# Patient Record
Sex: Male | Born: 1945 | Race: White | Hispanic: No | Smoking: Never smoker
Health system: Southern US, Community
[De-identification: ages and names within clinical notes are randomized; demographics above are authoritative.]

## PROBLEM LIST (undated history)

## (undated) DIAGNOSIS — J449 Chronic obstructive pulmonary disease, unspecified: Secondary | ICD-10-CM

## (undated) DIAGNOSIS — T7840XA Allergy, unspecified, initial encounter: Secondary | ICD-10-CM

## (undated) DIAGNOSIS — Z860100 Personal history of colon polyps, unspecified: Secondary | ICD-10-CM

## (undated) DIAGNOSIS — K219 Gastro-esophageal reflux disease without esophagitis: Secondary | ICD-10-CM

## (undated) DIAGNOSIS — Z809 Family history of malignant neoplasm, unspecified: Secondary | ICD-10-CM

## (undated) DIAGNOSIS — M1990 Unspecified osteoarthritis, unspecified site: Secondary | ICD-10-CM

## (undated) DIAGNOSIS — F419 Anxiety disorder, unspecified: Secondary | ICD-10-CM

## (undated) DIAGNOSIS — I509 Heart failure, unspecified: Secondary | ICD-10-CM

## (undated) DIAGNOSIS — M542 Cervicalgia: Secondary | ICD-10-CM

## (undated) DIAGNOSIS — F1110 Opioid abuse, uncomplicated: Secondary | ICD-10-CM

## (undated) DIAGNOSIS — C4372 Malignant melanoma of left lower limb, including hip: Secondary | ICD-10-CM

## (undated) DIAGNOSIS — R112 Nausea with vomiting, unspecified: Secondary | ICD-10-CM

## (undated) DIAGNOSIS — F329 Major depressive disorder, single episode, unspecified: Secondary | ICD-10-CM

## (undated) DIAGNOSIS — I219 Acute myocardial infarction, unspecified: Secondary | ICD-10-CM

## (undated) DIAGNOSIS — F1020 Alcohol dependence, uncomplicated: Secondary | ICD-10-CM

## (undated) DIAGNOSIS — D500 Iron deficiency anemia secondary to blood loss (chronic): Secondary | ICD-10-CM

## (undated) DIAGNOSIS — K5790 Diverticulosis of intestine, part unspecified, without perforation or abscess without bleeding: Secondary | ICD-10-CM

## (undated) DIAGNOSIS — I2510 Atherosclerotic heart disease of native coronary artery without angina pectoris: Secondary | ICD-10-CM

## (undated) DIAGNOSIS — A499 Bacterial infection, unspecified: Secondary | ICD-10-CM

## (undated) DIAGNOSIS — R600 Localized edema: Secondary | ICD-10-CM

## (undated) DIAGNOSIS — Z9889 Other specified postprocedural states: Secondary | ICD-10-CM

## (undated) DIAGNOSIS — G8929 Other chronic pain: Secondary | ICD-10-CM

## (undated) DIAGNOSIS — M069 Rheumatoid arthritis, unspecified: Secondary | ICD-10-CM

## (undated) DIAGNOSIS — IMO0001 Reserved for inherently not codable concepts without codable children: Secondary | ICD-10-CM

## (undated) DIAGNOSIS — E7800 Pure hypercholesterolemia, unspecified: Secondary | ICD-10-CM

## (undated) DIAGNOSIS — M549 Dorsalgia, unspecified: Secondary | ICD-10-CM

## (undated) DIAGNOSIS — R296 Repeated falls: Secondary | ICD-10-CM

## (undated) DIAGNOSIS — F32A Depression, unspecified: Secondary | ICD-10-CM

## (undated) DIAGNOSIS — I10 Essential (primary) hypertension: Secondary | ICD-10-CM

## (undated) DIAGNOSIS — I499 Cardiac arrhythmia, unspecified: Secondary | ICD-10-CM

## (undated) DIAGNOSIS — G629 Polyneuropathy, unspecified: Secondary | ICD-10-CM

## (undated) DIAGNOSIS — Z1379 Encounter for other screening for genetic and chromosomal anomalies: Principal | ICD-10-CM

## (undated) DIAGNOSIS — R609 Edema, unspecified: Secondary | ICD-10-CM

## (undated) DIAGNOSIS — H269 Unspecified cataract: Secondary | ICD-10-CM

## (undated) DIAGNOSIS — R0600 Dyspnea, unspecified: Secondary | ICD-10-CM

## (undated) HISTORY — PX: INGUINAL HERNIA REPAIR: SUR1180

## (undated) HISTORY — PX: CARPAL TUNNEL RELEASE: SHX101

## (undated) HISTORY — PX: BONE TUMOR RESECTION: SHX1255

## (undated) HISTORY — DX: Essential (primary) hypertension: I10

## (undated) HISTORY — PX: JOINT REPLACEMENT: SHX530

## (undated) HISTORY — DX: Iron deficiency anemia secondary to blood loss (chronic): D50.0

## (undated) HISTORY — DX: Hemochromatosis, unspecified: E83.119

## (undated) HISTORY — DX: Other specified postprocedural states: Z98.890

## (undated) HISTORY — DX: Encounter for other screening for genetic and chromosomal anomalies: Z13.79

## (undated) HISTORY — DX: Unspecified cataract: H26.9

## (undated) HISTORY — DX: Chronic obstructive pulmonary disease, unspecified: J44.9

## (undated) HISTORY — PX: REPLACEMENT UNICONDYLAR JOINT KNEE: SUR1227

## (undated) HISTORY — DX: Diverticulosis of intestine, part unspecified, without perforation or abscess without bleeding: K57.90

## (undated) HISTORY — PX: SHOULDER ARTHROSCOPY: SHX128

## (undated) HISTORY — DX: Atherosclerotic heart disease of native coronary artery without angina pectoris: I25.10

## (undated) HISTORY — DX: Bacterial infection, unspecified: A49.9

## (undated) HISTORY — DX: Allergy, unspecified, initial encounter: T78.40XA

## (undated) HISTORY — PX: MECKEL DIVERTICULUM EXCISION: SHX314

## (undated) HISTORY — DX: Rheumatoid arthritis, unspecified: M06.9

## (undated) HISTORY — DX: Family history of malignant neoplasm, unspecified: Z80.9

## (undated) HISTORY — PX: ABDOMINAL ADHESION SURGERY: SHX90

## (undated) HISTORY — DX: Personal history of colonic polyps: Z86.010

## (undated) HISTORY — DX: Heart failure, unspecified: I50.9

## (undated) HISTORY — PX: TOTAL SHOULDER REPLACEMENT: SUR1217

## (undated) HISTORY — DX: Unspecified osteoarthritis, unspecified site: M19.90

## (undated) HISTORY — PX: APPENDECTOMY: SHX54

## (undated) HISTORY — DX: Personal history of colon polyps, unspecified: Z86.0100

## (undated) HISTORY — PX: KNEE SURGERY: SHX244

## (undated) HISTORY — PX: CYST EXCISION: SHX5701

---

## 1997-11-25 ENCOUNTER — Other Ambulatory Visit: Admission: RE | Admit: 1997-11-25 | Discharge: 1997-11-25 | Payer: Self-pay | Admitting: Orthopedic Surgery

## 1998-09-15 ENCOUNTER — Ambulatory Visit (HOSPITAL_COMMUNITY): Admission: RE | Admit: 1998-09-15 | Discharge: 1998-09-15 | Payer: Self-pay | Admitting: Gastroenterology

## 1998-09-18 ENCOUNTER — Ambulatory Visit (HOSPITAL_COMMUNITY): Admission: RE | Admit: 1998-09-18 | Discharge: 1998-09-18 | Payer: Self-pay | Admitting: Gastroenterology

## 1999-08-10 ENCOUNTER — Encounter: Admission: RE | Admit: 1999-08-10 | Discharge: 1999-08-10 | Payer: Self-pay | Admitting: *Deleted

## 1999-08-10 ENCOUNTER — Encounter: Payer: Self-pay | Admitting: *Deleted

## 1999-10-05 ENCOUNTER — Other Ambulatory Visit: Admission: RE | Admit: 1999-10-05 | Discharge: 1999-10-05 | Payer: Self-pay | Admitting: Orthopedic Surgery

## 2000-04-08 HISTORY — PX: TOTAL KNEE ARTHROPLASTY: SHX125

## 2001-09-18 ENCOUNTER — Encounter: Payer: Self-pay | Admitting: Orthopedic Surgery

## 2001-09-24 ENCOUNTER — Inpatient Hospital Stay (HOSPITAL_COMMUNITY): Admission: RE | Admit: 2001-09-24 | Discharge: 2001-09-26 | Payer: Self-pay | Admitting: Orthopedic Surgery

## 2001-09-24 ENCOUNTER — Encounter: Payer: Self-pay | Admitting: Orthopedic Surgery

## 2001-10-03 ENCOUNTER — Emergency Department (HOSPITAL_COMMUNITY): Admission: EM | Admit: 2001-10-03 | Discharge: 2001-10-03 | Payer: Self-pay | Admitting: Emergency Medicine

## 2001-12-24 ENCOUNTER — Encounter (INDEPENDENT_AMBULATORY_CARE_PROVIDER_SITE_OTHER): Payer: Self-pay | Admitting: Specialist

## 2001-12-24 ENCOUNTER — Inpatient Hospital Stay (HOSPITAL_COMMUNITY): Admission: RE | Admit: 2001-12-24 | Discharge: 2001-12-26 | Payer: Self-pay | Admitting: Orthopedic Surgery

## 2003-02-04 ENCOUNTER — Encounter (HOSPITAL_COMMUNITY): Admission: RE | Admit: 2003-02-04 | Discharge: 2003-05-05 | Payer: Self-pay | Admitting: Gastroenterology

## 2003-03-16 ENCOUNTER — Inpatient Hospital Stay (HOSPITAL_COMMUNITY): Admission: RE | Admit: 2003-03-16 | Discharge: 2003-03-21 | Payer: Self-pay | Admitting: Orthopedic Surgery

## 2003-09-01 ENCOUNTER — Encounter (HOSPITAL_COMMUNITY): Admission: RE | Admit: 2003-09-01 | Discharge: 2003-11-30 | Payer: Self-pay | Admitting: Gastroenterology

## 2003-09-14 LAB — HM COLONOSCOPY: HM Colonoscopy: NORMAL

## 2004-01-23 ENCOUNTER — Encounter: Admission: RE | Admit: 2004-01-23 | Discharge: 2004-01-23 | Payer: Self-pay | Admitting: Internal Medicine

## 2004-04-08 DIAGNOSIS — I219 Acute myocardial infarction, unspecified: Secondary | ICD-10-CM

## 2004-04-08 HISTORY — PX: TOTAL SHOULDER REPLACEMENT: SUR1217

## 2004-04-08 HISTORY — PX: CORONARY ANGIOPLASTY WITH STENT PLACEMENT: SHX49

## 2004-04-08 HISTORY — DX: Acute myocardial infarction, unspecified: I21.9

## 2004-10-15 ENCOUNTER — Ambulatory Visit (HOSPITAL_COMMUNITY): Admission: RE | Admit: 2004-10-15 | Discharge: 2004-10-17 | Payer: Self-pay | Admitting: *Deleted

## 2004-12-17 ENCOUNTER — Encounter: Payer: Self-pay | Admitting: Internal Medicine

## 2005-06-27 ENCOUNTER — Encounter: Admission: RE | Admit: 2005-06-27 | Discharge: 2005-06-27 | Payer: Self-pay | Admitting: Family Medicine

## 2006-04-08 HISTORY — PX: TOTAL ANKLE REPLACEMENT: SUR1218

## 2006-04-28 ENCOUNTER — Encounter: Payer: Self-pay | Admitting: Internal Medicine

## 2006-06-23 ENCOUNTER — Ambulatory Visit (HOSPITAL_COMMUNITY): Admission: RE | Admit: 2006-06-23 | Discharge: 2006-06-23 | Payer: Self-pay | Admitting: Surgery

## 2006-06-23 ENCOUNTER — Encounter (INDEPENDENT_AMBULATORY_CARE_PROVIDER_SITE_OTHER): Payer: Self-pay | Admitting: *Deleted

## 2006-06-30 ENCOUNTER — Encounter: Payer: Self-pay | Admitting: Internal Medicine

## 2006-08-12 ENCOUNTER — Inpatient Hospital Stay (HOSPITAL_COMMUNITY): Admission: RE | Admit: 2006-08-12 | Discharge: 2006-08-15 | Payer: Self-pay | Admitting: Orthopedic Surgery

## 2006-08-25 ENCOUNTER — Encounter: Payer: Self-pay | Admitting: Internal Medicine

## 2006-11-17 ENCOUNTER — Encounter: Payer: Self-pay | Admitting: Internal Medicine

## 2006-12-09 ENCOUNTER — Ambulatory Visit (HOSPITAL_COMMUNITY): Admission: RE | Admit: 2006-12-09 | Discharge: 2006-12-09 | Payer: Self-pay | Admitting: *Deleted

## 2006-12-22 ENCOUNTER — Encounter: Payer: Self-pay | Admitting: Internal Medicine

## 2007-01-22 ENCOUNTER — Encounter: Payer: Self-pay | Admitting: Internal Medicine

## 2007-02-07 HISTORY — PX: FOOT SURGERY: SHX648

## 2007-03-10 ENCOUNTER — Encounter: Payer: Self-pay | Admitting: Internal Medicine

## 2007-03-16 ENCOUNTER — Encounter: Payer: Self-pay | Admitting: Internal Medicine

## 2007-03-27 ENCOUNTER — Encounter: Payer: Self-pay | Admitting: Internal Medicine

## 2007-03-30 ENCOUNTER — Ambulatory Visit: Payer: Self-pay | Admitting: Internal Medicine

## 2007-03-30 DIAGNOSIS — Z860100 Personal history of colon polyps, unspecified: Secondary | ICD-10-CM | POA: Insufficient documentation

## 2007-03-30 DIAGNOSIS — I2510 Atherosclerotic heart disease of native coronary artery without angina pectoris: Secondary | ICD-10-CM | POA: Insufficient documentation

## 2007-03-30 DIAGNOSIS — I10 Essential (primary) hypertension: Secondary | ICD-10-CM | POA: Insufficient documentation

## 2007-03-30 DIAGNOSIS — J309 Allergic rhinitis, unspecified: Secondary | ICD-10-CM | POA: Insufficient documentation

## 2007-03-30 DIAGNOSIS — E785 Hyperlipidemia, unspecified: Secondary | ICD-10-CM | POA: Insufficient documentation

## 2007-03-30 DIAGNOSIS — M159 Polyosteoarthritis, unspecified: Secondary | ICD-10-CM | POA: Insufficient documentation

## 2007-03-30 DIAGNOSIS — Z862 Personal history of diseases of the blood and blood-forming organs and certain disorders involving the immune mechanism: Secondary | ICD-10-CM | POA: Insufficient documentation

## 2007-03-30 DIAGNOSIS — Z8719 Personal history of other diseases of the digestive system: Secondary | ICD-10-CM | POA: Insufficient documentation

## 2007-04-06 ENCOUNTER — Encounter: Payer: Self-pay | Admitting: Internal Medicine

## 2007-04-08 ENCOUNTER — Ambulatory Visit: Payer: Self-pay | Admitting: Internal Medicine

## 2007-04-08 LAB — CONVERTED CEMR LAB
ALT: 16 units/L (ref 0–53)
AST: 15 units/L (ref 0–37)
Albumin: 4.2 g/dL (ref 3.5–5.2)
Alkaline Phosphatase: 73 units/L (ref 39–117)
BUN: 10 mg/dL (ref 6–23)
Basophils Absolute: 0 10*3/uL (ref 0.0–0.1)
Basophils Relative: 0.8 % (ref 0.0–1.0)
Bilirubin, Direct: 0.2 mg/dL (ref 0.0–0.3)
CO2: 32 meq/L (ref 19–32)
Calcium: 9.6 mg/dL (ref 8.4–10.5)
Chloride: 97 meq/L (ref 96–112)
Cholesterol: 132 mg/dL (ref 0–200)
Creatinine, Ser: 0.6 mg/dL (ref 0.4–1.5)
Eosinophils Absolute: 0.2 10*3/uL (ref 0.0–0.6)
Eosinophils Relative: 3 % (ref 0.0–5.0)
GFR calc Af Amer: 177 mL/min
GFR calc non Af Amer: 147 mL/min
Glucose, Bld: 108 mg/dL — ABNORMAL HIGH (ref 70–99)
HCT: 45.6 % (ref 39.0–52.0)
HDL: 44 mg/dL (ref >39.0)
Hemoglobin: 16.1 g/dL (ref 13.0–17.0)
Iron: 109 ug/dL (ref 42–165)
LDL Cholesterol: 80 mg/dL (ref 0–99)
Lymphocytes Relative: 20.7 % (ref 12.0–46.0)
MCHC: 35.2 g/dL (ref 30.0–36.0)
MCV: 96 fL (ref 78.0–100.0)
Monocytes Absolute: 0.9 10*3/uL — ABNORMAL HIGH (ref 0.2–0.7)
Monocytes Relative: 15.1 % — ABNORMAL HIGH (ref 3.0–11.0)
Neutro Abs: 3.7 10*3/uL (ref 1.4–7.7)
Neutrophils Relative %: 60.4 % (ref 43.0–77.0)
Platelets: 317 10*3/uL (ref 150–400)
Potassium: 4 meq/L (ref 3.5–5.1)
RBC: 4.75 M/uL (ref 4.22–5.81)
RDW: 12.2 % (ref 11.5–14.6)
Sodium: 137 meq/L (ref 135–145)
Total Bilirubin: 0.8 mg/dL (ref 0.3–1.2)
Total CHOL/HDL Ratio: 3
Total Protein: 7.6 g/dL (ref 6.0–8.3)
Triglycerides: 38 mg/dL (ref 0–149)
VLDL: 8 mg/dL (ref 0–40)
Vitamin B-12: 478 pg/mL (ref 211–911)
WBC: 6 10*3/uL (ref 4.5–10.5)

## 2007-04-11 ENCOUNTER — Encounter: Payer: Self-pay | Admitting: Internal Medicine

## 2007-04-15 ENCOUNTER — Telehealth: Payer: Self-pay | Admitting: Internal Medicine

## 2007-04-17 ENCOUNTER — Ambulatory Visit: Payer: Self-pay | Admitting: Internal Medicine

## 2007-04-17 DIAGNOSIS — M79609 Pain in unspecified limb: Secondary | ICD-10-CM | POA: Insufficient documentation

## 2007-04-17 LAB — CONVERTED CEMR LAB: Uric Acid, Serum: 3.8 mg/dL (ref 2.4–7.0)

## 2007-04-20 ENCOUNTER — Telehealth: Payer: Self-pay | Admitting: Internal Medicine

## 2007-05-21 ENCOUNTER — Ambulatory Visit (HOSPITAL_BASED_OUTPATIENT_CLINIC_OR_DEPARTMENT_OTHER): Admission: RE | Admit: 2007-05-21 | Discharge: 2007-05-21 | Payer: Self-pay | Admitting: Orthopedic Surgery

## 2007-05-21 ENCOUNTER — Encounter (INDEPENDENT_AMBULATORY_CARE_PROVIDER_SITE_OTHER): Payer: Self-pay | Admitting: Orthopedic Surgery

## 2007-06-26 ENCOUNTER — Ambulatory Visit: Payer: Self-pay | Admitting: Internal Medicine

## 2007-09-07 HISTORY — PX: ANKLE RECONSTRUCTION: SHX1151

## 2007-09-08 ENCOUNTER — Telehealth: Payer: Self-pay | Admitting: Internal Medicine

## 2007-10-06 ENCOUNTER — Telehealth: Payer: Self-pay | Admitting: Internal Medicine

## 2007-11-09 ENCOUNTER — Ambulatory Visit: Payer: Self-pay | Admitting: Internal Medicine

## 2007-11-09 DIAGNOSIS — F419 Anxiety disorder, unspecified: Secondary | ICD-10-CM | POA: Insufficient documentation

## 2007-12-03 ENCOUNTER — Encounter: Payer: Self-pay | Admitting: Internal Medicine

## 2007-12-04 ENCOUNTER — Encounter: Payer: Self-pay | Admitting: Internal Medicine

## 2007-12-11 ENCOUNTER — Telehealth: Payer: Self-pay | Admitting: Endocrinology

## 2007-12-15 ENCOUNTER — Ambulatory Visit: Payer: Self-pay | Admitting: Internal Medicine

## 2007-12-15 LAB — CONVERTED CEMR LAB
Chlamydia, Swab/Urine, PCR: NEGATIVE
GC Probe Amp, Urine: NEGATIVE

## 2007-12-17 ENCOUNTER — Encounter: Payer: Self-pay | Admitting: Internal Medicine

## 2007-12-25 ENCOUNTER — Telehealth: Payer: Self-pay | Admitting: Internal Medicine

## 2008-01-01 ENCOUNTER — Ambulatory Visit: Payer: Self-pay | Admitting: Internal Medicine

## 2008-01-07 ENCOUNTER — Encounter: Payer: Self-pay | Admitting: Internal Medicine

## 2008-01-08 ENCOUNTER — Ambulatory Visit: Payer: Self-pay | Admitting: Internal Medicine

## 2008-01-11 ENCOUNTER — Encounter: Payer: Self-pay | Admitting: Internal Medicine

## 2008-01-18 ENCOUNTER — Encounter: Payer: Self-pay | Admitting: Internal Medicine

## 2008-02-05 ENCOUNTER — Encounter: Payer: Self-pay | Admitting: Internal Medicine

## 2008-02-23 ENCOUNTER — Telehealth: Payer: Self-pay | Admitting: Internal Medicine

## 2008-02-29 ENCOUNTER — Encounter: Payer: Self-pay | Admitting: Internal Medicine

## 2008-04-15 ENCOUNTER — Telehealth: Payer: Self-pay | Admitting: Internal Medicine

## 2008-04-18 ENCOUNTER — Telehealth: Payer: Self-pay | Admitting: Internal Medicine

## 2008-04-28 ENCOUNTER — Telehealth (INDEPENDENT_AMBULATORY_CARE_PROVIDER_SITE_OTHER): Payer: Self-pay | Admitting: *Deleted

## 2008-05-02 ENCOUNTER — Encounter: Payer: Self-pay | Admitting: Internal Medicine

## 2008-05-02 ENCOUNTER — Telehealth: Payer: Self-pay | Admitting: Internal Medicine

## 2008-05-05 ENCOUNTER — Encounter: Payer: Self-pay | Admitting: Internal Medicine

## 2008-05-11 ENCOUNTER — Encounter: Payer: Self-pay | Admitting: Internal Medicine

## 2008-06-06 ENCOUNTER — Telehealth: Payer: Self-pay | Admitting: Internal Medicine

## 2008-06-21 ENCOUNTER — Telehealth: Payer: Self-pay | Admitting: Internal Medicine

## 2008-07-04 ENCOUNTER — Encounter: Payer: Self-pay | Admitting: Internal Medicine

## 2008-07-18 ENCOUNTER — Encounter: Payer: Self-pay | Admitting: Internal Medicine

## 2008-08-29 ENCOUNTER — Telehealth: Payer: Self-pay | Admitting: Internal Medicine

## 2008-10-03 ENCOUNTER — Telehealth: Payer: Self-pay | Admitting: Internal Medicine

## 2008-10-11 ENCOUNTER — Ambulatory Visit: Payer: Self-pay | Admitting: Internal Medicine

## 2008-10-11 ENCOUNTER — Telehealth: Payer: Self-pay | Admitting: Internal Medicine

## 2008-10-11 LAB — CONVERTED CEMR LAB
ALT: 14 units/L (ref 0–53)
AST: 17 units/L (ref 0–37)
Albumin: 4.1 g/dL (ref 3.5–5.2)
Alkaline Phosphatase: 76 units/L (ref 39–117)
BUN: 12 mg/dL (ref 6–23)
Basophils Absolute: 0 10*3/uL (ref 0.0–0.1)
Basophils Relative: 0.2 % (ref 0.0–3.0)
Bilirubin Urine: NEGATIVE
Bilirubin, Direct: 0.2 mg/dL (ref 0.0–0.3)
CO2: 33 meq/L — ABNORMAL HIGH (ref 19–32)
Calcium: 8.9 mg/dL (ref 8.4–10.5)
Chloride: 97 meq/L (ref 96–112)
Cholesterol: 144 mg/dL (ref 0–200)
Creatinine, Ser: 0.7 mg/dL (ref 0.4–1.5)
Eosinophils Absolute: 0.1 10*3/uL (ref 0.0–0.7)
Eosinophils Relative: 2.4 % (ref 0.0–5.0)
Ferritin: 43 ng/mL (ref 22.0–322.0)
GFR calc non Af Amer: 121.67 mL/min (ref >60)
Glucose, Bld: 112 mg/dL — ABNORMAL HIGH (ref 70–99)
HCT: 43.2 % (ref 39.0–52.0)
HDL: 49 mg/dL (ref >39.00)
Hemoglobin, Urine: NEGATIVE
Hemoglobin: 15 g/dL (ref 13.0–17.0)
Ketones, ur: NEGATIVE mg/dL
LDL Cholesterol: 89 mg/dL (ref 0–99)
Leukocytes, UA: NEGATIVE
Lymphocytes Relative: 20.2 % (ref 12.0–46.0)
Lymphs Abs: 0.8 10*3/uL (ref 0.7–4.0)
MCHC: 34.6 g/dL (ref 30.0–36.0)
MCV: 93.2 fL (ref 78.0–100.0)
Monocytes Absolute: 0.4 10*3/uL (ref 0.1–1.0)
Monocytes Relative: 10.3 % (ref 3.0–12.0)
Neutro Abs: 2.7 10*3/uL (ref 1.4–7.7)
Neutrophils Relative %: 66.9 % (ref 43.0–77.0)
Nitrite: NEGATIVE
PSA: 1.64 ng/mL (ref 0.10–4.00)
Platelets: 206 10*3/uL (ref 150.0–400.0)
Potassium: 4.1 meq/L (ref 3.5–5.1)
RBC: 4.64 M/uL (ref 4.22–5.81)
RDW: 12.7 % (ref 11.5–14.6)
Sodium: 138 meq/L (ref 135–145)
Specific Gravity, Urine: <=1.005 (ref 1.000–1.030)
TSH: 0.54 microintl units/mL (ref 0.35–5.50)
Total Bilirubin: 1 mg/dL (ref 0.3–1.2)
Total CHOL/HDL Ratio: 3
Total Protein, Urine: NEGATIVE mg/dL
Total Protein: 6.9 g/dL (ref 6.0–8.3)
Triglycerides: 28 mg/dL (ref 0.0–149.0)
Urine Glucose: NEGATIVE mg/dL
Urobilinogen, UA: 0.2 (ref 0.0–1.0)
VLDL: 5.6 mg/dL (ref 0.0–40.0)
WBC: 4 10*3/uL — ABNORMAL LOW (ref 4.5–10.5)
pH: 6.5 (ref 5.0–8.0)

## 2008-10-13 ENCOUNTER — Encounter: Payer: Self-pay | Admitting: Internal Medicine

## 2008-10-17 ENCOUNTER — Ambulatory Visit: Payer: Self-pay | Admitting: Internal Medicine

## 2008-10-17 ENCOUNTER — Telehealth: Payer: Self-pay | Admitting: Internal Medicine

## 2008-10-18 DIAGNOSIS — M069 Rheumatoid arthritis, unspecified: Secondary | ICD-10-CM | POA: Insufficient documentation

## 2008-10-24 ENCOUNTER — Encounter: Payer: Self-pay | Admitting: Internal Medicine

## 2008-10-26 ENCOUNTER — Telehealth: Payer: Self-pay | Admitting: Internal Medicine

## 2008-10-26 DIAGNOSIS — M51379 Other intervertebral disc degeneration, lumbosacral region without mention of lumbar back pain or lower extremity pain: Secondary | ICD-10-CM | POA: Insufficient documentation

## 2008-10-26 DIAGNOSIS — M5137 Other intervertebral disc degeneration, lumbosacral region: Secondary | ICD-10-CM | POA: Insufficient documentation

## 2008-10-31 ENCOUNTER — Telehealth: Payer: Self-pay | Admitting: Internal Medicine

## 2008-11-03 ENCOUNTER — Telehealth (INDEPENDENT_AMBULATORY_CARE_PROVIDER_SITE_OTHER): Payer: Self-pay | Admitting: *Deleted

## 2008-11-03 ENCOUNTER — Encounter: Payer: Self-pay | Admitting: Internal Medicine

## 2008-11-07 ENCOUNTER — Encounter: Admission: RE | Admit: 2008-11-07 | Discharge: 2008-11-07 | Payer: Self-pay | Admitting: Internal Medicine

## 2008-11-18 ENCOUNTER — Telehealth: Payer: Self-pay | Admitting: Internal Medicine

## 2008-11-30 ENCOUNTER — Encounter: Payer: Self-pay | Admitting: Internal Medicine

## 2008-12-01 ENCOUNTER — Encounter: Payer: Self-pay | Admitting: Internal Medicine

## 2008-12-01 ENCOUNTER — Encounter: Admission: RE | Admit: 2008-12-01 | Discharge: 2008-12-01 | Payer: Self-pay | Admitting: Orthopedic Surgery

## 2008-12-08 ENCOUNTER — Encounter: Payer: Self-pay | Admitting: Internal Medicine

## 2008-12-13 ENCOUNTER — Telehealth: Payer: Self-pay | Admitting: Internal Medicine

## 2009-01-03 ENCOUNTER — Telehealth: Payer: Self-pay | Admitting: Internal Medicine

## 2009-01-09 ENCOUNTER — Telehealth (INDEPENDENT_AMBULATORY_CARE_PROVIDER_SITE_OTHER): Payer: Self-pay | Admitting: *Deleted

## 2009-01-16 ENCOUNTER — Telehealth: Payer: Self-pay | Admitting: Internal Medicine

## 2009-02-14 ENCOUNTER — Ambulatory Visit: Payer: Self-pay | Admitting: Cardiology

## 2009-03-08 HISTORY — PX: POSTERIOR LUMBAR FUSION: SHX6036

## 2009-03-15 ENCOUNTER — Telehealth: Payer: Self-pay | Admitting: Internal Medicine

## 2009-03-21 ENCOUNTER — Inpatient Hospital Stay (HOSPITAL_COMMUNITY): Admission: RE | Admit: 2009-03-21 | Discharge: 2009-03-31 | Payer: Self-pay | Admitting: Neurosurgery

## 2009-03-27 ENCOUNTER — Ambulatory Visit: Payer: Self-pay | Admitting: Physical Medicine & Rehabilitation

## 2009-04-06 ENCOUNTER — Encounter: Admission: RE | Admit: 2009-04-06 | Discharge: 2009-04-06 | Payer: Self-pay | Admitting: Neurosurgery

## 2009-04-06 ENCOUNTER — Ambulatory Visit: Payer: Self-pay | Admitting: Internal Medicine

## 2009-04-06 DIAGNOSIS — IMO0002 Reserved for concepts with insufficient information to code with codable children: Secondary | ICD-10-CM | POA: Insufficient documentation

## 2009-04-07 ENCOUNTER — Encounter: Payer: Self-pay | Admitting: Internal Medicine

## 2009-04-07 ENCOUNTER — Ambulatory Visit (HOSPITAL_COMMUNITY): Admission: RE | Admit: 2009-04-07 | Discharge: 2009-04-07 | Payer: Self-pay | Admitting: Internal Medicine

## 2009-04-07 ENCOUNTER — Ambulatory Visit: Payer: Self-pay | Admitting: Vascular Surgery

## 2009-04-10 ENCOUNTER — Ambulatory Visit: Payer: Self-pay | Admitting: Internal Medicine

## 2009-04-10 DIAGNOSIS — I872 Venous insufficiency (chronic) (peripheral): Secondary | ICD-10-CM | POA: Insufficient documentation

## 2009-04-13 ENCOUNTER — Encounter: Admission: RE | Admit: 2009-04-13 | Discharge: 2009-06-27 | Payer: Self-pay | Admitting: Neurosurgery

## 2009-04-20 ENCOUNTER — Encounter: Payer: Self-pay | Admitting: Internal Medicine

## 2009-04-28 ENCOUNTER — Telehealth: Payer: Self-pay | Admitting: Internal Medicine

## 2009-05-01 ENCOUNTER — Telehealth: Payer: Self-pay | Admitting: Internal Medicine

## 2009-05-05 ENCOUNTER — Telehealth (INDEPENDENT_AMBULATORY_CARE_PROVIDER_SITE_OTHER): Payer: Self-pay | Admitting: *Deleted

## 2009-05-17 ENCOUNTER — Ambulatory Visit: Payer: Self-pay | Admitting: Internal Medicine

## 2009-06-09 ENCOUNTER — Encounter: Payer: Self-pay | Admitting: Internal Medicine

## 2009-06-09 ENCOUNTER — Ambulatory Visit: Payer: Self-pay | Admitting: Internal Medicine

## 2009-06-09 ENCOUNTER — Telehealth: Payer: Self-pay | Admitting: Internal Medicine

## 2009-06-12 ENCOUNTER — Encounter: Payer: Self-pay | Admitting: Internal Medicine

## 2009-06-26 ENCOUNTER — Telehealth: Payer: Self-pay | Admitting: Internal Medicine

## 2009-07-17 ENCOUNTER — Telehealth: Payer: Self-pay | Admitting: Internal Medicine

## 2009-10-06 ENCOUNTER — Ambulatory Visit: Payer: Self-pay | Admitting: Internal Medicine

## 2009-10-06 LAB — CONVERTED CEMR LAB
ALT: 16 units/L (ref 0–53)
AST: 17 units/L (ref 0–37)
Albumin: 3.8 g/dL (ref 3.5–5.2)
Alkaline Phosphatase: 79 units/L (ref 39–117)
BUN: 12 mg/dL (ref 6–23)
Basophils Absolute: 0 10*3/uL (ref 0.0–0.1)
Basophils Relative: 0.9 % (ref 0.0–3.0)
Bilirubin Urine: NEGATIVE
Bilirubin, Direct: 0.2 mg/dL (ref 0.0–0.3)
CO2: 30 meq/L (ref 19–32)
Calcium: 8.7 mg/dL (ref 8.4–10.5)
Chloride: 102 meq/L (ref 96–112)
Cholesterol: 134 mg/dL (ref 0–200)
Creatinine, Ser: 0.6 mg/dL (ref 0.4–1.5)
Eosinophils Absolute: 0.1 10*3/uL (ref 0.0–0.7)
Eosinophils Relative: 2.4 % (ref 0.0–5.0)
GFR calc non Af Amer: 144.88 mL/min (ref >60)
Glucose, Bld: 101 mg/dL — ABNORMAL HIGH (ref 70–99)
HCT: 39.3 % (ref 39.0–52.0)
HDL: 43.9 mg/dL (ref >39.00)
Hemoglobin, Urine: NEGATIVE
Hemoglobin: 13.5 g/dL (ref 13.0–17.0)
Ketones, ur: NEGATIVE mg/dL
LDL Cholesterol: 82 mg/dL (ref 0–99)
Leukocytes, UA: NEGATIVE
Lymphocytes Relative: 18.5 % (ref 12.0–46.0)
Lymphs Abs: 0.8 10*3/uL (ref 0.7–4.0)
MCHC: 34.3 g/dL (ref 30.0–36.0)
MCV: 93.1 fL (ref 78.0–100.0)
Monocytes Absolute: 0.5 10*3/uL (ref 0.1–1.0)
Monocytes Relative: 11.2 % (ref 3.0–12.0)
Neutro Abs: 2.9 10*3/uL (ref 1.4–7.7)
Neutrophils Relative %: 67 % (ref 43.0–77.0)
Nitrite: NEGATIVE
PSA: 1.77 ng/mL (ref 0.10–4.00)
Platelets: 271 10*3/uL (ref 150.0–400.0)
Potassium: 4.1 meq/L (ref 3.5–5.1)
RBC: 4.22 M/uL (ref 4.22–5.81)
RDW: 15.4 % — ABNORMAL HIGH (ref 11.5–14.6)
Sodium: 139 meq/L (ref 135–145)
Specific Gravity, Urine: <=1.005 (ref 1.000–1.030)
TSH: 0.6 microintl units/mL (ref 0.35–5.50)
Total Bilirubin: 0.6 mg/dL (ref 0.3–1.2)
Total CHOL/HDL Ratio: 3
Total Protein, Urine: NEGATIVE mg/dL
Total Protein: 6.5 g/dL (ref 6.0–8.3)
Triglycerides: 40 mg/dL (ref 0.0–149.0)
Urine Glucose: NEGATIVE mg/dL
Urobilinogen, UA: 0.2 (ref 0.0–1.0)
VLDL: 8 mg/dL (ref 0.0–40.0)
WBC: 4.3 10*3/uL — ABNORMAL LOW (ref 4.5–10.5)
pH: 7 (ref 5.0–8.0)

## 2009-10-13 ENCOUNTER — Encounter: Payer: Self-pay | Admitting: Internal Medicine

## 2009-10-16 ENCOUNTER — Ambulatory Visit: Payer: Self-pay | Admitting: Internal Medicine

## 2009-11-10 ENCOUNTER — Telehealth: Payer: Self-pay | Admitting: Internal Medicine

## 2009-11-17 ENCOUNTER — Telehealth: Payer: Self-pay | Admitting: Internal Medicine

## 2009-12-19 ENCOUNTER — Encounter: Payer: Self-pay | Admitting: Internal Medicine

## 2010-01-08 ENCOUNTER — Telehealth: Payer: Self-pay | Admitting: Internal Medicine

## 2010-01-10 ENCOUNTER — Ambulatory Visit: Payer: Self-pay | Admitting: Internal Medicine

## 2010-01-10 ENCOUNTER — Encounter: Payer: Self-pay | Admitting: Internal Medicine

## 2010-01-10 DIAGNOSIS — J449 Chronic obstructive pulmonary disease, unspecified: Secondary | ICD-10-CM | POA: Insufficient documentation

## 2010-01-10 DIAGNOSIS — J4489 Other specified chronic obstructive pulmonary disease: Secondary | ICD-10-CM | POA: Insufficient documentation

## 2010-01-12 ENCOUNTER — Telehealth: Payer: Self-pay | Admitting: Internal Medicine

## 2010-01-19 ENCOUNTER — Telehealth: Payer: Self-pay | Admitting: Internal Medicine

## 2010-01-23 ENCOUNTER — Ambulatory Visit: Payer: Self-pay | Admitting: Internal Medicine

## 2010-01-23 LAB — PULMONARY FUNCTION TEST

## 2010-01-25 ENCOUNTER — Encounter: Payer: Self-pay | Admitting: Internal Medicine

## 2010-01-26 ENCOUNTER — Telehealth: Payer: Self-pay | Admitting: Internal Medicine

## 2010-02-09 ENCOUNTER — Encounter: Payer: Self-pay | Admitting: Internal Medicine

## 2010-02-12 ENCOUNTER — Encounter: Payer: Self-pay | Admitting: Internal Medicine

## 2010-02-14 ENCOUNTER — Encounter: Payer: Self-pay | Admitting: Internal Medicine

## 2010-02-14 ENCOUNTER — Telehealth (INDEPENDENT_AMBULATORY_CARE_PROVIDER_SITE_OTHER): Payer: Self-pay | Admitting: *Deleted

## 2010-04-06 ENCOUNTER — Encounter: Payer: Self-pay | Admitting: Internal Medicine

## 2010-04-18 ENCOUNTER — Telehealth: Payer: Self-pay | Admitting: Internal Medicine

## 2010-04-30 ENCOUNTER — Ambulatory Visit
Admission: RE | Admit: 2010-04-30 | Discharge: 2010-04-30 | Payer: Self-pay | Source: Home / Self Care | Attending: Internal Medicine | Admitting: Internal Medicine

## 2010-05-04 ENCOUNTER — Telehealth: Payer: Self-pay | Admitting: Internal Medicine

## 2010-05-08 NOTE — Letter (Signed)
Summary: [facility]   Imported By: Bubba Hales 07/07/2009 09:29:44  _____________________________________________________________________  External Attachment:    Type:   Image     Comment:   External Document

## 2010-05-08 NOTE — Progress Notes (Signed)
Summary: Alprazolam  Phone Note Refill Request  on October 03, 2008 11:23 AM  Refills Requested: Medication #1:  ALPRAZOLAM XR 1 MG  XR24H-TAB 1 by mouth once daily   Last Refilled: 09/03/2008 Refill Request from pharmacy. last office visit 01-08-2008  [phone]  Initial call taken by: Dwana Curd, [place],  October 03, 2008 11:23 AM  Follow-up for Phone Call        OK for refill x 5 Follow-up by: Neena Rhymes MD,  October 04, 2008 9:17 AM      Prescriptions: ALPRAZOLAM XR 1 MG  XR24H-TAB (ALPRAZOLAM) 1 by mouth once daily  #30 x 5   Entered by:   Dwana Curd, CMA   Authorized by:   Neena Rhymes MD   Signed by:   Dwana Curd, CMA on 10/04/2008   Method used:   Telephoned to ...       [facility] [phone]* (retail)       [address]       [place], [place]  [address]       Ph: [phone]       Fax: [phone]   RxID:   [id number]

## 2010-05-08 NOTE — Letter (Signed)
Summary: [facility]   Imported By: Jerrye Noble D'jimraou 04/17/2007 14:37:48  _____________________________________________________________________  External Attachment:    Type:   Image     Comment:   External Document

## 2010-05-08 NOTE — Progress Notes (Signed)
Summary: REFILLS   Phone Note Refill Request Call back at [phone]   Refills Requested: Medication #1:  ALPRAZOLAM XR 1 MG  XR24H-TAB 1 by mouth once daily  Medication #2:  ALPRAZOLAM 0.5 MG TABS 1 by mouth once daily as needed.  Medication #3:  OXYCONTIN 10 MG XR12H-TAB 1 by mouth three times a day  Medication #4:  OXYCODONE-ACETAMINOPHEN 5-325 MG TABS 1 q 6 hours as needed breakthru pain - to fill mar 4 Initial call taken by: Charlsie Quest, [place],  July 17, 2009 9:37 AM  Follow-up for Phone Call        ok for Rx x 3 months on all 4 Rxs Follow-up by: Neena Rhymes MD,  July 18, 2009 5:27 AM  Additional Follow-up for Phone Call Additional follow up Details #1::        Hospital discharge summary printed out and prescriptions printed out. MD signed and both were left up front in cabinet. Pt informed and will pick up today. Additional Follow-up by: Ami Bullins CMA,  July 18, 2009 9:33 AM    New/Updated Medications: OXYCONTIN 10 MG XR12H-TAB (OXYCODONE HCL) 1 by mouth three times a day fill on or after 09/17/2009 OXYCONTIN 10 MG XR12H-TAB (OXYCODONE HCL) 1 by mouth three times a day fill on or after 07/18/2009 OXYCONTIN 10 MG XR12H-TAB (OXYCODONE HCL) 1 by mouth three times a day fill on or after 08/17/2009 OXYCODONE-ACETAMINOPHEN 5-325 MG TABS (OXYCODONE-ACETAMINOPHEN) 1 q 6 hours as needed breakthru pain  fill on or after 08/17/2009 OXYCODONE-ACETAMINOPHEN 5-325 MG TABS (OXYCODONE-ACETAMINOPHEN) 1 q 6 hours as needed breakthru pain  fill on or after 07/18/2009 OXYCODONE-ACETAMINOPHEN 5-325 MG TABS (OXYCODONE-ACETAMINOPHEN) 1 q 6 hours as needed breakthru pain  fill on or after 09/17/2009 ALPRAZOLAM 0.5 MG TABS (ALPRAZOLAM) 1 by mouth once daily as needed. Prescriptions: OXYCODONE-ACETAMINOPHEN 5-325 MG TABS (OXYCODONE-ACETAMINOPHEN) 1 q 6 hours as needed breakthru pain  fill on or after 09/17/2009  #90 x 0   Entered by:   Ami Bullins CMA   Authorized by:   Neena Rhymes MD   Signed by:   Charlynne Cousins CMA on 07/18/2009   Method used:   Print then Give to Patient   RxID:   [id number] OXYCODONE-ACETAMINOPHEN 5-325 MG TABS (OXYCODONE-ACETAMINOPHEN) 1 q 6 hours as needed breakthru pain  fill on or after 08/17/2009  #90 x 0   Entered by:   Ami Bullins CMA   Authorized by:   Neena Rhymes MD   Signed by:   Charlynne Cousins CMA on 07/18/2009   Method used:   Print then Give to Patient   RxID:   [id number] OXYCODONE-ACETAMINOPHEN 5-325 MG TABS (OXYCODONE-ACETAMINOPHEN) 1 q 6 hours as needed breakthru pain  fill on or after 07/18/2009  #90 x 0   Entered by:   Ami Bullins CMA   Authorized by:   Neena Rhymes MD   Signed by:   Charlynne Cousins CMA on 07/18/2009   Method used:   Print then Give to Patient   RxID:   [id number] OXYCONTIN 10 MG XR12H-TAB (OXYCODONE HCL) 1 by mouth three times a day fill on or after 09/17/2009  #90 x 0   Entered by:   Ami Bullins CMA   Authorized by:   Neena Rhymes MD   Signed by:   Charlynne Cousins CMA on 07/18/2009   Method used:   Print then Give to Patient   RxID:   [id number] OXYCONTIN 10 MG XR12H-TAB (OXYCODONE HCL) 1  by mouth three times a day fill on or after 08/17/2009  #90 x 0   Entered by:   Ami Bullins CMA   Authorized by:   Neena Rhymes MD   Signed by:   Charlynne Cousins CMA on 07/18/2009   Method used:   Print then Give to Patient   RxID:   [id number] OXYCONTIN 10 MG XR12H-TAB (OXYCODONE HCL) 1 by mouth three times a day fill on or after 07/18/2009  #90 x 0   Entered by:   Ami Bullins CMA   Authorized by:   Neena Rhymes MD   Signed by:   Charlynne Cousins CMA on 07/18/2009   Method used:   Print then Give to Patient   RxID:   [id number] ALPRAZOLAM 0.5 MG TABS (ALPRAZOLAM) 1 by mouth once daily as needed.  #90 x 3   Entered by:   Charlynne Cousins CMA   Authorized by:   Neena Rhymes MD   Signed by:   Charlynne Cousins CMA on 07/18/2009   Method used:   Print then Give to Patient   RxID:    [id number] ALPRAZOLAM XR 1 MG  XR24H-TAB (ALPRAZOLAM) 1 by mouth once daily  #30 x 3   Entered by:   Ami Bullins CMA   Authorized by:   Neena Rhymes MD   Signed by:   Charlynne Cousins CMA on 07/18/2009   Method used:   Print then Give to Patient   RxID:   [id number]

## 2010-05-08 NOTE — Medication Information (Signed)
Summary: Approved/[facility]  Approved/[facility]   Imported By: Bubba Hales 02/21/2010 08:18:45  _____________________________________________________________________  External Attachment:    Type:   Image     Comment:   External Document

## 2010-05-08 NOTE — Progress Notes (Signed)
  Phone Note Other Incoming   Caller: pt Summary of Call: Pt calling and states that he is having extreme pain in his left calf, Pt had an ultra sound 6 weeks ago to check for this problem and no finding of a blood clot were found. Pt had back surg on Mar 22, 2009. He is very concerned and states that his left calf is swelling with pain. What do you suggest?[phone] is his contact number Initial call taken by: Ami Bullins CMA,  June 09, 2009 11:06 AM  Follow-up for Phone Call        needs to be seen Follow-up by: Janith Lima MD,  June 09, 2009 11:21 AM     Appended Document:  Scheduled for apt with Dr Jenny Reichmann at 4:30

## 2010-05-08 NOTE — Assessment & Plan Note (Signed)
Summary: NP6/CAD/JML   Current Medications (verified): 1)  Plavix 75 Mg  Tabs (Clopidogrel Bisulfate) .... Every 2-3 Days 2)  Simvastatin 40 Mg  Tabs (Simvastatin) .... Every 2-3 Days 3)  Colchicine 0.6 Mg  Tabs (Colchicine) .... Two Times A Day 4)  Cozaar 50 Mg  Tabs (Losartan Potassium) .... Take 1 Tablet By Mouth Once A Day 5)  Hydrochlorothiazide 25 Mg  Tabs (Hydrochlorothiazide) .... 1/2 By Mouth Once Daily 6)  Celebrex 200 Mg  Caps (Celecoxib) .... Take 1 Tablet By Mouth Two Times A Day 7)  Protonix 40 Mg  Tbec (Pantoprazole Sodium) .... Take 1 Tablet By Mouth Once A Day 8)  Ambien Cr 12.5 Mg  Tbcr (Zolpidem Tartrate) .... Take 1 Tab By Mouth At Bedtime 9)  Vitamin E 400 Unit  Caps (Vitamin E) .... Take 1 Tablet By Mouth Once A Day 10)  Omega 3 Fish Oil 1000 .... Take 1 Tablet By Mouth Two Times A Day 11)  Aspirin Ec Low Dose 81 Mg  Tbec (Aspirin) .[name].. 1 Once Daily 12)  Gas-X Extra Strength 125 Mg  Caps (Simethicone) .... As Directed 13)  Oxycontin 10 Mg  Xr12h-Tab (Oxycodone Hcl) .... 2 Tabs in Am and 1 Tab in Pm For Chronic Pain Management. To Be Filled On or After 03/15/2009 14)  Alprazolam Xr 1 Mg  Xr24h-Tab (Alprazolam) .[name].. 1 By Mouth Once Daily 15)  Oxycodone Hcl 5 Mg  Tabs (Oxycodone Hcl) .[name].. 1 By Mouth Q 4 As Needed Breakthrough Pain. Fill On or After 03/28/2009 16)  Alprazolam 0.5 Mg Tabs (Alprazolam) .[name].. 1 By Mouth Once Daily As Needed. 17)  Leflunomide 10 Mg Tabs (Leflunomide) .[name].. 1 By Mouth Daily 18)  Prednisone 5 Mg Tabs (Prednisone) .[name].. 1 By Mouth Daily 19)  Multivitamins   Tabs (Multiple Vitamin) .[name].. 1 By Mouth Daily  Allergies (verified): 1)  ! Pcn  Visit Type:  Initial Consult Primary Provider:  Neena Rhymes MD  CC:  CAD.  History of Present Illness: The patient's were [place] we should read he has a history of coronary disease as described below. He status post stenting in 2006. At that time he had exertional angina. Since then he has had no further  chest discomfort. He is quite active exercising and active on the job walking quite a bit. With this he gets no chest discomfort, neck or arm discomfort. He denies any shortness of breath. He has no palpitations, presyncope or syncope. He has no PND or orthopnea. He does report having a stress test about a year ago which was normal. I reviewed lipids from earlier this year and he had an excellent ratio.   Past History:  Past Medical History: UCD Allergic rhinitis Colonic polyps, hx of Diverticulitis, hx of Hypertension Osteoarthritis Hemochromatosis: dx'd 14 years ago last ferritin Aug 11,'08 52 (22-322), Fe 136 CAD-(Minimal coronary plaque in the LAD and right coronary system.  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 July 2006.  Last nuclear stress Aug 11.'08 fixed anterior/inferior defect, no inducible ischemia, EF 81%)      . Physician Roster:         [place] surgeon -S[place]her         [place]         ortho- Bednarz/ankle, Dean-shoulder         Card - [place] ([place] [facility]-Dr. Sherryl Barters group)  GI- [name]         GUTerance Hart  Family History: Father - died 42; complications of CAD, prostate cancer, HTN Mother - 69; cancer survivor uterine, ankle edema, macular degeneration 1 brother - high Lipids, schizophrenic, dependent 1 sister - thyroid diseae Neg - colon cancer, DM, no MI.  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 Very busy life- some stress. New relationship - looks serious (Sept '09)  Review of Systems       As stated in the HPI and negative for all other systems.   Vital Signs:  Patient profile:   65year old male Height:      69 inches Weight:      167 pounds BMI:     24.75 Pulse  rate:   86 / minute Resp:     16 per minute BP sitting:   145 / 88  (right arm)  Vitals Entered By: CLevora Angel CNA (February 14, 2009 12:22 PM)  Physical Exam  General:  Well developed, well nourished, in no acute distress. Head:  normocephalic and atraumatic Eyes:  PERRLA/EOM intact; conjunctiva and lids normal. Mouth:  Teeth, gums and palate normal. Oral mucosa normal. Neck:  Neck supple, no JVD. No masses, thyromegaly or abnormal cervical nodes. Chest Wall:  no deformities or breast masses noted Lungs:  Clear bilaterally to auscultation and percussion. Abdomen:  Bowel sounds positive; abdomen soft and non-tender without masses, organomegaly, or hernias noted. No hepatosplenomegaly. Msk:  Back normal, normal gait. Muscle strength and tone normal. Extremities:  No clubbing or cyanosis. Neurologic:  Alert and oriented x 3. Skin:  Intact without lesions or rashes. Cervical Nodes:  no significant adenopathy Axillary Nodes:  no significant adenopathy Inguinal Nodes:  no significant adenopathy Psych:  Normal affect.   Detailed Cardiovascular Exam  Neck    Carotids: Carotids full and equal bilaterally without bruits.      Neck Veins: Normal, no JVD.    Heart    Inspection: no deformities or lifts noted.      Palpation: normal PMI with no thrills palpable.      Auscultation: regular rate and rhythm, S1, S2 without murmurs, rubs, gallops, or clicks.    Vascular    Abdominal Aorta: no palpable masses, pulsations, or audible bruits.      Femoral Pulses: normal femoral pulses bilaterally.      Pedal Pulses: normal pedal pulses bilaterally.      Radial Pulses: normal radial pulses bilaterally.      Peripheral Circulation: no clubbing, cyanosis, or edema noted with normal capillary refill.     EKG  Procedure date:  02/14/2009  Findings:      RAD, NSR, Poor anterior R wave progression.  No acute ST T wave changes  Impression & Recommendations:  Problem # 1:  CAD  (ICD-414.00) The patient has had no new symptoms since his catheterization. He is but dissipating and secondary risk reduction. At this time no further cardiovascular testing is suggested. Of note he may be requiring and orthopedic surgery upcoming. If so he would be at acceptable risk for the planned procedure.  Orders: EKG w/ Interpretation (93000)  Problem # 2:  DYSLIPIDEMIA (ICD-272.4) I reviewed his labs from July. He had an LDL of 89 and an HDL of 49. He will continue on the meds as listed.  Problem # 3:  HEMOCHROMATOSIS, HX OF (ICD-V12.3) He has had this followed very closely and I would suspect no cardiac involvement with his excellent treatment but will followup with echocardiograms over the years. BNP was also be helpful.  Problem # 4:  HYPERTENSION (ICD-401.9) His blood pressure is well controlled and he will continue the meds as listed.  Patient Instructions: 1)  Your physician recommends that you schedule a follow-up appointment in: 12 months with Dr Percival Spanish 2)  Your physician recommends that you continue on your current medications as directed. Please refer to the Current Medication list given to you today.

## 2010-05-08 NOTE — Letter (Signed)
Summary: [facility] By: Jerrye Noble D'jimraou 04/14/2007 15:18:21  _____________________________________________________________________  External Attachment:    Type:   Image     Comment:   External Document

## 2010-05-08 NOTE — Progress Notes (Signed)
Summary: Early fill  Phone Note From Pharmacy   [facility] [phone]* Summary of Call: Received fax from pharmacy stating "pt wants to pick up 3/4 going to [place] for a week-pls. call Dr. for early fill-". This note was on Oxycontin 19m script 06/13/2008, pt requesting approval for early fill. Please advise.  [phone] [phone] Initial call taken by: JIran PlanasCMA,  June 06, 2008 6:34 PM  Follow-up for Phone Call        OK for early refill. Follow-up by: MNeena RhymesMD,  June 06, 2008 10:18 PM  Additional Follow-up for Phone Call Additional follow up Details #1::        Pharmacy notified Additional Follow-up by: LEstell HarpinCMA,  June 07, 2008 9:03 AM

## 2010-05-08 NOTE — Progress Notes (Signed)
Summary: Alprazolam RF  Phone Note From Pharmacy   [facility] [phone]* Summary of Call: rec fax from pharm requesting Alprazolam 0.26m 2 by mouth once daily as needed #180.  This dose is not on our active ist. Initial call taken by: SJonathon Resides [facility],  January 19, 2010 4:57 PM  Follow-up for Phone Call        current dosing is 0.5100m1 once daily as needed. Did check the XR incorrectly but actually called in the 0.76m276mab - 1 once daily #90. To change dosing will require patient call or ov.  Follow-up by: MicNeena Rhymes,  January 19, 2010 6:21 PM    Prescriptions: ALPRAZOLAM 0.5 MG TABS (ALPRAZOLAM) 1 by mouth once daily as needed.  #90 x 3   Entered and Authorized by:   MicNeena Rhymes   Signed by:   MicNeena Rhymes on 01/19/2010   Method used:   [facility]phoned [facility] ...       [phone]       [address]  [address]    Ph: [phone]    Fax: [phone]:   [id number] XR 1 MG  XR24H-TAB (ALPRAZOLAM) 1 by mouth once daily  #90 x 3   Entered and Authorized by:   MicNeena Rhymes   Signed by:   MicNeena Rhymes on 01/19/2010   Method used:   [facility]phoned to ...       [phone]       [address]    [place]  [id number]    Ph: [phone]    Fax: [phone]:   [id number]

## 2010-05-08 NOTE — Letter (Signed)
Summary: persistent swelling hands/[facility]  persistent swelling hands/[facility]   Imported By: Bubba Hales 02/11/2008 13:18:43  _____________________________________________________________________  External Attachment:    Type:   Image     Comment:   External Document

## 2010-05-08 NOTE — Medication Information (Signed)
Summary: Prior Authorization Request for Ambien CR/[facility]  Prior Authorization Request for Ambien CR/[facility]   Imported By: Laural Benes 12/08/2007 14:50:54  _____________________________________________________________________  External Attachment:    Type:   Image     Comment:   External Document

## 2010-05-08 NOTE — Letter (Signed)
Summary: [facility]   Imported By: Phillis Knack 10/31/2008 10:56:02  _____________________________________________________________________  External Attachment:    Type:   Image     Comment:   External Document

## 2010-05-08 NOTE — Progress Notes (Signed)
Summary: REFILLs  Phone Note Refill Request   Refills Requested: Medication #1:  OXYCONTIN 10 MG XR12H-TAB 1 by mouth three times a day fill on or after 12/18/2009   Notes: 3 mths of rfs  Medication #2:  OXYCODONE-ACETAMINOPHEN 5-325 MG TABS 1 q 6 hours as needed breakthru pain  fill on or after 12/18/2009   Notes: 3 mths of rfs Initial call taken by: Charlsie Quest, [place],  January 08, 2010 2:17 PM  Follow-up for Phone Call        ok for months Rx x 3 scripts Follow-up by: Neena Rhymes MD,  January 08, 2010 5:46 PM  Additional Follow-up for Phone Call Additional follow up Details #1::        pt informed rxs are ready. left up front for pick up Additional Follow-up by: Ami Bullins CMA,  January 09, 2010 4:30 PM    New/Updated Medications: OXYCONTIN 10 MG XR12H-TAB (OXYCODONE HCL) 1 by mouth three times a day fill on or after 01/17/2010 OXYCONTIN 10 MG XR12H-TAB (OXYCODONE HCL) 1 by mouth three times a day fill on or after 02/17/2010 OXYCONTIN 10 MG XR12H-TAB (OXYCODONE HCL) 1 by mouth three times a day fill on or after 03/19/2010 OXYCODONE-ACETAMINOPHEN 5-325 MG TABS (OXYCODONE-ACETAMINOPHEN) 1 q 6 hours as needed breakthru pain  fill on or after 01/17/2010 OXYCODONE-ACETAMINOPHEN 5-325 MG TABS (OXYCODONE-ACETAMINOPHEN) 1 q 6 hours as needed breakthru pain  fill on or after 02/17/2010 OXYCODONE-ACETAMINOPHEN 5-325 MG TABS (OXYCODONE-ACETAMINOPHEN) 1 q 6 hours as needed breakthru pain  fill on or after 03/19/2010 Prescriptions: OXYCODONE-ACETAMINOPHEN 5-325 MG TABS (OXYCODONE-ACETAMINOPHEN) 1 q 6 hours as needed breakthru pain  fill on or after 03/19/2010  #90 x 0   Entered by:   Ami Bullins CMA   Authorized by:   Neena Rhymes MD   Signed by:   Charlynne Cousins CMA on 01/09/2010   Method used:   Print then Give to Patient   RxID:   [id number] OXYCODONE-ACETAMINOPHEN 5-325 MG TABS (OXYCODONE-ACETAMINOPHEN) 1 q 6 hours as needed breakthru pain  fill on or after 02/17/2010  #90  x 0   Entered by:   Ami Bullins CMA   Authorized by:   Neena Rhymes MD   Signed by:   Charlynne Cousins CMA on 01/09/2010   Method used:   Print then Give to Patient   RxID:   [id number] OXYCODONE-ACETAMINOPHEN 5-325 MG TABS (OXYCODONE-ACETAMINOPHEN) 1 q 6 hours as needed breakthru pain  fill on or after 01/17/2010  #90 x 0   Entered by:   Ami Bullins CMA   Authorized by:   Neena Rhymes MD   Signed by:   Charlynne Cousins CMA on 01/09/2010   Method used:   Print then Give to Patient   RxID:   [id number] OXYCONTIN 10 MG XR12H-TAB (OXYCODONE HCL) 1 by mouth three times a day fill on or after 03/19/2010  #90 x 0   Entered by:   Ami Bullins CMA   Authorized by:   Neena Rhymes MD   Signed by:   Charlynne Cousins CMA on 01/09/2010   Method used:   Print then Give to Patient   RxID:   [id number] OXYCONTIN 10 MG XR12H-TAB (OXYCODONE HCL) 1 by mouth three times a day fill on or after 02/17/2010  #90 x 0   Entered by:   Ami Bullins CMA   Authorized by:   Neena Rhymes MD   Signed by:   Charlynne Cousins  CMA on 01/09/2010   Method used:   Print then Give to Patient   RxID:   [id number] OXYCONTIN 10 MG XR12H-TAB (OXYCODONE HCL) 1 by mouth three times a day fill on or after 01/17/2010  #90 x 0   Entered by:   Ami Bullins CMA   Authorized by:   Neena Rhymes MD   Signed by:   Charlynne Cousins CMA on 01/09/2010   Method used:   Print then Give to Patient   RxID:   [id number]

## 2010-05-08 NOTE — Progress Notes (Signed)
Summary: Referral-MRI insurance review  Phone Note Outgoing Call   Call placed by: Ophelia Charter,  November 03, 2008 9:33 AM Call placed to: Insurer Summary of Call: Dr Linda Hedges, called [employer] they won't approve MRI L-Spine with out a doctor to doctor review. There phone # is [phone] option 4, case # is [id number].  Thanks Initial call taken by: Ophelia Charter,  November 03, 2008 9:38 AM  Follow-up for Phone Call        insurance did approve for without contrast not with is this ok? Follow-up by: Darnell Level,  November 03, 2008 4:43 PM  Additional Follow-up for Phone Call Additional follow up Details #1::        authorization # [id number] Additional Follow-up by: Neena Rhymes MD,  November 04, 2008 2:14 PM

## 2010-05-08 NOTE — Letter (Signed)
Summary: Sherlene Shams I Groh,MD   Imported By: Bubba Hales 07/28/2008 11:01:30  _____________________________________________________________________  External Attachment:    Type:   Image     Comment:   External Document

## 2010-05-08 NOTE — Progress Notes (Signed)
Summary: Referral  Phone Note Call from Patient   Caller: Patient [phone] Summary of Call: pt called stating that his current cardiologist Dr. Cyndra Numbers is leaving practice. pt would like referral to a new cardio MD Initial call taken by: Crissie Sickles, [place],  January 16, 2009 2:54 PM  Follow-up for Phone Call        done Follow-up by: Janith Lima MD,  January 16, 2009 5:28 PM  Additional Follow-up for Phone Call Additional follow up Details #1::        pt aware Additional Follow-up by: Crissie Sickles, CMA,  January 17, 2009 8:40 AM

## 2010-05-08 NOTE — Progress Notes (Signed)
Summary: RFs  Phone Note Refill Request   Refills Requested: Medication #1:  OXYCONTIN 10 MG  XR12H-TAB 2 tabs in AM and 1 tab in PM for chronic pain management. To be filled on or after 12/14/2008  Medication #2:  OXYCODONE HCL 5 MG  TABS 1 by mouth q 4 as needed breakthrough pain. Fill on or after 12/27/2008  Medication #3:  ALPRAZOLAM XR 1 MG  XR24H-TAB 1 by mouth once daily  Medication #4:  ALPRAZOLAM 0.5 MG TABS 1 by mouth once daily as needed. Initial call taken by: Charlsie Quest, [place],  January 09, 2009 9:59 AM  Follow-up for Phone Call        OK for montly refills x 3 on oxycontin and oxycodone.  OK for alprazolam x 5 Follow-up by: Neena Rhymes MD,  January 09, 2009 11:25 AM  Additional Follow-up for Phone Call Additional follow up Details #1::        pt informed and will come pick up perscriptions Additional Follow-up by: Ami Bullins CMA,  January 09, 2009 4:56 PM    New/Updated Medications: OXYCONTIN 10 MG  XR12H-TAB (OXYCODONE HCL) 2 tabs in AM and 1 tab in PM for chronic pain management. To be filled on or after 01/13/2009 OXYCONTIN 10 MG  XR12H-TAB (OXYCODONE HCL) 2 tabs in AM and 1 tab in PM for chronic pain management. To be filled on or after 03/15/2009 OXYCONTIN 10 MG  XR12H-TAB (OXYCODONE HCL) 2 tabs in AM and 1 tab in PM for chronic pain management. To be filled on or after 02/13/2009 OXYCODONE HCL 5 MG  TABS (OXYCODONE HCL) 1 by mouth q 4 as needed breakthrough pain. Fill on or after 02/26/2009 OXYCODONE HCL 5 MG  TABS (OXYCODONE HCL) 1 by mouth q 4 as needed breakthrough pain. Fill on or after 01/26/2009 OXYCODONE HCL 5 MG  TABS (OXYCODONE HCL) 1 by mouth q 4 as needed breakthrough pain. Fill on or after 03/28/2009 ALPRAZOLAM 0.5 MG TABS (ALPRAZOLAM) 1 by mouth once daily as needed. Prescriptions: ALPRAZOLAM 0.5 MG TABS (ALPRAZOLAM) 1 by mouth once daily as needed.  #90 x 1   Entered by:   Ami Bullins CMA   Authorized by:   Neena Rhymes MD   Signed  by:   Charlynne Cousins CMA on 01/09/2009   Method used:   Print then Give to Patient   RxID:   [id number] ALPRAZOLAM XR 1 MG  XR24H-TAB (ALPRAZOLAM) 1 by mouth once daily  #30 x 5   Entered by:   Ami Bullins CMA   Authorized by:   Neena Rhymes MD   Signed by:   Charlynne Cousins CMA on 01/09/2009   Method used:   Print then Give to Patient   RxID:   [id number] OXYCODONE HCL 5 MG  TABS (OXYCODONE HCL) 1 by mouth q 4 as needed breakthrough pain. Fill on or after 03/28/2009  #60 x 0   Entered by:   Ami Bullins CMA   Authorized by:   Neena Rhymes MD   Signed by:   Charlynne Cousins CMA on 01/09/2009   Method used:   Reprint   RxID:   [id number] OXYCONTIN 10 MG  XR12H-TAB (OXYCODONE HCL) 2 tabs in AM and 1 tab in PM for chronic pain management. To be filled on or after 03/15/2009  #90 x 0   Entered by:   Ami Bullins CMA   Authorized by:   Neena Rhymes MD  Signed by:   Charlynne Cousins CMA on 01/09/2009   Method used:   Reprint   RxID:   [id number] OXYCODONE HCL 5 MG  TABS (OXYCODONE HCL) 1 by mouth q 4 as needed breakthrough pain. Fill on or after 02/26/2009  #60 x 0   Entered by:   Ami Bullins CMA   Authorized by:   Neena Rhymes MD   Signed by:   Charlynne Cousins CMA on 01/09/2009   Method used:   Print then Give to Patient   RxID:   [id number] OXYCONTIN 10 MG  XR12H-TAB (OXYCODONE HCL) 2 tabs in AM and 1 tab in PM for chronic pain management. To be filled on or after 03/15/2009  #90 x 0   Entered by:   Ami Bullins CMA   Authorized by:   Neena Rhymes MD   Signed by:   Charlynne Cousins CMA on 01/09/2009   Method used:   Print then Give to Patient   RxID:   [id number] OXYCODONE HCL 5 MG  TABS (OXYCODONE HCL) 1 by mouth q 4 as needed breakthrough pain. Fill on or after 02/26/2009  #60 x 0   Entered by:   Ami Bullins CMA   Authorized by:   Neena Rhymes MD   Signed by:   Charlynne Cousins CMA on 01/09/2009   Method used:   Print then Give to Patient   RxID:    [id number] OXYCONTIN 10 MG  XR12H-TAB (OXYCODONE HCL) 2 tabs in AM and 1 tab in PM for chronic pain management. To be filled on or after 02/13/2009  #90 x 0   Entered by:   Ami Bullins CMA   Authorized by:   Neena Rhymes MD   Signed by:   Charlynne Cousins CMA on 01/09/2009   Method used:   Print then Give to Patient   RxID:   [id number] OXYCODONE HCL 5 MG  TABS (OXYCODONE HCL) 1 by mouth q 4 as needed breakthrough pain. Fill on or after 01/26/2009  #60 x 0   Entered by:   Ami Bullins CMA   Authorized by:   Neena Rhymes MD   Signed by:   Charlynne Cousins CMA on 01/09/2009   Method used:   Print then Give to Patient   RxID:   [id number] OXYCONTIN 10 MG  XR12H-TAB (OXYCODONE HCL) 2 tabs in AM and 1 tab in PM for chronic pain management. To be filled on or after 01/13/2009  #90 x 0   Entered by:   Ami Bullins CMA   Authorized by:   Neena Rhymes MD   Signed by:   Charlynne Cousins CMA on 01/09/2009   Method used:   Print then Give to Patient   RxID:   [id number]

## 2010-05-08 NOTE — Assessment & Plan Note (Signed)
Summary: med f/u SD   Vital Signs:  Patient Profile:   65 Years Old Male Height:     69 inches Weight:      158 pounds Temp:     97.6 degrees F oral Pulse rate:   100 / minute BP sitting:   150 / 84  (left arm) Cuff size:   regular  Vitals Entered By: Iran Planas CMA (December 15, 2007 1:19 PM)                 PCP:  Mckenzye Cutright  Chief Complaint:  Medication check/STD bloodwork.  History of Present Illness: Has had a good report from [facility] re: ankle reconstruction.  1. On oxycontin 60m three times a day. Seems to be working well except for some breakthrough pain, for which he has use oxycodone 591m  2. Getting good results from Alprazolam 0.5 1/2 to 1 once daily. Would like to change the legend to 1 by mouth once daily.  3. Would like an STD testing: HSV, HIV, Syphylis, GC, Chlamydia.    Current Allergies: ! PCN  Past Medical History:    Reviewed history from 03/30/2007 and no changes required:       UCD       Allergic rhinitis       Colonic polyps, hx of       Diverticulitis, hx of       Hypertension       Osteoarthritis       hemochromatosis: dx'd 14 years ago last ferritin Aug 11,'08 52 (22-322), Fe 136       CAD-last nuclear stress Aug 11.'08 fixed anterior/inferior defect, no inducible ischemia, EF 81%                                          .       Physician Roster:               RhNikiskiurgeon -Sypher               GS- Mat MaHassell Done             ortho- Bednarz/ankle, Dean-shoulder               Card - [facility]-Dr. 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    very busy life- some stress.    New relationship - looks serious (Sept '09)     Physical Exam  General:     WNWD white male NAD Eyes:     pupils round, corneas and lenses clear, and no injection.   Lungs:     normal respiratory effort.   Heart:     normal rate.   Msk:     still with slight gait abnormality. Neurologic:     alert & oriented X3 and cranial nerves II-XII intact.   Psych:     Oriented X3, normally interactive, good eye contact, and not anxious appearing.      Impression & Recommendations:  Problem # 1:  CONTACT WITH OR EXPOSURE TO VENEREAL DISEASES (ICD-V01.6) Patient wishes STD screening as he moves into a serious relationship.  Plan: STD labs.  Orders: T-RPR (Syphilis) [phone]) T- * Misc. 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 He reports that the current regimen of oxycontin 20 mg AM, 28m PM generally works. He does have occasional breakthrough pain due to over-doing physical exertion and requests refill of oxydone 572mto use as needed.  Plan: continue pain regimen including oxycodone 36m67mor breakthru pain.  Note: he does report that he will be having shoulder reconstructive surgery in the future.  His updated medication list for this problem includes:    Celebrex 200 Mg Caps (Celecoxib) ....[name] Take 1 tablet by mouth two times a day    Aspirin Ec Low Dose 81 Mg Tbec  (Aspirin) ....[name] 1 once daily    Oxycodone Hcl 5 Mg Tabs (Oxycodone hcl) ....Marland KitchenMarland KitchenMarland KitchenMarland Kitchenm19mmg daily    Oxycontin 10 Mg Xr12h-tab (Oxycodone hcl) .....[name]2 tabs in am and 1 tab in pm for chronic pain management. to be filled on or after 02/14/2008    Oxycodone Hcl 5 Mg Tabs (Oxycodone hcl) .....[name]1 by mouth q 4 as needed breakthrough pain.   Complete Medication List: 1)  Plavix 75 Mg Tabs (Clopidogrel bisulfate) .... Every 2-3 days 2)  Simvastatin 40 Mg Tabs (Simvastatin) .... Every 2-3 days 3)  Colchicine 0.6 Mg Tabs (Colchicine) .... Two times a day 4)  Cozaar 50 Mg Tabs (Losartan potassium) .... Take 1 tablet by mouth once a day 5)  Hydrochlorothiazide 25 Mg Tabs (Hydrochlorothiazide) .... 1/2 by mouth once daily 6)  Celebrex 200 Mg Caps (Celecoxib) .... Take 1 tablet by mouth two times a day 7)  Protonix 40 Mg Tbec (Pantoprazole sodium) .... Take 1 tablet by mouth once a day 8)  Ambien Cr 12.5 Mg Tbcr (Zolpidem tartrate) .... Take 1 tab by mouth at bedtime 9)  Vitamin E 400 Unit Caps (Vitamin e) .... Take 1 tablet by mouth once a day 10)  Omega 3 Fish Oil 1000  .... Take 1 tablet by mouth two times a day 11)  Aspirin Ec Low Dose 81 Mg Tbec (Aspirin) .... [name] once daily 12)  Gas-x Extra Strength 125 Mg Caps (Simethicone) .... As directed 13)  Oxycodone Hcl 5 Mg Tabs (Oxycodone hcl) .... [name]0mg17mg daily 14)  Oxycontin 10 Mg Xr12h-tab (Oxycodone hcl) .... 2 tabs in am and 1 tab in pm for chronic pain management. to be filled on or after 02/14/2008 15)  Alprazolam Xr 1 Mg Xr24h-tab (Alprazolam) .[name].. 1 by mouth once daily 16)  Oxycodone Hcl 5 Mg Tabs (Oxycodone hcl) .[name].. 1 by mouth q 4 as needed breakthrough pain. 17)  Alprazolam 0.5 Mg Tabs (Alprazolam) .[name].. 1 by mouth once daily as needed.    Prescriptions: OXYCONTIN 10 MG  XR12H-TAB (OXYCODONE HCL) 2 tabs in AM and 1 tab in PM for chronic pain management. To be filled on or after 02/14/2008  #90 x 0   Entered by:   Iran Planas CMA    Authorized by:   Neena Rhymes MD   Signed by:   Iran Planas CMA on 12/15/2007   Method used:   Print then Give to Patient   RxID:   [id number] OXYCONTIN 10 MG  XR12H-TAB (OXYCODONE HCL) 2 tabs in AM and 1 tab in PM for chronic pain management. To be filled on or after 01/14/2008  #90 x 0   Entered by:   Iran Planas CMA   Authorized by:   Neena Rhymes MD   Signed by:   Iran Planas CMA on 12/15/2007   Method used:   Print then Give to Patient   RxID:   [id number] OXYCONTIN 10 MG  XR12H-TAB (OXYCODONE HCL) 2 tabs in AM and 1 tab in PM for chronic pain management. To be filled on or after 12/15/2007  #90 x 0   Entered by:   Iran Planas CMA   Authorized by:   Neena Rhymes MD   Signed by:   Iran Planas CMA on 12/15/2007   Method used:   Print then Give to Patient   RxID:   [id number] ALPRAZOLAM 0.5 MG TABS (ALPRAZOLAM) 1 by mouth once daily as needed.  #30 x 5   Entered and Authorized by:   Neena Rhymes MD   Signed by:   Neena Rhymes MD on 12/15/2007   Method used:   Print then Give to Patient   RxID:   [id number] ALPRAZOLAM XR 1 MG  XR24H-TAB (ALPRAZOLAM) 1 by mouth once daily  #30 x 3   Entered and Authorized by:   Neena Rhymes MD   Signed by:   Neena Rhymes MD on 12/15/2007   Method used:   Print then Give to Patient   RxID:   [id number] OXYCODONE HCL 5 MG  TABS (OXYCODONE HCL) 1m-15mg daily  #30 x 0   Entered and Authorized by:   MNeena RhymesMD   Signed by:   MNeena RhymesMD on 12/15/2007   Method used:   Print then Give to Patient   RxID::   [id number] ]

## 2010-05-08 NOTE — Assessment & Plan Note (Signed)
Summary: NEW PT/[facility]/FEE NOTICE MAILED/NWS   Vital Signs:  Patient Profile:   65 Years Old Male Height:     69 inches Weight:      164 pounds Temp:     98.5 degrees F oral Pulse rate:   89 / minute BP sitting:   157 / 89  (left arm) Cuff size:   large  Vitals Entered By: Iran Planas CMA (March 30, 2007 1:59 PM)                 PCP:  Alanni Vader  Chief Complaint:  New pt to become est.  History of Present Illness: Patient presents to establish for on-going continuity care.  CC: checks BP at home. Usually 120/80, but can have spikes of BP, which he notices. Has done well on cozaar 63m plus HCTZ 12.535m   Current Allergies (reviewed today): ! PCN  Past Medical History:    Reviewed history and no changes required:       UCD       Allergic rhinitis       Colonic polyps, hx of       Diverticulitis, hx of       Hypertension       Osteoarthritis       hemochromatosis: dx'd 14 years ago last ferritin Aug 11,'08 52 (22-322), Fe 136       CAD-last nuclear stress Aug 11.'08 fixed anterior/inferior defect, no inducible ischemia, EF 81%                                          .       Physician Roster:               RhHuntleyurgeon -Sypher               GS- Mat MaHassell Done             ortho- Bednarz/ankle, Dean-shoulder               Card - KeVerlon SettinggLady Garyardiology-Dr. 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       Foot surgery for removal of bone spurs right foot Nov '08   Family History:    father - died 7742complications of CAD, prostate cancer, HTN    mother - 1931 cancer survivor uterine, ankle edema, macular degeneration    1 brother - high Lipids, schizophrenic, dependent    1 sister - thyroid diseae    Neg - colon cancer, DM, no MI.  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    very busy life- some stress.   Risk Factors:  Tobacco use:  never Caffeine use:  1 drinks per day Alcohol use:  yes    Type:  wine or beer    Drinks per day:  2    Counseled to quit/cut down alcohol use:  no Exercise:  yes    Times per week:  7    Type:  walks several miles a day. Seatbelt use:  100 %  Colonoscopy History:     Date of Last Colonoscopy:  09/14/2003    Results:  Normal    Review of Systems  The patient denies anorexia, fever, weight loss, vision loss, decreased hearing, hoarseness, chest pain, syncope, dyspnea on exhertion, peripheral edema, prolonged cough, hemoptysis, abdominal pain, hematochezia, hematuria, muscle weakness, suspicious skin lesions, transient blindness, difficulty walking, depression, unusual weight change, testicular masses, melena, severe indigestion/heartburn, incontinence, genital sores, abnormal bleeding, enlarged lymph nodes, and angioedema.     Physical Exam  General:     Well-developed,well-nourished,in no acute distress; alert,appropriate and cooperative throughout examination Head:     Normocephalic and atraumatic without obvious abnormalities. No apparent alopecia or balding. Eyes:     No corneal or conjunctival inflammation noted. EOMI. Perrla. Funduscopic exam benign, without hemorrhages, exudates or papilledema. Vision grossly normal. Ears:     External ear exam shows no significant lesions or deformities.  Otoscopic examination reveals clear canals, tympanic membranes are intact bilaterally without bulging, retraction, inflammation or  discharge. Hearing is grossly normal bilaterally. Mouth:     Oral mucosa and oropharynx without lesions or exudates.  Teeth in good repair. Neck:     No deformities, masses, or tenderness noted. Lungs:     Normal respiratory effort, chest expands symmetrically. Lungs are clear to auscultation, no crackles or wheezes. Heart:     Normal rate and regular rhythm. S1 and S2 normal without gallop, murmur, click, rub or other extra sounds. Abdomen:     Bowel sounds positive,abdomen soft and non-tender without masses, organomegaly or hernias noted. Msk:     No deformity or scoliosis noted of thoracic or lumbar spine.  Enlargement of PIP and DIP joints both hands. Brace on right ankle. Knees look OK. Pulses:     R and L carotid,radial,femoral,dorsalis pedis and posterior tibial pulses are full and equal bilaterally Extremities:     No clubbing, cyanosis, edema, or deformity noted with normal full range of motion of all joints.   Neurologic:     No cranial nerve deficits noted. Station and gait are normal. Plantar reflexes are down-going bilaterally. DTRs are symmetrical throughout. Sensory, motor and coordinative functions appear intact. Skin:     Intact without suspicious lesions or rashes Cervical Nodes:     No lymphadenopathy noted Axillary Nodes:     No palpable lymphadenopathy Psych:     Cognition and judgment appear intact. Alert and cooperative with normal attention span and concentration. No apparent delusions, illusions, hallucinations    Impression & Recommendations:  Problem # 1:  HYPERTENSION (ICD-401.9)  His updated medication list for this problem includes:    Cozaar 50 Mg Tabs (Losartan potassium) .[name]... Take 1 tablet by mouth once a day    Hydrochlorothiazide 25 Mg Tabs (Hydrochlorothiazide) .[name]... 1/2 by mouth once daily  BP today: 157/89  BP is generally controlled with occasional excursions.  Plan: continue present medications.   Problem #  2:  ALLERGIC RHINITIS  (ICD-477.9) stable  Problem # 3:  CAD (ICD-414.00) Stable. Will follow-up with Dr. Verlon Setting as instructed.  His updated medication list for this problem includes:    Plavix 75 Mg Tabs (Clopidogrel bisulfate) ..... Every 2-3 days    Cozaar 50 Mg Tabs (Losartan potassium) .[name]... Take 1 tablet by mouth once a day    Hydrochlorothiazide 25 Mg Tabs (Hydrochlorothiazide) .[name]... 1/2 by mouth once daily    Aspirin Ec Low Dose 81 Mg Tbec (Aspirin) .[name]... 1 once daily   Problem # 4:  DYSLIPIDEMIA (ICD-272.4) On medication for control related to coronary artery disease.  His updated medication list for this problem includes:    Simvastatin 40 Mg Tabs (Simvastatin) ..... Every 2-3 days  Plan: lipid profile and will leave adjustment of medication to Dr. Verlon Setting. Goal should be an LDL 80 or less with a history of CAD.   Problem # 5:  HEMOCHROMATOSIS, HX OF (ICD-V12.3) Long standing problem. Will need to check lab: CBC and Iron level  Problem # 6:  GENERALIZED OSTEOARTHROSIS UNSPECIFIED SITE (ICD-715.00) Has had mutlple operations and does deal with routine pain. Presently stable. Question of gout. Does follow with Dr. Lolita Patella.  His updated medication list for this problem includes:    Celebrex 200 Mg Caps (Celecoxib) .[name]... Take 1 tablet by mouth two times a day    Aspirin Ec Low Dose 81 Mg Tbec (Aspirin) .[name]... 1 once daily  Plan: continue present medications.   Problem # 7:  Preventive Health Care (ICD-V70.0) seems to medically stable at this time, with primary active problem being osteoarthrosis.   Pt reports having had a recent, 3 yrs ago, colonoscopy that was clear.  See Hessie Diener for annual prostate evaluations. Last PSA was normal.  To return for routine new patient follow-up 3-4 months.  Complete Medication List: 1)  Plavix 75 Mg Tabs (Clopidogrel bisulfate) .... Every 2-3 days 2)  Simvastatin 40 Mg Tabs (Simvastatin) .... Every 2-3 days 3)  Colchicine 0.6 Mg Tabs (Colchicine)  .... Two times a day 4)  Cozaar 50 Mg Tabs (Losartan potassium) .... Take 1 tablet by mouth once a day 5)  Hydrochlorothiazide 25 Mg Tabs (Hydrochlorothiazide) .... 1/2 by mouth once daily 6)  Celebrex 200 Mg Caps (Celecoxib) .... Take 1 tablet by mouth two times a day 7)  Protonix 40 Mg Tbec (Pantoprazole sodium) .... Take 1 tablet by mouth once a day 8)  Ambien Cr 12.5 Mg Tbcr (Zolpidem tartrate) .... Take 1 tab by mouth at bedtime 9)  Vitamin E 400 Unit Caps (Vitamin e) .... Take 1 tablet by mouth once a day 10)  Omega 3 Fish Oil 1000  .... Take 1 tablet by mouth two times a day 11)  Aspirin Ec Low Dose 81 Mg Tbec (Aspirin) .[name].. 1 once daily   Patient Instructions: 1)  Please schedule a follow-up appointment in 3 months. 2)  All medical problems seem stable, see attached note. You will need to come back for lab at your convenience: Lipid panel 272.4; CBC and Iron panel for V12.3 hemachromatosis; metabolic panel 540.9; 3)  hepatic panel 995.2    Prescriptions: AMBIEN CR 12.5 MG  TBCR (ZOLPIDEM TARTRATE) Take 1 tab by mouth at bedtime  #30 x PRN   Entered and Authorized by:   Neena Rhymes MD   Signed by:   Neena Rhymes MD on 03/30/2007   Method used:   Electronically sent to .[facility]land Kitchen[facility] Kitchen  [facility]  [place] [place]  [address]       Ph: [phone]       Fax: [phone]   RxID:   [id number] PROTONIX 40 MG  TBEC (PANTOPRAZOLE SODIUM) Take 1 tablet by mouth once a day  #30 x PRN   Entered and Authorized by:   Neena Rhymes MD   Signed by:   Neena Rhymes MD on 03/30/2007   Method used:   [facility] sent to ...       [facility]  [place] [place]  [address]       Ph: [phone]       Fax: [phone]   RxID:   [id number] CELEBREX 200 MG  CAPS (CELECOXIB) Take 1 tablet by mouth two times a day  #60 x PRN    Entered and Authorized by:   Neena Rhymes MD   Signed by:   Neena Rhymes MD on 03/30/2007   Method used:   Electron[facility] sent to ...       [facility]  [place], [place]  [address]       Ph: [phone]       Fax: [phone]   RxID:   [id number] HYDROCHLOROTHIAZIDE 25 MG  TABS (HYDROCHLOROTHIAZIDE) 1/2 by mouth once daily  #100 x 3   Entered and Authorized by:   Neena Rhymes MD   Signed by:   Neena Rhymes MD on 03/30/2007   Method used:   Electronically sent to ...       [facility]  [place] [place]  [address]       Ph: [phone]       Fax: [phone]   RxID:   [id number] COZAAR 50 MG  TABS (LOSARTAN POTASSIUM) Take 1 tablet by mouth once a day  #30PRN x 0   Entered and Authorized by:   Neena Rhymes MD   Signed by:   Neena Rhymes MD on 03/30/2007   Method used:   Electronically sent to ...       [facility]  [place] [place]  [address]       Ph: [phone]       Fax: [phone]   RxID:   [id number] COLCHICINE 0.6 MG  TABS (COLCHICINE) two times a day  #100 x prn   Entered and Authorized by:   Neena Rhymes MD   Signed by:   Neena Rhymes MD on 03/30/2007   Method used:   Electronically sent to ...       [facility] [place], [place]  [address]       Ph: [phone]  Fax: [phone]   RxID:   [id number] SIMVASTATIN 40 MG  TABS (SIMVASTATIN) Every 2-3 days  #30 x prn   Entered and Authorized by:   Neena Rhymes MD   Signed by:   Neena Rhymes MD on 03/30/2007   Method used:   Electronically sent to ...       [facility] [place], [place]  [address]       Ph:  [phone]       Fax: [phone]   RxID:   [id number] PLAVIX 75 MG  TABS (CLOPIDOGREL BISULFATE) Every 2-3 days  #30 x prn   Entered and Authorized by:   Neena Rhymes MD   Signed by:   Neena Rhymes MD on 03/30/2007   Method used:   Electronically sent to ...       [facility]  [place] [place]  [address]       Ph: [phone]       Fax: [phone]   RxID:   [id number]  ]

## 2010-05-08 NOTE — Progress Notes (Signed)
Summary: REFILL  Phone Note Refill Request   Refills Requested: Medication #1:  AMBIEN CR 12.5 MG  TBCR Take 1 tab by mouth at bedtime Initial call taken by: Charlsie Quest,  October 06, 2007 5:07 PM  Follow-up for Phone Call        OK for refill  #30, sig 1 by mouth at bedtime as needed, 5 refills. Follow-up by: Neena Rhymes MD,  October 07, 2007 6:46 AM      Prescriptions: AMBIEN CR 12.5 MG  TBCR (ZOLPIDEM TARTRATE) Take 1 tab by mouth at bedtime  #30 x 5   Entered by:   Estell Harpin CMA   Authorized by:   Neena Rhymes MD   Signed by:   Estell Harpin CMA on 10/07/2007   Method used:   [facility]ed [facility] ...       [facility] [place], [place]  [address]       Ph: [phone]       Fax: [phone]   RxID:   [id number]
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
## 2010-05-08 NOTE — Letter (Signed)
Summary: [facility]  [facility]   Imported By: Bubba Hales 05/08/2009 09:45:20  _____________________________________________________________________  External Attachment:    Type:   Image     Comment:   External Document
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
## 2010-05-08 NOTE — Assessment & Plan Note (Signed)
Summary: back pain/cd   Vital Signs:  Patient profile:   65 year old male Height:      69 inches Weight:      166 pounds BMI:     24.60 O2 Sat:      93 % on Room air Temp:     97.4 degrees F oral Pulse rate:   87 / minute BP sitting:   142 / 96  (left arm) Cuff size:   regular  Vitals Entered By: Charlynne Cousins CMA (May 17, 2009 9:55 AM)  O2 Flow:  Room air CC: pt here for office visit to discuss issue with back pain, pt is taking nuerontin but is unsure of dose will call with an update/ ab   Primary Care Provider:  Neena Rhymes MD  CC:  pt here for office visit to discuss issue with back pain and pt is taking nuerontin but is unsure of dose will call with an update/ ab.  History of Present Illness: Returns for follow-up of pain management: He has been doing better. He is still vdery uncomfortable at times. He has dense paresthesia from left buttock to lateral knee. He has been using TENS unit with good result.He is interested in alternative therapy - provided rx slip for Integrative therapy and the Name and tele # for Camillia Herter in [facility].   Current regimen: Oxdycontin 20 mg three times a day, hydrocodone/APAP 5/325 1 or 2 per day.  Bowels - not constipated. Using stool softeners and miralax daily  Function - he has resumed working and his activities.   Current Medications (verified): 1)  Plavix 75 Mg  Tabs (Clopidogrel Bisulfate) .... Every 2-3 Days 2)  Simvastatin 40 Mg  Tabs (Simvastatin) .... Every 2-3 Days 3)  Colchicine 0.6 Mg  Tabs (Colchicine) .... Two Times A Day 4)  Cozaar 50 Mg  Tabs (Losartan Potassium) .... Take 1 Tablet By Mouth Once A Day 5)  Hydrochlorothiazide 25 Mg  Tabs (Hydrochlorothiazide) .... 1/2 By Mouth Once Daily 6)  Celebrex 200 Mg  Caps (Celecoxib) .... Take 1 Tablet By Mouth Two Times A Day 7)  Protonix 40 Mg  Tbec (Pantoprazole Sodium) .... Take 1 Tablet By Mouth Once A Day 8)  Ambien Cr 12.5 Mg  Tbcr (Zolpidem Tartrate) .... Take 1 Tab  By Mouth At Bedtime 9)  Vitamin E 400 Unit  Caps (Vitamin E) .... Take 1 Tablet By Mouth Once A Day 10)  Omega 3 Fish Oil 1000 .... Take 1 Tablet By Mouth Two Times A Day 11)  Aspirin Ec Low Dose 81 Mg  Tbec (Aspirin) .[name].. 1 Once Daily 12)  Gas-X Extra Strength 125 Mg  Caps (Simethicone) .... As Directed 13)  Oxycontin 20 Mg Xr12h-Tab (Oxycodone Hcl) .[name].. 1 Three Times A Day Fill On or After 05/09/09 14)  Alprazolam Xr 1 Mg  Xr24h-Tab (Alprazolam) .[name].. 1 By Mouth Once Daily 15)  Oxycodone-Acetaminophen 5-325 Mg Tabs (Oxycodone-Acetaminophen) .[name].. 1 Q 6 Hours As Needed Breakthru Pain Fill On or After 05/09/2009 16)  Alprazolam 0.5 Mg Tabs (Alprazolam) .[name].. 1 By Mouth Once Daily As Needed. 17)  Leflunomide 10 Mg Tabs (Leflunomide) .[name].. 1 By Mouth Daily 18)  Prednisone 5 Mg Tabs (Prednisone) .[name].. 1 By Mouth Daily 19)  Multivitamins   Tabs (Multiple Vitamin) .[name].. 1 By Mouth Daily 20)  Diazepam 5 Mg/ml Soln (Diazepam) .... Every 6 Hours  Allergies (verified): 1)  ! Pcn PMH-FH-SH reviewed-no changes except otherwise noted  Review of  Systems       The patient complains of difficulty walking.  The patient denies anorexia, fever, weight loss, weight gain, chest pain, dyspnea on exertion, abdominal pain, muscle weakness, unusual weight change, and enlarged lymph nodes.         no constipation, no excessive somnolence  Physical Exam  General:  Well-developed,well-nourished,in no acute distress; alert,appropriate and cooperative throughout examination. He is wearing a back brace Eyes:  pupils equal, pupils round, and pupils react to accomodation.   Lungs:  normal respiratory effort and normal breath sounds.   Heart:  normal rate and regular rhythm.   Msk:  wearing back brace Neurologic:  alert & oriented X3 and cranial nerves II-XII intact.   Skin:  turgor normal and color normal.   Cervical Nodes:  no anterior cervical adenopathy and no posterior cervical adenopathy.   Psych:  Oriented X3,  memory intact for recent and remote, normally interactive, and good eye contact.     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 He says he was told that as a result of his surgery he has "nerve irritation." He is stable on his current pain regimen.  Plan - he will continue his present pain meds with goal of reducing oxycontin to presurgery levels.           alternative medicine consult (accupuncture): integrative therapy or Camillia Herter, accupuncturist/herbologist [place] [address]  His updated medication list for this problem includes:    Celebrex 200 Mg Caps (Celecoxib) .[name]... Take 1 tablet by mouth two times a day    Aspirin Ec Low Dose 81 Mg Tbec (Aspirin) .[name]... 1 once daily    Oxycontin 20 Mg Xr12h-tab (Oxycodone hcl) .[name]... 1 three times a day fill on or after 05/09/09    Oxycodone-acetaminophen 5-325 Mg Tabs (Oxycodone-acetaminophen) .[name]... 1 q 6 hours as needed breakthru pain fill on or after 05/09/2009  Problem # 2:  counseling re: family member Patient informs me of a 30 year old brother diagnosed with severe depression with psychotic features at age 54. He has been on long term benzodiazepines and what sounds like haldol and cogentin. the brother has lived continuously with the mother, who is getting old and frail. Recently the brother ran away. He was located in a shelter in [place] and is returning to [facility]. The question asked is best plan for long term management and possible group home at the time his mother becomes unable to help in his care.   I recommended psychiatric consult ASAP, best before restarting medication.   (20 min consult on family issues. )  Complete Medication List: 1)  Plavix 75 Mg Tabs (Clopidogrel bisulfate) .... Every 2-3 days 2)  Simvastatin 40 Mg Tabs (Simvastatin) .... Every 2-3 days 3)  Colchicine 0.6 Mg Tabs (Colchicine) .... Two times a day 4)  Cozaar  50 Mg Tabs (Losartan potassium) .... Take 1 tablet by mouth once a day 5)  Hydrochlorothiazide 25 Mg Tabs (Hydrochlorothiazide) .... 1/2 by mouth once daily 6)  Celebrex 200 Mg Caps (Celecoxib) .... Take 1 tablet by mouth two times a day 7)  Protonix 40 Mg Tbec (Pantoprazole sodium) .... Take 1 tablet by mouth once a day 8)  Ambien Cr 12.5 Mg Tbcr (Zolpidem tartrate) .... Take 1 tab by mouth at bedtime 9)  Vitamin E 400 Unit Caps (Vitamin e) .... Take 1 tablet by mouth once a day 10)  Omega 3 Fish Oil 1000  .... Take 1 tablet by mouth two times a day 11)  Aspirin Ec Low Dose 81 Mg Tbec (Aspirin) .[name].. 1 once daily 12)  Gas-x Extra Strength 125 Mg Caps (Simethicone) .... As directed 13)  Oxycontin 20 Mg Xr12h-tab (Oxycodone hcl) .[name].. 1 three times a day fill on or after 05/09/09 14)  Alprazolam Xr 1 Mg Xr24h-tab (Alprazolam) .[name].. 1 by mouth once daily 15)  Oxycodone-acetaminophen 5-325 Mg Tabs (Oxycodone-acetaminophen) .[name].. 1 q 6 hours as needed breakthru pain fill on or after 05/09/2009 16)  Alprazolam 0.5 Mg Tabs (Alprazolam) .[name].. 1 by mouth once daily as needed. 17)  Leflunomide 10 Mg Tabs (Leflunomide) .[name].. 1 by mouth daily 18)  Prednisone 5 Mg Tabs (Prednisone) .[name].. 1 by mouth daily 19)  Multivitamins Tabs (Multiple vitamin) .[name].. 1 by mouth daily 20)  Diazepam 5 Mg/ml Soln (Diazepam) .... Every 6 hours  Appended Document: back pain/cd for this visit a total of 35 minutes was spent on all problems

## 2010-05-08 NOTE — Assessment & Plan Note (Signed)
Summary: left leg swelling / SD   Vital Signs:  Patient profile:   65 year old male Height:      69 inches Weight:      173 pounds BMI:     25.64 O2 Sat:      96 % on Room air Temp:     97.0 degrees F oral Pulse rate:   91 / minute BP sitting:   146 / 84  (left arm) Cuff size:   regular  Vitals Entered By: Ami Bullins CMA (April 10, 2009 4:30 PM)  O2 Flow:  Room air CC: pt c/o bilateral leg swelling, swelling worse on left leg. Pt increased his HCTZ  with no  relief of symptoms/ ab   Primary Care Provider:  Neena Rhymes MD  CC:  pt c/o bilateral leg swelling and swelling worse on left leg. Pt increased his HCTZ  with no  relief of symptoms/ ab.  History of Present Illness: Patient represents for progressive swelling of the left leg. He was seen Dec 30 for pain management and evaluation of his legs. He came to [facility] venous doppler Dec 31st - normal study.  His pain control has been much better with celebrex, gabapentin and oxycontin 26m three times a day requiring 1 or 2 7.573mpercocet daily.  Current Medications (verified): 1)  Plavix 75 Mg  Tabs (Clopidogrel Bisulfate) .... Every 2-3 Days 2)  Simvastatin 40 Mg  Tabs (Simvastatin) .... Every 2-3 Days 3)  Colchicine 0.6 Mg  Tabs (Colchicine) .... Two Times A Day 4)  Cozaar 50 Mg  Tabs (Losartan Potassium) .... Take 1 Tablet By Mouth Once A Day 5)  Hydrochlorothiazide 25 Mg  Tabs (Hydrochlorothiazide) .... 1/2 By Mouth Once Daily 6)  Celebrex 200 Mg  Caps (Celecoxib) .... Take 1 Tablet By Mouth Two Times A Day 7)  Protonix 40 Mg  Tbec (Pantoprazole Sodium) .... Take 1 Tablet By Mouth Once A Day 8)  Ambien Cr 12.5 Mg  Tbcr (Zolpidem Tartrate) .... Take 1 Tab By Mouth At Bedtime 9)  Vitamin E 400 Unit  Caps (Vitamin E) .... Take 1 Tablet By Mouth Once A Day 10)  Omega 3 Fish Oil 1000 .... Take 1 Tablet By Mouth Two Times A Day 11)  Aspirin Ec Low Dose 81 Mg  Tbec (Aspirin) ...[name] 1 Once Daily 12)  Gas-X Extra Strength 125 Mg   Caps (Simethicone) .... As Directed 13)  Oxycontin 40 Mg Xr12h-Tab (Oxycodone Hcl) ...[name] 1 By Mouth Three Times A Day. 14)  Alprazolam Xr 1 Mg  Xr24h-Tab (Alprazolam) ...[name] 1 By Mouth Once Daily 15)  Oxycodone Hcl 5 Mg  Tabs (Oxycodone Hcl) ...[name] 1 By Mouth Q 4 As Needed Breakthrough Pain. Fill On or After 06/26/2009 16)  Alprazolam 0.5 Mg Tabs (Alprazolam) ...[name] 1 By Mouth Once Daily As Needed. 17)  Leflunomide 10 Mg Tabs (Leflunomide) ...[name] 1 By Mouth Daily 18)  Prednisone 5 Mg Tabs (Prednisone) ...[name] 1 By Mouth Daily 19)  Multivitamins   Tabs (Multiple Vitamin) ...[name] 1 By Mouth Daily 20)  Diazepam 5 Mg/ml Soln (Diazepam) .... Every 6 Hours  Allergies (verified): 1)  ! Pcn PMH-FH-SH reviewed-no changes except otherwise noted  Review of Systems  The patient denies anorexia, fever, weight loss, weight gain, chest pain, syncope, dyspnea on exertion, and prolonged cough.    Physical Exam  General:  Well-developed,well-nourished,in no acute distress; alert,appropriate and cooperative throughout examination Head:  normocephalic and atraumatic.   Lungs:  normal  respiratory effort and normal breath sounds.   Heart:  normal rate and regular rhythm.   Msk:  normal ROM, no joint swelling, and no joint warmth.   Extremities:  2+ pitting edema left leg up to he knee and worse at the ankle.    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 this is progressive during the day and improves overnight. He has been ruled out for DVT. Suspect venous valvular failure.  Plan - support stockings           elevation of legs during the day.            reassurance  Complete Medication List: 1)  Plavix 75 Mg Tabs (Clopidogrel bisulfate) .... Every 2-3 days 2)  Simvastatin 40 Mg Tabs (Simvastatin) .... Every 2-3 days 3)  Colchicine 0.6 Mg Tabs (Colchicine) .... Two times a day 4)  Cozaar 50 Mg Tabs (Losartan  potassium) .... Take 1 tablet by mouth once a day 5)  Hydrochlorothiazide 25 Mg Tabs (Hydrochlorothiazide) .... 1/2 by mouth once daily 6)  Celebrex 200 Mg Caps (Celecoxib) .... Take 1 tablet by mouth two times a day 7)  Protonix 40 Mg Tbec (Pantoprazole sodium) .... Take 1 tablet by mouth once a day 8)  Ambien Cr 12.5 Mg Tbcr (Zolpidem tartrate) .... Take 1 tab by mouth at bedtime 9)  Vitamin E 400 Unit Caps (Vitamin e) .... Take 1 tablet by mouth once a day 10)  Omega 3 Fish Oil 1000  .... Take 1 tablet by mouth two times a day 11)  Aspirin Ec Low Dose 81 Mg Tbec (Aspirin) .[name].. 1 once daily 12)  Gas-x Extra Strength 125 Mg Caps (Simethicone) .... As directed 13)  Oxycontin 40 Mg Xr12h-tab (Oxycodone hcl) .[name].. 1 by mouth three times a day. 14)  Alprazolam Xr 1 Mg Xr24h-tab (Alprazolam) .[name].. 1 by mouth once daily 15)  Percocet 7.5-325 Mg Tabs (Oxycodone-acetaminophen) .[name].. 1 q 4 hours as needed break thru pain 16)  Alprazolam 0.5 Mg Tabs (Alprazolam) .[name].. 1 by mouth once daily as needed. 17)  Leflunomide 10 Mg Tabs (Leflunomide) .[name].. 1 by mouth daily 18)  Prednisone 5 Mg Tabs (Prednisone) .[name].. 1 by mouth daily 19)  Multivitamins Tabs (Multiple vitamin) .[name].. 1 by mouth daily 20)  Diazepam 5 Mg/ml Soln (Diazepam) .... Every 6 hours Prescriptions: PERCOCET 7.5-325 MG TABS (OXYCODONE-ACETAMINOPHEN) 1 q 4 hours as needed break thru pain  #60 x 0   Entered by:   Charlsie Quest, CMA   Authorized by:   Neena Rhymes MD   Signed by:   Charlsie Quest, CMA on 04/10/2009   Method used:   Print then Give to Patient   RxID:   [id number]

## 2010-05-08 NOTE — Letter (Signed)
Summary: CAD,dyslipidemia,hemochromatosis/[facility]  CAD,dyslipidemia,hemochromatosis/[facility]   Imported By: Bubba Hales 02/19/2008 11:48:28  _____________________________________________________________________  External Attachment:    Type:   Image     Comment:   External Document

## 2010-05-08 NOTE — Progress Notes (Signed)
Summary: PAIN MED RFs  Phone Note Refill Request Call back at Home Phone [phone] Call back at [phone]   Refills Requested: Medication #1:  OXYCONTIN 10 MG  XR12H-TAB 2 tabs in AM and 1 tab in PM for chronic pain management. To be filled on or after 02/14/2008  Medication #2:  OXYCODONE HCL 5 MG  TABS 1 by mouth q 4 as needed breakthrough pain. Initial call taken by: Charlsie Quest,  February 23, 2008 9:23 AM  Follow-up for Phone Call        OK to do refills with three months of Rx's for chronic pain mangement. Follow-up by: Neena Rhymes MD,  February 23, 2008 1:16 PM  Additional Follow-up for Phone Call Additional follow up Details #1::        left mess to call office back  Additional Follow-up by: Charlsie Quest,  February 24, 2008 2:33 PM    Additional Follow-up for Phone Call Additional follow up Details #2::    Per Charlsie Quest Rx picked up by pt. Follow-up by: Iran Planas CMA,  February 29, 2008 4:06 PM  New/Updated Medications: OXYCONTIN 10 MG  XR12H-TAB (OXYCODONE HCL) 2 tabs in AM and 1 tab in PM for chronic pain management. To be filled on or after 03/15/2008 OXYCONTIN 10 MG  XR12H-TAB (OXYCODONE HCL) 2 tabs in AM and 1 tab in PM for chronic pain management. To be filled on or after 04/15/2008 OXYCONTIN 10 MG  XR12H-TAB (OXYCODONE HCL) 2 tabs in AM and 1 tab in PM for chronic pain management. To be filled on or after 05/16/2008 OXYCODONE HCL 5 MG  TABS (OXYCODONE HCL) 1 by mouth q 4 as needed breakthrough pain. Fill on or after 02/23/08 OXYCODONE HCL 5 MG  TABS (OXYCODONE HCL) 1 by mouth q 4 as needed breakthrough pain. Fill on or after 03/24/08 OXYCODONE HCL 5 MG  TABS (OXYCODONE HCL) 1 by mouth q 4 as needed breakthrough pain. Fill on or after 04/24/08   Prescriptions: OXYCODONE HCL 5 MG  TABS (OXYCODONE HCL) 1 by mouth q 4 as needed breakthrough pain. Fill on or after 04/24/08  #60 x 0   Entered by:   Charlsie Quest   Authorized by:   Neena Rhymes  MD   Signed by:   Charlsie Quest on 02/24/2008   Method used:   Print then Give to Patient   RxID:   [id number] OXYCODONE HCL 5 MG  TABS (OXYCODONE HCL) 1 by mouth q 4 as needed breakthrough pain. Fill on or after 03/24/08  #60 x 0   Entered by:   Charlsie Quest   Authorized by:   Neena Rhymes MD   Signed by:   Charlsie Quest on 02/24/2008   Method used:   Print then Give to Patient   RxID:   [id number] OXYCODONE HCL 5 MG  TABS (OXYCODONE HCL) 1 by mouth q 4 as needed breakthrough pain. Fill on or after 02/23/08  #60 x 0   Entered by:   Charlsie Quest   Authorized by:   Neena Rhymes MD   Signed by:   Charlsie Quest on 02/24/2008   Method used:   Print then Give to Patient   RxID:   [id number] OXYCONTIN 10 MG  XR12H-TAB (OXYCODONE HCL) 2 tabs in AM and 1 tab in PM for chronic pain management. To be filled on or after 05/16/2008  #90 x 0   Entered by:   Charlsie Quest   Authorized  by:   Neena Rhymes MD   Signed by:   Charlsie Quest on 02/24/2008   Method used:   Print then Give to Patient   RxID:   [id number] OXYCONTIN 10 MG  XR12H-TAB (OXYCODONE HCL) 2 tabs in AM and 1 tab in PM for chronic pain management. To be filled on or after 04/15/2008  #90 x 0   Entered by:   Charlsie Quest   Authorized by:   Neena Rhymes MD   Signed by:   Charlsie Quest on 02/24/2008   Method used:   Print then Give to Patient   RxID:   [id number] OXYCONTIN 10 MG  XR12H-TAB (OXYCODONE HCL) 2 tabs in AM and 1 tab in PM for chronic pain management. To be filled on or after 03/15/2008  #90 x 0   Entered by:   Charlsie Quest   Authorized by:   Neena Rhymes MD   Signed by:   Charlsie Quest on 02/24/2008   Method used:   Print then Give to Patient   RxID:   [id number]

## 2010-05-08 NOTE — Progress Notes (Signed)
Summary: [name] request  Phone Note Refill Request Message from:  Fax from Pharmacy on June 26, 2009 4:23 PM  Refills Requested: Medication #1:  AMBIEN CR 12.5 MG  TBCR Take 1 tab by mouth at bedtime   Last Refilled: 12/14/2008   Notes: last given #30/5rf  Is this ok to refill?  Initial call taken by: Estell Harpin CMA,  June 26, 2009 4:24 PM  Follow-up for Phone Call        ok to refill x 5 Follow-up by: Neena Rhymes MD,  June 27, 2009 5:34 PM    Prescriptions: AMBIEN CR 12.5 MG  TBCR (ZOLPIDEM TARTRATE) Take 1 tab by mouth at bedtime  #30 x 5   Entered by:   Ami Bullins CMA   Authorized by:   Neena Rhymes MD   Signed by:   Charlynne Cousins CMA on 06/28/2009   Method used:   Telephoned to ...       [facility] [phone]* (retail)       [address]       [place], [place]  [address]       Ph: [phone]       Fax: [phone]   RxID:   [id number]

## 2010-05-08 NOTE — Progress Notes (Signed)
  Phone Note Other Incoming   Caller: Pt [phone] Summary of Call: Pt recieved his prescriptions. He wants his Oxycodone 5-348m changed to where it can be filled on 05/09/2009 before he goes out of town. Please Advise Initial call taken by: [place]  May 05, 2009 12:56 PM  Follow-up for Phone Call        Hmmmmmm- OK to fill 05/09/09 Follow-up by: MNeena RhymesMD,  May 05, 2009 2:14 PM  Additional Follow-up for Phone Call Additional follow up Details #1::        called pt and informed him to bring in old prescription on monday to get the new prescription with fill date on 05/09/2009. rx printed and put up till monday Additional Follow-up by: Ami Bullins CMA,  May 05, 2009 2:29 PM    Additional Follow-up for Phone Call Additional follow up Details #2::    thank you Follow-up by: MNeena RhymesMD,  May 05, 2009 3:35 PM  Additional Follow-up for Phone Call Additional follow up Details #3:: Details for Additional Follow-up Action Taken: pt came in today with old prescription. Pt was given new prescription and pete gave it to pt. Additional Follow-up by: Ami Bullins CMA,  May 08, 2009 1:24 PM  New/Updated Medications: OXYCODONE-ACETAMINOPHEN 5-325 MG TABS (OXYCODONE-ACETAMINOPHEN) 1 q 6 hours as needed breakthru pain Fill on or after 05/09/2009 Prescriptions: OXYCODONE-ACETAMINOPHEN 5-325 MG TABS (OXYCODONE-ACETAMINOPHEN) 1 q 6 hours as needed breakthru pain Fill on or after 05/09/2009  #90 x 0   Entered by:   Ami Bullins CMA   Authorized by:   MNeena RhymesMD   Signed by:   ACharlynne CousinsCMA on 05/05/2009   Method used:   Print then Give to Patient   RxID:   [id number]

## 2010-05-08 NOTE — Letter (Signed)
Summary: [facility]  [facility]   Imported By: Phillis Knack 06/26/2009 10:23:25  _____________________________________________________________________  External Attachment:    Type:   Image     Comment:   External Document

## 2010-05-08 NOTE — Miscellaneous (Signed)
Summary: Orders Update pft charges  Clinical Lists Changes  Orders: Added new Service order of Carbon Monoxide diffusing w/capacity (94720) - Signed Added new Service order of Lung Volumes (94240) - Signed Added new Service order of Spirometry (Pre & Post) (94060) - Signed 

## 2010-05-08 NOTE — Progress Notes (Signed)
Summary: RESULTS  Phone Note Call from Patient   Caller: [phone] Summary of Call: Patient is requesting results of MRI & last labs Initial call taken by: Charlsie Quest,  November 18, 2008 11:33 AM  Follow-up for Phone Call        patient called and reviewed the studies.Will refer to Dr. Joya Salm for spine consult and Dr. Berenice Primas for knee consult.  Follow-up by: Neena Rhymes MD,  November 21, 2008 9:25 AM

## 2010-05-08 NOTE — Progress Notes (Signed)
Summary: Refill request  Phone Note Refill Request Message from:  Fax from Pharmacy  rx refill request HYDROXYZINE PAM 50 MG #120    Follow-up for Phone Call        hydroxyzine is not on the patients current med list. Refill denied. Have pharmacy take it out of their automatic refill system Follow-up by: Neena Rhymes MD,  September 09, 2007 9:29 AM  Additional Follow-up for Phone Call Additional follow up Details #1::        Pharmacy notified per MD Additional Follow-up by: Estell Harpin CMA,  September 09, 2007 3:18 PM

## 2010-05-08 NOTE — Assessment & Plan Note (Signed)
Summary: DISCUSS LAB RESULTS/NWS $50   Vital Signs:  Patient Profile:   65 Years Old Male Height:     69 inches Weight:      156 pounds Temp:     98.1 degrees F oral Pulse rate:   80 / minute BP sitting:   146 / 90  (left arm) Cuff size:   regular  Vitals Entered By: Iran Planas CMA (January 08, 2008 10:51 AM)                 PCP:  Amirr Achord  Chief Complaint:  Follow up on labs/elevated BP readings.  History of Present Illness: Patient presents to review lab report: recent STD screening.    Current Allergies: ! 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 He is concerned about what to tell his partner in regard to infectivity.  I advised that there is a low, but real risk related to asymptomatic viral shedding, of transimission if he doesn't have any active lesions. Barrier protection is always advisable in a non-marital relationship.  Problem # 2:  HYPERTENSION (ICD-401.9)  His updated medication list for this problem includes:    Cozaar 50 Mg Tabs (Losartan potassium) .[name]... Take 1 tablet by mouth once a day    Hydrochlorothiazide 25 Mg Tabs (Hydrochlorothiazide) .[name]... 1/2 by mouth once daily  BP today: 146/90 Prior BP: 150/84 (12/15/2007)  Labs Reviewed: Creat: 0.6 (04/08/2007) Chol: 132 (04/08/2007)   HDL: 44.0 (04/08/2007)   LDL: 80 (04/08/2007)   TG: 38 (04/08/2007)  BPs have been running high. He has not been taking his HCTZ which was stopped at the time of his ankle surgery.  Plan: resume HCTZ, monitor BP readings.   Complete Medication List: 1)  Plavix 75 Mg Tabs (Clopidogrel bisulfate) .... Every 2-3 days 2)  Simvastatin 40 Mg Tabs (Simvastatin) .... Every 2-3 days 3)  Colchicine 0.6 Mg Tabs (Colchicine) .... Two times a day 4)  Cozaar 50 Mg Tabs (Losartan potassium) .... Take 1 tablet by mouth once a day 5)   Hydrochlorothiazide 25 Mg Tabs (Hydrochlorothiazide) .... 1/2 by mouth once daily 6)  Celebrex 200 Mg Caps (Celecoxib) .... Take 1 tablet by mouth two times a day 7)  Protonix 40 Mg Tbec (Pantoprazole sodium) .... Take 1 tablet by mouth once a day 8)  Ambien Cr 12.5 Mg Tbcr (Zolpidem tartrate) .... Take 1 tab by mouth at bedtime 9)  Vitamin E 400 Unit Caps (Vitamin e) .... Take 1 tablet by mouth once a day 10)  Omega 3 Fish Oil 1000  .... Take 1 tablet by mouth two times a day 11)  Aspirin Ec Low Dose 81 Mg Tbec (Aspirin) .[name].. 1 once daily 12)  Gas-x Extra Strength 125 Mg Caps (Simethicone) .... As directed 13)  Oxycodone Hcl 5 Mg Tabs (Oxycodone hcl) .[name].. 24m-15mg daily 14)  Oxycontin 10 Mg Xr12h-tab (Oxycodone hcl) .... 2 tabs in am and 1 tab in pm for chronic pain management. to be filled on or after 02/14/2008 15)  Alprazolam Xr 1 Mg Xr24h-tab (Alprazolam) ..[name]. 1 by mouth once daily 16)  Oxycodone Hcl 5 Mg Tabs (Oxycodone hcl) ..[name]. 1 by mouth q 4 as needed breakthrough pain. 17)  Alprazolam 0.5 Mg Tabs (Alprazolam) ..[name]. 1 by mouth once daily as needed.    ]

## 2010-05-08 NOTE — Progress Notes (Signed)
Summary: Uric Acid  ---- Converted from flag ---- ---- 04/19/2007 5:58 PM, Neena Rhymes MD wrote: please call patient: uric acid level was normal. This makes gout unlikely. ------------------------------  Called pt [phone] cell, and advised of above note. Phone Note Outgoing Call

## 2010-05-08 NOTE — Assessment & Plan Note (Signed)
Summary: PHYSICAL---STC   Vital Signs:  Patient profile:   65 year old male Height:      65.5 inches Weight:      172 pounds BMI:     28.29 O2 Sat:      96 % on Room air Temp:     97.0 degrees F oral Pulse rate:   90 / minute BP sitting:   138 / 80  (left arm) Cuff size:   regular  Vitals Entered By: Charlynne Cousins CMA (October 16, 2009 10:48 AM)  O2 Flow:  Room air CC: cpx  Vision Screening:      Vision Comments: Pt had cateract surg on Left eye July 2011 with Dr Herbert Deaner   Primary Care Provider:  Neena Rhymes MD  CC:  cpx.  History of Present Illness: Patient presents for general medical exam. He has had multiple surgeries: did well with back surgery, just had cataract surgery. His pain is well controlled. He has no new complaints  Current Medications (verified): 1)  Plavix 75 Mg  Tabs (Clopidogrel Bisulfate) .... Every 2-3 Days 2)  Simvastatin 40 Mg  Tabs (Simvastatin) .... Every 2-3 Days 3)  Colchicine 0.6 Mg  Tabs (Colchicine) .... Two Times A Day 4)  Cozaar 50 Mg  Tabs (Losartan Potassium) .... Take 1 Tablet By Mouth Once A Day 5)  Hydrochlorothiazide 25 Mg  Tabs (Hydrochlorothiazide) .... 1/2 By Mouth Once Daily 6)  Celebrex 200 Mg  Caps (Celecoxib) .... Take 1 Tablet By Mouth Two Times A Day 7)  Protonix 40 Mg  Tbec (Pantoprazole Sodium) .... Take 1 Tablet By Mouth Once A Day 8)  Ambien Cr 12.5 Mg  Tbcr (Zolpidem Tartrate) .... Take 1 Tab By Mouth At Bedtime 9)  Vitamin E 400 Unit  Caps (Vitamin E) .... Take 1 Tablet By Mouth Once A Day 10)  Omega 3 Fish Oil 1000 .... Take 1 Tablet By Mouth Two Times A Day 11)  Aspirin Ec Low Dose 81 Mg  Tbec (Aspirin) .[name].. 1 Once Daily 12)  Gas-X Extra Strength 125 Mg  Caps (Simethicone) .... As Directed 13)  Oxycontin 10 Mg Xr12h-Tab (Oxycodone Hcl) .[name].. 1 By Mouth Three Times A Day Fill On or After 09/17/2009 14)  Alprazolam Xr 1 Mg  Xr24h-Tab (Alprazolam) .[name].. 1 By Mouth Once Daily 15)  Oxycodone-Acetaminophen 5-325 Mg Tabs  (Oxycodone-Acetaminophen) .[name].. 1 Q 6 Hours As Needed Breakthru Pain  Fill On or After 09/17/2009 16)  Alprazolam 0.5 Mg Tabs (Alprazolam) .[name].. 1 By Mouth Once Daily As Needed. 17)  Leflunomide 10 Mg Tabs (Leflunomide) .[name].. 1 By Mouth Daily 18)  Prednisone 5 Mg Tabs (Prednisone) .[name].. 1 By Mouth Daily 19)  Multivitamins   Tabs (Multiple Vitamin) .[name].. 1 By Mouth Daily 20)  Diazepam 5 Mg/ml Soln (Diazepam) .... Every 6 Hours  Allergies (verified): 1)  ! Pcn  Past History:  Past Medical History: Last updated: 02/14/2009 UCD Allergic rhinitis Colonic polyps, hx of Diverticulitis, hx of Hypertension Osteoarthritis Hemochromatosis: dx'd 14 years ago last ferritin Aug 11,'08 52 (22-322), Fe 136 CAD-(Minimal coronary plaque in the LAD and right coronary system.  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 July 2006.  Last nuclear stress Aug 11.'08 fixed anterior/inferior defect, no inducible ischemia, EF 81%)      . Physician Roster:         [place] surgeon -Sypher  GS- Mat Martin         ortho- Bednarz/ankle, Dean-shoulder         Card - [place] ([facility]-Dr. 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 Foot surgery for removal of bone spurs right foot Nov '08 Reconstruction of Right ankle June '09 ([facility]) L4-5 diskectomy with fusion, cage placement and rods Dec '10 Joya Salm)  Family History: Last updated: 02/27/2009 Father - died 13; complications of CAD, prostate cancer, HTN Mother - 1; cancer survivor uterine, ankle edema, macular degeneration 1 brother - high Lipids,  schizophrenic, dependent 1 sister - thyroid diseae Neg - colon cancer, DM, no MI.  Social History: [employer] - BA Married 1972-80yr seperated/divorced. 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 Very busy life- some stress. During '11 discovered an [employer]- $500,000+ loss. Is handling this OK - will keep the business running.  Review of Systems       The patient complains of peripheral edema, muscle weakness, and difficulty walking.  The patient denies anorexia, fever, weight loss, weight gain, vision loss, decreased hearing, chest pain, syncope, dyspnea on exertion, prolonged cough, hemoptysis, abdominal pain, hematuria, incontinence, suspicious skin lesions, depression, abnormal bleeding, and enlarged lymph nodes.    Physical Exam  General:  Very pleasant well nourished, well groomed wite male in no distress Head:  normocephalic and atraumatic.   Eyes:  vision grossly intact.  imperceptable IOL's after cataract surgery. C&S clear Ears:  External ear exam shows no significant lesions or deformities.  Otoscopic examination reveals clear canals, tympanic membranes are intact bilaterally without bulging, retraction, inflammation or discharge. Hearing is grossly normal bilaterally. Nose:  no external deformity and no external erythema.   Mouth:  Oral mucosa and oropharynx without lesions or exudates.  Teeth in good repair. Neck:  supple, no thyromegaly, and no carotid bruits.   Chest Wall:  no deformities and no tenderness.   Lungs:  normal respiratory effort, normal breath sounds, no crackles, and no wheezes.   Heart:  normal rate, regular rhythm, no murmur, and no JVD.   Abdomen:  soft, normal bowel sounds, no masses, no guarding, and no hepatomegaly.   Rectal:  deferred to GU Prostate:  Deferred to GU Msk:  Decreased ROM shoulders. Normal gait- not using an  supportive device. minimal limp. no joint swelling, no joint warmth, and no redness over joints.   Pulses:  2+ radial Extremities:  No peripheral edema on exam Neurologic:  alert & oriented X3, cranial nerves II-XII intact, and DTRs symmetrical and normal.   Skin:  turgor normal, color normal, no rashes, and no ulcerations.   Cervical Nodes:  no anterior cervical adenopathy and no posterior cervical adenopathy.   Psych:  Oriented X3, memory intact for recent and remote, normally interactive, good eye contact, and not anxious appearing.     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 He does use support stockings  Problem # 2:  DEGENERATIVE DISC DISEASE, LUMBOSACRAL SPINE (ICD-722.52) Patient's pain and radiculopathy is improved after surgery in December. His  initial loss of movement resolved. He has made a full recovery from surgery.  Problem # 3:  RHEUMATOID ARTHRITIS (ICD-714.0) No active flare: no joint inflammation or synovial changes.  Problem # 4:  ANXIETY (ICD-300.00) He is doing well under extraordinary circumstances with the embezzlement at his business. He continues taking Xanax XR daily and does supplement as needed with short acting alprazolam.  Plan - Rx renewed  His updated medication list for this problem includes:    Alprazolam Xr 1 Mg Xr24h-tab (Alprazolam) .[name]... 1 by mouth once daily    Alprazolam 0.5 Mg Tabs (Alprazolam) .[name]... 1 by mouth once daily as needed.    Diazepam 5 Mg/ml Soln (Diazepam) ..... Every 6 hours  Problem # 5:  PSEUDOGOUT (ICD-275.49) No recent gout flares.  Problem # 6:  DYSLIPIDEMIA (ICD-272.4)  Great control with LDL 82. He has no medication side effects.  Plan - continue present meds.  His updated medication list for this problem includes:    Simvastatin 40 Mg Tabs (Simvastatin) ..... Every 2-3 days  Labs Reviewed: SGOT: 17 (10/06/2009)   SGPT: 16 (10/06/2009)    HDL:43.90 (10/06/2009), 49.00 (10/11/2008)  LDL:82 (10/06/2009), 89 (10/11/2008)  Chol:134 (10/06/2009), 144 (10/11/2008)  Trig:40.0 (10/06/2009), 28.0 (10/11/2008)  Problem # 7:  CAD (ICD-414.00) Stable with no report of chest pain or cardiac symptoms. Risk reduction is going well.  Plan - continue present regimen.  His updated medication list for this problem includes:    Plavix 75 Mg Tabs (Clopidogrel bisulfate) ..... Every 2-3 days    Cozaar 50 Mg Tabs (Losartan potassium) .[name]... Take 1 tablet by mouth once a day    Hydrochlorothiazide 25 Mg Tabs (Hydrochlorothiazide) .[name]... 1/2 by mouth once daily    Aspirin Ec Low Dose 81 Mg Tbec (Aspirin) .[name]... 1 once daily  Problem # 8:  HYPERTENSION (ICD-401.9)  His updated medication list for this problem includes:    Cozaar 50 Mg Tabs (Losartan potassium) .[name]... Take 1 tablet by mouth once a day    Hydrochlorothiazide 25 Mg Tabs (Hydrochlorothiazide) .[name]... 1/2 by mouth once daily  BP today: 138/80 Prior BP: 119/70 (06/09/2009)  Labs Reviewed: K+: 4.1 (10/06/2009) Creat: : 0.6 (10/06/2009)   Chol: 134 (10/06/2009)   HDL: 43.90 (10/06/2009)   LDL: 82 (10/06/2009)   TG: 40.0 (10/06/2009)  Adequate control on present medications.  Problem # 9:  Preventive Health Care (ICD-V70.0) Past year has included major surgeries and discomfort but he has done well with a good recovery and rehab. His exam is most notable for joint related issues and is normal in regard to systemic disease. Last colonoscopy was in '05. He is current is pneumovax and flu. May be due for tetnus. Lab results are fine. He remains independent in all ADLs and works a full and demanding schedule. His spirits are resiliant and positive.  In summary - a nice man who has multiple medical problems that appear stable at today's exam.   Complete Medication List: 1)  Plavix 75 Mg Tabs (Clopidogrel bisulfate) .... Every 2-3 days 2)  Simvastatin 40 Mg Tabs (Simvastatin) .... Every 2-3  days 3)  Colchicine 0.6 Mg Tabs (Colchicine) .... Two times a day 4)  Cozaar 50 Mg Tabs (Losartan potassium) .... Take 1 tablet by mouth once a day 5)  Hydrochlorothiazide 25 Mg Tabs (Hydrochlorothiazide) .... 1/2 by mouth once daily 6)  Celebrex 200 Mg Caps (Celecoxib) .... Take 1 tablet by mouth two times a day 7)  Protonix 40 Mg Tbec (Pantoprazole sodium) .Marland KitchenMarland KitchenMarland Kitchen  Take 1 tablet by mouth once a day 8)  Ambien Cr 12.5 Mg Tbcr (Zolpidem tartrate) .... Take 1 tab by mouth at bedtime 9)  Vitamin E 400 Unit Caps (Vitamin e) .... Take 1 tablet by mouth once a day 10)  Omega 3 Fish Oil 1000  .... Take 1 tablet by mouth two times a day 11)  Aspirin Ec Low Dose 81 Mg Tbec (Aspirin) .[name].. 1 once daily 12)  Gas-x Extra Strength 125 Mg Caps (Simethicone) .... As directed 13)  Oxycontin 10 Mg Xr12h-tab (Oxycodone hcl) .[name].. 1 by mouth three times a day fill on or after 12/18/2009 14)  Alprazolam Xr 1 Mg Xr24h-tab (Alprazolam) .[name].. 1 by mouth once daily 15)  Oxycodone-acetaminophen 5-325 Mg Tabs (Oxycodone-acetaminophen) .[name].. 1 q 6 hours as needed breakthru pain  fill on or after 12/18/2009 16)  Alprazolam 0.5 Mg Tabs (Alprazolam) .[name].. 1 by mouth once daily as needed. 17)  Leflunomide 10 Mg Tabs (Leflunomide) .[name].. 1 by mouth daily 18)  Prednisone 5 Mg Tabs (Prednisone) .[name].. 1 by mouth daily 19)  Multivitamins Tabs (Multiple vitamin) .[name].. 1 by mouth daily 20)  Diazepam 5 Mg/ml Soln (Diazepam) .... Every 6 hours  Patient: [name] Note: All result statuses are Final unless otherwise noted.  Tests: (1) BMP (METABOL)   Sodium                    139 mEq/L                   135-145   Potassium                 4.1 mEq/L                   3.5-5.1   Chloride                  102 mEq/L                   96-112   Carbon Dioxide            30 mEq/L                    19-32   Glucose              [H]  101 mg/dL                   70-99   BUN                       12 mg/dL                    6-23   Creatinine                 0.6 mg/dL                   0.4-1.5   Calcium                   8.7 mg/dL                   8.4-10.5   GFR                       144.88 mL/min               >60  Tests: (2) CBC Platelet w/Diff (  CBCD)   White Cell Count     [L]  4.3 K/uL                    4.5-10.5   Red Cell Count            4.22 Mil/uL                 4.22-5.81   Hemoglobin                13.5 g/dL                   13.0-17.0   Hematocrit                39.3 %                      39.0-52.0   MCV                       93.1 fl                     78.0-100.0   MCHC                      34.3 g/dL                   30.0-36.0   RDW                  [H]  15.4 %                      11.5-14.6   Platelet Count            271.0 K/uL                  150.0-400.0   Neutrophil %              67.0 %                      43.0-77.0   Lymphocyte %              18.5 %                      12.0-46.0   Monocyte %                11.2 %                      3.0-12.0   Eosinophils%              2.4 %                       0.0-5.0   Basophils %               0.9 %                       0.0-3.0   Neutrophill Absolute      2.9 K/uL                    1.4-7.7   Lymphocyte Absolute       0.8 K/uL                    0.7-4.0   Monocyte Absolute  0.5 K/uL                    0.1-1.0  Eosinophils, Absolute                             0.1 K/uL                    0.0-0.7   Basophils Absolute        0.0 K/uL                    0.0-0.1  Tests: (3) Hepatic/Liver Function Panel (HEPATIC)   Total Bilirubin           0.6 mg/dL                   0.3-1.2   Direct Bilirubin          0.2 mg/dL                   0.0-0.3   Alkaline Phosphatase      79 U/L                      39-117   AST                       17 U/L                      0-37   ALT                       16 U/L                      0-53   Total Protein             6.5 g/dL                    6.0-8.3   Albumin                   3.8 g/dL                    3.5-5.2  Tests: (4)  TSH (TSH)   FastTSH                   0.60 uIU/mL                 0.35-5.50  Tests: (5) Lipid Panel (LIPID)   Cholesterol               134 mg/dL                   0-200     ATP III Classification            Desirable:  < 200 mg/dL                    Borderline High:  200 - 239 mg/dL               High:  > = 240 mg/dL   Triglycerides             40.0 mg/dL                  0.0-149.0     Normal:  <150 mg/dL  Borderline High:  150 - 199 mg/dL   HDL                       43.90 mg/dL                 >39.00   VLDL Cholesterol          8.0 mg/dL                   0.0-40.0   LDL Cholesterol           82 mg/dL                    0-99  CHO/HDL Ratio:  CHD Risk                             3                    Men          Women     1/2 Average Risk     3.4          3.3     Average Risk          5.0          4.4     2X Average Risk          9.6          7.1     3X Average Risk          15.0          11.0                           Tests: (6) UDip Only (UDIP)   Color                     LT. YELLOW       RANGE:  Yellow;Lt. Yellow   Clarity                   CLEAR                       Clear   Specific Gravity          <=1.005                     1.000 - 1.030   Urine Ph                  7.0                         5.0-8.0   Protein                   NEGATIVE                    Negative   Urine Glucose             NEGATIVE                    Negative   Ketones                   NEGATIVE  Negative   Urine Bilirubin           NEGATIVE                    Negative   Blood                     NEGATIVE                    Negative   Urobilinogen              0.2                         0.0 - 1.0   Leukocyte Esterace        NEGATIVE                    Negative   Nitrite                   NEGATIVE                    Negative  Tests: (7) Prostate Specific Antigen (PSA)   PSA-Hyb                   1.77 ng/mL                  0.10-4.00Prescriptions: OXYCODONE-ACETAMINOPHEN 5-325 MG  TABS (OXYCODONE-ACETAMINOPHEN) 1 q 6 hours as needed breakthru pain  fill on or after 12/18/2009  #90 x 0   Entered by:   Ami Bullins CMA   Authorized by:   Neena Rhymes MD   Signed by:   Charlynne Cousins CMA on 10/16/2009   Method used:   Print then Give to Patient   RxID:   [id number] OXYCODONE-ACETAMINOPHEN 5-325 MG TABS (OXYCODONE-ACETAMINOPHEN) 1 q 6 hours as needed breakthru pain  fill on or after 11/17/2009  #90 x 0   Entered by:   Ami Bullins CMA   Authorized by:   Neena Rhymes MD   Signed by:   Charlynne Cousins CMA on 10/16/2009   Method used:   Print then Give to Patient   RxID:   [id number] OXYCODONE-ACETAMINOPHEN 5-325 MG TABS (OXYCODONE-ACETAMINOPHEN) 1 q 6 hours as needed breakthru pain  fill on or after 10/17/2009  #90 x 0   Entered by:   Ami Bullins CMA   Authorized by:   Neena Rhymes MD   Signed by:   Charlynne Cousins CMA on 10/16/2009   Method used:   Print then Give to Patient   RxID:   [id number] OXYCONTIN 10 MG XR12H-TAB (OXYCODONE HCL) 1 by mouth three times a day fill on or after 12/18/2009  #90 x 0   Entered by:   Ami Bullins CMA   Authorized by:   Neena Rhymes MD   Signed by:   Charlynne Cousins CMA on 10/16/2009   Method used:   Print then Give to Patient   RxID:   [id number] OXYCONTIN 10 MG XR12H-TAB (OXYCODONE HCL) 1 by mouth three times a day fill on or after 11/17/2009  #90 x 0   Entered by:   Ami Bullins CMA   Authorized by:   Neena Rhymes MD   Signed by:   Charlynne Cousins CMA on 10/16/2009   Method used:   Print then Give to Patient   RxID:   [id number] OXYCONTIN 10 MG XR12H-TAB (OXYCODONE  HCL) 1 by mouth three times a day fill on or after 10/17/2009  #90 x 0   Entered by:   Ami Bullins CMA   Authorized by:   Neena Rhymes MD   Signed by:   Charlynne Cousins CMA on 10/16/2009   Method used:   Print then Give to Patient   RxID:   [id number]    Immunization History:  Influenza Immunization History:    Influenza:   historical (01/10/2009)

## 2010-05-08 NOTE — Progress Notes (Signed)
Summary: Marlene Bast  Phone Note Call from Patient   Summary of Call: pt called states his insurance company is cutting back to approving only 1 ambien every 3 days....so he will no longer be covered for 30 a month.Marland KitchenMarland KitchenMarland KitchenYakov Bergen states he needs to stay on 30 a month esp now that he has sustained a shoulder replacement .Marland KitchenMarland KitchenMarland KitchenObe Ahlers wants to let us know that the pharmacy will be sending info for PA.....Marland Kitchenjust wants to let you know in advance and hopes you will assist him in appealing for a 30 days supply to approve PA......called pt back and let him know we will pass along PA info as soon as we get it for Dr. Linda Hedges to review....but did advise PA can take up to a couple of weeks to complete Initial call taken by: Hector Brunswick,  April 18, 2008 2:27 PM  Follow-up for Phone Call        noted and will be on the look out for PA Follow-up by: Neena Rhymes MD,  April 19, 2008 5:08 AM

## 2010-05-08 NOTE — Consult Note (Signed)
Summary: [place]   Imported By: Edmonia James 06/18/2007 09:49:53  _____________________________________________________________________  External Attachment:    Type:   Image     Comment:   External Document

## 2010-05-08 NOTE — Letter (Signed)
Summary: [facility]  [facility]   Imported By: Rise Patience 01/09/2010 15:22:06  _____________________________________________________________________  External Attachment:    Type:   Image     Comment:   External Document

## 2010-05-08 NOTE — Miscellaneous (Signed)
Summary: Orders Update   Clinical Lists Changes  Orders: Added new Referral order of Radiology Referral (Radiology) - Signed 

## 2010-05-08 NOTE — Progress Notes (Signed)
Summary: SEE PREVIOUS PHONE NOTE / MED REFILLS  Phone Note Call from Patient   Summary of Call: Patient notified, rx's printed and up front for pick up. Initial call taken by: Ernestene Mention,  March 16, 2009 8:28 AM    New/Updated Medications: OXYCONTIN 10 MG  XR12H-TAB (OXYCODONE HCL) 2 tabs in AM and 1 tab in PM for chronic pain management. To be filled on or after 04/15/2009 OXYCONTIN 10 MG  XR12H-TAB (OXYCODONE HCL) 2 tabs in AM and 1 tab in PM for chronic pain management. To be filled on or after 05/16/2009 OXYCONTIN 10 MG  XR12H-TAB (OXYCODONE HCL) 2 tabs in AM and 1 tab in PM for chronic pain management. To be filled on or after 06/13/2009 OXYCODONE HCL 5 MG  TABS (OXYCODONE HCL) 1 by mouth q 4 as needed breakthrough pain. Fill on or after 04/28/2009 OXYCODONE HCL 5 MG  TABS (OXYCODONE HCL) 1 by mouth q 4 as needed breakthrough pain. Fill on or after 05/29/2009 OXYCODONE HCL 5 MG  TABS (OXYCODONE HCL) 1 by mouth q 4 as needed breakthrough pain. Fill on or after 06/26/2009 Prescriptions: OXYCODONE HCL 5 MG  TABS (OXYCODONE HCL) 1 by mouth q 4 as needed breakthrough pain. Fill on or after 06/26/2009  #60 x 0   Entered by:   Charlsie Quest, CMA   Authorized by:   Neena Rhymes MD   Signed by:   Charlsie Quest, CMA on 03/15/2009   Method used:   Print then Give to Patient   RxID:   [id number] OXYCONTIN 10 MG  XR12H-TAB (OXYCODONE HCL) 2 tabs in AM and 1 tab in PM for chronic pain management. To be filled on or after 06/13/2009  #90 x 0   Entered by:   Charlsie Quest, CMA   Authorized by:   Neena Rhymes MD   Signed by:   Charlsie Quest, CMA on 03/15/2009   Method used:   Print then Give to Patient   RxID:   [id number] OXYCODONE HCL 5 MG  TABS (OXYCODONE HCL) 1 by mouth q 4 as needed breakthrough pain. Fill on or after 05/29/2009  #60 x 0   Entered by:   Charlsie Quest, CMA   Authorized by:   Neena Rhymes MD   Signed by:   Charlsie Quest, CMA on 03/15/2009   Method  used:   Print then Give to Patient   RxID:   [id number] OXYCONTIN 10 MG  XR12H-TAB (OXYCODONE HCL) 2 tabs in AM and 1 tab in PM for chronic pain management. To be filled on or after 05/16/2009  #90 x 0   Entered by:   Charlsie Quest, CMA   Authorized by:   Neena Rhymes MD   Signed by:   Charlsie Quest, CMA on 03/15/2009   Method used:   Print then Give to Patient   RxID:   [id number] ALPRAZOLAM XR 1 MG  XR24H-TAB (ALPRAZOLAM) 1 by mouth once daily  #30 x 5   Entered by:   Charlsie Quest, CMA   Authorized by:   Neena Rhymes MD   Signed by:   Charlsie Quest, CMA on 03/15/2009   Method used:   Print then Give to Patient   RxID:   [id number] ALPRAZOLAM 0.5 MG TABS (ALPRAZOLAM) 1 by mouth once daily as needed.  #90 x 5   Entered by:   Charlsie Quest, [place]   Authorized by:   Neena Rhymes MD   Signed by:  Charlsie Quest, CMA on 03/15/2009   Method used:   Print then Give to Patient   RxID:   [id number] OXYCODONE HCL 5 MG  TABS (OXYCODONE HCL) 1 by mouth q 4 as needed breakthrough pain. Fill on or after 04/28/2009  #60 x 0   Entered by:   Charlsie Quest, CMA   Authorized by:   Neena Rhymes MD   Signed by:   Charlsie Quest, CMA on 03/15/2009   Method used:   Print then Give to Patient   RxID:   [id number] OXYCONTIN 10 MG  XR12H-TAB (OXYCODONE HCL) 2 tabs in AM and 1 tab in PM for chronic pain management. To be filled on or after 04/15/2009  #90 x 0   Entered by:   Charlsie Quest, CMA   Authorized by:   Neena Rhymes MD   Signed by:   Charlsie Quest, CMA on 03/15/2009   Method used:   Print then Give to Patient   RxID:   [id number]

## 2010-05-08 NOTE — Progress Notes (Signed)
Summary: SAMPLE  Phone Note Call from Patient   Summary of Call: Pt walked in the office req samples of foradil. I see MD gave pt samples of med at last office visit. Provided another sample. Should we add this to EMR and send pt an rx into pharmacy?   PFT report scanned into EMR. Does pt need pulmonary referral?  Initial call taken by: Charlsie Quest, [place],  January 26, 2010 9:44 AM  Follow-up for Phone Call        thanks for providing sample. Add to medlist since it works for him. NO need for pulmonary referral at this time.  Follow-up by: Neena Rhymes MD,  January 29, 2010 8:47 AM    New/Updated Medications: FORADIL AEROLIZER 12 MCG CAPS (FORMOTEROL FUMARATE) 1 two times a day Prescriptions: FORADIL AEROLIZER 12 MCG CAPS (FORMOTEROL FUMARATE) 1 two times a day  #1 x 0   Entered by:   Charlsie Quest, CMA   Authorized by:   Neena Rhymes MD   Signed by:   Charlsie Quest, CMA on 01/29/2010   Method used:   Samples Given   RxID:   [id number]

## 2010-05-08 NOTE — Letter (Signed)
Summary: [facility]   Imported By: Jerrye Noble D'jimraou 03/25/2008 09:54:55  _____________________________________________________________________  External Attachment:    Type:   Image     Comment:   External Document

## 2010-05-08 NOTE — Letter (Signed)
Summary: [facility]   Imported By: Rise Patience 11/21/2009 16:01:17  _____________________________________________________________________  External Attachment:    Type:   Image     Comment:   External Document

## 2010-05-08 NOTE — Progress Notes (Signed)
Summary: PAIN MEDICATION  Phone Note Call from Patient Call back at Home Phone [phone]   Summary of Call: 1. Patient is requesting refill on percocet but would like to try a lower dose, 68m? 2. Pt wants refills of oxycontin 461m but wants to try a 2052mor a month before going back to his "normal" 36m30m Pt says he is doing physical therapy and continues to have almost constant pain.  Initial call taken by: SaraCharlsie QuestA,[place] 24, 2011 1:28 PM  Follow-up for Phone Call        1. last oxycontin Rx 04/09/09 40mg72mength, three times a day, #90. Due for next Rx May 09, 2009 - ok for 20mg 60mngth #90. 2. Percocet for breakthrough - last Rx 04/10/09 #60. Ok for refill with 5/325 q 6 hours for breakthrough - #90.  Please change med list when Rx' done.  Follow-up by: MichaeNeena RhymesJanuary 25, 2011 9:54 AM  Additional Follow-up for Phone Call Additional follow up Details #1::        left mess to call office back, rx up front, ready for pick up Additional Follow-up by: Sarah Charlsie Quest  May 02, 2009 4:28 PM    Additional Follow-up for Phone Call Additional follow up Details #2::    Patient notified up front for pick up. Follow-up by: Kelly Ernestene Mentionuary 26, 2011 10:07 AM  New/Updated Medications: OXYCONTIN 20 MG XR12H-TAB (OXYCODONE HCL) 1 three times a day Fill on or after 05/09/09 OXYCODONE-ACETAMINOPHEN 5-325 MG TABS (OXYCODONE-ACETAMINOPHEN) 1 q 6 hours as needed breakthru pain Fill on or after 05/11/2009 Prescriptions: OXYCODONE-ACETAMINOPHEN 5-325 MG TABS (OXYCODONE-ACETAMINOPHEN) 1 q 6 hours as needed breakthru pain Fill on or after 05/11/2009  #90 x 0   Entered by:   Sarah Charlsie Quest  Authorized by:   MichaeNeena RhymesSigned by:   Sarah Charlsie Queston 05/02/2009   Method used:   Print then Give to Patient   RxID:   [id number] 20 MG XR12H-TAB (OXYCODONE HCL) 1 three times a day Fill on or after 05/09/09  #90 x 0   Entered  by:   Sarah Charlsie Quest  Authorized by:   MichaeNeena RhymesSigned by:   Sarah Charlsie Queston 05/02/2009   Method used:   Print then Give to Patient   RxID:   [id number]

## 2010-05-08 NOTE — Progress Notes (Signed)
Summary: REFILLS / Controlled meds  Phone Note Refill Request   Refills Requested: Medication #1:  OXYCONTIN 10 MG  XR12H-TAB 2 tabs in AM and 1 tab in PM for chronic pain management. To be filled on or after 03/15/2009  Medication #2:  OXYCODONE HCL 5 MG  TABS 1 by mouth q 4 as needed breakthrough pain. Fill on or after 03/28/2009  Medication #3:  ALPRAZOLAM XR 1 MG  XR24H-TAB 1 by mouth once daily  Medication #4:  ALPRAZOLAM 0.5 MG TABS 1 by mouth once daily as needed. Patient is requesting scripts for 3 mths.   Initial call taken by: Charlsie Quest, CMA,  March 15, 2009 2:44 PM  Follow-up for Phone Call        ok to prepare 30 days Rx x 3 scripts on oxycontin and oxycodone.  [place] for alprazolam Rx's x 5  Thanks Follow-up by: Neena Rhymes MD,  March 15, 2009 4:37 PM

## 2010-05-08 NOTE — Progress Notes (Signed)
Summary: Weight Gain  Phone Note Call from Patient Call back at Home Phone [phone] Call back at [phone]   Summary of Call: Pt had cpx recently. He c/o weight gain which started at the same time of the physical. Was 162lbs 6 to 8 wks ago & c/o 14 lb wt gain. He takes 1/2 of a diuretic daily and wants to know if he can try 1 whole pill. No change in calorie intake or activity, feels that this is all fluid.  Initial call taken by: Charlsie Quest, [place],  November 10, 2009 1:58 PM  Follow-up for Phone Call        Chart reviewed:           04/06/09    04/10/09    2/9    3/4    7/11            176           173       166   163    172  Seems to have rgained to his baseline weight. Will not increase furosemide. Follow-up by: Neena Rhymes MD,  November 10, 2009 2:24 PM  Additional Follow-up for Phone Call Additional follow up Details #1::        Pt informed, he will continued to monitor and call office back for eval w/further concerns.  Additional Follow-up by: Charlsie Quest, CMA,  November 13, 2009 5:25 PM

## 2010-05-08 NOTE — Assessment & Plan Note (Signed)
Summary: FLU AND PNEMONIA/MEN/NWS  Nurse Visit   Vital Signs:  Patient Profile:   66 Years Old Male CC:      flu shot & pneumonia Height:     69 inches Temp:     97.7 degrees F oral  Vitals Entered By: Tomma Lightning (January 01, 2008 9:55 AM)                 Prior Medications: PLAVIX 75 MG  TABS (CLOPIDOGREL BISULFATE) Every 2-3 days SIMVASTATIN 40 MG  TABS (SIMVASTATIN) Every 2-3 days COLCHICINE 0.6 MG  TABS (COLCHICINE) two times a day COZAAR 50 MG  TABS (LOSARTAN POTASSIUM) Take 1 tablet by mouth once a day HYDROCHLOROTHIAZIDE 25 MG  TABS (HYDROCHLOROTHIAZIDE) 1/2 by mouth once daily CELEBREX 200 MG  CAPS (CELECOXIB) Take 1 tablet by mouth two times a day PROTONIX 40 MG  TBEC (PANTOPRAZOLE SODIUM) Take 1 tablet by mouth once a day AMBIEN CR 12.5 MG  TBCR (ZOLPIDEM TARTRATE) Take 1 tab by mouth at bedtime VITAMIN E 400 UNIT  CAPS (VITAMIN E) Take 1 tablet by mouth once a day OMEGA 3 FISH OIL 1000 () Take 1 tablet by mouth two times a day ASPIRIN EC LOW DOSE 81 MG  TBEC (ASPIRIN) 1 once daily GAS-X EXTRA STRENGTH 125 MG  CAPS (SIMETHICONE) as directed OXYCODONE HCL 5 MG  TABS (OXYCODONE HCL) 59m-15mg daily OXYCONTIN 10 MG  XR12H-TAB (OXYCODONE HCL) 2 tabs in AM and 1 tab in PM for chronic pain management. To be filled on or after 02/14/2008 ALPRAZOLAM XR 1 MG  XR24H-TAB (ALPRAZOLAM) 1 by mouth once daily OXYCODONE HCL 5 MG  TABS (OXYCODONE HCL) 1 by mouth q 4 as needed breakthrough pain. ALPRAZOLAM 0.5 MG TABS (ALPRAZOLAM) 1 by mouth once daily as needed. Current Allergies: ! PCN   Pneumovax Vaccine    Vaccine Type: Pneumovax    Site: right deltoid    Mfr: Merck    Dose: 0.5 ml    Route: IM    Given by: LTomma Lightning   Exp. Date: 10/08/2008    Lot #: [id number]   VIS given: 01/01/08   Orders Added: 1)  Admin 1st Vaccine [[18563]2)  Flu Vaccine 377yr+ [9[14970])  Pneumococcal Vaccine [9[26378])  Admin of Any Addtl Vaccine [9[58850]  Flu Vaccine Consent  Questions     Do you have a history of severe allergic reactions to this vaccine? no    Any prior history of allergic reactions to egg and/or gelatin? no    Do you have a sensitivity to the preservative Thimersol? no    Do you have a past history of Guillan-Barre Syndrome? no    Do you currently have an acute febrile illness? no    Have you ever had a severe reaction to latex? no    Vaccine information given and explained to patient? yes    Are you currently pregnant? no    Lot Number:[id number]   Site Given  Left Deltoid IMu

## 2010-05-08 NOTE — Progress Notes (Signed)
Summary: mri knee  Phone Note Call from Patient Call back at Home Phone [phone]   Caller: Patient Summary of Call: pt wanting MRI of right knee....  along with MRI L Spine Initial call taken by: Darnell Level,  October 31, 2008 9:35 AM  Follow-up for Phone Call        Sure - it is two seperate studies. [facility] notified right knee Follow-up by: Neena Rhymes MD,  October 31, 2008 9:39 AM  Additional Follow-up for Phone Call Additional follow up Details #1::        Patient notified/lmovm advising [facility]  will notify Additional Follow-up by: Estell Harpin CMA,  October 31, 2008 10:41 AM

## 2010-05-08 NOTE — Consult Note (Signed)
Summary: [facility], [facility], [place]   Imported By: Jerrye Noble D'jimraou 04/01/2007 11:22:21  _____________________________________________________________________  External Attachment:    Type:   Image     Comment:   External Document

## 2010-05-08 NOTE — Miscellaneous (Signed)
Summary: Orders Update  Clinical Lists Changes  Orders: Added new Test order of Venous Duplex Lower Extremity (Venous Duplex Lower) - Signed

## 2010-05-08 NOTE — Progress Notes (Signed)
Summary: RF AMBIEN  Phone Note Refill Request Message from:  Pharmacy  Refills Requested: Medication #1:  AMBIEN CR 12.5 MG  TBCR Take 1 tab by mouth at bedtime   Dosage confirmed as above?Dosage Confirmed Initial call taken by: Crissie Sickles, CMA,  December 13, 2008 11:15 AM  Follow-up for Phone Call        Med record reviewed: Ambien Cr 12.52m qhs Follow-up by: MNeena RhymesMD,  December 13, 2008 1:48 PM  Additional Follow-up for Phone Call Additional follow up Details #1::        ok to fill?  Additional Follow-up by: SCharlsie Quest CMA,  December 13, 2008 6:48 PM    Additional Follow-up for Phone Call Additional follow up Details #2::    Oops - yes refill #30 as needed refills Follow-up by: MNeena RhymesMD,  December 13, 2008 9:28 PM  Prescriptions: AMBIEN CR 12.5 MG  TBCR (ZOLPIDEM TARTRATE) Take 1 tab by mouth at bedtime  #30 x 5   Entered by:   SCharlsie Quest CMA   Authorized by:   MNeena RhymesMD   Signed by:   SCharlsie Quest CMA on 12/14/2008   Method used:   [facility]ph[facility] to ...       [phone] (retail)       [address]      [place]   [address]      Ph: [phone]      Fax: [phone]  RxID:   [id number]

## 2010-05-08 NOTE — Progress Notes (Signed)
Summary: Refill--Alprazolam  Phone Note Refill Request Message from:  Fax from Pharmacy on November 17, 2009 10:56 AM  Refills Requested: Medication #1:  ALPRAZOLAM XR 1 MG  XR24H-TAB 1 by mouth once daily Next Appointment Scheduled: none Initial call taken by: Ernestene Mention CMA,  November 17, 2009 10:56 AM  Follow-up for Phone Call        ok to refill x 5 Follow-up by: Neena Rhymes MD,  November 17, 2009 2:34 PM    Prescriptions: ALPRAZOLAM XR 1 MG  XR24H-TAB (ALPRAZOLAM) 1 by mouth once daily  #30 x 5   Entered by:   Ernestene Mention CMA   Authorized by:   Neena Rhymes MD   Signed by:   Ernestene Mention CMA on 11/17/2009   Method used:   Telephoned to ...       [facility] [phone]* (retail)       [address]       [place], [place]  [address]       Ph: [phone]       Fax: [phone]   RxID:   [id number]

## 2010-05-08 NOTE — Assessment & Plan Note (Signed)
Summary: leg pain / sd   Vital Signs:  Patient profile:   65 year old male Height:      65.5 inches (166.37 cm) Weight:      163.25 pounds (74.20 kg) O2 Sat:      93 % on Room air Temp:     97.1 degrees F (36.17 degrees C) oral Pulse rate:   95 / minute BP sitting:   119 / 70  (left arm) Cuff size:   regular  Vitals Entered By: Felipa Evener (June 09, 2009 4:36 PM)  O2 Flow:  Room air CC: L leg pain/numbness. pn scale 5/10/ [place]   Primary Care Provider:  Neena Rhymes MD  CC:  L leg pain/numbness. pn scale 5/10/ [place].  History of Present Illness: here after being seen earlier in the day per Dr Dyann Ruddle who rec'd urgent appt, and now here today at 445 PM, with c/o pain and swelling to the left calf;  has known hx of chronic LBP and recurrent LLE radicular symptoms, but this type of pain has been stable per pt wtihout increase in constancy, severity, or assoc  with increased LLE numbness or weakness;  he does drive and ride in car for work on a regular basis, sometimes as passenger, each time however with swelling and discomfort that recurs to the LLE usually of similar severity and improved with elevation while sleeping recumbant overnight;  the last 3 days however the overall swelling and discomfort has been of increased severity, with pain and tenderness to the left calf worse the usual, now mod to severe, worst to take first steps in the am or after sitting for some time, but still with improvement later in the day.  In fact , at the time of this office visit, the swelling and discomfort has been improved since seen per rheum (as per usual) but with some unsual tenderness still persisiting;  denies knee pain and swelling, or increased LBP or radicular symptoms.  No falls, trauma, fever, erythema, chills or other joint swelling such as ankle.  Pt denies CP, sob, doe, wheezing, orthopnea, pnd, palps, dizziness or syncope   Pt denies other new neuro symptoms such as headache, facial or  extremity weakness   Problems Prior to Update: 1)  Venous Insufficiency, Legs  (ICD-459.81) 2)  Back Pain, Lumbar, With Radiculopathy  (ICD-724.4) 3)  Degenerative Disc Disease, Lumbosacral Spine  (ICD-722.52) 4)  Rheumatoid Arthritis  (ICD-714.0) 5)  Anxiety  (ICD-300.00) 6)  Pain in Soft Tissues of Limb  (ICD-729.5) 7)  Generalized Osteoarthrosis Unspecified Site  (ICD-715.00) 8)  Pseudogout  (ICD-275.49) 9)  Hemochromatosis, Hx of  (ICD-V12.3) 10)  Dyslipidemia  (ICD-272.4) 11)  Cad  (ICD-414.00) 12)  Hypertension  (ICD-401.9) 13)  Diverticulitis, Hx of  (ICD-V12.79) 14)  Colonic Polyps, Hx of  (ICD-V12.72) 15)  Allergic Rhinitis  (ICD-477.9)  Medications Prior to Update: 1)  Plavix 75 Mg  Tabs (Clopidogrel Bisulfate) .... Every 2-3 Days 2)  Simvastatin 40 Mg  Tabs (Simvastatin) .... Every 2-3 Days 3)  Colchicine 0.6 Mg  Tabs (Colchicine) .... Two Times A Day 4)  Cozaar 50 Mg  Tabs (Losartan Potassium) .... Take 1 Tablet By Mouth Once A Day 5)  Hydrochlorothiazide 25 Mg  Tabs (Hydrochlorothiazide) .... 1/2 By Mouth Once Daily 6)  Celebrex 200 Mg  Caps (Celecoxib) .... Take 1 Tablet By Mouth Two Times A Day 7)  Protonix 40 Mg  Tbec (Pantoprazole Sodium) .... Take 1 Tablet By Mouth Once  A Day 8)  Ambien Cr 12.5 Mg  Tbcr (Zolpidem Tartrate) .... Take 1 Tab By Mouth At Bedtime 9)  Vitamin E 400 Unit  Caps (Vitamin E) .... Take 1 Tablet By Mouth Once A Day 10)  Omega 3 Fish Oil 1000 .... Take 1 Tablet By Mouth Two Times A Day 11)  Aspirin Ec Low Dose 81 Mg  Tbec (Aspirin) .[name].. 1 Once Daily 12)  Gas-X Extra Strength 125 Mg  Caps (Simethicone) .... As Directed 13)  Oxycontin 20 Mg Xr12h-Tab (Oxycodone Hcl) .[name].. 1 Three Times A Day Fill On or After 05/09/09 14)  Alprazolam Xr 1 Mg  Xr24h-Tab (Alprazolam) .[name].. 1 By Mouth Once Daily 15)  Oxycodone-Acetaminophen 5-325 Mg Tabs (Oxycodone-Acetaminophen) .[name].. 1 Q 6 Hours As Needed Breakthru Pain Fill On or After 05/09/2009 16)  Alprazolam 0.5  Mg Tabs (Alprazolam) .[name].. 1 By Mouth Once Daily As Needed. 17)  Leflunomide 10 Mg Tabs (Leflunomide) .[name].. 1 By Mouth Daily 18)  Prednisone 5 Mg Tabs (Prednisone) .[name].. 1 By Mouth Daily 19)  Multivitamins   Tabs (Multiple Vitamin) .[name].. 1 By Mouth Daily 20)  Diazepam 5 Mg/ml Soln (Diazepam) .... Every 6 Hours  Current Medications (verified): 1)  Plavix 75 Mg  Tabs (Clopidogrel Bisulfate) .... Every 2-3 Days 2)  Simvastatin 40 Mg  Tabs (Simvastatin) .... Every 2-3 Days 3)  Colchicine 0.6 Mg  Tabs (Colchicine) .... Two Times A Day 4)  Cozaar 50 Mg  Tabs (Losartan Potassium) .... Take 1 Tablet By Mouth Once A Day 5)  Hydrochlorothiazide 25 Mg  Tabs (Hydrochlorothiazide) .... 1/2 By Mouth Once Daily 6)  Celebrex 200 Mg  Caps (Celecoxib) .... Take 1 Tablet By Mouth Two Times A Day 7)  Protonix 40 Mg  Tbec (Pantoprazole Sodium) .... Take 1 Tablet By Mouth Once A Day 8)  Ambien Cr 12.5 Mg  Tbcr (Zolpidem Tartrate) .... Take 1 Tab By Mouth At Bedtime 9)  Vitamin E 400 Unit  Caps (Vitamin E) .... Take 1 Tablet By Mouth Once A Day 10)  Omega 3 Fish Oil 1000 .... Take 1 Tablet By Mouth Two Times A Day 11)  Aspirin Ec Low Dose 81 Mg  Tbec (Aspirin) .[name].. 1 Once Daily 12)  Gas-X Extra Strength 125 Mg  Caps (Simethicone) .... As Directed 13)  Oxycontin 10 Mg Xr12h-Tab (Oxycodone Hcl) .[name].. 1 By Mouth Three Times A Day 14)  Alprazolam Xr 1 Mg  Xr24h-Tab (Alprazolam) .[name].. 1 By Mouth Once Daily 15)  Oxycodone-Acetaminophen 5-325 Mg Tabs (Oxycodone-Acetaminophen) .[name].. 1 Q 6 Hours As Needed Breakthru Pain - To Fill Jun 09, 2009 16)  Alprazolam 0.5 Mg Tabs (Alprazolam) .[name].. 1 By Mouth Once Daily As Needed. 17)  Leflunomide 10 Mg Tabs (Leflunomide) .[name].. 1 By Mouth Daily 18)  Prednisone 5 Mg Tabs (Prednisone) .[name].. 1 By Mouth Daily 19)  Multivitamins   Tabs (Multiple Vitamin) .[name].. 1 By Mouth Daily 20)  Diazepam 5 Mg/ml Soln (Diazepam) .... Every 6 Hours  Allergies (verified): 1)  ! Pcn  Past History:  Past  Medical History: Last updated: 02/14/2009 UCD Allergic rhinitis Colonic polyps, hx of Diverticulitis, hx of Hypertension Osteoarthritis Hemochromatosis: dx'd 14 years ago last ferritin Aug 11,'08 52 (22-322), Fe 136 CAD-(Minimal coronary plaque in the LAD and right coronary system.  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 July 2006.  Last nuclear stress Aug 11.'08 fixed anterior/inferior defect, no inducible ischemia, EF 81%)      .  Physician Roster:         [place] surgeon -Sypher         GS- Mat Hassell Done         ortho- Bednarz/ankle, Dean-shoulder         Card - [facility]-Dr. 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 Very busy life- some stress. New relationship - looks serious (Sept '09)  Risk Factors: Alcohol Use: 2 (03/30/2007) Caffeine Use: 1 (03/30/2007) Exercise: yes (03/30/2007)  Risk Factors: Smoking Status: never (03/30/2007)  Review of Systems       all otherwise negative per pt   Physical Exam  General:  alert and well-developed.   Head:  normocephalic and atraumatic.   Eyes:  vision grossly intact, pupils equal, and pupils round.   Ears:  R ear normal  and L ear normal.   Nose:  no external deformity and no nasal discharge.   Mouth:  no gingival abnormalities and pharynx pink and moist.   Neck:  supple and no masses.   Lungs:  normal respiratory effort and normal breath sounds.   Heart:  normal rate and regular rhythm.   Msk:  spine nontender throughout Pulses:  1+ dorsalis pedis bilat Extremities:  knee with some bony enlargement but FROM, NT and no effusion;  some mild warmth noted and cant r/o mild superfic phlebitis immed just below the knee posteriorly only;  left calf mild tender with ? pos Homans;  trace to 1+ edema noted to knee but without erythema Neurologic:  cranial nerves II-XII intact and strength normal in all extremities., sensation and DTR's not done   Impression & Recommendations:  Problem # 1:  PAIN IN SOFT TISSUES OF LIMB (ICD-729.5)  left calf - neg for dvt dec 2010, but with some suspicion of recent worsening (though hx vague and exam essentially stable);  refilled the pain med;   I asked him to go to ER now for eval as it is now 5 PM on friday but he is adamant he will not and does not want lovenox;  will cont the plavix, add ASA 81 mg daily , and schedule dopplers ASAP (most likely Mon mar 7);  I also advised any worsening of the leg, or any s/s of CP/SOB/Palpitations, dizziness, syncope, or other new such as fever   - should go to ER immediately  Orders: Misc. Referral (Misc. Ref)  Problem # 2:  HYPERTENSION (ICD-401.9)  His updated medication list for this problem includes:    Cozaar 50 Mg Tabs (Losartan potassium) ..[name].. Take 1 tablet by mouth once a day    Hydrochlorothiazide 25 Mg Tabs (Hydrochlorothiazide) ..[name].. 1/2 by mouth once daily  BP today: 119/70 Prior BP: 142/96 (05/17/2009)  Labs Reviewed: K+: 4.1 (10/11/2008) Creat: :  0.7 (10/11/2008)   Chol: 144 (10/11/2008)   HDL: 49.00 (10/11/2008)   LDL: 89 (10/11/2008)   TG: 28.0 (10/11/2008) stable overall by hx and exam, ok to continue meds/tx as is    Complete Medication List: 1)  Plavix 75 Mg Tabs (Clopidogrel bisulfate) .... Every 2-3 days 2)  Simvastatin 40 Mg Tabs (Simvastatin) .... Every 2-3 days 3)  Colchicine 0.6 Mg Tabs (Colchicine) .... Two times a day 4)  Cozaar 50 Mg Tabs (Losartan potassium) .... Take 1 tablet by mouth once a day 5)  Hydrochlorothiazide 25 Mg Tabs (Hydrochlorothiazide) .... 1/2 by mouth once daily 6)  Celebrex 200 Mg Caps (Celecoxib) .... Take 1 tablet by mouth two times a day 7)  Protonix 40 Mg Tbec (Pantoprazole sodium) .... Take 1 tablet by mouth once a day 8)  Ambien Cr 12.5 Mg Tbcr (Zolpidem tartrate) .... Take 1 tab by mouth at bedtime 9)  Vitamin E 400 Unit Caps (Vitamin e) .... Take 1 tablet by mouth once a day 10)  Omega 3 Fish Oil 1000  .... Take 1 tablet by mouth two times a day 11)  Aspirin Ec Low Dose 81 Mg Tbec (Aspirin) .[name].. 1 once daily 12)  Gas-x Extra Strength 125 Mg Caps (Simethicone) .... As directed 13)  Oxycontin 10 Mg Xr12h-tab (Oxycodone hcl) .[name].. 1 by mouth three times a day 14)  Alprazolam Xr 1 Mg Xr24h-tab (Alprazolam) .[name].. 1 by mouth once daily 15)  Oxycodone-acetaminophen 5-325 Mg Tabs (Oxycodone-acetaminophen) .[name].. 1 q 6 hours as needed breakthru pain - to fill Jun 09, 2009 16)  Alprazolam 0.5 Mg Tabs (Alprazolam) .[name].. 1 by mouth once daily as needed. 17)  Leflunomide 10 Mg Tabs (Leflunomide) .[name].. 1 by mouth daily 18)  Prednisone 5 Mg Tabs (Prednisone) .[name].. 1 by mouth daily 19)  Multivitamins Tabs (Multiple vitamin) .[name].. 1 by mouth daily 20)  Diazepam 5 Mg/ml Soln (Diazepam) .... Every 6 hours  Patient Instructions: 1)  You will be contacted about the referral(s) to: venous doppler u/s for Monday 2)  please take the Aspirin 81 mg daily until the doppler test 3)  Continue all previous medications as before this visit  4)  Please schedule an appointment with your primary doctor as recommended at your last office visit with Dr Linda Hedges Prescriptions: OXYCODONE-ACETAMINOPHEN 5-325  MG TABS (OXYCODONE-ACETAMINOPHEN) 1 q 6 hours as needed breakthru pain - to fill Jun 09, 2009  #90 x 0   Entered and Authorized by:   Biagio Borg MD   Signed by:   Biagio Borg MD on 06/09/2009   Method used:   Print then Give to Patient   RxID:   [id number]

## 2010-05-08 NOTE — Progress Notes (Signed)
  Phone Note Refill Request Message from:  Fax from Pharmacy on January 12, 2010 8:27 AM  Refills Requested: Medication #1:  AMBIEN CR 12.5 MG  TBCR Take 1 tab by mouth at bedtime   Last Refilled: [facility] [address] Please Advise refills  Initial call taken by: [place],  January 12, 2010 8:28 AM  Follow-up for Phone Call        ok as needed  Follow-up by: Neena Rhymes MD,  January 12, 2010 8:36 AM    Prescriptions: AMBIEN CR 12.5 MG  TBCR (ZOLPIDEM TARTRATE) Take 1 tab by mouth at bedtime  #30 x 1   Entered by:   Ami Bullins CMA   Authorized by:   Neena Rhymes MD   Signed by:   Charlynne Cousins CMA on 01/12/2010   Method used:   Telephoned to ...       [facility] [id number][facility] (retail)       [address]       [place], [place]  [address]       Ph: [phone]       Fax: [phone]   RxID:   [id number]

## 2010-05-08 NOTE — Progress Notes (Signed)
Summary: PA celebrex   Phone Note From Pharmacy   [facility] [phone]* Summary of Call: Received fax from pharmacy stating that PA is required for Celebrex 236m two times a day , must call [phone] Called [facility] spoke with ACaryl Pinawho will fax PA form case# [id number].Marland KitchenMarland KitchenEllison HughsArchie CMA  February 14, 2010 2:33 PM   [phone] awaiting approval. MOphelia Charter February 14, 2010 4:27 PM   Celebrex Approved 01/24/10-02/14/11, pt aware. Initial call taken by: MOphelia Charter  February 16, 2010 1:54 PM

## 2010-05-08 NOTE — Letter (Signed)
[place], [place]  [address] Phone: [phone]      December 17, 2007   [name] [address] [place], [place] [address]  RE:  LAB RESULTS  Dear  [name],  The following is an interpretation of your most recent lab tests.  Please take note of any instructions provided or changes to medications that have resulted from your lab work.   Tests: (1) HIV 1,2 Antibody, with Reflex (50569)   HIV Antibody              NON REAC                    NON REAC  Tests: (2) RPR Reflex to T.pallidum Ab, IgG (23940)   RPR                       NON REAC                    NON REAC  Tests: (3) Herpes I and II, IgG with Reflex (79480) ! Herpes I and II, IgG [H]  27.9 IV        IV = Index Value          0.89 IV or less........Marland KitchenNegative-No significant level                                  of detectable HSV IgG antibody.          0.90-1.09 IV...........Marland KitchenEquivocal-Questionable presence                                  of IgG antibodies. Repeat testing                                  in 10-14 days may be helpful.          1.10 IV or greater....Marland KitchenMarland KitchenPositive-IgG antibody to HSV                                  detected which may indicate a                                  current or past HSV infection.              The best evidence for current infection is a significant        change on two appropriately timed specimens, where both        tests are done in the same laboratory at the same time.           The test HSV 2 Glycoprotein G Ab, IgG has been reflexed as     confirmation of the positive result obtained for Herpes I and II, IgG.  Tests: (4) Chlamydia Probe Amp, Urine (16553)  Chlamydia Probe Amp, Urine                             NEGATIVE  NEGATIVE   Tested positive for past Herpes simplex virus exposure. The remainder of the STD screens are negative.  Call or e-mail me if you have questions  ([email or web address]).   Sincerely Yours,    Neena Rhymes MD  Appended Document:  Mailed letter to pt per MD.

## 2010-05-08 NOTE — Medication Information (Signed)
Summary: Prior Authorization for Rx Ambien[facility] ([phone])  Prior Authorization for Rx Ambien[facility] ([facility] [phone])   Imported By: Jerrye Noble D'jimraou 05/09/2008 16:40:12  _____________________________________________________________________  External Attachment:    Type:   Image     Comment:   External Document

## 2010-05-08 NOTE — Progress Notes (Signed)
Summary: Alprazolam refill  Phone Note Refill Request   Refills Requested: Medication #1:  ALPRAZOLAM XR 1 MG  XR24H-TAB 1 by mouth once daily  Medication #2:  ALPRAZOLAM 0.5 MG TABS 1 by mouth once daily as needed. Initial call taken by: Ernestene Mention,  April 28, 2009 2:45 PM  Follow-up for Phone Call        Per previous note-patient still has refills on both. Left message on voicemail notifying patient that meds were rfx5 on 03-15-09. Refills are not due at this time. Follow-up by: Ernestene Mention,  April 28, 2009 2:47 PM  Additional Follow-up for Phone Call Additional follow up Details #1::        k Additional Follow-up by: Neena Rhymes MD,  May 01, 2009 8:28 AM

## 2010-05-08 NOTE — Progress Notes (Signed)
New/Updated Medications: OXYCONTIN 10 MG  XR12H-TAB (OXYCODONE HCL) 2 tabs in AM and 1 tab in PM for chronic pain management. To be filled on or after 10/13/2008 OXYCONTIN 10 MG  XR12H-TAB (OXYCODONE HCL) 2 tabs in AM and 1 tab in PM for chronic pain management. To be filled on or after 11/13/2008 OXYCONTIN 10 MG  XR12H-TAB (OXYCODONE HCL) 2 tabs in AM and 1 tab in PM for chronic pain management. To be filled on or after 12/14/2008 OXYCODONE HCL 5 MG  TABS (OXYCODONE HCL) 1 by mouth q 4 as needed breakthrough pain. Fill on or after 10/26/2008 OXYCODONE HCL 5 MG  TABS (OXYCODONE HCL) 1 by mouth q 4 as needed breakthrough pain. Fill on or after 11/26/2008 OXYCODONE HCL 5 MG  TABS (OXYCODONE HCL) 1 by mouth q 4 as needed breakthrough pain. Fill on or after 12/27/2008 Prescriptions: OXYCODONE HCL 5 MG  TABS (OXYCODONE HCL) 1 by mouth q 4 as needed breakthrough pain. Fill on or after 12/27/2008  #60 x 0   Entered by:   Dwana Curd, CMA   Authorized by:   Neena Rhymes MD   Signed by:   Dwana Curd, CMA on 10/17/2008   Method used:   Print then Give to Patient   RxID:   [id number] OXYCODONE HCL 5 MG  TABS (OXYCODONE HCL) 1 by mouth q 4 as needed breakthrough pain. Fill on or after 11/26/2008  #60 x 0   Entered by:   Dwana Curd, CMA   Authorized by:   Neena Rhymes MD   Signed by:   Dwana Curd, CMA on 10/17/2008   Method used:   Print then Give to Patient   RxID:   [id number] OXYCODONE HCL 5 MG  TABS (OXYCODONE HCL) 1 by mouth q 4 as needed breakthrough pain. Fill on or after 10/26/2008  #60 x 0   Entered by:   Dwana Curd, CMA   Authorized by:   Neena Rhymes MD   Signed by:   Dwana Curd, CMA on 10/17/2008   Method used:   Print then Give to Patient   RxID:   [id number] OXYCONTIN 10 MG  XR12H-TAB (OXYCODONE HCL) 2 tabs in AM and 1 tab in PM for chronic pain management. To be filled on or after 12/14/2008  #90 x 0   Entered  by:   Dwana Curd, CMA   Authorized by:   Neena Rhymes MD   Signed by:   Dwana Curd, CMA on 10/17/2008   Method used:   Print then Give to Patient   RxID:   [id number] OXYCONTIN 10 MG  XR12H-TAB (OXYCODONE HCL) 2 tabs in AM and 1 tab in PM for chronic pain management. To be filled on or after 11/13/2008  #90 x 0   Entered by:   Dwana Curd, CMA   Authorized by:   Neena Rhymes MD   Signed by:   Dwana Curd, CMA on 10/17/2008   Method used:   Print then Give to Patient   RxID:   [id number] OXYCONTIN 10 MG  XR12H-TAB (OXYCODONE HCL) 2 tabs in AM and 1 tab in PM for chronic pain management. To be filled on or after 10/13/2008  #90 x 0   Entered by:   Dwana Curd, CMA   Authorized by:   Neena Rhymes MD   Signed by:   Dwana Curd, CMA on 10/17/2008   Method used:   Print then  Give to Patient   RxID:   [id number]

## 2010-05-08 NOTE — Assessment & Plan Note (Signed)
Summary: CPX / $50 /[facility]   Vital Signs:  Patient profile:   65 year old male Height:      69 inches Weight:      168.50 pounds BMI:     24.97 O2 Sat:      92 % on Room air Temp:     97.3 degrees F oral Pulse rate:   67 / minute BP sitting:   140 / 60  (left arm) Cuff size:   regular  Vitals Entered By: Dwana Curd, CMA (October 17, 2008 1:27 PM)  O2 Flow:  Room air  Primary Care Provider:  Ezana Hubbert   History of Present Illness: Patient for routine arthritis. Sees Dr. Franco Nones for RA - he is sleeping more, Leflunomide was started about a year ago and he has seen no progression of disease. He has had right hip/groin pain that is acute. He had plain films which showed normal hip but significant DDD low back. He has had a h/o DDD and had ESI by Dr. Nelva Bush 10 years ago.   His chronic pain management secondary to multiple joint issues from hemachromatosis is stable. His ankle replacement has been a total success. Shoulder replacement great for 4 months but now with residual pain. He will check back with Dr. Noralee Stain in this regard.  He sees Hessie Diener yearly and had a normal exam and normal PSA.  Current Medications (verified): 1)  Plavix 75 Mg  Tabs (Clopidogrel Bisulfate) .... Every 2-3 Days 2)  Simvastatin 40 Mg  Tabs (Simvastatin) .... Every 2-3 Days 3)  Colchicine 0.6 Mg  Tabs (Colchicine) .... Two Times A Day 4)  Cozaar 50 Mg  Tabs (Losartan Potassium) .... Take 1 Tablet By Mouth Once A Day 5)  Hydrochlorothiazide 25 Mg  Tabs (Hydrochlorothiazide) .... 1/2 By Mouth Once Daily 6)  Celebrex 200 Mg  Caps (Celecoxib) .... Take 1 Tablet By Mouth Two Times A Day 7)  Protonix 40 Mg  Tbec (Pantoprazole Sodium) .... Take 1 Tablet By Mouth Once A Day 8)  Ambien Cr 12.5 Mg  Tbcr (Zolpidem Tartrate) .... Take 1 Tab By Mouth At Bedtime 9)  Vitamin E 400 Unit  Caps (Vitamin E) .... Take 1 Tablet By Mouth Once A Day 10)  Omega 3 Fish Oil 1000 .... Take 1 Tablet By Mouth Two Times A Day 11)   Aspirin Ec Low Dose 81 Mg  Tbec (Aspirin) .[name].. 1 Once Daily 12)  Gas-X Extra Strength 125 Mg  Caps (Simethicone) .... As Directed 13)  Oxycodone Hcl 5 Mg  Tabs (Oxycodone Hcl) .[name].. 5m-15mg Daily 14)  Oxycontin 10 Mg  Xr12h-Tab (Oxycodone Hcl) .... 2 Tabs in Am and 1 Tab in Pm For Chronic Pain Management. To Be Filled On or After 09/13/2008 15)  Alprazolam Xr 1 Mg  Xr24h-Tab (Alprazolam) ..[name]. 1 By Mouth Once Daily 16)  Oxycodone Hcl 5 Mg  Tabs (Oxycodone Hcl) ..[name]. 1 By Mouth Q 4 As Needed Breakthrough Pain. Fill On or After 07/23/08 17)  Alprazolam 0.5 Mg Tabs (Alprazolam) ..[name]. 1 By Mouth Once Daily As Needed. 18)  Luflidomide .... Take 1 Tablet By Mouth Once A Day  Allergies (verified): 1)  ! Pcn  Past History:  Past Medical History: Last updated: 03/30/2007 UCD Allergic rhinitis Colonic polyps, hx of Diverticulitis, hx of Hypertension Osteoarthritis hemochromatosis: dx'd 14 years ago last ferritin Aug 11,'08 52 (22-322), Fe 136 CAD-last nuclear stress Aug 11.'08 fixed anterior/inferior defect, no inducible ischemia, EF 81%      .  Physician Roster:         [facility] surgeon -Sypher         GS- Mat Martin         ortho- Bednarz/ankle, Dean-shoulder         Card - [facility]-Dr. 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 Foot surgery for removal of bone spurs right foot Nov '08 Reconstruction of Right ankle June '09 ([facility])  Family History: Last updated: 04-16-07 father - died 106; complications of CAD, prostate cancer, HTN mother - 36; cancer survivor uterine, ankle edema, macular degeneration 1 brother - high Lipids, schizophrenic,  dependent 1 sister - thyroid diseae Neg - colon cancer, DM, no MI.  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 very busy life- some stress. New relationship - looks serious (Sept '09)  Risk Factors: Alcohol Use: 2 (101-11-2007 Caffeine Use: 1 (101-08-09 Exercise: yes (101-08-09  Risk Factors: Smoking Status: never (12009/01/08  Review of Systems  The patient denies anorexia, fever, weight loss, weight gain, decreased hearing, chest pain, syncope, peripheral edema, headaches, hemoptysis, hematuria, muscle weakness, difficulty walking, unusual weight change, enlarged lymph nodes, and angioedema.    Physical Exam  General:  WNWD white male NAD Head:  Normocephalic and atraumatic without obvious abnormalities. No apparent alopecia or balding. Eyes:  No corneal or conjunctival inflammation noted. EOMI. Perrla. Funduscopic exam benign, without hemorrhages, exudates or papilledema. Vision grossly normal. Ears:  External ear exam shows no significant lesions or deformities.  Otoscopic examination reveals clear canals, tympanic membranes are intact bilaterally without bulging, retraction, inflammation or discharge. Hearing is grossly normal bilaterally. Nose:  no external deformity and no external erythema.   Mouth:  Oral mucosa and oropharynx without lesions or exudates.  Teeth in good repair. Neck:  No deformities, masses, or tenderness noted.no thyromegaly and no carotid bruits.   Chest Wall:  No deformities, masses, tenderness or gynecomastia noted. Lungs:  Normal respiratory effort, chest expands symmetrically. Lungs are clear to auscultation, no crackles or wheezes. Heart:  Normal rate and regular rhythm. S1 and S2 normal without gallop, murmur, click, rub or other extra sounds. Abdomen:  ventral  surgical scars. soft, non-tender, normal bowel sounds, no guarding, no rigidity, and no hepatomegaly.   Rectal:  deferred to GU Prostate:  deferred to GU Msk:  bilateral surgical scar shoulders. Englarged DIP and PIP joints both hands. Ankle not examined. Pulses:  2+ radial and DP pulses Extremities:  No clubbing, cyanosis, edema, or deformity noted with normal full range of motion of all joints.   Neurologic:  alert & oriented X3, cranial nerves II-XII intact, strength normal in all extremities, gait normal, and DTRs symmetrical and normal.   Skin:  turgor normal, color normal, no rashes, no ulcerations, and no edema.  Very tanned. Cervical Nodes:  No lymphadenopathy noted Axillary Nodes:  No palpable lymphadenopathy Psych:  Oriented X3, memory intact for recent and remote, normally interactive, good eye contact, and not anxious appearing.  Impression & Recommendations:  Problem # 1:  CORONARY ARTERY DISEASE (ICD-414.00) Doing well with no cardiac symptoms. He is adherent to his medical regimen. He is not due for any testing at this time.  Plan: continue present meds.   His updated medication list for this problem includes:    Plavix 75 Mg Tabs (Clopidogrel bisulfate) ..... Every 2-3 days    Cozaar 50 Mg Tabs (Losartan potassium) .[name]... Take 1 tablet by mouth once a day    Hydrochlorothiazide 25 Mg Tabs (Hydrochlorothiazide) .[name]... 1/2 by mouth once daily    Aspirin Ec Low Dose 81 Mg Tbec (Aspirin) .[name]... 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 He has had significant joint damage with subsequent replacement joints. He will see Dr. Noralee Stain in regard to recurrent shoulder pain. His ankle repair is holding up well. His pain is well controlled.   Plan: no change in regimen.          patient to arrange his own follow-up consults.   Problem # 3:  HYPERTENSION (ICD-401.9)  His updated medication list for this problem includes:    Cozaar 50 Mg Tabs  (Losartan potassium) .[name]... Take 1 tablet by mouth once a day    Hydrochlorothiazide 25 Mg Tabs (Hydrochlorothiazide) .[name]... 1/2 by mouth once daily  BP today: 140/60 Prior BP: 146/90 (01/08/2008)  Adequate control  Problem # 4:  DYSLIPIDEMIA (ICD-272.4) LDL 89 HDL 49  - not a goal of LDL less than 80 for patient with CAD/Stents.  Plan: continue present meds         improvement in dietary management         Repeat lab in 3 months - if LDL greater than 80 will increase simvastatin to 58m.   His updated medication list for this problem includes:    Simvastatin 40 Mg Tabs (Simvastatin) ..... Every 2-3 days  Labs Reviewed: SGOT: 15 (04/08/2007)   SGPT: 16 (04/08/2007)   HDL:44.0 (04/08/2007)  LDL:80 (04/08/2007)  Chol:132 (04/08/2007)  Trig:38 (04/08/2007)  Problem # 5:  RHEUMATOID ARTHRITIS (ICD-714.0) Patient diagnosed and has been on DMARDs for 1 year. He reports that he is doing well. Dr. SFranco Nonesis monitoring labs routinely.  Problem # 6:  Preventive Health Care (ICD-V70.0) Unremarkable exam. Current with urologist for prostate exam and PSA, with repeat PSA normal in our lab. Current with colorectal cancer screening..  In summary: a very nice man who is medically stable. He will return for lipid panel in 3 months.   Complete Medication List: 1)  Plavix 75 Mg Tabs (Clopidogrel bisulfate) .... Every 2-3 days 2)  Simvastatin 40 Mg Tabs (Simvastatin) .... Every 2-3 days 3)  Colchicine 0.6 Mg Tabs (Colchicine) .... Two times a day 4)  Cozaar 50 Mg Tabs (Losartan potassium) .... Take 1 tablet by mouth once a day 5)  Hydrochlorothiazide 25 Mg Tabs (Hydrochlorothiazide) .... 1/2 by mouth once daily 6)  Celebrex 200 Mg Caps (Celecoxib) .... Take 1 tablet by mouth two times a day 7)  Protonix 40 Mg Tbec (Pantoprazole sodium) .... Take 1 tablet by mouth once a day 8)  Ambien Cr 12.5 Mg Tbcr (Zolpidem tartrate) .... Take 1 tab by mouth at bedtime 9)  Vitamin E 400 Unit Caps (Vitamin e)  .... Take 1 tablet by mouth once a day 10)  Omega 3 Fish Oil 1000  .... Take 1 tablet by mouth two times a day 11)  Aspirin Ec Low Dose 81 Mg Tbec (  Aspirin) .[name].. 1 once daily 12)  Gas-x Extra Strength 125 Mg Caps (Simethicone) .... As directed 13)  Oxycodone Hcl 5 Mg Tabs (Oxycodone hcl) .[name].. 43m-15mg daily 14)  Oxycontin 10 Mg Xr12h-tab (Oxycodone hcl) .... 2 tabs in am and 1 tab in pm for chronic pain management. to be filled on or after 12/14/2008 15)  Alprazolam Xr 1 Mg Xr24h-tab (Alprazolam) ..[name]. 1 by mouth once daily 16)  Oxycodone Hcl 5 Mg Tabs (Oxycodone hcl) ..[name]. 1 by mouth q 4 as needed breakthrough pain. fill on or after 12/27/2008 17)  Alprazolam 0.5 Mg Tabs (Alprazolam) ..[name]. 1 by mouth once daily as needed. 18)  Luflidomide  .... Take 1 tablet by mouth once a day    Tests: (1) BMP (METABOL)   Sodium                    138 mEq/L                   135-145   Potassium                 4.1 mEq/L                   3.5-5.1   Chloride                  97 mEq/L                    96-112   Carbon Dioxide       [H]  33 mEq/L                    19-32   Glucose              [H]  112 mg/dL                   70-99   BUN                       12 mg/dL                    6-23   Creatinine                0.7 mg/dL                   0.4-1.5   Calcium                   8.9 mg/dL                   8.4-10.5   GFR                       121.67 mL/min               >60  Tests: (2) Lipid Panel (LIPID)   Cholesterol               144 mg/dL                   0-200     ATP III Classification            Desirable:  < 200 mg/dL                    Borderline High:  200 - 239 mg/dL  High:  > = 240 mg/dL   Triglycerides             28.0 mg/dL                  0.0-149.0     Normal:  <150 mg/dL     Borderline High:  150 - 199 mg/dL   HDL                       49.00 mg/dL                 >39.00   VLDL Cholesterol          5.6 mg/dL                   0.0-40.0   LDL Cholesterol           89  mg/dL                    0-99  CHO/HDL Ratio:  CHD Risk                             3                    Men          Women     1/2 Average Risk     3.4          3.3     Average Risk          5.0          4.4     2X Average Risk          9.6          7.1     3X Average Risk          15.0          11.0                           Tests: (3) CBC Platelet w/Diff (CBCD)   White Cell Count     [L]  4.0 K/uL                    4.5-10.5   Red Cell Count            4.64 Mil/uL                 4.22-5.81   Hemoglobin                15.0 g/dL                   13.0-17.0   Hematocrit                43.2 %                      39.0-52.0   MCV                       93.2 fl                     78.0-100.0   MCHC                      34.6 g/dL  30.0-36.0   RDW                       12.7 %                      11.5-14.6   Platelet Count            206.0 K/uL                  150.0-400.0   Neutrophil %              66.9 %                      43.0-77.0   Lymphocyte %              20.2 %                      12.0-46.0   Monocyte %                10.3 %                      3.0-12.0   Eosinophils%              2.4 %                       0.0-5.0   Basophils %               0.2 %                       0.0-3.0   Neutrophill Absolute      2.7 K/uL                    1.4-7.7   Lymphocyte Absolute       0.8 K/uL                    0.7-4.0   Monocyte Absolute         0.4 K/uL                    0.1-1.0  Eosinophils, Absolute                             0.1 K/uL                    0.0-0.7   Basophils Absolute        0.0 K/uL                    0.0-0.1  Tests: (4) Hepatic/Liver Function Panel (HEPATIC)   Total Bilirubin           1.0 mg/dL                   0.3-1.2   Direct Bilirubin          0.2 mg/dL                   0.0-0.3   Alkaline Phosphatase      76 U/L                      39-117   AST  17 U/L                      0-37   ALT                       14 U/L                       0-53   Total Protein             6.9 g/dL                    6.0-8.3   Albumin                   4.1 g/dL                    3.5-5.2  Tests: (5) TSH (TSH)   FastTSH                   0.54 uIU/mL                 0.35-5.50  Tests: (6) UDip Only (UDIP)   Color                     LT. YELLOW       RANGE:  Yellow;Lt. Yellow   Clarity                   CLEAR                       Clear   Specific Gravity          <=1.005                     1.000 - 1.030   Urine Ph                  6.5                         5.0-8.0   Protein                   NEGATIVE                    Negative   Urine Glucose             NEGATIVE                    Negative   Ketones                   NEGATIVE                    Negative   Urine Bilirubin           NEGATIVE                    Negative   Blood                     NEGATIVE                    Negative   Urobilinogen              0.2  0.0 - 1.0   Leukocyte Esterace        NEGATIVE                    Negative   Nitrite                   NEGATIVE                    Negative  Tests: (7) Prostate Specific Antigen (PSA)   PSA-Hyb                   1.64 ng/mL                  0.10-4.00Prescriptions: AMBIEN CR 12.5 MG  TBCR (ZOLPIDEM TARTRATE) Take 1 tab by mouth at bedtime  #30 x 12   Entered and Authorized by:   Neena Rhymes MD   Signed by:   Neena Rhymes MD on 10/17/2008   Method used:   Print then Give to Patient   RxID:   [id number] PROTONIX 40 MG  TBEC (PANTOPRAZOLE SODIUM) Take 1 tablet by mouth once a day  #30 Tablet x 10   Entered and Authorized by:   Neena Rhymes MD   Signed by:   Neena Rhymes MD on 10/17/2008   Method used:   Print then Give to Patient   RxID:   [id number] HYDROCHLOROTHIAZIDE 25 MG  TABS (HYDROCHLOROTHIAZIDE) 1/2 by mouth once daily  #100 x 3   Entered and Authorized by:   Neena Rhymes MD   Signed by:   Neena Rhymes MD on 10/17/2008   Method used:   Print then  Give to Patient   RxID:   [id number] COZAAR 50 MG  TABS (LOSARTAN POTASSIUM) Take 1 tablet by mouth once a day  #90 x 3   Entered and Authorized by:   Neena Rhymes MD   Signed by:   Neena Rhymes MD on 10/17/2008   Method used:   Print then Give to Patient   RxID:   [id number] COLCHICINE 0.6 MG  TABS (COLCHICINE) two times a day  #60 Tablet x 12   Entered and Authorized by:   Neena Rhymes MD   Signed by:   Neena Rhymes MD on 10/17/2008   Method used:   Print then Give to Patient   RxID:   [id number] SIMVASTATIN 40 MG  TABS (SIMVASTATIN) Every 2-3 days  #90 x 1   Entered and Authorized by:   Neena Rhymes MD   Signed by:   Neena Rhymes MD on 10/17/2008   Method used:   Print then Give to Patient   RxID:   [id number] PLAVIX 17 MG  TABS (CLOPIDOGREL BISULFATE) Every 2-3 days  #30 x 5   Entered and Authorized by:   Neena Rhymes MD   Signed by:   Neena Rhymes MD on 10/17/2008   Method used:   Print then Give to Patient   RxID:   [id number] ALPRAZOLAM 0.5 MG TABS (ALPRAZOLAM) 1 by mouth once daily as needed.  #90 x 3   Entered and Authorized by:   Neena Rhymes MD   Signed by:   Neena Rhymes MD on 10/17/2008   Method used:   Print then Give to Patient   RxID:   [id number]

## 2010-05-08 NOTE — Progress Notes (Signed)
Summary: Medication Refill  Phone Note Call from Patient Call back at Home Phone [phone]   Caller: Patient Call For: Neena Rhymes MD Summary of Call: patient called requesting a rx for Oxycontin. He states he is taking oxycontin three times a day  and would like to know if he could have 12 pills to get him through the weekend and until his next office visit.  Patient informed Pain medication  could not be prescribed by anyone other than the precribing doctor office. Patient has requested . Patient has appointment scheduled for 12/15/07 Patient has requested to see if another doctor would provide him a refill until his office visit Initial call taken by: Jiles Garter CMA,  December 11, 2007 11:50 AM  Follow-up for Phone Call        i rx'ed #12 Follow-up by: Donavan Foil MD,  December 11, 2007 12:35 PM      Prescriptions: OXYCONTIN 10 MG  XR12H-TAB (OXYCODONE HCL) 2 tabs in AM and 1 tab in PM for chronic pain management.  #12 x 0   Entered by:   Donavan Foil MD   Authorized by:   Neena Rhymes MD   Signed by:   Donavan Foil MD on 12/11/2007   Method used:   Print then Give to Patient   RxID:   [id number]   Appended Document: Medication Refill Dr. Linda Hedges aware, same picked up by pt now.

## 2010-05-08 NOTE — Letter (Signed)
Summary: [facility]   Imported By: Phillis Knack 07/20/2008 13:06:30  _____________________________________________________________________  External Attachment:    Type:   Image     Comment:   External Document

## 2010-05-10 NOTE — Assessment & Plan Note (Addendum)
Summary: FOLLOW UP POST SURG-LB   Vital Signs:  Patient profile:   65 year old male Height:      65.5 inches Weight:      168 pounds BMI:     27.63 O2 Sat:      94 % on Room air Temp:     97.8 degrees F oral Pulse rate:   103 / minute BP sitting:   158 / 86  (left arm) Cuff size:   regular  Vitals Entered By: Charlynne Cousins CMA (April 30, 2010 1:09 PM)  O2 Flow:  Room air CC: follow-up visit, pt states he is no longer taking Vit E/ ab   Primary Care Provider:  Neena Rhymes MD  CC:  follow-up visit and pt states he is no longer taking Vit E/ ab.  History of Present Illness: [name] is followed for chronic pain syndrome from a variety of causes. He recently picked up his routine three months prescriptions of controlled meds. I had wanted him to be seen in March or April, but he was scheduled for today. There is no change in his condition. Apologies made....no chage for visit.   Current Medications (verified): 1)  Oxycontin 10 Mg Xr12h-Tab (Oxycodone Hcl) .[name].. 1 By Mouth Three Times A Day Fill On or After 06/18/2010 2)  Oxycodone-Acetaminophen 5-325 Mg Tabs (Oxycodone-Acetaminophen) .[name].. 1 Q 6 Hours As Needed Breakthru Pain  Fill On or After 06/18/2010 3)  Alprazolam Xr 1 Mg  Xr24h-Tab (Alprazolam) .[name].. 1 By Mouth Once Daily 4)  Alprazolam 0.5 Mg Tabs (Alprazolam) .[name].. 1 By Mouth Once Daily As Needed. 5)  Plavix 75 Mg  Tabs (Clopidogrel Bisulfate) .... Every 2-3 Days 6)  Simvastatin 40 Mg  Tabs (Simvastatin) .... Every 2-3 Days 7)  Colchicine 0.6 Mg  Tabs (Colchicine) .... Two Times A Day 8)  Cozaar 50 Mg  Tabs (Losartan Potassium) .... Take 1 Tablet By Mouth Once A Day 9)  Hydrochlorothiazide 25 Mg  Tabs (Hydrochlorothiazide) .... 1/2 By Mouth Once Daily 10)  Celebrex 200 Mg  Caps (Celecoxib) .... Take 1 Tablet By Mouth Two Times A Day 11)  Protonix 40 Mg  Tbec (Pantoprazole Sodium) .... Take 1 Tablet By Mouth Once A Day 12)  Ambien Cr 12.5 Mg  Tbcr (Zolpidem Tartrate)  .... Take 1 Tab By Mouth At Bedtime 13)  Vitamin E 400 Unit  Caps (Vitamin E) .... Take 1 Tablet By Mouth Once A Day 14)  Omega 3 Fish Oil 1000 .... Take 1 Tablet By Mouth Two Times A Day 15)  Aspirin Ec Low Dose 81 Mg  Tbec (Aspirin) .[name].. 1 Once Daily 16)  Gas-X Extra Strength 125 Mg  Caps (Simethicone) .... As Directed 17)  Leflunomide 10 Mg Tabs (Leflunomide) .[name].. 1 By Mouth Daily 18)  Prednisone 5 Mg Tabs (Prednisone) .[name].. 1 By Mouth Daily 19)  Multivitamins   Tabs (Multiple Vitamin) .[name].. 1 By Mouth Daily 20)  Diazepam 5 Mg/ml Soln (Diazepam) .... Every 6 Hours 21)  Paxil Cr 12.5 Mg Xr24h-Tab (Paroxetine Hcl) .[name].. 1 By Mouth Once Daily 22)  Foradil Aerolizer 12 Mcg Caps (Formoterol Fumarate) .[name].. 1 Two Times A Day  Allergies (verified): 1)  ! Pcn   Complete Medication List: 1)  Oxycontin 10 Mg Xr12h-tab (Oxycodone hcl) .[name].. 1 by mouth three times a day fill on or after 06/18/2010 2)  Oxycodone-acetaminophen 5-325 Mg Tabs (Oxycodone-acetaminophen) .[name].. 1 q 6 hours as needed breakthru pain  fill on or after 06/18/2010 3)  Alprazolam Xr 1 Mg Xr24h-tab (Alprazolam) .[name].. 1 by mouth once daily 4)  Alprazolam 0.5 Mg Tabs (Alprazolam) .[name].. 1 by mouth once daily as needed. 5)  Plavix 75 Mg Tabs (Clopidogrel bisulfate) .... Every 2-3 days 6)  Simvastatin 40 Mg Tabs (Simvastatin) .... Every 2-3 days 7)  Colchicine 0.6 Mg Tabs (Colchicine) .... Two times a day 8)  Cozaar 50 Mg Tabs (Losartan potassium) .... Take 1 tablet by mouth once a day 9)  Hydrochlorothiazide 25 Mg Tabs (Hydrochlorothiazide) .... 1/2 by mouth once daily 10)  Celebrex 200 Mg Caps (Celecoxib) .... Take 1 tablet by mouth two times a day 11)  Protonix 40 Mg Tbec (Pantoprazole sodium) .... Take 1 tablet by mouth once a day 12)  Ambien Cr 12.5 Mg Tbcr (Zolpidem tartrate) .... Take 1 tab by mouth at bedtime 13)  Vitamin E 400 Unit Caps (Vitamin e) .... Take 1 tablet by mouth once a day 14)  Omega 3 Fish Oil 1000  .... Take 1  tablet by mouth two times a day 15)  Aspirin Ec Low Dose 81 Mg Tbec (Aspirin) .[name].. 1 once daily 16)  Gas-x Extra Strength 125 Mg Caps (Simethicone) .... As directed 17)  Leflunomide 10 Mg Tabs (Leflunomide) .[name].. 1 by mouth daily 18)  Prednisone 5 Mg Tabs (Prednisone) .[name].. 1 by mouth daily 19)  Multivitamins Tabs (Multiple vitamin) .[name].. 1 by mouth daily 20)  Diazepam 5 Mg/ml Soln (Diazepam) .... Every 6 hours 21)  Paxil Cr 12.5 Mg Xr24h-tab (Paroxetine hcl) .[name].. 1 by mouth once daily 22)  Foradil Aerolizer 12 Mcg Caps (Formoterol fumarate) .[name].. 1 two times a day  Other Orders: No Charge Patient Arrived (NCPA0) (NCPA0)   Orders Added: 1)  No Charge Patient Arrived (NCPA0) [NCPA0]

## 2010-05-10 NOTE — Progress Notes (Signed)
  Phone Note Refill Request Message from:  Fax from Pharmacy on May 04, 2010 8:42 AM  Refills Requested: Medication #1:  AMBIEN CR 12.5 MG  TBCR Take 1 tab by mouth at bedtime   Last Refilled: 03/21/2010 fax from [facility], please Advise refills  Initial call taken by: [place],  May 04, 2010 8:43 AM    Prescriptions: AMBIEN CR 12.5 MG  TBCR (ZOLPIDEM TARTRATE) Take 1 tab by mouth at bedtime  #30 x 6   Entered and Authorized by:   Neena Rhymes MD   Signed by:   Neena Rhymes MD on 05/04/2010   Method used:   Telephoned to ...       [facility] [phone]* (retail)       [address]       [place], [place]  [address]       Ph: [phone]       Fax: [phone]   RxID:   [id number]

## 2010-05-10 NOTE — Letter (Signed)
Summary: [facility]   Imported By: Phillis Knack 03/22/2010 09:53:28  _____________________________________________________________________  External Attachment:    Type:   Image     Comment:   External Document

## 2010-05-10 NOTE — Progress Notes (Signed)
Summary: RFs  Phone Note Refill Request Call back at [phone]   Refills Requested: Medication #1:  OXYCODONE-ACETAMINOPHEN 5-325 MG TABS 1 q 6 hours as needed breakthru pain  fill on or after 03/19/2010  Medication #2:  OXYCONTIN 10 MG XR12H-TAB 1 by mouth three times a day fill on or after 03/19/2010  Medication #3:  ALPRAZOLAM XR 1 MG  XR24H-TAB 1 by mouth once daily  Medication #4:  ALPRAZOLAM 0.5 MG TABS 1 by mouth once daily as needed. When is pt due for next office visit? Last office visit was 01/2010  Initial call taken by: Charlsie Quest, [place],  April 18, 2010 4:18 PM  Follow-up for Phone Call        [facility] to provide refills - 30 day supply with 3 rxs. Needs f/u OV March or April Follow-up by: Neena Rhymes MD,  April 18, 2010 4:43 PM  Additional Follow-up for Phone Call Additional follow up Details #1::        Pt informed, Rx in cabinet for pt pick up Additional Follow-up by: Crissie Sickles, [place],  April 20, 2010 9:47 AM    New/Updated Medications: OXYCONTIN 10 MG XR12H-TAB (OXYCODONE HCL) 1 by mouth three times a day fill on or after 04/19/2010 OXYCONTIN 10 MG XR12H-TAB (OXYCODONE HCL) 1 by mouth three times a day fill on or after 06/18/2010 OXYCONTIN 10 MG XR12H-TAB (OXYCODONE HCL) 1 by mouth three times a day fill on or after 04/19/2010 OXYCONTIN 10 MG XR12H-TAB (OXYCODONE HCL) 1 by mouth three times a day fill on or after 05/20/2010 OXYCODONE-ACETAMINOPHEN 5-325 MG TABS (OXYCODONE-ACETAMINOPHEN) 1 q 6 hours as needed breakthru pain  fill on or after 04/19/2010 OXYCODONE-ACETAMINOPHEN 5-325 MG TABS (OXYCODONE-ACETAMINOPHEN) 1 q 6 hours as needed breakthru pain  fill on or after 05/20/2010 OXYCODONE-ACETAMINOPHEN 5-325 MG TABS (OXYCODONE-ACETAMINOPHEN) 1 q 6 hours as needed breakthru pain  fill on or after 02/18/2011 OXYCODONE-ACETAMINOPHEN 5-325 MG TABS (OXYCODONE-ACETAMINOPHEN) 1 q 6 hours as needed breakthru pain  fill on or after  06/18/2010 Prescriptions: OXYCODONE-ACETAMINOPHEN 5-325 MG TABS (OXYCODONE-ACETAMINOPHEN) 1 q 6 hours as needed breakthru pain  fill on or after 06/18/2010  #90 x 0   Entered by:   Charlsie Quest, CMA   Authorized by:   Neena Rhymes MD   Signed by:   Charlsie Quest, CMA on 04/19/2010   Method used:   Print then Give to Patient   RxID:   [id number] OXYCONTIN 10 MG XR12H-TAB (OXYCODONE HCL) 1 by mouth three times a day fill on or after 06/18/2010  #90 x 0   Entered by:   Charlsie Quest, CMA   Authorized by:   Neena Rhymes MD   Signed by:   Charlsie Quest, CMA on 04/19/2010   Method used:   Print then Give to Patient   RxID:   [id number] OXYCODONE-ACETAMINOPHEN 5-325 MG TABS (OXYCODONE-ACETAMINOPHEN) 1 q 6 hours as needed breakthru pain  fill on or after 05/20/2010  #90 x 0   Entered by:   Charlsie Quest, CMA   Authorized by:   Neena Rhymes MD   Signed by:   Charlsie Quest, CMA on 04/19/2010   Method used:   Print then Give to Patient   RxID:   [id number] OXYCONTIN 10 MG XR12H-TAB (OXYCODONE HCL) 1 by mouth three times a day fill on or after 05/20/2010  #90 x 0   Entered by:   Charlsie Quest, CMA   Authorized by:   Neena Rhymes MD  Signed by:   Charlsie Quest, CMA on 04/19/2010   Method used:   Print then Give to Patient   RxID:   [id number] ALPRAZOLAM 0.5 MG TABS (ALPRAZOLAM) 1 by mouth once daily as needed.  #90 x 0   Entered by:   Charlsie Quest, CMA   Authorized by:   Neena Rhymes MD   Signed by:   Charlsie Quest, CMA on 04/19/2010   Method used:   Reprint   RxID:   [id number] ALPRAZOLAM XR 1 MG  XR24H-TAB (ALPRAZOLAM) 1 by mouth once daily  #90 x 0   Entered by:   Charlsie Quest, CMA   Authorized by:   Neena Rhymes MD   Signed by:   Charlsie Quest, CMA on 04/19/2010   Method used:   Reprint   RxID:   [id number] OXYCODONE-ACETAMINOPHEN 5-325 MG TABS (OXYCODONE-ACETAMINOPHEN) 1 q 6 hours as needed breakthru pain  fill on or after  04/19/2010  #90 x 0   Entered by:   Charlsie Quest, CMA   Authorized by:   Neena Rhymes MD   Signed by:   Charlsie Quest, CMA on 04/19/2010   Method used:   Reprint   RxID:   [id number] OXYCONTIN 10 MG XR12H-TAB (OXYCODONE HCL) 1 by mouth three times a day fill on or after 04/19/2010  #90 x 0   Entered by:   Charlsie Quest, CMA   Authorized by:   Neena Rhymes MD   Signed by:   Charlsie Quest, CMA on 04/19/2010   Method used:   Reprint   RxID:   [id number] ALPRAZOLAM 0.5 MG TABS (ALPRAZOLAM) 1 by mouth once daily as needed.  #90 x 0   Entered by:   Charlsie Quest, CMA   Authorized by:   Neena Rhymes MD   Signed by:   Charlsie Quest, CMA on 04/19/2010   Method used:   Print then Give to Patient   RxID:   [id number] ALPRAZOLAM XR 1 MG  XR24H-TAB (ALPRAZOLAM) 1 by mouth once daily  #90 x 0   Entered by:   Charlsie Quest, CMA   Authorized by:   Neena Rhymes MD   Signed by:   Charlsie Quest, CMA on 04/19/2010   Method used:   Print then Give to Patient   RxID:   [id number] OXYCODONE-ACETAMINOPHEN 5-325 MG TABS (OXYCODONE-ACETAMINOPHEN) 1 q 6 hours as needed breakthru pain  fill on or after 02/18/2011  #90 x 0   Entered by:   Charlsie Quest, CMA   Authorized by:   Neena Rhymes MD   Signed by:   Charlsie Quest, CMA on 04/19/2010   Method used:   Print then Give to Patient   RxID:   [id number] OXYCONTIN 10 MG XR12H-TAB (OXYCODONE HCL) 1 by mouth three times a day fill on or after 04/19/2010  #90 x 0   Entered by:   Charlsie Quest, CMA   Authorized by:   Shawnie Pons   Signed by:   Charlsie Quest, CMA on 04/19/2010   Method used:   Print then Give to Patient   RxID:   [id number]

## 2010-06-28 ENCOUNTER — Telehealth: Payer: Self-pay | Admitting: *Deleted

## 2010-06-28 NOTE — Telephone Encounter (Signed)
Pt returned from trip recently. He has had "gi" bug, nausea & diarrhea x 3 days. No diarrhea today and he is eating normal diet w/no problems. Pt noticed bright red blood in his stool on yesterday and today. Bleeding is only during BM and there is no pain. He has had hemorrhoids in the past and feels they may be irritated. Advised patient to continue to monitor and call office with any changes.

## 2010-07-10 LAB — CBC
HCT: 45.4 % (ref 39.0–52.0)
Hemoglobin: 15.7 g/dL (ref 13.0–17.0)
MCHC: 34.5 g/dL (ref 30.0–36.0)
MCV: 95 fL (ref 78.0–100.0)
Platelets: 245 10*3/uL (ref 150–400)
RBC: 4.78 MIL/uL (ref 4.22–5.81)
RDW: 13.9 % (ref 11.5–15.5)
WBC: 5.8 10*3/uL (ref 4.0–10.5)

## 2010-07-10 LAB — TYPE AND SCREEN
ABO/RH(D): A NEG
Antibody Screen: NEGATIVE

## 2010-07-10 LAB — BASIC METABOLIC PANEL
BUN: 12 mg/dL (ref 6–23)
CO2: 29 mEq/L (ref 19–32)
Calcium: 9.2 mg/dL (ref 8.4–10.5)
Chloride: 98 mEq/L (ref 96–112)
Creatinine, Ser: 0.76 mg/dL (ref 0.4–1.5)
GFR calc Af Amer: >60 mL/min (ref >60)
GFR calc non Af Amer: >60 mL/min (ref >60)
Glucose, Bld: 109 mg/dL — ABNORMAL HIGH (ref 70–99)
Potassium: 4.3 mEq/L (ref 3.5–5.1)
Sodium: 135 mEq/L (ref 135–145)

## 2010-07-12 ENCOUNTER — Telehealth: Payer: Self-pay | Admitting: *Deleted

## 2010-07-12 MED ORDER — OXYCODONE-ACETAMINOPHEN 5-325 MG PO TABS: 1.0000 | ORAL_TABLET | Freq: Four times a day (QID) | ORAL | Status: DC | PRN

## 2010-07-12 MED ORDER — OXYCODONE HCL 10 MG PO TB12: 10.0000 mg | ORAL_TABLET | Freq: Three times a day (TID) | ORAL | Status: DC | PRN

## 2010-07-12 NOTE — Telephone Encounter (Signed)
May use over the counter preparations for hemorrhoids. If painful may use nupercainal ointment - otc.

## 2010-07-12 NOTE — Telephone Encounter (Signed)
Pending signature

## 2010-07-12 NOTE — Telephone Encounter (Signed)
Patient requesting refills of meds: Oxycontin 82m tid #90 - last filled 3/12 Oxy/Apap 5/325 1 q6hrs prn # 90 - last filled 3/12

## 2010-07-12 NOTE — Telephone Encounter (Signed)
[facility] for refill - 30 day supply, 3 Rx's  thanks

## 2010-07-16 NOTE — Telephone Encounter (Signed)
Pt informed prescriptions up front for pick up

## 2010-07-19 ENCOUNTER — Telehealth: Payer: Self-pay | Admitting: *Deleted

## 2010-07-19 NOTE — Telephone Encounter (Signed)
Fax from [facility] on [address] for Alprazolam 0.56m tablet take one tablet once a day prn QTY 90. Last filled 06/13/2010 2. Alprazolam 130mtablet take one tablet by mouth once daily  QTY 90 last filled on 06/13/2010, Please advise refills

## 2010-07-19 NOTE — Telephone Encounter (Signed)
Will need to talk with patient and get clarification on his use of alprazolam: correct dose, 0.5 vs 60m and frequency, qd or tid.

## 2010-07-20 NOTE — Telephone Encounter (Signed)
Pt Takes Alprazolam 57m  1 tablet every morning and the 0.52m1 tablet prn up to tid. Please Advise refill

## 2010-07-20 NOTE — Telephone Encounter (Signed)
lmovm for pt to call back

## 2010-07-22 NOTE — Telephone Encounter (Signed)
Ok for refill as requested: 80m qd # 30 refill x 5; 0.536m1 qhs and q 6 prn #90 x 5

## 2010-07-23 MED ORDER — ALPRAZOLAM ER 1 MG PO TB24: 1.0000 mg | ORAL_TABLET | ORAL | Status: DC

## 2010-07-23 MED ORDER — ALPRAZOLAM 0.5 MG PO TABS: 0.5000 mg | ORAL_TABLET | Freq: Three times a day (TID) | ORAL | Status: DC | PRN

## 2010-07-23 NOTE — Telephone Encounter (Signed)
refill 

## 2010-07-30 ENCOUNTER — Telehealth: Payer: Self-pay | Admitting: *Deleted

## 2010-07-30 NOTE — Telephone Encounter (Signed)
1. Try nasal wings 2. Try nasal inhalational steroid spray - have samples of veramyst. If these things fail - ENT consult

## 2010-07-30 NOTE — Telephone Encounter (Signed)
Pt states his snoring is getting worse. It has been pretty bad the past week that his wife has to sleep in another room. Pt wants to know what he should do to help him. He states this cannot continue. Please Advise

## 2010-08-03 ENCOUNTER — Ambulatory Visit (INDEPENDENT_AMBULATORY_CARE_PROVIDER_SITE_OTHER): Payer: 59 | Admitting: Internal Medicine

## 2010-08-03 VITALS — BP 132/70 | HR 72 | Temp 97.4°F | Wt 182.0 lb

## 2010-08-03 DIAGNOSIS — J209 Acute bronchitis, unspecified: Secondary | ICD-10-CM

## 2010-08-03 MED ORDER — PROMETHAZINE-CODEINE 6.25-10 MG/5ML PO SYRP: 5.0000 mL | ORAL_SOLUTION | ORAL | Status: AC | PRN

## 2010-08-03 MED ORDER — AZITHROMYCIN 500 MG PO TABS: 500.0000 mg | ORAL_TABLET | Freq: Every day | ORAL | Status: DC

## 2010-08-03 MED ORDER — PREDNISONE 10 MG PO TABS: 10.0000 mg | ORAL_TABLET | Freq: Every day | ORAL | Status: AC

## 2010-08-03 MED ORDER — FLUOXETINE HCL 40 MG PO CAPS: 40.0000 mg | ORAL_CAPSULE | Freq: Every day | ORAL | Status: DC

## 2010-08-03 NOTE — Telephone Encounter (Signed)
Informed pt  He had appt today with MD

## 2010-08-03 NOTE — Progress Notes (Signed)
Subjective:    Patient ID: [name], male    DOB: 1946-01-26, 65 y.o.   MRN: [id number]  HPI [name] has had 3 weeks of increased problems with allergic rhinitis and cough. For the past five days it has gotten a lot worse with copious amounts of sputum that has gone from clear to colored sputum. He has had upper airway wheezing and some increased shortness of breath. No documented fever, chills, N/V. He has been using foradil with good results.   Past History:  Past Medical History: Last updated: 02/21/09 UCD Allergic rhinitis Colonic polyps, hx of Diverticulitis, hx of Hypertension Osteoarthritis Hemochromatosis: dx'd 14 years ago last ferritin Aug 11,'08 52 (22-322), Fe 136 CAD-(Minimal coronary plaque in the LAD and right coronary system.  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 July 2006.  Last nuclear stress Aug 11.'08 fixed anterior/inferior defect, no inducible ischemia, EF 81%)      . Physician Roster:         [place] surgeon -Sypher         GS- Mat Hassell Done         ortho- Bednarz/ankle, Dean-shoulder         Card - [facility]-Dr. 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 Foot surgery for removal of bone spurs right foot Nov '08 Reconstruction of Right ankle June '09 ([facility]) L4-5 diskectomy with fusion, cage placement and rods Dec '10 Joya Salm)  Family History: Last updated: 21-Feb-2009 Father - died 25; complications of CAD, prostate cancer, HTN Mother - 17; cancer survivor uterine, ankle edema, macular degeneration 1 brother - high Lipids,  schizophrenic, dependent 1 sister - thyroid diseae Neg - colon cancer, DM, no MI.  Social History: [employer] - BA Married 1972-81yr seperated/divorced. 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 Very busy life- some stress. During '11 discovered an [employer]- $500,000+ loss. Is handling this OK - will keep the business running.     Review of Systems  Constitutional: Negative for fever, chills and activity change.  HENT: Positive for congestion and rhinorrhea. Negative for hearing loss, facial swelling and tinnitus.        [snoring Eyes: Negative for photophobia, pain and discharge.  Respiratory: Positive for cough, chest tightness, shortness of breath and wheezing.   Cardiovascular: Negative for chest pain and leg swelling.  Genitourinary: Negative.   Musculoskeletal: Negative.   Neurological: Negative.   Hematological: Negative.   Psychiatric/Behavioral: Negative.        Objective:   Physical Exam  Constitutional: He is oriented to person, place, and time. He appears well-developed and well-nourished. No distress.  HENT:  Head: Normocephalic and atraumatic.  Eyes: Conjunctivae and EOM are normal.  Neck: Neck supple. No thyromegaly present.  Cardiovascular: Normal rate, regular rhythm and normal heart sounds.   Pulmonary/Chest: Effort normal and breath sounds normal. No respiratory distress. He exhibits no tenderness.  Abdominal: Soft. Bowel sounds are normal.  Lymphadenopathy:    He has no  cervical adenopathy.  Neurological: He is alert and oriented to person, place, and time. He has normal reflexes.  Skin: Skin is warm and dry.  Psychiatric: He has a normal mood and affect. His behavior is normal. Thought content normal.          Assessment & Plan:  1. Acute bronchitis - plan azithromycin, prednisone for tracheal irritation, phen/cod for  cough.  2. Allergic rhinitis with snoring  Plan - trial of veramyst.

## 2010-08-03 NOTE — Patient Instructions (Signed)
Acute respiratory infection - early bronchitis with darkening sputum. Plan - azithromycin 541m once a day for 3 days. Prednisone as directed for the wheezing. Codeine containing cough syrup every 6 hours as needed.  Allergy - snoring and drainage related symptoms. Plan - using the veramyst one spray to each nostril once a day. Follow use by gargling. You should see results in  3-4 days.   Allergic Rhinitis Allergic rhinitis is when the mucous membranes in the nose respond to allergens. Allergens are particles in the air that cause your body to have an allergic reaction. This causes you to release allergic antibodies. Through a chain of events, these eventually cause you to release histamine into the blood stream (hence the use of antihistamines). Although meant to be protective to the body, it is this release that causes your discomfort, such as frequent sneezing, congestion and an itchy runny nose.   CAUSES The pollen allergens may come from grasses, trees, and weeds. This is seasonal allergic rhinitis, or "hay fever." Other allergens cause year-round allergic rhinitis (perennial allergic rhinitis) such as house dust mite allergen, pet dander and mold spores.   SYMPTOMS  Nasal stuffiness (congestion).   Runny, itchy nose with sneezing and tearing of the eyes.   There is often an itching of the mouth, eyes and ears.  It cannot be cured, but it can be controlled with medications. DIAGNOSIS If you are unable to determine the offending allergen, skin or blood testing may find it. TREATMENT  Avoid the allergen.   Medications and allergy shots (immunotherapy) can help.   Hay fever may often be treated with antihistamines in pill or nasal spray forms. Antihistamines block the effects of histamine. There are over-the-counter medicines that may help with nasal congestion and swelling around the eyes. Check with your caregiver before taking or giving this medicine.  If the treatment above does not  work, there are many new medications your caregiver can prescribe. Stronger medications may be used if initial measures are ineffective. Desensitizing injections can be used if medications and avoidance fails. Desensitization is when a patient is given ongoing shots until the body becomes less sensitive to the allergen. Make sure you follow up with your caregiver if problems continue. SEEK MEDICAL CARE IF:    You develop fever (more than 100.18F (38.1 C).   You develop a cough that does not stop easily (persistent).   You have shortness of breath.   You start wheezing.   Symptoms interfere with normal daily activities.  Document Released: 12/18/2000 Document Re-Released: 04/16/2009 EEn[facility] Of Northern KentuckyPatient Information 2011 [place]

## 2010-08-07 ENCOUNTER — Inpatient Hospital Stay (HOSPITAL_COMMUNITY)
Admission: AD | Admit: 2010-08-07 | Discharge: 2010-08-14 | DRG: 308 | Disposition: A | Discharge status: Home / Self Care | Payer: 59 | Source: Ambulatory Visit | Attending: Internal Medicine | Admitting: Internal Medicine

## 2010-08-07 ENCOUNTER — Ambulatory Visit (INDEPENDENT_AMBULATORY_CARE_PROVIDER_SITE_OTHER): Payer: 59 | Admitting: Internal Medicine

## 2010-08-07 ENCOUNTER — Inpatient Hospital Stay (HOSPITAL_COMMUNITY): Payer: 59

## 2010-08-07 VITALS — BP 138/90 | HR 125 | Wt 184.0 lb

## 2010-08-07 DIAGNOSIS — Z96659 Presence of unspecified artificial knee joint: Secondary | ICD-10-CM

## 2010-08-07 DIAGNOSIS — I2510 Atherosclerotic heart disease of native coronary artery without angina pectoris: Secondary | ICD-10-CM | POA: Diagnosis present

## 2010-08-07 DIAGNOSIS — I509 Heart failure, unspecified: Secondary | ICD-10-CM | POA: Diagnosis present

## 2010-08-07 DIAGNOSIS — J45909 Unspecified asthma, uncomplicated: Secondary | ICD-10-CM

## 2010-08-07 DIAGNOSIS — I4891 Unspecified atrial fibrillation: Principal | ICD-10-CM | POA: Diagnosis present

## 2010-08-07 DIAGNOSIS — Z96669 Presence of unspecified artificial ankle joint: Secondary | ICD-10-CM

## 2010-08-07 DIAGNOSIS — Z96619 Presence of unspecified artificial shoulder joint: Secondary | ICD-10-CM

## 2010-08-07 DIAGNOSIS — R7309 Other abnormal glucose: Secondary | ICD-10-CM | POA: Diagnosis present

## 2010-08-07 DIAGNOSIS — I059 Rheumatic mitral valve disease, unspecified: Secondary | ICD-10-CM | POA: Diagnosis present

## 2010-08-07 DIAGNOSIS — I10 Essential (primary) hypertension: Secondary | ICD-10-CM | POA: Diagnosis present

## 2010-08-07 DIAGNOSIS — Z9861 Coronary angioplasty status: Secondary | ICD-10-CM

## 2010-08-07 DIAGNOSIS — E785 Hyperlipidemia, unspecified: Secondary | ICD-10-CM | POA: Diagnosis present

## 2010-08-07 DIAGNOSIS — F411 Generalized anxiety disorder: Secondary | ICD-10-CM | POA: Diagnosis present

## 2010-08-07 DIAGNOSIS — I5031 Acute diastolic (congestive) heart failure: Secondary | ICD-10-CM | POA: Diagnosis present

## 2010-08-07 DIAGNOSIS — I2789 Other specified pulmonary heart diseases: Secondary | ICD-10-CM | POA: Diagnosis present

## 2010-08-07 DIAGNOSIS — Z136 Encounter for screening for cardiovascular disorders: Secondary | ICD-10-CM

## 2010-08-07 LAB — BASIC METABOLIC PANEL
BUN: 15 mg/dL (ref 6–23)
CO2: 28 mEq/L (ref 19–32)
Calcium: 9 mg/dL (ref 8.4–10.5)
Chloride: 98 mEq/L (ref 96–112)
Creatinine, Ser: 0.69 mg/dL (ref 0.4–1.5)
GFR calc Af Amer: >60 mL/min (ref >60)
GFR calc non Af Amer: >60 mL/min (ref >60)
Glucose, Bld: 193 mg/dL — ABNORMAL HIGH (ref 70–99)
Potassium: 3.8 mEq/L (ref 3.5–5.1)
Sodium: 136 mEq/L (ref 135–145)

## 2010-08-07 LAB — PROTIME-INR
INR: 1.19 (ref 0.00–1.49)
Prothrombin Time: 15.3 seconds — ABNORMAL HIGH (ref 11.6–15.2)

## 2010-08-07 LAB — TSH: TSH: 0.282 u[IU]/mL — ABNORMAL LOW (ref 0.350–4.500)

## 2010-08-07 LAB — CARDIAC PANEL(CRET KIN+CKTOT+MB+TROPI)
CK, MB: 6.8 ng/mL (ref 0.3–4.0)
Relative Index: 4.3 — ABNORMAL HIGH (ref 0.0–2.5)
Total CK: 160 U/L (ref 7–232)
Troponin I: <0.3 ng/mL (ref <0.30)

## 2010-08-07 LAB — PRO B NATRIURETIC PEPTIDE
Pro B Natriuretic peptide (BNP): 2882 pg/mL — ABNORMAL HIGH (ref 0–125)
Pro B Natriuretic peptide (BNP): 2910 pg/mL — ABNORMAL HIGH (ref 0–125)

## 2010-08-07 LAB — MRSA PCR SCREENING: MRSA by PCR: NEGATIVE

## 2010-08-07 NOTE — Progress Notes (Signed)
Subjective:    Patient ID: [name], male    DOB: 19-Jul-1945, 65 y.o.   MRN: [id number]  HPI Patient was seen last week for URI/allergy with SOB, wheezing, mucus production. He ha finished a z-pak. He has just started a prednisone burst and taper. He presents today with fluid accumulation, continued SOB and wheezing and a sense of a racing heart. He denies any angina but does have upper abdominal fullness. He has a strong family history for CHF. He has known CAD with stent to Cx in 2005. He has multiple risk factors. He is in a. Fib at rate of 150 by EKG.    Past History:  Past Medical History: Last updated: 02/14/2009 UCD Allergic rhinitis Colonic polyps, hx of Diverticulitis, hx of Hypertension Osteoarthritis Hemochromatosis: dx'd 14 years ago last ferritin Aug 11,'08 52 (22-322), Fe 136 CAD-(Minimal coronary plaque in the LAD and right coronary system.  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 July 2006.  Last nuclear stress Aug 11.'08 fixed anterior/inferior defect, no inducible ischemia, EF 81%)      . Physician Roster:         [place] surgeon -Sypher         GS- Mat Hassell Done         ortho- Bednarz/ankle, Dean-shoulder         Card - [facility]-Dr. 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 Foot surgery for removal of bone spurs right foot Nov '08 Reconstruction of Right ankle June '09 ([facility]) L4-5 diskectomy with fusion, cage placement and rods Dec '10 Joya Salm)  Family History: Last updated: 2010-01-18 Father - died 49; complications of CAD, prostate cancer,  HTN Mother - 58; cancer survivor uterine, ankle edema, macular degeneration 1 brother - high Lipids, schizophrenic, dependent. Deceased CAD/MI 1 sister - thyroid diseae Neg - colon cancer, DM, no MI. Aunt - died CAD/MI  Family History: Father - died 39; complications of CAD, prostate cancer, HTN Mother - 21; cancer survivor uterine, ankle edema, macular degeneration 1 brother - high Lipids, schizophrenic, dependent. Deceased CAD/MI 1 sister - thyroid diseae Neg - colon cancer, DM, no MI. Aunt - died CAD/MI      Review of Systems  Constitutional: Positive for activity change. Negative for fever, chills and fatigue.  HENT: Positive for congestion, sneezing and postnasal drip. Negative for neck pain and tinnitus.   Eyes: Negative.   Respiratory: Negative for apnea, cough, chest tightness, shortness of breath and wheezing.   Cardiovascular: Positive for leg swelling. Negative for chest pain and palpitations.  Gastrointestinal: Positive for abdominal distention. Negative for abdominal pain and constipation.  Genitourinary: Negative.   Musculoskeletal: Positive for back pain, joint swelling and arthralgias.  Skin: Negative for color change and pallor.  Neurological: Negative.   Hematological: Negative for adenopathy.  Psychiatric/Behavioral: Negative.       Objective:   Physical Exam  Constitutional: He is oriented to person, place, and time. He appears well-nourished. No distress.  HENT:  Head: Normocephalic and atraumatic.  Eyes: Conjunctivae and EOM are normal. Pupils are equal, round, and  reactive to light.  Neck: Normal range of motion. Neck supple. No thyromegaly present.  Cardiovascular:       Rapid irregular heart, no murmur, rub or gallop. No JVD.  Pulmonary/Chest: Effort normal. He has wheezes. He has rales.  Abdominal: Soft. Bowel sounds are normal. He exhibits no distension. There is no tenderness. There is no guarding.  Musculoskeletal:       Multiple large  joint surgical scars. Good ROM shoulders, hips.  Neurological: He is alert and oriented to person, place, and time. No cranial nerve deficit.  Skin: Skin is warm and dry. No rash noted. No erythema.  Psychiatric: He has a normal mood and affect. His behavior is normal. Thought content normal.          Assessment & Plan:  1. A. Fib- new onset in a patient with h/o CAD with stenting of the Cx in '06. He does not have frank angina. He does have signs of CHF with peripehral edema, rales, SOB and discomfort.   Plan - admit to tele bed           Diltiazem 46m PO q 6 first dose stat            Furosemide 433mIV q 12           Lab: Cardiac enzymes q8 x3, BNP, Bmet, TSH           12 lead EKG            Cardiology consult  2. Asthmatic bronchitis  Plan - continue prednisone taper            Phen/cod for cough  Other medical problems stable. He will continue on all his home meds including pain meds.

## 2010-08-08 DIAGNOSIS — I059 Rheumatic mitral valve disease, unspecified: Secondary | ICD-10-CM

## 2010-08-08 LAB — GLUCOSE, CAPILLARY
Glucose-Capillary: 150 mg/dL — ABNORMAL HIGH (ref 70–99)
Glucose-Capillary: 128 mg/dL — ABNORMAL HIGH (ref 70–99)
Glucose-Capillary: 133 mg/dL — ABNORMAL HIGH (ref 70–99)
Glucose-Capillary: 189 mg/dL — ABNORMAL HIGH (ref 70–99)
Glucose-Capillary: 120 mg/dL — ABNORMAL HIGH (ref 70–99)

## 2010-08-08 LAB — CARDIAC PANEL(CRET KIN+CKTOT+MB+TROPI)
CK, MB: 4.7 ng/mL — ABNORMAL HIGH (ref 0.3–4.0)
CK, MB: 4.5 ng/mL — ABNORMAL HIGH (ref 0.3–4.0)
Relative Index: 4.4 — ABNORMAL HIGH (ref 0.0–2.5)
Relative Index: INVALID (ref 0.0–2.5)
Total CK: 106 U/L (ref 7–232)
Total CK: 96 U/L (ref 7–232)
Troponin I: <0.3 ng/mL (ref <0.30)
Troponin I: <0.3 ng/mL (ref <0.30)

## 2010-08-08 LAB — CBC
HCT: 38.3 % — ABNORMAL LOW (ref 39.0–52.0)
Hemoglobin: 12.7 g/dL — ABNORMAL LOW (ref 13.0–17.0)
MCH: 31.9 pg (ref 26.0–34.0)
MCHC: 33.2 g/dL (ref 30.0–36.0)
MCV: 96.2 fL (ref 78.0–100.0)
Platelets: 249 10*3/uL (ref 150–400)
RBC: 3.98 MIL/uL — ABNORMAL LOW (ref 4.22–5.81)
RDW: 13.7 % (ref 11.5–15.5)
WBC: 5.6 10*3/uL (ref 4.0–10.5)

## 2010-08-08 LAB — HEMOGLOBIN A1C
Hgb A1c MFr Bld: 6.1 % — ABNORMAL HIGH (ref <5.7)
Mean Plasma Glucose: 128 mg/dL — ABNORMAL HIGH (ref <117)

## 2010-08-08 LAB — COMPREHENSIVE METABOLIC PANEL
ALT: 45 U/L (ref 0–53)
AST: 29 U/L (ref 0–37)
Albumin: 3.4 g/dL — ABNORMAL LOW (ref 3.5–5.2)
Alkaline Phosphatase: 99 U/L (ref 39–117)
BUN: 20 mg/dL (ref 6–23)
CO2: 32 mEq/L (ref 19–32)
Calcium: 8.8 mg/dL (ref 8.4–10.5)
Chloride: 98 mEq/L (ref 96–112)
Creatinine, Ser: 0.87 mg/dL (ref 0.4–1.5)
GFR calc Af Amer: >60 mL/min (ref >60)
GFR calc non Af Amer: >60 mL/min (ref >60)
Glucose, Bld: 137 mg/dL — ABNORMAL HIGH (ref 70–99)
Potassium: 3.6 mEq/L (ref 3.5–5.1)
Sodium: 138 mEq/L (ref 135–145)
Total Bilirubin: 0.3 mg/dL (ref 0.3–1.2)
Total Protein: 6.3 g/dL (ref 6.0–8.3)

## 2010-08-08 LAB — PROTIME-INR
INR: 1.2 (ref 0.00–1.49)
Prothrombin Time: 15.4 seconds — ABNORMAL HIGH (ref 11.6–15.2)

## 2010-08-08 LAB — HEPARIN LEVEL (UNFRACTIONATED)
Heparin Unfractionated: 0.13 IU/mL — ABNORMAL LOW (ref 0.30–0.70)
Heparin Unfractionated: <0.1 IU/mL — ABNORMAL LOW (ref 0.30–0.70)
Heparin Unfractionated: 0.22 IU/mL — ABNORMAL LOW (ref 0.30–0.70)

## 2010-08-08 LAB — LIPID PANEL
Cholesterol: 101 mg/dL (ref 0–200)
HDL: 41 mg/dL (ref >39)
LDL Cholesterol: 48 mg/dL (ref 0–99)
Total CHOL/HDL Ratio: 2.5 RATIO
Triglycerides: 62 mg/dL (ref <150)
VLDL: 12 mg/dL (ref 0–40)

## 2010-08-08 LAB — FERRITIN: Ferritin: 52 ng/mL (ref 22–322)

## 2010-08-09 LAB — GLUCOSE, CAPILLARY
Glucose-Capillary: 107 mg/dL — ABNORMAL HIGH (ref 70–99)
Glucose-Capillary: 129 mg/dL — ABNORMAL HIGH (ref 70–99)

## 2010-08-09 LAB — BASIC METABOLIC PANEL
BUN: 20 mg/dL (ref 6–23)
CO2: 32 mEq/L (ref 19–32)
Calcium: 9.1 mg/dL (ref 8.4–10.5)
Chloride: 92 mEq/L — ABNORMAL LOW (ref 96–112)
Creatinine, Ser: 0.84 mg/dL (ref 0.4–1.5)
GFR calc Af Amer: >60 mL/min (ref >60)
GFR calc non Af Amer: >60 mL/min (ref >60)
Glucose, Bld: 99 mg/dL (ref 70–99)
Potassium: 3.4 mEq/L — ABNORMAL LOW (ref 3.5–5.1)
Sodium: 135 mEq/L (ref 135–145)

## 2010-08-09 LAB — CBC
HCT: 44 % (ref 39.0–52.0)
Hemoglobin: 14.5 g/dL (ref 13.0–17.0)
MCH: 31.9 pg (ref 26.0–34.0)
MCHC: 33 g/dL (ref 30.0–36.0)
MCV: 96.9 fL (ref 78.0–100.0)
Platelets: 263 10*3/uL (ref 150–400)
RBC: 4.54 MIL/uL (ref 4.22–5.81)
RDW: 13.9 % (ref 11.5–15.5)
WBC: 7.6 10*3/uL (ref 4.0–10.5)

## 2010-08-09 LAB — HEPARIN LEVEL (UNFRACTIONATED)
Heparin Unfractionated: 0.23 IU/mL — ABNORMAL LOW (ref 0.30–0.70)
Heparin Unfractionated: 0.72 IU/mL — ABNORMAL HIGH (ref 0.30–0.70)

## 2010-08-09 LAB — PROTIME-INR
INR: 1.16 (ref 0.00–1.49)
Prothrombin Time: 15 seconds (ref 11.6–15.2)

## 2010-08-09 NOTE — Progress Notes (Signed)
Dr. Linda Hedges,  I will bill as an admit.  Thanks Rodman Key

## 2010-08-10 ENCOUNTER — Inpatient Hospital Stay (HOSPITAL_COMMUNITY): Payer: 59

## 2010-08-10 LAB — GLUCOSE, CAPILLARY
Glucose-Capillary: 101 mg/dL — ABNORMAL HIGH (ref 70–99)
Glucose-Capillary: 159 mg/dL — ABNORMAL HIGH (ref 70–99)
Glucose-Capillary: 184 mg/dL — ABNORMAL HIGH (ref 70–99)
Glucose-Capillary: 93 mg/dL (ref 70–99)

## 2010-08-10 LAB — BASIC METABOLIC PANEL
BUN: 21 mg/dL (ref 6–23)
CO2: 34 mEq/L — ABNORMAL HIGH (ref 19–32)
Calcium: 9.1 mg/dL (ref 8.4–10.5)
Chloride: 94 mEq/L — ABNORMAL LOW (ref 96–112)
Creatinine, Ser: 0.86 mg/dL (ref 0.4–1.5)
GFR calc Af Amer: >60 mL/min (ref >60)
GFR calc non Af Amer: >60 mL/min (ref >60)
Glucose, Bld: 109 mg/dL — ABNORMAL HIGH (ref 70–99)
Potassium: 3.3 mEq/L — ABNORMAL LOW (ref 3.5–5.1)
Sodium: 135 mEq/L (ref 135–145)

## 2010-08-10 LAB — MAGNESIUM: Magnesium: 2.5 mg/dL (ref 1.5–2.5)

## 2010-08-10 LAB — PRO B NATRIURETIC PEPTIDE: Pro B Natriuretic peptide (BNP): 1021 pg/mL — ABNORMAL HIGH (ref 0–125)

## 2010-08-10 LAB — PHOSPHORUS: Phosphorus: 3.6 mg/dL (ref 2.3–4.6)

## 2010-08-11 ENCOUNTER — Inpatient Hospital Stay (HOSPITAL_COMMUNITY): Payer: 59

## 2010-08-11 LAB — BASIC METABOLIC PANEL
BUN: 20 mg/dL (ref 6–23)
CO2: 33 mEq/L — ABNORMAL HIGH (ref 19–32)
Calcium: 9.2 mg/dL (ref 8.4–10.5)
Chloride: 95 mEq/L — ABNORMAL LOW (ref 96–112)
Creatinine, Ser: 0.95 mg/dL (ref 0.4–1.5)
GFR calc Af Amer: >60 mL/min (ref >60)
GFR calc non Af Amer: >60 mL/min (ref >60)
Glucose, Bld: 108 mg/dL — ABNORMAL HIGH (ref 70–99)
Potassium: 4 mEq/L (ref 3.5–5.1)
Sodium: 136 mEq/L (ref 135–145)

## 2010-08-11 LAB — CBC
HCT: 45.6 % (ref 39.0–52.0)
Hemoglobin: 15.1 g/dL (ref 13.0–17.0)
MCH: 31.9 pg (ref 26.0–34.0)
MCHC: 33.1 g/dL (ref 30.0–36.0)
MCV: 96.4 fL (ref 78.0–100.0)
Platelets: 259 10*3/uL (ref 150–400)
RBC: 4.73 MIL/uL (ref 4.22–5.81)
RDW: 13.9 % (ref 11.5–15.5)
WBC: 7.3 10*3/uL (ref 4.0–10.5)

## 2010-08-11 LAB — GLUCOSE, CAPILLARY
Glucose-Capillary: 111 mg/dL — ABNORMAL HIGH (ref 70–99)
Glucose-Capillary: 197 mg/dL — ABNORMAL HIGH (ref 70–99)
Glucose-Capillary: 138 mg/dL — ABNORMAL HIGH (ref 70–99)
Glucose-Capillary: 104 mg/dL — ABNORMAL HIGH (ref 70–99)

## 2010-08-11 MED ORDER — TECHNETIUM TC 99M TETROFOSMIN IV KIT
10.0000 | PACK | Freq: Once | INTRAVENOUS | Status: AC | PRN
  Administered 2010-08-10: 10 via INTRAVENOUS

## 2010-08-11 MED ORDER — TECHNETIUM TC 99M TETROFOSMIN IV KIT
30.0000 | PACK | Freq: Once | INTRAVENOUS | Status: AC | PRN
  Administered 2010-08-11: 30 via INTRAVENOUS

## 2010-08-12 LAB — BASIC METABOLIC PANEL
BUN: 30 mg/dL — ABNORMAL HIGH (ref 6–23)
CO2: 28 mEq/L (ref 19–32)
Calcium: 9.1 mg/dL (ref 8.4–10.5)
Chloride: 91 mEq/L — ABNORMAL LOW (ref 96–112)
Creatinine, Ser: 1.08 mg/dL (ref 0.4–1.5)
GFR calc Af Amer: >60 mL/min (ref >60)
GFR calc non Af Amer: >60 mL/min (ref >60)
Glucose, Bld: 101 mg/dL — ABNORMAL HIGH (ref 70–99)
Potassium: 3.1 mEq/L — ABNORMAL LOW (ref 3.5–5.1)
Sodium: 132 mEq/L — ABNORMAL LOW (ref 135–145)

## 2010-08-12 LAB — GLUCOSE, CAPILLARY
Glucose-Capillary: 117 mg/dL — ABNORMAL HIGH (ref 70–99)
Glucose-Capillary: 146 mg/dL — ABNORMAL HIGH (ref 70–99)
Glucose-Capillary: 129 mg/dL — ABNORMAL HIGH (ref 70–99)

## 2010-08-12 LAB — CBC
HCT: 45.8 % (ref 39.0–52.0)
Hemoglobin: 15.3 g/dL (ref 13.0–17.0)
MCH: 32.1 pg (ref 26.0–34.0)
MCHC: 33.4 g/dL (ref 30.0–36.0)
MCV: 96 fL (ref 78.0–100.0)
Platelets: 249 10*3/uL (ref 150–400)
RBC: 4.77 MIL/uL (ref 4.22–5.81)
RDW: 13.8 % (ref 11.5–15.5)
WBC: 7.9 10*3/uL (ref 4.0–10.5)

## 2010-08-13 ENCOUNTER — Inpatient Hospital Stay (HOSPITAL_COMMUNITY): Payer: 59

## 2010-08-13 LAB — GLUCOSE, CAPILLARY
Glucose-Capillary: 171 mg/dL — ABNORMAL HIGH (ref 70–99)
Glucose-Capillary: 111 mg/dL — ABNORMAL HIGH (ref 70–99)
Glucose-Capillary: 127 mg/dL — ABNORMAL HIGH (ref 70–99)
Glucose-Capillary: 129 mg/dL — ABNORMAL HIGH (ref 70–99)
Glucose-Capillary: 120 mg/dL — ABNORMAL HIGH (ref 70–99)
Glucose-Capillary: 132 mg/dL — ABNORMAL HIGH (ref 70–99)

## 2010-08-13 LAB — CBC
HCT: 47.7 % (ref 39.0–52.0)
Hemoglobin: 16 g/dL (ref 13.0–17.0)
MCH: 31.8 pg (ref 26.0–34.0)
MCHC: 33.5 g/dL (ref 30.0–36.0)
MCV: 94.8 fL (ref 78.0–100.0)
Platelets: 223 10*3/uL (ref 150–400)
RBC: 5.03 MIL/uL (ref 4.22–5.81)
RDW: 13.7 % (ref 11.5–15.5)
WBC: 8.3 10*3/uL (ref 4.0–10.5)

## 2010-08-13 LAB — BASIC METABOLIC PANEL
BUN: 27 mg/dL — ABNORMAL HIGH (ref 6–23)
CO2: 30 mEq/L (ref 19–32)
Calcium: 9.2 mg/dL (ref 8.4–10.5)
Chloride: 93 mEq/L — ABNORMAL LOW (ref 96–112)
Creatinine, Ser: 1.04 mg/dL (ref 0.4–1.5)
GFR calc Af Amer: >60 mL/min (ref >60)
GFR calc non Af Amer: >60 mL/min (ref >60)
Glucose, Bld: 112 mg/dL — ABNORMAL HIGH (ref 70–99)
Potassium: 3.9 mEq/L (ref 3.5–5.1)
Sodium: 131 mEq/L — ABNORMAL LOW (ref 135–145)

## 2010-08-14 ENCOUNTER — Other Ambulatory Visit (HOSPITAL_COMMUNITY): Payer: 59

## 2010-08-14 ENCOUNTER — Telehealth: Payer: Self-pay | Admitting: *Deleted

## 2010-08-14 LAB — CBC
HCT: 44.1 % (ref 39.0–52.0)
Hemoglobin: 14.7 g/dL (ref 13.0–17.0)
MCH: 31.8 pg (ref 26.0–34.0)
MCHC: 33.3 g/dL (ref 30.0–36.0)
MCV: 95.5 fL (ref 78.0–100.0)
Platelets: 187 10*3/uL (ref 150–400)
RBC: 4.62 MIL/uL (ref 4.22–5.81)
RDW: 13.9 % (ref 11.5–15.5)
WBC: 6.2 10*3/uL (ref 4.0–10.5)

## 2010-08-14 LAB — GLUCOSE, CAPILLARY: Glucose-Capillary: 93 mg/dL (ref 70–99)

## 2010-08-14 MED ORDER — CLOPIDOGREL BISULFATE 75 MG PO TABS: 75.0000 mg | ORAL_TABLET | Freq: Every day | ORAL | Status: DC

## 2010-08-14 MED ORDER — HYDROCHLOROTHIAZIDE 25 MG PO TABS: 25.0000 mg | ORAL_TABLET | Freq: Every day | ORAL | Status: DC

## 2010-08-14 MED ORDER — LEFLUNOMIDE 10 MG PO TABS: 10.0000 mg | ORAL_TABLET | Freq: Every day | ORAL | Status: DC

## 2010-08-14 NOTE — Telephone Encounter (Signed)
Refills

## 2010-08-15 ENCOUNTER — Other Ambulatory Visit: Payer: Self-pay | Admitting: Internal Medicine

## 2010-08-15 NOTE — Consult Note (Signed)
  [name], [name]            ACCOUNT NO.:  [id number]  MEDICAL RECORD NO.:  [id number]           PATIENT TYPE:  I  LOCATION:  2002                         FACILITY:  [facility]  PHYSICIAN:  Wallis Bamberg. Johnsie Cancel, MD, FACCDATE OF BIRTH:  September 07, 1945  DATE OF CONSULTATION: DATE OF DISCHARGE:                                TEE/DCC   A 65 year old patient with atrial fibrillation.  The patient was on therapeutic Pradaxa.  Transesophageal echo was requested by his primary physician due to difficulty controlling his AFib rate.  The patient required 10 of Versed and 100 mcg of fentanyl to sedate him.  Even with this dosage, he was still semi-alert during the procedure.  Transgastric imaging revealed normal LV function, EF was 60%.  Mitral valve was mildly thickened with trivial MR.  Left atrium was mildly dilated.  The atrial septum was mildly redundant without a PFO.  Right-sided cardiac chambers were normal.  Aortic valve was trileaflet and normal.  Left atrial appendage was small and somewhat hypercontractile.  There is no spontaneous contrast or left atrial appendage clot.  Because the patient was still semi-alert, I chose to wait for anesthesia.  Dr. Tamala Julian from anesthesia administered 50 of propofol and 40 of lidocaine.  A single 200-joule biphasic shock was delivered.  The patient converted from atrial fibrillation at a rate of 120 to normal sinus rhythm at a rate of 78.  IMPRESSION:  Successful transesophageal echocardiography guided cardioversion with no immediate neurological sequela.  The patient on therapeutic Pradaxa.     Wallis Bamberg. Johnsie Cancel, MD, [facility]     PCN/MEDQ  D:  08/13/2010  T:  08/14/2010  Job:  [id number]  Electronically Signed by Jenkins Rouge MD [facility] on 08/15/2010 09:47:07 AM

## 2010-08-18 NOTE — Consult Note (Signed)
[name], [name]            ACCOUNT NO.:  [id number]  MEDICAL RECORD NO.:  [id number]           PATIENT TYPE:  I  LOCATION:  2922                         FACILITY:  [facility]  PHYSICIAN:  Denice Bors. Stanford Breed, MD, FACCDATE OF BIRTH:  October 23, 1945  DATE OF CONSULTATION:  08/07/2010 DATE OF DISCHARGE:                                CONSULTATION   PRIMARY CARE PHYSICIAN:  Heinz Knuckles. Norins, MD  PRIMARY CARDIOLOGIST:  Dr. Minus Breeding.  CHIEF COMPLAINT:  AFib with RVR.  HISTORY OF PRESENT ILLNESS:  [name] is a 65 year old male with a history of coronary artery disease.  He had problems with weight gain over the last couple of months, gaining about 10 pounds slowly and then about 15 pounds while he was on vacation.  He lost 10 pounds of that, but he developed an upper respiratory infection approximately a week ago.  In the last week, he has gained about 10 pounds.  He noted increased shortness of breath.  He did not have chest pain.  Because of his cough and significant amounts of yellowish green sputum, he saw Dr. Linda Hedges on April 27 and received antibiotics and prednisone.  He took the antibiotics as prescribed but did not start the prednisone until yesterday because of confusion about the dosing.  His sputum is improving.  He is still extremely wheezy.  He describes PND and orthopnea.  He has dyspnea on exertion and lower extremity edema within the last week as well.  The abdominal distention is also new.  He denies shortness of breath at rest.  He has not had any palpitations and until Dr. Linda Hedges mentioned his elevated heart rate, he was unaware of it.  He appears slightly anxious but not acutely uncomfortable at rest on O2.  PAST MEDICAL HISTORY: 1. Status post cardiac catheterization in July 2006 showing LAD,     diagonal, circumflex and RCA with luminal irregularities, OM1 had a     95% mid stenosis which was treated with PTCA and 2.5 x 20 mm Taxus     stent x2  secondary to a spiral dissection but reducing the stenosis     to 0. 2. Hypertension. 3. Dyslipidemia. 4. 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 5. Osteoarthritis. 6. Anxiety. 7. History of EtOH use.  SURGICAL HISTORY:  He is status post bilateral knee surgeries x2, right ankle replacement, foot surgery, back surgery, left total knee replacement, left shoulder hemiarthroplasty, right hernia repair and right carpal tunnel.  SOCIAL HISTORY:  [place] in [name] with his wife.  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  He denies any history of tobacco use and drinks a fifth of scotch a week.  He denies drug abuse.  FAMILY HISTORY:  His mother is alive at 71 with a history of heart failure.  His father died at 66 with a history of heart failure and coronary artery disease.  A brother died at 105 with an MI.  REVIEW OF SYSTEMS:  He has been coughing and wheezing for a week.  He has multiple chronic arthralgias.  He  denies reflux symptoms or melena. He has anxiety.  Other respiratory symptoms are described above.  He has not had fevers, chills or sweats.  Full 14-point review of systems is otherwise negative except as stated in the HPI.  PHYSICAL EXAMINATION:  VITAL SIGNS:  Temperature 98.9, blood pressure 125/69, heart rate 149, respiratory rate 20, O2 saturation 95% on room air.  GENERAL:  He is a well-developed, well-nourished white male in no acute distress except for some anxiety. HEENT:  Normal. NECK:  There is no lymphadenopathy, thyromegaly, or bruit noted.  He has JVD at 10-12 cm. CARDIOVASCULAR:  His heart is irregular and rapid in rate and rhythm with an S1, S2 and no significant murmur or gallop is noted.  Distal pulses are intact in all 4 extremities. LUNGS:  He has inspiratory and expiratory wheeze as well as some rales and possibly some crackles. SKIN:  No rashes or lesions are noted. ABDOMEN:  Soft and  nontender with active bowel sounds. EXTREMITIES:  There is no cyanosis or clubbing and 2+ edema is noted. MUSCULOSKELETAL:  He has a chronic right ankle deformity but no spine or CVA tenderness. NEURO:  He is alert and oriented.  Cranial nerves II-XII grossly intact.  Chest x-ray is pending.  EKG; AFib RVR, rate 153 with rightward axis and no other acute abnormalities.  LABORATORY VALUES:  TSH is pending.  INR 1.19.  Sodium 136, potassium 3.8, chloride 98, CO2 of 28, BUN 15, creatinine 0.69, glucose 193.  CK- MB 160/6.8 with a troponin I of less than 0.3 and a BNP of 2882.  IMPRESSION:  [name] was seen today by Dr. Stanford Breed, the patient evaluated and the data reviewed.  He is a 65 year old male with a past medical history of hypertension, coronary artery disease, and hemochromatosis who is being evaluated for AFib and CHF.  He had upper respiratory infection symptoms about a week ago and was appropriately treated, but now has subsequently developed increased dyspnea on exertion, orthopnea, pedal edema, and weight gain.  He has not had chest pain or palpitations.  He was seen by Dr. Linda Hedges today and noted to be in AFib RVR.  He was admitted.  His BNP is significantly elevated at 2082.  His EKG shows AFib with RVR and right axis deviation.  Hemoglobin is pending.  PLAN: 1. AFib RVR:  We will check an echo.  Dr. Linda Hedges has already ordered a     TSH.  We will add IV Cardizem for rate control and transfer him to     step-down so this can be titrated.  We will add IV heparin as well.     He has embolic risk factors of CHF, hypertension, and coronary     artery disease, so he will need long-term anticoagulation.  We will     favor rivaroxaban with this.  He may need a TEE cardioversion. 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  We will check a ferritin level as well.     If his left ventricular function is decreased, he will need a cath.     For cardiac screening purposes, we will check a lipid profile and     hemoglobin A1c as well.  His upper respiratory infection and other     medical problems  we will leave to primary care, but we will     continue to follow him closely with you.     Rosaria Ferries, PA-C   ______________________________ Denice Bors. Stanford Breed, MD, [facility]    RB/MEDQ  D:  08/07/2010  T:  08/08/2010  Job:  [id number]  Electronically Signed by Rosaria Ferries PA-C on 08/16/2010 08:00:31 PM Electronically Signed by Kirk Ruths MD [facility] on 08/18/2010 07:15:28 AM

## 2010-08-21 ENCOUNTER — Telehealth: Payer: Self-pay | Admitting: *Deleted

## 2010-08-21 MED ORDER — FLUTICASONE PROPIONATE 50 MCG/ACT NA SUSP: 2.0000 | Freq: Every day | NASAL | Status: DC

## 2010-08-21 NOTE — Telephone Encounter (Signed)
Pt called and states he is still coughing up quite a bit of dark yellow sputum. Still wheezing he has finished antibiotic and mucinex. He does not fell as if he needs to be seen again he would just like advisement on what he should do. If you suggest starting on another antibiotic. Please Advise.

## 2010-08-21 NOTE — Telephone Encounter (Signed)
In the absence of fever, chills or pleuritic pain will not repeat antibiotics. Plan - fluticasone nasal spray - 1 spray to each nostril bid - he had been given a sample of veramyst and I recall him saying that this did help some.           advair discuss: 250/50 in inhalation AM and HS for wheezing.           For persistent symptoms will need office follow-up

## 2010-08-21 NOTE — Op Note (Signed)
[name]            ACCOUNT NO.:  [id number]   MEDICAL RECORD NO.:  [id number]          PATIENT TYPE:  AMB   LOCATION:  [facility]                          FACILITY:  [facility]   PHYSICIAN:  Youlanda Mighty. Sypher, M.D. DATE OF BIRTH:  January 17, 1946   DATE OF PROCEDURE:  05/21/2007  DATE OF DISCHARGE:  05/21/2007                               OPERATIVE REPORT   PREOPERATIVE DIAGNOSES:  1. 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  2. Complex volar ganglions/myxoid cyst, right wrist, overlying      radioscaphoid articulation with multiple osteochondral loose bodies      suggested on preoperative x-ray.   POSTOPERATIVE DIAGNOSES:  1. Right carpal tunnel syndrome.  2. Right complex volar ganglion with ossific loose bodies within the      myxoid cyst.   OPERATION:  1. Release of right transverse carpal ligament.  2. Resection of volar myxoid cyst with removal of 3 osteochondral      loose bodies.   OPERATING SURGEON:  Youlanda Mighty. Sypher, M.D.   ASSISTANT:  Marily Lente. Dasnoit, P.A.-C.   ANESTHESIA:  General by LMA.   SUPERVISING ANESTHESIOLOGIST:  Soledad Gerlach, MD   INDICATIONS:  [name] is a 65 year old gentleman referred  through the courtesy of Dr. Yehuda Mao of [facility] for evaluation and management of bilateral hand numbness,  hand arthritis, and a volar myxoid cyst on the right.   [name] had electrodiagnostic studies which confirmed significant  bilateral carpal tunnel syndrome.  [name] requested that we resect his  ganglion and release his transverse carpal ligament.   Preoperatively [name] was advised that volar ganglions have a pesky tendency  to recur, despite our best efforts at debridement.  [name] also understands  that at times the radial artery is in jeopardy with this dissection,  although with our microsurgical skills this is not typically a  predicament for our patient's.  After informed consent [name] is brought to  the operating room at this time.   PROCEDURE:  [name] is brought to the operating room and placed  in the supine position on the operating table.  Following an anesthesia  consult with Dr. Ola Spurr, general anesthesia by LMA was recommended  and accepted.   [name] was brought to room #8, placed in supine position on the operating  table, and under Dr. Blane Ohara direct supervision general anesthesia  by LMA technique induced.  The right arm was prepped with Betadine soap  and solution, sterilely draped.  No antibiotics were provided.  The  right arm was exsanguinated with Esmarch bandage, and the arterial  tourniquet on the proximal right brachium inflated to 220 mmHg.  The  procedure commenced with a short incision in the line of the ring finger  and the palm.  Subcutaneous tissue was carefully divided along the  palmar fascia.  This was split longitudinally to reveal the common  sensory branch of the median nerve.   These were followed back to the transverse carpal ligament which was  gently isolated from the median nerve and the ulnar bursa.  The  transverse carpal ligament was  released along its ulnar border extending  into the distal forearm.  This widely opened the carpal canal.  No  masses or other predicaments were noted.  Bleeding points along margin  of the released ligament were electrocauterized with bipolar current  followed by repair of the skin with intradermal 3-0 Prolene suture.   Attention then directed to the volar aspect of the wrist overlying the  radioscaphoid articulation.  A zig-zag incision was fashioned in the  skin creases followed by careful identification of the radial  superficial sensory branches, and the lateral antebrachial cutaneous  sensory branches.  A complex ganglion was presenting deep to the radial  artery bifurcation.  This was meticulously dissected off the  superficial, and dorsal branches of the radial artery.  The cutaneous   nerves were protected while the cyst was circumferentially dissected.  The cyst was entered and 3 ossific bodies removed that were evident on  the preoperative x-ray.   The cyst was followed down to the radioscaphoid articulation at the  volar radiocarpal ligaments.  The cyst was removed at the level of the  joint capsule.  The ligaments were then gently debrided followed by use  of electrocautery to desiccate the superficial cells in an effort to try  to prevent recurrence of the cyst.  The tourniquet was released with  immediate capillary refill to the fingers and thumb.  The radial artery  was patent and uninjured.   The wound was then repaired with subdermal sutures of 4-0 Vicryl and  intradermal 3-0 Prolene with Steri-Strips.  [name] was placed in a  compressive dressing with the volar plaster splint maintaining the wrist  in 5 degrees of dorsiflexion.   AFTERCARE:  [name] is provided a prescription for Percocet 5 mg one p.o. q.4-  6 h. p.r.n. pain, 20 tablets without refill.  I will see back for  followup in 1 week for dressing change, and advancement to his exercise  program.      Youlanda Mighty. Sypher, M.D.  Electronically Signed     RVS/MEDQ  D:  05/21/2007  T:  05/22/2007  Job:  [id number]   cc:   Anson Oregon, M.D.

## 2010-08-24 NOTE — Op Note (Signed)
NAMEKENLEY, [name]            ACCOUNT NO.:  [id number]   MEDICAL RECORD NO.:  [id number]          PATIENT TYPE:  AMB   LOCATION:  DAY                          FACILITY:  [facility]   PHYSICIAN:  Isabel Caprice. Hassell Done, MD  DATE OF BIRTH:  01/27/1946   DATE OF PROCEDURE:  06/23/2006  DATE OF DISCHARGE:                               OPERATIVE REPORT   PREOPERATIVE DIAGNOSIS:  Right inguinal hernia.   POSTOPERATIVE DIAGNOSIS:  Right indirect inguinal hernia with weakened  floor.   PROCEDURE:  Repair of transversalis floor with mesh onlay of Marlex  mesh.  After row high ligation of a sac indirect hernia.   DESCRIPTION OF PROCEDURE:  Mr. Rosenberg is a 65 year old white male with  underlying history of hemochromatosis who comes in for hernia repair.  He was placed in the supine position and prepped with Techni-Care.  A  small oblique incision was made and carried down through fat through the  external oblique which I incised.  The cord was mobilized and a Penrose  drain was placed about it.  The ilioinguinal nerve branch was retracted  with the superior flap of fascia and I found immediately a fairly large  indirect hernia which I dissected free from these cord structures and  then I put my finger in it, dissected it as high as I could and then  twisted off and closed with figure-of-eight suture of 2-0 silk.  The  excess sac was excised.  The floor was bulging as well and  I excised  that and there was fat protruding through the floor.  We ahead and  imbricated this with running 2-0 Prolene.  The floor was then repaired  with a piece of Marlex mesh cut to fit and sutured along the inguinal  ligament with running 2-0 Prolene below and then above and then sutured  to itself and then tucked beneath the ring and tacked with a single 2-0  Prolene.  External oblique was closed with running 2-0 Vicryl and then 4-  0 Vicryl used in the subcutaneous tissue subcuticularly and then a final  subcuticular closure of running 5-0 Vicryl and Benzoin and Steri-Strips  was employed.  The patient given a prescription for Tylox to take for  pain and will be followed up in the office in 3 weeks.      Isabel Caprice Hassell Done, MD  Electronically Signed     MBM/MEDQ  D:  06/23/2006  T:  06/24/2006  Job:  [id number]   cc:   Doree Albee, M.D.  Fax: [phone]   Jessy Oto, M.D.  Fax: [place] Terance Hart, M.D.  Fax: [phone]

## 2010-08-24 NOTE — Op Note (Signed)
   NAME:  [name], [name]                      ACCOUNT NO.:  [id number]   MEDICAL RECORD NO.:  [id number]                   PATIENT TYPE:  AMB   LOCATION:  DAY                                  FACILITY:  [facility]   PHYSICIAN:  Kipp Brood. Gladstone Lighter, M.D.             DATE OF BIRTH:  20-Oct-1945   DATE OF PROCEDURE:  12/24/2001  DATE OF DISCHARGE:                                 OPERATIVE REPORT   SURGEON:  Kipp Brood. Gioffre, M.D.   ASSISTANT:  Philips J. Eulas Post, M.D.   PREOPERATIVE DIAGNOSES:  1. Previous Repicci uniknee, right knee medial compartment.  2. Acute severe synovitis, right knee.   POSTOPERATIVE DIAGNOSES:  1. Previous Repicci uniknee, right knee medial compartment.  2. Acute severe synovitis, right knee.   OPERATION:  1. Open synovectomy of right knee.  2. Inspection of the [employer] itself.  3. We did cultures and sensitivities for aerobic and anaerobic and a Gram     stain.   DESCRIPTION OF PROCEDURE:  Under general anesthesia, routine orthopedic prep  and draping of the right lower extremity was carried out. I excised the old  incision site. Immediately upon going down to the subcutaneous area, the  whole subcutaneous area over the pretibial region was completely full of  synovial tissue. After removing all that and culturing the area, I noted  that the synovium was literally pouring out of the small opening in the  suprapatellar pouch. I then opened the knee joint and he had an extreme  severe synovitis. I did a thorough synovectomy on him. I inspected the  prosthesis, they both were very tight fitted. There were no signs of any  loosening. The cartilaginous surface of the patella and the remaining bone  looked excellent. There were no gross signs of any infection. This was more  of a necrotic synovial tissue reaction.  I went up into the suprapatellar  pouch, debrided that entire area. Debrided all the synovium that was visible  and I thoroughly water picked out the  knee with the pressure waterpick. I  thoroughly irrigated out the knee and inserted a Hemovac and then closed the  knee in layers in the usual fashion. I injected 30 cc of 0.5% Marcaine with  epinephrine into the knee joint. He was given a 0.5 gm of Vancomycin after  the cultures were taken. He was also given Decadron 8 mg preop because of  problem with nausea.                                                 Ronald A. Gladstone Lighter, M.D.    RAG/MEDQ  D:  12/24/2001  T:  12/24/2001  Job:  [id number]

## 2010-08-24 NOTE — H&P (Signed)
NAME:  [name], [name]                      ACCOUNT NO.:  [id number]   MEDICAL RECORD NO.:  [id number]                   PATIENT TYPE:   LOCATION:                                       FACILITY:  [facility]   PHYSICIAN:  Elby Showers. Paitsel, P.A.               DATE OF BIRTH:  [birth year]   DATE OF ADMISSION:  03/16/2003  DATE OF DISCHARGE:                                HISTORY & PHYSICAL   HISTORY:  The patient has had left knee pain for the past several years.  He  has been having increasing pain over the past few months.  Nonsteroidal anti-  inflammatory medications are no longer helping him.  He had a right  unicondylar knee replacement done in June of 2003 and has elected to proceed  with a left total knee replacement.  The patient will be admitted to [facility] for same.   ALLERGIES:  PENICILLIN.   PRIMARY CARE PHYSICIAN:  Doree Albee, M.D.   CURRENT MEDICATIONS:  1. Cozaar 50 mg daily.  2. Innopranxl 120 mg daily.  3. Diazepam 5 mg b.i.d.  4. Prednisone 2.5 mg daily.  5. Prilosec p.r.n.  6. Oxycodone 10/650 b.i.d.   PAST MEDICAL HISTORY:  1. Alcoholism.  He is a practicing alcoholic.  2. Anxiety.  3. Hypertension.  4. Degenerative arthritis.   PAST SURGICAL HISTORY:  1. Unicondylar knee replacement June 2002.  2. The patient had a synovectomy done postoperative in October 2002.   FAMILY HISTORY:  Heart disease, hypertension.  Mother had uterine cancer.  Father had prostate cancer and arthritis.   SOCIAL HISTORY:  The patient is married.  He does not smoke.  He drinks  approximately one-half to one bottle of wine per day.  His wife will be his  primary caregiver after surgery.   REVIEW OF SYSTEMS:  GENERAL:  Denies weight change, fever, chills, fatigue.  HEENT:  Denies headache, visual changes, tinnitus, hearing loss, sore  throat.  CARDIOVASCULAR:  Denies chest pain, palpitations, shortness of  breath, orthopnea.  PULMONARY:  Denies dyspnea,  wheezing, cough, sputum  production, hemoptysis.  GASTROINTESTINAL:  Denies dysphagia, nausea,  vomiting, hematemesis, or abdominal pain.  GENITOURINARY:  Denies dysuria,  frequency, urgency, hematuria.  ENDOCRINE:  Denies polyuria, polydipsia,  appetite change, heat or cold intolerance.  MUSCULOSKELETAL:  He does have  some pain in his right knee.  He has severe pain in his left knee, worse  with ambulation.  NEUROLOGIC:  Denies dizziness, vertigo, syncope, seizures.  SKIN:  Denies itching, rashes, masses, or moles.   PHYSICAL EXAMINATION:  VITAL SIGNS:  Temperature 98.8, pulse 60,  respirations 18, blood pressure 150/90 right arm sitting.  GENERAL:  A 65 year old male in no acute distress.  HEENT:  PERRL.  EOMs intact.  Pharynx clear.  TMs intact.  NECK:  Supple without masses.  CHEST:  Clear to auscultation bilaterally.  No wheezing, rales, or  rhonchi  noted.  HEART:  Regular rate and rhythm without murmur.  ABDOMEN:  Positive bowel sounds, soft, nontender.  EXTREMITIES:  Decreased range of motion left knee.  Has pain with range of  motion and crepitus.  SKIN:  Warm and dry.   LABORATORIES:  X-ray reveals degenerative arthritis in his left knee.   IMPRESSION:  Degenerative arthritis left knee.   PLAN:  The patient is to be admitted to [facility] for a left  total knee arthroplasty March 16, 2003.                                               Elby Showers. Paitsel, P.A.    LKP/MEDQ  D:  03/15/2003  T:  03/15/2003  Job:  [id number]

## 2010-08-24 NOTE — Op Note (Signed)
[facility]  Patient:    [name], [name] Visit Number: [id number] MRN: [id number]          Service Type: SUR Location: 4W 0272 01 Attending Physician:  Ferrel Logan Dictated by:   Kipp Brood Gladstone Lighter, M.D. Proc. Date: 09/24/01 Admit Date:  09/24/2001                             Operative Report  SURGEON:  Jori Moll A. Gioffre, M.D.  ASSISTANT:  Philips J. Eulas Post, M.D.  PREOPERATIVE DIAGNOSIS:  Degenerative arthritis involving the right knee.  POSTOPERATIVE DIAGNOSIS:  Degenerative arthritis involving the right knee.  OPERATION PERFORMED:  Repicci Unicondylar knee replacement.  SIZES USED:  Femoral component size 54 right, tibial component 6.5 mm thickness size 37 and both components were cemented.  DESCRIPTION OF PROCEDURE:  Under general anesthesia, routine orthopedic prep and draping of the right lower extremity was carried out. The leg was exsanguinated with Esmarch, tourniquet was elevated to 350 mmHg. An incision was made over the median parapatellar region, bleeders identified and cauterized. The incision was carried down to the medial aspect of the knee into the joint. I then did a medial meniscectomy. Following this, the synovium was reflected upon the medial border of the patella and at this particular point we utilized the oscillating saw and removed the spurs in the medial portion of the patella with the oscillating saw. Once this was done, the knee then was flexed. I then inserted my pins into the femur and tibia in the usual fashion and then distracted the joint. I then utilized the bur and burred out the medial tibial plateau down approximately 5 mm. We went through the trials and felt that the 37 mm, 6.5 mm trial gave [place] the most accurate fit. Note we made sure that we had a nice even cut and we went far posterior with our rim in order to get an appropriate fit. Once the tibial plateau was prepared medially, we then  burred down the articular surface of the femur in the usual fashion. We then utilized our gouge, marked our femur with the gouge. We then inserted our 54 mm trial. The gouge that we used was for a 54 mm and we felt was the most appropriate. We then made sure we had the appropriate alignment of the tibial alignment guide and we then marked our groove with the marking pen. We made our initial drill hole into the distal femur. This was all done after the distraction devices were removed. We then cut our fissure with a fissure bur for the femoral component. Once this was done, we inserted our trials, we utilized a 54 mm trial with the 37 x 6.5 mm trial tibial insert and we had excellent function. There was no impingement. All through range of motion, both of the prostheses were very stable. We then removed those, thoroughly water picked out the knee, irrigated the knee and I injected the posterior capsule with 20 cc of 0.25% Marcaine with epinephrine. Once this was done, we dried the area out and then cemented both components in simultaneously. We then removed loose pieces of cement, we held the knee in extension for approximately 8-10 seconds and then flexed the knee and removed the remaining loose pieces of cement. Note we did have packing posteriorly with a sponge and made sure no cement migrated posteriorly. We then removed the sponge and made sure there were no other  loose pieces of cement and there were not. We thoroughly water picked and irrigated the knee out. We took the knee through various ranges of motion, the knee was extremely stable and our alignment was excellent. We then inserted the Hemovac drain and closed the knee in layers in the usual fashion. We then utilized approximately 50 cc or more of the 0.25% Marcaine with epinephrine as an anesthetic. He was given vancomycin 500 mg preop. Note he was also given Toradol IV during surgery. Dictated by:   Kipp Brood Gladstone Lighter,  M.D. Attending Physician:  Ferrel Logan DD:  09/24/01 TD:  09/25/01 Job: [id number]

## 2010-08-24 NOTE — Cardiovascular Report (Signed)
[name], [name]            ACCOUNT NO.:  [id number]   MEDICAL RECORD NO.:  [id number]          PATIENT TYPE:  OIB   LOCATION:  2899                         FACILITY:  [facility]   PHYSICIAN:  Kaylyn Lim., M.D.DATE OF BIRTH:  1945/06/11   DATE OF PROCEDURE:  10/15/2004  DATE OF DISCHARGE:                              CARDIAC CATHETERIZATION   DESCRIPTION OF PROCEDURE:  The patient was brought to the cardiac  catheterization laboratory after appropriate informed consent. He was  prepped and draped in sterile fashion. Lidocaine 1% 10 mL was used for local  anesthesia. A 6 French sheath was placed in the right femoral artery without  difficulty. Coronary angiography, LV angiography and common right femoral  angiography were then performed.   FINDINGS:  1.  Left main: Moderate size with no significant disease.  2.  LAD: Mild luminal irregularities.  3.  D1: Small vessel. Mild luminal irregularities.  4.  LCX: Nondominant. Mild luminal irregularities.  5.  OM1: Moderate size with a 95% mid stenosis.  6.  RCA: Dominant with mild luminal irregularities.  7.  LV: EF 50-55% and LVEDP was 13 mmHg.  8.  The common right femoral weight is torturous with mild luminal      irregularities.  9.  Arteriotomy site to low at the bifurcation for Angio-Seal.   IMPRESSION:  1.  Single-vessel coronary artery disease.   PLAN:  Elective PCI of the OM and aggressive medical therapy.       TWK/MEDQ  D:  10/15/2004  T:  10/15/2004  Job:  [id number]   cc:   Darlin Coco, M.D.  [address], [address] [place]  [place] [address]  Fax: [phone]

## 2010-08-24 NOTE — H&P (Signed)
NAME:  [name], [name]            ACCOUNT NO.:  [id number]   MEDICAL RECORD NO.:  [id number]          PATIENT TYPE:  OIB   LOCATION:                               FACILITY:  [facility]   PHYSICIAN:  Kaylyn Lim., M.D.DATE OF BIRTH:  01-Nov-1945   DATE OF ADMISSION:  10/15/2004  DATE OF DISCHARGE:                                HISTORY & PHYSICAL   HISTORY OF PRESENT ILLNESS:  The patient is a very pleasant 65 year old  white male with past medical history of hypertension, gastroesophageal  reflux disease, hemochromatosis, and strong family history of heart disease  who presented for evaluation of increasing exertional chest pain. The  patient reports a substernal chest pressure that tends to occur on starting  his exercise. It has worsened over the last 2 months. He had a stress  Cardiolite done on October 11, 2004 where he exercised via Bruce protocol for 10  minutes and 30 seconds, achieving a peak heart rate of 163 beats per minute,  corresponding to 90% of age predicted maximum. His perfusion images showed a  primarily fixed inferior defect with normal LV systolic function. He had no  significant ST or T-wave changes. Because of his strong symptoms and strong  family history, he is now referred for cardiac catheterization. Review of  systems are otherwise negative. He has no rest pain.   CURRENT MEDICATIONS:  His current medications are:  1.  Cozaar 50 mg daily.  2.  Celebrex 200 mg b.i.d.  3.  Prednisone 5 mg daily.  4.  Protonix 40 mg daily.  5.  Aspirin 81 mg daily.  6.  Colchicine 0.6 mg 3 times a week.  7.  Omega 3 once daily.  8.  Flaxseed oil once daily.  9.  B complex vitamin once daily.  10. Glucosamine chondroitin once daily.  11. Garlic once daily.   ALLERGIES:  PENICILLIN causing anaphylaxis.   FAMILY HISTORY:  Positive for coronary disease, hypertension and CHF as well  as dyslipidemia in a brother.   SOCIAL HISTORY:  He owns his own company, currently  looking to expand. He  does exercise via walking and water aerobics. He does not smoke or drink  alcohol. He does drink two caffeinated beverage daily.   PAST SURGICAL HISTORY:  1.  Left total knee replacement and right knee operation x2.  2.  He has also had past history of right ankle surgery.   PHYSICAL EXAMINATION:  VITAL SIGNS:  Exam today, his weight is 165 pounds,  blood pressure 122/80, heart rate is 72 and regular.  GENERAL:  In general, he is a very pleasant middle-aged white male, alert  and oriented x4 in no acute distress.  NECK:  Supple. No lymphadenopathy, 2+ carotids. No JVD, no bruits.  LUNGS:  Lungs were clear.  HEART:  Regular with a normal S1-S2. No murmurs, gallops or rubs.  ABDOMEN:  Soft, nontender, nondistended.  EXTREMITIES:  Warm with 2+ pulses. No edema.   LABORATORY DATA:  Labs were pending at time of dictation. His stress test  was reviewed with him at length. EKG shows normal sinus  rhythm, normal axis,  normal intervals with no significant ST or T-wave changes noted.   IMPRESSION:  Indeterminate stress test via perfusion imaging.   PLAN:  I discussed risks and benefits of invasive workup. The patient would  like to proceed with cardiac catheterization plus/minus intervention if  needed. He will have routine blood work including CBC, BMP, and coags done  today. Chest x-ray was performed showing no acute cardiac cardiopulmonary  disease.       TWK/MEDQ  D:  10/12/2004  T:  10/12/2004  Job:  [id number]

## 2010-08-24 NOTE — Discharge Summary (Signed)
NAME:  [name], [name]                      ACCOUNT NO.:  [id number]   MEDICAL RECORD NO.:  [id number]                   PATIENT TYPE:  INP   LOCATION:  0474                                 FACILITY:  [facility]   PHYSICIAN:  Kipp Brood. Gladstone Lighter, M.D.             DATE OF BIRTH:  06-17-1945   DATE OF ADMISSION:  03/16/2003  DATE OF DISCHARGE:  03/21/2003                                 DISCHARGE SUMMARY   ADMISSION DIAGNOSES:  1. Degenerative arthritis, left knee.  2. Alcoholism.  3. Anxiety.  4. Hypertension.   DISCHARGE DIAGNOSES:  1. Degenerative arthritis, left knee, status post left total knee     arthroplasty.  2. Alcoholism.  3. Anxiety.  4. Hypertension.   PROCEDURE:  The patient was taken to the operating room on March 16, 2003  to undergo a left total knee arthroplasty.   SURGEON:  Latanya Maudlin, M.D.   ASSISTANT:  Mardene Sayer, P.A.C.   ANESTHESIA:  General.   DRAINS:  Hemovac drain was placed at the time of surgery.   CONSULTATIONS:  1. Physical therapy.  2. Occupational therapy.   HISTORY OF PRESENT ILLNESS:  This is a 65 year old male who has had left  knee pain for the past several years. He has been having increasing pain  over the past several months and steroidal anti-inflammatory medications are  no longer relieving his symptoms. The patient had a right unicondylar knee  replacement done in June of 2003 and has elected to proceed with a left  total knee replacement. The patient was admitted to [facility] for same.   LABORATORY DATA:  Preadmission CBC, WBC 5.1, RBC 3.94 and slightly low.  Hemoglobin 13.6. Hematocrit 39.9. Platelet count 236,000. Pre-admission PT  12.9. INR 1.0. PTT 35. Pre-admission chemistry, sodium slightly low at 133.  Potassium 5.1, chloride 103, CO2 27, glucose 98, BUN 7, creatinine 0.8,  calcium 8.8, total protein 7.0. Pre-admission urinalysis normal. The  patient's blood type is A negative. Negative  antibody screen. Hemoglobin and  hematocrit was followed throughout hospitalization. The patient had a  postoperative decrease in his hemoglobin to 9.3 but that stabilized prior to  discharge and without requiring a blood transfusion.   Preoperative chest x-ray, elevation of his right hemi-diaphragm. No active  disease. Preoperative x-ray of his left knee revealed severe medial  compartmental degenerative changes. Postoperative x-ray of his left knee  revealed normal alignment of his total knee prosthesis.   Unable to locate his preoperative EKG on his medical record.   HOSPITAL COURSE:  The patient was admitted to [facility]  and taken to the operating room. He underwent the above stated procedure  without complications. The patient tolerated the procedure well and was  allowed to return to the recovery room and then to the orthopedic floor to  continue his postoperative care. Hemovac drain was placed at the time of  surgery. That was discontinued  on postoperative day 1. The patient was  placed on PC analgesia's for pain control. The patient's pain was well  controlled and he was weaned over to oral analgesics and his PCA was  discontinued on postoperative day 3. The patient's hemoglobin and hematocrit  was followed throughout his hospitalization. The patient had a postoperative  decrease in his hemoglobin to 9.3 but that stabilized prior to discharge and  he did not require blood transfusion. Physical therapy was consulted for  gait training ambulation. The patient did well and progressed very well and  was able to ambulate greater than 100 feet by postoperative day 5. The  patient was discharged home on postoperative day 5.   DISPOSITION:  Discharge date was March 21, 2003.   DISCHARGE MEDICATIONS:  1. Percocet 10/650 1 to 2 every 6 hours as needed for pain.  2. Valium 5 mg 1 every 12 hours as needed for anxiety.  3. Ambien 10 mg 1 at bedtime.  4. Coumadin 5  mg daily.  5. Robaxin 500 mg 1 every 6 hours as needed for muscle spasm.   DIET:  As tolerated.   ACTIVITY:  Total knee precautions. Full weight bearing with walker.   WOUND CARE:  Keep wound dry and clean. Daily dressing changes. Arville Go for  home PT and will check PT and INR to monitor Coumadin.   FOLLOW UP:  The patient is scheduled to followup with Dr. Gladstone Lighter 2 weeks  from the date of surgery. He will call the office for an appointment.   CONDITION ON DISCHARGE:  Improved.     Elby Showers. Paitsel, P.A.                     Ronald A. Gladstone Lighter, M.D.    Delsa Bern  D:  04/14/2003  T:  04/14/2003  Job:  [id number]

## 2010-08-24 NOTE — Discharge Summary (Signed)
[name], [name]            ACCOUNT NO.:  [id number]   MEDICAL RECORD NO.:  [id number]          PATIENT TYPE:  INP   LOCATION:  5010                         FACILITY:  [facility]   PHYSICIAN:  Anderson Malta, M.D.    DATE OF BIRTH:  03/14/1946   DATE OF ADMISSION:  08/12/2006  DATE OF DISCHARGE:  08/14/2006                               DISCHARGE SUMMARY   DISCHARGE DIAGNOSIS:  Left shoulder arthritis.   SECONDARY DIAGNOSIS:  1. Hemochromatosis.  2. History of bilateral knee replacements.  3. History of benign growth removed from the back January 2008.  4. History of angioplasty with stent in the circumflex July 2006.   OPERATION/PROCEDURE:  Left shoulder hemiarthroplasty performed Aug 12, 2006.   HOSPITAL COURSE:  [name] is a 65 year old patient with left  shoulder arthritis secondary to hemochromatosis.  He underwent left  shoulder hemiarthroplasty Aug 12, 2006.  The patient tolerated the  procedure well without immediate complication.  He was mobilized with  pendulums with physical therapy on postop day #1.  His left hand was  perfused and motor sensory function was intact at that time.  His  hemoglobin was 10 at the time of discharge on postop day #2.  He was  mobilizing well in the sling.  Dressing was changed at that time and  incision was intact.  The patient had an otherwise unremarkable recovery  and was discharged home in good condition on Aug 14, 2006.   DISCHARGE MEDICATIONS:  1. Percocet 10/325 one p.o. every three to four hours p.r.n. pain.  2. Robaxin 500 mg p.o. every six hours p.r.n. spasm  3. Cozaar 50 mg p.o. every day.  4. Hydrochlorothiazide 12.5 mg p.o. every day.  5. Prednisone 2.5 mg p.o. every day.  6. Ambien CR 12.5 mg p.o. every day.  7. Protonix 40 mg one p.o. every day.  8. Cultrazine 0.6 mg p.o. every day.   He will follow up with me in 7 days for suture removal.  He will stay in  the sling except for pendulum exercises once a  day.      Anderson Malta, M.D.  Electronically Signed     GSD/MEDQ  D:  09/17/2006  T:  09/18/2006  Job:  [id number]

## 2010-08-24 NOTE — Op Note (Signed)
NAME:  [name], [name]                      ACCOUNT NO.:  [id number]   MEDICAL RECORD NO.:  [id number]                   PATIENT TYPE:  INP   LOCATION:  0474                                 FACILITY:  [facility]   PHYSICIAN:  Kipp Brood. Gladstone Lighter, M.D.             DATE OF BIRTH:  1945-10-24   DATE OF PROCEDURE:  03/16/2003  DATE OF DISCHARGE:                                 OPERATIVE REPORT   SURGEON:  Kipp Brood. Gladstone Lighter, M.D.   ASSISTANT:  Elby Showers. Paitsel, P.A.   PREOPERATIVE DIAGNOSIS:  Severe arthritis involving the left knee.   POSTOPERATIVE DIAGNOSIS:  Severe arthritis involving the knee.   OPERATION:  Left total knee arthroplasty utilizing the [employer] systems.  All three components were cemented.  I used a 26 patella, a size 7 left  posterior cruciate sacrificing femoral component and a size 9 tibial tray.  The insert was a 10 mm thickness insert.  VANCOMYCIN WAS USED IN THE CEMENT.   DESCRIPTION OF PROCEDURE:  Under general anesthesia, routine orthopedic prep  and draping of the left lower extremity was carried out.  The leg was  exsanguinated with esmarch, tourniquet was elevated at 350 mmHg.  Anterior  approach to the knee was carried out, two flaps were created and sutured in  place.  I then carried out a median parapatellar incision. The patella was  reflected laterally, the knee was flexed and I then did medial and lateral  meniscectomies and excised the anterior and posterior cruciate ligaments.  I  also did a posterior medial release because of his varus.  At this time, an  initial drill hole was made in the intercondylar notch.  A #1 jig was  inserted, we removed 10 mm thickness of the distal femur.  Following this,  the #2 jig was inserted for a size 7 left posterior cruciate sacrificing  prosthesis.  We carried out the anterior, posterior and chamfering cuts.  At  this time, we then prepared the tibia.  We removed 4 mm thickness off the  medial side of the tibia at  this time utilizing intramedullary guides.  After this was done, we prepared the patella, removed 10 mm thickness off  the patella for a recessed patella and three drill holes were made in the  patella.  Prior to doing this, we then made our groove cut in the notch and  for the patella groove.  We then prepared our patella as I mentioned above.  Prior to flexing the knee, we then inserted our trials and went through the  trial and selected a size 9 tibial component, 10 mm thickness insert and a  size 7 left posterior cruciate sacrificing femoral component.  Once we went  through that we then cut our keel cut in the usual fashion. We thoroughly  irrigated out the knee, dried the knee out and cemented all three components  in simultaneously.  We removed all loose pieces  of cement.  As I said we  utilized a permanent 10 mm thickness tibial insert, flex type.  We  thoroughly irrigated out the knee again, inserted the drain and closed the  knee in layers in the usual fashion.  The skin was closed with metal staples  and a sterile Neosporin dressing was applied.  The patient left the  operating room in satisfactory condition.  She had 1 g of vancomycin IV  preop.                                               Ronald A. Gladstone Lighter, M.D.    RAG/MEDQ  D:  03/16/2003  T:  03/16/2003  Job:  [id number]

## 2010-08-24 NOTE — Discharge Summary (Signed)
[name], [name]            ACCOUNT NO.:  [id number]   MEDICAL RECORD NO.:  [id number]          PATIENT TYPE:  OIB   LOCATION:  6532                         FACILITY:  [facility]   PHYSICIAN:  Kaylyn Lim., M.D.DATE OF BIRTH:  April 19, 1945   DATE OF ADMISSION:  10/15/2004  DATE OF DISCHARGE:  10/17/2004                                 DISCHARGE SUMMARY   DISCHARGE DIAGNOSES:  1.  Single-vessel coronary artery disease with obstructive proximal obtuse      marginal status post PCI of the obtuse marginal.  2.  Dyslipidemia.  3.  History of hemochromocytosis.  4.  Osteoarthritis.   HISTORY OF PRESENT ILLNESS:  The patient is very pleasant 65 year old white  male who had an indeterminate stress test. He was admitted on October 15, 2004  for evaluation of ischemic heart disease.   HOSPITAL COURSE:  The patient underwent cardiac catheterization on October 15, 2004 showing a critical 95% proximal OM obstruction.  He then underwent  successful PCI and stenting of his proximal OM. 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 The patient was held in the  hospital an additional 24 hours. He did have a mild bump in his enzymes up  to a troponin of 3.1. He had no further chest pain following his procedure.  He has now been up and ambulatory for the last 36 hours and tolerating p.o.  very well. He will be discharged on the following medications:  1.  Plavix 75 mg daily.  2.  Celebrex 200 mg b.i.d.  3.  Cozaar 50 mg daily.  4.  Prednisone 5 mg daily.  5.  Protonix 40 mg daily.  6.  Aspirin 81 mg daily.  7.  Colchicine 0.6 mg three times per week.  8.  Vytorin 10/40 mg daily, (new medication for him).  9.  Nitroglycerin p.r.n.  10. Omega III, flax seed oil, B complex vitamins, glucosamine chondroitin      and garlic all as before.   DISCHARGE INSTRUCTIONS:  The patient was instructed to do no  strenuous  activity or heavy lifting for the next week. He may return to his normal  activities in one week. Diet should be low cholesterol, low fat. He will  call the office at [phone] with any questions or concerns. Otherwise, he  will follow up with Dr. Warren Danes or myself in two weeks. He will need a  CMP and fasting lipids in six weeks.       TWK/MEDQ  D:  10/17/2004  T:  10/17/2004  Job:  [id number]   cc:   Doree Albee, M.D.  [address], [address] [place] [address]  Fax: [phone]   Darlin Coco, M.D.  [address] [place]  [place] [address]  Fax: [phone]

## 2010-08-24 NOTE — Cardiovascular Report (Signed)
NAMEKEMONTE, ULLMAN NO.:  [id number]   MEDICAL RECORD NO.:  [id number]          PATIENT TYPE:  OIB   LOCATION:  2899                         FACILITY:  [facility]   PHYSICIAN:  Peter M. Martinique, M.D.  DATE OF BIRTH:  Aug 29, 1945   DATE OF PROCEDURE:  10/15/2004  DATE OF DISCHARGE:                              CARDIAC CATHETERIZATION   INDICATIONS FOR PROCEDURE:  A 65 year old white male presents with new onset  of angina. He had abnormal Cardiolite study. Diagnostic cardiac  catheterization performed by Dr. Verlon Setting demonstrated single vessel  obstructive coronary disease with a 95% stenosis in the proximal first  obtuse marginal vessel. Following his diagnostic procedure, stenting of the  marginal vessel was recommended. The patient had a 6-French access in the  right femoral artery. Additional equipment included 6-French FL4 guide, .014  high-torque floppy extra support wire, a dock wire, a 2.5 x 12 mm Maverick  balloon, a 2.5 x 15 mm Maverick over-the-wire balloon, 2.5 x 20 mm Taxus  stents x2.   MEDICATIONS:  1.  Integrilin double bolus 180 mcg/kg followed by continuous infusion of 2      mcg/kg per minute.  2.  Nitroglycerin drip at 10 mcg per minute.  3.  Nitroglycerin 200 mcg intracoronary x1,  4.  Verapamil 200 mcg intracoronary x 5.  5.  Fentanyl 25 mcg IV x2.  6.  Plavix 600 mg p.o.  7.  Heparin 3800 units IV.   Total contrast including the diagnostic procedure was 240 cc of Omnipaque.  The patient remained hemodynamically stable throughout.   PROCEDURE:  After initial guide shots were obtained, the patient was given  intracoronary nitroglycerin. We proceeded to cross the lesion in the  marginal vessel with a wire. This was very difficult due to tortuosity of  the circumflex and marginal takeoff. We were unable to cross the lesion with  the wire alone as it tended to prolapse. We brought the balloon down to the  wire tip and were able to cross at this  point with the wire. However, there  was significant standing of the dye in the proximal to mid marginal vessel.  Even prior to balloon inflation, the marginal vessel was occluded. There was  evidence of extensive spiral dissection down the marginal vessel. We also  had no flow down the distal circumflex. We performed a series of balloon  inflations using a 2.5 mm x 12 mm Maverick balloon further distal in the  marginal vessel up to 4 atmospheres. The patient was given a series of  intracoronary verapamil injections. This did result in restoration of flow  down the distal circumflex, but the marginal vessel remained occluded. At  this point we used a dock wire to withdraw our initial balloon and used an  over-the-wire balloon to place in the distal marginal vessel. Injections  through the wire port of this balloon catheter confirmed that we were within  the true lumen and also defined the extent of the dissection. The distal  vessel appeared to be intact. After giving a series of verapamil injections  we were able to restore flow  down the circumflex marginal. We could see the  extent of the dissection from the proximal to mid vessel. This was then  stented initially in the mid vessel using a 2.5 x 15 mm Taxus stent and then  overlapping this more proximally with an additional in an overlapping  fashion. Both stents were deployed at 9 atmospheres and then postdilated to  12 atmospheres. This yielded excellent angiographic result with 0% residual  stenosis. In appeared to be well tacked up at this point. There was TIMI  grade 3 flow. The patient did have moderate chest pain at this point but was  hemodynamically stable and had no significant ECG changes.   FINAL INTERPRETATION:  First obtuse marginal branch of the left circumflex  coronary. This procedure was complicated by acute spiral dissection  resulting in acute vessel closure. It was successfully reopened.       PMJ/MEDQ  D:   10/15/2004  T:  10/15/2004  Job:  [id number]

## 2010-08-24 NOTE — Op Note (Signed)
NAME:  TAVARIS, EUDY            ACCOUNT NO.:  [id number]   MEDICAL RECORD NO.:  [id number]          PATIENT TYPE:  INP   LOCATION:  5010                         FACILITY:  [facility]   PHYSICIAN:  Anderson Malta, M.D.    DATE OF BIRTH:  1945/10/30   DATE OF PROCEDURE:  08/12/2006  DATE OF DISCHARGE:                               OPERATIVE REPORT   PREOPERATIVE DIAGNOSIS:  Left shoulder arthritis.   POSTOPERATIVE DIAGNOSIS:  Left shoulder arthritis.   PROCEDURE:  Left shoulder hemiarthroplasty.   ATTENDING SURGEON:  Anderson Malta, M.D.   ASSISTANT:  Emogene Morgan. Laurina Bustle, M.D.   ANESTHESIA:  General endotracheal.   ESTIMATED BLOOD LOSS:  1 mL.   DRAINS:  Hemovac x1.   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  He  presents now for left shoulder hemiarthroplasty after explanation of  risks and benefits.   PROCEDURE IN DETAIL:  The patient was brought to the operating room,  where general endotracheal anesthesia was induced.  Preoperative  antibiotics were administered.  The patient was placed in the beach-  chair position with his head in neutral position.  The left arm,  shoulder and hand were prepped with DuraPrep solution and draped in a  sterile manner.  Charlie Pitter was used to cover the operative field.  An  anterior approach to the shoulder was utilized.  An incision was made  from just lateral to the coracoid process down to the axillary fold,  slightly diagonally.  The skin and subcutaneous tissue were sharply  divided.  The fascia was identified and the deltopectoral interval was  developed.  The cephalic vein was mobilized laterally with the deltoid.  A self-retaining retractor was placed.  Significant scar tissue was  present from prior arthroscopic procedures.  The deltoid was mobilized  off of the rotator cuff.  The bicipital groove was identified.  The  subscapularis tendon was then  detached from the lesser tuberosity and  tagged with four #2 FiberWire sutures.  The capsule was carefully  detached from the undersurface of the humeral neck.  The axillary nerve  was palpated and was protected at all times during the case.  The head  was dislocated.  Intramedullary reaming was then performed approximately  1 cm posterior to the bicipital groove, just lateral the articular  margin surface.  The canal was reamed.  The head cut was then made in  approximately 20 degrees of retroversion in accordance with the  intramedullary guide.  The canal was then broached until a good press-  fit was achieved.  A Biomet 15 stem was then placed with a corresponding  head size.  The top of the head was approximately 6 to 8 mm above the  tuberosity surface.  The patient with the trial prosthesis in place had  excellent stability with external rotation.  He could come across his  body, put his hand close to his head, and he had about 50% inferior  translation with the current head size.  The trial components were  removed.  The cut bony  surfaces were irrigated.  The true component was  placed in the same amount of retroversion.  The same stability  perimeters were maintained, with hand to stomach, hand to head, as well  as with forward flexion of about 150 degrees.  At this time, after  placement of the true component, the subscap was secured back to the  lesser tuberosity through drill holes made by a Protac.  The rotator  interval was closed with the arm in 30 degrees of external rotation.  The deltopectoral interval was reapproximated using 0 Vicryl sutures.  The skin was closed using interrupted, inverted 2-0 Vicryl suture and a  running 3-0 pullout Prolene.  A bulky dressing was placed.  A Hemovac  drain was also placed.  The patient tolerated the procedure well,  without immediate complications.      Anderson Malta, M.D.  Electronically Signed     GSD/MEDQ  D:  08/13/2006  T:   08/13/2006  Job:  [id number]

## 2010-08-24 NOTE — H&P (Signed)
[facility]  Patient:    [name], BLANFORD Visit Number: [id number] MRN: [id number]          Service Type: Attending:  Kipp Brood. Gladstone Lighter, M.D. Dictated by:   Jenetta Loges, P.A.   CC:         Hurshel Party, M.D.  Eston Esters, M.D.   History and Physical  DATE OF BIRTH:  1946-04-07  CHIEF COMPLAINT:  Bilateral knee pain, right greater than left.  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  He has had unit compartment involvement bilaterally.  He has failed conservative measures including Hyalgan injections, cortisone injections, as well as arthroscopic debridement.  Due to his unit compartmental involvement, it was felt that he was an appropriate candidate for a unit compartment knee replacement, i.e., the Repicci knee replacement.  Dr. Gladstone Lighter had taken all of his x-rays and presented the case to Dr. Theodoro Kalata, who felt he was the ideal candidate.  With this in mind and the patients continued pain and dysfunction, he wishes to proceed.  He is at the point where he is unable to participate in ADLs or perform the activities that he wants to perform.  At this point the risks and  PAST MEDICAL HISTORY: 1. Hemochromatosis, he gets monthly phlebotomy. 2. Osteoarthritis, as above.  PAST SURGICAL HISTORY: 1. Bilateral foot surgery. 2. Bilateral knee arthroscopes. 3. Bilateral ankle surgery.  All by Dr. Gladstone Lighter.  I do not have the records of the exact nature of the surgical procedures today.  MEDICATIONS:  Vioxx 25 mg q.d.  ALLERGIES:  PENICILLIN causes anaphylactic reaction.  SOCIAL HISTORY:  Drinks occasional wine.  No tobacco products.  He is married. Lives in a two-story home.  He will have help postoperatively, but he does have the option of having a downstairs bedroom.  FAMILY HISTORY:  Significant for heart disease, prostate cancer,  and hypertension.  REVIEW OF SYSTEMS:  The patient denies any recent fevers, chills, night sweats.  No bleeding tendencies.  CNS:  No blurred or double vision.  No headaches, seizures, paralysis.  RESPIRATORY:  No shortness of breath, productive cough, hemoptysis.   CARDIOVASCULAR:  No chest pain, angina, orthopnea.  GASTROINTESTINAL:  No nausea, vomiting, constipation, melena, bloody stools.  He does have a history of nausea and vomiting with anesthesia, but he still wishes to proceed and not attempt a spinal due to degenerative joint disease of his back.  GENITOURINARY:  No dysuria, no hematuria. MUSCULOSKELETAL:  As pertinent to present illness.  PHYSICAL EXAMINATION:  GENERAL:  The patient is well-developed, well-nourished 65 year old male.  VITAL SIGNS:  Blood pressure is elevated today on exam at 162/102 bilaterally. A second reading was 160/100.  HEENT:  Normocephalic.  Extraocular motions intact.  NECK:  Supple.  No lymphadenopathy.  No carotid bruits appreciated.  CHEST:  Clear to auscultation bilaterally.  No rales, no rhonchi.  HEART:  Regular rate and rhythm.  No murmurs, gallops, rubs, heaves, or thrills.  ABDOMEN:  Positive bowel sounds.  Soft and nontender.  EXTREMITIES:  Bilateral lower extremities have medial joint line pain on exam. Neurovascularly intact.  No pitting edema.  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  PLAN:  Right knee unicondylar Repicci arthroplasty to be followed by the left at some point in the near future.  The patient was seen by his primary physician, Dr. Anastasio Champion; I do not have that office visit  with me today.  However, he was cleared for surgery.  He is scheduled for phlebotomy preoperatively.  Dictated by:   Jenetta Loges, P.A.  Attending:  Kipp Brood. Gladstone Lighter, M.D. DD:  09/17/01 TD:  09/19/01 Job: [id number]

## 2010-08-29 ENCOUNTER — Encounter: Payer: Self-pay | Admitting: Internal Medicine

## 2010-08-30 ENCOUNTER — Ambulatory Visit (INDEPENDENT_AMBULATORY_CARE_PROVIDER_SITE_OTHER): Payer: 59 | Admitting: Internal Medicine

## 2010-08-30 ENCOUNTER — Ambulatory Visit: Payer: 59 | Admitting: Internal Medicine

## 2010-08-30 VITALS — BP 110/64 | HR 86 | Temp 98.2°F | Wt 171.0 lb

## 2010-08-30 DIAGNOSIS — Z136 Encounter for screening for cardiovascular disorders: Secondary | ICD-10-CM

## 2010-08-30 MED ORDER — FUROSEMIDE 20 MG PO TABS: 20.0000 mg | ORAL_TABLET | Freq: Every day | ORAL | Status: DC

## 2010-08-30 MED ORDER — TRAZODONE HCL 50 MG PO TABS: 50.0000 mg | ORAL_TABLET | Freq: Every day | ORAL | Status: DC

## 2010-08-30 NOTE — Progress Notes (Signed)
  Subjective:    Patient ID: [name], male    DOB: 1945-09-24, 65 y.o.   MRN: [id number]  HPI [name] presents for hospital follow-up. He was at [facility] May 1-8, 2012 for new onset atrial fibrillation with RVR. He had a full evaluation - D/C summary reviewed. By [name] - EF 64% without evidence of ischemia, TEE- negative for clot and with normal valve function. He did under-go successful Barry. He was discharged on diltiazem 241m, daily, pradaxa for anti-coagulation until seen in follow-up by cardiology - scheduled for June 1 with Dr. NJohnsie Cancel  Patient had had a persistent bronchitis and was to finish a course of prednisone at the time of discharge.  He was evaluated for mild hyperglycemia during his hospital stay: A1C 6.1%. A prudent diet was recommended.   In the interval he has been feeling much better. His heart rate has been controlled. His breathing has been much better, although he still have some mucus production. He is very energetic. He has been having trouble sleeping and did try trazodone with good results.  Reviewed all meds: stop Vit E, ambien, advair. See list now updated. Discussed dosing of fluoxetine which needs to be taken daily and is not an anxiety rescue medication.    PMH, FamHx and SocHx reviewed for any changes and relevance.    Review of Systems Review of Systems  Constitutional:  Negative for fever, chills, activity change and unexpected weight change.  HENT:  Negative for hearing loss, ear pain, congestion, neck stiffness and postnasal drip.   Eyes: Negative for pain, discharge and visual disturbance.  Respiratory: Negative for chest tightness and wheezing.   Cardiovascular: Negative for chest pain and palpitations.       [No decreased exercise tolerance Gastrointestinal: [No change in bowel habit. No bloating or gas. No reflux or indigestion Genitourinary: Negative for urgency, frequency, flank pain and difficulty urinating.  Musculoskeletal:  Negative for myalgias, back pain, arthralgias and gait problem.  Neurological: Negative for dizziness, tremors, weakness and headaches.  Hematological: Negative for adenopathy.  Psychiatric/Behavioral: Negative for behavioral problems and dysphoric mood.       Objective:   Physical Exam Vitals noted Gen'l - WNWD WM who looks great HEENT - normal  Chest - clear to A&P Cor - Regular rate.       Assessment & Plan:  1. A fib - holding sinus rhythm after [name] Tolerating meds well.  Plan - keep appt with Dr. NJohnsie Cancel          Dr. NJohnsie Cancelwill determine whether to continue pradaxa, whether to continue losartan with such good control of BP            2. Respiratory - resolved  3. Anxiety - continue fluoxetine with alprazolam for break-through anxiety.

## 2010-08-30 NOTE — Patient Instructions (Signed)
e4

## 2010-09-07 ENCOUNTER — Encounter: Payer: Self-pay | Admitting: Cardiovascular Disease

## 2010-09-07 ENCOUNTER — Ambulatory Visit (INDEPENDENT_AMBULATORY_CARE_PROVIDER_SITE_OTHER): Payer: 59 | Admitting: Cardiovascular Disease

## 2010-09-07 DIAGNOSIS — I4891 Unspecified atrial fibrillation: Secondary | ICD-10-CM

## 2010-09-07 DIAGNOSIS — E785 Hyperlipidemia, unspecified: Secondary | ICD-10-CM

## 2010-09-07 DIAGNOSIS — I10 Essential (primary) hypertension: Secondary | ICD-10-CM

## 2010-09-07 NOTE — Patient Instructions (Signed)
Your physician recommends that you schedule a follow-up appointment in: [facility]  Your physician has recommended you make the following change in your medication: STOP PRADAXA ON June 8 ,2012

## 2010-09-07 NOTE — Assessment & Plan Note (Signed)
Cholesterol is at goal.  Continue current dose of statin and diet Rx.  No myalgias or side effects.  F/U  LFT's in 6 months. Lab Results  Component Value Date   LDLCALC  Value: 48        Total Cholesterol/HDL:CHD Risk Coronary Heart Disease Risk Table                     Men   Women  1/2 Average Risk   3.4   3.3  Average Risk       5.0   4.4  2 X Average Risk   9.6   7.1  3 X Average Risk  23.4   11.0        Use the calculated Patient Ratio above and the CHD Risk Table to determine the patient's CHD Risk.        ATP III CLASSIFICATION (LDL):  <100     mg/dL   Optimal  100-129  mg/dL   Near or Above                    Optimal  130-159  mg/dL   Borderline  160-189  mg/dL   High  >190     mg/dL   Very High 08/08/2010

## 2010-09-07 NOTE — Assessment & Plan Note (Signed)
Maint NSR  Stop Pradexa on June 8th.

## 2010-09-07 NOTE — Assessment & Plan Note (Signed)
Well controlled.  Continue current medications and low sodium Dash type diet.    
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
## 2010-09-12 NOTE — Discharge Summary (Signed)
[name], WORD            ACCOUNT NO.:  [id number]  MEDICAL RECORD NO.:  [id number]           PATIENT TYPE:  I  LOCATION:  2002                         FACILITY:  [facility]  PHYSICIAN:  Heinz Knuckles. Daimion Adamcik, MD  DATE OF BIRTH:  1945-04-27  DATE OF ADMISSION:  08/07/2010 DATE OF DISCHARGE:  08/14/2010                              DISCHARGE SUMMARY   ADMITTING DIAGNOSES: 1. New onset atrial fibrillation with rapid ventricular response. 2. Pulmonary, the patient with asthmatic bronchitis. 3. Hyperglycemia.  DISCHARGE DIAGNOSES: 1. New onset atrial fibrillation with rapid ventricular response. 2. Pulmonary, the patient with asthmatic bronchitis. 3. Hyperglycemia.  CONSULTANTS:  Minus Breeding, MD, [facility].  PROCEDURES: 1. Two-view chest x-ray on Aug 08, 2010, which showed chronic volume     loss of the right lung.  No pulmonary edema. 2. Nuclear myocardial study, which showed no specific features to     suggest myocardial ischemia.  Ejection fraction was 64%, mild     decreased motion involving the septal wall. 3. Transesophageal echocardiogram, which revealed no clots or     abnormality. 4. Direct current cardioversion with 200 joules converting the patient     to normal sinus rhythm, which he was out for the past 24 hours.  HISTORY OF PRESENT ILLNESS:  [name] was seen in the office on 24th for probable bronchitis with shortness of breath, wheezing, and mucus production.  He was treated with a Z-Pak.  He was to start prednisone burst and taper, which he was late in starting.  He presented on the day of admission because of increasing shortness of breath, continued mucus production, wheezing, and a sense of racing heart.  He also had some significant increased in shortness of breath over the preceding weekend and marked weakness.  He denied any chest pain or chest discomfort.  He did complain of abdominal fullness.  He does have a family  history of congestive heart failure.  He has history of CAD, status post stent to the circumflex in 2005.  On examination in the office, the patient with rapid irregular irregular rhythm with a heart rate of 150 and by EKG was in atrial fibrillation.  Please see the admission note for past medical history, family history, social history, and admission exam.  HOSPITAL COURSE: 1. Cardiovascular:  The patient with new onset atrial fibrillation.     He was initially started on p.o. Cardizem, but was switched to     Cardizem drip.  He was put on heparin IV drip.  The patient did     have a 2-D echo performed on Aug 08, 2010, which showed normal LV     with mild increased wall thickness.  Ejection fraction was 55%.     Question of basal inferior hypokinesis with no other wall motion     abnormalities noted.  There was moderate mitral regurgitation.     Left atrium was moderately dilated.  The patient continued to     remain in atrial fibrillation, although his rate was improved on     medication.  It was felt that he needed to be ruled  out for any     ischemic disease and came to nuclear stress test with results as     noted.  Because of his persistent atrial fibrillation, it was     thought best to return him to sinus rhythm with attempted direct     current cardioversion.  The patient came to TEE to rule out any     atrial clot and with this being clear, he was cardioverted.  The     patient was in sinus rhythm at the end of the procedure and has     remained in sinus rhythm for almost 24 hours.  At this point with     evaluation complete for any ischemic disease, where there is no     evidence of congestive heart failure with improved respiratory     function with increased strength with conversion to normal sinus     rhythm, he is stable for discharge to home.  Per Cardiology     recommendations, he will be sent home on Cardizem 240 mg daily and     Lasix on a daily basis.  We will  discontinue Plavix.  We will     continue Pradaxa until seen in followup by Cardiology.  We will     discontinue digoxin at the time of discharge. 2. Pulmonary:  The patient's asthmatic bronchitis improved.  He was     treated with both incentive spirometry and flutter valve as well as     continued prednisone taper.  At the time of discharge, he will     continue prednisone for 2-3 more days and then discontinue.  He can     use a flutter valve on a p.r.n. basis. 3. Hyperglycemia.  The patient did have an elevated serum glucose at     the time of admission at 193.  This may have been related to     steroids.  He did have a hemoglobin A1c, which returned at 6.1%.     The patient is advised to follow a prudent diet with minimal sugar     and carbs.  No medication is indicated.  DISCHARGE EXAMINATION:  VITAL SIGNS:  Temperature of 96.9, blood pressure 109/67, heart rate 72 and in regular sinus rhythm by telemetry, respirations were 18, O2 sats 94% on room air. GENERAL APPEARANCE:  This is a well-nourished gentleman in no distress. HEENT:  Unremarkable. CHEST:  The patient is moving air well.  Very faint end-expiratory wheezes noted.  No rales were noted. CARDIOVASCULAR:  2+ radial pulses.  Precordium was quiet.  He was in a regular rate and rhythm.  I did not appreciate any significant murmur. ABDOMEN:  Soft.  No further examination conducted.  FINAL LABORATORY:  CBC from Aug 14, 2010, with a hemoglobin of 14.7 g, white count of 6200, platelet count 187,000.  Metabolic panel from Aug 13, 2010, sodium of 131, potassium 3.9, chloride 93, CO2 of 30, BUN of 27, creatinine 1.04, glucose was 112.  Magnesium, Aug 10, 2010, was 2.5 and normal.  Pro BNP on Aug 10, 2010, was 1021.  Ferritin on Aug 08, 2010, was 67.  Cardiac enzymes were cycled and were negative x3.  Lipid panel on Aug 08, 2010, with a cholesterol of 101, HDL 41, LDL 48, triglycerides 62.  TSH on Aug 07, 2010, was 0.28.  Of note pro BNP  was 2082 at admission.  DISPOSITION:  The patient is discharged home.  He may resume his full activities.  He will follow a prudent diet.  He will continue on all of his home medications.  NEW MEDICATIONS: 1. Cardizem CD 240 mg daily. 2. Pradaxa daily. 3. Lasix will be continued at 20 mg p.o. daily.  The patient's condition at the time of discharge dictation is stable and improved.  He will call Cardiology for a followup appointment in the next 7-10 days.  He will be seen in Primary Care in 14-21 days.  The patient's condition again is stable.     Heinz Knuckles Damaso Laday, MD     MEN/MEDQ  D:  08/14/2010  T:  08/14/2010  Job:  [id number]  cc:   Wallis Bamberg. Johnsie Cancel, MD, [facility] Minus Breeding, MD, [facility]  Electronically Signed by Adella Hare MD on 09/12/2010 09:08:08 PM

## 2010-09-26 ENCOUNTER — Other Ambulatory Visit: Payer: Self-pay | Admitting: Internal Medicine

## 2010-10-08 ENCOUNTER — Telehealth: Payer: Self-pay | Admitting: *Deleted

## 2010-10-08 MED ORDER — MOXIFLOXACIN HCL 400 MG PO TABS: 400.0000 mg | ORAL_TABLET | Freq: Every day | ORAL | Status: AC

## 2010-10-08 NOTE — Telephone Encounter (Signed)
Pt c/o return in same symptoms that put him in the hospital a month & half ago - C/o productive cough w/yellow-green mucus and wheezing. He is taking mucinex. Please advise.

## 2010-10-08 NOTE — Telephone Encounter (Signed)
Informed pt .

## 2010-10-08 NOTE — Telephone Encounter (Signed)
Pt called states 2 days ago he started experiencing the deep productive cough producing dark yellow mucous with wheezing. Pt states he started mucinex DM yesterday taking three in a 24 hour period. He states he is still having the cough with wheezing. Mucous has turned green. No fever. Please Advise

## 2010-10-08 NOTE — Telephone Encounter (Signed)
For avelox 492m qd x 7. Rx eScribed to pharmacy

## 2010-10-09 ENCOUNTER — Telehealth: Payer: Self-pay

## 2010-10-09 NOTE — Telephone Encounter (Signed)
Patient called lmovm requesting call back from nurse. He states that MD has him on heavy antibiotics and would like to know if he is contagious, he would like to have visitors. Thanks, Please advise

## 2010-10-09 NOTE — Telephone Encounter (Signed)
Left msg: probably started on antibiotics by consultant at [facility] He is advised that if he is without fever or productive sputum he is not contagious.

## 2010-10-12 ENCOUNTER — Telehealth: Payer: Self-pay | Admitting: *Deleted

## 2010-10-12 NOTE — Telephone Encounter (Signed)
Pt states the Avelox has helped . He still has a deep cough and would like to know if you suggest taking anything with the avelox

## 2010-10-14 ENCOUNTER — Other Ambulatory Visit: Payer: Self-pay | Admitting: Internal Medicine

## 2010-10-14 NOTE — Telephone Encounter (Signed)
Robitussin DM or any other cough syrup that contains Dextromethorophan and guafenascin

## 2010-10-15 ENCOUNTER — Ambulatory Visit (INDEPENDENT_AMBULATORY_CARE_PROVIDER_SITE_OTHER)
Admission: RE | Admit: 2010-10-15 | Discharge: 2010-10-15 | Disposition: A | Discharge status: Home / Self Care | Payer: 59 | Source: Ambulatory Visit | Attending: Internal Medicine | Admitting: Internal Medicine

## 2010-10-15 ENCOUNTER — Ambulatory Visit (INDEPENDENT_AMBULATORY_CARE_PROVIDER_SITE_OTHER): Payer: 59 | Admitting: Internal Medicine

## 2010-10-15 ENCOUNTER — Other Ambulatory Visit (INDEPENDENT_AMBULATORY_CARE_PROVIDER_SITE_OTHER): Payer: 59

## 2010-10-15 ENCOUNTER — Encounter: Payer: Self-pay | Admitting: Internal Medicine

## 2010-10-15 VITALS — BP 110/68 | HR 92 | Temp 97.6°F | Wt 178.0 lb

## 2010-10-15 DIAGNOSIS — J189 Pneumonia, unspecified organism: Secondary | ICD-10-CM

## 2010-10-15 DIAGNOSIS — Z0389 Encounter for observation for other suspected diseases and conditions ruled out: Secondary | ICD-10-CM

## 2010-10-15 DIAGNOSIS — Z0000 Encounter for general adult medical examination without abnormal findings: Secondary | ICD-10-CM

## 2010-10-15 DIAGNOSIS — R0602 Shortness of breath: Secondary | ICD-10-CM

## 2010-10-15 LAB — URINALYSIS
Bilirubin Urine: NEGATIVE
Hgb urine dipstick: NEGATIVE
Ketones, ur: NEGATIVE
Leukocytes, UA: NEGATIVE
Nitrite: NEGATIVE
Specific Gravity, Urine: <=1.005 (ref 1.000–1.030)
Total Protein, Urine: NEGATIVE
Urine Glucose: NEGATIVE
Urobilinogen, UA: 0.2 (ref 0.0–1.0)
pH: 6 (ref 5.0–8.0)

## 2010-10-15 LAB — CBC WITH DIFFERENTIAL/PLATELET
Basophils Absolute: 0 10*3/uL (ref 0.0–0.1)
Basophils Relative: 0.3 % (ref 0.0–3.0)
Eosinophils Absolute: 0.3 10*3/uL (ref 0.0–0.7)
Eosinophils Relative: 6.3 % — ABNORMAL HIGH (ref 0.0–5.0)
HCT: 37.4 % — ABNORMAL LOW (ref 39.0–52.0)
Hemoglobin: 12.9 g/dL — ABNORMAL LOW (ref 13.0–17.0)
Lymphocytes Relative: 24.2 % (ref 12.0–46.0)
Lymphs Abs: 1.1 10*3/uL (ref 0.7–4.0)
MCHC: 34.4 g/dL (ref 30.0–36.0)
MCV: 96.2 fl (ref 78.0–100.0)
Monocytes Absolute: 0.8 10*3/uL (ref 0.1–1.0)
Monocytes Relative: 18.2 % — ABNORMAL HIGH (ref 3.0–12.0)
Neutro Abs: 2.4 10*3/uL (ref 1.4–7.7)
Neutrophils Relative %: 51 % (ref 43.0–77.0)
Platelets: 219 10*3/uL (ref 150.0–400.0)
RBC: 3.89 Mil/uL — ABNORMAL LOW (ref 4.22–5.81)
RDW: 14.4 % (ref 11.5–14.6)
WBC: 4.7 10*3/uL (ref 4.5–10.5)

## 2010-10-15 LAB — LIPID PANEL
Cholesterol: 100 mg/dL (ref 0–200)
HDL: 36.7 mg/dL — ABNORMAL LOW (ref >39.00)
LDL Cholesterol: 48 mg/dL (ref 0–99)
Total CHOL/HDL Ratio: 3
Triglycerides: 75 mg/dL (ref 0.0–149.0)
VLDL: 15 mg/dL (ref 0.0–40.0)

## 2010-10-15 LAB — BASIC METABOLIC PANEL
BUN: 15 mg/dL (ref 6–23)
CO2: 33 mEq/L — ABNORMAL HIGH (ref 19–32)
Calcium: 8.3 mg/dL — ABNORMAL LOW (ref 8.4–10.5)
Chloride: 95 mEq/L — ABNORMAL LOW (ref 96–112)
Creatinine, Ser: 0.8 mg/dL (ref 0.4–1.5)
GFR: 106.68 mL/min (ref >60.00)
Glucose, Bld: 130 mg/dL — ABNORMAL HIGH (ref 70–99)
Potassium: 3 mEq/L — ABNORMAL LOW (ref 3.5–5.1)
Sodium: 136 mEq/L (ref 135–145)

## 2010-10-15 LAB — HEPATIC FUNCTION PANEL
ALT: 25 U/L (ref 0–53)
AST: 31 U/L (ref 0–37)
Albumin: 3.8 g/dL (ref 3.5–5.2)
Alkaline Phosphatase: 106 U/L (ref 39–117)
Bilirubin, Direct: 0.2 mg/dL (ref 0.0–0.3)
Total Bilirubin: 0.6 mg/dL (ref 0.3–1.2)
Total Protein: 6.1 g/dL (ref 6.0–8.3)

## 2010-10-15 LAB — TSH: TSH: 0.46 u[IU]/mL (ref 0.35–5.50)

## 2010-10-15 LAB — PSA: PSA: 1.95 ng/mL (ref 0.10–4.00)

## 2010-10-15 MED ORDER — OXYCODONE-ACETAMINOPHEN 5-325 MG PO TABS: 1.0000 | ORAL_TABLET | Freq: Four times a day (QID) | ORAL | Status: DC | PRN

## 2010-10-15 MED ORDER — OXYCODONE HCL 10 MG PO TB12: 10.0000 mg | ORAL_TABLET | Freq: Three times a day (TID) | ORAL | Status: DC | PRN

## 2010-10-15 MED ORDER — PROMETHAZINE-CODEINE 6.25-10 MG/5ML PO SYRP: 5.0000 mL | ORAL_SOLUTION | ORAL | Status: AC | PRN

## 2010-10-15 NOTE — Patient Instructions (Signed)
Question of pneumonia vs atelectasis on x-ray. Plan - CBC to look for signs of infection. If the white count is up will extend course of avelox. For cough - promethazine with codiene 1 tsp every 4-6 hours as needed; continue mucinex 2 tablets AM and PM; hydrate; tylenol for fever

## 2010-10-15 NOTE — Progress Notes (Signed)
  Subjective:    Patient ID: [name], male    DOB: Jun 16, 1945, 65 y.o.   MRN: [id number]  HPI Patient presents for folow-up of respiratory infection. He has completed a 7 day course of avelox. He continues to feel weak, to have a cough productive of a green phlegm and to be short of breath. CXR today reveals bilateral atelectasis vs ASD. His Oxygen saturation is low at 87%. He is not in any respiratory distress.  PMH, FamHx and SocHx reviewed for any changes and relevance.    Review of Systems  Constitutional: Positive for chills, activity change and fatigue.  HENT: Negative.   Eyes: Negative.   Respiratory: Positive for cough, chest tightness and shortness of breath.   Cardiovascular: Negative.   Gastrointestinal: Negative.   Genitourinary: Negative.   Musculoskeletal: Negative.   Neurological: Negative.   Hematological: Negative.        Objective:   Physical Exam Vitals noted - low O2 sat. Gen'l- WNWD white male no distress Chest - coarse rhonchi bilaterally, no wheezing, no increase work of breathing.        Assessment & Plan:  Pneumonia - finished antibiotics but still symptomatic.  Plan - CBC           Prom/cod for cough           For leukocytosis will extend antibiotic treatment.   Addendum: WBC 4.7 - no indication for further antibiotics

## 2010-10-16 ENCOUNTER — Ambulatory Visit: Payer: 59 | Admitting: Internal Medicine

## 2010-10-19 ENCOUNTER — Telehealth: Payer: Self-pay | Admitting: *Deleted

## 2010-10-19 ENCOUNTER — Other Ambulatory Visit: Payer: Self-pay | Admitting: Internal Medicine

## 2010-10-19 ENCOUNTER — Other Ambulatory Visit: Payer: Self-pay

## 2010-10-19 DIAGNOSIS — Z0000 Encounter for general adult medical examination without abnormal findings: Secondary | ICD-10-CM

## 2010-10-19 DIAGNOSIS — Z0389 Encounter for observation for other suspected diseases and conditions ruled out: Secondary | ICD-10-CM

## 2010-10-19 MED ORDER — MOXIFLOXACIN HCL 400 MG PO TABS: 400.0000 mg | ORAL_TABLET | Freq: Every day | ORAL | Status: AC

## 2010-10-19 NOTE — Telephone Encounter (Signed)
Pt called and states he is no better, still having deep cough ( with mucinex pt is able to cough up more mucous) he states he is sleeping about 15 hours. Sinus congestion with wheezing no better; still SOB. No fever. Pt states still feels bad. What do you advise for pt?

## 2010-10-19 NOTE — Telephone Encounter (Signed)
avelox 469m qd x 7 days, continue with mucines, cough syrup. Will need to see pulmonary - early next week.

## 2010-10-19 NOTE — Telephone Encounter (Signed)
Called patient - sent in Rx. Will need pulmonary appt Thursday or Friday of next week.

## 2010-10-22 ENCOUNTER — Encounter: Payer: Self-pay | Admitting: Internal Medicine

## 2010-10-22 ENCOUNTER — Telehealth: Payer: Self-pay | Admitting: Pulmonary Disease

## 2010-10-22 NOTE — Telephone Encounter (Signed)
LMTCB- see if he can come see MW Thurs am early- ok to [place]

## 2010-10-22 NOTE — Telephone Encounter (Signed)
There are no open available appts this week for a new consult. Will forward to CDY to see if we can use any of his block slots to work this consult in for cough/bronchitis for Dr. Linda Hedges. Pls advise.

## 2010-10-22 NOTE — Telephone Encounter (Signed)
I don't have any non-emergent openings. I see a contact with Dr Gwenette Greet ???

## 2010-10-22 NOTE — Telephone Encounter (Signed)
Called pulmonary and spoke with [place]. She is going to give my message to one of the nurses and have them call me to set up appt, to see if we can poss. Get a sooner appt

## 2010-10-23 NOTE — Telephone Encounter (Signed)
JUST an FYI, pt has appointment with Dr Melvyn Novas 10/25/2010 at [facility]

## 2010-10-23 NOTE — Telephone Encounter (Signed)
Spoke with Amy, Dr. Linda Hedges nurse, and pt will se MW on Thurs., 7/19 @ 9 am. Amy will contact the pt regarding this appt.

## 2010-10-23 NOTE — Telephone Encounter (Signed)
k

## 2010-10-25 ENCOUNTER — Encounter: Payer: Self-pay | Admitting: Internal Medicine

## 2010-10-25 ENCOUNTER — Ambulatory Visit (INDEPENDENT_AMBULATORY_CARE_PROVIDER_SITE_OTHER): Payer: 59 | Admitting: Internal Medicine

## 2010-10-25 VITALS — BP 120/68 | HR 96 | Temp 97.9°F | Ht 67.0 in | Wt 179.0 lb

## 2010-10-25 DIAGNOSIS — R05 Cough: Secondary | ICD-10-CM

## 2010-10-25 DIAGNOSIS — R059 Cough, unspecified: Secondary | ICD-10-CM

## 2010-10-25 MED ORDER — PREDNISONE 5 MG PO TABS: 5.0000 mg | ORAL_TABLET | Freq: Every day | ORAL | Status: DC

## 2010-10-25 MED ORDER — MOMETASONE FURO-FORMOTEROL FUM 100-5 MCG/ACT IN AERO: INHALATION_SPRAY | RESPIRATORY_TRACT | Status: DC

## 2010-10-25 NOTE — Assessment & Plan Note (Signed)
The most common causes of chronic cough in immunocompetent adults include the following: upper airway cough syndrome (UACS), previously referred to as postnasal drip syndrome (PNDS), which is caused by variety of rhinosinus conditions; (2) asthma; (3) GERD; (4) chronic bronchitis from cigarette smoking or other inhaled environmental irritants; (5) nonasthmatic eosinophilic bronchitis; and (6) bronchiectasis.   These conditions, singly or in combination, have accounted for up to 94% of the causes of chronic cough in prospective studies.   Other conditions have constituted no >6% of the causes in prospective studies These have included bronchogenic carcinoma, chronic interstitial pneumonia, sarcoidosis, left ventricular failure, ACEI-induced cough, and aspiration from a condition associated with pharyngeal dysfunction.   This is most c/w  Classic Upper airway cough syndrome, so named because it's frequently impossible to sort out how much is  CR/sinusitis with freq throat clearing (which can be related to primary GERD)   vs  causing  secondary (" extra esophageal")  GERD from wide swings in gastric pressure that occur with throat clearing, often  promoting self use of mint and menthol lozenges that reduce the lower esophageal sphincter tone and exacerbate the problem further in a cyclical fashion.   These are the same pts who not infrequently have failed to tolerate ace inhibitors,  dry powder inhalers or biphosphonates or report having reflux symptoms that don't respond to standard doses of PPI , and are easily confused as having aecopd or asthma flares,   For now will rx as rhinitis/ asthmatic bronchitis and try to sort out underlying primary problem ? Chronic sinusitis ? Occult GERD

## 2010-10-25 NOTE — Progress Notes (Signed)
Subjective:     Patient ID: [name], male   DOB: 04-29-1945, 65 y.o.   MRN: [id number]  HPI 34 yowm never smoker on chronic steroids for RA per [facility] x 2007  on allergy shots as child stopped in [facility] with a lot coughing, sneezing, runny nose then but never dx'd with asthma and "resp health fine ever since" until April 2012 referred to pulmonary clinic 10/2010 for cough by Dr Linda Hedges  10/25/2010 Initial pulmonary office eval for persistent cough acute onset in April 2012 "like a cold" usually productive of green to yellow never cleared back to white p abx now on avelox and flared some off it, some it better on it, some better on foradil and prednisone.    Pt denies any significant dysphagia, itching, sneezing,    fever, chills, sweats, unintended wt loss, pleuritic or exertional cp, hempoptysis, orthopnea pnd or leg swelling.    Also denies any obvious fluctuation of symptoms with weather or environmental changes or other aggravating or alleviating factors.    Review of Systems  Constitutional: Positive for appetite change. Negative for fever, chills, activity change and unexpected weight change.  HENT: Positive for congestion, sore throat and postnasal drip. Negative for rhinorrhea, sneezing, trouble swallowing, dental problem and voice change.   Eyes: Negative for visual disturbance.  Respiratory: Positive for cough and shortness of breath. Negative for choking.   Cardiovascular: Negative for chest pain and leg swelling.  Gastrointestinal: Negative for nausea, vomiting and abdominal pain.  Genitourinary: Negative for difficulty urinating.  Musculoskeletal: Negative for arthralgias.  Skin: Negative for rash.  Psychiatric/Behavioral: Negative for behavioral problems and confusion.       Objective:   Physical Exam amb wm very anxious and hard to keep focused on one symptom at a time Wt 179 10/25/2010 Nasal tone to voice  HEENT mild turbinate edema.  Oropharynx no thrush  or excess pnd or cobblestoning.  No JVD or cervical adenopathy. Mild accessory muscle hypertrophy. Trachea midline, nl thryroid. Chest was hyperinflated by percussion with diminished breath sounds and moderate increased exp time with trace end exp bilateral  wheeze. Hoover sign positive at mid inspiration. Regular rate and rhythm without murmur gallop or rub or increase P2 or edema.  Abd: no hsm, nl excursion. Ext warm without cyanosis or clubbing.      Assessment:         Plan:

## 2010-10-25 NOTE — Patient Instructions (Addendum)
Return  2 weeks with all your medications, even over the counter meds, separated in two separate bags, the ones you take no matter what vs the ones you stop once you feel better and take only as needed when you feel you need them.   [name]  will generate for you a new user friendly medication calendar that will put [place] all on the same page re: your medication use.     Without this process, it simply isn't possible to assure that we are providing  your outpatient care  with  the attention to detail we feel you deserve.   If we cannot assure that you're getting that kind of care,  then we cannot manage your problem effectively from this clinic.  GERD (REFLUX)  is an extremely common cause of respiratory symptoms, many times with no significant heartburn at all.    It can be treated with medication, but also with lifestyle changes including avoidance of late meals, excessive alcohol, smoking cessation, and avoid fatty foods, chocolate, peppermint, colas, red wine, and acidic juices such as orange juice.  NO MINT OR MENTHOL PRODUCTS SO NO COUGH DROPS  USE SUGARLESS CANDY INSTEAD (jolley ranchers or Stover's)  NO OIL BASED VITAMINS (no fish oil)  Protonix 40 mg   Take 30-60 min before first meal of the day and Pepcid 20 mg one bedtime until cough is completely gone for at least a week without the need for cough suppression  I think of reflux for chronic cough like I do oxygen for fire (doesn't cause the fire but once you get the oxygen suppressed it usually goes away regardless of the exact cause).   Work on inhaler technique:  relax and gently blow all the way out then take a nice smooth deep breath back in, triggering the inhaler at same time you start breathing in.  Hold for up to 5 seconds if you can.  Rinse and gargle with water when done   If your mouth or throat starts to bother you,   I suggest you time the inhaler to your dental care and after using the inhaler(s) brush teeth and tongue with a  baking soda containing toothpaste and when you rinse this out, gargle with it first to see if this helps your mouth and throat.     Please see patient coordinator before you leave today  to schedule sinus CT    .

## 2010-10-26 ENCOUNTER — Ambulatory Visit (INDEPENDENT_AMBULATORY_CARE_PROVIDER_SITE_OTHER)
Admission: RE | Admit: 2010-10-26 | Discharge: 2010-10-26 | Disposition: A | Discharge status: Home / Self Care | Payer: 59 | Source: Ambulatory Visit | Attending: Internal Medicine | Admitting: Internal Medicine

## 2010-10-26 ENCOUNTER — Telehealth: Payer: Self-pay | Admitting: Internal Medicine

## 2010-10-26 ENCOUNTER — Institutional Professional Consult (permissible substitution): Payer: 59 | Admitting: Internal Medicine

## 2010-10-26 DIAGNOSIS — R059 Cough, unspecified: Secondary | ICD-10-CM

## 2010-10-26 DIAGNOSIS — R05 Cough: Secondary | ICD-10-CM

## 2010-10-26 MED ORDER — AMOXICILLIN-POT CLAVULANATE 875-125 MG PO TABS: 1.0000 | ORAL_TABLET | Freq: Two times a day (BID) | ORAL | Status: AC

## 2010-10-26 NOTE — Progress Notes (Signed)
Quick Note:  Spoke with pt and notified of results per Dr. Wert. Pt verbalized understanding and denied any questions.  ______ 

## 2010-10-26 NOTE — Telephone Encounter (Signed)
Spoke with pt and notified of recs regarding sinus ct scan. Pt verbalized understanding and his rx was called to the [facility].

## 2010-11-05 ENCOUNTER — Encounter: Payer: Self-pay | Admitting: Internal Medicine

## 2010-11-08 ENCOUNTER — Ambulatory Visit (INDEPENDENT_AMBULATORY_CARE_PROVIDER_SITE_OTHER): Payer: 59 | Admitting: Internal Medicine

## 2010-11-08 ENCOUNTER — Encounter: Payer: Self-pay | Admitting: Internal Medicine

## 2010-11-08 VITALS — BP 124/80 | HR 68 | Temp 98.3°F | Ht 67.0 in | Wt 172.8 lb

## 2010-11-08 DIAGNOSIS — R059 Cough, unspecified: Secondary | ICD-10-CM

## 2010-11-08 DIAGNOSIS — J329 Chronic sinusitis, unspecified: Secondary | ICD-10-CM

## 2010-11-08 DIAGNOSIS — R05 Cough: Secondary | ICD-10-CM

## 2010-11-08 NOTE — Assessment & Plan Note (Signed)
I had an extended discussion with the patient today lasting 15 to 20 minutes of a 25 minute visit on the following issues:  Plan is to complete 3 weeks augmentin then ct sinus and ent referral if not better

## 2010-11-08 NOTE — Assessment & Plan Note (Signed)
Classic Upper airway cough syndrome, so named because it's frequently impossible to sort out how much is  CR/sinusitis with freq throat clearing (which can be related to primary GERD)   vs  causing  secondary (" extra esophageal")  GERD from wide swings in gastric pressure that occur with throat clearing, often  promoting self use of mint and menthol lozenges that reduce the lower esophageal sphincter tone and exacerbate the problem further in a cyclical fashion.   These are the same pts who not infrequently have failed to tolerate ace inhibitors,  dry powder inhalers or biphosphonates or report having reflux symptoms that don't respond to standard doses of PPI , and are easily confused as having aecopd or asthma flares,  See instructions for specific recommendations which were reviewed directly with the patient who was given a copy with highlighter outlining the key components.   The proper method of use, as well as anticipated side effects, of this metered-dose inhaler are discussed and demonstrated to the patient. Improved to 75% with coaching

## 2010-11-08 NOTE — Progress Notes (Signed)
Subjective:     Patient ID: [name], male   DOB: 1946/02/17, 65 y.o.   MRN: [id number]  HPI  70 yowm never smoker on chronic steroids for RA per [facility] x 2007  on allergy shots as child stopped in [facility] with a lot coughing, sneezing, runny nose then but never dx'd with asthma and "resp health fine ever since" until April 2012 referred to pulmonary clinic 10/2010 for cough by Dr Linda Hedges  10/25/2010 Initial pulmonary office eval for persistent cough acute onset in April 2012 "like a cold" usually productive of green to yellow never cleared back to white p abx now on avelox and flared some off it, some it better on it, some better on foradil and prednisone.  Return  2 weeks with all your medications, even over the counter meds, separated in two separate bags, the ones you take no matter what vs the ones you stop once you feel better and take only as needed when you feel you need them.   [name]  will generate for you a new user friendly medication calendar that will put [place] all on the same page re: your medication use.     Without this process, it simply isn't possible to assure that we are providing  your outpatient care  with  the attention to detail we feel you deserve.   If we cannot assure that you're getting that kind of care,  then we cannot manage your problem effectively from this clinic.  GERD (REFLUX)  is an extremely common cause of respiratory symptoms, many times with no significant heartburn at all.    It can be treated with medication, but also with lifestyle changes including avoidance of late meals, excessive alcohol, smoking cessation, and avoid fatty foods, chocolate, peppermint, colas, red wine, and acidic juices such as orange juice.  NO MINT OR MENTHOL PRODUCTS SO NO COUGH DROPS  USE SUGARLESS CANDY INSTEAD (jolley ranchers or Stover's)  NO OIL BASED VITAMINS (no fish oil)  Protonix (pantoprazole) 40 mg   Take 30-60 min before first meal of the day and Pepcid 20 mg  one bedtime until cough is completely gone for at least a week without the need for cough suppression  I think of reflux for chronic cough like I do oxygen for fire (doesn't cause the fire but once you get the oxygen suppressed it usually goes away regardless of the exact cause).   Work on inhaler technique:  relax and gently blow all the way out then take a nice smooth deep breath back in, triggering the inhaler at same time you start breathing in.  Hold for up to 5 seconds if you can.  Rinse and gargle with water when done   If your mouth or throat starts to bother you,   I suggest you time the inhaler to your dental care and after using the inhaler(s) brush teeth and tongue with a baking soda containing toothpaste and when you rinse this out, gargle with it first to see if this helps your mouth and throat.     Please see patient coordinator before you leave today  to schedule sinus CT> POSITIVE = Near total opacification of the paranasal sinuses with air  fluid levels evident in the maxillary sinuses, sphenoid sinuses,  and right frontal sinus > rx augmentin x 21 days    11/08/2010 f/u ov/[facility] cc  Confused with meds in 3 bags, not organized at all as recommended. Much better cough and sob.  Pt denies any significant sore throat, dysphagia, itching, sneezing,    excess/ purulent nasal secretions,  fever, chills, sweats, unintended wt loss, pleuritic or exertional cp, hempoptysis, orthopnea pnd or leg swelling.    Also denies any obvious fluctuation of symptoms with weather or environmental changes or other aggravating or alleviating factors.     .       Objective:   Physical Exam amb wm very anxious and hard to keep focused on one symptom at a time Wt 179 10/25/2010 > 172 11/08/2010  Nasal tone to voice less severe HEENT mild turbinate edema.  Oropharynx no thrush or excess pnd or cobblestoning.  No JVD or cervical adenopathy. Mild accessory muscle hypertrophy. Trachea midline, nl  thryroid. Chest was hyperinflated by percussion with diminished breath sounds and moderate increased exp time with trace end exp bilateral  wheeze. Hoover sign positive at mid inspiration. Regular rate and rhythm without murmur gallop or rub or increase P2 or edema.  Abd: no hsm, nl excursion. Ext warm without cyanosis or clubbing.      Assessment:         Plan:

## 2010-11-08 NOTE — Patient Instructions (Addendum)
Finish the antibiotics and call if coughing, shortness breathing are not better to your satisfaction call [facility] to set up repeat CT Sinus  [phone]  Please schedule a follow up office visit in 4 weeks, sooner if needed

## 2010-11-12 ENCOUNTER — Encounter: Payer: Self-pay | Admitting: Cardiovascular Disease

## 2010-11-12 ENCOUNTER — Other Ambulatory Visit: Payer: Self-pay | Admitting: Internal Medicine

## 2010-11-15 ENCOUNTER — Telehealth: Payer: Self-pay | Admitting: Internal Medicine

## 2010-11-15 DIAGNOSIS — J329 Chronic sinusitis, unspecified: Secondary | ICD-10-CM

## 2010-11-15 NOTE — Telephone Encounter (Signed)
Pt is finished with abx and he is requesting to have a repeat CT sinus to see if infection gone. Please advise if ok to place order.[name], CMA

## 2010-11-15 NOTE — Telephone Encounter (Signed)
[place] wit me, limited spine

## 2010-11-15 NOTE — Telephone Encounter (Signed)
Order placed and Rose aware [facility] will call to schedule. [place] Bing, CMA

## 2010-11-16 ENCOUNTER — Other Ambulatory Visit: Payer: Self-pay | Admitting: Internal Medicine

## 2010-11-16 ENCOUNTER — Other Ambulatory Visit: Payer: 59

## 2010-11-16 ENCOUNTER — Ambulatory Visit (INDEPENDENT_AMBULATORY_CARE_PROVIDER_SITE_OTHER)
Admission: RE | Admit: 2010-11-16 | Discharge: 2010-11-16 | Disposition: A | Discharge status: Home / Self Care | Payer: 59 | Source: Ambulatory Visit | Attending: Internal Medicine | Admitting: Internal Medicine

## 2010-11-16 DIAGNOSIS — J329 Chronic sinusitis, unspecified: Secondary | ICD-10-CM

## 2010-11-16 DIAGNOSIS — Z0000 Encounter for general adult medical examination without abnormal findings: Secondary | ICD-10-CM

## 2010-11-16 DIAGNOSIS — Z0389 Encounter for observation for other suspected diseases and conditions ruled out: Secondary | ICD-10-CM

## 2010-11-16 NOTE — Progress Notes (Signed)
Quick Note:  Spoke with pt and notified of results per Dr. Wert. Pt verbalized understanding and denied any questions.  ______ 

## 2010-11-20 ENCOUNTER — Encounter: Payer: Self-pay | Admitting: Internal Medicine

## 2010-12-11 ENCOUNTER — Encounter: Payer: Self-pay | Admitting: Internal Medicine

## 2010-12-11 ENCOUNTER — Ambulatory Visit (INDEPENDENT_AMBULATORY_CARE_PROVIDER_SITE_OTHER)
Admission: RE | Admit: 2010-12-11 | Discharge: 2010-12-11 | Disposition: A | Discharge status: Home / Self Care | Payer: 59 | Source: Ambulatory Visit | Attending: Internal Medicine | Admitting: Internal Medicine

## 2010-12-11 ENCOUNTER — Ambulatory Visit (INDEPENDENT_AMBULATORY_CARE_PROVIDER_SITE_OTHER): Payer: 59 | Admitting: Internal Medicine

## 2010-12-11 VITALS — BP 122/78 | HR 81 | Temp 97.5°F | Ht 67.0 in | Wt 173.0 lb

## 2010-12-11 DIAGNOSIS — M069 Rheumatoid arthritis, unspecified: Secondary | ICD-10-CM

## 2010-12-11 DIAGNOSIS — G5680 Other specified mononeuropathies of unspecified upper limb: Secondary | ICD-10-CM

## 2010-12-11 DIAGNOSIS — R05 Cough: Secondary | ICD-10-CM

## 2010-12-11 DIAGNOSIS — R059 Cough, unspecified: Secondary | ICD-10-CM

## 2010-12-11 DIAGNOSIS — G588 Other specified mononeuropathies: Secondary | ICD-10-CM

## 2010-12-11 DIAGNOSIS — J329 Chronic sinusitis, unspecified: Secondary | ICD-10-CM

## 2010-12-11 NOTE — Patient Instructions (Addendum)
Take mucinex dm only as needed   Try taper off the dulera, first do 1 twice daily for a week and see if any symptoms flare, then 1 at bedtime and stop after another week  Call if exercise tolerance declines for any reason   If you are satisfied with your treatment plan let your doctor know and he/she can either refill your medications or you can return here when your prescription runs out.     If in any way you are not 100% satisfied,  please tell us.  If 100% better, tell your friends!

## 2010-12-11 NOTE — Progress Notes (Signed)
Subjective:     Patient ID: [name], male   DOB: 12-Jul-1945, 65 y.o.   MRN: [id number]  HPI  45 yowm never smoker on chronic steroids for RA per [facility] x 2007  on allergy shots as child stopped in [facility] with a lot coughing, sneezing, runny nose then but never dx'd with asthma and "resp health fine ever since" until April 2012 referred to pulmonary clinic 10/2010 for cough by Dr Linda Hedges  10/25/2010 Initial pulmonary office eval for persistent cough acute onset in April 2012 "like a cold" usually productive of green to yellow never cleared back to white p abx now on avelox and flared some off it, some it better on it, some better on foradil and prednisone.  Rec harder work on full complete and accurate medication reconciliation. GERD diet Protonix (pantoprazole) 40 mg   Take 30-60 min before first meal of the day and Pepcid 20 mg one bedtime until cough is completely gone for at least a week without the need for cough suppression Work on inhaler technique: .    Please see patient coordinator before you leave today  to schedule sinus CT> POSITIVE = Near total opacification of the paranasal sinuses with air  fluid levels evident in the maxillary sinuses, sphenoid sinuses,  and right frontal sinus > rx augmentin x 21 days    11/08/2010 f/u ov/[facility] cc  Confused with meds in 3 bags, not organized at all as recommended. Much better cough and sob.  rec  Complete abx, f/u ent > [facility] "everything's fine, agree with prolonged abx/ nasal saline irrigation.   12/11/2010 f/u ov/[facility] cc cough gone p 3weeks of augmentin with rash and end of therapy and Janace Hoard said no surgery, has maintained on prednsione at 5 mg per day (previous floor was 4 mg per rheum) and dulera 100/5 2 bid.  Pt denies any significant sore throat, dysphagia, itching, sneezing,    excess/ purulent nasal secretions,  fever, chills, sweats, unintended wt loss, pleuritic or exertional cp, hempoptysis, orthopnea pnd or leg swelling.     Also denies any obvious fluctuation of symptoms with weather or environmental changes or other aggravating or alleviating factors.     .       Objective:   Physical Exam amb wm very anxious and hard to keep focused on one symptom at a time Wt 179 10/25/2010 > 172 11/08/2010  > 173  12/11/10 Nasal tone to voice less severe HEENT mild turbinate edema.  Oropharynx no thrush or excess pnd or cobblestoning.  No JVD or cervical adenopathy. Mild accessory muscle hypertrophy. Trachea midline, nl thryroid. Chest was hyperinflated by percussion with diminished breath sounds and moderate increased exp time with trace end exp bilateral  wheeze. Hoover sign positive at mid inspiration. Regular rate and rhythm without murmur gallop or rub or increase P2 or edema.  Abd: no hsm, nl excursion. Ext warm without cyanosis or clubbing.    CXR  12/11/2010 :  No active lung disease. Persistent chronic elevation of the right Hemidiaphragm (dates back to 03/16/09)      Assessment:         Plan:

## 2010-12-12 ENCOUNTER — Ambulatory Visit (INDEPENDENT_AMBULATORY_CARE_PROVIDER_SITE_OTHER): Payer: 59 | Admitting: Cardiovascular Disease

## 2010-12-12 ENCOUNTER — Encounter: Payer: Self-pay | Admitting: Internal Medicine

## 2010-12-12 ENCOUNTER — Encounter: Payer: Self-pay | Admitting: Cardiovascular Disease

## 2010-12-12 DIAGNOSIS — E785 Hyperlipidemia, unspecified: Secondary | ICD-10-CM

## 2010-12-12 DIAGNOSIS — I2510 Atherosclerotic heart disease of native coronary artery without angina pectoris: Secondary | ICD-10-CM

## 2010-12-12 DIAGNOSIS — G588 Other specified mononeuropathies: Secondary | ICD-10-CM | POA: Insufficient documentation

## 2010-12-12 DIAGNOSIS — G5680 Other specified mononeuropathies of unspecified upper limb: Secondary | ICD-10-CM | POA: Insufficient documentation

## 2010-12-12 DIAGNOSIS — I4891 Unspecified atrial fibrillation: Secondary | ICD-10-CM

## 2010-12-12 DIAGNOSIS — I10 Essential (primary) hypertension: Secondary | ICD-10-CM

## 2010-12-12 NOTE — Assessment & Plan Note (Signed)
Cholesterol is at goal.  Continue current dose of statin and diet Rx.  No myalgias or side effects.  F/U  LFT's in 6 months. Lab Results  Component Value Date   LDLCALC 48 10/15/2010

## 2010-12-12 NOTE — Assessment & Plan Note (Signed)
cxr is  Now nl, no evidence of significant RA assoc lung dz

## 2010-12-12 NOTE — Assessment & Plan Note (Signed)
Stable with no angina and good activity level.  Continue medical Rx Myovue normal 5/12  Intervention to circumflex 2006.  Stop Plavix

## 2010-12-12 NOTE — Assessment & Plan Note (Signed)
If recurs off abx probably the next step is complete the allergy w/u and consider referral to Dr Annamaria Boots, out inhouse allergist, though daily prednisone for RA should eliminate eos bronchitis/ rhitis/sinusits in the ddx
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
## 2010-12-12 NOTE — Patient Instructions (Signed)
Your physician recommends that you schedule a follow-up appointment in: [place] has recommended you make the following change in your medication: STOP PLAVIX

## 2010-12-12 NOTE — Assessment & Plan Note (Signed)
Maint NSR S/P TEE/DCC 5/12 Pradaxa stopped 6/12

## 2010-12-12 NOTE — Assessment & Plan Note (Signed)
Would need fluoroscopy with sniff test to document but this is longstanding - main treatment is wt loss to help the other insp including the L HD work better
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
## 2010-12-12 NOTE — Progress Notes (Signed)
Quick Note:  Spoke with pt and notified of results per Dr. Wert. Pt verbalized understanding and denied any questions.  ______ 

## 2010-12-12 NOTE — Assessment & Plan Note (Signed)
Well controlled.  Continue current medications and low sodium Dash type diet.    

## 2010-12-19 ENCOUNTER — Other Ambulatory Visit: Payer: Self-pay | Admitting: Internal Medicine

## 2010-12-28 LAB — BASIC METABOLIC PANEL
BUN: 7
CO2: 30
Calcium: 9
Chloride: 94 — ABNORMAL LOW
Creatinine, Ser: 0.66
GFR calc Af Amer: >60
GFR calc non Af Amer: >60
Glucose, Bld: 114 — ABNORMAL HIGH
Potassium: 3.9
Sodium: 131 — ABNORMAL LOW

## 2010-12-28 LAB — POCT HEMOGLOBIN-HEMACUE: Hemoglobin: 15.3

## 2010-12-29 ENCOUNTER — Other Ambulatory Visit: Payer: Self-pay | Admitting: Internal Medicine

## 2010-12-31 ENCOUNTER — Telehealth: Payer: Self-pay | Admitting: *Deleted

## 2010-12-31 NOTE — Telephone Encounter (Signed)
Record shows he has seen Dr. Beola Cord in the past and this is a very good person to see for foot problems

## 2010-12-31 NOTE — Telephone Encounter (Signed)
Pt is wanting to know who suggest for a foot specialist. Pt having what he thinks arthritis. Please Advise

## 2011-01-01 MED ORDER — OXYCODONE-ACETAMINOPHEN 5-325 MG PO TABS: 1.0000 | ORAL_TABLET | Freq: Four times a day (QID) | ORAL | Status: DC | PRN

## 2011-01-01 MED ORDER — OXYCODONE HCL 10 MG PO TB12: 10.0000 mg | ORAL_TABLET | Freq: Three times a day (TID) | ORAL | Status: DC | PRN

## 2011-01-01 NOTE — Telephone Encounter (Signed)
Pt aware today while in office with his mother. He is also req RFs of OxyContin & Oxycodone.

## 2011-01-01 NOTE — Telephone Encounter (Signed)
Ok for Rx's x3 if he is due.

## 2011-01-01 NOTE — Telephone Encounter (Signed)
Pending signature

## 2011-01-04 ENCOUNTER — Ambulatory Visit (INDEPENDENT_AMBULATORY_CARE_PROVIDER_SITE_OTHER): Payer: 59 | Admitting: Internal Medicine

## 2011-01-04 DIAGNOSIS — R109 Unspecified abdominal pain: Secondary | ICD-10-CM

## 2011-01-04 MED ORDER — ONDANSETRON HCL 4 MG PO TABS: 4.0000 mg | ORAL_TABLET | Freq: Three times a day (TID) | ORAL | Status: DC | PRN

## 2011-01-06 ENCOUNTER — Emergency Department (HOSPITAL_COMMUNITY): Payer: 59

## 2011-01-06 ENCOUNTER — Inpatient Hospital Stay (HOSPITAL_COMMUNITY)
Admission: EM | Admit: 2011-01-06 | Discharge: 2011-01-11 | DRG: 390 | Disposition: A | Discharge status: Home / Self Care | Payer: 59 | Attending: General Surgery | Admitting: General Surgery

## 2011-01-06 DIAGNOSIS — J4489 Other specified chronic obstructive pulmonary disease: Secondary | ICD-10-CM | POA: Diagnosis present

## 2011-01-06 DIAGNOSIS — J320 Chronic maxillary sinusitis: Secondary | ICD-10-CM | POA: Diagnosis present

## 2011-01-06 DIAGNOSIS — K5900 Constipation, unspecified: Secondary | ICD-10-CM | POA: Diagnosis present

## 2011-01-06 DIAGNOSIS — K565 Intestinal adhesions [bands], unspecified as to partial versus complete obstruction: Principal | ICD-10-CM | POA: Diagnosis present

## 2011-01-06 DIAGNOSIS — I2510 Atherosclerotic heart disease of native coronary artery without angina pectoris: Secondary | ICD-10-CM | POA: Diagnosis present

## 2011-01-06 DIAGNOSIS — M549 Dorsalgia, unspecified: Secondary | ICD-10-CM | POA: Diagnosis present

## 2011-01-06 DIAGNOSIS — Z7982 Long term (current) use of aspirin: Secondary | ICD-10-CM

## 2011-01-06 DIAGNOSIS — K56609 Unspecified intestinal obstruction, unspecified as to partial versus complete obstruction: Secondary | ICD-10-CM

## 2011-01-06 DIAGNOSIS — R109 Unspecified abdominal pain: Secondary | ICD-10-CM

## 2011-01-06 DIAGNOSIS — J449 Chronic obstructive pulmonary disease, unspecified: Secondary | ICD-10-CM | POA: Diagnosis present

## 2011-01-06 DIAGNOSIS — F411 Generalized anxiety disorder: Secondary | ICD-10-CM | POA: Diagnosis present

## 2011-01-06 DIAGNOSIS — R112 Nausea with vomiting, unspecified: Secondary | ICD-10-CM | POA: Diagnosis present

## 2011-01-06 DIAGNOSIS — G8929 Other chronic pain: Secondary | ICD-10-CM | POA: Diagnosis present

## 2011-01-06 DIAGNOSIS — I4891 Unspecified atrial fibrillation: Secondary | ICD-10-CM | POA: Diagnosis present

## 2011-01-06 LAB — CBC
HCT: 48.7 % (ref 39.0–52.0)
Hemoglobin: 17 g/dL (ref 13.0–17.0)
MCH: 31.4 pg (ref 26.0–34.0)
MCHC: 34.9 g/dL (ref 30.0–36.0)
MCV: 89.9 fL (ref 78.0–100.0)
Platelets: 243 10*3/uL (ref 150–400)
RBC: 5.42 MIL/uL (ref 4.22–5.81)
RDW: 13.2 % (ref 11.5–15.5)
WBC: 9.6 10*3/uL (ref 4.0–10.5)

## 2011-01-06 LAB — DIFFERENTIAL
Basophils Absolute: 0 10*3/uL (ref 0.0–0.1)
Basophils Relative: 0 % (ref 0–1)
Eosinophils Absolute: 0.1 10*3/uL (ref 0.0–0.7)
Eosinophils Relative: 1 % (ref 0–5)
Lymphocytes Relative: 10 % — ABNORMAL LOW (ref 12–46)
Lymphs Abs: 0.9 10*3/uL (ref 0.7–4.0)
Monocytes Absolute: 0.9 10*3/uL (ref 0.1–1.0)
Monocytes Relative: 9 % (ref 3–12)
Neutro Abs: 7.7 10*3/uL (ref 1.7–7.7)
Neutrophils Relative %: 80 % — ABNORMAL HIGH (ref 43–77)

## 2011-01-06 LAB — COMPREHENSIVE METABOLIC PANEL
ALT: 57 U/L — ABNORMAL HIGH (ref 0–53)
AST: 44 U/L — ABNORMAL HIGH (ref 0–37)
Albumin: 4.4 g/dL (ref 3.5–5.2)
Alkaline Phosphatase: 143 U/L — ABNORMAL HIGH (ref 39–117)
BUN: 14 mg/dL (ref 6–23)
CO2: 32 mEq/L (ref 19–32)
Calcium: 10.4 mg/dL (ref 8.4–10.5)
Chloride: 84 mEq/L — ABNORMAL LOW (ref 96–112)
Creatinine, Ser: 0.78 mg/dL (ref 0.50–1.35)
GFR calc Af Amer: >60 mL/min (ref >60)
GFR calc non Af Amer: >60 mL/min (ref >60)
Glucose, Bld: 139 mg/dL — ABNORMAL HIGH (ref 70–99)
Potassium: 3.3 mEq/L — ABNORMAL LOW (ref 3.5–5.1)
Sodium: 130 mEq/L — ABNORMAL LOW (ref 135–145)
Total Bilirubin: 0.7 mg/dL (ref 0.3–1.2)
Total Protein: 8 g/dL (ref 6.0–8.3)

## 2011-01-06 NOTE — Progress Notes (Signed)
  Subjective:    Patient ID: [name], male    DOB: July 10, 1945, 65 y.o.   MRN: [id number]  HPI Mr. Liszewski is seen acutely for a three day h/o abdominal pain with nausea and vomiting with subsequent decreased po intake. He dates on-set of his symptoms to a meal of chili con carne at [facility] several days ago. He has not had any fever, diarrhea, hematemesis or hematochezia. The pain is limiting his activity. He has no change in bowel habit or color, caliber or consistency of stool.   I have reviewed the patient's medical history in detail and updated the computerized patient record.    Review of Systems System review is negative for any constitutional, cardiac, pulmonary, GI or neuro symptoms or complaints other than as described in the HPI.     Objective:   Physical Exam Vitals - noted Gen'l - WNWD white man in no distress  HEENT - C&S w/o icterus Abdomen - BS+ x 4, no guarding, rebound, mass, point tenderness, HSM       Assessment & Plan:  Abdominal pain - patient with acute onset abdominal pain with N/V. Exam is normal. He has been able to keep down fluids. No fevers. Doubt food poisoning with delayed onset and duration of symptoms.  Plan - mylicon           Continue PPI therapy           Push fluids but otherwise a bland diet with supplements           For worsening pain or prolonged duration will need to be reevaluated.

## 2011-01-07 ENCOUNTER — Inpatient Hospital Stay (HOSPITAL_COMMUNITY): Payer: 59

## 2011-01-07 DIAGNOSIS — K56609 Unspecified intestinal obstruction, unspecified as to partial versus complete obstruction: Secondary | ICD-10-CM

## 2011-01-07 LAB — BASIC METABOLIC PANEL
BUN: 8 mg/dL (ref 6–23)
CO2: 34 mEq/L — ABNORMAL HIGH (ref 19–32)
Calcium: 8.4 mg/dL (ref 8.4–10.5)
Chloride: 99 mEq/L (ref 96–112)
Creatinine, Ser: 0.58 mg/dL (ref 0.50–1.35)
GFR calc Af Amer: >90 mL/min (ref >90)
GFR calc non Af Amer: >90 mL/min (ref >90)
Glucose, Bld: 123 mg/dL — ABNORMAL HIGH (ref 70–99)
Potassium: 3.6 mEq/L (ref 3.5–5.1)
Sodium: 137 mEq/L (ref 135–145)

## 2011-01-07 LAB — CBC
HCT: 41.5 % (ref 39.0–52.0)
Hemoglobin: 13.7 g/dL (ref 13.0–17.0)
MCH: 30.6 pg (ref 26.0–34.0)
MCHC: 33 g/dL (ref 30.0–36.0)
MCV: 92.6 fL (ref 78.0–100.0)
Platelets: 209 10*3/uL (ref 150–400)
RBC: 4.48 MIL/uL (ref 4.22–5.81)
RDW: 13.5 % (ref 11.5–15.5)
WBC: 5.6 10*3/uL (ref 4.0–10.5)

## 2011-01-07 LAB — PROTIME-INR
INR: 1.22 (ref 0.00–1.49)
Prothrombin Time: 15.7 seconds — ABNORMAL HIGH (ref 11.6–15.2)

## 2011-01-08 NOTE — H&P (Signed)
[name], [name]            ACCOUNT NO.:  [id number]  MEDICAL RECORD NO.:  [id number]  LOCATION:  [facility]                         FACILITY:  [facility]  PHYSICIAN:  John Regal, MD      DATE OF BIRTH:  March 22, 1946  DATE OF ADMISSION:  01/06/2011 DATE OF DISCHARGE:                             HISTORY & PHYSICAL   REASON FOR ADMISSION:  Small bowel obstruction.  REFERRING PHYSICIAN:  Silvano [name]. [name], M.D., Emergency Department, [place] PHYSICIAN:  John [place] Norins, MD, [employer].  CARDIOLOGIST:  [name]. John Cancel, MD, FACC, [facility].  BRIEF HISTORY:  The patient is a 65 year old white male who presents to the emergency department with a 5-day history of abdominal pain, increasing abdominal girth, and nausea and vomiting.  The patient was seen for similar complaints 3 days ago in the office of his primary care physician.  Since that time, he has not had a normal bowel movement and has quit passing flatus.  Pain has become more severe.  He developed nausea and vomiting and presented to the emergency department.  On assessment, the patient was found on abdominal x-ray to have findings consistent with partial small bowel obstruction.  Of note, he has had a previous appendectomy, resection of Meckel's diverticulum, and lysis of adhesions for obstruction as a child and as a teenager.  General Surgery is now called for admission and management.  PAST MEDICAL HISTORY: 1. History of coronary artery disease with cardiac stent placement. 2. History of atrial fibrillation, resolved. 3. History of chronic back pain. 4. History of multiple orthopedic procedures. 5. History of abdominal surgery as noted above. 6. History of hemochromatosis.  MEDICATIONS:  The patient is unable to provide.  Will bring a list of medications for inclusion in the medical record.  ALLERGIES: 1. MORPHINE. 2. PENICILLIN.  SOCIAL HISTORY:  The patient is  accompanied by his significant other. He is engaged.  He is in Advertising account executive.  He denies tobacco use. He notes occasional alcohol use.  REVIEW OF SYSTEMS:  15-system review without significant other findings except as noted above.  FAMILY HISTORY:  Noncontributory.  PHYSICAL EXAM:  VITAL SIGNS:  Temperature 98.0, pulse 101, respirations 20, blood pressure 162/95, O2 saturation 95% on room air. HEENT:  Shows him to be normocephalic, atraumatic.  Sclerae clear. Dentition fair.  Mucous membranes moist.  Nasogastric tube has been placed. NECK:  Palpation of the neck shows no thyroid nodularity.  No lymphadenopathy.  No mass.  Well-healed surgical wound at the left angle of the mandible consistent with previous carotid resection. LUNGS:  Clear to auscultation bilaterally without rales, rhonchi, or wheeze. CARDIAC EXAM:  Shows regular rate and rhythm without significant murmur. Peripheral pulses are full. EXTREMITIES:  Nontender without significant edema. NEUROLOGICAL:  The patient is alert and oriented.  There are no gross focal deficits. ABDOMEN:  Protuberant with moderate distention.  There are bowel sounds on auscultation.  There are well-healed paramedian incisions in the right lower quadrant without evidence of herniation.  There is no significant tenderness.  LABORATORY STUDIES:  White count 9.6, hemoglobin 17.0, hematocrit 48.7%, platelet count 243,000.  There are 80% segmented neutrophils. Electrolytes  show a low sodium of 130, a low potassium of 3.3. Creatinine is normal at 0.78.  Liver function tests are slightly elevated with SGOT of 44, SGPT of 57, alkaline phosphatase of 143. Total bilirubin is normal at 0.7.  RADIOGRAPHIC STUDIES:  Two-view abdominal x-ray shows multiple fluid- filled loops of small bowel with air fluid levels.  There is scattered colonic gas and stool raising the suspicion of partial small bowel obstruction.  IMPRESSION: 1. Partial small  bowel obstruction, likely secondary to adhesions. 2. History of coronary artery disease and stent placement. 3. History of atrial fibrillation, resolved. 4. Hemochromatosis with mildly elevated liver function test.  PLAN: 1. The patient will be admitted to General Surgery service. 2. The patient will be maintained n.p.o. with nasogastric     decompression. 3. Intravenous hydration. 4. Correction of electrolytes. 5. Repeat abdominal x-ray in a.m., January 07, 2011, for followup of     small bowel obstruction. 6. Consultation to primary care physician, [name].     John Regal, MD     TMG/MEDQ  D:  01/06/2011  T:  01/06/2011  Job:  [id number]  cc:   [name]. Norins, MD [address] [id number]  [name], [place], [place] [address] [place] [address]  Electronically Signed by John Gemma MD on 01/08/2011 10:45:21 AM

## 2011-01-09 LAB — CBC
HCT: 39.1 % (ref 39.0–52.0)
Hemoglobin: 12.6 g/dL — ABNORMAL LOW (ref 13.0–17.0)
MCH: 30.7 pg (ref 26.0–34.0)
MCHC: 32.2 g/dL (ref 30.0–36.0)
MCV: 95.4 fL (ref 78.0–100.0)
Platelets: 221 10*3/uL (ref 150–400)
RBC: 4.1 MIL/uL — ABNORMAL LOW (ref 4.22–5.81)
RDW: 13.6 % (ref 11.5–15.5)
WBC: 5.8 10*3/uL (ref 4.0–10.5)

## 2011-01-09 LAB — MAGNESIUM: Magnesium: 2.2 mg/dL (ref 1.5–2.5)

## 2011-01-09 LAB — BASIC METABOLIC PANEL
BUN: 11 mg/dL (ref 6–23)
CO2: 29 mEq/L (ref 19–32)
Calcium: 8.8 mg/dL (ref 8.4–10.5)
Chloride: 112 mEq/L (ref 96–112)
Creatinine, Ser: 0.59 mg/dL (ref 0.50–1.35)
GFR calc Af Amer: >90 mL/min (ref >90)
GFR calc non Af Amer: >90 mL/min (ref >90)
Glucose, Bld: 133 mg/dL — ABNORMAL HIGH (ref 70–99)
Potassium: 4.3 mEq/L (ref 3.5–5.1)
Sodium: 147 mEq/L — ABNORMAL HIGH (ref 135–145)

## 2011-01-24 ENCOUNTER — Other Ambulatory Visit: Payer: Self-pay | Admitting: Internal Medicine

## 2011-01-24 NOTE — Telephone Encounter (Signed)
[name], are you okay with refilling his prednisone? Looks like he normally gets this per Dr Burnis Medin. Please advise, thanks!

## 2011-01-25 NOTE — Discharge Summary (Addendum)
[name], [name]            ACCOUNT NO.:  [id number]  MEDICAL RECORD NO.:  [id number]  LOCATION:  8                         FACILITY:  [facility]  PHYSICIAN:  Sammuel Hines. Daiva Nakayama, M.D. DATE OF BIRTH:  [birth year]  DATE OF ADMISSION:  01/06/2011 DATE OF DISCHARGE:  01/11/2011                              DISCHARGE SUMMARY   ADMISSION DIAGNOSES: 1. Small bowel obstruction likely secondary to adhesions. 2. History of coronary artery disease and stent placement. 3. History of atrial fibrillation, resolved. 4. History of hemochromatosis. 5. 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 6. Multiple orthopedic procedures with chronic pain issues. 7. History of anxiety. 8. History of gout.  DISCHARGE DIAGNOSES: 1. Small bowel obstruction likely secondary to adhesions. 2. History of coronary artery disease and stent placement. 3. History of atrial fibrillation, resolved. 4. History of hemochromatosis. 5. 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 6. Multiple orthopedic procedures with chronic pain issues. 7. History of anxiety. 8. History of gout.  PROCEDURES:  None.  BRIEF HISTORY:  The patient is a 65 year old gentleman who presents to the emergency room with a 5-day history of abdominal pain, increasing abdominal girth, nausea, and vomiting.  He was seen for similar complaints 3 days prior in Dr. Linda Hedges' office.  Since that time, he is not having normal bowel movements and has quit passing flatus.  He developed nausea and vomiting, presented to the emergency room.  X-ray showed findings consistent with a small bowel obstruction.  Of note, he had prior history of an appendectomy and Meckel's diverticulum and lysis of adhesions for obstruction as a child and a teenager.  He was admitted by Dr. Harlow Asa with plans to place him on bowel rest, NG decompression, and IV  hydration.  MEDICATIONS:  Multiple, please see the discharge list.  ALLERGIES:  MORPHINE and PENICILLIN.  Further history and physical, please see the dictated note.  HOSPITAL COURSE:  The patient was admitted by Dr. Harlow Asa, placed on bowel rest.  He was seen in consultation the following day by Dr. Linda Hedges who has followed him throughout his hospital course.  He made slow progress and had a fair amount of NG drainage for some time.  He was not able to be on his normal medicines.  He was placed on Solu-Medrol, Lasix, Lovenox, Protonix, and hydralazine for his blood pressure.  He was also later placed on IV Ativan for his anxiety issues.  He was slowly mobilized.  He also had some problems with ENT infection and was placed on some Augmentin by Dr. Linda Hedges.  His NG drainage improved and NG was clamped and then ultimately removed.  He was followed on a daily basis by Medicine and ultimately ready for discharge by January 11, 2011. He is being discharged home on his preadmission medicines, which includes Arava 10 mg daily, aspirin 81 mg daily, Celebrex 200 mg daily, colchicine 0.6 mg daily, Prozac 40 mg daily, Foradil b.i.d., Lasix 20 mg daily, gabapentin 300 mg one to two capsules t.i.d., hydrochlorothiazide 25 mg daily, losartan 50 mg daily, omega-3 two capsules daily, OxyContin  10 mg p.o. t.i.d., Paxil 12.5 mg daily, Percocet 1 tablet t.i.d., prednisone 5 mg daily, Protonix 40 mg daily, simvastatin 40 mg daily, Systane ophthalmic drops b.i.d., trazodone 50 mg at bedtime,Valium 5 mg t.i.d. p.r.n., Xanax 0.5 mg half tablet t.i.d. p.r.n., Xanax XR 1 mg q.a.m. and Ambien_ 12.5 mg at bedtime.  He is to follow up with primary care, Dr. Linda Hedges in 3 to 4 weeks.  He can follow up on a p.r.n. basis Dr. Harlow Asa in 2 to 3 weeks.  CONDITION ON DISCHARGE:  Improved.     Lydia Guiles, P.A.   ______________________________ Sammuel Hines. Daiva Nakayama, M.D.    WDJ/MEDQ  D:  01/11/2011  T:   01/11/2011  Job:  [id number]  cc:   Heinz Knuckles. Norins, MD [address] [address]  Electronically Signed by Earnstine Regal P.A. on 01/23/2011 01:11:43 PM Electronically Signed by Autumn Messing III M.D. on 01/25/2011 09:07:09 AM Electronically Signed by Autumn Messing III M.D. on 01/25/2011 09:07:13 AM

## 2011-01-27 NOTE — Telephone Encounter (Signed)
Refills should be thru norins or rheum as we're only going to be seeing him prn

## 2011-01-30 ENCOUNTER — Other Ambulatory Visit: Payer: Self-pay

## 2011-01-30 NOTE — Telephone Encounter (Signed)
Last filled 12/19/2010

## 2011-02-01 MED ORDER — ALPRAZOLAM ER 1 MG PO TB24: 1.0000 mg | ORAL_TABLET | ORAL | Status: DC

## 2011-02-01 NOTE — Telephone Encounter (Signed)
Ok x 5

## 2011-02-04 ENCOUNTER — Telehealth: Payer: Self-pay | Admitting: *Deleted

## 2011-02-04 MED ORDER — ALPRAZOLAM 0.5 MG PO TABS: 0.5000 mg | ORAL_TABLET | Freq: Three times a day (TID) | ORAL | Status: DC | PRN

## 2011-02-04 NOTE — Telephone Encounter (Signed)
Call from pharm requesting refill on Alprazolam 0.28m, last fill was 01/05/2011, Please Advise refills

## 2011-02-04 NOTE — Telephone Encounter (Signed)
Pt requesting refill on Alprazolam 0.5 #90 last fill was 01/05/2011, Please Advise

## 2011-02-04 NOTE — Telephone Encounter (Signed)
Ok for refill x 5 

## 2011-02-06 ENCOUNTER — Other Ambulatory Visit: Payer: Self-pay | Admitting: Internal Medicine

## 2011-02-07 NOTE — Telephone Encounter (Signed)
Dr Melvyn Novas, please advise if okay with refilling his prednisone. Last ov looks like you wanted him back only prn. Thanks!

## 2011-02-08 NOTE — Telephone Encounter (Signed)
My understanding is that [facility] is writing / controlling pred rx - if this is not the case,  Ok to refill it until he comes to see [name] NP w/in 2 weeks "with all your medications, even over the counter meds, separated in two separate bags, the ones you take no matter what vs the ones you stop once you feel better and take only as needed when you feel you need them.   [name]  will generate for you a new user friendly medication calendar that will put [place] all on the same page re: your medication use"

## 2011-02-08 NOTE — Telephone Encounter (Signed)
Pt aware to contact Dr. Kathyrn Lass for refills. Eitzen Bing, CMA

## 2011-02-08 NOTE — Telephone Encounter (Signed)
LMOMTCB

## 2011-02-12 ENCOUNTER — Telehealth: Payer: Self-pay | Admitting: *Deleted

## 2011-02-12 DIAGNOSIS — R059 Cough, unspecified: Secondary | ICD-10-CM

## 2011-02-12 DIAGNOSIS — R05 Cough: Secondary | ICD-10-CM

## 2011-02-12 MED ORDER — MOMETASONE FURO-FORMOTEROL FUM 100-5 MCG/ACT IN AERO: INHALATION_SPRAY | RESPIRATORY_TRACT | Status: DC

## 2011-02-12 NOTE — Telephone Encounter (Signed)
Ok to take [place]. We can also authorize refill

## 2011-02-12 NOTE — Telephone Encounter (Signed)
Prescription faxed to [facility], pt informed and will call PRN

## 2011-02-12 NOTE — Telephone Encounter (Signed)
Pt is requesting a refill from you on his Dulera, I see where pt normally get medication filled by Dr Melvyn Novas. Pt states he is having slight chest congestion and did not know if this would harm or help with this medication. Please Advise

## 2011-02-21 ENCOUNTER — Other Ambulatory Visit: Payer: Self-pay | Admitting: Internal Medicine

## 2011-03-07 ENCOUNTER — Other Ambulatory Visit: Payer: Self-pay | Admitting: Internal Medicine

## 2011-04-10 ENCOUNTER — Observation Stay (HOSPITAL_COMMUNITY)
Admission: AD | Admit: 2011-04-10 | Discharge: 2011-04-11 | Disposition: A | Discharge status: Home / Self Care | Payer: 59 | Source: Ambulatory Visit | Attending: Internal Medicine | Admitting: Internal Medicine

## 2011-04-10 ENCOUNTER — Telehealth: Payer: Self-pay | Admitting: *Deleted

## 2011-04-10 ENCOUNTER — Observation Stay (HOSPITAL_COMMUNITY): Payer: 59

## 2011-04-10 ENCOUNTER — Ambulatory Visit (INDEPENDENT_AMBULATORY_CARE_PROVIDER_SITE_OTHER): Payer: 59 | Admitting: Internal Medicine

## 2011-04-10 VITALS — BP 138/80 | HR 120 | Temp 99.1°F

## 2011-04-10 DIAGNOSIS — R109 Unspecified abdominal pain: Secondary | ICD-10-CM | POA: Insufficient documentation

## 2011-04-10 DIAGNOSIS — M549 Dorsalgia, unspecified: Secondary | ICD-10-CM | POA: Insufficient documentation

## 2011-04-10 DIAGNOSIS — K5289 Other specified noninfective gastroenteritis and colitis: Principal | ICD-10-CM | POA: Insufficient documentation

## 2011-04-10 DIAGNOSIS — K56609 Unspecified intestinal obstruction, unspecified as to partial versus complete obstruction: Secondary | ICD-10-CM | POA: Diagnosis present

## 2011-04-10 DIAGNOSIS — IMO0001 Reserved for inherently not codable concepts without codable children: Secondary | ICD-10-CM | POA: Insufficient documentation

## 2011-04-10 DIAGNOSIS — R143 Flatulence: Secondary | ICD-10-CM | POA: Insufficient documentation

## 2011-04-10 DIAGNOSIS — F419 Anxiety disorder, unspecified: Secondary | ICD-10-CM | POA: Diagnosis present

## 2011-04-10 DIAGNOSIS — F411 Generalized anxiety disorder: Secondary | ICD-10-CM | POA: Insufficient documentation

## 2011-04-10 DIAGNOSIS — I4891 Unspecified atrial fibrillation: Secondary | ICD-10-CM

## 2011-04-10 DIAGNOSIS — R142 Eructation: Secondary | ICD-10-CM | POA: Insufficient documentation

## 2011-04-10 DIAGNOSIS — D72829 Elevated white blood cell count, unspecified: Secondary | ICD-10-CM | POA: Insufficient documentation

## 2011-04-10 DIAGNOSIS — R0602 Shortness of breath: Secondary | ICD-10-CM | POA: Insufficient documentation

## 2011-04-10 DIAGNOSIS — M2540 Effusion, unspecified joint: Secondary | ICD-10-CM | POA: Insufficient documentation

## 2011-04-10 DIAGNOSIS — I10 Essential (primary) hypertension: Secondary | ICD-10-CM | POA: Diagnosis present

## 2011-04-10 DIAGNOSIS — I2510 Atherosclerotic heart disease of native coronary artery without angina pectoris: Secondary | ICD-10-CM | POA: Insufficient documentation

## 2011-04-10 DIAGNOSIS — G8929 Other chronic pain: Secondary | ICD-10-CM | POA: Insufficient documentation

## 2011-04-10 DIAGNOSIS — R112 Nausea with vomiting, unspecified: Secondary | ICD-10-CM

## 2011-04-10 DIAGNOSIS — R509 Fever, unspecified: Secondary | ICD-10-CM | POA: Insufficient documentation

## 2011-04-10 DIAGNOSIS — Z860100 Personal history of colon polyps, unspecified: Secondary | ICD-10-CM | POA: Insufficient documentation

## 2011-04-10 DIAGNOSIS — M159 Polyosteoarthritis, unspecified: Secondary | ICD-10-CM | POA: Diagnosis present

## 2011-04-10 DIAGNOSIS — R61 Generalized hyperhidrosis: Secondary | ICD-10-CM | POA: Insufficient documentation

## 2011-04-10 DIAGNOSIS — M069 Rheumatoid arthritis, unspecified: Secondary | ICD-10-CM | POA: Diagnosis present

## 2011-04-10 DIAGNOSIS — J3489 Other specified disorders of nose and nasal sinuses: Secondary | ICD-10-CM | POA: Insufficient documentation

## 2011-04-10 DIAGNOSIS — R141 Gas pain: Secondary | ICD-10-CM | POA: Insufficient documentation

## 2011-04-10 LAB — EXPECTORATED SPUTUM ASSESSMENT W REFEX TO RESP CULTURE: Special Requests: NORMAL

## 2011-04-10 LAB — CBC
HCT: 40 % (ref 39.0–52.0)
Hemoglobin: 13.8 g/dL (ref 13.0–17.0)
MCH: 31.6 pg (ref 26.0–34.0)
MCHC: 34.5 g/dL (ref 30.0–36.0)
MCV: 91.5 fL (ref 78.0–100.0)
Platelets: 206 10*3/uL (ref 150–400)
RBC: 4.37 MIL/uL (ref 4.22–5.81)
RDW: 13.2 % (ref 11.5–15.5)
WBC: 12.6 10*3/uL — ABNORMAL HIGH (ref 4.0–10.5)

## 2011-04-10 LAB — DIFFERENTIAL
Basophils Absolute: 0 10*3/uL (ref 0.0–0.1)
Basophils Relative: 0 % (ref 0–1)
Eosinophils Absolute: 0 10*3/uL (ref 0.0–0.7)
Eosinophils Relative: 0 % (ref 0–5)
Lymphocytes Relative: 7 % — ABNORMAL LOW (ref 12–46)
Lymphs Abs: 0.8 10*3/uL (ref 0.7–4.0)
Monocytes Absolute: 0.9 10*3/uL (ref 0.1–1.0)
Monocytes Relative: 7 % (ref 3–12)
Neutro Abs: 10.8 10*3/uL — ABNORMAL HIGH (ref 1.7–7.7)
Neutrophils Relative %: 86 % — ABNORMAL HIGH (ref 43–77)

## 2011-04-10 LAB — BASIC METABOLIC PANEL
BUN: 10 mg/dL (ref 6–23)
CO2: 30 mEq/L (ref 19–32)
Calcium: 9 mg/dL (ref 8.4–10.5)
Chloride: 88 mEq/L — ABNORMAL LOW (ref 96–112)
Creatinine, Ser: 0.65 mg/dL (ref 0.50–1.35)
GFR calc Af Amer: >90 mL/min (ref >90)
GFR calc non Af Amer: >90 mL/min (ref >90)
Glucose, Bld: 121 mg/dL — ABNORMAL HIGH (ref 70–99)
Potassium: 3.4 mEq/L — ABNORMAL LOW (ref 3.5–5.1)
Sodium: 127 mEq/L — ABNORMAL LOW (ref 135–145)

## 2011-04-10 LAB — EXPECTORATED SPUTUM ASSESSMENT W GRAM STAIN, RFLX TO RESP C

## 2011-04-10 MED ORDER — PANTOPRAZOLE SODIUM 40 MG IV SOLR
40.0000 mg | INTRAVENOUS | Status: DC
  Administered 2011-04-10: 40 mg via INTRAVENOUS
  Filled 2011-04-10 (×2): qty 40

## 2011-04-10 MED ORDER — ONDANSETRON HCL 4 MG/2ML IJ SOLN
4.0000 mg | Freq: Four times a day (QID) | INTRAMUSCULAR | Status: DC | PRN
  Administered 2011-04-10: 4 mg via INTRAVENOUS
  Filled 2011-04-10: qty 2

## 2011-04-10 MED ORDER — METHYLPREDNISOLONE SODIUM SUCC 40 MG IJ SOLR
40.0000 mg | Freq: Two times a day (BID) | INTRAMUSCULAR | Status: DC
  Administered 2011-04-10 – 2011-04-11 (×3): 40 mg via INTRAVENOUS
  Filled 2011-04-10 (×5): qty 1

## 2011-04-10 MED ORDER — FLUTICASONE PROPIONATE 50 MCG/ACT NA SUSP
2.0000 | Freq: Every day | NASAL | Status: DC
  Administered 2011-04-10 – 2011-04-11 (×2): 2 via NASAL
  Filled 2011-04-10: qty 16

## 2011-04-10 MED ORDER — HYDROMORPHONE HCL PF 2 MG/ML IJ SOLN
1.0000 mg | INTRAMUSCULAR | Status: DC
  Administered 2011-04-10 – 2011-04-11 (×2): 1 mg via INTRAVENOUS
  Filled 2011-04-10 (×2): qty 1

## 2011-04-10 MED ORDER — FUROSEMIDE 10 MG/ML IJ SOLN
20.0000 mg | Freq: Every day | INTRAMUSCULAR | Status: DC
  Administered 2011-04-10: 20 mg via INTRAVENOUS
  Filled 2011-04-10 (×2): qty 2

## 2011-04-10 MED ORDER — ENOXAPARIN SODIUM 40 MG/0.4ML ~~LOC~~ SOLN
40.0000 mg | SUBCUTANEOUS | Status: DC
  Administered 2011-04-10: 40 mg via SUBCUTANEOUS
  Filled 2011-04-10 (×3): qty 0.4

## 2011-04-10 MED ORDER — SALMETEROL XINAFOATE 50 MCG/DOSE IN AEPB
1.0000 | INHALATION_SPRAY | Freq: Two times a day (BID) | RESPIRATORY_TRACT | Status: DC
  Administered 2011-04-10 – 2011-04-11 (×2): 1 via RESPIRATORY_TRACT
  Filled 2011-04-10: qty 0

## 2011-04-10 MED ORDER — HYDRALAZINE HCL 20 MG/ML IJ SOLN: 10.0000 mg | Freq: Four times a day (QID) | INTRAMUSCULAR | Status: DC | PRN

## 2011-04-10 MED ORDER — SODIUM CHLORIDE 0.45 % IV SOLN
INTRAVENOUS | Status: DC
  Administered 2011-04-10: 23:00:00 via INTRAVENOUS

## 2011-04-10 MED ORDER — LORAZEPAM 2 MG/ML IJ SOLN: 0.5000 mg | Freq: Four times a day (QID) | INTRAMUSCULAR | Status: DC | PRN

## 2011-04-10 MED ORDER — MOXIFLOXACIN HCL IN NACL 400 MG/250ML IV SOLN
400.0000 mg | INTRAVENOUS | Status: DC
  Administered 2011-04-10: 400 mg via INTRAVENOUS
  Filled 2011-04-10 (×2): qty 250

## 2011-04-10 NOTE — Progress Notes (Signed)
Subjective:    Patient ID: [name], male    DOB: [birth year], 66 y.o.   MRN: [id number]  HPI Comments: [name] called today to report abdominal pain along with nausea and vomiting. This is similar to symptoms he has had in the past with SBO. He has not been able to keep down food, fluids or medications. He is feeling light-headed and weak. In addition, he has had a low grade fever, cough that is productive of thick, dark mucus and mild shortness of breath. 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  Past Medical History  Diagnosis Date  . Allergy     rhinitis  . Hx of colonic polyps   . Diverticulitis   . Hypertension   . Osteoarthritis   . 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 CAD (coronary artery disease)     minimal coronary plaque in the LAD and right coronary system. 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 7-06. Last nuclear stress 11-17-06 fixed anterior/ inferior defect, no inducible ischemia, EF 81%  . Allergic rhinitis   . Hx of colonoscopy    Past Surgical History  Procedure Date  . Appendectomy   . Inguinal hernia repair 2008    Right, left remotely  . Total knee arthroplasty     right knee partial 2002, total left 2000  . Tonsillectomy   . Shoulder preplacement 2008    left, partial  . Ptca w/ coated stent lad     Cx secondary to tear  . Foot surgery 11-08    for removal of bone spurs- right foot  . Reconstruction of ankle 6-09    Right- [facility]  . L4-5 diskectomy w/ fusion, cage placement and rods 12-10    [place]   Family History  Problem Relation Age of Onset  . Cancer Mother     uterine/ survivor  . Macular degeneration Mother   . Other Mother     ankle edema  . Coronary artery disease Father   . Hypertension Father   . Cancer Father     prostate  .  Thyroid disease Sister   . Coronary artery disease Brother   . Heart attack Brother   . Hyperlipidemia Brother   . Other Brother     Schizophrenic  . Diabetes Neg Hx   . Coronary artery disease Other   . Heart attack Other    History   Social History  . Marital Status: Married    Spouse Name: N/A    Number of Children: 2  . Years of Education: N/A   Occupational History  . OWNER & SALES    Social History Main Topics  . Smoking status: Never Smoker   . Smokeless tobacco: Never Used  . Alcohol Use: 1.5 - 2.0 oz/week    3-4 drink(s) per week  . Drug Use: No  . Sexually Active: Not on file   Other Topics Concern  . Not on file   Social History Narrative  . [employer] - BA Married 1972-54yr seperated/divorced. 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 Very busy life- some stress. During '11 discovered an [employer]- $500,000+ loss. Is handling this OK - will keep the  business running.        Review of Systems  Constitutional: Positive for fever, diaphoresis and appetite change. Negative for activity change.  HENT: Positive for congestion and postnasal drip. Negative for nosebleeds, facial swelling, neck pain and ear discharge.   Eyes: Negative.   Respiratory: Positive for cough, chest tightness and shortness of breath. Negative for wheezing.   Cardiovascular: Negative.   Gastrointestinal: Positive for nausea, vomiting, abdominal pain and abdominal distention. Negative for diarrhea, blood in stool and anal bleeding.  Genitourinary: Negative.   Musculoskeletal: Positive for myalgias, back pain, joint swelling and arthralgias.  Skin: Negative.   Neurological: Negative.   Hematological: Negative.   Psychiatric/Behavioral: Negative.        Objective:   Physical Exam  Constitutional: He is oriented to person, place, and time. He appears  well-developed and well-nourished.  HENT:  Head: Normocephalic and atraumatic.  Right Ear: External ear normal.  Left Ear: External ear normal.       Dry mucus membranes  Eyes: Conjunctivae and EOM are normal. Pupils are equal, round, and reactive to light.  Neck: Neck supple. No JVD present. No tracheal deviation present. No thyromegaly present.  Cardiovascular: Normal rate, regular rhythm and normal heart sounds.   Pulmonary/Chest: No respiratory distress. He has no wheezes. He has no rales. He exhibits no tenderness.       Wet, productive cough  Abdominal: He exhibits distension. There is no hepatomegaly. There is generalized tenderness. There is no rigidity, no rebound and no guarding.       Absent bowel sounds  Genitourinary:       deferred  Musculoskeletal:       No synovial thickening small joints, decreased ROM shoulder, normal gait  Lymphadenopathy:    He has no cervical adenopathy.  Neurological: He is alert and oriented to person, place, and time. Coordination normal.  Skin: Skin is warm and dry. No rash noted.  Psychiatric: He has a normal mood and affect. His behavior is normal.          Assessment & Plan:  1. GI - Patient with a h/o SBO secondary to adhesions from multiple abdominal surgeries. He presents today with N/V, absent BS on exam with diffuse tenderness all c/w recurrent SBO Plan - observation admission            Acute abdominal series - r/o SBO           If SBO confirmed will place NG for comfort and decompression.           IV ondansterone for nausea  2. OA/RA - patient with significant arthritis who has been o n immunologic therapy Plan - will covert prednisone to IV solumedrol while NPO  3. ID/Pulm - probable bronchitis with productive cough and fever. Plan - acute abdominal series           Avelox 400 mg IV q24           Continue long acting bronchodilator therapy  4. Chronic pain - due to hemochromatosis and multiple joint involvement along  with RA/OA on Oxycontin 10 mg tid at home with percocet 5/325 for breakthrough pain Plan - dilaudid 1 mg q 3 for pain control.  4.

## 2011-04-10 NOTE — Telephone Encounter (Signed)
Pt is coming in today at 4 pm

## 2011-04-10 NOTE — Telephone Encounter (Signed)
Pt c/o  Continuous vomitting since last night, he denies fever, diarrhea or abdominal pain. Pt states symptoms started last night denies any changes in Bowel movements. Pt would like to be seen today. Please advise.

## 2011-04-11 MED ORDER — GABAPENTIN 600 MG PO TABS: 300.0000 mg | ORAL_TABLET | Freq: Three times a day (TID) | ORAL | Status: DC

## 2011-04-11 MED ORDER — FUROSEMIDE 20 MG PO TABS
20.0000 mg | ORAL_TABLET | Freq: Every day | ORAL | Status: DC
  Administered 2011-04-11: 20 mg via ORAL
  Filled 2011-04-11 (×3): qty 1

## 2011-04-11 MED ORDER — OXYCODONE HCL 10 MG PO TB12
10.0000 mg | ORAL_TABLET | Freq: Two times a day (BID) | ORAL | Status: DC
  Administered 2011-04-11 (×2): 10 mg via ORAL
  Filled 2011-04-11 (×2): qty 1

## 2011-04-11 MED ORDER — COLCHICINE 0.6 MG PO TABS
0.6000 mg | ORAL_TABLET | Freq: Every day | ORAL | Status: DC
  Administered 2011-04-11: 0.6 mg via ORAL
  Filled 2011-04-11 (×2): qty 1

## 2011-04-11 MED ORDER — SIMVASTATIN 20 MG PO TABS
20.0000 mg | ORAL_TABLET | Freq: Every day | ORAL | Status: DC
  Administered 2011-04-11: 20 mg via ORAL
  Filled 2011-04-11 (×2): qty 1

## 2011-04-11 MED ORDER — GABAPENTIN 300 MG PO CAPS
300.0000 mg | ORAL_CAPSULE | Freq: Three times a day (TID) | ORAL | Status: DC
  Administered 2011-04-11 (×3): 300 mg via ORAL
  Filled 2011-04-11 (×6): qty 1

## 2011-04-11 MED ORDER — HYDROMORPHONE HCL PF 2 MG/ML IJ SOLN: 1.0000 mg | INTRAMUSCULAR | Status: DC | PRN

## 2011-04-11 MED ORDER — OXYCODONE-ACETAMINOPHEN 5-325 MG PO TABS
1.0000 | ORAL_TABLET | ORAL | Status: DC | PRN
  Administered 2011-04-11 (×2): 1 via ORAL
  Filled 2011-04-11 (×2): qty 1

## 2011-04-11 MED ORDER — PANTOPRAZOLE SODIUM 40 MG PO TBEC
40.0000 mg | DELAYED_RELEASE_TABLET | Freq: Every day | ORAL | Status: DC
  Administered 2011-04-11: 40 mg via ORAL
  Filled 2011-04-11 (×2): qty 1

## 2011-04-11 MED ORDER — LOSARTAN POTASSIUM 50 MG PO TABS
50.0000 mg | ORAL_TABLET | Freq: Every day | ORAL | Status: DC
  Administered 2011-04-11: 50 mg via ORAL
  Filled 2011-04-11 (×2): qty 1

## 2011-04-11 MED ORDER — PAROXETINE HCL ER 12.5 MG PO TB24
12.5000 mg | ORAL_TABLET | Freq: Every day | ORAL | Status: DC
  Administered 2011-04-11: 12.5 mg via ORAL
  Filled 2011-04-11 (×2): qty 1

## 2011-04-11 MED ORDER — ALPRAZOLAM ER 1 MG PO TB24
1.0000 mg | ORAL_TABLET | Freq: Every day | ORAL | Status: DC
  Administered 2011-04-11: 1 mg via ORAL
  Filled 2011-04-11: qty 1

## 2011-04-11 MED ORDER — MOXIFLOXACIN HCL 400 MG PO TABS: 400.0000 mg | ORAL_TABLET | Freq: Every day | ORAL | Status: AC

## 2011-04-11 MED ORDER — TRAZODONE HCL 50 MG PO TABS
50.0000 mg | ORAL_TABLET | Freq: Every day | ORAL | Status: DC
  Administered 2011-04-11: 50 mg via ORAL
  Filled 2011-04-11 (×2): qty 1

## 2011-04-11 MED ORDER — CELECOXIB 200 MG PO CAPS
200.0000 mg | ORAL_CAPSULE | Freq: Every day | ORAL | Status: DC
  Administered 2011-04-11: 200 mg via ORAL
  Filled 2011-04-11 (×2): qty 1

## 2011-04-11 MED ORDER — TAMSULOSIN HCL 0.4 MG PO CAPS
0.4000 mg | ORAL_CAPSULE | Freq: Every day | ORAL | Status: DC
  Administered 2011-04-11: 0.4 mg via ORAL
  Filled 2011-04-11 (×2): qty 1

## 2011-04-11 NOTE — Plan of Care (Signed)
Problem: Discharge Progression Outcomes Goal: Activity appropriate for discharge plan Outcome: Adequate for Discharge Gave discharge instructions to patient and wife, acknowledged understanding.

## 2011-04-11 NOTE — Progress Notes (Signed)
Patient has had a good day: tolerated clear liquid diet, no N/V, has had 2 good BMs.  Plan - D/C home           Will continue avelox 400 mg qd x 4 days for bronchitis  Dictation # [id number]

## 2011-04-11 NOTE — Progress Notes (Signed)
Subjective: Still having some nausea but no emesis. He does report continued cough.  Objective: Lab:  CBC    Component Value Date/Time   WBC 12.6* 04/10/2011 1925   RBC 4.37 04/10/2011 1925   HGB 13.8 04/10/2011 1925   HCT 40.0 04/10/2011 1925   PLT 206 04/10/2011 1925   MCV 91.5 04/10/2011 1925   MCH 31.6 04/10/2011 1925   MCHC 34.5 04/10/2011 1925   RDW 13.2 04/10/2011 1925   LYMPHSABS 0.8 04/10/2011 1925   MONOABS 0.9 04/10/2011 1925   EOSABS 0.0 04/10/2011 1925   BASOSABS 0.0 04/10/2011 1925    BMET    Component Value Date/Time   NA 127* 04/10/2011 1925   K 3.4* 04/10/2011 1925   CL 88* 04/10/2011 1925   CO2 30 04/10/2011 1925   GLUCOSE 121* 04/10/2011 1925   BUN 10 04/10/2011 1925   CREATININE 0.65 04/10/2011 1925   CALCIUM 9.0 04/10/2011 1925   GFRNONAA >90 04/10/2011 1925   GFRAA >90 04/10/2011 1925      Imaging: KUB - no sign of obstruction or ileus   Physical Exam: Filed Vitals:   04/11/11 0620  BP: 103/67  Pulse: 68  Temp: 98.1 F (36.7 C)  Resp: 18   Gen'l - WNWD, no acute distress Pulm - good breath sounds, no increased WOB Cor - 2+ radial pulse, RRR Abd- BS+ x 4, soft, no guarding or rebound    Assessment/Plan: 1. GI- despite clinical exam - normal KUB. This AM he does have BS Plan - continue IVF           Resume home meds           Clear liquid and advance as tolerated  3. ID/Pulm - mild leuocytosis. Single view CXR w/o infiltrate. Probable bronchitis. Day # 2 avelox Plan - will continue avelox and breathing treatments  4. Chronic pain - will resume home meds. D/C dilaudid  5. Anxiety - will resume paxil CR and d/c ativan  6. HTN- stable - will resume home meds.   Dispo - hopefully hoe later today.    [name] 04/11/2011, 7:23 AM

## 2011-04-12 NOTE — Discharge Summary (Signed)
NAMEBERNARDINO, DOWELL NO.:  [id number]  MEDICAL RECORD NO.:  [id number]  LOCATION:  0539                         FACILITY:  [facility]  PHYSICIAN:  Heinz Knuckles. Jessah Danser, MD  DATE OF BIRTH:  07/13/1945  DATE OF ADMISSION:  04/10/2011 DATE OF DISCHARGE:  04/11/2011                              DISCHARGE SUMMARY   ADMITTING DIAGNOSES:  Nausea, vomiting, question of small bowel obstruction.  DISCHARGE DIAGNOSIS:  Gastroenteritis with no evidence of small bowel obstruction.  HISTORY OF PRESENT ILLNESS:  Mr. Vanosdol presented to the office with a 24-hour history of progressive abdominal pain with nausea and vomiting. His symptoms were similar to the past small bowel obstructions.  The patient had not been able to keep down food, fluids, or medications.  At his presentation to the office, he was lightheaded and weak.  In addition, he had a low-grade fever and a cough that was productive of a thick dark mucus, accompanied by mild shortness of breath.  The patient was admitted on 24-hour observation for potential recurrent small bowel obstruction, for IV fluids.  Please see the progress note,  April 10, 2011, which needs to be relabeled H and P for past medical history, family history, social history, details of admission exam.  HOSPITAL COURSE: 1. GI.  The patient was admitted to the hospital.  A KUB was negative     for small bowel obstruction or ileus.  The patient was continued on     IV medications for the most part, overnight.  In the morning of day     of discharge, the patient was feeling much better.  He had flatus.     His abdominal pain had resolved.  The patient had no recurrent     episodes of nausea or vomiting.  Through the course of the day, the     patient was able to keep down and tolerated clear liquid diet.  The     patient reports he had 2 good bowel movements.  At the time of     discharge examination, his abdomen was soft, he was feeling well,  and felt able to be discharged to home.  PHYSICAL EXAMINATION:  VITAL SIGNS:  On discharge, temperature was 97.6, blood pressure 109/72, pulse was 74, respirations 18, and O2 saturations 93%. GENERAL APPEARANCE:  This is a well-nourished and well-developed, mildly overweight, Caucasian male, lying in bed, is in good spirits, in no distress. HEENT:  Unremarkable.  Conjunctivae, sclerae were clear.  CHEST:  Clear with no rales, wheezes, or rhonchi.  CARDIOVASCULAR:  2+ radial pulse. NECK:  He had no JVD or carotid bruits. ABDOMEN:  Soft.  He had positive bowel sounds.  There was no guarding or rebound.  No tenderness to exam. NEUROLOGIC:  Patient is alert and oriented to person, place, time, and context.  DISPOSITION:  The patient is ready for discharge home.  He will resume all his home medications.  The patient was thought to have a bronchitis. Acute abdominal series did not reveal any infiltrates or other acute pulmonary process.  The patient will continue on Avelox 400 mg for an additional 4 days.  The patient is to be discharged home.  He will be seen in the office in followup on an as-needed basis.  Patient's condition at time of discharge dictation is stable and improved.     Heinz Knuckles Senie Lanese, MD     MEN/MEDQ  D:  04/11/2011  T:  04/12/2011  Job:  [id number]

## 2011-04-13 LAB — CULTURE, RESPIRATORY

## 2011-04-13 LAB — CULTURE, RESPIRATORY W GRAM STAIN

## 2011-04-13 NOTE — Progress Notes (Signed)
  Subjective:    Patient ID: [name], male    DOB: 1945-10-13, 66 y.o.   MRN: [id number]  HPI Presents with abdominal pain, N/V similar to previous SBO symptoms. Patient admitted - see H&P   Review of Systems     Objective:   Physical Exam        Assessment & Plan:

## 2011-04-22 ENCOUNTER — Other Ambulatory Visit: Payer: Self-pay | Admitting: *Deleted

## 2011-04-22 MED ORDER — OXYCODONE HCL 10 MG PO TB12: 10.0000 mg | ORAL_TABLET | Freq: Three times a day (TID) | ORAL | Status: DC | PRN

## 2011-04-22 MED ORDER — OXYCODONE-ACETAMINOPHEN 5-325 MG PO TABS: 1.0000 | ORAL_TABLET | Freq: Four times a day (QID) | ORAL | Status: DC | PRN

## 2011-05-03 ENCOUNTER — Telehealth: Payer: Self-pay | Admitting: *Deleted

## 2011-05-03 MED ORDER — PANTOPRAZOLE SODIUM 40 MG PO TBEC: 40.0000 mg | DELAYED_RELEASE_TABLET | Freq: Two times a day (BID) | ORAL | Status: DC

## 2011-05-03 MED ORDER — OSELTAMIVIR PHOSPHATE 75 MG PO CAPS: 75.0000 mg | ORAL_CAPSULE | Freq: Every day | ORAL | Status: AC

## 2011-05-03 NOTE — Telephone Encounter (Signed)
Per 01.24.13 note concerning son HENDERSON FRAMPTON having flu], Dr Linda Hedges VO to have household contacts take Tamiflu [1x10 days] also. Patient informed.

## 2011-05-29 ENCOUNTER — Other Ambulatory Visit: Payer: Self-pay | Admitting: Internal Medicine

## 2011-06-10 ENCOUNTER — Other Ambulatory Visit: Payer: Self-pay | Admitting: Internal Medicine

## 2011-06-25 ENCOUNTER — Other Ambulatory Visit: Payer: Self-pay

## 2011-06-25 MED ORDER — CELECOXIB 200 MG PO CAPS: 200.0000 mg | ORAL_CAPSULE | Freq: Two times a day (BID) | ORAL | Status: DC

## 2011-07-11 ENCOUNTER — Ambulatory Visit (INDEPENDENT_AMBULATORY_CARE_PROVIDER_SITE_OTHER): Payer: 59 | Admitting: Internal Medicine

## 2011-07-11 ENCOUNTER — Encounter: Payer: Self-pay | Admitting: Internal Medicine

## 2011-07-11 VITALS — BP 84/64 | HR 92 | Temp 97.8°F | Resp 16 | Wt 167.8 lb

## 2011-07-11 DIAGNOSIS — Z86010 Personal history of colonic polyps: Secondary | ICD-10-CM

## 2011-07-11 DIAGNOSIS — J209 Acute bronchitis, unspecified: Secondary | ICD-10-CM

## 2011-07-11 MED ORDER — OXYCODONE HCL 10 MG PO TB12: 10.0000 mg | ORAL_TABLET | Freq: Three times a day (TID) | ORAL | Status: DC | PRN

## 2011-07-11 MED ORDER — SULFAMETHOXAZOLE-TRIMETHOPRIM 800-160 MG PO TABS: 1.0000 | ORAL_TABLET | Freq: Two times a day (BID) | ORAL | Status: AC

## 2011-07-11 MED ORDER — OXYCODONE-ACETAMINOPHEN 5-325 MG PO TABS: 1.0000 | ORAL_TABLET | Freq: Four times a day (QID) | ORAL | Status: DC | PRN

## 2011-07-11 MED ORDER — PROMETHAZINE-CODEINE 6.25-10 MG/5ML PO SYRP: 5.0000 mL | ORAL_SOLUTION | Freq: Four times a day (QID) | ORAL | Status: AC | PRN

## 2011-07-11 NOTE — Patient Instructions (Signed)
Lower respiratory infection - sound like it is centered in the right lower lobe. Plan - septra DS twice a day for 7 days; promethazine with codiene cough syrup 1 tsp every 6 hours as needed and two (2) tsp at bedtime.           Supportive care  Acute Bronchitis You have acute bronchitis. This means you have a chest cold. The airways in your lungs are red and sore (inflamed). Acute means it is sudden onset.   CAUSES Bronchitis is most often caused by the same virus that causes a cold. SYMPTOMS    Body aches.   Chest congestion.   Chills.   Cough.   Fever.   Shortness of breath.   Sore throat.  TREATMENT   Acute bronchitis is usually treated with rest, fluids, and medicines for relief of fever or cough. Most symptoms should go away after a few days or a week. Increased fluids may help thin your secretions and will prevent dehydration. Your caregiver may give you an inhaler to improve your symptoms. The inhaler reduces shortness of breath and helps control cough. You can take over-the-counter pain relievers or cough medicine to decrease coughing, pain, or fever. A cool-air vaporizer may help thin bronchial secretions and make it easier to clear your chest. Antibiotics are usually not needed but can be prescribed if you smoke, are seriously ill, have chronic lung problems, are elderly, or you are at higher risk for developing complications. Allergies and asthma can make bronchitis worse. Repeated episodes of bronchitis may cause longstanding lung problems. Avoid smoking and secondhand smoke. Exposure to cigarette smoke or irritating chemicals will make bronchitis worse. If you are a cigarette smoker, consider using nicotine gum or skin patches to help control withdrawal symptoms. Quitting smoking will help your lungs heal faster. Recovery from bronchitis is often slow, but you should start feeling better after 2 to 3 days. Cough from bronchitis frequently lasts for 3 to 4 weeks. To prevent  another bout of acute bronchitis:  Quit smoking.   Wash your hands frequently to get rid of viruses or use a hand sanitizer.   Avoid other people with cold or virus symptoms.   Try not to touch your hands to your mouth, nose, or eyes.  SEEK IMMEDIATE MEDICAL CARE IF:  You develop increased fever, chills, or chest pain.   You have severe shortness of breath or bloody sputum.   You develop dehydration, fainting, repeated vomiting, or a severe headache.   You have no improvement after 1 week of treatment or you get worse.  MAKE SURE YOU:    Understand these instructions.   Will watch your condition.   Will get help right away if you are not doing well or get worse.  Document Released: 05/02/2004 Document Revised: 03/14/2011 Document Reviewed: 07/18/2010 [facility] Patient Information 2012 [place].

## 2011-07-11 NOTE — Progress Notes (Signed)
  Subjective:    Patient ID: [name], male    DOB: 11-Nov-1945, 66 y.o.   MRN: [id number]  HPI [name] presents with a 2-3 day h/o head congestion, cough productive of a green sputum, mild shortness of breath. He feels sick.  PMH, FamHx and SocHx reviewed for any changes and relevance.    Review of Systems System review is negative for any constitutional, cardiac, pulmonary, GI or neuro symptoms or complaints other than as described in the HPI.     Objective:   Physical Exam Filed Vitals:   07/11/11 1612  BP: 84/64  Pulse: 92  Temp: 97.8 F (36.6 C)  Resp: 16   Repeat BP my MD - left arm 104/60  Gen'l0- WNWD white man in no acute distress HEENT- no sinus tenderness,  Cor - RRR Pulm - no increased work of breathing, decrease breath sounds at right base, positive egophony but negative petrilogouy         Assessment & Plan:  Lower respiratory infection.  Plan - Septra DS bid x 7           Prom/cod 1 tsp q 6 - 2 tsp qHS           Supportive care.

## 2011-07-16 ENCOUNTER — Encounter: Payer: Self-pay | Admitting: Gastroenterology

## 2011-07-16 ENCOUNTER — Other Ambulatory Visit: Payer: Self-pay | Admitting: Internal Medicine

## 2011-07-22 ENCOUNTER — Telehealth: Payer: Self-pay | Admitting: *Deleted

## 2011-07-22 NOTE — Telephone Encounter (Signed)
He needs to be seen

## 2011-07-22 NOTE — Telephone Encounter (Signed)
Forwarded md response to scheduler for ov/ssd

## 2011-07-22 NOTE — Telephone Encounter (Signed)
Patient states not any better from being treated with acute bronchitis on 07/11/2011. Cough is worse at nite and mucus is white/green again. Feels weak. Nose congestion. Has taken all medications as ordered.. Please advise. SSD 07/22/2011. 2:39 pm

## 2011-07-23 ENCOUNTER — Ambulatory Visit (INDEPENDENT_AMBULATORY_CARE_PROVIDER_SITE_OTHER)
Admission: RE | Admit: 2011-07-23 | Discharge: 2011-07-23 | Disposition: A | Discharge status: Home / Self Care | Payer: 59 | Source: Ambulatory Visit | Attending: Internal Medicine | Admitting: Internal Medicine

## 2011-07-23 ENCOUNTER — Encounter: Payer: Self-pay | Admitting: Internal Medicine

## 2011-07-23 ENCOUNTER — Ambulatory Visit (INDEPENDENT_AMBULATORY_CARE_PROVIDER_SITE_OTHER): Payer: 59 | Admitting: Internal Medicine

## 2011-07-23 VITALS — BP 122/88 | HR 85 | Temp 98.3°F | Resp 16 | Ht 67.0 in | Wt 164.0 lb

## 2011-07-23 DIAGNOSIS — R059 Cough, unspecified: Secondary | ICD-10-CM

## 2011-07-23 DIAGNOSIS — R05 Cough: Secondary | ICD-10-CM

## 2011-07-23 DIAGNOSIS — Z23 Encounter for immunization: Secondary | ICD-10-CM

## 2011-07-23 DIAGNOSIS — J441 Chronic obstructive pulmonary disease with (acute) exacerbation: Secondary | ICD-10-CM

## 2011-07-23 MED ORDER — MOXIFLOXACIN HCL 400 MG PO TABS: 400.0000 mg | ORAL_TABLET | Freq: Every day | ORAL | Status: AC

## 2011-07-23 MED ORDER — HYDROCOD POLST-CPM POLST ER 10-8 MG PO CP12: 1.0000 | ORAL_CAPSULE | Freq: Two times a day (BID) | ORAL | Status: DC | PRN

## 2011-07-23 MED ORDER — ROFLUMILAST 500 MCG PO TABS: 500.0000 ug | ORAL_TABLET | Freq: Every day | ORAL | Status: DC

## 2011-07-23 MED ORDER — METHYLPREDNISOLONE ACETATE 80 MG/ML IJ SUSP
120.0000 mg | Freq: Once | INTRAMUSCULAR | Status: AC
  Administered 2011-07-23: 120 mg via INTRAMUSCULAR

## 2011-07-23 NOTE — Progress Notes (Signed)
  Subjective:    Patient ID: [name], male    DOB: 02-12-1946, 66 y.o.   MRN: [id number]  Cough This is a recurrent problem. The current episode started more than 1 month ago. The problem has been unchanged. The problem occurs every few hours. The cough is productive of purulent sputum. Associated symptoms include chills, postnasal drip, rhinorrhea and wheezing. Pertinent negatives include no chest pain, ear congestion, ear pain, fever, headaches, heartburn, hemoptysis, myalgias, nasal congestion, rash, sore throat, shortness of breath, sweats or weight loss. The symptoms are aggravated by cold air. He has tried steroid inhaler, prescription cough suppressant, a beta-agonist inhaler and ipratropium inhaler for the symptoms. The treatment provided moderate relief. His past medical history is significant for COPD.      Review of Systems  Constitutional: Positive for chills. Negative for fever, weight loss, diaphoresis, activity change, appetite change, fatigue and unexpected weight change.  HENT: Positive for congestion, rhinorrhea and postnasal drip. Negative for hearing loss, ear pain, nosebleeds, sore throat, facial swelling, sneezing, drooling, mouth sores, trouble swallowing, neck pain, neck stiffness, dental problem, voice change, sinus pressure, tinnitus and ear discharge.   Eyes: Negative.   Respiratory: Positive for cough and wheezing. Negative for hemoptysis, choking, chest tightness, shortness of breath and stridor.   Cardiovascular: Negative for chest pain, palpitations and leg swelling.  Gastrointestinal: Negative for heartburn, nausea, vomiting, abdominal pain, diarrhea, constipation, blood in stool and anal bleeding.  Genitourinary: Negative.   Musculoskeletal: Negative for myalgias, back pain, joint swelling, arthralgias and gait problem.  Skin: Negative for color change, pallor and rash.  Neurological: Negative.  Negative for headaches.  Hematological: Negative for  adenopathy. Does not bruise/bleed easily.  Psychiatric/Behavioral: Negative.        Objective:   Physical Exam  Vitals reviewed. Constitutional: He is oriented to person, place, and time. Vital signs are normal. He appears well-developed and well-nourished.  Non-toxic appearance. He does not have a sickly appearance. He does not appear ill. No distress.  HENT:  Head: Normocephalic and atraumatic.  Mouth/Throat: No oropharyngeal exudate.  Eyes: Conjunctivae are normal. Right eye exhibits no discharge. Left eye exhibits no discharge. No scleral icterus.  Neck: Normal range of motion. Neck supple. No JVD present. No tracheal deviation present. No thyromegaly present.  Cardiovascular: Normal rate, regular rhythm, normal heart sounds and intact distal pulses.  Exam reveals no gallop and no friction rub.   No murmur heard. Pulmonary/Chest: Effort normal. No accessory muscle usage or stridor. Not tachypneic. No respiratory distress. He has no decreased breath sounds. He has wheezes in the right middle field and the left middle field. He has rhonchi in the right middle field and the left middle field. He has no rales. He exhibits no tenderness.  Abdominal: Soft. Bowel sounds are normal. He exhibits no distension and no mass. There is no tenderness. There is no rebound and no guarding.  Musculoskeletal: Normal range of motion. He exhibits no edema and no tenderness.  Lymphadenopathy:    He has no cervical adenopathy.  Neurological: He is oriented to person, place, and time.  Skin: Skin is warm and dry. No rash noted. He is not diaphoretic. No erythema. No pallor.  Psychiatric: He has a normal mood and affect. His behavior is normal. Judgment and thought content normal.          Assessment & Plan:

## 2011-07-23 NOTE — Assessment & Plan Note (Addendum)
He will continue his current inhalers and will try another round of avelox and start daliresp, also I gave him an injection of depo-medrol IM today to reduce his inflammation

## 2011-07-23 NOTE — Assessment & Plan Note (Signed)
I will check his CXR today to look for PNA, mass, edema

## 2011-07-23 NOTE — Patient Instructions (Signed)
Chronic Obstructive Pulmonary Disease Chronic obstructive pulmonary disease (COPD) is a condition in which airflow from the lungs is restricted. The lungs can never return to normal, but there are measures you can take which will improve them and make you feel better. CAUSES   Smoking.   Exposure to secondhand smoke.   Breathing in irritants (pollution, cigarette smoke, strong smells, aerosol sprays, paint fumes).   History of lung infections.  TREATMENT  Treatment focuses on making you comfortable (supportive care). Your caregiver may prescribe medications (inhaled or pills) to help improve your breathing. HOME CARE INSTRUCTIONS   If you smoke, stop smoking.   Avoid exposure to smoke, chemicals, and fumes that aggravate your breathing.   Take antibiotic medicines as directed by your caregiver.   Avoid medicines that dry up your system and slow down the elimination of secretions (antihistamines and cough syrups). This decreases respiratory capacity and may lead to infections.   Drink enough water and fluids to keep your urine clear or pale yellow. This loosens secretions.   Use humidifiers at home and at your bedside if they do not make breathing difficult.   Receive all protective vaccines your caregiver suggests, especially pneumococcal and influenza.   Use home oxygen as suggested.   Stay active. Exercise and physical activity will help maintain your ability to do things you want to do.   Eat a healthy diet.  SEEK MEDICAL CARE IF:   You develop pus-like mucus (sputum).   Breathing is more labored or exercise becomes difficult to do.   You are running out of the medicine you take for your breathing.  SEEK IMMEDIATE MEDICAL CARE IF:   You have a rapid heart rate.   You have agitation, confusion, tremors, or are in a stupor (family members may need to observe this).   It becomes difficult to breathe.   You develop chest pain.   You have a fever.  MAKE SURE YOU:    Understand these instructions.   Will watch your condition.   Will get help right away if you are not doing well or get worse.  Document Released: 01/02/2005 Document Revised: 03/14/2011 Document Reviewed: 05/25/2010 [facility] Patient Information 2012 [place].

## 2011-08-07 ENCOUNTER — Ambulatory Visit (INDEPENDENT_AMBULATORY_CARE_PROVIDER_SITE_OTHER): Payer: 59 | Admitting: Internal Medicine

## 2011-08-07 ENCOUNTER — Other Ambulatory Visit (INDEPENDENT_AMBULATORY_CARE_PROVIDER_SITE_OTHER): Payer: 59

## 2011-08-07 ENCOUNTER — Encounter: Payer: Self-pay | Admitting: Internal Medicine

## 2011-08-07 VITALS — BP 114/70 | HR 90 | Temp 97.6°F | Resp 16 | Wt 164.2 lb

## 2011-08-07 DIAGNOSIS — R7309 Other abnormal glucose: Secondary | ICD-10-CM

## 2011-08-07 DIAGNOSIS — E876 Hypokalemia: Secondary | ICD-10-CM

## 2011-08-07 DIAGNOSIS — J441 Chronic obstructive pulmonary disease with (acute) exacerbation: Secondary | ICD-10-CM

## 2011-08-07 DIAGNOSIS — I10 Essential (primary) hypertension: Secondary | ICD-10-CM

## 2011-08-07 LAB — CBC WITH DIFFERENTIAL/PLATELET
Basophils Absolute: 0 10*3/uL (ref 0.0–0.1)
Basophils Relative: 0.6 % (ref 0.0–3.0)
Eosinophils Absolute: 0 10*3/uL (ref 0.0–0.7)
Eosinophils Relative: 0.3 % (ref 0.0–5.0)
HCT: 42.6 % (ref 39.0–52.0)
Hemoglobin: 14.1 g/dL (ref 13.0–17.0)
Lymphocytes Relative: 17.5 % (ref 12.0–46.0)
Lymphs Abs: 1.1 10*3/uL (ref 0.7–4.0)
MCHC: 33 g/dL (ref 30.0–36.0)
MCV: 94.7 fl (ref 78.0–100.0)
Monocytes Absolute: 0.6 10*3/uL (ref 0.1–1.0)
Monocytes Relative: 9.5 % (ref 3.0–12.0)
Neutro Abs: 4.4 10*3/uL (ref 1.4–7.7)
Neutrophils Relative %: 72.1 % (ref 43.0–77.0)
Platelets: 241 10*3/uL (ref 150.0–400.0)
RBC: 4.5 Mil/uL (ref 4.22–5.81)
RDW: 14.1 % (ref 11.5–14.6)
WBC: 6.1 10*3/uL (ref 4.5–10.5)

## 2011-08-07 LAB — COMPREHENSIVE METABOLIC PANEL
ALT: 26 U/L (ref 0–53)
AST: 18 U/L (ref 0–37)
Albumin: 4.3 g/dL (ref 3.5–5.2)
Alkaline Phosphatase: 69 U/L (ref 39–117)
BUN: 15 mg/dL (ref 6–23)
CO2: 31 mEq/L (ref 19–32)
Calcium: 8.7 mg/dL (ref 8.4–10.5)
Chloride: 95 mEq/L — ABNORMAL LOW (ref 96–112)
Creatinine, Ser: 0.8 mg/dL (ref 0.4–1.5)
GFR: 109.65 mL/min (ref >60.00)
Glucose, Bld: 143 mg/dL — ABNORMAL HIGH (ref 70–99)
Potassium: 3.3 mEq/L — ABNORMAL LOW (ref 3.5–5.1)
Sodium: 137 mEq/L (ref 135–145)
Total Bilirubin: 0.7 mg/dL (ref 0.3–1.2)
Total Protein: 6.9 g/dL (ref 6.0–8.3)

## 2011-08-07 LAB — HEMOGLOBIN A1C: Hgb A1c MFr Bld: 5.5 % (ref 4.6–6.5)

## 2011-08-07 MED ORDER — ROFLUMILAST 500 MCG PO TABS: 500.0000 ug | ORAL_TABLET | Freq: Every day | ORAL | Status: DC

## 2011-08-07 MED ORDER — ACLIDINIUM BROMIDE 400 MCG/ACT IN AEPB: 1.0000 | INHALATION_SPRAY | Freq: Two times a day (BID) | RESPIRATORY_TRACT | Status: DC

## 2011-08-07 NOTE — Assessment & Plan Note (Signed)
He needs more treatment so I have started him on tudorza and daliresp, there is no evidence of infection today

## 2011-08-07 NOTE — Assessment & Plan Note (Signed)
I will check his a1c today to see if he has developed DM II

## 2011-08-07 NOTE — Patient Instructions (Signed)
Chronic Obstructive Pulmonary Disease Chronic obstructive pulmonary disease (COPD) is a condition in which airflow from the lungs is restricted. The lungs can never return to normal, but there are measures you can take which will improve them and make you feel better. CAUSES   Smoking.   Exposure to secondhand smoke.   Breathing in irritants (pollution, cigarette smoke, strong smells, aerosol sprays, paint fumes).   History of lung infections.  TREATMENT  Treatment focuses on making you comfortable (supportive care). Your caregiver may prescribe medications (inhaled or pills) to help improve your breathing. HOME CARE INSTRUCTIONS   If you smoke, stop smoking.   Avoid exposure to smoke, chemicals, and fumes that aggravate your breathing.   Take antibiotic medicines as directed by your caregiver.   Avoid medicines that dry up your system and slow down the elimination of secretions (antihistamines and cough syrups). This decreases respiratory capacity and may lead to infections.   Drink enough water and fluids to keep your urine clear or pale yellow. This loosens secretions.   Use humidifiers at home and at your bedside if they do not make breathing difficult.   Receive all protective vaccines your caregiver suggests, especially pneumococcal and influenza.   Use home oxygen as suggested.   Stay active. Exercise and physical activity will help maintain your ability to do things you want to do.   Eat a healthy diet.  SEEK MEDICAL CARE IF:   You develop pus-like mucus (sputum).   Breathing is more labored or exercise becomes difficult to do.   You are running out of the medicine you take for your breathing.  SEEK IMMEDIATE MEDICAL CARE IF:   You have a rapid heart rate.   You have agitation, confusion, tremors, or are in a stupor (family members may need to observe this).   It becomes difficult to breathe.   You develop chest pain.   You have a fever.  MAKE SURE YOU:    Understand these instructions.   Will watch your condition.   Will get help right away if you are not doing well or get worse.  Document Released: 01/02/2005 Document Revised: 03/14/2011 Document Reviewed: 05/25/2010 [facility] Patient Information 2012 [place].

## 2011-08-07 NOTE — Assessment & Plan Note (Signed)
His BP is well controlled, I will check his lytes and renal function 

## 2011-08-07 NOTE — Progress Notes (Signed)
Subjective:    Patient ID: [name], male    DOB: Sep 29, 1945, 66 y.o.   MRN: [id number]  Cough This is a recurrent problem. Episode onset: for 3 months. The problem has been unchanged. The problem occurs constantly. The cough is productive of sputum (clear sputum). Associated symptoms include nasal congestion, rhinorrhea and shortness of breath. Pertinent negatives include no chest pain, chills, ear congestion, ear pain, fever, headaches, heartburn, hemoptysis, myalgias, postnasal drip, rash, sore throat, sweats, weight loss or wheezing. The symptoms are aggravated by cold air. Risk factors for lung disease include smoking/tobacco exposure. He has tried prescription cough suppressant, steroid inhaler, oral steroids and a beta-agonist inhaler for the symptoms. The treatment provided mild relief. His past medical history is significant for COPD and emphysema.      Review of Systems  Constitutional: Negative for fever, chills, weight loss, diaphoresis, activity change, appetite change, fatigue and unexpected weight change.  HENT: Positive for rhinorrhea and sneezing. Negative for ear pain, sore throat, facial swelling, drooling, mouth sores, neck pain, neck stiffness, dental problem, voice change, postnasal drip and sinus pressure.   Eyes: Negative.   Respiratory: Positive for cough and shortness of breath. Negative for hemoptysis, wheezing and stridor.   Cardiovascular: Negative for chest pain, palpitations and leg swelling.  Gastrointestinal: Negative for heartburn, nausea, vomiting, abdominal pain, diarrhea, constipation, blood in stool and abdominal distention.  Genitourinary: Negative.   Musculoskeletal: Negative for myalgias, back pain, joint swelling, arthralgias and gait problem.  Skin: Negative for color change, pallor, rash and wound.  Neurological: Negative.  Negative for headaches.  Hematological: Negative for adenopathy. Does not bruise/bleed easily.  Psychiatric/Behavioral:  Negative.        Objective:   Physical Exam  Vitals reviewed. Constitutional: He is oriented to person, place, and time. He appears well-developed and well-nourished. No distress.  HENT:  Head: Normocephalic and atraumatic.  Mouth/Throat: Oropharynx is clear and moist. No oropharyngeal exudate.  Eyes: Conjunctivae are normal. Right eye exhibits no discharge. Left eye exhibits no discharge. No scleral icterus.  Neck: Normal range of motion. Neck supple. No JVD present. No tracheal deviation present. No thyromegaly present.  Cardiovascular: Normal rate, regular rhythm, normal heart sounds and intact distal pulses.  Exam reveals no gallop and no friction rub.   No murmur heard. Pulmonary/Chest: No accessory muscle usage or stridor. Not tachypneic. No respiratory distress. He has no decreased breath sounds. He has no wheezes. He has rhonchi in the right upper field, the right middle field, the right lower field, the left upper field, the left middle field and the left lower field. He has no rales. He exhibits no tenderness.  Abdominal: Soft. Bowel sounds are normal. He exhibits no distension and no mass. There is no tenderness. There is no rebound and no guarding.  Musculoskeletal: Normal range of motion. He exhibits no edema and no tenderness.  Lymphadenopathy:    He has no cervical adenopathy.  Neurological: He is oriented to person, place, and time.  Skin: Skin is warm and dry. No rash noted. He is not diaphoretic. No erythema. No pallor.  Psychiatric: He has a normal mood and affect. His behavior is normal. Judgment and thought content normal.      Lab Results  Component Value Date   WBC 12.6* 04/10/2011   HGB 13.8 04/10/2011   HCT 40.0 04/10/2011   PLT 206 04/10/2011   GLUCOSE 121* 04/10/2011   CHOL 100 10/15/2010   TRIG 75.0 10/15/2010   HDL 36.70* 10/15/2010  LDLCALC 48 10/15/2010   ALT 57* 01/06/2011   AST 44* 01/06/2011   NA 127* 04/10/2011   K 3.4* 04/10/2011   CL 88* 04/10/2011   CREATININE  0.65 04/10/2011   BUN 10 04/10/2011   CO2 30 04/10/2011   TSH 0.46 10/15/2010   PSA 1.95 10/15/2010   INR 1.22 01/07/2011   HGBA1C  Value: 6.1 (NOTE)                                                                       According to the ADA Clinical Practice Recommendations for 2011, when HbA1c is used as a screening test:   >=6.5%   Diagnostic of Diabetes Mellitus           (if abnormal result  is confirmed)  5.7-6.4%   Increased risk of developing Diabetes Mellitus  References:Diagnosis and Classification of Diabetes Mellitus,Diabetes ELTR,3202,33(IDHWY 1):S62-S69 and Standards of Medical Care in         Diabetes - 2011,Diabetes Care,2011,34  (Suppl 1):S11-S61.* 08/08/2010     Dg Chest 2 View  07/23/2011  *RADIOLOGY REPORT*  Clinical Data: Cough.  S O B and wheezing  CHEST - 2 VIEW  Comparison: 12/11/2010  Findings: The heart size appears normal.  There is chronic asymmetric elevation of the right hemidiaphragm.  Stable scarring identified within both lung bases, right greater than left.  No superimposed airspace consolidation.  No pleural effusion or edema.  IMPRESSION:  1.  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  Original Report Authenticated By: Angelita Ingles, M.D.  Assessment & Plan:

## 2011-08-08 ENCOUNTER — Telehealth: Payer: Self-pay | Admitting: Internal Medicine

## 2011-08-08 ENCOUNTER — Ambulatory Visit (INDEPENDENT_AMBULATORY_CARE_PROVIDER_SITE_OTHER): Payer: 59 | Admitting: Internal Medicine

## 2011-08-08 ENCOUNTER — Encounter: Payer: Self-pay | Admitting: Internal Medicine

## 2011-08-08 VITALS — BP 120/70 | HR 110 | Temp 98.3°F | Resp 16 | Wt 164.0 lb

## 2011-08-08 DIAGNOSIS — F4489 Other dissociative and conversion disorders: Secondary | ICD-10-CM

## 2011-08-08 DIAGNOSIS — R319 Hematuria, unspecified: Secondary | ICD-10-CM

## 2011-08-08 DIAGNOSIS — F29 Unspecified psychosis not due to a substance or known physiological condition: Secondary | ICD-10-CM

## 2011-08-08 MED ORDER — POTASSIUM CHLORIDE CRYS ER 20 MEQ PO TBCR: 20.0000 meq | EXTENDED_RELEASE_TABLET | Freq: Two times a day (BID) | ORAL | Status: DC

## 2011-08-08 NOTE — Progress Notes (Signed)
Addended by: Janith Lima on: 08/08/2011 07:56 AM   Modules accepted: Orders

## 2011-08-08 NOTE — Telephone Encounter (Signed)
The patient called and is concerned because he has had two issues he would like to get resolved.  He was seen by Dr.Jones for a cough yesterday, but decided to not bring these other issues up during the visit.    He states during the week he has had several episodes of disorientation.  He describes them as episodes of feeling "drunk".  He states during these times he is unable to make complete sentences and make sense of what is going on around him.  He has had two episodes this week.  He states each episode lasts for a couple hours.  On Tuesday he states he left his office after an episode and slept for 20 hours with only a break to eat a can of soup.  He has not changed any of his meds, and he states he is not drinking.    The second concern is he has found blood in his urine x3 yesterday.  He states this morning, there was none.

## 2011-08-08 NOTE — Telephone Encounter (Signed)
In regard to mental status changes - will need OV  In regard to blood in the urine - if he has not had a urologic evaluation in the last 5 years he will need to see a urologist for evaluation of painless hematuria.

## 2011-08-09 ENCOUNTER — Encounter: Payer: 59 | Admitting: *Deleted

## 2011-08-09 ENCOUNTER — Telehealth: Payer: Self-pay | Admitting: *Deleted

## 2011-08-09 DIAGNOSIS — R319 Hematuria, unspecified: Secondary | ICD-10-CM | POA: Insufficient documentation

## 2011-08-09 NOTE — Progress Notes (Signed)
Subjective:    Patient ID: [name], male    DOB: [birth year], 66 y.o.   MRN: [id number]  HPI [name], well known to the practice, is seen acutely for two problems.  He reports that last Tuesday he experienced an episode of confusion, abnormal behavior during which time he was speaking non-sensibly. His coworkers informed him after the fact of his event. His memory of the event is poor. He did make it home and reports that he slept for 20 hrs. He had a brief second episode of minor confusion today. He has had no focal weakness, no facial droop, no change in vision, no tremor, no slurred speech, no ataxia. He feels weak but does not have any focal symptoms.  Second problem: he has had 3 episodes of painless hematuria over the past two days. He denies any fever, flank pain, peroneal pain, abdominal pain. He has not had any sweats, weight loss. He has had no pain with BM, no pain with ejaculation.  Past Medical History  Diagnosis Date  . Allergy     rhinitis  . Hx of colonic polyps   . Diverticulitis   . Hypertension   . Osteoarthritis   . 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 CAD (coronary artery disease)     minimal coronary plaque in the LAD and right coronary system. 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 7-06. Last nuclear stress 11-17-06 fixed anterior/ inferior defect, no inducible ischemia, EF 81%  . Allergic rhinitis   . Hx of colonoscopy    Past Surgical History  Procedure Date  . Appendectomy   . Inguinal hernia repair 2008    Right, left remotely  . Total knee arthroplasty     right knee partial 2002, total left 2000  . Tonsillectomy   . Shoulder preplacement 2008    left, partial  . Ptca w/ coated stent lad     Cx secondary to tear  . Foot surgery 11-08    for removal of bone spurs- right foot  . Reconstruction of ankle 6-09    Right- [facility]    . L4-5 diskectomy w/ fusion, cage placement and rods 12-10    [facility]   Family History  Problem Relation Age of Onset  . Cancer Mother     uterine/ survivor  . Macular degeneration Mother   . Other Mother     ankle edema  . Coronary artery disease Father   . Hypertension Father   . Cancer Father     prostate  . Thyroid disease Sister   . Coronary artery disease Brother   . Heart attack Brother   . Hyperlipidemia Brother   . Other Brother     Schizophrenic  . Diabetes Neg Hx   . Coronary artery disease Other   . Heart attack Other    History   Social History  . Marital Status: Married    Spouse Name: N/A    Number of Children: 2  . Years of Education: N/A   Occupational History  . [employer]    Social History Main Topics  . Smoking status: Never Smoker   . Smokeless tobacco: Never Used  . Alcohol Use: 1.5 - 2.0 oz/week    3-4 drink(s) per week  . Drug Use: No  . Sexually Active: Not Currently   Other Topics Concern  .  Not on file   Social History Narrative  . No narrative on file       Review of Systems  System review is negative for any constitutional, cardiac, pulmonary, GI or neuro symptoms or complaints other than as described in the HPI.     Objective:   Physical Exam Filed Vitals:   08/08/11 1644  BP: 120/70  Pulse: 110  Temp: 98.3 F (36.8 C)  Resp: 16   Gen'l - WNWD white man in no distress who is cooperative through-out the exam. HEENT- /AT, C&S clear Cor- 2+ radial pulse, RRR, no JVD, no carotid bruit. Pulm - normal respirations Neuro: A&O x 3CN - normal facial symmetry, no devition uvula, no fasiculations of the tongue, PERRLA, EOMI, no visual field cuts, normal shoulder shrug; MS - normal grip strength, normal UE and LE strength; no tremor, no pronator drift, negative Rhomberg, steady gait, steady tandem gait, able to step up to exam; Nl DTRs throughout, normal finger-nose, heel-shin, rapid alternating finger movement. MS -  oriented to day, date, year; knows current events, serial 7's slow but right.        Assessment & Plan:  Confusional state - history does not suggest seizure, very atypical for CVA, normal neuro exam. Reviewed all meds - he has recently been taking benzonatate for cough. It is very likely that his confusional state was a side effect of the benzonatate.  Plan - stop tessalon perles  No indication for imaging  He is carefully instructed that for any recurrent symptoms or new neurologic symptoms he should seek immediate attention             If he has any minor symptoms, especially nonsensical speech or fugue state will obtain EEG and neuro consult

## 2011-08-09 NOTE — Telephone Encounter (Signed)
Hi [name],   I placed in your inbox the records release signed by [name] for Dr. Oletta Lamas GI notes. He has a significant history with Dr. Oletta Lamas but is wanting to change GI providers as all of his other care is with [facility] and he wants his GI care to be as well. He was also initially scheduled for moderate sedation, however, in reviewing his medications I wonder if Propofol would be a better choice for him. I have cancelled both his previsit and colonoscopy appointments. He is aware that we will be obtaining and reviewing records. Please call him to advise of appointments once records are reviewed.

## 2011-08-09 NOTE — Assessment & Plan Note (Signed)
Discussed the importance of this sentinel sign. It is likely to be painless prostatitis but cannot rule out intravesicle abnormality.  Plan - he has an appointment with GU.

## 2011-08-09 NOTE — Progress Notes (Deleted)
Pt did not show for previsit appointment. Phone call to patient, he states he did not know about the appointment as he has never received any paperwork from [place]. Previous GI history with Dr. Oletta Lamas, discussed with patient. He states he is changing providers as he wants all of his medical care to be provided by [facility]. He is to come by office today to sign a release to obtain medical records. Also discussed need for Propofol sedation with medications he is currently on. Informed him that I would cancel previsit and colonoscopy appointments in which he is in agreement. When he arrives to sign medical release, I will discuss with him that we will obtain medical records from Dr. Oletta Lamas office, Dr. Ardis Hughs can review and based on his recommendations we will schedule office visit if needed, previsit and procedures.

## 2011-08-12 ENCOUNTER — Ambulatory Visit: Payer: 59 | Admitting: Internal Medicine

## 2011-08-12 ENCOUNTER — Telehealth: Payer: Self-pay

## 2011-08-12 MED ORDER — DOXYCYCLINE HYCLATE 100 MG PO TABS: 100.0000 mg | ORAL_TABLET | Freq: Two times a day (BID) | ORAL | Status: AC

## 2011-08-12 NOTE — Telephone Encounter (Signed)
1. For bronchitis - ok for doxycycline 100 mg bid x 7 days  2. Question - any more confusional state episodes?  3. Question - any more episodes of painless hematuria

## 2011-08-12 NOTE — Telephone Encounter (Signed)
Pt called c/o fatigue productive cough (green-yellow), difficulty sleeping and decrease appetite. Pt is requesting advisement.

## 2011-08-12 NOTE — Telephone Encounter (Signed)
Pt informed of ABX and says that painless hematuria has resolved and he has had no further episodes of confusion.

## 2011-08-15 ENCOUNTER — Telehealth: Payer: Self-pay

## 2011-08-15 NOTE — Telephone Encounter (Signed)
Pt called stating that he is coughing up a considerable amount of mucus and is concerned that current ABX course is not sufficient. Pt is going out of the country next week and is requesting MD advisement on whether taking an additional ABX as a precaution is necessary. Please advise.

## 2011-08-15 NOTE — Telephone Encounter (Signed)
Doxycycline is an excellent antibiotic for respiratory infections. 7 days is usually enough but additional doses can be called in for a full 10 day course (6 more doses).

## 2011-08-16 ENCOUNTER — Telehealth: Payer: Self-pay | Admitting: Gastroenterology

## 2011-08-16 NOTE — Telephone Encounter (Signed)
The pt wants to call back to schedule his new pt appt after he returns from vacation

## 2011-08-16 NOTE — Telephone Encounter (Signed)
Reviewed severed colonoscopy/EGD reports from Dr. Oletta Lamas.  He had TA in 2004, no polyps 2007.  He has history of IDA AND hemochromatosis, also maybe bleeding meckle's.  Pretty complex GI history, he should have appt as ngi before planning any endoscopies.

## 2011-08-16 NOTE — Telephone Encounter (Signed)
Pt advised but declined additional dosages. Pt says he feels much better but will call back if needed.

## 2011-08-18 ENCOUNTER — Other Ambulatory Visit: Payer: Self-pay | Admitting: Internal Medicine

## 2011-08-19 ENCOUNTER — Other Ambulatory Visit: Payer: 59 | Admitting: Gastroenterology

## 2011-08-20 ENCOUNTER — Telehealth: Payer: Self-pay

## 2011-08-20 ENCOUNTER — Other Ambulatory Visit: Payer: Self-pay

## 2011-08-20 MED ORDER — ALPRAZOLAM ER 1 MG PO TB24: 1.0000 mg | ORAL_TABLET | ORAL | Status: DC

## 2011-08-20 NOTE — Telephone Encounter (Signed)
Received refill request from  [facility] request refills for alprazolam er 1 mg. Please advise if ok to refill Thanks

## 2011-08-22 ENCOUNTER — Other Ambulatory Visit: Payer: Self-pay | Admitting: Internal Medicine

## 2011-09-03 ENCOUNTER — Telehealth: Payer: Self-pay | Admitting: Internal Medicine

## 2011-09-03 ENCOUNTER — Encounter: Payer: Self-pay | Admitting: Gastroenterology

## 2011-09-03 NOTE — Telephone Encounter (Signed)
Caller: Ondra/Patient; PCP: Adella Hare; CB#: [phone]; Call regarding Cough/Congestion;  [name] developed a cough in March 2013.  Has been on 3 courses of abx's.  Completed last abx on 08/18/11.  Cough is worsening.  Cough is productive, green/yellow and thick.  Afebrile.  Utilized Cough Guideline.  See PCP within 4 hrs disposition.  No appts available in EPIC.  Advised UC.  Pt refuses and requests appt for tomorrow.  Scheduled appt for 09/04/11 @ 1130 with Dr. Linda Hedges.

## 2011-09-04 ENCOUNTER — Encounter: Payer: Self-pay | Admitting: Internal Medicine

## 2011-09-04 ENCOUNTER — Ambulatory Visit (INDEPENDENT_AMBULATORY_CARE_PROVIDER_SITE_OTHER)
Admission: RE | Admit: 2011-09-04 | Discharge: 2011-09-04 | Disposition: A | Discharge status: Home / Self Care | Payer: 59 | Source: Ambulatory Visit | Attending: Internal Medicine | Admitting: Internal Medicine

## 2011-09-04 ENCOUNTER — Other Ambulatory Visit (INDEPENDENT_AMBULATORY_CARE_PROVIDER_SITE_OTHER): Payer: 59

## 2011-09-04 ENCOUNTER — Ambulatory Visit (INDEPENDENT_AMBULATORY_CARE_PROVIDER_SITE_OTHER): Payer: 59 | Admitting: Internal Medicine

## 2011-09-04 VITALS — BP 96/60 | HR 82 | Temp 98.1°F | Resp 16 | Wt 160.0 lb

## 2011-09-04 DIAGNOSIS — R059 Cough, unspecified: Secondary | ICD-10-CM

## 2011-09-04 DIAGNOSIS — R05 Cough: Secondary | ICD-10-CM

## 2011-09-04 LAB — CBC WITH DIFFERENTIAL/PLATELET
Basophils Absolute: 0 10*3/uL (ref 0.0–0.1)
Basophils Relative: 0.5 % (ref 0.0–3.0)
Eosinophils Absolute: 0 10*3/uL (ref 0.0–0.7)
Eosinophils Relative: 0.5 % (ref 0.0–5.0)
HCT: 38.7 % — ABNORMAL LOW (ref 39.0–52.0)
Hemoglobin: 12.8 g/dL — ABNORMAL LOW (ref 13.0–17.0)
Lymphocytes Relative: 19.5 % (ref 12.0–46.0)
Lymphs Abs: 1.4 10*3/uL (ref 0.7–4.0)
MCHC: 33 g/dL (ref 30.0–36.0)
MCV: 94.4 fl (ref 78.0–100.0)
Monocytes Absolute: 0.7 10*3/uL (ref 0.1–1.0)
Monocytes Relative: 9.3 % (ref 3.0–12.0)
Neutro Abs: 5 10*3/uL (ref 1.4–7.7)
Neutrophils Relative %: 70.2 % (ref 43.0–77.0)
Platelets: 350 10*3/uL (ref 150.0–400.0)
RBC: 4.09 Mil/uL — ABNORMAL LOW (ref 4.22–5.81)
RDW: 14.4 % (ref 11.5–14.6)
WBC: 7.1 10*3/uL (ref 4.5–10.5)

## 2011-09-04 MED ORDER — ADALIMUMAB 40 MG/0.8ML ~~LOC~~ KIT: 40.0000 mg | PACK | Freq: Once | SUBCUTANEOUS | Status: DC

## 2011-09-04 NOTE — Progress Notes (Signed)
  Subjective:    Patient ID: [name], male    DOB: 03-31-1946, 66 y.o.   MRN: [id number]  HPI [name] presents for persistent respiratory symptoms with cough productive of inspissated secretions of varied color. He was treated for URI/bronchitis in early May by Dr. Ronnald Ramp. He had recurrent symptoms and had two additional rounds of antibiotics, last being doxycycline, last filled May 6th. He now has a recurrent cough. He has started Humira and it seems his cough/symptoms are related. He has not had any fever, chills, SOB, rash but has had increased fatigue and malaise.  PMH, FamHx and SocHx reviewed for any changes and relevance.    Review of Systems System review is negative for any constitutional, cardiac, pulmonary, GI or neuro symptoms or complaints other than as described in the HPI.     Objective:   Physical Exam Filed Vitals:   09/04/11 1215  BP: 96/60  Pulse: 82  Temp: 98.1 F (36.7 C)  Resp: 16   BP Readings from Last 3 Encounters:  09/04/11 96/60  08/08/11 120/70  08/07/11 114/70   WNWD white man, tanned in no distress HEENT - C&S clear Cor - RRR Pulm - good breath sounds w/o rales or wheezes       Assessment & Plan:  Cough - patient reports he was screened for TB prior to starting humira. There is a concern for immunocompromise on this product. Also, want to avoid over use of antibiotics  Plan -  Lab - CBC  CXR  Antitussive products of choice  Further recommendations to be based on lab and x-ray.   Addendum: CBC normal          CXR negative for infection  Patient called - no indication for antibiotics.               Take mucinex 1200 mg bid                          For continued symptoms check with rheumatologist for possible drug related side effects.

## 2011-09-05 ENCOUNTER — Telehealth: Payer: Self-pay | Admitting: *Deleted

## 2011-09-05 NOTE — Telephone Encounter (Signed)
LEFT MESSAGE ON MOBILE PHONE TO CALL BACK CONCERNING MEDICATIONS FLUOXITINE OR PAXIL.

## 2011-09-06 ENCOUNTER — Telehealth: Payer: Self-pay | Admitting: *Deleted

## 2011-09-06 NOTE — Telephone Encounter (Signed)
Left message on vm on mobile phone to return call concerning medication list

## 2011-09-09 ENCOUNTER — Other Ambulatory Visit: Payer: Self-pay | Admitting: Internal Medicine

## 2011-09-10 ENCOUNTER — Telehealth: Payer: Self-pay | Admitting: *Deleted

## 2011-09-10 NOTE — Telephone Encounter (Signed)
Spoke with patient concerning medication he takes. He is not sure. He states he takes the blue and white one but will look when he gets home this evening and call tomorrow to give name of medication.

## 2011-09-10 NOTE — Telephone Encounter (Signed)
Message copied by Ellene Route on Tue Sep 10, 2011  9:19 AM ------      Message from: Adella Hare E      Created: Thu Sep 05, 2011  5:42 AM       Please check with patient about med list: taking both fluoxetine and paxil? Any others errors on the med list?

## 2011-09-10 NOTE — Telephone Encounter (Signed)
Spoke with patient concerning medications he takes. He is not sure. States it is the blue and white one and will look this evening when returns home and call back tomorrow with name of medication

## 2011-09-11 ENCOUNTER — Telehealth: Payer: Self-pay | Admitting: *Deleted

## 2011-09-11 NOTE — Telephone Encounter (Signed)
Patient called and stated he only takes Prozac 37m one at [employer]

## 2011-09-11 NOTE — Telephone Encounter (Signed)
Message copied by Ellene Route on Wed Sep 11, 2011  2:54 PM ------      Message from: Adella Hare E      Created: Thu Sep 05, 2011  5:42 AM       Please check with patient about med list: taking both fluoxetine and paxil? Any others errors on the med list?

## 2011-09-19 ENCOUNTER — Other Ambulatory Visit: Payer: Self-pay | Admitting: Internal Medicine

## 2011-09-19 ENCOUNTER — Telehealth: Payer: Self-pay

## 2011-09-19 NOTE — Telephone Encounter (Signed)
Ok x 5

## 2011-09-19 NOTE — Telephone Encounter (Signed)
Please advise if ok to refill alprazolam 0.5 TID. Thanks

## 2011-09-20 MED ORDER — ALPRAZOLAM 0.5 MG PO TABS: 0.5000 mg | ORAL_TABLET | Freq: Three times a day (TID) | ORAL | Status: DC | PRN

## 2011-09-27 ENCOUNTER — Other Ambulatory Visit: Payer: Self-pay | Admitting: Orthopedic Surgery

## 2011-09-27 DIAGNOSIS — M25512 Pain in left shoulder: Secondary | ICD-10-CM

## 2011-10-01 ENCOUNTER — Ambulatory Visit
Admission: RE | Admit: 2011-10-01 | Discharge: 2011-10-01 | Disposition: A | Discharge status: Home / Self Care | Payer: 59 | Source: Ambulatory Visit | Attending: Orthopedic Surgery | Admitting: Orthopedic Surgery

## 2011-10-01 DIAGNOSIS — M25512 Pain in left shoulder: Secondary | ICD-10-CM

## 2011-10-01 MED ORDER — IOHEXOL 180 MG/ML  SOLN: 12.0000 mL | Freq: Once | INTRAMUSCULAR | Status: AC | PRN

## 2011-10-08 ENCOUNTER — Encounter: Payer: Self-pay | Admitting: Cardiovascular Disease

## 2011-10-08 ENCOUNTER — Ambulatory Visit (INDEPENDENT_AMBULATORY_CARE_PROVIDER_SITE_OTHER): Payer: 59 | Admitting: Cardiovascular Disease

## 2011-10-08 VITALS — BP 111/72 | HR 77 | Ht 67.0 in | Wt 161.0 lb

## 2011-10-08 DIAGNOSIS — I4891 Unspecified atrial fibrillation: Secondary | ICD-10-CM

## 2011-10-08 DIAGNOSIS — I10 Essential (primary) hypertension: Secondary | ICD-10-CM

## 2011-10-08 DIAGNOSIS — E785 Hyperlipidemia, unspecified: Secondary | ICD-10-CM

## 2011-10-08 DIAGNOSIS — I2510 Atherosclerotic heart disease of native coronary artery without angina pectoris: Secondary | ICD-10-CM
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
## 2011-10-08 NOTE — Assessment & Plan Note (Signed)
Well controlled.  Continue current medications and low sodium Dash type diet.    

## 2011-10-08 NOTE — Assessment & Plan Note (Signed)
S/P DCC maint NSR and off Pradaxa Should be on telemetry post op as he is at risk for PAF

## 2011-10-08 NOTE — Assessment & Plan Note (Signed)
Stable with no angina and good activity level.  Continue medical Rx NO angina  myovue 08/11/10 non ischemic with EF 64%  Clear to have shoulder surgery

## 2011-10-08 NOTE — Assessment & Plan Note (Signed)
Cholesterol is at goal.  Continue current dose of statin and diet Rx.  No myalgias or side effects.  F/U  LFT's in 6 months. Lab Results  Component Value Date   LDLCALC 48 10/15/2010

## 2011-10-08 NOTE — Patient Instructions (Signed)
Your physician wants you to follow-up in: Mena will receive a reminder letter in the mail two months in advance. If you don't receive a letter, please call our office to schedule the follow-up appointment. Your physician recommends that you continue on your current medications as directed. Please refer to the Current Medication list given to you today.  ORTHOPEDIC MD DR Vonna Kotyk LANDAU   [place] PHONE NUMBER  [phone]

## 2011-10-09 ENCOUNTER — Ambulatory Visit: Payer: 59 | Admitting: Cardiovascular Disease

## 2011-10-14 ENCOUNTER — Encounter: Payer: Self-pay | Admitting: Gastroenterology

## 2011-10-14 ENCOUNTER — Telehealth: Payer: Self-pay | Admitting: *Deleted

## 2011-10-14 NOTE — Telephone Encounter (Signed)
[name] could you verify if Mr. [place] needs Propofol for sedation.  He's got multiple medical problems and many meds.  Thanks.  Emerson Monte

## 2011-10-14 NOTE — Telephone Encounter (Signed)
Pt needs to have office visit no endos scheduled prior to appt Wille Glaser has been notified

## 2011-10-21 ENCOUNTER — Telehealth: Payer: Self-pay

## 2011-10-21 NOTE — Telephone Encounter (Signed)
Pt called requesting 3 mth refills of OxyContin 10 and Oxycodone 5/325.

## 2011-10-21 NOTE — Telephone Encounter (Signed)
OK 

## 2011-10-23 NOTE — Telephone Encounter (Signed)
Pt called again regarding status of Rxs, please advise pt directly.

## 2011-10-24 MED ORDER — OXYCODONE HCL 10 MG PO TB12: 10.0000 mg | ORAL_TABLET | Freq: Three times a day (TID) | ORAL | Status: DC | PRN

## 2011-10-24 MED ORDER — OXYCODONE-ACETAMINOPHEN 5-325 MG PO TABS: 1.0000 | ORAL_TABLET | Freq: Four times a day (QID) | ORAL | Status: DC | PRN

## 2011-10-24 NOTE — Telephone Encounter (Signed)
RX printed and given to pt

## 2011-10-29 ENCOUNTER — Other Ambulatory Visit: Payer: 59 | Admitting: Gastroenterology

## 2011-11-12 ENCOUNTER — Ambulatory Visit (INDEPENDENT_AMBULATORY_CARE_PROVIDER_SITE_OTHER): Payer: 59 | Admitting: Gastroenterology

## 2011-11-12 ENCOUNTER — Encounter: Payer: Self-pay | Admitting: Gastroenterology

## 2011-11-12 VITALS — BP 108/70 | HR 88 | Ht 64.5 in | Wt 158.2 lb

## 2011-11-12 DIAGNOSIS — R933 Abnormal findings on diagnostic imaging of other parts of digestive tract: Secondary | ICD-10-CM

## 2011-11-12 NOTE — Patient Instructions (Addendum)
We will get records from Dr. Oletta Lamas colonoscopies, any pathology reports. After that will decide on timing of next colonoscopy (will be with deep sedation, MAC).

## 2011-11-12 NOTE — Progress Notes (Signed)
HPI: This is a  very pleasant 66 year old man whom I am meeting for the first time today.   Has had colonoscopies in the past.  3 colonoscopy (one every 2 years with Dr. Leonie Douglas).  He was not happy with Programmer, systems.  He believes polyps were removed.  He has regular BM, usually every morning.  No overt bleeding,  No changes in his bowels.    No colon cancer in family, but dad had polyps.  Cbc Last month shows a very mild, normocytic anemia.  Acromium break left side, has rotator cuff problems  On chronic narcs for back pains, RA.  On humira for RA.  Every other week.   Review of systems: Pertinent positive and negative review of systems were noted in the above HPI section. Complete review of systems was performed and was otherwise normal.    Past Medical History  Diagnosis Date  . Hx of colonic polyps   . Diverticulosis   . Hypertension   . Osteoarthritis   . 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 CAD (coronary artery disease)     minimal coronary plaque in the LAD and right coronary system. 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 7-06. Last nuclear stress 11-17-06 fixed anterior/ inferior defect, no inducible ischemia, EF 81%  . Allergic rhinitis   . Hx of colonoscopy   . RA (rheumatoid arthritis)     Past Surgical History  Procedure Date  . Appendectomy   . Inguinal hernia repair 2008    Right, left remotely  . Total knee arthroplasty     right knee partial 2002  . Tonsillectomy   . Shoulder preplacement 2008    left, partial  . Ptca w/ coated stent lad     Cx secondary to tear  . Foot surgery 11-08    for removal of bone spurs- right foot  . Reconstruction of ankle 6-09    Right- [facility]  . L4-5 diskectomy w/ fusion, cage placement and rods 12-10    [place]  . Partial knee arthroplasty     left  . Total shoulder replacement     right    Current  Outpatient Prescriptions  Medication Sig Dispense Refill  . Aclidinium Bromide (TUDORZA PRESSAIR) 400 MCG/ACT AEPB Inhale 1 puff into the lungs 2 (two) times daily.  1 each  11  . adalimumab (HUMIRA PEN) 40 MG/0.8ML injection Inject 0.8 mLs (40 mg total) into the skin once.  2 each  0  . ALPRAZolam (XANAX) 0.5 MG tablet Take 1 tablet (0.5 mg total) by mouth 3 (three) times daily as needed for sleep or anxiety.  90 tablet  5  . aspirin (ASPIRIN EC) 81 MG EC tablet Take 81 mg by mouth daily.        . celecoxib (CELEBREX) 200 MG capsule Take 1 capsule (200 mg total) by mouth 2 (two) times daily.  60 capsule  2  . COLCRYS 0.6 MG tablet TAKE 1 TABLET TWICE A DAY  60 tablet  5  . DULERA 100-5 MCG/ACT AERO INHALE 2 PUFFS FIRST THING IN THE MORNING AND 2 PUFFS ABOUT 12 HOURS LATER  13 g  2  . FLUoxetine (PROZAC) 40 MG capsule TAKE ONE CAPSULE EVERY DAY  30 capsule  5  . fluticasone (FLONASE) 50 MCG/ACT nasal spray USE 2 SPRAYS BY NASAL ROUTE DAILY.  16 g  3  . furosemide (LASIX) 20 MG tablet TAKE 1 TABLET (20 MG TOTAL) BY MOUTH DAILY.  30 tablet  5  . gabapentin (NEURONTIN) 300 MG capsule 2 every am, 1 at noon and 2 at bedtime       . hydrochlorothiazide (HYDRODIURIL) 25 MG tablet TAKE 1 TABLET (25 MG TOTAL) BY MOUTH DAILY.  30 tablet  3  . Hydrocod Polst-Chlorphen Polst 10-8 MG CP12 Take 1 tablet by mouth 2 (two) times daily as needed.  25 each  1  . leflunomide (ARAVA) 10 MG tablet Take 1 tablet (10 mg total) by mouth daily.  30 tablet  5  . losartan (COZAAR) 50 MG tablet TAKE 1 TABLET BY MOUTH ONCE A DAY  90 tablet  2  . Melatonin ([employer]S MELATONIN) 5 MG TABS Take 5 mg by mouth daily.        [name] oxyCODONE (OXYCONTIN) 10 MG 12 hr tablet Take 1 tablet (10 mg total) by mouth 3 (three) times daily as needed for pain. FILL on or after 12/25/11  90 tablet  0  . oxyCODONE-acetaminophen (PERCOCET/ROXICET) 5-325 MG per tablet Take 1 tablet by mouth every 6 (six) hours as needed. FILL on for after 12/25/11.  90 tablet   0  . pantoprazole (PROTONIX) 40 MG tablet Take 1 tablet (40 mg total) by mouth 2 (two) times daily.  60 tablet  5  . potassium chloride SA (K-DUR,KLOR-CON) 20 MEQ tablet Take 1 tablet (20 mEq total) by mouth 2 (two) times daily.  60 tablet  3  . predniSONE (DELTASONE) 1 MG tablet Take 5 mg by mouth daily.       . roflumilast (DALIRESP) 500 MCG TABS tablet Take 1 tablet (500 mcg total) by mouth daily.  30 tablet  11  . simvastatin (ZOCOR) 40 MG tablet TAKE 1 TABLET EVERY 2 TO 3 DAYS  90 tablet  2  . traZODone (DESYREL) 50 MG tablet TAKE 1 TABLET (50 MG TOTAL) BY MOUTH AT BEDTIME.  30 tablet  3  . triamcinolone (KENALOG) 0.1 % cream APPLY TO AFFECTED AREA AS DIRECTED  300 g  1    Allergies as of 11/12/2011 - Review Complete 11/12/2011  Allergen Reaction Noted  . Penicillins Hives, Shortness Of Breath, and Rash   . Morphine and related Nausea And Vomiting and Swelling 04/10/2011    Family History  Problem Relation Age of Onset  . Uterine cancer Mother     survivor  . Macular degeneration Mother   . Other Mother     ankle edema  . Coronary artery disease Father   . Hypertension Father   . Prostate cancer Father   . Thyroid disease Sister   . Heart attack Brother   . Hyperlipidemia Brother   . Other Brother     Schizophrenic  . Diabetes Neg Hx   . Coronary artery disease Maternal Aunt   . Heart attack Maternal Aunt   . Lung cancer Mother   . Rheum arthritis Sister   . Hemochromatosis Sister     History   Social History  . Marital Status: Married    Spouse Name: N/A    Number of Children: 2  . Years of Education: N/A   Occupational History  . [employer]    Social History Main Topics  . Smoking status: Never Smoker   . Smokeless tobacco: Never Used  . Alcohol Use: No  . Drug Use: No  . Sexually Active: Not Currently   Other  Topics Concern  . Not on file   Social History Narrative  . No narrative on file       Physical Exam: There were no vitals taken  for this visit. Constitutional: generally well-appearing Psychiatric: alert and oriented x3 Eyes: extraocular movements intact Mouth: oral pharynx moist, no lesions Neck: supple no lymphadenopathy Cardiovascular: heart regular rate and rhythm Lungs: clear to auscultation bilaterally Abdomen: soft, nontender, nondistended, no obvious ascites, no peritoneal signs, normal bowel sounds Extremities: no lower extremity edema bilaterally Skin: no lesions on visible extremities    Assessment and plan: 66 y.o. male with  history of colon polyps  We will get records sent over from the previous gastroenterologist for review including Pap college he reports. I do suspect that he is correct and that he had indeed had precancerous polyps. He believes his last procedure was 6 years ago or so. After reviewing the records I will get back in touch with him about the timing of his next, surveillance examination.

## 2011-11-29 ENCOUNTER — Other Ambulatory Visit: Payer: Self-pay | Admitting: Orthopedic Surgery

## 2011-11-29 DIAGNOSIS — M25512 Pain in left shoulder: Secondary | ICD-10-CM

## 2011-12-02 ENCOUNTER — Ambulatory Visit
Admission: RE | Admit: 2011-12-02 | Discharge: 2011-12-02 | Disposition: A | Discharge status: Home / Self Care | Payer: 59 | Source: Ambulatory Visit | Attending: Orthopedic Surgery | Admitting: Orthopedic Surgery

## 2011-12-02 DIAGNOSIS — M25512 Pain in left shoulder: Secondary | ICD-10-CM

## 2011-12-04 ENCOUNTER — Other Ambulatory Visit: Payer: Self-pay | Admitting: Internal Medicine

## 2011-12-09 ENCOUNTER — Telehealth: Payer: Self-pay | Admitting: Gastroenterology

## 2011-12-09 NOTE — Telephone Encounter (Signed)
I received very incomplete records from [facility].    Colonoscopy [name] 10/1993 indication FH of colon polyps; findings cecal polyp, large internal hems, left sided tics (pahology report not sent). Colonoscopy [name] 09/1998 internal hems, left sided tics, no polyps.  Last page only of some type of endoscopic procedure by Dr. Oletta [name] 08/2005.  Please call [facility] and ask for all full endoscopy, colonoscopy reports with all associated pathology.  Please let [name] know we are still working on getting his complete records from [place].

## 2011-12-10 NOTE — Telephone Encounter (Signed)
Left message at pt's home phone  [facility] called and records requested, I spoke with Glenard Haring she will fax the records over now

## 2011-12-12 ENCOUNTER — Telehealth: Payer: Self-pay | Admitting: Gastroenterology

## 2011-12-12 NOTE — Telephone Encounter (Signed)
Forward 21 pages from [facility] to Dr. Owens Loffler for review on 12-12-11 ym

## 2011-12-15 ENCOUNTER — Telehealth: Payer: Self-pay | Admitting: Gastroenterology

## 2011-12-15 NOTE — Telephone Encounter (Signed)
Colonoscopy [name] 10/1993 indication FH of colon polyps; findings cecal polyp, large internal hems, left sided tics (pahology report states "no pathologic abnormality"). Colonoscopy [name] 09/1998 done for "strong family history of colon cancer"  internal hems, left sided tics, no polyps; was recommended to have repeat exam in 5 years due to "family history."  Colonoscopy [name] 05/2002 for new IDA, father with colon polyps and mother with uterine cancer; two polyps removed, path showed "fragments of adenomas".   Colonoscopy Dr. Oletta [name] 08/2005 done for personal history of adenomatous poylps; diverticulosis only, no polyps, was recommended to have repeat colonoscopy in 5 years and FOB testing in 3 years.  [name], Received more complete records from [place], summarized above.  He has history of adenomatous polyps and last colonoscopy was 08/2005, should be scheduled for [facility] colonsocoyp, moderate sedation  thanks

## 2011-12-16 ENCOUNTER — Encounter: Payer: Self-pay | Admitting: Gastroenterology

## 2011-12-16 NOTE — Telephone Encounter (Signed)
Pt has been notified and transferred to [facility] to schedule colon

## 2011-12-16 NOTE — Telephone Encounter (Signed)
Left message on machine to call back  

## 2012-01-03 ENCOUNTER — Other Ambulatory Visit: Payer: Self-pay | Admitting: Internal Medicine

## 2012-01-03 ENCOUNTER — Other Ambulatory Visit: Payer: Self-pay | Admitting: Orthopedic Surgery

## 2012-01-06 ENCOUNTER — Telehealth: Payer: Self-pay | Admitting: Gastroenterology

## 2012-01-06 NOTE — Telephone Encounter (Signed)
Pt has colon scheduled for 01/31/12 and then shoulder surgery on 10/31.  Pt wants to make sure it is ok for him to have these so close together?

## 2012-01-06 NOTE — Telephone Encounter (Signed)
Pt aware.

## 2012-01-06 NOTE — Telephone Encounter (Signed)
Yes, it should be fine

## 2012-01-07 ENCOUNTER — Other Ambulatory Visit (HOSPITAL_COMMUNITY): Payer: 59

## 2012-01-08 ENCOUNTER — Other Ambulatory Visit: Payer: Self-pay | Admitting: Internal Medicine

## 2012-01-14 ENCOUNTER — Ambulatory Visit (HOSPITAL_COMMUNITY): Admission: RE | Admit: 2012-01-14 | Payer: 59 | Source: Ambulatory Visit | Admitting: Orthopedic Surgery

## 2012-01-14 SURGERY — OPEN REDUCTION INTERNAL FIXATION (ORIF) SHOULDER FRACTURE
Anesthesia: Choice | Laterality: Left

## 2012-01-20 ENCOUNTER — Encounter: Payer: Self-pay | Admitting: Gastroenterology

## 2012-01-20 ENCOUNTER — Ambulatory Visit (AMBULATORY_SURGERY_CENTER): Payer: 59 | Admitting: *Deleted

## 2012-01-20 VITALS — Ht 67.0 in | Wt 158.8 lb

## 2012-01-20 DIAGNOSIS — Z1211 Encounter for screening for malignant neoplasm of colon: Secondary | ICD-10-CM

## 2012-01-20 MED ORDER — MOVIPREP 100 G PO SOLR: ORAL | Status: DC

## 2012-01-21 ENCOUNTER — Other Ambulatory Visit: Payer: Self-pay | Admitting: Internal Medicine

## 2012-01-22 ENCOUNTER — Telehealth: Payer: Self-pay | Admitting: Internal Medicine

## 2012-01-22 DIAGNOSIS — J441 Chronic obstructive pulmonary disease with (acute) exacerbation: Secondary | ICD-10-CM

## 2012-01-22 NOTE — Telephone Encounter (Signed)
Caller: Jedaiah/Patient; Patient Name: [name]; PCP: Adella Hare (Adults only); [facility] Call[facility] Phone Number: [phone].  Pt calling today 01/22/12 regarding wants to come by office to pick up his refill scripts for Oxycontin and Oxycodone, says MD ususally give him 3 refills for each.  Would like to come by office either this afternoon or tomorrow to pick these up.  PLEASE CALL PT BACK AT [phone] TO LET HIM KNOW IF HE CAN COME BY TO PICK THESE UP.

## 2012-01-23 ENCOUNTER — Encounter (HOSPITAL_COMMUNITY): Payer: Self-pay | Admitting: Pharmacy Technician

## 2012-01-23 ENCOUNTER — Encounter (HOSPITAL_COMMUNITY): Payer: Self-pay

## 2012-01-23 MED ORDER — OXYCODONE HCL 10 MG PO TB12: 10.0000 mg | ORAL_TABLET | Freq: Three times a day (TID) | ORAL | Status: DC | PRN

## 2012-01-23 MED ORDER — ACLIDINIUM BROMIDE 400 MCG/ACT IN AEPB: 1.0000 | INHALATION_SPRAY | Freq: Two times a day (BID) | RESPIRATORY_TRACT | Status: DC

## 2012-01-23 MED ORDER — OXYCODONE-ACETAMINOPHEN 5-325 MG PO TABS: 1.0000 | ORAL_TABLET | Freq: Four times a day (QID) | ORAL | Status: DC | PRN

## 2012-01-23 NOTE — Patient Instructions (Addendum)
[name]  01/23/2012   Your procedure is scheduled on:  10-31 -2013  Report to [facility] at     [place]   AM .  Call this number if you have problems the morning of surgery: [phone]  Or Presurgical Testing [phone](Shauntell Iglesia)   Remember:   Do not eat food:After Midnight.    Take these medicines the morning of surgery with A SIP OF WATER:use and bring- PressAir. Flonase nasal spray, eye drops. Take these Colchicine, Oxycodone. PantopraZOLE. CELEBREX. GABAPENTIN. SIMVASTATIN. Prednisone.   Do not wear jewelry, make-up or nail polish.  Do not wear lotions, powders, or perfumes. You may wear deodorant.  Do not shave 48 hours prior to surgery.(face and neck okay, no shaving of legs)  Do not bring valuables to the hospital.  Contacts, dentures or bridgework may not be worn into surgery.  Leave suitcase in the car. After surgery it may be brought to your room.  For patients admitted to the hospital, checkout time is 11:00 AM the day of discharge.   Patients discharged the day of surgery will not be allowed to drive home. Must have responsible person with you x 24 hours once discharged.  Name and phone number of your driver: Zella Richer [phone]/ [phone]  Special Instructions: CHG Shower Use Special Wash: see special instruction sheet.(avoid face and genitals)   Please read over the following fact sheets that you were given: MRSA Information,  Incentive Spirometry Instruction.

## 2012-01-23 NOTE — Telephone Encounter (Signed)
Rx printed for oxycodone and OxyContin for patient to pick up. Patient notified of this.

## 2012-01-23 NOTE — Telephone Encounter (Signed)
[place] for Rx s

## 2012-01-24 ENCOUNTER — Encounter (HOSPITAL_COMMUNITY): Payer: Self-pay

## 2012-01-24 ENCOUNTER — Encounter (HOSPITAL_COMMUNITY)
Admission: RE | Admit: 2012-01-24 | Discharge: 2012-01-24 | Disposition: A | Discharge status: Home / Self Care | Payer: 59 | Source: Ambulatory Visit | Attending: Orthopedic Surgery | Admitting: Orthopedic Surgery

## 2012-01-24 DIAGNOSIS — I499 Cardiac arrhythmia, unspecified: Secondary | ICD-10-CM

## 2012-01-24 HISTORY — DX: Other specified postprocedural states: R11.2

## 2012-01-24 HISTORY — DX: Other specified postprocedural states: Z98.890

## 2012-01-24 HISTORY — DX: Cardiac arrhythmia, unspecified: I49.9

## 2012-01-24 HISTORY — PX: CATARACT EXTRACTION, BILATERAL: SHX1313

## 2012-01-24 LAB — BASIC METABOLIC PANEL
BUN: 12 mg/dL (ref 6–23)
CO2: 33 mEq/L — ABNORMAL HIGH (ref 19–32)
Calcium: 9 mg/dL (ref 8.4–10.5)
Chloride: 91 mEq/L — ABNORMAL LOW (ref 96–112)
Creatinine, Ser: 0.65 mg/dL (ref 0.50–1.35)
GFR calc Af Amer: >90 mL/min (ref >90)
GFR calc non Af Amer: >90 mL/min (ref >90)
Glucose, Bld: 93 mg/dL (ref 70–99)
Potassium: 3.4 mEq/L — ABNORMAL LOW (ref 3.5–5.1)
Sodium: 132 mEq/L — ABNORMAL LOW (ref 135–145)

## 2012-01-24 LAB — CBC
HCT: 40.4 % (ref 39.0–52.0)
Hemoglobin: 13.8 g/dL (ref 13.0–17.0)
MCH: 31.3 pg (ref 26.0–34.0)
MCHC: 34.2 g/dL (ref 30.0–36.0)
MCV: 91.6 fL (ref 78.0–100.0)
Platelets: 282 10*3/uL (ref 150–400)
RBC: 4.41 MIL/uL (ref 4.22–5.81)
RDW: 13.1 % (ref 11.5–15.5)
WBC: 5.5 10*3/uL (ref 4.0–10.5)

## 2012-01-24 LAB — SURGICAL PCR SCREEN
MRSA, PCR: NEGATIVE
Staphylococcus aureus: NEGATIVE

## 2012-01-27 ENCOUNTER — Other Ambulatory Visit: Payer: Self-pay | Admitting: Internal Medicine

## 2012-01-27 NOTE — Telephone Encounter (Signed)
Caller: [name]; Patient Name: [name]; PCP: Adella Hare (Adults only); [facility] Callback Phone Number: [phone]. Patient is wanting to know if he can have a flu shot before having a colonoscopy on Friday 01/31/12 and his major surgery planned for 02/06/12. Please call patient back regarding if it is safe for him to have the shot before being in the hospital for surgery and if so, when he could come in for the appointment.

## 2012-01-31 ENCOUNTER — Ambulatory Visit (AMBULATORY_SURGERY_CENTER): Payer: 59 | Admitting: Gastroenterology

## 2012-01-31 ENCOUNTER — Encounter: Payer: Self-pay | Admitting: Gastroenterology

## 2012-01-31 VITALS — BP 137/64 | HR 80 | Temp 97.8°F | Resp 33 | Ht 67.0 in | Wt 158.0 lb

## 2012-01-31 DIAGNOSIS — Z1211 Encounter for screening for malignant neoplasm of colon: Secondary | ICD-10-CM

## 2012-01-31 DIAGNOSIS — D126 Benign neoplasm of colon, unspecified: Secondary | ICD-10-CM

## 2012-01-31 DIAGNOSIS — K5730 Diverticulosis of large intestine without perforation or abscess without bleeding: Secondary | ICD-10-CM

## 2012-01-31 DIAGNOSIS — Z86010 Personal history of colonic polyps: Secondary | ICD-10-CM

## 2012-01-31 MED ORDER — SODIUM CHLORIDE 0.9 % IV SOLN: 500.0000 mL | INTRAVENOUS | Status: DC

## 2012-01-31 NOTE — Progress Notes (Signed)
Patient did not experience any of the following events: a burn prior to discharge; a fall within the facility; wrong site/side/patient/procedure/implant event; or a hospital transfer or hospital admission upon discharge from the facility. (G8907) Patient did not have preoperative order for IV antibiotic SSI prophylaxis. (G8918)  

## 2012-01-31 NOTE — Patient Instructions (Addendum)
Discharge instructions given with verbal understanding. Handouts on polyps and diverticulosis given. Resume previous medications. YOU HAD AN ENDOSCOPIC PROCEDURE TODAY AT [facility]: Refer to the procedure report that was given to you for any specific questions about what was found during the examination.  If the procedure report does not answer your questions, please call your gastroenterologist to clarify.  If you requested that your care partner not be given the details of your procedure findings, then the procedure report has been included in a sealed envelope for you to review at your convenience later.  YOU SHOULD EXPECT: Some feelings of bloating in the abdomen. Passage of more gas than usual.  Walking can help get rid of the air that was put into your GI tract during the procedure and reduce the bloating. If you had a lower endoscopy (such as a colonoscopy or flexible sigmoidoscopy) you may notice spotting of blood in your stool or on the toilet paper. If you underwent a bowel prep for your procedure, then you may not have a normal bowel movement for a few days.  DIET: Your first meal following the procedure should be a light meal and then it is ok to progress to your normal diet.  A half-sandwich or bowl of soup is an example of a good first meal.  Heavy or fried foods are harder to digest and may make you feel nauseous or bloated.  Likewise meals heavy in dairy and vegetables can cause extra gas to form and this can also increase the bloating.  Drink plenty of fluids but you should avoid alcoholic beverages for 24 hours.  ACTIVITY: Your care partner should take you home directly after the procedure.  You should plan to take it easy, moving slowly for the rest of the day.  You can resume normal activity the day after the procedure however you should NOT DRIVE or use heavy machinery for 24 hours (because of the sedation medicines used during the test).    SYMPTOMS TO REPORT  IMMEDIATELY: A gastroenterologist can be reached at any hour.  During normal business hours, 8:30 AM to 5:00 PM Monday through Friday, call [phone].  After hours and on weekends, please call the GI answering service at [phone] who will take a message and have the physician on call contact you.   Following lower endoscopy (colonoscopy or flexible sigmoidoscopy):  Excessive amounts of blood in the stool  Significant tenderness or worsening of abdominal pains  Swelling of the abdomen that is new, acute  Fever of 100F or higher  FOLLOW UP: If any biopsies were taken you will be contacted by phone or by letter within the next 1-3 weeks.  Call your gastroenterologist if you have not heard about the biopsies in 3 weeks.  Our staff will call the home number listed on your records the next business day following your procedure to check on you and address any questions or concerns that you may have at that time regarding the information given to you following your procedure. This is a courtesy call and so if there is no answer at the home number and we have not heard from you through the emergency physician on call, we will assume that you have returned to your regular daily activities without incident.  SIGNATURES/CONFIDENTIALITY: You and/or your care partner have signed paperwork which will be entered into your electronic medical record.  These signatures attest to the fact that that the information above on your After Visit  Summary has been reviewed and is understood.  Full responsibility of the confidentiality of this discharge information lies with you and/or your care-partner.

## 2012-01-31 NOTE — Op Note (Signed)
[facility]  [address] [place], [address]   COLONOSCOPY PROCEDURE REPORT  PATIENT: John, [name]  MR#: [id number] BIRTHDATE: October 23, 1945 , 43  yrs. old GENDER: Male ENDOSCOPIST: Milus Banister, MD REFERRED PO:LIDCVUD Marquis Lunch, M.D. PROCEDURE DATE:  01/31/2012 PROCEDURE:   Colonoscopy with snare polypectomy ASA CLASS:   Class III INDICATIONS:several colonsocopies with Dr.  Oletta Lamas, most recent was 2007, "fragments of adenoma". MEDICATIONS: MAC sedation, administered by CRNA and propofol (Diprivan) 249m IV  DESCRIPTION OF PROCEDURE:   After the risks benefits and alternatives of the procedure were thoroughly explained, informed consent was obtained.  A digital rectal exam revealed no abnormalities of the rectum.   The LB CF-[id number] endoscope was introduced through the anus and advanced to the cecum, which was identified by both the appendix and ileocecal valve. No adverse events experienced.   The quality of the prep was good, using MoviPrep  The instrument was then slowly withdrawn as the colon was fully examined.  COLON FINDINGS: There were diverticulum throughout his colon, most significant in left side.  There were two sessile polyps, 4-588meach, both were removed with cold snare and both were sento pathology (jar 1).  These were in descending segment.  The examination was otherwise normal.  Retroflexed views revealed no abnormalities. The time to cecum=6 minutes 15 seconds.  Withdrawal time=9 minutes 11 seconds.  The scope was withdrawn and the procedure completed. COMPLICATIONS: There were no complications.  ENDOSCOPIC IMPRESSION: There were diverticulum throughout his colon. There were two small polyp, both were removed and both sent to pathology. The examination was otherwise normal.  RECOMMENDATIONS: 1.  Given your personal history of adenomatous (pre-cancerous) polyps, you will need a repeat colonoscopy in 5 years even if  the polyps removed today are not precancerous. 2.  You will receive a letter within 1-2 weeks with the results of your biopsy as well as final recommendations.  Please call my office if you have not received a letter after 3 weeks.   eSigned:  DaMilus BanisterMD 01/31/2012 8:53 AM

## 2012-01-31 NOTE — Progress Notes (Signed)
Pt does not have his inhaler with him today

## 2012-02-03 ENCOUNTER — Telehealth: Payer: Self-pay | Admitting: *Deleted

## 2012-02-03 NOTE — Telephone Encounter (Signed)
  Follow up Call-  Call back number 01/31/2012  Post procedure Call Back phone  # [phone]  Permission to leave phone message Yes     Left message to call us back if he is experiencing problems or has questions

## 2012-02-06 ENCOUNTER — Ambulatory Visit (HOSPITAL_COMMUNITY): Payer: 59

## 2012-02-06 ENCOUNTER — Encounter (HOSPITAL_COMMUNITY): Payer: Self-pay | Admitting: Anesthesiology

## 2012-02-06 ENCOUNTER — Ambulatory Visit (HOSPITAL_COMMUNITY): Payer: 59 | Admitting: Anesthesiology

## 2012-02-06 ENCOUNTER — Observation Stay (HOSPITAL_COMMUNITY)
Admission: RE | Admit: 2012-02-06 | Discharge: 2012-02-07 | Disposition: A | Discharge status: Home / Self Care | Payer: 59 | Source: Ambulatory Visit | Attending: Orthopedic Surgery | Admitting: Orthopedic Surgery

## 2012-02-06 ENCOUNTER — Inpatient Hospital Stay (HOSPITAL_COMMUNITY): Payer: 59

## 2012-02-06 ENCOUNTER — Encounter (HOSPITAL_COMMUNITY): Payer: Self-pay | Admitting: *Deleted

## 2012-02-06 ENCOUNTER — Encounter (HOSPITAL_COMMUNITY)
Admission: RE | Disposition: A | Discharge status: Home / Self Care | Payer: Self-pay | Source: Ambulatory Visit | Attending: Orthopedic Surgery

## 2012-02-06 DIAGNOSIS — X58XXXA Exposure to other specified factors, initial encounter: Secondary | ICD-10-CM | POA: Insufficient documentation

## 2012-02-06 DIAGNOSIS — Z01810 Encounter for preprocedural cardiovascular examination: Secondary | ICD-10-CM | POA: Insufficient documentation

## 2012-02-06 DIAGNOSIS — Z96619 Presence of unspecified artificial shoulder joint: Secondary | ICD-10-CM | POA: Insufficient documentation

## 2012-02-06 DIAGNOSIS — J449 Chronic obstructive pulmonary disease, unspecified: Secondary | ICD-10-CM | POA: Insufficient documentation

## 2012-02-06 DIAGNOSIS — Z7982 Long term (current) use of aspirin: Secondary | ICD-10-CM | POA: Insufficient documentation

## 2012-02-06 DIAGNOSIS — J441 Chronic obstructive pulmonary disease with (acute) exacerbation: Secondary | ICD-10-CM

## 2012-02-06 DIAGNOSIS — M069 Rheumatoid arthritis, unspecified: Secondary | ICD-10-CM | POA: Insufficient documentation

## 2012-02-06 DIAGNOSIS — M1990 Unspecified osteoarthritis, unspecified site: Secondary | ICD-10-CM | POA: Insufficient documentation

## 2012-02-06 DIAGNOSIS — J4489 Other specified chronic obstructive pulmonary disease: Secondary | ICD-10-CM | POA: Insufficient documentation

## 2012-02-06 DIAGNOSIS — Z79899 Other long term (current) drug therapy: Secondary | ICD-10-CM | POA: Insufficient documentation

## 2012-02-06 DIAGNOSIS — E876 Hypokalemia: Secondary | ICD-10-CM

## 2012-02-06 DIAGNOSIS — I10 Essential (primary) hypertension: Secondary | ICD-10-CM | POA: Insufficient documentation

## 2012-02-06 DIAGNOSIS — I2510 Atherosclerotic heart disease of native coronary artery without angina pectoris: Secondary | ICD-10-CM | POA: Insufficient documentation

## 2012-02-06 DIAGNOSIS — M8430XA Stress fracture, unspecified site, initial encounter for fracture: Principal | ICD-10-CM | POA: Insufficient documentation

## 2012-02-06 DIAGNOSIS — Z01812 Encounter for preprocedural laboratory examination: Secondary | ICD-10-CM | POA: Insufficient documentation

## 2012-02-06 DIAGNOSIS — Z96659 Presence of unspecified artificial knee joint: Secondary | ICD-10-CM | POA: Insufficient documentation

## 2012-02-06 DIAGNOSIS — S42123A Displaced fracture of acromial process, unspecified shoulder, initial encounter for closed fracture: Secondary | ICD-10-CM | POA: Insufficient documentation

## 2012-02-06 HISTORY — PX: HARVEST BONE GRAFT: SHX377

## 2012-02-06 HISTORY — PX: ORIF SHOULDER FRACTURE: SHX5035

## 2012-02-06 SURGERY — OPEN REDUCTION INTERNAL FIXATION (ORIF) SHOULDER FRACTURE
Anesthesia: General | Site: Shoulder | Laterality: Left | Wound class: Clean

## 2012-02-06 MED ORDER — PHENYLEPHRINE HCL 10 MG/ML IJ SOLN
10.0000 mg | INTRAMUSCULAR | Status: DC | PRN
  Administered 2012-02-06: 10 ug/min via INTRAVENOUS

## 2012-02-06 MED ORDER — ONDANSETRON HCL 4 MG PO TABS: 4.0000 mg | ORAL_TABLET | Freq: Four times a day (QID) | ORAL | Status: DC | PRN

## 2012-02-06 MED ORDER — CLINDAMYCIN PHOSPHATE 600 MG/50ML IV SOLN
600.0000 mg | Freq: Four times a day (QID) | INTRAVENOUS | Status: AC
  Administered 2012-02-06 – 2012-02-07 (×3): 600 mg via INTRAVENOUS
  Filled 2012-02-06 (×3): qty 50

## 2012-02-06 MED ORDER — FLUTICASONE PROPIONATE 50 MCG/ACT NA SUSP
2.0000 | Freq: Every day | NASAL | Status: DC
  Administered 2012-02-07: 2 via NASAL
  Filled 2012-02-06: qty 16

## 2012-02-06 MED ORDER — NEOSTIGMINE METHYLSULFATE 1 MG/ML IJ SOLN
INTRAMUSCULAR | Status: DC | PRN
  Administered 2012-02-06: 3 mg via INTRAVENOUS

## 2012-02-06 MED ORDER — PROPOFOL 10 MG/ML IV BOLUS
INTRAVENOUS | Status: DC | PRN
  Administered 2012-02-06: 200 mg via INTRAVENOUS

## 2012-02-06 MED ORDER — ALPRAZOLAM 0.5 MG PO TABS: 0.5000 mg | ORAL_TABLET | Freq: Three times a day (TID) | ORAL | Status: DC | PRN

## 2012-02-06 MED ORDER — POVIDONE-IODINE 7.5 % EX SOLN: Freq: Once | CUTANEOUS | Status: DC

## 2012-02-06 MED ORDER — METOCLOPRAMIDE HCL 10 MG PO TABS: 5.0000 mg | ORAL_TABLET | Freq: Three times a day (TID) | ORAL | Status: DC | PRN

## 2012-02-06 MED ORDER — FUROSEMIDE 20 MG PO TABS
20.0000 mg | ORAL_TABLET | Freq: Every morning | ORAL | Status: DC
  Administered 2012-02-06 – 2012-02-07 (×2): 20 mg via ORAL
  Filled 2012-02-06 (×2): qty 1

## 2012-02-06 MED ORDER — HYDROCHLOROTHIAZIDE 25 MG PO TABS
25.0000 mg | ORAL_TABLET | Freq: Every morning | ORAL | Status: DC
  Administered 2012-02-06 – 2012-02-07 (×2): 25 mg via ORAL
  Filled 2012-02-06 (×2): qty 1

## 2012-02-06 MED ORDER — ROCURONIUM BROMIDE 100 MG/10ML IV SOLN
INTRAVENOUS | Status: DC | PRN
  Administered 2012-02-06: 20 mg via INTRAVENOUS
  Administered 2012-02-06: 50 mg via INTRAVENOUS
  Administered 2012-02-06: 20 mg via INTRAVENOUS

## 2012-02-06 MED ORDER — SIMVASTATIN 40 MG PO TABS
40.0000 mg | ORAL_TABLET | ORAL | Status: DC
Start: 2012-02-06 — End: 2012-02-07
  Administered 2012-02-06: 40 mg via ORAL
  Filled 2012-02-06: qty 1

## 2012-02-06 MED ORDER — PHENYLEPHRINE HCL 10 MG/ML IJ SOLN
INTRAMUSCULAR | Status: DC | PRN
  Administered 2012-02-06 (×3): 40 ug via INTRAVENOUS

## 2012-02-06 MED ORDER — HYDROMORPHONE HCL PF 1 MG/ML IJ SOLN
0.5000 mg | INTRAMUSCULAR | Status: DC | PRN
  Administered 2012-02-06 – 2012-02-07 (×5): 1 mg via INTRAVENOUS
  Filled 2012-02-06 (×5): qty 1

## 2012-02-06 MED ORDER — PREDNISONE 5 MG PO TABS
5.0000 mg | ORAL_TABLET | Freq: Every day | ORAL | Status: DC
  Administered 2012-02-06 – 2012-02-07 (×2): 5 mg via ORAL
  Filled 2012-02-06 (×2): qty 1

## 2012-02-06 MED ORDER — ACETAMINOPHEN 325 MG PO TABS: 650.0000 mg | ORAL_TABLET | Freq: Four times a day (QID) | ORAL | Status: DC | PRN

## 2012-02-06 MED ORDER — ACETAMINOPHEN 650 MG RE SUPP: 650.0000 mg | Freq: Four times a day (QID) | RECTAL | Status: DC | PRN

## 2012-02-06 MED ORDER — ACETAMINOPHEN 10 MG/ML IV SOLN
INTRAVENOUS | Status: DC | PRN
  Administered 2012-02-06: 1000 mg via INTRAVENOUS

## 2012-02-06 MED ORDER — MIDAZOLAM HCL 5 MG/5ML IJ SOLN
INTRAMUSCULAR | Status: DC | PRN
  Administered 2012-02-06: 2 mg via INTRAVENOUS

## 2012-02-06 MED ORDER — CEFAZOLIN SODIUM-DEXTROSE 2-3 GM-% IV SOLR
INTRAVENOUS | Status: AC
  Filled 2012-02-06: qty 50

## 2012-02-06 MED ORDER — ONDANSETRON HCL 4 MG/2ML IJ SOLN
INTRAMUSCULAR | Status: DC | PRN
  Administered 2012-02-06: 4 mg via INTRAVENOUS

## 2012-02-06 MED ORDER — METOCLOPRAMIDE HCL 5 MG/ML IJ SOLN: 5.0000 mg | Freq: Three times a day (TID) | INTRAMUSCULAR | Status: DC | PRN

## 2012-02-06 MED ORDER — CLINDAMYCIN PHOSPHATE 900 MG/50ML IV SOLN
INTRAVENOUS | Status: AC
  Filled 2012-02-06: qty 50

## 2012-02-06 MED ORDER — LACTATED RINGERS IV SOLN: INTRAVENOUS | Status: DC

## 2012-02-06 MED ORDER — BISACODYL 5 MG PO TBEC: 5.0000 mg | DELAYED_RELEASE_TABLET | Freq: Every day | ORAL | Status: DC | PRN

## 2012-02-06 MED ORDER — POLYETHYLENE GLYCOL 3350 17 G PO PACK
17.0000 g | PACK | Freq: Every day | ORAL | Status: DC | PRN
  Filled 2012-02-06: qty 1

## 2012-02-06 MED ORDER — KCL IN DEXTROSE-NACL 20-5-0.45 MEQ/L-%-% IV SOLN
INTRAVENOUS | Status: AC
  Administered 2012-02-06: 1000 mL
  Filled 2012-02-06: qty 1000

## 2012-02-06 MED ORDER — BUPIVACAINE HCL (PF) 0.25 % IJ SOLN
INTRAMUSCULAR | Status: AC
  Filled 2012-02-06: qty 30

## 2012-02-06 MED ORDER — POLYVINYL ALCOHOL 1.4 % OP SOLN
1.0000 [drp] | Freq: Every day | OPHTHALMIC | Status: DC
  Administered 2012-02-06 – 2012-02-07 (×2): 1 [drp] via OPHTHALMIC
  Filled 2012-02-06: qty 15

## 2012-02-06 MED ORDER — CLINDAMYCIN PHOSPHATE 900 MG/50ML IV SOLN
900.0000 mg | INTRAVENOUS | Status: AC
  Administered 2012-02-06: 900 mg via INTRAVENOUS

## 2012-02-06 MED ORDER — LOSARTAN POTASSIUM 50 MG PO TABS
50.0000 mg | ORAL_TABLET | Freq: Every morning | ORAL | Status: DC
  Administered 2012-02-06 – 2012-02-07 (×2): 50 mg via ORAL
  Filled 2012-02-06 (×2): qty 1

## 2012-02-06 MED ORDER — KCL IN DEXTROSE-NACL 20-5-0.45 MEQ/L-%-% IV SOLN
INTRAVENOUS | Status: DC
  Administered 2012-02-06 – 2012-02-07 (×2): via INTRAVENOUS
  Filled 2012-02-06 (×3): qty 1000

## 2012-02-06 MED ORDER — OXYCODONE HCL ER 10 MG PO T12A
10.0000 mg | EXTENDED_RELEASE_TABLET | Freq: Three times a day (TID) | ORAL | Status: DC | PRN
  Administered 2012-02-06: 10 mg via ORAL
  Filled 2012-02-06: qty 1

## 2012-02-06 MED ORDER — ACLIDINIUM BROMIDE 400 MCG/ACT IN AEPB
1.0000 | INHALATION_SPRAY | Freq: Two times a day (BID) | RESPIRATORY_TRACT | Status: DC
  Administered 2012-02-06 – 2012-02-07 (×2): 1 via RESPIRATORY_TRACT

## 2012-02-06 MED ORDER — OXYCODONE HCL 10 MG PO TB12: 10.0000 mg | ORAL_TABLET | Freq: Three times a day (TID) | ORAL | Status: DC | PRN

## 2012-02-06 MED ORDER — PANTOPRAZOLE SODIUM 40 MG PO TBEC
40.0000 mg | DELAYED_RELEASE_TABLET | Freq: Two times a day (BID) | ORAL | Status: DC
  Administered 2012-02-06 – 2012-02-07 (×2): 40 mg via ORAL
  Filled 2012-02-06 (×4): qty 1

## 2012-02-06 MED ORDER — MENTHOL 3 MG MT LOZG: 1.0000 | LOZENGE | OROMUCOSAL | Status: DC | PRN

## 2012-02-06 MED ORDER — POLYETHYL GLYCOL-PROPYL GLYCOL 0.4-0.3 % OP SOLN: 1.0000 [drp] | Freq: Every day | OPHTHALMIC | Status: DC

## 2012-02-06 MED ORDER — FENTANYL CITRATE 0.05 MG/ML IJ SOLN
INTRAMUSCULAR | Status: DC | PRN
  Administered 2012-02-06 (×2): 50 ug via INTRAVENOUS
  Administered 2012-02-06: 100 ug via INTRAVENOUS
  Administered 2012-02-06: 50 ug via INTRAVENOUS

## 2012-02-06 MED ORDER — DIPHENHYDRAMINE HCL 12.5 MG/5ML PO ELIX: 12.5000 mg | ORAL_SOLUTION | ORAL | Status: DC | PRN

## 2012-02-06 MED ORDER — TRAZODONE HCL 50 MG PO TABS
50.0000 mg | ORAL_TABLET | Freq: Every evening | ORAL | Status: DC | PRN
  Filled 2012-02-06: qty 1

## 2012-02-06 MED ORDER — 0.9 % SODIUM CHLORIDE (POUR BTL) OPTIME
TOPICAL | Status: DC | PRN
  Administered 2012-02-06: 1000 mL

## 2012-02-06 MED ORDER — HYDROMORPHONE HCL PF 1 MG/ML IJ SOLN
INTRAMUSCULAR | Status: AC
  Filled 2012-02-06: qty 2

## 2012-02-06 MED ORDER — HYDROMORPHONE HCL PF 1 MG/ML IJ SOLN
INTRAMUSCULAR | Status: DC | PRN
  Administered 2012-02-06 (×2): 1 mg via INTRAVENOUS

## 2012-02-06 MED ORDER — HYDROMORPHONE HCL 2 MG PO TABS
2.0000 mg | ORAL_TABLET | ORAL | Status: DC | PRN
  Administered 2012-02-06 – 2012-02-07 (×3): 2 mg via ORAL
  Filled 2012-02-06 (×3): qty 1

## 2012-02-06 MED ORDER — ASPIRIN EC 325 MG PO TBEC
325.0000 mg | DELAYED_RELEASE_TABLET | Freq: Two times a day (BID) | ORAL | Status: DC
  Administered 2012-02-06 – 2012-02-07 (×2): 325 mg via ORAL
  Filled 2012-02-06 (×4): qty 1

## 2012-02-06 MED ORDER — CELECOXIB 200 MG PO CAPS
200.0000 mg | ORAL_CAPSULE | Freq: Two times a day (BID) | ORAL | Status: DC
Start: 2012-02-06 — End: 2012-02-07
  Administered 2012-02-06 – 2012-02-07 (×2): 200 mg via ORAL
  Filled 2012-02-06 (×3): qty 1

## 2012-02-06 MED ORDER — LACTATED RINGERS IV SOLN
INTRAVENOUS | Status: DC | PRN
  Administered 2012-02-06 (×2): via INTRAVENOUS

## 2012-02-06 MED ORDER — GLYCOPYRROLATE 0.2 MG/ML IJ SOLN
INTRAMUSCULAR | Status: DC | PRN
  Administered 2012-02-06: 0.4 mg via INTRAVENOUS

## 2012-02-06 MED ORDER — FLEET ENEMA 7-19 GM/118ML RE ENEM: 1.0000 | ENEMA | Freq: Once | RECTAL | Status: AC | PRN

## 2012-02-06 MED ORDER — BUPIVACAINE HCL (PF) 0.25 % IJ SOLN
INTRAMUSCULAR | Status: DC | PRN
  Administered 2012-02-06: 20 mL

## 2012-02-06 MED ORDER — PHENOL 1.4 % MT LIQD: 1.0000 | OROMUCOSAL | Status: DC | PRN

## 2012-02-06 MED ORDER — DOCUSATE SODIUM 100 MG PO CAPS
100.0000 mg | ORAL_CAPSULE | Freq: Two times a day (BID) | ORAL | Status: DC
  Administered 2012-02-06 – 2012-02-07 (×3): 100 mg via ORAL
  Filled 2012-02-06 (×4): qty 1

## 2012-02-06 MED ORDER — LIDOCAINE HCL (CARDIAC) 20 MG/ML IV SOLN
INTRAVENOUS | Status: DC | PRN
  Administered 2012-02-06: 60 mg via INTRAVENOUS

## 2012-02-06 MED ORDER — ACETAMINOPHEN 10 MG/ML IV SOLN
INTRAVENOUS | Status: AC
  Filled 2012-02-06: qty 100

## 2012-02-06 MED ORDER — DEXAMETHASONE SODIUM PHOSPHATE 10 MG/ML IJ SOLN
INTRAMUSCULAR | Status: DC | PRN
  Administered 2012-02-06: 10 mg via INTRAVENOUS

## 2012-02-06 MED ORDER — ZOLPIDEM TARTRATE 5 MG PO TABS
5.0000 mg | ORAL_TABLET | Freq: Every evening | ORAL | Status: DC | PRN
  Administered 2012-02-07: 5 mg via ORAL
  Filled 2012-02-06: qty 1

## 2012-02-06 MED ORDER — HYDROMORPHONE HCL PF 1 MG/ML IJ SOLN
0.2500 mg | INTRAMUSCULAR | Status: DC | PRN
  Administered 2012-02-06 (×3): 0.5 mg via INTRAVENOUS

## 2012-02-06 MED ORDER — ALUM & MAG HYDROXIDE-SIMETH 200-200-20 MG/5ML PO SUSP: 30.0000 mL | ORAL | Status: DC | PRN

## 2012-02-06 MED ORDER — COLCHICINE 0.6 MG PO TABS
0.6000 mg | ORAL_TABLET | Freq: Two times a day (BID) | ORAL | Status: DC
Start: 2012-02-06 — End: 2012-02-07
  Administered 2012-02-06 – 2012-02-07 (×3): 0.6 mg via ORAL
  Filled 2012-02-06 (×4): qty 1

## 2012-02-06 MED ORDER — ONDANSETRON HCL 4 MG/2ML IJ SOLN: 4.0000 mg | Freq: Four times a day (QID) | INTRAMUSCULAR | Status: DC | PRN

## 2012-02-06 MED ORDER — GABAPENTIN 300 MG PO CAPS
300.0000 mg | ORAL_CAPSULE | Freq: Three times a day (TID) | ORAL | Status: DC
  Administered 2012-02-06 – 2012-02-07 (×3): 300 mg via ORAL
  Filled 2012-02-06 (×5): qty 1

## 2012-02-06 SURGICAL SUPPLY — 83 items
3.5x8 locking screw ×2 IMPLANT
APL SKNCLS STERI-STRIP NONHPOA (GAUZE/BANDAGES/DRESSINGS)
BANDAGE ADHESIVE 1X3 (GAUZE/BANDAGES/DRESSINGS) ×2 IMPLANT
BENZOIN TINCTURE PRP APPL 2/3 (GAUZE/BANDAGES/DRESSINGS) IMPLANT
BIT DRILL 2.8X5 QR DISP (BIT) ×1 IMPLANT
BLADE HEX COATED 2.75 (ELECTRODE) ×3 IMPLANT
BLADE SURG 15 STRL LF DISP TIS (BLADE) IMPLANT
BLADE SURG 15 STRL SS (BLADE)
CHLORAPREP W/TINT 26ML (MISCELLANEOUS) ×9 IMPLANT
CLOTH BEACON ORANGE TIMEOUT ST (SAFETY) ×3 IMPLANT
CLSR STERI-STRIP ANTIMIC 1/2X4 (GAUZE/BANDAGES/DRESSINGS) ×2 IMPLANT
CONT SPECI 4OZ STER CLIK (MISCELLANEOUS) IMPLANT
COVER MAYO STAND STRL (DRAPES) ×1 IMPLANT
DECANTER SPIKE VIAL GLASS SM (MISCELLANEOUS) IMPLANT
DRAPE C-ARM 42X72 X-RAY (DRAPES) ×4 IMPLANT
DRAPE C-ARMOR (DRAPES) IMPLANT
DRAPE INCISE IOBAN 66X45 STRL (DRAPES) ×6 IMPLANT
DRAPE LAPAROTOMY T 102X78X121 (DRAPES) IMPLANT
DRAPE LAPAROTOMY TRNSV 102X78 (DRAPE) ×2 IMPLANT
DRAPE LG THREE QUARTER DISP (DRAPES) ×3 IMPLANT
DRAPE ORTHO SPLIT 77X108 STRL (DRAPES) ×3
DRAPE POUCH INSTRU U-SHP 10X18 (DRAPES) ×3 IMPLANT
DRAPE SURG 17X11 SM STRL (DRAPES) ×4 IMPLANT
DRAPE SURG ORHT 6 SPLT 77X108 (DRAPES) ×2 IMPLANT
DRAPE U-SHAPE 47X51 STRL (DRAPES) ×4 IMPLANT
DRSG PAD ABDOMINAL 8X10 ST (GAUZE/BANDAGES/DRESSINGS) ×1 IMPLANT
ELECT REM PT RETURN 9FT ADLT (ELECTROSURGICAL) ×3
ELECTRODE REM PT RTRN 9FT ADLT (ELECTROSURGICAL) ×2 IMPLANT
GLOVE BIO SURGEON STRL SZ7 (GLOVE) ×3 IMPLANT
GLOVE BIO SURGEON STRL SZ7.5 (GLOVE) ×3 IMPLANT
GLOVE BIOGEL PI IND STRL 7.0 (GLOVE) ×2 IMPLANT
GLOVE BIOGEL PI IND STRL 8 (GLOVE) ×2 IMPLANT
GLOVE BIOGEL PI INDICATOR 7.0 (GLOVE) ×1
GLOVE BIOGEL PI INDICATOR 8 (GLOVE) ×1
GOWN PREVENTION PLUS XLARGE (GOWN DISPOSABLE) ×9 IMPLANT
KIT BASIN OR (CUSTOM PROCEDURE TRAY) ×3 IMPLANT
NS IRRIG 1000ML POUR BTL (IV SOLUTION) IMPLANT
PACK SHOULDER CUSTOM OPM052 (CUSTOM PROCEDURE TRAY) ×3 IMPLANT
PENCIL BUTTON HOLSTER BLD 10FT (ELECTRODE) IMPLANT
PLATE SCAPULA SPINE LF 6HOLE (Plate) ×2 IMPLANT
SCREW CORT 3.5X16 (Screw) ×1 IMPLANT
SCREW CORT 3.5X18 CO (Screw) IMPLANT
SCREW CORT 3.5X45 CO (Screw) ×2 IMPLANT
SCREW CORTICAL 3.5X38 (Screw) ×1 IMPLANT
SCREW LOCK 3.5X8MM (Screw) ×6 IMPLANT
SLING ARM FOAM STRAP LRG (SOFTGOODS) IMPLANT
SLING ARM FOAM STRAP MED (SOFTGOODS) IMPLANT
SLING ARM FOAM STRAP XLG (SOFTGOODS) IMPLANT
SLING ARM IMMOBILIZER MED (SOFTGOODS) IMPLANT
SLING ULTRA II L (ORTHOPEDIC SUPPLIES) ×2 IMPLANT
SPONGE GAUZE 4X4 12PLY (GAUZE/BANDAGES/DRESSINGS) ×3 IMPLANT
SPONGE LAP 18X18 X RAY DECT (DISPOSABLE) ×3 IMPLANT
SPONGE LAP 4X18 X RAY DECT (DISPOSABLE) IMPLANT
SPONGE SURGIFOAM ABS GEL 100 (HEMOSTASIS) IMPLANT
STAPLER SKIN PROX WIDE 3.9 (STAPLE) IMPLANT
STRIP CLOSURE SKIN 1/2X4 (GAUZE/BANDAGES/DRESSINGS) ×2 IMPLANT
SUCTION FRAZIER TIP 10 FR DISP (SUCTIONS) IMPLANT
SUPPORT WRAP ARM LG (MISCELLANEOUS) ×3 IMPLANT
SUT BONE WAX W31G (SUTURE) IMPLANT
SUT ETHIBOND 2 OS 4 DA (SUTURE) IMPLANT
SUT FIBERWIRE #2 38 T-5 BLUE (SUTURE)
SUT MNCRL AB 3-0 PS2 18 (SUTURE) IMPLANT
SUT MNCRL AB 4-0 PS2 18 (SUTURE) ×3 IMPLANT
SUT PDS AB 0 CT 36 (SUTURE) IMPLANT
SUT PDS AB 2-0 CT2 27 (SUTURE) IMPLANT
SUT VIC AB 0 CT1 18XCR BRD 8 (SUTURE) IMPLANT
SUT VIC AB 0 CT1 27 (SUTURE) ×9
SUT VIC AB 0 CT1 27XBRD ANTBC (SUTURE) ×4 IMPLANT
SUT VIC AB 0 CT1 8-18 (SUTURE)
SUT VIC AB 1 CT1 27 (SUTURE) ×6
SUT VIC AB 1 CT1 27XBRD ANTBC (SUTURE) ×4 IMPLANT
SUT VIC AB 2-0 CT1 27 (SUTURE) ×3
SUT VIC AB 2-0 CT1 TAPERPNT 27 (SUTURE) IMPLANT
SUT VIC AB 2-0 SH 18 (SUTURE) IMPLANT
SUT VIC AB 2-0 SH 27 (SUTURE)
SUT VIC AB 2-0 SH 27X BRD (SUTURE) IMPLANT
SUTURE FIBERWR #2 38 T-5 BLUE (SUTURE) IMPLANT
SYR BULB IRRIGATION 50ML (SYRINGE) IMPLANT
TAPE CLOTH SURG 4X10 WHT LF (GAUZE/BANDAGES/DRESSINGS) ×1 IMPLANT
TOWEL OR 17X26 10 PK STRL BLUE (TOWEL DISPOSABLE) ×6 IMPLANT
TOWEL OR NON WOVEN STRL DISP B (DISPOSABLE) ×3 IMPLANT
WATER STERILE IRR 1000ML POUR (IV SOLUTION) ×3 IMPLANT
YANKAUER SUCT BULB TIP 10FT TU (MISCELLANEOUS) ×3 IMPLANT

## 2012-02-06 NOTE — Anesthesia Preprocedure Evaluation (Addendum)
Anesthesia Evaluation  Patient identified by MRN, date of birth, ID band Patient awake    Reviewed: Allergy & Precautions, H&P , NPO status , Patient's Chart, lab work & pertinent test results, reviewed documented beta blocker date and time   History of Anesthesia Complications (+) PONV  Airway Mallampati: II TM Distance: >3 FB Neck ROM: full    Dental No notable dental hx. (+) Teeth Intact and Dental Advisory Given   Pulmonary neg pulmonary ROS, COPD Mild COPD breath sounds clear to auscultation  Pulmonary exam normal       Cardiovascular hypertension, Pt. on medications + CAD + dysrhythmias Atrial Fibrillation Rhythm:regular Rate:Normal  DES obtuse marginal CA 7/06.  Last cardiolite 8/08 with fixed defect and inducible ischemia.  ER 81%.  Past history AF x 1 which responded to medication.   Neuro/Psych Anxiety Phrenic nerve palsy negative neurological ROS  negative psych ROS   GI/Hepatic negative GI ROS, Neg liver ROS,   Endo/Other  negative endocrine ROS  Renal/GU negative Renal ROS  negative genitourinary   Musculoskeletal  (+) Arthritis -, Rheumatoid disorders,    Abdominal   Peds  Hematology hemechromatosis   Anesthesia Other Findings   Reproductive/Obstetrics negative OB ROS                         Anesthesia Physical Anesthesia Plan  ASA: III  Anesthesia Plan: General   Post-op Pain Management:    Induction: Intravenous  Airway Management Planned: Oral ETT  Additional Equipment:   Intra-op Plan:   Post-operative Plan: Extubation in OR  Informed Consent: I have reviewed the patients History and Physical, chart, labs and discussed the procedure including the risks, benefits and alternatives for the proposed anesthesia with the patient or authorized representative who has indicated his/her understanding and acceptance.   Dental Advisory Given  Plan Discussed with: CRNA and  Surgeon  Anesthesia Plan Comments:         Anesthesia Quick Evaluation

## 2012-02-06 NOTE — H&P (Signed)
[name] is an 66 y.o. male.   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  Past Medical History  Diagnosis Date  . Hx of colonic polyps   . Diverticulosis   . Hypertension   . Osteoarthritis   . 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 CAD (coronary artery disease)     minimal coronary plaque in the LAD and right coronary system. 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 7-06. Last nuclear stress 11-17-06 fixed anterior/ inferior defect, no inducible ischemia, EF 81%  . Allergic rhinitis   . Hx of colonoscopy   . RA (rheumatoid arthritis)   . COPD (chronic obstructive pulmonary disease)   . PONV (postoperative nausea and vomiting)   . Dysrhythmia 01-24-12    past hx. A.Fib x1 episode-responded to med.    Past Surgical History  Procedure Date  . Appendectomy   . Inguinal hernia repair 2008    Right, left remotely  . Total knee arthroplasty     right knee partial 2002  . Tonsillectomy   . Shoulder preplacement 2008    left, partial  . Ptca w/ coated stent lad     Cx secondary to tear  . Foot surgery 11-08    for removal of bone spurs- right foot  . Reconstruction of ankle 6-09    Right- [facility]  . L4-5 diskectomy w/ fusion, cage placement and rods 12-10    [place]  . Partial knee arthroplasty     left  . Total shoulder replacement     right  . Joint replacement   . Cardiac catheterization 01-24-12    coronary stents- no problems  . Cataract extraction, bilateral 01-24-12    Bilateral    Family History  Problem Relation Age of Onset  . Uterine cancer Mother     survivor  . Macular degeneration Mother   . Other Mother     ankle edema  . Lung cancer Mother   . Coronary artery disease Father   . Hypertension Father   . Prostate  cancer Father   . Colon polyps Father   . Thyroid disease Sister   . Heart attack Brother   . Hyperlipidemia Brother   . Other Brother     Schizophrenic  . Diabetes Neg Hx   . Colon cancer Neg Hx   . Esophageal cancer Neg Hx   . Rectal cancer Neg Hx   . Stomach cancer Neg Hx   . Coronary artery disease Maternal Aunt   . Heart attack Maternal Aunt   . Rheum arthritis Sister   . Hemochromatosis Sister    Social History:  reports that he has never smoked. He has never used smokeless tobacco. He reports that he does not drink alcohol or use illicit drugs.  Allergies:  Allergies  Allergen Reactions  . Penicillins Hives, Shortness Of Breath and Rash    REACTION: hives, breathing problems  . Morphine And Related Nausea And Vomiting and Swelling    Medications Prior to Admission  Medication Sig Dispense Refill  . Aclidinium Bromide (TUDORZA PRESSAIR) 400 MCG/ACT AEPB Inhale 1 puff into the lungs 2 (two) times daily.  1 each  11  . adalimumab (HUMIRA) 40 MG/0.8ML injection Inject 40 mg into the skin every 14 (fourteen) days.      [name]  ALPRAZolam (XANAX) 0.5 MG tablet Take 1 tablet (0.5 mg total) by mouth 3 (three) times daily as needed for sleep or anxiety.  90 tablet  5  . aspirin (ASPIRIN EC) 81 MG EC tablet Take 81 mg by mouth every morning.       . celecoxib (CELEBREX) 200 MG capsule Take 200 mg by mouth 2 (two) times daily.      . colchicine 0.6 MG tablet Take 0.6 mg by mouth 2 (two) times daily.      . fluticasone (FLONASE) 50 MCG/ACT nasal spray Place 2 sprays into the nose daily.      . furosemide (LASIX) 20 MG tablet Take 20 mg by mouth every morning.      . gabapentin (NEURONTIN) 300 MG capsule Take 300 mg by mouth 3 (three) times daily. 2 every am, 1 at noon and 2 at bedtime      . hydrochlorothiazide (HYDRODIURIL) 25 MG tablet Take 25 mg by mouth every morning.      [name] losartan (COZAAR) 50 MG tablet Take 50 mg by mouth every morning.      . Melatonin ([employer] MELATONIN) 5 MG TABS  Take 5 mg by mouth daily.        [name] oxyCODONE (OXYCONTIN) 10 MG 12 hr tablet Take 1 tablet (10 mg total) by mouth 3 (three) times daily as needed for pain. FILL on or after 01/23/2012  90 tablet  0  . pantoprazole (PROTONIX) 40 MG tablet TAKE 1 TABLET TWICE A DAY  60 tablet  3  . Polyethyl Glycol-Propyl Glycol (SYSTANE ULTRA) 0.4-0.3 % SOLN Apply 1 drop to eye daily.      . potassium chloride SA (K-DUR,KLOR-CON) 20 MEQ tablet Take 1 tablet (20 mEq total) by mouth 2 (two) times daily.  60 tablet  3  . predniSONE (DELTASONE) 1 MG tablet Take 5 mg by mouth daily.       . simvastatin (ZOCOR) 40 MG tablet Take 40 mg by mouth every 3 (three) days. Every 2-3 days      . traZODone (DESYREL) 50 MG tablet Take 50 mg by mouth at bedtime.      [name] oxyCODONE (OXYCONTIN) 10 MG 12 hr tablet Take 1 tablet (10 mg total) by mouth 3 (three) times daily as needed for pain. FILL on or after 02/23/2012  90 tablet  0  . oxyCODONE (OXYCONTIN) 10 MG 12 hr tablet Take 1 tablet (10 mg total) by mouth 3 (three) times daily as needed for pain. FILL on or after 03/24/2012  90 tablet  0  . oxyCODONE-acetaminophen (PERCOCET/ROXICET) 5-325 MG per tablet Take 1 tablet by mouth every 6 (six) hours as needed. FILL on for after 01/23/2012  120 tablet  0  . oxyCODONE-acetaminophen (PERCOCET/ROXICET) 5-325 MG per tablet Take 1 tablet by mouth every 6 (six) hours as needed. FILL on for after 02/23/2012.  120 tablet  0  . oxyCODONE-acetaminophen (PERCOCET/ROXICET) 5-325 MG per tablet Take 1 tablet by mouth every 6 (six) hours as needed. FILL on for after 03/24/2012.  120 tablet  0    No results found for this or any previous visit (from the past 48 hour(s)). No results found.  Review of Systems  All other systems reviewed and are negative.    Blood pressure 123/76, pulse 92, temperature 97 F (36.1 C), resp. rate 20, SpO2 94.00%. Physical Exam  Constitutional: He is oriented to person, place, and time. He appears well-developed and  well-nourished.  HENT:  Head: Atraumatic.  Eyes: EOM are normal.  Cardiovascular: Intact distal pulses.   Respiratory: Effort normal.  Musculoskeletal:       Right shoulder: He exhibits tenderness and pain.  Neurological: He is alert and oriented to person, place, and time.  Skin: Skin is warm and dry.  Psychiatric: He has a normal mood and affect.     Assessment/Plan L shoulder acromial stress fx Plan ORIF Risks / benefits of surgery discussed Consent on chart  NPO for OR Preop antibiotics   Tanmay Halteman WILLIAM 02/06/2012, 7:24 AM

## 2012-02-06 NOTE — Transfer of Care (Signed)
Immediate Anesthesia Transfer of Care Note  Patient: [name]  Procedure(s) Performed: Procedure(s) (LRB) with comments: OPEN REDUCTION INTERNAL FIXATION (ORIF) SHOULDER FRACTURE (Left) - ORIF of a Left Shoulder Fracture with  Iliac Crest Bone Graft aspiration  HARVEST ILIAC BONE GRAFT () - bone marrow aspirqation   Patient Location: PACU  Anesthesia Type:General  Level of Consciousness: awake, alert , oriented and patient cooperative  Airway & Oxygen Therapy: Patient Spontanous Breathing and Patient connected to face mask oxygen  Post-op Assessment: Report given to PACU RN, Post -op Vital signs reviewed and stable and Patient moving all extremities  Post vital signs: Reviewed and stable  Complications: No apparent anesthesia complications

## 2012-02-06 NOTE — Op Note (Signed)
Procedure(s): OPEN REDUCTION INTERNAL FIXATION (ORIF) SHOULDER FRACTURE Procedure Note  [name] male 66 y.o. 02/06/2012  Procedure(s) and Anesthesia Type:    * OPEN REDUCTION INTERNAL FIXATION (ORIF) acromial spine FRACTURE - General  Surgeon(s) and Role:    * Nita Sells, MD - Primary    * Johnny Bridge, MD - Assisting   Indications:  66 y.o. male s/p acromial spine stress fracture with underlying chronic irreparable rotator cuff tears and a humeral hemiarthroplasty. He went on to have continued pain and dysfunction. He was indicated for surgery to try and restore function of the shoulder and decrease pain.   Surgeon: Nita Sells   Assistants: Marchia Bond M.D. (Dr. Mardelle Matte was essential for retraction, positioning, and assistance with the procedure) Jeanmarie Hubert PA-C  Anesthesia: General endotracheal anesthesia   Procedure Detail   Findings: Anatomic reduction of the fracture with good fixation using an Acumed precontoured locking plate. Local bone graft was used as well as 10 cc of aspirated iliac crest bone marrow.  Estimated Blood Loss:  less than 100 mL         Drains: none  Blood Given: none         Specimens: none        Complications:  * No complications entered in OR log *         Disposition: PACU - hemodynamically stable.         Condition: stable    Procedure:   DESCRIPTION OF PROCEDURE: The patient was identified in preoperative  holding area where I personally marked the operative site after  verifying site, side, and procedure with the patient. The patient was taken back  to the operating room where general anesthesia was induced without  complication and was placed in the lateral decubitus position on a beanbag. The hip was also prepped and draped. The shoulder was prepped to the wrist. The patient did have preoperative antibiotics the appropriate timeout procedure was carried out.  An approximately  10 cm incision was made extending from the scapular spine posteriorly up over the dorsal acromion. Dissection was carried down through subcutaneous tissues to the level of the fascia. Small skin flaps were elevated. The fascia was then split over the scapular spine extending up over the acromion. The fracture site was opened. There is significant amount of clear fluid indicative of pseudoarthrosis. The pseudarthrosis was resected including a significant amount of membrane. No sign of infection. There is some underlying scar tissue which was also resected. The bone edges were carefully cleaned and identified. A rongeur was used to come back to bleeding healthy appearing bone. Bone graft was saved for later use. The dorsal aspect the acromion was exposed a great care was taken not to detach the deltoid origin. The a.c. joint was also not entered. The fracture was manipulated and I felt that I could adequately reduce the fracture and then the Acumed precontoured acromial plate was fixed to the acromion first using 3 locking short screws with good fixation. The plate was then used to facilitate a reduction to the scapular spine. It was noted that the angulation of the fracture was such that less bone contact was being achieved. Therefore a bone cutter was used to adjust the acromion and the ensuing bone fragment was saved to be used as bone graft. This was only about 3 or 4 mm of bone. This allowed for greater surface area of bone contact and the first screw was placed in the  scapular spine in compression mode bringing the fracture together. The remaining scapular spine screws were placed. Great care was taken to sound and ensure that the screws remain within the scapular body. Good fixation was achieved. At this point attention was turned to the iliac crest where a bone marrow aspiration needle was used to percutaneously enter the crest between the inner and outer table and aspirate 10 cc of dark bone marrow. The wound  proximally was then copiously irrigated and bone graft which had been previously gathered during exposure was packed in and around the fracture site. The bone marrow was then used to infiltrate the area and the wound was then closed in layers with 0 Vicryl in a deep fascial layer over the plate, 2-0 Vicryl and 4-0 Monocryl for skin closure. Sterile dressings applied including Steri-Strips 4 x 4's ABDs and tape. The wound was infiltrated with about 10 cc of quarter percent Marcaine without epinephrine the The patient was then allowed to awaken from general anesthesia transferred to the stretcher and taken to the recovery room in stable condition in an abduction sling.  POSTOPERATIVE PLAN: He will be kept overnight for pain control and  antibiotics, and will likely be discharged in the morning in a sling.  He will remain in the sling for about 4-6 weeks postoperatively with the  elbow, wrist, and hand motion only, and then will advance his shoulder  motion once there is some healing seen on x-ray.

## 2012-02-06 NOTE — Preoperative (Signed)
Beta Blockers   Reason not to administer Beta Blockers:Not Applicable 

## 2012-02-06 NOTE — Anesthesia Postprocedure Evaluation (Signed)
  Anesthesia Post-op Note  Patient: [name]  Procedure(s) Performed: Procedure(s) (LRB): OPEN REDUCTION INTERNAL FIXATION (ORIF) SHOULDER FRACTURE (Left) HARVEST ILIAC BONE GRAFT ()  Patient Location: PACU  Anesthesia Type: General  Level of Consciousness: awake and alert   Airway and Oxygen Therapy: Patient Spontanous Breathing  Post-op Pain: mild  Post-op Assessment: Post-op Vital signs reviewed, Patient's Cardiovascular Status Stable, Respiratory Function Stable, Patent Airway and No signs of Nausea or vomiting  Post-op Vital Signs: stable  Complications: No apparent anesthesia complications

## 2012-02-07 ENCOUNTER — Encounter: Payer: Self-pay | Admitting: Gastroenterology

## 2012-02-07 ENCOUNTER — Encounter (HOSPITAL_COMMUNITY): Payer: Self-pay | Admitting: Orthopedic Surgery

## 2012-02-07 DIAGNOSIS — S42123A Displaced fracture of acromial process, unspecified shoulder, initial encounter for closed fracture: Secondary | ICD-10-CM | POA: Diagnosis present

## 2012-02-07 LAB — BASIC METABOLIC PANEL
BUN: 7 mg/dL (ref 6–23)
CO2: 29 mEq/L (ref 19–32)
Calcium: 8.9 mg/dL (ref 8.4–10.5)
Chloride: 95 mEq/L — ABNORMAL LOW (ref 96–112)
Creatinine, Ser: 0.63 mg/dL (ref 0.50–1.35)
GFR calc Af Amer: >90 mL/min (ref >90)
GFR calc non Af Amer: >90 mL/min (ref >90)
Glucose, Bld: 160 mg/dL — ABNORMAL HIGH (ref 70–99)
Potassium: 4.4 mEq/L (ref 3.5–5.1)
Sodium: 133 mEq/L — ABNORMAL LOW (ref 135–145)

## 2012-02-07 LAB — CBC
HCT: 38.6 % — ABNORMAL LOW (ref 39.0–52.0)
Hemoglobin: 13 g/dL (ref 13.0–17.0)
MCH: 31 pg (ref 26.0–34.0)
MCHC: 33.7 g/dL (ref 30.0–36.0)
MCV: 92.1 fL (ref 78.0–100.0)
Platelets: 204 10*3/uL (ref 150–400)
RBC: 4.19 MIL/uL — ABNORMAL LOW (ref 4.22–5.81)
RDW: 13.5 % (ref 11.5–15.5)
WBC: 8.3 10*3/uL (ref 4.0–10.5)

## 2012-02-07 NOTE — Progress Notes (Signed)
PATIENT ID: [name]   1 Day Post-Op Procedure(s) (LRB): OPEN REDUCTION INTERNAL FIXATION (ORIF) SHOULDER FRACTURE (Left) HARVEST ILIAC BONE GRAFT ()  Subjective: Feeling well today. Reports trouble sleeping last night due to frequent urination from Lasix/fluids. Otherwise pain is well controlled. Comfortable in sling. Ready to go home today. No complaints or concerns.  Objective:  Filed Vitals:   02/07/12 0630  BP: 132/79  Pulse: 91  Temp: 98.1 F (36.7 C)  Resp: 18     A&Ox3 L UE dressing clean, dry, intact Wiggles fingers, intact sensation to light touch.  Sling in place.  Labs:   [facility] 02/07/12 0420  HGB 13.0   Basename 02/07/12 0420  WBC 8.3  RBC 4.19*  HCT 38.6*  PLT 204   Basename 02/07/12 0420  NA 133*  K 4.4  CL 95*  CO2 29  BUN 7  CREATININE 0.63  GLUCOSE 160*  CALCIUM 8.9    Assessment and Plan: Day 1 PO ORIF Acromial spine fracture Pain is well controlled, continue current pain mgmt D/c home today Dilaudid 48m prescription in chart FU with Dr. CTamera Puntin 2 weeks  VTE proph: SCDs, ASA 3296mBID

## 2012-02-07 NOTE — Evaluation (Signed)
Occupational Therapy Evaluation Patient Details Name: [name] MRN: [id number] DOB: 02-09-1946 Today's Date: 02/07/2012 Time: 2694-8546 OT Time Calculation (min): 34 min  OT Assessment / Plan / Recommendation Clinical Impression  This 66 year old man was admitted for ORIF due to acromial spine stress fx nonunion in the setting of hemiarthroplasty and cuff deficiency.  All education was completed.  Pt will follow up with Dr. Tamera Punt for further rehab    OT Assessment       Follow Up Recommendations  Other (comment) (will follow up with Dr. Tamera Punt)    Barriers to Discharge      Equipment Recommendations  None recommended by OT    Recommendations for Other Services    Frequency       Precautions / Restrictions Precautions Precautions: Shoulder Type of Shoulder Precautions: sling education:  on x adls; elbow to finger AROM only Restrictions Weight Bearing Restrictions: No   Pertinent Vitals/Pain Pt premedicated.  Rates pain as high--felt a tinge of very high pain then felt better when sling reapplied and tightened.  Used pillows to support arm when out of sling for adls.    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  Verbalize all education.  Pt concerned about pain control and states he heard popping in his shoulder:  not heard by OT.  Informed RN.  Pt did not report any additional pain    OT Diagnosis:    OT Problem List:   OT Treatment Interventions:     OT Goals    Visit Information  Last OT Received On: 02/07/12    Subjective Data      Prior Functioning     Home Living Lives With: Significant other Prior Function Level of Independence: Independent Communication Communication: No difficulties Dominant Hand: Right           Vision/Perception     Cognition       Extremity/Trunk Assessment       Mobility       Shoulder Instructions Donning/doffing shirt without moving shoulder:  (verbalize) Method for sponge bathing under operated UE:  (verbalize) Donning/doffing sling/immobilizer: Caregiver independent with task Correct positioning of sling/immobilizer: Caregiver independent with task ROM for elbow, wrist and digits of operated UE: Independent Sling wearing schedule (on at all times/off for ADL's): Independent Proper positioning of operated UE when showering: Independent Dressing change:  (per pt, dr. Tamera Punt instructed him) Positioning of UE while sleeping:  (verbalize)   Exercise     Balance     End of Session    GO Functional Assessment Tool Used: clinical observation Functional Limitation: Self care Self Care Current Status (E7035): At least 60 percent but less than 80 percent impaired, limited or restricted Self Care Goal Status (K0938): At least 60 percent but less than 80 percent impaired, limited or restricted Self Care Discharge Status [phone]): At least 60 percent but less than 80 percent impaired, limited or restricted   Sheryn Aldaz 02/07/2012, 12:44 PM Lesle Chris, OTR/L [phone] 02/07/2012

## 2012-02-07 NOTE — Discharge Summary (Signed)
Patient ID: [name] MRN: [id number] DOB/AGE: 64-Jun-1947 64 y.o.  Admit date: 02/06/2012 Discharge date: 02/07/2012  Admission Diagnoses:  Principal Problem:  *Fracture of acromion of scapula   Discharge Diagnoses:  Same  Past Medical History  Diagnosis Date  . Hx of colonic polyps   . Diverticulosis   . Hypertension   . Osteoarthritis   . 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 CAD (coronary artery disease)     minimal coronary plaque in the LAD and right coronary system. 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 7-06. Last nuclear stress 11-17-06 fixed anterior/ inferior defect, no inducible ischemia, EF 81%  . Allergic rhinitis   . Hx of colonoscopy   . RA (rheumatoid arthritis)   . COPD (chronic obstructive pulmonary disease)   . PONV (postoperative nausea and vomiting)   . Dysrhythmia 01-24-12    past hx. A.Fib x1 episode-responded to med.    Surgeries: Procedure(s): OPEN REDUCTION INTERNAL FIXATION (ORIF) SHOULDER FRACTURE HARVEST ILIAC BONE GRAFT on 02/06/2012   Consultants:    Discharged Condition: Improved  Hospital Course: [name] is an 66 y.o. male who was admitted 02/06/2012 for operative treatment of acromial spine stress fracture with underlying chronic irreparable rotator cuff tears and humeral hemiarthroplasty. He went on to have continued pain an dysfunction. He was indicated for surgery to try and restore function of the shoulder and decrease pain. After pre-op clearance the patient was taken to the operating room on 02/06/2012 and underwent  Procedure(s): OPEN REDUCTION INTERNAL FIXATION (ORIF) SHOULDER FRACTURE HARVEST ILIAC BONE GRAFT.    Patient was given perioperative antibiotics: Anti-infectives     Start     Dose/Rate Route Frequency Ordered Stop   02/06/12 1400   clindamycin (CLEOCIN) IVPB 600 mg        600 mg 100  mL/hr over 30 Minutes Intravenous Every 6 hours 02/06/12 1206 02/07/12 0212   02/06/12 0548   clindamycin (CLEOCIN) IVPB 900 mg        900 mg 100 mL/hr over 30 Minutes Intravenous 60 min pre-op 02/06/12 0548 02/06/12 0740           Patient was given sequential compression devices, early ambulation, and ASA 347m BID to prevent DVT.  Patient benefited maximally from hospital stay and there were no complications.    Recent vital signs: Patient Vitals for the past 24 hrs:  BP Temp Temp src Pulse Resp SpO2 Height Weight  02/07/12 0630 132/79 mmHg 98.1 F (36.7 C) Oral 91  18  94 % - -  02/07/12 0158 131/77 mmHg 98.4 F (36.9 C) Oral 68  18  94 % - -  02/06/12 2207 134/80 mmHg 98.7 F (37.1 C) Oral 80  18  96 % - -  02/06/12 1800 129/80 mmHg 98.2 F (36.8 C) Oral 76  18  92 % - -  02/06/12 1445 107/64 mmHg 96.9 F (36.1 C) Axillary 85  18  96 % - -  02/06/12 1345 122/75 mmHg 97.5 F (36.4 C) Oral 78  18  94 % - -  02/06/12 1245 118/72 mmHg 97.4 F (36.3 C) Oral 70  20  94 % - -  02/06/12 1143 120/77 mmHg 97.6 F (36.4 C) Oral 82  - 94 % 5' 7"  (1.702 m) 71.215 kg (157 lb)  02/06/12 1130 116/67 mmHg 97.8 F (36.6 C) -  85  14  95 % - -  02/06/12 1115 118/73 mmHg - - 84  15  94 % - -  02/06/12 1100 138/75 mmHg - - 88  9  99 % - -  02/06/12 1045 134/89 mmHg - - 93  21  100 % - -  02/06/12 1038 145/91 mmHg 98.1 F (36.7 C) - 93  24  100 % - -     Recent laboratory studies:  [facility] 02/07/12 0420  WBC 8.3  HGB 13.0  HCT 38.6*  PLT 204  NA 133*  K 4.4  CL 95*  CO2 29  BUN 7  CREATININE 0.63  GLUCOSE 160*  INR --  CALCIUM 8.9     Discharge Medications:     Medication List     As of 02/07/2012  8:15 AM    STOP taking these medications         oxyCODONE-acetaminophen 5-325 MG per tablet   Commonly known as: PERCOCET/ROXICET      TAKE these medications         Aclidinium Bromide 400 MCG/ACT Aepb   Inhale 1 puff into the lungs 2 (two) times daily.       ALPRAZolam 0.5 MG tablet   Commonly known as: XANAX   Take 1 tablet (0.5 mg total) by mouth 3 (three) times daily as needed for sleep or anxiety.      aspirin EC 81 MG EC tablet   Generic drug: aspirin   Take 81 mg by mouth every morning.      celecoxib 200 MG capsule   Commonly known as: CELEBREX   Take 200 mg by mouth 2 (two) times daily.      colchicine 0.6 MG tablet   Take 0.6 mg by mouth 2 (two) times daily.      [facility] MELATONIN 5 MG Tabs   Generic drug: Melatonin   Take 5 mg by mouth daily.      fluticasone 50 MCG/ACT nasal spray   Commonly known as: FLONASE   Place 2 sprays into the nose daily.      furosemide 20 MG tablet   Commonly known as: LASIX   Take 20 mg by mouth every morning.      gabapentin 300 MG capsule   Commonly known as: NEURONTIN   Take 300 mg by mouth 3 (three) times daily. 2 every am, 1 at noon and 2 at bedtime      HUMIRA 40 MG/0.8ML injection   Generic drug: adalimumab   Inject 40 mg into the skin every 14 (fourteen) days.      hydrochlorothiazide 25 MG tablet   Commonly known as: HYDRODIURIL   Take 25 mg by mouth every morning.      losartan 50 MG tablet   Commonly known as: COZAAR   Take 50 mg by mouth every morning.      oxyCODONE 10 MG 12 hr tablet   Commonly known as: OXYCONTIN   Take 1 tablet (10 mg total) by mouth 3 (three) times daily as needed for pain. FILL on or after 01/23/2012      pantoprazole 40 MG tablet   Commonly known as: PROTONIX   TAKE 1 TABLET TWICE A DAY      potassium chloride SA 20 MEQ tablet   Commonly known as: K-DUR,KLOR-CON   Take 1 tablet (20 mEq total) by mouth 2 (two) times daily.      predniSONE 1 MG tablet   Commonly known as: DELTASONE  Take 5 mg by mouth daily.      simvastatin 40 MG tablet   Commonly known as: ZOCOR   Take 40 mg by mouth every 3 (three) days. Every 2-3 days      SYSTANE ULTRA 0.4-0.3 % Soln   Generic drug: Polyethyl Glycol-Propyl Glycol   Apply 1 drop to eye daily.       traZODone 50 MG tablet   Commonly known as: DESYREL   Take 50 mg by mouth at bedtime.        Diagnostic Studies: Dg Shoulder Left  02/06/2012  *RADIOLOGY REPORT*  Clinical Data: Left scapular fracture.  LEFT SHOULDER - 2+ VIEW  Comparison: CT scan dated 12/02/2011  Findings: AP C-arm images demonstrate the patient has undergone open reduction and internal fixation of the acromial fracture of the scapula.  Plate and screws have been inserted.  IMPRESSION: Open reduction and internal fixation of acromial fracture.   Original Report Authenticated By: Lorriane Shire, M.D.    Dg Shoulder Left Port  02/06/2012  *RADIOLOGY REPORT*  Clinical Data: Postop left shoulder.  PORTABLE LEFT SHOULDER - 2+ VIEW  Comparison: I 12/02/2011.  Findings:  Prior left shoulder replacement.  New metallic structure extending over the left humeral head to the acromion.  Widened glenoid now with appearance of glenoid screws in place. Sclerotic appearance of the glenoid.  Visualized lung with pulmonary vascular prominence.  IMPRESSION:  Prior left shoulder replacement.  New metallic structure extending over the left humeral head to the acromion.  Widened glenoid now with appearance of glenoid screws in place. Sclerotic appearance of the glenoid.   Original Report Authenticated By: Genia Del, M.D.    Dg C-arm 61-120 Min-no Report  02/06/2012  CLINICAL DATA: ORIF Left shoulder   C-ARM 61-120 MINUTES  Fluoroscopy was utilized by the requesting physician.  No radiographic  interpretation.      Disposition: 01-Home or Self Care      Discharge Orders    Future Appointments: Provider: Department: Dept Phone: Center:   03/18/2012 10:00 AM Neena Rhymes, MD [facility] [phone] [facility]     Future Orders Please Complete By Expires   Diet - low sodium heart healthy      Call MD / Call 911      Comments:   If you experience chest pain or shortness of breath, CALL 911 and be transported to the hospital emergency room.   If you develope a fever above 101 F, pus (white drainage) or increased drainage or redness at the wound, or calf pain, call your surgeon's office.   Constipation Prevention      Comments:   Drink plenty of fluids.  Prune juice may be helpful.  You may use a stool softener, such as Colace (over the counter) 100 mg twice a day.  Use MiraLax (over the counter) for constipation as needed.   Increase activity slowly as tolerated      Driving restrictions      Comments:   No driving until cleared by physician.   Lifting restrictions      Comments:   No lifting with left arm until cleared by physician.      Follow-up Information    Follow up with Nita Sells, MD. Schedule an appointment as soon as possible for a visit in 2 weeks.   Contact information:   [address], [place] [address] [phone]           Signed:  Grier Mitts 02/07/2012, 8:15 AM

## 2012-02-07 NOTE — Care Management Note (Signed)
    Page 1 of 1   02/07/2012     9:55:38 AM   CARE MANAGEMENT NOTE 02/07/2012  Patient:  [name], [name]   Account Number:  [id number]  Date Initiated:  02/07/2012  Documentation initiated by:  Sunday Spillers  Subjective/Objective Assessment:   66 yo male admitted s/p ORIF left shoulder. PTA lived at home with friend.     Action/Plan:   home when stable   Anticipated DC Date:  02/07/2012   Anticipated DC Plan:  [place]  CM consult      Choice offered to / List presented to:             Status of service:  Completed, signed off Medicare Important Message given?   (If response is "NO", the following Medicare IM given date fields will be blank) Date Medicare IM given:   Date Additional Medicare IM given:    Discharge Disposition:  HOME/SELF CARE  Per UR Regulation:  Reviewed for med. necessity/level of care/duration of stay  If discussed at [place] of Stay Meetings, dates discussed:    Comments:

## 2012-02-24 ENCOUNTER — Other Ambulatory Visit: Payer: Self-pay | Admitting: Orthopedic Surgery

## 2012-03-02 NOTE — Progress Notes (Signed)
Multiple surgeries. Shoulder dome 02/06/12 at [facility]-did stay post op in PCA pump-was not on telemetry. Will need istat Bring all meds and overnight bag

## 2012-03-03 NOTE — Progress Notes (Signed)
Reviewed with dr Rhona Raider for dsc

## 2012-03-04 ENCOUNTER — Encounter (HOSPITAL_BASED_OUTPATIENT_CLINIC_OR_DEPARTMENT_OTHER)
Admission: RE | Admit: 2012-03-04 | Discharge: 2012-03-04 | Disposition: A | Discharge status: Home / Self Care | Payer: 59 | Source: Ambulatory Visit | Attending: Orthopedic Surgery | Admitting: Orthopedic Surgery

## 2012-03-04 ENCOUNTER — Other Ambulatory Visit: Payer: Self-pay | Admitting: Internal Medicine

## 2012-03-04 LAB — CBC WITH DIFFERENTIAL/PLATELET
Basophils Absolute: 0 10*3/uL (ref 0.0–0.1)
Basophils Relative: 1 % (ref 0–1)
Eosinophils Absolute: 0.1 10*3/uL (ref 0.0–0.7)
Eosinophils Relative: 1 % (ref 0–5)
HCT: 38.5 % — ABNORMAL LOW (ref 39.0–52.0)
Hemoglobin: 12.9 g/dL — ABNORMAL LOW (ref 13.0–17.0)
Lymphocytes Relative: 18 % (ref 12–46)
Lymphs Abs: 1 10*3/uL (ref 0.7–4.0)
MCH: 31 pg (ref 26.0–34.0)
MCHC: 33.5 g/dL (ref 30.0–36.0)
MCV: 92.5 fL (ref 78.0–100.0)
Monocytes Absolute: 0.5 10*3/uL (ref 0.1–1.0)
Monocytes Relative: 9 % (ref 3–12)
Neutro Abs: 4 10*3/uL (ref 1.7–7.7)
Neutrophils Relative %: 71 % (ref 43–77)
Platelets: 246 10*3/uL (ref 150–400)
RBC: 4.16 MIL/uL — ABNORMAL LOW (ref 4.22–5.81)
RDW: 13.5 % (ref 11.5–15.5)
WBC: 5.6 10*3/uL (ref 4.0–10.5)

## 2012-03-04 LAB — APTT: aPTT: 41 seconds — ABNORMAL HIGH (ref 24–37)

## 2012-03-04 LAB — URINALYSIS, ROUTINE W REFLEX MICROSCOPIC
Bilirubin Urine: NEGATIVE
Glucose, UA: NEGATIVE mg/dL
Hgb urine dipstick: NEGATIVE
Ketones, ur: NEGATIVE mg/dL
Leukocytes, UA: NEGATIVE
Nitrite: NEGATIVE
Protein, ur: NEGATIVE mg/dL
Specific Gravity, Urine: 1.006 (ref 1.005–1.030)
Urobilinogen, UA: 0.2 mg/dL (ref 0.0–1.0)
pH: 7 (ref 5.0–8.0)

## 2012-03-04 LAB — COMPREHENSIVE METABOLIC PANEL
ALT: 43 U/L (ref 0–53)
AST: 33 U/L (ref 0–37)
Albumin: 3.7 g/dL (ref 3.5–5.2)
Alkaline Phosphatase: 108 U/L (ref 39–117)
BUN: 10 mg/dL (ref 6–23)
CO2: 32 mEq/L (ref 19–32)
Calcium: 9.2 mg/dL (ref 8.4–10.5)
Chloride: 97 mEq/L (ref 96–112)
Creatinine, Ser: 0.77 mg/dL (ref 0.50–1.35)
GFR calc Af Amer: >90 mL/min (ref >90)
GFR calc non Af Amer: >90 mL/min (ref >90)
Glucose, Bld: 116 mg/dL — ABNORMAL HIGH (ref 70–99)
Potassium: 3.8 mEq/L (ref 3.5–5.1)
Sodium: 136 mEq/L (ref 135–145)
Total Bilirubin: 0.5 mg/dL (ref 0.3–1.2)
Total Protein: 6.8 g/dL (ref 6.0–8.3)

## 2012-03-04 LAB — PROTIME-INR
INR: 1.13 (ref 0.00–1.49)
Prothrombin Time: 14.3 seconds (ref 11.6–15.2)

## 2012-03-09 ENCOUNTER — Encounter (HOSPITAL_BASED_OUTPATIENT_CLINIC_OR_DEPARTMENT_OTHER)
Admission: RE | Disposition: A | Discharge status: Home / Self Care | Payer: Self-pay | Source: Ambulatory Visit | Attending: Orthopedic Surgery

## 2012-03-09 ENCOUNTER — Encounter (HOSPITAL_BASED_OUTPATIENT_CLINIC_OR_DEPARTMENT_OTHER): Payer: Self-pay | Admitting: Anesthesiology

## 2012-03-09 ENCOUNTER — Encounter (HOSPITAL_BASED_OUTPATIENT_CLINIC_OR_DEPARTMENT_OTHER): Payer: Self-pay | Admitting: *Deleted

## 2012-03-09 ENCOUNTER — Ambulatory Visit (HOSPITAL_BASED_OUTPATIENT_CLINIC_OR_DEPARTMENT_OTHER): Payer: 59 | Admitting: Anesthesiology

## 2012-03-09 ENCOUNTER — Ambulatory Visit (HOSPITAL_BASED_OUTPATIENT_CLINIC_OR_DEPARTMENT_OTHER)
Admission: RE | Admit: 2012-03-09 | Discharge: 2012-03-09 | Disposition: A | Discharge status: Home / Self Care | Payer: 59 | Source: Ambulatory Visit | Attending: Orthopedic Surgery | Admitting: Orthopedic Surgery

## 2012-03-09 DIAGNOSIS — I499 Cardiac arrhythmia, unspecified: Secondary | ICD-10-CM | POA: Insufficient documentation

## 2012-03-09 DIAGNOSIS — Z8249 Family history of ischemic heart disease and other diseases of the circulatory system: Secondary | ICD-10-CM | POA: Insufficient documentation

## 2012-03-09 DIAGNOSIS — Z9089 Acquired absence of other organs: Secondary | ICD-10-CM | POA: Insufficient documentation

## 2012-03-09 DIAGNOSIS — Z8371 Family history of colonic polyps: Secondary | ICD-10-CM | POA: Insufficient documentation

## 2012-03-09 DIAGNOSIS — Z83719 Family history of colon polyps, unspecified: Secondary | ICD-10-CM | POA: Insufficient documentation

## 2012-03-09 DIAGNOSIS — Z8042 Family history of malignant neoplasm of prostate: Secondary | ICD-10-CM | POA: Insufficient documentation

## 2012-03-09 DIAGNOSIS — Z801 Family history of malignant neoplasm of trachea, bronchus and lung: Secondary | ICD-10-CM | POA: Insufficient documentation

## 2012-03-09 DIAGNOSIS — S42123A Displaced fracture of acromial process, unspecified shoulder, initial encounter for closed fracture: Secondary | ICD-10-CM

## 2012-03-09 DIAGNOSIS — Z96619 Presence of unspecified artificial shoulder joint: Secondary | ICD-10-CM | POA: Insufficient documentation

## 2012-03-09 DIAGNOSIS — J449 Chronic obstructive pulmonary disease, unspecified: Secondary | ICD-10-CM | POA: Insufficient documentation

## 2012-03-09 DIAGNOSIS — I10 Essential (primary) hypertension: Secondary | ICD-10-CM | POA: Insufficient documentation

## 2012-03-09 DIAGNOSIS — Z8049 Family history of malignant neoplasm of other genital organs: Secondary | ICD-10-CM | POA: Insufficient documentation

## 2012-03-09 DIAGNOSIS — Z860100 Personal history of colon polyps, unspecified: Secondary | ICD-10-CM | POA: Insufficient documentation

## 2012-03-09 DIAGNOSIS — Z472 Encounter for removal of internal fixation device: Secondary | ICD-10-CM | POA: Insufficient documentation

## 2012-03-09 DIAGNOSIS — I2510 Atherosclerotic heart disease of native coronary artery without angina pectoris: Secondary | ICD-10-CM | POA: Insufficient documentation

## 2012-03-09 DIAGNOSIS — M069 Rheumatoid arthritis, unspecified: Secondary | ICD-10-CM | POA: Insufficient documentation

## 2012-03-09 DIAGNOSIS — M1990 Unspecified osteoarthritis, unspecified site: Secondary | ICD-10-CM | POA: Insufficient documentation

## 2012-03-09 DIAGNOSIS — J309 Allergic rhinitis, unspecified: Secondary | ICD-10-CM | POA: Insufficient documentation

## 2012-03-09 DIAGNOSIS — J4489 Other specified chronic obstructive pulmonary disease: Secondary | ICD-10-CM | POA: Insufficient documentation

## 2012-03-09 HISTORY — PX: HARDWARE REMOVAL: SHX979

## 2012-03-09 SURGERY — REMOVAL, HARDWARE
Anesthesia: Choice | Site: Shoulder | Laterality: Left | Wound class: Clean

## 2012-03-09 MED ORDER — FENTANYL CITRATE 0.05 MG/ML IJ SOLN
INTRAMUSCULAR | Status: DC | PRN
  Administered 2012-03-09: 100 ug via INTRAVENOUS

## 2012-03-09 MED ORDER — PROMETHAZINE HCL 12.5 MG PO TABS: 12.5000 mg | ORAL_TABLET | Freq: Four times a day (QID) | ORAL | Status: DC | PRN

## 2012-03-09 MED ORDER — ONDANSETRON HCL 4 MG/2ML IJ SOLN: 4.0000 mg | Freq: Once | INTRAMUSCULAR | Status: DC | PRN

## 2012-03-09 MED ORDER — CLINDAMYCIN PHOSPHATE 900 MG/50ML IV SOLN
900.0000 mg | INTRAVENOUS | Status: AC
  Administered 2012-03-09: 900 mg via INTRAVENOUS

## 2012-03-09 MED ORDER — OXYCODONE HCL 5 MG/5ML PO SOLN: 5.0000 mg | Freq: Once | ORAL | Status: DC | PRN

## 2012-03-09 MED ORDER — POVIDONE-IODINE 7.5 % EX SOLN
Freq: Once | CUTANEOUS | Status: AC
  Administered 2012-03-09: 11:00:00 via TOPICAL

## 2012-03-09 MED ORDER — DEXAMETHASONE SODIUM PHOSPHATE 4 MG/ML IJ SOLN
INTRAMUSCULAR | Status: DC | PRN
  Administered 2012-03-09: 10 mg via INTRAVENOUS

## 2012-03-09 MED ORDER — HYDROMORPHONE HCL 2 MG PO TABS
2.0000 mg | ORAL_TABLET | Freq: Once | ORAL | Status: AC | PRN
  Administered 2012-03-09: 2 mg via ORAL

## 2012-03-09 MED ORDER — KETOROLAC TROMETHAMINE 30 MG/ML IJ SOLN
INTRAMUSCULAR | Status: DC | PRN
  Administered 2012-03-09: 30 mg via INTRAVENOUS

## 2012-03-09 MED ORDER — HYDROMORPHONE HCL PF 1 MG/ML IJ SOLN
0.2500 mg | INTRAMUSCULAR | Status: DC | PRN
  Administered 2012-03-09: 0.5 mg via INTRAVENOUS

## 2012-03-09 MED ORDER — ONDANSETRON HCL 4 MG/2ML IJ SOLN
INTRAMUSCULAR | Status: DC | PRN
  Administered 2012-03-09: 4 mg via INTRAVENOUS

## 2012-03-09 MED ORDER — MIDAZOLAM HCL 2 MG/2ML IJ SOLN: 1.0000 mg | INTRAMUSCULAR | Status: DC | PRN

## 2012-03-09 MED ORDER — OXYCODONE HCL 5 MG PO TABS: 5.0000 mg | ORAL_TABLET | Freq: Once | ORAL | Status: DC | PRN

## 2012-03-09 MED ORDER — LACTATED RINGERS IV SOLN
INTRAVENOUS | Status: DC
  Administered 2012-03-09: 12:00:00 via INTRAVENOUS

## 2012-03-09 MED ORDER — BUPIVACAINE HCL (PF) 0.5 % IJ SOLN
INTRAMUSCULAR | Status: DC | PRN
  Administered 2012-03-09: 10 mL

## 2012-03-09 MED ORDER — FENTANYL CITRATE 0.05 MG/ML IJ SOLN: 50.0000 ug | INTRAMUSCULAR | Status: DC | PRN

## 2012-03-09 MED ORDER — MEPERIDINE HCL 25 MG/ML IJ SOLN: 6.2500 mg | INTRAMUSCULAR | Status: DC | PRN

## 2012-03-09 MED ORDER — PROPOFOL 10 MG/ML IV BOLUS
INTRAVENOUS | Status: DC | PRN
  Administered 2012-03-09: 150 mg via INTRAVENOUS

## 2012-03-09 MED ORDER — MIDAZOLAM HCL 5 MG/5ML IJ SOLN
INTRAMUSCULAR | Status: DC | PRN
  Administered 2012-03-09: 2 mg via INTRAVENOUS

## 2012-03-09 MED ORDER — SUCCINYLCHOLINE CHLORIDE 20 MG/ML IJ SOLN
INTRAMUSCULAR | Status: DC | PRN
  Administered 2012-03-09: 100 mg via INTRAVENOUS

## 2012-03-09 MED ORDER — OXYCODONE-ACETAMINOPHEN 5-325 MG PO TABS: 1.0000 | ORAL_TABLET | ORAL | Status: DC | PRN

## 2012-03-09 MED ORDER — HYDROMORPHONE HCL 2 MG PO TABS: ORAL_TABLET | ORAL | Status: DC

## 2012-03-09 MED ORDER — LIDOCAINE HCL (CARDIAC) 10 MG/ML IV SOLN
INTRAVENOUS | Status: DC | PRN
  Administered 2012-03-09: 100 mg via INTRAVENOUS

## 2012-03-09 SURGICAL SUPPLY — 68 items
APL SKNCLS STERI-STRIP NONHPOA (GAUZE/BANDAGES/DRESSINGS)
BANDAGE ELASTIC 4 VELCRO ST LF (GAUZE/BANDAGES/DRESSINGS) IMPLANT
BANDAGE ELASTIC 6 VELCRO ST LF (GAUZE/BANDAGES/DRESSINGS) IMPLANT
BANDAGE ESMARK 6X9 LF (GAUZE/BANDAGES/DRESSINGS) IMPLANT
BENZOIN TINCTURE PRP APPL 2/3 (GAUZE/BANDAGES/DRESSINGS) IMPLANT
BLADE SURG 15 STRL LF DISP TIS (BLADE) ×1 IMPLANT
BLADE SURG 15 STRL SS (BLADE) ×2
BNDG CMPR 9X4 STRL LF SNTH (GAUZE/BANDAGES/DRESSINGS)
BNDG CMPR 9X6 STRL LF SNTH (GAUZE/BANDAGES/DRESSINGS)
BNDG COHESIVE 4X5 TAN STRL (GAUZE/BANDAGES/DRESSINGS) IMPLANT
BNDG ESMARK 4X9 LF (GAUZE/BANDAGES/DRESSINGS) IMPLANT
BNDG ESMARK 6X9 LF (GAUZE/BANDAGES/DRESSINGS)
CANISTER SUCTION 1200CC (MISCELLANEOUS) IMPLANT
CHLORAPREP W/TINT 26ML (MISCELLANEOUS) ×2 IMPLANT
CLOTH BEACON ORANGE TIMEOUT ST (SAFETY) ×2 IMPLANT
COVER TABLE BACK 60X90 (DRAPES) IMPLANT
DECANTER SPIKE VIAL GLASS SM (MISCELLANEOUS) IMPLANT
DRAPE C-ARM 42X72 X-RAY (DRAPES) IMPLANT
DRAPE EXTREMITY T 121X128X90 (DRAPE) IMPLANT
DRAPE OEC MINIVIEW 54X84 (DRAPES) IMPLANT
DRAPE U 20/CS (DRAPES) ×4 IMPLANT
DRAPE U-SHAPE 47X51 STRL (DRAPES) IMPLANT
DRAPE U-SHAPE 76X120 STRL (DRAPES) ×2 IMPLANT
DRSG EMULSION OIL 3X3 NADH (GAUZE/BANDAGES/DRESSINGS) ×2 IMPLANT
ELECT REM PT RETURN 9FT ADLT (ELECTROSURGICAL) ×2
ELECTRODE REM PT RTRN 9FT ADLT (ELECTROSURGICAL) ×1 IMPLANT
GAUZE SPONGE 4X4 16PLY XRAY LF (GAUZE/BANDAGES/DRESSINGS) IMPLANT
GLOVE BIO SURGEON STRL SZ7 (GLOVE) ×4 IMPLANT
GLOVE BIOGEL PI IND STRL 7.0 (GLOVE) ×1 IMPLANT
GLOVE BIOGEL PI IND STRL 8 (GLOVE) ×2 IMPLANT
GLOVE BIOGEL PI INDICATOR 7.0 (GLOVE) ×2
GLOVE BIOGEL PI INDICATOR 8 (GLOVE) ×2
GLOVE ECLIPSE 7.5 STRL STRAW (GLOVE) ×6 IMPLANT
GOWN PREVENTION PLUS XLARGE (GOWN DISPOSABLE) ×3 IMPLANT
GOWN PREVENTION PLUS XXLARGE (GOWN DISPOSABLE) ×2 IMPLANT
NEEDLE HYPO 22GX1.5 SAFETY (NEEDLE) IMPLANT
NS IRRIG 1000ML POUR BTL (IV SOLUTION) ×2 IMPLANT
PACK ARTHROSCOPY DSU (CUSTOM PROCEDURE TRAY) ×1 IMPLANT
PACK BASIN DAY SURGERY FS (CUSTOM PROCEDURE TRAY) ×2 IMPLANT
PAD CAST 4YDX4 CTTN HI CHSV (CAST SUPPLIES) IMPLANT
PADDING CAST ABS 4INX4YD NS (CAST SUPPLIES)
PADDING CAST ABS COTTON 4X4 ST (CAST SUPPLIES) ×1 IMPLANT
PADDING CAST COTTON 4X4 STRL (CAST SUPPLIES)
PADDING CAST COTTON 6X4 STRL (CAST SUPPLIES) IMPLANT
PENCIL BUTTON HOLSTER BLD 10FT (ELECTRODE) ×2 IMPLANT
SPLINT FAST PLASTER 5X30 (CAST SUPPLIES)
SPLINT PLASTER CAST FAST 5X30 (CAST SUPPLIES) IMPLANT
SPONGE GAUZE 4X4 12PLY (GAUZE/BANDAGES/DRESSINGS) ×4 IMPLANT
SPONGE LAP 4X18 X RAY DECT (DISPOSABLE) ×1 IMPLANT
STAPLER VISISTAT (STAPLE) IMPLANT
STOCKINETTE 6  STRL (DRAPES)
STOCKINETTE 6 STRL (DRAPES) ×1 IMPLANT
STOCKINETTE IMPERVIOUS LG (DRAPES) IMPLANT
STRIP CLOSURE SKIN 1/2X4 (GAUZE/BANDAGES/DRESSINGS) ×2 IMPLANT
SUCTION FRAZIER TIP 10 FR DISP (SUCTIONS) ×1 IMPLANT
SUT ETHILON 4 0 PS 2 18 (SUTURE) IMPLANT
SUT MON AB 4-0 PC3 18 (SUTURE) ×1 IMPLANT
SUT VIC AB 0 CT3 27 (SUTURE) ×1 IMPLANT
SUT VIC AB 2-0 SH 27 (SUTURE) ×2
SUT VIC AB 2-0 SH 27XBRD (SUTURE) ×1 IMPLANT
SUT VIC AB 3-0 FS2 27 (SUTURE) IMPLANT
SUT VICRYL 4-0 PS2 18IN ABS (SUTURE) IMPLANT
SYR 20CC LL (SYRINGE) IMPLANT
SYR BULB 3OZ (MISCELLANEOUS) ×2 IMPLANT
TOWEL OR NON WOVEN STRL DISP B (DISPOSABLE) ×2 IMPLANT
TUBE CONNECTING 20X1/4 (TUBING) ×1 IMPLANT
UNDERPAD 30X30 INCONTINENT (UNDERPADS AND DIAPERS) ×2 IMPLANT
WATER STERILE IRR 1000ML POUR (IV SOLUTION) ×1 IMPLANT

## 2012-03-09 NOTE — Op Note (Signed)
Procedure(s): HARDWARE REMOVAL Procedure Note  JANIS CUFFE male 66 y.o. 03/09/2012  Procedure(s) and Anesthesia Type:    * HARDWARE REMOVAL left shoulder - Choice  Surgeon(s) and Role:    * Nita Sells, MD - Primary   Indications:  66 y.o. male s/p ORIF left acromial stress fracture with early failure of plate fixation .  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    Surgeon: Nita Sells   Assistants: Jeanmarie Hubert PA-C  Anesthesia: General endotracheal anesthesia    Procedure Detail  HARDWARE REMOVAL  Findings: The plate and all screws were removed without difficulty. No signs of infection.  Estimated Blood Loss:  Minimal         Drains: none  Blood Given: none         Specimens: none        Complications:  * No complications entered in OR log *         Disposition: PACU - hemodynamically stable.         Condition: stable    Procedure:   DESCRIPTION OF PROCEDURE: The patient was identified in preoperative  holding area where I personally marked the operative site after  verifying site, side, and procedure with the patient. The patient was taken back  to the operating room where general anesthesia was induced without  complication and was placed in the lateral decubitus position with the left side up. The extremity was prepped and draped in standard sterile fashion. The patient received preoperative antibiotics. The previously made incision was marked and the medial 5 cm incision was sharply opened. Dissection was carried down to the level of the plate which is identified. There is small amount of serous sanguinous fluid which was benign appearing. The plate was removed after removing the 3 medial screws without difficulty. The locking screws came out with the plate distally. The wound was copiously irrigated with normal saline. The ends  of the nonunion site were palpated and found to be benign and smooth. The wound is then closed in layers with 0 Vicryl in a deep layer and 2-0 Vicryl and 4-0 Monocryl for skin closure. The wound is infiltrated with about 10 cc of half percent Marcaine without epinephrine and then a light dressing was applied. The patient was allowed to awaken from anesthesia transferred to the stretcher and taken to recovery room in stable condition.  Postoperative plan: He'll be discharged home today with his family. He will have a sling for comfort. He'll followup in about 10 days for wound check.

## 2012-03-09 NOTE — H&P (Signed)
[name] is an 66 y.o. male.   Chief Complaint: L shoulder pain HPI: S/p L shoulder ORIF acromial stress fracture with plate failure  Past Medical History  Diagnosis Date  . Hx of colonic polyps   . Diverticulosis   . Hypertension   . Osteoarthritis   . 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 CAD (coronary artery disease)     minimal coronary plaque in the LAD and right coronary system. 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 7-06. Last nuclear stress 11-17-06 fixed anterior/ inferior defect, no inducible ischemia, EF 81%  . Allergic rhinitis   . Hx of colonoscopy   . RA (rheumatoid arthritis)   . COPD (chronic obstructive pulmonary disease)   . PONV (postoperative nausea and vomiting)   . Dysrhythmia 01-24-12    past hx. A.Fib x1 episode-responded to med.    Past Surgical History  Procedure Date  . Appendectomy   . Inguinal hernia repair 2008    Right, left remotely  . Total knee arthroplasty     right knee partial 2002  . Tonsillectomy   . Shoulder preplacement 2008    left, partial  . Ptca w/ coated stent lad     Cx secondary to tear  . Foot surgery 11-08    for removal of bone spurs- right foot  . Reconstruction of ankle 6-09    Right- [facility]  . L4-5 diskectomy w/ fusion, cage placement and rods 12-10    [place]  . Partial knee arthroplasty     left  . Total shoulder replacement     right  . Joint replacement   . Cardiac catheterization 01-24-12    coronary stents- no problems  . Cataract extraction, bilateral 01-24-12    Bilateral  . Orif shoulder fracture 02/06/2012    Procedure: OPEN REDUCTION INTERNAL FIXATION (ORIF) SHOULDER FRACTURE;  Surgeon: Nita Sells, MD;  Location: [facility] ORS;  Service: Orthopedics;  Laterality: Left;  ORIF of a Left Shoulder Fracture with  Iliac Crest Bone Graft aspiration   . Harvest bone graft 02/06/2012      Procedure: HARVEST ILIAC BONE GRAFT;  Surgeon: Nita Sells, MD;  Location: [facility]S;  Service: Orthopedics;;  bone marrow aspirqation     Family History  Problem Relation Age of Onset  . Uterine cancer Mother     survivor  . Macular degeneration Mother   . Other Mother     ankle edema  . Lung cancer Mother   . Coronary artery disease Father   . Hypertension Father   . Prostate cancer Father   . Colon polyps Father   . Thyroid disease Sister   . Heart attack Brother   . Hyperlipidemia Brother   . Other Brother     Schizophrenic  . Diabetes Neg Hx   . Colon cancer Neg Hx   . Esophageal cancer Neg Hx   . Rectal cancer Neg Hx   . Stomach cancer Neg Hx   . Coronary artery disease Maternal Aunt   . Heart attack Maternal Aunt   . Rheum arthritis Sister   . Hemochromatosis Sister    Social History:  reports that he has never smoked. He has never used smokeless tobacco. He reports that he does not drink alcohol or use illicit drugs.  Allergies:  Allergies  Allergen Reactions  . Penicillins Hives, Shortness Of  Breath and Rash    REACTION: hives, breathing problems  . Morphine And Related Nausea And Vomiting and Swelling    Medications Prior to Admission  Medication Sig Dispense Refill  . Aclidinium Bromide (TUDORZA PRESSAIR) 400 MCG/ACT AEPB Inhale 1 puff into the lungs 2 (two) times daily.  1 each  11  . ALPRAZolam (XANAX XR) 1 MG 24 hr tablet TAKE 1 TABLET BY MOUTH EVERY DAY  30 tablet  5  . ALPRAZolam (XANAX) 0.5 MG tablet Take 1 tablet (0.5 mg total) by mouth 3 (three) times daily as needed for sleep or anxiety.  90 tablet  5  . celecoxib (CELEBREX) 200 MG capsule Take 200 mg by mouth 2 (two) times daily.      . colchicine 0.6 MG tablet Take 0.6 mg by mouth 2 (two) times daily.      . fluticasone (FLONASE) 50 MCG/ACT nasal spray Place 2 sprays into the nose daily.      . furosemide (LASIX) 20 MG tablet Take 20 mg by mouth every morning.      . gabapentin  (NEURONTIN) 300 MG capsule Take 300 mg by mouth 3 (three) times daily. 2 every am, 1 at noon and 2 at bedtime      . hydrochlorothiazide (HYDRODIURIL) 25 MG tablet Take 25 mg by mouth every morning.      [name] losartan (COZAAR) 50 MG tablet Take 50 mg by mouth every morning.      . Melatonin ([facility]S MELATONIN) 5 MG TABS Take 5 mg by mouth daily.        [name] oxyCODONE (OXYCONTIN) 10 MG 12 hr tablet Take 1 tablet (10 mg total) by mouth 3 (three) times daily as needed for pain. FILL on or after 01/23/2012  90 tablet  0  . pantoprazole (PROTONIX) 40 MG tablet TAKE 1 TABLET TWICE A DAY  60 tablet  3  . Polyethyl Glycol-Propyl Glycol (SYSTANE ULTRA) 0.4-0.3 % SOLN Apply 1 drop to eye daily.      . potassium chloride SA (K-DUR,KLOR-CON) 20 MEQ tablet Take 1 tablet (20 mEq total) by mouth 2 (two) times daily.  60 tablet  3  . predniSONE (DELTASONE) 1 MG tablet Take 5 mg by mouth daily.       . simvastatin (ZOCOR) 40 MG tablet Take 40 mg by mouth every 3 (three) days. Every 2-3 days      . traZODone (DESYREL) 50 MG tablet Take 50 mg by mouth at bedtime.      [name] adalimumab (HUMIRA) 40 MG/0.8ML injection Inject 40 mg into the skin every 14 (fourteen) days.      [name] aspirin (ASPIRIN EC) 81 MG EC tablet Take 81 mg by mouth every morning.         No results found for this or any previous visit (from the past 48 hour(s)). No results found.  Review of Systems  All other systems reviewed and are negative.    Blood pressure 129/83, pulse 91, temperature 97.4 F (36.3 C), temperature source Oral, resp. rate 18, height 5' 7"  (1.702 m), weight 69.117 kg (152 lb 6 oz), SpO2 96.00%. Physical Exam  Constitutional: He is oriented to person, place, and time. He appears well-developed and well-nourished.  HENT:  Head: Atraumatic.  Eyes: EOM are normal.  Cardiovascular: Intact distal pulses.   Respiratory: Effort normal.  Musculoskeletal:       Left shoulder: He exhibits tenderness and swelling.  Neurological: He is alert  and oriented to person, place, and  time.  Skin: Skin is warm and dry.  Psychiatric: He has a normal mood and affect.     Assessment/Plan S/p L shoulder ORIF acromial stress fracture with plate failure Plan removal of plate today. Risks / benefits of surgery discussed Consent on chart  NPO for OR Preop antibiotics   Haidee Stogsdill WILLIAM 03/09/2012, 11:47 AM

## 2012-03-09 NOTE — Transfer of Care (Signed)
Immediate Anesthesia Transfer of Care Note  Patient: [name]  Procedure(s) Performed: Procedure(s) (LRB) with comments: HARDWARE REMOVAL (Left) - Hardware Removal from Left Shoulder  Patient Location: PACU  Anesthesia Type:General  Level of Consciousness: sedated and patient cooperative  Airway & Oxygen Therapy: Patient Spontanous Breathing and Patient connected to face mask oxygen  Post-op Assessment: Report given to PACU RN and Post -op Vital signs reviewed and stable  Post vital signs: Reviewed and stable  Complications: No apparent anesthesia complications

## 2012-03-09 NOTE — Anesthesia Postprocedure Evaluation (Signed)
Anesthesia Post Note  Patient: [name]  Procedure(s) Performed: Procedure(s) (LRB): HARDWARE REMOVAL (Left)  Anesthesia type: general  Patient location: PACU  Post pain: Pain level controlled  Post assessment: Patient's Cardiovascular Status Stable  Last Vitals:  Filed Vitals:   03/09/12 1415  BP: 132/81  Pulse: 81  Temp: 36.6 C  Resp: 18    Post vital signs: Reviewed and stable  Level of consciousness: sedated  Complications: No apparent anesthesia complications

## 2012-03-09 NOTE — Anesthesia Preprocedure Evaluation (Signed)
Anesthesia Evaluation  Patient identified by MRN, date of birth, ID band Patient awake    Reviewed: Allergy & Precautions, H&P , NPO status , Patient's Chart, lab work & pertinent test results  History of Anesthesia Complications (+) PONV  Airway Mallampati: I TM Distance: >3 FB Neck ROM: Full    Dental   Pulmonary          Cardiovascular hypertension, Pt. on medications + CAD + dysrhythmias     Neuro/Psych    GI/Hepatic   Endo/Other    Renal/GU      Musculoskeletal  (+) Arthritis -, Rheumatoid disorders,    Abdominal   Peds  Hematology   Anesthesia Other Findings   Reproductive/Obstetrics                           Anesthesia Physical Anesthesia Plan  ASA: III  Anesthesia Plan: General   Post-op Pain Management:    Induction: Intravenous  Airway Management Planned: Oral ETT  Additional Equipment:   Intra-op Plan:   Post-operative Plan: Extubation in OR  Informed Consent: I have reviewed the patients History and Physical, chart, labs and discussed the procedure including the risks, benefits and alternatives for the proposed anesthesia with the patient or authorized representative who has indicated his/her understanding and acceptance.     Plan Discussed with: CRNA and Surgeon  Anesthesia Plan Comments:         Anesthesia Quick Evaluation

## 2012-03-10 ENCOUNTER — Encounter (HOSPITAL_BASED_OUTPATIENT_CLINIC_OR_DEPARTMENT_OTHER): Payer: Self-pay | Admitting: Orthopedic Surgery

## 2012-03-18 ENCOUNTER — Encounter: Payer: 59 | Admitting: Internal Medicine

## 2012-03-25 ENCOUNTER — Other Ambulatory Visit: Payer: Self-pay | Admitting: Internal Medicine

## 2012-03-26 ENCOUNTER — Other Ambulatory Visit: Payer: Self-pay | Admitting: *Deleted

## 2012-03-26 MED ORDER — CELECOXIB 200 MG PO CAPS: 200.0000 mg | ORAL_CAPSULE | Freq: Two times a day (BID) | ORAL | Status: DC

## 2012-03-26 MED ORDER — PANTOPRAZOLE SODIUM 40 MG PO TBEC: 40.0000 mg | DELAYED_RELEASE_TABLET | Freq: Two times a day (BID) | ORAL | Status: DC

## 2012-03-26 MED ORDER — FLUTICASONE PROPIONATE 50 MCG/ACT NA SUSP: 2.0000 | Freq: Every day | NASAL | Status: DC

## 2012-03-26 MED ORDER — FUROSEMIDE 20 MG PO TABS: 20.0000 mg | ORAL_TABLET | Freq: Every morning | ORAL | Status: DC

## 2012-03-26 NOTE — Telephone Encounter (Signed)
Ok to refill 

## 2012-03-27 ENCOUNTER — Other Ambulatory Visit: Payer: Self-pay | Admitting: *Deleted

## 2012-03-27 ENCOUNTER — Other Ambulatory Visit: Payer: Self-pay | Admitting: Internal Medicine

## 2012-03-27 MED ORDER — FLUOXETINE HCL 40 MG PO CAPS
40.0000 mg | ORAL_CAPSULE | Freq: Every day | ORAL | Status: DC
Start: 2012-03-26 — End: 2012-04-06

## 2012-03-27 MED ORDER — FLUTICASONE PROPIONATE 50 MCG/ACT NA SUSP: 2.0000 | Freq: Every day | NASAL | Status: DC

## 2012-03-27 MED ORDER — ALPRAZOLAM ER 1 MG PO TB24: 1.0000 mg | ORAL_TABLET | ORAL | Status: DC

## 2012-03-27 MED ORDER — FLUOXETINE HCL 40 MG PO CAPS: 40.0000 mg | ORAL_CAPSULE | Freq: Every day | ORAL | Status: DC

## 2012-03-27 MED ORDER — CELECOXIB 200 MG PO CAPS: 200.0000 mg | ORAL_CAPSULE | Freq: Two times a day (BID) | ORAL | Status: DC

## 2012-03-27 MED ORDER — ALPRAZOLAM 0.5 MG PO TABS: 0.5000 mg | ORAL_TABLET | Freq: Three times a day (TID) | ORAL | Status: DC | PRN

## 2012-03-27 MED ORDER — FUROSEMIDE 20 MG PO TABS: 20.0000 mg | ORAL_TABLET | Freq: Every morning | ORAL | Status: DC

## 2012-03-27 NOTE — Telephone Encounter (Signed)
Ok to refill 

## 2012-04-06 ENCOUNTER — Encounter: Payer: Self-pay | Admitting: Internal Medicine

## 2012-04-06 ENCOUNTER — Other Ambulatory Visit: Payer: Self-pay | Admitting: *Deleted

## 2012-04-06 ENCOUNTER — Ambulatory Visit (INDEPENDENT_AMBULATORY_CARE_PROVIDER_SITE_OTHER): Payer: 59 | Admitting: Internal Medicine

## 2012-04-06 ENCOUNTER — Other Ambulatory Visit (INDEPENDENT_AMBULATORY_CARE_PROVIDER_SITE_OTHER): Payer: 59

## 2012-04-06 VITALS — BP 126/72 | HR 79 | Temp 97.5°F | Resp 12 | Ht 66.0 in | Wt 155.1 lb

## 2012-04-06 DIAGNOSIS — F411 Generalized anxiety disorder: Secondary | ICD-10-CM

## 2012-04-06 DIAGNOSIS — I4891 Unspecified atrial fibrillation: Secondary | ICD-10-CM

## 2012-04-06 DIAGNOSIS — Z862 Personal history of diseases of the blood and blood-forming organs and certain disorders involving the immune mechanism: Secondary | ICD-10-CM

## 2012-04-06 DIAGNOSIS — M069 Rheumatoid arthritis, unspecified: Secondary | ICD-10-CM

## 2012-04-06 DIAGNOSIS — Z23 Encounter for immunization: Secondary | ICD-10-CM

## 2012-04-06 DIAGNOSIS — Z0000 Encounter for general adult medical examination without abnormal findings: Secondary | ICD-10-CM

## 2012-04-06 DIAGNOSIS — IMO0002 Reserved for concepts with insufficient information to code with codable children: Secondary | ICD-10-CM

## 2012-04-06 DIAGNOSIS — M5137 Other intervertebral disc degeneration, lumbosacral region: Secondary | ICD-10-CM

## 2012-04-06 DIAGNOSIS — S42123A Displaced fracture of acromial process, unspecified shoulder, initial encounter for closed fracture: Secondary | ICD-10-CM

## 2012-04-06 DIAGNOSIS — M159 Polyosteoarthritis, unspecified: Secondary | ICD-10-CM

## 2012-04-06 DIAGNOSIS — I2510 Atherosclerotic heart disease of native coronary artery without angina pectoris: Secondary | ICD-10-CM

## 2012-04-06 DIAGNOSIS — E785 Hyperlipidemia, unspecified: Secondary | ICD-10-CM

## 2012-04-06 DIAGNOSIS — M51379 Other intervertebral disc degeneration, lumbosacral region without mention of lumbar back pain or lower extremity pain: Secondary | ICD-10-CM

## 2012-04-06 DIAGNOSIS — G8929 Other chronic pain: Secondary | ICD-10-CM

## 2012-04-06 DIAGNOSIS — E876 Hypokalemia: Secondary | ICD-10-CM

## 2012-04-06 DIAGNOSIS — Z2911 Encounter for prophylactic immunotherapy for respiratory syncytial virus (RSV): Secondary | ICD-10-CM

## 2012-04-06 DIAGNOSIS — I10 Essential (primary) hypertension: Secondary | ICD-10-CM

## 2012-04-06 LAB — IBC PANEL
Iron: 102 ug/dL (ref 42–165)
Saturation Ratios: 42.2 % (ref 20.0–50.0)
Transferrin: 172.8 mg/dL — ABNORMAL LOW (ref 212.0–360.0)

## 2012-04-06 MED ORDER — OXYCODONE HCL 20 MG PO TB12: 20.0000 mg | ORAL_TABLET | Freq: Two times a day (BID) | ORAL | Status: DC

## 2012-04-06 MED ORDER — OXYCODONE HCL 20 MG PO TB12: 20.0000 mg | ORAL_TABLET | Freq: Three times a day (TID) | ORAL | Status: DC | PRN

## 2012-04-06 MED ORDER — HYDROMORPHONE HCL 2 MG PO TABS: 2.0000 mg | ORAL_TABLET | ORAL | Status: DC | PRN

## 2012-04-06 MED ORDER — TRAZODONE HCL 50 MG PO TABS: 50.0000 mg | ORAL_TABLET | Freq: Every day | ORAL | Status: DC

## 2012-04-06 MED ORDER — FLUOXETINE HCL 40 MG PO CAPS: 80.0000 mg | ORAL_CAPSULE | Freq: Every day | ORAL | Status: DC

## 2012-04-06 MED ORDER — POTASSIUM CHLORIDE CRYS ER 20 MEQ PO TBCR: 20.0000 meq | EXTENDED_RELEASE_TABLET | Freq: Two times a day (BID) | ORAL | Status: DC

## 2012-04-06 MED ORDER — ALPRAZOLAM ER 1 MG PO TB24: 1.0000 mg | ORAL_TABLET | ORAL | Status: DC

## 2012-04-06 MED ORDER — ALPRAZOLAM 0.5 MG PO TABS: 0.5000 mg | ORAL_TABLET | Freq: Three times a day (TID) | ORAL | Status: DC | PRN

## 2012-04-06 NOTE — Patient Instructions (Addendum)
Pain management - increase oxycontin to 20 mfg every 12 hours and use dilaudid 2-4 mg every 4 hours as needed. If you need frequent breakthrough medication we will increase the oxycontin dose.  OK to take prozac 80 mg once a day for anxiety. Also may take 2nd dose of trazodone at night if needed.   Will get iron studies today.  Please call for appointment with Dr. Joya Salm.

## 2012-04-06 NOTE — Progress Notes (Signed)
Subjective:    Patient ID: [name], male    DOB: 08/31/1945, 66 y.o.   MRN: [id number]  HPI The patient is here for annual wellness examination and management of other chronic and acute problems.  In the interval since has last visit he has had redo left shoulder surgery but had failed ORIF and had to have "hardware" removed. He also has had further vertebral fracture and was seen at [facility] for consultation. He has had progressive pain, including severe pain when supine. Currently on Oxycontin 10 mg tid and breakthru med, best relief with dilaudid 2-4 mg. Discussed surgical options and the advantages of remaining in [facility] for care.    The risk factors are reflected in the social history.  The roster of all physicians providing medical care to patient - is listed in the Snapshot section of the chart.  Activities of daily living:  The patient is 100% inedpendent in all ADLs: dressing, toileting, feeding as well as independent mobility  Home safety : The patient has smoke detectors in the home. They wear seatbelts. No firearms at home   There is no risks for hepatitis, STDs or HIV. There is a history of blood transfusion associated with surgeries. They have no travel history to infectious disease endemic areas of the world.  The patient has seen their dentist in the last six month. They have  seen their eye doctor in the last year. They den any hearing difficulty and have not had audiologic testing in the last year.  They do not  have excessive sun exposure. Discussed the need for sun protection: hats, long sleeves and use of sunscreen if there is significant sun exposure.   Diet: the importance of a healthy diet is discussed. They do have a healthy diet.  The patient has no regular exercise program.  The benefits of regular aerobic exercise were discussed.  Depression screen: there are no signs or vegative symptoms of depression- irritability, change in appetite, anhedonia,  sadness/tearfullness.  Cognitive assessment: the patient manages all their financial and personal affairs and is actively engaged.   The following portions of the patient's history were reviewed and updated as appropriate: allergies, current medications, past family history, past medical history,  past surgical history, past social history  and problem list.  Vision, hearing, body mass index were assessed and reviewed.   During the course of the visit the patient was educated and counseled about appropriate screening and preventive services including : fall prevention , diabetes screening, nutrition counseling, colorectal cancer screening, and recommended immunizations.  Past Medical History  Diagnosis Date  . Hx of colonic polyps   . Diverticulosis   . Hypertension   . Osteoarthritis   . 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 CAD (coronary artery disease)     minimal coronary plaque in the LAD and right coronary system. 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 7-06. Last nuclear stress 11-17-06 fixed anterior/ inferior defect, no inducible ischemia, EF 81%  . Allergic rhinitis   . Hx of colonoscopy   . RA (rheumatoid arthritis)   . COPD (chronic obstructive pulmonary disease)   . PONV (postoperative nausea and vomiting)   . Dysrhythmia 01-24-12    past hx. A.Fib x1 episode-responded to med.   Past Surgical History  Procedure Date  . Appendectomy   . Inguinal hernia repair 2008  Right, left remotely  . Total knee arthroplasty     right knee partial 2002  . Tonsillectomy   . Shoulder preplacement 2008    left, partial  . Ptca w/ coated stent lad     Cx secondary to tear  . Foot surgery 11-08    for removal of bone spurs- right foot  . Reconstruction of ankle 6-09    Right- [facility]  . L4-5 diskectomy w/ fusion, cage placement and rods 12-10    [place]  . Partial knee  arthroplasty     left  . Total shoulder replacement     right  . Joint replacement   . Cardiac catheterization 01-24-12    coronary stents- no problems  . Cataract extraction, bilateral 01-24-12    Bilateral  . Orif shoulder fracture 02/06/2012    Procedure: OPEN REDUCTION INTERNAL FIXATION (ORIF) SHOULDER FRACTURE;  Surgeon: Nita Sells, MD;  Location: [facility];  Service: Orthopedics;  Laterality: Left;  ORIF of a Left Shoulder Fracture with  Iliac Crest Bone Graft aspiration   . Harvest bone graft 02/06/2012    Procedure: HARVEST ILIAC BONE GRAFT;  Surgeon: Nita Sells, MD;  Location: [facility];  Service: Orthopedics;;  bone marrow aspirqation   . Hardware removal 03/09/2012    Procedure: HARDWARE REMOVAL;  Surgeon: Nita Sells, MD;  Location: [place];  Service: Orthopedics;  Laterality: Left;  Hardware Removal from Left Shoulder   Family History  Problem Relation Age of Onset  . Uterine cancer Mother     survivor  . Macular degeneration Mother   . Other Mother     ankle edema  . Lung cancer Mother   . Coronary artery disease Father   . Hypertension Father   . Prostate cancer Father   . Colon polyps Father   . Thyroid disease Sister   . Heart attack Brother   . Hyperlipidemia Brother   . Other Brother     Schizophrenic  . Diabetes Neg Hx   . Colon cancer Neg Hx   . Esophageal cancer Neg Hx   . Rectal cancer Neg Hx   . Stomach cancer Neg Hx   . Coronary artery disease Maternal Aunt   . Heart attack Maternal Aunt   . Rheum arthritis Sister   . Hemochromatosis Sister    History   Social History  . Marital Status: Married    Spouse Name: N/A    Number of Children: 2  . Years of Education: 16   Occupational History  . 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 Smoking status: Never Smoker   . Smokeless tobacco: Never Used  . Alcohol Use: No  . Drug Use: No  . Sexually Active: Yes -- Male  partner(s)   Other Topics Concern  . Not on file   Social History Narrative   [employer] [place]. Married 1972-86yr seperated/divorced. Engaged April '11. 1 son '74; step-daughter '70. 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 During '11 discovered an [employer]- $500,000+ loss. Is handling this OK - will keep the business running.       Review of Systems Constitutional:  Negative for fever, chills, activity change and unexpected weight change.  HEENT:  Negative for hearing loss, ear pain, congestion, neck stiffness and postnasal drip. Negative for sore throat or swallowing  problems. Negative for dental complaints.   Eyes: Negative for vision loss or change in visual acuity.  Respiratory: Negative for chest tightness and wheezing. Negative for DOE.   Cardiovascular: Negative for chest pain or palpitations. No decreased exercise tolerance Gastrointestinal: No change in bowel habit. No bloating or gas. No reflux or indigestion Genitourinary: Negative for urgency, frequency, flank pain and difficulty urinating.  Musculoskeletal: Positive for myalgias, back pain, arthralgias and gait problem.  Neurological: Negative for dizziness, tremors, weakness and headaches.  Hematological: Negative for adenopathy.  Psychiatric/Behavioral: Negative for behavioral problems and dysphoric mood.       Objective:   Physical Exam Filed Vitals:   04/06/12 1005  BP: 126/72  Pulse: 79  Temp: 97.5 F (36.4 C)  Resp: 12   Wt Readings from Last 3 Encounters:  04/06/12 155 lb 1.3 oz (70.344 kg)  03/09/12 152 lb 6 oz (69.117 kg)  03/09/12 152 lb 6 oz (69.117 kg)   Gen'l: Well nourished well developed white male in no acute distress  HEENT: Head: Normocephalic and atraumatic. Right Ear: External ear normal. EAC/TM nl. Left Ear: External ear normal.  EAC/TM nl. Nose: Nose normal.  Mouth/Throat: Oropharynx is clear and moist. Dentition - native, in good repair. No buccal or palatal lesions. Posterior pharynx clear. Eyes: Conjunctivae and sclera clear. EOM intact. Pupils are equal, round, and reactive to light. Right eye exhibits no discharge. Left eye exhibits no discharge. Neck: Normal range of motion. Neck supple. No JVD present. No tracheal deviation present. No thyromegaly present.  Cardiovascular: Normal rate, regular rhythm, no gallop, no friction rub, no murmur heard.      Quiet precordium. 2+ radial and DP pulses . No carotid bruits Pulmonary/Chest: Effort normal. No respiratory distress or increased WOB, no wheezes, no rales. No chest wall deformity or CVAT. Abdomen: Soft. Bowel sounds are normal in all quadrants. He exhibits no distension, no tenderness, no rebound or guarding, No heptosplenomegaly  Genitourinary: deferred  Musculoskeletal: Decreased range of motion shoulders, left more than right. He exhibits no edema distal extremities.       Small and large joints without redness, with minor synovial thickening and DIP/PIP deformity.  range of motion preserved about all small, median with some decrease about the large joints.  Lymphadenopathy:    He has no cervical or supraclavicular adenopathy.  Neurological: He is alert and oriented to person, place, and time. CN II-XII intact. DTRs 2+ and symmetrical biceps, radial and patellar tendons. Cerebellar function normal with no tremor, rigidity, normal gait and station.  Skin: Skin is warm and dry. No rash noted. No erythema.  Psychiatric: He has a normal mood and affect. His behavior is normal. Thought content normal.   Lab Results  Component Value Date   WBC 5.6 03/04/2012   HGB 12.9* 03/04/2012   HCT 38.5* 03/04/2012   PLT 246 03/04/2012   GLUCOSE 116* 03/04/2012   CHOL 100 10/15/2010   TRIG 75.0 10/15/2010   HDL 36.70* 10/15/2010   LDLCALC 48 10/15/2010   ALT 43 03/04/2012   AST 33 03/04/2012   NA 136  03/04/2012   K 3.8 03/04/2012   CL 97 03/04/2012   CREATININE 0.77 03/04/2012   BUN 10 03/04/2012   CO2 32 03/04/2012   TSH 0.46 10/15/2010   PSA 1.95 10/15/2010   INR 1.13 03/04/2012   HGBA1C 5.5 08/07/2011         Assessment & Plan:

## 2012-04-06 NOTE — Telephone Encounter (Signed)
Pt requested refills on these as well. Please advise.

## 2012-04-08 ENCOUNTER — Encounter: Payer: Self-pay | Admitting: Internal Medicine

## 2012-04-08 DIAGNOSIS — G8929 Other chronic pain: Secondary | ICD-10-CM | POA: Insufficient documentation

## 2012-04-08 DIAGNOSIS — Z0000 Encounter for general adult medical examination without abnormal findings: Secondary | ICD-10-CM | POA: Insufficient documentation

## 2012-04-08 NOTE — Assessment & Plan Note (Signed)
Regular rate. On ASA only

## 2012-04-08 NOTE — Assessment & Plan Note (Signed)
Last lipid panel July '12 with excellent control.   Plan - repeat lipid panel at next opportunity

## 2012-04-08 NOTE — Assessment & Plan Note (Signed)
Stable on immunologic therapy - Arava per last note from Dr. Amil Amen but [facility] lists Humira

## 2012-04-08 NOTE — Assessment & Plan Note (Signed)
Stable without cardiac symptoms

## 2012-04-08 NOTE — Assessment & Plan Note (Signed)
Relatively stable - Fe 102, transferrin 172

## 2012-04-08 NOTE — Assessment & Plan Note (Signed)
Progressive back pain due to degenerative disease

## 2012-04-08 NOTE — Assessment & Plan Note (Signed)
BP Readings from Last 3 Encounters:  04/06/12 126/72  03/09/12 132/81  03/09/12 132/81   Good control on present medications

## 2012-04-08 NOTE — Assessment & Plan Note (Signed)
Interval history remarkable for recurrent shoulder issues and surgeries, progressive degenerative disease lumbar spine. He has had no major medical problems. Pain control has been difficult and he has had increased anxiety related to MSK/ortho issues. He has solidified his support relationships. Limited physical exam reveals general medical stability. He is current with colorectal cancer screening. Discussed pros and cons of prostate cancer screening (USPHCTF recommendations reviewed and ACU April '13 recommendations) and he defers evaluation at this time. Immunizations are up to date but he will need to returrn in 2+ weeks for pneumonia vaccine.  In summary - an amazingly positive gentleman who deals with is disease burden with grace and good humor. He will be seen for pain management follow up in 3 months.

## 2012-04-08 NOTE — Assessment & Plan Note (Signed)
Currently recovering from surgery. Limited ROM left shoulder

## 2012-04-08 NOTE — Assessment & Plan Note (Signed)
With increased pain he has had increased anxiety and sleep difficulty  Plan - OK to increase Trazodone 43m to twice a day (bedtime and at 4 hours if difficulty with sleep).  Increase fluoxetine to 80 mg daily.

## 2012-04-08 NOTE — Assessment & Plan Note (Signed)
Patient now with L5 vertebral issues facing possible need for surgical intervention to stabilize the spine. His previous surgeon - Dr. Joya Salm.  Plan -  Consult with Dr. Joya Salm

## 2012-04-08 NOTE — Assessment & Plan Note (Signed)
S/p attempted ORIF/repair with breakdown of prosthesis.

## 2012-04-22 ENCOUNTER — Other Ambulatory Visit: Payer: Self-pay | Admitting: Internal Medicine

## 2012-04-24 ENCOUNTER — Other Ambulatory Visit: Payer: Self-pay | Admitting: Internal Medicine

## 2012-04-24 NOTE — Telephone Encounter (Signed)
Patient left message on triage requesting a new script for Trazodone, says he was told at his physical to increase to 2 16m tablets at night, he would [place] the [place] [facility]

## 2012-04-25 MED ORDER — TRAZODONE HCL 50 MG PO TABS: 50.0000 mg | ORAL_TABLET | Freq: Every day | ORAL | Status: DC

## 2012-04-27 ENCOUNTER — Telehealth: Payer: Self-pay | Admitting: Internal Medicine

## 2012-04-27 MED ORDER — TRAZODONE HCL 100 MG PO TABS: 100.0000 mg | ORAL_TABLET | Freq: Every day | ORAL | Status: DC

## 2012-04-27 NOTE — Telephone Encounter (Signed)
Caller: Torrion/Patient; Phone: [phone]; Reason for Call: Pt stating he had Physical 03/27/2012 and Dr.  Linda Hedges had increased Trazadone from 50 mg to 100 mg.  He has completed that rx he had, and needs rf, but needs new order with the new directions sent to [facility]

## 2012-04-27 NOTE — Telephone Encounter (Signed)
Please read phone note below and advise.

## 2012-04-27 NOTE — Telephone Encounter (Signed)
Did new Rx - can't find a [facility] [address] so sent it to [facility] and left Mr. Sanzone a message.

## 2012-04-28 ENCOUNTER — Telehealth: Payer: Self-pay | Admitting: Internal Medicine

## 2012-04-28 NOTE — Telephone Encounter (Signed)
Pharmacy confirmed receipt

## 2012-04-28 NOTE — Telephone Encounter (Signed)
Called to confirm Trazodone 100 mg was sent to correct pharmacy[facility].  Per Med list, confirmed MD sent Rx 04/27/12 to same pharmacy.

## 2012-05-02 ENCOUNTER — Other Ambulatory Visit: Payer: Self-pay | Admitting: Internal Medicine

## 2012-05-08 ENCOUNTER — Telehealth: Payer: Self-pay | Admitting: *Deleted

## 2012-05-08 NOTE — Telephone Encounter (Signed)
Very reliable patient - ok to reissue Rx's

## 2012-05-08 NOTE — Telephone Encounter (Signed)
Pt came by the office and left a note. He stated in the note that he is moving into a new home. He lost is script for dilauded for 1/30 and 2/30. He states he is feeling a great deal of pain. He would like 2 months to be re-written. He will shred the originals if he finds them. Please advise.

## 2012-05-11 ENCOUNTER — Other Ambulatory Visit: Payer: Self-pay | Admitting: *Deleted

## 2012-05-11 MED ORDER — HYDROMORPHONE HCL 2 MG PO TABS: 2.0000 mg | ORAL_TABLET | ORAL | Status: DC | PRN

## 2012-05-11 NOTE — Telephone Encounter (Signed)
Today, 05/11/2012, pt calling stating he visited the office on 05/08/2012 and filled out documentation that he had lost his rx for Dilaudid for Jan and Feb while moving. Pt is wanting to know  if the Rx's  Are  ready for pick up. RN noted that on 05/08/2012  Dr Linda Hedges advised that  "very reliable patient - ok to reissue Rx's". RN called office and "Izora Gala" advised that the  Rx's are not at the front desk and to send a note. Rn advised pt that follow up note will be sent. OFFICE FOLLOW UP --PLEASE IS OUT OF DILAUDID AND NEEDS TO PICK UP RX'S - PLEASE CALL HIM

## 2012-05-11 NOTE — Telephone Encounter (Signed)
Pt lost rx's. Dr Linda Hedges ok'd replacement prescriptions.

## 2012-05-11 NOTE — Telephone Encounter (Signed)
Called pt at 2:55 pm and lvm telling him that his Rx's are ready for him to pick up.

## 2012-05-19 ENCOUNTER — Other Ambulatory Visit (HOSPITAL_COMMUNITY): Payer: Self-pay | Admitting: Internal Medicine

## 2012-05-19 MED ORDER — LORATADINE 10 MG PO TABS: 10.0000 mg | ORAL_TABLET | Freq: Every day | ORAL | Status: DC

## 2012-05-25 ENCOUNTER — Encounter (HOSPITAL_COMMUNITY)
Admission: RE | Admit: 2012-05-25 | Discharge: 2012-05-25 | Disposition: A | Discharge status: Home / Self Care | Payer: Medicare Other | Source: Ambulatory Visit | Attending: Internal Medicine | Admitting: Internal Medicine

## 2012-05-25 ENCOUNTER — Encounter (HOSPITAL_COMMUNITY): Payer: Self-pay

## 2012-05-25 DIAGNOSIS — M069 Rheumatoid arthritis, unspecified: Secondary | ICD-10-CM | POA: Insufficient documentation

## 2012-05-25 NOTE — Progress Notes (Signed)
Spoke w [name] for Dr Amil Amen. Patient is to have a spinal steroid inj tomorrow. Dr Amil Amen said patient could either do remicade today OR injection tomorrow. And the other in one week.  So patient choose to have remicade next week and inj tomorrow

## 2012-05-26 ENCOUNTER — Other Ambulatory Visit: Payer: Self-pay | Admitting: Internal Medicine

## 2012-06-02 ENCOUNTER — Other Ambulatory Visit: Payer: Self-pay | Admitting: Internal Medicine

## 2012-06-02 ENCOUNTER — Encounter (HOSPITAL_COMMUNITY): Payer: Self-pay

## 2012-06-02 ENCOUNTER — Encounter (HOSPITAL_COMMUNITY)
Admission: RE | Admit: 2012-06-02 | Discharge: 2012-06-02 | Disposition: A | Discharge status: Home / Self Care | Payer: Medicare Other | Source: Ambulatory Visit | Attending: Internal Medicine | Admitting: Internal Medicine

## 2012-06-02 MED ORDER — ACETAMINOPHEN 500 MG PO TABS
1000.0000 mg | ORAL_TABLET | Freq: Once | ORAL | Status: AC
  Administered 2012-06-02: 1000 mg via ORAL
  Filled 2012-06-02: qty 2

## 2012-06-02 MED ORDER — SODIUM CHLORIDE 0.9 % IV SOLN
3.0000 mg/kg | Freq: Once | INTRAVENOUS | Status: AC
  Administered 2012-06-02: 200 mg via INTRAVENOUS
  Filled 2012-06-02: qty 20

## 2012-06-02 MED ORDER — LORATADINE 10 MG PO TABS
10.0000 mg | ORAL_TABLET | Freq: Once | ORAL | Status: AC
  Administered 2012-06-02: 10 mg via ORAL
  Filled 2012-06-02: qty 1

## 2012-06-02 MED ORDER — SODIUM CHLORIDE 0.9 % IV SOLN
Freq: Every day | INTRAVENOUS | Status: DC
  Administered 2012-06-02: 09:00:00 via INTRAVENOUS

## 2012-06-02 NOTE — Progress Notes (Signed)
Tolerated Remicade infusion well. No signs of adverse reaction noted. Verbalizes understanding of discharge instructions and instructed to call Dr. Amil Amen for any concerns following discharge from Short Stay.

## 2012-06-22 ENCOUNTER — Telehealth: Payer: Self-pay | Admitting: Internal Medicine

## 2012-06-22 ENCOUNTER — Ambulatory Visit (INDEPENDENT_AMBULATORY_CARE_PROVIDER_SITE_OTHER): Payer: Medicare Other | Admitting: Internal Medicine

## 2012-06-22 ENCOUNTER — Encounter: Payer: Self-pay | Admitting: Internal Medicine

## 2012-06-22 ENCOUNTER — Other Ambulatory Visit (INDEPENDENT_AMBULATORY_CARE_PROVIDER_SITE_OTHER): Payer: Medicare Other

## 2012-06-22 ENCOUNTER — Encounter (HOSPITAL_COMMUNITY): Payer: Medicare Other

## 2012-06-22 VITALS — BP 128/78 | HR 75 | Temp 97.8°F | Resp 14

## 2012-06-22 DIAGNOSIS — R279 Unspecified lack of coordination: Secondary | ICD-10-CM

## 2012-06-22 DIAGNOSIS — I10 Essential (primary) hypertension: Secondary | ICD-10-CM

## 2012-06-22 DIAGNOSIS — R259 Unspecified abnormal involuntary movements: Secondary | ICD-10-CM

## 2012-06-22 DIAGNOSIS — I2510 Atherosclerotic heart disease of native coronary artery without angina pectoris: Secondary | ICD-10-CM

## 2012-06-22 DIAGNOSIS — I4949 Other premature depolarization: Secondary | ICD-10-CM

## 2012-06-22 DIAGNOSIS — R251 Tremor, unspecified: Secondary | ICD-10-CM | POA: Insufficient documentation

## 2012-06-22 DIAGNOSIS — I493 Ventricular premature depolarization: Secondary | ICD-10-CM | POA: Insufficient documentation

## 2012-06-22 DIAGNOSIS — D649 Anemia, unspecified: Secondary | ICD-10-CM

## 2012-06-22 DIAGNOSIS — R270 Ataxia, unspecified: Secondary | ICD-10-CM | POA: Insufficient documentation

## 2012-06-22 DIAGNOSIS — I4891 Unspecified atrial fibrillation: Secondary | ICD-10-CM

## 2012-06-22 LAB — IBC PANEL
Iron: 98 ug/dL (ref 42–165)
Saturation Ratios: 39.4 % (ref 20.0–50.0)
Transferrin: 177.7 mg/dL — ABNORMAL LOW (ref 212.0–360.0)

## 2012-06-22 LAB — COMPREHENSIVE METABOLIC PANEL
ALT: 23 U/L (ref 0–53)
AST: 20 U/L (ref 0–37)
Albumin: 4.1 g/dL (ref 3.5–5.2)
Alkaline Phosphatase: 61 U/L (ref 39–117)
BUN: 13 mg/dL (ref 6–23)
CO2: 31 mEq/L (ref 19–32)
Calcium: 8.7 mg/dL (ref 8.4–10.5)
Chloride: 92 mEq/L — ABNORMAL LOW (ref 96–112)
Creatinine, Ser: 0.9 mg/dL (ref 0.4–1.5)
GFR: 88.82 mL/min (ref >60.00)
Glucose, Bld: 120 mg/dL — ABNORMAL HIGH (ref 70–99)
Potassium: 3.6 mEq/L (ref 3.5–5.1)
Sodium: 131 mEq/L — ABNORMAL LOW (ref 135–145)
Total Bilirubin: 0.7 mg/dL (ref 0.3–1.2)
Total Protein: 6.9 g/dL (ref 6.0–8.3)

## 2012-06-22 LAB — CBC WITH DIFFERENTIAL/PLATELET
Basophils Absolute: 0 10*3/uL (ref 0.0–0.1)
Basophils Relative: 0.4 % (ref 0.0–3.0)
Eosinophils Absolute: 0 10*3/uL (ref 0.0–0.7)
Eosinophils Relative: 0.2 % (ref 0.0–5.0)
HCT: 42 % (ref 39.0–52.0)
Hemoglobin: 14.4 g/dL (ref 13.0–17.0)
Lymphocytes Relative: 24.5 % (ref 12.0–46.0)
Lymphs Abs: 1.3 10*3/uL (ref 0.7–4.0)
MCHC: 34.2 g/dL (ref 30.0–36.0)
MCV: 88 fl (ref 78.0–100.0)
Monocytes Absolute: 0.7 10*3/uL (ref 0.1–1.0)
Monocytes Relative: 13.4 % — ABNORMAL HIGH (ref 3.0–12.0)
Neutro Abs: 3.2 10*3/uL (ref 1.4–7.7)
Neutrophils Relative %: 61.5 % (ref 43.0–77.0)
Platelets: 230 10*3/uL (ref 150.0–400.0)
RBC: 4.78 Mil/uL (ref 4.22–5.81)
RDW: 14.3 % (ref 11.5–14.6)
WBC: 5.3 10*3/uL (ref 4.5–10.5)

## 2012-06-22 LAB — FERRITIN: Ferritin: 39.4 ng/mL (ref 22.0–322.0)

## 2012-06-22 LAB — VITAMIN B12: Vitamin B-12: 328 pg/mL (ref 211–911)

## 2012-06-22 LAB — SEDIMENTATION RATE: Sed Rate: 4 mm/hr (ref 0–22)

## 2012-06-22 LAB — FOLATE: Folate: 7.4 ng/mL (ref >5.9)

## 2012-06-22 NOTE — Assessment & Plan Note (Signed)
I will recheck his CBC today and will look at his vitamin levels as well

## 2012-06-22 NOTE — Assessment & Plan Note (Addendum)
This is a very complicated scenario, the s/s could be medication induced. Today he agreed to take as few of his pain meds and benzos as he can without feeling any withdrawal symptoms. I have emergently ordered a CT scan of his brain to see if he has had a CVA, will also order an MRI to look at the posterior aspects as well and to see if there is a small lesion that a CT may not detect

## 2012-06-22 NOTE — Telephone Encounter (Signed)
Patient Information:  Caller Name: [name]  Phone: [phone]  Patient: John, [name]  Gender: Male  DOB: [birth year]  Age: 67 Years  PCP: Adella Hare (Adults only)  Office Follow Up:  Does the office need to follow up with this patient?: No  Instructions For The Office: N/A  RN Note:  Unable to schedule with Dr. Linda Hedges in time frame needed for this symptom.  Symptoms  Reason For Call & Symptoms: Dizziness when walking. Can stand, but needs assistance with walking at times. Reports black drainage from right ear.  Reviewed Health History In EMR: Yes  Reviewed Medications In EMR: Yes  Reviewed Allergies In EMR: Yes  Reviewed Surgeries / Procedures: Yes  Date of Onset of Symptoms: 06/18/2012  Treatments Tried: Did not take narcotic pain medication to see if it would help with balance issues.  Treatments Tried Worked: No  Guideline(s) Used:  Dizziness  Disposition Per Guideline:   Go to Office Now  Reason For Disposition Reached:   Lightheadedness (dizziness) present now, after 2 hours of rest and fluids  Advice Given:  N/A  Patient Will Follow Care Advice:  YES  Appointment Scheduled:  06/22/2012 13:45:00 Appointment Scheduled Provider:  Scarlette Calico (Adults only)

## 2012-06-22 NOTE — Patient Instructions (Signed)
Ataxia You have an unsteady walk called ataxia. Your condition may require further tests. Ataxia can be caused by:  Neurological conditions.  Infections.  Physical exhaustion.  Internal bleeding.  Alcohol intoxication, or medication side effects.  Problems with circulation, blood pressure, and heart disease can also make you unsteady. Laboratory and X-ray tests may be needed.  Treatment for now:  Get plenty of rest and eat a nutritious diet over the next weeks.  Avoid alcohol.  If you become very unsteady, dizzy, nauseated, or feel like you are going to faint, lie down flat right away.  Wait until all your symptoms pass before you get up again. SEEK IMMEDIATE MEDICAL CARE IF:  You develop severe unsteadiness, headache, chest pain, or abdominal pain.  You have weakness or numbness on one side of your body.  You have problems with your vision.  You develop confusion or difficulty speaking.  You have a fever, chills, or an irregular heartbeat or a very fast pulse. MAKE SURE YOU:   Understand these instructions.  Will watch your condition.  Will get help right away if you are not doing well or get worse. Document Released: 03/25/2005 Document Revised: 06/17/2011 Document Reviewed: 09/11/2006 [facility] Patient Information 2013 [place].

## 2012-06-22 NOTE — Assessment & Plan Note (Signed)
He has good rate and rhythm control today.

## 2012-06-22 NOTE — Progress Notes (Signed)
Subjective:    Patient ID: [name], male    DOB: [birth year], 67 y.o.   MRN: [id number]  HPI  New to me he complains of a one week history of ataxia, vertigo, dizziness, and blurred vision.  Review of Systems  Constitutional: Negative for fever, chills, diaphoresis, activity change, appetite change, fatigue and unexpected weight change.  HENT: Negative.  Negative for facial swelling, trouble swallowing, neck pain and neck stiffness.   Eyes: Negative.   Respiratory: Negative.  Negative for cough, chest tightness, shortness of breath, wheezing and stridor.   Cardiovascular: Negative for chest pain, palpitations and leg swelling.  Gastrointestinal: Negative for nausea, vomiting, abdominal pain, diarrhea and constipation.  Endocrine: Negative.   Genitourinary: Negative for frequency, flank pain, enuresis and difficulty urinating.  Musculoskeletal: Positive for gait problem. Negative for myalgias, back pain, joint swelling and arthralgias.  Skin: Negative.  Negative for color change, pallor, rash and wound.  Neurological: Positive for dizziness, tremors and weakness ("all over"). Negative for seizures, syncope, facial asymmetry, speech difficulty, light-headedness, numbness and headaches.  Hematological: Negative.  Negative for adenopathy. Does not bruise/bleed easily.  Psychiatric/Behavioral: Negative for suicidal ideas, sleep disturbance, decreased concentration and agitation. The patient is nervous/anxious.        Objective:   Physical Exam  Vitals reviewed. Constitutional: He is oriented to person, place, and time. He appears well-developed and well-nourished. No distress.  HENT:  Head: Normocephalic and atraumatic.  Mouth/Throat: No oropharyngeal exudate.  Eyes: Conjunctivae and EOM are normal. Pupils are equal, round, and reactive to light. Right eye exhibits no discharge. Left eye exhibits no discharge. No scleral icterus.  Neck: Normal range of motion. Neck supple. No  JVD present. No tracheal deviation present. No thyromegaly present.  Cardiovascular: Normal rate, regular rhythm, S1 normal and S2 normal.   Occasional extrasystoles are present. Exam reveals no gallop.   No murmur heard. Pulses:      Carotid pulses are 1+ on the right side, and 1+ on the left side.      Radial pulses are 1+ on the right side, and 1+ on the left side.       Femoral pulses are 1+ on the right side, and 1+ on the left side.      Popliteal pulses are 1+ on the right side, and 1+ on the left side.       Dorsalis pedis pulses are 1+ on the right side, and 1+ on the left side.       Posterior tibial pulses are 1+ on the right side, and 1+ on the left side.  Pulmonary/Chest: Effort normal and breath sounds normal. No stridor. No respiratory distress. He has no wheezes. He has no rales. He exhibits no tenderness.  Abdominal: Soft. Bowel sounds are normal. He exhibits no distension and no mass. There is no tenderness. There is no rebound and no guarding.  Musculoskeletal: Normal range of motion. He exhibits no edema and no tenderness.  Lymphadenopathy:    He has no cervical adenopathy.  Neurological: He is alert and oriented to person, place, and time. He displays tremor and abnormal reflex. He displays no atrophy. No cranial nerve deficit or sensory deficit. He exhibits abnormal muscle tone (LUE weakness). He displays no seizure activity. Coordination abnormal. Gait normal. He displays no Babinski's sign on the right side. He displays no Babinski's sign on the left side.  Reflex Scores:      Tricep reflexes are 0 on the right side and 1+ on  the left side.      Bicep reflexes are 0 on the right side and 1+ on the left side.      Brachioradialis reflexes are 0 on the right side and 1+ on the left side.      Patellar reflexes are 1+ on the right side and 1+ on the left side.      Achilles reflexes are 1+ on the right side and 1+ on the left side. He has a very mildly positive romberg test   Skin: Skin is warm and dry. No rash noted. He is not diaphoretic. No erythema. No pallor.  Psychiatric: He has a normal mood and affect. His behavior is normal. Judgment and thought content normal.      Lab Results  Component Value Date   WBC 5.6 03/04/2012   HGB 12.9* 03/04/2012   HCT 38.5* 03/04/2012   PLT 246 03/04/2012   GLUCOSE 116* 03/04/2012   CHOL 100 10/15/2010   TRIG 75.0 10/15/2010   HDL 36.70* 10/15/2010   LDLCALC 48 10/15/2010   ALT 43 03/04/2012   AST 33 03/04/2012   NA 136 03/04/2012   K 3.8 03/04/2012   CL 97 03/04/2012   CREATININE 0.77 03/04/2012   BUN 10 03/04/2012   CO2 32 03/04/2012   TSH 0.46 10/15/2010   PSA 1.95 10/15/2010   INR 1.13 03/04/2012   HGBA1C 5.5 08/07/2011      Assessment & Plan:

## 2012-06-22 NOTE — Assessment & Plan Note (Signed)
He had 3 unifocal PVC's on his EKG today, he has neuro symptoms but no palpitations or syncope, I think it he is stable enough to see his cardiologist as an outpt

## 2012-06-22 NOTE — Telephone Encounter (Signed)
Patient Information:  Caller Name: [facility]  Phone: [phone]  Patient: [name], [name]  Gender: Male  DOB: 05/25/1945  Age: 67 Years  PCP: Adella Hare (Adults only)  Office Follow Up:  Does the office need to follow up with this patient?: Yes  Instructions For The Office: All appointments are full with Dr. Linda Hedges.  Disposition is "See office Now."  RN Note:  Patient is currently at the dentist and will be done within the next 30 minutes.  Spinning and dizziness is improved if sitting or lying down.  Balance very affected with standing or walking.  Can come in right after dentist appointment this morning.  Symptoms  Reason For Call & Symptoms: Onset 3/11 with chills, low grade fever, diarrhea x 3 days.  Dizziness also started 3/11 in the afternoon and has not improved.  Balance is off, walking into walls.  Better if he is lying down.  Reviewed Health History In EMR: Yes  Reviewed Medications In EMR: Yes  Reviewed Allergies In EMR: Yes  Reviewed Surgeries / Procedures: Yes  Date of Onset of Symptoms: 06/16/2012  Treatments Tried: Stopped all pain medications for 2 days 3/12 and 3/13, then again 3/15 to 3/16.  Treatments Tried Worked: No  Guideline(s) Used:  Dizziness  Disposition Per Guideline:   Go to Office Now  Reason For Disposition Reached:   Spinning or tilting sensation (vertigo) present now  Advice Given:  N/A  Patient Will Follow Care Advice:  YES

## 2012-06-22 NOTE — Assessment & Plan Note (Signed)
His BP is well controlled Today I will check his lytes and renal function 

## 2012-06-22 NOTE — Assessment & Plan Note (Signed)
Neurology referral

## 2012-06-23 ENCOUNTER — Encounter: Payer: Self-pay | Admitting: Internal Medicine

## 2012-06-23 ENCOUNTER — Ambulatory Visit (INDEPENDENT_AMBULATORY_CARE_PROVIDER_SITE_OTHER)
Admission: RE | Admit: 2012-06-23 | Discharge: 2012-06-23 | Disposition: A | Discharge status: Home / Self Care | Payer: Medicare Other | Source: Ambulatory Visit | Attending: Internal Medicine | Admitting: Internal Medicine

## 2012-06-23 ENCOUNTER — Ambulatory Visit: Payer: 59 | Admitting: Cardiovascular Disease

## 2012-06-23 DIAGNOSIS — R270 Ataxia, unspecified: Secondary | ICD-10-CM

## 2012-06-23 DIAGNOSIS — R279 Unspecified lack of coordination: Secondary | ICD-10-CM

## 2012-06-24 ENCOUNTER — Telehealth: Payer: Self-pay | Admitting: Internal Medicine

## 2012-06-24 ENCOUNTER — Encounter: Payer: Self-pay | Admitting: Internal Medicine

## 2012-06-24 ENCOUNTER — Ambulatory Visit (INDEPENDENT_AMBULATORY_CARE_PROVIDER_SITE_OTHER): Payer: Medicare Other | Admitting: Internal Medicine

## 2012-06-24 VITALS — BP 102/72 | HR 76 | Temp 98.1°F | Resp 16 | Wt 155.0 lb

## 2012-06-24 DIAGNOSIS — R279 Unspecified lack of coordination: Secondary | ICD-10-CM

## 2012-06-24 DIAGNOSIS — M069 Rheumatoid arthritis, unspecified: Secondary | ICD-10-CM

## 2012-06-24 DIAGNOSIS — F05 Delirium due to known physiological condition: Secondary | ICD-10-CM

## 2012-06-24 DIAGNOSIS — R42 Dizziness and giddiness: Secondary | ICD-10-CM

## 2012-06-24 DIAGNOSIS — R270 Ataxia, unspecified: Secondary | ICD-10-CM

## 2012-06-24 MED ORDER — HYDROMORPHONE HCL 2 MG PO TABS: 2.0000 mg | ORAL_TABLET | ORAL | Status: DC | PRN

## 2012-06-24 MED ORDER — OXYCODONE HCL 20 MG PO TB12: 20.0000 mg | ORAL_TABLET | Freq: Two times a day (BID) | ORAL | Status: DC

## 2012-06-24 MED ORDER — VITAMIN D 1000 UNITS PO TABS: 1000.0000 [IU] | ORAL_TABLET | Freq: Every day | ORAL | Status: DC

## 2012-06-24 MED ORDER — PREDNISONE 10 MG PO TABS: ORAL_TABLET | ORAL | Status: DC

## 2012-06-24 MED ORDER — MECLIZINE HCL 12.5 MG PO TABS: 12.5000 mg | ORAL_TABLET | Freq: Three times a day (TID) | ORAL | Status: DC | PRN

## 2012-06-24 NOTE — Assessment & Plan Note (Addendum)
One episode of ? etiol 3/14 - r/o TIA/CVA and other MRI brain ASA

## 2012-06-24 NOTE — Telephone Encounter (Signed)
Pt had already spoke w/ Dr Crissie Sickles RN at time of call back.

## 2012-06-24 NOTE — Assessment & Plan Note (Signed)
MRI brain

## 2012-06-24 NOTE — Patient Instructions (Addendum)
Benign Positional Vertigo symptoms on the left. Start Meclizine. Start Brandt - Daroff exercise several times a day as dirrected. 

## 2012-06-24 NOTE — Assessment & Plan Note (Addendum)
3/14 new - poss BPV vs other MRI ASA 325 qd Dr Linda Hedges in 5 d Benign Positional Vertigo symptoms on the left. Start Meclizine. Start Laruth Bouchard - Daroff exercise several times a day as dirrected.

## 2012-06-24 NOTE — Assessment & Plan Note (Signed)
On Prednisone

## 2012-06-24 NOTE — Progress Notes (Signed)
  Subjective:    Patient ID: [name], male    DOB: 02/05/1946, 67 y.o.   MRN: [id number]  HPI  C/o dizziness x 10 d sitting and walking.  He was disoriented on Mon a wk ago in [place] He saw Dr Ronnald Ramp, had labs and a head CT He needs to fly to [facility] on Sat next wk  Review of Systems  Constitutional: Negative for appetite change, fatigue and unexpected weight change.  HENT: Negative for nosebleeds, congestion, sore throat, sneezing, trouble swallowing and neck pain.   Eyes: Negative for itching and visual disturbance.  Respiratory: Negative for cough.   Cardiovascular: Negative for chest pain, palpitations and leg swelling.  Gastrointestinal: Negative for nausea, diarrhea, blood in stool and abdominal distention.  Genitourinary: Negative for frequency and hematuria.  Musculoskeletal: Positive for arthralgias and gait problem. Negative for back pain and joint swelling.  Skin: Negative for rash.  Neurological: Positive for dizziness. Negative for tremors, speech difficulty, weakness and headaches.  Psychiatric/Behavioral: Negative for sleep disturbance, dysphoric mood and agitation. The patient is not nervous/anxious and is not hyperactive.        Objective:   Physical Exam  Constitutional: He is oriented to person, place, and time. He appears well-developed and well-nourished. No distress.  HENT:  Mouth/Throat: Oropharynx is clear and moist.  Eyes: Conjunctivae are normal. Pupils are equal, round, and reactive to light.  Neck: Normal range of motion. No JVD present. No thyromegaly present.  Cardiovascular: Normal rate, regular rhythm, normal heart sounds and intact distal pulses.  Exam reveals no gallop and no friction rub.   No murmur heard. Pulmonary/Chest: Effort normal and breath sounds normal. No respiratory distress. He has no wheezes. He has no rales. He exhibits no tenderness.  Abdominal: Soft. Bowel sounds are normal. He exhibits no distension and no mass.  There is no tenderness. There is no rebound and no guarding.  Musculoskeletal: Normal range of motion. He exhibits no edema and no tenderness.  Lymphadenopathy:    He has no cervical adenopathy.  Neurological: He is alert and oriented to person, place, and time. He has normal reflexes. No cranial nerve deficit. He exhibits normal muscle tone. Coordination abnormal.  Ataxic Romberg (+)  Skin: Skin is warm and dry. No rash noted.  Psychiatric: He has a normal mood and affect. His behavior is normal. Judgment and thought content normal.  H-P is pos on L Lab Results  Component Value Date   WBC 5.3 06/22/2012   HGB 14.4 06/22/2012   HCT 42.0 06/22/2012   PLT 230.0 06/22/2012   GLUCOSE 120* 06/22/2012   CHOL 100 10/15/2010   TRIG 75.0 10/15/2010   HDL 36.70* 10/15/2010   LDLCALC 48 10/15/2010   ALT 23 06/22/2012   AST 20 06/22/2012   NA 131* 06/22/2012   K 3.6 06/22/2012   CL 92* 06/22/2012   CREATININE 0.9 06/22/2012   BUN 13 06/22/2012   CO2 31 06/22/2012   TSH 0.46 10/15/2010   PSA 1.95 10/15/2010   INR 1.13 03/04/2012   HGBA1C 5.5 08/07/2011          Assessment & Plan:

## 2012-06-24 NOTE — Telephone Encounter (Signed)
Pt informed, Rx printed and placed on side A counter for MD signature

## 2012-06-24 NOTE — Telephone Encounter (Signed)
Does not require any medication for chloride or sodium. May want to watch out for too much in the way of fluids.   May refill Rx for controlled substances

## 2012-06-24 NOTE — Telephone Encounter (Signed)
Caller: [facility]/Patient; Phone: [phone]; Reason for Call: Patient states he viewed his results brain scan and lab work via My Chart.  Pt states labs show he needs chloride and potassium.  Pt would like to know if [facility] will [facility] [place] or will he need to be seen.  Pt reports he is going out of the country next week and will need to get Rx or appt ASAP.  Also pt states he will need Rx for his Oxycontin 20 mg and Hydromorphone 2 mg and would need to pick them up on 3/28 or at the latest 3/29.  PLEASE F/U TO ADVISE PT, THANK YOU.

## 2012-06-25 NOTE — Addendum Note (Signed)
Addended by: Estell Harpin T on: 06/25/2012 01:20 PM   Modules accepted: Orders

## 2012-06-30 ENCOUNTER — Ambulatory Visit
Admission: RE | Admit: 2012-06-30 | Discharge: 2012-06-30 | Disposition: A | Discharge status: Home / Self Care | Payer: Medicare Other | Source: Ambulatory Visit | Attending: Internal Medicine | Admitting: Internal Medicine

## 2012-06-30 ENCOUNTER — Other Ambulatory Visit: Payer: Self-pay

## 2012-06-30 DIAGNOSIS — F05 Delirium due to known physiological condition: Secondary | ICD-10-CM

## 2012-06-30 DIAGNOSIS — R270 Ataxia, unspecified: Secondary | ICD-10-CM

## 2012-06-30 DIAGNOSIS — M069 Rheumatoid arthritis, unspecified: Secondary | ICD-10-CM

## 2012-06-30 DIAGNOSIS — R42 Dizziness and giddiness: Secondary | ICD-10-CM

## 2012-06-30 MED ORDER — GADOBENATE DIMEGLUMINE 529 MG/ML IV SOLN
15.0000 mL | Freq: Once | INTRAVENOUS | Status: AC | PRN
  Administered 2012-06-30: 15 mL via INTRAVENOUS

## 2012-06-30 MED ORDER — COLCHICINE 0.6 MG PO TABS: 0.6000 mg | ORAL_TABLET | Freq: Two times a day (BID) | ORAL | Status: DC

## 2012-07-01 ENCOUNTER — Ambulatory Visit (INDEPENDENT_AMBULATORY_CARE_PROVIDER_SITE_OTHER): Payer: Medicare Other | Admitting: Internal Medicine

## 2012-07-01 ENCOUNTER — Encounter: Payer: Self-pay | Admitting: Internal Medicine

## 2012-07-01 VITALS — BP 118/80 | HR 89 | Temp 97.3°F | Resp 12 | Ht 66.5 in | Wt 152.8 lb

## 2012-07-01 DIAGNOSIS — R42 Dizziness and giddiness: Secondary | ICD-10-CM

## 2012-07-01 MED ORDER — SCOPOLAMINE 1 MG/3DAYS TD PT72: 1.0000 | MEDICATED_PATCH | TRANSDERMAL | Status: DC
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
## 2012-07-01 NOTE — Assessment & Plan Note (Addendum)
pt appears to have severe dysequilibrium, mild horizontal nystagmus and ataxia.   CT and MRI have been negative. Most likely diagnosis is labyrinthitis but other underlying CNS disease needs to be ruled out.   Plan: - referral to neurologist - transdermal scopolamine patch

## 2012-07-02 ENCOUNTER — Encounter: Payer: Self-pay | Admitting: Cardiovascular Disease

## 2012-07-02 ENCOUNTER — Ambulatory Visit (INDEPENDENT_AMBULATORY_CARE_PROVIDER_SITE_OTHER): Payer: Medicare Other | Admitting: Cardiovascular Disease

## 2012-07-02 VITALS — BP 131/84 | HR 68 | Wt 152.0 lb

## 2012-07-02 DIAGNOSIS — R42 Dizziness and giddiness: Secondary | ICD-10-CM

## 2012-07-02 DIAGNOSIS — I4891 Unspecified atrial fibrillation: Secondary | ICD-10-CM

## 2012-07-02 DIAGNOSIS — I493 Ventricular premature depolarization: Secondary | ICD-10-CM

## 2012-07-02 DIAGNOSIS — I10 Essential (primary) hypertension: Secondary | ICD-10-CM

## 2012-07-02 DIAGNOSIS — I2510 Atherosclerotic heart disease of native coronary artery without angina pectoris: Secondary | ICD-10-CM

## 2012-07-02 DIAGNOSIS — E785 Hyperlipidemia, unspecified: Secondary | ICD-10-CM

## 2012-07-02 DIAGNOSIS — I4949 Other premature depolarization: Secondary | ICD-10-CM

## 2012-07-02 DIAGNOSIS — Z862 Personal history of diseases of the blood and blood-forming organs and certain disorders involving the immune mechanism: Secondary | ICD-10-CM

## 2012-07-02 NOTE — Assessment & Plan Note (Signed)
Stable with no angina and good activity level.  Continue medical Rx Continue baby aspirin

## 2012-07-02 NOTE — Assessment & Plan Note (Signed)
Bening asymptomatic EF has been normal Observe

## 2012-07-02 NOTE — Assessment & Plan Note (Signed)
Cholesterol is at goal.  Continue current dose of statin and diet Rx.  No myalgias or side effects.  F/U  LFT's in 6 months. Lab Results  Component Value Date   LDLCALC 48 10/15/2010

## 2012-07-02 NOTE — Patient Instructions (Addendum)
Your physician wants you to follow-up in: YEAR WITH DR NISHAN  You will receive a reminder letter in the mail two months in advance. If you don't receive a letter, please call our office to schedule the follow-up appointment.  Your physician recommends that you continue on your current medications as directed. Please refer to the Current Medication list given to you today. 

## 2012-07-02 NOTE — Assessment & Plan Note (Signed)
Resolved maint NSR with no palpitations

## 2012-07-02 NOTE — Assessment & Plan Note (Signed)
Ferritin stable with no recent phlebotomy  Arthritis per ortho
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
## 2012-07-02 NOTE — Assessment & Plan Note (Signed)
Well controlled.  Continue current medications and low sodium Dash type diet.    

## 2012-07-02 NOTE — Assessment & Plan Note (Signed)
Improved with scopolamine patch MRI normal

## 2012-07-07 HISTORY — PX: HAMMER TOE SURGERY: SHX385

## 2012-07-13 ENCOUNTER — Ambulatory Visit: Payer: Medicare Other | Admitting: Neurology

## 2012-07-14 ENCOUNTER — Inpatient Hospital Stay (HOSPITAL_COMMUNITY): Payer: Medicare Other

## 2012-07-14 ENCOUNTER — Encounter (HOSPITAL_COMMUNITY): Payer: Self-pay | Admitting: Internal Medicine

## 2012-07-14 ENCOUNTER — Encounter: Payer: Self-pay | Admitting: Neurology

## 2012-07-14 ENCOUNTER — Ambulatory Visit (INDEPENDENT_AMBULATORY_CARE_PROVIDER_SITE_OTHER): Payer: Medicare Other | Admitting: Neurology

## 2012-07-14 ENCOUNTER — Inpatient Hospital Stay (HOSPITAL_COMMUNITY)
Admission: AD | Admit: 2012-07-14 | Discharge: 2012-07-16 | DRG: 091 | Disposition: A | Discharge status: Home / Self Care | Payer: Medicare Other | Source: Ambulatory Visit | Attending: Internal Medicine | Admitting: Internal Medicine

## 2012-07-14 VITALS — BP 129/78 | HR 71 | Temp 97.6°F | Ht 65.0 in | Wt 154.0 lb

## 2012-07-14 DIAGNOSIS — M159 Polyosteoarthritis, unspecified: Secondary | ICD-10-CM

## 2012-07-14 DIAGNOSIS — R270 Ataxia, unspecified: Secondary | ICD-10-CM

## 2012-07-14 DIAGNOSIS — Z981 Arthrodesis status: Secondary | ICD-10-CM

## 2012-07-14 DIAGNOSIS — M5137 Other intervertebral disc degeneration, lumbosacral region: Secondary | ICD-10-CM

## 2012-07-14 DIAGNOSIS — Z9181 History of falling: Secondary | ICD-10-CM

## 2012-07-14 DIAGNOSIS — R279 Unspecified lack of coordination: Secondary | ICD-10-CM

## 2012-07-14 DIAGNOSIS — I4891 Unspecified atrial fibrillation: Secondary | ICD-10-CM

## 2012-07-14 DIAGNOSIS — J4489 Other specified chronic obstructive pulmonary disease: Secondary | ICD-10-CM | POA: Diagnosis present

## 2012-07-14 DIAGNOSIS — Z7982 Long term (current) use of aspirin: Secondary | ICD-10-CM

## 2012-07-14 DIAGNOSIS — I493 Ventricular premature depolarization: Secondary | ICD-10-CM

## 2012-07-14 DIAGNOSIS — J9819 Other pulmonary collapse: Secondary | ICD-10-CM | POA: Diagnosis present

## 2012-07-14 DIAGNOSIS — F3289 Other specified depressive episodes: Secondary | ICD-10-CM | POA: Diagnosis present

## 2012-07-14 DIAGNOSIS — E785 Hyperlipidemia, unspecified: Secondary | ICD-10-CM

## 2012-07-14 DIAGNOSIS — F329 Major depressive disorder, single episode, unspecified: Secondary | ICD-10-CM | POA: Diagnosis present

## 2012-07-14 DIAGNOSIS — G9340 Encephalopathy, unspecified: Secondary | ICD-10-CM | POA: Diagnosis present

## 2012-07-14 DIAGNOSIS — IMO0002 Reserved for concepts with insufficient information to code with codable children: Secondary | ICD-10-CM

## 2012-07-14 DIAGNOSIS — Z96619 Presence of unspecified artificial shoulder joint: Secondary | ICD-10-CM

## 2012-07-14 DIAGNOSIS — J449 Chronic obstructive pulmonary disease, unspecified: Secondary | ICD-10-CM | POA: Diagnosis present

## 2012-07-14 DIAGNOSIS — Z9861 Coronary angioplasty status: Secondary | ICD-10-CM

## 2012-07-14 DIAGNOSIS — F05 Delirium due to known physiological condition: Secondary | ICD-10-CM | POA: Diagnosis present

## 2012-07-14 DIAGNOSIS — T443X5A Adverse effect of other parasympatholytics [anticholinergics and antimuscarinics] and spasmolytics, initial encounter: Secondary | ICD-10-CM | POA: Diagnosis present

## 2012-07-14 DIAGNOSIS — R4182 Altered mental status, unspecified: Secondary | ICD-10-CM

## 2012-07-14 DIAGNOSIS — J309 Allergic rhinitis, unspecified: Secondary | ICD-10-CM | POA: Diagnosis present

## 2012-07-14 DIAGNOSIS — G894 Chronic pain syndrome: Secondary | ICD-10-CM | POA: Diagnosis present

## 2012-07-14 DIAGNOSIS — G8929 Other chronic pain: Secondary | ICD-10-CM

## 2012-07-14 DIAGNOSIS — I7389 Other specified peripheral vascular diseases: Secondary | ICD-10-CM | POA: Diagnosis present

## 2012-07-14 DIAGNOSIS — M069 Rheumatoid arthritis, unspecified: Secondary | ICD-10-CM

## 2012-07-14 DIAGNOSIS — T450X5A Adverse effect of antiallergic and antiemetic drugs, initial encounter: Secondary | ICD-10-CM | POA: Diagnosis present

## 2012-07-14 DIAGNOSIS — E876 Hypokalemia: Secondary | ICD-10-CM | POA: Diagnosis present

## 2012-07-14 DIAGNOSIS — I2510 Atherosclerotic heart disease of native coronary artery without angina pectoris: Secondary | ICD-10-CM | POA: Diagnosis present

## 2012-07-14 DIAGNOSIS — Z96659 Presence of unspecified artificial knee joint: Secondary | ICD-10-CM

## 2012-07-14 DIAGNOSIS — R269 Unspecified abnormalities of gait and mobility: Secondary | ICD-10-CM | POA: Diagnosis present

## 2012-07-14 DIAGNOSIS — Z79899 Other long term (current) drug therapy: Secondary | ICD-10-CM

## 2012-07-14 DIAGNOSIS — R42 Dizziness and giddiness: Secondary | ICD-10-CM

## 2012-07-14 DIAGNOSIS — F419 Anxiety disorder, unspecified: Secondary | ICD-10-CM | POA: Diagnosis present

## 2012-07-14 DIAGNOSIS — I10 Essential (primary) hypertension: Secondary | ICD-10-CM | POA: Diagnosis present

## 2012-07-14 DIAGNOSIS — E871 Hypo-osmolality and hyponatremia: Secondary | ICD-10-CM | POA: Diagnosis present

## 2012-07-14 DIAGNOSIS — F411 Generalized anxiety disorder: Secondary | ICD-10-CM | POA: Diagnosis present

## 2012-07-14 LAB — RAPID URINE DRUG SCREEN, HOSP PERFORMED
Amphetamines: NOT DETECTED
Barbiturates: NOT DETECTED
Benzodiazepines: NOT DETECTED
Cocaine: NOT DETECTED
Opiates: NOT DETECTED
Tetrahydrocannabinol: NOT DETECTED

## 2012-07-14 LAB — COMPREHENSIVE METABOLIC PANEL
ALT: 25 U/L (ref 0–53)
AST: 21 U/L (ref 0–37)
Albumin: 3.7 g/dL (ref 3.5–5.2)
Alkaline Phosphatase: 69 U/L (ref 39–117)
BUN: 14 mg/dL (ref 6–23)
CO2: 37 mEq/L — ABNORMAL HIGH (ref 19–32)
Calcium: 9.5 mg/dL (ref 8.4–10.5)
Chloride: 89 mEq/L — ABNORMAL LOW (ref 96–112)
Creatinine, Ser: 0.86 mg/dL (ref 0.50–1.35)
GFR calc Af Amer: >90 mL/min (ref >90)
GFR calc non Af Amer: 89 mL/min — ABNORMAL LOW (ref >90)
Glucose, Bld: 84 mg/dL (ref 70–99)
Potassium: 3.3 mEq/L — ABNORMAL LOW (ref 3.5–5.1)
Sodium: 134 mEq/L — ABNORMAL LOW (ref 135–145)
Total Bilirubin: 0.7 mg/dL (ref 0.3–1.2)
Total Protein: 6.8 g/dL (ref 6.0–8.3)

## 2012-07-14 LAB — URINALYSIS, ROUTINE W REFLEX MICROSCOPIC
Bilirubin Urine: NEGATIVE
Glucose, UA: NEGATIVE mg/dL
Hgb urine dipstick: NEGATIVE
Ketones, ur: NEGATIVE mg/dL
Leukocytes, UA: NEGATIVE
Nitrite: NEGATIVE
Protein, ur: NEGATIVE mg/dL
Specific Gravity, Urine: 1.007 (ref 1.005–1.030)
Urobilinogen, UA: 0.2 mg/dL (ref 0.0–1.0)
pH: 7 (ref 5.0–8.0)

## 2012-07-14 LAB — CBC
HCT: 40.5 % (ref 39.0–52.0)
Hemoglobin: 14.3 g/dL (ref 13.0–17.0)
MCH: 30.8 pg (ref 26.0–34.0)
MCHC: 35.3 g/dL (ref 30.0–36.0)
MCV: 87.1 fL (ref 78.0–100.0)
Platelets: 176 10*3/uL (ref 150–400)
RBC: 4.65 MIL/uL (ref 4.22–5.81)
RDW: 14.1 % (ref 11.5–15.5)
WBC: 6.2 10*3/uL (ref 4.0–10.5)

## 2012-07-14 LAB — AMMONIA: Ammonia: 22 umol/L (ref 11–60)

## 2012-07-14 LAB — ETHANOL: Alcohol, Ethyl (B): <11 mg/dL (ref 0–11)

## 2012-07-14 MED ORDER — MECLIZINE HCL 12.5 MG PO TABS
12.5000 mg | ORAL_TABLET | Freq: Three times a day (TID) | ORAL | Status: DC | PRN
  Filled 2012-07-14: qty 2

## 2012-07-14 MED ORDER — ONDANSETRON HCL 4 MG PO TABS
4.0000 mg | ORAL_TABLET | Freq: Four times a day (QID) | ORAL | Status: DC | PRN
  Administered 2012-07-15 – 2012-07-16 (×2): 4 mg via ORAL
  Filled 2012-07-14 (×3): qty 1

## 2012-07-14 MED ORDER — ACETAMINOPHEN 325 MG PO TABS: 650.0000 mg | ORAL_TABLET | Freq: Four times a day (QID) | ORAL | Status: DC | PRN

## 2012-07-14 MED ORDER — ACETAMINOPHEN 500 MG PO TABS: 1000.0000 mg | ORAL_TABLET | ORAL | Status: DC

## 2012-07-14 MED ORDER — ALPRAZOLAM 0.5 MG PO TABS
0.5000 mg | ORAL_TABLET | Freq: Three times a day (TID) | ORAL | Status: DC | PRN
  Administered 2012-07-16: 0.5 mg via ORAL
  Filled 2012-07-14: qty 1

## 2012-07-14 MED ORDER — SODIUM CHLORIDE 0.9 % IV SOLN
INTRAVENOUS | Status: DC
  Administered 2012-07-14 – 2012-07-15 (×3): via INTRAVENOUS

## 2012-07-14 MED ORDER — ACETAMINOPHEN 650 MG RE SUPP: 650.0000 mg | Freq: Four times a day (QID) | RECTAL | Status: DC | PRN

## 2012-07-14 MED ORDER — PREDNISONE 5 MG PO TABS
5.0000 mg | ORAL_TABLET | Freq: Every day | ORAL | Status: DC
  Administered 2012-07-15 – 2012-07-16 (×2): 5 mg via ORAL
  Filled 2012-07-14 (×4): qty 1

## 2012-07-14 MED ORDER — ONDANSETRON HCL 4 MG/2ML IJ SOLN
4.0000 mg | Freq: Four times a day (QID) | INTRAMUSCULAR | Status: DC | PRN
  Administered 2012-07-16: 4 mg via INTRAVENOUS
  Filled 2012-07-14: qty 2

## 2012-07-14 MED ORDER — GABAPENTIN 300 MG PO CAPS
300.0000 mg | ORAL_CAPSULE | Freq: Three times a day (TID) | ORAL | Status: DC
  Administered 2012-07-14 – 2012-07-16 (×6): 300 mg via ORAL
  Filled 2012-07-14 (×8): qty 1

## 2012-07-14 MED ORDER — LORATADINE 10 MG PO TABS: 10.0000 mg | ORAL_TABLET | Freq: Once | ORAL | Status: DC

## 2012-07-14 MED ORDER — TIOTROPIUM BROMIDE MONOHYDRATE 18 MCG IN CAPS
18.0000 ug | ORAL_CAPSULE | Freq: Every day | RESPIRATORY_TRACT | Status: DC
  Administered 2012-07-15 – 2012-07-16 (×2): 18 ug via RESPIRATORY_TRACT
  Filled 2012-07-14 (×2): qty 5

## 2012-07-14 MED ORDER — FLUOXETINE HCL 20 MG PO CAPS
80.0000 mg | ORAL_CAPSULE | Freq: Every day | ORAL | Status: DC
  Administered 2012-07-15 – 2012-07-16 (×2): 80 mg via ORAL
  Filled 2012-07-14 (×2): qty 4

## 2012-07-14 MED ORDER — VITAMIN D3 25 MCG (1000 UNIT) PO TABS
1000.0000 [IU] | ORAL_TABLET | Freq: Every day | ORAL | Status: DC
  Administered 2012-07-15 – 2012-07-16 (×2): 1000 [IU] via ORAL
  Filled 2012-07-14 (×2): qty 1

## 2012-07-14 MED ORDER — FLUOXETINE HCL 40 MG PO CAPS: 80.0000 mg | ORAL_CAPSULE | Freq: Every day | ORAL | Status: DC

## 2012-07-14 MED ORDER — TRAZODONE HCL 100 MG PO TABS
100.0000 mg | ORAL_TABLET | Freq: Every day | ORAL | Status: DC
  Administered 2012-07-14 – 2012-07-15 (×2): 100 mg via ORAL
  Filled 2012-07-14 (×3): qty 1

## 2012-07-14 MED ORDER — POTASSIUM CHLORIDE CRYS ER 20 MEQ PO TBCR
40.0000 meq | EXTENDED_RELEASE_TABLET | Freq: Two times a day (BID) | ORAL | Status: AC
  Administered 2012-07-14 – 2012-07-15 (×2): 40 meq via ORAL
  Filled 2012-07-14 (×2): qty 2

## 2012-07-14 MED ORDER — SODIUM CHLORIDE 0.9 % IV SOLN: 3.0000 mg/kg | Freq: Once | INTRAVENOUS | Status: DC

## 2012-07-14 MED ORDER — LORATADINE 10 MG PO TABS: 10.0000 mg | ORAL_TABLET | ORAL | Status: DC

## 2012-07-14 MED ORDER — ACETAMINOPHEN 500 MG PO TABS: 1000.0000 mg | ORAL_TABLET | Freq: Once | ORAL | Status: DC

## 2012-07-14 MED ORDER — SODIUM CHLORIDE 0.9 % IV SOLN: Freq: Every day | INTRAVENOUS | Status: DC

## 2012-07-14 MED ORDER — ACLIDINIUM BROMIDE 400 MCG/ACT IN AEPB: 1.0000 | INHALATION_SPRAY | Freq: Two times a day (BID) | RESPIRATORY_TRACT | Status: DC

## 2012-07-14 MED ORDER — SODIUM CHLORIDE 0.9 % IJ SOLN
3.0000 mL | Freq: Two times a day (BID) | INTRAMUSCULAR | Status: DC
  Administered 2012-07-16: 3 mL via INTRAVENOUS

## 2012-07-14 MED ORDER — MELATONIN 5 MG PO TABS: 5.0000 mg | ORAL_TABLET | Freq: Every day | ORAL | Status: DC

## 2012-07-14 MED ORDER — HYDROMORPHONE HCL 2 MG PO TABS
2.0000 mg | ORAL_TABLET | ORAL | Status: DC | PRN
  Administered 2012-07-14 – 2012-07-15 (×3): 2 mg via ORAL
  Filled 2012-07-14 (×4): qty 1

## 2012-07-14 MED ORDER — SODIUM CHLORIDE 0.9 % IV SOLN: 3.0000 mg/kg | INTRAVENOUS | Status: DC

## 2012-07-14 NOTE — Progress Notes (Signed)
Subjective:    Patient ID: [name] is a 67 y.o. male.  HPI Star Age, MD, PhD [facility] [address], [address] [place], [place] [address]  Dear Dr. Ronnald Ramp,  I saw your patient, [name], upon your kind request in my neurologic clinic today for initial consultation of his tremor and ataxia, vertigo and blurry vision. The patient is accompanied by his wife today. As you know, [name] is a very pleasant 67 year old right-handed gentleman with an underlying medical history of RA, OA, COPD, atrial fibrillation, anemia, hypertension, heart disease and PVCs, reports new onset unsteady gait, vertigo, dizziness and blurry vision since approximately 06/15/2012. Sx started fairly abruptly and was not preceded by any illness or fevers or chills. He was out of town with his wife and stumbled in the [place] room. When he came back, he saw you 06/22/12. He felt weak all over and did not describe any slurring of speech at the time, but his wife noted slurring. No droopy face, lightheadedness numbness or headache or loss of consciousness or convulsions. He denied any chest pain, shortness of breath, or vomiting. He had difficulty walking but described no muscle aches or back pain or joint pain. He had a CT of the head on 06/22/2012 which showed no acute abnormality. 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 The patient's current medications are: Hydrochlorothiazide, losartan, meclizine as needed, melatonin, OxyContin, Protonix, MiraLax, potassium, prednisone, scopolamine patch, Zocor, trazodone. In the past 2 days she has noted confusion and disorientation, and perhaps staring, but no lip smacking or automatism. He is taking percocet and oxycontin for pain, particularly R foot pain.  Meclizine helped a little with the  nausea and also the scopolamine patch helped with the nausea component. His mouth has been very dry and that has affected his speech. Overall his symptoms have progressed and the most concerning finding per wife is the fact that he is confused and may hold a whole conversation that would be out of context. This was not the case in the beginning and she has noticed this confusion in the past 24-48 hours. He has been sleepy and has slept a lot for the past 48 h.   His Past Medical History Is Significant For: Past Medical History  Diagnosis Date  . Hx of colonic polyps   . Diverticulosis   . Hypertension   . Osteoarthritis   . 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 CAD (coronary artery disease)     minimal coronary plaque in the LAD and right coronary system. 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 7-06. Last nuclear stress 11-17-06 fixed anterior/ inferior defect, no inducible ischemia, EF 81%  . Allergic rhinitis   . Hx of colonoscopy   . RA (rheumatoid arthritis)   . COPD (chronic obstructive pulmonary disease)   . PONV (postoperative nausea and vomiting)   . Dysrhythmia 01-24-12    past hx. A.Fib x1 episode-responded to med.    His Past Surgical History Is Significant For: Past Surgical History  Procedure Laterality Date  . Appendectomy    . Inguinal hernia repair  2008    Right, left remotely  . Total knee arthroplasty  right knee partial 2002  . Tonsillectomy    . Shoulder preplacement  2008    left, partial  . Ptca w/ coated stent lad      Cx secondary to tear  . Foot surgery  11-08    for removal of bone spurs- right foot  . Reconstruction of ankle  6-09    Right- [facility]  . L4-5 diskectomy w/ fusion, cage placement and rods  12-10    [place]  . Partial knee arthroplasty      left  . Total shoulder replacement      right  . Joint replacement    . Cardiac  catheterization  01-24-12    coronary stents- no problems  . Cataract extraction, bilateral  01-24-12    Bilateral  . Orif shoulder fracture  02/06/2012    Procedure: OPEN REDUCTION INTERNAL FIXATION (ORIF) SHOULDER FRACTURE;  Surgeon: Nita Sells, MD;  Location: [facility];  Service: Orthopedics;  Laterality: Left;  ORIF of a Left Shoulder Fracture with  Iliac Crest Bone Graft aspiration   . Harvest bone graft  02/06/2012    Procedure: HARVEST ILIAC BONE GRAFT;  Surgeon: Nita Sells, MD;  Location: [facility];  Service: Orthopedics;;  bone marrow aspirqation   . Hardware removal  03/09/2012    Procedure: HARDWARE REMOVAL;  Surgeon: Nita Sells, MD;  Location: [facility];  Service: Orthopedics;  Laterality: Left;  Hardware Removal from Left Shoulder    His Family History Is Significant For: Family History  Problem Relation Age of Onset  . Uterine cancer Mother     survivor  . Macular degeneration Mother   . Other Mother     ankle edema  . Lung cancer Mother   . Coronary artery disease Father   . Hypertension Father   . Prostate cancer Father   . Colon polyps Father   . Thyroid disease Sister   . Heart attack Brother   . Hyperlipidemia Brother   . Other Brother     Schizophrenic  . Diabetes Neg Hx   . Colon cancer Neg Hx   . Esophageal cancer Neg Hx   . Rectal cancer Neg Hx   . Stomach cancer Neg Hx   . Coronary artery disease Maternal Aunt   . Heart attack Maternal Aunt   . Rheum arthritis Sister   . Hemochromatosis Sister     His Social History Is Significant For: History   Social History  . Marital Status: Married    Spouse Name: N/A    Number of Children: 2  . Years of Education: 16   Occupational History  . 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 Smoking status: Never Smoker   . Smokeless tobacco: Never Used  . Alcohol Use: No  . Drug Use: No  . Sexually Active: Yes -- Male partner(s)    Other Topics Concern  . None   Social History Narrative   [employer] - BA. Married 1972-55yr seperated/divorced. Engaged April '11. 1 son '74; step-daughter '70. 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 During '11 discovered an [employer]- $500,000+ loss. Is handling this OK - will keep the business running.    His Allergies Are:  Allergies  Allergen Reactions  . Penicillins Hives, Shortness Of Breath and Rash    REACTION: hives, breathing problems  .  Morphine And Related Nausea And Vomiting and Swelling  :   His Current Medications Are:  Outpatient Encounter Prescriptions as of 07/14/2012  Medication Sig Dispense Refill  . Aclidinium Bromide (TUDORZA PRESSAIR) 400 MCG/ACT AEPB Inhale 1 puff into the lungs 2 (two) times daily.  1 each  11  . adalimumab (HUMIRA) 40 MG/0.8ML injection Inject 40 mg into the skin every 14 (fourteen) days.      . ALPRAZolam (XANAX XR) 1 MG 24 hr tablet Take 1 tablet (1 mg total) by mouth every morning.  30 tablet  5  . ALPRAZolam (XANAX) 0.5 MG tablet Take 1 tablet (0.5 mg total) by mouth 3 (three) times daily as needed for sleep or anxiety.  90 tablet  5  . aspirin (ASPIRIN EC) 81 MG EC tablet Take 81 mg by mouth every morning.       . CELEBREX 200 MG capsule TAKE 1 CAPSULE (200 MG TOTAL) BY MOUTH 2 (TWO) TIMES DAILY.  60 capsule  5  . cholecalciferol (VITAMIN D) 1000 UNITS tablet Take 1 tablet (1,000 Units total) by mouth daily.  100 tablet  3  . colchicine 0.6 MG tablet Take 1 tablet (0.6 mg total) by mouth 2 (two) times daily.  60 tablet  0  . FLUoxetine (PROZAC) 40 MG capsule Take 2 capsules (80 mg total) by mouth daily.  90 capsule  3  . fluticasone (FLONASE) 50 MCG/ACT nasal spray PLACE 2 SPRAYS INTO THE NOSE DAILY.  16 g  1  . furosemide (LASIX) 20 MG tablet TAKE 1 TABLET (20 MG TOTAL) BY MOUTH EVERY MORNING.  30 tablet  5  .  gabapentin (NEURONTIN) 300 MG capsule Take 300 mg by mouth 3 (three) times daily. 2 every am, 1 at noon, 2 at bedtime, 1 at 1:00 am.      . hydrochlorothiazide (HYDRODIURIL) 25 MG tablet TAKE 1 TABLET (25 MG TOTAL) BY MOUTH DAILY.  30 tablet  7  . HYDROmorphone (DILAUDID) 2 MG tablet Take 1 tablet (2 mg total) by mouth every 4 (four) hours as needed for pain. May take up to 2 tablets if needed. Fill on or after 07/03/12.  60 tablet  0  . losartan (COZAAR) 50 MG tablet TAKE 1 TABLET BY MOUTH ONCE A DAY  90 tablet  2  . meclizine (ANTIVERT) 12.5 MG tablet Take 1-2 tablets (12.5-25 mg total) by mouth 3 (three) times daily as needed.  60 tablet  2  . Melatonin ([employer]S MELATONIN) 5 MG TABS Take 5 mg by mouth daily.        [name] oxyCODONE (OXYCONTIN) 20 MG 12 hr tablet Take 1 tablet (20 mg total) by mouth every 12 (twelve) hours. FILL on or after 04/06/12.  60 tablet  0  . oxyCODONE (OXYCONTIN) 20 MG 12 hr tablet Take 1 tablet (20 mg total) by mouth every 12 (twelve) hours. FILL on or after 09/01/12.  60 tablet  0  . pantoprazole (PROTONIX) 40 MG tablet Take 1 tablet (40 mg total) by mouth 2 (two) times daily. Take 1 tablet twice a day.  60 tablet  3  . Polyethyl Glycol-Propyl Glycol (SYSTANE ULTRA) 0.4-0.3 % SOLN Apply 1 drop to eye daily.      . potassium chloride SA (K-DUR,KLOR-CON) 20 MEQ tablet Take 1 tablet (20 mEq total) by mouth 2 (two) times daily.  60 tablet  3  . predniSONE (DELTASONE) 10 MG tablet Prednisone 10 mg: take 4 tabs a day x 3 days; then 3  tabs a day x 4 days; then 2 tabs a day x 4 days, then 1 tab a day x 6 days, then go back to 5 mg/d. Take pc.  38 tablet  0  . scopolamine (TRANSDERM-SCOP) 1.5 MG Place 1 patch (1.5 mg total) onto the skin every 3 (three) days.  10 patch  12  . simvastatin (ZOCOR) 40 MG tablet Take 40 mg by mouth every 3 (three) days. Every 2-3 days      . traZODone (DESYREL) 100 MG tablet Take 1 tablet (100 mg total) by mouth at bedtime.  30 tablet  11    Facility-Administered Encounter Medications as of 07/14/2012  Medication Dose Route Frequency Provider Last Rate Last Dose  . loratadine (CLARITIN) tablet 10 mg  10 mg Oral Daily Hennie Duos, MD      :  Review of Systems  Constitutional: Positive for appetite change and fatigue.  Eyes: Positive for visual disturbance (blurred vision).  Respiratory: Positive for shortness of breath.        Snoring  Endocrine: Positive for polydipsia.  Neurological: Positive for dizziness, weakness, numbness (hands) and headaches.  Psychiatric/Behavioral: Positive for confusion and sleep disturbance (sleepiness).    Objective:  Neurologic Exam  Physical Exam Physical Examination:   Filed Vitals:   07/14/12 1342  BP: 129/78  Pulse: 71  Temp:     General Examination: The patient is a very pleasant 67 y.o. male in no acute distress.  HEENT: Normocephalic, atraumatic, pupils are equal, round and reactive to light and accommodation. Funduscopic exam is normal with sharp disc margins noted. Extraocular tracking is fair, but shows mild nystagmus to R gaze. Hearing is grossly intact. Tympanic membranes are clear bilaterally. Face is symmetric with normal facial animation and normal facial sensation. Speech is mildly dysarthric with a mild scanning component. There is no lip, neck or jaw tremor. Neck is supple with full range of motion. There are no carotid bruits on auscultation. Oropharynx exam reveals moderate mouth dryness. No significant airway crowding is noted. Mallampati is class II. Tongue protrudes centrally and palate elevates symmetrically. Tongue movements are fine.   Chest: is clear to auscultation without wheezing, rhonchi or crackles noted.  Heart: sounds are regular and normal without murmurs, rubs or gallops noted.   Abdomen: is soft, non-tender and non-distended with normal bowel sounds appreciated on auscultation.  Extremities: There is no pitting edema in the distal lower  extremities bilaterally. Pedal pulses are intact.  Skin: is warm and dry with no trophic changes noted.  Musculoskeletal: exam reveals no obvious joint deformities, tenderness or joint swelling or erythema.  Neurologically:  Mental status: The patient is awake, paying attention. He is not fully oriented, not able to give the correct date. His memory, attention, language and knowledge are slightly impaired, in that, he is not able to give a reliable and concise Hx and is confused at times. Thought process is tangential. He has word finding difficulties. Speech is mildly dysarthric.   Cranial nerves are as described above under HEENT exam. In addition, shoulder shrug is normal with equal shoulder height noted. Motor exam: Normal bulk, and strength are noted. Tone may be a little decreased throughout. There is no drift, or tremor, but there is bilateral rebound, L > R. Romberg is positive.  Reflexes are 1+ throughout. Fine motor skills are mildly impaired, bilaterally. On finger to nose testing he has mild dysmetria bilaterally. He has no significant intention tremor. On heel-to-shin testing he does okay on  the right but has mild abnormality on the left. There is no truncal ataxia.  Sensory exam is intact to light touch, pinprick, vibration sense.  Gait, station and balance: He stands up with mild difficulty and takes a couple of attempts. His stance is mildly wide-based. His walking is mildly ataxic and he has trouble turning. Arm swing is preserved bilaterally. Posture is mildly stooped to age-appropriate. He's not able to do tandem walk. He is able to pull himself up on his toes and very briefly on his heels.  Assessment and Plan:    Assessment and Plan:  In summary, [name] is a very pleasant 67 y.o.-year old male with a complex medical history, who presents with abrupt onset vertigo some 3 weeks ago with more recent AMS in the past 2 days. On exam, he has cerebellar ataxia and has mild  encephalopathy. Overall, his condition has worsened and I am concerned about the AMS. I believe, he would benefit from admission to [facility] for w/u of his acute cerebellar syndrome and AMS, DDx include, cerebellitis, meningitis/encephalitis, metabolic/toxic. I was able to talk to Dr. Tressie Ellis in neurology at the hospital and also talked to Dr. Tyrell Antonio, hospitalist at the hospital who was kind enough to accept the patient in direct admission to a telemetry bed, team 10. I would recommend workup with lumbar puncture, CSF studies for infectious causes, urine studies with urinalysis and UDS, metabolic workup, CBC, chest x-ray and EEG. We are now in the process of requesting a bed and I will keep in touch with his wife and will be more than happy to see the patient back after his discharge from the hospital. The patient and his wife were in agreement with the plan. Of note, the patient has a history of heavier alcohol use several years ago perhaps 5 or 6 years ago but he stopped drinking altogether 6 months ago.  Thank you very much for allowing me to participate in the care of this nice patient. If I can be of any further assistance to you please do not hesitate to call me at [phone].  Sincerely,   Star Age, MD, PhD

## 2012-07-14 NOTE — Progress Notes (Signed)
Pt arrived to floor via wheelchair with wife, Dr Tyrell Antonio up here to meet patient and called Dr Sharyn Lull. IV attempted, unsuccessful, iv team paged. Pt hooked up to telemetry. Patient passed RN swallow screen with no problem. Call bell in reach and bed alarm set. Oriented to person, place, not date.  [name], [name]

## 2012-07-14 NOTE — Progress Notes (Addendum)
Patient was seen today by Dr Rexene Alberts, with [facility] for Vertigo, unsteady gait,  that started 3 weeks ago. Patient had a negative CT head on the  17, MRI on the 25 that showed old right thalamic stroke. Patient was found to have AMS for last 24 to 48 hour, with increase confusion, dysarthric, more sleepy. Dr Rexene Alberts recommend LP, CSF analysis. Please inform Dr Nicole Kindred when patient arrived to hospital. Patient vitals were stable. Please see note on EPIC perform by Dr Rexene Alberts for further detail.  John Sieling, Md.

## 2012-07-14 NOTE — H&P (Addendum)
[facility] History and Physical  [name] KNL:[id number] DOB: 1946-03-07 DOA: 07/14/2012  Referring physician: Dr Rexene Alberts PCP: Adella Hare, MD  Specialists: Dr Nicole Kindred.   Chief Complaint: Confusion.   HPI: [name] is a 67 y.o. male with PMH significant for hemochromatosis, Hypertension, COPD, RA, multiple surgery, chronic pain syndrome on chronic opioid, who was refer for direct admission by Dr. Rexene Alberts with [place] neurologist for evaluation of AMS and worsening gait. Patient develop gait problem, dizziness 3 weeks ago. He was prescribe scopolamine and meclizine. 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  He was refer for  evaluation to Dr Rexene Alberts, neurologist. He was seen today by Dr Rexene Alberts.  Patient was found to be confuse. Wife relates confusion for last 48 hours. Patient not making sense at time per wife. He had one episode of headache last week. He has not had any fever. He relates blurry vision. He relates dizziness, specially when he turn his head to left side. He denies lightheaded. He has not been able to drive because of dizziness. He return from a trip to [place] 3 days prior to admission. His gait and dizziness has been worse for last couple of days. Per wife patient has been more sleepy for last couple of days.  Patient denies, chest pain, dyspnea, cough, abdominal pain.  Please See Dr Rexene Alberts note. 4-8 Review of Systems: Negative except as per HPI.   Past Medical History  Diagnosis Date  . Hx of colonic polyps   . Diverticulosis   . Hypertension   . Osteoarthritis   . 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 CAD (coronary artery disease)     minimal coronary plaque in the LAD and right coronary system. PCI of a 95% obtuse marginal lesion w/ resultant spiral dissection requiring drug-eluting stent  placement. 7-06. Last nuclear stress 11-17-06 fixed anterior/ inferior defect, no inducible ischemia, EF 81%  . Allergic rhinitis   . Hx of colonoscopy   . RA (rheumatoid arthritis)   . COPD (chronic obstructive pulmonary disease)   . PONV (postoperative nausea and vomiting)   . Dysrhythmia 01-24-12    past hx. A.Fib x1 episode-responded to med.   Past Surgical History  Procedure Laterality Date  . Appendectomy    . Inguinal hernia repair  2008    Right, left remotely  . Total knee arthroplasty      right knee partial 2002  . Tonsillectomy    . Shoulder preplacement  2008    left, partial  . Ptca w/ coated stent lad      Cx secondary to tear  . Foot surgery  11-08    for removal of bone spurs- right foot  . Reconstruction of ankle  6-09    Right- [facility]  . L4-5 diskectomy w/ fusion, cage placement and rods  12-10    [place]  . Partial knee arthroplasty      left  . Total shoulder replacement      right  . Joint replacement    . Cardiac catheterization  01-24-12    coronary stents- no problems  . Cataract extraction, bilateral  01-24-12    Bilateral  . Orif shoulder fracture  02/06/2012    Procedure: OPEN REDUCTION INTERNAL FIXATION (ORIF) SHOULDER FRACTURE;  Surgeon: Nita Sells,  MD;  Location: [facility];  Service: Orthopedics;  Laterality: Left;  ORIF of a Left Shoulder Fracture with  Iliac Crest Bone Graft aspiration   . Harvest bone graft  02/06/2012    Procedure: HARVEST ILIAC BONE GRAFT;  Surgeon: Nita Sells, MD;  Location: [facility];  Service: Orthopedics;;  bone marrow aspirqation   . Hardware removal  03/09/2012    Procedure: HARDWARE REMOVAL;  Surgeon: Nita Sells, MD;  Location: [facility];  Service: Orthopedics;  Laterality: Left;  Hardware Removal from Left Shoulder   Social History:  reports that he has never smoked. He has never used smokeless tobacco. He reports that he does not drink alcohol or use illicit drugs. He  own  a company packing product. This is his second Marriage. He has one son and a daughter. He live with Wife.    Allergies  Allergen Reactions  . Penicillins Hives, Shortness Of Breath and Rash    REACTION: hives, breathing problems  . Morphine And Related Nausea And Vomiting and Swelling    Family History  Problem Relation Age of Onset  . Uterine cancer Mother     survivor  . Macular degeneration Mother   . Other Mother     ankle edema  . Lung cancer Mother   . Coronary artery disease Father   . Hypertension Father   . Prostate cancer Father   . Colon polyps Father   . Thyroid disease Sister   . Heart attack Brother   . Hyperlipidemia Brother   . Other Brother     Schizophrenic  . Diabetes Neg Hx   . Colon cancer Neg Hx   . Esophageal cancer Neg Hx   . Rectal cancer Neg Hx   . Stomach cancer Neg Hx   . Coronary artery disease Maternal Aunt   . Heart attack Maternal Aunt   . Rheum arthritis Sister   . Hemochromatosis Sister     Prior to Admission medications   Medication Sig Start Date End Date Taking? Authorizing Provider  Aclidinium Bromide (TUDORZA PRESSAIR) 400 MCG/ACT AEPB Inhale 1 puff into the lungs 2 (two) times daily. 01/23/12  Yes Neena Rhymes, MD  adalimumab (HUMIRA) 40 MG/0.8ML injection Inject 40 mg into the skin every 14 (fourteen) days.   Yes Historical Provider, MD  ALPRAZolam (XANAX XR) 1 MG 24 hr tablet Take 1 tablet (1 mg total) by mouth every morning. 04/06/12  Yes Neena Rhymes, MD  ALPRAZolam Duanne Moron) 0.5 MG tablet Take 1 tablet (0.5 mg total) by mouth 3 (three) times daily as needed for sleep or anxiety. 04/06/12 04/06/13 Yes Neena Rhymes, MD  aspirin (ASPIRIN EC) 81 MG EC tablet Take 81 mg by mouth every morning.    Yes Historical Provider, MD  celecoxib (CELEBREX) 200 MG capsule Take 200 mg by mouth 2 (two) times daily.   Yes Historical Provider, MD  cholecalciferol (VITAMIN D) 1000 UNITS tablet Take 1 tablet (1,000 Units total) by  mouth daily. 06/24/12 06/24/13 Yes Evie Lacks Plotnikov, MD  colchicine 0.6 MG tablet Take 1 tablet (0.6 mg total) by mouth 2 (two) times daily. 06/30/12  Yes Neena Rhymes, MD  FLUoxetine (PROZAC) 40 MG capsule Take 2 capsules (80 mg total) by mouth daily. 04/06/12  Yes Neena Rhymes, MD  fluticasone (FLONASE) 50 MCG/ACT nasal spray Place 2 sprays into the nose daily.   Yes Historical Provider, MD  furosemide (LASIX) 20 MG tablet Take 20 mg by mouth  every morning.   Yes Historical Provider, MD  gabapentin (NEURONTIN) 300 MG capsule Take 300 mg by mouth 3 (three) times daily. 2 every am, 1 at noon, 2 at bedtime, 1 at 1:00 am.   Yes Historical Provider, MD  hydrochlorothiazide (HYDRODIURIL) 25 MG tablet Take 25 mg by mouth every morning.   Yes Historical Provider, MD  HYDROmorphone (DILAUDID) 2 MG tablet Take 1 tablet (2 mg total) by mouth every 4 (four) hours as needed for pain. May take up to 2 tablets if needed. Fill on or after 07/03/12. 06/24/12  Yes Neena Rhymes, MD  losartan (COZAAR) 50 MG tablet Take 50 mg by mouth daily.   Yes Historical Provider, MD  meclizine (ANTIVERT) 12.5 MG tablet Take 1-2 tablets (12.5-25 mg total) by mouth 3 (three) times daily as needed. 06/24/12  Yes Evie Lacks Plotnikov, MD  Melatonin ([employer] MELATONIN) 5 MG TABS Take 5 mg by mouth at bedtime.    Yes Historical Provider, MD  oxyCODONE (OXYCONTIN) 20 MG 12 hr tablet Take 1 tablet (20 mg total) by mouth every 12 (twelve) hours. FILL on or after 09/01/12. 06/24/12 06/24/13 Yes Neena Rhymes, MD  pantoprazole (PROTONIX) 40 MG tablet Take 40 mg by mouth 2 (two) times daily.   Yes Historical Provider, MD  Polyethyl Glycol-Propyl Glycol (SYSTANE ULTRA) 0.4-0.3 % SOLN Apply 1 drop to eye daily.   Yes Historical Provider, MD  potassium chloride SA (K-DUR,KLOR-CON) 20 MEQ tablet Take 1 tablet (20 mEq total) by mouth 2 (two) times daily. 04/06/12 04/06/13 Yes Neena Rhymes, MD  predniSONE (DELTASONE) 10 MG tablet Prednisone  10 mg: take 4 tabs a day x 3 days; then 3 tabs a day x 4 days; then 2 tabs a day x 4 days, then 1 tab a day x 6 days, then go back to 5 mg/d. Take pc. 06/24/12  Yes Evie Lacks Plotnikov, MD  predniSONE (DELTASONE) 5 MG tablet Take 5 mg by mouth daily.   Yes Historical Provider, MD  scopolamine (TRANSDERM-SCOP) 1.5 MG Place 1 patch (1.5 mg total) onto the skin every 3 (three) days. 07/01/12  Yes Neena Rhymes, MD  simvastatin (ZOCOR) 40 MG tablet Take 40 mg by mouth every 3 (three) days. Tue, thu , sat   Yes Historical Provider, MD  traZODone (DESYREL) 100 MG tablet Take 1 tablet (100 mg total) by mouth at bedtime. 04/27/12  Yes Neena Rhymes, MD   Physical Exam: Filed Vitals:   07/14/12 1600 07/14/12 1635 07/14/12 1637  BP:  126/82   Pulse:  67   Temp: 97.1 F (36.2 C) 97.1 F (36.2 C)   TempSrc:  Oral   Weight:   69.9 kg (154 lb 1.6 oz)  SpO2:  94%     General Appearance:    Alert, cooperative, no distress, appears stated age  Head:    Normocephalic, without obvious abnormality, atraumatic  Eyes:    PERRL, conjunctiva/corneas clear, EOM's intact,      Ears:    Normal TM's and external ear canals, both ears  Nose:   Nares normal, septum midline, mucosa normal, no drainage    or sinus tenderness  Throat:   Lips, mucosa, and tongue dry;   Neck:   Supple, symmetrical, trachea midline, no adenopathy;       thyroid:  No enlargement/tenderness/nodules; no carotid   bruit or JVD  Back:     Symmetric, no curvature, ROM normal, no CVA tenderness  Lungs:  Clear to auscultation bilaterally, respirations unlabored  Chest wall:    No tenderness or deformity  Heart:    Regular rate and rhythm, S1 and S2 normal, no murmur, rub   or gallop  Abdomen:     Soft, non-tender, bowel sounds active all four quadrants,    no masses, no organomegaly        Extremities:   Extremities normal, atraumatic, no cyanosis or edema  Pulses:   2+ and symmetric all extremities  Skin:   Skin color, texture,  turgor normal, no rashes or lesions  Lymph nodes:   Cervical, supraclavicular, and axillary nodes normal  Neurologic:   Oriented to place, year, was not able to say day, recognized wife, at time has difficulty finding words,  Normal strength, sensation and reflexes. Throughout, gait not evaluated.     Labs on Admission:  Basic Metabolic Panel: No results found for this basename: NA, K, CL, CO2, GLUCOSE, BUN, CREATININE, CALCIUM, MG, PHOS,  in the last 168 hours Liver Function Tests: No results found for this basename: AST, ALT, ALKPHOS, BILITOT, PROT, ALBUMIN,  in the last 168 hours No results found for this basename: LIPASE, AMYLASE,  in the last 168 hours No results found for this basename: AMMONIA,  in the last 168 hours CBC:  Recent Labs Lab 07/14/12 1618  WBC 6.2  HGB 14.3  HCT 40.5  MCV 87.1  PLT 176   Cardiac Enzymes: No results found for this basename: CKTOTAL, CKMB, CKMBINDEX, TROPONINI,  in the last 168 hours  BNP (last 3 results) No results found for this basename: PROBNP,  in the last 8760 hours CBG: No results found for this basename: GLUCAP,  in the last 168 hours  Radiological Exams on Admission: No results found.    Assessment/Plan Active Problems:   ANXIETY   Atrial fibrillation   Ataxia   Acute confusional state   1-Encephalopathy/Gait disturbance: Admit to telemetry. I will repeat CT head due to new confusion, worsening gait. Will defer repeat MRI and ordering LP to Neurologist. I will order C-met, TSH, alcohol level, UDS, B-12, RPR, alcohol level,  ammonia level. Will due a work up for infection, will check UA, Chest x ray. Less likely effect from Medications, he has been on xanax and opioid for long time. Hold scopolamine.  2-Chronic pain Syndrome: Due to concern for sedation,  I will hold long acting opioid. Patient per wife has being more sleepy,Will continue with PRN dilaudid. Please consider resume long acting opioid tomorrow. I will continue with  xanax PRN. Will hold schedule xanax.  3-Depression/Anxiety: Continue with Prozac and PRN xanax.  4-History of A fib: Patient Hr in the 60. Check EKG. 5-Hyponatremia: mild. IV fluids. 6-Hypokalemia: replete with 40 meq po times 2.    Family Communication: Care discussed with wife who was at bedside.  Disposition Plan: Expect 3 to 4 days inpatient.   Time spent: more than 70 minutes.   Charnee Turnipseed [facility] Pager [phone]  If 7PM-7AM, please contact night-coverage [email or web address] Password [facility] 07/14/2012, 4:58 PM   Addendum: Swallow evaluation, PT evaluation. Might need vestibular evaluation

## 2012-07-14 NOTE — Procedures (Signed)
Indication: Confusion, ataxia  Risks of the procedure were dicussed with the patient including post-LP headache, bleeding, infection, weakness/numbness of legs(radiculopathy), death.  The patient/patient's wife agreed and written consent was obtained.   The patient was prepped and draped, and using sterile technique a 20 gauge quinke spinal needle was inserted at the level of L5. He has had multiple back surgeries and CSF was not obtained after three passes. In this setting, given the history of surgery, will arrange to have done with IR assistance.

## 2012-07-14 NOTE — Progress Notes (Signed)
Upon arrival, patient gave his watch and his wedding ring to his wife to watch over  [name], Dirk Dress

## 2012-07-14 NOTE — Consult Note (Signed)
Reason for Consult: Confusion, vertigo Referring Physician: Niel Hummer  CC: dizziness  History is obtained from:Patient, Wife  HPI: [name] is a 67 y.o. male who intially had problems on awakening approximately 3 weeks ago. He states that he woke up confused in the middle of the night, not knowing where he was. His confusion seemed better the next morning, but he was significantly "dizzy" by which he means that he had difficulty with balance and had some spinning sensation.   He saw his PCP who diagnosed him with vertigo and started prednisone and meclizine. Over the past 48 hours, his wife has noticed that he has been significantly more confused.   He had a spinal steroid injection 10 weeks ago.    ROS: A 14 point ROS was performed and is negative except as noted in the HPI.  Past Medical History  Diagnosis Date  . Hx of colonic polyps   . Diverticulosis   . Hypertension   . Osteoarthritis   . 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 CAD (coronary artery disease)     minimal coronary plaque in the LAD and right coronary system. 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 7-06. Last nuclear stress 11-17-06 fixed anterior/ inferior defect, no inducible ischemia, EF 81%  . Allergic rhinitis   . Hx of colonoscopy   . RA (rheumatoid arthritis)   . COPD (chronic obstructive pulmonary disease)   . PONV (postoperative nausea and vomiting)   . Dysrhythmia 01-24-12    past hx. A.Fib x1 episode-responded to med.    Family History: Mother - lung cancer Sister - RA  Social History: Tob: Never Smoker  Exam: Current vital signs: BP 126/82  Pulse 67  Temp(Src) 97.1 F (36.2 C) (Oral)  Ht 5' 5"  (1.651 m)  Wt 69.854 kg (154 lb)  BMI 25.63 kg/m2  SpO2 94% Vital signs in last 24 hours: Temp:  [97.1 F (36.2 C)-97.6 F (36.4 C)] 97.1 F (36.2 C) (04/08  1635) Pulse Rate:  [67-72] 67 (04/08 1635) BP: (118-129)/(76-82) 126/82 mmHg (04/08 1635) SpO2:  [94 %] 94 % (04/08 1658) FiO2 (%):  [44 %] 44 % (04/08 1658) Weight:  [69.854 kg (154 lb)-69.9 kg (154 lb 1.6 oz)] 69.854 kg (154 lb) (04/08 1700)  General: In bed, NAD CV: RRR Mental Status: Patient is awake, alert, oriented to person, place, month, year, and situation. Immediate and remote memory are intact. Patient is able to give a clear and coherent history. Unable to spell world backwards  Able to give # of quarters in $2.75 No signs of aphasia or neglect.  Cranial Nerves: II: Visual Fields are full. Pupils are equal, round, and reactive to light.  Discs without papilledema. III,IV, VI: EOMI without ptosis or diploplia. He has impaired leftward saccades.  V: Facial sensation is symmetric to temperature VII: Facial movement is symmetric.  VIII: hearing is intact to voice X: Uvula elevates symmetrically XI: Shoulder shrug is symmetric. XII: tongue is midline without atrophy or fasciculations.  Motor: Tone is normal. Bulk is normal. 5/5 strength was present in all four extremities.  Sensory: Sensation is symmetric to light touch and temperature in the arms and legs. Deep Tendon Reflexes: 2+ and symmetric in the biceps and patellae.  Cerebellar: Very mild tremor on FNF bilateraly, but not ataxic. He does appear to have some ataxia on HKS bilaterally.  Gait: Patient has a slightly wide based gait. When attempting to tandem, he falls.   Head thrust to the left shows a corrective saccade.    I have reviewed labs in epic and the results pertinent to this consultation are: BMP - mild hyponatremia, hypokalemia  I have reviewed the images obtained:MRI brain - No acute findings.  CT today, no changes.   Impression: 67 yo M with worsening confusion and persistent impairment in gait that started three weeks ago. His exam is consistent with a problem that is most likely peripheral, or  if intraxial at the level of the vestibular nucleus. Vestibular neuritis or labyrinthitis in the most typical cause, but is usually(though not always) shorter in duration than his symptoms.    His confusion is unusual for someone with vestibular neuronitis and in the setting of someone who recently had an epidural steroid injection and is immunocompromised, I feel that ruling out infectious causes would be prudent. He does have an autoimmune disease and autoimmune causes of confusion + ataxia(e.g. Paraneoplastic syndrome).   Finally, it is possible that he has a vestibular neuronitis and that the anticholinergic treatments for this problem have caused his confusion.   Recommendations: 1) LP with cell count+diff, protien, glucose, fungal stain, crypto ag, CMV, HSV, VZV PCR, oligoclonal bands+IgG index, VDRL.  2) Stop both scopalamine and meclizine.  3) If no improvement in confusion after stopping anti-cholinergics and no answers on LP, would consider sending panel for autoimmune causes of ataxia, including paraneoplastic antibodies.  3) If confusion improves after stopping anticholinergics,    Roland Rack, MD [facility] [phone]  If 7pm- 7am, please page neurology on call at [phone].

## 2012-07-14 NOTE — Patient Instructions (Addendum)
We are going to admit she to the hospital for further testing and evaluation of your him balance, and altered mental status. I will see you back after your release from the hospital.

## 2012-07-15 ENCOUNTER — Inpatient Hospital Stay (HOSPITAL_COMMUNITY): Payer: Medicare Other

## 2012-07-15 DIAGNOSIS — M069 Rheumatoid arthritis, unspecified: Secondary | ICD-10-CM

## 2012-07-15 DIAGNOSIS — R279 Unspecified lack of coordination: Principal | ICD-10-CM

## 2012-07-15 DIAGNOSIS — M51379 Other intervertebral disc degeneration, lumbosacral region without mention of lumbar back pain or lower extremity pain: Secondary | ICD-10-CM

## 2012-07-15 DIAGNOSIS — F05 Delirium due to known physiological condition: Secondary | ICD-10-CM

## 2012-07-15 DIAGNOSIS — M5137 Other intervertebral disc degeneration, lumbosacral region: Secondary | ICD-10-CM

## 2012-07-15 DIAGNOSIS — I4949 Other premature depolarization: Secondary | ICD-10-CM

## 2012-07-15 LAB — GRAM STAIN

## 2012-07-15 LAB — CSF CELL COUNT WITH DIFFERENTIAL
RBC Count, CSF: 58 /mm3 — ABNORMAL HIGH
Tube #: 1
WBC, CSF: 1 /mm3 (ref 0–5)

## 2012-07-15 LAB — PROTEIN AND GLUCOSE, CSF
Glucose, CSF: 56 mg/dL (ref 43–76)
Total  Protein, CSF: 142 mg/dL — ABNORMAL HIGH (ref 15–45)

## 2012-07-15 LAB — VITAMIN B12: Vitamin B-12: 655 pg/mL (ref 211–911)

## 2012-07-15 LAB — TSH: TSH: 0.84 u[IU]/mL (ref 0.350–4.500)

## 2012-07-15 LAB — CRYPTOCOCCAL ANTIGEN, CSF: Crypto Ag: NEGATIVE

## 2012-07-15 LAB — RPR: RPR Ser Ql: NONREACTIVE

## 2012-07-15 MED ORDER — HYDROMORPHONE HCL PF 1 MG/ML IJ SOLN
3.0000 mg | INTRAMUSCULAR | Status: DC | PRN
  Filled 2012-07-15: qty 1

## 2012-07-15 MED ORDER — OXYCODONE HCL ER 10 MG PO T12A
20.0000 mg | EXTENDED_RELEASE_TABLET | Freq: Two times a day (BID) | ORAL | Status: DC
  Administered 2012-07-15 – 2012-07-16 (×2): 20 mg via ORAL
  Filled 2012-07-15: qty 1
  Filled 2012-07-15 (×3): qty 2

## 2012-07-15 MED ORDER — ENSURE COMPLETE PO LIQD
237.0000 mL | Freq: Two times a day (BID) | ORAL | Status: DC
  Administered 2012-07-15 – 2012-07-16 (×3): 237 mL via ORAL

## 2012-07-15 MED ORDER — HYDROMORPHONE HCL 2 MG PO TABS
2.0000 mg | ORAL_TABLET | ORAL | Status: DC | PRN
  Administered 2012-07-15 – 2012-07-16 (×6): 2 mg via ORAL
  Filled 2012-07-15 (×6): qty 1

## 2012-07-15 MED ORDER — OXYCODONE HCL ER 10 MG PO T12A
20.0000 mg | EXTENDED_RELEASE_TABLET | ORAL | Status: AC
  Administered 2012-07-15: 20 mg via ORAL

## 2012-07-15 NOTE — Progress Notes (Signed)
Orders received for swallow evaluation however patient has passed the RN stroke swallow screen and is tolerating a regular diet, thin liquids well per RN without difficulty. Defer formal evaluation per protocol. Please reconsult if needed.   [place], [place] [phone]

## 2012-07-15 NOTE — Progress Notes (Addendum)
Physical Therapy Treatment/Vestibular Eval Patient Details Name: [name] MRN: [id number] DOB: 05-19-1945 Today's Date: 07/15/2012 Time: 5701-7793 PT Time Calculation (min): 24 min  PT Assessment / Plan / Recommendation Comments on Treatment Session  Patient with 3 week history of dizziness, decreased balance/ataxia/increased tremor, decreased coodination and left fingers numb.  Despite negative CT/MRI wonder if this is more central issue.  Will further assess and continue to teach visual compensation and likely trial assitive device.  Agree with outpatient vestibular PT follow up upon d/c.      Follow Up Recommendations  Outpatient PT;Supervision - Intermittent           Equipment Recommendations  Other (comment) (TBA)       Frequency Min 3X/week   Plan Discharge plan remains appropriate    Precautions / Restrictions Precautions Precautions: Fall   Pertinent Vitals/Pain No pain complaints in session    Mobility  Transfers Sit to Stand: 4: Min guard;From chair/3-in-1;With armrests Stand to Sit: 4: Min guard;To chair/3-in-1 Details for Transfer Assistance: initial unsteadiness with rocker bottom shoes and posterior bias initially Ambulation/Gait Ambulation/Gait Assistance: 5: Supervision;4: Min assist Ambulation Distance (Feet): 150 Feet Assistive device: None Ambulation/Gait Assistance Details: ambulation with head turns and nods, aggravated symptoms to point of nausea once returned to seated position; veers to right with min assist needed to correct balance. Gait Pattern: Step-through pattern;Ataxic;Wide base of support Gait velocity: moves quickly    Vestibular Assessment Seated oculomotor exam: Smooth pursuits WNL, saccades difficulty with some frog hopping and initially dysmetria horizontal, reports increasd difficulty with vertical saccades with noted difficuly finding target.  Vestibular ocular reflex: difficulty with target maintenance especially with vertical  VOR; demonstrates positve head thrust to both sides, worse to left, VOR cancellation with decreased target maintenance and increased blurring. Coordination testing: demonstrates decreased accurracy with pronation supination, heel to shin decreased with left on right, finger to nose intact, slowed fine motor with touching fingertips to thumb, intact finger to nose eyes closed.  Intact toe tapping.  Positional testing deferred due to pt to go to EEG this pm.     PT Goals Acute Rehab PT Goals Pt will go Sit to Stand: with modified independence PT Goal: Sit to Stand - Progress: Progressing toward goal Pt will go Stand to Sit: with modified independence PT Goal: Stand to Sit - Progress: Progressing toward goal PT Goal: Ambulate - Progress: Progressing toward goal Additional Goals Additional Goal #1: Patient to verbalize understanding of visual compensatory strategies and demonstrate improved safety with slower movements for decreased fall risk. PT Goal: Additional Goal #1 - Progress: Goal set today  Visit Information  Last PT Received On: 07/15/12    Subjective Data  Subjective: Patient describes symptoms as out of body experience and some room spinning looking to left, but not noted with head held to left; did stop wearing glasses about 4 weeks ago due to MD stated didn't need them (wore for 20 years, cataract surgery recent.)  Had 2 falls in the 3 weeks since onset of symptoms both at night and reports no injury, but also reports imbalance.  Has noticed coincidentally left fingertips have gone numb.  Has had tremor in past, but described more pronounced now.  No changes in hearing.  Reports nausea and decreasd appetite.   Cognition  Cognition Overall Cognitive Status: Appears within functional limits for tasks assessed/performed Arousal/Alertness: Awake/alert Orientation Level: Appears intact for tasks assessed Behavior During Session: [facility] for tasks performed    Balance  Dynamic  Standing  Balance Dynamic Standing - Balance Support: No upper extremity supported;During functional activity Dynamic Standing - Level of Assistance: 4: Min assist Dynamic Standing - Comments: in bathroom observed leaned posterior catching on wall behind him  End of Session PT - End of Session Equipment Utilized During Treatment: Gait belt Activity Tolerance: Other (comment) (limited by nausea) Patient left: in chair;with nursing in room   GP     [facility] 07/15/2012, 5:03 PM [place], [place] 07/15/2012

## 2012-07-15 NOTE — Progress Notes (Signed)
Subjective: Patient continues to have some mild confusion.  Made errors on counting backwards from 100 by 5's and on spelling WORLD backwards.  LP unable to be performed overnight.    Objective: Current vital signs: BP 143/69  Pulse 90  Temp(Src) 97.7 F (36.5 C) (Oral)  Resp 18  Ht 5' 5"  (1.651 m)  Wt 67.614 kg (149 lb 1 oz)  BMI 24.81 kg/m2  SpO2 94% Vital signs in last 24 hours: Temp:  [97.1 F (36.2 C)-98.8 F (37.1 C)] 97.7 F (36.5 C) (04/09 1045) Pulse Rate:  [67-95] 90 (04/09 1045) Resp:  [16-18] 18 (04/09 1045) BP: (114-143)/(65-87) 143/69 mmHg (04/09 1045) SpO2:  [91 %-94 %] 94 % (04/09 1045) FiO2 (%):  [44 %] 44 % (04/08 1658) Weight:  [67.614 kg (149 lb 1 oz)-69.9 kg (154 lb 1.6 oz)] 67.614 kg (149 lb 1 oz) (04/09 0500)  Intake/Output from previous day: 04/08 0701 - 04/09 0700 In: 900 [I.V.:900] Out: 800 [Urine:800] Intake/Output this shift:   Nutritional status: General  Neurologic Exam: Mental Status:  Patient is awake, alert, oriented to person, place, month, year, and situation.  No signs of aphasia or neglect.  Cranial Nerves:  II: Visual Fields are full. Pupils are equal, round, and reactive to light. Discs without papilledema.  III,IV, VI: EOMI without ptosis or diploplia. He has impaired leftward saccades.  V: Facial sensation is symmetric to temperature  VII: Facial movement is symmetric.  VIII: hearing is intact to voice  X: Uvula elevates symmetrically  XI: Shoulder shrug is symmetric.  XII: tongue is midline without atrophy or fasciculations.  Motor:  5/5 throughout Sensory:  Pinprick and light touch intact Deep Tendon Reflexes:  2+ and symmetric in the biceps and patellae.  Cerebellar:  Very mild tremor on FNF bilateraly, but not ataxic. He does appear to have some ataxia on HKS bilaterally.  Gait:  Patient has a slightly wide based gait. When attempting to tandem, he falls.   Lab Results: Basic Metabolic Panel:  Recent Labs Lab  07/14/12 1618  NA 134*  K 3.3*  CL 89*  CO2 37*  GLUCOSE 84  BUN 14  CREATININE 0.86  CALCIUM 9.5    Liver Function Tests:  Recent Labs Lab 07/14/12 1618  AST 21  ALT 25  ALKPHOS 69  BILITOT 0.7  PROT 6.8  ALBUMIN 3.7   No results found for this basename: LIPASE, AMYLASE,  in the last 168 hours  Recent Labs Lab 07/14/12 1619  AMMONIA 22    CBC:  Recent Labs Lab 07/14/12 1618  WBC 6.2  HGB 14.3  HCT 40.5  MCV 87.1  PLT 176    Cardiac Enzymes: No results found for this basename: CKTOTAL, CKMB, CKMBINDEX, TROPONINI,  in the last 168 hours  Lipid Panel: No results found for this basename: CHOL, TRIG, HDL, CHOLHDL, VLDL, LDLCALC,  in the last 168 hours  CBG: No results found for this basename: GLUCAP,  in the last 168 hours  Microbiology: Results for orders placed during the hospital encounter of 01/24/12  SURGICAL PCR SCREEN     Status: None   Collection Time    01/24/12 10:00 AM      Result Value Range Status   MRSA, PCR NEGATIVE  NEGATIVE Final   Staphylococcus aureus NEGATIVE  NEGATIVE Final   Comment:            The Xpert SA Assay (FDA     approved for NASAL specimens  in patients over 62 years of age),     is one component of     a comprehensive surveillance     program.  Test performance has     been validated by [employer] for patients greater     than or equal to 39 year old.     It is not intended     to diagnose infection nor to     guide or monitor treatment.    Coagulation Studies: No results found for this basename: LABPROT, INR,  in the last 72 hours  Imaging: Dg Chest 2 View  07/14/2012  *RADIOLOGY REPORT*  Clinical Data: Confusion, dizziness  CHEST - 2 VIEW  Comparison: 09/04/2011  Findings: Chronic right hemidiaphragm elevation.  Streaky bibasilar opacities persist compatible with atelectasis, slightly worse in the left lower lobe.  No effusion or pneumothorax.  Negative for edema.  Stable heart size.  Bilateral  shoulder arthroplasties evident.  IMPRESSION: Chronic right hemidiaphragm elevation with increased basilar atelectasis.   Original Report Authenticated By: Jerilynn Mages. Annamaria Boots, M.D.    Ct Head Wo Contrast  07/14/2012  *RADIOLOGY REPORT*  Clinical Data: Altered mental status.  CT HEAD WITHOUT CONTRAST  Technique:  Contiguous axial images were obtained from the base of the skull through the vertex without contrast.  Comparison: CT of the head performed 06/23/2012, and MRI of the brain performed 06/30/2012  Findings: There is no evidence of acute infarction, mass lesion, or intra- or extra-axial hemorrhage on CT.  Mild periventricular white matter change likely reflects small vessel ischemic microangiopathy.  The posterior fossa, including the cerebellum, brainstem and fourth ventricle, is within normal limits.  The third and lateral ventricles, and basal ganglia are unremarkable in appearance.  The cerebral hemispheres are symmetric in appearance, with normal gray- white differentiation.  No mass effect or midline shift is seen.  There is no evidence of fracture; visualized osseous structures are unremarkable in appearance.  The visualized portions of the orbits are within normal limits.  The paranasal sinuses and mastoid air cells are well-aerated.  No significant soft tissue abnormalities are seen.  IMPRESSION:  1.  No acute intracranial pathology seen on CT. 2.  Mild periventricular white matter change likely reflects small vessel ischemic microangiopathy.   Original Report Authenticated By: Santa Lighter, M.D.     Medications:  I have reviewed the patient's current medications. Scheduled: . sodium chloride   Intravenous Daily  . cholecalciferol  1,000 Units Oral Daily  . FLUoxetine  80 mg Oral Daily  . gabapentin  300 mg Oral TID  . predniSONE  5 mg Oral Q breakfast  . sodium chloride  3 mL Intravenous Q12H  . tiotropium  18 mcg Inhalation Daily  . traZODone  100 mg Oral QHS    Assessment/Plan: 67 year old  with confusion and vertigo on presentation.  Reports that vertigo improved but continues to have some mild confusion.  Anticholinergics have been discontinued.  Patient on immunosuppressants and epidural steroid injections as an outpatient.    Recommendations: 1.  LP by IR today to rule out atypical infection 2.  Remain off anticholinergics.    LOS: 1 day   Alexis Goodell, MD [facility] [phone] 07/15/2012  2:13 PM

## 2012-07-15 NOTE — Progress Notes (Signed)
Physical Therapy Evaluation Patient Details Name: [name] MRN: [id number] DOB: October 10, 1945 Today's Date: 07/15/2012 Time: 1005-1029 PT Time Calculation (min): 24 min  PT Assessment / Plan / Recommendation Clinical Impression  Pt is 67 yo male with confusion that has resolved and gait abnormality that persists. He continues with dizziness that has caused 2 falls at home and a worsened UE tremor. Recommend a vestibular evaluation for further look at origin of dizziness. PT will follow to address gait abnormality and safety. Recommend OP PT at d/c. Equip needs TBD with progress. PT will follow.     PT Assessment  Patient needs continued PT services    Follow Up Recommendations  Outpatient PT;Supervision - Intermittent    Does the patient have the potential to tolerate intense rehabilitation      Barriers to Discharge None      Equipment Recommendations  Other (comment) (TBD)    Recommendations for Other Services     Frequency Min 3X/week    Precautions / Restrictions Precautions Precautions: Fall Restrictions Weight Bearing Restrictions: No   Pertinent Vitals/Pain 9/10 joint pain, especially back, ambulation helped some      Mobility  Bed Mobility Bed Mobility: Supine to Sit;Sitting - Scoot to Edge of Bed Supine to Sit: 6: Modified independent (Device/Increase time);HOB flat Sitting - Scoot to Edge of Bed: 6: Modified independent (Device/Increase time) Details for Bed Mobility Assistance: pt reports dizziness with initial sitting even though he came to the right, subsided in a few minutes Transfers Transfers: Sit to Stand;Stand to Sit Sit to Stand: 4: Min guard Stand to Sit: 4: Min guard Details for Transfer Assistance: dizziness again with inital change in position but then subsided Ambulation/Gait Ambulation/Gait Assistance: 5: Supervision Ambulation Distance (Feet): 150 Feet Assistive device: Other (Comment) (pushed IV pole to simulate  cane) Ambulation/Gait Assistance Details: pt prefers to wear his rocker bottom shoes to help with ankle and foot pain. Slight posterior lean, especially with quick stopping. Tends to move quickly which does not help his dizziness. Worked on gaze stabilization with turning left which did help. Widened BOS with feet externally rotated. Gait Pattern: Step-through pattern;Wide base of support Gait velocity: WFL Stairs: No Wheelchair Mobility Wheelchair Mobility: No    Exercises     PT Diagnosis: Abnormality of gait;Difficulty walking  PT Problem List: Decreased strength;Decreased range of motion;Decreased activity tolerance;Decreased balance;Decreased mobility;Decreased coordination;Decreased knowledge of use of DME;Decreased knowledge of precautions;Pain PT Treatment Interventions: DME instruction;Gait training;Stair training;Functional mobility training;Therapeutic activities;Therapeutic exercise;Balance training;Neuromuscular re-education;Patient/family education   PT Goals Acute Rehab PT Goals PT Goal Formulation: With patient Time For Goal Achievement: 07/29/12 Potential to Achieve Goals: Good Pt will go Sit to Stand: with modified independence PT Goal: Sit to Stand - Progress: Goal set today Pt will go Stand to Sit: with modified independence PT Goal: Stand to Sit - Progress: Goal set today Pt will Ambulate: >150 feet;with modified independence;with least restrictive assistive device PT Goal: Ambulate - Progress: Goal set today Pt will Go Up / Down Stairs: 3-5 stairs;with rail(s);with modified independence PT Goal: Up/Down Stairs - Progress: Goal set today  Visit Information  Last PT Received On: 07/15/12 Assistance Needed: +1    Subjective Data  Subjective: I get really dizzy whenever I turn my head to the left Patient Stated Goal: return home   Prior [place] Lives With: Significant other Available Help at Discharge: Family;Available PRN/intermittently Type  of Home: House Home Access: Stairs to enter Entrance Stairs-Number of Steps: 5 Entrance  Stairs-Rails: Right Home Layout: Two level;Able to live on main level with bedroom/bathroom Home Adaptive Equipment: Straight cane Additional Comments: pt does not usually use an assistive device but recently has used a cane a little bit. He has fallen a couple of times when getting up at night, no serious injuries.  Prior Function Level of Independence: Independent Able to Take Stairs?: Yes Driving: No Vocation: Unemployed Comments: pt has not driven in the past 3 wks due to dizziness and did not drive long distances even before that Communication Communication: No difficulties Dominant Hand: Right    Cognition  Cognition Overall Cognitive Status: Appears within functional limits for tasks assessed/performed Orientation Level: Appears intact for tasks assessed Behavior During Session: [facility] for tasks performed    Extremity/Trunk Assessment Right Upper Extremity Assessment RUE ROM/Strength/Tone: Deficits RUE ROM/Strength/Tone Deficits: pre-existing deficits due to RA and multiple surgeries RUE Sensation: WFL - Light Touch Left Upper Extremity Assessment LUE ROM/Strength/Tone: Deficits LUE ROM/Strength/Tone Deficits: similar to RUE LUE Sensation: WFL - Light Touch Right Lower Extremity Assessment RLE ROM/Strength/Tone: Deficits RLE ROM/Strength/Tone Deficits: pt has had an ankle joint replacement and he reports that RA is the worst in his right foot, this causes him to ambulate with abnormal gait. Hip and knee strength grossly 4/5 RLE Sensation: WFL - Light Touch RLE Coordination: WFL - gross motor Left Lower Extremity Assessment LLE ROM/Strength/Tone: Deficits LLE ROM/Strength/Tone Deficits: grossly 4/5 throughout though pt has difficulties with this leg as well due to RA LLE Sensation: WFL - Light Touch LLE Coordination: WFL - gross motor Trunk Assessment Trunk Assessment: Normal    Balance Balance Balance Assessed: Yes Dynamic Standing Balance Dynamic Standing - Balance Support: No upper extremity supported;During functional activity Dynamic Standing - Level of Assistance: 4: Min assist Dynamic Standing - Comments: pt occasionally loses balance bkwd, rocker bottom shoes may be contributing to this. Also tends to list to right.   End of Session PT - End of Session Equipment Utilized During Treatment: Gait belt Activity Tolerance: Patient tolerated treatment well Patient left: in chair;with call bell/phone within reach Nurse Communication: Mobility status  GP   Leighton Roach, [place]  [place], [place] 07/15/2012, 12:10 PM

## 2012-07-15 NOTE — Procedures (Signed)
Fluroscopic guide lumbar puncture performed at L4 (patient status post L4-L5 PLIF and laminectomy).  11 mL clear CSF obtained.  No complications.  See PACS for full dictation.

## 2012-07-15 NOTE — Progress Notes (Signed)
EEG completed.

## 2012-07-15 NOTE — Progress Notes (Signed)
INITIAL NUTRITION ASSESSMENT  Pt meets criteria for MODERATE (Non-severe) MALNUTRITION in the context of acute illness as evidenced by 4% weight loss x 3 weeks and estimated intake of < 75% of needs for > 7 days.  DOCUMENTATION CODES Per approved criteria  -Non-severe (moderate) malnutrition in the context of acute illness or injury   INTERVENTION: Ensure Complete po BID, each supplement provides 350 kcal and 13 grams of protein.  NUTRITION DIAGNOSIS: Inadequate oral intake related to dizziness as evidenced by 4% weight loss x 3 weeks.   Goal: Pt to meet >/= 90% of their estimated nutrition needs.   Monitor:  PO intake, weight trend, supplement acceptance  Reason for Assessment: Positive Malnutrition Screening Tool  67 y.o. male  Admitting Dx: Confusion  ASSESSMENT: Pt admitted with confusion and dizziness x 3 weeks. Per pt he has lost 4% of his body weight in 3 weeks due to dizziness. Has been able to eat Breakfast and Lunch at home but never [place] recently. Pt is familiar with ensure from previous use years ago and is willing to try some now.  S/P LP 4/9 to rule out infection.   Height: Ht Readings from Last 1 Encounters:  07/14/12 5' 5"  (1.651 m)    Weight: Wt Readings from Last 1 Encounters:  07/15/12 149 lb 1 oz (67.614 kg)    Ideal Body Weight: 61.8 kg  % Ideal Body Weight: 109%  Wt Readings from Last 10 Encounters:  07/15/12 149 lb 1 oz (67.614 kg)  07/14/12 154 lb (69.854 kg)  07/02/12 152 lb (68.947 kg)  07/01/12 152 lb 12.8 oz (69.31 kg)  06/24/12 155 lb (70.308 kg)  06/02/12 159 lb (72.122 kg)  05/25/12 163 lb 2 oz (73.993 kg)  04/06/12 155 lb 1.3 oz (70.344 kg)  03/09/12 152 lb 6 oz (69.117 kg)  03/09/12 152 lb 6 oz (69.117 kg)    Usual Body Weight: 155 lb   % Usual Body Weight: 96%  BMI:  Body mass index is 24.81 kg/(m^2). WNL  Estimated Nutritional Needs: Kcal: 1700-1900 Protein: 85-95 grams Fluid: > 1.7 L/day  Skin:  incisions  Nutrition Focused Physical Exam:  Subcutaneous Fat:  Orbital Region: WNL Upper Arm Region: WNL Thoracic and Lumbar Region: WNL  Muscle:  Temple Region: WNL Clavicle Bone Region: WNL Clavicle and Acromion Bone Region: WNL Scapular Bone Region: NA Dorsal Hand: WNL Patellar Region: WNL Anterior Thigh Region: WNL Posterior Calf Region: WNL  Edema: not present  Diet Order: General Meal Completion: <50 % per pt   EDUCATION NEEDS: -No education needs identified at this time   Intake/Output Summary (Last 24 hours) at 07/15/12 1506 Last data filed at 07/15/12 0700  Gross per 24 hour  Intake    900 ml  Output    800 ml  Net    100 ml    Last BM: 4/9   Labs:   Recent Labs Lab 07/14/12 1618  NA 134*  K 3.3*  CL 89*  CO2 37*  BUN 14  CREATININE 0.86  CALCIUM 9.5  GLUCOSE 84    CBG (last 3)  No results found for this basename: GLUCAP,  in the last 72 hours  Scheduled Meds: . sodium chloride   Intravenous Daily  . cholecalciferol  1,000 Units Oral Daily  . FLUoxetine  80 mg Oral Daily  . gabapentin  300 mg Oral TID  . predniSONE  5 mg Oral Q breakfast  . sodium chloride  3 mL Intravenous Q12H  .  tiotropium  18 mcg Inhalation Daily  . traZODone  100 mg Oral QHS    Continuous Infusions: . sodium chloride 75 mL/hr at 07/15/12 5364    Past Medical History  Diagnosis Date  . Hx of colonic polyps   . Diverticulosis   . Hypertension   . Osteoarthritis   . 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 CAD (coronary artery disease)     minimal coronary plaque in the LAD and right coronary system. 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 7-06. Last nuclear stress 11-17-06 fixed anterior/ inferior defect, no inducible ischemia, EF 81%  . Allergic rhinitis   . Hx of colonoscopy   . RA (rheumatoid arthritis)   . COPD (chronic obstructive pulmonary  disease)   . PONV (postoperative nausea and vomiting)   . Dysrhythmia 01-24-12    past hx. A.Fib x1 episode-responded to med.    Past Surgical History  Procedure Laterality Date  . Appendectomy    . Inguinal hernia repair  2008    Right, left remotely  . Total knee arthroplasty      right knee partial 2002  . Tonsillectomy    . Shoulder preplacement  2008    left, partial  . Ptca w/ coated stent lad      Cx secondary to tear  . Foot surgery  11-08    for removal of bone spurs- right foot  . Reconstruction of ankle  6-09    Right- [facility]  . L4-5 diskectomy w/ fusion, cage placement and rods  12-10    [place]  . Partial knee arthroplasty      left  . Total shoulder replacement      right  . Joint replacement    . Cardiac catheterization  01-24-12    coronary stents- no problems  . Cataract extraction, bilateral  01-24-12    Bilateral  . Orif shoulder fracture  02/06/2012    Procedure: OPEN REDUCTION INTERNAL FIXATION (ORIF) SHOULDER FRACTURE;  Surgeon: Nita Sells, MD;  Location: [facility];  Service: Orthopedics;  Laterality: Left;  ORIF of a Left Shoulder Fracture with  Iliac Crest Bone Graft aspiration   . Harvest bone graft  02/06/2012    Procedure: HARVEST ILIAC BONE GRAFT;  Surgeon: Nita Sells, MD;  Location: [facility];  Service: Orthopedics;;  bone marrow aspirqation   . Hardware removal  03/09/2012    Procedure: HARDWARE REMOVAL;  Surgeon: Nita Sells, MD;  Location: [place];  Service: Orthopedics;  Laterality: Left;  Hardware Removal from Left Shoulder    [place], [place], [place] Pager [phone] After Hours Pager'

## 2012-07-15 NOTE — Progress Notes (Signed)
Utilization review completed. Febe Champa, RN, BSN. 

## 2012-07-15 NOTE — Progress Notes (Signed)
Subjective: [name] was admitted for progressive gait disorder and increasing confusion. He has been in process of evaluation: CT brain was normal, MRI brain was normal, repeat CT brain without acute changes. He has been treated with meclizine which has not resolved his symptoms. [place] he was more confused and off balance and persented to [facility] ED. He has been seen by neurology and LP attempted w/o success and he is for IR assisted LP.   [name] is awake and conversant. He has good recall of all the events that have transpired. He is not dizzy laying down.   Objective: Lab: Lab Results  Component Value Date   WBC 6.2 07/14/2012   HGB 14.3 07/14/2012   HCT 40.5 07/14/2012   MCV 87.1 07/14/2012   PLT 176 07/14/2012   BMET    Component Value Date/Time   NA 134* 07/14/2012 1618   K 3.3* 07/14/2012 1618   CL 89* 07/14/2012 1618   CO2 37* 07/14/2012 1618   GLUCOSE 84 07/14/2012 1618   BUN 14 07/14/2012 1618   CREATININE 0.86 07/14/2012 1618   CALCIUM 9.5 07/14/2012 1618   GFRNONAA 89* 07/14/2012 1618   GFRAA >90 07/14/2012 1618  U/A clear, Urine tox screen negative, EtOH negative B12 655 ESR 4 TSH 0.84 RPR negative  Imaging:  Scheduled Meds: . sodium chloride   Intravenous Daily  . cholecalciferol  1,000 Units Oral Daily  . FLUoxetine  80 mg Oral Daily  . gabapentin  300 mg Oral TID  . potassium chloride  40 mEq Oral BID  . predniSONE  5 mg Oral Q breakfast  . sodium chloride  3 mL Intravenous Q12H  . tiotropium  18 mcg Inhalation Daily  . traZODone  100 mg Oral QHS   Continuous Infusions: . sodium chloride 75 mL/hr at 07/14/12 1827   PRN Meds:.acetaminophen, acetaminophen, ALPRAZolam, HYDROmorphone, ondansetron (ZOFRAN) IV, ondansetron   Physical Exam: Filed Vitals:   07/15/12 0538  BP: 142/87  Pulse: 95  Temp: 98.7 F (37.1 C)  Resp: 18  gen'l- WNWD sunburned white man in no distress HEENT - C&S clear, PERRLA, no visual field cuts Neck- supple Cor 2+  radial pulse, RRR with rare PVC Pulm - normal respirations Abd- soft, non-tender Neuro - Alert, oriented to person,place, context. Speech is fluent and thought process is normal including good recall. CN II-XII normal facial symmetry and movement, EOMI, no tongue fasciculations. MS.- normal grip strength, able to move legs against resistance. Cerebellar - no tremor, normal rapid finger movement, normal finger to nose maneuver      Assessment/Plan: 1. Neuro - patient with progressive ataxia and episode of confusion who is now mentally clear. Labs to date are negative. Appreciate neuro consult: opining peripheral disease but need to r/o infection.  Plan LP under IR guidance  PT eval  No change in medications  MSK/Rheumatoid arthritis, hemochromatosis all stable.    Adella Hare [facility] (o) [phone]; (c) [name]: [phone]  07/15/2012, 6:40 AM

## 2012-07-16 ENCOUNTER — Other Ambulatory Visit: Payer: Self-pay | Admitting: Internal Medicine

## 2012-07-16 DIAGNOSIS — R270 Ataxia, unspecified: Secondary | ICD-10-CM

## 2012-07-16 DIAGNOSIS — R42 Dizziness and giddiness: Secondary | ICD-10-CM

## 2012-07-16 DIAGNOSIS — F411 Generalized anxiety disorder: Secondary | ICD-10-CM

## 2012-07-16 LAB — HERPES SIMPLEX VIRUS(HSV) DNA BY PCR
HSV 1 DNA: NOT DETECTED
HSV 2 DNA: NOT DETECTED

## 2012-07-16 NOTE — Progress Notes (Signed)
Pt for discharge home today, IV D/C.  D/C instructions and Rx given with verbalized understanding.  Family at bedside to assist pt with discharge. Staff brought pt downstairs via wheelchair.

## 2012-07-16 NOTE — Progress Notes (Signed)
Physical Therapy Treatment Patient Details Name: [name] MRN: [id number] DOB: 10-24-1945 Today's Date: 07/16/2012 Time: 0109-3235 PT Time Calculation (min): 55 min  PT Assessment / Plan / Recommendation Comments on Treatment Session  Patient demonstrated no nystagmus with positional testing therefore BPPV unlikely.  Potentially is peripheral hypofunction he has been unable to compensate for due to recent vacation with airline and boat travel and constant use of Meclizine.  Will benefit from follow up outpatient neurorehab with vestibular therapy.    Follow Up Recommendations  Outpatient PT;Supervision - Intermittent           Equipment Recommendations  None recommended by PT       Frequency Min 3X/week   Plan Discharge plan remains appropriate    Precautions / Restrictions Precautions Precautions: Fall   Pertinent Vitals/Pain No current pain complaints    Mobility  Bed Mobility Bed Mobility: Left Sidelying to Sit;Sit to Sidelying Right;Sit to Sidelying Left;Right Sidelying to Sit Right Sidelying to Sit: 4: Min guard Left Sidelying to Sit: 4: Min guard Supine to Sit: 6: Modified independent (Device/Increase time) Sitting - Scoot to Edge of Bed: 6: Modified independent (Device/Increase time) Sit to Sidelying Right: 4: Min guard Sit to Sidelying Left: 4: Min guard Details for Bed Mobility Assistance: sidelying performed for BPPV testing Transfers Transfers: Stand Pivot Transfers Sit to Stand: 5: Supervision Stand to Sit: 6: Modified independent (Device/Increase time);To chair/3-in-1;To bed Stand Pivot Transfers: 4: Min guard Details for Transfer Assistance: bed>chair without shoes or walker, safest standing to walker with shoes on Ambulation/Gait Ambulation/Gait Assistance: 5: Supervision Ambulation Distance (Feet): 150 Feet Assistive device: Rolling walker Ambulation/Gait Assistance Details: cues for spotting targets with turning.  Pt c/o increased dizziness  throughout ambulation Gait Pattern: Step-through pattern;Trunk flexed    Exercises Other Exercises Other Exercises: performed modified hall pike for testing for BPPV no increased symptoms or nystagmus noted on either side.  Did have increased dizziness to 8/10 sitting up from left sidelying.  Supine head roll test without nystagmus or symptoms Other Exercises: Perfomed standing gaze stability exercise x1 viewing while holding back of chair with target on wall 5' away x 30 sec horizontal and 30 sec vertical.  Did increase to about 4/10 dizziness with vertical head movements.  Issued for HEP with written handout. Other Exercises: Did also reinforce safety precautions with slower movements use of focal target while moving and using walker at all times.    PT Goals Acute Rehab PT Goals Pt will go Sit to Stand: with modified independence PT Goal: Sit to Stand - Progress: Progressing toward goal Pt will go Stand to Sit: with modified independence PT Goal: Stand to Sit - Progress: Progressing toward goal Pt will Ambulate: >150 feet;with modified independence;with least restrictive assistive device PT Goal: Ambulate - Progress: Progressing toward goal Additional Goals Additional Goal #1: Patient to verbalize understanding of visual compensatory strategies and demonstrate improved safety with slower movements for decreased fall risk. PT Goal: Additional Goal #1 - Progress: Progressing toward goal  Visit Information  Last PT Received On: 07/16/12    Subjective Data  Subjective: Reports now off Meclizine and nausea medication.  Having more dizziness today and really worse this week.  But thinking more clearly (states MD thought medication causing confusion)   Cognition  Cognition Overall Cognitive Status: Appears within functional limits for tasks assessed/performed Arousal/Alertness: Awake/alert Orientation Level: Appears intact for tasks assessed Behavior During Session: [facility] for tasks  performed    Balance  Dynamic Standing Balance  Dynamic Standing - Balance Support: Bilateral upper extremity supported Dynamic Standing - Level of Assistance: 5: Stand by assistance Dynamic Standing - Comments: during standing VOR exercises  End of Session PT - End of Session Equipment Utilized During Treatment: Gait belt Activity Tolerance: Patient limited by fatigue Patient left: in chair;with call bell/phone within reach   GP     [facility] 07/16/2012, 5:41 PM [name], [name] 07/16/2012

## 2012-07-16 NOTE — Procedures (Signed)
HISTORY:  A 67 year old male with altered mental status, and difficulty with gait.  MEDICATIONS:  Tylenol, Xanax, vitamin D, Prozac, Neurontin, Dilaudid, Claritin, Zofran, Deltasone, Spiriva, Desyrel.  CONDITIONS OF RECORDING:  This is a 16-channel EEG carried out with the patient in the awake and drowsy states.  DESCRIPTION:  The waking background activity consists of a low-voltage, symmetrical, fairly well-organized 9 hertz alpha activity seen from the parieto-occipital and posterotemporal regions.  Low-voltage, fast activity, poorly organized is seen anteriorly and at times, superimposed on more posterior rhythms.  A mixture of theta and alpha activity was seen from the central and temporal regions.  The patient drowses with slowing to irregular, low-voltage theta and beta activity.  Stage II sleep is not obtained.  Hyperventilation was not performed.  Intermittent photic stimulation failed to elicit any change in the tracing.  IMPRESSION:  This is a normal EEG.          ______________________________ Alexis Goodell, MD    PZ:PSUG D:  07/16/2012 07:06:54  T:  07/16/2012 07:19:51  Job #:  [id number]

## 2012-07-16 NOTE — Progress Notes (Signed)
Subjective:  Awake and alert. LPs were tough. He has a minor headache. Pain is controlled now that he is back on his home regimen  Objective: Lab: Lab Results  Component Value Date   WBC 6.2 07/14/2012   HGB 14.3 07/14/2012   HCT 40.5 07/14/2012   MCV 87.1 07/14/2012   PLT 176 07/14/2012   BMET    Component Value Date/Time   NA 134* 07/14/2012 1618   K 3.3* 07/14/2012 1618   CL 89* 07/14/2012 1618   CO2 37* 07/14/2012 1618   GLUCOSE 84 07/14/2012 1618   BUN 14 07/14/2012 1618   CREATININE 0.86 07/14/2012 1618   CALCIUM 9.5 07/14/2012 1618   GFRNONAA 89* 07/14/2012 1618   GFRAA >90 07/14/2012 1618   Body fluid/spinal fluid: Glucose 56, protein 142, RBC 58, WBC 1; colorless, hazy; culture pending; gram stain - no organisms, monocytes  Imaging:  Scheduled Meds: . sodium chloride   Intravenous Daily  . cholecalciferol  1,000 Units Oral Daily  . feeding supplement  237 mL Oral BID BM  . FLUoxetine  80 mg Oral Daily  . gabapentin  300 mg Oral TID  . OxyCODONE  20 mg Oral Q12H  . predniSONE  5 mg Oral Q breakfast  . sodium chloride  3 mL Intravenous Q12H  . tiotropium  18 mcg Inhalation Daily  . traZODone  100 mg Oral QHS   Continuous Infusions: . sodium chloride 75 mL/hr at 07/15/12 1000   PRN Meds:.acetaminophen, acetaminophen, ALPRAZolam, HYDROmorphone, ondansetron (ZOFRAN) IV, ondansetron   Physical Exam: Filed Vitals:   07/15/12 2205  BP: 139/78  Pulse: 74  Temp: 99 F (37.2 C)  Resp: 20   Gen'l - WNWD white man Neuro - A&O x 3, speech is clear, cognition is normal     Assessment/Plan: Neuro - mental status appears to be clear. Remains ataxic.   Plan Await neurology assessment today.  Hopefull home this PM - will benefit from neuro-rehab and outpatient PT.   Adella Hare [place] [facility] (o) [phone]; (c) [place]: [phone]  07/16/2012, 6:32 AM

## 2012-07-16 NOTE — Progress Notes (Signed)
Subjective: Patient reports that his dizziness is worse today and therefore overall he feels that he is doing poorly.  LP was performed on yesterday and shows an elevated protein at 142 and a normal glucose at 56.  One wbc was noted with a rbc count of 58.  Infectious likelihood is low.  Remaining fluid studies are pending.    Objective: Current vital signs: BP 129/71  Pulse 78  Temp(Src) 99.1 F (37.3 C) (Oral)  Resp 20  Ht 5' 5"  (1.651 m)  Wt 68.04 kg (150 lb)  BMI 24.96 kg/m2  SpO2 95% Vital signs in last 24 hours: Temp:  [97.4 F (36.3 C)-99.1 F (37.3 C)] 99.1 F (37.3 C) (04/10 1040) Pulse Rate:  [74-79] 78 (04/10 1040) Resp:  [18-22] 20 (04/10 1040) BP: (128-139)/(68-81) 129/71 mmHg (04/10 1040) SpO2:  [90 %-96 %] 95 % (04/10 1040) Weight:  [68.04 kg (150 lb)] 68.04 kg (150 lb) (04/10 0624)  Intake/Output from previous day:   Intake/Output this shift: Total I/O In: 420 [P.O.:420] Out: -  Nutritional status: General  Neurologic Exam: Mental Status: Alert, oriented, thought content appropriate.  Speech fluent without evidence of aphasia.  Able to follow 3 step commands without difficulty. Spells WORLD backward.  Able to count backward by 5's and 7's from 100. Cranial Nerves: II: Discs flat bilaterally; Visual fields grossly normal, pupils equal, round, reactive to light and accommodation III,IV, VI: ptosis not present, extra-ocular motions intact bilaterally V,VII: smile symmetric, facial light touch sensation normal bilaterally VIII: hearing normal bilaterally IX,X: gag reflex present XI: bilateral shoulder shrug XII: midline tongue extension Motor: Right : Upper extremity   5/5    Left:     Upper extremity   5/5  Lower extremity   5/5     Lower extremity   5/5 Tone and bulk:normal tone throughout; no atrophy noted Sensory: Pinprick and light touch intact throughout, bilaterally Deep Tendon Reflexes: 2+ and symmetric throughout Cerebellar: normal  finger-to-nose   Lab Results: Basic Metabolic Panel:  Recent Labs Lab 07/14/12 1618  NA 134*  K 3.3*  CL 89*  CO2 37*  GLUCOSE 84  BUN 14  CREATININE 0.86  CALCIUM 9.5    Liver Function Tests:  Recent Labs Lab 07/14/12 1618  AST 21  ALT 25  ALKPHOS 69  BILITOT 0.7  PROT 6.8  ALBUMIN 3.7   No results found for this basename: LIPASE, AMYLASE,  in the last 168 hours  Recent Labs Lab 07/14/12 1619  AMMONIA 22    CBC:  Recent Labs Lab 07/14/12 1618  WBC 6.2  HGB 14.3  HCT 40.5  MCV 87.1  PLT 176    Cardiac Enzymes: No results found for this basename: CKTOTAL, CKMB, CKMBINDEX, TROPONINI,  in the last 168 hours  Lipid Panel: No results found for this basename: CHOL, TRIG, HDL, CHOLHDL, VLDL, LDLCALC,  in the last 168 hours  CBG: No results found for this basename: GLUCAP,  in the last 168 hours  Microbiology: Results for orders placed during the hospital encounter of 07/14/12  CSF CULTURE     Status: None   Collection Time    07/15/12  3:00 PM      Result Value Range Status   Specimen Description CSF   Final   Special Requests 4.0ML FLUID   Final   Gram Stain     Final   Value: WBC PRESENT, PREDOMINANTLY MONONUCLEAR     NO ORGANISMS SEEN     CYTOSPIN Performed at  [facility]   Culture NO GROWTH 1 DAY   Final   Report Status PENDING   Incomplete  FUNGUS CULTURE W SMEAR     Status: None   Collection Time    07/15/12  3:00 PM      Result Value Range Status   Specimen Description CSF   Final   Special Requests 4.0ML   Final   Fungal Smear NO YEAST OR FUNGAL ELEMENTS SEEN   Final   Culture CULTURE IN PROGRESS FOR FOUR WEEKS   Final   Report Status PENDING   Incomplete  GRAM STAIN     Status: None   Collection Time    07/15/12  3:00 PM      Result Value Range Status   Specimen Description CSF   Final   Special Requests 4.0ML FLUID   Final   Gram Stain     Final   Value: CYTOSPIN PREP     WBC PRESENT, PREDOMINANTLY MONONUCLEAR      NO ORGANISMS SEEN   Report Status 07/15/2012 FINAL   Final    Coagulation Studies: No results found for this basename: LABPROT, INR,  in the last 72 hours  Imaging: Dg Chest 2 View  07/14/2012  *RADIOLOGY REPORT*  Clinical Data: Confusion, dizziness  CHEST - 2 VIEW  Comparison: 09/04/2011  Findings: Chronic right hemidiaphragm elevation.  Streaky bibasilar opacities persist compatible with atelectasis, slightly worse in the left lower lobe.  No effusion or pneumothorax.  Negative for edema.  Stable heart size.  Bilateral shoulder arthroplasties evident.  IMPRESSION: Chronic right hemidiaphragm elevation with increased basilar atelectasis.   Original Report Authenticated By: Jerilynn Mages. Annamaria Boots, M.D.    Ct Head Wo Contrast  07/14/2012  *RADIOLOGY REPORT*  Clinical Data: Altered mental status.  CT HEAD WITHOUT CONTRAST  Technique:  Contiguous axial images were obtained from the base of the skull through the vertex without contrast.  Comparison: CT of the head performed 06/23/2012, and MRI of the brain performed 06/30/2012  Findings: There is no evidence of acute infarction, mass lesion, or intra- or extra-axial hemorrhage on CT.  Mild periventricular white matter change likely reflects small vessel ischemic microangiopathy.  The posterior fossa, including the cerebellum, brainstem and fourth ventricle, is within normal limits.  The third and lateral ventricles, and basal ganglia are unremarkable in appearance.  The cerebral hemispheres are symmetric in appearance, with normal gray- white differentiation.  No mass effect or midline shift is seen.  There is no evidence of fracture; visualized osseous structures are unremarkable in appearance.  The visualized portions of the orbits are within normal limits.  The paranasal sinuses and mastoid air cells are well-aerated.  No significant soft tissue abnormalities are seen.  IMPRESSION:  1.  No acute intracranial pathology seen on CT. 2.  Mild periventricular white matter  change likely reflects small vessel ischemic microangiopathy.   Original Report Authenticated By: Santa Lighter, M.D.    Dg Fluoro Guide Lumbar Puncture  07/15/2012  Vinnie Langton, MD     07/15/2012  2:59 PM Fluroscopic guide lumbar puncture performed at L4 (patient status  post L4-L5 PLIF and laminectomy).  11 mL clear CSF obtained.  No  complications.  See PACS for full dictation.      Medications:  I have reviewed the patient's current medications. Scheduled: . sodium chloride   Intravenous Daily  . cholecalciferol  1,000 Units Oral Daily  . feeding supplement  237 mL Oral BID BM  . FLUoxetine  80  mg Oral Daily  . gabapentin  300 mg Oral TID  . OxyCODONE  20 mg Oral Q12H  . predniSONE  5 mg Oral Q breakfast  . sodium chloride  3 mL Intravenous Q12H  . tiotropium  18 mcg Inhalation Daily  . traZODone  100 mg Oral QHS    Assessment/Plan: Confusion clearing and may very well have been secondary to medications.  Patient is now dizzy.  Likely peripheral.  Would not reconsider previous medications for dizziness  Recommendations: 1.  Agree with vestibular exercises 2.  Patient may use low dose Valium as needed for symptomatic relief.     LOS: 2 days   Alexis Goodell, MD [facility] [phone] 07/16/2012  12:23 PM

## 2012-07-16 NOTE — Progress Notes (Signed)
Subjective: Reviewed neurology notes and EEG report. No acute findings. No further testing proposed.  Objective: Lab: Lab Results  Component Value Date   WBC 6.2 07/14/2012   HGB 14.3 07/14/2012   HCT 40.5 07/14/2012   MCV 87.1 07/14/2012   PLT 176 07/14/2012   BMET    Component Value Date/Time   NA 134* 07/14/2012 1618   K 3.3* 07/14/2012 1618   CL 89* 07/14/2012 1618   CO2 37* 07/14/2012 1618   GLUCOSE 84 07/14/2012 1618   BUN 14 07/14/2012 1618   CREATININE 0.86 07/14/2012 1618   CALCIUM 9.5 07/14/2012 1618   GFRNONAA 89* 07/14/2012 1618   GFRAA >90 07/14/2012 1618     Imaging:  Scheduled Meds: . sodium chloride   Intravenous Daily  . cholecalciferol  1,000 Units Oral Daily  . feeding supplement  237 mL Oral BID BM  . FLUoxetine  80 mg Oral Daily  . gabapentin  300 mg Oral TID  . OxyCODONE  20 mg Oral Q12H  . predniSONE  5 mg Oral Q breakfast  . sodium chloride  3 mL Intravenous Q12H  . tiotropium  18 mcg Inhalation Daily  . traZODone  100 mg Oral QHS   Continuous Infusions: . sodium chloride 75 mL/hr at 07/15/12 1000   PRN Meds:.acetaminophen, acetaminophen, ALPRAZolam, HYDROmorphone, ondansetron (ZOFRAN) IV, ondansetron   Physical Exam: Filed Vitals:   07/16/12 1853  BP: 134/83  Pulse: 75  Temp: 99.2 F (37.3 C)  Resp: 19        Assessment/Plan: For discharge - neurorehab as outpatient Dictated [phone]   Adella Hare [place] [facility] (o) [phone]; (c) [place]: [phone]  07/16/2012, 7:03 PM

## 2012-07-17 LAB — VDRL, CSF: VDRL Quant, CSF: NONREACTIVE

## 2012-07-17 NOTE — Discharge Summary (Signed)
NAMEHINES, KLOSS            ACCOUNT NO.:  [id number]  MEDICAL RECORD NO.:  [id number]  LOCATION:  [id number]                        FACILITY:  [facility]  PHYSICIAN:  Heinz Knuckles. Norins, MD  DATE OF BIRTH:  05/31/1945  DATE OF ADMISSION:  07/14/2012 DATE OF DISCHARGE:  07/16/2012                              DISCHARGE SUMMARY   ADMITTING DIAGNOSES: 1. Encephalopathy with gait disturbance. 2. Chronic pain syndrome. 3. Depression and anxiety. 4. History of atrial fibrillation. 5. Hyponatremia. 6. Hypokalemia.  DISCHARGE DIAGNOSES: 1. Vestibular disease with ataxia, 2. Chronic pain syndrome. 3. Depression and anxiety. 4. History of atrial fibrillation. 5. Hyponatremia. 6. Hypokalemia.  CONSULTANTS:  Dr. Doy Mince for hospital-based neurology.  IMAGING: 1. CT of the head without contrast, read as showing no acute     intracranial pathology.  mild periventricular white matter changes     likely reflects small vessel ischemic microangiopathy. 2. Chest x-ray, 2 view, which showed chronic right hemidiaphragm     elevation with increased basilar atelectasis.   3. The patient did have     an MRI scan prior to admission June 30, 2012, which was read as     mild chronic microvascular disease.  No acute infarct or mass.  PROCEDURES: 1. The patient had a lumbar puncture under fluoro. 2. The patient had an EEG read out by Dr. Doy Mince as a normal EEG     study with no abnormalities appreciated.  HISTORY OF PRESENT ILLNESS:  [name] is a 67 year old gentleman with hemochromatosis, hypertension, COPD, rheumatoid arthritis.  He has had multiple orthopedic surgeries.  As a result of the above, he has chronic pain syndrome.  This has been managed with chronic opioid use, under good control.  The patient was referred for direct admission by Dr. Rexene Alberts, who is at [facility] for evaluation of acute mental status changes and worsening gait.  The patient has had a problem for many  weeks now with ataxia, thought to be labyrinthitis/vestibular apparatus disease. He was treated with both scopolamine and meclizine.  He had had an outpatient CT of the brain, which was unremarkable.  Outpatient MRI of the brain, which was unremarkable.  He was referred to Dr. Rexene Alberts, neurologist who found the patient confused.  The patient just returned from a trip to the [place].  His significant other confirmed that he had confusion for 48 hours.  The patient has had no fever.  He does relate having some blurry vision.  He does relate significant dizziness particularly with head movement.  He has not been able to drive due to his dizziness.  The patient denies any chest pain, dyspnea, cough, abdominal pain.  Because of his mental status changes, he is admitted for further evaluation.  Please see the H and P for past medical history, family history, social history, and admission examination.  HOSPITAL COURSE:  The patient was seen by Neurology at admission and followed by Neurology through this admission.  He had imaging studies as above, which were unremarkable.  An EEG, which was unremarkable.  Lumbar puncture, which was negative for any signs of infection.  Studies for subtle protein/immunologic related disease were pending at the time of discharge dictation.  The  patient had PT and OT evaluations and it was felt that he did need to have some physical therapy, neuro-rehabilitation.  Neurology had no additional studies to propose, and did not find any other underlying neurologic disease.  Working hypothesis is that the patient had mental status changes, somnolence, slurred speech, and increasing ataxia secondary to affects of scopolamine and meclizine.  With the patient having completed a full diagnostic evaluation without any significant findings with no additional inpatient therapies or treatments needed at this time, he is thought to be stable and ready for discharge to home.  Of  note, the patient's mental status has returned to baseline and he feels that his overall condition is back to baseline except for his persistent ataxia.  The patient's other medical problems as outlined in the admission note was corrected.  He does have a mildly depressed potassium and will continue potassium supplementation.  Sodium was improved to 134.  The patient's pain has been well controlled with his home regimen.  DISCHARGE EXAMINATION:  VITAL SIGNS:  The patient is afebrile at 99.2, blood pressure 134/83, pulse 75, respirations 19, O2 sats 98% on room air. GENERAL APPEARANCE:  This is a well-nourished, sunburned gentleman in no acute distress.  He is bright and alert and talkative. HEENT:  Conjunctiva and sclerae were clear.  Oropharynx without lesions. NECK:  Supple. CARDIOVASCULAR:  Radial pulses 2+.  Precordium is quiet.  He has a regular rate and rhythm. PULMONARY:  The patient has normal respirations. ABDOMEN:  Soft.  No guarding or rebound. NEURO:  The patient is awake, alert.  He is oriented to person, place, time, and context.  Cognitive function is normal.  No evidence of somnolence.  Speech is very clear.  Cranial nerves II through XII revealed normal facial symmetry and extraocular muscle movement. Cerebellar:  The patient has no tremor.  He has no cogwheeling.  He was not stood or ambulated, this will be done by Physical therapy.  He was thought to be able to manage at home with minimal assistance.  FINAL LABORATORY DATA:  Metabolic panel from July 14, 2012, with sodium 134, potassium 3.3, chloride of 89, CO2 of 37, BUN 14, creatinine 0.86, glucose was 84.  Liver functions were normal.  B12 was 655.  CBC with a white count of 6,200, hemoglobin of 14.3 g, platelet count 176,000.  Sed rate on June 22, 2012, was normal at 4.  TSH was normal at 0.84. Spinal fluid with a glucose of 56, total protein of 142, RBC count was 58, white count was 1, appearance was hazy,  CSF was colorless.  CSF cultures are pending at the time of discharge dictation with no growth to date.  No organisms were seen on microscopic exam.  Fungal smear was negative for yeast or fungus.  Cultures were pending.  PT and OT recommendations were reviewed.  The patient will be referred for neuro rehab for vestibular training.  DISCHARGE MEDICATIONS: 1. Tudorza 400 mcg 1 inhalation to lungs b.i.d. 2. Humira 40 mg per 0.8 mL injection therapy as an outpatient will     resume. 3. Alprazolam XR 1 mg daily. 4. Alprazolam 0.5 mg one 3 times daily as needed for sleep or anxiety. 5. Aspirin 81 mg daily. 6. Celebrex 200 mg b.i.d. 7. Vitamin D 1000 units daily. 8. Colchicine 0.6 mg 2 times daily. 9. Fluoxetine 40 mg daily. 10.Fluticasone nasal spray 2 sprays each nostril daily. 11.Furosemide 20 mg daily. 12.Gabapentin 300 mg 2 every a.m., 1 at  noon, and 2 at bedtime. 13.Hydrochlorothiazide 25 mg daily. 14.Dilaudid 2 mg every 4 hours as needed for pain. 15.Losartan 50 mg daily. 16.Meclizine will be stopped. 17.Melatonin 5 mg at bedtime. 18.OxyContin 20 mg q.12. 19.Protonix 40 mg b.i.d. 20.Systane ultra eye drops 1 drop each eye daily. 21.Potassium 20 mEq b.i.d. 22.The patient will resume taking prednisone burst and taper as     prescribed as an outpatient and then after completing that taper he     will take 5 mg daily, which is a standard regimen. 23.Scopolamine patch will be stopped. 24.Zocor 40 mg daily. 25.Trazodone 100 mg at bedtime.  DISPOSITION:  The patient to be discharged to home.  His significant other will provide transportation.  The patient will be notified by the office in regard to neuro rehab appointment.  The patient's condition at the time of discharge dictation is stable and improved.     Heinz Knuckles Norins, MD     MEN/MEDQ  D:  07/16/2012  T:  07/17/2012  Job:  [id number]  cc:   Dr. Rexene Alberts Dr. Doy Mince

## 2012-07-18 LAB — CSF CULTURE W GRAM STAIN

## 2012-07-18 LAB — CSF CULTURE: Culture: NO GROWTH

## 2012-07-19 LAB — CSF IGG: IgG, CSF: 12.3 mg/dL — ABNORMAL HIGH (ref 0.8–7.7)

## 2012-07-20 ENCOUNTER — Inpatient Hospital Stay (HOSPITAL_COMMUNITY): Admission: RE | Admit: 2012-07-20 | Payer: Medicare Other | Source: Ambulatory Visit

## 2012-07-20 LAB — CMV (CYTOMEGALOVIRUS) DNA ULTRAQUANT, PCR: CMV DNA Quant: <200 copies/mL (ref <200)

## 2012-07-21 LAB — OLIGOCLONAL BANDS, CSF + SERM

## 2012-07-27 ENCOUNTER — Telehealth: Payer: Self-pay | Admitting: Internal Medicine

## 2012-07-27 ENCOUNTER — Other Ambulatory Visit: Payer: Self-pay | Admitting: Internal Medicine

## 2012-07-27 NOTE — Telephone Encounter (Signed)
Pt needs refills on dilaudid, for April and may,  He does already have the oxycontin for April and May

## 2012-07-28 MED ORDER — HYDROMORPHONE HCL 2 MG PO TABS: 2.0000 mg | ORAL_TABLET | ORAL | Status: DC | PRN

## 2012-07-28 NOTE — Telephone Encounter (Signed)
Called the patient informed to pickup hardcopy at the front desk. 

## 2012-07-28 NOTE — Telephone Encounter (Signed)
Done hardcopy to robin  

## 2012-07-29 ENCOUNTER — Other Ambulatory Visit: Payer: Self-pay

## 2012-07-29 MED ORDER — ROFLUMILAST 500 MCG PO TABS: 500.0000 ug | ORAL_TABLET | Freq: Every day | ORAL | Status: DC

## 2012-07-29 MED ORDER — PANTOPRAZOLE SODIUM 40 MG PO TBEC: 40.0000 mg | DELAYED_RELEASE_TABLET | Freq: Two times a day (BID) | ORAL | Status: DC

## 2012-07-29 NOTE — Telephone Encounter (Signed)
Refill for Daliresp 500 mcg faxed from [facility] on [address]. Did not see this on pt med list. Please advise.

## 2012-07-30 ENCOUNTER — Telehealth: Payer: Self-pay | Admitting: Internal Medicine

## 2012-07-30 NOTE — Telephone Encounter (Signed)
yes

## 2012-07-30 NOTE — Telephone Encounter (Signed)
Pt was in the hospital in early April.  He had a spinal tap while there.  He is to have a dental crown on Monday April 28.  He wants to know if it is OK to go ahead with this.

## 2012-07-31 NOTE — Telephone Encounter (Signed)
LMOM with details and msg to call if he has questions.

## 2012-08-05 ENCOUNTER — Ambulatory Visit: Payer: Medicare Other | Attending: Internal Medicine | Admitting: Physical Therapy

## 2012-08-05 DIAGNOSIS — H8110 Benign paroxysmal vertigo, unspecified ear: Secondary | ICD-10-CM | POA: Insufficient documentation

## 2012-08-05 DIAGNOSIS — R269 Unspecified abnormalities of gait and mobility: Secondary | ICD-10-CM | POA: Insufficient documentation

## 2012-08-05 DIAGNOSIS — IMO0001 Reserved for inherently not codable concepts without codable children: Secondary | ICD-10-CM | POA: Insufficient documentation

## 2012-08-06 ENCOUNTER — Encounter: Payer: Self-pay | Admitting: Internal Medicine

## 2012-08-06 ENCOUNTER — Ambulatory Visit: Payer: Medicare Other | Admitting: Internal Medicine

## 2012-08-06 ENCOUNTER — Ambulatory Visit (INDEPENDENT_AMBULATORY_CARE_PROVIDER_SITE_OTHER): Payer: Medicare Other | Admitting: Internal Medicine

## 2012-08-06 VITALS — BP 138/82 | HR 88 | Temp 97.8°F | Ht 65.0 in | Wt 154.0 lb

## 2012-08-06 DIAGNOSIS — R109 Unspecified abdominal pain: Secondary | ICD-10-CM

## 2012-08-06 DIAGNOSIS — K439 Ventral hernia without obstruction or gangrene: Secondary | ICD-10-CM

## 2012-08-06 MED ORDER — ABDOMINAL BINDER/ELASTIC MED MISC: 1.0000 | Status: DC | PRN

## 2012-08-06 NOTE — Progress Notes (Signed)
  Subjective:    Patient ID: [name], male    DOB: Jun 24, 1945, 67 y.o.   MRN: [id number]  HPI  Patient is here today because he has a large bulge on the right side of the abdomen.  He noticed this two weeks ago.  He states that this bulge is slightly painful.  Pain is constant, 2/10, and non-radiating.  Has not tried any relieving factors at home.  Physical activity seems to make it worse.  It seems to reduce slightly when he is laying down.  Has had a right and left inguinal hernia repair.  Right inguinal hernia repair may have had a mesh.  No changes in bowel movements.  Vertigo seems to be doing slightly better.  Feels that the exercises are helping but is still nauseous.    Review of Systems  Gastrointestinal: Positive for nausea and abdominal pain. Negative for vomiting, diarrhea, constipation and blood in stool.  Neurological: Positive for dizziness.  All other systems reviewed and are negative.       Objective:   Physical Exam  Nursing note and vitals reviewed. Constitutional: He is oriented to person, place, and time. He appears well-developed and well-nourished. No distress.  HENT:  Head: Normocephalic and atraumatic.  Eyes: Conjunctivae are normal. No scleral icterus.  Neck: Normal range of motion. Neck supple.  Cardiovascular: Normal rate, regular rhythm and normal heart sounds.  Exam reveals no gallop and no friction rub.   No murmur heard. Pulmonary/Chest: Effort normal and breath sounds normal. No respiratory distress. He has no wheezes. He has no rales. He exhibits no tenderness.  Abdominal: Soft. Bowel sounds are normal. He exhibits no distension. There is no tenderness. There is no rebound and no guarding. A hernia is present. Hernia confirmed positive in the ventral area.    Neurological: He is alert and oriented to person, place, and time.  Skin: Skin is warm and dry.  Psychiatric: He has a normal mood and affect. His behavior is normal.            Assessment & Plan:  Patient here for new onset of abdominal bulge.    Based on physical exam and history this "bulge" is likely a ventral hernia.  Patient is to have a CT of the abdomen, will be referred to general surgery, and was prescribed a binder for comfort.   Patient instructed on strangulated hernia, and given strict return precautions.

## 2012-08-06 NOTE — Patient Instructions (Signed)
Hernia A hernia occurs when an internal organ pushes out through a weak spot in the abdominal wall. Hernias most commonly occur in the groin and around the navel. Hernias often can be pushed back into place (reduced). Most hernias tend to get worse over time. Some abdominal hernias can get stuck in the opening (irreducible or incarcerated hernia) and cannot be reduced. An irreducible abdominal hernia which is tightly squeezed into the opening is at risk for impaired blood supply (strangulated hernia). A strangulated hernia is a medical emergency. Because of the risk for an irreducible or strangulated hernia, surgery may be recommended to repair a hernia. CAUSES   Heavy lifting.  Prolonged coughing.  Straining to have a bowel movement.  A cut (incision) made during an abdominal surgery. HOME CARE INSTRUCTIONS   Bed rest is not required. You may continue your normal activities.  Avoid lifting more than 10 pounds (4.5 kg) or straining.  Cough gently. If you are a smoker it is best to stop. Even the best hernia repair can break down with the continual strain of coughing. Even if you do not have your hernia repaired, a cough will continue to aggravate the problem.  Do not wear anything tight over your hernia. Do not try to keep it in with an outside bandage or truss. These can damage abdominal contents if they are trapped within the hernia sac.  Eat a normal diet.  Avoid constipation. Straining over long periods of time will increase hernia size and encourage breakdown of repairs. If you cannot do this with diet alone, stool softeners may be used. SEEK IMMEDIATE MEDICAL CARE IF:   You have a fever.  You develop increasing abdominal pain.  You feel nauseous or vomit.  Your hernia is stuck outside the abdomen, looks discolored, feels hard, or is tender.  You have any changes in your bowel habits or in the hernia that are unusual for you.  You have increased pain or swelling around the  hernia.  You cannot push the hernia back in place by applying gentle pressure while lying down. MAKE SURE YOU:   Understand these instructions.  Will watch your condition.  Will get help right away if you are not doing well or get worse. Document Released: 03/25/2005 Document Revised: 06/17/2011 Document Reviewed: 11/12/2007 Exit[employer] Patient Information 2013 [employer].  

## 2012-08-06 NOTE — Progress Notes (Signed)
Subjective:    Patient ID: [name], male    DOB: Jun 14, 1945, 67 y.o.   MRN: [id number]  HPI  Pt presents to the [facility] today with c/o right sided abdominal pain. This started 2 weeks ago. He has had bilateral inguinal hernias in the past. He thinks he feels a bulge in his side. It seems to be worse when he strains, cough or sneezes. The pain is a 5/10. He denies nausea and vomiting. The bulge does not stay out. He denies fever or chills. He is having normal bowel movements.  Review of Systems      Past Medical History  Diagnosis Date  . Hx of colonic polyps   . Diverticulosis   . Hypertension   . Osteoarthritis   . 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 CAD (coronary artery disease)     minimal coronary plaque in the LAD and right coronary system. 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 7-06. Last nuclear stress 11-17-06 fixed anterior/ inferior defect, no inducible ischemia, EF 81%  . Allergic rhinitis   . Hx of colonoscopy   . RA (rheumatoid arthritis)   . COPD (chronic obstructive pulmonary disease)   . PONV (postoperative nausea and vomiting)   . Dysrhythmia 01-24-12    past hx. A.Fib x1 episode-responded to med.    Current Outpatient Prescriptions  Medication Sig Dispense Refill  . Aclidinium Bromide (TUDORZA PRESSAIR) 400 MCG/ACT AEPB Inhale 1 puff into the lungs 2 (two) times daily.  1 each  11  . adalimumab (HUMIRA) 40 MG/0.8ML injection Inject 40 mg into the skin every 14 (fourteen) days.      . ALPRAZolam (XANAX XR) 1 MG 24 hr tablet Take 1 tablet (1 mg total) by mouth every morning.  30 tablet  5  . ALPRAZolam (XANAX) 0.5 MG tablet Take 1 tablet (0.5 mg total) by mouth 3 (three) times daily as needed for sleep or anxiety.  90 tablet  5  . aspirin (ASPIRIN EC) 81 MG EC tablet Take 81 mg by mouth every morning.       . celecoxib (CELEBREX) 200 MG  capsule Take 200 mg by mouth 2 (two) times daily.      . cholecalciferol (VITAMIN D) 1000 UNITS tablet Take 1 tablet (1,000 Units total) by mouth daily.  100 tablet  3  . colchicine 0.6 MG tablet Take 1 tablet (0.6 mg total) by mouth 2 (two) times daily.  60 tablet  0  . FLUoxetine (PROZAC) 40 MG capsule Take 2 capsules (80 mg total) by mouth daily.  90 capsule  3  . fluticasone (FLONASE) 50 MCG/ACT nasal spray Place 2 sprays into the nose daily.      . furosemide (LASIX) 20 MG tablet Take 20 mg by mouth every morning.      . gabapentin (NEURONTIN) 300 MG capsule Take 300 mg by mouth 3 (three) times daily. 2 every am, 1 at noon, 2 at bedtime, 1 at 1:00 am.      . hydrochlorothiazide (HYDRODIURIL) 25 MG tablet Take 25 mg by mouth every morning.      [name] HYDROmorphone (DILAUDID) 2 MG tablet Take 1 tablet (2 mg total) by mouth every 4 (four) hours as needed for pain. May take up to 2 tablets if needed. Fill on or after 09/01/12.  60 tablet  0  . losartan (COZAAR) 50  MG tablet TAKE 1 TABLET BY MOUTH ONCE A DAY  90 tablet  0  . Melatonin ([facility] MELATONIN) 5 MG TABS Take 5 mg by mouth at bedtime.       [name] oxyCODONE (OXYCONTIN) 20 MG 12 hr tablet Take 1 tablet (20 mg total) by mouth every 12 (twelve) hours. FILL on or after 09/01/12.  60 tablet  0  . pantoprazole (PROTONIX) 40 MG tablet Take 1 tablet (40 mg total) by mouth 2 (two) times daily.  60 tablet  5  . Polyethyl Glycol-Propyl Glycol (SYSTANE ULTRA) 0.4-0.3 % SOLN Apply 1 drop to eye daily.      . potassium chloride SA (K-DUR,KLOR-CON) 20 MEQ tablet Take 1 tablet (20 mEq total) by mouth 2 (two) times daily.  60 tablet  3  . predniSONE (DELTASONE) 10 MG tablet Prednisone 10 mg: take 4 tabs a day x 3 days; then 3 tabs a day x 4 days; then 2 tabs a day x 4 days, then 1 tab a day x 6 days, then go back to 5 mg/d. Take pc.  38 tablet  0  . predniSONE (DELTASONE) 5 MG tablet Take 5 mg by mouth daily.      . roflumilast (DALIRESP) 500 MCG TABS tablet Take 1  tablet (500 mcg total) by mouth daily.  30 tablet  5  . simvastatin (ZOCOR) 40 MG tablet Take 40 mg by mouth every 3 (three) days. Tue, thu , sat      . traZODone (DESYREL) 100 MG tablet Take 1 tablet (100 mg total) by mouth at bedtime.  30 tablet  11   No current facility-administered medications for this visit.   Facility-Administered Medications Ordered in Other Visits  Medication Dose Route Frequency Provider Last Rate Last Dose  . loratadine (CLARITIN) tablet 10 mg  10 mg Oral Daily Hennie Duos, MD        Allergies  Allergen Reactions  . Penicillins Hives, Shortness Of Breath and Rash    REACTION: hives, breathing problems  . Morphine And Related Nausea And Vomiting and Swelling    Family History  Problem Relation Age of Onset  . Uterine cancer Mother     survivor  . Macular degeneration Mother   . Other Mother     ankle edema  . Lung cancer Mother   . Coronary artery disease Father   . Hypertension Father   . Prostate cancer Father   . Colon polyps Father   . Thyroid disease Sister   . Heart attack Brother   . Hyperlipidemia Brother   . Other Brother     Schizophrenic  . Diabetes Neg Hx   . Colon cancer Neg Hx   . Esophageal cancer Neg Hx   . Rectal cancer Neg Hx   . Stomach cancer Neg Hx   . Coronary artery disease Maternal Aunt   . Heart attack Maternal Aunt   . Rheum arthritis Sister   . Hemochromatosis Sister     History   Social History  . Marital Status: Married    Spouse Name: N/A    Number of Children: 2  . Years of Education: 16   Occupational History  . 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 Smoking status: Never Smoker   . Smokeless tobacco: Never Used  . Alcohol Use: No  . Drug Use: No  . Sexually Active: Yes -- Male partner(s)   Other Topics Concern  .  Not on file   Social History Narrative   [employer] [place]. Married 1972-58yr seperated/divorced. Engaged April '11. 1 son '74; step-daughter '70.  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 During '11 discovered an [employer]- $500,000+ loss. Is handling this OK - will keep the business running.     Constitutional: Denies fever, malaise, fatigue, headache or abrupt weight changes.  Respiratory: Denies difficulty breathing, shortness of breath, cough or sputum production.   Cardiovascular: Denies chest pain, chest tightness, palpitations or swelling in the hands or feet.  Gastrointestinal: Pt reports bulge in right side. Denies abdominal pain, bloating, constipation, diarrhea or blood in the stool.    No other specific complaints in a complete review of systems (except as listed in HPI above).  Objective:   Physical Exam    BP 138/82  Pulse 88  Temp(Src) 97.8 F (36.6 C) (Oral)  Ht 5' 5"  (1.651 m)  Wt 154 lb (69.854 kg)  BMI 25.63 kg/m2  SpO2 93% Wt Readings from Last 3 Encounters:  08/06/12 154 lb (69.854 kg)  07/16/12 150 lb (68.04 kg)  07/14/12 154 lb (69.854 kg)    General: Appears her stated age, well developed, well nourished in NAD. Cardiovascular: Normal rate and rhythm. S1,S2 noted.  No murmur, rubs or gallops noted. No JVD or BLE edema. No carotid bruits noted. Pulmonary/Chest: Normal effort and positive vesicular breath sounds. No respiratory distress. No wheezes, rales or ronchi noted.  Abdomen: Soft and nontender. Normal bowel sounds, no bruits noted. No distention noted. Liver, spleen and kidneys non palpable. Hernia noted on right side of abdomen- moderate size. Musculoskeletal: Normal range of motion. No signs of joint swelling. No difficulty with gait.  Neurological: Alert and oriented. Cranial nerves II-XII intact. Coordination normal. +DTRs bilaterally. Psychiatric: Mood and affect normal. Behavior is normal. Judgment and thought content normal.         Assessment & Plan:   Right  side abdominal pain, likely due to abdominal wall hernia, new onset:  Will obtain CT scan of abdomen to assess hernia Emergent signs of strangulated hernia communicated to tpt- pt verbalizes understanding May need referral to general surgeon  RTC as needed or if symptoms persist or worsen

## 2012-08-07 ENCOUNTER — Ambulatory Visit: Payer: Medicare Other | Attending: Internal Medicine | Admitting: *Deleted

## 2012-08-07 ENCOUNTER — Telehealth: Payer: Self-pay | Admitting: Cardiovascular Disease

## 2012-08-07 DIAGNOSIS — IMO0001 Reserved for inherently not codable concepts without codable children: Secondary | ICD-10-CM | POA: Insufficient documentation

## 2012-08-07 DIAGNOSIS — H8110 Benign paroxysmal vertigo, unspecified ear: Secondary | ICD-10-CM | POA: Insufficient documentation

## 2012-08-07 DIAGNOSIS — R269 Unspecified abnormalities of gait and mobility: Secondary | ICD-10-CM | POA: Insufficient documentation

## 2012-08-07 NOTE — Telephone Encounter (Signed)
LMTCB ./CY 

## 2012-08-07 NOTE — Telephone Encounter (Signed)
New Problem:    Patient called in wanting to speak with you about a surgical procedure.  Please call back.

## 2012-08-11 ENCOUNTER — Ambulatory Visit (INDEPENDENT_AMBULATORY_CARE_PROVIDER_SITE_OTHER)
Admission: RE | Admit: 2012-08-11 | Discharge: 2012-08-11 | Disposition: A | Discharge status: Home / Self Care | Payer: Medicare Other | Source: Ambulatory Visit | Attending: Internal Medicine | Admitting: Internal Medicine

## 2012-08-11 DIAGNOSIS — K439 Ventral hernia without obstruction or gangrene: Secondary | ICD-10-CM

## 2012-08-11 DIAGNOSIS — R109 Unspecified abdominal pain: Secondary | ICD-10-CM

## 2012-08-11 NOTE — Telephone Encounter (Signed)
PT  NEEDING TO HAVE HERNIA REPAIR  WANTING TO KNOW WHO  YOU WOULD RECOMMEND .[name]

## 2012-08-11 NOTE — Telephone Encounter (Signed)
PT AWARE./CY

## 2012-08-11 NOTE — Telephone Encounter (Signed)
[name]

## 2012-08-12 ENCOUNTER — Ambulatory Visit: Payer: Medicare Other | Admitting: Physical Therapy

## 2012-08-13 LAB — FUNGUS CULTURE W SMEAR: Fungal Smear: NONE SEEN

## 2012-08-14 ENCOUNTER — Ambulatory Visit: Payer: Medicare Other | Admitting: Rehabilitative and Restorative Service Providers"

## 2012-08-17 ENCOUNTER — Other Ambulatory Visit: Payer: Self-pay | Admitting: Internal Medicine

## 2012-08-19 ENCOUNTER — Ambulatory Visit: Payer: Medicare Other | Admitting: *Deleted

## 2012-08-21 ENCOUNTER — Ambulatory Visit: Payer: Medicare Other | Admitting: *Deleted

## 2012-08-26 ENCOUNTER — Other Ambulatory Visit: Payer: Self-pay

## 2012-08-26 MED ORDER — COLCHICINE 0.6 MG PO TABS: 0.6000 mg | ORAL_TABLET | Freq: Two times a day (BID) | ORAL | Status: DC

## 2012-08-28 ENCOUNTER — Ambulatory Visit: Payer: Medicare Other | Admitting: Physical Therapy

## 2012-09-01 ENCOUNTER — Ambulatory Visit: Payer: Medicare Other | Admitting: Physical Therapy

## 2012-09-03 ENCOUNTER — Encounter (INDEPENDENT_AMBULATORY_CARE_PROVIDER_SITE_OTHER): Payer: Self-pay | Admitting: General Surgery

## 2012-09-03 ENCOUNTER — Ambulatory Visit (INDEPENDENT_AMBULATORY_CARE_PROVIDER_SITE_OTHER): Payer: MEDICARE | Admitting: General Surgery

## 2012-09-03 VITALS — BP 122/74 | HR 64 | Temp 97.7°F | Resp 14 | Ht 66.5 in | Wt 154.6 lb

## 2012-09-03 DIAGNOSIS — R198 Other specified symptoms and signs involving the digestive system and abdomen: Secondary | ICD-10-CM

## 2012-09-03 DIAGNOSIS — M629 Disorder of muscle, unspecified: Secondary | ICD-10-CM

## 2012-09-03 NOTE — Patient Instructions (Signed)
You have significant attenuation (thinning out) of the right abdominal wall. No evidence of hernia.

## 2012-09-03 NOTE — Progress Notes (Signed)
Patient ID: [name], male   DOB: Dec 08, 1945, 67 y.o.   MRN: [id number]  Chief Complaint  Patient presents with  . New Evaluation    eval RIH    HPI [name] is a 67 y.o. male.   HPI  He is referred by Webb Silversmith, NP for evaluation of an abdominal wall hernia.  He has lost some weight and has noted some bulging in the right lower and mid abdomen along with some pain in this area.  The pain worsens with straining, coughing, or sneezing. He has a little bit of constipation.  A CT scan was performed. It did not demonstrate any abdominal wall hernia. It did show a tiny recurrent right inguinal hernia only containing fat. He is here for evaluation of the asymmetric bulge.  Past Medical History  Diagnosis Date  . Hx of colonic polyps   . Diverticulosis   . Hypertension   . Osteoarthritis   . 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 CAD (coronary artery disease)     minimal coronary plaque in the LAD and right coronary system. 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 7-06. Last nuclear stress 11-17-06 fixed anterior/ inferior defect, no inducible ischemia, EF 81%  . Allergic rhinitis   . Hx of colonoscopy   . RA (rheumatoid arthritis)   . COPD (chronic obstructive pulmonary disease)   . PONV (postoperative nausea and vomiting)   . Dysrhythmia 01-24-12    past hx. A.Fib x1 episode-responded to med.    Past Surgical History  Procedure Laterality Date  . Appendectomy    . Inguinal hernia repair  2008    Right, left remotely  . Total knee arthroplasty      right knee partial 2002  . Tonsillectomy    . Shoulder preplacement  2008    left, partial  . Ptca w/ coated stent lad      Cx secondary to tear  . Foot surgery  11-08    for removal of bone spurs- right foot  . Reconstruction of ankle  6-09    Right- [facility]  . L4-5 diskectomy w/ fusion, cage placement  and rods  12-10    [place]  . Partial knee arthroplasty      left  . Total shoulder replacement      right  . Joint replacement    . Cardiac catheterization  01-24-12    coronary stents- no problems  . Cataract extraction, bilateral  01-24-12    Bilateral  . Orif shoulder fracture  02/06/2012    Procedure: OPEN REDUCTION INTERNAL FIXATION (ORIF) SHOULDER FRACTURE;  Surgeon: Nita Sells, MD;  Location: [facility];  Service: Orthopedics;  Laterality: Left;  ORIF of a Left Shoulder Fracture with  Iliac Crest Bone Graft aspiration   . Harvest bone graft  02/06/2012    Procedure: HARVEST ILIAC BONE GRAFT;  Surgeon: Nita Sells, MD;  Location: [facility];  Service: Orthopedics;;  bone marrow aspirqation   . Hardware removal  03/09/2012    Procedure: HARDWARE REMOVAL;  Surgeon: Nita Sells, MD;  Location: [facility];  Service: Orthopedics;  Laterality: Left;  Hardware Removal from Left Shoulder    Family History  Problem Relation Age of Onset  . Uterine cancer Mother     survivor  . Macular degeneration Mother   . Other Mother  ankle edema  . Lung cancer Mother   . Cancer Mother     ovarian, lung  . Coronary artery disease Father   . Hypertension Father   . Prostate cancer Father   . Colon polyps Father   . Cancer Father     prostate  . Thyroid disease Sister   . Heart attack Brother   . Hyperlipidemia Brother   . Other Brother     Schizophrenic  . Diabetes Neg Hx   . Colon cancer Neg Hx   . Esophageal cancer Neg Hx   . Rectal cancer Neg Hx   . Stomach cancer Neg Hx   . Coronary artery disease Maternal Aunt   . Heart attack Maternal Aunt   . Rheum arthritis Sister   . Hemochromatosis Sister     Social History History  Substance Use Topics  . Smoking status: Never Smoker   . Smokeless tobacco: Never Used  . Alcohol Use: No    Allergies  Allergen Reactions  . Penicillins Hives, Shortness Of Breath and Rash    REACTION:  hives, breathing problems  . Morphine And Related Nausea And Vomiting and Swelling    Current Outpatient Prescriptions  Medication Sig Dispense Refill  . Aclidinium Bromide (TUDORZA PRESSAIR) 400 MCG/ACT AEPB Inhale 1 puff into the lungs 2 (two) times daily.  1 each  11  . adalimumab (HUMIRA) 40 MG/0.8ML injection Inject 40 mg into the skin every 14 (fourteen) days.      . ALPRAZolam (XANAX XR) 1 MG 24 hr tablet Take 1 tablet (1 mg total) by mouth every morning.  30 tablet  5  . ALPRAZolam (XANAX) 0.5 MG tablet Take 1 tablet (0.5 mg total) by mouth 3 (three) times daily as needed for sleep or anxiety.  90 tablet  5  . aspirin (ASPIRIN EC) 81 MG EC tablet Take 81 mg by mouth every morning.       . celecoxib (CELEBREX) 200 MG capsule Take 200 mg by mouth 2 (two) times daily.      . cholecalciferol (VITAMIN D) 1000 UNITS tablet Take 1 tablet (1,000 Units total) by mouth daily.  100 tablet  3  . colchicine 0.6 MG tablet Take 1 tablet (0.6 mg total) by mouth 2 (two) times daily.  60 tablet  5  . Elastic Bandages & Supports (ABDOMINAL BINDER/ELASTIC MED) MISC 1 Device by Does not apply route as needed.  1 each  0  . FLUoxetine (PROZAC) 40 MG capsule Take 2 capsules (80 mg total) by mouth daily.  90 capsule  3  . fluticasone (FLONASE) 50 MCG/ACT nasal spray PLACE 2 SPRAYS INTO THE NOSE DAILY.  16 g  5  . furosemide (LASIX) 20 MG tablet Take 20 mg by mouth every morning.      . gabapentin (NEURONTIN) 300 MG capsule Take 300 mg by mouth 3 (three) times daily. 2 every am, 1 at noon, 2 at bedtime, 1 at 1:00 am.      . hydrochlorothiazide (HYDRODIURIL) 25 MG tablet Take 25 mg by mouth every morning.      [name] HYDROmorphone (DILAUDID) 2 MG tablet Take 1 tablet (2 mg total) by mouth every 4 (four) hours as needed for pain. May take up to 2 tablets if needed. Fill on or after 09/01/12.  60 tablet  0  . losartan (COZAAR) 50 MG tablet TAKE 1 TABLET BY MOUTH ONCE A DAY  90 tablet  0  . Melatonin ([facility] MELATONIN) 5 MG  TABS  Take 5 mg by mouth at bedtime.       [name] oxyCODONE (OXYCONTIN) 20 MG 12 hr tablet Take 1 tablet (20 mg total) by mouth every 12 (twelve) hours. FILL on or after 09/01/12.  60 tablet  0  . pantoprazole (PROTONIX) 40 MG tablet Take 1 tablet (40 mg total) by mouth 2 (two) times daily.  60 tablet  5  . Polyethyl Glycol-Propyl Glycol (SYSTANE ULTRA) 0.4-0.3 % SOLN Apply 1 drop to eye daily.      . potassium chloride SA (K-DUR,KLOR-CON) 20 MEQ tablet Take 1 tablet (20 mEq total) by mouth 2 (two) times daily.  60 tablet  3  . predniSONE (DELTASONE) 5 MG tablet Take 5 mg by mouth daily.      . roflumilast (DALIRESP) 500 MCG TABS tablet Take 1 tablet (500 mcg total) by mouth daily.  30 tablet  5  . simvastatin (ZOCOR) 40 MG tablet Take 40 mg by mouth every 3 (three) days. Tue, thu , sat      . traZODone (DESYREL) 100 MG tablet Take 1 tablet (100 mg total) by mouth at bedtime.  30 tablet  11   No current facility-administered medications for this visit.   Facility-Administered Medications Ordered in Other Visits  Medication Dose Route Frequency Provider Last Rate Last Dose  . loratadine (CLARITIN) tablet 10 mg  10 mg Oral Daily Hennie Duos, MD        Review of Systems Review of Systems  Respiratory: Positive for cough.   Gastrointestinal: Positive for abdominal pain and abdominal distention.  Musculoskeletal: Positive for arthralgias.    Blood pressure 122/74, pulse 64, temperature 97.7 F (36.5 C), temperature source Temporal, resp. rate 14, height 5' 6.5" (1.689 m), weight 154 lb 9.6 oz (70.126 kg).  Physical Exam Physical Exam  Constitutional: He appears well-developed and well-nourished. No distress.  HENT:  Head: Normocephalic and atraumatic.  Abdominal: Soft.  A right paramedian scar is present. A midline scar is present. Lateral to the paramedian scar and a radial type fashion there is asymmetric bulge. It resolves when he lies down. No palpable fascial defect.  Genitourinary:   Examination of both groin areas demonstrate no obvious clinical inguinal hernia palpable at this time.    Data Reviewed CT scan-At this demonstrates significant attenuation of the right lateral abdominal wall around the area of the bulge but no hernia..  Ms. [name] note.  Assessment    Right-sided abdominal wall attenuation/laxity with no evidence of hernia. I showed him the CT scan and discussed this with him.    Plan    Avoid strenuous core activities. I told him moderate activity was okay. Return as needed.        Kyden Potash J 09/03/2012, 5:33 PM

## 2012-09-04 ENCOUNTER — Ambulatory Visit: Payer: Medicare Other | Admitting: *Deleted

## 2012-09-09 ENCOUNTER — Ambulatory Visit: Payer: Medicare Other | Attending: Internal Medicine | Admitting: *Deleted

## 2012-09-09 DIAGNOSIS — R269 Unspecified abnormalities of gait and mobility: Secondary | ICD-10-CM | POA: Insufficient documentation

## 2012-09-09 DIAGNOSIS — IMO0001 Reserved for inherently not codable concepts without codable children: Secondary | ICD-10-CM | POA: Insufficient documentation

## 2012-09-09 DIAGNOSIS — H8110 Benign paroxysmal vertigo, unspecified ear: Secondary | ICD-10-CM | POA: Insufficient documentation

## 2012-09-10 ENCOUNTER — Telehealth: Payer: Self-pay

## 2012-09-10 MED ORDER — OXYCODONE HCL 20 MG PO TB12: 20.0000 mg | ORAL_TABLET | Freq: Two times a day (BID) | ORAL | Status: DC

## 2012-09-10 MED ORDER — HYDROMORPHONE HCL 2 MG PO TABS: 2.0000 mg | ORAL_TABLET | ORAL | Status: DC | PRN

## 2012-09-10 NOTE — Telephone Encounter (Signed)
Rx's printed waiting for signature.

## 2012-09-10 NOTE — Telephone Encounter (Signed)
Ok to prepare months Rx x 3. Thanks

## 2012-09-10 NOTE — Telephone Encounter (Signed)
Patient calls requesting a refill on Oxycontin 20 mg and Hydromophone 2 mg. 3 month supply. Please advise if okay to do. Thanks

## 2012-09-10 NOTE — Telephone Encounter (Signed)
Pt.notified

## 2012-09-11 ENCOUNTER — Ambulatory Visit: Payer: Medicare Other | Admitting: Physical Therapy

## 2012-09-14 ENCOUNTER — Telehealth: Payer: Self-pay | Admitting: *Deleted

## 2012-09-14 ENCOUNTER — Telehealth: Payer: Self-pay | Admitting: Neurology

## 2012-09-14 ENCOUNTER — Ambulatory Visit: Payer: Medicare Other | Admitting: Physical Therapy

## 2012-09-14 NOTE — Telephone Encounter (Signed)
Pt states he is finishing up with his therapy, but he is still having the vertigo. His therapist think he need to go back and see Dr. Maxie Barb who Dr. Linda Hedges referred him to in the past. Pt is wanting referral to go back for his vertigo...[name]

## 2012-09-14 NOTE — Telephone Encounter (Signed)
Dr. Maxie Barb?? He should be able to call and schedule his own appointment if he is already established with this consultant.

## 2012-09-14 NOTE — Telephone Encounter (Signed)
Notified pt with md response.../lmb 

## 2012-09-15 NOTE — Telephone Encounter (Signed)
Returned call, no answer. Left vmail.

## 2012-09-16 ENCOUNTER — Ambulatory Visit: Payer: Medicare Other | Admitting: Physical Therapy

## 2012-09-16 ENCOUNTER — Telehealth: Payer: Self-pay

## 2012-09-16 NOTE — Telephone Encounter (Signed)
Spoke to patient; scheduled hospital follow up OV.

## 2012-09-23 ENCOUNTER — Other Ambulatory Visit: Payer: Self-pay | Admitting: Internal Medicine

## 2012-09-23 NOTE — Telephone Encounter (Signed)
Okay to refill since Dr Linda Hedges is out of the office?

## 2012-09-24 NOTE — Telephone Encounter (Signed)
Alprazolam called to pharmacy

## 2012-09-24 NOTE — Telephone Encounter (Signed)
sch OV

## 2012-09-27 ENCOUNTER — Other Ambulatory Visit: Payer: Self-pay | Admitting: Internal Medicine

## 2012-09-28 NOTE — Telephone Encounter (Signed)
Alprazolam called to pharmacy

## 2012-10-02 ENCOUNTER — Ambulatory Visit (INDEPENDENT_AMBULATORY_CARE_PROVIDER_SITE_OTHER): Payer: Medicare Other | Admitting: Neurology

## 2012-10-02 ENCOUNTER — Encounter: Payer: Self-pay | Admitting: Neurology

## 2012-10-02 VITALS — BP 129/78 | HR 77 | Temp 98.9°F | Ht 65.0 in | Wt 158.0 lb

## 2012-10-02 DIAGNOSIS — G119 Hereditary ataxia, unspecified: Secondary | ICD-10-CM

## 2012-10-02 NOTE — Patient Instructions (Signed)
We will do more blood - it takes weeks to come back. Follow up in 2 months.

## 2012-10-02 NOTE — Progress Notes (Signed)
Subjective:    Patient ID: [name] is a 67 y.o. male.  HPI  Interim history:   Is is a 67 year old right-handed gentleman who presents for followup consultation after a recent hospitalization. He is unaccompanied today. I first met him on 07/14/2012 at the request of Dr. Ronnald Ramp. He has an underlying complex medical history including rheumatoid arthritis, osteoarthritis, COPD, atrial fibrillation, anemia, hypertension, heart disease, reported fairly sudden onset unsteadiness of his gait, vertigo, dizziness, blurry vision since early March 2014 there was no significant prodromal illness. His wife had noted some slurring of speech. Head CT on 06/22/2012 showed no acute abnormality. 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 He also reported some nausea for which meclizine and scopolamine patch helped. Arrange for a hospital admission from clinic because of concerns with cerebellar dysfunction including dysarthria, nystagmus, but also some alteration in his mental status. The patient was not fully oriented and was sluggish in his responses and seemed confused, talking out of context at times. He had mild dysmetria bilaterally. He was in the hospital from 4/8 to 07/16/12 for further eval and w/u included an EEG, which was normal in the wake, CSF studies showed no evidence infection, but high protein at 142, increased IgG index and greater than 5 OCB, indicative of some autoimmune process. I wonder, if this is to do with his Dx of RA on Humira for the past 18 months.  His VDRL was nonreactive and the CSF. His white cell count was normal. Gram stain was negative. Cultures were negative. He has since then been through PT and OT and has done well. His speech is improved and his altered mental status is essentially resolved. Nevertheless he is still complaining of vertiginous symptoms and abnormal gait. He just does not feel right although he  feels improved. He does have some family history of cancer including uterine cancer, and his sister, lung cancer in his mother who was a smoker. He had a negative chest x-ray and recently had abdomen and pelvis CT because of hernia which did not show any tumors.   His Past Medical History Is Significant For: Past Medical History  Diagnosis Date  . Hx of colonic polyps   . Diverticulosis   . Hypertension   . Osteoarthritis   . 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 CAD (coronary artery disease)     minimal coronary plaque in the LAD and right coronary system. 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 7-06. Last nuclear stress 11-17-06 fixed anterior/ inferior defect, no inducible ischemia, EF 81%  . Allergic rhinitis   . Hx of colonoscopy   . RA (rheumatoid arthritis)   . COPD (chronic obstructive pulmonary disease)   . PONV (postoperative nausea and vomiting)   . Dysrhythmia 01-24-12    past hx. A.Fib x1 episode-responded to med.    His Past Surgical History Is Significant For: Past Surgical History  Procedure Laterality Date  . Appendectomy    . Inguinal hernia repair  2008    Right, left remotely  . Total knee arthroplasty      right knee partial 2002  . Tonsillectomy    . Shoulder preplacement  2008    left, partial  . Ptca w/ coated stent lad      Cx secondary to tear  . Foot surgery  11-08    for removal of bone spurs- right foot  . Reconstruction of ankle  6-09    Right- [facility]  . L4-5 diskectomy w/ fusion, cage placement and rods  12-10    [place]  . Partial knee arthroplasty      left  . Total shoulder replacement      right  . Joint replacement    . Cardiac catheterization  01-24-12    coronary stents- no problems  . Cataract extraction, bilateral  01-24-12    Bilateral  . Orif shoulder fracture  02/06/2012    Procedure: OPEN REDUCTION INTERNAL  FIXATION (ORIF) SHOULDER FRACTURE;  Surgeon: Nita Sells, MD;  Location: [facility];  Service: Orthopedics;  Laterality: Left;  ORIF of a Left Shoulder Fracture with  Iliac Crest Bone Graft aspiration   . Harvest bone graft  02/06/2012    Procedure: HARVEST ILIAC BONE GRAFT;  Surgeon: Nita Sells, MD;  Location: [facility];  Service: Orthopedics;;  bone marrow aspirqation   . Hardware removal  03/09/2012    Procedure: HARDWARE REMOVAL;  Surgeon: Nita Sells, MD;  Location: [place];  Service: Orthopedics;  Laterality: Left;  Hardware Removal from Left Shoulder    His Family History Is Significant For: Family History  Problem Relation Age of Onset  . Uterine cancer Mother     survivor  . Macular degeneration Mother   . Other Mother     ankle edema  . Lung cancer Mother   . Cancer Mother     ovarian, lung  . Coronary artery disease Father   . Hypertension Father   . Prostate cancer Father   . Colon polyps Father   . Cancer Father     prostate  . Thyroid disease Sister   . Heart attack Brother   . Hyperlipidemia Brother   . Other Brother     Schizophrenic  . Diabetes Neg Hx   . Colon cancer Neg Hx   . Esophageal cancer Neg Hx   . Rectal cancer Neg Hx   . Stomach cancer Neg Hx   . Coronary artery disease Maternal Aunt   . Heart attack Maternal Aunt   . Rheum arthritis Sister   . Hemochromatosis Sister     Her Social History Is Significant For: History   Social History  . Marital Status: Married    Spouse Name: N/A    Number of Children: 2  . Years of Education: 16   Occupational History  . 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 Smoking status: Never Smoker   . Smokeless tobacco: Never Used  . Alcohol Use: No  . Drug Use: No  . Sexually Active: Yes -- Male partner(s)   Other Topics Concern  . None   Social History Narrative   [employer] - [employer]. Married 1972-22yr seperated/divorced.  Engaged April '11. 1 son '74; step-daughter '70. 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 During '11 discovered an [employer]- $500,000+ loss. Is handling this OK - will keep the business running.    His Allergies Are:  Allergies  Allergen Reactions  . Penicillins Hives, Shortness Of Breath and Rash    REACTION: hives, breathing problems  . Morphine And Related Nausea And Vomiting and Swelling  :   His Current Medications Are:  Outpatient Encounter Prescriptions as  of 10/02/2012  Medication Sig Dispense Refill  . Aclidinium Bromide (TUDORZA PRESSAIR) 400 MCG/ACT AEPB Inhale 1 puff into the lungs 2 (two) times daily.  1 each  11  . adalimumab (HUMIRA) 40 MG/0.8ML injection Inject 40 mg into the skin every 14 (fourteen) days.      . ALPRAZolam (XANAX XR) 1 MG 24 hr tablet Take 1 tablet (1 mg total) by mouth every morning.  30 tablet  5  . ALPRAZolam (XANAX) 0.5 MG tablet TAKE 1 TABLET BY MOUTH 3 TIMES A DAY AS NEEDED FOR SLEEP OR ANXIETY  90 tablet  2  . aspirin (ASPIRIN EC) 81 MG EC tablet Take 81 mg by mouth every morning.       . celecoxib (CELEBREX) 200 MG capsule Take 200 mg by mouth 2 (two) times daily.      . cholecalciferol (VITAMIN D) 1000 UNITS tablet Take 1 tablet (1,000 Units total) by mouth daily.  100 tablet  3  . colchicine 0.6 MG tablet Take 1 tablet (0.6 mg total) by mouth 2 (two) times daily.  60 tablet  5  . Elastic Bandages & Supports (ABDOMINAL BINDER/ELASTIC MED) MISC 1 Device by Does not apply route as needed.  1 each  0  . FLUoxetine (PROZAC) 40 MG capsule Take 2 capsules (80 mg total) by mouth daily.  90 capsule  3  . fluticasone (FLONASE) 50 MCG/ACT nasal spray PLACE 2 SPRAYS INTO THE NOSE DAILY.  16 g  5  . furosemide (LASIX) 20 MG tablet Take 20 mg by mouth every morning.      . gabapentin (NEURONTIN) 300 MG capsule Take 300 mg by mouth 3  (three) times daily. 2 every am, 1 at noon, 2 at bedtime, 1 at 1:00 am.      . hydrochlorothiazide (HYDRODIURIL) 25 MG tablet Take 25 mg by mouth every morning.      [name] losartan (COZAAR) 50 MG tablet TAKE 1 TABLET BY MOUTH ONCE A DAY  90 tablet  0  . Melatonin ([facility] MELATONIN) 5 MG TABS Take 5 mg by mouth at bedtime.       [name] oxyCODONE (OXYCONTIN) 20 MG 12 hr tablet Take 1 tablet (20 mg total) by mouth every 12 (twelve) hours. FILL on or after 12/02/12.  60 tablet  0  . pantoprazole (PROTONIX) 40 MG tablet Take 1 tablet (40 mg total) by mouth 2 (two) times daily.  60 tablet  5  . Polyethyl Glycol-Propyl Glycol (SYSTANE ULTRA) 0.4-0.3 % SOLN Apply 1 drop to eye daily.      . potassium chloride SA (K-DUR,KLOR-CON) 20 MEQ tablet Take 1 tablet (20 mEq total) by mouth 2 (two) times daily.  60 tablet  3  . predniSONE (DELTASONE) 5 MG tablet Take 5 mg by mouth daily.      . roflumilast (DALIRESP) 500 MCG TABS tablet Take 1 tablet (500 mcg total) by mouth daily.  30 tablet  5  . simvastatin (ZOCOR) 40 MG tablet Take 40 mg by mouth every 3 (three) days. Tue, thu , sat      . traZODone (DESYREL) 100 MG tablet Take 1 tablet (100 mg total) by mouth at bedtime.  30 tablet  11  . HYDROmorphone (DILAUDID) 2 MG tablet Take 1 tablet (2 mg total) by mouth every 4 (four) hours as needed for pain. May take up to 2 tablets if needed. Fill on or after 12/02/12.  60 tablet  0   Facility-Administered Encounter Medications as of 10/02/2012  Medication Dose Route Frequency Provider Last Rate Last Dose  . loratadine (CLARITIN) tablet 10 mg  10 mg Oral Daily Hennie Duos, MD      :  Review of Systems  Constitutional: Positive for fatigue.  Eyes: Positive for visual disturbance (blurred vision).  Genitourinary:       Impotence  Musculoskeletal: Positive for myalgias, joint swelling and arthralgias.  Neurological: Positive for dizziness.    Objective:  Neurologic Exam  Physical Exam Physical Examination:   Filed  Vitals:   10/02/12 1056  BP: 129/78  Pulse: 77  Temp: 98.9 F (37.2 C)    General Examination: The patient is a very pleasant 67 y.o. male in no acute distress.  HEENT: Normocephalic, atraumatic, pupils are equal, round and reactive to light and accommodation. Funduscopic exam is normal with sharp disc margins noted. Extraocular tracking is fair and actually improved from last time with no nystagmus seen. Hearing is grossly intact. Tympanic membranes are clear bilaterally. Face is symmetric with normal facial animation and normal facial sensation. Speech is mildly dysarthric with a mild scanning component. There is no lip, neck or jaw tremor. Neck is supple with full range of motion. There are no carotid bruits on auscultation. Oropharynx exam reveals moderate mouth dryness. No significant airway crowding is noted. Mallampati is class II. Tongue protrudes centrally and palate elevates symmetrically. Tongue movements are fine.   Chest: is clear to auscultation without wheezing, rhonchi or crackles noted.  Heart: sounds are regular and normal without murmurs, rubs or gallops noted.   Abdomen: is soft, non-tender and non-distended with normal bowel sounds appreciated on auscultation.  Extremities: There is no pitting edema in the distal lower extremities bilaterally. Pedal pulses are intact.  Skin: is warm and dry with no trophic changes noted.  Musculoskeletal: exam reveals no obvious joint deformities, tenderness or joint swelling or erythema.  Neurologically:  Mental status: The patient is awake, paying attention. He is fully oriented, and able to give a fairly good history today. His memory, attention, language and knowledge are improved from last time even though he still has some mild word finding difficulties. Speech is improved and only minimally dysarthric.   Cranial nerves are as described above under HEENT exam. In addition, shoulder shrug is normal with equal shoulder height  noted. Motor exam: Normal bulk, and strength are noted. Tone seems normal. There is no drift, or tremor, no rebound Romberg is negative but he does stand a little wider based.  Reflexes are 1+ throughout. Fine motor skills are mildly impaired, bilaterally. On finger to nose testing he has very mild dysmetria bilaterally. He has no significant intention tremor. There is no truncal ataxia.  Sensory exam is intact to light touch, pinprick, vibration sense.  Gait, station and balance: He stands up with mild difficulty and only requires one attempt. His stance is mildly wide-based. His walking is mildly ataxic and he has trouble turning. Arm swing is preserved bilaterally. Posture is mildly stooped to age-appropriate. He is able to do tandem walk Briefly. He is able to pull himself up on his toes and very briefly on his heels.   Assessment and Plan:   In summary, NADEEM ROMANOSKI is a very pleasant 67 y.o.-year old male with a complex medical history, who presents with abrupt onset vertigo in April 2014 and was hospitalized from clinic On 07/14/2012. Work up thus far has shown CSF increase protein, increased IgG index and positive OCB. His AMS has resolved and exam is  a little better, he also benefited from therapy  which is now completed. DDx include, cerebellitis, meningitis/encephalitis, metabolic/toxic, autoimmune/paraneoplastic. I recommended paraneoplastic testing. UDS and EtOH was neg. EEG normal, negative chest x-ray and negative CT abdomen and pelvis for any mass. He is on immune suppression with Humira. His brain MRI did not show any demyelinating process or mass or enhancing lesion. I would like to see him back in a couple months for recheck and we will consider a second opinion at a tertiary [facility] at the time. He was in agreement. He is advised to call in the interim for any questions, concerns, problems or updates.

## 2012-10-05 ENCOUNTER — Ambulatory Visit (INDEPENDENT_AMBULATORY_CARE_PROVIDER_SITE_OTHER): Payer: Medicare Other | Admitting: Internal Medicine

## 2012-10-05 ENCOUNTER — Encounter: Payer: Self-pay | Admitting: Internal Medicine

## 2012-10-05 VITALS — BP 114/66 | HR 95 | Temp 98.0°F | Resp 8 | Ht 66.0 in | Wt 156.4 lb

## 2012-10-05 DIAGNOSIS — R42 Dizziness and giddiness: Secondary | ICD-10-CM

## 2012-10-05 NOTE — Patient Instructions (Addendum)
Always good to see you - never a dull moment or a simple diagnosis  The articles on advanced diagnostic testing for markers of MS may be helpful, especially in talking with Dr. Rexene Alberts.  Thinning of right abdominal wall musculature - may be old scarring. It is very unlikely to be any risk of danger rupture. It is ok to exercise: tone, endurance and flexibility.

## 2012-10-06 NOTE — Assessment & Plan Note (Signed)
Discussed with him Dr. Juliene Pina recent evaluation and plan for additional serology. Her last note is not yet scanned.  Went on-line to PubMed and pulled several abstracts dealing with markers for MS, especially immunologic markers, which were printed for his education. Reviewed his evaluation to date as well. A demyelinating disease is certainly a possibility.  He will follow up with Dr. Rexene Alberts  (greater than 50% of a 30 min visit spent on education and counseling)

## 2012-10-06 NOTE — Progress Notes (Signed)
Subjective:    Patient ID: [name], male    DOB: 1945-07-29, 67 y.o.   MRN: [id number]  HPI [name] continues to have intermittent episodes of dizziness/dysequilibrium. Hehas not seen real improvement with neuro-rehab and this may suggest that the problem is not vestibular. He has seen Dr. Rexene Alberts for neurology consult/follow up and he reports that there are several esoteric blood tests pending insurance approval. He has been functional but admits that his son is doing more with the business than ever.  Past Medical History  Diagnosis Date  . Hx of colonic polyps   . Diverticulosis   . Hypertension   . Osteoarthritis   . 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 CAD (coronary artery disease)     minimal coronary plaque in the LAD and right coronary system. 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 7-06. Last nuclear stress 11-17-06 fixed anterior/ inferior defect, no inducible ischemia, EF 81%  . Allergic rhinitis   . Hx of colonoscopy   . RA (rheumatoid arthritis)   . COPD (chronic obstructive pulmonary disease)   . PONV (postoperative nausea and vomiting)   . Dysrhythmia 01-24-12    past hx. A.Fib x1 episode-responded to med.   Past Surgical History  Procedure Laterality Date  . Appendectomy    . Inguinal hernia repair  2008    Right, left remotely  . Total knee arthroplasty      right knee partial 2002  . Tonsillectomy    . Shoulder preplacement  2008    left, partial  . Ptca w/ coated stent lad      Cx secondary to tear  . Foot surgery  11-08    for removal of bone spurs- right foot  . Reconstruction of ankle  6-09    Right- [facility]  . L4-5 diskectomy w/ fusion, cage placement and rods  12-10    [place]  . Partial knee arthroplasty      left  . Total shoulder replacement      right  . Joint replacement    . Cardiac catheterization  01-24-12     coronary stents- no problems  . Cataract extraction, bilateral  01-24-12    Bilateral  . Orif shoulder fracture  02/06/2012    Procedure: OPEN REDUCTION INTERNAL FIXATION (ORIF) SHOULDER FRACTURE;  Surgeon: Nita Sells, MD;  Location[facility];  Service: Orthopedics;  Laterality: Left;  ORIF of a Left Shoulder Fracture with  Iliac Crest Bone Graft aspiration   . Harvest bone graft  02/06/2012    Procedure: HARVEST ILIAC BONE GRAFT;  Surgeon: Nita Sells, MD;  [facility];  Service: Orthopedics;;  bone marrow aspirqation   . Hardware removal  03/09/2012    Procedure: HARDWARE REMOVAL;  Surgeon: Nita Sells, MD;  [facility];  Service: Orthopedics;  Laterality: Left;  Hardware Removal from Left Shoulder   Family History  Problem Relation Age of Onset  . Uterine cancer Mother     survivor  . Macular degeneration Mother   . Other Mother     ankle edema  . Lung cancer Mother   . Cancer Mother     ovarian, lung  . Coronary artery disease Father   . Hypertension Father   . Prostate cancer Father   . Colon polyps Father   . Cancer Father  prostate  . Thyroid disease Sister   . Heart attack Brother   . Hyperlipidemia Brother   . Other Brother     Schizophrenic  . Diabetes Neg Hx   . Colon cancer Neg Hx   . Esophageal cancer Neg Hx   . Rectal cancer Neg Hx   . Stomach cancer Neg Hx   . Coronary artery disease Maternal Aunt   . Heart attack Maternal Aunt   . Rheum arthritis Sister   . Hemochromatosis Sister    History   Social History  . Marital Status: Married    Spouse Name: N/A    Number of Children: 2  . Years of Education: 16   Occupational History  . 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 Smoking status: Never Smoker   . Smokeless tobacco: Never Used  . Alcohol Use: No  . Drug Use: No  . Sexually Active: Yes -- Male partner(s)   Other Topics Concern  . Not on file    Social History Narrative   [employer] [place]. Married 1972-32yr seperated/divorced. Engaged April '11. 1 son '74; step-daughter '70. 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 During '11 discovered an [employer]- $500,000+ loss. Is handling this OK - will keep the business running.    Current Outpatient Prescriptions on File Prior to Visit  Medication Sig Dispense Refill  . Aclidinium Bromide (TUDORZA PRESSAIR) 400 MCG/ACT AEPB Inhale 1 puff into the lungs 2 (two) times daily.  1 each  11  . adalimumab (HUMIRA) 40 MG/0.8ML injection Inject 40 mg into the skin every 14 (fourteen) days.      . ALPRAZolam (XANAX XR) 1 MG 24 hr tablet Take 1 tablet (1 mg total) by mouth every morning.  30 tablet  5  . ALPRAZolam (XANAX) 0.5 MG tablet TAKE 1 TABLET BY MOUTH 3 TIMES A DAY AS NEEDED FOR SLEEP OR ANXIETY  90 tablet  2  . aspirin (ASPIRIN EC) 81 MG EC tablet Take 81 mg by mouth every morning.       . celecoxib (CELEBREX) 200 MG capsule Take 200 mg by mouth 2 (two) times daily.      . cholecalciferol (VITAMIN D) 1000 UNITS tablet Take 1 tablet (1,000 Units total) by mouth daily.  100 tablet  3  . colchicine 0.6 MG tablet Take 1 tablet (0.6 mg total) by mouth 2 (two) times daily.  60 tablet  5  . Elastic Bandages & Supports (ABDOMINAL BINDER/ELASTIC MED) MISC 1 Device by Does not apply route as needed.  1 each  0  . FLUoxetine (PROZAC) 40 MG capsule Take 2 capsules (80 mg total) by mouth daily.  90 capsule  3  . fluticasone (FLONASE) 50 MCG/ACT nasal spray PLACE 2 SPRAYS INTO THE NOSE DAILY.  16 g  5  . furosemide (LASIX) 20 MG tablet Take 20 mg by mouth every morning.      . gabapentin (NEURONTIN) 300 MG capsule Take 300 mg by mouth 3 (three) times daily. 2 every am, 1 at noon, 2 at bedtime, 1 at 1:00 am.      . hydrochlorothiazide (HYDRODIURIL) 25 MG tablet Take 25 mg by mouth every  morning.      .Marland KitchenHYDROmorphone (DILAUDID) 2 MG tablet Take 1 tablet (2 mg total) by mouth every 4 (four) hours as needed for  pain. May take up to 2 tablets if needed. Fill on or after 12/02/12.  60 tablet  0  . losartan (COZAAR) 50 MG tablet TAKE 1 TABLET BY MOUTH ONCE A DAY  90 tablet  0  . Melatonin ([facility] MELATONIN) 5 MG TABS Take 5 mg by mouth at bedtime.       [name] oxyCODONE (OXYCONTIN) 20 MG 12 hr tablet Take 1 tablet (20 mg total) by mouth every 12 (twelve) hours. FILL on or after 12/02/12.  60 tablet  0  . pantoprazole (PROTONIX) 40 MG tablet Take 1 tablet (40 mg total) by mouth 2 (two) times daily.  60 tablet  5  . Polyethyl Glycol-Propyl Glycol (SYSTANE ULTRA) 0.4-0.3 % SOLN Apply 1 drop to eye daily.      . potassium chloride SA (K-DUR,KLOR-CON) 20 MEQ tablet Take 1 tablet (20 mEq total) by mouth 2 (two) times daily.  60 tablet  3  . predniSONE (DELTASONE) 5 MG tablet Take 5 mg by mouth daily.      . roflumilast (DALIRESP) 500 MCG TABS tablet Take 1 tablet (500 mcg total) by mouth daily.  30 tablet  5  . simvastatin (ZOCOR) 40 MG tablet Take 40 mg by mouth every 3 (three) days. Tue, thu , sat      . traZODone (DESYREL) 100 MG tablet Take 1 tablet (100 mg total) by mouth at bedtime.  30 tablet  11   Current Facility-Administered Medications on File Prior to Visit  Medication Dose Route Frequency Provider Last Rate Last Dose  . loratadine (CLARITIN) tablet 10 mg  10 mg Oral Daily Hennie Duos, MD          Review of Systems ROSB    Objective:   Physical Exam Filed Vitals:   10/05/12 0937  BP: 114/66  Pulse: 95  Temp: 98 F (36.7 C)  Resp: 8   Gen'l - WNWD white man with many obvious joint deformities. HEENT C&S clear Cor- 2+ radial RRR Pulm - normal respirations Neuro - A&O x 3, able to ambulate w/o cane or assist.       Assessment & Plan:

## 2012-10-12 ENCOUNTER — Telehealth: Payer: Self-pay | Admitting: Neurology

## 2012-10-12 NOTE — Telephone Encounter (Signed)
Placed form for [employer] [facility] in Dr. Tori Milks inbox.

## 2012-10-13 NOTE — Telephone Encounter (Signed)
I called and spoke to pt and his responsibility for # [place] lab would be $ 1000.00  (total $ [phone]).   [facility]ed to [facility] lab request.   Pt to have done this afternoon.  He was made aware of cost.

## 2012-10-23 ENCOUNTER — Other Ambulatory Visit: Payer: Self-pay | Admitting: Internal Medicine

## 2012-11-03 ENCOUNTER — Telehealth: Payer: Self-pay | Admitting: Internal Medicine

## 2012-11-03 NOTE — Telephone Encounter (Signed)
[facility] called from the [facility] stating that Mr. Prashad got percocet from Dr. Maryjean Ka on 10/26/12. [facility] would like to know if [facility] is ok to fill his pain med from Dr. Linda Hedges. Please advise.

## 2012-11-03 NOTE — Telephone Encounter (Signed)
He routinely takes oxycontin and has Rx for dilaudid for breakthrough. I would not fill percocet order.

## 2012-11-04 NOTE — Telephone Encounter (Signed)
noted 

## 2012-11-04 NOTE — Telephone Encounter (Signed)
Fyi, I spoke to [place] [phone] and was advised they did not fill his Dilaudid so patient took the script [facility] [address] and [facility] there. So he has that and the Percocet that was filled 10/26/12 from Dr Maryjean Ka.

## 2012-11-11 ENCOUNTER — Other Ambulatory Visit: Payer: Self-pay

## 2012-11-21 ENCOUNTER — Other Ambulatory Visit: Payer: Self-pay | Admitting: Internal Medicine

## 2012-11-23 ENCOUNTER — Encounter: Payer: Self-pay | Admitting: Neurology

## 2012-12-15 ENCOUNTER — Ambulatory Visit (INDEPENDENT_AMBULATORY_CARE_PROVIDER_SITE_OTHER): Payer: Medicare Other | Admitting: Neurology

## 2012-12-15 ENCOUNTER — Encounter: Payer: Self-pay | Admitting: Neurology

## 2012-12-15 VITALS — BP 126/66 | HR 46 | Temp 97.9°F | Ht 65.0 in | Wt 151.0 lb

## 2012-12-15 DIAGNOSIS — M069 Rheumatoid arthritis, unspecified: Secondary | ICD-10-CM

## 2012-12-15 DIAGNOSIS — M5137 Other intervertebral disc degeneration, lumbosacral region: Secondary | ICD-10-CM

## 2012-12-15 DIAGNOSIS — G8929 Other chronic pain: Secondary | ICD-10-CM

## 2012-12-15 DIAGNOSIS — G119 Hereditary ataxia, unspecified: Secondary | ICD-10-CM

## 2012-12-15 DIAGNOSIS — R42 Dizziness and giddiness: Secondary | ICD-10-CM

## 2012-12-15 NOTE — Patient Instructions (Signed)
Follow up in 3 months, no new test today. We may consider another MRI brain with and without contrast in the future and may consider a second opinion at [facility] or [facility] or [facility].
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
## 2012-12-18 ENCOUNTER — Other Ambulatory Visit: Payer: Self-pay | Admitting: Anesthesiology

## 2012-12-18 DIAGNOSIS — M5416 Radiculopathy, lumbar region: Secondary | ICD-10-CM

## 2012-12-28 ENCOUNTER — Other Ambulatory Visit: Payer: Self-pay | Admitting: Anesthesiology

## 2012-12-28 DIAGNOSIS — M5416 Radiculopathy, lumbar region: Secondary | ICD-10-CM

## 2012-12-28 DIAGNOSIS — M25551 Pain in right hip: Secondary | ICD-10-CM

## 2012-12-29 ENCOUNTER — Other Ambulatory Visit: Payer: Self-pay | Admitting: Internal Medicine

## 2012-12-29 ENCOUNTER — Ambulatory Visit
Admission: RE | Admit: 2012-12-29 | Discharge: 2012-12-29 | Disposition: A | Discharge status: Home / Self Care | Payer: Medicare Other | Source: Ambulatory Visit | Attending: Anesthesiology | Admitting: Anesthesiology

## 2012-12-29 DIAGNOSIS — M25551 Pain in right hip: Secondary | ICD-10-CM

## 2012-12-29 DIAGNOSIS — M5416 Radiculopathy, lumbar region: Secondary | ICD-10-CM

## 2013-01-01 ENCOUNTER — Other Ambulatory Visit: Payer: Self-pay

## 2013-01-01 MED ORDER — OXYCODONE HCL 20 MG PO TB12: 20.0000 mg | ORAL_TABLET | Freq: Two times a day (BID) | ORAL | Status: DC

## 2013-01-01 MED ORDER — HYDROMORPHONE HCL 2 MG PO TABS: 2.0000 mg | ORAL_TABLET | ORAL | Status: DC | PRN

## 2013-01-01 NOTE — Telephone Encounter (Signed)
Pt called [facility] request 3 mo refills on oxy and dilaudid. [facility] advising ready for pick up

## 2013-01-05 ENCOUNTER — Ambulatory Visit (INDEPENDENT_AMBULATORY_CARE_PROVIDER_SITE_OTHER): Payer: Medicare Other

## 2013-01-05 DIAGNOSIS — Z23 Encounter for immunization: Secondary | ICD-10-CM

## 2013-01-08 ENCOUNTER — Other Ambulatory Visit: Payer: Self-pay | Admitting: Internal Medicine

## 2013-01-18 ENCOUNTER — Encounter: Payer: Self-pay | Admitting: Internal Medicine

## 2013-01-18 ENCOUNTER — Ambulatory Visit (INDEPENDENT_AMBULATORY_CARE_PROVIDER_SITE_OTHER): Payer: Medicare Other | Admitting: Internal Medicine

## 2013-01-18 VITALS — BP 120/62 | HR 78 | Temp 97.0°F | Wt 150.0 lb

## 2013-01-18 DIAGNOSIS — IMO0002 Reserved for concepts with insufficient information to code with codable children: Secondary | ICD-10-CM

## 2013-01-18 NOTE — Assessment & Plan Note (Signed)
Patient presents to discuss right leg pain and studies. Reviewed the images and reports from MRI L-S spine and hip. He reported the opinion of ortho that his hip is not the source of his pain. His degree of pain is limiting his work.  Plan He will be meeting with Dr. Joya Salm to discuss options for treatment. He is willing to undergo surgery/microdiskectomy in a setting of high risk due to the quality of his life being so compromised.  (greater than 50% of 25 min visit spent on education and counseling)

## 2013-01-18 NOTE — Progress Notes (Signed)
Subjective:    Patient ID: [name], male    DOB: Aug 07, 1945, 67 y.o.   MRN: [id number]  HPI [name] presents for on-going severe pain in the right proximal thigh. He has had recent MRI lumbar spine - has a mildly displace L4 nerve root to the right.  He has a h/o back problems and has had interbody fusion L4-5. He has failed ESI for his current pain. He has had his hip studied - labral tear and acetabulum cyst but ortho does not feel this is the origin of his pain. He is otherwise doing ok but he is becoming more limited in his activities due to the pain.    PMH, FamHx and SocHx reviewed for any changes and relevance.  Current Outpatient Prescriptions on File Prior to Visit  Medication Sig Dispense Refill  . Aclidinium Bromide (TUDORZA PRESSAIR) 400 MCG/ACT AEPB Inhale 1 puff into the lungs 2 (two) times daily.  1 each  11  . adalimumab (HUMIRA) 40 MG/0.8ML injection Inject 40 mg into the skin every 14 (fourteen) days.      . ALPRAZolam (XANAX) 0.5 MG tablet TAKE 1 TABLET BY MOUTH 3 TIMES A DAY AS NEEDED FOR SLEEP OR ANXIETY  90 tablet  2  . aspirin (ASPIRIN EC) 81 MG EC tablet Take 81 mg by mouth every morning.       . CELEBREX 200 MG capsule TAKE 1 CAPSULE TWICE A DAY  60 capsule  3  . cholecalciferol (VITAMIN D) 1000 UNITS tablet Take 1 tablet (1,000 Units total) by mouth daily.  100 tablet  3  . colchicine 0.6 MG tablet Take 1 tablet (0.6 mg total) by mouth 2 (two) times daily.  60 tablet  5  . Elastic Bandages & Supports (ABDOMINAL BINDER/ELASTIC MED) MISC 1 Device by Does not apply route as needed.  1 each  0  . FLUoxetine (PROZAC) 40 MG capsule TAKE 2 CAPSULES (80 MG TOTAL) BY MOUTH DAILY.  90 capsule  1  . fluticasone (FLONASE) 50 MCG/ACT nasal spray PLACE 2 SPRAYS INTO THE NOSE DAILY.  16 g  5  . furosemide (LASIX) 20 MG tablet TAKE 1 TABLET EVERY DAY  30 tablet  3  . gabapentin (NEURONTIN) 300 MG capsule Take 300 mg by mouth 3 (three) times daily. 2 every am, 1 at  noon, 2 at bedtime, 1 at 1:00 am.      . hydrochlorothiazide (HYDRODIURIL) 25 MG tablet TAKE 1 TABLET (25 MG TOTAL) BY MOUTH DAILY.  30 tablet  5  . HYDROmorphone (DILAUDID) 2 MG tablet Take 1 tablet (2 mg total) by mouth every 4 (four) hours as needed for pain. May take up to 2 tablets if needed. Fill on or after 03/04/13.  60 tablet  0  . losartan (COZAAR) 50 MG tablet TAKE 1 TABLET BY MOUTH ONCE A DAY  90 tablet  0  . Melatonin ([employer] MELATONIN) 5 MG TABS Take 5 mg by mouth at bedtime.       . pantoprazole (PROTONIX) 40 MG tablet Take 1 tablet (40 mg total) by mouth 2 (two) times daily.  60 tablet  5  . Polyethyl Glycol-Propyl Glycol (SYSTANE ULTRA) 0.4-0.3 % SOLN Apply 1 drop to eye daily.      . potassium chloride SA (K-DUR,KLOR-CON) 20 MEQ tablet TAKE 1 TABLET (20 MEQ TOTAL) BY MOUTH 2 (TWO) TIMES DAILY.   60 tablet  0  . predniSONE (DELTASONE) 5 MG tablet Take 5 mg by mouth daily.      [name]  roflumilast (DALIRESP) 500 MCG TABS tablet Take 1 tablet (500 mcg total) by mouth daily.  30 tablet  5  . simvastatin (ZOCOR) 40 MG tablet Take 40 mg by mouth every 3 (three) days. Tue, thu , sat      . traZODone (DESYREL) 100 MG tablet Take 1 tablet (100 mg total) by mouth at bedtime.  30 tablet  11   Current Facility-Administered Medications on File Prior to Visit  Medication Dose Route Frequency Provider Last Rate Last Dose  . loratadine (CLARITIN) tablet 10 mg  10 mg Oral Daily Hennie Duos, MD        Review of Systems System review is negative for any constitutional, cardiac, pulmonary, GI or neuro symptoms or complaints other than as described in the HPI.     Objective:   Physical Exam Filed Vitals:   01/18/13 1039  BP: 120/62  Pulse: 78  Temp: 97 F (36.1 C)   Gen'l - WNWD now clean shaven man HEENT- C&WS clear Cor- 2+ radial RRR Pulm n- normal respirations Neuro - A&O x 3, very rational. Walks in unassisted - no cane or walker.       Assessment & Plan:

## 2013-01-23 ENCOUNTER — Other Ambulatory Visit: Payer: Self-pay | Admitting: Internal Medicine

## 2013-01-25 ENCOUNTER — Other Ambulatory Visit: Payer: Self-pay | Admitting: Internal Medicine

## 2013-01-25 NOTE — Telephone Encounter (Signed)
Alprazolam called to pharmacy

## 2013-01-29 ENCOUNTER — Ambulatory Visit (INDEPENDENT_AMBULATORY_CARE_PROVIDER_SITE_OTHER): Payer: Medicare Other

## 2013-01-29 ENCOUNTER — Ambulatory Visit: Payer: Medicare Other

## 2013-01-29 VITALS — BP 107/62 | HR 80 | Resp 16

## 2013-01-29 DIAGNOSIS — M79671 Pain in right foot: Secondary | ICD-10-CM

## 2013-01-29 DIAGNOSIS — Z09 Encounter for follow-up examination after completed treatment for conditions other than malignant neoplasm: Secondary | ICD-10-CM

## 2013-01-29 DIAGNOSIS — M79609 Pain in unspecified limb: Secondary | ICD-10-CM

## 2013-01-29 DIAGNOSIS — M2040 Other hammer toe(s) (acquired), unspecified foot: Secondary | ICD-10-CM

## 2013-01-29 DIAGNOSIS — M069 Rheumatoid arthritis, unspecified: Secondary | ICD-10-CM

## 2013-01-29 NOTE — Patient Instructions (Signed)
Rheumatoid Arthritis Rheumatoid arthritis is a long-term (chronic) inflammatory disease that causes pain, swelling, and stiffness of the joints. It can affect the entire body, including the eyes and lungs. The effects of rheumatoid arthritis vary widely among those with the condition. CAUSES  The cause of rheumatoid arthritis is not known. It tends to run in families and is more common in women. Certain cells of the body's natural defense system (immune system) do not work properly and begin to attack healthy joints. It primarily involves the connective tissue that lines the joints (synovial membrane). This can cause damage to the joint. SYMPTOMS   Pain, stiffness, swelling, and decreased motion of many joints, especially in the hands and feet.  Stiffness that is worse in the morning. It may last 1 2 hours or longer.  Numbness and tingling in the hands.  Fatigue.  Loss of appetite.  Weight loss.  Low-grade fever.  Dry eyes and mouth.  Firm lumps (rheumatoid nodules) that grow beneath the skin in areas such as the elbows and hands. DIAGNOSIS  Diagnosis is based on the symptoms described, an exam, and blood tests. Sometimes, X-rays are helpful. TREATMENT  The goals of treatment are to relieve pain, reduce inflammation, and to slow down or stop joint damage and disability. Methods vary and may include:  Maintaining a balance of rest, exercise, and proper nutrition.  Medicines:  Pain relievers (analgesics).  Corticosteroids and nonsteroidal anti-inflammatory drugs (NSAIDs) to reduce inflammation.  Disease-modifying antirheumatic drugs (DMARDs) to try to slow the course of the disease.  Biologic response modifiers to reduce inflammation and damage.  Physical therapy and occupational therapy.  Surgery for patients with severe joint damage. Joint replacement or fusing of joints may be needed.  Routine monitoring and ongoing care, such as office visits, blood and urine tests, and  X-rays. HOME CARE INSTRUCTIONS   Remain physically active and reduce activity when the disease gets worse.  Eat a well-balanced diet.  Put heat on affected joints when you wake up and before activities. Keep the heat on the affected joint for as long as directed by your caregiver.  Put ice on affected joints following activities or exercising.  Put ice in a plastic bag.  Place a towel between your skin and the bag.  Leave the ice on for 15-20 minutes, 3-4 times a day.  Take all medicines and supplements as directed by your caregiver.  Use splints as directed by your caregiver. Splints help maintain joint position and function.  Do not sleep with pillows under your knees. This may lead to spasms.  Participate in a self-management program to keep current with the latest treatment and coping skills. SEEK IMMEDIATE MEDICAL CARE IF:  You have fainting episodes.  You have periods of extreme weakness.  You rapidly develop a hot, painful joint that is more severe than usual joint aches.  You have chills.  You have a fever. MAKE SURE YOU:  Understand these instructions.  Will watch your condition.  Will get help right away if you are not doing well or get worse. FOR MORE INFORMATION  American College of Rheumatology: www.rheumatology.[place]: www.arthritis.org Document Released: 03/22/2000 Document Revised: 09/24/2011 Document Reviewed: 05/01/2011 [facility] Patient Information 2014 [place], [place].

## 2013-01-29 NOTE — Progress Notes (Signed)
  Subjective:    Patient ID: [name], male    DOB: 07/11/1945, 67 y.o.   MRN: [id number] FOOT IS BETTER AND HAS SNAPPING FEELING IN THE BALL AREA OF THE RIGHT FOOT AND HAS BEEN GOING ON FOR A MONTH NOW. HPI    Review of Systems  Constitutional: Negative.   HENT: Negative.   Eyes: Negative.   Respiratory: Negative.   Cardiovascular: Negative.   Gastrointestinal: Negative.   Endocrine: Negative.   Genitourinary: Negative.   Musculoskeletal: Positive for back pain.  Skin: Negative.   Allergic/Immunologic: Negative.   Neurological: Negative.   Hematological: Negative.   Psychiatric/Behavioral: Negative.        Objective:   Physical Exam Neurovascular status is intact and unchanged. All wounds are well healed and coapted the scars are hardly visible at this time to. No pain or discomfort. Patient does have some difficulty walking slippers the mid part of the foot radius arthritic breakdown. Recommended a croc or a stiff soled sandal or shoe for around the house as well. X-rays DOB the postop resolution status post pan met head resections and hammertoe repairs 2 through 4.       Assessment & Plan:  Assessment good postop progress following rheumatoid arthritis reconstructive surgery. Patient ready for discharge at this time. Maintain good supportive shoes at all times, followup in the future on an as-needed basis if any problems or recurrences  Harriet Masson DPM

## 2013-02-11 ENCOUNTER — Other Ambulatory Visit: Payer: Self-pay | Admitting: Neurosurgery

## 2013-02-17 ENCOUNTER — Encounter (HOSPITAL_COMMUNITY): Payer: Self-pay | Admitting: Pharmacy Technician

## 2013-02-17 ENCOUNTER — Telehealth: Payer: Self-pay | Admitting: *Deleted

## 2013-02-17 NOTE — Telephone Encounter (Signed)
Pt called to inform Dr Linda Hedges he is having surgery from T2-L1 by Dr Oretha Ellis.

## 2013-02-22 ENCOUNTER — Other Ambulatory Visit (HOSPITAL_COMMUNITY): Payer: Self-pay | Admitting: *Deleted

## 2013-02-22 NOTE — Pre-Procedure Instructions (Signed)
[name]  02/22/2013   Your procedure is scheduled on:  Tuesday, March 02, 2013 at 7:30 AM.   Report to [facility] Entrance "A" at 5:30 AM.   Call this number if you have problems the morning of surgery: [phone]   Remember:   Do not eat food or drink liquids after midnight Monday, 03/01/13.   Take these medicines the morning of surgery with A SIP OF WATER: gabapentin (NEURONTIN), OxyCODONE (OXYCONTIN), pantoprazole (PROTONIX), predniSONE (DELTASONE),  fluticasone (FLONASE), FLUoxetine (PROZAC), Aclidinium Bromide (TUDORZA PRESSAIR)   Stop all Vitamins, Herbal Medications, Aspirin and NSAIDS (Celebrex) as of today, 02/23/13.                  Do not wear jewelry.  Do not wear lotions, powders, or cologne. You may wear deodorant.             Men may shave face and neck.  Do not bring valuables to the hospital.  [facility] is not responsible                  for any belongings or valuables.               Contacts, dentures or bridgework may not be worn into surgery.  Leave suitcase in the car. After surgery it may be brought to your room.  For patients admitted to the hospital, discharge time is determined by your                treatment team.                 Special Instructions: Shower using CHG 2 nights before surgery and the night before surgery.  If you shower the day of surgery use CHG.  Use special wash - you have one bottle of CHG for all showers.  You should use approximately 1/3 of the bottle for each shower.   Please read over the following fact sheets that you were given: Pain Booklet, Coughing and Deep Breathing, Blood Transfusion Information, MRSA Information and Surgical Site Infection Prevention

## 2013-02-23 ENCOUNTER — Encounter (HOSPITAL_COMMUNITY)
Admission: RE | Admit: 2013-02-23 | Discharge: 2013-02-23 | Disposition: A | Discharge status: Home / Self Care | Payer: Medicare Other | Source: Ambulatory Visit | Attending: Neurosurgery | Admitting: Neurosurgery

## 2013-02-23 ENCOUNTER — Encounter (HOSPITAL_COMMUNITY): Payer: Self-pay

## 2013-02-23 DIAGNOSIS — Z01818 Encounter for other preprocedural examination: Secondary | ICD-10-CM | POA: Insufficient documentation

## 2013-02-23 DIAGNOSIS — Z01812 Encounter for preprocedural laboratory examination: Secondary | ICD-10-CM | POA: Insufficient documentation

## 2013-02-23 HISTORY — DX: Anxiety disorder, unspecified: F41.9

## 2013-02-23 HISTORY — DX: Depression, unspecified: F32.A

## 2013-02-23 HISTORY — DX: Major depressive disorder, single episode, unspecified: F32.9

## 2013-02-23 HISTORY — DX: Gastro-esophageal reflux disease without esophagitis: K21.9

## 2013-02-23 LAB — BASIC METABOLIC PANEL
BUN: 14 mg/dL (ref 6–23)
CO2: 29 mEq/L (ref 19–32)
Calcium: 9.1 mg/dL (ref 8.4–10.5)
Chloride: 95 mEq/L — ABNORMAL LOW (ref 96–112)
Creatinine, Ser: 0.78 mg/dL (ref 0.50–1.35)
GFR calc Af Amer: >90 mL/min (ref >90)
GFR calc non Af Amer: >90 mL/min (ref >90)
Glucose, Bld: 113 mg/dL — ABNORMAL HIGH (ref 70–99)
Potassium: 3.3 mEq/L — ABNORMAL LOW (ref 3.5–5.1)
Sodium: 134 mEq/L — ABNORMAL LOW (ref 135–145)

## 2013-02-23 LAB — CBC
HCT: 41.8 % (ref 39.0–52.0)
Hemoglobin: 14.5 g/dL (ref 13.0–17.0)
MCH: 30.9 pg (ref 26.0–34.0)
MCHC: 34.7 g/dL (ref 30.0–36.0)
MCV: 88.9 fL (ref 78.0–100.0)
Platelets: 176 10*3/uL (ref 150–400)
RBC: 4.7 MIL/uL (ref 4.22–5.81)
RDW: 13.7 % (ref 11.5–15.5)
WBC: 6.1 10*3/uL (ref 4.0–10.5)

## 2013-02-23 LAB — SURGICAL PCR SCREEN
MRSA, PCR: NEGATIVE
Staphylococcus aureus: NEGATIVE

## 2013-02-23 NOTE — Progress Notes (Signed)
Call to A. Zelenak,PAC, reviewed pt. History of cardiac problems, reviewed CXR as well, no need to repeat today. Pt. Denies SOB, difficulty breathing, any new problems related to chest, including chest pain.

## 2013-02-23 NOTE — Progress Notes (Signed)
Call to Dr. Harley Hallmark office, reported that orders need to be signed & released, spoke with Janett Billow.

## 2013-02-24 ENCOUNTER — Other Ambulatory Visit: Payer: Self-pay | Admitting: Internal Medicine

## 2013-02-24 NOTE — Progress Notes (Signed)
Anesthesia chart review: Patient is a 67 year old male scheduled for L1-S1 PLIF on 03/02/2013 by Dr. Joya Salm.   History includes nonsmoker, postoperative nausea and vomiting, diverticulosis, hypertension, osteoarthritis, hemochromatosis (last iron profile 06/22/12 WNL except transferrin was low at 177.7), CAD status post OM1 stent complicated by spiral dissection but was successfully reopened 10/15/04, paroxysmal atrial fibrillation s/p DCCV 08/13/2010, rheumatoid arthritis on Humira and prednisone, COPD, anxiety, depression, GERD, L4-5 fusion 2010, right partial TKA 2002, bilateral inguinal hernia repair, right total shoulder replacement, cataract extraction, appendectomy, ORIF left shoulder fracture 02/06/12 s/p hardware removal for early failure of plate fixation 01/31//43. PCP is Dr. Adella Hare. Patient did notify Dr. Linda Hedges' office of plans for surgery. He is also followed by neurologist Dr. Star Age following hospitalization for vertigo, encephalopathy in 07/2012 with fairly extensive work-up (see 07/14/12 discharge summary and 12/15/12 office note).  Last visit was on 12/15/12 with improvement, so he is being followed clinically.  Cardiologist is Dr. Jenkins Rouge, last visit  07/02/12. He was stable with no angina and good activity level at that time, so continued medical therapy was recommended.  Patient continued to deny chest pain at his recent PAT visit.  Nuclear stress test on 08/11/10 showed: 1. No specific features to suggest pharmacologically induced myocardial ischemia.  2. Decreased activity within the inferior wall is likely due to nonuniform soft tissue attenuation artifact.  3. Left ventricular ejection fraction equals 64%.  4. There is mild decreased motion involving the septal wall.  TEE on 08/13/10 revealed normal LV function, EF was 60%. Mitral valve was mildly thickened with trivial MR. Left atrium was mildly dilated. The atrial septum was mildly redundant without a PFO. Right-sided  cardiac chambers were normal. Aortic valve was trileaflet and normal. Left atrial appendage was small and somewhat hypercontractile. There is no spontaneous contrast or left atrial appendage clot.  Cardiac cath on 10/15/04 showed: 1. Left main: Moderate size with no significant disease.  2. LAD: Mild luminal irregularities.  3. D1: Small vessel. Mild luminal irregularities.  4. LCX: Nondominant. Mild luminal irregularities.  5. OM1: Moderate size with a 95% mid stenosis.  6. RCA: Dominant with mild luminal irregularities.  7. LV: EF 50-55% and LVEDP was 13 mmHg. He subsequently underwent placement of Taxus stents X 2 to the OM1 branch of the LCX complicated by spiral dissection but was successfully reopened.  EKG on 07/14/12 showed NSR, incomplete right BB, poor r wave progression, septal infarct (age undetermined). No significant change since 01/24/12.  CXR on 07/14/12 showed: Findings: Chronic right hemidiaphragm elevation. Streaky bibasilar opacities persist compatible with atelectasis, slightly worse in the left lower lobe. No effusion or pneumothorax. Negative for edema. Stable heart size. Bilateral shoulder arthroplasties evident.   Preoperative labs noted.   Patient has had cardiology follow-up within the past six months with no new testing recommended at that time.  No new CV symptoms reported at his PAT RN visit.  He has had recent PCP and neurology follow-up.  If no acute change then I anticipate that he can proceed as planned. Also discussed with anesthesiologist Dr. Tamala Julian.  [facility] Short Stay Center/Anesthesiology Phone [phone] 02/24/2013 2:48 PM

## 2013-02-28 ENCOUNTER — Other Ambulatory Visit: Payer: Self-pay | Admitting: Internal Medicine

## 2013-03-01 MED ORDER — VANCOMYCIN HCL IN DEXTROSE 1-5 GM/200ML-% IV SOLN
1000.0000 mg | INTRAVENOUS | Status: AC
  Administered 2013-03-02: 1000 mg via INTRAVENOUS
  Filled 2013-03-01: qty 200

## 2013-03-02 ENCOUNTER — Inpatient Hospital Stay (HOSPITAL_COMMUNITY)
Admission: RE | Admit: 2013-03-02 | Discharge: 2013-03-10 | DRG: 459 | Disposition: A | Discharge status: Skilled Nursing Facility | Payer: Medicare Other | Source: Ambulatory Visit | Attending: Neurosurgery | Admitting: Neurosurgery

## 2013-03-02 ENCOUNTER — Encounter (HOSPITAL_COMMUNITY): Payer: Medicare Other | Admitting: Vascular Surgery

## 2013-03-02 ENCOUNTER — Inpatient Hospital Stay (HOSPITAL_COMMUNITY): Payer: Medicare Other

## 2013-03-02 ENCOUNTER — Encounter (HOSPITAL_COMMUNITY): Payer: Self-pay | Admitting: *Deleted

## 2013-03-02 ENCOUNTER — Inpatient Hospital Stay (HOSPITAL_COMMUNITY): Payer: Medicare Other | Admitting: Anesthesiology

## 2013-03-02 ENCOUNTER — Encounter (HOSPITAL_COMMUNITY)
Admission: RE | Disposition: A | Discharge status: Skilled Nursing Facility | Payer: Medicare Other | Source: Ambulatory Visit | Attending: Neurosurgery

## 2013-03-02 DIAGNOSIS — K219 Gastro-esophageal reflux disease without esophagitis: Secondary | ICD-10-CM | POA: Diagnosis present

## 2013-03-02 DIAGNOSIS — I4891 Unspecified atrial fibrillation: Secondary | ICD-10-CM | POA: Diagnosis present

## 2013-03-02 DIAGNOSIS — M51369 Other intervertebral disc degeneration, lumbar region without mention of lumbar back pain or lower extremity pain: Secondary | ICD-10-CM | POA: Diagnosis present

## 2013-03-02 DIAGNOSIS — M5137 Other intervertebral disc degeneration, lumbosacral region: Principal | ICD-10-CM | POA: Diagnosis present

## 2013-03-02 DIAGNOSIS — Z9861 Coronary angioplasty status: Secondary | ICD-10-CM

## 2013-03-02 DIAGNOSIS — M4120 Other idiopathic scoliosis, site unspecified: Secondary | ICD-10-CM | POA: Diagnosis present

## 2013-03-02 DIAGNOSIS — J441 Chronic obstructive pulmonary disease with (acute) exacerbation: Secondary | ICD-10-CM

## 2013-03-02 DIAGNOSIS — M5136 Other intervertebral disc degeneration, lumbar region: Secondary | ICD-10-CM | POA: Diagnosis present

## 2013-03-02 DIAGNOSIS — I2510 Atherosclerotic heart disease of native coronary artery without angina pectoris: Secondary | ICD-10-CM | POA: Diagnosis present

## 2013-03-02 DIAGNOSIS — M1990 Unspecified osteoarthritis, unspecified site: Secondary | ICD-10-CM | POA: Diagnosis present

## 2013-03-02 DIAGNOSIS — I10 Essential (primary) hypertension: Secondary | ICD-10-CM | POA: Diagnosis present

## 2013-03-02 DIAGNOSIS — IMO0002 Reserved for concepts with insufficient information to code with codable children: Secondary | ICD-10-CM

## 2013-03-02 DIAGNOSIS — K5900 Constipation, unspecified: Secondary | ICD-10-CM | POA: Diagnosis not present

## 2013-03-02 DIAGNOSIS — F3289 Other specified depressive episodes: Secondary | ICD-10-CM | POA: Diagnosis present

## 2013-03-02 DIAGNOSIS — Z79899 Other long term (current) drug therapy: Secondary | ICD-10-CM

## 2013-03-02 DIAGNOSIS — Z96659 Presence of unspecified artificial knee joint: Secondary | ICD-10-CM

## 2013-03-02 DIAGNOSIS — J4489 Other specified chronic obstructive pulmonary disease: Secondary | ICD-10-CM | POA: Diagnosis present

## 2013-03-02 DIAGNOSIS — R578 Other shock: Secondary | ICD-10-CM | POA: Diagnosis not present

## 2013-03-02 DIAGNOSIS — M069 Rheumatoid arthritis, unspecified: Secondary | ICD-10-CM | POA: Diagnosis present

## 2013-03-02 DIAGNOSIS — F411 Generalized anxiety disorder: Secondary | ICD-10-CM | POA: Diagnosis present

## 2013-03-02 DIAGNOSIS — E876 Hypokalemia: Secondary | ICD-10-CM | POA: Diagnosis present

## 2013-03-02 DIAGNOSIS — J449 Chronic obstructive pulmonary disease, unspecified: Secondary | ICD-10-CM | POA: Diagnosis present

## 2013-03-02 DIAGNOSIS — Z981 Arthrodesis status: Secondary | ICD-10-CM

## 2013-03-02 DIAGNOSIS — F329 Major depressive disorder, single episode, unspecified: Secondary | ICD-10-CM | POA: Diagnosis present

## 2013-03-02 DIAGNOSIS — R579 Shock, unspecified: Secondary | ICD-10-CM

## 2013-03-02 DIAGNOSIS — M5126 Other intervertebral disc displacement, lumbar region: Secondary | ICD-10-CM | POA: Diagnosis present

## 2013-03-02 DIAGNOSIS — Z7982 Long term (current) use of aspirin: Secondary | ICD-10-CM

## 2013-03-02 DIAGNOSIS — M51379 Other intervertebral disc degeneration, lumbosacral region without mention of lumbar back pain or lower extremity pain: Principal | ICD-10-CM | POA: Diagnosis present

## 2013-03-02 HISTORY — PX: POSTERIOR LUMBAR FUSION 4 LEVEL: SHX6037

## 2013-03-02 LAB — POCT I-STAT 4, (NA,K, GLUC, HGB,HCT)
Glucose, Bld: 157 mg/dL — ABNORMAL HIGH (ref 70–99)
Glucose, Bld: 159 mg/dL — ABNORMAL HIGH (ref 70–99)
HCT: 33 % — ABNORMAL LOW (ref 39.0–52.0)
HCT: 32 % — ABNORMAL LOW (ref 39.0–52.0)
Hemoglobin: 11.2 g/dL — ABNORMAL LOW (ref 13.0–17.0)
Hemoglobin: 10.9 g/dL — ABNORMAL LOW (ref 13.0–17.0)
Potassium: 3.3 mEq/L — ABNORMAL LOW (ref 3.5–5.1)
Potassium: 3.4 mEq/L — ABNORMAL LOW (ref 3.5–5.1)
Sodium: 136 mEq/L (ref 135–145)
Sodium: 136 mEq/L (ref 135–145)

## 2013-03-02 LAB — CBC
HCT: 29.7 % — ABNORMAL LOW (ref 39.0–52.0)
Hemoglobin: 10.5 g/dL — ABNORMAL LOW (ref 13.0–17.0)
MCH: 31.7 pg (ref 26.0–34.0)
MCHC: 35.4 g/dL (ref 30.0–36.0)
MCV: 89.7 fL (ref 78.0–100.0)
Platelets: 149 10*3/uL — ABNORMAL LOW (ref 150–400)
RBC: 3.31 MIL/uL — ABNORMAL LOW (ref 4.22–5.81)
RDW: 13.8 % (ref 11.5–15.5)
WBC: 11.8 10*3/uL — ABNORMAL HIGH (ref 4.0–10.5)

## 2013-03-02 LAB — BASIC METABOLIC PANEL
BUN: 13 mg/dL (ref 6–23)
CO2: 26 mEq/L (ref 19–32)
Calcium: 7.3 mg/dL — ABNORMAL LOW (ref 8.4–10.5)
Chloride: 101 mEq/L (ref 96–112)
Creatinine, Ser: 0.64 mg/dL (ref 0.50–1.35)
GFR calc Af Amer: >90 mL/min (ref >90)
GFR calc non Af Amer: >90 mL/min (ref >90)
Glucose, Bld: 155 mg/dL — ABNORMAL HIGH (ref 70–99)
Potassium: 3.4 mEq/L — ABNORMAL LOW (ref 3.5–5.1)
Sodium: 136 mEq/L (ref 135–145)

## 2013-03-02 LAB — PREPARE RBC (CROSSMATCH)

## 2013-03-02 SURGERY — POSTERIOR LUMBAR FUSION 4 LEVEL
Anesthesia: General | Site: Back | Wound class: Clean

## 2013-03-02 MED ORDER — THROMBIN 5000 UNITS EX SOLR
OROMUCOSAL | Status: DC | PRN
  Administered 2013-03-02: 08:00:00 via TOPICAL

## 2013-03-02 MED ORDER — PROPOFOL 10 MG/ML IV BOLUS
INTRAVENOUS | Status: DC | PRN
  Administered 2013-03-02: 180 mg via INTRAVENOUS

## 2013-03-02 MED ORDER — LOSARTAN POTASSIUM 50 MG PO TABS
50.0000 mg | ORAL_TABLET | Freq: Every day | ORAL | Status: DC
  Filled 2013-03-02 (×2): qty 1

## 2013-03-02 MED ORDER — ASPIRIN EC 81 MG PO TBEC
81.0000 mg | DELAYED_RELEASE_TABLET | Freq: Every day | ORAL | Status: DC
  Administered 2013-03-03 – 2013-03-10 (×8): 81 mg via ORAL
  Filled 2013-03-02 (×8): qty 1

## 2013-03-02 MED ORDER — HYDROMORPHONE 0.3 MG/ML IV SOLN
INTRAVENOUS | Status: AC
  Administered 2013-03-02: 19:00:00
  Administered 2013-03-03: 2.1 mg
  Administered 2013-03-03: 1.2 mg
  Filled 2013-03-02: qty 25

## 2013-03-02 MED ORDER — HYDROMORPHONE HCL PF 1 MG/ML IJ SOLN
0.5000 mg | INTRAMUSCULAR | Status: DC | PRN
Start: 2013-03-02 — End: 2013-03-06
  Administered 2013-03-03 – 2013-03-06 (×7): 1 mg via INTRAVENOUS
  Filled 2013-03-02 (×8): qty 1

## 2013-03-02 MED ORDER — DIAZEPAM 5 MG PO TABS
10.0000 mg | ORAL_TABLET | Freq: Four times a day (QID) | ORAL | Status: DC | PRN
  Administered 2013-03-02: 10 mg via ORAL

## 2013-03-02 MED ORDER — DIPHENHYDRAMINE HCL 50 MG/ML IJ SOLN: 12.5000 mg | Freq: Four times a day (QID) | INTRAMUSCULAR | Status: DC | PRN

## 2013-03-02 MED ORDER — HYDROMORPHONE 0.3 MG/ML IV SOLN
INTRAVENOUS | Status: DC
  Administered 2013-03-02: 15:00:00 via INTRAVENOUS

## 2013-03-02 MED ORDER — ROFLUMILAST 500 MCG PO TABS
500.0000 ug | ORAL_TABLET | Freq: Every day | ORAL | Status: DC
  Administered 2013-03-02 – 2013-03-10 (×9): 500 ug via ORAL
  Filled 2013-03-02 (×10): qty 1

## 2013-03-02 MED ORDER — SODIUM CHLORIDE 0.9 % IV SOLN: INTRAVENOUS | Status: DC

## 2013-03-02 MED ORDER — DIAZEPAM 5 MG PO TABS
5.0000 mg | ORAL_TABLET | Freq: Four times a day (QID) | ORAL | Status: DC | PRN
  Administered 2013-03-03: 5 mg via ORAL
  Filled 2013-03-02 (×2): qty 1

## 2013-03-02 MED ORDER — OXYCODONE-ACETAMINOPHEN 5-325 MG PO TABS
ORAL_TABLET | ORAL | Status: AC
  Filled 2013-03-02: qty 2

## 2013-03-02 MED ORDER — SIMVASTATIN 40 MG PO TABS
40.0000 mg | ORAL_TABLET | ORAL | Status: DC
  Administered 2013-03-03 – 2013-03-10 (×8): 40 mg via ORAL
  Filled 2013-03-02 (×8): qty 1

## 2013-03-02 MED ORDER — THROMBIN 20000 UNITS EX SOLR
CUTANEOUS | Status: DC | PRN
  Administered 2013-03-02: 08:00:00 via TOPICAL

## 2013-03-02 MED ORDER — HYDROCHLOROTHIAZIDE 25 MG PO TABS
25.0000 mg | ORAL_TABLET | Freq: Every day | ORAL | Status: DC
  Filled 2013-03-02 (×2): qty 1

## 2013-03-02 MED ORDER — FENTANYL CITRATE 0.05 MG/ML IJ SOLN: 50.0000 ug | Freq: Once | INTRAMUSCULAR | Status: DC

## 2013-03-02 MED ORDER — OXYCODONE HCL 5 MG PO TABS
10.0000 mg | ORAL_TABLET | ORAL | Status: DC | PRN
  Administered 2013-03-03 – 2013-03-06 (×13): 10 mg via ORAL
  Filled 2013-03-02 (×13): qty 2

## 2013-03-02 MED ORDER — 0.9 % SODIUM CHLORIDE (POUR BTL) OPTIME
TOPICAL | Status: DC | PRN
  Administered 2013-03-02: 1000 mL

## 2013-03-02 MED ORDER — ONDANSETRON HCL 4 MG/2ML IJ SOLN: 4.0000 mg | INTRAMUSCULAR | Status: DC | PRN

## 2013-03-02 MED ORDER — ALBUMIN HUMAN 5 % IV SOLN
INTRAVENOUS | Status: AC
  Filled 2013-03-02: qty 250

## 2013-03-02 MED ORDER — SODIUM CHLORIDE 0.9 % IJ SOLN
3.0000 mL | Freq: Two times a day (BID) | INTRAMUSCULAR | Status: DC
  Administered 2013-03-02 – 2013-03-09 (×12): 3 mL via INTRAVENOUS

## 2013-03-02 MED ORDER — SODIUM CHLORIDE 0.9 % IV SOLN: 250.0000 mL | INTRAVENOUS | Status: DC

## 2013-03-02 MED ORDER — MIDAZOLAM HCL 5 MG/5ML IJ SOLN
INTRAMUSCULAR | Status: DC | PRN
  Administered 2013-03-02: 2 mg via INTRAVENOUS

## 2013-03-02 MED ORDER — ALBUMIN HUMAN 5 % IV SOLN
12.5000 g | Freq: Once | INTRAVENOUS | Status: AC
  Administered 2013-03-02: 12.5 g via INTRAVENOUS

## 2013-03-02 MED ORDER — NEOSTIGMINE METHYLSULFATE 1 MG/ML IJ SOLN
INTRAMUSCULAR | Status: DC | PRN
  Administered 2013-03-02: 4 mg via INTRAVENOUS

## 2013-03-02 MED ORDER — FLUOXETINE HCL 20 MG PO CAPS
40.0000 mg | ORAL_CAPSULE | Freq: Two times a day (BID) | ORAL | Status: DC
  Administered 2013-03-02 – 2013-03-10 (×16): 40 mg via ORAL
  Filled 2013-03-02 (×17): qty 2

## 2013-03-02 MED ORDER — COLCHICINE 0.6 MG PO TABS
0.6000 mg | ORAL_TABLET | Freq: Two times a day (BID) | ORAL | Status: DC
  Administered 2013-03-02 – 2013-03-10 (×16): 0.6 mg via ORAL
  Filled 2013-03-02 (×18): qty 1

## 2013-03-02 MED ORDER — LOSARTAN POTASSIUM 50 MG PO TABS: 50.0000 mg | ORAL_TABLET | Freq: Every day | ORAL | Status: DC

## 2013-03-02 MED ORDER — MENTHOL 3 MG MT LOZG: 1.0000 | LOZENGE | OROMUCOSAL | Status: DC | PRN

## 2013-03-02 MED ORDER — GLYCOPYRROLATE 0.2 MG/ML IJ SOLN
INTRAMUSCULAR | Status: DC | PRN
  Administered 2013-03-02: 0.6 mg via INTRAVENOUS

## 2013-03-02 MED ORDER — FUROSEMIDE 20 MG PO TABS
20.0000 mg | ORAL_TABLET | Freq: Every day | ORAL | Status: DC
  Filled 2013-03-02: qty 1

## 2013-03-02 MED ORDER — ACETAMINOPHEN 650 MG RE SUPP: 650.0000 mg | RECTAL | Status: DC | PRN

## 2013-03-02 MED ORDER — TRAZODONE HCL 50 MG PO TABS
50.0000 mg | ORAL_TABLET | Freq: Every evening | ORAL | Status: DC | PRN
  Administered 2013-03-04 – 2013-03-05 (×2): 50 mg via ORAL
  Filled 2013-03-02 (×3): qty 1

## 2013-03-02 MED ORDER — GABAPENTIN 300 MG PO CAPS: 300.0000 mg | ORAL_CAPSULE | Freq: Four times a day (QID) | ORAL | Status: DC

## 2013-03-02 MED ORDER — NALOXONE HCL 0.4 MG/ML IJ SOLN: 0.4000 mg | INTRAMUSCULAR | Status: DC | PRN

## 2013-03-02 MED ORDER — HYDROMORPHONE HCL PF 1 MG/ML IJ SOLN
INTRAMUSCULAR | Status: AC
  Filled 2013-03-02: qty 1

## 2013-03-02 MED ORDER — TRAZODONE HCL 100 MG PO TABS
100.0000 mg | ORAL_TABLET | Freq: Every day | ORAL | Status: DC
  Administered 2013-03-02 – 2013-03-09 (×8): 100 mg via ORAL
  Filled 2013-03-02 (×10): qty 1

## 2013-03-02 MED ORDER — LIDOCAINE HCL (CARDIAC) 20 MG/ML IV SOLN
INTRAVENOUS | Status: DC | PRN
  Administered 2013-03-02: 100 mg via INTRAVENOUS

## 2013-03-02 MED ORDER — DIAZEPAM 5 MG PO TABS
ORAL_TABLET | ORAL | Status: AC
  Filled 2013-03-02: qty 2

## 2013-03-02 MED ORDER — ZOLPIDEM TARTRATE 5 MG PO TABS
5.0000 mg | ORAL_TABLET | Freq: Every evening | ORAL | Status: DC | PRN
  Administered 2013-03-06 – 2013-03-08 (×2): 5 mg via ORAL
  Filled 2013-03-02 (×3): qty 1

## 2013-03-02 MED ORDER — DIPHENHYDRAMINE HCL 12.5 MG/5ML PO ELIX
12.5000 mg | ORAL_SOLUTION | Freq: Four times a day (QID) | ORAL | Status: DC | PRN
  Filled 2013-03-02: qty 5

## 2013-03-02 MED ORDER — ROCURONIUM BROMIDE 100 MG/10ML IV SOLN
INTRAVENOUS | Status: DC | PRN
  Administered 2013-03-02: 50 mg via INTRAVENOUS
  Administered 2013-03-02 (×6): 10 mg via INTRAVENOUS

## 2013-03-02 MED ORDER — HYDROMORPHONE HCL PF 1 MG/ML IJ SOLN
0.2500 mg | INTRAMUSCULAR | Status: DC | PRN
  Administered 2013-03-02 (×4): 0.5 mg via INTRAVENOUS

## 2013-03-02 MED ORDER — OXYCODONE-ACETAMINOPHEN 5-325 MG PO TABS
2.0000 | ORAL_TABLET | ORAL | Status: DC | PRN
  Administered 2013-03-02 – 2013-03-06 (×2): 2 via ORAL
  Filled 2013-03-02: qty 2

## 2013-03-02 MED ORDER — ALPRAZOLAM 0.5 MG PO TABS
0.5000 mg | ORAL_TABLET | Freq: Three times a day (TID) | ORAL | Status: DC | PRN
  Administered 2013-03-06 – 2013-03-07 (×4): 0.5 mg via ORAL
  Filled 2013-03-02 (×4): qty 1

## 2013-03-02 MED ORDER — ALBUMIN HUMAN 5 % IV SOLN
INTRAVENOUS | Status: DC | PRN
  Administered 2013-03-02 (×2): via INTRAVENOUS

## 2013-03-02 MED ORDER — EPHEDRINE SULFATE 50 MG/ML IJ SOLN
INTRAMUSCULAR | Status: DC | PRN
  Administered 2013-03-02 (×5): 10 mg via INTRAVENOUS

## 2013-03-02 MED ORDER — LACTATED RINGERS IV SOLN
INTRAVENOUS | Status: DC | PRN
  Administered 2013-03-02 (×4): via INTRAVENOUS

## 2013-03-02 MED ORDER — ACETAMINOPHEN 325 MG PO TABS: 650.0000 mg | ORAL_TABLET | ORAL | Status: DC | PRN

## 2013-03-02 MED ORDER — DIAZEPAM 5 MG/ML IJ SOLN
5.0000 mg | Freq: Once | INTRAMUSCULAR | Status: AC
  Administered 2013-03-02: 5 mg via INTRAVENOUS

## 2013-03-02 MED ORDER — SODIUM CHLORIDE 0.9 % IJ SOLN: 3.0000 mL | INTRAMUSCULAR | Status: DC | PRN

## 2013-03-02 MED ORDER — SODIUM CHLORIDE 0.9 % IV SOLN
INTRAVENOUS | Status: DC
  Administered 2013-03-02: 75 mL/h via INTRAVENOUS
  Administered 2013-03-03 – 2013-03-04 (×2): via INTRAVENOUS

## 2013-03-02 MED ORDER — DIAZEPAM 5 MG/ML IJ SOLN
INTRAMUSCULAR | Status: AC
  Filled 2013-03-02: qty 2

## 2013-03-02 MED ORDER — BUPIVACAINE LIPOSOME 1.3 % IJ SUSP
20.0000 mL | Freq: Once | INTRAMUSCULAR | Status: DC
  Filled 2013-03-02: qty 20

## 2013-03-02 MED ORDER — PROMETHAZINE HCL 25 MG/ML IJ SOLN: 6.2500 mg | INTRAMUSCULAR | Status: DC | PRN

## 2013-03-02 MED ORDER — ONDANSETRON HCL 4 MG/2ML IJ SOLN
INTRAMUSCULAR | Status: DC | PRN
  Administered 2013-03-02: 4 mg via INTRAVENOUS

## 2013-03-02 MED ORDER — PHENOL 1.4 % MT LIQD: 1.0000 | OROMUCOSAL | Status: DC | PRN

## 2013-03-02 MED ORDER — TIOTROPIUM BROMIDE MONOHYDRATE 18 MCG IN CAPS
18.0000 ug | ORAL_CAPSULE | Freq: Every day | RESPIRATORY_TRACT | Status: DC
Start: 2013-03-02 — End: 2013-03-03
  Administered 2013-03-02: 18 ug via RESPIRATORY_TRACT
  Filled 2013-03-02: qty 5

## 2013-03-02 MED ORDER — PANTOPRAZOLE SODIUM 40 MG PO TBEC
40.0000 mg | DELAYED_RELEASE_TABLET | Freq: Two times a day (BID) | ORAL | Status: DC
  Administered 2013-03-02 – 2013-03-10 (×16): 40 mg via ORAL
  Filled 2013-03-02 (×14): qty 1

## 2013-03-02 MED ORDER — SODIUM CHLORIDE 0.9 % IV SOLN
INTRAVENOUS | Status: DC | PRN
  Administered 2013-03-02: 10:00:00 via INTRAVENOUS

## 2013-03-02 MED ORDER — FLEET ENEMA 7-19 GM/118ML RE ENEM
1.0000 | ENEMA | Freq: Once | RECTAL | Status: AC | PRN
  Filled 2013-03-02: qty 1

## 2013-03-02 MED ORDER — ONDANSETRON HCL 4 MG/2ML IJ SOLN: 4.0000 mg | Freq: Four times a day (QID) | INTRAMUSCULAR | Status: DC | PRN

## 2013-03-02 MED ORDER — POLYETHYLENE GLYCOL 3350 17 G PO PACK
17.0000 g | PACK | Freq: Every day | ORAL | Status: DC | PRN
  Administered 2013-03-03 – 2013-03-10 (×4): 17 g via ORAL
  Filled 2013-03-02 (×4): qty 1

## 2013-03-02 MED ORDER — PREDNISONE 5 MG PO TABS
5.0000 mg | ORAL_TABLET | Freq: Every day | ORAL | Status: DC
  Administered 2013-03-03: 5 mg via ORAL
  Filled 2013-03-02 (×2): qty 1

## 2013-03-02 MED ORDER — MIDAZOLAM HCL 2 MG/2ML IJ SOLN: 1.0000 mg | INTRAMUSCULAR | Status: DC | PRN

## 2013-03-02 MED ORDER — SODIUM CHLORIDE 0.9 % IJ SOLN: 9.0000 mL | INTRAMUSCULAR | Status: DC | PRN

## 2013-03-02 MED ORDER — GABAPENTIN 300 MG PO CAPS
300.0000 mg | ORAL_CAPSULE | ORAL | Status: DC
  Administered 2013-03-03 – 2013-03-10 (×15): 300 mg via ORAL
  Filled 2013-03-02 (×18): qty 1

## 2013-03-02 MED ORDER — GABAPENTIN 300 MG PO CAPS
600.0000 mg | ORAL_CAPSULE | ORAL | Status: DC
  Administered 2013-03-02 – 2013-03-10 (×16): 600 mg via ORAL
  Filled 2013-03-02 (×20): qty 2

## 2013-03-02 MED ORDER — VANCOMYCIN HCL IN DEXTROSE 1-5 GM/200ML-% IV SOLN
1000.0000 mg | Freq: Two times a day (BID) | INTRAVENOUS | Status: DC
  Administered 2013-03-02 – 2013-03-08 (×12): 1000 mg via INTRAVENOUS
  Filled 2013-03-02 (×14): qty 200

## 2013-03-02 MED ORDER — BACITRACIN ZINC 500 UNIT/GM EX OINT
TOPICAL_OINTMENT | CUTANEOUS | Status: DC | PRN
  Administered 2013-03-02: 1 via TOPICAL

## 2013-03-02 MED ORDER — FENTANYL CITRATE 0.05 MG/ML IJ SOLN
INTRAMUSCULAR | Status: DC | PRN
  Administered 2013-03-02 (×2): 50 ug via INTRAVENOUS
  Administered 2013-03-02: 100 ug via INTRAVENOUS
  Administered 2013-03-02 (×3): 50 ug via INTRAVENOUS
  Administered 2013-03-02: 100 ug via INTRAVENOUS
  Administered 2013-03-02 (×6): 50 ug via INTRAVENOUS

## 2013-03-02 MED ORDER — ZOLPIDEM TARTRATE 5 MG PO TABS: 10.0000 mg | ORAL_TABLET | Freq: Every evening | ORAL | Status: DC | PRN

## 2013-03-02 MED ORDER — SODIUM CHLORIDE 0.9 % IJ SOLN: 3.0000 mL | Freq: Two times a day (BID) | INTRAMUSCULAR | Status: DC

## 2013-03-02 MED ORDER — ONDANSETRON HCL 4 MG/2ML IJ SOLN
4.0000 mg | INTRAMUSCULAR | Status: DC | PRN
  Administered 2013-03-03 – 2013-03-09 (×9): 4 mg via INTRAVENOUS
  Filled 2013-03-02 (×9): qty 2

## 2013-03-02 MED ORDER — ASPIRIN 81 MG PO TBEC: 81.0000 mg | DELAYED_RELEASE_TABLET | Freq: Every morning | ORAL | Status: DC

## 2013-03-02 MED ORDER — LACTATED RINGERS IV SOLN
INTRAVENOUS | Status: DC | PRN
  Administered 2013-03-02: 11:00:00 via INTRAVENOUS

## 2013-03-02 MED ORDER — BUPIVACAINE LIPOSOME 1.3 % IJ SUSP
INTRAMUSCULAR | Status: DC | PRN
  Administered 2013-03-02: 13:00:00

## 2013-03-02 MED ORDER — PHENYLEPHRINE HCL 10 MG/ML IJ SOLN
10.0000 mg | INTRAVENOUS | Status: DC | PRN
  Administered 2013-03-02: 40 ug/min via INTRAVENOUS

## 2013-03-02 MED ORDER — SODIUM CHLORIDE 0.9 % IJ SOLN
3.0000 mL | INTRAMUSCULAR | Status: DC | PRN
  Administered 2013-03-07: 3 mL via INTRAVENOUS

## 2013-03-02 SURGICAL SUPPLY — 78 items
APL SKNCLS STERI-STRIP NONHPOA (GAUZE/BANDAGES/DRESSINGS) ×1
APL SRG 60D 8 XTD TIP BNDBL (TIP) ×1
BENZOIN TINCTURE PRP APPL 2/3 (GAUZE/BANDAGES/DRESSINGS) ×2 IMPLANT
BLADE SURG ROTATE 9660 (MISCELLANEOUS) IMPLANT
BUR ACORN 6.0 (BURR) ×2 IMPLANT
BUR MATCHSTICK NEURO 3.0 LAGG (BURR) ×3 IMPLANT
CANISTER SUCT 3000ML (MISCELLANEOUS) ×2 IMPLANT
CAP REVERE LOCKING (Cap) ×16 IMPLANT
CONT SPEC 4OZ CLIKSEAL STRL BL (MISCELLANEOUS) ×2 IMPLANT
COVER BACK TABLE 24X17X13 BIG (DRAPES) IMPLANT
COVER TABLE BACK 60X90 (DRAPES) ×2 IMPLANT
CROSSLINK 58-70MM (Connector) ×2 IMPLANT
DRAPE C-ARM 42X72 X-RAY (DRAPES) ×4 IMPLANT
DRAPE LAPAROTOMY 100X72X124 (DRAPES) ×2 IMPLANT
DRAPE POUCH INSTRU U-SHP 10X18 (DRAPES) ×2 IMPLANT
DRESSING TELFA 8X3 (GAUZE/BANDAGES/DRESSINGS) ×1 IMPLANT
DRSG PAD ABDOMINAL 8X10 ST (GAUZE/BANDAGES/DRESSINGS) IMPLANT
DURAPREP 26ML APPLICATOR (WOUND CARE) ×2 IMPLANT
DURASEAL APPLICATOR TIP (TIP) ×1 IMPLANT
DURASEAL SPINE SEALANT 3ML (MISCELLANEOUS) ×1 IMPLANT
ELECT BLADE 4.0 EZ CLEAN MEGAD (MISCELLANEOUS) ×2
ELECT REM PT RETURN 9FT ADLT (ELECTROSURGICAL) ×2
ELECTRODE BLDE 4.0 EZ CLN MEGD (MISCELLANEOUS) ×1 IMPLANT
ELECTRODE REM PT RTRN 9FT ADLT (ELECTROSURGICAL) ×1 IMPLANT
EVACUATOR 1/8 PVC DRAIN (DRAIN) IMPLANT
EVACUATOR 3/16  PVC DRAIN (DRAIN) ×1
EVACUATOR 3/16 PVC DRAIN (DRAIN) ×1 IMPLANT
GAUZE SPONGE 4X4 16PLY XRAY LF (GAUZE/BANDAGES/DRESSINGS) ×3 IMPLANT
GLOVE BIO SURGEON STRL SZ 6.5 (GLOVE) ×2 IMPLANT
GLOVE BIOGEL M 8.0 STRL (GLOVE) ×2 IMPLANT
GLOVE EXAM NITRILE LRG STRL (GLOVE) ×2 IMPLANT
GLOVE EXAM NITRILE MD LF STRL (GLOVE) IMPLANT
GLOVE EXAM NITRILE XL STR (GLOVE) IMPLANT
GLOVE EXAM NITRILE XS STR PU (GLOVE) IMPLANT
GLOVE INDICATOR 7.0 STRL GRN (GLOVE) ×1 IMPLANT
GOWN BRE IMP SLV AUR LG STRL (GOWN DISPOSABLE) ×4 IMPLANT
GOWN BRE IMP SLV AUR XL STRL (GOWN DISPOSABLE) ×2 IMPLANT
GOWN STRL REIN 2XL LVL4 (GOWN DISPOSABLE) IMPLANT
KIT BASIN OR (CUSTOM PROCEDURE TRAY) ×2 IMPLANT
KIT INFUSE LRG II (Orthopedic Implant) ×2 IMPLANT
KIT ROOM TURNOVER OR (KITS) ×2 IMPLANT
MARKER SKIN DUAL TIP RULER LAB (MISCELLANEOUS) ×1 IMPLANT
MILL MEDIUM DISP (BLADE) ×1 IMPLANT
NDL HYPO 18GX1.5 BLUNT FILL (NEEDLE) IMPLANT
NDL HYPO 25X1 1.5 SAFETY (NEEDLE) IMPLANT
NEEDLE HYPO 18GX1.5 BLUNT FILL (NEEDLE) IMPLANT
NEEDLE HYPO 21X1.5 SAFETY (NEEDLE) ×2 IMPLANT
NEEDLE HYPO 25X1 1.5 SAFETY (NEEDLE) IMPLANT
NS IRRIG 1000ML POUR BTL (IV SOLUTION) ×2 IMPLANT
PACK LAMINECTOMY NEURO (CUSTOM PROCEDURE TRAY) ×2 IMPLANT
PAD ARMBOARD 7.5X6 YLW CONV (MISCELLANEOUS) ×6 IMPLANT
PATTIES SURGICAL .5 X1 (DISPOSABLE) ×2 IMPLANT
PATTIES SURGICAL .5 X3 (DISPOSABLE) IMPLANT
PATTIES SURGICAL 1X1 (DISPOSABLE) ×1 IMPLANT
ROD SPINE HEX 6.35X200 (Rod) ×4 IMPLANT
SCREW 5.5X55 (Screw) ×4 IMPLANT
SCREW REVERE 5.5X45 (Screw) ×12 IMPLANT
SCREW SET BREAK OFF (Screw) ×4 IMPLANT
SPONGE GAUZE 4X4 12PLY (GAUZE/BANDAGES/DRESSINGS) ×2 IMPLANT
SPONGE LAP 4X18 X RAY DECT (DISPOSABLE) IMPLANT
SPONGE NEURO XRAY DETECT 1X3 (DISPOSABLE) IMPLANT
SPONGE SURGIFOAM ABS GEL 100 (HEMOSTASIS) ×2 IMPLANT
STRIP CLOSURE SKIN 1/2X4 (GAUZE/BANDAGES/DRESSINGS) ×2 IMPLANT
SUT ETHILON 3 0 FSL (SUTURE) ×1 IMPLANT
SUT NURALON 4 0 TR CR/8 (SUTURE) ×1 IMPLANT
SUT VIC AB 1 CT1 18XBRD ANBCTR (SUTURE) ×3 IMPLANT
SUT VIC AB 1 CT1 8-18 (SUTURE) ×6
SUT VIC AB 2-0 CP2 18 (SUTURE) ×4 IMPLANT
SUT VIC AB 3-0 SH 8-18 (SUTURE) ×4 IMPLANT
SYR 20CC LL (SYRINGE) IMPLANT
SYR 20ML ECCENTRIC (SYRINGE) ×2 IMPLANT
SYR 5ML LL (SYRINGE) IMPLANT
TAPE CLOTH SURG 4X10 WHT LF (GAUZE/BANDAGES/DRESSINGS) ×1 IMPLANT
TOWEL OR 17X24 6PK STRL BLUE (TOWEL DISPOSABLE) ×2 IMPLANT
TOWEL OR 17X26 10 PK STRL BLUE (TOWEL DISPOSABLE) ×2 IMPLANT
TRAY FOLEY CATH 14FRSI W/METER (CATHETERS) ×2 IMPLANT
TRAY FOLEY CATH 16FRSI W/METER (SET/KITS/TRAYS/PACK) ×2 IMPLANT
WATER STERILE IRR 1000ML POUR (IV SOLUTION) ×2 IMPLANT

## 2013-03-02 NOTE — Progress Notes (Signed)
Pt. In bigeminy, Dr Chriss Driver aware

## 2013-03-02 NOTE — H&P (Signed)
[name] is an 67 y.o. male.   Chief Complaint: lbp HPI: patient seen in my office along with his wife for along standing lumbar pain with radiation to both legs right worse than left, no better with conservative treatment.had a l45 fusion many years ago  Past Medical History  Diagnosis Date  . Hx of colonic polyps   . Diverticulosis   . Hypertension   . Osteoarthritis   . 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 CAD (coronary artery disease)     minimal coronary plaque in the LAD and right coronary system. 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 7-06. Last nuclear stress 11-17-06 fixed anterior/ inferior defect, no inducible ischemia, EF 81%  . Allergic rhinitis   . Hx of colonoscopy   . RA (rheumatoid arthritis)   . COPD (chronic obstructive pulmonary disease)   . PONV (postoperative nausea and vomiting)   . Dysrhythmia 01-24-12    past hx. A.Fib x1 episode-responded to med.  . Depression   . Anxiety     pt. feels its related to the medicine that he takes   . GERD (gastroesophageal reflux disease)     Past Surgical History  Procedure Laterality Date  . Appendectomy    . Inguinal hernia repair  2008    Right, left remotely  . Total knee arthroplasty      right knee partial 2002  . Tonsillectomy    . Shoulder preplacement  2008    left, partial  . Ptca w/ coated stent lad      Cx secondary to tear  . Foot surgery  11-08    for removal of bone spurs- right foot  . Reconstruction of ankle  6-09    Right- [facility]  . L4-5 diskectomy w/ fusion, cage placement and rods  12-10    Hazelee Harbold  . Partial knee arthroplasty      left  . Total shoulder replacement      right  . Joint replacement    . Cataract extraction, bilateral  01-24-12    Bilateral  . Orif shoulder fracture  02/06/2012    Procedure: OPEN REDUCTION INTERNAL FIXATION (ORIF) SHOULDER FRACTURE;   Surgeon: Nita Sells, MD;  Location: [facility] OR[facility];  Service: Orthopedics;  Laterality: Left;  ORIF of a Left Shoulder Fracture with  Iliac Crest Bone Graft aspiration   . Harvest bone graft  02/06/2012    Procedure: HARVEST ILIAC BONE GRAFT;  Surgeon: Nita Sells, MD;  Location: [facility];  Service: Orthopedics;;  bone marrow aspirqation   . Hardware removal  03/09/2012    Procedure: HARDWARE REMOVAL;  Surgeon: Nita Sells, MD;  Location: [place];  Service: Orthopedics;  Laterality: Left;  Hardware Removal from Left Shoulder  . Cardiac catheterization  2006    coronary stents- no problems  . Shoulder arthroscopy  2013  . Carpal tunnel release      R hand  . Ankle surgery Right 2008    at [facility], replacement   . Dg toes*r*      toe surgery- 07/2012    Family History  Problem Relation Age of Onset  . Uterine cancer Mother     survivor  . Macular degeneration Mother   . Other Mother     ankle edema  . Lung cancer Mother   . Cancer Mother  ovarian, lung  . Coronary artery disease Father   . Hypertension Father   . Prostate cancer Father   . Colon polyps Father   . Cancer Father     prostate  . Thyroid disease Sister   . Heart attack Brother   . Hyperlipidemia Brother   . Other Brother     Schizophrenic  . Diabetes Neg Hx   . Colon cancer Neg Hx   . Esophageal cancer Neg Hx   . Rectal cancer Neg Hx   . Stomach cancer Neg Hx   . Coronary artery disease Maternal Aunt   . Heart attack Maternal Aunt   . Rheum arthritis Sister   . Hemochromatosis Sister    Social History:  reports that he has never smoked. He has never used smokeless tobacco. He reports that he does not drink alcohol or use illicit drugs.  Allergies:  Allergies  Allergen Reactions  . Morphine And Related Shortness Of Breath, Nausea And Vomiting and Swelling  . Penicillins Hives, Shortness Of Breath and Rash    REACTION: hives, breathing problems  . Percocet  [Oxycodone-Acetaminophen] Anxiety    Medications Prior to Admission  Medication Sig Dispense Refill  . Aclidinium Bromide (TUDORZA PRESSAIR) 400 MCG/ACT AEPB Inhale 1 puff into the lungs 2 (two) times daily.  1 each  11  . adalimumab (HUMIRA) 40 MG/0.8ML injection Inject 40 mg into the skin every 14 (fourteen) days.      . ALPRAZolam (XANAX) 0.5 MG tablet Take 0.5 mg by mouth 3 (three) times daily as needed for anxiety or sleep.      [name] aspirin (ASPIRIN EC) 81 MG EC tablet Take 81 mg by mouth every morning.       . celecoxib (CELEBREX) 200 MG capsule Take 200 mg by mouth 2 (two) times daily.      . cholecalciferol (VITAMIN D) 1000 UNITS tablet Take 1 tablet (1,000 Units total) by mouth daily.  100 tablet  3  . COLCRYS 0.6 MG tablet TAKE 1 TABLET (0.6 MG TOTAL) BY MOUTH 2 (TWO) TIMES DAILY.  60 tablet  5  . DALIRESP 500 MCG TABS tablet TAKE 1 TABLET (500 MCG TOTAL) BY MOUTH DAILY.  30 tablet  5  . FLUoxetine (PROZAC) 40 MG capsule Take 40 mg by mouth 2 (two) times daily.       . fluticasone (FLONASE) 50 MCG/ACT nasal spray Place 2 sprays into both nostrils daily.      . furosemide (LASIX) 20 MG tablet Take 20 mg by mouth daily.      [name] gabapentin (NEURONTIN) 300 MG capsule Take 300 mg by mouth 4 (four) times daily. 600 mg every morning, 300 mg at noon, 600 mg at bedtime, and 300 mg at 1:00 am.      . hydrochlorothiazide (HYDRODIURIL) 25 MG tablet Take 25 mg by mouth daily before breakfast.       . HYDROmorphone (DILAUDID) 2 MG tablet Take 1 tablet (2 mg total) by mouth every 4 (four) hours as needed for pain. May take up to 2 tablets if needed. Fill on or after 03/04/13.  60 tablet  0  . losartan (COZAAR) 50 MG tablet Take 50 mg by mouth daily before breakfast.       . Melatonin ([employer] MELATONIN) 5 MG TABS Take 5 mg by mouth at bedtime.       . OxyCODONE (OXYCONTIN) 20 mg T12A 12 hr tablet Take 20 mg by mouth every 12 (twelve) hours.      [name]  pantoprazole (PROTONIX) 40 MG tablet Take 40 mg by mouth 2  (two) times daily.       Vladimir Faster Glycol-Propyl Glycol (SYSTANE ULTRA) 0.4-0.3 % SOLN Apply 2 drops to eye 2 (two) times daily as needed (dry eyes).       . potassium chloride SA (K-DUR,KLOR-CON) 20 MEQ tablet Take 20 mEq by mouth daily.      . predniSONE (DELTASONE) 5 MG tablet Take 5 mg by mouth daily with breakfast.       . simvastatin (ZOCOR) 40 MG tablet Take 40 mg by mouth daily. At lunch      . traZODone (DESYREL) 100 MG tablet Take 100 mg by mouth at bedtime. Take just before bedtime      . traZODone (DESYREL) 50 MG tablet Take 50 mg by mouth at bedtime. Take when wakes up over in the night      . Elastic Bandages & Supports (ABDOMINAL BINDER/ELASTIC MED) MISC 1 Device by Does not apply route as needed.  1 each  0  . losartan (COZAAR) 50 MG tablet TAKE 1 TABLET BY MOUTH ONCE A DAY  90 tablet  3    No results found for this or any previous visit (from the past 48 hour(s)). No results found.  Review of Systems  Constitutional: Negative.   Cardiovascular:       Stent  Genitourinary: Negative.   Musculoskeletal: Positive for back pain and neck pain.  Skin: Negative.   Neurological: Positive for sensory change and focal weakness.  Endo/Heme/Allergies: Negative.   Psychiatric/Behavioral: Negative.     Blood pressure 113/68, pulse 78, resp. rate 16, SpO2 97.00%. Physical Exam hent, nl. Neck, some discomfort wwith mobility. Cv, nl. Lungs clear. Abdomen, soft. Extremities, scars from several surgeries. NEURO SLR POSITIVE AS AS WELL FEMORAL STRETCH.  Weakness proximally. Mri shos severe ddd from l1 to s1  Assessment/Plan decompressionand fusion from l1 to S1. He and his wife are aware of risks and benefits  Graylee Arutyunyan M 03/02/2013, 7:50 AM

## 2013-03-02 NOTE — Anesthesia Postprocedure Evaluation (Signed)
  Anesthesia Post-op Note  Patient: [name]  Procedure(s) Performed: Procedure(s) with comments: Lumbar One to Sacral One Posterior lumbar interbody fusion (N/A) - L1 to S1 Posterior lumbar interbody fusion  Patient Location: PACU  Anesthesia Type:General  Level of Consciousness: awake  Airway and Oxygen Therapy: Patient Spontanous Breathing  Post-op Pain: mild  Post-op Assessment: Post-op Vital signs reviewed, Patient's Cardiovascular Status Stable, Respiratory Function Stable, Patent Airway, No signs of Nausea or vomiting and Pain level controlled  Post-op Vital Signs: Reviewed and stable  Complications: No apparent anesthesia complications

## 2013-03-02 NOTE — Progress Notes (Signed)
Dr. Joya Salm aware of pts low BP and in and out of bigeminy, Orders for labs

## 2013-03-02 NOTE — Progress Notes (Signed)
Notified Dr. Joya Salm of lab results per request. Patients blood pressure continues to be low; Systolic BP 85 with a MAP of 61. No new orders received regarding blood pressure. Verbal order for CBC tomorrow morning at 0800. Will continue to monitor.

## 2013-03-02 NOTE — Addendum Note (Signed)
Addendum created 03/02/13 1557 by Carney Living, CRNA   Modules edited: Anesthesia Blocks and Procedures

## 2013-03-02 NOTE — Addendum Note (Signed)
Addendum created 03/02/13 1620 by Carney Living, CRNA   Modules edited: Anesthesia Medication Administration

## 2013-03-02 NOTE — Progress Notes (Signed)
ANTIBIOTIC CONSULT NOTE - INITIAL  Pharmacy Consult for vancomycin Indication:  S/p lumbar fusion - post op propylaxis  Allergies  Allergen Reactions  . Morphine And Related Shortness Of Breath, Nausea And Vomiting and Swelling  . Penicillins Hives, Shortness Of Breath and Rash    REACTION: hives, breathing problems  . Percocet [Oxycodone-Acetaminophen] Anxiety    Patient Measurements: Height: 5' 6"  (167.6 cm) Weight: 158 lb 4.6 oz (71.8 kg) IBW/kg (Calculated) : 63.8   Vital Signs: Temp: 97 F (36.1 C) (11/25 1700) BP: 78/56 mmHg (11/25 1745) Pulse Rate: 104 (11/25 1745) Intake/Output from previous day:   Intake/Output from this shift: Total I/O In: 5700 [I.V.:4400; Blood:800; IV Piggyback:500] Out: 3050 [Urine:950; Drains:300; Blood:1800]  Labs:  Recent Labs  03/02/13 1000 03/02/13 1201 03/02/13 1656  WBC  --   --  11.8*  HGB 11.2* 10.9* 10.5*  PLT  --   --  149*  CREATININE  --   --  0.64   Estimated Creatinine Clearance: 83.1 ml/min (by C-G formula based on Cr of 0.64). No results found for this basename: VANCOTROUGH, Corlis Leak, VANCORANDOM, GENTTROUGH, GENTPEAK, GENTRANDOM, TOBRATROUGH, TOBRAPEAK, TOBRARND, AMIKACINPEAK, AMIKACINTROU, AMIKACIN,  in the last 72 hours   Microbiology: Recent Results (from the past 720 hour(s))  SURGICAL PCR SCREEN     Status: None   Collection Time    02/23/13 11:22 AM      Result Value Range Status   MRSA, PCR NEGATIVE  NEGATIVE Final   Staphylococcus aureus NEGATIVE  NEGATIVE Final   Comment:            The Xpert SA Assay (FDA     approved for NASAL specimens     in patients over 70 years of age),     is one component of     a comprehensive surveillance     program.  Test performance has     been validated by [employer] for patients greater     than or equal to 60 year old.     It is not intended     to diagnose infection nor to     guide or monitor treatment.    Medical History: Past Medical History   Diagnosis Date  . Hx of colonic polyps   . Diverticulosis   . Hypertension   . Osteoarthritis   . 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 CAD (coronary artery disease)     minimal coronary plaque in the LAD and right coronary system. 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 7-06. Last nuclear stress 11-17-06 fixed anterior/ inferior defect, no inducible ischemia, EF 81%  . Allergic rhinitis   . Hx of colonoscopy   . RA (rheumatoid arthritis)   . COPD (chronic obstructive pulmonary disease)   . PONV (postoperative nausea and vomiting)   . Dysrhythmia 01-24-12    past hx. A.Fib x1 episode-responded to med.  . Depression   . Anxiety     pt. feels its related to the medicine that he takes   . GERD (gastroesophageal reflux disease)     Assessment: 67yom with longstanding lumbar pain.  S/p lumbar fusion today.  Plan vancomycin for post op prophylaxis.  Wt 70kg, Cr 0.78 with CrCl approx 59m/min.    Goal of Therapy:  Vancomycin trough 10-15  Plan:  Vancomycin 1Gm IV q12 - 1st  dose at [place].D. CPP, BCPS Clinical Pharmacist [phone] 03/02/2013 6:08 PM

## 2013-03-02 NOTE — Progress Notes (Signed)
pts blood pressure down, Dr. Chriss Driver aware and orders noted

## 2013-03-02 NOTE — Progress Notes (Signed)
[phone] 

## 2013-03-02 NOTE — Anesthesia Preprocedure Evaluation (Addendum)
Anesthesia Evaluation  Patient identified by MRN, date of birth, ID band Patient awake    Reviewed: Allergy & Precautions, H&P , NPO status , Patient's Chart, lab work & pertinent test results  History of Anesthesia Complications (+) PONV  Airway Mallampati: I TM Distance: >3 FB Neck ROM: Full    Dental  (+) Teeth Intact   Pulmonary COPD Chronic cough Sinusitis hx breath sounds clear to auscultation        Cardiovascular hypertension, Pt. on medications + CAD + dysrhythmias Atrial Fibrillation Rhythm:Regular Rate:Normal  PVCs   Neuro/Psych Anxiety  Neuromuscular disease    GI/Hepatic GERD-  Medicated and Controlled,  Endo/Other    Renal/GU      Musculoskeletal  (+) Arthritis -, Rheumatoid disorders,    Abdominal   Peds  Hematology  (+) anemia ,   Anesthesia Other Findings   Reproductive/Obstetrics                          Anesthesia Physical Anesthesia Plan  ASA: III  Anesthesia Plan: General   Post-op Pain Management:    Induction: Intravenous  Airway Management Planned: Oral ETT  Additional Equipment:   Intra-op Plan:   Post-operative Plan: Extubation in OR  Informed Consent: I have reviewed the patients History and Physical, chart, labs and discussed the procedure including the risks, benefits and alternatives for the proposed anesthesia with the patient or authorized representative who has indicated his/her understanding and acceptance.   Dental advisory given  Plan Discussed with: CRNA, Surgeon and Anesthesiologist  Anesthesia Plan Comments:        Anesthesia Quick Evaluation

## 2013-03-02 NOTE — Transfer of Care (Signed)
Immediate Anesthesia Transfer of Care Note  Patient: [name]  Procedure(s) Performed: Procedure(s) with comments: Lumbar One to Sacral One Posterior lumbar interbody fusion (N/A) - L1 to S1 Posterior lumbar interbody fusion  Patient Location: PACU  Anesthesia Type:General  Level of Consciousness: awake, alert , oriented and patient cooperative  Airway & Oxygen Therapy: Patient Spontanous Breathing and Patient connected to nasal cannula oxygen  Post-op Assessment: Report given to PACU RN, Post -op Vital signs reviewed and stable and Patient moving all extremities X 4  Post vital signs: Reviewed and stable  Complications: No apparent anesthesia complications

## 2013-03-02 NOTE — Anesthesia Procedure Notes (Signed)
Procedure Name: Intubation Date/Time: 03/02/2013 8:09 AM Performed by: Carney Living Pre-anesthesia Checklist: Patient identified, Emergency Drugs available, Suction available, Patient being monitored and Timeout performed Patient Re-evaluated:Patient Re-evaluated prior to inductionOxygen Delivery Method: Circle system utilized Preoxygenation: Pre-oxygenation with 100% oxygen Intubation Type: IV induction Ventilation: Mask ventilation without difficulty Laryngoscope Size: Mac and 4 Grade View: Grade I Tube type: Oral Tube size: 7.5 mm Number of attempts: 1 Airway Equipment and Method: Stylet Placement Confirmation: ETT inserted through vocal cords under direct vision,  positive ETCO2 and breath sounds checked- equal and bilateral Secured at: 21 cm Tube secured with: Tape Dental Injury: Teeth and Oropharynx as per pre-operative assessment

## 2013-03-02 NOTE — Preoperative (Signed)
Beta Blockers   Reason not to administer Beta Blockers:Not Applicable 

## 2013-03-03 ENCOUNTER — Encounter (HOSPITAL_COMMUNITY): Payer: Self-pay | Admitting: Neurosurgery

## 2013-03-03 DIAGNOSIS — J441 Chronic obstructive pulmonary disease with (acute) exacerbation: Secondary | ICD-10-CM

## 2013-03-03 DIAGNOSIS — R579 Shock, unspecified: Secondary | ICD-10-CM

## 2013-03-03 LAB — BASIC METABOLIC PANEL
BUN: 5 mg/dL — ABNORMAL LOW (ref 6–23)
CO2: 26 mEq/L (ref 19–32)
Calcium: 7.6 mg/dL — ABNORMAL LOW (ref 8.4–10.5)
Chloride: 104 mEq/L (ref 96–112)
Creatinine, Ser: 0.55 mg/dL (ref 0.50–1.35)
GFR calc Af Amer: >90 mL/min (ref >90)
GFR calc non Af Amer: >90 mL/min (ref >90)
Glucose, Bld: 170 mg/dL — ABNORMAL HIGH (ref 70–99)
Potassium: 3.5 mEq/L (ref 3.5–5.1)
Sodium: 138 mEq/L (ref 135–145)

## 2013-03-03 LAB — CBC
HCT: 22.5 % — ABNORMAL LOW (ref 39.0–52.0)
HCT: 25.3 % — ABNORMAL LOW (ref 39.0–52.0)
Hemoglobin: 7.8 g/dL — ABNORMAL LOW (ref 13.0–17.0)
Hemoglobin: 8.8 g/dL — ABNORMAL LOW (ref 13.0–17.0)
MCH: 31.2 pg (ref 26.0–34.0)
MCH: 31.7 pg (ref 26.0–34.0)
MCHC: 34.7 g/dL (ref 30.0–36.0)
MCHC: 34.8 g/dL (ref 30.0–36.0)
MCV: 90 fL (ref 78.0–100.0)
MCV: 91 fL (ref 78.0–100.0)
Platelets: 160 10*3/uL (ref 150–400)
Platelets: 164 10*3/uL (ref 150–400)
RBC: 2.5 MIL/uL — ABNORMAL LOW (ref 4.22–5.81)
RBC: 2.78 MIL/uL — ABNORMAL LOW (ref 4.22–5.81)
RDW: 14.3 % (ref 11.5–15.5)
RDW: 14.3 % (ref 11.5–15.5)
WBC: 9.7 10*3/uL (ref 4.0–10.5)
WBC: 9.4 10*3/uL (ref 4.0–10.5)

## 2013-03-03 MED ORDER — SODIUM CHLORIDE 0.9 % IV BOLUS (SEPSIS): 500.0000 mL | Freq: Once | INTRAVENOUS | Status: DC

## 2013-03-03 MED ORDER — SODIUM CHLORIDE 0.9 % IV BOLUS (SEPSIS)
500.0000 mL | Freq: Once | INTRAVENOUS | Status: AC
  Administered 2013-03-03: 500 mL via INTRAVENOUS

## 2013-03-03 MED ORDER — TIOTROPIUM BROMIDE MONOHYDRATE 18 MCG IN CAPS
18.0000 ug | ORAL_CAPSULE | Freq: Every day | RESPIRATORY_TRACT | Status: DC
  Administered 2013-03-03 – 2013-03-09 (×7): 18 ug via RESPIRATORY_TRACT
  Filled 2013-03-03: qty 5

## 2013-03-03 MED ORDER — PHENYLEPHRINE HCL 10 MG/ML IJ SOLN
30.0000 ug/min | INTRAVENOUS | Status: DC
  Administered 2013-03-03: 30 ug/min via INTRAVENOUS
  Filled 2013-03-03: qty 4

## 2013-03-03 MED ORDER — HYDROCORTISONE SOD SUCCINATE 100 MG IJ SOLR
50.0000 mg | Freq: Four times a day (QID) | INTRAMUSCULAR | Status: DC
  Administered 2013-03-03 – 2013-03-04 (×5): 50 mg via INTRAVENOUS
  Filled 2013-03-03 (×8): qty 1

## 2013-03-03 MED ORDER — SODIUM CHLORIDE 0.9 % IV BOLUS (SEPSIS)
500.0000 mL | Freq: Once | INTRAVENOUS | Status: AC
Start: 2013-03-03 — End: 2013-03-03
  Administered 2013-03-03: 500 mL via INTRAVENOUS

## 2013-03-03 MED ORDER — SODIUM CHLORIDE 0.9 % IV BOLUS (SEPSIS)
1000.0000 mL | Freq: Once | INTRAVENOUS | Status: AC
  Administered 2013-03-03: 1000 mL via INTRAVENOUS

## 2013-03-03 MED ORDER — SIMETHICONE 80 MG PO CHEW
80.0000 mg | CHEWABLE_TABLET | Freq: Four times a day (QID) | ORAL | Status: DC | PRN
Start: 2013-03-03 — End: 2013-03-10
  Administered 2013-03-03: 80 mg via ORAL
  Filled 2013-03-03 (×2): qty 1

## 2013-03-03 MED ORDER — ALBUTEROL SULFATE (5 MG/ML) 0.5% IN NEBU: 2.5000 mg | INHALATION_SOLUTION | RESPIRATORY_TRACT | Status: DC | PRN

## 2013-03-03 MED FILL — Sodium Chloride IV Soln 0.9%: INTRAVENOUS | Qty: 1000 | Status: AC

## 2013-03-03 MED FILL — Heparin Sodium (Porcine) Inj 1000 Unit/ML: INTRAMUSCULAR | Qty: 30 | Status: AC

## 2013-03-03 MED FILL — Sodium Chloride Irrigation Soln 0.9%: Qty: 3000 | Status: AC

## 2013-03-03 NOTE — Evaluation (Signed)
Physical Therapy Evaluation Patient Details Name: [name] MRN: [id number] DOB: [birth year] Today's Date: 03/03/2013 Time: 1200-1230 PT Time Calculation (min): 30 min  PT Assessment / Plan / Recommendation History of Present Illness  Patient is a 67 yo male with chronic back pain and underwent decompression fusion L1 to S1 11/25. He developed persistent hypotension 11/26 and proved refractory to IVF and required neo drip. We note he is on chronic steroids. PCCM asked to assist in his in care. PMH includes CAD, COPD, RA, chronic pain.  Clinical Impression  Patient reported being independent in all activities prior to admission but now requires assist and RW for safe mobility. Pt unsteady and is at increased falls risk. Pt however is motivated and demos excellent rehab potential. Pt an excellent candidate for CIR to achieve safe mod I function for safe transition home.     PT Assessment  Patient needs continued PT services    Follow Up Recommendations  CIR     Does the patient have the potential to tolerate intense rehabilitation      Barriers to Discharge   pt spouse works during the day    Equipment Recommendations  None recommended by PT    Recommendations for Other Services     Frequency Min 5X/week    Precautions / Restrictions Precautions Precautions: Back Precaution Booklet Issued: Yes (comment) Precaution Comments: sx back precautions Restrictions Weight Bearing Restrictions: No   Pertinent Vitals/Pain Pt reports pain of 5/10 at the start of session today that is an improvement over last 24hrs. End of session reports 6/10 feeling of nausea due to pain. BP 120/83, HR 96 with no significant rise due to activity.      Mobility  Bed Mobility Bed Mobility: Not assessed (patient seated in chair on arrival) Transfers Transfers: Stand to Sit;Sit to Stand Sit to Stand: 4: Min guard;From chair/3-in-1;With upper extremity assist Stand to Sit: To chair/3-in-1;With  upper extremity assist;4: Min guard Details for Transfer Assistance: cues for ant translation and to stand inside RW Ambulation/Gait Ambulation/Gait Assistance: 4: Min assist Ambulation Distance (Feet): 150 Feet Assistive device: Rolling walker Ambulation/Gait Assistance Details: max directional v/c's for safe walker management. pt extremely shaky/unsteady Gait Pattern: Step-through pattern;Wide base of support Gait velocity: v/c's to decrease pace for safey General Gait Details: uses momentum rather than controlled mvmts;  Stairs: No    Exercises     PT Diagnosis: Difficulty walking;Generalized weakness;Acute pain  PT Problem List: Decreased strength;Decreased activity tolerance;Decreased mobility PT Treatment Interventions: Gait training;Stair training;Functional mobility training;Therapeutic activities;Therapeutic exercise     PT Goals(Current goals can be found in the care plan section) Acute Rehab PT Goals Patient Stated Goal: To become more independent and able to stand for as long as he wants without pain;  Visit Information  Last PT Received On: 03/03/13 Assistance Needed: +1 History of Present Illness: Patient is a 67 yo male with chronic back pain and underwent decompression fusion L1 to S1 11/25. He developed persistent hypotension 11/26 and proved refractory to IVF and required neo drip. We note he is on chronic steroids. PCCM asked to assist in his in care. PMH includes CAD, COPD, RA, chronic pain.       Prior [place] expects to be discharged to:: Other (Comment) Living Arrangements: Spouse/significant other Available Help at Discharge: Family;Available PRN/intermittently Type of Home: House Home Access: Stairs to enter [employer] of Steps: 5 Entrance Stairs-Rails: Right Home Layout: Two level;Able to live on main level with  bedroom/bathroom Home Equipment: Walker - 2 wheels;Kasandra Knudsen - single point  Lives With: Significant  other Prior Function Level of Independence: Independent Comments: reports decrease function and mobility due to pain Communication Communication: No difficulties Dominant Hand: Right    Cognition  Cognition Arousal/Alertness: Awake/alert Behavior During Therapy: WFL for tasks assessed/performed Overall Cognitive Status: Within Functional Limits for tasks assessed    Extremity/Trunk Assessment Upper Extremity Assessment Upper Extremity Assessment: Overall WFL for tasks assessed Lower Extremity Assessment Lower Extremity Assessment: Generalized weakness Cervical / Trunk Assessment Cervical / Trunk Assessment: Normal (s/p lumbar spine sx)   Balance    End of Session PT - End of Session Equipment Utilized During Treatment: Gait belt Activity Tolerance: Patient limited by pain Patient left: in chair;with call bell/phone within reach Nurse Communication: Mobility status  GP     Kingsley Callander, SPT 03/03/2013, 3:16 PM  Agree with above assessment.  Kittie Plater, PT, DPT Pager #: [phone] Office #: [phone]

## 2013-03-03 NOTE — Evaluation (Signed)
Physical Therapy Evaluation Patient Details Name: [name] MRN: [id number] DOB: 1945-09-01 Today's Date: 03/03/2013 Time: 1200-1230 PT Time Calculation (min): 30 min  PT Assessment / Plan / Recommendation History of Present Illness  Patient is a 67 yo male with chronic back pain and underwent decompression fusion L1 to S1 11/25. He developed persistent hypotension 11/26 and proved refractory to IVF and required neo drip. We note he is on chronic steroids. PCCM asked to assist in his in care. PMH includes CAD, COPD, RA, chronic pain.  Clinical Impression  Patient reported being independent in all activities prior to admission but ambulated today using a RW. Presented with unsteadiness in gait with noted shaking. He can benefit from further PT services in order to address these and deficits listed below and to increase his independence. He is interested in short-term rehab  upon d/c from the hospital due to being home alone during the day without the assistance of his spouse.     PT Assessment  Patient needs continued PT services    Follow Up Recommendations  CIR (short-term inpatient rehab)    Does the patient have the potential to tolerate intense rehabilitation      Barriers to Discharge        Equipment Recommendations  None recommended by PT    Recommendations for Other Services     Frequency Min 5X/week    Precautions / Restrictions Precautions Precautions: Back Precaution Booklet Issued: Yes (comment) Precaution Comments: sx back precautions Restrictions Weight Bearing Restrictions: No   Pertinent Vitals/Pain Pt reports pain of 5/10 at the start of session today that is an improvement over last 24hrs. End of session reports 6/10 feeling of nausea due to pain. BP 120/83, HR 96 with no significant rise due to activity.      Mobility  Bed Mobility Bed Mobility: Not assessed (patient seated in chair on arrival) Transfers Transfers: Stand to Sit;Sit to  Stand Sit to Stand: 4: Min guard;From chair/3-in-1;With upper extremity assist Stand to Sit: To chair/3-in-1;With upper extremity assist;4: Min guard Details for Transfer Assistance: cues for ant translation and to stand inside RW Ambulation/Gait Ambulation/Gait Assistance: 4: Min guard Ambulation Distance (Feet): 150 Feet Assistive device: Rolling walker Ambulation/Gait Assistance Details: cues to step inside the RW; cues to slow down for safety Gait Pattern: Step-through pattern;Wide base of support Gait velocity: walks quickly General Gait Details: uses momentum rather than controlled mvmts;  Stairs: No    Exercises     PT Diagnosis: Difficulty walking;Generalized weakness;Acute pain  PT Problem List: Decreased strength;Decreased activity tolerance;Decreased mobility PT Treatment Interventions: Gait training;Stair training;Functional mobility training;Therapeutic activities;Therapeutic exercise     PT Goals(Current goals can be found in the care plan section) Acute Rehab PT Goals Patient Stated Goal: To become more independent and able to stand for as long as he wants without pain;  Visit Information  Last PT Received On: 03/03/13 Assistance Needed: +1 History of Present Illness: Patient is a 67 yo male with chronic back pain and underwent decompression fusion L1 to S1 11/25. He developed persistent hypotension 11/26 and proved refractory to IVF and required neo drip. We note he is on chronic steroids. PCCM asked to assist in his in care. PMH includes CAD, COPD, RA, chronic pain.       Prior [place] expects to be discharged to:: Other (Comment) Living Arrangements: Spouse/significant other Available Help at Discharge: Family;Available PRN/intermittently Type of Home: House Home Access: Stairs to enter [employer] of  Steps: 5 Entrance Stairs-Rails: Right Home Layout: Two level;Able to live on main level with bedroom/bathroom Home  Equipment: Gilford Rile - 2 wheels;Kasandra Knudsen - single point  Lives With: Significant other Prior Function Level of Independence: Independent Comments: reports decrease function and mobility due to pain Communication Communication: No difficulties Dominant Hand: Right    Cognition  Cognition Arousal/Alertness: Awake/alert Behavior During Therapy: WFL for tasks assessed/performed Overall Cognitive Status: Within Functional Limits for tasks assessed    Extremity/Trunk Assessment Upper Extremity Assessment Upper Extremity Assessment: Overall WFL for tasks assessed Lower Extremity Assessment Lower Extremity Assessment: Generalized weakness Cervical / Trunk Assessment Cervical / Trunk Assessment: Normal (s/p lumbar spine sx)   Balance    End of Session PT - End of Session Equipment Utilized During Treatment: Gait belt Activity Tolerance: Patient limited by pain Patient left: in chair;with call bell/phone within reach Nurse Communication: Mobility status  GP     Anastasia Fiedler, SPT 03/03/2013, 2:34 PM

## 2013-03-03 NOTE — Op Note (Signed)
NAMEWESLIE, PRETLOW            ACCOUNT NO.:  [id number]  MEDICAL RECORD NO.:  [id number]  LOCATION:  3M09C                        FACILITY:  [facility]  PHYSICIAN:  Leeroy Cha, M.D.   DATE OF BIRTH:  [birth year]  DATE OF PROCEDURE:  03/02/2013 DATE OF DISCHARGE:                              OPERATIVE REPORT   PREOPERATIVE DIAGNOSIS:  Degenerative disk disease, lumbar with a chronic radiculopathy.  Right L2-3 herniated disk with fragment.  Large calcified disk at L5-1 to the left.  Status post fusion at L4-5.  POSTOPERATIVE DIAGNOSIS:  Degenerative disk disease, lumbar with a chronic radiculopathy. Right L2-3 herniated disk with fragment.  Large calcified disk at L5-1 to the left.  Status post fusion at L4-5.  PROCEDURE:  Bilateral L2, L3, and L5 laminectomy, removal of overgrowth of L4 lamina.  Bilaterally L2, L3, and L5 facetectomy.  Lysis of adhesions.  Decompression of the thecal sac.  Removal of right L2-L3 large fragment, 3 fragments.  The right L2-3 discectomy.  Decompression of the thecal sac at the level of L5-S1 left with removal of a large calcified herniated disk compromising the thecal sac.  Pedicle screws L1, L2, L3, and S1.  Fusion from L1 to S1 involving the L4-5 fusion. Foraminotomy to decompress the L1-S1 nerve root bilaterally. Posterolateral arthrodesis with autograft and BMP.  Cell Saver, C-arm.  SURGEON:  Leeroy Cha, M.D.  ASSISTANT:  Dr. Vertell Limber.  CLINICAL HISTORY:  Mr. Summons is a gentleman, who had been seen by me in my office for many years.  The patient in the past has a fusion at L4- 5.  He had been doing fairly well, but lately he is getting worse.  He had developed more degenerative disk disease with scoliosis and lately we found that he has a herniated disk, acute at the level of L1-2 to the right, and a large calcified disk with placement of the thecal sac at L5- 1 to the left.  Surgery was advised.  He and his wife knew the risk with the  surgery including the possibility of no improvement whatsoever, infection, CSF leak, and more surgery.  PROCEDURE IN DETAIL:  The patient was intubated.  After that, he was positioned in a prone manner.  Then, the wound was cleaned first with Betadine and later on with DuraPrep.  Then, drapes were applied. Midline incision from T12 all the way down to L5-S1 was made and muscle were retracted all the way laterally and we were able to see the transverse process of from L1-L5 and S1.  The patient did not stop taking the aspirin and Celebrex, and procedure during the whole 5 hours was quite bloody.  With x-ray, we identified the area, we cleaned the area between L4-5 and the rods were removed bilaterally as well as the Capps.  From then on, we removed the lamina of spinous process of L2, L3, L5, and we re-did the lamina of L4, which was narrow since the previous surgery.  The patient had quite a bit adhesion and probably 30% of the procedure was doing lysis of adhesion.  At the level of L1-L2 to the right, we found that the patient has a very large fragment compromising the  lateral aspect of the thecal sac.  After the removal there was a hole in the disk and total discectomy from the right side was achieved.  The area was quite narrow.  Then, we investigated the area between L5-S1 to the left, we knew that he has large calcified disk.  Lysis was accomplished, and the procedure took [place] quite a bit time protecting the thecal sac as well as the L5 and S1 nerve root being able to remove the disk.  The disk space vessel was really tight.  We investigated the area between L3-4, L2-3 and there was _no space_________ to introduce any cages because the area was quite narrow, but nevertheless we achieved good foraminotomy and decompression of the L1-S1 nerve root was done bilaterally.  Then, using the C-arm in AP view and then a lateral view, we probed the pedicle of L1, L2, L3, and S1.  We were  able to introduce the screws length from 5.5 x 45 and the level of S1 was 5.5 x 55.  We were able to involve the previous screw in the new fusion with a large rod bilaterally.  Then, two cross-link were used in the area. With the Leksell as well as the drill, we removed the periosteum of L1- 2, L2-3, L3-4, L4-5, as well as L5-S1.  Then, a mix of autograft and BMP was used for arthrodesis.  At the end, we have a good decompression. With the hockey stick, I was able to feel the interspace of the fragment for the nerve root from L1-S1 with plenty of space.  The area was irrigated.  Valsalva maneuver was negative.  Then, surgical glue was used at the L5-S1.  A large Hemovac was left in the epidural space and the wound was closed with Vicryl and nylon.  A Hemovac was left for drainage.          ______________________________ Leeroy Cha, M.D.     EB/MEDQ  D:  03/02/2013  T:  03/03/2013  Job:  [id number]

## 2013-03-03 NOTE — Clinical Social Work Note (Signed)
Clinical Social Work Department BRIEF PSYCHOSOCIAL ASSESSMENT 03/03/2013  Patient:  [name], [name]     Account Number:  [id number]     Admit date:  03/02/2013  Clinical Social Worker:  Myles Lipps  Date/Time:  03/03/2013 04:37 PM  Referred by:  Physician  Date Referred:  03/03/2013 Referred for  SNF Placement   Other Referral:   Interview type:  Patient Other interview type:   No family currently at bedside    PSYCHOSOCIAL DATA Living Status:  WIFE Admitted from facility:   Level of care:   Primary support name:  Croft,Debbie  [phone] Primary support relationship to patient:  PARTNER Degree of support available:   Strong    CURRENT CONCERNS Current Concerns  Post-Acute Placement   Other Concerns:    SOCIAL WORK ASSESSMENT / PLAN Clinical Social Worker met with patient at bedside to offer support and discuss patient needs at discharge.  Patient states that he lives at home with his wife who works 1-2 days a week.  Patient is hopeful for placement at inpatient rehab, however is very understanding regarding the limitations of his insurance coverage.  Patient is agreeable to initiate SNF search in [facility], however plans to further discuss discharge options with his wife prior to making final decision.  CSW to follow up with patient and wife regarding potential bed offers once available.  CSW remains available for support and to facilitate patient discharge needs once medically ready.   Assessment/plan status:  Psychosocial Support/Ongoing Assessment of Needs Other assessment/ plan:   Information/referral to community resources:   Patient does not address need for resources at this time - CSW to provide facility list once bed offers available.    PATIENT'S/FAMILY'S RESPONSE TO PLAN OF CARE: Patient alert and oriented x3 laying in the bed.  Patient very willing to engage in assessment process.  Patient understanding of plans at discharge and plans to  further discuss.  Patient understands social work role and express appreciation for CSW support and concern.

## 2013-03-03 NOTE — Progress Notes (Signed)
Spoke with Dr. Jimmy Footman again regarding patients hypotension. BP still continues to run low, systolic in the 29'N and a MAP in the upper 50's to 60's despite receiving two 571m Ns boluses. Order given for a third 5018mbolus. Bolus is currently infusing. Will continue to monitor.

## 2013-03-03 NOTE — Progress Notes (Signed)
Spoke with Dr. Jimmy Footman again about hypotension. Patient still continues to be hypotensive despite three 554m boluses. Order received for a fourth 504mbolus, neo-synephrine gtt, and STAT CBC. Orders carried out. Will continue to monitor.

## 2013-03-03 NOTE — Progress Notes (Signed)
Occupational Therapy Evaluation Patient Details Name: [name] MRN: [id number] DOB: 1945/11/21 Today's Date: 03/03/2013 Time: 1700-1731 OT Time Calculation (min): 31 min  OT Assessment / Plan / Recommendation History of present illness Patient is a 67 yo male with chronic back pain and underwent decompression fusion L1 to S1 11/25. He developed persistent hypotension 11/26 and proved refractory to IVF and required neo drip. We note he is on chronic steroids. PCCM asked to assist in his in care. PMH includes CAD, COPD, RA, chronic pain.   Clinical Impression   PTA, pt independent with ADL and mobility. Pt very motivated to return to PLOF. Pt would benefit from short CIR stay to return to MOD I level in order to D/C home safely with intermittent S  of significant other. Pt will benefit from skilled OT services to facilitate D/C to CIR due to below deficits. Will educate on AE next session and further assess equipment needs.    OT Assessment  Patient needs continued OT Services    Follow Up Recommendations  CIR    Barriers to Discharge      Equipment Recommendations  3 in 1 bedside comode    Recommendations for Other Services Rehab consult  Frequency  Min 3X/week    Precautions / Restrictions Precautions Precautions: Back Precaution Booklet Issued: Yes (comment) Precaution Comments: sx back precautions Restrictions Weight Bearing Restrictions: No   Pertinent Vitals/Pain no apparent distress     ADL  Grooming: Set up;Supervision/safety Where Assessed - Grooming: Supported standing Upper Body Bathing: Supervision/safety;Set up Where Assessed - Upper Body Bathing: Supported sitting Lower Body Bathing: Moderate assistance Where Assessed - Lower Body Bathing: Supported sit to stand Upper Body Dressing: Set up;Supervision/safety Where Assessed - Upper Body Dressing: Supported sitting Lower Body Dressing: Moderate assistance Where Assessed - Lower Body Dressing:  Supported sit to Lobbyist: Minimal assistance Toilet Transfer Method: Sit to stand Equipment Used: Gait belt;Rolling walker Transfers/Ambulation Related to ADLs: Min A ADL Comments: Educated pt on compensatory techniques for ADL. May benefit from AE    OT Diagnosis: Generalized weakness;Acute pain  OT Problem List: Decreased strength;Decreased activity tolerance;Decreased knowledge of use of DME or AE;Decreased knowledge of precautions;Pain OT Treatment Interventions: Self-care/ADL training;DME and/or AE instruction;Therapeutic activities;Patient/family education   OT Goals(Current goals can be found in the care plan section) Acute Rehab OT Goals Patient Stated Goal: to be independent OT Goal Formulation: With patient Time For Goal Achievement: 03/17/13 Potential to Achieve Goals: Good  Visit Information  Last OT Received On: 03/03/13 Assistance Needed: +1 History of Present Illness: Patient is a 67 yo male with chronic back pain and underwent decompression fusion L1 to S1 11/25. He developed persistent hypotension 11/26 and proved refractory to IVF and required neo drip. We note he is on chronic steroids. PCCM asked to assist in his in care. PMH includes CAD, COPD, RA, chronic pain.       Prior [place] expects to be discharged to:: Other (Comment) Living Arrangements: Spouse/significant other Available Help at Discharge: Family;Available PRN/intermittently Type of Home: House Home Access: Stairs to enter [employer] of Steps: 5 Entrance Stairs-Rails: Right Home Layout: Two level;Able to live on main level with bedroom/bathroom Home Equipment: Gilford Rile - 2 wheels;Kasandra Knudsen - single point  Lives With: Significant other Prior Function Level of Independence: Independent Comments: reports decrease function and mobility due to pain Communication Communication: No difficulties Dominant Hand: Right  Vision/Perception     Cognition  Cognition Arousal/Alertness: Awake/alert Behavior During Therapy: WFL for tasks assessed/performed Overall Cognitive Status: Within Functional Limits for tasks assessed    Extremity/Trunk Assessment Upper Extremity Assessment Upper Extremity Assessment: LUE deficits/detail (shoulder surgery) Cervical / Trunk Assessment Cervical / Trunk Assessment: Normal (s/p lumbar spine sx)     Mobility Bed Mobility Bed Mobility: Not assessed (patient seated in chair on arrival) Transfers Transfers: Sit to Stand;Stand to Sit Sit to Stand: With upper extremity assist;4: Min assist;From bed Stand to Sit: To chair/3-in-1;4: Min assist;With upper extremity assist Details for Transfer Assistance: vc for back precautions     Exercise Other Exercises Other Exercises: Educated on importance on changing positions Other Exercises: Educated on proper positioning in sidelying Other Exercises: educatedon use of ice   Balance Balance Balance Assessed:  (Min A)   End of Session OT - End of Session Equipment Utilized During Treatment: Gait belt;Rolling walker Activity Tolerance: Patient tolerated treatment well Patient left: in chair;with call bell/phone within reach;with family/visitor present Nurse Communication: Mobility status;Patient requests pain meds (request to change to Robaxin)  GO     [name],HILLARY 03/03/2013, 6:02 PM [facility], OTR/L  [phone] 03/03/2013

## 2013-03-03 NOTE — Progress Notes (Signed)
Patient ID: [name], male   DOB: 31-Aug-1945, 67 y.o.   MRN: [id number] Doing well. No weakness. Decrease of incisional pain.HB DOWN TO 7.8. REHABILITATION TO SEE.

## 2013-03-03 NOTE — Progress Notes (Signed)
eLink Physician-Brief Progress Note Patient Name: [name] DOB: July 31, 1945 MRN: [id number]  Date of Service  03/03/2013   HPI/Events of Note  Patient s/p lumbar fusion with epidual PCA.  Has hypotension which responded to a 500 cc bolus of NS earlier.  Nurse contacted on call NS and Dr. Vertell Limber is requesting PCCM input related to medical management.  BP is 82/50.  Patient alert/oriented and making urine.   eICU Interventions  Plan: Suspect hypotension related to PCA Fluid bolus of 500 cc NS for hypotension Consider aline/neo gtt if persistent   Intervention Category Intermediate Interventions: Hypotension - evaluation and management  DETERDING,ELIZABETH 03/03/2013, 2:04 AM

## 2013-03-03 NOTE — Progress Notes (Signed)
Paged MD on call / Dr. Vertell Limber at [phone] 11/26 about patient being hypotensive. Dr. Vertell Limber responded back at 0145 and gave order to give one 569m Ns bolus. Dr SVertell Limberalso requested CCM to get involved. Called E-Link at 0155 and spoke with Dr. DJimmy Footmanabout patient's BP and Dr. SDonald Porerequest. Dr. DJimmy Footmangave order for an additional 5093mNs bolus. One 50067molus given thus far. Will continue to monitor.

## 2013-03-03 NOTE — Consult Note (Signed)
PULMONARY  / CRITICAL CARE MEDICINE  Name: DARLENE BARTELT MRN: [id number] DOB: [birth year]    ADMISSION DATE:  03/02/2013 CONSULTATION DATE:  11-26  REFERRING MD :  neuro PRIMARY SERVICE: Neuro  CHIEF COMPLAINT:  Shock  BRIEF PATIENT DESCRIPTION:  67 yo wm with chronic back pain and underwent decompression fusion L1 to S1 11-25. He developed persistent hypotension 11-26 and proved refractory to IVF and required neo drip. We note he is on chronic steroids. PCCM asked to assist in his in care. PMH - CAD, COPD, RA, chronic pain  SIGNIFICANT EVENTS / STUDIES:  11-26 hypotension  LINES / TUBES:   CULTURES:   ANTIBIOTICS: 11-25 vanco>>  HISTORY OF PRESENT ILLNESS:    67 yo wm with chronic back pain and underwent decompression fusion L1 to S1 11-25. He developed persistent hypotension 11-26 and proved refractory to IVF and required neo drip. We note he is on chronic steroids. PCCM asked to assist in his in care. PAST MEDICAL HISTORY :  Past Medical History  Diagnosis Date  . Hx of colonic polyps   . Diverticulosis   . Hypertension   . Osteoarthritis   . 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 CAD (coronary artery disease)     minimal coronary plaque in the LAD and right coronary system. 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 7-06. Last nuclear stress 11-17-06 fixed anterior/ inferior defect, no inducible ischemia, EF 81%  . Allergic rhinitis   . Hx of colonoscopy   . RA (rheumatoid arthritis)   . COPD (chronic obstructive pulmonary disease)   . PONV (postoperative nausea and vomiting)   . Dysrhythmia 01-24-12    past hx. A.Fib x1 episode-responded to med.  . Depression   . Anxiety     pt. feels its related to the medicine that he takes   . GERD (gastroesophageal reflux disease)    Past Surgical History  Procedure Laterality Date  . Appendectomy    .  Inguinal hernia repair  2008    Right, left remotely  . Total knee arthroplasty      right knee partial 2002  . Tonsillectomy    . Shoulder preplacement  2008    left, partial  . Ptca w/ coated stent lad      Cx secondary to tear  . Foot surgery  11-08    for removal of bone spurs- right foot  . Reconstruction of ankle  6-09    Right- [facility]  . L4-5 diskectomy w/ fusion, cage placement and rods  12-10    [place]  . Partial knee arthroplasty      left  . Total shoulder replacement      right  . Joint replacement    . Cataract extraction, bilateral  01-24-12    Bilateral  . Orif shoulder fracture  02/06/2012    Procedure: OPEN REDUCTION INTERNAL FIXATION (ORIF) SHOULDER FRACTURE;  Surgeon: Nita Sells, MD;  Location: [facility];  Service: Orthopedics;  Laterality: Left;  ORIF of a Left Shoulder Fracture with  Iliac Crest Bone Graft aspiration   . Harvest bone graft  02/06/2012    Procedure: HARVEST ILIAC BONE GRAFT;  Surgeon: Nita Sells, MD;  Location: [facility];  Service: Orthopedics;;  bone marrow aspirqation   . Hardware removal  03/09/2012    Procedure: HARDWARE REMOVAL;  Surgeon: Nita Sells, MD;  Location: [place];  Service: Orthopedics;  Laterality: Left;  Hardware Removal from Left Shoulder  . Cardiac catheterization  2006    coronary stents- no problems  . Shoulder arthroscopy  2013  . Carpal tunnel release      R hand  . Ankle surgery Right 2008    at [facility], replacement   . Dg toes*r*      toe surgery- 07/2012  . Posterior lumbar fusion 4 level N/A 03/02/2013    Procedure: Lumbar One to Sacral One Posterior lumbar interbody fusion;  Surgeon: Floyce Stakes, MD;  Location: [place] [facility];  Service: Neurosurgery;  Laterality: N/A;  L1 to S1 Posterior lumbar interbody fusion   Prior to Admission medications   Medication Sig Start Date End Date Taking? Authorizing Provider  Aclidinium Bromide (TUDORZA PRESSAIR) 400 MCG/ACT AEPB  Inhale 1 puff into the lungs 2 (two) times daily. 01/23/12  Yes Neena Rhymes, MD  adalimumab (HUMIRA) 40 MG/0.8ML injection Inject 40 mg into the skin every 14 (fourteen) days.   Yes Historical Provider, MD  ALPRAZolam Duanne Moron) 0.5 MG tablet Take 0.5 mg by mouth 3 (three) times daily as needed for anxiety or sleep.   Yes Historical Provider, MD  aspirin (ASPIRIN EC) 81 MG EC tablet Take 81 mg by mouth every morning.    Yes Historical Provider, MD  celecoxib (CELEBREX) 200 MG capsule Take 200 mg by mouth 2 (two) times daily.   Yes Historical Provider, MD  cholecalciferol (VITAMIN D) 1000 UNITS tablet Take 1 tablet (1,000 Units total) by mouth daily. 06/24/12 06/24/13 Yes Aleksei V Plotnikov, MD  COLCRYS 0.6 MG tablet TAKE 1 TABLET (0.6 MG TOTAL) BY MOUTH 2 (TWO) TIMES DAILY. 02/24/13  Yes Neena Rhymes, MD  DALIRESP 500 MCG TABS tablet TAKE 1 TABLET (500 MCG TOTAL) BY MOUTH DAILY. 01/23/13  Yes Evie Lacks Plotnikov, MD  FLUoxetine (PROZAC) 40 MG capsule Take 40 mg by mouth 2 (two) times daily.    Yes Historical Provider, MD  fluticasone (FLONASE) 50 MCG/ACT nasal spray Place 2 sprays into both nostrils daily.   Yes Historical Provider, MD  furosemide (LASIX) 20 MG tablet Take 20 mg by mouth daily.   Yes Historical Provider, MD  gabapentin (NEURONTIN) 300 MG capsule Take 300 mg by mouth 4 (four) times daily. 600 mg every morning, 300 mg at noon, 600 mg at bedtime, and 300 mg at 1:00 am.   Yes Historical Provider, MD  hydrochlorothiazide (HYDRODIURIL) 25 MG tablet Take 25 mg by mouth daily before breakfast.    Yes Historical Provider, MD  HYDROmorphone (DILAUDID) 2 MG tablet Take 1 tablet (2 mg total) by mouth every 4 (four) hours as needed for pain. May take up to 2 tablets if needed. Fill on or after 03/04/13. 01/01/13  Yes Neena Rhymes, MD  losartan (COZAAR) 50 MG tablet Take 50 mg by mouth daily before breakfast.    Yes Historical Provider, MD  Melatonin ([employer] MELATONIN) 5 MG TABS Take 5 mg by  mouth at bedtime.    Yes Historical Provider, MD  OxyCODONE (OXYCONTIN) 20 mg T12A 12 hr tablet Take 20 mg by mouth every 12 (twelve) hours.   Yes Historical Provider, MD  pantoprazole (PROTONIX) 40 MG tablet Take 40 mg by mouth 2 (two) times daily.  07/29/12  Yes Neena Rhymes, MD  Polyethyl Glycol-Propyl Glycol (SYSTANE ULTRA) 0.4-0.3 % SOLN Apply 2 drops to eye 2 (two) times daily as needed (dry eyes).  Yes Historical Provider, MD  potassium chloride SA (K-DUR,KLOR-CON) 20 MEQ tablet Take 20 mEq by mouth daily.   Yes Historical Provider, MD  predniSONE (DELTASONE) 5 MG tablet Take 5 mg by mouth daily with breakfast.    Yes Historical Provider, MD  simvastatin (ZOCOR) 40 MG tablet Take 40 mg by mouth daily. At lunch   Yes Historical Provider, MD  traZODone (DESYREL) 100 MG tablet Take 100 mg by mouth at bedtime. Take just before bedtime   Yes Historical Provider, MD  traZODone (DESYREL) 50 MG tablet Take 50 mg by mouth at bedtime. Take when wakes up over in the night   Yes Historical Provider, MD  Elastic Bandages & Supports (ABDOMINAL BINDER/ELASTIC MED) MISC 1 Device by Does not apply route as needed. 08/06/12   Webb Silversmith, NP  losartan (COZAAR) 50 MG tablet TAKE 1 TABLET BY MOUTH ONCE A DAY 02/28/13   Neena Rhymes, MD   Allergies  Allergen Reactions  . Morphine And Related Shortness Of Breath, Nausea And Vomiting and Swelling  . Penicillins Hives, Shortness Of Breath and Rash    REACTION: hives, breathing problems  . Percocet [Oxycodone-Acetaminophen] Anxiety    FAMILY HISTORY:  Family History  Problem Relation Age of Onset  . Uterine cancer Mother     survivor  . Macular degeneration Mother   . Other Mother     ankle edema  . Lung cancer Mother   . Cancer Mother     ovarian, lung  . Coronary artery disease Father   . Hypertension Father   . Prostate cancer Father   . Colon polyps Father   . Cancer Father     prostate  . Thyroid disease Sister   . Heart attack  Brother   . Hyperlipidemia Brother   . Other Brother     Schizophrenic  . Diabetes Neg Hx   . Colon cancer Neg Hx   . Esophageal cancer Neg Hx   . Rectal cancer Neg Hx   . Stomach cancer Neg Hx   . Coronary artery disease Maternal Aunt   . Heart attack Maternal Aunt   . Rheum arthritis Sister   . Hemochromatosis Sister    SOCIAL HISTORY:  reports that he has never smoked. He has never used smokeless tobacco. He reports that he does not drink alcohol or use illicit drugs.  REVIEW OF SYSTEMS:  Constitutional: negative for anorexia, fevers and sweats  Eyes: negative for irritation, redness and visual disturbance  Ears, nose, mouth, throat, and face: negative for earaches, epistaxis, nasal congestion and sore throat  Respiratory: negative for cough, dyspnea on exertion, sputum and wheezing  Cardiovascular: negative for chest pain, dyspnea, lower extremity edema, orthopnea, palpitations and syncope  Gastrointestinal: negative for abdominal pain, constipation, diarrhea, melena, nausea and vomiting  Genitourinary:negative for dysuria, frequency and hematuria  Hematologic/lymphatic: negative for bleeding, easy bruising and lymphadenopathy  Musculoskeletal:negative for arthralgias, muscle weakness and stiff joints c/o back pain Neurological: negative for coordination problems, gait problems, headaches and weakness  Endocrine: negative for diabetic symptoms including polydipsia, polyuria and weight loss    SUBJECTIVE:   VITAL SIGNS: Temp:  [97 F (36.1 C)-98.7 F (37.1 C)] 98.6 F (37 C) (11/26 0800) Pulse Rate:  [51-113] 93 (11/26 1100) Resp:  [9-26] 26 (11/26 1100) BP: (63-163)/(45-143) 98/64 mmHg (11/26 1100) SpO2:  [87 %-100 %] 97 % (11/26 1100) Weight:  [158 lb 4.6 oz (71.8 kg)] 158 lb 4.6 oz (71.8 kg) (11/25 1736) HEMODYNAMICS:   VENTILATOR  SETTINGS:   INTAKE / OUTPUT: Intake/Output     11/25 0701 - 11/26 0700 11/26 0701 - 11/27 0700   P.O. 920 600   I.V. (mL/kg) 5378  (74.9) 300 (4.2)   Blood 800    IV Piggyback 2700 200   Total Intake(mL/kg) 9798 (136.5) 1100 (15.3)   Urine (mL/kg/hr) 2760 (1.6) 1400 (4.5)   Drains 390 (0.2)    Blood 1800 (1)    Total Output 4950 1400   Net +4848 -300          PHYSICAL EXAMINATION: General:  Awake and alert Neuro:  No acute deficients  HEENT:  No JVD/LAN Cardiovascular:  HSR RRR Lungs:  CTA Abdomen:  +bs , soft Musculoskeletal:  intact Skin:  Post back dressing intact  LABS:  CBC  Recent Labs Lab 03/02/13 1201 03/02/13 1656 03/03/13 0715  WBC  --  11.8* 9.7  HGB 10.9* 10.5* 7.8*  HCT 32.0* 29.7* 22.5*  PLT  --  149* 160   Coag's No results found for this basename: APTT, INR,  in the last 168 hours BMET  Recent Labs Lab 03/02/13 1000 03/02/13 1201 03/02/13 1656  NA 136 136 136  K 3.3* 3.4* 3.4*  CL  --   --  101  CO2  --   --  26  BUN  --   --  13  CREATININE  --   --  0.64  GLUCOSE 157* 159* 155*   Electrolytes  Recent Labs Lab 03/02/13 1656  CALCIUM 7.3*   Sepsis Markers No results found for this basename: LATICACIDVEN, PROCALCITON, O2SATVEN,  in the last 168 hours ABG No results found for this basename: PHART, PCO2ART, PO2ART,  in the last 168 hours Liver Enzymes No results found for this basename: AST, ALT, ALKPHOS, BILITOT, ALBUMIN,  in the last 168 hours Cardiac Enzymes No results found for this basename: TROPONINI, PROBNP,  in the last 168 hours Glucose No results found for this basename: GLUCAP,  in the last 168 hours  Imaging Dg Lumbar Spine 2-3 Views  03/02/2013   CLINICAL DATA:  L1-S1 posterior fixation.  EXAM: DG C-ARM 61-120 MIN; LUMBAR SPINE - 2-3 VIEW  COMPARISON:  12/29/2012 MRI.  FINDINGS: AP and lateral views. These demonstrate prior fixation at L2 through L5. A surgical device projects over the L5-S1 junction. There either a new screw or a surgical device projecting over the S1 level.  The AP view demonstrates multilevel lumbar fixation.  IMPRESSION:  Intraoperative imaging.   Electronically Signed   By: Abigail Miyamoto M.D.   On: 03/02/2013 14:50   Dg Lumbar Spine 1 View  03/02/2013   CLINICAL DATA:  History of lumbar radiculopathy, and stenosis.  EXAM: LUMBAR SPINE - 1 VIEW  COMPARISON:  None.  FINDINGS: In the status identified. Projecting at the level of the L5-S1 disc space. Skin retractors, are identified within the soft tissues of the posterior lumbar spine. Pedicular screws are appreciated at the L4 and L5 levels.  IMPRESSION: Hemostat projecting at the L5-S1 level.   Electronically Signed   By: Margaree Mackintosh M.D.   On: 03/02/2013 12:22   Dg C-arm 61-120 Min  03/02/2013   CLINICAL DATA:  L1-S1 posterior fixation.  EXAM: DG C-ARM 61-120 MIN; LUMBAR SPINE - 2-3 VIEW  COMPARISON:  12/29/2012 MRI.  FINDINGS: AP and lateral views. These demonstrate prior fixation at L2 through L5. A surgical device projects over the L5-S1 junction. There either a new screw or a surgical device projecting  over the S1 level.  The AP view demonstrates multilevel lumbar fixation.  IMPRESSION: Intraoperative imaging.   Electronically Signed   By: Abigail Miyamoto M.D.   On: 03/02/2013 14:50       ASSESSMENT / PLAN:  PULMONARY A: copd P:  Resume tudorza/ daliresp on dc atrovent /albut nebs while inpt   CARDIOVASCULAR A:Hypotension P:  -Hold antihypertensives -Hold diuretics -IVF bolus -Stress steroids -neo gtt being tapered, will hold off CVL  RENAL A:  Hypokalemia P:   -Replete and monitor  GASTROINTESTINAL A:  No acute issue P:     HEMATOLOGIC A:  Monitor for post op anemia P:  -transfuse for 7 or lower, or if remains hypotensive  INFECTIOUS A:  Empiric abx  P:   -see flows  ENDOCRINE A:  Presumed adrenal insufff  , unclear why on prednisone / RA vs DJD P:   -Solucortef  NEUROLOGIC A:  Post multilevel lumbar sugery P:   - Neurosurgery  -dc PCA & transition to POs  TODAY'S SUMMARY:  67 yo wm with chronic back pain and  underwent decompression fusion L1 to S1 11-25. He developed persistent hypotension 11-26 and proved refractory to IVF and required neo drip. Start stress dose steroids.  Care during the described time interval was provided by me and/or other providers on the critical care team.  I have reviewed this patient's available data, including medical history, events of note, physical examination and test results as part of my evaluation  CC time x  2m RKara MeadMD. FCCP. [facility] Pager [phone] no response call [phone]

## 2013-03-03 NOTE — Progress Notes (Signed)
Received results of CBC. Dr. Joya Salm on unit and notified of results. No new orders received. Will continue to monitor.

## 2013-03-03 NOTE — Consult Note (Signed)
Physical Medicine and Rehabilitation Consult  Reason for Consult: Lumbar DDD with radiculopathy Referring Physician: Dr. Joya Salm.    HPI: [name] is a 67 y.o. male with history of A fib, cerebellar ataxia, RA, h/o L4/5 decompression, chronic back pain radiating to BLE due to severe DDD L1-S1.  Patient elected to undergo lumbar decompression with fusion on 03/02/13.  Post op with hypotension requiring fluid resuscitation as well as neo synephrine for stabilization.  ABLA noted with hgb down to 7.8 today.   PT evaluation done today. PT, MD requesting CIR.    Review of Systems  HENT: Negative for hearing loss.   Eyes: Negative for blurred vision and double vision.  Respiratory: Negative for cough and shortness of breath.   Cardiovascular: Negative for chest pain and palpitations.  Gastrointestinal: Positive for abdominal pain and constipation.       Bloating and gas  Musculoskeletal: Positive for myalgias.  Neurological: Negative for dizziness, tingling and headaches.   Past Medical History  Diagnosis Date  . Hx of colonic polyps   . Diverticulosis   . Hypertension   . Osteoarthritis   . 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 CAD (coronary artery disease)     minimal coronary plaque in the LAD and right coronary system. 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 7-06. Last nuclear stress 11-17-06 fixed anterior/ inferior defect, no inducible ischemia, EF 81%  . Allergic rhinitis   . Hx of colonoscopy   . RA (rheumatoid arthritis)   . COPD (chronic obstructive pulmonary disease)   . PONV (postoperative nausea and vomiting)   . Dysrhythmia 01-24-12    past hx. A.Fib x1 episode-responded to med.  . Depression   . Anxiety     pt. feels its related to the medicine that he takes   . GERD (gastroesophageal reflux disease)     Past Surgical History  Procedure  Laterality Date  . Appendectomy    . Inguinal hernia repair  2008    Right, left remotely  . Total knee arthroplasty      right knee partial 2002  . Tonsillectomy    . Shoulder preplacement  2008    left, partial  . Ptca w/ coated stent lad      Cx secondary to tear  . Foot surgery  11-08    for removal of bone spurs- right foot  . Reconstruction of ankle  6-09    Right- [facility]  . L4-5 diskectomy w/ fusion, cage placement and rods  12-10    [place]  . Partial knee arthroplasty      left  . Total shoulder replacement      right  . Joint replacement    . Cataract extraction, bilateral  01-24-12    Bilateral  . Orif shoulder fracture  02/06/2012    Procedure: OPEN REDUCTION INTERNAL FIXATION (ORIF) SHOULDER FRACTURE;  Surgeon: Nita Sells, MD;  Location: [facility];  Service: Orthopedics;  Laterality: Left;  ORIF of a Left Shoulder Fracture with  Iliac Crest Bone Graft aspiration   . Harvest bone graft  02/06/2012    Procedure: HARVEST ILIAC BONE GRAFT;  Surgeon: Nita Sells, MD;  Location: [facility];  Service: Orthopedics;;  bone marrow aspirqation   . Hardware removal  03/09/2012    Procedure: HARDWARE REMOVAL;  Surgeon: Nita Sells, MD;  Location: [facility];  Service: Orthopedics;  Laterality: Left;  Hardware Removal from Left Shoulder  . Cardiac catheterization  2006    coronary stents- no problems  . Shoulder arthroscopy  2013  . Carpal tunnel release      R hand  . Ankle surgery Right 2008    at [facility], replacement   . Dg toes*r*      toe surgery- 07/2012  . Posterior lumbar fusion 4 level N/A 03/02/2013    Procedure: Lumbar One to Sacral One Posterior lumbar interbody fusion;  Surgeon: Floyce Stakes, MD;  Location: [place] [facility];  Service: Neurosurgery;  Laterality: N/A;  L1 to S1 Posterior lumbar interbody fusion    Family History  Problem Relation Age of Onset  . Uterine cancer Mother     survivor  . Macular degeneration  Mother   . Other Mother     ankle edema  . Lung cancer Mother   . Cancer Mother     ovarian, lung  . Coronary artery disease Father   . Hypertension Father   . Prostate cancer Father   . Colon polyps Father   . Cancer Father     prostate  . Thyroid disease Sister   . Heart attack Brother   . Hyperlipidemia Brother   . Other Brother     Schizophrenic  . Diabetes Neg Hx   . Colon cancer Neg Hx   . Esophageal cancer Neg Hx   . Rectal cancer Neg Hx   . Stomach cancer Neg Hx   . Coronary artery disease Maternal Aunt   . Heart attack Maternal Aunt   . Rheum arthritis Sister   . Hemochromatosis Sister     Social History:  Married. Works as a Hotel manager. He reports that he has never smoked. He has never used smokeless tobacco. He reports that he does not drink alcohol or use illicit drugs.   Allergies  Allergen Reactions  . Morphine And Related Shortness Of Breath, Nausea And Vomiting and Swelling  . Penicillins Hives, Shortness Of Breath and Rash    REACTION: hives, breathing problems  . Percocet [Oxycodone-Acetaminophen] Anxiety    Medications Prior to Admission  Medication Sig Dispense Refill  . Aclidinium Bromide (TUDORZA PRESSAIR) 400 MCG/ACT AEPB Inhale 1 puff into the lungs 2 (two) times daily.  1 each  11  . adalimumab (HUMIRA) 40 MG/0.8ML injection Inject 40 mg into the skin every 14 (fourteen) days.      . ALPRAZolam (XANAX) 0.5 MG tablet Take 0.5 mg by mouth 3 (three) times daily as needed for anxiety or sleep.      [name] aspirin (ASPIRIN EC) 81 MG EC tablet Take 81 mg by mouth every morning.       . celecoxib (CELEBREX) 200 MG capsule Take 200 mg by mouth 2 (two) times daily.      . cholecalciferol (VITAMIN D) 1000 UNITS tablet Take 1 tablet (1,000 Units total) by mouth daily.  100 tablet  3  . COLCRYS 0.6 MG tablet TAKE 1 TABLET (0.6 MG TOTAL) BY MOUTH 2 (TWO) TIMES DAILY.  60 tablet  5  . DALIRESP 500 MCG TABS tablet TAKE 1 TABLET (500 MCG TOTAL) BY MOUTH DAILY.  30  tablet  5  . FLUoxetine (PROZAC) 40 MG capsule Take 40 mg by mouth 2 (two) times daily.       . fluticasone (FLONASE) 50 MCG/ACT nasal spray Place 2 sprays into both nostrils daily.      . furosemide (  LASIX) 20 MG tablet Take 20 mg by mouth daily.      [name] gabapentin (NEURONTIN) 300 MG capsule Take 300 mg by mouth 4 (four) times daily. 600 mg every morning, 300 mg at noon, 600 mg at bedtime, and 300 mg at 1:00 am.      . hydrochlorothiazide (HYDRODIURIL) 25 MG tablet Take 25 mg by mouth daily before breakfast.       . HYDROmorphone (DILAUDID) 2 MG tablet Take 1 tablet (2 mg total) by mouth every 4 (four) hours as needed for pain. May take up to 2 tablets if needed. Fill on or after 03/04/13.  60 tablet  0  . losartan (COZAAR) 50 MG tablet Take 50 mg by mouth daily before breakfast.       . Melatonin ([employer] MELATONIN) 5 MG TABS Take 5 mg by mouth at bedtime.       . OxyCODONE (OXYCONTIN) 20 mg T12A 12 hr tablet Take 20 mg by mouth every 12 (twelve) hours.      . pantoprazole (PROTONIX) 40 MG tablet Take 40 mg by mouth 2 (two) times daily.       Vladimir Faster Glycol-Propyl Glycol (SYSTANE ULTRA) 0.4-0.3 % SOLN Apply 2 drops to eye 2 (two) times daily as needed (dry eyes).       . potassium chloride SA (K-DUR,KLOR-CON) 20 MEQ tablet Take 20 mEq by mouth daily.      . predniSONE (DELTASONE) 5 MG tablet Take 5 mg by mouth daily with breakfast.       . simvastatin (ZOCOR) 40 MG tablet Take 40 mg by mouth daily. At lunch      . traZODone (DESYREL) 100 MG tablet Take 100 mg by mouth at bedtime. Take just before bedtime      . traZODone (DESYREL) 50 MG tablet Take 50 mg by mouth at bedtime. Take when wakes up over in the night      . Elastic Bandages & Supports (ABDOMINAL BINDER/ELASTIC MED) MISC 1 Device by Does not apply route as needed.  1 each  0  . losartan (COZAAR) 50 MG tablet TAKE 1 TABLET BY MOUTH ONCE A DAY  90 tablet  3    Home: Home Living Family/patient expects to be discharged to:: Other  (Comment) Living Arrangements: Spouse/significant other Available Help at Discharge: Family;Available PRN/intermittently Type of Home: House Home Access: Stairs to enter [employer] of Steps: 5 Entrance Stairs-Rails: Right Home Layout: Two level;Able to live on main level with bedroom/bathroom Home Equipment: Gilford Rile - 2 wheels;Cane - single point  Lives With: Significant other  Functional History: Prior Function Comments: reports decrease function and mobility due to pain Functional Status:  Mobility: Bed Mobility Bed Mobility: Not assessed (patient seated in chair on arrival) Transfers Transfers: Stand to Sit;Sit to Stand Sit to Stand: 4: Min guard;From chair/3-in-1;With upper extremity assist Stand to Sit: To chair/3-in-1;With upper extremity assist;4: Min guard Ambulation/Gait Ambulation/Gait Assistance: 4: Min guard Ambulation Distance (Feet): 150 Feet Assistive device: Rolling walker Ambulation/Gait Assistance Details: cues to step inside the RW; cues to slow down for safety Gait Pattern: Step-through pattern;Wide base of support Gait velocity: walks quickly General Gait Details: uses momentum rather than controlled mvmts;  Stairs: No    ADL:    Cognition: Cognition Overall Cognitive Status: Within Functional Limits for tasks assessed Orientation Level: Oriented X4 Cognition Arousal/Alertness: Awake/alert Behavior During Therapy: WFL for tasks assessed/performed Overall Cognitive Status: Within Functional Limits for tasks assessed  Blood pressure 108/59, pulse 100, temperature  97.7 F (36.5 C), temperature source Oral, resp. rate 15, height 5' 6"  (1.676 m), weight 71.8 kg (158 lb 4.6 oz), SpO2 92.00%. Physical Exam  Nursing note and vitals reviewed. Constitutional: He is oriented to person, place, and time. He appears well-developed and well-nourished.  HENT:  Head: Normocephalic and atraumatic.  Eyes: Pupils are equal, round, and reactive to light.   Neck: Normal range of motion. Neck supple.  Cardiovascular: Normal rate and regular rhythm.   Respiratory: Breath sounds normal. He has no wheezes.  GI: He exhibits distension. Bowel sounds are decreased. There is tenderness.  Musculoskeletal: He exhibits no edema and no tenderness.  Well healed old TKR incisions. Right foot deformity due to prior fracture.  Decreased ROM bilateral shoulders.  Neurological: He is alert and oriented to person, place, and time.  Skin: Skin is warm and dry.   sensory intact to light touch and proprioception in both feet Motor strength 4 minus/5 bilateral deltoid, 5/5 bicep triceps grip /5 bilateral hip flexor knee extensor ankle dorsiflexor plantar flexor  Results for orders placed during the hospital encounter of 03/02/13 (from the past 24 hour(s))  CBC     Status: Abnormal   Collection Time    03/02/13  4:56 PM      Result Value Range   WBC 11.8 (*) 4.0 - 10.5 K/uL   RBC 3.31 (*) 4.22 - 5.81 MIL/uL   Hemoglobin 10.5 (*) 13.0 - 17.0 g/dL   HCT 29.7 (*) 39.0 - 52.0 %   MCV 89.7  78.0 - 100.0 fL   MCH 31.7  26.0 - 34.0 pg   MCHC 35.4  30.0 - 36.0 g/dL   RDW 13.8  11.5 - 15.5 %   Platelets 149 (*) 150 - 400 K/uL  BASIC METABOLIC PANEL     Status: Abnormal   Collection Time    03/02/13  4:56 PM      Result Value Range   Sodium 136  135 - 145 mEq/L   Potassium 3.4 (*) 3.5 - 5.1 mEq/L   Chloride 101  96 - 112 mEq/L   CO2 26  19 - 32 mEq/L   Glucose, Bld 155 (*) 70 - 99 mg/dL   BUN 13  6 - 23 mg/dL   Creatinine, Ser 0.64  0.50 - 1.35 mg/dL   Calcium 7.3 (*) 8.4 - 10.5 mg/dL   GFR calc non Af Amer >90  >90 mL/min   GFR calc Af Amer >90  >90 mL/min  CBC     Status: Abnormal   Collection Time    03/03/13  7:15 AM      Result Value Range   WBC 9.7  4.0 - 10.5 K/uL   RBC 2.50 (*) 4.22 - 5.81 MIL/uL   Hemoglobin 7.8 (*) 13.0 - 17.0 g/dL   HCT 22.5 (*) 39.0 - 52.0 %   MCV 90.0  78.0 - 100.0 fL   MCH 31.2  26.0 - 34.0 pg   MCHC 34.7  30.0 - 36.0  g/dL   RDW 14.3  11.5 - 15.5 %   Platelets 160  150 - 400 K/uL   Dg Lumbar Spine 2-3 Views  03/02/2013   CLINICAL DATA:  L1-S1 posterior fixation.  EXAM: DG C-ARM 61-120 MIN; LUMBAR SPINE - 2-3 VIEW  COMPARISON:  12/29/2012 MRI.  FINDINGS: AP and lateral views. These demonstrate prior fixation at L2 through L5. A surgical device projects over the L5-S1 junction. There either a new screw or a surgical device  projecting over the S1 level.  The AP view demonstrates multilevel lumbar fixation.  IMPRESSION: Intraoperative imaging.   Electronically Signed   By: Abigail Miyamoto M.D.   On: 03/02/2013 14:50   Dg Lumbar Spine 1 View  03/02/2013   CLINICAL DATA:  History of lumbar radiculopathy, and stenosis.  EXAM: LUMBAR SPINE - 1 VIEW  COMPARISON:  None.  FINDINGS: In the status identified. Projecting at the level of the L5-S1 disc space. Skin retractors, are identified within the soft tissues of the posterior lumbar spine. Pedicular screws are appreciated at the L4 and L5 levels.  IMPRESSION: Hemostat projecting at the L5-S1 level.   Electronically Signed   By: Margaree Mackintosh M.D.   On: 03/02/2013 12:22   Dg C-arm 61-120 Min  03/02/2013   CLINICAL DATA:  L1-S1 posterior fixation.  EXAM: DG C-ARM 61-120 MIN; LUMBAR SPINE - 2-3 VIEW  COMPARISON:  12/29/2012 MRI.  FINDINGS: AP and lateral views. These demonstrate prior fixation at L2 through L5. A surgical device projects over the L5-S1 junction. There either a new screw or a surgical device projecting over the S1 level.  The AP view demonstrates multilevel lumbar fixation.  IMPRESSION: Intraoperative imaging.   Electronically Signed   By: Abigail Miyamoto M.D.   On: 03/02/2013 14:50    Assessment/Plan: Diagnosis: Lumbar degenerative disc as well as lumbar radiculopathy status post L1 through S1 decompression fusion 1. Does the need for close, 24 hr/day medical supervision in concert with the patient's rehab needs make it unreasonable for this patient to be served  in a less intensive setting? No 2. Co-Morbidities requiring supervision/potential complications: Atrial fibrillation ,RA 3. Due to Not applicable, does the patient require 24 hr/day rehab nursing? No 4. Does the patient require coordinated care of a physician, rehab nurse, N/A to address physical and functional deficits in the context of the above medical diagnosis(es)? No Addressing deficits in the following areas: Not applicable 5. Can the patient actively participate in an intensive therapy program of at least 3 hrs of therapy per day at least 5 days per week? Yes 6. The potential for patient to make measurable gains while on inpatient rehab is Not applicable 7. Anticipated functional outcomes upon discharge from inpatient rehab are not applicable with PT, not applicable with OT, not applicable with SLP. 8. Estimated rehab length of stay to reach the above functional goals is: Not applicable 9. Does the patient have adequate social supports to accommodate these discharge functional goals? Potentially 10. Anticipated D/C setting: Home 11. Anticipated post D/C treatments: [place] therapy 12. Overall Rehab/Functional Prognosis: good  RECOMMENDATIONS: This patient's condition is appropriate for continued rehabilitative care in the following setting: If wife does not feel she can provide 24 7 assist than short term SNF otherwise should be undergo home with home health therapy Patient has agreed to participate in recommended program. Yes Note that insurance prior authorization may be required for reimbursement for recommended care.  Comment: Too high level for CIR    03/03/2013

## 2013-03-03 NOTE — Progress Notes (Signed)
Utilization review completed. Nguyen Todorov, RN, BSN. 

## 2013-03-04 DIAGNOSIS — M5137 Other intervertebral disc degeneration, lumbosacral region: Secondary | ICD-10-CM

## 2013-03-04 DIAGNOSIS — IMO0002 Reserved for concepts with insufficient information to code with codable children: Secondary | ICD-10-CM

## 2013-03-04 LAB — CBC
HCT: 20.9 % — ABNORMAL LOW (ref 39.0–52.0)
Hemoglobin: 7.1 g/dL — ABNORMAL LOW (ref 13.0–17.0)
MCH: 31.6 pg (ref 26.0–34.0)
MCHC: 34 g/dL (ref 30.0–36.0)
MCV: 92.9 fL (ref 78.0–100.0)
Platelets: 144 10*3/uL — ABNORMAL LOW (ref 150–400)
RBC: 2.25 MIL/uL — ABNORMAL LOW (ref 4.22–5.81)
RDW: 14.5 % (ref 11.5–15.5)
WBC: 6.4 10*3/uL (ref 4.0–10.5)

## 2013-03-04 LAB — BASIC METABOLIC PANEL
BUN: 6 mg/dL (ref 6–23)
CO2: 29 mEq/L (ref 19–32)
Calcium: 7.4 mg/dL — ABNORMAL LOW (ref 8.4–10.5)
Chloride: 106 mEq/L (ref 96–112)
Creatinine, Ser: 0.51 mg/dL (ref 0.50–1.35)
GFR calc Af Amer: >90 mL/min (ref >90)
GFR calc non Af Amer: >90 mL/min (ref >90)
Glucose, Bld: 126 mg/dL — ABNORMAL HIGH (ref 70–99)
Potassium: 3 mEq/L — ABNORMAL LOW (ref 3.5–5.1)
Sodium: 139 mEq/L (ref 135–145)

## 2013-03-04 MED ORDER — POTASSIUM CHLORIDE 10 MEQ/100ML IV SOLN
10.0000 meq | INTRAVENOUS | Status: AC
  Administered 2013-03-04 (×4): 10 meq via INTRAVENOUS
  Filled 2013-03-04 (×4): qty 100

## 2013-03-04 MED ORDER — POTASSIUM CHLORIDE CRYS ER 20 MEQ PO TBCR
40.0000 meq | EXTENDED_RELEASE_TABLET | Freq: Three times a day (TID) | ORAL | Status: AC
  Administered 2013-03-04 (×2): 40 meq via ORAL
  Filled 2013-03-04 (×3): qty 2

## 2013-03-04 MED ORDER — METHOCARBAMOL 500 MG PO TABS
750.0000 mg | ORAL_TABLET | Freq: Four times a day (QID) | ORAL | Status: DC | PRN
  Administered 2013-03-04 – 2013-03-10 (×14): 750 mg via ORAL
  Filled 2013-03-04: qty 1
  Filled 2013-03-04 (×2): qty 2
  Filled 2013-03-04 (×2): qty 1
  Filled 2013-03-04: qty 2
  Filled 2013-03-04: qty 1
  Filled 2013-03-04 (×8): qty 2

## 2013-03-04 MED ORDER — HYDROCHLOROTHIAZIDE 25 MG PO TABS
25.0000 mg | ORAL_TABLET | Freq: Every day | ORAL | Status: DC
  Administered 2013-03-05 – 2013-03-10 (×6): 25 mg via ORAL
  Filled 2013-03-04 (×7): qty 1

## 2013-03-04 MED ORDER — LOSARTAN POTASSIUM 50 MG PO TABS
50.0000 mg | ORAL_TABLET | Freq: Every day | ORAL | Status: DC
  Administered 2013-03-04 – 2013-03-09 (×6): 50 mg via ORAL
  Filled 2013-03-04 (×7): qty 1

## 2013-03-04 MED ORDER — FLEET ENEMA 7-19 GM/118ML RE ENEM
1.0000 | ENEMA | Freq: Every day | RECTAL | Status: DC | PRN
  Administered 2013-03-05 – 2013-03-10 (×2): 1 via RECTAL
  Filled 2013-03-04 (×2): qty 1

## 2013-03-04 MED ORDER — PREDNISONE 5 MG PO TABS
5.0000 mg | ORAL_TABLET | Freq: Every day | ORAL | Status: DC
  Administered 2013-03-05 – 2013-03-10 (×6): 5 mg via ORAL
  Filled 2013-03-04 (×7): qty 1

## 2013-03-04 NOTE — Progress Notes (Signed)
[facility] ADULT ICU REPLACEMENT PROTOCOL FOR AM LAB REPLACEMENT ONLY  The patient does apply for the [facility] Adult ICU Electrolyte Replacment Protocol based on the criteria listed below:   1. Is GFR >/= 40 ml/min? yes  Patient's GFR today is >90 2. Is urine output >/= 0.5 ml/kg/hr for the last 6 hours? yes Patient's UOP is 1.68 ml/kg/hr 3. Is BUN < 60 mg/dL? yes  Patient's BUN today is 6 4. Abnormal electrolyte(s): K+ 3.0 5. Ordered repletion with: see orders 6. If a panic level lab has been reported, has the CCM MD in charge been notified? yes.   Physician:  Dr. Jodi Geralds, Teddie Mehta A 03/04/2013 6:22 AM

## 2013-03-04 NOTE — Progress Notes (Signed)
Patient ID: [name], male   DOB: Sep 19, 1945, 67 y.o.   MRN: [id number] C/o constipation no weakness. Seen by rehabilitation medicine

## 2013-03-04 NOTE — Progress Notes (Signed)
PULMONARY  / CRITICAL CARE MEDICINE  Name: [name] MRN: [id number] DOB: 1945/10/25    ADMISSION DATE:  03/02/2013 CONSULTATION DATE:  11-26  REFERRING MD :  neuro PRIMARY SERVICE: Neuro  CHIEF COMPLAINT:  Shock  BRIEF PATIENT DESCRIPTION:  67 yo wm with chronic back pain and underwent decompression fusion L1 to S1 11-25. He developed persistent hypotension 11-26 and proved refractory to IVF and required neo drip. We note he is on chronic steroids. PCCM asked to assist in his in care. PMH - CAD, COPD, RA, chronic pain  SIGNIFICANT EVENTS / STUDIES:  11-26 hypotension  LINES / TUBES: PIV  CULTURES: None  ANTIBIOTICS: 11-25 vanco>>  SUBJECTIVE: Sleepy but arousable this AM  VITAL SIGNS: Temp:  [97.7 F (36.5 C)-99 F (37.2 C)] 98.2 F (36.8 C) (11/27 0422) Pulse Rate:  [87-117] 93 (11/27 0700) Resp:  [15-29] 21 (11/27 0700) BP: (87-134)/(52-85) 107/57 mmHg (11/27 0700) SpO2:  [88 %-100 %] 97 % (11/27 0700) HEMODYNAMICS:   VENTILATOR SETTINGS:   INTAKE / OUTPUT: Intake/Output     11/26 0701 - 11/27 0700 11/27 0701 - 11/28 0700   P.O. 1360    I.V. (mL/kg) 1779 (24.8)    Blood     IV Piggyback 500    Total Intake(mL/kg) 3639 (50.7)    Urine (mL/kg/hr) 5080 (2.9)    Drains 300 (0.2)    Blood     Total Output 5380     Net -1741           PHYSICAL EXAMINATION: General: Sleepy but arousable. Neuro:  WNL HEENT:  No JVD/LAN Cardiovascular:  HSR RRR Lungs:  CTA Abdomen:  +bs , soft, ND, ND and +BS. Musculoskeletal:  intact Skin:  Post back dressing intact  LABS:  CBC  Recent Labs Lab 03/03/13 0715 03/03/13 1715 03/04/13 0400  WBC 9.7 9.4 6.4  HGB 7.8* 8.8* 7.1*  HCT 22.5* 25.3* 20.9*  PLT 160 164 144*   Coag's No results found for this basename: APTT, INR,  in the last 168 hours BMET  Recent Labs Lab 03/02/13 1656 03/03/13 1715 03/04/13 0400  NA 136 138 139  K 3.4* 3.5 3.0*  CL 101 104 106  CO2 26 26 29   BUN 13 5* 6   CREATININE 0.64 0.55 0.51  GLUCOSE 155* 170* 126*   Electrolytes  Recent Labs Lab 03/02/13 1656 03/03/13 1715 03/04/13 0400  CALCIUM 7.3* 7.6* 7.4*   Sepsis Markers No results found for this basename: LATICACIDVEN, PROCALCITON, O2SATVEN,  in the last 168 hours ABG No results found for this basename: PHART, PCO2ART, PO2ART,  in the last 168 hours Liver Enzymes No results found for this basename: AST, ALT, ALKPHOS, BILITOT, ALBUMIN,  in the last 168 hours Cardiac Enzymes No results found for this basename: TROPONINI, PROBNP,  in the last 168 hours Glucose No results found for this basename: GLUCAP,  in the last 168 hours  Imaging Dg Lumbar Spine 2-3 Views  03/02/2013   CLINICAL DATA:  L1-S1 posterior fixation.  EXAM: DG C-ARM 61-120 MIN; LUMBAR SPINE - 2-3 VIEW  COMPARISON:  12/29/2012 MRI.  FINDINGS: AP and lateral views. These demonstrate prior fixation at L2 through L5. A surgical device projects over the L5-S1 junction. There either a new screw or a surgical device projecting over the S1 level.  The AP view demonstrates multilevel lumbar fixation.  IMPRESSION: Intraoperative imaging.   Electronically Signed   By: Abigail Miyamoto M.D.   On: 03/02/2013 14:50  Dg Lumbar Spine 1 View  03/02/2013   CLINICAL DATA:  History of lumbar radiculopathy, and stenosis.  EXAM: LUMBAR SPINE - 1 VIEW  COMPARISON:  None.  FINDINGS: In the status identified. Projecting at the level of the L5-S1 disc space. Skin retractors, are identified within the soft tissues of the posterior lumbar spine. Pedicular screws are appreciated at the L4 and L5 levels.  IMPRESSION: Hemostat projecting at the L5-S1 level.   Electronically Signed   By: Margaree Mackintosh M.D.   On: 03/02/2013 12:22   Dg C-arm 61-120 Min  03/02/2013   CLINICAL DATA:  L1-S1 posterior fixation.  EXAM: DG C-ARM 61-120 MIN; LUMBAR SPINE - 2-3 VIEW  COMPARISON:  12/29/2012 MRI.  FINDINGS: AP and lateral views. These demonstrate prior fixation at L2  through L5. A surgical device projects over the L5-S1 junction. There either a new screw or a surgical device projecting over the S1 level.  The AP view demonstrates multilevel lumbar fixation.  IMPRESSION: Intraoperative imaging.   Electronically Signed   By: Abigail Miyamoto M.D.   On: 03/02/2013 14:50       ASSESSMENT / PLAN:  PULMONARY A: copd P:   - Resume tudorza/ daliresp on dc - Atrovent /albut nebs while inpt  CARDIOVASCULAR A: Hypotension anticipate due to low narcotics but adequate UOP for now. P:  - Hold antihypertensives given BP. - Hold diuretics given BP. - IVF NS at 75 ml/hr, will continue. - Stress steroids as ordered. - Remains on neo but dose decreasing, will continue to hold off CVL for now as anticipate will be off by the end of the day.  RENAL A:  Hypokalemia P:   - Replete and monitor.  GASTROINTESTINAL A:  No acute issue P:     HEMATOLOGIC A:  Hg low, no evidence of bleeding, likely due to hemodilution for now. P:  - Transfuse for 7 or lower. - CBC in AM.  INFECTIOUS A:  Empiric abx  P:   - See flows  ENDOCRINE A:  Presumed adrenal insufff  , unclear why on prednisone / RA vs DJD P:   - Solucortef while hypotensive. - Cortisol level not sent, will continue stress dose steroids for now given low BP.  NEUROLOGIC A:  Post multilevel lumbar sugery P:   - Per Neurosurgery. - Limit narcotics as able given low BP.  TODAY'S SUMMARY:  67 yo wm with chronic back pain and underwent decompression fusion L1 to S1 11-25. He developed persistent hypotension 11-26 and proved refractory to IVF and required neo drip. Continue stress dose steroids.  Taper down neo, if unable to by the end of the day will need to consider TLC placement.  Care during the described time interval was provided by me and/or other providers on the critical care team.  I have reviewed this patient's available data, including medical history, events of note, physical examination and  test results as part of my evaluation  CC time x 35 min.  Rush Farmer, M.D. [facility] Pulmonary/Critical Care Medicine. Pager: [phone]. After hours pager: [phone].

## 2013-03-04 NOTE — Progress Notes (Signed)
Patient ID: TYLOR COURTWRIGHT, male   DOB: 09/24/1945, 67 y.o.   MRN: [id number] Subjective: Patient reports feeling well  Objective: Vital signs in last 24 hours: Temp:  [97.7 F (36.5 C)-99 F (37.2 C)] 97.9 F (36.6 C) (11/27 0800) Pulse Rate:  [87-117] 87 (11/27 0900) Resp:  [15-29] 23 (11/27 0900) BP: (93-134)/(52-85) 111/66 mmHg (11/27 0900) SpO2:  [88 %-100 %] 98 % (11/27 0900)  Intake/Output from previous day: 11/26 0701 - 11/27 0700 In: 3639 [P.O.:1360; I.V.:1779; IV Piggyback:500] Out: 5380 [Urine:5080; Drains:300] Intake/Output this shift: Total I/O In: 175 [I.V.:75; IV Piggyback:100] Out: -   awake, alert'no new neuro issues  Lab Results:  Recent Labs  03/03/13 1715 03/04/13 0400  WBC 9.4 6.4  HGB 8.8* 7.1*  HCT 25.3* 20.9*  PLT 164 144*   BMET  Recent Labs  03/03/13 1715 03/04/13 0400  NA 138 139  K 3.5 3.0*  CL 104 106  CO2 26 29  GLUCOSE 170* 126*  BUN 5* 6  CREATININE 0.55 0.51  CALCIUM 7.6* 7.4*    Studies/Results: Dg Lumbar Spine 2-3 Views  03/02/2013   CLINICAL DATA:  L1-S1 posterior fixation.  EXAM: DG C-ARM 61-120 MIN; LUMBAR SPINE - 2-3 VIEW  COMPARISON:  12/29/2012 MRI.  FINDINGS: AP and lateral views. These demonstrate prior fixation at L2 through L5. A surgical device projects over the L5-S1 junction. There either a new screw or a surgical device projecting over the S1 level.  The AP view demonstrates multilevel lumbar fixation.  IMPRESSION: Intraoperative imaging.   Electronically Signed   By: Abigail Miyamoto M.D.   On: 03/02/2013 14:50   Dg Lumbar Spine 1 View  03/02/2013   CLINICAL DATA:  History of lumbar radiculopathy, and stenosis.  EXAM: LUMBAR SPINE - 1 VIEW  COMPARISON:  None.  FINDINGS: In the status identified. Projecting at the level of the L5-S1 disc space. Skin retractors, are identified within the soft tissues of the posterior lumbar spine. Pedicular screws are appreciated at the L4 and L5 levels.  IMPRESSION: Hemostat  projecting at the L5-S1 level.   Electronically Signed   By: Margaree Mackintosh M.D.   On: 03/02/2013 12:22   Dg C-arm 61-120 Min  03/02/2013   CLINICAL DATA:  L1-S1 posterior fixation.  EXAM: DG C-ARM 61-120 MIN; LUMBAR SPINE - 2-3 VIEW  COMPARISON:  12/29/2012 MRI.  FINDINGS: AP and lateral views. These demonstrate prior fixation at L2 through L5. A surgical device projects over the L5-S1 junction. There either a new screw or a surgical device projecting over the S1 level.  The AP view demonstrates multilevel lumbar fixation.  IMPRESSION: Intraoperative imaging.   Electronically Signed   By: Abigail Miyamoto M.D.   On: 03/02/2013 14:50    Assessment/Plan: Doing better. OK for transfer from unit whenever CCM says it is ok.   LOS: 2 days  as above   Faythe Ghee, MD 03/04/2013, 9:31 AM

## 2013-03-04 NOTE — Progress Notes (Signed)
eLink Physician-Brief Progress Note Patient Name: [name] DOB: 1945/12/26 MRN: [id number]  Date of Service  03/04/2013   HPI/Events of Note  Hypotension, oliguria -resolved Now hypertensive   eICU Interventions  Resume losartan, hctz Resume po prednisone, dc stress dose steroids OK to transfer   Intervention Category Intermediate Interventions: Hypertension - evaluation and management  Saraih Lorton V. 03/04/2013, 3:53 PM

## 2013-03-05 DIAGNOSIS — I10 Essential (primary) hypertension: Secondary | ICD-10-CM

## 2013-03-05 LAB — CBC
HCT: 24.9 % — ABNORMAL LOW (ref 39.0–52.0)
Hemoglobin: 8.4 g/dL — ABNORMAL LOW (ref 13.0–17.0)
MCH: 31.5 pg (ref 26.0–34.0)
MCHC: 33.7 g/dL (ref 30.0–36.0)
MCV: 93.3 fL (ref 78.0–100.0)
Platelets: 214 10*3/uL (ref 150–400)
RBC: 2.67 MIL/uL — ABNORMAL LOW (ref 4.22–5.81)
RDW: 14.3 % (ref 11.5–15.5)
WBC: 7.9 10*3/uL (ref 4.0–10.5)

## 2013-03-05 LAB — BASIC METABOLIC PANEL
BUN: 7 mg/dL (ref 6–23)
CO2: 29 mEq/L (ref 19–32)
Calcium: 8 mg/dL — ABNORMAL LOW (ref 8.4–10.5)
Chloride: 104 mEq/L (ref 96–112)
Creatinine, Ser: 0.53 mg/dL (ref 0.50–1.35)
GFR calc Af Amer: >90 mL/min (ref >90)
GFR calc non Af Amer: >90 mL/min (ref >90)
Glucose, Bld: 102 mg/dL — ABNORMAL HIGH (ref 70–99)
Potassium: 3.6 mEq/L (ref 3.5–5.1)
Sodium: 138 mEq/L (ref 135–145)

## 2013-03-05 LAB — MAGNESIUM: Magnesium: 2.3 mg/dL (ref 1.5–2.5)

## 2013-03-05 LAB — PHOSPHORUS: Phosphorus: 1.8 mg/dL — ABNORMAL LOW (ref 2.3–4.6)

## 2013-03-05 MED ORDER — POTASSIUM PHOSPHATE DIBASIC 3 MMOLE/ML IV SOLN
20.0000 mmol | Freq: Once | INTRAVENOUS | Status: AC
  Administered 2013-03-05: 20 mmol via INTRAVENOUS
  Filled 2013-03-05: qty 6.67

## 2013-03-05 NOTE — Progress Notes (Signed)
ANTIBIOTIC CONSULT NOTE - INITIAL  Pharmacy Consult for vancomycin Indication:  S/p lumbar fusion - post op propylaxis  Allergies  Allergen Reactions  . Morphine And Related Shortness Of Breath, Nausea And Vomiting and Swelling  . Penicillins Hives, Shortness Of Breath and Rash    REACTION: hives, breathing problems  . Percocet [Oxycodone-Acetaminophen] Anxiety    Patient Measurements: Height: 5' 6"  (167.6 cm) Weight: 158 lb 4.6 oz (71.8 kg) IBW/kg (Calculated) : 63.8   Vital Signs: Temp: 97.9 F (36.6 C) (11/28 0400) Temp src: Oral (11/28 0400) BP: 132/65 mmHg (11/28 0800) Pulse Rate: 92 (11/28 1000) Intake/Output from previous day: 11/27 0701 - 11/28 0700 In: 1950 [P.O.:1200; I.V.:450; IV Piggyback:300] Out: 2400 [Urine:2400] Intake/Output from this shift: Total I/O In: 400 [P.O.:200; IV Piggyback:200] Out: 375 [Urine:375]  Labs:  Recent Labs  03/03/13 1715 03/04/13 0400 03/05/13 0355  WBC 9.4 6.4 7.9  HGB 8.8* 7.1* 8.4*  PLT 164 144* 214  CREATININE 0.55 0.51 0.53   Estimated Creatinine Clearance: 83.1 ml/min (by C-G formula based on Cr of 0.53). No results found for this basename: VANCOTROUGH, Corlis Leak, VANCORANDOM, GENTTROUGH, GENTPEAK, GENTRANDOM, TOBRATROUGH, TOBRAPEAK, TOBRARND, AMIKACINPEAK, AMIKACINTROU, AMIKACIN,  in the last 72 hours   Microbiology: Recent Results (from the past 720 hour(s))  SURGICAL PCR SCREEN     Status: None   Collection Time    02/23/13 11:22 AM      Result Value Range Status   MRSA, PCR NEGATIVE  NEGATIVE Final   Staphylococcus aureus NEGATIVE  NEGATIVE Final   Comment:            The Xpert SA Assay (FDA     approved for NASAL specimens     in patients over 26 years of age),     is one component of     a comprehensive surveillance     program.  Test performance has     been validated by [employer] for patients greater     than or equal to 76 year old.     It is not intended     to diagnose infection nor to     guide or monitor treatment.    Medical History: Past Medical History  Diagnosis Date  . Hx of colonic polyps   . Diverticulosis   . Hypertension   . Osteoarthritis   . 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 CAD (coronary artery disease)     minimal coronary plaque in the LAD and right coronary system. 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 7-06. Last nuclear stress 11-17-06 fixed anterior/ inferior defect, no inducible ischemia, EF 81%  . Allergic rhinitis   . Hx of colonoscopy   . RA (rheumatoid arthritis)   . COPD (chronic obstructive pulmonary disease)   . PONV (postoperative nausea and vomiting)   . Dysrhythmia 01-24-12    past hx. A.Fib x1 episode-responded to med.  . Depression   . Anxiety     pt. feels its related to the medicine that he takes   . GERD (gastroesophageal reflux disease)     Assessment: 67yom with longstanding lumbar pain.  S/p lumbar fusion.  Has been on vanc for post-op. Drain is out per [employer].   Goal of Therapy:  Vancomycin trough 10-15  Plan:   Consider stopping vanc at this point  Onnie Boer, PharmD  Pager: [phone] 03/05/2013 11:08 AM

## 2013-03-05 NOTE — Progress Notes (Signed)
Rehab admissions - Evaluated for possible admission.  Please see rehab consult done by Dr. Letta Pate.  Patient is already doing too well to admit to acute inpatient rehab.  Options will be SNF or home with [facility] therapies.  Call me for questions.  #[phone]

## 2013-03-05 NOTE — Clinical Documentation Improvement (Signed)
THIS DOCUMENT IS NOT A PERMANENT PART OF THE MEDICAL RECORD  Please update your documentation with the medical record to reflect your response to this query. If you need help knowing how to do this please call [phone].  03/05/13   To Dr. Leanna Sato [name]/ [facility]   In a better effort to capture your patient's severity of illness, reflect appropriate length of stay and utilization of resources, a review of the patient medical record has revealed the following indicators.    Based on your clinical judgment, please clarify and document in a progress note and/or discharge summary the clinical condition associated with the following supporting information:  In responding to this query please exercise your independent judgment.  The fact that a query is asked, does not imply that any particular answer is desired or expected.     Consult note for "shock" if appropriate please document type.  Patient per anesthesia note loss 800 cc EBL  And given 200 cc of PRBC per cell saver and 500 ml of Albumin, started on Neo -Synephrine IV for hypotension.  Thank you    Possible Clinical Conditions?   " Hypovolemic Shock  " Hemorrhagic Shock  " Other Condition  " Cannot Clinically determine      You may use possible, probable, or suspect with inpatient documentation. possible, probable, suspected diagnoses MUST be documented at the time of discharge  Reviewed:  no additional documentation provided  Thank You,  [place]  Clinical Documentation Specialist: Roland

## 2013-03-05 NOTE — Clinical Social Work Note (Addendum)
CSW faxed patient's clinicals to skilled facilities in [facility] per request of [place] who is working with patient/family. CSW will follow-up with family regarding facility responses and discharge plans. PASARR submitted and FL-2 and 30-Day note placed in shadow chart for MD signature.  Goodwin [place]ph[place] Givens, MSW, LCSW [phone]

## 2013-03-05 NOTE — Progress Notes (Addendum)
PULMONARY  / CRITICAL CARE MEDICINE  Name: [name] MRN: [id number] DOB: 11/19/1945    ADMISSION DATE:  03/02/2013 CONSULTATION DATE:  11-26  REFERRING MD :  neuro PRIMARY SERVICE: Neuro  CHIEF COMPLAINT:  Shock  BRIEF PATIENT DESCRIPTION:  67 yo wm with chronic back pain and underwent decompression fusion L1 to S1 11-25. He developed persistent hypotension 11-26 and proved refractory to IVF and required neo drip. We note he is on chronic steroids. PCCM asked to assist in his in care. PMH - CAD, COPD, RA, chronic pain  SIGNIFICANT EVENTS / STUDIES:  11-26 hypotension  LINES / TUBES: PIV  CULTURES: None  ANTIBIOTICS: 11-25 vanco>>  SUBJECTIVE: oob to chair C/o nausea - on liquids No BM, passing gas C/o pain  VITAL SIGNS: Temp:  [97.9 F (36.6 C)-98.9 F (37.2 C)] 97.9 F (36.6 C) (11/28 0400) Pulse Rate:  [81-107] 107 (11/28 0930) Resp:  [15-33] 28 (11/28 0930) BP: (98-150)/(55-97) 132/65 mmHg (11/28 0800) SpO2:  [92 %-100 %] 95 % (11/28 0930) HEMODYNAMICS:   VENTILATOR SETTINGS:   INTAKE / OUTPUT: Intake/Output     11/27 0701 - 11/28 0700 11/28 0701 - 11/29 0700   P.O. 1200 200   I.V. (mL/kg) 450 (6.3)    IV Piggyback 300 200   Total Intake(mL/kg) 1950 (27.2) 400 (5.6)   Urine (mL/kg/hr) 2400 (1.4) 375 (1.4)   Drains     Total Output 2400 375   Net -450 +25        Urine Occurrence 1 x 1 x    PHYSICAL EXAMINATION: General: awake, alert Neuro:  WNL HEENT:  No JVD/LAN Cardiovascular:  HSR RRR Lungs:  CTA Abdomen:  +bs , soft, ND, ND and +BS. Musculoskeletal:  intact Skin:  Post back dressing intact  LABS:  CBC  Recent Labs Lab 03/03/13 1715 03/04/13 0400 03/05/13 0355  WBC 9.4 6.4 7.9  HGB 8.8* 7.1* 8.4*  HCT 25.3* 20.9* 24.9*  PLT 164 144* 214   Coag's No results found for this basename: APTT, INR,  in the last 168 hours BMET  Recent Labs Lab 03/03/13 1715 03/04/13 0400 03/05/13 0355  NA 138 139 138  K 3.5 3.0* 3.6   CL 104 106 104  CO2 26 29 29   BUN 5* 6 7  CREATININE 0.55 0.51 0.53  GLUCOSE 170* 126* 102*   Electrolytes  Recent Labs Lab 03/03/13 1715 03/04/13 0400 03/05/13 0355  CALCIUM 7.6* 7.4* 8.0*  MG  --   --  2.3  PHOS  --   --  1.8*   Sepsis Markers No results found for this basename: LATICACIDVEN, PROCALCITON, O2SATVEN,  in the last 168 hours ABG No results found for this basename: PHART, PCO2ART, PO2ART,  in the last 168 hours Liver Enzymes No results found for this basename: AST, ALT, ALKPHOS, BILITOT, ALBUMIN,  in the last 168 hours Cardiac Enzymes No results found for this basename: TROPONINI, PROBNP,  in the last 168 hours Glucose No results found for this basename: GLUCAP,  in the last 168 hours  Imaging No results found.     ASSESSMENT / PLAN:  PULMONARY A: copd P:   - Resume tudorza/ daliresp on dc - Atrovent /albut nebs while inpt  CARDIOVASCULAR A: Hypotension -resolved, prob hypovolemic shock P:  - Resume antihypertensives. - Hold diuretics given BP. - IVF NS at 75 ml/hr, will continue. - Stress steroids as ordered. - Off neo   RENAL A:  Hypokalemia Hypophos P:   -  Replete and monitor.  GASTROINTESTINAL A:  Nausea / Bloating P:  reglan IV x 3 doses   HEMATOLOGIC A:  Hg low, no evidence of bleeding, likely due to hemodilution for now. P:  - Transfuse for 7 or lower. - CBC in AM.  INFECTIOUS A:  Empiric abx  P:   - See flows  ENDOCRINE A:  Presumed adrenal insufff  , unclear why on prednisone / RA vs DJD P:   -dc  Solucortef -resume PO prednisone   NEUROLOGIC A:  Post multilevel lumbar sugery P:   - Per Neurosurgery. - Limit narcotics as able given low BP.  TODAY'S SUMMARY:  67 yo wm with chronic back pain and underwent decompression fusion L1 to S1 11-25. He developed persistent hypotension and required neo drip. dc stress dose steroids. OK to transfer to floor. Watch GI symptoms on pain meds PCCM available for questions  as needed    [name] MD. FCCP. [facility] Pager [phone] If no response call [phone]

## 2013-03-05 NOTE — Progress Notes (Signed)
Patient ID: [name], male   DOB: 06/18/1945, 67 y.o.   MRN: [id number] Afeb, vss No new neuro issues Cleared for transfer by CCM Will transfer to floor and increase activity.

## 2013-03-05 NOTE — Progress Notes (Signed)
Physical Therapy Treatment Patient Details Name: [name] MRN: [id number] DOB: Jul 05, 1945 Today's Date: 03/05/2013 Time: 0850-0930 PT Time Calculation (min): 40 min  PT Assessment / Plan / Recommendation  History of Present Illness Patient is a 67 yo male with chronic back pain and underwent decompression fusion L1 to S1 11/25. He developed persistent hypotension 11/26 and proved refractory to IVF and required neo drip. We note he is on chronic steroids. PCCM asked to assist in his in care. PMH includes CAD, COPD, RA, chronic pain.   PT Comments   Pt limited due to nausea and RN notified and meds given. Pt also limited due to pain but able to ambulate.  Updated d/c plans to SNF due to insurance denied CIR.  Pt reports abdominal discomfort and noticeable increase abdominal distention therefore highly recommend continued to mobility.  RN aware. Will continue to follow.    Follow Up Recommendations  SNF (insurance denied CIR)     Does the patient have the potential to tolerate intense rehabilitation     Barriers to Discharge        Equipment Recommendations  None recommended by PT    Recommendations for Other Services    Frequency Min 5X/week   Progress towards PT Goals Progress towards PT goals: Progressing toward goals  Plan Discharge plan needs to be updated    Precautions / Restrictions Precautions Precautions: Back Precaution Booklet Issued: Yes (comment) Precaution Comments: sx back precautions; pt able to recall 3/3 back precautions; However attempted to twist in sitting Restrictions Weight Bearing Restrictions: No   Pertinent Vitals/Pain 7/10 back pain; notified RN and requested pain meds    Mobility  Bed Mobility Bed Mobility: Rolling Left;Left Sidelying to Sit (patient seated in chair on arrival) Rolling Left: 4: Min guard;With rail Right Sidelying to Sit: 3: Mod assist Details for Bed Mobility Assistance: (A) to elevate trunk OOB and (A) hips to EOB; Pt  reports nausea in sitting Transfers Transfers: Stand to Sit;Sit to Stand Sit to Stand: With upper extremity assist;4: Min assist;From bed Stand to Sit: To chair/3-in-1;4: Min assist;With upper extremity assist Details for Transfer Assistance: vc for back precautions and hand placement Ambulation/Gait Ambulation/Gait Assistance: 4: Min assist Ambulation Distance (Feet): 160 Feet Assistive device: Rolling walker Ambulation/Gait Assistance Details: (A) to manage RW with max cues for step sequence; pt limited to nausea Gait Pattern: Step-through pattern;Wide base of support Gait velocity: v/c's to decrease pace for safey Stairs: No    Exercises     PT Diagnosis:    PT Problem List:   PT Treatment Interventions:     PT Goals (current goals can now be found in the care plan section) Acute Rehab PT Goals Patient Stated Goal: to be independent PT Goal Formulation: With patient Time For Goal Achievement: 03/12/13 Potential to Achieve Goals: Good  Visit Information  Last PT Received On: 03/05/13 Assistance Needed: +1 History of Present Illness: Patient is a 67 yo male with chronic back pain and underwent decompression fusion L1 to S1 11/25. He developed persistent hypotension 11/26 and proved refractory to IVF and required neo drip. We note he is on chronic steroids. PCCM asked to assist in his in care. PMH includes CAD, COPD, RA, chronic pain.    Subjective Data  Subjective: "I'm feeling nauseated." Patient Stated Goal: to be independent   Cognition  Cognition Arousal/Alertness: Awake/alert Behavior During Therapy: WFL for tasks assessed/performed Overall Cognitive Status: Within Functional Limits for tasks assessed    Balance  Balance  Balance Assessed:  (Min A)  End of Session PT - End of Session Equipment Utilized During Treatment: Gait belt Activity Tolerance:  (Limited by nausea) Patient left: in chair;with call bell/phone within reach Nurse Communication: Mobility status    GP     Mckaylah Bettendorf 03/05/2013, 9:41 AM   Antoine Poche, [place] DPT [phone]

## 2013-03-06 MED ORDER — HYDROMORPHONE HCL 2 MG PO TABS
1.0000 mg | ORAL_TABLET | ORAL | Status: DC | PRN
  Administered 2013-03-06 – 2013-03-10 (×21): 2 mg via ORAL
  Filled 2013-03-06 (×22): qty 1

## 2013-03-06 MED ORDER — OXYCODONE HCL ER 10 MG PO T12A
10.0000 mg | EXTENDED_RELEASE_TABLET | Freq: Two times a day (BID) | ORAL | Status: DC
  Administered 2013-03-06 – 2013-03-10 (×9): 10 mg via ORAL
  Filled 2013-03-06 (×9): qty 1

## 2013-03-06 NOTE — Progress Notes (Signed)
Subjective: Patient reports Patient complains of pain medication doesn't work very well. Cannot take Percocet.  Objective: Vital signs in last 24 hours: Temp:  [97.8 F (36.6 C)-99.9 F (37.7 C)] 98.1 F (36.7 C) (11/29 1355) Pulse Rate:  [87-100] 92 (11/29 1355) Resp:  [18] 18 (11/29 1355) BP: (129-170)/(66-82) 150/78 mmHg (11/29 1355) SpO2:  [92 %-100 %] 98 % (11/29 1355)  Intake/Output from previous day: 11/28 0701 - 11/29 0700 In: 1300 [P.O.:1100; IV Piggyback:200] Out: 2375 [Urine:2375] Intake/Output this shift: Total I/O In: 600 [P.O.:600] Out: 1200 [Urine:1200]  Incision is clean and dry motor function appears intact.  Lab Results:  Recent Labs  03/04/13 0400 03/05/13 0355  WBC 6.4 7.9  HGB 7.1* 8.4*  HCT 20.9* 24.9*  PLT 144* 214   BMET  Recent Labs  03/04/13 0400 03/05/13 0355  NA 139 138  K 3.0* 3.6  CL 106 104  CO2 29 29  GLUCOSE 126* 102*  BUN 6 7  CREATININE 0.51 0.53  CALCIUM 7.4* 8.0*    Studies/Results: No results found.  Assessment/Plan: After discussion with patient he would do well with oral Dilaudid. Rate some oral Dilaudid for him. He sees Percocet  LOS: 4 days  mobilize as tolerated.   Britainy Kozub J 03/06/2013, 3:23 PM

## 2013-03-06 NOTE — Progress Notes (Signed)
Occupational Therapy Treatment Patient Details Name: [name] MRN: [id number] DOB: 1945-09-21 Today's Date: 03/06/2013 Time: 1050-1110 OT Time Calculation (min): 20 min  OT Assessment / Plan / Recommendation  History of present illness Patient is a 67 yo male with chronic back pain and underwent decompression fusion L1 to S1 11/25. He developed persistent hypotension 11/26 and proved refractory to IVF and required neo drip. We note he is on chronic steroids. PCCM asked to assist in his in care. PMH includes CAD, COPD, RA, chronic pain.   OT comments  Pt progressing toward goals.  Updated d/c plan to SNF (admission denied by CIR).  Feel that pt would benefit from short rehab stay until he reaches mod I level.  Pt does not have 24/7 supervision/assist at home. Will continue to follow acutely.  Follow Up Recommendations  SNF;Supervision/Assistance - 24 hour    Barriers to Discharge       Equipment Recommendations  3 in 1 bedside comode    Recommendations for Other Services    Frequency Min 3X/week   Progress towards OT Goals Progress towards OT goals: Progressing toward goals  Plan Discharge plan needs to be updated    Precautions / Restrictions Precautions Precautions: Back Precaution Comments: Able to verbalize 3/3 back precautions independently. Requires min cues throughout session to maintain precautions.   Pertinent Vitals/Pain See vitals   ADL  Grooming: Performed;Supervision/safety Where Assessed - Grooming: Supported standing Toilet Transfer: Performed;Min Psychiatric nurse Method: Sit to Loss adjuster, chartered: Comfort height toilet Toileting - Clothing Manipulation and Hygiene: Performed;Min guard Where Assessed - Best boy and Hygiene: Sit to stand from 3-in-1 or toilet Transfers/Ambulation Related to ADLs: Min guard with RW ADL Comments: Cues to avoid twistign during grooming tasks at sink.  Pt is very painful in sitting  but reports decreased pain when up ambulating.    OT Diagnosis:    OT Problem List:   OT Treatment Interventions:     OT Goals(current goals can now be found in the care plan section) Acute Rehab OT Goals Patient Stated Goal: to be independent OT Goal Formulation: With patient Time For Goal Achievement: 03/17/13 Potential to Achieve Goals: Good ADL Goals Pt Will Perform Lower Body Bathing: sit to/from stand;with adaptive equipment;with modified independence Pt Will Perform Lower Body Dressing: with modified independence;with adaptive equipment;sit to/from stand Pt Will Transfer to Toilet: with modified independence;bedside commode Pt Will Perform Toileting - Clothing Manipulation and hygiene: with modified independence;with adaptive equipment;sit to/from stand Pt Will Perform Tub/Shower Transfer: with supervision;ambulating;3 in 1 Additional ADL Goal #1: Pt will demonstrate 3/3 back precautions during ADL task independently  Visit Information  Last OT Received On: 03/06/13 Assistance Needed: +1 History of Present Illness: Patient is a 67 yo male with chronic back pain and underwent decompression fusion L1 to S1 11/25. He developed persistent hypotension 11/26 and proved refractory to IVF and required neo drip. We note he is on chronic steroids. PCCM asked to assist in his in care. PMH includes CAD, COPD, RA, chronic pain.    Subjective Data      Prior Functioning       Cognition  Cognition Arousal/Alertness: Awake/alert Behavior During Therapy: WFL for tasks assessed/performed Overall Cognitive Status: Within Functional Limits for tasks assessed    Mobility   Transfers Transfers: Sit to Stand;Stand to Sit Sit to Stand: 4: Min guard;From chair/3-in-1;From toilet Stand to Sit: 4: Min guard;To chair/3-in-1;To toilet Details for Transfer Assistance: Cueing for hand placement and to  maintain back precautions. Incr time due to pain.    Exercises      Balance     End of  Session OT - End of Session Equipment Utilized During Treatment: Rolling walker Activity Tolerance: Patient limited by pain Patient left: in chair;with call bell/phone within reach Nurse Communication: Mobility status  GO    03/06/2013 Darrol Jump OTR/L Pager [phone] Office [phone]  Darrol Jump 03/06/2013, 11:54 AM

## 2013-03-06 NOTE — Progress Notes (Signed)
Physical Therapy Treatment Patient Details Name: PACE LAMADRID MRN: [id number] DOB: 1946/01/23 Today's Date: 03/06/2013 Time: 0828-0900 PT Time Calculation (min): 32 min  PT Assessment / Plan / Recommendation  History of Present Illness Patient is a 67 yo male with chronic back pain and underwent decompression fusion L1 to S1 11/25. He developed persistent hypotension 11/26 and proved refractory to IVF and required neo drip. We note he is on chronic steroids. PCCM asked to assist in his in care. PMH includes CAD, COPD, RA, chronic pain.   PT Comments   Patient progressing well with mobility. Largest issue appears to be increased pain while lying in the bed or sitting upright. Patient does not have 24 hour assist and would benefit from continued therapy to increase functional independence at home  Follow Up Recommendations  SNF     Does the patient have the potential to tolerate intense rehabilitation     Barriers to Discharge        Equipment Recommendations  None recommended by PT    Recommendations for Other Services    Frequency Min 5X/week   Progress towards PT Goals Progress towards PT goals: Progressing toward goals  Plan Current plan remains appropriate    Precautions / Restrictions Precautions Precautions: Back Precaution Comments: sx back precautions; pt able to recall 3/3 back precautions; However attempted to twist in sitting   Pertinent Vitals/Pain 9/10 back pain. Patient medicated    Mobility  Bed Mobility Right Sidelying to Sit: 4: Min guard Sit to Sidelying Right: 4: Min guard Details for Bed Mobility Assistance: MinGaurd for safety. Patient with correct technique Transfers Sit to Stand: 4: Min guard;From bed;With armrests;From toilet Stand to Sit: To toilet;With upper extremity assist;4: Min guard;To bed Details for Transfer Assistance: vc for back precautions and hand placement. Patient guarded and requires more effort Ambulation/Gait Ambulation/Gait  Assistance: 4: Min guard Ambulation Distance (Feet): 400 Feet Assistive device: Rolling walker Ambulation/Gait Assistance Details: Guarded with ambulation. Good safe technique and use of RW Gait Pattern: Step-through pattern;Decreased stride length    Exercises     PT Diagnosis:    PT Problem List:   PT Treatment Interventions:     PT Goals (current goals can now be found in the care plan section)    Visit Information  Last PT Received On: 03/06/13 Assistance Needed: +1 History of Present Illness: Patient is a 67 yo male with chronic back pain and underwent decompression fusion L1 to S1 11/25. He developed persistent hypotension 11/26 and proved refractory to IVF and required neo drip. We note he is on chronic steroids. PCCM asked to assist in his in care. PMH includes CAD, COPD, RA, chronic pain.    Subjective Data      Cognition  Cognition Arousal/Alertness: Awake/alert Behavior During Therapy: WFL for tasks assessed/performed Overall Cognitive Status: Within Functional Limits for tasks assessed    Balance     End of Session PT - End of Session Equipment Utilized During Treatment: Gait belt Patient left: in chair;with call bell/phone within reach Nurse Communication: Mobility status   GP     Jacqualyn Posey 03/06/2013, 9:22 AM  03/06/2013 Jacqualyn Posey PTA [phone] pager [phone] office

## 2013-03-07 LAB — TYPE AND SCREEN
ABO/RH(D): A NEG
Antibody Screen: NEGATIVE
Unit division: 0
Unit division: 0

## 2013-03-07 MED ORDER — DEXAMETHASONE 4 MG PO TABS
4.0000 mg | ORAL_TABLET | Freq: Three times a day (TID) | ORAL | Status: AC
  Administered 2013-03-07 – 2013-03-08 (×4): 4 mg via ORAL
  Filled 2013-03-07 (×4): qty 1

## 2013-03-07 MED ORDER — HYDROMORPHONE HCL 2 MG PO TABS
2.0000 mg | ORAL_TABLET | Freq: Once | ORAL | Status: AC
  Administered 2013-03-07: 2 mg via ORAL

## 2013-03-07 NOTE — Progress Notes (Signed)
Per note by Karene Fry- RN- CIR- patient does not meet criteria for admission to CIR. Recommends either SNF or home with HH.  [facility] Medicare referral loaded into Provider Link.  Bed search is active for SNF bed in [place] [place], [place]

## 2013-03-07 NOTE — Progress Notes (Signed)
Subjective: Patient reports Complains of acute left groin pain radiates to posterior superior iliac crest.  Objective: Vital signs in last 24 hours: Temp:  [97.6 F (36.4 C)-98.9 F (37.2 C)] 98.7 F (37.1 C) (11/30 0929) Pulse Rate:  [86-105] 100 (11/30 0929) Resp:  [18] 18 (11/30 0929) BP: (120-150)/(69-80) 122/78 mmHg (11/30 0929) SpO2:  [93 %-98 %] 96 % (11/30 0929)  Intake/Output from previous day: 11/29 0701 - 11/30 0700 In: 1160 [P.O.:960; IV Piggyback:200] Out: 1950 [Urine:1950] Intake/Output this shift: Total I/O In: -  Out: 1000 [Urine:1000]  incision is clean dry motor function is good in lower extremity  Lab Results:  Recent Labs  03/05/13 0355  WBC 7.9  HGB 8.4*  HCT 24.9*  PLT 214   BMET  Recent Labs  03/05/13 0355  NA 138  K 3.6  CL 104  CO2 29  GLUCOSE 102*  BUN 7  CREATININE 0.53  CALCIUM 8.0*    Studies/Results: No results found.  Assessment/Plan: Suspect ileal inguinal pain and left groin.  LOS: 5 days  Treat with steroids. Start some Decadron.   Mylene Bow J 03/07/2013, 12:55 PM

## 2013-03-08 MED ORDER — FLEET ENEMA 7-19 GM/118ML RE ENEM: 1.0000 | ENEMA | Freq: Every day | RECTAL | Status: DC | PRN

## 2013-03-08 NOTE — Progress Notes (Signed)
Patient ID: [name], male   DOB: Oct 31, 1945, 67 y.o.   MRN: [id number] Doing great, decrease of incisional pain. No weakness. Waiting for rehabilitation medicine to transfer

## 2013-03-08 NOTE — Clinical Social Work Note (Signed)
CSW met with pt at bedside to present bed offers. Pt stated that he was told by MD that pt was eligible for CIR placement at time of discharge, and did not want SNF placement options. Pt agreed to accept SNF placement offers, but informed CSW that he would really prefer CIR due to convenience and his willingness to work with PT. Pt was complaining of pain in his back while CSW was at bedside. Pt stated that he had called MD but had not informed RN of pain. CSW followed-up with RN regarding pt's pain complaint. CSW informed pt that CSW would follow-up with CIR.  CSW left message with CIR admission coordinator requesting someone stop by pt's room to inform pt of denial and to clear up any miscommunication. CSW to continue to follow and assist with discharge planning needs.  CSW requesting MD to please sign FL2 and 30 day note, located in shadow chart, in order for CSW to complete [facility] number for SNF placement.  [name], [place] Social Worker [phone]

## 2013-03-08 NOTE — Progress Notes (Signed)
Physical Therapy Treatment Patient Details Name: [name] MRN: [id number] DOB: [birth year] Today's Date: 03/08/2013 Time: 1401-1430 PT Time Calculation (min): 29 min  PT Assessment / Plan / Recommendation  History of Present Illness Patient is a 67 yo male with chronic back pain and underwent decompression fusion L1 to S1 11/25. He developed persistent hypotension 11/26 and proved refractory to IVF and required neo drip. We note he is on chronic steroids. PCCM asked to assist in his in care. PMH includes CAD, COPD, RA, chronic pain.   PT Comments   Pt moving well and continues to make good progress.  Pt at S level for all mobility this pm with only occasional cueing for back precautions.  Pt demos good technique while in the bathroom and while brushing teeth.  Will continue to follow.    Follow Up Recommendations  SNF     Does the patient have the potential to tolerate intense rehabilitation     Barriers to Discharge        Equipment Recommendations  None recommended by PT    Recommendations for Other Services    Frequency Min 5X/week   Progress towards PT Goals Progress towards PT goals: Progressing toward goals  Plan Current plan remains appropriate    Precautions / Restrictions Precautions Precautions: Back Precaution Comments: Able to verbalize 3/3 back precautions independently. Requires min cues throughout session to maintain precautions. Required Braces or Orthoses: Spinal Brace Spinal Brace: Lumbar corset;Applied in sitting position Restrictions Weight Bearing Restrictions: No   Pertinent Vitals/Pain 5/10 in back.  Premedicated.      Mobility  Bed Mobility Bed Mobility: Rolling Right;Right Sidelying to Sit;Sitting - Scoot to Edge of Bed Rolling Right: 5: Supervision;With rail Right Sidelying to Sit: 5: Supervision Sitting - Scoot to Edge of Bed: 5: Supervision Details for Bed Mobility Assistance: pt demos good technique.  No A needed.    Transfers Transfers: Stand to Sit;Sit to Stand Sit to Stand: 5: Supervision;With upper extremity assist;From bed Stand to Sit: 5: Supervision;With upper extremity assist;To chair/3-in-1 Details for Transfer Assistance: cues for UE use.   Ambulation/Gait Ambulation/Gait Assistance: 5: Supervision Ambulation Distance (Feet): 700 Feet Assistive device: Rolling walker Ambulation/Gait Assistance Details: pt demos good use of RW and maintains upright posture.  pt indicates feeling good this pm with ambulating.   Gait Pattern: Step-through pattern;Decreased stride length Stairs: No Wheelchair Mobility Wheelchair Mobility: No    Exercises     PT Diagnosis:    PT Problem List:   PT Treatment Interventions:     PT Goals (current goals can now be found in the care plan section) Acute Rehab PT Goals Patient Stated Goal: to go to rehab Time For Goal Achievement: 03/12/13 Potential to Achieve Goals: Good  Visit Information  Last PT Received On: 03/08/13 Assistance Needed: +1 History of Present Illness: Patient is a 67 yo male with chronic back pain and underwent decompression fusion L1 to S1 11/25. He developed persistent hypotension 11/26 and proved refractory to IVF and required neo drip. We note he is on chronic steroids. PCCM asked to assist in his in care. PMH includes CAD, COPD, RA, chronic pain.    Subjective Data  Patient Stated Goal: to go to rehab   Cognition  Cognition Arousal/Alertness: Awake/alert Behavior During Therapy: WFL for tasks assessed/performed Overall Cognitive Status: Within Functional Limits for tasks assessed    Balance  Balance Balance Assessed: No  End of Session PT - End of Session Equipment Utilized During Treatment: Gait  belt;Back brace Activity Tolerance: Patient tolerated treatment well Patient left: in chair;with call bell/phone within reach Nurse Communication: Mobility status   GP     Catarina Hartshorn, [place] 03/08/2013, 2:52  PM

## 2013-03-08 NOTE — Progress Notes (Signed)
Occupational Therapy Treatment Patient Details Name: NNAEMEKA SAMSON MRN: [id number] DOB: 08-24-1945 Today's Date: 03/08/2013 Time: 8299-3716 OT Time Calculation (min): 33 min  OT Assessment / Plan / Recommendation  History of present illness Patient is a 67 yo male with chronic back pain and underwent decompression fusion L1 to S1 11/25. He developed persistent hypotension 11/26 and proved refractory to IVF and required neo drip. We note he is on chronic steroids. PCCM asked to assist in his in care. PMH includes CAD, COPD, RA, chronic pain.   OT comments  Progressing towards goals. Pt moving well during session. Recommending SNF for additional rehab prior to d/c home.   Follow Up Recommendations  SNF;Supervision/Assistance - 24 hour    Barriers to Discharge       Equipment Recommendations  3 in 1 bedside comode    Recommendations for Other Services    Frequency Min 3X/week   Progress towards OT Goals Progress towards OT goals: Progressing toward goals  Plan Discharge plan remains appropriate    Precautions / Restrictions Precautions Precautions: Back Precaution Comments: Able to verbalize 3/3 back precautions independently. Requires min cues throughout session to maintain precautions. Required Braces or Orthoses: Spinal Brace Spinal Brace: Lumbar corset;Applied in sitting position (states he hasn't worn this all the time) Restrictions Weight Bearing Restrictions: No   Pertinent Vitals/Pain Pain 4-5/10. Increased activity during session.     ADL  Grooming: Performed;Supervision/safety;Set up;Wash/dry hands;Teeth care;Brushing hair Where Assessed - Grooming: Supported standing Upper Body Dressing: Minimal assistance (back brace) Where Assessed - Upper Body Dressing: Unsupported sitting Lower Body Dressing: Minimal assistance Where Assessed - Lower Body Dressing: Supported sit to stand Toilet Transfer: Min Psychiatric nurse Method: Sit to Environmental manager: Comfort height toilet;Other (comment) (sit to stand from bed and recliner chair; stood at commode) Toileting - Water quality scientist and Hygiene: Min guard Where Assessed - Best boy and Hygiene: Standing Equipment Used: Back brace;Gait belt;Reacher;Long-handled sponge;Long-handled shoe horn;Rolling walker;Sock aid Transfers/Ambulation Related to ADLs: Min guard  ADL Comments: Educated on use of bag on walker to carry items. Pt able to cross leg over knee, but said it was painful/straining, so educated on AE. Pt practiced with reacher and sockaid-overall Min A level. Spoke about shower transfer. Pt requested to go walk.  Cues for precautions during grooming tasks. Educated on having grooming items on Right side of sink and also using two cups for teeth care.    OT Diagnosis:    OT Problem List:   OT Treatment Interventions:     OT Goals(current goals can now be found in the care plan section) Acute Rehab OT Goals Patient Stated Goal: to go to rehab OT Goal Formulation: With patient Time For Goal Achievement: 03/17/13 Potential to Achieve Goals: Good ADL Goals Pt Will Perform Grooming: with modified independence;standing Pt Will Perform Lower Body Bathing: sit to/from stand;with adaptive equipment;with modified independence Pt Will Perform Lower Body Dressing: with modified independence;with adaptive equipment;sit to/from stand Pt Will Transfer to Toilet: with modified independence;bedside commode Pt Will Perform Toileting - Clothing Manipulation and hygiene: with modified independence;with adaptive equipment;sit to/from stand Pt Will Perform Tub/Shower Transfer: with supervision;ambulating;3 in 1 Additional ADL Goal #1: Pt will demonstrate 3/3 back precautions during ADL task independently  Visit Information  Last OT Received On: 03/08/13 Assistance Needed: +1 History of Present Illness: Patient is a 67 yo male with chronic back pain and underwent  decompression fusion L1 to S1 11/25. He developed persistent hypotension 11/26 and  proved refractory to IVF and required neo drip. We note he is on chronic steroids. PCCM asked to assist in his in care. PMH includes CAD, COPD, RA, chronic pain.    Subjective Data      Prior Functioning       Cognition  Cognition Arousal/Alertness: Awake/alert Behavior During Therapy: WFL for tasks assessed/performed Overall Cognitive Status: Within Functional Limits for tasks assessed    Mobility  Bed Mobility Bed Mobility: Rolling Right;Right Sidelying to Sit;Sitting - Scoot to Edge of Bed Rolling Right: 5: Supervision Right Sidelying to Sit: 5: Supervision Sitting - Scoot to Edge of Bed: 4: Min guard Details for Bed Mobility Assistance: Cues for technique.  Transfers Transfers: Sit to Stand;Stand to Sit Sit to Stand: 4: Min guard;With upper extremity assist;From bed;From chair/3-in-1 Stand to Sit: 4: Min guard;To chair/3-in-1 Details for Transfer Assistance: Cues for technique and precautions.    Exercises      Balance     End of Session OT - End of Session Equipment Utilized During Treatment: Gait belt;Rolling walker;Back brace Activity Tolerance: Patient tolerated treatment well Patient left: in chair;with call bell/phone within reach  GO     Benito Mccreedy OTR/L [phone] 03/08/2013, 1:38 PM

## 2013-03-09 NOTE — Discharge Summary (Signed)
Physician Discharge Summary  Patient ID: BOLIVAR KORANDA MRN: [id number] DOB/AGE: [birth year] 67 y.o.  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 Ambulating. No weakness  Disposition:SNF. Once he goes home he is to be more active walking every day.   Future Appointments Provider Department Dept Phone   07/16/2013 12:00 PM Star Age, MD [facility] [phone]       Medication List    ASK your doctor about these medications       Abdominal Binder/Elastic Med Misc  1 Device by Does not apply route as needed.     Aclidinium Bromide 400 MCG/ACT Aepb  Commonly known as:  TUDORZA PRESSAIR  Inhale 1 puff into the lungs 2 (two) times daily.     ALPRAZolam 0.5 MG tablet  Commonly known as:  XANAX  Take 0.5 mg by mouth 3 (three) times daily as needed for anxiety or sleep.     aspirin EC 81 MG EC tablet  Generic drug:  aspirin  Take 81 mg by mouth every morning.     celecoxib 200 MG capsule  Commonly known as:  CELEBREX  Take 200 mg by mouth 2 (two) times daily.     cholecalciferol 1000 UNITS tablet  Commonly known as:  VITAMIN D  Take 1 tablet (1,000 Units total) by mouth daily.     COLCRYS 0.6 MG tablet  Generic drug:  colchicine  TAKE 1 TABLET (0.6 MG TOTAL) BY MOUTH 2 (TWO) TIMES DAILY.     [employer] MELATONIN 5 MG Tabs  Generic drug:  Melatonin  Take 5 mg by mouth at bedtime.     DALIRESP 500 MCG Tabs tablet  Generic drug:  roflumilast  TAKE 1 TABLET (500 MCG TOTAL) BY MOUTH DAILY.     FLUoxetine 40 MG capsule  Commonly known as:  PROZAC  Take 40 mg by  mouth 2 (two) times daily.     fluticasone 50 MCG/ACT nasal spray  Commonly known as:  FLONASE  Place 2 sprays into both nostrils daily.     furosemide 20 MG tablet  Commonly known as:  LASIX  Take 20 mg by mouth daily.     gabapentin 300 MG capsule  Commonly known as:  NEURONTIN  Take 300 mg by mouth 4 (four) times daily. 600 mg every morning, 300 mg at noon, 600 mg at bedtime, and 300 mg at 1:00 am.     HUMIRA 40 MG/0.8ML injection  Generic drug:  adalimumab  Inject 40 mg into the skin every 14 (fourteen) days.     hydrochlorothiazide 25 MG tablet  Commonly known as:  HYDRODIURIL  Take 25 mg by mouth daily before breakfast.     HYDROmorphone 2 MG tablet  Commonly known as:  DILAUDID  Take 1 tablet (2 mg total) by mouth every 4 (four) hours as needed for pain. May take up to 2 tablets if needed. Fill on or after 03/04/13.     losartan 50 MG tablet  Commonly known as:  COZAAR  TAKE 1 TABLET BY MOUTH ONCE A DAY     losartan 50 MG tablet  Commonly known as:  COZAAR  Take 50 mg by mouth  daily before breakfast.     OxyCODONE 20 mg T12a 12 hr tablet  Commonly known as:  OXYCONTIN  Take 20 mg by mouth every 12 (twelve) hours.     pantoprazole 40 MG tablet  Commonly known as:  PROTONIX  Take 40 mg by mouth 2 (two) times daily.     potassium chloride SA 20 MEQ tablet  Commonly known as:  K-DUR,KLOR-CON  Take 20 mEq by mouth daily.     predniSONE 5 MG tablet  Commonly known as:  DELTASONE  Take 5 mg by mouth daily with breakfast.     simvastatin 40 MG tablet  Commonly known as:  ZOCOR  Take 40 mg by mouth daily. At lunch     SYSTANE ULTRA 0.4-0.3 % Soln  Generic drug:  Polyethyl Glycol-Propyl Glycol  Apply 2 drops to eye 2 (two) times daily as needed (dry eyes).     traZODone 100 MG tablet  Commonly known as:  DESYREL  Take 100 mg by mouth at bedtime. Take just before bedtime     traZODone 50 MG tablet  Commonly known as:  DESYREL  Take 50 mg by mouth at  bedtime. Take when wakes up over in the night         Signed: Heinz Eckert M 03/09/2013, 11:04 AM

## 2013-03-09 NOTE — Progress Notes (Signed)
Physical Therapy Treatment Patient Details Name: [name] MRN: [id number] DOB: [birth year] Today's Date: 03/09/2013 Time: 9311-2162 PT Time Calculation (min): 30 min  PT Assessment / Plan / Recommendation  History of Present Illness Patient is a 67 yo male with chronic back pain and underwent decompression fusion L1 to S1 11/25. He developed persistent hypotension 11/26 and proved refractory to IVF and required neo drip. We note he is on chronic steroids. PCCM asked to assist in his in care. PMH includes CAD, COPD, RA, chronic pain.   PT Comments   Patient demonstrates safe mobility, however wife is unable to physically assist due to her back issues and would likely attempt unsafe activities at home with his active temperament.  Will benefit from SNF level rehab prior to d/c home.  Follow Up Recommendations  SNF     Does the patient have the potential to tolerate intense rehabilitation   N/A  Barriers to Discharge  None      Equipment Recommendations  None recommended by PT    Recommendations for Other Services  None  Frequency Min 5X/week   Progress towards PT Goals Progress towards PT goals: Progressing toward goals  Plan Current plan remains appropriate    Precautions / Restrictions Precautions Precautions: Back Required Braces or Orthoses: Spinal Brace Spinal Brace: Lumbar corset;Applied in sitting position   Pertinent Vitals/Pain 6/10 left side back with sitting    Mobility  Bed Mobility Rolling Left: 5: Supervision;With rail Left Sidelying to Sit: 5: Supervision Transfers Sit to Stand: 4: Min guard;5: Supervision;From bed;With upper extremity assist Stand to Sit: 5: Supervision;To bed;To chair/3-in-1;With upper extremity assist Details for Transfer Assistance: performed 5 reps for LE strengthening; min cues for hand placement Ambulation/Gait Ambulation/Gait Assistance: 5: Supervision Ambulation Distance (Feet): 400 Feet Assistive device: Rolling  walker Gait Pattern: Step-through pattern;Decreased stride length Stairs: Yes Stairs Assistance: 4: Min guard Stair Management Technique: One rail Right;Step to pattern;Sideways Number of Stairs: 6    Exercises Other Exercises Other Exercises: Education on walking as main exercise and need to avoid strenuous exercise due to risk of hardware failure.  Discussed best to do SNF rehab to keep him from overdoing it at intense rehab or even at home.       PT Goals (current goals can now be found in the care plan section)    Visit Information  Last PT Received On: 03/09/13 Assistance Needed: +1 History of Present Illness: Patient is a 66 yo male with chronic back pain and underwent decompression fusion L1 to S1 11/25. He developed persistent hypotension 11/26 and proved refractory to IVF and required neo drip. We note he is on chronic steroids. PCCM asked to assist in his in care. PMH includes CAD, COPD, RA, chronic pain.    Subjective Data   Only bothers me when I sit   Cognition  Cognition Arousal/Alertness: Awake/alert Behavior During Therapy: WFL for tasks assessed/performed Overall Cognitive Status: Within Functional Limits for tasks assessed    Balance     End of Session PT - End of Session Equipment Utilized During Treatment: Back brace Activity Tolerance: Patient tolerated treatment well Patient left: in chair;with call bell/phone within reach   GP     [facility] 03/09/2013, 10:09 AM Magda Kiel, [place] 03/09/2013

## 2013-03-09 NOTE — Progress Notes (Signed)
[place] PHYSICAL MEDICINE AND REHABILITATION  CONSULT SERVICE NOTE   I was asked by Dr. Linda Hedges to re-evaluate [name] regarding need for inpatient rehab.   At this point he was walking several hundred feet at a supervision level and is supervision to minimal assistance for basic self-care. We would be very hard pressed to justify necessity for inpatient rehab considering that he's already at these levels and the fact that he would still likely need supervision after an inpatient rehab admit. If he and his spouse are uncomfortable with going home, then I think SNF admission would be appropriately as SNF criteria for medical and functional necessity are much less than those we are required to meet in bringing a patient to CIR.   Thanks  Meredith Staggers, MD, [place]

## 2013-03-09 NOTE — Clinical Social Work Note (Signed)
CSW spoke to pt's wife, [name] ([phone]), regarding SNF placement for pt. Debbie stated that she would like for pt to be placed at [facility] once medically stable for discharge. CSW confirmed availability with admissions coordinator at [place]. CSW sent updated information of SNF placement choice to [facility] for authorization. [facility] admissions coordinator, [name], and RN aware that discharge will be for tomorrow. Debbie expressed gratitude for CSW and [facility] work done in supporting and treating pt. [name] stated that she will be transporting pt by car to SNF. CSW to call [name] once insurance authorization has been approved for her to arrive at [facility] for pt's discharge.  CSW to continue to follow and assist with discharge planning needs.  Pati Gallo, [place] Social Worker [phone]

## 2013-03-10 ENCOUNTER — Encounter: Payer: Self-pay | Admitting: Internal Medicine

## 2013-03-10 NOTE — Progress Notes (Signed)
MRN: [id number] Name: [name]  Sex: male Age: 67 y.o. DOB: 06/13/1945  [place] #:  Facility/Room: Level Of Care: SNF Provider: Inocencio Homes D Emergency Contacts: Extended Emergency Contact Information Primary Emergency Contact: Croft,Debbie Address: [place], [place] [address] [place] Phone: [phone] Mobile Phone: [phone] Relation: Significant other  Code Status:   Allergies: Morphine and related; Penicillins; and Percocet  Chief Complaint  Patient presents with  . nursing home admission    HPI: Patient is 67 y.o. male who  Past Medical History  Diagnosis Date  . Hx of colonic polyps   . Diverticulosis   . Hypertension   . Osteoarthritis   . 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 CAD (coronary artery disease)     minimal coronary plaque in the LAD and right coronary system. 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 7-06. Last nuclear stress 11-17-06 fixed anterior/ inferior defect, no inducible ischemia, EF 81%  . Allergic rhinitis   . Hx of colonoscopy   . RA (rheumatoid arthritis)   . COPD (chronic obstructive pulmonary disease)   . PONV (postoperative nausea and vomiting)   . Dysrhythmia 01-24-12    past hx. A.Fib x1 episode-responded to med.  . Depression   . Anxiety     pt. feels its related to the medicine that he takes   . GERD (gastroesophageal reflux disease)     Past Surgical History  Procedure Laterality Date  . Appendectomy    . Inguinal hernia repair  2008    Right, left remotely  . Total knee arthroplasty      right knee partial 2002  . Tonsillectomy    . Shoulder preplacement  2008    left, partial  . Ptca w/ coated stent lad      Cx secondary to tear  . Foot surgery  11-08    for removal of bone spurs- right foot  . Reconstruction of ankle  6-09    Right- [facility]  . L4-5  diskectomy w/ fusion, cage placement and rods  12-10    [place]  . Partial knee arthroplasty      left  . Total shoulder replacement      right  . Joint replacement    . Cataract extraction, bilateral  01-24-12    Bilateral  . Orif shoulder fracture  02/06/2012    Procedure: OPEN REDUCTION INTERNAL FIXATION (ORIF) SHOULDER FRACTURE;  Surgeon: Nita Sells, MD;  Location: [facility];  Service: Orthopedics;  Laterality: Left;  ORIF of a Left Shoulder Fracture with  Iliac Crest Bone Graft aspiration   . Harvest bone graft  02/06/2012    Procedure: HARVEST ILIAC BONE GRAFT;  Surgeon: Nita Sells, MD;  Location: [facility];  Service: Orthopedics;;  bone marrow aspirqation   . Hardware removal  03/09/2012    Procedure: HARDWARE REMOVAL;  Surgeon: Nita Sells, MD;  Location: [place];  Service: Orthopedics;  Laterality: Left;  Hardware Removal from Left Shoulder  . Cardiac catheterization  2006    coronary stents- no problems  . Shoulder arthroscopy  2013  . Carpal tunnel release      R hand  . Ankle surgery Right 2008    at [facility], replacement   . Dg toes*r*  toe surgery- 07/2012  . Posterior lumbar fusion 4 level N/A 03/02/2013    Procedure: Lumbar One to Sacral One Posterior lumbar interbody fusion;  Surgeon: Floyce Stakes, MD;  Location: [place] [facility];  Service: Neurosurgery;  Laterality: N/A;  L1 to S1 Posterior lumbar interbody fusion      Medication List    Notice   This visit is during an admission. Changes to the med list made in this visit will be reflected in the After Visit Summary of the admission.    Med list was supposed to come over on this template and didn't  No orders of the defined types were placed in this encounter.   Current Facility-Administered Medications on File Prior to Visit  Medication Dose Route Frequency Provider Last Rate Last Dose  . 0.9 %  sodium chloride infusion  250 mL Intravenous Continuous Floyce Stakes, MD      . 0.9 %  sodium chloride infusion   Intravenous Continuous Floyce Stakes, MD      . acetaminophen (TYLENOL) tablet 650 mg  650 mg Oral Q4H PRN Floyce Stakes, MD       Or  . acetaminophen (TYLENOL) suppository 650 mg  650 mg Rectal Q4H PRN Floyce Stakes, MD      . albuterol (PROVENTIL) (5 MG/ML) 0.5% nebulizer solution 2.5 mg  2.5 mg Nebulization Q3H PRN Rigoberto Noel, MD      . ALPRAZolam Duanne Moron) tablet 0.5 mg  0.5 mg Oral TID PRN Floyce Stakes, MD   0.5 mg at 03/07/13 2100  . aspirin EC tablet 81 mg  81 mg Oral Daily Floyce Stakes, MD   81 mg at 03/10/13 1937  . bupivacaine liposome (EXPAREL) 1.3 % injection 266 mg  20 mL Infiltration Once Floyce Stakes, MD      . colchicine tablet 0.6 mg  0.6 mg Oral BID Floyce Stakes, MD   0.6 mg at 03/10/13 0953  . FLUoxetine (PROZAC) capsule 40 mg  40 mg Oral BID Floyce Stakes, MD   40 mg at 03/10/13 0953  . gabapentin (NEURONTIN) capsule 600 mg  600 mg Oral Custom Floyce Stakes, MD   600 mg at 03/10/13 0756   And  . gabapentin (NEURONTIN) capsule 300 mg  300 mg Oral Custom Floyce Stakes, MD   300 mg at 03/10/13 1226  . hydrochlorothiazide (HYDRODIURIL) tablet 25 mg  25 mg Oral QAC breakfast Rigoberto Noel, MD   25 mg at 03/10/13 0756  . HYDROmorphone (DILAUDID) tablet 1-2 mg  1-2 mg Oral Q3H PRN Kristeen Miss, MD   2 mg at 03/10/13 0536  . loratadine (CLARITIN) tablet 10 mg  10 mg Oral Daily Hennie Duos, MD      . losartan (COZAAR) tablet 50 mg  50 mg Oral Daily Rigoberto Noel, MD   50 mg at 03/09/13 1006  . menthol-cetylpyridinium (CEPACOL) lozenge 3 mg  1 lozenge Oral PRN Floyce Stakes, MD       Or  . phenol (CHLORASEPTIC) mouth spray 1 spray  1 spray Mouth/Throat PRN Floyce Stakes, MD      . methocarbamol (ROBAXIN) tablet 750 mg  750 mg Oral Q6H PRN Faythe Ghee, MD   750 mg at 03/10/13 1225  . ondansetron (ZOFRAN) injection 4 mg  4 mg Intravenous Q4H PRN Floyce Stakes, MD   4 mg at 03/09/13  0959  . OxyCODONE (OXYCONTIN) 12  hr tablet 10 mg  10 mg Oral Q12H Kristeen Miss, MD   10 mg at 03/10/13 0954  . pantoprazole (PROTONIX) EC tablet 40 mg  40 mg Oral BID Floyce Stakes, MD   40 mg at 03/10/13 0955  . polyethylene glycol (MIRALAX / GLYCOLAX) packet 17 g  17 g Oral Daily PRN Floyce Stakes, MD   17 g at 03/10/13 0756  . predniSONE (DELTASONE) tablet 5 mg  5 mg Oral Q breakfast Rigoberto Noel, MD   5 mg at 03/10/13 0757  . roflumilast (DALIRESP) tablet 500 mcg  500 mcg Oral Daily Floyce Stakes, MD   500 mcg at 03/10/13 0954  . simethicone (MYLICON) chewable tablet 80 mg  80 mg Oral Q6H PRN Consuella Lose, MD   80 mg at 03/03/13 1617  . simvastatin (ZOCOR) tablet 40 mg  40 mg Oral Q24H Floyce Stakes, MD   40 mg at 03/10/13 1227  . sodium chloride 0.9 % injection 3 mL  3 mL Intravenous Q12H Floyce Stakes, MD   3 mL at 03/09/13 1005  . sodium chloride 0.9 % injection 3 mL  3 mL Intravenous PRN Floyce Stakes, MD   3 mL at 03/07/13 1000  . sodium phosphate (FLEET) 7-19 GM/118ML enema 1 enema  1 enema Rectal Daily PRN Floyce Stakes, MD   1 enema at 03/10/13 0954  . tiotropium (SPIRIVA) inhalation capsule 18 mcg  18 mcg Inhalation QHS Floyce Stakes, MD   18 mcg at 03/09/13 2309  . traZODone (DESYREL) tablet 100 mg  100 mg Oral QHS Floyce Stakes, MD   100 mg at 03/09/13 2228  . traZODone (DESYREL) tablet 50 mg  50 mg Oral QHS PRN Floyce Stakes, MD   50 mg at 03/05/13 0045  . zolpidem (AMBIEN) tablet 5 mg  5 mg Oral QHS PRN Floyce Stakes, MD   5 mg at 03/08/13 2221   Current Outpatient Prescriptions on File Prior to Visit  Medication Sig Dispense Refill  . Aclidinium Bromide (TUDORZA PRESSAIR) 400 MCG/ACT AEPB Inhale 1 puff into the lungs 2 (two) times daily.  1 each  11  . adalimumab (HUMIRA) 40 MG/0.8ML injection Inject 40 mg into the skin every 14 (fourteen) days.      . ALPRAZolam (XANAX) 0.5 MG tablet Take 0.5 mg by mouth 3 (three) times daily as needed for  anxiety or sleep.      [name] aspirin (ASPIRIN EC) 81 MG EC tablet Take 81 mg by mouth every morning.       . cholecalciferol (VITAMIN D) 1000 UNITS tablet Take 1 tablet (1,000 Units total) by mouth daily.  100 tablet  3  . COLCRYS 0.6 MG tablet TAKE 1 TABLET (0.6 MG TOTAL) BY MOUTH 2 (TWO) TIMES DAILY.  60 tablet  5  . DALIRESP 500 MCG TABS tablet TAKE 1 TABLET (500 MCG TOTAL) BY MOUTH DAILY.  30 tablet  5  . Elastic Bandages & Supports (ABDOMINAL BINDER/ELASTIC MED) MISC 1 Device by Does not apply route as needed.  1 each  0  . FLUoxetine (PROZAC) 40 MG capsule Take 40 mg by mouth 2 (two) times daily.       . fluticasone (FLONASE) 50 MCG/ACT nasal spray Place 2 sprays into both nostrils daily.      . furosemide (LASIX) 20 MG tablet Take 20 mg by mouth daily.      [name] gabapentin (NEURONTIN) 300 MG capsule Take 300  mg by mouth 4 (four) times daily. 600 mg every morning, 300 mg at noon, 600 mg at bedtime, and 300 mg at 1:00 am.      . hydrochlorothiazide (HYDRODIURIL) 25 MG tablet Take 25 mg by mouth daily before breakfast.       . HYDROmorphone (DILAUDID) 2 MG tablet Take 1 tablet (2 mg total) by mouth every 4 (four) hours as needed for pain. May take up to 2 tablets if needed. Fill on or after 03/04/13.  60 tablet  0  . losartan (COZAAR) 50 MG tablet TAKE 1 TABLET BY MOUTH ONCE A DAY  90 tablet  3  . Melatonin ([employer] MELATONIN) 5 MG TABS Take 5 mg by mouth at bedtime.       . OxyCODONE (OXYCONTIN) 20 mg T12A 12 hr tablet Take 20 mg by mouth every 12 (twelve) hours.      . pantoprazole (PROTONIX) 40 MG tablet Take 40 mg by mouth 2 (two) times daily.       Vladimir Faster Glycol-Propyl Glycol (SYSTANE ULTRA) 0.4-0.3 % SOLN Apply 2 drops to eye 2 (two) times daily as needed (dry eyes).       . potassium chloride SA (K-DUR,KLOR-CON) 20 MEQ tablet Take 20 mEq by mouth daily.      . predniSONE (DELTASONE) 5 MG tablet Take 5 mg by mouth daily with breakfast.       . simvastatin (ZOCOR) 40 MG tablet Take 40 mg by  mouth daily. At lunch      . traZODone (DESYREL) 100 MG tablet Take 100 mg by mouth at bedtime. Take just before bedtime        Immunization History  Administered Date(s) Administered  . Influenza Whole 01/01/2008, 01/10/2009, 01/10/2010, 03/16/2012  . Influenza,inj,Quad PF,36+ Mos 01/05/2013  . Pneumococcal Polysaccharide-23 01/01/2008  . Tdap 07/23/2011  . Zoster 04/06/2012    History  Substance Use Topics  . Smoking status: Never Smoker   . Smokeless tobacco: Never Used  . Alcohol Use: No    Family history is noncontributory    Review of Systems  DATA OBTAINED: from patient, nurse, medical record, family member GENERAL: Feels well no fevers, fatigue, appetite changes SKIN: No itching, rash or wounds EYES: No eye pain, redness, discharge EARS: No earache, tinnitus, change in hearing NOSE: No congestion, drainage or bleeding  MOUTH/THROAT: No mouth or tooth pain, No sore throat, No difficulty chewing or swallowing  RESPIRATORY: No cough, wheezing, SOB CARDIAC: No chest pain, palpitations, lower extremity edema  GI: No abdominal pain, No N/V/D or constipation, No heartburn or reflux  GU: No dysuria, frequency or urgency, or incontinence  MUSCULOSKELETAL: No unrelieved bone/joint pain NEUROLOGIC: No headache, dizziness or focal weakness PSYCHIATRIC: No overt anxiety or sadness. Sleeps well. No behavior issue.   There were no vitals filed for this visit.  Physical Exam  GENERAL APPEARANCE: Alert, conversant. Appropriately groomed. No acute distress.  SKIN: No diaphoresis rash, or wounds HEAD: Normocephalic, atraumatic  EYES: Conjunctiva/lids clear. Pupils round, reactive. EOMs intact.  EARS: External exam WNL, canals clear. Hearing grossly normal.  NOSE: No deformity or discharge.  MOUTH/THROAT: Lips w/o lesions. Mouth and throat normal. Tongue moist, w/o lesion.  NECK: No thyroid tenderness, enlargement or nodule  RESPIRATORY: Breathing is even, unlabored. Lung  sounds are clear   CARDIOVASCULAR: Heart RRR no murmurs, rubs or gallops. No peripheral edema.  ARTERIAL: radial pulse 2+, DP pulse 1+  VENOUS: No varicosities. No venous stasis skin changes  GASTROINTESTINAL: Abdomen is  soft, non-tender, not distended w/ normal bowel sounds. No mass, ventral or inguinal hernia. No organomegally GENITOURINARY: Bladder non tender, not distended  MUSCULOSKELETAL: No abnormal joints or musculature NEUROLOGIC: Oriented X3. Cranial nerves 2-12 grossly intact. Moves all extremities no tremor. PSYCHIATRIC: Mood and affect appropriate to situation, no behavioral issues  Patient Active Problem List   Diagnosis Date Noted  . Lumbar degenerative disc disease 03/02/2013  . Lumbar herniated disc 03/02/2013  . Laxity of abdominal wall-right side 09/03/2012  . Vertigo 06/24/2012  . Acute confusional state 06/24/2012  . Ataxia 06/22/2012  . Anemia 06/22/2012  . PVC (premature ventricular contraction) 06/22/2012  . Tremor 06/22/2012  . Chronic pain 04/08/2012  . Routine health maintenance 04/08/2012  . Fracture of acromion of scapula 02/07/2012  . Painless hematuria 08/09/2011  . Other abnormal glucose 08/07/2011  . Obstructive chronic bronchitis with exacerbation 07/23/2011  . 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 Sinusitis, chronic 11/08/2010  . Cough 10/25/2010  . Atrial fibrillation 08/07/2010  . VENOUS INSUFFICIENCY, LEGS 04/10/2009  . BACK PAIN, LUMBAR, WITH RADICULOPATHY 04/06/2009  . DEGENERATIVE DISC DISEASE, LUMBOSACRAL SPINE 10/26/2008  . Rheumatoid arthritis 10/18/2008  . ANXIETY 11/09/2007  . PAIN IN SOFT TISSUES OF LIMB 04/17/2007  . DYSLIPIDEMIA 03/30/2007  . PSEUDOGOUT 03/30/2007  . HYPERTENSION 03/30/2007  . CAD 03/30/2007  . ALLERGIC RHINITIS 03/30/2007  . GENERALIZED OSTEOARTHROSIS UNSPECIFIED SITE 03/30/2007  . HEMOCHROMATOSIS, HX OF 03/30/2007  . COLONIC POLYPS, HX OF 03/30/2007  . DIVERTICULITIS, HX OF 03/30/2007    CBC    Component  Value Date/Time   WBC 7.9 03/05/2013 0355   RBC 2.67* 03/05/2013 0355   HGB 8.4* 03/05/2013 0355   HCT 24.9* 03/05/2013 0355   PLT 214 03/05/2013 0355   MCV 93.3 03/05/2013 0355   LYMPHSABS 1.3 06/22/2012 1455   MONOABS 0.7 06/22/2012 1455   EOSABS 0.0 06/22/2012 1455   BASOSABS 0.0 06/22/2012 1455    CMP     Component Value Date/Time   NA 138 03/05/2013 0355   K 3.6 03/05/2013 0355   CL 104 03/05/2013 0355   CO2 29 03/05/2013 0355   GLUCOSE 102* 03/05/2013 0355   BUN 7 03/05/2013 0355   CREATININE 0.53 03/05/2013 0355   CALCIUM 8.0* 03/05/2013 0355   PROT 6.8 07/14/2012 1618   ALBUMIN 3.7 07/14/2012 1618   AST 21 07/14/2012 1618   ALT 25 07/14/2012 1618   ALKPHOS 69 07/14/2012 1618   BILITOT 0.7 07/14/2012 1618   GFRNONAA >90 03/05/2013 0355   GFRAA >90 03/05/2013 0355    Assessment and Plan  No problem-specific assessment & plan notes found for this encounter.   Hennie Duos, MD    This encounter was created in error - please disregard.

## 2013-03-10 NOTE — Clinical Social Work Note (Signed)
CSW received notice from [facility] and [facility] that pt has been approved for SNF placement. CSW spoke with admissions coordinator for [facility] who stated that pt is able to come to SNF today 03/10/2013. CSW to complete discharge packet and leave on pt's shadow chart. Per RN, pt's wife will be arriving at [facility] around 3pm to drive pt to [facility].  [place]: [place], [place] Social Worker [phone]

## 2013-03-10 NOTE — Progress Notes (Signed)
Patient ID: [name], male   DOB: 1946/02/18, 67 y.o.   MRN: [id number] Was ready to go to a SNF but no insurance approval

## 2013-03-10 NOTE — Progress Notes (Signed)
Physical Therapy Treatment Patient Details Name: [name] MRN: [id number] DOB: 1945/09/02 Today's Date: 03/10/2013 Time: 0911-0920 PT Time Calculation (min): 9 min    03/10/13 0900  PT Visit Information  Last PT Received On 03/10/13  Assistance Needed +1  History of Present Illness Patient is a 67 yo male with chronic back pain and underwent decompression fusion L1 to S1 11/25. He developed persistent hypotension 11/26 and proved refractory to IVF and required neo drip. We note he is on chronic steroids. PCCM asked to assist in his in care. PMH includes CAD, COPD, RA, chronic pain.  PT Time Calculation  PT Start Time 0911  PT Stop Time 0920  PT Time Calculation (min) 9 min  Subjective Data  Subjective I would like to try to go to the bathroom and get back in bed if thats alright  Patient Stated Goal to go to rehab  Precautions  Precautions Back  Required Braces or Orthoses Spinal Brace  Spinal Brace Lumbar corset;Applied in sitting position  Cognition  Arousal/Alertness Awake/alert  Behavior During Therapy WFL for tasks assessed/performed  Overall Cognitive Status Within Functional Limits for tasks assessed  Bed Mobility  Bed Mobility Rolling Left;Sit to Sidelying Right;Scooting to [facility]  Rolling Left 5: Supervision  Sit to Sidelying Right 5: Supervision  Scooting to [facility] 5: Supervision  Details for Bed Mobility Assistance VCs for compliance and sequencing  Transfers  Transfers Stand to Sit;Sit to Stand  Sit to Stand 5: Supervision;With upper extremity assist;From bed  Stand to Sit 5: Supervision;With upper extremity assist;To chair/3-in-1  Details for Transfer Assistance cues for UE use.    Ambulation/Gait  Ambulation/Gait Assistance 5: Supervision  Ambulation Distance (Feet) 26 Feet  Assistive device Rolling walker  Gait Pattern Step-through pattern;Decreased stride length  Dynamic Standing Balance  Dynamic Standing - Balance Support Bilateral upper extremity  supported  Dynamic Standing - Level of Assistance 6: Modified independent (Device/Increase time)  PT - End of Session  Equipment Utilized During Treatment Back brace  Activity Tolerance Patient tolerated treatment well  Patient left in bed;with call bell/phone within reach  Nurse Communication Mobility status  PT - Assessment/Plan  PT Plan Current plan remains appropriate  PT Frequency Min 5X/week  Follow Up Recommendations SNF  PT equipment None recommended by PT  PT Goal Progression  Progress towards PT goals Progressing toward goals  Acute Rehab PT Goals  PT Goal Formulation With patient  Time For Goal Achievement 03/12/13  Potential to Achieve Goals Good  PT General Charges  $$ ACUTE PT VISIT 1 Procedure  PT Treatments  $Therapeutic Activity 8-22 mins   [name], PT DPT  [phone]

## 2013-03-10 NOTE — Plan of Care (Signed)
Report called to Mariane Masters, Therapist, sports at [facility]. D/c instructions given to pt and pt spouse. No med script given to pt. Pt is stable to be d/c.

## 2013-03-10 NOTE — Clinical Social Work Note (Signed)
CSW has re-submitted (x3) clinicals to [facility]  CSW contacted Sharyn Lull at [facility] to confirm receipt.  Receipt confirmed.  Sharyn Lull will f/u with Raquel Sarna (unit CSW) with authorization.  Nonnie Done, [place] [phone]  Clinical Social Work

## 2013-03-10 NOTE — Progress Notes (Signed)
Occupational Therapy Treatment Patient Details Name: [name] MRN: [id number] DOB: 01-Sep-1945 Today's Date: 03/10/2013 Time: 8786-7672 OT Time Calculation (min): 29 min  OT Assessment / Plan / Recommendation  History of present illness Patient is a 67 yo male with chronic back pain and underwent decompression fusion L1 to S1 11/25. He developed persistent hypotension 11/26 and proved refractory to IVF and required neo drip. We note he is on chronic steroids. PCCM asked to assist in his in care. PMH includes CAD, COPD, RA, chronic pain.   OT comments  Pt progressing towards goals and moving well during session. Cues for precautions. Pt planning to d/c to SNF.   Follow Up Recommendations  SNF;Supervision/Assistance - 24 hour    Barriers to Discharge       Equipment Recommendations  3 in 1 bedside comode    Recommendations for Other Services    Frequency Min 3X/week   Progress towards OT Goals Progress towards OT goals: Progressing toward goals  Plan Discharge plan remains appropriate    Precautions / Restrictions Precautions Precautions: Back Type of Shoulder Precautions: Pt able to state 3/3 precautions. Precaution Booklet Issued: No Precaution Comments: cues for precautions during session. Required Braces or Orthoses: Spinal Brace Spinal Brace: Lumbar corset;Applied in standing position Restrictions Weight Bearing Restrictions: No   Pertinent Vitals/Pain Pain 5/10. Pt sitting with pillows supporting back at end of session.    ADL  Grooming: Performed;Wash/dry hands;Teeth care;Supervision/safety (supplies already in bathroom) Where Assessed - Grooming: Supported standing Upper Body Dressing: Supervision/safety;Set up Where Assessed - Upper Body Dressing: Unsupported sit to stand Lower Body Dressing: Minimal assistance Where Assessed - Lower Body Dressing: Supported sit to stand Toilet Transfer: Supervision/safety Armed forces technical officer Method: Sit to Naval architect: Other (comment) (from bed/chair and also stood to urinate at commode) Toileting - Water quality scientist and Hygiene: Supervision/safety Where Assessed - Best boy and Hygiene: Standing Tub/Shower Transfer: Minimal assistance Tub/Shower Transfer Method: Therapist, art: Walk in shower Equipment Used: Gait belt;Back brace;Reacher;Long-handled sponge;Long-handled shoe horn;Rolling walker;Sock aid Transfers/Ambulation Related to ADLs: Supervision for sit <> stand and ambulation. Min A for shower transfer. ADL Comments: Educated to stand in front of chair/bed with walker in front when pulling up LB clothing. Practiced with AE for LB ADLs. Cues for precautions during session. Pt practiced simulated shower transfer.  Educated on use of bag on walker.    OT Diagnosis:    OT Problem List:   OT Treatment Interventions:     OT Goals(current goals can now be found in the care plan section) Acute Rehab OT Goals Patient Stated Goal: not stated OT Goal Formulation: With patient Time For Goal Achievement: 03/17/13 Potential to Achieve Goals: Good ADL Goals Pt Will Perform Grooming: with modified independence;standing Pt Will Perform Lower Body Bathing: sit to/from stand;with adaptive equipment;with modified independence Pt Will Perform Lower Body Dressing: with modified independence;with adaptive equipment;sit to/from stand Pt Will Transfer to Toilet: with modified independence;bedside commode Pt Will Perform Toileting - Clothing Manipulation and hygiene: with modified independence;with adaptive equipment;sit to/from stand Pt Will Perform Tub/Shower Transfer: with supervision;ambulating;3 in 1 Additional ADL Goal #1: Pt will demonstrate 3/3 back precautions during ADL task independently  Visit Information  Last OT Received On: 03/10/13 Assistance Needed: +1 History of Present Illness: Patient is a 67 yo male with chronic back pain and  underwent decompression fusion L1 to S1 11/25. He developed persistent hypotension 11/26 and proved refractory to IVF and required neo drip. We note  he is on chronic steroids. PCCM asked to assist in his in care. PMH includes CAD, COPD, RA, chronic pain.    Subjective Data      Prior Functioning       Cognition  Cognition Arousal/Alertness: Awake/alert Behavior During Therapy: WFL for tasks assessed/performed Overall Cognitive Status: Within Functional Limits for tasks assessed    Mobility  Bed Mobility Bed Mobility: Rolling Right;Right Sidelying to Sit Rolling Right: 5: Supervision Right Sidelying to Sit: 5: Supervision Details for Bed Mobility Assistance: Cues for precautions and techinique. Transfers Transfers: Sit to Stand;Stand to Sit Sit to Stand: 5: Supervision;With upper extremity assist;From bed;From chair/3-in-1 Stand to Sit: 5: Supervision;To chair/3-in-1 Details for Transfer Assistance: Supervision for safety. Cues for technique.    Exercises    Balance   End of Session OT - End of Session Equipment Utilized During Treatment: Gait belt;Rolling walker;Back brace Activity Tolerance: Patient tolerated treatment well Patient left: in chair;Other (comment) (PT coming in)  GO     Benito Mccreedy OTR/L [phone] 03/10/2013, 10:11 AM

## 2013-03-10 NOTE — Progress Notes (Signed)
Physical Therapy Treatment Patient Details Name: BARRINGTON WORLEY MRN: [id number] DOB: May 26, 1945 Today's Date: 03/10/2013 Time: 8413-2440 PT Time Calculation (min): 16 min  PT Assessment / Plan / Recommendation  History of Present Illness Patient is a 67 yo male with chronic back pain and underwent decompression fusion L1 to S1 11/25. He developed persistent hypotension 11/26 and proved refractory to IVF and required neo drip. We note he is on chronic steroids. PCCM asked to assist in his in care. PMH includes CAD, COPD, RA, chronic pain.   PT Comments   Patient continues to demonstrates improved activity tolerance with ambulation. Occasional cues for precautions but overall good compliance. Will continue to see as indicated and progress activity as tolerated.  Follow Up Recommendations  SNF     Does the patient have the potential to tolerate intense rehabilitation     Barriers to Discharge        Equipment Recommendations  None recommended by PT    Recommendations for Other Services    Frequency Min 5X/week   Progress towards PT Goals Progress towards PT goals: Progressing toward goals  Plan Current plan remains appropriate    Precautions / Restrictions Precautions Precautions: Back Type of Shoulder Precautions: patient demonstrates verbalized knowledge of precautions Precaution Booklet Issued: Yes (comment) Precaution Comments: Good compliance with precautions today Required Braces or Orthoses: Spinal Brace Spinal Brace: Lumbar corset;Applied in sitting position Restrictions Weight Bearing Restrictions: No   Pertinent Vitals/Pain 5/10 pain    Mobility  Bed Mobility Bed Mobility: Not assessed Transfers Transfers: Stand to Sit;Sit to Stand Sit to Stand: 5: Supervision;With upper extremity assist;From bed Stand to Sit: 5: Supervision;With upper extremity assist;To chair/3-in-1 Details for Transfer Assistance: cues for UE use.   Ambulation/Gait Ambulation/Gait  Assistance: 5: Supervision Ambulation Distance (Feet): 620 Feet Assistive device: Rolling walker Ambulation/Gait Assistance Details: Good stability with ambulation today Gait Pattern: Step-through pattern;Decreased stride length Stairs: No      PT Goals (current goals can now be found in the care plan section) Acute Rehab PT Goals Patient Stated Goal: to go to rehab PT Goal Formulation: With patient Time For Goal Achievement: 03/12/13 Potential to Achieve Goals: Good  Visit Information  Last PT Received On: 03/10/13 Assistance Needed: +1 History of Present Illness: Patient is a 67 yo male with chronic back pain and underwent decompression fusion L1 to S1 11/25. He developed persistent hypotension 11/26 and proved refractory to IVF and required neo drip. We note he is on chronic steroids. PCCM asked to assist in his in care. PMH includes CAD, COPD, RA, chronic pain.    Subjective Data  Subjective: I feel like a few more days and I will be good Patient Stated Goal: to go to rehab   Cognition  Cognition Arousal/Alertness: Awake/alert Behavior During Therapy: WFL for tasks assessed/performed Overall Cognitive Status: Within Functional Limits for tasks assessed    Balance  Dynamic Standing Balance Dynamic Standing - Balance Support: Bilateral upper extremity supported Dynamic Standing - Level of Assistance: 6: Modified independent (Device/Increase time)  End of Session PT - End of Session Equipment Utilized During Treatment: Gait belt;Back brace Activity Tolerance: Patient tolerated treatment well Patient left: in chair;with call bell/phone within reach Nurse Communication: Mobility status   GP     [name] 03/10/2013, 9:02 AM [name], [place] DPT  [phone]

## 2013-03-11 ENCOUNTER — Other Ambulatory Visit: Payer: Self-pay | Admitting: *Deleted

## 2013-03-11 MED ORDER — OXYCODONE HCL ER 20 MG PO T12A: 20.0000 mg | EXTENDED_RELEASE_TABLET | Freq: Two times a day (BID) | ORAL | Status: DC

## 2013-03-11 MED ORDER — HYDROMORPHONE HCL 2 MG PO TABS: ORAL_TABLET | ORAL | Status: DC

## 2013-03-11 MED ORDER — ALPRAZOLAM 0.5 MG PO TABS: 0.5000 mg | ORAL_TABLET | Freq: Three times a day (TID) | ORAL | Status: DC | PRN

## 2013-03-16 ENCOUNTER — Inpatient Hospital Stay (HOSPITAL_COMMUNITY)
Admission: AD | Admit: 2013-03-16 | Discharge: 2013-04-06 | DRG: 863 | Disposition: A | Discharge status: Home Health | Payer: Medicare Other | Source: Ambulatory Visit | Attending: Neurosurgery | Admitting: Neurosurgery

## 2013-03-16 DIAGNOSIS — Z79899 Other long term (current) drug therapy: Secondary | ICD-10-CM

## 2013-03-16 DIAGNOSIS — Y831 Surgical operation with implant of artificial internal device as the cause of abnormal reaction of the patient, or of later complication, without mention of misadventure at the time of the procedure: Secondary | ICD-10-CM | POA: Diagnosis present

## 2013-03-16 DIAGNOSIS — F329 Major depressive disorder, single episode, unspecified: Secondary | ICD-10-CM | POA: Diagnosis present

## 2013-03-16 DIAGNOSIS — I10 Essential (primary) hypertension: Secondary | ICD-10-CM | POA: Diagnosis present

## 2013-03-16 DIAGNOSIS — J4489 Other specified chronic obstructive pulmonary disease: Secondary | ICD-10-CM | POA: Diagnosis present

## 2013-03-16 DIAGNOSIS — IMO0002 Reserved for concepts with insufficient information to code with codable children: Secondary | ICD-10-CM | POA: Diagnosis present

## 2013-03-16 DIAGNOSIS — B965 Pseudomonas (aeruginosa) (mallei) (pseudomallei) as the cause of diseases classified elsewhere: Secondary | ICD-10-CM | POA: Diagnosis present

## 2013-03-16 DIAGNOSIS — J449 Chronic obstructive pulmonary disease, unspecified: Secondary | ICD-10-CM | POA: Diagnosis present

## 2013-03-16 DIAGNOSIS — T8130XA Disruption of wound, unspecified, initial encounter: Secondary | ICD-10-CM | POA: Diagnosis not present

## 2013-03-16 DIAGNOSIS — Z862 Personal history of diseases of the blood and blood-forming organs and certain disorders involving the immune mechanism: Secondary | ICD-10-CM

## 2013-03-16 DIAGNOSIS — D649 Anemia, unspecified: Secondary | ICD-10-CM

## 2013-03-16 DIAGNOSIS — Z7982 Long term (current) use of aspirin: Secondary | ICD-10-CM

## 2013-03-16 DIAGNOSIS — A498 Other bacterial infections of unspecified site: Secondary | ICD-10-CM

## 2013-03-16 DIAGNOSIS — F3289 Other specified depressive episodes: Secondary | ICD-10-CM | POA: Diagnosis present

## 2013-03-16 DIAGNOSIS — E871 Hypo-osmolality and hyponatremia: Secondary | ICD-10-CM | POA: Diagnosis present

## 2013-03-16 DIAGNOSIS — E785 Hyperlipidemia, unspecified: Secondary | ICD-10-CM | POA: Diagnosis present

## 2013-03-16 DIAGNOSIS — R251 Tremor, unspecified: Secondary | ICD-10-CM

## 2013-03-16 DIAGNOSIS — K219 Gastro-esophageal reflux disease without esophagitis: Secondary | ICD-10-CM | POA: Diagnosis present

## 2013-03-16 DIAGNOSIS — T8140XA Infection following a procedure, unspecified, initial encounter: Principal | ICD-10-CM | POA: Diagnosis present

## 2013-03-16 DIAGNOSIS — D509 Iron deficiency anemia, unspecified: Secondary | ICD-10-CM | POA: Diagnosis present

## 2013-03-16 DIAGNOSIS — I4891 Unspecified atrial fibrillation: Secondary | ICD-10-CM | POA: Diagnosis present

## 2013-03-16 DIAGNOSIS — F411 Generalized anxiety disorder: Secondary | ICD-10-CM | POA: Diagnosis present

## 2013-03-16 DIAGNOSIS — IMO0001 Reserved for inherently not codable concepts without codable children: Secondary | ICD-10-CM

## 2013-03-16 DIAGNOSIS — Z9861 Coronary angioplasty status: Secondary | ICD-10-CM

## 2013-03-16 DIAGNOSIS — M069 Rheumatoid arthritis, unspecified: Secondary | ICD-10-CM | POA: Diagnosis present

## 2013-03-16 DIAGNOSIS — M5137 Other intervertebral disc degeneration, lumbosacral region: Secondary | ICD-10-CM

## 2013-03-16 DIAGNOSIS — I2510 Atherosclerotic heart disease of native coronary artery without angina pectoris: Secondary | ICD-10-CM | POA: Diagnosis present

## 2013-03-16 DIAGNOSIS — F05 Delirium due to known physiological condition: Secondary | ICD-10-CM

## 2013-03-16 DIAGNOSIS — I872 Venous insufficiency (chronic) (peripheral): Secondary | ICD-10-CM | POA: Diagnosis present

## 2013-03-16 DIAGNOSIS — E861 Hypovolemia: Secondary | ICD-10-CM | POA: Diagnosis present

## 2013-03-16 DIAGNOSIS — E876 Hypokalemia: Secondary | ICD-10-CM | POA: Diagnosis not present

## 2013-03-16 DIAGNOSIS — M159 Polyosteoarthritis, unspecified: Secondary | ICD-10-CM

## 2013-03-16 DIAGNOSIS — G8929 Other chronic pain: Secondary | ICD-10-CM

## 2013-03-16 DIAGNOSIS — Z96619 Presence of unspecified artificial shoulder joint: Secondary | ICD-10-CM

## 2013-03-16 DIAGNOSIS — B370 Candidal stomatitis: Secondary | ICD-10-CM | POA: Diagnosis not present

## 2013-03-16 DIAGNOSIS — N390 Urinary tract infection, site not specified: Secondary | ICD-10-CM | POA: Diagnosis present

## 2013-03-16 DIAGNOSIS — B999 Unspecified infectious disease: Secondary | ICD-10-CM | POA: Diagnosis present

## 2013-03-16 DIAGNOSIS — Z96659 Presence of unspecified artificial knee joint: Secondary | ICD-10-CM

## 2013-03-16 DIAGNOSIS — M51379 Other intervertebral disc degeneration, lumbosacral region without mention of lumbar back pain or lower extremity pain: Secondary | ICD-10-CM

## 2013-03-16 DIAGNOSIS — E860 Dehydration: Secondary | ICD-10-CM | POA: Diagnosis present

## 2013-03-16 LAB — CBC WITH DIFFERENTIAL/PLATELET
Basophils Absolute: 0 10*3/uL (ref 0.0–0.1)
Basophils Relative: 0 % (ref 0–1)
Eosinophils Absolute: 0 10*3/uL (ref 0.0–0.7)
Eosinophils Relative: 0 % (ref 0–5)
HCT: 29.2 % — ABNORMAL LOW (ref 39.0–52.0)
Hemoglobin: 10.2 g/dL — ABNORMAL LOW (ref 13.0–17.0)
Lymphocytes Relative: 10 % — ABNORMAL LOW (ref 12–46)
Lymphs Abs: 1.5 10*3/uL (ref 0.7–4.0)
MCH: 30.3 pg (ref 26.0–34.0)
MCHC: 34.9 g/dL (ref 30.0–36.0)
MCV: 86.6 fL (ref 78.0–100.0)
Monocytes Absolute: 0.9 10*3/uL (ref 0.1–1.0)
Monocytes Relative: 6 % (ref 3–12)
Neutro Abs: 13.4 10*3/uL — ABNORMAL HIGH (ref 1.7–7.7)
Neutrophils Relative %: 84 % — ABNORMAL HIGH (ref 43–77)
Platelets: 354 10*3/uL (ref 150–400)
RBC: 3.37 MIL/uL — ABNORMAL LOW (ref 4.22–5.81)
RDW: 13.7 % (ref 11.5–15.5)
WBC: 15.9 10*3/uL — ABNORMAL HIGH (ref 4.0–10.5)

## 2013-03-16 LAB — SEDIMENTATION RATE: Sed Rate: 31 mm/hr — ABNORMAL HIGH (ref 0–16)

## 2013-03-16 LAB — BASIC METABOLIC PANEL
BUN: 45 mg/dL — ABNORMAL HIGH (ref 6–23)
CO2: 22 mEq/L (ref 19–32)
Calcium: 9.5 mg/dL (ref 8.4–10.5)
Chloride: 84 mEq/L — ABNORMAL LOW (ref 96–112)
Creatinine, Ser: 1.28 mg/dL (ref 0.50–1.35)
GFR calc Af Amer: 66 mL/min — ABNORMAL LOW (ref >90)
GFR calc non Af Amer: 57 mL/min — ABNORMAL LOW (ref >90)
Glucose, Bld: 195 mg/dL — ABNORMAL HIGH (ref 70–99)
Potassium: 4.3 mEq/L (ref 3.5–5.1)
Sodium: 120 mEq/L — ABNORMAL LOW (ref 135–145)

## 2013-03-16 MED ORDER — PANTOPRAZOLE SODIUM 40 MG PO TBEC
40.0000 mg | DELAYED_RELEASE_TABLET | Freq: Two times a day (BID) | ORAL | Status: DC
  Administered 2013-03-16 – 2013-04-06 (×41): 40 mg via ORAL
  Filled 2013-03-16 (×38): qty 1

## 2013-03-16 MED ORDER — ACETAMINOPHEN 325 MG PO TABS
650.0000 mg | ORAL_TABLET | ORAL | Status: DC | PRN
  Administered 2013-03-18 – 2013-03-24 (×5): 650 mg via ORAL
  Filled 2013-03-16 (×5): qty 2

## 2013-03-16 MED ORDER — VANCOMYCIN HCL IN DEXTROSE 750-5 MG/150ML-% IV SOLN
750.0000 mg | Freq: Three times a day (TID) | INTRAVENOUS | Status: DC
Start: 2013-03-16 — End: 2013-03-17
  Administered 2013-03-16 – 2013-03-17 (×3): 750 mg via INTRAVENOUS
  Filled 2013-03-16 (×4): qty 150

## 2013-03-16 MED ORDER — SODIUM CHLORIDE 0.9 % IJ SOLN: 3.0000 mL | INTRAMUSCULAR | Status: DC | PRN

## 2013-03-16 MED ORDER — LOSARTAN POTASSIUM 50 MG PO TABS
50.0000 mg | ORAL_TABLET | Freq: Every day | ORAL | Status: DC
  Administered 2013-03-17 – 2013-04-06 (×20): 50 mg via ORAL
  Filled 2013-03-16 (×21): qty 1

## 2013-03-16 MED ORDER — SIMVASTATIN 40 MG PO TABS
40.0000 mg | ORAL_TABLET | Freq: Every day | ORAL | Status: DC
  Administered 2013-03-16 – 2013-04-05 (×19): 40 mg via ORAL
  Filled 2013-03-16 (×22): qty 1

## 2013-03-16 MED ORDER — ACETAMINOPHEN 650 MG RE SUPP: 650.0000 mg | RECTAL | Status: DC | PRN

## 2013-03-16 MED ORDER — DIAZEPAM 5 MG PO TABS
5.0000 mg | ORAL_TABLET | Freq: Four times a day (QID) | ORAL | Status: DC | PRN
  Administered 2013-03-16 – 2013-04-03 (×16): 5 mg via ORAL
  Filled 2013-03-16 (×18): qty 1

## 2013-03-16 MED ORDER — PREDNISONE 5 MG PO TABS
5.0000 mg | ORAL_TABLET | Freq: Every day | ORAL | Status: DC
  Administered 2013-03-16 – 2013-04-05 (×20): 5 mg via ORAL
  Filled 2013-03-16 (×22): qty 1

## 2013-03-16 MED ORDER — FLUOXETINE HCL 40 MG PO CAPS: 40.0000 mg | ORAL_CAPSULE | Freq: Two times a day (BID) | ORAL | Status: DC

## 2013-03-16 MED ORDER — HYDROCHLOROTHIAZIDE 25 MG PO TABS
25.0000 mg | ORAL_TABLET | Freq: Every day | ORAL | Status: DC
  Administered 2013-03-17: 25 mg via ORAL
  Filled 2013-03-16 (×2): qty 1

## 2013-03-16 MED ORDER — TIOTROPIUM BROMIDE MONOHYDRATE 18 MCG IN CAPS
18.0000 ug | ORAL_CAPSULE | Freq: Every day | RESPIRATORY_TRACT | Status: DC
  Administered 2013-03-17 – 2013-04-06 (×19): 18 ug via RESPIRATORY_TRACT
  Filled 2013-03-16 (×4): qty 5

## 2013-03-16 MED ORDER — GABAPENTIN 300 MG PO CAPS
600.0000 mg | ORAL_CAPSULE | Freq: Two times a day (BID) | ORAL | Status: DC
  Administered 2013-03-16 – 2013-04-06 (×40): 600 mg via ORAL
  Filled 2013-03-16 (×45): qty 2

## 2013-03-16 MED ORDER — ALPRAZOLAM 0.5 MG PO TABS: 0.5000 mg | ORAL_TABLET | Freq: Three times a day (TID) | ORAL | Status: DC | PRN

## 2013-03-16 MED ORDER — SODIUM CHLORIDE 0.9 % IJ SOLN
3.0000 mL | Freq: Two times a day (BID) | INTRAMUSCULAR | Status: DC
  Administered 2013-03-18 – 2013-03-19 (×2): 3 mL via INTRAVENOUS

## 2013-03-16 MED ORDER — ASPIRIN EC 81 MG PO TBEC
81.0000 mg | DELAYED_RELEASE_TABLET | Freq: Every day | ORAL | Status: DC
  Administered 2013-03-17 – 2013-04-06 (×21): 81 mg via ORAL
  Filled 2013-03-16 (×21): qty 1

## 2013-03-16 MED ORDER — DOCUSATE SODIUM 100 MG PO CAPS
100.0000 mg | ORAL_CAPSULE | Freq: Two times a day (BID) | ORAL | Status: DC
  Administered 2013-03-16 – 2013-04-06 (×37): 100 mg via ORAL
  Filled 2013-03-16 (×36): qty 1

## 2013-03-16 MED ORDER — FUROSEMIDE 20 MG PO TABS
20.0000 mg | ORAL_TABLET | Freq: Every day | ORAL | Status: DC
  Administered 2013-03-16 – 2013-03-17 (×2): 20 mg via ORAL
  Filled 2013-03-16 (×2): qty 1

## 2013-03-16 MED ORDER — ASPIRIN 81 MG PO TBEC: 81.0000 mg | DELAYED_RELEASE_TABLET | Freq: Every morning | ORAL | Status: DC

## 2013-03-16 MED ORDER — GABAPENTIN 300 MG PO CAPS: 300.0000 mg | ORAL_CAPSULE | Freq: Every day | ORAL | Status: DC

## 2013-03-16 MED ORDER — HYDROMORPHONE HCL 2 MG PO TABS
2.0000 mg | ORAL_TABLET | ORAL | Status: DC | PRN
  Administered 2013-03-17 (×2): 2 mg via ORAL
  Filled 2013-03-16 (×2): qty 1

## 2013-03-16 MED ORDER — GABAPENTIN 300 MG PO CAPS
300.0000 mg | ORAL_CAPSULE | ORAL | Status: DC
  Administered 2013-03-17 – 2013-04-05 (×36): 300 mg via ORAL
  Filled 2013-03-16 (×43): qty 1

## 2013-03-16 MED ORDER — POTASSIUM CHLORIDE CRYS ER 20 MEQ PO TBCR
20.0000 meq | EXTENDED_RELEASE_TABLET | Freq: Every day | ORAL | Status: DC
  Administered 2013-03-17: 20 meq via ORAL
  Filled 2013-03-16: qty 1

## 2013-03-16 MED ORDER — PHENOL 1.4 % MT LIQD: 1.0000 | OROMUCOSAL | Status: DC | PRN

## 2013-03-16 MED ORDER — ONDANSETRON HCL 4 MG/2ML IJ SOLN: 4.0000 mg | INTRAMUSCULAR | Status: DC | PRN

## 2013-03-16 MED ORDER — SODIUM CHLORIDE 0.9 % IV SOLN: 250.0000 mL | INTRAVENOUS | Status: DC

## 2013-03-16 MED ORDER — FLUTICASONE PROPIONATE 50 MCG/ACT NA SUSP
2.0000 | Freq: Every day | NASAL | Status: DC
  Administered 2013-03-17 – 2013-04-06 (×20): 2 via NASAL
  Filled 2013-03-16 (×2): qty 16

## 2013-03-16 MED ORDER — HEPARIN SODIUM (PORCINE) 5000 UNIT/ML IJ SOLN
5000.0000 [IU] | Freq: Three times a day (TID) | INTRAMUSCULAR | Status: DC
  Administered 2013-03-17 – 2013-04-06 (×56): 5000 [IU] via SUBCUTANEOUS
  Filled 2013-03-16 (×65): qty 1

## 2013-03-16 MED ORDER — OXYCODONE-ACETAMINOPHEN 5-325 MG PO TABS
1.0000 | ORAL_TABLET | ORAL | Status: DC | PRN
  Administered 2013-03-18: 1 via ORAL
  Filled 2013-03-16: qty 2
  Filled 2013-03-16 (×2): qty 1

## 2013-03-16 MED ORDER — FLUOXETINE HCL 20 MG PO CAPS
40.0000 mg | ORAL_CAPSULE | Freq: Two times a day (BID) | ORAL | Status: DC
  Administered 2013-03-16 – 2013-04-06 (×41): 40 mg via ORAL
  Filled 2013-03-16 (×43): qty 2

## 2013-03-16 MED ORDER — TRAZODONE HCL 100 MG PO TABS
100.0000 mg | ORAL_TABLET | Freq: Every day | ORAL | Status: DC
  Administered 2013-03-16 – 2013-04-05 (×20): 100 mg via ORAL
  Filled 2013-03-16 (×22): qty 1

## 2013-03-16 MED ORDER — SODIUM CHLORIDE 0.9 % IV SOLN
INTRAVENOUS | Status: DC
  Administered 2013-03-16 – 2013-03-22 (×5): via INTRAVENOUS

## 2013-03-16 MED ORDER — ZOLPIDEM TARTRATE 5 MG PO TABS
5.0000 mg | ORAL_TABLET | Freq: Every evening | ORAL | Status: DC | PRN
  Administered 2013-03-26 – 2013-03-28 (×3): 5 mg via ORAL
  Filled 2013-03-16 (×3): qty 1

## 2013-03-16 MED ORDER — MENTHOL 3 MG MT LOZG: 1.0000 | LOZENGE | OROMUCOSAL | Status: DC | PRN

## 2013-03-16 MED ORDER — OXYCODONE HCL ER 10 MG PO T12A
20.0000 mg | EXTENDED_RELEASE_TABLET | Freq: Two times a day (BID) | ORAL | Status: DC
  Administered 2013-03-16 – 2013-03-19 (×5): 20 mg via ORAL
  Filled 2013-03-16 (×6): qty 2

## 2013-03-16 MED ORDER — COLCHICINE 0.6 MG PO TABS
0.6000 mg | ORAL_TABLET | Freq: Two times a day (BID) | ORAL | Status: DC
  Administered 2013-03-16 – 2013-04-06 (×40): 0.6 mg via ORAL
  Filled 2013-03-16 (×45): qty 1

## 2013-03-16 NOTE — H&P (Signed)
[name] is an 67 y.o. male.   Chief Complaint: fever and vomiting HPI: patient who underwent a l1 to s1 fusion about 10 days ago. i was to see him in my office yesterday because wound drainage and fever but he was a no show. Today his wife called because of temperature to 102 and one episode of vomiting, he was admittes straight to the floor  Past Medical History  Diagnosis Date  . Hx of colonic polyps   . Diverticulosis   . Hypertension   . Osteoarthritis   . 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 CAD (coronary artery disease)     minimal coronary plaque in the LAD and right coronary system. 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 7-06. Last nuclear stress 11-17-06 fixed anterior/ inferior defect, no inducible ischemia, EF 81%  . Allergic rhinitis   . Hx of colonoscopy   . RA (rheumatoid arthritis)   . COPD (chronic obstructive pulmonary disease)   . PONV (postoperative nausea and vomiting)   . Dysrhythmia 01-24-12    past hx. A.Fib x1 episode-responded to med.  . Depression   . Anxiety     pt. feels its related to the medicine that he takes   . GERD (gastroesophageal reflux disease)     Past Surgical History  Procedure Laterality Date  . Appendectomy    . Inguinal hernia repair  2008    Right, left remotely  . Total knee arthroplasty      right knee partial 2002  . Tonsillectomy    . Shoulder preplacement  2008    left, partial  . Ptca w/ coated stent lad      Cx secondary to tear  . Foot surgery  11-08    for removal of bone spurs- right foot  . Reconstruction of ankle  6-09    Right- [facility]  . L4-5 diskectomy w/ fusion, cage placement and rods  12-10    Gerome Kokesh  . Partial knee arthroplasty      left  . Total shoulder replacement      right  . Joint replacement    . Cataract extraction, bilateral  01-24-12    Bilateral  . Orif shoulder fracture   02/06/2012    Procedure: OPEN REDUCTION INTERNAL FIXATION (ORIF) SHOULDER FRACTURE;  Surgeon: Nita Sells, MD;  Location: [facility];  Service: Orthopedics;  Laterality: Left;  ORIF of a Left Shoulder Fracture with  Iliac Crest Bone Graft aspiration   . Harvest bone graft  02/06/2012    Procedure: HARVEST ILIAC BONE GRAFT;  Surgeon: Nita Sells, MD;  Location: [facility];  Service: Orthopedics;;  bone marrow aspirqation   . Hardware removal  03/09/2012    Procedure: HARDWARE REMOVAL;  Surgeon: Nita Sells, MD;  Location: [facility];  Service: Orthopedics;  Laterality: Left;  Hardware Removal from Left Shoulder  . Cardiac catheterization  2006    coronary stents- no problems  . Shoulder arthroscopy  2013  . Carpal tunnel release      R hand  . Ankle surgery Right 2008    at [facility], replacement   . Dg toes*r*      toe surgery- 07/2012  . Posterior lumbar fusion 4 level N/A 03/02/2013    Procedure: Lumbar One to Sacral One Posterior lumbar interbody fusion;  Surgeon: Floyce Stakes, MD;  Location: [facility];  Service: Neurosurgery;  Laterality: N/A;  L1 to S1 Posterior lumbar interbody fusion    Family History  Problem Relation Age of Onset  . Uterine cancer Mother     survivor  . Macular degeneration Mother   . Other Mother     ankle edema  . Lung cancer Mother   . Cancer Mother     ovarian, lung  . Coronary artery disease Father   . Hypertension Father   . Prostate cancer Father   . Colon polyps Father   . Cancer Father     prostate  . Thyroid disease Sister   . Heart attack Brother   . Hyperlipidemia Brother   . Other Brother     Schizophrenic  . Diabetes Neg Hx   . Colon cancer Neg Hx   . Esophageal cancer Neg Hx   . Rectal cancer Neg Hx   . Stomach cancer Neg Hx   . Coronary artery disease Maternal Aunt   . Heart attack Maternal Aunt   . Rheum arthritis Sister   . Hemochromatosis Sister    Social History:  reports that  he has never smoked. He has never used smokeless tobacco. He reports that he does not drink alcohol or use illicit drugs.  Allergies:  Allergies  Allergen Reactions  . Morphine And Related Shortness Of Breath, Nausea And Vomiting and Swelling  . Penicillins Hives, Shortness Of Breath and Rash    REACTION: hives, breathing problems  . Percocet [Oxycodone-Acetaminophen] Anxiety    Medications Prior to Admission  Medication Sig Dispense Refill  . Aclidinium Bromide (TUDORZA PRESSAIR) 400 MCG/ACT AEPB Inhale 1 puff into the lungs 2 (two) times daily.  1 each  11  . adalimumab (HUMIRA) 40 MG/0.8ML injection Inject 40 mg into the skin every 14 (fourteen) days.      . ALPRAZolam (XANAX) 0.5 MG tablet Take 1 tablet (0.5 mg total) by mouth 3 (three) times daily as needed for anxiety or sleep.  90 tablet  5  . aspirin (ASPIRIN EC) 81 MG EC tablet Take 81 mg by mouth every morning.       . celecoxib (CELEBREX) 200 MG capsule Take 200 mg by mouth 2 (two) times daily.      . cholecalciferol (VITAMIN D) 1000 UNITS tablet Take 1 tablet (1,000 Units total) by mouth daily.  100 tablet  3  . COLCRYS 0.6 MG tablet TAKE 1 TABLET (0.6 MG TOTAL) BY MOUTH 2 (TWO) TIMES DAILY.  60 tablet  5  . FLUoxetine (PROZAC) 40 MG capsule Take 40 mg by mouth 2 (two) times daily.       . fluticasone (FLONASE) 50 MCG/ACT nasal spray Place 2 sprays into both nostrils daily.      . furosemide (LASIX) 20 MG tablet Take 20 mg by mouth daily.      [name] gabapentin (NEURONTIN) 300 MG capsule Take 300 mg by mouth daily. 600 mg every morning, 300 mg at noon, 600 mg at bedtime, and 300 mg at 1:00 am.      . hydrochlorothiazide (HYDRODIURIL) 25 MG tablet Take 25 mg by mouth daily before breakfast.       . HYDROmorphone (DILAUDID) 2 MG tablet Take one tablet by mouth every four hours as needed  180 tablet  0  . losartan (COZAAR) 50 MG tablet TAKE 1 TABLET BY MOUTH ONCE A DAY  90 tablet  3  . Melatonin ([employer] MELATONIN) 5 MG TABS Take 5 mg by  mouth at bedtime.       . OxyCODONE (OXYCONTIN) 20 mg T12A 12 hr tablet Take 1 tablet (20 mg total) by mouth every 12 (twelve) hours.  60 tablet  0  . pantoprazole (PROTONIX) 40 MG tablet Take 40 mg by mouth 2 (two) times daily.       Vladimir Faster Glycol-Propyl Glycol (SYSTANE ULTRA) 0.4-0.3 % SOLN Apply 2 drops to eye 2 (two) times daily as needed (dry eyes).       . potassium chloride SA (K-DUR,KLOR-CON) 20 MEQ tablet Take 20 mEq by mouth daily.      . predniSONE (DELTASONE) 5 MG tablet Take 5 mg by mouth every evening.       [name] PRESCRIPTION MEDICATION Inject as directed once.      . simvastatin (ZOCOR) 40 MG tablet Take 40 mg by mouth daily. At lunch      . traZODone (DESYREL) 100 MG tablet Take 100 mg by mouth at bedtime. Take just before bedtime      . Elastic Bandages & Supports (ABDOMINAL BINDER/ELASTIC MED) MISC 1 Device by Does not apply route as needed.  1 each  0    No results found for this or any previous visit (from the past 48 hour(s)). No results found.  Review of Systems  Constitutional: Positive for fever.  Eyes: Negative.   Cardiovascular: Negative.   Gastrointestinal: Positive for vomiting.  Genitourinary: Negative.   Musculoskeletal: Positive for back pain.  Skin: Negative.   Neurological: Negative.   Endo/Heme/Allergies: Negative.   Psychiatric/Behavioral: Negative.     Blood pressure 91/56, pulse 107, temperature 98.1 F (36.7 C), temperature source Oral, resp. rate 18, SpO2 93.00%. Physical Exam hent, nl. Neck , no stiffness. Cv, nl. Lungs, clear. Abdomen, softt. Extremities nl. Neuro intact as preop. The lumbar woud has some redness at the edges but no drainage. There is no fluid collection  Assessment/Plan Plan to do a fever w/u to r/o postop wound infection. Spoke with him and his wife  Floyce Stakes 03/16/2013, 4:31 PM

## 2013-03-16 NOTE — Progress Notes (Signed)
ANTIBIOTIC CONSULT NOTE - INITIAL  Pharmacy Consult for vancomycin Indication: Post op lumbar infection  Allergies  Allergen Reactions  . Morphine And Related Shortness Of Breath, Nausea And Vomiting and Swelling  . Penicillins Hives, Shortness Of Breath and Rash    REACTION: hives, breathing problems  . Percocet [Oxycodone-Acetaminophen] Anxiety    Patient Measurements: Height: 5' 6.14" (168 cm) Weight: 158 lb 4.6 oz (71.8 kg) IBW/kg (Calculated) : 64.13 Adjusted Body Weight:   Vital Signs: Temp: 97.7 F (36.5 C) (12/09 2218) Temp src: Oral (12/09 2218) BP: 141/85 mmHg (12/09 2218) Pulse Rate: 112 (12/09 2218) Intake/Output from previous day:   Intake/Output from this shift:    Labs:  Recent Labs  03/16/13 1915  WBC 15.9*  HGB 10.2*  PLT 354  CREATININE 1.28   Estimated Creatinine Clearance: 52.2 ml/min (by C-G formula based on Cr of 1.28). No results found for this basename: VANCOTROUGH, Corlis Leak, VANCORANDOM, GENTTROUGH, GENTPEAK, GENTRANDOM, TOBRATROUGH, TOBRAPEAK, TOBRARND, AMIKACINPEAK, AMIKACINTROU, AMIKACIN,  in the last 72 hours   Microbiology: Recent Results (from the past 720 hour(s))  SURGICAL PCR SCREEN     Status: None   Collection Time    02/23/13 11:22 AM      Result Value Range Status   MRSA, PCR NEGATIVE  NEGATIVE Final   Staphylococcus aureus NEGATIVE  NEGATIVE Final   Comment:            The Xpert SA Assay (FDA     approved for NASAL specimens     in patients over 84 years of age),     is one component of     a comprehensive surveillance     program.  Test performance has     been validated by [employer] for patients greater     than or equal to 37 year old.     It is not intended     to diagnose infection nor to     guide or monitor treatment.    Medical History: Past Medical History  Diagnosis Date  . Hx of colonic polyps   . Diverticulosis   . Hypertension   . Osteoarthritis   . Hemochromatosis     dx'd 14 yrs ago  last ferritin Aug 11, 08 52 (22-322), Fe 136  . CAD (coronary artery disease)     minimal coronary plaque in the LAD and right coronary system. 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 7-06. Last nuclear stress 11-17-06 fixed anterior/ inferior defect, no inducible ischemia, EF 81%  . Allergic rhinitis   . Hx of colonoscopy   . RA (rheumatoid arthritis)   . COPD (chronic obstructive pulmonary disease)   . PONV (postoperative nausea and vomiting)   . Dysrhythmia 01-24-12    past hx. A.Fib x1 episode-responded to med.  . Depression   . Anxiety     pt. feels its related to the medicine that he takes   . GERD (gastroesophageal reflux disease)     Medications:  Scheduled:  . [START ON 03/17/2013] aspirin EC  81 mg Oral Daily  . colchicine  0.6 mg Oral BID  . docusate sodium  100 mg Oral BID  . FLUoxetine  40 mg Oral BID  . [START ON 03/17/2013] fluticasone  2 spray Each Nare Daily  . furosemide  20 mg Oral Daily  . [START ON 03/17/2013] gabapentin  300 mg Oral Custom  . gabapentin  600 mg  Oral BID  . [START ON 03/17/2013] heparin subcutaneous  5,000 Units Subcutaneous Q8H  . [START ON 03/17/2013] hydrochlorothiazide  25 mg Oral QAC breakfast  . [START ON 03/17/2013] losartan  50 mg Oral Daily  . OxyCODONE  20 mg Oral Q12H  . pantoprazole  40 mg Oral BID  . [START ON 03/17/2013] potassium chloride SA  20 mEq Oral Daily  . predniSONE  5 mg Oral Q supper  . simvastatin  40 mg Oral q1800  . sodium chloride  3 mL Intravenous Q12H  . [START ON 03/17/2013] tiotropium  18 mcg Inhalation Daily  . traZODone  100 mg Oral QHS  . vancomycin  750 mg Intravenous Q8H   Assessment: Pt had a lumbar fusion 10 days ago. Did not show up for his post op appointment but wife called today to report fever, vomiting and wound drainage. He was admitted directly to the hospital. MD has ordered Vanc per pharmacy.  Goal of Therapy:  Vancomycin  trough level 15-20 mcg/ml  Plan:  Vancomycin 750 mg IV Q8h Vanc trough when appropriate.  Minta Balsam 03/16/2013,10:57 PM

## 2013-03-16 NOTE — Progress Notes (Signed)
Pt arrived in 4N19 as direct admit, pt A&Ox3, c/o minimal pain, inferior aspect of incision reddened with slight purulent drainage. Per wife, new onset confusion since this morning.  Fevers since last night, reaching 102 early this morning.  Pt also had an episode of nausea and vomiting bile at home prior to admission.  Pt now afebrile but still confused.  MD in surgery but aware of admission, pt to be seen after.  No orders at this time.  Pt resting/comfortable for now, c/o only of indigestion r/t vomiting episode. Will monitor.

## 2013-03-16 NOTE — Progress Notes (Signed)
Lab called to say culture that was taking from the wound earlier was unacceptable. MD if you want anaerobic specimen please reorder per lab tech.

## 2013-03-17 ENCOUNTER — Encounter (HOSPITAL_COMMUNITY): Payer: Medicare Other | Admitting: Anesthesiology

## 2013-03-17 ENCOUNTER — Inpatient Hospital Stay (HOSPITAL_COMMUNITY): Payer: Medicare Other | Admitting: Anesthesiology

## 2013-03-17 ENCOUNTER — Encounter (HOSPITAL_COMMUNITY)
Admission: AD | Disposition: A | Discharge status: Home Health | Payer: Self-pay | Source: Ambulatory Visit | Attending: Neurosurgery

## 2013-03-17 ENCOUNTER — Inpatient Hospital Stay (HOSPITAL_COMMUNITY): Payer: Medicare Other

## 2013-03-17 DIAGNOSIS — E860 Dehydration: Secondary | ICD-10-CM | POA: Diagnosis present

## 2013-03-17 DIAGNOSIS — E871 Hypo-osmolality and hyponatremia: Secondary | ICD-10-CM | POA: Diagnosis present

## 2013-03-17 DIAGNOSIS — K219 Gastro-esophageal reflux disease without esophagitis: Secondary | ICD-10-CM | POA: Diagnosis present

## 2013-03-17 HISTORY — PX: LUMBAR WOUND DEBRIDEMENT: SHX1988

## 2013-03-17 LAB — BASIC METABOLIC PANEL
BUN: 19 mg/dL (ref 6–23)
CO2: 32 mEq/L (ref 19–32)
Calcium: 8.3 mg/dL — ABNORMAL LOW (ref 8.4–10.5)
Chloride: 88 mEq/L — ABNORMAL LOW (ref 96–112)
Creatinine, Ser: 0.67 mg/dL (ref 0.50–1.35)
GFR calc Af Amer: >90 mL/min (ref >90)
GFR calc non Af Amer: >90 mL/min (ref >90)
Glucose, Bld: 102 mg/dL — ABNORMAL HIGH (ref 70–99)
Potassium: 2.7 mEq/L — CL (ref 3.5–5.1)
Sodium: 128 mEq/L — ABNORMAL LOW (ref 135–145)

## 2013-03-17 LAB — GRAM STAIN

## 2013-03-17 LAB — MAGNESIUM: Magnesium: 1.8 mg/dL (ref 1.5–2.5)

## 2013-03-17 SURGERY — LUMBAR WOUND DEBRIDEMENT
Anesthesia: General

## 2013-03-17 MED ORDER — MAGIC MOUTHWASH
15.0000 mL | Freq: Three times a day (TID) | ORAL | Status: DC
  Administered 2013-03-18 – 2013-04-06 (×57): 15 mL via ORAL
  Filled 2013-03-17 (×61): qty 15

## 2013-03-17 MED ORDER — THROMBIN 5000 UNITS EX SOLR
CUTANEOUS | Status: DC | PRN
  Administered 2013-03-17 (×2): 5000 [IU] via TOPICAL

## 2013-03-17 MED ORDER — HYDROMORPHONE HCL PF 1 MG/ML IJ SOLN
0.2500 mg | INTRAMUSCULAR | Status: DC | PRN
  Administered 2013-03-17 (×3): 0.5 mg via INTRAVENOUS

## 2013-03-17 MED ORDER — VANCOMYCIN HCL IN DEXTROSE 750-5 MG/150ML-% IV SOLN
750.0000 mg | Freq: Two times a day (BID) | INTRAVENOUS | Status: DC
  Administered 2013-03-18 (×3): 750 mg via INTRAVENOUS
  Filled 2013-03-17 (×4): qty 150

## 2013-03-17 MED ORDER — LIDOCAINE HCL (CARDIAC) 20 MG/ML IV SOLN
INTRAVENOUS | Status: DC | PRN
  Administered 2013-03-17: 20 mg via INTRAVENOUS

## 2013-03-17 MED ORDER — POTASSIUM CHLORIDE 10 MEQ/100ML IV SOLN
INTRAVENOUS | Status: AC
  Filled 2013-03-17: qty 200

## 2013-03-17 MED ORDER — SODIUM CHLORIDE 0.9 % IV SOLN
INTRAVENOUS | Status: DC | PRN
  Administered 2013-03-17 (×2): via INTRAVENOUS

## 2013-03-17 MED ORDER — PHENYLEPHRINE HCL 10 MG/ML IJ SOLN
INTRAMUSCULAR | Status: DC | PRN
  Administered 2013-03-17 (×2): 120 ug via INTRAVENOUS

## 2013-03-17 MED ORDER — ONDANSETRON HCL 4 MG/2ML IJ SOLN
INTRAMUSCULAR | Status: DC | PRN
  Administered 2013-03-17: 4 mg via INTRAVENOUS

## 2013-03-17 MED ORDER — FENTANYL CITRATE 0.05 MG/ML IJ SOLN
INTRAMUSCULAR | Status: DC | PRN
  Administered 2013-03-17: 50 ug via INTRAVENOUS
  Administered 2013-03-17: 100 ug via INTRAVENOUS

## 2013-03-17 MED ORDER — HYDROMORPHONE HCL PF 1 MG/ML IJ SOLN
INTRAMUSCULAR | Status: AC
  Filled 2013-03-17: qty 1

## 2013-03-17 MED ORDER — NEOSTIGMINE METHYLSULFATE 1 MG/ML IJ SOLN
INTRAMUSCULAR | Status: DC | PRN
  Administered 2013-03-17: 4 mg via INTRAVENOUS
  Administered 2013-03-17: 1 mg via INTRAVENOUS

## 2013-03-17 MED ORDER — PROPOFOL 10 MG/ML IV BOLUS
INTRAVENOUS | Status: DC | PRN
  Administered 2013-03-17: 100 mg via INTRAVENOUS
  Administered 2013-03-17: 30 mg via INTRAVENOUS

## 2013-03-17 MED ORDER — POTASSIUM CHLORIDE 10 MEQ/100ML IV SOLN
10.0000 meq | INTRAVENOUS | Status: AC
  Administered 2013-03-17 – 2013-03-18 (×4): 10 meq via INTRAVENOUS
  Filled 2013-03-17 (×4): qty 100

## 2013-03-17 MED ORDER — ONDANSETRON HCL 4 MG/2ML IJ SOLN: 4.0000 mg | Freq: Once | INTRAMUSCULAR | Status: AC | PRN

## 2013-03-17 MED ORDER — OXYCODONE HCL 5 MG PO TABS
5.0000 mg | ORAL_TABLET | Freq: Once | ORAL | Status: AC | PRN
  Administered 2013-03-17: 5 mg via ORAL

## 2013-03-17 MED ORDER — GLYCOPYRROLATE 0.2 MG/ML IJ SOLN
INTRAMUSCULAR | Status: DC | PRN
  Administered 2013-03-17: .1 mg via INTRAVENOUS
  Administered 2013-03-17: .6 mg via INTRAVENOUS

## 2013-03-17 MED ORDER — ARTIFICIAL TEARS OP OINT
TOPICAL_OINTMENT | OPHTHALMIC | Status: DC | PRN
  Administered 2013-03-17: 1 via OPHTHALMIC

## 2013-03-17 MED ORDER — 0.9 % SODIUM CHLORIDE (POUR BTL) OPTIME
TOPICAL | Status: DC | PRN
  Administered 2013-03-17 (×2): 1000 mL

## 2013-03-17 MED ORDER — POTASSIUM CHLORIDE CRYS ER 20 MEQ PO TBCR
40.0000 meq | EXTENDED_RELEASE_TABLET | Freq: Every day | ORAL | Status: DC
  Administered 2013-03-18: 40 meq via ORAL
  Filled 2013-03-17 (×2): qty 2

## 2013-03-17 MED ORDER — OXYCODONE HCL 5 MG/5ML PO SOLN: 5.0000 mg | Freq: Once | ORAL | Status: AC | PRN

## 2013-03-17 MED ORDER — ROCURONIUM BROMIDE 100 MG/10ML IV SOLN
INTRAVENOUS | Status: DC | PRN
  Administered 2013-03-17: 40 mg via INTRAVENOUS

## 2013-03-17 MED ORDER — HEMOSTATIC AGENTS (NO CHARGE) OPTIME
TOPICAL | Status: DC | PRN
  Administered 2013-03-17: 1 via TOPICAL

## 2013-03-17 MED ORDER — OXYCODONE HCL 5 MG PO TABS
ORAL_TABLET | ORAL | Status: AC
  Filled 2013-03-17: qty 1

## 2013-03-17 SURGICAL SUPPLY — 57 items
APL SKNCLS STERI-STRIP NONHPOA (GAUZE/BANDAGES/DRESSINGS)
BENZOIN TINCTURE PRP APPL 2/3 (GAUZE/BANDAGES/DRESSINGS) IMPLANT
BLADE SURG ROTATE 9660 (MISCELLANEOUS) IMPLANT
CANISTER SUCT 3000ML (MISCELLANEOUS) ×2 IMPLANT
DRAPE LAPAROTOMY 100X72X124 (DRAPES) ×2 IMPLANT
DRAPE POUCH INSTRU U-SHP 10X18 (DRAPES) ×2 IMPLANT
DURAPREP 26ML APPLICATOR (WOUND CARE) IMPLANT
ELECT REM PT RETURN 9FT ADLT (ELECTROSURGICAL) ×2
ELECTRODE REM PT RTRN 9FT ADLT (ELECTROSURGICAL) ×1 IMPLANT
EVACUATOR 3/16  PVC DRAIN (DRAIN) ×1
EVACUATOR 3/16 PVC DRAIN (DRAIN) ×1 IMPLANT
GAUZE SPONGE 4X4 16PLY XRAY LF (GAUZE/BANDAGES/DRESSINGS) IMPLANT
GLOVE BIO SURGEON STRL SZ 6.5 (GLOVE) ×1 IMPLANT
GLOVE BIO SURGEON STRL SZ7 (GLOVE) IMPLANT
GLOVE BIO SURGEON STRL SZ7.5 (GLOVE) IMPLANT
GLOVE BIO SURGEON STRL SZ8 (GLOVE) IMPLANT
GLOVE BIO SURGEON STRL SZ8.5 (GLOVE) IMPLANT
GLOVE BIOGEL M 8.0 STRL (GLOVE) ×2 IMPLANT
GLOVE ECLIPSE 6.5 STRL STRAW (GLOVE) IMPLANT
GLOVE ECLIPSE 7.0 STRL STRAW (GLOVE) IMPLANT
GLOVE ECLIPSE 7.5 STRL STRAW (GLOVE) IMPLANT
GLOVE ECLIPSE 8.0 STRL XLNG CF (GLOVE) IMPLANT
GLOVE ECLIPSE 8.5 STRL (GLOVE) IMPLANT
GLOVE EXAM NITRILE LRG STRL (GLOVE) IMPLANT
GLOVE EXAM NITRILE MD LF STRL (GLOVE) IMPLANT
GLOVE EXAM NITRILE XL STR (GLOVE) IMPLANT
GLOVE EXAM NITRILE XS STR PU (GLOVE) IMPLANT
GLOVE INDICATOR 6.5 STRL GRN (GLOVE) IMPLANT
GLOVE INDICATOR 7.0 STRL GRN (GLOVE) ×1 IMPLANT
GLOVE INDICATOR 7.5 STRL GRN (GLOVE) IMPLANT
GLOVE INDICATOR 8.0 STRL GRN (GLOVE) IMPLANT
GLOVE INDICATOR 8.5 STRL (GLOVE) IMPLANT
GLOVE OPTIFIT SS 8.0 STRL (GLOVE) IMPLANT
GLOVE SURG SS PI 6.5 STRL IVOR (GLOVE) IMPLANT
GOWN BRE IMP SLV AUR LG STRL (GOWN DISPOSABLE) ×2 IMPLANT
GOWN BRE IMP SLV AUR XL STRL (GOWN DISPOSABLE) IMPLANT
GOWN STRL REIN 2XL LVL4 (GOWN DISPOSABLE) IMPLANT
KIT BASIN OR (CUSTOM PROCEDURE TRAY) ×2 IMPLANT
KIT ROOM TURNOVER OR (KITS) ×2 IMPLANT
NS IRRIG 1000ML POUR BTL (IV SOLUTION) ×3 IMPLANT
PACK LAMINECTOMY NEURO (CUSTOM PROCEDURE TRAY) ×2 IMPLANT
PAD ARMBOARD 7.5X6 YLW CONV (MISCELLANEOUS) ×6 IMPLANT
SPONGE GAUZE 4X4 12PLY (GAUZE/BANDAGES/DRESSINGS) ×2 IMPLANT
SPONGE SURGIFOAM ABS GEL SZ50 (HEMOSTASIS) ×2 IMPLANT
STRIP CLOSURE SKIN 1/2X4 (GAUZE/BANDAGES/DRESSINGS) IMPLANT
SUT PROLENE 0 CT 1 30 (SUTURE) ×2 IMPLANT
SUT VIC AB 0 CT1 18XCR BRD8 (SUTURE) ×1 IMPLANT
SUT VIC AB 0 CT1 8-18 (SUTURE) ×2
SUT VIC AB 2-0 CP2 18 (SUTURE) ×2 IMPLANT
SUT VIC AB 3-0 SH 8-18 (SUTURE) ×2 IMPLANT
SWAB CULTURE LIQ STUART DBL (MISCELLANEOUS) ×2 IMPLANT
SYR 20ML ECCENTRIC (SYRINGE) ×2 IMPLANT
TAPE CLOTH SURG 4X10 WHT LF (GAUZE/BANDAGES/DRESSINGS) ×1 IMPLANT
TOWEL OR 17X24 6PK STRL BLUE (TOWEL DISPOSABLE) ×2 IMPLANT
TOWEL OR 17X26 10 PK STRL BLUE (TOWEL DISPOSABLE) ×2 IMPLANT
TUBE ANAEROBIC SPECIMEN COL (MISCELLANEOUS) ×2 IMPLANT
WATER STERILE IRR 1000ML POUR (IV SOLUTION) ×2 IMPLANT

## 2013-03-17 NOTE — Evaluation (Signed)
Occupational Therapy Evaluation Patient Details Name: [name] MRN: [id number] DOB: 1945/04/10 Today's Date: 03/17/2013 Time: 3825-0539 OT Time Calculation (min): 43 min  OT Assessment / Plan / Recommendation History of present illness Pt is a 67 yo male who has spinal surgery 11 days ago, now presents for symptoms of post operative infection.   Clinical Impression   Pt presents with confusion related to events of last 2 weeks and to time and date.  He is able to state his back precautions, but does not always generalize in mobility and ADL.  Overall, he is at a min guard to supervision level in ADL and mobility.  Will follow, likely to be able to return home with [place] [place] upon d/c. Of note, pt unaware of urinary incontinence and 02 sats decreased to upper 80s with ambulation on RA with pt spitting up phlegm, RN is aware.    OT Assessment  Patient needs continued OT Services    Follow Up Recommendations  Home health OT;Supervision/Assistance - 24 hour    Barriers to Discharge      Equipment Recommendations       Recommendations for Other Services    Frequency  Min 2X/week    Precautions / Restrictions Precautions Precautions: Back;Fall Type of Shoulder Precautions: Pt able to state 3/3 precautions. Precaution Booklet Issued: No Precaution Comments: cues for precautions during session. Required Braces or Orthoses: Spinal Brace Spinal Brace: Lumbar corset;Applied in standing position Restrictions Weight Bearing Restrictions: No   Pertinent Vitals/Pain 88-89% on RA, no c/o pain    ADL  Eating/Feeding: Independent Where Assessed - Eating/Feeding: Chair Grooming: Wash/dry hands;Wash/dry face;Teeth care;Min guard;Set up Where Assessed - Grooming: Unsupported standing;Supported sitting Upper Body Bathing: Supervision/safety Where Assessed - Upper Body Bathing: Unsupported sitting Lower Body Bathing: Min guard Where Assessed - Lower Body Bathing: Unsupported  sitting;Supported sit to stand Upper Body Dressing: Set up Where Assessed - Upper Body Dressing: Unsupported sitting Lower Body Dressing: Min guard Where Assessed - Lower Body Dressing: Unsupported sitting;Supported sit to stand Toilet Transfer: Min Psychiatric nurse Method: Sit to Loss adjuster, chartered: Comfort height toilet Toileting - Clothing Manipulation and Hygiene: Moderate assistance Where Assessed - Toileting Clothing Manipulation and Hygiene: Standing Equipment Used: Gait belt;Back brace;Rolling walker Transfers/Ambulation Related to ADLs: min guard with RW, walks fast, verbal cues to avoid twisting  ADL Comments: Pt able to cross his foot over his opposite knee to donn and doff socks.    OT Diagnosis: Generalized weakness;Cognitive deficits  OT Problem List: Decreased strength;Decreased activity tolerance;Impaired balance (sitting and/or standing);Decreased cognition;Decreased safety awareness;Cardiopulmonary status limiting activity;Obesity OT Treatment Interventions: Self-care/ADL training;DME and/or AE instruction;Patient/family education;Cognitive remediation/compensation   OT Goals(Current goals can be found in the care plan section) Acute Rehab OT Goals Patient Stated Goal: to go to rehab OT Goal Formulation: With patient Time For Goal Achievement: 03/24/13 Potential to Achieve Goals: Good ADL Goals Pt Will Perform Grooming: with supervision;standing Pt Will Perform Lower Body Bathing: with supervision;sit to/from stand Pt Will Perform Lower Body Dressing: with supervision;sit to/from stand Pt Will Transfer to Toilet: with supervision;ambulating Pt Will Perform Toileting - Clothing Manipulation and hygiene: with supervision;sit to/from stand Pt Will Perform Tub/Shower Transfer: with supervision;ambulating;3 in 1 Additional ADL Goal #1: Pt will generalize back precautions independently.  Visit Information  Last OT Received On: 03/17/13 Assistance  Needed: +1 PT/OT/SLP Co-Evaluation/Treatment: Yes OT goals addressed during session: ADL's and self-care History of Present Illness: Pt is a 67 yo male who has spinal surgery 11  days ago, now presents for symptoms of post operative infection.       Prior [place] expects to be discharged to:: Private residence (Simultaneous filing. User may not have seen previous data.) Living Arrangements: Spouse/significant other Available Help at Discharge: Family;Available PRN/intermittently Type of Home: House Home Access: Stairs to enter [employer] of Steps: 5 Entrance Stairs-Rails: Right Home Layout: Two level;Able to live on main level with bedroom/bathroom Home Equipment: Gilford Rile - 2 wheels;Cane - single point Additional Comments: pt does not usually use an assistive device but recently has used a cane a little bit. He has fallen a couple of times when getting up at night, no serious injuries.   Lives With: Significant other Prior Function Level of Independence: Independent (prior to surgery) Communication Communication: No difficulties Dominant Hand: Right         Vision/Perception Vision - History Patient Visual Report: No change from baseline   Cognition  Cognition Arousal/Alertness: Awake/alert Behavior During Therapy: WFL for tasks assessed/performed Overall Cognitive Status: Impaired/Different from baseline Area of Impairment: Orientation;Memory Orientation Level: Place;Time;Situation Memory: Decreased short-term memory General Comments: Pt unsure which facility he is currently at, states that he has not left that he just moved to the ICU, unable to provide accurate account of events since previous admission. (Pt was seen by this therapist last wk prior to discharge to [facility], patient was a direct admit to floor. Patient also not oriented to time of day, advised patient of lights on, blinds open day time precaution to assist  with reorientation.     Extremity/Trunk Assessment Upper Extremity Assessment Upper Extremity Assessment: RUE deficits/detail;LUE deficits/detail RUE Deficits / Details: pre-existing deficits due to RA and multiple surgeries LUE Deficits / Details: joint changes due to RA, recent shoulder sx, can get hand to top of head Lower Extremity Assessment Lower Extremity Assessment: Overall WFL for tasks assessed     Mobility Bed Mobility Bed Mobility: Rolling Left;Sit to Sidelying Right;Scooting to [place] Left: 5: Supervision Sit to Sidelying Right: 5: Supervision Scooting to [facility]: 5: Supervision Details for Bed Mobility Assistance: VCs for compliance and sequencing Transfers Sit to Stand: 4: Min guard Stand to Sit: 4: Min guard     Exercise     Balance     End of Session OT - End of Session Activity Tolerance: Patient tolerated treatment well Patient left: in chair;with nursing/sitter in room Nurse Communication: Mobility status (bloody drainage on pad)  GO     Malka So 03/17/2013, 11:15 AM [phone]

## 2013-03-17 NOTE — Progress Notes (Deleted)
[facility] Medical Consultation  [name] OEU:[id number] DOB: Jul 15, 1945 DOA: 03/16/2013 PCP: John Hare, MD   Requesting physician: [name] Date of consultation: 03/17/13 Reason for consultation: hyponatremia  Impression/Recommendations  Hyponatremia, hypovolemic. Sodiums last admission normal Clinically, dehydrated, and BUN, creatinine higher than baseline. Also on thiazide and lasix as outpatient which are contributing.  Agree with stopping diuretics.  Saline currently running at 75 cc/hr.  Will increase. Should correct with saline when euvolemic.  Repeat BMET pending.  Will also check TSH.    DYSLIPIDEMIA   PSEUDOGOUT   HYPERTENSION   CAD   VENOUS INSUFFICIENCY, LEGS   Rheumatoid arthritis(714.0)   HEMOCHROMATOSIS, HX OF   Atrial fibrillation   Dehydration   GERD (gastroesophageal reflux disease) Wound infection: on vancomycin and for revision tonight   Thank you for this consultation.  [facility] to follow.   Chief Complaint: fever  HPI:  67 y.o. White male admitted for fever, wound infection.  Had lumbar fusion 11 days ago.  BMET yesterday showed sodium 120. Normal a week ago.  Hospitalists consulted today for hyponatremia.  Pt reports poor appetite, intermittent vomiting since surgery.  BUN 45. Creatinine 1.28, both normal a week ago.  Also on thiazide and loop diuretics as outpatient, which have been discontinued.  Repeat BMET pending.  Wife also reports confusion.  Pt c/o dry mouth and mouth pain.  Some dysuria.  Denies h/o CHF.  Review of Systems:   Systems reviewed. As above, otherwise negative.  Past Medical History  Diagnosis Date  . Hx of colonic polyps   . Diverticulosis   . Hypertension   . Osteoarthritis   . 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 CAD (coronary artery disease)     minimal coronary plaque in the LAD and right coronary system. PCI of a 95% obtuse marginal lesion w/  resultant spiral dissection requiring drug-eluting stent placement. 7-06. Last nuclear stress 11-17-06 fixed anterior/ inferior defect, no inducible ischemia, EF 81%  . Allergic rhinitis   . Hx of colonoscopy   . RA (rheumatoid arthritis)   . COPD (chronic obstructive pulmonary disease)   . PONV (postoperative nausea and vomiting)   . Dysrhythmia 01-24-12    past hx. A.Fib x1 episode-responded to med.  . Depression   . Anxiety     pt. feels its related to the medicine that he takes   . GERD (gastroesophageal reflux disease)    Past Surgical History  Procedure Laterality Date  . Appendectomy    . Inguinal hernia repair  2008    Right, left remotely  . Total knee arthroplasty      right knee partial 2002  . Tonsillectomy    . Shoulder preplacement  2008    left, partial  . Ptca w/ coated stent lad      Cx secondary to tear  . Foot surgery  11-08    for removal of bone spurs- right foot  . Reconstruction of ankle  6-09    Right- [facility]  . L4-5 diskectomy w/ fusion, cage placement and rods  12-10    [facility]  . Partial knee arthroplasty      left  . Total shoulder replacement      right  . Joint replacement    . Cataract extraction, bilateral  01-24-12    Bilateral  . Orif shoulder fracture  02/06/2012    Procedure: OPEN REDUCTION INTERNAL FIXATION (ORIF) SHOULDER  FRACTURE;  Surgeon: John Sells, MD;  Location: [facility];  Service: Orthopedics;  Laterality: Left;  ORIF of a Left Shoulder Fracture with  Iliac Crest Bone Graft aspiration   . Harvest bone graft  02/06/2012    Procedure: HARVEST ILIAC BONE GRAFT;  Surgeon: John Sells, MD;  Location: [facility];  Service: Orthopedics;;  bone marrow aspirqation   . Hardware removal  03/09/2012    Procedure: HARDWARE REMOVAL;  Surgeon: John Sells, MD;  Location: [facility];  Service: Orthopedics;  Laterality: Left;  Hardware Removal from Left Shoulder  . Cardiac catheterization  2006     coronary stents- no problems  . Shoulder arthroscopy  2013  . Carpal tunnel release      R hand  . Ankle surgery Right 2008    at [facility], replacement   . Dg toes*r*      toe surgery- 07/2012  . Posterior lumbar fusion 4 level N/A 03/02/2013    Procedure: Lumbar One to Sacral One Posterior lumbar interbody fusion;  Surgeon: Floyce Stakes, MD;  Location: [facility];  Service: Neurosurgery;  Laterality: N/A;  L1 to S1 Posterior lumbar interbody fusion   Social History:  reports that he has never smoked. He has never used smokeless tobacco. He reports that he does not drink alcohol or use illicit drugs.  Allergies  Allergen Reactions  . Morphine And Related Shortness Of Breath, Nausea And Vomiting and Swelling  . Penicillins Hives, Shortness Of Breath and Rash    REACTION: hives, breathing problems  . Percocet [Oxycodone-Acetaminophen] Anxiety   Family History  Problem Relation Age of Onset  . Uterine cancer Mother     survivor  . Macular degeneration Mother   . Other Mother     ankle edema  . Lung cancer Mother   . Cancer Mother     ovarian, lung  . Coronary artery disease Father   . Hypertension Father   . Prostate cancer Father   . Colon polyps Father   . Cancer Father     prostate  . Thyroid disease Sister   . Heart attack Brother   . Hyperlipidemia Brother   . Other Brother     Schizophrenic  . Diabetes Neg Hx   . Colon cancer Neg Hx   . Esophageal cancer Neg Hx   . Rectal cancer Neg Hx   . Stomach cancer Neg Hx   . Coronary artery disease Maternal Aunt   . Heart attack Maternal Aunt   . Rheum arthritis Sister   . Hemochromatosis Sister     Prior to Admission medications   Medication Sig Start Date End Date Taking? Authorizing Provider  Aclidinium Bromide (TUDORZA PRESSAIR) 400 MCG/ACT AEPB Inhale 1 puff into the lungs 2 (two) times daily. 01/23/12  Yes Neena Rhymes, MD  adalimumab (HUMIRA) 40 MG/0.8ML injection Inject 40 mg into the skin every 14  (fourteen) days.   Yes Historical Provider, MD  ALPRAZolam Duanne Moron) 0.5 MG tablet Take 1 tablet (0.5 mg total) by mouth 3 (three) times daily as needed for anxiety or sleep. 03/11/13  Yes Tiffany L Reed, DO  aspirin (ASPIRIN EC) 81 MG EC tablet Take 81 mg by mouth every morning.    Yes Historical Provider, MD  celecoxib (CELEBREX) 200 MG capsule Take 200 mg by mouth 2 (two) times daily.   Yes Historical Provider, MD  cholecalciferol (VITAMIN D) 1000 UNITS tablet Take 1 tablet (1,000 Units total) by mouth daily.  06/24/12 06/24/13 Yes Aleksei V Plotnikov, MD  COLCRYS 0.6 MG tablet TAKE 1 TABLET (0.6 MG TOTAL) BY MOUTH 2 (TWO) TIMES DAILY. 02/24/13  Yes Neena Rhymes, MD  FLUoxetine (PROZAC) 40 MG capsule Take 40 mg by mouth 2 (two) times daily.    Yes Historical Provider, MD  fluticasone (FLONASE) 50 MCG/ACT nasal spray Place 2 sprays into both nostrils daily.   Yes Historical Provider, MD  furosemide (LASIX) 20 MG tablet Take 20 mg by mouth daily.   Yes Historical Provider, MD  gabapentin (NEURONTIN) 300 MG capsule Take 300-600 mg by mouth 4 (four) times daily. 600 mg every morning, 300 mg at noon, 600 mg at bedtime, and 300 mg at 1 AM   Yes Historical Provider, MD  hydrochlorothiazide (HYDRODIURIL) 25 MG tablet Take 25 mg by mouth daily before breakfast.    Yes Historical Provider, MD  HYDROmorphone (DILAUDID) 2 MG tablet Take one tablet by mouth every four hours as needed 03/11/13  Yes Tiffany L Reed, DO  losartan (COZAAR) 50 MG tablet TAKE 1 TABLET BY MOUTH ONCE A DAY 02/28/13  Yes Neena Rhymes, MD  Melatonin ([employer] MELATONIN) 5 MG TABS Take 5 mg by mouth at bedtime.    Yes Historical Provider, MD  OxyCODONE (OXYCONTIN) 20 mg T12A 12 hr tablet Take 1 tablet (20 mg total) by mouth every 12 (twelve) hours. 03/11/13  Yes Tiffany L Reed, DO  pantoprazole (PROTONIX) 40 MG tablet Take 40 mg by mouth 2 (two) times daily.  07/29/12  Yes Neena Rhymes, MD  Polyethyl Glycol-Propyl Glycol (SYSTANE ULTRA)  0.4-0.3 % SOLN Apply 2 drops to eye 2 (two) times daily as needed (dry eyes).    Yes Historical Provider, MD  potassium chloride SA (K-DUR,KLOR-CON) 20 MEQ tablet Take 20 mEq by mouth daily.   Yes Historical Provider, MD  predniSONE (DELTASONE) 5 MG tablet Take 5 mg by mouth every evening.    Yes Historical Provider, MD  PRESCRIPTION MEDICATION Inject as directed once.   Yes Historical Provider, MD  simvastatin (ZOCOR) 40 MG tablet Take 40 mg by mouth daily. At lunch   Yes Historical Provider, MD  traZODone (DESYREL) 100 MG tablet Take 100 mg by mouth at bedtime. Take just before bedtime   Yes Historical Provider, MD  Elastic Bandages & Supports (ABDOMINAL BINDER/ELASTIC MED) MISC 1 Device by Does not apply route as needed. 08/06/12   Webb Silversmith, NP   Physical Exam: Blood pressure 92/56, pulse 105, temperature 99.4 F (37.4 C), temperature source Oral, resp. rate 18, height 5' 6.14" (1.68 m), weight 71.8 kg (158 lb 4.6 oz), SpO2 92.00%. Filed Vitals:   03/17/13 1749  BP: 92/56  Pulse: 105  Temp: 99.4 F (37.4 C)  Resp: 18   BP 92/56  Pulse 105  Temp(Src) 99.4 F (37.4 C) (Oral)  Resp 18  Ht 5' 6.14" (1.68 m)  Wt 71.8 kg (158 lb 4.6 oz)  BMI 25.44 kg/m2  SpO2 92%  General Appearance:    Alert, cooperative, no distress, appears stated age. Occasionally confused  Head:    Normocephalic, without obvious abnormality, atraumatic  Eyes:    PERRL, conjunctiva/corneas clear, EOM's intact, fundi    benign, both eyes          Nose:   Nares normal, septum midline, mucosa normal, no drainage   or sinus tenderness  Throat:   dry mucous membranes, with angular chelitis. No ulcerations  Neck:   Supple, symmetrical, trachea midline, no adenopathy;  thyroid:  No enlargement/tenderness/nodules; no carotid   bruit or JVD  Back:     Symmetric, no curvature, ROM normal, no CVA tenderness  Lungs:     Clear to auscultation bilaterally, respirations unlabored  Chest wall:    No tenderness or  deformity  Heart:    Regular rate and rhythm, S1 and S2 normal, no murmur, rub   or gallop  Abdomen:     Soft, non-tender, bowel sounds active all four quadrants,    no masses, no organomegaly  Genitalia:   deferred  Rectal:   deferred  Extremities:   Extremities normal, atraumatic, no cyanosis or edema  Pulses:   2+ and symmetric all extremities  Skin:   Skin color, texture, turgor normal, no rashes or lesions  Lymph nodes:   Cervical, supraclavicular, and axillary nodes normal  Neurologic:   CNII-XII intact. Normal strength, sensation and reflexes      throughout    Labs on Admission:  Basic Metabolic Panel:  Recent Labs Lab 03/16/13 1915  NA 120*  K 4.3  CL 84*  CO2 22  GLUCOSE 195*  BUN 45*  CREATININE 1.28  CALCIUM 9.5   Liver Function Tests: No results found for this basename: AST, ALT, ALKPHOS, BILITOT, PROT, ALBUMIN,  in the last 168 hours No results found for this basename: LIPASE, AMYLASE,  in the last 168 hours No results found for this basename: AMMONIA,  in the last 168 hours CBC:  Recent Labs Lab 03/16/13 1915  WBC 15.9*  NEUTROABS 13.4*  HGB 10.2*  HCT 29.2*  MCV 86.6  PLT 354   Cardiac Enzymes: No results found for this basename: CKTOTAL, CKMB, CKMBINDEX, TROPONINI,  in the last 168 hours BNP: No components found with this basename: POCBNP,  CBG: No results found for this basename: GLUCAP,  in the last 168 hours  Radiological Exams on Admission: Dg Chest 2 View  03/17/2013   CLINICAL DATA:  Fever, hypotension  EXAM: CHEST  2 VIEW  COMPARISON:  07/14/2012, CT 08/11/2012  FINDINGS: Right cardiophrenic angle scarring is reidentified with stable right hemidiaphragmatic elevation. Bilateral humeral arthroplasty and lumbar fusion hardware noted. Rightward curvature of the thoracic spine centered at its mid portion is reidentified. Heart size is normal. No new pulmonary opacity. No pleural effusion.  IMPRESSION: Chronic right basilar scarring.  No new  finding.   Electronically Signed   By: Conchita Paris M.D.   On: 03/17/2013 12:59    Time spent: 60 minutes  [facility] Pager [phone]  If 7PM-7AM, please contact night-coverage [email or web address] Password [email or web address] 03/17/2013, 8:12 PM

## 2013-03-17 NOTE — Progress Notes (Signed)
Patient ID: [name], male   DOB: Apr 22, 1945, 68 y.o.   MRN: [id number] No fever, increase of wbc. Draining from wound. Sputum yellowish. Plan npo, torevise wound tonite

## 2013-03-17 NOTE — Anesthesia Preprocedure Evaluation (Addendum)
Anesthesia Evaluation  Patient identified by MRN, date of birth, ID band Patient awake    Reviewed: Allergy & Precautions, H&P , NPO status , Patient's Chart, lab work & pertinent test results  History of Anesthesia Complications (+) PONV and history of anesthetic complications  Airway Mallampati: II TM Distance: >3 FB Neck ROM: Full    Dental  (+) Teeth Intact and Dental Advisory Given   Pulmonary COPD breath sounds clear to auscultation        Cardiovascular hypertension, Rhythm:Regular Rate:Normal     Neuro/Psych  Neuromuscular disease    GI/Hepatic   Endo/Other    Renal/GU      Musculoskeletal  (+) Arthritis -,   Abdominal   Peds  Hematology  (+) anemia ,   Anesthesia Other Findings   Reproductive/Obstetrics                       Anesthesia Physical Anesthesia Plan  ASA: III  Anesthesia Plan: General   Post-op Pain Management:    Induction: Intravenous  Airway Management Planned: Oral ETT  Additional Equipment:   Intra-op Plan:   Post-operative Plan: Extubation in OR  Informed Consent: I have reviewed the patients History and Physical, chart, labs and discussed the procedure including the risks, benefits and alternatives for the proposed anesthesia with the patient or authorized representative who has indicated his/her understanding and acceptance.   Dental advisory given  Plan Discussed with: CRNA and Anesthesiologist  Anesthesia Plan Comments: (Post-op lumbar wound infection S/P lumbar fusion 11/25 Rheumatoid arthritis on prednisone and humira Hyponatremia secondary to post-op dehydration CAD S/P PTCA 2006, (-) nuclear stress test 2012 H/O paroxysmal Afib 2012, EF 60% on TEE Hypokalemia  Plan GA with oral ETT)       Anesthesia Quick Evaluation

## 2013-03-17 NOTE — Anesthesia Postprocedure Evaluation (Signed)
  Anesthesia Post-op Note  Patient: [name]  Procedure(s) Performed: Procedure(s) with comments: Incision and drainage of superficial lumbar wound (N/A) - Incision and drainage of superficial lumbar wound  Patient Location: PACU  Anesthesia Type:General  Level of Consciousness: awake, alert  and oriented  Airway and Oxygen Therapy: Patient Spontanous Breathing and Patient connected to nasal cannula oxygen  Post-op Pain: mild  Post-op Assessment: Post-op Vital signs reviewed, Patient's Cardiovascular Status Stable, Respiratory Function Stable, Patent Airway and Pain level controlled  Post-op Vital Signs: stable  Complications: No apparent anesthesia complications

## 2013-03-17 NOTE — Anesthesia Procedure Notes (Signed)
Procedure Name: Intubation Date/Time: 03/17/2013 9:45 PM Performed by: Julian Reil Pre-anesthesia Checklist: Patient identified, Emergency Drugs available, Suction available and Patient being monitored Patient Re-evaluated:Patient Re-evaluated prior to inductionOxygen Delivery Method: Circle system utilized Preoxygenation: Pre-oxygenation with 100% oxygen Intubation Type: IV induction Ventilation: Mask ventilation without difficulty Laryngoscope Size: Mac and 4 Grade View: Grade I Tube type: Oral Tube size: 7.5 mm Number of attempts: 1 Airway Equipment and Method: Stylet Placement Confirmation: ETT inserted through vocal cords under direct vision,  positive ETCO2 and breath sounds checked- equal and bilateral Secured at: 22 cm Tube secured with: Tape Dental Injury: Teeth and Oropharynx as per pre-operative assessment

## 2013-03-17 NOTE — Transfer of Care (Signed)
Immediate Anesthesia Transfer of Care Note  Patient: [name]  Procedure(s) Performed: Procedure(s) with comments: Incision and drainage of superficial lumbar wound (N/A) - Incision and drainage of superficial lumbar wound  Patient Location: PACU  Anesthesia Type:General  Level of Consciousness: awake, alert  and oriented  Airway & Oxygen Therapy: Patient Spontanous Breathing and Patient connected to face mask oxygen  Post-op Assessment: Report given to PACU RN, Post -op Vital signs reviewed and stable and Patient moving all extremities X 4  Post vital signs: Reviewed and stable  Complications: No apparent anesthesia complications

## 2013-03-17 NOTE — Progress Notes (Signed)
Pt wound draining, dsg changed; pt coughs out thick yellow sputum. MD notified and will continue monitor. Pennie Banter Declyn Offield RN

## 2013-03-17 NOTE — Progress Notes (Signed)
MD [facility] called to order 3% NS for pt but order not completed d/t fluid only to be adm via central line and pt has peripheral line only. Pharmacist consulted and clarifies not to adm via peripheral but central ONLY. MD contacted but informed MD in OR and will return call back. Waiting on MD return phone call. Pt A&O x4; in room and sleeping comfortably. Will continue to monitor pt quietly. Pennie Banter Chauntel Windsor RN.

## 2013-03-17 NOTE — Evaluation (Signed)
Physical Therapy Evaluation Patient Details Name: [name] MRN: [id number] DOB: 1945/12/26 Today's Date: 03/17/2013 Time: 3704-8889 PT Time Calculation (min): 41 min  PT Assessment / Plan / Recommendation History of Present Illness  Pt is a 67 yo male who has spinal surgery 11 days ago, now presents for symptoms of post operative infection.  Clinical Impression  Patient demonstrates deficits in functional mobility as indicated below. Pt will benefit from skilled PT to address deficits and maximize function. Will continue to see as indicated and progress activity as tolerated. Pt is known to this therapist from last wks admission. Pt very disoriented to events of past 2wks, as well as time of day and current situation. Recommended techniques: blinds open, lights on during the day to assist with reorientation.    PT Assessment  Patient needs continued PT services    Follow Up Recommendations  Home health PT;Supervision - Intermittent    Does the patient have the potential to tolerate intense rehabilitation      Barriers to Discharge        Equipment Recommendations  None recommended by PT    Recommendations for Other Services     Frequency Min 5X/week    Precautions / Restrictions Precautions Precautions: Back Type of Shoulder Precautions: Pt able to state 3/3 precautions. Precaution Booklet Issued: No Precaution Comments: cues for precautions during session. Required Braces or Orthoses: Spinal Brace Spinal Brace: Lumbar corset;Applied in standing position   Pertinent Vitals/Pain No pain rated at this time      Mobility  Bed Mobility Bed Mobility: Rolling Left;Sit to Sidelying Right;Scooting to [facility] Rolling Left: 5: Supervision Sit to Sidelying Right: 5: Supervision Scooting to [facility]: 5: Supervision Details for Bed Mobility Assistance: VCs for compliance and sequencing Transfers Transfers: Sit to Stand;Stand to Sit Sit to Stand: 4: Min guard Stand to Sit: 4:  Min guard Ambulation/Gait Ambulation/Gait Assistance: 4: Min guard Ambulation Distance (Feet): 110 Feet Assistive device: Rolling walker Ambulation/Gait Assistance Details: oxygen desaturation with ambulation 89% Gait Pattern: Step-through pattern;Decreased stride length Gait velocity: VCs for precaution compliance and safety    Exercises     PT Diagnosis: Difficulty walking;Generalized weakness;Acute pain  PT Problem List: Decreased strength;Decreased activity tolerance;Decreased mobility PT Treatment Interventions: DME instruction;Gait training;Stair training;Functional mobility training;Therapeutic activities;Therapeutic exercise;Cognitive remediation;Patient/family education     PT Goals(Current goals can be found in the care plan section)    Visit Information  Last PT Received On: 03/17/13 Assistance Needed: +1 History of Present Illness: Pt is a 67 yo male who has spinal surgery 11 days ago, now presents for symptoms of post operative infection.       Prior [place] expects to be discharged to:: Private residence (Simultaneous filing. User may not have seen previous data.) Living Arrangements: Spouse/significant other Available Help at Discharge: Family;Available PRN/intermittently Type of Home: House Home Access: Stairs to enter [employer] of Steps: 5 Entrance Stairs-Rails: Right Home Layout: Two level;Able to live on main level with bedroom/bathroom Home Equipment: Gilford Rile - 2 wheels;Cane - single point Additional Comments: pt does not usually use an assistive device but recently has used a cane a little bit. He has fallen a couple of times when getting up at night, no serious injuries.   Lives With: Significant other Communication Communication: No difficulties Dominant Hand: Right    Cognition  Cognition Arousal/Alertness: Awake/alert Behavior During Therapy: WFL for tasks assessed/performed Overall Cognitive Status:  Impaired/Different from baseline Area of Impairment: Orientation;Memory Orientation Level: Place;Time;Situation Memory:  Decreased short-term memory General Comments: Pt unsure which facility he is currently at, states that he has not left that he just moved to the ICU, unable to provide accurate account of events since previous admission. (Pt was seen by this therapist last wk prior to discharge to [facility], patient was a direct admit to floor. Patient also not oriented to time of day, advised patient of lights on, blinds open day time precaution to assist with reorientation.     Extremity/Trunk Assessment Lower Extremity Assessment Lower Extremity Assessment: Overall WFL for tasks assessed   Balance    End of Session PT - End of Session Equipment Utilized During Treatment: Gait belt;Back brace Activity Tolerance: Patient tolerated treatment well Patient left: in chair;with call bell/phone within reach Nurse Communication: Mobility status  GP     [name] 03/17/2013, 11:06 AM [name], PT DPT  [phone]

## 2013-03-17 NOTE — Progress Notes (Signed)
Patient found to be "fuzzy" in his thoughts and slow to respond. This is not different from original assessment. However, he is able to answer orientation questions. Patient has frequent urination with intermittent episodes of incontinence. I am not sure that the patient understands that he is having incontinent episodes as he attempts to get OOB to use restroom afterwards despite bed alarm and being told to call. Patient very unsteady on his feet, with a wobbling gate. Patient moves quickly and appears to lose balance easily. When asked if he has had a fall within the last 6 months, pt denies. Bed alarm and other safety measures in place. Patient is also noted to be forcefully coughing up a blood tinged sputum. Will report off to am nurse.

## 2013-03-17 NOTE — Progress Notes (Signed)
Pt has red area around anus.  Skin looks intact

## 2013-03-17 NOTE — Progress Notes (Signed)
Op note [phone]

## 2013-03-18 ENCOUNTER — Encounter (HOSPITAL_COMMUNITY): Payer: Self-pay | Admitting: Neurosurgery

## 2013-03-18 DIAGNOSIS — M069 Rheumatoid arthritis, unspecified: Secondary | ICD-10-CM

## 2013-03-18 DIAGNOSIS — E876 Hypokalemia: Secondary | ICD-10-CM | POA: Diagnosis not present

## 2013-03-18 DIAGNOSIS — F05 Delirium due to known physiological condition: Secondary | ICD-10-CM

## 2013-03-18 LAB — URINALYSIS, ROUTINE W REFLEX MICROSCOPIC
Bilirubin Urine: NEGATIVE
Glucose, UA: NEGATIVE mg/dL
Hgb urine dipstick: NEGATIVE
Ketones, ur: NEGATIVE mg/dL
Leukocytes, UA: NEGATIVE
Nitrite: NEGATIVE
Protein, ur: NEGATIVE mg/dL
Specific Gravity, Urine: 1.013 (ref 1.005–1.030)
Urobilinogen, UA: 0.2 mg/dL (ref 0.0–1.0)
pH: 6 (ref 5.0–8.0)

## 2013-03-18 LAB — BASIC METABOLIC PANEL
BUN: 13 mg/dL (ref 6–23)
CO2: 29 mEq/L (ref 19–32)
Calcium: 7.3 mg/dL — ABNORMAL LOW (ref 8.4–10.5)
Chloride: 95 mEq/L — ABNORMAL LOW (ref 96–112)
Creatinine, Ser: 0.53 mg/dL (ref 0.50–1.35)
GFR calc Af Amer: >90 mL/min (ref >90)
GFR calc non Af Amer: >90 mL/min (ref >90)
Glucose, Bld: 102 mg/dL — ABNORMAL HIGH (ref 70–99)
Potassium: 2.9 mEq/L — ABNORMAL LOW (ref 3.5–5.1)
Sodium: 129 mEq/L — ABNORMAL LOW (ref 135–145)

## 2013-03-18 LAB — TSH: TSH: 0.907 u[IU]/mL (ref 0.350–4.500)

## 2013-03-18 LAB — OSMOLALITY: Osmolality: 262 mOsm/kg — ABNORMAL LOW (ref 275–300)

## 2013-03-18 LAB — OSMOLALITY, URINE: Osmolality, Ur: 348 mOsm/kg — ABNORMAL LOW (ref 390–1090)

## 2013-03-18 MED ORDER — DIPHENHYDRAMINE HCL 12.5 MG/5ML PO ELIX: 12.5000 mg | ORAL_SOLUTION | Freq: Four times a day (QID) | ORAL | Status: DC | PRN

## 2013-03-18 MED ORDER — DIPHENHYDRAMINE HCL 50 MG/ML IJ SOLN: 12.5000 mg | Freq: Four times a day (QID) | INTRAMUSCULAR | Status: DC | PRN

## 2013-03-18 MED ORDER — NALOXONE HCL 0.4 MG/ML IJ SOLN: 0.4000 mg | INTRAMUSCULAR | Status: DC | PRN

## 2013-03-18 MED ORDER — HYDROMORPHONE 0.3 MG/ML IV SOLN
INTRAVENOUS | Status: DC
  Administered 2013-03-18: 0.3 mg via INTRAVENOUS
  Administered 2013-03-18: 7.5 mg via INTRAVENOUS
  Administered 2013-03-18: 09:00:00 via INTRAVENOUS
  Administered 2013-03-18: 0.9 mg via INTRAVENOUS
  Administered 2013-03-18: 17:00:00 via INTRAVENOUS
  Administered 2013-03-19: 0.9 mg via INTRAVENOUS
  Administered 2013-03-19: 0.3 mg via INTRAVENOUS
  Administered 2013-03-19: 0.6 mg via INTRAVENOUS
  Filled 2013-03-18 (×2): qty 25

## 2013-03-18 MED ORDER — SODIUM CHLORIDE 0.9 % IJ SOLN: 9.0000 mL | INTRAMUSCULAR | Status: DC | PRN

## 2013-03-18 MED ORDER — POTASSIUM CHLORIDE CRYS ER 20 MEQ PO TBCR
20.0000 meq | EXTENDED_RELEASE_TABLET | Freq: Every day | ORAL | Status: DC
  Administered 2013-03-19 – 2013-04-06 (×19): 20 meq via ORAL
  Filled 2013-03-18 (×22): qty 1

## 2013-03-18 MED ORDER — ONDANSETRON HCL 4 MG/2ML IJ SOLN: 4.0000 mg | Freq: Four times a day (QID) | INTRAMUSCULAR | Status: DC | PRN

## 2013-03-18 MED ORDER — POTASSIUM CHLORIDE CRYS ER 20 MEQ PO TBCR
40.0000 meq | EXTENDED_RELEASE_TABLET | ORAL | Status: AC
  Administered 2013-03-18 (×2): 40 meq via ORAL
  Filled 2013-03-18: qty 2

## 2013-03-18 NOTE — Consult Note (Signed)
[facility]  Medical Consultation  [name] AYT:[id number] DOB: [birth year] DOA: 03/16/2013  PCP: Adella Hare, MD  Requesting physician: Joya Salm  Date of consultation: 03/17/13  Reason for consultation: hyponatremia  Impression/Recommendations  Hyponatremia, hypovolemic. Sodiums last admission normal Clinically, dehydrated, and BUN, creatinine higher than baseline. Also on thiazide and lasix as outpatient which are contributing. Agree with stopping diuretics. Saline currently running at 75 cc/hr. Will increase. Should correct with saline when euvolemic. Repeat BMET pending. Will also check TSH.  DYSLIPIDEMIA  PSEUDOGOUT  HYPERTENSION  CAD  VENOUS INSUFFICIENCY, LEGS  Rheumatoid arthritis(714.0)  HEMOCHROMATOSIS, HX OF  Atrial fibrillation  Dehydration  GERD (gastroesophageal reflux disease)  Wound infection: on vancomycin and for revision tonight  Thank you for this consultation. [facility] to follow.  Chief Complaint: fever  HPI:  67 y.o. White male admitted for fever, wound infection. Had lumbar fusion 11 days ago. BMET yesterday showed sodium 120. Normal a week ago. Hospitalists consulted today for hyponatremia. Pt reports poor appetite, intermittent vomiting since surgery. BUN 45. Creatinine 1.28, both normal a week ago. Also on thiazide and loop diuretics as outpatient, which have been discontinued. Repeat BMET pending. Wife also reports confusion. Pt c/o dry mouth and mouth pain. Some dysuria. Denies h/o CHF.  Review of Systems: Systems reviewed. As above, otherwise negative.  Past Medical History   Diagnosis  Date   .  Hx of colonic polyps    .  Diverticulosis    .  Hypertension    .  Osteoarthritis    .  Hemochromatosis      dx'd 14 yrs ago last ferritin Aug 11, 08 52 (22-322), Fe 136   .  CAD (coronary artery disease)      minimal coronary plaque in the LAD and right coronary system. PCI of a 95% obtuse marginal lesion w/ resultant spiral dissection  requiring drug-eluting stent placement. 7-06. Last nuclear stress 11-17-06 fixed anterior/ inferior defect, no inducible ischemia, EF 81%   .  Allergic rhinitis    .  Hx of colonoscopy    .  RA (rheumatoid arthritis)    .  COPD (chronic obstructive pulmonary disease)    .  PONV (postoperative nausea and vomiting)    .  Dysrhythmia  01-24-12     past hx. A.Fib x1 episode-responded to med.   .  Depression    .  Anxiety      pt. feels its related to the medicine that he takes   .  GERD (gastroesophageal reflux disease)     Past Surgical History   Procedure  Laterality  Date   .  Appendectomy     .  Inguinal hernia repair   2008     Right, left remotely   .  Total knee arthroplasty       right knee partial 2002   .  Tonsillectomy     .  Shoulder preplacement   2008     left, partial   .  Ptca w/ coated stent lad       Cx secondary to tear   .  Foot surgery   11-08     for removal of bone spurs- right foot   .  Reconstruction of ankle   6-09     Right- [facility]   .  L4-5 diskectomy w/ fusion, cage placement and rods   12-10     [place]   .  Partial knee arthroplasty       left   .  Total shoulder replacement       right   .  Joint replacement     .  Cataract extraction, bilateral   01-24-12     Bilateral   .  Orif shoulder fracture   02/06/2012     Procedure: OPEN REDUCTION INTERNAL FIXATION (ORIF) SHOULDER FRACTURE; Surgeon: Nita Sells, MD; Location: [facility]; Service: Orthopedics; Laterality: Left; ORIF of a Left Shoulder Fracture with Iliac Crest Bone Graft aspiration   .  Harvest bone graft   02/06/2012     Procedure: HARVEST ILIAC BONE GRAFT; Surgeon: Nita Sells, MD; Location: [facility]; Service: Orthopedics;; bone marrow aspirqation   .  Hardware removal   03/09/2012     Procedure: HARDWARE REMOVAL; Surgeon: Nita Sells, MD; Location: [facility]; Service: Orthopedics; Laterality: Left; Hardware Removal from Left Shoulder   .   Cardiac catheterization   2006     coronary stents- no problems   .  Shoulder arthroscopy   2013   .  Carpal tunnel release       R hand   .  Ankle surgery  Right  2008     at [facility], replacement   .  Dg toes*r*       toe surgery- 07/2012   .  Posterior lumbar fusion 4 level  N/A  03/02/2013     Procedure: Lumbar One to Sacral One Posterior lumbar interbody fusion; Surgeon: Floyce Stakes, MD; Location: [facility]; Service: Neurosurgery; Laterality: N/A; L1 to S1 Posterior lumbar interbody fusion    Social History: reports that he has never smoked. He has never used smokeless tobacco. He reports that he does not drink alcohol or use illicit drugs.  Allergies   Allergen  Reactions   .  Morphine And Related  Shortness Of Breath, Nausea And Vomiting and Swelling   .  Penicillins  Hives, Shortness Of Breath and Rash     REACTION: hives, breathing problems   .  Percocet [Oxycodone-Acetaminophen]  Anxiety    Family History   Problem  Relation  Age of Onset   .  Uterine cancer  Mother      survivor   .  Macular degeneration  Mother    .  Other  Mother      ankle edema   .  Lung cancer  Mother    .  Cancer  Mother      ovarian, lung   .  Coronary artery disease  Father    .  Hypertension  Father    .  Prostate cancer  Father    .  Colon polyps  Father    .  Cancer  Father      prostate   .  Thyroid disease  Sister    .  Heart attack  Brother    .  Hyperlipidemia  Brother    .  Other  Brother      Schizophrenic   .  Diabetes  Neg Hx    .  Colon cancer  Neg Hx    .  Esophageal cancer  Neg Hx    .  Rectal cancer  Neg Hx    .  Stomach cancer  Neg Hx    .  Coronary artery disease  Maternal Aunt    .  Heart attack  Maternal Aunt    .  Rheum arthritis  Sister    .  Hemochromatosis  Sister     Prior  to Admission medications   Medication  Sig  Start Date  End Date  Taking?  Authorizing Provider   Aclidinium Bromide (TUDORZA PRESSAIR) 400 MCG/ACT AEPB  Inhale 1 puff into the lungs  2 (two) times daily.  01/23/12   Yes  Neena Rhymes, MD   adalimumab (HUMIRA) 40 MG/0.8ML injection  Inject 40 mg into the skin every 14 (fourteen) days.    Yes  Historical Provider, MD   ALPRAZolam Duanne Moron) 0.5 MG tablet  Take 1 tablet (0.5 mg total) by mouth 3 (three) times daily as needed for anxiety or sleep.  03/11/13   Yes  Tiffany L Reed, DO   aspirin (ASPIRIN EC) 81 MG EC tablet  Take 81 mg by mouth every morning.    Yes  Historical Provider, MD   celecoxib (CELEBREX) 200 MG capsule  Take 200 mg by mouth 2 (two) times daily.    Yes  Historical Provider, MD   cholecalciferol (VITAMIN D) 1000 UNITS tablet  Take 1 tablet (1,000 Units total) by mouth daily.  06/24/12  06/24/13  Yes  Aleksei V Plotnikov, MD   COLCRYS 0.6 MG tablet  TAKE 1 TABLET (0.6 MG TOTAL) BY MOUTH 2 (TWO) TIMES DAILY.  02/24/13   Yes  Neena Rhymes, MD   FLUoxetine (PROZAC) 40 MG capsule  Take 40 mg by mouth 2 (two) times daily.    Yes  Historical Provider, MD   fluticasone (FLONASE) 50 MCG/ACT nasal spray  Place 2 sprays into both nostrils daily.    Yes  Historical Provider, MD   furosemide (LASIX) 20 MG tablet  Take 20 mg by mouth daily.    Yes  Historical Provider, MD   gabapentin (NEURONTIN) 300 MG capsule  Take 300-600 mg by mouth 4 (four) times daily. 600 mg every morning, 300 mg at noon, 600 mg at bedtime, and 300 mg at 1 AM    Yes  Historical Provider, MD   hydrochlorothiazide (HYDRODIURIL) 25 MG tablet  Take 25 mg by mouth daily before breakfast.    Yes  Historical Provider, MD   HYDROmorphone (DILAUDID) 2 MG tablet  Take one tablet by mouth every four hours as needed  03/11/13   Yes  Tiffany L Reed, DO   losartan (COZAAR) 50 MG tablet  TAKE 1 TABLET BY MOUTH ONCE A DAY  02/28/13   Yes  Neena Rhymes, MD   Melatonin ([employer] MELATONIN) 5 MG TABS  Take 5 mg by mouth at bedtime.    Yes  Historical Provider, MD   OxyCODONE (OXYCONTIN) 20 mg T12A 12 hr tablet  Take 1 tablet (20 mg total) by mouth every 12 (twelve) hours.   03/11/13   Yes  Tiffany L Reed, DO   pantoprazole (PROTONIX) 40 MG tablet  Take 40 mg by mouth 2 (two) times daily.  07/29/12   Yes  Neena Rhymes, MD   Polyethyl Glycol-Propyl Glycol (SYSTANE ULTRA) 0.4-0.3 % SOLN  Apply 2 drops to eye 2 (two) times daily as needed (dry eyes).    Yes  Historical Provider, MD   potassium chloride SA (K-DUR,KLOR-CON) 20 MEQ tablet  Take 20 mEq by mouth daily.    Yes  Historical Provider, MD   predniSONE (DELTASONE) 5 MG tablet  Take 5 mg by mouth every evening.    Yes  Historical Provider, MD   PRESCRIPTION MEDICATION  Inject as directed once.    Yes  Historical Provider, MD   simvastatin (ZOCOR) 40 MG tablet  Take 40 mg by mouth daily. At lunch    Yes  Historical Provider, MD   traZODone (DESYREL) 100 MG tablet  Take 100 mg by mouth at bedtime. Take just before bedtime    Yes  Historical Provider, MD   Elastic Bandages & Supports (ABDOMINAL BINDER/ELASTIC MED) MISC  1 Device by Does not apply route as needed.  08/06/12    Webb Silversmith, NP   Physical Exam:  Blood pressure 92/56, pulse 105, temperature 99.4 F (37.4 C), temperature source Oral, resp. rate 18, height 5' 6.14" (1.68 m), weight 71.8 kg (158 lb 4.6 oz), SpO2 92.00%.  Filed Vitals:    03/17/13 1749   BP:  92/56   Pulse:  105   Temp:  99.4 F (37.4 C)   Resp:  18    BP 92/56  Pulse 105  Temp(Src) 99.4 F (37.4 C) (Oral)  Resp 18  Ht 5' 6.14" (1.68 m)  Wt 71.8 kg (158 lb 4.6 oz)  BMI 25.44 kg/m2  SpO2 92%  General Appearance:  Alert, cooperative, no distress, appears stated age. Occasionally confused   Head:  Normocephalic, without obvious abnormality, atraumatic   Eyes:  PERRL, conjunctiva/corneas clear, EOM's intact, fundi  benign, both eyes      Nose:  Nares normal, septum midline, mucosa normal, no drainage  or sinus tenderness   Throat:  dry mucous membranes, with angular chelitis. No ulcerations   Neck:  Supple, symmetrical, trachea midline, no adenopathy;  thyroid: No  enlargement/tenderness/nodules; no carotid  bruit or JVD   Back:  Symmetric, no curvature, ROM normal, no CVA tenderness   Lungs:  Clear to auscultation bilaterally, respirations unlabored   Chest wall:  No tenderness or deformity   Heart:  Regular rate and rhythm, S1 and S2 normal, no murmur, rub  or gallop   Abdomen:  Soft, non-tender, bowel sounds active all four quadrants,  no masses, no organomegaly   Genitalia:  deferred   Rectal:  deferred   Extremities:  Extremities normal, atraumatic, no cyanosis or edema   Pulses:  2+ and symmetric all extremities   Skin:  Skin color, texture, turgor normal, no rashes or lesions   Lymph nodes:  Cervical, supraclavicular, and axillary nodes normal   Neurologic:  CNII-XII intact. Normal strength, sensation and reflexes  throughout   Labs on Admission:  Basic Metabolic Panel:  Recent Labs  Lab  03/16/13 1915   NA  120*   K  4.3   CL  84*   CO2  22   GLUCOSE  195*   BUN  45*   CREATININE  1.28   CALCIUM  9.5   Liver Function Tests:  No results found for this basename: AST, ALT, ALKPHOS, BILITOT, PROT, ALBUMIN, in the last 168 hours  No results found for this basename: LIPASE, AMYLASE, in the last 168 hours  No results found for this basename: AMMONIA, in the last 168 hours  CBC:   Recent Labs  Lab  03/16/13 1915   WBC  15.9*   NEUTROABS  13.4*   HGB  10.2*   HCT  29.2*   MCV  86.6   PLT  354    Cardiac Enzymes:  No results found for this basename: CKTOTAL, CKMB, CKMBINDEX, TROPONINI, in the last 168 hours  BNP:  No components found with this basename: POCBNP,  CBG:  No results found for this basename: GLUCAP, in the last 168 hours  Radiological Exams on Admission:  Dg Chest 2  View  03/17/2013 CLINICAL DATA: Fever, hypotension EXAM: CHEST 2 VIEW COMPARISON: 07/14/2012, CT 08/11/2012 FINDINGS: Right cardiophrenic angle scarring is reidentified with stable right hemidiaphragmatic elevation. Bilateral humeral arthroplasty and  lumbar fusion hardware noted. Rightward curvature of the thoracic spine centered at its mid portion is reidentified. Heart size is normal. No new pulmonary opacity. No pleural effusion. IMPRESSION: Chronic right basilar scarring. No new finding. Electronically Signed By: Conchita Paris M.D. On: 03/17/2013 12:59   Time spent: 60 minutes  [facility]  Pager [phone]  If 7PM-7AM, please contact night-coverage  [email or web address]  Password [email or web address]  03/17/2013, 8:12 PM

## 2013-03-18 NOTE — Progress Notes (Signed)
Physical Therapy Treatment Patient Details Name: [name] MRN: [id number] DOB: 1945-05-28 Today's Date: 03/18/2013 Time: 1000-1036 PT Time Calculation (min): 36 min  PT Assessment / Plan / Recommendation  History of Present Illness Pt is a 67 yo male who has spinal surgery 11 days ago, now presents for symptoms of post operative infection.   PT Comments   Patient with increased pain, discomfort, and reports chills all night. Still moving well despite symptoms of infection. Will continue to progress activity as tolerated.  Follow Up Recommendations  Home health PT;Supervision - Intermittent     Does the patient have the potential to tolerate intense rehabilitation     Barriers to Discharge        Equipment Recommendations  None recommended by PT    Recommendations for Other Services    Frequency Min 5X/week   Progress towards PT Goals Progress towards PT goals: Progressing toward goals  Plan Current plan remains appropriate    Precautions / Restrictions Precautions Precautions: Back Type of Shoulder Precautions: Pt able to state 3/3 precautions. Precaution Booklet Issued: No Precaution Comments: cues for precautions during session. Required Braces or Orthoses: Spinal Brace Spinal Brace: Lumbar corset;Applied in standing position   Pertinent Vitals/Pain 9/10 pain back, and 7/10 left shoulder pain    Mobility  Bed Mobility Bed Mobility: Rolling Left;Sit to Sidelying Right;Scooting to [facility] Rolling Left: 5: Supervision Sit to Sidelying Right: 5: Supervision Scooting to [facility]: 5: Supervision Details for Bed Mobility Assistance: VCs for compliance and sequencing Transfers Transfers: Sit to Stand;Stand to Sit Sit to Stand: 4: Min guard Stand to Sit: 4: Min guard Ambulation/Gait Ambulation/Gait Assistance: 4: Min guard Ambulation Distance (Feet): 240 Feet Assistive device: Rolling walker Ambulation/Gait Assistance Details: one standing rest break Gait Pattern:  Step-through pattern;Decreased stride length Gait velocity: VCs for precaution compliance and safety    Exercises Other Exercises Other Exercises: patient with increased pain today, discussed strategies for spasm relief as well as encouraged deep breathing techniques to improve saturation     PT Goals (current goals can now be found in the care plan section) Acute Rehab PT Goals Patient Stated Goal: to go to rehab PT Goal Formulation: With patient Time For Goal Achievement: 03/12/13 Potential to Achieve Goals: Good  Visit Information  Last PT Received On: 03/18/13 Assistance Needed: +1 History of Present Illness: Pt is a 67 yo male who has spinal surgery 11 days ago, now presents for symptoms of post operative infection.    Subjective Data  Subjective: i feel very odd, and in a lot of pain Patient Stated Goal: to go to rehab   Cognition  Cognition Arousal/Alertness: Awake/alert Behavior During Therapy: WFL for tasks assessed/performed Overall Cognitive Status: Impaired/Different from baseline Area of Impairment: Orientation;Memory Orientation Level: Place;Time;Situation Memory: Decreased short-term memory General Comments: Pt unsure which facility he is currently at, states that he has not left that he just moved to the ICU, unable to provide accurate account of events since previous admission. (Pt was seen by this therapist last wk prior to discharge to [facility], patient was a direct admit to floor. Patient also not oriented to time of day, advised patient of lights on, blinds open day time precaution to assist with reorientation.        End of Session PT - End of Session Equipment Utilized During Treatment: Gait belt;Back brace Activity Tolerance: Patient tolerated treatment well Patient left: in chair;with call bell/phone within reach Nurse Communication: Mobility status   GP  [name] 03/18/2013, 10:41 AM [name], PT DPT  [phone]

## 2013-03-18 NOTE — Progress Notes (Signed)
Patient ID: [name], male   DOB: 1945-05-15, 67 y.o.   MRN: [id number] Sodium up to 129. Potassium 2,8 c/o incisional pain, no weakness. Cultures pending. ID to see him

## 2013-03-18 NOTE — Op Note (Signed)
NAMEKIYAAN, HAQ NO.:  [id number]  MEDICAL RECORD NO.:  [id number]  LOCATION:  [id number]                        FACILITY:  [facility]  PHYSICIAN:  Leeroy Cha, M.D.   DATE OF BIRTH:  05/24/1945  DATE OF PROCEDURE:  03/17/2013 DATE OF DISCHARGE:                              OPERATIVE REPORT   PREOPERATIVE DIAGNOSIS:  Status post L1 to S1 fusion, drainage from the lumbar wound 12-day postop, questionable fever.  POSTOPERATIVE DIAGNOSIS:  Status post L1 to S1 fusion, drainage from the lumbar wound 12-day postop, questionable fever.  PROCEDURE:  Revision of the lumbar wound.  SURGEON:  Leeroy Cha, M.D.  CLINICAL HISTORY:  The patient is a gentleman, who is 67 year old, who had surgery about 2 days ago.  He went home, he was doing fairly well, but since Sunday he has been draining.  He was to come to see me in my office on Monday, but he never showed up.  He came to the emergency room and there was history of vomiting villous liquid and questionable fever. He was admitted.  The wound was inspected by me in the same day and there was some redness at the edges.  Culture was taken.  Today, although the patient remained afebrile, he has bit more drainage.  There was no foul-smelling.  Because of the large surgery and questionable fever, I decided to go ahead with inspection of the lumbar wound.  PROCEDURE IN DETAIL:  The patient was taken to the OR, and after intubation, he was positioned in a prone manner.  The wound was cleaned with DuraPrep and the stitches were removed.  Incision was made.  We went straight down to the subcutaneous space.  There was some drainage, which was mostly liquified blood.  Specimen was taken and sent to the lab.  I opened one stitch in the fascia and the area mostly looked like seroma.  Another specimen was taken.  The area was irrigated with 2 L of saline solution.  The wound were closed by using Prolene, 1-0 Vicryl, and staples.   A drain was left in the epidural space.          ______________________________ Leeroy Cha, M.D.     EB/MEDQ  D:  03/17/2013  T:  03/18/2013  Job:  [id number]

## 2013-03-18 NOTE — Progress Notes (Signed)
[facility] PROGRESS NOTE  [name] TMA:[id number] DOB: 1945/05/21 DOA: 03/16/2013 PCP: Adella Hare, MD  Discussed with Dr. Linda Hedges. He will see patient 12/12  Assessment/Plan:    Hyponatremia, hypovolemic: improving.  Continue saline infusion.  TSH ok.    Hypokalemia: replete   Confusion: Likely multifactorial: infection, hyponatremia, dehydration, medications  Recent L1 - S1 fusion with infection, s/p wound revision: ID consulted. On vancomycin    DYSLIPIDEMIA    PSEUDOGOUT    HYPERTENSION    CAD    VENOUS INSUFFICIENCY, LEGS    Rheumatoid arthritis(714.0)    HEMOCHROMATOSIS, HX OF    Dehydration    GERD (gastroesophageal reflux disease)   Code Status:  full Family Communication:   Disposition Plan:  home   HPI/Subjective: C/o pain. Per RN, not pressing PCA, but getting oral opiates.  Objective: Filed Vitals:   03/18/13 1355  BP:   Pulse:   Temp:   Resp: 16    Intake/Output Summary (Last 24 hours) at 03/18/13 1425 Last data filed at 03/18/13 0803  Gross per 24 hour  Intake   2150 ml  Output    750 ml  Net   1400 ml   Filed Weights   03/16/13 1700  Weight: 71.8 kg (158 lb 4.6 oz)    Exam:   General:  Alert, sometimes innapropriate.  Appears comfortable  Cardiovascular: RRR without MGR  Respiratory: CTA without WRR  Abdomen: S, NT, ND  Ext: No CCE  Basic Metabolic Panel:  Recent Labs Lab 03/16/13 1915 03/17/13 1821 03/18/13 0605  NA 120* 128* 129*  K 4.3 2.7* 2.9*  CL 84* 88* 95*  CO2 22 32 29  GLUCOSE 195* 102* 102*  BUN 45* 19 13  CREATININE 1.28 0.67 0.53  CALCIUM 9.5 8.3* 7.3*  MG  --  1.8  --    Liver Function Tests: No results found for this basename: AST, ALT, ALKPHOS, BILITOT, PROT, ALBUMIN,  in the last 168 hours No results found for this basename: LIPASE, AMYLASE,  in the last 168 hours No results found for this basename: AMMONIA,  in the last 168 hours CBC:  Recent Labs Lab  03/16/13 1915  WBC 15.9*  NEUTROABS 13.4*  HGB 10.2*  HCT 29.2*  MCV 86.6  PLT 354   Cardiac Enzymes: No results found for this basename: CKTOTAL, CKMB, CKMBINDEX, TROPONINI,  in the last 168 hours BNP (last 3 results) No results found for this basename: PROBNP,  in the last 8760 hours CBG: No results found for this basename: GLUCAP,  in the last 168 hours  Recent Results (from the past 240 hour(s))  WOUND CULTURE     Status: None   Collection Time    03/16/13  5:36 PM      Result Value Range Status   Specimen Description WOUND   Final   Special Requests LUMBAR CULTURE   Final   Gram Stain     Final   Value: RARE WBC PRESENT, PREDOMINANTLY MONONUCLEAR     NO ORGANISMS SEEN     Performed at [facility]   Culture     Final   Value: MODERATE [place]     Performed at [facility]   Report Status PENDING   Incomplete  URINE CULTURE     Status: None   Collection Time    03/16/13  6:01 PM      Result Value Range Status   Specimen Description URINE, CLEAN CATCH  Final   Special Requests fever   Final   Culture  Setup Time     Final   Value: 03/16/2013 18:55     Performed at [place]     Final   Value: 75,000 COLONIES/ML     Performed at [facility]   Culture     Final   Value: PSEUDOMONAS AERUGINOSA     GRAM NEGATIVE RODS     Performed at [facility]   Report Status PENDING   Incomplete  WOUND CULTURE     Status: None   Collection Time    03/17/13  9:56 PM      Result Value Range Status   Specimen Description WOUND BACK   Final   Special Requests SUPERFICIAL LUMBAR WOUND   Final   Gram Stain     Final   Value: FEW WBC PRESENT,BOTH PMN AND MONONUCLEAR     NO SQUAMOUS EPITHELIAL CELLS SEEN     NO ORGANISMS SEEN     Performed at [facility]     Performed at [facility]   Culture     Final   Value: NO GROWTH     Performed at [facility]   Report Status PENDING    Incomplete  ANAEROBIC CULTURE     Status: None   Collection Time    03/17/13  9:56 PM      Result Value Range Status   Specimen Description WOUND BACK   Final   Special Requests SUPERFICIAL LUMBAR WOUND   Final   Gram Stain     Final   Value: FEW WBC PRESENT,BOTH PMN AND MONONUCLEAR     NO SQUAMOUS EPITHELIAL CELLS SEEN     NO ORGANISMS SEEN     Performed at [facility]     Performed at [facility]   Culture     Final   Value: NO ANAEROBES ISOLATED; CULTURE IN PROGRESS FOR 5 DAYS     Performed at [facility]   Report Status PENDING   Incomplete  GRAM STAIN     Status: None   Collection Time    03/17/13  9:56 PM      Result Value Range Status   Specimen Description WOUND BACK   Final   Special Requests SUPERFICIAL LUMBAR WOUND   Final   Gram Stain     Final   Value: FEW WBC PRESENT,BOTH PMN AND MONONUCLEAR     NO ORGANISMS SEEN   Report Status 03/17/2013 FINAL   Final     Studies: Dg Chest 2 View  03/17/2013   CLINICAL DATA:  Fever, hypotension  EXAM: CHEST  2 VIEW  COMPARISON:  07/14/2012, CT 08/11/2012  FINDINGS: Right cardiophrenic angle scarring is reidentified with stable right hemidiaphragmatic elevation. Bilateral humeral arthroplasty and lumbar fusion hardware noted. Rightward curvature of the thoracic spine centered at its mid portion is reidentified. Heart size is normal. No new pulmonary opacity. No pleural effusion.  IMPRESSION: Chronic right basilar scarring.  No new finding.   Electronically Signed   By: Conchita Paris M.D.   On: 03/17/2013 12:59    Scheduled Meds: . aspirin EC  81 mg Oral Daily  . colchicine  0.6 mg Oral BID  . docusate sodium  100 mg Oral BID  . FLUoxetine  40 mg Oral BID  . fluticasone  2 spray Each Nare Daily  . gabapentin  300 mg Oral Custom  .  gabapentin  600 mg Oral BID  . heparin subcutaneous  5,000 Units Subcutaneous Q8H  . HYDROmorphone PCA 0.3 mg/mL   Intravenous Q4H  . losartan  50 mg Oral Daily  .  magic mouthwash  15 mL Oral TID  . OxyCODONE  20 mg Oral Q12H  . pantoprazole  40 mg Oral BID  . potassium chloride SA  40 mEq Oral Daily  . predniSONE  5 mg Oral Q supper  . simvastatin  40 mg Oral q1800  . sodium chloride  3 mL Intravenous Q12H  . tiotropium  18 mcg Inhalation Daily  . traZODone  100 mg Oral QHS  . vancomycin  750 mg Intravenous Q12H   Continuous Infusions: . sodium chloride    . sodium chloride 75 mL/hr at 03/17/13 0911    Time spent: 35 minutes  [facility] Pager [phone]. If 7PM-7AM, please contact night-coverage at [email or web address], password [facility] 03/18/2013, 2:25 PM  LOS: 2 days

## 2013-03-19 DIAGNOSIS — T84498A Other mechanical complication of other internal orthopedic devices, implants and grafts, initial encounter: Secondary | ICD-10-CM

## 2013-03-19 DIAGNOSIS — E871 Hypo-osmolality and hyponatremia: Secondary | ICD-10-CM

## 2013-03-19 DIAGNOSIS — G8929 Other chronic pain: Secondary | ICD-10-CM

## 2013-03-19 DIAGNOSIS — B370 Candidal stomatitis: Secondary | ICD-10-CM

## 2013-03-19 DIAGNOSIS — Z5189 Encounter for other specified aftercare: Secondary | ICD-10-CM

## 2013-03-19 DIAGNOSIS — Z880 Allergy status to penicillin: Secondary | ICD-10-CM

## 2013-03-19 DIAGNOSIS — E860 Dehydration: Secondary | ICD-10-CM

## 2013-03-19 LAB — BASIC METABOLIC PANEL
BUN: 7 mg/dL (ref 6–23)
CO2: 29 mEq/L (ref 19–32)
Calcium: 8.1 mg/dL — ABNORMAL LOW (ref 8.4–10.5)
Chloride: 95 mEq/L — ABNORMAL LOW (ref 96–112)
Creatinine, Ser: 0.56 mg/dL (ref 0.50–1.35)
GFR calc Af Amer: >90 mL/min (ref >90)
GFR calc non Af Amer: >90 mL/min (ref >90)
Glucose, Bld: 85 mg/dL (ref 70–99)
Potassium: 3.7 mEq/L (ref 3.5–5.1)
Sodium: 131 mEq/L — ABNORMAL LOW (ref 135–145)

## 2013-03-19 LAB — URINE CULTURE: Colony Count: 75000

## 2013-03-19 LAB — COMPREHENSIVE METABOLIC PANEL
ALT: 18 U/L (ref 0–53)
AST: 31 U/L (ref 0–37)
Albumin: 2.7 g/dL — ABNORMAL LOW (ref 3.5–5.2)
Alkaline Phosphatase: 104 U/L (ref 39–117)
BUN: 8 mg/dL (ref 6–23)
CO2: 22 mEq/L (ref 19–32)
Calcium: 8.2 mg/dL — ABNORMAL LOW (ref 8.4–10.5)
Chloride: 95 mEq/L — ABNORMAL LOW (ref 96–112)
Creatinine, Ser: 0.56 mg/dL (ref 0.50–1.35)
GFR calc Af Amer: >90 mL/min (ref >90)
GFR calc non Af Amer: >90 mL/min (ref >90)
Glucose, Bld: 111 mg/dL — ABNORMAL HIGH (ref 70–99)
Potassium: 4.8 mEq/L (ref 3.5–5.1)
Sodium: 130 mEq/L — ABNORMAL LOW (ref 135–145)
Total Bilirubin: 0.7 mg/dL (ref 0.3–1.2)
Total Protein: 6 g/dL (ref 6.0–8.3)

## 2013-03-19 LAB — HIV ANTIBODY (ROUTINE TESTING W REFLEX): HIV: NONREACTIVE

## 2013-03-19 LAB — WOUND CULTURE

## 2013-03-19 MED ORDER — DEXTROSE 5 % IV SOLN
1.0000 g | Freq: Once | INTRAVENOUS | Status: AC
  Administered 2013-03-19: 1 g via INTRAVENOUS
  Filled 2013-03-19: qty 1

## 2013-03-19 MED ORDER — OXYCODONE HCL 5 MG PO TABS
10.0000 mg | ORAL_TABLET | ORAL | Status: DC | PRN
  Administered 2013-03-19 – 2013-04-05 (×36): 10 mg via ORAL
  Filled 2013-03-19 (×40): qty 2

## 2013-03-19 MED ORDER — CIPROFLOXACIN IN D5W 400 MG/200ML IV SOLN
400.0000 mg | Freq: Three times a day (TID) | INTRAVENOUS | Status: DC
  Administered 2013-03-19: 400 mg via INTRAVENOUS
  Filled 2013-03-19 (×3): qty 200

## 2013-03-19 MED ORDER — DEXTROSE 5 % IV SOLN
1.0000 g | Freq: Two times a day (BID) | INTRAVENOUS | Status: DC
  Administered 2013-03-19 – 2013-04-05 (×34): 1 g via INTRAVENOUS
  Filled 2013-03-19 (×38): qty 1

## 2013-03-19 NOTE — Progress Notes (Signed)
Patient ID: [name], male   DOB: February 09, 1946, 67 y.o.   MRN: [id number] ID to help [place]. i will out of town till 03/24/13. Dr Ellene Route and my partners to help with mr [facility] care. He is aware of it

## 2013-03-19 NOTE — Clinical Social Work Note (Signed)
CSW received consult for possible SNF placement upon medical discharge from [facility]. PT/OT recommending home health services for pt at time of discharge. CSW signing off.  Pati Gallo, [place] Social Worker [phone]

## 2013-03-19 NOTE — Progress Notes (Signed)
Subjective: [name] was readmitted for exploration of surgical wound. He had undergone L1-S1 fusion and after discharge he was having drainage from the wound site along w/ fever, N/V and mild confusion. He was taken tot he OR for wound exploration with findings of old blood and seroma. Cultures were taken coming back positive for pseudomonas and scant Klebsiella. During his stay he has been hyponatremic. IV NS has been infusing and his sodium has improved.   [name] is awake and alert, recognizes examiner and knows where he is and why. He does report unremitting pain. His appetite is poor.    Objective: Lab:  Recent Labs  03/16/13 1915  WBC 15.9*  NEUTROABS 13.4*  HGB 10.2*  HCT 29.2*  MCV 86.6  PLT 354    Recent Labs  03/17/13 1821 03/18/13 0605 03/18/13 2301 03/19/13 0601  NA 128* 129* 130* 131*  K 2.7* 2.9* 4.8 3.7  CL 88* 95* 95* 95*  GLUCOSE 102* 102* 111* 85  BUN 19 13 8 7   CREATININE 0.67 0.53 0.56 0.56  CALCIUM 8.3* 7.3* 8.2* 8.1*  MG 1.8  --   --   --    Wound culture: predominantly pseudomonas with some Klebsiella. SS- both organisms sensitive to Ciprofloxacin.  Imaging: no new imaging  Scheduled Meds: . aspirin EC  81 mg Oral Daily  . colchicine  0.6 mg Oral BID  . docusate sodium  100 mg Oral BID  . FLUoxetine  40 mg Oral BID  . fluticasone  2 spray Each Nare Daily  . gabapentin  300 mg Oral Custom  . gabapentin  600 mg Oral BID  . heparin subcutaneous  5,000 Units Subcutaneous Q8H  . HYDROmorphone PCA 0.3 mg/mL   Intravenous Q4H  . losartan  50 mg Oral Daily  . magic mouthwash  15 mL Oral TID  . OxyCODONE  20 mg Oral Q12H  . pantoprazole  40 mg Oral BID  . potassium chloride  20 mEq Oral Daily  . predniSONE  5 mg Oral Q supper  . simvastatin  40 mg Oral q1800  . sodium chloride  3 mL Intravenous Q12H  . tiotropium  18 mcg Inhalation Daily  . traZODone  100 mg Oral QHS  . vancomycin  750 mg Intravenous Q12H   Continuous Infusions: .  sodium chloride    . sodium chloride 75 mL/hr at 03/17/13 0911   PRN Meds:.acetaminophen, acetaminophen, diazepam, diphenhydrAMINE, diphenhydrAMINE, HYDROmorphone (DILAUDID) injection, menthol-cetylpyridinium, naloxone, ondansetron (ZOFRAN) IV, oxyCODONE-acetaminophen, phenol, sodium chloride, sodium chloride, zolpidem   Physical Exam: Filed Vitals:   03/19/13 0749  BP:   Pulse:   Temp:   Resp: 16   Gen'l - ill appearing man who is not in acute distress HEENT - C&S clear Cor 2+ radial pulse, RRR, w/o mm/r/g Pulm - normal respiratory effort, no rales or wheezes Abd - BS+ , soft Neuro - awake and alert.      Assessment/Plan: 1. NS - pain management per Dr. Joya Salm  2. ID - per op report wound area w/o frank exudate, cultures are positive Plan Change antibiotics based on sensitivities: stop vanc. Start IV cipro 400 mg IV q 8  3. Hyponatremia - sodium is improved. Fluid intake is already poor/restricted Plan Continue IV NS 50 CC/hr  F/u Bmet  4. HTN - stable   Adella Hare [facility] (o) [phone]; (c) [place]  Tele: [phone]  03/19/2013, 8:39 AM

## 2013-03-19 NOTE — Consult Note (Signed)
INFECTIOUS DISEASE CONSULT NOTE  Date of Admission:  03/16/2013  Date of Consult:  03/19/2013  Reason for Consult: lumbar wound infection Referring Physician: botero  Impression/Recommendation Lumbar wound infection with hardware Hx of PEN allergy Thrush  Will give him one time dose of cefepime to see how he tolerates. Will stop vanco Will place PIC Will start magic mouithwash  Comment- Difficult case of lumbar wound infection with hardware remaining and PEN allergy. Given that this occurred many years ago, and risk of PEN -- Cephalosporin cross reaction is ~8-10%, will give him test dose.  Dr Baxter Flattery will see over weekend.   Thank you so much for this interesting consult,   Bobby Rumpf (pager) [phone] [email or web address]  [name] is an 67 y.o. male.  HPI:  67 yo M with a hx of hemochromatosis, HTN COPD, RA and lumbar pain. He had undergone L4-5 fusion (2010). He returned to the hospital on  03-02-13 and underwent L1-S1 decompression and fusion. His post-op course was complicated by refractory hypotension and he required pressor support. By 11-28 he was able to transfer out of ICU. He also had difficulty with radiated pain post-op and was given a course of decadron. He was d/c to SNF on 12-3 He returned to [facility] on 12-9 with fever to 102 at home,  purulent wound drainage and erythema. He also had become confused. He was noted to have cough productive of thick yellow sputum and hyponatremia (120). He was started on vancomycin. On 12-10 he was taken to OR and had debridement of his wound.  He appears tohave had wound swab sent 12-9 which grew P aeruginosa (pan-sens). A repeat Cx done on 12-10 is also growing P aeruginosa. His UCx from admission is growing K pneumo (R-nitro). Tmax 102 in last 24h.    Past Medical History  Diagnosis Date  . Hx of colonic polyps   . Diverticulosis   . Hypertension   . Osteoarthritis   . Hemochromatosis     dx'd 14 yrs ago last  ferritin Aug 11, 08 52 (22-322), Fe 136  . CAD (coronary artery disease)     minimal coronary plaque in the LAD and right coronary system. 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 7-06. Last nuclear stress 11-17-06 fixed anterior/ inferior defect, no inducible ischemia, EF 81%  . Allergic rhinitis   . Hx of colonoscopy   . RA (rheumatoid arthritis)   . COPD (chronic obstructive pulmonary disease)   . PONV (postoperative nausea and vomiting)   . Dysrhythmia 01-24-12    past hx. A.Fib x1 episode-responded to med.  . Depression   . Anxiety     pt. feels its related to the medicine that he takes   . GERD (gastroesophageal reflux disease)     Past Surgical History  Procedure Laterality Date  . Appendectomy    . Inguinal hernia repair  2008    Right, left remotely  . Total knee arthroplasty      right knee partial 2002  . Tonsillectomy    . Shoulder preplacement  2008    left, partial  . Ptca w/ coated stent lad      Cx secondary to tear  . Foot surgery  11-08    for removal of bone spurs- right foot  . Reconstruction of ankle  6-09    Right- [facility]  . L4-5 diskectomy w/ fusion, cage placement and rods  12-10    [facility]  .  Partial knee arthroplasty      left  . Total shoulder replacement      right  . Joint replacement    . Cataract extraction, bilateral  01-24-12    Bilateral  . Orif shoulder fracture  02/06/2012    Procedure: OPEN REDUCTION INTERNAL FIXATION (ORIF) SHOULDER FRACTURE;  Surgeon: Nita Sells, MD;  Location: [facility];  Service: Orthopedics;  Laterality: Left;  ORIF of a Left Shoulder Fracture with  Iliac Crest Bone Graft aspiration   . Harvest bone graft  02/06/2012    Procedure: HARVEST ILIAC BONE GRAFT;  Surgeon: Nita Sells, MD;  Location: [facility];  Service: Orthopedics;;  bone marrow aspirqation   . Hardware removal  03/09/2012    Procedure: HARDWARE REMOVAL;  Surgeon: Nita Sells, MD;  Location: [place];  Service: Orthopedics;  Laterality: Left;  Hardware Removal from Left Shoulder  . Cardiac catheterization  2006    coronary stents- no problems  . Shoulder arthroscopy  2013  . Carpal tunnel release      R hand  . Ankle surgery Right 2008    at [facility], replacement   . Dg toes*r*      toe surgery- 07/2012  . Posterior lumbar fusion 4 level N/A 03/02/2013    Procedure: Lumbar One to Sacral One Posterior lumbar interbody fusion;  Surgeon: Floyce Stakes, MD;  Location: [place] [facility];  Service: Neurosurgery;  Laterality: N/A;  L1 to S1 Posterior lumbar interbody fusion  . Lumbar wound debridement N/A 03/17/2013    Procedure: Incision and drainage of superficial lumbar wound;  Surgeon: Floyce Stakes, MD;  Location: [place] [facility];  Service: Neurosurgery;  Laterality: N/A;  Incision and drainage of superficial lumbar wound     Allergies  Allergen Reactions  . Morphine And Related Shortness Of Breath, Nausea And Vomiting and Swelling  . Penicillins Hives, Shortness Of Breath and Rash    REACTION: hives, breathing problems  . Percocet [Oxycodone-Acetaminophen] Anxiety    Medications:  Scheduled: . aspirin EC  81 mg Oral Daily  . ciprofloxacin  400 mg Intravenous Q8H  . colchicine  0.6 mg Oral BID  . docusate sodium  100 mg Oral BID  . FLUoxetine  40 mg Oral BID  . fluticasone  2 spray Each Nare Daily  . gabapentin  300 mg Oral Custom  . gabapentin  600 mg Oral BID  . heparin subcutaneous  5,000 Units Subcutaneous Q8H  . losartan  50 mg Oral Daily  . magic mouthwash  15 mL Oral TID  . OxyCODONE  20 mg Oral Q12H  . pantoprazole  40 mg Oral BID  . potassium chloride  20 mEq Oral Daily  . predniSONE  5 mg Oral Q supper  . simvastatin  40 mg Oral q1800  . sodium chloride  3 mL Intravenous Q12H  . tiotropium  18 mcg Inhalation Daily  . traZODone  100 mg Oral QHS    Total days of antibiotics: 3 (vanco 12-9, cipro  12-12)     PEN allergy was > 20 yrs ago      Social History:  reports that he has never smoked. He has never used smokeless tobacco. He reports that he does not drink alcohol or use illicit drugs.  Family History  Problem Relation Age of Onset  . Uterine cancer Mother     survivor  . Macular degeneration Mother   . Other Mother     ankle edema  .  Lung cancer Mother   . Cancer Mother     ovarian, lung  . Coronary artery disease Father   . Hypertension Father   . Prostate cancer Father   . Colon polyps Father   . Cancer Father     prostate  . Thyroid disease Sister   . Heart attack Brother   . Hyperlipidemia Brother   . Other Brother     Schizophrenic  . Diabetes Neg Hx   . Colon cancer Neg Hx   . Esophageal cancer Neg Hx   . Rectal cancer Neg Hx   . Stomach cancer Neg Hx   . Coronary artery disease Maternal Aunt   . Heart attack Maternal Aunt   . Rheum arthritis Sister   . Hemochromatosis Sister     General ROS: + constipation, no diarrhea, + anorexia, no urinary d/c, + dysuria, no paresthesias, see HPI.   Blood pressure 151/78, pulse 113, temperature 99.4 F (37.4 C), temperature source Oral, resp. rate 18, height 5' 6.14" (1.68 m), weight 71.8 kg (158 lb 4.6 oz), SpO2 95.00%. General appearance: alert, cooperative, flushed, no distress and mild recall problems Eyes: negative findings: pupils equal, round, reactive to light and accomodation Throat: normal findings: pink and moist and abnormal findings: thrush Neck: no adenopathy and supple, symmetrical, trachea midline Back: his wound is undressed to about 2/3 down. it is clean, no erythema.  Lungs: clear to auscultation bilaterally Heart: regular rate and rhythm Abdomen: normal findings: bowel sounds normal and soft, non-tender Extremities: edema none Neurologic: Motor: strength (plantar and grip) are 5/5   Results for orders placed during the hospital encounter of 03/16/13 (from the past 48 hour(s))  BASIC  METABOLIC PANEL     Status: Abnormal   Collection Time    03/17/13  6:21 PM      Result Value Range   Sodium 128 (*) 135 - 145 mEq/L   Potassium 2.7 (*) 3.5 - 5.1 mEq/L   Comment: CRITICAL RESULT CALLED TO, READ BACK BY AND VERIFIED WITH:     E ALVIAR,RN 1947 03/17/13 WBOND   Chloride 88 (*) 96 - 112 mEq/L   CO2 32  19 - 32 mEq/L   Glucose, Bld 102 (*) 70 - 99 mg/dL   BUN 19  6 - 23 mg/dL   Comment: DELTA CHECK NOTED   Creatinine, Ser 0.67  0.50 - 1.35 mg/dL   Comment: DELTA CHECK NOTED   Calcium 8.3 (*) 8.4 - 10.5 mg/dL   GFR calc non Af Amer >90  >90 mL/min   GFR calc Af Amer >90  >90 mL/min   Comment: (NOTE)     The eGFR has been calculated using the CKD EPI equation.     This calculation has not been validated in all clinical situations.     eGFR's persistently <90 mL/min signify possible Chronic Kidney     Disease.  TSH     Status: None   Collection Time    03/17/13  6:21 PM      Result Value Range   TSH 0.907  0.350 - 4.500 uIU/mL   Comment: Performed at [place] [facility]     Status: Abnormal   Collection Time    03/17/13  6:21 PM      Result Value Range   Osmolality 262 (*) 275 - 300 mOsm/kg   Comment: Performed at [facility]     Status: None   Collection Time    03/17/13  6:21 PM      Result Value Range   Magnesium 1.8  1.5 - 2.5 mg/dL  WOUND CULTURE     Status: None   Collection Time    03/17/13  9:56 PM      Result Value Range   Specimen Description WOUND BACK     Special Requests SUPERFICIAL LUMBAR WOUND     Gram Stain       Value: FEW WBC PRESENT,BOTH PMN AND MONONUCLEAR     NO SQUAMOUS EPITHELIAL CELLS SEEN     NO ORGANISMS SEEN     Performed at [facility]     Performed at [facility]   Culture       Value: FEW PSEUDOMONAS AERUGINOSA     Performed at [facility]   Report Status PENDING    ANAEROBIC CULTURE     Status: None   Collection Time    03/17/13  9:56 PM      Result Value  Range   Specimen Description WOUND BACK     Special Requests SUPERFICIAL LUMBAR WOUND     Gram Stain       Value: FEW WBC PRESENT,BOTH PMN AND MONONUCLEAR     NO SQUAMOUS EPITHELIAL CELLS SEEN     NO ORGANISMS SEEN     Performed at [facility]     Performed at [facility]   Culture       Value: NO ANAEROBES ISOLATED; CULTURE IN PROGRESS FOR 5 DAYS     Performed at [facility]   Report Status PENDING    GRAM STAIN     Status: None   Collection Time    03/17/13  9:56 PM      Result Value Range   Specimen Description WOUND BACK     Special Requests SUPERFICIAL LUMBAR WOUND     Gram Stain       Value: FEW WBC PRESENT,BOTH PMN AND MONONUCLEAR     NO ORGANISMS SEEN   Report Status 03/17/2013 FINAL    OSMOLALITY, URINE     Status: Abnormal   Collection Time    03/18/13  4:16 AM      Result Value Range   Osmolality, Ur 348 (*) 390 - 1090 mOsm/kg   Comment: Performed at [place]     Status: Abnormal   Collection Time    03/18/13  4:16 AM      Result Value Range   Color, Urine YELLOW  YELLOW   APPearance CLOUDY (*) CLEAR   Specific Gravity, Urine 1.013  1.005 - 1.030   pH 6.0  5.0 - 8.0   Glucose, UA NEGATIVE  NEGATIVE mg/dL   Hgb urine dipstick NEGATIVE  NEGATIVE   Bilirubin Urine NEGATIVE  NEGATIVE   Ketones, ur NEGATIVE  NEGATIVE mg/dL   Protein, ur NEGATIVE  NEGATIVE mg/dL   Urobilinogen, UA 0.2  0.0 - 1.0 mg/dL   Nitrite NEGATIVE  NEGATIVE   Leukocytes, UA NEGATIVE  NEGATIVE   Comment: MICROSCOPIC NOT DONE ON URINES WITH NEGATIVE PROTEIN, BLOOD, LEUKOCYTES, NITRITE, OR GLUCOSE <1000 mg/dL.  BASIC METABOLIC PANEL     Status: Abnormal   Collection Time    03/18/13  6:05 AM      Result Value Range   Sodium 129 (*) 135 - 145 mEq/L   Potassium 2.9 (*) 3.5 - 5.1 mEq/L   Chloride 95 (*) 96 - 112 mEq/L   CO2  29  19 - 32 mEq/L   Glucose, Bld 102 (*) 70 - 99 mg/dL   BUN 13  6 - 23 mg/dL    Creatinine, Ser 0.53  0.50 - 1.35 mg/dL   Calcium 7.3 (*) 8.4 - 10.5 mg/dL   GFR calc non Af Amer >90  >90 mL/min   GFR calc Af Amer >90  >90 mL/min   Comment: (NOTE)     The eGFR has been calculated using the CKD EPI equation.     This calculation has not been validated in all clinical situations.     eGFR's persistently <90 mL/min signify possible Chronic Kidney     Disease.  COMPREHENSIVE METABOLIC PANEL     Status: Abnormal   Collection Time    03/18/13 11:01 PM      Result Value Range   Sodium 130 (*) 135 - 145 mEq/L   Potassium 4.8  3.5 - 5.1 mEq/L   Comment: DELTA CHECK NOTED   Chloride 95 (*) 96 - 112 mEq/L   CO2 22  19 - 32 mEq/L   Glucose, Bld 111 (*) 70 - 99 mg/dL   BUN 8  6 - 23 mg/dL   Creatinine, Ser 0.56  0.50 - 1.35 mg/dL   Calcium 8.2 (*) 8.4 - 10.5 mg/dL   Total Protein 6.0  6.0 - 8.3 g/dL   Albumin 2.7 (*) 3.5 - 5.2 g/dL   AST 31  0 - 37 U/L   Comment: HEMOLYSIS AT THIS LEVEL MAY AFFECT RESULT   ALT 18  0 - 53 U/L   Alkaline Phosphatase 104  39 - 117 U/L   Total Bilirubin 0.7  0.3 - 1.2 mg/dL   GFR calc non Af Amer >90  >90 mL/min   GFR calc Af Amer >90  >90 mL/min   Comment: (NOTE)     The eGFR has been calculated using the CKD EPI equation.     This calculation has not been validated in all clinical situations.     eGFR's persistently <90 mL/min signify possible Chronic Kidney     Disease.  BASIC METABOLIC PANEL     Status: Abnormal   Collection Time    03/19/13  6:01 AM      Result Value Range   Sodium 131 (*) 135 - 145 mEq/L   Potassium 3.7  3.5 - 5.1 mEq/L   Comment: DELTA CHECK NOTED   Chloride 95 (*) 96 - 112 mEq/L   CO2 29  19 - 32 mEq/L   Glucose, Bld 85  70 - 99 mg/dL   BUN 7  6 - 23 mg/dL   Creatinine, Ser 0.56  0.50 - 1.35 mg/dL   Calcium 8.1 (*) 8.4 - 10.5 mg/dL   GFR calc non Af Amer >90  >90 mL/min   GFR calc Af Amer >90  >90 mL/min   Comment: (NOTE)     The eGFR has been calculated using the CKD EPI equation.     This calculation  has not been validated in all clinical situations.     eGFR's persistently <90 mL/min signify possible Chronic Kidney     Disease.      Component Value Date/Time   SDES WOUND BACK 03/17/2013 2156   SDES WOUND BACK 03/17/2013 2156   SDES WOUND BACK 03/17/2013 2156   SPECREQUEST SUPERFICIAL LUMBAR WOUND 03/17/2013 2156   SPECREQUEST SUPERFICIAL LUMBAR WOUND 03/17/2013 2156   SPECREQUEST SUPERFICIAL LUMBAR WOUND 03/17/2013 2156   CULT  Value: FEW PSEUDOMONAS  AERUGINOSA Performed at [facility] 03/17/2013 2156   CULT  Value: NO ANAEROBES ISOLATED; CULTURE IN PROGRESS FOR 5 DAYS Performed at [facility] 03/17/2013 2156   REPTSTATUS PENDING 03/17/2013 2156   REPTSTATUS PENDING 03/17/2013 2156   REPTSTATUS 03/17/2013 FINAL 03/17/2013 2156   Dg Chest 2 View  03/17/2013   CLINICAL DATA:  Fever, hypotension  EXAM: CHEST  2 VIEW  COMPARISON:  07/14/2012, CT 08/11/2012  FINDINGS: Right cardiophrenic angle scarring is reidentified with stable right hemidiaphragmatic elevation. Bilateral humeral arthroplasty and lumbar fusion hardware noted. Rightward curvature of the thoracic spine centered at its mid portion is reidentified. Heart size is normal. No new pulmonary opacity. No pleural effusion.  IMPRESSION: Chronic right basilar scarring.  No new finding.   Electronically Signed   By: Conchita Paris M.D.   On: 03/17/2013 12:59   Recent Results (from the past 240 hour(s))  WOUND CULTURE     Status: None   Collection Time    03/16/13  5:36 PM      Result Value Range Status   Specimen Description WOUND   Final   Special Requests LUMBAR CULTURE   Final   Gram Stain     Final   Value: RARE WBC PRESENT, PREDOMINANTLY MONONUCLEAR     NO ORGANISMS SEEN     Performed at [facility]   Culture     Final   Value: MODERATE PSEUDOMONAS AERUGINOSA     Performed at [facility]   Report Status 03/19/2013 FINAL   Final   Organism ID, Bacteria PSEUDOMONAS AERUGINOSA   Final   URINE CULTURE     Status: None   Collection Time    03/16/13  6:01 PM      Result Value Range Status   Specimen Description URINE, CLEAN CATCH   Final   Special Requests fever   Final   Culture  Setup Time     Final   Value: 03/16/2013 18:55     Performed at [place]     Final   Value: 75,000 COLONIES/ML     Performed at [facility]   Culture     Final   Value: PSEUDOMONAS AERUGINOSA     KLEBSIELLA PNEUMONIAE     Performed at [facility]   Report Status 03/19/2013 FINAL   Final   Organism ID, Bacteria PSEUDOMONAS AERUGINOSA   Final   Organism ID, Bacteria KLEBSIELLA PNEUMONIAE   Final  WOUND CULTURE     Status: None   Collection Time    03/17/13  9:56 PM      Result Value Range Status   Specimen Description WOUND BACK   Final   Special Requests SUPERFICIAL LUMBAR WOUND   Final   Gram Stain     Final   Value: FEW WBC PRESENT,BOTH PMN AND MONONUCLEAR     NO SQUAMOUS EPITHELIAL CELLS SEEN     NO ORGANISMS SEEN     Performed at [facility]     Performed at [facility]   Culture     Final   Value: FEW PSEUDOMONAS AERUGINOSA     Performed at [facility]   Report Status PENDING   Incomplete  ANAEROBIC CULTURE     Status: None   Collection Time    03/17/13  9:56 PM      Result Value Range Status   Specimen Description WOUND BACK   Final  Special Requests SUPERFICIAL LUMBAR WOUND   Final   Gram Stain     Final   Value: FEW WBC PRESENT,BOTH PMN AND MONONUCLEAR     NO SQUAMOUS EPITHELIAL CELLS SEEN     NO ORGANISMS SEEN     Performed at [facility]     Performed at [facility]   Culture     Final   Value: NO ANAEROBES ISOLATED; CULTURE IN PROGRESS FOR 5 DAYS     Performed at [facility]   Report Status PENDING   Incomplete  GRAM STAIN     Status: None   Collection Time    03/17/13  9:56 PM      Result Value Range Status   Specimen Description WOUND BACK   Final   Special  Requests SUPERFICIAL LUMBAR WOUND   Final   Gram Stain     Final   Value: FEW WBC PRESENT,BOTH PMN AND MONONUCLEAR     NO ORGANISMS SEEN   Report Status 03/17/2013 FINAL   Final      03/19/2013, 11:48 AM     LOS: 3 days

## 2013-03-19 NOTE — Progress Notes (Signed)
I stopped in to see [name]. He had completed his cefepime and was resting quietly. Denies SOB, rash or pruritis.  Will change his cipro to cefepime.

## 2013-03-19 NOTE — Progress Notes (Signed)
Patient ID: [name], male   DOB: 06-10-1945, 67 y.o.   MRN: [id number] Febril. Wound cultures so far negative. Urine positive for psudomona and klebsiea. Decrease of lumbar wound drainage. Neuro stable. Sodium up to 131 and potassium over 4. ID to see him

## 2013-03-19 NOTE — Progress Notes (Signed)
Physical Therapy Treatment Patient Details Name: [name] MRN: [id number] DOB: [birth year] Today's Date: 03/19/2013 Time: 5361-4431 PT Time Calculation (min): 55 min  PT Assessment / Plan / Recommendation  History of Present Illness Pt is a 67 yo male who has spinal surgery 11 days ago, now presents for symptoms of post operative infection.   PT Comments   Pt received saturated in urine in bed, cognition review and strength assessed prior to activity. Patient continues to present with some disorientation as well as a significant increase in pain. Attempted bed mobility x2. Once reaching seated position patient remained EOB. Bandages appeared saturated with urine, nsg aware, pt was aware that he was wet but could not problem solve why he did not ring for nsg to be cleaned. Explained to patient importance of hygiene to prevent spread of infection. Patient ambulated to bathroom with assist and during ambulation again became incontinent again. Patient assisted with hygiene and pericare. Patient then assisted back to chair.  Patient was tachycardic during session 134HR upon returning to chair (nsg aware). Today's mobility has digressed from prior session. Will continue to follow patient and progress activity as tolerated.     Follow Up Recommendations  Home health PT;Supervision - Intermittent           Equipment Recommendations  None recommended by PT       Frequency Min 5X/week   Progress towards PT Goals Progress towards PT goals: Not progressing toward goals - comment  Plan Current plan remains appropriate    Precautions / Restrictions Precautions Precautions: Back Type of Shoulder Precautions: Pt able to state 3/3 precautions. Precaution Booklet Issued: No Precaution Comments: cues for precautions during session. Required Braces or Orthoses: Spinal Brace Spinal Brace: Lumbar corset;Applied in standing position   Pertinent Vitals/Pain 10/10 reported, HR 134      Mobility  Bed Mobility Bed Mobility: Rolling Right;Right Sidelying to Sit;Sitting - Scoot to [employer] of Bed Rolling Right: 3: Mod assist Right Sidelying to Sit: 3: Mod assist Sitting - Scoot to Edge of Bed: 3: Mod assist Details for Bed Mobility Assistance: pt requires increased assist today secondary to pain and confusion Transfers Transfers: Sit to Stand;Stand to Sit Sit to Stand: 3: Mod assist Stand to Sit: 4: Min assist Details for Transfer Assistance: Assist for elevation and stability, pt with increased pain and poor activity tolerance today compared to prior sessions Ambulation/Gait Ambulation/Gait Assistance: 4: Min assist Ambulation Distance (Feet): 24 Feet Assistive device: Rolling walker Ambulation/Gait Assistance Details: min assist for stability today Gait Pattern: Step-to pattern;Decreased stride length Gait velocity: VCs for precaution compliance and safety    Exercises Other Exercises Other Exercises: Patient performed multiple transfers for hygiene and pericare as well as drain removal.  Other Exercises: LE strength assessment and wound assessment performed in supine prior to activity. Pt with increased pain today.      PT Goals (current goals can now be found in the care plan section) Acute Rehab PT Goals Patient Stated Goal: to go to rehab PT Goal Formulation: With patient Time For Goal Achievement: 03/12/13 Potential to Achieve Goals: Good  Visit Information  Last PT Received On: 03/19/13 Assistance Needed: +1 History of Present Illness: Pt is a 67 yo male who has spinal surgery 11 days ago, now presents for symptoms of post operative infection.    Subjective Data  Subjective: pt with some confusion today Patient Stated Goal: to go to rehab   Cognition  Cognition Arousal/Alertness: Awake/alert Behavior During Therapy: Flat  affect Overall Cognitive Status: Impaired/Different from baseline Area of Impairment: Orientation;Memory Orientation Level:  Place;Time;Situation Memory: Decreased short-term memory General Comments: Pt again very confused this session, patient incontinent of urine (significant amount) remained saturated in bed, when asked why he did not call for assistance he did not know how. Patient again confused with timing and events today.     Balance  Dynamic Standing Balance Dynamic Standing - Balance Support: Bilateral upper extremity supported Dynamic Standing - Level of Assistance: 4: Min assist Dynamic Standing - Comments: assist for stability, patient   End of Session PT - End of Session Equipment Utilized During Treatment: Gait belt;Back brace Activity Tolerance: Patient limited by lethargy;Patient limited by pain;Other (comment) (confusion) Patient left: in chair;with call bell/phone within reach Nurse Communication: Mobility status   GP     [name] 03/19/2013, 1:31 PM [name], [place] DPT  [phone]

## 2013-03-19 NOTE — Progress Notes (Signed)
Occupational Therapy Treatment Patient Details Name: [name] MRN: [id number] DOB: Jul 07, 1945 Today's Date: 03/19/2013 Time: 4854-6270 OT Time Calculation (min): 33 min  OT Assessment / Plan / Recommendation  History of present illness Pt is a 67 yo male who has spinal surgery 11 days ago, now presents for symptoms of post operative infection.   OT comments  Pt currently with [name] d/c recommendations however patient will need to demonstrate higher level of self care to d/c home. Pt very confused this session and incontinent of bladder. Pt currently needs high level of care for ADLS. OT to follow closely and monitor to d/c recommendation changes.   Follow Up Recommendations  Home health OT;Supervision/Assistance - 24 hour    Barriers to Discharge       Equipment Recommendations  3 in 1 bedside comode    Recommendations for Other Services    Frequency Min 2X/week   Progress towards OT Goals Progress towards OT goals: Not progressing toward goals - comment (pt incontinent of bladder and cognitive deficits)  Plan Discharge plan remains appropriate (question if needs to be changed due to (A) at home)    Precautions / Restrictions Precautions Precautions: Back Type of Shoulder Precautions: Pt able to state 3/3 precautions. Precaution Booklet Issued: No Precaution Comments: cues for precautions Required Braces or Orthoses: Spinal Brace Spinal Brace: Lumbar corset;Applied in standing position   Pertinent Vitals/Pain 10 out 10 HR 134    ADL  Upper Body Bathing: Chest;Right arm;Left arm;Abdomen;Maximal assistance Where Assessed - Upper Body Bathing: Supported sitting Lower Body Bathing: +1 Total assistance Where Assessed - Lower Body Bathing: Supported sitting Upper Body Dressing: Maximal assistance Where Assessed - Upper Body Dressing: Supported sitting Lower Body Dressing: +1 Total assistance Where Assessed - Lower Body Dressing: Supported sitting Toilet Transfer:  Maximal assistance Toilet Transfer Method:  (pt standing to void bladder with incontinence) Toilet Transfer Equipment: Regular height toilet Equipment Used: Back brace;Gait belt;Rolling walker Transfers/Ambulation Related to ADLs: Pt verbalized need to void bladder and incontinent on the transfer to toilet . pt unable to manage gown and soiling gown. Pt noted to have soiled linen and gown on arrival. Pt needed max (A) due to need to void bladder and manage RW ADL Comments: Recommend condom cath due to frequent incontience and incr risk for infection remaining supine in soiled linen. RN notified of recommendation. PT provided adult brief at this time to sit in chair to allow bed to dry and linens to be applied once adequate to do so. Pt required full ADL due to incontinence prior to therapy arrival. Pt demonstrates cognitive deficits. Pt unaware of location or reason for admission    OT Diagnosis:    OT Problem List:   OT Treatment Interventions:     OT Goals(current goals can now be found in the care plan section) Acute Rehab OT Goals Patient Stated Goal: to go to rehab OT Goal Formulation: With patient Time For Goal Achievement: 03/24/13 Potential to Achieve Goals: Good ADL Goals Pt Will Perform Grooming: with supervision;standing Pt Will Perform Lower Body Bathing: with supervision;sit to/from stand Pt Will Perform Lower Body Dressing: with supervision;sit to/from stand Pt Will Transfer to Toilet: with supervision;ambulating Pt Will Perform Toileting - Clothing Manipulation and hygiene: with supervision;sit to/from stand Pt Will Perform Tub/Shower Transfer: with supervision;ambulating;3 in 1 Additional ADL Goal #1: Pt will generalize back precautions independently.  Visit Information  Last OT Received On: 03/19/13 Assistance Needed: +1 PT/OT/SLP Co-Evaluation/Treatment: Yes Reason for Co-Treatment: For patient/therapist  safety OT goals addressed during session: ADL's and  self-care History of Present Illness: Pt is a 67 yo male who has spinal surgery 11 days ago, now presents for symptoms of post operative infection.    Subjective Data      Prior Functioning       Cognition  Cognition Arousal/Alertness: Awake/alert Behavior During Therapy: Flat affect Overall Cognitive Status: Impaired/Different from baseline Area of Impairment: Orientation;Memory Orientation Level: Disoriented to;Place;Time;Situation Memory: Decreased short-term memory;Decreased recall of precautions General Comments: pt unaware of incontinence and urgency to void bladder    Mobility  Bed Mobility Bed Mobility: Not assessed Rolling Right: 3: Mod assist Right Sidelying to Sit: 3: Mod assist Sitting - Scoot to Edge of Bed: 3: Mod assist Details for Bed Mobility Assistance: sitting eob on arrival with PT [employer] Transfers: Sit to Stand;Stand to [employer] to Stand: 3: Mod assist Stand to Sit: 4: Min assist Details for Transfer Assistance: required elevation and (A) to static stand, pt demonstrates posterior lean in static standing    Exercises  Other Exercises Other Exercises: Patient performed multiple transfers for hygiene and pericare as well as drain removal.  Other Exercises: LE strength assessment and wound assessment performed in supine prior to activity. Pt with increased pain today.    Balance Dynamic Standing Balance Dynamic Standing - Balance Support: Bilateral upper extremity supported Dynamic Standing - Level of Assistance: 4: Min assist Dynamic Standing - Comments: assist for stability, patient    End of Session OT - End of Session Activity Tolerance: Patient tolerated treatment well Patient left: in chair;with call bell/phone within reach Nurse Communication: Mobility status;Precautions  GO     Peri Maris 03/19/2013, 1:50 PM Pager: [phone]

## 2013-03-20 DIAGNOSIS — I10 Essential (primary) hypertension: Secondary | ICD-10-CM

## 2013-03-20 DIAGNOSIS — T8140XA Infection following a procedure, unspecified, initial encounter: Principal | ICD-10-CM

## 2013-03-20 LAB — WOUND CULTURE

## 2013-03-20 LAB — SEDIMENTATION RATE: Sed Rate: 77 mm/hr — ABNORMAL HIGH (ref 0–16)

## 2013-03-20 LAB — CBC WITH DIFFERENTIAL/PLATELET
Basophils Absolute: 0 10*3/uL (ref 0.0–0.1)
Basophils Relative: 0 % (ref 0–1)
Eosinophils Absolute: 0.1 10*3/uL (ref 0.0–0.7)
Eosinophils Relative: 1 % (ref 0–5)
HCT: 22.4 % — ABNORMAL LOW (ref 39.0–52.0)
Hemoglobin: 7.5 g/dL — ABNORMAL LOW (ref 13.0–17.0)
Lymphocytes Relative: 18 % (ref 12–46)
Lymphs Abs: 1.5 10*3/uL (ref 0.7–4.0)
MCH: 30 pg (ref 26.0–34.0)
MCHC: 33.5 g/dL (ref 30.0–36.0)
MCV: 89.6 fL (ref 78.0–100.0)
Monocytes Absolute: 1.3 10*3/uL — ABNORMAL HIGH (ref 0.1–1.0)
Monocytes Relative: 16 % — ABNORMAL HIGH (ref 3–12)
Neutro Abs: 5.5 10*3/uL (ref 1.7–7.7)
Neutrophils Relative %: 65 % (ref 43–77)
Platelets: 334 10*3/uL (ref 150–400)
RBC: 2.5 MIL/uL — ABNORMAL LOW (ref 4.22–5.81)
RDW: 13.9 % (ref 11.5–15.5)
WBC: 8.4 10*3/uL (ref 4.0–10.5)

## 2013-03-20 LAB — BASIC METABOLIC PANEL
BUN: 8 mg/dL (ref 6–23)
CO2: 27 mEq/L (ref 19–32)
Calcium: 7.8 mg/dL — ABNORMAL LOW (ref 8.4–10.5)
Chloride: 96 mEq/L (ref 96–112)
Creatinine, Ser: 0.5 mg/dL (ref 0.50–1.35)
GFR calc Af Amer: >90 mL/min (ref >90)
GFR calc non Af Amer: >90 mL/min (ref >90)
Glucose, Bld: 91 mg/dL (ref 70–99)
Potassium: 4 mEq/L (ref 3.5–5.1)
Sodium: 132 mEq/L — ABNORMAL LOW (ref 135–145)

## 2013-03-20 LAB — C-REACTIVE PROTEIN: CRP: 24.1 mg/dL — ABNORMAL HIGH (ref <0.60)

## 2013-03-20 MED ORDER — SODIUM CHLORIDE 0.9 % IJ SOLN
10.0000 mL | Freq: Two times a day (BID) | INTRAMUSCULAR | Status: DC
  Administered 2013-03-20: 40 mL
  Administered 2013-03-22: 20 mL
  Administered 2013-03-23 – 2013-04-05 (×10): 10 mL

## 2013-03-20 MED ORDER — ONDANSETRON HCL 4 MG/2ML IJ SOLN
4.0000 mg | Freq: Four times a day (QID) | INTRAMUSCULAR | Status: DC
  Administered 2013-03-20 – 2013-04-01 (×45): 4 mg via INTRAVENOUS
  Filled 2013-03-20 (×45): qty 2

## 2013-03-20 MED ORDER — SODIUM CHLORIDE 0.9 % IJ SOLN
10.0000 mL | INTRAMUSCULAR | Status: DC | PRN
  Administered 2013-03-20: 20 mL
  Administered 2013-03-21 – 2013-03-25 (×5): 10 mL
  Administered 2013-04-03: 3 mL
  Administered 2013-04-06: 10 mL

## 2013-03-20 MED ORDER — METHOCARBAMOL 500 MG PO TABS
500.0000 mg | ORAL_TABLET | ORAL | Status: AC
  Administered 2013-03-20: 500 mg via ORAL
  Filled 2013-03-20: qty 1

## 2013-03-20 MED ORDER — OXYCODONE HCL ER 15 MG PO T12A
40.0000 mg | EXTENDED_RELEASE_TABLET | Freq: Two times a day (BID) | ORAL | Status: DC
  Administered 2013-03-20: 40 mg via ORAL
  Filled 2013-03-20: qty 1
  Filled 2013-03-20: qty 2

## 2013-03-20 MED ORDER — METHOCARBAMOL 500 MG PO TABS
500.0000 mg | ORAL_TABLET | Freq: Three times a day (TID) | ORAL | Status: DC | PRN
  Administered 2013-03-21 – 2013-04-05 (×11): 500 mg via ORAL
  Filled 2013-03-20 (×11): qty 1

## 2013-03-20 NOTE — Progress Notes (Signed)
Subjective: Mr. Huynh reports he is having a lot of pain: he has significant baseline pain and the wound pain is an add on.   Objective: Lab:  Recent Labs  03/20/13 0400  WBC 8.4  NEUTROABS 5.5  HGB 7.5*  HCT 22.4*  MCV 89.6  PLT 334    Recent Labs  03/17/13 1821  03/18/13 2301 03/19/13 0601 03/20/13 0400  NA 128*  < > 130* 131* 132*  K 2.7*  < > 4.8 3.7 4.0  CL 88*  < > 95* 95* 96  GLUCOSE 102*  < > 111* 85 91  BUN 19  < > 8 7 8   CREATININE 0.67  < > 0.56 0.56 0.50  CALCIUM 8.3*  < > 8.2* 8.1* 7.8*  MG 1.8  --   --   --   --   < > = values in this interval not displayed.  Imaging:  Scheduled Meds: . aspirin EC  81 mg Oral Daily  . ceFEPime (MAXIPIME) IV  1 g Intravenous Q12H  . colchicine  0.6 mg Oral BID  . docusate sodium  100 mg Oral BID  . FLUoxetine  40 mg Oral BID  . fluticasone  2 spray Each Nare Daily  . gabapentin  300 mg Oral Custom  . gabapentin  600 mg Oral BID  . heparin subcutaneous  5,000 Units Subcutaneous Q8H  . losartan  50 mg Oral Daily  . magic mouthwash  15 mL Oral TID  . OxyCODONE  20 mg Oral Q12H  . pantoprazole  40 mg Oral BID  . potassium chloride  20 mEq Oral Daily  . predniSONE  5 mg Oral Q supper  . simvastatin  40 mg Oral q1800  . sodium chloride  10-40 mL Intracatheter Q12H  . sodium chloride  3 mL Intravenous Q12H  . tiotropium  18 mcg Inhalation Daily  . traZODone  100 mg Oral QHS   Continuous Infusions: . sodium chloride    . sodium chloride 75 mL/hr at 03/17/13 0911   PRN Meds:.acetaminophen, acetaminophen, diazepam, menthol-cetylpyridinium, oxyCODONE, oxyCODONE-acetaminophen, phenol, sodium chloride, sodium chloride, zolpidem   Physical Exam: Filed Vitals:   03/20/13 0959  BP: 130/77  Pulse: 99  Temp: 98.8 F (37.1 C)  Resp: 18    Intake/Output Summary (Last 24 hours) at 03/20/13 1009 Last data filed at 03/20/13 1003  Gross per 24 hour  Intake    120 ml  Output    970 ml  Net   -850 ml   Gen'l -  ill appearing man in no acute distress Cor - RRR Pulm - normal respirations Abd- soft, BS+     Assessment/Plan: 1. NS - POD #3 - wound is stable per NS  2. ID - appreciate Dr. Algis Downs help. Patient did tolerate cefepime. PICC has been placed  3. Hyponatremia - improved, approaching normal level.  Plan Continue low rate IVF  Control pain  4. Chronic pain - h/o chronic pain due to multiple ortho issues and joint damage  Plan Resume home regimen: oxycontin 40 mg bid.   Continue OxyIR prn break through pain (d/c percocet)_   Adella Hare [facility] (o) [phone]; (c) [facility]  Tele: [phone]  03/20/2013, 10:03 AM

## 2013-03-20 NOTE — Progress Notes (Signed)
Overall stable. Patient's pain recently well-controlled. Minimal wound drainage. No radicular complaints.  Low-grade fever to 100.5. Vitals otherwise stable. Drain output very low.  Awake and alert. Oriented and appropriate. Motor sensory function stable.  Progressing reasonably well. Continue antibiotics per infectious disease recommendation.

## 2013-03-20 NOTE — Progress Notes (Addendum)
[facility] for Infectious Disease    Date of Admission:  03/16/2013   Total days of antibiotics 5        Day 2 cefepime        ( 3 days of vanco, 1 day of cipro- d/c'd)   ID: [name] is a 67 y.o. male with  with a hx of hemochromatosis, HTN COPD, RA and lumbar pain. He had undergone L4-5 fusion (2010). He returned to the hospital on 03-02-13 and underwent L1-S1 decompression and fusion now complicated by post operative PsA wound infection. Tolerating 2nd day of cefepime. POD#3 from washout/lumbar revision by botero, drain left in epidural space  Active Problems:   DYSLIPIDEMIA   PSEUDOGOUT   HYPERTENSION   CAD   VENOUS INSUFFICIENCY, LEGS   Rheumatoid arthritis(714.0)   HEMOCHROMATOSIS, HX OF   Infection, undetermined etiology   Dehydration   Hyponatremia   GERD (gastroesophageal reflux disease)   Hypokalemia    Subjective: Afebrile, still having significant back pain. He received muscle relaxants that worked for him yesterday but has not had any today. Last fever of 102 on 12/11  Medications:  . aspirin EC  81 mg Oral Daily  . ceFEPime (MAXIPIME) IV  1 g Intravenous Q12H  . colchicine  0.6 mg Oral BID  . docusate sodium  100 mg Oral BID  . FLUoxetine  40 mg Oral BID  . fluticasone  2 spray Each Nare Daily  . gabapentin  300 mg Oral Custom  . gabapentin  600 mg Oral BID  . heparin subcutaneous  5,000 Units Subcutaneous Q8H  . losartan  50 mg Oral Daily  . magic mouthwash  15 mL Oral TID  . OxyCODONE  40 mg Oral Q12H  . pantoprazole  40 mg Oral BID  . potassium chloride  20 mEq Oral Daily  . predniSONE  5 mg Oral Q supper  . simvastatin  40 mg Oral q1800  . sodium chloride  10-40 mL Intracatheter Q12H  . sodium chloride  3 mL Intravenous Q12H  . tiotropium  18 mcg Inhalation Daily  . traZODone  100 mg Oral QHS    Objective: Vital signs in last 24 hours: Temp:  [98.2 F (36.8 C)-100.5 F (38.1 C)] 98.8 F (37.1 C) (12/13 0959) Pulse Rate:   [99-117] 99 (12/13 0959) Resp:  [18-23] 18 (12/13 0959) BP: (115-167)/(68-86) 130/77 mmHg (12/13 0959) SpO2:  [89 %-98 %] 94 % (12/13 1018) Physical Exam  Constitutional: He is oriented to person, place, and time. He appears well-developed and well-nourished. In moderate distress due to pain HENT:  Mouth/Throat: Oropharynx is clear and moist. No oropharyngeal exudate.  Cardiovascular: Normal rate, regular rhythm and normal heart sounds. Exam reveals no gallop and no friction rub.  No murmur heard.  Pulmonary/Chest: Effort normal and breath sounds normal. No respiratory distress. He has no wheezes.  Abdominal: Soft. Bowel sounds are normal. He exhibits no distension. There is no tenderness.  Lymphadenopathy:  He has no cervical adenopathy.  Neurological: He is alert and oriented to person, place, and time.  Skin: Skin is warm and dry. No rash noted. No erythema.  Psychiatric: He has a normal mood and affect. His behavior is normal.    Lab Results  Recent Labs  03/19/13 0601 03/20/13 0400  WBC  --  8.4  HGB  --  7.5*  HCT  --  22.4*  NA 131* 132*  K 3.7 4.0  CL 95* 96  CO2 29  27  BUN 7 8  CREATININE 0.56 0.50   Liver Panel  Recent Labs  03/18/13 2301  PROT 6.0  ALBUMIN 2.7*  AST 31  ALT 18  ALKPHOS 104  BILITOT 0.7   Sedimentation Rate No results found for this basename: ESRSEDRATE,  in the last 72 hours C-Reactive Protein No results found for this basename: CRP,  in the last 72 hours  Microbiology: 12/9 wound cx PsA: pan sensitive 12/9 urine cx: PsA (pan sensitive) and klebsiella pneumonaie (pan S except R nitrofurantoin) 12/10 wound cx: PsA (pan sensitive)  Studies/Results: No results found.   Assessment/Plan: 68yo M with L1-S1 decompression presents with early post operative PsA infection. OR note suggests that infection may have been below fascia. Drain left at epidural space.  - recommend to treat with IV antibiotics for 6-8wks. - continue with  cefepime, appears to tolerate thus far. - will add on crp and sed rate to labs  Back pain = will give a dose of robaxin to see if can help and consider as prn.  Baxter Flattery [facility] for Infectious Diseases Cell: [phone] Pager: [phone]  03/20/2013, 12:48 PM

## 2013-03-20 NOTE — Progress Notes (Signed)
Physical Therapy Treatment Patient Details Name: CAIRO AGOSTINELLI MRN: [id number] DOB: 1946-02-14 Today's Date: 03/20/2013 Time: 4917-9150 PT Time Calculation (min): 18 min  PT Assessment / Plan / Recommendation  History of Present Illness Pt is a 67 yo male who has spinal surgery 11 days ago, now presents for symptoms of post operative infection.   PT Comments   Patient demonstrates improvements in activity tolerance and cognition compared to prior session. Patient still with significant pain, but was able to ambulate well today. Will continue to progress as tolerated. Encouraged continued ambulation with nsg.   Follow Up Recommendations  Home health PT;Supervision - Intermittent     Does the patient have the potential to tolerate intense rehabilitation     Barriers to Discharge        Equipment Recommendations  None recommended by PT    Recommendations for Other Services    Frequency Min 5X/week   Progress towards PT Goals Progress towards PT goals: Progressing toward goals  Plan Current plan remains appropriate    Precautions / Restrictions Precautions Precautions: Back Type of Shoulder Precautions: Pt able to state 3/3 precautions. Precaution Booklet Issued: No Precaution Comments: cues for precautions during session. Required Braces or Orthoses: Spinal Brace Spinal Brace: Lumbar corset;Applied in standing position   Pertinent Vitals/Pain 9/10    Mobility  Bed Mobility Bed Mobility: Rolling Right;Right Sidelying to Sit;Sitting - Scoot to Edge of Bed Rolling Right: 4: Min guard Right Sidelying to Sit: 4: Min assist Sitting - Scoot to Edge of Bed: 4: Min guard Details for Bed Mobility Assistance: imporved ability today despite significant pain Transfers Transfers: Sit to Stand;Stand to Sit Sit to Stand: 4: Min assist Stand to Sit: 4: Min assist Details for Transfer Assistance: slow with increased pain during mobility Ambulation/Gait Ambulation/Gait Assistance:  5: Supervision Ambulation Distance (Feet): 340 Feet Assistive device: Rolling walker Ambulation/Gait Assistance Details: improvements in overall stability compared to prior session.  Gait Pattern: Step-through pattern;Decreased stride length Gait velocity: decreased      PT Goals (current goals can now be found in the care plan section) Acute Rehab PT Goals Patient Stated Goal: to go to rehab PT Goal Formulation: With patient Time For Goal Achievement: 03/12/13 Potential to Achieve Goals: Good  Visit Information  Last PT Received On: 03/20/13 Assistance Needed: +1 History of Present Illness: Pt is a 67 yo male who has spinal surgery 11 days ago, now presents for symptoms of post operative infection.    Subjective Data  Subjective: pt with some confusion today Patient Stated Goal: to go to rehab   Cognition  Cognition Arousal/Alertness: Awake/alert Behavior During Therapy: Flat affect Overall Cognitive Status: Within Functional Limits for tasks assessed General Comments: significant improvement compared to prior session    Balance  Dynamic Standing Balance Dynamic Standing - Balance Support: Bilateral upper extremity supported Dynamic Standing - Level of Assistance: 4: Min assist  End of Session PT - End of Session Equipment Utilized During Treatment: Gait belt;Back brace Activity Tolerance: Patient limited by pain Patient left: in chair;with call bell/phone within reach Nurse Communication: Mobility status   GP     [name] 03/20/2013, 2:30 PM [name], [place] DPT  [phone]

## 2013-03-20 NOTE — Progress Notes (Signed)
Peripherally Inserted Central Catheter/Midline Placement  The IV Nurse has discussed with the patient and/or persons authorized to consent for the patient, the purpose of this procedure and the potential benefits and risks involved with this procedure.  The benefits include less needle sticks, lab draws from the catheter and patient may be discharged home with the catheter.  Risks include, but not limited to, infection, bleeding, blood clot (thrombus formation), and puncture of an artery; nerve damage and irregular heat beat.  Alternatives to this procedure were also discussed.  PICC/Midline Placement Documentation  PICC / Midline Single Lumen 11/20/46 PICC Right Basilic 40 cm 2 cm (Active)  Indication for Insertion or Continuance of Line Vasoactive infusions 03/20/2013  9:11 AM  Exposed Catheter (cm) 2 cm 03/20/2013  9:11 AM  Site Assessment Clean;Dry;Intact 03/20/2013  9:11 AM  Line Status Flushed;Saline locked;Blood return noted 03/20/2013  9:11 AM  Dressing Type Transparent 03/20/2013  9:11 AM  Dressing Status Clean;Dry;Intact 03/20/2013  9:11 AM  Dressing Intervention New dressing 03/20/2013  9:11 AM  Dressing Change Due 03/27/13 03/20/2013  9:11 AM       Gordan Payment 03/20/2013, 9:22 AM

## 2013-03-21 DIAGNOSIS — R259 Unspecified abnormal involuntary movements: Secondary | ICD-10-CM

## 2013-03-21 DIAGNOSIS — B965 Pseudomonas (aeruginosa) (mallei) (pseudomallei) as the cause of diseases classified elsewhere: Secondary | ICD-10-CM

## 2013-03-21 DIAGNOSIS — D649 Anemia, unspecified: Secondary | ICD-10-CM

## 2013-03-21 LAB — BASIC METABOLIC PANEL
BUN: 11 mg/dL (ref 6–23)
CO2: 28 mEq/L (ref 19–32)
Calcium: 7.6 mg/dL — ABNORMAL LOW (ref 8.4–10.5)
Chloride: 95 mEq/L — ABNORMAL LOW (ref 96–112)
Creatinine, Ser: 0.56 mg/dL (ref 0.50–1.35)
GFR calc Af Amer: >90 mL/min (ref >90)
GFR calc non Af Amer: >90 mL/min (ref >90)
Glucose, Bld: 102 mg/dL — ABNORMAL HIGH (ref 70–99)
Potassium: 4.2 mEq/L (ref 3.5–5.1)
Sodium: 130 mEq/L — ABNORMAL LOW (ref 135–145)

## 2013-03-21 LAB — GLUCOSE, CAPILLARY: Glucose-Capillary: 119 mg/dL — ABNORMAL HIGH (ref 70–99)

## 2013-03-21 LAB — HEMOGLOBIN A1C
Hgb A1c MFr Bld: 5.1 % (ref <5.7)
Mean Plasma Glucose: 100 mg/dL (ref <117)

## 2013-03-21 MED ORDER — OXYCODONE HCL ER 15 MG PO T12A: 40.0000 mg | EXTENDED_RELEASE_TABLET | Freq: Three times a day (TID) | ORAL | Status: DC

## 2013-03-21 MED ORDER — OXYCODONE HCL ER 40 MG PO T12A
40.0000 mg | EXTENDED_RELEASE_TABLET | Freq: Three times a day (TID) | ORAL | Status: DC
  Administered 2013-03-21 – 2013-04-06 (×45): 40 mg via ORAL
  Filled 2013-03-21 (×27): qty 4
  Filled 2013-03-21: qty 1
  Filled 2013-03-21 (×20): qty 4

## 2013-03-21 MED ORDER — HYDROMORPHONE HCL PF 1 MG/ML IJ SOLN
1.0000 mg | Freq: Once | INTRAMUSCULAR | Status: AC
  Administered 2013-03-21: 1 mg via INTRAVENOUS
  Filled 2013-03-21: qty 1

## 2013-03-21 MED ORDER — SODIUM CHLORIDE 0.9 % IV BOLUS (SEPSIS)
500.0000 mL | Freq: Once | INTRAVENOUS | Status: AC
  Administered 2013-03-21: 500 mL via INTRAVENOUS

## 2013-03-21 MED ORDER — OXYCODONE HCL ER 15 MG PO T12A
40.0000 mg | EXTENDED_RELEASE_TABLET | Freq: Three times a day (TID) | ORAL | Status: DC
  Administered 2013-03-21: 13:00:00 40 mg via ORAL
  Filled 2013-03-21: qty 1

## 2013-03-21 MED ORDER — OXYCODONE HCL ER 40 MG PO T12A: 40.0000 mg | EXTENDED_RELEASE_TABLET | Freq: Three times a day (TID) | ORAL | Status: DC

## 2013-03-21 NOTE — Progress Notes (Signed)
Subjective: Reviewed ID and NS notes - no changes in therapy.  [name] reports he is still in a lot of pain.  Objective: Lab:  Recent Labs  03/20/13 0400  WBC 8.4  NEUTROABS 5.5  HGB 7.5*  HCT 22.4*  MCV 89.6  PLT 334    Recent Labs  03/19/13 0601 03/20/13 0400 03/21/13 0432  NA 131* 132* 130*  K 3.7 4.0 4.2  CL 95* 96 95*  GLUCOSE 85 91 102*  BUN 7 8 11   CREATININE 0.56 0.50 0.56  CALCIUM 8.1* 7.8* 7.6*    Imaging:  Scheduled Meds: . aspirin EC  81 mg Oral Daily  . ceFEPime (MAXIPIME) IV  1 g Intravenous Q12H  . colchicine  0.6 mg Oral BID  . docusate sodium  100 mg Oral BID  . FLUoxetine  40 mg Oral BID  . fluticasone  2 spray Each Nare Daily  . gabapentin  300 mg Oral Custom  . gabapentin  600 mg Oral BID  . heparin subcutaneous  5,000 Units Subcutaneous Q8H  . losartan  50 mg Oral Daily  . magic mouthwash  15 mL Oral TID  . ondansetron (ZOFRAN) IV  4 mg Intravenous Q6H  . OxyCODONE  40 mg Oral Q12H  . pantoprazole  40 mg Oral BID  . potassium chloride  20 mEq Oral Daily  . predniSONE  5 mg Oral Q supper  . simvastatin  40 mg Oral q1800  . sodium chloride  10-40 mL Intracatheter Q12H  . sodium chloride  3 mL Intravenous Q12H  . tiotropium  18 mcg Inhalation Daily  . traZODone  100 mg Oral QHS   Continuous Infusions: . sodium chloride    . sodium chloride 50 mL/hr at 03/20/13 1045   PRN Meds:.acetaminophen, acetaminophen, diazepam, menthol-cetylpyridinium, methocarbamol, oxyCODONE, phenol, sodium chloride, sodium chloride, zolpidem   Physical Exam: Filed Vitals:   03/21/13 0921  BP: 103/68  Pulse: 106  Temp: 98.9 F (37.2 C)  Resp: 18   gen'l - spirits are ok Cor - RRR Pulm - normal respirations     Assessment/Plan: 1. NS - POD #4 - stable. Drains to come out  2. ID - PsA by culture. Day #2/42 cefepime- which he is tolerating.  3. Hyponatremia - Na 130. Plan  continue low flow IVF  F/u Bmet in AM  4. Pain mgt: acute on  chronic Plan Increase Oxycontin to tid  5. Anemia Hgb to 7.5. He is asymptomatic. Transfusion threshold of 7g Plan H/h in AM  G. L. Garcia (o) [phone]; (c) [facility]  Tele: [phone]  03/21/2013, 11:18 AM

## 2013-03-21 NOTE — Progress Notes (Signed)
Called Dr.Poole with decreased BP readings this evening; scheduled and prn medications are contributing factor by the afternoon; patient arousable, alert and oriented.  Has rated his pain high threw out the day and now restful after prn valium given for muscle spasms.  Orders received to give 547m normal saline bolus and repeat BP.  PRN medications to be given cautiously until BP rises; no acute distress.  No neuro changes. No fever.  Encouraged patient to cough, deep breath.  Wife at bedside, concerned Oxycontin is not controlling his pain; states, he did better with po dilaudid post op.  No changes to pain management at this time due to BP.

## 2013-03-21 NOTE — Progress Notes (Signed)
Patient ID: [name], male   DOB: 09/15/1945, 67 y.o.   MRN: [id number] Vital signs are stable. Patient feels pain is coming under good control finally. Drains are been removed. Patient feels some improvement. Continue antibiotics.

## 2013-03-22 ENCOUNTER — Encounter (HOSPITAL_COMMUNITY): Payer: Self-pay | Admitting: General Practice

## 2013-03-22 ENCOUNTER — Inpatient Hospital Stay (HOSPITAL_COMMUNITY): Payer: Medicare Other

## 2013-03-22 DIAGNOSIS — B9689 Other specified bacterial agents as the cause of diseases classified elsewhere: Secondary | ICD-10-CM

## 2013-03-22 LAB — HEMOGLOBIN AND HEMATOCRIT, BLOOD
HCT: 22.3 % — ABNORMAL LOW (ref 39.0–52.0)
Hemoglobin: 7.3 g/dL — ABNORMAL LOW (ref 13.0–17.0)

## 2013-03-22 LAB — BASIC METABOLIC PANEL
BUN: 11 mg/dL (ref 6–23)
CO2: 25 mEq/L (ref 19–32)
Calcium: 7.4 mg/dL — ABNORMAL LOW (ref 8.4–10.5)
Chloride: 96 mEq/L (ref 96–112)
Creatinine, Ser: 0.55 mg/dL (ref 0.50–1.35)
GFR calc Af Amer: >90 mL/min (ref >90)
GFR calc non Af Amer: >90 mL/min (ref >90)
Glucose, Bld: 109 mg/dL — ABNORMAL HIGH (ref 70–99)
Potassium: 4.3 mEq/L (ref 3.5–5.1)
Sodium: 129 mEq/L — ABNORMAL LOW (ref 135–145)

## 2013-03-22 LAB — ANAEROBIC CULTURE

## 2013-03-22 MED ORDER — ENSURE COMPLETE PO LIQD
237.0000 mL | Freq: Two times a day (BID) | ORAL | Status: DC
  Administered 2013-03-22 – 2013-04-06 (×27): 237 mL via ORAL

## 2013-03-22 MED ORDER — ADULT MULTIVITAMIN W/MINERALS CH
1.0000 | ORAL_TABLET | Freq: Every day | ORAL | Status: DC
  Administered 2013-03-22 – 2013-04-06 (×16): 1 via ORAL
  Filled 2013-03-22 (×16): qty 1

## 2013-03-22 MED ORDER — HYDROMORPHONE HCL 2 MG PO TABS
1.0000 mg | ORAL_TABLET | ORAL | Status: DC | PRN
  Administered 2013-03-22 – 2013-04-03 (×5): 1 mg via ORAL
  Filled 2013-03-22 (×7): qty 1

## 2013-03-22 NOTE — Progress Notes (Signed)
Occupational Therapy Treatment Patient Details Name: [name] MRN: [id number] DOB: 09-27-1945 Today's Date: 03/22/2013 Time: 9201-0071 OT Time Calculation (min): 26 min  OT Assessment / Plan / Recommendation  History of present illness Pt is a 67 yo male who has spinal surgery 11 days ago, now presents for symptoms of post operative infection.   OT comments  Pt progressing toward goals and demonstrates incr control of bladder this session compared to previous sessions. Pt needed (A) to complete bed mobility. Pt needed cues to don brace properly adn remains with some short term memory deficits at this time.    Follow Up Recommendations  Home health OT;Supervision - Intermittent    Barriers to Discharge       Equipment Recommendations  3 in 1 bedside comode    Recommendations for Other Services    Frequency Min 2X/week   Progress towards OT Goals Progress towards OT goals: Progressing toward goals  Plan Discharge plan remains appropriate    Precautions / Restrictions Precautions Precautions: Back Precaution Comments: cues for donning back brace Required Braces or Orthoses: Spinal Brace Spinal Brace: Lumbar corset;Applied in standing position   Pertinent Vitals/Pain Rash on back, buttock and medial aspect of thighs    ADL  Grooming: Wash/dry hands;Wash/dry face;Min guard Where Assessed - Grooming: Unsupported standing Upper Body Bathing: Chest;Right arm;Left arm;Abdomen;Min guard Where Assessed - Upper Body Bathing: Unsupported sitting Upper Body Dressing: Min guard Where Assessed - Upper Body Dressing: Unsupported sitting Toilet Transfer: Min Psychiatric nurse Method: Sit to Loss adjuster, chartered: Regular height toilet Toileting - Clothing Manipulation and Hygiene: Min guard Where Assessed - Best boy and Hygiene: Standing Equipment Used: Back brace;Rolling walker Transfers/Ambulation Related to ADLs: Pt ambulating with RW  decr gait velocity MIn guard (A)  ADL Comments: Pt supine and agreeable to OOB to sink level. Pt needed cues to don brace. Pt is unable to internally rotate Lt UE to don brace without (A). pt unaware if brace is don correctly. Pt ambulating to bathroom and static standing to void. Pt noted to have rash along edges of incision tape, buttock and  medial aspects of bil thighs. RN Hinton Dyer called to room to visualize the rash which appears to be in larger area than previous RN report to [employer]. Pt provide antifungal powder and aleo lotion to help decr ithcing and prevent pt from scratching. Pt verbalizes that it itches and then burns when he rubs it. Pt completed UB bathing only and (A)ed with posterior bathing. Pt positioned in chair for OOB.    OT Diagnosis:    OT Problem List:   OT Treatment Interventions:     OT Goals(current goals can now be found in the care plan section) Acute Rehab OT Goals Patient Stated Goal: to go to rehab OT Goal Formulation: With patient Time For Goal Achievement: 03/24/13 Potential to Achieve Goals: Good ADL Goals Pt Will Perform Grooming: with supervision;standing Pt Will Perform Lower Body Bathing: with supervision;sit to/from stand Pt Will Perform Lower Body Dressing: with supervision;sit to/from stand Pt Will Transfer to Toilet: with supervision;ambulating Pt Will Perform Toileting - Clothing Manipulation and hygiene: with supervision;sit to/from stand Pt Will Perform Tub/Shower Transfer: with supervision;ambulating;3 in 1 Additional ADL Goal #1: Pt will generalize back precautions independently.  Visit Information  Last OT Received On: 03/22/13 Assistance Needed: +1 History of Present Illness: Pt is a 67 yo male who has spinal surgery 11 days ago, now presents for symptoms of post operative infection.  Subjective Data      Prior Functioning       Cognition  Cognition Arousal/Alertness: Awake/alert Behavior During Therapy: WFL for tasks  assessed/performed Overall Cognitive Status: Impaired/Different from baseline Area of Impairment: Memory Memory: Decreased short-term memory    Mobility  Bed Mobility Bed Mobility: Supine to Sit;Sitting - Scoot to [employer] of Bed Rolling Right: 4: Min assist Right Sidelying to Sit: 4: Min assist;HOB flat Supine to Sit: 4: Min guard;HOB flat Sitting - Scoot to [employer] of Bed: 4: Min guard Details for Bed Mobility Assistance: Pt needed cues for hand placement and (A) to keep proper alignment Transfers Transfers: Sit to Stand;Stand to Sit Sit to Stand: 4: Min guard;With upper extremity assist;From bed Stand to Sit: 4: Min guard;With upper extremity assist;To chair/3-in-1 Details for Transfer Assistance: cues for hand placement    Exercises      Balance     End of Session OT - End of Session Activity Tolerance: Patient tolerated treatment well Patient left: in chair;with call bell/phone within reach Nurse Communication: Mobility status;Precautions  GO     Peri Maris 03/22/2013, 9:25 AM Pager: [phone]

## 2013-03-22 NOTE — Progress Notes (Signed)
Subjective: [name] reports that his pain is better controlled on tid oxycontin. Worried about dispo - had a bad experience at [facility].  Objective: Lab:  Recent Labs  03/20/13 0400 03/22/13 0449  WBC 8.4  --   NEUTROABS 5.5  --   HGB 7.5* 7.3*  HCT 22.4* 22.3*  MCV 89.6  --   PLT 334  --     Recent Labs  03/20/13 0400 03/21/13 0432 03/22/13 0449  NA 132* 130* 129*  K 4.0 4.2 4.3  CL 96 95* 96  GLUCOSE 91 102* 109*  BUN 8 11 11   CREATININE 0.50 0.56 0.55  CALCIUM 7.8* 7.6* 7.4*    Imaging:  Scheduled Meds: . aspirin EC  81 mg Oral Daily  . ceFEPime (MAXIPIME) IV  1 g Intravenous Q12H  . colchicine  0.6 mg Oral BID  . docusate sodium  100 mg Oral BID  . FLUoxetine  40 mg Oral BID  . fluticasone  2 spray Each Nare Daily  . gabapentin  300 mg Oral Custom  . gabapentin  600 mg Oral BID  . heparin subcutaneous  5,000 Units Subcutaneous Q8H  . losartan  50 mg Oral Daily  . magic mouthwash  15 mL Oral TID  . ondansetron (ZOFRAN) IV  4 mg Intravenous Q6H  . OxyCODONE  40 mg Oral Q8H  . pantoprazole  40 mg Oral BID  . potassium chloride  20 mEq Oral Daily  . predniSONE  5 mg Oral Q supper  . simvastatin  40 mg Oral q1800  . sodium chloride  10-40 mL Intracatheter Q12H  . sodium chloride  3 mL Intravenous Q12H  . tiotropium  18 mcg Inhalation Daily  . traZODone  100 mg Oral QHS   Continuous Infusions: . sodium chloride    . sodium chloride 50 mL/hr at 03/21/13 2200   PRN Meds:.acetaminophen, acetaminophen, diazepam, menthol-cetylpyridinium, methocarbamol, oxyCODONE, phenol, sodium chloride, sodium chloride, zolpidem   Physical Exam: Filed Vitals:   03/22/13 0700  BP: 92/70  Pulse: 99  Temp: 97.9  Resp: 18    Intake/Output Summary (Last 24 hours) at 03/22/13 0710 Last data filed at 03/22/13 0300  Gross per 24 hour  Intake    240 ml  Output    350 ml  Net   -110 ml   Total this adm: -587 gen'l - chronically ill appearing man in no acute  distress HEETN- C&S clear Cor - RRR Pulm - normal respirations Neuro - awake and alert, speech clear. Did not stand or ambulate    Assessment/Plan: 1. NS POD#5 - is working with PT.  2. ID - Day #3/42 cefrepime.  3. Hyponatremia - hypovolemic hyponatremia Plan  Chg IVF to NS at 75 cc/hr  Bmet in AM  4. Pain mgt - on oxycontin 40 mg tid. Should see better control Plan Continue present dose of oxycontin  Continue gabapentin  Add dilaudid 1 mg po q4 prn breakthrough pain  5. Anemia - Hgb 7.3 (7.5) - not to transfusion threshold. Asymptomatic.    Adella Hare [facility] (o) [phone]; (c) [place]  Tele: [phone]  03/22/2013, 7:09 AM

## 2013-03-22 NOTE — Progress Notes (Addendum)
Physical Therapy Treatment Patient Details Name: [name] MRN: [id number] DOB: September 26, 1945 Today's Date: 03/22/2013 Time: 2202-5427 PT Time Calculation (min): 61 min  PT Assessment / Plan / Recommendation  History of Present Illness Pt is a 67 yo male who has spinal surgery 11 days ago, now presents for symptoms of post operative infection.   PT Comments   Patient with improved activity tolerance today but continues to demonstrate difficulty with bed mobility and transfers.  Spoke with patient and spouse at length regarding current mobility levels, expectations for mobility, and safety concerns. At this time, patient demonstrates some aspects of mobility at supervision levels but with minimal fatigue begins to require assist for mobility. Patient has had multiple bouts of incontinence in bed and given precautions, patient has been unable to manage self care.  This therapist is hopeful for improved function to possibly discharge home with home services, but given generalized weakness and deconditioning in conjunction with limited mobility and lack of available assist at home (wife works), may need to start considering return to [facility] upon discharge.  Explained recommendations to spouse and patient with understanding of their reluctance related to prior poor SNF experience, they were in agreement that this may need to be considered. Will continue to work with patient daily to progress as tolerated with hopes for dc home, but at this point, feel SNF will be safest option upon discharge.       Follow Up Recommendations  SNF;Supervision - Intermittent           Equipment Recommendations  None recommended by PT       Frequency Min 5X/week   Progress towards PT Goals Progress towards PT goals: Progressing toward goals (modestly)  Plan Discharge plan needs to be updated    Precautions / Restrictions Precautions Precautions: Back Precaution Comments: cues for donning back  brace Required Braces or Orthoses: Spinal Brace Spinal Brace: Lumbar corset;Applied in standing position   Pertinent Vitals/Pain 7/10    Mobility  Bed Mobility Bed Mobility: Supine to Sit;Sitting - Scoot to [employer] of Bed Rolling Right: 4: Min assist Right Sidelying to Sit: 4: Min assist;HOB flat Supine to Sit: 4: Min guard;HOB flat Sitting - Scoot to [employer] of Bed: 4: Min guard Details for Bed Mobility Assistance: Patient continues to need assist for mobility and cues for positioning Transfers Transfers: Sit to Stand;Stand to Sit Sit to Stand: 4: Min assist Stand to Sit: 4: Min guard Details for Transfer Assistance: assist to elevate from chair, poor understanding and execution of cues for hand placement and use of RW.  Ambulation/Gait Ambulation/Gait Assistance: 5: Supervision;4: Min assist Ambulation Distance (Feet): 620 Feet Assistive device: Rolling walker Ambulation/Gait Assistance Details: occassional need for assist as patient became fatigued, poor management of assistive device.  Gait Pattern: Step-through pattern;Decreased stride length Gait velocity: decreased    Exercises Other Exercises Other Exercises: transfer training from bed to commode, performed x4 with cues and assist     PT Goals (current goals can now be found in the care plan section) Acute Rehab PT Goals Patient Stated Goal: to go to rehab PT Goal Formulation: With patient Time For Goal Achievement: 03/27/13 Potential to Achieve Goals: Good  Visit Information  Last PT Received On: 03/22/13 Assistance Needed: +1 History of Present Illness: Pt is a 67 yo male who has spinal surgery 11 days ago, now presents for symptoms of post operative infection.    Subjective Data  Subjective: I just feel weak today Patient Stated  Goal: to go to rehab   Cognition  Cognition Arousal/Alertness: Awake/alert Behavior During Therapy: WFL for tasks assessed/performed Overall Cognitive Status: Impaired/Different from  baseline Area of Impairment: Memory Memory: Decreased short-term memory    Balance  Dynamic Standing Balance Dynamic Standing - Balance Support: Bilateral upper extremity supported Dynamic Standing - Level of Assistance: 4: Min assist Dynamic Standing - Comments: difficulty maintaining upright as pt fatigued.  End of Session PT - End of Session Equipment Utilized During Treatment: Gait belt;Back brace Activity Tolerance: Patient limited by pain Patient left: in chair;with call bell/phone within reach Nurse Communication: Mobility status patient assisted with pericare, nsg advised of yeast odor and rash outbreak.    GP     [name] 03/22/2013, 5:10 PM [name], [place] DPT  [phone]

## 2013-03-22 NOTE — Progress Notes (Signed)
Subjective: Patient reports Pain under moderate control. Patient is taking a diet today.  Objective: Vital signs in last 24 hours: Temp:  [97.6 F (36.4 C)-98.6 F (37 C)] 98.4 F (36.9 C) (12/15 1334) Pulse Rate:  [91-112] 98 (12/15 1334) Resp:  [18] 18 (12/15 1334) BP: (70-120)/(32-88) 105/70 mmHg (12/15 1334) SpO2:  [88 %-99 %] 96 % (12/15 1334)  Intake/Output from previous day: 12/14 0701 - 12/15 0700 In: 240 [P.O.:240] Out: 350 [Urine:350] Intake/Output this shift: Total I/O In: 360 [P.O.:360] Out: -   Incision is reddened with some moderate drainage. Dressing changes are being applied. Motor function appears intact in lower extremities.  Lab Results:  Recent Labs  03/20/13 0400 03/22/13 0449  WBC 8.4  --   HGB 7.5* 7.3*  HCT 22.4* 22.3*  PLT 334  --    BMET  Recent Labs  03/21/13 0432 03/22/13 0449  NA 130* 129*  K 4.2 4.3  CL 95* 96  CO2 28 25  GLUCOSE 102* 109*  BUN 11 11  CREATININE 0.56 0.55  CALCIUM 7.6* 7.4*    Studies/Results: Dg Chest Port 1 View  03/22/2013   CLINICAL DATA:  Congestion and cough.  EXAM: PORTABLE CHEST - 1 VIEW  COMPARISON:  03/17/2013  FINDINGS: A PICC line has been placed via the right arm with the catheter tip in the lower SVC. There is increased volume loss of the right lung without focal airspace consolidation. Scarring remains at the left lung base. No edema or pleural fluid is identified. The heart size is stable. Stable degenerative changes are present in the thoracic spine.  IMPRESSION: Interval PICC line placement with tip in SVC. Lower volume of right lung. No focal infiltrate is identified.   Electronically Signed   By: Aletta Edouard M.D.   On: 03/22/2013 08:19    Assessment/Plan: Wound infection status post debridement. Tolerating antibiotics.  LOS: 6 days  Increase frequency of dressing changes. Paint incision with Betadine   Sahithi Ordoyne J 03/22/2013, 1:57 PM

## 2013-03-22 NOTE — Progress Notes (Signed)
INFECTIOUS DISEASE PROGRESS NOTE  ID: [name] is a 67 y.o. male with  Active Problems:   DYSLIPIDEMIA   PSEUDOGOUT   HYPERTENSION   CAD   VENOUS INSUFFICIENCY, LEGS   Rheumatoid arthritis(714.0)   HEMOCHROMATOSIS, HX OF   Infection, undetermined etiology   Dehydration   Hyponatremia   GERD (gastroesophageal reflux disease)   Hypokalemia  Subjective: C/o back pain. C/o itching on back. States he has rash on his back.   Abtx:  Anti-infectives   Start     Dose/Rate Route Frequency Ordered Stop   03/19/13 2200  ceFEPIme (MAXIPIME) 1 g in dextrose 5 % 50 mL IVPB     1 g 100 mL/hr over 30 Minutes Intravenous Every 12 hours 03/19/13 1525     03/19/13 1330  ceFEPIme (MAXIPIME) 1 g in dextrose 5 % 50 mL IVPB     1 g 100 mL/hr over 30 Minutes Intravenous  Once 03/19/13 1225 03/19/13 1412   03/19/13 1000  ciprofloxacin (CIPRO) IVPB 400 mg  Status:  Discontinued     400 mg 200 mL/hr over 60 Minutes Intravenous Every 8 hours 03/19/13 0859 03/19/13 1525   03/18/13 0000  vancomycin (VANCOCIN) IVPB 750 mg/150 ml premix  Status:  Discontinued     750 mg 150 mL/hr over 60 Minutes Intravenous Every 12 hours 03/17/13 1326 03/19/13 0859   03/16/13 1800  vancomycin (VANCOCIN) IVPB 750 mg/150 ml premix  Status:  Discontinued     750 mg 150 mL/hr over 60 Minutes Intravenous Every 8 hours 03/16/13 1754 03/17/13 1326      Medications:  Scheduled: . aspirin EC  81 mg Oral Daily  . ceFEPime (MAXIPIME) IV  1 g Intravenous Q12H  . colchicine  0.6 mg Oral BID  . docusate sodium  100 mg Oral BID  . feeding supplement (ENSURE COMPLETE)  237 mL Oral BID BM  . FLUoxetine  40 mg Oral BID  . fluticasone  2 spray Each Nare Daily  . gabapentin  300 mg Oral Custom  . gabapentin  600 mg Oral BID  . heparin subcutaneous  5,000 Units Subcutaneous Q8H  . losartan  50 mg Oral Daily  . magic mouthwash  15 mL Oral TID  . multivitamin with minerals  1 tablet Oral Daily  . ondansetron (ZOFRAN)  IV  4 mg Intravenous Q6H  . OxyCODONE  40 mg Oral Q8H  . pantoprazole  40 mg Oral BID  . potassium chloride  20 mEq Oral Daily  . predniSONE  5 mg Oral Q supper  . simvastatin  40 mg Oral q1800  . sodium chloride  10-40 mL Intracatheter Q12H  . sodium chloride  3 mL Intravenous Q12H  . tiotropium  18 mcg Inhalation Daily  . traZODone  100 mg Oral QHS    Objective: Vital signs in last 24 hours: Temp:  [97.6 F (36.4 C)-98.6 F (37 C)] 97.8 F (36.6 C) (12/15 0449) Pulse Rate:  [91-118] 91 (12/15 0449) Resp:  [18] 18 (12/15 0449) BP: (70-120)/(32-88) 92/70 mmHg (12/15 0700) SpO2:  [88 %-99 %] 99 % (12/15 0817)   General appearance: alert, cooperative and no distress Resp: clear to auscultation bilaterally Cardio: regular rate and rhythm GI: normal findings: bowel sounds normal and soft, non-tender Skin: i did not see a rash on his back or his RLE.   Lab Results  Recent Labs  03/20/13 0400 03/21/13 0432 03/22/13 0449  WBC 8.4  --   --   HGB  7.5*  --  7.3*  HCT 22.4*  --  22.3*  NA 132* 130* 129*  K 4.0 4.2 4.3  CL 96 95* 96  CO2 27 28 25   BUN 8 11 11   CREATININE 0.50 0.56 0.55   Liver Panel No results found for this basename: PROT, ALBUMIN, AST, ALT, ALKPHOS, BILITOT, BILIDIR, IBILI,  in the last 72 hours Sedimentation Rate  Recent Labs  03/20/13 1700  ESRSEDRATE 77*   C-Reactive Protein  Recent Labs  03/20/13 1700  CRP 24.1*    Microbiology: Recent Results (from the past 240 hour(s))  WOUND CULTURE     Status: None   Collection Time    03/16/13  5:36 PM      Result Value Range Status   Specimen Description WOUND   Final   Special Requests LUMBAR CULTURE   Final   Gram Stain     Final   Value: RARE WBC PRESENT, PREDOMINANTLY MONONUCLEAR     NO ORGANISMS SEEN     Performed at [facility]   Culture     Final   Value: MODERATE PSEUDOMONAS AERUGINOSA     Performed at [facility]   Report Status 03/19/2013 FINAL   Final    Organism ID, Bacteria PSEUDOMONAS AERUGINOSA   Final  URINE CULTURE     Status: None   Collection Time    03/16/13  6:01 PM      Result Value Range Status   Specimen Description URINE, CLEAN CATCH   Final   Special Requests fever   Final   Culture  Setup Time     Final   Value: 03/16/2013 18:55     Performed at [facility]     Final   Value: 75,000 COLONIES/ML     Performed at [facility]   Culture     Final   Value: PSEUDOMONAS AERUGINOSA     KLEBSIELLA PNEUMONIAE     Performed at [facility]   Report Status 03/19/2013 FINAL   Final   Organism ID, Bacteria PSEUDOMONAS AERUGINOSA   Final   Organism ID, Bacteria KLEBSIELLA PNEUMONIAE   Final  WOUND CULTURE     Status: None   Collection Time    03/17/13  9:56 PM      Result Value Range Status   Specimen Description WOUND BACK   Final   Special Requests SUPERFICIAL LUMBAR WOUND   Final   Gram Stain     Final   Value: FEW WBC PRESENT,BOTH PMN AND MONONUCLEAR     NO SQUAMOUS EPITHELIAL CELLS SEEN     NO ORGANISMS SEEN     Performed at [facility]     Performed at [facility]   Culture     Final   Value: FEW PSEUDOMONAS AERUGINOSA     Performed at [facility]   Report Status 03/20/2013 FINAL   Final   Organism ID, Bacteria PSEUDOMONAS AERUGINOSA   Final  ANAEROBIC CULTURE     Status: None   Collection Time    03/17/13  9:56 PM      Result Value Range Status   Specimen Description WOUND BACK   Final   Special Requests SUPERFICIAL LUMBAR WOUND   Final   Gram Stain     Final   Value: FEW WBC PRESENT,BOTH PMN AND MONONUCLEAR     NO SQUAMOUS EPITHELIAL CELLS SEEN     NO ORGANISMS SEEN  Performed at [facility]     Performed at [facility]   Culture     Final   Value: NO ANAEROBES ISOLATED; CULTURE IN PROGRESS FOR 5 DAYS     Performed at [facility]   Report Status PENDING   Incomplete  GRAM STAIN     Status: None   Collection  Time    03/17/13  9:56 PM      Result Value Range Status   Specimen Description WOUND BACK   Final   Special Requests SUPERFICIAL LUMBAR WOUND   Final   Gram Stain     Final   Value: FEW WBC PRESENT,BOTH PMN AND MONONUCLEAR     NO ORGANISMS SEEN   Report Status 03/17/2013 FINAL   Final    Studies/Results: Dg Chest Port 1 View  03/22/2013   CLINICAL DATA:  Congestion and cough.  EXAM: PORTABLE CHEST - 1 VIEW  COMPARISON:  03/17/2013  FINDINGS: A PICC line has been placed via the right arm with the catheter tip in the lower SVC. There is increased volume loss of the right lung without focal airspace consolidation. Scarring remains at the left lung base. No edema or pleural fluid is identified. The heart size is stable. Stable degenerative changes are present in the thoracic spine.  IMPRESSION: Interval PICC line placement with tip in SVC. Lower volume of right lung. No focal infiltrate is identified.   Electronically Signed   By: Aletta Edouard M.D.   On: 03/22/2013 08:19     Assessment/Plan: Lumbar wound infection with hardware (pseudomonas, klebsiella) Hx of PEN allergy  Thrush  Total days of antibiotics: 7 (Cefepime)  He appears to be tolerating the cefepime. I would suggest that his back rash is heat related  From being in bed.  Will see him back in ID clinic in 2-3 weeks.  Available if questions.          Bobby Rumpf Infectious Diseases (pager) [phone] [email or web address] 03/22/2013, 12:08 PM  LOS: 6 days

## 2013-03-22 NOTE — Progress Notes (Signed)
INITIAL NUTRITION ASSESSMENT  DOCUMENTATION CODES Per approved criteria  -Not Applicable   INTERVENTION: - Ensure Complete po BID, each supplement provides 350 kcal and 13 grams of protein - Will add multivitamin  NUTRITION DIAGNOSIS: Inadequate oral intake related to pain as evidenced by reported intake less than estimated needs.   Goal: Pt to meet >/= 90% of their estimated nutrition needs   Monitor:  Weight, po intake, labs, wound healing  Reason for Assessment: MST  67 y.o. male  Admitting Dx: <principal problem not specified>  ASSESSMENT: 67 y.o. White male admitted for fever, wound infection. Had lumbar fusion 11 days ago. BMET yesterday showed sodium 120. Normal a week ago. Hospitalists consulted today for hyponatremia. Pt reports poor appetite, intermittent vomiting since surgery.  Pt reported that he has not been eating well due to discomfort. He reported his usual body weight as 148-150 lbs. Pt says that he is eating less than 50% of his usual intake for > 1 week. Will be sent Ensure Complete to increase calories and protein in diet as well as promote wound healing.   Height: Ht Readings from Last 1 Encounters:  03/16/13 5' 6.14" (1.68 m)    Weight: Wt Readings from Last 1 Encounters:  03/16/13 158 lb 4.6 oz (71.8 kg)    Ideal Body Weight: 59.6 kg  % Ideal Body Weight: 120%  Wt Readings from Last 10 Encounters:  03/16/13 158 lb 4.6 oz (71.8 kg)  03/16/13 158 lb 4.6 oz (71.8 kg)  03/02/13 158 lb 4.6 oz (71.8 kg)  03/02/13 158 lb 4.6 oz (71.8 kg)  02/23/13 150 lb (68.04 kg)  01/18/13 150 lb (68.04 kg)  12/15/12 151 lb (68.493 kg)  10/05/12 156 lb 6.4 oz (70.943 kg)  10/02/12 158 lb (71.668 kg)  09/03/12 154 lb 9.6 oz (70.126 kg)    Usual Body Weight: 148-150 lbs  % Usual Body Weight: 105%  BMI:  Body mass index is 25.44 kg/(m^2).  Estimated Nutritional Needs: Kcal: 1800-2150 kcal Protein: 95-110 g Fluid: 1.8-2.2 L  Skin: Incision on back,  infection  Diet Order: Cardiac  EDUCATION NEEDS: -No education needs identified at this time   Intake/Output Summary (Last 24 hours) at 03/22/13 1123 Last data filed at 03/22/13 0854  Gross per 24 hour  Intake    480 ml  Output      0 ml  Net    480 ml    Last BM: 12/13   Labs:   Recent Labs Lab 03/17/13 1821  03/20/13 0400 03/21/13 0432 03/22/13 0449  NA 128*  < > 132* 130* 129*  K 2.7*  < > 4.0 4.2 4.3  CL 88*  < > 96 95* 96  CO2 32  < > 27 28 25   BUN 19  < > 8 11 11   CREATININE 0.67  < > 0.50 0.56 0.55  CALCIUM 8.3*  < > 7.8* 7.6* 7.4*  MG 1.8  --   --   --   --   GLUCOSE 102*  < > 91 102* 109*  < > = values in this interval not displayed.  CBG (last 3)   Recent Labs  03/21/13 1815  GLUCAP 119*    Scheduled Meds: . aspirin EC  81 mg Oral Daily  . ceFEPime (MAXIPIME) IV  1 g Intravenous Q12H  . colchicine  0.6 mg Oral BID  . docusate sodium  100 mg Oral BID  . FLUoxetine  40 mg Oral BID  . fluticasone  2 spray Each Nare Daily  . gabapentin  300 mg Oral Custom  . gabapentin  600 mg Oral BID  . heparin subcutaneous  5,000 Units Subcutaneous Q8H  . losartan  50 mg Oral Daily  . magic mouthwash  15 mL Oral TID  . ondansetron (ZOFRAN) IV  4 mg Intravenous Q6H  . OxyCODONE  40 mg Oral Q8H  . pantoprazole  40 mg Oral BID  . potassium chloride  20 mEq Oral Daily  . predniSONE  5 mg Oral Q supper  . simvastatin  40 mg Oral q1800  . sodium chloride  10-40 mL Intracatheter Q12H  . sodium chloride  3 mL Intravenous Q12H  . tiotropium  18 mcg Inhalation Daily  . traZODone  100 mg Oral QHS    Continuous Infusions: . sodium chloride    . sodium chloride 75 mL/hr at 03/22/13 1009    Past Medical History  Diagnosis Date  . Hx of colonic polyps   . Diverticulosis   . Hypertension   . Osteoarthritis   . 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 CAD (coronary artery disease)     minimal coronary plaque in the  LAD and right coronary system. 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 7-06. Last nuclear stress 11-17-06 fixed anterior/ inferior defect, no inducible ischemia, EF 81%  . Allergic rhinitis   . Hx of colonoscopy   . RA (rheumatoid arthritis)   . COPD (chronic obstructive pulmonary disease)   . PONV (postoperative nausea and vomiting)   . Dysrhythmia 01-24-12    past hx. A.Fib x1 episode-responded to med.  . Depression   . Anxiety     pt. feels its related to the medicine that he takes   . GERD (gastroesophageal reflux disease)     Past Surgical History  Procedure Laterality Date  . Appendectomy    . Inguinal hernia repair  2008    Right, left remotely  . Total knee arthroplasty      right knee partial 2002  . Tonsillectomy    . Shoulder preplacement  2008    left, partial  . Ptca w/ coated stent lad      Cx secondary to tear  . Foot surgery  11-08    for removal of bone spurs- right foot  . Reconstruction of ankle  6-09    Right- [facility]  . L4-5 diskectomy w/ fusion, cage placement and rods  12-10    [place]  . Partial knee arthroplasty      left  . Total shoulder replacement      right  . Joint replacement    . Cataract extraction, bilateral  01-24-12    Bilateral  . Orif shoulder fracture  02/06/2012    Procedure: OPEN REDUCTION INTERNAL FIXATION (ORIF) SHOULDER FRACTURE;  Surgeon: Nita Sells, MD;  Location: [facility];  Service: Orthopedics;  Laterality: Left;  ORIF of a Left Shoulder Fracture with  Iliac Crest Bone Graft aspiration   . Harvest bone graft  02/06/2012    Procedure: HARVEST ILIAC BONE GRAFT;  Surgeon: Nita Sells, MD;  Location: [facility];  Service: Orthopedics;;  bone marrow aspirqation   . Hardware removal  03/09/2012    Procedure: HARDWARE REMOVAL;  Surgeon: Nita Sells, MD;  Location: [facility];  Service: Orthopedics;  Laterality: Left;   Hardware Removal from  Left Shoulder  . Cardiac catheterization  2006    coronary stents- no problems  . Shoulder arthroscopy  2013  . Carpal tunnel release      R hand  . Ankle surgery Right 2008    at [facility], replacement   . Dg toes*r*      toe surgery- 07/2012  . Posterior lumbar fusion 4 level N/A 03/02/2013    Procedure: Lumbar One to Sacral One Posterior lumbar interbody fusion;  Surgeon: Floyce Stakes, MD;  Location: [facility];  Service: Neurosurgery;  Laterality: N/A;  L1 to S1 Posterior lumbar interbody fusion  . Lumbar wound debridement N/A 03/17/2013    Procedure: Incision and drainage of superficial lumbar wound;  Surgeon: Floyce Stakes, MD;  Location: [facility];  Service: Neurosurgery;  Laterality: N/A;  Incision and drainage of superficial lumbar wound    Terrace Arabia RD, LDN

## 2013-03-22 NOTE — Progress Notes (Signed)
Pt wife very concerned with care and pain management. This nurse, pt. And wife discussed plan of care and medication regime and other alternative to help relieve pain. Pt and wife both stated understanding and content with plan of care we discussed. Will continue to monitor pain and effectiveness. Messer, Elease Etienne RN

## 2013-03-23 ENCOUNTER — Other Ambulatory Visit: Payer: Self-pay | Admitting: Internal Medicine

## 2013-03-23 LAB — BASIC METABOLIC PANEL
BUN: 7 mg/dL (ref 6–23)
CO2: 26 mEq/L (ref 19–32)
Calcium: 7.6 mg/dL — ABNORMAL LOW (ref 8.4–10.5)
Chloride: 95 mEq/L — ABNORMAL LOW (ref 96–112)
Creatinine, Ser: 0.5 mg/dL (ref 0.50–1.35)
GFR calc Af Amer: >90 mL/min (ref >90)
GFR calc non Af Amer: >90 mL/min (ref >90)
Glucose, Bld: 99 mg/dL (ref 70–99)
Potassium: 4 mEq/L (ref 3.5–5.1)
Sodium: 128 mEq/L — ABNORMAL LOW (ref 135–145)

## 2013-03-23 MED ORDER — SODIUM CHLORIDE 0.9 % IV SOLN
INTRAVENOUS | Status: DC
  Administered 2013-03-23 – 2013-04-06 (×9): via INTRAVENOUS

## 2013-03-23 NOTE — Clinical Social Work Psychosocial (Signed)
Clinical Social Work Department BRIEF PSYCHOSOCIAL ASSESSMENT 03/23/2013  Patient:  [name], [name]     Account Number:  [id number]     Admit date:  03/16/2013  Clinical Social Worker:  Donna Christen  Date/Time:  03/23/2013 03:34 PM  Referred by:  Physician  Date Referred:  03/23/2013 Referred for  SNF Placement   Other Referral:   none.   Interview type:  Patient Other interview type:   none.    PSYCHOSOCIAL DATA Living Status:  FAMILY Admitted from facility:   Level of care:   Primary support name:  [name] Primary support relationship to patient:  SPOUSE Degree of support available:   Strong support system, per pt.    CURRENT CONCERNS Current Concerns  Post-Acute Placement   Other Concerns:   none.    SOCIAL WORK ASSESSMENT / PLAN CSW met with pt at bedside. CSW defined CSW role at [facility] to pt. Pt was agreeable to speaking with CSW. Pt stated that he was open to discussing SNF placement with CSW, because he would like either [facility] or [facility]. Pt stated that he was agreeable to [facility] search, but would not like to be placed at [facility] because he had a "bad experience" with them. CSW offered support to pt as he discussed his bad experience at [facility] with CSW. CSW to follow-up with pt regarding his SNF bed options.   Assessment/plan status:  Psychosocial Support/Ongoing Assessment of Needs Other assessment/ plan:   none.   Information/referral to community resources:   [facility] bed offers.    PATIENTS/FAMILYS RESPONSE TO PLAN OF CARE: Pt was understanding and agreeable to CSW plan of care.       [name], [place] Social Worker [phone]

## 2013-03-23 NOTE — Progress Notes (Signed)
Occupational Therapy Treatment Patient Details Name: [name] MRN: [id number] DOB: 1945/10/12 Today's Date: 03/23/2013 Time: 1607-3710 OT Time Calculation (min): 15 min  OT Assessment / Plan / Recommendation  History of present illness Pt is a 67 yo male who has spinal surgery 11 days ago, now presents for symptoms of post operative infection.   OT comments  Pt. Awake in bed and agreeable to oob.  Noted improvement from previous doc. With sequencing and functional ability with amb. To/from b.room and all components of toileting with min guard a   Follow Up Recommendations  Home health OT;Supervision - Intermittent           Equipment Recommendations  3 in 1 bedside comode    Recommendations for Other Services Rehab consult  Frequency Min 2X/week   Progress towards OT Goals Progress towards OT goals: Progressing toward goals  Plan Discharge plan remains appropriate    Precautions / Restrictions Precautions Precautions: Back Precaution Comments: able to don back brace with set up Required Braces or Orthoses: Spinal Brace Spinal Brace: Lumbar corset;Applied in sitting position   Pertinent Vitals/Pain 6/10, repositioned    ADL  Upper Body Dressing: Performed;Set up Lower Body Dressing: Simulated;+1 Total assistance Where Assessed - Lower Body Dressing: Supported sitting Toilet Transfer: Performed;Min guard Armed forces technical officer Method: Sit to Loss adjuster, chartered: Raised toilet seat with arms (or 3-in-1 over toilet);Grab bars Toileting - Clothing Manipulation and Hygiene: Performed;Min guard Where Assessed - Best boy and Hygiene: Standing Transfers/Ambulation Related to ADLs: cues for hand placement when transitioning from sit/stand, stand/sit ADL Comments: unable to don/doff socks without a/e but states he has a/e at home and used prior to admit.  all aspects of toileting with with min guard/s.        OT Goals(current goals can now  be found in the care plan section)    Visit Information  Last OT Received On: 03/23/13 History of Present Illness: Pt is a 67 yo male who has spinal surgery 11 days ago, now presents for symptoms of post operative infection.                 Cognition  Cognition Arousal/Alertness: Awake/alert Behavior During Therapy: WFL for tasks assessed/performed Overall Cognitive Status: Within Functional Limits for tasks assessed    Mobility  Bed Mobility Details for Bed Mobility Assistance: min a to bring b les out of bed, and also guide trunk into upright position as pt. got "stuck" during middle of transition from sidelyinig to sit Transfers Transfers: Sit to Stand;Stand to Sit Sit to Stand: 4: Min guard;With upper extremity assist;From bed Stand to Sit: 4: Min guard;With upper extremity assist;To chair/3-in-1;To toilet               End of Session OT - End of Session Equipment Utilized During Treatment: Back brace;Rolling walker Activity Tolerance: Patient tolerated treatment well Patient left: in chair;with call bell/phone within reach       Janice Coffin, COTA/L 03/23/2013, 3:39 PM

## 2013-03-23 NOTE — Progress Notes (Signed)
Subjective: Still having a fair amount of pain. Has a good appetite and seems to be in reasonable spirits   Objective: Lab:  Recent Labs  03/22/13 0449  HGB 7.3*  HCT 22.3*    Recent Labs  03/21/13 0432 03/22/13 0449  NA 130* 129*  K 4.2 4.3  CL 95* 96  GLUCOSE 102* 109*  BUN 11 11  CREATININE 0.56 0.55  CALCIUM 7.6* 7.4*    Imaging:  Scheduled Meds: . aspirin EC  81 mg Oral Daily  . ceFEPime (MAXIPIME) IV  1 g Intravenous Q12H  . colchicine  0.6 mg Oral BID  . docusate sodium  100 mg Oral BID  . feeding supplement (ENSURE COMPLETE)  237 mL Oral BID BM  . FLUoxetine  40 mg Oral BID  . fluticasone  2 spray Each Nare Daily  . gabapentin  300 mg Oral Custom  . gabapentin  600 mg Oral BID  . heparin subcutaneous  5,000 Units Subcutaneous Q8H  . losartan  50 mg Oral Daily  . magic mouthwash  15 mL Oral TID  . multivitamin with minerals  1 tablet Oral Daily  . ondansetron (ZOFRAN) IV  4 mg Intravenous Q6H  . OxyCODONE  40 mg Oral Q8H  . pantoprazole  40 mg Oral BID  . potassium chloride  20 mEq Oral Daily  . predniSONE  5 mg Oral Q supper  . simvastatin  40 mg Oral q1800  . sodium chloride  10-40 mL Intracatheter Q12H  . tiotropium  18 mcg Inhalation Daily  . traZODone  100 mg Oral QHS   Continuous Infusions:  PRN Meds:.acetaminophen, acetaminophen, diazepam, HYDROmorphone, menthol-cetylpyridinium, methocarbamol, oxyCODONE, phenol, sodium chloride, zolpidem   Physical Exam: Filed Vitals:   03/23/13 0529  BP: 125/77  Pulse: 98  Temp: 98.2 F (36.8 C)  Resp: 18   Gen'l - pleasant man in no acute distress Cor - RRR Pulm - normal respirations, no rales Neuro - A&O      Assessment/Plan: 1. NS  POD#6 - working with PT  2. ID #[id number] cefepime - tolerating well  3. Hyponatremia - on NS. Plan Reduce rate to 50 cc/hr  AM lab  4. Pain mgt - tolerable a lot of the time but still with breakthrough. Plan No change in regimen  5. Anemia - no lab today.  No symptoms - no SOB, c/p Plan AM lab  dispo - SNF   Adella Hare [facility] (o) [phone]; (c) [facility]  Tele: [phone]  03/23/2013, 7:22 AM

## 2013-03-23 NOTE — Clinical Social Work Placement (Signed)
Clinical Social Work Department CLINICAL SOCIAL WORK PLACEMENT NOTE 03/23/2013  Patient:  [name], [name]  Account Number:  [id number] Admit date:  03/16/2013  Clinical Social Worker:  Raquel Sarna [place], LCSWA  Date/time:  03/23/2013 03:37 PM  Clinical Social Work is seeking post-discharge placement for this patient at the following level of care:   [facility]   (*CSW will update this form in Epic as items are completed)   03/23/2013  Patient/family provided with [place] Department of Clinical Social Works list of facilities offering this level of care within the geographic area requested by the patient (or if unable, by the patients family).  03/23/2013  Patient/family informed of their freedom to choose among providers that offer the needed level of care, that participate in Medicare, Medicaid or managed care program needed by the patient, have an available bed and are willing to accept the patient.  03/23/2013  Patient/family informed of [facility] ownership interest in [facility], as well as of the fact that they are under no obligation to receive care at this facility.  PASARR submitted to EDS on  PASARR number received from [place] on   FL2 transmitted to all facilities in geographic area requested by pt/family on  03/23/2013 FL2 transmitted to all facilities within larger geographic area on   Patient informed that his/her managed care company has contracts with or will negotiate with  certain facilities, including the following:     Patient/family informed of bed offers received:   Patient chooses bed at  Physician recommends and patient chooses bed at    Patient to be transferred to  on   Patient to be transferred to facility by   The following physician request were entered in Epic:   Additional Comments: Pt has an existing PASARR.  Pati Gallo, [place] Social Worker [phone]

## 2013-03-23 NOTE — Progress Notes (Signed)
Physical Therapy Treatment Patient Details Name: [name] MRN: [id number] DOB: 07-21-1945 Today's Date: 03/23/2013 Time: 8177-1165 PT Time Calculation (min): 28 min  PT Assessment / Plan / Recommendation  History of Present Illness Pt is a 67 yo male who has spinal surgery 11 days ago, now presents for symptoms of post operative infection.   PT Comments   Patient continues to demonstrate deficits with mobility. Patient required increased cues for safety with bed mobility and transfers in addition to increased time. Given slow progress at this time and patients inability to perform activities safely without assist and guarding, will continue to see with hopes to progress to home if possible and safe but at this time continue to feel patient will benefit from skilled PT upon discharge, recommend ST SNF   Follow Up Recommendations  SNF;Supervision - Intermittent        Barriers to Discharge  Decreased caregiver support (spouse works)      Optician, dispensing  None recommended by PT       Frequency Min 5X/week   Progress towards PT Goals Progress towards PT goals: Progressing toward goals (modestly)  Plan Discharge plan needs to be updated    Precautions / Restrictions Precautions Precautions: Back Precaution Comments: cues for donning back brace Required Braces or Orthoses: Spinal Brace Spinal Brace: Lumbar corset;Applied in standing position Restrictions Weight Bearing Restrictions: No   Pertinent Vitals/Pain 6/10 pain at this time    Mobility  Bed Mobility Bed Mobility: Rolling Right;Right Sidelying to Sit;Sitting - Scoot to [employer] of Bed Rolling Right: 4: Min guard Right Sidelying to Sit: 4: Min guard Sitting - Scoot to [employer] of Bed: 4: Min guard Details for Bed Mobility Assistance: increased time to perform, max cues for positioning and sequencing, teachback with verbal recall used to facilitate proper technique Transfers Transfers: Sit to Stand;Stand to  Sit Sit to Stand: 4: Min assist Stand to Sit: 4: Min guard Details for Transfer Assistance: VCs for hand placement, increased time, assist for stability Ambulation/Gait Ambulation/Gait Assistance: 5: Supervision;4: Min assist Ambulation Distance (Feet): 540 Feet Assistive device: Rolling walker Ambulation/Gait Assistance Details: some isntability noted Gait Pattern: Step-through pattern;Decreased stride length Gait velocity: decreased    Exercises Other Exercises Other Exercises: performed transfer from chair to commode x2 to ensure abilit     PT Goals (current goals can now be found in the care plan section) Acute Rehab PT Goals Patient Stated Goal: to go to rehab PT Goal Formulation: With patient Time For Goal Achievement: 04/12/13 Potential to Achieve Goals: Good  Visit Information  Last PT Received On: 03/23/13 Assistance Needed: +1 History of Present Illness: Pt is a 67 yo male who has spinal surgery 11 days ago, now presents for symptoms of post operative infection.    Subjective Data  Subjective: I had another incident at 4:30 today where I had to be cleaned and couldnt tell I was goin to the bathroom Patient Stated Goal: to go to rehab   Cognition  Cognition Arousal/Alertness: Awake/alert Behavior During Therapy: WFL for tasks assessed/performed Overall Cognitive Status: Within Functional Limits for tasks assessed Memory: Decreased short-term memory       End of Session PT - End of Session Equipment Utilized During Treatment: Gait belt;Back brace Activity Tolerance: Patient limited by pain Patient left: in chair;with call bell/phone within reach Nurse Communication: Mobility status   GP     [name] 03/23/2013, 10:41 AM [name], PT DPT  [phone]

## 2013-03-23 NOTE — Progress Notes (Signed)
Patient ID: [name], male   DOB: Jul 05, 1945, 67 y.o.   MRN: [id number] Temperature to 100.7 this evening. Has had a good day otherwise. Back pain under better control with OxyContin every 8 hours. Motor function appears stable. Dressings still weepy. Every 8 hour changes.

## 2013-03-23 NOTE — Plan of Care (Signed)
Dressing to back removed. Betadine applied to incision. Site is red and yellow drainage noted. Staples intact. Gauze and medapore tape applied. Dated, timed, and initials. Pt tolerated well.

## 2013-03-24 LAB — HEMOGLOBIN AND HEMATOCRIT, BLOOD
HCT: 29.8 % — ABNORMAL LOW (ref 39.0–52.0)
Hemoglobin: 10 g/dL — ABNORMAL LOW (ref 13.0–17.0)

## 2013-03-24 LAB — BASIC METABOLIC PANEL
BUN: 6 mg/dL (ref 6–23)
CO2: 30 mEq/L (ref 19–32)
Calcium: 7.5 mg/dL — ABNORMAL LOW (ref 8.4–10.5)
Chloride: 98 mEq/L (ref 96–112)
Creatinine, Ser: 0.53 mg/dL (ref 0.50–1.35)
GFR calc Af Amer: >90 mL/min (ref >90)
GFR calc non Af Amer: >90 mL/min (ref >90)
Glucose, Bld: 102 mg/dL — ABNORMAL HIGH (ref 70–99)
Potassium: 4.1 mEq/L (ref 3.5–5.1)
Sodium: 133 mEq/L — ABNORMAL LOW (ref 135–145)

## 2013-03-24 LAB — RETICULOCYTES
RBC.: 2.69 MIL/uL — ABNORMAL LOW (ref 4.22–5.81)
Retic Count, Absolute: 51.1 10*3/uL (ref 19.0–186.0)
Retic Ct Pct: 1.9 % (ref 0.4–3.1)

## 2013-03-24 LAB — IRON AND TIBC
Iron: 15 ug/dL — ABNORMAL LOW (ref 42–135)
Saturation Ratios: 13 % — ABNORMAL LOW (ref 20–55)
TIBC: 120 ug/dL — ABNORMAL LOW (ref 215–435)
UIBC: 105 ug/dL — ABNORMAL LOW (ref 125–400)

## 2013-03-24 LAB — FOLATE: Folate: 10.4 ng/mL

## 2013-03-24 LAB — FERRITIN: Ferritin: 127 ng/mL (ref 22–322)

## 2013-03-24 LAB — VITAMIN B12: Vitamin B-12: 1232 pg/mL — ABNORMAL HIGH (ref 211–911)

## 2013-03-24 LAB — PREPARE RBC (CROSSMATCH)

## 2013-03-24 MED ORDER — HYDROMORPHONE HCL PF 1 MG/ML IJ SOLN
1.0000 mg | INTRAMUSCULAR | Status: DC | PRN
  Administered 2013-03-24 – 2013-04-03 (×25): 1 mg via INTRAVENOUS
  Filled 2013-03-24 (×26): qty 1

## 2013-03-24 MED ORDER — FUROSEMIDE 10 MG/ML IJ SOLN: 20.0000 mg | Freq: Once | INTRAMUSCULAR | Status: DC

## 2013-03-24 NOTE — Progress Notes (Signed)
Mentally better, more awake. Low grade fever. Getting packed cells. Minimal wound drainage.dr Linda Hedges helping with his care.ID to see him in 3 weeks

## 2013-03-24 NOTE — Progress Notes (Signed)
Subjective: Easily awakened. Having back pain this AM but reports fairly good control during the day. He denies chest pain or shortness of breath but admits to weakness  Objective: Lab:  Recent Labs  03/22/13 0449 03/24/13 0421  HGB 7.3* 6.8*  HCT 22.3* 20.5*    Recent Labs  03/22/13 0449 03/23/13 0510 03/24/13 0421  NA 129* 128* 133*  K 4.3 4.0 4.1  CL 96 95* 98  GLUCOSE 109* 99 102*  BUN 11 7 6   CREATININE 0.55 0.50 0.53  CALCIUM 7.4* 7.6* 7.5*    Imaging:  Scheduled Meds: . aspirin EC  81 mg Oral Daily  . ceFEPime (MAXIPIME) IV  1 g Intravenous Q12H  . colchicine  0.6 mg Oral BID  . docusate sodium  100 mg Oral BID  . feeding supplement (ENSURE COMPLETE)  237 mL Oral BID BM  . FLUoxetine  40 mg Oral BID  . fluticasone  2 spray Each Nare Daily  . gabapentin  300 mg Oral Custom  . gabapentin  600 mg Oral BID  . heparin subcutaneous  5,000 Units Subcutaneous Q8H  . losartan  50 mg Oral Daily  . magic mouthwash  15 mL Oral TID  . multivitamin with minerals  1 tablet Oral Daily  . ondansetron (ZOFRAN) IV  4 mg Intravenous Q6H  . OxyCODONE  40 mg Oral Q8H  . pantoprazole  40 mg Oral BID  . potassium chloride  20 mEq Oral Daily  . predniSONE  5 mg Oral Q supper  . simvastatin  40 mg Oral q1800  . sodium chloride  10-40 mL Intracatheter Q12H  . tiotropium  18 mcg Inhalation Daily  . traZODone  100 mg Oral QHS   Continuous Infusions: . sodium chloride 50 mL/hr at 03/23/13 2153   PRN Meds:.acetaminophen, acetaminophen, diazepam, HYDROmorphone, menthol-cetylpyridinium, methocarbamol, oxyCODONE, phenol, sodium chloride, zolpidem   Physical Exam: Filed Vitals:   03/24/13 0534  BP: 85/64  Pulse: 96  Temp: 97.9 F (36.6 C)  Resp: 18   Scruffy appearing man in no major distress Cor- quiet precordium, regular tachycardia Pulm - normal respirations Abd - BS+, no guarding or rebound, no tenderness     Assessment/Plan: 1. NS - POD #7 - continued issues  with pain. Also weakness Plan Continue to work with PT  No change in pain medication regimen  2. ID - $5 cefepime. No fever  3. Hyponatremia - sodium up with gentle NS hydration Plan Continue IV NS  4. Pain management - continue scheduled oxycontin and prn dilaudid  5. Anemia - continued drop. Patient tachycardic. Discussed risks and benefits of transfusion and he is willing to proceed. Plan Anemia panel  T&C and transfuse 2 uPRBCs   Adella Hare [facility] (o) [phone]; (c) [facility]  Tele: [phone]  03/24/2013, 6:27 AM

## 2013-03-24 NOTE — Progress Notes (Signed)
Received call from lab with critical results notification at 0550; hemoglobin 6.8. Paged Dr Linda Hedges at [phone] to notify of critical value. At [phone] received call back from physician on call who stated Dr Linda Hedges will be available in 2 hours and will then address the issue.

## 2013-03-24 NOTE — Clinical Social Work Note (Signed)
CSW presented bed offers to pt. Pt was drowsy, so CSW left bed offers at bedside. CSW to continue to follow for discharge planning needs.  Pati Gallo, [place] Social Worker [phone]

## 2013-03-24 NOTE — Progress Notes (Signed)
Physical Therapy Treatment Patient Details Name: [name] MRN: [id number] DOB: December 13, 1945 Today's Date: 03/24/2013 Time: 3005-1102 PT Time Calculation (min): 25 min  PT Assessment / Plan / Recommendation  History of Present Illness Pt is a 67 yo male who has spinal surgery 11 days ago, now presents for symptoms of post operative infection.   PT Comments   Increased pain with ambulation today, patient still easily fatigued.  Patient demonstrates improved cognition but continues to have bouts of incontinence that he is unaware of.  Will continue to see and progress as tolerated. Encouraged patient to begin assisting with self care with hopes to progress quickly.    Follow Up Recommendations  SNF;Supervision - Intermittent     Does the patient have the potential to tolerate intense rehabilitation     Barriers to Discharge        Equipment Recommendations  None recommended by PT    Recommendations for Other Services    Frequency Min 5X/week   Progress towards PT Goals Progress towards PT goals: Progressing toward goals  Plan Discharge plan needs to be updated    Precautions / Restrictions Precautions Precautions: Back Precaution Comments: able to don back brace with set up Required Braces or Orthoses: Spinal Brace Spinal Brace: Lumbar corset;Applied in sitting position   Pertinent Vitals/Pain 7/10    Mobility  Bed Mobility Bed Mobility: Not assessed Transfers Transfers: Sit to Stand;Stand to Sit Sit to Stand: 4: Min guard Stand to Sit: 4: Min guard Details for Transfer Assistance: VCs for hand placement, increased time, assist for stability Ambulation/Gait Ambulation/Gait Assistance: 5: Supervision;4: Min assist Ambulation Distance (Feet): 600 Feet Assistive device: Rolling walker Gait Pattern: Step-through pattern;Decreased stride length Gait velocity: decreased    Exercises Other Exercises Other Exercises: performed transfer from chair to commode x2 to  ensure abilit    PT Goals (current goals can now be found in the care plan section) Acute Rehab PT Goals Patient Stated Goal: to go to rehab PT Goal Formulation: With patient Time For Goal Achievement: 04/12/13 Potential to Achieve Goals: Good  Visit Information  Last PT Received On: 03/24/13 Assistance Needed: +1 History of Present Illness: Pt is a 67 yo male who has spinal surgery 11 days ago, now presents for symptoms of post operative infection.    Subjective Data  Subjective: I had another incident at 4:30 today where I had to be cleaned and couldnt tell I was goin to the bathroom Patient Stated Goal: to go to rehab   Cognition  Cognition Arousal/Alertness: Awake/alert Behavior During Therapy: WFL for tasks assessed/performed Overall Cognitive Status: Within Functional Limits for tasks assessed Memory: Decreased short-term memory       End of Session PT - End of Session Equipment Utilized During Treatment: Gait belt;Back brace Activity Tolerance: Patient limited by pain Patient left: in chair;with call bell/phone within reach Nurse Communication: Mobility status   GP     [name] 03/24/2013, 4:27 PM [name], [place] DPT  [phone]

## 2013-03-24 NOTE — Progress Notes (Signed)
PT Cancellation Note  Patient Details Name: [name] MRN: [id number] DOB: 1945-05-04   Cancelled Treatment:     Per nsg, will hold PT at this time, patient with soft BP and awaiting blood transfusion. HgB low.   [name] 03/24/2013, 9:54 AM

## 2013-03-24 NOTE — Plan of Care (Signed)
Betadine painted to incision. Dressing applied. Pt tolerated well.

## 2013-03-25 ENCOUNTER — Other Ambulatory Visit: Payer: Self-pay | Admitting: Internal Medicine

## 2013-03-25 LAB — TYPE AND SCREEN
ABO/RH(D): A NEG
Antibody Screen: NEGATIVE
Unit division: 0
Unit division: 0

## 2013-03-25 LAB — BASIC METABOLIC PANEL
BUN: 6 mg/dL (ref 6–23)
CO2: 30 mEq/L (ref 19–32)
Calcium: 7.5 mg/dL — ABNORMAL LOW (ref 8.4–10.5)
Chloride: 97 mEq/L (ref 96–112)
Creatinine, Ser: 0.5 mg/dL (ref 0.50–1.35)
GFR calc Af Amer: >90 mL/min (ref >90)
GFR calc non Af Amer: >90 mL/min (ref >90)
Glucose, Bld: 97 mg/dL (ref 70–99)
Potassium: 4.3 mEq/L (ref 3.5–5.1)
Sodium: 131 mEq/L — ABNORMAL LOW (ref 135–145)

## 2013-03-25 LAB — HEMOGLOBIN AND HEMATOCRIT, BLOOD
HCT: 20.5 % — ABNORMAL LOW (ref 39.0–52.0)
HCT: 27.1 % — ABNORMAL LOW (ref 39.0–52.0)
Hemoglobin: 6.8 g/dL — CL (ref 13.0–17.0)
Hemoglobin: 8.9 g/dL — ABNORMAL LOW (ref 13.0–17.0)

## 2013-03-25 MED ORDER — SODIUM CHLORIDE 0.9 % IV SOLN
1000.0000 mg | Freq: Once | INTRAVENOUS | Status: AC
  Administered 2013-03-25: 1000 mg via INTRAVENOUS
  Filled 2013-03-25 (×2): qty 20

## 2013-03-25 MED ORDER — SODIUM CHLORIDE 0.9 % IV SOLN
25.0000 mg | Freq: Once | INTRAVENOUS | Status: AC
  Administered 2013-03-25: 25 mg via INTRAVENOUS
  Filled 2013-03-25: qty 0.5

## 2013-03-25 NOTE — Progress Notes (Signed)
Subjective: [name] is asleep. No need to awaken  Objective: Lab:  Recent Labs  03/24/13 0421 03/24/13 1940 03/25/13 0500  HGB 6.8* 10.0* 8.9*  HCT 20.5* 29.8* 27.1*    Recent Labs  03/23/13 0510 03/24/13 0421  NA 128* 133*  K 4.0 4.1  CL 95* 98  GLUCOSE 99 102*  BUN 7 6  CREATININE 0.50 0.53  CALCIUM 7.6* 7.5*    Imaging:  Scheduled Meds: . aspirin EC  81 mg Oral Daily  . ceFEPime (MAXIPIME) IV  1 g Intravenous Q12H  . colchicine  0.6 mg Oral BID  . docusate sodium  100 mg Oral BID  . feeding supplement (ENSURE COMPLETE)  237 mL Oral BID BM  . FLUoxetine  40 mg Oral BID  . fluticasone  2 spray Each Nare Daily  . furosemide  20 mg Intravenous Once  . gabapentin  300 mg Oral Custom  . gabapentin  600 mg Oral BID  . heparin subcutaneous  5,000 Units Subcutaneous Q8H  . losartan  50 mg Oral Daily  . magic mouthwash  15 mL Oral TID  . multivitamin with minerals  1 tablet Oral Daily  . ondansetron (ZOFRAN) IV  4 mg Intravenous Q6H  . OxyCODONE  40 mg Oral Q8H  . pantoprazole  40 mg Oral BID  . potassium chloride  20 mEq Oral Daily  . predniSONE  5 mg Oral Q supper  . simvastatin  40 mg Oral q1800  . sodium chloride  10-40 mL Intracatheter Q12H  . tiotropium  18 mcg Inhalation Daily  . traZODone  100 mg Oral QHS   Continuous Infusions: . sodium chloride 50 mL/hr at 03/23/13 2153   PRN Meds:.acetaminophen, acetaminophen, diazepam, HYDROmorphone (DILAUDID) injection, HYDROmorphone, menthol-cetylpyridinium, methocarbamol, oxyCODONE, phenol, sodium chloride, zolpidem   Physical Exam: Filed Vitals:   03/25/13 0529  BP: 97/82  Pulse: 92  Temp: 97.1 F (36.2 C)  Resp: 17  no exam      Assessment/Plan: 1. NS - POD #8 - dressing changed yesterday per RN note.  2. ID - Afebrile. #6/42 cefepime  3. Hyponatremia - better @ 133 Plan Continue low flow NS IVF  4. Pain management - no change in regimen  5. Anemia  - retic count normal, B12  1232  Iron low @ 15, % sat  13 low. Hgb up as expected from 2 units blood. Plan IV iron - request to pharmacy.   [name] IM (o) [phone]; (c) [place]  Tele: [phone]  03/25/2013, 6:33 AM

## 2013-03-25 NOTE — Clinical Social Work Note (Signed)
CSW sent clinicals to [facility] for possible SNF authorization.  CSW is awaiting authorization.  Nonnie Done, [place] [phone]  Clinical Social Work

## 2013-03-25 NOTE — Progress Notes (Signed)
MEDICATION RELATED CONSULT NOTE - INITIAL   Pharmacy Consult for IV iron Indication: Iron deficiency anemia  Allergies  Allergen Reactions  . Morphine And Related Shortness Of Breath, Nausea And Vomiting and Swelling  . Penicillins Hives, Shortness Of Breath and Rash    REACTION: hives, breathing problems  . Percocet [Oxycodone-Acetaminophen] Anxiety    Patient Measurements: Height: 5' 6.14" (168 cm) Weight: 158 lb 4.6 oz (71.8 kg) IBW/kg (Calculated) : 64.13   Vital Signs: Temp: 98.7 F (37.1 C) (12/18 0913) Temp src: Oral (12/18 0913) BP: 113/59 mmHg (12/18 0913) Pulse Rate: 96 (12/18 0913) Intake/Output from previous day: 12/17 0701 - 12/18 0700 In: 650 [Blood:650] Out: 1225 [Urine:1225] Intake/Output from this shift: Total I/O In: 120 [P.O.:120] Out: 350 [Urine:350]  Labs:  Recent Labs  03/23/13 0510 03/24/13 0421 03/24/13 1940 03/25/13 0500  HGB  --  6.8* 10.0* 8.9*  HCT  --  20.5* 29.8* 27.1*  CREATININE 0.50 0.53  --  0.50   Estimated Creatinine Clearance: 83.5 ml/min (by C-G formula based on Cr of 0.5).  Medical History: Past Medical History  Diagnosis Date  . Hx of colonic polyps   . Diverticulosis   . Hypertension   . Osteoarthritis   . 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 CAD (coronary artery disease)     minimal coronary plaque in the LAD and right coronary system. 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 7-06. Last nuclear stress 11-17-06 fixed anterior/ inferior defect, no inducible ischemia, EF 81%  . Allergic rhinitis   . Hx of colonoscopy   . RA (rheumatoid arthritis)   . COPD (chronic obstructive pulmonary disease)   . PONV (postoperative nausea and vomiting)   . Dysrhythmia 01-24-12    past hx. A.Fib x1 episode-responded to med.  . Depression   . Anxiety     pt. feels its related to the medicine that he takes   . GERD  (gastroesophageal reflux disease)     Medications:  Prescriptions prior to admission  Medication Sig Dispense Refill  . Aclidinium Bromide (TUDORZA PRESSAIR) 400 MCG/ACT AEPB Inhale 1 puff into the lungs 2 (two) times daily.  1 each  11  . adalimumab (HUMIRA) 40 MG/0.8ML injection Inject 40 mg into the skin every 14 (fourteen) days.      . ALPRAZolam (XANAX) 0.5 MG tablet Take 1 tablet (0.5 mg total) by mouth 3 (three) times daily as needed for anxiety or sleep.  90 tablet  5  . aspirin (ASPIRIN EC) 81 MG EC tablet Take 81 mg by mouth every morning.       . celecoxib (CELEBREX) 200 MG capsule Take 200 mg by mouth 2 (two) times daily.      . cholecalciferol (VITAMIN D) 1000 UNITS tablet Take 1 tablet (1,000 Units total) by mouth daily.  100 tablet  3  . COLCRYS 0.6 MG tablet TAKE 1 TABLET (0.6 MG TOTAL) BY MOUTH 2 (TWO) TIMES DAILY.  60 tablet  5  . FLUoxetine (PROZAC) 40 MG capsule Take 40 mg by mouth 2 (two) times daily.       . fluticasone (FLONASE) 50 MCG/ACT nasal spray Place 2 sprays into both nostrils daily.      . furosemide (LASIX) 20 MG tablet Take 20 mg by mouth daily.      [name] gabapentin (NEURONTIN) 300 MG capsule Take 300-600 mg by  mouth 4 (four) times daily. 600 mg every morning, 300 mg at noon, 600 mg at bedtime, and 300 mg at 1 AM      . hydrochlorothiazide (HYDRODIURIL) 25 MG tablet Take 25 mg by mouth daily before breakfast.       . HYDROmorphone (DILAUDID) 2 MG tablet Take one tablet by mouth every four hours as needed  180 tablet  0  . losartan (COZAAR) 50 MG tablet TAKE 1 TABLET BY MOUTH ONCE A DAY  90 tablet  3  . Melatonin ([employer] MELATONIN) 5 MG TABS Take 5 mg by mouth at bedtime.       . OxyCODONE (OXYCONTIN) 20 mg T12A 12 hr tablet Take 1 tablet (20 mg total) by mouth every 12 (twelve) hours.  60 tablet  0  . pantoprazole (PROTONIX) 40 MG tablet Take 40 mg by mouth 2 (two) times daily.       Vladimir Faster Glycol-Propyl Glycol (SYSTANE ULTRA) 0.4-0.3 % SOLN Apply 2 drops to  eye 2 (two) times daily as needed (dry eyes).       . potassium chloride SA (K-DUR,KLOR-CON) 20 MEQ tablet Take 20 mEq by mouth daily.      . predniSONE (DELTASONE) 5 MG tablet Take 5 mg by mouth every evening.       [name] PRESCRIPTION MEDICATION Inject as directed once.      . simvastatin (ZOCOR) 40 MG tablet Take 40 mg by mouth daily. At lunch      . traZODone (DESYREL) 100 MG tablet Take 100 mg by mouth at bedtime. Take just before bedtime      . Elastic Bandages & Supports (ABDOMINAL BINDER/ELASTIC MED) MISC 1 Device by Does not apply route as needed.  1 each  0    Assessment: 67 y.o. male s/p revision of lumbar wound 12/11. Pt with iron deficiency anemia. Hgb up to 8.9 (s/p PRBC x 2 12/17).  Iron 15, TIBC 13.   Goal of Therapy:  Hgb 12, Iron 42-135,  Sat ratios 20-55%  Plan:  1) Iron dextran 12m IV test dose x 1 2) If pt tolerates test dose, iron dextran 10034mIV x 1 today 3) Pharmacy will sign off - please reconsult if needed.  CaSherlon HandingPharmD, BCPS Clinical pharmacist, pager [phone]/18/2014,11:30 AM

## 2013-03-25 NOTE — Clinical Social Work Note (Signed)
CSW met with pt and pt's wife at bedside. CSW and pt's wife went over pt's bed offers. Pt's wife went to [facility] on 03/25/2013. Pt's wife to inform CSW of her and pt's decision for SNF placement. Pt's wife instructed to call [employer] in order to help expedite insurance approval. CSW to continue to follow and assist with discharge needs once pt is stable for discharge.  Pati Gallo, [place] Social Worker [phone]

## 2013-03-25 NOTE — Progress Notes (Signed)
Physical Therapy Treatment Patient Details Name: BRYLEE MCGREAL MRN: [id number] DOB: 1945-08-26 Today's Date: 03/25/2013 Time: 4327-6147 PT Time Calculation (min): 19 min  PT Assessment / Plan / Recommendation  History of Present Illness Pt is a 67 yo male who has spinal surgery 11 days ago, now presents for symptoms of post operative infection.   PT Comments   Patient able to perform bed mobility, ambulation, and transfers with less cues today. Progressing with activity tolerance despite increased pain. Will continue to see and progress as tolerated.   Follow Up Recommendations  SNF;Supervision - Intermittent           Equipment Recommendations  None recommended by PT       Frequency Min 5X/week   Progress towards PT Goals Progress towards PT goals: Progressing toward goals  Plan Discharge plan needs to be updated    Precautions / Restrictions Precautions Precautions: Back Precaution Comments: able to don back brace with set up Required Braces or Orthoses: Spinal Brace Spinal Brace: Lumbar corset;Applied in sitting position Restrictions Weight Bearing Restrictions: No   Pertinent Vitals/Pain 8/10    Mobility  Bed Mobility Bed Mobility: Rolling Right;Right Sidelying to Sit;Sitting - Scoot to [employer] of Bed Rolling Right: 4: Min guard Right Sidelying to Sit: 4: Min guard Sitting - Scoot to [employer] of Bed: 4: Min guard Details for Bed Mobility Assistance: increased time to perform, max cues for positioning and sequencing, teachback with verbal recall used to facilitate proper technique Transfers Transfers: Sit to Stand;Stand to Sit Sit to Stand: 4: Min guard Stand to Sit: 4: Min guard Details for Transfer Assistance: VCs for hand placement, increased time, assist for stability Ambulation/Gait Ambulation/Gait Assistance: 5: Supervision;4: Min assist Ambulation Distance (Feet): 600 Feet Assistive device: Rolling walker Gait Pattern: Step-through pattern;Decreased  stride length Gait velocity: decreased      PT Goals (current goals can now be found in the care plan section) Acute Rehab PT Goals Patient Stated Goal: to go to rehab PT Goal Formulation: With patient Time For Goal Achievement: 04/12/13 Potential to Achieve Goals: Good  Visit Information  Last PT Received On: 03/25/13 Assistance Needed: +1 History of Present Illness: Pt is a 67 yo male who has spinal surgery 11 days ago, now presents for symptoms of post operative infection.    Subjective Data  Subjective: I had another incident at 4:30 today where I had to be cleaned and couldnt tell I was goin to the bathroom Patient Stated Goal: to go to rehab   Cognition  Cognition Arousal/Alertness: Awake/alert Behavior During Therapy: WFL for tasks assessed/performed Overall Cognitive Status: Within Functional Limits for tasks assessed Memory: Decreased short-term memory    Balance   improving  End of Session PT - End of Session Equipment Utilized During Treatment: Gait belt;Back brace Activity Tolerance: Patient limited by pain Patient left: in chair;with call bell/phone within reach Nurse Communication: Mobility status   GP     [name] 03/25/2013, 11:41 AM [name], PT DPT  [phone]

## 2013-03-25 NOTE — Progress Notes (Signed)
Betadine paintd on incision. New dressing applied. Skin around incision is red. Rashes on bottom. Pt tolerated well.

## 2013-03-25 NOTE — Progress Notes (Signed)
Patient ID: [name], male   DOB: [birth year], 67 y.o.   MRN: [id number] Better, less pain. More alert. Looking for a SNF

## 2013-03-26 LAB — OCCULT BLOOD X 1 CARD TO LAB, STOOL: Fecal Occult Bld: NEGATIVE

## 2013-03-26 MED ORDER — HYDROMORPHONE HCL 2 MG PO TABS: ORAL_TABLET | ORAL | Status: DC

## 2013-03-26 MED ORDER — DIAZEPAM 5 MG PO TABS: 5.0000 mg | ORAL_TABLET | Freq: Four times a day (QID) | ORAL | Status: DC | PRN

## 2013-03-26 MED ORDER — ENSURE COMPLETE PO LIQD: 237.0000 mL | Freq: Two times a day (BID) | ORAL | Status: DC

## 2013-03-26 MED ORDER — DSS 100 MG PO CAPS: 100.0000 mg | ORAL_CAPSULE | Freq: Two times a day (BID) | ORAL | Status: DC

## 2013-03-26 MED ORDER — OXYCODONE HCL ER 40 MG PO T12A: 40.0000 mg | EXTENDED_RELEASE_TABLET | Freq: Three times a day (TID) | ORAL | Status: DC

## 2013-03-26 MED ORDER — ZOLPIDEM TARTRATE 5 MG PO TABS: 5.0000 mg | ORAL_TABLET | Freq: Every evening | ORAL | Status: DC | PRN

## 2013-03-26 NOTE — Progress Notes (Signed)
Subjective: Stable. He is concerned about his wound - RN report indicates the wound may need attention. His pain control is moderate by his report.   Objective: Lab:  Recent Labs  03/24/13 0421 03/24/13 1940 03/25/13 0500  HGB 6.8* 10.0* 8.9*  HCT 20.5* 29.8* 27.1*    Recent Labs  03/24/13 0421 03/25/13 0500  NA 133* 131*  K 4.1 4.3  CL 98 97  GLUCOSE 102* 97  BUN 6 6  CREATININE 0.53 0.50  CALCIUM 7.5* 7.5*    Imaging:  Scheduled Meds: . aspirin EC  81 mg Oral Daily  . ceFEPime (MAXIPIME) IV  1 g Intravenous Q12H  . colchicine  0.6 mg Oral BID  . docusate sodium  100 mg Oral BID  . feeding supplement (ENSURE COMPLETE)  237 mL Oral BID BM  . FLUoxetine  40 mg Oral BID  . fluticasone  2 spray Each Nare Daily  . furosemide  20 mg Intravenous Once  . gabapentin  300 mg Oral Custom  . gabapentin  600 mg Oral BID  . heparin subcutaneous  5,000 Units Subcutaneous Q8H  . losartan  50 mg Oral Daily  . magic mouthwash  15 mL Oral TID  . multivitamin with minerals  1 tablet Oral Daily  . ondansetron (ZOFRAN) IV  4 mg Intravenous Q6H  . OxyCODONE  40 mg Oral Q8H  . pantoprazole  40 mg Oral BID  . potassium chloride  20 mEq Oral Daily  . predniSONE  5 mg Oral Q supper  . simvastatin  40 mg Oral q1800  . sodium chloride  10-40 mL Intracatheter Q12H  . tiotropium  18 mcg Inhalation Daily  . traZODone  100 mg Oral QHS   Continuous Infusions: . sodium chloride 50 mL/hr at 03/23/13 2153   PRN Meds:.acetaminophen, acetaminophen, diazepam, HYDROmorphone (DILAUDID) injection, HYDROmorphone, menthol-cetylpyridinium, methocarbamol, oxyCODONE, phenol, sodium chloride, zolpidem   Physical Exam: Filed Vitals:   03/26/13 0500  BP: 115/74  Pulse: 99  Temp: 98.5 F (36.9 C)  Resp: 18   Cor - BP stable Pulm - normal respirations. Neuro - Awake and alert.     Assessment/Plan: 1. NS POD #9 - wound care per Neurosurgery  2. ID. - afebritle #7/42 cefepime  3.  Hyponatremia - stalbe  4. Pain management - at discharge will continue present regimen. Rx's written  5. Anemia - No repeat Hgb. Recommend Hgb at SNF 5 days.    Adella Hare [place] IM (o) [phone]; (c) [place]  Tele: [phone]  03/26/2013, 8:30 AM

## 2013-03-26 NOTE — Progress Notes (Signed)
Patient ID: [name], male   DOB: [birth year], 67 y.o.   MRN: [id number] No fever. C/o incisional pain. Wound with some dehiscence and some drainage. Changes in the skin secondary to tapr. Plan to leave wound open to air

## 2013-03-26 NOTE — Progress Notes (Signed)
Physical Therapy Treatment Patient Details Name: [name] MRN: [id number] DOB: February 25, 1946 Today's Date: 03/26/2013 Time: 5093-2671 PT Time Calculation (min): 26 min  PT Assessment / Plan / Recommendation  History of Present Illness Pt is a 67 yo male who has spinal surgery 11 days ago, now presents for symptoms of post operative infection.   PT Comments   Patient feelings better today, still not recognizing when saturated in urine. Received in saturated linens, gown, and bed. Assisted patient with hygiene and educated patient on the importance of calling for assist should he realize he was saturated so as to protect the wound and not allow urine to enter. Patient did ambulate well, still requiring some cues for back precautions during mobility. Overall progressing modestly. Will continue to see as indicated.   Follow Up Recommendations  SNF;Supervision - Intermittent     Does the patient have the potential to tolerate intense rehabilitation     Barriers to Discharge        Equipment Recommendations  None recommended by PT    Recommendations for Other Services    Frequency Min 5X/week   Progress towards PT Goals Progress towards PT goals: Progressing toward goals  Plan Discharge plan needs to be updated    Precautions / Restrictions Precautions Precautions: Back Precaution Comments: able to don back brace with set up Required Braces or Orthoses: Spinal Brace Spinal Brace: Lumbar corset;Applied in sitting position   Pertinent Vitals/Pain 5/10    Mobility  Bed Mobility Bed Mobility: Rolling Right;Right Sidelying to Sit;Sitting - Scoot to [employer] of Bed Rolling Right: 4: Min guard Right Sidelying to Sit: 4: Min guard Sitting - Scoot to [employer] of Bed: 4: Min guard Details for Bed Mobility Assistance: increased time to perform, max cues for positioning and sequencing, teachback with verbal recall used to facilitate proper technique Transfers Transfers: Sit to Stand;Stand  to Sit Sit to Stand: 4: Min guard Stand to Sit: 4: Min guard Details for Transfer Assistance: VCs for hand placement, increased time, assist for stability Ambulation/Gait Ambulation/Gait Assistance: 5: Supervision;4: Min assist Ambulation Distance (Feet): 620 Feet Assistive device: Rolling walker Gait Pattern: Step-through pattern;Decreased stride length Gait velocity: decreased     PT Goals (current goals can now be found in the care plan section) Acute Rehab PT Goals Patient Stated Goal: to go to rehab PT Goal Formulation: With patient Time For Goal Achievement: 04/12/13 Potential to Achieve Goals: Good  Visit Information  Last PT Received On: 03/26/13 Assistance Needed: +1 History of Present Illness: Pt is a 67 yo male who has spinal surgery 11 days ago, now presents for symptoms of post operative infection.    Subjective Data  Subjective: I feel much better today Patient Stated Goal: to go to rehab   Cognition  Cognition Arousal/Alertness: Awake/alert Behavior During Therapy: WFL for tasks assessed/performed Overall Cognitive Status: Within Functional Limits for tasks assessed Memory: Decreased short-term memory    Balance  Dynamic Standing Balance Dynamic Standing - Balance Support: Bilateral upper extremity supported Dynamic Standing - Level of Assistance: 5: Stand by assistance Dynamic Standing - Comments: assisted with hygiene activities   End of Session PT - End of Session Equipment Utilized During Treatment: Gait belt;Back brace Activity Tolerance: Patient limited by pain Patient left: in chair;with call bell/phone within reach Nurse Communication: Mobility status   GP     [name] 03/26/2013, 3:06 PM [name], [place] DPT  [phone]

## 2013-03-26 NOTE — Progress Notes (Signed)
Patient ID: [name], male   DOB: 11-07-1945, 67 y.o.   MRN: [id number] HALF OF STAPLES WERE REMOVED. WOUND CLEANED WITH betadine

## 2013-03-26 NOTE — Progress Notes (Signed)
Painted incision with Betadine. Incision doesn't look healthy. Patient tolerated well.

## 2013-03-27 NOTE — Progress Notes (Signed)
Patient ID: [name], male   DOB: [birth year], 67 y.o.   MRN: [id number] Afeb. vss Wound has an opening down to the fascia in middle third of the wound There is no obvious infection. Will start saline wet to dry dressing changes and then probably get a wound vac on Monday.

## 2013-03-28 NOTE — Progress Notes (Signed)
Filed Vitals:   03/27/13 1813 03/27/13 2133 03/28/13 0228 03/28/13 0910  BP: 119/68 117/69 111/77 110/70  Pulse: 93 95 88 99  Temp: 98.2 F (36.8 C) 97.8 F (36.6 C) 97.8 F (36.6 C) 97.8 F (36.6 C)  TempSrc: Oral Oral Oral Oral  Resp: 18 14 16 20   Height:      Weight:      SpO2: 93% 94% 97% 95%    Patient 11 day status post a wound revision. Small area of wound dehiscence begun on wet-to-dry normal saline dressings yesterday. Patient reports that he is up and ambulating in the halls.  Plan: Continue wet-to-dry dressings, may be a candidate for a wound VAC, will be reassessed tomorrow by Dr. Joya Salm. Encouraged to ambulate in halls.  Hosie Spangle, MD 03/28/2013, 9:33 AM

## 2013-03-28 NOTE — Progress Notes (Signed)
Pt ambulated in the hall with RN, back brace and walker to assist.  Tolerated well, back to bed without incidence.  Pt encouraged to increase frequency of ambulation, will pass along to pm RN.

## 2013-03-29 ENCOUNTER — Ambulatory Visit: Payer: Medicare Other | Admitting: Neurology

## 2013-03-29 NOTE — Progress Notes (Signed)
Occupational Therapy Treatment Patient Details Name: [name] MRN: [id number] DOB: 1946/03/23 Today's Date: 03/29/2013 Time: 1007-1030 OT Time Calculation (min): 23 min  OT Assessment / Plan / Recommendation  History of present illness Pt is a 67 yo male who has spinal surgery 11 days ago, now presents for symptoms of post operative infection.   OT comments  Pt currently incontinent with decr awareness. Pt requires condom foley to prevent soiling back bandage in supine. Pt currently with wife out of town for the holiday. Pt CAN NOT d/c home alone. Pt will require SNF placement. Pt with uncontrolled pain at this time. Pt reports extreme lateral side pains and Lt shoulder pain. Pt with IV pain medication provided prior to session.   Follow Up Recommendations  Home health OT;SNF    Barriers to Discharge       Equipment Recommendations  3 in 1 bedside comode    Recommendations for Other Services    Frequency Min 2X/week   Progress towards OT Goals Progress towards OT goals: Progressing toward goals  Plan Discharge plan remains appropriate    Precautions / Restrictions Precautions Precautions: Back Precaution Comments: able to don back brace with set up Required Braces or Orthoses: Spinal Brace Spinal Brace: Lumbar corset;Applied in sitting position   Pertinent Vitals/Pain 10 out 10 pain    ADL  Eating/Feeding: Set up Where Assessed - Eating/Feeding: Bed level Toilet Transfer: Min Psychiatric nurse Method: Sit to Loss adjuster, chartered: Raised toilet seat with arms (or 3-in-1 over toilet) Equipment Used: Back brace;Gait belt;Rolling walker Transfers/Ambulation Related to ADLs: Pt completing transfer from bed to chair ADL Comments: Pt currently with incontinence and condom cath in place. Pt with leaking from foley and unaware. Pt required gown doff and new gown don. Pt doff back brace MOD I. Pt return to supine due to 10 out 10 pain. Rn Meredeth Ide provided IV  medication prior to OT arrival. pt doff shoes by pushing shoes off with opposite foot. Pt required (A) with BIL LE    OT Diagnosis:    OT Problem List:   OT Treatment Interventions:     OT Goals(current goals can now be found in the care plan section) Acute Rehab OT Goals Patient Stated Goal: to get a little stronger to go home OT Goal Formulation: With patient Time For Goal Achievement: 04/07/13 Potential to Achieve Goals: Good ADL Goals Pt Will Perform Grooming: with supervision;standing Pt Will Perform Lower Body Bathing: with supervision;sit to/from stand Pt Will Perform Lower Body Dressing: with supervision;sit to/from stand Pt Will Transfer to Toilet: with supervision;ambulating Pt Will Perform Toileting - Clothing Manipulation and hygiene: with supervision;sit to/from stand Pt Will Perform Tub/Shower Transfer: with supervision;ambulating;3 in 1  Visit Information  Last OT Received On: 03/29/13 Assistance Needed: +1 History of Present Illness: Pt is a 67 yo male who has spinal surgery 11 days ago, now presents for symptoms of post operative infection.    Subjective Data      Prior Functioning       Cognition  Cognition Arousal/Alertness: Awake/alert Behavior During Therapy: WFL for tasks assessed/performed Overall Cognitive Status: Within Functional Limits for tasks assessed    Mobility  Bed Mobility Bed Mobility: Sit to Supine Rolling Right: 5: Supervision Right Sidelying to Sit: 5: Supervision Sitting - Scoot to Edge of Bed: 5: Supervision Sit to Supine: 3: Mod assist;HOB flat Details for Bed Mobility Assistance: (A) for bil LE to lift into bed Transfers Transfers: Sit to Stand;Stand to  Sit Sit to Stand: 4: Min guard;With upper extremity assist;From chair/3-in-1 Stand to Sit: 4: Min guard;With upper extremity assist;To bed Details for Transfer Assistance: good sequence and hand placement    Exercises      Balance     End of Session    GO     Peri Maris 03/29/2013, 10:35 AM Pager: [phone]

## 2013-03-29 NOTE — Progress Notes (Signed)
Physical Therapy Treatment Patient Details Name: [name] MRN: [id number] DOB: 11-23-1945 Today's Date: 03/29/2013 Time: 6644-0347 PT Time Calculation (min): 23 min  PT Assessment / Plan / Recommendation  History of Present Illness Pt is a 67 yo male who has spinal surgery 11 days ago, now presents for symptoms of post operative infection.   PT Comments   Patient with less pain today and improved mobility. Spoke with patient at length regarding discharge recommendations. No incontinence issues currently as patient has had a foley placed. Based on mobility level, feel patient is progressing well and may d/c home with supervision/assist and HHPT. Patient states that he does not have the ability to have supervision at this time as wife is out of the state. If supervision can not be arranged, may need to continued consideration of ST SNF upon discharge. Will continue to see as indicated and progress activity as tolerated.   Follow Up Recommendations  Home health PT;Supervision - Intermittent (if unable to provide supervision may continued to need SNF)           Equipment Recommendations  None recommended by PT       Frequency Min 5X/week   Progress towards PT Goals Progress towards PT goals: Progressing toward goals  Plan Discharge plan needs to be updated    Precautions / Restrictions Precautions Precautions: Back Precaution Comments: able to don back brace with set up Required Braces or Orthoses: Spinal Brace Spinal Brace: Lumbar corset;Applied in sitting position   Pertinent Vitals/Pain 6/10    Mobility  Bed Mobility Bed Mobility: Rolling Right;Right Sidelying to Sit;Sitting - Scoot to [employer] of Bed Rolling Right: 5: Supervision Right Sidelying to Sit: 5: Supervision Sitting - Scoot to Edge of Bed: 5: Supervision Details for Bed Mobility Assistance: increased time to perform, VCs for maintaining full sidelying before coming to seated position Transfers Transfers:  Sit to Stand;Stand to Sit Sit to Stand: 5: Supervision Stand to Sit: 5: Supervision Details for Transfer Assistance: VCs for upright posture Ambulation/Gait Ambulation/Gait Assistance: 5: Supervision;4: Min assist Ambulation Distance (Feet): 620 Feet Assistive device: Rolling walker Ambulation/Gait Assistance Details: occassional VCs for upright posture Gait Pattern: Step-through pattern;Decreased stride length Gait velocity: improved     PT Goals (current goals can now be found in the care plan section) Acute Rehab PT Goals Patient Stated Goal: to get a little stronger to go home PT Goal Formulation: With patient Time For Goal Achievement: 04/12/13 Potential to Achieve Goals: Good  Visit Information  Last PT Received On: 03/29/13 Assistance Needed: +1 History of Present Illness: Pt is a 67 yo male who has spinal surgery 11 days ago, now presents for symptoms of post operative infection.    Subjective Data  Subjective: Ive been able to stay a lot dryer with the foley in Patient Stated Goal: to get a little stronger to go home   Cognition  Cognition Arousal/Alertness: Awake/alert Behavior During Therapy: WFL for tasks assessed/performed Overall Cognitive Status: Within Functional Limits for tasks assessed    Balance   Improved stability  End of Session PT - End of Session Equipment Utilized During Treatment: Gait belt;Back brace Activity Tolerance: Patient limited by pain Patient left: in chair;with call bell/phone within reach Nurse Communication: Mobility status   GP     [name] 03/29/2013, 9:10 AM [name], [place] DPT  [phone]

## 2013-03-29 NOTE — Progress Notes (Signed)
Stable  Poor physical activity. Some draiaege in wound . To get rn wound consultant

## 2013-03-29 NOTE — Progress Notes (Signed)
NUTRITION FOLLOW UP  Intervention:    Continue Ensure Complete po BID, each supplement provides 350 kcal and 13 grams of protein RD to follow for nutrition care plan  Nutrition Dx:   Inadequate oral intake related to pain as evidenced by PO intake 50%, improved  Goal:   Pt to meet >/= 90% of their estimated nutrition needs, progressing  Monitor:   PO & supplemental intake, weight, labs, I/O's  Assessment:   Patient with PMH of CAD, COPD, RA and HTN admitted for fever, poor appetite with vomiting and wound infection; s/p lumbar fusion 11 days ago; Hospitalists consulted for hyponatremia.   Patient s/p procedure 12/12: REVISION OF LUMBAR WOUND  Patient reports his appetite is still decreased.  Today's lunch tray untouched on tray table.  PO intake 50-75% per flowsheet records.  Pain & mobility improving.  Noted may be candidate for wound VAC.  RD brought patient chocolate Ensure Complete supplement.  Height: Ht Readings from Last 1 Encounters:  03/16/13 5' 6.14" (1.68 m)    Weight Status:   Wt Readings from Last 1 Encounters:  03/16/13 158 lb 4.6 oz (71.8 kg)    Re-estimated needs:  Kcal: 1800-2000 Protein: 90-100 gm Fluid: 1.8-2.0 L  Skin: lumbar wound  Diet Order: Cardiac   Intake/Output Summary (Last 24 hours) at 03/29/13 1403 Last data filed at 03/29/13 0900  Gross per 24 hour  Intake    130 ml  Output   4825 ml  Net  -4695 ml    Labs:   Recent Labs Lab 03/23/13 0510 03/24/13 0421 03/25/13 0500  NA 128* 133* 131*  K 4.0 4.1 4.3  CL 95* 98 97  CO2 26 30 30   BUN 7 6 6   CREATININE 0.50 0.53 0.50  CALCIUM 7.6* 7.5* 7.5*  GLUCOSE 99 102* 97    Scheduled Meds: . aspirin EC  81 mg Oral Daily  . ceFEPime (MAXIPIME) IV  1 g Intravenous Q12H  . colchicine  0.6 mg Oral BID  . docusate sodium  100 mg Oral BID  . feeding supplement (ENSURE COMPLETE)  237 mL Oral BID BM  . FLUoxetine  40 mg Oral BID  . fluticasone  2 spray Each Nare Daily  .  furosemide  20 mg Intravenous Once  . gabapentin  300 mg Oral Custom  . gabapentin  600 mg Oral BID  . heparin subcutaneous  5,000 Units Subcutaneous Q8H  . losartan  50 mg Oral Daily  . magic mouthwash  15 mL Oral TID  . multivitamin with minerals  1 tablet Oral Daily  . ondansetron (ZOFRAN) IV  4 mg Intravenous Q6H  . OxyCODONE  40 mg Oral Q8H  . pantoprazole  40 mg Oral BID  . potassium chloride  20 mEq Oral Daily  . predniSONE  5 mg Oral Q supper  . simvastatin  40 mg Oral q1800  . sodium chloride  10-40 mL Intracatheter Q12H  . tiotropium  18 mcg Inhalation Daily  . traZODone  100 mg Oral QHS    Continuous Infusions: . sodium chloride 50 mL/hr at 03/29/13 0515    Arthur Holms, RD, LDN Pager #: [phone] After-Hours Pager #: [phone]

## 2013-03-30 MED ORDER — FLUCONAZOLE 100 MG PO TABS
100.0000 mg | ORAL_TABLET | Freq: Every day | ORAL | Status: DC
  Administered 2013-03-30 – 2013-04-06 (×8): 100 mg via ORAL
  Filled 2013-03-30 (×8): qty 1

## 2013-03-30 NOTE — Progress Notes (Signed)
Subjective: Pretty good spirits. He says pain is pretty well moderated. He has been up. Spoke with Melody, wound care team. She reports that there appears to be candida at the back and wound.  Objective: Lab: No results found for this basename: WBC, NEUTROABS, HGB, HCT, MCV, PLT,  in the last 72 hours No results found for this basename: NA, K, CL, CO3, GLUCOSE, BUN, CREATININE, CALCIUM, MG, PHOS,  in the last 72 hours  Imaging:  Scheduled Meds: . aspirin EC  81 mg Oral Daily  . ceFEPime (MAXIPIME) IV  1 g Intravenous Q12H  . colchicine  0.6 mg Oral BID  . docusate sodium  100 mg Oral BID  . feeding supplement (ENSURE COMPLETE)  237 mL Oral BID BM  . FLUoxetine  40 mg Oral BID  . fluticasone  2 spray Each Nare Daily  . furosemide  20 mg Intravenous Once  . gabapentin  300 mg Oral Custom  . gabapentin  600 mg Oral BID  . heparin subcutaneous  5,000 Units Subcutaneous Q8H  . losartan  50 mg Oral Daily  . magic mouthwash  15 mL Oral TID  . multivitamin with minerals  1 tablet Oral Daily  . ondansetron (ZOFRAN) IV  4 mg Intravenous Q6H  . OxyCODONE  40 mg Oral Q8H  . pantoprazole  40 mg Oral BID  . potassium chloride  20 mEq Oral Daily  . predniSONE  5 mg Oral Q supper  . simvastatin  40 mg Oral q1800  . sodium chloride  10-40 mL Intracatheter Q12H  . tiotropium  18 mcg Inhalation Daily  . traZODone  100 mg Oral QHS   Continuous Infusions: . sodium chloride 50 mL/hr at 03/29/13 2129   PRN Meds:.acetaminophen, acetaminophen, diazepam, HYDROmorphone (DILAUDID) injection, HYDROmorphone, menthol-cetylpyridinium, methocarbamol, oxyCODONE, phenol, sodium chloride, zolpidem   Physical Exam: Filed Vitals:   03/30/13 0624  BP: 127/75  Pulse: 73  Temp: 98.4 F (36.9 C)  Resp: 18   Bright and alert. No respiratory distress      Assessment/Plan: 1. NS POD #13 - moderate wound dehiscence - wound care team involved. Plan Will add diflucan 100 mg daily for yeast infection back  and wound  2. ID - afebril #11/42 cefepime  4. Pain management - stable on current regimen  5. Anemia - will check H/H   [facility] (o) [phone]; (c) [facility]  Tele: [phone]  03/30/2013, 7:49 AM

## 2013-03-30 NOTE — Consult Note (Addendum)
WOC wound consult note Reason for Consult: evaluation of lumbar wound. Pt with lumbar wound s/p lumbar surgery 03/03/13 ,03/09/13 hardware removal, lumber surgical revision 03/18/13,  Wound type: surgical wound Measurement: 7cm x 2cm x 1.5cm  Wound bed:90% pink, clean, moist, 10% yellow slough.  One visible suture in the distal portion of the wound Drainage (amount, consistency, odor) heavy serous drainage over the entire length of the wound Periwound: significant erythema and satellite lesions over a large portion of the back, this extends from the surgical incision and wound 4-6 cm around the entire incision and wound. Pt reports increasing burning and pruritis.  Discussed this with Dr. Linda Hedges this morning.  He will add PO Diflucan to the medication regimen.  The incision has intact staples both distally and proximal to the open area the incision is macerated and I am concerned the area is too wet from the drainage being held in the topper dressing.   Due to this fact and the location of the wound I would not recommend a NPWT VAC at this time.   Dressing procedure/placement/frequency: Add silver hydrofiber to fill wound bed, apply silver transfer dressing over the remainder of the incision both will provide antimicrobial affects and absorb excess drainage. Protect periwound skin with skin barrier wipes.  Limit use of moisture retentive dressings and tape if possible.    Supplies ordered and to the bedside for nursing to change dressing every other day.  Discussed with patient and bedside nurse.  [facility] team will follow along with you.  [place], [place]

## 2013-03-30 NOTE — Progress Notes (Signed)
Physical Therapy Treatment Patient Details Name: [name] MRN: [id number] DOB: 1946-01-09 Today's Date: 03/30/2013 Time: 1020-1100 PT Time Calculation (min): 40 min  PT Assessment / Plan / Recommendation  History of Present Illness Pt is a 67 yo male who has spinal surgery 11 days ago, now presents for symptoms of post operative infection.   PT Comments   Patient continues to make steady progress towards PT goals. Ambulating better today, and tolerated extensive time in standing position for various activities. Will continue to see as progress as tolerated. Rec HHPT.  Follow Up Recommendations  Home health PT;Supervision - Intermittent (if unable to provide supervision may continued to need SNF)     Does the patient have the potential to tolerate intense rehabilitation     Barriers to Discharge        Equipment Recommendations  None recommended by PT    Recommendations for Other Services    Frequency Min 5X/week   Progress towards PT Goals Progress towards PT goals: Progressing toward goals  Plan Discharge plan needs to be updated    Precautions / Restrictions Precautions Precautions: Back Precaution Comments: able to don back brace with set up Required Braces or Orthoses: Spinal Brace Spinal Brace: Lumbar corset;Applied in sitting position   Pertinent Vitals/Pain 5/10    Mobility  Bed Mobility Bed Mobility: Rolling Right;Right Sidelying to Sit;Sitting - Scoot to Edge of Bed Rolling Right: 5: Supervision Right Sidelying to Sit: 5: Supervision Sitting - Scoot to Edge of Bed: 5: Supervision Details for Bed Mobility Assistance: improved sequencing, but increased time to perform Transfers Transfers: Sit to Stand;Stand to Sit Sit to Stand: 5: Supervision Stand to Sit: 5: Supervision Details for Transfer Assistance: continues to require cues for upr upright posture Ambulation/Gait Ambulation/Gait Assistance: 5: Supervision;4: Min assist Ambulation Distance  (Feet): 620 Feet Assistive device: Rolling walker Ambulation/Gait Assistance Details: improved stability with one standing rest break during ambulaton Gait Pattern: Step-through pattern;Decreased stride length Gait velocity: improved    Exercises Other Exercises Other Exercises: assisted patient with self bathing at sink, multiple sit<>stands, lateral lean/weight shifting with minor LOB noted x2.      PT Goals (current goals can now be found in the care plan section) Acute Rehab PT Goals Patient Stated Goal: to get a little stronger to go home PT Goal Formulation: With patient Time For Goal Achievement: 04/12/13 Potential to Achieve Goals: Good  Visit Information  Last PT Received On: 03/30/13 Assistance Needed: +1 History of Present Illness: Pt is a 67 yo male who has spinal surgery 11 days ago, now presents for symptoms of post operative infection.    Subjective Data  Subjective: Ive been able to stay a lot dryer with the foley in Patient Stated Goal: to get a little stronger to go home   Cognition  Cognition Arousal/Alertness: Awake/alert Behavior During Therapy: WFL for tasks assessed/performed Overall Cognitive Status: Within Functional Limits for tasks assessed    Balance  Dynamic Standing Balance Dynamic Standing - Balance Support: No upper extremity supported;During functional activity Dynamic Standing - Level of Assistance: 5: Stand by assistance Dynamic Standing - Balance Activities: Lateral lean/weight shifting;Forward lean/weight shifting;Reaching for objects;Reaching across midline Dynamic Standing - Comments: assisted with hygiene activities   End of Session PT - End of Session Equipment Utilized During Treatment: Gait belt;Back brace Activity Tolerance: Patient limited by pain Patient left: in chair;with call bell/phone within reach Nurse Communication: Mobility status   GP     [name] 03/30/2013, 11:05  AM [name], PT DPT  [phone]

## 2013-03-31 LAB — HEMOGLOBIN AND HEMATOCRIT, BLOOD
HCT: 29.5 % — ABNORMAL LOW (ref 39.0–52.0)
Hemoglobin: 9.6 g/dL — ABNORMAL LOW (ref 13.0–17.0)

## 2013-03-31 NOTE — Clinical Social Work Note (Signed)
CSW signing off. Pt to be discharged home once medically stable for discharge. Please re consult if discharge disposition changes. Thank you.  Pati Gallo, [place] Social Worker [phone]

## 2013-03-31 NOTE — Progress Notes (Signed)
Patient ID: [name], male   DOB: Oct 18, 1945, 67 y.o.   MRN: [id number] No fever. Rest of staples removed. Wound healing by [facility] with good granulation tissue. If he goes home Friday we need a RN to help with the wound care. i spoke with his wife yesterday

## 2013-03-31 NOTE — Progress Notes (Signed)
Occupational Therapy Treatment Patient Details Name: [name] MRN: [id number] DOB: [birth year] Today's Date: 03/31/2013 Time: 1751-0258 OT Time Calculation (min): 42 min  OT Assessment / Plan / Recommendation  History of present illness Pt is a 67 yo male who has spinal surgery 11 days ago, now presents for symptoms of post operative infection.   OT comments  Pt tolerated static standing > 20 minutes at sink level. Pt progressing well. Pt pending d/c Friday or Saturday with wife return from [place], [place]. Pt will need wound care education prior to d/c home.    Follow Up Recommendations  Home health OT;SNF;Other (comment) (WOUND CARE rn FOR BACK)    Barriers to Discharge       Equipment Recommendations  3 in 1 bedside comode    Recommendations for Other Services    Frequency Min 2X/week   Progress towards OT Goals Progress towards OT goals: Progressing toward goals  Plan Discharge plan remains appropriate    Precautions / Restrictions Precautions Precautions: Back Required Braces or Orthoses: Spinal Brace Spinal Brace: Lumbar corset;Applied in sitting position   Pertinent Vitals/Pain Tolerated well    ADL  Grooming: Wash/dry hands;Wash/dry face;Teeth care;Shaving;Min guard Where Assessed - Grooming: Unsupported standing Toilet Transfer: Min Psychiatric nurse Method: Sit to Loss adjuster, chartered: Raised toilet seat with arms (or 3-in-1 over toilet) Equipment Used: Back brace;Rolling walker Transfers/Ambulation Related to ADLs: pt ambulating with RW Min guard (A) ADL Comments:  pt completed sit<.stand from chair and ambulation to sink level. Pt shaving face and brushing teeth. pt with x1 month beard and requried incr time to shave. pt static standing for > 20 minutes. Pt progressing well this session and pain well tolerated.     OT Diagnosis:    OT Problem List:   OT Treatment Interventions:     OT Goals(current goals can now be found in the care  plan section) Acute Rehab OT Goals Patient Stated Goal: to get a little stronger to go home OT Goal Formulation: With patient Time For Goal Achievement: 04/07/13 Potential to Achieve Goals: Good ADL Goals Pt Will Perform Grooming: with supervision;standing Pt Will Perform Lower Body Bathing: with supervision;sit to/from stand Pt Will Perform Lower Body Dressing: with supervision;sit to/from stand Pt Will Transfer to Toilet: with supervision;ambulating Pt Will Perform Toileting - Clothing Manipulation and hygiene: with supervision;sit to/from stand Pt Will Perform Tub/Shower Transfer: with supervision;ambulating;3 in 1 Additional ADL Goal #1: Pt will generalize back precautions independently.  Visit Information  Last OT Received On: 03/31/13 Assistance Needed: +1 History of Present Illness: Pt is a 67 yo male who has spinal surgery 11 days ago, now presents for symptoms of post operative infection.    Subjective Data      Prior Functioning       Cognition  Cognition Arousal/Alertness: Awake/alert Behavior During Therapy: WFL for tasks assessed/performed Overall Cognitive Status: Within Functional Limits for tasks assessed    Mobility  Bed Mobility Bed Mobility: Not assessed Transfers Transfers: Sit to Stand;Stand to Sit Sit to Stand: 4: Min guard;With upper extremity assist;From chair/3-in-1 Stand to Sit: 4: Min guard;With upper extremity assist;To chair/3-in-1 Details for Transfer Assistance: good hand placement    Exercises      Balance     End of Session OT - End of Session Activity Tolerance: Patient tolerated treatment well Patient left: in chair;with call bell/phone within reach Nurse Communication: Mobility status;Precautions  GO     Peri Maris 03/31/2013, 10:32 AM Pager: [phone]

## 2013-03-31 NOTE — Progress Notes (Signed)
Physical Therapy Treatment Patient Details Name: [name] MRN: [id number] DOB: [birth year] Today's Date: 03/31/2013 Time: 8003-4917 PT Time Calculation (min): 30 min  PT Assessment / Plan / Recommendation  History of Present Illness Pt is a 67 yo male who has spinal surgery 11 days ago, now presents for symptoms of post operative infection.   PT Comments   Patient continues to demonstrate steady progress with mobility. Ambulating well, able to perform some self care but did still require assist for peri care today. Spoke with wife at length regarding mobility and supervision needs upon discharge.    Follow Up Recommendations  Home health PT;Supervision - Intermittent (if unable to provide supervision may continued to need SNF)           Equipment Recommendations  None recommended by PT       Frequency Min 5X/week   Progress towards PT Goals Progress towards PT goals: Progressing toward goals  Plan Discharge plan needs to be updated    Precautions / Restrictions Precautions Precautions: Back Precaution Comments: able to don back brace with set up Required Braces or Orthoses: Spinal Brace Spinal Brace: Lumbar corset;Applied in sitting position   Pertinent Vitals/Pain 4/10    Mobility  Bed Mobility Bed Mobility: Rolling Right;Right Sidelying to Sit;Sitting - Scoot to Edge of Bed Rolling Right: 5: Supervision Right Sidelying to Sit: 5: Supervision Sitting - Scoot to Edge of Bed: 5: Supervision Details for Bed Mobility Assistance: increased time to perform Transfers Transfers: Sit to Stand;Stand to Sit Sit to Stand: 5: Supervision Stand to Sit: 5: Supervision Details for Transfer Assistance: VCs for upright posture Ambulation/Gait Ambulation/Gait Assistance: 5: Supervision;4: Min assist Ambulation Distance (Feet): 800 Feet Assistive device: Rolling walker Ambulation/Gait Assistance Details: good stability with ambulation Gait Pattern: Step-through  pattern;Decreased stride length Gait velocity: improved    Exercises     PT Diagnosis:    PT Problem List:   PT Treatment Interventions:     PT Goals (current goals can now be found in the care plan section) Acute Rehab PT Goals Patient Stated Goal: to get a little stronger to go home PT Goal Formulation: With patient Time For Goal Achievement: 04/12/13 Potential to Achieve Goals: Good  Visit Information  Last PT Received On: 03/31/13 Assistance Needed: +1 History of Present Illness: Pt is a 67 yo male who has spinal surgery 11 days ago, now presents for symptoms of post operative infection.    Subjective Data  Subjective: I feel so much better today Patient Stated Goal: to get a little stronger to go home   Cognition  Cognition Arousal/Alertness: Awake/alert Behavior During Therapy: WFL for tasks assessed/performed Overall Cognitive Status: Within Functional Limits for tasks assessed    Balance  Dynamic Standing Balance Dynamic Standing - Balance Support: During functional activity Dynamic Standing - Level of Assistance: 7: Independent  End of Session PT - End of Session Equipment Utilized During Treatment: Gait belt;Back brace Activity Tolerance: Patient limited by pain Patient left: in chair;with call bell/phone within reach Nurse Communication: Mobility status   GP     [name] 03/31/2013, 1:59 PM [name], [place] DPT  [phone]

## 2013-04-01 MED ORDER — PROCHLORPERAZINE 25 MG RE SUPP
25.0000 mg | Freq: Four times a day (QID) | RECTAL | Status: DC | PRN
  Administered 2013-04-01 – 2013-04-02 (×2): 25 mg via RECTAL
  Filled 2013-04-01 (×2): qty 1

## 2013-04-01 NOTE — Progress Notes (Signed)
Patient ID: [name], male   DOB: [birth year], 67 y.o.   MRN: [id number] Doing well. No fever. Wound being cleaned by nurses. No c/o

## 2013-04-02 ENCOUNTER — Other Ambulatory Visit: Payer: Self-pay | Admitting: Internal Medicine

## 2013-04-02 MED ORDER — ONDANSETRON HCL 4 MG/2ML IJ SOLN
4.0000 mg | Freq: Four times a day (QID) | INTRAMUSCULAR | Status: DC | PRN
  Administered 2013-04-02 – 2013-04-04 (×4): 4 mg via INTRAVENOUS
  Filled 2013-04-02 (×4): qty 2

## 2013-04-02 NOTE — Progress Notes (Signed)
Talked to patient about discharge planning, [place] choices; patient chose [place]; [facility] with [facility] called for arrangements; Attending MD please enter orders for HHRN/ PT and if the patient is going home on IV antibiotics, please order antibiotic, dosage and duration for the [facility] in epic under home health care orders; B Tamera Punt RN,BSN,MHA

## 2013-04-02 NOTE — Progress Notes (Signed)
Patient ID: [name], male   DOB: [birth year], 67 y.o.   MRN: [id number] Stable. No more nausea. Wound healing. Wife coming today from [place]. Dc tonight?

## 2013-04-02 NOTE — Progress Notes (Signed)
Physical Therapy Treatment Patient Details Name: [name] MRN: [id number] DOB: [birth year] Today's Date: 04/02/2013 Time: 7482-7078 PT Time Calculation (min): 25 min  PT Assessment / Plan / Recommendation  History of Present Illness Pt is a 67 yo male who has spinal surgery 11 days ago, now presents for symptoms of post operative infection.   PT Comments   Patient has made steady progress towards PT goals, performing activities independently. Ambulating well.  Only concern is for hygiene and pericare. Spoke to patient at length regarding importance of hygiene related to infection prevention.  Will decrease frequency of PT services, encouraged continued ambulation with nsg.   Follow Up Recommendations  Home health PT;Supervision - Intermittent           Equipment Recommendations  None recommended by PT       Frequency Min 3X/week   Progress towards PT Goals Progress towards PT goals  Plan Current plan remains appropriate;Frequency needs to be updated    Precautions / Restrictions Precautions Precautions: Back Precaution Comments: able to don back brace with set up Required Braces or Orthoses: Spinal Brace Spinal Brace: Lumbar corset;Applied in sitting position   Pertinent Vitals/Pain 3/10    Mobility  Bed Mobility Bed Mobility: Rolling Right;Right Sidelying to Sit;Sitting - Scoot to [employer] of Bed Rolling Right: 6: Modified independent (Device/Increase time) Right Sidelying to Sit: 6: Modified independent (Device/Increase time) Sitting - Scoot to Edge of Bed: 6: Modified independent (Device/Increase time) Details for Bed Mobility Assistance: increased time to perform Transfers Transfers: Sit to Stand;Stand to Sit Sit to Stand: 6: Modified independent (Device/Increase time) Stand to Sit: 6: Modified independent (Device/Increase time) Details for Transfer Assistance: increased time to perform, no physical assist required Ambulation/Gait Ambulation/Gait Assistance:  5: Supervision;4: Min assist Ambulation Distance (Feet): 800 Feet Assistive device: Rolling walker Gait Pattern: Step-through pattern;Decreased stride length Gait velocity: improved      PT Goals (current goals can now be found in the care plan section) Acute Rehab PT Goals Patient Stated Goal: to get a little stronger to go home PT Goal Formulation: With patient Time For Goal Achievement: 04/12/13 Potential to Achieve Goals: Good  Visit Information  Last PT Received On: 04/02/13 Assistance Needed: +1 History of Present Illness: Pt is a 67 yo male who has spinal surgery 11 days ago, now presents for symptoms of post operative infection.    Subjective Data  Subjective: I am wet Patient Stated Goal: to get a little stronger to go home   Cognition  Cognition Arousal/Alertness: Awake/alert Behavior During Therapy: WFL for tasks assessed/performed Overall Cognitive Status: Within Functional Limits for tasks assessed    Balance  Dynamic Standing Balance Dynamic Standing - Balance Support: During functional activity Dynamic Standing - Level of Assistance: 7: Independent  End of Session PT - End of Session Equipment Utilized During Treatment: Gait belt;Back brace Activity Tolerance: Patient limited by pain Patient left: in chair;with call bell/phone within reach Nurse Communication: Mobility status   GP     [name] 04/02/2013, 9:54 AM [name], PT DPT  [phone]

## 2013-04-03 NOTE — Progress Notes (Signed)
Patient ID: [name], male   DOB: 1945/12/10, 67 y.o.   MRN: [id number] Normal temperature. Wound healing. i was planning to send him home today with antibiotics but now he is complaining of recurrent left shoulder pain and wants to see dr Tamera Punt before he goes home to be sure his problem will not require surgery as before. My office will get hold of dr Tamera Punt on monday

## 2013-04-03 NOTE — Progress Notes (Signed)
Occupational Therapy Treatment Patient Details Name: [name] MRN: [id number] DOB: May 31, 1945 Today's Date: 04/03/2013 Time: 9381-0175 OT Time Calculation (min): 39 min  OT Assessment / Plan / Recommendation  History of present illness Pt underwent L1-S1 decompression and fusion.  On 03/16/13 he had increased temp and was admitted    OT comments  Pt is making excellent progress with BADLs. Currently, he requires supervision.  He demonstrates good safety awareness and understanding of back precautions   Follow Up Recommendations  Supervision/Assistance - 24 hour;No OT follow up    Barriers to Discharge       Equipment Recommendations  3 in 1 bedside comode    Recommendations for Other Services    Frequency Min 2X/week   Progress towards OT Goals Progress towards OT goals: Progressing toward goals  Plan Discharge plan needs to be updated    Precautions / Restrictions Precautions Precautions: Back Type of Shoulder Precautions: Pt able to state 3/3 precautions. Precaution Comments: able to don back brace with set up Required Braces or Orthoses: Spinal Brace Spinal Brace: Lumbar corset;Applied in sitting position Restrictions Weight Bearing Restrictions: No   Pertinent Vitals/Pain     ADL  Grooming: Wash/dry hands;Wash/dry face;Shaving;Teeth care;Brushing hair;Supervision/safety Where Assessed - Grooming: Unsupported standing Lower Body Dressing: Supervision/safety Where Assessed - Lower Body Dressing: Supported sit to Lobbyist: Supervision/safety Armed forces technical officer Method: Sit to stand;Stand pivot Science writer: Comfort height toilet Toileting - Water quality scientist and Hygiene: Supervision/safety Where Assessed - Best boy and Hygiene: Standing Equipment Used: Back brace;Rolling walker Transfers/Ambulation Related to ADLs: supervision ADL Comments: Pt demonstrates excellent awareness of back precautions.  Pt stood  at sink to complete grooming, then ambulated on unit    OT Diagnosis:    OT Problem List:   OT Treatment Interventions:     OT Goals(current goals can now be found in the care plan section) ADL Goals Pt Will Perform Grooming: with supervision;standing Pt Will Perform Lower Body Bathing: with supervision;sit to/from stand Pt Will Perform Lower Body Dressing: with supervision;sit to/from stand Pt Will Transfer to Toilet: with supervision;ambulating Pt Will Perform Toileting - Clothing Manipulation and hygiene: with supervision;sit to/from stand Pt Will Perform Tub/Shower Transfer: with supervision;ambulating;3 in 1 Additional ADL Goal #1: Pt will generalize back precautions independently.  Visit Information  Last OT Received On: 04/03/13 Assistance Needed: +1 History of Present Illness: Pt underwent L1-S1 decompression and fusion.  On 03/16/13 he had increased temp and was admitted     Subjective Data      Prior Functioning       Cognition  Cognition Arousal/Alertness: Awake/alert Behavior During Therapy: WFL for tasks assessed/performed Overall Cognitive Status: Within Functional Limits for tasks assessed    Mobility  Bed Mobility Bed Mobility: Rolling Left;Right Sidelying to Sit;Sitting - Scoot to Edge of Bed Rolling Right: 6: Modified independent (Device/Increase time);With rail Sitting - Scoot to Edge of Bed: 6: Modified independent (Device/Increase time) Details for Bed Mobility Assistance: increased time and dependent on rail Transfers Transfers: Sit to Stand;Stand to Sit Sit to Stand: With upper extremity assist;From bed;6: Modified independent (Device/Increase time) Stand to Sit: With upper extremity assist;To chair/3-in-1;6: Modified independent (Device/Increase time)    Exercises      Balance     End of Session OT - End of Session Equipment Utilized During Treatment: Back brace;Rolling walker Activity Tolerance: Patient tolerated treatment well Patient  left: in chair;with call bell/phone within reach Nurse Communication: Mobility status  GO  Lucille Passy M 04/03/2013, 3:25 PM

## 2013-04-04 NOTE — Progress Notes (Signed)
C/o hoarse voice x 5 days  - c/o left shoulder pain  Temp:  [97.7 F (36.5 C)-98.1 F (36.7 C)] 97.8 F (36.6 C) (12/28 9093) Pulse Rate:  [88-110] 90 (12/28 0613) Resp:  [18-20] 18 (12/28 1121) BP: (111-139)/(67-89) 139/87 mmHg (12/28 0613) SpO2:  [94 %-95 %] 95 % (12/28 6244)  woyund healing Plan: was planning to send him home  with antibiotics but now he is complaining of recurrent left shoulder pain and wants to see dr Tamera Punt before he goes home to be sure his problem will not require surgery as before.  dr. Harley Hallmark office will get hold of dr Tamera Punt on monday

## 2013-04-05 MED ORDER — DEXTROSE 5 % IV SOLN
1.0000 g | Freq: Two times a day (BID) | INTRAVENOUS | Status: DC
  Administered 2013-04-05 – 2013-04-06 (×2): 1 g via INTRAVENOUS
  Filled 2013-04-05 (×3): qty 1

## 2013-04-05 NOTE — Progress Notes (Signed)
NUTRITION FOLLOW UP  Intervention:   Continue Ensure Complete po BID, each supplement provides 350 kcal and 13 grams of protein. Encouraged high kcal/protein intake. RD to follow for nutrition care plan.  Nutrition Dx:   Inadequate oral intake related to pain as evidenced by PO intake 50%, resolved.  Goal:   Pt to meet >/= 90% of their estimated nutrition needs, progressing  Monitor:   PO & supplemental intake, weight, labs, I/O's  Assessment:   Patient with PMH of CAD, COPD, RA and HTN admitted for fever, poor appetite with vomiting and wound infection; s/p lumbar fusion 11 days ago; Hospitalists consulted for hyponatremia.   Patient s/p procedure 12/12: REVISION OF LUMBAR WOUND  Pt reports that his appetite is much improved. Drinking Ensure Complete as scheduled. Discussed healthy eating and patient reports that he usually eats well at home. States that he and his wife eat healthy.  Height: Ht Readings from Last 1 Encounters:  03/16/13 5' 6.14" (1.68 m)    Weight Status:   Wt Readings from Last 1 Encounters:  03/16/13 158 lb 4.6 oz (71.8 kg)    Re-estimated needs:  Kcal: 1800-2000 Protein: 90-100 gm Fluid: 1.8-2.0 L  Skin: lumbar wound  Diet Order: Cardiac   Intake/Output Summary (Last 24 hours) at 04/05/13 1541 Last data filed at 04/05/13 1300  Gross per 24 hour  Intake    540 ml  Output   3300 ml  Net  -2760 ml    Last BM: 12/28  Labs:  No results found for this basename: NA, K, CL, CO2, BUN, CREATININE, CALCIUM, MG, PHOS, GLUCOSE,  in the last 168 hours  Scheduled Meds: . aspirin EC  81 mg Oral Daily  . ceFEPime (MAXIPIME) IV  1 g Intravenous Q12H  . colchicine  0.6 mg Oral BID  . docusate sodium  100 mg Oral BID  . feeding supplement (ENSURE COMPLETE)  237 mL Oral BID BM  . fluconazole  100 mg Oral Daily  . FLUoxetine  40 mg Oral BID  . fluticasone  2 spray Each Nare Daily  . furosemide  20 mg Intravenous Once  . gabapentin  300 mg Oral Custom   . gabapentin  600 mg Oral BID  . heparin subcutaneous  5,000 Units Subcutaneous Q8H  . losartan  50 mg Oral Daily  . magic mouthwash  15 mL Oral TID  . multivitamin with minerals  1 tablet Oral Daily  . OxyCODONE  40 mg Oral Q8H  . pantoprazole  40 mg Oral BID  . potassium chloride  20 mEq Oral Daily  . predniSONE  5 mg Oral Q supper  . simvastatin  40 mg Oral q1800  . sodium chloride  10-40 mL Intracatheter Q12H  . tiotropium  18 mcg Inhalation Daily  . traZODone  100 mg Oral QHS    Continuous Infusions: . sodium chloride 50 mL/hr at 04/02/13 2311    [facility] MS, RD, LDN Pager: [phone] After-hours pager: [phone]

## 2013-04-05 NOTE — Progress Notes (Signed)
Physical Therapy Discharge Patient Details Name: [name] MRN: [id number] DOB: Dec 29, 1945 Today's Date: 04/05/2013 Time: 9242-6834 PT Time Calculation (min): 18 min  Patient discharged from PT services secondary to goals met and no further PT needs identified.  Please see latest therapy progress note for current level of functioning and progress toward goals.    Progress and discharge plan discussed with patient and/or caregiver: Patient/Caregiver agrees with plan  GP     [name] 04/05/2013, 9:38 AM

## 2013-04-05 NOTE — Progress Notes (Signed)
Physical Therapy Treatment Patient Details Name: [name] MRN: [id number] DOB: 02-11-1946 Today's Date: 04/05/2013 Time: 2800-3491 PT Time Calculation (min): 18 min  PT Assessment / Plan / Recommendation  History of Present Illness Pt is a 67 yo male who has spinal surgery 11 days ago, now presents for symptoms of post operative infection.   PT Comments   All acute PT goals met, no further needs, will d/c acute PT at this time. Patient in agreement.   Follow Up Recommendations  Home health PT;Supervision - Intermittent     Does the patient have the potential to tolerate intense rehabilitation     Barriers to Discharge        Equipment Recommendations  None recommended by PT    Recommendations for Other Services    Frequency Min 3X/week   Progress towards PT Goals Progress towards PT goals: Goals met/education completed, patient discharged from PT  Plan Current plan remains appropriate;Frequency needs to be updated    Precautions / Restrictions Precautions Precautions: Back Precaution Comments: able to don back brace with set up Required Braces or Orthoses: Spinal Brace Spinal Brace: Lumbar corset;Applied in sitting position   Pertinent Vitals/Pain 3/10    Mobility  Bed Mobility Bed Mobility: Rolling Right;Right Sidelying to Sit;Sitting - Scoot to [employer] of Bed Rolling Right: 6: Modified independent (Device/Increase time) Right Sidelying to Sit: 6: Modified independent (Device/Increase time) Sitting - Scoot to Edge of Bed: 6: Modified independent (Device/Increase time) Details for Bed Mobility Assistance: increased time to perform Transfers Transfers: Sit to Stand;Stand to Sit Sit to Stand: 6: Modified independent (Device/Increase time) Stand to Sit: 6: Modified independent (Device/Increase time) Details for Transfer Assistance: increased time to perform, no physical assist required Ambulation/Gait Ambulation/Gait Assistance: 6: Modified independent  (Device/Increase time) Ambulation Distance (Feet): 500 Feet Assistive device: Rolling walker Gait Pattern: Step-through pattern;Decreased stride length Stairs: Yes Stairs Assistance: 5: Supervision Stairs Assistance Details (indicate cue type and reason): VCs for side ways technique Stair Management Technique: One rail Right;Step to pattern;Sideways Number of Stairs: 5      PT Goals (current goals can now be found in the care plan section) Acute Rehab PT Goals Patient Stated Goal: to get a little stronger to go home PT Goal Formulation: With patient Time For Goal Achievement: 04/12/13 Potential to Achieve Goals: Good  Visit Information  Last PT Received On: 04/05/13 Assistance Needed: +1 History of Present Illness: Pt is a 67 yo male who has spinal surgery 11 days ago, now presents for symptoms of post operative infection.    Subjective Data  Subjective: I am wet Patient Stated Goal: to get a little stronger to go home   Cognition  Cognition Arousal/Alertness: Awake/alert Behavior During Therapy: WFL for tasks assessed/performed Overall Cognitive Status: Within Functional Limits for tasks assessed    Balance  Dynamic Standing Balance Dynamic Standing - Balance Support: During functional activity Dynamic Standing - Level of Assistance: 7: Independent  End of Session PT - End of Session Equipment Utilized During Treatment: Gait belt;Back brace Activity Tolerance: Patient limited by pain Patient left: in chair;with call bell/phone within reach Nurse Communication: Mobility status   GP     [name] 04/05/2013, 9:38 AM [name], PT DPT  [phone]

## 2013-04-05 NOTE — Progress Notes (Signed)
No issues overnight. Pt cont to c/o hoarseness of voice over the past 1 week, without difficulty swallowing or sore throat. Also cont to c/o left shoulder pain.   EXAM:  BP 114/74  Pulse 92  Temp(Src) 98.4 F (36.9 C) (Oral)  Resp 18  Ht 5' 6.14" (1.68 m)  Wt 71.8 kg (158 lb 4.6 oz)  BMI 25.44 kg/m2  SpO2 92%  Awake, alert, oriented  Speech fluent, appropriate  CN grossly intact  5/5 BUE/BLE  Wound granulating well, with fibrinous exudate. No purulent drainage  IMPRESSION:  67 y.o. male s/p wound debridement for superficial infection, on IV abx  PLAN: - Wound care consult prior to d/c - Spoke with Dr. Tamera Punt RE: left shoulder who will see the patient tomorrow if still inpatient, otherwise can f/u in the clinic.

## 2013-04-06 MED ORDER — HEPARIN SOD (PORK) LOCK FLUSH 100 UNIT/ML IV SOLN
250.0000 [IU] | INTRAVENOUS | Status: AC | PRN
  Administered 2013-04-06: 250 [IU]

## 2013-04-06 MED ORDER — HEPARIN SOD (PORK) LOCK FLUSH 100 UNIT/ML IV SOLN: 250.0000 [IU] | Freq: Two times a day (BID) | INTRAVENOUS | Status: DC

## 2013-04-06 MED ORDER — DEXTROSE 5 % IV SOLN: 1.0000 g | Freq: Two times a day (BID) | INTRAVENOUS | Status: DC

## 2013-04-06 NOTE — Discharge Summary (Signed)
Physician Discharge Summary  Patient ID: [name] MRN: [id number] DOB/AGE: 1945/12/17 67 y.o.  Admit date: 03/16/2013 Discharge date: 04/06/2013  Admission Diagnoses: Lumbar wound infection  Discharge Diagnoses: Same Active Problems:   DYSLIPIDEMIA   PSEUDOGOUT   HYPERTENSION   CAD   VENOUS INSUFFICIENCY, LEGS   Rheumatoid arthritis(714.0)   HEMOCHROMATOSIS, HX OF   Infection, undetermined etiology   Dehydration   Hyponatremia   GERD (gastroesophageal reflux disease)   Hypokalemia   Discharged Condition: Stable  Hospital Course:  [name] is a 67 y.o. male Initially admitted with possible wound infection after undergoing multilevel lumbar fusion surgery several weeks prior.[name]  He was taken to the operating room on 03/19/13 for wound exploration and debridement.  Wound cultures were also taken at the time, which demonstrated Pseudomonas.  This was consistent with a urinary tract infection which also demonstrated Pseudomonas.  The patient was placed on intravenous cefepime, with a treatment course to last for approximately 40 days.  The patient was monitored for several days on the general neurosciences floor.  He was seen by physical and occupational therapy, as well as the wound care nurse.  The patient also developed some hoarseness of voice which was not associated with difficulty swallowing or sore throat.  He also complained of some left shoulder pain similar to that he had previously requiring surgery by Dr. Tamera Punt.  Preparations were made for home administration of his antibiotics - Cefepime 1g Q12 hrs to complete a course of 40 days ending on January 23, as well as evaluation of his wound at home.  When this was completed, the patient was deemed stable for discharge.  Treatments: Surgery - Wound exploration and debridement  Discharge Exam: Blood pressure 127/77, pulse 93, temperature 98.2 F (36.8 C), temperature source Oral, resp. rate 18, height  5' 6.14" (1.68 m), weight 71.8 kg (158 lb 4.6 oz), SpO2 97.00%. Awake, alert, oriented Speech fluent, appropriate CN grossly intact 5/5 BUE/BLE Wound granulating well with fibrinous exudate, no signs of active infection.  Follow-up: Follow-up in Dr. Harley Hallmark office [facility] [phone]) in 2-3 weeks  Disposition: Home   Future Appointments Provider Department Dept Phone   04/12/2013 2:00 PM Truman Hayward, MD [facility] [phone]   07/16/2013 12:00 PM Star Age, MD [facility] [phone]       Medication List         Abdominal Binder/Elastic Med Misc  1 Device by Does not apply route as needed.     Aclidinium Bromide 400 MCG/ACT Aepb  Commonly known as:  TUDORZA PRESSAIR  Inhale 1 puff into the lungs 2 (two) times daily.     ALPRAZolam 0.5 MG tablet  Commonly known as:  XANAX  Take 1 tablet (0.5 mg total) by mouth 3 (three) times daily as needed for anxiety or sleep.     aspirin EC 81 MG EC tablet  Generic drug:  aspirin  Take 81 mg by mouth every morning.     CELEBREX 200 MG capsule  Generic drug:  celecoxib  TAKE 1 CAPSULE TWICE A DAY     cholecalciferol 1000 UNITS tablet  Commonly known as:  VITAMIN D  Take 1 tablet (1,000 Units total) by mouth daily.     COLCRYS 0.6 MG tablet  Generic drug:  colchicine  TAKE 1 TABLET (0.6 MG TOTAL) BY MOUTH 2 (TWO) TIMES DAILY.     CVS MELATONIN 5 MG Tabs  Generic drug:  Melatonin  Take 5 mg by mouth at bedtime.     dextrose 5 % SOLN 50 mL with ceFEPIme 1 G SOLR 1 g  Inject 1 g into the vein every 12 (twelve) hours.     diazepam 5 MG tablet  Commonly known as:  VALIUM  Take 1 tablet (5 mg total) by mouth every 6 (six) hours as needed for muscle spasms.     DSS 100 MG Caps  Take 100 mg by mouth 2 (two) times daily.     feeding supplement (ENSURE COMPLETE) Liqd  Take 237 mLs by mouth 2 (two) times daily between meals.      FLUoxetine 40 MG capsule  Commonly known as:  PROZAC  TAKE 2 CAPSULES (80 MG TOTAL) BY MOUTH DAILY.     FLUoxetine 40 MG capsule  Commonly known as:  PROZAC  Take 40 mg by mouth 2 (two) times daily.     fluticasone 50 MCG/ACT nasal spray  Commonly known as:  FLONASE  PLACE 2 SPRAYS INTO THE NOSE DAILY.     fluticasone 50 MCG/ACT nasal spray  Commonly known as:  FLONASE  Place 2 sprays into both nostrils daily.     furosemide 20 MG tablet  Commonly known as:  LASIX  TAKE 1 TABLET EVERY DAY     gabapentin 300 MG capsule  Commonly known as:  NEURONTIN  Take 300-600 mg by mouth 4 (four) times daily. 600 mg every morning, 300 mg at noon, 600 mg at bedtime, and 300 mg at 1 AM     heparin lock flush 100 UNIT/ML Soln injection  2.5 mLs (250 Units total) by Intracatheter route 2 (two) times daily.     HUMIRA 40 MG/0.8ML injection  Generic drug:  adalimumab  Inject 40 mg into the skin every 14 (fourteen) days.     hydrochlorothiazide 25 MG tablet  Commonly known as:  HYDRODIURIL  Take 25 mg by mouth daily before breakfast.     HYDROmorphone 2 MG tablet  Commonly known as:  DILAUDID  Take one tablet by mouth every four hours as needed     losartan 50 MG tablet  Commonly known as:  COZAAR  TAKE 1 TABLET BY MOUTH ONCE A DAY     OxyCODONE 40 mg T12a 12 hr tablet  Commonly known as:  OXYCONTIN  Take 1 tablet (40 mg total) by mouth every 8 (eight) hours.     pantoprazole 40 MG tablet  Commonly known as:  PROTONIX  Take 40 mg by mouth 2 (two) times daily.     potassium chloride SA 20 MEQ tablet  Commonly known as:  K-DUR,KLOR-CON  Take 20 mEq by mouth daily.     predniSONE 5 MG tablet  Commonly known as:  DELTASONE  Take 5 mg by mouth every evening.     PRESCRIPTION MEDICATION  Inject as directed once.     simvastatin 40 MG tablet  Commonly known as:  ZOCOR  Take 40 mg by mouth daily. At lunch     SYSTANE ULTRA 0.4-0.3 % Soln  Generic drug:  Polyethyl Glycol-Propyl  Glycol  Apply 2 drops to eye 2 (two) times daily as needed (dry eyes).     traZODone 100 MG tablet  Commonly known as:  DESYREL  Take 100 mg by mouth at bedtime. Take just before bedtime     zolpidem 5 MG tablet  Commonly known as:  AMBIEN  Take 1 tablet (5 mg total) by mouth  at bedtime as needed for sleep.         SignedConsuella Lose, C 04/06/2013, 10:46 AM

## 2013-04-06 NOTE — Progress Notes (Signed)
Start arranged with [facility] as requested; patient is going home on IV antibiotics- HHRN is aware/ [facility] with [place] talked to the patient yesterday for arrangements for discharge home today for start up time with IV antibiotics. Mindi Slicker RN,BSN,MHA

## 2013-04-06 NOTE — Consult Note (Signed)
Reason for Consult: Right shoulder pain Referring Physician: [name] is an 67 y.o. male.  HPI: The patient is a 67 year old patient who I have treated in the past with a left shoulder hemiarthroplasty and a known acromial nonunion. He has done well with that. He recently had an extensive lumbar surgery and has been in the hospital for several weeks. He has had to pull himself up in bed and has been rolled extensively. This has caused increased left shoulder pain. It was worse several days ago and is now calming down a bit. The pain is worse with sitting and the deltoid. Worse with movement, better with rest.  Past Medical History  Diagnosis Date  . Hx of colonic polyps   . Diverticulosis   . Hypertension   . Osteoarthritis   . 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 CAD (coronary artery disease)     minimal coronary plaque in the LAD and right coronary system. 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 7-06. Last nuclear stress 11-17-06 fixed anterior/ inferior defect, no inducible ischemia, EF 81%  . Allergic rhinitis   . Hx of colonoscopy   . RA (rheumatoid arthritis)   . COPD (chronic obstructive pulmonary disease)   . PONV (postoperative nausea and vomiting)   . Dysrhythmia 01-24-12    past hx. A.Fib x1 episode-responded to med.  . Depression   . Anxiety     pt. feels its related to the medicine that he takes   . GERD (gastroesophageal reflux disease)     Past Surgical History  Procedure Laterality Date  . Appendectomy    . Inguinal hernia repair  2008    Right, left remotely  . Total knee arthroplasty      right knee partial 2002  . Tonsillectomy    . Shoulder preplacement  2008    left, partial  . Ptca w/ coated stent lad      Cx secondary to tear  . Foot surgery  11-08    for removal of bone spurs- right foot  . Reconstruction of ankle  6-09     Right- [facility]  . L4-5 diskectomy w/ fusion, cage placement and rods  12-10    [place]  . Partial knee arthroplasty      left  . Total shoulder replacement      right  . Joint replacement    . Cataract extraction, bilateral  01-24-12    Bilateral  . Orif shoulder fracture  02/06/2012    Procedure: OPEN REDUCTION INTERNAL FIXATION (ORIF) SHOULDER FRACTURE;  Surgeon: Nita Sells, MD;  Location: [facility];  Service: Orthopedics;  Laterality: Left;  ORIF of a Left Shoulder Fracture with  Iliac Crest Bone Graft aspiration   . Harvest bone graft  02/06/2012    Procedure: HARVEST ILIAC BONE GRAFT;  Surgeon: Nita Sells, MD;  Location: [facility];  Service: Orthopedics;;  bone marrow aspirqation   . Hardware removal  03/09/2012    Procedure: HARDWARE REMOVAL;  Surgeon: Nita Sells, MD;  Location: [place];  Service: Orthopedics;  Laterality: Left;  Hardware Removal from Left Shoulder  . Cardiac catheterization  2006    coronary stents- no problems  . Shoulder arthroscopy  2013  . Carpal tunnel release      R hand  . Ankle surgery Right 2008  at [facility], replacement   . Dg toes*r*      toe surgery- 07/2012  . Posterior lumbar fusion 4 level N/A 03/02/2013    Procedure: Lumbar One to Sacral One Posterior lumbar interbody fusion;  Surgeon: Floyce Stakes, MD;  Location: [place] [facility];  Service: Neurosurgery;  Laterality: N/A;  L1 to S1 Posterior lumbar interbody fusion  . Lumbar wound debridement N/A 03/17/2013    Procedure: Incision and drainage of superficial lumbar wound;  Surgeon: Floyce Stakes, MD;  Location: [place] [facility];  Service: Neurosurgery;  Laterality: N/A;  Incision and drainage of superficial lumbar wound    Family History  Problem Relation Age of Onset  . Uterine cancer Mother     survivor  . Macular degeneration Mother   . Other Mother     ankle edema  . Lung cancer Mother   . Cancer Mother     ovarian, lung  . Coronary  artery disease Father   . Hypertension Father   . Prostate cancer Father   . Colon polyps Father   . Cancer Father     prostate  . Thyroid disease Sister   . Heart attack Brother   . Hyperlipidemia Brother   . Other Brother     Schizophrenic  . Diabetes Neg Hx   . Colon cancer Neg Hx   . Esophageal cancer Neg Hx   . Rectal cancer Neg Hx   . Stomach cancer Neg Hx   . Coronary artery disease Maternal Aunt   . Heart attack Maternal Aunt   . Rheum arthritis Sister   . Hemochromatosis Sister     Social History:  reports that he has never smoked. He has never used smokeless tobacco. He reports that he does not drink alcohol or use illicit drugs.  Allergies:  Allergies  Allergen Reactions  . Morphine And Related Shortness Of Breath, Nausea And Vomiting and Swelling  . Penicillins Hives, Shortness Of Breath and Rash    REACTION: hives, breathing problems  . Percocet [Oxycodone-Acetaminophen] Anxiety    Medications: I have reviewed the patient's current medications.  No results found for this or any previous visit (from the past 48 hour(s)).  No results found.  Review of Systems  Musculoskeletal: Positive for back pain.  All other systems reviewed and are negative.   Blood pressure 170/91, pulse 91, temperature 98.8 F (37.1 C), temperature source Oral, resp. rate 20, height 5' 6.14" (1.68 m), weight 71.8 kg (158 lb 4.6 oz), SpO2 97.00%. Physical Exam  Constitutional: He is oriented to person, place, and time. He appears well-developed and well-nourished.  HENT:  Head: Atraumatic.  Eyes: EOM are normal.  Cardiovascular: Intact distal pulses.   Respiratory: Effort normal.  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 No new ecchymosis or swelling. He can reach up to about 90 forward flexion actively without significant discomfort. No crepitance or significant pain with gentle passive range of motion. No warmth erythema. Distally  grossly neurovascularly intact  Neurological: He is alert and oriented to person, place, and time.  Skin: Skin is warm and dry.  Psychiatric: He has a normal mood and affect.    Assessment/Plan: Exacerbation left shoulder pain with known history of complicated shoulder pathology He is reassured that things are already improving. This is due to overuse of the shoulder since he's been in the hospital and is requiring use of his arms more while in bed. I would not recommend injection therapy at this  point and I think that as he continues to recover he will need his arms less and this will continue to improve. He understands and agrees. I am happy to see him as an outpatient if things fail to progress as expected.  Nita Sells 04/06/2013, 10:01 AM

## 2013-04-06 NOTE — Progress Notes (Signed)
Spoke to CSM who said patient is all set up with home health and is ready to be discharged from her standpoint. IV team in room now to flush PICC

## 2013-04-07 NOTE — Progress Notes (Signed)
Received call from [facility] stated that patient stated that he was not receiving wound care from the [facility]; Clover Mealy with [facility] called, they do have the orders for wound care and will contact their office so that the patient can receive wound care as ordered; B Sanford [place] RN,BSN,MHA [phone]

## 2013-04-12 ENCOUNTER — Ambulatory Visit (INDEPENDENT_AMBULATORY_CARE_PROVIDER_SITE_OTHER): Payer: Medicare Other | Admitting: Infectious Disease

## 2013-04-12 ENCOUNTER — Inpatient Hospital Stay (HOSPITAL_COMMUNITY)
Admission: EM | Admit: 2013-04-12 | Discharge: 2013-04-24 | DRG: 862 | Disposition: A | Discharge status: Home / Self Care | Payer: Medicare Other | Attending: Internal Medicine | Admitting: Internal Medicine

## 2013-04-12 ENCOUNTER — Encounter: Payer: Self-pay | Admitting: Infectious Disease

## 2013-04-12 ENCOUNTER — Encounter (HOSPITAL_COMMUNITY): Payer: Self-pay | Admitting: Emergency Medicine

## 2013-04-12 ENCOUNTER — Emergency Department (HOSPITAL_COMMUNITY): Payer: Medicare Other

## 2013-04-12 VITALS — BP 82/68 | HR 130 | Wt 138.5 lb

## 2013-04-12 DIAGNOSIS — I10 Essential (primary) hypertension: Secondary | ICD-10-CM | POA: Diagnosis present

## 2013-04-12 DIAGNOSIS — I2510 Atherosclerotic heart disease of native coronary artery without angina pectoris: Secondary | ICD-10-CM | POA: Diagnosis present

## 2013-04-12 DIAGNOSIS — IMO0002 Reserved for concepts with insufficient information to code with codable children: Secondary | ICD-10-CM

## 2013-04-12 DIAGNOSIS — E876 Hypokalemia: Secondary | ICD-10-CM

## 2013-04-12 DIAGNOSIS — E2749 Other adrenocortical insufficiency: Secondary | ICD-10-CM | POA: Diagnosis present

## 2013-04-12 DIAGNOSIS — Z8249 Family history of ischemic heart disease and other diseases of the circulatory system: Secondary | ICD-10-CM

## 2013-04-12 DIAGNOSIS — Z9849 Cataract extraction status, unspecified eye: Secondary | ICD-10-CM

## 2013-04-12 DIAGNOSIS — Z96659 Presence of unspecified artificial knee joint: Secondary | ICD-10-CM

## 2013-04-12 DIAGNOSIS — K56609 Unspecified intestinal obstruction, unspecified as to partial versus complete obstruction: Secondary | ICD-10-CM

## 2013-04-12 DIAGNOSIS — M545 Low back pain, unspecified: Secondary | ICD-10-CM | POA: Diagnosis present

## 2013-04-12 DIAGNOSIS — J449 Chronic obstructive pulmonary disease, unspecified: Secondary | ICD-10-CM | POA: Diagnosis present

## 2013-04-12 DIAGNOSIS — D649 Anemia, unspecified: Secondary | ICD-10-CM

## 2013-04-12 DIAGNOSIS — IMO0001 Reserved for inherently not codable concepts without codable children: Secondary | ICD-10-CM

## 2013-04-12 DIAGNOSIS — T8140XA Infection following a procedure, unspecified, initial encounter: Principal | ICD-10-CM | POA: Diagnosis present

## 2013-04-12 DIAGNOSIS — A419 Sepsis, unspecified organism: Secondary | ICD-10-CM | POA: Diagnosis present

## 2013-04-12 DIAGNOSIS — T8140XD Infection following a procedure, unspecified, subsequent encounter: Secondary | ICD-10-CM

## 2013-04-12 DIAGNOSIS — Z801 Family history of malignant neoplasm of trachea, bronchus and lung: Secondary | ICD-10-CM

## 2013-04-12 DIAGNOSIS — Y831 Surgical operation with implant of artificial internal device as the cause of abnormal reaction of the patient, or of later complication, without mention of misadventure at the time of the procedure: Secondary | ICD-10-CM | POA: Diagnosis present

## 2013-04-12 DIAGNOSIS — I4891 Unspecified atrial fibrillation: Secondary | ICD-10-CM | POA: Diagnosis present

## 2013-04-12 DIAGNOSIS — F411 Generalized anxiety disorder: Secondary | ICD-10-CM | POA: Diagnosis present

## 2013-04-12 DIAGNOSIS — I959 Hypotension, unspecified: Secondary | ICD-10-CM | POA: Diagnosis present

## 2013-04-12 DIAGNOSIS — M0520 Rheumatoid vasculitis with rheumatoid arthritis of unspecified site: Secondary | ICD-10-CM

## 2013-04-12 DIAGNOSIS — R5383 Other fatigue: Secondary | ICD-10-CM | POA: Diagnosis not present

## 2013-04-12 DIAGNOSIS — R490 Dysphonia: Secondary | ICD-10-CM | POA: Diagnosis present

## 2013-04-12 DIAGNOSIS — K5730 Diverticulosis of large intestine without perforation or abscess without bleeding: Secondary | ICD-10-CM | POA: Diagnosis present

## 2013-04-12 DIAGNOSIS — Q430 Meckel's diverticulum (displaced) (hypertrophic): Secondary | ICD-10-CM

## 2013-04-12 DIAGNOSIS — J4489 Other specified chronic obstructive pulmonary disease: Secondary | ICD-10-CM | POA: Diagnosis present

## 2013-04-12 DIAGNOSIS — E785 Hyperlipidemia, unspecified: Secondary | ICD-10-CM | POA: Diagnosis present

## 2013-04-12 DIAGNOSIS — T889XXS Complication of surgical and medical care, unspecified, sequela: Secondary | ICD-10-CM

## 2013-04-12 DIAGNOSIS — M069 Rheumatoid arthritis, unspecified: Secondary | ICD-10-CM | POA: Diagnosis present

## 2013-04-12 DIAGNOSIS — Z885 Allergy status to narcotic agent status: Secondary | ICD-10-CM

## 2013-04-12 DIAGNOSIS — T814XXS Infection following a procedure, sequela: Secondary | ICD-10-CM

## 2013-04-12 DIAGNOSIS — Z79899 Other long term (current) drug therapy: Secondary | ICD-10-CM

## 2013-04-12 DIAGNOSIS — G8929 Other chronic pain: Secondary | ICD-10-CM

## 2013-04-12 DIAGNOSIS — Z8371 Family history of colonic polyps: Secondary | ICD-10-CM

## 2013-04-12 DIAGNOSIS — Z9861 Coronary angioplasty status: Secondary | ICD-10-CM

## 2013-04-12 DIAGNOSIS — F3289 Other specified depressive episodes: Secondary | ICD-10-CM | POA: Diagnosis present

## 2013-04-12 DIAGNOSIS — E43 Unspecified severe protein-calorie malnutrition: Secondary | ICD-10-CM | POA: Diagnosis present

## 2013-04-12 DIAGNOSIS — E871 Hypo-osmolality and hyponatremia: Secondary | ICD-10-CM

## 2013-04-12 DIAGNOSIS — Z880 Allergy status to penicillin: Secondary | ICD-10-CM

## 2013-04-12 DIAGNOSIS — I7789 Other specified disorders of arteries and arterioles: Secondary | ICD-10-CM

## 2013-04-12 DIAGNOSIS — B965 Pseudomonas (aeruginosa) (mallei) (pseudomallei) as the cause of diseases classified elsewhere: Secondary | ICD-10-CM

## 2013-04-12 DIAGNOSIS — I728 Aneurysm of other specified arteries: Secondary | ICD-10-CM | POA: Diagnosis present

## 2013-04-12 DIAGNOSIS — R42 Dizziness and giddiness: Secondary | ICD-10-CM

## 2013-04-12 DIAGNOSIS — R404 Transient alteration of awareness: Secondary | ICD-10-CM | POA: Diagnosis not present

## 2013-04-12 DIAGNOSIS — Z7982 Long term (current) use of aspirin: Secondary | ICD-10-CM

## 2013-04-12 DIAGNOSIS — Z8261 Family history of arthritis: Secondary | ICD-10-CM

## 2013-04-12 DIAGNOSIS — J029 Acute pharyngitis, unspecified: Secondary | ICD-10-CM | POA: Diagnosis present

## 2013-04-12 DIAGNOSIS — K219 Gastro-esophageal reflux disease without esophagitis: Secondary | ICD-10-CM | POA: Diagnosis present

## 2013-04-12 DIAGNOSIS — R5381 Other malaise: Secondary | ICD-10-CM | POA: Diagnosis not present

## 2013-04-12 DIAGNOSIS — Z860100 Personal history of colon polyps, unspecified: Secondary | ICD-10-CM

## 2013-04-12 DIAGNOSIS — F29 Unspecified psychosis not due to a substance or known physiological condition: Secondary | ICD-10-CM | POA: Diagnosis present

## 2013-04-12 DIAGNOSIS — E2740 Unspecified adrenocortical insufficiency: Secondary | ICD-10-CM | POA: Diagnosis present

## 2013-04-12 DIAGNOSIS — F329 Major depressive disorder, single episode, unspecified: Secondary | ICD-10-CM | POA: Diagnosis present

## 2013-04-12 DIAGNOSIS — Z83719 Family history of colon polyps, unspecified: Secondary | ICD-10-CM

## 2013-04-12 DIAGNOSIS — N390 Urinary tract infection, site not specified: Secondary | ICD-10-CM | POA: Diagnosis present

## 2013-04-12 DIAGNOSIS — Z800 Family history of malignant neoplasm of digestive organs: Secondary | ICD-10-CM

## 2013-04-12 DIAGNOSIS — T8149XA Infection following a procedure, other surgical site, initial encounter: Secondary | ICD-10-CM

## 2013-04-12 DIAGNOSIS — I951 Orthostatic hypotension: Secondary | ICD-10-CM

## 2013-04-12 DIAGNOSIS — Z96619 Presence of unspecified artificial shoulder joint: Secondary | ICD-10-CM

## 2013-04-12 DIAGNOSIS — Z818 Family history of other mental and behavioral disorders: Secondary | ICD-10-CM

## 2013-04-12 DIAGNOSIS — Z8042 Family history of malignant neoplasm of prostate: Secondary | ICD-10-CM

## 2013-04-12 DIAGNOSIS — Z981 Arthrodesis status: Secondary | ICD-10-CM

## 2013-04-12 DIAGNOSIS — E860 Dehydration: Secondary | ICD-10-CM | POA: Diagnosis present

## 2013-04-12 DIAGNOSIS — A498 Other bacterial infections of unspecified site: Secondary | ICD-10-CM

## 2013-04-12 DIAGNOSIS — Z9089 Acquired absence of other organs: Secondary | ICD-10-CM

## 2013-04-12 DIAGNOSIS — M1990 Unspecified osteoarthritis, unspecified site: Secondary | ICD-10-CM | POA: Diagnosis present

## 2013-04-12 LAB — CBC WITH DIFFERENTIAL/PLATELET
Basophils Absolute: 0 10*3/uL (ref 0.0–0.1)
Basophils Relative: 0 % (ref 0–1)
Eosinophils Absolute: 0.3 10*3/uL (ref 0.0–0.7)
Eosinophils Relative: 3 % (ref 0–5)
HCT: 35.8 % — ABNORMAL LOW (ref 39.0–52.0)
Hemoglobin: 11.7 g/dL — ABNORMAL LOW (ref 13.0–17.0)
Lymphocytes Relative: 13 % (ref 12–46)
Lymphs Abs: 1.1 10*3/uL (ref 0.7–4.0)
MCH: 29.2 pg (ref 26.0–34.0)
MCHC: 32.7 g/dL (ref 30.0–36.0)
MCV: 89.3 fL (ref 78.0–100.0)
Monocytes Absolute: 1.3 10*3/uL — ABNORMAL HIGH (ref 0.1–1.0)
Monocytes Relative: 15 % — ABNORMAL HIGH (ref 3–12)
Neutro Abs: 6 10*3/uL (ref 1.7–7.7)
Neutrophils Relative %: 69 % (ref 43–77)
Platelets: 304 10*3/uL (ref 150–400)
RBC: 4.01 MIL/uL — ABNORMAL LOW (ref 4.22–5.81)
RDW: 14.9 % (ref 11.5–15.5)
WBC: 8.6 10*3/uL (ref 4.0–10.5)

## 2013-04-12 LAB — URINALYSIS, ROUTINE W REFLEX MICROSCOPIC
Bilirubin Urine: NEGATIVE
Glucose, UA: NEGATIVE mg/dL
Hgb urine dipstick: NEGATIVE
Ketones, ur: NEGATIVE mg/dL
Leukocytes, UA: NEGATIVE
Nitrite: NEGATIVE
Protein, ur: NEGATIVE mg/dL
Specific Gravity, Urine: 1.017 (ref 1.005–1.030)
Urobilinogen, UA: 0.2 mg/dL (ref 0.0–1.0)
pH: 5.5 (ref 5.0–8.0)

## 2013-04-12 LAB — COMPREHENSIVE METABOLIC PANEL
ALT: 11 U/L (ref 0–53)
AST: 16 U/L (ref 0–37)
Albumin: 2.9 g/dL — ABNORMAL LOW (ref 3.5–5.2)
Alkaline Phosphatase: 121 U/L — ABNORMAL HIGH (ref 39–117)
BUN: 15 mg/dL (ref 6–23)
CO2: 25 mEq/L (ref 19–32)
Calcium: 8.6 mg/dL (ref 8.4–10.5)
Chloride: 93 mEq/L — ABNORMAL LOW (ref 96–112)
Creatinine, Ser: 0.83 mg/dL (ref 0.50–1.35)
GFR calc Af Amer: >90 mL/min (ref >90)
GFR calc non Af Amer: >90 mL/min (ref >90)
Glucose, Bld: 111 mg/dL — ABNORMAL HIGH (ref 70–99)
Potassium: 4 mEq/L (ref 3.7–5.3)
Sodium: 130 mEq/L — ABNORMAL LOW (ref 137–147)
Total Bilirubin: 0.4 mg/dL (ref 0.3–1.2)
Total Protein: 6.5 g/dL (ref 6.0–8.3)

## 2013-04-12 LAB — CG4 I-STAT (LACTIC ACID): Lactic Acid, Venous: 1.47 mmol/L (ref 0.5–2.2)

## 2013-04-12 MED ORDER — HYDROCORTISONE SOD SUCCINATE 100 MG IJ SOLR
100.0000 mg | Freq: Once | INTRAMUSCULAR | Status: AC
  Administered 2013-04-12: 23:00:00 via INTRAVENOUS
  Filled 2013-04-12: qty 2

## 2013-04-12 MED ORDER — FENTANYL CITRATE 0.05 MG/ML IJ SOLN
25.0000 ug | Freq: Once | INTRAMUSCULAR | Status: AC
  Administered 2013-04-13: 25 ug via INTRAVENOUS
  Filled 2013-04-12: qty 2

## 2013-04-12 MED ORDER — SODIUM CHLORIDE 0.9 % IV SOLN: 1000.0000 mL | Freq: Once | INTRAVENOUS | Status: DC

## 2013-04-12 MED ORDER — SODIUM CHLORIDE 0.9 % IV SOLN: 1000.0000 mL | INTRAVENOUS | Status: DC

## 2013-04-12 MED ORDER — SODIUM CHLORIDE 0.9 % IV BOLUS (SEPSIS)
1000.0000 mL | Freq: Once | INTRAVENOUS | Status: AC
  Administered 2013-04-12: 1000 mL via INTRAVENOUS

## 2013-04-12 NOTE — Patient Instructions (Signed)
I am very concerned about your low blood pressure  I feel strongly we need to bring you the ER to triage you with stat blood work :  cbc , cmp, esr, crp), lactic acid  blood cultures x 2, urine analysis, cortisol level  I think you clearly need IV fluids  I am also concerned that we need to image your spine again and your left shoulder to exclude deep infection at either of these sites

## 2013-04-12 NOTE — ED Provider Notes (Signed)
CSN: [id number]     Arrival date & time 04/12/13  1601 History   First MD Initiated Contact with Patient 04/12/13 2113     Chief Complaint  Patient presents with  . Wound Infection   (Consider location/radiation/quality/duration/timing/severity/associated sxs/prior Treatment) The history is provided by the patient, the spouse and medical records.   Patient sent in to the ED by Dr Tommy Medal.  Patient had lumbar surgery by Dr Joya Salm approximately 5 weeks ago and has since developed a postoperative wound infection that is positive for pseudomonas.  He has been receiving outpatient IV cefepime by PICC line at home and followed up with Dr Tommy Medal today.  Per wife, patient's wound has gotten progressively worse, he has gotten progressively more weak, has been intermittently confused for the past two days, and today patient states he had difficulty urinating and his urine was very dark.  Dr Tommy Medal found that patient was hypotensive and patient was weak, dehydrated, and having tremors. Denies recent fevers, chills, CP, SOB, cough, abdominal pain, N/V/D, leg swelling.  States he has been eating and drinking the best he can.      Past Medical History  Diagnosis Date  . Hx of colonic polyps   . Diverticulosis   . Hypertension   . Osteoarthritis   . 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 CAD (coronary artery disease)     minimal coronary plaque in the LAD and right coronary system. 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 7-06. Last nuclear stress 11-17-06 fixed anterior/ inferior defect, no inducible ischemia, EF 81%  . Allergic rhinitis   . Hx of colonoscopy   . RA (rheumatoid arthritis)   . COPD (chronic obstructive pulmonary disease)   . PONV (postoperative nausea and vomiting)   . Dysrhythmia 01-24-12    past hx. A.Fib x1 episode-responded to med.  . Depression   . Anxiety     pt.  feels its related to the medicine that he takes   . GERD (gastroesophageal reflux disease)    Past Surgical History  Procedure Laterality Date  . Appendectomy    . Inguinal hernia repair  2008    Right, left remotely  . Total knee arthroplasty      right knee partial 2002  . Tonsillectomy    . Shoulder preplacement  2008    left, partial  . Ptca w/ coated stent lad      Cx secondary to tear  . Foot surgery  11-08    for removal of bone spurs- right foot  . Reconstruction of ankle  6-09    Right- [facility]  . L4-5 diskectomy w/ fusion, cage placement and rods  12-10    [place]  . Partial knee arthroplasty      left  . Total shoulder replacement      right  . Joint replacement    . Cataract extraction, bilateral  01-24-12    Bilateral  . Orif shoulder fracture  02/06/2012    Procedure: OPEN REDUCTION INTERNAL FIXATION (ORIF) SHOULDER FRACTURE;  Surgeon: Nita Sells, MD;  Location: [facility];  Service: Orthopedics;  Laterality: Left;  ORIF of a Left Shoulder Fracture with  Iliac Crest Bone Graft aspiration   . Harvest bone graft  02/06/2012    Procedure: HARVEST ILIAC BONE GRAFT;  Surgeon: Nita Sells, MD;  Location: [facility];  Service: Orthopedics;;  bone marrow aspirqation   . Hardware removal  03/09/2012    Procedure: HARDWARE REMOVAL;  Surgeon: Nita Sells, MD;  Location: [facility];  Service: Orthopedics;  Laterality: Left;  Hardware Removal from Left Shoulder  . Cardiac catheterization  2006    coronary stents- no problems  . Shoulder arthroscopy  2013  . Carpal tunnel release      R hand  . Ankle surgery Right 2008    at [facility], replacement   . Dg toes*r*      toe surgery- 07/2012  . Posterior lumbar fusion 4 level N/A 03/02/2013    Procedure: Lumbar One to Sacral One Posterior lumbar interbody fusion;  Surgeon: Floyce Stakes, MD;  Location: [place] [facility];  Service: Neurosurgery;  Laterality: N/A;  L1 to S1 Posterior lumbar  interbody fusion  . Lumbar wound debridement N/A 03/17/2013    Procedure: Incision and drainage of superficial lumbar wound;  Surgeon: Floyce Stakes, MD;  Location: [place] [facility];  Service: Neurosurgery;  Laterality: N/A;  Incision and drainage of superficial lumbar wound   Family History  Problem Relation Age of Onset  . Uterine cancer Mother     survivor  . Macular degeneration Mother   . Other Mother     ankle edema  . Lung cancer Mother   . Cancer Mother     ovarian, lung  . Coronary artery disease Father   . Hypertension Father   . Prostate cancer Father   . Colon polyps Father   . Cancer Father     prostate  . Thyroid disease Sister   . Heart attack Brother   . Hyperlipidemia Brother   . Other Brother     Schizophrenic  . Diabetes Neg Hx   . Colon cancer Neg Hx   . Esophageal cancer Neg Hx   . Rectal cancer Neg Hx   . Stomach cancer Neg Hx   . Coronary artery disease Maternal Aunt   . Heart attack Maternal Aunt   . Rheum arthritis Sister   . Hemochromatosis Sister    History  Substance Use Topics  . Smoking status: Never Smoker   . Smokeless tobacco: Never Used  . Alcohol Use: No    Review of Systems  Constitutional: Negative for fever and chills.  Respiratory: Negative for cough and shortness of breath.   Cardiovascular: Negative for chest pain.  Gastrointestinal: Negative for nausea, vomiting, abdominal pain and diarrhea.  Genitourinary: Positive for decreased urine volume. Negative for dysuria, urgency and frequency.  Musculoskeletal: Positive for back pain.  Skin: Positive for wound.  Neurological: Positive for weakness and light-headedness.  Psychiatric/Behavioral: Positive for confusion.  All other systems reviewed and are negative.    Allergies  Morphine and related; Penicillins; and Percocet  Home Medications   Current Outpatient Rx  Name  Route  Sig  Dispense  Refill  . Aclidinium Bromide (TUDORZA PRESSAIR) 400 MCG/ACT AEPB    Inhalation   Inhale 1 puff into the lungs 2 (two) times daily.   1 each   11   . adalimumab (HUMIRA) 40 MG/0.8ML injection   Subcutaneous   Inject 40 mg into the skin every 14 (fourteen) days.         . ALPRAZolam (XANAX) 0.5 MG tablet   Oral   Take 1 tablet (0.5 mg total) by mouth 3 (three) times daily as needed for anxiety or sleep.   90 tablet   5   .  aspirin (ASPIRIN EC) 81 MG EC tablet   Oral   Take 81 mg by mouth every morning.          . celecoxib (CELEBREX) 200 MG capsule   Oral   Take 200 mg by mouth 2 (two) times daily.         . cholecalciferol (VITAMIN D) 1000 UNITS tablet   Oral   Take 1 tablet (1,000 Units total) by mouth daily.   100 tablet   3   . colchicine 0.6 MG tablet   Oral   Take 0.6 mg by mouth 2 (two) times daily.         . diazepam (VALIUM) 5 MG tablet   Oral   Take 1 tablet (5 mg total) by mouth every 6 (six) hours as needed for muscle spasms.   30 tablet   0   . docusate sodium (COLACE) 100 MG capsule   Oral   Take 100 mg by mouth daily as needed for mild constipation.         [name] FLUoxetine (PROZAC) 40 MG capsule   Oral   Take 40 mg by mouth 2 (two) times daily.         . fluticasone (FLONASE) 50 MCG/ACT nasal spray   Each Nare   Place 2 sprays into both nostrils daily.         . furosemide (LASIX) 20 MG tablet   Oral   Take 20 mg by mouth daily.         [name] gabapentin (NEURONTIN) 300 MG capsule   Oral   Take 300-600 mg by mouth 4 (four) times daily. 600 mg every morning, 300 mg at noon, 600 mg at bedtime, and 300 mg at 1 AM         . hydrochlorothiazide (HYDRODIURIL) 25 MG tablet   Oral   Take 25 mg by mouth daily before breakfast.          . HYDROmorphone (DILAUDID) 4 MG tablet   Oral   Take 4 mg by mouth 2 (two) times daily as needed for moderate pain or severe pain.         [name] losartan (COZAAR) 50 MG tablet   Oral   Take 50 mg by mouth daily.         . Melatonin ([employer] MELATONIN) 5 MG TABS    Oral   Take 5 mg by mouth at bedtime.          . Nutritional Supplements (ENSURE COMPLETE PO)   Oral   Take 0.5 Cans by mouth 2 (two) times daily.         . OxyCODONE (OXYCONTIN) 40 mg T12A 12 hr tablet   Oral   Take 40 mg by mouth every 8 (eight) hours as needed (pain).         . pantoprazole (PROTONIX) 40 MG tablet   Oral   Take 40 mg by mouth 2 (two) times daily.          Vladimir Faster Glycol-Propyl Glycol (SYSTANE ULTRA) 0.4-0.3 % SOLN   Both Eyes   Place 2 drops into both eyes 2 (two) times daily as needed (dry eyes).          . potassium chloride SA (K-DUR,KLOR-CON) 20 MEQ tablet   Oral   Take 20 mEq by mouth daily.         . predniSONE (DELTASONE) 5 MG tablet   Oral   Take 5 mg by mouth every evening.          [name]  simvastatin (ZOCOR) 40 MG tablet   Oral   Take 40 mg by mouth daily. At lunch         . traZODone (DESYREL) 100 MG tablet   Oral   Take 100 mg by mouth at bedtime. Take just before bedtime         . zolpidem (AMBIEN) 5 MG tablet   Oral   Take 1 tablet (5 mg total) by mouth at bedtime as needed for sleep.   30 tablet   0    BP 116/70  Pulse 120  Temp(Src) 97.9 F (36.6 C) (Oral)  Resp 19  Wt 137 lb (62.143 kg)  SpO2 98% Physical Exam  Nursing note and vitals reviewed. Constitutional: He is cooperative. He has a sickly appearance. No distress.  Thin, frail, weak appearing, speaks in whisper  HENT:  Head: Normocephalic and atraumatic.  Neck: Neck supple.  Cardiovascular: Normal rate, regular rhythm and intact distal pulses.   Pulmonary/Chest: Effort normal and breath sounds normal. No respiratory distress. He has no wheezes. He has no rales.  Abdominal: Soft. He exhibits no distension and no mass. There is no tenderness. There is no rebound and no guarding.  Musculoskeletal: He exhibits no edema.  Left shoulder without erythema, edema, warmth, tenderness  Neurological: He is alert. He exhibits normal muscle tone.  Skin: He is  not diaphoretic.     Psychiatric: He has a normal mood and affect. His behavior is normal.    ED Course  Procedures (including critical care time) Labs Review Labs Reviewed  CBC WITH DIFFERENTIAL - Abnormal; Notable for the following:    RBC 4.01 (*)    Hemoglobin 11.7 (*)    HCT 35.8 (*)    Monocytes Relative 15 (*)    Monocytes Absolute 1.3 (*)    All other components within normal limits  COMPREHENSIVE METABOLIC PANEL - Abnormal; Notable for the following:    Sodium 130 (*)    Chloride 93 (*)    Glucose, Bld 111 (*)    Albumin 2.9 (*)    Alkaline Phosphatase 121 (*)    All other components within normal limits  CULTURE, BLOOD (ROUTINE X 2)  CULTURE, BLOOD (ROUTINE X 2)  URINE CULTURE  URINALYSIS, ROUTINE W REFLEX MICROSCOPIC  CORTISOL  CG4 I-STAT (LACTIC ACID)   Imaging Review Dg Chest 2 View  04/12/2013   CLINICAL DATA:  Wound infection  EXAM: CHEST  2 VIEW  COMPARISON:  March 22, 2013  FINDINGS: There is stable elevation of the right hemidiaphragm. There is patchy atelectasis in the right base. Lungs elsewhere are clear. Heart size and pulmonary vascularity are normal. No adenopathy.  Central catheter tip is at the cavoatrial junction. No pneumothorax. 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 There is postoperative change in the lumbar spine.  IMPRESSION: Stable elevation of the right hemidiaphragm. Persistent right base atelectasis. No new opacity seen.   Electronically Signed   By: Lowella Grip M.D.   On: 04/12/2013 23:04    EKG Interpretation   None       MDM   1. Sepsis   2. Postoperative infection, subsequent encounter   3. Hypotension   4. Adrenal insufficiency   5. Chronic pain   6. Dehydration      Patient with lumbar surgery 03/18/13 with postoperative infection, grew pseudomonas that was pan sensitive, has been treated with cefipime through PICC at home, gradually worsening, seen by ID Tommy Medal) today and found to be hypotensive,  weak,  dehydrated.  Sent to ED suspecting PICC line infection vs lumbar infection. He is septic here- tachycardic, febrile, hypotensive.   CXR negative.  UA negative. Antibiotics ordered per Dr. Lucianne Lei Dam's note.  Admitted 2 [facility] hospitalist Dr. Trixie Deis, PA-C 04/13/13 [phone]

## 2013-04-12 NOTE — Progress Notes (Signed)
Subjective:    Patient ID: [name], male    DOB: Oct 31, 1945, 68 y.o.   MRN: [id number]  HPI  68 yo M with a hx of hemochromatosis, HTN COPD, RA and lumbar pain. He had undergone L4-5 fusion (2010). He returned to the hospital on 03-02-13 and underwent L1-S1 decompression and fusion. His post-op course was complicated by refractory hypotension and he required pressor support. By 11-28 he was able to transfer out of ICU. He also had difficulty with radiated pain post-op and was given as well as the as well as a course of decadronHe was d/c to SNF on 12-3  He returned to [facility] on 12-9 with fever to 102 at home, purulent wound drainage and erythema. He also had become confused. He was noted to have cough productive of thick yellow sputum and hyponatremia (120). He was started on vancomycin. On 12-10 he was taken to OR and had debridement of his wound.  He appears tohave had wound swab sent 12-9 which grew P aeruginosa (pan-sens). A repeat Cx done on 12-10 is also growing P aeruginosa. His UCx from admission is growing K pneumo (R-nitro).and pan sensitive pseudomonas. He was given IV cefepime and dc to home with IV cefepime 1g iv q 12 hours> During his hospital stay his course was complicated by diarrhea apparently due to colchicine therapy. He also developed left-sided shoulder pain and was seen by Dr. Tamera Punt who felt that the patient increase shoulder pain was due to over use of his arms while he was in bed.  He was discharged home on IV cefepime but had not yet had to work done outside the hospital. He has been receiving antibiotics by [place] home care.  He has been feeling less well the last several days with severe dry mouth dizziness and lightheadedness with standing reduced urination as well as tremors.   The patient denies N, V diarrhea. HE has not had fever. He has had drainage from his lumbar wound but no frank pus observed.   He denies dysuria or polyuria, no cough.   Her in  clinic his BP systolic was int he 56Y and his HR in the 130s while seated.    In talking to the patient and his wife the wife states that she has not been giving him his antihypertensive medications but that upon further questioning of the patient he appears himself to taken his blood pressure medications thinking that he still needed them.  He is still taking his maintenance dose of prednisone for his RA   Review of Systems  Constitutional: Positive for activity change and fatigue. Negative for fever, chills, diaphoresis, appetite change and unexpected weight change.  HENT: Positive for sore throat and voice change. Negative for congestion, rhinorrhea, sinus pressure, sneezing and trouble swallowing.   Respiratory: Negative for cough, chest tightness, shortness of breath, wheezing and stridor.   Cardiovascular: Negative for chest pain, palpitations and leg swelling.  Gastrointestinal: Negative for nausea, vomiting, abdominal pain, diarrhea, constipation, blood in stool, abdominal distention and anal bleeding.  Genitourinary: Positive for decreased urine volume. Negative for dysuria, hematuria, flank pain and difficulty urinating.  Musculoskeletal: Negative for arthralgias, back pain, gait problem, joint swelling and myalgias.  Skin: Positive for wound. Negative for color change, pallor and rash.  Neurological: Positive for tremors and weakness. Negative for dizziness and light-headedness.  Hematological: Negative for adenopathy. Does not bruise/bleed easily.  Psychiatric/Behavioral: Negative for behavioral problems, confusion, sleep disturbance, dysphoric mood, decreased concentration and agitation.  Objective:   Physical Exam  Constitutional: He is oriented to person, place, and time. No distress.  HENT:  Head: Normocephalic and atraumatic.  Mouth/Throat: Posterior oropharyngeal edema present. No oropharyngeal exudate.  Eyes: Conjunctivae and EOM are normal. Pupils are equal,  round, and reactive to light. No scleral icterus.  Neck: Normal range of motion. Neck supple. No JVD present.  Cardiovascular: Regular rhythm and normal heart sounds.  Tachycardia present.  Exam reveals no gallop and no friction rub.   No murmur heard. Pulmonary/Chest: Effort normal and breath sounds normal. No respiratory distress. He has no wheezes. He has no rales. He exhibits no tenderness.  Abdominal: He exhibits no distension and no mass. There is no tenderness. There is no rebound and no guarding.  Musculoskeletal: He exhibits no edema and no tenderness.       Back:       Arms: Lymphadenopathy:    He has no cervical adenopathy.  Neurological: He is alert and oriented to person, place, and time. He exhibits normal muscle tone. Coordination normal.  Skin: Skin is warm and dry. He is not diaphoretic. There is erythema. No pallor.     Psychiatric: He has a normal mood and affect. His behavior is normal. Judgment and thought content normal.           PICC is clean and without evidence of infection, minor erythema around the insertion site     Lumbar wound with copious drainage but no frank pus, beefy granulation tissue seen       Assessment & Plan:   #1 Low blood pressure: This certainly could be potentially iatrogenic IF he started BP meds that were no longer appropriate for him given his extensive hospital stay and deconditioning. The wife is not been giving this to him but patient thinks he took these medicines last night.  Sides antihypertensive medications being potentially responsible I. of course am worried about infectious causes of his presentation including persistent infection at that surgical site or more specifically a deeper infection to the surgical site. Also would worry about other complications such as a bloodstream infection from infected PICC line.  His left prosthetic shoulder certainly could also be infected although Dr. Tamera Punt felt a bit improved  compared with prior exam.  We are redressing wound and bring him to the emergency department for triaging.  Clearly needs stat blood work I would recommend getting a stat CBC with differential stat compress a monoblock panel stat lactic acid level stat cortisol level II sets of blood cultures from peripheral sites--not from PICC site  Urinalysis clean-catch  Chest x-ray  He clearly needs IV fluids to rehydrate him and correct his hypotension  He may need stress dose steroids or at least increase in his basal steroids  He may VERY well need MRI of the L spine and the Left shoudler done to investigate for deep infection at either of these sites   I spent greater than 45 minutes with the patient including greater than 50% of time in face to face counsel of the patient and in coordination of their care.   #2 LUmbar with Pseudomonas Aeruginosa:   ONce we have established the pts renal function I would PUSH the dose to more aggressive dose (for example 2 g IV q 8 for normal renal function

## 2013-04-12 NOTE — ED Notes (Signed)
Per EMS: pt from PCP office with low BP in office now resolved; pt with current post op wound infection to back; pt c/o surgical site back pain; pt sts poor PO intake x 3 days

## 2013-04-13 ENCOUNTER — Encounter (HOSPITAL_COMMUNITY): Payer: Self-pay | Admitting: Internal Medicine

## 2013-04-13 DIAGNOSIS — E860 Dehydration: Secondary | ICD-10-CM

## 2013-04-13 DIAGNOSIS — I959 Hypotension, unspecified: Secondary | ICD-10-CM

## 2013-04-13 DIAGNOSIS — E2749 Other adrenocortical insufficiency: Secondary | ICD-10-CM

## 2013-04-13 DIAGNOSIS — A419 Sepsis, unspecified organism: Secondary | ICD-10-CM | POA: Diagnosis present

## 2013-04-13 DIAGNOSIS — E2740 Unspecified adrenocortical insufficiency: Secondary | ICD-10-CM | POA: Diagnosis present

## 2013-04-13 DIAGNOSIS — IMO0002 Reserved for concepts with insufficient information to code with codable children: Secondary | ICD-10-CM

## 2013-04-13 DIAGNOSIS — G8929 Other chronic pain: Secondary | ICD-10-CM

## 2013-04-13 LAB — CORTISOL
Cortisol, Plasma: 17 ug/dL
Cortisol, Plasma: 40.1 ug/dL

## 2013-04-13 LAB — TSH: TSH: 0.22 u[IU]/mL — ABNORMAL LOW (ref 0.350–4.500)

## 2013-04-13 LAB — MRSA PCR SCREENING: MRSA by PCR: NEGATIVE

## 2013-04-13 MED ORDER — ONDANSETRON HCL 4 MG PO TABS
4.0000 mg | ORAL_TABLET | Freq: Four times a day (QID) | ORAL | Status: DC | PRN
  Administered 2013-04-20: 4 mg via ORAL
  Filled 2013-04-13: qty 1

## 2013-04-13 MED ORDER — DOCUSATE SODIUM 100 MG PO CAPS
100.0000 mg | ORAL_CAPSULE | Freq: Two times a day (BID) | ORAL | Status: DC
  Administered 2013-04-14 – 2013-04-18 (×10): 100 mg via ORAL
  Filled 2013-04-13 (×12): qty 1

## 2013-04-13 MED ORDER — PANTOPRAZOLE SODIUM 40 MG PO TBEC
40.0000 mg | DELAYED_RELEASE_TABLET | Freq: Two times a day (BID) | ORAL | Status: DC
  Administered 2013-04-13 – 2013-04-18 (×11): 40 mg via ORAL
  Filled 2013-04-13 (×11): qty 1

## 2013-04-13 MED ORDER — TIOTROPIUM BROMIDE MONOHYDRATE 18 MCG IN CAPS
18.0000 ug | ORAL_CAPSULE | Freq: Every day | RESPIRATORY_TRACT | Status: DC
  Administered 2013-04-14 – 2013-04-24 (×9): 18 ug via RESPIRATORY_TRACT
  Filled 2013-04-13 (×2): qty 5

## 2013-04-13 MED ORDER — TRAZODONE HCL 100 MG PO TABS
100.0000 mg | ORAL_TABLET | Freq: Every day | ORAL | Status: DC
  Administered 2013-04-13 – 2013-04-23 (×11): 100 mg via ORAL
  Filled 2013-04-13 (×13): qty 1

## 2013-04-13 MED ORDER — MELATONIN 5 MG PO TABS: 5.0000 mg | ORAL_TABLET | Freq: Every day | ORAL | Status: DC

## 2013-04-13 MED ORDER — FLUTICASONE PROPIONATE 50 MCG/ACT NA SUSP
2.0000 | Freq: Every day | NASAL | Status: DC
  Administered 2013-04-13 – 2013-04-24 (×12): 2 via NASAL
  Filled 2013-04-13 (×2): qty 16

## 2013-04-13 MED ORDER — ZOLPIDEM TARTRATE 5 MG PO TABS
5.0000 mg | ORAL_TABLET | Freq: Every evening | ORAL | Status: DC | PRN
  Administered 2013-04-15 – 2013-04-18 (×5): 5 mg via ORAL
  Filled 2013-04-13 (×5): qty 1

## 2013-04-13 MED ORDER — TIOTROPIUM BROMIDE MONOHYDRATE 18 MCG IN CAPS
18.0000 ug | ORAL_CAPSULE | Freq: Every day | RESPIRATORY_TRACT | Status: DC
  Filled 2013-04-13: qty 5

## 2013-04-13 MED ORDER — DOCUSATE SODIUM 100 MG PO CAPS
100.0000 mg | ORAL_CAPSULE | Freq: Every day | ORAL | Status: DC | PRN
  Administered 2013-04-15: 100 mg via ORAL
  Filled 2013-04-13 (×2): qty 1

## 2013-04-13 MED ORDER — HYDROMORPHONE HCL PF 1 MG/ML IJ SOLN
INTRAMUSCULAR | Status: AC
  Filled 2013-04-13: qty 1

## 2013-04-13 MED ORDER — DIAZEPAM 5 MG PO TABS
5.0000 mg | ORAL_TABLET | Freq: Four times a day (QID) | ORAL | Status: DC | PRN
  Administered 2013-04-13 – 2013-04-24 (×22): 5 mg via ORAL
  Filled 2013-04-13 (×22): qty 1

## 2013-04-13 MED ORDER — VITAMIN D3 25 MCG (1000 UNIT) PO TABS
1000.0000 [IU] | ORAL_TABLET | Freq: Every day | ORAL | Status: DC
  Administered 2013-04-13 – 2013-04-24 (×11): 1000 [IU] via ORAL
  Filled 2013-04-13 (×12): qty 1

## 2013-04-13 MED ORDER — DIAZEPAM 5 MG PO TABS
ORAL_TABLET | ORAL | Status: AC
Start: 2013-04-13 — End: 2013-04-13
  Filled 2013-04-13: qty 1

## 2013-04-13 MED ORDER — ONDANSETRON HCL 4 MG/2ML IJ SOLN
4.0000 mg | Freq: Four times a day (QID) | INTRAMUSCULAR | Status: DC | PRN
  Administered 2013-04-18 – 2013-04-22 (×12): 4 mg via INTRAVENOUS
  Filled 2013-04-13 (×12): qty 2

## 2013-04-13 MED ORDER — HYDROCORTISONE SOD SUCCINATE 100 MG IJ SOLR
INTRAMUSCULAR | Status: AC
Start: 2013-04-13 — End: 2013-04-13
  Filled 2013-04-13: qty 2

## 2013-04-13 MED ORDER — POLYETHYL GLYCOL-PROPYL GLYCOL 0.4-0.3 % OP SOLN: 2.0000 [drp] | Freq: Two times a day (BID) | OPHTHALMIC | Status: DC | PRN

## 2013-04-13 MED ORDER — COLCHICINE 0.6 MG PO TABS
0.6000 mg | ORAL_TABLET | Freq: Two times a day (BID) | ORAL | Status: DC
  Administered 2013-04-13 – 2013-04-19 (×12): 0.6 mg via ORAL
  Filled 2013-04-13 (×15): qty 1

## 2013-04-13 MED ORDER — ENSURE COMPLETE PO LIQD
237.0000 mL | Freq: Two times a day (BID) | ORAL | Status: DC
  Administered 2013-04-14 – 2013-04-21 (×8): 237 mL via ORAL

## 2013-04-13 MED ORDER — GABAPENTIN 600 MG PO TABS
600.0000 mg | ORAL_TABLET | Freq: Two times a day (BID) | ORAL | Status: DC
  Administered 2013-04-13 – 2013-04-19 (×12): 600 mg via ORAL
  Filled 2013-04-13 (×15): qty 1

## 2013-04-13 MED ORDER — POLYVINYL ALCOHOL 1.4 % OP SOLN
2.0000 [drp] | Freq: Two times a day (BID) | OPHTHALMIC | Status: DC | PRN
  Filled 2013-04-13: qty 15

## 2013-04-13 MED ORDER — SODIUM CHLORIDE 0.9 % IJ SOLN
3.0000 mL | Freq: Two times a day (BID) | INTRAMUSCULAR | Status: DC
  Administered 2013-04-13 – 2013-04-15 (×4): 3 mL via INTRAVENOUS
  Administered 2013-04-16: 12:00:00 via INTRAVENOUS
  Administered 2013-04-16 – 2013-04-20 (×2): 3 mL via INTRAVENOUS

## 2013-04-13 MED ORDER — ASPIRIN EC 81 MG PO TBEC
81.0000 mg | DELAYED_RELEASE_TABLET | Freq: Every morning | ORAL | Status: DC
  Administered 2013-04-14 – 2013-04-18 (×5): 81 mg via ORAL
  Filled 2013-04-13 (×7): qty 1

## 2013-04-13 MED ORDER — HYDROMORPHONE HCL PF 1 MG/ML IJ SOLN
1.0000 mg | INTRAMUSCULAR | Status: DC | PRN
  Administered 2013-04-13 – 2013-04-23 (×43): 1 mg via INTRAVENOUS
  Filled 2013-04-13 (×44): qty 1

## 2013-04-13 MED ORDER — SIMVASTATIN 40 MG PO TABS
40.0000 mg | ORAL_TABLET | Freq: Every day | ORAL | Status: DC
  Administered 2013-04-13 – 2013-04-24 (×11): 40 mg via ORAL
  Filled 2013-04-13 (×12): qty 1

## 2013-04-13 MED ORDER — GABAPENTIN 300 MG PO CAPS
300.0000 mg | ORAL_CAPSULE | Freq: Two times a day (BID) | ORAL | Status: DC
  Administered 2013-04-14 – 2013-04-19 (×9): 300 mg via ORAL
  Filled 2013-04-13 (×13): qty 1

## 2013-04-13 MED ORDER — HYDROCORTISONE SOD SUCCINATE 100 MG IJ SOLR
50.0000 mg | Freq: Three times a day (TID) | INTRAMUSCULAR | Status: AC
  Administered 2013-04-13: 50 mg via INTRAVENOUS

## 2013-04-13 MED ORDER — DEXTROSE 5 % IV SOLN
2.0000 g | Freq: Three times a day (TID) | INTRAVENOUS | Status: DC
  Administered 2013-04-13 – 2013-04-24 (×36): 2 g via INTRAVENOUS
  Filled 2013-04-13 (×38): qty 2

## 2013-04-13 MED ORDER — GABAPENTIN 300 MG PO CAPS: 300.0000 mg | ORAL_CAPSULE | Freq: Four times a day (QID) | ORAL | Status: DC

## 2013-04-13 MED ORDER — FLUOXETINE HCL 20 MG PO CAPS
40.0000 mg | ORAL_CAPSULE | Freq: Two times a day (BID) | ORAL | Status: DC
  Administered 2013-04-13 – 2013-04-20 (×12): 40 mg via ORAL
  Filled 2013-04-13 (×16): qty 2

## 2013-04-13 MED ORDER — PREDNISONE 20 MG PO TABS
40.0000 mg | ORAL_TABLET | Freq: Every day | ORAL | Status: AC
  Administered 2013-04-13 – 2013-04-15 (×3): 40 mg via ORAL
  Filled 2013-04-13 (×3): qty 2

## 2013-04-13 MED ORDER — ALPRAZOLAM 0.25 MG PO TABS
0.5000 mg | ORAL_TABLET | Freq: Three times a day (TID) | ORAL | Status: DC | PRN
  Administered 2013-04-16 – 2013-04-17 (×2): 0.5 mg via ORAL
  Filled 2013-04-13 (×2): qty 2

## 2013-04-13 NOTE — Consult Note (Addendum)
WOC wound consult note Reason for Consult: Pt familiar to [facility] team from previous admission; refer to [facility] consult note from 12/23. Pt has a full thickness post-op wound from several weeks ago; he has been followed as an outpatient by Dr Ponciano Ort the neurosurgeon he states. Wound type: Full thickness Measurement: 7X2.5X.8cm with undermining to 1 cm from 12:00 o'clock to 6:00 o'clock Wound bed: 80% beefy red, 10% yellow, 10% bone palpable with swab. Drainage (amount, consistency, odor) Mod amt yellow drainage, no odor. Periwound: Generalized erythremia. Dressing procedure/placement/frequency: Packed with Aquacel to absorb drainage and provide antimicrobial benefits.  Recommend assessment by Dr Ponciano Ort for further plan of care. Please re-consult if further assistance is needed.  Thank-you,  Julien Girt MSN, [place], [place], [place], [place]

## 2013-04-13 NOTE — ED Notes (Signed)
[name]- wife:  -home [place] -cell phone [phone]

## 2013-04-13 NOTE — Progress Notes (Signed)
Unit CM UR Completed by [facility] ED CM  W. Halen Antenucci RN  

## 2013-04-13 NOTE — Progress Notes (Signed)
[facility] PROGRESS NOTE  [name] TXL:[id number] DOB: 14-Dec-1945 DOA: 04/12/2013 PCP: Adella Hare, MD  Patient is seen examined in ED; please see full H&P for management and plan  Awaiting to TF to [place], [facility] Pager [phone]. If 7PM-7AM, please contact night-coverage at [email or web address], password [facility] 04/13/2013, 12:20 PM  LOS: 1 day

## 2013-04-13 NOTE — Progress Notes (Signed)
68yo male had low BP during OP OV, has post-op wound infection to back, to begin IV ABX.  Will start cefepime 2g IV Q8H for CrCl 80 ml/min and monitor CBC and Cx.  Wynona Neat, PharmD, BCPS  04/13/2013 12:12 AM

## 2013-04-13 NOTE — H&P (Signed)
[facility] History and Physical  WESTEN DININO QQP:[id number] DOB: 1946/01/11    PCP:   Adella Hare, MD   Chief Complaint:  Sent in from Dr Earl Gala office for possible sepsis.  HPI: [name] is an 68 y.o. male with hx of RA on chronic steroids for several years, hx of HTN, COPD, posterior lumbar fusion, post op wound infection with pseudomonas, s/p debridement, on Cefepime via PICC line, presented to Dr Lucianne Lei Damn's office, found to have fever and hypotension, and sent to the ER for further work up and Tx.  He had suggested BC, increase Cefepime to max allowable dose, give steroid and IVF (among others), and hospitalist was asked to admit him.  Work up included an unremarkable CBC, normal renal fx tests, and electrolytes.  His CXR showed no infiltrate.  Of importance was that he responded with prompt SBP elevation to 110 -120 with steroid IV given and one liter of IVF.     Rewiew of Systems:  Constitutional: No significant weight loss or weight gain Eyes: Negative for eye pain, redness and discharge, diplopia, visual changes, or flashes of light. ENMT: Negative for ear pain, hoarseness, nasal congestion, sinus pressure and sore throat. No headaches; tinnitus, drooling, or problem swallowing. Cardiovascular: Negative for chest pain, palpitations, diaphoresis, dyspnea and peripheral edema. ; No orthopnea, PND Respiratory: Negative for cough, hemoptysis, wheezing and stridor. No pleuritic chestpain. Gastrointestinal: Negative for nausea, vomiting, diarrhea, constipation, abdominal pain, melena, blood in stool, hematemesis, jaundice and rectal bleeding.    Genitourinary: Negative for frequency, dysuria, incontinence,flank pain and hematuria; Musculoskeletal: Negative for neck pain. Negative for swelling and trauma.;  Skin: . Negative for pruritus, rash, abrasions, bruising and skin lesion.; ulcerations Neuro: Negative for headache, lightheadedness and neck stiffness.  Negative for weakness, altered level of consciousness , altered mental status, extremity weakness, burning feet, involuntary movement, seizure and syncope.  Psych: negative for anxiety, depression, insomnia, tearfulness, panic attacks, hallucinations, paranoia, suicidal or homicidal ideation    Past Medical History  Diagnosis Date  . Hx of colonic polyps   . Diverticulosis   . Hypertension   . Osteoarthritis   . 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 CAD (coronary artery disease)     minimal coronary plaque in the LAD and right coronary system. 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 7-06. Last nuclear stress 11-17-06 fixed anterior/ inferior defect, no inducible ischemia, EF 81%  . Allergic rhinitis   . Hx of colonoscopy   . RA (rheumatoid arthritis)   . COPD (chronic obstructive pulmonary disease)   . PONV (postoperative nausea and vomiting)   . Dysrhythmia 01-24-12    past hx. A.Fib x1 episode-responded to med.  . Depression   . Anxiety     pt. feels its related to the medicine that he takes   . GERD (gastroesophageal reflux disease)     Past Surgical History  Procedure Laterality Date  . Appendectomy    . Inguinal hernia repair  2008    Right, left remotely  . Total knee arthroplasty      right knee partial 2002  . Tonsillectomy    . Shoulder preplacement  2008    left, partial  . Ptca w/ coated stent lad      Cx secondary to tear  . Foot surgery  11-08    for removal of bone  spurs- right foot  . Reconstruction of ankle  6-09    Right- [facility]  . L4-5 diskectomy w/ fusion, cage placement and rods  12-10    [place]  . Partial knee arthroplasty      left  . Total shoulder replacement      right  . Joint replacement    . Cataract extraction, bilateral  01-24-12    Bilateral  . Orif shoulder fracture  02/06/2012    Procedure: OPEN REDUCTION INTERNAL FIXATION  (ORIF) SHOULDER FRACTURE;  Surgeon: Nita Sells, MD;  Location: [facility];  Service: Orthopedics;  Laterality: Left;  ORIF of a Left Shoulder Fracture with  Iliac Crest Bone Graft aspiration   . Harvest bone graft  02/06/2012    Procedure: HARVEST ILIAC BONE GRAFT;  Surgeon: Nita Sells, MD;  Location: [facility];  Service: Orthopedics;;  bone marrow aspirqation   . Hardware removal  03/09/2012    Procedure: HARDWARE REMOVAL;  Surgeon: Nita Sells, MD;  Location: [place];  Service: Orthopedics;  Laterality: Left;  Hardware Removal from Left Shoulder  . Cardiac catheterization  2006    coronary stents- no problems  . Shoulder arthroscopy  2013  . Carpal tunnel release      R hand  . Ankle surgery Right 2008    at [facility], replacement   . Dg toes*r*      toe surgery- 07/2012  . Posterior lumbar fusion 4 level N/A 03/02/2013    Procedure: Lumbar One to Sacral One Posterior lumbar interbody fusion;  Surgeon: Floyce Stakes, MD;  Location: [place] [facility];  Service: Neurosurgery;  Laterality: N/A;  L1 to S1 Posterior lumbar interbody fusion  . Lumbar wound debridement N/A 03/17/2013    Procedure: Incision and drainage of superficial lumbar wound;  Surgeon: Floyce Stakes, MD;  Location: [place] [facility];  Service: Neurosurgery;  Laterality: N/A;  Incision and drainage of superficial lumbar wound    Medications:  HOME MEDS: Prior to Admission medications   Medication Sig Start Date End Date Taking? Authorizing Provider  Aclidinium Bromide (TUDORZA PRESSAIR) 400 MCG/ACT AEPB Inhale 1 puff into the lungs 2 (two) times daily. 01/23/12  Yes Neena Rhymes, MD  adalimumab (HUMIRA) 40 MG/0.8ML injection Inject 40 mg into the skin every 14 (fourteen) days.   Yes Historical Provider, MD  ALPRAZolam Duanne Moron) 0.5 MG tablet Take 1 tablet (0.5 mg total) by mouth 3 (three) times daily as needed for anxiety or sleep. 03/11/13  Yes Tiffany L Reed, DO  aspirin (ASPIRIN  EC) 81 MG EC tablet Take 81 mg by mouth every morning.    Yes Historical Provider, MD  celecoxib (CELEBREX) 200 MG capsule Take 200 mg by mouth 2 (two) times daily.   Yes Historical Provider, MD  cholecalciferol (VITAMIN D) 1000 UNITS tablet Take 1 tablet (1,000 Units total) by mouth daily. 06/24/12 06/24/13 Yes Evie Lacks Plotnikov, MD  colchicine 0.6 MG tablet Take 0.6 mg by mouth 2 (two) times daily.   Yes Historical Provider, MD  diazepam (VALIUM) 5 MG tablet Take 1 tablet (5 mg total) by mouth every 6 (six) hours as needed for muscle spasms. 03/26/13  Yes Neena Rhymes, MD  docusate sodium (COLACE) 100 MG capsule Take 100 mg by mouth daily as needed for mild constipation.   Yes Historical Provider, MD  FLUoxetine (PROZAC) 40 MG capsule Take 40 mg by mouth 2 (two) times daily.   Yes Historical Provider, MD  fluticasone Asencion Islam)  50 MCG/ACT nasal spray Place 2 sprays into both nostrils daily.   Yes Historical Provider, MD  furosemide (LASIX) 20 MG tablet Take 20 mg by mouth daily.   Yes Historical Provider, MD  gabapentin (NEURONTIN) 300 MG capsule Take 300-600 mg by mouth 4 (four) times daily. 600 mg every morning, 300 mg at noon, 600 mg at bedtime, and 300 mg at 1 AM   Yes Historical Provider, MD  hydrochlorothiazide (HYDRODIURIL) 25 MG tablet Take 25 mg by mouth daily before breakfast.    Yes Historical Provider, MD  HYDROmorphone (DILAUDID) 4 MG tablet Take 4 mg by mouth 2 (two) times daily as needed for moderate pain or severe pain.   Yes Historical Provider, MD  losartan (COZAAR) 50 MG tablet Take 50 mg by mouth daily.   Yes Historical Provider, MD  Melatonin ([employer] MELATONIN) 5 MG TABS Take 5 mg by mouth at bedtime.    Yes Historical Provider, MD  Nutritional Supplements (ENSURE COMPLETE PO) Take 0.5 Cans by mouth 2 (two) times daily.   Yes Historical Provider, MD  OxyCODONE (OXYCONTIN) 40 mg T12A 12 hr tablet Take 40 mg by mouth every 8 (eight) hours as needed (pain).   Yes Historical  Provider, MD  pantoprazole (PROTONIX) 40 MG tablet Take 40 mg by mouth 2 (two) times daily.  07/29/12  Yes Neena Rhymes, MD  Polyethyl Glycol-Propyl Glycol (SYSTANE ULTRA) 0.4-0.3 % SOLN Place 2 drops into both eyes 2 (two) times daily as needed (dry eyes).    Yes Historical Provider, MD  potassium chloride SA (K-DUR,KLOR-CON) 20 MEQ tablet Take 20 mEq by mouth daily.   Yes Historical Provider, MD  predniSONE (DELTASONE) 5 MG tablet Take 5 mg by mouth every evening.    Yes Historical Provider, MD  simvastatin (ZOCOR) 40 MG tablet Take 40 mg by mouth daily. At lunch   Yes Historical Provider, MD  traZODone (DESYREL) 100 MG tablet Take 100 mg by mouth at bedtime. Take just before bedtime   Yes Historical Provider, MD  zolpidem (AMBIEN) 5 MG tablet Take 1 tablet (5 mg total) by mouth at bedtime as needed for sleep. 03/26/13  Yes Neena Rhymes, MD     Allergies:  Allergies  Allergen Reactions  . Morphine And Related Shortness Of Breath, Nausea And Vomiting and Swelling  . Penicillins Hives, Shortness Of Breath and Rash    REACTION: hives, breathing problems  . Percocet [Oxycodone-Acetaminophen] Anxiety    Social History:   reports that he has never smoked. He has never used smokeless tobacco. He reports that he does not drink alcohol or use illicit drugs.  Family History: Family History  Problem Relation Age of Onset  . Uterine cancer Mother     survivor  . Macular degeneration Mother   . Other Mother     ankle edema  . Lung cancer Mother   . Cancer Mother     ovarian, lung  . Coronary artery disease Father   . Hypertension Father   . Prostate cancer Father   . Colon polyps Father   . Cancer Father     prostate  . Thyroid disease Sister   . Heart attack Brother   . Hyperlipidemia Brother   . Other Brother     Schizophrenic  . Diabetes Neg Hx   . Colon cancer Neg Hx   . Esophageal cancer Neg Hx   . Rectal cancer Neg Hx   . Stomach cancer Neg Hx   . Coronary artery  disease Maternal Aunt   . Heart attack Maternal Aunt   . Rheum arthritis Sister   . Hemochromatosis Sister      Physical Exam: Filed Vitals:   04/12/13 2315 04/13/13 0101 04/13/13 0147 04/13/13 0428  BP: 123/66 106/61 164/71 134/73  Pulse: 104 89 103 93  Temp:  99.6 F (37.6 C) 98.5 F (36.9 C)   TempSrc:  Oral Oral   Resp:  14 18 14   Weight:      SpO2: 96% 97% 100% 97%   Blood pressure 134/73, pulse 93, temperature 98.5 F (36.9 C), temperature source Oral, resp. rate 14, weight 62.143 kg (137 lb), SpO2 97.00%.  GEN:  Pleasant  patient lying in the stretcher in no acute distress; cooperative with exam. PSYCH:  alert and oriented x4; does not appear anxious or depressed; affect is appropriate. HEENT: Mucous membranes pink and anicteric; PERRLA; EOM intact; no cervical lymphadenopathy nor thyromegaly or carotid bruit; no JVD; There were no stridor. Neck is very supple. Breasts:: Not examined CHEST WALL: No tenderness CHEST: Normal respiration, clear to auscultation bilaterally.  HEART: Regular rate and rhythm.  There are no murmur, rub, or gallops.   BACK: No kyphosis or scoliosis; no CVA tenderness ABDOMEN: soft and non-tender; no masses, no organomegaly, normal abdominal bowel sounds; no pannus; no intertriginous candida. There is no rebound and no distention. Rectal Exam: Not done EXTREMITIES: No bone or joint deformity; age-appropriate arthropathy of the hands and knees; no edema; no ulcerations.  There is no calf tenderness. Genitalia: not examined PULSES: 2+ and symmetric SKIN: back wound infection.   CNS: Cranial nerves 2-12 grossly intact no focal lateralizing neurologic deficit.  Speech is fluent; uvula elevated with phonation, facial symmetry and tongue midline. DTR are normal bilaterally, cerebella exam is intact, barbinski is negative and strengths are equaled bilaterally.  No sensory loss.   Labs on Admission:  Basic Metabolic Panel:  Recent Labs Lab  04/12/13 1610  NA 130*  K 4.0  CL 93*  CO2 25  GLUCOSE 111*  BUN 15  CREATININE 0.83  CALCIUM 8.6   Liver Function Tests:  Recent Labs Lab 04/12/13 1610  AST 16  ALT 11  ALKPHOS 121*  BILITOT 0.4  PROT 6.5  ALBUMIN 2.9*   No results found for this basename: LIPASE, AMYLASE,  in the last 168 hours No results found for this basename: AMMONIA,  in the last 168 hours CBC:  Recent Labs Lab 04/12/13 1610  WBC 8.6  NEUTROABS 6.0  HGB 11.7*  HCT 35.8*  MCV 89.3  PLT 304   Cardiac Enzymes: No results found for this basename: CKTOTAL, CKMB, CKMBINDEX, TROPONINI,  in the last 168 hours  CBG: No results found for this basename: GLUCAP,  in the last 168 hours   Radiological Exams on Admission: Dg Chest 2 View  04/12/2013   CLINICAL DATA:  Wound infection  EXAM: CHEST  2 VIEW  COMPARISON:  March 22, 2013  FINDINGS: There is stable elevation of the right hemidiaphragm. There is patchy atelectasis in the right base. Lungs elsewhere are clear. Heart size and pulmonary vascularity are normal. No adenopathy.  Central catheter tip is at the cavoatrial junction. No pneumothorax. 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 There is postoperative change in the lumbar spine.  IMPRESSION: Stable elevation of the right hemidiaphragm. Persistent right base atelectasis. No new opacity seen.   Electronically Signed   By: Lowella Grip M.D.   On: 04/12/2013 23:04   Assessment/Plan Present on  Admission:  . Sepsis associated hypotension . Rheumatoid arthritis(714.0) . Adrenal insufficiency . HYPERTENSION . Dehydration  PLAN: Will admit him for possible sepsis, or adrenal insufficiency with infection, or both.  PICC line was discontinued and tip ordered to be sent for culture.  His BCs were obtained, and pharmacy has been consulted for maximum dosing of Cefepime.  Cortisol level was ordered, and will increase Prednisone to 25m per day.  I reminded him about adrenal insufficiency,  and told his wife who was at his bedside about it as well.  He did respond very promptly to IV steroid, and will be given another 2 doses at 562m  I have given him some IVF.  He is feeling better, stable, and will be admitted to SDU for closer monitoring.  Thank you for allowing me to participate in his care.  Other plans as per orders.  Code Status: FULL COHaskel KhanMD. [facility] Pager [phone] to 7am.  04/13/2013, 4:52 AM

## 2013-04-13 NOTE — ED Notes (Signed)
Pt was placed in hospital bed for comfort.

## 2013-04-13 NOTE — Progress Notes (Signed)
Pt arrived from ED, oriented to unit call bell within reach, CMT and Elink notified.  VSS-will continue to monitor.

## 2013-04-14 ENCOUNTER — Telehealth: Payer: Self-pay | Admitting: Pulmonary Disease

## 2013-04-14 ENCOUNTER — Ambulatory Visit: Payer: Medicare Other | Admitting: Internal Medicine

## 2013-04-14 LAB — URINE CULTURE
Colony Count: NO GROWTH
Culture: NO GROWTH

## 2013-04-14 NOTE — Progress Notes (Signed)
[facility]  Patient Status: Active (receiving services up to time of hospitalization)  [facility] is providing the following services: RN, PT and Home Infusion Services (teaching and education will be done by nurse in the home with patient and caregiver)  If patient discharges after hours, please call [phone].   Janae Sauce 04/14/2013, 9:39 AM

## 2013-04-14 NOTE — Progress Notes (Signed)
[facility] PROGRESS NOTE  JEB SCHLOEMER OFB:[id number] DOB: [birth year] DOA: 04/12/2013 PCP: Adella Hare, MD  Assessment/Plan: .  Hypotension -Unclear etiology, meds versus infection versus adrenal insufficiency -Adrenal insufficiency less likely as serum cortisols so far normal x2(noted that patient has been on chronic low-dose prednisone and blood pressures on admission responded well to steroids) -Status post PICC line removal -Catheter tip and blood cultures so far with no growth to date-continue cefepime -I have consulted infectious disease Dr. Baxter Flattery to see. -Improved off antihypertensives, steroids and IV fluids, continue antibiotics  . Rheumatoid arthritis(714.0) -Continue prednisone  . HYPERTENSION -Continue Holding antihypertensives . Dehydration hyponatremia -resume hydration with normal saline and follow . Chronic steroid use -Continue prednisone at increased dose for now follow and further taper as appropriate . Status post multilevel lumbar fusion with probable wound infection -Status post I&D on 12/11 -Have consulted neurosurgery patient's per pt's request  for followup. -Follow and reinsert PICC line when okayed per ID . Recent Pseudomonas urinary tract infection-continue cefepime Code Status: Full Family Communication: None at bedside Disposition Plan: To home when medically ready   Consultants:  ID-eval pending  Neurosurgery-eval pending  Procedures:  None  Antibiotics:  Cefepime to be continued through 1/23  HPI/Subjective: States he feels much better this a.m.-reports no back pain so far. Denies chest pain and no dizziness  Objective: Filed Vitals:   04/14/13 1121  BP: 117/90  Pulse: 86  Temp:   Resp: 26    Intake/Output Summary (Last 24 hours) at 04/14/13 1326 Last data filed at 04/14/13 0400  Gross per 24 hour  Intake      0 ml  Output    700 ml  Net   -700 ml   Filed Weights   04/12/13 1603  Weight: 62.143 kg (137 lb)     Exam:  General: alert & oriented x 3 In NAD Cardiovascular: RRR, nl S1 s2 Respiratory: CTAB Abdomen: soft +BS NT/ND, no masses palpable Extremities: No cyanosis and no edema    Data Reviewed: Basic Metabolic Panel:  Recent Labs Lab 04/12/13 1610  NA 130*  K 4.0  CL 93*  CO2 25  GLUCOSE 111*  BUN 15  CREATININE 0.83  CALCIUM 8.6   Liver Function Tests:  Recent Labs Lab 04/12/13 1610  AST 16  ALT 11  ALKPHOS 121*  BILITOT 0.4  PROT 6.5  ALBUMIN 2.9*   No results found for this basename: LIPASE, AMYLASE,  in the last 168 hours No results found for this basename: AMMONIA,  in the last 168 hours CBC:  Recent Labs Lab 04/12/13 1610  WBC 8.6  NEUTROABS 6.0  HGB 11.7*  HCT 35.8*  MCV 89.3  PLT 304   Cardiac Enzymes: No results found for this basename: CKTOTAL, CKMB, CKMBINDEX, TROPONINI,  in the last 168 hours BNP (last 3 results) No results found for this basename: PROBNP,  in the last 8760 hours CBG: No results found for this basename: GLUCAP,  in the last 168 hours  Recent Results (from the past 240 hour(s))  CULTURE, BLOOD (ROUTINE X 2)     Status: None   Collection Time    04/12/13  9:40 PM      Result Value Range Status   Specimen Description BLOOD LEFT ARM   Final   Special Requests BOTTLES DRAWN AEROBIC AND ANAEROBIC 10CC EA   Final   Culture  Setup Time     Final   Value: 04/13/2013 03:58     Performed  at [facility]   Culture     Final   Value:        BLOOD CULTURE RECEIVED NO GROWTH TO DATE CULTURE WILL BE HELD FOR 5 DAYS BEFORE ISSUING A FINAL NEGATIVE REPORT     Performed at [facility]   Report Status PENDING   Incomplete  CULTURE, BLOOD (ROUTINE X 2)     Status: None   Collection Time    04/12/13  9:45 PM      Result Value Range Status   Specimen Description BLOOD LEFT HAND   Final   Special Requests BOTTLES DRAWN AEROBIC ONLY 5CC   Final   Culture  Setup Time     Final   Value: 04/13/2013 03:58      Performed at [facility]   Culture     Final   Value:        BLOOD CULTURE RECEIVED NO GROWTH TO DATE CULTURE WILL BE HELD FOR 5 DAYS BEFORE ISSUING A FINAL NEGATIVE REPORT     Performed at [facility]   Report Status PENDING   Incomplete  URINE CULTURE     Status: None   Collection Time    04/12/13 11:35 PM      Result Value Range Status   Specimen Description URINE, CLEAN CATCH   Final   Special Requests NONE   Final   Culture  Setup Time     Final   Value: 04/13/2013 00:49     Performed at [facility] Count     Final   Value: NO GROWTH     Performed at [facility]   Culture     Final   Value: NO GROWTH     Performed at [facility]   Report Status 04/14/2013 FINAL   Final  CATH TIP CULTURE     Status: None   Collection Time    04/13/13 12:59 AM      Result Value Range Status   Specimen Description CATH TIP PICC LINE   Final   Special Requests NONE   Final   Culture     Final   Value: NO GROWTH 1 DAY     Performed at [facility]   Report Status PENDING   Incomplete  MRSA PCR SCREENING     Status: None   Collection Time    04/13/13  5:16 PM      Result Value Range Status   MRSA by PCR NEGATIVE  NEGATIVE Final   Comment:            The GeneXpert MRSA Assay (FDA     approved for NASAL specimens     only), is one component of a     comprehensive MRSA colonization     surveillance program. It is not     intended to diagnose MRSA     infection nor to guide or     monitor treatment for     MRSA infections.     Studies: Dg Chest 2 View  04/12/2013   CLINICAL DATA:  Wound infection  EXAM: CHEST  2 VIEW  COMPARISON:  March 22, 2013  FINDINGS: There is stable elevation of the right hemidiaphragm. There is patchy atelectasis in the right base. Lungs elsewhere are clear. Heart size and pulmonary vascularity are normal. No adenopathy.  Central catheter tip is at the cavoatrial junction. No pneumothorax. There are  total shoulder replacements bilaterally.  There is postoperative change in the lumbar spine.  IMPRESSION: Stable elevation of the right hemidiaphragm. Persistent right base atelectasis. No new opacity seen.   Electronically Signed   By: Lowella Grip M.D.   On: 04/12/2013 23:04    Scheduled Meds: . aspirin EC  81 mg Oral q morning - 10a  . ceFEPime (MAXIPIME) IV  2 g Intravenous Q8H  . cholecalciferol  1,000 Units Oral Daily  . colchicine  0.6 mg Oral BID  . docusate sodium  100 mg Oral BID  . feeding supplement (ENSURE COMPLETE)  237 mL Oral BID BM  . FLUoxetine  40 mg Oral BID  . fluticasone  2 spray Each Nare Daily  . gabapentin  300 mg Oral BID  . gabapentin  600 mg Oral BID  . pantoprazole  40 mg Oral BID  . predniSONE  40 mg Oral QAC breakfast  . simvastatin  40 mg Oral Daily  . sodium chloride  3 mL Intravenous Q12H  . tiotropium  18 mcg Inhalation Daily  . traZODone  100 mg Oral QHS   Continuous Infusions:   Principal Problem:   Sepsis associated hypotension Active Problems:   HYPERTENSION   Rheumatoid arthritis(714.0)   Dehydration   Wound infection after surgery   Adrenal insufficiency   Chronic use of steroids    Time spent: [facility] Pager [phone]. If 7PM-7AM, please contact night-coverage at [email or web address], password [facility] 04/14/2013, 1:26 PM  LOS: 2 days

## 2013-04-14 NOTE — Progress Notes (Signed)
INITIAL NUTRITION ASSESSMENT  DOCUMENTATION CODES Per approved criteria  -Severe malnutrition in the context of acute illness or injury   INTERVENTION:  Continue Ensure Complete po BID, each supplement provides 350 kcal and 13 grams of protein RD to follow for nutrition care plan  NUTRITION DIAGNOSIS: Increased nutrient needs related to malnutrition, repletion as evidenced by estimated nutrition needs  Goal: Pt to meet >/= 90% of their estimated nutrition needs   Monitor:  PO & supplemental intake, weight, labs, I/O's  Reason for Assessment: Malnutrition Screening Tool Report  68 y.o. male  Admitting Dx: Sepsis associated hypotension  ASSESSMENT: Patient with PMH of HTN, COPD, posterior lumbar fusion, post op wound infection with pseudomonas, s/p debridement; found to have fever and hypotension; sent to the ER for further work up.  Patient known to this RD from previous hospital admission for revision of his lumbar wound; patient with hx of poor PO intake; he states today he ate well at lunch in quite some time; also reports weight loss; per readings, patient has had a 13% weight loss x 1 month; receiving Ensure Complete supplements   Patient meets criteria for severe malnutrition in the context of acute illness or injury as evidenced by < 50% intake of estimated energy requirement for > 5 days and 13% weight loss x 1 month.  Height: Ht Readings from Last 1 Encounters:  04/13/13 5' 6"  (1.676 m)    Weight: Wt Readings from Last 1 Encounters:  04/12/13 137 lb (62.143 kg)    Ideal Body Weight: 142 lb  % Ideal Body Weight: 96%  Wt Readings from Last 10 Encounters:  04/12/13 137 lb (62.143 kg)  04/12/13 138 lb 8 oz (62.823 kg)  03/16/13 158 lb 4.6 oz (71.8 kg)  03/16/13 158 lb 4.6 oz (71.8 kg)  03/02/13 158 lb 4.6 oz (71.8 kg)  03/02/13 158 lb 4.6 oz (71.8 kg)  02/23/13 150 lb (68.04 kg)  01/18/13 150 lb (68.04 kg)  12/15/12 151 lb (68.493 kg)  10/05/12 156 lb 6.4  oz (70.943 kg)    Usual Body Weight: 158 lb  % Usual Body Weight: 86%  BMI:  Body mass index is 22.12 kg/(m^2).  Estimated Nutritional Needs: Kcal: 1850-2050 Protein: 95-105 gm Fluid: 1.8-2.0 L  Skin: Intact  Diet Order: General  EDUCATION NEEDS: -No education needs identified at this time   Intake/Output Summary (Last 24 hours) at 04/14/13 1543 Last data filed at 04/14/13 1514  Gross per 24 hour  Intake      0 ml  Output   1125 ml  Net  -1125 ml    Labs:   Recent Labs Lab 04/12/13 1610  NA 130*  K 4.0  CL 93*  CO2 25  BUN 15  CREATININE 0.83  CALCIUM 8.6  GLUCOSE 111*    Scheduled Meds: . aspirin EC  81 mg Oral q morning - 10a  . ceFEPime (MAXIPIME) IV  2 g Intravenous Q8H  . cholecalciferol  1,000 Units Oral Daily  . colchicine  0.6 mg Oral BID  . docusate sodium  100 mg Oral BID  . feeding supplement (ENSURE COMPLETE)  237 mL Oral BID BM  . FLUoxetine  40 mg Oral BID  . fluticasone  2 spray Each Nare Daily  . gabapentin  300 mg Oral BID  . gabapentin  600 mg Oral BID  . pantoprazole  40 mg Oral BID  . predniSONE  40 mg Oral QAC breakfast  . simvastatin  40 mg Oral  Daily  . sodium chloride  3 mL Intravenous Q12H  . tiotropium  18 mcg Inhalation Daily  . traZODone  100 mg Oral QHS    Continuous Infusions:   Past Medical History  Diagnosis Date  . Hx of colonic polyps   . Diverticulosis   . Hypertension   . Osteoarthritis   . 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 CAD (coronary artery disease)     minimal coronary plaque in the LAD and right coronary system. 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 7-06. Last nuclear stress 11-17-06 fixed anterior/ inferior defect, no inducible ischemia, EF 81%  . Allergic rhinitis   . Hx of colonoscopy   . RA (rheumatoid arthritis)   . COPD (chronic obstructive pulmonary disease)   . PONV  (postoperative nausea and vomiting)   . Dysrhythmia 01-24-12    past hx. A.Fib x1 episode-responded to med.  . Depression   . Anxiety     pt. feels its related to the medicine that he takes   . GERD (gastroesophageal reflux disease)     Past Surgical History  Procedure Laterality Date  . Appendectomy    . Inguinal hernia repair  2008    Right, left remotely  . Total knee arthroplasty      right knee partial 2002  . Tonsillectomy    . Shoulder preplacement  2008    left, partial  . Ptca w/ coated stent lad      Cx secondary to tear  . Foot surgery  11-08    for removal of bone spurs- right foot  . Reconstruction of ankle  6-09    Right- [facility]  . L4-5 diskectomy w/ fusion, cage placement and rods  12-10    [place]  . Partial knee arthroplasty      left  . Total shoulder replacement      right  . Joint replacement    . Cataract extraction, bilateral  01-24-12    Bilateral  . Orif shoulder fracture  02/06/2012    Procedure: OPEN REDUCTION INTERNAL FIXATION (ORIF) SHOULDER FRACTURE;  Surgeon: Nita Sells, MD;  Location: [facility];  Service: Orthopedics;  Laterality: Left;  ORIF of a Left Shoulder Fracture with  Iliac Crest Bone Graft aspiration   . Harvest bone graft  02/06/2012    Procedure: HARVEST ILIAC BONE GRAFT;  Surgeon: Nita Sells, MD;  Location: [facility];  Service: Orthopedics;;  bone marrow aspirqation   . Hardware removal  03/09/2012    Procedure: HARDWARE REMOVAL;  Surgeon: Nita Sells, MD;  Location: [facility];  Service: Orthopedics;  Laterality: Left;  Hardware Removal from Left Shoulder  . Cardiac catheterization  2006    coronary stents- no problems  . Shoulder arthroscopy  2013  . Carpal tunnel release      R hand  . Ankle surgery Right 2008    at [facility], replacement   . Dg toes*r*      toe surgery- 07/2012  . Posterior lumbar fusion 4 level N/A 03/02/2013    Procedure: Lumbar One to Sacral One Posterior lumbar  interbody fusion;  Surgeon: Floyce Stakes, MD;  Location: [place];  Service: Neurosurgery;  Laterality: N/A;  L1 to S1 Posterior lumbar interbody fusion  . Lumbar wound debridement N/A 03/17/2013    Procedure: Incision and drainage of superficial lumbar  wound;  Surgeon: Floyce Stakes, MD;  Location: [facility];  Service: Neurosurgery;  Laterality: N/A;  Incision and drainage of superficial lumbar wound    Arthur Holms, RD, LDN Pager #: [phone] After-Hours Pager #: [phone]

## 2013-04-14 NOTE — Care Management Note (Unsigned)
    Page 1 of 1   04/22/2013     3:45:35 PM   CARE MANAGEMENT NOTE 04/22/2013  Patient:  [name], [name]   Account Number:  [id number]  Date Initiated:  04/14/2013  Documentation initiated by:  Marvetta Gibbons  Subjective/Objective Assessment:   Pt admitted with sepsis/infection     Action/Plan:   PTA pt lived at home with spouse- PTA had HH- RN/PT and iv abx with [facility] - will need resumption orders at discharge for [facility]- NCM to follow   Anticipated DC Date:  04/25/2013   Anticipated DC Plan:  [place]  CM consult      Choice offered to / List presented to:             Status of service:  In process, will continue to follow Medicare Important Message given?   (If response is "NO", the following Medicare IM given date fields will be blank) Date Medicare IM given:   Date Additional Medicare IM given:    Discharge Disposition:    Per UR Regulation:  Reviewed for med. necessity/level of care/duration of stay  If discussed at [place] of Stay Meetings, dates discussed:   04/20/2013  04/22/2013    Comments:  04/22/13 Zayden Hahne,RN,BSN [phone] PT'S HOSP STAY COMPLICATED BY [facility].  ADVANCING DIET.  PT PROGRESSING WELL.

## 2013-04-14 NOTE — Telephone Encounter (Signed)
Whose pt is this? Wert?

## 2013-04-14 NOTE — Progress Notes (Signed)
[facility] for Infectious Disease    Date of Admission:  04/12/2013   Total days of antibiotics 28        Day 28 cefepime           ID: [name] is a 68 y.o. male with  hx of hemochromatosis, HTN COPD, RA and lumbar pain.  on 03-02-13 and underwent L1-S1 decompression and fusion. C/b post op wound infection s/p debridement on 12-10 and treated with cefepime x 6 wks for P aeruginosa (pan-sens) on OR culture. He was seen in follow up in ID clinic on 1/5 and found to be hypotensive. Admitted for further evaluation. He was pan cultured. picc line removed. But no further source of infection is found.  He was continued on cefepime.    Principal Problem:   Sepsis associated hypotension Active Problems:   HYPERTENSION   Rheumatoid arthritis(714.0)   Dehydration   Wound infection after surgery   Adrenal insufficiency   Chronic use of steroids    Subjective: He is now rested after staying in ED > 24hr. He reports 40 lb weight loss from hospitalizations since November. He also reports that he has sustained loss of voice since having surgery. 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  Medications:  . aspirin EC  81 mg Oral q morning - 10a  . ceFEPime (MAXIPIME) IV  2 g Intravenous Q8H  . cholecalciferol  1,000 Units Oral Daily  . colchicine  0.6 mg Oral BID  . docusate sodium  100 mg Oral BID  . feeding supplement (ENSURE COMPLETE)  237 mL Oral BID BM  . FLUoxetine  40 mg Oral BID  . fluticasone  2 spray Each Nare Daily  . gabapentin  300 mg Oral BID  . gabapentin  600 mg Oral BID  . pantoprazole  40 mg Oral BID  . predniSONE  40 mg Oral QAC breakfast  . simvastatin  40 mg Oral Daily  . sodium chloride  3 mL Intravenous Q12H  . tiotropium  18 mcg Inhalation Daily  . traZODone  100 mg Oral QHS    Objective: Vital signs in last 24 hours: Temp:  [97.9 F (36.6 C)-98.8 F (37.1 C)] 98.4 F (36.9 C)  (01/07 1112) Pulse Rate:  [77-95] 86 (01/07 1121) Resp:  [16-26] 26 (01/07 1121) BP: (99-178)/(52-91) 117/90 mmHg (01/07 1121) SpO2:  [94 %-100 %] 95 % (01/07 1121)  Physical Exam  Constitutional: He is oriented to person, place, and time. He appears well-developed and well-nourished. No distress.  HENT:  Mouth/Throat: Oropharynx is clear and moist. No oropharyngeal exudate.  Cardiovascular: Normal rate, regular rhythm and normal heart sounds. Exam reveals no gallop and no friction rub.  No murmur heard.  Pulmonary/Chest: Effort normal and breath sounds normal. No respiratory distress. He has no wheezes.  Abdominal: Soft. Bowel sounds are normal. He exhibits no distension. There is no tenderness.  Lymphadenopathy:  He has no cervical adenopathy.  Neurological: He is alert and oriented to person, place, and time.  Skin: Skin is warm and dry. No rash noted. No erythema. Lumbar wound has good granulation tissue Psychiatric: He has a normal mood and affect. His behavior is normal.    Lab Results  Recent Labs  04/12/13 1610  WBC 8.6  HGB 11.7*  HCT 35.8*  NA 130*  K 4.0  CL 93*  CO2 25  BUN 15  CREATININE 0.83  Liver Panel  Recent Labs  04/12/13 1610  PROT 6.5  ALBUMIN 2.9*  AST 16  ALT 11  ALKPHOS 121*  BILITOT 0.4   Sedimentation Rate No results found for this basename: ESRSEDRATE,  in the last 72 hours C-Reactive Protein No results found for this basename: CRP,  in the last 72 hours  Microbiology: 1/5 blood cx ngtd 1/5 urine cx ngtd 1/6 cath tip ngtd Studies/Results: Dg Chest 2 View  04/12/2013   CLINICAL DATA:  Wound infection  EXAM: CHEST  2 VIEW  COMPARISON:  March 22, 2013  FINDINGS: There is stable elevation of the right hemidiaphragm. There is patchy atelectasis in the right base. Lungs elsewhere are clear. Heart size and pulmonary vascularity are normal. No adenopathy.  Central catheter tip is at the cavoatrial junction. No pneumothorax. There are  total shoulder replacements bilaterally. There is postoperative change in the lumbar spine.  IMPRESSION: Stable elevation of the right hemidiaphragm. Persistent right base atelectasis. No new opacity seen.   Electronically Signed   By: Lowella Grip M.D.   On: 04/12/2013 23:04     Assessment/Plan: Pseudomonas post surgical wound infection = continue with cefepime until jan 24th to complete 6 wk course of therapy. Can place picc line tomorrow if cultures remain negative.  Hypotension = thought to be med related, poor po intake. Adrenal insufficiency ruled out. No other source of infection noted. Will follow culture  Pharyngitis = slightly erythema on exam but patient reports raspy, scratchy voice x 3-4 wks often can only whisper. ? Vocal cord injury. Consider getting ENT evaluation while hospitalized  [name], [facility] for Infectious Diseases Cell: [phone] Pager: [phone]  04/14/2013, 1:04 PM

## 2013-04-14 NOTE — Telephone Encounter (Signed)
Called and spoke with pts wife and she stated that the pt has been on long term tx with antibiotics.  She stated that in the last 3 wks the pt has lost his voice. pts wife wanted to know if this could be related to the abx use.  RA please advise. Thanks  Allergies  Allergen Reactions  . Morphine And Related Shortness Of Breath, Nausea And Vomiting and Swelling  . Penicillins Hives, Shortness Of Breath and Rash    REACTION: hives, breathing problems  . Percocet [Oxycodone-Acetaminophen] Anxiety     Current Facility-Administered Medications on File Prior to Visit  Medication Dose Route Frequency Provider Last Rate Last Dose  . ALPRAZolam Duanne Moron) tablet 0.5 mg  0.5 mg Oral TID PRN Orvan Falconer, MD      . aspirin EC tablet 81 mg  81 mg Oral q morning - 10a Orvan Falconer, MD   81 mg at 04/14/13 1059  . ceFEPIme (MAXIPIME) 2 g in dextrose 5 % 50 mL IVPB  2 g Intravenous Q8H Rogue Bussing, RPH   2 g at 04/14/13 4174  . cholecalciferol (VITAMIN D) tablet 1,000 Units  1,000 Units Oral Daily Orvan Falconer, MD   1,000 Units at 04/14/13 1059  . colchicine tablet 0.6 mg  0.6 mg Oral BID Orvan Falconer, MD   0.6 mg at 04/14/13 1059  . diazepam (VALIUM) tablet 5 mg  5 mg Oral Q6H PRN Orvan Falconer, MD   5 mg at 04/14/13 0732  . docusate sodium (COLACE) capsule 100 mg  100 mg Oral Daily PRN Orvan Falconer, MD      . docusate sodium (COLACE) capsule 100 mg  100 mg Oral BID Orvan Falconer, MD   100 mg at 04/14/13 1059  . feeding supplement (ENSURE COMPLETE) (ENSURE COMPLETE) liquid 237 mL  237 mL Oral BID BM Orvan Falconer, MD      . FLUoxetine (PROZAC) capsule 40 mg  40 mg Oral BID Orvan Falconer, MD   40 mg at 04/14/13 1059  . fluticasone (FLONASE) 50 MCG/ACT nasal spray 2 spray  2 spray Each Nare Daily Orvan Falconer, MD   2 spray at 04/14/13 1059  . gabapentin (NEURONTIN) capsule 300 mg  300 mg Oral BID Orvan Falconer, MD   300 mg at 04/14/13 1105  . gabapentin (NEURONTIN) tablet 600 mg  600 mg Oral BID Orvan Falconer, MD   600 mg at 04/14/13 0814  .  HYDROmorphone (DILAUDID) injection 1 mg  1 mg Intravenous Q2H PRN Orvan Falconer, MD   1 mg at 04/14/13 1150  . loratadine (CLARITIN) tablet 10 mg  10 mg Oral Daily Hennie Duos, MD      . ondansetron [facility]) tablet 4 mg  4 mg Oral Q6H PRN Orvan Falconer, MD       Or  . ondansetron [facility]) injection 4 mg  4 mg Intravenous Q6H PRN Orvan Falconer, MD      . pantoprazole (PROTONIX) EC tablet 40 mg  40 mg Oral BID Orvan Falconer, MD   40 mg at 04/14/13 1059  . polyvinyl alcohol (LIQUIFILM TEARS) 1.4 % ophthalmic solution 2 drop  2 drop Both Eyes BID PRN Orvan Falconer, MD      . predniSONE (DELTASONE) tablet 40 mg  40 mg Oral QAC breakfast Orvan Falconer, MD   40 mg at 04/14/13 0906  . simvastatin (ZOCOR) tablet 40 mg  40 mg Oral Daily Orvan Falconer, MD   40 mg at 04/14/13 1059  . sodium  chloride 0.9 % injection 3 mL  3 mL Intravenous Q12H Orvan Falconer, MD   3 mL at 04/14/13 1059  . tiotropium (SPIRIVA) inhalation capsule 18 mcg  18 mcg Inhalation Daily Orvan Falconer, MD   18 mcg at 04/14/13 0800  . traZODone (DESYREL) tablet 100 mg  100 mg Oral QHS Orvan Falconer, MD   100 mg at 04/13/13 2216  . zolpidem (AMBIEN) tablet 5 mg  5 mg Oral QHS PRN Orvan Falconer, MD       Current Outpatient Prescriptions on File Prior to Visit  Medication Sig Dispense Refill  . Aclidinium Bromide (TUDORZA PRESSAIR) 400 MCG/ACT AEPB Inhale 1 puff into the lungs 2 (two) times daily.  1 each  11  . adalimumab (HUMIRA) 40 MG/0.8ML injection Inject 40 mg into the skin every 14 (fourteen) days.      . ALPRAZolam (XANAX) 0.5 MG tablet Take 1 tablet (0.5 mg total) by mouth 3 (three) times daily as needed for anxiety or sleep.  90 tablet  5  . aspirin (ASPIRIN EC) 81 MG EC tablet Take 81 mg by mouth every morning.       . celecoxib (CELEBREX) 200 MG capsule Take 200 mg by mouth 2 (two) times daily.      . cholecalciferol (VITAMIN D) 1000 UNITS tablet Take 1 tablet (1,000 Units total) by mouth daily.  100 tablet  3  . colchicine 0.6 MG tablet Take 0.6 mg by mouth 2 (two) times daily.       . diazepam (VALIUM) 5 MG tablet Take 1 tablet (5 mg total) by mouth every 6 (six) hours as needed for muscle spasms.  30 tablet  0  . docusate sodium (COLACE) 100 MG capsule Take 100 mg by mouth daily as needed for mild constipation.      [name] FLUoxetine (PROZAC) 40 MG capsule Take 40 mg by mouth 2 (two) times daily.      . fluticasone (FLONASE) 50 MCG/ACT nasal spray Place 2 sprays into both nostrils daily.      . furosemide (LASIX) 20 MG tablet Take 20 mg by mouth daily.      [name] gabapentin (NEURONTIN) 300 MG capsule Take 300-600 mg by mouth 4 (four) times daily. 600 mg every morning, 300 mg at noon, 600 mg at bedtime, and 300 mg at 1 AM      . hydrochlorothiazide (HYDRODIURIL) 25 MG tablet Take 25 mg by mouth daily before breakfast.       . HYDROmorphone (DILAUDID) 4 MG tablet Take 4 mg by mouth 2 (two) times daily as needed for moderate pain or severe pain.      [name] losartan (COZAAR) 50 MG tablet Take 50 mg by mouth daily.      . Melatonin ([employer] MELATONIN) 5 MG TABS Take 5 mg by mouth at bedtime.       . Nutritional Supplements (ENSURE COMPLETE PO) Take 0.5 Cans by mouth 2 (two) times daily.      . OxyCODONE (OXYCONTIN) 40 mg T12A 12 hr tablet Take 40 mg by mouth every 8 (eight) hours as needed (pain).      . pantoprazole (PROTONIX) 40 MG tablet Take 40 mg by mouth 2 (two) times daily.       Vladimir Faster Glycol-Propyl Glycol (SYSTANE ULTRA) 0.4-0.3 % SOLN Place 2 drops into both eyes 2 (two) times daily as needed (dry eyes).       . potassium chloride SA (K-DUR,KLOR-CON) 20 MEQ tablet Take 20 mEq by mouth  daily.      . predniSONE (DELTASONE) 5 MG tablet Take 5 mg by mouth every evening.       . simvastatin (ZOCOR) 40 MG tablet Take 40 mg by mouth daily. At lunch      . traZODone (DESYREL) 100 MG tablet Take 100 mg by mouth at bedtime. Take just before bedtime      . zolpidem (AMBIEN) 5 MG tablet Take 1 tablet (5 mg total) by mouth at bedtime as needed for sleep.  30 tablet  0

## 2013-04-14 NOTE — Progress Notes (Signed)
Came to see mr [name]. Spoke with him and his wife as well as the RN taking care of him. Clinically he is stable. The dressing was changed few hours ago so i did not see it. So far cultures are negative. Mentally he is confused. i will f/u him and take a look at the wound in am

## 2013-04-15 ENCOUNTER — Ambulatory Visit: Payer: Medicare Other | Admitting: Internal Medicine

## 2013-04-15 DIAGNOSIS — IMO0002 Reserved for concepts with insufficient information to code with codable children: Secondary | ICD-10-CM

## 2013-04-15 DIAGNOSIS — D649 Anemia, unspecified: Secondary | ICD-10-CM

## 2013-04-15 DIAGNOSIS — E43 Unspecified severe protein-calorie malnutrition: Secondary | ICD-10-CM | POA: Diagnosis present

## 2013-04-15 LAB — CBC
HCT: 30.1 % — ABNORMAL LOW (ref 39.0–52.0)
Hemoglobin: 10.1 g/dL — ABNORMAL LOW (ref 13.0–17.0)
MCH: 29.5 pg (ref 26.0–34.0)
MCHC: 33.6 g/dL (ref 30.0–36.0)
MCV: 88 fL (ref 78.0–100.0)
Platelets: 336 10*3/uL (ref 150–400)
RBC: 3.42 MIL/uL — ABNORMAL LOW (ref 4.22–5.81)
RDW: 14.5 % (ref 11.5–15.5)
WBC: 6.4 10*3/uL (ref 4.0–10.5)

## 2013-04-15 LAB — BASIC METABOLIC PANEL
BUN: 14 mg/dL (ref 6–23)
CO2: 28 mEq/L (ref 19–32)
Calcium: 8.3 mg/dL — ABNORMAL LOW (ref 8.4–10.5)
Chloride: 100 mEq/L (ref 96–112)
Creatinine, Ser: 0.63 mg/dL (ref 0.50–1.35)
GFR calc Af Amer: >90 mL/min (ref >90)
GFR calc non Af Amer: >90 mL/min (ref >90)
Glucose, Bld: 120 mg/dL — ABNORMAL HIGH (ref 70–99)
Potassium: 3.8 mEq/L (ref 3.7–5.3)
Sodium: 137 mEq/L (ref 137–147)

## 2013-04-15 LAB — CATH TIP CULTURE: Culture: NO GROWTH

## 2013-04-15 MED ORDER — FLEET ENEMA 7-19 GM/118ML RE ENEM
1.0000 | ENEMA | Freq: Every day | RECTAL | Status: DC | PRN
Start: 2013-04-15 — End: 2013-04-24
  Administered 2013-04-15: 1 via RECTAL
  Filled 2013-04-15 (×2): qty 1

## 2013-04-15 MED ORDER — HYDROMORPHONE HCL 2 MG PO TABS
4.0000 mg | ORAL_TABLET | Freq: Four times a day (QID) | ORAL | Status: DC | PRN
  Administered 2013-04-15 – 2013-04-24 (×16): 4 mg via ORAL
  Filled 2013-04-15 (×17): qty 2

## 2013-04-15 MED ORDER — PREDNISONE 20 MG PO TABS
20.0000 mg | ORAL_TABLET | Freq: Every day | ORAL | Status: DC
  Administered 2013-04-16 – 2013-04-18 (×3): 20 mg via ORAL
  Filled 2013-04-15 (×4): qty 1

## 2013-04-15 MED ORDER — OXYMETAZOLINE HCL 0.05 % NA SOLN: 1.0000 | Freq: Two times a day (BID) | NASAL | Status: AC | PRN

## 2013-04-15 MED ORDER — LOSARTAN POTASSIUM 25 MG PO TABS
25.0000 mg | ORAL_TABLET | Freq: Every day | ORAL | Status: DC
  Administered 2013-04-15 – 2013-04-22 (×7): 25 mg via ORAL
  Filled 2013-04-15 (×9): qty 1

## 2013-04-15 MED ORDER — SODIUM CHLORIDE 0.9 % IJ SOLN
10.0000 mL | INTRAMUSCULAR | Status: DC | PRN
  Administered 2013-04-15 – 2013-04-24 (×8): 10 mL

## 2013-04-15 NOTE — Clinical Documentation Improvement (Signed)
Possible Clinical Conditions?       Severe Malnutrition       Other________________   Supporting Information:  Nutrition consult on 04/14/13, noted "Severe malnutrition in the context of acute illness or injury."  "Patient meets criteria for severe malnutrition in the context of acute illness or injury as evidenced by < 50% intake of estimated energy requirement for > 5 days and 13% weight loss x 1 month."  Treatment:  Continue Ensure Complete po BID, each supplement provides 350 kcal and 13 grams of protein. RD to follow for nutrition care plan.   Thank You,  Posey Pronto, RN, BSN, [place] Documentation Improvement Specialist HIM department--[place] Office [phone]

## 2013-04-15 NOTE — Progress Notes (Signed)
Patient ID: [name], male   DOB: 1945/12/16, 68 y.o.   MRN: [id number] Stable, less pain. More alert.spoke with him and wife

## 2013-04-15 NOTE — Telephone Encounter (Signed)
Please advise Dr. Melvyn Novas thanks

## 2013-04-15 NOTE — Telephone Encounter (Signed)
lmomtcb x1 for spouse

## 2013-04-15 NOTE — Progress Notes (Signed)
Patient being transferred to 2west bed 7 via wheelchair. Phone report has been called to [facility]. Patient and SO aware of the transfer.

## 2013-04-15 NOTE — Consult Note (Signed)
[name], [name] [id number] 01-Apr-1946 Sheila Oats, MD  Reason for Consult: hoarseness  HPI: patient with recent back surgery and possible bacteremia who has been hoarse for several days to weeks. ENt consulted for laryngoscopy.  Allergies:  Allergies  Allergen Reactions  . Morphine And Related Shortness Of Breath, Nausea And Vomiting and Swelling  . Penicillins Hives, Shortness Of Breath and Rash    REACTION: hives, breathing problems  . Percocet [Oxycodone-Acetaminophen] Anxiety    ROS: hoarseness, fevers, fatigue, back pain, otherwise negative x 12 systems except per HPI PMH:  Past Medical History  Diagnosis Date  . Hx of colonic polyps   . Diverticulosis   . Hypertension   . Osteoarthritis   . 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 CAD (coronary artery disease)     minimal coronary plaque in the LAD and right coronary system. 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 7-06. Last nuclear stress 11-17-06 fixed anterior/ inferior defect, no inducible ischemia, EF 81%  . Allergic rhinitis   . Hx of colonoscopy   . RA (rheumatoid arthritis)   . COPD (chronic obstructive pulmonary disease)   . PONV (postoperative nausea and vomiting)   . Dysrhythmia 01-24-12    past hx. A.Fib x1 episode-responded to med.  . Depression   . Anxiety     pt. feels its related to the medicine that he takes   . GERD (gastroesophageal reflux disease)     FH:  Family History  Problem Relation Age of Onset  . Uterine cancer Mother     survivor  . Macular degeneration Mother   . Other Mother     ankle edema  . Lung cancer Mother   . Cancer Mother     ovarian, lung  . Coronary artery disease Father   . Hypertension Father   . Prostate cancer Father   . Colon polyps Father   . Cancer Father     prostate  . Thyroid disease Sister   . Heart attack Brother   . Hyperlipidemia  Brother   . Other Brother     Schizophrenic  . Diabetes Neg Hx   . Colon cancer Neg Hx   . Esophageal cancer Neg Hx   . Rectal cancer Neg Hx   . Stomach cancer Neg Hx   . Coronary artery disease Maternal Aunt   . Heart attack Maternal Aunt   . Rheum arthritis Sister   . Hemochromatosis Sister     SH:  History   Social History  . Marital Status: Divorced    Spouse Name: N/A    Number of Children: 2  . Years of Education: 16   Occupational History  . 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 Smoking status: Never Smoker   . Smokeless tobacco: Never Used  . Alcohol Use: No  . Drug Use: No  . Sexual Activity: Yes    Partners: Female   Other Topics Concern  . Not on file   Social History Narrative   [employer] [place]. Married 1972-35yr seperated/divorced. Engaged April '11. 1 son '74; step-daughter '70. 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 During '11 discovered an [employer]- $500,000+ loss. Is handling this OK - will keep the  business running.    PSH:  Past Surgical History  Procedure Laterality Date  . Appendectomy    . Inguinal hernia repair  2008    Right, left remotely  . Total knee arthroplasty      right knee partial 2002  . Tonsillectomy    . Shoulder preplacement  2008    left, partial  . Ptca w/ coated stent lad      Cx secondary to tear  . Foot surgery  11-08    for removal of bone spurs- right foot  . Reconstruction of ankle  6-09    Right- [facility]  . L4-5 diskectomy w/ fusion, cage placement and rods  12-10    [facility]  . Partial knee arthroplasty      left  . Total shoulder replacement      right  . Joint replacement    . Cataract extraction, bilateral  01-24-12    Bilateral  . Orif shoulder fracture  02/06/2012    Procedure: OPEN REDUCTION INTERNAL FIXATION (ORIF) SHOULDER FRACTURE;  Surgeon:  Nita Sells, MD;  Location: [facility];  Service: Orthopedics;  Laterality: Left;  ORIF of a Left Shoulder Fracture with  Iliac Crest Bone Graft aspiration   . Harvest bone graft  02/06/2012    Procedure: HARVEST ILIAC BONE GRAFT;  Surgeon: Nita Sells, MD;  Location: [facility];  Service: Orthopedics;;  bone marrow aspirqation   . Hardware removal  03/09/2012    Procedure: HARDWARE REMOVAL;  Surgeon: Nita Sells, MD;  Location: [facility];  Service: Orthopedics;  Laterality: Left;  Hardware Removal from Left Shoulder  . Cardiac catheterization  2006    coronary stents- no problems  . Shoulder arthroscopy  2013  . Carpal tunnel release      R hand  . Ankle surgery Right 2008    at [facility], replacement   . Dg toes*r*      toe surgery- 07/2012  . Posterior lumbar fusion 4 level N/A 03/02/2013    Procedure: Lumbar One to Sacral One Posterior lumbar interbody fusion;  Surgeon: Floyce Stakes, MD;  Location: [facility];  Service: Neurosurgery;  Laterality: N/A;  L1 to S1 Posterior lumbar interbody fusion  . Lumbar wound debridement N/A 03/17/2013    Procedure: Incision and drainage of superficial lumbar wound;  Surgeon: Floyce Stakes, MD;  Location: [facility];  Service: Neurosurgery;  Laterality: N/A;  Incision and drainage of superficial lumbar wound    Physical  Exam: CN 2-12 grossly intact and symmetric. Mild dysphonia when speaking. EAC/TMs normal BL. Oral cavity, lips, gums, ororpharynx normal with no masses or lesions. Skin warm and dry. Nasal cavity without polyps or purulence. External nose and ears without masses or lesions. EOMI, PERRLA. Neck supple.  Procedure Note: 31575 Informed verbal consent was obtained after explaining the risks (including bleeding and infection), benefits and alternatives of the procedure. Verbal timeout was performed prior to the procedure. The 90m flexible scope was advanced through the nasal cavity. The septum  and turbinates appeared normal. The middle meatus was free of polyps or purulence. The eustachian tube, choana, and adenoids were normal in appearance. The hypopharynx, arytenoids, false vocal folds, and true vocal folds appeared normal with no vocal fold masses or lesions. The true vocal folds adduct and abduct normally bilaterally. The visualized portion of the subglottis appeared normal. The patient tolerated the procedure with no immediate complications.  A/P: hoarseness with normal scope/laryngoscopic exam. The hoarseness is  likely functional. Can consult speech to see if they can work with him on vocal technique, etc to improve his voice. ENT will sign off.   Ruby Cola 04/15/2013 6:19 PM

## 2013-04-15 NOTE — Telephone Encounter (Signed)
i have not seen in over 2 years so can't comment on his care but would be happy to see in office prn - he should check with his primary first

## 2013-04-15 NOTE — Progress Notes (Signed)
[facility] PROGRESS NOTE  [name] KGU:[id number] DOB: April 19, 1945 DOA: 04/12/2013 PCP: Adella Hare, MD  Assessment/Plan: .  Hypotension -Unclear etiology, meds versus infection versus adrenal insufficiency -Adrenal insufficiency less likely as serum cortisols so far normal x2(noted that patient has been on chronic low-dose prednisone and blood pressures on admission responded well to steroids) -Status post PICC line removal -Catheter tip and blood cultures so far with no growth to date-continue cefepime -I have consulted infectious disease Dr. Baxter Flattery to see. -Resolved off antihypertensives, steroids and IV fluids, continue antibiotics  -BP elevated this a.m. Will begin tapering prednisone and resume Cozaar at decreased dose -Cultures remained with no growth to date, will order the replacement as recommended per ID.  [name] Rheumatoid arthritis(714.0) -Continue prednisone as above.  [name] HYPERTENSION -Continue Holding antihypertensives . Dehydration hyponatremia -resume hydration with normal saline and follow . Chronic steroid use -Begin tapering prednisone-he usually is on maintenance dose.  . Status post multilevel lumbar fusion with probable wound infection -Status post I&D on 12/11 -Have consulted neurosurgery patient's per pt's request  for followup. -Cultures still with no growth today will order PICC replacement -Continue pain management . Recent Pseudomonas urinary tract infection-continue cefepime Code Status: Full Family Communication: None at bedside Disposition Plan: To home when medically ready   Consultants:  ID-eval pending  Neurosurgery-eval pending  Procedures:  None  Antibiotics:  Cefepime to be continued through 1/23  HPI/Subjective: Was up early and ambulated-laps round the unit. C/o of back pain today  Objective: Filed Vitals:   04/15/13 0800  BP: 150/91  Pulse:   Temp: 97.9 F (36.6 C)  Resp:     Intake/Output Summary (Last 24  hours) at 04/15/13 1131 Last data filed at 04/15/13 0537  Gross per 24 hour  Intake    580 ml  Output   1475 ml  Net   -895 ml   Filed Weights   04/12/13 1603  Weight: 62.143 kg (137 lb)    Exam:  General: alert & oriented x 3 In NAD Cardiovascular: RRR, nl S1 s2 Respiratory: CTAB Abdomen: soft +BS NT/ND, no masses palpable Extremities: No cyanosis and no edema    Data Reviewed: Basic Metabolic Panel:  Recent Labs Lab 04/12/13 1610 04/15/13 0248  NA 130* 137  K 4.0 3.8  CL 93* 100  CO2 25 28  GLUCOSE 111* 120*  BUN 15 14  CREATININE 0.83 0.63  CALCIUM 8.6 8.3*   Liver Function Tests:  Recent Labs Lab 04/12/13 1610  AST 16  ALT 11  ALKPHOS 121*  BILITOT 0.4  PROT 6.5  ALBUMIN 2.9*   No results found for this basename: LIPASE, AMYLASE,  in the last 168 hours No results found for this basename: AMMONIA,  in the last 168 hours CBC:  Recent Labs Lab 04/12/13 1610 04/15/13 0248  WBC 8.6 6.4  NEUTROABS 6.0  --   HGB 11.7* 10.1*  HCT 35.8* 30.1*  MCV 89.3 88.0  PLT 304 336   Cardiac Enzymes: No results found for this basename: CKTOTAL, CKMB, CKMBINDEX, TROPONINI,  in the last 168 hours BNP (last 3 results) No results found for this basename: PROBNP,  in the last 8760 hours CBG: No results found for this basename: GLUCAP,  in the last 168 hours  Recent Results (from the past 240 hour(s))  CULTURE, BLOOD (ROUTINE X 2)     Status: None   Collection Time    04/12/13  9:40 PM      Result Value  Range Status   Specimen Description BLOOD LEFT ARM   Final   Special Requests BOTTLES DRAWN AEROBIC AND ANAEROBIC 10CC EA   Final   Culture  Setup Time     Final   Value: 04/13/2013 03:58     Performed at [facility]   Culture     Final   Value:        BLOOD CULTURE RECEIVED NO GROWTH TO DATE CULTURE WILL BE HELD FOR 5 DAYS BEFORE ISSUING A FINAL NEGATIVE REPORT     Performed at [facility]   Report Status PENDING   Incomplete  CULTURE,  BLOOD (ROUTINE X 2)     Status: None   Collection Time    04/12/13  9:45 PM      Result Value Range Status   Specimen Description BLOOD LEFT HAND   Final   Special Requests BOTTLES DRAWN AEROBIC ONLY 5CC   Final   Culture  Setup Time     Final   Value: 04/13/2013 03:58     Performed at [facility]   Culture     Final   Value:        BLOOD CULTURE RECEIVED NO GROWTH TO DATE CULTURE WILL BE HELD FOR 5 DAYS BEFORE ISSUING A FINAL NEGATIVE REPORT     Performed at [facility]   Report Status PENDING   Incomplete  URINE CULTURE     Status: None   Collection Time    04/12/13 11:35 PM      Result Value Range Status   Specimen Description URINE, CLEAN CATCH   Final   Special Requests NONE   Final   Culture  Setup Time     Final   Value: 04/13/2013 00:49     Performed at [facility]     Final   Value: NO GROWTH     Performed at [facility]   Culture     Final   Value: NO GROWTH     Performed at [facility]   Report Status 04/14/2013 FINAL   Final  CATH TIP CULTURE     Status: None   Collection Time    04/13/13 12:59 AM      Result Value Range Status   Specimen Description CATH TIP PICC LINE   Final   Special Requests NONE   Final   Culture     Final   Value: NO GROWTH 2 DAYS     Performed at [facility]   Report Status 04/15/2013 FINAL   Final  MRSA PCR SCREENING     Status: None   Collection Time    04/13/13  5:16 PM      Result Value Range Status   MRSA by PCR NEGATIVE  NEGATIVE Final   Comment:            The GeneXpert MRSA Assay (FDA     approved for NASAL specimens     only), is one component of a     comprehensive MRSA colonization     surveillance program. It is not     intended to diagnose MRSA     infection nor to guide or     monitor treatment for     MRSA infections.     Studies: No results found.  Scheduled Meds: . aspirin EC  81 mg Oral q morning - 10a  . ceFEPime (MAXIPIME) IV  2 g  Intravenous Q8H  . cholecalciferol  1,000 Units Oral Daily  . colchicine  0.6 mg Oral BID  . docusate sodium  100 mg Oral BID  . feeding supplement (ENSURE COMPLETE)  237 mL Oral BID BM  . FLUoxetine  40 mg Oral BID  . fluticasone  2 spray Each Nare Daily  . gabapentin  300 mg Oral BID  . gabapentin  600 mg Oral BID  . pantoprazole  40 mg Oral BID  . simvastatin  40 mg Oral Daily  . sodium chloride  3 mL Intravenous Q12H  . tiotropium  18 mcg Inhalation Daily  . traZODone  100 mg Oral QHS   Continuous Infusions:   Principal Problem:   Sepsis associated hypotension Active Problems:   HYPERTENSION   Rheumatoid arthritis(714.0)   Dehydration   Wound infection after surgery   Adrenal insufficiency   Chronic use of steroids   Protein-calorie malnutrition, severe    Time spent: [facility] Pager [phone]. If 7PM-7AM, please contact night-coverage at [email or web address], password [facility] 04/15/2013, 11:31 AM  LOS: 3 days

## 2013-04-15 NOTE — Progress Notes (Signed)
While changing back wound observed below wound small piece of white suture sticking out of the skin and below that there is a hole the size of a nail head that isn't draining. SO present and observed dressing change. SO also took a picture of the wound.

## 2013-04-15 NOTE — Progress Notes (Signed)
Patient in bathroom, found him to be impacted as well as severe constipation. Required to call MD for fleets enema. Patient expelled large amount of form soft stool and also took the enema and passed a lot of gas.

## 2013-04-15 NOTE — Progress Notes (Signed)
Peripherally Inserted Central Catheter/Midline Placement  The IV Nurse has discussed with the patient and/or persons authorized to consent for the patient, the purpose of this procedure and the potential benefits and risks involved with this procedure.  The benefits include less needle sticks, lab draws from the catheter and patient may be discharged home with the catheter.  Risks include, but not limited to, infection, bleeding, blood clot (thrombus formation), and puncture of an artery; nerve damage and irregular heat beat.  Alternatives to this procedure were also discussed.  PICC/Midline Placement Documentation        [name] 04/15/2013, 12:08 PM

## 2013-04-16 DIAGNOSIS — Z5189 Encounter for other specified aftercare: Secondary | ICD-10-CM

## 2013-04-16 DIAGNOSIS — R490 Dysphonia: Secondary | ICD-10-CM

## 2013-04-16 NOTE — Evaluation (Signed)
Speech Language Pathology Voice Evaluation Patient Details Name: [name] MRN: [id number] DOB: 28-Jul-1945 Today's Date: 04/16/2013 Time: 4034-7425 SLP Time Calculation (min): 28 min  Problem List:  Patient Active Problem List   Diagnosis Date Noted  . Protein-calorie malnutrition, severe 04/15/2013  . Sepsis associated hypotension 04/13/2013  . Wound infection after surgery 04/13/2013  . Adrenal insufficiency 04/13/2013  . Chronic use of steroids 04/13/2013  . Hypokalemia 03/18/2013  . Dehydration 03/17/2013  . Hyponatremia 03/17/2013  . GERD (gastroesophageal reflux disease) 03/17/2013  . Infection, undetermined etiology 03/16/2013  . Lumbar degenerative disc disease 03/02/2013  . Lumbar herniated disc 03/02/2013  . Laxity of abdominal wall-right side 09/03/2012  . Vertigo 06/24/2012  . Acute confusional state 06/24/2012  . Ataxia 06/22/2012  . Anemia 06/22/2012  . PVC (premature ventricular contraction) 06/22/2012  . Tremor 06/22/2012  . Chronic pain 04/08/2012  . Routine health maintenance 04/08/2012  . Fracture of acromion of scapula 02/07/2012  . Painless hematuria 08/09/2011  . Other abnormal glucose 08/07/2011  . Obstructive chronic bronchitis with exacerbation 07/23/2011  . 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 Sinusitis, chronic 11/08/2010  . Cough 10/25/2010  . Atrial fibrillation 08/07/2010  . VENOUS INSUFFICIENCY, LEGS 04/10/2009  . BACK PAIN, LUMBAR, WITH RADICULOPATHY 04/06/2009  . DEGENERATIVE DISC DISEASE, LUMBOSACRAL SPINE 10/26/2008  . Rheumatoid arthritis(714.0) 10/18/2008  . ANXIETY 11/09/2007  . PAIN IN SOFT TISSUES OF LIMB 04/17/2007  . DYSLIPIDEMIA 03/30/2007  . PSEUDOGOUT 03/30/2007  . HYPERTENSION 03/30/2007  . CAD 03/30/2007  . ALLERGIC RHINITIS 03/30/2007  . GENERALIZED OSTEOARTHROSIS UNSPECIFIED SITE 03/30/2007  . HEMOCHROMATOSIS, HX OF 03/30/2007  . COLONIC POLYPS, HX OF 03/30/2007  . DIVERTICULITIS, HX OF 03/30/2007   Past  Medical History:  Past Medical History  Diagnosis Date  . Hx of colonic polyps   . Diverticulosis   . Hypertension   . Osteoarthritis   . 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 CAD (coronary artery disease)     minimal coronary plaque in the LAD and right coronary system. 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 7-06. Last nuclear stress 11-17-06 fixed anterior/ inferior defect, no inducible ischemia, EF 81%  . Allergic rhinitis   . Hx of colonoscopy   . RA (rheumatoid arthritis)   . COPD (chronic obstructive pulmonary disease)   . PONV (postoperative nausea and vomiting)   . Dysrhythmia 01-24-12    past hx. A.Fib x1 episode-responded to med.  . Depression   . Anxiety     pt. feels its related to the medicine that he takes   . GERD (gastroesophageal reflux disease)    Past Surgical History:  Past Surgical History  Procedure Laterality Date  . Appendectomy    . Inguinal hernia repair  2008    Right, left remotely  . Total knee arthroplasty      right knee partial 2002  . Tonsillectomy    . Shoulder preplacement  2008    left, partial  . Ptca w/ coated stent lad      Cx secondary to tear  . Foot surgery  11-08    for removal of bone spurs- right foot  . Reconstruction of ankle  6-09    Right- [facility]  . L4-5 diskectomy w/ fusion, cage placement and rods  12-10    [facility]  . Partial knee arthroplasty      left  .  Total shoulder replacement      right  . Joint replacement    . Cataract extraction, bilateral  01-24-12    Bilateral  . Orif shoulder fracture  02/06/2012    Procedure: OPEN REDUCTION INTERNAL FIXATION (ORIF) SHOULDER FRACTURE;  Surgeon: Mable Paris, MD;  Location: [facility];  Service: Orthopedics;  Laterality: Left;  ORIF of a Left Shoulder Fracture with  Iliac Crest Bone Graft aspiration   . Harvest bone graft  02/06/2012    Procedure:  HARVEST ILIAC BONE GRAFT;  Surgeon: Mable Paris, MD;  Location: [facility];  Service: Orthopedics;;  bone marrow aspirqation   . Hardware removal  03/09/2012    Procedure: HARDWARE REMOVAL;  Surgeon: Mable Paris, MD;  Location: [facility];  Service: Orthopedics;  Laterality: Left;  Hardware Removal from Left Shoulder  . Cardiac catheterization  2006    coronary stents- no problems  . Shoulder arthroscopy  2013  . Carpal tunnel release      R hand  . Ankle surgery Right 2008    at [facility], replacement   . Dg toes*r*      toe surgery- 07/2012  . Posterior lumbar fusion 4 level N/A 03/02/2013    Procedure: Lumbar One to Sacral One Posterior lumbar interbody fusion;  Surgeon: Karn Cassis, MD;  Location: [facility];  Service: Neurosurgery;  Laterality: N/A;  L1 to S1 Posterior lumbar interbody fusion  . Lumbar wound debridement N/A 03/17/2013    Procedure: Incision and drainage of superficial lumbar wound;  Surgeon: Karn Cassis, MD;  Location: [facility];  Service: Neurosurgery;  Laterality: N/A;  Incision and drainage of superficial lumbar wound   HPI:  [name] is a 68 y.o. male with  hx of hemochromatosis, HTN COPD, RA and lumbar pain.  on 03-02-13 and underwent L1-S1 decompression and fusion. C/b post op wound infection s/p debridement on 12-10 and treated with cefepime x 6 wks for P aeruginosa (pan-sens) on OR culture. He was seen in follow up in ID clinic on 1/5 and found to be hypotensive; dx sepsis associated hypotension  Pt has had hoarse voice for several weeks post-surgery; evaluated by ENT 04/16/13: "Informed verbal consent was obtained after explaining the risks (including bleeding and infection), benefits and alternatives of the procedure. Verbal timeout was performed prior to the procedure. The 4mm flexible scope was advanced through the nasal cavity. The septum and turbinates appeared normal. The middle meatus was free of polyps or  purulence. The eustachian tube, choana, and adenoids were normal in appearance. The hypopharynx, arytenoids, false vocal folds, and true vocal folds appeared normal with no vocal fold masses or lesions. The true vocal folds adduct and abduct normally bilaterally. The visualized portion of the subglottis appeared normal. The patient tolerated the procedure with no immediate complications."  MD recommended SLP f/u.   Assessment / Plan / Recommendation Clinical Impression  Referral received for voice evaluation.  Pt evaluated by ENT 04/15/13, with dx of functional voice disorder and recs for SLP treatment.  Evaluation today revealed hoarse, low volume phonation.  Pt unable to modulate pitch.  Sustained /a/ for limited time of six seconds; sustained /s/ for 13 seconds but unable to achieve /z/ in order to acquire s/z ratio.  Pt with difficulty modulating expiration during phonation.  Requires frequent inhalation during speech in order to verbalize sentences greater than ten words in length.  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  We discussed options for voice therapy as an outpatient, and pt is going to pursue f/u after discharge.      SLP Assessment  All further Speech Lanaguage Pathology  needs can be addressed in the next venue of care    Follow Up Recommendations  Outpatient SLP       SLP Evaluation             Oral / Motor/voice Motor Speech Phonation: Low vocal intensity (hoarse; see clinical impression) Resonance: Within functional limits Articulation: Within functional limitis Intelligibility: Intelligible Motor Planning: Witnin functional limits Motor Speech Errors: Not applicable       [name], [place] CCC/SLP Pager [phone]  [name] 04/16/2013, 2:35 PM

## 2013-04-16 NOTE — Progress Notes (Signed)
Stable. Mentally more alert. Decrease of lumbar pain.

## 2013-04-16 NOTE — Progress Notes (Signed)
[facility]    Date of Admission:  04/12/2013   Total days of antibiotics 28        Day 28 cefepime           ID: FELIS QUILLIN is a 68 y.o. male with  hx of hemochromatosis, HTN COPD, RA and lumbar pain.  on 03-02-13 and underwent L1-S1 decompression and fusion. C/b post op wound infection s/p debridement on 12-10 and treated with cefepime x 6 wks for P aeruginosa (pan-sens) on OR culture. He was seen in follow up in ID clinic on 1/5 and found to be hypotensive. Admitted for further evaluation. He was pan cultured. picc line removed. But no further source of infection is found.  He was continued on cefepime.    Principal Problem:   Sepsis associated hypotension Active Problems:   HYPERTENSION   Rheumatoid arthritis(714.0)   Dehydration   Wound infection after surgery   Adrenal insufficiency   Chronic use of steroids   Protein-calorie malnutrition, severe    Subjective: He remains afebrile. Evaluated by ENT yesterday. Feeling better. Had new picc line placed yesterday  Medications:  . aspirin EC  81 mg Oral q morning - 10a  . ceFEPime (MAXIPIME) IV  2 g Intravenous Q8H  . cholecalciferol  1,000 Units Oral Daily  . colchicine  0.6 mg Oral BID  . docusate sodium  100 mg Oral BID  . feeding supplement (ENSURE COMPLETE)  237 mL Oral BID BM  . FLUoxetine  40 mg Oral BID  . fluticasone  2 spray Each Nare Daily  . gabapentin  300 mg Oral BID  . gabapentin  600 mg Oral BID  . losartan  25 mg Oral Daily  . pantoprazole  40 mg Oral BID  . predniSONE  20 mg Oral Q breakfast  . simvastatin  40 mg Oral Daily  . sodium chloride  3 mL Intravenous Q12H  . tiotropium  18 mcg Inhalation Daily  . traZODone  100 mg Oral QHS    Objective: Vital signs in last 24 hours: Temp:  [97.2 F (36.2 C)-98.2 F (36.8 C)] 98.2 F (36.8 C) (01/08 2146) Pulse Rate:  [75-89] 75 (01/08 2146) Resp:  [18] 18 (01/08 2146) BP: (135-138)/(83-86) 138/86 mmHg (01/08  2146) SpO2:  [97 %-98 %] 98 % (01/08 2146)  Physical Exam  Constitutional: He is oriented to person, place, and time. He appears well-developed and well-nourished. No distress.  HENT:  Mouth/Throat: Oropharynx is clear and moist. No oropharyngeal exudate.  Cardiovascular: Normal rate, regular rhythm and normal heart sounds. Exam reveals no gallop and no friction rub.  No murmur heard.  Pulmonary/Chest: Effort normal and breath sounds normal. No respiratory distress. He has no wheezes.  Abdominal: Soft. Bowel sounds are normal. He exhibits no distension. There is no tenderness.  Lymphadenopathy:  He has no cervical adenopathy.  Neurological: He is alert and oriented to person, place, and time.  Skin: Skin is warm and dry. No rash noted. No erythema. Lumbar wound has good granulation tissue Psychiatric: He has a normal mood and affect. His behavior is normal.    Lab Results  Recent Labs  04/15/13 0248  WBC 6.4  HGB 10.1*  HCT 30.1*  NA 137  K 3.8  CL 100  CO2 28  BUN 14  CREATININE 0.63    Microbiology: 1/5 blood cx ngtd 1/5 urine cx ngtd 1/6 cath tip ngtd Studies/Results: No results found.   Assessment/Plan: Pseudomonas post  surgical wound infection = continue with cefepime until jan 24th to complete 6 wk course of therapy.   Hypotension = thought to be med related, poor po intake. Adrenal insufficiency ruled out. No other source of infection noted. No positive blood cultures to suggest secondary bacterial infection  hoarseness= evaluated by ENT thought to be functional issue. Recommended rest of voice adn vocal technique via speech consult  Will sign off and have pt follow up with Lucianne Lei dam on jan 24th or sooner.  Baxter Flattery [facility] Cell: [phone] Pager: [phone]  04/16/2013, 1:50 PM

## 2013-04-16 NOTE — Progress Notes (Signed)
[facility] PROGRESS NOTE  MICAHEL OMLOR IOX:[id number] DOB: 11-03-1945 DOA: 04/12/2013 PCP: Adella Hare, MD  Assessment/Plan: .  Hypotension -Unclear etiology, meds versus infection versus adrenal insufficiency -Adrenal insufficiency less likely as serum cortisols so far normal x2(noted that patient has been on chronic low-dose prednisone and blood pressures on admission responded well to steroids) -Status post PICC line removal -Catheter tip and blood cultures so far with no growth to date-continue cefepime -Appreciate ID input, patient to continue cefepime until January 24 -Resolved off antihypertensives, steroids and IV fluids, continue antibiotics  -BP elevated this a.m. Will begin tapering prednisone and resume Cozaar at decreased dose -Cultures remained with no growth to date, will order the replacement as recommended per ID.  [name] Rheumatoid arthritis(714.0) -Continue prednisone as above.  [name] HYPERTENSION -Continue Cozaar at current dose, blood pressure stable the  . Dehydration hyponatremia -resume hydration with normal saline and follow . Chronic steroid use - tapering prednisone-he usually is on maintenance dose.   . Status post multilevel lumbar fusion with probable wound infection -Status post I&D on 12/11 -Appreciate neurosurgery input -Cultures still with no growth, status post PICC replacement -Continue pain management . Recent Pseudomonas urinary tract infection-continue cefepime . Hoarseness -I consulted ENT on 1/8, appreciate Dr.Gore's input>> no vocal cord injury -Speech therapy consulted as recommended  Code Status: Full Family Communication: None at bedside Disposition Plan: plan DC in a.m. if continues to do well   Consultants:  ID-eval pending  Neurosurgery-eval pending  Procedures:  None  Antibiotics:  Cefepime to be continued through 1/23  HPI/Subjective: Up with walker washing his face, report some pain but controlled with  meds  Objective: Filed Vitals:   04/16/13 1345  BP: 154/91  Pulse: 77  Temp: 97.9 F (36.6 C)  Resp: 18    Intake/Output Summary (Last 24 hours) at 04/16/13 1541 Last data filed at 04/16/13 1300  Gross per 24 hour  Intake   1080 ml  Output    950 ml  Net    130 ml   Filed Weights   04/12/13 1603  Weight: 62.143 kg (137 lb)    Exam:  General: alert & oriented x 3 In NAD Cardiovascular: RRR, nl S1 s2 Respiratory: CTAB Abdomen: soft +BS NT/ND, no masses palpable Extremities: No cyanosis and no edema    Data Reviewed: Basic Metabolic Panel:  Recent Labs Lab 04/12/13 1610 04/15/13 0248  NA 130* 137  K 4.0 3.8  CL 93* 100  CO2 25 28  GLUCOSE 111* 120*  BUN 15 14  CREATININE 0.83 0.63  CALCIUM 8.6 8.3*   Liver Function Tests:  Recent Labs Lab 04/12/13 1610  AST 16  ALT 11  ALKPHOS 121*  BILITOT 0.4  PROT 6.5  ALBUMIN 2.9*   No results found for this basename: LIPASE, AMYLASE,  in the last 168 hours No results found for this basename: AMMONIA,  in the last 168 hours CBC:  Recent Labs Lab 04/12/13 1610 04/15/13 0248  WBC 8.6 6.4  NEUTROABS 6.0  --   HGB 11.7* 10.1*  HCT 35.8* 30.1*  MCV 89.3 88.0  PLT 304 336   Cardiac Enzymes: No results found for this basename: CKTOTAL, CKMB, CKMBINDEX, TROPONINI,  in the last 168 hours BNP (last 3 results) No results found for this basename: PROBNP,  in the last 8760 hours CBG: No results found for this basename: GLUCAP,  in the last 168 hours  Recent Results (from the past 240 hour(s))  CULTURE, BLOOD (ROUTINE  X 2)     Status: None   Collection Time    04/12/13  9:40 PM      Result Value Range Status   Specimen Description BLOOD LEFT ARM   Final   Special Requests BOTTLES DRAWN AEROBIC AND ANAEROBIC 10CC EA   Final   Culture  Setup Time     Final   Value: 04/13/2013 03:58     Performed at [facility]   Culture     Final   Value:        BLOOD CULTURE RECEIVED NO GROWTH TO DATE CULTURE  WILL BE HELD FOR 5 DAYS BEFORE ISSUING A FINAL NEGATIVE REPORT     Performed at [facility]   Report Status PENDING   Incomplete  CULTURE, BLOOD (ROUTINE X 2)     Status: None   Collection Time    04/12/13  9:45 PM      Result Value Range Status   Specimen Description BLOOD LEFT HAND   Final   Special Requests BOTTLES DRAWN AEROBIC ONLY 5CC   Final   Culture  Setup Time     Final   Value: 04/13/2013 03:58     Performed at [facility]   Culture     Final   Value:        BLOOD CULTURE RECEIVED NO GROWTH TO DATE CULTURE WILL BE HELD FOR 5 DAYS BEFORE ISSUING A FINAL NEGATIVE REPORT     Performed at [facility]   Report Status PENDING   Incomplete  URINE CULTURE     Status: None   Collection Time    04/12/13 11:35 PM      Result Value Range Status   Specimen Description URINE, CLEAN CATCH   Final   Special Requests NONE   Final   Culture  Setup Time     Final   Value: 04/13/2013 00:49     Performed at [facility]     Final   Value: NO GROWTH     Performed at [facility]   Culture     Final   Value: NO GROWTH     Performed at [facility]   Report Status 04/14/2013 FINAL   Final  CATH TIP CULTURE     Status: None   Collection Time    04/13/13 12:59 AM      Result Value Range Status   Specimen Description CATH TIP PICC LINE   Final   Special Requests NONE   Final   Culture     Final   Value: NO GROWTH 2 DAYS     Performed at [facility]   Report Status 04/15/2013 FINAL   Final  MRSA PCR SCREENING     Status: None   Collection Time    04/13/13  5:16 PM      Result Value Range Status   MRSA by PCR NEGATIVE  NEGATIVE Final   Comment:            The GeneXpert MRSA Assay (FDA     approved for NASAL specimens     only), is one component of a     comprehensive MRSA colonization     surveillance program. It is not     intended to diagnose MRSA     infection nor to guide or     monitor treatment for      MRSA infections.  Studies: No results found.  Scheduled Meds: . aspirin EC  81 mg Oral q morning - 10a  . ceFEPime (MAXIPIME) IV  2 g Intravenous Q8H  . cholecalciferol  1,000 Units Oral Daily  . colchicine  0.6 mg Oral BID  . docusate sodium  100 mg Oral BID  . feeding supplement (ENSURE COMPLETE)  237 mL Oral BID BM  . FLUoxetine  40 mg Oral BID  . fluticasone  2 spray Each Nare Daily  . gabapentin  300 mg Oral BID  . gabapentin  600 mg Oral BID  . losartan  25 mg Oral Daily  . pantoprazole  40 mg Oral BID  . predniSONE  20 mg Oral Q breakfast  . simvastatin  40 mg Oral Daily  . sodium chloride  3 mL Intravenous Q12H  . tiotropium  18 mcg Inhalation Daily  . traZODone  100 mg Oral QHS   Continuous Infusions:   Principal Problem:   Sepsis associated hypotension Active Problems:   HYPERTENSION   Rheumatoid arthritis(714.0)   Dehydration   Wound infection after surgery   Adrenal insufficiency   Chronic use of steroids   Protein-calorie malnutrition, severe    Time spent: [facility] Pager [phone]. If 7PM-7AM, please contact night-coverage at [email or web address], password [facility] 04/16/2013, 3:41 PM  LOS: 4 days

## 2013-04-16 NOTE — Telephone Encounter (Signed)
Pt spouse is aware. Eh Sesay, CMA  

## 2013-04-16 NOTE — Progress Notes (Signed)
ANTIBIOTIC CONSULT NOTE - FOLLOW UP  Pharmacy Consult for cefepime Indication: pseudomonas post-op lumbar wound infection  Allergies  Allergen Reactions  . Morphine And Related Shortness Of Breath, Nausea And Vomiting and Swelling  . Penicillins Hives, Shortness Of Breath and Rash    REACTION: hives, breathing problems  . Percocet [Oxycodone-Acetaminophen] Anxiety    Patient Measurements: Height: 5' 6"  (167.6 cm) Weight: 137 lb (62.143 kg) IBW/kg (Calculated) : 63.8   Vital Signs: Temp: 97.9 F (36.6 C) (01/09 1345) Temp src: Oral (01/09 1345) BP: 154/91 mmHg (01/09 1345) Pulse Rate: 77 (01/09 1345) Intake/Output from previous day: 01/08 0701 - 01/09 0700 In: 490 [P.O.:480; I.V.:10] Out: 300 [Urine:300] Intake/Output from this shift: Total I/O In: 600 [P.O.:600] Out: 650 [Urine:650]  Labs:  Recent Labs  04/15/13 0248  WBC 6.4  HGB 10.1*  PLT 336  CREATININE 0.63   Estimated Creatinine Clearance: 80.9 ml/min (by C-G formula based on Cr of 0.63). No results found for this basename: VANCOTROUGH, VANCOPEAK, VANCORANDOM, [place], [place], [place], [place], TOBRAPEAK, TOBRARND, AMIKACINPEAK, AMIKACINTROU, AMIKACIN,  in the last 72 hours   Microbiology: Recent Results (from the past 720 hour(s))  WOUND CULTURE     Status: None   Collection Time    03/17/13  9:56 PM      Result Value Range Status   Specimen Description WOUND BACK   Final   Special Requests SUPERFICIAL LUMBAR WOUND   Final   Gram Stain     Final   Value: FEW WBC PRESENT,BOTH PMN AND MONONUCLEAR     NO SQUAMOUS EPITHELIAL CELLS SEEN     NO ORGANISMS SEEN     Performed at [facility]     Performed at [facility]   Culture     Final   Value: FEW PSEUDOMONAS AERUGINOSA     Performed at [facility]   Report Status 03/20/2013 FINAL   Final   Organism ID, Bacteria PSEUDOMONAS AERUGINOSA   Final  ANAEROBIC CULTURE     Status: None   Collection Time    03/17/13   9:56 PM      Result Value Range Status   Specimen Description WOUND BACK   Final   Special Requests SUPERFICIAL LUMBAR WOUND   Final   Gram Stain     Final   Value: FEW WBC PRESENT,BOTH PMN AND MONONUCLEAR     NO SQUAMOUS EPITHELIAL CELLS SEEN     NO ORGANISMS SEEN     Performed at [facility]     Performed at [facility]   Culture     Final   Value: NO ANAEROBES ISOLATED     Performed at [facility]   Report Status 03/22/2013 FINAL   Final  GRAM STAIN     Status: None   Collection Time    03/17/13  9:56 PM      Result Value Range Status   Specimen Description WOUND BACK   Final   Special Requests SUPERFICIAL LUMBAR WOUND   Final   Gram Stain     Final   Value: FEW WBC PRESENT,BOTH PMN AND MONONUCLEAR     NO ORGANISMS SEEN   Report Status 03/17/2013 FINAL   Final  CULTURE, BLOOD (ROUTINE X 2)     Status: None   Collection Time    04/12/13  9:40 PM      Result Value Range Status   Specimen Description BLOOD LEFT ARM   Final   Special  Requests BOTTLES DRAWN AEROBIC AND ANAEROBIC 10CC EA   Final   Culture  Setup Time     Final   Value: 04/13/2013 03:58     Performed at [facility]   Culture     Final   Value:        BLOOD CULTURE RECEIVED NO GROWTH TO DATE CULTURE WILL BE HELD FOR 5 DAYS BEFORE ISSUING A FINAL NEGATIVE REPORT     Performed at [facility]   Report Status PENDING   Incomplete  CULTURE, BLOOD (ROUTINE X 2)     Status: None   Collection Time    04/12/13  9:45 PM      Result Value Range Status   Specimen Description BLOOD LEFT HAND   Final   Special Requests BOTTLES DRAWN AEROBIC ONLY 5CC   Final   Culture  Setup Time     Final   Value: 04/13/2013 03:58     Performed at [facility]   Culture     Final   Value:        BLOOD CULTURE RECEIVED NO GROWTH TO DATE CULTURE WILL BE HELD FOR 5 DAYS BEFORE ISSUING A FINAL NEGATIVE REPORT     Performed at [facility]   Report Status PENDING   Incomplete   URINE CULTURE     Status: None   Collection Time    04/12/13 11:35 PM      Result Value Range Status   Specimen Description URINE, CLEAN CATCH   Final   Special Requests NONE   Final   Culture  Setup Time     Final   Value: 04/13/2013 00:49     Performed at [facility]     Final   Value: NO GROWTH     Performed at [facility]   Culture     Final   Value: NO GROWTH     Performed at [facility]   Report Status 04/14/2013 FINAL   Final  CATH TIP CULTURE     Status: None   Collection Time    04/13/13 12:59 AM      Result Value Range Status   Specimen Description CATH TIP PICC LINE   Final   Special Requests NONE   Final   Culture     Final   Value: NO GROWTH 2 DAYS     Performed at [facility]   Report Status 04/15/2013 FINAL   Final  MRSA PCR SCREENING     Status: None   Collection Time    04/13/13  5:16 PM      Result Value Range Status   MRSA by PCR NEGATIVE  NEGATIVE Final   Comment:            The GeneXpert MRSA Assay (FDA     approved for NASAL specimens     only), is one component of a     comprehensive MRSA colonization     surveillance program. It is not     intended to diagnose MRSA     infection nor to guide or     monitor treatment for     MRSA infections.    Anti-infectives   Start     Dose/Rate Route Frequency Ordered Stop   04/13/13 0015  ceFEPIme (MAXIPIME) 2 g in dextrose 5 % 50 mL IVPB     2 g 100 mL/hr over 30 Minutes Intravenous 3 times  per day 04/13/13 0010        Assessment: 68 yo man on cefepime PTA for post-op lumbar wound infection with pseudomonas.  He was admitted to [facility] from MD's office with hypotension and suspected sepsis.  PICC line taken out 1/6 and replaced 1/8.  WBC 6.4, creat 0.63, afebrile.  1/5 BCx2 >>ngtd 1/5 Ucx NGF 1/6 PICC cath tip >> ngx2 days  Plan:  Continue cefepime 2 gm IV q8h to continue until Jan 24th to complete 6 week course. Eudelia Bunch,  Pharm.D. [phone] 04/16/2013 2:56 PM

## 2013-04-16 NOTE — ED Provider Notes (Signed)
Medical screening examination/treatment/procedure(s) were conducted as a shared visit with non-physician practitioner(s) and myself.  I personally evaluated the patient during the encounter.  EKG Interpretation    Date/Time:    Ventricular Rate:    PR Interval:    QRS Duration:   QT Interval:    QTC Calculation:   R Axis:     Text Interpretation:              Patient with previously treated wound infection secondary to lumbar surgery presented to the ER at the request of infectious disease over suspected worsening symptoms. Patient hypotensive, likely septic. Adrenal crisis also considered, as patient is chronically on steroids. Patient will require hospitalization for stress dose steroid treatment as well as broader spectrum antibiotic coverage.  Orpah Greek, MD 04/16/13 [phone]

## 2013-04-17 ENCOUNTER — Inpatient Hospital Stay (HOSPITAL_COMMUNITY): Payer: Medicare Other

## 2013-04-17 DIAGNOSIS — K219 Gastro-esophageal reflux disease without esophagitis: Secondary | ICD-10-CM

## 2013-04-17 MED ORDER — GI COCKTAIL ~~LOC~~
30.0000 mL | Freq: Once | ORAL | Status: AC
  Administered 2013-04-17: 30 mL via ORAL
  Filled 2013-04-17: qty 30

## 2013-04-17 MED ORDER — FLEET ENEMA 7-19 GM/118ML RE ENEM
1.0000 | ENEMA | Freq: Once | RECTAL | Status: AC
  Administered 2013-04-17: 1 via RECTAL
  Filled 2013-04-17: qty 1

## 2013-04-17 NOTE — Progress Notes (Signed)
[facility] PROGRESS NOTE  [name] JSE:[id number] DOB: March 30, 1946 DOA: 04/12/2013 PCP: Adella Hare, MD  Assessment/Plan: .  Hypotension -Unclear etiology, meds versus infection versus adrenal insufficiency -Adrenal insufficiency less likely as serum cortisols so far normal x2(noted that patient has been on chronic low-dose prednisone and blood pressures on admission responded well to steroids) -Status post PICC line removal -Catheter tip and blood cultures so far with no growth to date-continue cefepime -Appreciate ID input, patient to continue cefepime until January 24 -Resolved off antihypertensives, steroids and IV fluids, continue antibiotics  -BP elevated this a.m. Will begin tapering prednisone and resume Cozaar at decreased dose -Cultures remained with no growth to date, will order the replacement as recommended per ID.  [name] Rheumatoid arthritis(714.0) -Continue prednisone as above.  [name] HYPERTENSION -Continue Cozaar at current dose, blood pressure stable the  . Dehydration hyponatremia -resume hydration with normal saline and follow . Chronic steroid use - tapering prednisone-he usually is on maintenance dose.   . Status post multilevel lumbar fusion with probable wound infection -Status post I&D on 12/11 -Appreciate neurosurgery input -Cultures still with no growth, status post PICC replacement -Continue pain management . Recent Pseudomonas urinary tract infection-continue cefepime . Hoarseness -I consulted ENT on 1/8, appreciate Dr.Gore's input>> no vocal cord injury -Speech therapy consulted as recommended .Abd discomfort/constipation -fleet enema -continue ppi, add GI cocktail  Code Status: Full Family Communication: None at bedside Disposition Plan: to home when medically ready   Consultants:  ID  Neurosurgery  Procedures:  None  Antibiotics:  Cefepime to be continued through 1/23  HPI/Subjective: C/o pain in his mid to upper abd area.  C/o constipation. Denies n/v  Objective: Filed Vitals:   04/17/13 1333  BP: 133/83  Pulse: 110  Temp: 97.9 F (36.6 C)  Resp: 18    Intake/Output Summary (Last 24 hours) at 04/17/13 1831 Last data filed at 04/17/13 1630  Gross per 24 hour  Intake    960 ml  Output   1500 ml  Net   -540 ml   Filed Weights   04/12/13 1603  Weight: 62.143 kg (137 lb)    Exam:  General: alert & oriented x 3 In NAD Cardiovascular: RRR, nl S1 s2 Respiratory: CTAB Abdomen: soft +BS ND,mild upper abd tenderness no masses palpable Extremities: No cyanosis and no edema    Data Reviewed: Basic Metabolic Panel:  Recent Labs Lab 04/12/13 1610 04/15/13 0248  NA 130* 137  K 4.0 3.8  CL 93* 100  CO2 25 28  GLUCOSE 111* 120*  BUN 15 14  CREATININE 0.83 0.63  CALCIUM 8.6 8.3*   Liver Function Tests:  Recent Labs Lab 04/12/13 1610  AST 16  ALT 11  ALKPHOS 121*  BILITOT 0.4  PROT 6.5  ALBUMIN 2.9*   No results found for this basename: LIPASE, AMYLASE,  in the last 168 hours No results found for this basename: AMMONIA,  in the last 168 hours CBC:  Recent Labs Lab 04/12/13 1610 04/15/13 0248  WBC 8.6 6.4  NEUTROABS 6.0  --   HGB 11.7* 10.1*  HCT 35.8* 30.1*  MCV 89.3 88.0  PLT 304 336   Cardiac Enzymes: No results found for this basename: CKTOTAL, CKMB, CKMBINDEX, TROPONINI,  in the last 168 hours BNP (last 3 results) No results found for this basename: PROBNP,  in the last 8760 hours CBG: No results found for this basename: GLUCAP,  in the last 168 hours  Recent Results (from the past 240  hour(s))  CULTURE, BLOOD (ROUTINE X 2)     Status: None   Collection Time    04/12/13  9:40 PM      Result Value Range Status   Specimen Description BLOOD LEFT ARM   Final   Special Requests BOTTLES DRAWN AEROBIC AND ANAEROBIC 10CC EA   Final   Culture  Setup Time     Final   Value: 04/13/2013 03:58     Performed at [facility]   Culture     Final   Value:         BLOOD CULTURE RECEIVED NO GROWTH TO DATE CULTURE WILL BE HELD FOR 5 DAYS BEFORE ISSUING A FINAL NEGATIVE REPORT     Performed at [facility]   Report Status PENDING   Incomplete  CULTURE, BLOOD (ROUTINE X 2)     Status: None   Collection Time    04/12/13  9:45 PM      Result Value Range Status   Specimen Description BLOOD LEFT HAND   Final   Special Requests BOTTLES DRAWN AEROBIC ONLY 5CC   Final   Culture  Setup Time     Final   Value: 04/13/2013 03:58     Performed at [facility]   Culture     Final   Value:        BLOOD CULTURE RECEIVED NO GROWTH TO DATE CULTURE WILL BE HELD FOR 5 DAYS BEFORE ISSUING A FINAL NEGATIVE REPORT     Performed at [facility]   Report Status PENDING   Incomplete  URINE CULTURE     Status: None   Collection Time    04/12/13 11:35 PM      Result Value Range Status   Specimen Description URINE, CLEAN CATCH   Final   Special Requests NONE   Final   Culture  Setup Time     Final   Value: 04/13/2013 00:49     Performed at [facility]     Final   Value: NO GROWTH     Performed at [facility]   Culture     Final   Value: NO GROWTH     Performed at [facility]   Report Status 04/14/2013 FINAL   Final  CATH TIP CULTURE     Status: None   Collection Time    04/13/13 12:59 AM      Result Value Range Status   Specimen Description CATH TIP PICC LINE   Final   Special Requests NONE   Final   Culture     Final   Value: NO GROWTH 2 DAYS     Performed at [facility]   Report Status 04/15/2013 FINAL   Final  MRSA PCR SCREENING     Status: None   Collection Time    04/13/13  5:16 PM      Result Value Range Status   MRSA by PCR NEGATIVE  NEGATIVE Final   Comment:            The GeneXpert MRSA Assay (FDA     approved for NASAL specimens     only), is one component of a     comprehensive MRSA colonization     surveillance program. It is not     intended to diagnose MRSA      infection nor to guide or     monitor treatment for     MRSA  infections.     Studies: No results found.  Scheduled Meds: . aspirin EC  81 mg Oral q morning - 10a  . ceFEPime (MAXIPIME) IV  2 g Intravenous Q8H  . cholecalciferol  1,000 Units Oral Daily  . colchicine  0.6 mg Oral BID  . docusate sodium  100 mg Oral BID  . feeding supplement (ENSURE COMPLETE)  237 mL Oral BID BM  . FLUoxetine  40 mg Oral BID  . fluticasone  2 spray Each Nare Daily  . gabapentin  300 mg Oral BID  . gabapentin  600 mg Oral BID  . losartan  25 mg Oral Daily  . pantoprazole  40 mg Oral BID  . predniSONE  20 mg Oral Q breakfast  . simvastatin  40 mg Oral Daily  . sodium chloride  3 mL Intravenous Q12H  . tiotropium  18 mcg Inhalation Daily  . traZODone  100 mg Oral QHS   Continuous Infusions:   Principal Problem:   Sepsis associated hypotension Active Problems:   HYPERTENSION   Rheumatoid arthritis(714.0)   Dehydration   Wound infection after surgery   Adrenal insufficiency   Chronic use of steroids   Protein-calorie malnutrition, severe    Time spent: [facility] Pager [phone]. If 7PM-7AM, please contact night-coverage at [email or web address], password [facility] 04/17/2013, 6:31 PM  LOS: 5 days

## 2013-04-17 NOTE — Progress Notes (Signed)
C/o abd pain  Temp:  [97.9 F (36.6 C)-98.5 F (36.9 C)] 98.5 F (36.9 C) (01/10 0418) Pulse Rate:  [77-88] 88 (01/10 0418) Resp:  [17-18] 17 (01/10 0418) BP: (113-154)/(69-92) 113/69 mmHg (01/10 0418) SpO2:  [94 %-100 %] 94 % (01/10 0418) Pt stable  Plan: [place] to f/u next week

## 2013-04-18 LAB — BASIC METABOLIC PANEL
BUN: 9 mg/dL (ref 6–23)
CO2: 29 mEq/L (ref 19–32)
Calcium: 8.4 mg/dL (ref 8.4–10.5)
Chloride: 97 mEq/L (ref 96–112)
Creatinine, Ser: 0.47 mg/dL — ABNORMAL LOW (ref 0.50–1.35)
GFR calc Af Amer: >90 mL/min (ref >90)
GFR calc non Af Amer: >90 mL/min (ref >90)
Glucose, Bld: 88 mg/dL (ref 70–99)
Potassium: 3.7 mEq/L (ref 3.7–5.3)
Sodium: 137 mEq/L (ref 137–147)

## 2013-04-18 LAB — LIPASE, BLOOD: Lipase: 28 U/L (ref 11–59)

## 2013-04-18 MED ORDER — PREDNISONE 20 MG PO TABS
20.0000 mg | ORAL_TABLET | Freq: Once | ORAL | Status: DC
  Filled 2013-04-18: qty 1

## 2013-04-18 MED ORDER — SIMETHICONE 80 MG PO CHEW
80.0000 mg | CHEWABLE_TABLET | Freq: Four times a day (QID) | ORAL | Status: DC | PRN
  Administered 2013-04-22: 80 mg via ORAL
  Filled 2013-04-18: qty 1

## 2013-04-18 MED ORDER — PREDNISONE 20 MG PO TABS
40.0000 mg | ORAL_TABLET | Freq: Every day | ORAL | Status: DC
  Filled 2013-04-18 (×2): qty 2

## 2013-04-18 MED ORDER — SODIUM CHLORIDE 0.9 % IV SOLN
Freq: Once | INTRAVENOUS | Status: AC
  Administered 2013-04-18: 10:00:00 via INTRAVENOUS

## 2013-04-18 NOTE — Progress Notes (Signed)
[facility] PROGRESS NOTE  [name] JSH:[id number] DOB: 03/09/1946 DOA: 04/12/2013 PCP: Adella Hare, MD  Assessment/Plan: .  Hypotension -Unclear etiology, meds versus infection versus adrenal insufficiency -Adrenal insufficiency less likely as serum cortisols so far normal x2(noted that patient has been on chronic low-dose prednisone and blood pressures on admission responded well to steroids) -Status post PICC line removal -Catheter tip and blood cultures so far with no growth to date-continue cefepime -Appreciate ID input, patient to continue cefepime until January 24 -Resolved off antihypertensives, steroids and IV fluids, continue antibiotics  -BP elevated this a.m. Will begin tapering prednisone and resume Cozaar at decreased dose -Cultures remained with no growth to date, will order the replacement as recommended per ID.  [name] Rheumatoid arthritis(714.0) -Continue prednisone as above.  [name] HYPERTENSION -Continue Cozaar at current dose, blood pressure stable the  . Dehydration hyponatremia -resume hydration with normal saline and follow . Chronic steroid use - tapering prednisone-he usually is on maintenance dose.   . Status post multilevel lumbar fusion with probable wound infection -Status post I&D on 12/11 -Appreciate neurosurgery input -Cultures still with no growth, status post PICC replacement -Continue pain management . Recent Pseudomonas urinary tract infection-continue cefepime . Hoarseness -I consulted ENT on 1/8, appreciate Dr.Gore's input>> no vocal cord injury -Speech therapy consulted as recommended .Abd discomfort/constipation -Status post fleet enema with result -continue ppi, add GI cocktail -Will add simethicone - abdominal x-ray negative for obstruction and, lipase within normal limits  Code Status: Full Family Communication: None at bedside Disposition Plan: to home when medically  ready   Consultants:  ID  Neurosurgery  Procedures:  None  Antibiotics:  Cefepime to be continued through 1/23  HPI/Subjective:  + BM yesterday status post enema, but still with complaints of abdominal pain and states he does does not feel he will do okay with the pain at home  Objective: Filed Vitals:   04/18/13 1311  BP: 125/82  Pulse: 94  Temp: 98.7 F (37.1 C)  Resp: 19    Intake/Output Summary (Last 24 hours) at 04/18/13 1334 Last data filed at 04/18/13 1016  Gross per 24 hour  Intake    800 ml  Output   1800 ml  Net  -1000 ml   Filed Weights   04/12/13 1603  Weight: 62.143 kg (137 lb)    Exam:  General: alert & oriented x 3 In NAD Cardiovascular: RRR, nl S1 s2 Respiratory: CTAB Abdomen: soft +BS ND, mild diffuse tenderness Extremities: No cyanosis and no edema    Data Reviewed: Basic Metabolic Panel:  Recent Labs Lab 04/12/13 1610 04/15/13 0248 04/18/13 0447  NA 130* 137 137  K 4.0 3.8 3.7  CL 93* 100 97  CO2 25 28 29   GLUCOSE 111* 120* 88  BUN 15 14 9   CREATININE 0.83 0.63 0.47*  CALCIUM 8.6 8.3* 8.4   Liver Function Tests:  Recent Labs Lab 04/12/13 1610  AST 16  ALT 11  ALKPHOS 121*  BILITOT 0.4  PROT 6.5  ALBUMIN 2.9*    Recent Labs Lab 04/18/13 0447  LIPASE 28   No results found for this basename: AMMONIA,  in the last 168 hours CBC:  Recent Labs Lab 04/12/13 1610 04/15/13 0248  WBC 8.6 6.4  NEUTROABS 6.0  --   HGB 11.7* 10.1*  HCT 35.8* 30.1*  MCV 89.3 88.0  PLT 304 336   Cardiac Enzymes: No results found for this basename: CKTOTAL, CKMB, CKMBINDEX, TROPONINI,  in the last 168 hours  BNP (last 3 results) No results found for this basename: PROBNP,  in the last 8760 hours CBG: No results found for this basename: GLUCAP,  in the last 168 hours  Recent Results (from the past 240 hour(s))  CULTURE, BLOOD (ROUTINE X 2)     Status: None   Collection Time    04/12/13  9:40 PM      Result Value Range  Status   Specimen Description BLOOD LEFT ARM   Final   Special Requests BOTTLES DRAWN AEROBIC AND ANAEROBIC 10CC EA   Final   Culture  Setup Time     Final   Value: 04/13/2013 03:58     Performed at [facility]   Culture     Final   Value:        BLOOD CULTURE RECEIVED NO GROWTH TO DATE CULTURE WILL BE HELD FOR 5 DAYS BEFORE ISSUING A FINAL NEGATIVE REPORT     Performed at [facility]   Report Status PENDING   Incomplete  CULTURE, BLOOD (ROUTINE X 2)     Status: None   Collection Time    04/12/13  9:45 PM      Result Value Range Status   Specimen Description BLOOD LEFT HAND   Final   Special Requests BOTTLES DRAWN AEROBIC ONLY 5CC   Final   Culture  Setup Time     Final   Value: 04/13/2013 03:58     Performed at [facility]   Culture     Final   Value:        BLOOD CULTURE RECEIVED NO GROWTH TO DATE CULTURE WILL BE HELD FOR 5 DAYS BEFORE ISSUING A FINAL NEGATIVE REPORT     Performed at [facility]   Report Status PENDING   Incomplete  URINE CULTURE     Status: None   Collection Time    04/12/13 11:35 PM      Result Value Range Status   Specimen Description URINE, CLEAN CATCH   Final   Special Requests NONE   Final   Culture  Setup Time     Final   Value: 04/13/2013 00:49     Performed at [place]     Final   Value: NO GROWTH     Performed at [facility]   Culture     Final   Value: NO GROWTH     Performed at [facility]   Report Status 04/14/2013 FINAL   Final  CATH TIP CULTURE     Status: None   Collection Time    04/13/13 12:59 AM      Result Value Range Status   Specimen Description CATH TIP PICC LINE   Final   Special Requests NONE   Final   Culture     Final   Value: NO GROWTH 2 DAYS     Performed at [facility]   Report Status 04/15/2013 FINAL   Final  MRSA PCR SCREENING     Status: None   Collection Time    04/13/13  5:16 PM      Result Value Range Status   MRSA by PCR  NEGATIVE  NEGATIVE Final   Comment:            The GeneXpert MRSA Assay (FDA     approved for NASAL specimens     only), is one component of a     comprehensive MRSA colonization  surveillance program. It is not     intended to diagnose MRSA     infection nor to guide or     monitor treatment for     MRSA infections.     Studies: Dg Abd Portable 1v  04/17/2013   CLINICAL DATA:  Abdominal pain  EXAM: PORTABLE ABDOMEN - 1 VIEW  COMPARISON:  None.  FINDINGS: Postsurgical changes are noted in the lower lumbar spine. Scattered large and small bowel gas is noted although no to obstructive changes are seen. No free air is noted. No abnormal mass or abnormal calcifications are seen.  IMPRESSION: Postoperative changes.  Scattered large and small bowel gas without obstructive change.   Electronically Signed   By: Inez Catalina M.D.   On: 04/17/2013 20:25    Scheduled Meds: . sodium chloride   Intravenous Once  . aspirin EC  81 mg Oral q morning - 10a  . ceFEPime (MAXIPIME) IV  2 g Intravenous Q8H  . cholecalciferol  1,000 Units Oral Daily  . colchicine  0.6 mg Oral BID  . docusate sodium  100 mg Oral BID  . feeding supplement (ENSURE COMPLETE)  237 mL Oral BID BM  . FLUoxetine  40 mg Oral BID  . fluticasone  2 spray Each Nare Daily  . gabapentin  300 mg Oral BID  . gabapentin  600 mg Oral BID  . losartan  25 mg Oral Daily  . pantoprazole  40 mg Oral BID  . predniSONE  20 mg Oral Q breakfast  . simvastatin  40 mg Oral Daily  . sodium chloride  3 mL Intravenous Q12H  . tiotropium  18 mcg Inhalation Daily  . traZODone  100 mg Oral QHS   Continuous Infusions:   Principal Problem:   Sepsis associated hypotension Active Problems:   HYPERTENSION   Rheumatoid arthritis(714.0)   Dehydration   Wound infection after surgery   Adrenal insufficiency   Chronic use of steroids   Protein-calorie malnutrition, severe    Time spent: [place] [facility] Pager  [phone]. If 7PM-7AM, please contact night-coverage at [email or web address], password [facility] 04/18/2013, 1:34 PM  LOS: 6 days

## 2013-04-18 NOTE — Progress Notes (Signed)
Changed patient's dressing per MD order with Aquacell and Mepilex foam dressing.

## 2013-04-19 ENCOUNTER — Telehealth: Payer: Self-pay | Admitting: Emergency Medicine

## 2013-04-19 ENCOUNTER — Inpatient Hospital Stay (HOSPITAL_COMMUNITY): Payer: Medicare Other

## 2013-04-19 ENCOUNTER — Encounter (HOSPITAL_COMMUNITY): Payer: Self-pay | Admitting: Radiology

## 2013-04-19 DIAGNOSIS — K56609 Unspecified intestinal obstruction, unspecified as to partial versus complete obstruction: Secondary | ICD-10-CM

## 2013-04-19 DIAGNOSIS — I10 Essential (primary) hypertension: Secondary | ICD-10-CM

## 2013-04-19 LAB — CULTURE, BLOOD (ROUTINE X 2)
Culture: NO GROWTH
Culture: NO GROWTH

## 2013-04-19 MED ORDER — METHYLPREDNISOLONE SODIUM SUCC 40 MG IJ SOLR
20.0000 mg | Freq: Every day | INTRAMUSCULAR | Status: DC
  Administered 2013-04-19 – 2013-04-22 (×4): 20 mg via INTRAVENOUS
  Filled 2013-04-19 (×4): qty 0.5

## 2013-04-19 MED ORDER — LORAZEPAM 2 MG/ML IJ SOLN: 0.5000 mg | Freq: Three times a day (TID) | INTRAMUSCULAR | Status: DC | PRN

## 2013-04-19 MED ORDER — DOCUSATE SODIUM 50 MG/5ML PO LIQD
100.0000 mg | Freq: Every day | ORAL | Status: DC | PRN
  Filled 2013-04-19: qty 10

## 2013-04-19 MED ORDER — GABAPENTIN 250 MG/5ML PO SOLN
300.0000 mg | ORAL | Status: DC
  Administered 2013-04-20 – 2013-04-21 (×2): 300 mg
  Filled 2013-04-19 (×5): qty 6

## 2013-04-19 MED ORDER — LIDOCAINE HCL 2 % EX GEL
Freq: Once | CUTANEOUS | Status: AC
  Administered 2013-04-19: 5
  Filled 2013-04-19: qty 5

## 2013-04-19 MED ORDER — DOCUSATE SODIUM 50 MG/5ML PO LIQD: 100.0000 mg | Freq: Two times a day (BID) | ORAL | Status: DC

## 2013-04-19 MED ORDER — GABAPENTIN 600 MG PO TABS
600.0000 mg | ORAL_TABLET | Freq: Once | ORAL | Status: AC
  Administered 2013-04-19: 600 mg via ORAL

## 2013-04-19 MED ORDER — IOHEXOL 300 MG/ML  SOLN: 25.0000 mL | INTRAMUSCULAR | Status: AC

## 2013-04-19 MED ORDER — DOCUSATE SODIUM 50 MG/5ML PO LIQD
100.0000 mg | Freq: Two times a day (BID) | ORAL | Status: DC
  Administered 2013-04-20 – 2013-04-21 (×3): 100 mg
  Filled 2013-04-19 (×6): qty 10

## 2013-04-19 MED ORDER — GABAPENTIN 250 MG/5ML PO SOLN
600.0000 mg | Freq: Two times a day (BID) | ORAL | Status: DC
  Administered 2013-04-20: 600 mg
  Filled 2013-04-19 (×6): qty 12

## 2013-04-19 MED ORDER — IOHEXOL 300 MG/ML  SOLN
100.0000 mL | Freq: Once | INTRAMUSCULAR | Status: AC | PRN
  Administered 2013-04-19: 100 mL via INTRAVENOUS

## 2013-04-19 MED ORDER — SODIUM CHLORIDE 0.9 % IV SOLN
INTRAVENOUS | Status: DC
  Administered 2013-04-19 – 2013-04-22 (×5): via INTRAVENOUS
  Administered 2013-04-23 – 2013-04-24 (×2): 20 mL/h via INTRAVENOUS

## 2013-04-19 MED ORDER — PANTOPRAZOLE SODIUM 40 MG IV SOLR
40.0000 mg | Freq: Two times a day (BID) | INTRAVENOUS | Status: DC
  Administered 2013-04-19 – 2013-04-21 (×5): 40 mg via INTRAVENOUS
  Filled 2013-04-19 (×6): qty 40

## 2013-04-19 NOTE — Consult Note (Signed)
Reason for Consult: small bowel obstruction  Referring Physician: Dr. Joslyn Devon  PCP: Fredric Dine   HPI: [name] is a 68 year old male with a history of hemachromatosis, RA, HTN, chronic corticosteroid therapy, s/p back fusion 03/03/13 developed a post operative wound  infection treated with antibiotics, I&D with neurosurgery.  He was admitted most recently on the 6th with hypotension. He reports abdominal for the past 3-4 days. He reports that he was "really blocked up" and was given enemas, laxatives.  Complains of nausea and vomiting, bilious output which began yesterday.  Passed flatus this morning, last BM recorded yesterday.  He reports a history of adhesions, meckels diverticulum and underwent a exploratory laparotomy with LOA age 75, appendectomy at age 49.  He reports bilateral hernia repair.  He had a similar episode about 4 years ago of small bowel obstruction which was treated medically.  Denies dysphagia, not associated with particular food intake.   CT suggests a small bowel obstruction with to RLQ where there is narrowing of distal small bowel loops with mild circumferential thickening.  He has a normal white count.  Denies fever, chills or sweats.  No recent weight loss.  Family history significant for father with colon cancer. He lost 25lbs over the last 7 weeks.  He hasnt eaten much over the last 4  Days.     Past Medical History  Diagnosis Date  . Hx of colonic polyps   . Diverticulosis   . Hypertension   . Osteoarthritis   . 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 CAD (coronary artery disease)     minimal coronary plaque in the LAD and right coronary system. 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 7-06. Last nuclear stress 11-17-06 fixed anterior/ inferior defect, no inducible ischemia, EF 81%  . Allergic rhinitis   . Hx of colonoscopy   . RA (rheumatoid  arthritis)   . COPD (chronic obstructive pulmonary disease)   . PONV (postoperative nausea and vomiting)   . Dysrhythmia 01-24-12    past hx. A.Fib x1 episode-responded to med.  . Depression   . Anxiety     pt. feels its related to the medicine that he takes   . GERD (gastroesophageal reflux disease)     Past Surgical History  Procedure Laterality Date  . Appendectomy    . Inguinal hernia repair  2008    Right, left remotely  . Total knee arthroplasty      right knee partial 2002  . Tonsillectomy    . Shoulder preplacement  2008    left, partial  . Ptca w/ coated stent lad      Cx secondary to tear  . Foot surgery  11-08    for removal of bone spurs- right foot  . Reconstruction of ankle  6-09    Right- [facility]  . L4-5 diskectomy w/ fusion, cage placement and rods  12-10    [place]  . Partial knee arthroplasty      left  . Total shoulder replacement      right  . Joint replacement    . Cataract extraction, bilateral  01-24-12    Bilateral  . Orif shoulder fracture  02/06/2012    Procedure: OPEN REDUCTION INTERNAL FIXATION (ORIF) SHOULDER FRACTURE;  Surgeon: Nita Sells, MD;  Location: [facility];  Service: Orthopedics;  Laterality: Left;  ORIF  of a Left Shoulder Fracture with  Iliac Crest Bone Graft aspiration   . Harvest bone graft  02/06/2012    Procedure: HARVEST ILIAC BONE GRAFT;  Surgeon: Nita Sells, MD;  Location: [facility];  Service: Orthopedics;;  bone marrow aspirqation   . Hardware removal  03/09/2012    Procedure: HARDWARE REMOVAL;  Surgeon: Nita Sells, MD;  Location: [place];  Service: Orthopedics;  Laterality: Left;  Hardware Removal from Left Shoulder  . Cardiac catheterization  2006    coronary stents- no problems  . Shoulder arthroscopy  2013  . Carpal tunnel release      R hand  . Ankle surgery Right 2008    at [facility], replacement   . Dg toes*r*      toe surgery- 07/2012  . Posterior lumbar fusion 4 level  N/A 03/02/2013    Procedure: Lumbar One to Sacral One Posterior lumbar interbody fusion;  Surgeon: Floyce Stakes, MD;  Location: [place] [facility];  Service: Neurosurgery;  Laterality: N/A;  L1 to S1 Posterior lumbar interbody fusion  . Lumbar wound debridement N/A 03/17/2013    Procedure: Incision and drainage of superficial lumbar wound;  Surgeon: Floyce Stakes, MD;  Location: [place] [facility];  Service: Neurosurgery;  Laterality: N/A;  Incision and drainage of superficial lumbar wound    Family History  Problem Relation Age of Onset  . Uterine cancer Mother     survivor  . Macular degeneration Mother   . Other Mother     ankle edema  . Lung cancer Mother   . Cancer Mother     ovarian, lung  . Coronary artery disease Father   . Hypertension Father   . Prostate cancer Father   . Colon polyps Father   . Cancer Father     prostate  . Thyroid disease Sister   . Heart attack Brother   . Hyperlipidemia Brother   . Other Brother     Schizophrenic  . Diabetes Neg Hx   . Colon cancer Neg Hx   . Esophageal cancer Neg Hx   . Rectal cancer Neg Hx   . Stomach cancer Neg Hx   . Coronary artery disease Maternal Aunt   . Heart attack Maternal Aunt   . Rheum arthritis Sister   . Hemochromatosis Sister     Social History:  reports that he has never smoked. He has never used smokeless tobacco. He reports that he does not drink alcohol or use illicit drugs.  Allergies:  Allergies  Allergen Reactions  . Morphine And Related Shortness Of Breath, Nausea And Vomiting and Swelling  . Penicillins Hives, Shortness Of Breath and Rash    REACTION: hives, breathing problems  . Percocet [Oxycodone-Acetaminophen] Anxiety    Medications:  Prior to Admission:  Prescriptions prior to admission  Medication Sig Dispense Refill  . Aclidinium Bromide (TUDORZA PRESSAIR) 400 MCG/ACT AEPB Inhale 1 puff into the lungs 2 (two) times daily.  1 each  11  . adalimumab (HUMIRA) 40 MG/0.8ML injection Inject 40  mg into the skin every 14 (fourteen) days.      . ALPRAZolam (XANAX) 0.5 MG tablet Take 1 tablet (0.5 mg total) by mouth 3 (three) times daily as needed for anxiety or sleep.  90 tablet  5  . aspirin (ASPIRIN EC) 81 MG EC tablet Take 81 mg by mouth every morning.       . celecoxib (CELEBREX) 200 MG capsule Take 200 mg by mouth 2 (  two) times daily.      . cholecalciferol (VITAMIN D) 1000 UNITS tablet Take 1 tablet (1,000 Units total) by mouth daily.  100 tablet  3  . colchicine 0.6 MG tablet Take 0.6 mg by mouth 2 (two) times daily.      . diazepam (VALIUM) 5 MG tablet Take 1 tablet (5 mg total) by mouth every 6 (six) hours as needed for muscle spasms.  30 tablet  0  . docusate sodium (COLACE) 100 MG capsule Take 100 mg by mouth daily as needed for mild constipation.      [name] FLUoxetine (PROZAC) 40 MG capsule Take 40 mg by mouth 2 (two) times daily.      . fluticasone (FLONASE) 50 MCG/ACT nasal spray Place 2 sprays into both nostrils daily.      . furosemide (LASIX) 20 MG tablet Take 20 mg by mouth daily.      [name] gabapentin (NEURONTIN) 300 MG capsule Take 300-600 mg by mouth 4 (four) times daily. 600 mg every morning, 300 mg at noon, 600 mg at bedtime, and 300 mg at 1 AM      . hydrochlorothiazide (HYDRODIURIL) 25 MG tablet Take 25 mg by mouth daily before breakfast.       . HYDROmorphone (DILAUDID) 4 MG tablet Take 4 mg by mouth 2 (two) times daily as needed for moderate pain or severe pain.      [name] losartan (COZAAR) 50 MG tablet Take 50 mg by mouth daily.      . Melatonin ([employer] MELATONIN) 5 MG TABS Take 5 mg by mouth at bedtime.       . Nutritional Supplements (ENSURE COMPLETE PO) Take 0.5 Cans by mouth 2 (two) times daily.      . OxyCODONE (OXYCONTIN) 40 mg T12A 12 hr tablet Take 40 mg by mouth every 8 (eight) hours as needed (pain).      . pantoprazole (PROTONIX) 40 MG tablet Take 40 mg by mouth 2 (two) times daily.       Vladimir Faster Glycol-Propyl Glycol (SYSTANE ULTRA) 0.4-0.3 % SOLN Place 2 drops  into both eyes 2 (two) times daily as needed (dry eyes).       . potassium chloride SA (K-DUR,KLOR-CON) 20 MEQ tablet Take 20 mEq by mouth daily.      . predniSONE (DELTASONE) 5 MG tablet Take 5 mg by mouth every evening.       . simvastatin (ZOCOR) 40 MG tablet Take 40 mg by mouth daily. At lunch      . traZODone (DESYREL) 100 MG tablet Take 100 mg by mouth at bedtime. Take just before bedtime      . zolpidem (AMBIEN) 5 MG tablet Take 1 tablet (5 mg total) by mouth at bedtime as needed for sleep.  30 tablet  0   Scheduled: . aspirin EC  81 mg Oral q morning - 10a  . ceFEPime (MAXIPIME) IV  2 g Intravenous Q8H  . cholecalciferol  1,000 Units Oral Daily  . colchicine  0.6 mg Oral BID  . docusate sodium  100 mg Oral BID  . feeding supplement (ENSURE COMPLETE)  237 mL Oral BID BM  . FLUoxetine  40 mg Oral BID  . fluticasone  2 spray Each Nare Daily  . gabapentin  300 mg Oral BID  . gabapentin  600 mg Oral BID  . losartan  25 mg Oral Daily  . methylPREDNISolone (SOLU-MEDROL) injection  20 mg Intravenous Daily  . pantoprazole (PROTONIX) IV  40  mg Intravenous Q12H  . simvastatin  40 mg Oral Daily  . sodium chloride  3 mL Intravenous Q12H  . tiotropium  18 mcg Inhalation Daily  . traZODone  100 mg Oral QHS   Continuous: . sodium chloride     UMP:NTIRWERX, docusate sodium, HYDROmorphone (DILAUDID) injection, HYDROmorphone, LORazepam, ondansetron (ZOFRAN) IV, ondansetron, oxymetazoline, polyvinyl alcohol, simethicone, sodium chloride, sodium phosphate, zolpidem  Results for orders placed during the hospital encounter of 04/12/13 (from the past 48 hour(s))  BASIC METABOLIC PANEL     Status: Abnormal   Collection Time    04/18/13  4:47 AM      Result Value Range   Sodium 137  137 - 147 mEq/L   Potassium 3.7  3.7 - 5.3 mEq/L   Chloride 97  96 - 112 mEq/L   CO2 29  19 - 32 mEq/L   Glucose, Bld 88  70 - 99 mg/dL   BUN 9  6 - 23 mg/dL   Creatinine, Ser 0.47 (*) 0.50 - 1.35 mg/dL   Calcium  8.4  8.4 - 10.5 mg/dL   GFR calc non Af Amer >90  >90 mL/min   GFR calc Af Amer >90  >90 mL/min   Comment: (NOTE)     The eGFR has been calculated using the CKD EPI equation.     This calculation has not been validated in all clinical situations.     eGFR's persistently <90 mL/min signify possible Chronic Kidney     Disease.  LIPASE, BLOOD     Status: None   Collection Time    04/18/13  4:47 AM      Result Value Range   Lipase 28  11 - 59 U/L    Ct Abdomen Pelvis W Contrast  04/19/2013   CLINICAL DATA:  Abdominal pain. Post appendectomy and inguinal hernia repair.  EXAM: CT ABDOMEN AND PELVIS WITH CONTRAST  TECHNIQUE: Multidetector CT imaging of the abdomen and pelvis was performed using the standard protocol following bolus administration of intravenous contrast.  CONTRAST:  142m OMNIPAQUE IOHEXOL 300 MG/ML  SOLN  COMPARISON:  08/11/2012 CT.  FINDINGS: Gas and fluid-filled dilated small bowel loops to level of the right lower quadrant where there is narrowing of distal small bowel loops with mild circumferential thickening which may represent the presence of an adhesion/stricture. Although less likely, primary mass is not excluded. Ascites without evidence of free intraperitoneal air.  Small fatty containing right inguinal hernia.  Right lower lobe consolidation may represent infiltrate and/ atelectasis. Recommend followup until clearance. Elevated right hemidiaphragm. This was noted previously.  Minimal amount of fluid inferior aspect left major fissure.  Right central line tip caval atrial junction/proximal right atrium.  Heart size within normal limits. Prominent coronary artery calcifications.  Atherosclerotic type changes of the abdominal aorta with prominent calcified plaque and mild narrowing also involves the descending thoracic aorta, abdominal aortic branch vessels and iliac/femoral arteries. Prominent narrowing superior mesenteric artery and moderate narrowing celiac artery. 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  Prominent sigmoid and descending colon diverticulosis.  Interval lumbar spine surgery with fusion L1 through S1. No solid bony fusion noted at the L1-2, L2-3, L3-4 and L5-S1 level. Anterior slippage of L4. The right the L1 pedicle screw does not enter the pedicle, located laterally with surrounding loosening. Loosening left L1 pedicle screws suspected. The left L1 pedicle screw partially enters the left aspect of the canal. L3 pedicle screws partially enter canal more notable on the right. Right L4 pedicle screw  partially enters the canal. L5 pedicle screws noted along the lateral aspect of the pedicle traversing the lateral anterior cortex. S1 pedicle screws traverse the anterior cortex. Prominent calcified structure posterior to the S1 vertebral body greater to left of midline.  Bilateral renal lesions larger ones which are cysts whereas some of the small ones cannot be adequately characterize. Large right parapelvic cyst.  No calcified gallstone. No worrisome hepatic, splenic, pancreatic or adrenal lesion.  IMPRESSION: Small bowel obstruction with gas and fluid-filled dilated small bowel loops to level of the right lower quadrant where there is narrowing of distal small bowel loops with mild circumferential thickening which may represent the presence of an adhesion/stricture. Although less likely, primary mass is not excluded. Ascites.  Small fatty containing right inguinal hernia.  Prominent sigmoid and descending colon diverticulosis.  Right lower lobe consolidation may represent infiltrate and/ atelectasis.  Prominent coronary artery calcifications.  Atherosclerotic type changes of the abdominal aorta and aortic branch vessels as detailed above. 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  Interval lumbar spine surgery with fusion L1 through S1. Please see above discussion as bony fusion is not confirmed on the current exam and there may be loosening of pedicle screws.  Please see above.  These  results will be called to the ordering clinician or representative by the Radiologist Assistant, and communication documented in the PACS Dashboard.   Electronically Signed   By: Chauncey Cruel M.D.   On: 04/19/2013 10:03   Dg Abd Portable 1v  04/17/2013   CLINICAL DATA:  Abdominal pain  EXAM: PORTABLE ABDOMEN - 1 VIEW  COMPARISON:  None.  FINDINGS: Postsurgical changes are noted in the lower lumbar spine. Scattered large and small bowel gas is noted although no to obstructive changes are seen. No free air is noted. No abnormal mass or abnormal calcifications are seen.  IMPRESSION: Postoperative changes.  Scattered large and small bowel gas without obstructive change.   Electronically Signed   By: Inez Catalina M.D.   On: 04/17/2013 20:25    Review of Systems  Constitutional: Negative for fever and chills.  Respiratory: Negative for wheezing.   Cardiovascular: Negative for chest pain and palpitations.  Gastrointestinal: Positive for nausea, vomiting, abdominal pain and constipation.  Genitourinary: Negative for dysuria and hematuria.  Neurological: Negative for dizziness, seizures, loss of consciousness, weakness and headaches.   Blood pressure 146/96, pulse 113, temperature 98.5 F (36.9 C), temperature source Oral, resp. rate 21, height 5' 6" (1.676 m), weight 137 lb (62.143 kg), SpO2 90.00%. Physical Exam  Constitutional: He is oriented to person, place, and time. He appears well-developed and well-nourished. No distress.  Cardiovascular: Normal rate, normal heart sounds and intact distal pulses.  Exam reveals no gallop.   No murmur heard. Respiratory: Effort normal and breath sounds normal. No respiratory distress. He has no wheezes. He has no rales. He exhibits no tenderness.  GI: Soft. Bowel sounds are normal. He exhibits distension. He exhibits no mass. There is no rebound and no guarding.  Right paramedian laparotomy, Rt mid quadrant vertical scar.  Mild diffuse tenderness, but focal RLQ  tenderness  Musculoskeletal: He exhibits no edema.  Neurological: He is alert and oriented to person, place, and time.  Skin: Skin is warm and dry. He is not diaphoretic.  Psychiatric: He has a normal mood and affect. His behavior is normal. Judgment and thought content normal.    Assessment/Plan: Partial small bowel obstruction He does not appear completely obstructed.  We recommend conservative management with  nasogastric tube to low intermittent suction, NPO x ice chips.  We will see how he does over the next 24-48h.  We will repeat films in AM.  Monitor electrolytes.  We need to consider TPN sooner rather than later given his weight loss and poor nutritional status over the last 7 weeks.  There are no indications for surgical intervention at this time.  Surgery will continue to follow.   Erby Pian ANP-BC Pager [phone] 04/19/2013, 11:52 AM

## 2013-04-19 NOTE — Progress Notes (Signed)
ANTIBIOTIC CONSULT NOTE - FOLLOW UP  Pharmacy Consult for cefepime Indication: pseudomonas post-op lumbar wound infection  Allergies  Allergen Reactions  . Morphine And Related Shortness Of Breath, Nausea And Vomiting and Swelling  . Penicillins Hives, Shortness Of Breath and Rash    REACTION: hives, breathing problems  . Percocet [Oxycodone-Acetaminophen] Anxiety    Patient Measurements: Height: 5' 6"  (167.6 cm) Weight: 137 lb (62.143 kg) IBW/kg (Calculated) : 63.8   Vital Signs: Temp: 98.5 F (36.9 C) (01/12 0409) Temp src: Oral (01/12 0409) BP: 146/96 mmHg (01/12 0409) Pulse Rate: 113 (01/12 0409) Intake/Output from previous day: 01/11 0701 - 01/12 0700 In: 580 [P.O.:480; IV Piggyback:100] Out: 2451 [Urine:2450; Stool:1] Intake/Output from this shift: Total I/O In: -  Out: 400 [Urine:400]  Labs:  Recent Labs  04/18/13 0447  CREATININE 0.47*   Estimated Creatinine Clearance: 80.9 ml/min (by C-G formula based on Cr of 0.47).   Microbiology: Recent Results (from the past 720 hour(s))  CULTURE, BLOOD (ROUTINE X 2)     Status: None   Collection Time    04/12/13  9:40 PM      Result Value Range Status   Specimen Description BLOOD LEFT ARM   Final   Special Requests BOTTLES DRAWN AEROBIC AND ANAEROBIC 10CC EA   Final   Culture  Setup Time     Final   Value: 04/13/2013 03:58     Performed at [facility]   Culture     Final   Value: NO GROWTH 5 DAYS     Performed at [facility]   Report Status 04/19/2013 FINAL   Final  CULTURE, BLOOD (ROUTINE X 2)     Status: None   Collection Time    04/12/13  9:45 PM      Result Value Range Status   Specimen Description BLOOD LEFT HAND   Final   Special Requests BOTTLES DRAWN AEROBIC ONLY 5CC   Final   Culture  Setup Time     Final   Value: 04/13/2013 03:58     Performed at [facility]   Culture     Final   Value: NO GROWTH 5 DAYS     Performed at [facility]   Report Status  04/19/2013 FINAL   Final  URINE CULTURE     Status: None   Collection Time    04/12/13 11:35 PM      Result Value Range Status   Specimen Description URINE, CLEAN CATCH   Final   Special Requests NONE   Final   Culture  Setup Time     Final   Value: 04/13/2013 00:49     Performed at [place]     Final   Value: NO GROWTH     Performed at [facility]   Culture     Final   Value: NO GROWTH     Performed at [facility]   Report Status 04/14/2013 FINAL   Final  CATH TIP CULTURE     Status: None   Collection Time    04/13/13 12:59 AM      Result Value Range Status   Specimen Description CATH TIP PICC LINE   Final   Special Requests NONE   Final   Culture     Final   Value: NO GROWTH 2 DAYS     Performed at [facility]   Report Status 04/15/2013 FINAL   Final  MRSA PCR SCREENING     Status: None   Collection Time    04/13/13  5:16 PM      Result Value Range Status   MRSA by PCR NEGATIVE  NEGATIVE Final   Comment:            The GeneXpert MRSA Assay (FDA     approved for NASAL specimens     only), is one component of a     comprehensive MRSA colonization     surveillance program. It is not     intended to diagnose MRSA     infection nor to guide or     monitor treatment for     MRSA infections.    Anti-infectives   Start     Dose/Rate Route Frequency Ordered Stop   04/13/13 0015  ceFEPIme (MAXIPIME) 2 g in dextrose 5 % 50 mL IVPB     2 g 100 mL/hr over 30 Minutes Intravenous 3 times per day 04/13/13 0010        Assessment: 68 yo man on cefepime PTA for post-op lumbar wound infection with pseudomonas.  He was admitted to [facility] from MD's office with hypotension and suspected sepsis.  PICC line taken out 1/6 and replaced 1/8.  WBC wnl, afebrile. Scr 0.5 with est CrCl ~39m/min. No growth on cultures  Plan:  1. Continue cefepime 2 gm IV q8h to continue until Jan 24th to complete 6 week course per ID recommendations 2.  Continue to follow renal function, any new cultures  Kashonda Sarkisyan D. Jenin Birdsall, PharmD, BCPS Clinical Pharmacist Pager: [phone]/03/2014 2:19 PM

## 2013-04-19 NOTE — Telephone Encounter (Signed)
[facility] called in with CT/ABD results dated on 04/19/13 per Dr. Timoteo Gaul. Called back to speak with Erline Levine to inform this was not our pt but to give Dr. Timoteo Gaul the results. Erline Levine verbalized understanding. She will give provider results

## 2013-04-19 NOTE — Progress Notes (Signed)
#  63f NGT inserted w/out difficulty. + upon auscultation. Awaiting xray to confirm placement.BCindee Salt

## 2013-04-19 NOTE — Progress Notes (Signed)
NGT not in correct position per Xray. Reinserted and auscultated x RN and Agricultural consultant. Will repeat xray. Back dressing changed per order. Wound bed pink with no oozing or drainage noted; patient tolerated well. Call bell near. Will monitor.[name]

## 2013-04-19 NOTE — Consult Note (Signed)
Pt seen and examined.  Chart / films reviewed. pSBO at best.   Recommend NGT/NPO and follow labs films.  Poor operative candidate secondary to chronic steroids.  Will follow.  No acute surgical indication.

## 2013-04-19 NOTE — Progress Notes (Signed)
[facility] PROGRESS NOTE  [name] JHE:[id number] DOB: 1945/07/16 DOA: 04/12/2013 PCP: Adella Hare, MD  Assessment/Plan: .  Hypotension -Unclear etiology, meds versus infection versus adrenal insufficiency -Adrenal insufficiency less likely as serum cortisols so far normal x2(noted that patient has been on chronic low-dose prednisone and blood pressures on admission responded well to steroids) -Status post PICC line removal -Catheter tip and blood cultures so far with no growth to date-continue cefepime -Appreciate ID input, patient to continue cefepime until January 24 -Resolved off antihypertensives, steroids and IV fluids, continue antibiotics  -BP elevated this a.m. Will begin tapering prednisone and resume Cozaar at decreased dose -Cultures remained with no growth to date, will order the replacement as recommended per ID.  [name] Rheumatoid arthritis(714.0) -pt now NPO so will change to solumedrol for now and follow . HYPERTENSION -Continue Cozaar at current dose, blood pressure stable the  . Dehydration hyponatremia -resume hydration with normal saline and follow . Chronic steroid use - tapering prednisone-he usually is on maintenance dose.   . Status post multilevel lumbar fusion with probable wound infection -Status post I&D on 12/11 -Appreciate neurosurgery input -Cultures still with no growth, status post PICC replacement -Continue pain management . Recent Pseudomonas urinary tract infection-continue cefepime . Hoarseness -I consulted ENT on 1/8, appreciate Dr.Gore's input>> no vocal cord injury -Speech therapy consulted as recommended .SBO -CT abd this am with sbo with ?adhesion vs mass -will keep NPO, IVF, continue pain management and follow -I have consulted gen surgery for further recs .RLE  infiltrate vs consolidation -pt assymptomatic>> no cough or SOB,more likely ATX -incentive spirometry and follow up with PCP  Code Status: Full Family  Communication: None at bedside Disposition Plan: to home when medically ready   Consultants:  ID  Neurosurgery  Procedures:  None  Antibiotics:  Cefepime to be continued through 1/23  HPI/Subjective: Still with c/o abd pain, c/o n/v with some vomiting yesterday but not today. Objective: Filed Vitals:   04/19/13 0409  BP: 146/96  Pulse: 113  Temp: 98.5 F (36.9 C)  Resp: 21    Intake/Output Summary (Last 24 hours) at 04/19/13 1137 Last data filed at 04/19/13 0816  Gross per 24 hour  Intake    340 ml  Output   2051 ml  Net  -1711 ml   Filed Weights   04/12/13 1603  Weight: 62.143 kg (137 lb)    Exam:  General: alert & oriented x 3 In NAD Cardiovascular: RRR, nl S1 s2 Respiratory: CTAB Abdomen: soft decreasedBS,+tenderness,mild distension Extremities: No cyanosis and no edema    Data Reviewed: Basic Metabolic Panel:  Recent Labs Lab 04/12/13 1610 04/15/13 0248 04/18/13 0447  NA 130* 137 137  K 4.0 3.8 3.7  CL 93* 100 97  CO2 25 28 29   GLUCOSE 111* 120* 88  BUN 15 14 9   CREATININE 0.83 0.63 0.47*  CALCIUM 8.6 8.3* 8.4   Liver Function Tests:  Recent Labs Lab 04/12/13 1610  AST 16  ALT 11  ALKPHOS 121*  BILITOT 0.4  PROT 6.5  ALBUMIN 2.9*    Recent Labs Lab 04/18/13 0447  LIPASE 28   No results found for this basename: AMMONIA,  in the last 168 hours CBC:  Recent Labs Lab 04/12/13 1610 04/15/13 0248  WBC 8.6 6.4  NEUTROABS 6.0  --   HGB 11.7* 10.1*  HCT 35.8* 30.1*  MCV 89.3 88.0  PLT 304 336   Cardiac Enzymes: No results found for this basename: CKTOTAL, CKMB,  CKMBINDEX, TROPONINI,  in the last 168 hours BNP (last 3 results) No results found for this basename: PROBNP,  in the last 8760 hours CBG: No results found for this basename: GLUCAP,  in the last 168 hours  Recent Results (from the past 240 hour(s))  CULTURE, BLOOD (ROUTINE X 2)     Status: None   Collection Time    04/12/13  9:40 PM      Result Value  Range Status   Specimen Description BLOOD LEFT ARM   Final   Special Requests BOTTLES DRAWN AEROBIC AND ANAEROBIC 10CC EA   Final   Culture  Setup Time     Final   Value: 04/13/2013 03:58     Performed at [facility]   Culture     Final   Value: NO GROWTH 5 DAYS     Performed at [facility]   Report Status 04/19/2013 FINAL   Final  CULTURE, BLOOD (ROUTINE X 2)     Status: None   Collection Time    04/12/13  9:45 PM      Result Value Range Status   Specimen Description BLOOD LEFT HAND   Final   Special Requests BOTTLES DRAWN AEROBIC ONLY 5CC   Final   Culture  Setup Time     Final   Value: 04/13/2013 03:58     Performed at [facility]   Culture     Final   Value: NO GROWTH 5 DAYS     Performed at [facility]   Report Status 04/19/2013 FINAL   Final  URINE CULTURE     Status: None   Collection Time    04/12/13 11:35 PM      Result Value Range Status   Specimen Description URINE, CLEAN CATCH   Final   Special Requests NONE   Final   Culture  Setup Time     Final   Value: 04/13/2013 00:49     Performed at [facility]     Final   Value: NO GROWTH     Performed at [facility]   Culture     Final   Value: NO GROWTH     Performed at [facility]   Report Status 04/14/2013 FINAL   Final  CATH TIP CULTURE     Status: None   Collection Time    04/13/13 12:59 AM      Result Value Range Status   Specimen Description CATH TIP PICC LINE   Final   Special Requests NONE   Final   Culture     Final   Value: NO GROWTH 2 DAYS     Performed at [facility]   Report Status 04/15/2013 FINAL   Final  MRSA PCR SCREENING     Status: None   Collection Time    04/13/13  5:16 PM      Result Value Range Status   MRSA by PCR NEGATIVE  NEGATIVE Final   Comment:            The GeneXpert MRSA Assay (FDA     approved for NASAL specimens     only), is one component of a     comprehensive MRSA colonization      surveillance program. It is not     intended to diagnose MRSA     infection nor to guide or     monitor treatment for  MRSA infections.     Studies: Ct Abdomen Pelvis W Contrast  04/19/2013   CLINICAL DATA:  Abdominal pain. Post appendectomy and inguinal hernia repair.  EXAM: CT ABDOMEN AND PELVIS WITH CONTRAST  TECHNIQUE: Multidetector CT imaging of the abdomen and pelvis was performed using the standard protocol following bolus administration of intravenous contrast.  CONTRAST:  163m OMNIPAQUE IOHEXOL 300 MG/ML  SOLN  COMPARISON:  08/11/2012 CT.  FINDINGS: Gas and fluid-filled dilated small bowel loops to level of the right lower quadrant where there is narrowing of distal small bowel loops with mild circumferential thickening which may represent the presence of an adhesion/stricture. Although less likely, primary mass is not excluded. Ascites without evidence of free intraperitoneal air.  Small fatty containing right inguinal hernia.  Right lower lobe consolidation may represent infiltrate and/ atelectasis. Recommend followup until clearance. Elevated right hemidiaphragm. This was noted previously.  Minimal amount of fluid inferior aspect left major fissure.  Right central line tip caval atrial junction/proximal right atrium.  Heart size within normal limits. Prominent coronary artery calcifications.  Atherosclerotic type changes of the abdominal aorta with prominent calcified plaque and mild narrowing also involves the descending thoracic aorta, abdominal aortic branch vessels and iliac/femoral arteries. Prominent narrowing superior mesenteric artery and moderate narrowing celiac artery. 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  Prominent sigmoid and descending colon diverticulosis.  Interval lumbar spine surgery with fusion L1 through S1. No solid bony fusion noted at the L1-2, L2-3, L3-4 and L5-S1 level. Anterior slippage of L4. The right the L1 pedicle screw does not enter the pedicle, located laterally  with surrounding loosening. Loosening left L1 pedicle screws suspected. The left L1 pedicle screw partially enters the left aspect of the canal. L3 pedicle screws partially enter canal more notable on the right. Right L4 pedicle screw partially enters the canal. L5 pedicle screws noted along the lateral aspect of the pedicle traversing the lateral anterior cortex. S1 pedicle screws traverse the anterior cortex. Prominent calcified structure posterior to the S1 vertebral body greater to left of midline.  Bilateral renal lesions larger ones which are cysts whereas some of the small ones cannot be adequately characterize. Large right parapelvic cyst.  No calcified gallstone. No worrisome hepatic, splenic, pancreatic or adrenal lesion.  IMPRESSION: Small bowel obstruction with gas and fluid-filled dilated small bowel loops to level of the right lower quadrant where there is narrowing of distal small bowel loops with mild circumferential thickening which may represent the presence of an adhesion/stricture. Although less likely, primary mass is not excluded. Ascites.  Small fatty containing right inguinal hernia.  Prominent sigmoid and descending colon diverticulosis.  Right lower lobe consolidation may represent infiltrate and/ atelectasis.  Prominent coronary artery calcifications.  Atherosclerotic type changes of the abdominal aorta and aortic branch vessels as detailed above. 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  Interval lumbar spine surgery with fusion L1 through S1. Please see above discussion as bony fusion is not confirmed on the current exam and there may be loosening of pedicle screws.  Please see above.  These results will be called to the ordering clinician or representative by the Radiologist Assistant, and communication documented in the PACS Dashboard.   Electronically Signed   By: SChauncey CruelM.D.   On: 04/19/2013 10:03   Dg Abd Portable 1v  04/17/2013   CLINICAL DATA:  Abdominal pain  EXAM: PORTABLE  ABDOMEN - 1 VIEW  COMPARISON:  None.  FINDINGS: Postsurgical changes are noted in the lower lumbar spine. Scattered large  and small bowel gas is noted although no to obstructive changes are seen. No free air is noted. No abnormal mass or abnormal calcifications are seen.  IMPRESSION: Postoperative changes.  Scattered large and small bowel gas without obstructive change.   Electronically Signed   By: Inez Catalina M.D.   On: 04/17/2013 20:25    Scheduled Meds: . aspirin EC  81 mg Oral q morning - 10a  . ceFEPime (MAXIPIME) IV  2 g Intravenous Q8H  . cholecalciferol  1,000 Units Oral Daily  . colchicine  0.6 mg Oral BID  . docusate sodium  100 mg Oral BID  . feeding supplement (ENSURE COMPLETE)  237 mL Oral BID BM  . FLUoxetine  40 mg Oral BID  . fluticasone  2 spray Each Nare Daily  . gabapentin  300 mg Oral BID  . gabapentin  600 mg Oral BID  . losartan  25 mg Oral Daily  . methylPREDNISolone (SOLU-MEDROL) injection  20 mg Intravenous Daily  . pantoprazole (PROTONIX) IV  40 mg Intravenous Q12H  . simvastatin  40 mg Oral Daily  . sodium chloride  3 mL Intravenous Q12H  . tiotropium  18 mcg Inhalation Daily  . traZODone  100 mg Oral QHS   Continuous Infusions: . sodium chloride      Principal Problem:   Sepsis associated hypotension Active Problems:   HYPERTENSION   Rheumatoid arthritis(714.0)   Dehydration   Wound infection after surgery   Adrenal insufficiency   Chronic use of steroids   Protein-calorie malnutrition, severe    Time spent: [facility] Pager [phone]. If 7PM-7AM, please contact night-coverage at [email or web address], password [email or web address] 04/19/2013, 11:37 AM  LOS: 7 days

## 2013-04-20 ENCOUNTER — Inpatient Hospital Stay (HOSPITAL_COMMUNITY): Payer: Medicare Other

## 2013-04-20 DIAGNOSIS — E43 Unspecified severe protein-calorie malnutrition: Secondary | ICD-10-CM

## 2013-04-20 DIAGNOSIS — E871 Hypo-osmolality and hyponatremia: Secondary | ICD-10-CM

## 2013-04-20 MED ORDER — MENTHOL 3 MG MT LOZG
1.0000 | LOZENGE | OROMUCOSAL | Status: DC | PRN
  Filled 2013-04-20: qty 9

## 2013-04-20 MED ORDER — ASPIRIN 81 MG PO CHEW
81.0000 mg | CHEWABLE_TABLET | Freq: Every day | ORAL | Status: DC
  Administered 2013-04-20 – 2013-04-24 (×5): 81 mg
  Filled 2013-04-20 (×3): qty 1

## 2013-04-20 MED ORDER — COLCHICINE 0.6 MG PO TABS
0.6000 mg | ORAL_TABLET | Freq: Two times a day (BID) | ORAL | Status: DC
  Administered 2013-04-20 – 2013-04-24 (×9): 0.6 mg
  Filled 2013-04-20 (×10): qty 1

## 2013-04-20 MED ORDER — FLUOXETINE HCL 20 MG/5ML PO SOLN
40.0000 mg | Freq: Two times a day (BID) | ORAL | Status: DC
  Administered 2013-04-20 – 2013-04-21 (×3): 40 mg
  Filled 2013-04-20 (×4): qty 10

## 2013-04-20 MED ORDER — PHENOL 1.4 % MT LIQD
1.0000 | OROMUCOSAL | Status: DC | PRN
  Administered 2013-04-20 (×2): 1 via OROMUCOSAL
  Filled 2013-04-20 (×2): qty 177

## 2013-04-20 NOTE — Progress Notes (Signed)
Patient ID: [name], male   DOB: 1945-05-19, 68 y.o.   MRN: [id number] Abdomen better. flatus present.

## 2013-04-20 NOTE — Progress Notes (Signed)
Pt with improved exam and x rays.  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  This does not meet size to treat or embolize (1.5 cm).  This will need follow up with U/S in 6 months to monitor. This could be embolized if it gets bigger in the future or symptomatic. SBO seems better.

## 2013-04-20 NOTE — Progress Notes (Signed)
Patient ID: [name], male   DOB: 09/23/1945, 68 y.o.   MRN: [id number]    Subjective: Pt feels better today.  Passing flatus, no BM.  Objective: Vital signs in last 24 hours: Temp:  [97.4 F (36.3 C)-98.7 F (37.1 C)] 98.7 F (37.1 C) (01/13 0508) Pulse Rate:  [72-90] 72 (01/13 0508) Resp:  [18-20] 20 (01/13 0508) BP: (123-153)/(65-85) 153/65 mmHg (01/13 0508) SpO2:  [94 %-100 %] 100 % (01/13 0508) Last BM Date: 04/17/13  Intake/Output from previous day: 01/12 0701 - 01/13 0700 In: 480 [P.O.:480] Out: 1800 [Urine:1800] Intake/Output this shift:    PE: Abd: softer, less distended, less pain, NGT with minimal output currently, cannister appears to have just been changed. Heart: regular Lungs: CTAB  Lab Results:  No results found for this basename: WBC, HGB, HCT, PLT,  in the last 72 hours BMET  Recent Labs  04/18/13 0447  NA 137  K 3.7  CL 97  CO2 29  GLUCOSE 88  BUN 9  CREATININE 0.47*  CALCIUM 8.4   PT/INR No results found for this basename: LABPROT, INR,  in the last 72 hours CMP     Component Value Date/Time   NA 137 04/18/2013 0447   K 3.7 04/18/2013 0447   CL 97 04/18/2013 0447   CO2 29 04/18/2013 0447   GLUCOSE 88 04/18/2013 0447   BUN 9 04/18/2013 0447   CREATININE 0.47* 04/18/2013 0447   CALCIUM 8.4 04/18/2013 0447   PROT 6.5 04/12/2013 1610   ALBUMIN 2.9* 04/12/2013 1610   AST 16 04/12/2013 1610   ALT 11 04/12/2013 1610   ALKPHOS 121* 04/12/2013 1610   BILITOT 0.4 04/12/2013 1610   GFRNONAA >90 04/18/2013 0447   GFRAA >90 04/18/2013 0447   Lipase     Component Value Date/Time   LIPASE 28 04/18/2013 0447       Studies/Results: Ct Abdomen Pelvis W Contrast  04/19/2013   CLINICAL DATA:  Abdominal pain. Post appendectomy and inguinal hernia repair.  EXAM: CT ABDOMEN AND PELVIS WITH CONTRAST  TECHNIQUE: Multidetector CT imaging of the abdomen and pelvis was performed using the standard protocol following bolus administration of intravenous contrast.   CONTRAST:  OMNIPAQUE IOHEXOL 300 MG/ML  SOLN  COMPARISON:  08/11/2012 CT.  FINDINGS: Gas and fluid-filled dilated small bowel loops to level of the right lower quadrant where there is narrowing of distal small bowel loops with mild circumferential thickening which may represent the presence of an adhesion/stricture. Although less likely, primary mass is not excluded. Ascites without evidence of free intraperitoneal air.  Small fatty containing right inguinal hernia.  Right lower lobe consolidation may represent infiltrate and/ atelectasis. Recommend followup until clearance. Elevated right hemidiaphragm. This was noted previously.  Minimal amount of fluid inferior aspect left major fissure.  Right central line tip caval atrial junction/proximal right atrium.  Heart size within normal limits. Prominent coronary artery calcifications.  Atherosclerotic type changes of the abdominal aorta with prominent calcified plaque and mild narrowing also involves the descending thoracic aorta, abdominal aortic branch vessels and iliac/femoral arteries. Prominent narrowing superior mesenteric artery and moderate narrowing celiac artery. 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  Prominent sigmoid and descending colon diverticulosis.  Interval lumbar spine surgery with fusion L1 through S1. No solid bony fusion noted at the L1-2, L2-3, L3-4 and L5-S1 level. Anterior slippage of L4. The right the L1 pedicle screw does not enter the pedicle, located laterally with surrounding loosening. Loosening left L1 pedicle screws suspected. The  left L1 pedicle screw partially enters the left aspect of the canal. L3 pedicle screws partially enter canal more notable on the right. Right L4 pedicle screw partially enters the canal. L5 pedicle screws noted along the lateral aspect of the pedicle traversing the lateral anterior cortex. S1 pedicle screws traverse the anterior cortex. Prominent calcified structure posterior to the S1 vertebral body greater  to left of midline.  Bilateral renal lesions larger ones which are cysts whereas some of the small ones cannot be adequately characterize. Large right parapelvic cyst.  No calcified gallstone. No worrisome hepatic, splenic, pancreatic or adrenal lesion.  IMPRESSION: Small bowel obstruction with gas and fluid-filled dilated small bowel loops to level of the right lower quadrant where there is narrowing of distal small bowel loops with mild circumferential thickening which may represent the presence of an adhesion/stricture. Although less likely, primary mass is not excluded. Ascites.  Small fatty containing right inguinal hernia.  Prominent sigmoid and descending colon diverticulosis.  Right lower lobe consolidation may represent infiltrate and/ atelectasis.  Prominent coronary artery calcifications.  Atherosclerotic type changes of the abdominal aorta and aortic branch vessels as detailed above. 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  Interval lumbar spine surgery with fusion L1 through S1. Please see above discussion as bony fusion is not confirmed on the current exam and there may be loosening of pedicle screws.  Please see above.  These results will be called to the ordering clinician or representative by the Radiologist Assistant, and communication documented in the PACS Dashboard.   Electronically Signed   By: Bridgett Larsson M.D.   On: 04/19/2013 10:03   Dg Abd Portable 1v  04/19/2013   CLINICAL DATA:  NG tube placement.  EXAM: PORTABLE ABDOMEN - 1 VIEW  COMPARISON:  04/19/2013.  FINDINGS: Nasogastric tube tip gastric pylorus level.  Gas distended small bowel loops.  Please see recent CT report.  Degenerative changes thoracic spine on lumbar spine post fusion L1 through S1.  IMPRESSION: Nasogastric tube tip gastric pylorus level.  Gas distended small bowel loops.  Please see recent CT report.   Electronically Signed   By: Bridgett Larsson M.D.   On: 04/19/2013 17:21   Dg Abd Portable 1v  04/19/2013   CLINICAL DATA:   Nasogastric tube placement.  EXAM: PORTABLE ABDOMEN - 1 VIEW  COMPARISON:  04/19/2013.  FINDINGS: There is no nasogastric tube visualized. Telemetry leads are present over the abdomen. Gaseous distension of small bowel, with dilation in the right upper quadrant and pelvis. Postoperative changes of lumbar spine surgery.  IMPRESSION: No visible nasogastric tube on this abdominal radiograph. Consider chest radiograph to assess location or Re-insert and repeat abdominal film.   Electronically Signed   By: Andreas Newport M.D.   On: 04/19/2013 16:15    Anti-infectives: Anti-infectives   Start     Dose/Rate Route Frequency Ordered Stop   04/13/13 0015  ceFEPIme (MAXIPIME) 2 g in dextrose 5 % 50 mL IVPB     2 g 100 mL/hr over 30 Minutes Intravenous 3 times per day 04/13/13 0010         Assessment/Plan  1. PSBO Patient Active Problem List   Diagnosis Date Noted  . Protein-calorie malnutrition, severe 04/15/2013  . Sepsis associated hypotension 04/13/2013  . Wound infection after surgery 04/13/2013  . Adrenal insufficiency 04/13/2013  . Chronic use of steroids 04/13/2013  . Hypokalemia 03/18/2013  . Dehydration 03/17/2013  . Hyponatremia 03/17/2013  . GERD (gastroesophageal reflux disease) 03/17/2013  . Infection,  undetermined etiology 03/16/2013  . Lumbar degenerative disc disease 03/02/2013  . Lumbar herniated disc 03/02/2013  . Laxity of abdominal wall-right side 09/03/2012  . Vertigo 06/24/2012  . Acute confusional state 06/24/2012  . Ataxia 06/22/2012  . Anemia 06/22/2012  . PVC (premature ventricular contraction) 06/22/2012  . Tremor 06/22/2012  . Chronic pain 04/08/2012  . Routine health maintenance 04/08/2012  . Fracture of acromion of scapula 02/07/2012  . Painless hematuria 08/09/2011  . Other abnormal glucose 08/07/2011  . Obstructive chronic bronchitis with exacerbation 07/23/2011  . 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 Sinusitis, chronic 11/08/2010  . Cough 10/25/2010  .  Atrial fibrillation 08/07/2010  . VENOUS INSUFFICIENCY, LEGS 04/10/2009  . BACK PAIN, LUMBAR, WITH RADICULOPATHY 04/06/2009  . DEGENERATIVE DISC DISEASE, LUMBOSACRAL SPINE 10/26/2008  . Rheumatoid arthritis(714.0) 10/18/2008  . ANXIETY 11/09/2007  . PAIN IN SOFT TISSUES OF LIMB 04/17/2007  . DYSLIPIDEMIA 03/30/2007  . PSEUDOGOUT 03/30/2007  . HYPERTENSION 03/30/2007  . CAD 03/30/2007  . ALLERGIC RHINITIS 03/30/2007  . GENERALIZED OSTEOARTHROSIS UNSPECIFIED SITE 03/30/2007  . HEMOCHROMATOSIS, HX OF 03/30/2007  . COLONIC POLYPS, HX OF 03/30/2007  . DIVERTICULITIS, HX OF 03/30/2007   Plan: 1. Patient seems to be clinically improving with placement of NGT.  X-rays are not done yet today.  Will follow these and see how they look.  Cont conservative management at this time and hopefully avoid an operation.   LOS: 8 days    Alvester Eads E 04/20/2013, 7:36 AM Pager: [phone]

## 2013-04-20 NOTE — Progress Notes (Signed)
[facility] PROGRESS NOTE  [name] HBZ:[id number] DOB: 1945/07/29 DOA: 04/12/2013 PCP: John Hare, MD Brief narrative Pt is a 68 y.o. male with hx of RA on chronic steroids for several years, hx of HTN, COPD, posterior lumbar fusion, post op wound infection with pseudomonas, s/p debridement, on Cefepime via PICC line, presented to Dr [name] Lei Damn's office, found to have fever and hypotension, and sent to the ER for further work up and Tx. Picc was removed and tip cultured, BC obtianed, increase Cefepime to max allowable dose, give steroid and IVF, and hospitalist was asked to admit him.   Assessment/Plan: .  Hypotension -Unclear etiology, meds versus infection versus adrenal insufficiency -Adrenal insufficiency less likely as serum cortisols so far normal x2(noted that patient has been on chronic low-dose prednisone and blood pressures on admission responded well to steroids) -Status post PICC line removal -Catheter tip and blood cultures so far with no growth to date-continue cefepime -Appreciate ID input, patient to continue cefepime until January 24 -Resolved off antihypertensives, steroids and IV fluids, continue antibiotics  -BP elevated this a.m. Will begin tapering prednisone and resume Cozaar at decreased dose -Cultures remained with no growth to date, will order the replacement as recommended per ID.  [name] Rheumatoid arthritis(714.0) -pt now NPO so will change to solumedrol for now and follow . HYPERTENSION -Continue Cozaar at current dose, blood pressure stable the  . Dehydration hyponatremia -resume hydration with normal saline and follow . Chronic steroid use - tapering prednisone-he usually is on maintenance dose.   . Status post multilevel lumbar fusion with probable wound infection -Status post I&D on 12/11 -Appreciate neurosurgery input -Cultures still with no growth, status post PICC replacement -Continue pain management . Recent Pseudomonas urinary tract  infection-continue cefepime . Hoarseness -I consulted ENT on 1/8, appreciate Dr.Gore's input>> no vocal cord injury -Speech therapy consulted as recommended>> pt referred to [facility] outpatient speech therapy .SBO -CT abd 1/12 with sbo with ?adhesion vs mass -will keep NPO, NG, IVF continue pain management and follow -Resolving, + BM this am -Appreciate gen surgery assistance .RLE  infiltrate vs consolidation -pt assymptomatic>> no cough or SOB,more likely ATX -incentive spirometry and follow up with PCP  Code Status: Full Family Communication: wife updated on 1/12 Disposition Plan: to home when medically ready   Consultants:  ID  Neurosurgery  CCS  Procedures:  None  Antibiotics:  Cefepime to be continued through 1/23  HPI/Subjective: States he has increased abd pain last p.m. but better this a.m. after a bowel movement. Denies nausea vomiting. NG remains inplace Objective: Filed Vitals:   04/20/13 1100  BP: 137/91  Pulse:   Temp:   Resp:     Intake/Output Summary (Last 24 hours) at 04/20/13 1257 Last data filed at 04/20/13 1058  Gross per 24 hour  Intake    240 ml  Output   1801 ml  Net  -1561 ml   Filed Weights   04/12/13 1603  Weight: 62.143 kg (137 lb)    Exam:  General: alert & oriented x 3 In NAD NG in place  Cardiovascular: RRR, nl S1 s2 Respiratory: CTAB Abdomen: soft few BS,+tenderness,mild distension Extremities: No cyanosis and no edema    Data Reviewed: Basic Metabolic Panel:  Recent Labs Lab 04/15/13 0248 04/18/13 0447  NA 137 137  K 3.8 3.7  CL 100 97  CO2 28 29  GLUCOSE 120* 88  BUN 14 9  CREATININE 0.63 0.47*  CALCIUM 8.3* 8.4   Liver Function  Tests: No results found for this basename: AST, ALT, ALKPHOS, BILITOT, PROT, ALBUMIN,  in the last 168 hours  Recent Labs Lab 04/18/13 0447  LIPASE 28   No results found for this basename: AMMONIA,  in the last 168 hours CBC:  Recent Labs Lab 04/15/13 0248  WBC 6.4  HGB  10.1*  HCT 30.1*  MCV 88.0  PLT 336   Cardiac Enzymes: No results found for this basename: CKTOTAL, CKMB, CKMBINDEX, TROPONINI,  in the last 168 hours BNP (last 3 results) No results found for this basename: PROBNP,  in the last 8760 hours CBG: No results found for this basename: GLUCAP,  in the last 168 hours  Recent Results (from the past 240 hour(s))  CULTURE, BLOOD (ROUTINE X 2)     Status: None   Collection Time    04/12/13  9:40 PM      Result Value Range Status   Specimen Description BLOOD LEFT ARM   Final   Special Requests BOTTLES DRAWN AEROBIC AND ANAEROBIC 10CC EA   Final   Culture  Setup Time     Final   Value: 04/13/2013 03:58     Performed at [facility]   Culture     Final   Value: NO GROWTH 5 DAYS     Performed at [facility]   Report Status 04/19/2013 FINAL   Final  CULTURE, BLOOD (ROUTINE X 2)     Status: None   Collection Time    04/12/13  9:45 PM      Result Value Range Status   Specimen Description BLOOD LEFT HAND   Final   Special Requests BOTTLES DRAWN AEROBIC ONLY 5CC   Final   Culture  Setup Time     Final   Value: 04/13/2013 03:58     Performed at [facility]   Culture     Final   Value: NO GROWTH 5 DAYS     Performed at [facility]   Report Status 04/19/2013 FINAL   Final  URINE CULTURE     Status: None   Collection Time    04/12/13 11:35 PM      Result Value Range Status   Specimen Description URINE, CLEAN CATCH   Final   Special Requests NONE   Final   Culture  Setup Time     Final   Value: 04/13/2013 00:49     Performed at [place]     Final   Value: NO GROWTH     Performed at [facility]   Culture     Final   Value: NO GROWTH     Performed at [facility]   Report Status 04/14/2013 FINAL   Final  CATH TIP CULTURE     Status: None   Collection Time    04/13/13 12:59 AM      Result Value Range Status   Specimen Description CATH TIP PICC LINE   Final    Special Requests NONE   Final   Culture     Final   Value: NO GROWTH 2 DAYS     Performed at [facility]   Report Status 04/15/2013 FINAL   Final  MRSA PCR SCREENING     Status: None   Collection Time    04/13/13  5:16 PM      Result Value Range Status   MRSA by PCR NEGATIVE  NEGATIVE Final   Comment:  The GeneXpert MRSA Assay (FDA     approved for NASAL specimens     only), is one component of a     comprehensive MRSA colonization     surveillance program. It is not     intended to diagnose MRSA     infection nor to guide or     monitor treatment for     MRSA infections.     Studies: Ct Abdomen Pelvis W Contrast  04/19/2013   CLINICAL DATA:  Abdominal pain. Post appendectomy and inguinal hernia repair.  EXAM: CT ABDOMEN AND PELVIS WITH CONTRAST  TECHNIQUE: Multidetector CT imaging of the abdomen and pelvis was performed using the standard protocol following bolus administration of intravenous contrast.  CONTRAST:  159m OMNIPAQUE IOHEXOL 300 MG/ML  SOLN  COMPARISON:  08/11/2012 CT.  FINDINGS: Gas and fluid-filled dilated small bowel loops to level of the right lower quadrant where there is narrowing of distal small bowel loops with mild circumferential thickening which may represent the presence of an adhesion/stricture. Although less likely, primary mass is not excluded. Ascites without evidence of free intraperitoneal air.  Small fatty containing right inguinal hernia.  Right lower lobe consolidation may represent infiltrate and/ atelectasis. Recommend followup until clearance. Elevated right hemidiaphragm. This was noted previously.  Minimal amount of fluid inferior aspect left major fissure.  Right central line tip caval atrial junction/proximal right atrium.  Heart size within normal limits. Prominent coronary artery calcifications.  Atherosclerotic type changes of the abdominal aorta with prominent calcified plaque and mild narrowing also involves the descending  thoracic aorta, abdominal aortic branch vessels and iliac/femoral arteries. Prominent narrowing superior mesenteric artery and moderate narrowing celiac artery. 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  Prominent sigmoid and descending colon diverticulosis.  Interval lumbar spine surgery with fusion L1 through S1. No solid bony fusion noted at the L1-2, L2-3, L3-4 and L5-S1 level. Anterior slippage of L4. The right the L1 pedicle screw does not enter the pedicle, located laterally with surrounding loosening. Loosening left L1 pedicle screws suspected. The left L1 pedicle screw partially enters the left aspect of the canal. L3 pedicle screws partially enter canal more notable on the right. Right L4 pedicle screw partially enters the canal. L5 pedicle screws noted along the lateral aspect of the pedicle traversing the lateral anterior cortex. S1 pedicle screws traverse the anterior cortex. Prominent calcified structure posterior to the S1 vertebral body greater to left of midline.  Bilateral renal lesions larger ones which are cysts whereas some of the small ones cannot be adequately characterize. Large right parapelvic cyst.  No calcified gallstone. No worrisome hepatic, splenic, pancreatic or adrenal lesion.  IMPRESSION: Small bowel obstruction with gas and fluid-filled dilated small bowel loops to level of the right lower quadrant where there is narrowing of distal small bowel loops with mild circumferential thickening which may represent the presence of an adhesion/stricture. Although less likely, primary mass is not excluded. Ascites.  Small fatty containing right inguinal hernia.  Prominent sigmoid and descending colon diverticulosis.  Right lower lobe consolidation may represent infiltrate and/ atelectasis.  Prominent coronary artery calcifications.  Atherosclerotic type changes of the abdominal aorta and aortic branch vessels as detailed above. 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  Interval lumbar spine surgery with fusion  L1 through S1. Please see above discussion as bony fusion is not confirmed on the current exam and there may be loosening of pedicle screws.  Please see above.  These results will be called to the ordering clinician or  representative by the Radiologist Assistant, and communication documented in the PACS Dashboard.   Electronically Signed   By: Chauncey Cruel M.D.   On: 04/19/2013 10:03   Dg Abd 2 Views  04/20/2013   CLINICAL DATA:  Small-bowel obstruction  EXAM: ABDOMEN - 2 VIEW  COMPARISON:  04/20/2011  FINDINGS: Gastric tube extends to the stomach into the duodenum.  Distended small bowel loops in the right abdomen with air-fluid levels similar to prior studies and suspicious for partial small bowel obstruction. Colon is decompressed.  Elevated right hemidiaphragm. The right diaphragm was not included on the upright view, therefore the study does not evaluate for free intraperitoneal air.  Lumbar fusion with loosening of the L1 is pedicle screw on the left.  IMPRESSION: Dilated small bowel with air-fluid levels consistent with partial small bowel obstruction. NG tube in the duodenum.   Electronically Signed   By: Franchot Gallo M.D.   On: 04/20/2013 08:31   Dg Abd Portable 1v  04/19/2013   CLINICAL DATA:  NG tube placement.  EXAM: PORTABLE ABDOMEN - 1 VIEW  COMPARISON:  04/19/2013.  FINDINGS: Nasogastric tube tip gastric pylorus level.  Gas distended small bowel loops.  Please see recent CT report.  Degenerative changes thoracic spine on lumbar spine post fusion L1 through S1.  IMPRESSION: Nasogastric tube tip gastric pylorus level.  Gas distended small bowel loops.  Please see recent CT report.   Electronically Signed   By: Chauncey Cruel M.D.   On: 04/19/2013 17:21   Dg Abd Portable 1v  04/19/2013   CLINICAL DATA:  Nasogastric tube placement.  EXAM: PORTABLE ABDOMEN - 1 VIEW  COMPARISON:  04/19/2013.  FINDINGS: There is no nasogastric tube visualized. Telemetry leads are present over the abdomen. Gaseous  distension of small bowel, with dilation in the right upper quadrant and pelvis. Postoperative changes of lumbar spine surgery.  IMPRESSION: No visible nasogastric tube on this abdominal radiograph. Consider chest radiograph to assess location or Re-insert and repeat abdominal film.   Electronically Signed   By: Dereck Ligas M.D.   On: 04/19/2013 16:15    Scheduled Meds: . aspirin  81 mg Per Tube Daily  . ceFEPime (MAXIPIME) IV  2 g Intravenous Q8H  . cholecalciferol  1,000 Units Oral Daily  . colchicine  0.6 mg Per Tube BID  . docusate  100 mg Per Tube BID  . feeding supplement (ENSURE COMPLETE)  237 mL Oral BID BM  . FLUoxetine  40 mg Per Tube BID  . fluticasone  2 spray Each Nare Daily  . gabapentin  300 mg Per Tube Custom  . gabapentin  600 mg Per Tube Q12H  . losartan  25 mg Oral Daily  . methylPREDNISolone (SOLU-MEDROL) injection  20 mg Intravenous Daily  . pantoprazole (PROTONIX) IV  40 mg Intravenous Q12H  . simvastatin  40 mg Oral Daily  . sodium chloride  3 mL Intravenous Q12H  . tiotropium  18 mcg Inhalation Daily  . traZODone  100 mg Oral QHS   Continuous Infusions: . sodium chloride 75 mL/hr at 04/20/13 1610    Principal Problem:   Sepsis associated hypotension Active Problems:   HYPERTENSION   Rheumatoid arthritis(714.0)   Dehydration   Wound infection after surgery   Adrenal insufficiency   Chronic use of steroids   Protein-calorie malnutrition, severe    Time spent: [facility] Pager [phone]. If 7PM-7AM, please contact night-coverage at [email or web address], password [facility] 04/20/2013, 12:57  PM  LOS: 8 days

## 2013-04-21 ENCOUNTER — Inpatient Hospital Stay (HOSPITAL_COMMUNITY): Payer: Medicare Other

## 2013-04-21 DIAGNOSIS — T8140XA Infection following a procedure, unspecified, initial encounter: Principal | ICD-10-CM

## 2013-04-21 LAB — BASIC METABOLIC PANEL
BUN: 7 mg/dL (ref 6–23)
CO2: 23 mEq/L (ref 19–32)
Calcium: 8.1 mg/dL — ABNORMAL LOW (ref 8.4–10.5)
Chloride: 97 mEq/L (ref 96–112)
Creatinine, Ser: 0.48 mg/dL — ABNORMAL LOW (ref 0.50–1.35)
GFR calc Af Amer: >90 mL/min (ref >90)
GFR calc non Af Amer: >90 mL/min (ref >90)
Glucose, Bld: 257 mg/dL — ABNORMAL HIGH (ref 70–99)
Potassium: 3.8 mEq/L (ref 3.7–5.3)
Sodium: 133 mEq/L — ABNORMAL LOW (ref 137–147)

## 2013-04-21 LAB — GLUCOSE, CAPILLARY
Glucose-Capillary: 217 mg/dL — ABNORMAL HIGH (ref 70–99)
Glucose-Capillary: 212 mg/dL — ABNORMAL HIGH (ref 70–99)

## 2013-04-21 MED ORDER — ENSURE COMPLETE PO LIQD
237.0000 mL | Freq: Every day | ORAL | Status: DC
  Administered 2013-04-22 – 2013-04-24 (×3): 237 mL via ORAL

## 2013-04-21 MED ORDER — DOCUSATE SODIUM 100 MG PO CAPS
100.0000 mg | ORAL_CAPSULE | Freq: Two times a day (BID) | ORAL | Status: DC
  Administered 2013-04-21 – 2013-04-23 (×2): 100 mg via ORAL
  Filled 2013-04-21 (×6): qty 1

## 2013-04-21 MED ORDER — FLUOXETINE HCL 20 MG PO CAPS
40.0000 mg | ORAL_CAPSULE | Freq: Two times a day (BID) | ORAL | Status: DC
  Administered 2013-04-21 – 2013-04-24 (×6): 40 mg via ORAL
  Filled 2013-04-21 (×7): qty 2

## 2013-04-21 MED ORDER — INSULIN ASPART 100 UNIT/ML ~~LOC~~ SOLN: 0.0000 [IU] | Freq: Three times a day (TID) | SUBCUTANEOUS | Status: DC

## 2013-04-21 MED ORDER — BOOST / RESOURCE BREEZE PO LIQD
1.0000 | Freq: Every day | ORAL | Status: DC
  Administered 2013-04-21 – 2013-04-23 (×3): 1 via ORAL

## 2013-04-21 MED ORDER — DOCUSATE SODIUM 100 MG PO CAPS: 100.0000 mg | ORAL_CAPSULE | Freq: Every day | ORAL | Status: DC | PRN

## 2013-04-21 MED ORDER — GABAPENTIN 300 MG PO CAPS
300.0000 mg | ORAL_CAPSULE | ORAL | Status: DC
  Administered 2013-04-21 – 2013-04-24 (×7): 300 mg via ORAL
  Filled 2013-04-21 (×9): qty 1

## 2013-04-21 MED ORDER — INSULIN ASPART 100 UNIT/ML ~~LOC~~ SOLN
0.0000 [IU] | Freq: Three times a day (TID) | SUBCUTANEOUS | Status: DC
  Administered 2013-04-21: 3 [IU] via SUBCUTANEOUS
  Administered 2013-04-22: 2 [IU] via SUBCUTANEOUS

## 2013-04-21 MED ORDER — GABAPENTIN 600 MG PO TABS
600.0000 mg | ORAL_TABLET | Freq: Two times a day (BID) | ORAL | Status: DC
  Administered 2013-04-21 – 2013-04-24 (×5): 600 mg via ORAL
  Filled 2013-04-21 (×9): qty 1

## 2013-04-21 MED ORDER — PANTOPRAZOLE SODIUM 40 MG PO TBEC
40.0000 mg | DELAYED_RELEASE_TABLET | Freq: Two times a day (BID) | ORAL | Status: DC
  Administered 2013-04-21 – 2013-04-24 (×6): 40 mg via ORAL
  Filled 2013-04-21 (×5): qty 1

## 2013-04-21 NOTE — Progress Notes (Signed)
I agree with the Student-Dietitian note and made appropriate revisions.  Katie Naliya Gish, RD, LDN Pager #: [phone] After-Hours Pager #: [phone]  

## 2013-04-21 NOTE — Progress Notes (Signed)
Subjective: Pt has no abdominal pain.  Back hurts.  Bowels moving.   Objective: Vital signs in last 24 hours: Temp:  [97.6 F (36.4 C)-98.4 F (36.9 C)] 97.8 F (36.6 C) (01/14 0418) Pulse Rate:  [80-84] 84 (01/14 0418) Resp:  [18] 18 (01/14 0418) BP: (133-139)/(77-91) 133/77 mmHg (01/14 0418) SpO2:  [94 %-98 %] 98 % (01/14 0418) Last BM Date: 04/20/13  Intake/Output from previous day: 01/13 0701 - 01/14 0700 In: 900 [NG/GT:900] Out: 2002 [Urine:2000; Stool:2] Intake/Output this shift:    GI: soft, non-tender; bowel sounds normal; no masses,  no organomegaly  Lab Results:  No results found for this basename: WBC, HGB, HCT, PLT,  in the last 72 hours BMET No results found for this basename: NA, K, CL, CO2, GLUCOSE, BUN, CREATININE, CALCIUM,  in the last 72 hours PT/INR No results found for this basename: LABPROT, INR,  in the last 72 hours ABG No results found for this basename: PHART, PCO2, PO2, HCO3,  in the last 72 hours  Studies/Results: Ct Abdomen Pelvis W Contrast  04/19/2013   CLINICAL DATA:  Abdominal pain. Post appendectomy and inguinal hernia repair.  EXAM: CT ABDOMEN AND PELVIS WITH CONTRAST  TECHNIQUE: Multidetector CT imaging of the abdomen and pelvis was performed using the standard protocol following bolus administration of intravenous contrast.  CONTRAST:  143m OMNIPAQUE IOHEXOL 300 MG/ML  SOLN  COMPARISON:  08/11/2012 CT.  FINDINGS: Gas and fluid-filled dilated small bowel loops to level of the right lower quadrant where there is narrowing of distal small bowel loops with mild circumferential thickening which may represent the presence of an adhesion/stricture. Although less likely, primary mass is not excluded. Ascites without evidence of free intraperitoneal air.  Small fatty containing right inguinal hernia.  Right lower lobe consolidation may represent infiltrate and/ atelectasis. Recommend followup until clearance. Elevated right hemidiaphragm. This was  noted previously.  Minimal amount of fluid inferior aspect left major fissure.  Right central line tip caval atrial junction/proximal right atrium.  Heart size within normal limits. Prominent coronary artery calcifications.  Atherosclerotic type changes of the abdominal aorta with prominent calcified plaque and mild narrowing also involves the descending thoracic aorta, abdominal aortic branch vessels and iliac/femoral arteries. Prominent narrowing superior mesenteric artery and moderate narrowing celiac artery. 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  Prominent sigmoid and descending colon diverticulosis.  Interval lumbar spine surgery with fusion L1 through S1. No solid bony fusion noted at the L1-2, L2-3, L3-4 and L5-S1 level. Anterior slippage of L4. The right the L1 pedicle screw does not enter the pedicle, located laterally with surrounding loosening. Loosening left L1 pedicle screws suspected. The left L1 pedicle screw partially enters the left aspect of the canal. L3 pedicle screws partially enter canal more notable on the right. Right L4 pedicle screw partially enters the canal. L5 pedicle screws noted along the lateral aspect of the pedicle traversing the lateral anterior cortex. S1 pedicle screws traverse the anterior cortex. Prominent calcified structure posterior to the S1 vertebral body greater to left of midline.  Bilateral renal lesions larger ones which are cysts whereas some of the small ones cannot be adequately characterize. Large right parapelvic cyst.  No calcified gallstone. No worrisome hepatic, splenic, pancreatic or adrenal lesion.  IMPRESSION: Small bowel obstruction with gas and fluid-filled dilated small bowel loops to level of the right lower quadrant where there is narrowing of distal small bowel loops with mild circumferential thickening which may represent the presence of an adhesion/stricture. Although less likely,  primary mass is not excluded. Ascites.  Small fatty containing right  inguinal hernia.  Prominent sigmoid and descending colon diverticulosis.  Right lower lobe consolidation may represent infiltrate and/ atelectasis.  Prominent coronary artery calcifications.  Atherosclerotic type changes of the abdominal aorta and aortic branch vessels as detailed above. 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  Interval lumbar spine surgery with fusion L1 through S1. Please see above discussion as bony fusion is not confirmed on the current exam and there may be loosening of pedicle screws.  Please see above.  These results will be called to the ordering clinician or representative by the Radiologist Assistant, and communication documented in the PACS Dashboard.   Electronically Signed   By: Chauncey Cruel M.D.   On: 04/19/2013 10:03   Dg Abd 2 Views  04/20/2013   CLINICAL DATA:  Small-bowel obstruction  EXAM: ABDOMEN - 2 VIEW  COMPARISON:  04/20/2011  FINDINGS: Gastric tube extends to the stomach into the duodenum.  Distended small bowel loops in the right abdomen with air-fluid levels similar to prior studies and suspicious for partial small bowel obstruction. Colon is decompressed.  Elevated right hemidiaphragm. The right diaphragm was not included on the upright view, therefore the study does not evaluate for free intraperitoneal air.  Lumbar fusion with loosening of the L1 is pedicle screw on the left.  IMPRESSION: Dilated small bowel with air-fluid levels consistent with partial small bowel obstruction. NG tube in the duodenum.   Electronically Signed   By: Franchot Gallo M.D.   On: 04/20/2013 08:31   Dg Abd Portable 1v  04/19/2013   CLINICAL DATA:  NG tube placement.  EXAM: PORTABLE ABDOMEN - 1 VIEW  COMPARISON:  04/19/2013.  FINDINGS: Nasogastric tube tip gastric pylorus level.  Gas distended small bowel loops.  Please see recent CT report.  Degenerative changes thoracic spine on lumbar spine post fusion L1 through S1.  IMPRESSION: Nasogastric tube tip gastric pylorus level.  Gas distended small  bowel loops.  Please see recent CT report.   Electronically Signed   By: Chauncey Cruel M.D.   On: 04/19/2013 17:21   Dg Abd Portable 1v  04/19/2013   CLINICAL DATA:  Nasogastric tube placement.  EXAM: PORTABLE ABDOMEN - 1 VIEW  COMPARISON:  04/19/2013.  FINDINGS: There is no nasogastric tube visualized. Telemetry leads are present over the abdomen. Gaseous distension of small bowel, with dilation in the right upper quadrant and pelvis. Postoperative changes of lumbar spine surgery.  IMPRESSION: No visible nasogastric tube on this abdominal radiograph. Consider chest radiograph to assess location or Re-insert and repeat abdominal film.   Electronically Signed   By: Dereck Ligas M.D.   On: 04/19/2013 16:15    Anti-infectives: Anti-infectives   Start     Dose/Rate Route Frequency Ordered Stop   04/13/13 0015  ceFEPIme (MAXIPIME) 2 g in dextrose 5 % 50 mL IVPB     2 g 100 mL/hr over 30 Minutes Intravenous 3 times per day 04/13/13 0010        Assessment/Plan: p SBO improving      D/C NGT and try clears.  Check KUB in am Splenic artery aneurysm on CT 1.2 cm .Incidential   Requires no treatment but will need U/S in 6 months to ensure stability.  If size greater than 1.5 cm  Will need IR to embolize.  Discussed with pt so he is aware.   LOS: 9 days    Eaton Folmar A. 04/21/2013

## 2013-04-21 NOTE — Progress Notes (Signed)
[facility] PROGRESS NOTE  [name] KLK:[id number] DOB: 1945-10-19 DOA: 04/12/2013  PCP: Adella Hare, MD  Brief narrative Pt is a 68 y.o. male with hx of RA on chronic steroids for several years, hx of HTN, COPD, posterior lumbar fusion, post op lumbar wound infection with pseudomonas, s/p debridement, on Cefepime via PICC line, presented to Dr Derek Mound office, found to have fever and hypotension, and sent to the ER for further work up and Tx. Picc was removed and tip cultured, BC obtianed, increase Cefepime to max allowable dose, give steroid and IVF, and hospitalist was asked to admit him.   Assessment/Plan: .Hypotension/Sepsis -Now resolved. -Adrenal insufficiency less likely as serum cortisols so far normal x2(noted that patient has been on chronic low-dose prednisone and blood pressures on admission responded well to steroids) -Status post PICC line removal -Catheter tip and blood cultures so far with no growth to date-continue cefepime -Appreciate ID input, patient to continue cefepime until January 24 -BP elevated. Cozaar resumed.  -Cultures remained with no growth to date, PICC has been placed again.   Marland KitchenSBO -CT abd 1/12 with sbo with ?adhesion vs mass -NG removed by surgery. On liquids. Not passing flatus as yet. No BM. Monitor. Management per gen surgery.   .Status post multilevel lumbar fusion with probable wound infection -Status post I&D on 12/11 -Being followed by neurosurgery -Cultures still with no growth -Continue pain management  .Recent Pseudomonas urinary tract infection -continue cefepime  .Rheumatoid arthritis(714.0) -Contnue solumedrol for now and follow  .HYPERTENSION -Continue Cozaar at current dose, blood pressure stable  .Dehydration/Hyponatremia -Improved with   . Chronic steroid use - For RA.   [name] Hoarseness -Seen by ENT on 1/8. Appreciate Dr.Gore's input>> no vocal cord injury. -Speech therapy consulted as recommended>> pt  referred to [facility] outpatient speech therapy.  Marland KitchenRLE  infiltrate vs consolidation -pt assymptomatic>> no cough or SOB, more likely ATX. -incentive spirometry  Code Status: Full Code DVT Prophylaxis: SCD's Family Communication: Discussed with patient.  Disposition Plan: To home when medically ready. PT/OT to see.   Consultants:  ID  Neurosurgery  CCS  Procedures:  None  Antibiotics:  Cefepime to be continued through 1/23  HPI/Subjective: Patient feels better. Not passing flatus yet. No nausea or vomiting. No shortness of breath. Back pain is better. He wants to get up and walk.  Objective: Filed Vitals:   04/21/13 0418  BP: 133/77  Pulse: 84  Temp: 97.8 F (36.6 C)  Resp: 18    Intake/Output Summary (Last 24 hours) at 04/21/13 1358 Last data filed at 04/21/13 0900  Gross per 24 hour  Intake    900 ml  Output   1601 ml  Net   -701 ml   Filed Weights   04/12/13 1603  Weight: 62.143 kg (137 lb)    Exam:  General: alert & oriented x 3 In NAD NG in place  Cardiovascular: RRR, nl S1 s2 Respiratory: CTAB Abdomen: soft few BS, epig tenderness, mild distension. Good BS. Extremities: No cyanosis and no edema    Data Reviewed: Basic Metabolic Panel:  Recent Labs Lab 04/15/13 0248 04/18/13 0447  NA 137 137  K 3.8 3.7  CL 100 97  CO2 28 29  GLUCOSE 120* 88  BUN 14 9  CREATININE 0.63 0.47*  CALCIUM 8.3* 8.4    Recent Labs Lab 04/18/13 0447  LIPASE 28   CBC:  Recent Labs Lab 04/15/13 0248  WBC 6.4  HGB 10.1*  HCT 30.1*  MCV  88.0  PLT 336     Recent Results (from the past 240 hour(s))  CULTURE, BLOOD (ROUTINE X 2)     Status: None   Collection Time    04/12/13  9:40 PM      Result Value Range Status   Specimen Description BLOOD LEFT ARM   Final   Special Requests BOTTLES DRAWN AEROBIC AND ANAEROBIC 10CC EA   Final   Culture  Setup Time     Final   Value: 04/13/2013 03:58     Performed at [facility]   Culture     Final    Value: NO GROWTH 5 DAYS     Performed at [facility]   Report Status 04/19/2013 FINAL   Final  CULTURE, BLOOD (ROUTINE X 2)     Status: None   Collection Time    04/12/13  9:45 PM      Result Value Range Status   Specimen Description BLOOD LEFT HAND   Final   Special Requests BOTTLES DRAWN AEROBIC ONLY 5CC   Final   Culture  Setup Time     Final   Value: 04/13/2013 03:58     Performed at [facility]   Culture     Final   Value: NO GROWTH 5 DAYS     Performed at [facility]   Report Status 04/19/2013 FINAL   Final  URINE CULTURE     Status: None   Collection Time    04/12/13 11:35 PM      Result Value Range Status   Specimen Description URINE, CLEAN CATCH   Final   Special Requests NONE   Final   Culture  Setup Time     Final   Value: 04/13/2013 00:49     Performed at [place]     Final   Value: NO GROWTH     Performed at [facility]   Culture     Final   Value: NO GROWTH     Performed at [facility]   Report Status 04/14/2013 FINAL   Final  CATH TIP CULTURE     Status: None   Collection Time    04/13/13 12:59 AM      Result Value Range Status   Specimen Description CATH TIP PICC LINE   Final   Special Requests NONE   Final   Culture     Final   Value: NO GROWTH 2 DAYS     Performed at [facility]   Report Status 04/15/2013 FINAL   Final  MRSA PCR SCREENING     Status: None   Collection Time    04/13/13  5:16 PM      Result Value Range Status   MRSA by PCR NEGATIVE  NEGATIVE Final   Comment:            The GeneXpert MRSA Assay (FDA     approved for NASAL specimens     only), is one component of a     comprehensive MRSA colonization     surveillance program. It is not     intended to diagnose MRSA     infection nor to guide or     monitor treatment for     MRSA infections.     Studies: Dg Abd 2 Views  04/21/2013   CLINICAL DATA:  Small bowel obstruction  EXAM: ABDOMEN - 2 VIEW   COMPARISON:  DG  ABD 2 VIEWS dated 04/20/2013  FINDINGS: NG tube in the stomach. There are no dilated loops of large or small bowel. No fluid levels. There is gas the rectum. Posterior lumbar fusion noted.  IMPRESSION: Improvement in small bowel obstruction pattern.   tube in stomach   Electronically Signed   By: Suzy Bouchard M.D.   On: 04/21/2013 07:51   Dg Abd 2 Views  04/20/2013   CLINICAL DATA:  Small-bowel obstruction  EXAM: ABDOMEN - 2 VIEW  COMPARISON:  04/20/2011  FINDINGS: Gastric tube extends to the stomach into the duodenum.  Distended small bowel loops in the right abdomen with air-fluid levels similar to prior studies and suspicious for partial small bowel obstruction. Colon is decompressed.  Elevated right hemidiaphragm. The right diaphragm was not included on the upright view, therefore the study does not evaluate for free intraperitoneal air.  Lumbar fusion with loosening of the L1 is pedicle screw on the left.  IMPRESSION: Dilated small bowel with air-fluid levels consistent with partial small bowel obstruction. NG tube in the duodenum.   Electronically Signed   By: Franchot Gallo M.D.   On: 04/20/2013 08:31   Dg Abd Portable 1v  04/19/2013   CLINICAL DATA:  NG tube placement.  EXAM: PORTABLE ABDOMEN - 1 VIEW  COMPARISON:  04/19/2013.  FINDINGS: Nasogastric tube tip gastric pylorus level.  Gas distended small bowel loops.  Please see recent CT report.  Degenerative changes thoracic spine on lumbar spine post fusion L1 through S1.  IMPRESSION: Nasogastric tube tip gastric pylorus level.  Gas distended small bowel loops.  Please see recent CT report.   Electronically Signed   By: Chauncey Cruel M.D.   On: 04/19/2013 17:21   Dg Abd Portable 1v  04/19/2013   CLINICAL DATA:  Nasogastric tube placement.  EXAM: PORTABLE ABDOMEN - 1 VIEW  COMPARISON:  04/19/2013.  FINDINGS: There is no nasogastric tube visualized. Telemetry leads are present over the abdomen. Gaseous distension of small bowel, with  dilation in the right upper quadrant and pelvis. Postoperative changes of lumbar spine surgery.  IMPRESSION: No visible nasogastric tube on this abdominal radiograph. Consider chest radiograph to assess location or Re-insert and repeat abdominal film.   Electronically Signed   By: Dereck Ligas M.D.   On: 04/19/2013 16:15    Scheduled Meds: . aspirin  81 mg Per Tube Daily  . ceFEPime (MAXIPIME) IV  2 g Intravenous Q8H  . cholecalciferol  1,000 Units Oral Daily  . colchicine  0.6 mg Per Tube BID  . docusate sodium  100 mg Oral BID  . feeding supplement (ENSURE COMPLETE)  237 mL Oral BID BM  . FLUoxetine  40 mg Oral BID  . fluticasone  2 spray Each Nare Daily  . gabapentin  300 mg Oral Custom  . gabapentin  600 mg Oral BID AC & HS  . losartan  25 mg Oral Daily  . methylPREDNISolone (SOLU-MEDROL) injection  20 mg Intravenous Daily  . pantoprazole  40 mg Oral BID  . simvastatin  40 mg Oral Daily  . sodium chloride  3 mL Intravenous Q12H  . tiotropium  18 mcg Inhalation Daily  . traZODone  100 mg Oral QHS   Continuous Infusions: . sodium chloride 75 mL/hr at 04/20/13 1949    Principal Problem:   Sepsis associated hypotension Active Problems:   HYPERTENSION   Rheumatoid arthritis(714.0)   Dehydration   Wound infection after surgery   Adrenal insufficiency   Chronic use of  steroids   Protein-calorie malnutrition, severe    [facility]  [facility] Pager [phone].   If 7PM-7AM, please contact night-coverage at [email or web address], password [facility] 04/21/2013, 1:58 PM  LOS: 9 days

## 2013-04-21 NOTE — Progress Notes (Signed)
NUTRITION FOLLOW UP  DOCUMENTATION CODES  Per approved criteria   -Severe malnutrition in the context of acute illness or injury    Intervention:   1.  Continue Ensure Complete daily, supplement provides 350 kcals and 13 grams protein per 8 fl oz.   2.  Resource Breeze daily, supplement provides 250 kcals and 9 grams protein per 8 fl oz.   Nutrition Dx:   Increased nutrient needs related to malnutrition, repletion as evidenced by estimated nutrition needs, ongoing  Goal:   Pt to meet >/= 90% of their estimated nutrition needs, progressing   Monitor:   PO & supplemental intake, weight, labs, I/O's  Assessment:   Patient with PMH of HTN, COPD, posterior lumbar fusion, post op wound infection with pseudomonas, s/p debridement; found to have fever and hypotension; sent to the ER for further work up.   Patient presented with nausea, vomiting and constipation on 1/12. CT scan revealed small bowel obstruction. Patient placed on a clear liquid diet, but was drinking Ensure Complete upon Dietetic Intern visit. Patient reported having a very poor appetite over the past several days except for the "1 or 2 good days" where he was able to consume 100% of meals.   Per flow sheet records, PO intake is variable between 0-100%. Patient reports that drinking Ensure before dinner "kills his appetite" and he is unable to eat dinner. Patient stated that he would like to try Resource Breeze to see if it would "be lighter on his stomach."  Dietetic Intern changed scheduled afternoon Ensure Complete to a [employer].    Height: Ht Readings from Last 1 Encounters:  04/13/13 5' 6"  (1.676 m)    Weight Status:   Wt Readings from Last 1 Encounters:  04/12/13 137 lb (62.143 kg)    Re-estimated needs:  Kcal: 1850-2050  Protein: 95-105 gm  Fluid: 1.8-2.0 L  Skin: Incision on back  Diet Order: Clear Liquid   Intake/Output Summary (Last 24 hours) at 04/21/13 1426 Last data filed at 04/21/13  1300  Gross per 24 hour  Intake   1140 ml  Output   1951 ml  Net   -811 ml    Last BM:  1/13   Labs:   Recent Labs Lab 04/15/13 0248 04/18/13 0447  NA 137 137  K 3.8 3.7  CL 100 97  CO2 28 29  BUN 14 9  CREATININE 0.63 0.47*  CALCIUM 8.3* 8.4  GLUCOSE 120* 88    CBG (last 3)  No results found for this basename: GLUCAP,  in the last 72 hours  Scheduled Meds: . aspirin  81 mg Per Tube Daily  . ceFEPime (MAXIPIME) IV  2 g Intravenous Q8H  . cholecalciferol  1,000 Units Oral Daily  . colchicine  0.6 mg Per Tube BID  . docusate sodium  100 mg Oral BID  . feeding supplement (ENSURE COMPLETE)  237 mL Oral BID BM  . FLUoxetine  40 mg Oral BID  . fluticasone  2 spray Each Nare Daily  . gabapentin  300 mg Oral Custom  . gabapentin  600 mg Oral BID AC & HS  . losartan  25 mg Oral Daily  . methylPREDNISolone (SOLU-MEDROL) injection  20 mg Intravenous Daily  . pantoprazole  40 mg Oral BID  . simvastatin  40 mg Oral Daily  . sodium chloride  3 mL Intravenous Q12H  . tiotropium  18 mcg Inhalation Daily  . traZODone  100 mg Oral QHS    Continuous  Infusions: . sodium chloride 75 mL/hr at 04/20/13 1949    Claudell Kyle, Dietetic Intern Pager: [phone]

## 2013-04-22 ENCOUNTER — Inpatient Hospital Stay (HOSPITAL_COMMUNITY): Payer: Medicare Other

## 2013-04-22 DIAGNOSIS — E876 Hypokalemia: Secondary | ICD-10-CM

## 2013-04-22 LAB — CBC
HCT: 30.1 % — ABNORMAL LOW (ref 39.0–52.0)
Hemoglobin: 9.8 g/dL — ABNORMAL LOW (ref 13.0–17.0)
MCH: 29 pg (ref 26.0–34.0)
MCHC: 32.6 g/dL (ref 30.0–36.0)
MCV: 89.1 fL (ref 78.0–100.0)
Platelets: 239 10*3/uL (ref 150–400)
RBC: 3.38 MIL/uL — ABNORMAL LOW (ref 4.22–5.81)
RDW: 14.5 % (ref 11.5–15.5)
WBC: 7.4 10*3/uL (ref 4.0–10.5)

## 2013-04-22 LAB — GLUCOSE, CAPILLARY
Glucose-Capillary: 73 mg/dL (ref 70–99)
Glucose-Capillary: 118 mg/dL — ABNORMAL HIGH (ref 70–99)
Glucose-Capillary: 161 mg/dL — ABNORMAL HIGH (ref 70–99)
Glucose-Capillary: 95 mg/dL (ref 70–99)

## 2013-04-22 LAB — BASIC METABOLIC PANEL
BUN: 6 mg/dL (ref 6–23)
CO2: 28 mEq/L (ref 19–32)
Calcium: 7.9 mg/dL — ABNORMAL LOW (ref 8.4–10.5)
Chloride: 101 mEq/L (ref 96–112)
Creatinine, Ser: 0.42 mg/dL — ABNORMAL LOW (ref 0.50–1.35)
GFR calc Af Amer: >90 mL/min (ref >90)
GFR calc non Af Amer: >90 mL/min (ref >90)
Glucose, Bld: 75 mg/dL (ref 70–99)
Potassium: 3.3 mEq/L — ABNORMAL LOW (ref 3.7–5.3)
Sodium: 138 mEq/L (ref 137–147)

## 2013-04-22 MED ORDER — POTASSIUM CHLORIDE CRYS ER 20 MEQ PO TBCR
40.0000 meq | EXTENDED_RELEASE_TABLET | Freq: Once | ORAL | Status: AC
  Administered 2013-04-22: 40 meq via ORAL
  Filled 2013-04-22: qty 2

## 2013-04-22 MED ORDER — POLYETHYLENE GLYCOL 3350 17 G PO PACK
17.0000 g | PACK | Freq: Every day | ORAL | Status: DC
  Administered 2013-04-23: 17 g via ORAL
  Filled 2013-04-22 (×3): qty 1

## 2013-04-22 MED ORDER — PREDNISONE 20 MG PO TABS
20.0000 mg | ORAL_TABLET | Freq: Every day | ORAL | Status: DC
  Administered 2013-04-23 – 2013-04-24 (×2): 20 mg via ORAL
  Filled 2013-04-22 (×3): qty 1

## 2013-04-22 NOTE — Progress Notes (Signed)
ANTIBIOTIC CONSULT NOTE - FOLLOW UP  Pharmacy Consult for cefepime Indication: pseudomonas post-op lumbar wound infection  Allergies  Allergen Reactions  . Morphine And Related Shortness Of Breath, Nausea And Vomiting and Swelling  . Penicillins Hives, Shortness Of Breath and Rash    REACTION: hives, breathing problems  . Percocet [Oxycodone-Acetaminophen] Anxiety    Patient Measurements: Height: 5' 6"  (167.6 cm) Weight: 137 lb (62.143 kg) IBW/kg (Calculated) : 63.8   Vital Signs: Temp: 97.8 F (36.6 C) (01/15 0520) Temp src: Oral (01/15 0520) BP: 154/79 mmHg (01/15 0520) Pulse Rate: 86 (01/15 0520) Intake/Output from previous day: 01/14 0701 - 01/15 0700 In: 5018.8 [P.O.:600; I.V.:4068.8; IV Piggyback:350] Out: 1950 [SWFUX:3235] Intake/Output from this shift: Total I/O In: 240 [P.O.:240] Out: 402 [Urine:400; Stool:2]  Labs:  Recent Labs  04/21/13 1502 04/22/13 0433  WBC  --  7.4  HGB  --  9.8*  PLT  --  239  CREATININE 0.48* 0.42*   Estimated Creatinine Clearance: 79.8 ml/min (by C-G formula based on Cr of 0.42).   Microbiology: Recent Results (from the past 720 hour(s))  CULTURE, BLOOD (ROUTINE X 2)     Status: None   Collection Time    04/12/13  9:40 PM      Result Value Range Status   Specimen Description BLOOD LEFT ARM   Final   Special Requests BOTTLES DRAWN AEROBIC AND ANAEROBIC 10CC EA   Final   Culture  Setup Time     Final   Value: 04/13/2013 03:58     Performed at [facility]   Culture     Final   Value: NO GROWTH 5 DAYS     Performed at [facility]   Report Status 04/19/2013 FINAL   Final  CULTURE, BLOOD (ROUTINE X 2)     Status: None   Collection Time    04/12/13  9:45 PM      Result Value Range Status   Specimen Description BLOOD LEFT HAND   Final   Special Requests BOTTLES DRAWN AEROBIC ONLY 5CC   Final   Culture  Setup Time     Final   Value: 04/13/2013 03:58     Performed at [facility]   Culture      Final   Value: NO GROWTH 5 DAYS     Performed at [facility]   Report Status 04/19/2013 FINAL   Final  URINE CULTURE     Status: None   Collection Time    04/12/13 11:35 PM      Result Value Range Status   Specimen Description URINE, CLEAN CATCH   Final   Special Requests NONE   Final   Culture  Setup Time     Final   Value: 04/13/2013 00:49     Performed at [place]     Final   Value: NO GROWTH     Performed at [facility]   Culture     Final   Value: NO GROWTH     Performed at [facility]   Report Status 04/14/2013 FINAL   Final  CATH TIP CULTURE     Status: None   Collection Time    04/13/13 12:59 AM      Result Value Range Status   Specimen Description CATH TIP PICC LINE   Final   Special Requests NONE   Final   Culture     Final   Value:  NO GROWTH 2 DAYS     Performed at [facility]   Report Status 04/15/2013 FINAL   Final  MRSA PCR SCREENING     Status: None   Collection Time    04/13/13  5:16 PM      Result Value Range Status   MRSA by PCR NEGATIVE  NEGATIVE Final   Comment:            The GeneXpert MRSA Assay (FDA     approved for NASAL specimens     only), is one component of a     comprehensive MRSA colonization     surveillance program. It is not     intended to diagnose MRSA     infection nor to guide or     monitor treatment for     MRSA infections.    Anti-infectives   Start     Dose/Rate Route Frequency Ordered Stop   04/13/13 0015  ceFEPIme (MAXIPIME) 2 g in dextrose 5 % 50 mL IVPB     2 g 100 mL/hr over 30 Minutes Intravenous 3 times per day 04/13/13 0010        Assessment: 68 yo man on cefepime PTA for post-op lumbar wound infection with pseudomonas.  He was admitted to [facility] from MD's office with hypotension and suspected sepsis.  PICC line taken out 1/6 and replaced 1/8.  WBC remain WNL, afebrile. Scr 0.46 with est CrCl ~5m/min. No growth on cultures  Plan:  1. Continue  cefepime 2 gm IV q8h to continue until Jan 24th to complete 6 week course per ID recommendations- this stop date has been entered 2. Continue to follow renal function, any new cultures  Eusebia Grulke D. Kotaro Buer, PharmD, BCPS Clinical Pharmacist Pager: [phone] 60641/15/2015 1:39 PM

## 2013-04-22 NOTE — Progress Notes (Signed)
  Subjective: Pt with some RLQ abdominal pain. Having BM a little nausea.   Objective: Vital signs in last 24 hours: Temp:  [97.7 F (36.5 C)-98.5 F (36.9 C)] 97.8 F (36.6 C) (01/15 0520) Pulse Rate:  [81-96] 86 (01/15 0520) Resp:  [18] 18 (01/15 0520) BP: (125-154)/(75-82) 154/79 mmHg (01/15 0520) SpO2:  [92 %-98 %] 97 % (01/15 0520) Last BM Date: 04/20/13  Intake/Output from previous day: 05/17/2022 0701 - 01/15 0700 In: 5018.8 [P.O.:600; I.V.:4068.8; IV Piggyback:350] Out: 1950 [Urine:1950] Intake/Output this shift:    GI: soft non tender non distended  Lab Results:   Recent Labs  04/22/13 0433  WBC 7.4  HGB 9.8*  HCT 30.1*  PLT 239   BMET  Recent Labs  2013/05/17 1502 04/22/13 0433  NA 133* 138  K 3.8 3.3*  CL 97 101  CO2 23 28  GLUCOSE 257* 75  BUN 7 6  CREATININE 0.48* 0.42*  CALCIUM 8.1* 7.9*   PT/INR No results found for this basename: LABPROT, INR,  in the last 72 hours ABG No results found for this basename: PHART, PCO2, PO2, HCO3,  in the last 72 hours  Studies/Results: Dg Abd 2 Views  2013/05/17   CLINICAL DATA:  Small bowel obstruction  EXAM: ABDOMEN - 2 VIEW  COMPARISON:  DG ABD 2 VIEWS dated 04/20/2013  FINDINGS: NG tube in the stomach. There are no dilated loops of large or small bowel. No fluid levels. There is gas the rectum. Posterior lumbar fusion noted.  IMPRESSION: Improvement in small bowel obstruction pattern.   tube in stomach   Electronically Signed   By: Suzy Bouchard M.D.   On: 17-May-2013 07:51   Dg Abd 2 Views  04/20/2013   CLINICAL DATA:  Small-bowel obstruction  EXAM: ABDOMEN - 2 VIEW  COMPARISON:  04/20/2011  FINDINGS: Gastric tube extends to the stomach into the duodenum.  Distended small bowel loops in the right abdomen with air-fluid levels similar to prior studies and suspicious for partial small bowel obstruction. Colon is decompressed.  Elevated right hemidiaphragm. The right diaphragm was not included on the upright  view, therefore the study does not evaluate for free intraperitoneal air.  Lumbar fusion with loosening of the L1 is pedicle screw on the left.  IMPRESSION: Dilated small bowel with air-fluid levels consistent with partial small bowel obstruction. NG tube in the duodenum.   Electronically Signed   By: Franchot Gallo M.D.   On: 04/20/2013 08:31    Anti-infectives: Anti-infectives   Start     Dose/Rate Route Frequency Ordered Stop   04/13/13 0015  ceFEPIme (MAXIPIME) 2 g in dextrose 5 % 50 mL IVPB     2 g 100 mL/hr over 30 Minutes Intravenous 3 times per day 04/13/13 0010        Assessment/Plan: p SBO improving. Films look better.  Advance diet Add miralax  LOS: 10 days    [name] A. 04/22/2013

## 2013-04-22 NOTE — Evaluation (Signed)
Physical Therapy Evaluation Patient Details Name: [name] MRN: [id number] DOB: 06/19/1945 Today's Date: 04/22/2013 Time: 4782-9562 PT Time Calculation (min): 36 min  PT Assessment / Plan / Recommendation History of Present Illness  Pt is a 68 yo male who has spinal surgery 11 days ago, now presents for symptoms of post operative infection and SBO  Clinical Impression  Pt admitted with further complications including lower quadrant pain due to a SBO.  Pt currently with functional limitations due to the deficits listed below (see PT Problem List).  Pt will benefit from skilled PT to increase their independence and safety with mobility to allow discharge to the venue listed below.       PT Assessment  Patient needs continued PT services    Follow Up Recommendations  Home health PT;Supervision - Intermittent    Does the patient have the potential to tolerate intense rehabilitation      Barriers to Discharge        Equipment Recommendations       Recommendations for Other Services     Frequency Min 3X/week    Precautions / Restrictions Precautions Precautions: Back Type of Shoulder Precautions: Pt able to state 3/3 precautions. Precaution Comments: able to don back brace with set up Required Braces or Orthoses: Spinal Brace Spinal Brace: Lumbar corset;Applied in sitting position   Pertinent Vitals/Pain       Mobility  Bed Mobility Overal bed mobility: Needs Assistance Bed Mobility: Supine to Sit;Sidelying to Sit Sidelying to sit: Min assist General bed mobility comments: reinforced continuing safe bed mobility technique Transfers Overall transfer level: Needs assistance Transfers: Sit to/from Stand Sit to Stand: Supervision General transfer comment: used back of legs against bed for stability Ambulation/Gait Ambulation/Gait assistance: Supervision Ambulation Distance (Feet): 500 Feet Assistive device: Rolling walker (2 wheeled) Gait Pattern/deviations:  Step-through pattern;Scissoring;Drifts right/left Gait velocity: speed WFL though not measured today Gait velocity interpretation: at or above normal speed for age/gender    Exercises     PT Diagnosis: Generalized weakness  PT Problem List: Decreased strength;Decreased activity tolerance;Decreased balance;Decreased knowledge of use of DME;Decreased safety awareness;Decreased knowledge of precautions PT Treatment Interventions: DME instruction;Gait training;Functional mobility training;Therapeutic activities;Patient/family education     PT Goals(Current goals can be found in the care plan section) Acute Rehab PT Goals Patient Stated Goal: to get a little stronger to go home PT Goal Formulation: With patient Time For Goal Achievement: 04/29/13 Potential to Achieve Goals: Good  Visit Information  Last PT Received On: 04/22/13 Assistance Needed: +1 History of Present Illness: Pt is a 68 yo male who has spinal surgery 11 days ago, now presents for symptoms of post operative infection and SBO       Prior Functioning  Home Living Family/patient expects to be discharged to:: Private residence Living Arrangements: Spouse/significant other Available Help at Discharge: Family;Available PRN/intermittently Type of Home: House Home Access: Stairs to enter [employer] of Steps: 5 Entrance Stairs-Rails: Right Home Layout: Two level;Able to live on main level with bedroom/bathroom Home Equipment: Dan Humphreys - 2 wheels;Cane - single point Additional Comments: pt does not usually use an assistive device but recently has used a cane a little bit. He has fallen a couple of times when getting up at night, no serious injuries.   Lives With: Significant other Prior Function Level of Independence: Independent Communication Communication: No difficulties (phonation is low but able to hear fine) Dominant Hand: Right    Cognition  Cognition Arousal/Alertness: Awake/alert Behavior During  Therapy: [facility]  for tasks assessed/performed Overall Cognitive Status: Within Functional Limits for tasks assessed    Extremity/Trunk Assessment Upper Extremity Assessment Upper Extremity Assessment: Overall WFL for tasks assessed RUE Deficits / Details: pre-existing deficits due to RA and multiple surgeries Lower Extremity Assessment Lower Extremity Assessment: Overall WFL for tasks assessed;Generalized weakness RLE Deficits / Details: pt has had an ankle joint replacement and he reports that RA is the worst in his right foot, this causes him to ambulate with abnormal gait. Hip and knee strength grossly 4/5 Cervical / Trunk Assessment Cervical / Trunk Assessment: Normal   Balance Balance Overall balance assessment: Needs assistance Standing balance comment: dynamically mildly unsteady, but pt can self-recover with ease  End of Session PT - End of Session Equipment Utilized During Treatment: Back brace (placed after the majority of treatment, pt forgot to donn) Activity Tolerance: Patient tolerated treatment well Patient left: in chair;with call bell/phone within reach Nurse Communication: Mobility status  GP     Abdoul Encinas, Eliseo Gum 04/22/2013, 12:49 PM 04/22/2013  [place], PT [phone]  (pager)

## 2013-04-22 NOTE — Evaluation (Signed)
Occupational Therapy Evaluation Patient Details Name: [name] MRN: [id number] DOB: November 21, 1945 Today's Date: 04/22/2013 Time: 2992-4268 OT Time Calculation (min): 42 min  OT Assessment / Plan / Recommendation History of present illness Pt is a 68 yo male who has spinal surgery 11 days ago, now presents for symptoms of post operative infection and SBO   Clinical Impression   Pt admitted with above.  He demonstrates the below listed deficits and will benefit from continued OT to maximize safety and independence with BADLs.   Anticipate he will progress to modified independence quickly.    OT Assessment  Patient needs continued OT Services    Follow Up Recommendations  No OT follow up;Supervision/Assistance - 24 hour    Barriers to Discharge      Equipment Recommendations  None recommended by OT    Recommendations for Other Services    Frequency  Min 2X/week    Precautions / Restrictions Precautions Precautions: Back Type of Shoulder Precautions: Pt able to state 3/3 precautions. Precaution Comments: able to don back brace with set up Required Braces or Orthoses: Spinal Brace Spinal Brace: Lumbar corset;Applied in sitting position   Pertinent Vitals/Pain     ADL  Eating/Feeding: Independent Where Assessed - Eating/Feeding: Chair Grooming: Wash/dry hands;Wash/dry face;Teeth care;Brushing hair;Supervision/safety Where Assessed - Grooming: Unsupported standing Upper Body Bathing: Set up Where Assessed - Upper Body Bathing: Unsupported sitting Lower Body Bathing: Min guard Where Assessed - Lower Body Bathing: Unsupported sit to stand Upper Body Dressing: Supervision/safety Where Assessed - Upper Body Dressing: Unsupported sitting Lower Body Dressing: Min guard Where Assessed - Lower Body Dressing: Supported sit to stand Toilet Transfer: Magazine features editor Method: Sit to stand;Stand pivot (ambulating ) Science writer: Comfort height  toilet Toileting - Water quality scientist and Hygiene: Supervision/safety Where Assessed - Camera operator Manipulation and Hygiene: Standing Transfers/Ambulation Related to ADLs: min guard without AD ADL Comments: Pt able to recall safe techniques in regards to back precautions     OT Diagnosis: Generalized weakness  OT Problem List: Decreased strength;Decreased knowledge of use of DME or AE;Decreased knowledge of precautions OT Treatment Interventions: Self-care/ADL training;DME and/or AE instruction;Therapeutic activities;Patient/family education   OT Goals(Current goals can be found in the care plan section) Acute Rehab OT Goals Patient Stated Goal: to go home OT Goal Formulation: With patient Time For Goal Achievement: 04/07/13 Potential to Achieve Goals: Good ADL Goals Pt Will Perform Grooming: with modified independence;standing Pt Will Perform Lower Body Bathing: with modified independence;sit to/from stand Pt Will Perform Lower Body Dressing: with modified independence;sit to/from stand Pt Will Transfer to Toilet: with modified independence;regular height toilet;grab bars;ambulating Pt Will Perform Toileting - Clothing Manipulation and hygiene: with modified independence;sit to/from stand Pt Will Perform Tub/Shower Transfer: Shower transfer;with modified independence;ambulating  Visit Information  Last OT Received On: 04/22/13 Assistance Needed: +1 History of Present Illness: Pt is a 68 yo male who has spinal surgery 11 days ago, now presents for symptoms of post operative infection and SBO       Prior Functioning     Home Living Family/patient expects to be discharged to:: Private residence Living Arrangements: Spouse/significant other Available Help at Discharge: Family;Available PRN/intermittently Type of Home: House Home Access: Stairs to enter [employer] of Steps: 5 Entrance Stairs-Rails: Right Home Layout: Two level;Able to live on main level  with bedroom/bathroom Home Equipment: Gilford Rile - 2 wheels;Cane - single point Additional Comments: pt does not usually use an assistive device but recently has used a cane  a little bit. He has fallen a couple of times when getting up at night, no serious injuries.   Lives With: Significant other Prior Function Level of Independence: Independent Comments: prior to back surgery  Communication Communication: No difficulties Dominant Hand: Right         Vision/Perception     Cognition  Cognition Arousal/Alertness: Awake/alert Behavior During Therapy: WFL for tasks assessed/performed Overall Cognitive Status: Within Functional Limits for tasks assessed    Extremity/Trunk Assessment Upper Extremity Assessment Upper Extremity Assessment: Overall WFL for tasks assessed RUE Deficits / Details: pre-existing deficits due to RA and multiple surgeries Lower Extremity Assessment Lower Extremity Assessment: Defer to PT evaluation RLE Deficits / Details: pt has had an ankle joint replacement and he reports that RA is the worst in his right foot, this causes him to ambulate with abnormal gait. Hip and knee strength grossly 4/5 Cervical / Trunk Assessment Cervical / Trunk Assessment: Normal     Mobility Bed Mobility Overal bed mobility: Needs Assistance Bed Mobility: Sit to Supine Sidelying to sit: Min assist General bed mobility comments: reinforced continuing safe bed mobility technique Transfers Overall transfer level: Needs assistance Transfers: Sit to/from Stand Sit to Stand: Supervision General transfer comment: used back of legs against bed for stability     Exercise     Balance Balance Overall balance assessment: Needs assistance Standing balance comment: dynamically mildly unsteady, but pt can self-recover with ease   End of Session OT - End of Session Equipment Utilized During Treatment: Back brace Activity Tolerance: Patient tolerated treatment well Patient left: in  bed;with call bell/phone within reach Nurse Communication: Mobility status  GO     Lucille Passy M 04/22/2013, 2:44 PM

## 2013-04-22 NOTE — Progress Notes (Signed)
[facility] PROGRESS NOTE  OLA FAWVER HWE:[id number] DOB: 05-Aug-1945 DOA: 04/12/2013  PCP: Adella Hare, MD  Brief narrative Pt is a 68 y.o. male with hx of RA on chronic steroids for several years, hx of HTN, COPD, posterior lumbar fusion, post op lumbar wound infection with pseudomonas, s/p debridement, on Cefepime via PICC line, presented to Dr Derek Mound office, found to have fever and hypotension, and sent to the ER for further work up and Tx. Picc was removed and tip cultured, BC obtained, increase Cefepime to max allowable dose, give steroid and IVF, and hospitalist was asked to admit him.   Assessment/Plan: .Hypotension/Sepsis -Now resolved. -Adrenal insufficiency less likely as serum cortisols so far normal x2 (noted that patient has been on chronic low-dose prednisone and blood pressures on admission responded well to steroids) -Status post PICC line removal -Catheter tip and blood cultures so far with no growth to date-continue cefepime -Appreciate ID input, patient to continue cefepime until January 24 -BP elevated. Cozaar resumed.  -Cultures remained with no growth to date, PICC has been placed again.   Marland KitchenSBO -CT abd 1/12 with sbo with ?adhesion vs mass -NG removed by surgery 1/14. Diet advanced. Patient with BM's. Management per gen surgery.   .Status post multilevel lumbar fusion with probable wound infection -Status post I&D on 12/11 -Was being followed by neurosurgery -Cultures with no growth -Continue pain management  .Recent Pseudomonas urinary tract infection -continue cefepime  .Rheumatoid arthritis (714.0) -Change to oral steroids.  Marland KitchenHYPERTENSION -Continue Cozaar at current dose, blood pressure stable  .Dehydration/Hyponatremia/Hyponatremia. -Improved. Replete K.  . Chronic steroid use - For RA.   [name] Hoarseness -Seen by ENT on 1/8. Appreciate Dr.Gore's input>> no vocal cord injury. -Speech therapy consulted as recommended>> pt referred to  [facility] outpatient [facility].  Marland KitchenRLE  infiltrate vs consolidation -pt assymptomatic>> no cough or SOB, more likely ATX. -incentive spirometry  Code Status: Full Code DVT Prophylaxis: SCD's Family Communication: Discussed with patient.  Disposition Plan: To home when medically ready. PT/OT to see. Anticipate discharge in 1-2 days.   Consultants:  ID  Neurosurgery  CCS  Procedures:  None  Antibiotics:  Cefepime to be continued through 1/23  HPI/Subjective: Patient feels better. Having BM's. Still with some right sided abdominal pain. No nausea or vomiting. Tolerating diet. Back pain is better. He wants to get up and walk.  Objective: Filed Vitals:   04/22/13 0520  BP: 154/79  Pulse: 86  Temp: 97.8 F (36.6 C)  Resp: 18    Intake/Output Summary (Last 24 hours) at 04/22/13 1230 Last data filed at 04/22/13 1100  Gross per 24 hour  Intake 5258.75 ml  Output   2052 ml  Net 3206.75 ml   Filed Weights   04/12/13 1603  Weight: 62.143 kg (137 lb)    Exam:  General: alert & oriented x 3 In NAD NG in place  Cardiovascular: RRR, nl S1 s2 Respiratory: CTAB Abdomen: soft, good BS, mild right sided tenderness, not as distended.  Extremities: No cyanosis and no edema    Data Reviewed: Basic Metabolic Panel:  Recent Labs Lab 04/18/13 0447 04/21/13 1502 04/22/13 0433  NA 137 133* 138  K 3.7 3.8 3.3*  CL 97 97 101  CO2 29 23 28   GLUCOSE 88 257* 75  BUN 9 7 6   CREATININE 0.47* 0.48* 0.42*  CALCIUM 8.4 8.1* 7.9*    Recent Labs Lab 04/18/13 0447  LIPASE 28   CBC:  Recent Labs Lab 04/22/13 0433  WBC 7.4  HGB 9.8*  HCT 30.1*  MCV 89.1  PLT 239     Recent Results (from the past 240 hour(s))  CULTURE, BLOOD (ROUTINE X 2)     Status: None   Collection Time    04/12/13  9:40 PM      Result Value Range Status   Specimen Description BLOOD LEFT ARM   Final   Special Requests BOTTLES DRAWN AEROBIC AND ANAEROBIC 10CC EA   Final   Culture  Setup  Time     Final   Value: 04/13/2013 03:58     Performed at [facility]   Culture     Final   Value: NO GROWTH 5 DAYS     Performed at [facility]   Report Status 04/19/2013 FINAL   Final  CULTURE, BLOOD (ROUTINE X 2)     Status: None   Collection Time    04/12/13  9:45 PM      Result Value Range Status   Specimen Description BLOOD LEFT HAND   Final   Special Requests BOTTLES DRAWN AEROBIC ONLY 5CC   Final   Culture  Setup Time     Final   Value: 04/13/2013 03:58     Performed at [facility]   Culture     Final   Value: NO GROWTH 5 DAYS     Performed at [facility]   Report Status 04/19/2013 FINAL   Final  URINE CULTURE     Status: None   Collection Time    04/12/13 11:35 PM      Result Value Range Status   Specimen Description URINE, CLEAN CATCH   Final   Special Requests NONE   Final   Culture  Setup Time     Final   Value: 04/13/2013 00:49     Performed at [place]     Final   Value: NO GROWTH     Performed at [facility]   Culture     Final   Value: NO GROWTH     Performed at [facility]   Report Status 04/14/2013 FINAL   Final  CATH TIP CULTURE     Status: None   Collection Time    04/13/13 12:59 AM      Result Value Range Status   Specimen Description CATH TIP PICC LINE   Final   Special Requests NONE   Final   Culture     Final   Value: NO GROWTH 2 DAYS     Performed at [facility]   Report Status 04/15/2013 FINAL   Final  MRSA PCR SCREENING     Status: None   Collection Time    04/13/13  5:16 PM      Result Value Range Status   MRSA by PCR NEGATIVE  NEGATIVE Final   Comment:            The GeneXpert MRSA Assay (FDA     approved for NASAL specimens     only), is one component of a     comprehensive MRSA colonization     surveillance program. It is not     intended to diagnose MRSA     infection nor to guide or     monitor treatment for     MRSA infections.      Studies: Dg Abd 1 View  04/22/2013   CLINICAL DATA:  Abdominal pain and  history of obstruction.  EXAM: ABDOMEN - 1 VIEW  COMPARISON:  04/19/2013 and 04/21/2013  FINDINGS: The nasogastric tube has been removed. There is bowel gas within the small and large bowel. Stool in the rectum. Pedicle screw and rod fixation in the lumbar spine is again noted.  IMPRESSION: Nonspecific bowel gas pattern.  Removal of nasogastric tube.   Electronically Signed   By: Markus Daft M.D.   On: 04/22/2013 07:55   Dg Abd 2 Views  04/21/2013   CLINICAL DATA:  Small bowel obstruction  EXAM: ABDOMEN - 2 VIEW  COMPARISON:  DG ABD 2 VIEWS dated 04/20/2013  FINDINGS: NG tube in the stomach. There are no dilated loops of large or small bowel. No fluid levels. There is gas the rectum. Posterior lumbar fusion noted.  IMPRESSION: Improvement in small bowel obstruction pattern.   tube in stomach   Electronically Signed   By: Suzy Bouchard M.D.   On: 04/21/2013 07:51    Scheduled Meds: . aspirin  81 mg Per Tube Daily  . ceFEPime (MAXIPIME) IV  2 g Intravenous Q8H  . cholecalciferol  1,000 Units Oral Daily  . colchicine  0.6 mg Per Tube BID  . docusate sodium  100 mg Oral BID  . feeding supplement (ENSURE COMPLETE)  237 mL Oral Daily  . feeding supplement (RESOURCE BREEZE)  1 Container Oral Q1500  . FLUoxetine  40 mg Oral BID  . fluticasone  2 spray Each Nare Daily  . gabapentin  300 mg Oral Custom  . gabapentin  600 mg Oral BID AC & HS  . insulin aspart  0-9 Units Subcutaneous TID WC  . losartan  25 mg Oral Daily  . methylPREDNISolone (SOLU-MEDROL) injection  20 mg Intravenous Daily  . pantoprazole  40 mg Oral BID  . polyethylene glycol  17 g Oral Daily  . potassium chloride  40 mEq Oral Once  . simvastatin  40 mg Oral Daily  . sodium chloride  3 mL Intravenous Q12H  . tiotropium  18 mcg Inhalation Daily  . traZODone  100 mg Oral QHS   Continuous Infusions: . sodium chloride 75 mL/hr at 04/22/13 0055     Principal Problem:   Sepsis associated hypotension Active Problems:   HYPERTENSION   Rheumatoid arthritis(714.0)   Dehydration   Wound infection after surgery   Adrenal insufficiency   Chronic use of steroids   Protein-calorie malnutrition, severe    [facility]  [facility] Pager [phone].   If 7PM-7AM, please contact night-coverage at [email or web address], password [email or web address] 04/22/2013, 12:30 PM  LOS: 10 days

## 2013-04-23 LAB — GLUCOSE, CAPILLARY
Glucose-Capillary: 87 mg/dL (ref 70–99)
Glucose-Capillary: 130 mg/dL — ABNORMAL HIGH (ref 70–99)
Glucose-Capillary: 142 mg/dL — ABNORMAL HIGH (ref 70–99)
Glucose-Capillary: 134 mg/dL — ABNORMAL HIGH (ref 70–99)

## 2013-04-23 LAB — BASIC METABOLIC PANEL
BUN: 6 mg/dL (ref 6–23)
CO2: 27 mEq/L (ref 19–32)
Calcium: 7.9 mg/dL — ABNORMAL LOW (ref 8.4–10.5)
Chloride: 105 mEq/L (ref 96–112)
Creatinine, Ser: 0.51 mg/dL (ref 0.50–1.35)
GFR calc Af Amer: >90 mL/min (ref >90)
GFR calc non Af Amer: >90 mL/min (ref >90)
Glucose, Bld: 91 mg/dL (ref 70–99)
Potassium: 3.6 mEq/L — ABNORMAL LOW (ref 3.7–5.3)
Sodium: 141 mEq/L (ref 137–147)

## 2013-04-23 MED ORDER — POTASSIUM CHLORIDE CRYS ER 20 MEQ PO TBCR
40.0000 meq | EXTENDED_RELEASE_TABLET | Freq: Once | ORAL | Status: AC
  Administered 2013-04-23: 40 meq via ORAL
  Filled 2013-04-23: qty 2

## 2013-04-23 MED ORDER — HYDROMORPHONE HCL PF 1 MG/ML IJ SOLN
1.0000 mg | Freq: Four times a day (QID) | INTRAMUSCULAR | Status: DC | PRN
  Administered 2013-04-23 – 2013-04-24 (×3): 1 mg via INTRAVENOUS
  Filled 2013-04-23 (×3): qty 1

## 2013-04-23 MED ORDER — CEFEPIME HCL 2 G IJ SOLR: 2.0000 g | Freq: Three times a day (TID) | INTRAMUSCULAR | Status: DC

## 2013-04-23 MED ORDER — LOSARTAN POTASSIUM 50 MG PO TABS
50.0000 mg | ORAL_TABLET | Freq: Every day | ORAL | Status: DC
  Administered 2013-04-23 – 2013-04-24 (×2): 50 mg via ORAL
  Filled 2013-04-23 (×2): qty 1

## 2013-04-23 NOTE — Progress Notes (Signed)
Occupational Therapy Treatment Patient Details Name: [name] MRN: [id number] DOB: [birth year] Today's Date: 04/23/2013 Time: 0233-4356 OT Time Calculation (min): 0 min  OT Assessment / Plan / Recommendation  History of present illness Pt is a 68 yo male who has spinal surgery 11 days ago, now presents for symptoms of post operative infection and SBO   OT comments  Pt progressing nicely.  He requires supervision for basic self care activities  Follow Up Recommendations  No OT follow up;Supervision/Assistance - 24 hour    Barriers to Discharge       Equipment Recommendations  None recommended by OT    Recommendations for Other Services Rehab consult  Frequency Min 2X/week   Progress towards OT Goals Progress towards OT goals: Progressing toward goals  Plan Discharge plan remains appropriate    Precautions / Restrictions Precautions Precautions: Back Precaution Booklet Issued: No Precaution Comments: able to don back brace with set up Required Braces or Orthoses: Spinal Brace Spinal Brace: Lumbar corset;Applied in sitting position Restrictions Weight Bearing Restrictions: No   Pertinent Vitals/Pain     ADL  Grooming: Wash/dry hands;Supervision/safety Where Assessed - Grooming: Unsupported standing Toilet Transfer: Copy Method: Stand pivot;Sit to Loss adjuster, chartered: Comfort height toilet;Grab bars Toileting - Clothing Manipulation and Hygiene: Supervision/safety Where Assessed - Best boy and Hygiene: Standing Equipment Used: Back brace;Rolling walker Transfers/Ambulation Related to ADLs: S    OT Diagnosis:    OT Problem List:   OT Treatment Interventions:     OT Goals(current goals can now be found in the care plan section)    Visit Information  Last OT Received On: 04/23/13 Assistance Needed: +1 History of Present Illness: Pt is a 68 yo male who has spinal surgery 11 days ago, now  presents for symptoms of post operative infection and SBO    Subjective Data      Prior Functioning       Cognition  Cognition Arousal/Alertness: Awake/alert Behavior During Therapy: WFL for tasks assessed/performed Overall Cognitive Status: Within Functional Limits for tasks assessed    Mobility  Bed Mobility Overal bed mobility: Modified Independent Bed Mobility: Sidelying to Sit;Rolling Rolling: Modified independent (Device/Increase time) Sidelying to sit: Supervision General bed mobility comments: reinforced continuing safe bed mobility technique, needing to practice without rail Transfers Overall transfer level: Needs assistance Equipment used: Rolling walker (2 wheeled) Transfers: Sit to/from [employer] Sit to Stand: Supervision Stand pivot transfers: Supervision    Exercises      Balance Balance Overall balance assessment: Modified Independent  End of Session OT - End of Session Equipment Utilized During Treatment: Back brace Activity Tolerance: Patient tolerated treatment well Patient left: in bed;with call bell/phone within reach Nurse Communication: Mobility status  GO     [name] M 04/23/2013, 6:18 PM

## 2013-04-23 NOTE — Progress Notes (Signed)
[facility] PROGRESS NOTE  [name] BJY:[id number] DOB: Nov 30, 1945 DOA: 04/12/2013  PCP: Adella Hare, MD  Brief narrative Pt is a 68 y.o. male with hx of RA on chronic steroids for several years, hx of HTN, COPD, posterior lumbar fusion, post op lumbar wound infection with pseudomonas, s/p debridement, on Cefepime via PICC line, presented to Dr Derek Mound office, found to have fever and hypotension, and sent to the ER for further work up and Tx. Picc was removed and tip cultured, BC obtained, increase Cefepime to max allowable dose, give steroid and IVF, and hospitalist was asked to admit him.   Assessment/Plan: .Hypotension/Sepsis -Now resolved. -Adrenal insufficiency less likely as serum cortisols so far normal x2 (noted that patient has been on chronic low-dose prednisone and blood pressures on admission responded well to steroids) -Status post PICC line removal -Catheter tip and blood cultures so far with no growth to date-continue cefepime -Appreciate ID input, patient to continue cefepime until January 24 -BP elevated. Cozaar resumed.  -Cultures remained with no growth to date, PICC has been placed again.   Marland KitchenSBO -CT abd 1/12 with sbo with ?adhesion vs mass -NG removed by surgery 1/14. Diet advanced. Patient with BM's.  -SBO appears to have resolved.   .Status post multilevel lumbar fusion with probable wound infection -Status post I&D on 12/11 -Was being followed by neurosurgery -Cultures with no growth -Continue pain management  .Recent Pseudomonas urinary tract infection -continue cefepime  .Rheumatoid arthritis (714.0) -Change to oral steroids.  Marland KitchenHYPERTENSION -BP poorly controlled. Increase Cozaar to home dose.   .Dehydration/Hyponatremia/Hyponatremia. -Improved. Replete K.  . Chronic steroid use - For RA.   [name] Hoarseness -Seen by ENT on 1/8. Appreciate Dr.Gore's input>> no vocal cord injury. -Speech therapy consulted as recommended>> pt referred  to [facility] outpatient speech therapy. Can be pursued as OP.  Marland KitchenRLE  infiltrate vs consolidation -pt assymptomatic>> no cough or SOB, more likely ATX. -incentive spirometry  Code Status: Full Code DVT Prophylaxis: SCD's Family Communication: Discussed with patient.  Disposition Plan: Seen by PT/OT. Needs Home health PT/RN for home IV abx. Anticipate discharge 1/17.    Consultants:  ID  Neurosurgery  CCS  Procedures:  None  Antibiotics:  Cefepime to be continued through 1/24  HPI/Subjective: Patient feels better. Having BM's. Last Bm was at midnight. Still with some right sided abdominal pain. No nausea or vomiting. Tolerating diet. Back pain is better.  Objective: Filed Vitals:   04/23/13 0536  BP: 152/83  Pulse: 89  Temp:   Resp: 18    Intake/Output Summary (Last 24 hours) at 04/23/13 0937 Last data filed at 04/23/13 0865  Gross per 24 hour  Intake    480 ml  Output   1602 ml  Net  -1122 ml   Filed Weights   04/12/13 1603  Weight: 62.143 kg (137 lb)    Exam:  General: alert & oriented x 3 In NAD NG in place  Cardiovascular: RRR, nl S1 s2 Respiratory: CTAB Abdomen: soft, good BS, mild right sided tenderness, not distended.  Extremities: No cyanosis and no edema    Data Reviewed: Basic Metabolic Panel:  Recent Labs Lab 04/18/13 0447 04/21/13 1502 04/22/13 0433 04/23/13 0434  NA 137 133* 138 141  K 3.7 3.8 3.3* 3.6*  CL 97 97 101 105  CO2 29 23 28 27   GLUCOSE 88 257* 75 91  BUN 9 7 6 6   CREATININE 0.47* 0.48* 0.42* 0.51  CALCIUM 8.4 8.1* 7.9* 7.9*  Recent Labs Lab 04/18/13 0447  LIPASE 28   CBC:  Recent Labs Lab 04/22/13 0433  WBC 7.4  HGB 9.8*  HCT 30.1*  MCV 89.1  PLT 239     Recent Results (from the past 240 hour(s))  MRSA PCR SCREENING     Status: None   Collection Time    04/13/13  5:16 PM      Result Value Range Status   MRSA by PCR NEGATIVE  NEGATIVE Final   Comment:            The GeneXpert MRSA Assay (FDA      approved for NASAL specimens     only), is one component of a     comprehensive MRSA colonization     surveillance program. It is not     intended to diagnose MRSA     infection nor to guide or     monitor treatment for     MRSA infections.     Studies: Dg Abd 1 View  04/22/2013   CLINICAL DATA:  Abdominal pain and history of obstruction.  EXAM: ABDOMEN - 1 VIEW  COMPARISON:  04/19/2013 and 04/21/2013  FINDINGS: The nasogastric tube has been removed. There is bowel gas within the small and large bowel. Stool in the rectum. Pedicle screw and rod fixation in the lumbar spine is again noted.  IMPRESSION: Nonspecific bowel gas pattern.  Removal of nasogastric tube.   Electronically Signed   By: Markus Daft M.D.   On: 04/22/2013 07:55    Scheduled Meds: . aspirin  81 mg Per Tube Daily  . ceFEPime (MAXIPIME) IV  2 g Intravenous Q8H  . cholecalciferol  1,000 Units Oral Daily  . colchicine  0.6 mg Per Tube BID  . docusate sodium  100 mg Oral BID  . feeding supplement (ENSURE COMPLETE)  237 mL Oral Daily  . feeding supplement (RESOURCE BREEZE)  1 Container Oral Q1500  . FLUoxetine  40 mg Oral BID  . fluticasone  2 spray Each Nare Daily  . gabapentin  300 mg Oral Custom  . gabapentin  600 mg Oral BID AC & HS  . insulin aspart  0-9 Units Subcutaneous TID WC  . losartan  50 mg Oral Daily  . pantoprazole  40 mg Oral BID  . polyethylene glycol  17 g Oral Daily  . potassium chloride  40 mEq Oral Once  . predniSONE  20 mg Oral Q breakfast  . simvastatin  40 mg Oral Daily  . sodium chloride  3 mL Intravenous Q12H  . tiotropium  18 mcg Inhalation Daily  . traZODone  100 mg Oral QHS   Continuous Infusions: . sodium chloride 75 mL/hr at 04/22/13 1715    Principal Problem:   Sepsis associated hypotension Active Problems:   HYPERTENSION   Rheumatoid arthritis(714.0)   Dehydration   Wound infection after surgery   Adrenal insufficiency   Chronic use of steroids   Protein-calorie  malnutrition, severe    [facility]  [facility] Pager [phone].   If 7PM-7AM, please contact night-coverage at [email or web address], password [facility] 04/23/2013, 9:37 AM  LOS: 11 days

## 2013-04-23 NOTE — Progress Notes (Signed)
  Subjective: Occasional abdominal pain  Moving bowels. No vomiting  Objective: Vital signs in last 24 hours: Temp:  [97.5 F (36.4 C)-98.4 F (36.9 C)] 98.4 F (36.9 C) (01/15 2121) Pulse Rate:  [86-96] 89 (01/16 0536) Resp:  [16-18] 18 (01/16 0536) BP: (139-152)/(83-86) 152/83 mmHg (01/16 0536) SpO2:  [93 %-96 %] 93 % (01/16 0536) Last BM Date: 04/22/13  Intake/Output from previous day: 01/15 0701 - 01/16 0700 In: 720 [P.O.:720] Out: 1602 [Urine:1600; Stool:2] Intake/Output this shift:    GI: soft, non-tender; bowel sounds normal; no masses,  no organomegaly  Lab Results:   Recent Labs  04/22/13 0433  WBC 7.4  HGB 9.8*  HCT 30.1*  PLT 239   BMET  Recent Labs  04/22/13 0433 04/23/13 0434  NA 138 141  K 3.3* 3.6*  CL 101 105  CO2 28 27  GLUCOSE 75 91  BUN 6 6  CREATININE 0.42* 0.51  CALCIUM 7.9* 7.9*   PT/INR No results found for this basename: LABPROT, INR,  in the last 72 hours ABG No results found for this basename: PHART, PCO2, PO2, HCO3,  in the last 72 hours  Studies/Results: Dg Abd 1 View  04/22/2013   CLINICAL DATA:  Abdominal pain and history of obstruction.  EXAM: ABDOMEN - 1 VIEW  COMPARISON:  04/19/2013 and 04/21/2013  FINDINGS: The nasogastric tube has been removed. There is bowel gas within the small and large bowel. Stool in the rectum. Pedicle screw and rod fixation in the lumbar spine is again noted.  IMPRESSION: Nonspecific bowel gas pattern.  Removal of nasogastric tube.   Electronically Signed   By: Markus Daft M.D.   On: 04/22/2013 07:55    Anti-infectives: Anti-infectives   Start     Dose/Rate Route Frequency Ordered Stop   04/13/13 0015  ceFEPIme (MAXIPIME) 2 g in dextrose 5 % 50 mL IVPB     2 g 100 mL/hr over 30 Minutes Intravenous 3 times per day 04/13/13 0010 05/01/13 2359      Assessment/Plan: pSBO resolved. Adv diet as tolerated Will sign off  LOS: 11 days    Nataki Mccrumb A. 04/23/2013

## 2013-04-23 NOTE — Progress Notes (Signed)
Physical Therapy Treatment Patient Details Name: [name] MRN: [id number] DOB: April 29, 1945 Today's Date: 04/23/2013 Time: 2620-3559 PT Time Calculation (min): 33 min  PT Assessment / Plan / Recommendation  History of Present Illness Pt is a 68 yo male who has spinal surgery 11 days ago, now presents for symptoms of post operative infection and SBO   PT Comments   Ready for D/C home, he is not fully independent, but should be safe with family's assist and HHPT to improve function and safety   Follow Up Recommendations  Home health PT;Supervision - Intermittent     Does the patient have the potential to tolerate intense rehabilitation     Barriers to Discharge        Equipment Recommendations  None recommended by PT    Recommendations for Other Services    Frequency Min 3X/week   Progress towards PT Goals Progress towards PT goals: Progressing toward goals  Plan Current plan remains appropriate    Precautions / Restrictions Precautions Precautions: Back Precaution Booklet Issued: No Precaution Comments: able to don back brace with set up Required Braces or Orthoses: Spinal Brace Spinal Brace: Lumbar corset;Applied in sitting position Restrictions Weight Bearing Restrictions: No   Pertinent Vitals/Pain     Mobility  Bed Mobility Overal bed mobility: Modified Independent Bed Mobility: Sidelying to Sit;Rolling Rolling: Modified independent (Device/Increase time) Sidelying to sit: Supervision (heavy use of the rail) General bed mobility comments: reinforced continuing safe bed mobility technique, needing to practice without rail Transfers Overall transfer level: Needs assistance Equipment used: Rolling walker (2 wheeled) Transfers: Sit to/from Stand Sit to Stand: Min guard;Supervision (min guard from lower surface) Ambulation/Gait Ambulation/Gait assistance: Supervision (pushing IV pole) Ambulation Distance (Feet): 500 Feet Assistive device:  (iv pole) Gait  Pattern/deviations: Step-through pattern;Wide base of support Gait velocity interpretation: at or above normal speed for age/gender Stairs: Yes Stairs assistance: Min assist Stair Management: One rail Right;Step to pattern;Forwards;Sideways Number of Stairs: 4 (x2) General stair comments: Difficulty ascending due to weakness needing min assistt    Exercises     PT Diagnosis:    PT Problem List:   PT Treatment Interventions:     PT Goals (current goals can now be found in the care plan section) Acute Rehab PT Goals PT Goal Formulation: With patient Time For Goal Achievement: 04/29/13 Potential to Achieve Goals: Good  Visit Information  Last PT Received On: 04/23/13 Assistance Needed: +1 History of Present Illness: Pt is a 67 yo male who has spinal surgery 11 days ago, now presents for symptoms of post operative infection and SBO    Subjective Data  Subjective: My wife's sick and I don't want to get into that, but I'm supposed to go home tomorrow.   Cognition  Cognition Arousal/Alertness: Awake/alert Behavior During Therapy: WFL for tasks assessed/performed Overall Cognitive Status: Within Functional Limits for tasks assessed    Balance  Balance Overall balance assessment: Modified Independent  End of Session PT - End of Session Equipment Utilized During Treatment: Back brace Activity Tolerance: Patient tolerated treatment well Patient left: Other (comment) (sitting EOB) Nurse Communication: Mobility status   GP     [name], [name] 04/23/2013, 4:52 PM 04/23/2013  [name], [name] [phone]  (pager)

## 2013-04-24 ENCOUNTER — Other Ambulatory Visit: Payer: Self-pay | Admitting: Internal Medicine

## 2013-04-24 LAB — BASIC METABOLIC PANEL
BUN: 5 mg/dL — ABNORMAL LOW (ref 6–23)
CO2: 28 mEq/L (ref 19–32)
Calcium: 7.9 mg/dL — ABNORMAL LOW (ref 8.4–10.5)
Chloride: 101 mEq/L (ref 96–112)
Creatinine, Ser: 0.49 mg/dL — ABNORMAL LOW (ref 0.50–1.35)
GFR calc Af Amer: >90 mL/min (ref >90)
GFR calc non Af Amer: >90 mL/min (ref >90)
Glucose, Bld: 79 mg/dL (ref 70–99)
Potassium: 3.4 mEq/L — ABNORMAL LOW (ref 3.7–5.3)
Sodium: 138 mEq/L (ref 137–147)

## 2013-04-24 LAB — GLUCOSE, CAPILLARY
Glucose-Capillary: 71 mg/dL (ref 70–99)
Glucose-Capillary: 133 mg/dL — ABNORMAL HIGH (ref 70–99)

## 2013-04-24 MED ORDER — HYDROMORPHONE HCL 4 MG PO TABS: 4.0000 mg | ORAL_TABLET | Freq: Two times a day (BID) | ORAL | Status: DC | PRN

## 2013-04-24 MED ORDER — HEPARIN SOD (PORK) LOCK FLUSH 100 UNIT/ML IV SOLN
250.0000 [IU] | INTRAVENOUS | Status: AC | PRN
  Administered 2013-04-24: 250 [IU]

## 2013-04-24 MED ORDER — POLYETHYLENE GLYCOL 3350 17 G PO PACK: 17.0000 g | PACK | Freq: Every day | ORAL | Status: DC | PRN

## 2013-04-24 MED ORDER — POTASSIUM CHLORIDE CRYS ER 20 MEQ PO TBCR
40.0000 meq | EXTENDED_RELEASE_TABLET | Freq: Once | ORAL | Status: AC
  Administered 2013-04-24: 40 meq via ORAL
  Filled 2013-04-24: qty 2

## 2013-04-24 MED ORDER — DOCUSATE SODIUM 100 MG PO CAPS: 100.0000 mg | ORAL_CAPSULE | Freq: Two times a day (BID) | ORAL | Status: DC

## 2013-04-24 NOTE — Progress Notes (Signed)
   CARE MANAGEMENT NOTE 04/24/2013  Patient:  [name], [name]   Account Number:  [id number]  Date Initiated:  04/14/2013  Documentation initiated by:  Marvetta Gibbons  Subjective/Objective Assessment:   Pt admitted with sepsis/infection     Action/Plan:   PTA pt lived at home with spouse- PTA had HH- RN/PT and iv abx with [facility] - will need resumption orders at discharge for [facility]- NCM to follow   Anticipated DC Date:  04/25/2013   Anticipated DC Plan:  [place]  CM consult      [facility] Choice  Resumption Of Svcs/PTA Provider   Choice offered to / List presented to:  C-1 Patient   DME arranged  IV PUMP/EQUIPMENT      DME agency  [place] arranged  HH-1 RN  [place].   Status of service:  Completed, signed off Medicare Important Message given?   (If response is "NO", the following Medicare IM given date fields will be blank) Date Medicare IM given:   Date Additional Medicare IM given:    Discharge Disposition:  [place]  Per UR Regulation:  Reviewed for med. necessity/level of care/duration of stay  If discussed at [place] of Stay Meetings, dates discussed:   04/20/2013  04/22/2013    Comments:  04/24/13 14:00 CM spoke with [facility] pharmacy who has IV prescription.  Last run of cefipime to be at 14:00.  Fernando Salinas notified of last run and first run at home to be at 22:00. No other CM needs were communicated.  Mariane Masters, BSN, [place].  04/22/13 JULIE AMERSON,RN,BSN [phone] PT'S HOSP STAY COMPLICATED BY SBO.  ADVANCING DIET.  PT PROGRESSING WELL.

## 2013-04-24 NOTE — Discharge Summary (Signed)
[facility]  Physician Discharge Summary   Patient ID: [name] MRN: [id number] DOB/AGE: 12-30-1945 68 y.o.  Admit date: 04/12/2013 Discharge date: 04/24/2013  PCP: Adella Hare, MD  DISCHARGE DIAGNOSES:  Active Problems:   HYPERTENSION   Rheumatoid arthritis(714.0)   Wound infection after surgery   Chronic use of steroids   Protein-calorie malnutrition, severe   RECOMMENDATIONS FOR OUTPATIENT FOLLOW UP: 1. Consider referral to [facility] OP speech therapy for hoarseness. See below.  DISCHARGE CONDITION: fair  Diet recommendation: Low Sodium  Filed Weights   04/12/13 1603  Weight: 62.143 kg (137 lb)    INITIAL HISTORY: Pt is a 68 y.o. male with hx of RA on chronic steroids for several years, hx of HTN, COPD, posterior lumbar fusion, post op lumbar wound infection with pseudomonas, s/p debridement, on Cefepime via PICC line, presented to Dr Derek Mound office, found to have fever and hypotension, and sent to the ER for further work up and Tx. Picc was removed and tip cultured, BC obtained, increase Cefepime to max allowable dose, give steroid and IVF, and hospitalist was asked to admit him.   Consultations:  ID  Neurosurgery  General Surgery  Procedures:  None  HOSPITAL COURSE:   .Hypotension/Sepsis  Was likely secondary to SBO and possible wound infection in lumbar area. This resolved with IVF. Adrenal insufficiency was less likely as serum cortisols were normal. He is status post PICC line removal. Catheter tip and blood cultures so far with no growth to date. Cefepime was continued. ID consulted on this patient. Patient to continue cefepime until January 24. Cultures remained with no growth to date and PICC has been placed again.   .SBO  He was experiencing pain in abdomen with nausea and vomiting. CT abd 1/12 with sbo with ?adhesion vs mass. NG was placed and he was kept NPO. General surgery was consulted. NG removed by surgery 1/14. Diet advanced.  Patient with BM's. SBO appears to have resolved. He is tolerating his diet without difficulty.  .Status post multilevel lumbar fusion with probable wound infection  Status post I&D on 12/11. Was being followed by neurosurgery. Cultures with no growth. Continue pain management. Cefepime till 1/24.  Marland KitchenRecent Pseudomonas urinary tract infection  Continue cefepime   .Rheumatoid arthritis (714.0)  Continue home medications. Can change home dose of Prednisone.   Marland KitchenHYPERTENSION  BP was initially low but then recovered and he became hypertensive. His home medications were resumed. BP is stable currently.   .Dehydration/Hyponatremia/Hyponatremia.  Improved with hydration. Replete K.   . Chronic steroid use  For RA. Continue home dose.  [name] Hoarseness  -Seen by ENT on 1/8. Appreciate Dr.Gore's input>> no vocal cord injury. Speech therapy consulted as recommended>> pt referred to [facility] outpatient speech therapy. Can be pursued as OP.   Marland KitchenRLE infiltrate vs consolidation  Pt assymptomatic>> no cough or SOB, more likely ATX. Incentive spirometry was initiated.  Patient has improved. He was seen by PT and OT. Home health will be resumed. Home antibiotics to be continued. He is stable for discharge. Wife was sick with flu but is afebrile. He should be able to go home. Doesn't need prophylaxis as he was in the hospital while she was sick at home.   PERTINENT LABS:  The results of significant diagnostics from this hospitalization (including imaging, microbiology, ancillary and laboratory) are listed below for reference.    Microbiology: Blood culture and PICC tip cultures were negative.  Labs: Basic Metabolic Panel:  Recent Labs Lab 04/18/13 0447  04/21/13 1502 04/22/13 0433 04/23/13 0434 04/24/13 0435  NA 137 133* 138 141 138  K 3.7 3.8 3.3* 3.6* 3.4*  CL 97 97 101 105 101  CO2 29 23 28 27 28   GLUCOSE 88 257* 75 91 79  BUN 9 7 6 6  5*  CREATININE 0.47* 0.48* 0.42* 0.51 0.49*  CALCIUM 8.4  8.1* 7.9* 7.9* 7.9*    Recent Labs Lab 04/18/13 0447  LIPASE 28   CBC:  Recent Labs Lab 04/22/13 0433  WBC 7.4  HGB 9.8*  HCT 30.1*  MCV 89.1  PLT 239   CBG:  Recent Labs Lab 04/23/13 0609 04/23/13 1127 04/23/13 1719 04/23/13 2143 04/24/13 0627  GLUCAP 87 130* 142* 134* 71     IMAGING STUDIES Dg Chest 2 View  04/12/2013   CLINICAL DATA:  Wound infection  EXAM: CHEST  2 VIEW  COMPARISON:  March 22, 2013  FINDINGS: There is stable elevation of the right hemidiaphragm. There is patchy atelectasis in the right base. Lungs elsewhere are clear. Heart size and pulmonary vascularity are normal. No adenopathy.  Central catheter tip is at the cavoatrial junction. No pneumothorax. 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 There is postoperative change in the lumbar spine.  IMPRESSION: Stable elevation of the right hemidiaphragm. Persistent right base atelectasis. No new opacity seen.   Electronically Signed   By: Lowella Grip M.D.   On: 04/12/2013 23:04   Dg Abd 1 View  04/22/2013   CLINICAL DATA:  Abdominal pain and history of obstruction.  EXAM: ABDOMEN - 1 VIEW  COMPARISON:  04/19/2013 and 04/21/2013  FINDINGS: The nasogastric tube has been removed. There is bowel gas within the small and large bowel. Stool in the rectum. Pedicle screw and rod fixation in the lumbar spine is again noted.  IMPRESSION: Nonspecific bowel gas pattern.  Removal of nasogastric tube.   Electronically Signed   By: Markus Daft M.D.   On: 04/22/2013 07:55   Ct Abdomen Pelvis W Contrast  04/19/2013   CLINICAL DATA:  Abdominal pain. Post appendectomy and inguinal hernia repair.  EXAM: CT ABDOMEN AND PELVIS WITH CONTRAST  TECHNIQUE: Multidetector CT imaging of the abdomen and pelvis was performed using the standard protocol following bolus administration of intravenous contrast.  CONTRAST:  144m OMNIPAQUE IOHEXOL 300 MG/ML  SOLN  COMPARISON:  08/11/2012 CT.  FINDINGS: Gas and fluid-filled dilated  small bowel loops to level of the right lower quadrant where there is narrowing of distal small bowel loops with mild circumferential thickening which may represent the presence of an adhesion/stricture. Although less likely, primary mass is not excluded. Ascites without evidence of free intraperitoneal air.  Small fatty containing right inguinal hernia.  Right lower lobe consolidation may represent infiltrate and/ atelectasis. Recommend followup until clearance. Elevated right hemidiaphragm. This was noted previously.  Minimal amount of fluid inferior aspect left major fissure.  Right central line tip caval atrial junction/proximal right atrium.  Heart size within normal limits. Prominent coronary artery calcifications.  Atherosclerotic type changes of the abdominal aorta with prominent calcified plaque and mild narrowing also involves the descending thoracic aorta, abdominal aortic branch vessels and iliac/femoral arteries. Prominent narrowing superior mesenteric artery and moderate narrowing celiac artery. 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  Prominent sigmoid and descending colon diverticulosis.  Interval lumbar spine surgery with fusion L1 through S1. No solid bony fusion noted at the L1-2, L2-3, L3-4 and L5-S1 level. Anterior slippage of L4. The right the L1 pedicle screw does not enter the  pedicle, located laterally with surrounding loosening. Loosening left L1 pedicle screws suspected. The left L1 pedicle screw partially enters the left aspect of the canal. L3 pedicle screws partially enter canal more notable on the right. Right L4 pedicle screw partially enters the canal. L5 pedicle screws noted along the lateral aspect of the pedicle traversing the lateral anterior cortex. S1 pedicle screws traverse the anterior cortex. Prominent calcified structure posterior to the S1 vertebral body greater to left of midline.  Bilateral renal lesions larger ones which are cysts whereas some of the small ones cannot be  adequately characterize. Large right parapelvic cyst.  No calcified gallstone. No worrisome hepatic, splenic, pancreatic or adrenal lesion.  IMPRESSION: Small bowel obstruction with gas and fluid-filled dilated small bowel loops to level of the right lower quadrant where there is narrowing of distal small bowel loops with mild circumferential thickening which may represent the presence of an adhesion/stricture. Although less likely, primary mass is not excluded. Ascites.  Small fatty containing right inguinal hernia.  Prominent sigmoid and descending colon diverticulosis.  Right lower lobe consolidation may represent infiltrate and/ atelectasis.  Prominent coronary artery calcifications.  Atherosclerotic type changes of the abdominal aorta and aortic branch vessels as detailed above. 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  Interval lumbar spine surgery with fusion L1 through S1. Please see above discussion as bony fusion is not confirmed on the current exam and there may be loosening of pedicle screws.  Please see above.  These results will be called to the ordering clinician or representative by the Radiologist Assistant, and communication documented in the PACS Dashboard.   Electronically Signed   By: Chauncey Cruel M.D.   On: 04/19/2013 10:03   Dg Abd 2 Views  04/21/2013   CLINICAL DATA:  Small bowel obstruction  EXAM: ABDOMEN - 2 VIEW  COMPARISON:  DG ABD 2 VIEWS dated 04/20/2013  FINDINGS: NG tube in the stomach. There are no dilated loops of large or small bowel. No fluid levels. There is gas the rectum. Posterior lumbar fusion noted.  IMPRESSION: Improvement in small bowel obstruction pattern.   tube in stomach   Electronically Signed   By: Suzy Bouchard M.D.   On: 04/21/2013 07:51   Dg Abd 2 Views  04/20/2013   CLINICAL DATA:  Small-bowel obstruction  EXAM: ABDOMEN - 2 VIEW  COMPARISON:  04/20/2011  FINDINGS: Gastric tube extends to the stomach into the duodenum.  Distended small bowel loops in the right  abdomen with air-fluid levels similar to prior studies and suspicious for partial small bowel obstruction. Colon is decompressed.  Elevated right hemidiaphragm. The right diaphragm was not included on the upright view, therefore the study does not evaluate for free intraperitoneal air.  Lumbar fusion with loosening of the L1 is pedicle screw on the left.  IMPRESSION: Dilated small bowel with air-fluid levels consistent with partial small bowel obstruction. NG tube in the duodenum.   Electronically Signed   By: Franchot Gallo M.D.   On: 04/20/2013 08:31   Dg Abd Portable 1v  04/19/2013   CLINICAL DATA:  NG tube placement.  EXAM: PORTABLE ABDOMEN - 1 VIEW  COMPARISON:  04/19/2013.  FINDINGS: Nasogastric tube tip gastric pylorus level.  Gas distended small bowel loops.  Please see recent CT report.  Degenerative changes thoracic spine on lumbar spine post fusion L1 through S1.  IMPRESSION: Nasogastric tube tip gastric pylorus level.  Gas distended small bowel loops.  Please see recent CT report.   Electronically Signed  By: Chauncey Cruel M.D.   On: 04/19/2013 17:21   Dg Abd Portable 1v  04/19/2013   CLINICAL DATA:  Nasogastric tube placement.  EXAM: PORTABLE ABDOMEN - 1 VIEW  COMPARISON:  04/19/2013.  FINDINGS: There is no nasogastric tube visualized. Telemetry leads are present over the abdomen. Gaseous distension of small bowel, with dilation in the right upper quadrant and pelvis. Postoperative changes of lumbar spine surgery.  IMPRESSION: No visible nasogastric tube on this abdominal radiograph. Consider chest radiograph to assess location or Re-insert and repeat abdominal film.   Electronically Signed   By: Dereck Ligas M.D.   On: 04/19/2013 16:15   Dg Abd Portable 1v  04/17/2013   CLINICAL DATA:  Abdominal pain  EXAM: PORTABLE ABDOMEN - 1 VIEW  COMPARISON:  None.  FINDINGS: Postsurgical changes are noted in the lower lumbar spine. Scattered large and small bowel gas is noted although no to obstructive  changes are seen. No free air is noted. No abnormal mass or abnormal calcifications are seen.  IMPRESSION: Postoperative changes.  Scattered large and small bowel gas without obstructive change.   Electronically Signed   By: Inez Catalina M.D.   On: 04/17/2013 20:25    DISCHARGE EXAMINATION: Filed Vitals:   04/23/13 1035 04/23/13 1345 04/23/13 2146 04/24/13 0405  BP:  149/82 170/97 125/74  Pulse:  90 72 79  Temp:  98.4 F (36.9 C) 99.3 F (37.4 C) 98.4 F (36.9 C)  TempSrc:  Oral Oral Oral  Resp:  17 18 17   Height:      Weight:      SpO2: 93% 97% 97% 91%   General appearance: alert, cooperative, appears stated age and no distress Resp: clear to auscultation bilaterally Cardio: regular rate and rhythm, S1, S2 normal, no murmur, click, rub or gallop GI: soft, non-tender; bowel sounds normal; no masses,  no organomegaly Neurologic: No focal deficits.  DISPOSITION: Home with home health  Discharge Orders   Future Appointments Provider Department Dept Phone   04/29/2013 11:00 AM Truman Hayward, MD [facility] Infectious Disease [phone]   07/16/2013 12:00 PM Star Age, MD [facility] [phone]   Future Orders Complete By Expires   Diet - low sodium heart healthy  As directed    Increase activity slowly  As directed       ALLERGIES:  Allergies  Allergen Reactions  . Morphine And Related Shortness Of Breath, Nausea And Vomiting and Swelling  . Penicillins Hives, Shortness Of Breath and Rash    REACTION: hives, breathing problems  . Percocet [Oxycodone-Acetaminophen] Anxiety    Current Discharge Medication List    START taking these medications   Details  dextrose 5 % SOLN 50 mL with ceFEPIme 2 G SOLR 2 g Inject 2 g into the vein every 8 (eight) hours. Till January 24th 2015. Qty: 24 g, Refills: 0    polyethylene glycol (MIRALAX / GLYCOLAX) packet Take 17 g by mouth daily as needed for moderate constipation. Qty: 14 each,  Refills: 1      CONTINUE these medications which have CHANGED   Details  docusate sodium (COLACE) 100 MG capsule Take 1 capsule (100 mg total) by mouth 2 (two) times daily. Qty: 60 capsule, Refills: 0    HYDROmorphone (DILAUDID) 4 MG tablet Take 1 tablet (4 mg total) by mouth 2 (two) times daily as needed for moderate pain or severe pain. Qty: 15 tablet, Refills: 0      CONTINUE these  medications which have NOT CHANGED   Details  Aclidinium Bromide (TUDORZA PRESSAIR) 400 MCG/ACT AEPB Inhale 1 puff into the lungs 2 (two) times daily. Qty: 1 each, Refills: 11   Associated Diagnoses: Obstructive chronic bronchitis with exacerbation    adalimumab (HUMIRA) 40 MG/0.8ML injection Inject 40 mg into the skin every 14 (fourteen) days.    ALPRAZolam (XANAX) 0.5 MG tablet Take 1 tablet (0.5 mg total) by mouth 3 (three) times daily as needed for anxiety or sleep. Qty: 90 tablet, Refills: 5    aspirin (ASPIRIN EC) 81 MG EC tablet Take 81 mg by mouth every morning.     celecoxib (CELEBREX) 200 MG capsule Take 200 mg by mouth 2 (two) times daily.    cholecalciferol (VITAMIN D) 1000 UNITS tablet Take 1 tablet (1,000 Units total) by mouth daily. Qty: 100 tablet, Refills: 3    colchicine 0.6 MG tablet Take 0.6 mg by mouth 2 (two) times daily.    diazepam (VALIUM) 5 MG tablet Take 1 tablet (5 mg total) by mouth every 6 (six) hours as needed for muscle spasms. Qty: 30 tablet, Refills: 0    FLUoxetine (PROZAC) 40 MG capsule Take 40 mg by mouth 2 (two) times daily.    fluticasone (FLONASE) 50 MCG/ACT nasal spray Place 2 sprays into both nostrils daily.    furosemide (LASIX) 20 MG tablet Take 20 mg by mouth daily.    gabapentin (NEURONTIN) 300 MG capsule Take 300-600 mg by mouth 4 (four) times daily. 600 mg every morning, 300 mg at noon, 600 mg at bedtime, and 300 mg at 1 AM    hydrochlorothiazide (HYDRODIURIL) 25 MG tablet Take 25 mg by mouth daily before breakfast.     losartan (COZAAR) 50 MG  tablet Take 50 mg by mouth daily.    Melatonin ([employer] MELATONIN) 5 MG TABS Take 5 mg by mouth at bedtime.     Nutritional Supplements (ENSURE COMPLETE PO) Take 0.5 Cans by mouth 2 (two) times daily.    OxyCODONE (OXYCONTIN) 40 mg T12A 12 hr tablet Take 40 mg by mouth every 8 (eight) hours as needed (pain).    pantoprazole (PROTONIX) 40 MG tablet Take 40 mg by mouth 2 (two) times daily.     Polyethyl Glycol-Propyl Glycol (SYSTANE ULTRA) 0.4-0.3 % SOLN Place 2 drops into both eyes 2 (two) times daily as needed (dry eyes).     potassium chloride SA (K-DUR,KLOR-CON) 20 MEQ tablet Take 20 mEq by mouth daily.    predniSONE (DELTASONE) 5 MG tablet Take 5 mg by mouth every evening.     simvastatin (ZOCOR) 40 MG tablet Take 40 mg by mouth daily. At lunch    traZODone (DESYREL) 100 MG tablet Take 100 mg by mouth at bedtime. Take just before bedtime    zolpidem (AMBIEN) 5 MG tablet Take 1 tablet (5 mg total) by mouth at bedtime as needed for sleep. Qty: 30 tablet, Refills: 0       Follow-up Information   Follow up with Adella Hare, MD. Schedule an appointment as soon as possible for a visit in 2 weeks.   Specialty:  Internal Medicine   Contact information:   [address] [place] [address] [phone]       Follow up with Floyce Stakes, MD. (see as scheduled or call for appointment)    Specialty:  Neurosurgery   Contact information:   [address] [place] [address] [place] [address] [phone]  TOTAL DISCHARGE TIME: 35 mins  [facility] Pager [phone]  04/24/2013, 8:55 AM

## 2013-04-24 NOTE — Progress Notes (Signed)
Discharged to home with family office visits in place teaching done Discharged to home with family office visits in place teaching done

## 2013-04-29 ENCOUNTER — Inpatient Hospital Stay: Payer: Medicare Other | Admitting: Infectious Disease

## 2013-04-29 ENCOUNTER — Other Ambulatory Visit: Payer: Self-pay | Admitting: *Deleted

## 2013-04-29 ENCOUNTER — Telehealth: Payer: Self-pay | Admitting: *Deleted

## 2013-04-29 DIAGNOSIS — T8149XA Infection following a procedure, other surgical site, initial encounter: Secondary | ICD-10-CM

## 2013-04-29 MED ORDER — CIPROFLOXACIN HCL 500 MG PO TABS: 500.0000 mg | ORAL_TABLET | Freq: Two times a day (BID) | ORAL | Status: DC

## 2013-04-29 NOTE — Telephone Encounter (Signed)
Patient's wife called to cancel today's appointment stating patient was too weak. She said his wound looks good but she is unable to get patient into the car herself. Patient's appt changed to next available hospital follow up on 05/25/13. However patient's antibiotics will end before then; he only has 2 weeks left. She will have the [place] nurse take pictures of the wound today and get advice as to if patient needs to be in a skilled nursing facility so he can be transported. Please advise if it is ok to wait until 05/25/13. Myrtis Hopping

## 2013-04-29 NOTE — Telephone Encounter (Signed)
Patient notified

## 2013-04-29 NOTE — Telephone Encounter (Signed)
I cannot do direct admit for them

## 2013-04-29 NOTE — Telephone Encounter (Signed)
[facility] and patient notified. [name]

## 2013-04-29 NOTE — Telephone Encounter (Signed)
Debbie called states pt rash all over body, is talking out of his head, and unable to ambulate alone.  Please advise

## 2013-04-29 NOTE — Telephone Encounter (Signed)
Patient's wife already called Dr. Daylene Posey office because patient has an all over body rash and delirium, (not making sense). They advised him to go the the ED. Wife does not want to do this because last time they waited 5 hours before getting a room. If he needs to be admitted she is requesting a direct admit. Not sure this can be done because patient was not seen. Myrtis Hopping

## 2013-04-29 NOTE — Telephone Encounter (Signed)
Much as I hate to say this: with his recent hospitalizations and the current symptoms, suggestive of possible drug reaction or infection, and delerium with inability to ambulate - EMS to take to ED at [facility]. Sorry.

## 2013-04-29 NOTE — Telephone Encounter (Signed)
Once his IV meds are DONE have them pul the PICC and start cipro 575m bid until [place]

## 2013-04-29 NOTE — Telephone Encounter (Signed)
Left detailed message on VM of MDs message.

## 2013-05-01 ENCOUNTER — Encounter: Payer: Self-pay | Admitting: Internal Medicine

## 2013-05-01 ENCOUNTER — Ambulatory Visit (INDEPENDENT_AMBULATORY_CARE_PROVIDER_SITE_OTHER): Payer: Medicare Other | Admitting: Internal Medicine

## 2013-05-01 VITALS — BP 140/90 | HR 80 | Temp 97.9°F | Resp 16 | Wt 144.0 lb

## 2013-05-01 DIAGNOSIS — L270 Generalized skin eruption due to drugs and medicaments taken internally: Secondary | ICD-10-CM | POA: Insufficient documentation

## 2013-05-01 DIAGNOSIS — T3795XA Adverse effect of unspecified systemic anti-infective and antiparasitic, initial encounter: Principal | ICD-10-CM

## 2013-05-01 MED ORDER — HYDROXYZINE HCL 25 MG PO TABS: 25.0000 mg | ORAL_TABLET | Freq: Three times a day (TID) | ORAL | Status: DC | PRN

## 2013-05-01 NOTE — Progress Notes (Signed)
Subjective:    Patient ID: [name], male    DOB: 1945/10/29, 68 y.o.   MRN: [id number]  Rash This is a new problem. Episode onset: 3 days. The problem is unchanged. The rash is diffuse. The rash is characterized by itchiness, dryness and redness. He was exposed to a new medication. Associated symptoms include fatigue. Pertinent negatives include no anorexia, congestion, cough, diarrhea, eye pain, facial edema, fever, joint pain, nail changes, rhinorrhea, shortness of breath, sore throat or vomiting. Past treatments include oral steroids, topical steroids and antihistamine. The treatment provided mild relief.      Review of Systems  Constitutional: Positive for fatigue. Negative for fever, chills, diaphoresis and appetite change.  HENT: Negative for congestion, facial swelling, rhinorrhea, sinus pressure, sneezing, sore throat, trouble swallowing and voice change.   Eyes: Negative.  Negative for pain.  Respiratory: Negative.  Negative for cough, choking, shortness of breath, wheezing and stridor.   Cardiovascular: Negative.  Negative for chest pain, palpitations and leg swelling.  Gastrointestinal: Negative.  Negative for nausea, vomiting, abdominal pain, diarrhea, constipation and anorexia.  Endocrine: Negative.   Genitourinary: Negative.   Musculoskeletal: Positive for back pain. Negative for arthralgias, gait problem, joint pain, joint swelling, myalgias, neck pain and neck stiffness.       Mild swelling in his RUE, he has a PICC line there. HHC was to take out the [facility] line today but since he is here he missed the appt with the [facility] nurse so I am asked to remove the PICC line today.  Skin: Positive for rash. Negative for nail changes, color change, pallor and wound.  Allergic/Immunologic: Negative.   Neurological: Negative.  Negative for dizziness and light-headedness.  Hematological: Negative.   Psychiatric/Behavioral: Negative.        Objective:   Physical Exam  Vitals  reviewed. Constitutional: He is oriented to person, place, and time. He appears well-developed and well-nourished.  Non-toxic appearance. He does not have a sickly appearance. He does not appear ill. No distress.  HENT:  Head: Normocephalic and atraumatic.  Eyes: Conjunctivae are normal. Right eye exhibits no discharge. Left eye exhibits no discharge. No scleral icterus.  Neck: Normal range of motion. Neck supple. No JVD present. No tracheal deviation present. No thyromegaly present.  Cardiovascular: Normal rate, regular rhythm, normal heart sounds and intact distal pulses.  Exam reveals no gallop and no friction rub.   No murmur heard. Pulmonary/Chest: Effort normal and breath sounds normal. No stridor. No respiratory distress. He has no wheezes. He has no rales. He exhibits no tenderness.  Abdominal: Soft. Bowel sounds are normal. He exhibits no distension. There is no tenderness. There is no rebound and no guarding.  Musculoskeletal: Normal range of motion. He exhibits no edema and no tenderness.       Right upper arm: He exhibits swelling and deformity. He exhibits no tenderness, no bony tenderness, no edema and no laceration.  There is very mild diffuse swelling in the RUE, the PICC line sight looks good with no erythema or exudate. There is no palpable cord, tenderness, or LAD in the RUE. The PICC line was easily removed with no blood loss.  Lymphadenopathy:    He has no cervical adenopathy.    He has no axillary adenopathy.       Right axillary: No pectoral and no lateral adenopathy present.       Left axillary: No pectoral and no lateral adenopathy present.      Right: No supraclavicular  adenopathy present.       Left: No supraclavicular adenopathy present.  Neurological: He is oriented to person, place, and time.  Skin: Skin is warm, dry and intact. Rash noted. No abrasion, no bruising, no ecchymosis, no laceration, no petechiae and no purpura noted. Rash is macular and papular. Rash is  not nodular, not pustular, not vesicular and not urticarial. He is not diaphoretic. There is erythema. No cyanosis. No pallor. Nails show no clubbing.     There are diffuse non-blanching maculo-papular erythematous lesions . There are no targets, vesicles, erythroderma, scales, pustules, or streaking. There is no involvement of the muscous membranes, palms, or soles.      Lab Results  Component Value Date   WBC 7.4 04/22/2013   HGB 9.8* 04/22/2013   HCT 30.1* 04/22/2013   PLT 239 04/22/2013   GLUCOSE 79 04/24/2013   CHOL 100 10/15/2010   TRIG 75.0 10/15/2010   HDL 36.70* 10/15/2010   LDLCALC 48 10/15/2010   ALT 11 04/12/2013   AST 16 04/12/2013   NA 138 04/24/2013   K 3.4* 04/24/2013   CL 101 04/24/2013   CREATININE 0.49* 04/24/2013   BUN 5* 04/24/2013   CO2 28 04/24/2013   TSH 0.220* 04/13/2013   PSA 1.95 10/15/2010   INR 1.13 03/04/2012   HGBA1C 5.1 03/21/2013       Assessment & Plan:

## 2013-05-01 NOTE — Assessment & Plan Note (Signed)
I think he has drug allergy to the cefipime that he has been receiving for several weeks, fortunately he has completed the course so no further exposure is expected and he should start to improve quickly. For now, he will cont the topical steroids and I have asked him to upgrade the antihistamine to hydroxyzine. The PICC line was removed, he will elevate his RUE and if the swelling persists he was asked to let me know so that I can order an U/S to see if there is a clot in the RUE - for now I think the swelling indicates the mere presence of the PICC line (without any complications) which has been removed so I expect the swelling to resolve. I do not see any evidence of an infection in the RUE.

## 2013-05-01 NOTE — Patient Instructions (Signed)
Drug Rash Skin reactions can be caused by several different drugs. Allergy to the medicine can cause itching, hives, and other rashes. Sun exposure causes a red rash with some medicines. Mononucleosis virus can cause a similar red rash when you are taking antibiotics. Sometimes, the rash may be accompanied by pain. The drug rash may happen with new drugs or with medicines that you have been taking for a while. The rash cannot be spread from person to person. In most cases, the symptoms of a drug rash are gone within a few days of stopping the medicine. Your rash, including hives (urticaria), is most likely from the following medicines:  Antibiotics or antimicrobials.  Anticonvulsants or seizure medicines.  Antihypertensives or blood pressure medicines.  Antimalarials.  Antidepressants or depression medicines.  Antianxiety drugs.  Diuretics or water pills.  Nonsteroidal anti-inflammatory drugs.  Simvastatin.  Lithium.  Omeprazole.  Allopurinol.  Pseudoephedrine.  Amiodarone.  Packed red blood cells, when you get a blood transfusion.  Contrast media, such as when getting an imaging test (CT or CAT scan). This drug list is not all inclusive, but drug rashes have been reported with all the medicines listed above.Your caregiver will tell you which medicines to avoid. If you react to a medicine, a similar or worse reaction can occur the next time you take it. If you need to stop taking an antibiotic because of a drug rash, an alternative antibiotic may be needed to get rid of your infection. Antihistamine or cortisone drugs may be prescribed to help relieve your symptoms. Stay out of the sun until the rash is completely gone.  Be sure to let your caregiver know about your drug reaction. Do not take this medicine in the future. Call your caregiver if your drug rash does not improve within 3 to 4 days. SEEK IMMEDIATE MEDICAL CARE IF:   You develop breathing problems, swelling in the  throat, or wheezing.  You have weakness, fainting, fever, and muscle or joint pains.  You develop blisters or peeling of skin, especially around the mouth. Document Released: 05/02/2004 Document Revised: 06/17/2011 Document Reviewed: 02/11/2008 [facility] Patient Information 2014 [place].

## 2013-05-01 NOTE — Progress Notes (Signed)
Pre-visit discussion using our clinic review tool. No additional management support is needed unless otherwise documented below in the visit note.  

## 2013-05-02 ENCOUNTER — Other Ambulatory Visit: Payer: Self-pay | Admitting: Internal Medicine

## 2013-05-03 ENCOUNTER — Telehealth: Payer: Self-pay

## 2013-05-03 ENCOUNTER — Telehealth: Payer: Self-pay | Admitting: *Deleted

## 2013-05-03 NOTE — Telephone Encounter (Signed)
Ok to call in order for speech  Therapy eval and treatment

## 2013-05-03 NOTE — Telephone Encounter (Signed)
The wife called and is hoping to get an order in for speech therapy.  The wife states that Clyde Canterbury is the therapist that is requesting the verbal order.  She can be reached and given the order at [phone]  Callback - Wife [name]) [phone]

## 2013-05-03 NOTE — Telephone Encounter (Signed)
Notified Juliann Pulse via voicemail okay for speech therapy. I also let [name] know I left the message with Juliann Pulse for the okay for this.

## 2013-05-03 NOTE — Telephone Encounter (Signed)
Triage Record Num: [id number] Operator: Amada Kingfisher Patient Name: [name] Call Date & Time: 05/01/2013 9:17:06AM Patient Phone: [phone] PCP: Adella Hare Patient Gender: Male PCP Fax : [phone] Patient DOB: 26-Jan-1946 Practice Name: [facility] Reason for Call: Caller: [place]/Patient; PCP: Adella Hare (Adults only); CB#: [phone]; Call regarding Rash/Hives; onset 04/29/13; pt is hoarse and confused; he is telling about being in the hosp at [facility] this wk, but according to epic, that was wks ago; found notes in chart where pt called office about the rash and was confused at that time; was advised by Dr.Norins to go back to the ED; says hives are worse; c/o itching; says it kept him up all night; afebrile; informed him that previous note said to use the ED and if sxs are worse, will need to do the same; wife requests appt at office; appt scheduled for today at 1045 with Dr.Jones Protocol(s) Used: Office Note Recommended Outcome per Protocol: Information Noted and Sent to Office Reason for Outcome: Caller information to office Care Advice: ~ 01/

## 2013-05-05 ENCOUNTER — Encounter: Payer: Self-pay | Admitting: Internal Medicine

## 2013-05-05 ENCOUNTER — Ambulatory Visit (INDEPENDENT_AMBULATORY_CARE_PROVIDER_SITE_OTHER): Payer: Medicare Other | Admitting: Internal Medicine

## 2013-05-05 VITALS — BP 120/90 | HR 114 | Temp 98.7°F | Wt 141.0 lb

## 2013-05-05 DIAGNOSIS — R296 Repeated falls: Secondary | ICD-10-CM

## 2013-05-05 DIAGNOSIS — Z9181 History of falling: Secondary | ICD-10-CM

## 2013-05-05 DIAGNOSIS — E46 Unspecified protein-calorie malnutrition: Secondary | ICD-10-CM

## 2013-05-05 DIAGNOSIS — Z4889 Encounter for other specified surgical aftercare: Secondary | ICD-10-CM

## 2013-05-05 NOTE — Progress Notes (Signed)
Subjective:    Patient ID: [name], male    DOB: 08-22-1945, 68 y.o.   MRN: [id number]  HPI [name] presents for post-hospital follow up. He has had a stormy course following his multi-level spinal fusion - lumbar: he had pain control issues, subsequent wound infection requiring 6 weeks of IV antibiotic, complications of slow wound healing, weight loss, episodes of confusion (a problem that predates the last surgery and evaluated by neurology), rash thought to be a drug reaction and dysphonia. His records have been reviewed.   At his last hospitalization he was seen in consultation by Dr. Louis Matte who performed laryngoscopy that was normal. SLP for speech training was recommended. The SLP, Ms. Couter, recommended outpatient consultation at [facility] department of speech therapy.  [name] has had 5- falls since his recent discharge all of which involved loss of balance or tripping over his walker but no LOC episodes. He has been getting out of bed to the BR at night, he has been trying to carry food or beverages while also using his rolling walker with rear skids. Fortunately he has had no injury. He has had poor po intake. His needs are greater at home than anticipated which has been quite a strain for his wife. He has had home health services.  This past Saturday he was seen in clinic by Dr. Ronnald Ramp: the patient's right are had appeared swollen and he had a worsening rash. Note reviewed. He had the PICC line removed and was informed taht the rash looked like a drug rash to the i\IV antibiotic which has now been stopped.   Past Medical History  Diagnosis Date  . Hx of colonic polyps   . Diverticulosis   . Hypertension   . Osteoarthritis   . 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 CAD (coronary artery disease)     minimal coronary plaque in the LAD and right coronary system. PCI of a 95% obtuse marginal lesion w/ resultant spiral  dissection requiring drug-eluting stent placement. 7-06. Last nuclear stress 11-17-06 fixed anterior/ inferior defect, no inducible ischemia, EF 81%  . Allergic rhinitis   . Hx of colonoscopy   . RA (rheumatoid arthritis)   . COPD (chronic obstructive pulmonary disease)   . PONV (postoperative nausea and vomiting)   . Dysrhythmia 01-24-12    past hx. A.Fib x1 episode-responded to med.  . Depression   . Anxiety     pt. feels its related to the medicine that he takes   . GERD (gastroesophageal reflux disease)    Past Surgical History  Procedure Laterality Date  . Appendectomy    . Inguinal hernia repair  2008    Right, left remotely  . Total knee arthroplasty      right knee partial 2002  . Tonsillectomy    . Shoulder preplacement  2008    left, partial  . Ptca w/ coated stent lad      Cx secondary to tear  . Foot surgery  11-08    for removal of bone spurs- right foot  . Reconstruction of ankle  6-09    Right- [facility]  . L4-5 diskectomy w/ fusion, cage placement and rods  12-10    [place]  . Partial knee arthroplasty      left  . Total shoulder replacement      right  . Joint replacement    . Cataract extraction,  bilateral  01-24-12    Bilateral  . Orif shoulder fracture  02/06/2012    Procedure: OPEN REDUCTION INTERNAL FIXATION (ORIF) SHOULDER FRACTURE;  Surgeon: Nita Sells, MD;  Location: [facility];  Service: Orthopedics;  Laterality: Left;  ORIF of a Left Shoulder Fracture with  Iliac Crest Bone Graft aspiration   . Harvest bone graft  02/06/2012    Procedure: HARVEST ILIAC BONE GRAFT;  Surgeon: Nita Sells, MD;  Location: [facility];  Service: Orthopedics;;  bone marrow aspirqation   . Hardware removal  03/09/2012    Procedure: HARDWARE REMOVAL;  Surgeon: Nita Sells, MD;  Location: [place];  Service: Orthopedics;  Laterality: Left;  Hardware Removal from Left Shoulder  . Cardiac catheterization  2006    coronary stents- no  problems  . Shoulder arthroscopy  2013  . Carpal tunnel release      R hand  . Ankle surgery Right 2008    at [facility], replacement   . Dg toes*r*      toe surgery- 07/2012  . Posterior lumbar fusion 4 level N/A 03/02/2013    Procedure: Lumbar One to Sacral One Posterior lumbar interbody fusion;  Surgeon: Floyce Stakes, MD;  Location: [place] [facility];  Service: Neurosurgery;  Laterality: N/A;  L1 to S1 Posterior lumbar interbody fusion  . Lumbar wound debridement N/A 03/17/2013    Procedure: Incision and drainage of superficial lumbar wound;  Surgeon: Floyce Stakes, MD;  Location: [place] [facility];  Service: Neurosurgery;  Laterality: N/A;  Incision and drainage of superficial lumbar wound   Family History  Problem Relation Age of Onset  . Uterine cancer Mother     survivor  . Macular degeneration Mother   . Other Mother     ankle edema  . Lung cancer Mother   . Cancer Mother     ovarian, lung  . Coronary artery disease Father   . Hypertension Father   . Prostate cancer Father   . Colon polyps Father   . Cancer Father     prostate  . Thyroid disease Sister   . Heart attack Brother   . Hyperlipidemia Brother   . Other Brother     Schizophrenic  . Diabetes Neg Hx   . Colon cancer Neg Hx   . Esophageal cancer Neg Hx   . Rectal cancer Neg Hx   . Stomach cancer Neg Hx   . Coronary artery disease Maternal Aunt   . Heart attack Maternal Aunt   . Rheum arthritis Sister   . Hemochromatosis Sister    History   Social History  . Marital Status: Divorced    Spouse Name: N/A    Number of Children: 2  . Years of Education: 16   Occupational History  . 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 Smoking status: Never Smoker   . Smokeless tobacco: Never Used  . Alcohol Use: No  . Drug Use: No  . Sexual Activity: Yes    Partners: Female   Other Topics Concern  . Not on file   Social History Narrative   [employer] [place]. Married 1972-53yr  seperated/divorced. Engaged April '11. 1 son '74; step-daughter '70. 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 During '11 discovered an [employer]- $500,000+ loss. Is handling this OK - will keep the  business running.    Current Facility-Administered Medications on File Prior to Visit  Medication Dose Route Frequency Provider Last Rate Last Dose  . loratadine (CLARITIN) tablet 10 mg  10 mg Oral Daily Hennie Duos, MD       Current Outpatient Prescriptions on File Prior to Visit  Medication Sig Dispense Refill  . Aclidinium Bromide (TUDORZA PRESSAIR) 400 MCG/ACT AEPB Inhale 1 puff into the lungs 2 (two) times daily.  1 each  11  . ALPRAZolam (XANAX) 0.5 MG tablet Take 1 tablet (0.5 mg total) by mouth 3 (three) times daily as needed for anxiety or sleep.  90 tablet  5  . aspirin (ASPIRIN EC) 81 MG EC tablet Take 81 mg by mouth every morning.       . celecoxib (CELEBREX) 200 MG capsule Take 200 mg by mouth 2 (two) times daily.      . cholecalciferol (VITAMIN D) 1000 UNITS tablet Take 1 tablet (1,000 Units total) by mouth daily.  100 tablet  3  . ciprofloxacin (CIPRO) 500 MG tablet Take 1 tablet (500 mg total) by mouth 2 (two) times daily.  60 tablet  0  . colchicine 0.6 MG tablet Take 0.6 mg by mouth 2 (two) times daily.      [name] dextrose 5 % SOLN 50 mL with ceFEPIme 2 G SOLR 2 g Inject 2 g into the vein every 8 (eight) hours. Till January 24th 2015.  24 g  0  . diazepam (VALIUM) 5 MG tablet Take 1 tablet (5 mg total) by mouth every 6 (six) hours as needed for muscle spasms.  30 tablet  0  . docusate sodium (COLACE) 100 MG capsule Take 1 capsule (100 mg total) by mouth 2 (two) times daily.  60 capsule  0  . FLUoxetine (PROZAC) 40 MG capsule Take 40 mg by mouth 2 (two) times daily.      . fluticasone (FLONASE) 50 MCG/ACT nasal spray Place 2 sprays into both nostrils at bedtime.        . furosemide (LASIX) 20 MG tablet Take 20 mg by mouth daily.      [name] gabapentin (NEURONTIN) 300 MG capsule Take 300-600 mg by mouth 4 (four) times daily. 600 mg every morning, 300 mg at noon, 600 mg at bedtime, and 300 mg at 1 AM      . hydrochlorothiazide (HYDRODIURIL) 25 MG tablet Take 25 mg by mouth daily before breakfast.       . HYDROmorphone (DILAUDID) 4 MG tablet Take 1 tablet (4 mg total) by mouth 2 (two) times daily as needed for moderate pain or severe pain.  15 tablet  0  . losartan (COZAAR) 50 MG tablet Take 50 mg by mouth daily.      . Melatonin ([employer] MELATONIN) 5 MG TABS Take 5 mg by mouth at bedtime.       . Nutritional Supplements (ENSURE COMPLETE PO) Take 0.5 Cans by mouth 2 (two) times daily.      . OxyCODONE (OXYCONTIN) 40 mg T12A 12 hr tablet Take 40 mg by mouth 2 (two) times daily as needed (pain).       . pantoprazole (PROTONIX) 40 MG tablet Take 40 mg by mouth 2 (two) times daily.       Vladimir Faster Glycol-Propyl Glycol (SYSTANE ULTRA) 0.4-0.3 % SOLN Place 2 drops into both eyes 2 (two) times daily as needed (dry eyes).       . polyethylene glycol (MIRALAX / GLYCOLAX) packet Take 17 g  by mouth daily as needed for moderate constipation.  14 each  1  . potassium chloride SA (K-DUR,KLOR-CON) 20 MEQ tablet Take 20 mEq by mouth daily.      . predniSONE (DELTASONE) 5 MG tablet Take 5 mg by mouth every evening.       . simvastatin (ZOCOR) 40 MG tablet Take 40 mg by mouth daily with lunch.       . zolpidem (AMBIEN) 5 MG tablet Take 1 tablet (5 mg total) by mouth at bedtime as needed for sleep.  30 tablet  0    Review of Systems System review is negative for any constitutional, cardiac, pulmonary, GI or neuro symptoms or complaints other than as described in the HPI.  He has not had fever or rigors, he has not had LOC    Objective:   Physical Exam Filed Vitals:   05/05/13 1527  BP: 120/90  Pulse: 114  Temp: 98.7 F (37.1 C)   Wt Readings from Last 3 Encounters:    05/06/13 143 lb 4.8 oz (65 kg)  05/05/13 141 lb (63.957 kg)  05/01/13 144 lb (65.318 kg)  Recent max weight 156 lbs June '14  Gen'l- a haggard and chronically ill appearing man in no acute distress HEENT- C&S clear, no oral lesions Neck- supple Nodes - negative at the cervical level. Cor - 2+ radial pulse, RRR Pulm - normal respirations, no rales or wheezes on exam Abd - BS+ Neuro - Awake and alert, speech is a whisper with poor phonation, PERRLA, Able to stand only with assistance and he was unsteady on his feet. It was difficult for him to unbuckle his pants. No focal motor weakness Derm - inspected back wound: lumbar region a 7 cm long wound hat is healing by secondary intention that appear at least 2 cm deep. No frank pus, no malodor, pink granulation appearance but skin edges are rolled and there is 1+ CM tunneling at the cephalad aspect.        Assessment & Plan:

## 2013-05-05 NOTE — Progress Notes (Signed)
Pre visit review using our clinic review tool, if applicable. No additional management support is needed unless otherwise documented below in the visit note. 

## 2013-05-06 ENCOUNTER — Emergency Department (HOSPITAL_COMMUNITY): Payer: Medicare Other

## 2013-05-06 ENCOUNTER — Encounter (HOSPITAL_COMMUNITY): Payer: Self-pay | Admitting: Radiology

## 2013-05-06 ENCOUNTER — Inpatient Hospital Stay (HOSPITAL_COMMUNITY)
Admission: EM | Admit: 2013-05-06 | Discharge: 2013-05-09 | DRG: 917 | Disposition: A | Discharge status: Home Health | Payer: Medicare Other | Attending: Internal Medicine | Admitting: Internal Medicine

## 2013-05-06 ENCOUNTER — Telehealth: Payer: Self-pay

## 2013-05-06 DIAGNOSIS — Z96659 Presence of unspecified artificial knee joint: Secondary | ICD-10-CM

## 2013-05-06 DIAGNOSIS — T1490XA Injury, unspecified, initial encounter: Secondary | ICD-10-CM | POA: Diagnosis not present

## 2013-05-06 DIAGNOSIS — T4271XA Poisoning by unspecified antiepileptic and sedative-hypnotic drugs, accidental (unintentional), initial encounter: Secondary | ICD-10-CM | POA: Diagnosis present

## 2013-05-06 DIAGNOSIS — E860 Dehydration: Secondary | ICD-10-CM | POA: Diagnosis present

## 2013-05-06 DIAGNOSIS — I2510 Atherosclerotic heart disease of native coronary artery without angina pectoris: Secondary | ICD-10-CM

## 2013-05-06 DIAGNOSIS — F3289 Other specified depressive episodes: Secondary | ICD-10-CM | POA: Diagnosis present

## 2013-05-06 DIAGNOSIS — J9600 Acute respiratory failure, unspecified whether with hypoxia or hypercapnia: Secondary | ICD-10-CM | POA: Diagnosis present

## 2013-05-06 DIAGNOSIS — Z4889 Encounter for other specified surgical aftercare: Secondary | ICD-10-CM | POA: Insufficient documentation

## 2013-05-06 DIAGNOSIS — J449 Chronic obstructive pulmonary disease, unspecified: Secondary | ICD-10-CM | POA: Diagnosis present

## 2013-05-06 DIAGNOSIS — T40601A Poisoning by unspecified narcotics, accidental (unintentional), initial encounter: Secondary | ICD-10-CM

## 2013-05-06 DIAGNOSIS — R296 Repeated falls: Secondary | ICD-10-CM | POA: Insufficient documentation

## 2013-05-06 DIAGNOSIS — R634 Abnormal weight loss: Secondary | ICD-10-CM | POA: Diagnosis present

## 2013-05-06 DIAGNOSIS — R4182 Altered mental status, unspecified: Secondary | ICD-10-CM

## 2013-05-06 DIAGNOSIS — Z9861 Coronary angioplasty status: Secondary | ICD-10-CM

## 2013-05-06 DIAGNOSIS — E46 Unspecified protein-calorie malnutrition: Secondary | ICD-10-CM

## 2013-05-06 DIAGNOSIS — M069 Rheumatoid arthritis, unspecified: Secondary | ICD-10-CM

## 2013-05-06 DIAGNOSIS — R109 Unspecified abdominal pain: Secondary | ICD-10-CM | POA: Diagnosis not present

## 2013-05-06 DIAGNOSIS — J4489 Other specified chronic obstructive pulmonary disease: Secondary | ICD-10-CM | POA: Diagnosis present

## 2013-05-06 DIAGNOSIS — T400X1A Poisoning by opium, accidental (unintentional), initial encounter: Principal | ICD-10-CM | POA: Diagnosis present

## 2013-05-06 DIAGNOSIS — IMO0002 Reserved for concepts with insufficient information to code with codable children: Secondary | ICD-10-CM

## 2013-05-06 DIAGNOSIS — I10 Essential (primary) hypertension: Secondary | ICD-10-CM | POA: Diagnosis present

## 2013-05-06 DIAGNOSIS — Z96619 Presence of unspecified artificial shoulder joint: Secondary | ICD-10-CM

## 2013-05-06 DIAGNOSIS — J9601 Acute respiratory failure with hypoxia: Secondary | ICD-10-CM | POA: Diagnosis present

## 2013-05-06 DIAGNOSIS — F411 Generalized anxiety disorder: Secondary | ICD-10-CM | POA: Diagnosis present

## 2013-05-06 DIAGNOSIS — E785 Hyperlipidemia, unspecified: Secondary | ICD-10-CM | POA: Diagnosis present

## 2013-05-06 DIAGNOSIS — G9340 Encephalopathy, unspecified: Secondary | ICD-10-CM | POA: Diagnosis present

## 2013-05-06 DIAGNOSIS — M1990 Unspecified osteoarthritis, unspecified site: Secondary | ICD-10-CM | POA: Diagnosis present

## 2013-05-06 DIAGNOSIS — K219 Gastro-esophageal reflux disease without esophagitis: Secondary | ICD-10-CM | POA: Diagnosis present

## 2013-05-06 DIAGNOSIS — I959 Hypotension, unspecified: Secondary | ICD-10-CM | POA: Diagnosis present

## 2013-05-06 DIAGNOSIS — T402X1A Poisoning by other opioids, accidental (unintentional), initial encounter: Secondary | ICD-10-CM

## 2013-05-06 DIAGNOSIS — Z981 Arthrodesis status: Secondary | ICD-10-CM

## 2013-05-06 DIAGNOSIS — Z7982 Long term (current) use of aspirin: Secondary | ICD-10-CM

## 2013-05-06 DIAGNOSIS — Z79899 Other long term (current) drug therapy: Secondary | ICD-10-CM

## 2013-05-06 DIAGNOSIS — F329 Major depressive disorder, single episode, unspecified: Secondary | ICD-10-CM | POA: Diagnosis present

## 2013-05-06 DIAGNOSIS — N179 Acute kidney failure, unspecified: Secondary | ICD-10-CM | POA: Diagnosis present

## 2013-05-06 DIAGNOSIS — I4891 Unspecified atrial fibrillation: Secondary | ICD-10-CM

## 2013-05-06 DIAGNOSIS — R5381 Other malaise: Secondary | ICD-10-CM | POA: Diagnosis present

## 2013-05-06 LAB — PRO B NATRIURETIC PEPTIDE: Pro B Natriuretic peptide (BNP): 306.1 pg/mL — ABNORMAL HIGH (ref 0–125)

## 2013-05-06 LAB — CBC WITH DIFFERENTIAL/PLATELET
Basophils Absolute: 0 10*3/uL (ref 0.0–0.1)
Basophils Relative: 0 % (ref 0–1)
Eosinophils Absolute: 0.3 10*3/uL (ref 0.0–0.7)
Eosinophils Relative: 5 % (ref 0–5)
HCT: 33.6 % — ABNORMAL LOW (ref 39.0–52.0)
Hemoglobin: 10.9 g/dL — ABNORMAL LOW (ref 13.0–17.0)
Lymphocytes Relative: 25 % (ref 12–46)
Lymphs Abs: 1.8 10*3/uL (ref 0.7–4.0)
MCH: 28.8 pg (ref 26.0–34.0)
MCHC: 32.4 g/dL (ref 30.0–36.0)
MCV: 88.7 fL (ref 78.0–100.0)
Monocytes Absolute: 1.5 10*3/uL — ABNORMAL HIGH (ref 0.1–1.0)
Monocytes Relative: 21 % — ABNORMAL HIGH (ref 3–12)
Neutro Abs: 3.6 10*3/uL (ref 1.7–7.7)
Neutrophils Relative %: 50 % (ref 43–77)
Platelets: 198 10*3/uL (ref 150–400)
RBC: 3.79 MIL/uL — ABNORMAL LOW (ref 4.22–5.81)
RDW: 15.4 % (ref 11.5–15.5)
WBC: 7.2 10*3/uL (ref 4.0–10.5)

## 2013-05-06 LAB — COMPREHENSIVE METABOLIC PANEL
ALT: 9 U/L (ref 0–53)
AST: 18 U/L (ref 0–37)
Albumin: 3.1 g/dL — ABNORMAL LOW (ref 3.5–5.2)
Alkaline Phosphatase: 99 U/L (ref 39–117)
BUN: 14 mg/dL (ref 6–23)
CO2: 26 mEq/L (ref 19–32)
Calcium: 8.9 mg/dL (ref 8.4–10.5)
Chloride: 97 mEq/L (ref 96–112)
Creatinine, Ser: 1.47 mg/dL — ABNORMAL HIGH (ref 0.50–1.35)
GFR calc Af Amer: 56 mL/min — ABNORMAL LOW (ref >90)
GFR calc non Af Amer: 48 mL/min — ABNORMAL LOW (ref >90)
Glucose, Bld: 97 mg/dL (ref 70–99)
Potassium: 3.8 mEq/L (ref 3.7–5.3)
Sodium: 137 mEq/L (ref 137–147)
Total Bilirubin: 0.6 mg/dL (ref 0.3–1.2)
Total Protein: 6.4 g/dL (ref 6.0–8.3)

## 2013-05-06 LAB — POCT I-STAT 3, ART BLOOD GAS (G3+)
Bicarbonate: 27 mEq/L — ABNORMAL HIGH (ref 20.0–24.0)
O2 Saturation: 92 %
Patient temperature: 98.6
TCO2: 29 mmol/L (ref 0–100)
pCO2 arterial: 53.4 mmHg — ABNORMAL HIGH (ref 35.0–45.0)
pH, Arterial: 7.312 — ABNORMAL LOW (ref 7.350–7.450)
pO2, Arterial: 71 mmHg — ABNORMAL LOW (ref 80.0–100.0)

## 2013-05-06 LAB — ACETAMINOPHEN LEVEL: Acetaminophen (Tylenol), Serum: <15 ug/mL (ref 10–30)

## 2013-05-06 LAB — TROPONIN I: Troponin I: <0.3 ng/mL (ref <0.30)

## 2013-05-06 MED ORDER — FUROSEMIDE 20 MG PO TABS: 20.0000 mg | ORAL_TABLET | Freq: Every day | ORAL | Status: DC

## 2013-05-06 MED ORDER — TIOTROPIUM BROMIDE MONOHYDRATE 18 MCG IN CAPS
18.0000 ug | ORAL_CAPSULE | Freq: Every day | RESPIRATORY_TRACT | Status: DC
Start: 2013-05-06 — End: 2013-05-09
  Administered 2013-05-07 – 2013-05-09 (×3): 18 ug via RESPIRATORY_TRACT
  Filled 2013-05-06: qty 5

## 2013-05-06 MED ORDER — ALBUTEROL SULFATE (2.5 MG/3ML) 0.083% IN NEBU
5.0000 mg | INHALATION_SOLUTION | Freq: Once | RESPIRATORY_TRACT | Status: AC
  Administered 2013-05-06: 5 mg via RESPIRATORY_TRACT
  Filled 2013-05-06: qty 6

## 2013-05-06 MED ORDER — HYDROMORPHONE HCL PF 1 MG/ML IJ SOLN: 1.0000 mg | INTRAMUSCULAR | Status: DC | PRN

## 2013-05-06 MED ORDER — PANTOPRAZOLE SODIUM 40 MG PO TBEC
40.0000 mg | DELAYED_RELEASE_TABLET | Freq: Two times a day (BID) | ORAL | Status: DC
  Administered 2013-05-06 – 2013-05-09 (×6): 40 mg via ORAL
  Filled 2013-05-06 (×6): qty 1

## 2013-05-06 MED ORDER — COLCHICINE 0.6 MG PO TABS
0.6000 mg | ORAL_TABLET | Freq: Two times a day (BID) | ORAL | Status: DC
  Administered 2013-05-07 – 2013-05-09 (×5): 0.6 mg via ORAL
  Filled 2013-05-06 (×7): qty 1

## 2013-05-06 MED ORDER — LEVOFLOXACIN IN D5W 750 MG/150ML IV SOLN: 750.0000 mg | INTRAVENOUS | Status: DC

## 2013-05-06 MED ORDER — TRAZODONE HCL 100 MG PO TABS
100.0000 mg | ORAL_TABLET | Freq: Every day | ORAL | Status: DC
  Administered 2013-05-06: 100 mg via ORAL
  Filled 2013-05-06 (×2): qty 1

## 2013-05-06 MED ORDER — HYDROXYZINE HCL 25 MG PO TABS
25.0000 mg | ORAL_TABLET | Freq: Three times a day (TID) | ORAL | Status: DC | PRN
  Filled 2013-05-06: qty 1

## 2013-05-06 MED ORDER — PREDNISONE 5 MG PO TABS
5.0000 mg | ORAL_TABLET | Freq: Every evening | ORAL | Status: DC
  Administered 2013-05-06 – 2013-05-08 (×3): 5 mg via ORAL
  Filled 2013-05-06 (×4): qty 1

## 2013-05-06 MED ORDER — HYDRALAZINE HCL 10 MG PO TABS
10.0000 mg | ORAL_TABLET | Freq: Three times a day (TID) | ORAL | Status: DC
  Filled 2013-05-06 (×5): qty 1

## 2013-05-06 MED ORDER — POLYETHYLENE GLYCOL 3350 17 G PO PACK
17.0000 g | PACK | Freq: Every day | ORAL | Status: DC | PRN
  Administered 2013-05-07: 17 g via ORAL
  Filled 2013-05-06 (×2): qty 1

## 2013-05-06 MED ORDER — POTASSIUM CHLORIDE CRYS ER 20 MEQ PO TBCR
20.0000 meq | EXTENDED_RELEASE_TABLET | Freq: Every day | ORAL | Status: DC
  Administered 2013-05-07 – 2013-05-09 (×3): 20 meq via ORAL
  Filled 2013-05-06 (×3): qty 1

## 2013-05-06 MED ORDER — ALBUTEROL SULFATE (2.5 MG/3ML) 0.083% IN NEBU: 2.5000 mg | INHALATION_SOLUTION | RESPIRATORY_TRACT | Status: DC | PRN

## 2013-05-06 MED ORDER — SODIUM CHLORIDE 0.9 % IV BOLUS (SEPSIS)
1000.0000 mL | Freq: Once | INTRAVENOUS | Status: AC
  Administered 2013-05-06: 1000 mL via INTRAVENOUS

## 2013-05-06 MED ORDER — HYDROMORPHONE HCL 2 MG PO TABS: 4.0000 mg | ORAL_TABLET | Freq: Two times a day (BID) | ORAL | Status: DC | PRN

## 2013-05-06 MED ORDER — OXYCODONE HCL ER 20 MG PO T12A
40.0000 mg | EXTENDED_RELEASE_TABLET | Freq: Two times a day (BID) | ORAL | Status: DC | PRN
  Administered 2013-05-06: 40 mg via ORAL
  Filled 2013-05-06: qty 4

## 2013-05-06 MED ORDER — FLUOXETINE HCL 20 MG PO CAPS
40.0000 mg | ORAL_CAPSULE | Freq: Two times a day (BID) | ORAL | Status: DC
  Administered 2013-05-06 – 2013-05-09 (×6): 40 mg via ORAL
  Filled 2013-05-06 (×7): qty 2

## 2013-05-06 MED ORDER — IPRATROPIUM BROMIDE 0.02 % IN SOLN
0.5000 mg | Freq: Once | RESPIRATORY_TRACT | Status: AC
  Administered 2013-05-06: 0.5 mg via RESPIRATORY_TRACT
  Filled 2013-05-06: qty 2.5

## 2013-05-06 MED ORDER — SIMVASTATIN 40 MG PO TABS
40.0000 mg | ORAL_TABLET | Freq: Every day | ORAL | Status: DC
  Administered 2013-05-07 – 2013-05-09 (×3): 40 mg via ORAL
  Filled 2013-05-06 (×3): qty 1

## 2013-05-06 MED ORDER — DOCUSATE SODIUM 100 MG PO CAPS
100.0000 mg | ORAL_CAPSULE | Freq: Two times a day (BID) | ORAL | Status: DC
  Administered 2013-05-07 – 2013-05-09 (×4): 100 mg via ORAL
  Filled 2013-05-06 (×9): qty 1

## 2013-05-06 MED ORDER — FLUTICASONE PROPIONATE 50 MCG/ACT NA SUSP
2.0000 | Freq: Every day | NASAL | Status: DC
  Administered 2013-05-06 – 2013-05-08 (×3): 2 via NASAL
  Filled 2013-05-06: qty 16

## 2013-05-06 MED ORDER — TRAZODONE HCL 50 MG PO TABS
50.0000 mg | ORAL_TABLET | Freq: Every evening | ORAL | Status: DC | PRN
  Filled 2013-05-06: qty 1

## 2013-05-06 MED ORDER — HYDROMORPHONE HCL 2 MG PO TABS: 2.0000 mg | ORAL_TABLET | Freq: Two times a day (BID) | ORAL | Status: DC | PRN

## 2013-05-06 MED ORDER — METHYLPREDNISOLONE SODIUM SUCC 125 MG IJ SOLR: 60.0000 mg | Freq: Four times a day (QID) | INTRAMUSCULAR | Status: DC

## 2013-05-06 MED ORDER — ENOXAPARIN SODIUM 40 MG/0.4ML ~~LOC~~ SOLN
40.0000 mg | SUBCUTANEOUS | Status: DC
  Administered 2013-05-06 – 2013-05-08 (×3): 40 mg via SUBCUTANEOUS
  Filled 2013-05-06 (×4): qty 0.4

## 2013-05-06 MED ORDER — CELECOXIB 200 MG PO CAPS
200.0000 mg | ORAL_CAPSULE | Freq: Two times a day (BID) | ORAL | Status: DC
  Administered 2013-05-06 – 2013-05-07 (×2): 200 mg via ORAL
  Filled 2013-05-06 (×3): qty 1

## 2013-05-06 MED ORDER — ASPIRIN EC 81 MG PO TBEC
81.0000 mg | DELAYED_RELEASE_TABLET | Freq: Every morning | ORAL | Status: DC
  Administered 2013-05-07 – 2013-05-09 (×3): 81 mg via ORAL
  Filled 2013-05-06 (×3): qty 1

## 2013-05-06 MED ORDER — ALBUTEROL SULFATE (2.5 MG/3ML) 0.083% IN NEBU: 2.5000 mg | INHALATION_SOLUTION | Freq: Four times a day (QID) | RESPIRATORY_TRACT | Status: DC

## 2013-05-06 MED ORDER — METHYLPREDNISOLONE SODIUM SUCC 125 MG IJ SOLR
125.0000 mg | Freq: Once | INTRAMUSCULAR | Status: AC
  Administered 2013-05-06: 125 mg via INTRAVENOUS
  Filled 2013-05-06: qty 2

## 2013-05-06 NOTE — Assessment & Plan Note (Signed)
Wound remains open but has a good granulated appearance. HH wound care in place.  Plan Consultation at Wound center to augment home health care.

## 2013-05-06 NOTE — ED Provider Notes (Signed)
CSN: [id number]     Arrival date & time 05/06/13  1305 History   First MD Initiated Contact with Patient 05/06/13 1309     Chief Complaint  Patient presents with  . Altered Mental Status  . Respiratory Distress   (Consider location/radiation/quality/duration/timing/severity/associated sxs/prior Treatment) HPI Comments: Patient is a 68 year old male with history of COPD. He is brought for evaluation of difficulty breathing. He was less responsive this morning and the patient was transported here for evaluation. EMS applied bipap to 2 low oxygen saturations. He has little to the history secondary to medical condition.  Patient is a 68 y.o. male presenting with altered mental status. The history is provided by the patient and the EMS personnel.  Altered Mental Status Presenting symptoms: confusion and lethargy   Severity:  Severe Most recent episode:  Today Episode history:  Single Timing:  Constant Progression:  Partially resolved Chronicity:  New   Past Medical History  Diagnosis Date  . Hx of colonic polyps   . Diverticulosis   . Hypertension   . Osteoarthritis   . 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 CAD (coronary artery disease)     minimal coronary plaque in the LAD and right coronary system. 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 7-06. Last nuclear stress 11-17-06 fixed anterior/ inferior defect, no inducible ischemia, EF 81%  . Allergic rhinitis   . Hx of colonoscopy   . RA (rheumatoid arthritis)   . COPD (chronic obstructive pulmonary disease)   . PONV (postoperative nausea and vomiting)   . Dysrhythmia 01-24-12    past hx. A.Fib x1 episode-responded to med.  . Depression   . Anxiety     pt. feels its related to the medicine that he takes   . GERD (gastroesophageal reflux disease)    Past Surgical History  Procedure Laterality Date  . Appendectomy    .  Inguinal hernia repair  2008    Right, left remotely  . Total knee arthroplasty      right knee partial 2002  . Tonsillectomy    . Shoulder preplacement  2008    left, partial  . Ptca w/ coated stent lad      Cx secondary to tear  . Foot surgery  11-08    for removal of bone spurs- right foot  . Reconstruction of ankle  6-09    Right- [facility]  . L4-5 diskectomy w/ fusion, cage placement and rods  12-10    [place]  . Partial knee arthroplasty      left  . Total shoulder replacement      right  . Joint replacement    . Cataract extraction, bilateral  01-24-12    Bilateral  . Orif shoulder fracture  02/06/2012    Procedure: OPEN REDUCTION INTERNAL FIXATION (ORIF) SHOULDER FRACTURE;  Surgeon: Nita Sells, MD;  Location: [facility];  Service: Orthopedics;  Laterality: Left;  ORIF of a Left Shoulder Fracture with  Iliac Crest Bone Graft aspiration   . Harvest bone graft  02/06/2012    Procedure: HARVEST ILIAC BONE GRAFT;  Surgeon: Nita Sells, MD;  Location: [facility];  Service: Orthopedics;;  bone marrow aspirqation   . Hardware removal  03/09/2012    Procedure: HARDWARE REMOVAL;  Surgeon: Nita Sells, MD;  Location: [facility];  Service: Orthopedics;  Laterality: Left;  Hardware Removal  from Left Shoulder  . Cardiac catheterization  2006    coronary stents- no problems  . Shoulder arthroscopy  2013  . Carpal tunnel release      R hand  . Ankle surgery Right 2008    at [facility], replacement   . Dg toes*r*      toe surgery- 07/2012  . Posterior lumbar fusion 4 level N/A 03/02/2013    Procedure: Lumbar One to Sacral One Posterior lumbar interbody fusion;  Surgeon: Floyce Stakes, MD;  Location: [place] [facility];  Service: Neurosurgery;  Laterality: N/A;  L1 to S1 Posterior lumbar interbody fusion  . Lumbar wound debridement N/A 03/17/2013    Procedure: Incision and drainage of superficial lumbar wound;  Surgeon: Floyce Stakes, MD;  Location: [place]  [facility];  Service: Neurosurgery;  Laterality: N/A;  Incision and drainage of superficial lumbar wound   Family History  Problem Relation Age of Onset  . Uterine cancer Mother     survivor  . Macular degeneration Mother   . Other Mother     ankle edema  . Lung cancer Mother   . Cancer Mother     ovarian, lung  . Coronary artery disease Father   . Hypertension Father   . Prostate cancer Father   . Colon polyps Father   . Cancer Father     prostate  . Thyroid disease Sister   . Heart attack Brother   . Hyperlipidemia Brother   . Other Brother     Schizophrenic  . Diabetes Neg Hx   . Colon cancer Neg Hx   . Esophageal cancer Neg Hx   . Rectal cancer Neg Hx   . Stomach cancer Neg Hx   . Coronary artery disease Maternal Aunt   . Heart attack Maternal Aunt   . Rheum arthritis Sister   . Hemochromatosis Sister    History  Substance Use Topics  . Smoking status: Never Smoker   . Smokeless tobacco: Never Used  . Alcohol Use: No    Review of Systems  Unable to perform ROS Psychiatric/Behavioral: Positive for confusion.    Allergies  Morphine and related; Penicillins; Cefixime; and Percocet  Home Medications   Current Outpatient Rx  Name  Route  Sig  Dispense  Refill  . Aclidinium Bromide (TUDORZA PRESSAIR) 400 MCG/ACT AEPB   Inhalation   Inhale 1 puff into the lungs 2 (two) times daily.   1 each   11   . adalimumab (HUMIRA) 40 MG/0.8ML injection   Subcutaneous   Inject 40 mg into the skin every 14 (fourteen) days.         . ALPRAZolam (XANAX) 0.5 MG tablet   Oral   Take 1 tablet (0.5 mg total) by mouth 3 (three) times daily as needed for anxiety or sleep.   90 tablet   5   . aspirin (ASPIRIN EC) 81 MG EC tablet   Oral   Take 81 mg by mouth every morning.          . celecoxib (CELEBREX) 200 MG capsule   Oral   Take 200 mg by mouth 2 (two) times daily.         . cholecalciferol (VITAMIN D) 1000 UNITS tablet   Oral   Take 1 tablet (1,000 Units  total) by mouth daily.   100 tablet   3   . ciprofloxacin (CIPRO) 500 MG tablet   Oral   Take 1 tablet (500 mg total) by mouth 2 (two)  times daily.   60 tablet   0   . colchicine 0.6 MG tablet   Oral   Take 0.6 mg by mouth 2 (two) times daily.         [name] dextrose 5 % SOLN 50 mL with ceFEPIme 2 G SOLR 2 g   Intravenous   Inject 2 g into the vein every 8 (eight) hours. Till January 24th 2015.   24 g   0   . diazepam (VALIUM) 5 MG tablet   Oral   Take 1 tablet (5 mg total) by mouth every 6 (six) hours as needed for muscle spasms.   30 tablet   0   . docusate sodium (COLACE) 100 MG capsule   Oral   Take 1 capsule (100 mg total) by mouth 2 (two) times daily.   60 capsule   0   . FLUoxetine (PROZAC) 40 MG capsule   Oral   Take 40 mg by mouth 2 (two) times daily.         . fluticasone (FLONASE) 50 MCG/ACT nasal spray   Each Nare   Place 2 sprays into both nostrils daily.         . furosemide (LASIX) 20 MG tablet   Oral   Take 20 mg by mouth daily.         [name] gabapentin (NEURONTIN) 300 MG capsule   Oral   Take 300-600 mg by mouth 4 (four) times daily. 600 mg every morning, 300 mg at noon, 600 mg at bedtime, and 300 mg at 1 AM         . hydrochlorothiazide (HYDRODIURIL) 25 MG tablet   Oral   Take 25 mg by mouth daily before breakfast.          . HYDROmorphone (DILAUDID) 4 MG tablet   Oral   Take 1 tablet (4 mg total) by mouth 2 (two) times daily as needed for moderate pain or severe pain.   15 tablet   0   . hydrOXYzine (ATARAX/VISTARIL) 25 MG tablet   Oral   Take 1 tablet (25 mg total) by mouth 3 (three) times daily as needed.   50 tablet   0   . losartan (COZAAR) 50 MG tablet   Oral   Take 50 mg by mouth daily.         [name] losartan (COZAAR) 50 MG tablet      TAKE 1 TABLET BY MOUTH ONCE A DAY   90 tablet   3   . Melatonin ([employer] MELATONIN) 5 MG TABS   Oral   Take 5 mg by mouth at bedtime.          . Nutritional Supplements (ENSURE  COMPLETE PO)   Oral   Take 0.5 Cans by mouth 2 (two) times daily.         . OxyCODONE (OXYCONTIN) 40 mg T12A 12 hr tablet   Oral   Take 40 mg by mouth every 8 (eight) hours as needed (pain).         . pantoprazole (PROTONIX) 40 MG tablet   Oral   Take 40 mg by mouth 2 (two) times daily.          Vladimir Faster Glycol-Propyl Glycol (SYSTANE ULTRA) 0.4-0.3 % SOLN   Both Eyes   Place 2 drops into both eyes 2 (two) times daily as needed (dry eyes).          . polyethylene glycol (MIRALAX / GLYCOLAX) packet   Oral  Take 17 g by mouth daily as needed for moderate constipation.   14 each   1   . potassium chloride SA (K-DUR,KLOR-CON) 20 MEQ tablet   Oral   Take 20 mEq by mouth daily.         . predniSONE (DELTASONE) 5 MG tablet   Oral   Take 5 mg by mouth every evening.          . simvastatin (ZOCOR) 40 MG tablet   Oral   Take 40 mg by mouth daily. At lunch         . traZODone (DESYREL) 100 MG tablet      TAKE 1 TABLET (100 MG TOTAL) BY MOUTH AT BEDTIME.   30 tablet   8   . traZODone (DESYREL) 50 MG tablet      TAKE 1 TABLET (50 MG TOTAL) BY MOUTH AT BEDTIME.   60 tablet   3   . zolpidem (AMBIEN) 5 MG tablet   Oral   Take 1 tablet (5 mg total) by mouth at bedtime as needed for sleep.   30 tablet   0    Pulse 108  SpO2 97% Physical Exam  Nursing note and vitals reviewed. Constitutional: He is oriented to person, place, and time.  Patient is a 68 year old male who appears thin and somewhat cachectic. He is somnolent but arousable.  HENT:  Head: Normocephalic and atraumatic.  Mouth/Throat: Oropharynx is clear and moist.  Eyes: Pupils are equal, round, and reactive to light.  Neck: Normal range of motion. Neck supple.  Cardiovascular: Normal rate, regular rhythm and normal heart sounds.   No murmur heard. Pulmonary/Chest: Effort normal. No respiratory distress.  Abdominal: Soft. Bowel sounds are normal.  Musculoskeletal: Normal range of motion. He  exhibits no edema.  Neurological: He is alert and oriented to person, place, and time. No cranial nerve deficit. He exhibits normal muscle tone. Coordination normal.  Skin: Skin is warm and dry.    ED Course  Procedures (including critical care time) Labs Review Labs Reviewed  CBC WITH DIFFERENTIAL - Abnormal; Notable for the following:    RBC 3.79 (*)    Hemoglobin 10.9 (*)    HCT 33.6 (*)    Monocytes Relative 21 (*)    Monocytes Absolute 1.5 (*)    All other components within normal limits  COMPREHENSIVE METABOLIC PANEL - Abnormal; Notable for the following:    Creatinine, Ser 1.47 (*)    Albumin 3.1 (*)    GFR calc non Af Amer 48 (*)    GFR calc Af Amer 56 (*)    All other components within normal limits  PRO B NATRIURETIC PEPTIDE - Abnormal; Notable for the following:    Pro B Natriuretic peptide (BNP) 306.1 (*)    All other components within normal limits  TROPONIN I   Imaging Review Dg Chest Port 1 View  05/06/2013   CLINICAL DATA:  Shortness of breath, week  EXAM: PORTABLE CHEST - 1 VIEW  COMPARISON:  04/12/2013  FINDINGS: Very limited inspiratory effect. Cannot evaluate heart size as a result. The vascular pattern appears normal. Right lung is clear. No abnormal opacities on the left. Stable scoliotic curvature thoracolumbar spine.  IMPRESSION: Study limited by very limited inspiratory effect. Grossly negative for acute abnormalities.   Electronically Signed   By: Skipper Cliche M.D.   On: 05/06/2013 13:43    EKG Interpretation    Date/Time:  Thursday May 06 2013 13:15:18 EST Ventricular Rate:  107  PR Interval:  173 QRS Duration: 98 QT Interval:  369 QTC Calculation: 492 R Axis:   137 Text Interpretation:  Sinus tachycardia Consider right atrial enlargement Anteroseptal infarct, age indeterminate Baseline wander in lead(s) V3 Confirmed by DELOS  MD, Coraleigh Sheeran ([id number]) on 05/06/2013 3:53:27 PM            MDM  No diagnosis found. Patient is a 68 year old  male brought by EMS for evaluation of difficulty breathing and altered mental status. He apparently fell last night at home but denies having struck his head. He is recovering from back surgery with an infected incision. His wife administered a 20 mg OxyContin tablet this morning and checked on him later. He was difficult to arouse and appeared very sleepy. He was brought here on CPAP.  Shortly after arriving to the ER as CPAP was removed and he appeared to be maintaining his airway and saturations adequately. Workup was initiated, however no significant abnormalities were found. His blood counts were unremarkable, electrolytes are unchanged, and nothing in his CT scan of the head or cervical spine were abnormal.  I have not found a definite reason for his mental status change, however I suspect this may well be related to his pain medication. At this point I feel as though admission and observation is appropriate. Dr. Charlies Silvers agrees to admit.    Veryl Speak, MD 05/06/13 [phone]

## 2013-05-06 NOTE — Progress Notes (Signed)
Pt placed on Bipap 10/5 30%. Tolerating well, taken off by RT and placed on 2L [place] per MD verbal order. RT will continue to follow.

## 2013-05-06 NOTE — H&P (Signed)
[facility] History and Physical  [name] ZOX:[id number] DOB: 1945-07-01 DOA: 05/06/2013  Referring physician: ER physician PCP: Adella Hare, MD   Chief Complaint: altered mental status  HPI:  68 year old male with past medical history of COPD, hypertension, dyslipidemia who presented to [facility] ED 05/06/2013 with confusion. He is not a good historian and he initially did not have family member at the bedside to provide the details of his HPI. He had low oxygen saturation on arrival. Later, his wife reported she found him unresponsive or less responsive and she thought he had taken way too many pain meds and may have overdosed on narcotics. Additionally, per patient he fell at home yesterday. He was more alert in ED but still not providing adequate history. No reports of chest pain, palpitations, shortness of breath. No abdominal pain, nausea or vomiting. No reports of blood in stool or urine. No fever or chills. In ED, his oxygen saturation was improved, 94% with nasal canula oxygen support. BP was 120/90 and then 90/57, HR 114 and Tmax 98.7 F. CBC revealed Hgb of 10.9 adn BMP revealed creatinine of 1.47. CT head and cervical spine did not reveal acute intracranial findings.  No acute findings on CXR.  Assessment and Plan:  Principal Problem:  Acute respiratory failure with hypoxia  - possibly due to narcotic overdose  - check UDS, alcohol level, tylenol level  - admission to stepdown  - more alert now but it is reasonable to monitor in SDU for next 24 hours  - may resume home long acting medication OxyContin since he is the hospital and we can watch for overdose. For better pain control may use PO PRN dilaudid. Try to avoid IV narcotics  - respiratory status stable at this time and saturating above 90% with [place] oxygen support  Active Problems:  Opioid overdose  - management as above in SDU for next 24 hours  - 12 lead EKG showed sinus tachycardia  Acute encephalopathy  -  secondary to narcotic overdose  - mental status improving  DYSLIPIDEMIA  - continue simvastatin 40 mg daily  PSEUDOGOUT  - on colchicine  HYPERTENSION  - continue lasix  COPD (chronic obstructive pulmonary disease)  - stable; hypoxia likely due to opioid overdose - no fevers or elevated WBC count - continue spiriva, on daily low dose prednisone Acute renal failure - likely due to lasix or dehydration - hold lasix  Radiological Exams on Admission:  Ct Head Wo Contrast  05/06/2013 IMPRESSION: No acute abnormality. No change since the prior exam.   Ct Cervical Spine Wo Contrast  05/06/2013 IMPRESSION: No acute abnormality of the cervical spine. Multilevel degenerative disc and joint disease. Identified subluxations are felt to be chronic.   Dg Chest Port 1 View  05/06/2013 IMPRESSION: Study limited by very limited inspiratory effect. Grossly negative for acute abnormalities.    Code Status: Full Family Communication: Pt at bedside Disposition Plan: Admit for further evaluation  Leisa Lenz, MD  [facility] Pager [phone]  Review of Systems:  Constitutional: Negative for fever, chills and malaise/fatigue. Negative for diaphoresis.  HENT: Negative for hearing loss, ear pain, nosebleeds, congestion, sore throat, neck pain, tinnitus and ear discharge.   Eyes: Negative for blurred vision, double vision, photophobia, pain, discharge and redness.  Respiratory: Negative for cough, hemoptysis, sputum production, shortness of breath, wheezing and stridor.   Cardiovascular: Negative for chest pain, palpitations, orthopnea, claudication and leg swelling.  Gastrointestinal: Negative for nausea, vomiting and abdominal pain. Negative  for heartburn, constipation, blood in stool and melena.  Genitourinary: Negative for dysuria, urgency, frequency, hematuria and flank pain.  Musculoskeletal: Negative for myalgias, back pain, joint pain and positive for falls.  Skin: Negative for itching  and rash.  Neurological: Negative for dizziness and weakness. Endo/Heme/Allergies: Negative for environmental allergies and polydipsia. Does not bruise/bleed easily.  Psychiatric/Behavioral: Negative for suicidal ideas. The patient is not nervous/anxious.      Past Medical History  Diagnosis Date  . Hx of colonic polyps   . Diverticulosis   . Hypertension   . Osteoarthritis   . 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 CAD (coronary artery disease)     minimal coronary plaque in the LAD and right coronary system. 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 7-06. Last nuclear stress 11-17-06 fixed anterior/ inferior defect, no inducible ischemia, EF 81%  . Allergic rhinitis   . Hx of colonoscopy   . RA (rheumatoid arthritis)   . COPD (chronic obstructive pulmonary disease)   . PONV (postoperative nausea and vomiting)   . Dysrhythmia 01-24-12    past hx. A.Fib x1 episode-responded to med.  . Depression   . Anxiety     pt. feels its related to the medicine that he takes   . GERD (gastroesophageal reflux disease)    Past Surgical History  Procedure Laterality Date  . Appendectomy    . Inguinal hernia repair  2008    Right, left remotely  . Total knee arthroplasty      right knee partial 2002  . Tonsillectomy    . Shoulder preplacement  2008    left, partial  . Ptca w/ coated stent lad      Cx secondary to tear  . Foot surgery  11-08    for removal of bone spurs- right foot  . Reconstruction of ankle  6-09    Right- [facility]  . L4-5 diskectomy w/ fusion, cage placement and rods  12-10    [place]  . Partial knee arthroplasty      left  . Total shoulder replacement      right  . Joint replacement    . Cataract extraction, bilateral  01-24-12    Bilateral  . Orif shoulder fracture  02/06/2012    Procedure: OPEN REDUCTION INTERNAL FIXATION (ORIF) SHOULDER FRACTURE;  Surgeon:  Nita Sells, MD;  Location: [facility];  Service: Orthopedics;  Laterality: Left;  ORIF of a Left Shoulder Fracture with  Iliac Crest Bone Graft aspiration   . Harvest bone graft  02/06/2012    Procedure: HARVEST ILIAC BONE GRAFT;  Surgeon: Nita Sells, MD;  Location: [facility];  Service: Orthopedics;;  bone marrow aspirqation   . Hardware removal  03/09/2012    Procedure: HARDWARE REMOVAL;  Surgeon: Nita Sells, MD;  Location: [place];  Service: Orthopedics;  Laterality: Left;  Hardware Removal from Left Shoulder  . Cardiac catheterization  2006    coronary stents- no problems  . Shoulder arthroscopy  2013  . Carpal tunnel release      R hand  . Ankle surgery Right 2008    at [facility], replacement   . Dg toes*r*      toe surgery- 07/2012  . Posterior lumbar fusion 4 level N/A 03/02/2013    Procedure: Lumbar One to Sacral One Posterior lumbar interbody fusion;  Surgeon: Stann Mainland  Andres Shad, MD;  Location: [place] [facility];  Service: Neurosurgery;  Laterality: N/A;  L1 to S1 Posterior lumbar interbody fusion  . Lumbar wound debridement N/A 03/17/2013    Procedure: Incision and drainage of superficial lumbar wound;  Surgeon: Floyce Stakes, MD;  Location: [place] [facility];  Service: Neurosurgery;  Laterality: N/A;  Incision and drainage of superficial lumbar wound   Social History:  reports that he has never smoked. He has never used smokeless tobacco. He reports that he does not drink alcohol or use illicit drugs.  Allergies  Allergen Reactions  . Morphine And Related Shortness Of Breath, Nausea And Vomiting, Swelling and Other (See Comments)    Agitation   . Penicillins Hives, Shortness Of Breath and Rash    REACTION: hives, breathing problems  . Cefixime     rash  . Percocet [Oxycodone-Acetaminophen] Anxiety    Family History:  Family History  Problem Relation Age of Onset  . Uterine cancer Mother     survivor  . Macular degeneration Mother   .  Other Mother     ankle edema  . Lung cancer Mother   . Cancer Mother     ovarian, lung  . Coronary artery disease Father   . Hypertension Father   . Prostate cancer Father   . Colon polyps Father   . Cancer Father     prostate  . Thyroid disease Sister   . Heart attack Brother   . Hyperlipidemia Brother   . Other Brother     Schizophrenic  . Diabetes Neg Hx   . Colon cancer Neg Hx   . Esophageal cancer Neg Hx   . Rectal cancer Neg Hx   . Stomach cancer Neg Hx   . Coronary artery disease Maternal Aunt   . Heart attack Maternal Aunt   . Rheum arthritis Sister   . Hemochromatosis Sister      Prior to Admission medications   Medication Sig Start Date End Date Taking? Authorizing Provider  Aclidinium Bromide (TUDORZA PRESSAIR) 400 MCG/ACT AEPB Inhale 1 puff into the lungs 2 (two) times daily. 01/23/12  Yes Neena Rhymes, MD  Aclidinium Bromide 400 MCG/ACT AEPB Inhale 2 puffs into the lungs at bedtime.   Yes Historical Provider, MD  ALPRAZolam Duanne Moron) 0.5 MG tablet Take 1 tablet (0.5 mg total) by mouth 3 (three) times daily as needed for anxiety or sleep. 03/11/13  Yes Tiffany L Reed, DO  aspirin (ASPIRIN EC) 81 MG EC tablet Take 81 mg by mouth every morning.    Yes Historical Provider, MD  celecoxib (CELEBREX) 200 MG capsule Take 200 mg by mouth 2 (two) times daily.   Yes Historical Provider, MD  cholecalciferol (VITAMIN D) 1000 UNITS tablet Take 1 tablet (1,000 Units total) by mouth daily. 06/24/12 06/24/13 Yes Evie Lacks Plotnikov, MD  colchicine 0.6 MG tablet Take 0.6 mg by mouth 2 (two) times daily.   Yes Historical Provider, MD  diazepam (VALIUM) 5 MG tablet Take 1 tablet (5 mg total) by mouth every 6 (six) hours as needed for muscle spasms. 03/26/13  Yes Neena Rhymes, MD  docusate sodium (COLACE) 100 MG capsule Take 1 capsule (100 mg total) by mouth 2 (two) times daily. 04/24/13  Yes Bonnielee Haff, MD  FLUoxetine (PROZAC) 40 MG capsule Take 40 mg by mouth 2 (two) times  daily.   Yes Historical Provider, MD  fluticasone (FLONASE) 50 MCG/ACT nasal spray Place 2 sprays into both nostrils at bedtime.  Yes Historical Provider, MD  furosemide (LASIX) 20 MG tablet Take 20 mg by mouth daily.   Yes Historical Provider, MD  gabapentin (NEURONTIN) 300 MG capsule Take 300-600 mg by mouth 4 (four) times daily. 600 mg every morning, 300 mg at noon, 600 mg at bedtime, and 300 mg at 1 AM   Yes Historical Provider, MD  hydrochlorothiazide (HYDRODIURIL) 25 MG tablet Take 25 mg by mouth daily before breakfast.    Yes Historical Provider, MD  HYDROmorphone (DILAUDID) 2 MG tablet Take 2 mg by mouth every 4 (four) hours as needed for severe pain.   Yes Historical Provider, MD  HYDROmorphone (DILAUDID) 4 MG tablet Take 1 tablet (4 mg total) by mouth 2 (two) times daily as needed for moderate pain or severe pain. 04/24/13  Yes Bonnielee Haff, MD  hydrOXYzine (ATARAX/VISTARIL) 25 MG tablet Take 25 mg by mouth 3 (three) times daily as needed for itching.   Yes Historical Provider, MD  losartan (COZAAR) 50 MG tablet Take 50 mg by mouth daily.   Yes Historical Provider, MD  Melatonin ([employer] MELATONIN) 5 MG TABS Take 5 mg by mouth at bedtime.    Yes Historical Provider, MD  Nutritional Supplements (ENSURE COMPLETE PO) Take 0.5 Cans by mouth 2 (two) times daily.   Yes Historical Provider, MD  OxyCODONE (OXYCONTIN) 40 mg T12A 12 hr tablet Take 40 mg by mouth 2 (two) times daily as needed (pain).    Yes Historical Provider, MD  pantoprazole (PROTONIX) 40 MG tablet Take 40 mg by mouth 2 (two) times daily.  07/29/12  Yes Neena Rhymes, MD  Polyethyl Glycol-Propyl Glycol (SYSTANE ULTRA) 0.4-0.3 % SOLN Place 2 drops into both eyes 2 (two) times daily as needed (dry eyes).    Yes Historical Provider, MD  polyethylene glycol (MIRALAX / GLYCOLAX) packet Take 17 g by mouth daily as needed for moderate constipation. 04/24/13  Yes Bonnielee Haff, MD  potassium chloride SA (K-DUR,KLOR-CON) 20 MEQ tablet Take  20 mEq by mouth daily.   Yes Historical Provider, MD  simvastatin (ZOCOR) 40 MG tablet Take 40 mg by mouth daily with lunch.    Yes Historical Provider, MD  traZODone (DESYREL) 100 MG tablet Take 100 mg by mouth at bedtime.   Yes Historical Provider, MD  traZODone (DESYREL) 50 MG tablet Take 50 mg by mouth at bedtime as needed for sleep (take if the 100 mg dose is not effective.).   Yes Historical Provider, MD  zolpidem (AMBIEN) 5 MG tablet Take 1 tablet (5 mg total) by mouth at bedtime as needed for sleep. 03/26/13  Yes Neena Rhymes, MD  ciprofloxacin (CIPRO) 500 MG tablet Take 1 tablet (500 mg total) by mouth 2 (two) times daily. 04/29/13   Truman Hayward, MD  dextrose 5 % SOLN 50 mL with ceFEPIme 2 G SOLR 2 g Inject 2 g into the vein every 8 (eight) hours. Till January 24th 2015. 04/23/13   Bonnielee Haff, MD  predniSONE (DELTASONE) 5 MG tablet Take 5 mg by mouth every evening.     Historical Provider, MD   Physical Exam: Filed Vitals:   05/06/13 1310 05/06/13 1502  Pulse: 108   SpO2: 97% 100%    Physical Exam  Constitutional: Appears ill, no acute distress HENT: Normocephalic. External right and left ear normal.  Eyes: Conjunctivae and EOM are normal. PERRLA, no scleral icterus.  Neck: Normal ROM. Neck supple. No JVD. No tracheal deviation. CVS: RRR, S1/S2 appreciated Pulmonary: diminished breath sounds  bilaterally  Abdominal: Soft. BS +,  no distension, tenderness, rebound or guarding.  Musculoskeletal: Normal range of motion. No edema and no tenderness.  Lymphadenopathy: No lymphadenopathy noted, cervical, inguinal. Neuro: Alert. No focal neurologic deficits  Skin: Skin is warm and dry. No rash noted.   Labs on Admission:  Basic Metabolic Panel:  Recent Labs Lab 05/06/13 1320  NA 137  K 3.8  CL 97  CO2 26  GLUCOSE 97  BUN 14  CREATININE 1.47*  CALCIUM 8.9   Liver Function Tests:  Recent Labs Lab 05/06/13 1320  AST 18  ALT 9  ALKPHOS 99  BILITOT 0.6   PROT 6.4  ALBUMIN 3.1*   CBC:  Recent Labs Lab 05/06/13 1320  WBC 7.2  NEUTROABS 3.6  HGB 10.9*  HCT 33.6*  MCV 88.7  PLT 198   Cardiac Enzymes:  Recent Labs Lab 05/06/13 1321  TROPONINI <0.30    If 7PM-7AM, please contact night-coverage [email or web address] Password [email or web address] 05/06/2013, 3:13 PM

## 2013-05-06 NOTE — Assessment & Plan Note (Signed)
Discussed making nutritional smoothies, non-dairy, with protein and vitamin supplement powder

## 2013-05-06 NOTE — Telephone Encounter (Signed)
Phone call from Vonna Drafts [phone] states she is home with the patient now. He had 2 falls last night. His blood pressure is currently 66/46 HR 110 and R 14. Patient is also confused.  Per Dr Oletha Blend patient needs to go to the ER by ambulance since BP is so low.  I called Juliann Pulse back and spoke to her directly and let her know this.

## 2013-05-06 NOTE — ED Notes (Signed)
Per EMS: Pt from home, wife reports fall last night, denies LOC. This AM, pt noted to be altered, oriented to self/situation. Lungs diminished in bases, fluid in upper lungs; pt placed on CPAP, O2 increased to 98%. BP 88/68, increased to 116/73 with trendelenburg.  Pt had recent lower back surgery. Hx of recent falls. 103 ST, CBG 125.

## 2013-05-07 DIAGNOSIS — Z9181 History of falling: Secondary | ICD-10-CM

## 2013-05-07 DIAGNOSIS — G9340 Encephalopathy, unspecified: Secondary | ICD-10-CM

## 2013-05-07 LAB — CBC
HCT: 30 % — ABNORMAL LOW (ref 39.0–52.0)
Hemoglobin: 9.8 g/dL — ABNORMAL LOW (ref 13.0–17.0)
MCH: 28.4 pg (ref 26.0–34.0)
MCHC: 32.7 g/dL (ref 30.0–36.0)
MCV: 87 fL (ref 78.0–100.0)
Platelets: 207 10*3/uL (ref 150–400)
RBC: 3.45 MIL/uL — ABNORMAL LOW (ref 4.22–5.81)
RDW: 14.9 % (ref 11.5–15.5)
WBC: 3.6 10*3/uL — ABNORMAL LOW (ref 4.0–10.5)

## 2013-05-07 LAB — RAPID URINE DRUG SCREEN, HOSP PERFORMED
Amphetamines: NOT DETECTED
Barbiturates: NOT DETECTED
Benzodiazepines: POSITIVE — AB
Cocaine: NOT DETECTED
Opiates: POSITIVE — AB
Tetrahydrocannabinol: NOT DETECTED

## 2013-05-07 LAB — BASIC METABOLIC PANEL
BUN: 18 mg/dL (ref 6–23)
CO2: 24 mEq/L (ref 19–32)
Calcium: 8.2 mg/dL — ABNORMAL LOW (ref 8.4–10.5)
Chloride: 98 mEq/L (ref 96–112)
Creatinine, Ser: 0.84 mg/dL (ref 0.50–1.35)
GFR calc Af Amer: >90 mL/min (ref >90)
GFR calc non Af Amer: 89 mL/min — ABNORMAL LOW (ref >90)
Glucose, Bld: 216 mg/dL — ABNORMAL HIGH (ref 70–99)
Potassium: 4.2 mEq/L (ref 3.7–5.3)
Sodium: 137 mEq/L (ref 137–147)

## 2013-05-07 MED ORDER — ONDANSETRON HCL 4 MG/2ML IJ SOLN
4.0000 mg | Freq: Four times a day (QID) | INTRAMUSCULAR | Status: DC | PRN
  Administered 2013-05-07: 4 mg via INTRAVENOUS
  Filled 2013-05-07: qty 2

## 2013-05-07 MED ORDER — TRAZODONE HCL 50 MG PO TABS
50.0000 mg | ORAL_TABLET | Freq: Every day | ORAL | Status: DC
  Administered 2013-05-07 – 2013-05-08 (×2): 50 mg via ORAL
  Filled 2013-05-07 (×3): qty 1

## 2013-05-07 MED ORDER — DIAZEPAM 5 MG PO TABS: 5.0000 mg | ORAL_TABLET | Freq: Three times a day (TID) | ORAL | Status: DC | PRN

## 2013-05-07 MED ORDER — HYDROMORPHONE HCL 2 MG PO TABS
1.0000 mg | ORAL_TABLET | Freq: Two times a day (BID) | ORAL | Status: DC | PRN
  Administered 2013-05-07 – 2013-05-08 (×2): 1 mg via ORAL
  Filled 2013-05-07 (×3): qty 1

## 2013-05-07 MED ORDER — OXYCODONE HCL 5 MG PO TABS: 5.0000 mg | ORAL_TABLET | ORAL | Status: DC | PRN

## 2013-05-07 MED ORDER — ENSURE COMPLETE PO LIQD
237.0000 mL | ORAL | Status: DC
  Administered 2013-05-07 – 2013-05-08 (×2): 237 mL via ORAL

## 2013-05-07 MED ORDER — PSYLLIUM 95 % PO PACK
1.0000 | PACK | Freq: Every day | ORAL | Status: DC
  Administered 2013-05-07 – 2013-05-09 (×3): 1 via ORAL
  Filled 2013-05-07 (×3): qty 1

## 2013-05-07 MED ORDER — HYDROMORPHONE HCL 2 MG PO TABS: 4.0000 mg | ORAL_TABLET | Freq: Two times a day (BID) | ORAL | Status: DC | PRN

## 2013-05-07 MED ORDER — GABAPENTIN 300 MG PO CAPS
300.0000 mg | ORAL_CAPSULE | Freq: Three times a day (TID) | ORAL | Status: DC
  Administered 2013-05-07 – 2013-05-09 (×6): 300 mg via ORAL
  Filled 2013-05-07 (×9): qty 1

## 2013-05-07 MED ORDER — DIAZEPAM 5 MG PO TABS: 5.0000 mg | ORAL_TABLET | Freq: Two times a day (BID) | ORAL | Status: DC

## 2013-05-07 MED ORDER — HYDROMORPHONE HCL 2 MG PO TABS: 1.0000 mg | ORAL_TABLET | Freq: Two times a day (BID) | ORAL | Status: DC | PRN

## 2013-05-07 MED ORDER — HYDROCODONE-ACETAMINOPHEN 5-325 MG PO TABS: 1.0000 | ORAL_TABLET | Freq: Four times a day (QID) | ORAL | Status: DC | PRN

## 2013-05-07 MED ORDER — HYDROMORPHONE HCL 2 MG PO TABS: 4.0000 mg | ORAL_TABLET | Freq: Two times a day (BID) | ORAL | Status: DC

## 2013-05-07 NOTE — Care Management Note (Signed)
    Page 1 of 1   05/07/2013     11:18:01 AM   CARE MANAGEMENT NOTE 05/07/2013  Patient:  [name], [name]   Account Number:  [id number]  Date Initiated:  05/07/2013  Documentation initiated by:  Marvetta Gibbons  Subjective/Objective Assessment:   Pt admitted with AMS- opioid overdose     Action/Plan:   PTA pt lived at home with SO- was active with [facility] for HH-RN/PT- will need resumption orders for discharge   Anticipated DC Date:  05/08/2013   Anticipated DC Plan:  Gas  CM consult      [facility] Choice  Resumption Of Svcs/PTA Provider   Choice offered to / List presented to:             [place].   Status of service:  In process, will continue to follow Medicare Important Message given?   (If response is "NO", the following Medicare IM given date fields will be blank) Date Medicare IM given:   Date Additional Medicare IM given:    Discharge Disposition:    Per UR Regulation:  Reviewed for med. necessity/level of care/duration of stay  If discussed at [place] of Stay Meetings, dates discussed:    Comments:

## 2013-05-07 NOTE — Progress Notes (Signed)
[facility]  Patient Status: Active (receiving services up to time of hospitalization)  [facility] is providing the following services: RN, PT and ST  If patient discharges after hours, please call [phone].   [name] 05/07/2013, 12:27 PM

## 2013-05-07 NOTE — Progress Notes (Signed)
[facility] Progress Note [place] TEAM 1 - Stepdown/ICU TEAM   [name] IWL:[id number] DOB: Feb 26, 1946 DOA: 05/06/2013 PCP: Adella Hare, MD  Brief narrative: 68 year old male with past medical history of RA arthritis on steroids (Humira d/c'd recently due to infection), posterior lumbar fusion (03/19/13) with post-op pseudomonas infection s/p debridement and treatment with 40 days of Cefepime ending Jan 23rd (developed hives towards the end off treatment but completed course). Also, has h/o COPD, hypertension, dyslipidemia who presented to [facility] ED 05/06/2013 with confusion. He was recently discharged on 1/17 after and admission for hypotension and SBO which was treated conservatively. Per the patient's wife, he has been falling lately - 8 falls in about 1 wk. She has been cutting back on his pain medications and Valium but he was noted to be having difficulty breathing, lethargic, confused and hypotensive with SBP < 100 was sent to the ER. He was placed on BiPAP by EMS which was removed by the ER.   I spoke with his wife extensively today. She also notes a weight loss of about 25 lbs since his surgery.   Subjective: Has no complaints. Cannot remember yesterday's events. Appears slightly confused- cannot recall his wife's phone number.   Assessment/Plan: Principal Problem:   Acute encephalopathy/ Hypotension/ acute respiratory failure - overall resolved except for some confusion - pt has been on chronic pain medications which were recently increased due to back surgery and subsequent infection - suspect related to multiple medications and poor renal clearance (ARF)-has been on Dilaudid, Oxycontin, Neurontin, Valium, Hydroxizine and BP meds - will cut back on doses and follow during the hospital stay to ensure he remains stable  Active Problems:  Hypotension - d/c HCTZ, Losartan and Lasix- due to weight loss he may now be normotensive- follow  ARF - acute and possible  due to hypotension- note GFR dropped from > 90 to high 40s - improved  Back pain - resume lower dose Neurontin, Dialudid for severe pain and Hydrocodone for moderate pain PRN  Depression - wife recently stopped Prozac- will resume as he will likely have withdrawal symptoms in a couple of weeks -can taper slowly as outpt  Weight loss - encourage wife to include higher amount of carbs in diet- has mostly been giving him protein    DYSLIPIDEMIA - cont simvastatin    PSEUDOGOUT - colchicine  RA - cont Prednisone - holding Humira  Falls - PT eval-   Lumbar fusion with infection - requesting WOC eval for persistent wound on back   Code Status: Full code  Family Communication: with wife Disposition Plan: transfer to med/surg- PT eval  Consultants: none  Procedures: none  Antibiotics: none  DVT prophylaxis: Lovenox  Objective: Blood pressure 134/84, pulse 81, temperature 97.5 F (36.4 C), temperature source Oral, resp. rate 22, height 5' 7"  (1.702 m), weight 65 kg (143 lb 4.8 oz), SpO2 96.00%.  Intake/Output Summary (Last 24 hours) at 05/07/13 1503 Last data filed at 05/07/13 1300  Gross per 24 hour  Intake    480 ml  Output   1500 ml  Net  -1020 ml     Exam: General: No acute respiratory distress, mild confusion noted, wearing back brace Lungs: Clear to auscultation bilaterally without wheezes or crackles Cardiovascular: Regular rate and rhythm without murmur gallop or rub normal S1 and S2 Abdomen: Nontender, nondistended, soft, bowel sounds positive, no rebound, no ascites, no appreciable mass Extremities: No significant cyanosis, clubbing, or edema bilateral lower extremities  Data Reviewed: Basic Metabolic Panel:  Recent Labs Lab 05/06/13 1320 05/07/13 0311  NA 137 137  K 3.8 4.2  CL 97 98  CO2 26 24  GLUCOSE 97 216*  BUN 14 18  CREATININE 1.47* 0.84  CALCIUM 8.9 8.2*   Liver Function Tests:  Recent Labs Lab 05/06/13 1320  AST 18   ALT 9  ALKPHOS 99  BILITOT 0.6  PROT 6.4  ALBUMIN 3.1*   No results found for this basename: LIPASE, AMYLASE,  in the last 168 hours No results found for this basename: AMMONIA,  in the last 168 hours CBC:  Recent Labs Lab 05/06/13 1320 05/07/13 0311  WBC 7.2 3.6*  NEUTROABS 3.6  --   HGB 10.9* 9.8*  HCT 33.6* 30.0*  MCV 88.7 87.0  PLT 198 207   Cardiac Enzymes:  Recent Labs Lab 05/06/13 1321  TROPONINI <0.30   BNP (last 3 results)  Recent Labs  05/06/13 1321  PROBNP 306.1*   CBG: No results found for this basename: GLUCAP,  in the last 168 hours  No results found for this or any previous visit (from the past 240 hour(s)).   Studies:  Recent x-ray studies have been reviewed in detail by the Attending Physician  Scheduled Meds:  Scheduled Meds: . aspirin EC  81 mg Oral q morning - 10a  . colchicine  0.6 mg Oral BID  . docusate sodium  100 mg Oral BID  . enoxaparin (LOVENOX) injection  40 mg Subcutaneous Q24H  . feeding supplement (ENSURE COMPLETE)  237 mL Oral Q24H  . FLUoxetine  40 mg Oral BID  . fluticasone  2 spray Each Nare QHS  . gabapentin  300 mg Oral TID  . pantoprazole  40 mg Oral BID  . potassium chloride SA  20 mEq Oral Daily  . predniSONE  5 mg Oral QPM  . simvastatin  40 mg Oral Q lunch  . tiotropium  18 mcg Inhalation Daily  . traZODone  50 mg Oral QHS   Continuous Infusions:   Time spent on care of this patient: >35 min   Debbe Odea, MD  [facility] Office  [phone] Pager - Text Page per Shea Evans as per below:  On-Call/Text Page:      [email or web address]      password [email or web address]  If 7PM-7AM, please contact night-coverage [email or web address] Password [email or web address] 05/07/2013, 3:03 PM   LOS: 1 day

## 2013-05-07 NOTE — Progress Notes (Signed)
Utilization review completed.  

## 2013-05-07 NOTE — Consult Note (Signed)
WOC wound consult note Reason for Consult: Dehisced surgical site on lumbar spine.  Wound type: Dehisced surgical site.  Measurement: 6 cm x 3 cm x 1 cm Wound bed: 100% pale pink wound bed.  Drainage (amount, consistency, odor) Moderate serous drainage, no odor.  Periwound: Intact Dressing procedure/placement/frequency: Patient had spinal surgery in November 2014 and states wound site opened back up.  Has completed multiple courses of antibiotics and is now requiring wound care to promote healing.   Cleanse wound with NS and pat gently dry.  Apply moistened Aquacel Ag silver hydrofiber to wound bed.  Top with 4x4 dry gauze and secure with occlusive transparent film. Change every 3 days, Mon-Wed-Fri. Will not follow at this time, but will remain available to patient, nursing and medical teams.  Please re-consult if needed.  Domenic Moras RN BSN [place] Pager [phone]

## 2013-05-07 NOTE — Progress Notes (Signed)
NURSING PROGRESS NOTE  [name] [id number] Transfer Data: 05/07/2013 3:07 PM Attending Provider: Debbe Odea, MD MEB:RAXENMM Norins, MD Code Status: FULL  [name] is a 68 y.o. male patient transferred from 3S -No acute distress noted.  -No complaints of shortness of breath.  -No complaints of chest pain.   Blood pressure 134/84, pulse 81, temperature 97.5 F (36.4 C), temperature source Oral, resp. rate 22, height 5' 7"  (1.702 m), weight 65 kg (143 lb 4.8 oz), SpO2 96.00%.   IV Fluids:  IV in place, occlusive dsg intact without redness, IV cath antecubital left, condition patent and no redness none.   Allergies:  Morphine and related; Penicillins; Cefixime; and Percocet  Past Medical History:   has a past medical history of colonic polyps; Diverticulosis; Hypertension; Osteoarthritis; Hemochromatosis; CAD (coronary artery disease); Allergic rhinitis; colonoscopy; RA (rheumatoid arthritis); COPD (chronic obstructive pulmonary disease); PONV (postoperative nausea and vomiting); Dysrhythmia (01-24-12); Depression; Anxiety; and GERD (gastroesophageal reflux disease).  Past Surgical History:   has past surgical history that includes Appendectomy; Inguinal hernia repair (2008); Total knee arthroplasty; Tonsillectomy; shoulder preplacement (2008); PTCA w/ coated stent LAD; Foot surgery (11-08); reconstruction of ankle (6-09); L4-5 diskectomy w/ fusion, cage placement and rods (12-10); Partial knee arthroplasty; Total shoulder replacement; Joint replacement; Cataract extraction, bilateral (01-24-12); ORIF shoulder fracture (02/06/2012); Harvest bone graft (02/06/2012); Hardware Removal (03/09/2012); Cardiac catheterization (2006); Shoulder arthroscopy (2013); Carpal tunnel release; Ankle surgery (Right, 2008); dg toes*r*; Posterior lumbar fusion 4 level (N/A, 03/02/2013); and Lumbar wound debridement (N/A, 03/17/2013).  Social History:   reports that he has never smoked. He has  never used smokeless tobacco. He reports that he does not drink alcohol or use illicit drugs.  Skin: Open incision on medial back. No signs of infection. Gauze and pink foam dressing reinforced over site. Awaiting WOC consult for orders.   Patient/Family orientated to room. Information packet given to patient/family. Admission inpatient armband information verified with patient/family to include name and date of birth and placed on patient arm. Side rails up x 2, fall assessment and education completed with patient/family. Patient/family able to verbalize understanding of risk associated with falls and verbalized understanding to call for assistance before getting out of bed. Call light within reach. Patient/family able to voice and demonstrate understanding of unit orientation instructions.    Will continue to evaluate and treat per MD orders.

## 2013-05-07 NOTE — Progress Notes (Signed)
INITIAL NUTRITION ASSESSMENT  DOCUMENTATION CODES Per approved criteria  -Not Applicable   INTERVENTION: 1. Ensure Complete daily. 8 oz provides 350 kcal, 13 g protein 2. Encourage adequate PO intake  NUTRITION DIAGNOSIS: Inadequate oral intake related to decreased appetite as evidenced by ability to eat only 2 meals daily.   Goal: Patient will meet >/=90% of estimated nutrition needs  Monitor:  PO intake, weight, labs, I/Os  Reason for Assessment: Malnutrition screening tool  68 y.o. male  Admitting Dx: Acute respiratory failure with hypoxia  ASSESSMENT: Patient with history of COPD, hypertension, hyperlipidemia, admitted with confusion, acute respiratory failure likely secondary to narcotics overdose. He has recent previous admissions for lumbar fusion and post op wound infection.   Patient previously diagnosed with severe malnutrition with 13% weight loss in 1 month and poor PO intake. Patient reports that his appetite has improved significantly since then. However, he is not able to eat 3 full meals daily. He is drinking Ensure shakes at home. Intake this morning at breakfast was 75%. His weight is increased 6 pounds since earlier this month.   Height: Ht Readings from Last 1 Encounters:  05/06/13 5' 7"  (1.702 m)    Weight: Wt Readings from Last 1 Encounters:  05/06/13 143 lb 4.8 oz (65 kg)    Ideal Body Weight: 148 pounds  % Ideal Body Weight: 97%  Wt Readings from Last 10 Encounters:  05/06/13 143 lb 4.8 oz (65 kg)  05/05/13 141 lb (63.957 kg)  05/01/13 144 lb (65.318 kg)  04/12/13 137 lb (62.143 kg)  04/12/13 138 lb 8 oz (62.823 kg)  03/16/13 158 lb 4.6 oz (71.8 kg)  03/16/13 158 lb 4.6 oz (71.8 kg)  03/02/13 158 lb 4.6 oz (71.8 kg)  03/02/13 158 lb 4.6 oz (71.8 kg)  02/23/13 150 lb (68.04 kg)    Usual Body Weight: 158 pounds  % Usual Body Weight: 91%  BMI:  Body mass index is 22.44 kg/(m^2). Patient is normal weight.   Estimated Nutritional  Needs: Kcal: 1800-1950 kcal Protein: 80-90 g Fluid: 1.9-2 L/day  Skin: Incision/wound back  Diet Order: General  EDUCATION NEEDS: -No education needs identified at this time   Intake/Output Summary (Last 24 hours) at 05/07/13 1245 Last data filed at 05/07/13 1154  Gross per 24 hour  Intake    240 ml  Output   1500 ml  Net  -1260 ml    Last BM: PTA   Labs:   Recent Labs Lab 05/06/13 1320 05/07/13 0311  NA 137 137  K 3.8 4.2  CL 97 98  CO2 26 24  BUN 14 18  CREATININE 1.47* 0.84  CALCIUM 8.9 8.2*  GLUCOSE 97 216*    CBG (last 3)  No results found for this basename: GLUCAP,  in the last 72 hours  Scheduled Meds: . aspirin EC  81 mg Oral q morning - 10a  . colchicine  0.6 mg Oral BID  . docusate sodium  100 mg Oral BID  . enoxaparin (LOVENOX) injection  40 mg Subcutaneous Q24H  . FLUoxetine  40 mg Oral BID  . fluticasone  2 spray Each Nare QHS  . gabapentin  300 mg Oral TID  . pantoprazole  40 mg Oral BID  . potassium chloride SA  20 mEq Oral Daily  . predniSONE  5 mg Oral QPM  . simvastatin  40 mg Oral Q lunch  . tiotropium  18 mcg Inhalation Daily  . traZODone  50 mg Oral QHS  Continuous Infusions:   Past Medical History  Diagnosis Date  . Hx of colonic polyps   . Diverticulosis   . Hypertension   . Osteoarthritis   . 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 CAD (coronary artery disease)     minimal coronary plaque in the LAD and right coronary system. 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 7-06. Last nuclear stress 11-17-06 fixed anterior/ inferior defect, no inducible ischemia, EF 81%  . Allergic rhinitis   . Hx of colonoscopy   . RA (rheumatoid arthritis)   . COPD (chronic obstructive pulmonary disease)   . PONV (postoperative nausea and vomiting)   . Dysrhythmia 01-24-12    past hx. A.Fib x1 episode-responded to med.  .  Depression   . Anxiety     pt. feels its related to the medicine that he takes   . GERD (gastroesophageal reflux disease)     Past Surgical History  Procedure Laterality Date  . Appendectomy    . Inguinal hernia repair  2008    Right, left remotely  . Total knee arthroplasty      right knee partial 2002  . Tonsillectomy    . Shoulder preplacement  2008    left, partial  . Ptca w/ coated stent lad      Cx secondary to tear  . Foot surgery  11-08    for removal of bone spurs- right foot  . Reconstruction of ankle  6-09    Right- [facility]  . L4-5 diskectomy w/ fusion, cage placement and rods  12-10    [place]  . Partial knee arthroplasty      left  . Total shoulder replacement      right  . Joint replacement    . Cataract extraction, bilateral  01-24-12    Bilateral  . Orif shoulder fracture  02/06/2012    Procedure: OPEN REDUCTION INTERNAL FIXATION (ORIF) SHOULDER FRACTURE;  Surgeon: Nita Sells, MD;  Location: [facility];  Service: Orthopedics;  Laterality: Left;  ORIF of a Left Shoulder Fracture with  Iliac Crest Bone Graft aspiration   . Harvest bone graft  02/06/2012    Procedure: HARVEST ILIAC BONE GRAFT;  Surgeon: Nita Sells, MD;  Location: [facility];  Service: Orthopedics;;  bone marrow aspirqation   . Hardware removal  03/09/2012    Procedure: HARDWARE REMOVAL;  Surgeon: Nita Sells, MD;  Location: [place];  Service: Orthopedics;  Laterality: Left;  Hardware Removal from Left Shoulder  . Cardiac catheterization  2006    coronary stents- no problems  . Shoulder arthroscopy  2013  . Carpal tunnel release      R hand  . Ankle surgery Right 2008    at [facility], replacement   . Dg toes*r*      toe surgery- 07/2012  . Posterior lumbar fusion 4 level N/A 03/02/2013    Procedure: Lumbar One to Sacral One Posterior lumbar interbody fusion;  Surgeon: Floyce Stakes, MD;  Location: [place] [facility];  Service: Neurosurgery;  Laterality:  N/A;  L1 to S1 Posterior lumbar interbody fusion  . Lumbar wound debridement N/A 03/17/2013    Procedure: Incision and drainage of superficial lumbar wound;  Surgeon: Floyce Stakes, MD;  Location: [place] [facility];  Service: Neurosurgery;  Laterality: N/A;  Incision and drainage of superficial lumbar wound  Larey Seat, RD, LDN Pager #: [phone] After-Hours Pager #: [phone]

## 2013-05-07 NOTE — Progress Notes (Addendum)
Visitor expressed concerns that pt s/o [name] had left the gas stove on last Thursday when he went to visit patient. Visitor stated the natural gas smell was so strong when the front door was open that his wife would not enter the house. Friend stated that he was concerned this was causing some of the confusion, then stated that he thinks the s/o might have left the gas stove on on purpose to "get rid of" [name]. Friend stated that [name] said she just forgot it was on and it wasn't that bad; friend also stated that [name] was in and out of the house running errands that day.  Pt does have home health RN every other day to change dressing on back, as well as PT.   RN reported this information to Dr. Wynelle Cleveland in person.

## 2013-05-08 ENCOUNTER — Inpatient Hospital Stay (HOSPITAL_COMMUNITY): Payer: Medicare Other

## 2013-05-08 DIAGNOSIS — R4182 Altered mental status, unspecified: Secondary | ICD-10-CM

## 2013-05-08 MED ORDER — METHOCARBAMOL 500 MG PO TABS
500.0000 mg | ORAL_TABLET | Freq: Once | ORAL | Status: AC
  Administered 2013-05-08: 500 mg via ORAL
  Filled 2013-05-08: qty 1

## 2013-05-08 MED ORDER — HYDROMORPHONE HCL 2 MG PO TABS
1.0000 mg | ORAL_TABLET | Freq: Four times a day (QID) | ORAL | Status: DC | PRN
  Administered 2013-05-08 – 2013-05-09 (×4): 1 mg via ORAL
  Filled 2013-05-08 (×4): qty 1

## 2013-05-08 NOTE — Progress Notes (Signed)
Spoke with [name] (wife) about concerns regarding patient's disposition at home and changes with his voice.  She feels that her husband is not benefitting from the therapy that he is receiving while at home and that it is resulting in readmissions due to falls.  Wife feels very overwhelmed with home health services and feels like he would benefit if he received care with rehab in the hospital or a facility.  Patient and wife also voiced concerns about his ability to speak.  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  Both are requesting an outpatient speech therapy evaluation to help patient regain his voice.

## 2013-05-08 NOTE — Evaluation (Addendum)
Physical Therapy Evaluation Patient Details Name: [name] RECORD MRN: [id number] DOB: 11/05/1945 Today's Date: 05/08/2013 Time: 1550-1611 PT Time Calculation (min): 21 min  PT Assessment / Plan / Recommendation History of Present Illness  68 year old male with past medical history of RA arthritis on steroids (Humira d/c'd recently due to infection), posterior lumbar fusion (03/19/13) with post-op pseudomonas infection s/p debridement and treatment with 40 days of Cefepime ending Jan 23rd (developed hives towards the end off treatment but completed course). Also, has h/o COPD, hypertension, dyslipidemia who presented to [facility] ED 05/06/2013 with confusion. He was recently discharged on 1/17 after and admission for hypotension and SBO which was treated conservatively. Pt with multiple falls at home per wife. Pt brought back to the ED secondary to confusion, lethargy, difficulty breathing and SBP <100.   Clinical Impression  Pt adm due to the above and has had multiple recent admission. Presents with limitations in functional mobility and independence with mobility secondary to deficits indicated below (see PT problem list). Pt to benefit from skilled PT to address deficits and increase independence with mobility prior to returning home with wife. Pt was having HHPT upon admission; pt unsure if he wants HHPT at this time. Plans to Discuss with MD.     PT Assessment  Patient needs continued PT services    Follow Up Recommendations  Home health PT;Supervision - Intermittent    Does the patient have the potential to tolerate intense rehabilitation      Barriers to Discharge        Equipment Recommendations  None recommended by PT    Recommendations for Other Services     Frequency Min 3X/week    Precautions / Restrictions Precautions Precautions: Back Precaution Comments: able to don back brace with set up; no orders at this time regarding brace; per pt he wears it only when OOB Required  Braces or Orthoses: Spinal Brace Spinal Brace: Lumbar corset;Applied in sitting position Restrictions Weight Bearing Restrictions: No   Pertinent Vitals/Pain 6/10; patient repositioned for comfort       Mobility  Bed Mobility Overal bed mobility: Needs Assistance Bed Mobility: Rolling;Sidelying to Sit;Sit to Sidelying Rolling: Supervision Sidelying to sit: Supervision Sit to sidelying: Supervision General bed mobility comments: effortful; cues for log rolling technique; pt attempting to twist to get in/out of bed  Transfers Overall transfer level: Needs assistance Equipment used: Rolling walker (2 wheeled) Transfers: Sit to/from Stand Sit to Stand: Supervision General transfer comment: cues for hand placement and safety with RW Ambulation/Gait Ambulation/Gait assistance: Min guard Ambulation Distance (Feet): 200 Feet Assistive device: Rolling walker (2 wheeled) Gait Pattern/deviations: Wide base of support;Decreased stride length;Step-through pattern (Rt LE ER ) Gait velocity: speed WFL though not measured today Gait velocity interpretation: at or above normal speed for age/gender General Gait Details: pt slightly unsteady with gt secondary to deformities; min guard for safety; cues to adhere to back precautions with turning and ambulating          PT Diagnosis: Abnormality of gait;Generalized weakness  PT Problem List: Decreased strength;Decreased activity tolerance;Decreased balance;Decreased knowledge of use of DME;Decreased safety awareness;Decreased knowledge of precautions;Decreased mobility;Pain PT Treatment Interventions: DME instruction;Gait training;Functional mobility training;Therapeutic activities;Patient/family education;Stair training;Therapeutic exercise;Balance training;Neuromuscular re-education     PT Goals(Current goals can be found in the care plan section) Acute Rehab PT Goals Patient Stated Goal: to go home PT Goal Formulation: With patient Time For  Goal Achievement: 05/22/13 Potential to Achieve Goals: Good  Visit Information  Last  PT Received On: 05/08/13 Assistance Needed: +1 History of Present Illness: 68 year old male with past medical history of RA arthritis on steroids (Humira d/c'd recently due to infection), posterior lumbar fusion (03/19/13) with post-op pseudomonas infection s/p debridement and treatment with 40 days of Cefepime ending Jan 23rd (developed hives towards the end off treatment but completed course). Also, has h/o COPD, hypertension, dyslipidemia who presented to [facility] ED 05/06/2013 with confusion. He was recently discharged on 1/17 after and admission for hypotension and SBO which was treated conservatively.       Prior [place] expects to be discharged to:: Private residence Living Arrangements: Spouse/significant other Available Help at Discharge: Family;Available PRN/intermittently Type of Home: House Home Access: Stairs to enter [employer] of Steps: 5 Entrance Stairs-Rails: Right Home Layout: Two level;Able to live on main level with bedroom/bathroom Home Equipment: Gilford Rile - 2 wheels;Cane - single point;Shower seat;Hand held shower head Additional Comments: pt has walk in shower with seat; has not gotten in shower since surgery on back in November   Lives With: Significant other Prior Function Level of Independence: Independent Comments: prior to back surgery ; since Novemeber pt has required (A) with washing his back and dressing LEs Communication Communication: Other (comment) (pt with decreased voice; whispers for communication ) Dominant Hand: Right    Cognition  Cognition Arousal/Alertness: Awake/alert Behavior During Therapy: WFL for tasks assessed/performed Overall Cognitive Status: Within Functional Limits for tasks assessed    Extremity/Trunk Assessment Upper Extremity Assessment Upper Extremity Assessment: Overall WFL for tasks assessed RUE  Deficits / Details: RA in bil hands  Lower Extremity Assessment Lower Extremity Assessment: Overall WFL for tasks assessed RLE Deficits / Details: pt has had an ankle joint replacement and he reports that RA is the worst in his right foot, this causes him to ambulate with abnormal gait. Pt c/o numbness in bil feet  Cervical / Trunk Assessment Cervical / Trunk Assessment: Normal   Balance Balance Overall balance assessment: Needs assistance;History of Falls Sitting-balance support: Feet supported;No upper extremity supported Sitting balance-Leahy Scale: Normal Standing balance support: During functional activity;Bilateral upper extremity supported Standing balance-Leahy Scale: Fair  End of Session PT - End of Session Equipment Utilized During Treatment: Gait belt;Back brace Activity Tolerance: Patient tolerated treatment well Patient left: in bed;with call bell/phone within reach Nurse Communication: Mobility status  GP     Gustavus Bryant, [place] 05/08/2013, 4:48 PM

## 2013-05-08 NOTE — Progress Notes (Signed)
[facility] Progress Note     [name]:[id number] DOB: 17-Dec-1945 DOA: 05/06/2013 PCP: Adella Hare, MD  Brief narrative:    68 year old male with past medical history of RA arthritis on steroids (Humira d/c'd recently due to infection), posterior lumbar fusion (03/19/13) with post-op pseudomonas infection s/p debridement and treatment with 40 days of Cefepime ending Jan 23rd (developed hives towards the end off treatment but completed course). Also, has h/o COPD, hypertension, dyslipidemia who presented to [facility] ED 05/06/2013 with confusion. He was recently discharged on 1/17 after and admission for hypotension and SBO which was treated conservatively.   Per the patient's wife, he has been falling lately - 8 falls in about 1 wk. She has been cutting back on his pain medications and Valium but he was noted to be having difficulty breathing, lethargic, confused and hypotensive with SBP < 100 was sent to the ER. He was placed on BiPAP by EMS which was removed by the ER.    Dr Wynelle Cleveland spoke with his wife extensively on 05-07-13. She also notes a weight loss of about 25 lbs since his surgery for which he has been instructed by me to follow with his primary care physician post discharge..    Subjective:  He is in bed, denies any headache, no chest abdominal pain, no fever chills, no new focal weakness. Has some generalized weakness and deconditioning.    Assessment/Plan:      Acute encephalopathy/ Hypotension/ acute respiratory failure - due to unintentional polypharmacy in the setting of dehydration causing acute renal failure. Now close to baseline except generalized weakness which is accounted for recent multiple surgeries and hospitalizations. Will increase activity, PT to evaluate. Cutdown on narcotics and benzodiazepines.     Hypotension - d/c HCTZ, Losartan and Lasix- due to weight loss he may now be normotensive- follow    ARF - acute and possible due to  hypotension- note GFR dropped from > 90 to high 40s - improved    Back pain - resume lower dose Neurontin, Dialudid for severe pain and Hydrocodone for moderate pain PRN    Depression - wife recently stopped Prozac- will resume as he will likely have withdrawal symptoms in a couple of weeks -can taper slowly as outpt    Weight loss - encourage wife to include higher amount of carbs in diet- has mostly been giving him protein, patient counseled to follow with PCP outpatient post discharge.      DYSLIPIDEMIA - cont simvastatin      PSEUDOGOUT - colchicine    RA - cont Prednisone - holding Humira    Falls - PT eval-     Lumbar fusion with infection - requesting WOC eval for persistent wound on back, done     Code Status: Full code  Family Communication: with wife Disposition Plan: transfer to med/surg- PT eval  Consultants: none  Procedures: none  Antibiotics: none  DVT prophylaxis: Lovenox  Objective: Blood pressure 154/83, pulse 83, temperature 97.5 F (36.4 C), temperature source Oral, resp. rate 18, height 5' 7"  (1.702 m), weight 65 kg (143 lb 4.8 oz), SpO2 94.00%.  Intake/Output Summary (Last 24 hours) at 05/08/13 1101 Last data filed at 05/08/13 0900  Gross per 24 hour  Intake    720 ml  Output   1225 ml  Net   -505 ml     Exam:  General: No acute respiratory distress, mild confusion noted, back postop scar under bandage, surrounding skin looks clean. Neurological :  AAOx3, NO FN deficits, mild Gen weakness Lungs: Clear to auscultation bilaterally without wheezes or crackles Cardiovascular: Regular rate and rhythm without murmur gallop or rub normal S1 and S2 Abdomen: Nontender, nondistended, soft, bowel sounds positive, no rebound, no ascites, no appreciable mass Extremities: No significant cyanosis, clubbing, or edema bilateral lower extremities  Data Reviewed: Basic Metabolic Panel:  Recent Labs Lab 05/06/13 1320  05/07/13 0311  NA 137 137  K 3.8 4.2  CL 97 98  CO2 26 24  GLUCOSE 97 216*  BUN 14 18  CREATININE 1.47* 0.84  CALCIUM 8.9 8.2*   Liver Function Tests:  Recent Labs Lab 05/06/13 1320  AST 18  ALT 9  ALKPHOS 99  BILITOT 0.6  PROT 6.4  ALBUMIN 3.1*   No results found for this basename: LIPASE, AMYLASE,  in the last 168 hours No results found for this basename: AMMONIA,  in the last 168 hours  CBC:  Recent Labs Lab 05/06/13 1320 05/07/13 0311  WBC 7.2 3.6*  NEUTROABS 3.6  --   HGB 10.9* 9.8*  HCT 33.6* 30.0*  MCV 88.7 87.0  PLT 198 207   Cardiac Enzymes:  Recent Labs Lab 05/06/13 1321  TROPONINI <0.30   BNP (last 3 results)  Recent Labs  05/06/13 1321  PROBNP 306.1*   CBG: No results found for this basename: GLUCAP,  in the last 168 hours  No results found for this or any previous visit (from the past 240 hour(s)).   Studies:  Recent x-ray studies have been reviewed in detail by the Attending Physician  Scheduled Meds:  Scheduled Meds: . aspirin EC  81 mg Oral q morning - 10a  . colchicine  0.6 mg Oral BID  . docusate sodium  100 mg Oral BID  . enoxaparin (LOVENOX) injection  40 mg Subcutaneous Q24H  . feeding supplement (ENSURE COMPLETE)  237 mL Oral Q24H  . FLUoxetine  40 mg Oral BID  . fluticasone  2 spray Each Nare QHS  . gabapentin  300 mg Oral TID  . pantoprazole  40 mg Oral BID  . potassium chloride SA  20 mEq Oral Daily  . predniSONE  5 mg Oral QPM  . psyllium  1 packet Oral Daily  . simvastatin  40 mg Oral Q lunch  . tiotropium  18 mcg Inhalation Daily  . traZODone  50 mg Oral QHS   Continuous Infusions:   Time spent on care of this patient: >35 min   Thurnell Lose, MD  [facility] Office  [phone] Pager - Text Page per Shea Evans as per below: Password [email or web address] 05/08/2013, 11:01 AM   LOS: 2 days

## 2013-05-09 MED ORDER — ENSURE COMPLETE PO LIQD: 237.0000 mL | ORAL | Status: DC

## 2013-05-09 MED ORDER — HYDROMORPHONE HCL 4 MG PO TABS: 4.0000 mg | ORAL_TABLET | Freq: Three times a day (TID) | ORAL | Status: DC | PRN

## 2013-05-09 NOTE — Progress Notes (Signed)
Pt. Walked 100 ft. With standard walker and back brace. Tolerated well. Will continue to monitor pt.

## 2013-05-09 NOTE — Progress Notes (Signed)
[name] discharged Home with wife per MD order.  Discharge instructions reviewed and discussed with the patient and pt. wife, all questions and concerns answered. Copy of instructions, care notes for new medications & diagnosis given to patient.    Medication List    STOP taking these medications       diazepam 5 MG tablet  Commonly known as:  VALIUM     hydrOXYzine 25 MG tablet  Commonly known as:  ATARAX/VISTARIL     zolpidem 5 MG tablet  Commonly known as:  AMBIEN      TAKE these medications       Aclidinium Bromide 400 MCG/ACT Aepb  Inhale 2 puffs into the lungs at bedtime.     Aclidinium Bromide 400 MCG/ACT Aepb  Commonly known as:  TUDORZA PRESSAIR  Inhale 1 puff into the lungs 2 (two) times daily.     ALPRAZolam 0.5 MG tablet  Commonly known as:  XANAX  Take 1 tablet (0.5 mg total) by mouth 3 (three) times daily as needed for anxiety or sleep.     aspirin EC 81 MG EC tablet  Generic drug:  aspirin  Take 81 mg by mouth every morning.     celecoxib 200 MG capsule  Commonly known as:  CELEBREX  Take 200 mg by mouth 2 (two) times daily.     cholecalciferol 1000 UNITS tablet  Commonly known as:  VITAMIN D  Take 1 tablet (1,000 Units total) by mouth daily.     ciprofloxacin 500 MG tablet  Commonly known as:  CIPRO  Take 1 tablet (500 mg total) by mouth 2 (two) times daily.     colchicine 0.6 MG tablet  Take 0.6 mg by mouth 2 (two) times daily.     [employer] MELATONIN 5 MG Tabs  Generic drug:  Melatonin  Take 5 mg by mouth at bedtime.     dextrose 5 % SOLN 50 mL with ceFEPIme 2 G SOLR 2 g  Inject 2 g into the vein every 8 (eight) hours. Till January 24th 2015.     docusate sodium 100 MG capsule  Commonly known as:  COLACE  Take 1 capsule (100 mg total) by mouth 2 (two) times daily.     ENSURE COMPLETE PO  Take 0.5 Cans by mouth 2 (two) times daily.     feeding supplement (ENSURE COMPLETE) Liqd  Take 237 mLs by mouth daily.     FLUoxetine 40 MG  capsule  Commonly known as:  PROZAC  Take 40 mg by mouth 2 (two) times daily.     fluticasone 50 MCG/ACT nasal spray  Commonly known as:  FLONASE  Place 2 sprays into both nostrils at bedtime.     furosemide 20 MG tablet  Commonly known as:  LASIX  Take 20 mg by mouth daily.     gabapentin 300 MG capsule  Commonly known as:  NEURONTIN  Take 300-600 mg by mouth 4 (four) times daily. 600 mg every morning, 300 mg at noon, 600 mg at bedtime, and 300 mg at 1 AM     hydrochlorothiazide 25 MG tablet  Commonly known as:  HYDRODIURIL  Take 25 mg by mouth daily before breakfast.     HYDROmorphone 4 MG tablet  Commonly known as:  DILAUDID  Take 1 tablet (4 mg total) by mouth every 8 (eight) hours as needed for moderate pain or severe pain.     losartan 50 MG tablet  Commonly known as:  COZAAR  Take 50 mg by mouth daily.     OxyCODONE 40 mg T12a 12 hr tablet  Commonly known as:  OXYCONTIN  Take 40 mg by mouth 2 (two) times daily as needed (pain).     pantoprazole 40 MG tablet  Commonly known as:  PROTONIX  Take 40 mg by mouth 2 (two) times daily.     polyethylene glycol packet  Commonly known as:  MIRALAX / GLYCOLAX  Take 17 g by mouth daily as needed for moderate constipation.     potassium chloride SA 20 MEQ tablet  Commonly known as:  K-DUR,KLOR-CON  Take 20 mEq by mouth daily.     predniSONE 5 MG tablet  Commonly known as:  DELTASONE  Take 5 mg by mouth every evening.     simvastatin 40 MG tablet  Commonly known as:  ZOCOR  Take 40 mg by mouth daily with lunch.     SYSTANE ULTRA 0.4-0.3 % Soln  Generic drug:  Polyethyl Glycol-Propyl Glycol  Place 2 drops into both eyes 2 (two) times daily as needed (dry eyes).     traZODone 50 MG tablet  Commonly known as:  DESYREL  Take 50 mg by mouth at bedtime as needed for sleep (take if the 100 mg dose is not effective.).        Patients skin is clean, dry and with surgical dsg to back CDI. IV site discontinued and  catheter remains intact. Site without signs and symptoms of complications. Dressing and pressure applied.  Patient escorted to car by NT in a wheelchair,  no distress noted upon discharge.  Wynetta Emery, Fortune Brannigan C 05/09/2013 12:54 PM

## 2013-05-09 NOTE — Progress Notes (Signed)
   CARE MANAGEMENT NOTE 05/09/2013  Patient:  CONG, HIGHTOWER   Account Number:  [id number]  Date Initiated:  05/07/2013  Documentation initiated by:  Marvetta Gibbons  Subjective/Objective Assessment:   Pt admitted with AMS- opioid overdose     Action/Plan:   PTA pt lived at home with SO- was active with [facility] for HH-RN/PT- will need resumption orders for discharge   Anticipated DC Date:  05/08/2013   Anticipated DC Plan:  [place]  CM consult      Choice offered to / List presented to:             Status of service:  Completed, signed off Medicare Important Message given?   (If response is "NO", the following Medicare IM given date fields will be blank) Date Medicare IM given:   Date Additional Medicare IM given:    Discharge Disposition:  HOME/SELF CARE  Per UR Regulation:  Reviewed for med. necessity/level of care/duration of stay  If discussed at [place] of Stay Meetings, dates discussed:    Comments:  05/09/13 10:45 CM spoke with pt and spouse in room concerning [place].  Explained to pt insurance would not cover [facility] if pt is able to go to ordered outpt therapy.  Pt and wife, [name], understand and prefer outpt therapy.  MD gave orders for outpt PT/ST and wound to be followed at follow up visit.  Referral/order faxed to [facility] and map given to pt with instruction to call after 3:00 pm2/2/15  if [facility] has not called by then to schedule an appt.  No other CM needs were communicated.  Mariane Masters, BSN, CM [phone].

## 2013-05-09 NOTE — Discharge Instructions (Signed)
Follow with Primary MD Adella Hare, MD and your Back Surgeon in 2 days   Get CBC, CMP, checked 2 days by Primary MD and again as instructed by your Primary MD. Get a 2 view Chest X ray done next visit .   Activity: As tolerated with Full fall precautions use walker/cane & assistance as needed, wear Back brace as told by your Surgeon   Disposition Home with HHPT   Diet: Heart Healthy , feeding assistance and aspiration precautions if needed.  For Heart failure patients - Check your Weight same time everyday, if you gain over 2 pounds, or you develop in leg swelling, experience more shortness of breath or chest pain, call your Primary MD immediately. Follow Cardiac Low Salt Diet and 1.8 lit/day fluid restriction.   On your next visit with her primary care physician please Get Medicines reviewed and adjusted.  Please request your Prim.MD to go over all Hospital Tests and Procedure/Radiological results at the follow up, please get all Hospital records sent to your Prim MD by signing hospital release before you go home.   If you experience worsening of your admission symptoms, develop shortness of breath, life threatening emergency, suicidal or homicidal thoughts you must seek medical attention immediately by calling 911 or calling your MD immediately  if symptoms less severe.  You Must read complete instructions/literature along with all the possible adverse reactions/side effects for all the Medicines you take and that have been prescribed to you. Take any new Medicines after you have completely understood and accpet all the possible adverse reactions/side effects.   Do not drive and provide baby sitting services if your were admitted for syncope or siezures until you have seen by Primary MD or a Neurologist and advised to do so again.  Do not drive when taking Pain medications.    Do not take more than prescribed Pain, Sleep and Anxiety Medications  Special Instructions: If you have  smoked or chewed Tobacco  in the last 2 yrs please stop smoking, stop any regular Alcohol  and or any Recreational drug use.  Wear Seat belts while driving.   Please note  You were cared for by a hospitalist during your hospital stay. If you have any questions about your discharge medications or the care you received while you were in the hospital after you are discharged, you can call the unit and asked to speak with the hospitalist on call if the hospitalist that took care of you is not available. Once you are discharged, your primary care physician will handle any further medical issues. Please note that NO REFILLS for any discharge medications will be authorized once you are discharged, as it is imperative that you return to your primary care physician (or establish a relationship with a primary care physician if you do not have one) for your aftercare needs so that they can reassess your need for medications and monitor your lab values.

## 2013-05-09 NOTE — Discharge Summary (Signed)
[name], is a 68 y.o. male  DOB 07-17-1945  MRN [id number].  Admission date:  05/06/2013  Admitting Physician  Robbie Lis, MD  Discharge Date:  05/09/2013   Primary MD  Adella Hare, MD  Recommendations for primary care physician for things to follow:   Follow clinically   Admission Diagnosis  Unspecified essential hypertension [401.9] Acute renal failure [584.9] Mental status change [780.97] Opioid overdose [965.09, E980.0] Acute respiratory failure with hypoxia [518.81]  Discharge Diagnosis   Unintentional polypharmacy  Principal Problem:   Acute encephalopathy Active Problems:   DYSLIPIDEMIA   PSEUDOGOUT   HYPERTENSION   Opioid overdose   Acute respiratory failure with hypoxia   COPD (chronic obstructive pulmonary disease)   Acute renal failure      Past Medical History  Diagnosis Date  . Hx of colonic polyps   . Diverticulosis   . Hypertension   . Osteoarthritis   . 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 CAD (coronary artery disease)     minimal coronary plaque in the LAD and right coronary system. 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 7-06. Last nuclear stress 11-17-06 fixed anterior/ inferior defect, no inducible ischemia, EF 81%  . Allergic rhinitis   . Hx of colonoscopy   . RA (rheumatoid arthritis)   . COPD (chronic obstructive pulmonary disease)   . PONV (postoperative nausea and vomiting)   . Dysrhythmia 01-24-12    past hx. A.Fib x1 episode-responded to med.  . Depression   . Anxiety     pt. feels its related to the medicine that he takes   . GERD (gastroesophageal reflux disease)     Past Surgical History  Procedure Laterality Date  . Appendectomy    . Inguinal  hernia repair  2008    Right, left remotely  . Total knee arthroplasty      right knee partial 2002  . Tonsillectomy    . Shoulder preplacement  2008    left, partial  . Ptca w/ coated stent lad      Cx secondary to tear  . Foot surgery  11-08    for removal of bone spurs- right foot  . Reconstruction of ankle  6-09    Right- [facility]  . L4-5 diskectomy w/ fusion, cage placement and rods  12-10    [place]  . Partial knee arthroplasty      left  . Total shoulder replacement      right  . Joint replacement    . Cataract extraction, bilateral  01-24-12    Bilateral  . Orif shoulder fracture  02/06/2012    Procedure: OPEN REDUCTION INTERNAL FIXATION (ORIF) SHOULDER FRACTURE;  Surgeon: Nita Sells, MD;  Location: [facility];  Service: Orthopedics;  Laterality: Left;  ORIF of a Left Shoulder Fracture with  Iliac Crest Bone Graft aspiration   . Harvest bone graft  02/06/2012  Procedure: HARVEST ILIAC BONE GRAFT;  Surgeon: Nita Sells, MD;  Location: [facility];  Service: Orthopedics;;  bone marrow aspirqation   . Hardware removal  03/09/2012    Procedure: HARDWARE REMOVAL;  Surgeon: Nita Sells, MD;  Location: [facility];  Service: Orthopedics;  Laterality: Left;  Hardware Removal from Left Shoulder  . Cardiac catheterization  2006    coronary stents- no problems  . Shoulder arthroscopy  2013  . Carpal tunnel release      R hand  . Ankle surgery Right 2008    at [facility], replacement   . Dg toes*r*      toe surgery- 07/2012  . Posterior lumbar fusion 4 level N/A 03/02/2013    Procedure: Lumbar One to Sacral One Posterior lumbar interbody fusion;  Surgeon: Floyce Stakes, MD;  Location: [place];  Service: Neurosurgery;  Laterality: N/A;  L1 to S1 Posterior lumbar interbody fusion  . Lumbar wound debridement N/A 03/17/2013    Procedure: Incision and drainage of superficial lumbar wound;  Surgeon: Floyce Stakes, MD;  Location: [place] [facility];   Service: Neurosurgery;  Laterality: N/A;  Incision and drainage of superficial lumbar wound     Discharge Condition: stable    Follow UP  Follow-up Information   Follow up with Adella Hare, MD. Schedule an appointment as soon as possible for a visit in 2 days. (and your Back surgeon)    Specialty:  Internal Medicine   Contact information:   [address] [place] [address] [phone]         Consults obtained -  W care   Discharge Medications      Medication List    STOP taking these medications       diazepam 5 MG tablet  Commonly known as:  VALIUM     hydrOXYzine 25 MG tablet  Commonly known as:  ATARAX/VISTARIL     zolpidem 5 MG tablet  Commonly known as:  AMBIEN      TAKE these medications       Aclidinium Bromide 400 MCG/ACT Aepb  Inhale 2 puffs into the lungs at bedtime.     Aclidinium Bromide 400 MCG/ACT Aepb  Commonly known as:  TUDORZA PRESSAIR  Inhale 1 puff into the lungs 2 (two) times daily.     ALPRAZolam 0.5 MG tablet  Commonly known as:  XANAX  Take 1 tablet (0.5 mg total) by mouth 3 (three) times daily as needed for anxiety or sleep.     aspirin EC 81 MG EC tablet  Generic drug:  aspirin  Take 81 mg by mouth every morning.     celecoxib 200 MG capsule  Commonly known as:  CELEBREX  Take 200 mg by mouth 2 (two) times daily.     cholecalciferol 1000 UNITS tablet  Commonly known as:  VITAMIN D  Take 1 tablet (1,000 Units total) by mouth daily.     ciprofloxacin 500 MG tablet  Commonly known as:  CIPRO  Take 1 tablet (500 mg total) by mouth 2 (two) times daily.     colchicine 0.6 MG tablet  Take 0.6 mg by mouth 2 (two) times daily.     [employer]S MELATONIN 5 MG Tabs  Generic drug:  Melatonin  Take 5 mg by mouth at bedtime.     dextrose 5 % SOLN 50 mL with ceFEPIme 2 G SOLR 2 g  Inject 2 g into the vein every 8 (eight) hours. Till January  24th 2015.     docusate sodium 100 MG capsule  Commonly known as:  COLACE  Take 1  capsule (100 mg total) by mouth 2 (two) times daily.     ENSURE COMPLETE PO  Take 0.5 Cans by mouth 2 (two) times daily.     feeding supplement (ENSURE COMPLETE) Liqd  Take 237 mLs by mouth daily.     FLUoxetine 40 MG capsule  Commonly known as:  PROZAC  Take 40 mg by mouth 2 (two) times daily.     fluticasone 50 MCG/ACT nasal spray  Commonly known as:  FLONASE  Place 2 sprays into both nostrils at bedtime.     furosemide 20 MG tablet  Commonly known as:  LASIX  Take 20 mg by mouth daily.     gabapentin 300 MG capsule  Commonly known as:  NEURONTIN  Take 300-600 mg by mouth 4 (four) times daily. 600 mg every morning, 300 mg at noon, 600 mg at bedtime, and 300 mg at 1 AM     hydrochlorothiazide 25 MG tablet  Commonly known as:  HYDRODIURIL  Take 25 mg by mouth daily before breakfast.     HYDROmorphone 4 MG tablet  Commonly known as:  DILAUDID  Take 1 tablet (4 mg total) by mouth every 8 (eight) hours as needed for moderate pain or severe pain.     losartan 50 MG tablet  Commonly known as:  COZAAR  Take 50 mg by mouth daily.     OxyCODONE 40 mg T12a 12 hr tablet  Commonly known as:  OXYCONTIN  Take 40 mg by mouth 2 (two) times daily as needed (pain).     pantoprazole 40 MG tablet  Commonly known as:  PROTONIX  Take 40 mg by mouth 2 (two) times daily.     polyethylene glycol packet  Commonly known as:  MIRALAX / GLYCOLAX  Take 17 g by mouth daily as needed for moderate constipation.     potassium chloride SA 20 MEQ tablet  Commonly known as:  K-DUR,KLOR-CON  Take 20 mEq by mouth daily.     predniSONE 5 MG tablet  Commonly known as:  DELTASONE  Take 5 mg by mouth every evening.     simvastatin 40 MG tablet  Commonly known as:  ZOCOR  Take 40 mg by mouth daily with lunch.     SYSTANE ULTRA 0.4-0.3 % Soln  Generic drug:  Polyethyl Glycol-Propyl Glycol  Place 2 drops into both eyes 2 (two) times daily as needed (dry eyes).     traZODone 50 MG tablet  Commonly  known as:  DESYREL  Take 50 mg by mouth at bedtime as needed for sleep (take if the 100 mg dose is not effective.).         Diet and Activity recommendation: See Discharge Instructions below   Discharge Instructions     Follow with Primary MD Adella Hare, MD and your Back Surgeon in 2 days   Get CBC, CMP, checked 2 days by Primary MD and again as instructed by your Primary MD. Get a 2 view Chest X ray done next visit .   Activity: As tolerated with Full fall precautions use walker/cane & assistance as needed, wear Back brace as told by your Surgeon   Disposition Home with HHPT   Diet: Heart Healthy , feeding assistance and aspiration precautions if needed.  For Heart failure patients - Check your Weight same time everyday, if you gain over 2 pounds, or you  develop in leg swelling, experience more shortness of breath or chest pain, call your Primary MD immediately. Follow Cardiac Low Salt Diet and 1.8 lit/day fluid restriction.   On your next visit with her primary care physician please Get Medicines reviewed and adjusted.  Please request your Prim.MD to go over all Hospital Tests and Procedure/Radiological results at the follow up, please get all Hospital records sent to your Prim MD by signing hospital release before you go home.   If you experience worsening of your admission symptoms, develop shortness of breath, life threatening emergency, suicidal or homicidal thoughts you must seek medical attention immediately by calling 911 or calling your MD immediately  if symptoms less severe.  You Must read complete instructions/literature along with all the possible adverse reactions/side effects for all the Medicines you take and that have been prescribed to you. Take any new Medicines after you have completely understood and accpet all the possible adverse reactions/side effects.   Do not drive and provide baby sitting services if your were admitted for syncope or siezures until  you have seen by Primary MD or a Neurologist and advised to do so again.  Do not drive when taking Pain medications.    Do not take more than prescribed Pain, Sleep and Anxiety Medications  Special Instructions: If you have smoked or chewed Tobacco  in the last 2 yrs please stop smoking, stop any regular Alcohol  and or any Recreational drug use.  Wear Seat belts while driving.   Please note  You were cared for by a hospitalist during your hospital stay. If you have any questions about your discharge medications or the care you received while you were in the hospital after you are discharged, you can call the unit and asked to speak with the hospitalist on call if the hospitalist that took care of you is not available. Once you are discharged, your primary care physician will handle any further medical issues. Please note that NO REFILLS for any discharge medications will be authorized once you are discharged, as it is imperative that you return to your primary care physician (or establish a relationship with a primary care physician if you do not have one) for your aftercare needs so that they can reassess your need for medications and monitor your lab values.    Major procedures and Radiology Reports - PLEASE review detailed and final reports for all details, in brief -       Dg Chest 2 View  05/08/2013   CLINICAL DATA:  Cough, sudden onset congestion  EXAM: CHEST  2 VIEW  COMPARISON:  DG CHEST 1V PORT dated 05/06/2013; DG CHEST 1V PORT dated 03/22/2013  FINDINGS: There is elevation the right diaphragm. There is no focal parenchymal opacity, pleural effusion, or pneumothorax. The heart and mediastinal contours are unremarkable.  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 There are degenerative changes of the right acromioclavicular joint. There is as a chronic injury of the left acromioclavicular joint.  IMPRESSION: No active cardiopulmonary disease.   Electronically Signed   By: Kathreen Devoid   On: 05/08/2013 13:16     Ct Head Wo Contrast  05/06/2013   CLINICAL DATA:  Altered mental status.  Patient fell.  EXAM: CT HEAD WITHOUT CONTRAST  TECHNIQUE: Contiguous axial images were obtained from the base of the skull through the vertex without intravenous contrast.  COMPARISON:  CT scan dated 07/14/2012  FINDINGS: No mass lesion. No midline shift. No acute hemorrhage or hematoma. No  extra-axial fluid collections. No evidence of acute infarction. Brain parenchyma is normal except for mild cerebral cortical atrophy. No osseous abnormality.  IMPRESSION: No acute abnormality.  No change since the prior exam.   Electronically Signed   By: Rozetta Nunnery M.D.   On: 05/06/2013 14:21   Ct Cervical Spine Wo Contrast  05/06/2013   CLINICAL DATA:  Altered mental status after a fall.  EXAM: CT CERVICAL SPINE WITHOUT CONTRAST  TECHNIQUE: Multidetector CT imaging of the cervical spine was performed without intravenous contrast. Multiplanar CT image reconstructions were also generated.  COMPARISON:  None.  FINDINGS: There is no acute fracture.  No prevertebral soft tissue swelling.  The patient has severe degenerative disc disease at C2-3 with a 2 mm retrolisthesis of C2 on C3. There is a 3 mm anterolisthesis of C4 on C5 due to moderate left facet arthritis. This is not felt to be acute.  There is fairly severe degenerative disc disease at C5-6 and C6-7. There is a 2 mm anterolisthesis of C7 on T1 due to severe bilateral facet arthritis.  There is fairly severe right facet arthritis at C2-3 and C3-4.  The study is degraded by motion artifact but I feel that it is diagnostic.  IMPRESSION: No acute abnormality of the cervical spine. Multilevel degenerative disc and joint disease. Identified subluxations are felt to be chronic.   Electronically Signed   By: Rozetta Nunnery M.D.   On: 05/06/2013 14:26     Micro Results      No results found for this or any previous visit (from the past 240 hour(s)).   History  of present illness and  Hospital Course:      Kindly see H&P for history of present illness and admission details, please review complete Labs, Consult reports and Test reports for all details in brief [name], is a 68 y.o. male, patient with history of  RA arthritis on steroids (Humira d/c'd recently due to infection), posterior lumbar fusion (03/19/13) with post-op pseudomonas infection s/p debridement and treatment with 40 days of Cefepime ending Jan 23rd (developed hives towards the end off treatment but completed course). Also, has h/o COPD, hypertension, dyslipidemia who presented to [facility] ED 05/06/2013 with confusion. He was recently discharged on 1/17 after and admission for hypotension and SBO which was treated conservatively.      Per the patient's wife, he has been falling lately - 8 falls in about 1 wk. She has been cutting back on his pain medications and Valium but he was noted to be having difficulty breathing, lethargic, confused and hypotensive with SBP < 100 was sent to the ER. He was placed on BiPAP by EMS which was removed by the ER.     Acute encephalopathy/hypertension/acute respiratory failure were consistent with unintentional polypharmacy, patient was on multiple narcotics, benzodiazepines and sedatives, all the offending medications were held and he received supportive care with good effect, now his mentation is back to baseline, he is am related thrice with a walker and back brace in the hallway without much assistance, he was seen by physical therapy and cleared for home discharge with home PT which will be ordered. His narcotics and benzodiazepines have been cut back considerably and he and his wife have been extensively counseled not to overuse them and to hold them in case he becomes somnolent or drowsy. Of note patient was getting medications from 2 different physicians according to the wife, they have been told to stick only to primary care  physician for all the  medication needs in order to avoid over prescription and over use.    For his recent back lumbar fusion surgery complicated by infection he will continue to follow with his back surgeon Dr. Joya Salm within the next few days.      He had mild hypotension and dehydration causing acute renal failure upon admission which is completely resolved after hydration with IV fluids. We will resume his home dose diuretics and antihypertensives upon discharge.    Dyslipidemia continue home dose statin, history of pseudogout no acute issues continue home dose colchicine.     Some unintentional weight loss, I have encouraged the patient to follow with his PCP for this issue. Protein supplements ordered for protein calorie malnutrition which was mild to moderate.    According to the wife for the last 2 months patient has had soft voice which is not his usual, home speech therapy requested to see the patient, I have told the wife to inform PCP for ENT referral if speech does not improve after outpatient speech therapy.    Rheumatoid arthritis continue home dose prednisone, Humira per PCP if needed.     Today   Subjective:   [name] today has no headache,no chest abdominal pain,no new weakness tingling or numbness, feels much better wants to go home today.    Objective:   Blood pressure 168/93, pulse 93, temperature 97.9 F (36.6 C), temperature source Oral, resp. rate 16, height 5' 7"  (1.702 m), weight 65 kg (143 lb 4.8 oz), SpO2 95.00%.   Intake/Output Summary (Last 24 hours) at 05/09/13 0913 Last data filed at 05/09/13 0600  Gross per 24 hour  Intake    240 ml  Output   2076 ml  Net  -1836 ml    Exam Awake Alert, Oriented *3, No new F.N deficits, Normal affect [place].AT,PERRAL Supple Neck,No JVD, No cervical lymphadenopathy appriciated.  Symmetrical Chest wall movement, Good air movement bilaterally, CTAB RRR,No Gallops,Rubs or new Murmurs, No Parasternal Heave, wearing  back brace +ve B.Sounds, Abd Soft, Non tender, No organomegaly appriciated, No rebound -guarding or rigidity. No Cyanosis, Clubbing or edema, No new Rash or bruise  Data Review   CBC w Diff: Lab Results  Component Value Date   WBC 3.6* 05/07/2013   HGB 9.8* 05/07/2013   HCT 30.0* 05/07/2013   PLT 207 05/07/2013   LYMPHOPCT 25 05/06/2013   MONOPCT 21* 05/06/2013   EOSPCT 5 05/06/2013   BASOPCT 0 05/06/2013    CMP: Lab Results  Component Value Date   NA 137 05/07/2013   K 4.2 05/07/2013   CL 98 05/07/2013   CO2 24 05/07/2013   BUN 18 05/07/2013   CREATININE 0.84 05/07/2013   PROT 6.4 05/06/2013   ALBUMIN 3.1* 05/06/2013   BILITOT 0.6 05/06/2013   ALKPHOS 99 05/06/2013   AST 18 05/06/2013   ALT 9 05/06/2013  .   Total Time in preparing paper work, data evaluation and todays exam - 35 minutes  Thurnell Lose M.D on 05/09/2013 at 9:13 AM  [place]  [phone]

## 2013-05-11 ENCOUNTER — Telehealth: Payer: Self-pay

## 2013-05-11 DIAGNOSIS — J189 Pneumonia, unspecified organism: Secondary | ICD-10-CM

## 2013-05-11 DIAGNOSIS — N179 Acute kidney failure, unspecified: Secondary | ICD-10-CM

## 2013-05-11 DIAGNOSIS — J9601 Acute respiratory failure with hypoxia: Secondary | ICD-10-CM

## 2013-05-11 DIAGNOSIS — Y95 Nosocomial condition: Secondary | ICD-10-CM

## 2013-05-11 NOTE — Telephone Encounter (Signed)
Patient states the hospital will be faxing over information for Dr Linda Hedges

## 2013-05-12 NOTE — Telephone Encounter (Signed)
Orders in, come by anytime

## 2013-05-12 NOTE — Telephone Encounter (Signed)
Pt called.  He states the hospital told him to call Dr. Linda Hedges for an order for 2-view chest x-ray and a cbc and cmp lab. Pt's phone is [phone].

## 2013-05-13 NOTE — Telephone Encounter (Signed)
Pt is aware.  

## 2013-05-17 ENCOUNTER — Ambulatory Visit: Payer: Medicare Other | Attending: Internal Medicine | Admitting: Physical Therapy

## 2013-05-17 DIAGNOSIS — R269 Unspecified abnormalities of gait and mobility: Secondary | ICD-10-CM | POA: Insufficient documentation

## 2013-05-17 DIAGNOSIS — R5381 Other malaise: Secondary | ICD-10-CM | POA: Insufficient documentation

## 2013-05-18 ENCOUNTER — Other Ambulatory Visit (INDEPENDENT_AMBULATORY_CARE_PROVIDER_SITE_OTHER): Payer: Medicare Other

## 2013-05-18 ENCOUNTER — Ambulatory Visit (INDEPENDENT_AMBULATORY_CARE_PROVIDER_SITE_OTHER): Payer: Medicare Other | Admitting: Internal Medicine

## 2013-05-18 ENCOUNTER — Encounter: Payer: Self-pay | Admitting: Internal Medicine

## 2013-05-18 VITALS — BP 90/50 | HR 96 | Temp 97.0°F | Wt 130.0 lb

## 2013-05-18 DIAGNOSIS — R509 Fever, unspecified: Secondary | ICD-10-CM

## 2013-05-18 LAB — CBC WITH DIFFERENTIAL/PLATELET
Basophils Absolute: 0 10*3/uL (ref 0.0–0.1)
Basophils Relative: 0.4 % (ref 0.0–3.0)
Eosinophils Absolute: 0 10*3/uL (ref 0.0–0.7)
Eosinophils Relative: 0.4 % (ref 0.0–5.0)
HCT: 40.6 % (ref 39.0–52.0)
Hemoglobin: 13.2 g/dL (ref 13.0–17.0)
Lymphocytes Relative: 21.5 % (ref 12.0–46.0)
Lymphs Abs: 2.5 10*3/uL (ref 0.7–4.0)
MCHC: 32.5 g/dL (ref 30.0–36.0)
MCV: 88.2 fl (ref 78.0–100.0)
Monocytes Absolute: 1.7 10*3/uL — ABNORMAL HIGH (ref 0.1–1.0)
Monocytes Relative: 14.6 % — ABNORMAL HIGH (ref 3.0–12.0)
Neutro Abs: 7.2 10*3/uL (ref 1.4–7.7)
Neutrophils Relative %: 63.1 % (ref 43.0–77.0)
Platelets: 472 10*3/uL — ABNORMAL HIGH (ref 150.0–400.0)
RBC: 4.6 Mil/uL (ref 4.22–5.81)
RDW: 15.7 % — ABNORMAL HIGH (ref 11.5–14.6)
WBC: 11.4 10*3/uL — ABNORMAL HIGH (ref 4.5–10.5)

## 2013-05-18 MED ORDER — HYDROMORPHONE HCL 4 MG PO TABS: 4.0000 mg | ORAL_TABLET | Freq: Three times a day (TID) | ORAL | Status: DC | PRN

## 2013-05-18 MED ORDER — OXYCODONE HCL ER 40 MG PO T12A: 40.0000 mg | EXTENDED_RELEASE_TABLET | Freq: Two times a day (BID) | ORAL | Status: DC | PRN

## 2013-05-18 NOTE — Patient Instructions (Signed)
Better to see you here than in the hospital  1. The fever is a concern: consulted with Dr. Tommy Medal.  Plan Blood cultures x 2 sites  CBC with diff  Call for any recurrent fever  2. Wound care - keep appointment with Dr. Joya Salm - x-ray and office visit. He can help decide if any additional imaging studies are needed.  3. Pain control - will refill Rx today   4. Laryngitis - cause is a mystery. Good plan to go to neuro-rehab. A back up plan if this does not help is to be seen at [facility] speech pathology department.  5. Nutrition - keep working at maintaining protein intake, e.g. Shakes, ensure, etc  6. Arthritis - our hands are tied (no pun intended) at this time and will be until there is no evidence of any infection.   7. Confusion - multi-factorial. We need to be careful about pain meds but also very alert to any signs of continued infection.

## 2013-05-18 NOTE — Progress Notes (Signed)
Subjective:    Patient ID: [name], male    DOB: [birth year], 68 y.o.   MRN: [id number]  HPI  Subjective:   Patient ID: [name], male DOB: [birth year], 68 y.o. MRN: [id number]  HPI  Mr. Wollman presents for follow up related to this saga of back surgery with secondary wound infection with associated complications including weight loss/malnutrition, hyponatremia (resolved), profound laryngitis of unknown etiology after full eval including ENT fiberoptic exam and speech pathology consults. He has progressive weakness and is becoming more dispirited. He also has been struggling with pain control and mental status changes, more likely delerium than pain med related.  Last night he had prolong bout of fever documented at 5 F, with rigors and sweats. He felt miserable. Today he has no fever but still feels debilitated from last nights illness. He does have multiple joint pain due to his RA and being of [place] for many weeks.   Past Medical History   Diagnosis  Date   .  Hx of colonic polyps    .  Diverticulosis    .  Hypertension    .  Osteoarthritis    .  Hemochromatosis      dx'd 14 yrs ago last ferritin Aug 11, 08 52 (22-322), Fe 136   .  CAD (coronary artery disease)      minimal coronary plaque in the LAD and right coronary system. 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 7-06. Last nuclear stress 11-17-06 fixed anterior/ inferior defect, no inducible ischemia, EF 81%   .  Allergic rhinitis    .  Hx of colonoscopy    .  RA (rheumatoid arthritis)    .  COPD (chronic obstructive pulmonary disease)    .  PONV (postoperative nausea and vomiting)    .  Dysrhythmia  01-24-12     past hx. A.Fib x1 episode-responded to med.   .  Depression    .  Anxiety      pt. feels its related to the medicine that he takes   .  GERD (gastroesophageal reflux disease)     Past Surgical History   Procedure  Laterality   Date   .  Appendectomy     .  Inguinal hernia repair   2008     Right, left remotely   .  Total knee arthroplasty       right knee partial 2002   .  Tonsillectomy     .  Shoulder preplacement   2008     left, partial   .  Ptca w/ coated stent lad       Cx secondary to tear   .  Foot surgery   11-08     for removal of bone spurs- right foot   .  Reconstruction of ankle   6-09     Right- [facility]   .  L4-5 diskectomy w/ fusion, cage placement and rods   12-10     [place]   .  Partial knee arthroplasty       left   .  Total shoulder replacement       right   .  Joint replacement     .  Cataract extraction, bilateral   01-24-12     Bilateral   .  Orif shoulder fracture   02/06/2012     Procedure: OPEN REDUCTION INTERNAL FIXATION (ORIF) SHOULDER FRACTURE; Surgeon: Nita Sells, MD;  Location: [facility]; Service: Orthopedics; Laterality: Left; ORIF of a Left Shoulder Fracture with Iliac Crest Bone Graft aspiration   .  Harvest bone graft   02/06/2012     Procedure: HARVEST ILIAC BONE GRAFT; Surgeon: Nita Sells, MD; Location: [facility]; Service: Orthopedics;; bone marrow aspirqation   .  Hardware removal   03/09/2012     Procedure: HARDWARE REMOVAL; Surgeon: Nita Sells, MD; Location: [place]; Service: Orthopedics; Laterality: Left; Hardware Removal from Left Shoulder   .  Cardiac catheterization   2006     coronary stents- no problems   .  Shoulder arthroscopy   2013   .  Carpal tunnel release       R hand   .  Ankle surgery  Right  2008     at [facility], replacement   .  Dg toes*r*       toe surgery- 07/2012   .  Posterior lumbar fusion 4 level  N/A  03/02/2013     Procedure: Lumbar One to Sacral One Posterior lumbar interbody fusion; Surgeon: Floyce Stakes, MD; Location: [place] [facility]; Service: Neurosurgery; Laterality: N/A; L1 to S1 Posterior lumbar interbody fusion   .  Lumbar wound debridement  N/A  03/17/2013     Procedure: Incision and  drainage of superficial lumbar wound; Surgeon: Floyce Stakes, MD; Location: [place] [facility]; Service: Neurosurgery; Laterality: N/A; Incision and drainage of superficial lumbar wound    Family History   Problem  Relation  Age of Onset   .  Uterine cancer  Mother      survivor   .  Macular degeneration  Mother    .  Other  Mother      ankle edema   .  Lung cancer  Mother    .  Cancer  Mother      ovarian, lung   .  Coronary artery disease  Father    .  Hypertension  Father    .  Prostate cancer  Father    .  Colon polyps  Father    .  Cancer  Father      prostate   .  Thyroid disease  Sister    .  Heart attack  Brother    .  Hyperlipidemia  Brother    .  Other  Brother      Schizophrenic   .  Diabetes  Neg Hx    .  Colon cancer  Neg Hx    .  Esophageal cancer  Neg Hx    .  Rectal cancer  Neg Hx    .  Stomach cancer  Neg Hx    .  Coronary artery disease  Maternal Aunt    .  Heart attack  Maternal Aunt    .  Rheum arthritis  Sister    .  Hemochromatosis  Sister     History    Social History   .  Marital Status:  Divorced     Spouse Name:  N/A     Number of Children:  2   .  Years of Education:  16    Occupational History   .  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  Smoking status:  Never Smoker   .  Smokeless tobacco:  Never Used   .  Alcohol Use:  No   .  Drug Use:  No   .  Sexual Activity:  Yes     Partners:  Female    Other Topics  Concern   .  Not on file    Social History Narrative    [employer] [place]. Married 1972-60yr seperated/divorced. Engaged April '11. 1 son '74; step-daughter '70. 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 During '11 discovered an [employer]- $500,000+ loss. Is handling this OK - will keep the business running.    Current Outpatient Prescriptions on File Prior to Visit   Medication   Sig  Dispense  Refill   .  Aclidinium Bromide (TUDORZA PRESSAIR) 400 MCG/ACT AEPB  Inhale 1 puff into the lungs 2 (two) times daily.  1 each  11   .  Aclidinium Bromide 400 MCG/ACT AEPB  Inhale 2 puffs into the lungs at bedtime.     .  ALPRAZolam (XANAX) 0.5 MG tablet  Take 1 tablet (0.5 mg total) by mouth 3 (three) times daily as needed for anxiety or sleep.  90 tablet  5   .  aspirin (ASPIRIN EC) 81 MG EC tablet  Take 81 mg by mouth every morning.     .  celecoxib (CELEBREX) 200 MG capsule  Take 200 mg by mouth 2 (two) times daily.     .  cholecalciferol (VITAMIN D) 1000 UNITS tablet  Take 1 tablet (1,000 Units total) by mouth daily.  100 tablet  3   .  colchicine 0.6 MG tablet  Take 0.6 mg by mouth 2 (two) times daily.     .[name] dextrose 5 % SOLN 50 mL with ceFEPIme 2 G SOLR 2 g  Inject 2 g into the vein every 8 (eight) hours. Till January 24th 2015.  24 g  0   .  docusate sodium (COLACE) 100 MG capsule  Take 1 capsule (100 mg total) by mouth 2 (two) times daily.  60 capsule  0   .  feeding supplement, ENSURE COMPLETE, (ENSURE COMPLETE) LIQD  Take 237 mLs by mouth daily.  237 Bottle  1   .  FLUoxetine (PROZAC) 40 MG capsule  Take 40 mg by mouth 2 (two) times daily.     .  fluticasone (FLONASE) 50 MCG/ACT nasal spray  Place 2 sprays into both nostrils at bedtime.     .  furosemide (LASIX) 20 MG tablet  Take 20 mg by mouth daily.     .[name] gabapentin (NEURONTIN) 300 MG capsule  Take 300-600 mg by mouth 4 (four) times daily. 600 mg every morning, 300 mg at noon, 600 mg at bedtime, and 300 mg at 1 AM     .  hydrochlorothiazide (HYDRODIURIL) 25 MG tablet  Take 25 mg by mouth daily before breakfast.     .  losartan (COZAAR) 50 MG tablet  Take 50 mg by mouth daily.     .  Melatonin ([employer] MELATONIN) 5 MG TABS  Take 5 mg by mouth at bedtime.     .  Nutritional Supplements (ENSURE COMPLETE PO)  Take 0.5 Cans by mouth 2 (two) times daily.     .  pantoprazole (PROTONIX) 40 MG tablet  Take 40 mg by mouth 2 (two)  times daily.     .Vladimir FasterGlycol-Propyl Glycol (SYSTANE ULTRA) 0.4-0.3 % SOLN  Place 2 drops into both eyes 2 (two) times daily as needed (dry eyes).     .[name]  polyethylene glycol (MIRALAX / GLYCOLAX) packet  Take 17 g by mouth daily as needed for moderate constipation.  14 each  1   .  potassium chloride SA (K-DUR,KLOR-CON) 20 MEQ tablet  Take 20 mEq by mouth daily.     .  predniSONE (DELTASONE) 5 MG tablet  Take 5 mg by mouth every evening.     .  simvastatin (ZOCOR) 40 MG tablet  Take 40 mg by mouth daily with lunch.     .  traZODone (DESYREL) 50 MG tablet  Take 50 mg by mouth at bedtime as needed for sleep (take if the 100 mg dose is not effective.).     [name]  ciprofloxacin (CIPRO) 500 MG tablet  Take 1 tablet (500 mg total) by mouth 2 (two) times daily.  60 tablet  0    No current facility-administered medications on file prior to visit.    Review of Systems  Constitutional: Positive for fever, chills, activity change and unexpected weight change.  HEENT: Negative for hearing loss, ear pain, congestion, neck stiffness and postnasal drip. Negative for sore throat or swallowing problems. Negative for dental complaints.  Eyes: Negative for vision loss or change in visual acuity.  Respiratory: Negative for chest tightness and wheezing. Negative for DOE.  Cardiovascular: Negative for chest pain or palpitations.  Gastrointestinal: No change in bowel habit. No bloating or gas. No reflux or indigestion  Genitourinary: Negative for urgency, frequency, flank pain and difficulty urinating.  Musculoskeletal: Positive for myalgias, back pain, arthralgias and gait problem 2/2 weakness.  Neurological: Positive for dizziness, tremors, weakness and headaches.  Hematological: Negative for adenopathy.  Psychiatric/Behavioral: Positive for behavioral problems, e.g. confusion and dysphoric mood.   Objective:   Physical Exam  Filed Vitals:    05/18/13 1425   BP:  90/50   Pulse:  96   Temp:  97 F (36.1  C)    Wt Readings from Last 3 Encounters:   05/18/13  130 lb (58.968 kg)   05/06/13  143 lb 4.8 oz (65 kg)   05/05/13  141 lb (63.957 kg)    Gen'l- emaciated appearing man in distress but not acutely uncomfortable HEENT- [place]/AT, C&S clear Neck - supple Nodes - negative cervical and supraclavicular region Cor 2+ radial, RRR Pulm - no increased WOB, no rales or wheezes Neuro - alert, speech clear, oriented. Ambulates independently with walker.     Review of Systems System review is negative for any constitutional, cardiac, pulmonary, GI or neuro symptoms or complaints other than as described in the HPI.     Objective:   Physical Exam Filed Vitals:   05/18/13 1425  BP: 90/50  Pulse: 96  Temp: 97 F (36.1 C)   Gen'l- chronically ill appearing man in no acute distress.      Assessment & Plan:  1. The fever is a concern: consulted with Dr. Tommy Medal.  Plan Blood cultures x 2 sites  CBC with diff  Call for any recurrent fever  2. Wound care - keep appointment with Dr. Joya Salm - x-ray and office visit. He can help decide if any additional imaging studies are needed.  3. Pain control - will refill Rx today   4. Laryngitis - cause is a mystery. Good plan to go to neuro-rehab. A back up plan if this does not help is to be seen at [facility] speech pathology department.  5. Nutrition - keep working at maintaining protein intake, e.g. Shakes, ensure, etc  6. Arthritis - our  hands are tied (no pun intended) at this time and will be until there is no evidence of any infection.   7. Confusion - multi-factorial. We need to be careful about pain meds but also very alert to any signs of continued infection.

## 2013-05-18 NOTE — Progress Notes (Signed)
Pre visit review using our clinic review tool, if applicable. No additional management support is needed unless otherwise documented below in the visit note. 

## 2013-05-21 ENCOUNTER — Ambulatory Visit: Payer: Medicare Other

## 2013-05-21 ENCOUNTER — Encounter (HOSPITAL_BASED_OUTPATIENT_CLINIC_OR_DEPARTMENT_OTHER): Payer: Medicare Other | Attending: General Surgery

## 2013-05-23 ENCOUNTER — Inpatient Hospital Stay (HOSPITAL_COMMUNITY): Payer: Medicare Other

## 2013-05-23 ENCOUNTER — Encounter (HOSPITAL_COMMUNITY): Payer: Self-pay | Admitting: Emergency Medicine

## 2013-05-23 ENCOUNTER — Inpatient Hospital Stay (HOSPITAL_COMMUNITY)
Admission: EM | Admit: 2013-05-23 | Discharge: 2013-05-27 | DRG: 640 | Disposition: A | Discharge status: Rehabilitation Facility | Payer: Medicare Other | Attending: Internal Medicine | Admitting: Internal Medicine

## 2013-05-23 DIAGNOSIS — M069 Rheumatoid arthritis, unspecified: Secondary | ICD-10-CM | POA: Diagnosis present

## 2013-05-23 DIAGNOSIS — Z9181 History of falling: Secondary | ICD-10-CM

## 2013-05-23 DIAGNOSIS — R509 Fever, unspecified: Secondary | ICD-10-CM | POA: Diagnosis present

## 2013-05-23 DIAGNOSIS — R634 Abnormal weight loss: Secondary | ICD-10-CM | POA: Diagnosis present

## 2013-05-23 DIAGNOSIS — Z8249 Family history of ischemic heart disease and other diseases of the circulatory system: Secondary | ICD-10-CM

## 2013-05-23 DIAGNOSIS — T502X5A Adverse effect of carbonic-anhydrase inhibitors, benzothiadiazides and other diuretics, initial encounter: Secondary | ICD-10-CM | POA: Diagnosis present

## 2013-05-23 DIAGNOSIS — J440 Chronic obstructive pulmonary disease with acute lower respiratory infection: Secondary | ICD-10-CM | POA: Diagnosis not present

## 2013-05-23 DIAGNOSIS — I498 Other specified cardiac arrhythmias: Secondary | ICD-10-CM | POA: Diagnosis present

## 2013-05-23 DIAGNOSIS — E871 Hypo-osmolality and hyponatremia: Principal | ICD-10-CM | POA: Diagnosis present

## 2013-05-23 DIAGNOSIS — I4891 Unspecified atrial fibrillation: Secondary | ICD-10-CM

## 2013-05-23 DIAGNOSIS — F411 Generalized anxiety disorder: Secondary | ICD-10-CM | POA: Diagnosis present

## 2013-05-23 DIAGNOSIS — G929 Unspecified toxic encephalopathy: Secondary | ICD-10-CM | POA: Diagnosis present

## 2013-05-23 DIAGNOSIS — G92 Toxic encephalopathy: Secondary | ICD-10-CM | POA: Diagnosis present

## 2013-05-23 DIAGNOSIS — F1190 Opioid use, unspecified, uncomplicated: Secondary | ICD-10-CM

## 2013-05-23 DIAGNOSIS — M961 Postlaminectomy syndrome, not elsewhere classified: Secondary | ICD-10-CM | POA: Diagnosis present

## 2013-05-23 DIAGNOSIS — F329 Major depressive disorder, single episode, unspecified: Secondary | ICD-10-CM | POA: Diagnosis present

## 2013-05-23 DIAGNOSIS — R5381 Other malaise: Secondary | ICD-10-CM | POA: Diagnosis present

## 2013-05-23 DIAGNOSIS — J9601 Acute respiratory failure with hypoxia: Secondary | ICD-10-CM

## 2013-05-23 DIAGNOSIS — R296 Repeated falls: Secondary | ICD-10-CM

## 2013-05-23 DIAGNOSIS — E43 Unspecified severe protein-calorie malnutrition: Secondary | ICD-10-CM | POA: Diagnosis present

## 2013-05-23 DIAGNOSIS — T8149XA Infection following a procedure, other surgical site, initial encounter: Secondary | ICD-10-CM | POA: Diagnosis present

## 2013-05-23 DIAGNOSIS — E860 Dehydration: Secondary | ICD-10-CM | POA: Diagnosis present

## 2013-05-23 DIAGNOSIS — J449 Chronic obstructive pulmonary disease, unspecified: Secondary | ICD-10-CM | POA: Diagnosis present

## 2013-05-23 DIAGNOSIS — M479 Spondylosis, unspecified: Secondary | ICD-10-CM | POA: Diagnosis present

## 2013-05-23 DIAGNOSIS — Z7982 Long term (current) use of aspirin: Secondary | ICD-10-CM

## 2013-05-23 DIAGNOSIS — I2510 Atherosclerotic heart disease of native coronary artery without angina pectoris: Secondary | ICD-10-CM | POA: Diagnosis present

## 2013-05-23 DIAGNOSIS — K219 Gastro-esophageal reflux disease without esophagitis: Secondary | ICD-10-CM | POA: Diagnosis present

## 2013-05-23 DIAGNOSIS — IMO0002 Reserved for concepts with insufficient information to code with codable children: Secondary | ICD-10-CM

## 2013-05-23 DIAGNOSIS — E46 Unspecified protein-calorie malnutrition: Secondary | ICD-10-CM

## 2013-05-23 DIAGNOSIS — Z801 Family history of malignant neoplasm of trachea, bronchus and lung: Secondary | ICD-10-CM

## 2013-05-23 DIAGNOSIS — J209 Acute bronchitis, unspecified: Secondary | ICD-10-CM | POA: Diagnosis not present

## 2013-05-23 DIAGNOSIS — R6510 Systemic inflammatory response syndrome (SIRS) of non-infectious origin without acute organ dysfunction: Secondary | ICD-10-CM | POA: Diagnosis present

## 2013-05-23 DIAGNOSIS — D649 Anemia, unspecified: Secondary | ICD-10-CM | POA: Diagnosis present

## 2013-05-23 DIAGNOSIS — M1990 Unspecified osteoarthritis, unspecified site: Secondary | ICD-10-CM | POA: Diagnosis present

## 2013-05-23 DIAGNOSIS — Z79899 Other long term (current) drug therapy: Secondary | ICD-10-CM

## 2013-05-23 DIAGNOSIS — Z7401 Bed confinement status: Secondary | ICD-10-CM

## 2013-05-23 DIAGNOSIS — Z880 Allergy status to penicillin: Secondary | ICD-10-CM

## 2013-05-23 DIAGNOSIS — Z96659 Presence of unspecified artificial knee joint: Secondary | ICD-10-CM

## 2013-05-23 DIAGNOSIS — Z8042 Family history of malignant neoplasm of prostate: Secondary | ICD-10-CM

## 2013-05-23 DIAGNOSIS — G9340 Encephalopathy, unspecified: Secondary | ICD-10-CM | POA: Diagnosis present

## 2013-05-23 DIAGNOSIS — Z885 Allergy status to narcotic agent status: Secondary | ICD-10-CM

## 2013-05-23 DIAGNOSIS — E785 Hyperlipidemia, unspecified: Secondary | ICD-10-CM | POA: Diagnosis present

## 2013-05-23 DIAGNOSIS — F3289 Other specified depressive episodes: Secondary | ICD-10-CM | POA: Diagnosis present

## 2013-05-23 DIAGNOSIS — G894 Chronic pain syndrome: Secondary | ICD-10-CM | POA: Diagnosis present

## 2013-05-23 DIAGNOSIS — N179 Acute kidney failure, unspecified: Secondary | ICD-10-CM

## 2013-05-23 DIAGNOSIS — I10 Essential (primary) hypertension: Secondary | ICD-10-CM | POA: Diagnosis present

## 2013-05-23 DIAGNOSIS — R627 Adult failure to thrive: Secondary | ICD-10-CM | POA: Diagnosis present

## 2013-05-23 HISTORY — DX: Dorsalgia, unspecified: M54.9

## 2013-05-23 HISTORY — DX: Other chronic pain: G89.29

## 2013-05-23 LAB — CBC WITH DIFFERENTIAL/PLATELET
Basophils Absolute: 0 10*3/uL (ref 0.0–0.1)
Basophils Relative: 0 % (ref 0–1)
Eosinophils Absolute: 0 10*3/uL (ref 0.0–0.7)
Eosinophils Relative: 0 % (ref 0–5)
HCT: 38 % — ABNORMAL LOW (ref 39.0–52.0)
Hemoglobin: 13.3 g/dL (ref 13.0–17.0)
Lymphocytes Relative: 14 % (ref 12–46)
Lymphs Abs: 1.5 10*3/uL (ref 0.7–4.0)
MCH: 28.6 pg (ref 26.0–34.0)
MCHC: 35 g/dL (ref 30.0–36.0)
MCV: 81.7 fL (ref 78.0–100.0)
Monocytes Absolute: 1.6 10*3/uL — ABNORMAL HIGH (ref 0.1–1.0)
Monocytes Relative: 14 % — ABNORMAL HIGH (ref 3–12)
Neutro Abs: 8.1 10*3/uL — ABNORMAL HIGH (ref 1.7–7.7)
Neutrophils Relative %: 73 % (ref 43–77)
Platelets: 365 10*3/uL (ref 150–400)
RBC: 4.65 MIL/uL (ref 4.22–5.81)
RDW: 14.4 % (ref 11.5–15.5)
WBC: 11.2 10*3/uL — ABNORMAL HIGH (ref 4.0–10.5)

## 2013-05-23 LAB — LACTIC ACID, PLASMA: Lactic Acid, Venous: 0.7 mmol/L (ref 0.5–2.2)

## 2013-05-23 LAB — COMPREHENSIVE METABOLIC PANEL
ALT: 13 U/L (ref 0–53)
AST: 28 U/L (ref 0–37)
Albumin: 3.2 g/dL — ABNORMAL LOW (ref 3.5–5.2)
Alkaline Phosphatase: 85 U/L (ref 39–117)
BUN: 13 mg/dL (ref 6–23)
CO2: 20 mEq/L (ref 19–32)
Calcium: 8.8 mg/dL (ref 8.4–10.5)
Chloride: 86 mEq/L — ABNORMAL LOW (ref 96–112)
Creatinine, Ser: 0.51 mg/dL (ref 0.50–1.35)
GFR calc Af Amer: >90 mL/min (ref >90)
GFR calc non Af Amer: >90 mL/min (ref >90)
Glucose, Bld: 94 mg/dL (ref 70–99)
Potassium: 3.6 mEq/L — ABNORMAL LOW (ref 3.7–5.3)
Sodium: 126 mEq/L — ABNORMAL LOW (ref 137–147)
Total Bilirubin: 1 mg/dL (ref 0.3–1.2)
Total Protein: 7.2 g/dL (ref 6.0–8.3)

## 2013-05-23 LAB — URINALYSIS, ROUTINE W REFLEX MICROSCOPIC
Glucose, UA: NEGATIVE mg/dL
Hgb urine dipstick: NEGATIVE
Ketones, ur: >80 mg/dL — AB
Leukocytes, UA: NEGATIVE
Nitrite: NEGATIVE
Protein, ur: NEGATIVE mg/dL
Specific Gravity, Urine: 1.021 (ref 1.005–1.030)
Urobilinogen, UA: 1 mg/dL (ref 0.0–1.0)
pH: 6 (ref 5.0–8.0)

## 2013-05-23 LAB — LIPASE, BLOOD: Lipase: 67 U/L — ABNORMAL HIGH (ref 11–59)

## 2013-05-23 LAB — GLUCOSE, CAPILLARY: Glucose-Capillary: 82 mg/dL (ref 70–99)

## 2013-05-23 LAB — PROCALCITONIN: Procalcitonin: <0.1 ng/mL

## 2013-05-23 MED ORDER — DOCUSATE SODIUM 100 MG PO CAPS
100.0000 mg | ORAL_CAPSULE | Freq: Two times a day (BID) | ORAL | Status: DC
  Administered 2013-05-23 – 2013-05-27 (×8): 100 mg via ORAL
  Filled 2013-05-23 (×9): qty 1

## 2013-05-23 MED ORDER — ENOXAPARIN SODIUM 40 MG/0.4ML ~~LOC~~ SOLN
40.0000 mg | SUBCUTANEOUS | Status: DC
  Administered 2013-05-24 – 2013-05-27 (×4): 40 mg via SUBCUTANEOUS
  Filled 2013-05-23 (×4): qty 0.4

## 2013-05-23 MED ORDER — GABAPENTIN 300 MG PO CAPS
300.0000 mg | ORAL_CAPSULE | ORAL | Status: DC
  Administered 2013-05-24 – 2013-05-26 (×5): 300 mg via ORAL
  Filled 2013-05-23 (×7): qty 1

## 2013-05-23 MED ORDER — SODIUM CHLORIDE 0.9 % IJ SOLN
3.0000 mL | Freq: Two times a day (BID) | INTRAMUSCULAR | Status: DC
  Administered 2013-05-24 – 2013-05-27 (×6): 3 mL via INTRAVENOUS

## 2013-05-23 MED ORDER — ENSURE COMPLETE PO LIQD
237.0000 mL | Freq: Three times a day (TID) | ORAL | Status: DC
  Administered 2013-05-24 – 2013-05-27 (×8): 237 mL via ORAL
  Filled 2013-05-23 (×3): qty 237

## 2013-05-23 MED ORDER — ALPRAZOLAM 0.5 MG PO TABS: 0.5000 mg | ORAL_TABLET | Freq: Three times a day (TID) | ORAL | Status: DC | PRN

## 2013-05-23 MED ORDER — SIMVASTATIN 40 MG PO TABS
40.0000 mg | ORAL_TABLET | Freq: Every day | ORAL | Status: DC
  Administered 2013-05-24 – 2013-05-27 (×4): 40 mg via ORAL
  Filled 2013-05-23 (×4): qty 1

## 2013-05-23 MED ORDER — POLYVINYL ALCOHOL 1.4 % OP SOLN: 2.0000 [drp] | Freq: Two times a day (BID) | OPHTHALMIC | Status: DC | PRN

## 2013-05-23 MED ORDER — OXYCODONE HCL ER 10 MG PO T12A
10.0000 mg | EXTENDED_RELEASE_TABLET | Freq: Two times a day (BID) | ORAL | Status: DC
  Administered 2013-05-23 – 2013-05-27 (×8): 10 mg via ORAL
  Filled 2013-05-23 (×8): qty 1

## 2013-05-23 MED ORDER — ALUM & MAG HYDROXIDE-SIMETH 200-200-20 MG/5ML PO SUSP: 30.0000 mL | Freq: Four times a day (QID) | ORAL | Status: DC | PRN

## 2013-05-23 MED ORDER — GUAIFENESIN-DM 100-10 MG/5ML PO SYRP
5.0000 mL | ORAL_SOLUTION | ORAL | Status: DC | PRN
  Filled 2013-05-23: qty 5

## 2013-05-23 MED ORDER — PANTOPRAZOLE SODIUM 40 MG PO TBEC
40.0000 mg | DELAYED_RELEASE_TABLET | Freq: Two times a day (BID) | ORAL | Status: DC
  Administered 2013-05-23 – 2013-05-27 (×8): 40 mg via ORAL
  Filled 2013-05-23 (×8): qty 1

## 2013-05-23 MED ORDER — SODIUM CHLORIDE 0.9 % IV BOLUS (SEPSIS)
1000.0000 mL | Freq: Once | INTRAVENOUS | Status: AC
  Administered 2013-05-23: 1000 mL via INTRAVENOUS

## 2013-05-23 MED ORDER — HYDROMORPHONE HCL 2 MG PO TABS
4.0000 mg | ORAL_TABLET | Freq: Three times a day (TID) | ORAL | Status: DC | PRN
  Administered 2013-05-25 – 2013-05-27 (×4): 4 mg via ORAL
  Filled 2013-05-23 (×4): qty 2

## 2013-05-23 MED ORDER — VANCOMYCIN HCL 10 G IV SOLR
20.0000 mg/kg | Freq: Once | INTRAVENOUS | Status: DC
Start: 2013-05-23 — End: 2013-05-23
  Filled 2013-05-23: qty 1180

## 2013-05-23 MED ORDER — VANCOMYCIN HCL IN DEXTROSE 750-5 MG/150ML-% IV SOLN
750.0000 mg | Freq: Two times a day (BID) | INTRAVENOUS | Status: DC
  Administered 2013-05-24 – 2013-05-26 (×5): 750 mg via INTRAVENOUS
  Filled 2013-05-23 (×6): qty 150

## 2013-05-23 MED ORDER — ALBUTEROL SULFATE (2.5 MG/3ML) 0.083% IN NEBU: 2.5000 mg | INHALATION_SOLUTION | RESPIRATORY_TRACT | Status: DC | PRN

## 2013-05-23 MED ORDER — COLCHICINE 0.6 MG PO TABS
0.6000 mg | ORAL_TABLET | Freq: Two times a day (BID) | ORAL | Status: DC
  Administered 2013-05-23 – 2013-05-27 (×8): 0.6 mg via ORAL
  Filled 2013-05-23 (×9): qty 1

## 2013-05-23 MED ORDER — FLUTICASONE PROPIONATE 50 MCG/ACT NA SUSP
2.0000 | Freq: Every day | NASAL | Status: DC
  Administered 2013-05-23 – 2013-05-26 (×4): 2 via NASAL
  Filled 2013-05-23: qty 16

## 2013-05-23 MED ORDER — ONDANSETRON HCL 4 MG PO TABS: 4.0000 mg | ORAL_TABLET | Freq: Four times a day (QID) | ORAL | Status: DC | PRN

## 2013-05-23 MED ORDER — FLUOXETINE HCL 20 MG PO CAPS
40.0000 mg | ORAL_CAPSULE | Freq: Two times a day (BID) | ORAL | Status: DC
  Administered 2013-05-23 – 2013-05-27 (×8): 40 mg via ORAL
  Filled 2013-05-23 (×9): qty 2

## 2013-05-23 MED ORDER — GABAPENTIN 300 MG PO CAPS
600.0000 mg | ORAL_CAPSULE | ORAL | Status: DC
  Administered 2013-05-23 – 2013-05-26 (×7): 600 mg via ORAL
  Filled 2013-05-23 (×11): qty 2

## 2013-05-23 MED ORDER — VITAMIN D3 25 MCG (1000 UNIT) PO TABS
1000.0000 [IU] | ORAL_TABLET | Freq: Every day | ORAL | Status: DC
  Administered 2013-05-24 – 2013-05-27 (×4): 1000 [IU] via ORAL
  Filled 2013-05-23 (×4): qty 1

## 2013-05-23 MED ORDER — SODIUM CHLORIDE 0.9 % IV SOLN
250.0000 mg | Freq: Four times a day (QID) | INTRAVENOUS | Status: DC
  Administered 2013-05-24 – 2013-05-26 (×10): 250 mg via INTRAVENOUS
  Filled 2013-05-23 (×12): qty 250

## 2013-05-23 MED ORDER — ONDANSETRON HCL 4 MG/2ML IJ SOLN: 4.0000 mg | Freq: Four times a day (QID) | INTRAMUSCULAR | Status: DC | PRN

## 2013-05-23 MED ORDER — VANCOMYCIN HCL 10 G IV SOLR
1250.0000 mg | Freq: Once | INTRAVENOUS | Status: AC
  Administered 2013-05-23: 1250 mg via INTRAVENOUS
  Filled 2013-05-23: qty 1250

## 2013-05-23 MED ORDER — ONDANSETRON HCL 4 MG/2ML IJ SOLN
4.0000 mg | Freq: Once | INTRAMUSCULAR | Status: AC
  Administered 2013-05-23: 4 mg via INTRAVENOUS
  Filled 2013-05-23: qty 2

## 2013-05-23 MED ORDER — TRAZODONE HCL 50 MG PO TABS
50.0000 mg | ORAL_TABLET | Freq: Every evening | ORAL | Status: DC | PRN
  Administered 2013-05-25 (×2): 50 mg via ORAL
  Filled 2013-05-23 (×2): qty 1

## 2013-05-23 MED ORDER — LOSARTAN POTASSIUM 50 MG PO TABS
50.0000 mg | ORAL_TABLET | Freq: Every day | ORAL | Status: DC
  Administered 2013-05-24 – 2013-05-27 (×4): 50 mg via ORAL
  Filled 2013-05-23 (×4): qty 1

## 2013-05-23 MED ORDER — SODIUM CHLORIDE 0.9 % IV SOLN
250.0000 mg | Freq: Once | INTRAVENOUS | Status: AC
  Administered 2013-05-23: 250 mg via INTRAVENOUS
  Filled 2013-05-23: qty 250

## 2013-05-23 MED ORDER — PREDNISONE 10 MG PO TABS
10.0000 mg | ORAL_TABLET | Freq: Every evening | ORAL | Status: DC
  Administered 2013-05-23 – 2013-05-25 (×3): 10 mg via ORAL
  Filled 2013-05-23 (×4): qty 1

## 2013-05-23 MED ORDER — ACETAMINOPHEN 650 MG RE SUPP: 650.0000 mg | Freq: Four times a day (QID) | RECTAL | Status: DC | PRN

## 2013-05-23 MED ORDER — OSELTAMIVIR PHOSPHATE 75 MG PO CAPS
75.0000 mg | ORAL_CAPSULE | Freq: Two times a day (BID) | ORAL | Status: DC
  Administered 2013-05-23 – 2013-05-24 (×2): 75 mg via ORAL
  Filled 2013-05-23 (×3): qty 1

## 2013-05-23 MED ORDER — SODIUM CHLORIDE 0.9 % IV SOLN
INTRAVENOUS | Status: DC
  Administered 2013-05-23 – 2013-05-25 (×3): via INTRAVENOUS

## 2013-05-23 MED ORDER — TIOTROPIUM BROMIDE MONOHYDRATE 18 MCG IN CAPS
18.0000 ug | ORAL_CAPSULE | Freq: Every day | RESPIRATORY_TRACT | Status: DC
  Administered 2013-05-24 – 2013-05-27 (×4): 18 ug via RESPIRATORY_TRACT
  Filled 2013-05-23: qty 5

## 2013-05-23 MED ORDER — ACETAMINOPHEN 325 MG PO TABS
650.0000 mg | ORAL_TABLET | Freq: Four times a day (QID) | ORAL | Status: DC | PRN
  Administered 2013-05-24: 650 mg via ORAL
  Filled 2013-05-23: qty 2

## 2013-05-23 MED ORDER — POLYETHYLENE GLYCOL 3350 17 G PO PACK
17.0000 g | PACK | Freq: Every day | ORAL | Status: DC | PRN
  Filled 2013-05-23: qty 1

## 2013-05-23 MED ORDER — ASPIRIN EC 81 MG PO TBEC
81.0000 mg | DELAYED_RELEASE_TABLET | Freq: Every morning | ORAL | Status: DC
  Administered 2013-05-24 – 2013-05-27 (×4): 81 mg via ORAL
  Filled 2013-05-23 (×4): qty 1

## 2013-05-23 NOTE — ED Notes (Signed)
Patient presents to ed son states patient was just recently discharged from hospital . States he has been in and out of the hospital since Nov. After having back surgery and getting an infection.Family states he hasn't been able to walk and is getting weaker. Patient is  Alert however isn't able to speak above a whisper. C/o decreased appetite.

## 2013-05-23 NOTE — Progress Notes (Signed)
ANTIBIOTIC CONSULT NOTE - INITIAL  Pharmacy Consult for Primaxin Indication: Sepsis  Allergies  Allergen Reactions  . Morphine And Related Shortness Of Breath, Nausea And Vomiting, Swelling and Other (See Comments)    Agitation, tolerates dilaudid  . Penicillins Hives, Shortness Of Breath and Rash    REACTION: hives, breathing problems  . Cefixime     rash  . Percocet [Oxycodone-Acetaminophen] Anxiety    Patient Measurements: Height: 5' 7"  (170.2 cm) Weight: 130 lb (58.968 kg) IBW/kg (Calculated) : 66.1 Adjusted Body Weight: n/a  Vital Signs: Temp: 102.5 F (39.2 C) (02/15 1751) Temp src: Rectal (02/15 1751) BP: 168/100 mmHg (02/15 1751) Pulse Rate: 99 (02/15 1751) Intake/Output from previous day:   Intake/Output from this shift:    Labs:  Recent Labs  05/23/13 1435  WBC 11.2*  HGB 13.3  PLT 365  CREATININE 0.51   Estimated Creatinine Clearance: 75.8 ml/min (by C-G formula based on Cr of 0.51). No results found for this basename: VANCOTROUGH, Corlis Leak, VANCORANDOM, GENTTROUGH, GENTPEAK, GENTRANDOM, TOBRATROUGH, TOBRAPEAK, TOBRARND, AMIKACINPEAK, AMIKACINTROU, AMIKACIN,  in the last 72 hours   Microbiology: Recent Results (from the past 720 hour(s))  CULTURE, BLOOD (SINGLE)     Status: None   Collection Time    05/18/13  4:01 PM      Result Value Ref Range Status   Preliminary Report Blood Culture received; No Growth to date;   Preliminary   Preliminary Report Culture will be held for 5 days before issuing   Preliminary   Preliminary Report a Final Negative report.   Preliminary  CULTURE, BLOOD (SINGLE)     Status: None   Collection Time    05/18/13  4:01 PM      Result Value Ref Range Status   Preliminary Report Blood Culture received; No Growth to date;   Preliminary   Preliminary Report Culture will be held for 5 days before issuing   Preliminary   Preliminary Report a Final Negative report.   Preliminary    Medical History: Past Medical History   Diagnosis Date  . Hx of colonic polyps   . Diverticulosis   . Hypertension   . Osteoarthritis   . 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 CAD (coronary artery disease)     minimal coronary plaque in the LAD and right coronary system. 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 7-06. Last nuclear stress 11-17-06 fixed anterior/ inferior defect, no inducible ischemia, EF 81%  . Allergic rhinitis   . Hx of colonoscopy   . RA (rheumatoid arthritis)   . COPD (chronic obstructive pulmonary disease)   . PONV (postoperative nausea and vomiting)   . Dysrhythmia 01-24-12    past hx. A.Fib x1 episode-responded to med.  . Depression   . Anxiety     pt. feels its related to the medicine that he takes   . GERD (gastroesophageal reflux disease)     Medications:   (Not in a hospital admission) Assessment: 27 YOM who underwent posterior lumbar fusion (03/19/13) with post op pseudomonas infection and treatment with 40 days of Cefepime ending Jan 23rd. Presented to [facility] ED with confusion. He had low O2 sats on arrival. Pharmacy to start Primaxin for sepsis. H&P shows no history of seizures. WBC elevated at 11.2. Max temp 102.5 F. CrCl ~ 76 mL/min.   2/15 Blood Cx x2>> 2/15 Urine Cx >>  Doses in ED 1) Vancomycin 1250 mg IV x 1 dose  Goal of Therapy:  Eradication of infection   Plan:  1) Start Imipenem 250 mg IV Q 6 hours  2) Monitor CBC, renal fx, cultures and patient's clinical progress    Albertina Parr, PharmD.  Clinical Pharmacist Pager [phone]

## 2013-05-23 NOTE — Progress Notes (Signed)
ANTIBIOTIC CONSULT NOTE - INITIAL  Pharmacy Consult for vancomycin Indication: rule out sepsis  Allergies  Allergen Reactions  . Morphine And Related Shortness Of Breath, Nausea And Vomiting, Swelling and Other (See Comments)    Agitation, tolerates dilaudid  . Penicillins Hives, Shortness Of Breath and Rash    REACTION: hives, breathing problems  . Cefixime     rash  . Percocet [Oxycodone-Acetaminophen] Anxiety    Takes OxyContin at home.    Patient Measurements: Height: 5' 7"  (170.2 cm) Weight: 130 lb (58.968 kg) IBW/kg (Calculated) : 66.1  Vital Signs: Temp: 101.8 F (38.8 C) (02/15 2143) Temp src: Rectal (02/15 2143) BP: 171/100 mmHg (02/15 2130) Pulse Rate: 85 (02/15 2130)  Labs:  Recent Labs  05/23/13 1435  WBC 11.2*  HGB 13.3  PLT 365  CREATININE 0.51   Estimated Creatinine Clearance: 75.8 ml/min (by C-G formula based on Cr of 0.51).   Microbiology: Recent Results (from the past 720 hour(s))  CULTURE, BLOOD (SINGLE)     Status: None   Collection Time    05/18/13  4:01 PM      Result Value Ref Range Status   Preliminary Report Blood Culture received; No Growth to date;   Preliminary   Preliminary Report Culture will be held for 5 days before issuing   Preliminary   Preliminary Report a Final Negative report.   Preliminary  CULTURE, BLOOD (SINGLE)     Status: None   Collection Time    05/18/13  4:01 PM      Result Value Ref Range Status   Preliminary Report Blood Culture received; No Growth to date;   Preliminary   Preliminary Report Culture will be held for 5 days before issuing   Preliminary   Preliminary Report a Final Negative report.   Preliminary    Medical History: Past Medical History  Diagnosis Date  . Hx of colonic polyps   . Diverticulosis   . Hypertension   . Osteoarthritis   . 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 CAD (coronary artery disease)     minimal coronary plaque in the LAD and  right coronary system. 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 7-06. Last nuclear stress 11-17-06 fixed anterior/ inferior defect, no inducible ischemia, EF 81%  . Allergic rhinitis   . Hx of colonoscopy   . RA (rheumatoid arthritis)   . COPD (chronic obstructive pulmonary disease)   . PONV (postoperative nausea and vomiting)   . Dysrhythmia 01-24-12    past hx. A.Fib x1 episode-responded to med.  . Depression   . Anxiety     pt. feels its related to the medicine that he takes   . GERD (gastroesophageal reflux disease)     Medications:  Prescriptions prior to admission  Medication Sig Dispense Refill  . Aclidinium Bromide (TUDORZA PRESSAIR) 400 MCG/ACT AEPB Inhale 1 puff into the lungs 2 (two) times daily.  1 each  11  . Aclidinium Bromide 400 MCG/ACT AEPB Inhale 2 puffs into the lungs at bedtime.      . ALPRAZolam (XANAX) 0.5 MG tablet Take 1 tablet (0.5 mg total) by mouth 3 (three) times daily as needed for anxiety or sleep.  90 tablet  5  . aspirin (ASPIRIN EC) 81 MG EC tablet Take 81 mg by mouth every morning.       . celecoxib (CELEBREX) 200 MG  capsule Take 200 mg by mouth 2 (two) times daily.      . cholecalciferol (VITAMIN D) 1000 UNITS tablet Take 1 tablet (1,000 Units total) by mouth daily.  100 tablet  3  . colchicine 0.6 MG tablet Take 0.6 mg by mouth 2 (two) times daily.      [name] docusate sodium (COLACE) 100 MG capsule Take 1 capsule (100 mg total) by mouth 2 (two) times daily.  60 capsule  0  . feeding supplement, ENSURE COMPLETE, (ENSURE COMPLETE) LIQD Take 237 mLs by mouth daily.  237 Bottle  1  . FLUoxetine (PROZAC) 40 MG capsule Take 40 mg by mouth 2 (two) times daily.      . fluticasone (FLONASE) 50 MCG/ACT nasal spray Place 2 sprays into both nostrils at bedtime.       . furosemide (LASIX) 20 MG tablet Take 20 mg by mouth daily.      [name] gabapentin (NEURONTIN) 300 MG capsule Take 300-600 mg by mouth 4 (four)  times daily. 600 mg every morning, 300 mg at noon, 600 mg at bedtime, and 300 mg at 1 AM      . hydrochlorothiazide (HYDRODIURIL) 25 MG tablet Take 25 mg by mouth daily before breakfast.       . HYDROmorphone (DILAUDID) 4 MG tablet Take 1 tablet (4 mg total) by mouth every 8 (eight) hours as needed for moderate pain or severe pain. MAY FILL ON OR AFTER 07/16/13  90 tablet  0  . losartan (COZAAR) 50 MG tablet Take 50 mg by mouth daily.      . Melatonin ([employer]S MELATONIN) 5 MG TABS Take 5 mg by mouth at bedtime.       . Nutritional Supplements (ENSURE COMPLETE PO) Take 0.5 Cans by mouth 2 (two) times daily.      . OxyCODONE (OXYCONTIN) 10 mg T12A 12 hr tablet Take 10 mg by mouth every 12 (twelve) hours.      . OxyCODONE (OXYCONTIN) 20 mg T12A 12 hr tablet Take 20 mg by mouth every 12 (twelve) hours.      . OxyCODONE (OXYCONTIN) 40 mg T12A 12 hr tablet Take 1 tablet (40 mg total) by mouth 2 (two) times daily as needed (pain). MAY FILL ON OR AFTER 07/16/2013  60 tablet  0  . pantoprazole (PROTONIX) 40 MG tablet Take 40 mg by mouth 2 (two) times daily.       Vladimir Faster Glycol-Propyl Glycol (SYSTANE ULTRA) 0.4-0.3 % SOLN Place 2 drops into both eyes 2 (two) times daily as needed (dry eyes).       . polyethylene glycol (MIRALAX / GLYCOLAX) packet Take 17 g by mouth daily as needed for moderate constipation.  14 each  1  . potassium chloride SA (K-DUR,KLOR-CON) 20 MEQ tablet Take 20 mEq by mouth daily.      . predniSONE (DELTASONE) 5 MG tablet Take 5 mg by mouth every evening.       . simvastatin (ZOCOR) 40 MG tablet Take 40 mg by mouth daily with lunch.       . traZODone (DESYREL) 50 MG tablet Take 50 mg by mouth at bedtime as needed for sleep (take if the 100 mg dose is not effective.).       Scheduled:  . [START ON 05/24/2013] aspirin EC  81 mg Oral q morning - 10a  . [START ON 05/24/2013] cholecalciferol  1,000 Units Oral Daily  . colchicine  0.6 mg Oral BID  . docusate sodium  100  mg Oral BID  . [START  ON 05/24/2013] enoxaparin (LOVENOX) injection  40 mg Subcutaneous Q24H  . [START ON 05/24/2013] feeding supplement (ENSURE COMPLETE)  237 mL Oral TID WC  . FLUoxetine  40 mg Oral BID  . fluticasone  2 spray Each Nare QHS  . [START ON 05/24/2013] gabapentin  300 mg Oral 2 times per day  . gabapentin  600 mg Oral 2 times per day  . [START ON 05/24/2013] imipenem-cilastatin  250 mg Intravenous Q6H  . [START ON 05/24/2013] losartan  50 mg Oral Daily  . oseltamivir  75 mg Oral BID  . OxyCODONE  10 mg Oral Q12H  . pantoprazole  40 mg Oral BID  . predniSONE  10 mg Oral QPM  . [START ON 05/24/2013] simvastatin  40 mg Oral Q lunch  . sodium chloride  3 mL Intravenous Q12H  . [START ON 05/24/2013] tiotropium  18 mcg Inhalation Daily   Infusions:  . sodium chloride      Assessment: 68yo male presents from home d/t wife unable to care for him, son willing to take pt in though pt c/o nausea and fatigue so son concerned for new illness after several recent admissions, found w/ AMS and incontinence w/ signs of SIRS thought to be d/t multiple sources of infection, to begin IV ABX empirically for possible sepsis.  Goal of Therapy:  Vancomycin trough level 15-20 mcg/ml  Plan:  Rec'd vanc 124m IV in ED; will continue with vancomycin 7579mIV Q12H and monitor CBC, Cx, levels prn.  VeWynona NeatPharmD, BCPS  05/23/2013,11:15 PM

## 2013-05-23 NOTE — ED Notes (Signed)
Report to [facility] on 3W.  Pt to go to floor with tele monitor, EDT accompanying.

## 2013-05-23 NOTE — ED Provider Notes (Signed)
CSN: [id number]     Arrival date & time 05/23/13  1352 History   First MD Initiated Contact with Patient 05/23/13 1514     Chief Complaint  Patient presents with  . Nausea   HPI Comments: 68 yo M PMHx of RA arthritis on steroids (Humira d/c'd recently due to infection), posterior lumbar fusion (03/19/13) with post-op pseudomonas infection s/p debridement and treatment with 40 days of Cefepime ending Jan 23rd (developed hives towards the end off treatment but completed course), COPD, hypertension, dyslipidemia, recent SBO who presents with CC of nausea, fatigue.  Pt is accompanied by family members.  Pt states since last d/c from hospital, he has felt continuously bad.  He c/o chronic fatigue, intermittent fevers, nausea, waxing and waning diffuse abdominal pain.  Pt has been so fatigued that he has been able to ambulate with walker today, which he has been able to do in the last few weeks. Denies CP, SOB.  He states has had normal bowel function, denies bowel/bladder incontinence, or worsening back pain.  Pt lives with girlfriend, and family state she is unwilling to take care of patient, and called them today stating "he's dying" and that they should bring him to ED for further evaluation.    The history is provided by the patient. No language interpreter was used.    Past Medical History  Diagnosis Date  . Hx of colonic polyps   . Diverticulosis   . Hypertension   . Osteoarthritis   . 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 CAD (coronary artery disease)     minimal coronary plaque in the LAD and right coronary system. 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 7-06. Last nuclear stress 11-17-06 fixed anterior/ inferior defect, no inducible ischemia, EF 81%  . Allergic rhinitis   . Hx of colonoscopy   . RA (rheumatoid arthritis)   . COPD (chronic obstructive pulmonary disease)   . PONV  (postoperative nausea and vomiting)   . Dysrhythmia 01-24-12    past hx. A.Fib x1 episode-responded to med.  . Depression   . Anxiety     pt. feels its related to the medicine that he takes   . GERD (gastroesophageal reflux disease)    Past Surgical History  Procedure Laterality Date  . Appendectomy    . Inguinal hernia repair  2008    Right, left remotely  . Total knee arthroplasty      right knee partial 2002  . Tonsillectomy    . Shoulder preplacement  2008    left, partial  . Ptca w/ coated stent lad      Cx secondary to tear  . Foot surgery  11-08    for removal of bone spurs- right foot  . Reconstruction of ankle  6-09    Right- [facility]  . L4-5 diskectomy w/ fusion, cage placement and rods  12-10    [place]  . Partial knee arthroplasty      left  . Total shoulder replacement      right  . Joint replacement    . Cataract extraction, bilateral  01-24-12    Bilateral  . Orif shoulder fracture  02/06/2012    Procedure: OPEN REDUCTION INTERNAL FIXATION (ORIF) SHOULDER FRACTURE;  Surgeon: Nita Sells, MD;  Location: [facility];  Service: Orthopedics;  Laterality: Left;  ORIF of a Left Shoulder Fracture with  Iliac Crest Bone Graft aspiration   . Harvest bone graft  02/06/2012    Procedure: HARVEST ILIAC BONE GRAFT;  Surgeon: Nita Sells, MD;  Location: [facility];  Service: Orthopedics;;  bone marrow aspirqation   . Hardware removal  03/09/2012    Procedure: HARDWARE REMOVAL;  Surgeon: Nita Sells, MD;  Location: [place];  Service: Orthopedics;  Laterality: Left;  Hardware Removal from Left Shoulder  . Cardiac catheterization  2006    coronary stents- no problems  . Shoulder arthroscopy  2013  . Carpal tunnel release      R hand  . Ankle surgery Right 2008    at [facility], replacement   . Dg toes*r*      toe surgery- 07/2012  . Posterior lumbar fusion 4 level N/A 03/02/2013    Procedure: Lumbar One to Sacral One Posterior lumbar  interbody fusion;  Surgeon: Floyce Stakes, MD;  Location: [place] [facility];  Service: Neurosurgery;  Laterality: N/A;  L1 to S1 Posterior lumbar interbody fusion  . Lumbar wound debridement N/A 03/17/2013    Procedure: Incision and drainage of superficial lumbar wound;  Surgeon: Floyce Stakes, MD;  Location: [place] [facility];  Service: Neurosurgery;  Laterality: N/A;  Incision and drainage of superficial lumbar wound   Family History  Problem Relation Age of Onset  . Uterine cancer Mother     survivor  . Macular degeneration Mother   . Other Mother     ankle edema  . Lung cancer Mother   . Cancer Mother     ovarian, lung  . Coronary artery disease Father   . Hypertension Father   . Prostate cancer Father   . Colon polyps Father   . Cancer Father     prostate  . Thyroid disease Sister   . Heart attack Brother   . Hyperlipidemia Brother   . Other Brother     Schizophrenic  . Diabetes Neg Hx   . Colon cancer Neg Hx   . Esophageal cancer Neg Hx   . Rectal cancer Neg Hx   . Stomach cancer Neg Hx   . Coronary artery disease Maternal Aunt   . Heart attack Maternal Aunt   . Rheum arthritis Sister   . Hemochromatosis Sister    History  Substance Use Topics  . Smoking status: Never Smoker   . Smokeless tobacco: Never Used  . Alcohol Use: No    Review of Systems  Constitutional: Positive for fever, appetite change and fatigue. Negative for chills.  Respiratory: Negative for cough and shortness of breath.   Cardiovascular: Negative for chest pain, palpitations and leg swelling.  Gastrointestinal: Positive for nausea, vomiting and abdominal pain. Negative for diarrhea.  Musculoskeletal: Negative for myalgias.  Skin: Negative for rash.  Neurological: Positive for weakness and light-headedness. Negative for dizziness, seizures, numbness and headaches.  Hematological: Negative for adenopathy. Does not bruise/bleed easily.  All other systems reviewed and are  negative.      Allergies  Morphine and related; Penicillins; Cefixime; and Percocet  Home Medications   Current Outpatient Rx  Name  Route  Sig  Dispense  Refill  . Aclidinium Bromide (TUDORZA PRESSAIR) 400 MCG/ACT AEPB   Inhalation   Inhale 1 puff into the lungs 2 (two) times daily.   1 each   11   . Aclidinium Bromide 400 MCG/ACT AEPB   Inhalation   Inhale 2 puffs into the lungs at bedtime.         [name]  ALPRAZolam (XANAX) 0.5 MG tablet   Oral   Take 1 tablet (0.5 mg total) by mouth 3 (three) times daily as needed for anxiety or sleep.   90 tablet   5   . aspirin (ASPIRIN EC) 81 MG EC tablet   Oral   Take 81 mg by mouth every morning.          . celecoxib (CELEBREX) 200 MG capsule   Oral   Take 200 mg by mouth 2 (two) times daily.         . cholecalciferol (VITAMIN D) 1000 UNITS tablet   Oral   Take 1 tablet (1,000 Units total) by mouth daily.   100 tablet   3   . ciprofloxacin (CIPRO) 500 MG tablet   Oral   Take 1 tablet (500 mg total) by mouth 2 (two) times daily.   60 tablet   0   . colchicine 0.6 MG tablet   Oral   Take 0.6 mg by mouth 2 (two) times daily.         [name] dextrose 5 % SOLN 50 mL with ceFEPIme 2 G SOLR 2 g   Intravenous   Inject 2 g into the vein every 8 (eight) hours. Till January 24th 2015.   24 g   0   . docusate sodium (COLACE) 100 MG capsule   Oral   Take 1 capsule (100 mg total) by mouth 2 (two) times daily.   60 capsule   0   . feeding supplement, ENSURE COMPLETE, (ENSURE COMPLETE) LIQD   Oral   Take 237 mLs by mouth daily.   237 Bottle   1   . FLUoxetine (PROZAC) 40 MG capsule   Oral   Take 40 mg by mouth 2 (two) times daily.         . fluticasone (FLONASE) 50 MCG/ACT nasal spray   Each Nare   Place 2 sprays into both nostrils at bedtime.          . furosemide (LASIX) 20 MG tablet   Oral   Take 20 mg by mouth daily.         [name] gabapentin (NEURONTIN) 300 MG capsule   Oral   Take 300-600 mg by mouth 4  (four) times daily. 600 mg every morning, 300 mg at noon, 600 mg at bedtime, and 300 mg at 1 AM         . hydrochlorothiazide (HYDRODIURIL) 25 MG tablet   Oral   Take 25 mg by mouth daily before breakfast.          . HYDROmorphone (DILAUDID) 4 MG tablet   Oral   Take 1 tablet (4 mg total) by mouth every 8 (eight) hours as needed for moderate pain or severe pain. MAY FILL ON OR AFTER 07/16/13   90 tablet   0   . losartan (COZAAR) 50 MG tablet   Oral   Take 50 mg by mouth daily.         . Melatonin ([employer] MELATONIN) 5 MG TABS   Oral   Take 5 mg by mouth at bedtime.          . Nutritional Supplements (ENSURE COMPLETE PO)   Oral   Take 0.5 Cans by mouth 2 (two) times daily.         . OxyCODONE (OXYCONTIN) 40 mg T12A 12 hr tablet   Oral   Take 1 tablet (40 mg total) by mouth 2 (two) times daily as needed (pain).  MAY FILL ON OR AFTER 07/16/2013   60 tablet   0   . pantoprazole (PROTONIX) 40 MG tablet   Oral   Take 40 mg by mouth 2 (two) times daily.          Vladimir Faster Glycol-Propyl Glycol (SYSTANE ULTRA) 0.4-0.3 % SOLN   Both Eyes   Place 2 drops into both eyes 2 (two) times daily as needed (dry eyes).          . polyethylene glycol (MIRALAX / GLYCOLAX) packet   Oral   Take 17 g by mouth daily as needed for moderate constipation.   14 each   1   . potassium chloride SA (K-DUR,KLOR-CON) 20 MEQ tablet   Oral   Take 20 mEq by mouth daily.         . predniSONE (DELTASONE) 5 MG tablet   Oral   Take 5 mg by mouth every evening.          . simvastatin (ZOCOR) 40 MG tablet   Oral   Take 40 mg by mouth daily with lunch.          . traZODone (DESYREL) 50 MG tablet   Oral   Take 50 mg by mouth at bedtime as needed for sleep (take if the 100 mg dose is not effective.).          BP 171/103  Pulse 122  Temp(Src) 100 F (37.8 C) (Oral)  Resp 16  Ht 5' 7"  (1.702 m)  Wt 130 lb (58.968 kg)  BMI 20.36 kg/m2  SpO2 96% Physical Exam  Nursing note and  vitals reviewed. Constitutional: He is oriented to person, place, and time. He appears well-developed and well-nourished.  HENT:  Head: Normocephalic and atraumatic.  Right Ear: External ear normal.  Left Ear: External ear normal.  Mouth/Throat: Oropharynx is clear and moist.  Eyes: Conjunctivae and EOM are normal. Pupils are equal, round, and reactive to light.  Neck: Normal range of motion. Neck supple.  Cardiovascular: Normal rate, regular rhythm, normal heart sounds and intact distal pulses.   Pulmonary/Chest: Effort normal and breath sounds normal. No respiratory distress. He has no wheezes. He has no rales. He exhibits no tenderness.  Abdominal: Soft. Bowel sounds are normal. He exhibits no distension and no mass. There is tenderness. There is no rebound and no guarding.  Mild diffuse TTP, soft, nondistended, no guarding, no rebound.  Musculoskeletal: Normal range of motion.  Neurological: He is alert and oriented to person, place, and time.  CN II-XII grossly intact.  No gross sensory or motor deficits.  Skin: Skin is warm and dry.    ED Course  Procedures (including critical care time) Labs Review Labs Reviewed  CBC WITH DIFFERENTIAL - Abnormal; Notable for the following:    WBC 11.2 (*)    HCT 38.0 (*)    Neutro Abs 8.1 (*)    Monocytes Relative 14 (*)    Monocytes Absolute 1.6 (*)    All other components within normal limits  COMPREHENSIVE METABOLIC PANEL - Abnormal; Notable for the following:    Sodium 126 (*)    Potassium 3.6 (*)    Chloride 86 (*)    Albumin 3.2 (*)    All other components within normal limits  LIPASE, BLOOD - Abnormal; Notable for the following:    Lipase 67 (*)    All other components within normal limits  URINALYSIS, ROUTINE W REFLEX MICROSCOPIC - Abnormal; Notable for the following:    Color,  Urine AMBER (*)    Bilirubin Urine SMALL (*)    Ketones, ur >80 (*)    All other components within normal limits  URINE CULTURE  CULTURE, BLOOD  (ROUTINE X 2)  CULTURE, BLOOD (ROUTINE X 2)  LACTIC ACID, PLASMA  PROCALCITONIN  GLUCOSE, CAPILLARY  INFLUENZA PANEL BY PCR (TYPE A & B, L8H9)  BASIC METABOLIC PANEL  CBC  POCT LACTIC ACID (LACTATE)   Imaging Review Dg Chest Port 1 View  05/23/2013   CLINICAL DATA:  Nausea and shortness of breath.  EXAM: PORTABLE CHEST - 1 VIEW  COMPARISON:  PA and lateral chest 05/08/2013 in 07/14/2012.  FINDINGS: Marked elevation of the right hemidiaphragm relative to the left is unchanged. Lungs are clear. Heart size is normal. No pneumothorax or pleural effusion. Bilateral shoulder replacements noted.  IMPRESSION: No acute disease.  Stable compared to prior exam.   Electronically Signed   By: Inge Rise M.D.   On: 05/23/2013 19:09    EKG Interpretation   None       MDM   Final diagnoses:  None   68 yo M PMHx of RA arthritis on steroids (Humira d/c'd recently due to infection), posterior lumbar fusion (03/19/13) with post-op pseudomonas infection s/p debridement and treatment with 40 days of Cefepime ending Jan 23rd (developed hives towards the end off treatment but completed course), COPD, hypertension, dyslipidemia, recent SBO who presents with CC of nausea, fatigue.   Filed Vitals:   05/23/13 2143  BP:   Pulse:   Temp: 101.8 F (38.8 C)  Resp:    Physical exam as above.  Pt febrile Tmax 102, tachycardic, hypertensive.  On exam no focal deficits, lungs clear bilaterally, some mild diffuse abdominal pain.  Pt given NS bolus, zofran for symptoms.  EKG HR 101, PR 174, QRS 90, QT/QTc 388/503, sinus tachycardia, muliple PVCs, LAE.  Labs show leukocytosis, Hgb WNL, profound hyponatremia 126, hypochloremia 86.  Mildly elevated Lipase.  LA 0.7.  UA negative for infection, but with > 80 ketones.  CXR negative for acute disease.  Diagnosis of SIRS, hyponatremia, hypochloremia, severe malnourishment.  Pt started on Vancomycin, Imipenum for broad spectrum empiric coverage, in pt with multiple  recent admissions for pseudomonal infx s/p posterior spinal fusion, sepsis 2/2 SBO.    Pt to be admitted to medicine service for further evaluation and care.  Pt and family understand and agree with plan.  I have discussed this patient's care plan with Dr. Karle Starch.  Sinda Du, MD   Sinda Du, MD 05/24/13 [phone]

## 2013-05-23 NOTE — ED Notes (Addendum)
Pt son brought him from home because his wife can not take care of him anymore. He has had several hospitalizations over past months and his wife has to take total care of him at home and now shes sick and she called her son today and told him she can not take care of him. Son states the pt could live with him and his wife but he just wants to make sure the pt is not getting sick again because hes been c/o nausea and has seemed fatigued. The pt also c/o abd pain

## 2013-05-23 NOTE — ED Notes (Signed)
Pt is confused as to time and date-- does not know when his surgery was, thinks it was in Oct. Thinks it is March.  Has been incontinent of urine, large amount in brief. Has been unable to control bowel and bladder.  States has an appt with an ENT doctor regarding voice-- has been unable to speak more than a whisper since surgery.

## 2013-05-23 NOTE — H&P (Signed)
PATIENT DETAILS Name: [name] Age: 68 y.o. Sex: male Date of Birth: 1945/10/09 Admit Date: 05/23/2013 DJM:[id number] Norins, MD   CHIEF COMPLAINT:  Generalized weakness Fever, nausea and vomiting for the past 3-4 days  HPI: [name] is a 68 y.o. male with a Past Medical History of , recent back surgery complicated by a wound infection, malnutrition, diarrhea, laryngitis, multiple hospitalizations over the past few months was discharged almost 2 weeks back who presents today with the above noted complaint. Patient is a very poor historian, most of this history is obtained from the patient's son at bedside. Upon discharge from this facility approximately 2 weeks back, patient has not been feeling well. His girlfriend has flulike symptoms for the past few days, patient for the past few days has gotten progressively weaker, he has not been ambulating and not eating well. Per son, patient has had nausea and vomiting. Sometimes it the patient has had numerous frequent falls since discharge. Patient also complains of a dry cough. Since the patient is essentially bedbound and requires significant amount of care which cannot be provided by the patient's girlfriend who is now sick, patient was brought to the ED for further evaluation. In the emergency room, patient was found to be pleasantly confused, febrile with a fever of 102.5, sodium levels of 126. I was subsequently asked to admit this patient for further evaluation and treatment. Per patient's son, patient was so weak today that patient could not even walk and stand up, the patient's family had to literally carry the patient to the car and bring him to the emergency room. Slightly confused, patient does answer some of the questions appropriately. He claims that he's been incontinent of both stool and urine for the past 5 months. He denies any ongoing headaches, chest pain or shortness of breath. He claims he has been weak,  with no appetite has not walked since discharge. He claims that physical therapy has only come out to his house once so far.   ALLERGIES:   Allergies  Allergen Reactions  . Morphine And Related Shortness Of Breath, Nausea And Vomiting, Swelling and Other (See Comments)    Agitation, tolerates dilaudid  . Penicillins Hives, Shortness Of Breath and Rash    REACTION: hives, breathing problems  . Cefixime     rash  . Percocet [Oxycodone-Acetaminophen] Anxiety    PAST MEDICAL HISTORY: Past Medical History  Diagnosis Date  . Hx of colonic polyps   . Diverticulosis   . Hypertension   . Osteoarthritis   . 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 CAD (coronary artery disease)     minimal coronary plaque in the LAD and right coronary system. 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 7-06. Last nuclear stress 11-17-06 fixed anterior/ inferior defect, no inducible ischemia, EF 81%  . Allergic rhinitis   . Hx of colonoscopy   . RA (rheumatoid arthritis)   . COPD (chronic obstructive pulmonary disease)   . PONV (postoperative nausea and vomiting)   . Dysrhythmia 01-24-12    past hx. A.Fib x1 episode-responded to med.  . Depression   . Anxiety     pt. feels its related to the medicine that he takes   . GERD (gastroesophageal reflux disease)     PAST SURGICAL HISTORY: Past Surgical History  Procedure Laterality Date  .  Appendectomy    . Inguinal hernia repair  2008    Right, left remotely  . Total knee arthroplasty      right knee partial 2002  . Tonsillectomy    . Shoulder preplacement  2008    left, partial  . Ptca w/ coated stent lad      Cx secondary to tear  . Foot surgery  11-08    for removal of bone spurs- right foot  . Reconstruction of ankle  6-09    Right- [facility]  . L4-5 diskectomy w/ fusion, cage placement and rods  12-10    [place]  . Partial knee  arthroplasty      left  . Total shoulder replacement      right  . Joint replacement    . Cataract extraction, bilateral  01-24-12    Bilateral  . Orif shoulder fracture  02/06/2012    Procedure: OPEN REDUCTION INTERNAL FIXATION (ORIF) SHOULDER FRACTURE;  Surgeon: Nita Sells, MD;  Location: [facility];  Service: Orthopedics;  Laterality: Left;  ORIF of a Left Shoulder Fracture with  Iliac Crest Bone Graft aspiration   . Harvest bone graft  02/06/2012    Procedure: HARVEST ILIAC BONE GRAFT;  Surgeon: Nita Sells, MD;  Location: [facility];  Service: Orthopedics;;  bone marrow aspirqation   . Hardware removal  03/09/2012    Procedure: HARDWARE REMOVAL;  Surgeon: Nita Sells, MD;  Location: [place];  Service: Orthopedics;  Laterality: Left;  Hardware Removal from Left Shoulder  . Cardiac catheterization  2006    coronary stents- no problems  . Shoulder arthroscopy  2013  . Carpal tunnel release      R hand  . Ankle surgery Right 2008    at [facility], replacement   . Dg toes*r*      toe surgery- 07/2012  . Posterior lumbar fusion 4 level N/A 03/02/2013    Procedure: Lumbar One to Sacral One Posterior lumbar interbody fusion;  Surgeon: Floyce Stakes, MD;  Location: [place] [facility];  Service: Neurosurgery;  Laterality: N/A;  L1 to S1 Posterior lumbar interbody fusion  . Lumbar wound debridement N/A 03/17/2013    Procedure: Incision and drainage of superficial lumbar wound;  Surgeon: Floyce Stakes, MD;  Location: [place] [facility];  Service: Neurosurgery;  Laterality: N/A;  Incision and drainage of superficial lumbar wound    MEDICATIONS AT HOME: Prior to Admission medications   Medication Sig Start Date End Date Taking? Authorizing Provider  Aclidinium Bromide (TUDORZA PRESSAIR) 400 MCG/ACT AEPB Inhale 1 puff into the lungs 2 (two) times daily. 01/23/12  Yes Neena Rhymes, MD  Aclidinium Bromide 400 MCG/ACT AEPB Inhale 2 puffs into the lungs at  bedtime.   Yes Historical Provider, MD  ALPRAZolam Duanne Moron) 0.5 MG tablet Take 1 tablet (0.5 mg total) by mouth 3 (three) times daily as needed for anxiety or sleep. 03/11/13  Yes Tiffany L Reed, DO  aspirin (ASPIRIN EC) 81 MG EC tablet Take 81 mg by mouth every morning.    Yes Historical Provider, MD  celecoxib (CELEBREX) 200 MG capsule Take 200 mg by mouth 2 (two) times daily.   Yes Historical Provider, MD  cholecalciferol (VITAMIN D) 1000 UNITS tablet Take 1 tablet (1,000 Units total) by mouth daily. 06/24/12 06/24/13 Yes Evie Lacks Plotnikov, MD  colchicine 0.6 MG tablet Take 0.6 mg by mouth 2 (two) times daily.   Yes Historical Provider, MD  docusate sodium (COLACE) 100 MG  capsule Take 1 capsule (100 mg total) by mouth 2 (two) times daily. 04/24/13  Yes Bonnielee Haff, MD  feeding supplement, ENSURE COMPLETE, (ENSURE COMPLETE) LIQD Take 237 mLs by mouth daily. 05/09/13  Yes Thurnell Lose, MD  FLUoxetine (PROZAC) 40 MG capsule Take 40 mg by mouth 2 (two) times daily.   Yes Historical Provider, MD  fluticasone (FLONASE) 50 MCG/ACT nasal spray Place 2 sprays into both nostrils at bedtime.    Yes Historical Provider, MD  furosemide (LASIX) 20 MG tablet Take 20 mg by mouth daily.   Yes Historical Provider, MD  gabapentin (NEURONTIN) 300 MG capsule Take 300-600 mg by mouth 4 (four) times daily. 600 mg every morning, 300 mg at noon, 600 mg at bedtime, and 300 mg at 1 AM   Yes Historical Provider, MD  hydrochlorothiazide (HYDRODIURIL) 25 MG tablet Take 25 mg by mouth daily before breakfast.    Yes Historical Provider, MD  HYDROmorphone (DILAUDID) 4 MG tablet Take 1 tablet (4 mg total) by mouth every 8 (eight) hours as needed for moderate pain or severe pain. MAY FILL ON OR AFTER 07/16/13 05/18/13  Yes Neena Rhymes, MD  losartan (COZAAR) 50 MG tablet Take 50 mg by mouth daily.   Yes Historical Provider, MD  Melatonin ([employer] MELATONIN) 5 MG TABS Take 5 mg by mouth at bedtime.    Yes Historical Provider, MD    Nutritional Supplements (ENSURE COMPLETE PO) Take 0.5 Cans by mouth 2 (two) times daily.   Yes Historical Provider, MD  OxyCODONE (OXYCONTIN) 10 mg T12A 12 hr tablet Take 10 mg by mouth every 12 (twelve) hours.   Yes Historical Provider, MD  OxyCODONE (OXYCONTIN) 20 mg T12A 12 hr tablet Take 20 mg by mouth every 12 (twelve) hours.   Yes Historical Provider, MD  OxyCODONE (OXYCONTIN) 40 mg T12A 12 hr tablet Take 1 tablet (40 mg total) by mouth 2 (two) times daily as needed (pain). MAY FILL ON OR AFTER 07/16/2013 05/18/13  Yes Neena Rhymes, MD  pantoprazole (PROTONIX) 40 MG tablet Take 40 mg by mouth 2 (two) times daily.  07/29/12  Yes Neena Rhymes, MD  Polyethyl Glycol-Propyl Glycol (SYSTANE ULTRA) 0.4-0.3 % SOLN Place 2 drops into both eyes 2 (two) times daily as needed (dry eyes).    Yes Historical Provider, MD  polyethylene glycol (MIRALAX / GLYCOLAX) packet Take 17 g by mouth daily as needed for moderate constipation. 04/24/13  Yes Bonnielee Haff, MD  potassium chloride SA (K-DUR,KLOR-CON) 20 MEQ tablet Take 20 mEq by mouth daily.   Yes Historical Provider, MD  predniSONE (DELTASONE) 5 MG tablet Take 5 mg by mouth every evening.    Yes Historical Provider, MD  simvastatin (ZOCOR) 40 MG tablet Take 40 mg by mouth daily with lunch.    Yes Historical Provider, MD  traZODone (DESYREL) 50 MG tablet Take 50 mg by mouth at bedtime as needed for sleep (take if the 100 mg dose is not effective.).   Yes Historical Provider, MD    FAMILY HISTORY: Family History  Problem Relation Age of Onset  . Uterine cancer Mother     survivor  . Macular degeneration Mother   . Other Mother     ankle edema  . Lung cancer Mother   . Cancer Mother     ovarian, lung  . Coronary artery disease Father   . Hypertension Father   . Prostate cancer Father   . Colon polyps Father   . Cancer Father  prostate  . Thyroid disease Sister   . Heart attack Brother   . Hyperlipidemia Brother   . Other Brother      Schizophrenic  . Diabetes Neg Hx   . Colon cancer Neg Hx   . Esophageal cancer Neg Hx   . Rectal cancer Neg Hx   . Stomach cancer Neg Hx   . Coronary artery disease Maternal Aunt   . Heart attack Maternal Aunt   . Rheum arthritis Sister   . Hemochromatosis Sister     SOCIAL HISTORY:  reports that he has never smoked. He has never used smokeless tobacco. He reports that he does not drink alcohol or use illicit drugs.  REVIEW OF SYSTEMS:  Constitutional:   No recent weight loss, night sweats,   HEENT:    No headaches, Difficulty swallowing,Tooth/dental problems,Sore throat,  No sneezing, itching, ear ache, post nasal drip,   Cardio-vascular: No chest pain,  Orthopnea, PND, swelling in lower extremities, anasarca,  dizziness, palpitations  GI:  No heartburn, indigestion, abdominal pain,  diarrhea, change in  bowel habits, loss of appetite  Resp: No shortness of breath with exertion or at rest.  No excess mucus,   No coughing up of blood.No change in color of mucus.No wheezing.No chest wall deformity  Skin:  no rash or lesions.  GU:  no dysuria, change in color of urine, no urgency or frequency.  No flank pain.  Musculoskeletal: No joint pain or swelling.  No decreased range of motion.  No back pain.  Psych: No change in mood or affect. No depression or anxiety.  No memory loss.   PHYSICAL EXAM: Blood pressure 168/100, pulse 99, temperature 102.5 F (39.2 C), temperature source Rectal, resp. rate 22, height 5' 7"  (1.702 m), weight 58.968 kg (130 lb), SpO2 98.00%.  General appearance :Awake,mildly confused,not in any distress. Speech Clear. Not toxic Looking HEENT: Atraumatic and Normocephalic, pupils equally reactive to light and accomodation Neck: supple, no JVD. No cervical lymphadenopathy.  Chest:Good air entry bilaterally, no added sounds  CVS: S1 S2 regular, no murmurs.  Abdomen: Bowel sounds present, Non tender and not distended with no gaurding, rigidity or  rebound. Extremities: B/L Lower Ext shows no edema, both legs are warm to touch Neurology: Has generalized weakness, but non focal. Sensation is grossly intact. Strength 4/5 in all 4 extremities. Skin:No Rash Wounds:N/A  LABS ON ADMISSION:   Recent Labs  05/23/13 1435  NA 126*  K 3.6*  CL 86*  CO2 20  GLUCOSE 94  BUN 13  CREATININE 0.51  CALCIUM 8.8    Recent Labs  05/23/13 1435  AST 28  ALT 13  ALKPHOS 85  BILITOT 1.0  PROT 7.2  ALBUMIN 3.2*    Recent Labs  05/23/13 1435  LIPASE 67*    Recent Labs  05/23/13 1435  WBC 11.2*  NEUTROABS 8.1*  HGB 13.3  HCT 38.0*  MCV 81.7  PLT 365   No results found for this basename: CKTOTAL, CKMB, CKMBINDEX, TROPONINI,  in the last 72 hours No results found for this basename: DDIMER,  in the last 72 hours No components found with this basename: POCBNP,    RADIOLOGIC STUDIES ON ADMISSION: No results found.   EKG: Independently reviewed. Sinus tachycardia. ASSESSMENT AND PLAN: Present on Admission:  . Hyponatremia - Secondary to dehydration and HCTZ therapy.  - Stop HCTZ, hydrate, and recheck electrolytes in a.m. If persistently hyponatremic then will commence workup.   [name] SIRS (systemic inflammatory response syndrome) -  Likely secondary to a unknown foci of infection-multiple sources of infection-awaiting chest x-ray, UA, influenza PCR. Has had recent back surgery, however the wound although open looks good without discharge or surrounding erythema.  - Obtain blood cultures, urine cultures, chest x-ray, influenza PCR, subsequently we'll treat empirically with vancomycin and cefepime along with Tamiflu. Will narrow antibiotics based on clinical course and culture results.   . Acute encephalopathy - Secondary to toxic metabolic encephalopathy- likely secondary to an infectious etiology. Although polypharmacy could be contributing as well-would decrease doses of narcotics, cautiously administer narcotics and  benzodiazepines (holding parameters ordered) - Await workup as noted above, empirically treat with antibiotics and see if better.   . Dehydration - Will hydrate with IV fluids, reassess in a.m.  [name] Fever of undetermined origin - Febrile with a temperature of 102F here in the emergency room  - No foci evident on exam or by history (exit nausea/vomiting) - await UA, chest x-ray, influenza PCR, blood cultures. In the meantime, we'll empirically treat with vancomycin and cefepime. Has had back surgery done a few months ago, wound looks okay. If above workup negative, may need to image the lumbosacral spine.   [name] HYPERTENSION - Hold Lasix, HCTZ, cautiously continue with losartan.   . Severe Malnutrition - Continue supplements, obtain nutrition consult.   [name] PSEUDOGOUT - No evidence of flare, continue close itching   . Rheumatoid arthritis - On chronic prednisone 5 mg daily-giving acute illness, will double dose to 10 mg.   . Chronic narcotic use - Cautiously continue-with holding parameters. See above.   [name] COPD (chronic obstructive pulmonary disease) - Lungs clear  - As needed bronchodilators. Continues Spiriva   . frequent falls - PT/OT eval  . Failure to thrive - Nutritional eval, PT eval, needs skilled nursing facility placement on discharge.  Further plan will depend as patient's clinical course evolves and further radiologic and laboratory data become available. Patient will be monitored closely.  Above noted plan was discussed with patient/Son, they were in agreement.   DVT Prophylaxis: Prophylactic Lovenox   Code Status: Full Code  Total time spent for admission equals 45 minutes.  [facility] Pager [phone]  If 7PM-7AM, please contact night-coverage [email or web address] Password [facility] 05/23/2013, 6:55 PM

## 2013-05-24 ENCOUNTER — Encounter (HOSPITAL_COMMUNITY): Payer: Self-pay | Admitting: General Practice

## 2013-05-24 DIAGNOSIS — N179 Acute kidney failure, unspecified: Secondary | ICD-10-CM

## 2013-05-24 DIAGNOSIS — J9600 Acute respiratory failure, unspecified whether with hypoxia or hypercapnia: Secondary | ICD-10-CM

## 2013-05-24 DIAGNOSIS — I4891 Unspecified atrial fibrillation: Secondary | ICD-10-CM

## 2013-05-24 LAB — URINE CULTURE: Colony Count: 25000

## 2013-05-24 LAB — GLUCOSE, CAPILLARY
Glucose-Capillary: 108 mg/dL — ABNORMAL HIGH (ref 70–99)
Glucose-Capillary: 190 mg/dL — ABNORMAL HIGH (ref 70–99)
Glucose-Capillary: 131 mg/dL — ABNORMAL HIGH (ref 70–99)
Glucose-Capillary: 114 mg/dL — ABNORMAL HIGH (ref 70–99)

## 2013-05-24 LAB — BASIC METABOLIC PANEL
BUN: 11 mg/dL (ref 6–23)
CO2: 21 mEq/L (ref 19–32)
Calcium: 7.9 mg/dL — ABNORMAL LOW (ref 8.4–10.5)
Chloride: 89 mEq/L — ABNORMAL LOW (ref 96–112)
Creatinine, Ser: 0.43 mg/dL — ABNORMAL LOW (ref 0.50–1.35)
GFR calc Af Amer: >90 mL/min (ref >90)
GFR calc non Af Amer: >90 mL/min (ref >90)
Glucose, Bld: 112 mg/dL — ABNORMAL HIGH (ref 70–99)
Potassium: 3.4 mEq/L — ABNORMAL LOW (ref 3.7–5.3)
Sodium: 124 mEq/L — ABNORMAL LOW (ref 137–147)

## 2013-05-24 LAB — INFLUENZA PANEL BY PCR (TYPE A & B)
H1N1 flu by pcr: NOT DETECTED
Influenza A By PCR: NEGATIVE
Influenza B By PCR: NEGATIVE

## 2013-05-24 LAB — CBC
HCT: 34.2 % — ABNORMAL LOW (ref 39.0–52.0)
Hemoglobin: 11.8 g/dL — ABNORMAL LOW (ref 13.0–17.0)
MCH: 28.2 pg (ref 26.0–34.0)
MCHC: 34.5 g/dL (ref 30.0–36.0)
MCV: 81.8 fL (ref 78.0–100.0)
Platelets: 342 K/uL (ref 150–400)
RBC: 4.18 MIL/uL — ABNORMAL LOW (ref 4.22–5.81)
RDW: 14.4 % (ref 11.5–15.5)
WBC: 8.1 K/uL (ref 4.0–10.5)

## 2013-05-24 LAB — CULTURE, BLOOD (SINGLE)
Organism ID, Bacteria: NO GROWTH
Organism ID, Bacteria: NO GROWTH

## 2013-05-24 LAB — OSMOLALITY: Osmolality: 263 mosm/kg — ABNORMAL LOW (ref 275–300)

## 2013-05-24 LAB — SODIUM, URINE, RANDOM: Sodium, Ur: 84 meq/L

## 2013-05-24 MED ORDER — METOPROLOL TARTRATE 25 MG PO TABS
25.0000 mg | ORAL_TABLET | Freq: Two times a day (BID) | ORAL | Status: DC
  Administered 2013-05-24 – 2013-05-27 (×7): 25 mg via ORAL
  Filled 2013-05-24 (×8): qty 1

## 2013-05-24 NOTE — Progress Notes (Signed)
Patient ID: ARAV SCROGGIN  male  PIR:[id number]    DOB: 1945-04-14    DOA: 05/23/2013  PCP: Illene Regulus, MD  Assessment/Plan:  . Hyponatremia  - Secondary to dehydration and HCTZ therapy.  - Will check serum osm, serum osm, urine sodium, continue IV fluid hydration   . SIRS (systemic inflammatory response syndrome)  - Likely secondary to a unknown foci of infection-multiple sources of infection -Chest x-ray clear UA negative for UTI, pending influenza PCR.  - Has had recent back surgery, however the wound although open looks good without discharge or surrounding erythema.  -Follow blood cultures, urine cultures, continue empirically with vancomycin and imipenem, Tamiflu. Will narrow antibiotics based on clinical course and culture results.   . Acute encephalopathy improving - Secondary to toxic metabolic encephalopathy- likely secondary to an infectious etiology. Although polypharmacy could be contributing as well-would decrease doses of narcotics, cautiously administer narcotics and benzodiazepines (holding parameters ordered)  - Await workup as noted above, empirically treat with antibiotics and see if better.   . Dehydration  - Will hydrate with IV fluids, reassess in a.m.   [name] Fever of undetermined origin - afebrile this morning - Febrile with a temperature of 102F here in the emergency room  - See #1, had back surgery done a few months ago, wound looks okay. If above workup negative, may need to image the lumbosacral spine.   [name] HYPERTENSION  - Hold Lasix, HCTZ, cautiously continue with losartan.   . Severe Malnutrition   - Continue supplements  . PSEUDOGOUT  - No evidence of flare  . Rheumatoid arthritis  - On chronic prednisone 5 mg daily-giving acute illness, will double dose to 10 mg.   . Chronic narcotic use  - Cautiously continue-with holding parameters. See above.   [name] COPD (chronic obstructive pulmonary disease)  - Lungs clear  - As needed bronchodilators.  Continues Spiriva   . frequent falls  - PT/OT eval   . Failure to thrive  - Nutritional eval, PT eval, needs skilled nursing facility placement on discharge.   DVT Prophylaxis:  Code Status:  Family Communication: Discussed in detail with patient's daughter, Efraim Kaufmann at the bedside. Her phone number is [phone]  Disposition: Will likely need skilled nursing facility    Subjective: Patient smiling but confused, denies any pain  Objective: Weight change:   Intake/Output Summary (Last 24 hours) at 05/24/13 1249 Last data filed at 05/24/13 0449  Gross per 24 hour  Intake      0 ml  Output    325 ml  Net   -325 ml   Blood pressure 156/100, pulse 89, temperature 98.1 F (36.7 C), temperature source Oral, resp. rate 89, height 5\' 6"  (1.676 m), weight 58.469 kg (128 lb 14.4 oz), SpO2 98.00%.  Physical Exam: General: Alert and awake, oriented to self CVS: S1-S2 clear, no murmur rubs or gallops Chest: clear to auscultation bilaterally, no wheezing, rales or rhonchi Abdomen: soft nontender, nondistended, normal bowel sounds  Extremities: no cyanosis, clubbing or edema noted bilaterally Neuro: Cranial nerves II-XII intact, strength 4/ 5 in all 4 extremities  Lab Results: Basic Metabolic Panel:  Recent Labs Lab 05/23/13 1435 05/24/13 0530  NA 126* 124*  K 3.6* 3.4*  CL 86* 89*  CO2 20 21  GLUCOSE 94 112*  BUN 13 11  CREATININE 0.51 0.43*  CALCIUM 8.8 7.9*   Liver Function Tests:  Recent Labs Lab 05/23/13 1435  AST 28  ALT 13  ALKPHOS 85  BILITOT  1.0  PROT 7.2  ALBUMIN 3.2*    Recent Labs Lab 05/23/13 1435  LIPASE 67*   No results found for this basename: AMMONIA,  in the last 168 hours CBC:  Recent Labs Lab 05/23/13 1435 05/24/13 0530  WBC 11.2* 8.1  NEUTROABS 8.1*  --   HGB 13.3 11.8*  HCT 38.0* 34.2*  MCV 81.7 81.8  PLT 365 342   Cardiac Enzymes: No results found for this basename: CKTOTAL, CKMB, CKMBINDEX, TROPONINI,  in the last 168  hours BNP: No components found with this basename: POCBNP,  CBG:  Recent Labs Lab 05/23/13 2307 05/24/13 0824 05/24/13 1149  GLUCAP 82 108* 190*     Micro Results: Recent Results (from the past 240 hour(s))  CULTURE, BLOOD (SINGLE)     Status: None   Collection Time    05/18/13  4:01 PM      Result Value Ref Range Status   Organism ID, Bacteria NO GROWTH 5 DAYS   Final  CULTURE, BLOOD (SINGLE)     Status: None   Collection Time    05/18/13  4:01 PM      Result Value Ref Range Status   Organism ID, Bacteria NO GROWTH 5 DAYS   Final    Studies/Results: Dg Chest 2 View  05/08/2013   CLINICAL DATA:  Cough, sudden onset congestion  EXAM: CHEST  2 VIEW  COMPARISON:  DG CHEST 1V PORT dated 05/06/2013; DG CHEST 1V PORT dated 03/22/2013  FINDINGS: There is elevation the right diaphragm. There is no focal parenchymal opacity, pleural effusion, or pneumothorax. The heart and mediastinal contours are unremarkable.  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 There are degenerative changes of the right acromioclavicular joint. There is as a chronic injury of the left acromioclavicular joint.  IMPRESSION: No active cardiopulmonary disease.   Electronically Signed   By: Elige Ko   On: 05/08/2013 13:16   Ct Head Wo Contrast  05/06/2013   CLINICAL DATA:  Altered mental status.  Patient fell.  EXAM: CT HEAD WITHOUT CONTRAST  TECHNIQUE: Contiguous axial images were obtained from the base of the skull through the vertex without intravenous contrast.  COMPARISON:  CT scan dated 07/14/2012  FINDINGS: No mass lesion. No midline shift. No acute hemorrhage or hematoma. No extra-axial fluid collections. No evidence of acute infarction. Brain parenchyma is normal except for mild cerebral cortical atrophy. No osseous abnormality.  IMPRESSION: No acute abnormality.  No change since the prior exam.   Electronically Signed   By: Geanie Cooley M.D.   On: 05/06/2013 14:21   Ct Cervical Spine Wo  Contrast  05/06/2013   CLINICAL DATA:  Altered mental status after a fall.  EXAM: CT CERVICAL SPINE WITHOUT CONTRAST  TECHNIQUE: Multidetector CT imaging of the cervical spine was performed without intravenous contrast. Multiplanar CT image reconstructions were also generated.  COMPARISON:  None.  FINDINGS: There is no acute fracture.  No prevertebral soft tissue swelling.  The patient has severe degenerative disc disease at C2-3 with a 2 mm retrolisthesis of C2 on C3. There is a 3 mm anterolisthesis of C4 on C5 due to moderate left facet arthritis. This is not felt to be acute.  There is fairly severe degenerative disc disease at C5-6 and C6-7. There is a 2 mm anterolisthesis of C7 on T1 due to severe bilateral facet arthritis.  There is fairly severe right facet arthritis at C2-3 and C3-4.  The study is degraded by motion artifact but I feel that it  is diagnostic.  IMPRESSION: No acute abnormality of the cervical spine. Multilevel degenerative disc and joint disease. Identified subluxations are felt to be chronic.   Electronically Signed   By: Geanie Cooley M.D.   On: 05/06/2013 14:26   Dg Chest Port 1 View  05/23/2013   CLINICAL DATA:  Nausea and shortness of breath.  EXAM: PORTABLE CHEST - 1 VIEW  COMPARISON:  PA and lateral chest 05/08/2013 in 07/14/2012.  FINDINGS: Marked elevation of the right hemidiaphragm relative to the left is unchanged. Lungs are clear. Heart size is normal. No pneumothorax or pleural effusion. Bilateral shoulder replacements noted.  IMPRESSION: No acute disease.  Stable compared to prior exam.   Electronically Signed   By: Drusilla Kanner M.D.   On: 05/23/2013 19:09   Dg Chest Port 1 View  05/06/2013   CLINICAL DATA:  Shortness of breath, week  EXAM: PORTABLE CHEST - 1 VIEW  COMPARISON:  04/12/2013  FINDINGS: Very limited inspiratory effect. Cannot evaluate heart size as a result. The vascular pattern appears normal. Right lung is clear. No abnormal opacities on the left. Stable  scoliotic curvature thoracolumbar spine.  IMPRESSION: Study limited by very limited inspiratory effect. Grossly negative for acute abnormalities.   Electronically Signed   By: Esperanza Heir M.D.   On: 05/06/2013 13:43    Medications: Scheduled Meds: . aspirin EC  81 mg Oral q morning - 10a  . cholecalciferol  1,000 Units Oral Daily  . colchicine  0.6 mg Oral BID  . docusate sodium  100 mg Oral BID  . enoxaparin (LOVENOX) injection  40 mg Subcutaneous Q24H  . feeding supplement (ENSURE COMPLETE)  237 mL Oral TID WC  . FLUoxetine  40 mg Oral BID  . fluticasone  2 spray Each Nare QHS  . gabapentin  300 mg Oral 2 times per day  . gabapentin  600 mg Oral 2 times per day  . imipenem-cilastatin  250 mg Intravenous Q6H  . losartan  50 mg Oral Daily  . oseltamivir  75 mg Oral BID  . OxyCODONE  10 mg Oral Q12H  . pantoprazole  40 mg Oral BID  . predniSONE  10 mg Oral QPM  . simvastatin  40 mg Oral Q lunch  . sodium chloride  3 mL Intravenous Q12H  . tiotropium  18 mcg Inhalation Daily  . vancomycin  750 mg Intravenous Q12H      LOS: 1 day   Valborg Friar M.D. [facility] 05/24/2013, 12:49 PM Pager: [phone]  If 7PM-7AM, please contact night-coverage [email or web address] Password [email or web address]

## 2013-05-24 NOTE — Progress Notes (Signed)
Patient was screened by Gerlean Ren for appropriateness for an Inpatient Acute Rehab consult.  At this time, we are recommending Inpatient Rehab consult.  Please order when you feel appropriate.   [place] Admissions Coordinator Cell [phone] Office [phone]

## 2013-05-24 NOTE — Evaluation (Signed)
Physical Therapy Evaluation Patient Details Name: [name] MRN: [id number] DOB: Oct 08, 1945 Today's Date: 05/24/2013 Time: 0925-1006 PT Time Calculation (min): 41 min  PT Assessment / Plan / Recommendation History of Present Illness  68 y.o. male admitted to [facility] on 05/23/13 with Past Medical History of , recent back surgery complicated by a wound infection, malnutrition, diarrhea, laryngitis, multiple hospitalizations. He presents this admission with fever, N/V and weakness.  Dx with hyponatremia, SIRS, FTT, fever of unknown origin, dehydration and encephalopathy.  He is being r/o for flu as he reports his wife currently has the flu at home.  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    Clinical Impression  Pt is very deconditioned and weak on his feet.  He Has significant recent history of multiple falls at home with his wife.  He is motivated to get better and get back home and is seeking "intensive" rehab.  He would be an excellent inpatient rehab candidate.   PT to follow acutely for deficits listed below.       PT Assessment  Patient needs continued PT services    Follow Up Recommendations  CIR    Does the patient have the potential to tolerate intense rehabilitation     NA  Barriers to Discharge Decreased caregiver support wife works ~20 hours per week and is currently sick herself.      Equipment Recommendations  None recommended by PT    Recommendations for Other Services Rehab consult   Frequency Min 3X/week    Precautions / Restrictions Precautions Precautions: Back Precaution Comments: reviewd back precautuions from surgery in November.   Required Braces or Orthoses:  (pt is no longer using back brace)   Pertinent Vitals/Pain See vitals flow sheet.      Mobility  Bed Mobility Overal bed mobility: Needs Assistance Bed Mobility: Rolling;Sidelying to Sit Rolling: Mod  assist Sidelying to sit: Mod assist;HOB elevated General bed mobility comments: mod assist to support trunk during transitions, reinforced back precautions and log roll technique.   Transfers Overall transfer level: Needs assistance Equipment used: Rolling walker (2 wheeled) Transfers: Sit to/from Stand Sit to Stand: Mod assist General transfer comment: Mod assist to support trunk over weak legs during transitions.  Verbal cues for safe hand placement.  Ambulation/Gait Ambulation/Gait assistance: Mod assist Ambulation Distance (Feet): 150 Feet Assistive device: Rolling walker (2 wheeled) Gait Pattern/deviations: Step-through pattern;Decreased step length - left;Decreased dorsiflexion - left Gait velocity: decreased Gait velocity interpretation: <1.8 ft/sec, indicative of risk for recurrent falls General Gait Details: Pt started as a min assist to support trunk over weak leg (flexed bil in stance during gait), but increased to mod assist to support trunk as pt fatigued the second half of the walk.  Also, decreased bil L>R foot clearance and progression as he fatigued with gait.          PT Diagnosis: Difficulty walking;Abnormality of gait;Generalized weakness;Acute pain  PT Problem List: Decreased strength;Decreased activity tolerance;Decreased balance;Decreased mobility;Decreased knowledge of use of DME;Pain PT Treatment Interventions: DME instruction;Gait training;Stair training;Therapeutic activities;Functional mobility training;Therapeutic exercise;Balance training;Neuromuscular re-education;Patient/family education;Modalities     PT Goals(Current goals can be found in the care plan section) Acute Rehab PT Goals Patient Stated Goal: to get intensive rehab PT Goal Formulation: With patient Time For Goal Achievement: 06/07/13 Potential to Achieve Goals: Good  Visit Information  Last PT Received On: 05/24/13 Assistance Needed: +1 History of Present Illness: 68 y.o.  male admitted  to [facility] on 05/23/13 with Past Medical History of , recent back surgery complicated by a wound infection, malnutrition, diarrhea, laryngitis, multiple hospitalizations. He presents this admission with fever, N/V and weakness.  Dx with hyponatremia, SIRS, FTT, fever of unknown origin, dehydration and encephalopathy.  He is being r/o for flu as he reports his wife currently has the flu at home.  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         Prior Functioning  Home Living Family/patient expects to be discharged to:: Private residence Living Arrangements: Spouse/significant other (wife works for hospice part time 20hours per week) Available Help at Discharge: Family;Available PRN/intermittently Type of Home: House Home Access: Stairs to enter [employer] of Steps: 5 Entrance Stairs-Rails: Right;Left Home Layout: Two level;Able to live on main level with bedroom/bathroom Home Equipment: Dan Humphreys - 2 wheels;Cane - single point;Shower seat;Hand held shower head;Wheelchair - manual;Hospital bed Prior Function Level of Independence: Independent with assistive device(s) Comments: Pt has been walking short distances with a RW, but reports daily falls due to leg weakness.  Communication Communication: No difficulties Dominant Hand: Right    Cognition  Cognition Arousal/Alertness: Awake/alert Behavior During Therapy: WFL for tasks assessed/performed Overall Cognitive Status: Within Functional Limits for tasks assessed (not specifically tested)    Extremity/Trunk Assessment Upper Extremity Assessment Upper Extremity Assessment: Defer to OT evaluation Lower Extremity Assessment Lower Extremity Assessment: RLE deficits/detail;LLE deficits/detail RLE Deficits / Details: bil leg weakness, during supine testing right weaker than left, however during gait more left foot drag with fatigue than right.  Grossly  3+/5 right and 4/5 left leg during supine MMT.  LLE Deficits / Details: bil leg weakness, during supine testing right weaker than left, however during gait more left foot drag with fatigue than right.  Grossly 3+/5 right and 4/5 left leg during supine MMT.  Cervical / Trunk Assessment Cervical / Trunk Assessment: Other exceptions Cervical / Trunk Exceptions: h/o low back surgery in Nov of 2014 (multi level fusion) and then again in Dec (due to wound infection).     Balance Balance Overall balance assessment: Needs assistance Sitting-balance support: Bilateral upper extremity supported;Feet supported Sitting balance-Leahy Scale: Good Standing balance support: Bilateral upper extremity supported Standing balance-Leahy Scale: Poor  End of Session PT - End of Session Equipment Utilized During Treatment: Gait belt Activity Tolerance: Patient limited by fatigue Patient left: in chair;with call bell/phone within reach;with family/visitor present Nurse Communication: Mobility status    Lurena Joiner B. Destine Zirkle, PT, DPT [phone]   05/24/2013, 1:46 PM

## 2013-05-24 NOTE — Care Management Note (Unsigned)
    Page 1 of 1   05/24/2013     1:50:38 PM   CARE MANAGEMENT NOTE 05/24/2013  Patient:  [name], [name]   Account Number:  [id number]  Date Initiated:  05/24/2013  Documentation initiated by:  GRAVES-BIGELOW,Hadley Soileau  Subjective/Objective Assessment:   Pt admitted for weakness and N/V. Treating for hyponatremia.     Action/Plan:   CM will continue to monitor for disposition needs. Plan may be for CIR vs SNF.   Anticipated DC Date:  05/26/2013   Anticipated DC Plan:  IP REHAB FACILITY      DC Planning Services  CM consult      Choice offered to / List presented to:             Status of service:  In process, will continue to follow Medicare Important Message given?   (If response is "NO", the following Medicare IM given date fields will be blank) Date Medicare IM given:   Date Additional Medicare IM given:    Discharge Disposition:    Per UR Regulation:  Reviewed for med. necessity/level of care/duration of stay  If discussed at [place] of Stay Meetings, dates discussed:    Comments:

## 2013-05-24 NOTE — Progress Notes (Signed)
Pt hr increasing 160's non-sustaining. Pt asymptomatic laying in the bed, MD on call made aware. No new orders will continue to monitor.

## 2013-05-24 NOTE — Progress Notes (Addendum)
CSW spoke to patient about SNF. Patient is not interested in SNF placement. Clinical Social Worker will sign off for now as social work intervention is no longer needed. Please consult [place] again if new need arises.  Rhea Pink, MSW, [place]

## 2013-05-24 NOTE — Progress Notes (Signed)
Occupational Therapy Evaluation Patient Details Name: [name] MRN: [id number] DOB: 09-28-1945 Today's Date: 05/24/2013 Time: 8657-8469 OT Time Calculation (min): 22 min  OT Assessment / Plan / Recommendation History of present illness 68 y.o. male admitted to [facility] on 05/23/13 with Past Medical History of , recent back surgery complicated by a wound infection, malnutrition, diarrhea, laryngitis, multiple hospitalizations. He presents this admission with fever, N/V and weakness.  Dx with hyponatremia, SIRS, FTT, fever of unknown origin, dehydration and encephalopathy.  He is being r/o for flu as he reports his wife currently has the flu at home.  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     Clinical Impression   Pt with multiple hospitalizations. Pt extemely deconditioned and would benefit cfrom intensive rehab @ [facility] to facilitate safe D/C home with supportive family, maximize level of independence with ADL and mobility and reduce likelihood of hospital readmissions. Pt will benefit from skilled OT services to facilitate D/C to next venue due to below deficits.    OT Assessment  Patient needs continued OT Services    Follow Up Recommendations  CIR;Supervision/Assistance - 24 hour    Barriers to Discharge      Equipment Recommendations  None recommended by OT    Recommendations for Other Services Rehab consult  Frequency  Min 3X/week    Precautions / Restrictions Precautions Precautions: Back Precaution Comments: reviewd back precautuions from surgery in November.   Required Braces or Orthoses:  (pt is no longer using back brace)   Pertinent Vitals/Pain no apparent distress     ADL  Eating/Feeding: Independent Where Assessed - Eating/Feeding: Chair Grooming: Set up Where Assessed - Grooming: Supported sitting Upper Body Bathing: Minimal assistance Where Assessed - Upper Body Bathing:  Supported sitting Lower Body Bathing: Maximal assistance Where Assessed - Lower Body Bathing: Supported sit to stand Upper Body Dressing: Minimal assistance Where Assessed - Upper Body Dressing: Supported sitting Lower Body Dressing: Maximal assistance Where Assessed - Lower Body Dressing: Supported sit to Pharmacist, hospital: Minimal Dentist Method: Sit to stand;Stand pivot Acupuncturist: Bedside commode Toileting - Clothing Manipulation and Hygiene: Maximal assistance (foley) Where Assessed - Glass blower/designer Manipulation and Hygiene: Sit to stand from 3-in-1 or toilet Equipment Used: Gait belt Transfers/Ambulation Related to ADLs: minA ADL Comments: easily fatigues    OT Diagnosis: Generalized weakness;Acute pain  OT Problem List: Decreased strength;Decreased range of motion;Decreased activity tolerance;Impaired balance (sitting and/or standing);Decreased knowledge of use of DME or AE;Pain OT Treatment Interventions: Self-care/ADL training;Therapeutic exercise;Energy conservation;DME and/or AE instruction;Therapeutic activities;Patient/family education;Balance training   OT Goals(Current goals can be found in the care plan section) Acute Rehab OT Goals Patient Stated Goal: to get intensive rehab OT Goal Formulation: With patient Time For Goal Achievement: 06/08/12 Potential to Achieve Goals: Good  Visit Information  Last OT Received On: 05/24/13 Assistance Needed: +1 History of Present Illness: 68 y.o. male admitted to [facility] on 05/23/13 with Past Medical History of , recent back surgery complicated by a wound infection, malnutrition, diarrhea, laryngitis, multiple hospitalizations. He presents this admission with fever, N/V and weakness.  Dx with hyponatremia, SIRS, FTT, fever of unknown origin, dehydration and encephalopathy.  He is being r/o for flu as he reports his wife currently has the flu at home.  Also, of significance, pt with hemocromotosis  with multiple joint surgeries as a result (bil shoulders, bil knees, right ankle).  Prior Functioning     Home Living Family/patient expects to be discharged to:: Private residence Living Arrangements: Spouse/significant other (wife works for hospice part time 20hours per week) Available Help at Discharge: Family;Available PRN/intermittently Type of Home: House Home Access: Stairs to enter [employer] of Steps: 5 Entrance Stairs-Rails: Right;Left Home Layout: Two level;Able to live on main level with bedroom/bathroom Home Equipment: Dan Humphreys - 2 wheels;Cane - single point;Shower seat;Hand held shower head;Wheelchair - manual;Hospital bed  Lives With: Significant other Prior Function Level of Independence: Independent with assistive device(s) Comments: Pt has been walking short distances with a RW, but reports daily falls due to leg weakness.  Communication Communication: No difficulties Dominant Hand: Right         Vision/Perception     Cognition  Cognition Arousal/Alertness: Awake/alert Behavior During Therapy: WFL for tasks assessed/performed Overall Cognitive Status: No family/caregiver present to determine baseline cognitive functioning (not specifically tested)    Extremity/Trunk Assessment Upper Extremity Assessment Upper Extremity Assessment: Generalized weakness RUE Deficits / Details: RA in bil hands  Lower Extremity Assessment Lower Extremity Assessment: Generalized weakness RLE Deficits / Details: bil leg weakness, during supine testing right weaker than left, however during gait more left foot drag with fatigue than right.  Grossly 3+/5 right and 4/5 left leg during supine MMT.  LLE Deficits / Details: bil leg weakness, during supine testing right weaker than left, however during gait more left foot drag with fatigue than right.  Grossly 3+/5 right and 4/5 left leg during supine MMT.  Cervical / Trunk Assessment Cervical / Trunk Assessment:  Other exceptions Cervical / Trunk Exceptions: h/o low back surgery in Nov of 2014 (multi level fusion) and then again in Dec (due to wound infection).       Mobility Bed Mobility Overal bed mobility: Needs Assistance Bed Mobility: Rolling;Sidelying to Sit Rolling: Mod assist Sidelying to sit: Mod assist;HOB elevated General bed mobility comments: mod assist to support trunk during transitions, reinforced back precautions and log roll technique.   Transfers Overall transfer level: Needs assistance Equipment used: Rolling walker (2 wheeled) Sit to Stand: Min assist     Exercise     Balance Balance Overall balance assessment: Needs assistance Sitting balance-Leahy Scale: Good Standing balance-Leahy Scale: Poor   End of Session OT - End of Session Equipment Utilized During Treatment: Gait belt Activity Tolerance: Patient tolerated treatment well Patient left: in chair;with call bell/phone within reach Nurse Communication: Mobility status  GO     Jakevious Hollister,HILLARY 05/24/2013, 5:52 PM [facility], OTR/L  [phone] 05/24/2013

## 2013-05-24 NOTE — Progress Notes (Signed)
UR Completed Larenzo Caples Graves-Bigelow, RN,BSN [phone]  

## 2013-05-24 NOTE — Progress Notes (Signed)
INITIAL NUTRITION ASSESSMENT  DOCUMENTATION CODES Per approved criteria  -Severe malnutrition in the context of chronic illness   INTERVENTION:  Continue Ensure Complete PO TID, each supplement provides 350 kcal and 13 grams of protein  NUTRITION DIAGNOSIS: Malnutrition related to inadequate oral intake as evidenced by severe depletion of muscle mass and 19% weight loss in 3 months.   Goal: Intake to meet >90% of estimated nutrition needs.  Monitor:  PO intake, labs, weight trend.  Reason for Assessment: MD Consult for assessment of nutrition requirements/status  68 y.o. male  Admitting Dx: Fever of undetermined origin  ASSESSMENT: Patient is a 68 y.o. male with a Past Medical History of , recent back surgery complicated by a wound infection, malnutrition, diarrhea, laryngitis, multiple hospitalizations over the past few months was discharged almost 2 weeks back who presented to the ED on 2/15 with fever, nausea, and vomiting for 3-4 days. Upon discharge from this facility approximately 2 weeks back, patient has not been feeling well, for the past few days has gotten progressively weaker, he has not been ambulating and not eating well.   Patient with progressive weight loss over the past few months with recent back surgery and post-op wound infection. Patient reports that he has a very poor appetite and has been eating poorly for the past few months and minimally for the past few days.   Nutrition Focused Physical Exam:  Subcutaneous Fat:  Orbital Region: WNL Upper Arm Region: mild depletion Thoracic and Lumbar Region: NA  Muscle:  Temple Region: mild depletion Clavicle Bone Region: severe depletion Clavicle and Acromion Bone Region: severe depletion Scapular Bone Region: NA Dorsal Hand: mild depletion Patellar Region: moderate depletion Anterior Thigh Region: severe depletion Posterior Calf Region: moderate depletion  Edema: none  Pt meets criteria for severe  MALNUTRITION in the context of chronic illness as evidenced by severe depletion of muscle mass and 19% weight loss in 3 months.  Height: Ht Readings from Last 1 Encounters:  05/23/13 5' 6"  (1.676 m)    Weight: Wt Readings from Last 1 Encounters:  05/24/13 128 lb 14.4 oz (58.469 kg)    Ideal Body Weight: 64.5 kg  % Ideal Body Weight: 91%  Wt Readings from Last 10 Encounters:  05/24/13 128 lb 14.4 oz (58.469 kg)  05/18/13 130 lb (58.968 kg)  05/06/13 143 lb 4.8 oz (65 kg)  05/05/13 141 lb (63.957 kg)  05/01/13 144 lb (65.318 kg)  04/12/13 137 lb (62.143 kg)  04/12/13 138 lb 8 oz (62.823 kg)  03/16/13 158 lb 4.6 oz (71.8 kg)  03/16/13 158 lb 4.6 oz (71.8 kg)  03/02/13 158 lb 4.6 oz (71.8 kg)    Usual Body Weight: 158 lb  % Usual Body Weight: 81%  BMI:  Body mass index is 20.82 kg/(m^2).  Estimated Nutritional Needs: Kcal: 1800-2000 Protein: 100-110 gm Fluid: 1.8-2 L  Skin: back wound from recent back surgery  Diet Order: Cardiac with Ensure Complete TID  EDUCATION NEEDS: -Education needs addressed, discussed ways to increase protein and calorie intake.   Intake/Output Summary (Last 24 hours) at 05/24/13 0905 Last data filed at 05/24/13 0449  Gross per 24 hour  Intake      0 ml  Output    325 ml  Net   -325 ml    Last BM: 2/15   Labs:   Recent Labs Lab 05/23/13 1435 05/24/13 0530  NA 126* 124*  K 3.6* 3.4*  CL 86* 89*  CO2 20 21  BUN  13 11  CREATININE 0.51 0.43*  CALCIUM 8.8 7.9*  GLUCOSE 94 112*    CBG (last 3)   Recent Labs  05/23/13 2307 05/24/13 0824  GLUCAP 82 108*    Scheduled Meds: . aspirin EC  81 mg Oral q morning - 10a  . cholecalciferol  1,000 Units Oral Daily  . colchicine  0.6 mg Oral BID  . docusate sodium  100 mg Oral BID  . enoxaparin (LOVENOX) injection  40 mg Subcutaneous Q24H  . feeding supplement (ENSURE COMPLETE)  237 mL Oral TID WC  . FLUoxetine  40 mg Oral BID  . fluticasone  2 spray Each Nare QHS  .  gabapentin  300 mg Oral 2 times per day  . gabapentin  600 mg Oral 2 times per day  . imipenem-cilastatin  250 mg Intravenous Q6H  . losartan  50 mg Oral Daily  . oseltamivir  75 mg Oral BID  . OxyCODONE  10 mg Oral Q12H  . pantoprazole  40 mg Oral BID  . predniSONE  10 mg Oral QPM  . simvastatin  40 mg Oral Q lunch  . sodium chloride  3 mL Intravenous Q12H  . tiotropium  18 mcg Inhalation Daily  . vancomycin  750 mg Intravenous Q12H    Continuous Infusions: . sodium chloride 100 mL/hr at 05/23/13 2351    Past Medical History  Diagnosis Date  . Hx of colonic polyps   . Diverticulosis   . Hypertension   . Osteoarthritis   . 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 CAD (coronary artery disease)     minimal coronary plaque in the LAD and right coronary system. 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 7-06. Last nuclear stress 11-17-06 fixed anterior/ inferior defect, no inducible ischemia, EF 81%  . Allergic rhinitis   . Hx of colonoscopy   . RA (rheumatoid arthritis)   . COPD (chronic obstructive pulmonary disease)   . PONV (postoperative nausea and vomiting)   . Dysrhythmia 01-24-12    past hx. A.Fib x1 episode-responded to med.  . Depression   . Anxiety     pt. feels its related to the medicine that he takes   . GERD (gastroesophageal reflux disease)     Past Surgical History  Procedure Laterality Date  . Appendectomy    . Inguinal hernia repair  2008    Right, left remotely  . Total knee arthroplasty      right knee partial 2002  . Tonsillectomy    . Shoulder preplacement  2008    left, partial  . Ptca w/ coated stent lad      Cx secondary to tear  . Foot surgery  11-08    for removal of bone spurs- right foot  . Reconstruction of ankle  6-09    Right- [facility]  . L4-5 diskectomy w/ fusion, cage placement and rods  12-10    [place]  . Partial knee  arthroplasty      left  . Total shoulder replacement      right  . Joint replacement    . Cataract extraction, bilateral  01-24-12    Bilateral  . Orif shoulder fracture  02/06/2012    Procedure: OPEN REDUCTION INTERNAL FIXATION (ORIF) SHOULDER FRACTURE;  Surgeon: Nita Sells, MD;  Location: [facility];  Service: Orthopedics;  Laterality: Left;  ORIF of a  Left Shoulder Fracture with  Iliac Crest Bone Graft aspiration   . Harvest bone graft  02/06/2012    Procedure: HARVEST ILIAC BONE GRAFT;  Surgeon: Nita Sells, MD;  Location: [facility];  Service: Orthopedics;;  bone marrow aspirqation   . Hardware removal  03/09/2012    Procedure: HARDWARE REMOVAL;  Surgeon: Nita Sells, MD;  Location: [facility];  Service: Orthopedics;  Laterality: Left;  Hardware Removal from Left Shoulder  . Cardiac catheterization  2006    coronary stents- no problems  . Shoulder arthroscopy  2013  . Carpal tunnel release      R hand  . Ankle surgery Right 2008    at [facility], replacement   . Dg toes*r*      toe surgery- 07/2012  . Posterior lumbar fusion 4 level N/A 03/02/2013    Procedure: Lumbar One to Sacral One Posterior lumbar interbody fusion;  Surgeon: Floyce Stakes, MD;  Location: [facility];  Service: Neurosurgery;  Laterality: N/A;  L1 to S1 Posterior lumbar interbody fusion  . Lumbar wound debridement N/A 03/17/2013    Procedure: Incision and drainage of superficial lumbar wound;  Surgeon: Floyce Stakes, MD;  Location: [place] [facility];  Service: Neurosurgery;  Laterality: N/A;  Incision and drainage of superficial lumbar wound    Molli Barrows, RD, LDN, [place] Pager [phone] After Hours Pager [phone]

## 2013-05-25 ENCOUNTER — Inpatient Hospital Stay: Payer: Medicare Other | Admitting: Infectious Disease

## 2013-05-25 DIAGNOSIS — E43 Unspecified severe protein-calorie malnutrition: Secondary | ICD-10-CM | POA: Diagnosis present

## 2013-05-25 DIAGNOSIS — M961 Postlaminectomy syndrome, not elsewhere classified: Secondary | ICD-10-CM

## 2013-05-25 LAB — BASIC METABOLIC PANEL
BUN: 9 mg/dL (ref 6–23)
CO2: 22 mEq/L (ref 19–32)
Calcium: 7.9 mg/dL — ABNORMAL LOW (ref 8.4–10.5)
Chloride: 99 mEq/L (ref 96–112)
Creatinine, Ser: 0.47 mg/dL — ABNORMAL LOW (ref 0.50–1.35)
GFR calc Af Amer: >90 mL/min (ref >90)
GFR calc non Af Amer: >90 mL/min (ref >90)
Glucose, Bld: 103 mg/dL — ABNORMAL HIGH (ref 70–99)
Potassium: 3.4 mEq/L — ABNORMAL LOW (ref 3.7–5.3)
Sodium: 132 mEq/L — ABNORMAL LOW (ref 137–147)

## 2013-05-25 LAB — GLUCOSE, CAPILLARY
Glucose-Capillary: 102 mg/dL — ABNORMAL HIGH (ref 70–99)
Glucose-Capillary: 144 mg/dL — ABNORMAL HIGH (ref 70–99)
Glucose-Capillary: 123 mg/dL — ABNORMAL HIGH (ref 70–99)
Glucose-Capillary: 124 mg/dL — ABNORMAL HIGH (ref 70–99)

## 2013-05-25 LAB — PROCALCITONIN: Procalcitonin: <0.1 ng/mL

## 2013-05-25 NOTE — Progress Notes (Signed)
Patient ID: [name]  male  QMV:[id number]    DOB: May 19, 1945    DOA: 05/23/2013  PCP: Illene Regulus, MD  Interim summary  Patient is a 68 year old male with history of right arthritis, on chronic prednisone, status post recent back surgery, who was admitted on 05/23/1998 in with nausea, vomiting, poor appetite low-grade fevers. CT of the head showed no acute abnormalities. Chest x-ray was negative. Patient had mild hyponatremia of 126. He was placed on vancomycin and Primaxin broad-spectrum antibiotics. Flu test was negative and Tamiflu was discontinued.   Antibiotics Vancomycin  primaxin   Assessment/Plan:  . Hyponatremia- improving  - Secondary to dehydration and HCTZ therapy, patient was continued on gentle hydration - Serum osmolarity 263, consistent with hyper osmolar hyponatremia, urine osmolarity still pending, Una 84.    [name] SIRS (systemic inflammatory response syndrome) / FUO - Likely secondary to a unknown foci of infection-multiple sources of infection -Chest x-ray clear UA negative for UTI, pending influenza PCR.  - Has had recent back surgery, however the wound although open looks good without discharge or surrounding erythema.  -Cultures negative so far urine cultures only showed 25,000 colonies, continue vancomycin and imipenem for now Tamiflu discontinued. Will narrow antibiotics based on clinical course and culture results.    . Acute encephalopathy improving - Secondary to toxic metabolic encephalopathy- likely secondary to an infectious etiology. Although polypharmacy could be contributing as well-would decrease doses of narcotics, cautiously administer narcotics and benzodiazepines (holding parameters ordered)  - Await workup as noted above, empirically treat with antibiotics and see if better.   . Dehydration  - Will hydrate with IV fluids, reassess in a.m.   . HYPERTENSION  - Hold Lasix, HCTZ, cautiously continue with losartan.   . Severe Malnutrition    - Continue supplements  . PSEUDOGOUT  - No evidence of flare  . Rheumatoid arthritis  - On chronic prednisone 5 mg daily-giving acute illness, will double dose to 10 mg.   . Chronic narcotic use  - Cautiously continue-with holding parameters. See above.   [name] COPD (chronic obstructive pulmonary disease)  - Lungs clear  - As needed bronchodilators. Continues Spiriva   . frequent falls  - PT/OT eval   . Failure to thrive  - Nutritional eval, PT eval, needs skilled nursing facility placement or CIR on discharge.   DVT Prophylaxis:  Code Status:  Family Communication: Called the patient's daughter, Efraim Kaufmann at the bedside, phone number is [phone] unable to leave a message.   Disposition: likely CIR or SNF   Subjective: No specific complaints per the patient at this time, afebrile overnight  Objective: Weight change: -0.668 kg (-1 lb 7.6 oz)  Intake/Output Summary (Last 24 hours) at 05/25/13 1303 Last data filed at 05/24/13 2152  Gross per 24 hour  Intake      3 ml  Output   1200 ml  Net  -1197 ml   Blood pressure 132/84, pulse 70, temperature 98 F (36.7 C), temperature source Oral, resp. rate 18, height 5\' 6"  (1.676 m), weight 58.3 kg (128 lb 8.5 oz), SpO2 93.00%.  Physical Exam: General: Alert and awake, oriented x2 CVS: S1-S2 clear, no murmur rubs or gallops Chest: CTAB Abdomen: soft NT, ND, NBS Extremities: no c/c/e bilaterally Neuro: Cranial nerves II-XII intact, strength 4/ 5 in all 4 extremities  Lab Results: Basic Metabolic Panel:  Recent Labs Lab 05/24/13 0530 05/25/13 0749  NA 124* 132*  K 3.4* 3.4*  CL 89* 99  CO2 21 22  GLUCOSE 112* 103*  BUN 11 9  CREATININE 0.43* 0.47*  CALCIUM 7.9* 7.9*   Liver Function Tests:  Recent Labs Lab 05/23/13 1435  AST 28  ALT 13  ALKPHOS 85  BILITOT 1.0  PROT 7.2  ALBUMIN 3.2*    Recent Labs Lab 05/23/13 1435  LIPASE 67*   No results found for this basename: AMMONIA,  in the last 168  hours CBC:  Recent Labs Lab 05/23/13 1435 05/24/13 0530  WBC 11.2* 8.1  NEUTROABS 8.1*  --   HGB 13.3 11.8*  HCT 38.0* 34.2*  MCV 81.7 81.8  PLT 365 342   Cardiac Enzymes: No results found for this basename: CKTOTAL, CKMB, CKMBINDEX, TROPONINI,  in the last 168 hours BNP: No components found with this basename: POCBNP,  CBG:  Recent Labs Lab 05/24/13 1149 05/24/13 1700 05/24/13 2150 05/25/13 0746 05/25/13 1207  GLUCAP 190* 131* 114* 102* 144*     Micro Results: Recent Results (from the past 240 hour(s))  CULTURE, BLOOD (SINGLE)     Status: None   Collection Time    05/18/13  4:01 PM      Result Value Ref Range Status   Organism ID, Bacteria NO GROWTH 5 DAYS   Final  CULTURE, BLOOD (SINGLE)     Status: None   Collection Time    05/18/13  4:01 PM      Result Value Ref Range Status   Organism ID, Bacteria NO GROWTH 5 DAYS   Final  CULTURE, BLOOD (ROUTINE X 2)     Status: None   Collection Time    05/23/13  8:21 PM      Result Value Ref Range Status   Specimen Description BLOOD LEFT ARM   Final   Special Requests BOTTLES DRAWN AEROBIC AND ANAEROBIC 10CC EA   Final   Culture  Setup Time     Final   Value: 05/24/2013 01:43     Performed at [facility]   Culture     Final   Value:        BLOOD CULTURE RECEIVED NO GROWTH TO DATE CULTURE WILL BE HELD FOR 5 DAYS BEFORE ISSUING A FINAL NEGATIVE REPORT     Performed at [facility]   Report Status PENDING   Incomplete  CULTURE, BLOOD (ROUTINE X 2)     Status: None   Collection Time    05/23/13  8:26 PM      Result Value Ref Range Status   Specimen Description BLOOD LEFT HAND   Final   Special Requests BOTTLES DRAWN AEROBIC ONLY 10CC   Final   Culture  Setup Time     Final   Value: 05/24/2013 01:43     Performed at [facility]   Culture     Final   Value:        BLOOD CULTURE RECEIVED NO GROWTH TO DATE CULTURE WILL BE HELD FOR 5 DAYS BEFORE ISSUING A FINAL NEGATIVE REPORT     Performed  at [facility]   Report Status PENDING   Incomplete  URINE CULTURE     Status: None   Collection Time    05/23/13  8:43 PM      Result Value Ref Range Status   Specimen Description URINE, RANDOM   Final   Special Requests NONE   Final   Culture  Setup Time     Final   Value: 05/23/2013 21:44     Performed at [facility]   Colony Count     Final   Value: 25,000 COLONIES/ML     Performed at [facility]     Final   Value: Multiple bacterial morphotypes present, none predominant. Suggest appropriate recollection if clinically indicated.     Performed at [facility]   Report Status 05/24/2013 FINAL   Final    Studies/Results: Dg Chest 2 View  05/08/2013   CLINICAL DATA:  Cough, sudden onset congestion  EXAM: CHEST  2 VIEW  COMPARISON:  DG CHEST 1V PORT dated 05/06/2013; DG CHEST 1V PORT dated 03/22/2013  FINDINGS: There is elevation the right diaphragm. There is no focal parenchymal opacity, pleural effusion, or pneumothorax. The heart and mediastinal contours are unremarkable.  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 There are degenerative changes of the right acromioclavicular joint. There is as a chronic injury of the left acromioclavicular joint.  IMPRESSION: No active cardiopulmonary disease.   Electronically Signed   By: Elige Ko   On: 05/08/2013 13:16   Ct Head Wo Contrast  05/06/2013   CLINICAL DATA:  Altered mental status.  Patient fell.  EXAM: CT HEAD WITHOUT CONTRAST  TECHNIQUE: Contiguous axial images were obtained from the base of the skull through the vertex without intravenous contrast.  COMPARISON:  CT scan dated 07/14/2012  FINDINGS: No mass lesion. No midline shift. No acute hemorrhage or hematoma. No extra-axial fluid collections. No evidence of acute infarction. Brain parenchyma is normal except for mild cerebral cortical atrophy. No osseous abnormality.  IMPRESSION: No acute abnormality.  No change since the prior exam.    Electronically Signed   By: Geanie Cooley M.D.   On: 05/06/2013 14:21   Ct Cervical Spine Wo Contrast  05/06/2013   CLINICAL DATA:  Altered mental status after a fall.  EXAM: CT CERVICAL SPINE WITHOUT CONTRAST  TECHNIQUE: Multidetector CT imaging of the cervical spine was performed without intravenous contrast. Multiplanar CT image reconstructions were also generated.  COMPARISON:  None.  FINDINGS: There is no acute fracture.  No prevertebral soft tissue swelling.  The patient has severe degenerative disc disease at C2-3 with a 2 mm retrolisthesis of C2 on C3. There is a 3 mm anterolisthesis of C4 on C5 due to moderate left facet arthritis. This is not felt to be acute.  There is fairly severe degenerative disc disease at C5-6 and C6-7. There is a 2 mm anterolisthesis of C7 on T1 due to severe bilateral facet arthritis.  There is fairly severe right facet arthritis at C2-3 and C3-4.  The study is degraded by motion artifact but I feel that it is diagnostic.  IMPRESSION: No acute abnormality of the cervical spine. Multilevel degenerative disc and joint disease. Identified subluxations are felt to be chronic.   Electronically Signed   By: Geanie Cooley M.D.   On: 05/06/2013 14:26   Dg Chest Port 1 View  05/23/2013   CLINICAL DATA:  Nausea and shortness of breath.  EXAM: PORTABLE CHEST - 1 VIEW  COMPARISON:  PA and lateral chest 05/08/2013 in 07/14/2012.  FINDINGS: Marked elevation of the right hemidiaphragm relative to the left is unchanged. Lungs are clear. Heart size is normal. No pneumothorax or pleural effusion. Bilateral shoulder replacements noted.  IMPRESSION: No acute disease.  Stable compared to prior exam.   Electronically Signed   By: Drusilla Kanner M.D.   On: 05/23/2013 19:09   Dg Chest Port 1 View  05/06/2013   CLINICAL DATA:  Shortness  of breath, week  EXAM: PORTABLE CHEST - 1 VIEW  COMPARISON:  04/12/2013  FINDINGS: Very limited inspiratory effect. Cannot evaluate heart size as a result. The  vascular pattern appears normal. Right lung is clear. No abnormal opacities on the left. Stable scoliotic curvature thoracolumbar spine.  IMPRESSION: Study limited by very limited inspiratory effect. Grossly negative for acute abnormalities.   Electronically Signed   By: Esperanza Heir M.D.   On: 05/06/2013 13:43    Medications: Scheduled Meds: . aspirin EC  81 mg Oral q morning - 10a  . cholecalciferol  1,000 Units Oral Daily  . colchicine  0.6 mg Oral BID  . docusate sodium  100 mg Oral BID  . enoxaparin (LOVENOX) injection  40 mg Subcutaneous Q24H  . feeding supplement (ENSURE COMPLETE)  237 mL Oral TID WC  . FLUoxetine  40 mg Oral BID  . fluticasone  2 spray Each Nare QHS  . gabapentin  300 mg Oral 2 times per day  . gabapentin  600 mg Oral 2 times per day  . imipenem-cilastatin  250 mg Intravenous Q6H  . losartan  50 mg Oral Daily  . metoprolol tartrate  25 mg Oral BID  . OxyCODONE  10 mg Oral Q12H  . pantoprazole  40 mg Oral BID  . predniSONE  10 mg Oral QPM  . simvastatin  40 mg Oral Q lunch  . sodium chloride  3 mL Intravenous Q12H  . tiotropium  18 mcg Inhalation Daily  . vancomycin  750 mg Intravenous Q12H      LOS: 2 days   Kirat Mezquita M.D. [facility] 05/25/2013, 1:03 PM Pager: [phone]  If 7PM-7AM, please contact night-coverage [email or web address] Password [email or web address]

## 2013-05-25 NOTE — Consult Note (Signed)
Physical Medicine and Rehabilitation Consult Reason for Consult: Sepsis/acute cephalopathy Referring Physician: [facility]ad   HPI: [name] is a 68 y.o. right-handed male with history of rheumatoid arthritis with chronic prednisone, lumbar L4-5 fusion 2010. Admitted 03/02/2013 underwent L1-S1 decompression and fusion with course complicated by hypotension requiring pressor support. He was discharged to skilled nursing facility 03/10/2013 and returned to [facility] 03/16/2013 with fever 102 and wound drainage. On 03/17/2013 he returned to the operating room for debridement of his wound. A wound swab grew P aeruginosa (pan sens). He was discharged home with assistance from his wife. Patient presented 05/23/2013 with nausea vomiting poor appetite low-grade fever. He had been using a wheelchair rolling walker for short distances while at home. Will recent CT of the head showed no acute abnormalities. Chest x-ray negative. Urine culture 25,000 the species. Mild hyponatremia 126. Presently maintained on broad-spectrum antibiotics with Primaxin as well as vancomycin. Subcutaneous Lovenox added for DVT prophylaxis. Physical therapy evaluation 05/24/2013 and OT evaluation 05/25/2013 noted deconditioning and recommendations of physical medicine rehabilitation consult  Patient with vocal hoarseness but no evidence of obvious vocal cord paralysis after ENT evaluation   Review of Systems  Respiratory: Positive for shortness of breath.   Gastrointestinal:       GERD  Musculoskeletal: Positive for back pain, joint pain and myalgias.  Psychiatric/Behavioral: Positive for depression and memory loss.       Anxiety  All other systems reviewed and are negative.   Past Medical History  Diagnosis Date  . Hx of colonic polyps   . Diverticulosis   . Hypertension   . 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 CAD (coronary artery disease)     minimal coronary  plaque in the LAD and right coronary system. 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 7-06. Last nuclear stress 11-17-06 fixed anterior/ inferior defect, no inducible ischemia, EF 81%  . Allergic rhinitis   . Hx of colonoscopy   . PONV (postoperative nausea and vomiting)   . Dysrhythmia 01-24-12    past hx. A.Fib x1 episode-responded to med.  . Depression   . Anxiety     pt. feels its related to the medicine that he takes   . GERD (gastroesophageal reflux disease)   . COPD (chronic obstructive pulmonary disease)     pt denies this hx on 05/24/2013  . Osteoarthritis   . RA (rheumatoid arthritis)   . Chronic back pain    Past Surgical History  Procedure Laterality Date  . Appendectomy    . Total knee arthroplasty Right 2002     partial  . Shoulder hemi-arthroplasty Left 2008    partial  . Foot surgery Right 11-08    for removal of bone spurs-  . Ankle reconstruction Right 6-09    [facility]  . Posterior lumbar fusion  12-10    L4-5 diskectomy w/ fusion, cage placement and rods; [place]  . Partial knee arthroplasty Left   . Total shoulder replacement Right   . Joint replacement    . Cataract extraction, bilateral Bilateral 01-24-12  . Orif shoulder fracture  02/06/2012    Procedure: OPEN REDUCTION INTERNAL FIXATION (ORIF) SHOULDER FRACTURE;  Surgeon: Nita Sells, MD;  Location: [facility];  Service: Orthopedics;  Laterality: Left;  ORIF of a Left Shoulder Fracture with  Iliac Crest Bone Graft aspiration   .  Harvest bone graft  02/06/2012    Procedure: HARVEST ILIAC BONE GRAFT;  Surgeon: Nita Sells, MD;  Location: [facility];  Service: Orthopedics;;  bone marrow aspirqation   . Hardware removal  03/09/2012    Procedure: HARDWARE REMOVAL;  Surgeon: Nita Sells, MD;  Location: [place];  Service: Orthopedics;  Laterality: Left;  Hardware Removal from Left Shoulder  . Shoulder  arthroscopy Right 2013  . Carpal tunnel release Right 1990's  . Total ankle replacement Right 2008    at [facility]  . Hammer toe surgery Right 07/2012    "broke 4 hammertoes"   . Posterior lumbar fusion 4 level N/A 03/02/2013    Procedure: Lumbar One to Sacral One Posterior lumbar interbody fusion;  Surgeon: Floyce Stakes, MD;  Location: [place] [facility];  Service: Neurosurgery;  Laterality: N/A;  L1 to S1 Posterior lumbar interbody fusion  . Lumbar wound debridement N/A 03/17/2013    Procedure: Incision and drainage of superficial lumbar wound;  Surgeon: Floyce Stakes, MD;  Location: [place] [facility];  Service: Neurosurgery;  Laterality: N/A;  Incision and drainage of superficial lumbar wound  . Tonsillectomy    . Hernia repair Bilateral   . Coronary angioplasty with stent placement  2006    "while repairing 1st stent, a second area tore and they had to place 2nd stent " ?LAD & CX   Family History  Problem Relation Age of Onset  . Uterine cancer Mother     survivor  . Macular degeneration Mother   . Other Mother     ankle edema  . Lung cancer Mother   . Cancer Mother     ovarian, lung  . Coronary artery disease Father   . Hypertension Father   . Prostate cancer Father   . Colon polyps Father   . Cancer Father     prostate  . Thyroid disease Sister   . Heart attack Brother   . Hyperlipidemia Brother   . Other Brother     Schizophrenic  . Diabetes Neg Hx   . Colon cancer Neg Hx   . Esophageal cancer Neg Hx   . Rectal cancer Neg Hx   . Stomach cancer Neg Hx   . Coronary artery disease Maternal Aunt   . Heart attack Maternal Aunt   . Rheum arthritis Sister   . Hemochromatosis Sister    Social History:  reports that he has never smoked. He has never used smokeless tobacco. He reports that he does not drink alcohol or use illicit drugs. Allergies:  Allergies  Allergen Reactions  . Morphine And Related Shortness Of Breath, Nausea And Vomiting, Swelling and Other (See Comments)     Agitation, tolerates dilaudid  . Penicillins Hives, Shortness Of Breath and Rash    REACTION: hives, breathing problems  . Cefixime     rash  . Percocet [Oxycodone-Acetaminophen] Anxiety    Takes OxyContin at home.   Medications Prior to Admission  Medication Sig Dispense Refill  . Aclidinium Bromide (TUDORZA PRESSAIR) 400 MCG/ACT AEPB Inhale 1 puff into the lungs 2 (two) times daily.  1 each  11  . Aclidinium Bromide 400 MCG/ACT AEPB Inhale 2 puffs into the lungs at bedtime.      . ALPRAZolam (XANAX) 0.5 MG tablet Take 1 tablet (0.5 mg total) by mouth 3 (three) times daily as needed for anxiety or sleep.  90 tablet  5  . aspirin (ASPIRIN EC) 81 MG EC tablet Take 81 mg  by mouth every morning.       . celecoxib (CELEBREX) 200 MG capsule Take 200 mg by mouth 2 (two) times daily.      . cholecalciferol (VITAMIN D) 1000 UNITS tablet Take 1 tablet (1,000 Units total) by mouth daily.  100 tablet  3  . colchicine 0.6 MG tablet Take 0.6 mg by mouth 2 (two) times daily.      [name] docusate sodium (COLACE) 100 MG capsule Take 1 capsule (100 mg total) by mouth 2 (two) times daily.  60 capsule  0  . feeding supplement, ENSURE COMPLETE, (ENSURE COMPLETE) LIQD Take 237 mLs by mouth daily.  237 Bottle  1  . FLUoxetine (PROZAC) 40 MG capsule Take 40 mg by mouth 2 (two) times daily.      . fluticasone (FLONASE) 50 MCG/ACT nasal spray Place 2 sprays into both nostrils at bedtime.       . furosemide (LASIX) 20 MG tablet Take 20 mg by mouth daily.      [name] gabapentin (NEURONTIN) 300 MG capsule Take 300-600 mg by mouth 4 (four) times daily. 600 mg every morning, 300 mg at noon, 600 mg at bedtime, and 300 mg at 1 AM      . hydrochlorothiazide (HYDRODIURIL) 25 MG tablet Take 25 mg by mouth daily before breakfast.       . HYDROmorphone (DILAUDID) 4 MG tablet Take 1 tablet (4 mg total) by mouth every 8 (eight) hours as needed for moderate pain or severe pain. MAY FILL ON OR AFTER 07/16/13  90 tablet  0  . losartan (COZAAR)  50 MG tablet Take 50 mg by mouth daily.      . Melatonin ([employer] MELATONIN) 5 MG TABS Take 5 mg by mouth at bedtime.       . Nutritional Supplements (ENSURE COMPLETE PO) Take 0.5 Cans by mouth 2 (two) times daily.      . OxyCODONE (OXYCONTIN) 10 mg T12A 12 hr tablet Take 10 mg by mouth every 12 (twelve) hours.      . OxyCODONE (OXYCONTIN) 20 mg T12A 12 hr tablet Take 20 mg by mouth every 12 (twelve) hours.      . OxyCODONE (OXYCONTIN) 40 mg T12A 12 hr tablet Take 1 tablet (40 mg total) by mouth 2 (two) times daily as needed (pain). MAY FILL ON OR AFTER 07/16/2013  60 tablet  0  . pantoprazole (PROTONIX) 40 MG tablet Take 40 mg by mouth 2 (two) times daily.       Vladimir Faster Glycol-Propyl Glycol (SYSTANE ULTRA) 0.4-0.3 % SOLN Place 2 drops into both eyes 2 (two) times daily as needed (dry eyes).       . polyethylene glycol (MIRALAX / GLYCOLAX) packet Take 17 g by mouth daily as needed for moderate constipation.  14 each  1  . potassium chloride SA (K-DUR,KLOR-CON) 20 MEQ tablet Take 20 mEq by mouth daily.      . predniSONE (DELTASONE) 5 MG tablet Take 5 mg by mouth every evening.       . simvastatin (ZOCOR) 40 MG tablet Take 40 mg by mouth daily with lunch.       . traZODone (DESYREL) 50 MG tablet Take 50 mg by mouth at bedtime as needed for sleep (take if the 100 mg dose is not effective.).        Home: Home Living Family/patient expects to be discharged to:: Private residence Living Arrangements: Spouse/significant other (wife works for hospice part time 20hours per week) Available  Help at Discharge: Family;Available PRN/intermittently Type of Home: House Home Access: Stairs to enter [employer] of Steps: 5 Entrance Stairs-Rails: Right;Left Home Layout: Two level;Able to live on main level with bedroom/bathroom Home Equipment: Gilford Rile - 2 wheels;Cane - single point;Shower seat;Hand held shower head;Wheelchair - manual;Hospital bed  Lives With: Significant other  Functional  History: Prior Function Comments: Pt has been walking short distances with a RW, but reports daily falls due to leg weakness.  Functional Status:  Mobility:     Ambulation/Gait Ambulation Distance (Feet): 150 Feet Gait velocity: decreased General Gait Details: Pt started as a min assist to support trunk over weak leg (flexed bil in stance during gait), but increased to mod assist to support trunk as pt fatigued the second half of the walk.  Also, decreased bil L>R foot clearance and progression as he fatigued with gait.      ADL: ADL Eating/Feeding: Independent Where Assessed - Eating/Feeding: Chair Grooming: Set up Where Assessed - Grooming: Supported sitting Upper Body Bathing: Minimal assistance Where Assessed - Upper Body Bathing: Supported sitting Lower Body Bathing: Maximal assistance Where Assessed - Lower Body Bathing: Supported sit to stand Upper Body Dressing: Minimal assistance Where Assessed - Upper Body Dressing: Supported sitting Lower Body Dressing: Maximal assistance Where Assessed - Lower Body Dressing: Supported sit to Lobbyist: Minimal Print production planner Method: Sit to stand;Stand pivot Science writer: Bedside commode Equipment Used: Gait belt Transfers/Ambulation Related to ADLs: minA ADL Comments: easily fatigues  Cognition: Cognition Overall Cognitive Status: No family/caregiver present to determine baseline cognitive functioning (not specifically tested) Orientation Level: Oriented X4 Cognition Arousal/Alertness: Awake/alert Behavior During Therapy: WFL for tasks assessed/performed Overall Cognitive Status: No family/caregiver present to determine baseline cognitive functioning (not specifically tested)  Blood pressure 132/84, pulse 70, temperature 98 F (36.7 C), temperature source Oral, resp. rate 18, height 5' 6"  (1.676 m), weight 58.3 kg (128 lb 8.5 oz), SpO2 93.00%. Physical Exam  Vitals reviewed. HENT:  Head:  Normocephalic.  Eyes: EOM are normal.  Neck: Normal range of motion. Neck supple. No thyromegaly present.  Cardiovascular: Normal rate and regular rhythm.   Respiratory: Effort normal and breath sounds normal. No respiratory distress.  GI: Soft. Bowel sounds are normal. He exhibits no distension.  Neurological: He is alert.  Patient was able to state his name and age. He was a poor medical historian  Skin:  Back incision is clean and dry   granulation tissue noted, lumbar incision approximately 1 cm deep without drainage Motor strength is 4/5 bilateral deltoid, bicep, tricep, grip, hip flexor, knee extensors, ankle dorsiflexor plantar flexion Sensory intact to light touch in bilateral upper and lower limb  Results for orders placed during the hospital encounter of 05/23/13 (from the past 24 hour(s))  GLUCOSE, CAPILLARY     Status: Abnormal   Collection Time    05/24/13 11:49 AM      Result Value Ref Range   Glucose-Capillary 190 (*) 70 - 99 mg/dL   Comment 1 Documented in Chart     Comment 2 Notify RN    SODIUM, URINE, RANDOM     Status: None   Collection Time    05/24/13 12:10 PM      Result Value Ref Range   Sodium, Ur 84    GLUCOSE, CAPILLARY     Status: Abnormal   Collection Time    05/24/13  5:00 PM      Result Value Ref Range   Glucose-Capillary 131 (*) 70 -  99 mg/dL   Comment 1 Documented in Chart     Comment 2 Notify RN    GLUCOSE, CAPILLARY     Status: Abnormal   Collection Time    05/24/13  9:50 PM      Result Value Ref Range   Glucose-Capillary 114 (*) 70 - 99 mg/dL  PROCALCITONIN     Status: None   Collection Time    05/25/13  4:54 AM      Result Value Ref Range   Procalcitonin <0.10    GLUCOSE, CAPILLARY     Status: Abnormal   Collection Time    05/25/13  7:46 AM      Result Value Ref Range   Glucose-Capillary 102 (*) 70 - 99 mg/dL   Comment 1 Documented in Chart     Comment 2 Notify RN    BASIC METABOLIC PANEL     Status: Abnormal   Collection Time     05/25/13  7:49 AM      Result Value Ref Range   Sodium 132 (*) 137 - 147 mEq/L   Potassium 3.4 (*) 3.7 - 5.3 mEq/L   Chloride 99  96 - 112 mEq/L   CO2 22  19 - 32 mEq/L   Glucose, Bld 103 (*) 70 - 99 mg/dL   BUN 9  6 - 23 mg/dL   Creatinine, Ser 0.47 (*) 0.50 - 1.35 mg/dL   Calcium 7.9 (*) 8.4 - 10.5 mg/dL   GFR calc non Af Amer >90  >90 mL/min   GFR calc Af Amer >90  >90 mL/min   Dg Chest Port 1 View  05/23/2013   CLINICAL DATA:  Nausea and shortness of breath.  EXAM: PORTABLE CHEST - 1 VIEW  COMPARISON:  PA and lateral chest 05/08/2013 in 07/14/2012.  FINDINGS: Marked elevation of the right hemidiaphragm relative to the left is unchanged. Lungs are clear. Heart size is normal. No pneumothorax or pleural effusion. Bilateral shoulder replacements noted.  IMPRESSION: No acute disease.  Stable compared to prior exam.   Electronically Signed   By: Inge Rise M.D.   On: 05/23/2013 19:09    Assessment/Plan: Diagnosis: Lumbar postlaminectomy syndrome with postoperative infection and deconditioning. 1. Does the need for close, 24 hr/day medical supervision in concert with the patient's rehab needs make it unreasonable for this patient to be served in a less intensive setting? Yes 2. Co-Morbidities requiring supervision/potential complications: Delirium question med related, vocal hoarseness Due to bowel management, safety, skin/wound care, disease management, medication administration, pain management and patient education, does the patient require 24 hr/day rehab nursing? Yes 3. Does the patient require coordinated care of a physician, rehab nurse, PT (1-2 hrs/day, 5 days/week), OT (1-2 hrs/day, 5 days/week) and SLP (0.51 hrs/day, 5 days/week) to address physical and functional deficits in the context of the above medical diagnosis(es)? Yes Addressing deficits in the following areas: balance, endurance, locomotion, strength, transferring, bowel/bladder control, bathing, dressing, feeding,  grooming, toileting, cognition and speech 4. Can the patient actively participate in an intensive therapy program of at least 3 hrs of therapy per day at least 5 days per week? Yes 5. The potential for patient to make measurable gains while on inpatient rehab is good 6. Anticipated functional outcomes upon discharge from inpatient rehab are supervision to modified independent with PT, supervision to modified independent with OT, improve vocal volume, improve recent memory with SLP. 7. Estimated rehab length of stay to reach the above functional goals is: 7-9 days 8. Does  the patient have adequate social supports to accommodate these discharge functional goals? Potentially 9. Anticipated D/C setting: Home 10. Anticipated post D/C treatments: Tolstoy therapy 11. Overall Rehab/Functional Prognosis: good  RECOMMENDATIONS: This patient's condition is appropriate for continued rehabilitative care in the following setting: CIR Patient has agreed to participate in recommended program. Yes Note that insurance prior authorization may be required for reimbursement for recommended care.  Comment: Try to minimize opioid pain medications    05/25/2013

## 2013-05-26 ENCOUNTER — Inpatient Hospital Stay (HOSPITAL_COMMUNITY): Payer: Medicare Other

## 2013-05-26 DIAGNOSIS — R627 Adult failure to thrive: Secondary | ICD-10-CM

## 2013-05-26 DIAGNOSIS — R634 Abnormal weight loss: Secondary | ICD-10-CM | POA: Diagnosis present

## 2013-05-26 DIAGNOSIS — F1110 Opioid abuse, uncomplicated: Secondary | ICD-10-CM

## 2013-05-26 DIAGNOSIS — R630 Anorexia: Secondary | ICD-10-CM

## 2013-05-26 DIAGNOSIS — M069 Rheumatoid arthritis, unspecified: Secondary | ICD-10-CM

## 2013-05-26 DIAGNOSIS — T8149XA Infection following a procedure, other surgical site, initial encounter: Secondary | ICD-10-CM | POA: Diagnosis present

## 2013-05-26 DIAGNOSIS — M479 Spondylosis, unspecified: Secondary | ICD-10-CM | POA: Diagnosis present

## 2013-05-26 DIAGNOSIS — D649 Anemia, unspecified: Secondary | ICD-10-CM | POA: Diagnosis present

## 2013-05-26 DIAGNOSIS — E46 Unspecified protein-calorie malnutrition: Secondary | ICD-10-CM

## 2013-05-26 DIAGNOSIS — M549 Dorsalgia, unspecified: Secondary | ICD-10-CM

## 2013-05-26 DIAGNOSIS — J069 Acute upper respiratory infection, unspecified: Secondary | ICD-10-CM

## 2013-05-26 LAB — SEDIMENTATION RATE: Sed Rate: 8 mm/hr (ref 0–16)

## 2013-05-26 LAB — BASIC METABOLIC PANEL
BUN: 7 mg/dL (ref 6–23)
CO2: 25 mEq/L (ref 19–32)
Calcium: 7.8 mg/dL — ABNORMAL LOW (ref 8.4–10.5)
Chloride: 98 mEq/L (ref 96–112)
Creatinine, Ser: 0.58 mg/dL (ref 0.50–1.35)
GFR calc Af Amer: >90 mL/min (ref >90)
GFR calc non Af Amer: >90 mL/min (ref >90)
Glucose, Bld: 91 mg/dL (ref 70–99)
Potassium: 3.6 mEq/L — ABNORMAL LOW (ref 3.7–5.3)
Sodium: 130 mEq/L — ABNORMAL LOW (ref 137–147)

## 2013-05-26 LAB — GLUCOSE, CAPILLARY
Glucose-Capillary: 85 mg/dL (ref 70–99)
Glucose-Capillary: 92 mg/dL (ref 70–99)
Glucose-Capillary: 124 mg/dL — ABNORMAL HIGH (ref 70–99)
Glucose-Capillary: 112 mg/dL — ABNORMAL HIGH (ref 70–99)

## 2013-05-26 LAB — C-REACTIVE PROTEIN: CRP: 0.9 mg/dL — ABNORMAL HIGH (ref <0.60)

## 2013-05-26 MED ORDER — SODIUM CHLORIDE 0.9 % IV SOLN
500.0000 mg | Freq: Three times a day (TID) | INTRAVENOUS | Status: DC
  Filled 2013-05-26 (×3): qty 500

## 2013-05-26 MED ORDER — GUAIFENESIN ER 600 MG PO TB12
1200.0000 mg | ORAL_TABLET | Freq: Two times a day (BID) | ORAL | Status: DC
  Administered 2013-05-26 – 2013-05-27 (×3): 1200 mg via ORAL
  Filled 2013-05-26 (×4): qty 2

## 2013-05-26 MED ORDER — PREDNISONE 5 MG PO TABS
5.0000 mg | ORAL_TABLET | Freq: Every evening | ORAL | Status: DC
  Administered 2013-05-26: 5 mg via ORAL
  Filled 2013-05-26 (×2): qty 1

## 2013-05-26 MED ORDER — POTASSIUM CHLORIDE CRYS ER 20 MEQ PO TBCR
40.0000 meq | EXTENDED_RELEASE_TABLET | Freq: Once | ORAL | Status: AC
  Administered 2013-05-26: 40 meq via ORAL
  Filled 2013-05-26: qty 2

## 2013-05-26 MED ORDER — TRAZODONE HCL 100 MG PO TABS
100.0000 mg | ORAL_TABLET | Freq: Every evening | ORAL | Status: DC | PRN
  Administered 2013-05-26: 100 mg via ORAL
  Filled 2013-05-26: qty 1

## 2013-05-26 NOTE — Progress Notes (Addendum)
ANTIBIOTIC CONSULT NOTE - FOLLOW UP  Pharmacy Consult for vancomycin, imipenem Indication: r/o sepsis  Allergies  Allergen Reactions  . Morphine And Related Shortness Of Breath, Nausea And Vomiting, Swelling and Other (See Comments)    Agitation, tolerates dilaudid  . Penicillins Hives, Shortness Of Breath and Rash    REACTION: hives, breathing problems  . Cefixime     rash  . Percocet [Oxycodone-Acetaminophen] Anxiety    Takes OxyContin at home.    Patient Measurements: Height: 5' 6"  (167.6 cm) Weight: 134 lb 3.2 oz (60.873 kg) IBW/kg (Calculated) : 63.8  Vital Signs: Temp: 98.2 F (36.8 C) (02/18 0524) Temp src: Oral (02/18 0524) BP: 136/73 mmHg (02/18 0524) Pulse Rate: 78 (02/18 0524) Intake/Output from previous day: 02/17 0701 - 02/18 0700 In: -  Out: 850 [Urine:850] Intake/Output from this shift:    Labs:  Recent Labs  05/23/13 1435 05/24/13 0530 05/25/13 0749 05/26/13 0625  WBC 11.2* 8.1  --   --   HGB 13.3 11.8*  --   --   PLT 365 342  --   --   CREATININE 0.51 0.43* 0.47* 0.58   Estimated Creatinine Clearance: 78.2 ml/min (by C-G formula based on Cr of 0.58). No results found for this basename: VANCOTROUGH, Corlis Leak, VANCORANDOM, GENTTROUGH, GENTPEAK, GENTRANDOM, TOBRATROUGH, TOBRAPEAK, TOBRARND, AMIKACINPEAK, AMIKACINTROU, AMIKACIN,  in the last 72 hours   Microbiology: Recent Results (from the past 720 hour(s))  CULTURE, BLOOD (SINGLE)     Status: None   Collection Time    05/18/13  4:01 PM      Result Value Ref Range Status   Organism ID, Bacteria NO GROWTH 5 DAYS   Final  CULTURE, BLOOD (SINGLE)     Status: None   Collection Time    05/18/13  4:01 PM      Result Value Ref Range Status   Organism ID, Bacteria NO GROWTH 5 DAYS   Final  CULTURE, BLOOD (ROUTINE X 2)     Status: None   Collection Time    05/23/13  8:21 PM      Result Value Ref Range Status   Specimen Description BLOOD LEFT ARM   Final   Special Requests BOTTLES DRAWN AEROBIC  AND ANAEROBIC 10CC EA   Final   Culture  Setup Time     Final   Value: 05/24/2013 01:43     Performed at [facility]   Culture     Final   Value:        BLOOD CULTURE RECEIVED NO GROWTH TO DATE CULTURE WILL BE HELD FOR 5 DAYS BEFORE ISSUING A FINAL NEGATIVE REPORT     Performed at [facility]   Report Status PENDING   Incomplete  CULTURE, BLOOD (ROUTINE X 2)     Status: None   Collection Time    05/23/13  8:26 PM      Result Value Ref Range Status   Specimen Description BLOOD LEFT HAND   Final   Special Requests BOTTLES DRAWN AEROBIC ONLY 10CC   Final   Culture  Setup Time     Final   Value: 05/24/2013 01:43     Performed at [facility]   Culture     Final   Value:        BLOOD CULTURE RECEIVED NO GROWTH TO DATE CULTURE WILL BE HELD FOR 5 DAYS BEFORE ISSUING A FINAL NEGATIVE REPORT     Performed at [facility]   Report Status PENDING  Incomplete  URINE CULTURE     Status: None   Collection Time    05/23/13  8:43 PM      Result Value Ref Range Status   Specimen Description URINE, RANDOM   Final   Special Requests NONE   Final   Culture  Setup Time     Final   Value: 05/23/2013 21:44     Performed at [facility] Count     Final   Value: 25,000 COLONIES/ML     Performed at [facility]   Culture     Final   Value: Multiple bacterial morphotypes present, none predominant. Suggest appropriate recollection if clinically indicated.     Performed at [facility]   Report Status 05/24/2013 FINAL   Final    Anti-infectives   Start     Dose/Rate Route Frequency Ordered Stop   05/24/13 1000  vancomycin (VANCOCIN) IVPB 750 mg/150 ml premix     750 mg 150 mL/hr over 60 Minutes Intravenous Every 12 hours 05/23/13 2324     05/24/13 0400  imipenem-cilastatin (PRIMAXIN) 250 mg in sodium chloride 0.9 % 100 mL IVPB     250 mg 200 mL/hr over 30 Minutes Intravenous Every 6 hours 05/23/13 2239     05/23/13 2245   oseltamivir (TAMIFLU) capsule 75 mg  Status:  Discontinued     75 mg Oral 2 times daily 05/23/13 2239 05/24/13 2041   05/23/13 1900  imipenem-cilastatin (PRIMAXIN) 250 mg in sodium chloride 0.9 % 100 mL IVPB     250 mg 200 mL/hr over 30 Minutes Intravenous  Once 05/23/13 1852 05/23/13 2143   05/23/13 1900  vancomycin (VANCOCIN) 1,250 mg in sodium chloride 0.9 % 250 mL IVPB     1,250 mg 166.7 mL/hr over 90 Minutes Intravenous  Once 05/23/13 1856 05/23/13 2340   05/23/13 1830  vancomycin (VANCOCIN) 1,180 mg in sodium chloride 0.9 % 250 mL IVPB  Status:  Discontinued     20 mg/kg  59 kg 166.7 mL/hr over 90 Minutes Intravenous  Once 05/23/13 1828 05/23/13 1856      Assessment: 68 yo male on vancomycin and zosyn for sepsis/FUO. Afebrile, SCr= 0.58, CrCl ~ 80 and blood cultures NGTD.   Cx 2/15 blood x2- ngtd 2/15 urine- multiple  Goal of Therapy:  Vancomycin trough level 15-20 mcg/ml  Plan:  -Will change imipenem to 580m IV q8h -Could consider d/c vancomycin? -Will consider a vancomycin trough if therapy continues -Will follow renal function, cultures and clinical progress  AHildred Laser Pharm D 05/26/2013 8:55 AM

## 2013-05-26 NOTE — Consult Note (Signed)
WOC wound consult note Reason for Consult: pt will known to [facility] nursing department from previous admissions.  Wound type: surgical wound Dr. Joya Salm (neurosurgery) 03/02/13 Measurement:4.0cm x 1.5cmx 0.5cm with tunnel distally that is 2.0cm  Wound bed: pale, pink, non granular but much improved since my partners last assessment 05/07/13 Drainage (amount, consistency, odor) serous, non foul, no odor Periwound: intact Dressing procedure/placement/frequency: silver hydrofiber, cover with dry dressing. Change M/W/F. Dr. Megan Salon ID at bedside to assess the wound also at the same time.    Discussed POC with patient and bedside nurse.  Re consult if needed, will not follow at this time. Thanks  Kyiesha Millward [place], [place] [phone])

## 2013-05-26 NOTE — Progress Notes (Signed)
Patient ID: [name]  male  FFM:[id number]    DOB: 09-28-1945    DOA: 05/23/2013  PCP: Adella Hare, MD  Subjective: No specific complaints, denies any fever or chills overnight. He has productive cough since yesterday.  Interim summary  Patient is a 68 year old male with history of right arthritis, on chronic prednisone, status post recent back surgery, who was admitted on 05/23/1998 in with nausea, vomiting, poor appetite low-grade fevers. CT of the head showed no acute abnormalities. Chest x-ray was negative. Patient had mild hyponatremia of 126. He was placed on vancomycin and Primaxin broad-spectrum antibiotics. Flu test was negative and Tamiflu was discontinued.   Antibiotics Vancomycin  primaxin   Assessment/Plan:  . Hyponatremia - Secondary to dehydration and HCTZ therapy, patient was continued on gentle hydration. - Serum osmolarity 263, consistent with hyper osmolar hyponatremia, urine osmolarity still pending, Una 84.  -This is seems to be acute on chronic problem, he has low or low normal sodium since 2013.  [name] SIRS (systemic inflammatory response syndrome) / FUO -Likely secondary to a unknown foci of infection-multiple sources of infection, patient is on chronic 5 mg of prednisone. -Chest x-ray clear UA negative for UTI, pending influenza PCR.  -Has had recent back surgery, however the wound although open looks good without discharge or surrounding erythema.  -Tamiflu discontinued because of negative flu PCR. -History of Pseudomonas UTI and pseudomonas isolated from surgical wound in the back.  Acute bronchitis -Patient mentioned that he developed symptoms of bronchitis just yesterday to 17 2015. -Prior to that his chest x-ray was clear, denies any cough or shortness of breath. -Patient is on Primaxin and vancomycin, continue antibiotics for now. -Added mucolytics and antitussives to his regimen.  -Doubt this is because of fever of admission, I spoke with Dr.  Megan Salon and he is going to see the patient.   . Acute encephalopathy improving - Secondary to toxic metabolic encephalopathy- likely secondary to an infectious etiology. Although polypharmacy could be contributing as well-would decrease doses of narcotics, cautiously administer narcotics and benzodiazepines (holding parameters ordered). -I do not think this is related to infection, decreased narcotics, benzos and gabapentin.  . Dehydration  - Will hydrate with IV fluids, reassess in a.m.   . HYPERTENSION  - Hold Lasix, HCTZ, cautiously continue with losartan.   . Severe Malnutrition   - Continue supplements  . PSEUDOGOUT  - No evidence of flare  . Rheumatoid arthritis  - On chronic prednisone 5 mg daily-giving acute illness, will double dose to 10 mg.   . Chronic narcotic use  - Cautiously continue-with holding parameters. See above.   [name] COPD (chronic obstructive pulmonary disease)  - Lungs clear  - As needed bronchodilators. Continues Spiriva   . frequent falls  - PT/OT eval   . Failure to thrive  - Nutritional eval, PT eval, needs skilled nursing facility placement or CIR on discharge.   DVT Prophylaxis:  Code Status:  Family Communication: Called the patient's daughter, Lenna Sciara at the bedside, phone number is [phone] unable to leave a message.   Disposition: likely CIR in AM    Objective: Weight change: 2.573 kg (5 lb 10.8 oz)  Intake/Output Summary (Last 24 hours) at 05/26/13 1325 Last data filed at 05/26/13 0453  Gross per 24 hour  Intake      0 ml  Output    850 ml  Net   -850 ml   Blood pressure 136/73, pulse 78, temperature 98.2 F (36.8 C), temperature source Oral,  resp. rate 18, height 5' 6"  (1.676 m), weight 60.873 kg (134 lb 3.2 oz), SpO2 97.00%.  Physical Exam: General: Alert and awake, oriented x2 CVS: S1-S2 clear, no murmur rubs or gallops Chest: CTAB Abdomen: soft NT, ND, NBS Extremities: no c/c/e bilaterally Neuro: Cranial nerves  II-XII intact, strength 4/ 5 in all 4 extremities  Lab Results: Basic Metabolic Panel:  Recent Labs Lab 05/25/13 0749 05/26/13 0625  NA 132* 130*  K 3.4* 3.6*  CL 99 98  CO2 22 25  GLUCOSE 103* 91  BUN 9 7  CREATININE 0.47* 0.58  CALCIUM 7.9* 7.8*   Liver Function Tests:  Recent Labs Lab 05/23/13 1435  AST 28  ALT 13  ALKPHOS 85  BILITOT 1.0  PROT 7.2  ALBUMIN 3.2*    Recent Labs Lab 05/23/13 1435  LIPASE 67*   No results found for this basename: AMMONIA,  in the last 168 hours CBC:  Recent Labs Lab 05/23/13 1435 05/24/13 0530  WBC 11.2* 8.1  NEUTROABS 8.1*  --   HGB 13.3 11.8*  HCT 38.0* 34.2*  MCV 81.7 81.8  PLT 365 342   Cardiac Enzymes: No results found for this basename: CKTOTAL, CKMB, CKMBINDEX, TROPONINI,  in the last 168 hours BNP: No components found with this basename: POCBNP,  CBG:  Recent Labs Lab 05/25/13 1207 05/25/13 1656 05/25/13 2022 05/26/13 0737 05/26/13 1059  GLUCAP 144* 123* 124* 85 92     Micro Results: Recent Results (from the past 240 hour(s))  CULTURE, BLOOD (SINGLE)     Status: None   Collection Time    05/18/13  4:01 PM      Result Value Ref Range Status   Organism ID, Bacteria NO GROWTH 5 DAYS   Final  CULTURE, BLOOD (SINGLE)     Status: None   Collection Time    05/18/13  4:01 PM      Result Value Ref Range Status   Organism ID, Bacteria NO GROWTH 5 DAYS   Final  CULTURE, BLOOD (ROUTINE X 2)     Status: None   Collection Time    05/23/13  8:21 PM      Result Value Ref Range Status   Specimen Description BLOOD LEFT ARM   Final   Special Requests BOTTLES DRAWN AEROBIC AND ANAEROBIC 10CC EA   Final   Culture  Setup Time     Final   Value: 05/24/2013 01:43     Performed at [facility]   Culture     Final   Value:        BLOOD CULTURE RECEIVED NO GROWTH TO DATE CULTURE WILL BE HELD FOR 5 DAYS BEFORE ISSUING A FINAL NEGATIVE REPORT     Performed at [facility]   Report Status PENDING    Incomplete  CULTURE, BLOOD (ROUTINE X 2)     Status: None   Collection Time    05/23/13  8:26 PM      Result Value Ref Range Status   Specimen Description BLOOD LEFT HAND   Final   Special Requests BOTTLES DRAWN AEROBIC ONLY 10CC   Final   Culture  Setup Time     Final   Value: 05/24/2013 01:43     Performed at [facility]   Culture     Final   Value:        BLOOD CULTURE RECEIVED NO GROWTH TO DATE CULTURE WILL BE HELD FOR 5 DAYS BEFORE ISSUING A FINAL NEGATIVE REPORT  Performed at [facility]   Report Status PENDING   Incomplete  URINE CULTURE     Status: None   Collection Time    05/23/13  8:43 PM      Result Value Ref Range Status   Specimen Description URINE, RANDOM   Final   Special Requests NONE   Final   Culture  Setup Time     Final   Value: 05/23/2013 21:44     Performed at [facility] Count     Final   Value: 25,000 COLONIES/ML     Performed at [facility]   Culture     Final   Value: Multiple bacterial morphotypes present, none predominant. Suggest appropriate recollection if clinically indicated.     Performed at [facility]   Report Status 05/24/2013 FINAL   Final    Studies/Results: Dg Chest 2 View  05/08/2013   CLINICAL DATA:  Cough, sudden onset congestion  EXAM: CHEST  2 VIEW  COMPARISON:  DG CHEST 1V PORT dated 05/06/2013; DG CHEST 1V PORT dated 03/22/2013  FINDINGS: There is elevation the right diaphragm. There is no focal parenchymal opacity, pleural effusion, or pneumothorax. The heart and mediastinal contours are unremarkable.  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 There are degenerative changes of the right acromioclavicular joint. There is as a chronic injury of the left acromioclavicular joint.  IMPRESSION: No active cardiopulmonary disease.   Electronically Signed   By: Kathreen Devoid   On: 05/08/2013 13:16   Ct Head Wo Contrast  05/06/2013   CLINICAL DATA:  Altered mental status.  Patient  fell.  EXAM: CT HEAD WITHOUT CONTRAST  TECHNIQUE: Contiguous axial images were obtained from the base of the skull through the vertex without intravenous contrast.  COMPARISON:  CT scan dated 07/14/2012  FINDINGS: No mass lesion. No midline shift. No acute hemorrhage or hematoma. No extra-axial fluid collections. No evidence of acute infarction. Brain parenchyma is normal except for mild cerebral cortical atrophy. No osseous abnormality.  IMPRESSION: No acute abnormality.  No change since the prior exam.   Electronically Signed   By: Rozetta Nunnery M.D.   On: 05/06/2013 14:21   Ct Cervical Spine Wo Contrast  05/06/2013   CLINICAL DATA:  Altered mental status after a fall.  EXAM: CT CERVICAL SPINE WITHOUT CONTRAST  TECHNIQUE: Multidetector CT imaging of the cervical spine was performed without intravenous contrast. Multiplanar CT image reconstructions were also generated.  COMPARISON:  None.  FINDINGS: There is no acute fracture.  No prevertebral soft tissue swelling.  The patient has severe degenerative disc disease at C2-3 with a 2 mm retrolisthesis of C2 on C3. There is a 3 mm anterolisthesis of C4 on C5 due to moderate left facet arthritis. This is not felt to be acute.  There is fairly severe degenerative disc disease at C5-6 and C6-7. There is a 2 mm anterolisthesis of C7 on T1 due to severe bilateral facet arthritis.  There is fairly severe right facet arthritis at C2-3 and C3-4.  The study is degraded by motion artifact but I feel that it is diagnostic.  IMPRESSION: No acute abnormality of the cervical spine. Multilevel degenerative disc and joint disease. Identified subluxations are felt to be chronic.   Electronically Signed   By: Rozetta Nunnery M.D.   On: 05/06/2013 14:26   Dg Chest Port 1 View  05/23/2013   CLINICAL DATA:  Nausea and shortness of breath.  EXAM: PORTABLE CHEST -  1 VIEW  COMPARISON:  PA and lateral chest 05/08/2013 in 07/14/2012.  FINDINGS: Marked elevation of the right hemidiaphragm  relative to the left is unchanged. Lungs are clear. Heart size is normal. No pneumothorax or pleural effusion. Bilateral shoulder replacements noted.  IMPRESSION: No acute disease.  Stable compared to prior exam.   Electronically Signed   By: Inge Rise M.D.   On: 05/23/2013 19:09   Dg Chest Port 1 View  05/06/2013   CLINICAL DATA:  Shortness of breath, week  EXAM: PORTABLE CHEST - 1 VIEW  COMPARISON:  04/12/2013  FINDINGS: Very limited inspiratory effect. Cannot evaluate heart size as a result. The vascular pattern appears normal. Right lung is clear. No abnormal opacities on the left. Stable scoliotic curvature thoracolumbar spine.  IMPRESSION: Study limited by very limited inspiratory effect. Grossly negative for acute abnormalities.   Electronically Signed   By: Skipper Cliche M.D.   On: 05/06/2013 13:43    Medications: Scheduled Meds: . aspirin EC  81 mg Oral q morning - 10a  . cholecalciferol  1,000 Units Oral Daily  . colchicine  0.6 mg Oral BID  . docusate sodium  100 mg Oral BID  . enoxaparin (LOVENOX) injection  40 mg Subcutaneous Q24H  . feeding supplement (ENSURE COMPLETE)  237 mL Oral TID WC  . FLUoxetine  40 mg Oral BID  . fluticasone  2 spray Each Nare QHS  . gabapentin  300 mg Oral 2 times per day  . gabapentin  600 mg Oral 2 times per day  . guaiFENesin  1,200 mg Oral BID  . imipenem-cilastatin  500 mg Intravenous Q8H  . losartan  50 mg Oral Daily  . metoprolol tartrate  25 mg Oral BID  . OxyCODONE  10 mg Oral Q12H  . pantoprazole  40 mg Oral BID  . potassium chloride  40 mEq Oral Once  . predniSONE  10 mg Oral QPM  . simvastatin  40 mg Oral Q lunch  . sodium chloride  3 mL Intravenous Q12H  . tiotropium  18 mcg Inhalation Daily  . vancomycin  750 mg Intravenous Q12H      LOS: 3 days   [facility] A M.D. [facility] 05/26/2013, 1:25 PM Pager: [phone]  If 7PM-7AM, please contact night-coverage [email or web address] Password [email or web address]

## 2013-05-26 NOTE — Progress Notes (Signed)
Occupational Therapy Treatment Patient Details Name: [name] MRN: [id number] DOB: [birth year] Today's Date: 05/26/2013 Time: 2725-3664 OT Time Calculation (min): 27 min  OT Assessment / Plan / Recommendation  History of present illness 68 y.o. male admitted to [facility] on 05/23/13 with Past Medical History of , recent back surgery complicated by a wound infection, malnutrition, diarrhea, laryngitis, multiple hospitalizations. He presents this admission with fever, N/V and weakness.  Dx with hyponatremia, SIRS, FTT, fever of unknown origin, dehydration and encephalopathy.  He is being r/o for flu as he reports his wife currently has the flu at home.  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     OT comments  Pt making good progress. Very motivated to participate with therapy. Feel pt will benefit from intensive rehab at CIR to max functional level of independence and decrease burden of care.   Follow Up Recommendations  CIR;Supervision/Assistance - 24 hour    Barriers to Discharge       Equipment Recommendations  None recommended by OT    Recommendations for Other Services Rehab consult  Frequency Min 3X/week   Progress towards OT Goals Progress towards OT goals: Progressing toward goals  Plan Discharge plan remains appropriate    Precautions / Restrictions Precautions Precautions: Back Precaution Comments: reviewed log roll technique for getting up OOB.     Pertinent Vitals/Pain no apparent distress     ADL  Lower Body Dressing: Moderate assistance Where Assessed - Lower Body Dressing: Supported sit to Pharmacist, hospital: Minimal assistance Toilet Transfer Method: Sit to stand;Stand pivot Acupuncturist: Comfort height toilet Toileting - Clothing Manipulation and Hygiene: Minimal assistance Where Assessed - Engineer, mining and Hygiene: Sit to stand from  3-in-1 or toilet Equipment Used: Gait belt;Rolling walker Transfers/Ambulation Related to ADLs: minA ADL Comments: easily fatigues; difficulty with donning slippers; 2 LOB during ADL  Pt has difficulty holding onto utensils. Will return with tubing and built up grip for pen.  OT Diagnosis:    OT Problem List:   OT Treatment Interventions:     OT Goals(current goals can now be found in the care plan section) Acute Rehab OT Goals Patient Stated Goal: to get intensive rehab OT Goal Formulation: With patient Time For Goal Achievement: 06/08/12 Potential to Achieve Goals: Good ADL Goals Pt Will Perform Grooming: with modified independence;standing Pt Will Perform Lower Body Bathing: with modified independence;with adaptive equipment;sit to/from stand Pt Will Perform Lower Body Dressing: with modified independence;with adaptive equipment;sit to/from stand Pt Will Transfer to Toilet: with modified independence;bedside commode;ambulating Pt Will Perform Toileting - Clothing Manipulation and hygiene: with modified independence;sit to/from stand Pt Will Perform Tub/Shower Transfer: Shower transfer;with modified independence;ambulating Additional ADL Goal #1: Complete theraband HEP independently for UB strengthening. - level 2  Visit Information  Last OT Received On: 05/26/13 Assistance Needed: +1 History of Present Illness: 68 y.o. male admitted to [facility] on 05/23/13 with Past Medical History of , recent back surgery complicated by a wound infection, malnutrition, diarrhea, laryngitis, multiple hospitalizations. He presents this admission with fever, N/V and weakness.  Dx with hyponatremia, SIRS, FTT, fever of unknown origin, dehydration and encephalopathy.  He is being r/o for flu as he reports his wife currently has the flu at home.  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      Subjective Data  Prior  Functioning       Cognition  Cognition Arousal/Alertness: Awake/alert Behavior During Therapy: WFL for tasks assessed/performed Overall Cognitive Status: Within Functional Limits for tasks assessed    Mobility  Bed Mobility Overal bed mobility: Needs Assistance Bed Mobility: Rolling;Sidelying to Sit Rolling: Supervision Sidelying to sit: HOB elevated Transfers Overall transfer level: Needs assistance Transfers: Sit to/from Stand Sit to Stand: Min assist Stand pivot transfers: Supervision    Exercises      Balance Balance Overall balance assessment: Needs assistance Standing balance support: Bilateral upper extremity supported Standing balance-Leahy Scale: Fair  End of Session    GO     [name],[name] 05/26/2013, 3:24 PM [facility], OTR/L  [phone] 05/26/2013

## 2013-05-26 NOTE — Progress Notes (Signed)
Inpt. Rehab  I just received insurance approval and we will be able to admit tomorrow.  If any questions arise tomorrow, pleased contact Danne Baxter in my absence.    [place] Admissions Coordinator Cell [phone] Office [phone]

## 2013-05-26 NOTE — Progress Notes (Signed)
Physical Therapy Treatment Patient Details Name: [name] MRN: [id number] DOB: [birth year] Today's Date: 05/26/2013 Time: 4098-1191 PT Time Calculation (min): 37 min  PT Assessment / Plan / Recommendation  History of Present Illness 68 y.o. male admitted to [facility] on 05/23/13 with Past Medical History of , recent back surgery complicated by a wound infection, malnutrition, diarrhea, laryngitis, multiple hospitalizations. He presents this admission with fever, N/V and weakness.  Dx with hyponatremia, SIRS, FTT, fever of unknown origin, dehydration and encephalopathy.  He is being r/o for flu as he reports his wife currently has the flu at home.  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     PT Comments   Pt is progressing with his mobility, able to tolerate a longer session today with increased activity tolerance.  He continues to need min assist for balance during functional dynamic tasks even with one hand, and especially with no hands supported.  He presents as a high fall risk during gait, especially as he fatigues and his left leg starts to show signs of fatigue (decreased forward progression of leg/step length and decreased foot/ankle clearance/toe drag).  He continues to be an excellent candidate for CIR level therapies and is showing increased tolerance of a longer session today.  He is highly motivated to get better and to be as independent as possible before returning home with his wife who works part time.    Follow Up Recommendations  CIR     Does the patient have the potential to tolerate intense rehabilitation    Yes  Barriers to Discharge   None      Equipment Recommendations  None recommended by PT    Recommendations for Other Services Rehab consult  Frequency Min 3X/week   Progress towards PT Goals Progress towards PT goals: Progressing toward goals  Plan Current plan remains  appropriate    Precautions / Restrictions Precautions Precautions: Back Precaution Comments: reviewed log roll technique for getting up OOB.     Pertinent Vitals/Pain See vitals flow sheet.    Mobility  Bed Mobility Overal bed mobility: Needs Assistance Bed Mobility: Rolling;Sidelying to Sit Rolling: Min assist Sidelying to sit: Min assist General bed mobility comments: Min assist to support trunk to roll to side.  Pt using bed rail for leverage.  Min assist to support trunk during transition to sit.  HOB flat, but again, pt relying heavily on bed rail for transitions.  Transfers Overall transfer level: Needs assistance Equipment used: Rolling walker (2 wheeled) Transfers: Sit to/from Stand Sit to Stand: Min assist General transfer comment: Min assist to support trunk during transitions from various surfaces (bed, toilet) with verbal cues for safe hand placement and assist to anteriorly weight shift body and prevent uncontrolled descent to sit.   Ambulation/Gait Ambulation/Gait assistance: Min assist Ambulation Distance (Feet): 200 Feet Assistive device: Rolling walker (2 wheeled) Gait Pattern/deviations: Step-through pattern Gait velocity: decreased Gait velocity interpretation: Below normal speed for age/gender General Gait Details: Pt continues to have increased left leg weakness with increased gait distance and fatigue.  Min assist to support trunk for balance during gait, especially when turning or navigating tighter obstables.  Verbal cues for safe use of RW and upright posture.     Exercises General Exercises - Lower Extremity Long Arc Quad: AROM;Both;10 reps;Seated Hip ABduction/ADduction: AROM;Both;10 reps;Seated (adduct against pillow for resistance) Hip Flexion/Marching: AROM;Both;10 reps;Seated Toe Raises: AROM;Both;10 reps;Seated Heel Raises: AROM;Both;10 reps;Seated Other  Exercises Other Exercises: HEP paper given of exercises above with instructions to preform 10  times TID.      PT Goals (current goals can now be found in the care plan section) Acute Rehab PT Goals Patient Stated Goal: to get intensive rehab  Visit Information  Last PT Received On: 05/26/13 Assistance Needed: +1 History of Present Illness: 68 y.o. male admitted to [facility] on 05/23/13 with Past Medical History of , recent back surgery complicated by a wound infection, malnutrition, diarrhea, laryngitis, multiple hospitalizations. He presents this admission with fever, N/V and weakness.  Dx with hyponatremia, SIRS, FTT, fever of unknown origin, dehydration and encephalopathy.  He is being r/o for flu as he reports his wife currently has the flu at home.  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      Subjective Data  Subjective: Pt reports that his chest feels more conjested today and he has been coughing for the past hour.  Patient Stated Goal: to get intensive rehab   Cognition  Cognition Arousal/Alertness: Awake/alert Behavior During Therapy: WFL for tasks assessed/performed Overall Cognitive Status: Within Functional Limits for tasks assessed    Balance  Balance Overall balance assessment: Needs assistance Sitting-balance support: Bilateral upper extremity supported;Feet supported Sitting balance-Leahy Scale: Good Standing balance support: Bilateral upper extremity supported;No upper extremity supported;During functional activity Standing balance-Leahy Scale: Poor Standing balance comment: min assist while preforming pericare after toileting for balance even with one upper extremity supported.  Again, LOB is posteriorly and pt with very sway back posture in standing with decreased upper extremity support.  Also, min assist for balance at the sink with no upper extremities supported.  Pt leaning his trunk against the sink for increased stability.    End of Session PT - End of Session Equipment  Utilized During Treatment: Gait belt Activity Tolerance: Patient limited by fatigue Patient left: in chair;with call bell/phone within reach;with family/visitor present    Wells Guiles B. [place], [place], DPT [phone]   05/26/2013, 10:57 AM

## 2013-05-26 NOTE — PMR Pre-admission (Signed)
PMR Admission Coordinator Pre-Admission Assessment  Patient: [name] is an 68 y.o., male MRN: [id number] DOB: 02/26/1946 Height: 5' 6"  (167.6 cm) Weight: 61.75 kg (136 lb 2.1 oz)              Insurance Information HMO: yes    PPO:      PCP:      IPA:      80/20:      OTHER: Medicare advantage plan PRIMARY: Blue Medicare      Policy#: [id number]      Subscriber: pt CM Name: Shelly Coss      Phone#: [phone]     Fax#: [phone] Pre-Cert#: [id number]   Employer: self auth for 7 days 2/26 Benefits:  Phone #: [phone]     Name: 2/19 Eff. Date: 04/08/13     Deduct: none      Out of Pocket Max: $4500      Life Max: none CIR: $250 per day days 1 -6, then covered 100%      SNF: no copay days  1- 20, $60 per day days 21-100 Outpatient: $35 per visit     Co-Pay: no visit limit Home Health: 100%      Co-Pay: no visit limit DME: 80%     Co-Pay: 20% Providers: in network  SECONDARY: none        Medicaid Application Date:       Case Manager:  Disability Application Date:       Case Worker:   Emergency Facilities manager Information   Name Relation Home Work Mobile   Croft,Debbie Significant other [phone]  [phone]   Rhodia Albright Daughter   [phone]   Miller, L[place]  [phone]     Current Medical History  Patient Admitting Diagnosis: Lumbar postlaminectomy syndrome with postoperative infection and deconditioning.  History of Present Illness: JAICEON [name] is a 68 y.o. right-handed male with history of rheumatoid arthritis with chronic prednisone, lumbar L4-5 fusion 2010. Admitted 03/02/2013 underwent L1-S1 decompression and fusion with course complicated by hypotension requiring pressor support. He was discharged to skilled nursing facility 03/10/2013 and returned to [facility] 03/16/2013 with fever 102 and wound drainage. On 03/17/2013 he returned to the operating room for debridement of his wound. A wound swab grew P aeruginosa (pan  sens). He was discharged home with assistance from his wife. Patient presented 05/23/2013 with nausea vomiting poor appetite low-grade fever. He had been using a wheelchair rolling walker for short distances while at home. Will recent CT of the head showed no acute abnormalities. Chest x-ray negative. Urine culture 25,000 the species. Mild hyponatremia 126. Presently maintained on broad-spectrum antibiotics with Primaxin as well as vancomycin. Subcutaneous Lovenox added for DVT prophylaxis. Patient with vocal hoarseness but no evidence of obvious vocal cord paralysis after ENT evaluation.   He thinks that much of his anorexia, 30 pound weight loss and malnutrition is related to his severe diffuse pain from rheumatoid arthritis that has flared since stopping Humira.  IV antibiotics recommended discontinuing per ID.  Past Medical History  Past Medical History  Diagnosis Date  . Hx of colonic polyps   . Diverticulosis   . Hypertension   . 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 CAD (coronary artery disease)     minimal coronary plaque in the LAD and right coronary system. 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 7-06.  Last nuclear stress 11-17-06 fixed anterior/ inferior defect, no inducible ischemia, EF 81%  . Allergic rhinitis   . Hx of colonoscopy   . PONV (postoperative nausea and vomiting)   . Dysrhythmia 01-24-12    past hx. A.Fib x1 episode-responded to med.  . Depression   . Anxiety     pt. feels its related to the medicine that he takes   . GERD (gastroesophageal reflux disease)   . COPD (chronic obstructive pulmonary disease)     pt denies this hx on 05/24/2013  . Osteoarthritis   . RA (rheumatoid arthritis)   . Chronic back pain     Family History  family history includes Cancer in his father and mother; Colon polyps in his father; Coronary artery disease in his father  and maternal aunt; Heart attack in his brother and maternal aunt; Hemochromatosis in his sister; Hyperlipidemia in his brother; Hypertension in his father; Lung cancer in his mother; Macular degeneration in his mother; Other in his brother and mother; Prostate cancer in his father; Rheum arthritis in his sister; Thyroid disease in his sister; Uterine cancer in his mother. There is no history of Diabetes, Colon cancer, Esophageal cancer, Rectal cancer, or Stomach cancer.  Prior Rehab/Hospitalizations: [facility] SN[facility] for one day   Current Medications  Current facility-administered medications:0.9 %  sodium chloride infusion, , Intravenous, Continuous, Jonetta Osgood, MD, Last Rate: 100 mL/hr at 05/25/13 1548;  acetaminophen (TYLENOL) suppository 650 mg, 650 mg, Rectal, Q6H PRN, Jonetta Osgood, MD;  acetaminophen (TYLENOL) tablet 650 mg, 650 mg, Oral, Q6H PRN, Jonetta Osgood, MD, 650 mg at 05/24/13 0945 albuterol (PROVENTIL) (2.5 MG/3ML) 0.083% nebulizer solution 2.5 mg, 2.5 mg, Nebulization, Q2H PRN, Jonetta Osgood, MD;  ALPRAZolam Duanne Moron) tablet 0.5 mg, 0.5 mg, Oral, TID PRN, Jonetta Osgood, MD;  alum & mag hydroxide-simeth (MAALOX/MYLANTA) 200-200-20 MG/5ML suspension 30 mL, 30 mL, Oral, Q6H PRN, Jonetta Osgood, MD;  aspirin EC tablet 81 mg, 81 mg, Oral, q morning - 10a, Jonetta Osgood, MD, 81 mg at 05/27/13 1122 cholecalciferol (VITAMIN D) tablet 1,000 Units, 1,000 Units, Oral, Daily, Jonetta Osgood, MD, 1,000 Units at 05/27/13 1122;  colchicine tablet 0.6 mg, 0.6 mg, Oral, BID, Jonetta Osgood, MD, 0.6 mg at 05/27/13 1122;  docusate sodium (COLACE) capsule 100 mg, 100 mg, Oral, BID, Jonetta Osgood, MD, 100 mg at 05/27/13 1121;  enoxaparin (LOVENOX) injection 40 mg, 40 mg, Subcutaneous, Q24H, Jonetta Osgood, MD, 40 mg at 05/27/13 1120 feeding supplement (ENSURE COMPLETE) (ENSURE COMPLETE) liquid 237 mL, 237 mL, Oral, TID WC, Shanker Kristeen Mans, MD, 237 mL at 05/27/13 1227;   FLUoxetine (PROZAC) capsule 40 mg, 40 mg, Oral, BID, Jonetta Osgood, MD, 40 mg at 05/27/13 1121;  fluticasone (FLONASE) 50 MCG/ACT nasal spray 2 spray, 2 spray, Each Nare, QHS, Jonetta Osgood, MD, 2 spray at 05/26/13 2300 gabapentin (NEURONTIN) tablet 600 mg, 600 mg, Oral, BID, Verlee Monte, MD, 600 mg at 05/27/13 1000;  guaiFENesin (MUCINEX) 12 hr tablet 1,200 mg, 1,200 mg, Oral, BID, Verlee Monte, MD, 1,200 mg at 05/27/13 1120;  guaiFENesin-dextromethorphan (ROBITUSSIN DM) 100-10 MG/5ML syrup 5 mL, 5 mL, Oral, Q4H PRN, Jonetta Osgood, MD;  HYDROmorphone (DILAUDID) tablet 4 mg, 4 mg, Oral, Q8H PRN, Jonetta Osgood, MD, 4 mg at 05/27/13 0616 losartan (COZAAR) tablet 50 mg, 50 mg, Oral, Daily, Jonetta Osgood, MD, 50 mg at 05/27/13 1121;  metoprolol tartrate (LOPRESSOR) tablet 25 mg, 25 mg, Oral, BID,  Ripudeep Krystal Eaton, MD, 25 mg at 05/27/13 1121;  ondansetron (ZOFRAN) injection 4 mg, 4 mg, Intravenous, Q6H PRN, Jonetta Osgood, MD;  ondansetron (ZOFRAN) tablet 4 mg, 4 mg, Oral, Q6H PRN, Jonetta Osgood, MD OxyCODONE (OXYCONTIN) 12 hr tablet 10 mg, 10 mg, Oral, Q12H, Jonetta Osgood, MD, 10 mg at 05/27/13 1120;  pantoprazole (PROTONIX) EC tablet 40 mg, 40 mg, Oral, BID, Jonetta Osgood, MD, 40 mg at 05/27/13 1120;  polyethylene glycol (MIRALAX / GLYCOLAX) packet 17 g, 17 g, Oral, Daily PRN, Jonetta Osgood, MD;  polyvinyl alcohol (LIQUIFILM TEARS) 1.4 % ophthalmic solution 2 drop, 2 drop, Both Eyes, BID PRN, Jonetta Osgood, MD predniSONE (DELTASONE) tablet 5 mg, 5 mg, Oral, QPM, Verlee Monte, MD, 5 mg at 05/26/13 1816;  simvastatin (ZOCOR) tablet 40 mg, 40 mg, Oral, Q lunch, Jonetta Osgood, MD, 40 mg at 05/27/13 1227;  sodium chloride 0.9 % injection 3 mL, 3 mL, Intravenous, Q12H, Jonetta Osgood, MD, 3 mL at 05/27/13 1122;  tiotropium (SPIRIVA) inhalation capsule 18 mcg, 18 mcg, Inhalation, Daily, Jonetta Osgood, MD, 18 mcg at 05/27/13 0569 traZODone (DESYREL) tablet 100 mg, 100  mg, Oral, QHS PRN, Verlee Monte, MD, 100 mg at 05/26/13 2329  Patients Current Diet: Cardiac  Precautions / Restrictions Precautions Precautions: Back Type of Shoulder Precautions: Pt able to state 3/3 precautions. Precaution Booklet Issued: No Precaution Comments: reviewed log roll technique for getting up OOB.   Restrictions Weight Bearing Restrictions: No   Prior Activity Level Limited Community (1-2x/wk): limited since 02/26/13 surgery. 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   Current Functional Level Cognition  Arousal/Alertness: Awake/alert Overall Cognitive Status: Within Functional Limits for tasks assessed Orientation Level: Oriented X4    Extremity Assessment (includes Sensation/Coordination)          ADLs  Eating/Feeding: Independent Where Assessed - Eating/Feeding: Chair Grooming: Set up Where Assessed - Grooming: Supported sitting Upper Body Bathing: Minimal assistance Where Assessed - Upper Body Bathing: Supported sitting Lower Body Bathing: Maximal assistance Where Assessed - Lower Body Bathing: Supported sit to stand Upper Body Dressing: Minimal assistance Where Assessed - Upper Body Dressing: Supported sitting Lower Body Dressing: Moderate assistance Where Assessed - Lower Body Dressing: Supported sit to Lobbyist: Minimal assistance Toilet Transfer Method: Sit to stand;Stand pivot Science writer: Comfort height toilet Toileting - Clothing Manipulation and Hygiene: Minimal assistance Where Assessed - Microbiologist and Hygiene: Sit to stand from 3-in-1 or toilet Equipment Used: Gait belt;Rolling walker Transfers/Ambulation Related to ADLs: minA ADL Comments: Pt given theratubing to build up utensils and toothbrush, etc. also given build up foam grip for pen.Educated family on use. Tubing helps with sefl feeding. discussed other types of AE.    Mobility  Overal bed mobility: Needs Assistance Bed Mobility: Rolling;Sidelying to Sit Rolling: Supervision Sidelying to sit: HOB elevated General bed mobility comments: Min assist to support trunk to roll to side.  Pt using bed rail for leverage.  Min assist to support trunk during transition to sit.  HOB flat, but again, pt relying heavily on bed rail for transitions.  Transfers  Overall transfer level: Needs assistance Equipment used: Rolling walker (2 wheeled) Transfers: Sit to/from Stand Sit to Stand: Min assist Stand pivot transfers: Supervision General transfer comment: Min assist to support trunk during transitions from various surfaces (bed, toilet) with verbal cues for safe hand placement and assist to anteriorly weight shift body and prevent uncontrolled descent to sit.      Ambulation / Gait / Stairs / Pharmacist, hospital (Feet): 200 Feet Gait velocity: decreased General Gait Details: Pt continues to have increased left leg weakness with increased gait distance and fatigue.  Min assist to support trunk for balance during gait, especially when turning or navigating tighter obstables.  Verbal cues for safe use of RW and upright posture.     Posture / Balance      Special needs/care consideration Bowel mgmt: continent Bladder mgmt: continent Diabetic mgmt yes   Previous Home Environment Living Arrangements: Spouse/significant other;Other (Comment) (Debbie and pt living together for 5 yrs; not married)  Lives With: Significant other Available Help at Discharge: Other (Comment) (Pt's daughter  in law available 24/7 to assist; [name] works ) Type of Home: [employer] Layout: Two level;Able to live on main level with bedroom/bathroom Home Access: Stairs to enter Entrance Stairs-Rails: Right;Left Entrance Stairs-Number of Steps: 5 Bathroom Shower/Tub: Multimedia programmer: Standard Bathroom Accessibility: Yes How Accessible: Accessible via walker [place]: No  Discharge Living Setting Plans for Discharge Living Setting: Other (Comment) (if not Mod I, pt plans to stay with his son and his wife for) Type of Home at Discharge: House Discharge Home Layout: One level Discharge Home Access: Level entry;Other (comment) (handicapped accessible) Discharge Bathroom Shower/Tub: Tub/shower unit Discharge Bathroom Toilet: Standard Discharge Bathroom Accessibility: Yes How Accessible: Accessible via walker Does the patient have any problems obtaining your medications?: No  Social/Family/Support Systems Patient Roles: Partner;Parent;Other (Comment) (business owner; [place]) Sport and exercise psychologist Information: Remo Lipps and [name] Anticipated Caregiver: [name] and April ( dtr in Sports coach) Anticipated Ambulance person Information: see above Ability/Limitations of Caregiver: [name] works 20 hrs per week at [employer] Caregiver Availability: 24/7 Discharge Plan Discussed with Primary Caregiver: Yes Is Caregiver In Agreement with Plan?: Yes Does Caregiver/Family have Issues with Lodging/Transportation while Pt is in Rehab?: No  Goals/Additional Needs Patient/Family Goal for Rehab: Supervision with PT and OT; improved vocalization and memory with SLP Expected length of stay: ELOS 14-20 days Additional Information: depending on his recovery will determine if he returns home with Debbie or goes to stay with son and his wife Pt/Family Agrees to Admission and willing to participate: Yes Program Orientation Provided & Reviewed with Pt/Caregiver Including Roles  & Responsibilities:  Yes   Decrease burden of Care through IP rehab admission: n/a  Possible need for SNF placement upon discharge:no   Patient Condition: This patient's condition remains as documented in the consult dated 05/25/13, in which the Rehabilitation Physician determined and documented that the patient's condition is appropriate for intensive rehabilitative care in an inpatient rehabilitation facility. Will admit to inpatient rehab today.  Preadmission Screen Completed By:  Cleatrice Burke, 05/27/2013 12:57 PM ______________________________________________________________________   Discussed status with Dr. Letta Pate on 05/27/13 at  1256 and received telephone approval for admission today.  Admission Coordinator:  Cleatrice Burke, time 6269 Date 05/27/2013

## 2013-05-26 NOTE — Progress Notes (Signed)
Occupational Therapy Treatment Patient Details Name: [name] MRN: [id number] DOB: [birth year] Today's Date: 05/26/2013 Time: 1547-1600 OT Time Calculation (min): 13 min  OT Assessment / Plan / Recommendation  History of present illness 68 y.o. male admitted to [facility] on 05/23/13 with Past Medical History of , recent back surgery complicated by a wound infection, malnutrition, diarrhea, laryngitis, multiple hospitalizations. He presents this admission with fever, N/V and weakness.  Dx with hyponatremia, SIRS, FTT, fever of unknown origin, dehydration and encephalopathy.  He is being r/o for flu as he reports his wife currently has the flu at home.  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     OT comments  Given theratubing to help build up handles for utensils, toothbrush, pen, etc. Will give pt resource materials for other types of AE. Pt very appreciative and states that tubing helps significantly. Rec CIR  Follow Up Recommendations  CIR;Supervision/Assistance - 24 hour    Barriers to Discharge       Equipment Recommendations  None recommended by OT    Recommendations for Other Services Rehab consult  Frequency Min 3X/week   Progress towards OT Goals Progress towards OT goals: Progressing toward goals  Plan Discharge plan remains appropriate    Precautions / Restrictions Precautions Precautions: Back Type of Shoulder Precautions: Pt able to state 3/3 precautions. Precaution Booklet Issued: No Precaution Comments: reviewed log roll technique for getting up OOB.     Pertinent Vitals/Pain no apparent distress     ADL  Lower Body Dressing: Moderate assistance Where Assessed - Lower Body Dressing: Supported sit to Pharmacist, hospital: Minimal assistance Toilet Transfer Method: Sit to stand;Stand pivot Acupuncturist: Comfort height toilet Toileting - Clothing Manipulation  and Hygiene: Minimal assistance Where Assessed - Engineer, mining and Hygiene: Sit to stand from 3-in-1 or toilet Equipment Used: Gait belt;Rolling walker Transfers/Ambulation Related to ADLs: minA ADL Comments: Pt given theratubing to build up utensils and toothbrush, etc. also given build up foam grip for pen.Educated family on use. Tubing helps with sefl feeding. discussed other types of AE.    OT Diagnosis:    OT Problem List:   OT Treatment Interventions:     OT Goals(current goals can now be found in the care plan section) Acute Rehab OT Goals Patient Stated Goal: to get intensive rehab OT Goal Formulation: With patient Time For Goal Achievement: 06/08/12 Potential to Achieve Goals: Good ADL Goals Pt Will Perform Grooming: with modified independence;standing Pt Will Perform Lower Body Bathing: with modified independence;with adaptive equipment;sit to/from stand Pt Will Perform Lower Body Dressing: with modified independence;with adaptive equipment;sit to/from stand Pt Will Transfer to Toilet: with modified independence;bedside commode;ambulating Pt Will Perform Toileting - Clothing Manipulation and hygiene: with modified independence;sit to/from stand Pt Will Perform Tub/Shower Transfer: Shower transfer;with modified independence;ambulating Additional ADL Goal #1: Complete theraband HEP independently for UB strengthening. - level 2  Visit Information  Last OT Received On: 05/26/13 Assistance Needed: +1 History of Present Illness: 69 y.o. male admitted to [facility] on 05/23/13 with Past Medical History of , recent back surgery complicated by a wound infection, malnutrition, diarrhea, laryngitis, multiple hospitalizations. He presents this admission with fever, N/V and weakness.  Dx with hyponatremia, SIRS, FTT, fever of unknown origin, dehydration and encephalopathy.  He is being r/o for flu as he reports his wife currently has the flu at home.  Also, of significance, pt with  hemocromotosis  with multiple joint surgeries as a result (bil shoulders, bil knees, right ankle).      Subjective Data      Prior Functioning       Cognition  Cognition Arousal/Alertness: Awake/alert Behavior During Therapy: WFL for tasks assessed/performed Overall Cognitive Status: Within Functional Limits for tasks assessed    Mobility  Bed Mobility Overal bed mobility: Needs Assistance Bed Mobility: Rolling;Sidelying to Sit Rolling: Supervision Sidelying to sit: HOB elevated Transfers Overall transfer level: Needs assistance Transfers: Sit to/from Stand Sit to Stand: Min assist Stand pivot transfers: Supervision    Exercises      Balance Balance Overall balance assessment: Needs assistance Standing balance support: Bilateral upper extremity supported Standing balance-Leahy Scale: Fair  End of Session OT - End of Session Activity Tolerance: Patient tolerated treatment well Patient left: in chair;with call bell/phone within reach Nurse Communication: Mobility status  GO     Blaire Hodsdon,HILLARY 05/26/2013, 4:43 PM [employer], OTR/L  [phone] 05/26/2013

## 2013-05-26 NOTE — ED Provider Notes (Signed)
I saw and evaluated the patient, reviewed the resident's note and I agree with the findings and plan.    Agree with EKG interpretation if present.  Pt with general ill feeling and nausea, has had intermittent fevers. History concerning for post-op pseudomonas infection. Also has numerous pain meds and anxiolytics in his medication bag. Unclear what he has been taking and when.   Charles B. Karle Starch, MD 05/26/13 1131

## 2013-05-26 NOTE — Progress Notes (Signed)
Inpt. Rehab  I met with the patient and his daughter Lenna Sciara at the bedside today.  They both request Inpt. Rehab for pt.'s rehab recovery. I will seek [facility] insurance approval for admission when he is medically ready.  Please call if questions.  [place] Admissions Coordinator Cell [phone] Office [phone]

## 2013-05-26 NOTE — Consult Note (Signed)
[facility]    Date of Admission:  05/23/2013           Day 4 vancomycin        Day 4 imipenem       Reason for Consult: Recurrent fever and recent postoperative lumbar infection    Referring Physician: Dr. Verlee Monte   Principal Problem:   Fever Active Problems:   Rheumatoid arthritis(714.0)   Protein-calorie malnutrition, severe   Wound infection after surgery   Unintentional weight loss   PSEUDOGOUT   HYPERTENSION   CAD   COPD (chronic obstructive pulmonary disease)   Acute encephalopathy   Frequent falls   Hyponatremia   SIRS (systemic inflammatory response syndrome)   Dehydration   Chronic narcotic use   Degenerative arthritis of spine   Hemochromatosis   Normocytic anemia   . aspirin EC  81 mg Oral q morning - 10a  . cholecalciferol  1,000 Units Oral Daily  . colchicine  0.6 mg Oral BID  . docusate sodium  100 mg Oral BID  . enoxaparin (LOVENOX) injection  40 mg Subcutaneous Q24H  . feeding supplement (ENSURE COMPLETE)  237 mL Oral TID WC  . FLUoxetine  40 mg Oral BID  . fluticasone  2 spray Each Nare QHS  . gabapentin  600 mg Oral 2 times per day  . guaiFENesin  1,200 mg Oral BID  . imipenem-cilastatin  500 mg Intravenous Q8H  . losartan  50 mg Oral Daily  . metoprolol tartrate  25 mg Oral BID  . OxyCODONE  10 mg Oral Q12H  . pantoprazole  40 mg Oral BID  . predniSONE  5 mg Oral QPM  . simvastatin  40 mg Oral Q lunch  . sodium chloride  3 mL Intravenous Q12H  . tiotropium  18 mcg Inhalation Daily  . vancomycin  750 mg Intravenous Q12H    Recommendations: 1. Discontinue vancomycin and imipenem 2. Repeat sedimentation rate and C-reactive protein 3. Consider restarting Humira soon   Assessment: [name] has multifactorial failure to thrive and recent recurrent fevers. His initial fevers in December were related to Pseudomonas postoperative wound infection but I think there is a very good possibility that  that has been cured following incision and drainage and 6 weeks of IV cefepime. His most recent fever was probably an upper respiratory infection which is improving. I do not see any evidence of pneumonia on chest x-ray. His urinalysis was negative for any signs of infection and blood cultures are negative. He has no diarrhea to suggest C. difficile colitis. His back pain has improved and I doubt that he has active lumbar infection at this time. I will stop empiric antibiotics and observe.  He thinks that much of his anorexia, 30 pound weight loss and malnutrition is related to his severe diffuse pain from rheumatoid arthritis that has flared since stopping Humira. Although Humira might theoretically have some negative impact on wound healing I think the biggest limiting factor is his malnutrition and I would consider restarting Humira soon to see if that improves his pain and allows him to eat more and gain weight.    HPI: [name] is a 68 y.o. male who has degenerative spine disease. He underwent L4-5 fusion in 2010. He was admitted last November and underwent lumbar decompression from L1-S1 followed by fusion on November 25. His postoperative course was complicated by hypotension and radicular pain. He received a  course of Decadron and was discharged to a skilled nursing facility on December 3. He is now in his fourth readmission since that time. He was readmitted on December 9 with fever and wound drainage. He underwent incision and drainage and operative cultures grew Pseudomonas. He was discharged on IV cefepime. He saw my partner, Dr. Tommy Medal, on January 5. At that time he was extremely weak, losing weight and was hypotensive. No new infection was found. It was felt that his hypotension and weakness was probably related to blood pressure medication and possibly opiates. He was discharged again to continue cefepime. Around January 22 he developed a diffuse rash and delirium. He was seen on  January 24. It was felt that the rash was an allergic reaction to cefepime. His PICC line was pulled and cefepime was stopped. Dr. Tommy Medal had initially recommended starting oral ciprofloxacin but it does not appear that that was done.  This rash cleared but he continued to have anorexia and weight loss. He was having recurrent falls at home and intermittent confusion. He was readmitted for a third time on January 29. It was felt that his problem was again related to opiates. He was discharged home on February 1. He reported a temperature of 103 at home on February 10. He was readmitted this time on February 15 with weakness and flulike symptoms. He had a temperature of 102.5. He states his wife is sick at home with pneumonia.  He was started on empiric oseltamivir, vancomycin and imipenem. Oseltamivir was stopped after his influenza PCR returned negative. He has defervesced and is feeling a little bit better. He has some dry cough. He states that his low back pain has improved. However his generalized joint pain is much worse. He has been off of Humira since his back infection was diagnosed in December. He feels it is generalized rheumatoid arthritis pain is causing him to not eat and therefore lose weight.   Review of Systems: Constitutional: positive for anorexia, fevers and weight loss, negative for chills and sweats Eyes: negative Ears, nose, mouth, throat, and face: negative Respiratory: positive for cough, negative for dyspnea on exertion, hemoptysis, pleurisy/chest pain and sputum Cardiovascular: negative Gastrointestinal: positive for nausea and reflux symptoms, negative for abdominal pain, change in bowel habits, diarrhea and vomiting Genitourinary:negative Musculoskeletal:positive for arthralgias and stiff joints  Past Medical History  Diagnosis Date  . Hx of colonic polyps   . Diverticulosis   . Hypertension   . Hemochromatosis     dx'd 14 yrs ago last ferritin Aug 11, 08 52  (22-322), Fe 136  . CAD (coronary artery disease)     minimal coronary plaque in the LAD and right coronary system. 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 7-06. Last nuclear stress 11-17-06 fixed anterior/ inferior defect, no inducible ischemia, EF 81%  . Allergic rhinitis   . Hx of colonoscopy   . PONV (postoperative nausea and vomiting)   . Dysrhythmia 01-24-12    past hx. A.Fib x1 episode-responded to med.  . Depression   . Anxiety     pt. feels its related to the medicine that he takes   . GERD (gastroesophageal reflux disease)   . COPD (chronic obstructive pulmonary disease)     pt denies this hx on 05/24/2013  . Osteoarthritis   . RA (rheumatoid arthritis)   . Chronic back pain     History  Substance Use Topics  . Smoking status:  Never Smoker   . Smokeless tobacco: Never Used  . Alcohol Use: No    Family History  Problem Relation Age of Onset  . Uterine cancer Mother     survivor  . Macular degeneration Mother   . Other Mother     ankle edema  . Lung cancer Mother   . Cancer Mother     ovarian, lung  . Coronary artery disease Father   . Hypertension Father   . Prostate cancer Father   . Colon polyps Father   . Cancer Father     prostate  . Thyroid disease Sister   . Heart attack Brother   . Hyperlipidemia Brother   . Other Brother     Schizophrenic  . Diabetes Neg Hx   . Colon cancer Neg Hx   . Esophageal cancer Neg Hx   . Rectal cancer Neg Hx   . Stomach cancer Neg Hx   . Coronary artery disease Maternal Aunt   . Heart attack Maternal Aunt   . Rheum arthritis Sister   . Hemochromatosis Sister    Allergies  Allergen Reactions  . Morphine And Related Shortness Of Breath, Nausea And Vomiting, Swelling and Other (See Comments)    Agitation, tolerates dilaudid  . Penicillins Hives, Shortness Of Breath and Rash    REACTION: hives, breathing problems  . Cefixime     rash  . Percocet  [Oxycodone-Acetaminophen] Anxiety    Takes OxyContin at home.    OBJECTIVE: Blood pressure 143/97, pulse 78, temperature 97.8 F (36.6 C), temperature source Oral, resp. rate 18, height 5' 6"  (1.676 m), weight 60.873 kg (134 lb 3.2 oz), SpO2 95.00%. General: He is alert and sitting up in a chair. He is in no distress but he is thin and weak Skin: No rash Oral: No oropharyngeal lesions Lungs: Clear Cor: Regular S1 and S2 with no murmurs Abdomen: Mild diffuse tenderness. Soft with no palpable masses. Quiet but detectable bowel sounds Joints and extremities: He has chronic deformities due to rheumatoid arthritis and possibly degenerative arthritis. No acute inflammation is noted Lumbar: Surgical incision site is still open but the area is much smaller than documented in a photograph in Dr. Lucianne Lei Dam's note on January 5. There is no appreciable wound drainage, odor or surrounding cellulitis  Lab Results Lab Results  Component Value Date   WBC 8.1 05/24/2013   HGB 11.8* 05/24/2013   HCT 34.2* 05/24/2013   MCV 81.8 05/24/2013   PLT 342 05/24/2013    Lab Results  Component Value Date   CREATININE 0.58 05/26/2013   BUN 7 05/26/2013   NA 130* 05/26/2013   K 3.6* 05/26/2013   CL 98 05/26/2013   CO2 25 05/26/2013    Lab Results  Component Value Date   ALT 13 05/23/2013   AST 28 05/23/2013   ALKPHOS 85 05/23/2013   BILITOT 1.0 05/23/2013    Lab Results  Component Value Date   CRP 24.1* 03/20/2013   Lab Results  Component Value Date   ESRSEDRATE 77* 03/20/2013   Microbiology: Recent Results (from the past 240 hour(s))  CULTURE, BLOOD (SINGLE)     Status: None   Collection Time    05/18/13  4:01 PM      Result Value Ref Range Status   Organism ID, Bacteria NO GROWTH 5 DAYS   Final  CULTURE, BLOOD (SINGLE)     Status: None   Collection Time    05/18/13  4:01 PM  Result Value Ref Range Status   Organism ID, Bacteria NO GROWTH 5 DAYS   Final  CULTURE, BLOOD (ROUTINE X 2)     Status:  None   Collection Time    05/23/13  8:21 PM      Result Value Ref Range Status   Specimen Description BLOOD LEFT ARM   Final   Special Requests BOTTLES DRAWN AEROBIC AND ANAEROBIC 10CC EA   Final   Culture  Setup Time     Final   Value: 05/24/2013 01:43     Performed at [facility]   Culture     Final   Value:        BLOOD CULTURE RECEIVED NO GROWTH TO DATE CULTURE WILL BE HELD FOR 5 DAYS BEFORE ISSUING A FINAL NEGATIVE REPORT     Performed at [facility]   Report Status PENDING   Incomplete  CULTURE, BLOOD (ROUTINE X 2)     Status: None   Collection Time    05/23/13  8:26 PM      Result Value Ref Range Status   Specimen Description BLOOD LEFT HAND   Final   Special Requests BOTTLES DRAWN AEROBIC ONLY 10CC   Final   Culture  Setup Time     Final   Value: 05/24/2013 01:43     Performed at [facility]   Culture     Final   Value:        BLOOD CULTURE RECEIVED NO GROWTH TO DATE CULTURE WILL BE HELD FOR 5 DAYS BEFORE ISSUING A FINAL NEGATIVE REPORT     Performed at [facility]   Report Status PENDING   Incomplete  URINE CULTURE     Status: None   Collection Time    05/23/13  8:43 PM      Result Value Ref Range Status   Specimen Description URINE, RANDOM   Final   Special Requests NONE   Final   Culture  Setup Time     Final   Value: 05/23/2013 21:44     Performed at [facility]     Final   Value: 25,000 COLONIES/ML     Performed at [facility]   Culture     Final   Value: Multiple bacterial morphotypes present, none predominant. Suggest appropriate recollection if clinically indicated.     Performed at [facility]   Report Status 05/24/2013 FINAL   Final    Michel Bickers, MD [facility] [phone] pager   [phone] cell 05/26/2013, 2:22 PM

## 2013-05-27 ENCOUNTER — Ambulatory Visit: Payer: Medicare Other

## 2013-05-27 ENCOUNTER — Inpatient Hospital Stay (HOSPITAL_COMMUNITY)
Admission: RE | Admit: 2013-05-27 | Discharge: 2013-06-10 | DRG: 945 | Disposition: A | Discharge status: Home Health | Payer: Medicare Other | Source: Intra-hospital | Attending: Physical Medicine & Rehabilitation | Admitting: Physical Medicine & Rehabilitation

## 2013-05-27 ENCOUNTER — Ambulatory Visit: Payer: Medicare Other | Admitting: Physical Therapy

## 2013-05-27 DIAGNOSIS — T8140XA Infection following a procedure, unspecified, initial encounter: Secondary | ICD-10-CM

## 2013-05-27 DIAGNOSIS — Z5189 Encounter for other specified aftercare: Principal | ICD-10-CM

## 2013-05-27 DIAGNOSIS — R5381 Other malaise: Secondary | ICD-10-CM | POA: Diagnosis not present

## 2013-05-27 DIAGNOSIS — E860 Dehydration: Secondary | ICD-10-CM

## 2013-05-27 DIAGNOSIS — R32 Unspecified urinary incontinence: Secondary | ICD-10-CM

## 2013-05-27 DIAGNOSIS — E440 Moderate protein-calorie malnutrition: Secondary | ICD-10-CM | POA: Diagnosis not present

## 2013-05-27 DIAGNOSIS — Z7982 Long term (current) use of aspirin: Secondary | ICD-10-CM | POA: Diagnosis not present

## 2013-05-27 DIAGNOSIS — IMO0002 Reserved for concepts with insufficient information to code with codable children: Secondary | ICD-10-CM | POA: Diagnosis not present

## 2013-05-27 DIAGNOSIS — F3289 Other specified depressive episodes: Secondary | ICD-10-CM | POA: Diagnosis not present

## 2013-05-27 DIAGNOSIS — J449 Chronic obstructive pulmonary disease, unspecified: Secondary | ICD-10-CM

## 2013-05-27 DIAGNOSIS — Z96659 Presence of unspecified artificial knee joint: Secondary | ICD-10-CM

## 2013-05-27 DIAGNOSIS — R490 Dysphonia: Secondary | ICD-10-CM

## 2013-05-27 DIAGNOSIS — E785 Hyperlipidemia, unspecified: Secondary | ICD-10-CM | POA: Diagnosis not present

## 2013-05-27 DIAGNOSIS — F329 Major depressive disorder, single episode, unspecified: Secondary | ICD-10-CM | POA: Diagnosis not present

## 2013-05-27 DIAGNOSIS — M961 Postlaminectomy syndrome, not elsewhere classified: Secondary | ICD-10-CM | POA: Diagnosis not present

## 2013-05-27 DIAGNOSIS — Z79899 Other long term (current) drug therapy: Secondary | ICD-10-CM

## 2013-05-27 DIAGNOSIS — I2510 Atherosclerotic heart disease of native coronary artery without angina pectoris: Secondary | ICD-10-CM

## 2013-05-27 DIAGNOSIS — F1190 Opioid use, unspecified, uncomplicated: Secondary | ICD-10-CM

## 2013-05-27 DIAGNOSIS — I4891 Unspecified atrial fibrillation: Secondary | ICD-10-CM

## 2013-05-27 DIAGNOSIS — Z9861 Coronary angioplasty status: Secondary | ICD-10-CM | POA: Diagnosis not present

## 2013-05-27 DIAGNOSIS — M069 Rheumatoid arthritis, unspecified: Secondary | ICD-10-CM

## 2013-05-27 DIAGNOSIS — R509 Fever, unspecified: Secondary | ICD-10-CM

## 2013-05-27 DIAGNOSIS — Z96619 Presence of unspecified artificial shoulder joint: Secondary | ICD-10-CM | POA: Diagnosis not present

## 2013-05-27 DIAGNOSIS — K5900 Constipation, unspecified: Secondary | ICD-10-CM | POA: Diagnosis not present

## 2013-05-27 DIAGNOSIS — G9340 Encephalopathy, unspecified: Secondary | ICD-10-CM

## 2013-05-27 DIAGNOSIS — Z981 Arthrodesis status: Secondary | ICD-10-CM

## 2013-05-27 DIAGNOSIS — E236 Other disorders of pituitary gland: Secondary | ICD-10-CM

## 2013-05-27 DIAGNOSIS — J4489 Other specified chronic obstructive pulmonary disease: Secondary | ICD-10-CM

## 2013-05-27 DIAGNOSIS — T8149XA Infection following a procedure, other surgical site, initial encounter: Secondary | ICD-10-CM

## 2013-05-27 DIAGNOSIS — I10 Essential (primary) hypertension: Secondary | ICD-10-CM

## 2013-05-27 DIAGNOSIS — M479 Spondylosis, unspecified: Secondary | ICD-10-CM

## 2013-05-27 DIAGNOSIS — Y838 Other surgical procedures as the cause of abnormal reaction of the patient, or of later complication, without mention of misadventure at the time of the procedure: Secondary | ICD-10-CM

## 2013-05-27 DIAGNOSIS — N179 Acute kidney failure, unspecified: Secondary | ICD-10-CM

## 2013-05-27 LAB — BASIC METABOLIC PANEL
BUN: 7 mg/dL (ref 6–23)
CO2: 28 mEq/L (ref 19–32)
Calcium: 7.8 mg/dL — ABNORMAL LOW (ref 8.4–10.5)
Chloride: 96 mEq/L (ref 96–112)
Creatinine, Ser: 0.58 mg/dL (ref 0.50–1.35)
GFR calc Af Amer: >90 mL/min (ref >90)
GFR calc non Af Amer: >90 mL/min (ref >90)
Glucose, Bld: 92 mg/dL (ref 70–99)
Potassium: 4.2 mEq/L (ref 3.7–5.3)
Sodium: 132 mEq/L — ABNORMAL LOW (ref 137–147)

## 2013-05-27 LAB — CBC
HCT: 34.3 % — ABNORMAL LOW (ref 39.0–52.0)
Hemoglobin: 11.6 g/dL — ABNORMAL LOW (ref 13.0–17.0)
MCH: 28.4 pg (ref 26.0–34.0)
MCHC: 33.8 g/dL (ref 30.0–36.0)
MCV: 84.1 fL (ref 78.0–100.0)
Platelets: 346 10*3/uL (ref 150–400)
RBC: 4.08 MIL/uL — ABNORMAL LOW (ref 4.22–5.81)
RDW: 14.7 % (ref 11.5–15.5)
WBC: 5.2 10*3/uL (ref 4.0–10.5)

## 2013-05-27 LAB — GLUCOSE, CAPILLARY
Glucose-Capillary: 94 mg/dL (ref 70–99)
Glucose-Capillary: 113 mg/dL — ABNORMAL HIGH (ref 70–99)
Glucose-Capillary: 86 mg/dL (ref 70–99)
Glucose-Capillary: 124 mg/dL — ABNORMAL HIGH (ref 70–99)

## 2013-05-27 LAB — PROCALCITONIN: Procalcitonin: <0.1 ng/mL

## 2013-05-27 LAB — CREATININE, SERUM
Creatinine, Ser: 0.49 mg/dL — ABNORMAL LOW (ref 0.50–1.35)
GFR calc Af Amer: >90 mL/min (ref >90)
GFR calc non Af Amer: >90 mL/min (ref >90)

## 2013-05-27 MED ORDER — GUAIFENESIN ER 600 MG PO TB12
1200.0000 mg | ORAL_TABLET | Freq: Two times a day (BID) | ORAL | Status: DC
  Administered 2013-05-27 – 2013-06-10 (×28): 1200 mg via ORAL
  Filled 2013-05-27 (×34): qty 2

## 2013-05-27 MED ORDER — HYDROMORPHONE HCL 2 MG PO TABS
4.0000 mg | ORAL_TABLET | Freq: Three times a day (TID) | ORAL | Status: DC | PRN
  Administered 2013-05-27 – 2013-06-03 (×14): 4 mg via ORAL
  Filled 2013-05-27 (×15): qty 2

## 2013-05-27 MED ORDER — ENOXAPARIN SODIUM 40 MG/0.4ML ~~LOC~~ SOLN
40.0000 mg | SUBCUTANEOUS | Status: DC
  Administered 2013-05-28 – 2013-06-07 (×11): 40 mg via SUBCUTANEOUS
  Filled 2013-05-27 (×12): qty 0.4

## 2013-05-27 MED ORDER — ASPIRIN EC 81 MG PO TBEC
81.0000 mg | DELAYED_RELEASE_TABLET | Freq: Every morning | ORAL | Status: DC
  Administered 2013-05-28 – 2013-06-10 (×13): 81 mg via ORAL
  Filled 2013-05-27 (×15): qty 1

## 2013-05-27 MED ORDER — GABAPENTIN 600 MG PO TABS
600.0000 mg | ORAL_TABLET | Freq: Two times a day (BID) | ORAL | Status: DC
  Administered 2013-05-27: 600 mg via ORAL
  Filled 2013-05-27 (×2): qty 1

## 2013-05-27 MED ORDER — OXYCODONE HCL ER 10 MG PO T12A
10.0000 mg | EXTENDED_RELEASE_TABLET | Freq: Two times a day (BID) | ORAL | Status: DC
  Administered 2013-05-27 – 2013-06-10 (×28): 10 mg via ORAL
  Filled 2013-05-27 (×29): qty 1

## 2013-05-27 MED ORDER — COLCHICINE 0.6 MG PO TABS
0.6000 mg | ORAL_TABLET | Freq: Two times a day (BID) | ORAL | Status: DC
  Administered 2013-05-27 – 2013-05-30 (×6): 0.6 mg via ORAL
  Filled 2013-05-27 (×10): qty 1

## 2013-05-27 MED ORDER — ACETAMINOPHEN 650 MG RE SUPP: 650.0000 mg | Freq: Four times a day (QID) | RECTAL | Status: DC | PRN

## 2013-05-27 MED ORDER — VITAMIN D3 25 MCG (1000 UNIT) PO TABS
1000.0000 [IU] | ORAL_TABLET | Freq: Every day | ORAL | Status: DC
  Administered 2013-05-28 – 2013-06-10 (×14): 1000 [IU] via ORAL
  Filled 2013-05-27 (×17): qty 1

## 2013-05-27 MED ORDER — ENSURE COMPLETE PO LIQD
237.0000 mL | Freq: Three times a day (TID) | ORAL | Status: DC
  Administered 2013-05-27 – 2013-05-31 (×6): 237 mL via ORAL

## 2013-05-27 MED ORDER — ONDANSETRON HCL 4 MG/2ML IJ SOLN: 4.0000 mg | Freq: Four times a day (QID) | INTRAMUSCULAR | Status: DC | PRN

## 2013-05-27 MED ORDER — METOPROLOL TARTRATE 25 MG PO TABS
25.0000 mg | ORAL_TABLET | Freq: Two times a day (BID) | ORAL | Status: DC
  Administered 2013-05-27 – 2013-06-10 (×28): 25 mg via ORAL
  Filled 2013-05-27 (×33): qty 1

## 2013-05-27 MED ORDER — ALBUTEROL SULFATE (2.5 MG/3ML) 0.083% IN NEBU
2.5000 mg | INHALATION_SOLUTION | RESPIRATORY_TRACT | Status: DC | PRN
  Administered 2013-05-29 – 2013-05-31 (×2): 2.5 mg via RESPIRATORY_TRACT
  Filled 2013-05-27 (×2): qty 3

## 2013-05-27 MED ORDER — PREDNISONE 5 MG PO TABS
5.0000 mg | ORAL_TABLET | Freq: Every evening | ORAL | Status: DC
  Administered 2013-05-27 – 2013-06-09 (×14): 5 mg via ORAL
  Filled 2013-05-27 (×17): qty 1

## 2013-05-27 MED ORDER — PANTOPRAZOLE SODIUM 40 MG PO TBEC
40.0000 mg | DELAYED_RELEASE_TABLET | Freq: Two times a day (BID) | ORAL | Status: DC
  Administered 2013-05-27 – 2013-06-10 (×28): 40 mg via ORAL
  Filled 2013-05-27 (×28): qty 1

## 2013-05-27 MED ORDER — SORBITOL 70 % SOLN
30.0000 mL | Freq: Every day | Status: DC | PRN
  Administered 2013-06-02 – 2013-06-03 (×2): 30 mL via ORAL
  Filled 2013-05-27 (×2): qty 30

## 2013-05-27 MED ORDER — DOCUSATE SODIUM 100 MG PO CAPS
100.0000 mg | ORAL_CAPSULE | Freq: Two times a day (BID) | ORAL | Status: DC
  Administered 2013-05-27 – 2013-06-03 (×13): 100 mg via ORAL
  Filled 2013-05-27 (×18): qty 1

## 2013-05-27 MED ORDER — GABAPENTIN 600 MG PO TABS
600.0000 mg | ORAL_TABLET | Freq: Two times a day (BID) | ORAL | Status: DC
  Administered 2013-05-27: 600 mg via ORAL
  Filled 2013-05-27 (×6): qty 1

## 2013-05-27 MED ORDER — TRAZODONE HCL 50 MG PO TABS
100.0000 mg | ORAL_TABLET | Freq: Every evening | ORAL | Status: DC | PRN
  Administered 2013-05-30 – 2013-06-09 (×11): 100 mg via ORAL
  Filled 2013-05-27 (×12): qty 2

## 2013-05-27 MED ORDER — SIMVASTATIN 40 MG PO TABS
40.0000 mg | ORAL_TABLET | Freq: Every day | ORAL | Status: DC
  Administered 2013-05-28 – 2013-06-10 (×14): 40 mg via ORAL
  Filled 2013-05-27 (×14): qty 1

## 2013-05-27 MED ORDER — FLUTICASONE PROPIONATE 50 MCG/ACT NA SUSP
2.0000 | Freq: Every day | NASAL | Status: DC
  Administered 2013-05-27 – 2013-06-09 (×13): 2 via NASAL
  Filled 2013-05-27 (×2): qty 16

## 2013-05-27 MED ORDER — LOSARTAN POTASSIUM 50 MG PO TABS
50.0000 mg | ORAL_TABLET | Freq: Every day | ORAL | Status: DC
  Administered 2013-05-28 – 2013-06-10 (×14): 50 mg via ORAL
  Filled 2013-05-27 (×18): qty 1

## 2013-05-27 MED ORDER — POLYVINYL ALCOHOL 1.4 % OP SOLN: 2.0000 [drp] | Freq: Two times a day (BID) | OPHTHALMIC | Status: DC | PRN

## 2013-05-27 MED ORDER — TIOTROPIUM BROMIDE MONOHYDRATE 18 MCG IN CAPS
18.0000 ug | ORAL_CAPSULE | Freq: Every day | RESPIRATORY_TRACT | Status: DC
  Administered 2013-05-30 – 2013-06-10 (×11): 18 ug via RESPIRATORY_TRACT
  Filled 2013-05-27 (×6): qty 5

## 2013-05-27 MED ORDER — POLYETHYLENE GLYCOL 3350 17 G PO PACK
17.0000 g | PACK | Freq: Every day | ORAL | Status: DC | PRN
  Administered 2013-06-02 – 2013-06-04 (×2): 17 g via ORAL
  Filled 2013-05-27 (×2): qty 1

## 2013-05-27 MED ORDER — ONDANSETRON HCL 4 MG PO TABS
4.0000 mg | ORAL_TABLET | Freq: Four times a day (QID) | ORAL | Status: DC | PRN
  Administered 2013-05-28 – 2013-06-01 (×7): 4 mg via ORAL
  Filled 2013-05-27 (×9): qty 1

## 2013-05-27 MED ORDER — ALPRAZOLAM 0.5 MG PO TABS
0.5000 mg | ORAL_TABLET | Freq: Three times a day (TID) | ORAL | Status: DC | PRN
  Administered 2013-05-29 – 2013-05-30 (×2): 0.5 mg via ORAL
  Filled 2013-05-27 (×2): qty 1

## 2013-05-27 MED ORDER — ACETAMINOPHEN 325 MG PO TABS
650.0000 mg | ORAL_TABLET | Freq: Four times a day (QID) | ORAL | Status: DC | PRN
  Administered 2013-05-29: 650 mg via ORAL
  Filled 2013-05-27 (×3): qty 2

## 2013-05-27 MED ORDER — FLUOXETINE HCL 20 MG PO CAPS
40.0000 mg | ORAL_CAPSULE | Freq: Two times a day (BID) | ORAL | Status: DC
  Administered 2013-05-27 – 2013-06-10 (×28): 40 mg via ORAL
  Filled 2013-05-27 (×31): qty 2

## 2013-05-27 MED ORDER — ENOXAPARIN SODIUM 40 MG/0.4ML ~~LOC~~ SOLN: 40.0000 mg | SUBCUTANEOUS | Status: DC

## 2013-05-27 NOTE — Progress Notes (Signed)
Patient ID: [name], male   DOB: [birth year], 68 y.o.   MRN: [id number]         [facility]    Date of Admission:  05/23/2013     Principal Problem:   Fever Active Problems:   Rheumatoid arthritis(714.0)   Protein-calorie malnutrition, severe   Wound infection after surgery   Unintentional weight loss   PSEUDOGOUT   HYPERTENSION   CAD   COPD (chronic obstructive pulmonary disease)   Acute encephalopathy   Frequent falls   Hyponatremia   SIRS (systemic inflammatory response syndrome)   Dehydration   Chronic narcotic use   Degenerative arthritis of spine   Hemochromatosis   Normocytic anemia   . aspirin EC  81 mg Oral q morning - 10a  . cholecalciferol  1,000 Units Oral Daily  . colchicine  0.6 mg Oral BID  . docusate sodium  100 mg Oral BID  . enoxaparin (LOVENOX) injection  40 mg Subcutaneous Q24H  . feeding supplement (ENSURE COMPLETE)  237 mL Oral TID WC  . FLUoxetine  40 mg Oral BID  . fluticasone  2 spray Each Nare QHS  . gabapentin  600 mg Oral BID  . guaiFENesin  1,200 mg Oral BID  . losartan  50 mg Oral Daily  . metoprolol tartrate  25 mg Oral BID  . OxyCODONE  10 mg Oral Q12H  . pantoprazole  40 mg Oral BID  . predniSONE  5 mg Oral QPM  . simvastatin  40 mg Oral Q lunch  . sodium chloride  3 mL Intravenous Q12H  . tiotropium  18 mcg Inhalation Daily    Subjective: He is feeling better. His dry cough is diminishing.  Past Medical History  Diagnosis Date  . Hx of colonic polyps   . Diverticulosis   . Hypertension   . 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 CAD (coronary artery disease)     minimal coronary plaque in the LAD and right coronary system. 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 7-06. Last nuclear stress 11-17-06 fixed anterior/ inferior defect, no inducible ischemia, EF 81%  . Allergic  rhinitis   . Hx of colonoscopy   . PONV (postoperative nausea and vomiting)   . Dysrhythmia 01-24-12    past hx. A.Fib x1 episode-responded to med.  . Depression   . Anxiety     pt. feels its related to the medicine that he takes   . GERD (gastroesophageal reflux disease)   . COPD (chronic obstructive pulmonary disease)     pt denies this hx on 05/24/2013  . Osteoarthritis   . RA (rheumatoid arthritis)   . Chronic back pain     History  Substance Use Topics  . Smoking status: Never Smoker   . Smokeless tobacco: Never Used  . Alcohol Use: No    Family History  Problem Relation Age of Onset  . Uterine cancer Mother     survivor  . Macular degeneration Mother   . Other Mother     ankle edema  . Lung cancer Mother   . Cancer Mother     ovarian, lung  . Coronary artery disease Father   . Hypertension Father   . Prostate cancer Father   . Colon polyps Father   . Cancer Father     prostate  . Thyroid disease Sister   .  Heart attack Brother   . Hyperlipidemia Brother   . Other Brother     Schizophrenic  . Diabetes Neg Hx   . Colon cancer Neg Hx   . Esophageal cancer Neg Hx   . Rectal cancer Neg Hx   . Stomach cancer Neg Hx   . Coronary artery disease Maternal Aunt   . Heart attack Maternal Aunt   . Rheum arthritis Sister   . Hemochromatosis Sister     Allergies  Allergen Reactions  . Morphine And Related Shortness Of Breath, Nausea And Vomiting, Swelling and Other (See Comments)    Agitation, tolerates dilaudid  . Penicillins Hives, Shortness Of Breath and Rash    REACTION: hives, breathing problems  . Cefixime     rash  . Percocet [Oxycodone-Acetaminophen] Anxiety    Takes OxyContin at home.    Objective: Temp:  [97.8 F (36.6 C)-98.9 F (37.2 C)] 98.9 F (37.2 C) (02/19 0509) Pulse Rate:  [69-79] 69 (02/19 0509) Resp:  [18] 18 (02/19 0509) BP: (128-146)/(76-97) 128/78 mmHg (02/19 0509) SpO2:  [95 %-98 %] 98 % (02/19 0509) Weight:  [61.75 kg (136 lb  2.1 oz)] 61.75 kg (136 lb 2.1 oz) (02/19 0509)  General: He is currently standing at the sink shaving Skin: No acute rash Lungs: Clear Cor: Regular S1 and S2 with no murmurs Abdomen:  nontender Lumbar incision healing slowly without evidence of infection  Lab Results Lab Results  Component Value Date   WBC 8.1 05/24/2013   HGB 11.8* 05/24/2013   HCT 34.2* 05/24/2013   MCV 81.8 05/24/2013   PLT 342 05/24/2013    Lab Results  Component Value Date   CREATININE 0.58 05/27/2013   BUN 7 05/27/2013   NA 132* 05/27/2013   K 4.2 05/27/2013   CL 96 05/27/2013   CO2 28 05/27/2013    Lab Results  Component Value Date   ALT 13 05/23/2013   AST 28 05/23/2013   ALKPHOS 85 05/23/2013   BILITOT 1.0 05/23/2013      Lab Results  Component Value Date   CRP 0.9* 05/26/2013   Lab Results  Component Value Date   ESRSEDRATE 8 05/26/2013   Microbiology: Recent Results (from the past 240 hour(s))  CULTURE, BLOOD (SINGLE)     Status: None   Collection Time    05/18/13  4:01 PM      Result Value Ref Range Status   Organism ID, Bacteria NO GROWTH 5 DAYS   Final  CULTURE, BLOOD (SINGLE)     Status: None   Collection Time    05/18/13  4:01 PM      Result Value Ref Range Status   Organism ID, Bacteria NO GROWTH 5 DAYS   Final  CULTURE, BLOOD (ROUTINE X 2)     Status: None   Collection Time    05/23/13  8:21 PM      Result Value Ref Range Status   Specimen Description BLOOD LEFT ARM   Final   Special Requests BOTTLES DRAWN AEROBIC AND ANAEROBIC 10CC EA   Final   Culture  Setup Time     Final   Value: 05/24/2013 01:43     Performed at [facility]   Culture     Final   Value:        BLOOD CULTURE RECEIVED NO GROWTH TO DATE CULTURE WILL BE HELD FOR 5 DAYS BEFORE ISSUING A FINAL NEGATIVE REPORT     Performed at [facility]  Report Status PENDING   Incomplete  CULTURE, BLOOD (ROUTINE X 2)     Status: None   Collection Time    05/23/13  8:26 PM      Result Value Ref Range Status    Specimen Description BLOOD LEFT HAND   Final   Special Requests BOTTLES DRAWN AEROBIC ONLY 10CC   Final   Culture  Setup Time     Final   Value: 05/24/2013 01:43     Performed at [facility]   Culture     Final   Value:        BLOOD CULTURE RECEIVED NO GROWTH TO DATE CULTURE WILL BE HELD FOR 5 DAYS BEFORE ISSUING A FINAL NEGATIVE REPORT     Performed at [facility]   Report Status PENDING   Incomplete  URINE CULTURE     Status: None   Collection Time    05/23/13  8:43 PM      Result Value Ref Range Status   Specimen Description URINE, RANDOM   Final   Special Requests NONE   Final   Culture  Setup Time     Final   Value: 05/23/2013 21:44     Performed at [facility]     Final   Value: 25,000 COLONIES/ML     Performed at [facility]   Culture     Final   Value: Multiple bacterial morphotypes present, none predominant. Suggest appropriate recollection if clinically indicated.     Performed at [facility]   Report Status 05/24/2013 FINAL   Final    Studies/Results: Dg Chest 2 View  05/26/2013   CLINICAL DATA:  Shortness of breath and cough.  EXAM: CHEST  2 VIEW  COMPARISON:  DG CHEST 1V PORT dated 05/23/2013; DG CHEST 2 VIEW dated 05/08/2013; DG CHEST 2 VIEW dated 04/12/2013; CT ABD/PELV WO CM dated 08/11/2012  FINDINGS: Mediastinum unremarkable. Poor inspiration with basilar atelectasis. Prominent elevation of the right hemidiaphragm again noted. A catheter or coronary stent noted over the left heart. No pneumothorax. Bilateral shoulder replacements. Prior lumbar spine fusion.  IMPRESSION: Stable chest x-ray with poor inspiration and basilar atelectasis. Stable elevation right hemidiaphragm.   Electronically Signed   By: Marcello Moores  Register   On: 05/26/2013 17:10    Assessment: I suspect his recent fever was due to an upper respiratory infection that is resolving. Continue observation off of antibiotics for now. I think his lumbar  infection and was premature and I would recommend restarting Humira.  Plan: 1. Observe off of antibiotics 2. Recommend restarting Humira at any time  Michel Bickers, MD [facility] for [place] [phone] pager   [phone] cell 05/27/2013, 10:31 AM

## 2013-05-27 NOTE — Progress Notes (Signed)
Received pt. As a transfer from [facility].Pt. Is alert and oriented,from home with family.No home equipment at bedside.Pt. Oriented to unit,safety plan revise and contract was sign.Pt. Has an open incision with dressing change instructions for M,W,F.Keep monitoring pt. Closely and assessing his needs.

## 2013-05-27 NOTE — H&P (Signed)
Physical Medicine and Rehabilitation Admission H&P      Chief Complaint   Patient presents with   .  Nausea    :   Chief complaint: Back pain   HPI: [name] is a 68 y.o. right-handed male with history of rheumatoid arthritis with chronic prednisone, lumbar L4-5 fusion 2010. Admitted 03/02/2013 underwent L1-S1 decompression and fusion with course complicated by hypotension requiring pressor support. He was discharged to skilled nursing facility 03/10/2013 and returned to [facility] 03/16/2013 with fever 102 and wound drainage. On 03/17/2013 he returned to the operating room for debridement of his wound. A wound swab grew P aeruginosa (pan sens). He was discharged home with assistance from his wife. Patient presented 05/23/2013 with nausea vomiting poor appetite low-grade fever. He had been using a wheelchair rolling walker for short distances while at home. Recent CT of the head showed no acute abnormalities. Chest x-ray negative. Urine culture 25,000 multi- species. Mild hyponatremia 126. Placed on broad-spectrum antibiotics with Primaxin as well as vancomycin and followup infectious disease with all antibiotics since discontinued. Lumbar wound continues to heal nicely with followup per wound care nurse. Subcutaneous Lovenox added for DVT prophylaxis. Physical and occupational therapy evaluation 05/24/2013 and OT evaluation 05/25/2013 noted deconditioning and recommendations of physical medicine rehabilitation consult. Patient was admitted for comprehensive rehabilitation program  Pt is sleeping awakens and states he's seen me last in grocery store.  (Saw pt in hospital yesterday)   ROS Review of Systems   Respiratory: Positive for shortness of breath.   Gastrointestinal:   GERD   Musculoskeletal: Positive for back pain, joint pain and myalgias.   Psychiatric/Behavioral: Positive for depression and memory loss.   Anxiety   Hoarseness of voice after most recent back  surgery with followup ENT(Dr. Simeon Craft) with normal scope/laryngoscopic exam All other systems reviewed and are negative    Past Medical History   Diagnosis  Date   .  Hx of colonic polyps     .  Diverticulosis     .  Hypertension     .  Hemochromatosis         dx'd 14 yrs ago last ferritin Aug 11, 08 52 (22-322), Fe 136   .  CAD (coronary artery disease)         minimal coronary plaque in the LAD and right coronary system. 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 7-06. Last nuclear stress 11-17-06 fixed anterior/ inferior defect, no inducible ischemia, EF 81%   .  Allergic rhinitis     .  Hx of colonoscopy     .  PONV (postoperative nausea and vomiting)     .  Dysrhythmia  01-24-12       past hx. A.Fib x1 episode-responded to med.   .  Depression     .  Anxiety         pt. feels its related to the medicine that he takes    .  GERD (gastroesophageal reflux disease)     .  COPD (chronic obstructive pulmonary disease)         pt denies this hx on 05/24/2013   .  Osteoarthritis     .  RA (rheumatoid arthritis)     .  Chronic back pain      Past Surgical History   Procedure  Laterality  Date   .  Appendectomy       .  Total knee arthroplasty  Right  2002        partial   .  Shoulder hemi-arthroplasty  Left  2008       partial   .  Foot surgery  Right  11-08       for removal of bone spurs-   .  Ankle reconstruction  Right  6-09       [facility]   .  Posterior lumbar fusion    12-10       L4-5 diskectomy w/ fusion, cage placement and rods; [facility]   .  Partial knee arthroplasty  Left     .  Total shoulder replacement  Right     .  Joint replacement       .  Cataract extraction, bilateral  Bilateral  01-24-12   .  Orif shoulder fracture    02/06/2012       Procedure: OPEN REDUCTION INTERNAL FIXATION (ORIF) SHOULDER FRACTURE;  Surgeon: Nita Sells, MD;  Location: [facility];  Service: Orthopedics;  Laterality: Left;   ORIF of a Left Shoulder Fracture with  Iliac Crest Bone Graft aspiration    .  Harvest bone graft    02/06/2012       Procedure: HARVEST ILIAC BONE GRAFT;  Surgeon: Nita Sells, MD;  Location: [facility];  Service: Orthopedics;;  bone marrow aspirqation    .  Hardware removal    03/09/2012       Procedure: HARDWARE REMOVAL;  Surgeon: Nita Sells, MD;  Location: [place];  Service: Orthopedics;  Laterality: Left;  Hardware Removal from Left Shoulder   .  Shoulder arthroscopy  Right  2013   .  Carpal tunnel release  Right  1990's   .  Total ankle replacement  Right  2008       at [facility]   .  Hammer toe surgery  Right  07/2012       "broke 4 hammertoes"    .  Posterior lumbar fusion 4 level  N/A  03/02/2013       Procedure: Lumbar One to Sacral One Posterior lumbar interbody fusion;  Surgeon: Floyce Stakes, MD;  Location: [place] [facility];  Service: Neurosurgery;  Laterality: N/A;  L1 to S1 Posterior lumbar interbody fusion   .  Lumbar wound debridement  N/A  03/17/2013       Procedure: Incision and drainage of superficial lumbar wound;  Surgeon: Floyce Stakes, MD;  Location: [place] [facility];  Service: Neurosurgery;  Laterality: N/A;  Incision and drainage of superficial lumbar wound   .  Tonsillectomy       .  Hernia repair  Bilateral     .  Coronary angioplasty with stent placement    2006       "while repairing 1st stent, a second area tore and they had to place 2nd stent " ?LAD & CX    Family History   Problem  Relation  Age of Onset   .  Uterine cancer  Mother         survivor   .  Macular degeneration  Mother     .  Other  Mother         ankle edema   .  Lung cancer  Mother     .  Cancer  Mother         ovarian, lung   .  Coronary artery disease  Father     .  Hypertension  Father     .  Prostate cancer  Father     .  Colon polyps  Father     .  Cancer  Father         prostate   .  Thyroid disease  Sister     .  Heart attack  Brother     .   Hyperlipidemia  Brother     .  Other  Brother         Schizophrenic   .  Diabetes  Neg Hx     .  Colon cancer  Neg Hx     .  Esophageal cancer  Neg Hx     .  Rectal cancer  Neg Hx     .  Stomach cancer  Neg Hx     .  Coronary artery disease  Maternal Aunt     .  Heart attack  Maternal Aunt     .  Rheum arthritis  Sister     .  Hemochromatosis  Sister      Social History: reports that he has never smoked. He has never used smokeless tobacco. He reports that he does not drink alcohol or use illicit drugs. Allergies:   Allergies   Allergen  Reactions   .  Morphine And Related  Shortness Of Breath, Nausea And Vomiting, Swelling and Other (See Comments)       Agitation, tolerates dilaudid   .  Penicillins  Hives, Shortness Of Breath and Rash       REACTION: hives, breathing problems   .  Cefixime         rash   .  Percocet [Oxycodone-Acetaminophen]  Anxiety       Takes OxyContin at home.    Medications Prior to Admission   Medication  Sig  Dispense  Refill   .  Aclidinium Bromide (TUDORZA PRESSAIR) 400 MCG/ACT AEPB  Inhale 1 puff into the lungs 2 (two) times daily.   1 each   11   .  Aclidinium Bromide 400 MCG/ACT AEPB  Inhale 2 puffs into the lungs at bedtime.         .  ALPRAZolam (XANAX) 0.5 MG tablet  Take 1 tablet (0.5 mg total) by mouth 3 (three) times daily as needed for anxiety or sleep.   90 tablet   5   .  aspirin (ASPIRIN EC) 81 MG EC tablet  Take 81 mg by mouth every morning.          .  celecoxib (CELEBREX) 200 MG capsule  Take 200 mg by mouth 2 (two) times daily.         .  cholecalciferol (VITAMIN D) 1000 UNITS tablet  Take 1 tablet (1,000 Units total) by mouth daily.   100 tablet   3   .  colchicine 0.6 MG tablet  Take 0.6 mg by mouth 2 (two) times daily.         [name]  docusate sodium (COLACE) 100 MG capsule  Take 1 capsule (100 mg total) by mouth 2 (two) times daily.   60 capsule   0   .  feeding supplement, ENSURE COMPLETE, (ENSURE COMPLETE) LIQD  Take 237 mLs by mouth  daily.   237 Bottle   1   .  FLUoxetine (PROZAC) 40 MG capsule  Take 40 mg by mouth 2 (two) times daily.         .  fluticasone (FLONASE) 50 MCG/ACT nasal spray  Place 2 sprays into both nostrils at bedtime.          .  furosemide (LASIX) 20 MG tablet  Take 20 mg by mouth daily.         [name]  gabapentin (NEURONTIN) 300 MG capsule  Take 300-600 mg by mouth 4 (four) times daily. 600 mg every morning, 300 mg at noon, 600 mg at bedtime, and 300 mg at 1 AM         .  hydrochlorothiazide (HYDRODIURIL) 25 MG tablet  Take 25 mg by mouth daily before breakfast.          .  HYDROmorphone (DILAUDID) 4 MG tablet  Take 1 tablet (4 mg total) by mouth every 8 (eight) hours as needed for moderate pain or severe pain. MAY FILL ON OR AFTER 07/16/13   90 tablet   0   .  losartan (COZAAR) 50 MG tablet  Take 50 mg by mouth daily.         .  Melatonin ([employer] MELATONIN) 5 MG TABS  Take 5 mg by mouth at bedtime.          .  Nutritional Supplements (ENSURE COMPLETE PO)  Take 0.5 Cans by mouth 2 (two) times daily.         .  OxyCODONE (OXYCONTIN) 10 mg T12A 12 hr tablet  Take 10 mg by mouth every 12 (twelve) hours.         .  OxyCODONE (OXYCONTIN) 20 mg T12A 12 hr tablet  Take 20 mg by mouth every 12 (twelve) hours.         .  OxyCODONE (OXYCONTIN) 40 mg T12A 12 hr tablet  Take 1 tablet (40 mg total) by mouth 2 (two) times daily as needed (pain). MAY FILL ON OR AFTER 07/16/2013   60 tablet   0   .  pantoprazole (PROTONIX) 40 MG tablet  Take 40 mg by mouth 2 (two) times daily.          Vladimir Faster Glycol-Propyl Glycol (SYSTANE ULTRA) 0.4-0.3 % SOLN  Place 2 drops into both eyes 2 (two) times daily as needed (dry eyes).          .  polyethylene glycol (MIRALAX / GLYCOLAX) packet  Take 17 g by mouth daily as needed for moderate constipation.   14 each   1   .  potassium chloride SA (K-DUR,KLOR-CON) 20 MEQ tablet  Take 20 mEq by mouth daily.         .  predniSONE (DELTASONE) 5 MG tablet  Take 5 mg by mouth every evening.          .   simvastatin (ZOCOR) 40 MG tablet  Take 40 mg by mouth daily with lunch.          .  traZODone (DESYREL) 50 MG tablet  Take 50 mg by mouth at bedtime as needed for sleep (take if the 100 mg dose is not effective.).            Home: Home Living Family/patient expects to be discharged to:: Private residence Living Arrangements: Spouse/significant other;Other (Comment) (Debbie and pt living together for 5 yrs; not married) Available Help at Discharge: Other (Comment) (Pt's daughter in law available 24/7 to assist; [name] works ) Type of Home: House Home Access: Stairs to enter [employer] of Steps: 5 Entrance Stairs-Rails: [place]: Two level;Able to live on main level with bedroom/bathroom Home Equipment: Gilford Rile - 2 wheels;Cane - single  point;Shower seat;Hand held shower head;Wheelchair - manual;Hospital bed  Lives With: Significant other    Functional History: Prior Function Comments: Pt has been walking short distances with a RW, but reports daily falls due to leg weakness.    Functional Status:   Mobility: Ambulation/Gait Ambulation Distance (Feet): 200 Feet Gait velocity: decreased General Gait Details: Pt continues to have increased left leg weakness with increased gait distance and fatigue.  Min assist to support trunk for balance during gait, especially when turning or navigating tighter obstables.  Verbal cues for safe use of RW and upright posture.    ADL: ADL Eating/Feeding: Independent Where Assessed - Eating/Feeding: Chair Grooming: Set up Where Assessed - Grooming: Supported sitting Upper Body Bathing: Minimal assistance Where Assessed - Upper Body Bathing: Supported sitting Lower Body Bathing: Maximal assistance Where Assessed - Lower Body Bathing: Supported sit to stand Upper Body Dressing: Minimal assistance Where Assessed - Upper Body Dressing: Supported sitting Lower Body Dressing: Moderate assistance Where Assessed - Lower Body  Dressing: Supported sit to Lobbyist: Minimal assistance Toilet Transfer Method: Sit to stand;Stand pivot Toilet Transfer Equipment: Comfort height toilet Equipment Used: Gait belt;Rolling walker Transfers/Ambulation Related to ADLs: minA ADL Comments: Pt given theratubing to build up utensils and toothbrush, etc. also given build up foam grip for pen.Educated family on use. Tubing helps with sefl feeding. discussed other types of AE.   Cognition: Cognition Overall Cognitive Status: Within Functional Limits for tasks assessed Arousal/Alertness: Awake/alert Orientation Level: Oriented X4 Cognition Arousal/Alertness: Awake/alert Behavior During Therapy: WFL for tasks assessed/performed Overall Cognitive Status: Within Functional Limits for tasks assessed   Physical Exam: Blood pressure 128/78, pulse 69, temperature 98.9 F (37.2 C), temperature source Oral, resp. rate 18, height 5' 6"  (1.676 m), weight 61.75 kg (136 lb 2.1 oz), SpO2 98.00%. Physical Exam Vitals reviewed.   HENT:   Head: Normocephalic.   Eyes: EOM are normal.   Neck: Normal range of motion. Neck supple. No thyromegaly present.   Cardiovascular: Normal rate and regular rhythm.   Respiratory: Effort normal and breath sounds normal. No respiratory distress.   GI: Soft. Bowel sounds are normal. He exhibits no distension.  Neurological: He is alert.  Patient was able to state his name and age. He was a poor medical historian  , place is [facility] Skin:  Back incision is clean and dry   granulation tissue noted, lumbar incision approximately 1 cm deep without drainage   Motor strength is 4/5 bilateral deltoid, bicep, tricep, grip, hip flexor, knee extensors, ankle dorsiflexor plantar flexion   Sensory intact to light touch in bilateral upper and lower limb    Results for orders placed during the hospital encounter of 05/23/13 (from the past 48 hour(s))   GLUCOSE, CAPILLARY     Status: Abnormal      Collection Time      05/25/13  7:46 AM       Result  Value  Ref Range     Glucose-Capillary  102 (*)  70 - 99 mg/dL     Comment 1  Documented in Chart        Comment 2  Notify RN      BASIC METABOLIC PANEL     Status: Abnormal     Collection Time      05/25/13  7:49 AM       Result  Value  Ref Range     Sodium  132 (*)  137 - 147 mEq/L  Comment:  DELTA CHECK NOTED     Potassium  3.4 (*)  3.7 - 5.3 mEq/L     Chloride  99   96 - 112 mEq/L     Comment:  DELTA CHECK NOTED     CO2  22   19 - 32 mEq/L     Glucose, Bld  103 (*)  70 - 99 mg/dL     BUN  9   6 - 23 mg/dL     Creatinine, Ser  0.47 (*)  0.50 - 1.35 mg/dL     Calcium  7.9 (*)  8.4 - 10.5 mg/dL     GFR calc non Af Amer  >90   >90 mL/min     GFR calc Af Amer  >90   >90 mL/min     Comment:  (NOTE)        The eGFR has been calculated using the CKD EPI equation.        This calculation has not been validated in all clinical situations.        eGFR's persistently <90 mL/min signify possible Chronic Kidney        Disease.   GLUCOSE, CAPILLARY     Status: Abnormal     Collection Time      05/25/13 12:07 PM       Result  Value  Ref Range     Glucose-Capillary  144 (*)  70 - 99 mg/dL     Comment 1  Documented in Chart        Comment 2  Notify RN      GLUCOSE, CAPILLARY     Status: Abnormal     Collection Time      05/25/13  4:56 PM       Result  Value  Ref Range     Glucose-Capillary  123 (*)  70 - 99 mg/dL     Comment 1  Documented in Chart        Comment 2  Notify RN      GLUCOSE, CAPILLARY     Status: Abnormal     Collection Time      05/25/13  8:22 PM       Result  Value  Ref Range     Glucose-Capillary  124 (*)  70 - 99 mg/dL   BASIC METABOLIC PANEL     Status: Abnormal     Collection Time      05/26/13  6:25 AM       Result  Value  Ref Range     Sodium  130 (*)  137 - 147 mEq/L     Potassium  3.6 (*)  3.7 - 5.3 mEq/L     Chloride  98   96 - 112 mEq/L     CO2  25   19 - 32 mEq/L     Glucose, Bld  91   70 - 99  mg/dL     BUN  7   6 - 23 mg/dL     Creatinine, Ser  0.58   0.50 - 1.35 mg/dL     Calcium  7.8 (*)  8.4 - 10.5 mg/dL     GFR calc non Af Amer  >90   >90 mL/min     GFR calc Af Amer  >90   >90 mL/min     Comment:  (NOTE)        The eGFR has been calculated using the CKD EPI equation.  This calculation has not been validated in all clinical situations.        eGFR's persistently <90 mL/min signify possible Chronic Kidney        Disease.   GLUCOSE, CAPILLARY     Status: None     Collection Time      05/26/13  7:37 AM       Result  Value  Ref Range     Glucose-Capillary  85   70 - 99 mg/dL   GLUCOSE, CAPILLARY     Status: None     Collection Time      05/26/13 10:59 AM       Result  Value  Ref Range     Glucose-Capillary  92   70 - 99 mg/dL   SEDIMENTATION RATE     Status: None     Collection Time      05/26/13  3:33 PM       Result  Value  Ref Range     Sed Rate  8   0 - 16 mm/hr   C-REACTIVE PROTEIN     Status: Abnormal     Collection Time      05/26/13  3:33 PM       Result  Value  Ref Range     CRP  0.9 (*)  <0.60 mg/dL     Comment:  Performed at [facility], CAPILLARY     Status: Abnormal     Collection Time      05/26/13  5:38 PM       Result  Value  Ref Range     Glucose-Capillary  124 (*)  70 - 99 mg/dL   GLUCOSE, CAPILLARY     Status: Abnormal     Collection Time      05/26/13  8:01 PM       Result  Value  Ref Range     Glucose-Capillary  112 (*)  70 - 99 mg/dL   BASIC METABOLIC PANEL     Status: Abnormal     Collection Time      05/27/13  4:06 AM       Result  Value  Ref Range     Sodium  132 (*)  137 - 147 mEq/L     Potassium  4.2   3.7 - 5.3 mEq/L     Chloride  96   96 - 112 mEq/L     CO2  28   19 - 32 mEq/L     Glucose, Bld  92   70 - 99 mg/dL     BUN  7   6 - 23 mg/dL     Creatinine, Ser  0.58   0.50 - 1.35 mg/dL     Calcium  7.8 (*)  8.4 - 10.5 mg/dL     GFR calc non Af Amer  >90   >90 mL/min     GFR calc Af Amer  >90   >90  mL/min     Comment:  (NOTE)        The eGFR has been calculated using the CKD EPI equation.        This calculation has not been validated in all clinical situations.        eGFR's persistently <90 mL/min signify possible Chronic Kidney        Disease.    Dg Chest 2 View   05/26/2013   CLINICAL DATA:  Shortness of breath and cough.  EXAM: CHEST  2 VIEW  COMPARISON:  DG CHEST 1V PORT dated 05/23/2013; DG CHEST 2 VIEW dated 05/08/2013; DG CHEST 2 VIEW dated 04/12/2013; CT ABD/PELV WO CM dated 08/11/2012  FINDINGS: Mediastinum unremarkable. Poor inspiration with basilar atelectasis. Prominent elevation of the right hemidiaphragm again noted. A catheter or coronary stent noted over the left heart. No pneumothorax. Bilateral shoulder replacements. Prior lumbar spine fusion.  IMPRESSION: Stable chest x-ray with poor inspiration and basilar atelectasis. Stable elevation right hemidiaphragm.   Electronically Signed   By: Marcello Moores  Register   On: 05/26/2013 17:10     Post Admission Physician Evaluation: Functional deficits secondary  to Deconditioning s/p L1-S1 fusion for spinal stenosis. Patient is admitted to receive collaborative, interdisciplinary care between the physiatrist, rehab nursing staff, and therapy team. Patient's level of medical complexity and substantial therapy needs in context of that medical necessity cannot be provided at a lesser intensity of care such as a SNF. Patient has experienced substantial functional loss from his/her baseline which was documented above under the "Functional History" and "Functional Status" headings.  Judging by the patient's diagnosis, physical exam, and functional history, the patient has potential for functional progress which will result in measurable gains while on inpatient rehab.  These gains will be of substantial and practical use upon discharge  in facilitating mobility and self-care at the household level. Physiatrist will provide 24 hour management of  medical needs as well as oversight of the therapy plan/treatment and provide guidance as appropriate regarding the interaction of the two. 24 hour rehab nursing will assist with bowel management, safety, skin/wound care, disease management, medication administration, pain management and patient education  and help integrate therapy concepts, techniques,education, etc. PT will assess and treat for/with: pre gait, gait training, endurance , safety, equipment, neuromuscular re education.   Goals are: Sup/mod I. OT will assess and treat for/with: ADLs, Cognitive perceptual skills, Neuromuscular re education, safety, endurance, equipment.   Goals are: Sup / Mod I. SLP will assess and treat for/with: NA.  Goals are: NA. Case Management and Social Worker will assess and treat for psychological issues and discharge planning. Team conference will be held weekly to assess progress toward goals and to determine barriers to discharge. Patient will receive at least 3 hours of therapy per day at least 5 days per week. ELOS: 8-10 days        Prognosis:  good     Medical Problem List and Plan: 1. 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 Patient with history of multiple back surgeries 2. DVT Prophylaxis/Anticoagulation: Subcutaneous Lovenox. Monitor platelet counts and any signs of bleeding 3. Pain Management: Neurontin 600 mg twice a day, OxyContin sustained-release 10 mg every 12 hours, Dilaudid 4 mg every 8 hours moderate to severe pain 4. Mood/depression. Prozac 40 mg twice a day, Xanax 0.5 mg 3 times a day as needed. Provide emotional support and 5. Neuropsych: This patient is not  capable of making decisions on his own behalf. 6. Wound. Followup wound care nurse with dressings to back wound as directed 7. Hoarseness of voice after recent back surgery. Followup ENT Dr. Thera Flake. No normal scope/laryngoscopic exam. Continued followup speech therapy 8. Rheumatoid  arthritis. Chronic prednisone therapy. Question plan to resume Humira 40 mg subcutaneous every 14 days as prior to admission 9. Hypertension. Cozaar 50 mg daily, Lopressor 25 mg twice a day. Monitor with increased activity 10. Hyperlipidemia. Zocor. 11. COPD. Spiriva daily, albuterol nebulizer every 2 hours as needed. Check oxygen saturations  every shift  Charlett Blake M.D. [facility] FAAPM&[id number] (Sports Med, Neuromuscular Med) Diplomate Am Board of Electrodiagnostic Med  05/27/2013

## 2013-05-27 NOTE — Progress Notes (Signed)
I will admit pt to inpt rehab today. [phone]

## 2013-05-27 NOTE — Discharge Summary (Signed)
Physician Discharge Summary  [name] KGM:[id number] DOB: Jan 01, 1946 DOA: 05/23/2013  PCP: Illene Regulus, MD  Admit date: 05/23/2013 Discharge date: 05/27/2013  Time spent: 40 minutes  Recommendations for Outpatient Follow-up:  1. Followup with CIR staff. 2. Followup with primary care physician within 3 weeks.  Discharge Diagnoses:  Principal Problem:   Fever Active Problems:   PSEUDOGOUT   HYPERTENSION   CAD   Rheumatoid arthritis(714.0)   COPD (chronic obstructive pulmonary disease)   Acute encephalopathy   Frequent falls   Hyponatremia   SIRS (systemic inflammatory response syndrome)   Dehydration   Chronic narcotic use   Protein-calorie malnutrition, severe   Degenerative arthritis of spine   Hemochromatosis   Wound infection after surgery   Normocytic anemia   Unintentional weight loss   Discharge Condition: Stable  Diet recommendation: Heart is  Filed Weights   05/25/13 0535 05/26/13 0524 05/27/13 0509  Weight: 58.3 kg (128 lb 8.5 oz) 60.873 kg (134 lb 3.2 oz) 61.75 kg (136 lb 2.1 oz)    History of present illness:  [name] is a 68 y.o. male with a Past Medical History of , recent back surgery complicated by a wound infection, malnutrition, diarrhea, laryngitis, multiple hospitalizations over the past few months was discharged almost 2 weeks back who presents today with the above noted complaint. Patient is a very poor historian, most of this history is obtained from the patient's son at bedside. Upon discharge from this facility approximately 2 weeks back, patient has not been feeling well. His girlfriend has flulike symptoms for the past few days, patient for the past few days has gotten progressively weaker, he has not been ambulating and not eating well. Per son, patient has had nausea and vomiting. Sometimes it the patient has had numerous frequent falls since discharge. Patient also complains of a dry cough. Since the patient is essentially  bedbound and requires significant amount of care which cannot be provided by the patient's girlfriend who is now sick, patient was brought to the ED for further evaluation. In the emergency room, patient was found to be pleasantly confused, febrile with a fever of 102.5, sodium levels of 126. I was subsequently asked to admit this patient for further evaluation and treatment.  Per patient's son, patient was so weak today that patient could not even walk and stand up, the patient's family had to literally carry the patient to the car and bring him to the emergency room.  Slightly confused, patient does answer some of the questions appropriately.  He claims that he's been incontinent of both stool and urine for the past 5 months. He denies any ongoing headaches, chest pain or shortness of breath. He claims he has been weak, with no appetite has not walked since discharge. He claims that physical therapy has only come out to his house once so far.  Hospital Course:   1. Fever: Fever of unknown origin, patient is on chronic prednisone of 5 mg, he was in [place] for rheumatoid arthritis till last September. He did have history of infected back fusion surgery with Pseudomonas. At the time of admission he had clear chest x-ray, negative urinalysis and negative influenza PCR. Blood cultures also are no growth to date. Patient started empirically on vancomycin and cefepime. Infectious disease was consulted because of history of wound infection with Pseudomonas as well as UTI. And they recommended to discontinue antibiotics and monitor off of it. Patient did not have any fever or chills since last  night. He is doing okay he is going to be discharged to [facility].  2. Hyponatremia: This is acute on chronic hyponatremia, the patient usually has lower low normal sodium since 2013, it might be secondary to that she sees is therapy, at the time of admission patient started on gentle IV hydration. Urine  sodium is 84 indicating no sodium avidity by the kidneys without dehydration. Sodium today is 132 with normal kidney function at the time of discharge.  3. Acute bronchitis patient was on cefepime and vancomycin, was on Mucinex and Robitussin-DM. He is doing okay he got treatment for 3 days, ID recommended to discontinue antibiotics.  4. Acute encephalopathy: Patient presents to the hospital with toxic metabolic encephalopathy likely secondary to the fever is also patient is on multiple medication can cause lethargy and confusion including narcotics, benzodiazepines and Neurontin. I decreased his MS Contin to 10 mg twice a day he tolerated that very well, I tried to decrease his Neurontin, patient is very resistant to that and he said he's been taking this for the past 20 years and he does not want this to be changed.  5. Dehydration: Likely secondary to decreased oral intake and effect of diuretics including Lasix and hydrochlorothiazide. These were held at the time of admission and patient hydrated with IV fluids. At the time of discharge hydrochlorothiazide and Lasix restarted.  6. Rheumatoid arthritis: Patient is on chronic prednisone 5 mg, he was on Humira till last September. Dr. Orvan Falconer of infectious disease recommended to restart Humira when possible because of patient's rheumatoid arthritis flareup can cause him to be bed confined with more pain, more susceptibility to infections like pneumonia and UTIs.  7. Chronic narcotic use: As mentioned above MS Contin decreased to 10 mg, patient refused to decrease Neurontin dose.  8. Disposition: Patient will go to CIR for further therapy.  Procedures:  None  Consultations:  Infectious disease  Discharge Exam: Filed Vitals:   05/27/13 0509  BP: 128/78  Pulse: 69  Temp: 98.9 F (37.2 C)  Resp: 18   General: Alert and awake, oriented x3, not in any acute distress. HEENT: anicteric sclera, pupils reactive to light and accommodation,  EOMI CVS: S1-S2 clear, no murmur rubs or gallops Chest: clear to auscultation bilaterally, no wheezing, rales or rhonchi Abdomen: soft nontender, nondistended, normal bowel sounds, no organomegaly Extremities: no cyanosis, clubbing or edema noted bilaterally Neuro: Cranial nerves II-XII intact, no focal neurological deficits  Discharge Instructions  Discharge Orders   Future Appointments Provider Department Dept Phone   05/31/2013 9:30 AM Barron Alvine, CCC-SLP [facility] [phone]   05/31/2013 10:15 AM Kerry Fort, PT [facility] [phone]   06/01/2013 8:45 AM Kerry Fort, PT [facility] [phone]   06/07/2013 11:00 AM Barron Alvine, CCC-SLP [facility] [phone]   06/07/2013 11:45 AM Newell Coral, [place] [facility] [phone]   06/08/2013 12:30 PM Ellen Henri, CCC-SLP [facility] [phone]   06/08/2013 1:15 PM Newell Coral, [place] [facility] [phone]   06/15/2013 1:15 PM Barron Alvine, CCC-SLP [facility] [phone]   06/15/2013 2:00 PM Kerry Fort, [place] [facility] [phone]   06/17/2013 1:15 PM Kerry Fort, PT [facility] [phone]   06/17/2013 2:00 PM Nona Dell [facility] [phone]   07/16/2013 12:00 PM Huston Foley, MD [facility] [phone]   Future  Orders Complete By Expires   Diet - low sodium heart healthy  As directed    Increase activity slowly  As directed        Medication List         Aclidinium Bromide 400 MCG/ACT Aepb   Inhale 2 puffs into the lungs at bedtime.     Aclidinium Bromide 400 MCG/ACT Aepb  Commonly known as:  TUDORZA PRESSAIR  Inhale 1 puff into the lungs 2 (two) times daily.     ALPRAZolam 0.5 MG tablet  Commonly known as:  XANAX  Take 1 tablet (0.5 mg total) by mouth 3 (three) times daily as needed for anxiety or sleep.     aspirin EC 81 MG EC tablet  Generic drug:  aspirin  Take 81 mg by mouth every morning.     celecoxib 200 MG capsule  Commonly known as:  CELEBREX  Take 200 mg by mouth 2 (two) times daily.     cholecalciferol 1000 UNITS tablet  Commonly known as:  VITAMIN D  Take 1 tablet (1,000 Units total) by mouth daily.     colchicine 0.6 MG tablet  Take 0.6 mg by mouth 2 (two) times daily.     [employer] MELATONIN 5 MG Tabs  Generic drug:  Melatonin  Take 5 mg by mouth at bedtime.     docusate sodium 100 MG capsule  Commonly known as:  COLACE  Take 1 capsule (100 mg total) by mouth 2 (two) times daily.     ENSURE COMPLETE PO  Take 0.5 Cans by mouth 2 (two) times daily.     feeding supplement (ENSURE COMPLETE) Liqd  Take 237 mLs by mouth daily.     FLUoxetine 40 MG capsule  Commonly known as:  PROZAC  Take 40 mg by mouth 2 (two) times daily.     fluticasone 50 MCG/ACT nasal spray  Commonly known as:  FLONASE  Place 2 sprays into both nostrils at bedtime.     furosemide 20 MG tablet  Commonly known as:  LASIX  Take 20 mg by mouth daily.     gabapentin 300 MG capsule  Commonly known as:  NEURONTIN  Take 300-600 mg by mouth 4 (four) times daily. 600 mg every morning, 300 mg at noon, 600 mg at bedtime, and 300 mg at 1 AM     hydrochlorothiazide 25 MG tablet  Commonly known as:  HYDRODIURIL  Take 25 mg by mouth daily before breakfast.     HYDROmorphone 4 MG tablet  Commonly known as:  DILAUDID  Take 1 tablet (4 mg total) by mouth every 8 (eight) hours as needed for moderate pain or severe pain. MAY FILL ON OR AFTER 07/16/13     losartan 50 MG tablet  Commonly  known as:  COZAAR  Take 50 mg by mouth daily.     OXYCONTIN 10 mg T12a 12 hr tablet  Generic drug:  OxyCODONE  Take 10 mg by mouth every 12 (twelve) hours.     pantoprazole 40 MG tablet  Commonly known as:  PROTONIX  Take 40 mg by mouth 2 (two) times daily.     polyethylene glycol packet  Commonly known as:  MIRALAX / GLYCOLAX  Take 17 g by mouth daily as needed for moderate constipation.     potassium chloride SA 20 MEQ tablet  Commonly known as:  K-DUR,KLOR-CON  Take 20 mEq by mouth daily.     predniSONE 5 MG tablet  Commonly known as:  DELTASONE  Take 5 mg by mouth every evening.     simvastatin 40 MG tablet  Commonly known as:  ZOCOR  Take 40 mg by mouth daily with lunch.     SYSTANE ULTRA 0.4-0.3 % Soln  Generic drug:  Polyethyl Glycol-Propyl Glycol  Place 2 drops into both eyes 2 (two) times daily as needed (dry eyes).     traZODone 50 MG tablet  Commonly known as:  DESYREL  Take 50 mg by mouth at bedtime as needed for sleep (take if the 100 mg dose is not effective.).       Allergies  Allergen Reactions  . Morphine And Related Shortness Of Breath, Nausea And Vomiting, Swelling and Other (See Comments)    Agitation, tolerates dilaudid  . Penicillins Hives, Shortness Of Breath and Rash    REACTION: hives, breathing problems  . Cefixime     rash  . Percocet [Oxycodone-Acetaminophen] Anxiety    Takes OxyContin at home.       Follow-up Information   Follow up with Illene Regulus, MD In 3 weeks.   Specialty:  Internal Medicine   Contact information:   [address] [place] [address] [phone]        The results of significant diagnostics from this hospitalization (including imaging, microbiology, ancillary and laboratory) are listed below for reference.    Significant Diagnostic Studies: Dg Chest 2 View  05/26/2013   CLINICAL DATA:  Shortness of breath and cough.  EXAM: CHEST  2 VIEW  COMPARISON:  DG CHEST 1V PORT dated 05/23/2013; DG CHEST  2 VIEW dated 05/08/2013; DG CHEST 2 VIEW dated 04/12/2013; CT ABD/PELV WO CM dated 08/11/2012  FINDINGS: Mediastinum unremarkable. Poor inspiration with basilar atelectasis. Prominent elevation of the right hemidiaphragm again noted. A catheter or coronary stent noted over the left heart. No pneumothorax. Bilateral shoulder replacements. Prior lumbar spine fusion.  IMPRESSION: Stable chest x-ray with poor inspiration and basilar atelectasis. Stable elevation right hemidiaphragm.   Electronically Signed   By: Maisie Fus  Register   On: 05/26/2013 17:10   Dg Chest 2 View  05/08/2013   CLINICAL DATA:  Cough, sudden onset congestion  EXAM: CHEST  2 VIEW  COMPARISON:  DG CHEST 1V PORT dated 05/06/2013; DG CHEST 1V PORT dated 03/22/2013  FINDINGS: There is elevation the right diaphragm. There is no focal parenchymal opacity, pleural effusion, or pneumothorax. The heart and mediastinal contours are unremarkable.  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 There are degenerative changes of the right acromioclavicular joint. There is as a chronic injury of the left acromioclavicular joint.  IMPRESSION: No active cardiopulmonary disease.   Electronically Signed   By: Elige Ko   On: 05/08/2013 13:16   Ct Head Wo Contrast  05/06/2013   CLINICAL DATA:  Altered mental status.  Patient fell.  EXAM: CT HEAD WITHOUT CONTRAST  TECHNIQUE: Contiguous axial images were obtained from the base of the skull through the vertex without intravenous contrast.  COMPARISON:  CT scan dated 07/14/2012  FINDINGS: No mass lesion. No midline shift. No acute hemorrhage or hematoma. No extra-axial fluid collections. No evidence of acute infarction. Brain parenchyma is normal except for mild cerebral cortical atrophy. No osseous abnormality.  IMPRESSION: No acute abnormality.  No change since the prior exam.   Electronically Signed   By: Geanie Cooley M.D.   On: 05/06/2013 14:21   Ct Cervical Spine Wo Contrast  05/06/2013   CLINICAL DATA:  Altered  mental status after a fall.  EXAM: CT CERVICAL  SPINE WITHOUT CONTRAST  TECHNIQUE: Multidetector CT imaging of the cervical spine was performed without intravenous contrast. Multiplanar CT image reconstructions were also generated.  COMPARISON:  None.  FINDINGS: There is no acute fracture.  No prevertebral soft tissue swelling.  The patient has severe degenerative disc disease at C2-3 with a 2 mm retrolisthesis of C2 on C3. There is a 3 mm anterolisthesis of C4 on C5 due to moderate left facet arthritis. This is not felt to be acute.  There is fairly severe degenerative disc disease at C5-6 and C6-7. There is a 2 mm anterolisthesis of C7 on T1 due to severe bilateral facet arthritis.  There is fairly severe right facet arthritis at C2-3 and C3-4.  The study is degraded by motion artifact but I feel that it is diagnostic.  IMPRESSION: No acute abnormality of the cervical spine. Multilevel degenerative disc and joint disease. Identified subluxations are felt to be chronic.   Electronically Signed   By: Geanie Cooley M.D.   On: 05/06/2013 14:26   Dg Chest Port 1 View  05/23/2013   CLINICAL DATA:  Nausea and shortness of breath.  EXAM: PORTABLE CHEST - 1 VIEW  COMPARISON:  PA and lateral chest 05/08/2013 in 07/14/2012.  FINDINGS: Marked elevation of the right hemidiaphragm relative to the left is unchanged. Lungs are clear. Heart size is normal. No pneumothorax or pleural effusion. Bilateral shoulder replacements noted.  IMPRESSION: No acute disease.  Stable compared to prior exam.   Electronically Signed   By: Drusilla Kanner M.D.   On: 05/23/2013 19:09   Dg Chest Port 1 View  05/06/2013   CLINICAL DATA:  Shortness of breath, week  EXAM: PORTABLE CHEST - 1 VIEW  COMPARISON:  04/12/2013  FINDINGS: Very limited inspiratory effect. Cannot evaluate heart size as a result. The vascular pattern appears normal. Right lung is clear. No abnormal opacities on the left. Stable scoliotic curvature thoracolumbar spine.   IMPRESSION: Study limited by very limited inspiratory effect. Grossly negative for acute abnormalities.   Electronically Signed   By: Esperanza Heir M.D.   On: 05/06/2013 13:43    Microbiology: Recent Results (from the past 240 hour(s))  CULTURE, BLOOD (SINGLE)     Status: None   Collection Time    05/18/13  4:01 PM      Result Value Ref Range Status   Organism ID, Bacteria NO GROWTH 5 DAYS   Final  CULTURE, BLOOD (SINGLE)     Status: None   Collection Time    05/18/13  4:01 PM      Result Value Ref Range Status   Organism ID, Bacteria NO GROWTH 5 DAYS   Final  CULTURE, BLOOD (ROUTINE X 2)     Status: None   Collection Time    05/23/13  8:21 PM      Result Value Ref Range Status   Specimen Description BLOOD LEFT ARM   Final   Special Requests BOTTLES DRAWN AEROBIC AND ANAEROBIC 10CC EA   Final   Culture  Setup Time     Final   Value: 05/24/2013 01:43     Performed at [facility]   Culture     Final   Value:        BLOOD CULTURE RECEIVED NO GROWTH TO DATE CULTURE WILL BE HELD FOR 5 DAYS BEFORE ISSUING A FINAL NEGATIVE REPORT     Performed at [facility]   Report Status PENDING   Incomplete  CULTURE, BLOOD (ROUTINE  X 2)     Status: None   Collection Time    05/23/13  8:26 PM      Result Value Ref Range Status   Specimen Description BLOOD LEFT HAND   Final   Special Requests BOTTLES DRAWN AEROBIC ONLY 10CC   Final   Culture  Setup Time     Final   Value: 05/24/2013 01:43     Performed at [facility]   Culture     Final   Value:        BLOOD CULTURE RECEIVED NO GROWTH TO DATE CULTURE WILL BE HELD FOR 5 DAYS BEFORE ISSUING A FINAL NEGATIVE REPORT     Performed at [facility]   Report Status PENDING   Incomplete  URINE CULTURE     Status: None   Collection Time    05/23/13  8:43 PM      Result Value Ref Range Status   Specimen Description URINE, RANDOM   Final   Special Requests NONE   Final   Culture  Setup Time     Final   Value:  05/23/2013 21:44     Performed at [facility] Count     Final   Value: 25,000 COLONIES/ML     Performed at [facility]   Culture     Final   Value: Multiple bacterial morphotypes present, none predominant. Suggest appropriate recollection if clinically indicated.     Performed at [facility]   Report Status 05/24/2013 FINAL   Final     Labs: Basic Metabolic Panel:  Recent Labs Lab 05/23/13 1435 05/24/13 0530 05/25/13 0749 05/26/13 0625 05/27/13 0406  NA 126* 124* 132* 130* 132*  K 3.6* 3.4* 3.4* 3.6* 4.2  CL 86* 89* 99 98 96  CO2 20 21 22 25 28   GLUCOSE 94 112* 103* 91 92  BUN 13 11 9 7 7   CREATININE 0.51 0.43* 0.47* 0.58 0.58  CALCIUM 8.8 7.9* 7.9* 7.8* 7.8*   Liver Function Tests:  Recent Labs Lab 05/23/13 1435  AST 28  ALT 13  ALKPHOS 85  BILITOT 1.0  PROT 7.2  ALBUMIN 3.2*    Recent Labs Lab 05/23/13 1435  LIPASE 67*   No results found for this basename: AMMONIA,  in the last 168 hours CBC:  Recent Labs Lab 05/23/13 1435 05/24/13 0530  WBC 11.2* 8.1  NEUTROABS 8.1*  --   HGB 13.3 11.8*  HCT 38.0* 34.2*  MCV 81.7 81.8  PLT 365 342   Cardiac Enzymes: No results found for this basename: CKTOTAL, CKMB, CKMBINDEX, TROPONINI,  in the last 168 hours BNP: BNP (last 3 results)  Recent Labs  05/06/13 1321  PROBNP 306.1*   CBG:  Recent Labs Lab 05/26/13 0737 05/26/13 1059 05/26/13 1738 05/26/13 2001 05/27/13 0740  GLUCAP 85 92 124* 112* 94       Signed:  Thorvald Orsino A  [facility] 05/27/2013, 10:12 AM

## 2013-05-28 ENCOUNTER — Inpatient Hospital Stay (HOSPITAL_COMMUNITY): Payer: Medicare Other

## 2013-05-28 ENCOUNTER — Inpatient Hospital Stay (HOSPITAL_COMMUNITY): Payer: Medicare Other | Admitting: Speech Pathology

## 2013-05-28 ENCOUNTER — Inpatient Hospital Stay (HOSPITAL_COMMUNITY): Payer: Medicare Other | Admitting: Occupational Therapy

## 2013-05-28 DIAGNOSIS — R5381 Other malaise: Secondary | ICD-10-CM

## 2013-05-28 MED ORDER — GABAPENTIN 300 MG PO CAPS
600.0000 mg | ORAL_CAPSULE | Freq: Two times a day (BID) | ORAL | Status: DC
  Administered 2013-05-28 – 2013-06-10 (×27): 600 mg via ORAL
  Filled 2013-05-28 (×30): qty 2

## 2013-05-28 NOTE — Consult Note (Signed)
Please see consult note from this week per WOC, continue wound care as ordered. Derya Dettmann Bandera RN,CWOCN [phone]

## 2013-05-28 NOTE — Progress Notes (Signed)
Patient ID: [name], male   DOB: 1946-01-14, 68 y.o.   MRN: [id number]         [facility]    Date of Admission:  05/27/2013     Objective: Temp:  [97.9 F (36.6 C)-98.3 F (36.8 C)] 98.3 F (36.8 C) (02/20 0515) Pulse Rate:  [63-64] 63 (02/20 0515) Resp:  [18] 18 (02/20 0515) BP: (147-155)/(88-91) 152/89 mmHg (02/20 0515) SpO2:  [93 %-94 %] 93 % (02/20 0515) Weight:  [59.9 kg (132 lb 0.9 oz)-62.7 kg (138 lb 3.7 oz)] 59.9 kg (132 lb 0.9 oz) (02/20 0515)  General: He is currently standing at the sink shaving Skin: No acute rash Lungs: Clear Cor: Regular S1 and S2 with no murmurs Abdomen:  nontender Lumbar incision healing slowly without evidence of infection  Lab Results Lab Results  Component Value Date   WBC 5.2 05/27/2013   HGB 11.6* 05/27/2013   HCT 34.3* 05/27/2013   MCV 84.1 05/27/2013   PLT 346 05/27/2013    Lab Results  Component Value Date   CREATININE 0.49* 05/27/2013   BUN 7 05/27/2013   NA 132* 05/27/2013   K 4.2 05/27/2013   CL 96 05/27/2013   CO2 28 05/27/2013    Lab Results  Component Value Date   ALT 13 05/23/2013   AST 28 05/23/2013   ALKPHOS 85 05/23/2013   BILITOT 1.0 05/23/2013      Lab Results  Component Value Date   CRP 0.9* 05/26/2013   Lab Results  Component Value Date   ESRSEDRATE 8 05/26/2013   Microbiology: Recent Results (from the past 240 hour(s))  CULTURE, BLOOD (SINGLE)     Status: None   Collection Time    05/18/13  4:01 PM      Result Value Ref Range Status   Organism ID, Bacteria NO GROWTH 5 DAYS   Final  CULTURE, BLOOD (SINGLE)     Status: None   Collection Time    05/18/13  4:01 PM      Result Value Ref Range Status   Organism ID, Bacteria NO GROWTH 5 DAYS   Final  CULTURE, BLOOD (ROUTINE X 2)     Status: None   Collection Time    05/23/13  8:21 PM      Result Value Ref Range Status   Specimen Description BLOOD LEFT ARM   Final   Special Requests BOTTLES DRAWN AEROBIC AND ANAEROBIC  10CC EA   Final   Culture  Setup Time     Final   Value: 05/24/2013 01:43     Performed at [facility]   Culture     Final   Value:        BLOOD CULTURE RECEIVED NO GROWTH TO DATE CULTURE WILL BE HELD FOR 5 DAYS BEFORE ISSUING A FINAL NEGATIVE REPORT     Performed at [facility]   Report Status PENDING   Incomplete  CULTURE, BLOOD (ROUTINE X 2)     Status: None   Collection Time    05/23/13  8:26 PM      Result Value Ref Range Status   Specimen Description BLOOD LEFT HAND   Final   Special Requests BOTTLES DRAWN AEROBIC ONLY 10CC   Final   Culture  Setup Time     Final   Value: 05/24/2013 01:43     Performed at [facility]   Culture     Final   Value:  BLOOD CULTURE RECEIVED NO GROWTH TO DATE CULTURE WILL BE HELD FOR 5 DAYS BEFORE ISSUING A FINAL NEGATIVE REPORT     Performed at [facility]   Report Status PENDING   Incomplete  URINE CULTURE     Status: None   Collection Time    05/23/13  8:43 PM      Result Value Ref Range Status   Specimen Description URINE, RANDOM   Final   Special Requests NONE   Final   Culture  Setup Time     Final   Value: 05/23/2013 21:44     Performed at [facility] Count     Final   Value: 25,000 COLONIES/ML     Performed at [facility]   Culture     Final   Value: Multiple bacterial morphotypes present, none predominant. Suggest appropriate recollection if clinically indicated.     Performed at [facility]   Report Status 05/24/2013 FINAL   Final    Studies/Results: Dg Chest 2 View  05/26/2013   CLINICAL DATA:  Shortness of breath and cough.  EXAM: CHEST  2 VIEW  COMPARISON:  DG CHEST 1V PORT dated 05/23/2013; DG CHEST 2 VIEW dated 05/08/2013; DG CHEST 2 VIEW dated 04/12/2013; CT ABD/PELV WO CM dated 08/11/2012  FINDINGS: Mediastinum unremarkable. Poor inspiration with basilar atelectasis. Prominent elevation of the right hemidiaphragm again noted. A catheter or coronary stent  noted over the left heart. No pneumothorax. Bilateral shoulder replacements. Prior lumbar spine fusion.  IMPRESSION: Stable chest x-ray with poor inspiration and basilar atelectasis. Stable elevation right hemidiaphragm.   Electronically Signed   By: Marcello Moores  Register   On: 05/26/2013 17:10    Assessment: His fever has resolved and I see no evidence of active infection.  Plan: 1. Observe off of antibiotics 2. Recommend restarting Humira  3. I will sign off now please call if I can be of any further assistance  Michel Bickers, MD [facility] [phone] pager   [phone] cell 05/28/2013, 11:56 AM

## 2013-05-28 NOTE — Progress Notes (Signed)
Patient information reviewed and entered into eRehab system by Emalee Knies, RN, CRRN, PPS Coordinator.  Information including medical coding and functional independence measure will be reviewed and updated through discharge.     Per nursing patient was given "Data Collection Information Summary for Patients in Inpatient Rehabilitation Facilities with attached "Privacy Act Statement-Health Care Records" upon admission.  

## 2013-05-28 NOTE — Evaluation (Signed)
Speech Language Pathology Assessment and Plan  Patient Details  Name: [name] MRN: [id number] Date of Birth: 1945-11-17  SLP Diagnosis: Voice disorder  Rehab Potential: Good ELOS:   10-15 days  Today's Date: 05/28/2013 Time: 1683-7290 Time Calculation (min): 60 min  Problem List:  Patient Active Problem List   Diagnosis Date Noted  . Lumbar post-laminectomy syndrome 05/27/2013  . Degenerative arthritis of spine 05/26/2013  . Hemochromatosis 05/26/2013  . Wound infection after surgery 05/26/2013  . Normocytic anemia 05/26/2013  . Unintentional weight loss 05/26/2013  . Protein-calorie malnutrition, severe 05/25/2013  . Hyponatremia 05/23/2013  . SIRS (systemic inflammatory response syndrome) 05/23/2013  . Dehydration 05/23/2013  . Fever 05/23/2013  . Chronic narcotic use 05/23/2013  . Opioid overdose 05/06/2013  . Acute respiratory failure with hypoxia 05/06/2013  . COPD (chronic obstructive pulmonary disease) 05/06/2013  . Acute renal failure 05/06/2013  . Acute encephalopathy 05/06/2013  . Frequent falls 05/06/2013  . Encounter for postoperative wound care 05/06/2013  . Atrial fibrillation 08/07/2010  . Rheumatoid arthritis(714.0) 10/18/2008  . DYSLIPIDEMIA 03/30/2007  . PSEUDOGOUT 03/30/2007  . HYPERTENSION 03/30/2007  . CAD 03/30/2007   Past Medical History:  Past Medical History  Diagnosis Date  . Hx of colonic polyps   . Diverticulosis   . Hypertension   . 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 CAD (coronary artery disease)     minimal coronary plaque in the LAD and right coronary system. 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 7-06. Last nuclear stress 11-17-06 fixed anterior/ inferior defect, no inducible ischemia, EF 81%  . Allergic rhinitis   . Hx of colonoscopy   . PONV (postoperative nausea and vomiting)   . Dysrhythmia 01-24-12     past hx. A.Fib x1 episode-responded to med.  . Depression   . Anxiety     pt. feels its related to the medicine that he takes   . GERD (gastroesophageal reflux disease)   . COPD (chronic obstructive pulmonary disease)     pt denies this hx on 05/24/2013  . Osteoarthritis   . RA (rheumatoid arthritis)   . Chronic back pain    Past Surgical History:  Past Surgical History  Procedure Laterality Date  . Appendectomy    . Total knee arthroplasty Right 2002     partial  . Shoulder hemi-arthroplasty Left 2008    partial  . Foot surgery Right 11-08    for removal of bone spurs-  . Ankle reconstruction Right 6-09    [facility]  . Posterior lumbar fusion  12-10    L4-5 diskectomy w/ fusion, cage placement and rods; [facility]  . Partial knee arthroplasty Left   . Total shoulder replacement Right   . Joint replacement    . Cataract extraction, bilateral Bilateral 01-24-12  . Orif shoulder fracture  02/06/2012    Procedure: OPEN REDUCTION INTERNAL FIXATION (ORIF) SHOULDER FRACTURE;  Surgeon: Nita Sells, MD;  Location: [facility];  Service: Orthopedics;  Laterality: Left;  ORIF of a Left Shoulder Fracture with  Iliac Crest Bone Graft aspiration   . Harvest bone graft  02/06/2012    Procedure: HARVEST ILIAC BONE GRAFT;  Surgeon: Nita Sells, MD;  Location: [facility];  Service: Orthopedics;;  bone marrow aspirqation   . Hardware removal  03/09/2012    Procedure: HARDWARE REMOVAL;  Surgeon: Nita Sells, MD;  Location:  [place];  Service: Orthopedics;  Laterality: Left;  Hardware Removal from Left Shoulder  . Shoulder arthroscopy Right 2013  . Carpal tunnel release Right 1990's  . Total ankle replacement Right 2008    at [facility]  . Hammer toe surgery Right 07/2012    "broke 4 hammertoes"   . Posterior lumbar fusion 4 level N/A 03/02/2013    Procedure: Lumbar One to Sacral One Posterior lumbar interbody fusion;  Surgeon: Floyce Stakes, MD;  Location: [place]  [facility];  Service: Neurosurgery;  Laterality: N/A;  L1 to S1 Posterior lumbar interbody fusion  . Lumbar wound debridement N/A 03/17/2013    Procedure: Incision and drainage of superficial lumbar wound;  Surgeon: Floyce Stakes, MD;  Location: [place] [facility];  Service: Neurosurgery;  Laterality: N/A;  Incision and drainage of superficial lumbar wound  . Tonsillectomy    . Hernia repair Bilateral   . Coronary angioplasty with stent placement  2006    "while repairing 1st stent, a second area tore and they had to place 2nd stent " ?LAD & CX    Assessment / Plan / Recommendation Clinical Impression Patient presents with a moderate voice disorder, characterized by low vocal intensity, hoarse quality of voice, and poor coordination of respiration and phonation. 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 Patient was referred to and evaluated by an Otolaryngologist on 04/15/13, and at this time, no vocal fold pathologies were found, and MD indicated that it was a "normal scope/laryngoscopic exam". Suspect that patient's current voice disorder is caused by a combination of poor respiratory support and coordination, and weakness of vocal fold musculature, resulting in inadequate vocal fold adduction and decreased vocal intensity. SLP evaluated patient following his visit with the otolaryngologist, and at this time, therapist recommended patient be seen for voice therapy at an outpatient setting. Unfortunately, patient has been in and out of the hospital since that time due to complications and slow healing from his lumbar surgery. Patient will benefit from skilled speech-language pathologist intervention during his Rehab stay at the hospital with recommendation for a more comprehensive evaluation and treatment by an SLP who specializes in voice disorders.   Skilled Therapeutic Interventions            SLP Assessment  Patient  will need skilled [place] [facility] during CIR admission    Recommendations  Follow up Recommendations: Outpatient SLP (outpatient SLP-voice ) Equipment Recommended: None recommended by SLP    SLP Frequency 5 out of 7 days   SLP Treatment/Interventions Cueing hierarchy;Therapeutic Activities;Therapeutic Exercise;Patient/family education;Functional tasks;Neuromuscular electrical stimulation;Speech/Language facilitation (will determine benefit from NMES for voice therapy)    Pain   Prior Functioning Cognitive/Linguistic Baseline: Within functional limits Type of Home: House  Lives With: Son;Daughter Available Help at Discharge: Family Vocation: Self employed (business owner/sales)  Short Term Goals: Week 1: SLP Short Term Goal 1 (Week 1): Patient will perform diaphragmatic breathing and respiratory support and control exercises with minimal cues. SLP Short Term Goal 2 (Week 1): Patient will perform vocal adduction and sustained phonation exercises with minimal cues. SLP Short Term Goal 3 (Week 1): SLP will determine benefit from NMES for use in voice treatment.  See FIM for current functional status Refer to Care Plan for Long Term Goals  Recommendations for other services: None  Discharge Criteria: Patient will be discharged from SLP if patient refuses treatment 3 consecutive  times without medical reason, if treatment goals not met, if there is a change in medical status, if patient makes no progress towards goals or if patient is discharged from hospital.  The above assessment, treatment plan, treatment alternatives and goals were discussed and mutually agreed upon: by patient  Dannial Monarch 05/28/2013, 5:22 PM  Sonia Baller, MA, CCC-SLP [facility] Speech-Language Pathologist

## 2013-05-28 NOTE — Progress Notes (Signed)
Physical Therapy Assessment and Plan  Patient Details  Name: [name] MRN: [id number] Date of Birth: 04/29/1945  PT Diagnosis: Abnormality of gait, Difficulty walking, Muscle weakness and Pain in right shoulder Rehab Potential: Good ELOS: 7-10 days   Today's Date: 05/28/2013 Time: 1300-1405 Time Calculation (min): 65 min  Problem List:  Patient Active Problem List   Diagnosis Date Noted  . Lumbar post-laminectomy syndrome 05/27/2013  . Degenerative arthritis of spine 05/26/2013  . Hemochromatosis 05/26/2013  . Wound infection after surgery 05/26/2013  . Normocytic anemia 05/26/2013  . Unintentional weight loss 05/26/2013  . Protein-calorie malnutrition, severe 05/25/2013  . Hyponatremia 05/23/2013  . SIRS (systemic inflammatory response syndrome) 05/23/2013  . Dehydration 05/23/2013  . Fever 05/23/2013  . Chronic narcotic use 05/23/2013  . Opioid overdose 05/06/2013  . Acute respiratory failure with hypoxia 05/06/2013  . COPD (chronic obstructive pulmonary disease) 05/06/2013  . Acute renal failure 05/06/2013  . Acute encephalopathy 05/06/2013  . Frequent falls 05/06/2013  . Encounter for postoperative wound care 05/06/2013  . Atrial fibrillation 08/07/2010  . Rheumatoid arthritis(714.0) 10/18/2008  . DYSLIPIDEMIA 03/30/2007  . PSEUDOGOUT 03/30/2007  . HYPERTENSION 03/30/2007  . CAD 03/30/2007    Past Medical History:  Past Medical History  Diagnosis Date  . Hx of colonic polyps   . Diverticulosis   . Hypertension   . 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 CAD (coronary artery disease)     minimal coronary plaque in the LAD and right coronary system. 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 7-06. Last nuclear stress 11-17-06 fixed anterior/ inferior defect, no inducible ischemia, EF 81%  . Allergic rhinitis   . Hx of colonoscopy   . PONV  (postoperative nausea and vomiting)   . Dysrhythmia 01-24-12    past hx. A.Fib x1 episode-responded to med.  . Depression   . Anxiety     pt. feels its related to the medicine that he takes   . GERD (gastroesophageal reflux disease)   . COPD (chronic obstructive pulmonary disease)     pt denies this hx on 05/24/2013  . Osteoarthritis   . RA (rheumatoid arthritis)   . Chronic back pain    Past Surgical History:  Past Surgical History  Procedure Laterality Date  . Appendectomy    . Total knee arthroplasty Right 2002     partial  . Shoulder hemi-arthroplasty Left 2008    partial  . Foot surgery Right 11-08    for removal of bone spurs-  . Ankle reconstruction Right 6-09    [facility]  . Posterior lumbar fusion  12-10    L4-5 diskectomy w/ fusion, cage placement and rods; [facility]  . Partial knee arthroplasty Left   . Total shoulder replacement Right   . Joint replacement    . Cataract extraction, bilateral Bilateral 01-24-12  . Orif shoulder fracture  02/06/2012    Procedure: OPEN REDUCTION INTERNAL FIXATION (ORIF) SHOULDER FRACTURE;  Surgeon: Mable Paris, MD;  Location: [facility];  Service: Orthopedics;  Laterality: Left;  ORIF of a Left Shoulder Fracture with  Iliac Crest Bone Graft aspiration   . Harvest bone graft  02/06/2012    Procedure: HARVEST ILIAC BONE GRAFT;  Surgeon: Mable Paris, MD;  Location: [facility];  Service: Orthopedics;;  bone marrow aspirqation   . Hardware removal  03/09/2012    Procedure: HARDWARE REMOVAL;  Surgeon: Mable Paris, MD;  Location: [facility];  Service: Orthopedics;  Laterality: Left;  Hardware Removal from Left Shoulder  . Shoulder arthroscopy Right 2013  . Carpal tunnel release Right 1990's  . Total ankle replacement Right 2008    at [facility]  . Hammer toe surgery Right 07/2012    "broke 4 hammertoes"   . Posterior lumbar fusion 4 level N/A 03/02/2013    Procedure: Lumbar One to Sacral One Posterior lumbar  interbody fusion;  Surgeon: Karn Cassis, MD;  Location: [facility];  Service: Neurosurgery;  Laterality: N/A;  L1 to S1 Posterior lumbar interbody fusion  . Lumbar wound debridement N/A 03/17/2013    Procedure: Incision and drainage of superficial lumbar wound;  Surgeon: Karn Cassis, MD;  Location: [facility];  Service: Neurosurgery;  Laterality: N/A;  Incision and drainage of superficial lumbar wound  . Tonsillectomy    . Hernia repair Bilateral   . Coronary angioplasty with stent placement  2006    "while repairing 1st stent, a second area tore and they had to place 2nd stent " ?LAD & CX    Assessment & Plan Clinical Impression: Patient is a 68 y.o. right-handed male with history of rheumatoid arthritis with chronic prednisone, lumbar L4-5 fusion 2010. Admitted 03/02/2013 underwent L1-S1 decompression and fusion with course complicated by hypotension requiring pressor support. He was discharged to skilled nursing facility 03/10/2013 and returned to [facility] 03/16/2013 with fever 102 and wound drainage. On 03/17/2013 he returned to the operating room for debridement of his wound. A wound swab grew P aeruginosa (pan sens). He was discharged home with assistance from his wife. Patient presented 05/23/2013 with nausea vomiting poor appetite low-grade fever. He had been using a wheelchair rolling walker for short distances while at home. Recent CT of the head showed no acute abnormalities. Chest x-ray negative. Urine culture 25,000 multi- species. Mild hyponatremia 126. Placed on broad-spectrum antibiotics with Primaxin as well as vancomycin and followup infectious disease with all antibiotics since discontinued. Lumbar wound continues to heal nicely with followup per wound care nurse. Subcutaneous Lovenox added for DVT prophylaxis. Physical and occupational therapy evaluation 05/24/2013 and OT evaluation 05/25/2013 noted deconditioning and recommendations of physical medicine rehabilitation  consult. Patient transferred to CIR on 05/27/2013 .   Patient currently requires min with mobility secondary to muscle weakness, decreased cardiorespiratoy endurance and decreased sitting balance, decreased standing balance, decreased postural control and decreased balance strategies.  Prior to hospitalization, patient was independent  with mobility and lived with wife in a home with patient reporting multiple falls at home.  Home access is  Level entry.  Patient will benefit from skilled PT intervention to maximize safe functional mobility, improve static and dynamic balance, improve endurance, minimize fall risk, and decrease caregiver burden for planned discharge home with intermittent assist.  Anticipate patient will benefit from follow up [facility] at discharge.  PT - End of Session Activity Tolerance: Tolerates 30+ min activity with multiple rests Endurance Deficit: Yes PT Assessment Rehab Potential: Good PT Patient demonstrates impairments in the following area(s): Balance;Endurance;Motor;Pain;Safety;Skin Integrity PT Transfers Functional Problem(s): Bed Mobility;Bed to Chair;Car;Furniture PT Locomotion Functional Problem(s): Ambulation;Stairs PT Plan PT Intensity: Minimum of 1-2 x/day ,45 to 90 minutes PT Frequency: 5 out of 7 days PT Duration Estimated Length of Stay: 7-10 days PT Treatment/Interventions: Ambulation/gait training;Balance/vestibular training;Community reintegration;Discharge planning;Disease management/prevention;DME/adaptive equipment instruction;Functional mobility training;Neuromuscular re-education;Pain management;Patient/family education;Skin care/wound management;Stair training;Therapeutic Activities;Therapeutic Exercise;UE/LE Strength taining/ROM;UE/LE Coordination activities;Wheelchair propulsion/positioning PT Transfers  Anticipated Outcome(s): Supervision with basic transfers PT Locomotion Anticipated Outcome(s): Supervision ambulatory level; Supervision stairs PT  Recommendation Follow Up Recommendations: Home health PT Patient destination: Home Equipment Recommended: None recommended by PT Equipment Details: patient already owns rolling walker and w/c  Skilled Therapeutic Intervention Session consisted of pt performing basic stand pivot transfers with Min A and the use of a RW (cueing needed for proper hand placement when performing transfer), bed mobility with S (pt showing good understanding of log rolling technique), gait from the pt's room to the rehab gym with Min A and the use of a RW (pt needing verbal cueing in order to perform appropriate heel strike during gait and to stay centered in the walker), and 2 steps x 2 with Mod A (lifting assistance needed going up the stairs) and the use of bilateral handrails. Pt ended the session with his balance being assessed by performing the berg balance scale test with pt's score being 27.  PT Evaluation Precautions/Restrictions Precautions Precautions: Back Precaution Comments: pt verbalized understanding of 3/3 back precautions Required Braces or Orthoses:  (patient wears spinal brace but no brace ordered) Spinal Brace: Lumbar corset;Applied in sitting position Restrictions Weight Bearing Restrictions: No Pain Pt c/o 6/10 shoulder pain. Pt received an ice pack prior to the session and rest breaks were taken throughout the session in order to alleviate this pain.    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)  Cognition Overall Cognitive Status: Within Functional Limits for tasks assessed Arousal/Alertness:  Awake/alert Orientation Level: Oriented X4 Safety/Judgment: Appears intact Sensation Sensation Light Touch: Appears Intact Motor  Motor Motor: Within Functional Limits   Trunk/Postural Assessment  Cervical Assessment Cervical Assessment: Within Functional Limits Thoracic Assessment Thoracic Assessment: Within Functional Limits Lumbar Assessment Lumbar Assessment: Within Functional Limits Postural Control Postural Control: Deficits on evaluation (deficits in standing)  Balance Standardized Balance Assessment Standardized Balance Assessment: Berg Balance Test Berg Balance Test Sit to Stand: Able to stand  independently using hands Standing Unsupported: Able to stand 2 minutes with supervision Sitting with Back Unsupported but Feet Supported on Floor or Stool: Able to sit safely and securely 2 minutes Stand to Sit: Controls descent by using hands Transfers: Able to transfer with verbal cueing and /or supervision Standing Unsupported with Eyes Closed: Able to stand 10 seconds with supervision Standing Ubsupported with Feet Together: Able to place feet together independently and stand for 1 minute with supervision From Standing, Reach Forward with Outstretched Arm: Reaches forward but needs supervision (modified due to back precautions) From Standing Position, Pick up Object from Floor: Unable to pick up and needs supervision (modified due to back precautions) From Standing Position, Turn to Look Behind Over each Shoulder: Needs supervision when turning (modified due to back precautions) Turn 360 Degrees: Needs close supervision or verbal cueing Standing Unsupported, Alternately Place Feet on Step/Stool: Able to complete >2 steps/needs minimal assist Standing Unsupported, One Foot in Front: Loses balance while stepping or standing Standing on One Leg: Tries to lift leg/unable to hold 3 seconds but remains standing independently Total Score: 27 Dynamic Sitting Balance Dynamic Sitting  - Level of Assistance: 5: Stand by assistance Static Standing Balance  Static Standing - Level of Assistance: 5: Stand by assistance Dynamic Standing Balance Dynamic Standing - Level of Assistance: 4: Min assist Extremity Assessment      RLE Assessment RLE Assessment: Exceptions to [facility] RLE Strength RLE Overall Strength Comments: MMT grades: Hip Flexion 4-/5, Knee Extension 4/5, Ankle Plantarflexion 4+/5, and Ankle Dorsiflexion 4/5 LLE Assessment LLE Assessment: Exceptions to [facility] LLE Strength LLE Overall Strength Comments: MMT grades: Hip Flexion 4-/5, Knee Extension 4/5, Ankle Plantarflexion 4+/5, and Ankle Dorsiflexion 4/5  FIM:  FIM - Banker Devices: Walker;Arm rests Bed/Chair Transfer: 5: Sit > Supine: Supervision (verbal cues/safety issues);5: Supine > Sit: Supervision (verbal cues/safety issues);4: Bed > Chair or W/C: Min A (steadying Pt. > 75%);4: Chair or W/C > Bed: Min A (steadying Pt. > 75%) FIM - Locomotion: Wheelchair Locomotion: Wheelchair: 1: Travels less than 50 ft with minimal assistance (Pt.>75%) FIM - Locomotion: Ambulation Locomotion: Ambulation Assistive Devices: Walker - Rolling Locomotion: Ambulation: 2: Travels 50 - 149 ft with minimal assistance (Pt.>75%) FIM - Locomotion: Stairs Locomotion: Building control surveyor: Hand rail - 2 Locomotion: Stairs: 2: Up and Down 4 - 11 stairs with moderate assistance (Pt: 50 - 74%)   Refer to Care Plan for Long Term Goals  Recommendations for other services: None  Discharge Criteria: Patient will be discharged from PT if patient refuses treatment 3 consecutive times without medical reason, if treatment goals not met, if there is a change in medical status, if patient makes no progress towards goals or if patient is discharged from hospital.  The above assessment, treatment plan, treatment alternatives and goals were discussed and mutually agreed upon: by  patient  [name] 05/28/2013, 3:53 PM

## 2013-05-28 NOTE — Progress Notes (Signed)
Subjective/Complaints: No problems over night. Back pain tolerable. Able to sleep.  A 12 point review of systems has been performed and if not noted above is otherwise negative.   Objective: Vital Signs: Blood pressure 152/89, pulse 63, temperature 98.3 F (36.8 C), temperature source Oral, resp. rate 18, weight 59.9 kg (132 lb 0.9 oz), SpO2 93.00%. Dg Chest 2 View  05/26/2013   CLINICAL DATA:  Shortness of breath and cough.  EXAM: CHEST  2 VIEW  COMPARISON:  DG CHEST 1V PORT dated 05/23/2013; DG CHEST 2 VIEW dated 05/08/2013; DG CHEST 2 VIEW dated 04/12/2013; CT ABD/PELV WO CM dated 08/11/2012  FINDINGS: Mediastinum unremarkable. Poor inspiration with basilar atelectasis. Prominent elevation of the right hemidiaphragm again noted. A catheter or coronary stent noted over the left heart. No pneumothorax. Bilateral shoulder replacements. Prior lumbar spine fusion.  IMPRESSION: Stable chest x-ray with poor inspiration and basilar atelectasis. Stable elevation right hemidiaphragm.   Electronically Signed   By: Marcello Moores  Register   On: 05/26/2013 17:10    Recent Labs  05/27/13 1837  WBC 5.2  HGB 11.6*  HCT 34.3*  PLT 346    Recent Labs  05/26/13 0625 05/27/13 0406 05/27/13 1837  NA 130* 132*  --   K 3.6* 4.2  --   CL 98 96  --   GLUCOSE 91 92  --   BUN 7 7  --   CREATININE 0.58 0.58 0.49*  CALCIUM 7.8* 7.8*  --    CBG (last 3)   Recent Labs  05/27/13 1139 05/27/13 1651 05/27/13 2127  GLUCAP 113* 86 124*    Wt Readings from Last 3 Encounters:  05/28/13 59.9 kg (132 lb 0.9 oz)  05/27/13 61.75 kg (136 lb 2.1 oz)  05/18/13 58.968 kg (130 lb)    Physical Exam:  HENT: oral mucosa pink and moist Head: Normocephalic.  Eyes: EOM are normal.  Neck: Normal range of motion. Neck supple. No thyromegaly present.  Cardiovascular: Normal rate and regular rhythm.  Respiratory: Effort normal and breath sounds normal. No respiratory distress.  GI: Soft. Bowel sounds are normal.  He exhibits no distension.    Patient was able to state his name and age. He was a poor medical historian , place is [place]  Skin:  Back incision is clean and dry  granulation tissue noted, lumbar incision approximately 1 cm deep without drainage  Neuro:  Motor strength remains 4/5 bilateral deltoid, bicep, tricep, grip, hip flexor, knee extensors, ankle dorsiflexor plantar flexion  Sensory intact to light touch in bilateral upper and lower limb Psych: pleasant and appropriate  Assessment/Plan: 1. Functional deficits secondary to Lumbar stenosis s/p L1-S1 fusion which require 3+ hours per day of interdisciplinary therapy in a comprehensive inpatient rehab setting. Physiatrist is providing close team supervision and 24 hour management of active medical problems listed below. Physiatrist and rehab team continue to assess barriers to discharge/monitor patient progress toward functional and medical goals. FIM:                                 Medical Problem List and Plan:  1. 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 Patient with history of multiple back surgeries  2. DVT Prophylaxis/Anticoagulation: Subcutaneous Lovenox. Monitor platelet counts and any signs of bleeding  3. Pain Management: Neurontin 600 mg twice a day, OxyContin sustained-release 10 mg every 12 hours, Dilaudid 4  mg every 8 hours moderate to severe pain   -controlled at present 4. Mood/depression. Prozac 40 mg twice a day, Xanax 0.5 mg 3 times a day as needed. Provide emotional support and  5. Neuropsych: This patient is not capable of making decisions on his own behalf.  6. Wound. Followup wound care nurse with dressings to back wound as directed  7. Hoarseness of voice after recent back surgery. Followup ENT Dr. Thera Flake.   normal scope/laryngoscopic exam. Continued followup speech therapy. Voice quality improving 8. Rheumatoid arthritis. Chronic prednisone  therapy.? resume Humira 40 mg subcutaneous every 14 days as prior to admission  9. Hypertension. Cozaar 50 mg daily, Lopressor 25 mg twice a day. Monitor with increased activity  10. Hyperlipidemia. Zocor.  11. COPD. Spiriva daily, albuterol nebulizer every 2 hours as needed. Check oxygen saturations every shift      LOS (Days) 1 A FACE TO FACE EVALUATION WAS PERFORMED  SWARTZ,ZACHARY T 05/28/2013 8:18 AM

## 2013-05-28 NOTE — Progress Notes (Signed)
Reviewed and in agreement with evaluation and treatment provided.

## 2013-05-28 NOTE — Evaluation (Signed)
Occupational Therapy Assessment and Plan & Session Notes  Patient Details  Name: [name] MRN: [id number] Date of Birth: July 25, 1945  OT Diagnosis: acute pain and muscle weakness (generalized) Rehab Potential: Rehab Potential: Good ELOS: 7-10 days   Today's Date: 05/28/2013  Problem List:  Patient Active Problem List   Diagnosis Date Noted  . Lumbar post-laminectomy syndrome 05/27/2013  . Degenerative arthritis of spine 05/26/2013  . Hemochromatosis 05/26/2013  . Wound infection after surgery 05/26/2013  . Normocytic anemia 05/26/2013  . Unintentional weight loss 05/26/2013  . Protein-calorie malnutrition, severe 05/25/2013  . Hyponatremia 05/23/2013  . SIRS (systemic inflammatory response syndrome) 05/23/2013  . Dehydration 05/23/2013  . Fever 05/23/2013  . Chronic narcotic use 05/23/2013  . Opioid overdose 05/06/2013  . Acute respiratory failure with hypoxia 05/06/2013  . COPD (chronic obstructive pulmonary disease) 05/06/2013  . Acute renal failure 05/06/2013  . Acute encephalopathy 05/06/2013  . Frequent falls 05/06/2013  . Encounter for postoperative wound care 05/06/2013  . Atrial fibrillation 08/07/2010  . Rheumatoid arthritis(714.0) 10/18/2008  . DYSLIPIDEMIA 03/30/2007  . PSEUDOGOUT 03/30/2007  . HYPERTENSION 03/30/2007  . CAD 03/30/2007    Past Medical History:  Past Medical History  Diagnosis Date  . Hx of colonic polyps   . Diverticulosis   . Hypertension   . 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 CAD (coronary artery disease)     minimal coronary plaque in the LAD and right coronary system. 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 7-06. Last nuclear stress 11-17-06 fixed anterior/ inferior defect, no inducible ischemia, EF 81%  . Allergic rhinitis   . Hx of colonoscopy   . PONV (postoperative nausea and vomiting)   . Dysrhythmia  01-24-12    past hx. A.Fib x1 episode-responded to med.  . Depression   . Anxiety     pt. feels its related to the medicine that he takes   . GERD (gastroesophageal reflux disease)   . COPD (chronic obstructive pulmonary disease)     pt denies this hx on 05/24/2013  . Osteoarthritis   . RA (rheumatoid arthritis)   . Chronic back pain    Past Surgical History:  Past Surgical History  Procedure Laterality Date  . Appendectomy    . Total knee arthroplasty Right 2002     partial  . Shoulder hemi-arthroplasty Left 2008    partial  . Foot surgery Right 11-08    for removal of bone spurs-  . Ankle reconstruction Right 6-09    [facility]  . Posterior lumbar fusion  12-10    L4-5 diskectomy w/ fusion, cage placement and rods; [facility]  . Partial knee arthroplasty Left   . Total shoulder replacement Right   . Joint replacement    . Cataract extraction, bilateral Bilateral 01-24-12  . Orif shoulder fracture  02/06/2012    Procedure: OPEN REDUCTION INTERNAL FIXATION (ORIF) SHOULDER FRACTURE;  Surgeon: Mable Paris, MD;  Location: [facility];  Service: Orthopedics;  Laterality: Left;  ORIF of a Left Shoulder Fracture with  Iliac Crest Bone Graft aspiration   . Harvest bone graft  02/06/2012    Procedure: HARVEST ILIAC BONE GRAFT;  Surgeon: Mable Paris, MD;  Location: [facility];  Service: Orthopedics;;  bone marrow aspirqation   . Hardware removal  03/09/2012    Procedure: HARDWARE REMOVAL;  Surgeon: Mable Paris, MD;  Location:  [facility];  Service: Orthopedics;  Laterality: Left;  Hardware Removal from Left Shoulder  . Shoulder arthroscopy Right 2013  . Carpal tunnel release Right 1990's  . Total ankle replacement Right 2008    at [facility]  . Hammer toe surgery Right 07/2012    "broke 4 hammertoes"   . Posterior lumbar fusion 4 level N/A 03/02/2013    Procedure: Lumbar One to Sacral One Posterior lumbar interbody fusion;  Surgeon: Karn Cassis, MD;   Location: [facility];  Service: Neurosurgery;  Laterality: N/A;  L1 to S1 Posterior lumbar interbody fusion  . Lumbar wound debridement N/A 03/17/2013    Procedure: Incision and drainage of superficial lumbar wound;  Surgeon: Karn Cassis, MD;  Location: [facility];  Service: Neurosurgery;  Laterality: N/A;  Incision and drainage of superficial lumbar wound  . Tonsillectomy    . Hernia repair Bilateral   . Coronary angioplasty with stent placement  2006    "while repairing 1st stent, a second area tore and they had to place 2nd stent " ?LAD & CX     Clinical Impression: [name] is a 68 y.o. right-handed male with history of rheumatoid arthritis with chronic prednisone, lumbar L4-5 fusion 2010. Admitted 03/02/2013 underwent L1-S1 decompression and fusion with course complicated by hypotension requiring pressor support. He was discharged to skilled nursing facility 03/10/2013 and returned to [facility] 03/16/2013 with fever 102 and wound drainage. On 03/17/2013 he returned to the operating room for debridement of his wound. A wound swab grew P aeruginosa (pan sens). He was discharged home with assistance from his wife. Patient presented 05/23/2013 with nausea vomiting poor appetite low-grade fever. He had been using a wheelchair rolling walker for short distances while at home. Recent CT of the head showed no acute abnormalities. Chest x-ray negative. Urine culture 25,000 multi- species. Mild hyponatremia 126. Placed on broad-spectrum antibiotics with Primaxin as well as vancomycin and followup infectious disease with all antibiotics since discontinued. Lumbar wound continues to heal nicely with followup per wound care nurse. Subcutaneous Lovenox added for DVT prophylaxis. Physical and occupational therapy evaluation 05/24/2013 and OT evaluation 05/25/2013 noted deconditioning and recommendations of physical medicine rehabilitation consult. Patient was admitted for comprehensive  rehabilitation program. Patient transferred to CIR on 05/27/2013 .    Patient currently requires min>max assist with basic self-care skills and IADL secondary to muscle weakness and muscle joint tightness and decreased sitting balance, decreased standing balance, decreased postural control, decreased balance strategies and difficulty maintaining precautions.  Prior to initial hospitalization, patient could complete ADLs and IADLs independently.   Patient will benefit from skilled intervention to increase independence with basic self-care skills prior to discharge home with son and daughter-in-law.  Anticipate patient will require 24 hour supervision and follow up home health.  OT - End of Session Activity Tolerance: Tolerates 10 - 20 min activity with multiple rests Endurance Deficit: Yes OT Assessment Rehab Potential: Good Barriers to Discharge:  (none known at this time) OT Patient demonstrates impairments in the following area(s): Balance;Endurance;Pain;Perception;Safety;Skin Integrity OT Basic ADL's Functional Problem(s): Grooming;Bathing;Dressing;Toileting OT Advanced ADL's Functional Problem(s):  (n/a at this time) OT Transfers Functional Problem(s): Toilet;Tub/Shower OT Additional Impairment(s): None (pain management > right shoulder) OT Plan OT Intensity: Minimum of 1-2 x/day, 45 to 90 minutes OT Frequency: 5 out of 7 days OT Duration/Estimated Length of Stay: 7-10 days OT Treatment/Interventions: Balance/vestibular training;Community reintegration;Discharge planning;DME/adaptive equipment instruction;Functional mobility training;Pain management;Patient/family education;Psychosocial support;Self Care/advanced ADL retraining;Skin care/wound managment;Therapeutic  Activities;Therapeutic Exercise;UE/LE Strength taining/ROM;UE/LE Coordination activities;Wheelchair propulsion/positioning OT Self Feeding Anticipated Outcome(s): Independent OT Basic Self-Care Anticipated Outcome(s):  supervision OT Toileting Anticipated Outcome(s): supervision OT Bathroom Transfers Anticipated Outcome(s): supervision OT Recommendation Patient destination: Home Follow Up Recommendations: Home health OT Equipment Recommended: Tub/shower seat  Precautions/Restrictions  Precautions Precautions: Back Type of Shoulder Precautions: Pt able to state 3/3 precautions with min verbal cueing Required Braces or Orthoses:  (patient states he wears his brace from a previous surgery, no brace ordered) Spinal Brace: Lumbar corset;Applied in sitting position (brace donned for comfort) Restrictions Weight Bearing Restrictions: No  General Chart Reviewed: Yes Family/Caregiver Present: No  Pain Pain Assessment Pain Assessment: 0-10 Pain Score: 4  Pain Type: Chronic pain Pain Location: Shoulder Pain Orientation: Right;Left Pain Descriptors / Indicators: Aching (patient states arthric pain) Pain Onset: On-going Pain Intervention(s): RN made aware  Home Living/Prior Functioning Home Living Family/patient expects to be discharged to:: Private residence Living Arrangements: Spouse/significant other Available Help at Discharge: Family (patient plans on living with son & daughter-in-law for a while at discharge) Type of Home: Apartment Home Access: Level entry Home Layout: One level Additional Comments: This home living information is based on son and daughter-in-laws house/apartment  Lives With: Significant other IADL History Homemaking Responsibilities: Yes Meal Prep Responsibility: Secondary Laundry Responsibility: Secondary Cleaning Responsibility: Secondary Bill Paying/Finance Responsibility: Secondary Shopping Responsibility: Secondary Child Care Responsibility: No Current License: Yes Mode of Transportation: Car Occupation: Full time employment 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 Leisure and Hobbies: Loves skiing, golfing Prior Function Level of Independence:  Independent with basic ADLs;Independent with transfers;Independent with gait  Able to Take Stairs?: Yes  ADL - See FIM  Vision/Perception  Vision - History Baseline Vision: No visual deficits Patient Visual Report: No change from baseline Perception Perception: Within Functional Limits Praxis Praxis: Intact   Cognition Overall Cognitive Status: Within Functional Limits for tasks assessed Arousal/Alertness: Awake/alert Orientation Level: Oriented X4 Memory: Appears intact Awareness: Appears intact Problem Solving: Appears intact Safety/Judgment: Appears intact  Sensation Sensation Additional Comments: BUEs appear intact Coordination Gross Motor Movements are Fluid and Coordinated: Yes Fine Motor Movements are Fluid and Coordinated: Yes  Motor  Motor Motor: Within Functional Limits  Trunk/Postural Assessment  Cervical Assessment Cervical Assessment: Within Functional Limits Thoracic Assessment Thoracic Assessment: Within Functional Limits Lumbar Assessment Lumbar Assessment: Within Functional Limits Postural Control Postural Control: Deficits on evaluation (deficits in standing)   Balance Standardized Balance Assessment Standardized Balance Assessment: Berg Balance Test Berg Balance Test Sit to Stand: Able to stand  independently using hands Standing Unsupported: Able to stand 2 minutes with supervision Sitting with Back Unsupported but Feet Supported on Floor or Stool: Able to sit safely and securely 2 minutes Stand to Sit: Controls descent by using hands Transfers: Able to transfer with verbal cueing and /or supervision Standing Unsupported with Eyes Closed: Able to stand 10 seconds with supervision Standing Ubsupported with Feet Together: Able to place feet together independently and stand for 1 minute with supervision From Standing, Reach Forward with Outstretched Arm: Reaches forward but needs supervision (modified due to back precautions) From Standing  Position, Pick up Object from Floor: Unable to pick up and needs supervision (modified due to back precautions) From Standing Position, Turn to Look Behind Over each Shoulder: Needs supervision when turning (modified due to back precautions) Turn 360 Degrees: Needs close supervision or verbal cueing Standing Unsupported, Alternately Place Feet on Step/Stool: Able to complete >2 steps/needs minimal assist Standing Unsupported, One Foot in Front:  Loses balance while stepping or standing Standing on One Leg: Tries to lift leg/unable to hold 3 seconds but remains standing independently Total Score: 27 Dynamic Sitting Balance Dynamic Sitting - Level of Assistance: 5: Stand by assistance Static Standing Balance Static Standing - Level of Assistance: 5: Stand by assistance Dynamic Standing Balance Dynamic Standing - Level of Assistance: 4: Min assist  Extremity/Trunk Assessment RUE Assessment RUE Assessment: Within Functional Limits (patient with increased pain in shoulder which limits AROM) LUE Assessment LUE Assessment: Exceptions to WFL LUE AROM (degrees) LUE Overall AROM Comments: limited shoulder AROM, patient only able to flex shoulder ~20* LUE PROM (degrees) LUE Overall PROM Comments: PROM is WFL LUE Strength LUE Overall Strength Comments: overall strength =4/5  FIM:  FIM - Eating Eating Activity: 0: Activity did not occur FIM - Grooming Grooming Steps: Wash, rinse, dry face;Wash, rinse, dry hands;Oral care, brush teeth, clean dentures Grooming: 5: Set-up assist to obtain items FIM - Bathing Bathing: 0: Activity did not occur FIM - Upper Body Dressing/Undressing Upper body dressing/undressing steps patient completed: Thread/unthread right sleeve of pullover shirt/dresss;Thread/unthread left sleeve of pullover shirt/dress;Put head through opening of pull over shirt/dress Upper body dressing/undressing: 4: Min-Patient completed 75 plus % of tasks FIM - Lower Body  Dressing/Undressing Lower body dressing/undressing steps patient completed: Thread/unthread right pants leg;Thread/unthread left pants leg;Pull pants up/down Lower body dressing/undressing: 2: Max-Patient completed 25-49% of tasks FIM - Toileting Toileting: 0: Activity did not occur FIM - Bed/Chair Transfer Bed/Chair Transfer: 4: Supine > Sit: Min A (steadying Pt. > 75%/lift 1 leg);4: Bed > Chair or W/C: Min A (steadying Pt. > 75%) FIM - Archivist Transfers: 0-Activity did not occur FIM - Tub/Shower Transfers Tub/shower Transfers: 0-Activity did not occur or was simulated   Refer to Care Plan for Long Term Goals  Recommendations for other services: None at this time.   Discharge Criteria: Patient will be discharged from OT if patient refuses treatment 3 consecutive times without medical reason, if treatment goals not met, if there is a change in medical status, if patient makes no progress towards goals or if patient is discharged from hospital.  The above assessment, treatment plan, treatment alternatives and goals were discussed and mutually agreed upon: by patient  --------------------------------------------------------------------------------------------------------------------------------------------  SESSION NOTES  Session #1 [phone] - 65 Minutes Individual Therapy Patient with 4/10 complaints of pain in right shoulder, notified RN and donned ice at end of session Initial 1:1 occupational therapy evaluation completed. Focused skilled intervention on bed mobility, adhering to back precautions, dynamic sitting balance/olerance/endurance seated EOB, UB/LB dressing, sit<>stand with RW, functional ambulation/mobility with RW, pain management, overall activity tolerance/endurance, and grooming tasks seated at sink in w/c. Therapist administered ice > patient's right shoulder to help with pain management. Patient's son present for most of session and seemed supportive.  Patient left seated in w/c beside bed with all needed items within reach.   Session #2 [id number] - 30 Minutes Individual Therapy No complaints of pain Upon entering room, patient found seated in w/c. Patient stood with RW and ambulated <>bathroom with steady assist, steady assist for transfer on/off elevated toilet seat. Therapist administered shorter RW for patient secondary to patient's complaints of shoulder pain. Therapist also administered TEDs and donned bilateral knee high TEDs. Patient then transferred > EOB for RN to change dressing, RN stated no showers at this time due to open wound. Patient then laid supine with moderate assistance (therapist assisting with BLEs). Lunch tray arrived and patient stated he didn't  want to eat, therapist educated patient on importance of nutrition. Talked with patient and RN about an appetite stimulant. At end of session, left patient supine in bed with all needed items within reach.    Carrieanne Kleen 05/28/2013, 3:46 PM

## 2013-05-28 NOTE — Progress Notes (Signed)
INITIAL NUTRITION ASSESSMENT  DOCUMENTATION CODES Per approved criteria  -Severe malnutrition in the context of chronic illness   INTERVENTION: 1.  Supplements; Continue Ensure Complete PO TID, each supplement provides 350 kcal and 13 grams of protein  NUTRITION DIAGNOSIS: Malnutrition related to inadequate oral intake as evidenced by severe depletion of muscle mass and 19% weight loss in 3 months.   Monitor:  1.  Food/Beverage; pt meeting >/=90% estimated needs with tolerance. 2.  Wt/wt change; monitor trends  Reason for Assessment: MD Consult for assessment of nutrition requirements/status  68 y.o. male  Admitting Dx: deconditioning  ASSESSMENT: Patient is a 68 y.o. male with a Past Medical History of , recent back surgery complicated by a wound infection, malnutrition, diarrhea, laryngitis, multiple hospitalizations over the past few months. Pt now admitted to rehab.   Pt with 14% wt loss over the past 3 months.  Pt in therapy at time of visit.   Per recent assessment by RD- Patient reports that he has a very poor appetite and has been eating poorly for the past few months and minimally for the past few days.   Nutrition Focused Physical Exam: Subcutaneous Fat:  Orbital Region: WNL Upper Arm Region: mild depletion Thoracic and Lumbar Region: NA  Muscle:  Temple Region: mild depletion Clavicle Bone Region: severe depletion Clavicle and Acromion Bone Region: severe depletion Scapular Bone Region: NA Dorsal Hand: mild depletion Patellar Region: moderate depletion Anterior Thigh Region: severe depletion Posterior Calf Region: moderate depletion  Edema: none  Pt meets criteria for severe MALNUTRITION in the context of chronic illness as evidenced by severe depletion of muscle mass and 19% weight loss in 3 months.  Pt intake is currently variable at 25-100% of meals. Pt is ordered Ensure Complete TID.  He has consumed 1 Ensure so far today. Family came for lunch today,  planning to return for lunch tomorrow.   Height: Ht Readings from Last 1 Encounters:  05/23/13 5' 6"  (1.676 m)    Weight: Wt Readings from Last 1 Encounters:  05/28/13 132 lb 0.9 oz (59.9 kg)    Ideal Body Weight: 64.5 kg  % Ideal Body Weight: 92%  Wt Readings from Last 10 Encounters:  05/28/13 132 lb 0.9 oz (59.9 kg)  05/27/13 136 lb 2.1 oz (61.75 kg)  05/18/13 130 lb (58.968 kg)  05/06/13 143 lb 4.8 oz (65 kg)  05/05/13 141 lb (63.957 kg)  05/01/13 144 lb (65.318 kg)  04/12/13 137 lb (62.143 kg)  04/12/13 138 lb 8 oz (62.823 kg)  03/16/13 158 lb 4.6 oz (71.8 kg)  03/16/13 158 lb 4.6 oz (71.8 kg)    Usual Body Weight: 158 lb  % Usual Body Weight: 81%  BMI:  Body mass index is 21.32 kg/(m^2).  Estimated Nutritional Needs: Kcal: 1800-2000 Protein: 100-110 gm Fluid: 1.8-2 L  Skin: back wound from recent back surgery  Diet Order: Cardiac with Ensure Complete TID  EDUCATION NEEDS: -Education needs addressed, discussed ways to increase protein and calorie intake.   Intake/Output Summary (Last 24 hours) at 05/28/13 1250 Last data filed at 05/28/13 0730  Gross per 24 hour  Intake    360 ml  Output    500 ml  Net   -140 ml    Last BM: 2/20  Labs:   Recent Labs Lab 05/25/13 0749 05/26/13 0625 05/27/13 0406 05/27/13 1837  NA 132* 130* 132*  --   K 3.4* 3.6* 4.2  --   CL 99 98 96  --  CO2 22 25 28   --   BUN 9 7 7   --   CREATININE 0.47* 0.58 0.58 0.49*  CALCIUM 7.9* 7.8* 7.8*  --   GLUCOSE 103* 91 92  --     CBG (last 3)   Recent Labs  05/27/13 1139 05/27/13 1651 05/27/13 2127  GLUCAP 113* 86 124*    Scheduled Meds: . aspirin EC  81 mg Oral q morning - 10a  . cholecalciferol  1,000 Units Oral Daily  . colchicine  0.6 mg Oral BID  . docusate sodium  100 mg Oral BID  . enoxaparin (LOVENOX) injection  40 mg Subcutaneous Q24H  . feeding supplement (ENSURE COMPLETE)  237 mL Oral TID WC  . FLUoxetine  40 mg Oral BID  . fluticasone  2 spray  Each Nare QHS  . gabapentin  600 mg Oral BID  . guaiFENesin  1,200 mg Oral BID  . losartan  50 mg Oral Daily  . metoprolol tartrate  25 mg Oral BID  . OxyCODONE  10 mg Oral Q12H  . pantoprazole  40 mg Oral BID  . predniSONE  5 mg Oral QPM  . simvastatin  40 mg Oral Q lunch  . tiotropium  18 mcg Inhalation Daily    Continuous Infusions:    Past Medical History  Diagnosis Date  . Hx of colonic polyps   . Diverticulosis   . Hypertension   . 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 CAD (coronary artery disease)     minimal coronary plaque in the LAD and right coronary system. 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 7-06. Last nuclear stress 11-17-06 fixed anterior/ inferior defect, no inducible ischemia, EF 81%  . Allergic rhinitis   . Hx of colonoscopy   . PONV (postoperative nausea and vomiting)   . Dysrhythmia 01-24-12    past hx. A.Fib x1 episode-responded to med.  . Depression   . Anxiety     pt. feels its related to the medicine that he takes   . GERD (gastroesophageal reflux disease)   . COPD (chronic obstructive pulmonary disease)     pt denies this hx on 05/24/2013  . Osteoarthritis   . RA (rheumatoid arthritis)   . Chronic back pain     Past Surgical History  Procedure Laterality Date  . Appendectomy    . Total knee arthroplasty Right 2002     partial  . Shoulder hemi-arthroplasty Left 2008    partial  . Foot surgery Right 11-08    for removal of bone spurs-  . Ankle reconstruction Right 6-09    [facility]  . Posterior lumbar fusion  12-10    L4-5 diskectomy w/ fusion, cage placement and rods; [place]  . Partial knee arthroplasty Left   . Total shoulder replacement Right   . Joint replacement    . Cataract extraction, bilateral Bilateral 01-24-12  . Orif shoulder fracture  02/06/2012    Procedure: OPEN REDUCTION INTERNAL FIXATION (ORIF) SHOULDER  FRACTURE;  Surgeon: Nita Sells, MD;  Location: [facility];  Service: Orthopedics;  Laterality: Left;  ORIF of a Left Shoulder Fracture with  Iliac Crest Bone Graft aspiration   . Harvest bone graft  02/06/2012    Procedure: HARVEST ILIAC BONE GRAFT;  Surgeon: Nita Sells, MD;  Location: [facility];  Service: Orthopedics;;  bone marrow aspirqation   . Hardware  removal  03/09/2012    Procedure: HARDWARE REMOVAL;  Surgeon: Nita Sells, MD;  Location: [place];  Service: Orthopedics;  Laterality: Left;  Hardware Removal from Left Shoulder  . Shoulder arthroscopy Right 2013  . Carpal tunnel release Right 1990's  . Total ankle replacement Right 2008    at [facility]  . Hammer toe surgery Right 07/2012    "broke 4 hammertoes"   . Posterior lumbar fusion 4 level N/A 03/02/2013    Procedure: Lumbar One to Sacral One Posterior lumbar interbody fusion;  Surgeon: Floyce Stakes, MD;  Location: [place] [facility];  Service: Neurosurgery;  Laterality: N/A;  L1 to S1 Posterior lumbar interbody fusion  . Lumbar wound debridement N/A 03/17/2013    Procedure: Incision and drainage of superficial lumbar wound;  Surgeon: Floyce Stakes, MD;  Location: [place] [facility];  Service: Neurosurgery;  Laterality: N/A;  Incision and drainage of superficial lumbar wound  . Tonsillectomy    . Hernia repair Bilateral   . Coronary angioplasty with stent placement  2006    "while repairing 1st stent, a second area tore and they had to place 2nd stent " ?LAD & CX    Brynda Greathouse, MS RD LDN Clinical Inpatient Dietitian Pager: [phone] Weekend/After hours pager: [phone]

## 2013-05-29 ENCOUNTER — Inpatient Hospital Stay (HOSPITAL_COMMUNITY): Payer: Medicare Other

## 2013-05-29 ENCOUNTER — Inpatient Hospital Stay (HOSPITAL_COMMUNITY): Payer: Medicare Other | Admitting: Speech Pathology

## 2013-05-29 MED ORDER — SORBITOL 70 % SOLN
60.0000 mL | Status: AC
  Administered 2013-05-29: 60 mL via ORAL
  Filled 2013-05-29: qty 60

## 2013-05-29 MED ORDER — WHITE PETROLATUM GEL
Status: AC
  Administered 2013-05-29: 23:00:00
  Filled 2013-05-29: qty 5

## 2013-05-29 MED ORDER — CLONIDINE HCL 0.1 MG PO TABS
0.1000 mg | ORAL_TABLET | Freq: Three times a day (TID) | ORAL | Status: DC | PRN
  Filled 2013-05-29: qty 1

## 2013-05-29 MED ORDER — HYDROCHLOROTHIAZIDE 25 MG PO TABS
25.0000 mg | ORAL_TABLET | Freq: Every day | ORAL | Status: DC
  Filled 2013-05-29 (×2): qty 1

## 2013-05-29 NOTE — Progress Notes (Signed)
Physical Therapy Session Note  Patient Details  Name: [name] MRN: [id number] Date of Birth: July 14, 1945  Today's Date: 05/29/2013 Time: 6979-4801 Time Calculation (min): 60 min  Short Term Goals: Week 1:  PT Short Term Goal 1 (Week 1): =LTG  Skilled Therapeutic Interventions/Progress Updates:  Pt received supine in bed reporting ongoing nausea that occurred through the night. Pt reported vomiting x1. Pt agreeable to trying supine there ex. Pt completed there ex, including ankle pumps, quad sets, glut sets, SAQ, hip abd/add, heel slides, and LAQ, completing 1x20. Pt able to complete actively, requiring increased time to complete. Pt able to complete supine to sit tsf at supervision level and don LSO with supervision/set up. Pt attempted gait training in hallways, completing a short distance, x 60 feet with RW, requiring min A for safety and weakness noted in BLE. Pt reported increased in nausea and dizziness noted. Pt returned to room and assisted into supine. Vitals noted below and nsg made aware, as pt reported onset of HA with increased in BP. Pt left in supine with all needs in reach and bed alarm on.   Therapy Documentation Precautions:  Precautions Precautions: Back Type of Shoulder Precautions: Pt able to state 3/3 precautions with min verbal cueing Precaution Comments: pt verbalized understanding of 3/3 back precautions Required Braces or Orthoses:  (patient wears spinal brace but no brace ordered) Spinal Brace: Lumbar corset;Applied in sitting position Restrictions Weight Bearing Restrictions: No General:   Vital Signs: Therapy Vitals Temp: 98.2 F (36.8 C) Temp src: Oral Pulse Rate: 85 BP: 171/100 mmHg Patient Position, if appropriate: Lying Oxygen Therapy SpO2: 96 % O2 Device: None (Room air)  RN notified of increased BP and HA that occurred at end of session.  Pain: Pain Assessment Pain Assessment: 0-10 Pain Score: 6  Pain Type: Chronic pain Pain  Location: Back Pain Descriptors / Indicators: Aching Patients Stated Pain Goal: 0 Pain Intervention(s): Repositioned  See FIM for current functional status  Therapy/Group: Individual Therapy  [name] R 05/29/2013, 11:51 AM

## 2013-05-29 NOTE — Progress Notes (Signed)
Soap Suds Enema not given. Pt had several BM post Sorbitol treatment. 2 large reported by technician, first large formed, second loose unformed. 1 large unformed "blow-out" reported by PT.  Destenie Ingber, RN

## 2013-05-29 NOTE — IPOC Note (Signed)
Overall Plan of Care [facility]) Patient Details Name: [name] MRN: [id number] DOB: 06/03/1945  Admitting Diagnosis: Deconditioning Laminectomy  Hospital Problems: Active Problems:   Lumbar post-laminectomy syndrome     Functional Problem List: Nursing Bladder;Bowel;Pain;Safety;Skin Integrity  PT Balance;Endurance;Motor;Pain;Safety;Skin Integrity  OT Balance;Endurance;Pain;Perception;Safety;Skin Integrity  SLP    TR         Basic ADL's: OT Grooming;Bathing;Dressing;Toileting     Advanced  ADL's: OT  (n/a at this time)     Transfers: PT Bed Mobility;Bed to Chair;Car;Furniture  OT Toilet;Tub/Shower     Locomotion: PT Ambulation;Stairs     Additional Impairments: OT None (pain management > right shoulder)  SLP        TR      Anticipated Outcomes Item Anticipated Outcome  Self Feeding Independent  Swallowing      Basic self-care  supervision  Toileting  supervision   Bathroom Transfers supervision  Bowel/Bladder  Pt. will remain continent to bowel and bladder.  Transfers  Supervision with basic transfers  Locomotion  Supervision ambulatory level; Supervision stairs  Communication     Cognition     Pain  Pain level< than 3 0n scale 1 to 10  Safety/Judgment  Keep pt. skin intact,without pressure sores.Follow wound nurse instructions for dressing changes on lower back incision on M,W,F   Therapy Plan: PT Intensity: Minimum of 1-2 x/day ,45 to 90 minutes PT Frequency: 5 out of 7 days PT Duration Estimated Length of Stay: 7-10 days OT Intensity: Minimum of 1-2 x/day, 45 to 90 minutes OT Frequency: 5 out of 7 days OT Duration/Estimated Length of Stay: 7-10 days SLP Frequency: 5 out of 7 days       Team Interventions: Nursing Interventions    PT interventions Ambulation/gait training;Balance/vestibular training;Community reintegration;Discharge planning;Disease management/prevention;DME/adaptive equipment instruction;Functional mobility  training;Neuromuscular re-education;Pain management;Patient/family education;Skin care/wound management;Stair training;Therapeutic Activities;Therapeutic Exercise;UE/LE Strength taining/ROM;UE/LE Coordination activities;Wheelchair propulsion/positioning  OT Interventions Balance/vestibular training;Community reintegration;Discharge planning;DME/adaptive equipment instruction;Functional mobility training;Pain management;Patient/family education;Psychosocial support;Self Care/advanced ADL retraining;Skin care/wound managment;Therapeutic Activities;Therapeutic Exercise;UE/LE Strength taining/ROM;UE/LE Coordination activities;Wheelchair propulsion/positioning  SLP Interventions Cueing hierarchy;Therapeutic Activities;Therapeutic Exercise;Patient/family education;Functional tasks;Neuromuscular electrical stimulation;Speech/Language facilitation (will determine benefit from NMES for voice therapy)  TR Interventions    SW/CM Interventions      Team Discharge Planning: Destination: PT-Home ,OT- Home , SLP-  Projected Follow-up: PT-Home health PT, OT-  Home health OT, SLP-Outpatient SLP (outpatient SLP-voice ) Projected Equipment Needs: PT-None recommended by PT, OT- Tub/shower seat, SLP-None recommended by SLP Equipment Details: PT-patient already owns rolling walker and w/c, OT-  Patient/family involved in discharge planning: PT- Patient,  OT-Patient, SLP-Patient  MD ELOS: 7-10 days Medical Rehab Prognosis:  Excellent Assessment: The patient has been admitted for CIR therapies. The team will be addressing, functional mobility, strength, stamina, balance, safety, adaptive techniques/equipment, self-care, bowel and bladder mgt, patient and caregiver education, pain mgt, back precautions, wound care. Goals have been set at supervision for most self-care and mobility tasks.    Meredith Staggers, MD, FAAPMR      See Team Conference Notes for weekly updates to the plan of care

## 2013-05-29 NOTE — Progress Notes (Signed)
Occupational Therapy Session Note  Patient Details  Name: [name] MRN: [id number] Date of Birth: [birth year]  Today's Date: 05/29/2013 Time: 0900-0911 Time Calculation (min): 11 min  Short Term Goals: Week 1:  OT Short Term Goal 1 (Week 1): Short Term Goals = Long Term Goals  Skilled Therapeutic Interventions/Progress Updates: Patient received in bed, reporting nausea with intermittent emesis (w/bath pan at his side) and light-headedness.   BP/HR/O2 assessed as follows: 170/97, 76 bpm, 96%. OT advised patient that he was not on bed rest at time of treatment and would be expected to participate however patient refused session d/t illness, stating his symptoms were most unusual for him and he knew he could not tolerate many ovement or change in position (supine) at time of treatment.  RN made aware.    Therapy Documentation Precautions:  Precautions Precautions: Back Type of Shoulder Precautions: Pt able to state 3/3 precautions with min verbal cueing Precaution Comments: pt verbalized understanding of 3/3 back precautions Required Braces or Orthoses:  (patient wears spinal brace but no brace ordered) Spinal Brace: Lumbar corset;Applied in sitting position Restrictions Weight Bearing Restrictions: No  General: General Amount of Missed OT Time (min): 49 Minutes  Vital Signs: Therapy Vitals Temp: 98.2 F (36.8 C) Temp src: Oral Pulse Rate: 74 BP: 169/94 mmHg Oxygen Therapy SpO2: 96 % O2 Device: None (Room air)  Pain: Pain Assessment Pain Assessment: 0-10 Pain Score: 6  Pain Location: Back  See FIM for current functional status  Therapy/Group: Individual Therapy  Second session: Time: 0034-9179 Time Calculation (min):  45 min  Pain Assessment: No pain  Skilled Therapeutic Interventions: Patient received still in his bed although alert and receptive to treatment.   Patient requested assist with toilet transfer and toileting.   With extra time, use of bed  rail, and mod assist to lift upper trunk, patient performed roll to right and sat up at edge of bed, supported by bed rail with R-UE.   Patient denied need for back brace (no order) stating brace was used for comfort and he was not having pain.   Patient transferred from edge of bed to w/c with manual facilitation (mod assist, lifting)  to rise to stand, and steadying assist as he pivoted and turned to lower himself to w/c.    Patient's bed was noted to be heavily soiled with loose bowel from diarrhea, although patient reiterated that he still needed to complete voiding. Patient performed similar transfer from w/c to toilet to complete voiding with total assist to manage clothing and perform hygiene as patient stood, supported by grab bar.   Patient returned to w/c and completed lower body bathing with total assist.   Patient donned t-shirt with min assist to pull his head through openning but required total assist to don new diaper and pants.   Patient returned to w/c, sitting beside his bed, call light placed within reach.   Throughout session, patient's speech was consistently a soft whisper with a mild delay in response to questions and prompts.  See FIM for current functional status  Therapy/Group: Individual Therapy  [name] 05/29/2013, 9:58 AM

## 2013-05-29 NOTE — Progress Notes (Signed)
Speech Language Pathology Daily Session Note  Patient Details  Name: [name] MRN: [id number] Date of Birth: [birth year]  Today's Date: 05/29/2013 Time: 1300-1330 Time Calculation (min): 30 min  Short Term Goals: Week 1: SLP Short Term Goal 1 (Week 1): Patient will perform diaphragmatic breathing and respiratory support and control exercises with minimal cues. SLP Short Term Goal 2 (Week 1): Patient will perform vocal adduction and sustained phonation exercises with minimal cues. SLP Short Term Goal 3 (Week 1): SLP will determine benefit from NMES for use in voice treatment.  Skilled Therapeutic Interventions: Skilled ST intervention provided with focus on cognitive goals. Slp provided patient education on the methods and benefits on diaphragmatic breathing. Slp repositioned pt in bed, until laying flat. Slp provided visual model of proper techniques for diaphragmatic breathing. Pt demonstrated breathing techniques x10 breaths, taking rest breaks as needed. Pt required moderate multimodal cuing to perform technique. Pt able to recall 1 benefit of diaphragmatic breathing, further education required.    FIM:  Comprehension Comprehension Mode: Auditory Comprehension: 6-Follows complex conversation/direction: With extra time/assistive device Expression Expression Mode: Verbal Expression: 6-Expresses complex ideas: With extra time/assistive device Social Interaction Social Interaction: 6-Interacts appropriately with others with medication or extra time (anti-anxiety, antidepressant). Problem Solving Problem Solving: 5-Solves basic 90% of the time/requires cueing < 10% of the time Memory Memory: 6-Assistive device: No helper  Pain Pain Assessment Pain Assessment: No/denies pain Pain Score: 7  Pain Type: Chronic pain;Surgical pain Pain Location: Back Pain Frequency: Constant Pain Intervention(s): Medication (See eMAR);Repositioned  Therapy/Group: Individual  Therapy  [name], Bernerd Pho 05/29/2013, 1:33 PM

## 2013-05-29 NOTE — Progress Notes (Signed)
Subjective/Complaints: Had a lot of nausea yesterday. Still has some stomach upset today. No bm. Multiple incontinent voids however  A 12 point review of systems has been performed and if not noted above is otherwise negative.   Objective: Vital Signs: Blood pressure 176/100, pulse 70, temperature 100.3 F (37.9 C), temperature source Oral, resp. rate 16, weight 59.9 kg (132 lb 0.9 oz), SpO2 94.00%. No results found.  Recent Labs  05/27/13 1837  WBC 5.2  HGB 11.6*  HCT 34.3*  PLT 346    Recent Labs  05/27/13 0406 05/27/13 1837  NA 132*  --   K 4.2  --   CL 96  --   GLUCOSE 92  --   BUN 7  --   CREATININE 0.58 0.49*  CALCIUM 7.8*  --    CBG (last 3)   Recent Labs  05/27/13 1139 05/27/13 1651 05/27/13 2127  GLUCAP 113* 86 124*    Wt Readings from Last 3 Encounters:  05/28/13 59.9 kg (132 lb 0.9 oz)  05/27/13 61.75 kg (136 lb 2.1 oz)  05/18/13 58.968 kg (130 lb)    Physical Exam:  HENT: oral mucosa pink and moist, voice dysphonic Head: Normocephalic.  Eyes: EOM are normal.  Neck: Normal range of motion. Neck supple. No thyromegaly present.  Cardiovascular: Normal rate and regular rhythm.  Respiratory: Effort normal and breath sounds normal. No respiratory distress.  GI: Soft. Bowel sounds are normal. He exhibits mild distension.    Patient was able to state his name and age. He was a poor medical historian , place is [place]  Skin: dipahoretic Back incision is clean with granulation tissue noted, lumbar incision approximately 1 cm deep without drainage, dressing in place  Neuro:  Motor strength remains 4/5 bilateral deltoid, bicep, tricep, grip, hip flexor, knee extensors, ankle dorsiflexor plantar flexion  Sensory intact to light touch in bilateral upper and lower limb Psych: pleasant and appropriate  Assessment/Plan: 1. Functional deficits secondary to Lumbar stenosis s/p L1-S1 fusion which require 3+ hours per day of interdisciplinary  therapy in a comprehensive inpatient rehab setting. Physiatrist is providing close team supervision and 24 hour management of active medical problems listed below. Physiatrist and rehab team continue to assess barriers to discharge/monitor patient progress toward functional and medical goals. FIM: FIM - Bathing Bathing: 0: Activity did not occur  FIM - Upper Body Dressing/Undressing Upper body dressing/undressing steps patient completed: Thread/unthread right sleeve of pullover shirt/dresss;Thread/unthread left sleeve of pullover shirt/dress;Put head through opening of pull over shirt/dress Upper body dressing/undressing: 4: Min-Patient completed 75 plus % of tasks FIM - Lower Body Dressing/Undressing Lower body dressing/undressing steps patient completed: Thread/unthread right pants leg;Thread/unthread left pants leg;Pull pants up/down Lower body dressing/undressing: 2: Max-Patient completed 25-49% of tasks  FIM - Toileting Toileting steps completed by patient: Adjust clothing prior to toileting;Performs perineal hygiene;Adjust clothing after toileting Toileting Assistive Devices: Grab bar or rail for support Toileting: 4: Steadying assist  FIM - Radio producer Devices: Elevated toilet seat;Grab bars;Walker Toilet Transfers: 4-To toilet/BSC: Min A (steadying Pt. > 75%);4-From toilet/BSC: Min A (steadying Pt. > 75%)  FIM - Control and instrumentation engineer Devices: Walker;Arm rests Bed/Chair Transfer: 5: Sit > Supine: Supervision (verbal cues/safety issues);5: Supine > Sit: Supervision (verbal cues/safety issues);4: Bed > Chair or W/C: Min A (steadying Pt. > 75%);4: Chair or W/C > Bed: Min A (steadying Pt. > 75%)  FIM - Locomotion: Wheelchair Locomotion: Wheelchair: 1:  Travels less than 50 ft with minimal assistance (Pt.>75%) FIM - Locomotion: Ambulation Locomotion: Ambulation Assistive Devices: Walker - Rolling Locomotion: Ambulation: 2:  Travels 50 - 149 ft with minimal assistance (Pt.>75%)  Comprehension Comprehension Mode: Auditory Comprehension: 6-Follows complex conversation/direction: With extra time/assistive device  Expression Expression Mode: Verbal Expression: 6-Expresses complex ideas: With extra time/assistive device  Social Interaction Social Interaction: 6-Interacts appropriately with others with medication or extra time (anti-anxiety, antidepressant).  Problem Solving Problem Solving: 6-Solves complex problems: With extra time  Memory Memory: 7-Complete Independence: No helper Medical Problem List and Plan:  1. 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 Patient with history of multiple back surgeries  2. DVT Prophylaxis/Anticoagulation: Subcutaneous Lovenox. Monitor platelet counts and any signs of bleeding  3. Pain Management: Neurontin 600 mg twice a day, OxyContin sustained-release 10 mg every 12 hours, Dilaudid 4 mg every 8 hours moderate to severe pain   -controlled at present 4. Mood/depression. Prozac 40 mg twice a day, Xanax 0.5 mg 3 times a day as needed. Provide emotional support and  5. Neuropsych: This patient is not capable of making decisions on his own behalf.  6. Wound. Followup wound care nurse with dressings to back wound as directed  7. Hoarseness of voice after recent back surgery. Followup ENT Dr. Thera Flake.   normal scope/laryngoscopic exam. Continued followup speech therapy. Voice quality improving slowly 8. Rheumatoid arthritis. Chronic prednisone therapy.? resume Humira 40 mg subcutaneous every 14 days as prior to admission  9. Hypertension. Cozaar 50 mg daily, Lopressor 25 mg twice a day. rxing #12 should help---follow for now. 10. Hyperlipidemia. Zocor.  11. COPD. Spiriva daily, albuterol nebulizer every 2 hours as needed. Check oxygen saturations every shift  12. Nausea--  -check KUB  -sorbitol today 13. Urinary incontinence  -recent ucx  neg. Consider rechecking if sx recur     LOS (Days) 2 A FACE TO FACE EVALUATION WAS PERFORMED  SWARTZ,ZACHARY T 05/29/2013 8:26 AM

## 2013-05-29 NOTE — Progress Notes (Signed)
Pt status non-progressing. States he does not feel well, however unable to give full detail of symptoms. Rhonci in Lower lobes, respiratory therapy contacted for nebulizer administration awaiting arrival. B/p remained elevated, 171/100 @ 3143, 169/94 @ 0926, 196/112 @ 1758, 194/118 @ 1930. On-call MD made aware of patient status and new orders received. Oxygen satruation 92% on RA @ 1930. Continue with plan of care. Tesneem Dufrane, RN

## 2013-05-30 ENCOUNTER — Inpatient Hospital Stay (HOSPITAL_COMMUNITY): Payer: Medicare Other

## 2013-05-30 LAB — CULTURE, BLOOD (ROUTINE X 2)
Culture: NO GROWTH
Culture: NO GROWTH

## 2013-05-30 LAB — CBC
HCT: 37 % — ABNORMAL LOW (ref 39.0–52.0)
Hemoglobin: 13.3 g/dL (ref 13.0–17.0)
MCH: 28.7 pg (ref 26.0–34.0)
MCHC: 35.9 g/dL (ref 30.0–36.0)
MCV: 79.9 fL (ref 78.0–100.0)
Platelets: 390 10*3/uL (ref 150–400)
RBC: 4.63 MIL/uL (ref 4.22–5.81)
RDW: 14.4 % (ref 11.5–15.5)
WBC: 16.1 10*3/uL — ABNORMAL HIGH (ref 4.0–10.5)

## 2013-05-30 LAB — BASIC METABOLIC PANEL
BUN: 7 mg/dL (ref 6–23)
BUN: 8 mg/dL (ref 6–23)
BUN: 8 mg/dL (ref 6–23)
CO2: 28 mEq/L (ref 19–32)
CO2: 27 mEq/L (ref 19–32)
CO2: 26 mEq/L (ref 19–32)
Calcium: 8.3 mg/dL — ABNORMAL LOW (ref 8.4–10.5)
Calcium: 8.3 mg/dL — ABNORMAL LOW (ref 8.4–10.5)
Calcium: 7.7 mg/dL — ABNORMAL LOW (ref 8.4–10.5)
Chloride: 79 mEq/L — ABNORMAL LOW (ref 96–112)
Chloride: 78 mEq/L — ABNORMAL LOW (ref 96–112)
Chloride: 80 mEq/L — ABNORMAL LOW (ref 96–112)
Creatinine, Ser: 0.54 mg/dL (ref 0.50–1.35)
Creatinine, Ser: 0.54 mg/dL (ref 0.50–1.35)
Creatinine, Ser: 0.47 mg/dL — ABNORMAL LOW (ref 0.50–1.35)
GFR calc Af Amer: >90 mL/min (ref >90)
GFR calc Af Amer: >90 mL/min (ref >90)
GFR calc Af Amer: >90 mL/min (ref >90)
GFR calc non Af Amer: >90 mL/min (ref >90)
GFR calc non Af Amer: >90 mL/min (ref >90)
GFR calc non Af Amer: >90 mL/min (ref >90)
Glucose, Bld: 117 mg/dL — ABNORMAL HIGH (ref 70–99)
Glucose, Bld: 161 mg/dL — ABNORMAL HIGH (ref 70–99)
Glucose, Bld: 124 mg/dL — ABNORMAL HIGH (ref 70–99)
Potassium: 4.1 mEq/L (ref 3.7–5.3)
Potassium: 3.5 mEq/L — ABNORMAL LOW (ref 3.7–5.3)
Potassium: 3.6 mEq/L — ABNORMAL LOW (ref 3.7–5.3)
Sodium: 117 mEq/L — CL (ref 137–147)
Sodium: 117 mEq/L — CL (ref 137–147)
Sodium: 117 mEq/L — CL (ref 137–147)

## 2013-05-30 LAB — SODIUM, URINE, RANDOM: Sodium, Ur: 106 mEq/L

## 2013-05-30 LAB — OSMOLALITY, URINE: Osmolality, Ur: 598 mOsm/kg (ref 390–1090)

## 2013-05-30 MED ORDER — SODIUM CHLORIDE 3 % IV SOLN: INTRAVENOUS | Status: DC

## 2013-05-30 MED ORDER — GADOBENATE DIMEGLUMINE 529 MG/ML IV SOLN
12.0000 mL | Freq: Once | INTRAVENOUS | Status: AC | PRN
  Administered 2013-05-30: 12 mL via INTRAVENOUS

## 2013-05-30 MED ORDER — SODIUM CHLORIDE 1 G PO TABS
1.0000 g | ORAL_TABLET | Freq: Two times a day (BID) | ORAL | Status: DC
  Administered 2013-05-30 – 2013-06-02 (×6): 1 g via ORAL
  Filled 2013-05-30 (×8): qty 1

## 2013-05-30 MED ORDER — VANCOMYCIN HCL IN DEXTROSE 750-5 MG/150ML-% IV SOLN
750.0000 mg | Freq: Three times a day (TID) | INTRAVENOUS | Status: DC
  Administered 2013-05-30 – 2013-06-05 (×19): 750 mg via INTRAVENOUS
  Filled 2013-05-30 (×22): qty 150

## 2013-05-30 MED ORDER — DEMECLOCYCLINE HCL 150 MG PO TABS
300.0000 mg | ORAL_TABLET | Freq: Two times a day (BID) | ORAL | Status: DC
  Administered 2013-05-30 – 2013-06-02 (×6): 300 mg via ORAL
  Filled 2013-05-30 (×9): qty 2

## 2013-05-30 MED ORDER — SODIUM CHLORIDE 0.9 % IV SOLN
INTRAVENOUS | Status: DC
  Administered 2013-05-31 – 2013-06-01 (×2): via INTRAVENOUS

## 2013-05-30 MED ORDER — SODIUM CHLORIDE 0.9 % IV SOLN
INTRAVENOUS | Status: DC
  Administered 2013-05-30: 10:00:00 via INTRAVENOUS

## 2013-05-30 NOTE — Progress Notes (Signed)
Social Work  Social Work Assessment and Plan  Patient Details  Name: [name] MRN: [id number] Date of Birth: 1945/06/02  Today's Date: 05/30/2013  Problem List:  Patient Active Problem List   Diagnosis Date Noted  . Lumbar post-laminectomy syndrome 05/27/2013  . Degenerative arthritis of spine 05/26/2013  . Hemochromatosis 05/26/2013  . Wound infection after surgery 05/26/2013  . Normocytic anemia 05/26/2013  . Unintentional weight loss 05/26/2013  . Protein-calorie malnutrition, severe 05/25/2013  . Hyponatremia 05/23/2013  . SIRS (systemic inflammatory response syndrome) 05/23/2013  . Dehydration 05/23/2013  . Fever 05/23/2013  . Chronic narcotic use 05/23/2013  . Opioid overdose 05/06/2013  . Acute respiratory failure with hypoxia 05/06/2013  . COPD (chronic obstructive pulmonary disease) 05/06/2013  . Acute renal failure 05/06/2013  . Acute encephalopathy 05/06/2013  . Frequent falls 05/06/2013  . Encounter for postoperative wound care 05/06/2013  . Atrial fibrillation 08/07/2010  . Rheumatoid arthritis(714.0) 10/18/2008  . DYSLIPIDEMIA 03/30/2007  . PSEUDOGOUT 03/30/2007  . HYPERTENSION 03/30/2007  . CAD 03/30/2007   Past Medical History:  Past Medical History  Diagnosis Date  . Hx of colonic polyps   . Diverticulosis   . Hypertension   . 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 CAD (coronary artery disease)     minimal coronary plaque in the LAD and right coronary system. 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 7-06. Last nuclear stress 11-17-06 fixed anterior/ inferior defect, no inducible ischemia, EF 81%  . Allergic rhinitis   . Hx of colonoscopy   . PONV (postoperative nausea and vomiting)   . Dysrhythmia 01-24-12    past hx. A.Fib x1 episode-responded to med.  . Depression   . Anxiety     pt. feels its related to the medicine that he  takes   . GERD (gastroesophageal reflux disease)   . COPD (chronic obstructive pulmonary disease)     pt denies this hx on 05/24/2013  . Osteoarthritis   . RA (rheumatoid arthritis)   . Chronic back pain    Past Surgical History:  Past Surgical History  Procedure Laterality Date  . Appendectomy    . Total knee arthroplasty Right 2002     partial  . Shoulder hemi-arthroplasty Left 2008    partial  . Foot surgery Right 11-08    for removal of bone spurs-  . Ankle reconstruction Right 6-09    [facility]  . Posterior lumbar fusion  12-10    L4-5 diskectomy w/ fusion, cage placement and rods; [facility]  . Partial knee arthroplasty Left   . Total shoulder replacement Right   . Joint replacement    . Cataract extraction, bilateral Bilateral 01-24-12  . Orif shoulder fracture  02/06/2012    Procedure: OPEN REDUCTION INTERNAL FIXATION (ORIF) SHOULDER FRACTURE;  Surgeon: Mable Paris, MD;  Location: [facility];  Service: Orthopedics;  Laterality: Left;  ORIF of a Left Shoulder Fracture with  Iliac Crest Bone Graft aspiration   . Harvest bone graft  02/06/2012    Procedure: HARVEST ILIAC BONE GRAFT;  Surgeon: Mable Paris, MD;  Location: [facility];  Service: Orthopedics;;  bone marrow aspirqation   . Hardware removal  03/09/2012    Procedure: HARDWARE REMOVAL;  Surgeon: Mable Paris, MD;  Location: [facility];  Service: Orthopedics;  Laterality: Left;  Hardware Removal from Left Shoulder  . Shoulder  arthroscopy Right 2013  . Carpal tunnel release Right 1990's  . Total ankle replacement Right 2008    at [facility]  . Hammer toe surgery Right 07/2012    "broke 4 hammertoes"   . Posterior lumbar fusion 4 level N/A 03/02/2013    Procedure: Lumbar One to Sacral One Posterior lumbar interbody fusion;  Surgeon: Karn Cassis, MD;  Location: [facility];  Service: Neurosurgery;  Laterality: N/A;  L1 to S1 Posterior lumbar interbody fusion  . Lumbar wound debridement  N/A 03/17/2013    Procedure: Incision and drainage of superficial lumbar wound;  Surgeon: Karn Cassis, MD;  Location: [facility];  Service: Neurosurgery;  Laterality: N/A;  Incision and drainage of superficial lumbar wound  . Tonsillectomy    . Hernia repair Bilateral   . Coronary angioplasty with stent placement  2006    "while repairing 1st stent, a second area tore and they had to place 2nd stent " ?LAD & CX   Social History:  reports that he has never smoked. He has never used smokeless tobacco. He reports that he does not drink alcohol or use illicit drugs.  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) Spouse/Significant Other: girlfriend, Rudean Hitt (together approx 4 yrs) @ (H) [phone] or (C) [phone] Children: daughter, Shirlean Kelly @ [phone] and son, Athony Beighley @ [phone] or [phone] and dtr-in-law, April Anticipated Caregiver: Eunice Blase and April ( dtr in Social worker) Ability/Limitations of Caregiver: Eunice Blase works 20 hrs per week at [employer] Caregiver Availability: 24/7 Family Dynamics: pt describes very good support from both children - no concerns about assist upon d/c  Social History Preferred language: English Religion: Non-Denominational Cultural Background: NA Education: college Read: Yes Write: Yes Employment Status: Employed Name of Employer: Naval architect, [employer]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 Legal Hisotry/Current Legal Issues: none Guardian/Conservator: none - per MD, pt not capable of making decisions on his own behalf, therefore, decisions to be shared by adult children   Abuse/Neglect Physical Abuse: Denies Verbal Abuse: Denies Sexual Abuse: Denies Exploitation of patient/patient's  resources: Denies Self-Neglect: Denies  Emotional Status Pt's affect, behavior adn adjustment status: Pt with some difficulty completing this interveiw due to his strained voicing.  Speaks openly about the stress this extended illness has had on his relationships with his girlfriend, children and business.  He is hopeful that he is at the beginning of a full recovery.  Describes himself as always being a "glass half full kind of guy" and states this illess has caused his overall outlook to "turn the other way."  Denies any significant s/s of depression, however, admits "it's not far away..." Recent Psychosocial Issues: None  Pyschiatric History: None Substance Abuse History: None  Patient / Family Perceptions, Expectations & Goals Pt/Family understanding of illness & functional limitations: pt and family with basic understanding of multiple medical issues he has battled over the past several months, current functional limitations and need for CIR Premorbid pt/family roles/activities: pt has been physcially limited for several months;  prior to Nov, 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 Anticipated changes in roles/activities/participation: son and dtr-in-law to assume primary caregiving roles Pt/family expectations/goals: Pt very focused on his longer term goal of being able to return  to his company in full capacity  [employer]: None Premorbid Home Care/DME Agencies: None Transportation available at discharge: yes Resource referrals recommended: Neuropsychology  Discharge Planning Living Arrangements: Spouse/significant other;Other (Comment) (Debbie and pt living together for 5 yrs; not married) Support Systems: Spouse/significant other;Children Type of Residence: Private residence Insurance Resources: Medicare (**[employer]) Financial Resources: Employment;Social Security Financial Screen Referred: No Living Expenses: Own Money Management:  Patient Does the patient have any problems obtaining your medications?: No Home Management: prior to Nov - pt and gf Patient/Family Preliminary Plans: Pt plans to d/c to his son's home initially Social Work Anticipated Follow Up Needs: HH/OP Expected length of stay: ELOS 14-20 days  Clinical Impression Pleasant gentleman here following extended illness and very deconditioned.  Speaks openly about the strains this illness has placed on his relationship with his gf, children and business.  Hopeful he is at the start of a full recovery.  Will follow for support and d/c planning needs.  Marcia Lepera 05/30/2013, 6:26 PM

## 2013-05-30 NOTE — Progress Notes (Signed)
Subjective/Complaints: Still somewhat nauseas, but just doesn't "feel well" today. Diaphoresis continues (although there is a degree of this at baseline). Had large bm yesterday with sorbitol  A 12 point review of systems has been performed and if not noted above is otherwise negative.   Objective: Vital Signs: Blood pressure 162/92, pulse 66, temperature 98.6 F (37 C), temperature source Oral, resp. rate 16, weight 59.9 kg (132 lb 0.9 oz), SpO2 94.00%. Dg Abd 1 View  05/29/2013   CLINICAL DATA:  Abdominal distention and pain. Nausea. Constipation.  EXAM: ABDOMEN - 1 VIEW  COMPARISON:  05/20/2013  FINDINGS: No evidence of dilated bowel loops. Decreased colonic stool seen since previous study. Lumbar spine fusion hardware again noted.  IMPRESSION: Decreased stool burden since previous study.  No acute findings.   Electronically Signed   By: Earle Gell M.D.   On: 05/29/2013 19:11    Recent Labs  05/27/13 1837 05/30/13 0700  WBC 5.2 16.1*  HGB 11.6* 13.3  HCT 34.3* 37.0*  PLT 346 390    Recent Labs  05/27/13 1837 05/30/13 0700  NA  --  117*  K  --  4.1  CL  --  79*  GLUCOSE  --  117*  BUN  --  7  CREATININE 0.49* 0.54  CALCIUM  --  8.3*   CBG (last 3)   Recent Labs  05/27/13 1139 05/27/13 1651 05/27/13 2127  GLUCAP 113* 86 124*    Wt Readings from Last 3 Encounters:  05/28/13 59.9 kg (132 lb 0.9 oz)  05/27/13 61.75 kg (136 lb 2.1 oz)  05/18/13 58.968 kg (130 lb)    Physical Exam:  HENT: oral mucosa pink and moist, voice dysphonic Head: Normocephalic.  Eyes: EOM are normal.  Neck: Normal range of motion. Neck supple. No thyromegaly present.  Cardiovascular: Normal rate and regular rhythm.  Respiratory: Effort normal and breath sounds normal. No respiratory distress. Occasional cough, some upper airway sounds. i didn't here any rales or wheezes GI: Soft. Bowel sounds are normal. He exhibits decreased distension.    Patient was able to state his  name and age. He was a poor medical historian , place is [place]  Skin: still dipahoretic Back incision is clean with granulation tissue noted, lumbar incision approximately 1 cm deep with some drainage, no odor.  dressing in place  Neuro:  Motor strength remains 4/5 bilateral deltoid, bicep, tricep, grip, hip flexor, knee extensors, ankle dorsiflexor plantar flexion  Sensory intact to light touch in bilateral upper and lower limb Psych: pleasant and appropriate  Assessment/Plan: 1. Functional deficits secondary to Lumbar stenosis s/p L1-S1 fusion which require 3+ hours per day of interdisciplinary therapy in a comprehensive inpatient rehab setting. Physiatrist is providing close team supervision and 24 hour management of active medical problems listed below. Physiatrist and rehab team continue to assess barriers to discharge/monitor patient progress toward functional and medical goals. FIM: FIM - Bathing Bathing Steps Patient Completed: Front perineal area;Right upper leg;Left upper leg Bathing: 2: Max-Patient completes 3-4 80f10 parts or 25-49%  FIM - Upper Body Dressing/Undressing Upper body dressing/undressing steps patient completed: Thread/unthread right sleeve of pullover shirt/dresss;Thread/unthread left sleeve of pullover shirt/dress;Pull shirt over trunk Upper body dressing/undressing: 4: Min-Patient completed 75 plus % of tasks FIM - Lower Body Dressing/Undressing Lower body dressing/undressing steps patient completed: Thread/unthread right pants leg;Thread/unthread left pants leg;Pull pants up/down Lower body dressing/undressing: 1: Total-Patient completed less than 25% of tasks  FIM - Toileting Toileting steps completed by patient: Adjust clothing prior to toileting;Performs perineal hygiene;Adjust clothing after toileting Toileting Assistive Devices: Grab bar or rail for support Toileting: 1: Total-Patient completed zero steps, helper did all 3  FIM - Glass blower/designer Devices: Grab bars Toilet Transfers: 3-To toilet/BSC: Mod A (lift or lower assist);3-From toilet/BSC: Mod A (lift or lower assist)  FIM - Control and instrumentation engineer Devices: Walker;Bed rails Bed/Chair Transfer: 5: Supine > Sit: Supervision (verbal cues/safety issues);4: Sit > Supine: Min A (steadying pt. > 75%/lift 1 leg)  FIM - Locomotion: Wheelchair Locomotion: Wheelchair: 0: Activity did not occur FIM - Locomotion: Ambulation Locomotion: Ambulation Assistive Devices: Administrator Ambulation/Gait Assistance: 4: Min assist Locomotion: Ambulation: 2: Travels 50 - 149 ft with minimal assistance (Pt.>75%) (60)  Comprehension Comprehension Mode: Auditory Comprehension: 6-Follows complex conversation/direction: With extra time/assistive device  Expression Expression Mode: Verbal Expression: 6-Expresses complex ideas: With extra time/assistive device  Social Interaction Social Interaction: 6-Interacts appropriately with others with medication or extra time (anti-anxiety, antidepressant).  Problem Solving Problem Solving: 5-Solves basic 90% of the time/requires cueing < 10% of the time  Memory Memory: 6-Assistive device: No helper Medical Problem List and Plan:  1. 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 Patient with history of multiple back surgeries  2. DVT Prophylaxis/Anticoagulation: Subcutaneous Lovenox. Monitor platelet counts and any signs of bleeding  3. Pain Management: Neurontin 600 mg twice a day, OxyContin sustained-release 10 mg every 12 hours, Dilaudid 4 mg every 8 hours moderate to severe pain   -controlled at present 4. Mood/depression. Prozac 40 mg twice a day, Xanax 0.5 mg 3 times a day as needed. Provide emotional support and  5. Neuropsych: This patient is not capable of making decisions on his own behalf.  6. Wound. Followup wound care nurse with dressings to  back wound as directed  7. Hoarseness of voice after recent back surgery. Followup ENT Dr. Thera Flake.   normal scope/laryngoscopic exam. Continued followup speech therapy. Voice quality improving slowly 8. Rheumatoid arthritis. Chronic prednisone therapy. hold Humira for now. On 40 mg subcutaneous every 14 days as prior to admission  9. Hypertension. Cozaar 50 mg daily, Lopressor 25 mg twice a day. Dc hctz, clonidine prn.  10. Hyperlipidemia. Zocor.  11. COPD. Spiriva daily, albuterol nebulizer every 2 hours as needed. Check oxygen saturations every shift  12. Nausea--  -KUB improved, bm's yesterday.   -treat symptomatically 13. Low grade temp/ID, early sepsis?  -wbc 17k  -low grade temp, pt not feeling well  -full work up including MRI of lumbar spine, blood, wound cultures, urine culture  -iv vanc 791m q8  -CXR appears relatively unremarkable.  -consider surgical and ID consults pending results.  -IVF 14. Hyponatremia: has a history but not that severe  -dc hctz  -repeat lab pending  -NS IVF, FR      LOS (Days) 3 A FACE TO FACE EVALUATION WAS PERFORMED  Teah Votaw T 05/30/2013 8:35 AM

## 2013-05-30 NOTE — Significant Event (Signed)
CRITICAL VALUE ALERT  Critical value received:  NA+ 117  Date of notification:  05/30/13  Time of notification:  0747  Critical value read back:yes  Nurse who received alert:  Dorien Chihuahua  MD notified (1st page):  Dr Naaman Plummer  Time of first page:  [phone]  MD notified (2nd page):  Time of second page:  Responding MD:  Dr. Naaman Plummer  Time MD responded:  [phone] order repeat lab

## 2013-05-30 NOTE — Significant Event (Signed)
CRITICAL VALUE ALERT  Critical value received:  NA+ 117 Calcium 7.7  Date of notification:  05/30/13  Time of notification:  0814  Critical value read back:yes  Nurse who received alert:  Dorien Chihuahua RN [phone]  MD notified (1st page): Dr. Naaman Plummer  Time of first page:  1822  MD notified (2nd page):  Time of second page:  Responding MD:  Dr. Naaman Plummer  Time MD responded:  [phone]

## 2013-05-30 NOTE — Significant Event (Signed)
CRITICAL VALUE ALERT  Critical value received:  Na+ 117  Date of notification:  05/30/13  Time of notification:  8307  Critical value read back:yes  Nurse who received alert:  Dorien Chihuahua RN  MD notified (1st page):  Dr. Angelique Holm  Time of first page:  1132  MD notified (2nd page):  Time of second page:  Responding MD:  Dr. Naaman Plummer  Time MD responded:  1132; new orders to increase IVF rate

## 2013-05-31 ENCOUNTER — Inpatient Hospital Stay (HOSPITAL_COMMUNITY): Payer: Medicare Other

## 2013-05-31 ENCOUNTER — Inpatient Hospital Stay (HOSPITAL_COMMUNITY): Payer: Medicare Other | Admitting: Speech Pathology

## 2013-05-31 ENCOUNTER — Encounter (HOSPITAL_COMMUNITY): Payer: Medicare Other | Admitting: Occupational Therapy

## 2013-05-31 ENCOUNTER — Ambulatory Visit: Payer: Medicare Other | Admitting: Physical Therapy

## 2013-05-31 ENCOUNTER — Ambulatory Visit: Payer: Medicare Other

## 2013-05-31 DIAGNOSIS — R5381 Other malaise: Secondary | ICD-10-CM

## 2013-05-31 LAB — CBC
HCT: 33.6 % — ABNORMAL LOW (ref 39.0–52.0)
Hemoglobin: 11.7 g/dL — ABNORMAL LOW (ref 13.0–17.0)
MCH: 27.9 pg (ref 26.0–34.0)
MCHC: 34.8 g/dL (ref 30.0–36.0)
MCV: 80.2 fL (ref 78.0–100.0)
Platelets: 337 10*3/uL (ref 150–400)
RBC: 4.19 MIL/uL — ABNORMAL LOW (ref 4.22–5.81)
RDW: 14.5 % (ref 11.5–15.5)
WBC: 9.2 10*3/uL (ref 4.0–10.5)

## 2013-05-31 LAB — URINE CULTURE
Colony Count: NO GROWTH
Culture: NO GROWTH

## 2013-05-31 LAB — BASIC METABOLIC PANEL
BUN: 8 mg/dL (ref 6–23)
CO2: 26 mEq/L (ref 19–32)
Calcium: 7.8 mg/dL — ABNORMAL LOW (ref 8.4–10.5)
Chloride: 85 mEq/L — ABNORMAL LOW (ref 96–112)
Creatinine, Ser: 0.51 mg/dL (ref 0.50–1.35)
GFR calc Af Amer: >90 mL/min (ref >90)
GFR calc non Af Amer: >90 mL/min (ref >90)
Glucose, Bld: 92 mg/dL (ref 70–99)
Potassium: 3.6 mEq/L — ABNORMAL LOW (ref 3.7–5.3)
Sodium: 123 mEq/L — ABNORMAL LOW (ref 137–147)

## 2013-05-31 LAB — EXPECTORATED SPUTUM ASSESSMENT W GRAM STAIN, RFLX TO RESP C

## 2013-05-31 LAB — VANCOMYCIN, TROUGH: Vancomycin Tr: 18.6 ug/mL (ref 10.0–20.0)

## 2013-05-31 LAB — OSMOLALITY: Osmolality: 240 mOsm/kg — ABNORMAL LOW (ref 275–300)

## 2013-05-31 MED ORDER — LEVOFLOXACIN 500 MG PO TABS
500.0000 mg | ORAL_TABLET | Freq: Every day | ORAL | Status: DC
  Administered 2013-05-31 – 2013-06-08 (×9): 500 mg via ORAL
  Filled 2013-05-31 (×10): qty 1

## 2013-05-31 NOTE — Progress Notes (Signed)
Subjective/Complaints: Feels better today but still has little appetite. Persistent cough, sometimes productive A 12 point review of systems has been performed and if not noted above is otherwise negative.   Objective: Vital Signs: Blood pressure 146/88, pulse 69, temperature 99.8 F (37.7 C), temperature source Oral, resp. rate 18, weight 55.5 kg (122 lb 5.7 oz), SpO2 92.00%. Dg Chest 2 View  05/30/2013   CLINICAL DATA:  Fever.  Cough.  EXAM: CHEST  2 VIEW  COMPARISON:  05/26/2013  FINDINGS: Low lung volumes again seen with chronic elevation of right hemidiaphragm. Mild scarring in left lung base is stable. No evidence of acute infiltrate or edema. No evidence of pleural effusion. Heart size remains stable. Bilateral shoulder prostheses again seen as well as severe thoracic spine degenerative changes.  IMPRESSION: Stable low lung volumes and left basilar scarring. No acute findings.   Electronically Signed   By: Earle Gell M.D.   On: 05/30/2013 10:42   Dg Abd 1 View  05/29/2013   CLINICAL DATA:  Abdominal distention and pain. Nausea. Constipation.  EXAM: ABDOMEN - 1 VIEW  COMPARISON:  05/20/2013  FINDINGS: No evidence of dilated bowel loops. Decreased colonic stool seen since previous study. Lumbar spine fusion hardware again noted.  IMPRESSION: Decreased stool burden since previous study.  No acute findings.   Electronically Signed   By: Earle Gell M.D.   On: 05/29/2013 19:11   Mr Lumbar Spine W Wo Contrast  05/31/2013   CLINICAL DATA:  Fever, evaluate for postoperative infection.  EXAM: MRI LUMBAR SPINE WITHOUT AND WITH CONTRAST  TECHNIQUE: Multiplanar and multiecho pulse sequences of the lumbar spine were obtained without and with intravenous contrast.  CONTRAST:  90m MULTIHANCE GADOBENATE DIMEGLUMINE 529 MG/ML IV SOLN  COMPARISON:  DG ABD 1 VIEW dated 05/29/2013; DG LUMBAR SPINE 2-3V dated 05/20/2013  FINDINGS: The patient is status post L1 through S1 bilateral pedicle screw and  rod fixation with bridging rod at L4. L4-5 interbody disc fusion material. Grade 1 L4-5 anterolisthesis, with severe degenerative disc disease of the non surgically altered discs. Moderate acute on chronic discogenic endplate changes at LZ3-G6 with more chronic appearing moderate to severe chronic discogenic endplate changes LY4-0 moderate L2-3 and L4-5. No suspicious osseous or intradiscal enhancement.  Bright T2, low T1 nonenhancing paraspinal 3 x 3.8 x 10.6 cm (AP by transverse by cc) fluid collection along the surgical approach. Paraspinal muscle denervation without suspicious enhancement of the paraspinal muscles.  Conus medullaris terminates at L1 and appears normal in morphology and signal characteristics. Cauda equina is unremarkable, specifically no nerve root clumping. No abnormal cord, leptomeningeal nor epidural enhancement. Partially imaged colonic diverticulosis. Right renal parapelvic cyst.  Level by level evaluation (please though, evaluation of the neural foramen limited by susceptibility hardware artifact):  T12-L1: No disc bulge, canal stenosis nor neural foraminal narrowing. Please note, the left L1 pedicle screw appears to traverse the left lateral recess, axial 6/36.  L1-2: Posterior decompression, no canal stenosis or definite neural foraminal narrowing.  L2-3: Small broad-based disc osteophyte complex, status post posterior decompression without canal stenosis. No definite neural foraminal narrowing.  L3-4: Small broad-based disc osteophyte complex, status post posterior decompression without canal stenosis. No definite neural foraminal narrowing.  L4-5: Anterolisthesis. Posterior decompression without canal stenosis. Suspected mild right neural foraminal narrowing.  L5-S1: Transitional anatomy, sacralized L5 vertebral body. Posterior decompression without canal stenosis, at this level may be acute. Left central 6 mm contiguous disc extrusion,  status post possible discectomy, there is  posterior talus placement of the traversing left S1 nerve with enhancing T2 bright presumed early granulation tissue within the left lateral recess. Enhancing edematous left S1 nerve.  IMPRESSION: No MR findings of infection within the lumbar spine.  Status post L1 through L5 laminectomies with posterior instrumentation. Paraspinal fluid collection along the surgical approach favors seroma, and is unlikely to reflect pseudomeningocele.  Small left central L5-S1 disc extrusion, status post suspected ipsilateral discectomy, with enhancing granulation tissue within the lateral recess, and left S1 neuritis.   Electronically Signed   By: Elon Alas   On: 05/31/2013 01:54    Recent Labs  05/30/13 0700 05/31/13 0620  WBC 16.1* 9.2  HGB 13.3 11.7*  HCT 37.0* 33.6*  PLT 390 337    Recent Labs  05/30/13 1600 05/31/13 0620  NA 117* 123*  K 3.6* 3.6*  CL 80* 85*  GLUCOSE 124* 92  BUN 8 8  CREATININE 0.47* 0.51  CALCIUM 7.7* 7.8*   CBG (last 3)  No results found for this basename: GLUCAP,  in the last 72 hours  Wt Readings from Last 3 Encounters:  05/31/13 55.5 kg (122 lb 5.7 oz)  05/27/13 61.75 kg (136 lb 2.1 oz)  05/18/13 58.968 kg (130 lb)    Physical Exam:  HENT: oral mucosa pink and moist, voice dysphonic Head: Normocephalic.  Eyes: EOM are normal.  Neck: Normal range of motion. Neck supple. No thyromegaly present.  Cardiovascular: Normal rate and regular rhythm.  Respiratory: Effort normal and breath sounds normal. No respiratory distress. Cough with upper airway sounds. No rales. GI: Soft. Bowel sounds are normal. He exhibits no distension. Soft. Non tender.   Patient was able to state his name and age. He was a poor medical historian , place is [place]  Skin: still dipahoretic Back incision is clean with granulation tissue noted, lumbar incision approximately 1 cm deep with some drainage, no odor.  dressing in place  Neuro:  Motor strength remains 4/5 bilateral  deltoid, bicep, tricep, grip, hip flexor, knee extensors, ankle dorsiflexor plantar flexion  Sensory intact to light touch in bilateral upper and lower limb Psych: pleasant and appropriate  Assessment/Plan: 1. Functional deficits secondary to Lumbar stenosis s/p L1-S1 fusion which require 3+ hours per day of interdisciplinary therapy in a comprehensive inpatient rehab setting. Physiatrist is providing close team supervision and 24 hour management of active medical problems listed below. Physiatrist and rehab team continue to assess barriers to discharge/monitor patient progress toward functional and medical goals. FIM: FIM - Bathing Bathing Steps Patient Completed: Front perineal area;Right upper leg;Left upper leg Bathing: 2: Max-Patient completes 3-4 22f10 parts or 25-49%  FIM - Upper Body Dressing/Undressing Upper body dressing/undressing steps patient completed: Thread/unthread right sleeve of pullover shirt/dresss;Thread/unthread left sleeve of pullover shirt/dress;Pull shirt over trunk Upper body dressing/undressing: 4: Min-Patient completed 75 plus % of tasks FIM - Lower Body Dressing/Undressing Lower body dressing/undressing steps patient completed: Thread/unthread right pants leg;Thread/unthread left pants leg;Pull pants up/down Lower body dressing/undressing: 1: Total-Patient completed less than 25% of tasks  FIM - Toileting Toileting steps completed by patient: Adjust clothing prior to toileting;Performs perineal hygiene;Adjust clothing after toileting Toileting Assistive Devices: Grab bar or rail for support Toileting: 1: Total-Patient completed zero steps, helper did all 3  FIM - TRadio producerDevices: Grab bars Toilet Transfers: 3-To toilet/BSC: Mod A (lift or lower assist);3-From toilet/BSC: Mod A (lift or lower assist)  FIM - Bed/Chair  Financial planner Devices: Environmental consultant;Bed rails Bed/Chair Transfer: 5: Supine > Sit:  Supervision (verbal cues/safety issues);4: Sit > Supine: Min A (steadying pt. > 75%/lift 1 leg)  FIM - Locomotion: Wheelchair Locomotion: Wheelchair: 0: Activity did not occur FIM - Locomotion: Ambulation Locomotion: Ambulation Assistive Devices: Administrator Ambulation/Gait Assistance: 4: Min assist Locomotion: Ambulation: 2: Travels 50 - 149 ft with minimal assistance (Pt.>75%) (60)  Comprehension Comprehension Mode: Auditory Comprehension: 6-Follows complex conversation/direction: With extra time/assistive device  Expression Expression Mode: Verbal Expression: 6-Expresses complex ideas: With extra time/assistive device  Social Interaction Social Interaction: 6-Interacts appropriately with others with medication or extra time (anti-anxiety, antidepressant).  Problem Solving Problem Solving: 5-Solves basic 90% of the time/requires cueing < 10% of the time  Memory Memory: 6-Assistive device: No helper Medical Problem List and Plan:  1. 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 Patient with history of multiple back surgeries  2. DVT Prophylaxis/Anticoagulation: Subcutaneous Lovenox. Monitor platelet counts and any signs of bleeding  3. Pain Management: Neurontin 600 mg twice a day, OxyContin sustained-release 10 mg every 12 hours, Dilaudid 4 mg every 8 hours moderate to severe pain   -controlled at present 4. Mood/depression. Prozac 40 mg twice a day, Xanax 0.5 mg 3 times a day as needed. Provide emotional support and  5. Neuropsych: This patient is not capable of making decisions on his own behalf.  6. Wound. Followup wound care nurse with dressings to back wound as directed  7. Hoarseness of voice after recent back surgery. Followup ENT Dr. Thera Flake.   normal scope/laryngoscopic exam. Continued followup speech therapy. Voice quality improving slowly 8. Rheumatoid arthritis. Chronic prednisone therapy. hold Humira for now. On 40 mg  subcutaneous every 14 days as prior to admission  9. Hypertension. Cozaar 50 mg daily, Lopressor 25 mg twice a day. Dc hctz, clonidine prn.  10. Hyperlipidemia. Zocor.  11. COPD. Spiriva daily, albuterol nebulizer every 2 hours as needed. Check oxygen saturations every shift  12. Nausea--  -feels better but app still poor 13. Low grade temp/ID, early sepsis?  -wbc 16k--down to 9 today  -temp improved but still has cough.---mucinex  -MRI with what appears to be large seroma---contact NS today, on chronic steroids  -iv vanc 767m q8--continue for now  -CXR appears relatively unremarkable.  -consider surgical and ID consults pending results.  -IVF 14. Hyponatremia: has a history but not that severe  -off hctz, urine sodium increased, serum osmolality very low  -salt tabs, declomycin  -NS IVF, FR      LOS (Days) 4 A FACE TO FACE EVALUATION WAS PERFORMED  SWARTZ,ZACHARY T 05/31/2013 7:25 AM

## 2013-05-31 NOTE — Progress Notes (Signed)
Physical Therapy Session Note  Patient Details  Name: [name] MRN: [id number] Date of Birth: 09-02-1945  Today's Date: 05/31/2013 Time: 8338-2505 Time Calculation (min): 47 min  Short Term Goals: Week 1:  PT Short Term Goal 1 (Week 1): =LTG  Skilled Therapeutic Interventions/Progress Updates:    Session consisted of pt performing bed mobility with Mod A (pt was very slow in movement initiation when performing supine to sit and needed verbal cueing in order to appropriately perform log rolling technique), gait from the pt's room to the gym with Min A and the use of a RW(pt required verbal cueing in order to perform gait with proper heel strike), stand pivot transfers with Min A and the use of a RW (verbal cueing needed for proper foot placement prior to performing the transfer), and performing a balance activity on the Wii Fit balance board (working on shifting weight in various directions) in order to improve dynamic standing balance which required Min A and the use of a RW (verbal cueing also needed for pt to shift his weight in the appropriate direction). Pt movement speed was very slow throughout the session , he appeared disoriented at times, and he had trouble following directions during the session which was not consistent with how pt presented during the morning session. The RN was notified of this at the end of the session.   Therapy Documentation Precautions:  Precautions Precautions: Back Type of Shoulder Precautions: Pt able to state 3/3 precautions with min verbal cueing Precaution Comments: pt verbalized understanding of 3/3 back precautions Restrictions Weight Bearing Restrictions: No Pain:  Pt had no c/o pain during the session.   See FIM for current functional status  Therapy/Group: Individual Therapy  [name] 05/31/2013, 2:24 PM

## 2013-05-31 NOTE — Progress Notes (Signed)
Occupational Therapy Session Note  Patient Details  Name: [name] MRN: [id number] Date of Birth: November 25, 1945  Today's Date: 05/31/2013 Time: 5072-2575 Time Calculation (min): 55 min  Short Term Goals: Week 1:  OT Short Term Goal 1 (Week 1): Short Term Goals = Long Term Goals  Skilled Therapeutic Interventions/Progress Updates:  Upon entering room, patient found supine in bed. Patient with complaints of nausea and 6/10 pain in back; RN made aware. Therapist encouraged patient to participate in therapy this am, and patient reluctantly willing. Patient engaged in bed mobility, taking more than a reasonable amount of time. Patient sat edge of bed and required steady assist initially secondary to dizziness and LOB. BP=156/96; RN made aware of high blood pressure. Patient sat edge of bed for self-feeding. Patient stated that this was the first thing he's put in his mouth in about 4 days. After only a few bites, patient couldn't eat anymore. From here, therapist assisted patient with LB dressing; total assist. Patient stood to pull pants up to waist and then transferred > w/c. Patient with posterior lean and LOB during standing. From here, RN present and patient with complaints of nausea. Patient sat at sink for grooming tasks of brushing teeth and washing face. At end of session, left patient seated in w/c beside bed with all needed items within reach. Encouraged patient to stay OOB for at least 30 minutes.   Precautions:  Precautions Precautions: Back Type of Shoulder Precautions: Pt able to state 3/3 precautions with min verbal cueing Precaution Comments: pt verbalized understanding of 3/3 back precautions Required Braces or Orthoses:  (patient wears spinal brace but no brace ordered) Spinal Brace: Lumbar corset;Applied in sitting position Restrictions Weight Bearing Restrictions: No  See FIM for current functional status  Therapy/Group: Individual Therapy  Benji Poynter 05/31/2013,  8:35 AM

## 2013-05-31 NOTE — Progress Notes (Signed)
Reviewed and in agreement with treatment provided.  

## 2013-05-31 NOTE — Progress Notes (Signed)
Speech Language Pathology Daily Session Note  Patient Details  Name: [name] MRN: [id number] Date of Birth: 01-30-1946  Today's Date: 05/31/2013 Time: 1430-1500 Time Calculation (min): 30 min  Short Term Goals: Week 1: SLP Short Term Goal 1 (Week 1): Patient will perform diaphragmatic breathing and respiratory support and control exercises with minimal cues. SLP Short Term Goal 2 (Week 1): Patient will perform vocal adduction and sustained phonation exercises with minimal cues. SLP Short Term Goal 3 (Week 1): SLP will determine benefit from NMES for use in voice treatment.  Skilled Therapeutic Interventions: Skilled treatment session focused on voice goals. Upon entering the room, the pt was supine in bed with RN in room. The patient reported he was now feeling well and the RN also reported that pt has been "wheezing" and would benefit from  a breathing treatment. SLP facilitated session by providing Min verbal cues for utilization of diaphragmatic breathing at the word level, however, after every deep breath, the pt had a large productive cough. Suction was hooked up in order to increase pt's ability to orally expectorate mucous.  Pt was aphonic throughout the session and demonstrated difficulty recalling history of voice impairment. RN and PA made aware. Continue with current plan of care.    FIM:  Comprehension Comprehension Mode: Auditory Comprehension: 6-Follows complex conversation/direction: With extra time/assistive device Expression Expression: 5-Expresses basic needs/ideas: With extra time/assistive device Social Interaction Social Interaction: 6-Interacts appropriately with others with medication or extra time (anti-anxiety, antidepressant). Problem Solving Problem Solving: 5-Solves basic 90% of the time/requires cueing < 10% of the time Memory Memory: 4-Recognizes or recalls 75 - 89% of the time/requires cueing 10 - 24% of the time  Pain Pt reported he felt  "crummy"   Therapy/Group: Individual Therapy  Senan Urey, [place] 05/31/2013, 3:16 PM

## 2013-05-31 NOTE — Progress Notes (Signed)
Physical Therapy Session Note  Patient Details  Name: [name] MRN: [id number] Date of Birth: Dec 06, 1945  Today's Date: 05/31/2013 Time: 1055-1205 Time Calculation (min): 70 min  Short Term Goals: Week 1:  PT Short Term Goal 1 (Week 1): =LTG  Skilled Therapeutic Interventions/Progress Updates:    Session began with pt performing step taps and step ups on 7' steps with Min A (mainly for steadying to prevent LOB)  and the use of bilateral handrails (pt needed verbal cueing throughout these activities in order to bear more weight through his LE's) in order to improve the pt's ability to climb stairs, pt performing an activity where he performed a sit to stand transfer then walked a few feet and performed a stand to sit transfer (requiring Min-Mod A and the use of a RW to perform this activity) while focusing on using his LE's as much as possible to perform these transfers in order to improve functional independence and LE strength bilaterally. The session ended with the pt performing cone weaving with Min A and the use of a RW (verbal cueing needed to perform activity with a proper heel strike gait pattern) to improve pt's safety at home, a stand pivot transfer with Min A and the use of a RW (verbal cueing also needed for proper hand placement on the RW when performing this transfer), and bed mobility with Min A. Pt c/o fatigue and nausea throughout the session and therefore multiple rest breaks needed to be taken during and between activities.  Therapy Documentation Precautions:  Precautions Precautions: Back Type of Shoulder Precautions: Pt able to state 3/3 precautions with min verbal cueing Precaution Comments: pt verbalized understanding of 3/3 back precautions Required Braces or Orthoses:  (patient wears spinal brace but no brace ordered) Spinal Brace: Lumbar corset;Applied in sitting position Restrictions Weight Bearing Restrictions: No Pain:  Pt c/o 5/10 back pain during the  session. RN was made aware of pt c/o pain and pt took multiple rest breaks during the session in order to alleviate this pain.   See FIM for current functional status  Therapy/Group: Individual Therapy  Aarionna Germer 05/31/2013, 8:04 AM

## 2013-05-31 NOTE — Progress Notes (Signed)
ANTIBIOTIC CONSULT NOTE - FOLLOW UP  Pharmacy Consult for Vancomycin Indication: rule out sepsis, rule out wound infection   Allergies  Allergen Reactions  . Morphine And Related Shortness Of Breath, Nausea And Vomiting, Swelling and Other (See Comments)    Agitation, tolerates dilaudid  . Penicillins Hives, Shortness Of Breath and Rash    REACTION: hives, breathing problems  . Cefixime     rash  . Percocet [Oxycodone-Acetaminophen] Anxiety    Takes OxyContin at home.    Patient Measurements: Weight: 122 lb 5.7 oz (55.5 kg) Adjusted Body Weight:   Vital Signs: Temp: 98.8 F (37.1 C) (02/23 1819) Temp src: Oral (02/23 1819) BP: 130/70 mmHg (02/23 2034) Pulse Rate: 68 (02/23 2034) Intake/Output from previous day: 02/22 0701 - 02/23 0700 In: 0  Out: 1500 [Urine:1500] Intake/Output from this shift: Total I/O In: -  Out: 700 [Urine:700]  Labs:  Recent Labs  05/30/13 0700 05/30/13 1015 05/30/13 1600 05/31/13 0620  WBC 16.1*  --   --  9.2  HGB 13.3  --   --  11.7*  PLT 390  --   --  337  CREATININE 0.54 0.54 0.47* 0.51   The CrCl is unknown because both a height and weight (above a minimum accepted value) are required for this calculation.  Recent Labs  05/31/13 2030  [place] 18.6     Microbiology: Recent Results (from the past 720 hour(s))  CULTURE, BLOOD (SINGLE)     Status: None   Collection Time    05/18/13  4:01 PM      Result Value Ref Range Status   Organism ID, Bacteria NO GROWTH 5 DAYS   Final  CULTURE, BLOOD (SINGLE)     Status: None   Collection Time    05/18/13  4:01 PM      Result Value Ref Range Status   Organism ID, Bacteria NO GROWTH 5 DAYS   Final  CULTURE, BLOOD (ROUTINE X 2)     Status: None   Collection Time    05/23/13  8:21 PM      Result Value Ref Range Status   Specimen Description BLOOD LEFT ARM   Final   Special Requests BOTTLES DRAWN AEROBIC AND ANAEROBIC 10CC EA   Final   Culture  Setup Time     Final   Value:  05/24/2013 01:43     Performed at [facility]   Culture     Final   Value: NO GROWTH 5 DAYS     Performed at [facility]   Report Status 05/30/2013 FINAL   Final  CULTURE, BLOOD (ROUTINE X 2)     Status: None   Collection Time    05/23/13  8:26 PM      Result Value Ref Range Status   Specimen Description BLOOD LEFT HAND   Final   Special Requests BOTTLES DRAWN AEROBIC ONLY 10CC   Final   Culture  Setup Time     Final   Value: 05/24/2013 01:43     Performed at [facility]   Culture     Final   Value: NO GROWTH 5 DAYS     Performed at [facility]   Report Status 05/30/2013 FINAL   Final  URINE CULTURE     Status: None   Collection Time    05/23/13  8:43 PM      Result Value Ref Range Status   Specimen Description URINE, RANDOM   Final  Special Requests NONE   Final   Culture  Setup Time     Final   Value: 05/23/2013 21:44     Performed at [place]     Final   Value: 25,000 COLONIES/ML     Performed at [facility]   Culture     Final   Value: Multiple bacterial morphotypes present, none predominant. Suggest appropriate recollection if clinically indicated.     Performed at [facility]   Report Status 05/24/2013 FINAL   Final  WOUND CULTURE     Status: None   Collection Time    05/30/13 10:00 AM      Result Value Ref Range Status   Specimen Description WOUND   Final   Special Requests BACK INCISION   Final   Gram Stain     Final   Value: RARE WBC PRESENT, PREDOMINANTLY PMN     RARE SQUAMOUS EPITHELIAL CELLS PRESENT     NO ORGANISMS SEEN     Performed at [facility]   Culture     Final   Value: NO GROWTH 1 DAY     Performed at [facility]   Report Status PENDING   Incomplete  CULTURE, BLOOD (ROUTINE X 2)     Status: None   Collection Time    05/30/13 10:15 AM      Result Value Ref Range Status   Specimen Description Blood   Final   Special Requests NONE   Final    Culture  Setup Time     Final   Value: 05/30/2013 18:49     Performed at [facility]   Culture     Final   Value:        BLOOD CULTURE RECEIVED NO GROWTH TO DATE CULTURE WILL BE HELD FOR 5 DAYS BEFORE ISSUING A FINAL NEGATIVE REPORT     Performed at [facility]   Report Status PENDING   Incomplete  CULTURE, BLOOD (ROUTINE X 2)     Status: None   Collection Time    05/30/13 10:30 AM      Result Value Ref Range Status   Specimen Description Blood   Final   Special Requests NONE   Final   Culture  Setup Time     Final   Value: 05/30/2013 18:49     Performed at [facility]   Culture     Final   Value:        BLOOD CULTURE RECEIVED NO GROWTH TO DATE CULTURE WILL BE HELD FOR 5 DAYS BEFORE ISSUING A FINAL NEGATIVE REPORT     Performed at [facility]   Report Status PENDING   Incomplete  URINE CULTURE     Status: None   Collection Time    05/30/13 12:00 PM      Result Value Ref Range Status   Specimen Description URINE, CATHETERIZED   Final   Special Requests NONE   Final   Culture  Setup Time     Final   Value: 05/30/2013 18:57     Performed at [place]     Final   Value: NO GROWTH     Performed at [facility]   Culture     Final   Value: NO GROWTH     Performed at [facility]   Report Status 05/31/2013 FINAL   Final  CULTURE, EXPECTORATED  SPUTUM-ASSESSMENT     Status: None   Collection Time    05/31/13  3:55 PM      Result Value Ref Range Status   Specimen Description SPUTUM   Final   Special Requests Immunocompromised   Final   Sputum evaluation     Final   Value: THIS SPECIMEN IS ACCEPTABLE. RESPIRATORY CULTURE REPORT TO FOLLOW.   Report Status 05/31/2013 FINAL   Final    Anti-infectives   Start     Dose/Rate Route Frequency Ordered Stop   05/31/13 2200  levofloxacin (LEVAQUIN) tablet 500 mg     500 mg Oral Daily at 10 pm 05/31/13 2037     05/30/13 2100  demeclocycline (DECLOMYCIN) tablet  300 mg     300 mg Oral Every 12 hours 05/30/13 1821     05/30/13 0900  vancomycin (VANCOCIN) IVPB 750 mg/150 ml premix     750 mg 150 mL/hr over 60 Minutes Intravenous 3 times per day 05/30/13 0807        Assessment: 68yom on Vancomycin Day 2 for r/o sepsis/wound infection. Tmax 100.8, WBC down to wnl, no growth reported from cultures. Vancomycin trough was drawn this evening (due to q8h regimen) was therapeutic at 18.6 mcg/ml - will continue current regimen and follow-up clinical course. - SCr stable  Goal of Therapy:  Vancomycin trough level 15-20 mcg/ml  Plan:  1. Continue Vancomycin 722m IV q8h 2. Monitor renal function, cultures, clinical course    DEarleen [id number]/23/2015,9:52 PM

## 2013-05-31 NOTE — Progress Notes (Signed)
ANTIBIOTIC CONSULT NOTE - INITIAL  Pharmacy Consult for Vancomycin Indication: rule out sepsis, rule out wound infection  Allergies  Allergen Reactions  . Morphine And Related Shortness Of Breath, Nausea And Vomiting, Swelling and Other (See Comments)    Agitation, tolerates dilaudid  . Penicillins Hives, Shortness Of Breath and Rash    REACTION: hives, breathing problems  . Cefixime     rash  . Percocet [Oxycodone-Acetaminophen] Anxiety    Takes OxyContin at home.    Patient Measurements: Weight: 122 lb 5.7 oz (55.5 kg) Adjusted Body Weight: 55  Vital Signs: Temp: 97.6 F (36.4 C) (02/23 0813) Temp src: Oral (02/23 2831) BP: 150/81 mmHg (02/23 0813) Pulse Rate: 83 (02/23 0813) Intake/Output from previous day: 02/22 0701 - 02/23 0700 In: 0  Out: 1500 [Urine:1500] Intake/Output from this shift: Total I/O In: 120 [P.O.:120] Out: -   Labs:  Recent Labs  05/30/13 0700 05/30/13 1015 05/30/13 1600 05/31/13 0620  WBC 16.1*  --   --  9.2  HGB 13.3  --   --  11.7*  PLT 390  --   --  337  CREATININE 0.54 0.54 0.47* 0.51   The CrCl is unknown because both a height and weight (above a minimum accepted value) are required for this calculation. No results found for this basename: VANCOTROUGH, Corlis Leak, VANCORANDOM, GENTTROUGH, GENTPEAK, GENTRANDOM, TOBRATROUGH, TOBRAPEAK, TOBRARND, AMIKACINPEAK, AMIKACINTROU, AMIKACIN,  in the last 72 hours   Microbiology: Recent Results (from the past 720 hour(s))  CULTURE, BLOOD (SINGLE)     Status: None   Collection Time    05/18/13  4:01 PM      Result Value Ref Range Status   Organism ID, Bacteria NO GROWTH 5 DAYS   Final  CULTURE, BLOOD (SINGLE)     Status: None   Collection Time    05/18/13  4:01 PM      Result Value Ref Range Status   Organism ID, Bacteria NO GROWTH 5 DAYS   Final  CULTURE, BLOOD (ROUTINE X 2)     Status: None   Collection Time    05/23/13  8:21 PM      Result Value Ref Range Status   Specimen  Description BLOOD LEFT ARM   Final   Special Requests BOTTLES DRAWN AEROBIC AND ANAEROBIC 10CC EA   Final   Culture  Setup Time     Final   Value: 05/24/2013 01:43     Performed at [facility]   Culture     Final   Value: NO GROWTH 5 DAYS     Performed at [facility]   Report Status 05/30/2013 FINAL   Final  CULTURE, BLOOD (ROUTINE X 2)     Status: None   Collection Time    05/23/13  8:26 PM      Result Value Ref Range Status   Specimen Description BLOOD LEFT HAND   Final   Special Requests BOTTLES DRAWN AEROBIC ONLY 10CC   Final   Culture  Setup Time     Final   Value: 05/24/2013 01:43     Performed at [facility]   Culture     Final   Value: NO GROWTH 5 DAYS     Performed at [facility]   Report Status 05/30/2013 FINAL   Final  URINE CULTURE     Status: None   Collection Time    05/23/13  8:43 PM      Result Value Ref Range Status  Specimen Description URINE, RANDOM   Final   Special Requests NONE   Final   Culture  Setup Time     Final   Value: 05/23/2013 21:44     Performed at [place]     Final   Value: 25,000 COLONIES/ML     Performed at [facility]   Culture     Final   Value: Multiple bacterial morphotypes present, none predominant. Suggest appropriate recollection if clinically indicated.     Performed at [facility]   Report Status 05/24/2013 FINAL   Final  WOUND CULTURE     Status: None   Collection Time    05/30/13 10:00 AM      Result Value Ref Range Status   Specimen Description WOUND   Final   Special Requests BACK INCISION   Final   Gram Stain     Final   Value: RARE WBC PRESENT, PREDOMINANTLY PMN     RARE SQUAMOUS EPITHELIAL CELLS PRESENT     NO ORGANISMS SEEN     Performed at [facility]   Culture     Final   Value: NO GROWTH 1 DAY     Performed at [facility]   Report Status PENDING   Incomplete  CULTURE, BLOOD (ROUTINE X 2)     Status: None    Collection Time    05/30/13 10:15 AM      Result Value Ref Range Status   Specimen Description Blood   Final   Special Requests NONE   Final   Culture  Setup Time     Final   Value: 05/30/2013 18:49     Performed at [facility]   Culture     Final   Value:        BLOOD CULTURE RECEIVED NO GROWTH TO DATE CULTURE WILL BE HELD FOR 5 DAYS BEFORE ISSUING A FINAL NEGATIVE REPORT     Performed at [facility]   Report Status PENDING   Incomplete  CULTURE, BLOOD (ROUTINE X 2)     Status: None   Collection Time    05/30/13 10:30 AM      Result Value Ref Range Status   Specimen Description Blood   Final   Special Requests NONE   Final   Culture  Setup Time     Final   Value: 05/30/2013 18:49     Performed at [facility]   Culture     Final   Value:        BLOOD CULTURE RECEIVED NO GROWTH TO DATE CULTURE WILL BE HELD FOR 5 DAYS BEFORE ISSUING A FINAL NEGATIVE REPORT     Performed at [facility]   Report Status PENDING   Incomplete    Medical History: Past Medical History  Diagnosis Date  . Hx of colonic polyps   . Diverticulosis   . Hypertension   . 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 CAD (coronary artery disease)     minimal coronary plaque in the LAD and right coronary system. 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 7-06. Last nuclear stress 11-17-06 fixed anterior/ inferior defect, no inducible ischemia, EF 81%  . Allergic rhinitis   . Hx of colonoscopy   . PONV (postoperative nausea and vomiting)   . Dysrhythmia 01-24-12  past hx. A.Fib x1 episode-responded to med.  . Depression   . Anxiety     pt. feels its related to the medicine that he takes   . GERD (gastroesophageal reflux disease)   . COPD (chronic obstructive pulmonary disease)     pt denies this hx on 05/24/2013  . Osteoarthritis   . RA (rheumatoid arthritis)   .  Chronic back pain     Medications:  Scheduled:  . aspirin EC  81 mg Oral q morning - 10a  . cholecalciferol  1,000 Units Oral Daily  . demeclocycline  300 mg Oral Q12H  . docusate sodium  100 mg Oral BID  . enoxaparin (LOVENOX) injection  40 mg Subcutaneous Q24H  . feeding supplement (ENSURE COMPLETE)  237 mL Oral TID WC  . FLUoxetine  40 mg Oral BID  . fluticasone  2 spray Each Nare QHS  . gabapentin  600 mg Oral BID  . guaiFENesin  1,200 mg Oral BID  . losartan  50 mg Oral Daily  . metoprolol tartrate  25 mg Oral BID  . OxyCODONE  10 mg Oral Q12H  . pantoprazole  40 mg Oral BID  . predniSONE  5 mg Oral QPM  . simvastatin  40 mg Oral Q lunch  . sodium chloride  1 g Oral BID WC  . tiotropium  18 mcg Inhalation Daily  . vancomycin  750 mg Intravenous 3 times per day   Assessment: 68yo male with R/O sepsis, R/O wound infection.  Pt is on Vancomycin 731m IV q8, pharmacy is now to manage dosing.  His 4th dose was this AM, but doses 1 & 2 were only 5hr apart which may skew level.  Cr has been stable, though probably not a good indicator of renal function given deconditioning.  UOP 1.1 ml/kg/hr, WBC 9.2- improved, and AFeb.  Will check trough this PM.  Goal of Therapy:  Vancomycin trough level 15-20 mcg/ml  Plan:  1-  Vancomycin trough at 9PM 2-  Hold dose until trough evaluated  KGracy Bruins PharmD Clinical [facility]ist [facility]

## 2013-05-31 NOTE — Progress Notes (Signed)
CRITICAL VALUE ALERT  Critical value received:  Serum osmolality 240  Date of notification:  05/31/2013  Time of notification:  0123  Critical value read back: yes   Nurse who received alert:  Henreitta Leber, RN  MD notified (1st page):  Dr. Naaman Plummer  Time of first page:  0141  MD notified (2nd page): N/A  Time of second page: N/A  Responding MD:  Dr. Naaman Plummer  Time MD responded: [phone]

## 2013-05-31 NOTE — Plan of Care (Signed)
Problem: RH BLADDER ELIMINATION Goal: RH STG MANAGE BLADDER WITH ASSISTANCE STG Manage Bladder With min Assistance  Outcome: Not Progressing Requires total assist foley intact   Problem: RH SKIN INTEGRITY Goal: RH STG MAINTAIN SKIN INTEGRITY WITH ASSISTANCE STG Maintain Skin Integrity With min Assistance.  Outcome: Not Progressing Requires max assist

## 2013-05-31 NOTE — Progress Notes (Signed)
Inpatient [facility] Individual Statement of Services  Patient Name:  [name]  Date:  05/31/2013  Welcome to the [facility].  Our goal is to provide you with an individualized program based on your diagnosis and situation, designed to meet your specific needs.  With this comprehensive rehabilitation program, you will be expected to participate in at least 3 hours of rehabilitation therapies Monday-Friday, with modified therapy programming on the weekends.  Your rehabilitation program will include the following services:  Physical Therapy (PT), Occupational Therapy (OT), Speech Therapy (ST), 24 hour per day rehabilitation nursing, Therapeutic Recreaction (TR), Neuropsychology, Case Management (Social Worker), Rehabilitation Medicine, Nutrition Services and Pharmacy Services  Weekly team conferences will be held on Tuesdays to discuss your progress.  Your Social Worker will talk with you frequently to get your input and to update you on team discussions.  Team conferences with you and your family in attendance may also be held.  Expected length of stay: 10 days  Overall anticipated outcome: supervision  Depending on your progress and recovery, your program may change. Your Social Worker will coordinate services and will keep you informed of any changes. Your Social Worker's name and contact numbers are listed  below.  The following services may also be recommended but are not provided by the [facility] will be made to provide these services after discharge if needed.  Arrangements include referral to agencies that provide these services.  Your insurance has been verified to be:  [employer] Your primary doctor is:  Dr. Linda Hedges  Pertinent information will be shared with your doctor and your  insurance company.  Social Worker:  [place], [place] or [phone]   Information discussed with and copy given to patient by: Lennart Pall, 05/31/2013, 11:15 AM

## 2013-06-01 ENCOUNTER — Inpatient Hospital Stay (HOSPITAL_COMMUNITY): Payer: Medicare Other

## 2013-06-01 ENCOUNTER — Inpatient Hospital Stay (HOSPITAL_COMMUNITY): Payer: Medicare Other | Admitting: Speech Pathology

## 2013-06-01 ENCOUNTER — Inpatient Hospital Stay (HOSPITAL_COMMUNITY): Payer: Medicare Other | Admitting: Occupational Therapy

## 2013-06-01 ENCOUNTER — Encounter: Payer: Medicare Other | Admitting: Speech Pathology

## 2013-06-01 ENCOUNTER — Encounter (HOSPITAL_COMMUNITY): Payer: Medicare Other | Admitting: Occupational Therapy

## 2013-06-01 ENCOUNTER — Ambulatory Visit: Payer: Medicare Other | Admitting: Physical Therapy

## 2013-06-01 LAB — BASIC METABOLIC PANEL
BUN: 7 mg/dL (ref 6–23)
CO2: 25 mEq/L (ref 19–32)
Calcium: 7.9 mg/dL — ABNORMAL LOW (ref 8.4–10.5)
Chloride: 89 mEq/L — ABNORMAL LOW (ref 96–112)
Creatinine, Ser: 0.46 mg/dL — ABNORMAL LOW (ref 0.50–1.35)
GFR calc Af Amer: >90 mL/min (ref >90)
GFR calc non Af Amer: >90 mL/min (ref >90)
Glucose, Bld: 87 mg/dL (ref 70–99)
Potassium: 3.5 mEq/L — ABNORMAL LOW (ref 3.7–5.3)
Sodium: 126 mEq/L — ABNORMAL LOW (ref 137–147)

## 2013-06-01 NOTE — Progress Notes (Signed)
Physical Therapy Session Note  Patient Details  Name: [name] MRN: [id number] Date of Birth: [birth year]  Today's Date: 06/01/2013 Time: 8469-6295 Time Calculation (min): 54 min  Short Term Goals: Week 1:  PT Short Term Goal 1 (Week 1): =LTG  Skilled Therapeutic Interventions/Progress Updates:    Session consisted of pt performing gait from his room to the rehab gym and back with Min A and the use of a RW (improved heel strike noted today but pt still needed verbal cueing in this area) in order to improve endurance and gait mechanics, and performing standing balance activities and walking in place on the kinetron with Min A and bilateral UE support in order to improve dynamic balance and gait mechanics. Pt also performed gait with Min A and the use of a RW while also kicking a beanbag with both his R and L LE (verbal cueing needed to appropriately manage RW while performing this activity) in order for pt to work on LE coordination and single leg stance, and performing 2 x 2 steps (7" stairs) with Min A and the use of bilateral handrails (verbal cueing needed in order for pt to appropriately place feet on each step). Pt ended the session performing bed mobility with S and all transfers with S-Min guard (pt needed verbal cueing in order to sit down with appropriate LE eccentric control). Pt appeared more oriented and alert than he did during his afternoon session yesterday.   Therapy Documentation Precautions:  Precautions Precautions: Back Type of Shoulder Precautions: Pt able to state 3/3 precautions with min verbal cueing Precaution Comments: pt verbalized understanding of 3/3 back precautions Required Braces or Orthoses:  (patient wears spinal brace but no brace ordered) Spinal Brace: Lumbar corset;Applied in sitting position Restrictions Weight Bearing Restrictions: No Pain:  Pt c/o mild pain in his back during the session. Pt took rest breaks and was repositioned in the bed at  the end of the session in order to alleviate this pain.   See FIM for current functional status  Therapy/Group: Individual Therapy  Zedekiah Hinderman 06/01/2013, 12:48 PM

## 2013-06-01 NOTE — Progress Notes (Signed)
Reviewed and in agreement with treatment provided.  

## 2013-06-01 NOTE — Progress Notes (Signed)
NUTRITION FOLLOW UP  DOCUMENTATION CODES  Per approved criteria   -Severe malnutrition in the context of chronic illness    Intervention:   1. Supplements; Continue Ensure Complete PO TID, each supplement provides 350 kcal and 13 grams of protein  2.  Consider bowel regimen if appropriate, last documented bowel movement was 2/21.   NUTRITION DIAGNOSIS:  Malnutrition related to inadequate oral intake as evidenced by severe depletion of muscle mass and 19% weight loss in 3 months.   Monitor:  1. Food/Beverage; pt meeting >/=90% estimated needs with tolerance.  2. Wt/wt change; monitor trends  Assessment:   Patient is a 68 y.o. male with a Past Medical History of recent back surgery complicated by a wound infection, malnutrition, diarrhea, laryngitis, multiple hospitalizations over the past few months. Pt now admitted to rehab.  Pt continues with poor intake.   Wt variable since admission.  Wt on admission: 62.7 kg Current wt: 57.6 kg  RD consulted for nutrition assessment in pt with poor PO intake.  Pt unavailable at time of visit- in IR? RD will continue to follow.   Height: Ht Readings from Last 1 Encounters:  05/23/13 5' 6"  (1.676 m)    Weight Status:   Wt Readings from Last 1 Encounters:  06/01/13 126 lb 15.8 oz (57.6 kg)    Re-estimated needs:  Kcal: 1800-2000  Protein: 100-110 gm  Fluid: 1.8-2 L  Skin: intact  Diet Order: Cardiac   Intake/Output Summary (Last 24 hours) at 06/01/13 1610 Last data filed at 06/01/13 1200  Gross per 24 hour  Intake    240 ml  Output   2150 ml  Net  -1910 ml    Last BM: 2/21  Labs:   Recent Labs Lab 05/30/13 1600 05/31/13 0620 06/01/13 0450  NA 117* 123* 126*  K 3.6* 3.6* 3.5*  CL 80* 85* 89*  CO2 26 26 25   BUN 8 8 7   CREATININE 0.47* 0.51 0.46*  CALCIUM 7.7* 7.8* 7.9*  GLUCOSE 124* 92 87    CBG (last 3)  No results found for this basename: GLUCAP,  in the last 72 hours  Scheduled Meds: . aspirin EC  81  mg Oral q morning - 10a  . cholecalciferol  1,000 Units Oral Daily  . demeclocycline  300 mg Oral Q12H  . docusate sodium  100 mg Oral BID  . enoxaparin (LOVENOX) injection  40 mg Subcutaneous Q24H  . feeding supplement (ENSURE COMPLETE)  237 mL Oral TID WC  . FLUoxetine  40 mg Oral BID  . fluticasone  2 spray Each Nare QHS  . gabapentin  600 mg Oral BID  . guaiFENesin  1,200 mg Oral BID  . levofloxacin  500 mg Oral Q2200  . losartan  50 mg Oral Daily  . metoprolol tartrate  25 mg Oral BID  . OxyCODONE  10 mg Oral Q12H  . pantoprazole  40 mg Oral BID  . predniSONE  5 mg Oral QPM  . simvastatin  40 mg Oral Q lunch  . sodium chloride  1 g Oral BID WC  . tiotropium  18 mcg Inhalation Daily  . vancomycin  750 mg Intravenous 3 times per day    Continuous Infusions:   Brynda Greathouse, MS RD LDN Clinical Inpatient Dietitian Pager: [phone] Weekend/After hours pager: [phone]

## 2013-06-01 NOTE — Patient Care Conference (Signed)
Inpatient RehabilitationTeam Conference and Plan of Care Update Date: 06/01/2013   Time: 2:05 PM    Patient Name: [name]      Medical Record Number: [id number]  Date of Birth: 1945/09/05 Sex: Male         Room/Bed: 4W06C/4W06C-01 Payor Info: Payor: BLUE [employer]OSS BLUE [employer]IELD OF  MEDICARE / Plan: BLUE MEDICARE / Product Type: *No Product type* /    Admitting Diagnosis: Deconditioning Laminectomy  Admit Date/Time:  05/27/2013  4:19 PM Admission Comments: No comment available   Primary Diagnosis:  <principal problem not specified> Principal Problem: <principal problem not specified>  Patient Active Problem List   Diagnosis Date Noted  . Lumbar post-laminectomy syndrome 05/27/2013  . Degenerative arthritis of spine 05/26/2013  . Hemochromatosis 05/26/2013  . Wound infection after surgery 05/26/2013  . Normocytic anemia 05/26/2013  . Unintentional weight loss 05/26/2013  . Protein-calorie malnutrition, severe 05/25/2013  . Hyponatremia 05/23/2013  . SIRS (systemic inflammatory response syndrome) 05/23/2013  . Dehydration 05/23/2013  . Fever 05/23/2013  . Chronic narcotic use 05/23/2013  . Opioid overdose 05/06/2013  . Acute respiratory failure with hypoxia 05/06/2013  . COPD (chronic obstructive pulmonary disease) 05/06/2013  . Acute renal failure 05/06/2013  . Acute encephalopathy 05/06/2013  . Frequent falls 05/06/2013  . Encounter for postoperative wound care 05/06/2013  . Atrial fibrillation 08/07/2010  . Rheumatoid arthritis(714.0) 10/18/2008  . DYSLIPIDEMIA 03/30/2007  . PSEUDOGOUT 03/30/2007  . HYPERTENSION 03/30/2007  . CAD 03/30/2007    Expected Discharge Date: Expected Discharge Date: 06/08/13  Team Members Present: Physician leading conference: Dr. Faith Rogue Social Worker Present: Amada Jupiter, LCSW Nurse Present: Carmie End, RN PT Present: Karolee Stamps, PT OT Present: Edwin Cap, Loistine Chance, OT;Frank Trenton, OT SLP Present: Feliberto Gottron, SLP PPS Coordinator present : Tora Duck, RN, CRRN;Becky Henrene Dodge, PT     Current Status/Progress Goal Weekly Team Focus  Medical   back surgery with complications, SIADH, vocal cord trauma  increase capacity to tolerate physical activity  ID work up. SIADH treatment, pain control   Bowel/Bladder   foley catheter in place; draining large amts;  LBM 2/21 after Sorbitol.  continent bowel and bladder without device   monitor condition to discontinue foley    Swallow/Nutrition/ Hydration             ADL's   steady assist for dynamic standing, total assist for LB ADLs, min assist for UB ADLs  overall supervision  ADL retraining, introducing AE (hip kit), functional mobility, dynamic standing, functional transfers, overall activity tolerance/endurance   Mobility   Min A stand pivot transfers, gait, bed mobility, and static and dynamic standing balance; Mod A stairs   S ambulatory level; S stairs   gait, stand pivot transfers, bed mobility, stairs, static and dynamic standing balance, endurance, bilateral LE strength, and discharge planning   Communication   Mod A  Supervision  diaphragmatic breathing exercises and vocal function exercises   Safety/Cognition/ Behavioral Observations  using bed alarm; no unsafe behaviors noted  to keep patient safe from injury due to falls, etc.  monitor for unsafe behaviors; continue bed alarm   Pain   taking Dilaudid 4 mg q 8 hrs prn with decrease in pain level; Oxycontin 10 mg q 12 hrs  < 4   monitor for pain and treat appropriately   Skin   dsg intact to lower back incision with dsg daily dsg changes; redness to scrotal area  to keep skin free from infection or breakdown  monitor skin q shift; continue daily dsg changes    Rehab Goals Patient on target to meet rehab goals: Yes *See Care Plan and progress notes for long and short-term goals.  Barriers to Discharge: medical issues, balacne    Possible Resolutions to Barriers:  rx above,  supervision, education and equipment to reduce fall risk    Discharge Planning/Teaching Needs:  most likely home with son and daughter-in-law with 24/7 assistance      Team Discussion:  Having pain with foley - will remove.  Continues to have fluid collection on back - will culture.  Still with very poor po intake.  Checking chest xray due to continued wheezing - hope not due to swallow issues.  Close supervision goals due to risk of fall.    Revisions to Treatment Plan:  Request neuropsych consult for coping.   Continued Need for Acute Rehabilitation Level of Care: The patient requires daily medical management by a physician with specialized training in physical medicine and rehabilitation for the following conditions: Daily direction of a multidisciplinary physical rehabilitation program to ensure safe treatment while eliciting the highest outcome that is of practical value to the patient.: Yes Daily medical management of patient stability for increased activity during participation in an intensive rehabilitation regime.: Yes Daily analysis of laboratory values and/or radiology reports with any subsequent need for medication adjustment of medical intervention for : Post surgical problems;Other;Neurological problems  Aspynn Clover 06/01/2013, 4:38 PM

## 2013-06-01 NOTE — Progress Notes (Signed)
Patient ID: [name], male   DOB: June 15, 1945, 68 y.o.   MRN: [id number] Saw the mri last night and spoke with dr Naaman Plummer this am. Will ger radiology to drain the fluid collection to r/o the possibility of being a source of infection. Radiologically does not look like but its worth to do it

## 2013-06-01 NOTE — Progress Notes (Signed)
Speech Language Pathology Daily Session Note  Patient Details  Name: [name] MRN: [id number] Date of Birth: 1945/10/06  Today's Date: 06/01/2013 Time: 6067-7034 Time Calculation (min): 40 min  Short Term Goals: Week 1: SLP Short Term Goal 1 (Week 1): Patient will perform diaphragmatic breathing and respiratory support and control exercises with minimal cues. SLP Short Term Goal 2 (Week 1): Patient will perform vocal adduction and sustained phonation exercises with minimal cues. SLP Short Term Goal 3 (Week 1): SLP will determine benefit from NMES for use in voice treatment.  Skilled Therapeutic Interventions: Skilled treatment focused on speech goals. Upon arrival, pt sitting up in the wheelchair consuming breakfast meal of regular textures with thin liquids via straws. Pt without overt s/s of aspiration.  SLP facilitated session by providing Mod A verbal and visual cues for utilization of diaphragmatic breathing without verbalizations. Pt demonstrated emergent awareness into difficulty with task. Pt was essentially aphonic throughout the session but had spontaneous phonation for ~1 second at the phrase level. Pt also educated on strategies to increase vocal hygiene, pt given handout to reinforce information. Continue with current plan of care.    FIM:  Comprehension Comprehension Mode: Auditory Comprehension: 6-Follows complex conversation/direction: With extra time/assistive device Expression Expression Mode: Verbal Expression: 3-Expresses basic 50 - 74% of the time/requires cueing 25 - 50% of the time. Needs to repeat parts of sentences. Social Interaction Social Interaction: 6-Interacts appropriately with others with medication or extra time (anti-anxiety, antidepressant). Problem Solving Problem Solving: 6-Solves complex problems: With extra time Memory Memory: 6-Assistive device: No helper  Pain Pain Assessment Pain Assessment: 0-10 Pain Score: 4  Pain Type: Surgical  pain Pain Location: Back Pain Orientation: Lower Pain Descriptors / Indicators: Aching Pain Frequency: Constant Pain Onset: On-going Patients Stated Pain Goal: 4 Pain Intervention(s): Medication (See eMAR)  Therapy/Group: Individual Therapy  [name] 06/01/2013, 2:02 PM

## 2013-06-01 NOTE — Progress Notes (Signed)
Subjective/Complaints: Feeling better overall. Irritated back perhaps a little twisting in bed. Ate a little more yesterday A 12 point review of systems has been performed and if not noted above is otherwise negative.   Objective: Vital Signs: Blood pressure 130/74, pulse 61, temperature 97.4 F (36.3 C), temperature source Oral, resp. rate 18, weight 57.6 kg (126 lb 15.8 oz), SpO2 95.00%. Mr Lumbar Spine W Wo Contrast  05/31/2013   CLINICAL DATA:  Fever, evaluate for postoperative infection.  EXAM: MRI LUMBAR SPINE WITHOUT AND WITH CONTRAST  TECHNIQUE: Multiplanar and multiecho pulse sequences of the lumbar spine were obtained without and with intravenous contrast.  CONTRAST:  50m MULTIHANCE GADOBENATE DIMEGLUMINE 529 MG/ML IV SOLN  COMPARISON:  DG ABD 1 VIEW dated 05/29/2013; DG LUMBAR SPINE 2-3V dated 05/20/2013  FINDINGS: The patient is status post L1 through S1 bilateral pedicle screw and rod fixation with bridging rod at L4. L4-5 interbody disc fusion material. Grade 1 L4-5 anterolisthesis, with severe degenerative disc disease of the non surgically altered discs. Moderate acute on chronic discogenic endplate changes at LF6-E3 with more chronic appearing moderate to severe chronic discogenic endplate changes LP2-9 moderate L2-3 and L4-5. No suspicious osseous or intradiscal enhancement.  Bright T2, low T1 nonenhancing paraspinal 3 x 3.8 x 10.6 cm (AP by transverse by cc) fluid collection along the surgical approach. Paraspinal muscle denervation without suspicious enhancement of the paraspinal muscles.  Conus medullaris terminates at L1 and appears normal in morphology and signal characteristics. Cauda equina is unremarkable, specifically no nerve root clumping. No abnormal cord, leptomeningeal nor epidural enhancement. Partially imaged colonic diverticulosis. Right renal parapelvic cyst.  Level by level evaluation (please though, evaluation of the neural foramen limited by susceptibility  hardware artifact):  T12-L1: No disc bulge, canal stenosis nor neural foraminal narrowing. Please note, the left L1 pedicle screw appears to traverse the left lateral recess, axial 6/36.  L1-2: Posterior decompression, no canal stenosis or definite neural foraminal narrowing.  L2-3: Small broad-based disc osteophyte complex, status post posterior decompression without canal stenosis. No definite neural foraminal narrowing.  L3-4: Small broad-based disc osteophyte complex, status post posterior decompression without canal stenosis. No definite neural foraminal narrowing.  L4-5: Anterolisthesis. Posterior decompression without canal stenosis. Suspected mild right neural foraminal narrowing.  L5-S1: Transitional anatomy, sacralized L5 vertebral body. Posterior decompression without canal stenosis, at this level may be acute. Left central 6 mm contiguous disc extrusion, status post possible discectomy, there is posterior talus placement of the traversing left S1 nerve with enhancing T2 bright presumed early granulation tissue within the left lateral recess. Enhancing edematous left S1 nerve.  IMPRESSION: No MR findings of infection within the lumbar spine.  Status post L1 through L5 laminectomies with posterior instrumentation. Paraspinal fluid collection along the surgical approach favors seroma, and is unlikely to reflect pseudomeningocele.  Small left central L5-S1 disc extrusion, status post suspected ipsilateral discectomy, with enhancing granulation tissue within the lateral recess, and left S1 neuritis.   Electronically Signed   By: CElon Alas  On: 05/31/2013 01:54    Recent Labs  05/30/13 0700 05/31/13 0620  WBC 16.1* 9.2  HGB 13.3 11.7*  HCT 37.0* 33.6*  PLT 390 337    Recent Labs  05/31/13 0620 06/01/13 0450  NA 123* 126*  K 3.6* 3.5*  CL 85* 89*  GLUCOSE 92 87  BUN 8 7  CREATININE 0.51 0.46*  CALCIUM 7.8* 7.9*   CBG (last 3)  No  results found for this basename: GLUCAP,  in  the last 72 hours  Wt Readings from Last 3 Encounters:  06/01/13 57.6 kg (126 lb 15.8 oz)  05/27/13 61.75 kg (136 lb 2.1 oz)  05/18/13 58.968 kg (130 lb)    Physical Exam:  HENT: oral mucosa pink and moist, voice dysphonic Head: Normocephalic.  Eyes: EOM are normal.  Neck: Normal range of motion. Neck supple. No thyromegaly present.  Cardiovascular: Normal rate and regular rhythm.  Respiratory: Effort normal and breath sounds normal. No respiratory distress. Cough with upper airway sounds. No rales. GI: Soft. Bowel sounds are normal. He exhibits no distension. Soft. Non tender.   Patient was able to state his name and age. He was a poor medical historian , place is [place]  Skin: still dipahoretic Back incision is clean with granulation tissue noted, lumbar incision approximately 1 cm deep with slight drainage, no odor.  dressing in place  Neuro:  Motor strength remains 4/5 bilateral deltoid, bicep, tricep, grip, hip flexor, knee extensors, ankle dorsiflexor plantar flexion  Sensory intact to light touch in bilateral upper and lower limb Psych: pleasant and appropriate  Assessment/Plan: 1. Functional deficits secondary to Lumbar stenosis s/p L1-S1 fusion which require 3+ hours per day of interdisciplinary therapy in a comprehensive inpatient rehab setting. Physiatrist is providing close team supervision and 24 hour management of active medical problems listed below. Physiatrist and rehab team continue to assess barriers to discharge/monitor patient progress toward functional and medical goals. FIM: FIM - Bathing Bathing Steps Patient Completed: Front perineal area;Right upper leg;Left upper leg Bathing: 0: Activity did not occur  FIM - Upper Body Dressing/Undressing Upper body dressing/undressing steps patient completed: Thread/unthread right sleeve of pullover shirt/dresss;Thread/unthread left sleeve of pullover shirt/dress;Pull shirt over trunk Upper body  dressing/undressing: 0: Activity did not occur FIM - Lower Body Dressing/Undressing Lower body dressing/undressing steps patient completed: Thread/unthread right pants leg;Thread/unthread left pants leg;Pull pants up/down Lower body dressing/undressing: 1: Total-Patient completed less than 25% of tasks  FIM - Toileting Toileting steps completed by patient: Adjust clothing prior to toileting;Performs perineal hygiene;Adjust clothing after toileting Toileting Assistive Devices: Grab bar or rail for support Toileting: 0: Activity did not occur  FIM - Radio producer Devices: Product manager Transfers: 0-Activity did not occur  FIM - Control and instrumentation engineer Devices: Environmental consultant;Bed rails Bed/Chair Transfer: 3: Supine > Sit: Mod A (lifting assist/Pt. 50-74%/lift 2 legs;2: Sit > Supine: Max A (lifting assist/Pt. 25-49%);3: Bed > Chair or W/C: Mod A (lift or lower assist);3: Chair or W/C > Bed: Mod A (lift or lower assist)  FIM - Locomotion: Wheelchair Locomotion: Wheelchair: 0: Activity did not occur FIM - Locomotion: Ambulation Locomotion: Ambulation Assistive Devices: Administrator Ambulation/Gait Assistance: 4: Min assist Locomotion: Ambulation: 2: Travels 50 - 149 ft with minimal assistance (Pt.>75%)  Comprehension Comprehension Mode: Auditory Comprehension: 6-Follows complex conversation/direction: With extra time/assistive device  Expression Expression Mode: Verbal Expression: 6-Expresses complex ideas: With extra time/assistive device  Social Interaction Social Interaction: 6-Interacts appropriately with others with medication or extra time (anti-anxiety, antidepressant).  Problem Solving Problem Solving: 5-Solves basic 90% of the time/requires cueing < 10% of the time  Memory Memory: 6-Assistive device: No helper Medical Problem List and Plan:  1. Lumbar postlaminectomy syndrome with postoperative infection and  deconditioning. Patient with history of multiple back surgeries  2. DVT Prophylaxis/Anticoagulation: Subcutaneous Lovenox. Monitor platelet counts and any signs of bleeding  3. Pain Management: Neurontin 600 mg twice  a day, OxyContin sustained-release 10 mg every 12 hours, Dilaudid 4 mg every 8 hours moderate to severe pain   -controlled at present 4. Mood/depression. Prozac 40 mg twice a day, Xanax 0.5 mg 3 times a day as needed. Provide emotional support and  5. Neuropsych: This patient is not capable of making decisions on his own behalf.  6. Wound. Followup wound care nurse with dressings to back wound as directed  7. Hoarseness of voice after recent back surgery. Followup ENT Dr. Thera Flake.   normal scope/laryngoscopic exam. Continued followup speech therapy. Voice quality improving slowly 8. Rheumatoid arthritis. Chronic prednisone therapy. hold Humira for now. On 40 mg subcutaneous every 14 days as prior to admission  9. Hypertension. Cozaar 50 mg daily, Lopressor 25 mg twice a day. Dc hctz, clonidine prn.  10. Hyperlipidemia. Zocor.  11. COPD. Spiriva daily, albuterol nebulizer every 2 hours as needed. Check oxygen saturations every shift  12. Nausea--  -improved but still not eating much, see below 13. Low grade temp/ID, early sepsis?  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

## 2013-06-01 NOTE — Progress Notes (Signed)
Occupational Therapy Session Notes  Patient Details  Name: [name] MRN: [id number] Date of Birth: [birth year]  Today's Date: 06/01/2013  Short Term Goals: Week 1:  OT Short Term Goal 1 (Week 1): Short Term Goals = Long Term Goals  Skilled Therapeutic Interventions/Progress Updates:   Session #1 [phone] - 5 Minutes Individual Therapy Patient with 5/10 complaints of pain, but stated he recently had something for pain Upon entering room, patient found supine in bed. Patient stated he was feeling better today. Patient engaged in bed mobility, sat edge of bed, then performed stand pivot transfer > w/c. Patient sat at sink for grooming tasks of brushing teeth, washing face, and shampoo/combing hair. UB/LB bathing & dressing tasks completed from here. Patient stood multiple times and was able to maintain dynamic standing balance with supervision for ~3 minutes. Patient takes more than reasonable amount of time to complete tasks and took multiple rest breaks throughout session. At end of session, left patient seated in w/c beside bed with all needed items within reach. Therapist administered ice pack>patient secondary to patient with complaints of right shoulder pain.   Session #2 [id number] - 30 Minutes Individual Therapy No complaints of pain Upon entering room, patient found supine in bed eating lunch. Patient engaged in bed mobility with supervision and sat edge of bed for focus on LB ADLs of doffing socks and donning shoes. Therapist administered elastic shoe strings > bilateral shoes to help increase independence with donning of shoes. Patient stood with RW then focused on functional ambulation around nurses station>dayroom>back to room with steady assist. At end of session, left patient seated in w/c beside bed with all needed items within reach. Patient's daughter present at end of session.   Precautions:  Precautions Precautions: Back Type of Shoulder Precautions: Pt able to  state 3/3 precautions with min verbal cueing Precaution Comments: pt verbalized understanding of 3/3 back precautions Required Braces or Orthoses:  (patient wears spinal brace but no brace ordered) Spinal Brace: Lumbar corset;Applied in sitting position Restrictions Weight Bearing Restrictions: No  See FIM for current functional status  [name] 06/01/2013, 7:22 AM

## 2013-06-02 ENCOUNTER — Inpatient Hospital Stay (HOSPITAL_COMMUNITY): Payer: Medicare Other

## 2013-06-02 ENCOUNTER — Inpatient Hospital Stay (HOSPITAL_COMMUNITY): Payer: Medicare Other | Admitting: Occupational Therapy

## 2013-06-02 ENCOUNTER — Encounter (HOSPITAL_COMMUNITY): Payer: Medicare Other | Admitting: Occupational Therapy

## 2013-06-02 ENCOUNTER — Inpatient Hospital Stay (HOSPITAL_COMMUNITY): Payer: Medicare Other | Admitting: Speech Pathology

## 2013-06-02 ENCOUNTER — Encounter (HOSPITAL_COMMUNITY): Payer: Medicare Other

## 2013-06-02 DIAGNOSIS — G9340 Encephalopathy, unspecified: Secondary | ICD-10-CM

## 2013-06-02 DIAGNOSIS — M961 Postlaminectomy syndrome, not elsewhere classified: Secondary | ICD-10-CM

## 2013-06-02 LAB — CULTURE, RESPIRATORY: Culture: NO GROWTH

## 2013-06-02 LAB — BASIC METABOLIC PANEL
BUN: 5 mg/dL — ABNORMAL LOW (ref 6–23)
CO2: 28 mEq/L (ref 19–32)
Calcium: 8.1 mg/dL — ABNORMAL LOW (ref 8.4–10.5)
Chloride: 92 mEq/L — ABNORMAL LOW (ref 96–112)
Creatinine, Ser: 0.5 mg/dL (ref 0.50–1.35)
GFR calc Af Amer: >90 mL/min (ref >90)
GFR calc non Af Amer: >90 mL/min (ref >90)
Glucose, Bld: 88 mg/dL (ref 70–99)
Potassium: 3.4 mEq/L — ABNORMAL LOW (ref 3.7–5.3)
Sodium: 130 mEq/L — ABNORMAL LOW (ref 137–147)

## 2013-06-02 LAB — CULTURE, RESPIRATORY W GRAM STAIN

## 2013-06-02 LAB — CBC
HCT: 30 % — ABNORMAL LOW (ref 39.0–52.0)
Hemoglobin: 10.4 g/dL — ABNORMAL LOW (ref 13.0–17.0)
MCH: 28.3 pg (ref 26.0–34.0)
MCHC: 34.7 g/dL (ref 30.0–36.0)
MCV: 81.7 fL (ref 78.0–100.0)
Platelets: 307 10*3/uL (ref 150–400)
RBC: 3.67 MIL/uL — ABNORMAL LOW (ref 4.22–5.81)
RDW: 14.6 % (ref 11.5–15.5)
WBC: 6 10*3/uL (ref 4.0–10.5)

## 2013-06-02 LAB — WOUND CULTURE: Culture: NO GROWTH

## 2013-06-02 MED ORDER — POTASSIUM CHLORIDE CRYS ER 20 MEQ PO TBCR
20.0000 meq | EXTENDED_RELEASE_TABLET | Freq: Every day | ORAL | Status: DC
  Administered 2013-06-03 – 2013-06-10 (×8): 20 meq via ORAL
  Filled 2013-06-02 (×11): qty 1

## 2013-06-02 MED ORDER — MEGESTROL ACETATE 400 MG/10ML PO SUSP
400.0000 mg | Freq: Two times a day (BID) | ORAL | Status: DC
  Administered 2013-06-02 – 2013-06-10 (×18): 400 mg via ORAL
  Filled 2013-06-02 (×20): qty 10

## 2013-06-02 MED ORDER — ENSURE COMPLETE PO LIQD
237.0000 mL | Freq: Three times a day (TID) | ORAL | Status: DC
  Administered 2013-06-03 – 2013-06-09 (×13): 237 mL via ORAL

## 2013-06-02 NOTE — Progress Notes (Signed)
Subjective/Complaints: Feeling better overall. Irritated back perhaps a little twisting in bed. Ate a little more yesterday A 12 point review of systems has been performed and if not noted above is otherwise negative.   Objective: Vital Signs: Blood pressure 126/75, pulse 59, temperature 98.1 F (36.7 C), temperature source Oral, resp. rate 18, weight 56.8 kg (125 lb 3.5 oz), SpO2 96.00%. Dg Chest 2 View  06/01/2013   CLINICAL DATA:  Cough, congestion and fever.  EXAM: CHEST  2 VIEW  COMPARISON:  05/30/2013  FINDINGS: Elevation the right hemidiaphragm is stable. Mild lung base opacity is likely atelectasis, scarring or a combination. It is also stable. Lungs are otherwise clear. No pleural effusion or pneumothorax.  Cardiac silhouette is normal in size.  Bilateral shoulder prostheses are stable. Bony thorax is diffusely demineralized.  IMPRESSION: No acute cardiopulmonary disease. Stable appearance from the recent prior study.   Electronically Signed   By: Lajean Manes M.D.   On: 06/01/2013 15:05    Recent Labs  05/31/13 0620 06/02/13 0545  WBC 9.2 6.0  HGB 11.7* 10.4*  HCT 33.6* 30.0*  PLT 337 307    Recent Labs  06/01/13 0450 06/02/13 0545  NA 126* 130*  K 3.5* 3.4*  CL 89* 92*  GLUCOSE 87 88  BUN 7 5*  CREATININE 0.46* 0.50  CALCIUM 7.9* 8.1*   CBG (last 3)  No results found for this basename: GLUCAP,  in the last 72 hours  Wt Readings from Last 3 Encounters:  06/02/13 56.8 kg (125 lb 3.5 oz)  05/27/13 61.75 kg (136 lb 2.1 oz)  05/18/13 58.968 kg (130 lb)    Physical Exam:  HENT: oral mucosa pink and moist, voice dysphonic Head: Normocephalic.  Eyes: EOM are normal.  Neck: Normal range of motion. Neck supple. No thyromegaly present.  Cardiovascular: Normal rate and regular rhythm.  Respiratory: Effort normal and breath sounds normal. No respiratory distress. Cough with upper airway sounds. No rales. GI: Soft. Bowel sounds are normal. He exhibits no  distension. Soft. Non tender.   Patient was able to state his name and age. He was a poor medical historian , place is [place]  Skin: still dipahoretic Back incision is clean with granulation tissue noted, lumbar incision approximately 1 cm deep with slight drainage, no odor.  dressing in place  Neuro:  Motor strength remains 4/5 bilateral deltoid, bicep, tricep, grip, hip flexor, knee extensors, ankle dorsiflexor plantar flexion  Sensory intact to light touch in bilateral upper and lower limb Psych: pleasant and appropriate  Assessment/Plan: 1. Functional deficits secondary to Lumbar stenosis s/p L1-S1 fusion which require 3+ hours per day of interdisciplinary therapy in a comprehensive inpatient rehab setting. Physiatrist is providing close team supervision and 24 hour management of active medical problems listed below. Physiatrist and rehab team continue to assess barriers to discharge/monitor patient progress toward functional and medical goals. FIM: FIM - Bathing Bathing Steps Patient Completed: Front perineal area;Right upper leg;Left upper leg;Chest;Right Arm;Left Arm;Abdomen;Buttocks Bathing: 4: Min-Patient completes 8-9 24f10 parts or 75+ percent  FIM - Upper Body Dressing/Undressing Upper body dressing/undressing steps patient completed: Thread/unthread right sleeve of pullover shirt/dresss;Thread/unthread left sleeve of pullover shirt/dress;Pull shirt over trunk Upper body dressing/undressing: 4: Min-Patient completed 75 plus % of tasks FIM - Lower Body Dressing/Undressing Lower body dressing/undressing steps patient completed: Pull underwear up/down;Pull pants up/down Lower body dressing/undressing: 1: Total-Patient completed less than 25% of tasks  FIM - TPsychologist, occupational  steps completed by patient: Adjust clothing prior to toileting;Performs perineal hygiene;Adjust clothing after toileting Toileting Assistive Devices: Grab bar or rail for support Toileting: 0:  Activity did not occur  FIM - Radio producer Devices: Product manager Transfers: 0-Activity did not occur  FIM - Control and instrumentation engineer Devices: Environmental consultant;Bed rails Bed/Chair Transfer: 5: Sit > Supine: Supervision (verbal cues/safety issues);5: Chair or W/C > Bed: Supervision (verbal cues/safety issues)  FIM - Locomotion: Wheelchair Locomotion: Wheelchair: 0: Activity did not occur FIM - Locomotion: Ambulation Locomotion: Ambulation Assistive Devices: Administrator Ambulation/Gait Assistance: 4: Min assist Locomotion: Ambulation: 4: Travels 150 ft or more with minimal assistance (Pt.>75%)  Comprehension Comprehension Mode: Auditory Comprehension: 6-Follows complex conversation/direction: With extra time/assistive device  Expression Expression Mode: Verbal Expression: 6-Expresses complex ideas: With extra time/assistive device  Social Interaction Social Interaction: 6-Interacts appropriately with others with medication or extra time (anti-anxiety, antidepressant).  Problem Solving Problem Solving: 5-Solves basic 90% of the time/requires cueing < 10% of the time  Memory Memory: 6-Assistive device: No helper Medical Problem List and Plan:  1. 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 Patient with history of multiple back surgeries  2. DVT Prophylaxis/Anticoagulation: Subcutaneous Lovenox. Monitor platelet counts and any signs of bleeding  3. Pain Management: Neurontin 600 mg twice a day, OxyContin sustained-release 10 mg every 12 hours, Dilaudid 4 mg every 8 hours moderate to severe pain   -controlled at present 4. Mood/depression. Prozac 40 mg twice a day, Xanax 0.5 mg 3 times a day as needed. Provide emotional support and  5. Neuropsych: This patient is not capable of making decisions on his own behalf.  6. Wound. Followup wound care nurse with dressings to back wound as directed  7.  Hoarseness of voice after recent back surgery. Followup ENT Dr. Thera Flake.   normal scope/laryngoscopic exam. Continued followup speech therapy. Voice quality improving slowly 8. Rheumatoid arthritis. Chronic prednisone therapy. hold Humira for now. On 40 mg subcutaneous every 14 days as prior to admission  9. Hypertension. Cozaar 50 mg daily, Lopressor 25 mg twice a day. Dc hctz, clonidine prn.  10. Hyperlipidemia. Zocor.  11. COPD. Spiriva daily, albuterol nebulizer every 2 hours as needed. Check oxygen saturations every shift  12. Nausea--  -improved but still not eating much, see below 13. Low grade temp/ID,   -wbc 9k  -low grade temp off and on---started levaquin on 2/23. 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

## 2013-06-02 NOTE — Progress Notes (Signed)
Reviewed and in agreement with treatment provided.  

## 2013-06-02 NOTE — Progress Notes (Signed)
Physical Therapy Session Note  Patient Details  Name: [name] MRN: [id number] Date of Birth: 08-24-1945  Today's Date: 06/02/2013 Time: 5830-9407 Time Calculation (min): 31 min  Short Term Goals: Week 1:  PT Short Term Goal 1 (Week 1): =LTG  Skilled Therapeutic Interventions/Progress Updates:    Session consisted of pt performing gait from the rehab gym to his room with S and the use of a RW in order to help improve endurance, stand pivot transfers with S (verbal cueing needed for pt to step towards the head of the bed when performing a w/c to bed transfer), and pt performing the following static standing balance activities with S-Min A: tandem stance (unable to maintain for more than 15 seconds without assistance), feet together (able to maintain for 1 minute without assistance), feet together eyes closed (unable to maintain for more than 20 seconds without assistance), and perturbations (pt showing good reactive balance in all directions tested).   Therapy Documentation Precautions:  Precautions Precautions: Back Type of Shoulder Precautions: Pt able to state 3/3 precautions with min verbal cueing Precaution Comments: pt verbalized understanding of 3/3 back precautions Required Braces or Orthoses:  (patient wears spinal brace but no brace ordered) Spinal Brace: Lumbar corset;Applied in sitting position Restrictions Weight Bearing Restrictions: No Pain:  Pt had no c/o pain during the session  See FIM for current functional status  Therapy/Group: Individual Therapy  Naama Sappington 06/02/2013, 3:35 PM

## 2013-06-02 NOTE — Progress Notes (Signed)
Agree with documentation and interventions as specified by dietetic intern.   Brynda Greathouse, MS RD LDN Clinical Inpatient Dietitian Pager: [phone] Weekend/After hours pager: [phone]

## 2013-06-02 NOTE — Progress Notes (Signed)
NUTRITION FOLLOW UP  DOCUMENTATION CODES  Per approved criteria   -Severe malnutrition in the context of chronic illness    Intervention:   Continue Ensure Complete TID, but between meal instead of with meals.  Provide bedtime snack (peanut butter crackers).  Consider bowel regimen if appropriate, last documented bowel movement was 2/21.   Nutrition Dx:   Malnutrition related to inadequate oral intake as evidenced by severe depletion of muscle mass and 19% weight loss in 3 months; Ongoing  Goal:   Pt to meet >/= 90% of estimated needs; Ongoing  Monitor:   Weight trends, labs, PO intake, I/O's  Assessment:   Patient is a 68 y.o. male with a Past Medical History of recent back surgery complicated by a wound infection, malnutrition, diarrhea, laryngitis, multiple hospitalizations over the past few months. Pt now admitted to rehab.  Pt meal completion 10-50%. Pt reports with a decreased appetite. Noted pt was provided Megace for appetite stimulant 2/25. Pt was educated on ordering high calorie high protein foods during meals. Pt was encouraged pt PO intake to help with energy level and with healing. Pt reports wanting snacks before bedtime (peanut butter crackers)--- RD will order. Pt was encouraged to drink his Ensure, but pt request to get them between meals instead of with his meals.  Labs: Low sodium, potassium, chloride, BUN, calcium  Height: Ht Readings from Last 1 Encounters:  05/23/13 5' 6"  (1.676 m)    Weight Status:   Wt Readings from Last 1 Encounters:  06/02/13 125 lb 3.5 oz (56.8 kg)  06/01/13 126 lb 05/31/13 122 lb 05/28/13 132 lb Admit wt (05/27/13)138 lb  Body mass index is 20.22 kg/(m^2).  Re-estimated needs:  Kcal: 1800-2000  Protein: 100-110 gm  Fluid: 1.8-2 L  Skin: intact  Diet Order: Cardiac   Intake/Output Summary (Last 24 hours) at 06/02/13 1350 Last data filed at 06/02/13 0915  Gross per 24 hour  Intake    720 ml  Output   1400 ml  Net    -680 ml    Last BM: 2/21-loose and large amount   Labs:   Recent Labs Lab 05/31/13 0620 06/01/13 0450 06/02/13 0545  NA 123* 126* 130*  K 3.6* 3.5* 3.4*  CL 85* 89* 92*  CO2 26 25 28   BUN 8 7 5*  CREATININE 0.51 0.46* 0.50  CALCIUM 7.8* 7.9* 8.1*  GLUCOSE 92 87 88    CBG (last 3)  No results found for this basename: GLUCAP,  in the last 72 hours  Scheduled Meds: . aspirin EC  81 mg Oral q morning - 10a  . cholecalciferol  1,000 Units Oral Daily  . docusate sodium  100 mg Oral BID  . enoxaparin (LOVENOX) injection  40 mg Subcutaneous Q24H  . feeding supplement (ENSURE COMPLETE)  237 mL Oral TID WC  . FLUoxetine  40 mg Oral BID  . fluticasone  2 spray Each Nare QHS  . gabapentin  600 mg Oral BID  . guaiFENesin  1,200 mg Oral BID  . levofloxacin  500 mg Oral Q2200  . losartan  50 mg Oral Daily  . megestrol  400 mg Oral BID  . metoprolol tartrate  25 mg Oral BID  . OxyCODONE  10 mg Oral Q12H  . pantoprazole  40 mg Oral BID  . predniSONE  5 mg Oral QPM  . simvastatin  40 mg Oral Q lunch  . tiotropium  18 mcg Inhalation Daily  . vancomycin  750 mg Intravenous  3 times per day    Continuous Infusions:   Kallie Locks Dietetic Intern Pager: [phone]

## 2013-06-02 NOTE — Progress Notes (Signed)
Physical Therapy Session Note  Patient Details  Name: [name] MRN: [id number] Date of Birth: [birth year]  Today's Date: 06/02/2013 Time: 5670-1410 Time Calculation (min): 47 min  Short Term Goals: Week 1:  PT Short Term Goal 1 (Week 1): =LTG  Skilled Therapeutic Interventions/Progress Updates:    Session consisted of pt performing gait from his room to the gym and back with close S and the use of a RW (verbal cueing needed for proper heel strike with pt also showing fatigue towards the end of gait) and an obstacle course with Min A and the use of a RW consisting of stepping over poles with RW (pt needed verbal cueing for proper RW sequencing), gait on a mat (to help prepare pt to walk on various surfaces), climbing and descending stairs 3 steps with use of bilateral handrails, and performing cone taps with bilateral LE's (pt needed bilateral UE support in order to perform this activity) in order to help improve pt's functional independence. The session also consisted of the pt performing the Nustep at level 4 x 9 minutes while only using his LE's in order to help improve pt's endurance and LE strength bilaterally.   Therapy Documentation Precautions:  Precautions Precautions: Back Type of Shoulder Precautions: Pt able to state 3/3 precautions with min verbal cueing Precaution Comments: pt verbalized understanding of 3/3 back precautions Required Braces or Orthoses:  (patient wears spinal brace but no brace ordered) Spinal Brace: Lumbar corset;Applied in sitting position Restrictions Weight Bearing Restrictions: No Pain:  Pt had no c/o pain during the session.   See FIM for current functional status  Therapy/Group: Individual Therapy  Aimee Heldman 06/02/2013, 11:19 AM

## 2013-06-02 NOTE — Plan of Care (Signed)
Problem: RH BOWEL ELIMINATION Goal: RH STG MANAGE BOWEL WITH ASSISTANCE STG Manage Bowel with Mod I Assistance.  Outcome: Not Progressing LBM 2/21 Goal: RH STG MANAGE BOWEL W/MEDICATION W/ASSISTANCE STG Manage Bowel with Medication with min Assistance.  Outcome: Not Progressing LBM 2/21, will administer PRN Miralax 2/25

## 2013-06-02 NOTE — Progress Notes (Signed)
Speech Language Pathology Daily Session Note  Patient Details  Name: [name] MRN: [id number] Date of Birth: 1945-10-09  Today's Date: 06/02/2013 Time: 4332-9518 Time Calculation (min): 25 min  Short Term Goals: Week 1: SLP Short Term Goal 1 (Week 1): Patient will perform diaphragmatic breathing and respiratory support and control exercises with minimal cues. SLP Short Term Goal 2 (Week 1): Patient will perform vocal adduction and sustained phonation exercises with minimal cues. SLP Short Term Goal 3 (Week 1): SLP will determine benefit from NMES for use in voice treatment.  Skilled Therapeutic Interventions: Skilled treatment focused on speech goals.  SLP facilitated session by providing Mod A verbal and visual cues for utilization of diaphragmatic breathing without verbalizations. Pt demonstrated emergent awareness into difficulty with task. Pt was essentially aphonic throughout the session but had spontaneous phonation for X 2 at the phrase level. Pt also performed vocal function exercises with Mod A multimodal cueing with sustained phonation for ~3 seconds.  Continue with current plan of care.   FIM:  Comprehension Comprehension Mode: Auditory Comprehension: 6-Follows complex conversation/direction: With extra time/assistive device Expression Expression Mode: Verbal Expression: 3-Expresses basic 50 - 74% of the time/requires cueing 25 - 50% of the time. Needs to repeat parts of sentences. Social Interaction Social Interaction: 6-Interacts appropriately with others with medication or extra time (anti-anxiety, antidepressant). Problem Solving Problem Solving: 5-Solves basic 90% of the time/requires cueing < 10% of the time Memory Memory: 6-Assistive device: No helper  Pain No/Denies Pain  Therapy/Group: Individual Therapy  Marcine Gadway 06/02/2013, 4:02 PM

## 2013-06-02 NOTE — Progress Notes (Signed)
Occupational Therapy Session Notes  Patient Details  Name: [name] MRN: [id number] Date of Birth: 1946/01/18  Today's Date: 06/02/2013  Short Term Goals: Week 1:  OT Short Term Goal 1 (Week 1): Short Term Goals = Long Term Goals  Skilled Therapeutic Interventions/Progress Updates:   Session #1 [phone] - 66 Minutes Individual Therapy Patient with 4/10 pain in lower back and right shoulder, RN made aware Upon entering room, patient found supine in bed. Patient with complaints of being up most the night secondary to urine output. Patient doffed brief in supine using reacher, then sat edge of bed with supervision. Patient performed stand pivot transfer > w/c with steady assist. Patient then sat at sink for grooming tasks of brushing teeth, shampooing hair, washing face, and washing hands. UB/LB bathing & dressing tasks completed from here, using AE prn in order to adhere to back precautions. Patient donned shirt with min assist, patient with pain in right shoulder requiring minimal assistance to get shirt over head. Patient able to thread bilateral LEs into brief and pants and pull both up to waist, but required assistance with donning of TEDs and shoes. Patient takes much more than a reasonable amount of time to complete tasks. At end of session, left patient seated in w/c beside bed with all needed items within reach.   Session #2 [id number] - 18 Minutes Individual Therapy No complaints of pain Upon entering room, patient found seated in w/c. Patient with complaints of fatigue, but willing to work with therapist. Therapist assisted patient with donning of optional lumbar corset. Patient then worked on self-propulsion of w/c, ~50 feet. Patient required assistance > ADL apartment due to pain in bilateral hands. Once near ADL apartment, patient engaged in functional ambulation using RW. Therapist discussed importance of safety with RW over various surfaces and around obstacles. Patient  engaged in furniture transfer with minimal assistance, bed mobility (sit>supine with supervision, log rolling left<>right with supervision, and supine>sit with min assist), and shower stall transfer using blue simulated block with minimal assistance. Patient able to adhere to back precautions independently during ADL activities.  After ADL apartment activities, patient ambulated > therapy gym and engaged in scapular retraction exercises & bicep strengthening exercises using 3lb dumbbells. At end of session, left patient supine on therapy mat waiting on PT for next therapy session; PT aware.   Precautions:  Precautions Precautions: Back Type of Shoulder Precautions: Pt able to state 3/3 precautions with min verbal cueing Precaution Comments: pt verbalized understanding of 3/3 back precautions Required Braces or Orthoses:  (patient wears spinal brace but no brace ordered) Spinal Brace: Lumbar corset;Applied in sitting position Restrictions Weight Bearing Restrictions: No  See FIM for current functional status  [name] 06/02/2013, 7:22 AM

## 2013-06-03 ENCOUNTER — Inpatient Hospital Stay (HOSPITAL_COMMUNITY): Payer: Medicare Other | Admitting: Speech Pathology

## 2013-06-03 ENCOUNTER — Inpatient Hospital Stay (HOSPITAL_COMMUNITY): Payer: Medicare Other | Admitting: Occupational Therapy

## 2013-06-03 ENCOUNTER — Inpatient Hospital Stay (HOSPITAL_COMMUNITY): Payer: Medicare Other

## 2013-06-03 ENCOUNTER — Inpatient Hospital Stay (HOSPITAL_COMMUNITY): Payer: Medicare Other | Admitting: Physical Therapy

## 2013-06-03 ENCOUNTER — Encounter (HOSPITAL_COMMUNITY): Payer: Medicare Other | Admitting: Occupational Therapy

## 2013-06-03 DIAGNOSIS — R5381 Other malaise: Secondary | ICD-10-CM

## 2013-06-03 LAB — BASIC METABOLIC PANEL
BUN: 5 mg/dL — ABNORMAL LOW (ref 6–23)
CO2: 27 mEq/L (ref 19–32)
Calcium: 8 mg/dL — ABNORMAL LOW (ref 8.4–10.5)
Chloride: 95 mEq/L — ABNORMAL LOW (ref 96–112)
Creatinine, Ser: 0.44 mg/dL — ABNORMAL LOW (ref 0.50–1.35)
GFR calc Af Amer: >90 mL/min (ref >90)
GFR calc non Af Amer: >90 mL/min (ref >90)
Glucose, Bld: 94 mg/dL (ref 70–99)
Potassium: 3.3 mEq/L — ABNORMAL LOW (ref 3.7–5.3)
Sodium: 132 mEq/L — ABNORMAL LOW (ref 137–147)

## 2013-06-03 MED ORDER — BISACODYL 10 MG RE SUPP
10.0000 mg | Freq: Every day | RECTAL | Status: DC | PRN
  Administered 2013-06-04: 10 mg via RECTAL
  Filled 2013-06-03: qty 1

## 2013-06-03 MED ORDER — METHOCARBAMOL 500 MG PO TABS
500.0000 mg | ORAL_TABLET | Freq: Four times a day (QID) | ORAL | Status: DC | PRN
  Administered 2013-06-03 – 2013-06-10 (×4): 500 mg via ORAL
  Filled 2013-06-03 (×4): qty 1

## 2013-06-03 MED ORDER — SENNOSIDES-DOCUSATE SODIUM 8.6-50 MG PO TABS
2.0000 | ORAL_TABLET | Freq: Two times a day (BID) | ORAL | Status: DC
  Administered 2013-06-03 – 2013-06-10 (×14): 2 via ORAL
  Filled 2013-06-03 (×13): qty 2

## 2013-06-03 MED ORDER — MUSCLE RUB 10-15 % EX CREA
TOPICAL_CREAM | CUTANEOUS | Status: DC | PRN
  Filled 2013-06-03: qty 85

## 2013-06-03 MED ORDER — BISACODYL 5 MG PO TBEC: 5.0000 mg | DELAYED_RELEASE_TABLET | Freq: Every day | ORAL | Status: DC | PRN

## 2013-06-03 MED ORDER — FLEET ENEMA 7-19 GM/118ML RE ENEM
1.0000 | ENEMA | Freq: Every day | RECTAL | Status: DC | PRN
  Administered 2013-06-04: 1 via RECTAL
  Filled 2013-06-03: qty 1

## 2013-06-03 MED ORDER — HYDROMORPHONE HCL 2 MG PO TABS
2.0000 mg | ORAL_TABLET | Freq: Four times a day (QID) | ORAL | Status: DC | PRN
  Administered 2013-06-03 – 2013-06-10 (×12): 2 mg via ORAL
  Filled 2013-06-03 (×14): qty 1

## 2013-06-03 NOTE — Progress Notes (Signed)
Physical Therapy Session Note  Patient Details  Name: [name] MRN: [id number] Date of Birth: 08/10/1945  Today's Date: 06/03/2013 Time: 2355-7322 Time Calculation (min): 30 min  Short Term Goals: Week 1:  PT Short Term Goal 1 (Week 1): =LTG  Skilled Therapeutic Interventions/Progress Updates:    Session consisted of pt performing gait up and down the hallway with close S and the use of a RW with pt performing dual task activities (pt asked to look in various directions and carry on a conversation while performing gait) and pt performing an activity with close S and the use of a RW where he stepped over a pole (verbal cueing needed for proper RW sequencing) and also performed gait while looking in various directions (pt with decreased awareness of foot position when performing this task) in order to improve functional independence. Session had to be cut 15 minutes short due to increasing stomach pain with RN being notified of this. Pt also informed me that his son and daughter in law would be able to come in at 3:30 tomorrow for family education.   Therapy Documentation Precautions:  Precautions Precautions: Back Type of Shoulder Precautions: Pt able to state 3/3 precautions with min verbal cueing Precaution Comments: pt verbalized understanding of 3/3 back precautions Required Braces or Orthoses:  (patient wears spinal brace but no brace ordered) Spinal Brace: Lumbar corset;Applied in sitting position Restrictions Weight Bearing Restrictions: No General: Amount of Missed PT Time (min): 15 Minutes Missed Time Reason: Pain (stomach pain) Pain:  Pt c/o 7/10 stomach pain which worsened with movement with pt describing that he felt "constipated". The RN was notified of this, pt took multiple rest breaks, and the session was cut short due to pt's stomach pain.   See FIM for current functional status  Therapy/Group: Individual Therapy  Shell Blanchette 06/03/2013, 4:02 PM

## 2013-06-03 NOTE — Progress Notes (Signed)
Reviewed and in agreement with treatment provided.  

## 2013-06-03 NOTE — Progress Notes (Signed)
Occupational Therapy Session Notes  Patient Details  Name: [name] MRN: [id number] Date of Birth: [birth year]  Today's Date: 06/03/2013  Short Term Goals: Week 1:  OT Short Term Goal 1 (Week 1): Short Term Goals = Long Term Goals  Skilled Therapeutic Interventions/Progress Updates:   Session #1 [phone] - 80 Minutes Individual Therapy Patient with 4/10 pain from neck>lower back, RN and DR aware Upon entering room, patient found supine in bed eating breakfast, patient not finished with breakfast but willing to finish after OT session. Dr present and patient talked with Dr about neck>back radiating pain. Patient engaged in bed mobility and sat edge of bed to doff brief and pants using reacher, then donn socks using sock aid. From here, patient performed stand pivot transfer > w/c with close supervision using RW. UB/LB bathing tasks completed in sit<>stand position at sink. Patient able to maintain dynamic standing balance during peri care for ~3 minutes with close supervision and no rest breaks. Patient able to thread bilateral LEs into pants without using reacher, crossing BLEs technique. Patient stood to pull brief and pants up to waist with supervision.  At end of session, left patient seated in w/c beside bed with all needed items within reach.   Session #2 [id number] - 40 Minutes Individual Therapy No complaints of direct pain, but patient with complaints of a constipation pain; RN aware Upon entering room, patient found seated in w/c. Patient propelled self > therapy gym using bilateral LEs. Patient transferred > Nustep machine with supervision and min verbal cues for safe transfer. Patient completed 10 minutes on level 4 in order to work on increasing patient's overall activity tolerance/endurance and strength throughout BLEs for increased independence with dynamic standing functional tasks. Patient then engaged in dynamic standing activity for focus on increasing independence  with dynamic standing balance/tolerance/endurance. Therapist propelled patient back to room and assisted patient > elevated toilet seat. Patient able to perform toileting task of clothing management prior to toileting. Therapist discussed calling nursing staff when finished with toileting, patient stated he understood; therapist notified NT.   Precautions:  Precautions Precautions: Back Type of Shoulder Precautions: Pt able to state 3/3 precautions with min verbal cueing Precaution Comments: pt verbalized understanding of 3/3 back precautions Required Braces or Orthoses:  (patient wears spinal brace but no brace ordered) Spinal Brace: Lumbar corset;Applied in sitting position Restrictions Weight Bearing Restrictions: No  See FIM for current functional status  [name] 06/03/2013, 7:40 AM

## 2013-06-03 NOTE — Progress Notes (Signed)
Subjective/Complaints: Left sided neck pain last noc no radiation to Left arm A 12 point review of systems has been performed and if not noted above is otherwise negative.  Review of Systems - Negative except 2 episodes of shooting pain neck and back area Objective: Vital Signs: Blood pressure 123/78, pulse 70, temperature 98.2 F (36.8 C), temperature source Oral, resp. rate 18, weight 57.5 kg (126 lb 12.2 oz), SpO2 95.00%. Dg Chest 2 View  06/01/2013   CLINICAL DATA:  Cough, congestion and fever.  EXAM: CHEST  2 VIEW  COMPARISON:  05/30/2013  FINDINGS: Elevation the right hemidiaphragm is stable. Mild lung base opacity is likely atelectasis, scarring or a combination. It is also stable. Lungs are otherwise clear. No pleural effusion or pneumothorax.  Cardiac silhouette is normal in size.  Bilateral shoulder prostheses are stable. Bony thorax is diffusely demineralized.  IMPRESSION: No acute cardiopulmonary disease. Stable appearance from the recent prior study.   Electronically Signed   By: Lajean Manes M.D.   On: 06/01/2013 15:05    Recent Labs  06/02/13 0545  WBC 6.0  HGB 10.4*  HCT 30.0*  PLT 307    Recent Labs  06/02/13 0545 06/03/13 0515  NA 130* 132*  K 3.4* 3.3*  CL 92* 95*  GLUCOSE 88 94  BUN 5* 5*  CREATININE 0.50 0.44*  CALCIUM 8.1* 8.0*   CBG (last 3)  No results found for this basename: GLUCAP,  in the last 72 hours  Wt Readings from Last 3 Encounters:  06/03/13 57.5 kg (126 lb 12.2 oz)  05/27/13 61.75 kg (136 lb 2.1 oz)  05/18/13 58.968 kg (130 lb)    Physical Exam:  HENT: oral mucosa pink and moist, voice dysphonic Head: Normocephalic.  Eyes: EOM are normal.  Neck: Normal range of motion. Neck supple. No thyromegaly present.  Cardiovascular: Normal rate and regular rhythm.  Respiratory: Effort normal and breath sounds normal. No respiratory distress. Cough with upper airway sounds. No rales. GI: Soft. Bowel sounds are normal. He exhibits no  distension. Soft. Non tender.   Patient was able to state his name and age. He was a poor medical historian , place is [place]  Skin: still dipahoretic Back incision is clean with granulation tissue noted, lumbar incision approximately 1 cm deep with slight drainage, no odor.  dressing in place  Neuro:  Motor strength remains 4/5 bilateral deltoid, bicep, tricep, grip, hip flexor, knee extensors, ankle dorsiflexor plantar flexion  Sensory intact to light touch in bilateral upper and lower limb Psych: pleasant and appropriate  Assessment/Plan: 1. Functional deficits secondary to Lumbar stenosis s/p L1-S1 fusion which require 3+ hours per day of interdisciplinary therapy in a comprehensive inpatient rehab setting. Physiatrist is providing close team supervision and 24 hour management of active medical problems listed below. Physiatrist and rehab team continue to assess barriers to discharge/monitor patient progress toward functional and medical goals. FIM: FIM - Bathing Bathing Steps Patient Completed: Front perineal area;Right upper leg;Left upper leg;Chest;Right Arm;Left Arm;Abdomen;Buttocks Bathing: 4: Min-Patient completes 8-9 [id number] parts or 75+ percent  FIM - Upper Body Dressing/Undressing Upper body dressing/undressing steps patient completed: Thread/unthread right sleeve of pullover shirt/dresss;Thread/unthread left sleeve of pullover shirt/dress;Pull shirt over trunk Upper body dressing/undressing: 4: Min-Patient completed 75 plus % of tasks FIM - Lower Body Dressing/Undressing Lower body dressing/undressing steps patient completed: Pull underwear up/down;Pull pants up/down;Thread/unthread right underwear leg;Thread/unthread left underwear leg;Thread/unthread right pants leg;Thread/unthread left pants leg Lower body dressing/undressing: 4: Min-Patient completed 75  plus % of tasks  FIM - Toileting Toileting steps completed by patient: Adjust clothing prior to toileting;Performs  perineal hygiene;Adjust clothing after toileting Toileting Assistive Devices: Grab bar or rail for support Toileting: 0: Activity did not occur  FIM - Radio producer Devices: Grab bars Toilet Transfers: 0-Activity did not occur  FIM - Control and instrumentation engineer Devices: Arm rests;Walker Bed/Chair Transfer: 5: Chair or W/C > Bed: Supervision (verbal cues/safety issues);5: Sit > Supine: Supervision (verbal cues/safety issues)  FIM - Locomotion: Wheelchair Locomotion: Wheelchair: 0: Activity did not occur FIM - Locomotion: Ambulation Locomotion: Ambulation Assistive Devices: Administrator Ambulation/Gait Assistance: 4: Min assist Locomotion: Ambulation: 5: Travels 150 ft or more with supervision/safety issues  Comprehension Comprehension Mode: Auditory Comprehension: 6-Follows complex conversation/direction: With extra time/assistive device  Expression Expression Mode: Verbal Expression: 6-Expresses complex ideas: With extra time/assistive device  Social Interaction Social Interaction: 6-Interacts appropriately with others with medication or extra time (anti-anxiety, antidepressant).  Problem Solving Problem Solving: 5-Solves basic 90% of the time/requires cueing < 10% of the time  Memory Memory: 6-Assistive device: No helper Medical Problem List and Plan:  1. 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 Patient with history of multiple back surgeries  2. DVT Prophylaxis/Anticoagulation: Subcutaneous Lovenox. Monitor platelet counts and any signs of bleeding  3. Pain Management: Neurontin 600 mg twice a day, OxyContin sustained-release 10 mg every 12 hours, Dilaudid 4 mg every 8 hours moderate to severe pain   -controlled at present, add anagesic creme and Kpad for neck 4. Mood/depression. Prozac 40 mg twice a day, Xanax 0.5 mg 3 times a day as needed. Provide emotional support and  5.  Neuropsych: This patient is not capable of making decisions on his own behalf.  6. Wound. Followup wound care nurse with dressings to back wound as directed  7. Hoarseness of voice after recent back surgery. Followup ENT Dr. Thera Flake.   normal scope/laryngoscopic exam. Continued followup speech therapy. Voice quality improving slowly 8. Rheumatoid arthritis. Chronic prednisone therapy. hold Humira for now. On 40 mg subcutaneous every 14 days as prior to admission  9. Hypertension. Cozaar 50 mg daily, Lopressor 25 mg twice a day. Dc hctz, clonidine prn.  10. Hyperlipidemia. Zocor.  11. COPD. Spiriva daily, albuterol nebulizer every 2 hours as needed. Check oxygen saturations every shift  12. Nausea--  -improved but still not eating much, see below 13. Low grade temp/ID,   -wbc 9k  -low grade temp off and on---started levaquin on 2/23. 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

## 2013-06-03 NOTE — Progress Notes (Signed)
Patient ID: [name], male   DOB: [birth year], 68 y.o.   MRN: [id number] Stable, IR drainage of fluid collection pending. He feels he is getting stronger

## 2013-06-03 NOTE — Progress Notes (Signed)
Physical Therapy Note  Patient Details  Name: [name] MRN: [id number] Date of Birth: 07-Feb-1946 Today's Date: 06/03/2013  Time: 1010-1037 27 minutes  1:1 No c/o pain.  Gait in controlled environment with close supervision with RW, pt with several LOB with head turns requiring min A to correct.  Standing balance training step ups, tap ups, controlled sit to stands with min-mod A for balance without RW.  Gait without RW with mod A for balance especially during turns.   [name] 06/03/2013, 10:37 AM

## 2013-06-03 NOTE — Progress Notes (Signed)
Speech Language Pathology Daily Session Note  Patient Details  Name: [name] MRN: [id number] Date of Birth: April 06, 1946  Today's Date: 06/03/2013 Time: 0800-0830 Time Calculation (min): 30 min  Short Term Goals: Week 1: SLP Short Term Goal 1 (Week 1): Patient will perform diaphragmatic breathing and respiratory support and control exercises with minimal cues. SLP Short Term Goal 2 (Week 1): Patient will perform vocal adduction and sustained phonation exercises with minimal cues. SLP Short Term Goal 3 (Week 1): SLP will determine benefit from NMES for use in voice treatment.  Skilled Therapeutic Interventions: Skilled treatment focused on speech goals.  Upon arrival, pt supine in bed. SLP facilitated session by providing Min A verbal and visual cues for utilization of diaphragmatic breathing without verbalizations. Pt performed vocal function exercises with Mod A multimodal cueing with sustained phonation for "ah and "e" for ~5 seconds.  Pt also demonstrated increased phonation at the phrase level with Mod A verbal cues.  Continue with current plan of care.   FIM:  Comprehension Comprehension Mode: Auditory Comprehension: 6-Follows complex conversation/direction: With extra time/assistive device Expression Expression Mode: Verbal Expression: 4-Expresses basic 75 - 89% of the time/requires cueing 10 - 24% of the time. Needs helper to occlude trach/needs to repeat words. Social Interaction Social Interaction: 6-Interacts appropriately with others with medication or extra time (anti-anxiety, antidepressant). Problem Solving Problem Solving: 5-Solves complex 90% of the time/cues < 10% of the time Memory Memory: 6-More than reasonable amt of time  Pain No/Denies Pain  Therapy/Group: Individual Therapy  [name], [place] 06/03/2013, 4:01 PM

## 2013-06-03 NOTE — Progress Notes (Signed)
ANTIBIOTIC CONSULT NOTE - FOLLOW UP  Pharmacy Consult for Vancomycin Indication: rule out sepsis, rule out wound infection   Allergies  Allergen Reactions  . Morphine And Related Shortness Of Breath, Nausea And Vomiting, Swelling and Other (See Comments)    Agitation, tolerates dilaudid  . Penicillins Hives, Shortness Of Breath and Rash    REACTION: hives, breathing problems  . Cefixime     rash  . Percocet [Oxycodone-Acetaminophen] Anxiety    Takes OxyContin at home.    Patient Measurements: Weight: 126 lb 12.2 oz (57.5 kg) Adjusted Body Weight:   Vital Signs: Temp: 98.2 F (36.8 C) (02/26 0427) Temp src: Oral (02/26 0427) BP: 123/78 mmHg (02/26 0427) Pulse Rate: 70 (02/26 0427) Intake/Output from previous day: 02/25 0701 - 02/26 0700 In: 480 [P.O.:480] Out: 950 [Urine:950] Intake/Output from this shift: Total I/O In: 240 [P.O.:240] Out: 300 [Urine:300]  Labs:  Recent Labs  06/01/13 0450 06/02/13 0545 06/03/13 0515  WBC  --  6.0  --   HGB  --  10.4*  --   PLT  --  307  --   CREATININE 0.46* 0.50 0.44*   The CrCl is unknown because both a height and weight (above a minimum accepted value) are required for this calculation.  Recent Labs  05/31/13 2030  [place] 18.6     Microbiology: Recent Results (from the past 720 hour(s))  CULTURE, BLOOD (SINGLE)     Status: None   Collection Time    05/18/13  4:01 PM      Result Value Ref Range Status   Organism ID, Bacteria NO GROWTH 5 DAYS   Final  CULTURE, BLOOD (SINGLE)     Status: None   Collection Time    05/18/13  4:01 PM      Result Value Ref Range Status   Organism ID, Bacteria NO GROWTH 5 DAYS   Final  CULTURE, BLOOD (ROUTINE X 2)     Status: None   Collection Time    05/23/13  8:21 PM      Result Value Ref Range Status   Specimen Description BLOOD LEFT ARM   Final   Special Requests BOTTLES DRAWN AEROBIC AND ANAEROBIC 10CC EA   Final   Culture  Setup Time     Final   Value: 05/24/2013 01:43      Performed at [facility]   Culture     Final   Value: NO GROWTH 5 DAYS     Performed at [facility]   Report Status 05/30/2013 FINAL   Final  CULTURE, BLOOD (ROUTINE X 2)     Status: None   Collection Time    05/23/13  8:26 PM      Result Value Ref Range Status   Specimen Description BLOOD LEFT HAND   Final   Special Requests BOTTLES DRAWN AEROBIC ONLY 10CC   Final   Culture  Setup Time     Final   Value: 05/24/2013 01:43     Performed at [facility]   Culture     Final   Value: NO GROWTH 5 DAYS     Performed at [facility]   Report Status 05/30/2013 FINAL   Final  URINE CULTURE     Status: None   Collection Time    05/23/13  8:43 PM      Result Value Ref Range Status   Specimen Description URINE, RANDOM   Final   Special Requests NONE   Final  Culture  Setup Time     Final   Value: 05/23/2013 21:44     Performed at [place]     Final   Value: 25,000 COLONIES/ML     Performed at [facility]   Culture     Final   Value: Multiple bacterial morphotypes present, none predominant. Suggest appropriate recollection if clinically indicated.     Performed at [facility]   Report Status 05/24/2013 FINAL   Final  WOUND CULTURE     Status: None   Collection Time    05/30/13 10:00 AM      Result Value Ref Range Status   Specimen Description WOUND   Final   Special Requests BACK INCISION   Final   Gram Stain     Final   Value: RARE WBC PRESENT, PREDOMINANTLY PMN     RARE SQUAMOUS EPITHELIAL CELLS PRESENT     NO ORGANISMS SEEN     Performed at [facility]   Culture     Final   Value: NO GROWTH 2 DAYS     Performed at [facility]   Report Status 06/02/2013 FINAL   Final  CULTURE, BLOOD (ROUTINE X 2)     Status: None   Collection Time    05/30/13 10:15 AM      Result Value Ref Range Status   Specimen Description Blood   Final   Special Requests NONE   Final   Culture  Setup  Time     Final   Value: 05/30/2013 18:49     Performed at [facility]   Culture     Final   Value:        BLOOD CULTURE RECEIVED NO GROWTH TO DATE CULTURE WILL BE HELD FOR 5 DAYS BEFORE ISSUING A FINAL NEGATIVE REPORT     Performed at [facility]   Report Status PENDING   Incomplete  CULTURE, BLOOD (ROUTINE X 2)     Status: None   Collection Time    05/30/13 10:30 AM      Result Value Ref Range Status   Specimen Description Blood   Final   Special Requests NONE   Final   Culture  Setup Time     Final   Value: 05/30/2013 18:49     Performed at [facility]   Culture     Final   Value:        BLOOD CULTURE RECEIVED NO GROWTH TO DATE CULTURE WILL BE HELD FOR 5 DAYS BEFORE ISSUING A FINAL NEGATIVE REPORT     Performed at [facility]   Report Status PENDING   Incomplete  URINE CULTURE     Status: None   Collection Time    05/30/13 12:00 PM      Result Value Ref Range Status   Specimen Description URINE, CATHETERIZED   Final   Special Requests NONE   Final   Culture  Setup Time     Final   Value: 05/30/2013 18:57     Performed at [place]     Final   Value: NO GROWTH     Performed at [facility]   Culture     Final   Value: NO GROWTH     Performed at [facility]   Report Status 05/31/2013 FINAL   Final  CULTURE, EXPECTORATED SPUTUM-ASSESSMENT     Status: None  Collection Time    05/31/13  3:55 PM      Result Value Ref Range Status   Specimen Description SPUTUM   Final   Special Requests Immunocompromised   Final   Sputum evaluation     Final   Value: THIS SPECIMEN IS ACCEPTABLE. RESPIRATORY CULTURE REPORT TO FOLLOW.   Report Status 05/31/2013 FINAL   Final  CULTURE, RESPIRATORY (NON-EXPECTORATED)     Status: None   Collection Time    05/31/13  3:55 PM      Result Value Ref Range Status   Specimen Description SPUTUM   Final   Special Requests NONE   Final   Gram Stain     Final   Value: FEW  WBC PRESENT,BOTH PMN AND MONONUCLEAR     NO SQUAMOUS EPITHELIAL CELLS SEEN     NO ORGANISMS SEEN     Performed at [facility]   Culture     Final   Value: NO GROWTH 2 DAYS     Performed at [facility]   Report Status 06/02/2013 FINAL   Final    Anti-infectives   Start     Dose/Rate Route Frequency Ordered Stop   05/31/13 2200  levofloxacin (LEVAQUIN) tablet 500 mg     500 mg Oral Daily at 10 pm 05/31/13 2037     05/30/13 2100  demeclocycline (DECLOMYCIN) tablet 300 mg  Status:  Discontinued     300 mg Oral Every 12 hours 05/30/13 1821 06/02/13 0821   05/30/13 0900  vancomycin (VANCOCIN) IVPB 750 mg/150 ml premix     750 mg 150 mL/hr over 60 Minutes Intravenous 3 times per day 05/30/13 0807        Assessment: 68yom on Vancomycin Day#5 for r/o lumbar wound infection. Plan for IR drainage of fluid collection. Afeb. WBC wnl.   2/22 Blood x 2: ngtd 2/22 Wound: NEG 2/22 Urine: NEG  Vanc 2/22 >>  Goal of Therapy:  Vancomycin trough level 15-20 mcg/ml  Plan:  1. Continue Vancomycin 764m IV q8h. 2. Monitor renal function, cultures, clinical course, LOT 3. Will check trough weekly if continues   CSherlon Handing PharmD, BCPS Clinical pharmacist, pager [phone]/26/2015,2:58 PM

## 2013-06-04 ENCOUNTER — Inpatient Hospital Stay (HOSPITAL_COMMUNITY): Payer: Medicare Other | Admitting: Speech Pathology

## 2013-06-04 ENCOUNTER — Ambulatory Visit (HOSPITAL_COMMUNITY): Payer: Medicare Other

## 2013-06-04 ENCOUNTER — Encounter (HOSPITAL_COMMUNITY): Payer: Medicare Other | Admitting: Occupational Therapy

## 2013-06-04 ENCOUNTER — Inpatient Hospital Stay (HOSPITAL_COMMUNITY): Payer: Medicare Other | Admitting: Occupational Therapy

## 2013-06-04 LAB — BASIC METABOLIC PANEL
BUN: 5 mg/dL — ABNORMAL LOW (ref 6–23)
CO2: 28 mEq/L (ref 19–32)
Calcium: 8.3 mg/dL — ABNORMAL LOW (ref 8.4–10.5)
Chloride: 94 mEq/L — ABNORMAL LOW (ref 96–112)
Creatinine, Ser: 0.51 mg/dL (ref 0.50–1.35)
GFR calc Af Amer: >90 mL/min (ref >90)
GFR calc non Af Amer: >90 mL/min (ref >90)
Glucose, Bld: 122 mg/dL — ABNORMAL HIGH (ref 70–99)
Potassium: 3.4 mEq/L — ABNORMAL LOW (ref 3.7–5.3)
Sodium: 130 mEq/L — ABNORMAL LOW (ref 137–147)

## 2013-06-04 NOTE — Progress Notes (Signed)
Physical Therapy Session Note  Patient Details  Name: [name] MRN: [id number] Date of Birth: 12-09-1945  Today's Date: 06/04/2013 Time: 2119-4174 Time Calculation (min): 57 min  Short Term Goals: Week 1:  PT Short Term Goal 1 (Week 1): =LTG PT Short Term Goal 1 - Progress (Week 1): Progressing toward goal ((pt has met 2 of 7 LTG's))  Skilled Therapeutic Interventions/Progress Updates:  Pt participated in a walking group this afternoon with focus on stepping over obstacles, performing gait on a mat (in order to improve ability to perform gait on various surfaces), and weaving between cones and chairs with close S and the use of a RW in order to improve pt's functional independence. Session also focused on balance activities which included  performing ball tosses (required Min A without UE support) and ball kicks (required close S with the use of a RW) in order to help improve pt's dynamic standing balance. Session concluded with pt performing bed mobility with Mod I and stand pivot transfers with S (pt needing cueing in order to go from stand to sit with proper LE eccentric control).  Therapy Documentation Precautions:  Precautions Precautions: Back Type of Shoulder Precautions: Pt able to state 3/3 precautions with min verbal cueing Precaution Comments: pt verbalized understanding of 3/3 back precautions Required Braces or Orthoses:  (patient wears spinal brace but no brace ordered) Spinal Brace: Lumbar corset;Applied in sitting position Restrictions Weight Bearing Restrictions: No Pain:  Pt had no c/o pain during the session.   See FIM for current functional status  Therapy/Group: Group Therapy  Tyrece Vanterpool 06/04/2013, 8:59 AM

## 2013-06-04 NOTE — Progress Notes (Signed)
Occupational Therapy Session Note  Patient Details  Name: [name] MRN: [id number] Date of Birth: [birth year]  Today's Date: 06/04/2013 Time: 1130-1157 Time Calculation (min): 27 min  Short Term Goals: Week 1:  OT Short Term Goal 1 (Week 1): Short Term Goals = Long Term Goals  Skilled Therapeutic Interventions/Progress Updates:    Patient seen this am for 1:1 OT to address LE strength and functional mobility as related to daily living skills.  Patient able to don corset at edge of bed without assistance.  Patient ambulated with rolling walker and supervision / cues for step cadence.  Patient able to turn head left/right while walking without loss of balance.  Patient required increased time and cueing although no physical assistance to pull chair away from table and to sit.  Patient unable to stand without extensive  use of upper extremities from a regular height chair.  Practiced controlled descent stand to squat and stand to sit to strengthen bilateral lower extremities while maintaining back precautions.  Therapy Documentation Precautions:  Precautions Precautions: Back Type of Shoulder Precautions: Pt able to state 3/3 precautions with min verbal cueing Precaution Comments: pt verbalized understanding of 3/3 back precautions Required Braces or Orthoses:  (patient wears spinal brace but no brace ordered) Spinal Brace: Lumbar corset;Applied in sitting position Restrictions Weight Bearing Restrictions: No   Vital Signs: Oxygen Therapy O2 Device: None (Room air) Pain: Patient resting in bed with moist heat on low back.  Patient reporting discomfort in back, but very willing to get up for therapy.        See FIM for current functional status  Therapy/Group: Individual Therapy  Mariah Milling 06/04/2013, 11:58 AM

## 2013-06-04 NOTE — Progress Notes (Signed)
Pt's last 'adequate' BM 2/21; has had several small/smears last 2 days with PRN meds, sorbitol, miralax.  Senna increased to 2 BID;  Pt had small BM this am, very mucoid; Abd, soft, non-distended, non-tender; no nausea, vomiting. Appetite poor, pt states normal pattern at home is QD.  Assisted to [facility] and fleets given this eve at 7pm; no stool felt in rectum. No results as of yet, report to eve shift.

## 2013-06-04 NOTE — Progress Notes (Signed)
Physical Therapy Session Note  Patient Details  Name: [name] MRN: [id number] Date of Birth: [birth year]  Today's Date: 06/04/2013 Time: 1530-1600 Time Calculation (min): 30 min  Short Term Goals: Week 1:  PT Short Term Goal 1 (Week 1): =LTG PT Short Term Goal 1 - Progress (Week 1): Progressing toward goal (pt has met 2 of 7 LTG's)  Skilled Therapeutic Interventions/Progress Updates:    Session focused on family education with pt's son and daughter in law (who he is living with upon discharge) and his daughter in attendance. We discussed and I demonstrated proper supervision techniques for when pt is in the home performing gait with the RW, using the stairs (where to stand and where to provide assistance if needed), managing a curb with the RW, performing basic transfers, and performing car transfers with family and pt verbalizing understanding of these techniques. It was emphasized to the family that when the pt is performing functional mobility that they need to be providing close supervision with the family verbalizing understanding of this.  Therapy Documentation Precautions:  Precautions Precautions: Back Type of Shoulder Precautions: Pt able to state 3/3 precautions with min verbal cueing Precaution Comments: pt verbalized understanding of 3/3 back precautions Required Braces or Orthoses:  (patient wears spinal brace but no brace ordered) Spinal Brace: Lumbar corset;Applied in sitting position Restrictions Weight Bearing Restrictions: No Pain:  Pt had no c/o pain during the session.    See FIM for current functional status  Therapy/Group: Individual Therapy  Kathern Lobosco 06/04/2013, 4:10 PM

## 2013-06-04 NOTE — Progress Notes (Signed)
Physical Therapy Weekly Progress Note  Patient Details  Name: [name] MRN: [id number] Date of Birth: 01-Jun-1945  Today's Date: 06/04/2013  Patient has met 2 of 7 long term goals and is progressing towards his other long term goals. Pt is showing good progress and is now  close S with gait in controlled environment (occasionally still needing Min guard with gait in home environment), light Min A with stairs, S with bed mobility, S with basic transfers, and his endurance and bilateral LE strength are improving daily.  Short term goals not set due to estimated short length of stay with pt's stay at inpatient rehab needing to be lengthened due to medical reasons.   Patient continues to demonstrate the following deficits: gait (especially when having to maneuver obstacles and when performing dual task activities), awareness, stairs, bed mobility, basic transfers, car transfers, static and dynamic standing balance, LE strength bilaterally, endurance, and therefore will continue to benefit from skilled PT intervention to enhance overall performance with functional mobility, activity tolerance, balance, functional use of  right upper extremity, right lower extremity, left upper extremity and left lower extremity, awareness, coordination and knowledge of precautions.  See Patient's Care Plan for progression toward long term goals.  Patient progressing toward long term goals..  Continue plan of care.  Skilled Therapeutic Interventions/Progress Updates:  Ambulation/gait training;Balance/vestibular training;Community reintegration;Discharge planning;Disease management/prevention;DME/adaptive equipment instruction;Functional mobility training;Neuromuscular re-education;Pain management;Patient/family education;Skin care/wound management;Stair training;Therapeutic Activities;Therapeutic Exercise;UE/LE Strength taining/ROM;UE/LE Coordination activities;Wheelchair propulsion/positioning   Therapy  Documentation Precautions:  Precautions Precautions: Back Type of Shoulder Precautions: Pt able to state 3/3 precautions with min verbal cueing Precaution Comments: pt verbalized understanding of 3/3 back precautions Required Braces or Orthoses:  (patient wears spinal brace but no brace ordered) Spinal Brace: Lumbar corset;Applied in sitting position Restrictions Weight Bearing Restrictions: No  See FIM for current functional status  Therapy/Group: Individual Therapy  [name] 06/04/2013, 8:23 AM

## 2013-06-04 NOTE — Progress Notes (Signed)
Neuro stable. He feels he is getting better. i did not see any note from radiology in reference to drainage of the seroma. Will call

## 2013-06-04 NOTE — Progress Notes (Signed)
Reviewed and in agreement with treatment provided.  

## 2013-06-04 NOTE — Progress Notes (Signed)
Speech Language Pathology Weekly Progress and Session Note  Patient Details  Name: [name] MRN: [id number] Date of Birth: 05-26-1945  Today's Date: 06/04/2013 Time: 1030-1055 Time Calculation (min): 25 min  Short Term Goals: Week 1: SLP Short Term Goal 1 (Week 1): Patient will perform diaphragmatic breathing and respiratory support and control exercises with minimal cues. SLP Short Term Goal 1 - Progress (Week 1): Met SLP Short Term Goal 2 (Week 1): Patient will perform vocal adduction and sustained phonation exercises with minimal cues. SLP Short Term Goal 2 - Progress (Week 1): Met SLP Short Term Goal 3 (Week 1): SLP will determine benefit from NMES for use in voice treatment. SLP Short Term Goal 3 - Progress (Week 1): Met    New Short Term Goals: Week 2: SLP Short Term Goal 1 (Week 2): Patient will perform diaphragmatic breathing and respiratory support and control exercises with supervision cues. SLP Short Term Goal 2 (Week 2): Patient will perform vocal adduction and sustained phonation exercises with supervision cues.  Weekly Progress Updates: Pt has made functional gains and has met 3 of 3 STG's this admission due to increased ability to utilize diaphragmatic breathing at the word and phrase levels and ability to perform vocal adduction exercises with ability to demonstrate phonation with a hoarse vocal quality. Pt continues to require overall Min A-supervision cueing for tasks at the phrase level. Pt/family education ongoing. Pt would benefit from continued skilled SLP intervention to maximize functional communication prior to discharge.    Intensity: Minumum of 1-2 x/day, 30 to 90 minutes Frequency: 5 out of 7 days Duration/Length of Stay: 06/08/13 Treatment/Interventions: Cueing hierarchy;Therapeutic Activities;Therapeutic Exercise;Patient/family education;Functional tasks;Speech/Language facilitation;Environmental controls;Internal/external aids   Daily  Session Skilled Therapeutic Interventions: Skilled treatment focused on speech goals. SLP facilitated session by providing Min A verbal and visual cues for utilization of diaphragmatic breathing with use of 1 pound weight to increase overall strength and visual feedback. Pt performed vocal function exercises with Min A multimodal cueing and sustained "ah" and "e" for ~5 seconds with a hoarse vocal quality. Continue with current plan of care.    FIM:  Comprehension Comprehension Mode: Auditory Comprehension: 6-Follows complex conversation/direction: With extra time/assistive device Expression Expression Mode: Verbal Expression: 4-Expresses basic 75 - 89% of the time/requires cueing 10 - 24% of the time. Needs helper to occlude trach/needs to repeat words. Social Interaction Social Interaction: 6-Interacts appropriately with others with medication or extra time (anti-anxiety, antidepressant). Problem Solving Problem Solving: 5-Solves complex 90% of the time/cues < 10% of the time Memory Memory: 6-More than reasonable amt of time Pain Reports pain in lower back, RN made aware and pt was premedicated. Pt also repoistioned in bed with heating pad.   Therapy/Group: Individual Therapy  Chayne Baumgart, [place] 06/04/2013, 3:41 PM

## 2013-06-04 NOTE — Progress Notes (Signed)
Occupational Therapy Session Note & Weekly Progress Note   Patient Details  Name: CLESTON LAUTNER MRN: [id number] Date of Birth: 1945/10/16  Today's Date: 06/04/2013  SESSION NOTE 0905-1000 - 37 Minutes Individual Therapy No complaints of pain Upon entering room, patient found seated in w/c finishing breakfast. Patient discussed possible interest in going to a SNF prior to discharging > home; notified SW. Therapist assisted patient > sink side and patient completed grooming tasks & UB/LB bathing and dressing tasks. Therapist administered patient long handled sponge to increase independence with LB bathing. Patient takes more than a reasonable amount of time to complete tasks. At end of session, left patient seated in w/c beside bed with all needed items within reach.   ----------------------------------------------------------------------------------------------------------------  WEEKLY PROGRESS NOTE Patient's STGs=LTGs for previous week secondary to ELOS. Due to medical complications and loss of therapy sessions, patient and therapists felt need for patient to stay a few extra days. Plan for discharge 3/03. No family has been present for education yet. Requesting daughter-in-law come in for education on Monday prior to discharge Tuesday. LTGs set for overall close supervision from an ambulatory level. Patient adheres to back precautions independently during functional tasks.   During am ADL session, patient talked with therapist about going to a SNF prior to going home. Therapist got SW involved, patient with multiple questions.   Patient continues to demonstrate the following deficits: decreased activity tolerance/endurance, decreased dynamic standing balance/tolerance/endurance, decreased independence with BADLs. Therefore, patient will continue to benefit from skilled OT intervention to enhance overall performance with BADL and Reduce care partner burden.  Patient progressing toward long  term goals..  Continue plan of care.  OT Short Term Goals = Long Term Goals  Skilled Therapeutic Interventions/Progress Updates:  Balance/vestibular training;Community reintegration;Discharge planning;DME/adaptive equipment instruction;Functional mobility training;Pain management;Patient/family education;Psychosocial support;Self Care/advanced ADL retraining;Skin care/wound managment;Therapeutic Activities;Therapeutic Exercise;UE/LE Strength taining/ROM;UE/LE Coordination activities;Wheelchair propulsion/positioning   Precautions:  Precautions Precautions: Back Type of Shoulder Precautions: Pt able to state 3/3 precautions with min verbal cueing Precaution Comments: pt verbalized understanding of 3/3 back precautions Required Braces or Orthoses:  (patient wears spinal brace but no brace ordered) Spinal Brace: Lumbar corset;Applied in sitting position Restrictions Weight Bearing Restrictions: No  See FIM for current functional status  Tyjah Hai 06/04/2013, 7:46 AM

## 2013-06-04 NOTE — Progress Notes (Signed)
Reviewed and in agreement with assesement provided.

## 2013-06-04 NOTE — Progress Notes (Signed)
Subjective/Complaints: No neck pain last noc A 12 point review of systems has been performed and if not noted above is otherwise negative.  Review of Systems - Negative except 2 episodes of shooting pain neck and back area Objective: Vital Signs: Blood pressure 126/74, pulse 77, temperature 98.1 F (36.7 C), temperature source Oral, resp. rate 16, weight 56.4 kg (124 lb 5.4 oz), SpO2 97.00%. No results found.  Recent Labs  06/02/13 0545  WBC 6.0  HGB 10.4*  HCT 30.0*  PLT 307    Recent Labs  06/03/13 0515 06/04/13 0500  NA 132* 130*  K 3.3* 3.4*  CL 95* 94*  GLUCOSE 94 122*  BUN 5* 5*  CREATININE 0.44* 0.51  CALCIUM 8.0* 8.3*   CBG (last 3)  No results found for this basename: GLUCAP,  in the last 72 hours  Wt Readings from Last 3 Encounters:  06/04/13 56.4 kg (124 lb 5.4 oz)  05/27/13 61.75 kg (136 lb 2.1 oz)  05/18/13 58.968 kg (130 lb)    Physical Exam:  HENT: oral mucosa pink and moist, voice dysphonic Head: Normocephalic.  Eyes: EOM are normal.  Neck: Normal range of motion. Neck supple. No thyromegaly present.  Cardiovascular: Normal rate and regular rhythm.  Respiratory: Effort normal and breath sounds normal. No respiratory distress. Cough with upper airway sounds. No rales. GI: Soft. Bowel sounds are normal. He exhibits no distension. Soft. Non tender.   Patient was able to state his name and age. He was a poor medical historian , place is [place]  Skin: still dipahoretic Back incision is clean with granulation tissue noted, lumbar incision approximately 1 cm deep with slight drainage, no odor.  dressing in place  Neuro:  Alert and oriented Psych: pleasant and appropriate  Assessment/Plan: 1. Functional deficits secondary to Lumbar stenosis s/p L1-S1 fusion which require 3+ hours per day of interdisciplinary therapy in a comprehensive inpatient rehab setting. Physiatrist is providing close team supervision and 24 hour management of active  medical problems listed below. Physiatrist and rehab team continue to assess barriers to discharge/monitor patient progress toward functional and medical goals. FIM: FIM - Bathing Bathing Steps Patient Completed: Front perineal area;Right upper leg;Left upper leg;Chest;Right Arm;Left Arm;Abdomen;Buttocks Bathing: 4: Min-Patient completes 8-9 [id number] parts or 75+ percent  FIM - Upper Body Dressing/Undressing Upper body dressing/undressing steps patient completed: Thread/unthread right sleeve of pullover shirt/dresss;Thread/unthread left sleeve of pullover shirt/dress;Pull shirt over trunk Upper body dressing/undressing: 4: Min-Patient completed 75 plus % of tasks FIM - Lower Body Dressing/Undressing Lower body dressing/undressing steps patient completed: Pull underwear up/down;Pull pants up/down;Thread/unthread right underwear leg;Thread/unthread left underwear leg;Thread/unthread right pants leg;Thread/unthread left pants leg;Don/Doff right sock;Don/Doff left sock Lower body dressing/undressing: 5: Supervision: Safety issues/verbal cues  FIM - Toileting Toileting steps completed by patient: Adjust clothing prior to toileting;Performs perineal hygiene;Adjust clothing after toileting Toileting Assistive Devices: Grab bar or rail for support Toileting: 0: Activity did not occur  FIM - TRadio producerDevices: Grab bars Toilet Transfers: 0-Activity did not occur  FIM - BControl and instrumentation engineerDevices: Arm rests;Walker Bed/Chair Transfer: 5: Supine > Sit: Supervision (verbal cues/safety issues);5: Bed > Chair or W/C: Supervision (verbal cues/safety issues)  FIM - Locomotion: Wheelchair Locomotion: Wheelchair: 0: Activity did not occur FIM - Locomotion: Ambulation Locomotion: Ambulation Assistive Devices: WAdministratorAmbulation/Gait Assistance: 4: Min assist Locomotion: Ambulation: 2: Travels 50 - 149 ft with supervision/safety  issues  Comprehension Comprehension Mode: Auditory Comprehension: 6-Follows complex conversation/direction:  With extra time/assistive device  Expression Expression Mode: Verbal Expression: 4-Expresses basic 75 - 89% of the time/requires cueing 10 - 24% of the time. Needs helper to occlude trach/needs to repeat words.  Social Interaction Social Interaction: 6-Interacts appropriately with others with medication or extra time (anti-anxiety, antidepressant).  Problem Solving Problem Solving: 5-Solves complex 90% of the time/cues < 10% of the time  Memory Memory: 6-More than reasonable amt of time Medical Problem List and Plan:  1. 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 Patient with history of multiple back surgeries  2. DVT Prophylaxis/Anticoagulation: Subcutaneous Lovenox. Monitor platelet counts and any signs of bleeding  3. Pain Management: Neurontin 600 mg twice a day, OxyContin sustained-release 10 mg every 12 hours, Dilaudid 4 mg every 8 hours moderate to severe pain   -controlled at present, add anagesic creme and Kpad for neck, no problem last noc 4. Mood/depression. Prozac 40 mg twice a day, Xanax 0.5 mg 3 times a day as needed. Provide emotional support and  5. Neuropsych: This patient is not capable of making decisions on his own behalf.  6. Wound. Followup wound care nurse with dressings to back wound as directed  7. Hoarseness of voice after recent back surgery. Followup ENT Dr. Thera Flake.   normal scope/laryngoscopic exam. Continued followup speech therapy. Voice quality improving slowly 8. Rheumatoid arthritis. Chronic prednisone therapy. hold Humira for now. On 40 mg subcutaneous every 14 days as prior to admission  9. Hypertension. Cozaar 50 mg daily, Lopressor 25 mg twice a day. Dc hctz, clonidine prn.  10. Hyperlipidemia. Zocor.  11. COPD. Spiriva daily, albuterol nebulizer every 2 hours as needed. Check oxygen saturations every  shift  12. Nausea--  -improved but still not eating much, see below 13. 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

## 2013-06-05 ENCOUNTER — Inpatient Hospital Stay (HOSPITAL_COMMUNITY): Payer: Medicare Other | Admitting: Physical Therapy

## 2013-06-05 DIAGNOSIS — I4891 Unspecified atrial fibrillation: Secondary | ICD-10-CM

## 2013-06-05 DIAGNOSIS — M069 Rheumatoid arthritis, unspecified: Secondary | ICD-10-CM

## 2013-06-05 DIAGNOSIS — F1110 Opioid abuse, uncomplicated: Secondary | ICD-10-CM

## 2013-06-05 DIAGNOSIS — M479 Spondylosis, unspecified: Secondary | ICD-10-CM

## 2013-06-05 DIAGNOSIS — R509 Fever, unspecified: Secondary | ICD-10-CM

## 2013-06-05 LAB — CULTURE, BLOOD (ROUTINE X 2)
Culture: NO GROWTH
Culture: NO GROWTH

## 2013-06-05 LAB — BASIC METABOLIC PANEL
BUN: 6 mg/dL (ref 6–23)
CO2: 25 mEq/L (ref 19–32)
Calcium: 8.4 mg/dL (ref 8.4–10.5)
Chloride: 96 mEq/L (ref 96–112)
Creatinine, Ser: 0.46 mg/dL — ABNORMAL LOW (ref 0.50–1.35)
GFR calc Af Amer: >90 mL/min (ref >90)
GFR calc non Af Amer: >90 mL/min (ref >90)
Glucose, Bld: 98 mg/dL (ref 70–99)
Potassium: 4.3 mEq/L (ref 3.7–5.3)
Sodium: 131 mEq/L — ABNORMAL LOW (ref 137–147)

## 2013-06-05 LAB — VANCOMYCIN, TROUGH: Vancomycin Tr: 22 ug/mL — ABNORMAL HIGH (ref 10.0–20.0)

## 2013-06-05 MED ORDER — VANCOMYCIN HCL 500 MG IV SOLR
500.0000 mg | Freq: Three times a day (TID) | INTRAVENOUS | Status: DC
  Administered 2013-06-05 – 2013-06-09 (×13): 500 mg via INTRAVENOUS
  Filled 2013-06-05 (×18): qty 500

## 2013-06-05 MED ORDER — LINACLOTIDE 290 MCG PO CAPS
290.0000 ug | ORAL_CAPSULE | Freq: Every day | ORAL | Status: DC
  Administered 2013-06-05 – 2013-06-10 (×6): 290 ug via ORAL
  Filled 2013-06-05 (×8): qty 1

## 2013-06-05 NOTE — Progress Notes (Signed)
ANTIBIOTIC CONSULT NOTE - FOLLOW UP  Pharmacy Consult for Vancomycin Indication: rule out sepsis, rule out wound infection   Allergies  Allergen Reactions  . Morphine And Related Shortness Of Breath, Nausea And Vomiting, Swelling and Other (See Comments)    Agitation, tolerates dilaudid  . Penicillins Hives, Shortness Of Breath and Rash    REACTION: hives, breathing problems  . Cefixime     rash  . Percocet [Oxycodone-Acetaminophen] Anxiety    Takes OxyContin at home.    Patient Measurements: Weight: 123 lb 3.8 oz (55.9 kg) Adjusted Body Weight:   Vital Signs: Temp: 98.8 F (37.1 C) (02/28 0615) Temp src: Oral (02/28 0615) BP: 124/83 mmHg (02/28 0615) Pulse Rate: 71 (02/28 0615) Intake/Output from previous day: 02/27 0701 - 02/28 0700 In: 837 [P.O.:837] Out: 1125 [Urine:1125] Intake/Output from this shift: Total I/O In: 240 [P.O.:240] Out: 300 [Urine:300]  Labs:  Recent Labs  06/03/13 0515 06/04/13 0500 06/05/13 0535  CREATININE 0.44* 0.51 0.46*   The CrCl is unknown because both a height and weight (above a minimum accepted value) are required for this calculation.  Recent Labs  06/05/13 0535  VANCOTROUGH 22.0*     Microbiology: Recent Results (from the past 720 hour(s))  CULTURE, BLOOD (SINGLE)     Status: None   Collection Time    05/18/13  4:01 PM      Result Value Ref Range Status   Organism ID, Bacteria NO GROWTH 5 DAYS   Final  CULTURE, BLOOD (SINGLE)     Status: None   Collection Time    05/18/13  4:01 PM      Result Value Ref Range Status   Organism ID, Bacteria NO GROWTH 5 DAYS   Final  CULTURE, BLOOD (ROUTINE X 2)     Status: None   Collection Time    05/23/13  8:21 PM      Result Value Ref Range Status   Specimen Description BLOOD LEFT ARM   Final   Special Requests BOTTLES DRAWN AEROBIC AND ANAEROBIC 10CC EA   Final   Culture  Setup Time     Final   Value: 05/24/2013 01:43     Performed at [facility]   Culture     Final    Value: NO GROWTH 5 DAYS     Performed at [facility]   Report Status 05/30/2013 FINAL   Final  CULTURE, BLOOD (ROUTINE X 2)     Status: None   Collection Time    05/23/13  8:26 PM      Result Value Ref Range Status   Specimen Description BLOOD LEFT HAND   Final   Special Requests BOTTLES DRAWN AEROBIC ONLY 10CC   Final   Culture  Setup Time     Final   Value: 05/24/2013 01:43     Performed at [facility]   Culture     Final   Value: NO GROWTH 5 DAYS     Performed at [facility]   Report Status 05/30/2013 FINAL   Final  URINE CULTURE     Status: None   Collection Time    05/23/13  8:43 PM      Result Value Ref Range Status   Specimen Description URINE, RANDOM   Final   Special Requests NONE   Final   Culture  Setup Time     Final   Value: 05/23/2013 21:44     Performed at [facility]  Colony Count     Final   Value: 25,000 COLONIES/ML     Performed at [facility]   Culture     Final   Value: Multiple bacterial morphotypes present, none predominant. Suggest appropriate recollection if clinically indicated.     Performed at [facility]   Report Status 05/24/2013 FINAL   Final  WOUND CULTURE     Status: None   Collection Time    05/30/13 10:00 AM      Result Value Ref Range Status   Specimen Description WOUND   Final   Special Requests BACK INCISION   Final   Gram Stain     Final   Value: RARE WBC PRESENT, PREDOMINANTLY PMN     RARE SQUAMOUS EPITHELIAL CELLS PRESENT     NO ORGANISMS SEEN     Performed at [facility]   Culture     Final   Value: NO GROWTH 2 DAYS     Performed at [facility]   Report Status 06/02/2013 FINAL   Final  CULTURE, BLOOD (ROUTINE X 2)     Status: None   Collection Time    05/30/13 10:15 AM      Result Value Ref Range Status   Specimen Description BLOOD LEFT ANTECUBITAL   Final   Special Requests BOTTLES DRAWN AEROBIC AND ANAEROBIC 10CC   Final   Culture  Setup Time      Final   Value: 05/30/2013 18:49     Performed at [facility]   Culture     Final   Value: NO GROWTH 5 DAYS     Performed at [facility]   Report Status 06/05/2013 FINAL   Final  CULTURE, BLOOD (ROUTINE X 2)     Status: None   Collection Time    05/30/13 10:30 AM      Result Value Ref Range Status   Specimen Description BLOOD LEFT WRIST   Final   Special Requests BOTTLES DRAWN AEROBIC ONLY 10CC   Final   Culture  Setup Time     Final   Value: 05/30/2013 18:49     Performed at [facility]   Culture     Final   Value: NO GROWTH 5 DAYS     Performed at [facility]   Report Status 06/05/2013 FINAL   Final  URINE CULTURE     Status: None   Collection Time    05/30/13 12:00 PM      Result Value Ref Range Status   Specimen Description URINE, CATHETERIZED   Final   Special Requests NONE   Final   Culture  Setup Time     Final   Value: 05/30/2013 18:57     Performed at [place]     Final   Value: NO GROWTH     Performed at [facility]   Culture     Final   Value: NO GROWTH     Performed at [facility]   Report Status 05/31/2013 FINAL   Final  CULTURE, EXPECTORATED SPUTUM-ASSESSMENT     Status: None   Collection Time    05/31/13  3:55 PM      Result Value Ref Range Status   Specimen Description SPUTUM   Final   Special Requests Immunocompromised   Final   Sputum evaluation     Final   Value: THIS SPECIMEN IS ACCEPTABLE. RESPIRATORY CULTURE REPORT  TO FOLLOW.   Report Status 05/31/2013 FINAL   Final  CULTURE, RESPIRATORY (NON-EXPECTORATED)     Status: None   Collection Time    05/31/13  3:55 PM      Result Value Ref Range Status   Specimen Description SPUTUM   Final   Special Requests NONE   Final   Gram Stain     Final   Value: FEW WBC PRESENT,BOTH PMN AND MONONUCLEAR     NO SQUAMOUS EPITHELIAL CELLS SEEN     NO ORGANISMS SEEN     Performed at [facility]   Culture     Final   Value:  NO GROWTH 2 DAYS     Performed at [facility]   Report Status 06/02/2013 FINAL   Final    Anti-infectives   Start     Dose/Rate Route Frequency Ordered Stop   06/05/13 1400  vancomycin (VANCOCIN) 500 mg in sodium chloride 0.9 % 100 mL IVPB     500 mg 100 mL/hr over 60 Minutes Intravenous Every 8 hours 06/05/13 1207     05/31/13 2200  levofloxacin (LEVAQUIN) tablet 500 mg     500 mg Oral Daily at 10 pm 05/31/13 2037     05/30/13 2100  demeclocycline (DECLOMYCIN) tablet 300 mg  Status:  Discontinued     300 mg Oral Every 12 hours 05/30/13 1821 06/02/13 0821   05/30/13 0900  vancomycin (VANCOCIN) IVPB 750 mg/150 ml premix  Status:  Discontinued     750 mg 150 mL/hr over 60 Minutes Intravenous 3 times per day 05/30/13 0807 06/05/13 1207      Assessment: 68yom on Vancomycin Day#7 and levaquin day #6  for r/o lumbar wound infection. Plan for IR drainage of fluid collection. Afeb. WBC wnl. Creat 0.46.   2/22 Blood x 2: NG F 2/22 Wound: NEG 2/22 Urine: NEG  Vanc 2/22 >>  levaquin 2/23>>  2/23 Vanc trough = 18.6 mcg/ml 2/28 vanc trough 22 mcg/ml  He is accumulating vancomycin on dose of 750 mg IV q8h.    Goal of Therapy:  Vancomycin trough level 15-20 mcg/ml  Plan:  1. decrease Vancomycin to 564m IV q8h. 2. Monitor renal function, cultures, clinical course, LOT 3. Will check trough weekly if continues 4. Also continues on levaquin 5018mpo qday. MiEudelia BunchPharm.D. [id number]/28/2015 1:15 PM

## 2013-06-05 NOTE — Progress Notes (Signed)
RN discussed with pharmacist (@1 :45am) about q8h vancomycin dose at 0600. Pt is due to have vanc trough level drawn at 0530 and RN questioned whether to wait for the results to come back before administering 0600 dose. Pharmacist discussed options and was concerned if vanc level is low then pt will be at risk for infection, and decided to go ahead and administer dose after trough level is obtained and will re-adjust next dose as needed. RN in agreement.

## 2013-06-05 NOTE — Progress Notes (Signed)
Physical Therapy Session Note  Patient Details  Name: [name] MRN: [id number] Date of Birth: Jun 27, 1945  Today's Date: 06/05/2013 Time: 1400-1500 Time Calculation (min): 60 min  Therapy Documentation Precautions:  Precautions Precautions: Back Type of Shoulder Precautions: Pt able to state 3/3 precautions with min verbal cueing Precaution Comments: pt verbalized understanding of 3/3 back precautions Required Braces or Orthoses:  (patient wears spinal brace but no brace ordered) Spinal Brace: Lumbar corset;Applied in sitting position Restrictions Weight Bearing Restrictions: No Pain: Pain Assessment Pain Assessment: 0-10 Pain Score: 3  Pain Type: Surgical pain Pain Location: Back Pain Orientation: Left Pain Descriptors / Indicators: Aching Pain Frequency: Constant Pain Onset: Gradual Patients Stated Pain Goal: 3 Pain Intervention(s): Medication (See eMAR)  Walking Group with PT Tech Patient brought to [place] in w/c and had RW with him. LSO was on but needed to be and was re-adjusted. Gait using RW 3 x 100', and 3 x 250' with S/Mod-I.  Patient had 2 episodes of mild imbalance which he was able to self-correct.   Therapy/Group: Group Therapy  Roi Jafari J 06/05/2013, 5:14 PM

## 2013-06-05 NOTE — Progress Notes (Signed)
[name] is a 68 y.o. male [birth year] [id number]  Subjective: C/o constipation. No new problems. Slept well. Feeling OK.  Objective: Vital signs in last 24 hours: Temp:  [97.5 F (36.4 C)-98.8 F (37.1 C)] 98.8 F (37.1 C) (02/28 0615) Pulse Rate:  [71-77] 71 (02/28 0615) Resp:  [16-18] 18 (02/28 0615) BP: (124-132)/(80-86) 124/83 mmHg (02/28 0615) SpO2:  [95 %-97 %] 95 % (02/28 0744) FiO2 (%):  [21 %] 21 % (02/28 0744) Weight:  [123 lb 3.8 oz (55.9 kg)] 123 lb 3.8 oz (55.9 kg) (02/28 0615) Weight change: -1 lb 1.6 oz (-0.5 kg) Last BM Date: 06/04/13  Intake/Output from previous day: 02/27 0701 - 02/28 0700 In: 837 [P.O.:837] Out: 1125 [Urine:1125] Last cbgs: CBG (last 3)  No results found for this basename: GLUCAP,  in the last 72 hours   Physical Exam General: No apparent distress   HEENT: not dry Lungs: Normal effort. Lungs clear to auscultation, no crackles or wheezes. Cardiovascular: Regular rate and rhythm, no edema Abdomen: S/NT/ND; BS(+) Musculoskeletal:  unchanged Neurological: No new neurological deficits Wounds: N/A    Skin: clear  Aging changes Mental state: Alert, oriented, cooperative    Lab Results: BMET    Component Value Date/Time   NA 131* 06/05/2013 0535   K 4.3 06/05/2013 0535   CL 96 06/05/2013 0535   CO2 25 06/05/2013 0535   GLUCOSE 98 06/05/2013 0535   BUN 6 06/05/2013 0535   CREATININE 0.46* 06/05/2013 0535   CALCIUM 8.4 06/05/2013 0535   GFRNONAA >90 06/05/2013 0535   GFRAA >90 06/05/2013 0535   CBC    Component Value Date/Time   WBC 6.0 06/02/2013 0545   RBC 3.67* 06/02/2013 0545   RBC 2.69* 03/24/2013 0805   HGB 10.4* 06/02/2013 0545   HCT 30.0* 06/02/2013 0545   PLT 307 06/02/2013 0545   MCV 81.7 06/02/2013 0545   MCH 28.3 06/02/2013 0545   MCHC 34.7 06/02/2013 0545   RDW 14.6 06/02/2013 0545   LYMPHSABS 1.5 05/23/2013 1435   MONOABS 1.6* 05/23/2013 1435   EOSABS 0.0 05/23/2013 1435   BASOSABS 0.0 05/23/2013 1435     Studies/Results: No results found.  Medications: I have reviewed the patient's current medications.  Assessment/Plan:  1. 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 Patient with history of multiple back surgeries  2. DVT Prophylaxis/Anticoagulation: Subcutaneous Lovenox. Monitor platelet counts and any signs of bleeding  3. Pain Management: Neurontin 600 mg twice a day, OxyContin sustained-release 10 mg every 12 hours, Dilaudid 4 mg every 8 hours moderate to severe pain  -controlled at present, add anagesic creme and Kpad for neck, no problem last noc  4. Mood/depression. Prozac 40 mg twice a day, Xanax 0.5 mg 3 times a day as needed. Provide emotional support and  5. Neuropsych: This patient is not capable of making decisions on his own behalf.  6. Wound. Followup wound care nurse with dressings to back wound as directed  7. Hoarseness of voice after recent back surgery. Followup ENT Dr. Thera Flake. normal scope/laryngoscopic exam. Continued followup speech therapy. Voice quality improving slowly  8. Rheumatoid arthritis. Chronic prednisone therapy. hold Humira for now. On 40 mg subcutaneous every 14 days as prior to admission  9. Hypertension. Cozaar 50 mg daily, Lopressor 25 mg twice a day. Dc hctz, clonidine prn.  10. Hyperlipidemia. Zocor.  11. COPD. Spiriva daily, albuterol nebulizer every 2 hours as needed. Check oxygen saturations every shift  12. Nausea--  -improved but still  not eating much, see below  13. 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 Constipation - see Rx       Length of stay, days: 9  AWalker Kehr, MD 06/05/2013, 10:46 AM

## 2013-06-06 ENCOUNTER — Other Ambulatory Visit: Payer: Self-pay | Admitting: Internal Medicine

## 2013-06-06 DIAGNOSIS — E860 Dehydration: Secondary | ICD-10-CM

## 2013-06-06 DIAGNOSIS — N179 Acute kidney failure, unspecified: Secondary | ICD-10-CM

## 2013-06-06 LAB — BASIC METABOLIC PANEL
BUN: 8 mg/dL (ref 6–23)
CO2: 27 mEq/L (ref 19–32)
Calcium: 8 mg/dL — ABNORMAL LOW (ref 8.4–10.5)
Chloride: 99 mEq/L (ref 96–112)
Creatinine, Ser: 0.55 mg/dL (ref 0.50–1.35)
GFR calc Af Amer: >90 mL/min (ref >90)
GFR calc non Af Amer: >90 mL/min (ref >90)
Glucose, Bld: 110 mg/dL — ABNORMAL HIGH (ref 70–99)
Potassium: 4.5 mEq/L (ref 3.7–5.3)
Sodium: 135 mEq/L — ABNORMAL LOW (ref 137–147)

## 2013-06-06 MED ORDER — LACTULOSE 10 GM/15ML PO SOLN
30.0000 g | Freq: Two times a day (BID) | ORAL | Status: DC | PRN
  Filled 2013-06-06: qty 90

## 2013-06-06 NOTE — Progress Notes (Signed)
[name] is a 68 y.o. male 1946/03/11 [id number]  Subjective: C/o constipation - not bette; no stool x 5d. No new problems. Slept well. Feeling OK.  Objective: Vital signs in last 24 hours: Temp:  [98.2 F (36.8 C)] 98.2 F (36.8 C) (03/01 0519) Pulse Rate:  [69-88] 69 (03/01 0519) Resp:  [16] 16 (03/01 0519) BP: (120-128)/(79-84) 120/79 mmHg (03/01 0519) SpO2:  [96 %] 96 % (03/01 0808) FiO2 (%):  [21 %] 21 % (03/01 0808) Weight:  [128 lb 1.4 oz (58.1 kg)] 128 lb 1.4 oz (58.1 kg) (03/01 0649) Weight change: 4 lb 13.6 oz (2.2 kg) Last BM Date: 06/04/13  Intake/Output from previous day: 02/28 0701 - 03/01 0700 In: 480 [P.O.:480] Out: 1425 [Urine:1425] Last cbgs: CBG (last 3)  No results found for this basename: GLUCAP,  in the last 72 hours   Physical Exam General: No apparent distress   HEENT: not dry Lungs: Normal effort. Lungs clear to auscultation, no crackles or wheezes. Cardiovascular: Regular rate and rhythm, no edema Abdomen: S/NT/ND; BS(+) Musculoskeletal:  unchanged Neurological: No new neurological deficits Wounds: N/A    Skin: clear  Aging changes Mental state: Alert, oriented, cooperative    Lab Results: BMET    Component Value Date/Time   NA 135* 06/06/2013 0505   K 4.5 06/06/2013 0505   CL 99 06/06/2013 0505   CO2 27 06/06/2013 0505   GLUCOSE 110* 06/06/2013 0505   BUN 8 06/06/2013 0505   CREATININE 0.55 06/06/2013 0505   CALCIUM 8.0* 06/06/2013 0505   GFRNONAA >90 06/06/2013 0505   GFRAA >90 06/06/2013 0505   CBC    Component Value Date/Time   WBC 6.0 06/02/2013 0545   RBC 3.67* 06/02/2013 0545   RBC 2.69* 03/24/2013 0805   HGB 10.4* 06/02/2013 0545   HCT 30.0* 06/02/2013 0545   PLT 307 06/02/2013 0545   MCV 81.7 06/02/2013 0545   MCH 28.3 06/02/2013 0545   MCHC 34.7 06/02/2013 0545   RDW 14.6 06/02/2013 0545   LYMPHSABS 1.5 05/23/2013 1435   MONOABS 1.6* 05/23/2013 1435   EOSABS 0.0 05/23/2013 1435   BASOSABS 0.0 05/23/2013 1435     Studies/Results: No results found.  Medications: I have reviewed the patient's current medications.  Assessment/Plan:  1. 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 Patient with history of multiple back surgeries  2. DVT Prophylaxis/Anticoagulation: Subcutaneous Lovenox. Monitor platelet counts and any signs of bleeding  3. Pain Management: Neurontin 600 mg twice a day, OxyContin sustained-release 10 mg every 12 hours, Dilaudid 4 mg every 8 hours moderate to severe pain  -controlled at present, add anagesic creme and Kpad for neck, no problem last noc  4. Mood/depression. Prozac 40 mg twice a day, Xanax 0.5 mg 3 times a day as needed. Provide emotional support and  5. Neuropsych: This patient is not capable of making decisions on his own behalf.  6. Wound. Followup wound care nurse with dressings to back wound as directed  7. Hoarseness of voice after recent back surgery. Followup ENT Dr. Thera Flake. normal scope/laryngoscopic exam. Continued followup speech therapy. Voice quality improving slowly  8. Rheumatoid arthritis. Chronic prednisone therapy. hold Humira for now. On 40 mg subcutaneous every 14 days as prior to admission  9. Hypertension. Cozaar 50 mg daily, Lopressor 25 mg twice a day. Dc hctz, clonidine prn.  10. Hyperlipidemia. Zocor.  11. COPD. Spiriva daily, albuterol nebulizer every 2 hours as needed. Check oxygen saturations every shift  12. Nausea--  -  improved but still not eating much, see below  13. 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 Constipation - see Rx. Added Lactulose. Consider Nulytely        Length of stay, days: 1[place], MD 06/06/2013,  3:29 PM

## 2013-06-07 ENCOUNTER — Inpatient Hospital Stay (HOSPITAL_COMMUNITY): Payer: Medicare Other

## 2013-06-07 ENCOUNTER — Ambulatory Visit: Payer: Medicare Other | Admitting: Physical Therapy

## 2013-06-07 ENCOUNTER — Encounter (HOSPITAL_COMMUNITY): Payer: Medicare Other | Admitting: Occupational Therapy

## 2013-06-07 ENCOUNTER — Encounter (HOSPITAL_COMMUNITY): Payer: Self-pay | Admitting: Radiology

## 2013-06-07 ENCOUNTER — Inpatient Hospital Stay (HOSPITAL_COMMUNITY): Payer: Medicare Other | Admitting: Occupational Therapy

## 2013-06-07 DIAGNOSIS — R5381 Other malaise: Secondary | ICD-10-CM

## 2013-06-07 LAB — BASIC METABOLIC PANEL
BUN: 8 mg/dL (ref 6–23)
CO2: 26 mEq/L (ref 19–32)
Calcium: 8.6 mg/dL (ref 8.4–10.5)
Chloride: 97 mEq/L (ref 96–112)
Creatinine, Ser: 0.63 mg/dL (ref 0.50–1.35)
GFR calc Af Amer: >90 mL/min (ref >90)
GFR calc non Af Amer: >90 mL/min (ref >90)
Glucose, Bld: 93 mg/dL (ref 70–99)
Potassium: 5.1 mEq/L (ref 3.7–5.3)
Sodium: 132 mEq/L — ABNORMAL LOW (ref 137–147)

## 2013-06-07 MED ORDER — SORBITOL 70 % SOLN
60.0000 mL | Freq: Once | Status: AC
  Administered 2013-06-07: 60 mL via ORAL
  Filled 2013-06-07 (×2): qty 60

## 2013-06-07 MED ORDER — ENOXAPARIN SODIUM 40 MG/0.4ML ~~LOC~~ SOLN
40.0000 mg | SUBCUTANEOUS | Status: DC
  Administered 2013-06-09 – 2013-06-10 (×2): 40 mg via SUBCUTANEOUS
  Filled 2013-06-07 (×3): qty 0.4

## 2013-06-07 NOTE — Progress Notes (Signed)
Reviewed and in agreement with treatment provided.  

## 2013-06-07 NOTE — Progress Notes (Signed)
Subjective/Complaints: Hasn't had a substantial bm for a few days, small one yesterday A 12 point review of systems has been performed and if not noted above is otherwise negative.  Review of Systems -  Objective: Vital Signs: Blood pressure 120/75, pulse 74, temperature 98 F (36.7 C), temperature source Oral, resp. rate 16, weight 57.2 kg (126 lb 1.7 oz), SpO2 97.00%. No results found. No results found for this basename: WBC, HGB, HCT, PLT,  in the last 72 hours  Recent Labs  06/06/13 0505 06/07/13 0631  NA 135* 132*  K 4.5 5.1  CL 99 97  GLUCOSE 110* 93  BUN 8 8  CREATININE 0.55 0.63  CALCIUM 8.0* 8.6   CBG (last 3)  No results found for this basename: GLUCAP,  in the last 72 hours  Wt Readings from Last 3 Encounters:  06/07/13 57.2 kg (126 lb 1.7 oz)  05/27/13 61.75 kg (136 lb 2.1 oz)  05/18/13 58.968 kg (130 lb)    Physical Exam:  HENT: oral mucosa pink and moist, voice dysphonic Head: Normocephalic.  Eyes: EOM are normal.  Neck: Normal range of motion. Neck supple. No thyromegaly present.  Cardiovascular: Normal rate and regular rhythm.  Respiratory: Effort normal and breath sounds normal. No respiratory distress. Cough with upper airway sounds. No rales. GI: Soft. Bowel sounds are normal. He exhibits minimal distension. Soft. Non tender.   Patient was able to state his name and age. He was a poor medical historian , place is [place]  Skin: still dipahoretic Back incision is clean with granulation tissue noted, lumbar incision approximately 1 cm deep with slight drainage, no odor.  dressing in place  Neuro:  Alert and oriented Psych: pleasant and appropriate  Assessment/Plan: 1. Functional deficits secondary to Lumbar stenosis s/p L1-S1 fusion which require 3+ hours per day of interdisciplinary therapy in a comprehensive inpatient rehab setting. Physiatrist is providing close team supervision and 24 hour management of active medical problems listed  below. Physiatrist and rehab team continue to assess barriers to discharge/monitor patient progress toward functional and medical goals.  At this point, with seroma issue pending, dc tomorrow is unlikely.   FIM: FIM - Bathing Bathing Steps Patient Completed: Front perineal area;Right upper leg;Left upper leg;Chest;Right Arm;Left Arm;Abdomen;Buttocks Bathing: 4: Min-Patient completes 8-9 32f10 parts or 75+ percent  FIM - Upper Body Dressing/Undressing Upper body dressing/undressing steps patient completed: Thread/unthread right sleeve of pullover shirt/dresss;Thread/unthread left sleeve of pullover shirt/dress;Pull shirt over trunk Upper body dressing/undressing: 4: Min-Patient completed 75 plus % of tasks FIM - Lower Body Dressing/Undressing Lower body dressing/undressing steps patient completed: Pull underwear up/down;Pull pants up/down;Thread/unthread right underwear leg;Thread/unthread left underwear leg;Thread/unthread right pants leg;Thread/unthread left pants leg;Don/Doff right sock;Don/Doff left sock Lower body dressing/undressing: 5: Supervision: Safety issues/verbal cues  FIM - Toileting Toileting steps completed by patient: Adjust clothing prior to toileting;Performs perineal hygiene;Adjust clothing after toileting Toileting Assistive Devices: Grab bar or rail for support Toileting: 0: Activity did not occur  FIM - TRadio producerDevices: Grab bars Toilet Transfers: 0-Activity did not occur  FIM - BControl and instrumentation engineerDevices: WCopy 6: Supine > Sit: No assist;5: Bed > Chair or W/C: Supervision (verbal cues/safety issues)  FIM - Locomotion: Wheelchair Locomotion: Wheelchair: 0: Activity did not occur FIM - Locomotion: Ambulation Locomotion: Ambulation Assistive Devices: WAdministratorAmbulation/Gait Assistance: 4: Min assist Locomotion: Ambulation: 2: Travels 50 - 149 ft with  supervision/safety issues  Comprehension Comprehension Mode: Auditory  Comprehension: 6-Follows complex conversation/direction: With extra time/assistive device  Expression Expression Mode: Verbal Expression: 6-Expresses complex ideas: With extra time/assistive device  Social Interaction Social Interaction: 6-Interacts appropriately with others with medication or extra time (anti-anxiety, antidepressant).  Problem Solving Problem Solving: 5-Solves complex 90% of the time/cues < 10% of the time  Memory Memory: 6-Assistive device: No helper Medical Problem List and Plan:  1. 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 Patient with history of multiple back surgeries  2. DVT Prophylaxis/Anticoagulation: Subcutaneous Lovenox. Monitor platelet counts and any signs of bleeding  3. Pain Management: Neurontin 600 mg twice a day, OxyContin sustained-release 10 mg every 12 hours, Dilaudid 4 mg every 8 hours moderate to severe pain   -controlled at present, add anagesic creme and Kpad for neck, no problem last noc 4. Mood/depression. Prozac 40 mg twice a day, Xanax 0.5 mg 3 times a day as needed. Provide emotional support and  5. Neuropsych: This patient is not capable of making decisions on his own behalf.  6. Wound. Followup wound care nurse with dressings to back wound as directed  7. Hoarseness of voice after recent back surgery. Followup ENT Dr. Thera Flake.   normal scope/laryngoscopic exam. Continued followup speech therapy. Voice quality improving slowly 8. Rheumatoid arthritis. Chronic prednisone therapy. hold Humira for now. On 40 mg subcutaneous every 14 days as prior to admission  9. Hypertension. Cozaar 50 mg daily, Lopressor 25 mg twice a day. off hctz, clonidine prn.  10. Hyperlipidemia. Zocor.  11. COPD. Spiriva daily, albuterol nebulizer every 2 hours as needed. Check oxygen saturations every shift  12. Nausea--  -improved but still not eating much,  see below 13. 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

## 2013-06-07 NOTE — Progress Notes (Signed)
Physical Therapy Discharge Summary  Patient Details  Name: [name] MRN: [id number] Date of Birth: [birth year]  Today's Date: 06/09/2013  Patient has met 7 of 7 long term goals due to improved activity tolerance, improved balance, increased strength, decreased pain, ability to compensate for deficits, functional use of  right lower extremity and left lower extremity and improved coordination.  Pt has made good progress throughout his stay at inpatient rehab but still has deficits (especially when performing gait with turns and dual task activities) and therefore will be discharged at an ambulatory level with close Supervision and the use of a RW .   Patient's is going to live with his son and daughter in law who are available provide the necessary physical assistance at discharge (daughter in law is does not work and is available to provide 24/7 supervision).  Recommendation:  Patient will benefit from ongoing skilled PT services in home health setting to continue to advance safe functional mobility, address ongoing impairments in gait (especially with dual task activities), transfers, static and dynamic standing balance, stairs, endurance, LE strength bilaterally, and minimize fall risk.  Equipment: No equipment provided (patient already owns RW)  Reasons for discharge: treatment goals met and discharge from hospital  Patient/family agrees with progress made and goals achieved: Yes  PT Discharge Precautions/Restrictions Precautions Precautions: Back;Fall Required Braces or Orthoses:  (Pt wears spinal brace but no brace ordered) Spinal Brace: Lumbar corset;Applied in sitting position Restrictions Weight Bearing Restrictions: No Cognition Overall Cognitive Status: Within Functional Limits for tasks assessed Arousal/Alertness: Awake/alert Orientation Level: Oriented X4 Safety/Judgment: Appears intact Sensation Sensation Light Touch: Appears Intact Motor  Motor Motor: Within  Functional Limits   Trunk/Postural Assessment  Cervical Assessment Cervical Assessment: Within Functional Limits Thoracic Assessment Thoracic Assessment: Within Functional Limits Lumbar Assessment Lumbar Assessment: Exceptions to [facility] (back precautions) Postural Control Postural Control: Within Functional Limits  Balance Balance Balance Assessed: Yes Standardized Balance Assessment Standardized Balance Assessment: Berg Balance Test Berg Balance Test Sit to Stand: Able to stand without using hands and stabilize independently Standing Unsupported: Able to stand 2 minutes with supervision Sitting with Back Unsupported but Feet Supported on Floor or Stool: Able to sit safely and securely 2 minutes Stand to Sit: Controls descent by using hands Transfers: Able to transfer with verbal cueing and /or supervision Standing Unsupported with Eyes Closed: Able to stand 3 seconds Standing Ubsupported with Feet Together: Able to place feet together independently and stand for 1 minute with supervision From Standing, Reach Forward with Outstretched Arm: Reaches forward but needs supervision (back precautions) From Standing Position, Pick up Object from Floor: Able to pick up shoe, needs supervision From Standing Position, Turn to Look Behind Over each Shoulder: Needs supervision when turning (back precautions) Turn 360 Degrees: Needs close supervision or verbal cueing Standing Unsupported, Alternately Place Feet on Step/Stool: Able to complete >2 steps/needs minimal assist Standing Unsupported, One Foot in Front: Needs help to step but can hold 15 seconds Standing on One Leg: Tries to lift leg/unable to hold 3 seconds but remains standing independently Total Score: 30 Dynamic Sitting Balance Dynamic Sitting - Level of Assistance: 6: Modified independent (Device/Increase time) Static Standing Balance Static Standing - Level of Assistance: 5: Stand by assistance Dynamic Standing Balance Dynamic  Standing - Level of Assistance: 5: Stand by assistance Extremity Assessment      RLE Assessment RLE Assessment: Exceptions to [facility] RLE Strength RLE Overall Strength Comments: MMT grades: Hip Flexion 4-/5, Knee Extension 4/5, Ankle Plantarflexion 4+/5,  and Ankle Dorsiflexion 4/5 LLE Assessment LLE Assessment: Exceptions to [facility] LLE Strength LLE Overall Strength Comments: MMT grades: Hip Flexion 4-/5, Knee Extension 4+/5, Ankle Plantarflexion 4+/5, and Ankle Dorsiflexion 4/5  See FIM for current functional status  Hideko Esselman 06/09/2013, 5:23 PM

## 2013-06-07 NOTE — Progress Notes (Signed)
Speech Language Pathology Daily Session Note  Patient Details  Name: [name] MRN: [id number] Date of Birth: 12/30/1945  Today's Date: 06/07/2013 Time: 1500-1520 Time Calculation (min): 20 min  Short Term Goals: Week 2: SLP Short Term Goal 1 (Week 2): Patient will perform diaphragmatic breathing and respiratory support and control exercises with supervision cues. SLP Short Term Goal 2 (Week 2): Patient will perform vocal adduction and sustained phonation exercises with supervision cues.  Skilled Therapeutic Interventions: Skilled treatment session focused on patient/family education in regards to pt's current voice impairment and strategies to utilize to increase vocal hygiene and overall voice quality. Pt's family also taught vocal adduction exercises the patient has been performing during treatment 1-3 times per day. Patient's family verbalized and demonstrated understanding and handouts given to reinforce information.    FIM:  Comprehension Comprehension Mode: Auditory Comprehension: 6-Follows complex conversation/direction: With extra time/assistive device Expression Expression Mode: Verbal Expression: 4-Expresses basic 75 - 89% of the time/requires cueing 10 - 24% of the time. Needs helper to occlude trach/needs to repeat words. Social Interaction Social Interaction: 6-Interacts appropriately with others with medication or extra time (anti-anxiety, antidepressant). Problem Solving Problem Solving: 5-Solves complex 90% of the time/cues < 10% of the time Memory Memory: 6-Assistive device: No helper  Pain No/Denies Pain   Therapy/Group: Individual Therapy  Shadana Pry 06/07/2013, 5:30 PM

## 2013-06-07 NOTE — Progress Notes (Signed)
Physical Therapy Session Note  Patient Details  Name: MOTTY BORIN MRN: [id number] Date of Birth: 1945/10/16  Today's Date: 06/07/2013 Time: 6283-1517 Time Calculation (min): 43 min  Short Term Goals: Week 2:  PT Short Term Goal 1 (Week 2): =LTG  Skilled Therapeutic Interventions/Progress Updates:    Session consisted of pt performing gait from his room to the rehab gym and back at the end of the session with S and the use of a RW (pt with no LOB) in order to improve endurance, performing dynamic balance activities on the Wii (balance games on the Wii Fit and boxing with Wii Sports) with S-Min Guard in order to improve pt's safety at home, and performing the nustep at level 4 x 6 minutes (activity had to be cut short due to c/o low back pain) with the use of just his LE's in order to improve LE strength bilaterally.   Therapy Documentation Precautions:  Precautions Precautions: Back;Fall Type of Shoulder Precautions: Pt able to state 3/3 precautions with min verbal cueing Precaution Comments: pt verbalized understanding of 3/3 back precautions Required Braces or Orthoses:  (Pt wears spinal brace but no brace ordered) Spinal Brace: Lumbar corset;Applied in sitting position Restrictions Weight Bearing Restrictions: No Pain:  Pt had c/o 3/10 back pain when performing the nustep. The nustep activity was ended early due to this pain.    See FIM for current functional status  Therapy/Group: Individual Therapy  Barney Gertsch 06/07/2013, 3:10 PM

## 2013-06-07 NOTE — H&P (Signed)
Chief Complaint: "back pain and fever." Referring Physician: Dr. Kristeen Mans HPI: [name] is an 68 y.o. male who had a L1-S1 fusion 03/02/13 followed by a lumbar wound debridement 03/17/13. The patient states he still is having back pain with low grade fevers and small amount of discharge on wound dressing. Dr. Kristeen Mans has requested IR to perform an image guided lumbar seroma aspiration. MRI from 05/30/13 reviewed with Dr. Estanislado Pandy today. He denies any chest pain or shortness of breath. He denies any active bleeding. He denies any difficulty with previous sedation or any history of sleep apnea.   Past Medical History:  Past Medical History  Diagnosis Date  . Hx of colonic polyps   . Diverticulosis   . Hypertension   . 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 CAD (coronary artery disease)     minimal coronary plaque in the LAD and right coronary system. 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 7-06. Last nuclear stress 11-17-06 fixed anterior/ inferior defect, no inducible ischemia, EF 81%  . Allergic rhinitis   . Hx of colonoscopy   . PONV (postoperative nausea and vomiting)   . Dysrhythmia 01-24-12    past hx. A.Fib x1 episode-responded to med.  . Depression   . Anxiety     pt. feels its related to the medicine that he takes   . GERD (gastroesophageal reflux disease)   . COPD (chronic obstructive pulmonary disease)     pt denies this hx on 05/24/2013  . Osteoarthritis   . RA (rheumatoid arthritis)   . Chronic back pain     Past Surgical History:  Past Surgical History  Procedure Laterality Date  . Appendectomy    . Total knee arthroplasty Right 2002     partial  . Shoulder hemi-arthroplasty Left 2008    partial  . Foot surgery Right 11-08    for removal of bone spurs-  . Ankle reconstruction Right 6-09    [facility]  . Posterior lumbar fusion  12-10    L4-5 diskectomy  w/ fusion, cage placement and rods; [place]  . Partial knee arthroplasty Left   . Total shoulder replacement Right   . Joint replacement    . Cataract extraction, bilateral Bilateral 01-24-12  . Orif shoulder fracture  02/06/2012    Procedure: OPEN REDUCTION INTERNAL FIXATION (ORIF) SHOULDER FRACTURE;  Surgeon: Nita Sells, MD;  Location: [facility];  Service: Orthopedics;  Laterality: Left;  ORIF of a Left Shoulder Fracture with  Iliac Crest Bone Graft aspiration   . Harvest bone graft  02/06/2012    Procedure: HARVEST ILIAC BONE GRAFT;  Surgeon: Nita Sells, MD;  Location: [facility];  Service: Orthopedics;;  bone marrow aspirqation   . Hardware removal  03/09/2012    Procedure: HARDWARE REMOVAL;  Surgeon: Nita Sells, MD;  Location: [facility];  Service: Orthopedics;  Laterality: Left;  Hardware Removal from Left Shoulder  . Shoulder arthroscopy Right 2013  . Carpal tunnel release Right 1990's  . Total ankle replacement Right 2008    at [facility]  . Hammer toe surgery Right 07/2012    "broke 4 hammertoes"   . Posterior lumbar fusion 4 level N/A 03/02/2013    Procedure: Lumbar One to Sacral One Posterior lumbar interbody fusion;  Surgeon: Floyce Stakes, MD;  Location: [place] [facility];  Service: Neurosurgery;  Laterality: N/A;  L1 to S1 Posterior lumbar interbody fusion  . Lumbar wound debridement N/A 03/17/2013    Procedure: Incision and drainage of superficial lumbar wound;  Surgeon: Floyce Stakes, MD;  Location: [place] [facility];  Service: Neurosurgery;  Laterality: N/A;  Incision and drainage of superficial lumbar wound  . Tonsillectomy    . Hernia repair Bilateral   . Coronary angioplasty with stent placement  2006    "while repairing 1st stent, a second area tore and they had to place 2nd stent " ?LAD & CX    Family History:  Family History  Problem Relation Age of Onset  . Uterine cancer Mother     survivor  . Macular degeneration Mother    . Other Mother     ankle edema  . Lung cancer Mother   . Cancer Mother     ovarian, lung  . Coronary artery disease Father   . Hypertension Father   . Prostate cancer Father   . Colon polyps Father   . Cancer Father     prostate  . Thyroid disease Sister   . Heart attack Brother   . Hyperlipidemia Brother   . Other Brother     Schizophrenic  . Diabetes Neg Hx   . Colon cancer Neg Hx   . Esophageal cancer Neg Hx   . Rectal cancer Neg Hx   . Stomach cancer Neg Hx   . Coronary artery disease Maternal Aunt   . Heart attack Maternal Aunt   . Rheum arthritis Sister   . Hemochromatosis Sister     Social History:  reports that he has never smoked. He has never used smokeless tobacco. He reports that he does not drink alcohol or use illicit drugs.  Allergies:  Allergies  Allergen Reactions  . Morphine And Related Shortness Of Breath, Nausea And Vomiting, Swelling and Other (See Comments)    Agitation, tolerates dilaudid  . Penicillins Hives, Shortness Of Breath and Rash    REACTION: hives, breathing problems  . Cefixime     rash  . Percocet [Oxycodone-Acetaminophen] Anxiety    Takes OxyContin at home.    Medications:   Medication List    ASK your doctor about these medications       Aclidinium Bromide 400 MCG/ACT Aepb  Inhale 2 puffs into the lungs at bedtime.     Aclidinium Bromide 400 MCG/ACT Aepb  Commonly known as:  TUDORZA PRESSAIR  Inhale 1 puff into the lungs 2 (two) times daily.     ALPRAZolam 0.5 MG tablet  Commonly known as:  XANAX  Take 1 tablet (0.5 mg total) by mouth 3 (three) times daily as needed for anxiety or sleep.     aspirin EC 81 MG EC tablet  Generic drug:  aspirin  Take 81 mg by mouth every morning.     celecoxib 200 MG capsule  Commonly known as:  CELEBREX  Take 200 mg by mouth 2 (two) times daily.     cholecalciferol 1000 UNITS tablet  Commonly known as:  VITAMIN D  Take 1 tablet (1,000 Units total) by mouth daily.      colchicine 0.6 MG tablet  Take 0.6 mg by mouth 2 (two) times daily.     [employer] MELATONIN 5 MG Tabs  Generic drug:  Melatonin  Take 5 mg by mouth at bedtime.     docusate sodium 100 MG capsule  Commonly known as:  COLACE  Take 1 capsule (100 mg total) by mouth 2 (two) times  daily.     ENSURE COMPLETE PO  Take 0.5 Cans by mouth 2 (two) times daily.     feeding supplement (ENSURE COMPLETE) Liqd  Take 237 mLs by mouth daily.     FLUoxetine 40 MG capsule  Commonly known as:  PROZAC  Take 40 mg by mouth 2 (two) times daily.     fluticasone 50 MCG/ACT nasal spray  Commonly known as:  FLONASE  Place 2 sprays into both nostrils at bedtime.     furosemide 20 MG tablet  Commonly known as:  LASIX  Take 20 mg by mouth daily.     gabapentin 300 MG capsule  Commonly known as:  NEURONTIN  Take 300-600 mg by mouth 4 (four) times daily. 600 mg every morning, 300 mg at noon, 600 mg at bedtime, and 300 mg at 1 AM     hydrochlorothiazide 25 MG tablet  Commonly known as:  HYDRODIURIL  Take 25 mg by mouth daily before breakfast.     HYDROmorphone 4 MG tablet  Commonly known as:  DILAUDID  Take 1 tablet (4 mg total) by mouth every 8 (eight) hours as needed for moderate pain or severe pain. MAY FILL ON OR AFTER 07/16/13     losartan 50 MG tablet  Commonly known as:  COZAAR  Take 50 mg by mouth daily.     OXYCONTIN 10 mg T12a 12 hr tablet  Generic drug:  OxyCODONE  Take 10 mg by mouth every 12 (twelve) hours.     pantoprazole 20 MG tablet  Commonly known as:  PROTONIX  TAKE 2 TABLETS BY MOUTH TWICE A DAY     pantoprazole 40 MG tablet  Commonly known as:  PROTONIX  Take 40 mg by mouth 2 (two) times daily.     polyethylene glycol packet  Commonly known as:  MIRALAX / GLYCOLAX  Take 17 g by mouth daily as needed for moderate constipation.     potassium chloride SA 20 MEQ tablet  Commonly known as:  K-DUR,KLOR-CON  Take 20 mEq by mouth daily.     predniSONE 5 MG tablet  Commonly known  as:  DELTASONE  Take 5 mg by mouth every evening.     simvastatin 40 MG tablet  Commonly known as:  ZOCOR  Take 40 mg by mouth daily with lunch.     SYSTANE ULTRA 0.4-0.3 % Soln  Generic drug:  Polyethyl Glycol-Propyl Glycol  Place 2 drops into both eyes 2 (two) times daily as needed (dry eyes).     traZODone 50 MG tablet  Commonly known as:  DESYREL  Take 50 mg by mouth at bedtime as needed for sleep (take if the 100 mg dose is not effective.).       Please HPI for pertinent positives, otherwise complete 10 system ROS negative.  Physical Exam: BP 124/81  Pulse 91  Temp(Src) 98 F (36.7 C) (Oral)  Resp 16  Wt 126 lb 1.7 oz (57.2 kg)  SpO2 94% Body mass index is 20.36 kg/(m^2).  General Appearance:  Alert, cooperative, no distress  Head:  Normocephalic, without obvious abnormality, atraumatic  Neck: Supple, symmetrical, trachea midline  Lungs:   Clear to auscultation bilaterally, no w/r/r, respirations unlabored without use of accessory muscles.  Chest Wall:  No tenderness or deformity  Heart:  Regular rate and rhythm, S1, S2 normal, no murmur, rub or gallop.  Back: Dressing C/D/I lumbar region.   Abdomen:   Soft, non-tender, non distended, (+) BS  Extremities: Extremities normal,  atraumatic, no cyanosis or edema  Pulses: 2+ and symmetric  Neurologic: Normal affect, no gross deficits.   Results for orders placed during the hospital encounter of 05/27/13 (from the past 48 hour(s))  BASIC METABOLIC PANEL     Status: Abnormal   Collection Time    06/06/13  5:05 AM      Result Value Ref Range   Sodium 135 (*) 137 - 147 mEq/L   Potassium 4.5  3.7 - 5.3 mEq/L   Chloride 99  96 - 112 mEq/L   CO2 27  19 - 32 mEq/L   Glucose, Bld 110 (*) 70 - 99 mg/dL   BUN 8  6 - 23 mg/dL   Creatinine, Ser 0.55  0.50 - 1.35 mg/dL   Calcium 8.0 (*) 8.4 - 10.5 mg/dL   GFR calc non Af Amer >90  >90 mL/min   GFR calc Af Amer >90  >90 mL/min   Comment: (NOTE)     The eGFR has been calculated  using the CKD EPI equation.     This calculation has not been validated in all clinical situations.     eGFR's persistently <90 mL/min signify possible Chronic Kidney     Disease.  BASIC METABOLIC PANEL     Status: Abnormal   Collection Time    06/07/13  6:31 AM      Result Value Ref Range   Sodium 132 (*) 137 - 147 mEq/L   Potassium 5.1  3.7 - 5.3 mEq/L   Chloride 97  96 - 112 mEq/L   CO2 26  19 - 32 mEq/L   Glucose, Bld 93  70 - 99 mg/dL   BUN 8  6 - 23 mg/dL   Creatinine, Ser 0.63  0.50 - 1.35 mg/dL   Calcium 8.6  8.4 - 10.5 mg/dL   GFR calc non Af Amer >90  >90 mL/min   GFR calc Af Amer >90  >90 mL/min   Comment: (NOTE)     The eGFR has been calculated using the CKD EPI equation.     This calculation has not been validated in all clinical situations.     eGFR's persistently <90 mL/min signify possible Chronic Kidney     Disease.   No results found.  Assessment/Plan S/p Lumbar 1- Sacral 1 fusion 03/02/13 S/p lumbar wound debridement 03/17/13 MRI 05/30/13 with lumbar seroma, on levaquin and vancomycin. Low grade fevers and back pain Request for image guided lumbar seroma aspiration. Patient will be NPO after midnight, lovenox held 3/3, labs drawn to check in am, images reviewed with Dr. Estanislado Pandy. Risks and Benefits discussed with the patient. All of the patient's questions were answered, patient is agreeable to proceed. Consent signed and in chart.  Tsosie Billing D PA-C 06/07/2013, 1:38 PM

## 2013-06-07 NOTE — Progress Notes (Signed)
Occupational Therapy Session Notes  Patient Details  Name: [name] MRN: [id number] Date of Birth: [birth year]  Today's Date: 06/07/2013  Short Term Goals: Week 1:  OT Short Term Goal 1 (Week 1): Short Term Goals = Long Term Goals  Skilled Therapeutic Interventions/Progress Updates:   Session #1 0930-1030 - 60 Minutes Individual Therapy No complaints of pain Upon entering room, patient found supine in bed. Patient supposed to go home tomorrow, but due to medical complications he is unable tot go home at this time. Patient engaged in bed mobility, sat edge of bed, stood to pull underwear up, then transferred > w/c; patient able to complete all this with supervision and required min verbal cues for safety. From here, patient completed grooming tasks seated at sink and UB/LB bathing & dressing in sit<>stand position at sink. Patient with one LOB episode, but was able to catch self without assistance from therapist. Therapist donned bilateral TEDs and patient donned bilateral shoes with minimal assistance. Patient uses AE intermittently prn to increase independence with LB ADLs. At end of session, left patient seated in w/c with PT present for next session.   Session #2 1430-1500 - 30 Minutes Individual Therapy  No complaints of pain Patient found seated in w/c. Focused skilled intervention on family education > patient's daughter and daughter-in-law. Family education focus on safety with RW, decreasing risk of falls, shower stall transfers, toilet transfers, sit<>stands, and ADLs from sink or shower level. Daughter-in-law, main caregiver, is independent to assist patient appropriately and prn once discharged home. At end of session, left patient seated in w/c with SLP entering room for family education.   Precautions:  Precautions Precautions: Back Type of Shoulder Precautions: Pt able to state 3/3 precautions with min verbal cueing Precaution Comments: pt verbalized understanding of  3/3 back precautions Required Braces or Orthoses:  (Pt wears spinal brace but no brace ordered) Spinal Brace: Lumbar corset;Applied in sitting position Restrictions Weight Bearing Restrictions: No  See FIM for current functional status  Rhylee Pucillo 06/07/2013, 10:23 AM

## 2013-06-07 NOTE — Consult Note (Signed)
NEUROCOGNITIVE STATUS EXAMINATION - [name]   [name] is a 68 year old, right-handed man, who was seen for a neurocognitive status examination to evaluate his emotional state and mental status in the setting of acute encephalopathy and lumbar post-laminectomy syndrome.  According to his medical record, [name] was admitted on 03/02/13 and underwent decompression and fusion of C0-K3, which was complicated by hypotension requiring pressor support.  He was discharged to a skilled nursing facility on 03/10/2013 and returned to the hospital on 03/16/2013 with high fever and wound drainage.  His wound was repaired on 03/17/2013 and he was discharged home with assistance from his significant other.  [name] presented on 05/23/13 with nausea, vomiting, poor appetite, and low grade fever.  Recent CT of the head did not demonstrate acute abnormalities.  Inpatient rehabilitation was recommended given noted deconditioning.   Emotional Functioning:  During the clinical interview, [name] stated that he feels his current situation is the "most painful ordeal I've gone through" and said that he is feeling "down about it."  He also described how the worst ramification of his deteriorating health has been the loss of his romantic partnership.  He spent significant time during the session describing his goals of improving his physical health in order to try and convince his significant other that he has changed so that they can get back together.  Notably, [name] said that his significant other left the relationship, in part, because he was engaging in self-harm behavior (e.g. not being careful and falling frequently).  [name] said that he was doing these things because of "boredom."  He noted that he plans to eat right, exercise and "keep his mind straight" in order to combat boredom after he is discharged from the current hospitalization.  Mr.  [name] was able to identify other coping strategies as well, including using his job skills (focusing on a solution to a problem rather than worrying about the problem) that he will use to help cope with his boredom.  Time was also spent exploring potential obstacles for changing and in attempting to identify signs that he is headed down the same emotional path as before.  Other than feeling "down" at times, [name] generally denied symptoms of depression, including suicidal ideation, and he also denied significant anxious mood.    [name] responses to self-report measures of mood symptoms were not suggestive of the presence of clinically significant depression or anxiety at this time.    Mental Status:  [name] total score on a brief measure of overall mental status was not suggestive of significant cognitive disruption at this time (MMSE-2 = brief = 15/16).  He lost one point for misstating the floor of the building that he was on.  Subjectively, he denied noticing cognitive changes.      Impressions and Recommendations:  [name] overall neurocognitive profile was not suggestive of the presence of significant cognitive impairment at this time.  From an emotional standpoint, he did not endorse symptoms that would be suggestive of clinically significant mood disruption.  However, he described behavioral patterns that are concerning for his physical recovery.  For example, it is worrisome that when he felt bored at home before, he did not talk about it.  Rather, he engaged in self-destructive behaviors.  In addition, it is concerning that his primary motivating factor for recovery is potential resolution with his significant other.  This is concerning because if his relationship does not heal  as he wants it to, he may be devastated to the point that he neglects himself again.  While significant time was spent identifying and exploring coping strategies and in discussing how to best  implement them, it was unclear how much of that conversation [name] absorbed.  His physicians should be on the lookout for any sudden drop in mood and should treat it aggressively, as depressed mood could hinder his physical recovery or even lead to self-harm.  Information on psychological services in his area should also be provided with his discharge paperwork so that he knows where he can seek help if needed post-discharge.    DIAGNOSIS: Acute encephalopathy Lumbar Post-laminectomy syndrome  [name], Psy.D.  Clinical Neuropsychologist

## 2013-06-07 NOTE — Progress Notes (Addendum)
Physical Therapy Session Note  Patient Details  Name: [name] MRN: [id number] Date of Birth: 25-Apr-1945  Today's Date: 06/07/2013 Time: 1030-1128 Time Calculation (min): 58 min  Short Term Goals: Week 2:  PT Short Term Goal 1 (Week 2): =LTG  Skilled Therapeutic Interventions/Progress Updates:    Session consisted of pt performing gait from his room to the rehab gym and back to his room at the end of the session with close S-Min A and the use of a RW (pt with slight LOB on one occasion which required Min A), performing both car stand pivot transfers and basic stand pivot transfers with S and the use of a RW, performing 2 steps (7" steps) x 4 with close S and the use of bilateral handrails (pt able to take the stairs reciprocally), performing bed mobility with Mod I, and performing the berg balance scale test with a total score of 30 (see full results below). Session concluded with patient performing an activity with S-MIn A consisting of gait while maneuvering cones, gait on a mat (in order to improve gait on various surfaces), and stepping over a block in order to improve pt's safety at home  Therapy Documentation Precautions:  Precautions Precautions: Back;Fall Type of Shoulder Precautions: Pt able to state 3/3 precautions with min verbal cueing Precaution Comments: pt verbalized understanding of 3/3 back precautions Required Braces or Orthoses:  (Pt wears spinal brace but no brace ordered) Spinal Brace: Lumbar corset;Applied in sitting position Restrictions Weight Bearing Restrictions: No Pain: Pt c/o 4/10 back pain throughout the session. Pt took rest breaks and was repositioned in his w/c at the end of the session in order to help alleviate this pain.  Balance: Balance Balance Assessed: Yes Standardized Balance Assessment Standardized Balance Assessment: Berg Balance Test Berg Balance Test Sit to Stand: Able to stand without using hands and stabilize  independently Standing Unsupported: Able to stand 2 minutes with supervision Sitting with Back Unsupported but Feet Supported on Floor or Stool: Able to sit safely and securely 2 minutes Stand to Sit: Controls descent by using hands Transfers: Able to transfer with verbal cueing and /or supervision Standing Unsupported with Eyes Closed: Able to stand 3 seconds Standing Ubsupported with Feet Together: Able to place feet together independently and stand for 1 minute with supervision From Standing, Reach Forward with Outstretched Arm: Reaches forward but needs supervision (back precautions) From Standing Position, Pick up Object from Floor: Able to pick up shoe, needs supervision From Standing Position, Turn to Look Behind Over each Shoulder: Needs supervision when turning (back precautions) Turn 360 Degrees: Needs close supervision or verbal cueing Standing Unsupported, Alternately Place Feet on Step/Stool: Able to complete >2 steps/needs minimal assist Standing Unsupported, One Foot in Front: Needs help to step but can hold 15 seconds Standing on One Leg: Tries to lift leg/unable to hold 3 seconds but remains standing independently Total Score: 30 Dynamic Sitting Balance Dynamic Sitting - Level of Assistance: 6: Modified independent (Device/Increase time) Static Standing Balance Static Standing - Level of Assistance: 5: Stand by assistance Dynamic Standing Balance Dynamic Standing - Level of Assistance: 5: Stand by assistance  See FIM for current functional status  Therapy/Group: Individual Therapy  Loy Little 06/07/2013, 12:42 PM

## 2013-06-08 ENCOUNTER — Ambulatory Visit: Payer: Medicare Other | Admitting: Physical Therapy

## 2013-06-08 ENCOUNTER — Inpatient Hospital Stay (HOSPITAL_COMMUNITY): Payer: Medicare Other

## 2013-06-08 ENCOUNTER — Inpatient Hospital Stay (HOSPITAL_COMMUNITY): Payer: Medicare Other | Admitting: Physical Therapy

## 2013-06-08 ENCOUNTER — Inpatient Hospital Stay (HOSPITAL_COMMUNITY): Payer: Medicare Other | Admitting: Speech Pathology

## 2013-06-08 ENCOUNTER — Encounter: Payer: Medicare Other | Admitting: Speech Pathology

## 2013-06-08 ENCOUNTER — Encounter (HOSPITAL_COMMUNITY): Payer: Medicare Other | Admitting: Occupational Therapy

## 2013-06-08 LAB — BASIC METABOLIC PANEL
BUN: 9 mg/dL (ref 6–23)
CO2: 26 mEq/L (ref 19–32)
Calcium: 8.6 mg/dL (ref 8.4–10.5)
Chloride: 99 mEq/L (ref 96–112)
Creatinine, Ser: 0.52 mg/dL (ref 0.50–1.35)
GFR calc Af Amer: >90 mL/min (ref >90)
GFR calc non Af Amer: >90 mL/min (ref >90)
Glucose, Bld: 124 mg/dL — ABNORMAL HIGH (ref 70–99)
Potassium: 4.8 mEq/L (ref 3.7–5.3)
Sodium: 134 mEq/L — ABNORMAL LOW (ref 137–147)

## 2013-06-08 LAB — PROTIME-INR
INR: 1.26 (ref 0.00–1.49)
Prothrombin Time: 15.5 seconds — ABNORMAL HIGH (ref 11.6–15.2)

## 2013-06-08 LAB — CBC
HCT: 31.5 % — ABNORMAL LOW (ref 39.0–52.0)
Hemoglobin: 10.5 g/dL — ABNORMAL LOW (ref 13.0–17.0)
MCH: 28.2 pg (ref 26.0–34.0)
MCHC: 33.3 g/dL (ref 30.0–36.0)
MCV: 84.7 fL (ref 78.0–100.0)
Platelets: 316 10*3/uL (ref 150–400)
RBC: 3.72 MIL/uL — ABNORMAL LOW (ref 4.22–5.81)
RDW: 15.7 % — ABNORMAL HIGH (ref 11.5–15.5)
WBC: 8.1 10*3/uL (ref 4.0–10.5)

## 2013-06-08 LAB — VANCOMYCIN, TROUGH: Vancomycin Tr: 15.3 ug/mL (ref 10.0–20.0)

## 2013-06-08 MED ORDER — HYDROMORPHONE HCL PF 1 MG/ML IJ SOLN
INTRAMUSCULAR | Status: AC
  Filled 2013-06-08: qty 3

## 2013-06-08 MED ORDER — MIDAZOLAM HCL 2 MG/2ML IJ SOLN
INTRAMUSCULAR | Status: AC | PRN
  Administered 2013-06-08: 1 mg via INTRAVENOUS

## 2013-06-08 MED ORDER — FENTANYL CITRATE 0.05 MG/ML IJ SOLN
INTRAMUSCULAR | Status: AC | PRN
  Administered 2013-06-08 (×2): 25 ug via INTRAVENOUS

## 2013-06-08 MED ORDER — MIDAZOLAM HCL 2 MG/2ML IJ SOLN
INTRAMUSCULAR | Status: AC
  Filled 2013-06-08: qty 4

## 2013-06-08 MED ORDER — SODIUM CHLORIDE 0.9 % IV SOLN: INTRAVENOUS | Status: AC

## 2013-06-08 MED ORDER — HYDROMORPHONE HCL PF 1 MG/ML IJ SOLN
INTRAMUSCULAR | Status: AC | PRN
  Administered 2013-06-08: 1 mg

## 2013-06-08 MED ORDER — FENTANYL CITRATE 0.05 MG/ML IJ SOLN
INTRAMUSCULAR | Status: AC
  Filled 2013-06-08: qty 4

## 2013-06-08 NOTE — Progress Notes (Signed)
Social Work Patient ID: [name], male   DOB: [birth year], 68 y.o.   MRN: [id number]  Met with pt and spoke with son via phone to review team conference info.  Both aware and agreeable with targeted d/c on Thursday dependent on medical clearance.  Continue to plan for pt to d/c home with son.   Will begin with [place] f/u.     Trampas Stettner, LCSW

## 2013-06-08 NOTE — Progress Notes (Signed)
Occupational Therapy Note  Patient Details  Name: [name] MRN: [id number] Date of Birth: 1945-07-01 Today's Date: 06/08/2013  Time: 1130-1200 Pt denies pain Individual Therapy  Pt resting in w/c upon arrival.  Pt agreeable to participating in therapy while waiting for procedure.  Pt stated he really wanted something to drink but understood that he couldn't until after procedure.  Pt engaged in dynamic standing tasks while standing on compliant surface.  Pt engaged in BUE tasks with BLE parallel and staggered.  Pt stated that he could feel his legs constantly working and that it was more challenging when they were staggered.     Leotis Shames [facility] 06/08/2013, 12:10 PM

## 2013-06-08 NOTE — Progress Notes (Signed)
Physical Therapy Session Note  Patient Details  Name: [name] MRN: [id number] Date of Birth: [birth year]  Today's Date: 06/08/2013 Time: 7282-0601 Time Calculation (min): 25 min  Short Term Goals: Week 1:  PT Short Term Goal 1 (Week 1): =LTG PT Short Term Goal 1 - Progress (Week 1): Progressing toward goal ((pt has met 2 of 7 LTG's))  Skilled Therapeutic Interventions/Progress Updates:    Dynamic balance during ambulation (horizontal/vertical/diagonal head movements, speed changes, fast walking, sudden stops) 2 x 150' with RW supervision.  Stairs x 5 with railing - supervision, ambulation over complient uneven surface to simulate grassy terrain - min assist.  Cues for avoiding quick turns with RW.   Therapy Documentation Precautions:  Precautions Precautions: Back;Fall Type of Shoulder Precautions: Pt able to state and adhere to 3/3 precautions Precaution Comments: pt verbalized understanding of 3/3 back precautions Required Braces or Orthoses: Spinal Brace (no brace ordered, brace worn for comfort) Spinal Brace: Lumbar corset;Applied in sitting position (applied for comfort) Restrictions Weight Bearing Restrictions: No Pain:  no c/o  See FIM for current functional status  Therapy/Group: Individual Therapy  Lahoma Rocker 06/08/2013, 12:29 PM

## 2013-06-08 NOTE — Procedures (Signed)
S/P lumbar  paraspinal  Fluid aspiration using fluoro guidance. Appro 12 cc of thick cloudy blood stained  aspirate obtained.

## 2013-06-08 NOTE — Progress Notes (Signed)
Patient ID: [name], male   DOB: 1945/07/30, 68 y.o.   MRN: [id number] Drainage of fluid collection done by IR. Lab pending

## 2013-06-08 NOTE — Progress Notes (Signed)
Occupational Therapy Discharge Summary  Patient Details  Name: [name] MRN: [id number] Date of Birth: 02-25-1946  Today's Date: 06/08/2013  Patient has met 11 of 11 long term goals due to improved activity tolerance, improved balance, postural control, ability to compensate for deficits, improved attention, improved awareness and improved coordination.  Patient to discharge at overall Supervision level, except requires min assist for LB dressing (donning of bilateral shoes).  Patient's care partner is independent to provide the necessary supervision prn assistance at discharge. Patient's son, daughter-in-law, and daughter have been present for education.   Reasons goals not met: n/a, all goals met at this time.   Recommendation:  Patient will benefit from ongoing skilled OT services in home health setting to continue to advance functional skills in the area of BADL, iADL and Reduce care partner burden.  Equipment: No equipment provided, patient has access to elevated toilet seats and a shower seat once discharged > son and daughter-in-laws house.   Reasons for discharge: treatment goals met and discharge from hospital  Patient/family agrees with progress made and goals achieved: Yes  Precautions/Restrictions  Precautions Precautions: Back;Fall Type of Shoulder Precautions: Pt able to state and adhere to 3/3 precautions Required Braces or Orthoses: Spinal Brace (no brace ordered, brace worn for comfort) Spinal Brace: Lumbar corset;Applied in sitting position (applied for comfort) Restrictions Weight Bearing Restrictions: No  Vital Signs Therapy Vitals Temp: 98.3 F (36.8 C) Temp src: Oral Pulse Rate: 72 Resp: 17 BP: 117/73 mmHg Oxygen Therapy SpO2: 96 % O2 Device: None (Room air)  ADL - See FIM  Vision/Perception  Vision - History Baseline Vision: No visual deficits Patient Visual Report: No change from baseline Vision - Assessment Eye Alignment: Within  Functional Limits Perception Perception: Within Functional Limits Praxis Praxis: Intact   Cognition Overall Cognitive Status: Within Functional Limits for tasks assessed Arousal/Alertness: Awake/alert Orientation Level: Oriented X4 Memory: Appears intact Awareness: Appears intact Problem Solving: Appears intact Safety/Judgment: Appears intact  Sensation Sensation Additional Comments: BUEs appear intact Coordination Gross Motor Movements are Fluid and Coordinated: Yes (patient with h/o arthritis ) Fine Motor Movements are Fluid and Coordinated: Yes (patient with h/o arthritis )  Motor  Motor Motor: Within Functional Limits  Mobility  Bed Mobility Bed Mobility: Rolling Right;Right Sidelying to Sit;Sitting - Scoot to [place] of Bed Rolling Right: 6: Modified independent (Device/Increase time) Rolling Left: 6: Modified independent (Device/Increase time) Right Sidelying to Sit: 6: Modified independent (Device/Increase time) Left Sidelying to Sit: 6: Modified independent (Device/Increase time) Supine to Sit: 5: Supervision Supine to Sit Details: Visual cues/gestures for precautions/safety Sitting - Scoot to Edge of Bed: 6: Modified independent (Device/Increase time) Sit to Supine: 5: Supervision Sit to Supine - Details: Visual cues/gestures for precautions/safety Sit to Sidelying Right: 6: Modified independent (Device/Increase time) Sit to Sidelying Left: 6: Modified independent (Device/Increase time) Scooting to [facility]: 6: Modified independent (Device/Increase time) Transfers Transfers: Sit to Stand;Stand to Sit Sit to Stand: 5: Supervision Sit to Stand Details: Visual cues/gestures for precautions/safety;Verbal cues for precautions/safety Stand to Sit: 5: Supervision Stand to Sit Details (indicate cue type and reason): Visual cues/gestures for precautions/safety;Verbal cues for precautions/safety   Trunk/Postural Assessment  Cervical Assessment Cervical Assessment: Within  Functional Limits Thoracic Assessment Thoracic Assessment: Within Functional Limits Lumbar Assessment Lumbar Assessment: Exceptions to [facility] (back precautions) Postural Control Postural Control: Within Functional Limits   Balance Balance Balance Assessed: Yes Standardized Balance Assessment Standardized Balance Assessment: Berg Balance Test Berg Balance Test Sit to Stand: Able to stand  without using hands and stabilize independently Standing Unsupported: Able to stand 2 minutes with supervision Sitting with Back Unsupported but Feet Supported on Floor or Stool: Able to sit safely and securely 2 minutes Stand to Sit: Controls descent by using hands Transfers: Able to transfer with verbal cueing and /or supervision Standing Unsupported with Eyes Closed: Able to stand 3 seconds Standing Ubsupported with Feet Together: Able to place feet together independently and stand for 1 minute with supervision From Standing, Reach Forward with Outstretched Arm: Reaches forward but needs supervision (back precautions) From Standing Position, Pick up Object from Floor: Able to pick up shoe, needs supervision From Standing Position, Turn to Look Behind Over each Shoulder: Needs supervision when turning Turn 360 Degrees: Needs close supervision or verbal cueing Standing Unsupported, Alternately Place Feet on Step/Stool: Able to complete >2 steps/needs minimal assist Standing Unsupported, One Foot in Front: Needs help to step but can hold 15 seconds Standing on One Leg: Tries to lift leg/unable to hold 3 seconds but remains standing independently Total Score: 30 Dynamic Sitting Balance Dynamic Sitting - Level of Assistance: 6: Modified independent (Device/Increase time) Static Standing Balance Static Standing - Level of Assistance: 5: Stand by assistance Dynamic Standing Balance Dynamic Standing - Level of Assistance: 5: Stand by assistance  Extremity/Trunk Assessment RUE Assessment RUE Assessment:  Within Functional Limits (patient continues to present with decreased AROM due to arthritis and pain ) LUE Assessment LUE Assessment: Exceptions to [facility] LUE AROM (degrees) LUE Overall AROM Comments: limited shoulder AROM, patient only able to flex shoulder ~20* LUE PROM (degrees) LUE Overall PROM Comments: PROM is [facility] LUE Strength LUE Overall Strength Comments: overall strength =4/5  See FIM for current functional status  Kelina Beauchamp 06/08/2013, 8:07 AM

## 2013-06-08 NOTE — Progress Notes (Signed)
Speech Language Pathology Daily Session Note  Patient Details  Name: [name] MRN: [id number] Date of Birth: December 11, 1945  Today's Date: 06/08/2013 Time: 1100-1125 Time Calculation (min): 25 min  Short Term Goals: Week 2: SLP Short Term Goal 1 (Week 2): Patient will perform diaphragmatic breathing and respiratory support and control exercises with supervision cues. SLP Short Term Goal 2 (Week 2): Patient will perform vocal adduction and sustained phonation exercises with supervision cues.  Skilled Therapeutic Interventions: Skilled treatment session focused on speech goals. Upon arrival, pt sitting up in wheelchair awaiting transfer to procedure. Pt had been NPO since midnight and reported he was unable to perform vocal adduction exercises due to xerostomia. Pt participated in functional conversation about today's events but was essentially aphonic, suspect overall decreased vocal intensity was impacted by decreased hydration due to current NPO status. Continue with current plan of care.    FIM:  Comprehension Comprehension Mode: Auditory Comprehension: 6-Follows complex conversation/direction: With extra time/assistive device Expression Expression Mode: Verbal Expression: 5-Expresses complex 90% of the time/cues < 10% of the time Social Interaction Social Interaction: 6-Interacts appropriately with others with medication or extra time (anti-anxiety, antidepressant). Problem Solving Problem Solving: 5-Solves basic 90% of the time/requires cueing < 10% of the time Memory Memory: 6-Assistive device: No helper FIM - Eating Eating Activity: 0: Activity did not occur  Pain No/Denies Pain   Therapy/Group: Individual Therapy  Kalana Yust 06/08/2013, 1:17 PM

## 2013-06-08 NOTE — Patient Care Conference (Signed)
Inpatient RehabilitationTeam Conference and Plan of Care Update Date: 06/08/2013   Time: 2:05 PM    Patient Name: [name]      Medical Record Number: [id number]  Date of Birth: December 06, 1945 Sex: Male         Room/Bed: 4W06C/4W06C-01 Payor Info: Payor: [employer] [place] [employer]ICARE / Plan: BLUE MEDICARE / Product Type: *No Product type* /    Admitting Diagnosis: Deconditioning Laminectomy  Admit Date/Time:  05/27/2013  4:19 PM Admission Comments: No comment available   Primary Diagnosis:  <principal problem not specified> Principal Problem: <principal problem not specified>  Patient Active Problem List   Diagnosis Date Noted  . Lumbar post-laminectomy syndrome 05/27/2013  . Degenerative arthritis of spine 05/26/2013  . Hemochromatosis 05/26/2013  . Wound infection after surgery 05/26/2013  . Normocytic anemia 05/26/2013  . Unintentional weight loss 05/26/2013  . Protein-calorie malnutrition, severe 05/25/2013  . Hyponatremia 05/23/2013  . SIRS (systemic inflammatory response syndrome) 05/23/2013  . Dehydration 05/23/2013  . Fever 05/23/2013  . Chronic narcotic use 05/23/2013  . Opioid overdose 05/06/2013  . Acute respiratory failure with hypoxia 05/06/2013  . COPD (chronic obstructive pulmonary disease) 05/06/2013  . Acute renal failure 05/06/2013  . Acute encephalopathy 05/06/2013  . Frequent falls 05/06/2013  . Encounter for postoperative wound care 05/06/2013  . Atrial fibrillation 08/07/2010  . Rheumatoid arthritis(714.0) 10/18/2008  . DYSLIPIDEMIA 03/30/2007  . PSEUDOGOUT 03/30/2007  . HYPERTENSION 03/30/2007  . CAD 03/30/2007    Expected Discharge Date: Expected Discharge Date: 06/10/13 (possibly)  Team Members Present: Physician leading conference: Dr. Faith Rogue Social Worker Present: John Jupiter, LCSW Nurse Present: John Purl, RN PT Present: [name], PT OT Present: [name], [name], OT;[name], OT SLP  Present: [name], SLP PPS Coordinator present : John Duck, RN, CRRN;[name], PT     Current Status/Progress Goal Weekly Team Focus  Medical   operative site fluid tap today. ?purulent---continue abx  resolve medical issues, ID  see prior, NS/ID assessment and treatment plan   Bowel/Bladder   cont of bowel and bladder on bowel meds; goes to the bathroom, urinal at night  continent bowel and bladder min assist  monitor for constipation; needed prn bowel meds previously   Swallow/Nutrition/ Hydration             ADL's   overall supervision>min assist  overall supervision>min assist (downgraded LB dressing goal)  family education, overall activity tolerance/endurance, dynamic standing balance, d/c planning   Mobility   S gait and basic transfers with RW, S stairs with bilateral handrails, Mod I bed mobility, S static and dynamic standing balance with use of RW  S ambulatory level  gait (especially dual task activities), static and dynamic standing balance, basic transfers, stairs, car transfers, and discharge planning    Communication   Supervision  Supervision  Diaphragmatic breathing exercises, vocal adduction exercises, family education    Safety/Cognition/ Behavioral Observations            Pain   oxycontin ER 10mg  q12h; occasional need for dilaudid 2mg  prn q6h for neck and back pain  pain less than 5/10  assess pain and medicate as needed   Skin   lower back incision with daily dressing change with Aquacel and dry dsg; yeasty red rash on L buttock/hip area  incision heals with no infection and no skin breakdown  change dressing as ordered, assess skin qshift and prn    Rehab Goals Patient on target to meet  rehab goals: Yes *See Care Plan and progress notes for long and short-term goals.  Barriers to Discharge: medical issues    Possible Resolutions to Barriers:  see above    Discharge Planning/Teaching Needs:  Home with son and dtr-in-law and 24/7 supervision  once medically cleared.      Team Discussion:  Fluid on back tapped today - may need abx.  Will need to monitor over the next day.  Need to continue to monitor fluid intake.  Plan to keep with tx tomorrow.  MD to discuss with ID and NS as needed.  Revisions to Treatment Plan:  Extension of LOS due to medical issues.   Continued Need for Acute Rehabilitation Level of Care: The patient requires daily medical management by a physician with specialized training in physical medicine and rehabilitation for the following conditions: Daily direction of a multidisciplinary physical rehabilitation program to ensure safe treatment while eliciting the highest outcome that is of practical value to the patient.: Yes Daily medical management of patient stability for increased activity during participation in an intensive rehabilitation regime.: Yes Daily analysis of laboratory values and/or radiology reports with any subsequent need for medication adjustment of medical intervention for : Neurological problems;Other;Post surgical problems  [name] 06/08/2013, 7:16 PM

## 2013-06-08 NOTE — Progress Notes (Signed)
ANTIBIOTIC CONSULT NOTE - FOLLOW UP  Pharmacy Consult for Vancomycin Indication: rule out sepsis, rule out wound infection   Allergies  Allergen Reactions  . Morphine And Related Shortness Of Breath, Nausea And Vomiting, Swelling and Other (See Comments)    Agitation, tolerates dilaudid  . Penicillins Hives, Shortness Of Breath and Rash    REACTION: hives, breathing problems  . Cefixime     rash  . Percocet [Oxycodone-Acetaminophen] Anxiety    Takes OxyContin at home.    Patient Measurements: Weight: 123 lb 7.3 oz (56 kg) Adjusted Body Weight:   Vital Signs: Temp: 98.2 F (36.8 C) (03/03 1513) Temp src: Oral (03/03 1513) BP: 114/70 mmHg (03/03 1513) Pulse Rate: 67 (03/03 1513) Intake/Output from previous day: 03/02 0701 - 03/03 0700 In: 360 [P.O.:360] Out: 1001 [Urine:200; Stool:801] Intake/Output from this shift: Total I/O In: -  Out: 200 [Urine:200]  Labs:  Recent Labs  06/06/13 0505 06/07/13 0631 06/08/13 0457  WBC  --   --  8.1  HGB  --   --  10.5*  PLT  --   --  316  CREATININE 0.55 0.63 0.52   The CrCl is unknown because both a height and weight (above a minimum accepted value) are required for this calculation.  Recent Labs  06/08/13 1255  [place] 15.3     Microbiology: Recent Results (from the past 720 hour(s))  CULTURE, BLOOD (SINGLE)     Status: None   Collection Time    05/18/13  4:01 PM      Result Value Ref Range Status   Organism ID, Bacteria NO GROWTH 5 DAYS   Final  CULTURE, BLOOD (SINGLE)     Status: None   Collection Time    05/18/13  4:01 PM      Result Value Ref Range Status   Organism ID, Bacteria NO GROWTH 5 DAYS   Final  CULTURE, BLOOD (ROUTINE X 2)     Status: None   Collection Time    05/23/13  8:21 PM      Result Value Ref Range Status   Specimen Description BLOOD LEFT ARM   Final   Special Requests BOTTLES DRAWN AEROBIC AND ANAEROBIC 10CC EA   Final   Culture  Setup Time     Final   Value: 05/24/2013 01:43   Performed at [facility]   Culture     Final   Value: NO GROWTH 5 DAYS     Performed at [facility]   Report Status 05/30/2013 FINAL   Final  CULTURE, BLOOD (ROUTINE X 2)     Status: None   Collection Time    05/23/13  8:26 PM      Result Value Ref Range Status   Specimen Description BLOOD LEFT HAND   Final   Special Requests BOTTLES DRAWN AEROBIC ONLY 10CC   Final   Culture  Setup Time     Final   Value: 05/24/2013 01:43     Performed at [facility]   Culture     Final   Value: NO GROWTH 5 DAYS     Performed at [facility]   Report Status 05/30/2013 FINAL   Final  URINE CULTURE     Status: None   Collection Time    05/23/13  8:43 PM      Result Value Ref Range Status   Specimen Description URINE, RANDOM   Final   Special Requests NONE   Final  Culture  Setup Time     Final   Value: 05/23/2013 21:44     Performed at [place]     Final   Value: 25,000 COLONIES/ML     Performed at [facility]   Culture     Final   Value: Multiple bacterial morphotypes present, none predominant. Suggest appropriate recollection if clinically indicated.     Performed at [facility]   Report Status 05/24/2013 FINAL   Final  WOUND CULTURE     Status: None   Collection Time    05/30/13 10:00 AM      Result Value Ref Range Status   Specimen Description WOUND   Final   Special Requests BACK INCISION   Final   Gram Stain     Final   Value: RARE WBC PRESENT, PREDOMINANTLY PMN     RARE SQUAMOUS EPITHELIAL CELLS PRESENT     NO ORGANISMS SEEN     Performed at [facility]   Culture     Final   Value: NO GROWTH 2 DAYS     Performed at [facility]   Report Status 06/02/2013 FINAL   Final  CULTURE, BLOOD (ROUTINE X 2)     Status: None   Collection Time    05/30/13 10:15 AM      Result Value Ref Range Status   Specimen Description BLOOD LEFT ANTECUBITAL   Final   Special Requests BOTTLES DRAWN  AEROBIC AND ANAEROBIC 10CC   Final   Culture  Setup Time     Final   Value: 05/30/2013 18:49     Performed at [facility]   Culture     Final   Value: NO GROWTH 5 DAYS     Performed at [facility]   Report Status 06/05/2013 FINAL   Final  CULTURE, BLOOD (ROUTINE X 2)     Status: None   Collection Time    05/30/13 10:30 AM      Result Value Ref Range Status   Specimen Description BLOOD LEFT WRIST   Final   Special Requests BOTTLES DRAWN AEROBIC ONLY 10CC   Final   Culture  Setup Time     Final   Value: 05/30/2013 18:49     Performed at [facility]   Culture     Final   Value: NO GROWTH 5 DAYS     Performed at [facility]   Report Status 06/05/2013 FINAL   Final  URINE CULTURE     Status: None   Collection Time    05/30/13 12:00 PM      Result Value Ref Range Status   Specimen Description URINE, CATHETERIZED   Final   Special Requests NONE   Final   Culture  Setup Time     Final   Value: 05/30/2013 18:57     Performed at [place]     Final   Value: NO GROWTH     Performed at [facility]   Culture     Final   Value: NO GROWTH     Performed at [facility]   Report Status 05/31/2013 FINAL   Final  CULTURE, EXPECTORATED SPUTUM-ASSESSMENT     Status: None   Collection Time    05/31/13  3:55 PM      Result Value Ref Range Status   Specimen Description SPUTUM   Final  Special Requests Immunocompromised   Final   Sputum evaluation     Final   Value: THIS SPECIMEN IS ACCEPTABLE. RESPIRATORY CULTURE REPORT TO FOLLOW.   Report Status 05/31/2013 FINAL   Final  CULTURE, RESPIRATORY (NON-EXPECTORATED)     Status: None   Collection Time    05/31/13  3:55 PM      Result Value Ref Range Status   Specimen Description SPUTUM   Final   Special Requests NONE   Final   Gram Stain     Final   Value: FEW WBC PRESENT,BOTH PMN AND MONONUCLEAR     NO SQUAMOUS EPITHELIAL CELLS SEEN     NO ORGANISMS SEEN      Performed at [facility]   Culture     Final   Value: NO GROWTH 2 DAYS     Performed at [facility]   Report Status 06/02/2013 FINAL   Final    Anti-infectives   Start     Dose/Rate Route Frequency Ordered Stop   06/05/13 1400  vancomycin (VANCOCIN) 500 mg in sodium chloride 0.9 % 100 mL IVPB     500 mg 100 mL/hr over 60 Minutes Intravenous Every 8 hours 06/05/13 1207     05/31/13 2200  levofloxacin (LEVAQUIN) tablet 500 mg     500 mg Oral Daily at 10 pm 05/31/13 2037     05/30/13 2100  demeclocycline (DECLOMYCIN) tablet 300 mg  Status:  Discontinued     300 mg Oral Every 12 hours 05/30/13 1821 06/02/13 0821   05/30/13 0900  vancomycin (VANCOCIN) IVPB 750 mg/150 ml premix  Status:  Discontinued     750 mg 150 mL/hr over 60 Minutes Intravenous 3 times per day 05/30/13 0807 06/05/13 1207      Assessment: 68yom on Vancomycin Day#10 and levaquin day #9  for r/o lumbar wound infection. S/p IR drainage of fluid collection, wound culture pending. Afeb. WBC wnl. Creat 0.0.52 has been stable. Repeat vancomycin trough (15.3) low-end therapeutic range on 52m IV Q 8 hrs  2/22 Blood x 2: NG F 2/22 Wound: NEG 2/22 Urine: NEG 3/3 wound culture   Vanc 2/22 >>  levaquin 2/23>>  2/23 Vanc trough = 18.6 mcg/ml 2/28 vanc trough 22 mcg/ml 3/3 Vanc trough = 15.5 on 5060mQ8  He is accumulating vancomycin on dose of 750 mg IV q8h.    Goal of Therapy:  Vancomycin trough level 15-20 mcg/ml  Plan:  1. Continue Vancomycin to 50027mV q8h. 2. Monitor renal function, cultures, clinical course, LOT 3. Will check trough weekly if continues 4. Also continues on levaquin 500m57m qday.  06/08/2013 3:20 PM  Karianna Gusman Maryanna ShapearmD, BCPS  Clinical Pharmacist  Pager: [phone]

## 2013-06-08 NOTE — Progress Notes (Addendum)
1445; Pt returned from ID procedure; alert but drowsy; oriented x 4; denies pain at present, denies nausea;  VSS, dressing CDI. Voided 500cc,  Orders received. Monitor. 1900; dressing remains CDI, VSS. Tolerated regular diet for dinner. Monitor.

## 2013-06-08 NOTE — Progress Notes (Signed)
Reviewed and in agreement with statement provided.

## 2013-06-08 NOTE — Progress Notes (Signed)
Occupational Therapy Session Note  Patient Details  Name: [name] MRN: [id number] Date of Birth: 1945/07/06  Today's Date: 06/08/2013 Time: 1194-1740 Time Calculation (min): 60 min  Short Term Goals: Week 1:  OT Short Term Goal 1 (Week 1): Short Term Goals = Long Term Goals  Skilled Therapeutic Interventions/Progress Updates:  Upon entering room, patient found supine in bed stating he was going down for his procedure this am. Patient with no reports or complaints of pain. Therapist discussed with RN and RN stated patient wouldn't go until "mid day". Therapist notified patient and patient willing to complete ADL. Patient eager to have procedure completed and go home. Patient's son and daughter-in-law present and educated on bed mobility and EOB > w/c transfer.Therapist also educated family on importance of 24/7 supervision at this time and education regarding TED hose/compression stockings. Patient then completed grooming tasks of brushing teeth & shaving seated in w/c at sink and UB/LB bathing & dressing tasks in sit<> stand position at sink. Patient takes more than reasonable amount of time secondary to decreased overall activity tolerance/endurance. At end of session, patient left seated in w/c at sink waiting on PT for next therapy session.   Precautions:  Precautions Precautions: Back;Fall Type of Shoulder Precautions: Pt able to state and adhere to 3/3 precautions Precaution Comments: pt verbalized understanding of 3/3 back precautions Required Braces or Orthoses: Spinal Brace (no brace ordered, brace worn for comfort) Spinal Brace: Lumbar corset;Applied in sitting position (applied for comfort) Restrictions Weight Bearing Restrictions: No  See FIM for current functional status  Therapy/Group: Individual Therapy  Dmiyah Liscano 06/08/2013, 9:56 AM

## 2013-06-08 NOTE — Progress Notes (Signed)
Physical Therapy Note  Patient Details  Name: [name] MRN: [id number] Date of Birth: 09/05/1945 Today's Date: 06/08/2013  Pt unable to be aroused even after multiple attempts due to pt just returning from surgery with RN being notified of this after leaving pt's room. 45 minutes of total therapy time missed  Shamirah Ivan 06/08/2013, 3:41 PM

## 2013-06-08 NOTE — Progress Notes (Signed)
Subjective/Complaints: Pt feeling well. NPO today for aspiration. Moved bowels yesterday A 12 point review of systems has been performed and if not noted above is otherwise negative.  Review of Systems -  Objective: Vital Signs: Blood pressure 117/73, pulse 72, temperature 98.3 F (36.8 C), temperature source Oral, resp. rate 17, weight 56 kg (123 lb 7.3 oz), SpO2 96.00%. No results found.  Recent Labs  06/08/13 0457  WBC 8.1  HGB 10.5*  HCT 31.5*  PLT 316    Recent Labs  06/07/13 0631 06/08/13 0457  NA 132* 134*  K 5.1 4.8  CL 97 99  GLUCOSE 93 124*  BUN 8 9  CREATININE 0.63 0.52  CALCIUM 8.6 8.6   CBG (last 3)  No results found for this basename: GLUCAP,  in the last 72 hours  Wt Readings from Last 3 Encounters:  06/08/13 56 kg (123 lb 7.3 oz)  05/27/13 61.75 kg (136 lb 2.1 oz)  05/18/13 58.968 kg (130 lb)    Physical Exam:  HENT: oral mucosa pink and moist, voice dysphonic Head: Normocephalic.  Eyes: EOM are normal.  Neck: Normal range of motion. Neck supple. No thyromegaly present.  Cardiovascular: Normal rate and regular rhythm.  Respiratory: Effort normal and breath sounds normal. No respiratory distress. Cough with upper airway sounds. No rales. GI: Soft. Bowel sounds are normal. He exhibits minimal distension. Soft. Non tender.   Patient was able to state his name and age. He was a poor medical historian , place is [place]  Skin: still dipahoretic Back incision is clean with granulation tissue noted, lumbar incision approximately 1 cm deep with slight drainage, no odor.  dressing in place  Neuro:  Alert and oriented Psych: pleasant and appropriate  Assessment/Plan: 1. Functional deficits secondary to Lumbar stenosis s/p L1-S1 fusion which require 3+ hours per day of interdisciplinary therapy in a comprehensive inpatient rehab setting. Physiatrist is providing close team supervision and 24 hour management of active medical problems listed  below. Physiatrist and rehab team continue to assess barriers to discharge/monitor patient progress toward functional and medical goals.  Probably can dc home Wednesday or Thursday pending procedure results  today   FIM: FIM - Bathing Bathing Steps Patient Completed: Front perineal area;Right upper leg;Left upper leg;Chest;Right Arm;Left Arm;Abdomen;Buttocks Bathing: 4: Min-Patient completes 8-9 [id number] parts or 75+ percent  FIM - Upper Body Dressing/Undressing Upper body dressing/undressing steps patient completed: Thread/unthread right sleeve of pullover shirt/dresss;Thread/unthread left sleeve of pullover shirt/dress;Pull shirt over trunk Upper body dressing/undressing: 4: Min-Patient completed 75 plus % of tasks FIM - Lower Body Dressing/Undressing Lower body dressing/undressing steps patient completed: Pull underwear up/down;Pull pants up/down;Thread/unthread right underwear leg;Thread/unthread left underwear leg;Thread/unthread right pants leg;Thread/unthread left pants leg;Don/Doff right sock;Don/Doff left sock Lower body dressing/undressing: 5: Supervision: Safety issues/verbal cues  FIM - Toileting Toileting steps completed by patient: Adjust clothing prior to toileting;Performs perineal hygiene;Adjust clothing after toileting Toileting Assistive Devices: Grab bar or rail for support Toileting: 0: Activity did not occur  FIM - TRadio producerDevices: Grab bars Toilet Transfers: 0-Activity did not occur  FIM - BControl and instrumentation engineerDevices: WCopy 6: Supine > Sit: No assist;6: Sit > Supine: No assist;5: Bed > Chair or W/C: Supervision (verbal cues/safety issues);5: Chair or W/C > Bed: Supervision (verbal cues/safety issues)  FIM - Locomotion: Wheelchair Locomotion: Wheelchair: 0: Activity did not occur (no goal set) FIM - Locomotion: Ambulation Locomotion: Ambulation Assistive Devices: WAstronomerAmbulation/Gait  Assistance: 4: Min assist Locomotion: Ambulation: 4: Travels 150 ft or more with minimal assistance (Pt.>75%)  Comprehension Comprehension Mode: Auditory Comprehension: 6-Follows complex conversation/direction: With extra time/assistive device  Expression Expression Mode: Verbal Expression: 6-Expresses complex ideas: With extra time/assistive device  Social Interaction Social Interaction: 6-Interacts appropriately with others with medication or extra time (anti-anxiety, antidepressant).  Problem Solving Problem Solving: 5-Solves basic 90% of the time/requires cueing < 10% of the time  Memory Memory: 6-Assistive device: No helper Medical Problem List and Plan:  1. 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 Patient with history of multiple back surgeries  2. DVT Prophylaxis/Anticoagulation: Subcutaneous Lovenox. Monitor platelet counts and any signs of bleeding  3. Pain Management: Neurontin 600 mg twice a day, OxyContin sustained-release 10 mg every 12 hours, Dilaudid 4 mg every 8 hours moderate to severe pain   -controlled at present, add anagesic creme and Kpad for neck, no problem last noc 4. Mood/depression. Prozac 40 mg twice a day, Xanax 0.5 mg 3 times a day as needed. Provide emotional support and  5. Neuropsych: This patient is not capable of making decisions on his own behalf.  6. Wound. Followup wound care nurse with dressings to back wound as directed  7. Hoarseness of voice after recent back surgery. Followup ENT Dr. Thera Flake.   normal scope/laryngoscopic exam. Continued followup speech therapy. Voice quality improving slowly 8. Rheumatoid arthritis. Chronic prednisone therapy. hold Humira for now. On 40 mg subcutaneous every 14 days as prior to admission  9. Hypertension. Cozaar 50 mg daily, Lopressor 25 mg twice a day. off hctz, clonidine prn.  10. Hyperlipidemia. Zocor.  11. COPD. Spiriva daily, albuterol  nebulizer every 2 hours as needed. Check oxygen saturations every shift  12. Nausea--  -improved but still not eating much, see below 13. 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

## 2013-06-09 ENCOUNTER — Encounter (HOSPITAL_COMMUNITY): Payer: Medicare Other | Admitting: Occupational Therapy

## 2013-06-09 ENCOUNTER — Inpatient Hospital Stay (HOSPITAL_COMMUNITY): Payer: Medicare Other

## 2013-06-09 ENCOUNTER — Inpatient Hospital Stay (HOSPITAL_COMMUNITY): Payer: Medicare Other | Admitting: Speech Pathology

## 2013-06-09 MED ORDER — ENSURE COMPLETE PO LIQD
237.0000 mL | Freq: Two times a day (BID) | ORAL | Status: DC
  Administered 2013-06-09 – 2013-06-10 (×2): 237 mL via ORAL

## 2013-06-09 NOTE — Progress Notes (Signed)
NUTRITION FOLLOW UP  Intervention:   Continue Ensure Complete BID, but between meal instead of with meals. Encourage PO intake  Nutrition Dx:   Malnutrition related to inadequate oral intake as evidenced by severe depletion of muscle mass and 19% weight loss in 3 months; Ongoing, improved  Goal:   Pt to meet >/= 90% of estimated needs; Ongoing  Monitor:   Weight trends, labs, PO intake, I/O's  Assessment:   Patient is a 68 y.o. male with a Past Medical History of recent back surgery complicated by a wound infection, malnutrition, diarrhea, laryngitis, multiple hospitalizations over the past few months. Pt now admitted to rehab.  Pt states that the Megace finally started working; he reports good appetite and eating 100% of all meals today, states he is interested in eating again. Pt's weight has increased 2 lbs in the past week. Per nursing notes, pt ate 50% of meals today. Encouraged continued intake of Ensure supplements 1-2 times daily to promote weight gain.   Labs: Low sodium, low hemoglobin  Height: Ht Readings from Last 1 Encounters:  05/23/13 5' 6"  (1.676 m)    Weight Status:   Wt Readings from Last 1 Encounters:  06/09/13 128 lb 15.5 oz (58.5 kg)  06/01/13 126 lb 05/31/13 122 lb 05/28/13 132 lb Admit wt (05/27/13)138 lb  Body mass index is 20.83 kg/(m^2).  Re-estimated needs:  Kcal: 1800-2000  Protein: 100-110 gm  Fluid: 1.8-2 L  Skin: intact  Diet Order: General   Intake/Output Summary (Last 24 hours) at 06/09/13 1450 Last data filed at 06/09/13 1215  Gross per 24 hour  Intake    600 ml  Output   1225 ml  Net   -625 ml    Last BM: 3/3   Labs:   Recent Labs Lab 06/06/13 0505 06/07/13 0631 06/08/13 0457  NA 135* 132* 134*  K 4.5 5.1 4.8  CL 99 97 99  CO2 27 26 26   BUN 8 8 9   CREATININE 0.55 0.63 0.52  CALCIUM 8.0* 8.6 8.6  GLUCOSE 110* 93 124*    CBG (last 3)  No results found for this basename: GLUCAP,  in the last 72  hours  Scheduled Meds: . aspirin EC  81 mg Oral q morning - 10a  . cholecalciferol  1,000 Units Oral Daily  . enoxaparin (LOVENOX) injection  40 mg Subcutaneous Q24H  . feeding supplement (ENSURE COMPLETE)  237 mL Oral TID BM  . FLUoxetine  40 mg Oral BID  . fluticasone  2 spray Each Nare QHS  . gabapentin  600 mg Oral BID  . guaiFENesin  1,200 mg Oral BID  . Linaclotide  290 mcg Oral Daily  . losartan  50 mg Oral Daily  . megestrol  400 mg Oral BID  . metoprolol tartrate  25 mg Oral BID  . OxyCODONE  10 mg Oral Q12H  . pantoprazole  40 mg Oral BID  . potassium chloride  20 mEq Oral Daily  . predniSONE  5 mg Oral QPM  . senna-docusate  2 tablet Oral BID  . simvastatin  40 mg Oral Q lunch  . tiotropium  18 mcg Inhalation Daily  . vancomycin  500 mg Intravenous Q8H    Continuous Infusions:   Pryor Ochoa RD, LDN Inpatient Clinical Dietitian Pager: [phone] After Hours Pager: [phone]

## 2013-06-09 NOTE — Progress Notes (Signed)
Patient ID: [name], male   DOB: 1945/07/12, 68 y.o.   MRN: [id number] Neuro satble. Fluid from lumbar area so far negative for infection

## 2013-06-09 NOTE — Progress Notes (Signed)
Subjective/Complaints: Pt feeling good. No fever. Pain controlled. Moving well.  A 12 point review of systems has been performed and if not noted above is otherwise negative.  Review of Systems -  Objective: Vital Signs: Blood pressure 109/64, pulse 69, temperature 98 F (36.7 C), temperature source Oral, resp. rate 18, weight 58.5 kg (128 lb 15.5 oz), SpO2 97.00%. No results found.  Recent Labs  06/08/13 0457  WBC 8.1  HGB 10.5*  HCT 31.5*  PLT 316    Recent Labs  06/07/13 0631 06/08/13 0457  NA 132* 134*  K 5.1 4.8  CL 97 99  GLUCOSE 93 124*  BUN 8 9  CREATININE 0.63 0.52  CALCIUM 8.6 8.6   CBG (last 3)  No results found for this basename: GLUCAP,  in the last 72 hours  Wt Readings from Last 3 Encounters:  06/09/13 58.5 kg (128 lb 15.5 oz)  05/27/13 61.75 kg (136 lb 2.1 oz)  05/18/13 58.968 kg (130 lb)    Physical Exam:  HENT: oral mucosa pink and moist, voice dysphonic Head: Normocephalic.  Eyes: EOM are normal.  Neck: Normal range of motion. Neck supple. No thyromegaly present.  Cardiovascular: Normal rate and regular rhythm.  Respiratory: Effort normal and breath sounds normal. No respiratory distress. Cough with upper airway sounds. No rales. GI: Soft. Bowel sounds are normal. He exhibits minimal distension. Soft. Non tender.   Patient was able to state his name and age. He was a poor medical historian , place is [place]  Skin: still dipahoretic Back incision is clean with granulation tissue noted, lumbar incision approximately 1 cm deep with slight drainage, no odor.  dressing in place  Neuro:  Alert and oriented Psych: pleasant and appropriate  Assessment/Plan: 1. Functional deficits secondary to Lumbar stenosis s/p L1-S1 fusion which require 3+ hours per day of interdisciplinary therapy in a comprehensive inpatient rehab setting. Physiatrist is providing close team supervision and 24 hour management of active medical problems listed  below. Physiatrist and rehab team continue to assess barriers to discharge/monitor patient progress toward functional and medical goals.  Will discuss with neurosurgery re: plan. Functionally, he's met goals. Need to see if anything grows out of aspirate culture.   FIM: FIM - Bathing Bathing Steps Patient Completed: Front perineal area;Right upper leg;Left upper leg;Chest;Right Arm;Left Arm;Abdomen;Buttocks;Right lower leg (including foot);Left lower leg (including foot) Bathing: 5: Supervision: Safety issues/verbal cues  FIM - Upper Body Dressing/Undressing Upper body dressing/undressing steps patient completed: Thread/unthread right sleeve of pullover shirt/dresss;Thread/unthread left sleeve of pullover shirt/dress;Pull shirt over trunk;Put head through opening of pull over shirt/dress Upper body dressing/undressing: 5: Set-up assist to: Obtain clothing/put away FIM - Lower Body Dressing/Undressing Lower body dressing/undressing steps patient completed: Pull underwear up/down;Pull pants up/down;Thread/unthread right underwear leg;Thread/unthread left underwear leg;Thread/unthread right pants leg;Thread/unthread left pants leg;Don/Doff right sock;Don/Doff left sock Lower body dressing/undressing: 4: Min-Patient completed 75 plus % of tasks (min assist for shoes)  FIM - Toileting Toileting steps completed by patient: Adjust clothing prior to toileting;Performs perineal hygiene;Adjust clothing after toileting Toileting Assistive Devices: Grab bar or rail for support Toileting: 0: Activity did not occur  FIM - Radio producer Devices: Grab bars Toilet Transfers: 0-Activity did not occur  FIM - Control and instrumentation engineer Devices: Copy: 6: Supine > Sit: No assist;6: Sit > Supine: No assist;5: Bed > Chair or W/C: Supervision (verbal cues/safety issues);5: Chair or W/C > Bed: Supervision (verbal cues/safety issues)  FIM -  Locomotion: Wheelchair Locomotion: Wheelchair: 0: Activity did not occur (no goal set) FIM - Locomotion: Ambulation Locomotion: Ambulation Assistive Devices: Administrator Ambulation/Gait Assistance: 4: Min assist Locomotion: Ambulation: 4: Travels 150 ft or more with minimal assistance (Pt.>75%)  Comprehension Comprehension Mode: Auditory Comprehension: 6-Follows complex conversation/direction: With extra time/assistive device  Expression Expression Mode: Verbal Expression: 6-Expresses complex ideas: With extra time/assistive device  Social Interaction Social Interaction: 6-Interacts appropriately with others with medication or extra time (anti-anxiety, antidepressant).  Problem Solving Problem Solving: 5-Solves basic 90% of the time/requires cueing < 10% of the time  Memory Memory: 6-Assistive device: No helper Medical Problem List and Plan:  1. 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 Patient with history of multiple back surgeries  2. DVT Prophylaxis/Anticoagulation: Subcutaneous Lovenox. Monitor platelet counts and any signs of bleeding  3. Pain Management: Neurontin 600 mg twice a day, OxyContin sustained-release 10 mg every 12 hours, Dilaudid 4 mg every 8 hours moderate to severe pain   -controlled at present, add anagesic creme and Kpad for neck, no problem last noc 4. Mood/depression. Prozac 40 mg twice a day, Xanax 0.5 mg 3 times a day as needed. Provide emotional support and  5. Neuropsych: This patient is not capable of making decisions on his own behalf.  6. Wound. Followup wound care nurse with dressings to back wound as directed  7. Hoarseness of voice after recent back surgery. Followup ENT Dr. Thera Flake.   normal scope/laryngoscopic exam. Continued followup speech therapy. Voice quality improving slowly 8. Rheumatoid arthritis. Chronic prednisone therapy. hold Humira for now. On 40 mg subcutaneous every 14 days as prior to  admission  9. Hypertension. Cozaar 50 mg daily, Lopressor 25 mg twice a day. off hctz, clonidine prn.  10. Hyperlipidemia. Zocor.  11. COPD. Spiriva daily, albuterol nebulizer every 2 hours as needed. Check oxygen saturations every shift  12. Nausea--  -improved but still not eating much, see below 13. Low grade temp/ID,   -wbc 8k  -low grade temp resolved   -iv vanc 739m q8--continue give fluid report and history- culture pending.   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

## 2013-06-09 NOTE — Progress Notes (Signed)
Speech Language Pathology Daily Session Note  Patient Details  Name: [name] MRN: [id number] Date of Birth: November 12, 1945  Today's Date: 06/09/2013 Time: 1315-1340 Time Calculation (min): 25 min  Short Term Goals: Week 2: SLP Short Term Goal 1 (Week 2): Patient will perform diaphragmatic breathing and respiratory support and control exercises with supervision cues. SLP Short Term Goal 2 (Week 2): Patient will perform vocal adduction and sustained phonation exercises with supervision cues.  Skilled Therapeutic Interventions: Skilled treatment focused on speech goals. Upon arrival, pt reporting back pain and requested to get back into bed. Pt directed care for a safe transfer to the bed from the wheelchair with supervision question cues. Pt was Mod I for utilization of diaphragmatic breathing while in supine and could sustain phonation for /ah/ and /e/ for ~3 seconds. Suspect decreased sustained phonation time is due to limited practice of vocal adduction exercises over the last few days. Pt educated on importance of performing exercises independently, he verbalized understanding. Continue with current plan of care.    FIM:  Comprehension Comprehension Mode: Auditory Comprehension: 6-Follows complex conversation/direction: With extra time/assistive device Expression Expression Mode: Verbal Expression: 5-Expresses complex 90% of the time/cues < 10% of the time Social Interaction Social Interaction: 6-Interacts appropriately with others with medication or extra time (anti-anxiety, antidepressant). Problem Solving Problem Solving: 5-Solves basic 90% of the time/requires cueing < 10% of the time Memory Memory: 6-Assistive device: No helper  Pain Pain in lower back, pt repositioned and RN made aware and administered medications   Therapy/Group: Individual Therapy  Ivis Henneman 06/09/2013, 1:55 PM

## 2013-06-09 NOTE — Progress Notes (Signed)
Reviewed and in agreement with discharge assessment provided.

## 2013-06-09 NOTE — Progress Notes (Signed)
Occupational Therapy Session Note  Patient Details  Name: [name] MRN: [id number] Date of Birth: 31-Jul-1945  Today's Date: 06/09/2013 Time: 0730-0830 Time Calculation (min): 60 min  Short Term Goals: Week 1:  OT Short Term Goal 1 (Week 1): Short Term Goals = Long Term Goals  Skilled Therapeutic Interventions/Progress Updates:  Upon entering room, patient found supine in bed with no complaints of pain. Patient engaged in bed mobility at mod I level, then performed stand pivot transfer using RW with supervision. Patient maneuvered self > sink for ADL at sink level. UB/LB bathing & dressing tasks completed in sit<>stand position. AE (reacher, long handled sponge, and long handled shoe horn) used to increase independence with LB ADLs. During application of TED hose, patient screamed out in pain, patient with complaints of pain in LLE; RN and DR aware. At end of session, left patient seated in w/c beside bed with all needed items within reach.   Precautions:  Precautions Precautions: Back;Fall Type of Shoulder Precautions: Pt able to state and adhere to 3/3 precautions Precaution Comments: pt verbalized understanding of 3/3 back precautions Required Braces or Orthoses: Spinal Brace (no brace ordered, brace worn for comfort) Spinal Brace: Lumbar corset;Applied in sitting position (applied for comfort) Restrictions Weight Bearing Restrictions: No  See FIM for current functional status  Therapy/Group: Individual Therapy  Steele Ledonne 06/09/2013, 8:42 AM

## 2013-06-09 NOTE — Progress Notes (Signed)
Physical Therapy Session Note  Patient Details  Name: [name] MRN: [id number] Date of Birth: March 06, 1946  Today's Date: 06/09/2013 Time: 2924-4628 Time Calculation (min): 55 min  Short Term Goals: Week 2:  PT Short Term Goal 1 (Week 2): =LTG  Skilled Therapeutic Interventions/Progress Updates:    Session began with pt performing gait from his room to the gym and back with S and the use of a RW (pt performing gait while working on dual task activities such as head turns and engaging in conversation) and performing cone weaves and turns with S and the use of a RW (pt showing improved ability to perform turns without LOB) in order to improve safety at home. Pt then performed an activity with close S and the use of a RW which included gait while kicking a beanbag (to help improve pt's ability to perform SLS) and laterally stepping while intermittently performing mini squats (pt needing verbal cueing in order to maintain appropriate BOS and for proper sequencing with RW) in order to improve pt's functional independence. Session concluded with pt performing various standing balance activities including cone taps (requiring Min A), static standing on aeromat x 1 min with pt in normal stance and with feet close together (requiring close S), catching a ball and throwing it back (mainly requiring close S but pt did require Mod A on one occasion due to LOB), stepping on small circular placemats (requiring S-Min A with purpose to improve pt's ability to perform SLS activities) and a task in which pt reached for horseshoes (requiring close S) with the purpose of these tasks being to improve pt's static and dynamic standing balance.   Therapy Documentation Precautions:  Precautions Precautions: Back;Fall Type of Shoulder Precautions: Pt able to state and adhere to 3/3 precautions Precaution Comments: pt verbalized understanding of 3/3 back precautions Required Braces or Orthoses: Spinal Brace (no  brace ordered, brace worn for comfort) Spinal Brace: Lumbar corset;Applied in sitting position (applied for comfort) Restrictions Weight Bearing Restrictions: No Pain:  Pt had no c/o pain during the session.  See FIM for current functional status  Therapy/Group: Individual Therapy  Elfego Giammarino 06/09/2013, 10:00 AM

## 2013-06-09 NOTE — Progress Notes (Signed)
Reviewed and in agreement with treatment provided.  

## 2013-06-09 NOTE — Progress Notes (Signed)
Physical Therapy Session Note  Patient Details  Name: [name] MRN: [id number] Date of Birth: 24-Oct-1945  Today's Date: 06/09/2013 Time: 8864-8472 Time Calculation (min): 40 min  Short Term Goals: Week 2:  PT Short Term Goal 1 (Week 2): =LTG  Skilled Therapeutic Interventions/Progress Updates:     Session consisted of pt performing gait from his room to the car transfer room with S and the use of a RW, stand pivot basic transfers and car transfers with S (improved use of BLE's noted when performing this transfer but pt demonstrated a slight LOB when performing car transfer which he was able to self correct), bed mobility being performed with Mod I, pt climbing and descending 8 steps (7" steps) with S and the use of bilateral handrails (pt able to climb stairs reciprocally), and pt working on the [place] with Min A and the use of bilateral UE support in order to help improve endurance, gait, mechanics and LE strength bilaterally. Pt has met all of his long term goals and is ready for discharge.   Therapy Documentation Precautions:  Precautions Precautions: Back;Fall Type of Shoulder Precautions: Pt able to state and adhere to 3/3 precautions Precaution Comments: pt verbalized understanding of 3/3 back precautions Required Braces or Orthoses: Spinal Brace (no brace ordered, brace worn for comfort) Spinal Brace: Lumbar corset;Applied in sitting position (applied for comfort) Restrictions Weight Bearing Restrictions: No Pain:  Pt had no c/o pain during the session.  See FIM for current functional status  Therapy/Group: Individual Therapy  Dayyan Krist 06/09/2013, 11:19 AM

## 2013-06-10 LAB — WOUND CULTURE: Culture: NO GROWTH

## 2013-06-10 MED ORDER — HYDROMORPHONE HCL 2 MG PO TABS: 2.0000 mg | ORAL_TABLET | Freq: Four times a day (QID) | ORAL | Status: DC | PRN

## 2013-06-10 MED ORDER — LOSARTAN POTASSIUM 50 MG PO TABS: 50.0000 mg | ORAL_TABLET | Freq: Every day | ORAL | Status: DC

## 2013-06-10 MED ORDER — FLUOXETINE HCL 40 MG PO CAPS: 40.0000 mg | ORAL_CAPSULE | Freq: Two times a day (BID) | ORAL | Status: DC

## 2013-06-10 MED ORDER — LINACLOTIDE 290 MCG PO CAPS: 290.0000 ug | ORAL_CAPSULE | Freq: Every day | ORAL | Status: DC

## 2013-06-10 MED ORDER — VITAMIN D 1000 UNITS PO TABS: 1000.0000 [IU] | ORAL_TABLET | Freq: Every day | ORAL | Status: DC

## 2013-06-10 MED ORDER — METHOCARBAMOL 500 MG PO TABS: 500.0000 mg | ORAL_TABLET | Freq: Four times a day (QID) | ORAL | Status: DC | PRN

## 2013-06-10 MED ORDER — ALPRAZOLAM 0.5 MG PO TABS: 0.5000 mg | ORAL_TABLET | Freq: Three times a day (TID) | ORAL | Status: DC | PRN

## 2013-06-10 MED ORDER — HYDROMORPHONE HCL 2 MG PO TABS
2.0000 mg | ORAL_TABLET | Freq: Once | ORAL | Status: AC
  Administered 2013-06-10: 2 mg via ORAL

## 2013-06-10 MED ORDER — ASPIRIN 81 MG PO TBEC: 81.0000 mg | DELAYED_RELEASE_TABLET | Freq: Every morning | ORAL | Status: DC

## 2013-06-10 MED ORDER — OXYCODONE HCL ER 10 MG PO T12A: 10.0000 mg | EXTENDED_RELEASE_TABLET | Freq: Two times a day (BID) | ORAL | Status: DC

## 2013-06-10 MED ORDER — SIMVASTATIN 40 MG PO TABS: 40.0000 mg | ORAL_TABLET | Freq: Every day | ORAL | Status: DC

## 2013-06-10 MED ORDER — METOPROLOL TARTRATE 25 MG PO TABS: 25.0000 mg | ORAL_TABLET | Freq: Two times a day (BID) | ORAL | Status: DC

## 2013-06-10 MED ORDER — GUAIFENESIN ER 600 MG PO TB12: 1200.0000 mg | ORAL_TABLET | Freq: Two times a day (BID) | ORAL | Status: DC

## 2013-06-10 MED ORDER — GABAPENTIN 300 MG PO CAPS: 600.0000 mg | ORAL_CAPSULE | Freq: Two times a day (BID) | ORAL | Status: DC

## 2013-06-10 MED ORDER — PREDNISONE 5 MG PO TABS: 5.0000 mg | ORAL_TABLET | Freq: Every evening | ORAL | Status: DC

## 2013-06-10 NOTE — Progress Notes (Signed)
Subjective/Complaints: Feeling well. No complaints. Anxious to go home today.  A 12 point review of systems has been performed and if not noted above is otherwise negative.  Review of Systems -  Objective: Vital Signs: Blood pressure 112/70, pulse 65, temperature 97 F (36.1 C), temperature source Oral, resp. rate 18, weight 58.06 kg (128 lb), SpO2 98.00%. No results found.  Recent Labs  06/08/13 0457  WBC 8.1  HGB 10.5*  HCT 31.5*  PLT 316    Recent Labs  06/08/13 0457  NA 134*  K 4.8  CL 99  GLUCOSE 124*  BUN 9  CREATININE 0.52  CALCIUM 8.6   CBG (last 3)  No results found for this basename: GLUCAP,  in the last 72 hours  Wt Readings from Last 3 Encounters:  06/10/13 58.06 kg (128 lb)  05/27/13 61.75 kg (136 lb 2.1 oz)  05/18/13 58.968 kg (130 lb)    Physical Exam:  HENT: oral mucosa pink and moist, voice dysphonic Head: Normocephalic.  Eyes: EOM are normal.  Neck: Normal range of motion. Neck supple. No thyromegaly present.  Cardiovascular: Normal rate and regular rhythm.  Respiratory: Effort normal and breath sounds normal. No respiratory distress. Cough with upper airway sounds. No rales. GI: Soft. Bowel sounds are normal. He exhibits minimal distension. Soft. Non tender.   Patient was able to state his name and age. He was a poor medical historian , place is [place]  Skin: still dipahoretic Back incision is clean with granulation tissue noted, no odor Neuro:  Alert and oriented Psych: pleasant and appropriate  Assessment/Plan: 1. Functional deficits secondary to Lumbar stenosis s/p L1-S1 fusion which require 3+ hours per day of interdisciplinary therapy in a comprehensive inpatient rehab setting. Physiatrist is providing close team supervision and 24 hour management of active medical problems listed below. Physiatrist and rehab team continue to assess barriers to discharge/monitor patient progress toward functional and medical goals.  Home  today potentially. Pt states that Dr. Joya Salm felt he was appropriate to go home today. His note doesn't state that. Attempting to follow up with him this morning   FIM: FIM - Bathing Bathing Steps Patient Completed: Front perineal area;Right upper leg;Left upper leg;Chest;Right Arm;Left Arm;Abdomen;Buttocks;Right lower leg (including foot);Left lower leg (including foot) Bathing: 5: Supervision: Safety issues/verbal cues  FIM - Upper Body Dressing/Undressing Upper body dressing/undressing steps patient completed: Thread/unthread right sleeve of pullover shirt/dresss;Thread/unthread left sleeve of pullover shirt/dress;Pull shirt over trunk;Put head through opening of pull over shirt/dress Upper body dressing/undressing: 5: Set-up assist to: Obtain clothing/put away FIM - Lower Body Dressing/Undressing Lower body dressing/undressing steps patient completed: Pull underwear up/down;Pull pants up/down;Thread/unthread right underwear leg;Thread/unthread left underwear leg;Thread/unthread right pants leg;Thread/unthread left pants leg;Don/Doff right sock;Don/Doff left sock;Fasten/unfasten right shoe;Fasten/unfasten left shoe Lower body dressing/undressing: 4: Min-Patient completed 75 plus % of tasks (needs assistance with donning bilateral shoes)  FIM - Toileting Toileting steps completed by patient: Adjust clothing prior to toileting;Performs perineal hygiene;Adjust clothing after toileting Toileting Assistive Devices: Grab bar or rail for support Toileting: 5: Supervision: Safety issues/verbal cues  FIM - Radio producer Devices: Elevated toilet seat;Grab bars;Walker Toilet Transfers: 5-To toilet/BSC: Supervision (verbal cues/safety issues);5-From toilet/BSC: Supervision (verbal cues/safety issues)  FIM - Control and instrumentation engineer Devices: Walker;Arm rests Bed/Chair Transfer: 6: Supine > Sit: No assist;6: Sit > Supine: No assist;5: Bed > Chair or  W/C: Supervision (verbal cues/safety issues);5: Chair or W/C > Bed: Supervision (verbal cues/safety issues)  FIM - Locomotion: Wheelchair  Locomotion: Wheelchair: 0: Activity did not occur (no goal set) FIM - Locomotion: Ambulation Locomotion: Ambulation Assistive Devices: Administrator Ambulation/Gait Assistance: 4: Min assist Locomotion: Ambulation: 5: Travels 150 ft or more with supervision/safety issues  Comprehension Comprehension Mode: Auditory Comprehension: 6-Follows complex conversation/direction: With extra time/assistive device  Expression Expression Mode: Verbal Expression: 6-Expresses complex ideas: With extra time/assistive device  Social Interaction Social Interaction: 6-Interacts appropriately with others with medication or extra time (anti-anxiety, antidepressant).  Problem Solving Problem Solving: 5-Solves basic 90% of the time/requires cueing < 10% of the time  Memory Memory: 6-Assistive device: No helper Medical Problem List and Plan:  1. 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 Patient with history of multiple back surgeries  2. DVT Prophylaxis/Anticoagulation: Subcutaneous Lovenox. Monitor platelet counts and any signs of bleeding  3. Pain Management: Neurontin 600 mg twice a day, OxyContin sustained-release 10 mg every 12 hours, Dilaudid 4 mg every 8 hours moderate to severe pain   -controlled at present,  4. Mood/depression. Prozac 40 mg twice a day, Xanax 0.5 mg 3 times a day as needed. Provide emotional support and  5. Neuropsych: This patient is not capable of making decisions on his own behalf.  6. Wound. Followup wound care nurse with dressings to back wound as directed  7. Hoarseness of voice after recent back surgery. Followup ENT Dr. Thera Flake.   normal scope/laryngoscopic exam. Continued followup speech therapy. Voice quality improving slowly 8. Rheumatoid arthritis. Chronic prednisone therapy. hold Humira for  now. On 40 mg subcutaneous every 14 days as prior to admission  9. Hypertension. Cozaar 50 mg daily, Lopressor 25 mg twice a day. off hctz, clonidine prn.  10. Hyperlipidemia. Zocor.  11. COPD. Spiriva daily, albuterol nebulizer every 2 hours as needed. Check oxygen saturations every shift  12. Nausea--  -improved but still not eating much, see below 13. Low grade temp/ID,   -wbc 8k  -low grade temp resolved   -iv vanc 761m q8 for now  -GS only has WBC's. 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

## 2013-06-10 NOTE — Discharge Summary (Signed)
Discharge summary job [id number]

## 2013-06-10 NOTE — Progress Notes (Signed)
Social Work  Discharge Note  The overall goal for the admission was met for:   Discharge location: Yes - home with son and dtr-in-law to provide 24/7 assist  Length of Stay: Yes - 15 days  Discharge activity level: Yes - supervision  Home/community participation: Yes  Services provided included: MD, RD, PT, OT, SLP, RN, TR, Pharmacy, Ages: Medicare  Follow-up services arranged: Home Health: RN, PT via [place], DME: 16x16 lightweight w/c, cushion via [place] and Patient/Family has no preference for HH/DME agencies  Comments (or additional information):  Patient/Family verbalized understanding of follow-up arrangements: Yes  Individual responsible for coordination of the follow-up plan: patient  Confirmed correct DME delivered: Vergil Burby 06/10/2013    Briel Gallicchio

## 2013-06-10 NOTE — Progress Notes (Signed)
Patient ID: [name], male   DOB: 01/17/1946, 68 y.o.   MRN: [id number] Stable. Cultures negative.

## 2013-06-10 NOTE — Progress Notes (Signed)
Speech Language Pathology Discharge Summary  Patient Details  Name: [name] MRN: [id number] Date of Birth: [birth year]  Today's Date: 06/10/2013  Patient has met 1 of 1 long term goals.  Patient to discharge at overall Supervision level.   Reasons goals not met: N/A   Clinical Impression/Discharge Summary: Pt has made functional gains and has met 1 of 1 LTG's this admission due to increased utilization of diaphragmatic breathing exercises at the word and phrase level. Pt can perform vocal adduction exercises with supervision cueing and continues to be aphonic at times, however, pt does demonstrate spontaneous vocalizations that have a hoarse vocal quality. Pt/family education complete and pt will discharge home with assistance.  Recommend f/u outpatient SLP services to maximize vocal function in order to maximize over functional communication.   Care Partner:  Caregiver Able to Provide Assistance: Yes     Recommendation:  Outpatient SLP  Rationale for SLP Follow Up: Maximize functional communication   Equipment: N/A   Reasons for discharge: Treatment goals met   Patient/Family Agrees with Progress Made and Goals Achieved: Yes   See FIM for current functional status  [name] 06/10/2013, 1:00 PM

## 2013-06-10 NOTE — Discharge Instructions (Signed)
Inpatient Rehab Discharge Instructions  [facility] Discharge date and time: No discharge date for patient encounter.   Activities/Precautions/ Functional Status: Activity: activity as tolerated Diet: regular diet Wound Care: keep wound clean and dry Functional status:  ___ No restrictions     ___ Walk up steps independently _x__ 24/7 supervision/assistance   ___ Walk up steps with assistance ___ Intermittent supervision/assistance  ___ Bathe/dress independently ___ Walk with walker     ___ Bathe/dress with assistance ___ Walk Independently    ___ Shower independently _x__ Walk with assistance    ___ Shower with assistance ___ No alcohol     ___ Return to work/school ________    COMMUNITY REFERRALS UPON DISCHARGE:    Home Health:   PT    RN                      Agency: [facility] Phone: [phone]   Medical Equipment/Items Ordered: wheelchair and cushion                                                     Agency/Supplier: [facility] @ [phone]        Special Instructions:  Pack strip of silver Hydrofiber into tunnel area of low back wound and then allow strip to cover the remainder of the open wound, cover with dry dressing  My questions have been answered and I understand these instructions. I will adhere to these goals and the provided educational materials after my discharge from the hospital.  Patient/Caregiver Signature _______________________________ Date __________  Clinician Signature _______________________________________ Date __________  Please bring this form and your medication list with you to all your follow-up doctor's appointments.

## 2013-06-11 NOTE — Discharge Summary (Signed)
NAMESEDRIC, [name]            ACCOUNT NO.:  [id number]  MEDICAL RECORD NO.:  [id number]  LOCATION:  4W06C                        FACILITY:  [facility]  PHYSICIAN:  Meredith Staggers, M.D.DATE OF BIRTH:  Mar 20, 1946  DATE OF ADMISSION:  05/27/2013 DATE OF DISCHARGE:  06/10/2013                              DISCHARGE SUMMARY   DISCHARGE DIAGNOSES: 1. Lumbar post laminectomy syndrome with postoperative infection and     deconditioning. 2. Subcutaneous Lovenox for DVT prophylaxis. 3. Pain management. 4. Depression. 5. Wound care. 6. Hoarseness of voice after recent back surgery. 7. Rheumatoid arthritis. 8. Hypertension. 9. Hyperlipidemia. 10.Chronic obstructive pulmonary disease. 11.Nausea-resolved. 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  HISTORY OF PRESENT ILLNESS:  This is a 68 year old right-handed male with history of rheumatoid arthritis on chronic prednisone, multiple back procedures complicated by hypotension requiring pressor support. He was discharged to recent skilled nursing facility, returned to [facility] March 16, 2013, with a fever, wound drainage.  On March 17, 2013, returned to the operating room for debridement of his wound.  A wound swab grew P-aeruginosa that was pansensitive.  He was discharged to home with assistance from his wife, presented May 23, 2013 with nausea, vomiting, poor appetite, low grade fever.  He had been using a wheelchair, a rolling walker prior to admission.  Recent CT of the head negative.  Chest x-ray, negative.  Urine culture 25,000 multi species. Mild hyponatremia 126.  Maintained on broad-spectrum antibiotics per Infectious Disease with all antibiotics since discontinued and monitored.  Lumbar wound healing nicely.  Subcutaneous Lovenox for DVT prophylaxis.  Physical and occupational therapy ongoing.  The patient was admitted for a comprehensive rehab program.  PAST MEDICAL HISTORY:  See discharge  diagnoses.  SOCIAL HISTORY:  Lives with spouse.  FUNCTIONAL HISTORY PRIOR TO ADMISSION:  Using a walker for short distances.  FUNCTIONAL STATUS UPON ADMISSION TO REHAB SERVICES:  Ambulating 200 feet with decreased gait, minimal assist to support trunk for balance during ambulation, minimal assist upper body dressing, moderate assist lower body dressing.  PHYSICAL EXAMINATION:  VITAL SIGNS:  Blood pressure 128/78, pulse 69, temperature 98.9, respirations 18. GENERAL:  This was an alert male, in no acute distress. HEENT:  Pupils round and reactive to light. LUNGS:  Clear to auscultation. CARDIAC:  Regular rate and rhythm. ABDOMEN:  Soft, nontender.  Good bowel sounds.  He gives a limited medical story, and he was able to provide his name and age.  His voice was hoarse but intelligible.  REHABILITATION HOSPITAL COURSE:  The patient was admitted to inpatient rehab services with therapies initiated on a 3-hour daily basis consisting of physical therapy, occupational therapy, speech therapy, and rehabilitation nursing.  The following issues were addressed during the patient's rehabilitation stay.  Pertaining to [name]'s lumbar post laminectomy syndrome, postoperative infection, all antibiotics had recently been discontinued.  Most recent lumbar spine film for low-grade fever showed a paraspinal fluid collection along the surgical approach that favored seroma.  Interventional Radiology consulted for aspiration which was completed June 08, 2013.  He had been resumed back on empiric antibiotics.  Final culture negative, no growth with followup per Neurosurgery.  Antibiotics at that time have been discontinued.  He  had been on subcutaneous Lovenox for DVT prophylaxis.  No bleeding episodes. Pain management with the use of Neurontin as well as OxyContin with Dilaudid for breakthrough pain.  He did have a history of depression. He continued on Prozac and Xanax with emotional support  provided.  He did have some hoarseness of voice after most recent back surgery.  He was to follow up with Dr. Simeon Craft.  Most recent normal scope and laryngoscopic exam was unremarkable.  Voice quality was slowly improving.  He would remain on chronic prednisone therapy for rheumatoid arthritis, currently hold on Humira for now.  Blood pressures remained well controlled.  He did have a history of COPD, no increased shortness of breath.  Noted SIADH some variables in his sodium levels.  He was now off his hydrochlorothiazide.  He had been placed on salt tablets for a short time, as well as Declomycin which had since been discontinued. Fluid restriction ongoing.  Latest sodium level of 134.  His sodium restricted had since been lifted.  The patient received weekly collaborative interdisciplinary team conferences to discuss estimated length of stay, family teaching, and any barriers to discharge.  He was discharged ambulation level of close supervision using a rolling walker. Strength and endurance continued to improve.  Family had been in for full family teaching with recommendations of ongoing supervision.  The patient engaged in bed mobility at modified independent level, then performed stand pivot transfers using a rolling walker with supervision maneuvered himself to the same for activities of daily living.  He was discharged to home with ongoing therapies dictated per [facility].  DISCHARGE MEDICATIONS: 1. Tylenol as needed. 2. Xanax 0.5 mg p.o. t.i.d. as needed. 3. Aspirin 81 mg p.o. daily. 4. Vitamin D 1000 units p.o. daily. 5. Prozac 40 mg p.o. b.i.d. 6. Flonase 2 sprays each nostril at bedtime. 7. Neurontin 600 mg p.o. b.i.d. 8. Mucinex 1200 mg p.o. b.i.d. 9. Dilaudid 2 mg every 6 hours as needed moderate pain, dispensed of     90 tablets. 10.Linzess 290 mcg p.o. daily. 11.Cozaar 50 mg p.o. daily. 12.Robaxin 500 mg p.o. every 6 hours as needed muscle spasms. 13.Lopressor 25  mg p.o. b.i.d. 14.OxyContin sustained release 10 mg p.o. every 12 hours x3 weeks. 15.Protonix 40 mg p.o. b.i.d. 16.Prednisone 5 mg p.o. daily. 17.Senokot-S 2 tablets p.o. b.i.d. 18.Zocor 40 mg p.o. daily. 19.Spiriva 18 mcg daily.  DIET:  Regular.  SPECIAL INSTRUCTIONS:  Follow up Dr. Alger Simons at the outpatient rehab service office July 30, 2013; Dr. Leeroy Cha 2 weeks call for appointment; Dr. Michel Bickers, Infectious Disease 2 weeks; Dr. Ruby Cola in relation to his hoarseness of voice of ENT Services, Dr. Adella Hare for medical management.  Special instructions; the patient should remain off Humira for now and follow up with Dr. Linda Hedges.     Lauraine Rinne, P.A.   ______________________________ Meredith Staggers, M.D.    DA/MEDQ  D:  06/10/2013  T:  06/11/2013  Job:  [id number]  cc:   Michel Bickers, M.D. Ruby Cola, MD Heinz Knuckles. [place], MD

## 2013-06-14 NOTE — Progress Notes (Signed)
Pt would be classified as moderately malnourished during the initial to middle phase of his hospitalization.

## 2013-06-15 ENCOUNTER — Ambulatory Visit: Payer: Medicare Other | Admitting: Physical Therapy

## 2013-06-16 ENCOUNTER — Emergency Department (HOSPITAL_COMMUNITY): Payer: Medicare Other

## 2013-06-16 ENCOUNTER — Emergency Department (HOSPITAL_COMMUNITY)
Admission: EM | Admit: 2013-06-16 | Discharge: 2013-06-16 | Disposition: A | Discharge status: Other Acute Inpatient Hospital | Payer: Medicare Other | Source: Home / Self Care

## 2013-06-16 ENCOUNTER — Encounter (HOSPITAL_COMMUNITY): Payer: Self-pay | Admitting: Emergency Medicine

## 2013-06-16 ENCOUNTER — Inpatient Hospital Stay (HOSPITAL_COMMUNITY)
Admission: EM | Admit: 2013-06-16 | Discharge: 2013-06-19 | DRG: 071 | Disposition: A | Discharge status: Home / Self Care | Payer: Medicare Other | Attending: Internal Medicine | Admitting: Internal Medicine

## 2013-06-16 DIAGNOSIS — M1990 Unspecified osteoarthritis, unspecified site: Secondary | ICD-10-CM | POA: Diagnosis present

## 2013-06-16 DIAGNOSIS — F329 Major depressive disorder, single episode, unspecified: Secondary | ICD-10-CM | POA: Diagnosis present

## 2013-06-16 DIAGNOSIS — T402X1A Poisoning by other opioids, accidental (unintentional), initial encounter: Secondary | ICD-10-CM

## 2013-06-16 DIAGNOSIS — Z4889 Encounter for other specified surgical aftercare: Secondary | ICD-10-CM

## 2013-06-16 DIAGNOSIS — R296 Repeated falls: Secondary | ICD-10-CM

## 2013-06-16 DIAGNOSIS — E860 Dehydration: Secondary | ICD-10-CM

## 2013-06-16 DIAGNOSIS — E236 Other disorders of pituitary gland: Secondary | ICD-10-CM | POA: Diagnosis present

## 2013-06-16 DIAGNOSIS — F3289 Other specified depressive episodes: Secondary | ICD-10-CM | POA: Diagnosis present

## 2013-06-16 DIAGNOSIS — F4489 Other dissociative and conversion disorders: Secondary | ICD-10-CM

## 2013-06-16 DIAGNOSIS — R519 Headache, unspecified: Secondary | ICD-10-CM

## 2013-06-16 DIAGNOSIS — Z79899 Other long term (current) drug therapy: Secondary | ICD-10-CM

## 2013-06-16 DIAGNOSIS — T424X5A Adverse effect of benzodiazepines, initial encounter: Secondary | ICD-10-CM | POA: Diagnosis present

## 2013-06-16 DIAGNOSIS — T428X5A Adverse effect of antiparkinsonism drugs and other central muscle-tone depressants, initial encounter: Secondary | ICD-10-CM | POA: Diagnosis present

## 2013-06-16 DIAGNOSIS — R634 Abnormal weight loss: Secondary | ICD-10-CM

## 2013-06-16 DIAGNOSIS — R410 Disorientation, unspecified: Secondary | ICD-10-CM

## 2013-06-16 DIAGNOSIS — F29 Unspecified psychosis not due to a substance or known physiological condition: Secondary | ICD-10-CM

## 2013-06-16 DIAGNOSIS — M961 Postlaminectomy syndrome, not elsewhere classified: Secondary | ICD-10-CM

## 2013-06-16 DIAGNOSIS — Y92009 Unspecified place in unspecified non-institutional (private) residence as the place of occurrence of the external cause: Secondary | ICD-10-CM

## 2013-06-16 DIAGNOSIS — T8149XA Infection following a procedure, other surgical site, initial encounter: Secondary | ICD-10-CM

## 2013-06-16 DIAGNOSIS — I4891 Unspecified atrial fibrillation: Secondary | ICD-10-CM

## 2013-06-16 DIAGNOSIS — R4182 Altered mental status, unspecified: Secondary | ICD-10-CM | POA: Diagnosis present

## 2013-06-16 DIAGNOSIS — T4275XA Adverse effect of unspecified antiepileptic and sedative-hypnotic drugs, initial encounter: Secondary | ICD-10-CM | POA: Diagnosis present

## 2013-06-16 DIAGNOSIS — I2510 Atherosclerotic heart disease of native coronary artery without angina pectoris: Secondary | ICD-10-CM | POA: Diagnosis present

## 2013-06-16 DIAGNOSIS — E43 Unspecified severe protein-calorie malnutrition: Secondary | ICD-10-CM

## 2013-06-16 DIAGNOSIS — R509 Fever, unspecified: Secondary | ICD-10-CM

## 2013-06-16 DIAGNOSIS — G8929 Other chronic pain: Secondary | ICD-10-CM | POA: Diagnosis present

## 2013-06-16 DIAGNOSIS — R51 Headache: Secondary | ICD-10-CM

## 2013-06-16 DIAGNOSIS — J9601 Acute respiratory failure with hypoxia: Secondary | ICD-10-CM

## 2013-06-16 DIAGNOSIS — R6889 Other general symptoms and signs: Secondary | ICD-10-CM

## 2013-06-16 DIAGNOSIS — Z96659 Presence of unspecified artificial knee joint: Secondary | ICD-10-CM

## 2013-06-16 DIAGNOSIS — D72829 Elevated white blood cell count, unspecified: Secondary | ICD-10-CM | POA: Diagnosis present

## 2013-06-16 DIAGNOSIS — N179 Acute kidney failure, unspecified: Secondary | ICD-10-CM

## 2013-06-16 DIAGNOSIS — M069 Rheumatoid arthritis, unspecified: Secondary | ICD-10-CM | POA: Diagnosis present

## 2013-06-16 DIAGNOSIS — K219 Gastro-esophageal reflux disease without esophagitis: Secondary | ICD-10-CM | POA: Diagnosis present

## 2013-06-16 DIAGNOSIS — Z96619 Presence of unspecified artificial shoulder joint: Secondary | ICD-10-CM

## 2013-06-16 DIAGNOSIS — Z9181 History of falling: Secondary | ICD-10-CM

## 2013-06-16 DIAGNOSIS — F411 Generalized anxiety disorder: Secondary | ICD-10-CM | POA: Diagnosis present

## 2013-06-16 DIAGNOSIS — Z9861 Coronary angioplasty status: Secondary | ICD-10-CM

## 2013-06-16 DIAGNOSIS — E871 Hypo-osmolality and hyponatremia: Secondary | ICD-10-CM | POA: Diagnosis present

## 2013-06-16 DIAGNOSIS — Z96669 Presence of unspecified artificial ankle joint: Secondary | ICD-10-CM

## 2013-06-16 DIAGNOSIS — Z7982 Long term (current) use of aspirin: Secondary | ICD-10-CM

## 2013-06-16 DIAGNOSIS — G9341 Metabolic encephalopathy: Principal | ICD-10-CM | POA: Diagnosis present

## 2013-06-16 DIAGNOSIS — M479 Spondylosis, unspecified: Secondary | ICD-10-CM

## 2013-06-16 DIAGNOSIS — J4489 Other specified chronic obstructive pulmonary disease: Secondary | ICD-10-CM | POA: Diagnosis present

## 2013-06-16 DIAGNOSIS — R339 Retention of urine, unspecified: Secondary | ICD-10-CM | POA: Diagnosis present

## 2013-06-16 DIAGNOSIS — D649 Anemia, unspecified: Secondary | ICD-10-CM

## 2013-06-16 DIAGNOSIS — G9340 Encephalopathy, unspecified: Secondary | ICD-10-CM

## 2013-06-16 DIAGNOSIS — K5730 Diverticulosis of large intestine without perforation or abscess without bleeding: Secondary | ICD-10-CM | POA: Diagnosis present

## 2013-06-16 DIAGNOSIS — Z981 Arthrodesis status: Secondary | ICD-10-CM

## 2013-06-16 DIAGNOSIS — I10 Essential (primary) hypertension: Secondary | ICD-10-CM

## 2013-06-16 DIAGNOSIS — J449 Chronic obstructive pulmonary disease, unspecified: Secondary | ICD-10-CM | POA: Diagnosis present

## 2013-06-16 DIAGNOSIS — E785 Hyperlipidemia, unspecified: Secondary | ICD-10-CM

## 2013-06-16 DIAGNOSIS — R6510 Systemic inflammatory response syndrome (SIRS) of non-infectious origin without acute organ dysfunction: Secondary | ICD-10-CM

## 2013-06-16 DIAGNOSIS — F1190 Opioid use, unspecified, uncomplicated: Secondary | ICD-10-CM

## 2013-06-16 HISTORY — DX: Repeated falls: R29.6

## 2013-06-16 HISTORY — DX: Pure hypercholesterolemia, unspecified: E78.00

## 2013-06-16 LAB — COMPREHENSIVE METABOLIC PANEL
ALT: 9 U/L (ref 0–53)
AST: 16 U/L (ref 0–37)
Albumin: 3.3 g/dL — ABNORMAL LOW (ref 3.5–5.2)
Alkaline Phosphatase: 53 U/L (ref 39–117)
BUN: 21 mg/dL (ref 6–23)
CO2: 23 mEq/L (ref 19–32)
Calcium: 9 mg/dL (ref 8.4–10.5)
Chloride: 89 mEq/L — ABNORMAL LOW (ref 96–112)
Creatinine, Ser: 0.54 mg/dL (ref 0.50–1.35)
GFR calc Af Amer: >90 mL/min (ref >90)
GFR calc non Af Amer: >90 mL/min (ref >90)
Glucose, Bld: 106 mg/dL — ABNORMAL HIGH (ref 70–99)
Potassium: 4.2 mEq/L (ref 3.7–5.3)
Sodium: 126 mEq/L — ABNORMAL LOW (ref 137–147)
Total Bilirubin: 0.9 mg/dL (ref 0.3–1.2)
Total Protein: 7 g/dL (ref 6.0–8.3)

## 2013-06-16 LAB — CBC
HCT: 37.3 % — ABNORMAL LOW (ref 39.0–52.0)
Hemoglobin: 13 g/dL (ref 13.0–17.0)
MCH: 29.9 pg (ref 26.0–34.0)
MCHC: 34.9 g/dL (ref 30.0–36.0)
MCV: 85.7 fL (ref 78.0–100.0)
Platelets: 212 10*3/uL (ref 150–400)
RBC: 4.35 MIL/uL (ref 4.22–5.81)
RDW: 16.8 % — ABNORMAL HIGH (ref 11.5–15.5)
WBC: 14.7 10*3/uL — ABNORMAL HIGH (ref 4.0–10.5)

## 2013-06-16 LAB — POCT URINALYSIS DIP (DEVICE)
Bilirubin Urine: NEGATIVE
Glucose, UA: NEGATIVE mg/dL
Hgb urine dipstick: NEGATIVE
Ketones, ur: NEGATIVE mg/dL
Leukocytes, UA: NEGATIVE
Nitrite: NEGATIVE
Protein, ur: NEGATIVE mg/dL
Specific Gravity, Urine: 1.02 (ref 1.005–1.030)
Urobilinogen, UA: 0.2 mg/dL (ref 0.0–1.0)
pH: 7 (ref 5.0–8.0)

## 2013-06-16 LAB — SEDIMENTATION RATE: Sed Rate: 20 mm/hr — ABNORMAL HIGH (ref 0–16)

## 2013-06-16 LAB — URINALYSIS, ROUTINE W REFLEX MICROSCOPIC
Bilirubin Urine: NEGATIVE
Glucose, UA: NEGATIVE mg/dL
Hgb urine dipstick: NEGATIVE
Ketones, ur: NEGATIVE mg/dL
Leukocytes, UA: NEGATIVE
Nitrite: NEGATIVE
Protein, ur: NEGATIVE mg/dL
Specific Gravity, Urine: 1.023 (ref 1.005–1.030)
Urobilinogen, UA: 1 mg/dL (ref 0.0–1.0)
pH: 6.5 (ref 5.0–8.0)

## 2013-06-16 LAB — I-STAT TROPONIN, ED: Troponin i, poc: 0 ng/mL (ref 0.00–0.08)

## 2013-06-16 LAB — I-STAT CG4 LACTIC ACID, ED: Lactic Acid, Venous: 1.54 mmol/L (ref 0.5–2.2)

## 2013-06-16 MED ORDER — PNEUMOCOCCAL VAC POLYVALENT 25 MCG/0.5ML IJ INJ
0.5000 mL | INJECTION | INTRAMUSCULAR | Status: DC
  Filled 2013-06-16: qty 0.5

## 2013-06-16 MED ORDER — GUAIFENESIN ER 600 MG PO TB12
1200.0000 mg | ORAL_TABLET | Freq: Two times a day (BID) | ORAL | Status: DC
  Administered 2013-06-16 – 2013-06-19 (×6): 1200 mg via ORAL
  Filled 2013-06-16 (×7): qty 2

## 2013-06-16 MED ORDER — ALPRAZOLAM 0.5 MG PO TABS
0.5000 mg | ORAL_TABLET | Freq: Three times a day (TID) | ORAL | Status: DC | PRN
  Administered 2013-06-17: 0.5 mg via ORAL
  Filled 2013-06-16: qty 1

## 2013-06-16 MED ORDER — FLUTICASONE PROPIONATE 50 MCG/ACT NA SUSP
2.0000 | Freq: Every day | NASAL | Status: DC
  Administered 2013-06-16 – 2013-06-18 (×3): 2 via NASAL
  Filled 2013-06-16: qty 16

## 2013-06-16 MED ORDER — ACETAMINOPHEN 650 MG RE SUPP: 650.0000 mg | Freq: Four times a day (QID) | RECTAL | Status: DC | PRN

## 2013-06-16 MED ORDER — ASPIRIN 81 MG PO TBEC: 81.0000 mg | DELAYED_RELEASE_TABLET | Freq: Every morning | ORAL | Status: DC

## 2013-06-16 MED ORDER — LOSARTAN POTASSIUM 50 MG PO TABS
50.0000 mg | ORAL_TABLET | Freq: Every day | ORAL | Status: DC
  Administered 2013-06-17 – 2013-06-19 (×3): 50 mg via ORAL
  Filled 2013-06-16 (×3): qty 1

## 2013-06-16 MED ORDER — ENOXAPARIN SODIUM 40 MG/0.4ML ~~LOC~~ SOLN
40.0000 mg | SUBCUTANEOUS | Status: DC
  Administered 2013-06-16 – 2013-06-18 (×3): 40 mg via SUBCUTANEOUS
  Filled 2013-06-16 (×4): qty 0.4

## 2013-06-16 MED ORDER — SIMVASTATIN 40 MG PO TABS
40.0000 mg | ORAL_TABLET | Freq: Every day | ORAL | Status: DC
  Administered 2013-06-17 – 2013-06-19 (×3): 40 mg via ORAL
  Filled 2013-06-16 (×3): qty 1

## 2013-06-16 MED ORDER — METOPROLOL TARTRATE 25 MG PO TABS
25.0000 mg | ORAL_TABLET | Freq: Two times a day (BID) | ORAL | Status: DC
  Administered 2013-06-16 – 2013-06-19 (×6): 25 mg via ORAL
  Filled 2013-06-16 (×7): qty 1

## 2013-06-16 MED ORDER — ONDANSETRON HCL 4 MG PO TABS
4.0000 mg | ORAL_TABLET | Freq: Four times a day (QID) | ORAL | Status: DC | PRN
  Administered 2013-06-17 – 2013-06-18 (×2): 4 mg via ORAL
  Filled 2013-06-16 (×2): qty 1

## 2013-06-16 MED ORDER — LINACLOTIDE 290 MCG PO CAPS
290.0000 ug | ORAL_CAPSULE | Freq: Every day | ORAL | Status: DC
  Administered 2013-06-17 – 2013-06-19 (×3): 290 ug via ORAL
  Filled 2013-06-16 (×3): qty 1

## 2013-06-16 MED ORDER — OXYCODONE HCL ER 10 MG PO T12A
10.0000 mg | EXTENDED_RELEASE_TABLET | Freq: Two times a day (BID) | ORAL | Status: DC
  Administered 2013-06-16 – 2013-06-19 (×6): 10 mg via ORAL
  Filled 2013-06-16 (×6): qty 1

## 2013-06-16 MED ORDER — FLUOXETINE HCL 40 MG PO CAPS: 40.0000 mg | ORAL_CAPSULE | Freq: Two times a day (BID) | ORAL | Status: DC

## 2013-06-16 MED ORDER — DOCUSATE SODIUM 100 MG PO CAPS
100.0000 mg | ORAL_CAPSULE | Freq: Two times a day (BID) | ORAL | Status: DC
  Administered 2013-06-16 – 2013-06-19 (×6): 100 mg via ORAL
  Filled 2013-06-16 (×7): qty 1

## 2013-06-16 MED ORDER — POLYVINYL ALCOHOL 1.4 % OP SOLN
2.0000 [drp] | Freq: Two times a day (BID) | OPHTHALMIC | Status: DC | PRN
  Filled 2013-06-16: qty 15

## 2013-06-16 MED ORDER — POLYETHYL GLYCOL-PROPYL GLYCOL 0.4-0.3 % OP SOLN: 2.0000 [drp] | Freq: Two times a day (BID) | OPHTHALMIC | Status: DC | PRN

## 2013-06-16 MED ORDER — FLUOXETINE HCL 20 MG PO CAPS
40.0000 mg | ORAL_CAPSULE | Freq: Two times a day (BID) | ORAL | Status: DC
  Administered 2013-06-16 – 2013-06-17 (×2): 40 mg via ORAL
  Filled 2013-06-16 (×3): qty 2

## 2013-06-16 MED ORDER — HYDROCODONE-ACETAMINOPHEN 5-325 MG PO TABS
1.0000 | ORAL_TABLET | ORAL | Status: DC | PRN
  Administered 2013-06-17 – 2013-06-19 (×5): 2 via ORAL
  Filled 2013-06-16 (×5): qty 2

## 2013-06-16 MED ORDER — VITAMIN D3 25 MCG (1000 UNIT) PO TABS
1000.0000 [IU] | ORAL_TABLET | Freq: Every day | ORAL | Status: DC
  Administered 2013-06-17 – 2013-06-19 (×3): 1000 [IU] via ORAL
  Filled 2013-06-16 (×3): qty 1

## 2013-06-16 MED ORDER — SENNA 8.6 MG PO TABS
1.0000 | ORAL_TABLET | Freq: Two times a day (BID) | ORAL | Status: DC
  Administered 2013-06-16 – 2013-06-19 (×6): 8.6 mg via ORAL
  Filled 2013-06-16 (×7): qty 1

## 2013-06-16 MED ORDER — HYDROMORPHONE HCL PF 1 MG/ML IJ SOLN
0.5000 mg | INTRAMUSCULAR | Status: DC | PRN
  Administered 2013-06-17: 0.5 mg via INTRAVENOUS
  Filled 2013-06-16: qty 1

## 2013-06-16 MED ORDER — METHOCARBAMOL 500 MG PO TABS
500.0000 mg | ORAL_TABLET | Freq: Four times a day (QID) | ORAL | Status: DC | PRN
  Filled 2013-06-16: qty 1

## 2013-06-16 MED ORDER — SODIUM CHLORIDE 0.9 % IV SOLN
INTRAVENOUS | Status: DC
  Administered 2013-06-17: 02:00:00 via INTRAVENOUS

## 2013-06-16 MED ORDER — ALBUTEROL SULFATE (2.5 MG/3ML) 0.083% IN NEBU
2.5000 mg | INHALATION_SOLUTION | Freq: Four times a day (QID) | RESPIRATORY_TRACT | Status: DC
  Administered 2013-06-17 (×2): 2.5 mg via RESPIRATORY_TRACT
  Filled 2013-06-16 (×2): qty 3

## 2013-06-16 MED ORDER — ONDANSETRON HCL 4 MG/2ML IJ SOLN
4.0000 mg | Freq: Four times a day (QID) | INTRAMUSCULAR | Status: DC | PRN
  Administered 2013-06-18: 4 mg via INTRAVENOUS
  Filled 2013-06-16: qty 2

## 2013-06-16 MED ORDER — PANTOPRAZOLE SODIUM 20 MG PO TBEC
20.0000 mg | DELAYED_RELEASE_TABLET | Freq: Every day | ORAL | Status: DC
  Administered 2013-06-17 – 2013-06-19 (×3): 20 mg via ORAL
  Filled 2013-06-16 (×3): qty 1

## 2013-06-16 MED ORDER — SODIUM CHLORIDE 0.9 % IV SOLN
INTRAVENOUS | Status: DC
  Administered 2013-06-16: 18:00:00 via INTRAVENOUS

## 2013-06-16 MED ORDER — VITAMIN B-1 100 MG PO TABS
100.0000 mg | ORAL_TABLET | Freq: Every day | ORAL | Status: DC
  Administered 2013-06-16 – 2013-06-19 (×4): 100 mg via ORAL
  Filled 2013-06-16 (×4): qty 1

## 2013-06-16 MED ORDER — BISACODYL 10 MG RE SUPP: 10.0000 mg | Freq: Every day | RECTAL | Status: DC | PRN

## 2013-06-16 MED ORDER — ASPIRIN EC 81 MG PO TBEC
81.0000 mg | DELAYED_RELEASE_TABLET | Freq: Every day | ORAL | Status: DC
  Administered 2013-06-17 – 2013-06-19 (×3): 81 mg via ORAL
  Filled 2013-06-16 (×3): qty 1

## 2013-06-16 MED ORDER — PREDNISONE 5 MG PO TABS
5.0000 mg | ORAL_TABLET | Freq: Every day | ORAL | Status: DC
  Administered 2013-06-17 – 2013-06-19 (×3): 5 mg via ORAL
  Filled 2013-06-16 (×4): qty 1

## 2013-06-16 MED ORDER — ACETAMINOPHEN 325 MG PO TABS
650.0000 mg | ORAL_TABLET | Freq: Four times a day (QID) | ORAL | Status: DC | PRN
  Filled 2013-06-16: qty 2

## 2013-06-16 MED ORDER — DOCUSATE SODIUM 100 MG PO CAPS: 100.0000 mg | ORAL_CAPSULE | Freq: Two times a day (BID) | ORAL | Status: DC

## 2013-06-16 MED ORDER — HYDROMORPHONE HCL PF 1 MG/ML IJ SOLN
1.0000 mg | Freq: Once | INTRAMUSCULAR | Status: AC
  Administered 2013-06-16: 1 mg via INTRAVENOUS
  Filled 2013-06-16: qty 1

## 2013-06-16 MED ORDER — TIOTROPIUM BROMIDE MONOHYDRATE 18 MCG IN CAPS
18.0000 ug | ORAL_CAPSULE | Freq: Every day | RESPIRATORY_TRACT | Status: DC
  Administered 2013-06-17 – 2013-06-19 (×3): 18 ug via RESPIRATORY_TRACT
  Filled 2013-06-16: qty 5

## 2013-06-16 MED ORDER — ALBUTEROL SULFATE (2.5 MG/3ML) 0.083% IN NEBU: 2.5000 mg | INHALATION_SOLUTION | RESPIRATORY_TRACT | Status: DC | PRN

## 2013-06-16 MED ORDER — POLYETHYLENE GLYCOL 3350 17 G PO PACK
17.0000 g | PACK | Freq: Every day | ORAL | Status: DC | PRN
  Filled 2013-06-16: qty 1
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
## 2013-06-16 NOTE — ED Provider Notes (Signed)
CSN: [id number]     Arrival date & time 06/16/13  1252 History   First MD Initiated Contact with Patient 06/16/13 1430     No chief complaint on file.  (Consider location/radiation/quality/duration/timing/severity/associated sxs/prior Treatment) HPI Comments: 68 year old male with a history of hypertension, CAD, depression, anxiety, GERD, COPD, multiple arthroplasties including shoulder, knee and back surgery. The patient lost his voice several weeks ago after his second back surgery. He is only able to whisper the history comes from patient and his son who is his caretaker. Last night he had acute episode of confusion and disorientation. Upon awakening this morning he stated he was a week Monday, confused and disoriented for an undetermined amount of time. The physical therapist saw this morning and noticed a change in his mentation as well as motor weakness. Although he is usually able to propel himself through the house in a wheelchair he was unable to move even for short distances for a period time this morning.   Past Medical History  Diagnosis Date  . Hx of colonic polyps   . Diverticulosis   . Hypertension   . 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 CAD (coronary artery disease)     minimal coronary plaque in the LAD and right coronary system. 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 7-06. Last nuclear stress 11-17-06 fixed anterior/ inferior defect, no inducible ischemia, EF 81%  . Allergic rhinitis   . Hx of colonoscopy   . PONV (postoperative nausea and vomiting)   . Dysrhythmia 01-24-12    past hx. A.Fib x1 episode-responded to med.  . Depression   . Anxiety     pt. feels its related to the medicine that he takes   . GERD (gastroesophageal reflux disease)   . COPD (chronic obstructive pulmonary disease)     pt denies this hx on 05/24/2013  . Osteoarthritis   . RA  (rheumatoid arthritis)   . Chronic back pain    Past Surgical History  Procedure Laterality Date  . Appendectomy    . Total knee arthroplasty Right 2002     partial  . Shoulder hemi-arthroplasty Left 2008    partial  . Foot surgery Right 11-08    for removal of bone spurs-  . Ankle reconstruction Right 6-09    [facility]  . Posterior lumbar fusion  12-10    L4-5 diskectomy w/ fusion, cage placement and rods; [facility]  . Partial knee arthroplasty Left   . Total shoulder replacement Right   . Joint replacement    . Cataract extraction, bilateral Bilateral 01-24-12  . Orif shoulder fracture  02/06/2012    Procedure: OPEN REDUCTION INTERNAL FIXATION (ORIF) SHOULDER FRACTURE;  Surgeon: Nita Sells, MD;  Location: [facility];  Service: Orthopedics;  Laterality: Left;  ORIF of a Left Shoulder Fracture with  Iliac Crest Bone Graft aspiration   . Harvest bone graft  02/06/2012    Procedure: HARVEST ILIAC BONE GRAFT;  Surgeon: Nita Sells, MD;  Location: [facility];  Service: Orthopedics;;  bone marrow aspirqation   . Hardware removal  03/09/2012    Procedure: HARDWARE REMOVAL;  Surgeon: Nita Sells, MD;  Location: [place];  Service: Orthopedics;  Laterality: Left;  Hardware Removal from Left Shoulder  . Shoulder arthroscopy Right 2013  . Carpal tunnel release Right 1990's  . Total ankle replacement Right 2008  at [facility]  . Hammer toe surgery Right 07/2012    "broke 4 hammertoes"   . Posterior lumbar fusion 4 level N/A 03/02/2013    Procedure: Lumbar One to Sacral One Posterior lumbar interbody fusion;  Surgeon: Floyce Stakes, MD;  Location: [place] [facility];  Service: Neurosurgery;  Laterality: N/A;  L1 to S1 Posterior lumbar interbody fusion  . Lumbar wound debridement N/A 03/17/2013    Procedure: Incision and drainage of superficial lumbar wound;  Surgeon: Floyce Stakes, MD;  Location: [place] [facility];  Service: Neurosurgery;  Laterality: N/A;   Incision and drainage of superficial lumbar wound  . Tonsillectomy    . Hernia repair Bilateral   . Coronary angioplasty with stent placement  2006    "while repairing 1st stent, a second area tore and they had to place 2nd stent " ?LAD & CX   Family History  Problem Relation Age of Onset  . Uterine cancer Mother     survivor  . Macular degeneration Mother   . Other Mother     ankle edema  . Lung cancer Mother   . Cancer Mother     ovarian, lung  . Coronary artery disease Father   . Hypertension Father   . Prostate cancer Father   . Colon polyps Father   . Cancer Father     prostate  . Thyroid disease Sister   . Heart attack Brother   . Hyperlipidemia Brother   . Other Brother     Schizophrenic  . Diabetes Neg Hx   . Colon cancer Neg Hx   . Esophageal cancer Neg Hx   . Rectal cancer Neg Hx   . Stomach cancer Neg Hx   . Coronary artery disease Maternal Aunt   . Heart attack Maternal Aunt   . Rheum arthritis Sister   . Hemochromatosis Sister    History  Substance Use Topics  . Smoking status: Never Smoker   . Smokeless tobacco: Never Used  . Alcohol Use: No    Review of Systems  Constitutional: Positive for activity change and fatigue. Negative for fever.  HENT: Positive for voice change.   Respiratory: Negative.   Cardiovascular: Negative.   Gastrointestinal: Negative.   Genitourinary:       Patient states he urinated on himself this morning but was accidental as opposed to incontinence.  Neurological: Positive for weakness and headaches. Negative for dizziness and syncope.  Psychiatric/Behavioral: Positive for confusion.    Allergies  Morphine and related; Penicillins; Cefixime; and Percocet  Home Medications   Current Outpatient Rx  Name  Route  Sig  Dispense  Refill  . Aclidinium Bromide (TUDORZA PRESSAIR) 400 MCG/ACT AEPB   Inhalation   Inhale 1 puff into the lungs 2 (two) times daily.   1 each   11   . Aclidinium Bromide 400 MCG/ACT AEPB    Inhalation   Inhale 2 puffs into the lungs at bedtime.         . ALPRAZolam (XANAX) 0.5 MG tablet   Oral   Take 1 tablet (0.5 mg total) by mouth 3 (three) times daily as needed for anxiety or sleep.   90 tablet   5   . aspirin (ASPIRIN EC) 81 MG EC tablet   Oral   Take 1 tablet (81 mg total) by mouth every morning.   30 tablet   12   . cholecalciferol (VITAMIN D) 1000 UNITS tablet   Oral   Take 1 tablet (1,000 Units total)  by mouth daily.   100 tablet   3   . docusate sodium (COLACE) 100 MG capsule   Oral   Take 1 capsule (100 mg total) by mouth 2 (two) times daily.   60 capsule   0   . FLUoxetine (PROZAC) 40 MG capsule   Oral   Take 1 capsule (40 mg total) by mouth 2 (two) times daily.   60 capsule   3   . fluticasone (FLONASE) 50 MCG/ACT nasal spray   Each Nare   Place 2 sprays into both nostrils at bedtime.          . gabapentin (NEURONTIN) 300 MG capsule   Oral   Take 2 capsules (600 mg total) by mouth 2 (two) times daily.   60 capsule   1   . guaiFENesin (MUCINEX) 600 MG 12 hr tablet   Oral   Take 2 tablets (1,200 mg total) by mouth 2 (two) times daily.         [name] HYDROmorphone (DILAUDID) 2 MG tablet   Oral   Take 1 tablet (2 mg total) by mouth every 6 (six) hours as needed for moderate pain or severe pain (Hold if lethargy and sedation).   90 tablet   0   . Linaclotide (LINZESS) 290 MCG CAPS capsule   Oral   Take 1 capsule (290 mcg total) by mouth daily.   30 capsule   0   . losartan (COZAAR) 50 MG tablet   Oral   Take 1 tablet (50 mg total) by mouth daily.   30 tablet   1   . methocarbamol (ROBAXIN) 500 MG tablet   Oral   Take 1 tablet (500 mg total) by mouth every 6 (six) hours as needed for muscle spasms.   90 tablet   0   . metoprolol tartrate (LOPRESSOR) 25 MG tablet   Oral   Take 1 tablet (25 mg total) by mouth 2 (two) times daily.   60 tablet   1   . OxyCODONE (OXYCONTIN) 10 mg T12A 12 hr tablet   Oral   Take 1 tablet  (10 mg total) by mouth every 12 (twelve) hours.   42 tablet   0   . pantoprazole (PROTONIX) 20 MG tablet      TAKE 2 TABLETS BY MOUTH TWICE A DAY   120 tablet   3   . Polyethyl Glycol-Propyl Glycol (SYSTANE ULTRA) 0.4-0.3 % SOLN   Both Eyes   Place 2 drops into both eyes 2 (two) times daily as needed (dry eyes).          . polyethylene glycol (MIRALAX / GLYCOLAX) packet   Oral   Take 17 g by mouth daily as needed for moderate constipation.   14 each   1   . predniSONE (DELTASONE) 5 MG tablet   Oral   Take 1 tablet (5 mg total) by mouth every evening.   30 tablet   1   . simvastatin (ZOCOR) 40 MG tablet   Oral   Take 1 tablet (40 mg total) by mouth daily with lunch.   30 tablet   1    BP 144/76  Pulse 102  Temp(Src) 97.4 F (36.3 C) (Oral)  Resp 21  SpO2 99% Physical Exam  Nursing note and vitals reviewed. Constitutional: He is oriented to person, place, and time. He appears well-developed and well-nourished.  Eyes: Conjunctivae and EOM are normal.  Neck: Normal range of motion. Neck supple.  Cardiovascular: Normal  rate, regular rhythm and normal heart sounds.   Pulmonary/Chest: Effort normal. No respiratory distress. He has no wheezes. He has rales.  Crackles in the left mid field.  Musculoskeletal: He exhibits no edema.  Neurological: He is alert and oriented to person, place, and time.  Skin: Skin is warm and dry.  Psychiatric: He has a normal mood and affect.    ED Course  Procedures (including critical care time) Labs Review Labs Reviewed  POCT URINALYSIS DIP (DEVICE)   Imaging Review No results found.   MDM   1. Transient disorientation   2. Confusional state   3. Polypharmacy   4. Headache      55 Y O M  With multiple, chronic complex diagnoses being transferred to ED for acute onset of confusion, disorientation, motor weakness last PM and this AM. Headache for 1 week. Sx's improved this PM.     John Napoleon, NP 06/16/13 1517

## 2013-06-16 NOTE — ED Notes (Addendum)
Pt  Has  Frequent   Urination           Foul  Odor       While at PT  This  Am    Was  Advised  That  Might  Have  A  uti        Son  whio  Is  Caretaker  States  He  Was  Slightly  Confused  This  Am      -   Pt    Also  Has been  C/o  Headache  As  Well

## 2013-06-16 NOTE — ED Provider Notes (Signed)
CSN: [id number]     Arrival date & time 06/16/13  1556 History   First MD Initiated Contact with Patient 06/16/13 1632     Chief Complaint  Patient presents with  . Altered Mental Status     (Consider location/radiation/quality/duration/timing/severity/associated sxs/prior Treatment) Patient is a 68 y.o. male presenting with altered mental status. The history is provided by the patient and medical records.  Altered Mental Status  Level 5 Caveat:  AMS-- hx provided by son at bedside. This is a 68 year old male with past history significant for hypertension, coronary artery disease, chronic back pain with recent L1-S1 fusion on 03/02/13, presenting to the ED with son for altered mental status.  Son states patient was released from the hospital a few days ago but has overall been doing well other than complaining of a mild headache and some generalized weakness.  Pt was given a dose of benadryl last night for headache.  Son states upon waking this morning, patient was very disoriented and confused.  States he sat at the table and could not feed himself and was making no attempt to walk or participate in his physical therapy.  Son denies any recent changes in medications (other than dose of benadryl). Patient does note a nonproductive cough since being released from the hospital last week. He denies any fevers or chills. No urinary symptoms.  Son does note over the past several weeks patient has fallen multiple times with instances of head trauma, but does not recall having a head CT performed while in the hospital.  VS stable on arrival.  Past Medical History  Diagnosis Date  . Hx of colonic polyps   . Diverticulosis   . Hypertension   . 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 CAD (coronary artery disease)     minimal coronary plaque in the LAD and right coronary system. PCI of a 95% obtuse marginal lesion w/ resultant spiral dissection requiring  drug-eluting stent placement. 7-06. Last nuclear stress 11-17-06 fixed anterior/ inferior defect, no inducible ischemia, EF 81%  . Allergic rhinitis   . Hx of colonoscopy   . PONV (postoperative nausea and vomiting)   . Dysrhythmia 01-24-12    past hx. A.Fib x1 episode-responded to med.  . Depression   . Anxiety     pt. feels its related to the medicine that he takes   . GERD (gastroesophageal reflux disease)   . COPD (chronic obstructive pulmonary disease)     pt denies this hx on 05/24/2013  . Osteoarthritis   . RA (rheumatoid arthritis)   . Chronic back pain    Past Surgical History  Procedure Laterality Date  . Appendectomy    . Total knee arthroplasty Right 2002     partial  . Shoulder hemi-arthroplasty Left 2008    partial  . Foot surgery Right 11-08    for removal of bone spurs-  . Ankle reconstruction Right 6-09    [facility]  . Posterior lumbar fusion  12-10    L4-5 diskectomy w/ fusion, cage placement and rods; [facility]  . Partial knee arthroplasty Left   . Total shoulder replacement Right   . Joint replacement    . Cataract extraction, bilateral Bilateral 01-24-12  . Orif shoulder fracture  02/06/2012    Procedure: OPEN REDUCTION INTERNAL FIXATION (ORIF) SHOULDER FRACTURE;  Surgeon: Nita Sells, MD;  Location: [facility];  Service: Orthopedics;  Laterality: Left;  ORIF of  a Left Shoulder Fracture with  Iliac Crest Bone Graft aspiration   . Harvest bone graft  02/06/2012    Procedure: HARVEST ILIAC BONE GRAFT;  Surgeon: Nita Sells, MD;  Location: [facility];  Service: Orthopedics;;  bone marrow aspirqation   . Hardware removal  03/09/2012    Procedure: HARDWARE REMOVAL;  Surgeon: Nita Sells, MD;  Location: [place];  Service: Orthopedics;  Laterality: Left;  Hardware Removal from Left Shoulder  . Shoulder arthroscopy Right 2013  . Carpal tunnel release Right 1990's  . Total ankle replacement Right 2008    at [facility]  . Hammer toe  surgery Right 07/2012    "broke 4 hammertoes"   . Posterior lumbar fusion 4 level N/A 03/02/2013    Procedure: Lumbar One to Sacral One Posterior lumbar interbody fusion;  Surgeon: Floyce Stakes, MD;  Location: [place] [facility];  Service: Neurosurgery;  Laterality: N/A;  L1 to S1 Posterior lumbar interbody fusion  . Lumbar wound debridement N/A 03/17/2013    Procedure: Incision and drainage of superficial lumbar wound;  Surgeon: Floyce Stakes, MD;  Location: [place] [facility];  Service: Neurosurgery;  Laterality: N/A;  Incision and drainage of superficial lumbar wound  . Tonsillectomy    . Hernia repair Bilateral   . Coronary angioplasty with stent placement  2006    "while repairing 1st stent, a second area tore and they had to place 2nd stent " ?LAD & CX   Family History  Problem Relation Age of Onset  . Uterine cancer Mother     survivor  . Macular degeneration Mother   . Other Mother     ankle edema  . Lung cancer Mother   . Cancer Mother     ovarian, lung  . Coronary artery disease Father   . Hypertension Father   . Prostate cancer Father   . Colon polyps Father   . Cancer Father     prostate  . Thyroid disease Sister   . Heart attack Brother   . Hyperlipidemia Brother   . Other Brother     Schizophrenic  . Diabetes Neg Hx   . Colon cancer Neg Hx   . Esophageal cancer Neg Hx   . Rectal cancer Neg Hx   . Stomach cancer Neg Hx   . Coronary artery disease Maternal Aunt   . Heart attack Maternal Aunt   . Rheum arthritis Sister   . Hemochromatosis Sister    History  Substance Use Topics  . Smoking status: Never Smoker   . Smokeless tobacco: Never Used  . Alcohol Use: No    Review of Systems  Unable to perform ROS: Mental status change   Allergies  Morphine and related; Penicillins; Cefixime; and Percocet  Home Medications   Current Outpatient Rx  Name  Route  Sig  Dispense  Refill  . Aclidinium Bromide (TUDORZA PRESSAIR) 400 MCG/ACT AEPB   Inhalation   Inhale  1 puff into the lungs 2 (two) times daily.   1 each   11   . Aclidinium Bromide 400 MCG/ACT AEPB   Inhalation   Inhale 2 puffs into the lungs at bedtime.         . ALPRAZolam (XANAX) 0.5 MG tablet   Oral   Take 1 tablet (0.5 mg total) by mouth 3 (three) times daily as needed for anxiety or sleep.   90 tablet   5   . aspirin (ASPIRIN EC) 81 MG EC tablet  Oral   Take 1 tablet (81 mg total) by mouth every morning.   30 tablet   12   . cholecalciferol (VITAMIN D) 1000 UNITS tablet   Oral   Take 1 tablet (1,000 Units total) by mouth daily.   100 tablet   3   . docusate sodium (COLACE) 100 MG capsule   Oral   Take 1 capsule (100 mg total) by mouth 2 (two) times daily.   60 capsule   0   . FLUoxetine (PROZAC) 40 MG capsule   Oral   Take 1 capsule (40 mg total) by mouth 2 (two) times daily.   60 capsule   3   . fluticasone (FLONASE) 50 MCG/ACT nasal spray   Each Nare   Place 2 sprays into both nostrils at bedtime.          . gabapentin (NEURONTIN) 300 MG capsule   Oral   Take 2 capsules (600 mg total) by mouth 2 (two) times daily.   60 capsule   1   . guaiFENesin (MUCINEX) 600 MG 12 hr tablet   Oral   Take 2 tablets (1,200 mg total) by mouth 2 (two) times daily.         [name] HYDROmorphone (DILAUDID) 2 MG tablet   Oral   Take 1 tablet (2 mg total) by mouth every 6 (six) hours as needed for moderate pain or severe pain (Hold if lethargy and sedation).   90 tablet   0   . Linaclotide (LINZESS) 290 MCG CAPS capsule   Oral   Take 1 capsule (290 mcg total) by mouth daily.   30 capsule   0   . losartan (COZAAR) 50 MG tablet   Oral   Take 1 tablet (50 mg total) by mouth daily.   30 tablet   1   . methocarbamol (ROBAXIN) 500 MG tablet   Oral   Take 1 tablet (500 mg total) by mouth every 6 (six) hours as needed for muscle spasms.   90 tablet   0   . metoprolol tartrate (LOPRESSOR) 25 MG tablet   Oral   Take 1 tablet (25 mg total) by mouth 2 (two) times  daily.   60 tablet   1   . OxyCODONE (OXYCONTIN) 10 mg T12A 12 hr tablet   Oral   Take 1 tablet (10 mg total) by mouth every 12 (twelve) hours.   42 tablet   0   . pantoprazole (PROTONIX) 20 MG tablet      TAKE 2 TABLETS BY MOUTH TWICE A DAY   120 tablet   3   . Polyethyl Glycol-Propyl Glycol (SYSTANE ULTRA) 0.4-0.3 % SOLN   Both Eyes   Place 2 drops into both eyes 2 (two) times daily as needed (dry eyes).          . polyethylene glycol (MIRALAX / GLYCOLAX) packet   Oral   Take 17 g by mouth daily as needed for moderate constipation.   14 each   1   . predniSONE (DELTASONE) 5 MG tablet   Oral   Take 1 tablet (5 mg total) by mouth every evening.   30 tablet   1   . simvastatin (ZOCOR) 40 MG tablet   Oral   Take 1 tablet (40 mg total) by mouth daily with lunch.   30 tablet   1    BP 157/95  Pulse 80  Temp(Src) 98.5 F (36.9 C) (Oral)  Resp 20  SpO2 100%  Physical Exam  Nursing note and vitals reviewed. Constitutional: He appears well-developed and well-nourished. No distress.  HENT:  Head: Normocephalic and atraumatic.  Mouth/Throat: Uvula is midline, oropharynx is clear and moist and mucous membranes are normal. No oropharyngeal exudate, posterior oropharyngeal edema, posterior oropharyngeal erythema or tonsillar abscesses.  Hoarse whisper  Eyes: Conjunctivae and EOM are normal. Pupils are equal, round, and reactive to light.  Neck: Normal range of motion.  Cardiovascular: Normal rate, regular rhythm and normal heart sounds.   Pulmonary/Chest: Effort normal and breath sounds normal. No respiratory distress. He has no wheezes.  Abdominal: Soft. Bowel sounds are normal. There is no tenderness. There is no guarding.  Mild bruising to right lower abdomen and flank; no tenderness  Musculoskeletal: Normal range of motion. He exhibits no edema.  Lumbar midline surgical incision appears clean with minimal drainage, no surrounding erythema or cellulitic change   Neurological: He is alert.  Alert, oriented to self and time only; equal strength UE and LE bilaterally; CN grossly intact; following commands and answering most questions appropriately; moves all extremities appropriately without ataxia; no focal neuro deficits or facial asymmetry appreciated  Skin: Skin is warm and dry. He is not diaphoretic.  Psychiatric: He has a normal mood and affect.    ED Course  Procedures (including critical care time) Labs Review Labs Reviewed  CBC - Abnormal; Notable for the following:    WBC 14.7 (*)    HCT 37.3 (*)    RDW 16.8 (*)    All other components within normal limits  COMPREHENSIVE METABOLIC PANEL - Abnormal; Notable for the following:    Sodium 126 (*)    Chloride 89 (*)    Glucose, Bld 106 (*)    Albumin 3.3 (*)    All other components within normal limits  SEDIMENTATION RATE - Abnormal; Notable for the following:    Sed Rate 20 (*)    All other components within normal limits  URINALYSIS, ROUTINE W REFLEX MICROSCOPIC  BASIC METABOLIC PANEL  CBC  SEDIMENTATION RATE  I-STAT CG4 LACTIC ACID, ED  I-STAT TROPOININ, ED   Imaging Review Ct Head Wo Contrast  06/16/2013   CLINICAL DATA Confusion with headache.  EXAM CT HEAD WITHOUT CONTRAST  TECHNIQUE Contiguous axial images were obtained from the base of the skull through the vertex without contrast.  COMPARISON 05/06/2013.  FINDINGS Motion degraded exam.  Study was repeated.  Overall diagnostic.  Moderate atrophy and chronic microvascular ischemic change. No acute stroke, hemorrhage, mass lesion, hydrocephalus, or extraction fluid. Calvarium intact. No sinus or mastoid disease. Negative orbits. Stable appearance from priors.  IMPRESSION Chronic changes as described.  No acute intracranial abnormality.  SIGNATURE  Electronically Signed   By: Rolla Flatten M.D.   On: 06/16/2013 17:20   Dg Chest Port 1 View  06/16/2013   CLINICAL DATA Altered mental status  EXAM PORTABLE CHEST - 1 VIEW  COMPARISON  06/01/2013  FINDINGS Mild cardiomegaly which is chronic. Long coronary artery stent noted. There is chronic elevation the right diaphragm. Streaky retrocardiac opacity which is stable from prior and most consistent with scarring. No definite consolidation, edema, effusion, or pneumothorax. Chronic elevation the right diaphragm. Bilateral glenohumeral arthroplasty, total on the right. There is remodeling of the glenoid left.  IMPRESSION 1. No evidence of edema or pneumonia. 2. Stable, mild scarring at the left base.  SIGNATURE  Electronically Signed   By: Jorje Guild M.D.   On: 06/16/2013 18:19     EKG Interpretation  None      MDM   Final diagnoses:  Altered mental status   68 year old male with altered mental status beginning this am. Patient had recent back surgeries and has continued having persistent headaches. He was hospitalized last week to have fluoroscopy guided LP which was negative for infection.  On initial evaluation, pt is awake and alert, but is not baseline oriented.  He appears generally weak but no focal neuro deficits were elicited.  Lumbar incision appears clean without signs of external infection.  Will initiate AMS work up including CT head, CXR, EKG, trop, CBC, CMP, u/a.  Will continue to monitor closely.  CT head negative for acute findings. Chest x-ray is clear.  UA without infection.  Hyponatremia noted at 126. Leukocytosis of 14.7 (was normal last week at time of discharge).  Consulted with neurosurgery, Dr. Sherwood Gambler-- does not recommend further neurosurgery work-up at this time given recent negative seroma bx from lumbar spine.  Pt admitted to hospitalist for further management.  Larene Pickett, PA-C 06/17/13 0025

## 2013-06-16 NOTE — H&P (Signed)
[facility]                                                                                    Patient Demographics  [name], is a 68 y.o. male  CSN: [id number]  MRN: [id number]  DOB - [birth year]  Admit Date - 06/16/2013  Outpatient Primary MD for the patient is Adella Hare, MD   With History of -  Past Medical History  Diagnosis Date  . Hx of colonic polyps   . Diverticulosis   . Hypertension   . 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 CAD (coronary artery disease)     minimal coronary plaque in the LAD and right coronary system. 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 7-06. Last nuclear stress 11-17-06 fixed anterior/ inferior defect, no inducible ischemia, EF 81%  . Allergic rhinitis   . Hx of colonoscopy   . PONV (postoperative nausea and vomiting)   . Dysrhythmia 01-24-12    past hx. A.Fib x1 episode-responded to med.  . Depression   . Anxiety     pt. feels its related to the medicine that he takes   . GERD (gastroesophageal reflux disease)   . COPD (chronic obstructive pulmonary disease)     pt denies this hx on 05/24/2013  . Osteoarthritis   . RA (rheumatoid arthritis)   . Chronic back pain       Past Surgical History  Procedure Laterality Date  . Appendectomy    . Total knee arthroplasty Right 2002     partial  . Shoulder hemi-arthroplasty Left 2008    partial  . Foot surgery Right 11-08    for removal of bone spurs-  . Ankle reconstruction Right 6-09    [facility]  . Posterior lumbar fusion  12-10    L4-5 diskectomy w/ fusion, cage placement and rods; [place]  . Partial knee arthroplasty Left   . Total shoulder replacement Right   . Joint replacement    . Cataract extraction, bilateral Bilateral 01-24-12  . Orif shoulder fracture  02/06/2012    Procedure: OPEN REDUCTION INTERNAL FIXATION (ORIF) SHOULDER FRACTURE;   Surgeon: Nita Sells, MD;  Location: [facility];  Service: Orthopedics;  Laterality: Left;  ORIF of a Left Shoulder Fracture with  Iliac Crest Bone Graft aspiration   . Harvest bone graft  02/06/2012    Procedure: HARVEST ILIAC BONE GRAFT;  Surgeon: Nita Sells, MD;  Location: [facility];  Service: Orthopedics;;  bone marrow aspirqation   . Hardware removal  03/09/2012    Procedure: HARDWARE REMOVAL;  Surgeon: Nita Sells, MD;  Location: [place];  Service: Orthopedics;  Laterality: Left;  Hardware Removal from Left Shoulder  . Shoulder arthroscopy Right 2013  . Carpal tunnel release Right 1990's  . Total ankle replacement Right 2008    at [facility]  . Hammer toe surgery Right 07/2012    "broke 4 hammertoes"   . Posterior lumbar fusion 4 level N/A 03/02/2013    Procedure: Lumbar One to Sacral One Posterior  lumbar interbody fusion;  Surgeon: Floyce Stakes, MD;  Location: [place] [facility];  Service: Neurosurgery;  Laterality: N/A;  L1 to S1 Posterior lumbar interbody fusion  . Lumbar wound debridement N/A 03/17/2013    Procedure: Incision and drainage of superficial lumbar wound;  Surgeon: Floyce Stakes, MD;  Location: [place] [facility];  Service: Neurosurgery;  Laterality: N/A;  Incision and drainage of superficial lumbar wound  . Tonsillectomy    . Hernia repair Bilateral   . Coronary angioplasty with stent placement  2006    "while repairing 1st stent, a second area tore and they had to place 2nd stent " ?LAD & CX    in for   Chief Complaint  Patient presents with  . Altered Mental Status     HPI  [name]  is a 68 y.o. male, L1-S1 fusion, lumbar surgery recently discharged from the hospital after he was ruled out for an infection presenting with altered mental status.According to the son the patient does good when he is on the hospital, but when he goes home his situation gets worse. The patient has recently started living with his son who  monitors his pain medications . Yesterday the patient was complaining of headaches and he received Benadryl and today his mental status got worse and did not want to participate in any physical therapy. He had history of falls. CT of the head in the ER was negative however his white blood cell count was found to be elevated I was called to admit for possible infection. His chest x-ray and urine were both negative . Patient denies any pain in his back and his family reports that the wound has been clean  Review of Systems    In addition to the HPI above,  No Fever-chills, No Headache, No changes with Vision or hearing, No problems swallowing food or Liquids, No Chest pain, Cough or Shortness of Breath, No Abdominal pain, No Nausea or Vommitting, Bowel movements are regular, No Blood in stool or Urine, No dysuria, No new skin rashes or bruises, No new joints pains-aches,  No recent weight gain or loss, No polyuria, polydypsia or polyphagia, No significant Mental Stressors.  A full 10 point Review of Systems was done, except as stated above, all other Review of Systems were negative.   Social History History  Substance Use Topics  . Smoking status: Never Smoker   . Smokeless tobacco: Never Used  . Alcohol Use: No     Family History Family History  Problem Relation Age of Onset  . Uterine cancer Mother     survivor  . Macular degeneration Mother   . Other Mother     ankle edema  . Lung cancer Mother   . Cancer Mother     ovarian, lung  . Coronary artery disease Father   . Hypertension Father   . Prostate cancer Father   . Colon polyps Father   . Cancer Father     prostate  . Thyroid disease Sister   . Heart attack Brother   . Hyperlipidemia Brother   . Other Brother     Schizophrenic  . Diabetes Neg Hx   . Colon cancer Neg Hx   . Esophageal cancer Neg Hx   . Rectal cancer Neg Hx   . Stomach cancer Neg Hx   . Coronary artery disease Maternal Aunt   . Heart attack  Maternal Aunt   . Rheum arthritis Sister   . Hemochromatosis Sister  Prior to Admission medications   Medication Sig Start Date End Date Taking? Authorizing Provider  Aclidinium Bromide (TUDORZA PRESSAIR) 400 MCG/ACT AEPB Inhale 1 puff into the lungs 2 (two) times daily. 01/23/12  Yes Neena Rhymes, MD  ALPRAZolam Duanne Moron) 0.5 MG tablet Take 1 tablet (0.5 mg total) by mouth 3 (three) times daily as needed for anxiety or sleep. 06/10/13  Yes Daniel J Angiulli, PA-C  aspirin (ASPIRIN EC) 81 MG EC tablet Take 1 tablet (81 mg total) by mouth every morning. 06/10/13  Yes Daniel J Angiulli, PA-C  cholecalciferol (VITAMIN D) 1000 UNITS tablet Take 1 tablet (1,000 Units total) by mouth daily. 06/10/13  Yes Daniel J Angiulli, PA-C  docusate sodium (COLACE) 100 MG capsule Take 1 capsule (100 mg total) by mouth 2 (two) times daily. 04/24/13  Yes Bonnielee Haff, MD  FLUoxetine (PROZAC) 40 MG capsule Take 1 capsule (40 mg total) by mouth 2 (two) times daily. 06/10/13  Yes Daniel J Angiulli, PA-C  fluticasone (FLONASE) 50 MCG/ACT nasal spray Place 2 sprays into both nostrils at bedtime.    Yes Historical Provider, MD  gabapentin (NEURONTIN) 300 MG capsule Take 2 capsules (600 mg total) by mouth 2 (two) times daily. 06/10/13  Yes Daniel J Angiulli, PA-C  guaiFENesin (MUCINEX) 600 MG 12 hr tablet Take 2 tablets (1,200 mg total) by mouth 2 (two) times daily. 06/10/13  Yes Daniel J Angiulli, PA-C  HYDROmorphone (DILAUDID) 2 MG tablet Take 1 tablet (2 mg total) by mouth every 6 (six) hours as needed for moderate pain or severe pain (Hold if lethargy and sedation). 06/10/13  Yes Daniel J Angiulli, PA-C  Linaclotide (LINZESS) 290 MCG CAPS capsule Take 1 capsule (290 mcg total) by mouth daily. 06/10/13  Yes Daniel J Angiulli, PA-C  losartan (COZAAR) 50 MG tablet Take 1 tablet (50 mg total) by mouth daily. 06/10/13  Yes Daniel J Angiulli, PA-C  methocarbamol (ROBAXIN) 500 MG tablet Take 1 tablet (500 mg total) by mouth every 6  (six) hours as needed for muscle spasms. 06/10/13  Yes Daniel J Angiulli, PA-C  metoprolol tartrate (LOPRESSOR) 25 MG tablet Take 1 tablet (25 mg total) by mouth 2 (two) times daily. 06/10/13  Yes Daniel J Angiulli, PA-C  OxyCODONE (OXYCONTIN) 10 mg T12A 12 hr tablet Take 1 tablet (10 mg total) by mouth every 12 (twelve) hours. 06/10/13  Yes Daniel J Angiulli, PA-C  pantoprazole (PROTONIX) 20 MG tablet TAKE 2 TABLETS BY MOUTH TWICE A DAY   Yes Neena Rhymes, MD  Polyethyl Glycol-Propyl Glycol (SYSTANE ULTRA) 0.4-0.3 % SOLN Place 2 drops into both eyes 2 (two) times daily as needed (dry eyes).    Yes Historical Provider, MD  polyethylene glycol (MIRALAX / GLYCOLAX) packet Take 17 g by mouth daily as needed for moderate constipation. 04/24/13  Yes Bonnielee Haff, MD  predniSONE (DELTASONE) 5 MG tablet Take 1 tablet (5 mg total) by mouth every evening. 06/10/13  Yes Daniel J Angiulli, PA-C  simvastatin (ZOCOR) 40 MG tablet Take 1 tablet (40 mg total) by mouth daily with lunch. 06/10/13  Yes Daniel J Angiulli, PA-C    Allergies  Allergen Reactions  . Morphine And Related Shortness Of Breath, Nausea And Vomiting, Swelling and Other (See Comments)    Agitation, tolerates dilaudid  . Penicillins Hives, Shortness Of Breath and Rash    REACTION: hives, breathing problems  . Cefixime     rash  . Percocet [Oxycodone-Acetaminophen] Anxiety    Takes OxyContin at home.  Physical Exam  Vitals  Blood pressure 151/89, pulse 77, temperature 98.5 F (36.9 C), temperature source Oral, resp. rate 22, SpO2 97.00%.   1. General elderly white gentleman, very pleasant in no acute distress, pleasantly confused  2. Normal affect and insight, Not Suicidal or Homicidal, .  3. No F.N deficits, ALL C.Nerves Intact, Strength 5/5 all 4 extremities, Sensation intact all 4 extremities, Plantars down going.  4. Ears and Eyes appear Normal, Conjunctivae clear, PERRLA. Moist Oral Mucosa.  5. Supple Neck, No JVD, No  cervical lymphadenopathy appriciated, No Carotid Bruits.  6. Symmetrical Chest wall movement, Good air movement bilaterally, CTAB.  7. RRR, No Gallops, Rubs or Murmurs, No Parasternal Heave.  8. Positive Bowel Sounds, Abdomen Soft, Non tender, No organomegaly appriciated,No rebound -guarding or rigidity.  9.  No Cyanosis, Normal Skin Turgor, No Skin Rash or Bruise.  10. Good muscle tone,  joints appear normal , no effusions, Normal ROM.  11. Back wound looks clean with no tenderness    Data Review  CBC  Recent Labs Lab 06/16/13 1614  WBC 14.7*  HGB 13.0  HCT 37.3*  PLT 212  MCV 85.7  MCH 29.9  MCHC 34.9  RDW 16.8*   ------------------------------------------------------------------------------------------------------------------  Chemistries   Recent Labs Lab 06/16/13 1614  NA 126*  K 4.2  CL 89*  CO2 23  GLUCOSE 106*  BUN 21  CREATININE 0.54  CALCIUM 9.0  AST 16  ALT 9  ALKPHOS 53  BILITOT 0.9   ------------------------------------------------------------------------------------------------------------------ CrCl is unknown because both a height and weight (above a minimum accepted value) are required for this calculation. ------------------------------------------------------------------------------------------------------------------ No results found for this basename: TSH, T4TOTAL, FREET3, T3FREE, THYROIDAB,  in the last 72 hours   Coagulation profile No results found for this basename: INR, PROTIME,  in the last 168 hours ------------------------------------------------------------------------------------------------------------------- No results found for this basename: DDIMER,  in the last 72 hours -------------------------------------------------------------------------------------------------------------------  Cardiac Enzymes No results found for this basename: CK, CKMB, TROPONINI, MYOGLOBIN,  in the last 168  hours ------------------------------------------------------------------------------------------------------------------ No components found with this basename: POCBNP,    ---------------------------------------------------------------------------------------------------------------  Urinalysis    Component Value Date/Time   COLORURINE YELLOW 06/16/2013 1736   APPEARANCEUR CLEAR 06/16/2013 1736   LABSPEC 1.023 06/16/2013 1736   PHURINE 6.5 06/16/2013 1736   GLUCOSEU NEGATIVE 06/16/2013 1736   HGBUR NEGATIVE 06/16/2013 1736   BILIRUBINUR NEGATIVE 06/16/2013 1736   KETONESUR NEGATIVE 06/16/2013 1736   PROTEINUR NEGATIVE 06/16/2013 1736   UROBILINOGEN 1.0 06/16/2013 1736   NITRITE NEGATIVE 06/16/2013 1736   LEUKOCYTESUR NEGATIVE 06/16/2013 1736    ----------------------------------------------------------------------------------------------------------------  ABG     Imaging results:   Dg Chest 2 View  06/01/2013   CLINICAL DATA:  Cough, congestion and fever.  EXAM: CHEST  2 VIEW  COMPARISON:  05/30/2013  FINDINGS: Elevation the right hemidiaphragm is stable. Mild lung base opacity is likely atelectasis, scarring or a combination. It is also stable. Lungs are otherwise clear. No pleural effusion or pneumothorax.  Cardiac silhouette is normal in size.  Bilateral shoulder prostheses are stable. Bony thorax is diffusely demineralized.  IMPRESSION: No acute cardiopulmonary disease. Stable appearance from the recent prior study.   Electronically Signed   By: Lajean Manes M.D.   On: 06/01/2013 15:05   Dg Chest 2 View  05/30/2013   CLINICAL DATA:  Fever.  Cough.  EXAM: CHEST  2 VIEW  COMPARISON:  05/26/2013  FINDINGS: Low lung volumes again seen with chronic elevation of right hemidiaphragm. Mild scarring  in left lung base is stable. No evidence of acute infiltrate or edema. No evidence of pleural effusion. Heart size remains stable. Bilateral shoulder prostheses again seen as well as severe thoracic  spine degenerative changes.  IMPRESSION: Stable low lung volumes and left basilar scarring. No acute findings.   Electronically Signed   By: Earle Gell M.D.   On: 05/30/2013 10:42   Dg Chest 2 View  05/26/2013   CLINICAL DATA:  Shortness of breath and cough.  EXAM: CHEST  2 VIEW  COMPARISON:  DG CHEST 1V PORT dated 05/23/2013; DG CHEST 2 VIEW dated 05/08/2013; DG CHEST 2 VIEW dated 04/12/2013; CT ABD/PELV WO CM dated 08/11/2012  FINDINGS: Mediastinum unremarkable. Poor inspiration with basilar atelectasis. Prominent elevation of the right hemidiaphragm again noted. A catheter or coronary stent noted over the left heart. No pneumothorax. Bilateral shoulder replacements. Prior lumbar spine fusion.  IMPRESSION: Stable chest x-ray with poor inspiration and basilar atelectasis. Stable elevation right hemidiaphragm.   Electronically Signed   By: [place]   On: 05/26/2013 17:10   Dg Abd 1 View  05/29/2013   CLINICAL DATA:  Abdominal distention and pain. Nausea. Constipation.  EXAM: ABDOMEN - 1 VIEW  COMPARISON:  05/20/2013  FINDINGS: No evidence of dilated bowel loops. Decreased colonic stool seen since previous study. Lumbar spine fusion hardware again noted.  IMPRESSION: Decreased stool burden since previous study.  No acute findings.   Electronically Signed   By: Earle Gell M.D.   On: 05/29/2013 19:11   Ct Head Wo Contrast  06/16/2013   CLINICAL DATA Confusion with headache.  EXAM CT HEAD WITHOUT CONTRAST  TECHNIQUE Contiguous axial images were obtained from the base of the skull through the vertex without contrast.  COMPARISON 05/06/2013.  FINDINGS Motion degraded exam.  Study was repeated.  Overall diagnostic.  Moderate atrophy and chronic microvascular ischemic change. No acute stroke, hemorrhage, mass lesion, hydrocephalus, or extraction fluid. Calvarium intact. No sinus or mastoid disease. Negative orbits. Stable appearance from priors.  IMPRESSION Chronic changes as described.  No acute intracranial  abnormality.  SIGNATURE  Electronically Signed   By: Rolla Flatten M.D.   On: 06/16/2013 17:20   Mr Lumbar Spine W Wo Contrast  05/31/2013   CLINICAL DATA:  Fever, evaluate for postoperative infection.  EXAM: MRI LUMBAR SPINE WITHOUT AND WITH CONTRAST  TECHNIQUE: Multiplanar and multiecho pulse sequences of the lumbar spine were obtained without and with intravenous contrast.  CONTRAST:  51m MULTIHANCE GADOBENATE DIMEGLUMINE 529 MG/ML IV SOLN  COMPARISON:  DG ABD 1 VIEW dated 05/29/2013; DG LUMBAR SPINE 2-3V dated 05/20/2013  FINDINGS: The patient is status post L1 through S1 bilateral pedicle screw and rod fixation with bridging rod at L4. L4-5 interbody disc fusion material. Grade 1 L4-5 anterolisthesis, with severe degenerative disc disease of the non surgically altered discs. Moderate acute on chronic discogenic endplate changes at LG2-B6 with more chronic appearing moderate to severe chronic discogenic endplate changes LL8-9 moderate L2-3 and L4-5. No suspicious osseous or intradiscal enhancement.  Bright T2, low T1 nonenhancing paraspinal 3 x 3.8 x 10.6 cm (AP by transverse by cc) fluid collection along the surgical approach. Paraspinal muscle denervation without suspicious enhancement of the paraspinal muscles.  Conus medullaris terminates at L1 and appears normal in morphology and signal characteristics. Cauda equina is unremarkable, specifically no nerve root clumping. No abnormal cord, leptomeningeal nor epidural enhancement. Partially imaged colonic diverticulosis. Right renal parapelvic cyst.  Level by level evaluation (please though, evaluation of  the neural foramen limited by susceptibility hardware artifact):  T12-L1: No disc bulge, canal stenosis nor neural foraminal narrowing. Please note, the left L1 pedicle screw appears to traverse the left lateral recess, axial 6/36.  L1-2: Posterior decompression, no canal stenosis or definite neural foraminal narrowing.  L2-3: Small broad-based disc  osteophyte complex, status post posterior decompression without canal stenosis. No definite neural foraminal narrowing.  L3-4: Small broad-based disc osteophyte complex, status post posterior decompression without canal stenosis. No definite neural foraminal narrowing.  L4-5: Anterolisthesis. Posterior decompression without canal stenosis. Suspected mild right neural foraminal narrowing.  L5-S1: Transitional anatomy, sacralized L5 vertebral body. Posterior decompression without canal stenosis, at this level may be acute. Left central 6 mm contiguous disc extrusion, status post possible discectomy, there is posterior talus placement of the traversing left S1 nerve with enhancing T2 bright presumed early granulation tissue within the left lateral recess. Enhancing edematous left S1 nerve.  IMPRESSION: No MR findings of infection within the lumbar spine.  Status post L1 through L5 laminectomies with posterior instrumentation. Paraspinal fluid collection along the surgical approach favors seroma, and is unlikely to reflect pseudomeningocele.  Small left central L5-S1 disc extrusion, status post suspected ipsilateral discectomy, with enhancing granulation tissue within the lateral recess, and left S1 neuritis.   Electronically Signed   By: Elon Alas   On: 05/31/2013 01:54   Ir Fluoro Guide Ndl Plmt / Bx  06/10/2013   CLINICAL DATA:  Postsurgical fluid collection in the lumbar region. Suspicious of infection.  EXAM: IR FLUORO GUIDE NEEDLE PLACEMENT /BIOPSY:  MEDICATIONS: Versed 1 mg IV. Fentanyl 50 mcg IV. Dilaudid 1 mg IV.  ANESTHESIA/SEDATION: Conscious sedation.  CONTRAST:  None  PROCEDURE: Following full explanation of the procedure along with the potential associated complications, an informed witnessed consent was obtained.  The patient was laid prone on the fluoroscopic table.  The skin overlying the lumbar region was then prepped and draped in the usual sterile fashion.  The skin entry site at the level  of L4-L5 in the paraspinous region was then infiltrated with 0.25% bupivacaine, and carried to the deeper tissues.  Using biplane intermittent fluoroscopy, a 20 gauge, a 22 gauge and an 18 gauge, spine needle were then advanced in different locations projecting superiorly and inferiorly in the midline. Using a 20 mL syringe, approximately 12 cc of thick cloudy blood-stained fluid was obtained and sent for microbiologic pathologic analysis.  Hemostasis was then achieved at the skin entry sites. The patient tolerated the procedure well.  COMPLICATIONS: None immediate.  FINDINGS: As above.  IMPRESSION: Status post fluoroscopic guided needle aspiration as described above for paraspinous fluid collection.   Electronically Signed   By: Luanne Bras M.D.   On: 06/08/2013 15:39   Dg Chest Port 1 View  06/16/2013   CLINICAL DATA Altered mental status  EXAM PORTABLE CHEST - 1 VIEW  COMPARISON 06/01/2013  FINDINGS Mild cardiomegaly which is chronic. Long coronary artery stent noted. There is chronic elevation the right diaphragm. Streaky retrocardiac opacity which is stable from prior and most consistent with scarring. No definite consolidation, edema, effusion, or pneumothorax. Chronic elevation the right diaphragm. Bilateral glenohumeral arthroplasty, total on the right. There is remodeling of the glenoid left.  IMPRESSION 1. No evidence of edema or pneumonia. 2. Stable, mild scarring at the left base.  SIGNATURE  Electronically Signed   By: Jorje Guild M.D.   On: 06/16/2013 18:19   Dg Chest Port 1 View  05/23/2013   CLINICAL  DATA:  Nausea and shortness of breath.  EXAM: PORTABLE CHEST - 1 VIEW  COMPARISON:  PA and lateral chest 05/08/2013 in 07/14/2012.  FINDINGS: Marked elevation of the right hemidiaphragm relative to the left is unchanged. Lungs are clear. Heart size is normal. No pneumothorax or pleural effusion. Bilateral shoulder replacements noted.  IMPRESSION: No acute disease.  Stable compared to  prior exam.   Electronically Signed   By: Inge Rise M.D.   On: 05/23/2013 19:09    My personal review of EKG: Normal sinus rhythm at 85 beats per minute, possible old anterior MI    Assessment & Plan  1. altered mental status with leukocytosis; unknown source with negative chest x-ray and urinalysis    ? Pain medications induced or hyponatremia    Check MRI of the spine in a.m.    check CBC in a.m.    Check ESR     orthopedic surgery following    Continue OxyContin , hold by mouth Dilaudid and change to IVDilaudid when necessary 2. Hyponatremia    Start normal saline 3. History of alcoholism in the past but not lately    Start thiamine   DVT Prophylaxis Lovenox  AM Labs Ordered, also please review Full Orders  Family Communication: Admission, patients condition and plan of care including tests being ordered have been discussed with the patient and son who indicate understanding and agree with the plan and Code Status.  Code Status full  Disposition Plan: Home with home health  Time spent in minutes : 36 minutes  Condition GUARDED   @SIGNATURE @

## 2013-06-16 NOTE — ED Provider Notes (Signed)
Medical screening examination/treatment/procedure(s) were performed by a resident physician or non-physician practitioner and as the supervising physician I was immediately available for consultation/collaboration.  Lynne Leader, MD    Gregor Hams, MD 06/16/13 2121

## 2013-06-16 NOTE — ED Notes (Addendum)
Pt in with son who states patient is more confused since this morning, states he woke up with symptoms, history of confusion since some recent back surgeries but has been improving, this morning symptoms returned, pt alert and oriented at this time, does not remember events from this am, family has also noted a bruise to right flank x3 days with unknown injury- family adds on that they gave patient two benadryl tablets last night before bed and are unsure if that could have caused symptoms.

## 2013-06-17 ENCOUNTER — Ambulatory Visit: Payer: Medicare Other | Admitting: Physical Therapy

## 2013-06-17 DIAGNOSIS — N179 Acute kidney failure, unspecified: Secondary | ICD-10-CM

## 2013-06-17 DIAGNOSIS — R509 Fever, unspecified: Secondary | ICD-10-CM

## 2013-06-17 LAB — CBC
HCT: 31.1 % — ABNORMAL LOW (ref 39.0–52.0)
Hemoglobin: 11 g/dL — ABNORMAL LOW (ref 13.0–17.0)
MCH: 29.8 pg (ref 26.0–34.0)
MCHC: 35.4 g/dL (ref 30.0–36.0)
MCV: 84.3 fL (ref 78.0–100.0)
Platelets: 188 10*3/uL (ref 150–400)
RBC: 3.69 MIL/uL — ABNORMAL LOW (ref 4.22–5.81)
RDW: 16.7 % — ABNORMAL HIGH (ref 11.5–15.5)
WBC: 9.6 10*3/uL (ref 4.0–10.5)

## 2013-06-17 LAB — BASIC METABOLIC PANEL
BUN: 13 mg/dL (ref 6–23)
CO2: 23 mEq/L (ref 19–32)
Calcium: 8.2 mg/dL — ABNORMAL LOW (ref 8.4–10.5)
Chloride: 90 mEq/L — ABNORMAL LOW (ref 96–112)
Creatinine, Ser: 0.44 mg/dL — ABNORMAL LOW (ref 0.50–1.35)
GFR calc Af Amer: >90 mL/min (ref >90)
GFR calc non Af Amer: >90 mL/min (ref >90)
Glucose, Bld: 89 mg/dL (ref 70–99)
Potassium: 3.8 mEq/L (ref 3.7–5.3)
Sodium: 124 mEq/L — ABNORMAL LOW (ref 137–147)

## 2013-06-17 LAB — SEDIMENTATION RATE: Sed Rate: 17 mm/hr — ABNORMAL HIGH (ref 0–16)

## 2013-06-17 MED ORDER — ALBUTEROL SULFATE (2.5 MG/3ML) 0.083% IN NEBU
2.5000 mg | INHALATION_SOLUTION | Freq: Two times a day (BID) | RESPIRATORY_TRACT | Status: DC
  Administered 2013-06-17 – 2013-06-19 (×4): 2.5 mg via RESPIRATORY_TRACT
  Filled 2013-06-17 (×4): qty 3

## 2013-06-17 MED ORDER — FLUOXETINE HCL 20 MG PO CAPS
20.0000 mg | ORAL_CAPSULE | Freq: Every day | ORAL | Status: DC
  Administered 2013-06-18 – 2013-06-19 (×2): 20 mg via ORAL
  Filled 2013-06-17 (×2): qty 1

## 2013-06-17 MED ORDER — TAMSULOSIN HCL 0.4 MG PO CAPS
0.4000 mg | ORAL_CAPSULE | Freq: Every day | ORAL | Status: DC
  Administered 2013-06-17 – 2013-06-19 (×3): 0.4 mg via ORAL
  Filled 2013-06-17 (×3): qty 1

## 2013-06-17 MED ORDER — ALPRAZOLAM 0.5 MG PO TABS: 0.5000 mg | ORAL_TABLET | Freq: Every evening | ORAL | Status: DC | PRN

## 2013-06-17 NOTE — Progress Notes (Signed)
[facility]  Patient Status: Active (receiving services up to time of hospitalization)  [facility] is providing the following services: RN and PT  If patient discharges after hours, please call [phone].   [name] 06/17/2013, 10:34 AM

## 2013-06-17 NOTE — Progress Notes (Signed)
INITIAL NUTRITION ASSESSMENT  DOCUMENTATION CODES Per approved criteria  -Severe malnutrition in the context of chronic illness   INTERVENTION: Recommend the addition of Ensure Complete PO TID, each supplement provides 350 kcal and 13 grams of protein, once diet order permits. Pt only likes chocolate flavor, on ice. Recommend onitor magnesium, potassium, and phosphorus daily for at least 3 days, MD to replete as needed, as pt is at risk for refeeding syndrome given ongoing severe weight loss and inadequate intake. Diet advancement per team's discretion. If oral intake does not improve, recommend initiation of nutrition support. RD to continue to follow nutrition care plan.  NUTRITION DIAGNOSIS: Malnutrition related to inadequate oral intake as evidenced by severe depletion of muscle mass and 28% weight loss in 4 months.   Goal: Diet advancement as tolerated. Intake to meet >90% of estimated nutrition needs.  Monitor:  weight trends, lab trends, I/O's, PO intake, supplement tolerance, further diet advancement  Reason for Assessment: MD Consult for assessment of nutrition requirements/status  68 y.o. male  Admitting Dx: AMS  ASSESSMENT: Patient is a 68 y.o. male with a Past Medical History of , recent back surgery complicated by a wound infection, malnutrition, diarrhea, laryngitis, multiple hospitalizations over the past few months. Pt was followed by RD staff during inpatient rehab admission at end of last month. Admitted now with HA and AMS.  Patient was placed on megace earlier this month while in inpatient rehab. Oral intake had improved and pt was eating 100% and consuming 1-2 Ensure Complete supplements daily. Despite this, pt is now down to 113 lb, this is a 15 lb weight loss since hospital d/c. Pt states that for the past week and a half, he has been eating very little. States that he feels terrible. He notes that he is very concerned about his weight loss, as he is down to his  lowest weight ever of 113 lb.   Nutrition Focused Physical Exam:  Subcutaneous Fat:  Orbital Region: mild depletion Upper Arm Region: severe depletion Thoracic and Lumbar Region: NA  Muscle:  Temple Region: mild depletion Clavicle Bone Region: severe depletion Clavicle and Acromion Bone Region: severe depletion Scapular Bone Region: NA Dorsal Hand: mild depletion Patellar Region: moderate depletion Anterior Thigh Region: severe depletion Posterior Calf Region: severe depletion  Edema: none  Pt meets criteria for severe MALNUTRITION in the context of chronic illness as evidenced by severe depletion of muscle mass and 28% weight loss in 4 months.  Sodium trending down, currently 124 NS at 75 ml/hr, vitamin D, scheduled colace BID, thiamine   Height: Ht Readings from Last 1 Encounters:  06/16/13 5' 4"  (1.626 m)    Weight: Wt Readings from Last 1 Encounters:  06/16/13 113 lb 15.7 oz (51.7 kg)    Ideal Body Weight: 59.1 kg  % Ideal Body Weight: 87%  Wt Readings from Last 10 Encounters:  06/16/13 113 lb 15.7 oz (51.7 kg)  06/10/13 128 lb (58.06 kg)  05/27/13 136 lb 2.1 oz (61.75 kg)  05/18/13 130 lb (58.968 kg)  05/06/13 143 lb 4.8 oz (65 kg)  05/05/13 141 lb (63.957 kg)  05/01/13 144 lb (65.318 kg)  04/12/13 137 lb (62.143 kg)  04/12/13 138 lb 8 oz (62.823 kg)  03/16/13 158 lb 4.6 oz (71.8 kg)    Usual Body Weight: 158 lb  % Usual Body Weight: 72%  BMI:  Body mass index is 19.55 kg/(m^2). Normal weight  Estimated Nutritional Needs: Kcal: 1800-2000 Protein: 100-110 gm Fluid: 1.8-2 L  Skin: back wound from recent back surgery  Diet Order: Clear Liquid   EDUCATION NEEDS: -No education needs addressed    Intake/Output Summary (Last 24 hours) at 06/17/13 0819 Last data filed at 06/16/13 2241  Gross per 24 hour  Intake    120 ml  Output      0 ml  Net    120 ml    Last BM: PTA  Labs:   Recent Labs Lab 06/16/13 1614 06/17/13 0449  NA 126*  124*  K 4.2 3.8  CL 89* 90*  CO2 23 23  BUN 21 13  CREATININE 0.54 0.44*  CALCIUM 9.0 8.2*  GLUCOSE 106* 89    CBG (last 3)  No results found for this basename: GLUCAP,  in the last 72 hours  Scheduled Meds: . albuterol  2.5 mg Nebulization Q6H  . aspirin EC  81 mg Oral Daily  . cholecalciferol  1,000 Units Oral Daily  . docusate sodium  100 mg Oral BID  . enoxaparin (LOVENOX) injection  40 mg Subcutaneous Q24H  . FLUoxetine  40 mg Oral BID  . fluticasone  2 spray Each Nare QHS  . guaiFENesin  1,200 mg Oral BID  . Linaclotide  290 mcg Oral Daily  . losartan  50 mg Oral Daily  . metoprolol tartrate  25 mg Oral BID  . OxyCODONE  10 mg Oral Q12H  . pantoprazole  20 mg Oral Daily  . pneumococcal 23 valent vaccine  0.5 mL Intramuscular Tomorrow-1000  . predniSONE  5 mg Oral QAC breakfast  . senna  1 tablet Oral BID  . simvastatin  40 mg Oral Q lunch  . thiamine  100 mg Oral Daily  . tiotropium  18 mcg Inhalation Daily    Continuous Infusions: . sodium chloride 200 mL/hr at 06/16/13 1821  . sodium chloride 75 mL/hr at 06/17/13 0158    Past Medical History  Diagnosis Date  . Hx of colonic polyps   . Diverticulosis   . Hypertension   . 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 CAD (coronary artery disease)     minimal coronary plaque in the LAD and right coronary system. 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 7-06. Last nuclear stress 11-17-06 fixed anterior/ inferior defect, no inducible ischemia, EF 81%  . Allergic rhinitis   . Hx of colonoscopy   . Dysrhythmia 01-24-12    past hx. A.Fib x1 episode-responded to med.  . Depression   . GERD (gastroesophageal reflux disease)   . COPD (chronic obstructive pulmonary disease)     pt denies this hx on 05/24/2013  . Osteoarthritis   . RA (rheumatoid arthritis)   . Chronic back pain     "all over back" (06/16/2013)   . PONV (postoperative nausea and vomiting)   . High cholesterol   . Sleep apnea     "never dx'd; snores bad" (06/16/2013)  . Daily headache   . Falls frequently     "since 02/2013" (06/16/2013)  . Anxiety     Past Surgical History  Procedure Laterality Date  . Appendectomy    . Total knee arthroplasty Right 2002     partial  . Shoulder hemi-arthroplasty Left 2008    partial  . Foot surgery Right 11-08    for removal of bone spurs-  . Ankle reconstruction Right 6-09    [facility]  .  Posterior lumbar fusion  12-10    L4-5 diskectomy w/ fusion, cage placement and rods; [facility]  . Partial knee arthroplasty Left   . Total shoulder replacement Right   . Joint replacement    . Cataract extraction, bilateral Bilateral 01-24-12  . Orif shoulder fracture  02/06/2012    Procedure: OPEN REDUCTION INTERNAL FIXATION (ORIF) SHOULDER FRACTURE;  Surgeon: Nita Sells, MD;  Location: [facility];  Service: Orthopedics;  Laterality: Left;  ORIF of a Left Shoulder Fracture with  Iliac Crest Bone Graft aspiration   . Harvest bone graft  02/06/2012    Procedure: HARVEST ILIAC BONE GRAFT;  Surgeon: Nita Sells, MD;  Location: [facility];  Service: Orthopedics;;  bone marrow aspirqation   . Hardware removal  03/09/2012    Procedure: HARDWARE REMOVAL;  Surgeon: Nita Sells, MD;  Location: [facility];  Service: Orthopedics;  Laterality: Left;  Hardware Removal from Left Shoulder  . Shoulder arthroscopy Right 2013  . Carpal tunnel release Right 1990's  . Total ankle replacement Right 2008    at [facility]  . Hammer toe surgery Right 07/2012    "broke 4 hammertoes"   . Posterior lumbar fusion 4 level N/A 03/02/2013    Procedure: Lumbar One to Sacral One Posterior lumbar interbody fusion;  Surgeon: Floyce Stakes, MD;  Location: [facility];  Service: Neurosurgery;  Laterality: N/A;  L1 to S1 Posterior lumbar interbody fusion  . Lumbar wound debridement N/A 03/17/2013     Procedure: Incision and drainage of superficial lumbar wound;  Surgeon: Floyce Stakes, MD;  Location: [facility];  Service: Neurosurgery;  Laterality: N/A;  Incision and drainage of superficial lumbar wound  . Tonsillectomy    . Coronary angioplasty with stent placement  2006    "while repairing 1st stent, a second area tore and they had to place 2nd stent " ?LAD & CX  . Inguinal hernia repair Bilateral     Inda Coke MS, RD, LDN Inpatient Registered Dietitian Pager: [phone] After-hours pager: [phone]

## 2013-06-17 NOTE — Plan of Care (Signed)
Problem: Phase I Progression Outcomes Goal: Voiding-avoid urinary catheter unless indicated Outcome: Not Met (add Reason) In and out catheter. Patient unable to void.

## 2013-06-17 NOTE — Progress Notes (Addendum)
[facility] PROGRESS NOTE  [name] LID:[id number] DOB: 21-Nov-1945 DOA: 06/16/2013 PCP: Adella Hare, MD  Assessment/Plan: 1. Altered mental status -metabolic encephalopathy due to hyponatremia, polypharmacy -improving -d/w son, minimize narcotics, xanax, robaxin -no fevers, mild leukocytosis resolved  2. Hyponatremia -has chronic hyponatremia/SIADH -check Urine Na/Osm -stop IVF, repeat Bmet this afternoon and in am -cut down Zoloft dose  3. H/o chronic back pain -s/p laminectomy and fusion -has chronic wound which is healing well  4. CAD -stable  5. COPD -stable  6. H/o RA  7. Urinary retention -add flomax, ambulate  DVT proph: lovenox  Code Status: Full Code Family Communication: called and d/w son and daughter in law Disposition Plan: inpatient     HPI/Subjective: Feels good, unable to void this am  Objective: Filed Vitals:   06/17/13 1047  BP: 171/87  Pulse: 72  Temp:   Resp:     Intake/Output Summary (Last 24 hours) at 06/17/13 1130 Last data filed at 06/17/13 1113  Gross per 24 hour  Intake    240 ml  Output   1150 ml  Net   -910 ml   Filed Weights   06/16/13 2148  Weight: 51.7 kg (113 lb 15.7 oz)    Exam:   General: Alert, awake, oriented to self, partly to place  Cardiovascular: S1S2/RRR  Respiratory: CTAB  Abdomen: soft, NT, BS present  Musculoskeletal: no edema c/c  Back: small linear surgical wound, clean red wound base  Data Reviewed: Basic Metabolic Panel:  Recent Labs Lab 06/16/13 1614 06/17/13 0449  NA 126* 124*  K 4.2 3.8  CL 89* 90*  CO2 23 23  GLUCOSE 106* 89  BUN 21 13  CREATININE 0.54 0.44*  CALCIUM 9.0 8.2*   Liver Function Tests:  Recent Labs Lab 06/16/13 1614  AST 16  ALT 9  ALKPHOS 53  BILITOT 0.9  PROT 7.0  ALBUMIN 3.3*   No results found for this basename: LIPASE, AMYLASE,  in the last 168 hours No results found for this basename: AMMONIA,  in the last 168  hours CBC:  Recent Labs Lab 06/16/13 1614 06/17/13 0449  WBC 14.7* 9.6  HGB 13.0 11.0*  HCT 37.3* 31.1*  MCV 85.7 84.3  PLT 212 188   Cardiac Enzymes: No results found for this basename: CKTOTAL, CKMB, CKMBINDEX, TROPONINI,  in the last 168 hours BNP (last 3 results)  Recent Labs  05/06/13 1321  PROBNP 306.1*   CBG: No results found for this basename: GLUCAP,  in the last 168 hours  Recent Results (from the past 240 hour(s))  WOUND CULTURE     Status: None   Collection Time    06/08/13  3:07 PM      Result Value Ref Range Status   Specimen Description WOUND   Final   Special Requests LUMBAR SEROMA WOUND   Final   Gram Stain     Final   Value: MODERATE WBC PRESENT, PREDOMINANTLY PMN     NO SQUAMOUS EPITHELIAL CELLS SEEN     NO ORGANISMS SEEN     Performed at [facility]   Culture     Final   Value: NO GROWTH 2 DAYS     Performed at [facility]   Report Status 06/10/2013 FINAL   Final     Studies: Ct Head Wo Contrast  06/16/2013   CLINICAL DATA Confusion with headache.  EXAM CT HEAD WITHOUT CONTRAST  TECHNIQUE Contiguous axial images were obtained from the base  of the skull through the vertex without contrast.  COMPARISON 05/06/2013.  FINDINGS Motion degraded exam.  Study was repeated.  Overall diagnostic.  Moderate atrophy and chronic microvascular ischemic change. No acute stroke, hemorrhage, mass lesion, hydrocephalus, or extraction fluid. Calvarium intact. No sinus or mastoid disease. Negative orbits. Stable appearance from priors.  IMPRESSION Chronic changes as described.  No acute intracranial abnormality.  SIGNATURE  Electronically Signed   By: Rolla Flatten M.D.   On: 06/16/2013 17:20   Dg Chest Port 1 View  06/16/2013   CLINICAL DATA Altered mental status  EXAM PORTABLE CHEST - 1 VIEW  COMPARISON 06/01/2013  FINDINGS Mild cardiomegaly which is chronic. Long coronary artery stent noted. There is chronic elevation the right diaphragm. Streaky  retrocardiac opacity which is stable from prior and most consistent with scarring. No definite consolidation, edema, effusion, or pneumothorax. Chronic elevation the right diaphragm. Bilateral glenohumeral arthroplasty, total on the right. There is remodeling of the glenoid left.  IMPRESSION 1. No evidence of edema or pneumonia. 2. Stable, mild scarring at the left base.  SIGNATURE  Electronically Signed   By: Jorje Guild M.D.   On: 06/16/2013 18:19    Scheduled Meds: . albuterol  2.5 mg Nebulization Q6H  . aspirin EC  81 mg Oral Daily  . cholecalciferol  1,000 Units Oral Daily  . docusate sodium  100 mg Oral BID  . enoxaparin (LOVENOX) injection  40 mg Subcutaneous Q24H  . FLUoxetine  40 mg Oral BID  . fluticasone  2 spray Each Nare QHS  . guaiFENesin  1,200 mg Oral BID  . Linaclotide  290 mcg Oral Daily  . losartan  50 mg Oral Daily  . metoprolol tartrate  25 mg Oral BID  . OxyCODONE  10 mg Oral Q12H  . pantoprazole  20 mg Oral Daily  . pneumococcal 23 valent vaccine  0.5 mL Intramuscular Tomorrow-1000  . predniSONE  5 mg Oral QAC breakfast  . senna  1 tablet Oral BID  . simvastatin  40 mg Oral Q lunch  . tamsulosin  0.4 mg Oral Daily  . thiamine  100 mg Oral Daily  . tiotropium  18 mcg Inhalation Daily   Continuous Infusions:  Antibiotics Given (last 72 hours)   None      Principal Problem:   Altered mental status Active Problems:   Hyponatremia    Time spent: 78mn    [facility] Pager [phone] If 7PM-7AM, please contact night-coverage at [email or web address], password [email or web address]3/03/2014, 11:30 AM  LOS: 1 day

## 2013-06-18 LAB — CBC
HCT: 32.2 % — ABNORMAL LOW (ref 39.0–52.0)
Hemoglobin: 11.3 g/dL — ABNORMAL LOW (ref 13.0–17.0)
MCH: 29.3 pg (ref 26.0–34.0)
MCHC: 35.1 g/dL (ref 30.0–36.0)
MCV: 83.4 fL (ref 78.0–100.0)
Platelets: 198 10*3/uL (ref 150–400)
RBC: 3.86 MIL/uL — ABNORMAL LOW (ref 4.22–5.81)
RDW: 16.6 % — ABNORMAL HIGH (ref 11.5–15.5)
WBC: 6 10*3/uL (ref 4.0–10.5)

## 2013-06-18 LAB — BASIC METABOLIC PANEL
BUN: 15 mg/dL (ref 6–23)
CO2: 22 mEq/L (ref 19–32)
Calcium: 8.5 mg/dL (ref 8.4–10.5)
Chloride: 93 mEq/L — ABNORMAL LOW (ref 96–112)
Creatinine, Ser: 0.48 mg/dL — ABNORMAL LOW (ref 0.50–1.35)
GFR calc Af Amer: >90 mL/min (ref >90)
GFR calc non Af Amer: >90 mL/min (ref >90)
Glucose, Bld: 98 mg/dL (ref 70–99)
Potassium: 3.9 mEq/L (ref 3.7–5.3)
Sodium: 129 mEq/L — ABNORMAL LOW (ref 137–147)

## 2013-06-18 MED ORDER — ENSURE COMPLETE PO LIQD
237.0000 mL | Freq: Three times a day (TID) | ORAL | Status: DC
  Administered 2013-06-18 – 2013-06-19 (×3): 237 mL via ORAL

## 2013-06-18 NOTE — Evaluation (Signed)
Occupational Therapy Evaluation Patient Details Name: [name] MRN: [id number] DOB: 03-01-1946 Today's Date: 06/18/2013 Time: 4098-1191 OT Time Calculation (min): 21 min  OT Assessment / Plan / Recommendation History of present illness   68 yo adm with altered mental status with leukocytosis; unknown source with negative chest x-ray and urinalysis; recent back surgery complicated by post op infection, laryngitis   Clinical Impression   Pt demos decline in function with ADLs and ADL mobility safety and would benefit from acute OT services to address impairments to increase level of function and safety    OT Assessment  Patient needs continued OT Services    Follow Up Recommendations  No OT follow up;Supervision/Assistance - 24 hour    Barriers to Discharge   none  Equipment Recommendations  None recommended by OT    Recommendations for Other Services    Frequency  Min 2X/week    Precautions / Restrictions Precautions Precautions: Back;Fall Type of Shoulder Precautions: pt able to recall 3/3 back precautions Restrictions Weight Bearing Restrictions: No   Pertinent Vitals/Pain No c/o pain    ADL  Grooming: Performed;Min guard Where Assessed - Grooming: Supported standing Upper Body Bathing: Set up;Simulated Where Assessed - Upper Body Bathing: Unsupported sitting Lower Body Bathing: Moderate assistance;Simulated Upper Body Dressing: Performed;Set up Where Assessed - Upper Body Dressing: Unsupported sitting Lower Body Dressing: Moderate assistance;Performed Toilet Transfer: Minimal assistance;Performed;Min guard Toilet Transfer Method: Sit to Loss adjuster, chartered: Raised toilet seat with arms (or 3-in-1 over toilet) Toileting - Clothing Manipulation and Hygiene: Performed;Minimal assistance;Min guard Where Assessed - Camera operator Manipulation and Hygiene: Standing Tub/Shower Transfer Method: Not assessed Equipment Used: Gait belt;Rolling  walker;Other (comment) (3 in 1) Transfers/Ambulation Related to ADLs: cues for hand placement    OT Diagnosis: Generalized weakness  OT Problem List: Decreased strength;Decreased activity tolerance;Impaired balance (sitting and/or standing) OT Treatment Interventions: Self-care/ADL training;Therapeutic exercise;DME and/or AE instruction;Therapeutic activities;Patient/family education;Balance training   OT Goals(Current goals can be found in the care plan section) Acute Rehab OT Goals Patient Stated Goal: to just get better/stronger and get back to his home ADL Goals Pt Will Perform Grooming: with supervision;with set-up;standing Pt Will Perform Lower Body Bathing: with min assist;with adaptive equipment;with caregiver independent in assisting Pt Will Perform Lower Body Dressing: with min assist;with adaptive equipment;with caregiver independent in assisting Pt Will Transfer to Toilet: with min guard assist;with supervision;regular height toilet;ambulating (3 in 1 over toilet) Pt Will Perform Toileting - Clothing Manipulation and hygiene: with min guard assist;with supervision;sit to/from stand Pt Will Perform Tub/Shower Transfer: with min guard assist;with supervision;shower seat  Visit Information  Last OT Received On: 06/18/13 Assistance Needed: +1 History of Present Illness:   68 yo adm with altered mental status with leukocytosis; unknown source with negative chest x-ray and urinalysis; recent back surgery complicated by post op infection, laryngitis       Prior Functioning     Home Living Family/patient expects to be discharged to:: Private residence Living Arrangements: Children;Other relatives Available Help at Discharge: Family Type of Home: House Home Access: Level entry Entrance Stairs-Number of Steps: 5 -pt's house Entrance Stairs-Rails: Right;Left Home Layout: One level Home Equipment: [place] - 2 wheels;Cane - single point;Shower seat;Hand held shower head;Wheelchair  - manual;Hospital bed;Adaptive equipment Adaptive Equipment: Reacher;Sock aid;Long-handled shoe horn Additional Comments: Pt states he will be at his son's house for a few weeks to recuperate. All info above is based on son's home  Lives With: Son;Daughter Prior Function Level of Independence: Independent with  assistive device(s) Comments: Pt has been walking short distances with a RW, but reports daily falls due to leg weakness. Wife assisted pt with LBADLs Communication Communication: No difficulties Dominant Hand: Right         Vision/Perception Vision - History Baseline Vision: No visual deficits Patient Visual Report: No change from baseline Perception Perception: Within Functional Limits   Cognition  Cognition Arousal/Alertness: Awake/alert Behavior During Therapy: WFL for tasks assessed/performed Overall Cognitive Status: Within Functional Limits for tasks assessed    Extremity/Trunk Assessment Upper Extremity Assessment Upper Extremity Assessment: Overall WFL for tasks assessed;Generalized weakness RUE Deficits / Details: RA in B hands LUE Deficits / Details: RA in B hands Lower Extremity Assessment Lower Extremity Assessment: Defer to PT evaluation     Mobility Bed Mobility General bed mobility comments: NT, pt seated in chair with with nurse tech assisting with ADLs Transfers Overall transfer level: Needs assistance Equipment used: Rolling walker (2 wheeled) Transfers: Sit to/from Stand Sit to Stand: Min assist;Min guard Stand pivot transfers: Supervision General transfer comment: cues for hand placement          Balance Balance Overall balance assessment: History of Falls Sitting-balance support: No upper extremity supported;Feet supported Sitting balance-Leahy Scale: Good Standing balance support: Single extremity supported;Bilateral upper extremity supported;During functional activity Standing balance-Leahy Scale: Fair Standing balance comment: pt  able to maintain standing without UE support but with intermittent min assist due to dizziness/shakiness   End of Session OT - End of Session Equipment Utilized During Treatment: Rolling walker;Other (comment) (3 in 1) Activity Tolerance: Patient tolerated treatment well Patient left: in chair;with call bell/phone within reach  GO     Britt Bottom 06/18/2013, 1:11 PM

## 2013-06-18 NOTE — Progress Notes (Signed)
[facility] PROGRESS NOTE  [name] UXN:[id number] DOB: [birth year] DOA: 06/16/2013 PCP: Adella Hare, MD  Assessment/Plan: 1. Altered mental status/metabolic encephalopathy  -due to hyponatremia, polypharmacy -improving -d/w son, minimize narcotics, xanax, robaxin -no fevers, mild leukocytosis resolved  2. Hyponatremia -has chronic hyponatremia/SIADH -check Urine Na/Osm -stopped IVF, repeat Bmet in am, Fluid restriction -cut down Zoloft dose -improving  3. H/o chronic back pain -s/p laminectomy and fusion -has chronic wound which is healing well -continue wound care  4. CAD -stable  5. COPD -stable  6. H/o RA  7. Urinary retention -able to urinate now -continue flomax, ambulate  DVT proph: lovenox  Code Status: Full Code Family Communication: called and d/w son and daughter in law Disposition Plan: inpatient     HPI/Subjective: Feels ok, some nausea this am  Objective: Filed Vitals:   06/18/13 1322  BP: 129/84  Pulse: 75  Temp: 98.8 F (37.1 C)  Resp: 18    Intake/Output Summary (Last 24 hours) at 06/18/13 1605 Last data filed at 06/18/13 0356  Gross per 24 hour  Intake      0 ml  Output    300 ml  Net   -300 ml   Filed Weights   06/16/13 2148 06/18/13 0553  Weight: 51.7 kg (113 lb 15.7 oz) 49.9 kg (110 lb 0.2 oz)    Exam:   General: Alert, awake, oriented to self, partly to place  Cardiovascular: S1S2/RRR  Respiratory: CTAB  Abdomen: soft, NT, BS present  Musculoskeletal: no edema c/c  Back: small linear surgical wound, clean red wound base  Data Reviewed: Basic Metabolic Panel:  Recent Labs Lab 06/16/13 1614 06/17/13 0449 06/18/13 0643  NA 126* 124* 129*  K 4.2 3.8 3.9  CL 89* 90* 93*  CO2 23 23 22   GLUCOSE 106* 89 98  BUN 21 13 15   CREATININE 0.54 0.44* 0.48*  CALCIUM 9.0 8.2* 8.5   Liver Function Tests:  Recent Labs Lab 06/16/13 1614  AST 16  ALT 9  ALKPHOS 53  BILITOT 0.9  PROT 7.0   ALBUMIN 3.3*   No results found for this basename: LIPASE, AMYLASE,  in the last 168 hours No results found for this basename: AMMONIA,  in the last 168 hours CBC:  Recent Labs Lab 06/16/13 1614 06/17/13 0449 06/18/13 0643  WBC 14.7* 9.6 6.0  HGB 13.0 11.0* 11.3*  HCT 37.3* 31.1* 32.2*  MCV 85.7 84.3 83.4  PLT 212 188 198   Cardiac Enzymes: No results found for this basename: CKTOTAL, CKMB, CKMBINDEX, TROPONINI,  in the last 168 hours BNP (last 3 results)  Recent Labs  05/06/13 1321  PROBNP 306.1*   CBG: No results found for this basename: GLUCAP,  in the last 168 hours  No results found for this or any previous visit (from the past 240 hour(s)).   Studies: Ct Head Wo Contrast  06/16/2013   CLINICAL DATA Confusion with headache.  EXAM CT HEAD WITHOUT CONTRAST  TECHNIQUE Contiguous axial images were obtained from the base of the skull through the vertex without contrast.  COMPARISON 05/06/2013.  FINDINGS Motion degraded exam.  Study was repeated.  Overall diagnostic.  Moderate atrophy and chronic microvascular ischemic change. No acute stroke, hemorrhage, mass lesion, hydrocephalus, or extraction fluid. Calvarium intact. No sinus or mastoid disease. Negative orbits. Stable appearance from priors.  IMPRESSION Chronic changes as described.  No acute intracranial abnormality.  SIGNATURE  Electronically Signed   By: Rolla Flatten M.D.   On: 06/16/2013  17:20   Dg Chest Port 1 View  06/16/2013   CLINICAL DATA Altered mental status  EXAM PORTABLE CHEST - 1 VIEW  COMPARISON 06/01/2013  FINDINGS Mild cardiomegaly which is chronic. Long coronary artery stent noted. There is chronic elevation the right diaphragm. Streaky retrocardiac opacity which is stable from prior and most consistent with scarring. No definite consolidation, edema, effusion, or pneumothorax. Chronic elevation the right diaphragm. Bilateral glenohumeral arthroplasty, total on the right. There is remodeling of the glenoid  left.  IMPRESSION 1. No evidence of edema or pneumonia. 2. Stable, mild scarring at the left base.  SIGNATURE  Electronically Signed   By: Jorje Guild M.D.   On: 06/16/2013 18:19    Scheduled Meds: . albuterol  2.5 mg Nebulization BID  . aspirin EC  81 mg Oral Daily  . cholecalciferol  1,000 Units Oral Daily  . docusate sodium  100 mg Oral BID  . enoxaparin (LOVENOX) injection  40 mg Subcutaneous Q24H  . feeding supplement (ENSURE COMPLETE)  237 mL Oral TID BM  . FLUoxetine  20 mg Oral Daily  . fluticasone  2 spray Each Nare QHS  . guaiFENesin  1,200 mg Oral BID  . Linaclotide  290 mcg Oral Daily  . losartan  50 mg Oral Daily  . metoprolol tartrate  25 mg Oral BID  . OxyCODONE  10 mg Oral Q12H  . pantoprazole  20 mg Oral Daily  . pneumococcal 23 valent vaccine  0.5 mL Intramuscular Tomorrow-1000  . predniSONE  5 mg Oral QAC breakfast  . senna  1 tablet Oral BID  . simvastatin  40 mg Oral Q lunch  . tamsulosin  0.4 mg Oral Daily  . thiamine  100 mg Oral Daily  . tiotropium  18 mcg Inhalation Daily   Continuous Infusions:  Antibiotics Given (last 72 hours)   None      Principal Problem:   Altered mental status Active Problems:   Hyponatremia    Time spent: 66mn    [facility] Pager [phone] If 7PM-7AM, please contact night-coverage at [email or web address], password [email or web address]/13/2015, 4:05 PM  LOS: 2 days

## 2013-06-18 NOTE — Care Management Note (Signed)
    Page 1 of 1   06/18/2013     4:27:50 PM   CARE MANAGEMENT NOTE 06/18/2013  Patient:  [name], [name]   Account Number:  [id number]  Date Initiated:  06/18/2013  Documentation initiated by:  Tomi Bamberger  Subjective/Objective Assessment:   dx AMS  admit- lives with girlfriend.     Action/Plan:   Anticipated DC Date:  06/19/2013   Anticipated DC Plan:  [place]  CM consult      Choice offered to / List presented to:             Status of service:  In process, will continue to follow Medicare Important Message given?   (If response is "NO", the following Medicare IM given date fields will be blank) Date Medicare IM given:   Date Additional Medicare IM given:    Discharge Disposition:    Per UR Regulation:    If discussed at Long Length of Stay Meetings, dates discussed:    Comments:

## 2013-06-18 NOTE — Evaluation (Addendum)
Physical Therapy Evaluation Patient Details Name: [name] MRN: [id number] DOB: 19-Feb-1946 Today's Date: 06/18/2013 Time: 4742-5956 PT Time Calculation (min): 23 min  PT Assessment / Plan / Recommendation History of Present Illness    68 yo adm with altered mental status with leukocytosis; unknown source with negative chest x-ray and urinalysis; recent back surgery complicated by post op infection, laryngitis  Clinical Impression  Pt will benefit from PT to address deficits below; He does not feel he is able to go home today, he has increasing/progressive dizziness with standing; BPs checked to r/o any orthostasis (sitting BP 138/79, standing 132/89); RN aware and feels it may be due to meds; Pt unable to amb due to nausea, RN giving meds upon my departure; If not for these medical issues pt would likely be able to move about quite well; Recommending f/u HHPT at this time    PT Assessment  Patient needs continued PT services    Follow Up Recommendations  Home health PT    Does the patient have the potential to tolerate intense rehabilitation      Barriers to Discharge Decreased caregiver support      Equipment Recommendations  None recommended by PT    Recommendations for Other Services     Frequency Min 3X/week    Precautions / Restrictions Precautions Precautions: Back;Fall   Pertinent Vitals/Pain C/o of stomach pain related to nausea      Mobility  Bed Mobility General bed mobility comments: NT, pt seated in chair with with nurse tech assisting with ADLs Transfers Overall transfer level: Needs assistance Equipment used: Rolling walker (2 wheeled) Transfers: Sit to/from Stand Sit to Stand: Min assist;Min guard General transfer comment: cues for hand placement initially; repeated transfer for activity tolerance and to check pt BP as pt with increasing dizziness with standing;  Ambulation/Gait General Gait Details: unable to amb due to dizziness and nausea;   Pt completed ankle pumps and LAQs x 10 reps bil LEs seated in chair    Exercises     PT Diagnosis: Difficulty walking  PT Problem List: Decreased strength;Decreased activity tolerance;Decreased balance;Decreased mobility;Decreased knowledge of use of DME PT Treatment Interventions: DME instruction;Gait training;Stair training;Therapeutic activities;Functional mobility training;Therapeutic exercise;Balance training;Neuromuscular re-education;Patient/family education     PT Goals(Current goals can be found in the care plan section) Acute Rehab PT Goals Patient Stated Goal: to just get better/stronger and get back to his home PT Goal Formulation: With patient Time For Goal Achievement: 06/25/13 Potential to Achieve Goals: Good  Visit Information  Last PT Received On: 06/18/13 Assistance Needed: +1 History of Present Illness:   68 yo adm with altered mental status with leukocytosis; unknown source with negative chest x-ray and urinalysis; recent back surgery complicated by post op infection, laryngitis       Prior Functioning  Home Living Family/patient expects to be discharged to:: Private residence Living Arrangements: Children;Other relatives Available Help at Discharge: Family Type of Home: House Home Access: Level entry (son's house has 0 steps) Entrance Stairs-Number of Steps: 5 -pt's house Entrance Stairs-Rails: Right;Left Home Layout: One level Home Equipment: Walker - 2 wheels;Cane - single point;Shower seat;Hand held shower head;Wheelchair - manual;Hospital bed;Adaptive equipment Adaptive Equipment: Reacher;Sock aid;Long-handled shoe horn Additional Comments: Pt states he will be at his son's house for a few weeks to recuperate. All info above is based on son's home  Lives With: Son;Daughter Prior Function Level of Independence: Independent with assistive device(s) Comments: Pt has been walking short distances with a RW,  but reports daily falls due to leg weakness. Wife  assisted pt with LBADLs Communication Communication: No difficulties Dominant Hand: Right    Cognition  Cognition Arousal/Alertness: Awake/alert Behavior During Therapy: WFL for tasks assessed/performed Overall Cognitive Status: Within Functional Limits for tasks assessed    Extremity/Trunk Assessment Upper Extremity Assessment Upper Extremity Assessment: Overall WFL for tasks assessed;Generalized weakness RUE Deficits / Details: RA in B hands LUE Deficits / Details: RA in B hands Lower Extremity Assessment Lower Extremity Assessment: Generalized weakness   Balance Balance Overall balance assessment: History of Falls Sitting-balance support: Bilateral upper extremity supported;Feet supported;No upper extremity supported Sitting balance-Leahy Scale: Good Standing balance support: Bilateral upper extremity supported;No upper extremity supported Standing balance-Leahy Scale: Fair Standing balance comment: pt able to maintain standing without UE support but with intermittent min assist due to dizziness/shakiness  End of Session PT - End of Session Activity Tolerance: Patient limited by fatigue;Treatment limited secondary to medical complications (Comment) (dizzy, nauseated) Patient left: in chair;with call bell/phone within reach (RN present, giving nausea meds) Nurse Communication: Mobility status  GP     [facility] 06/18/2013, 12:05 PM

## 2013-06-18 NOTE — Progress Notes (Signed)
NUTRITION FOLLOW-UP  DOCUMENTATION CODES Per approved criteria  -Severe malnutrition in the context of chronic illness   Pt meets criteria for severe MALNUTRITION in the context of chronic illness as evidenced by severe depletion of muscle mass and 28% weight loss in 4 months.  INTERVENTION: Add Ensure Complete PO TID, each supplement provides 350 kcal and 13 grams of protein. Pt only likes chocolate flavor, on ice. Recommend onitor magnesium, potassium, and phosphorus daily for at least 3 days, MD to replete as needed, as pt is at risk for refeeding syndrome given ongoing severe weight loss and inadequate intake. If oral intake does not improve, recommend initiation of nutrition support. RD to continue to follow nutrition care plan.  NUTRITION DIAGNOSIS: Malnutrition related to inadequate oral intake as evidenced by severe depletion of muscle mass and 28% weight loss in 4 months.   Goal: Diet advancement as tolerated. Met. Intake to meet >90% of estimated nutrition needs.  Monitor:  weight trends, lab trends, I/O's, PO intake, supplement tolerance  ASSESSMENT: Patient is a 68 y.o. male with a Past Medical History of , recent back surgery complicated by a wound infection, malnutrition, diarrhea, laryngitis, multiple hospitalizations over the past few months. Pt was followed by RD staff during inpatient rehab admission at end of last month. Admitted now with HA and AMS.  AMS improving.  Sodium trending up, currently 129 Vitamin D, protonix, thiamine scheduled colace BID, thiamine   Height: Ht Readings from Last 1 Encounters:  06/16/13 5' 4"  (1.626 m)    Weight: Wt Readings from Last 1 Encounters:  06/18/13 110 lb 0.2 oz (49.9 kg)    BMI:  Body mass index is 18.87 kg/(m^2). Normal weight  Estimated Nutritional Needs: Kcal: 1800-2000 Protein: 100-110 gm Fluid: 1.8-2 L  Skin: back wound from recent back surgery  Diet Order: General   EDUCATION NEEDS: -No education  needs addressed    Intake/Output Summary (Last 24 hours) at 06/18/13 1123 Last data filed at 06/18/13 0356  Gross per 24 hour  Intake      0 ml  Output    700 ml  Net   -700 ml    Last BM: 3/11  Labs:   Recent Labs Lab 06/16/13 1614 06/17/13 0449 06/18/13 0643  NA 126* 124* 129*  K 4.2 3.8 3.9  CL 89* 90* 93*  CO2 23 23 22   BUN 21 13 15   CREATININE 0.54 0.44* 0.48*  CALCIUM 9.0 8.2* 8.5  GLUCOSE 106* 89 98    CBG (last 3)  No results found for this basename: GLUCAP,  in the last 72 hours  Scheduled Meds: . albuterol  2.5 mg Nebulization BID  . aspirin EC  81 mg Oral Daily  . cholecalciferol  1,000 Units Oral Daily  . docusate sodium  100 mg Oral BID  . enoxaparin (LOVENOX) injection  40 mg Subcutaneous Q24H  . FLUoxetine  20 mg Oral Daily  . fluticasone  2 spray Each Nare QHS  . guaiFENesin  1,200 mg Oral BID  . Linaclotide  290 mcg Oral Daily  . losartan  50 mg Oral Daily  . metoprolol tartrate  25 mg Oral BID  . OxyCODONE  10 mg Oral Q12H  . pantoprazole  20 mg Oral Daily  . pneumococcal 23 valent vaccine  0.5 mL Intramuscular Tomorrow-1000  . predniSONE  5 mg Oral QAC breakfast  . senna  1 tablet Oral BID  . simvastatin  40 mg Oral Q lunch  . tamsulosin  0.4 mg Oral Daily  . thiamine  100 mg Oral Daily  . tiotropium  18 mcg Inhalation Daily    Continuous Infusions:    Inda Coke MS, RD, LDN Inpatient Registered Dietitian Pager: [phone] After-hours pager: [phone]

## 2013-06-19 DIAGNOSIS — Z79899 Other long term (current) drug therapy: Secondary | ICD-10-CM

## 2013-06-19 DIAGNOSIS — M479 Spondylosis, unspecified: Secondary | ICD-10-CM

## 2013-06-19 DIAGNOSIS — E43 Unspecified severe protein-calorie malnutrition: Secondary | ICD-10-CM

## 2013-06-19 LAB — BASIC METABOLIC PANEL
BUN: 14 mg/dL (ref 6–23)
CO2: 24 mEq/L (ref 19–32)
Calcium: 8.4 mg/dL (ref 8.4–10.5)
Chloride: 94 mEq/L — ABNORMAL LOW (ref 96–112)
Creatinine, Ser: 0.5 mg/dL (ref 0.50–1.35)
GFR calc Af Amer: >90 mL/min (ref >90)
GFR calc non Af Amer: >90 mL/min (ref >90)
Glucose, Bld: 91 mg/dL (ref 70–99)
Potassium: 4 mEq/L (ref 3.7–5.3)
Sodium: 129 mEq/L — ABNORMAL LOW (ref 137–147)

## 2013-06-19 LAB — CBC
HCT: 31.6 % — ABNORMAL LOW (ref 39.0–52.0)
Hemoglobin: 11.2 g/dL — ABNORMAL LOW (ref 13.0–17.0)
MCH: 29.7 pg (ref 26.0–34.0)
MCHC: 35.4 g/dL (ref 30.0–36.0)
MCV: 83.8 fL (ref 78.0–100.0)
Platelets: 200 10*3/uL (ref 150–400)
RBC: 3.77 MIL/uL — ABNORMAL LOW (ref 4.22–5.81)
RDW: 16.6 % — ABNORMAL HIGH (ref 11.5–15.5)
WBC: 4.6 10*3/uL (ref 4.0–10.5)

## 2013-06-19 MED ORDER — METHOCARBAMOL 500 MG PO TABS: 500.0000 mg | ORAL_TABLET | Freq: Two times a day (BID) | ORAL | Status: DC | PRN

## 2013-06-19 MED ORDER — HYDROMORPHONE HCL 2 MG PO TABS: 2.0000 mg | ORAL_TABLET | Freq: Four times a day (QID) | ORAL | Status: DC | PRN

## 2013-06-19 MED ORDER — FLUOXETINE HCL 20 MG PO CAPS: 20.0000 mg | ORAL_CAPSULE | Freq: Every day | ORAL | Status: DC

## 2013-06-20 NOTE — Progress Notes (Signed)
   CARE MANAGEMENT NOTE 06/20/2013  Patient:  [name], [name]   Account Number:  [id number]  Date Initiated:  06/18/2013  Documentation initiated by:  Tomi Bamberger  Subjective/Objective Assessment:   dx AMS  admit- lives with girlfriend.     Action/Plan:   [facility] met with pt for [place] discahrge needs   Anticipated DC Date:  06/19/2013   Anticipated DC Plan:  [place]  CM consult      Choice offered to / List presented to:          [facility] arranged  HH-1 RN  [place].   Status of service:  Completed, signed off Medicare Important Message given?   (If response is "NO", the following Medicare IM given date fields will be blank) Date Medicare IM given:   Date Additional Medicare IM given:    Discharge Disposition:  [place]  Per UR Regulation:    If discussed at Long Length of Stay Meetings, dates discussed:    Comments:  06/19/13 LATE entry: [name] has set pt up for HHPT/RN.  [facility] spoke with pt in room.  No other CM needs were comunicated. Mariane Masters, BSN, CM [phone].

## 2013-06-23 ENCOUNTER — Telehealth: Payer: Self-pay

## 2013-06-23 NOTE — Telephone Encounter (Signed)
Merrilee Seashore ( PT @ [facility]) is requesting a verbal order to extend patient's PT for 2x a week for 5 weeks following a recent hospitalization. Is this okay?

## 2013-06-23 NOTE — Telephone Encounter (Signed)
Left a message on a verified voicemail giving Nick(PT @ [facility]) the verbal okay to extend patient's PT per Dr. Naaman Plummer.

## 2013-06-23 NOTE — Telephone Encounter (Signed)
Yes, Verbal order given

## 2013-06-28 NOTE — ED Provider Notes (Signed)
Medical screening examination/treatment/procedure(s) were performed by non-physician practitioner and as supervising physician I was immediately available for consultation/collaboration.   Dot Lanes, MD 06/28/13 [phone]

## 2013-06-30 ENCOUNTER — Emergency Department (HOSPITAL_COMMUNITY)
Admission: EM | Admit: 2013-06-30 | Discharge: 2013-06-30 | Disposition: A | Discharge status: Home / Self Care | Payer: Medicare Other | Attending: Emergency Medicine | Admitting: Emergency Medicine

## 2013-06-30 ENCOUNTER — Emergency Department (HOSPITAL_COMMUNITY): Payer: Medicare Other

## 2013-06-30 ENCOUNTER — Encounter (HOSPITAL_COMMUNITY): Payer: Self-pay | Admitting: Emergency Medicine

## 2013-06-30 ENCOUNTER — Telehealth: Payer: Self-pay

## 2013-06-30 DIAGNOSIS — Z96619 Presence of unspecified artificial shoulder joint: Secondary | ICD-10-CM | POA: Insufficient documentation

## 2013-06-30 DIAGNOSIS — M1990 Unspecified osteoarthritis, unspecified site: Secondary | ICD-10-CM | POA: Insufficient documentation

## 2013-06-30 DIAGNOSIS — M069 Rheumatoid arthritis, unspecified: Secondary | ICD-10-CM | POA: Insufficient documentation

## 2013-06-30 DIAGNOSIS — R509 Fever, unspecified: Secondary | ICD-10-CM

## 2013-06-30 DIAGNOSIS — Z96659 Presence of unspecified artificial knee joint: Secondary | ICD-10-CM | POA: Insufficient documentation

## 2013-06-30 DIAGNOSIS — G8929 Other chronic pain: Secondary | ICD-10-CM | POA: Insufficient documentation

## 2013-06-30 DIAGNOSIS — I10 Essential (primary) hypertension: Secondary | ICD-10-CM | POA: Insufficient documentation

## 2013-06-30 DIAGNOSIS — F0390 Unspecified dementia without behavioral disturbance: Secondary | ICD-10-CM | POA: Insufficient documentation

## 2013-06-30 DIAGNOSIS — R51 Headache: Secondary | ICD-10-CM | POA: Insufficient documentation

## 2013-06-30 DIAGNOSIS — Z79899 Other long term (current) drug therapy: Secondary | ICD-10-CM | POA: Insufficient documentation

## 2013-06-30 DIAGNOSIS — F329 Major depressive disorder, single episode, unspecified: Secondary | ICD-10-CM | POA: Insufficient documentation

## 2013-06-30 DIAGNOSIS — M549 Dorsalgia, unspecified: Secondary | ICD-10-CM | POA: Insufficient documentation

## 2013-06-30 DIAGNOSIS — F411 Generalized anxiety disorder: Secondary | ICD-10-CM | POA: Insufficient documentation

## 2013-06-30 DIAGNOSIS — Z880 Allergy status to penicillin: Secondary | ICD-10-CM | POA: Insufficient documentation

## 2013-06-30 DIAGNOSIS — Z7982 Long term (current) use of aspirin: Secondary | ICD-10-CM | POA: Insufficient documentation

## 2013-06-30 DIAGNOSIS — Z9861 Coronary angioplasty status: Secondary | ICD-10-CM | POA: Insufficient documentation

## 2013-06-30 DIAGNOSIS — I2510 Atherosclerotic heart disease of native coronary artery without angina pectoris: Secondary | ICD-10-CM | POA: Insufficient documentation

## 2013-06-30 DIAGNOSIS — F3289 Other specified depressive episodes: Secondary | ICD-10-CM | POA: Insufficient documentation

## 2013-06-30 DIAGNOSIS — IMO0001 Reserved for inherently not codable concepts without codable children: Secondary | ICD-10-CM | POA: Insufficient documentation

## 2013-06-30 DIAGNOSIS — J4489 Other specified chronic obstructive pulmonary disease: Secondary | ICD-10-CM | POA: Insufficient documentation

## 2013-06-30 DIAGNOSIS — J449 Chronic obstructive pulmonary disease, unspecified: Secondary | ICD-10-CM | POA: Insufficient documentation

## 2013-06-30 DIAGNOSIS — Z96669 Presence of unspecified artificial ankle joint: Secondary | ICD-10-CM | POA: Insufficient documentation

## 2013-06-30 DIAGNOSIS — E7800 Pure hypercholesterolemia, unspecified: Secondary | ICD-10-CM | POA: Insufficient documentation

## 2013-06-30 HISTORY — DX: Reserved for inherently not codable concepts without codable children: IMO0001

## 2013-06-30 HISTORY — DX: Alcohol dependence, uncomplicated: F10.20

## 2013-06-30 HISTORY — DX: Opioid abuse, uncomplicated: F11.10

## 2013-06-30 LAB — URINALYSIS, ROUTINE W REFLEX MICROSCOPIC
Bilirubin Urine: NEGATIVE
Glucose, UA: NEGATIVE mg/dL
Hgb urine dipstick: NEGATIVE
Ketones, ur: NEGATIVE mg/dL
Leukocytes, UA: NEGATIVE
Nitrite: NEGATIVE
Protein, ur: NEGATIVE mg/dL
Specific Gravity, Urine: 1.026 (ref 1.005–1.030)
Urobilinogen, UA: 0.2 mg/dL (ref 0.0–1.0)
pH: 5.5 (ref 5.0–8.0)

## 2013-06-30 LAB — CBC WITH DIFFERENTIAL/PLATELET
Basophils Absolute: 0 10*3/uL (ref 0.0–0.1)
Basophils Relative: 0 % (ref 0–1)
Eosinophils Absolute: 0 10*3/uL (ref 0.0–0.7)
Eosinophils Relative: 0 % (ref 0–5)
HCT: 40 % (ref 39.0–52.0)
Hemoglobin: 13.9 g/dL (ref 13.0–17.0)
Lymphocytes Relative: 14 % (ref 12–46)
Lymphs Abs: 2.1 10*3/uL (ref 0.7–4.0)
MCH: 30.3 pg (ref 26.0–34.0)
MCHC: 34.8 g/dL (ref 30.0–36.0)
MCV: 87.3 fL (ref 78.0–100.0)
Monocytes Absolute: 1.4 10*3/uL — ABNORMAL HIGH (ref 0.1–1.0)
Monocytes Relative: 9 % (ref 3–12)
Neutro Abs: 11.8 10*3/uL — ABNORMAL HIGH (ref 1.7–7.7)
Neutrophils Relative %: 77 % (ref 43–77)
Platelets: 412 10*3/uL — ABNORMAL HIGH (ref 150–400)
RBC: 4.58 MIL/uL (ref 4.22–5.81)
RDW: 16.6 % — ABNORMAL HIGH (ref 11.5–15.5)
WBC: 15.2 10*3/uL — ABNORMAL HIGH (ref 4.0–10.5)

## 2013-06-30 LAB — BASIC METABOLIC PANEL
BUN: 14 mg/dL (ref 6–23)
CO2: 23 mEq/L (ref 19–32)
Calcium: 9.1 mg/dL (ref 8.4–10.5)
Chloride: 94 mEq/L — ABNORMAL LOW (ref 96–112)
Creatinine, Ser: 0.53 mg/dL (ref 0.50–1.35)
GFR calc Af Amer: >90 mL/min (ref >90)
GFR calc non Af Amer: >90 mL/min (ref >90)
Glucose, Bld: 120 mg/dL — ABNORMAL HIGH (ref 70–99)
Potassium: 4.4 mEq/L (ref 3.7–5.3)
Sodium: 133 mEq/L — ABNORMAL LOW (ref 137–147)

## 2013-06-30 LAB — I-STAT CG4 LACTIC ACID, ED: Lactic Acid, Venous: 1.52 mmol/L (ref 0.5–2.2)

## 2013-06-30 MED ORDER — METOCLOPRAMIDE HCL 5 MG/ML IJ SOLN
10.0000 mg | Freq: Once | INTRAMUSCULAR | Status: AC
  Administered 2013-06-30: 10 mg via INTRAVENOUS
  Filled 2013-06-30: qty 2

## 2013-06-30 MED ORDER — ACETAMINOPHEN 325 MG PO TABS
650.0000 mg | ORAL_TABLET | Freq: Once | ORAL | Status: AC
  Administered 2013-06-30: 650 mg via ORAL
  Filled 2013-06-30: qty 2

## 2013-06-30 MED ORDER — DEXAMETHASONE SODIUM PHOSPHATE 10 MG/ML IJ SOLN
10.0000 mg | Freq: Once | INTRAMUSCULAR | Status: AC
  Administered 2013-06-30: 10 mg via INTRAVENOUS
  Filled 2013-06-30: qty 1

## 2013-06-30 MED ORDER — PROMETHAZINE HCL 25 MG PO TABS: 25.0000 mg | ORAL_TABLET | Freq: Four times a day (QID) | ORAL | Status: DC | PRN

## 2013-06-30 MED ORDER — IBUPROFEN 400 MG PO TABS
600.0000 mg | ORAL_TABLET | Freq: Once | ORAL | Status: AC
  Administered 2013-06-30: 600 mg via ORAL
  Filled 2013-06-30 (×2): qty 1

## 2013-06-30 MED ORDER — SODIUM CHLORIDE 0.9 % IV BOLUS (SEPSIS)
1000.0000 mL | Freq: Once | INTRAVENOUS | Status: AC
  Administered 2013-06-30: 1000 mL via INTRAVENOUS

## 2013-06-30 MED ORDER — DIPHENHYDRAMINE HCL 50 MG/ML IJ SOLN
25.0000 mg | Freq: Once | INTRAMUSCULAR | Status: AC
  Administered 2013-06-30: 25 mg via INTRAVENOUS
  Filled 2013-06-30: qty 1

## 2013-06-30 NOTE — ED Notes (Signed)
Pt. In waiting.  I informed RN-Haley Ringley at Nurse 1st of normal CG-4 result.

## 2013-06-30 NOTE — ED Notes (Signed)
Pt reports h/a since this morning took 2 doses of advil and claritin with some relief. Reports fever of 100.9. Was hospitalized last week with similar s/s sodium level found to be low and was admitted. Pt denies any CP or SOB. No neuro deficits. Pt is soft spoken reports he lost his voice. Daughter is at bedside.

## 2013-06-30 NOTE — ED Notes (Signed)
Pt informed of need for urine sample. Pt reports he just went before he came back to room but will try in 15 minutes.

## 2013-06-30 NOTE — ED Notes (Signed)
Pt family states pt was recently taken off of oxycodone. Pt has h/o drug addiction per family.

## 2013-06-30 NOTE — ED Notes (Signed)
Patient transported to CT 

## 2013-06-30 NOTE — Discharge Instructions (Signed)
Take tylenol as needed for fever.  You can alternate w/ 200-482m ibuprofen if necessary.   Try phenergan for headache.  Take w/ 264mof benadryl.  Drink plenty of fluids. Follow up with your neurologist as scheduled.  Return to the ER if you headache worsens or you develop any other concerning symptoms.

## 2013-06-30 NOTE — Telephone Encounter (Signed)
5 or 10 mg ?

## 2013-06-30 NOTE — Telephone Encounter (Signed)
For migraine - Maxalt MLT take one for headache may repeat in 2 hrs.  #6 refill x 2

## 2013-06-30 NOTE — ED Provider Notes (Signed)
CSN: [id number]     Arrival date & time 06/30/13  1412 History   First MD Initiated Contact with Patient 06/30/13 1728     Chief Complaint  Patient presents with  . Headache  . Fever     (Consider location/radiation/quality/duration/timing/severity/associated sxs/prior Treatment) Patient is a 68 y.o. male presenting with headaches and fever. The history is provided by the patient.  Headache Associated symptoms: fever and myalgias   Associated symptoms: no abdominal pain, no congestion, no diarrhea, no nausea and no vomiting   Fever Associated symptoms: headaches and myalgias   Associated symptoms: no chest pain, no confusion, no congestion, no diarrhea, no dysuria, no nausea and no vomiting    patient with onset of fever this morning the template to 147.8. And a headache. Headache is mild not severe. Also associated with some bodyaches. No upper respiratory symptoms no nausea vomiting or diarrhea no bowel pain. No worsening of symptoms throughout the day. Patient's main concern was is similar symptoms before when his sodium was low and that's why they came in.  Past Medical History  Diagnosis Date  . Hx of colonic polyps   . Diverticulosis   . Hypertension   . 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 CAD (coronary artery disease)     minimal coronary plaque in the LAD and right coronary system. 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 7-06. Last nuclear stress 11-17-06 fixed anterior/ inferior defect, no inducible ischemia, EF 81%  . Allergic rhinitis   . Hx of colonoscopy   . Dysrhythmia 01-24-12    past hx. A.Fib x1 episode-responded to med.  . Depression   . GERD (gastroesophageal reflux disease)   . COPD (chronic obstructive pulmonary disease)     pt denies this hx on 05/24/2013  . Osteoarthritis   . RA (rheumatoid arthritis)   . Chronic back pain     "all over back"  (06/16/2013)  . PONV (postoperative nausea and vomiting)   . High cholesterol   . Sleep apnea     "never dx'd; snores bad" (06/16/2013)  . Daily headache   . Falls frequently     "since 02/2013" (06/16/2013)  . Anxiety   . Narcotic abuse     per family  . Alcoholism /alcohol abuse     per family  . Dementia    Past Surgical History  Procedure Laterality Date  . Appendectomy    . Total knee arthroplasty Right 2002     partial  . Shoulder hemi-arthroplasty Left 2008    partial  . Foot surgery Right 11-08    for removal of bone spurs-  . Ankle reconstruction Right 6-09    [facility]  . Posterior lumbar fusion  12-10    L4-5 diskectomy w/ fusion, cage placement and rods; [facility]  . Partial knee arthroplasty Left   . Total shoulder replacement Right   . Joint replacement    . Cataract extraction, bilateral Bilateral 01-24-12  . Orif shoulder fracture  02/06/2012    Procedure: OPEN REDUCTION INTERNAL FIXATION (ORIF) SHOULDER FRACTURE;  Surgeon: Nita Sells, MD;  Location: [facility];  Service: Orthopedics;  Laterality: Left;  ORIF of a Left Shoulder Fracture with  Iliac Crest Bone Graft aspiration   . Harvest bone graft  02/06/2012    Procedure: HARVEST ILIAC BONE GRAFT;  Surgeon: Nita Sells, MD;  Location: [facility];  Service: Orthopedics;;  bone marrow aspirqation   . Hardware removal  03/09/2012    Procedure: HARDWARE REMOVAL;  Surgeon: Nita Sells, MD;  Location: [place];  Service: Orthopedics;  Laterality: Left;  Hardware Removal from Left Shoulder  . Shoulder arthroscopy Right 2013  . Carpal tunnel release Right 1990's  . Total ankle replacement Right 2008    at [facility]  . Hammer toe surgery Right 07/2012    "broke 4 hammertoes"   . Posterior lumbar fusion 4 level N/A 03/02/2013    Procedure: Lumbar One to Sacral One Posterior lumbar interbody fusion;  Surgeon: Floyce Stakes, MD;  Location: [place] [facility];  Service: Neurosurgery;   Laterality: N/A;  L1 to S1 Posterior lumbar interbody fusion  . Lumbar wound debridement N/A 03/17/2013    Procedure: Incision and drainage of superficial lumbar wound;  Surgeon: Floyce Stakes, MD;  Location: [place] [facility];  Service: Neurosurgery;  Laterality: N/A;  Incision and drainage of superficial lumbar wound  . Tonsillectomy    . Coronary angioplasty with stent placement  2006    "while repairing 1st stent, a second area tore and they had to place 2nd stent " ?LAD & CX  . Inguinal hernia repair Bilateral    Family History  Problem Relation Age of Onset  . Uterine cancer Mother     survivor  . Macular degeneration Mother   . Other Mother     ankle edema  . Lung cancer Mother   . Cancer Mother     ovarian, lung  . Coronary artery disease Father   . Hypertension Father   . Prostate cancer Father   . Colon polyps Father   . Cancer Father     prostate  . Thyroid disease Sister   . Heart attack Brother   . Hyperlipidemia Brother   . Other Brother     Schizophrenic  . Diabetes Neg Hx   . Colon cancer Neg Hx   . Esophageal cancer Neg Hx   . Rectal cancer Neg Hx   . Stomach cancer Neg Hx   . Coronary artery disease Maternal Aunt   . Heart attack Maternal Aunt   . Rheum arthritis Sister   . Hemochromatosis Sister    History  Substance Use Topics  . Smoking status: Never Smoker   . Smokeless tobacco: Never Used  . Alcohol Use: 0.0 oz/week     Comment: 06/16/2013 "used to drink alot; was pretty bad about it; don't think he's had any alcohol in the past year"    Review of Systems  Constitutional: Positive for fever.  HENT: Negative for congestion.   Respiratory: Negative for shortness of breath.   Cardiovascular: Negative for chest pain.  Gastrointestinal: Negative for nausea, vomiting, abdominal pain and diarrhea.  Genitourinary: Negative for dysuria.  Musculoskeletal: Positive for myalgias.  Neurological: Positive for headaches.  Hematological: Does not  bruise/bleed easily.  Psychiatric/Behavioral: Negative for confusion.      Allergies  Morphine and related; Penicillins; Cefixime; and Percocet  Home Medications   Current Outpatient Rx  Name  Route  Sig  Dispense  Refill  . Aclidinium Bromide (TUDORZA PRESSAIR) 400 MCG/ACT AEPB   Inhalation   Inhale 1 puff into the lungs 2 (two) times daily.   1 each   11   . aspirin (ASPIRIN EC) 81 MG EC tablet   Oral   Take 1 tablet (81 mg total) by mouth every morning.   30 tablet  12   . cholecalciferol (VITAMIN D) 1000 UNITS tablet   Oral   Take 1 tablet (1,000 Units total) by mouth daily.   100 tablet   3   . docusate sodium (COLACE) 100 MG capsule   Oral   Take 1 capsule (100 mg total) by mouth 2 (two) times daily.   60 capsule   0   . FLUoxetine (PROZAC) 20 MG capsule   Oral   Take 40 mg by mouth daily.         . fluticasone (FLONASE) 50 MCG/ACT nasal spray   Each Nare   Place 2 sprays into both nostrils at bedtime.          . gabapentin (NEURONTIN) 300 MG capsule   Oral   Take 2 capsules (600 mg total) by mouth 2 (two) times daily.   60 capsule   1   . HYDROmorphone (DILAUDID) 2 MG tablet   Oral   Take 1 tablet (2 mg total) by mouth every 6 (six) hours as needed for moderate pain or severe pain (to be GIVEN only if in moderate to severe pain, Hold if lethargy and sedation).      0   . Linaclotide (LINZESS) 290 MCG CAPS capsule   Oral   Take 1 capsule (290 mcg total) by mouth daily.   30 capsule   0   . losartan (COZAAR) 50 MG tablet   Oral   Take 1 tablet (50 mg total) by mouth daily.   30 tablet   1   . methocarbamol (ROBAXIN) 500 MG tablet   Oral   Take 1 tablet (500 mg total) by mouth 2 (two) times daily as needed for muscle spasms (for moderate to severe muscle spasms only, also hold if drowsy).      0   . metoprolol tartrate (LOPRESSOR) 25 MG tablet   Oral   Take 1 tablet (25 mg total) by mouth 2 (two) times daily.   60 tablet   1    . pantoprazole (PROTONIX) 20 MG tablet      TAKE 2 TABLETS BY MOUTH TWICE A DAY   120 tablet   3   . Polyethyl Glycol-Propyl Glycol (SYSTANE ULTRA) 0.4-0.3 % SOLN   Both Eyes   Place 2 drops into both eyes 2 (two) times daily as needed (dry eyes).          . polyethylene glycol (MIRALAX / GLYCOLAX) packet   Oral   Take 17 g by mouth daily as needed for moderate constipation.   14 each   1   . predniSONE (DELTASONE) 5 MG tablet   Oral   Take 1 tablet (5 mg total) by mouth every evening.   30 tablet   1   . simvastatin (ZOCOR) 40 MG tablet   Oral   Take 1 tablet (40 mg total) by mouth daily with lunch.   30 tablet   1    BP 166/91  Pulse 105  Temp(Src) 100 F (37.8 C) (Oral)  Resp 25  SpO2 96% Physical Exam  Nursing note and vitals reviewed. Constitutional: He is oriented to person, place, and time. He appears well-developed and well-nourished. No distress.  HENT:  Head: Normocephalic and atraumatic.  Mouth/Throat: Oropharynx is clear and moist.  Eyes: Conjunctivae and EOM are normal. Pupils are equal, round, and reactive to light.  Neck: Normal range of motion.  Cardiovascular: Normal rate, regular rhythm and normal heart sounds.   Pulmonary/Chest: Effort normal and  breath sounds normal. No respiratory distress.  Abdominal: Soft. Bowel sounds are normal. There is no tenderness.  Musculoskeletal: Normal range of motion. He exhibits no edema.  Neurological: He is alert and oriented to person, place, and time. No cranial nerve deficit. He exhibits normal muscle tone. Coordination normal.  Skin: Skin is warm. No rash noted.    ED Course  Procedures (including critical care time) Labs Review Labs Reviewed  CBC WITH DIFFERENTIAL - Abnormal; Notable for the following:    WBC 15.2 (*)    RDW 16.6 (*)    Platelets 412 (*)    Neutro Abs 11.8 (*)    Monocytes Absolute 1.4 (*)    All other components within normal limits  BASIC METABOLIC PANEL - Abnormal; Notable  for the following:    Sodium 133 (*)    Chloride 94 (*)    Glucose, Bld 120 (*)    All other components within normal limits  URINALYSIS, ROUTINE W REFLEX MICROSCOPIC  I-STAT CG4 LACTIC ACID, ED   Results for orders placed during the hospital encounter of 06/30/13  CBC WITH DIFFERENTIAL      Result Value Ref Range   WBC 15.2 (*) 4.0 - 10.5 K/uL   RBC 4.58  4.22 - 5.81 MIL/uL   Hemoglobin 13.9  13.0 - 17.0 g/dL   HCT 40.0  39.0 - 52.0 %   MCV 87.3  78.0 - 100.0 fL   MCH 30.3  26.0 - 34.0 pg   MCHC 34.8  30.0 - 36.0 g/dL   RDW 16.6 (*) 11.5 - 15.5 %   Platelets 412 (*) 150 - 400 K/uL   Neutrophils Relative % 77  43 - 77 %   Neutro Abs 11.8 (*) 1.7 - 7.7 K/uL   Lymphocytes Relative 14  12 - 46 %   Lymphs Abs 2.1  0.7 - 4.0 K/uL   Monocytes Relative 9  3 - 12 %   Monocytes Absolute 1.4 (*) 0.1 - 1.0 K/uL   Eosinophils Relative 0  0 - 5 %   Eosinophils Absolute 0.0  0.0 - 0.7 K/uL   Basophils Relative 0  0 - 1 %   Basophils Absolute 0.0  0.0 - 0.1 K/uL  BASIC METABOLIC PANEL      Result Value Ref Range   Sodium 133 (*) 137 - 147 mEq/L   Potassium 4.4  3.7 - 5.3 mEq/L   Chloride 94 (*) 96 - 112 mEq/L   CO2 23  19 - 32 mEq/L   Glucose, Bld 120 (*) 70 - 99 mg/dL   BUN 14  6 - 23 mg/dL   Creatinine, Ser 0.53  0.50 - 1.35 mg/dL   Calcium 9.1  8.4 - 10.5 mg/dL   GFR calc non Af Amer >90  >90 mL/min   GFR calc Af Amer >90  >90 mL/min  URINALYSIS, ROUTINE W REFLEX MICROSCOPIC      Result Value Ref Range   Color, Urine YELLOW  YELLOW   APPearance CLEAR  CLEAR   Specific Gravity, Urine 1.026  1.005 - 1.030   pH 5.5  5.0 - 8.0   Glucose, UA NEGATIVE  NEGATIVE mg/dL   Hgb urine dipstick NEGATIVE  NEGATIVE   Bilirubin Urine NEGATIVE  NEGATIVE   Ketones, ur NEGATIVE  NEGATIVE mg/dL   Protein, ur NEGATIVE  NEGATIVE mg/dL   Urobilinogen, UA 0.2  0.0 - 1.0 mg/dL   Nitrite NEGATIVE  NEGATIVE   Leukocytes, UA NEGATIVE  NEGATIVE  I-STAT  CG4 LACTIC ACID, ED      Result Value Ref Range    Lactic Acid, Venous 1.52  0.5 - 2.2 mmol/L    Imaging Review Dg Chest 2 View  06/30/2013   CLINICAL DATA:  Small probe, weakness and tiredness.  EXAM: CHEST  2 VIEW  COMPARISON:  06/16/2013  FINDINGS: There is perihilar and medial right lung base atelectasis. Lungs are otherwise clear.  Elevated right hemidiaphragm is stable.  No pleural effusion.  No pneumothorax.  Cardiac silhouette is normal in size. Normal mediastinal and hilar contours.  Bilateral shoulder prostheses are well aligned. There has been previous lumbar spine fusion surgery.  IMPRESSION: No acute cardiopulmonary disease.   Electronically Signed   By: Lajean Manes M.D.   On: 06/30/2013 16:01     EKG Interpretation None      MDM   Final diagnoses:  Fever    Patient with complaint of fever temp to 100.9. Mild headache some body aches. All suggestive of a viral type infection developing. No sore throat no congestion no nausea vomiting or diarrhea. Patient nontoxic no acute distress. Head CT ordered just to be complete. They thought that he was having trouble with hyponatremia again but his sodium levels here are fine. If head CT is negative patient can be discharged home. Cautions provided for anything newer worse developing. Nothing specifically to treat currently. Headache was treated with Tylenol with success.    Mervin Kung, MD 06/30/13 [phone]

## 2013-06-30 NOTE — Telephone Encounter (Signed)
The patient called and stated he is experiencing a migraine and is hoping to get something called in for pain relief.

## 2013-07-01 ENCOUNTER — Ambulatory Visit (INDEPENDENT_AMBULATORY_CARE_PROVIDER_SITE_OTHER): Payer: Medicare Other | Admitting: Internal Medicine

## 2013-07-01 ENCOUNTER — Encounter: Payer: Self-pay | Admitting: Internal Medicine

## 2013-07-01 ENCOUNTER — Emergency Department (HOSPITAL_COMMUNITY)
Admission: EM | Admit: 2013-07-01 | Discharge: 2013-07-01 | Disposition: A | Discharge status: Home / Self Care | Payer: Medicare Other | Attending: Emergency Medicine | Admitting: Emergency Medicine

## 2013-07-01 ENCOUNTER — Encounter (HOSPITAL_COMMUNITY): Payer: Self-pay | Admitting: Emergency Medicine

## 2013-07-01 VITALS — BP 144/86 | HR 117 | Temp 98.4°F | Wt 124.2 lb

## 2013-07-01 DIAGNOSIS — F1110 Opioid abuse, uncomplicated: Secondary | ICD-10-CM

## 2013-07-01 DIAGNOSIS — K219 Gastro-esophageal reflux disease without esophagitis: Secondary | ICD-10-CM | POA: Insufficient documentation

## 2013-07-01 DIAGNOSIS — Z7982 Long term (current) use of aspirin: Secondary | ICD-10-CM | POA: Insufficient documentation

## 2013-07-01 DIAGNOSIS — IMO0002 Reserved for concepts with insufficient information to code with codable children: Secondary | ICD-10-CM | POA: Insufficient documentation

## 2013-07-01 DIAGNOSIS — G8929 Other chronic pain: Secondary | ICD-10-CM | POA: Insufficient documentation

## 2013-07-01 DIAGNOSIS — Z9181 History of falling: Secondary | ICD-10-CM

## 2013-07-01 DIAGNOSIS — Z860100 Personal history of colon polyps, unspecified: Secondary | ICD-10-CM | POA: Insufficient documentation

## 2013-07-01 DIAGNOSIS — E7800 Pure hypercholesterolemia, unspecified: Secondary | ICD-10-CM | POA: Insufficient documentation

## 2013-07-01 DIAGNOSIS — Z9861 Coronary angioplasty status: Secondary | ICD-10-CM | POA: Insufficient documentation

## 2013-07-01 DIAGNOSIS — S0180XA Unspecified open wound of other part of head, initial encounter: Secondary | ICD-10-CM | POA: Insufficient documentation

## 2013-07-01 DIAGNOSIS — Z862 Personal history of diseases of the blood and blood-forming organs and certain disorders involving the immune mechanism: Secondary | ICD-10-CM | POA: Insufficient documentation

## 2013-07-01 DIAGNOSIS — J449 Chronic obstructive pulmonary disease, unspecified: Secondary | ICD-10-CM | POA: Insufficient documentation

## 2013-07-01 DIAGNOSIS — R296 Repeated falls: Secondary | ICD-10-CM

## 2013-07-01 DIAGNOSIS — G44209 Tension-type headache, unspecified, not intractable: Secondary | ICD-10-CM

## 2013-07-01 DIAGNOSIS — Z880 Allergy status to penicillin: Secondary | ICD-10-CM | POA: Insufficient documentation

## 2013-07-01 DIAGNOSIS — M069 Rheumatoid arthritis, unspecified: Secondary | ICD-10-CM | POA: Insufficient documentation

## 2013-07-01 DIAGNOSIS — Y92009 Unspecified place in unspecified non-institutional (private) residence as the place of occurrence of the external cause: Secondary | ICD-10-CM | POA: Insufficient documentation

## 2013-07-01 DIAGNOSIS — Z79899 Other long term (current) drug therapy: Secondary | ICD-10-CM | POA: Insufficient documentation

## 2013-07-01 DIAGNOSIS — F1190 Opioid use, unspecified, uncomplicated: Secondary | ICD-10-CM

## 2013-07-01 DIAGNOSIS — W1809XA Striking against other object with subsequent fall, initial encounter: Secondary | ICD-10-CM | POA: Insufficient documentation

## 2013-07-01 DIAGNOSIS — F329 Major depressive disorder, single episode, unspecified: Secondary | ICD-10-CM | POA: Insufficient documentation

## 2013-07-01 DIAGNOSIS — Y9389 Activity, other specified: Secondary | ICD-10-CM | POA: Insufficient documentation

## 2013-07-01 DIAGNOSIS — I10 Essential (primary) hypertension: Secondary | ICD-10-CM | POA: Insufficient documentation

## 2013-07-01 DIAGNOSIS — M1990 Unspecified osteoarthritis, unspecified site: Secondary | ICD-10-CM | POA: Insufficient documentation

## 2013-07-01 DIAGNOSIS — W06XXXA Fall from bed, initial encounter: Secondary | ICD-10-CM | POA: Insufficient documentation

## 2013-07-01 DIAGNOSIS — S0181XA Laceration without foreign body of other part of head, initial encounter: Secondary | ICD-10-CM

## 2013-07-01 DIAGNOSIS — F3289 Other specified depressive episodes: Secondary | ICD-10-CM | POA: Insufficient documentation

## 2013-07-01 DIAGNOSIS — F411 Generalized anxiety disorder: Secondary | ICD-10-CM | POA: Insufficient documentation

## 2013-07-01 DIAGNOSIS — G479 Sleep disorder, unspecified: Secondary | ICD-10-CM

## 2013-07-01 DIAGNOSIS — F0390 Unspecified dementia without behavioral disturbance: Secondary | ICD-10-CM | POA: Insufficient documentation

## 2013-07-01 DIAGNOSIS — I2510 Atherosclerotic heart disease of native coronary artery without angina pectoris: Secondary | ICD-10-CM | POA: Insufficient documentation

## 2013-07-01 DIAGNOSIS — R634 Abnormal weight loss: Secondary | ICD-10-CM

## 2013-07-01 DIAGNOSIS — J4489 Other specified chronic obstructive pulmonary disease: Secondary | ICD-10-CM | POA: Insufficient documentation

## 2013-07-01 MED ORDER — LIDOCAINE-EPINEPHRINE (PF) 2 %-1:200000 IJ SOLN
INTRAMUSCULAR | Status: AC
  Filled 2013-07-01: qty 20

## 2013-07-01 NOTE — Discharge Instructions (Signed)
Facial Laceration  A facial laceration is a cut on the face. These injuries can be painful and cause bleeding. Lacerations usually heal quickly, but they need special care to reduce scarring. DIAGNOSIS  Your health care provider will take a medical history, ask for details about how the injury occurred, and examine the wound to determine how deep the cut is. TREATMENT  Some facial lacerations may not require closure. Others may not be able to be closed because of an increased risk of infection. The risk of infection and the chance for successful closure will depend on various factors, including the amount of time since the injury occurred. The wound may be cleaned to help prevent infection. If closure is appropriate, pain medicines may be given if needed. Your health care provider will use stitches (sutures), wound glue (adhesive), or skin adhesive strips to repair the laceration. These tools bring the skin edges together to allow for faster healing and a better cosmetic outcome. If needed, you may also be given a tetanus shot. HOME CARE INSTRUCTIONS  Only take over-the-counter or prescription medicines as directed by your health care provider.  Follow your health care provider's instructions for wound care. These instructions will vary depending on the technique used for closing the wound. For Sutures:  Keep the wound clean and dry.   If you were given a bandage (dressing), you should change it at least once a day. Also change the dressing if it becomes wet or dirty, or as directed by your health care provider.   Wash the wound with soap and water 2 times a day. Rinse the wound off with water to remove all soap. Pat the wound dry with a clean towel.   After cleaning, apply a thin layer of the antibiotic ointment recommended by your health care provider. This will help prevent infection and keep the dressing from sticking.   You may shower as usual after the first 24 hours. Do not soak the  wound in water until the sutures are removed.   Get your sutures removed as directed by your health care provider. With facial lacerations, sutures should usually be taken out after 4 5 days to avoid stitch marks.   Wait a few days after your sutures are removed before applying any makeup. For Skin Adhesive Strips:  Keep the wound clean and dry.   Do not get the skin adhesive strips wet. You may bathe carefully, using caution to keep the wound dry.   If the wound gets wet, pat it dry with a clean towel.   Skin adhesive strips will fall off on their own. You may trim the strips as the wound heals. Do not remove skin adhesive strips that are still stuck to the wound. They will fall off in time.  For Wound Adhesive:  You may briefly wet your wound in the shower or bath. Do not soak or scrub the wound. Do not swim. Avoid periods of heavy sweating until the skin adhesive has fallen off on its own. After showering or bathing, gently pat the wound dry with a clean towel.   Do not apply liquid medicine, cream medicine, ointment medicine, or makeup to your wound while the skin adhesive is in place. This may loosen the film before your wound is healed.   If a dressing is placed over the wound, be careful not to apply tape directly over the skin adhesive. This may cause the adhesive to be pulled off before the wound is healed.  Avoid prolonged exposure to sunlight or tanning lamps while the skin adhesive is in place.  The skin adhesive will usually remain in place for 5 10 days, then naturally fall off the skin. Do not pick at the adhesive film.  After Healing: Once the wound has healed, cover the wound with sunscreen during the day for 1 full year. This can help minimize scarring. Exposure to ultraviolet light in the first year will darken the scar. It can take 1 2 years for the scar to lose its redness and to heal completely.  SEEK IMMEDIATE MEDICAL CARE IF:  You have redness, pain, or  swelling around the wound.   You see ayellowish-white fluid (pus) coming from the wound.   You have chills or a fever.  MAKE SURE YOU:  Understand these instructions.  Will watch your condition.  Will get help right away if you are not doing well or get worse. Document Released: 05/02/2004 Document Revised: 01/13/2013 Document Reviewed: 11/05/2012 [facility] Patient Information 2014 [place], [place].

## 2013-07-01 NOTE — Progress Notes (Signed)
Pre visit review using our clinic review tool, if applicable. No additional management support is needed unless otherwise documented below in the visit note. 

## 2013-07-01 NOTE — ED Notes (Signed)
Pt fell off bed reaching for candy and hit his head on side table. Denies LOC.  2 cm laceration sustained above right eye brow. Bleeding currently controlled.  Reports 4/10 pain in wound. Denies other injuries.

## 2013-07-01 NOTE — ED Provider Notes (Signed)
CSN: [id number]     Arrival date & time 07/01/13  1749 History   First MD Initiated Contact with Patient 07/01/13 1935     Chief Complaint  Patient presents with  . Fall     (Consider location/radiation/quality/duration/timing/severity/associated sxs/prior Treatment) Patient is a 68 y.o. male presenting with fall. The history is provided by the patient.  Fall This is a new problem. Pertinent negatives include no abdominal pain and no headaches.   patient with a fall at home. He was reaching for some candy out of his bed and hit his head on a bedside table. He was conscious. He does have laceration to his left eyebrow area. No neck pain. No headache. No confusion. He was seen in ER yesterday for a fever. He is not on anticoagulation. Past Medical History  Diagnosis Date  . Hx of colonic polyps   . Diverticulosis   . Hypertension   . 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 CAD (coronary artery disease)     minimal coronary plaque in the LAD and right coronary system. 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 7-06. Last nuclear stress 11-17-06 fixed anterior/ inferior defect, no inducible ischemia, EF 81%  . Allergic rhinitis   . Hx of colonoscopy   . Dysrhythmia 01-24-12    past hx. A.Fib x1 episode-responded to med.  . Depression   . GERD (gastroesophageal reflux disease)   . COPD (chronic obstructive pulmonary disease)     pt denies this hx on 05/24/2013  . Osteoarthritis   . RA (rheumatoid arthritis)   . Chronic back pain     "all over back" (06/16/2013)  . PONV (postoperative nausea and vomiting)   . High cholesterol   . Sleep apnea     "never dx'd; snores bad" (06/16/2013)  . Daily headache   . Falls frequently     "since 02/2013" (06/16/2013)  . Anxiety   . Narcotic abuse     per family  . Alcoholism /alcohol abuse     per family  . Dementia    Past Surgical  History  Procedure Laterality Date  . Appendectomy    . Total knee arthroplasty Right 2002     partial  . Shoulder hemi-arthroplasty Left 2008    partial  . Foot surgery Right 11-08    for removal of bone spurs-  . Ankle reconstruction Right 6-09    [facility]  . Posterior lumbar fusion  12-10    L4-5 diskectomy w/ fusion, cage placement and rods; [facility]  . Partial knee arthroplasty Left   . Total shoulder replacement Right   . Joint replacement    . Cataract extraction, bilateral Bilateral 01-24-12  . Orif shoulder fracture  02/06/2012    Procedure: OPEN REDUCTION INTERNAL FIXATION (ORIF) SHOULDER FRACTURE;  Surgeon: Nita Sells, MD;  Location: [facility];  Service: Orthopedics;  Laterality: Left;  ORIF of a Left Shoulder Fracture with  Iliac Crest Bone Graft aspiration   . Harvest bone graft  02/06/2012    Procedure: HARVEST ILIAC BONE GRAFT;  Surgeon: Nita Sells, MD;  Location: [facility];  Service: Orthopedics;;  bone marrow aspirqation   . Hardware removal  03/09/2012    Procedure: HARDWARE REMOVAL;  Surgeon: Nita Sells, MD;  Location: [place];  Service: Orthopedics;  Laterality: Left;  Hardware Removal from Left Shoulder  .  Shoulder arthroscopy Right 2013  . Carpal tunnel release Right 1990's  . Total ankle replacement Right 2008    at [facility]  . Hammer toe surgery Right 07/2012    "broke 4 hammertoes"   . Posterior lumbar fusion 4 level N/A 03/02/2013    Procedure: Lumbar One to Sacral One Posterior lumbar interbody fusion;  Surgeon: Floyce Stakes, MD;  Location: [facility];  Service: Neurosurgery;  Laterality: N/A;  L1 to S1 Posterior lumbar interbody fusion  . Lumbar wound debridement N/A 03/17/2013    Procedure: Incision and drainage of superficial lumbar wound;  Surgeon: Floyce Stakes, MD;  Location: [place] [facility];  Service: Neurosurgery;  Laterality: N/A;  Incision and drainage of superficial lumbar wound  . Tonsillectomy     . Coronary angioplasty with stent placement  2006    "while repairing 1st stent, a second area tore and they had to place 2nd stent " ?LAD & CX  . Inguinal hernia repair Bilateral    Family History  Problem Relation Age of Onset  . Uterine cancer Mother     survivor  . Macular degeneration Mother   . Other Mother     ankle edema  . Lung cancer Mother   . Cancer Mother     ovarian, lung  . Coronary artery disease Father   . Hypertension Father   . Prostate cancer Father   . Colon polyps Father   . Cancer Father     prostate  . Thyroid disease Sister   . Heart attack Brother   . Hyperlipidemia Brother   . Other Brother     Schizophrenic  . Diabetes Neg Hx   . Colon cancer Neg Hx   . Esophageal cancer Neg Hx   . Rectal cancer Neg Hx   . Stomach cancer Neg Hx   . Coronary artery disease Maternal Aunt   . Heart attack Maternal Aunt   . Rheum arthritis Sister   . Hemochromatosis Sister    History  Substance Use Topics  . Smoking status: Never Smoker   . Smokeless tobacco: Never Used  . Alcohol Use: 0.0 oz/week     Comment: 06/16/2013 "used to drink alot; was pretty bad about it; don't think he's had any alcohol in the past year"    Review of Systems  Constitutional: Positive for fever.  Respiratory: Positive for cough.   Gastrointestinal: Negative for abdominal pain.  Musculoskeletal: Negative for back pain and neck pain.  Skin: Positive for wound.  Neurological: Negative for headaches.  Psychiatric/Behavioral: Negative for confusion.      Allergies  Morphine and related; Penicillins; Cefixime; and Percocet  Home Medications   Current Outpatient Rx  Name  Route  Sig  Dispense  Refill  . Aclidinium Bromide (TUDORZA PRESSAIR) 400 MCG/ACT AEPB   Inhalation   Inhale 1 puff into the lungs 2 (two) times daily.   1 each   11   . aspirin (ASPIRIN EC) 81 MG EC tablet   Oral   Take 1 tablet (81 mg total) by mouth every morning.   30 tablet   12   .  cholecalciferol (VITAMIN D) 1000 UNITS tablet   Oral   Take 1 tablet (1,000 Units total) by mouth daily.   100 tablet   3   . docusate sodium (COLACE) 100 MG capsule   Oral   Take 1 capsule (100 mg total) by mouth 2 (two) times daily.   60 capsule   0   .  FLUoxetine (PROZAC) 20 MG capsule   Oral   Take 40 mg by mouth daily.         . fluticasone (FLONASE) 50 MCG/ACT nasal spray   Each Nare   Place 2 sprays into both nostrils at bedtime.          . gabapentin (NEURONTIN) 300 MG capsule   Oral   Take 2 capsules (600 mg total) by mouth 2 (two) times daily.   60 capsule   1   . losartan (COZAAR) 50 MG tablet   Oral   Take 1 tablet (50 mg total) by mouth daily.   30 tablet   1   . metoprolol tartrate (LOPRESSOR) 25 MG tablet   Oral   Take 1 tablet (25 mg total) by mouth 2 (two) times daily.   60 tablet   1   . pantoprazole (PROTONIX) 20 MG tablet   Oral   Take 40 mg by mouth 2 (two) times daily.         . predniSONE (DELTASONE) 5 MG tablet   Oral   Take 1 tablet (5 mg total) by mouth every evening.   30 tablet   1   . simvastatin (ZOCOR) 40 MG tablet   Oral   Take 1 tablet (40 mg total) by mouth daily with lunch.   30 tablet   1   . Polyethyl Glycol-Propyl Glycol (SYSTANE ULTRA) 0.4-0.3 % SOLN   Both Eyes   Place 2 drops into both eyes 2 (two) times daily as needed (dry eyes).          . promethazine (PHENERGAN) 25 MG tablet   Oral   Take 1 tablet (25 mg total) by mouth every 6 (six) hours as needed for nausea or vomiting.   20 tablet   0    BP 148/88  Pulse 113  Temp(Src) 100.2 F (37.9 C) (Oral)  Resp 18  Wt 124 lb (56.246 kg)  SpO2 98% Physical Exam  Constitutional: He is oriented to person, place, and time. He appears well-developed.  HENT:  Head: Normocephalic.  2.5 cm laceration across left eyebrow. No underlying bony tenderness. Extraocular movements intact.  Cardiovascular: Normal rate and regular rhythm.   Pulmonary/Chest:  Effort normal and breath sounds normal.  Occasional cough  Abdominal: There is no tenderness.  Musculoskeletal: He exhibits no edema.  Neurological: He is alert and oriented to person, place, and time.    ED Course  Procedures (including critical care time) Labs Review Labs Reviewed - No data to display Imaging Review Dg Chest 2 View  06/30/2013   CLINICAL DATA:  Small probe, weakness and tiredness.  EXAM: CHEST  2 VIEW  COMPARISON:  06/16/2013  FINDINGS: There is perihilar and medial right lung base atelectasis. Lungs are otherwise clear.  Elevated right hemidiaphragm is stable.  No pleural effusion.  No pneumothorax.  Cardiac silhouette is normal in size. Normal mediastinal and hilar contours.  Bilateral shoulder prostheses are well aligned. There has been previous lumbar spine fusion surgery.  IMPRESSION: No acute cardiopulmonary disease.   Electronically Signed   By: Lajean Manes M.D.   On: 06/30/2013 16:01   Ct Head Wo Contrast  06/30/2013   CLINICAL DATA:  HEADACHE FEVER  EXAM: CT HEAD WITHOUT CONTRAST  TECHNIQUE: Contiguous axial images were obtained from the base of the skull through the vertex without intravenous contrast.  COMPARISON:  CT HEAD W/O CM dated 06/16/2013  FINDINGS: No acute intracranial abnormality. Specifically, no hemorrhage, hydrocephalus,  mass lesion, acute infarction, or significant intracranial injury. No acute calvarial abnormality. Stable areas of low attenuation within the subcortical, deep, and periventricular white matter regions. Stable global atrophy. The visualized paranasal sinuses and mastoid air cells are patent.  IMPRESSION: Involutional and chronic changes without evidence of focal or acute abnormalities.   Electronically Signed   By: Margaree Mackintosh M.D.   On: 06/30/2013 20:40     EKG Interpretation None     LACERATION REPAIR Performed by: Mackie Pai. Authorized by: Mackie Pai Consent: Verbal consent obtained. Risks and benefits:  risks, benefits and alternatives were discussed Consent given by: patient Patient identity confirmed: provided demographic data Prepped and Draped in normal sterile fashion Wound explored  Laceration Location: Left eyebrow  Laceration Length: 2.5 cm  No Foreign Bodies seen or palpated  Anesthesia: local infiltration  Local anesthetic: lidocaine 2 % with epinephrine  Anesthetic total: 2 ml  Irrigation method: syringe Amount of cleaning: standard  Skin closure: 4-0 Vicryl Rapide   Number of sutures: 4   Technique: Simple interrupted   Patient tolerance: Patient tolerated the procedure well with no immediate complications.  MDM   Final diagnoses:  Facial laceration    Patient with facial laceration. Tetanus up-to-date. Wound was closed. Will followup with PCP. Patient had extensive workup for fever yesterday.    Jasper Riling. Alvino Chapel, MD 07/01/13 2111

## 2013-07-01 NOTE — Patient Instructions (Signed)
Good to see you out of the hospital!!!  1. Headache - sounds like a tension headache Plan Ok to use the aleve, up to a max dose of 2 tabs every 12 hours. Watch for GI effects  Referral to Dr. Michel Santee - we will take care of this.  2. Sleep - sleep hygiene below is key. Plan  Sleep is a learned or unlearned behavior. 5 principles of sleep hygiene - 1) regular hour to retire and rise 7days/wk 2) no stimulants - caffeine, chocolat, alcohol, 3) regular exercise  - every afternoon  4) sleep sanctuary - a space that is right light, temperature, sound level, good bed where all you do is sleep. 5) No extinction behaviors, e.g. Laying in bed awake doing anything but sleeping. This means if you have a bad night - no naps, etc  Herbal - melatonin, camomille tea, warm milk  Rx - trazodone 50 mg at bedtime is ok  3. Bowel habit - nature's way is the best way, i.e. A bulk laxative, e.g. Metamucil, benefiber, etc. Probiotics are OK  4. Pain control - YEAAH to moving away from narcotics!!!!!!! You will be better without them even if you have pain.  Plan Remain of if possible  Alternative - accupuncture

## 2013-07-02 ENCOUNTER — Telehealth: Payer: Self-pay

## 2013-07-02 NOTE — Telephone Encounter (Signed)
FYI:: PT @ [facility] called to inform you that patient fell on 3/26. He had to go to the ER. Patient had a laceration above his left eye which  Required 4 sutures. Patient is back at home and received therapy today.

## 2013-07-03 NOTE — Discharge Summary (Addendum)
Physician Discharge Summary  [name] QIH:[id number] DOB: 04-15-1945 DOA: 06/16/2013  PCP: Sonda Primes, MD  Admit date: 06/16/2013 Discharge date: 06/18/2013  Time spent: 45 minutes  Recommendations for Outpatient Follow-up:  1. Bmet in 1 week, monitor Sodium 2. PCP Dr.Norins in 1 week  Discharge Diagnoses:  Principal Problem:   Altered mental status Active Problems:   Hyponatremia/SIADH   Polypharmacy   H/o Lumbar fusion   Lumbar wound with h/o debridement  Discharge Condition: stable  Diet recommendation: regular  Filed Weights   06/16/13 2148 06/18/13 0553  Weight: 51.7 kg (113 lb 15.7 oz) 49.9 kg (110 lb 0.2 oz)    History of present illness:  [name] is a 68 y.o. male, L1-S1 fusion, lumbar surgery recently discharged from the hospital after he was ruled out for an infection presenting with altered mental status.According to the son the patient does good when he is on the hospital, but when he goes home his situation gets worse. The patient has recently started living with his son who monitors his pain medications . Yesterday the patient was complaining of headaches and he received Benadryl and today his mental status got worse and did not want to participate in any physical therapy. He had history of falls. CT of the head in the ER was negative however his white blood cell count was found to be elevated I was called to admit for possible infection. His chest x-ray and urine were both negative . Patient denies any pain in his back and his family reports that the wound has been clean    Hospital Course:  1. Altered mental status/metabolic encephalopathy  -due to hyponatremia and  polypharmacy  -improved to baseline  -d/w son, minimize narcotics, xanax, robaxin  -no fevers, mild leukocytosis resolved  2. Hyponatremia  -has chronic hyponatremia/SIADH  -Urine Na/Osm consistent with SIADH -stopped IVF, repeat Bmet in am, Fluid restriction  -cut down  Zoloft dose  -improving to 129 at discharge  3. H/o chronic back pain  -s/p laminectomy and fusion  -has chronic wound which is healing well  -continue wound care   4. CAD  -stable   5. COPD  -stable   6. H/o RA   7. Urinary retention initially -able to urinate now  - ambulate    Discharge Exam: Filed Vitals:   06/19/13 0440  BP: 134/78  Pulse: 65  Temp: 97.9 F (36.6 C)  Resp: 18    General: AAOx3 Cardiovascular: S1S2/RRR Respiratory: CTAB  Discharge Instructions  Discharge Orders   Future Appointments Provider Department Dept Phone   07/26/2013 3:30 PM Huston Foley, MD [facility] [phone]   07/30/2013 11:20 AM Ranelle Oyster, MD [facility] [phone]   08/10/2013 1:45 PM Tresa Garter, MD [facility] [phone]   Future Orders Complete By Expires   Diet - low sodium heart healthy  As directed    Increase activity slowly  As directed        Medication List    STOP taking these medications       ALPRAZolam 0.5 MG tablet  Commonly known as:  XANAX     FLUoxetine 40 MG capsule  Commonly known as:  PROZAC     guaiFENesin 600 MG 12 hr tablet  Commonly known as:  MUCINEX     HYDROmorphone 2 MG tablet  Commonly known as:  DILAUDID     methocarbamol 500 MG tablet  Commonly known as:  ROBAXIN      TAKE these medications       Aclidinium Bromide 400 MCG/ACT Aepb  Commonly known as:  TUDORZA PRESSAIR  Inhale 1 puff into the lungs 2 (two) times daily.     aspirin 81 MG EC tablet  Commonly known as:  aspirin EC  Take 1 tablet (81 mg total) by mouth every morning.     cholecalciferol 1000 UNITS tablet  Commonly known as:  VITAMIN D  Take 1 tablet (1,000 Units total) by mouth daily.     docusate sodium 100 MG capsule  Commonly known as:  COLACE  Take 1 capsule (100 mg total) by mouth 2 (two) times daily.     fluticasone 50 MCG/ACT nasal spray   Commonly known as:  FLONASE  Place 2 sprays into both nostrils at bedtime.     gabapentin 300 MG capsule  Commonly known as:  NEURONTIN  Take 2 capsules (600 mg total) by mouth 2 (two) times daily.     losartan 50 MG tablet  Commonly known as:  COZAAR  Take 1 tablet (50 mg total) by mouth daily.     metoprolol tartrate 25 MG tablet  Commonly known as:  LOPRESSOR  Take 1 tablet (25 mg total) by mouth 2 (two) times daily.     predniSONE 5 MG tablet  Commonly known as:  DELTASONE  Take 1 tablet (5 mg total) by mouth every evening.     simvastatin 40 MG tablet  Commonly known as:  ZOCOR  Take 1 tablet (40 mg total) by mouth daily with lunch.     SYSTANE ULTRA 0.4-0.3 % Soln  Generic drug:  Polyethyl Glycol-Propyl Glycol  Place 2 drops into both eyes 2 (two) times daily as needed (dry eyes).       Allergies  Allergen Reactions  . Morphine And Related Shortness Of Breath, Nausea And Vomiting, Swelling and Other (See Comments)    Agitation, tolerates dilaudid  . Penicillins Hives, Shortness Of Breath and Rash    REACTION: hives, breathing problems  . Cefixime     rash  . Percocet [Oxycodone-Acetaminophen] Anxiety    Takes OxyContin at home.       Follow-up Information   Follow up with Illene Regulus, MD. Schedule an appointment as soon as possible for a visit in 1 week.   Specialty:  Internal Medicine   Contact information:   [address] [place] [address] [phone]       Follow up with Blood work-Bmet  In 1 week. (through PCPs office)       Follow up with [facility] care. (Continue Dressing changes everyother day as before and re-eval per PCP in 1 week)        The results of significant diagnostics from this hospitalization (including imaging, microbiology, ancillary and laboratory) are listed below for reference.    Significant Diagnostic Studies: Dg Chest 2 View  06/30/2013   CLINICAL DATA:  Small probe, weakness and tiredness.  EXAM: CHEST  2 VIEW   COMPARISON:  06/16/2013  FINDINGS: There is perihilar and medial right lung base atelectasis. Lungs are otherwise clear.  Elevated right hemidiaphragm is stable.  No pleural effusion.  No pneumothorax.  Cardiac silhouette is normal in size. Normal mediastinal and hilar contours.  Bilateral shoulder prostheses are well aligned. There has been previous lumbar spine fusion surgery.  IMPRESSION: No acute cardiopulmonary disease.   Electronically Signed   By: Amie Portland M.D.   On: 06/30/2013 16:01  Ct Head Wo Contrast  06/30/2013   CLINICAL DATA:  HEADACHE FEVER  EXAM: CT HEAD WITHOUT CONTRAST  TECHNIQUE: Contiguous axial images were obtained from the base of the skull through the vertex without intravenous contrast.  COMPARISON:  CT HEAD W/O CM dated 06/16/2013  FINDINGS: No acute intracranial abnormality. Specifically, no hemorrhage, hydrocephalus, mass lesion, acute infarction, or significant intracranial injury. No acute calvarial abnormality. Stable areas of low attenuation within the subcortical, deep, and periventricular white matter regions. Stable global atrophy. The visualized paranasal sinuses and mastoid air cells are patent.  IMPRESSION: Involutional and chronic changes without evidence of focal or acute abnormalities.   Electronically Signed   By: Salome Holmes M.D.   On: 06/30/2013 20:40   Ct Head Wo Contrast  06/16/2013   CLINICAL DATA Confusion with headache.  EXAM CT HEAD WITHOUT CONTRAST  TECHNIQUE Contiguous axial images were obtained from the base of the skull through the vertex without contrast.  COMPARISON 05/06/2013.  FINDINGS Motion degraded exam.  Study was repeated.  Overall diagnostic.  Moderate atrophy and chronic microvascular ischemic change. No acute stroke, hemorrhage, mass lesion, hydrocephalus, or extraction fluid. Calvarium intact. No sinus or mastoid disease. Negative orbits. Stable appearance from priors.  IMPRESSION Chronic changes as described.  No acute intracranial  abnormality.  SIGNATURE  Electronically Signed   By: Davonna Belling M.D.   On: 06/16/2013 17:20   Ir Fluoro Guide Ndl Plmt / Bx  06/10/2013   CLINICAL DATA:  Postsurgical fluid collection in the lumbar region. Suspicious of infection.  EXAM: IR FLUORO GUIDE NEEDLE PLACEMENT /BIOPSY:  MEDICATIONS: Versed 1 mg IV. Fentanyl 50 mcg IV. Dilaudid 1 mg IV.  ANESTHESIA/SEDATION: Conscious sedation.  CONTRAST:  None  PROCEDURE: Following full explanation of the procedure along with the potential associated complications, an informed witnessed consent was obtained.  The patient was laid prone on the fluoroscopic table.  The skin overlying the lumbar region was then prepped and draped in the usual sterile fashion.  The skin entry site at the level of L4-L5 in the paraspinous region was then infiltrated with 0.25% bupivacaine, and carried to the deeper tissues.  Using biplane intermittent fluoroscopy, a 20 gauge, a 22 gauge and an 18 gauge, spine needle were then advanced in different locations projecting superiorly and inferiorly in the midline. Using a 20 mL syringe, approximately 12 cc of thick cloudy blood-stained fluid was obtained and sent for microbiologic pathologic analysis.  Hemostasis was then achieved at the skin entry sites. The patient tolerated the procedure well.  COMPLICATIONS: None immediate.  FINDINGS: As above.  IMPRESSION: Status post fluoroscopic guided needle aspiration as described above for paraspinous fluid collection.   Electronically Signed   By: Julieanne Cotton M.D.   On: 06/08/2013 15:39   Dg Chest Port 1 View  06/16/2013   CLINICAL DATA Altered mental status  EXAM PORTABLE CHEST - 1 VIEW  COMPARISON 06/01/2013  FINDINGS Mild cardiomegaly which is chronic. Long coronary artery stent noted. There is chronic elevation the right diaphragm. Streaky retrocardiac opacity which is stable from prior and most consistent with scarring. No definite consolidation, edema, effusion, or pneumothorax.  Chronic elevation the right diaphragm. Bilateral glenohumeral arthroplasty, total on the right. There is remodeling of the glenoid left.  IMPRESSION 1. No evidence of edema or pneumonia. 2. Stable, mild scarring at the left base.  SIGNATURE  Electronically Signed   By: Tiburcio Pea M.D.   On: 06/16/2013 18:19    Microbiology: No results found  for this or any previous visit (from the past 240 hour(s)).   Labs: Basic Metabolic Panel:  Recent Labs Lab 06/30/13 1425  NA 133*  K 4.4  CL 94*  CO2 23  GLUCOSE 120*  BUN 14  CREATININE 0.53  CALCIUM 9.1   Liver Function Tests: No results found for this basename: AST, ALT, ALKPHOS, BILITOT, PROT, ALBUMIN,  in the last 168 hours No results found for this basename: LIPASE, AMYLASE,  in the last 168 hours No results found for this basename: AMMONIA,  in the last 168 hours CBC:  Recent Labs Lab 06/30/13 1425  WBC 15.2*  NEUTROABS 11.8*  HGB 13.9  HCT 40.0  MCV 87.3  PLT 412*   Cardiac Enzymes: No results found for this basename: CKTOTAL, CKMB, CKMBINDEX, TROPONINI,  in the last 168 hours BNP: BNP (last 3 results)  Recent Labs  05/06/13 1321  PROBNP 306.1*   CBG: No results found for this basename: GLUCAP,  in the last 168 hours     Signed:  Digna Countess  [facility] 07/03/2013, 2:50 PM

## 2013-07-05 ENCOUNTER — Telehealth: Payer: Self-pay

## 2013-07-05 ENCOUNTER — Ambulatory Visit: Payer: Medicare Other | Admitting: Neurology

## 2013-07-05 DIAGNOSIS — G479 Sleep disorder, unspecified: Secondary | ICD-10-CM | POA: Insufficient documentation

## 2013-07-05 NOTE — Telephone Encounter (Addendum)
Amy @ [facility] is requesting a verbal order to eval. and treat patient with Speech Therapy. Is this okay?

## 2013-07-05 NOTE — Progress Notes (Signed)
Subjective:    Patient ID: [name], male    DOB: 02/28/1946, 68 y.o.   MRN: [id number]  HPI Mr. Mairena presents for general medical follow up, accompanied by his son and daughter-in-law. He has had a very difficult time with multiple medical problems following back surgery - all well documented in the hospital record and previous notes. Since his last hospital d/c he has been living with his son. By their report he is off narcotics!! He is still having quite a bit of pain but is managing. He has lost a lot of weight but is currently on a good diet plus calorie supplement. Since being home he is using a rolling walker and working on his recovery of strength and mobility.  He does report persistent Headache in a frontal-vertex/occipital distribution. On questioning his description favors crushing/pressure like HA. No associated neurologic symptoms.  Past Medical History  Diagnosis Date  . Hx of colonic polyps   . Diverticulosis   . Hypertension   . 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 CAD (coronary artery disease)     minimal coronary plaque in the LAD and right coronary system. 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 7-06. Last nuclear stress 11-17-06 fixed anterior/ inferior defect, no inducible ischemia, EF 81%  . Allergic rhinitis   . Hx of colonoscopy   . Dysrhythmia 01-24-12    past hx. A.Fib x1 episode-responded to med.  . Depression   . GERD (gastroesophageal reflux disease)   . COPD (chronic obstructive pulmonary disease)     pt denies this hx on 05/24/2013  . Osteoarthritis   . RA (rheumatoid arthritis)   . Chronic back pain     "all over back" (06/16/2013)  . PONV (postoperative nausea and vomiting)   . High cholesterol   . Sleep apnea     "never dx'd; snores bad" (06/16/2013)  . Daily headache   . Falls frequently     "since 02/2013" (06/16/2013)    . Anxiety   . Narcotic abuse     per family  . Alcoholism /alcohol abuse     per family  . Dementia    Past Surgical History  Procedure Laterality Date  . Appendectomy    . Total knee arthroplasty Right 2002     partial  . Shoulder hemi-arthroplasty Left 2008    partial  . Foot surgery Right 11-08    for removal of bone spurs-  . Ankle reconstruction Right 6-09    [facility]  . Posterior lumbar fusion  12-10    L4-5 diskectomy w/ fusion, cage placement and rods; [facility]  . Partial knee arthroplasty Left   . Total shoulder replacement Right   . Joint replacement    . Cataract extraction, bilateral Bilateral 01-24-12  . Orif shoulder fracture  02/06/2012    Procedure: OPEN REDUCTION INTERNAL FIXATION (ORIF) SHOULDER FRACTURE;  Surgeon: Nita Sells, MD;  Location: [facility];  Service: Orthopedics;  Laterality: Left;  ORIF of a Left Shoulder Fracture with  Iliac Crest Bone Graft aspiration   . Harvest bone graft  02/06/2012    Procedure: HARVEST ILIAC BONE GRAFT;  Surgeon: Nita Sells, MD;  Location: [facility];  Service: Orthopedics;;  bone marrow aspirqation   . Hardware removal  03/09/2012    Procedure: HARDWARE REMOVAL;  Surgeon: Nita Sells, MD;  Location: [facility]  [place];  Service: Orthopedics;  Laterality: Left;  Hardware Removal from Left Shoulder  . Shoulder arthroscopy Right 2013  . Carpal tunnel release Right 1990's  . Total ankle replacement Right 2008    at [facility]  . Hammer toe surgery Right 07/2012    "broke 4 hammertoes"   . Posterior lumbar fusion 4 level N/A 03/02/2013    Procedure: Lumbar One to Sacral One Posterior lumbar interbody fusion;  Surgeon: Floyce Stakes, MD;  Location: [place] [facility];  Service: Neurosurgery;  Laterality: N/A;  L1 to S1 Posterior lumbar interbody fusion  . Lumbar wound debridement N/A 03/17/2013    Procedure: Incision and drainage of superficial lumbar wound;  Surgeon: Floyce Stakes, MD;  Location: [place]  [facility];  Service: Neurosurgery;  Laterality: N/A;  Incision and drainage of superficial lumbar wound  . Tonsillectomy    . Coronary angioplasty with stent placement  2006    "while repairing 1st stent, a second area tore and they had to place 2nd stent " ?LAD & CX  . Inguinal hernia repair Bilateral    Family History  Problem Relation Age of Onset  . Uterine cancer Mother     survivor  . Macular degeneration Mother   . Other Mother     ankle edema  . Lung cancer Mother   . Cancer Mother     ovarian, lung  . Coronary artery disease Father   . Hypertension Father   . Prostate cancer Father   . Colon polyps Father   . Cancer Father     prostate  . Thyroid disease Sister   . Heart attack Brother   . Hyperlipidemia Brother   . Other Brother     Schizophrenic  . Diabetes Neg Hx   . Colon cancer Neg Hx   . Esophageal cancer Neg Hx   . Rectal cancer Neg Hx   . Stomach cancer Neg Hx   . Coronary artery disease Maternal Aunt   . Heart attack Maternal Aunt   . Rheum arthritis Sister   . Hemochromatosis Sister    History   Social History  . Marital Status: Divorced    Spouse Name: N/A    Number of Children: 2  . Years of Education: 16   Occupational History  . 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 Smoking status: Never Smoker   . Smokeless tobacco: Never Used  . Alcohol Use: 0.0 oz/week     Comment: 06/16/2013 "used to drink alot; was pretty bad about it; don't think he's had any alcohol in the past year"  . Drug Use: No  . Sexual Activity: Yes    Partners: Female   Other Topics Concern  . Not on file   Social History Narrative   [employer] [place]. Married 1972-50yr seperated/divorced. Engaged April '11. 1 son '74; step-daughter '70. 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 During '11 discovered an [employer]-  $500,000+ loss. Is handling this OK - will keep the business running.     Current Outpatient Prescriptions on File Prior to Visit  Medication Sig Dispense Refill  . Aclidinium Bromide (TUDORZA PRESSAIR) 400 MCG/ACT AEPB Inhale 1 puff into the lungs 2 (two) times daily.  1 each  11  . aspirin (ASPIRIN EC) 81 MG EC tablet Take 1 tablet (81 mg total)  by mouth every morning.  30 tablet  12  . cholecalciferol (VITAMIN D) 1000 UNITS tablet Take 1 tablet (1,000 Units total) by mouth daily.  100 tablet  3  . FLUoxetine (PROZAC) 20 MG capsule Take 40 mg by mouth daily.      . fluticasone (FLONASE) 50 MCG/ACT nasal spray Place 2 sprays into both nostrils at bedtime.       . gabapentin (NEURONTIN) 300 MG capsule Take 2 capsules (600 mg total) by mouth 2 (two) times daily.  60 capsule  1  . losartan (COZAAR) 50 MG tablet Take 1 tablet (50 mg total) by mouth daily.  30 tablet  1  . metoprolol tartrate (LOPRESSOR) 25 MG tablet Take 1 tablet (25 mg total) by mouth 2 (two) times daily.  60 tablet  1  . Polyethyl Glycol-Propyl Glycol (SYSTANE ULTRA) 0.4-0.3 % SOLN Place 2 drops into both eyes 2 (two) times daily as needed (dry eyes).       . predniSONE (DELTASONE) 5 MG tablet Take 1 tablet (5 mg total) by mouth every evening.  30 tablet  1  . promethazine (PHENERGAN) 25 MG tablet Take 1 tablet (25 mg total) by mouth every 6 (six) hours as needed for nausea or vomiting.  20 tablet  0  . simvastatin (ZOCOR) 40 MG tablet Take 1 tablet (40 mg total) by mouth daily with lunch.  30 tablet  1  . docusate sodium (COLACE) 100 MG capsule Take 1 capsule (100 mg total) by mouth 2 (two) times daily.  60 capsule  0   No current facility-administered medications on file prior to visit.      Review of Systems  Constitutional: Positive for fatigue and unexpected weight change. Negative for fever, chills, diaphoresis and appetite change.  Eyes: Negative.   Respiratory: Negative for cough, chest tightness, shortness of  breath and wheezing.   Cardiovascular: Negative.   Gastrointestinal: Negative.   Endocrine: Negative.   Genitourinary: Negative.   Musculoskeletal: Positive for arthralgias, back pain, gait problem and myalgias. Negative for joint swelling and neck stiffness.  Skin: Positive for pallor and wound. Negative for rash.       Back wound from surgery not examined but by report is doing well.   Neurological: Positive for weakness and headaches. Negative for dizziness, seizures, syncope and light-headedness.  Hematological: Negative.   Psychiatric/Behavioral: Positive for dysphoric mood and decreased concentration. Negative for hallucinations, behavioral problems and agitation. The patient is not nervous/anxious.        Objective:   Physical Exam Filed Vitals:   07/01/13 1452  BP: 144/86  Pulse: 117  Temp: 98.4 F (36.9 C)   Gen'l- in no acute distress; has visibly lost a lot of weight.  Wt Readings from Last 3 Encounters:  07/01/13 124 lb (56.246 kg)  07/01/13 124 lb 4 oz (56.359 kg)  06/18/13 110 lb 0.2 oz (49.9 kg)  baseline weight prior to surgery = 160 lbs.  HEENT- C&S clear Cor - 2+ radial, RRR Pul -  Normal respirations Neuro - alert and oriented, MS - able to stand w/o assistance, able to ambulate using rolling walker.        Assessment & Plan:  Irregular bowel habit - Bowel habit - nature's way is the best way, i.e. A bulk laxative, e.g. Metamucil, benefiber, etc. Probiotics are OK

## 2013-07-05 NOTE — Assessment & Plan Note (Signed)
Headache - sounds like a tension headache Plan Ok to use the aleve, up to a max dose of 2 tabs every 12 hours. Watch for GI effects  Referral to Dr. Michel Santee - we will take care of this.

## 2013-07-05 NOTE — Assessment & Plan Note (Signed)
At his son's home with supervision. He is using walker on a regular basis. Has done much better in regard to falls - few since hospital discharge, w/o injury.

## 2013-07-05 NOTE — Assessment & Plan Note (Signed)
Sleep - sleep hygiene below is key. Plan  Sleep is a learned or unlearned behavior. 5 principles of sleep hygiene - 1) regular hour to retire and rise 7days/wk 2) no stimulants - caffeine, chocolat, alcohol, 3) regular exercise  - every afternoon  4) sleep sanctuary - a space that is right light, temperature, sound level, good bed where all you do is sleep. 5) No extinction behaviors, e.g. Laying in bed awake doing anything but sleeping. This means if you have a bad night - no naps, etc  Herbal - melatonin, camomille tea, warm milk  Rx - trazodone 50 mg at bedtime is ok

## 2013-07-05 NOTE — Assessment & Plan Note (Signed)
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 Long term use of extended release pain medications. With back surgery and subsequent problems he had many episodes of encephalopathy, many falls.   Pain control - YEAAH to moving away from narcotics!!!!!!! You will be better without them even if you have pain.  Plan Remain of if possible  Alternative - accupuncture

## 2013-07-05 NOTE — Assessment & Plan Note (Signed)
Body mass index is 21.32 kg/(m^2).  Patient reports an OK appetite. His daughter - in - law is very encouraging. He is taking calorie/protein supplementatin.

## 2013-07-05 NOTE — Telephone Encounter (Signed)
yes

## 2013-07-06 NOTE — Telephone Encounter (Signed)
Attempted to contact Amy @ [facility]. Left a message on a verified voicemail giving her the verbal okay to eval and treat patient with ST per Dr. Naaman Plummer.

## 2013-07-16 ENCOUNTER — Ambulatory Visit: Payer: Medicare Other | Admitting: Neurology

## 2013-07-20 ENCOUNTER — Telehealth: Payer: Self-pay

## 2013-07-20 NOTE — Telephone Encounter (Signed)
Nick(PT @ [facility]) is requesting a verbal order to extend patient's PT for 2 x a week for 3 more weeks. Is this okay?  FYI:: Merrilee Seashore also wanted to report patient had a fall on 4/12. He did no sustain any injuries nor medical attention. Patient is okay.

## 2013-07-20 NOTE — Telephone Encounter (Signed)
PT extension is fine

## 2013-07-21 NOTE — Telephone Encounter (Signed)
Left a message on a verified voicemail to inform [name] @ [facility] per Dr. Naaman Plummer it is okay to extend patient's PT.

## 2013-07-23 ENCOUNTER — Telehealth: Payer: Self-pay | Admitting: *Deleted

## 2013-07-23 NOTE — Telephone Encounter (Signed)
[name] [facility] PT called to report multiple falls over past several weeks.  Care giver reports that pt is non compliant with assistive devices recommeneded by HHPT .  He has been observed walking away from his assistive devices and has been educated multiple times about this.  [name] feels that [name] may be demonstrating early signs of dementia.  He has recommended that he have 24 hour supervision because of these issues. [name] has an appt with you 07/30/13 (Friday)

## 2013-07-26 ENCOUNTER — Ambulatory Visit (INDEPENDENT_AMBULATORY_CARE_PROVIDER_SITE_OTHER): Payer: Medicare Other | Admitting: Neurology

## 2013-07-26 ENCOUNTER — Encounter: Payer: Self-pay | Admitting: Neurology

## 2013-07-26 VITALS — BP 122/82 | HR 60 | Ht 64.0 in | Wt 148.0 lb

## 2013-07-26 DIAGNOSIS — R279 Unspecified lack of coordination: Secondary | ICD-10-CM

## 2013-07-26 DIAGNOSIS — R498 Other voice and resonance disorders: Secondary | ICD-10-CM

## 2013-07-26 DIAGNOSIS — R490 Dysphonia: Secondary | ICD-10-CM

## 2013-07-26 DIAGNOSIS — R270 Ataxia, unspecified: Secondary | ICD-10-CM

## 2013-07-26 DIAGNOSIS — M069 Rheumatoid arthritis, unspecified: Secondary | ICD-10-CM

## 2013-07-26 DIAGNOSIS — R269 Unspecified abnormalities of gait and mobility: Secondary | ICD-10-CM

## 2013-07-26 DIAGNOSIS — R1310 Dysphagia, unspecified: Secondary | ICD-10-CM

## 2013-07-26 NOTE — Progress Notes (Signed)
Subjective:    Patient ID: [name] is a 68 y.o. male.  HPI    Interim history:   [name] is a very pleasant 68 year old right-handed gentleman with an underlying complex medical history of hypertension, osteoarthritis, diverticulosis, hemachromatosis, coronary artery disease, allergic rhinitis, rheumatoid arthritis, and COPD, who presents for followup consultation of his ataxia. He is accompanied by his son and daughter in-law today. I last saw him on 12/15/2012, at which time we talked about previous test results again. We talked about his hospitalization from April 2014. Overall he had improved at the time. Workup had shown increased CSF protein, increased IgG index, positive oligoclonal bands. This of course in the context of autoimmune disease, namely rheumatoid arthritis. EEG was negative. We looked for cancer with screening CT abdomen pelvis and chest x-ray. He has been on immune suppressant, Humira. Brain MRI did not show any demyelinating process or mass or enhancing lesions. I considered a second opinion at a larger [facility] and a repeat brain MRI. His last CTH was on 06/30/13: Involutional and chronic changes without evidence of focal or acute abnormalities.   Today, he reports, he has a hoarse and softer voice since January 2015, and is in [facility] and PT. He has some choking on food, especially in the evening. Per Naaman Plummer, he is in the process of being referred to [facility] for ST. He has more joint pain and he is off Humira x 6 months, due to his back infection. He is off of narcotic pain medications.   In the interim, on 03/02/2013 he had lower spine surgery with decompression of the thecal sac and facetectomy, L2-3 discectomy, removal of herniated disc, placement of pedicle screws L1-3 and S1, fusion from L1-S1 involving the L4-5 fusion; this was under Dr. Joya Salm. Postoperatively he developed hypotension. He was discharged on 03/09/2013 to a skilled nursing facility. On  03/16/2013 he developed fever and vomiting and was readmitted under neurosurgery. He was found to have hypovolemia, hyponatremia and elevation in BUN and creatinine. On 03/17/2013 he had revision of a lumbar wound. On 03/19/2013 he was seen by infectious disease for a lumbar wound infection and treatment thereof. He was discharged to home on 04/06/2013 after being treated for a wound infection with IV antibiotics. He presented on 04/13/2013 with fever and hypotension and was sent to the emergency room for further workup and treatment. ENT was consulted for hoarseness and GI was consulted for concern for small bowel obstruction which was treated conservatively. He was discharged on 04/24/2013. He has had multiple falls with and without injuries, most recently a laceration to his face on 07/01/2013. He has had [place] physical therapy and speech therapy. He lives with his son currently. He is supposed to have 24 hour supervision and has not been fully compliant with the use of assistive devices as recommended by home health PT.  He was admitted on 05/06/2013 for altered mental status, likely secondary to polypharmacy including opioid overdose, presenting with mental status changes, acute renal failure, acute respiratory failure with hypoxia. He was discharged on 05/09/2013. He was admitted on 05/23/2013 with generalized weakness, fever, nausea and vomiting. He was transferred to inpatient rehabilitation on 05/27/2013. He was discharged on 06/10/2013. He was admitted on 06/16/2013 for altered mental status. He was discharged on 06/22/2013. He presented to the emergency room on 06/30/2013 for fever. He presented to the emergency room on 07/01/2013 for a fall. I saw him on 10/02/2012 after a recent hospitalization.  I first  met him on 07/14/2012 at the request of Dr. Ronnald Ramp. He has an underlying complex medical history including rheumatoid arthritis, osteoarthritis, COPD, atrial fibrillation, anemia, hypertension, heart  disease, reported fairly sudden onset unsteadiness of his gait, vertigo, dizziness, blurry vision since early March 2014 there was no significant prodromal illness. His wife had noted some slurring of speech. Head CT on 06/22/2012 showed no acute abnormality. 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 He also reported some nausea for which meclizine and scopolamine patch helped. I arranged for a hospital admission from clinic because of concerns with cerebellar dysfunction including dysarthria, nystagmus, but also some alteration in his mental status. The patient was not fully oriented and was sluggish in his responses and seemed confused, talking out of context at times. He had mild dysmetria bilaterally.  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 His altered mental status improved and his exam was better and he benefited from physical therapy which he completed. I felt that he had a variety of differential diagnoses possible including cerebellitis, and cephalitis, metabolic-toxic or other immune-paraneoplastic causes of his presentation. I recommended paraneoplastic testing. EEG was normal, chest x-ray was negative, CT abdomen and pelvis were negative. Of note, he has been on immune suppression with Humira. MRI did not show any demyelinating lesions.  His paraneoplastic testing was negative in July and I explained that to them today. He reports having had foot surgery on the right some 7 weeks ago and is now off the boot for the past week. He feels stable and his wife agrees. He has not had any recent vertigo, no falls and his speech is stable. He swallows okay. He has been on Humira for the past 2 years, [place] injections every other week. while he was in the hospital,  he had been off of Humira and felt, his RA flared up. He is going to see his back doctor again.   His Past Medical History Is Significant For: Past Medical History  Diagnosis Date  . Hx of colonic polyps   . Diverticulosis   . Hypertension   . 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 CAD (coronary artery disease)     minimal coronary plaque in the LAD and right coronary system. 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 7-06. Last nuclear stress 11-17-06 fixed anterior/ inferior defect, no inducible ischemia, EF 81%  . Allergic rhinitis   . Hx of colonoscopy   . Dysrhythmia 01-24-12    past hx. A.Fib x1 episode-responded to med.  . Depression   . GERD (gastroesophageal reflux disease)   . COPD (chronic obstructive pulmonary disease)     pt denies this hx on 05/24/2013  . Osteoarthritis   . RA (rheumatoid arthritis)   . Chronic back pain     "all over back" (06/16/2013)  . PONV (postoperative nausea and vomiting)   . High cholesterol   . Sleep apnea     "never dx'd; snores bad" (06/16/2013)  . Daily headache   . Falls frequently     "since 02/2013" (06/16/2013)  . Anxiety   . Narcotic abuse     per family  .  Alcoholism /alcohol abuse     per family  . Dementia     His Past Surgical History Is Significant For: Past Surgical History  Procedure Laterality Date  . Appendectomy    . Total knee arthroplasty Right 2002     partial  . Shoulder hemi-arthroplasty Left 2008    partial  . Foot surgery Right 11-08    for removal of bone spurs-  . Ankle reconstruction Right 6-09    [facility]  . Posterior lumbar fusion  12-10    L4-5 diskectomy w/ fusion, cage placement and rods; [facility]  . Partial knee arthroplasty Left   . Total shoulder replacement Right   . Joint replacement    . Cataract extraction, bilateral Bilateral 01-24-12  . Orif shoulder fracture  02/06/2012     Procedure: OPEN REDUCTION INTERNAL FIXATION (ORIF) SHOULDER FRACTURE;  Surgeon: Nita Sells, MD;  Location: [facility];  Service: Orthopedics;  Laterality: Left;  ORIF of a Left Shoulder Fracture with  Iliac Crest Bone Graft aspiration   . Harvest bone graft  02/06/2012    Procedure: HARVEST ILIAC BONE GRAFT;  Surgeon: Nita Sells, MD;  Location: [facility];  Service: Orthopedics;;  bone marrow aspirqation   . Hardware removal  03/09/2012    Procedure: HARDWARE REMOVAL;  Surgeon: Nita Sells, MD;  Location: [place];  Service: Orthopedics;  Laterality: Left;  Hardware Removal from Left Shoulder  . Shoulder arthroscopy Right 2013  . Carpal tunnel release Right 1990's  . Total ankle replacement Right 2008    [facility]  . Hammer toe surgery Right 07/2012    "broke 4 hammertoes"   . Posterior lumbar fusion 4 level N/A 03/02/2013    Procedure: Lumbar One to Sacral One Posterior lumbar interbody fusion;  Surgeon: Floyce Stakes, MD;  Location: [place] [facility];  Service: Neurosurgery;  Laterality: N/A;  L1 to S1 Posterior lumbar interbody fusion  . Lumbar wound debridement N/A 03/17/2013    Procedure: Incision and drainage of superficial lumbar wound;  Surgeon: Floyce Stakes, MD;  Location: [place] [facility]URO ORS;  Service: Neurosurgery;  Laterality: N/A;  Incision and drainage of superficial lumbar wound  . Tonsillectomy    . Coronary angioplasty with stent placement  2006    "while repairing 1st stent, a second area tore and they had to place 2nd stent " ?LAD & CX  . Inguinal hernia repair Bilateral     His Family History Is Significant For: Family History  Problem Relation Age of Onset  . Uterine cancer Mother     survivor  . Macular degeneration Mother   . Other Mother     ankle edema  . Lung cancer Mother   . Cancer Mother     ovarian, lung  . Coronary artery disease Father   . Hypertension Father   . Prostate cancer Father   . Colon polyps Father    . Cancer Father     prostate  . Thyroid disease Sister   . Heart attack Brother   . Hyperlipidemia Brother   . Other Brother     Schizophrenic  . Diabetes Neg Hx   . Colon cancer Neg Hx   . Esophageal cancer Neg Hx   . Rectal cancer Neg Hx   . Stomach cancer Neg Hx   . Coronary artery disease Maternal Aunt   . Heart attack Maternal Aunt   . Rheum arthritis Sister   . Hemochromatosis Sister  His Social History Is Significant For: History   Social History  . Marital Status: Divorced    Spouse Name: N/A    Number of Children: 2  . Years of Education: 16   Occupational History  . 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 Smoking status: Never Smoker   . Smokeless tobacco: Never Used  . Alcohol Use: 0.0 oz/week     Comment: 06/16/2013 "used to drink alot; was pretty bad about it; don't think he's had any alcohol in the past year"  . Drug Use: No  . Sexual Activity: Yes    Partners: Female   Other Topics Concern  . None   Social History Narrative   [employer] - BA. Married 1972-89yr seperated/divorced. Engaged April '11. 1 son '74; step-daughter '70. 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 During '11 discovered an [employer]- $500,000+ loss. Is handling this OK - will keep the business running.    His Allergies Are:  Allergies  Allergen Reactions  . Morphine And Related Shortness Of Breath, Nausea And Vomiting, Swelling and Other (See Comments)    Agitation, tolerates dilaudid  . Penicillins Hives, Shortness Of Breath and Rash    REACTION: hives, breathing problems  . Cefixime     rash  . Percocet [Oxycodone-Acetaminophen] Anxiety    Takes OxyContin at home.  :   His Current Medications Are:  Outpatient Encounter Prescriptions as of 07/26/2013  Medication Sig  . Aclidinium Bromide (TUDORZA PRESSAIR) 400 MCG/ACT  AEPB Inhale 1 puff into the lungs 2 (two) times daily.  .Marland Kitchenaspirin (ASPIRIN EC) 81 MG EC tablet Take 1 tablet (81 mg total) by mouth every morning.  . cholecalciferol (VITAMIN D) 1000 UNITS tablet Take 1 tablet (1,000 Units total) by mouth daily.  . diphenhydrAMINE (BENADRYL) 25 MG tablet Take 25 mg by mouth daily.  .Marland KitchenFLUoxetine (PROZAC) 20 MG capsule Take 40 mg by mouth daily.  . fluticasone (FLONASE) 50 MCG/ACT nasal spray Place 2 sprays into both nostrils at bedtime.   . gabapentin (NEURONTIN) 300 MG capsule Take 2 capsules (600 mg total) by mouth 2 (two) times daily.  .Marland Kitchenlosartan (COZAAR) 50 MG tablet Take 1 tablet (50 mg total) by mouth daily.  . metoprolol tartrate (LOPRESSOR) 25 MG tablet Take 1 tablet (25 mg total) by mouth 2 (two) times daily.  . naproxen sodium (ANAPROX) 220 MG tablet Take 220 mg by mouth 2 (two) times daily with a meal.  . pantoprazole (PROTONIX) 20 MG tablet Take 40 mg by mouth 2 (two) times daily.  . polycarbophil (FIBERCON) 625 MG tablet Take 625 mg by mouth daily.  . predniSONE (DELTASONE) 5 MG tablet Take 1 tablet (5 mg total) by mouth every evening.  . Probiotic Product (PROBIOTIC DAILY PO) Take 1 application by mouth 3 (three) times daily.  . simvastatin (ZOCOR) 40 MG tablet Take 1 tablet (40 mg total) by mouth daily with lunch.  . traZODone (DESYREL) 100 MG tablet Take 1 tablet by mouth at bedtime as needed.  . docusate sodium (COLACE) 100 MG capsule Take 1 capsule (100 mg total) by mouth 2 (two) times daily.  .Vladimir FasterGlycol-Propyl Glycol (SYSTANE ULTRA) 0.4-0.3 % SOLN Place 2 drops into both eyes 2 (two) times daily as needed (dry eyes).   . promethazine (PHENERGAN) 25 MG tablet Take 1 tablet (25 mg  total) by mouth every 6 (six) hours as needed for nausea or vomiting.  :  Review of Systems:  Out of a complete 14 point review of systems, all are reviewed and negative with the exception of these symptoms as listed below:  Review of Systems  Constitutional:  Positive for activity change and unexpected weight change.  Eyes: Negative.   Respiratory: Positive for cough.   Cardiovascular: Negative.   Gastrointestinal: Negative.   Endocrine: Positive for polyphagia.  Genitourinary: Negative.   Musculoskeletal: Positive for arthralgias, back pain, gait problem and joint swelling.  Skin: Negative.   Allergic/Immunologic: Positive for environmental allergies.  Neurological: Positive for dizziness, speech difficulty, weakness and headaches.       Memory loss   Hematological: Negative.   Psychiatric/Behavioral: Positive for sleep disturbance (insomnia).    Objective:  Neurologic Exam  Physical Exam Physical Examination:   Filed Vitals:   07/26/13 1512  BP: 122/82  Pulse: 60   General Examination: The patient is a very pleasant 68 y.o. male in no acute distress.  HEENT: Normocephalic, atraumatic, pupils are equal, round and reactive to light and accommodation. Funduscopic exam is normal with sharp disc margins noted. He is status post bilateral cataract repairs. Extraocular tracking is fair and actually improved from last time with no nystagmus seen. Hearing is grossly intact. Face is symmetric with normal facial animation and normal facial sensation. Speech is mildly dysarthric, but primarily hypophonic and mildly coarse. His speech is not consistent with spasmodic dysphonia. There is no lip, neck or jaw tremor. Neck is supple with full range of motion. There are no carotid bruits on auscultation. Oropharynx exam reveals moderate mouth dryness. No significant airway crowding is noted. Mallampati is class II. Tongue protrudes centrally and palate elevates symmetrically. Tongue movements are fine.   Chest: is clear to auscultation without wheezing, rhonchi or crackles noted.  Heart: sounds are regular and normal without murmurs, rubs or gallops noted.   Abdomen: is soft, non-tender and non-distended with normal bowel sounds appreciated on  auscultation.  Extremities: There is 1+ pitting edema in the distal lower extremities bilaterally, mostly around the ankles, right more so than left and ankle joint is swollen on the right. Pedal pulses are intact.  Skin: is warm and dry with no trophic changes noted.  Musculoskeletal: exam reveals R hip pain and decreasing range of motion in both shoulders, left worse than right.    Neurologically:  Mental status: The patient is awake, paying attention. He is fully oriented, and able to give a fairly good history. His memory, attention, language and knowledge are fairly well preserved, but he needs help with chronological relationship history by his family. He does seem to have some mild word finding difficulties.    Cranial nerves are as described above under HEENT exam. Motor exam: Normal bulk, and strength is globally 4+/5. Tone seems normal. There is no drift, or tremor, no rebound. Reflexes are 1+ throughout. Fine motor skills are mildly impaired, bilaterally. On finger to nose testing he has very mild dysmetria bilaterally, right more than left with left almost normal. He has no significant intention tremor. There is no truncal ataxia.  Sensory exam is intact to light touch. Gait, station and balance: He stands up with mild difficulty and only requires one attempt. His stance is mildly wide-based.  He walks with a 2 wheeled walker and slightly wide-based. He does maneuver the walker well. He turns in 3 steps. He is unable to do tandem walk.  Assessment and Plan:   In summary, JONAN SEUFERT is a very pleasant 68 year old male with a complex medical history, who presents  for followup consultation of his ataxia. We did workup for this last year in April which showed CSF increase protein, increased IgG index and positive OCB.  He had altered mental status which overall improved. Unfortunately he had significant issues in the interim with complications from lower back surgery including wound  infection and a protracted course clinically. At this moment, I would recommend that he continue with therapy, especially physical and speech therapy. He has had new hypophonia and hoarseness since January of this year and I suggested we do a modified barium swallow test and refer him back to ENT. He would like to see Dr. Redmond Baseman. I made a referral in that regard and also ordered a swallow study. DDx include, cerebellitis, meningitis/encephalitis, metabolic/toxic, autoimmune/paraneoplastic. Recent paraneoplastic testing was negative, EEG normal, negative chest x-ray and negative CT abdomen and pelvis for any mass. He has been on Humira, which was stopped several months ago because of his infection. His brain MRI did not show any demyelinating process or mass or enhancing lesion. I will follow him clinically in 6 months, sooner if need be. They were in agreement.

## 2013-07-26 NOTE — Patient Instructions (Addendum)
I will refer you back to ENT. Continue therapy and use your walker. I will order a swallowing test and speech pathology evaluation.

## 2013-07-30 ENCOUNTER — Encounter: Payer: Medicare Other | Attending: Physical Medicine & Rehabilitation | Admitting: Physical Medicine & Rehabilitation

## 2013-07-30 ENCOUNTER — Encounter: Payer: Self-pay | Admitting: Physical Medicine & Rehabilitation

## 2013-07-30 VITALS — BP 146/79 | HR 64 | Resp 14 | Ht 66.0 in | Wt 147.0 lb

## 2013-07-30 DIAGNOSIS — K219 Gastro-esophageal reflux disease without esophagitis: Secondary | ICD-10-CM | POA: Insufficient documentation

## 2013-07-30 DIAGNOSIS — I10 Essential (primary) hypertension: Secondary | ICD-10-CM | POA: Insufficient documentation

## 2013-07-30 DIAGNOSIS — M5137 Other intervertebral disc degeneration, lumbosacral region: Secondary | ICD-10-CM

## 2013-07-30 DIAGNOSIS — M961 Postlaminectomy syndrome, not elsewhere classified: Secondary | ICD-10-CM | POA: Insufficient documentation

## 2013-07-30 DIAGNOSIS — F329 Major depressive disorder, single episode, unspecified: Secondary | ICD-10-CM | POA: Insufficient documentation

## 2013-07-30 DIAGNOSIS — I2510 Atherosclerotic heart disease of native coronary artery without angina pectoris: Secondary | ICD-10-CM | POA: Insufficient documentation

## 2013-07-30 DIAGNOSIS — Z981 Arthrodesis status: Secondary | ICD-10-CM | POA: Insufficient documentation

## 2013-07-30 DIAGNOSIS — Z9181 History of falling: Secondary | ICD-10-CM | POA: Insufficient documentation

## 2013-07-30 DIAGNOSIS — R209 Unspecified disturbances of skin sensation: Secondary | ICD-10-CM | POA: Insufficient documentation

## 2013-07-30 DIAGNOSIS — F3289 Other specified depressive episodes: Secondary | ICD-10-CM | POA: Insufficient documentation

## 2013-07-30 DIAGNOSIS — G44209 Tension-type headache, unspecified, not intractable: Secondary | ICD-10-CM

## 2013-07-30 DIAGNOSIS — J4489 Other specified chronic obstructive pulmonary disease: Secondary | ICD-10-CM | POA: Insufficient documentation

## 2013-07-30 DIAGNOSIS — Z96669 Presence of unspecified artificial ankle joint: Secondary | ICD-10-CM | POA: Insufficient documentation

## 2013-07-30 DIAGNOSIS — F0390 Unspecified dementia without behavioral disturbance: Secondary | ICD-10-CM | POA: Insufficient documentation

## 2013-07-30 DIAGNOSIS — E236 Other disorders of pituitary gland: Secondary | ICD-10-CM | POA: Insufficient documentation

## 2013-07-30 DIAGNOSIS — M069 Rheumatoid arthritis, unspecified: Secondary | ICD-10-CM

## 2013-07-30 DIAGNOSIS — Z9089 Acquired absence of other organs: Secondary | ICD-10-CM | POA: Insufficient documentation

## 2013-07-30 DIAGNOSIS — M479 Spondylosis, unspecified: Secondary | ICD-10-CM

## 2013-07-30 DIAGNOSIS — Z96619 Presence of unspecified artificial shoulder joint: Secondary | ICD-10-CM | POA: Insufficient documentation

## 2013-07-30 DIAGNOSIS — J449 Chronic obstructive pulmonary disease, unspecified: Secondary | ICD-10-CM | POA: Insufficient documentation

## 2013-07-30 NOTE — Progress Notes (Signed)
Subjective:    Patient ID: [name], male    DOB: [birth year], 68 y.o.   MRN: [id number]  HPI  [name] is back regarding his back/lumbar fusion and laminectomy. He has had falls at home since he left rehab, but he and son feel that over the last week he has shown improvement. They feel it was related to his narcotics which "have cleared out of his system."   His mentation has improved. His appetite is good. His bowels and bladder are working normally. His therapist had contacted me about safety at home, but matters have improved apparently after coming off the narcotics.  Pain levels are controlled except for his hands (RA)--he would like to restart his humira.  He remains on the neurontin. He is still having some numbness in his legs, but there is little neuropathic pain at this point.  Overall he still feels weak and wants to get stronger. He is using a walker for gait at home and with therapy.   Pain Inventory Average Pain 2 Pain Right Now 3 My pain is intermittent and aching  In the last 24 hours, has pain interfered with the following? General activity 3 Relation with others 3 Enjoyment of life 7 What TIME of day is your pain at its worst? evening, night Sleep (in general) Fair  Pain is worse with: inactivity and some activites Pain improves with: rest and therapy/exercise Relief from Meds: 4  Mobility walk with assistance use a walker how many minutes can you walk? 15 transfers alone Do you have any goals in this area?  yes  Function employed # of hrs/week 40  Neuro/Psych weakness  Prior Studies Any changes since last visit?  no  Physicians involved in your care Any changes since last visit?  no   Family History  Problem Relation Age of Onset  . Uterine cancer Mother     survivor  . Macular degeneration Mother   . Other Mother     ankle edema  . Lung cancer Mother   . Cancer Mother     ovarian, lung  . Coronary artery disease Father    . Hypertension Father   . Prostate cancer Father   . Colon polyps Father   . Cancer Father     prostate  . Thyroid disease Sister   . Heart attack Brother   . Hyperlipidemia Brother   . Other Brother     Schizophrenic  . Diabetes Neg Hx   . Colon cancer Neg Hx   . Esophageal cancer Neg Hx   . Rectal cancer Neg Hx   . Stomach cancer Neg Hx   . Coronary artery disease Maternal Aunt   . Heart attack Maternal Aunt   . Rheum arthritis Sister   . Hemochromatosis Sister    History   Social History  . Marital Status: Divorced    Spouse Name: N/A    Number of Children: 2  . Years of Education: 16   Occupational History  . 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 Smoking status: Never Smoker   . Smokeless tobacco: Never Used  . Alcohol Use: 0.0 oz/week     Comment: 06/16/2013 "used to drink alot; was pretty bad about it; don't think he's had any alcohol in the past year"  . Drug Use: No  . Sexual Activity: Yes    Partners: Female   Other Topics Concern  . None  Social History Narrative   [employer] - [employer]. Married 1972-35yr seperated/divorced. Engaged April '11. 1 son '74; step-daughter '70. 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 During '11 discovered an [employer]- $500,000+ loss. Is handling this OK - will keep the business running.   Past Surgical History  Procedure Laterality Date  . Appendectomy    . Total knee arthroplasty Right 2002     partial  . Shoulder hemi-arthroplasty Left 2008    partial  . Foot surgery Right 11-08    for removal of bone spurs-  . Ankle reconstruction Right 6-09    [facility]  . Posterior lumbar fusion  12-10    L4-5 diskectomy w/ fusion, cage placement and rods; [facility]  . Partial knee arthroplasty Left   . Total shoulder replacement Right   . Joint replacement    . Cataract extraction,  bilateral Bilateral 01-24-12  . Orif shoulder fracture  02/06/2012    Procedure: OPEN REDUCTION INTERNAL FIXATION (ORIF) SHOULDER FRACTURE;  Surgeon: JNita Sells MD;  Location: [facility];  Service: Orthopedics;  Laterality: Left;  ORIF of a Left Shoulder Fracture with  Iliac Crest Bone Graft aspiration   . Harvest bone graft  02/06/2012    Procedure: HARVEST ILIAC BONE GRAFT;  Surgeon: JNita Sells MD;  Location: [facility];  Service: Orthopedics;;  bone marrow aspirqation   . Hardware removal  03/09/2012    Procedure: HARDWARE REMOVAL;  Surgeon: JNita Sells MD;  [facility] [facility]  Service: Orthopedics;  Laterality: Left;  Hardware Removal from Left Shoulder  . Shoulder arthroscopy Right 2013  . Carpal tunnel release Right 1990's  . [facility] . Hammer toe surgery Right 07/2012    "broke 4 hammertoes"   . Posterior lumbar fusion 4 level N/A 03/02/2013    Procedure: Lumbar One to Sacral One Posterior lumbar interbody fusion;  Surgeon: EFloyce Stakes MD;  [facility];  Service: Neurosurgery;  Laterality: N/A;  L1 to S1 Posterior lumbar interbody fusion  . Lumbar wound debridement N/A 03/17/2013    Procedure: Incision and drainage of superficial lumbar wound;  Surgeon: EFloyce Stakes MD;  [facility];  Service: Neurosurgery;  Laterality: N/A;  Incision and drainage of superficial lumbar wound  . Tonsillectomy    . Coronary angioplasty with stent placement  2006    "while repairing 1st stent, a second area tore and they had to place 2nd stent " ?LAD & CX  . Inguinal hernia repair Bilateral    Past Medical History  Diagnosis Date  . Hx of colonic polyps   . Diverticulosis   . Hypertension   . 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 CAD (coronary artery disease)     minimal coronary plaque in the LAD and right coronary system. PCI of a  95% obtuse marginal lesion w/ resultant spiral dissection requiring drug-eluting stent placement. 7-06. Last nuclear stress 11-17-06 fixed anterior/ inferior defect, no inducible ischemia, EF 81%  . Allergic rhinitis   . Hx of colonoscopy   . Dysrhythmia 01-24-12    past hx. A.Fib x1 episode-responded to med.  . Depression   . GERD (gastroesophageal reflux disease)   . COPD (chronic obstructive pulmonary disease)     pt denies  this hx on 05/24/2013  . Osteoarthritis   . RA (rheumatoid arthritis)   . Chronic back pain     "all over back" (06/16/2013)  . PONV (postoperative nausea and vomiting)   . High cholesterol   . Sleep apnea     "never dx'd; snores bad" (06/16/2013)  . Daily headache   . Falls frequently     "since 02/2013" (06/16/2013)  . Anxiety   . Narcotic abuse     per family  . Alcoholism /alcohol abuse     per family  . Dementia    BP 146/79  Pulse 64  Resp 14  Ht 5' 6"  (1.676 m)  Wt 147 lb (66.679 kg)  BMI 23.74 kg/m2  SpO2 99%  Opioid Risk Score:   Fall Risk Score: High Fall Risk (>13 points) (pt educated and given a brochure on fall risk)   Review of Systems  Constitutional: Positive for unexpected weight change.  Neurological: Positive for weakness.  All other systems reviewed and are negative.      Objective:   Physical Exam  HENT: oral mucosa pink and moist, voice dysphonic  Head: Normocephalic.  Eyes: EOM are normal.  Neck: Normal range of motion. Neck supple. No thyromegaly present. Voice is still hoarse. Cardiovascular: Normal rate and regular rhythm.  Respiratory: Effort normal and breath sounds normal. No respiratory distress. Cough with upper airway sounds. No rales.  GI: Soft. Bowel sounds are normal. He exhibits minimal distension. Soft. Non tender.   Skin: still dipahoretic especially on his back. Back incision is healed.  Neuro:  Alert and oriented. Cognition is appropriate. Strength is 4 to 4+/5 in all 4 limbs. Decreased LT over the  left greater than right leg. DTR's 1+. Gait is notable for posterior lean and impulsivity. Had to cue him to slow down on numerous occasions. Musc: RA deformities in the hands.  Psych: pleasant and appropriate    Assessment/Plan:  1.Lumbar postlaminectomy syndrome with postoperative infection and deconditioning. Patient with history of multiple back surgeries   -discussed balance/posture/safety. HE NEEDS TO SLOW DOWN!!!!  -I think he's ok to be alone at home  -encourage HEP, [employer], aquatic activities 2. Pain Management: Neurontin 600 mg twice a day, wean to 325m BID to see if this helps his balance further. He may be able to come off it entirely. 3. Mood/depression. Prozac 40 mg twice a day,  4. Rheumatoid arthritis. Chronic prednisone therapy. Recommend resuming humira per Rheum recs. 5. COPD. Spiriva daily, albuterol nebulizer every 2 hours as needed. Stable 6. Hx of SIADH 7. Voice: ENT follow up pending  Follow up with me PRN. 30 minutes of face to face patient care time were spent during this visit. All questions were encouraged and answered.  Overall he's made very nice progress!!

## 2013-07-30 NOTE — Patient Instructions (Signed)
GABAPENTIN: TRY DECREASING TO 600MG AT NIGHT AND 300MG IN THE MORNING FOR ONE WEEK THEN DECREASE TO 300MG TWICE DAILY THEREAFTER.

## 2013-08-03 ENCOUNTER — Other Ambulatory Visit: Payer: Self-pay | Admitting: *Deleted

## 2013-08-03 MED ORDER — HYDROCHLOROTHIAZIDE 25 MG PO TABS: ORAL_TABLET | ORAL | Status: DC

## 2013-08-06 ENCOUNTER — Other Ambulatory Visit (HOSPITAL_COMMUNITY): Payer: Self-pay | Admitting: Neurology

## 2013-08-06 DIAGNOSIS — R1310 Dysphagia, unspecified: Secondary | ICD-10-CM

## 2013-08-10 ENCOUNTER — Ambulatory Visit (INDEPENDENT_AMBULATORY_CARE_PROVIDER_SITE_OTHER): Payer: Medicare Other | Admitting: Internal Medicine

## 2013-08-10 ENCOUNTER — Encounter: Payer: Self-pay | Admitting: Internal Medicine

## 2013-08-10 ENCOUNTER — Ambulatory Visit (INDEPENDENT_AMBULATORY_CARE_PROVIDER_SITE_OTHER): Payer: Medicare Other

## 2013-08-10 VITALS — BP 140/80 | HR 72 | Temp 96.9°F | Resp 16 | Wt 145.0 lb

## 2013-08-10 DIAGNOSIS — M069 Rheumatoid arthritis, unspecified: Secondary | ICD-10-CM

## 2013-08-10 DIAGNOSIS — I4891 Unspecified atrial fibrillation: Secondary | ICD-10-CM

## 2013-08-10 DIAGNOSIS — E785 Hyperlipidemia, unspecified: Secondary | ICD-10-CM

## 2013-08-10 DIAGNOSIS — D649 Anemia, unspecified: Secondary | ICD-10-CM

## 2013-08-10 DIAGNOSIS — I10 Essential (primary) hypertension: Secondary | ICD-10-CM

## 2013-08-10 DIAGNOSIS — I2510 Atherosclerotic heart disease of native coronary artery without angina pectoris: Secondary | ICD-10-CM

## 2013-08-10 DIAGNOSIS — R490 Dysphonia: Secondary | ICD-10-CM | POA: Insufficient documentation

## 2013-08-10 DIAGNOSIS — R634 Abnormal weight loss: Secondary | ICD-10-CM

## 2013-08-10 LAB — BASIC METABOLIC PANEL
BUN: 16 mg/dL (ref 6–23)
CO2: 31 mEq/L (ref 19–32)
Calcium: 9.8 mg/dL (ref 8.4–10.5)
Chloride: 101 mEq/L (ref 96–112)
Creatinine, Ser: 0.7 mg/dL (ref 0.4–1.5)
GFR: 126.02 mL/min (ref >60.00)
Glucose, Bld: 68 mg/dL — ABNORMAL LOW (ref 70–99)
Potassium: 4.8 mEq/L (ref 3.5–5.1)
Sodium: 138 mEq/L (ref 135–145)

## 2013-08-10 LAB — CBC WITH DIFFERENTIAL/PLATELET
Basophils Absolute: 0 10*3/uL (ref 0.0–0.1)
Basophils Relative: 0.3 % (ref 0.0–3.0)
Eosinophils Absolute: 0.1 10*3/uL (ref 0.0–0.7)
Eosinophils Relative: 0.7 % (ref 0.0–5.0)
HCT: 43.6 % (ref 39.0–52.0)
Hemoglobin: 14.8 g/dL (ref 13.0–17.0)
Lymphocytes Relative: 33.6 % (ref 12.0–46.0)
Lymphs Abs: 3.7 10*3/uL (ref 0.7–4.0)
MCHC: 34 g/dL (ref 30.0–36.0)
MCV: 95.1 fl (ref 78.0–100.0)
Monocytes Absolute: 1.2 10*3/uL — ABNORMAL HIGH (ref 0.1–1.0)
Monocytes Relative: 10.4 % (ref 3.0–12.0)
Neutro Abs: 6.1 10*3/uL (ref 1.4–7.7)
Neutrophils Relative %: 55 % (ref 43.0–77.0)
Platelets: 307 10*3/uL (ref 150.0–400.0)
RBC: 4.59 Mil/uL (ref 4.22–5.81)
RDW: 17.1 % — ABNORMAL HIGH (ref 11.5–15.5)
WBC: 11.1 10*3/uL — ABNORMAL HIGH (ref 4.0–10.5)

## 2013-08-10 LAB — HEPATIC FUNCTION PANEL
ALT: 22 U/L (ref 0–53)
AST: 18 U/L (ref 0–37)
Albumin: 4.2 g/dL (ref 3.5–5.2)
Alkaline Phosphatase: 75 U/L (ref 39–117)
Bilirubin, Direct: 0.1 mg/dL (ref 0.0–0.3)
Total Bilirubin: 0.7 mg/dL (ref 0.2–1.2)
Total Protein: 7 g/dL (ref 6.0–8.3)

## 2013-08-10 LAB — VITAMIN B12: Vitamin B-12: 536 pg/mL (ref 211–911)

## 2013-08-10 LAB — T4, FREE: Free T4: 1.55 ng/dL (ref 0.60–1.60)

## 2013-08-10 LAB — CK: Total CK: 55 U/L (ref 7–232)

## 2013-08-10 LAB — MAGNESIUM: Magnesium: 1.9 mg/dL (ref 1.5–2.5)

## 2013-08-10 MED ORDER — SIMVASTATIN 20 MG PO TABS: 20.0000 mg | ORAL_TABLET | Freq: Every day | ORAL | Status: DC

## 2013-08-10 NOTE — Assessment & Plan Note (Signed)
2015 post-surgery/intubation

## 2013-08-10 NOTE — Assessment & Plan Note (Signed)
Wt Readings from Last 3 Encounters:  08/10/13 145 lb (65.772 kg)  07/30/13 147 lb (66.679 kg)  07/26/13 148 lb (67.132 kg)

## 2013-08-10 NOTE — Assessment & Plan Note (Signed)
Continue with current prescription therapy as reflected on the Med list.  

## 2013-08-10 NOTE — Assessment & Plan Note (Signed)
Will decrease Simvastatin

## 2013-08-10 NOTE — Progress Notes (Signed)
Subjective:    HPI  Switching from Dr Linda Hedges  - needs new PCP  Wt Readings from Last 3 Encounters:  08/10/13 145 lb (65.772 kg)  07/30/13 147 lb (66.679 kg)  07/26/13 148 lb (67.132 kg)     BP Readings from Last 3 Encounters:  08/10/13 140/80  07/30/13 146/79  07/26/13 122/82   S/p recent LS surgery, sepsis, rehab - weak muscles....  [name] presents for general medical follow up, accompanied by his son and daughter-in-law. He has had a very difficult time with multiple medical problems following back surgery - all well documented in the hospital record and previous notes. Since his last hospital d/c he has been living with his son. By their report he is off narcotics!! He is still having quite a bit of pain but is managing. He has lost a lot of weight but is currently on a good diet plus calorie supplement. Since being home he is using a rolling walker and working on his recovery of strength and mobility.  He does report persistent Headache in a frontal-vertex/occipital distribution. On questioning his description favors crushing/pressure like HA. No associated neurologic symptoms.  Past Medical History   Diagnosis  Date   .  Hx of colonic polyps    .  Diverticulosis    .  Hypertension    .  Hemochromatosis      dx'd 14 yrs ago last ferritin Aug 11, 08 52 (22-322), Fe 136   .  CAD (coronary artery disease)      minimal coronary plaque in the LAD and right coronary system. 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 7-06. Last nuclear stress 11-17-06 fixed anterior/ inferior defect, no inducible ischemia, EF 81%   .  Allergic rhinitis    .  Hx of colonoscopy    .  Dysrhythmia  01-24-12     past hx. A.Fib x1 episode-responded to med.   .  Depression    .  GERD (gastroesophageal reflux disease)    .  COPD (chronic obstructive pulmonary disease)      pt denies this hx on 05/24/2013   .  Osteoarthritis    .  RA  (rheumatoid arthritis)    .  Chronic back pain      "all over back" (06/16/2013)   .  PONV (postoperative nausea and vomiting)    .  High cholesterol    .  Sleep apnea      "never dx'd; snores bad" (06/16/2013)   .  Daily headache    .  Falls frequently      "since 02/2013" (06/16/2013)   .  Anxiety    .  Narcotic abuse      per family   .  Alcoholism /alcohol abuse      per family   .  Dementia     Past Surgical History   Procedure  Laterality  Date   .  Appendectomy     .  Total knee arthroplasty  Right  2002     partial   .  Shoulder hemi-arthroplasty  Left  2008     partial   .  Foot surgery  Right  11-08     for removal of bone spurs-   .  Ankle reconstruction  Right  6-09     [facility]   .  Posterior lumbar fusion   12-10     L4-5 diskectomy w/ fusion, cage  placement and rods; [facility]   .  Partial knee arthroplasty  Left    .  Total shoulder replacement  Right    .  Joint replacement     .  Cataract extraction, bilateral  Bilateral  01-24-12   .  Orif shoulder fracture   02/06/2012     Procedure: OPEN REDUCTION INTERNAL FIXATION (ORIF) SHOULDER FRACTURE; Surgeon: Nita Sells, MD; [facility]; Service: Orthopedics; Laterality: Left; ORIF of a Left Shoulder Fracture with Iliac Crest Bone Graft aspiration   .  Harvest bone graft   02/06/2012     Procedure: HARVEST ILIAC BONE GRAFT; Surgeon: Nita Sells, MD; [facility]; Service: Orthopedics;; bone marrow aspirqation   .  Hardware removal   03/09/2012     Procedure: HARDWARE REMOVAL; Surgeon: Nita Sells, MD; Location: [place]; Service: Orthopedics; Laterality: Left; Hardware Removal from Left Shoulder   .  Shoulder arthroscopy  Right  2013   .  Carpal tunnel release  Right  1990's   .  Total ankle replacement  Right  2008     at [facility]   .  Hammer toe surgery  Right  07/2012     "broke 4 hammertoes"   .  Posterior lumbar fusion 4 level  N/A  03/02/2013     Procedure:  Lumbar One to Sacral One Posterior lumbar interbody fusion; Surgeon: Floyce Stakes, MD; [facility]: [place] [facility]; Service: Neurosurgery; Laterality: N/A; L1 to S1 Posterior lumbar interbody fusion   .  Lumbar wound debridement  N/A  03/17/2013     Procedure: Incision and drainage of superficial lumbar wound; Surgeon: Floyce Stakes, MD; Location: [place] [facility]; Service: Neurosurgery; Laterality: N/A; Incision and drainage of superficial lumbar wound   .  Tonsillectomy     .  Coronary angioplasty with stent placement   2006     "while repairing 1st stent, a second area tore and they had to place 2nd stent " ?LAD & CX   .  Inguinal hernia repair  Bilateral     Family History   Problem  Relation  Age of Onset   .  Uterine cancer  Mother      survivor   .  Macular degeneration  Mother    .  Other  Mother      ankle edema   .  Lung cancer  Mother    .  Cancer  Mother      ovarian, lung   .  Coronary artery disease  Father    .  Hypertension  Father    .  Prostate cancer  Father    .  Colon polyps  Father    .  Cancer  Father      prostate   .  Thyroid disease  Sister    .  Heart attack  Brother    .  Hyperlipidemia  Brother    .  Other  Brother      Schizophrenic   .  Diabetes  Neg Hx    .  Colon cancer  Neg Hx    .  Esophageal cancer  Neg Hx    .  Rectal cancer  Neg Hx    .  Stomach cancer  Neg Hx    .  Coronary artery disease  Maternal Aunt    .  Heart attack  Maternal Aunt    .  Rheum arthritis  Sister    .  Hemochromatosis  Sister     History    Social History   .  Marital Status:  Divorced     Spouse Name:  N/A     Number of Children:  2   .  Years of Education:  16    Occupational History   .  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  Smoking status:  Never Smoker   .  Smokeless tobacco:  Never Used   .  Alcohol Use:  0.0 oz/week      Comment: 06/16/2013 "used to drink alot; was pretty bad about it; don't think he's had any alcohol in  the past year"   .  Drug Use:  No   .  Sexual Activity:  Yes     Partners:  Female    Other Topics  Concern   .  Not on file    Social History Narrative    [employer] [place]. Married 1972-56yr seperated/divorced. Engaged April '11. 1 son '74; step-daughter '70. 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 During '11 discovered an [employer]- $500,000+ loss. Is handling this OK - will keep the business running.    Current Outpatient Prescriptions on File Prior to Visit   Medication  Sig  Dispense  Refill   .  Aclidinium Bromide (TUDORZA PRESSAIR) 400 MCG/ACT AEPB  Inhale 1 puff into the lungs 2 (two) times daily.  1 each  11   .  aspirin (ASPIRIN EC) 81 MG EC tablet  Take 1 tablet (81 mg total) by mouth every morning.  30 tablet  12   .  cholecalciferol (VITAMIN D) 1000 UNITS tablet  Take 1 tablet (1,000 Units total) by mouth daily.  100 tablet  3   .  FLUoxetine (PROZAC) 20 MG capsule  Take 40 mg by mouth daily.     .  fluticasone (FLONASE) 50 MCG/ACT nasal spray  Place 2 sprays into both nostrils at bedtime.     .  gabapentin (NEURONTIN) 300 MG capsule  Take 2 capsules (600 mg total) by mouth 2 (two) times daily.  60 capsule  1   .  losartan (COZAAR) 50 MG tablet  Take 1 tablet (50 mg total) by mouth daily.  30 tablet  1   .  metoprolol tartrate (LOPRESSOR) 25 MG tablet  Take 1 tablet (25 mg total) by mouth 2 (two) times daily.  60 tablet  1   .  Polyethyl Glycol-Propyl Glycol (SYSTANE ULTRA) 0.4-0.3 % SOLN  Place 2 drops into both eyes 2 (two) times daily as needed (dry eyes).     .  predniSONE (DELTASONE) 5 MG tablet  Take 1 tablet (5 mg total) by mouth every evening.  30 tablet  1   .  promethazine (PHENERGAN) 25 MG tablet  Take 1 tablet (25 mg total) by mouth every 6 (six) hours as needed for nausea or vomiting.  20 tablet  0   .  simvastatin (ZOCOR) 40 MG tablet  Take 1  tablet (40 mg total) by mouth daily with lunch.  30 tablet  1   .  docusate sodium (COLACE) 100 MG capsule  Take 1 capsule (100 mg total) by mouth 2 (two) times daily.  60 capsule  0    No current facility-administered medications on file prior to visit.  Review of Systems  Constitutional: Negative for appetite change, fatigue and unexpected weight change.  HENT: Positive for voice change. Negative for congestion, nosebleeds, sneezing, sore throat and trouble swallowing.   Eyes: Negative for itching and visual disturbance.  Respiratory: Negative for cough.   Cardiovascular: Negative for chest pain, palpitations and leg swelling.  Gastrointestinal: Negative for nausea, vomiting, diarrhea, blood in stool and abdominal distention.  Genitourinary: Negative for frequency and hematuria.  Musculoskeletal: Positive for arthralgias, back pain, gait problem, neck pain and neck stiffness. Negative for joint swelling.  Skin: Negative for rash and wound.  Neurological: Negative for dizziness, tremors, speech difficulty, weakness, light-headedness and headaches.  Psychiatric/Behavioral: Positive for sleep disturbance. Negative for suicidal ideas, dysphoric mood and agitation. The patient is not nervous/anxious.        Objective:   Physical Exam  Constitutional: He is oriented to person, place, and time. He appears well-developed. No distress.  NAD  HENT:  Mouth/Throat: Oropharynx is clear and moist.  hoarse  Eyes: Conjunctivae are normal. Pupils are equal, round, and reactive to light.  Neck: Normal range of motion. No JVD present. No thyromegaly present.  Cardiovascular: Normal rate, regular rhythm, normal heart sounds and intact distal pulses.  Exam reveals no gallop and no friction rub.   No murmur heard. Pulmonary/Chest: Effort normal and breath sounds normal. No respiratory distress. He has no wheezes. He has no rales. He exhibits no tenderness.  Abdominal: Soft. Bowel sounds are  normal. He exhibits no distension and no mass. There is no tenderness. There is no rebound and no guarding.  Musculoskeletal: Normal range of motion. He exhibits no edema and no tenderness.  Lymphadenopathy:    He has no cervical adenopathy.  Neurological: He is alert and oriented to person, place, and time. He has normal reflexes. No cranial nerve deficit. He exhibits normal muscle tone. He displays a negative Romberg sign. Coordination and gait normal.  No meningeal signs Cane LS is tender  Skin: Skin is warm and dry. No rash noted.  Psychiatric: He has a normal mood and affect. His behavior is normal. Judgment and thought content normal.     Lab Results  Component Value Date   WBC 15.2* 06/30/2013   HGB 13.9 06/30/2013   HCT 40.0 06/30/2013   PLT 412* 06/30/2013   GLUCOSE 120* 06/30/2013   CHOL 100 10/15/2010   TRIG 75.0 10/15/2010   HDL 36.70* 10/15/2010   LDLCALC 48 10/15/2010   ALT 9 06/16/2013   AST 16 06/16/2013   NA 133* 06/30/2013   K 4.4 06/30/2013   CL 94* 06/30/2013   CREATININE 0.53 06/30/2013   BUN 14 06/30/2013   CO2 23 06/30/2013   TSH 0.220* 04/13/2013   PSA 1.95 10/15/2010   INR 1.26 06/08/2013   HGBA1C 5.1 03/21/2013        Assessment & Plan:

## 2013-08-10 NOTE — Patient Instructions (Signed)
Gluten free trial (no wheat products) for 4-6 weeks. OK to use gluten-free bread and gluten-free pasta.  Milk free trial (no milk, ice cream, cheese and yogurt) for 4-6 weeks. OK to use almond, coconut, rice milk. "Almond breeze" brand tastes good.  Hold Simvastatin x 1-2 months

## 2013-08-10 NOTE — Progress Notes (Signed)
Pre visit review using our clinic review tool, if applicable. No additional management support is needed unless otherwise documented below in the visit note. 

## 2013-08-10 NOTE — Assessment & Plan Note (Addendum)
Continue with current prescription therapy as reflected on the Med list. Humira and Prdnisone

## 2013-08-11 ENCOUNTER — Telehealth: Payer: Self-pay | Admitting: Internal Medicine

## 2013-08-11 ENCOUNTER — Ambulatory Visit (HOSPITAL_COMMUNITY)
Admission: RE | Admit: 2013-08-11 | Discharge: 2013-08-11 | Disposition: A | Discharge status: Home / Self Care | Payer: Medicare Other | Source: Ambulatory Visit | Attending: Neurology | Admitting: Neurology

## 2013-08-11 DIAGNOSIS — R490 Dysphonia: Secondary | ICD-10-CM

## 2013-08-11 DIAGNOSIS — R1310 Dysphagia, unspecified: Secondary | ICD-10-CM

## 2013-08-11 DIAGNOSIS — R498 Other voice and resonance disorders: Secondary | ICD-10-CM

## 2013-08-11 NOTE — Procedures (Signed)
Objective Swallowing Evaluation: Modified Barium Swallowing Study  Patient Details  Name: [name] MRN: [id number] Date of Birth: [birth year]  Today's Date: 08/11/2013 Time: 1130-1300 SLP Time Calculation (min): 90 min  Past Medical History:  Past Medical History  Diagnosis Date  . Hx of colonic polyps   . Diverticulosis   . Hypertension   . 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 CAD (coronary artery disease)     minimal coronary plaque in the LAD and right coronary system. 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 7-06. Last nuclear stress 11-17-06 fixed anterior/ inferior defect, no inducible ischemia, EF 81%  . Allergic rhinitis   . Hx of colonoscopy   . Dysrhythmia 01-24-12    past hx. A.Fib x1 episode-responded to med.  . Depression   . GERD (gastroesophageal reflux disease)   . COPD (chronic obstructive pulmonary disease)     pt denies this hx on 05/24/2013  . Osteoarthritis   . RA (rheumatoid arthritis)   . Chronic back pain     "all over back" (06/16/2013)  . PONV (postoperative nausea and vomiting)   . High cholesterol   . Sleep apnea     "never dx'd; snores bad" (06/16/2013)  . Daily headache   . Falls frequently     "since 02/2013" (06/16/2013)  . Anxiety   . Narcotic abuse     per family  . Alcoholism /alcohol abuse     per family  . Dementia    Past Surgical History:  Past Surgical History  Procedure Laterality Date  . Appendectomy    . Total knee arthroplasty Right 2002     partial  . Shoulder hemi-arthroplasty Left 2008    partial  . Foot surgery Right 11-08    for removal of bone spurs-  . Ankle reconstruction Right 6-09    [facility]  . Posterior lumbar fusion  12-10    L4-5 diskectomy w/ fusion, cage placement and rods; [place]  . Partial knee arthroplasty Left   . Total shoulder replacement Right   . Joint replacement    . Cataract  extraction, bilateral Bilateral 01-24-12  . Orif shoulder fracture  02/06/2012    Procedure: OPEN REDUCTION INTERNAL FIXATION (ORIF) SHOULDER FRACTURE;  Surgeon: Mable Paris, MD;  Location: [facility];  Service: Orthopedics;  Laterality: Left;  ORIF of a Left Shoulder Fracture with  Iliac Crest Bone Graft aspiration   . Harvest bone graft  02/06/2012    Procedure: HARVEST ILIAC BONE GRAFT;  Surgeon: Mable Paris, MD;  Location: [facility];  Service: Orthopedics;;  bone marrow aspirqation   . Hardware removal  03/09/2012    Procedure: HARDWARE REMOVAL;  Surgeon: Mable Paris, MD;  Location: [facility];  Service: Orthopedics;  Laterality: Left;  Hardware Removal from Left Shoulder  . Shoulder arthroscopy Right 2013  . Carpal tunnel release Right 1990's  . Total ankle replacement Right 2008    at [facility]  . Hammer toe surgery Right 07/2012    "broke 4 hammertoes"   . Posterior lumbar fusion 4 level N/A 03/02/2013    Procedure: Lumbar One to Sacral One Posterior lumbar interbody fusion;  Surgeon: Karn Cassis, MD;  Location: [facility];  Service: Neurosurgery;  Laterality: N/A;  L1 to S1 Posterior lumbar interbody fusion  . Lumbar wound debridement N/A 03/17/2013  Procedure: Incision and drainage of superficial lumbar wound;  Surgeon: Karn Cassis, MD;  Location: [facility];  Service: Neurosurgery;  Laterality: N/A;  Incision and drainage of superficial lumbar wound  . Tonsillectomy    . Coronary angioplasty with stent placement  2006    "while repairing 1st stent, a second area tore and they had to place 2nd stent " ?LAD & CX  . Inguinal hernia repair Bilateral    HPI:  [name] is a 68 y.o. male with  hx of hemochromatosis, HTN COPD, RA . 03-02-13 pt underwent L1-S1 decompression and fusion. Post op wound infection s/p debridement on 03-17-13. He was seen in follow up in ID clinic on 04/12/13 and was dx with sepsis associated  hypotension  Pt has had hoarse voice for several months post-surgery; evaluated by ENT 04/16/13 (Results: hypopharynx, arytenoids, false vocal folds, and true vocal folds appeared normal without masses or lesions. The true vocal folds adducted and abducted normally bilaterally. The visualized portion of the subglottis appeared normal)  MBS was ordered to evaluate swallow function and safety, due to reports of choking later in the day.     Assessment / Plan / Recommendation Clinical Impression  Dysphagia Diagnosis: Mild pharyngeal phase dysphagia Clinical impression: Normal oral swallow.  Mild sensory based pharyngeal dysphagia, characterized primarily by delayed swallow reflex, with trigger noted at the pyriform on thin and nectar thick liquids.  Trigger occurred at the vallecula on puree and solids.  Pt was noted to have penetration and aspiration of thin and nectar thick liquids with no reflexive cough.  This was trace in amount and intermittent in occurance.  Compensatory positioning of chin tuck was ineffective in preventing airway compromise, however, individual small boluses decreased penetration/aspiration occurance.  Observation of what appeared to be osteophytes at C5-C6 and carotid calcification at C4 were also observed.  Radiologist present to confirm the latter.  Frequency of penetration seemed to increase as the study progressed, raising suspicion for adverse effect of fatigue on swallow safety.  Pt reported significant generalized fatigue by late afternoon.  Pt was encouraged to take in the majority of nutrition and hydration early in the day, eat smaller more frequent meals, avoid straws, and limit bolus size and rate.  Pt also indicated the tendency to take up to 12 pills at one time with a large bolus of water.  This was discouraged, with an alternate suggestion of taking fewer meds at one time with puree/ice cream.  Given the intermittent occurance of silent penetration and aspiration, pt was  encouraged to clear his throat intermittently throughout meals.  Additionally, repeat evaluation of vocal fold function by ENT is recommended, as well as referral to outpatient speech therapy for voice and swallowing therapy.  At such time that objective swallow assessment is warranted, FEES would be preferable over MBS, as both assessments allow visualization of penetration and aspiration, but also affords view of the vocal folds themselves.  Pt is concerned that his voice quality is still as hoarse as it is, given the length of time since onset.     Treatment Recommendation  Defer treatment plan to SLP at (Comment) (outpatient clinic.)    Diet Recommendation Thin liquid;Regular   Liquid Administration via: No straw;Cup Medication Administration: Whole meds with liquid (one at a time) Supervision: Patient able to self feed Compensations: Slow rate;Small sips/bites;Clear throat intermittently Postural Changes and/or Swallow Maneuvers: Seated upright 90 degrees;Upright 30-60 min after meal    Other  Recommendations Recommended  Consults: Consider ENT evaluation (recommend repeat laryngoscopy to view vocal fold function, given continued hoarseness.) Oral Care Recommendations: Oral care BID   Follow Up Recommendations  Outpatient SLP (Repeat objective study with Outpatient FEES)    Frequency and Duration  per OP SLP     Pertinent Vitals/Pain VSS, no pain reported    SLP Swallow Goals  defer to OP SLP   General Date of Onset: 08/11/13 HPI: RAIDEL HANEY is a 68 y.o. male with  hx of hemochromatosis, HTN COPD, RA . 03-02-13 pt underwent L1-S1 decompression and fusion. Post op wound infection s/p debridement on 03-17-13. He was seen in follow up in ID clinic on 04/12/13 and was dx with sepsis associated hypotension  Pt has had hoarse voice for several months post-surgery; evaluated by ENT 04/16/13 (Results: hypopharynx, arytenoids, false vocal folds, and true vocal folds appeared normal  without masses or lesions. The true vocal folds adducted and abducted normally bilaterally. The visualized portion of the subglottis appeared normal)  MBS was ordered to evaluate swallow function and safety, due to reports of choking later in the day. Type of Study: Modified Barium Swallowing Study Reason for Referral: Objectively evaluate swallowing function Previous Swallow Assessment: none found Diet Prior to this Study: Regular;Thin liquids Temperature Spikes Noted: No Respiratory Status: Room air History of Recent Intubation: No Behavior/Cognition: Alert;Cooperative;Pleasant mood Oral Cavity - Dentition: Adequate natural dentition Oral Motor / Sensory Function: Within functional limits Self-Feeding Abilities: Able to feed self Patient Positioning: Upright in chair Baseline Vocal Quality: Hoarse;Low vocal intensity;Breathy Volitional Cough: Weak Volitional Swallow: Able to elicit Anatomy: Within functional limits Pharyngeal Secretions: Not observed secondary MBS    Reason for Referral Objectively evaluate swallowing function   Oral Phase Oral Preparation/Oral Phase Oral Phase: WFL   Pharyngeal Phase Pharyngeal Phase Pharyngeal Phase: Impaired Pharyngeal - Nectar Pharyngeal - Nectar Cup: Delayed swallow initiation;Premature spillage to valleculae;Premature spillage to pyriform sinuses;Penetration/Aspiration during swallow;Reduced airway/laryngeal closure Penetration/Aspiration details (nectar cup): Material enters airway, remains ABOVE vocal cords then ejected out;Material does not enter airway Pharyngeal - Thin Pharyngeal - Thin Cup: Delayed swallow initiation;Premature spillage to valleculae;Premature spillage to pyriform sinuses;Penetration/Aspiration during swallow;Reduced airway/laryngeal closure;Trace aspiration Penetration/Aspiration details (thin cup): Material does not enter airway;Material enters airway, passes BELOW cords without attempt by patient to eject out (silent  aspiration) (penetration/aspiration inconsistent) Pharyngeal - Thin Straw: Delayed swallow initiation;Premature spillage to valleculae;Premature spillage to pyriform sinuses;Penetration/Aspiration during swallow;Reduced airway/laryngeal closure;Trace aspiration Penetration/Aspiration details (thin straw): Material enters airway, remains ABOVE vocal cords and not ejected out;Material enters airway, passes BELOW cords without attempt by patient to eject out (silent aspiration);Material enters airway, remains ABOVE vocal cords then ejected out (penetration/aspiration inconsistent) Pharyngeal - Solids Pharyngeal - Puree: Delayed swallow initiation;Premature spillage to valleculae Pharyngeal - Multi-consistency: Delayed swallow initiation;Premature spillage to valleculae Pharyngeal - Pill: Delayed swallow initiation;Premature spillage to valleculae  Cervical Esophageal Phase    GO    Cervical Esophageal Phase Cervical Esophageal Phase: [facility]    Functional Assessment Tool Used: ASHA NOMS, clinical judgment and objective assessment of swallow function. Functional Limitations: Swallowing Swallow Current Status [phone]): At least 20 percent but less than 40 percent impaired, limited or restricted Swallow Goal Status [phone]): At least 20 percent but less than 40 percent impaired, limited or restricted Swallow Discharge Status [phone]): At least 20 percent but less than 40 percent impaired, limited or restricted   Lael Pilch B. Mur[facility], CCC-SLP [phone]  Gray Bernhardt 08/11/2013, 2:02 PM

## 2013-08-11 NOTE — Telephone Encounter (Signed)
Relevant patient education assigned to patient using Emmi. ° °

## 2013-08-12 LAB — URINALYSIS
Bilirubin Urine: NEGATIVE
Hgb urine dipstick: NEGATIVE
Ketones, ur: NEGATIVE
Leukocytes, UA: NEGATIVE
Nitrite: NEGATIVE
Specific Gravity, Urine: <=1.005 — AB (ref 1.000–1.030)
Total Protein, Urine: NEGATIVE
Urine Glucose: NEGATIVE
Urobilinogen, UA: 0.2 (ref 0.0–1.0)
pH: 6.5 (ref 5.0–8.0)

## 2013-08-16 ENCOUNTER — Other Ambulatory Visit: Payer: Self-pay | Admitting: Internal Medicine

## 2013-08-17 ENCOUNTER — Telehealth: Payer: Self-pay | Admitting: Neurology

## 2013-08-17 DIAGNOSIS — R1310 Dysphagia, unspecified: Secondary | ICD-10-CM

## 2013-08-17 DIAGNOSIS — R471 Dysarthria and anarthria: Secondary | ICD-10-CM

## 2013-08-17 NOTE — Telephone Encounter (Signed)
Pt called states he went to the hospital as Dr. Rexene Alberts advised, they saw pt and advised pt to call our office back to set up with the speech therapist that is beside [place]. Please call pt concerning this matter. Thanks

## 2013-08-20 NOTE — Telephone Encounter (Signed)
Called pt and left message informing him per Dr. Rexene Alberts the the speech therapy referral has been put in and someone would be calling him concerning this matter and if he has any other problems, questions or concerns to call the office.

## 2013-08-20 NOTE — Telephone Encounter (Signed)
Referral placed to speech therapy. Please take care of referral, thx

## 2013-08-20 NOTE — Telephone Encounter (Signed)
Pt is calling back states he hasn't heard from anyone and is wanting Dr. Rexene Alberts to give him a call or someone concerning the Speech Therapist that he is suppose to be referred over to. Thanks

## 2013-08-20 NOTE — Telephone Encounter (Signed)
Pt calling about the speech referral that was [place] to be put [place] on the OV note on 07/26/13. Pt would like Dr. Rexene Alberts to give them a call back please. Thanks

## 2013-08-23 ENCOUNTER — Telehealth: Payer: Self-pay | Admitting: *Deleted

## 2013-08-23 NOTE — Telephone Encounter (Signed)
Rf req for Daliresp 500 mcg 1 po qd. # 30. Med has been d/c'd per chart. Ok to Rf?

## 2013-08-24 MED ORDER — ROFLUMILAST 500 MCG PO TABS: 500.0000 ug | ORAL_TABLET | Freq: Every day | ORAL | Status: DC

## 2013-08-24 NOTE — Telephone Encounter (Signed)
Patient calling to follow up on his speech therapy referral, states he has not heard anything yet. Please call patient and advise.

## 2013-08-24 NOTE — Telephone Encounter (Signed)
I missed that the pt had MBSS already on 08-11-13.  I spoke to son, [name] and relayed this.  I spoke to Angie, with [facility] and she will call pt to schedule.  Pt can be called and cell # given.  [phone].  I gave the Neuro rehab # to son, also.

## 2013-08-24 NOTE — Telephone Encounter (Signed)
I faxed to [place] at [facility] [phone].

## 2013-08-24 NOTE — Telephone Encounter (Signed)
I spoke to Angie at [facility].  She stated due to diagnosis of the pt, Modified Barium Swallow will need to be done first.  I called and relayed this to pt via VM.  He is to call back if questions.  Dr. Rexene Alberts notified re: order needed.

## 2013-08-24 NOTE — Telephone Encounter (Signed)
OK to fill this prescription with additional refills x5 Thank you!  

## 2013-08-24 NOTE — Telephone Encounter (Signed)
Done

## 2013-08-27 ENCOUNTER — Other Ambulatory Visit: Payer: Self-pay | Admitting: Internal Medicine

## 2013-08-27 ENCOUNTER — Ambulatory Visit: Payer: Medicare Other | Attending: Neurology

## 2013-08-27 ENCOUNTER — Telehealth: Payer: Self-pay | Admitting: Neurology

## 2013-08-27 DIAGNOSIS — R1313 Dysphagia, pharyngeal phase: Secondary | ICD-10-CM | POA: Diagnosis not present

## 2013-08-27 DIAGNOSIS — IMO0001 Reserved for inherently not codable concepts without codable children: Secondary | ICD-10-CM | POA: Diagnosis present

## 2013-08-27 DIAGNOSIS — R490 Dysphonia: Secondary | ICD-10-CM | POA: Insufficient documentation

## 2013-08-27 DIAGNOSIS — R1310 Dysphagia, unspecified: Secondary | ICD-10-CM

## 2013-08-27 DIAGNOSIS — R498 Other voice and resonance disorders: Secondary | ICD-10-CM

## 2013-08-27 MED ORDER — SUMATRIPTAN SUCCINATE 100 MG PO TABS: 100.0000 mg | ORAL_TABLET | ORAL | Status: DC | PRN

## 2013-08-27 NOTE — Telephone Encounter (Signed)
Placed referral.  I made appt for pt with [facility] [place]. [id number], [phone].  Appt made June 6, Tuesday 1140, be there 1120. P/w to be mailed.  I LMVM for pt to confirm.   [place], [place].

## 2013-08-27 NOTE — Telephone Encounter (Signed)
Pls put in referral as requested, thx

## 2013-09-01 ENCOUNTER — Telehealth: Payer: Self-pay | Admitting: *Deleted

## 2013-09-01 NOTE — Telephone Encounter (Signed)
[facility] Triage Call Report Triage Record Num: [id number] Operator: Merrilee Seashore Patient Name: [name] Call Date & Time: 08/27/2013 5:16:01PM Patient Phone: [phone] PCP: Adella Hare Patient Gender: Male PCP Fax : Patient DOB: 27-Oct-1945 Practice Name: [facility] Reason for Call: Caller: [name]/Patient; PCP: [name] (Adults only); CB#: [phone]; Call regarding Medication Issue...migraine; Seen in office today and was told a Rx for Imitrex would be sent in to Pharmacy. Verified per EPIC rx sent to "print". V/O called to Pharmacy on file [facility] [phone]. Imitrex 1 tablet (100 mg total) by mouth every 2 (two) hours as needed for migraine or headache. #10. No refills. Patient aware. Protocol(s) Used: Office Note Recommended Outcome per Protocol: Information Noted and Sent to Office Reason for Outcome: Caller information to office Care Advice: ~ 05/

## 2013-09-02 ENCOUNTER — Other Ambulatory Visit: Payer: Self-pay | Admitting: *Deleted

## 2013-09-02 MED ORDER — POTASSIUM CHLORIDE CRYS ER 20 MEQ PO TBCR: 20.0000 meq | EXTENDED_RELEASE_TABLET | Freq: Two times a day (BID) | ORAL | Status: DC

## 2013-09-03 ENCOUNTER — Encounter: Payer: Medicare Other | Admitting: Internal Medicine

## 2013-09-03 DIAGNOSIS — Z0289 Encounter for other administrative examinations: Secondary | ICD-10-CM

## 2013-09-06 ENCOUNTER — Telehealth: Payer: Self-pay | Admitting: *Deleted

## 2013-09-06 DIAGNOSIS — M542 Cervicalgia: Secondary | ICD-10-CM

## 2013-09-06 DIAGNOSIS — M436 Torticollis: Secondary | ICD-10-CM

## 2013-09-06 DIAGNOSIS — R51 Headache: Secondary | ICD-10-CM

## 2013-09-06 NOTE — Telephone Encounter (Signed)
Called patient back concerning his headache  debilitating headache for about 2 weeks. Yesterday was really bad no one answered the patients wants to be seen as possible, he  Has not been seen here for headaches before. He last seen Dr. Rexene Alberts on 12-15-2012 her assessment  was as followed........ In summary, [name] is a very pleasant 68 y.o.-year old male with a complex medical history, who presents with abrupt onset vertigo, incoordination, AMS and speech impairment in April 2014 and was hospitalized from clinic on 07/14/2012. Work up thus far has shown CSF increase protein, increased IgG index and positive OCB. His AMS has resolved and exam is a little better, he also benefited from therapy which is now completed. DDx include, cerebellitis, meningitis/encephalitis, metabolic/toxic, autoimmune/paraneoplastic. Recent paraneoplastic testing was negative. UDS and EtOH was neg. EEG normal, negative chest x-ray and negative CT abdomen and pelvis for any mass. He is on immune suppression with Humira. His brain MRI did not show any demyelinating process or mass or enhancing lesion. I will follow him clinically; he is stable at thist time. I would like to see him back in 3 months for recheck and we will consider a second opinion at a tertiary [facility] if the need arises and a repeat brain MRI. He and his wife were in agreement. He is advised to call in the interim for any questions, concerns, problems or updates. Please advise

## 2013-09-06 NOTE — Telephone Encounter (Signed)
HA 2 weeks, with  Associated symptoms of? Please send to RN to clarify .  He was last seen 9 month ago!  RV was scheduled 3 month ago- this is a new problem ,  Due to [facility] will order MRI brain . please call PCP- This patient is immune suppressed on HUMIRA and should go to the Emergency room. This could be a encephalitis.   this was never evaluated at this office.   He can be seen tomorrow by me , 11 AM..

## 2013-09-07 ENCOUNTER — Ambulatory Visit: Payer: Medicare Other

## 2013-09-07 MED ORDER — VALPROATE SODIUM 500 MG/5ML IV SOLN: 500.0000 mg | Freq: Once | INTRAVENOUS | Status: DC

## 2013-09-07 NOTE — Telephone Encounter (Signed)
HA strated before HUMIRA , had only 2 injection. Nauseated with HA, bright lights make HA worse. He has neck stiffness - could be meningism.   will come in for Depakote tomorrow Iv and if that doesn't help, needs LP with CSF - had a last tap in January.   Dr Joya Salm did his  back surgery/ .he has seen PCP as we asked, unfamiliar with the HA, too.  THEpatient agreed to come in tomorrow at 10.30 for IV treatment  This message was again forwarded to [place] .

## 2013-09-07 NOTE — Telephone Encounter (Signed)
Please send this patient ot the ED with headaches, new onset , on HUMIRA>

## 2013-09-08 ENCOUNTER — Encounter: Payer: Medicare Other | Admitting: Internal Medicine

## 2013-09-08 ENCOUNTER — Ambulatory Visit (INDEPENDENT_AMBULATORY_CARE_PROVIDER_SITE_OTHER): Payer: Medicare Other | Admitting: *Deleted

## 2013-09-08 VITALS — BP 126/82 | HR 65 | Temp 97.1°F

## 2013-09-08 DIAGNOSIS — Z0279 Encounter for issue of other medical certificate: Secondary | ICD-10-CM

## 2013-09-08 DIAGNOSIS — R51 Headache: Secondary | ICD-10-CM

## 2013-09-08 MED ORDER — VALPROATE SODIUM 500 MG/5ML IV SOLN
500.0000 mg | INTRAVENOUS | Status: DC
  Administered 2013-09-08: 500 mg via INTRAVENOUS

## 2013-09-08 MED ORDER — SODIUM CHLORIDE 0.9 % IV SOLN
500.0000 mg | INTRAVENOUS | Status: DC
  Administered 2013-09-08: 500 mg via INTRAVENOUS

## 2013-09-08 NOTE — Progress Notes (Signed)
Pt here for depacon infusion.  Directed to treatment room made comfortable in recliner.  Instructions given for depacon infusion.  Pt does not have headache now but does have neck pain.  I consulted with Dr. Brett Fairy, she came and saw pt.  Will proceed with Depacon infusion, (relayed that only one vial of depacon 52m in stock).  Ok to give this along with Solumedrol 5039mIV.  Under aseptic technique 24g angiocath inserted to R AC with good blood return, infusion of Depacon 50031m 0.9%NS 100m10marted at 1112, taped securely/ dressing dry and intact.  R arm on pillow.  Level 1 - pain around L temple and L eye.  Offered water to drinSafeway Ince has been having headaches (treating with excedrin migraine, motrin, from pcp imitrex).  Helped some and then comes back.  Depacon finished, headache to level 0, and neck pain gone.  Solumedrol 500mg57m.9% NS started at 1122, finished 1146.  IV discontinued, catheter tip intact.  Bp 142/85, P-64.  Pt states ok.  NAD noted.  Accompanied to check out.  Made appt with Dr. AtharRexene Albertsconsult of headaches.  NDC [id number], NDC [id number], NDC 0[facility]-[phone] x2.

## 2013-09-08 NOTE — Patient Instructions (Signed)
Pt accompanied to check out.

## 2013-09-08 NOTE — Telephone Encounter (Signed)
Patient had appointment for infusion today.  See clinic support note 09-08-13.

## 2013-09-09 NOTE — Telephone Encounter (Signed)
09-08-13  Brief visit with the patient, who explained his HA begins in the left temple and as  Retro-orbital pressure , than moves to the right side. Photophobia, nausea. Neck stiffness.  He responded well to Depakote 500 mg and after the IV solumedrol  Dose of 500 mg was actually HA free and had normal  ROM for his neck.  Instructed to speak with Dr. Rexene Alberts on Friday and with his PCP, Dr Alain Marion later this Wednesday.   Larey Seat, MD

## 2013-09-10 ENCOUNTER — Ambulatory Visit: Payer: Medicare Other | Attending: Neurology

## 2013-09-10 DIAGNOSIS — IMO0001 Reserved for inherently not codable concepts without codable children: Secondary | ICD-10-CM | POA: Insufficient documentation

## 2013-09-10 DIAGNOSIS — R490 Dysphonia: Secondary | ICD-10-CM | POA: Diagnosis not present

## 2013-09-10 DIAGNOSIS — R1313 Dysphagia, pharyngeal phase: Secondary | ICD-10-CM | POA: Insufficient documentation

## 2013-09-14 ENCOUNTER — Ambulatory Visit: Payer: Medicare Other

## 2013-09-14 DIAGNOSIS — IMO0001 Reserved for inherently not codable concepts without codable children: Secondary | ICD-10-CM | POA: Diagnosis not present

## 2013-09-18 ENCOUNTER — Ambulatory Visit
Admission: RE | Admit: 2013-09-18 | Discharge: 2013-09-18 | Disposition: A | Discharge status: Home / Self Care | Payer: Medicare Other | Source: Ambulatory Visit | Attending: Neurology | Admitting: Neurology

## 2013-09-18 DIAGNOSIS — R51 Headache: Secondary | ICD-10-CM

## 2013-09-18 MED ORDER — GADOBENATE DIMEGLUMINE 529 MG/ML IV SOLN
13.0000 mL | Freq: Once | INTRAVENOUS | Status: AC | PRN
  Administered 2013-09-18: 13 mL via INTRAVENOUS

## 2013-09-20 ENCOUNTER — Ambulatory Visit (INDEPENDENT_AMBULATORY_CARE_PROVIDER_SITE_OTHER): Payer: Medicare Other | Admitting: Neurology

## 2013-09-20 ENCOUNTER — Other Ambulatory Visit: Payer: Self-pay | Admitting: Internal Medicine

## 2013-09-20 ENCOUNTER — Encounter: Payer: Self-pay | Admitting: Neurology

## 2013-09-20 VITALS — BP 143/77 | HR 71 | Ht 65.5 in | Wt 151.0 lb

## 2013-09-20 DIAGNOSIS — R270 Ataxia, unspecified: Secondary | ICD-10-CM

## 2013-09-20 DIAGNOSIS — R498 Other voice and resonance disorders: Secondary | ICD-10-CM

## 2013-09-20 DIAGNOSIS — G43719 Chronic migraine without aura, intractable, without status migrainosus: Secondary | ICD-10-CM

## 2013-09-20 DIAGNOSIS — R51 Headache: Secondary | ICD-10-CM

## 2013-09-20 DIAGNOSIS — M069 Rheumatoid arthritis, unspecified: Secondary | ICD-10-CM

## 2013-09-20 DIAGNOSIS — R279 Unspecified lack of coordination: Secondary | ICD-10-CM

## 2013-09-20 MED ORDER — DIVALPROEX SODIUM ER 250 MG PO TB24: 250.0000 mg | ORAL_TABLET | Freq: Every day | ORAL | Status: DC

## 2013-09-20 NOTE — Progress Notes (Signed)
Subjective:    Patient ID: [name] is a 68 y.o. male.  HPI    Interim history:   [name] is a 68 year old right-handed gentleman with an underlying complex medical history of hypertension, osteoarthritis, diverticulosis, hemachromatosis, coronary artery disease, allergic rhinitis, rheumatoid arthritis, and COPD, who presents for a sooner than scheduled appointment for new onset headaches. I have seen him before for altered mental status and balance problems including evidence of ataxia. He is accompanied by his friend today. I last saw him on 07/26/2013, at which time we discussed his multiple recent hospitalizations. He reported problems with hoarseness and softer speech and I referred him to ENT. I also ordered a swallow study. This was done on 08/11/2013 and showed mild sensory base pharyngeal dysphagia, delayed swallow reflex, penetration and aspiration of thin and nectar thick liquids with no reflexive cough, compensatory positioning with chin talk was ineffective, however individual small boluses decreased penetration and aspiration. He was advised to followup with speech therapy. I made a referral in that regard. The patient called on 09/06/2013 reporting a two-week headache. Dr. Brett Fairy saw him on my behalf as I was not in the office that week, and he was treated in our office with Depacon infusion as well as IV Solu-Medrol, 500 mg each. He improved. Dr. Brett Fairy ordered a repeat brain MRI and he had a brain MRI with and without contrast on 09/18/2013: Abnormal MRI scan of the brain showing mild changes of chronic microvascular ischemia and generalized cerebral atrophy. Overall no significant change compared with previous MRI scan dated 06/30/2012. In addition, personally reviewed the images through the PACS system.  Today, he reports, that his headaches started in March 2015 and he reports a typical right sided. He tried Imitrex per PCP about a month ago, and it did not help at  100 mg strength. He recently had a cough and other family members had a cough. He felt, that the IV Depacon helped a lot. He saw Dr. Joya Gaskins in ENT and had a scope. He was told he had no vocal cord paralysis. He had ST, which helped, with Garald Balding. He has noted, that ASA has helped for HAs some. He is on Prednisone 10 mg daily. He has had no recent fevers. Mucinex has helped. His HA currently is a 3/10. He has had some nausea, rare vomiting. He has some photophobia. It helps to sleep. He has not had any one-sided weakness or numbness or tingling, no slurring of speech or droopy face. He does not have a personal or family history of migraines.  I saw him on 12/15/2012, at which time we talked about previous test results again. We talked about his hospitalization from April 2014. Overall he had improved at the time. Workup had shown increased CSF protein, increased IgG index, positive oligoclonal bands. This of course in the context of autoimmune disease, namely rheumatoid arthritis, on Humira. EEG was negative. We looked for cancer with screening CT abdomen pelvis and chest x-ray. He has been on immune suppressant, Humira. Brain MRI did not show any demyelinating process or mass or enhancing lesions. I considered a second opinion at a larger [facility] and a repeat brain MRI. His last CTH was on 06/30/13: Involutional and chronic changes without evidence of focal or acute abnormalities.  On 03/02/2013 he had lower spine surgery with decompression of the thecal sac and facetectomy, L2-3 discectomy, removal of herniated disc, placement of pedicle screws L1-3 and S1, fusion from L1-S1 involving the L4-5  fusion; this was under Dr. Joya Salm. Postoperatively he developed hypotension. He was discharged on 03/09/2013 to a skilled nursing facility. On 03/16/2013 he developed fever and vomiting and was readmitted under neurosurgery. He was found to have hypovolemia, hyponatremia and elevation in BUN and creatinine. On  03/17/2013 he had revision of a lumbar wound.  On 03/19/2013 he was seen by infectious disease for a lumbar wound infection and treatment thereof. He was discharged to home on 04/06/2013 after being treated for a wound infection with IV antibiotics. He presented on 04/13/2013 with fever and hypotension and was sent to the emergency room for further workup and treatment. ENT was consulted for hoarseness and GI was consulted for concern for small bowel obstruction which was treated conservatively. He was discharged on 04/24/2013. He has had multiple falls with and without injuries, most recently a laceration to his face on 07/01/2013. He has had [facility] and speech therapy. He lives with his son currently. He is supposed to have 24 hour supervision and has not been fully compliant with the use of assistive devices as recommended by home health PT.  He was admitted on 05/06/2013 for AMS, deemed secondary to polypharmacy including opioid overdose, presenting with mental status changes, acute renal failure, acute respiratory failure with hypoxia. He was discharged on 05/09/2013.  He was admitted on 05/23/2013 with generalized weakness, fever, nausea and vomiting. He was transferred to inpatient rehabilitation on 05/27/2013. He was discharged on 06/10/2013.  He was admitted on 06/16/2013 for altered mental status. He was discharged on 06/22/2013. He presented to the emergency room on 06/30/2013 for fever. He presented to the emergency room on 07/01/2013 for a fall.  I saw him on 10/02/2012 after a recent hospitalization.  I first met him on 07/14/2012 at the request of Dr. Ronnald Ramp. He has an underlying complex medical history including rheumatoid arthritis, osteoarthritis, COPD, atrial fibrillation, anemia, hypertension, heart disease, reported fairly sudden onset unsteadiness of his gait, vertigo, dizziness, blurry vision since early March 2014 there was no significant prodromal illness. His wife had noted  some slurring of speech. Head CT on 06/22/2012 showed no acute abnormality. 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 He also reported some nausea for which meclizine and scopolamine patch helped. I arranged for a hospital admission from clinic because of concerns with cerebellar dysfunction including dysarthria, nystagmus, but also some alteration in his mental status. The patient was not fully oriented and was sluggish in his responses and seemed confused, talking out of context at times. He had mild dysmetria bilaterally.  CSF showed: increased protein, increased IgG index and positive oligoclonal bands in his CSF. His altered mental status improved and his exam was better and he benefited from physical therapy which he completed. I felt that he had a variety of differential diagnoses possible including cerebellitis, and cephalitis, metabolic-toxic or other immune-paraneoplastic causes of his presentation. I recommended paraneoplastic testing. EEG was normal, chest x-ray was negative, CT abdomen and pelvis were negative. Of note, he has been on immune suppression with Humira. MRI did not show any demyelinating lesions.  His paraneoplastic testing was negative in July 2014. He had foot surgery on the right. He has been on Humira for the past 2 years, [place] injections every other week.   His Past Medical History Is Significant For: Past Medical History  Diagnosis Date  . Hx of colonic polyps   . Diverticulosis   . Hypertension   . Hemochromatosis  dx'd 14 yrs ago last ferritin Aug 11, 08 52 (22-322), Fe 136  . CAD (coronary artery disease)     minimal coronary plaque in the LAD and right coronary system. 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 7-06. Last nuclear stress 11-17-06 fixed anterior/ inferior defect, no inducible ischemia, EF 81%  . Allergic rhinitis   . Hx of  colonoscopy   . Dysrhythmia 01-24-12    past hx. A.Fib x1 episode-responded to med.  . Depression   . GERD (gastroesophageal reflux disease)   . COPD (chronic obstructive pulmonary disease)     pt denies this hx on 05/24/2013  . Osteoarthritis   . RA (rheumatoid arthritis)   . Chronic back pain     "all over back" (06/16/2013)  . PONV (postoperative nausea and vomiting)   . High cholesterol   . Sleep apnea     "never dx'd; snores bad" (06/16/2013)  . Daily headache   . Falls frequently     "since 02/2013" (06/16/2013)  . Anxiety   . Narcotic abuse     per family  . Alcoholism /alcohol abuse     per family  . Dementia     His Past Surgical History Is Significant For: Past Surgical History  Procedure Laterality Date  . Appendectomy    . Total knee arthroplasty Right 2002     partial  . Shoulder hemi-arthroplasty Left 2008    partial  . Foot surgery Right 11-08    for removal of bone spurs-  . Ankle reconstruction Right 6-09    [facility]  . Posterior lumbar fusion  12-10    L4-5 diskectomy w/ fusion, cage placement and rods; [facility]  . Partial knee arthroplasty Left   . Total shoulder replacement Right   . Joint replacement    . Cataract extraction, bilateral Bilateral 01-24-12  . Orif shoulder fracture  02/06/2012    Procedure: OPEN REDUCTION INTERNAL FIXATION (ORIF) SHOULDER FRACTURE;  Surgeon: Nita Sells, MD;  Location: [facility];  Service: Orthopedics;  Laterality: Left;  ORIF of a Left Shoulder Fracture with  Iliac Crest Bone Graft aspiration   . Harvest bone graft  02/06/2012    Procedure: HARVEST ILIAC BONE GRAFT;  Surgeon: Nita Sells, MD;  Location: [facility];  Service: Orthopedics;;  bone marrow aspirqation   . Hardware removal  03/09/2012    Procedure: HARDWARE REMOVAL;  Surgeon: Nita Sells, MD;  Location: [place];  Service: Orthopedics;  Laterality: Left;  Hardware Removal from Left Shoulder  . Shoulder arthroscopy  Right 2013  . Carpal tunnel release Right 1990's  . Total ankle replacement Right 2008    at [facility]  . Hammer toe surgery Right 07/2012    "broke 4 hammertoes"   . Posterior lumbar fusion 4 level N/A 03/02/2013    Procedure: Lumbar One to Sacral One Posterior lumbar interbody fusion;  Surgeon: Floyce Stakes, MD;  Location: [place] [facility];  Service: Neurosurgery;  Laterality: N/A;  L1 to S1 Posterior lumbar interbody fusion  . Lumbar wound debridement N/A 03/17/2013    Procedure: Incision and drainage of superficial lumbar wound;  Surgeon: Floyce Stakes, MD;  Location: [place] [facility];  Service: Neurosurgery;  Laterality: N/A;  Incision and drainage of superficial lumbar wound  . Tonsillectomy    . Coronary angioplasty with stent placement  2006    "while repairing 1st stent, a second area tore and they had  to place 2nd stent " ?LAD & CX  . Inguinal hernia repair Bilateral    His Family History Is Significant For: Family History  Problem Relation Age of Onset  . Uterine cancer Mother     survivor  . Macular degeneration Mother   . Other Mother     ankle edema  . Lung cancer Mother   . Cancer Mother     ovarian, lung  . Coronary artery disease Father   . Hypertension Father   . Prostate cancer Father   . Colon polyps Father   . Cancer Father     prostate  . Thyroid disease Sister   . Heart attack Brother   . Hyperlipidemia Brother   . Other Brother     Schizophrenic  . Diabetes Neg Hx   . Colon cancer Neg Hx   . Esophageal cancer Neg Hx   . Rectal cancer Neg Hx   . Stomach cancer Neg Hx   . Coronary artery disease Maternal Aunt   . Heart attack Maternal Aunt   . Rheum arthritis Sister   . Hemochromatosis Sister     His Social History Is Significant For: History   Social History  . Marital Status: Divorced    Spouse Name: N/A    Number of Children: 2  . Years of Education: 16   Occupational History  . OWNER & SALES     self employed   Social History Main Topics   . Smoking status: Never Smoker   . Smokeless tobacco: Never Used  . Alcohol Use: 0.0 oz/week     Comment: 06/16/2013 "used to drink alot; was pretty bad about it; don't think he's had any alcohol in the past year"  . Drug Use: No  . Sexual Activity: Yes    Partners: Female   Other Topics Concern  . None   Social History Narrative   [employer] - BA. Married 1972-60yr seperated/divorced. Engaged April '11. 1 son '74; step-daughter '70. 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 During '11 discovered an [employer]- $500,000+ loss. Is handling this OK - will keep the business running.    His Allergies Are:  Allergies  Allergen Reactions  . Morphine And Related Shortness Of Breath, Nausea And Vomiting, Swelling and Other (See Comments)    Agitation, tolerates dilaudid  . Penicillins Hives, Shortness Of Breath and Rash    REACTION: hives, breathing problems  . Cefixime     rash  . Percocet [Oxycodone-Acetaminophen] Anxiety    Takes OxyContin at home.  :   His Current Medications Are:  Outpatient Encounter Prescriptions as of 09/20/2013  Medication Sig  . Adalimumab (HUMIRA PEN [place]) Inject into the skin.  .Marland Kitchenaspirin (ASPIRIN EC) 81 MG EC tablet Take 1 tablet (81 mg total) by mouth every morning.  . cholecalciferol (VITAMIN D) 1000 UNITS tablet Take 1 tablet (1,000 Units total) by mouth daily.  . diphenhydrAMINE (BENADRYL) 25 MG tablet Take 25 mg by mouth daily.  .Marland Kitchendocusate sodium (COLACE) 100 MG capsule Take 1 capsule (100 mg total) by mouth 2 (two) times daily.  .Marland KitchenFLUoxetine (PROZAC) 20 MG capsule Take 40 mg by mouth daily.  .Marland Kitchengabapentin (NEURONTIN) 300 MG capsule Take 2 capsules (600 mg total) by mouth 2 (two) times daily.  . hydrochlorothiazide (HYDRODIURIL) 25 MG tablet TAKE 1 TABLET (25 MG TOTAL) BY MOUTH DAILY.  .Marland Kitchenlosartan (COZAAR)  50 MG tablet Take 1 tablet  (50 mg total) by mouth daily.  . metoprolol tartrate (LOPRESSOR) 25 MG tablet Take 1 tablet (25 mg total) by mouth 2 (two) times daily.  . metoprolol tartrate (LOPRESSOR) 25 MG tablet TAKE 1 TABLET BY MOUTH TWICE A DAY  . naproxen sodium (ANAPROX) 220 MG tablet Take 220 mg by mouth 2 (two) times daily with a meal.  . pantoprazole (PROTONIX) 20 MG tablet Take 40 mg by mouth 2 (two) times daily.  Vladimir Faster Glycol-Propyl Glycol (SYSTANE ULTRA) 0.4-0.3 % SOLN Place 2 drops into both eyes 2 (two) times daily as needed (dry eyes).   . potassium chloride SA (KLOR-CON M20) 20 MEQ tablet Take 1 tablet (20 mEq total) by mouth 2 (two) times daily.  . predniSONE (DELTASONE) 5 MG tablet Take 1 tablet (5 mg total) by mouth every evening.  . promethazine (PHENERGAN) 25 MG tablet Take 1 tablet (25 mg total) by mouth every 6 (six) hours as needed for nausea or vomiting.  . roflumilast (DALIRESP) 500 MCG TABS tablet Take 1 tablet (500 mcg total) by mouth daily.  . simvastatin (ZOCOR) 20 MG tablet Take 1 tablet (20 mg total) by mouth at bedtime.  . sodium chloride 0.9 % SOLN 100 mL with valproate 500 MG/5ML SOLN 500 mg Inject 500 mg into the vein once.  . SUMAtriptan (IMITREX) 100 MG tablet Take 1 tablet (100 mg total) by mouth every 2 (two) hours as needed for migraine or headache. May repeat in 2 hours if headache persists or recurs.  . traZODone (DESYREL) 100 MG tablet Take 1 tablet by mouth at bedtime as needed.  :  Review of Systems:  Out of a complete 14 point review of systems, all are reviewed and negative with the exception of these symptoms as listed below:   Review of Systems  Constitutional: Positive for chills and fatigue.  Respiratory: Positive for cough.   Endocrine: Positive for cold intolerance.  Musculoskeletal: Positive for joint swelling and neck pain.       Joint pain  Neurological: Positive for headaches.  Psychiatric/Behavioral:       Daytime sleepiness    Objective:  Neurologic  Exam  Physical Exam Physical Examination:   Filed Vitals:   09/20/13 1329  BP: 143/77  Pulse: 71    General Examination: The patient is a very pleasant 68 y.o. male in no acute distress.  HEENT: Normocephalic, atraumatic, pupils are equal, round and reactive to light and accommodation. Funduscopic exam is normal with sharp disc margins noted. He is status post bilateral cataract repairs. Extraocular tracking is fair and actually improved from last time with no nystagmus seen. Hearing is grossly intact. Face is symmetric with normal facial animation and normal facial sensation. Speech is very mildly hypophonic and mildly hoarse. His speech is not consistent with spasmodic dysphonia. There is no lip, neck or jaw tremor. Neck is supple with full range of motion. There are no carotid bruits on auscultation. Oropharynx exam reveals moderate mouth dryness. No significant airway crowding is noted. Mallampati is class II. Tongue protrudes centrally and palate elevates symmetrically. Tongue movements are fine.   Chest: is clear to auscultation without wheezing, rhonchi or crackles noted.  Heart: sounds are regular and normal without murmurs, rubs or gallops noted.   Abdomen: is soft, non-tender and non-distended with normal bowel sounds appreciated on auscultation.  Extremities: There is no pitting edema in the distal lower extremities bilaterally, mostly around the ankles, right more so than  left and ankle joint is swollen on the right, fused, not new. Pedal pulses are intact.  Skin: is warm and dry with no trophic changes noted.  Musculoskeletal: exam reveals R hip pain and decreasing range of motion in both shoulders, left worse than right.    Neurologically:  Mental status: The patient is awake, paying attention. He is fully oriented, and able to give a fairly good history. His memory, attention, language and knowledge are fairly well preserved, but he needs help with chronological  relationship history by his family. He does seem to have some mild word finding difficulties.    Cranial nerves are as described above under HEENT exam. Motor exam: Normal bulk, and strength is globally 4+/5. Tone seems normal. There is no drift, or tremor, no rebound. Reflexes are 1+ throughout. Fine motor skills are mildly impaired, bilaterally. On finger to nose testing he has very mild dysmetria bilaterally, right more than left with left almost normal. He has no significant intention tremor. There is no truncal ataxia.  Sensory exam is intact to light touch. Gait, station and balance: He stands up with mild difficulty and only requires one attempt. His stance is mildly wide-based.  He walks without assistance and slightly wide-based. He is unable to do tandem walk.   Assessment and Plan:   In summary, MATTEUS MCNELLY is a very pleasant 68 year old male with a complex medical history of hypertension, osteoarthritis, diverticulosis, hemachromatosis, coronary artery disease, allergic rhinitis, rheumatoid arthritis, and COPD, who presents for a sooner than scheduled appointment for new onset headaches, which started in March. We repeated his brain MRI which showed no significant changes from March 2014 and I explained the findings of white matter changes and atrophy to him as well as his girlfriend. His exam appears to be stable. He does have evidence of mild ataxia but overall has come a long way. He has had multiple admissions and ER visits until March of this year. He has done well with speech therapy and recently also saw ENT and does not need to go back. Workup in the past has included spinal fluid testing which showed increased protein, increased IgG index and positive OCB.  He had altered mental status which overall improved. Unfortunately he had significant issues in the interim with complications from lower back surgery including wound infection and a protracted course clinically. Paraneoplastic  testing and screening for cancer in the past was negative, EEG normal, negative chest x-ray and negative CT abdomen and pelvis for any mass. He has been on Humira, which was restarted and he is on prednisone 10 mg daily. MRI from March 2014 and repeat brain MRI with and without contrast from 2 days ago did not show any demyelinating process or mass or enhancing lesion. His headache presentation and history is mostly in keeping with migrainous headaches. While it is unusual to present with migraines at this stage in life it is not unheard of. It may be in conjunction with tension-type headaches. This may be a mixed headache. Nevertheless, he has had significant improvement with IV Depacon recently. To that end, I have asked him to start Depakote ER once daily 250 mg strength. After 1 week he can increase it to 500 mg daily at night. We talked about potential side effects including sedation, confusion, balance issues. His liver function was fine last month. I advised him that if he feels 250 mg only at night is helpful enough he can stay on the lower dose. I will  see him back in October. He is encouraged to call with any interim questions, concerns or problems. They were in agreement.

## 2013-09-20 NOTE — Patient Instructions (Signed)
Please remember, common headache triggers are: sleep deprivation, dehydration, overheating, stress, hypoglycemia or skipping meals and blood sugar fluctuations, excessive pain medications or excessive alcohol use or caffeine withdrawal. Some people have food triggers such as aged cheese, orange juice or chocolate, especially dark chocolate, or MSG (monosodium glutamate). Try to avoid these headache triggers as much possible. It may be helpful to keep a headache diary to figure out what makes your headaches worse or brings them on and what alleviates them. Some people report headache onset after exercise but studies have shown that regular exercise may actually prevent headaches from coming. If you have exercise-induced headaches, please make sure that you drink plenty of fluid before and after exercising and that you do not over do it and do not overheat.  We will start Depakote ER 250 mg: take one pill each bedtime for 1 week, then 2 pills each night thereafter. Side effects include sedation or sleepiness, confusion, off balance.   For an acute headache, try as needed aspirin 325 mg or excedrin (which also contains aspirin)

## 2013-09-27 ENCOUNTER — Other Ambulatory Visit: Payer: Self-pay | Admitting: *Deleted

## 2013-09-27 MED ORDER — PANTOPRAZOLE SODIUM 20 MG PO TBEC: 40.0000 mg | DELAYED_RELEASE_TABLET | Freq: Two times a day (BID) | ORAL | Status: DC

## 2013-09-27 NOTE — Progress Notes (Signed)
Quick Note:  I gave results of MRI to pt. (no change from previous study). Pt verbalized understanding. He stated has had headache the last 2 days. Is taking medication prescribed. I relayed that Dr. Rexene Alberts out of office today. Will send message, He will be out of town tomorrow am, if still with headache when comes back will call us back. ______

## 2013-09-27 NOTE — Telephone Encounter (Signed)
Left msg on triage requesting refill on his pantoprazole. Called pt back verified pharmacy sent [facility].../[facility]mb

## 2013-09-28 ENCOUNTER — Encounter: Payer: Medicare Other | Admitting: Speech Pathology

## 2013-09-30 ENCOUNTER — Encounter: Payer: Medicare Other | Admitting: Speech Pathology

## 2013-10-04 ENCOUNTER — Telehealth: Payer: Self-pay | Admitting: *Deleted

## 2013-10-04 NOTE — Telephone Encounter (Signed)
Pt's son left vm stating pt is dizzy, can't walk or stand with unsteady gait. He states he sleeps all the time. [name] thinks some of his meds are affecting him and he doesn't need to be on all of them. I advised OV with PCP and transferred to scheduler. The son wants it noted that he should not be prescribed narcotics.

## 2013-10-05 NOTE — Telephone Encounter (Signed)
Noted. Thx.

## 2013-10-06 ENCOUNTER — Ambulatory Visit: Payer: Medicare Other | Admitting: Internal Medicine

## 2013-10-07 ENCOUNTER — Ambulatory Visit (INDEPENDENT_AMBULATORY_CARE_PROVIDER_SITE_OTHER): Payer: Medicare Other | Admitting: Internal Medicine

## 2013-10-07 ENCOUNTER — Telehealth: Payer: Self-pay | Admitting: *Deleted

## 2013-10-07 ENCOUNTER — Encounter: Payer: Self-pay | Admitting: Internal Medicine

## 2013-10-07 VITALS — BP 120/68 | HR 72 | Temp 98.1°F | Resp 16 | Wt 150.0 lb

## 2013-10-07 DIAGNOSIS — Z9181 History of falling: Secondary | ICD-10-CM

## 2013-10-07 DIAGNOSIS — R296 Repeated falls: Secondary | ICD-10-CM

## 2013-10-07 DIAGNOSIS — M069 Rheumatoid arthritis, unspecified: Secondary | ICD-10-CM

## 2013-10-07 DIAGNOSIS — G44209 Tension-type headache, unspecified, not intractable: Secondary | ICD-10-CM

## 2013-10-07 MED ORDER — GABAPENTIN 300 MG PO CAPS: 300.0000 mg | ORAL_CAPSULE | Freq: Three times a day (TID) | ORAL | Status: DC

## 2013-10-07 MED ORDER — LOSARTAN POTASSIUM-HCTZ 50-12.5 MG PO TABS: 1.0000 | ORAL_TABLET | Freq: Every day | ORAL | Status: DC

## 2013-10-07 MED ORDER — SUMATRIPTAN SUCCINATE 100 MG PO TABS
ORAL_TABLET | ORAL | Status: DC
Start: 2013-10-07 — End: 2013-10-29

## 2013-10-07 NOTE — Assessment & Plan Note (Signed)
Start PT Reduce meds

## 2013-10-07 NOTE — Patient Instructions (Signed)
Stop Daliresp Hold Simvastatin x 1-2 weeks if mor pains Losartan HCT 50-12.5 in place of Losartan 50 and hydrodiuril  Try Depakote 1 after dinner (in place of 2)

## 2013-10-07 NOTE — Assessment & Plan Note (Signed)
Continue with current prescription therapy as reflected on the Med list. Start PT

## 2013-10-07 NOTE — Progress Notes (Signed)
Pre visit review using our clinic review tool, if applicable. No additional management support is needed unless otherwise documented below in the visit note. 

## 2013-10-07 NOTE — Assessment & Plan Note (Signed)
Depakote helped F/u w/Neurol - Dr Rexene Alberts Reduce meds

## 2013-10-07 NOTE — Telephone Encounter (Signed)
Error

## 2013-10-07 NOTE — Progress Notes (Signed)
Subjective:    HPI    Wt Readings from Last 3 Encounters:  10/07/13 150 lb (68.04 kg)  09/20/13 151 lb (68.493 kg)  08/10/13 145 lb (65.772 kg)     BP Readings from Last 3 Encounters:  10/07/13 120/68  09/20/13 143/77  09/08/13 126/82   Pt's son left vm stating pt is dizzy, can't walk or stand with unsteady gait. He states he sleeps all the time. [name] thinks some of his meds are affecting him and he doesn't need to be on all of them  S/p recent LS surgery, sepsis, rehab - weak muscles....  [name] presents for general medical follow up, accompanied by his son and daughter-in-law. He has had a very difficult time with multiple medical problems following back surgery - all well documented in the hospital record and previous notes. Since his last hospital d/c he has been living with his son. By their report he is off narcotics!! He is still having quite a bit of pain but is managing. He has lost a lot of weight but is currently on a good diet plus calorie supplement. Since being home he is using a rolling walker and working on his recovery of strength and mobility.  He does report persistent Headache in a frontal-vertex/occipital distribution x 1 year. On questioning his description favors crushing/pressure like HA. No associated neurologic symptoms. He saw Dr Rexene Alberts   Past Medical History   Diagnosis  Date   .  Hx of colonic polyps    .  Diverticulosis    .  Hypertension    .  Hemochromatosis      dx'd 14 yrs ago last ferritin Aug 11, 08 52 (22-322), Fe 136   .  CAD (coronary artery disease)      minimal coronary plaque in the LAD and right coronary system. 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 7-06. Last nuclear stress 11-17-06 fixed anterior/ inferior defect, no inducible ischemia, EF 81%   .  Allergic rhinitis    .  Hx of colonoscopy    .  Dysrhythmia  01-24-12     past hx. A.Fib x1 episode-responded  to med.   .  Depression    .  GERD (gastroesophageal reflux disease)    .  COPD (chronic obstructive pulmonary disease)      pt denies this hx on 05/24/2013   .  Osteoarthritis    .  RA (rheumatoid arthritis)    .  Chronic back pain      "all over back" (06/16/2013)   .  PONV (postoperative nausea and vomiting)    .  High cholesterol    .  Sleep apnea      "never dx'd; snores bad" (06/16/2013)   .  Daily headache    .  Falls frequently      "since 02/2013" (06/16/2013)   .  Anxiety    .  Narcotic abuse      per family   .  Alcoholism /alcohol abuse      per family   .  Dementia     Past Surgical History   Procedure  Laterality  Date   .  Appendectomy     .  Total knee arthroplasty  Right  2002     partial   .  Shoulder hemi-arthroplasty  Left  2008     partial   .  Foot surgery  Right  11-08  for removal of bone spurs-   .  Ankle reconstruction  Right  6-09     [facility]   .  Posterior lumbar fusion   12-10     L4-5 diskectomy w/ fusion, cage placement and rods; [facility]   .  Partial knee arthroplasty  Left    .  Total shoulder replacement  Right    .  Joint replacement     .  Cataract extraction, bilateral  Bilateral  01-24-12   .  Orif shoulder fracture   02/06/2012     Procedure: OPEN REDUCTION INTERNAL FIXATION (ORIF) SHOULDER FRACTURE; Surgeon: Nita Sells, MD; Location: [facility]; Service: Orthopedics; Laterality: Left; ORIF of a Left Shoulder Fracture with Iliac Crest Bone Graft aspiration   .  Harvest bone graft   02/06/2012     Procedure: HARVEST ILIAC BONE GRAFT; Surgeon: Nita Sells, MD; Location: [facility]; Service: Orthopedics;; bone marrow aspirqation   .  Hardware removal   03/09/2012     Procedure: HARDWARE REMOVAL; Surgeon: Nita Sells, MD; Location: [place]; Service: Orthopedics; Laterality: Left; Hardware Removal from Left Shoulder   .  Shoulder arthroscopy  Right  2013   .  Carpal tunnel release  Right  1990's     .  [facility] ankle replacement  Right  2008     at [facility]   .  Hammer toe surgery  Right  07/2012     "broke 4 hammertoes"   .  Posterior lumbar fusion 4 level  N/A  03/02/2013     Procedure: Lumbar One to Sacral One Posterior lumbar interbody fusion; Surgeon: Floyce Stakes, MD; Location: [place] [facility]; Service: Neurosurgery; Laterality: N/A; L1 to S1 Posterior lumbar interbody fusion   .  Lumbar wound debridement  N/A  03/17/2013     Procedure: Incision and drainage of superficial lumbar wound; Surgeon: Floyce Stakes, MD; Location: [place] [facility]; Service: Neurosurgery; Laterality: N/A; Incision and drainage of superficial lumbar wound   .  Tonsillectomy     .  Coronary angioplasty with stent placement   2006     "while repairing 1st stent, a second area tore and they had to place 2nd stent " ?LAD & CX   .  Inguinal hernia repair  Bilateral     Family History   Problem  Relation  Age of Onset   .  Uterine cancer  Mother      survivor   .  Macular degeneration  Mother    .  Other  Mother      ankle edema   .  Lung cancer  Mother    .  Cancer  Mother      ovarian, lung   .  Coronary artery disease  Father    .  Hypertension  Father    .  Prostate cancer  Father    .  Colon polyps  Father    .  Cancer  Father      prostate   .  Thyroid disease  Sister    .  Heart attack  Brother    .  Hyperlipidemia  Brother    .  Other  Brother      Schizophrenic   .  Diabetes  Neg Hx    .  Colon cancer  Neg Hx    .  Esophageal cancer  Neg Hx    .  Rectal cancer  Neg Hx    .  Stomach cancer  Neg Hx    .  Coronary artery disease  Maternal Aunt    .  Heart attack  Maternal Aunt    .  Rheum arthritis  Sister    .  Hemochromatosis  Sister     History    Social History   .  Marital Status:  Divorced     Spouse Name:  N/A     Number of Children:  2   .  Years of Education:  16    Occupational History   .  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  Smoking  status:  Never Smoker   .  Smokeless tobacco:  Never Used   .  Alcohol Use:  0.0 oz/week      Comment: 06/16/2013 "used to drink alot; was pretty bad about it; don't think he's had any alcohol in the past year"   .  Drug Use:  No   .  Sexual Activity:  Yes     Partners:  Female    Other Topics  Concern   .  Not on file    Social History Narrative    [employer] [place]. Married 1972-39yr seperated/divorced. Engaged April '11. 1 son '74; step-daughter '70. 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 During '11 discovered an [employer]- $500,000+ loss. Is handling this OK - will keep the business running.    Current Outpatient Prescriptions on File Prior to Visit   Medication  Sig  Dispense  Refill   .  Aclidinium Bromide (TUDORZA PRESSAIR) 400 MCG/ACT AEPB  Inhale 1 puff into the lungs 2 (two) times daily.  1 each  11   .  aspirin (ASPIRIN EC) 81 MG EC tablet  Take 1 tablet (81 mg total) by mouth every morning.  30 tablet  12   .  cholecalciferol (VITAMIN D) 1000 UNITS tablet  Take 1 tablet (1,000 Units total) by mouth daily.  100 tablet  3   .  FLUoxetine (PROZAC) 20 MG capsule  Take 40 mg by mouth daily.     .  fluticasone (FLONASE) 50 MCG/ACT nasal spray  Place 2 sprays into both nostrils at bedtime.     .  gabapentin (NEURONTIN) 300 MG capsule  Take 2 capsules (600 mg total) by mouth 2 (two) times daily.  60 capsule  1   .  losartan (COZAAR) 50 MG tablet  Take 1 tablet (50 mg total) by mouth daily.  30 tablet  1   .  metoprolol tartrate (LOPRESSOR) 25 MG tablet  Take 1 tablet (25 mg total) by mouth 2 (two) times daily.  60 tablet  1   .  Polyethyl Glycol-Propyl Glycol (SYSTANE ULTRA) 0.4-0.3 % SOLN  Place 2 drops into both eyes 2 (two) times daily as needed (dry eyes).     .  predniSONE (DELTASONE) 5 MG tablet  Take 1 tablet (5 mg total) by mouth every evening.  30 tablet  1    .  promethazine (PHENERGAN) 25 MG tablet  Take 1 tablet (25 mg total) by mouth every 6 (six) hours as needed for nausea or vomiting.  20 tablet  0   .  simvastatin (ZOCOR) 40 MG tablet  Take 1 tablet (40 mg total) by mouth daily with lunch.  30 tablet  1   .  docusate sodium (COLACE) 100 MG capsule  Take 1 capsule (100 mg total) by mouth 2 (two) times daily.  60 capsule  0    No current facility-administered medications on file prior to visit.       Review of Systems  Constitutional: Negative for appetite change, fatigue and unexpected weight change.  HENT: Positive for voice change. Negative for congestion, nosebleeds, sneezing, sore throat and trouble swallowing.   Eyes: Negative for itching and visual disturbance.  Respiratory: Negative for cough.   Cardiovascular: Negative for chest pain, palpitations and leg swelling.  Gastrointestinal: Negative for nausea, vomiting, diarrhea, blood in stool and abdominal distention.  Genitourinary: Negative for frequency and hematuria.  Musculoskeletal: Positive for arthralgias, back pain, gait problem, neck pain and neck stiffness. Negative for joint swelling.  Skin: Negative for rash and wound.  Neurological: Negative for dizziness, tremors, speech difficulty, weakness, light-headedness and headaches.  Psychiatric/Behavioral: Positive for sleep disturbance. Negative for suicidal ideas, dysphoric mood and agitation. The patient is not nervous/anxious.        Objective:   Physical Exam  Constitutional: He is oriented to person, place, and time. He appears well-developed. No distress.  NAD  HENT:  Mouth/Throat: Oropharynx is clear and moist.  hoarse  Eyes: Conjunctivae are normal. Pupils are equal, round, and reactive to light.  Neck: Normal range of motion. No JVD present. No thyromegaly present.  Cardiovascular: Normal rate, regular rhythm, normal heart sounds and intact distal pulses.  Exam reveals no gallop and no friction rub.   No  murmur heard. Pulmonary/Chest: Effort normal and breath sounds normal. No respiratory distress. He has no wheezes. He has no rales. He exhibits no tenderness.  Abdominal: Soft. Bowel sounds are normal. He exhibits no distension and no mass. There is no tenderness. There is no rebound and no guarding.  Musculoskeletal: Normal range of motion. He exhibits no edema and no tenderness.  Lymphadenopathy:    He has no cervical adenopathy.  Neurological: He is alert and oriented to person, place, and time. He has normal reflexes. No cranial nerve deficit. He exhibits normal muscle tone. He displays a negative Romberg sign. Coordination and gait normal.  No meningeal signs Cane LS is tender  Skin: Skin is warm and dry. No rash noted.  Psychiatric: He has a normal mood and affect. His behavior is normal. Judgment and thought content normal.     Lab Results  Component Value Date   WBC 11.1* 08/10/2013   HGB 14.8 08/10/2013   HCT 43.6 08/10/2013   PLT 307.0 08/10/2013   GLUCOSE 68* 08/10/2013   CHOL 100 10/15/2010   TRIG 75.0 10/15/2010   HDL 36.70* 10/15/2010   LDLCALC 48 10/15/2010   ALT 22 08/10/2013   AST 18 08/10/2013   NA 138 08/10/2013   K 4.8 08/10/2013   CL 101 08/10/2013   CREATININE 0.7 08/10/2013   BUN 16 08/10/2013   CO2 31 08/10/2013   TSH 0.220* 04/13/2013   PSA 1.95 10/15/2010   INR 1.26 06/08/2013   HGBA1C 5.1 03/21/2013        Assessment & Plan:

## 2013-10-13 ENCOUNTER — Other Ambulatory Visit: Payer: Self-pay | Admitting: *Deleted

## 2013-10-13 MED ORDER — POTASSIUM CHLORIDE CRYS ER 20 MEQ PO TBCR: 20.0000 meq | EXTENDED_RELEASE_TABLET | Freq: Two times a day (BID) | ORAL | Status: DC

## 2013-10-15 ENCOUNTER — Other Ambulatory Visit: Payer: Self-pay | Admitting: *Deleted

## 2013-10-22 ENCOUNTER — Encounter: Payer: Self-pay | Admitting: Internal Medicine

## 2013-10-22 ENCOUNTER — Ambulatory Visit (INDEPENDENT_AMBULATORY_CARE_PROVIDER_SITE_OTHER): Payer: Medicare Other | Admitting: Internal Medicine

## 2013-10-22 ENCOUNTER — Other Ambulatory Visit (INDEPENDENT_AMBULATORY_CARE_PROVIDER_SITE_OTHER): Payer: Medicare Other

## 2013-10-22 VITALS — BP 128/78 | HR 80 | Temp 97.9°F | Resp 16 | Wt 157.0 lb

## 2013-10-22 DIAGNOSIS — G44209 Tension-type headache, unspecified, not intractable: Secondary | ICD-10-CM

## 2013-10-22 DIAGNOSIS — R5383 Other fatigue: Secondary | ICD-10-CM

## 2013-10-22 DIAGNOSIS — Z9181 History of falling: Secondary | ICD-10-CM

## 2013-10-22 DIAGNOSIS — R296 Repeated falls: Secondary | ICD-10-CM

## 2013-10-22 DIAGNOSIS — R5381 Other malaise: Secondary | ICD-10-CM

## 2013-10-22 LAB — HEPATIC FUNCTION PANEL
ALT: 10 U/L (ref 0–53)
AST: 11 U/L (ref 0–37)
Albumin: 3.5 g/dL (ref 3.5–5.2)
Alkaline Phosphatase: 49 U/L (ref 39–117)
Bilirubin, Direct: 0.1 mg/dL (ref 0.0–0.3)
Total Bilirubin: 0.6 mg/dL (ref 0.2–1.2)
Total Protein: 6.3 g/dL (ref 6.0–8.3)

## 2013-10-22 LAB — CBC WITH DIFFERENTIAL/PLATELET
Basophils Absolute: 0 10*3/uL (ref 0.0–0.1)
Basophils Relative: 0.3 % (ref 0.0–3.0)
Eosinophils Absolute: 0.1 10*3/uL (ref 0.0–0.7)
Eosinophils Relative: 1.2 % (ref 0.0–5.0)
HCT: 38.5 % — ABNORMAL LOW (ref 39.0–52.0)
Hemoglobin: 12.9 g/dL — ABNORMAL LOW (ref 13.0–17.0)
Lymphocytes Relative: 30.9 % (ref 12.0–46.0)
Lymphs Abs: 2.3 10*3/uL (ref 0.7–4.0)
MCHC: 33.5 g/dL (ref 30.0–36.0)
MCV: 96.3 fl (ref 78.0–100.0)
Monocytes Absolute: 0.6 10*3/uL (ref 0.1–1.0)
Monocytes Relative: 8.5 % (ref 3.0–12.0)
Neutro Abs: 4.4 10*3/uL (ref 1.4–7.7)
Neutrophils Relative %: 59.1 % (ref 43.0–77.0)
Platelets: 202 10*3/uL (ref 150.0–400.0)
RBC: 4 Mil/uL — ABNORMAL LOW (ref 4.22–5.81)
RDW: 13.4 % (ref 11.5–15.5)
WBC: 7.5 10*3/uL (ref 4.0–10.5)

## 2013-10-22 LAB — BASIC METABOLIC PANEL
BUN: 20 mg/dL (ref 6–23)
CO2: 32 mEq/L (ref 19–32)
Calcium: 8.8 mg/dL (ref 8.4–10.5)
Chloride: 102 mEq/L (ref 96–112)
Creatinine, Ser: 0.7 mg/dL (ref 0.4–1.5)
GFR: 119.74 mL/min (ref >60.00)
Glucose, Bld: 83 mg/dL (ref 70–99)
Potassium: 4.2 mEq/L (ref 3.5–5.1)
Sodium: 139 mEq/L (ref 135–145)

## 2013-10-22 LAB — T4, FREE: Free T4: 1.21 ng/dL (ref 0.60–1.60)

## 2013-10-22 NOTE — Assessment & Plan Note (Signed)
Better  Reduce Depakote to 1 tab at night to see if you are less sleepy. Reduce Gabapentin: remove am dose

## 2013-10-22 NOTE — Progress Notes (Signed)
Subjective:    HPI  C/o fatigue, sleeping a lot, HAs. He came back from vacation.  He saw Dr Rosalva Ferron Readings from Last 3 Encounters:  10/22/13 157 lb (71.215 kg)  10/07/13 150 lb (68.04 kg)  09/20/13 151 lb (68.493 kg)     BP Readings from Last 3 Encounters:  10/22/13 128/78  10/07/13 120/68  09/20/13 143/77   (Pt's son left vm 7/3  stating pt is dizzy, can't walk or stand with unsteady gait. He states he sleeps all the time. [name] thinks some of his meds are affecting him and he doesn't need to be on all of them)  S/p recent LS surgery, sepsis, rehab - weak muscles....  [name] presents for general medical follow up, accompanied by his son and daughter-in-law. He has had a very difficult time with multiple medical problems following back surgery - all well documented in the hospital record and previous notes. Since his last hospital d/c he has been living with his son. By their report he is off narcotics!! He is still having quite a bit of pain but is managing. He has lost a lot of weight but is currently on a good diet plus calorie supplement. Since being home he is using a rolling walker and working on his recovery of strength and mobility.  He does report persistent Headache in a frontal-vertex/occipital distribution x 1 year. On questioning his description favors crushing/pressure like HA. No associated neurologic symptoms. He saw Dr Rexene Alberts   Past Medical History   Diagnosis  Date   .  Hx of colonic polyps    .  Diverticulosis    .  Hypertension    .  Hemochromatosis      dx'd 14 yrs ago last ferritin Aug 11, 08 52 (22-322), Fe 136   .  CAD (coronary artery disease)      minimal coronary plaque in the LAD and right coronary system. 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 7-06. Last nuclear stress 11-17-06 fixed anterior/ inferior defect, no inducible ischemia, EF 81%   .  Allergic rhinitis    .   Hx of colonoscopy    .  Dysrhythmia  01-24-12     past hx. A.Fib x1 episode-responded to med.   .  Depression    .  GERD (gastroesophageal reflux disease)    .  COPD (chronic obstructive pulmonary disease)      pt denies this hx on 05/24/2013   .  Osteoarthritis    .  RA (rheumatoid arthritis)    .  Chronic back pain      "all over back" (06/16/2013)   .  PONV (postoperative nausea and vomiting)    .  High cholesterol    .  Sleep apnea      "never dx'd; snores bad" (06/16/2013)   .  Daily headache    .  Falls frequently      "since 02/2013" (06/16/2013)   .  Anxiety    .  Narcotic abuse      per family   .  Alcoholism /alcohol abuse      per family   .  Dementia     Past Surgical History   Procedure  Laterality  Date   .  Appendectomy     .  Total knee arthroplasty  Right  2002     partial   .  Shoulder hemi-arthroplasty  Left  2008     partial   .  Foot surgery  Right  11-08     for removal of bone spurs-   .  Ankle reconstruction  Right  6-09     [facility]   .  Posterior lumbar fusion   12-10     L4-5 diskectomy w/ fusion, cage placement and rods; [facility]   .  Partial knee arthroplasty  Left    .  Total shoulder replacement  Right    .  Joint replacement     .  Cataract extraction, bilateral  Bilateral  01-24-12   .  Orif shoulder fracture   02/06/2012     Procedure: OPEN REDUCTION INTERNAL FIXATION (ORIF) SHOULDER FRACTURE; Surgeon: Nita Sells, MD; [facility]; Service: Orthopedics; Laterality: Left; ORIF of a Left Shoulder Fracture with Iliac Crest Bone Graft aspiration   .  Harvest bone graft   02/06/2012     Procedure: HARVEST ILIAC BONE GRAFT; Surgeon: Nita Sells, MD; [facility]: [facility]; Service: Orthopedics;; bone marrow aspirqation   .  Hardware removal   03/09/2012     Procedure: HARDWARE REMOVAL; Surgeon: Nita Sells, MD; Location: [place]; Service: Orthopedics; Laterality: Left; Hardware Removal from Left  Shoulder   .  Shoulder arthroscopy  Right  2013   .  Carpal tunnel release  Right  1990's   .  Total ankle replacement  Right  2008     at [facility]   .  Hammer toe surgery  Right  07/2012     "broke 4 hammertoes"   .  Posterior lumbar fusion 4 level  N/A  03/02/2013     Procedure: Lumbar One to Sacral One Posterior lumbar interbody fusion; Surgeon: Floyce Stakes, MD; Location: [place] [facility]; Service: Neurosurgery; Laterality: N/A; L1 to S1 Posterior lumbar interbody fusion   .  Lumbar wound debridement  N/A  03/17/2013     Procedure: Incision and drainage of superficial lumbar wound; Surgeon: Floyce Stakes, MD; Location: [place] [facility]; Service: Neurosurgery; Laterality: N/A; Incision and drainage of superficial lumbar wound   .  Tonsillectomy     .  Coronary angioplasty with stent placement   2006     "while repairing 1st stent, a second area tore and they had to place 2nd stent " ?LAD & CX   .  Inguinal hernia repair  Bilateral     Family History   Problem  Relation  Age of Onset   .  Uterine cancer  Mother      survivor   .  Macular degeneration  Mother    .  Other  Mother      ankle edema   .  Lung cancer  Mother    .  Cancer  Mother      ovarian, lung   .  Coronary artery disease  Father    .  Hypertension  Father    .  Prostate cancer  Father    .  Colon polyps  Father    .  Cancer  Father      prostate   .  Thyroid disease  Sister    .  Heart attack  Brother    .  Hyperlipidemia  Brother    .  Other  Brother      Schizophrenic   .  Diabetes  Neg Hx    .  Colon cancer  Neg Hx    .  Esophageal  cancer  Neg Hx    .  Rectal cancer  Neg Hx    .  Stomach cancer  Neg Hx    .  Coronary artery disease  Maternal Aunt    .  Heart attack  Maternal Aunt    .  Rheum arthritis  Sister    .  Hemochromatosis  Sister     History    Social History   .  Marital Status:  Divorced     Spouse Name:  N/A     Number of Children:  2   .  Years of Education:  16    Occupational  History   .  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  Smoking status:  Never Smoker   .  Smokeless tobacco:  Never Used   .  Alcohol Use:  0.0 oz/week      Comment: 06/16/2013 "used to drink alot; was pretty bad about it; don't think he's had any alcohol in the past year"   .  Drug Use:  No   .  Sexual Activity:  Yes     Partners:  Female    Other Topics  Concern   .  Not on file    Social History Narrative    [employer] [place]. Married 1972-68yr seperated/divorced. Engaged April '11. 1 son '74; step-daughter '70. 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 During '11 discovered an [employer]- $500,000+ loss. Is handling this OK - will keep the business running.    Current Outpatient Prescriptions on File Prior to Visit   Medication  Sig  Dispense  Refill   .  Aclidinium Bromide (TUDORZA PRESSAIR) 400 MCG/ACT AEPB  Inhale 1 puff into the lungs 2 (two) times daily.  1 each  11   .  aspirin (ASPIRIN EC) 81 MG EC tablet  Take 1 tablet (81 mg total) by mouth every morning.  30 tablet  12   .  cholecalciferol (VITAMIN D) 1000 UNITS tablet  Take 1 tablet (1,000 Units total) by mouth daily.  100 tablet  3   .  FLUoxetine (PROZAC) 20 MG capsule  Take 40 mg by mouth daily.     .  fluticasone (FLONASE) 50 MCG/ACT nasal spray  Place 2 sprays into both nostrils at bedtime.     .  gabapentin (NEURONTIN) 300 MG capsule  Take 2 capsules (600 mg total) by mouth 2 (two) times daily.  60 capsule  1   .  losartan (COZAAR) 50 MG tablet  Take 1 tablet (50 mg total) by mouth daily.  30 tablet  1   .  metoprolol tartrate (LOPRESSOR) 25 MG tablet  Take 1 tablet (25 mg total) by mouth 2 (two) times daily.  60 tablet  1   .  Polyethyl Glycol-Propyl Glycol (SYSTANE ULTRA) 0.4-0.3 % SOLN  Place 2 drops into both eyes 2 (two) times daily as needed (dry eyes).     .   predniSONE (DELTASONE) 5 MG tablet  Take 1 tablet (5 mg total) by mouth every evening.  30 tablet  1   .  promethazine (PHENERGAN) 25 MG tablet  Take 1 tablet (25 mg total) by mouth every 6 (six) hours as needed for nausea or vomiting.  20 tablet  0   .  simvastatin (ZOCOR) 40 MG  tablet  Take 1 tablet (40 mg total) by mouth daily with lunch.  30 tablet  1   .  docusate sodium (COLACE) 100 MG capsule  Take 1 capsule (100 mg total) by mouth 2 (two) times daily.  60 capsule  0    No current facility-administered medications on file prior to visit.       Review of Systems  Constitutional: Negative for appetite change, fatigue and unexpected weight change.  HENT: Positive for voice change. Negative for congestion, nosebleeds, sneezing, sore throat and trouble swallowing.   Eyes: Negative for itching and visual disturbance.  Respiratory: Negative for cough.   Cardiovascular: Negative for chest pain, palpitations and leg swelling.  Gastrointestinal: Negative for nausea, vomiting, diarrhea, blood in stool and abdominal distention.  Genitourinary: Negative for frequency and hematuria.  Musculoskeletal: Positive for arthralgias, back pain, gait problem, neck pain and neck stiffness. Negative for joint swelling.  Skin: Negative for rash and wound.  Neurological: Negative for dizziness, tremors, speech difficulty, weakness, light-headedness and headaches.  Psychiatric/Behavioral: Positive for sleep disturbance. Negative for suicidal ideas, dysphoric mood and agitation. The patient is not nervous/anxious.        Objective:   Physical Exam  Constitutional: He is oriented to person, place, and time. He appears well-developed. No distress.  NAD  HENT:  Mouth/Throat: Oropharynx is clear and moist.  hoarse  Eyes: Conjunctivae are normal. Pupils are equal, round, and reactive to light.  Neck: Normal range of motion. No JVD present. No thyromegaly present.  Cardiovascular: Normal rate, regular rhythm,  normal heart sounds and intact distal pulses.  Exam reveals no gallop and no friction rub.   No murmur heard. Pulmonary/Chest: Effort normal and breath sounds normal. No respiratory distress. He has no wheezes. He has no rales. He exhibits no tenderness.  Abdominal: Soft. Bowel sounds are normal. He exhibits no distension and no mass. There is no tenderness. There is no rebound and no guarding.  Musculoskeletal: Normal range of motion. He exhibits no edema and no tenderness.  Lymphadenopathy:    He has no cervical adenopathy.  Neurological: He is alert and oriented to person, place, and time. He has normal reflexes. No cranial nerve deficit. He exhibits normal muscle tone. He displays a negative Romberg sign. Coordination and gait normal.  No meningeal signs Cane LS is tender  Skin: Skin is warm and dry. No rash noted.  Psychiatric: He has a normal mood and affect. His behavior is normal. Judgment and thought content normal.  Looks well   Lab Results  Component Value Date   WBC 11.1* 08/10/2013   HGB 14.8 08/10/2013   HCT 43.6 08/10/2013   PLT 307.0 08/10/2013   GLUCOSE 68* 08/10/2013   CHOL 100 10/15/2010   TRIG 75.0 10/15/2010   HDL 36.70* 10/15/2010   LDLCALC 48 10/15/2010   ALT 22 08/10/2013   AST 18 08/10/2013   NA 138 08/10/2013   K 4.8 08/10/2013   CL 101 08/10/2013   CREATININE 0.7 08/10/2013   BUN 16 08/10/2013   CO2 31 08/10/2013   TSH 0.220* 04/13/2013   PSA 1.95 10/15/2010   INR 1.26 06/08/2013   HGBA1C 5.1 03/21/2013        Assessment & Plan:

## 2013-10-22 NOTE — Assessment & Plan Note (Signed)
Reduce Depakote to 1 tab at night to see if you are less sleepy. Reduce Gabapentin: remove am dose Labs

## 2013-10-22 NOTE — Assessment & Plan Note (Signed)
Better  

## 2013-10-22 NOTE — Patient Instructions (Signed)
Reduce Depakote to 1 tab at night to see if you are less sleepy. Reduce Gabapentin: remove am dose

## 2013-10-22 NOTE — Progress Notes (Signed)
Pre visit review using our clinic review tool, if applicable. No additional management support is needed unless otherwise documented below in the visit note. 

## 2013-10-23 LAB — VALPROIC ACID LEVEL: Valproic Acid Lvl: 29.9 ug/mL — ABNORMAL LOW (ref 50.0–100.0)

## 2013-10-26 ENCOUNTER — Ambulatory Visit: Payer: Medicare Other | Attending: Internal Medicine | Admitting: Physical Therapy

## 2013-10-26 DIAGNOSIS — R1313 Dysphagia, pharyngeal phase: Secondary | ICD-10-CM | POA: Diagnosis not present

## 2013-10-26 DIAGNOSIS — R490 Dysphonia: Secondary | ICD-10-CM | POA: Diagnosis not present

## 2013-10-26 DIAGNOSIS — IMO0001 Reserved for inherently not codable concepts without codable children: Secondary | ICD-10-CM | POA: Insufficient documentation

## 2013-10-29 ENCOUNTER — Telehealth: Payer: Self-pay | Admitting: *Deleted

## 2013-10-29 ENCOUNTER — Emergency Department (HOSPITAL_COMMUNITY): Payer: Medicare Other

## 2013-10-29 ENCOUNTER — Encounter (HOSPITAL_COMMUNITY): Payer: Self-pay | Admitting: Emergency Medicine

## 2013-10-29 ENCOUNTER — Ambulatory Visit: Payer: Medicare Other | Admitting: Physical Therapy

## 2013-10-29 ENCOUNTER — Observation Stay (HOSPITAL_COMMUNITY)
Admission: EM | Admit: 2013-10-29 | Discharge: 2013-11-01 | Disposition: A | Discharge status: Home / Self Care | Payer: Medicare Other | Attending: Internal Medicine | Admitting: Internal Medicine

## 2013-10-29 ENCOUNTER — Telehealth: Payer: Self-pay | Admitting: Internal Medicine

## 2013-10-29 DIAGNOSIS — Z96619 Presence of unspecified artificial shoulder joint: Secondary | ICD-10-CM | POA: Diagnosis not present

## 2013-10-29 DIAGNOSIS — R51 Headache: Principal | ICD-10-CM | POA: Insufficient documentation

## 2013-10-29 DIAGNOSIS — Z7982 Long term (current) use of aspirin: Secondary | ICD-10-CM | POA: Diagnosis not present

## 2013-10-29 DIAGNOSIS — Z96659 Presence of unspecified artificial knee joint: Secondary | ICD-10-CM | POA: Diagnosis not present

## 2013-10-29 DIAGNOSIS — J4489 Other specified chronic obstructive pulmonary disease: Secondary | ICD-10-CM | POA: Insufficient documentation

## 2013-10-29 DIAGNOSIS — G894 Chronic pain syndrome: Secondary | ICD-10-CM

## 2013-10-29 DIAGNOSIS — M069 Rheumatoid arthritis, unspecified: Secondary | ICD-10-CM | POA: Diagnosis not present

## 2013-10-29 DIAGNOSIS — G8929 Other chronic pain: Secondary | ICD-10-CM | POA: Diagnosis not present

## 2013-10-29 DIAGNOSIS — R0609 Other forms of dyspnea: Secondary | ICD-10-CM | POA: Diagnosis not present

## 2013-10-29 DIAGNOSIS — F1020 Alcohol dependence, uncomplicated: Secondary | ICD-10-CM | POA: Diagnosis not present

## 2013-10-29 DIAGNOSIS — G479 Sleep disorder, unspecified: Secondary | ICD-10-CM

## 2013-10-29 DIAGNOSIS — M1990 Unspecified osteoarthritis, unspecified site: Secondary | ICD-10-CM | POA: Insufficient documentation

## 2013-10-29 DIAGNOSIS — Z4889 Encounter for other specified surgical aftercare: Secondary | ICD-10-CM

## 2013-10-29 DIAGNOSIS — D72829 Elevated white blood cell count, unspecified: Secondary | ICD-10-CM | POA: Diagnosis not present

## 2013-10-29 DIAGNOSIS — R509 Fever, unspecified: Secondary | ICD-10-CM

## 2013-10-29 DIAGNOSIS — J449 Chronic obstructive pulmonary disease, unspecified: Secondary | ICD-10-CM | POA: Insufficient documentation

## 2013-10-29 DIAGNOSIS — G9340 Encephalopathy, unspecified: Secondary | ICD-10-CM

## 2013-10-29 DIAGNOSIS — I2510 Atherosclerotic heart disease of native coronary artery without angina pectoris: Secondary | ICD-10-CM | POA: Insufficient documentation

## 2013-10-29 DIAGNOSIS — Z79899 Other long term (current) drug therapy: Secondary | ICD-10-CM

## 2013-10-29 DIAGNOSIS — I10 Essential (primary) hypertension: Secondary | ICD-10-CM | POA: Insufficient documentation

## 2013-10-29 DIAGNOSIS — Z9181 History of falling: Secondary | ICD-10-CM | POA: Insufficient documentation

## 2013-10-29 DIAGNOSIS — M961 Postlaminectomy syndrome, not elsewhere classified: Secondary | ICD-10-CM | POA: Diagnosis not present

## 2013-10-29 DIAGNOSIS — R634 Abnormal weight loss: Secondary | ICD-10-CM

## 2013-10-29 DIAGNOSIS — E43 Unspecified severe protein-calorie malnutrition: Secondary | ICD-10-CM

## 2013-10-29 DIAGNOSIS — R296 Repeated falls: Secondary | ICD-10-CM

## 2013-10-29 DIAGNOSIS — G44041 Chronic paroxysmal hemicrania, intractable: Secondary | ICD-10-CM

## 2013-10-29 DIAGNOSIS — F0390 Unspecified dementia without behavioral disturbance: Secondary | ICD-10-CM | POA: Diagnosis not present

## 2013-10-29 DIAGNOSIS — E785 Hyperlipidemia, unspecified: Secondary | ICD-10-CM

## 2013-10-29 DIAGNOSIS — Z860100 Personal history of colon polyps, unspecified: Secondary | ICD-10-CM | POA: Insufficient documentation

## 2013-10-29 DIAGNOSIS — R490 Dysphonia: Secondary | ICD-10-CM

## 2013-10-29 DIAGNOSIS — K219 Gastro-esophageal reflux disease without esophagitis: Secondary | ICD-10-CM | POA: Insufficient documentation

## 2013-10-29 DIAGNOSIS — D649 Anemia, unspecified: Secondary | ICD-10-CM

## 2013-10-29 DIAGNOSIS — Z9861 Coronary angioplasty status: Secondary | ICD-10-CM | POA: Insufficient documentation

## 2013-10-29 DIAGNOSIS — R0989 Other specified symptoms and signs involving the circulatory and respiratory systems: Secondary | ICD-10-CM | POA: Diagnosis not present

## 2013-10-29 DIAGNOSIS — G44209 Tension-type headache, unspecified, not intractable: Secondary | ICD-10-CM

## 2013-10-29 DIAGNOSIS — R519 Headache, unspecified: Secondary | ICD-10-CM | POA: Diagnosis present

## 2013-10-29 LAB — URINE MICROSCOPIC-ADD ON

## 2013-10-29 LAB — CBC WITH DIFFERENTIAL/PLATELET
Basophils Absolute: 0 10*3/uL (ref 0.0–0.1)
Basophils Relative: 0 % (ref 0–1)
Eosinophils Absolute: 0 10*3/uL (ref 0.0–0.7)
Eosinophils Relative: 0 % (ref 0–5)
HCT: 43.1 % (ref 39.0–52.0)
Hemoglobin: 14.9 g/dL (ref 13.0–17.0)
Lymphocytes Relative: 8 % — ABNORMAL LOW (ref 12–46)
Lymphs Abs: 0.9 10*3/uL (ref 0.7–4.0)
MCH: 32.5 pg (ref 26.0–34.0)
MCHC: 34.6 g/dL (ref 30.0–36.0)
MCV: 93.9 fL (ref 78.0–100.0)
Monocytes Absolute: 1.7 10*3/uL — ABNORMAL HIGH (ref 0.1–1.0)
Monocytes Relative: 14 % — ABNORMAL HIGH (ref 3–12)
Neutro Abs: 9 10*3/uL — ABNORMAL HIGH (ref 1.7–7.7)
Neutrophils Relative %: 78 % — ABNORMAL HIGH (ref 43–77)
Platelets: 214 10*3/uL (ref 150–400)
RBC: 4.59 MIL/uL (ref 4.22–5.81)
RDW: 13 % (ref 11.5–15.5)
WBC: 11.6 10*3/uL — ABNORMAL HIGH (ref 4.0–10.5)

## 2013-10-29 LAB — URINALYSIS, ROUTINE W REFLEX MICROSCOPIC
Glucose, UA: NEGATIVE mg/dL
Ketones, ur: 15 mg/dL — AB
Leukocytes, UA: NEGATIVE
Nitrite: NEGATIVE
Protein, ur: NEGATIVE mg/dL
Specific Gravity, Urine: 1.024 (ref 1.005–1.030)
Urobilinogen, UA: 1 mg/dL (ref 0.0–1.0)
pH: 5.5 (ref 5.0–8.0)

## 2013-10-29 LAB — COMPREHENSIVE METABOLIC PANEL
ALT: 13 U/L (ref 0–53)
AST: 14 U/L (ref 0–37)
Albumin: 4 g/dL (ref 3.5–5.2)
Alkaline Phosphatase: 65 U/L (ref 39–117)
Anion gap: 19 — ABNORMAL HIGH (ref 5–15)
BUN: 22 mg/dL (ref 6–23)
CO2: 24 mEq/L (ref 19–32)
Calcium: 9.5 mg/dL (ref 8.4–10.5)
Chloride: 94 mEq/L — ABNORMAL LOW (ref 96–112)
Creatinine, Ser: 0.61 mg/dL (ref 0.50–1.35)
GFR calc Af Amer: >90 mL/min (ref >90)
GFR calc non Af Amer: >90 mL/min (ref >90)
Glucose, Bld: 154 mg/dL — ABNORMAL HIGH (ref 70–99)
Potassium: 4.4 mEq/L (ref 3.7–5.3)
Sodium: 137 mEq/L (ref 137–147)
Total Bilirubin: 0.9 mg/dL (ref 0.3–1.2)
Total Protein: 7.7 g/dL (ref 6.0–8.3)

## 2013-10-29 MED ORDER — SODIUM CHLORIDE 0.9 % IV BOLUS (SEPSIS)
1000.0000 mL | Freq: Once | INTRAVENOUS | Status: AC
  Administered 2013-10-29: 1000 mL via INTRAVENOUS

## 2013-10-29 MED ORDER — ONDANSETRON 4 MG PO TBDP
8.0000 mg | ORAL_TABLET | Freq: Once | ORAL | Status: AC
  Administered 2013-10-29: 8 mg via ORAL
  Filled 2013-10-29: qty 2

## 2013-10-29 MED ORDER — HYDROMORPHONE HCL PF 1 MG/ML IJ SOLN
1.0000 mg | Freq: Once | INTRAMUSCULAR | Status: AC
  Administered 2013-10-29: 1 mg via INTRAVENOUS
  Filled 2013-10-29: qty 1

## 2013-10-29 MED ORDER — ONDANSETRON HCL 4 MG/2ML IJ SOLN
4.0000 mg | Freq: Once | INTRAMUSCULAR | Status: AC
  Administered 2013-10-29: 4 mg via INTRAVENOUS
  Filled 2013-10-29: qty 2

## 2013-10-29 NOTE — Discharge Instructions (Signed)
Tylenol 1000 mg rotated with Motrin 600 mg every 4 hours as needed for pain or fever.  Return to the emergency department if you develop worsening of your symptoms, or if you develop other new and concerning symptoms.   Fever, Adult A fever is a higher than normal body temperature. In an adult, an oral temperature around 98.6 F (37 C) is considered normal. A temperature of 100.4 F (38 C) or higher is generally considered a fever. Mild or moderate fevers generally have no long-term effects and often do not require treatment. Extreme fever (greater than or equal to 106 F or 41.1 C) can cause seizures. The sweating that may occur with repeated or prolonged fever may cause dehydration. Elderly people can develop confusion during a fever. A measured temperature can vary with:  Age.  Time of day.  Method of measurement (mouth, underarm, rectal, or ear). The fever is confirmed by taking a temperature with a thermometer. Temperatures can be taken different ways. Some methods are accurate and some are not.  An oral temperature is used most commonly. Electronic thermometers are fast and accurate.  An ear temperature will only be accurate if the thermometer is positioned as recommended by the manufacturer.  A rectal temperature is accurate and done for those adults who have a condition where an oral temperature cannot be taken.  An underarm (axillary) temperature is not accurate and not recommended. Fever is a symptom, not a disease.  CAUSES   Infections commonly cause fever.  Some noninfectious causes for fever include:  Some arthritis conditions.  Some thyroid or adrenal gland conditions.  Some immune system conditions.  Some types of cancer.  A medicine reaction.  High doses of certain street drugs such as methamphetamine.  Dehydration.  Exposure to high outside or room temperatures.  Occasionally, the source of a fever cannot be determined. This is sometimes called a  "fever of unknown origin" (FUO).  Some situations may lead to a temporary rise in body temperature that may go away on its own. Examples are:  Childbirth.  Surgery.  Intense exercise. HOME CARE INSTRUCTIONS   Take appropriate medicines for fever. Follow dosing instructions carefully. If you use acetaminophen to reduce the fever, be careful to avoid taking other medicines that also contain acetaminophen. Do not take aspirin for a fever if you are younger than age 67. There is an association with Reye's syndrome. Reye's syndrome is a rare but potentially deadly disease.  If an infection is present and antibiotics have been prescribed, take them as directed. Finish them even if you start to feel better.  Rest as needed.  Maintain an adequate fluid intake. To prevent dehydration during an illness with prolonged or recurrent fever, you may need to drink extra fluid.Drink enough fluids to keep your urine clear or pale yellow.  Sponging or bathing with room temperature water may help reduce body temperature. Do not use ice water or alcohol sponge baths.  Dress comfortably, but do not over-bundle. SEEK MEDICAL CARE IF:   You are unable to keep fluids down.  You develop vomiting or diarrhea.  You are not feeling at least partly better after 3 days.  You develop new symptoms or problems. SEEK IMMEDIATE MEDICAL CARE IF:   You have shortness of breath or trouble breathing.  You develop excessive weakness.  You are dizzy or you faint.  You are extremely thirsty or you are making little or no urine.  You develop new pain that was not there before (  such as in the head, neck, chest, back, or abdomen).  You have persistent vomiting and diarrhea for more than 1 to 2 days.  You develop a stiff neck or your eyes become sensitive to light.  You develop a skin rash.  You have a fever or persistent symptoms for more than 2 to 3 days.  You have a fever and your symptoms suddenly get  worse. MAKE SURE YOU:   Understand these instructions.  Will watch your condition.  Will get help right away if you are not doing well or get worse. Document Released: 09/18/2000 Document Revised: 08/09/2013 Document Reviewed: 01/24/2011 [facility] Patient Information 2015 [place], [place]. This information is not intended to replace advice given to you by your health care provider. Make sure you discuss any questions you have with your health care provider.

## 2013-10-29 NOTE — Telephone Encounter (Signed)
Daughter just arrived home from work where she found pt still asleep where he's been sleeping since 1900 last PM. He is confused, temp 100.9, headache so bad he's vomiting. Daughter declines 911. RN spoke to Dr. Delena Serve CMA whe advises ED now. Daughter is aware. Confusion Protocol. "Severe headache and worsening confusion"

## 2013-10-29 NOTE — ED Notes (Signed)
Pt states he is unable to urinated at this time.

## 2013-10-29 NOTE — ED Notes (Signed)
Pt's daughter states that she doesn't feel comfortable at this time discharging pt, states his headache has come back. EDP at bedside to address

## 2013-10-29 NOTE — ED Notes (Addendum)
Pt presents with nausea and a headache starting yesterday after 4 pm. Pt's PCP sent him here for further evaluation. Per daughter pt was disoriented and "shaky." pt states he was unable to get out of the bed to go to the rest room and had to use his "bottle." Daughter at bedside requesting to have his blood work drawn.   No neuro deficits noted, grips and strengths equal bilaterally, no arm or leg drift noted, no facial droop. Pt alert and oriented x4 answering all questions appropriately

## 2013-10-29 NOTE — Telephone Encounter (Signed)
[facility] Triage Call Report Triage Record Num: [id number] Operator: Trinna Balloon Patient Name: [name] Call Date & Time: 10/29/2013 3:41:57PM Patient Phone: [phone] PCP: [name] Patient Gender: Male PCP Fax : [phone] Patient DOB: [birth year] Practice Name: [facility] Reason for Call: Caller: April/Child; Patient Name: [name]; PCP: [name] (Adults only); Best Callback Phone Number: [phone] Daughter just arrived home from work where she found pt still asleep where he's been sleeping since 1900 last PM. He is confused, temp 100.9, headache so bad he's vomiting. Daughter declines 911. RN spoke to Dr. Delena Serve CMA whe advises ED now. Daughter is aware. Confusion Protocol. "Severe headache and worsening confusion" Protocol(s) Used: Confusion, Disorientation, Agitation Recommended Outcome per Protocol: Activate EMS 911 Reason for Outcome: Severe headache AND new or worsening confusion, disorientation or agitation Care Advice: Write down provider's name. List or place the following in a bag for transport with the patient: current prescription and/or nonprescription medications; alternative treatments, therapies and medications; and street drugs. ~ 07/

## 2013-10-29 NOTE — ED Notes (Signed)
Pt aware he needs to provide urine sample, provided urinal.

## 2013-10-29 NOTE — ED Provider Notes (Addendum)
CSN: [id number]     Arrival date & time 10/29/13  1646 History   First MD Initiated Contact with Patient 10/29/13 1920     Chief Complaint  Patient presents with  . Nausea  . Headache     (Consider location/radiation/quality/duration/timing/severity/associated sxs/prior Treatment) HPI Comments: Patient is a 68 year old male with history of hypertension, hemochromatosis, coronary artery disease. He underwent lumbar spine surgery by Dr. Joya Salm last fall. He developed some sort of an infection postoperatively which required an extended hospitalization and long-term IV antibiotics. He is been having intermittent headaches since that time. Workup in the these headaches as BuSpar been unremarkable.  He presents here today for evaluation of fever, shaking chills, and disorientation. His daughter noticed him this way this afternoon. They called her primary doctor and was told to come here to be evaluated and likely admitted.  Patient is a 68 y.o. male presenting with headaches.  Headache Pain location:  Generalized Quality:  Stabbing Radiates to:  Does not radiate Onset quality:  Gradual Timing:  Intermittent Progression:  Worsening Chronicity:  Recurrent Relieved by:  Nothing Worsened by:  Nothing tried Ineffective treatments:  None tried   Past Medical History  Diagnosis Date  . Hx of colonic polyps   . Diverticulosis   . Hypertension   . 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 CAD (coronary artery disease)     minimal coronary plaque in the LAD and right coronary system. 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 7-06. Last nuclear stress 11-17-06 fixed anterior/ inferior defect, no inducible ischemia, EF 81%  . Allergic rhinitis   . Hx of colonoscopy   . Dysrhythmia 01-24-12    past hx. A.Fib x1 episode-responded to med.  . Depression   . GERD (gastroesophageal reflux  disease)   . COPD (chronic obstructive pulmonary disease)     pt denies this hx on 05/24/2013  . Osteoarthritis   . RA (rheumatoid arthritis)   . Chronic back pain     "all over back" (06/16/2013)  . PONV (postoperative nausea and vomiting)   . High cholesterol   . Sleep apnea     "never dx'd; snores bad" (06/16/2013)  . Daily headache   . Falls frequently     "since 02/2013" (06/16/2013)  . Anxiety   . Narcotic abuse     per family  . Alcoholism /alcohol abuse     per family  . Dementia    Past Surgical History  Procedure Laterality Date  . Appendectomy    . Total knee arthroplasty Right 2002     partial  . Shoulder hemi-arthroplasty Left 2008    partial  . Foot surgery Right 11-08    for removal of bone spurs-  . Ankle reconstruction Right 6-09    [facility]  . Posterior lumbar fusion  12-10    L4-5 diskectomy w/ fusion, cage placement and rods; [facility]  . Partial knee arthroplasty Left   . Total shoulder replacement Right   . Joint replacement    . Cataract extraction, bilateral Bilateral 01-24-12  . Orif shoulder fracture  02/06/2012    Procedure: OPEN REDUCTION INTERNAL FIXATION (ORIF) SHOULDER FRACTURE;  Surgeon: Nita Sells, MD;  Location: [facility];  Service: Orthopedics;  Laterality: Left;  ORIF of a Left Shoulder Fracture with  Iliac Crest Bone Graft aspiration   . Harvest bone graft  02/06/2012  Procedure: HARVEST ILIAC BONE GRAFT;  Surgeon: Nita Sells, MD;  Location: [facility];  Service: Orthopedics;;  bone marrow aspirqation   . Hardware removal  03/09/2012    Procedure: HARDWARE REMOVAL;  Surgeon: Nita Sells, MD;  Location: [facility];  Service: Orthopedics;  Laterality: Left;  Hardware Removal from Left Shoulder  . Shoulder arthroscopy Right 2013  . Carpal tunnel release Right 1990's  . Total ankle replacement Right 2008    at [facility]  . Hammer toe surgery Right 07/2012    "broke 4 hammertoes"   . Posterior lumbar  fusion 4 level N/A 03/02/2013    Procedure: Lumbar One to Sacral One Posterior lumbar interbody fusion;  Surgeon: Floyce Stakes, MD;  Location: [facility];  Service: Neurosurgery;  Laterality: N/A;  L1 to S1 Posterior lumbar interbody fusion  . Lumbar wound debridement N/A 03/17/2013    Procedure: Incision and drainage of superficial lumbar wound;  Surgeon: Floyce Stakes, MD;  Location: [facility];  Service: Neurosurgery;  Laterality: N/A;  Incision and drainage of superficial lumbar wound  . Tonsillectomy    . Coronary angioplasty with stent placement  2006    "while repairing 1st stent, a second area tore and they had to place 2nd stent " ?LAD & CX  . Inguinal hernia repair Bilateral    Family History  Problem Relation Age of Onset  . Uterine cancer Mother     survivor  . Macular degeneration Mother   . Other Mother     ankle edema  . Lung cancer Mother   . Cancer Mother     ovarian, lung  . Coronary artery disease Father   . Hypertension Father   . Prostate cancer Father   . Colon polyps Father   . Cancer Father     prostate  . Thyroid disease Sister   . Heart attack Brother   . Hyperlipidemia Brother   . Other Brother     Schizophrenic  . Diabetes Neg Hx   . Colon cancer Neg Hx   . Esophageal cancer Neg Hx   . Rectal cancer Neg Hx   . Stomach cancer Neg Hx   . Coronary artery disease Maternal Aunt   . Heart attack Maternal Aunt   . Rheum arthritis Sister   . Hemochromatosis Sister    History  Substance Use Topics  . Smoking status: Never Smoker   . Smokeless tobacco: Never Used  . Alcohol Use: 0.0 oz/week     Comment: 06/16/2013 "used to drink alot; was pretty bad about it; don't think he's had any alcohol in the past year"    Review of Systems  Neurological: Positive for headaches.  All other systems reviewed and are negative.     Allergies  Morphine and related; Penicillins; Cefixime; and Percocet  Home Medications   Prior to Admission  medications   Medication Sig Start Date End Date Taking? Authorizing Provider  Adalimumab (HUMIRA PEN [place]) Inject into the skin. Daughter is unsure how she gives it to him. Get it from a mail order pharmacy. She is not sure the name of it.   Yes Historical Provider, MD  aspirin (ASPIRIN EC) 81 MG EC tablet Take 1 tablet (81 mg total) by mouth every morning. 06/10/13  Yes Daniel J Angiulli, PA-C  cholecalciferol (VITAMIN D) 1000 UNITS tablet Take 1 tablet (1,000 Units total) by mouth daily. 06/10/13  Yes Daniel J Angiulli, PA-C  divalproex (DEPAKOTE ER) 250 MG  24 hr tablet Take 1 tablet (250 mg total) by mouth at bedtime. Take 1 pill nightly for 1 week, then 2 pills each night thereafter. 09/20/13  Yes Star Age, MD  FLUoxetine (PROZAC) 20 MG capsule Take 40 mg by mouth daily. 06/19/13  Yes Domenic Polite, MD  gabapentin (NEURONTIN) 300 MG capsule Take 1 capsule (300 mg total) by mouth 3 (three) times daily. 10/07/13  Yes Aleksei Plotnikov V, MD  losartan-hydrochlorothiazide (HYZAAR) 50-12.5 MG per tablet Take 1 tablet by mouth daily. 10/07/13  Yes Aleksei Plotnikov V, MD  metoprolol tartrate (LOPRESSOR) 25 MG tablet Take 1 tablet (25 mg total) by mouth 2 (two) times daily. 06/10/13  Yes Daniel J Angiulli, PA-C  naproxen sodium (ANAPROX) 220 MG tablet Take 220 mg by mouth 2 (two) times daily with a meal.   Yes Historical Provider, MD  pantoprazole (PROTONIX) 20 MG tablet Take 2 tablets (40 mg total) by mouth 2 (two) times daily. 09/27/13  Yes Aleksei Plotnikov V, MD  potassium chloride SA (KLOR-CON M20) 20 MEQ tablet Take 1 tablet (20 mEq total) by mouth 2 (two) times daily. 10/13/13  Yes Aleksei Plotnikov V, MD  predniSONE (DELTASONE) 5 MG tablet Take 1 tablet (5 mg total) by mouth every evening. 06/10/13  Yes Daniel J Angiulli, PA-C  simvastatin (ZOCOR) 20 MG tablet Take 1 tablet (20 mg total) by mouth at bedtime. 08/10/13  Yes Aleksei Plotnikov V, MD  sodium chloride 0.9 % SOLN 100 mL with valproate 500 MG/5ML SOLN  500 mg Inject 500 mg into the vein once. 09/07/13  Yes Carmen Dohmeier, MD  SUMAtriptan (IMITREX) 100 MG tablet Take 100 mg by mouth See admin instructions. TAKE 1 TABLET BY MOUTH EVERY 2 HOURS AS NEEDED FOR MIGRAINE/HEADACHE REPEAT IN 2 HOURS IF PRESIST 10/07/13  Yes Aleksei Plotnikov V, MD   BP 158/97  Pulse 95  Temp(Src) 99.8 F (37.7 C) (Oral)  Resp 18  Ht 5' 6"  (1.676 m)  Wt 155 lb (70.308 kg)  BMI 25.03 kg/m2  SpO2 91% Physical Exam  Nursing note and vitals reviewed. Constitutional: He is oriented to person, place, and time. He appears well-developed and well-nourished. No distress.  HENT:  Head: Normocephalic and atraumatic.  Mouth/Throat: Oropharynx is clear and moist.  Eyes: EOM are normal. Pupils are equal, round, and reactive to light.  Neck: Normal range of motion. Neck supple.  Neck has full range of motion. There is no nuchal rigidity.  Cardiovascular: Normal rate, regular rhythm and normal heart sounds.   No murmur heard. Pulmonary/Chest: Effort normal and breath sounds normal.  Abdominal: Soft. Bowel sounds are normal.  Musculoskeletal: Normal range of motion. He exhibits no edema.  Lymphadenopathy:    He has no cervical adenopathy.  Neurological: He is alert and oriented to person, place, and time. No cranial nerve deficit. He exhibits normal muscle tone. Coordination normal.  Skin: Skin is warm and dry. He is not diaphoretic.    ED Course  Procedures (including critical care time) Labs Review Labs Reviewed  CBC WITH DIFFERENTIAL - Abnormal; Notable for the following:    WBC 11.6 (*)    Neutrophils Relative % 78 (*)    Neutro Abs 9.0 (*)    Lymphocytes Relative 8 (*)    Monocytes Relative 14 (*)    Monocytes Absolute 1.7 (*)    All other components within normal limits  COMPREHENSIVE METABOLIC PANEL - Abnormal; Notable for the following:    Chloride 94 (*)    Glucose, Bld  154 (*)    Anion gap 19 (*)    All other components within normal limits  CULTURE,  BLOOD (ROUTINE X 2)  CULTURE, BLOOD (ROUTINE X 2)  URINALYSIS, ROUTINE W REFLEX MICROSCOPIC    Imaging Review No results found.   EKG Interpretation None      MDM   Final diagnoses:  None    Patient is a 68 year old male with history of sepsis related to a back surgery. He was treated with long-term antibiotics and had a rather rocky course while in the hospital. He presents today with fever and headache. His headaches have been ongoing for several months and workup thus far has not revealed any cause. Today he felt chilled and his temperature was close to 100. He presents for evaluation of this. Physical examination reveals no obvious cause and the patient has no specific complaints that would be the source. He does complain of headache which she's had for quite some time. He does not appear meningitic and has no nuchal rigidity. I do not feel as though an LP is indicated. Workup reveals a mildly elevated white count, however urine and chest x-ray are unremarkable. Blood cultures were obtained and are pending.  I spent a good deal of time discussing the disposition the patient and family. They are understandably concerned about his condition due to his prior illness. We have come to the decision that he will be discharged. His blood cultures are pending and should these require further treatment, he will be called and informed of this. In the meantime he is to take Tylenol and Motrin as needed for his headache and fever and return to the emergency department if his symptoms substantially worsen or change.    Veryl Speak, MD 10/29/13 2919  Veryl Speak, MD 10/29/13 2313   Addendum: As the patient was being discharged by the nurse, the patient and family informed her that he was developing a worsening headache and they became uncomfortable with the prior plan was discharged. They're requesting that he be admitted for rule out of sepsis and further evaluation of his headaches. I've  spoken with Dr. Ernestina Patches from the hospitalist service who will value with the patient. He is asked that I speak with neurology. In discussion with Dr. Leonel Ramsay, he does not feel as though an LP is indicated. If after evaluating the patient, Dr. Ernestina Patches feels as thought formal Neurology consultation is indicated he is to call Dr. Leonel Ramsay.  Veryl Speak, MD 10/30/13 [phone]

## 2013-10-30 ENCOUNTER — Encounter (HOSPITAL_COMMUNITY): Payer: Self-pay | Admitting: *Deleted

## 2013-10-30 ENCOUNTER — Observation Stay (HOSPITAL_COMMUNITY): Payer: Medicare Other

## 2013-10-30 DIAGNOSIS — G44049 Chronic paroxysmal hemicrania, not intractable: Secondary | ICD-10-CM

## 2013-10-30 DIAGNOSIS — R51 Headache: Secondary | ICD-10-CM

## 2013-10-30 DIAGNOSIS — R519 Headache, unspecified: Secondary | ICD-10-CM | POA: Diagnosis present

## 2013-10-30 DIAGNOSIS — R509 Fever, unspecified: Secondary | ICD-10-CM

## 2013-10-30 DIAGNOSIS — D72829 Elevated white blood cell count, unspecified: Secondary | ICD-10-CM

## 2013-10-30 LAB — CREATININE, SERUM
Creatinine, Ser: 0.6 mg/dL (ref 0.50–1.35)
GFR calc Af Amer: >90 mL/min (ref >90)
GFR calc non Af Amer: >90 mL/min (ref >90)

## 2013-10-30 LAB — CBC WITH DIFFERENTIAL/PLATELET
Basophils Absolute: 0 10*3/uL (ref 0.0–0.1)
Basophils Relative: 0 % (ref 0–1)
Eosinophils Absolute: 0 10*3/uL (ref 0.0–0.7)
Eosinophils Relative: 0 % (ref 0–5)
HCT: 35.6 % — ABNORMAL LOW (ref 39.0–52.0)
Hemoglobin: 11.8 g/dL — ABNORMAL LOW (ref 13.0–17.0)
Lymphocytes Relative: 21 % (ref 12–46)
Lymphs Abs: 2 10*3/uL (ref 0.7–4.0)
MCH: 31.3 pg (ref 26.0–34.0)
MCHC: 33.1 g/dL (ref 30.0–36.0)
MCV: 94.4 fL (ref 78.0–100.0)
Monocytes Absolute: 1.9 10*3/uL — ABNORMAL HIGH (ref 0.1–1.0)
Monocytes Relative: 20 % — ABNORMAL HIGH (ref 3–12)
Neutro Abs: 5.8 10*3/uL (ref 1.7–7.7)
Neutrophils Relative %: 60 % (ref 43–77)
Platelets: 183 10*3/uL (ref 150–400)
RBC: 3.77 MIL/uL — ABNORMAL LOW (ref 4.22–5.81)
RDW: 13.3 % (ref 11.5–15.5)
WBC: 9.7 10*3/uL (ref 4.0–10.5)

## 2013-10-30 LAB — COMPREHENSIVE METABOLIC PANEL
ALT: 9 U/L (ref 0–53)
AST: 8 U/L (ref 0–37)
Albumin: 3 g/dL — ABNORMAL LOW (ref 3.5–5.2)
Alkaline Phosphatase: 50 U/L (ref 39–117)
Anion gap: 12 (ref 5–15)
BUN: 21 mg/dL (ref 6–23)
CO2: 26 mEq/L (ref 19–32)
Calcium: 8.4 mg/dL (ref 8.4–10.5)
Chloride: 98 mEq/L (ref 96–112)
Creatinine, Ser: 0.58 mg/dL (ref 0.50–1.35)
GFR calc Af Amer: >90 mL/min (ref >90)
GFR calc non Af Amer: >90 mL/min (ref >90)
Glucose, Bld: 110 mg/dL — ABNORMAL HIGH (ref 70–99)
Potassium: 3.7 mEq/L (ref 3.7–5.3)
Sodium: 136 mEq/L — ABNORMAL LOW (ref 137–147)
Total Bilirubin: 0.7 mg/dL (ref 0.3–1.2)
Total Protein: 6 g/dL (ref 6.0–8.3)

## 2013-10-30 LAB — RAPID URINE DRUG SCREEN, HOSP PERFORMED
Amphetamines: NOT DETECTED
Barbiturates: NOT DETECTED
Benzodiazepines: NOT DETECTED
Cocaine: NOT DETECTED
Opiates: NOT DETECTED
Tetrahydrocannabinol: POSITIVE — AB

## 2013-10-30 LAB — CBC
HCT: 35.6 % — ABNORMAL LOW (ref 39.0–52.0)
Hemoglobin: 12 g/dL — ABNORMAL LOW (ref 13.0–17.0)
MCH: 31.8 pg (ref 26.0–34.0)
MCHC: 33.7 g/dL (ref 30.0–36.0)
MCV: 94.4 fL (ref 78.0–100.0)
Platelets: 184 10*3/uL (ref 150–400)
RBC: 3.77 MIL/uL — ABNORMAL LOW (ref 4.22–5.81)
RDW: 13.3 % (ref 11.5–15.5)
WBC: 9.7 10*3/uL (ref 4.0–10.5)

## 2013-10-30 MED ORDER — POTASSIUM CHLORIDE CRYS ER 20 MEQ PO TBCR
20.0000 meq | EXTENDED_RELEASE_TABLET | Freq: Two times a day (BID) | ORAL | Status: DC
  Administered 2013-10-30 – 2013-11-01 (×6): 20 meq via ORAL
  Filled 2013-10-30 (×8): qty 1

## 2013-10-30 MED ORDER — SODIUM CHLORIDE 0.9 % IJ SOLN
3.0000 mL | Freq: Two times a day (BID) | INTRAMUSCULAR | Status: DC
  Administered 2013-10-30 – 2013-10-31 (×3): 3 mL via INTRAVENOUS

## 2013-10-30 MED ORDER — HYDROMORPHONE HCL PF 1 MG/ML IJ SOLN
0.5000 mg | INTRAMUSCULAR | Status: DC | PRN
  Administered 2013-10-30 (×3): 0.5 mg via INTRAVENOUS
  Filled 2013-10-30 (×3): qty 1

## 2013-10-30 MED ORDER — GABAPENTIN 300 MG PO CAPS
300.0000 mg | ORAL_CAPSULE | Freq: Three times a day (TID) | ORAL | Status: DC
  Administered 2013-10-30 – 2013-11-01 (×7): 300 mg via ORAL
  Filled 2013-10-30 (×9): qty 1

## 2013-10-30 MED ORDER — VITAMIN D3 25 MCG (1000 UNIT) PO TABS
1000.0000 [IU] | ORAL_TABLET | Freq: Every day | ORAL | Status: DC
  Administered 2013-10-30 – 2013-10-31 (×2): 1000 [IU] via ORAL
  Filled 2013-10-30 (×4): qty 1

## 2013-10-30 MED ORDER — METOPROLOL TARTRATE 25 MG PO TABS
25.0000 mg | ORAL_TABLET | Freq: Two times a day (BID) | ORAL | Status: DC
  Administered 2013-10-30 – 2013-11-01 (×6): 25 mg via ORAL
  Filled 2013-10-30 (×8): qty 1

## 2013-10-30 MED ORDER — DIVALPROEX SODIUM ER 250 MG PO TB24
250.0000 mg | ORAL_TABLET | Freq: Every day | ORAL | Status: DC
  Administered 2013-10-30: 250 mg via ORAL
  Filled 2013-10-30 (×2): qty 1

## 2013-10-30 MED ORDER — SODIUM CHLORIDE 0.9 % IV SOLN
INTRAVENOUS | Status: DC
  Administered 2013-10-30 – 2013-10-31 (×2): via INTRAVENOUS
  Administered 2013-10-31: 1000 mL via INTRAVENOUS
  Administered 2013-11-01: via INTRAVENOUS

## 2013-10-30 MED ORDER — ONDANSETRON HCL 4 MG/2ML IJ SOLN
4.0000 mg | Freq: Four times a day (QID) | INTRAMUSCULAR | Status: DC | PRN
  Administered 2013-10-30: 4 mg via INTRAVENOUS
  Filled 2013-10-30: qty 2

## 2013-10-30 MED ORDER — PANTOPRAZOLE SODIUM 40 MG PO TBEC
40.0000 mg | DELAYED_RELEASE_TABLET | Freq: Two times a day (BID) | ORAL | Status: DC
  Administered 2013-10-30 – 2013-11-01 (×6): 40 mg via ORAL
  Filled 2013-10-30 (×6): qty 1

## 2013-10-30 MED ORDER — LOSARTAN POTASSIUM 50 MG PO TABS
50.0000 mg | ORAL_TABLET | Freq: Every day | ORAL | Status: DC
  Administered 2013-10-30 – 2013-11-01 (×3): 50 mg via ORAL
  Filled 2013-10-30 (×3): qty 1

## 2013-10-30 MED ORDER — LOSARTAN POTASSIUM-HCTZ 50-12.5 MG PO TABS: 1.0000 | ORAL_TABLET | Freq: Every day | ORAL | Status: DC

## 2013-10-30 MED ORDER — DIVALPROEX SODIUM ER 500 MG PO TB24
500.0000 mg | ORAL_TABLET | Freq: Every day | ORAL | Status: DC
  Administered 2013-10-30 – 2013-10-31 (×2): 500 mg via ORAL
  Filled 2013-10-30 (×3): qty 1

## 2013-10-30 MED ORDER — HEPARIN SODIUM (PORCINE) 5000 UNIT/ML IJ SOLN
5000.0000 [IU] | Freq: Three times a day (TID) | INTRAMUSCULAR | Status: DC
  Administered 2013-10-30 – 2013-11-01 (×7): 5000 [IU] via SUBCUTANEOUS
  Filled 2013-10-30 (×13): qty 1

## 2013-10-30 MED ORDER — FLUOXETINE HCL 20 MG PO CAPS
40.0000 mg | ORAL_CAPSULE | Freq: Every day | ORAL | Status: DC
  Administered 2013-10-30 – 2013-11-01 (×3): 40 mg via ORAL
  Filled 2013-10-30 (×3): qty 2

## 2013-10-30 MED ORDER — PREDNISONE 5 MG PO TABS
5.0000 mg | ORAL_TABLET | Freq: Every evening | ORAL | Status: DC
  Filled 2013-10-30: qty 1

## 2013-10-30 MED ORDER — KETOROLAC TROMETHAMINE 15 MG/ML IJ SOLN
15.0000 mg | Freq: Once | INTRAMUSCULAR | Status: AC
  Administered 2013-10-30: 15 mg via INTRAVENOUS
  Filled 2013-10-30: qty 1

## 2013-10-30 MED ORDER — METHOCARBAMOL 500 MG PO TABS
500.0000 mg | ORAL_TABLET | Freq: Four times a day (QID) | ORAL | Status: DC
  Administered 2013-10-30 – 2013-11-01 (×7): 500 mg via ORAL
  Filled 2013-10-30 (×12): qty 1

## 2013-10-30 MED ORDER — SIMVASTATIN 20 MG PO TABS
20.0000 mg | ORAL_TABLET | Freq: Every day | ORAL | Status: DC
  Administered 2013-10-30 – 2013-10-31 (×2): 20 mg via ORAL
  Filled 2013-10-30 (×3): qty 1

## 2013-10-30 MED ORDER — ASPIRIN EC 81 MG PO TBEC
81.0000 mg | DELAYED_RELEASE_TABLET | Freq: Every morning | ORAL | Status: DC
  Administered 2013-10-30 – 2013-11-01 (×3): 81 mg via ORAL
  Filled 2013-10-30 (×3): qty 1

## 2013-10-30 MED ORDER — PREDNISONE 5 MG PO TABS
5.0000 mg | ORAL_TABLET | Freq: Every evening | ORAL | Status: DC
  Administered 2013-10-31: 5 mg via ORAL
  Filled 2013-10-30 (×2): qty 1

## 2013-10-30 MED ORDER — ACETAMINOPHEN 325 MG PO TABS: 650.0000 mg | ORAL_TABLET | Freq: Four times a day (QID) | ORAL | Status: DC | PRN

## 2013-10-30 MED ORDER — HYDROCHLOROTHIAZIDE 12.5 MG PO CAPS
12.5000 mg | ORAL_CAPSULE | Freq: Every day | ORAL | Status: DC
  Administered 2013-10-30 – 2013-11-01 (×3): 12.5 mg via ORAL
  Filled 2013-10-30 (×3): qty 1

## 2013-10-30 MED ORDER — METHYLPREDNISOLONE SODIUM SUCC 125 MG IJ SOLR
125.0000 mg | Freq: Once | INTRAMUSCULAR | Status: AC
  Administered 2013-10-30: 125 mg via INTRAVENOUS
  Filled 2013-10-30 (×2): qty 2

## 2013-10-30 MED ORDER — MORPHINE SULFATE 2 MG/ML IJ SOLN: 2.0000 mg | INTRAMUSCULAR | Status: DC | PRN

## 2013-10-30 NOTE — ED Notes (Signed)
April [phone]. Daughter, call with updates

## 2013-10-30 NOTE — Progress Notes (Signed)
Patient seen and examined For the exacerbation of his chronic headache, will administer one dose of 125 mg of Solu-Medrol. We'll increase Depakote to 500 each bedtime as recommended by Dr. Rexene Alberts Discussed with Dr. Nicole Kindred for further recommendations

## 2013-10-30 NOTE — ED Notes (Signed)
Attempted to call report x 1  

## 2013-10-30 NOTE — H&P (Signed)
Hospitalist Admission History and Physical  Patient name: [name] Medical record number: [id number] Date of birth: 04/22/1945 Age: 68 y.o. Gender: male  Primary Care Provider: [name], MD  Chief Complaint: headache  History of Present Illness:This is a 68 y.o. year old male with significant past medical history of degenerative arthritis of the spine s/p lumbar disc surgery with secondary discitis, chronic headache, CAD, chronic pain, RA,   presenting with headache. Pt states that pt has had severe recurrent headaches since 05/2013 in lieu of post operative wound for lumbar surgery. Pt has been seen by outpt neurology as well as headache specialist. Was place on depakote prophylactically, with imitrex for breakthrough flares. Pt states that he has recurrent headaches on a weekly basis that improve mildly with imitrex. Has had severe flare of HA over past 1-2 days. Denies any fevers or chills. No nuchal rigidity. HA is in its usual distribution, with posterior headache and R sided frontal/retro-orbital distibution. + photophobia. Does have aura at times.  Presented to ER today with worsening pain. tmax 99.8, HR mainly in 80s, BP 329J-242A systolic, Satting 83-41% on RA. WBC 11.6, Hgb 14.9, na 137, Cr 0.6. CXR with bibasilar atelectasis. No acute findings. UA not indicative of infection.   Assessment and Plan: [name] is a 67 y.o. year old male presenting with headache   Active Problems:   Headache   Leukocytosis   Headache-Acute on chronic issue. Seems to fit migrainous pattern. No focal deficits on exam, though with fairly extensive neurological/neurosurigical history. Also concominant immunosuppressant use. Formally consult neuro as to whether pt may benefit from LP. Panculture in the interim. Hold abx pending neuro eval. Low dose morphine for pain control in the interim ( noted hx/o ? Narcotic abuse and ? Encephalopathy 2/2 polypharmacy). Continue ppx medication. F/u  on neuro recs.   Leukocytosis: Borderline temp on presentation. Does not meet full sepsis criteria as of yet (subclinical temp, WBC). No overt sources of infection currently, though higher risk in setting of immunosuppressant use and prior medical history. Pan culture. Broad spectrum abx if pt spikes fever as well as LP if not already performed.   CAD/HTN: no active CP currently. Hemodynamically stable. Continue home regimen.   RA: continue humira and prednisone.   Chronic Pain: low dose morphine overnight. Continue neurontin.   FEN/GI: heart healthy diet.  Prophylaxis: sub q heparin  Disposition: pending further evaluation  Code Status:Full Code    Patient Active Problem List   Diagnosis Date Noted  . Headache 10/30/2013  . Fatigue 10/22/2013  . Hoarseness of voice 08/10/2013  . Sleep disorder 07/05/2013  . Tension headache 07/01/2013  . Polypharmacy 06/19/2013  . Lumbar post-laminectomy syndrome 05/27/2013  . Degenerative arthritis of spine 05/26/2013  . Hemochromatosis 05/26/2013  . Wound infection after surgery 05/26/2013  . Normocytic anemia 05/26/2013  . Unintentional weight loss 05/26/2013  . Protein-calorie malnutrition, severe 05/25/2013  . Chronic pain syndrome 05/23/2013  . COPD (chronic obstructive pulmonary disease) 05/06/2013  . Acute encephalopathy 05/06/2013  . Frequent falls 05/06/2013  . Encounter for postoperative wound care 05/06/2013  . Atrial fibrillation 08/07/2010  . Rheumatoid arthritis(714.0) 10/18/2008  . DYSLIPIDEMIA 03/30/2007  . PSEUDOGOUT 03/30/2007  . HYPERTENSION 03/30/2007  . CAD 03/30/2007   Past Medical History: Past Medical History  Diagnosis Date  . Hx of colonic polyps   . Diverticulosis   . Hypertension   . Hemochromatosis     dx'd 14 yrs ago last ferritin Aug 11, 08  52 (22-322), Fe 136  . CAD (coronary artery disease)     minimal coronary plaque in the LAD and right coronary system. PCI of a 95% obtuse marginal lesion w/  resultant spiral dissection requiring drug-eluting stent placement. 7-06. Last nuclear stress 11-17-06 fixed anterior/ inferior defect, no inducible ischemia, EF 81%  . Allergic rhinitis   . Hx of colonoscopy   . Dysrhythmia 01-24-12    past hx. A.Fib x1 episode-responded to med.  . Depression   . GERD (gastroesophageal reflux disease)   . COPD (chronic obstructive pulmonary disease)     pt denies this hx on 05/24/2013  . Osteoarthritis   . RA (rheumatoid arthritis)   . Chronic back pain     "all over back" (06/16/2013)  . PONV (postoperative nausea and vomiting)   . High cholesterol   . Sleep apnea     "never dx'd; snores bad" (06/16/2013)  . Daily headache   . Falls frequently     "since 02/2013" (06/16/2013)  . Anxiety   . Narcotic abuse     per family  . Alcoholism /alcohol abuse     per family  . Dementia     Past Surgical History: Past Surgical History  Procedure Laterality Date  . Appendectomy    . Total knee arthroplasty Right 2002     partial  . Shoulder hemi-arthroplasty Left 2008    partial  . Foot surgery Right 11-08    for removal of bone spurs-  . Ankle reconstruction Right 6-09    [facility]  . Posterior lumbar fusion  12-10    L4-5 diskectomy w/ fusion, cage placement and rods; [facility]  . Partial knee arthroplasty Left   . Total shoulder replacement Right   . Joint replacement    . Cataract extraction, bilateral Bilateral 01-24-12  . Orif shoulder fracture  02/06/2012    Procedure: OPEN REDUCTION INTERNAL FIXATION (ORIF) SHOULDER FRACTURE;  Surgeon: Nita Sells, MD;  Location: [facility];  Service: Orthopedics;  Laterality: Left;  ORIF of a Left Shoulder Fracture with  Iliac Crest Bone Graft aspiration   . Harvest bone graft  02/06/2012    Procedure: HARVEST ILIAC BONE GRAFT;  Surgeon: Nita Sells, MD;  Location: [facility];  Service: Orthopedics;;  bone marrow aspirqation   . Hardware removal  03/09/2012    Procedure: HARDWARE REMOVAL;   Surgeon: Nita Sells, MD;  Location: [facility];  Service: Orthopedics;  Laterality: Left;  Hardware Removal from Left Shoulder  . Shoulder arthroscopy Right 2013  . Carpal tunnel release Right 1990's  . Total ankle replacement Right 2008    at [facility]  . Hammer toe surgery Right 07/2012    "broke 4 hammertoes"   . Posterior lumbar fusion 4 level N/A 03/02/2013    Procedure: Lumbar One to Sacral One Posterior lumbar interbody fusion;  Surgeon: Floyce Stakes, MD;  Location: [facility];  Service: Neurosurgery;  Laterality: N/A;  L1 to S1 Posterior lumbar interbody fusion  . Lumbar wound debridement N/A 03/17/2013    Procedure: Incision and drainage of superficial lumbar wound;  Surgeon: Floyce Stakes, MD;  Location: [facility];  Service: Neurosurgery;  Laterality: N/A;  Incision and drainage of superficial lumbar wound  . Tonsillectomy    . Coronary angioplasty with stent placement  2006    "while repairing 1st stent, a second area tore and they had to place 2nd stent " ?LAD & CX  . Inguinal hernia repair  Bilateral     Social History: History   Social History  . Marital Status: Divorced    Spouse Name: N/A    Number of Children: 2  . Years of Education: 16   Occupational History  . 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 Smoking status: Never Smoker   . Smokeless tobacco: Never Used  . Alcohol Use: 0.0 oz/week     Comment: 06/16/2013 "used to drink alot; was pretty bad about it; don't think he's had any alcohol in the past year"  . Drug Use: No  . Sexual Activity: Yes    Partners: Female   Other Topics Concern  . None   Social History Narrative   [employer] - BA. Married 1972-108yr seperated/divorced. Engaged April '11. 1 son '74; step-daughter '70. 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 During '11  discovered an [employer]- $500,000+ loss. Is handling this OK - will keep the business running.    Family History: Family History  Problem Relation Age of Onset  . Uterine cancer Mother     survivor  . Macular degeneration Mother   . Other Mother     ankle edema  . Lung cancer Mother   . Cancer Mother     ovarian, lung  . Coronary artery disease Father   . Hypertension Father   . Prostate cancer Father   . Colon polyps Father   . Cancer Father     prostate  . Thyroid disease Sister   . Heart attack Brother   . Hyperlipidemia Brother   . Other Brother     Schizophrenic  . Diabetes Neg Hx   . Colon cancer Neg Hx   . Esophageal cancer Neg Hx   . Rectal cancer Neg Hx   . Stomach cancer Neg Hx   . Coronary artery disease Maternal Aunt   . Heart attack Maternal Aunt   . Rheum arthritis Sister   . Hemochromatosis Sister     Allergies: Allergies  Allergen Reactions  . Morphine And Related Shortness Of Breath, Nausea And Vomiting, Swelling and Other (See Comments)    Agitation, tolerates dilaudid  . Penicillins Hives, Shortness Of Breath and Rash    REACTION: hives, breathing problems  . Cefixime     rash  . Percocet [Oxycodone-Acetaminophen] Anxiety    Takes OxyContin at home.    Current Facility-Administered Medications  Medication Dose Route Frequency Provider Last Rate Last Dose  . 0.9 %  sodium chloride infusion   Intravenous Continuous SShanda Howells MD      . aspirin EC tablet 81 mg  81 mg Oral q morning - 10a SShanda Howells MD      . cholecalciferol (VITAMIN D) tablet 1,000 Units  1,000 Units Oral Daily SShanda Howells MD      . divalproex (DEPAKOTE ER) 24 hr tablet 250 mg  250 mg Oral QHS SShanda Howells MD      . FLUoxetine (PROZAC) capsule 40 mg  40 mg Oral Daily SShanda Howells MD      . gabapentin (NEURONTIN) capsule 300 mg  300 mg Oral TID SShanda Howells MD      . heparin injection 5,000 Units  5,000 Units Subcutaneous 3 times per day SShanda Howells MD      . losartan-hydrochlorothiazide (HYZAAR) 50-12.5 MG per tablet 1 tablet  1 tablet  Oral Daily Shanda Howells, MD      . methylPREDNISolone sodium succinate (SOLU-MEDROL) 500 mg in sodium chloride 0.9 % 100 mL IVPB  500 mg Intravenous Continuous Larey Seat, MD   500 mg at 09/08/13 1122  . metoprolol tartrate (LOPRESSOR) tablet 25 mg  25 mg Oral BID Shanda Howells, MD      . morphine 2 MG/ML injection 2-4 mg  2-4 mg Intravenous Q2H PRN Shanda Howells, MD      . pantoprazole (PROTONIX) EC tablet 40 mg  40 mg Oral BID Shanda Howells, MD      . potassium chloride SA (K-DUR,KLOR-CON) CR tablet 20 mEq  20 mEq Oral BID Shanda Howells, MD      . simvastatin (ZOCOR) tablet 20 mg  20 mg Oral QHS Shanda Howells, MD      . sodium chloride 0.9 % injection 3 mL  3 mL Intravenous Q12H Shanda Howells, MD      . valproate (DEPACON) 500 mg in sodium chloride 0.9 % 100 mL IVPB  500 mg Intravenous Continuous Larey Seat, MD 105 mL/hr at 09/08/13 1112 500 mg at 09/08/13 1112   Current Outpatient Prescriptions  Medication Sig Dispense Refill  . Adalimumab (HUMIRA PEN [place]) Inject into the skin. Daughter is unsure how she gives it to him. Get it from a mail order pharmacy. She is not sure the name of it.      [name] aspirin (ASPIRIN EC) 81 MG EC tablet Take 1 tablet (81 mg total) by mouth every morning.  30 tablet  12  . cholecalciferol (VITAMIN D) 1000 UNITS tablet Take 1 tablet (1,000 Units total) by mouth daily.  100 tablet  3  . divalproex (DEPAKOTE ER) 250 MG 24 hr tablet Take 1 tablet (250 mg total) by mouth at bedtime. Take 1 pill nightly for 1 week, then 2 pills each night thereafter.  60 tablet  5  . FLUoxetine (PROZAC) 20 MG capsule Take 40 mg by mouth daily.      [name] gabapentin (NEURONTIN) 300 MG capsule Take 1 capsule (300 mg total) by mouth 3 (three) times daily.  60 capsule  1  . losartan-hydrochlorothiazide (HYZAAR) 50-12.5 MG per tablet Take 1 tablet by mouth daily.  90 tablet  3  . metoprolol  tartrate (LOPRESSOR) 25 MG tablet Take 1 tablet (25 mg total) by mouth 2 (two) times daily.  60 tablet  1  . naproxen sodium (ANAPROX) 220 MG tablet Take 220 mg by mouth 2 (two) times daily with a meal.      . pantoprazole (PROTONIX) 20 MG tablet Take 2 tablets (40 mg total) by mouth 2 (two) times daily.  60 tablet  11  . potassium chloride SA (KLOR-CON M20) 20 MEQ tablet Take 1 tablet (20 mEq total) by mouth 2 (two) times daily.  60 tablet  5  . predniSONE (DELTASONE) 5 MG tablet Take 1 tablet (5 mg total) by mouth every evening.  30 tablet  1  . simvastatin (ZOCOR) 20 MG tablet Take 1 tablet (20 mg total) by mouth at bedtime.  90 tablet  3  . sodium chloride 0.9 % SOLN 100 mL with valproate 500 MG/5ML SOLN 500 mg Inject 500 mg into the vein once.  500 mg  1  . SUMAtriptan (IMITREX) 100 MG tablet Take 100 mg by mouth See admin instructions. TAKE 1 TABLET BY MOUTH EVERY 2 HOURS AS NEEDED FOR MIGRAINE/HEADACHE REPEAT IN 2 HOURS IF PRESIST       Review  Of Systems: 12 point ROS negative except as noted above in HPI.  Physical Exam: Filed Vitals:   10/29/13 2345  BP: 133/74  Pulse: 81  Temp:   Resp:     General: alert and cooperative HEENT: PERRLA, extra ocular movement intact and + mild photophobia  Heart: S1, S2 normal, no murmur, rub or gallop, regular rate and rhythm Lungs: clear to auscultation, no wheezes or rales and unlabored breathing Abdomen: abdomen is soft without significant tenderness, masses, organomegaly or guarding Extremities: extremities normal, atraumatic, no cyanosis or edema, noted post operative changes on lumbar spine-wound well healed.  Skin:no rashes, no ecchymoses Neurology: normal without focal findings and kernig and brudzinski negative   Labs and Imaging: Lab Results  Component Value Date/Time   NA 137 10/29/2013  5:23 PM   K 4.4 10/29/2013  5:23 PM   CL 94* 10/29/2013  5:23 PM   CO2 24 10/29/2013  5:23 PM   BUN 22 10/29/2013  5:23 PM   CREATININE 0.61  10/29/2013  5:23 PM   GLUCOSE 154* 10/29/2013  5:23 PM   Lab Results  Component Value Date   WBC 11.6* 10/29/2013   HGB 14.9 10/29/2013   HCT 43.1 10/29/2013   MCV 93.9 10/29/2013   PLT 214 10/29/2013    Dg Chest 2 View  10/29/2013   CLINICAL DATA:  Headache with nausea and dizziness.  EXAM: CHEST  2 VIEW  COMPARISON:  06/30/2013  FINDINGS: Lung volumes are low. Stable asymmetric elevation of the right hemidiaphragm. There is bibasilar atelectasis. No edema or focal airspace consolidation. No evidence for pleural effusion. Patient is status post bilateral shoulder replacement. Lumbar fusion hardware is incompletely visualized.  IMPRESSION: Bibasilar atelectasis.  No edema or focal airspace consolidation.   Electronically Signed   By: Misty Stanley M.D.   On: 10/29/2013 21:19           Shanda Howells MD  Pager: [phone]

## 2013-10-30 NOTE — ED Provider Notes (Signed)
  Physical Exam  BP 133/74  Pulse 81  Temp(Src) 99.8 F (37.7 C) (Oral)  Resp 16  Ht 5' 6"  (1.676 m)  Wt 155 lb (70.308 kg)  BMI 25.03 kg/m2  SpO2 95%  Physical Exam  ED Course  Procedures  MDM  Pt comes in with cc of headache. Feeling week, having nausea, emesis and low grade fever. Hospitalist to see patient for admission. No source of infection now.  Filed Vitals:   10/29/13 2345  BP: 133/74  Pulse: 81  Temp:   Resp:          Varney Biles, MD 10/30/13 0012

## 2013-10-30 NOTE — Consult Note (Addendum)
Reason for Consult: Severe intractable recurrent headache.  HPI:                                                                                                                                          [name] is an 68 y.o. male with a history of hypertension, hemochromatosis, COPD, osteoarthritis, rheumatoid arthritis, degenerative disease of the spine and history of lumbar laminectomy in February 2015, as well as daily headaches since surgery, presenting with severe recurrent intractable headache which started 2-3 days ago. He has been evaluated by a headache specialist and is currently being followed by [facility]. He was taking Depakote 250 mg twice a day for headache prevention as well as Imitrex for breakthrough headaches. Depakote has been increased to 500 mg twice a day since admission. CT scan of his head on admission showed no acute intracranial abnormality. He is currently being treated with Dilaudid 0.5 mg every 3 hours when necessary as well as Solu-Medrol which was given once, 125 mg. At the time of this evaluation his headache was moderately severe with a rating of 6/10. He also complained of tightness in tenderness at the base of his neck and in the frontal and temporal regions.  Past Medical History  Diagnosis Date  . Hx of colonic polyps   . Diverticulosis   . Hypertension   . 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 CAD (coronary artery disease)     minimal coronary plaque in the LAD and right coronary system. 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 7-06. Last nuclear stress 11-17-06 fixed anterior/ inferior defect, no inducible ischemia, EF 81%  . Allergic rhinitis   . Hx of colonoscopy   . Dysrhythmia 01-24-12    past hx. A.Fib x1 episode-responded to med.  . Depression   . GERD (gastroesophageal reflux disease)   . COPD (chronic  obstructive pulmonary disease)     pt denies this hx on 05/24/2013  . Osteoarthritis   . RA (rheumatoid arthritis)   . Chronic back pain     "all over back" (06/16/2013)  . PONV (postoperative nausea and vomiting)   . High cholesterol   . Sleep apnea     "never dx'd; snores bad" (06/16/2013)  . Daily headache   . Falls frequently     "since 02/2013" (06/16/2013)  . Anxiety   . Narcotic abuse     per family  . Alcoholism /alcohol abuse     per family  . Dementia     Past Surgical History  Procedure Laterality Date  . Appendectomy    . Total knee arthroplasty Right 2002     partial  . Shoulder hemi-arthroplasty Left 2008    partial  . Foot surgery Right 11-08  for removal of bone spurs-  . Ankle reconstruction Right 6-09    [facility]  . Posterior lumbar fusion  12-10    L4-5 diskectomy w/ fusion, cage placement and rods; [place]  . Partial knee arthroplasty Left   . Total shoulder replacement Right   . Joint replacement    . Cataract extraction, bilateral Bilateral 01-24-12  . Orif shoulder fracture  02/06/2012    Procedure: OPEN REDUCTION INTERNAL FIXATION (ORIF) SHOULDER FRACTURE;  Surgeon: Nita Sells, MD;  Location: [facility];  Service: Orthopedics;  Laterality: Left;  ORIF of a Left Shoulder Fracture with  Iliac Crest Bone Graft aspiration   . Harvest bone graft  02/06/2012    Procedure: HARVEST ILIAC BONE GRAFT;  Surgeon: Nita Sells, MD;  Location: [facility];  Service: Orthopedics;;  bone marrow aspirqation   . Hardware removal  03/09/2012    Procedure: HARDWARE REMOVAL;  Surgeon: Nita Sells, MD;  Location: [place];  Service: Orthopedics;  Laterality: Left;  Hardware Removal from Left Shoulder  . Shoulder arthroscopy Right 2013  . Carpal tunnel release Right 1990's  . Total ankle replacement Right 2008    at [facility]  . Hammer toe surgery Right 07/2012    "broke 4 hammertoes"   . Posterior lumbar fusion 4 level N/A 03/02/2013     Procedure: Lumbar One to Sacral One Posterior lumbar interbody fusion;  Surgeon: Floyce Stakes, MD;  Location: [place] [facility];  Service: Neurosurgery;  Laterality: N/A;  L1 to S1 Posterior lumbar interbody fusion  . Lumbar wound debridement N/A 03/17/2013    Procedure: Incision and drainage of superficial lumbar wound;  Surgeon: Floyce Stakes, MD;  Location: [place] [facility];  Service: Neurosurgery;  Laterality: N/A;  Incision and drainage of superficial lumbar wound  . Tonsillectomy    . Coronary angioplasty with stent placement  2006    "while repairing 1st stent, a second area tore and they had to place 2nd stent " ?LAD & CX  . Inguinal hernia repair Bilateral     Family History  Problem Relation Age of Onset  . Uterine cancer Mother     survivor  . Macular degeneration Mother   . Other Mother     ankle edema  . Lung cancer Mother   . Cancer Mother     ovarian, lung  . Coronary artery disease Father   . Hypertension Father   . Prostate cancer Father   . Colon polyps Father   . Cancer Father     prostate  . Thyroid disease Sister   . Heart attack Brother   . Hyperlipidemia Brother   . Other Brother     Schizophrenic  . Diabetes Neg Hx   . Colon cancer Neg Hx   . Esophageal cancer Neg Hx   . Rectal cancer Neg Hx   . Stomach cancer Neg Hx   . Coronary artery disease Maternal Aunt   . Heart attack Maternal Aunt   . Rheum arthritis Sister   . Hemochromatosis Sister     Social History:  reports that he has never smoked. He has never used smokeless tobacco. He reports that he drinks alcohol. He reports that he does not use illicit drugs.  Allergies  Allergen Reactions  . Morphine And Related Shortness Of Breath, Nausea And Vomiting, Swelling and Other (See Comments)    Agitation, tolerates dilaudid  . Penicillins Hives, Shortness Of Breath and Rash    REACTION: hives, breathing problems  .  Cefixime     rash  . Percocet [Oxycodone-Acetaminophen] Anxiety    Takes  OxyContin at home.    MEDICATIONS:                                                                                                                     I have reviewed the patient's current medications.   ROS:                                                                                                                                       History obtained from the patient  General ROS: negative for - chills, fatigue, fever, night sweats, weight gain or weight loss Psychological ROS: negative for - behavioral disorder, hallucinations, memory difficulties, mood swings or suicidal ideation Ophthalmic ROS: negative for - blurry vision, double vision, eye pain or loss of vision ENT ROS: negative for - epistaxis, nasal discharge, oral lesions, sore throat, tinnitus or vertigo Allergy and Immunology ROS: negative for - hives or itchy/watery eyes Hematological and Lymphatic ROS: negative for - bleeding problems, bruising or swollen lymph nodes Endocrine ROS: negative for - galactorrhea, hair pattern changes, polydipsia/polyuria or temperature intolerance Respiratory ROS: negative for - cough, hemoptysis, shortness of breath or wheezing Cardiovascular ROS: negative for - chest pain, dyspnea on exertion, edema or irregular heartbeat Gastrointestinal ROS: negative for - abdominal pain, diarrhea, hematemesis, nausea/vomiting or stool incontinence Genito-Urinary ROS: negative for - dysuria, hematuria, incontinence or urinary frequency/urgency Musculoskeletal ROS: Chronic joint and back pain with known degenerative arthritis and degenerative disc disease of lumbar spine Neurological ROS: as noted in HPI Dermatological ROS: negative for rash and skin lesion changes   Blood pressure 133/69, pulse 56, temperature 97.7 F (36.5 C), temperature source Oral, resp. rate 22, height 5' 6"  (1.676 m), weight 65.5 kg (144 lb 6.4 oz), SpO2 96.00%.   Neurologic Examination:  Mental Status: Alert, oriented, thought content appropriate.  Speech fluent without evidence of aphasia. Able to follow commands without difficulty. Cranial Nerves: II-Visual fields were normal. III/IV/VI-Pupils were equal and reacted. Extraocular movements were full and conjugate.    V/VII-no facial numbness and no facial weakness. VIII-normal. X-normal speech and symmetrical palatal movement. Motor: 5/5 bilaterally with normal tone and bulk Sensory: Normal throughout. Deep Tendon Reflexes: 1+ and symmetric. Plantars: Mute bilaterally Cerebellar: Normal finger-to-nose testing.  Lab Results  Component Value Date/Time   CHOL 100 10/15/2010  4:08 PM    Results for orders placed during the hospital encounter of 10/29/13 (from the past 48 hour(s))  CBC WITH DIFFERENTIAL     Status: Abnormal   Collection Time    10/29/13  5:23 PM      Result Value Ref Range   WBC 11.6 (*) 4.0 - 10.5 K/uL   RBC 4.59  4.22 - 5.81 MIL/uL   Hemoglobin 14.9  13.0 - 17.0 g/dL   HCT 43.1  39.0 - 52.0 %   MCV 93.9  78.0 - 100.0 fL   MCH 32.5  26.0 - 34.0 pg   MCHC 34.6  30.0 - 36.0 g/dL   RDW 13.0  11.5 - 15.5 %   Platelets 214  150 - 400 K/uL   Neutrophils Relative % 78 (*) 43 - 77 %   Neutro Abs 9.0 (*) 1.7 - 7.7 K/uL   Lymphocytes Relative 8 (*) 12 - 46 %   Lymphs Abs 0.9  0.7 - 4.0 K/uL   Monocytes Relative 14 (*) 3 - 12 %   Monocytes Absolute 1.7 (*) 0.1 - 1.0 K/uL   Eosinophils Relative 0  0 - 5 %   Eosinophils Absolute 0.0  0.0 - 0.7 K/uL   Basophils Relative 0  0 - 1 %   Basophils Absolute 0.0  0.0 - 0.1 K/uL  COMPREHENSIVE METABOLIC PANEL     Status: Abnormal   Collection Time    10/29/13  5:23 PM      Result Value Ref Range   Sodium 137  137 - 147 mEq/L   Potassium 4.4  3.7 - 5.3 mEq/L   Chloride 94 (*) 96 - 112 mEq/L   CO2 24  19 - 32 mEq/L   Glucose, Bld 154 (*) 70 - 99 mg/dL   BUN 22  6 - 23 mg/dL   Creatinine, Ser 0.61   0.50 - 1.35 mg/dL   Calcium 9.5  8.4 - 10.5 mg/dL   Total Protein 7.7  6.0 - 8.3 g/dL   Albumin 4.0  3.5 - 5.2 g/dL   AST 14  0 - 37 U/L   ALT 13  0 - 53 U/L   Alkaline Phosphatase 65  39 - 117 U/L   Total Bilirubin 0.9  0.3 - 1.2 mg/dL   GFR calc non Af Amer >90  >90 mL/min   GFR calc Af Amer >90  >90 mL/min   Comment: (NOTE)     The eGFR has been calculated using the CKD EPI equation.     This calculation has not been validated in all clinical situations.     eGFR's persistently <90 mL/min signify possible Chronic Kidney     Disease.   Anion gap 19 (*) 5 - 15  URINALYSIS, ROUTINE W REFLEX MICROSCOPIC     Status: Abnormal   Collection Time    10/29/13  9:54 PM      Result Value Ref Range   Color, Urine  AMBER (*) YELLOW   Comment: BIOCHEMICALS MAY BE AFFECTED BY COLOR   APPearance CLOUDY (*) CLEAR   Specific Gravity, Urine 1.024  1.005 - 1.030   pH 5.5  5.0 - 8.0   Glucose, UA NEGATIVE  NEGATIVE mg/dL   Hgb urine dipstick TRACE (*) NEGATIVE   Bilirubin Urine SMALL (*) NEGATIVE   Ketones, ur 15 (*) NEGATIVE mg/dL   Protein, ur NEGATIVE  NEGATIVE mg/dL   Urobilinogen, UA 1.0  0.0 - 1.0 mg/dL   Nitrite NEGATIVE  NEGATIVE   Leukocytes, UA NEGATIVE  NEGATIVE  URINE MICROSCOPIC-ADD ON     Status: Abnormal   Collection Time    10/29/13  9:54 PM      Result Value Ref Range   Squamous Epithelial / LPF RARE  RARE   RBC / HPF 0-2  <3 RBC/hpf   Bacteria, UA RARE  RARE   Casts HYALINE CASTS (*) NEGATIVE  URINE RAPID DRUG SCREEN (HOSP PERFORMED)     Status: Abnormal   Collection Time    10/29/13  9:54 PM      Result Value Ref Range   Opiates NONE DETECTED  NONE DETECTED   Cocaine NONE DETECTED  NONE DETECTED   Benzodiazepines NONE DETECTED  NONE DETECTED   Amphetamines NONE DETECTED  NONE DETECTED   Tetrahydrocannabinol POSITIVE (*) NONE DETECTED   Barbiturates NONE DETECTED  NONE DETECTED   Comment:            DRUG SCREEN FOR MEDICAL PURPOSES     ONLY.  IF CONFIRMATION IS  NEEDED     FOR ANY PURPOSE, NOTIFY LAB     WITHIN 5 DAYS.                LOWEST DETECTABLE LIMITS     FOR URINE DRUG SCREEN     Drug Class       Cutoff (ng/mL)     Amphetamine      1000     Barbiturate      200     Benzodiazepine   333     Tricyclics       545     Opiates          300     Cocaine          300     THC              50  CBC     Status: Abnormal   Collection Time    10/30/13  5:01 AM      Result Value Ref Range   WBC 9.7  4.0 - 10.5 K/uL   RBC 3.77 (*) 4.22 - 5.81 MIL/uL   Hemoglobin 12.0 (*) 13.0 - 17.0 g/dL   HCT 35.6 (*) 39.0 - 52.0 %   MCV 94.4  78.0 - 100.0 fL   MCH 31.8  26.0 - 34.0 pg   MCHC 33.7  30.0 - 36.0 g/dL   RDW 13.3  11.5 - 15.5 %   Platelets 184  150 - 400 K/uL  CREATININE, SERUM     Status: None   Collection Time    10/30/13  5:01 AM      Result Value Ref Range   Creatinine, Ser 0.60  0.50 - 1.35 mg/dL   GFR calc non Af Amer >90  >90 mL/min   GFR calc Af Amer >90  >90 mL/min   Comment: (NOTE)     The eGFR has been calculated using  the CKD EPI equation.     This calculation has not been validated in all clinical situations.     eGFR's persistently <90 mL/min signify possible Chronic Kidney     Disease.  COMPREHENSIVE METABOLIC PANEL     Status: Abnormal   Collection Time    10/30/13  5:01 AM      Result Value Ref Range   Sodium 136 (*) 137 - 147 mEq/L   Potassium 3.7  3.7 - 5.3 mEq/L   Chloride 98  96 - 112 mEq/L   CO2 26  19 - 32 mEq/L   Glucose, Bld 110 (*) 70 - 99 mg/dL   BUN 21  6 - 23 mg/dL   Creatinine, Ser 0.58  0.50 - 1.35 mg/dL   Calcium 8.4  8.4 - 10.5 mg/dL   Total Protein 6.0  6.0 - 8.3 g/dL   Albumin 3.0 (*) 3.5 - 5.2 g/dL   AST 8  0 - 37 U/L   ALT 9  0 - 53 U/L   Alkaline Phosphatase 50  39 - 117 U/L   Total Bilirubin 0.7  0.3 - 1.2 mg/dL   GFR calc non Af Amer >90  >90 mL/min   GFR calc Af Amer >90  >90 mL/min   Comment: (NOTE)     The eGFR has been calculated using the CKD EPI equation.     This calculation has  not been validated in all clinical situations.     eGFR's persistently <90 mL/min signify possible Chronic Kidney     Disease.   Anion gap 12  5 - 15  CBC WITH DIFFERENTIAL     Status: Abnormal   Collection Time    10/30/13  5:01 AM      Result Value Ref Range   WBC 9.7  4.0 - 10.5 K/uL   RBC 3.77 (*) 4.22 - 5.81 MIL/uL   Hemoglobin 11.8 (*) 13.0 - 17.0 g/dL   Comment: REPEATED TO VERIFY   HCT 35.6 (*) 39.0 - 52.0 %   MCV 94.4  78.0 - 100.0 fL   MCH 31.3  26.0 - 34.0 pg   MCHC 33.1  30.0 - 36.0 g/dL   RDW 13.3  11.5 - 15.5 %   Platelets 183  150 - 400 K/uL   Neutrophils Relative % 60  43 - 77 %   Neutro Abs 5.8  1.7 - 7.7 K/uL   Lymphocytes Relative 21  12 - 46 %   Lymphs Abs 2.0  0.7 - 4.0 K/uL   Monocytes Relative 20 (*) 3 - 12 %   Monocytes Absolute 1.9 (*) 0.1 - 1.0 K/uL   Eosinophils Relative 0  0 - 5 %   Eosinophils Absolute 0.0  0.0 - 0.7 K/uL   Basophils Relative 0  0 - 1 %   Basophils Absolute 0.0  0.0 - 0.1 K/uL    Dg Chest 2 View  10/29/2013   CLINICAL DATA:  Headache with nausea and dizziness.  EXAM: CHEST  2 VIEW  COMPARISON:  06/30/2013  FINDINGS: Lung volumes are low. Stable asymmetric elevation of the right hemidiaphragm. There is bibasilar atelectasis. No edema or focal airspace consolidation. No evidence for pleural effusion. Patient is status post bilateral shoulder replacement. Lumbar fusion hardware is incompletely visualized.  IMPRESSION: Bibasilar atelectasis.  No edema or focal airspace consolidation.   Electronically Signed   By: Misty Stanley M.D.   On: 10/29/2013 21:19   Ct Head Wo Contrast  10/30/2013  CLINICAL DATA:  Severe headache.  EXAM: CT HEAD WITHOUT CONTRAST  TECHNIQUE: Contiguous axial images were obtained from the base of the skull through the vertex without intravenous contrast.  COMPARISON:  Head CT 06/30/2013.  FINDINGS: Patchy and confluent areas of decreased attenuation are noted throughout the deep and periventricular white matter of the  cerebral hemispheres bilaterally, compatible with chronic microvascular ischemic disease. No acute intracranial abnormalities. Specifically, no evidence of acute intracranial hemorrhage, no definite findings of acute/subacute cerebral ischemia, no mass, mass effect, hydrocephalus or abnormal intra or extra-axial fluid collections. Visualized paranasal sinuses and mastoids are well pneumatized. No acute displaced skull fractures are identified.  IMPRESSION: 1. No acute intracranial abnormalities. 2. Chronic microvascular ischemic changes in the cerebral white matter, as above.   Electronically Signed   By: Vinnie Langton M.D.   On: 10/30/2013 11:22    Assessment/Plan: Recurrent intractable severe headache with features of migraine headache as well as muscle contraction (attention) likely contributing to continued head and neck pain. Exam showed no abnormalities and CT scan of his head at the time of admission was unremarkable.  Recommendations: 1. No change in current analgesic medications with hydromorphone 0.5 mg when necessary. 2. Will add Robaxin 500 mg 4 times a day scheduled for now. 3. No clinical indication for lumbar puncture at this point.  We will continue to follow this patient closely with you.  C.R. Nicole Kindred, MD [facility] [phone]  10/30/2013, 5:32 PM

## 2013-10-30 NOTE — Care Management Utilization Note (Signed)
UR completed.    Lionel December, RN, BSN Phone [phone]

## 2013-10-31 LAB — COMPREHENSIVE METABOLIC PANEL
ALT: 9 U/L (ref 0–53)
AST: 10 U/L (ref 0–37)
Albumin: 2.7 g/dL — ABNORMAL LOW (ref 3.5–5.2)
Alkaline Phosphatase: 47 U/L (ref 39–117)
Anion gap: 9 (ref 5–15)
BUN: 19 mg/dL (ref 6–23)
CO2: 25 mEq/L (ref 19–32)
Calcium: 8.3 mg/dL — ABNORMAL LOW (ref 8.4–10.5)
Chloride: 98 mEq/L (ref 96–112)
Creatinine, Ser: 0.64 mg/dL (ref 0.50–1.35)
GFR calc Af Amer: >90 mL/min (ref >90)
GFR calc non Af Amer: >90 mL/min (ref >90)
Glucose, Bld: 162 mg/dL — ABNORMAL HIGH (ref 70–99)
Potassium: 4.9 mEq/L (ref 3.7–5.3)
Sodium: 132 mEq/L — ABNORMAL LOW (ref 137–147)
Total Bilirubin: 0.3 mg/dL (ref 0.3–1.2)
Total Protein: 5.6 g/dL — ABNORMAL LOW (ref 6.0–8.3)

## 2013-10-31 LAB — CBC WITH DIFFERENTIAL/PLATELET
Basophils Absolute: 0 10*3/uL (ref 0.0–0.1)
Basophils Relative: 0 % (ref 0–1)
Eosinophils Absolute: 0 10*3/uL (ref 0.0–0.7)
Eosinophils Relative: 0 % (ref 0–5)
HCT: 34.2 % — ABNORMAL LOW (ref 39.0–52.0)
Hemoglobin: 11.4 g/dL — ABNORMAL LOW (ref 13.0–17.0)
Lymphocytes Relative: 14 % (ref 12–46)
Lymphs Abs: 1.1 10*3/uL (ref 0.7–4.0)
MCH: 31.4 pg (ref 26.0–34.0)
MCHC: 33.3 g/dL (ref 30.0–36.0)
MCV: 94.2 fL (ref 78.0–100.0)
Monocytes Absolute: 1 10*3/uL (ref 0.1–1.0)
Monocytes Relative: 12 % (ref 3–12)
Neutro Abs: 5.8 10*3/uL (ref 1.7–7.7)
Neutrophils Relative %: 74 % (ref 43–77)
Platelets: 186 10*3/uL (ref 150–400)
RBC: 3.63 MIL/uL — ABNORMAL LOW (ref 4.22–5.81)
RDW: 13.1 % (ref 11.5–15.5)
WBC: 7.9 10*3/uL (ref 4.0–10.5)

## 2013-10-31 LAB — URINE CULTURE: Colony Count: 3000

## 2013-10-31 NOTE — Progress Notes (Signed)
Utilization review completed.  

## 2013-10-31 NOTE — Progress Notes (Signed)
[facility] PROGRESS NOTE  QUARAN KEDZIERSKI XLK:[id number] DOB: Oct 23, 1945 DOA: 10/29/2013 PCP: [name], MD  Assessment/Plan: Active Problems:   Headache   Leukocytosis   Intractable headache Severe recurrent intractable headache in patient with chronic recurrent headaches. Patient's symptoms have improved significantly since admission. He still has a headache of moderate severity.  Recommend no changes in current management of unchanging Robaxin to when necessary rather than scheduled. Continue Robaxin, Depakote  Leukocytosis No focal signs of infection Patient also received Solu-Medrol yesterday  CAD/HTN: no active CP currently. Hemodynamically stable. Continue home regimen.  RA: continue humira and prednisone.  Chronic Pain: low dose morphine overnight. Continue neurontin.  FEN/GI: heart healthy diet.  Prophylaxis: sub q heparin     Code Status: full Family Communication: family updated about patient's clinical progress Disposition Plan: Discharge home in a.m.   Brief narrative: This is a 68 y.o. year old male with significant past medical history of degenerative arthritis of the spine s/p lumbar disc surgery with secondary discitis, chronic headache, CAD, chronic pain, RA, presenting with headache. Pt states that pt has had severe recurrent headaches since 05/2013 in lieu of post operative wound for lumbar surgery. Pt has been seen by outpt neurology as well as headache specialist. Was place on depakote prophylactically, with imitrex for breakthrough flares. Pt states that he has recurrent headaches on a weekly basis that improve mildly with imitrex. Has had severe flare of HA over past 1-2 days. Denies any fevers or chills. No nuchal rigidity. HA is in its usual distribution, with posterior headache and R sided frontal/retro-orbital distibution. + photophobia. Does have aura at times.  Presented to ER today with worsening pain. tmax 99.8, HR mainly in 80s, BP  [id number] systolic, Satting 74-25% on RA. WBC 11.6, Hgb 14.9, na 137, Cr 0.6. CXR with bibasilar atelectasis. No acute findings. UA not indicative of infection.    Consultants:  Neurology  Procedures:  None  Antibiotics:  None  HPI/Subjective: Patient had no new complaints. No overnight adverse events reported. Headache severity is less today, and neck pain and tenderness improved. Headache severity is down to 4/10.   Objective: Filed Vitals:   10/30/13 2016 10/30/13 2236 10/31/13 0512 10/31/13 0927  BP: 114/75 106/64 105/57 125/66  Pulse: 71 67 60 67  Temp: 97.9 F (36.6 C)  97.7 F (36.5 C)   TempSrc: Oral  Oral   Resp: 19  18   Height:      Weight:      SpO2: 90%  95%     Intake/Output Summary (Last 24 hours) at 10/31/13 1110 Last data filed at 10/31/13 0900  Gross per 24 hour  Intake   2670 ml  Output    890 ml  Net   1780 ml    Exam:  General: alert and cooperative  HEENT: PERRLA, extra ocular movement intact and + mild photophobia  Heart: S1, S2 normal, no murmur, rub or gallop, regular rate and rhythm  Lungs: clear to auscultation, no wheezes or rales and unlabored breathing  Abdomen: abdomen is soft without significant tenderness, masses, organomegaly or guarding  Extremities: extremities normal, atraumatic, no cyanosis or edema, noted post operative changes on lumbar spine-wound well healed.  Skin:no rashes, no ecchymoses  Neurology: normal without focal findings and kernig and brudzinski negative     Data Reviewed: Basic Metabolic Panel:  Recent Labs Lab 10/29/13 1723 10/30/13 0501 10/31/13 0417  NA 137 136* 132*  K 4.4 3.7 4.9  CL 94* 98  98  CO2 24 26 25   GLUCOSE 154* 110* 162*  BUN 22 21 19   CREATININE 0.61 0.60  0.58 0.64  CALCIUM 9.5 8.4 8.3*    Liver Function Tests:  Recent Labs Lab 10/29/13 1723 10/30/13 0501 10/31/13 0417  AST 14 8 10   ALT 13 9 9   ALKPHOS 65 50 47  BILITOT 0.9 0.7 0.3  PROT 7.7 6.0 5.6*  ALBUMIN  4.0 3.0* 2.7*   No results found for this basename: LIPASE, AMYLASE,  in the last 168 hours No results found for this basename: AMMONIA,  in the last 168 hours  CBC:  Recent Labs Lab 10/29/13 1723 10/30/13 0501 10/31/13 0417  WBC 11.6* 9.7  9.7 7.9  NEUTROABS 9.0* 5.8 5.8  HGB 14.9 12.0*  11.8* 11.4*  HCT 43.1 35.6*  35.6* 34.2*  MCV 93.9 94.4  94.4 94.2  PLT 214 184  183 186    Cardiac Enzymes: No results found for this basename: CKTOTAL, CKMB, CKMBINDEX, TROPONINI,  in the last 168 hours BNP (last 3 results)  Recent Labs  05/06/13 1321  PROBNP 306.1*     CBG: No results found for this basename: GLUCAP,  in the last 168 hours  No results found for this or any previous visit (from the past 240 hour(s)).   Studies: Dg Chest 2 View  10/29/2013   CLINICAL DATA:  Headache with nausea and dizziness.  EXAM: CHEST  2 VIEW  COMPARISON:  06/30/2013  FINDINGS: Lung volumes are low. Stable asymmetric elevation of the right hemidiaphragm. There is bibasilar atelectasis. No edema or focal airspace consolidation. No evidence for pleural effusion. Patient is status post bilateral shoulder replacement. Lumbar fusion hardware is incompletely visualized.  IMPRESSION: Bibasilar atelectasis.  No edema or focal airspace consolidation.   Electronically Signed   By: Misty Stanley M.D.   On: 10/29/2013 21:19   Ct Head Wo Contrast  10/30/2013   CLINICAL DATA:  Severe headache.  EXAM: CT HEAD WITHOUT CONTRAST  TECHNIQUE: Contiguous axial images were obtained from the base of the skull through the vertex without intravenous contrast.  COMPARISON:  Head CT 06/30/2013.  FINDINGS: Patchy and confluent areas of decreased attenuation are noted throughout the deep and periventricular white matter of the cerebral hemispheres bilaterally, compatible with chronic microvascular ischemic disease. No acute intracranial abnormalities. Specifically, no evidence of acute intracranial hemorrhage, no definite  findings of acute/subacute cerebral ischemia, no mass, mass effect, hydrocephalus or abnormal intra or extra-axial fluid collections. Visualized paranasal sinuses and mastoids are well pneumatized. No acute displaced skull fractures are identified.  IMPRESSION: 1. No acute intracranial abnormalities. 2. Chronic microvascular ischemic changes in the cerebral white matter, as above.   Electronically Signed   By: Vinnie Langton M.D.   On: 10/30/2013 11:22    Scheduled Meds: . aspirin EC  81 mg Oral q morning - 10a  . cholecalciferol  1,000 Units Oral Daily  . divalproex  500 mg Oral QHS  . FLUoxetine  40 mg Oral Daily  . gabapentin  300 mg Oral TID  . heparin  5,000 Units Subcutaneous 3 times per day  . hydrochlorothiazide  12.5 mg Oral Daily  . losartan  50 mg Oral Daily  . methocarbamol  500 mg Oral 4 times per day  . metoprolol tartrate  25 mg Oral BID  . pantoprazole  40 mg Oral BID  . potassium chloride SA  20 mEq Oral BID  . predniSONE  5 mg Oral QPM  . simvastatin  20 mg Oral QHS  . sodium chloride  3 mL Intravenous Q12H   Continuous Infusions: . sodium chloride 100 mL/hr at 10/31/13 0600    Active Problems:   Headache   Leukocytosis    Time spent: 40 minutes   [facility] Pager [phone]. If 8PM-8AM, please contact night-coverage at [email or web address], password [facility] 10/31/2013, 11:10 AM  LOS: 2 days

## 2013-10-31 NOTE — Progress Notes (Addendum)
Subjective: Patient had no new complaints. No overnight adverse events reported. Headache severity is less today, and neck pain and tenderness improved. Headache severity is down to 4/10.  Objective: Current vital signs: BP 105/57  Pulse 60  Temp(Src) 97.7 F (36.5 C) (Oral)  Resp 18  Ht 5' 6"  (1.676 m)  Wt 65.5 kg (144 lb 6.4 oz)  BMI 23.32 kg/m2  SpO2 95%  Neurologic Exam: Alert and in no acute distress. Mental status was normal. Speech was normal.  Medications: I have reviewed the patient's current medications.  Assessment/Plan: Severe recurrent intractable headache in patient with chronic recurrent headaches. Patient's symptoms have improved significantly since admission. He still has a headache of moderate severity.  Recommend no changes in current management except changing Robaxin to when necessary rather than scheduled.  We will continue to follow this patient with you.  C.R. Nicole Kindred, MD [facility] [phone]  10/31/2013  8:53 AM

## 2013-11-01 LAB — COMPREHENSIVE METABOLIC PANEL
ALT: 10 U/L (ref 0–53)
AST: 9 U/L (ref 0–37)
Albumin: 2.6 g/dL — ABNORMAL LOW (ref 3.5–5.2)
Alkaline Phosphatase: 44 U/L (ref 39–117)
Anion gap: 7 (ref 5–15)
BUN: 12 mg/dL (ref 6–23)
CO2: 29 mEq/L (ref 19–32)
Calcium: 8.2 mg/dL — ABNORMAL LOW (ref 8.4–10.5)
Chloride: 100 mEq/L (ref 96–112)
Creatinine, Ser: 0.64 mg/dL (ref 0.50–1.35)
GFR calc Af Amer: >90 mL/min (ref >90)
GFR calc non Af Amer: >90 mL/min (ref >90)
Glucose, Bld: 81 mg/dL (ref 70–99)
Potassium: 4.4 mEq/L (ref 3.7–5.3)
Sodium: 136 mEq/L — ABNORMAL LOW (ref 137–147)
Total Bilirubin: 0.4 mg/dL (ref 0.3–1.2)
Total Protein: 5.3 g/dL — ABNORMAL LOW (ref 6.0–8.3)

## 2013-11-01 LAB — CBC WITH DIFFERENTIAL/PLATELET
Basophils Absolute: 0 10*3/uL (ref 0.0–0.1)
Basophils Relative: 0 % (ref 0–1)
Eosinophils Absolute: 0.1 10*3/uL (ref 0.0–0.7)
Eosinophils Relative: 1 % (ref 0–5)
HCT: 35 % — ABNORMAL LOW (ref 39.0–52.0)
Hemoglobin: 11.7 g/dL — ABNORMAL LOW (ref 13.0–17.0)
Lymphocytes Relative: 47 % — ABNORMAL HIGH (ref 12–46)
Lymphs Abs: 3.1 10*3/uL (ref 0.7–4.0)
MCH: 31.5 pg (ref 26.0–34.0)
MCHC: 33.4 g/dL (ref 30.0–36.0)
MCV: 94.3 fL (ref 78.0–100.0)
Monocytes Absolute: 0.6 10*3/uL (ref 0.1–1.0)
Monocytes Relative: 9 % (ref 3–12)
Neutro Abs: 2.8 10*3/uL (ref 1.7–7.7)
Neutrophils Relative %: 43 % (ref 43–77)
Platelets: 193 10*3/uL (ref 150–400)
RBC: 3.71 MIL/uL — ABNORMAL LOW (ref 4.22–5.81)
RDW: 13.2 % (ref 11.5–15.5)
WBC: 6.5 10*3/uL (ref 4.0–10.5)

## 2013-11-01 MED ORDER — DIVALPROEX SODIUM ER 500 MG PO TB24: 500.0000 mg | ORAL_TABLET | Freq: Every day | ORAL | Status: DC

## 2013-11-01 MED ORDER — METHOCARBAMOL 500 MG PO TABS: 500.0000 mg | ORAL_TABLET | Freq: Three times a day (TID) | ORAL | Status: DC | PRN

## 2013-11-01 NOTE — Progress Notes (Signed)
Patient discharge teaching given, including activity, diet, follow-up appoints, and medications. Patient verbalized understanding of all discharge instructions. IV access was d/c'd. Vitals are stable. Skin is intact except as charted in most recent assessments. Pt to be escorted out by NT, to be driven home by family. 

## 2013-11-01 NOTE — Progress Notes (Signed)
Subjective: HA is now a 1/10 and feeling much better.   Objective: Current vital signs: BP 156/86  Pulse 63  Temp(Src) 98.3 F (36.8 C) (Oral)  Resp 20  Ht 5' 6"  (1.676 m)  Wt 65.5 kg (144 lb 6.4 oz)  BMI 23.32 kg/m2  SpO2 94% Vital signs in last 24 hours: Temp:  [97.4 F (36.3 C)-98.4 F (36.9 C)] 98.3 F (36.8 C) (07/27 0510) Pulse Rate:  [61-66] 63 (07/27 0510) Resp:  [16-20] 20 (07/27 0510) BP: (107-156)/(60-86) 156/86 mmHg (07/27 0510) SpO2:  [93 %-95 %] 94 % (07/27 0510)  Intake/Output from previous day: 07/26 0701 - 07/27 0700 In: 3078.3 [P.O.:800; I.V.:2278.3] Out: 2790 [Urine:2790] Intake/Output this shift:   Nutritional status: Cardiac  Neurologic Exam:  Mental Status: Alert, oriented, thought content appropriate.  Speech fluent without evidence of aphasia.  Able to follow 3 step commands without difficulty. Cranial Nerves: II: Visual fields grossly normal, pupils equal, round, reactive to light and accommodation III,IV, VI: ptosis not present, extra-ocular motions intact bilaterally V,VII: smile symmetric, facial light touch sensation normal bilaterally VIII: hearing normal bilaterally IX,X: gag reflex present XI: bilateral shoulder shrug XII: midline tongue extension without atrophy or fasciculations  Motor: Right : Upper extremity   5/5    Left:     Upper extremity   5/5  Lower extremity   5/5     Lower extremity   5/5 Tone and bulk:normal tone throughout; no atrophy noted Sensory: Pinprick and light touch intact throughout, bilaterally Deep Tendon Reflexes:  Right: Upper Extremity   Left: Upper extremity   biceps (C-5 to C-6) 2/4   biceps (C-5 to C-6) 2/4 tricep (C7) 2/4    triceps (C7) 2/4 Brachioradialis (C6) 2/4  Brachioradialis (C6) 2/4  Lower Extremity Lower Extremity  quadriceps (L-2 to L-4) 2/4   quadriceps (L-2 to L-4) 2/4 Achilles (S1) 2/4   Achilles (S1) 2/4  Plantars: Right: downgoing   Left: downgoing Cerebellar: normal  finger-to-nose,  normal heel-to-shin test    Lab Results: Basic Metabolic Panel:  Recent Labs Lab 10/29/13 1723 10/30/13 0501 10/31/13 0417 11/01/13 0605  NA 137 136* 132* 136*  K 4.4 3.7 4.9 4.4  CL 94* 98 98 100  CO2 24 26 25 29   GLUCOSE 154* 110* 162* 81  BUN 22 21 19 12   CREATININE 0.61 0.60  0.58 0.64 0.64  CALCIUM 9.5 8.4 8.3* 8.2*    Liver Function Tests:  Recent Labs Lab 10/29/13 1723 10/30/13 0501 10/31/13 0417 11/01/13 0605  AST 14 8 10 9   ALT 13 9 9 10   ALKPHOS 65 50 47 44  BILITOT 0.9 0.7 0.3 0.4  PROT 7.7 6.0 5.6* 5.3*  ALBUMIN 4.0 3.0* 2.7* 2.6*   No results found for this basename: LIPASE, AMYLASE,  in the last 168 hours No results found for this basename: AMMONIA,  in the last 168 hours  CBC:  Recent Labs Lab 10/29/13 1723 10/30/13 0501 10/31/13 0417 11/01/13 0605  WBC 11.6* 9.7  9.7 7.9 6.5  NEUTROABS 9.0* 5.8 5.8 2.8  HGB 14.9 12.0*  11.8* 11.4* 11.7*  HCT 43.1 35.6*  35.6* 34.2* 35.0*  MCV 93.9 94.4  94.4 94.2 94.3  PLT 214 184  183 186 193    Cardiac Enzymes: No results found for this basename: CKTOTAL, CKMB, CKMBINDEX, TROPONINI,  in the last 168 hours  Lipid Panel: No results found for this basename: CHOL, TRIG, HDL, CHOLHDL, VLDL, LDLCALC,  in the last 168 hours  CBG:  No results found for this basename: GLUCAP,  in the last 168 hours  Microbiology: Results for orders placed during the hospital encounter of 10/29/13  CULTURE, BLOOD (ROUTINE X 2)     Status: None   Collection Time    10/29/13  8:30 PM      Result Value Ref Range Status   Specimen Description BLOOD ARM RIGHT   Final   Special Requests BOTTLES DRAWN AEROBIC AND ANAEROBIC [facility]   Final   Culture  Setup Time     Final   Value: 10/30/2013 05:11     Performed at [facility]   Culture     Final   Value:        BLOOD CULTURE RECEIVED NO GROWTH TO DATE CULTURE WILL BE HELD FOR 5 DAYS BEFORE ISSUING A FINAL NEGATIVE REPORT     Performed at [facility]   Report Status PENDING   Incomplete  CULTURE, BLOOD (ROUTINE X 2)     Status: None   Collection Time    10/29/13  8:37 PM      Result Value Ref Range Status   Specimen Description BLOOD ARM LEFT   Final   Special Requests BOTTLES DRAWN AEROBIC AND ANAEROBIC 10CC   Final   Culture  Setup Time     Final   Value: 10/30/2013 05:11     Performed at [facility]   Culture     Final   Value:        BLOOD CULTURE RECEIVED NO GROWTH TO DATE CULTURE WILL BE HELD FOR 5 DAYS BEFORE ISSUING A FINAL NEGATIVE REPORT     Performed at [facility]   Report Status PENDING   Incomplete  URINE CULTURE     Status: None   Collection Time    10/30/13  4:33 PM      Result Value Ref Range Status   Specimen Description URINE, CLEAN CATCH   Final   Special Requests NONE   Final   Culture  Setup Time     Final   Value: 10/30/2013 21:12     Performed at [place]     Final   Value: 3,000 COLONIES/ML     Performed at [facility]   Culture     Final   Value: INSIGNIFICANT GROWTH     Performed at [facility]   Report Status 10/31/2013 FINAL   Final    Coagulation Studies: No results found for this basename: LABPROT, INR,  in the last 72 hours  Imaging: Ct Head Wo Contrast  10/30/2013   CLINICAL DATA:  Severe headache.  EXAM: CT HEAD WITHOUT CONTRAST  TECHNIQUE: Contiguous axial images were obtained from the base of the skull through the vertex without intravenous contrast.  COMPARISON:  Head CT 06/30/2013.  FINDINGS: Patchy and confluent areas of decreased attenuation are noted throughout the deep and periventricular white matter of the cerebral hemispheres bilaterally, compatible with chronic microvascular ischemic disease. No acute intracranial abnormalities. Specifically, no evidence of acute intracranial hemorrhage, no definite findings of acute/subacute cerebral ischemia, no mass, mass effect, hydrocephalus or abnormal intra or  extra-axial fluid collections. Visualized paranasal sinuses and mastoids are well pneumatized. No acute displaced skull fractures are identified.  IMPRESSION: 1. No acute intracranial abnormalities. 2. Chronic microvascular ischemic changes in the cerebral white matter, as above.   Electronically Signed   By: Vinnie Langton M.D.   On: 10/30/2013 11:22  Medications:  Scheduled: . aspirin EC  81 mg Oral q morning - 10a  . cholecalciferol  1,000 Units Oral Daily  . divalproex  500 mg Oral QHS  . FLUoxetine  40 mg Oral Daily  . gabapentin  300 mg Oral TID  . heparin  5,000 Units Subcutaneous 3 times per day  . hydrochlorothiazide  12.5 mg Oral Daily  . losartan  50 mg Oral Daily  . methocarbamol  500 mg Oral 4 times per day  . metoprolol tartrate  25 mg Oral BID  . pantoprazole  40 mg Oral BID  . potassium chloride SA  20 mEq Oral BID  . predniSONE  5 mg Oral QPM  . simvastatin  20 mg Oral QHS  . sodium chloride  3 mL Intravenous Q12H    Assessment/Plan:  Recurrent intractable headache in patient with chronic recurrent headaches. HA now resolved.  Robaxin has been of significant help. Continue Robaxin as out patient on PRN basis.  Follow up with primary neurology. S/O   Etta Quill PA-C [facility] [phone]  11/01/2013, 9:44 AM

## 2013-11-01 NOTE — Discharge Summary (Addendum)
Physician Discharge Summary  ALOYSIUS HEINLE MRN: [id number] DOB/AGE: [birth year] 68 y.o.  PCP: [name], MD   Admit date: 10/29/2013 Discharge date: 11/01/2013  Discharge Diagnoses:  Recurrent intractable severe headache  Rheumatoid arthritis Coronary artery disease  Follow recommendations Patient has followup with neurology for chronic headaches      Medication List    STOP taking these medications       naproxen sodium 220 MG tablet  Commonly known as:  ANAPROX      TAKE these medications       aspirin 81 MG EC tablet  Commonly known as:  aspirin EC  Take 1 tablet (81 mg total) by mouth every morning.     cholecalciferol 1000 UNITS tablet  Commonly known as:  VITAMIN D  Take 1 tablet (1,000 Units total) by mouth daily.     divalproex 500 MG 24 hr tablet  Commonly known as:  DEPAKOTE ER  Take 1 tablet (500 mg total) by mouth at bedtime.     FLUoxetine 20 MG capsule  Commonly known as:  PROZAC  Take 40 mg by mouth daily.     gabapentin 300 MG capsule  Commonly known as:  NEURONTIN  Take 1 capsule (300 mg total) by mouth 3 (three) times daily.     HUMIRA PEN [place]  Inject 40 mg into the skin every 14 (fourteen) days. Every other saturday     losartan-hydrochlorothiazide 50-12.5 MG per tablet  Commonly known as:  HYZAAR  Take 1 tablet by mouth daily.     methocarbamol 500 MG tablet  Commonly known as:  ROBAXIN  Take 1 tablet (500 mg total) by mouth every 8 (eight) hours as needed for muscle spasms.     metoprolol tartrate 25 MG tablet  Commonly known as:  LOPRESSOR  Take 1 tablet (25 mg total) by mouth 2 (two) times daily.     pantoprazole 20 MG tablet  Commonly known as:  PROTONIX  Take 2 tablets (40 mg total) by mouth 2 (two) times daily.     potassium chloride SA 20 MEQ tablet  Commonly known as:  KLOR-CON M20  Take 1 tablet (20 mEq total) by mouth 2 (two) times daily.     predniSONE 5 MG tablet  Commonly known as:  DELTASONE   Take 1 tablet (5 mg total) by mouth every evening.     simvastatin 20 MG tablet  Commonly known as:  ZOCOR  Take 1 tablet (20 mg total) by mouth at bedtime.     sodium chloride 0.9 % SOLN 100 mL with valproate 500 MG/5ML SOLN 500 mg  Inject 500 mg into the vein once.     SUMAtriptan 100 MG tablet  Commonly known as:  IMITREX  Take 100 mg by mouth See admin instructions. TAKE 1 TABLET BY MOUTH EVERY 2 HOURS AS NEEDED FOR MIGRAINE/HEADACHE REPEAT IN 2 HOURS IF PRESIST        Discharge Condition: Stable  Disposition: 01-Home or Self Care   Consults: Neurology    Significant Diagnostic Studies: Dg Chest 2 View  10/29/2013   CLINICAL DATA:  Headache with nausea and dizziness.  EXAM: CHEST  2 VIEW  COMPARISON:  06/30/2013  FINDINGS: Lung volumes are low. Stable asymmetric elevation of the right hemidiaphragm. There is bibasilar atelectasis. No edema or focal airspace consolidation. No evidence for pleural effusion. Patient is status post bilateral shoulder replacement. Lumbar fusion hardware is incompletely visualized.  IMPRESSION: Bibasilar atelectasis.  No edema  or focal airspace consolidation.   Electronically Signed   By: Misty Stanley M.D.   On: 10/29/2013 21:19   Ct Head Wo Contrast  10/30/2013   CLINICAL DATA:  Severe headache.  EXAM: CT HEAD WITHOUT CONTRAST  TECHNIQUE: Contiguous axial images were obtained from the base of the skull through the vertex without intravenous contrast.  COMPARISON:  Head CT 06/30/2013.  FINDINGS: Patchy and confluent areas of decreased attenuation are noted throughout the deep and periventricular white matter of the cerebral hemispheres bilaterally, compatible with chronic microvascular ischemic disease. No acute intracranial abnormalities. Specifically, no evidence of acute intracranial hemorrhage, no definite findings of acute/subacute cerebral ischemia, no mass, mass effect, hydrocephalus or abnormal intra or extra-axial fluid collections. Visualized  paranasal sinuses and mastoids are well pneumatized. No acute displaced skull fractures are identified.  IMPRESSION: 1. No acute intracranial abnormalities. 2. Chronic microvascular ischemic changes in the cerebral white matter, as above.   Electronically Signed   By: Vinnie Langton M.D.   On: 10/30/2013 11:22       Microbiology: Recent Results (from the past 240 hour(s))  CULTURE, BLOOD (ROUTINE X 2)     Status: None   Collection Time    10/29/13  8:30 PM      Result Value Ref Range Status   Specimen Description BLOOD ARM RIGHT   Final   Special Requests BOTTLES DRAWN AEROBIC AND ANAEROBIC [facility]   Final   Culture  Setup Time     Final   Value: 10/30/2013 05:11     Performed at [facility]   Culture     Final   Value:        BLOOD CULTURE RECEIVED NO GROWTH TO DATE CULTURE WILL BE HELD FOR 5 DAYS BEFORE ISSUING A FINAL NEGATIVE REPORT     Performed at [facility]   Report Status PENDING   Incomplete  CULTURE, BLOOD (ROUTINE X 2)     Status: None   Collection Time    10/29/13  8:37 PM      Result Value Ref Range Status   Specimen Description BLOOD ARM LEFT   Final   Special Requests BOTTLES DRAWN AEROBIC AND ANAEROBIC 10CC   Final   Culture  Setup Time     Final   Value: 10/30/2013 05:11     Performed at [facility]   Culture     Final   Value:        BLOOD CULTURE RECEIVED NO GROWTH TO DATE CULTURE WILL BE HELD FOR 5 DAYS BEFORE ISSUING A FINAL NEGATIVE REPORT     Performed at [facility]   Report Status PENDING   Incomplete  URINE CULTURE     Status: None   Collection Time    10/30/13  4:33 PM      Result Value Ref Range Status   Specimen Description URINE, CLEAN CATCH   Final   Special Requests NONE   Final   Culture  Setup Time     Final   Value: 10/30/2013 21:12     Performed at [place]     Final   Value: 3,000 COLONIES/ML     Performed at [facility]   Culture     Final   Value: INSIGNIFICANT  GROWTH     Performed at [facility]   Report Status 10/31/2013 FINAL   Final     Labs: Results for orders placed during  the hospital encounter of 10/29/13 (from the past 48 hour(s))  URINE CULTURE     Status: None   Collection Time    10/30/13  4:33 PM      Result Value Ref Range   Specimen Description URINE, CLEAN CATCH     Special Requests NONE     Culture  Setup Time       Value: 10/30/2013 21:12     Performed at [facility]       Value: 3,000 COLONIES/ML     Performed at [facility]   Culture       Value: INSIGNIFICANT GROWTH     Performed at [facility]   Report Status 10/31/2013 FINAL    COMPREHENSIVE METABOLIC PANEL     Status: Abnormal   Collection Time    10/31/13  4:17 AM      Result Value Ref Range   Sodium 132 (*) 137 - 147 mEq/L   Potassium 4.9  3.7 - 5.3 mEq/L   Comment: DELTA CHECK NOTED   Chloride 98  96 - 112 mEq/L   CO2 25  19 - 32 mEq/L   Glucose, Bld 162 (*) 70 - 99 mg/dL   BUN 19  6 - 23 mg/dL   Creatinine, Ser 0.64  0.50 - 1.35 mg/dL   Calcium 8.3 (*) 8.4 - 10.5 mg/dL   Total Protein 5.6 (*) 6.0 - 8.3 g/dL   Albumin 2.7 (*) 3.5 - 5.2 g/dL   AST 10  0 - 37 U/L   ALT 9  0 - 53 U/L   Alkaline Phosphatase 47  39 - 117 U/L   Total Bilirubin 0.3  0.3 - 1.2 mg/dL   GFR calc non Af Amer >90  >90 mL/min   GFR calc Af Amer >90  >90 mL/min   Comment: (NOTE)     The eGFR has been calculated using the CKD EPI equation.     This calculation has not been validated in all clinical situations.     eGFR's persistently <90 mL/min signify possible Chronic Kidney     Disease.   Anion gap 9  5 - 15  CBC WITH DIFFERENTIAL     Status: Abnormal   Collection Time    10/31/13  4:17 AM      Result Value Ref Range   WBC 7.9  4.0 - 10.5 K/uL   RBC 3.63 (*) 4.22 - 5.81 MIL/uL   Hemoglobin 11.4 (*) 13.0 - 17.0 g/dL   HCT 34.2 (*) 39.0 - 52.0 %   MCV 94.2  78.0 - 100.0 fL   MCH 31.4  26.0 - 34.0 pg   MCHC 33.3  30.0 -  36.0 g/dL   RDW 13.1  11.5 - 15.5 %   Platelets 186  150 - 400 K/uL   Neutrophils Relative % 74  43 - 77 %   Neutro Abs 5.8  1.7 - 7.7 K/uL   Lymphocytes Relative 14  12 - 46 %   Lymphs Abs 1.1  0.7 - 4.0 K/uL   Monocytes Relative 12  3 - 12 %   Monocytes Absolute 1.0  0.1 - 1.0 K/uL   Eosinophils Relative 0  0 - 5 %   Eosinophils Absolute 0.0  0.0 - 0.7 K/uL   Basophils Relative 0  0 - 1 %   Basophils Absolute 0.0  0.0 - 0.1 K/uL  COMPREHENSIVE METABOLIC PANEL     Status: Abnormal  Collection Time    11/01/13  6:05 AM      Result Value Ref Range   Sodium 136 (*) 137 - 147 mEq/L   Potassium 4.4  3.7 - 5.3 mEq/L   Chloride 100  96 - 112 mEq/L   CO2 29  19 - 32 mEq/L   Glucose, Bld 81  70 - 99 mg/dL   BUN 12  6 - 23 mg/dL   Creatinine, Ser 0.64  0.50 - 1.35 mg/dL   Calcium 8.2 (*) 8.4 - 10.5 mg/dL   Total Protein 5.3 (*) 6.0 - 8.3 g/dL   Albumin 2.6 (*) 3.5 - 5.2 g/dL   AST 9  0 - 37 U/L   ALT 10  0 - 53 U/L   Alkaline Phosphatase 44  39 - 117 U/L   Total Bilirubin 0.4  0.3 - 1.2 mg/dL   GFR calc non Af Amer >90  >90 mL/min   GFR calc Af Amer >90  >90 mL/min   Comment: (NOTE)     The eGFR has been calculated using the CKD EPI equation.     This calculation has not been validated in all clinical situations.     eGFR's persistently <90 mL/min signify possible Chronic Kidney     Disease.   Anion gap 7  5 - 15  CBC WITH DIFFERENTIAL     Status: Abnormal   Collection Time    11/01/13  6:05 AM      Result Value Ref Range   WBC 6.5  4.0 - 10.5 K/uL   RBC 3.71 (*) 4.22 - 5.81 MIL/uL   Hemoglobin 11.7 (*) 13.0 - 17.0 g/dL   HCT 35.0 (*) 39.0 - 52.0 %   MCV 94.3  78.0 - 100.0 fL   MCH 31.5  26.0 - 34.0 pg   MCHC 33.4  30.0 - 36.0 g/dL   RDW 13.2  11.5 - 15.5 %   Platelets 193  150 - 400 K/uL   Neutrophils Relative % 43  43 - 77 %   Neutro Abs 2.8  1.7 - 7.7 K/uL   Lymphocytes Relative 47 (*) 12 - 46 %   Lymphs Abs 3.1  0.7 - 4.0 K/uL   Monocytes Relative 9  3 - 12 %    Monocytes Absolute 0.6  0.1 - 1.0 K/uL   Eosinophils Relative 1  0 - 5 %   Eosinophils Absolute 0.1  0.0 - 0.7 K/uL   Basophils Relative 0  0 - 1 %   Basophils Absolute 0.0  0.0 - 0.1 K/uL     [name] is an 68 y.o. male with a history of hypertension, hemochromatosis, COPD, osteoarthritis, rheumatoid arthritis, degenerative disease of the spine and history of lumbar laminectomy in February 2015, as well as daily headaches since surgery, presenting with severe recurrent intractable headache which started 2-3 days ago. He has been evaluated by a headache specialist and is currently being followed by [facility]. He was taking Depakote 250 mg twice a day for headache prevention as well as Imitrex for breakthrough headaches. Depakote has been increased to 500 mg twice a day since admission. CT scan of his head on admission showed no acute intracranial abnormality. He is currently being treated with Dilaudid 0.5 mg every 3 hours when necessary as well as Solu-Medrol which was given once, 125 mg. At the time of this evaluation his headache was moderately severe with a rating of 6/10. He also complained of  tightness in tenderness at the base of his neck and in the frontal and temporal regions.   HOSPITAL COURSE:  Intractable headache  Severe recurrent intractable headache in patient with chronic recurrent headaches. with features of migraine headache as well as muscle contraction (attention) likely contributing to continued head and neck pain. Exam showed no abnormalities and CT scan of his head at the time of admission was unremarkable Patient's symptoms have improved significantly since admission after being initiated on Robaxin. Neurology was consulted. They recommended no lumbar puncture at this time. He was continued on Depakote 500 at bedtime and was treated with one dose of Solu-Medrol 125 mg Patient to follow up with neurology Dr.athar  Leukocytosis  No focal  signs of infection  Patient also received Solu-Medrol yesterday    CAD/HTN: no active CP currently. Hemodynamically stable. Continue home regimen.  RA: continue humira and prednisone. As an outpatient rheumatology appointment today Chronic Pain: low dose morphine overnight. Continue neurontin.         Discharge Exam:   Blood pressure 164/83, pulse 64, temperature 98.3 F (36.8 C), temperature source Oral, resp. rate 20, height 5' 6"  (1.676 m), weight 65.5 kg (144 lb 6.4 oz), SpO2 94.00%.  General: alert and cooperative  HEENT: PERRLA, extra ocular movement intact and + mild photophobia  Heart: S1, S2 normal, no murmur, rub or gallop, regular rate and rhythm  Lungs: clear to auscultation, no wheezes or rales and unlabored breathing  Abdomen: abdomen is soft without significant tenderness, masses, organomegaly or guarding  Extremities: extremities normal, atraumatic, no cyanosis or edema, noted post operative changes on lumbar spine-wound well healed.  Skin:no rashes, no ecchymoses  Neurology: normal without focal findings and kernig and brudzinski negative         Discharge Instructions   Call MD for:  persistant dizziness or light-headedness    Complete by:  As directed      Call MD for:  persistant nausea and vomiting    Complete by:  As directed      Diet - low sodium heart healthy    Complete by:  As directed      Increase activity slowly    Complete by:  As directed            Follow-up Information   Follow up with [facility]. (As needed, If symptoms worsen)    Specialty:  Emergency Medicine   Contact information:   [address] [id number] [place] [address] [phone]      Follow up with [name], MD. Schedule an appointment as soon as possible for a visit in 1 week.   Specialty:  Internal Medicine   Contact information:   [place] [id number] [phone]       Follow up with  Star Age, MD. Schedule an appointment as soon as possible for a visit in 2 weeks. (headache)    Specialties:  Neurology, Radiology   Contact information:   [address] [place] [address] [phone]       Signed: Reyne Dumas 11/01/2013, 2:07 PM

## 2013-11-02 ENCOUNTER — Ambulatory Visit: Payer: Medicare Other | Admitting: Physical Therapy

## 2013-11-02 ENCOUNTER — Telehealth: Payer: Self-pay | Admitting: Neurology

## 2013-11-02 NOTE — Telephone Encounter (Signed)
Patient says that since he was seen in the hospital [facility] will not see him for his weekly visits until Dr. Alain Marion lets them know it is "okay" for him to resume treatment. Advised patient that Dr. Alain Marion is out of the office this week but will answer as soon as possible. Patient is coming in for a hospital follow-up on Monday, 11/15/2013. Can patient resume Neuro Rehab?

## 2013-11-02 NOTE — Telephone Encounter (Signed)
Pt calling for f/u appt.  Seen in the hospital for migraine.  Last seen with Dr. Rexene Alberts 09-20-13, and has appt set 01-28-14.  See sooner?

## 2013-11-03 ENCOUNTER — Ambulatory Visit: Payer: Medicare Other | Admitting: Physical Therapy

## 2013-11-03 DIAGNOSIS — IMO0001 Reserved for inherently not codable concepts without codable children: Secondary | ICD-10-CM | POA: Diagnosis not present

## 2013-11-03 NOTE — Telephone Encounter (Signed)
Okay to see either [facility] or [employer]

## 2013-11-03 NOTE — Telephone Encounter (Signed)
Called patient to sched 2 wk f/u appt. With the NP(lam, or Hassell Done) per Dr Guadelupe Sabin note, lt VM message to call back

## 2013-11-03 NOTE — Telephone Encounter (Signed)
Called patient to schedule an appointment with NP LL, no answer left message to call back and r/s appointment

## 2013-11-05 LAB — CULTURE, BLOOD (ROUTINE X 2)
Culture: NO GROWTH
Culture: NO GROWTH

## 2013-11-08 NOTE — Telephone Encounter (Signed)
Called and left VM message,to call back for scheduling sooner appt with NP

## 2013-11-08 NOTE — Telephone Encounter (Signed)
Ok to resume, if he is not having bad HA and excessive somnolence  Thx

## 2013-11-09 ENCOUNTER — Encounter: Payer: Self-pay | Admitting: Nurse Practitioner

## 2013-11-09 ENCOUNTER — Ambulatory Visit (INDEPENDENT_AMBULATORY_CARE_PROVIDER_SITE_OTHER): Payer: Medicare Other | Admitting: Nurse Practitioner

## 2013-11-09 ENCOUNTER — Ambulatory Visit: Payer: Medicare Other | Attending: Internal Medicine | Admitting: Physical Therapy

## 2013-11-09 VITALS — BP 100/62 | HR 76 | Ht 66.0 in | Wt 154.0 lb

## 2013-11-09 DIAGNOSIS — R490 Dysphonia: Secondary | ICD-10-CM | POA: Diagnosis not present

## 2013-11-09 DIAGNOSIS — R1313 Dysphagia, pharyngeal phase: Secondary | ICD-10-CM | POA: Diagnosis not present

## 2013-11-09 DIAGNOSIS — IMO0001 Reserved for inherently not codable concepts without codable children: Secondary | ICD-10-CM | POA: Insufficient documentation

## 2013-11-09 DIAGNOSIS — G43719 Chronic migraine without aura, intractable, without status migrainosus: Secondary | ICD-10-CM

## 2013-11-09 DIAGNOSIS — G44209 Tension-type headache, unspecified, not intractable: Secondary | ICD-10-CM

## 2013-11-09 MED ORDER — METHOCARBAMOL 500 MG PO TABS: 500.0000 mg | ORAL_TABLET | Freq: Three times a day (TID) | ORAL | Status: DC | PRN

## 2013-11-09 MED ORDER — RIZATRIPTAN BENZOATE 10 MG PO TBDP: 10.0000 mg | ORAL_TABLET | ORAL | Status: DC | PRN

## 2013-11-09 MED ORDER — ONDANSETRON HCL 4 MG PO TABS: 4.0000 mg | ORAL_TABLET | Freq: Three times a day (TID) | ORAL | Status: DC | PRN

## 2013-11-09 MED ORDER — DIVALPROEX SODIUM ER 500 MG PO TB24: 500.0000 mg | ORAL_TABLET | Freq: Every day | ORAL | Status: DC

## 2013-11-09 NOTE — Progress Notes (Addendum)
PATIENT: [name] DOB: 06-Sep-1945  REASON FOR VISIT: routine follow up for Mixed Migraine and tension HA HISTORY FROM: patient  HISTORY OF PRESENT ILLNESS: [name] is a 68 year old right-handed gentleman with an underlying complex medical history of hypertension, osteoarthritis, diverticulosis, hemachromatosis, coronary artery disease, allergic rhinitis, rheumatoid arthritis, and COPD.  Update 11/09/13 (LL): Patient who presents for a sooner than scheduled appointment for headache. [name] was admitted to the hospital on 10/29/13 for severe headache.  CT scan of his head on admission showed no acute intracranial abnormality. [name] was taking Depakote 250 mg twice a day for headache prevention as well as Imitrex for breakthrough headaches.  [name] also complained of tightness in tenderness at the base of his neck and in the frontal and temporal regions. Depakote was been increased to 500 mg each night, and was treated with one dose of Solu-Medrol 125 mg.  [name] was discharged on 11/01/13. Since that time, [name] has been virtually headache-free. [name] states his headaches are occurring once about every 1-2 weeks.  They ususally last for 24 hours, but have lasted for 10 days straight. [name] feels like alternating Roabxin and Imitrex usually bring him relief, but one dose of Imitrex only dulls the headache, does not relieve it.  09/20/13 (SA): I have seen him before for altered mental status and balance problems including evidence of ataxia. [name] is accompanied by his friend today. I last saw him on 07/26/2013, at which time we discussed his multiple recent hospitalizations. [name] reported problems with hoarseness and softer speech and I referred him to ENT. I also ordered a swallow study. This was done on 08/11/2013 and showed mild sensory base pharyngeal dysphagia, delayed swallow reflex, penetration and aspiration of thin and nectar thick liquids with no reflexive cough, compensatory positioning with chin tuck was  ineffective, however individual small boluses decreased penetration and aspiration. [name] was advised to followup with speech therapy. I made a referral in that regard. The patient called on 09/06/2013 reporting a two-week headache. Dr. Vickey Huger saw him on my behalf as I was not in the office that week, and [name] was treated in our office with Depacon infusion as well as IV Solu-Medrol, 500 mg each. [name] improved. Dr. Vickey Huger ordered a repeat brain MRI and [name] had a brain MRI with and without contrast on 09/18/2013: Abnormal MRI scan of the brain showing mild changes of chronic microvascular ischemia and generalized cerebral atrophy. Overall no significant change compared with previous MRI scan dated 06/30/2012. In addition, I personally reviewed the images through the PACS system.  Today, [name] reports, that his headaches started in March 2015 and [name] reports a typical right sided. [name] tried Imitrex per PCP about a month ago, and it did not help at 100 mg strength. [name] recently had a cough and other family members had a cough. [name] felt, that the IV Depacon helped a lot. [name] saw Dr. Delford Field in ENT and had a scope. [name] was told [name] had no vocal cord paralysis. [name] had ST, which helped, with Verdie Mosher. [name] has noted, that ASA has helped for HAs some. [name] is on Prednisone 10 mg daily. [name] has had no recent fevers. Mucinex has helped. His HA currently is a 3/10. [name] has had some nausea, rare vomiting. [name] has some photophobia. It helps to sleep. [name] has not had any one-sided weakness or numbness or tingling, no slurring of speech or droopy face. [name] does not have a personal or family history of migraines.  I saw him on 12/15/2012, at which time we talked about previous test results again. We talked about his hospitalization from April 2014. Overall [name] had improved at the time. Workup had shown increased CSF protein, increased IgG index, positive oligoclonal bands. This of course in the context of autoimmune disease, namely rheumatoid  arthritis, on Humira. EEG was negative. We looked for cancer with screening CT abdomen pelvis and chest x-ray. [name] has been on immune suppressant, Humira. Brain MRI did not show any demyelinating process or mass or enhancing lesions. I considered a second opinion at a larger [facility] and a repeat brain MRI. His last CTH was on 06/30/13: Involutional and chronic changes without evidence of focal or acute abnormalities.  On 03/02/2013 [name] had lower spine surgery with decompression of the thecal sac and facetectomy, L2-3 discectomy, removal of herniated disc, placement of pedicle screws L1-3 and S1, fusion from L1-S1 involving the L4-5 fusion; this was under Dr. Jeral Fruit. Postoperatively [name] developed hypotension. [name] was discharged on 03/09/2013 to a skilled nursing facility. On 03/16/2013 [name] developed fever and vomiting and was readmitted under neurosurgery. [name] was found to have hypovolemia, hyponatremia and elevation in BUN and creatinine. On 03/17/2013 [name] had revision of a lumbar wound.  On 03/19/2013 [name] was seen by infectious disease for a lumbar wound infection and treatment thereof. [name] was discharged to home on 04/06/2013 after being treated for a wound infection with IV antibiotics. [name] presented on 04/13/2013 with fever and hypotension and was sent to the emergency room for further workup and treatment. ENT was consulted for hoarseness and GI was consulted for concern for small bowel obstruction which was treated conservatively. [name] was discharged on 04/24/2013. [name] has had multiple falls with and without injuries, most recently a laceration to his face on 07/01/2013. [name] has had HH physical therapy and speech therapy. [name] lives with his son currently. [name] is supposed to have 24 hour supervision and has not been fully compliant with the use of assistive devices as recommended by home health PT.  [name] was admitted on 05/06/2013 for AMS, deemed secondary to polypharmacy including opioid overdose, presenting with  mental status changes, acute renal failure, acute respiratory failure with hypoxia. [name] was discharged on 05/09/2013.  [name] was admitted on 05/23/2013 with generalized weakness, fever, nausea and vomiting. [name] was transferred to inpatient rehabilitation on 05/27/2013. [name] was discharged on 06/10/2013.  [name] was admitted on 06/16/2013 for altered mental status. [name] was discharged on 06/22/2013. [name] presented to the emergency room on 06/30/2013 for fever. [name] presented to the emergency room on 07/01/2013 for a fall.  I saw him on 10/02/2012 after a recent hospitalization.  I first met him on 07/14/2012 at the request of Dr. Yetta Barre. [name] has an underlying complex medical history including rheumatoid arthritis, osteoarthritis, COPD, atrial fibrillation, anemia, hypertension, heart disease, reported fairly sudden onset unsteadiness of his gait, vertigo, dizziness, blurry vision since early March 2014 there was no significant prodromal illness. His wife had noted some slurring of speech. Head CT on 06/22/2012 showed no acute abnormality. 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 [name] also reported some nausea for which meclizine and scopolamine patch helped. I arranged for a hospital admission from clinic because of concerns with cerebellar dysfunction including dysarthria, nystagmus, but also some alteration in his mental status. The patient was not fully oriented and was sluggish in his responses and seemed confused, talking out of context at times. [name] had  mild dysmetria bilaterally.  CSF showed: increased protein, increased IgG index and positive oligoclonal bands in his CSF. His altered mental status improved and his exam was better and [name] benefited from physical therapy which [name] completed. I felt that [name] had a variety of differential diagnoses possible including cerebellitis, and cephalitis, metabolic-toxic or other immune-paraneoplastic causes of his presentation.  I recommended paraneoplastic testing. EEG was normal, chest x-ray was negative, CT abdomen and pelvis were negative. Of note, [name] has been on immune suppression with Humira. MRI did not show any demyelinating lesions.  His paraneoplastic testing was negative in July 2014. [name] had foot surgery on the right. [name] has been on Humira for the past 2 years, Howard injections every other week.   REVIEW OF SYSTEMS: Full 14 system review of systems performed and notable only for: Fatigue, daytime sleepiness, joint pain, joint swelling, back pain, walking difficulty, neck pain, neck stiffness.   ALLERGIES: Allergies  Allergen Reactions  . Morphine And Related Shortness Of Breath, Nausea And Vomiting, Swelling and Other (See Comments)    Agitation, tolerates dilaudid  . Penicillins Hives, Shortness Of Breath and Rash    REACTION: hives, breathing problems  . Cefixime     rash  . Percocet [Oxycodone-Acetaminophen] Anxiety    Takes OxyContin at home.    HOME MEDICATIONS: Outpatient Prescriptions Prior to Visit  Medication Sig Dispense Refill  . Adalimumab (HUMIRA PEN [place]) Inject 40 mg into the skin every 14 (fourteen) days. Every other saturday      . aspirin (ASPIRIN EC) 81 MG EC tablet Take 1 tablet (81 mg total) by mouth every morning.  30 tablet  12  . cholecalciferol (VITAMIN D) 1000 UNITS tablet Take 1 tablet (1,000 Units total) by mouth daily.  100 tablet  3  . FLUoxetine (PROZAC) 20 MG capsule Take 40 mg by mouth daily.      [name] gabapentin (NEURONTIN) 300 MG capsule Take 1 capsule (300 mg total) by mouth 3 (three) times daily.  60 capsule  1  . losartan-hydrochlorothiazide (HYZAAR) 50-12.5 MG per tablet Take 1 tablet by mouth daily.  90 tablet  3  . metoprolol tartrate (LOPRESSOR) 25 MG tablet Take 1 tablet (25 mg total) by mouth 2 (two) times daily.  60 tablet  1  . pantoprazole (PROTONIX) 20 MG tablet Take 2 tablets (40 mg total) by mouth 2 (two) times daily.  60 tablet  11  . potassium chloride SA  (KLOR-CON M20) 20 MEQ tablet Take 1 tablet (20 mEq total) by mouth 2 (two) times daily.  60 tablet  5  . predniSONE (DELTASONE) 5 MG tablet Take 1 tablet (5 mg total) by mouth every evening.  30 tablet  1  . simvastatin (ZOCOR) 20 MG tablet Take 1 tablet (20 mg total) by mouth at bedtime.  90 tablet  3  . sodium chloride 0.9 % SOLN 100 mL with valproate 500 MG/5ML SOLN 500 mg Inject 500 mg into the vein once.  500 mg  1  . SUMAtriptan (IMITREX) 100 MG tablet Take 100 mg by mouth See admin instructions. TAKE 1 TABLET BY MOUTH EVERY 2 HOURS AS NEEDED FOR MIGRAINE/HEADACHE REPEAT IN 2 HOURS IF PRESIST      . divalproex (DEPAKOTE ER) 500 MG 24 hr tablet Take 1 tablet (500 mg total) by mouth at bedtime.  60 tablet  2  . methocarbamol (ROBAXIN) 500 MG tablet Take 1 tablet (500 mg total) by mouth every 8 (eight) hours as needed for muscle  spasms.  60 tablet  0   No facility-administered medications prior to visit.   PHYSICAL EXAM Filed Vitals:   11/09/13 1353 11/09/13 1400  BP: 95/55 100/62  Pulse: 73 76  Height: 5\' 6"  (1.676 m)   Weight: 154 lb (69.854 kg)    Body mass index is 24.87 kg/(m^2).  Generalized: Well developed, in no acute distress  Head: normocephalic and atraumatic. Oropharynx benign  Neck: Supple, no carotid bruits  Cardiac: Regular rate rhythm, no murmur  Musculoskeletal: No deformity   Neurological examination  Mentation: Alert oriented to time, place, history taking. Follows all commands speech and language fluent Cranial nerve II-XII: Fundoscopic exam not done. Pupils were equal round reactive to light extraocular movements were full, visual field were full on confrontational test. Facial sensation and strength were normal. hearing was intact to finger rubbing bilaterally. Uvula tongue midline. head turning and shoulder shrug and were normal and symmetric.Tongue protrusion into cheek strength was normal. Motor: The motor testing reveals 5 over 5 strength of all 4  extremities. Good symmetric motor tone is noted throughout.  Sensory: Sensory testing is intact to soft touch on all 4 extremities. No evidence of extinction is noted.  Coordination: Cerebellar testing reveals good finger-nose-finger and heel-to-shin bilaterally.  Gait and station: Gait is normal. Tandem gait is normal. Romberg is negative. Reflexes: Deep tendon reflexes are symmetric and normal bilaterally.   ASSESSMENT: In summary, [name] is a very pleasant 68 year old male with a complex medical history of hypertension, osteoarthritis, diverticulosis, hemachromatosis, coronary artery disease, allergic rhinitis, rheumatoid arthritis, and COPD, who presents for a sooner than scheduled appointment for new onset headaches, which started in March. We repeated his brain MRI which showed no significant changes from March 2014 and I explained the findings of white matter changes and atrophy to him as well as his girlfriend. His exam appears to be stable. [name] does have evidence of mild ataxia but overall has come a long way. [name] has had multiple admissions and ER visits until March of this year. [name] has done well with speech therapy and recently also saw ENT and does not need to go back. Workup in the past has included spinal fluid testing which showed increased protein, increased IgG index and positive OCB. [name] had altered mental status which overall improved. Unfortunately [name] had significant issues in the interim with complications from lower back surgery including wound infection and a protracted course clinically. Paraneoplastic testing and screening for cancer in the past was negative, EEG normal, negative chest x-ray and negative CT abdomen and pelvis for any mass. [name] has been on Humira, which was restarted and [name] is on prednisone 10 mg daily. MRI from March 2014 and repeat brain MRI with and without contrast from 2 days ago did not show any demyelinating process or mass or enhancing lesion. His  headache presentation and history is mostly in keeping with migrainous headaches. While it is unusual to present with migraines at this stage in life it is not unheard of. It may be in conjunction with tension-type headaches. This may be a mixed headache. Nevertheless, [name] has had significant improvement with IV Depacon recently. To that end, I have asked him to start Depakote ER once daily 250 mg strength and after 1 week [name] can increase it to 500 mg daily at night. We talked about potential side effects including sedation, confusion, balance issues. His liver function was fine last month.   PLAN: [name] is to continue Depakote ER  500 mg each night. [name] was given a prescription for Zofran 4 mg when necessary for nausea associated with headache. [name] was given a prescription for Maxalt 10 mg when necessary for acute migraine, since [name] had only limited relief with Imitrex. [name] may also use Robaxin when necessary for headache and muscle tightness.  Dr. Frances Furbish will see him back in October. [name] is encouraged to call with any interim questions, concerns or problems. [name] was in agreement.  [name] was given headache diary to track headaches and bring to next visit.  Meds ordered this encounter  Medications  . ondansetron (ZOFRAN) 4 MG tablet    Sig: Take 1 tablet (4 mg total) by mouth every 8 (eight) hours as needed for nausea or vomiting.    Dispense:  30 tablet    Refill:  2    Order Specific Question:  Supervising Provider    Answer:  Huston Foley [[id number]]  . methocarbamol (ROBAXIN) 500 MG tablet    Sig: Take 1 tablet (500 mg total) by mouth every 8 (eight) hours as needed for muscle spasms.    Dispense:  60 tablet    Refill:  5    Order Specific Question:  Supervising Provider    Answer:  Huston Foley [[id number]]  . divalproex (DEPAKOTE ER) 500 MG 24 hr tablet    Sig: Take 1 tablet (500 mg total) by mouth at bedtime.    Dispense:  90 tablet    Refill:  2    Order Specific Question:  Supervising Provider    Answer:   Huston Foley [[id number]]  . rizatriptan (MAXALT-MLT) 10 MG disintegrating tablet    Sig: Take 1 tablet (10 mg total) by mouth as needed for migraine. May repeat in 2 hours if needed    Dispense:  8 tablet    Refill:  5    Order Specific Question:  Supervising Provider    Answer:  Huston Foley [[id number]]   Tawny Asal Nainika Newlun, MSN, FNP-BC, A/GNP-C 11/09/2013, 2:48 PM [facility] [address] [place], [place] [address] [phone]  Note: This document was prepared with digital dictation and possible smart phrase technology. Any transcriptional errors that result from this process are unintentional.  I reviewed the above note and documentation by the Nurse Practitioner and agree with the history, physical exam, assessment and plan as outlined above. Huston Foley, MD, PhD [facility])

## 2013-11-09 NOTE — Telephone Encounter (Signed)
Patient was seen 11/09/2013

## 2013-11-09 NOTE — Patient Instructions (Signed)
Refills Depakote, and Robaxin.  Maxalt disintegrating tablets, take 1 at first signs of severe headache.  May repeat x 1 in 2 hours if needed.  Zofran 4 mg tablets prn for nausea.  Please keep headache diary and bring to next visit.  Keep follow up appt in October.

## 2013-11-11 ENCOUNTER — Telehealth: Payer: Self-pay | Admitting: Nurse Practitioner

## 2013-11-11 ENCOUNTER — Ambulatory Visit: Payer: Medicare Other | Admitting: Physical Therapy

## 2013-11-11 DIAGNOSIS — IMO0001 Reserved for inherently not codable concepts without codable children: Secondary | ICD-10-CM | POA: Diagnosis not present

## 2013-11-11 NOTE — Telephone Encounter (Signed)
Nakia from [employer] calling to state that patient's Odansetron medication has been approved for one year starting 11/11/13 and an approval letter will be mailed out, if questions, please call.

## 2013-11-15 ENCOUNTER — Encounter: Payer: Self-pay | Admitting: Internal Medicine

## 2013-11-15 ENCOUNTER — Ambulatory Visit (INDEPENDENT_AMBULATORY_CARE_PROVIDER_SITE_OTHER): Payer: Medicare Other | Admitting: Internal Medicine

## 2013-11-15 VITALS — BP 125/79 | HR 63 | Temp 97.2°F | Wt 154.0 lb

## 2013-11-15 DIAGNOSIS — I2510 Atherosclerotic heart disease of native coronary artery without angina pectoris: Secondary | ICD-10-CM

## 2013-11-15 DIAGNOSIS — G44209 Tension-type headache, unspecified, not intractable: Secondary | ICD-10-CM

## 2013-11-15 DIAGNOSIS — Z9181 History of falling: Secondary | ICD-10-CM

## 2013-11-15 DIAGNOSIS — R296 Repeated falls: Secondary | ICD-10-CM

## 2013-11-15 DIAGNOSIS — I10 Essential (primary) hypertension: Secondary | ICD-10-CM

## 2013-11-15 MED ORDER — FLUTICASONE PROPIONATE 50 MCG/ACT NA SUSP: 2.0000 | Freq: Every day | NASAL | Status: DC

## 2013-11-15 MED ORDER — ONDANSETRON HCL 4 MG PO TABS: 4.0000 mg | ORAL_TABLET | Freq: Three times a day (TID) | ORAL | Status: DC | PRN

## 2013-11-15 NOTE — Progress Notes (Signed)
Subjective:    HPI  Post-hosp f/up  [name] was admitted to the hospital on 10/29/13 for severe headache. CT scan of his head on admission showed no acute intracranial abnormality. He was taking Depakote 250 mg twice a day for headache prevention as well as Imitrex for breakthrough headaches. He also complained of tightness in tenderness at the base of his neck and in the frontal and temporal regions. Depakote was been increased to 500 mg each night, and was treated with one dose of Solu-Medrol 125 mg. He was discharged on 11/01/13. Since that time, he has been virtually headache-free. He states his headaches are occurring once about every 1-2 weeks. They ususally last for 24 hours, but have lasted for 10 days straight. He feels like alternating Roabxin and Imitrex usually bring him relief, but one dose of Imitrex only dulls the headache, does not relieve it.    F/u fatigue, sleeping a lot - better since he stopped am Gabapentin and Robaxin. In PT for LBP now  He saw Dr Melton Alar, Dr Verdie Mosher Readings from Last 3 Encounters:  11/15/13 154 lb (69.854 kg)  11/09/13 154 lb (69.854 kg)  10/30/13 144 lb 6.4 oz (65.5 kg)     BP Readings from Last 3 Encounters:  11/15/13 125/79  11/09/13 100/62  11/01/13 164/83   (Pt's son left vm 7/3  stating pt is dizzy, can't walk or stand with unsteady gait. He states he sleeps all the time. [name] thinks some of his meds are affecting him and he doesn't need to be on all of them)  S/p recent LS surgery, sepsis, rehab - weak muscles....  [name] has had a very difficult time with multiple medical problems following back surgery - all well documented in the hospital record and previous notes. Since his last hospital d/c he has been living with his son. By their report he is off narcotics!! He is still having quite a bit of pain but is managing. He has lost a lot of weight but is currently on a good diet plus calorie supplement. He does report  persistent Headache in a frontal-vertex/occipital distribution x 1 year.   Past Medical History   Diagnosis  Date   .  Hx of colonic polyps    .  Diverticulosis    .  Hypertension    .  Hemochromatosis      dx'd 14 yrs ago last ferritin Aug 11, 08 52 (22-322), Fe 136   .  CAD (coronary artery disease)      minimal coronary plaque in the LAD and right coronary system. 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 7-06. Last nuclear stress 11-17-06 fixed anterior/ inferior defect, no inducible ischemia, EF 81%   .  Allergic rhinitis    .  Hx of colonoscopy    .  Dysrhythmia  01-24-12     past hx. A.Fib x1 episode-responded to med.   .  Depression    .  GERD (gastroesophageal reflux disease)    .  COPD (chronic obstructive pulmonary disease)      pt denies this hx on 05/24/2013   .  Osteoarthritis    .  RA (rheumatoid arthritis)    .  Chronic back pain      "all over back" (06/16/2013)   .  PONV (postoperative nausea and vomiting)    .  High cholesterol    .  Sleep apnea      "  never dx'd; snores bad" (06/16/2013)   .  Daily headache    .  Falls frequently      "since 02/2013" (06/16/2013)   .  Anxiety    .  Narcotic abuse      per family   .  Alcoholism /alcohol abuse      per family   .  Dementia     Past Surgical History   Procedure  Laterality  Date   .  Appendectomy     .  Total knee arthroplasty  Right  2002     partial   .  Shoulder hemi-arthroplasty  Left  2008     partial   .  Foot surgery  Right  11-08     for removal of bone spurs-   .  Ankle reconstruction  Right  6-09     [facility]   .  Posterior lumbar fusion   12-10     L4-5 diskectomy w/ fusion, cage placement and rods; [facility]   .  Partial knee arthroplasty  Left    .  Total shoulder replacement  Right    .  Joint replacement     .  Cataract extraction, bilateral  Bilateral  01-24-12   .  Orif shoulder fracture   02/06/2012     Procedure: OPEN  REDUCTION INTERNAL FIXATION (ORIF) SHOULDER FRACTURE; Surgeon: Nita Sells, MD; Location: [facility]; Service: Orthopedics; Laterality: Left; ORIF of a Left Shoulder Fracture with Iliac Crest Bone Graft aspiration   .  Harvest bone graft   02/06/2012     Procedure: HARVEST ILIAC BONE GRAFT; Surgeon: Nita Sells, MD; Location: [facility]; Service: Orthopedics;; bone marrow aspirqation   .  Hardware removal   03/09/2012     Procedure: HARDWARE REMOVAL; Surgeon: Nita Sells, MD; Location: [place]; Service: Orthopedics; Laterality: Left; Hardware Removal from Left Shoulder   .  Shoulder arthroscopy  Right  2013   .  Carpal tunnel release  Right  1990's   .  Total ankle replacement  Right  2008     at [facility]   .  Hammer toe surgery  Right  07/2012     "broke 4 hammertoes"   .  Posterior lumbar fusion 4 level  N/A  03/02/2013     Procedure: Lumbar One to Sacral One Posterior lumbar interbody fusion; Surgeon: Floyce Stakes, MD; Location: [place] [facility]; Service: Neurosurgery; Laterality: N/A; L1 to S1 Posterior lumbar interbody fusion   .  Lumbar wound debridement  N/A  03/17/2013     Procedure: Incision and drainage of superficial lumbar wound; Surgeon: Floyce Stakes, MD; Location: [place] [facility]; Service: Neurosurgery; Laterality: N/A; Incision and drainage of superficial lumbar wound   .  Tonsillectomy     .  Coronary angioplasty with stent placement   2006     "while repairing 1st stent, a second area tore and they had to place 2nd stent " ?LAD & CX   .  Inguinal hernia repair  Bilateral     Family History   Problem  Relation  Age of Onset   .  Uterine cancer  Mother      survivor   .  Macular degeneration  Mother    .  Other  Mother      ankle edema   .  Lung cancer  Mother    .  Cancer  Mother      ovarian, lung   .  Coronary artery disease  Father    .  Hypertension  Father    .  Prostate cancer  Father    .  Colon polyps  Father    .   Cancer  Father      prostate   .  Thyroid disease  Sister    .  Heart attack  Brother    .  Hyperlipidemia  Brother    .  Other  Brother      Schizophrenic   .  Diabetes  Neg Hx    .  Colon cancer  Neg Hx    .  Esophageal cancer  Neg Hx    .  Rectal cancer  Neg Hx    .  Stomach cancer  Neg Hx    .  Coronary artery disease  Maternal Aunt    .  Heart attack  Maternal Aunt    .  Rheum arthritis  Sister    .  Hemochromatosis  Sister     History    Social History   .  Marital Status:  Divorced     Spouse Name:  N/A     Number of Children:  2   .  Years of Education:  16    Occupational History   .  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  Smoking status:  Never Smoker   .  Smokeless tobacco:  Never Used   .  Alcohol Use:  0.0 oz/week      Comment: 06/16/2013 "used to drink alot; was pretty bad about it; don't think he's had any alcohol in the past year"   .  Drug Use:  No   .  Sexual Activity:  Yes     Partners:  Female    Other Topics  Concern   .  Not on file    Social History Narrative    [employer] [place]. Married 1972-58yr seperated/divorced. Engaged April '11. 1 son '74; step-daughter '70. 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 During '11 discovered an [employer]- $500,000+ loss. Is handling this OK - will keep the business running.    Current Outpatient Prescriptions on File Prior to Visit   Medication  Sig  Dispense  Refill   .  Aclidinium Bromide (TUDORZA PRESSAIR) 400 MCG/ACT AEPB  Inhale 1 puff into the lungs 2 (two) times daily.  1 each  11   .  aspirin (ASPIRIN EC) 81 MG EC tablet  Take 1 tablet (81 mg total) by mouth every morning.  30 tablet  12   .  cholecalciferol (VITAMIN D) 1000 UNITS tablet  Take 1 tablet (1,000 Units total) by mouth daily.  100 tablet  3   .  FLUoxetine (PROZAC) 20 MG capsule  Take 40 mg  by mouth daily.     .  fluticasone (FLONASE) 50 MCG/ACT nasal spray  Place 2 sprays into both nostrils at bedtime.     .  gabapentin (NEURONTIN) 300 MG capsule  Take 2 capsules (600 mg total) by mouth 2 (two) times daily.  60 capsule  1   .  losartan (COZAAR) 50 MG tablet  Take 1 tablet (50 mg total) by mouth daily.  30 tablet  1   .  metoprolol tartrate (LOPRESSOR) 25 MG tablet  Take 1 tablet (25  mg total) by mouth 2 (two) times daily.  60 tablet  1   .  Polyethyl Glycol-Propyl Glycol (SYSTANE ULTRA) 0.4-0.3 % SOLN  Place 2 drops into both eyes 2 (two) times daily as needed (dry eyes).     .  predniSONE (DELTASONE) 5 MG tablet  Take 1 tablet (5 mg total) by mouth every evening.  30 tablet  1   .  promethazine (PHENERGAN) 25 MG tablet  Take 1 tablet (25 mg total) by mouth every 6 (six) hours as needed for nausea or vomiting.  20 tablet  0   .  simvastatin (ZOCOR) 40 MG tablet  Take 1 tablet (40 mg total) by mouth daily with lunch.  30 tablet  1   .  docusate sodium (COLACE) 100 MG capsule  Take 1 capsule (100 mg total) by mouth 2 (two) times daily.  60 capsule  0    No current facility-administered medications on file prior to visit.       Review of Systems  Constitutional: Negative for appetite change, fatigue and unexpected weight change.  HENT: Positive for voice change. Negative for congestion, nosebleeds, sneezing, sore throat and trouble swallowing.   Eyes: Negative for itching and visual disturbance.  Respiratory: Negative for cough.   Cardiovascular: Negative for chest pain, palpitations and leg swelling.  Gastrointestinal: Negative for nausea, vomiting, diarrhea, blood in stool and abdominal distention.  Genitourinary: Negative for frequency and hematuria.  Musculoskeletal: Positive for arthralgias, back pain, gait problem, neck pain and neck stiffness. Negative for joint swelling.  Skin: Negative for rash and wound.  Neurological: Negative for dizziness, tremors, speech  difficulty, weakness, light-headedness and headaches.  Psychiatric/Behavioral: Positive for sleep disturbance. Negative for suicidal ideas, dysphoric mood and agitation. The patient is not nervous/anxious.        Objective:   Physical Exam  Constitutional: He is oriented to person, place, and time. He appears well-developed. No distress.  NAD  HENT:  Mouth/Throat: Oropharynx is clear and moist.  Not hoarse  Eyes: Conjunctivae are normal. Pupils are equal, round, and reactive to light.  Neck: Normal range of motion. No JVD present. No thyromegaly present.  Cardiovascular: Normal rate, regular rhythm, normal heart sounds and intact distal pulses.  Exam reveals no gallop and no friction rub.   No murmur heard. Pulmonary/Chest: Effort normal and breath sounds normal. No respiratory distress. He has no wheezes. He has no rales. He exhibits no tenderness.  Abdominal: Soft. Bowel sounds are normal. He exhibits no distension and no mass. There is no tenderness. There is no rebound and no guarding.  Musculoskeletal: Normal range of motion. He exhibits no edema and no tenderness.  Lymphadenopathy:    He has no cervical adenopathy.  Neurological: He is alert and oriented to person, place, and time. He has normal reflexes. No cranial nerve deficit. He exhibits normal muscle tone. He displays a negative Romberg sign. Coordination and gait normal.   No cane LS is tender  Skin: Skin is warm and dry. No rash noted.  Psychiatric: He has a normal mood and affect. His behavior is normal. Judgment and thought content normal.  Looks well   Lab Results  Component Value Date   WBC 6.5 11/01/2013   HGB 11.7* 11/01/2013   HCT 35.0* 11/01/2013   PLT 193 11/01/2013   GLUCOSE 81 11/01/2013   CHOL 100 10/15/2010   TRIG 75.0 10/15/2010   HDL 36.70* 10/15/2010   LDLCALC 48 10/15/2010   ALT 10 11/01/2013  AST 9 11/01/2013   NA 136* 11/01/2013   K 4.4 11/01/2013   CL 100 11/01/2013   CREATININE 0.64 11/01/2013   BUN 12  11/01/2013   CO2 29 11/01/2013   TSH 0.220* 04/13/2013   PSA 1.95 10/15/2010   INR 1.26 06/08/2013   HGBA1C 5.1 03/21/2013        Assessment & Plan:

## 2013-11-15 NOTE — Progress Notes (Signed)
Pre visit review using our clinic review tool, if applicable. No additional management support is needed unless otherwise documented below in the visit note. 

## 2013-11-15 NOTE — Assessment & Plan Note (Signed)
Continue with current prescription therapy as reflected on the Med list.  

## 2013-11-15 NOTE — Assessment & Plan Note (Addendum)
Continue with current prescription therapy as reflected on the Med list. Hosp notes, Neurol notes were reviewed

## 2013-11-15 NOTE — Assessment & Plan Note (Signed)
Better  

## 2013-11-16 ENCOUNTER — Ambulatory Visit: Payer: Medicare Other | Admitting: Physical Therapy

## 2013-11-16 DIAGNOSIS — IMO0001 Reserved for inherently not codable concepts without codable children: Secondary | ICD-10-CM | POA: Diagnosis not present

## 2013-11-17 ENCOUNTER — Other Ambulatory Visit: Payer: Self-pay | Admitting: *Deleted

## 2013-11-17 MED ORDER — POTASSIUM CHLORIDE CRYS ER 20 MEQ PO TBCR: 20.0000 meq | EXTENDED_RELEASE_TABLET | Freq: Two times a day (BID) | ORAL | Status: DC

## 2013-11-18 ENCOUNTER — Emergency Department (HOSPITAL_COMMUNITY): Payer: Medicare Other

## 2013-11-18 ENCOUNTER — Encounter (HOSPITAL_COMMUNITY): Payer: Self-pay | Admitting: Emergency Medicine

## 2013-11-18 ENCOUNTER — Encounter: Payer: Self-pay | Admitting: Nurse Practitioner

## 2013-11-18 ENCOUNTER — Telehealth: Payer: Self-pay | Admitting: Nurse Practitioner

## 2013-11-18 ENCOUNTER — Ambulatory Visit (INDEPENDENT_AMBULATORY_CARE_PROVIDER_SITE_OTHER): Payer: Medicare Other | Admitting: Nurse Practitioner

## 2013-11-18 ENCOUNTER — Ambulatory Visit: Payer: Medicare Other

## 2013-11-18 ENCOUNTER — Inpatient Hospital Stay (HOSPITAL_COMMUNITY)
Admission: EM | Admit: 2013-11-18 | Discharge: 2013-11-25 | DRG: 559 | Disposition: A | Discharge status: Home / Self Care | Payer: Medicare Other | Attending: Neurosurgery | Admitting: Neurosurgery

## 2013-11-18 ENCOUNTER — Ambulatory Visit (INDEPENDENT_AMBULATORY_CARE_PROVIDER_SITE_OTHER): Payer: Medicare Other | Admitting: Neurology

## 2013-11-18 ENCOUNTER — Telehealth: Payer: Self-pay | Admitting: *Deleted

## 2013-11-18 VITALS — BP 160/98 | HR 100 | Temp 101.7°F | Ht 66.5 in | Wt 153.6 lb

## 2013-11-18 DIAGNOSIS — I10 Essential (primary) hypertension: Secondary | ICD-10-CM | POA: Diagnosis present

## 2013-11-18 DIAGNOSIS — Y849 Medical procedure, unspecified as the cause of abnormal reaction of the patient, or of later complication, without mention of misadventure at the time of the procedure: Secondary | ICD-10-CM | POA: Diagnosis present

## 2013-11-18 DIAGNOSIS — G43009 Migraine without aura, not intractable, without status migrainosus: Secondary | ICD-10-CM | POA: Diagnosis present

## 2013-11-18 DIAGNOSIS — K219 Gastro-esophageal reflux disease without esophagitis: Secondary | ICD-10-CM | POA: Diagnosis present

## 2013-11-18 DIAGNOSIS — M479 Spondylosis, unspecified: Secondary | ICD-10-CM | POA: Diagnosis present

## 2013-11-18 DIAGNOSIS — T847XXA Infection and inflammatory reaction due to other internal orthopedic prosthetic devices, implants and grafts, initial encounter: Secondary | ICD-10-CM | POA: Diagnosis present

## 2013-11-18 DIAGNOSIS — G8929 Other chronic pain: Secondary | ICD-10-CM | POA: Diagnosis present

## 2013-11-18 DIAGNOSIS — G039 Meningitis, unspecified: Secondary | ICD-10-CM | POA: Diagnosis present

## 2013-11-18 DIAGNOSIS — Z9849 Cataract extraction status, unspecified eye: Secondary | ICD-10-CM | POA: Diagnosis not present

## 2013-11-18 DIAGNOSIS — Z9181 History of falling: Secondary | ICD-10-CM | POA: Diagnosis not present

## 2013-11-18 DIAGNOSIS — J449 Chronic obstructive pulmonary disease, unspecified: Secondary | ICD-10-CM | POA: Diagnosis present

## 2013-11-18 DIAGNOSIS — F0390 Unspecified dementia without behavioral disturbance: Secondary | ICD-10-CM | POA: Diagnosis present

## 2013-11-18 DIAGNOSIS — IMO0001 Reserved for inherently not codable concepts without codable children: Secondary | ICD-10-CM

## 2013-11-18 DIAGNOSIS — R509 Fever, unspecified: Secondary | ICD-10-CM

## 2013-11-18 DIAGNOSIS — T814XXA Infection following a procedure, initial encounter: Secondary | ICD-10-CM

## 2013-11-18 DIAGNOSIS — Z9861 Coronary angioplasty status: Secondary | ICD-10-CM | POA: Diagnosis not present

## 2013-11-18 DIAGNOSIS — I2510 Atherosclerotic heart disease of native coronary artery without angina pectoris: Secondary | ICD-10-CM | POA: Diagnosis present

## 2013-11-18 DIAGNOSIS — M069 Rheumatoid arthritis, unspecified: Secondary | ICD-10-CM | POA: Diagnosis present

## 2013-11-18 DIAGNOSIS — G060 Intracranial abscess and granuloma: Secondary | ICD-10-CM | POA: Diagnosis present

## 2013-11-18 DIAGNOSIS — Z96659 Presence of unspecified artificial knee joint: Secondary | ICD-10-CM

## 2013-11-18 DIAGNOSIS — M436 Torticollis: Secondary | ICD-10-CM

## 2013-11-18 DIAGNOSIS — G43719 Chronic migraine without aura, intractable, without status migrainosus: Secondary | ICD-10-CM

## 2013-11-18 DIAGNOSIS — R51 Headache: Secondary | ICD-10-CM

## 2013-11-18 DIAGNOSIS — G44209 Tension-type headache, unspecified, not intractable: Secondary | ICD-10-CM

## 2013-11-18 DIAGNOSIS — J4489 Other specified chronic obstructive pulmonary disease: Secondary | ICD-10-CM | POA: Diagnosis present

## 2013-11-18 DIAGNOSIS — Z8249 Family history of ischemic heart disease and other diseases of the circulatory system: Secondary | ICD-10-CM | POA: Diagnosis not present

## 2013-11-18 DIAGNOSIS — R519 Headache, unspecified: Secondary | ICD-10-CM

## 2013-11-18 DIAGNOSIS — D72829 Elevated white blood cell count, unspecified: Secondary | ICD-10-CM

## 2013-11-18 DIAGNOSIS — R110 Nausea: Secondary | ICD-10-CM

## 2013-11-18 DIAGNOSIS — M4626 Osteomyelitis of vertebra, lumbar region: Secondary | ICD-10-CM | POA: Diagnosis present

## 2013-11-18 DIAGNOSIS — E43 Unspecified severe protein-calorie malnutrition: Secondary | ICD-10-CM | POA: Diagnosis present

## 2013-11-18 DIAGNOSIS — Z96619 Presence of unspecified artificial shoulder joint: Secondary | ICD-10-CM | POA: Diagnosis not present

## 2013-11-18 DIAGNOSIS — M869 Osteomyelitis, unspecified: Secondary | ICD-10-CM

## 2013-11-18 LAB — COMPREHENSIVE METABOLIC PANEL
ALT: 11 U/L (ref 0–53)
AST: 16 U/L (ref 0–37)
Albumin: 3.8 g/dL (ref 3.5–5.2)
Alkaline Phosphatase: 62 U/L (ref 39–117)
Anion gap: 12 (ref 5–15)
BUN: 20 mg/dL (ref 6–23)
CO2: 26 mEq/L (ref 19–32)
Calcium: 9.3 mg/dL (ref 8.4–10.5)
Chloride: 99 mEq/L (ref 96–112)
Creatinine, Ser: 0.84 mg/dL (ref 0.50–1.35)
GFR calc Af Amer: >90 mL/min (ref >90)
GFR calc non Af Amer: 89 mL/min — ABNORMAL LOW (ref >90)
Glucose, Bld: 90 mg/dL (ref 70–99)
Potassium: 4 mEq/L (ref 3.7–5.3)
Sodium: 137 mEq/L (ref 137–147)
Total Bilirubin: 0.5 mg/dL (ref 0.3–1.2)
Total Protein: 6.9 g/dL (ref 6.0–8.3)

## 2013-11-18 LAB — URINALYSIS, ROUTINE W REFLEX MICROSCOPIC
Bilirubin Urine: NEGATIVE
Glucose, UA: NEGATIVE mg/dL
Hgb urine dipstick: NEGATIVE
Ketones, ur: NEGATIVE mg/dL
Leukocytes, UA: NEGATIVE
Nitrite: NEGATIVE
Protein, ur: NEGATIVE mg/dL
Specific Gravity, Urine: 1.013 (ref 1.005–1.030)
Urobilinogen, UA: 0.2 mg/dL (ref 0.0–1.0)
pH: 5.5 (ref 5.0–8.0)

## 2013-11-18 LAB — CBC WITH DIFFERENTIAL/PLATELET
Basophils Absolute: 0 10*3/uL (ref 0.0–0.1)
Basophils Relative: 0 % (ref 0–1)
Eosinophils Absolute: 0 10*3/uL (ref 0.0–0.7)
Eosinophils Relative: 0 % (ref 0–5)
HCT: 42.8 % (ref 39.0–52.0)
Hemoglobin: 14.1 g/dL (ref 13.0–17.0)
Lymphocytes Relative: 21 % (ref 12–46)
Lymphs Abs: 2.3 10*3/uL (ref 0.7–4.0)
MCH: 32.2 pg (ref 26.0–34.0)
MCHC: 32.9 g/dL (ref 30.0–36.0)
MCV: 97.7 fL (ref 78.0–100.0)
Monocytes Absolute: 1.7 10*3/uL — ABNORMAL HIGH (ref 0.1–1.0)
Monocytes Relative: 16 % — ABNORMAL HIGH (ref 3–12)
Neutro Abs: 7.1 10*3/uL (ref 1.7–7.7)
Neutrophils Relative %: 63 % (ref 43–77)
Platelets: 222 10*3/uL (ref 150–400)
RBC: 4.38 MIL/uL (ref 4.22–5.81)
RDW: 13.3 % (ref 11.5–15.5)
WBC: 11.2 10*3/uL — ABNORMAL HIGH (ref 4.0–10.5)

## 2013-11-18 LAB — I-STAT TROPONIN, ED: Troponin i, poc: 0 ng/mL (ref 0.00–0.08)

## 2013-11-18 LAB — LIPASE, BLOOD: Lipase: 35 U/L (ref 11–59)

## 2013-11-18 LAB — I-STAT CG4 LACTIC ACID, ED: Lactic Acid, Venous: 1 mmol/L (ref 0.5–2.2)

## 2013-11-18 MED ORDER — ACETAMINOPHEN 500 MG PO TABS
1000.0000 mg | ORAL_TABLET | Freq: Once | ORAL | Status: AC
  Administered 2013-11-18: 1000 mg via ORAL
  Filled 2013-11-18: qty 2

## 2013-11-18 MED ORDER — HYDROMORPHONE HCL PF 1 MG/ML IJ SOLN
1.5000 mg | INTRAMUSCULAR | Status: DC | PRN
  Administered 2013-11-19 (×2): 1 mg via INTRAMUSCULAR
  Filled 2013-11-18 (×2): qty 2

## 2013-11-18 MED ORDER — SODIUM CHLORIDE 0.9 % IV SOLN
500.0000 mg | Freq: Three times a day (TID) | INTRAVENOUS | Status: DC
  Administered 2013-11-19 – 2013-11-20 (×6): 500 mg via INTRAVENOUS
  Filled 2013-11-18 (×7): qty 500

## 2013-11-18 MED ORDER — POTASSIUM CHLORIDE CRYS ER 20 MEQ PO TBCR
20.0000 meq | EXTENDED_RELEASE_TABLET | Freq: Two times a day (BID) | ORAL | Status: DC
  Administered 2013-11-19 – 2013-11-25 (×14): 20 meq via ORAL
  Filled 2013-11-18 (×16): qty 1

## 2013-11-18 MED ORDER — GADOBENATE DIMEGLUMINE 529 MG/ML IV SOLN
15.0000 mL | Freq: Once | INTRAVENOUS | Status: AC
Start: 2013-11-18 — End: 2013-11-18
  Administered 2013-11-18: 15 mL via INTRAVENOUS

## 2013-11-18 MED ORDER — HYDROMORPHONE HCL PF 1 MG/ML IJ SOLN
1.0000 mg | Freq: Once | INTRAMUSCULAR | Status: AC
  Administered 2013-11-18: 1 mg via INTRAVENOUS
  Filled 2013-11-18: qty 1

## 2013-11-18 MED ORDER — PANTOPRAZOLE SODIUM 40 MG PO TBEC
40.0000 mg | DELAYED_RELEASE_TABLET | Freq: Two times a day (BID) | ORAL | Status: DC
  Administered 2013-11-19 – 2013-11-25 (×14): 40 mg via ORAL
  Filled 2013-11-18 (×14): qty 1

## 2013-11-18 MED ORDER — LOSARTAN POTASSIUM-HCTZ 50-12.5 MG PO TABS: 1.0000 | ORAL_TABLET | Freq: Every day | ORAL | Status: DC

## 2013-11-18 MED ORDER — LOSARTAN POTASSIUM 50 MG PO TABS
50.0000 mg | ORAL_TABLET | Freq: Every day | ORAL | Status: DC
  Administered 2013-11-19 – 2013-11-25 (×6): 50 mg via ORAL
  Filled 2013-11-18 (×7): qty 1

## 2013-11-18 MED ORDER — DEXTROSE-NACL 5-0.45 % IV SOLN
INTRAVENOUS | Status: DC
  Administered 2013-11-19 – 2013-11-23 (×6): via INTRAVENOUS

## 2013-11-18 MED ORDER — METOPROLOL TARTRATE 25 MG PO TABS
25.0000 mg | ORAL_TABLET | Freq: Two times a day (BID) | ORAL | Status: DC
  Administered 2013-11-19 – 2013-11-25 (×12): 25 mg via ORAL
  Filled 2013-11-18 (×16): qty 1

## 2013-11-18 MED ORDER — FLUOXETINE HCL 20 MG PO CAPS
20.0000 mg | ORAL_CAPSULE | Freq: Every day | ORAL | Status: DC
  Administered 2013-11-19 – 2013-11-25 (×7): 20 mg via ORAL
  Filled 2013-11-18 (×7): qty 1

## 2013-11-18 MED ORDER — RIZATRIPTAN BENZOATE 10 MG PO TBDP: 10.0000 mg | ORAL_TABLET | ORAL | Status: DC | PRN

## 2013-11-18 MED ORDER — SUMATRIPTAN SUCCINATE 100 MG PO TABS
100.0000 mg | ORAL_TABLET | ORAL | Status: DC | PRN
  Filled 2013-11-18: qty 1

## 2013-11-18 MED ORDER — ACETAMINOPHEN 325 MG PO TABS
650.0000 mg | ORAL_TABLET | Freq: Four times a day (QID) | ORAL | Status: DC | PRN
  Administered 2013-11-19 – 2013-11-24 (×4): 650 mg via ORAL
  Filled 2013-11-18 (×4): qty 2

## 2013-11-18 MED ORDER — ONDANSETRON HCL 4 MG PO TABS
4.0000 mg | ORAL_TABLET | Freq: Three times a day (TID) | ORAL | Status: DC | PRN
  Administered 2013-11-20 – 2013-11-24 (×5): 4 mg via ORAL
  Filled 2013-11-18 (×5): qty 1

## 2013-11-18 MED ORDER — METHOCARBAMOL 500 MG PO TABS
1000.0000 mg | ORAL_TABLET | Freq: Every day | ORAL | Status: DC
  Administered 2013-11-19 – 2013-11-24 (×7): 1000 mg via ORAL
  Filled 2013-11-18 (×8): qty 2

## 2013-11-18 MED ORDER — DIVALPROEX SODIUM ER 500 MG PO TB24
500.0000 mg | ORAL_TABLET | Freq: Every day | ORAL | Status: DC
  Filled 2013-11-18 (×2): qty 1

## 2013-11-18 MED ORDER — METOCLOPRAMIDE HCL 5 MG/ML IJ SOLN
10.0000 mg | Freq: Once | INTRAMUSCULAR | Status: AC
  Administered 2013-11-18: 10 mg via INTRAVENOUS
  Filled 2013-11-18: qty 2

## 2013-11-18 MED ORDER — SODIUM CHLORIDE 0.9 % IV SOLN
2.0000 g | Freq: Three times a day (TID) | INTRAVENOUS | Status: DC
  Administered 2013-11-19 – 2013-11-20 (×5): 2 g via INTRAVENOUS
  Filled 2013-11-18 (×7): qty 2

## 2013-11-18 MED ORDER — SODIUM CHLORIDE 0.9 % IV BOLUS (SEPSIS)
1000.0000 mL | Freq: Once | INTRAVENOUS | Status: AC
  Administered 2013-11-18: 1000 mL via INTRAVENOUS

## 2013-11-18 MED ORDER — PREDNISONE 10 MG PO TABS
10.0000 mg | ORAL_TABLET | Freq: Every day | ORAL | Status: DC
  Administered 2013-11-19 – 2013-11-25 (×7): 10 mg via ORAL
  Filled 2013-11-18 (×7): qty 1

## 2013-11-18 MED ORDER — KETOROLAC TROMETHAMINE 30 MG/ML IJ SOLN
30.0000 mg | Freq: Once | INTRAMUSCULAR | Status: AC
  Administered 2013-11-18: 30 mg via INTRAVENOUS
  Filled 2013-11-18: qty 1

## 2013-11-18 MED ORDER — KETOROLAC TROMETHAMINE 30 MG/ML IJ SOLN
30.0000 mg | Freq: Once | INTRAMUSCULAR | Status: AC
  Administered 2013-11-18: 30 mg via INTRAVENOUS

## 2013-11-18 MED ORDER — GABAPENTIN 300 MG PO CAPS
300.0000 mg | ORAL_CAPSULE | Freq: Three times a day (TID) | ORAL | Status: DC
  Administered 2013-11-19 – 2013-11-25 (×19): 300 mg via ORAL
  Filled 2013-11-18 (×22): qty 1

## 2013-11-18 MED ORDER — FLUTICASONE PROPIONATE 50 MCG/ACT NA SUSP
2.0000 | Freq: Every day | NASAL | Status: DC
  Administered 2013-11-19 – 2013-11-25 (×7): 2 via NASAL
  Filled 2013-11-18: qty 16

## 2013-11-18 MED ORDER — ALPRAZOLAM 0.5 MG PO TABS
0.5000 mg | ORAL_TABLET | Freq: Every day | ORAL | Status: DC | PRN
  Administered 2013-11-19 – 2013-11-24 (×5): 0.5 mg via ORAL
  Filled 2013-11-18 (×5): qty 1

## 2013-11-18 MED ORDER — DIPHENHYDRAMINE HCL 50 MG/ML IJ SOLN
25.0000 mg | Freq: Once | INTRAMUSCULAR | Status: AC
  Administered 2013-11-18: 25 mg via INTRAVENOUS
  Filled 2013-11-18: qty 1

## 2013-11-18 MED ORDER — SIMVASTATIN 20 MG PO TABS
20.0000 mg | ORAL_TABLET | Freq: Every day | ORAL | Status: DC
  Administered 2013-11-19 – 2013-11-24 (×6): 20 mg via ORAL
  Filled 2013-11-18 (×7): qty 1

## 2013-11-18 MED ORDER — HYDROCHLOROTHIAZIDE 12.5 MG PO CAPS
12.5000 mg | ORAL_CAPSULE | Freq: Every day | ORAL | Status: DC
  Administered 2013-11-19 – 2013-11-25 (×7): 12.5 mg via ORAL
  Filled 2013-11-18 (×7): qty 1

## 2013-11-18 NOTE — ED Notes (Signed)
Pt states that he feels like he is getting chills again dr Darl Householder into see pt

## 2013-11-18 NOTE — ED Notes (Signed)
Lab results was reported to Dr.Yao.

## 2013-11-18 NOTE — Patient Instructions (Signed)
Patient to ED with son.

## 2013-11-18 NOTE — ED Provider Notes (Signed)
Care received from Dr. Tamala Julian at 1500. Please refer to her note for complete history and physical. In brief patient is a 68 y.o. man with past medical history of lumbar back surgery with subsequent infection, presenting for recurrent headaches, fever to 104 since last night, and generalized weakness. Patient has been treated with a migraine cocktail, and has a lumbar MRI attending disposition based on imaging results and change in symptoms with treatment.  Dg Chest 2 View  11/18/2013   CLINICAL DATA:  Dizziness, headache, and hypertension  EXAM: CHEST  2 VIEW  COMPARISON:  PA and lateral chest x-ray of October 29, 2013  FINDINGS: There is chronic elevation of the right hemidiaphragm. There is parenchymal density in the left lateral costophrenic gutter which is more conspicuous today. The visualized portions of the cardiac silhouette appear mildly enlarged. The central pulmonary vascularity is more prominent today. The trachea is midline. 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  IMPRESSION: There is atelectasis versus developing pneumonia at the left lung base. There is mildly increased prominence of the hilar structures especially on the right which may indicate central pulmonary vascular congestion.   Electronically Signed   By: David  Martinique   On: 11/18/2013 16:00   Mr Brain Wo Contrast  11/18/2013   CLINICAL DATA:  Fever with headache. History of migraines. History of hemochromatosis. History of immunosuppressive drugs. History of rheumatoid arthritis  EXAM: MRI HEAD WITHOUT CONTRAST  TECHNIQUE: Multiplanar, multiecho pulse sequences of the brain and surrounding structures were obtained without intravenous contrast.  COMPARISON:  10/30/2013 CT scan. Also MRI scan 06/30/2012 and 09/18/2013.  FINDINGS: No evidence for acute infarction, hemorrhage, mass lesion, hydrocephalus, or extra-axial fluid. Mild to moderate cerebral and cerebellar atrophy. Mild subcortical and periventricular T2 and FLAIR  hyperintensities, likely chronic microvascular ischemic change. Prominent perivascular spaces suggests chronic hypertension. Flow voids are maintained throughout the carotid, basilar, and vertebral arteries. There are no areas of chronic hemorrhage. Pituitary, pineal, and cerebellar tonsils unremarkable. No upper cervical lesions. No pannus. Mild degenerative disc disease. Visualized calvarium, skull base, and upper cervical osseous structures unremarkable. Scalp and extracranial soft tissues, orbits, sinuses, and mastoids show no acute process.  IMPRESSION: Chronic changes as described. No acute intracranial findings. Similar appearance to priors.   Electronically Signed   By: Rolla Flatten M.D.   On: 11/18/2013 20:16   Mr Lumbar Spine W Wo Contrast  11/18/2013   CLINICAL DATA:  Fever, and headache. Immunosuppression. Rheumatoid arthritis.  EXAM: MRI LUMBAR SPINE WITHOUT AND WITH CONTRAST  TECHNIQUE: Multiplanar and multiecho pulse sequences of the lumbar spine were obtained without and with intravenous contrast.  CONTRAST:  67m MULTIHANCE GADOBENATE DIMEGLUMINE 529 MG/ML IV SOLN  COMPARISON:  Lumbar spine radiographs 06/1913. MRI lumbar spine 05/30/2013.  FINDINGS: The patient status post L1 through S1 instrumentation for fusion. Interbody cages placed at L4-5. Severe multilevel disc space narrowing without vertebral body hyperintensity to suggest osteomyelitis. Slight T2 hyperintensity of the L3-4 disc space with minimal enhancement, slightly worse from for priors. Diskitis could have this appearance, although there is no endplate enhancement. Previously noted dorsal seroma is significantly improved.  In the dependent portion of the upper sacral canal/thecal sac, there is abnormal enhancement surrounding the nerve roots forming a meniscus within and around the thecal sac. Detail is obscured in the segments where there is pedicular screw instrumentation, but concern is raised for subarachnoid and subdural  empyema, likely related to a combination of immunosuppression and recent surgery. 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 No distal conus or thoracic cord hyperintensity or visible lower thoracic fluid collection. Unchanged minor disc pathology. No compression of the thecal sac in the L1 through L5 lumbar region with adequate posterior decompression.  IMPRESSION: Enhancing debris in the dependent portion of the thecal sac and upper sacral subdural space consistent with postoperative infection/meningitis/subdural empyema. This has significantly progressed from February 2015. Surgical consultation is warranted.  Slight enhancement of the L3-4 disc space has progressed from priors, equivocal for diskitis.  Findings discussed with responsible caregiver in the ED.   Electronically Signed   By: Rolla Flatten M.D.   On: 11/18/2013 21:06   Lumbar MRI concerning for intrathecal infection. Consult to Dr. Hal Neer with neurosurgery.  He will admit for antibiotic therapy, per ID recommendations and recommends lumbar puncture in the ED. Patient has a history of difficult lumbar puncture due to prior spinal surgery and hardware, and has required radiology LP in the past.  LP attempted in the emergency department by myself and Dr. Alvino Chapel, without success. Contacted radiology for fluoroscopic LP. Service will not be available untill tomorrow, however Radiologist is aware and am Team will followup on LP order.  Patient care was discussed with my attending, Dr. Alvino Chapel.   Hoyle Sauer, MD 11/19/13 [phone]

## 2013-11-18 NOTE — ED Notes (Signed)
neurosurgereon at the bedside.

## 2013-11-18 NOTE — ED Notes (Signed)
Pls call [name] (son) [phone]

## 2013-11-18 NOTE — Progress Notes (Signed)
Patient here seeing [name].  Order for Toradol IM injection 45m.  Under aseptic technique Toradol 325mgiven IM in left gluteal.  Tolerated well.  Bandaid applied.

## 2013-11-18 NOTE — Progress Notes (Addendum)
PATIENT: [name] DOB: 1946-02-24  REASON FOR VISIT: acute follow up for headache HISTORY FROM: patient  HISTORY OF PRESENT ILLNESS: Mr. Mcquistion is a 68 year old right-handed gentleman with an underlying complex medical history of hypertension, osteoarthritis, diverticulosis, hemachromatosis, coronary artery disease, allergic rhinitis, rheumatoid arthritis, and COPD.  Update 11/18/13 (LL): Patient  presents for a sooner than scheduled appointment for headache, he is accompanied by his son. He called the office this morning with report of severe headache that woke him at 2 am, unresponsive to Excedrin Migraine, 2 doses of Rizatriptan, and Robaxin. He also complains of neck stiffness. He started having chills this morning and his temperature here in the office is 101.7 orally. He has photosensitivity and photosensitivity. He has had some nausea without vomiting. He comes to the office for possible Depacon/Solu-Medrol infusion.  Update 11/09/13 (LL): Patient presents for a sooner than scheduled appointment for headache. He was admitted to the hospital on 10/29/13 for severe headache.  CT scan of his head on admission showed no acute intracranial abnormality. He was taking Depakote 250 mg twice a day for headache prevention as well as Imitrex for breakthrough headaches.  He also complained of tightness in tenderness at the base of his neck and in the frontal and temporal regions. Depakote was increased to 500 mg each night, and was treated with one dose of Solu-Medrol 125 mg.  He was discharged on 11/01/13. Since that time, he has been virtually headache-free. He states his headaches are occurring once about every 1-2 weeks.  They ususally last for 24 hours, but have lasted for 10 days straight. He feels like alternating Roabxin and Imitrex usually bring him relief, but one dose of Imitrex only dulls the headache, does not relieve it.  09/20/13 (SA): I have seen him before for altered mental  status and balance problems including evidence of ataxia. He is accompanied by his friend today. I last saw him on 07/26/2013, at which time we discussed his multiple recent hospitalizations. He reported problems with hoarseness and softer speech and I referred him to ENT. I also ordered a swallow study. This was done on 08/11/2013 and showed mild sensory base pharyngeal dysphagia, delayed swallow reflex, penetration and aspiration of thin and nectar thick liquids with no reflexive cough, compensatory positioning with chin tuck was ineffective, however individual small boluses decreased penetration and aspiration. He was advised to followup with speech therapy. I made a referral in that regard. The patient called on 09/06/2013 reporting a two-week headache. Dr. Vickey Huger saw him on my behalf as I was not in the office that week, and he was treated in our office with Depacon infusion as well as IV Solu-Medrol, 500 mg each. He improved. Dr. Vickey Huger ordered a repeat brain MRI and he had a brain MRI with and without contrast on 09/18/2013: Abnormal MRI scan of the brain showing mild changes of chronic microvascular ischemia and generalized cerebral atrophy. Overall no significant change compared with previous MRI scan dated 06/30/2012. In addition, I personally reviewed the images through the PACS system.  Today, he reports, that his headaches started in March 2015 and he reports a typical right sided. He tried Imitrex per PCP about a month ago, and it did not help at 100 mg strength. He recently had a cough and other family members had a cough. He felt, that the IV Depacon helped a lot. He saw Dr. Delford Field in ENT and had a scope. He was told he had no vocal cord  paralysis. He had ST, which helped, with Verdie Mosher. He has noted, that ASA has helped for HAs some. He is on Prednisone 10 mg daily. He has had no recent fevers. Mucinex has helped. His HA currently is a 3/10. He has had some nausea, rare vomiting. He has  some photophobia. It helps to sleep. He has not had any one-sided weakness or numbness or tingling, no slurring of speech or droopy face. He does not have a personal or family history of migraines.  I saw him on 12/15/2012, at which time we talked about previous test results again. We talked about his hospitalization from April 2014. Overall he had improved at the time. Workup had shown increased CSF protein, increased IgG index, positive oligoclonal bands. This of course in the context of autoimmune disease, namely rheumatoid arthritis, on Humira. EEG was negative. We looked for cancer with screening CT abdomen pelvis and chest x-ray. He has been on immune suppressant, Humira. Brain MRI did not show any demyelinating process or mass or enhancing lesions. I considered a second opinion at a larger [facility] and a repeat brain MRI. His last CTH was on 06/30/13: Involutional and chronic changes without evidence of focal or acute abnormalities.  On 03/02/2013 he had lower spine surgery with decompression of the thecal sac and facetectomy, L2-3 discectomy, removal of herniated disc, placement of pedicle screws L1-3 and S1, fusion from L1-S1 involving the L4-5 fusion; this was under Dr. Jeral Fruit. Postoperatively he developed hypotension. He was discharged on 03/09/2013 to a skilled nursing facility. On 03/16/2013 he developed fever and vomiting and was readmitted under neurosurgery. He was found to have hypovolemia, hyponatremia and elevation in BUN and creatinine. On 03/17/2013 he had revision of a lumbar wound.  On 03/19/2013 he was seen by infectious disease for a lumbar wound infection and treatment thereof. He was discharged to home on 04/06/2013 after being treated for a wound infection with IV antibiotics. He presented on 04/13/2013 with fever and hypotension and was sent to the emergency room for further workup and treatment. ENT was consulted for hoarseness and GI was consulted for concern for small bowel  obstruction which was treated conservatively. He was discharged on 04/24/2013. He has had multiple falls with and without injuries, most recently a laceration to his face on 07/01/2013. He has had HH physical therapy and speech therapy. He lives with his son currently. He is supposed to have 24 hour supervision and has not been fully compliant with the use of assistive devices as recommended by home health PT.  He was admitted on 05/06/2013 for AMS, deemed secondary to polypharmacy including opioid overdose, presenting with mental status changes, acute renal failure, acute respiratory failure with hypoxia. He was discharged on 05/09/2013.  He was admitted on 05/23/2013 with generalized weakness, fever, nausea and vomiting. He was transferred to inpatient rehabilitation on 05/27/2013. He was discharged on 06/10/2013.  He was admitted on 06/16/2013 for altered mental status. He was discharged on 06/22/2013. He presented to the emergency room on 06/30/2013 for fever. He presented to the emergency room on 07/01/2013 for a fall.  I saw him on 10/02/2012 after a recent hospitalization.  I first met him on 07/14/2012 at the request of Dr. Yetta Barre. He has an underlying complex medical history including rheumatoid arthritis, osteoarthritis, COPD, atrial fibrillation, anemia, hypertension, heart disease, reported fairly sudden onset unsteadiness of his gait, vertigo, dizziness, blurry vision since early March 2014 there was no significant prodromal illness. His wife had noted some  slurring of speech. Head CT on 06/22/2012 showed no acute abnormality. 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 He also reported some nausea for which meclizine and scopolamine patch helped. I arranged for a hospital admission from clinic because of concerns with cerebellar dysfunction including dysarthria, nystagmus, but also some alteration in his mental status. The patient was not  fully oriented and was sluggish in his responses and seemed confused, talking out of context at times. He had mild dysmetria bilaterally.  CSF showed: increased protein, increased IgG index and positive oligoclonal bands in his CSF. His altered mental status improved and his exam was better and he benefited from physical therapy which he completed. I felt that he had a variety of differential diagnoses possible including cerebellitis, and cephalitis, metabolic-toxic or other immune-paraneoplastic causes of his presentation. I recommended paraneoplastic testing. EEG was normal, chest x-ray was negative, CT abdomen and pelvis were negative. Of note, he has been on immune suppression with Humira. MRI did not show any demyelinating lesions.  His paraneoplastic testing was negative in July 2014. He had foot surgery on the right. He has been on Humira for the past 2 years, [place] injections every other week.   REVIEW OF SYSTEMS: Full 14 system review of systems performed and notable only for: Fatigue, activity change, chills, fever, eye pain, blurred vision, constipation, nausea, frequent waking, joint pain, joint swelling, back pain, walking difficulty, neck pain, neck stiffness.  ALLERGIES: Allergies  Allergen Reactions  . Morphine And Related Shortness Of Breath, Nausea And Vomiting, Swelling and Other (See Comments)    Agitation, tolerates dilaudid  . Penicillins Hives, Shortness Of Breath and Rash    REACTION: hives, breathing problems  . Cefixime     rash  . Percocet [Oxycodone-Acetaminophen] Anxiety    Takes OxyContin at home.    HOME MEDICATIONS: Outpatient Prescriptions Prior to Visit  Medication Sig Dispense Refill  . Adalimumab (HUMIRA PEN [place]) Inject 40 mg into the skin every 14 (fourteen) days. Every other saturday      . ALPRAZolam (XANAX) 0.5 MG tablet Take 1 tablet by mouth daily as needed.      [name] aspirin (ASPIRIN EC) 81 MG EC tablet Take 1 tablet (81 mg total) by mouth every morning.   30 tablet  12  . cholecalciferol (VITAMIN D) 1000 UNITS tablet Take 1 tablet (1,000 Units total) by mouth daily.  100 tablet  3  . DALIRESP 500 MCG TABS tablet Take 1 tablet by mouth daily.      . divalproex (DEPAKOTE ER) 500 MG 24 hr tablet Take 1 tablet (500 mg total) by mouth at bedtime.  90 tablet  2  . FLUoxetine (PROZAC) 20 MG capsule Take 40 mg by mouth daily.      . fluticasone (FLONASE) 50 MCG/ACT nasal spray Place 2 sprays into both nostrils daily.  16 g  5  . gabapentin (NEURONTIN) 300 MG capsule Take 1 capsule (300 mg total) by mouth 3 (three) times daily.  60 capsule  1  . losartan-hydrochlorothiazide (HYZAAR) 50-12.5 MG per tablet Take 1 tablet by mouth daily.  90 tablet  3  . methocarbamol (ROBAXIN) 500 MG tablet Take 500 mg by mouth 2 (two) times daily.      . metoprolol tartrate (LOPRESSOR) 25 MG tablet Take 1 tablet (25 mg total) by mouth 2 (two) times daily.  60 tablet  1  . ondansetron (ZOFRAN) 4 MG tablet Take 1 tablet (4 mg  total) by mouth every 8 (eight) hours as needed for nausea or vomiting.  20 tablet  1  . pantoprazole (PROTONIX) 20 MG tablet Take 2 tablets (40 mg total) by mouth 2 (two) times daily.  60 tablet  11  . potassium chloride SA (KLOR-CON M20) 20 MEQ tablet Take 1 tablet (20 mEq total) by mouth 2 (two) times daily.  60 tablet  5  . predniSONE (DELTASONE) 5 MG tablet Take 1 tablet (5 mg total) by mouth every evening.  30 tablet  1  . rizatriptan (MAXALT-MLT) 10 MG disintegrating tablet Take 1 tablet (10 mg total) by mouth as needed for migraine. May repeat in 2 hours if needed  8 tablet  5  . simvastatin (ZOCOR) 20 MG tablet Take 1 tablet (20 mg total) by mouth at bedtime.  90 tablet  3  . sodium chloride 0.9 % SOLN 100 mL with valproate 500 MG/5ML SOLN 500 mg Inject 500 mg into the vein once.  500 mg  1  . SUMAtriptan (IMITREX) 100 MG tablet Take 100 mg by mouth See admin instructions. TAKE 1 TABLET BY MOUTH EVERY 2 HOURS AS NEEDED FOR MIGRAINE/HEADACHE REPEAT  IN 2 HOURS IF PRESIST       No facility-administered medications prior to visit.    PHYSICAL EXAM Filed Vitals:   11/18/13 1036  BP: 160/98  Pulse: 100  Temp: 101.7 F (38.7 C)  TempSrc: Oral  Height: 5' 6.5" (1.689 m)  Weight: 153 lb 9.6 oz (69.673 kg)   Body mass index is 24.42 kg/(m^2).  General Examination: The patient is a very pleasant 68 y.o. male in acute distress with headache.  HEENT: Normocephalic, atraumatic, pupils are equal, round and reactive to light and accommodation. Funduscopic exam is normal with sharp disc margins noted. He is status post bilateral cataract repairs. Extraocular tracking is fair and actually improved from last time with no nystagmus seen. Hearing is grossly intact. Face is symmetric with normal facial animation and normal facial sensation. Speech is very mildly hypophonic and mildly hoarse. His speech is not consistent with spasmodic dysphonia. There is no lip, neck or jaw tremor. Neck is supple with full range of motion. There are no carotid bruits on auscultation. Oropharynx exam reveals moderate mouth dryness. No significant airway crowding is noted. Mallampati is class II. Tongue protrudes centrally and palate elevates symmetrically. Tongue movements are fine.  Chest: is clear to auscultation without wheezing, rhonchi or crackles noted.  Heart: sounds are regular and normal without murmurs, rubs or gallops noted.  Abdomen: is soft, non-tender and non-distended with normal bowel sounds appreciated on auscultation.  Extremities: There is no pitting edema in the distal lower extremities bilaterally, mostly around the ankles, right more so than left and ankle joint is swollen on the right, fused, not new. Pedal pulses are intact.  Skin: is warm and dry with no trophic changes noted.  Musculoskeletal: exam reveals heberden nodes in bilateral hands from RA.  R hip pain and decreasing range of motion in both shoulders, left worse than right.    Neurologically:  Mental status: The patient is awake, paying attention. He is fully oriented, and able to give a fairly good history. His memory, attention, language and knowledge are fairly well preserved, but he needs help with chronological relationship history by his family. Pain is elicited by touching chin to chest. Cranial nerves are as described above under HEENT exam. Motor exam: Normal bulk, and strength is globally 4+/5. Tone seems normal. There is  no drift, or tremor, no rebound. Reflexes are 1+ throughout. Fine motor skills are mildly impaired, bilaterally. On finger to nose testing he has very mild dysmetria bilaterally, right more than left with left almost normal. He has no significant intention tremor. There is no truncal ataxia.  Sensory exam is intact to light touch. Gait, station and balance: He stands up with mild difficulty and only requires one attempt. His stance is mildly wide-based. He walks without assistance and slightly wide-based. He is unable to do tandem walk  ASSESSMENT: In summary, SIDDH CRENSHAW is a very pleasant 68 year old male with a complex medical history of hypertension, osteoarthritis, diverticulosis, hemachromatosis, coronary artery disease, allergic rhinitis, rheumatoid arthritis, and COPD, who presents for a sooner than scheduled appointment for severe headache, Headaches have been a new problem which began in March of this year. He was hospitalized just 3 weeks ago for severe headache with fever.  He called the office this morning with report of severe headache that woke him at 2 am, unresponsive to Excedrin Migraine, 2 doses of Rizatriptan, and Robaxin. He also complains of neck stiffness. He started having chills this morning and his temperature here in the office is 101.7 orally.  He has been on Humira up until 1 month ago, with plans to switch to Enbrel, and he is on prednisone 10 mg daily. MRI from March 2014 and repeat brain MRI with and without  contrast in June did not show any demyelinating process or mass or enhancing lesion. He has had significant improvement with IV Depacon recently. He is taking Depakote ER 500 mg nightly as a preventative.    I consulted with Dr. Frances Furbish and she feels that it is best to send the patient to the ER since he is febrile, with severe headache, neck stiffness, and a history of long-term immunosuppressants.   PLAN:  Toradol 30 mg IM now and son to take to ER for evaluation. ? Meningitis.  He is to continue Depakote ER 500 mg each night.  Continue Zofran 4 mg when necessary for nausea associated with headache.   Continue Maxalt 10 mg when necessary for acute migraine, since he had only limited relief with Imitrex. He may also use Robaxin when necessary for headache and muscle tightness.  Dr. Frances Furbish will see him back in October. He is encouraged to call with any interim questions, concerns or problems. He was in agreement.  He was given headache diary to track headaches and bring to next visit.  Tawny Asal Corynn Solberg, MSN, FNP-BC, A/GNP-C 11/18/2013, 11:46 AM [facility] [address] [place], [place] [address] [phone]  Note: This document was prepared with digital dictation and possible smart phrase technology. Any transcriptional errors that result from this process are unintentional.  I reviewed the above note and documentation by the Nurse Practitioner and agree with the history, physical exam, assessment and plan as outlined above. Huston Foley, MD, PhD [facility])

## 2013-11-18 NOTE — ED Notes (Signed)
Patient states he has had a migraine since this morning at 200a.m.   Patient states took prescription medication for the headache with no relief.  Patient states he was here 3 x weeks ago for elevated bp, migraine, nausea and fever.   Patient went to neurology this morning and they sent patient here for fever, nausea with headache.  Patient afebrile in triage.

## 2013-11-18 NOTE — Telephone Encounter (Signed)
Spoke with Kennyth Lose, RN in [place]. Notified her that saw pt in office and he will be coming there for evaluation: with severe headache, fever, neck stiffness, longterm use of immunosuppressive drugs.  Off humira for one month, was to start enbrel.  Ofv note in EPIC.

## 2013-11-18 NOTE — H&P (Signed)
[name] is an 68 y.o. male.   Chief Complaint: Possible spinal infection HPI: 68 year old male with history of back surgery and post op infection by Dr Joya Salm many months ago. He was recently admitted for headache, and got better and was discharged. This morning, he developed recurrwent headache, and moderate fever. He came to [facility] for eval, and MRI of the lumbar spine was done. He has been complaining of back pain for 2 months. The MRI showed a lot of post op change, but below L5 S1 there was an enhancing fluid collection within the dura, etiology/diagnosis unknown. Neurosurgical consult requested.  Past Medical History  Diagnosis Date  . Hx of colonic polyps   . Diverticulosis   . Hypertension   . 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 CAD (coronary artery disease)     minimal coronary plaque in the LAD and right coronary system. 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 7-06. Last nuclear stress 11-17-06 fixed anterior/ inferior defect, no inducible ischemia, EF 81%  . Allergic rhinitis   . Hx of colonoscopy   . Dysrhythmia 01-24-12    past hx. A.Fib x1 episode-responded to med.  . Depression   . GERD (gastroesophageal reflux disease)   . COPD (chronic obstructive pulmonary disease)     pt denies this hx on 05/24/2013  . Osteoarthritis   . RA (rheumatoid arthritis)   . Chronic back pain     "all over back" (06/16/2013)  . PONV (postoperative nausea and vomiting)   . High cholesterol   . Sleep apnea     "never dx'd; snores bad" (06/16/2013)  . Daily headache   . Falls frequently     "since 02/2013" (06/16/2013)  . Anxiety   . Narcotic abuse     per family  . Alcoholism /alcohol abuse     per family  . Dementia     Past Surgical History  Procedure Laterality Date  . Appendectomy    . Total knee arthroplasty Right 2002     partial  . Shoulder  hemi-arthroplasty Left 2008    partial  . Foot surgery Right 11-08    for removal of bone spurs-  . Ankle reconstruction Right 6-09    [facility]  . Posterior lumbar fusion  12-10    L4-5 diskectomy w/ fusion, cage placement and rods; [facility]  . Partial knee arthroplasty Left   . Total shoulder replacement Right   . Joint replacement    . Cataract extraction, bilateral Bilateral 01-24-12  . Orif shoulder fracture  02/06/2012    Procedure: OPEN REDUCTION INTERNAL FIXATION (ORIF) SHOULDER FRACTURE;  Surgeon: Nita Sells, MD;  Location: [facility];  Service: Orthopedics;  Laterality: Left;  ORIF of a Left Shoulder Fracture with  Iliac Crest Bone Graft aspiration   . Harvest bone graft  02/06/2012    Procedure: HARVEST ILIAC BONE GRAFT;  Surgeon: Nita Sells, MD;  Location: [facility];  Service: Orthopedics;;  bone marrow aspirqation   . Hardware removal  03/09/2012    Procedure: HARDWARE REMOVAL;  Surgeon: Nita Sells, MD;  Location: [facility];  Service: Orthopedics;  Laterality: Left;  Hardware Removal from Left Shoulder  . Shoulder arthroscopy Right 2013  . Carpal tunnel release Right 1990's  . Total ankle replacement Right 2008    at [facility]  . Hammer toe  surgery Right 07/2012    "broke 4 hammertoes"   . Posterior lumbar fusion 4 level N/A 03/02/2013    Procedure: Lumbar One to Sacral One Posterior lumbar interbody fusion;  Surgeon: Floyce Stakes, MD;  Location: [place];  Service: Neurosurgery;  Laterality: N/A;  L1 to S1 Posterior lumbar interbody fusion  . Lumbar wound debridement N/A 03/17/2013    Procedure: Incision and drainage of superficial lumbar wound;  Surgeon: Floyce Stakes, MD;  Location: [place] [facility];  Service: Neurosurgery;  Laterality: N/A;  Incision and drainage of superficial lumbar wound  . Tonsillectomy    . Coronary angioplasty with stent placement  2006    "while repairing 1st stent, a second area tore and they had to  place 2nd stent " ?LAD & CX  . Inguinal hernia repair Bilateral     Family History  Problem Relation Age of Onset  . Uterine cancer Mother     survivor  . Macular degeneration Mother   . Other Mother     ankle edema  . Lung cancer Mother   . Cancer Mother     ovarian, lung  . Coronary artery disease Father   . Hypertension Father   . Prostate cancer Father   . Colon polyps Father   . Cancer Father     prostate  . Thyroid disease Sister   . Heart attack Brother   . Hyperlipidemia Brother   . Other Brother     Schizophrenic  . Diabetes Neg Hx   . Colon cancer Neg Hx   . Esophageal cancer Neg Hx   . Rectal cancer Neg Hx   . Stomach cancer Neg Hx   . Coronary artery disease Maternal Aunt   . Heart attack Maternal Aunt   . Rheum arthritis Sister   . Hemochromatosis Sister    Social History:  reports that he has never smoked. He has never used smokeless tobacco. He reports that he does not drink alcohol or use illicit drugs.  Allergies:  Allergies  Allergen Reactions  . Morphine And Related Shortness Of Breath, Nausea And Vomiting, Swelling and Other (See Comments)    Agitation, tolerates dilaudid  . Penicillins Hives, Shortness Of Breath and Rash    REACTION: hives, breathing problems  . Cefixime     rash  . Percocet [Oxycodone-Acetaminophen] Anxiety    Takes OxyContin at home.     (Not in a hospital admission)  Results for orders placed during the hospital encounter of 11/18/13 (from the past 48 hour(s))  CBC WITH DIFFERENTIAL     Status: Abnormal   Collection Time    11/18/13  2:08 PM      Result Value Ref Range   WBC 11.2 (*) 4.0 - 10.5 K/uL   RBC 4.38  4.22 - 5.81 MIL/uL   Hemoglobin 14.1  13.0 - 17.0 g/dL   HCT 42.8  39.0 - 52.0 %   MCV 97.7  78.0 - 100.0 fL   MCH 32.2  26.0 - 34.0 pg   MCHC 32.9  30.0 - 36.0 g/dL   RDW 13.3  11.5 - 15.5 %   Platelets 222  150 - 400 K/uL   Neutrophils Relative % 63  43 - 77 %   Neutro Abs 7.1  1.7 - 7.7 K/uL    Lymphocytes Relative 21  12 - 46 %   Lymphs Abs 2.3  0.7 - 4.0 K/uL   Monocytes Relative 16 (*) 3 - 12 %  Monocytes Absolute 1.7 (*) 0.1 - 1.0 K/uL   Eosinophils Relative 0  0 - 5 %   Eosinophils Absolute 0.0  0.0 - 0.7 K/uL   Basophils Relative 0  0 - 1 %   Basophils Absolute 0.0  0.0 - 0.1 K/uL  COMPREHENSIVE METABOLIC PANEL     Status: Abnormal   Collection Time    11/18/13  2:08 PM      Result Value Ref Range   Sodium 137  137 - 147 mEq/L   Potassium 4.0  3.7 - 5.3 mEq/L   Chloride 99  96 - 112 mEq/L   CO2 26  19 - 32 mEq/L   Glucose, Bld 90  70 - 99 mg/dL   BUN 20  6 - 23 mg/dL   Creatinine, Ser 0.84  0.50 - 1.35 mg/dL   Calcium 9.3  8.4 - 10.5 mg/dL   Total Protein 6.9  6.0 - 8.3 g/dL   Albumin 3.8  3.5 - 5.2 g/dL   AST 16  0 - 37 U/L   ALT 11  0 - 53 U/L   Alkaline Phosphatase 62  39 - 117 U/L   Total Bilirubin 0.5  0.3 - 1.2 mg/dL   GFR calc non Af Amer 89 (*) >90 mL/min   GFR calc Af Amer >90  >90 mL/min   Comment: (NOTE)     The eGFR has been calculated using the CKD EPI equation.     This calculation has not been validated in all clinical situations.     eGFR's persistently <90 mL/min signify possible Chronic Kidney     Disease.   Anion gap 12  5 - 15  LIPASE, BLOOD     Status: None   Collection Time    11/18/13  2:08 PM      Result Value Ref Range   Lipase 35  11 - 59 U/L  I-STAT TROPOININ, ED     Status: None   Collection Time    11/18/13  2:35 PM      Result Value Ref Range   Troponin i, poc 0.00  0.00 - 0.08 ng/mL   Comment 3            Comment: Due to the release kinetics of cTnI,     a negative result within the first hours     of the onset of symptoms does not rule out     myocardial infarction with certainty.     If myocardial infarction is still suspected,     repeat the test at appropriate intervals.  I-STAT CG4 LACTIC ACID, ED     Status: None   Collection Time    11/18/13  3:41 PM      Result Value Ref Range   Lactic Acid, Venous 1.00   0.5 - 2.2 mmol/L  URINALYSIS, ROUTINE W REFLEX MICROSCOPIC     Status: None   Collection Time    11/18/13  6:35 PM      Result Value Ref Range   Color, Urine YELLOW  YELLOW   APPearance CLEAR  CLEAR   Specific Gravity, Urine 1.013  1.005 - 1.030   pH 5.5  5.0 - 8.0   Glucose, UA NEGATIVE  NEGATIVE mg/dL   Hgb urine dipstick NEGATIVE  NEGATIVE   Bilirubin Urine NEGATIVE  NEGATIVE   Ketones, ur NEGATIVE  NEGATIVE mg/dL   Protein, ur NEGATIVE  NEGATIVE mg/dL   Urobilinogen, UA 0.2  0.0 - 1.0 mg/dL  Nitrite NEGATIVE  NEGATIVE   Leukocytes, UA NEGATIVE  NEGATIVE   Comment: MICROSCOPIC NOT DONE ON URINES WITH NEGATIVE PROTEIN, BLOOD, LEUKOCYTES, NITRITE, OR GLUCOSE <1000 mg/dL.   Dg Chest 2 View  11/18/2013   CLINICAL DATA:  Dizziness, headache, and hypertension  EXAM: CHEST  2 VIEW  COMPARISON:  PA and lateral chest x-ray of October 29, 2013  FINDINGS: There is chronic elevation of the right hemidiaphragm. There is parenchymal density in the left lateral costophrenic gutter which is more conspicuous today. The visualized portions of the cardiac silhouette appear mildly enlarged. The central pulmonary vascularity is more prominent today. The trachea is midline. 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  IMPRESSION: There is atelectasis versus developing pneumonia at the left lung base. There is mildly increased prominence of the hilar structures especially on the right which may indicate central pulmonary vascular congestion.   Electronically Signed   By: David  Martinique   On: 11/18/2013 16:00   Mr Brain Wo Contrast  11/18/2013   CLINICAL DATA:  Fever with headache. History of migraines. History of hemochromatosis. History of immunosuppressive drugs. History of rheumatoid arthritis  EXAM: MRI HEAD WITHOUT CONTRAST  TECHNIQUE: Multiplanar, multiecho pulse sequences of the brain and surrounding structures were obtained without intravenous contrast.  COMPARISON:  10/30/2013 CT scan. Also MRI scan  06/30/2012 and 09/18/2013.  FINDINGS: No evidence for acute infarction, hemorrhage, mass lesion, hydrocephalus, or extra-axial fluid. Mild to moderate cerebral and cerebellar atrophy. Mild subcortical and periventricular T2 and FLAIR hyperintensities, likely chronic microvascular ischemic change. Prominent perivascular spaces suggests chronic hypertension. Flow voids are maintained throughout the carotid, basilar, and vertebral arteries. There are no areas of chronic hemorrhage. Pituitary, pineal, and cerebellar tonsils unremarkable. No upper cervical lesions. No pannus. Mild degenerative disc disease. Visualized calvarium, skull base, and upper cervical osseous structures unremarkable. Scalp and extracranial soft tissues, orbits, sinuses, and mastoids show no acute process.  IMPRESSION: Chronic changes as described. No acute intracranial findings. Similar appearance to priors.   Electronically Signed   By: Rolla Flatten M.D.   On: 11/18/2013 20:16   Mr Lumbar Spine W Wo Contrast  11/18/2013   CLINICAL DATA:  Fever, and headache. Immunosuppression. Rheumatoid arthritis.  EXAM: MRI LUMBAR SPINE WITHOUT AND WITH CONTRAST  TECHNIQUE: Multiplanar and multiecho pulse sequences of the lumbar spine were obtained without and with intravenous contrast.  CONTRAST:  46m MULTIHANCE GADOBENATE DIMEGLUMINE 529 MG/ML IV SOLN  COMPARISON:  Lumbar spine radiographs 06/1913. MRI lumbar spine 05/30/2013.  FINDINGS: The patient status post L1 through S1 instrumentation for fusion. Interbody cages placed at L4-5. Severe multilevel disc space narrowing without vertebral body hyperintensity to suggest osteomyelitis. Slight T2 hyperintensity of the L3-4 disc space with minimal enhancement, slightly worse from for priors. Diskitis could have this appearance, although there is no endplate enhancement. Previously noted dorsal seroma is significantly improved.  In the dependent portion of the upper sacral canal/thecal sac, there is abnormal  enhancement surrounding the nerve roots forming a meniscus within and around the thecal sac. Detail is obscured in the segments where there is pedicular screw instrumentation, but concern is raised for subarachnoid and subdural empyema, likely related to a combination of immunosuppression and recent surgery. 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 No distal conus or thoracic cord hyperintensity or visible lower thoracic fluid collection. Unchanged minor disc pathology. No compression of the thecal sac in the L1 through L5 lumbar region with adequate posterior decompression.  IMPRESSION: Enhancing debris in the dependent  portion of the thecal sac and upper sacral subdural space consistent with postoperative infection/meningitis/subdural empyema. This has significantly progressed from February 2015. Surgical consultation is warranted.  Slight enhancement of the L3-4 disc space has progressed from priors, equivocal for diskitis.  Findings discussed with responsible caregiver in the ED.   Electronically Signed   By: Rolla Flatten M.D.   On: 11/18/2013 21:06    Pertinent items are noted in HPI.  Blood pressure 108/70, pulse 71, temperature 99.3 F (37.4 C), temperature source Oral, resp. rate 21, SpO2 95.00%.  Awake, alert, conversant. Strength 5/5, sensation intact. Moderate lower back pain, but not severe to percussion. Assessment/Plan MRI reviewed and shows the fluid collection as above. Have discussed situaion with one of my associates ( Dr Arnoldo Morale). We have discussed the films. The patient is intact neurologically, with no weakness or numbness. He has had multiple back procedures by Dr Joya Salm. We think that the best course would be admission, LP per ED MD, and broad spectrum anti biotics. I have spoken to ID and they have recommended that we start Vanco and Meropenem. As long as he does well, and remains stable, we will continue that treatment and let Dr Joya Salm determine the need for any  additional intervention based on his response.  Faythe Ghee, MD 11/18/2013, 11:03 PM

## 2013-11-18 NOTE — ED Notes (Signed)
Per [place] neurology; using immunosuppressants for RA but haS BEEN off for 1 month

## 2013-11-18 NOTE — Progress Notes (Signed)
ANTIBIOTIC CONSULT NOTE - INITIAL  Pharmacy Consult for vancomycin and meropenem Indication: Spinal infection, possible meningitis  Allergies  Allergen Reactions  . Morphine And Related Shortness Of Breath, Nausea And Vomiting, Swelling and Other (See Comments)    Agitation, tolerates dilaudid  . Penicillins Hives, Shortness Of Breath and Rash    REACTION: hives, breathing problems  . Cefixime     rash  . Percocet [Oxycodone-Acetaminophen] Anxiety    Takes OxyContin at home.    Patient Measurements: Height: 5' 6.53" (169 cm) Weight: 154 lb 5.2 oz (70 kg) IBW/kg (Calculated) : 65.03   Vital Signs: Temp: 99.3 F (37.4 C) (08/13 2106) Temp src: Oral (08/13 2106) BP: 123/68 mmHg (08/13 2300) Pulse Rate: 81 (08/13 2300) Intake/Output from previous day:   Intake/Output from this shift:    Labs:  Recent Labs  11/18/13 1408  WBC 11.2*  HGB 14.1  PLT 222  CREATININE 0.84   Estimated Creatinine Clearance: 79.5 ml/min (by C-G formula based on Cr of 0.84). No results found for this basename: VANCOTROUGH, Corlis Leak, VANCORANDOM, GENTTROUGH, GENTPEAK, GENTRANDOM, TOBRATROUGH, TOBRAPEAK, TOBRARND, AMIKACINPEAK, AMIKACINTROU, AMIKACIN,  in the last 72 hours   Microbiology: Recent Results (from the past 720 hour(s))  CULTURE, BLOOD (ROUTINE X 2)     Status: None   Collection Time    10/29/13  8:30 PM      Result Value Ref Range Status   Specimen Description BLOOD ARM RIGHT   Final   Special Requests BOTTLES DRAWN AEROBIC AND ANAEROBIC [facility]   Final   Culture  Setup Time     Final   Value: 10/30/2013 05:11     Performed at [facility]   Culture     Final   Value: NO GROWTH 5 DAYS     Performed at [facility]   Report Status 11/05/2013 FINAL   Final  CULTURE, BLOOD (ROUTINE X 2)     Status: None   Collection Time    10/29/13  8:37 PM      Result Value Ref Range Status   Specimen Description BLOOD ARM LEFT   Final   Special Requests BOTTLES DRAWN AEROBIC  AND ANAEROBIC 10CC   Final   Culture  Setup Time     Final   Value: 10/30/2013 05:11     Performed at [facility]   Culture     Final   Value: NO GROWTH 5 DAYS     Performed at [facility]   Report Status 11/05/2013 FINAL   Final  URINE CULTURE     Status: None   Collection Time    10/30/13  4:33 PM      Result Value Ref Range Status   Specimen Description URINE, CLEAN CATCH   Final   Special Requests NONE   Final   Culture  Setup Time     Final   Value: 10/30/2013 21:12     Performed at [place]     Final   Value: 3,000 COLONIES/ML     Performed at [facility]   Culture     Final   Value: INSIGNIFICANT GROWTH     Performed at [facility]   Report Status 10/31/2013 FINAL   Final    Medical History: Past Medical History  Diagnosis Date  . Hx of colonic polyps   . Diverticulosis   . Hypertension   . Hemochromatosis     dx'd 14 yrs  ago last ferritin Aug 11, 08 52 (22-322), Fe 136  . CAD (coronary artery disease)     minimal coronary plaque in the LAD and right coronary system. 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 7-06. Last nuclear stress 11-17-06 fixed anterior/ inferior defect, no inducible ischemia, EF 81%  . Allergic rhinitis   . Hx of colonoscopy   . Dysrhythmia 01-24-12    past hx. A.Fib x1 episode-responded to med.  . Depression   . GERD (gastroesophageal reflux disease)   . COPD (chronic obstructive pulmonary disease)     pt denies this hx on 05/24/2013  . Osteoarthritis   . RA (rheumatoid arthritis)   . Chronic back pain     "all over back" (06/16/2013)  . PONV (postoperative nausea and vomiting)   . High cholesterol   . Sleep apnea     "never dx'd; snores bad" (06/16/2013)  . Daily headache   . Falls frequently     "since 02/2013" (06/16/2013)  . Anxiety   . Narcotic abuse     per family  . Alcoholism /alcohol abuse     per  family  . Dementia     Medications:  See EMR  Assessment: 68 year old male with history of back surgery and post-op infection, now with headache and fever, fluid collection on MRI. Neurosurgery has spoken with ID and now received orders to start empiric vancomycin and meropenem. Patient has several allergies but appears to have tolerated primaxin and cefepime in the past so should not have problems with meropenem. Tmax 101.7, wbc 11, renal function normal.  Patient was supratherapeutic on vancomycin 750 q8 and therapeutic on 500q8 in past admission.  Goal of Therapy:  Vancomycin trough level 15-20 mcg/ml  Plan:  Measure antibiotic drug levels at steady state Follow up culture results Meropenem 2g q8 hours - meningitis dosing Vancomycin 537m q8 hours   FErin HearingPharmD., BCPS Clinical Pharmacist Pager [phone]/13/2015 11:37 PM

## 2013-11-18 NOTE — ED Notes (Signed)
Pts daughters April's  phone number is [phone].

## 2013-11-18 NOTE — ED Notes (Signed)
Pt states he had surgery 03/02/13 and then got infection and had surgery to clean that up in jan and stayed till march . H/a  Started in march  Fever started  3 weeks ago and was seen here and got admitted  and then saw his dr today and neuro and had a fever of 102.7 when he got there has been sent for further tests

## 2013-11-18 NOTE — Telephone Encounter (Signed)
Patient has very severe migraine this am.  Was instructed by Jeani Hawking, NP at last visit to call first thing to see if we could fit him it for IV meds.  Please return call.

## 2013-11-18 NOTE — Patient Instructions (Addendum)
PLAN:  Toradol 30 mg IM now and son to take to ER for evaluation.   He is to continue Depakote ER 500 mg each night.  Continue Zofran 4 mg when necessary for nausea associated with headache.   Continue Maxalt 10 mg when necessary for acute migraine, since he had only limited relief with Imitrex. He may also use Robaxin when necessary for headache and muscle tightness.  Dr. Rexene Alberts will see him back in October. He is encouraged to call with any interim questions, concerns or problems.   He was given headache diary to track headaches and bring to next visit.

## 2013-11-18 NOTE — ED Notes (Signed)
Pt to xray at this time.

## 2013-11-18 NOTE — Telephone Encounter (Signed)
I called pt and he stated woke up with migraine at 0200 this am.  Took exedrin which did not help.  Took maxalt x 2 and also did not help.  Pt stated at level 8, R >L temple pain/ behind eyes.  Pt states is hydrated.  Other sx of chills and aches, fever?  (thermometer is broken, feels warm).  Viral?  Consulted Charlott Holler, NP and will have pt come in for evaluation prior to infusion.  Confirmed appt for 1030 today with Jeani Hawking.[name]

## 2013-11-18 NOTE — ED Provider Notes (Signed)
CSN: [id number]     Arrival date & time 11/18/13  1134 History   First MD Initiated Contact with Patient 11/18/13 1406     Chief Complaint  Patient presents with  . Migraine  . Nausea   [name] is a 68 yo caucasian male w/PMH of HTN, hemochromatosis, RA on prednisone s/p several orthopedic surgeries/joint replacements (lumbar fusion, bilateral knee and right ankle replacements), CAD s/p stent, OSA, and migraines who presents w/HA and fever. Pt was here 3 weeks ago w/similar sx and no source of infection found at that time. He endorses mild back pain, nausea, and headache. Headache has been constant today and feels like typical migraine w/temples and frontal pain: 8/10. No neck pain or stiffness. No AMS or focal neural deficits. Patient presented to his PCP this morning who found him to be febrile, and sent here for further evaluation and workup.  He denies CP, SOB, fever, chills, vomiting, diarrhea, constipation, hematemesis, dysuria, hematuria, sick contacts, or recent travel.   (Consider location/radiation/quality/duration/timing/severity/associated sxs/prior Treatment) Patient is a 68 y.o. male presenting with migraines.  Migraine This is a new problem. The current episode started today. The problem occurs intermittently. The problem has been unchanged. Associated symptoms include chills, a fever, headaches and nausea. Pertinent negatives include no abdominal pain, chest pain, congestion, coughing, diaphoresis, fatigue, myalgias, neck pain, numbness, sore throat, swollen glands, urinary symptoms, visual change, vomiting or weakness.    Past Medical History  Diagnosis Date  . Hx of colonic polyps   . Diverticulosis   . Hypertension   . 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 CAD (coronary artery disease)     minimal coronary plaque in the LAD and right coronary system. PCI of a 95% obtuse marginal lesion w/ resultant spiral  dissection requiring drug-eluting stent placement. 7-06. Last nuclear stress 11-17-06 fixed anterior/ inferior defect, no inducible ischemia, EF 81%  . Allergic rhinitis   . Hx of colonoscopy   . Dysrhythmia 01-24-12    past hx. A.Fib x1 episode-responded to med.  . Depression   . GERD (gastroesophageal reflux disease)   . COPD (chronic obstructive pulmonary disease)     pt denies this hx on 05/24/2013  . Osteoarthritis   . RA (rheumatoid arthritis)   . Chronic back pain     "all over back" (06/16/2013)  . PONV (postoperative nausea and vomiting)   . High cholesterol   . Sleep apnea     "never dx'd; snores bad" (06/16/2013)  . Daily headache   . Falls frequently     "since 02/2013" (06/16/2013)  . Anxiety   . Narcotic abuse     per family  . Alcoholism /alcohol abuse     per family  . Dementia    Past Surgical History  Procedure Laterality Date  . Appendectomy    . Total knee arthroplasty Right 2002     partial  . Shoulder hemi-arthroplasty Left 2008    partial  . Foot surgery Right 11-08    for removal of bone spurs-  . Ankle reconstruction Right 6-09    [facility]  . Posterior lumbar fusion  12-10    L4-5 diskectomy w/ fusion, cage placement and rods; [place]  . Partial knee arthroplasty Left   . Total shoulder replacement Right   . Joint replacement    . Cataract extraction, bilateral Bilateral 01-24-12  . Orif shoulder fracture  02/06/2012  Procedure: OPEN REDUCTION INTERNAL FIXATION (ORIF) SHOULDER FRACTURE;  Surgeon: Nita Sells, MD;  Location: [facility];  Service: Orthopedics;  Laterality: Left;  ORIF of a Left Shoulder Fracture with  Iliac Crest Bone Graft aspiration   . Harvest bone graft  02/06/2012    Procedure: HARVEST ILIAC BONE GRAFT;  Surgeon: Nita Sells, MD;  Location: [facility];  Service: Orthopedics;;  bone marrow aspirqation   . Hardware removal  03/09/2012    Procedure: HARDWARE REMOVAL;  Surgeon: Nita Sells, MD;  Location:  [place];  Service: Orthopedics;  Laterality: Left;  Hardware Removal from Left Shoulder  . Shoulder arthroscopy Right 2013  . Carpal tunnel release Right 1990's  . Total ankle replacement Right 2008    at [facility]  . Hammer toe surgery Right 07/2012    "broke 4 hammertoes"   . Posterior lumbar fusion 4 level N/A 03/02/2013    Procedure: Lumbar One to Sacral One Posterior lumbar interbody fusion;  Surgeon: Floyce Stakes, MD;  Location: [place] [facility];  Service: Neurosurgery;  Laterality: N/A;  L1 to S1 Posterior lumbar interbody fusion  . Lumbar wound debridement N/A 03/17/2013    Procedure: Incision and drainage of superficial lumbar wound;  Surgeon: Floyce Stakes, MD;  Location: [place] [facility];  Service: Neurosurgery;  Laterality: N/A;  Incision and drainage of superficial lumbar wound  . Tonsillectomy    . Coronary angioplasty with stent placement  2006    "while repairing 1st stent, a second area tore and they had to place 2nd stent " ?LAD & CX  . Inguinal hernia repair Bilateral    Family History  Problem Relation Age of Onset  . Uterine cancer Mother     survivor  . Macular degeneration Mother   . Other Mother     ankle edema  . Lung cancer Mother   . Cancer Mother     ovarian, lung  . Coronary artery disease Father   . Hypertension Father   . Prostate cancer Father   . Colon polyps Father   . Cancer Father     prostate  . Thyroid disease Sister   . Heart attack Brother   . Hyperlipidemia Brother   . Other Brother     Schizophrenic  . Diabetes Neg Hx   . Colon cancer Neg Hx   . Esophageal cancer Neg Hx   . Rectal cancer Neg Hx   . Stomach cancer Neg Hx   . Coronary artery disease Maternal Aunt   . Heart attack Maternal Aunt   . Rheum arthritis Sister   . Hemochromatosis Sister    History  Substance Use Topics  . Smoking status: Never Smoker   . Smokeless tobacco: Never Used  . Alcohol Use: No     Comment: 06/16/2013 "used to drink alot; was  pretty bad about it; don't think he's had any alcohol in the past year"    Review of Systems  Constitutional: Positive for fever and chills. Negative for diaphoresis and fatigue.  HENT: Negative for congestion and sore throat.   Respiratory: Negative for cough and shortness of breath.   Cardiovascular: Negative for chest pain, palpitations and leg swelling.  Gastrointestinal: Positive for nausea. Negative for vomiting, abdominal pain, diarrhea, constipation and abdominal distention.  Genitourinary: Negative for dysuria, frequency, flank pain and decreased urine volume.  Musculoskeletal: Negative for myalgias and neck pain.  Neurological: Positive for headaches. Negative for dizziness, speech difficulty, weakness, light-headedness and numbness.  All other systems reviewed and are negative.     Allergies  Morphine and related; Penicillins; Cefixime; and Percocet  Home Medications   Prior to Admission medications   Medication Sig Start Date End Date Taking? Authorizing Provider  ALPRAZolam Duanne Moron) 0.5 MG tablet Take 1 tablet by mouth daily as needed for anxiety.  10/29/13  Yes Historical Provider, MD  aspirin (ASPIRIN EC) 81 MG EC tablet Take 1 tablet (81 mg total) by mouth every morning. 06/10/13  Yes Daniel J Angiulli, PA-C  cholecalciferol (VITAMIN D) 1000 UNITS tablet Take 1 tablet (1,000 Units total) by mouth daily. 06/10/13  Yes Daniel J Angiulli, PA-C  DALIRESP 500 MCG TABS tablet Take 1 tablet by mouth daily. 09/23/13  Yes Historical Provider, MD  divalproex (DEPAKOTE ER) 500 MG 24 hr tablet Take 1 tablet (500 mg total) by mouth at bedtime. 11/09/13  Yes Philmore Pali, NP  FLUoxetine (PROZAC) 20 MG capsule Take 20 mg by mouth daily.  06/19/13  Yes Domenic Polite, MD  fluticasone (FLONASE) 50 MCG/ACT nasal spray Place 2 sprays into both nostrils daily. 11/15/13  Yes Aleksei Plotnikov V, MD  gabapentin (NEURONTIN) 300 MG capsule Take 1 capsule (300 mg total) by mouth 3 (three) times daily.  10/07/13  Yes Aleksei Plotnikov V, MD  losartan-hydrochlorothiazide (HYZAAR) 50-12.5 MG per tablet Take 1 tablet by mouth daily. 10/07/13  Yes Aleksei Plotnikov V, MD  methocarbamol (ROBAXIN) 500 MG tablet Take 1,000 mg by mouth at bedtime.  11/09/13  Yes Philmore Pali, NP  metoprolol tartrate (LOPRESSOR) 25 MG tablet Take 1 tablet (25 mg total) by mouth 2 (two) times daily. 06/10/13  Yes Daniel J Angiulli, PA-C  naproxen sodium (ANAPROX) 220 MG tablet Take 440 mg by mouth 2 (two) times daily as needed (for pain).   Yes Historical Provider, MD  ondansetron (ZOFRAN) 4 MG tablet Take 1 tablet (4 mg total) by mouth every 8 (eight) hours as needed for nausea or vomiting. 11/15/13  Yes Aleksei Plotnikov V, MD  pantoprazole (PROTONIX) 20 MG tablet Take 2 tablets (40 mg total) by mouth 2 (two) times daily. 09/27/13  Yes Aleksei Plotnikov V, MD  potassium chloride SA (KLOR-CON M20) 20 MEQ tablet Take 1 tablet (20 mEq total) by mouth 2 (two) times daily. 11/17/13  Yes Aleksei Plotnikov V, MD  predniSONE (DELTASONE) 10 MG tablet Take 10 mg by mouth daily.   Yes Historical Provider, MD  rizatriptan (MAXALT-MLT) 10 MG disintegrating tablet Take 1 tablet (10 mg total) by mouth as needed for migraine. May repeat in 2 hours if needed 11/09/13  Yes Philmore Pali, NP  simvastatin (ZOCOR) 20 MG tablet Take 1 tablet (20 mg total) by mouth at bedtime. 08/10/13  Yes Aleksei Plotnikov V, MD   BP 139/94  Pulse 82  Temp(Src) 98.7 F (37.1 C) (Oral)  Resp 20  SpO2 95% Physical Exam  Nursing note and vitals reviewed. Constitutional: He is oriented to person, place, and time. He appears well-developed and well-nourished. No distress.  HENT:  Head: Normocephalic and atraumatic.  Eyes: Pupils are equal, round, and reactive to light.  Neck: Normal range of motion.  Cardiovascular: Normal rate, regular rhythm, normal heart sounds and intact distal pulses.  Exam reveals no gallop and no friction rub.   No murmur heard. Pulmonary/Chest:  Effort normal and breath sounds normal. No respiratory distress. He has no wheezes. He has no rales. He exhibits no tenderness.  Abdominal: Soft. Bowel sounds are normal. He exhibits no distension  and no mass. There is no tenderness. There is no rebound and no guarding.  Musculoskeletal: Normal range of motion. He exhibits no edema and no tenderness.  Lymphadenopathy:    He has no cervical adenopathy.  Neurological: He is alert and oriented to person, place, and time. No cranial nerve deficit. Coordination normal.  Skin: Skin is warm and dry. He is not diaphoretic.  Scars over joint replacements and over lumbar spine show no signs of infection: no redness, swelling, tenderness, fluctuance, red streaking, or warmth.    ED Course  Procedures (including critical care time) Labs Review Labs Reviewed  CBC WITH DIFFERENTIAL - Abnormal; Notable for the following:    WBC 11.2 (*)    Monocytes Relative 16 (*)    Monocytes Absolute 1.7 (*)    All other components within normal limits  COMPREHENSIVE METABOLIC PANEL - Abnormal; Notable for the following:    GFR calc non Af Amer 89 (*)    All other components within normal limits  CULTURE, BLOOD (ROUTINE X 2)  CULTURE, BLOOD (ROUTINE X 2)  LIPASE, BLOOD  URINALYSIS, ROUTINE W REFLEX MICROSCOPIC  I-STAT TROPOININ, ED  I-STAT CG4 LACTIC ACID, ED    Imaging Review Dg Chest 2 View  11/18/2013   CLINICAL DATA:  Dizziness, headache, and hypertension  EXAM: CHEST  2 VIEW  COMPARISON:  PA and lateral chest x-ray of October 29, 2013  FINDINGS: There is chronic elevation of the right hemidiaphragm. There is parenchymal density in the left lateral costophrenic gutter which is more conspicuous today. The visualized portions of the cardiac silhouette appear mildly enlarged. The central pulmonary vascularity is more prominent today. The trachea is midline. 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  IMPRESSION: There is atelectasis versus developing  pneumonia at the left lung base. There is mildly increased prominence of the hilar structures especially on the right which may indicate central pulmonary vascular congestion.   Electronically Signed   By: David  Martinique   On: 11/18/2013 16:00     EKG Interpretation   Date/Time:  Thursday November 18 2013 14:07:09 EDT Ventricular Rate:  78 PR Interval:  178 QRS Duration: 86 QT Interval:  366 QTC Calculation: 417 R Axis:   82 Text Interpretation:  Sinus rhythm Probable left atrial enlargement  Anterior infarct, age indeterminate No significant change since last  tracing Confirmed by YAO  MD, DAVID ([id number]) on 11/18/2013 2:13:42 PM      MDM   68 yo Caucasian male here with migraine headache and fever. Please see history of present illness for details. On exam, patient in no acute distress, afebrile, vital signs stable. With recent history of admission for the very same with unknown origin a fever and leukocytosis at that time a thorough exam was performed. 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 There is no sign of redness or inflammation or swelling of any joints her back. The scar over his lumbar spine is clean dry with no signs of redness or warmth. Will treat patient's headache was a migraine cocktail but will also obtain CBC, CMP, CXR, UA, lactic acid, trop, EKG, blood cultures, and MRI brain and lumbar spine. Low suspicion of infection due to benign history and exam, but w/hx of immunosuppression and fever of unknown origin, will strive to find a source.   EKG shows normal sinus rhythm with possibly left atrial large. Chest x-ray shows possible central  pulmonary congestion and possible developing PNA in left lung base vs atelectasis. Based on benign exam and no hx of cough or any SOB, doubt PNA. White blood cell count 11.2 with no left shift. Initially patient was afebrile here but he did have  a fever at PCPs office this morning. However this was rechecked and patient now febrile. Given Tylenol.  Patient's care was handed over to Dr. Zola Button 4 PM. MRI images pending. If patient appears well with no worsening vitals and does not appear in distress and MRI is clear, patient stable for discharge home with followup to PCP.  Final diagnoses:  Fever, unspecified fever cause  Migraine without aura and without status migrainosus, not intractable    Pt was seen under the supervision of Dr. Darl Householder.    Sherian Maroon, MD 11/18/13 1652  Sherian Maroon, MD 11/18/13 [phone]

## 2013-11-19 ENCOUNTER — Encounter (HOSPITAL_COMMUNITY): Payer: Self-pay | Admitting: *Deleted

## 2013-11-19 ENCOUNTER — Inpatient Hospital Stay (HOSPITAL_COMMUNITY): Payer: Medicare Other

## 2013-11-19 DIAGNOSIS — E43 Unspecified severe protein-calorie malnutrition: Secondary | ICD-10-CM

## 2013-11-19 DIAGNOSIS — M069 Rheumatoid arthritis, unspecified: Secondary | ICD-10-CM

## 2013-11-19 DIAGNOSIS — M479 Spondylosis, unspecified: Secondary | ICD-10-CM

## 2013-11-19 DIAGNOSIS — J449 Chronic obstructive pulmonary disease, unspecified: Secondary | ICD-10-CM

## 2013-11-19 LAB — CSF CELL COUNT WITH DIFFERENTIAL
Eosinophils, CSF: 1 % (ref 0–1)
Lymphs, CSF: 39 % — ABNORMAL LOW (ref 40–80)
Monocyte-Macrophage-Spinal Fluid: 12 % — ABNORMAL LOW (ref 15–45)
RBC Count, CSF: 7100 /mm3 — ABNORMAL HIGH
Segmented Neutrophils-CSF: 48 % — ABNORMAL HIGH (ref 0–6)
Tube #: 1
WBC, CSF: 863 /mm3 (ref 0–5)

## 2013-11-19 LAB — GLUCOSE, CSF: Glucose, CSF: 18 mg/dL — CL (ref 43–76)

## 2013-11-19 LAB — GRAM STAIN

## 2013-11-19 LAB — MRSA PCR SCREENING: MRSA by PCR: NEGATIVE

## 2013-11-19 LAB — PROTEIN, CSF: Total  Protein, CSF: 247 mg/dL — ABNORMAL HIGH (ref 15–45)

## 2013-11-19 MED ORDER — DIVALPROEX SODIUM ER 500 MG PO TB24
500.0000 mg | ORAL_TABLET | Freq: Every day | ORAL | Status: DC
  Administered 2013-11-19 – 2013-11-24 (×6): 500 mg via ORAL
  Filled 2013-11-19 (×8): qty 1

## 2013-11-19 NOTE — ED Notes (Signed)
Lumbar puncture performed by Dr. Estanislado Spire. Lumbar puncture was unsuccessful.

## 2013-11-19 NOTE — Progress Notes (Signed)
Patient ID: [name], male   DOB: [birth year], 68 y.o.   MRN: [id number] Afeb, vss No new neuro issues Feels bettter now than upon admission. He is for LP by IR today. As long as he does well, will defer to Dr Joya Salm for need for any intervention.

## 2013-11-19 NOTE — Progress Notes (Signed)
Patient ID: [name], male   DOB: [birth year], 68 y.o.   MRN: [id number]         [name]    Date of Admission:  11/18/2013           Day 1 vancomycin        Day 1 meropenem  Principal Problem:   Infection of lumbar spine Active Problems:   Rheumatoid arthritis(714.0)   Protein-calorie malnutrition, severe   COPD (chronic obstructive pulmonary [name])   Degenerative arthritis of spine   Fever   . divalproex  500 mg Oral QHS  . FLUoxetine  20 mg Oral Daily  . fluticasone  2 spray Each Nare Daily  . gabapentin  300 mg Oral TID  . losartan  50 mg Oral Daily   And  . hydrochlorothiazide  12.5 mg Oral Daily  . meropenem (MERREM) IV  2 g Intravenous Q8H  . methocarbamol  1,000 mg Oral QHS  . metoprolol tartrate  25 mg Oral BID  . pantoprazole  40 mg Oral BID  . potassium chloride SA  20 mEq Oral BID  . predniSONE  10 mg Oral Daily  . simvastatin  20 mg Oral QHS  . vancomycin  500 mg Intravenous Q8H    Subjective: [name] is a 68 year old gentleman with rheumatoid arthritis and degenerative spine [name] who underwent lumbar fusion last November. He developed postoperative wound infection and was readmitted in December. He underwent incision and drainage and operative cultures revealed Pseudomonas. He received 6 weeks of IV cefepime completing therapy on January 24. He developed a diffuse rash of the tailor his therapy that was felt to be an allergic reaction to cefepime. He was supposed to transition to oral ciprofloxacin but never made it back to our clinic. He has had multiple readmissions since that time for altered mental status and headache. Fluid was aspirated from the lumbar region in March and was negative on Gram stain and culture. He describes increasing headache and low back pain for the past 2-1/2 months. He's had intermittent fever and chills. He has not been on antibiotics recently. He was readmitted last night with temperature  101.7. His MRI scan revealed "Enhancing debris in the dependent portion of the thecal sac and upper sacral subdural space consistent with postoperative infection/meningitis/subdural empyema. This has significantly progressed from February 2015." He states that he was supposed to change from Humira to Enbrel recently for his rheumatoid arthritis. He stopped his Humira several weeks ago but has not been able to start Enbrel yet.  Review of Systems: Constitutional: positive for chills, fevers and malaise, negative for sweats and weight loss Eyes: negative Ears, nose, mouth, throat, and face: negative Respiratory: negative Cardiovascular: negative Gastrointestinal: positive for constipation Genitourinary:negative for dysuria, hesitancy and urinary incontinence  Past Medical History  Diagnosis Date  . Hx of colonic polyps   . Diverticulosis   . Hypertension   . 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 CAD (coronary artery [name])     minimal coronary plaque in the LAD and right coronary system. 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 7-06. Last nuclear stress 11-17-06 fixed anterior/ inferior defect, no inducible ischemia, EF 81%  . Allergic rhinitis   . Hx of colonoscopy   . Dysrhythmia 01-24-12    past hx. A.Fib x1 episode-responded to med.  . Depression   .  GERD (gastroesophageal reflux [name])   . COPD (chronic obstructive pulmonary [name])     pt denies this hx on 05/24/2013  . Osteoarthritis   . RA (rheumatoid arthritis)   . Chronic back pain     "all over back" (06/16/2013)  . PONV (postoperative nausea and vomiting)   . High cholesterol   . Sleep apnea     "never dx'd; snores bad" (06/16/2013)  . Daily headache   . Falls frequently     "since 02/2013" (06/16/2013)  . Anxiety   . Narcotic abuse     per family  . Alcoholism /alcohol abuse     per family  . Dementia      History  Substance Use Topics  . Smoking status: Never Smoker   . Smokeless tobacco: Never Used  . Alcohol Use: No     Comment: 06/16/2013 "used to drink alot; was pretty bad about it; don't think he's had any alcohol in the past year"    Family History  Problem Relation Age of Onset  . Uterine cancer Mother     survivor  . Macular degeneration Mother   . Other Mother     ankle edema  . Lung cancer Mother   . Cancer Mother     ovarian, lung  . Coronary artery [name] Father   . Hypertension Father   . Prostate cancer Father   . Colon polyps Father   . Cancer Father     prostate  . Thyroid [name] Sister   . Heart attack Brother   . Hyperlipidemia Brother   . Other Brother     Schizophrenic  . Diabetes Neg Hx   . Colon cancer Neg Hx   . Esophageal cancer Neg Hx   . Rectal cancer Neg Hx   . Stomach cancer Neg Hx   . Coronary artery [name] Maternal Aunt   . Heart attack Maternal Aunt   . Rheum arthritis Sister   . Hemochromatosis Sister     Allergies  Allergen Reactions  . Morphine And Related Shortness Of Breath, Nausea And Vomiting, Swelling and Other (See Comments)    Agitation, tolerates dilaudid  . Penicillins Hives, Shortness Of Breath and Rash    REACTION: hives, breathing problems  . Cefixime     rash  . Percocet [Oxycodone-Acetaminophen] Anxiety    Takes OxyContin at home.    Objective: Temp:  [98 F (36.7 C)-100.7 F (38.2 C)] 98.1 F (36.7 C) (08/14 0800) Pulse Rate:  [60-92] 60 (08/14 0600) Resp:  [15-30] 24 (08/14 0600) BP: (104-165)/(58-94) 117/68 mmHg (08/14 0600) SpO2:  [89 %-100 %] 99 % (08/14 0600) Weight:  [151 lb 7.3 oz (68.7 kg)-154 lb 5.2 oz (70 kg)] 151 lb 7.3 oz (68.7 kg) (08/14 0300)  General: He is alert and appears uncomfortable due to pain. He is shivering. Skin: Some ecchymoses on arms. No acute rash Neck: Supple Lungs: Clear Cor: Regular S1 and S2 no murmurs Abdomen: Soft and nontender Neuro: Normal strength and  sensation in all extremities  Lab Results Lab Results  Component Value Date   WBC 11.2* 11/18/2013   HGB 14.1 11/18/2013   HCT 42.8 11/18/2013   MCV 97.7 11/18/2013   PLT 222 11/18/2013    Lab Results  Component Value Date   CREATININE 0.84 11/18/2013   BUN 20 11/18/2013   NA 137 11/18/2013   K 4.0 11/18/2013   CL 99 11/18/2013   CO2 26 11/18/2013    Lab Results  Component Value  Date   ALT 11 11/18/2013   AST 16 11/18/2013   ALKPHOS 62 11/18/2013   BILITOT 0.5 11/18/2013      Microbiology: Recent Results (from the past 240 hour(s))  CULTURE, BLOOD (ROUTINE X 2)     Status: None   Collection Time    11/18/13  3:32 PM      Result Value Ref Range Status   Specimen Description BLOOD ARM RIGHT   Final   Special Requests     Final   Value: BOTTLES DRAWN AEROBIC AND ANAEROBIC 5CC BLUE 3CCRED   Culture  Setup Time     Final   Value: 11/18/2013 20:38     Performed at [facility]   Culture     Final   Value:        BLOOD CULTURE RECEIVED NO GROWTH TO DATE CULTURE WILL BE HELD FOR 5 DAYS BEFORE ISSUING A FINAL NEGATIVE REPORT     Performed at [facility]   Report Status PENDING   Incomplete  CULTURE, BLOOD (ROUTINE X 2)     Status: None   Collection Time    11/18/13  4:30 PM      Result Value Ref Range Status   Specimen Description BLOOD ARM RIGHT   Final   Special Requests BOTTLES DRAWN AEROBIC AND ANAEROBIC 10CC   Final   Culture  Setup Time     Final   Value: 11/18/2013 20:38     Performed at [facility]   Culture     Final   Value:        BLOOD CULTURE RECEIVED NO GROWTH TO DATE CULTURE WILL BE HELD FOR 5 DAYS BEFORE ISSUING A FINAL NEGATIVE REPORT     Performed at [facility]   Report Status PENDING   Incomplete  MRSA PCR SCREENING     Status: None   Collection Time    11/19/13  3:22 AM      Result Value Ref Range Status   MRSA by PCR NEGATIVE  NEGATIVE Final   Comment:            The GeneXpert MRSA Assay (FDA     approved for NASAL  specimens     only), is one component of a     comprehensive MRSA colonization     surveillance program. It is not     intended to diagnose MRSA     infection nor to guide or     monitor treatment for     MRSA infections.    Studies/Results: Dg Chest 2 View  11/18/2013   CLINICAL DATA:  Dizziness, headache, and hypertension  EXAM: CHEST  2 VIEW  COMPARISON:  PA and lateral chest x-ray of October 29, 2013  FINDINGS: There is chronic elevation of the right hemidiaphragm. There is parenchymal density in the left lateral costophrenic gutter which is more conspicuous today. The visualized portions of the cardiac silhouette appear mildly enlarged. The central pulmonary vascularity is more prominent today. The trachea is midline. 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  IMPRESSION: There is atelectasis versus developing pneumonia at the left lung base. There is mildly increased prominence of the hilar structures especially on the right which may indicate central pulmonary vascular congestion.   Electronically Signed   By: David  Martin[place]   On: 11/18/2013 16:00   Mr Brain Wo Contrast  11/18/2013   CLINICAL DATA:  Fever with headache. History of migraines. History of hemochromatosis. History of immunosuppressive  drugs. History of rheumatoid arthritis  EXAM: MRI HEAD WITHOUT CONTRAST  TECHNIQUE: Multiplanar, multiecho pulse sequences of the brain and surrounding structures were obtained without intravenous contrast.  COMPARISON:  10/30/2013 CT scan. Also MRI scan 06/30/2012 and 09/18/2013.  FINDINGS: No evidence for acute infarction, hemorrhage, mass lesion, hydrocephalus, or extra-axial fluid. Mild to moderate cerebral and cerebellar atrophy. Mild subcortical and periventricular T2 and FLAIR hyperintensities, likely chronic microvascular ischemic change. Prominent perivascular spaces suggests chronic hypertension. Flow voids are maintained throughout the carotid, basilar, and vertebral arteries. There are  no areas of chronic hemorrhage. Pituitary, pineal, and cerebellar tonsils unremarkable. No upper cervical lesions. No pannus. Mild degenerative disc [name]. Visualized calvarium, skull base, and upper cervical osseous structures unremarkable. Scalp and extracranial soft tissues, orbits, sinuses, and mastoids show no acute process.  IMPRESSION: Chronic changes as described. No acute intracranial findings. Similar appearance to priors.   Electronically Signed   By: Rolla Flatten M.D.   On: 11/18/2013 20:16   Mr Lumbar Spine W Wo Contrast  11/18/2013   CLINICAL DATA:  Fever, and headache. Immunosuppression. Rheumatoid arthritis.  EXAM: MRI LUMBAR SPINE WITHOUT AND WITH CONTRAST  TECHNIQUE: Multiplanar and multiecho pulse sequences of the lumbar spine were obtained without and with intravenous contrast.  CONTRAST:  38m MULTIHANCE GADOBENATE DIMEGLUMINE 529 MG/ML IV SOLN  COMPARISON:  Lumbar spine radiographs 06/1913. MRI lumbar spine 05/30/2013.  FINDINGS: The patient status post L1 through S1 instrumentation for fusion. Interbody cages placed at L4-5. Severe multilevel disc space narrowing without vertebral body hyperintensity to suggest osteomyelitis. Slight T2 hyperintensity of the L3-4 disc space with minimal enhancement, slightly worse from for priors. Diskitis could have this appearance, although there is no endplate enhancement. Previously noted dorsal seroma is significantly improved.  In the dependent portion of the upper sacral canal/thecal sac, there is abnormal enhancement surrounding the nerve roots forming a meniscus within and around the thecal sac. Detail is obscured in the segments where there is pedicular screw instrumentation, but concern is raised for subarachnoid and subdural empyema, likely related to a combination of immunosuppression and recent surgery. 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 No distal conus or thoracic cord hyperintensity or visible lower thoracic fluid  collection. Unchanged minor disc pathology. No compression of the thecal sac in the L1 through L5 lumbar region with adequate posterior decompression.  IMPRESSION: Enhancing debris in the dependent portion of the thecal sac and upper sacral subdural space consistent with postoperative infection/meningitis/subdural empyema. This has significantly progressed from February 2015. Surgical consultation is warranted.  Slight enhancement of the L3-4 disc space has progressed from priors, equivocal for diskitis.  Findings discussed with responsible caregiver in the ED.   Electronically Signed   By: JRolla FlattenM.D.   On: 11/18/2013 21:06    Assessment: It appears that he has persistent or recurrent lumbosacral infection. I will ask interventional radiology if they can aspirate the fluid collection. I will continue empiric antibiotics for now.  Plan: 1. Continue antibiotics 2. Ask interventional radiology to aspirate fluid collection  JMichel Bickers MD RSaxon [facility] Centerfor ICorn[phone]   [phone] 11/19/2013, 11:11 AM

## 2013-11-19 NOTE — Progress Notes (Signed)
Patient ID: [name], male   DOB: 1945-04-26, 68 y.o.   MRN: [id number]    Request for sacral fluid aspiration per Dr Megan Salon  Dr Estanislado Pandy has reviewed films Unfortunately, NO safe window to perform this procedure   Called to Dr Megan Salon He is aware.

## 2013-11-19 NOTE — ED Notes (Signed)
MD at bedside. LP done by er resident

## 2013-11-19 NOTE — Progress Notes (Signed)
Contacted neuro group for proceeding w/ LP via fluoro order.

## 2013-11-19 NOTE — ED Provider Notes (Signed)
I saw and evaluated the patient, reviewed the resident's note and I agree with the findings and plan.   EKG Interpretation   Date/Time:  Thursday November 18 2013 14:07:09 EDT Ventricular Rate:  78 PR Interval:  178 QRS Duration: 86 QT Interval:  366 QTC Calculation: 417 R Axis:   82 Text Interpretation:  Sinus rhythm Probable left atrial enlargement  Anterior infarct, age indeterminate No significant change since last  tracing Confirmed by [place]  MD, DAVID ([id number]) on 11/18/2013 2:13:42 PM      [name] is a 68 y.o. male hx of diverticulosis, CAD, HTN, chronic migraines, previous spinal surgery complicated by abscess here with fever. Intermittent fever for several months. Came 2 weeks ago and had fever workup that showed neg cultures and some leukocytosis that resolved. Worsening headache and fever today. No neck stiffness. Sent to ED for eval. Low grade temp 100.7 in the ED. Had mild headache, nl neuro exam. No meningeal signs. Mild midline lumbar tenderness. Reviewed previous records. Previous admission didn't do echo or MRI spine. Will get MRI spine to r/o epidural abscess. No need for LP at this time. Signed out to Dr. Alvino Chapel and Dr. Lajuan Lines in the ED to f/u MRI results.    Wandra Arthurs, MD 11/19/13 [phone]

## 2013-11-19 NOTE — ED Notes (Signed)
Pt transferred to POD C room 29 to wait for EDP to perform Lumbar puncture. Eddie Dibbles RN given report on pt.

## 2013-11-19 NOTE — ED Provider Notes (Signed)
I saw and evaluated the patient, reviewed the resident's note and I agree with the findings and plan.   EKG Interpretation   Date/Time:  Thursday November 18 2013 14:07:09 EDT Ventricular Rate:  78 PR Interval:  178 QRS Duration: 86 QT Interval:  366 QTC Calculation: 417 R Axis:   82 Text Interpretation:  Sinus rhythm Probable left atrial enlargement  Anterior infarct, age indeterminate No significant change since last  tracing Confirmed by YAO  MD, DAVID ([id number]) on 11/18/2013 2:13:42 PM     Patient with fevers. Previous infected spinal surgery. MRI done and showed possible intrathecal infection. Unable to get successful lumbar puncture. Will admit to neurosurgery  Jasper Riling. Alvino Chapel, MD 11/19/13 1425

## 2013-11-20 DIAGNOSIS — B965 Pseudomonas (aeruginosa) (mallei) (pseudomallei) as the cause of diseases classified elsewhere: Secondary | ICD-10-CM

## 2013-11-20 LAB — VANCOMYCIN, TROUGH: Vancomycin Tr: 10.7 ug/mL (ref 10.0–20.0)

## 2013-11-20 MED ORDER — VANCOMYCIN HCL IN DEXTROSE 750-5 MG/150ML-% IV SOLN
750.0000 mg | Freq: Three times a day (TID) | INTRAVENOUS | Status: DC
  Administered 2013-11-21 – 2013-11-22 (×5): 750 mg via INTRAVENOUS
  Filled 2013-11-20 (×8): qty 150

## 2013-11-20 MED ORDER — HYDROMORPHONE HCL PF 1 MG/ML IJ SOLN
1.0000 mg | INTRAMUSCULAR | Status: DC | PRN
  Administered 2013-11-20 – 2013-11-25 (×15): 1 mg via INTRAVENOUS
  Filled 2013-11-20 (×15): qty 1

## 2013-11-20 MED ORDER — CIPROFLOXACIN HCL 750 MG PO TABS
750.0000 mg | ORAL_TABLET | Freq: Two times a day (BID) | ORAL | Status: DC
  Administered 2013-11-20 – 2013-11-25 (×10): 750 mg via ORAL
  Filled 2013-11-20 (×12): qty 1

## 2013-11-20 NOTE — Progress Notes (Signed)
ANTIBIOTIC CONSULT NOTE - INITIAL  Pharmacy Consult for vancomycin and meropenem Indication: Spinal infection, possible meningitis  Allergies  Allergen Reactions  . Morphine And Related Shortness Of Breath, Nausea And Vomiting, Swelling and Other (See Comments)    Agitation, tolerates dilaudid  . Penicillins Hives, Shortness Of Breath and Rash    REACTION: hives, breathing problems  . Cefepime   . Percocet [Oxycodone-Acetaminophen] Anxiety    Takes OxyContin at home.    Patient Measurements: Height: 5' 6.5" (168.9 cm) Weight: 151 lb 7.3 oz (68.7 kg) IBW/kg (Calculated) : 64.95   Vital Signs: Temp: 98.2 F (36.8 C) (08/15 1611) Temp src: Oral (08/15 1611) BP: 128/63 mmHg (08/15 1611) Pulse Rate: 74 (08/15 1611) Intake/Output from previous day: 08/14 0701 - 08/15 0700 In: 2457.5 [P.O.:720; I.V.:1337.5; IV Piggyback:400] Out: 925 [Urine:925] Intake/Output from this shift: Total I/O In: 1140 [P.O.:390; I.V.:450; IV Piggyback:300] Out: 900 [Urine:900]  Labs:  Recent Labs  11/18/13 1408  WBC 11.2*  HGB 14.1  PLT 222  CREATININE 0.84   Estimated Creatinine Clearance: 79.5 ml/min (by C-G formula based on Cr of 0.84).  Recent Labs  11/20/13 1610  [place] 10.7     Microbiology: Recent Results (from the past 720 hour(s))  CULTURE, BLOOD (ROUTINE X 2)     Status: None   Collection Time    10/29/13  8:30 PM      Result Value Ref Range Status   Specimen Description BLOOD ARM RIGHT   Final   Special Requests BOTTLES DRAWN AEROBIC AND ANAEROBIC [facility]   Final   Culture  Setup Time     Final   Value: 10/30/2013 05:11     Performed at [facility]   Culture     Final   Value: NO GROWTH 5 DAYS     Performed at [facility]   Report Status 11/05/2013 FINAL   Final  CULTURE, BLOOD (ROUTINE X 2)     Status: None   Collection Time    10/29/13  8:37 PM      Result Value Ref Range Status   Specimen Description BLOOD ARM LEFT   Final   Special  Requests BOTTLES DRAWN AEROBIC AND ANAEROBIC 10CC   Final   Culture  Setup Time     Final   Value: 10/30/2013 05:11     Performed at [facility]   Culture     Final   Value: NO GROWTH 5 DAYS     Performed at [facility]   Report Status 11/05/2013 FINAL   Final  URINE CULTURE     Status: None   Collection Time    10/30/13  4:33 PM      Result Value Ref Range Status   Specimen Description URINE, CLEAN CATCH   Final   Special Requests NONE   Final   Culture  Setup Time     Final   Value: 10/30/2013 21:12     Performed at [place]     Final   Value: 3,000 COLONIES/ML     Performed at [facility]   Culture     Final   Value: INSIGNIFICANT GROWTH     Performed at [facility]   Report Status 10/31/2013 FINAL   Final  CULTURE, BLOOD (ROUTINE X 2)     Status: None   Collection Time    11/18/13  3:32 PM      Result Value Ref Range  Status   Specimen Description BLOOD ARM RIGHT   Final   Special Requests     Final   Value: BOTTLES DRAWN AEROBIC AND ANAEROBIC 5CC BLUE 3CCRED   Culture  Setup Time     Final   Value: 11/18/2013 20:38     Performed at [facility]   Culture     Final   Value:        BLOOD CULTURE RECEIVED NO GROWTH TO DATE CULTURE WILL BE HELD FOR 5 DAYS BEFORE ISSUING A FINAL NEGATIVE REPORT     Performed at [facility]   Report Status PENDING   Incomplete  CULTURE, BLOOD (ROUTINE X 2)     Status: None   Collection Time    11/18/13  4:30 PM      Result Value Ref Range Status   Specimen Description BLOOD ARM RIGHT   Final   Special Requests BOTTLES DRAWN AEROBIC AND ANAEROBIC 10CC   Final   Culture  Setup Time     Final   Value: 11/18/2013 20:38     Performed at [facility]   Culture     Final   Value:        BLOOD CULTURE RECEIVED NO GROWTH TO DATE CULTURE WILL BE HELD FOR 5 DAYS BEFORE ISSUING A FINAL NEGATIVE REPORT     Performed at [facility]   Report Status  PENDING   Incomplete  MRSA PCR SCREENING     Status: None   Collection Time    11/19/13  3:22 AM      Result Value Ref Range Status   MRSA by PCR NEGATIVE  NEGATIVE Final   Comment:            The GeneXpert MRSA Assay (FDA     approved for NASAL specimens     only), is one component of a     comprehensive MRSA colonization     surveillance program. It is not     intended to diagnose MRSA     infection nor to guide or     monitor treatment for     MRSA infections.  CSF CULTURE     Status: None   Collection Time    11/19/13  1:55 PM      Result Value Ref Range Status   Specimen Description CSF   Final   Special Requests NONE   Final   Gram Stain     Final   Value: CYTOSPIN WBC PRESENT,BOTH PMN AND MONONUCLEAR     NO ORGANISMS SEEN     Performed at [facility]     Performed at [facility]   Culture     Final   Value: NO GROWTH 1 DAY     Performed at [facility]   Report Status PENDING   Incomplete  GRAM STAIN     Status: None   Collection Time    11/19/13  1:55 PM      Result Value Ref Range Status   Specimen Description CSF   Final   Special Requests NONE   Final   Gram Stain     Final   Value: CYTOSPIN PREP     WBC PRESENT,BOTH PMN AND MONONUCLEAR     NO ORGANISMS SEEN   Report Status 11/19/2013 FINAL   Final    Medical History: Past Medical History  Diagnosis Date  . Hx of colonic polyps   . Diverticulosis   .  Hypertension   . 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 CAD (coronary artery disease)     minimal coronary plaque in the LAD and right coronary system. 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 7-06. Last nuclear stress 11-17-06 fixed anterior/ inferior defect, no inducible ischemia, EF 81%  . Allergic rhinitis   . Hx of colonoscopy   . Dysrhythmia 01-24-12    past hx. A.Fib x1 episode-responded to med.  . Depression    . GERD (gastroesophageal reflux disease)   . COPD (chronic obstructive pulmonary disease)     pt denies this hx on 05/24/2013  . Osteoarthritis   . RA (rheumatoid arthritis)   . Chronic back pain     "all over back" (06/16/2013)  . PONV (postoperative nausea and vomiting)   . High cholesterol   . Sleep apnea     "never dx'd; snores bad" (06/16/2013)  . Daily headache   . Falls frequently     "since 02/2013" (06/16/2013)  . Anxiety   . Narcotic abuse     per family  . Alcoholism /alcohol abuse     per family  . Dementia     Medications:  See EMR  Assessment: 68 year old male with history of back surgery and post-op infection, now with headache and fever, fluid collection on MRI. Neurosurgery has spoken with ID and now received orders to start empiric vancomycin and meropenem. Patient has several allergies but appears to have tolerated primaxin and cefepime in the past so should not have problems with meropenem. Trough came back low today so will adjust vanc.   Goal of Therapy:  Vancomycin trough level 15-20 mcg/ml  Plan:   Increase vanc to 712m IV q8 F/u with another trough soon

## 2013-11-20 NOTE — Progress Notes (Signed)
[facility] for Infectious Disease  Date of Admission:  11/18/2013  Antibiotics: Vancomycin meropenem  Subjective: No significant pain  Objective: Temp:  [97.5 F (36.4 C)-98.9 F (37.2 C)] 98.1 F (36.7 C) (08/15 1221) Pulse Rate:  [64-79] 69 (08/15 0927) Resp:  [17-28] 28 (08/15 0352) BP: (106-137)/(51-72) 106/63 mmHg (08/15 0927) SpO2:  [97 %-100 %] 97 % (08/15 0352)  General: awake, alert, nad Skin: no rashes Lungs: CTA B Cor: RRR Abdomen: Soft, nt, nd Back: no tenderness, no wound, no erythema  Lab Results Lab Results  Component Value Date   WBC 11.2* 11/18/2013   HGB 14.1 11/18/2013   HCT 42.8 11/18/2013   MCV 97.7 11/18/2013   PLT 222 11/18/2013    Lab Results  Component Value Date   CREATININE 0.84 11/18/2013   BUN 20 11/18/2013   NA 137 11/18/2013   K 4.0 11/18/2013   CL 99 11/18/2013   CO2 26 11/18/2013    Lab Results  Component Value Date   ALT 11 11/18/2013   AST 16 11/18/2013   ALKPHOS 62 11/18/2013   BILITOT 0.5 11/18/2013      Microbiology: Recent Results (from the past 240 hour(s))  CULTURE, BLOOD (ROUTINE X 2)     Status: None   Collection Time    11/18/13  3:32 PM      Result Value Ref Range Status   Specimen Description BLOOD ARM RIGHT   Final   Special Requests     Final   Value: BOTTLES DRAWN AEROBIC AND ANAEROBIC 5CC BLUE 3CCRED   Culture  Setup Time     Final   Value: 11/18/2013 20:38     Performed at [facility]   Culture     Final   Value:        BLOOD CULTURE RECEIVED NO GROWTH TO DATE CULTURE WILL BE HELD FOR 5 DAYS BEFORE ISSUING A FINAL NEGATIVE REPORT     Performed at [facility]   Report Status PENDING   Incomplete  CULTURE, BLOOD (ROUTINE X 2)     Status: None   Collection Time    11/18/13  4:30 PM      Result Value Ref Range Status   Specimen Description BLOOD ARM RIGHT   Final   Special Requests BOTTLES DRAWN AEROBIC AND ANAEROBIC 10CC   Final   Culture  Setup Time     Final   Value: 11/18/2013  20:38     Performed at [facility]   Culture     Final   Value:        BLOOD CULTURE RECEIVED NO GROWTH TO DATE CULTURE WILL BE HELD FOR 5 DAYS BEFORE ISSUING A FINAL NEGATIVE REPORT     Performed at [facility]   Report Status PENDING   Incomplete  MRSA PCR SCREENING     Status: None   Collection Time    11/19/13  3:22 AM      Result Value Ref Range Status   MRSA by PCR NEGATIVE  NEGATIVE Final   Comment:            The GeneXpert MRSA Assay (FDA     approved for NASAL specimens     only), is one component of a     comprehensive MRSA colonization     surveillance program. It is not     intended to diagnose MRSA     infection nor to guide or  monitor treatment for     MRSA infections.  CSF CULTURE     Status: None   Collection Time    11/19/13  1:55 PM      Result Value Ref Range Status   Specimen Description CSF   Final   Special Requests NONE   Final   Gram Stain     Final   Value: CYTOSPIN WBC PRESENT,BOTH PMN AND MONONUCLEAR     NO ORGANISMS SEEN     Performed at [facility]     Performed at [facility]   Culture PENDING   Incomplete   Report Status PENDING   Incomplete  GRAM STAIN     Status: None   Collection Time    11/19/13  1:55 PM      Result Value Ref Range Status   Specimen Description CSF   Final   Special Requests NONE   Final   Gram Stain     Final   Value: CYTOSPIN PREP     WBC PRESENT,BOTH PMN AND MONONUCLEAR     NO ORGANISMS SEEN   Report Status 11/19/2013 FINAL   Final    Studies/Results: Dg Chest 2 View  11/18/2013   CLINICAL DATA:  Dizziness, headache, and hypertension  EXAM: CHEST  2 VIEW  COMPARISON:  PA and lateral chest x-ray of October 29, 2013  FINDINGS: There is chronic elevation of the right hemidiaphragm. There is parenchymal density in the left lateral costophrenic gutter which is more conspicuous today. The visualized portions of the cardiac silhouette appear mildly enlarged. The central pulmonary  vascularity is more prominent today. The trachea is midline. 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  IMPRESSION: There is atelectasis versus developing pneumonia at the left lung base. There is mildly increased prominence of the hilar structures especially on the right which may indicate central pulmonary vascular congestion.   Electronically Signed   By: David  Martinique   On: 11/18/2013 16:00   Mr Brain Wo Contrast  11/18/2013   CLINICAL DATA:  Fever with headache. History of migraines. History of hemochromatosis. History of immunosuppressive drugs. History of rheumatoid arthritis  EXAM: MRI HEAD WITHOUT CONTRAST  TECHNIQUE: Multiplanar, multiecho pulse sequences of the brain and surrounding structures were obtained without intravenous contrast.  COMPARISON:  10/30/2013 CT scan. Also MRI scan 06/30/2012 and 09/18/2013.  FINDINGS: No evidence for acute infarction, hemorrhage, mass lesion, hydrocephalus, or extra-axial fluid. Mild to moderate cerebral and cerebellar atrophy. Mild subcortical and periventricular T2 and FLAIR hyperintensities, likely chronic microvascular ischemic change. Prominent perivascular spaces suggests chronic hypertension. Flow voids are maintained throughout the carotid, basilar, and vertebral arteries. There are no areas of chronic hemorrhage. Pituitary, pineal, and cerebellar tonsils unremarkable. No upper cervical lesions. No pannus. Mild degenerative disc disease. Visualized calvarium, skull base, and upper cervical osseous structures unremarkable. Scalp and extracranial soft tissues, orbits, sinuses, and mastoids show no acute process.  IMPRESSION: Chronic changes as described. No acute intracranial findings. Similar appearance to priors.   Electronically Signed   By: Rolla Flatten M.D.   On: 11/18/2013 20:16   Mr Lumbar Spine W Wo Contrast  11/18/2013   CLINICAL DATA:  Fever, and headache. Immunosuppression. Rheumatoid arthritis.  EXAM: MRI LUMBAR SPINE WITHOUT AND WITH  CONTRAST  TECHNIQUE: Multiplanar and multiecho pulse sequences of the lumbar spine were obtained without and with intravenous contrast.  CONTRAST:  33m MULTIHANCE GADOBENATE DIMEGLUMINE 529 MG/ML IV SOLN  COMPARISON:  Lumbar spine radiographs 06/1913. MRI lumbar spine 05/30/2013.  FINDINGS: The patient status post L1 through S1 instrumentation for fusion. Interbody cages placed at L4-5. Severe multilevel disc space narrowing without vertebral body hyperintensity to suggest osteomyelitis. Slight T2 hyperintensity of the L3-4 disc space with minimal enhancement, slightly worse from for priors. Diskitis could have this appearance, although there is no endplate enhancement. Previously noted dorsal seroma is significantly improved.  In the dependent portion of the upper sacral canal/thecal sac, there is abnormal enhancement surrounding the nerve roots forming a meniscus within and around the thecal sac. Detail is obscured in the segments where there is pedicular screw instrumentation, but concern is raised for subarachnoid and subdural empyema, likely related to a combination of immunosuppression and recent surgery. 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 No distal conus or thoracic cord hyperintensity or visible lower thoracic fluid collection. Unchanged minor disc pathology. No compression of the thecal sac in the L1 through L5 lumbar region with adequate posterior decompression.  IMPRESSION: Enhancing debris in the dependent portion of the thecal sac and upper sacral subdural space consistent with postoperative infection/meningitis/subdural empyema. This has significantly progressed from February 2015. Surgical consultation is warranted.  Slight enhancement of the L3-4 disc space has progressed from priors, equivocal for diskitis.  Findings discussed with responsible caregiver in the ED.   Electronically Signed   By: Rolla Flatten M.D.   On: 11/18/2013 21:06   Dg Fluoro Guide Lumbar  Puncture  11/19/2013   CLINICAL DATA:  Meningitis  EXAM: DIAGNOSTIC LUMBAR PUNCTURE UNDER FLUOROSCOPIC GUIDANCE  FLUOROSCOPY TIME:  18 seconds  PROCEDURE: Informed consent was obtained from the patient prior to the procedure, including potential complications of headache, allergy, and pain. With the patient prone, the lower back was prepped with Betadine. 1% Lidocaine was used for local anesthesia. Lumbar puncture was performed at the L3-4 level using a 20 gauge needle with return of blood-tinged CSF initially with subsequent clearing. 62m of CSF were obtained for laboratory studies. The patient tolerated the procedure well and there were no apparent complications.  IMPRESSION: Successful fluoroscopic guided lumbar puncture.   Electronically Signed   By: HKathreen Devoid  On: 11/19/2013 14:49    Assessment/Plan: 1)  Possible subdural empyema - previous culture with Pseudomonas and sensitive to cipro.  Did not tolerate cefepime.  Certainly other organisms possible such as Staph. I would continue with broad antibiotics though oral cipro will be adequate and also continue with vancomycin and if no particular culture grows from LP, would continue with vanco and cipro for at least 3-4 and possible 6 weeks and repeat MRI for resolution.  I will check ESR and CRP as well.  I will wait for now to place picc line in case something grows.   Will follow up on Monday  CScharlene Gloss MBrahamfor Infectious Disease [email or web address] [phone]   [phone] 11/20/2013, 1:36 PM

## 2013-11-20 NOTE — Progress Notes (Signed)
Subjective: Patient reports stable  Objective: Vital signs in last 24 hours: Temp:  [97.5 F (36.4 C)-101.5 F (38.6 C)] 98 F (36.7 C) (08/15 0700) Pulse Rate:  [64-81] 69 (08/15 0927) Resp:  [17-28] 28 (08/15 0352) BP: (106-156)/(51-79) 106/63 mmHg (08/15 0927) SpO2:  [94 %-100 %] 97 % (08/15 0352)  Intake/Output from previous day: 08/14 0701 - 08/15 0700 In: 2457.5 [P.O.:720; I.V.:1337.5; IV Piggyback:400] Out: 925 [Urine:925] Intake/Output this shift: Total I/O In: 550 [P.O.:150; I.V.:100; IV Piggyback:300] Out: -   Physical Exam: Full strength both lower extremities.  Lab Results:  Recent Labs  11/18/13 1408  WBC 11.2*  HGB 14.1  HCT 42.8  PLT 222   BMET  Recent Labs  11/18/13 1408  NA 137  K 4.0  CL 99  CO2 26  GLUCOSE 90  BUN 20  CREATININE 0.84  CALCIUM 9.3    Studies/Results: Dg Chest 2 View  11/18/2013   CLINICAL DATA:  Dizziness, headache, and hypertension  EXAM: CHEST  2 VIEW  COMPARISON:  PA and lateral chest x-ray of October 29, 2013  FINDINGS: There is chronic elevation of the right hemidiaphragm. There is parenchymal density in the left lateral costophrenic gutter which is more conspicuous today. The visualized portions of the cardiac silhouette appear mildly enlarged. The central pulmonary vascularity is more prominent today. The trachea is midline. 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  IMPRESSION: There is atelectasis versus developing pneumonia at the left lung base. There is mildly increased prominence of the hilar structures especially on the right which may indicate central pulmonary vascular congestion.   Electronically Signed   By: David  Martinique   On: 11/18/2013 16:00   Mr Brain Wo Contrast  11/18/2013   CLINICAL DATA:  Fever with headache. History of migraines. History of hemochromatosis. History of immunosuppressive drugs. History of rheumatoid arthritis  EXAM: MRI HEAD WITHOUT CONTRAST  TECHNIQUE: Multiplanar, multiecho  pulse sequences of the brain and surrounding structures were obtained without intravenous contrast.  COMPARISON:  10/30/2013 CT scan. Also MRI scan 06/30/2012 and 09/18/2013.  FINDINGS: No evidence for acute infarction, hemorrhage, mass lesion, hydrocephalus, or extra-axial fluid. Mild to moderate cerebral and cerebellar atrophy. Mild subcortical and periventricular T2 and FLAIR hyperintensities, likely chronic microvascular ischemic change. Prominent perivascular spaces suggests chronic hypertension. Flow voids are maintained throughout the carotid, basilar, and vertebral arteries. There are no areas of chronic hemorrhage. Pituitary, pineal, and cerebellar tonsils unremarkable. No upper cervical lesions. No pannus. Mild degenerative disc disease. Visualized calvarium, skull base, and upper cervical osseous structures unremarkable. Scalp and extracranial soft tissues, orbits, sinuses, and mastoids show no acute process.  IMPRESSION: Chronic changes as described. No acute intracranial findings. Similar appearance to priors.   Electronically Signed   By: Rolla Flatten M.D.   On: 11/18/2013 20:16   Mr Lumbar Spine W Wo Contrast  11/18/2013   CLINICAL DATA:  Fever, and headache. Immunosuppression. Rheumatoid arthritis.  EXAM: MRI LUMBAR SPINE WITHOUT AND WITH CONTRAST  TECHNIQUE: Multiplanar and multiecho pulse sequences of the lumbar spine were obtained without and with intravenous contrast.  CONTRAST:  6m MULTIHANCE GADOBENATE DIMEGLUMINE 529 MG/ML IV SOLN  COMPARISON:  Lumbar spine radiographs 06/1913. MRI lumbar spine 05/30/2013.  FINDINGS: The patient status post L1 through S1 instrumentation for fusion. Interbody cages placed at L4-5. Severe multilevel disc space narrowing without vertebral body hyperintensity to suggest osteomyelitis. Slight T2 hyperintensity of the L3-4 disc space with minimal enhancement, slightly worse from for priors. Diskitis could have this  appearance, although there is no endplate  enhancement. Previously noted dorsal seroma is significantly improved.  In the dependent portion of the upper sacral canal/thecal sac, there is abnormal enhancement surrounding the nerve roots forming a meniscus within and around the thecal sac. Detail is obscured in the segments where there is pedicular screw instrumentation, but concern is raised for subarachnoid and subdural empyema, likely related to a combination of immunosuppression and recent surgery. 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 No distal conus or thoracic cord hyperintensity or visible lower thoracic fluid collection. Unchanged minor disc pathology. No compression of the thecal sac in the L1 through L5 lumbar region with adequate posterior decompression.  IMPRESSION: Enhancing debris in the dependent portion of the thecal sac and upper sacral subdural space consistent with postoperative infection/meningitis/subdural empyema. This has significantly progressed from February 2015. Surgical consultation is warranted.  Slight enhancement of the L3-4 disc space has progressed from priors, equivocal for diskitis.  Findings discussed with responsible caregiver in the ED.   Electronically Signed   By: Rolla Flatten M.D.   On: 11/18/2013 21:06   Dg Fluoro Guide Lumbar Puncture  11/19/2013   CLINICAL DATA:  Meningitis  EXAM: DIAGNOSTIC LUMBAR PUNCTURE UNDER FLUOROSCOPIC GUIDANCE  FLUOROSCOPY TIME:  18 seconds  PROCEDURE: Informed consent was obtained from the patient prior to the procedure, including potential complications of headache, allergy, and pain. With the patient prone, the lower back was prepped with Betadine. 1% Lidocaine was used for local anesthesia. Lumbar puncture was performed at the L3-4 level using a 20 gauge needle with return of blood-tinged CSF initially with subsequent clearing. 55m of CSF were obtained for laboratory studies. The patient tolerated the procedure well and there were no apparent complications.   IMPRESSION: Successful fluoroscopic guided lumbar puncture.   Electronically Signed   By: HKathreen Devoid  On: 11/19/2013 14:49    Assessment/Plan: CSF results without organisms, but suggestive of infection with high WBC.  Awaiting cultures.  Dr. BJoya Salmto make definitive treatment plans Monday.    LOS: 2 days    SPeggyann Shoals MD 11/20/2013, 11:22 AM

## 2013-11-21 MED ORDER — SENNOSIDES-DOCUSATE SODIUM 8.6-50 MG PO TABS
1.0000 | ORAL_TABLET | Freq: Two times a day (BID) | ORAL | Status: DC
  Administered 2013-11-21 – 2013-11-25 (×9): 1 via ORAL
  Filled 2013-11-21 (×10): qty 1

## 2013-11-21 MED ORDER — SODIUM CHLORIDE 0.9 % IJ SOLN: 10.0000 mL | INTRAMUSCULAR | Status: DC | PRN

## 2013-11-21 MED ORDER — SODIUM CHLORIDE 0.9 % IJ SOLN
10.0000 mL | Freq: Two times a day (BID) | INTRAMUSCULAR | Status: DC
  Administered 2013-11-21 – 2013-11-24 (×4): 10 mL
  Filled 2013-11-21: qty 10

## 2013-11-21 NOTE — Progress Notes (Signed)
Peripherally Inserted Central Catheter/Midline Placement  The IV Nurse has discussed with the patient and/or persons authorized to consent for the patient, the purpose of this procedure and the potential benefits and risks involved with this procedure.  The benefits include less needle sticks, lab draws from the catheter and patient may be discharged home with the catheter.  Risks include, but not limited to, infection, bleeding, blood clot (thrombus formation), and puncture of an artery; nerve damage and irregular heat beat.  Alternatives to this procedure were also discussed.  PICC/Midline Placement Documentation  PICC / Midline Single Lumen 90/12/22 PICC Right Basilic 41 cm 1 cm (Active)  Indication for Insertion or Continuance of Line Home intravenous therapies (PICC only) 11/21/2013  4:24 PM  Exposed Catheter (cm) 1 cm 11/21/2013  4:24 PM  Site Assessment Clean;Dry;Intact 11/21/2013  4:24 PM  Line Status Flushed;Saline locked;Blood return noted 11/21/2013  4:24 PM  Dressing Type Transparent 11/21/2013  4:24 PM  Dressing Status Clean;Dry;Intact;Antimicrobial disc in place 11/21/2013  4:24 PM  Line Care Connections checked and tightened 11/21/2013  4:24 PM  Line Adjustment (NICU/IV Team Only) No 11/21/2013  4:24 PM  Dressing Intervention New dressing 11/21/2013  4:24 PM  Dressing Change Due 11/28/13 11/21/2013  4:24 PM       Rolena Infante 11/21/2013, 4:25 PM

## 2013-11-21 NOTE — Progress Notes (Signed)
I called patients daughter per her request for update.  No answer, no voicemail.  Will try again tomorrow.    Scharlene Gloss, MD

## 2013-11-21 NOTE — Progress Notes (Signed)
Patient ID: [name], male   DOB: Jan 30, 1946, 68 y.o.   MRN: [id number] Vital signs are stable, Motor function appears intact. CSF glucose is 18. White cells as high as 841 with 7000 red cells Protein is elevated to twice normal. Cultures are pending. Patient reports that he had had fevers as high as 102.7 prior to admission. Continue to observe on antibiotics.

## 2013-11-22 ENCOUNTER — Ambulatory Visit: Payer: Medicare Other | Admitting: Physical Therapy

## 2013-11-22 LAB — BASIC METABOLIC PANEL
Anion gap: 10 (ref 5–15)
BUN: 12 mg/dL (ref 6–23)
CO2: 28 mEq/L (ref 19–32)
Calcium: 8.9 mg/dL (ref 8.4–10.5)
Chloride: 94 mEq/L — ABNORMAL LOW (ref 96–112)
Creatinine, Ser: 0.58 mg/dL (ref 0.50–1.35)
GFR calc Af Amer: >90 mL/min (ref >90)
GFR calc non Af Amer: >90 mL/min (ref >90)
Glucose, Bld: 139 mg/dL — ABNORMAL HIGH (ref 70–99)
Potassium: 4.3 mEq/L (ref 3.7–5.3)
Sodium: 132 mEq/L — ABNORMAL LOW (ref 137–147)

## 2013-11-22 LAB — PATHOLOGIST SMEAR REVIEW

## 2013-11-22 LAB — VANCOMYCIN, TROUGH: Vancomycin Tr: 19.3 ug/mL (ref 10.0–20.0)

## 2013-11-22 LAB — SEDIMENTATION RATE: Sed Rate: 12 mm/hr (ref 0–16)

## 2013-11-22 LAB — C-REACTIVE PROTEIN: CRP: 0.7 mg/dL — ABNORMAL HIGH (ref <0.60)

## 2013-11-22 MED ORDER — SODIUM CHLORIDE 0.9 % IV SOLN
1250.0000 mg | Freq: Two times a day (BID) | INTRAVENOUS | Status: DC
  Administered 2013-11-22 – 2013-11-25 (×6): 1250 mg via INTRAVENOUS
  Filled 2013-11-22 (×8): qty 1250

## 2013-11-22 NOTE — Progress Notes (Signed)
[place] for Infectious Disease  Date of Admission:  11/18/2013  Antibiotics: Vancomycin cipro  Subjective: No significant pain, some nausea  Objective: Temp:  [97.4 F (36.3 C)-98.9 F (37.2 C)] 98.6 F (37 C) (08/17 0700) Pulse Rate:  [63-80] 63 (08/17 0800) Resp:  [12-27] 19 (08/17 0800) BP: (103-126)/(54-74) 126/74 mmHg (08/17 0800) SpO2:  [91 %-99 %] 99 % (08/17 0800)  General: awake, alert, nad Skin: no rashes Lungs: CTA B Cor: RRR Abdomen: Soft, nt, nd Back: no tenderness, no wound, no erythema  Lab Results Lab Results  Component Value Date   WBC 11.2* 11/18/2013   HGB 14.1 11/18/2013   HCT 42.8 11/18/2013   MCV 97.7 11/18/2013   PLT 222 11/18/2013    Lab Results  Component Value Date   CREATININE 0.84 11/18/2013   BUN 20 11/18/2013   NA 137 11/18/2013   K 4.0 11/18/2013   CL 99 11/18/2013   CO2 26 11/18/2013    Lab Results  Component Value Date   ALT 11 11/18/2013   AST 16 11/18/2013   ALKPHOS 62 11/18/2013   BILITOT 0.5 11/18/2013      Microbiology: Recent Results (from the past 240 hour(s))  CULTURE, BLOOD (ROUTINE X 2)     Status: None   Collection Time    11/18/13  3:32 PM      Result Value Ref Range Status   Specimen Description BLOOD ARM RIGHT   Final   Special Requests     Final   Value: BOTTLES DRAWN AEROBIC AND ANAEROBIC 5CC BLUE 3CCRED   Culture  Setup Time     Final   Value: 11/18/2013 20:38     Performed at [facility]   Culture     Final   Value:        BLOOD CULTURE RECEIVED NO GROWTH TO DATE CULTURE WILL BE HELD FOR 5 DAYS BEFORE ISSUING A FINAL NEGATIVE REPORT     Performed at [facility]   Report Status PENDING   Incomplete  CULTURE, BLOOD (ROUTINE X 2)     Status: None   Collection Time    11/18/13  4:30 PM      Result Value Ref Range Status   Specimen Description BLOOD ARM RIGHT   Final   Special Requests BOTTLES DRAWN AEROBIC AND ANAEROBIC 10CC   Final   Culture  Setup Time     Final   Value:  11/18/2013 20:38     Performed at [facility]   Culture     Final   Value:        BLOOD CULTURE RECEIVED NO GROWTH TO DATE CULTURE WILL BE HELD FOR 5 DAYS BEFORE ISSUING A FINAL NEGATIVE REPORT     Performed at [facility]   Report Status PENDING   Incomplete  MRSA PCR SCREENING     Status: None   Collection Time    11/19/13  3:22 AM      Result Value Ref Range Status   MRSA by PCR NEGATIVE  NEGATIVE Final   Comment:            The GeneXpert MRSA Assay (FDA     approved for NASAL specimens     only), is one component of a     comprehensive MRSA colonization     surveillance program. It is not     intended to diagnose MRSA     infection nor to guide or  monitor treatment for     MRSA infections.  CSF CULTURE     Status: None   Collection Time    11/19/13  1:55 PM      Result Value Ref Range Status   Specimen Description CSF   Final   Special Requests NONE   Final   Gram Stain     Final   Value: CYTOSPIN WBC PRESENT,BOTH PMN AND MONONUCLEAR     NO ORGANISMS SEEN     Performed at [facility]     Performed at [facility]   Culture     Final   Value: NO GROWTH 3 DAYS     Performed at [facility]   Report Status PENDING   Incomplete  GRAM STAIN     Status: None   Collection Time    11/19/13  1:55 PM      Result Value Ref Range Status   Specimen Description CSF   Final   Special Requests NONE   Final   Gram Stain     Final   Value: CYTOSPIN PREP     WBC PRESENT,BOTH PMN AND MONONUCLEAR     NO ORGANISMS SEEN   Report Status 11/19/2013 FINAL   Final    Studies/Results: No results found.  Assessment/Plan: 1)  Possible subdural empyema - previous culture with Pseudomonas and sensitive to cipro.  Did not tolerate cefepime.  Certainly other organisms possible such as Staph. I would continue with broad antibiotics though oral cipro will be adequate and also continue with vancomycin and if no particular culture grows from LP, would  continue with vanco and cipro for 6-8 weeks. -picc line now in place -will need weekly cbc, cmp, vanco trough per home health protocol to [facility] -antibiotics per home health protocol -we will arrange follow up in [facility] in about 2 weeks -will get baseline CRP and ESR  I will sign off, call with questions.   Scharlene Gloss, [place] for Infectious Disease [place] [email or web address] [phone] pager   [phone] cell 11/22/2013, 11:38 AM

## 2013-11-22 NOTE — Progress Notes (Signed)
[facility]  Patient Status: New pt this admission to [facility]  [facility] is providing the following services: HHRN and [facility] for home IV ABX.  [facility] team will follow pt and support transition home when deemed appropriate by MD.  If patient discharges after hours, please call [phone].   Larry Sierras 11/22/2013, 4:45 PM

## 2013-11-22 NOTE — Progress Notes (Signed)
ANTIBIOTIC CONSULT NOTE - FOLLOW UP  Pharmacy Consult for vancomycin Indication: possible subdural empyema  Allergies  Allergen Reactions  . Morphine And Related Shortness Of Breath, Nausea And Vomiting, Swelling and Other (See Comments)    Agitation, tolerates dilaudid  . Penicillins Hives, Shortness Of Breath and Rash    REACTION: hives, breathing problems  . Cefepime   . Percocet [Oxycodone-Acetaminophen] Anxiety    Takes OxyContin at home.    Patient Measurements: Height: 5' 6.5" (168.9 cm) Weight: 151 lb 7.3 oz (68.7 kg) IBW/kg (Calculated) : 64.95  Vital Signs: Temp: 98.6 F (37 C) (08/17 1548) Temp src: Oral (08/17 1548) BP: 119/70 mmHg (08/17 1245) Pulse Rate: 72 (08/17 1245) Intake/Output from previous day: 08/16 0701 - 08/17 0700 In: 2250 [P.O.:1800; IV Piggyback:450] Out: 3250 [Urine:3250] Intake/Output from this shift: Total I/O In: 2380 [P.O.:480; I.V.:1750; IV Piggyback:150] Out: 1250 [Urine:1250]  Labs:  Recent Labs  11/22/13 1532  CREATININE 0.58   Estimated Creatinine Clearance: 83.5 ml/min (by C-G formula based on Cr of 0.58).  Recent Labs  11/20/13 1610 11/22/13 1531  VANCOTROUGH 10.7 19.3     Microbiology: Recent Results (from the past 720 hour(s))  CULTURE, BLOOD (ROUTINE X 2)     Status: None   Collection Time    10/29/13  8:30 PM      Result Value Ref Range Status   Specimen Description BLOOD ARM RIGHT   Final   Special Requests BOTTLES DRAWN AEROBIC AND ANAEROBIC [facility]   Final   Culture  Setup Time     Final   Value: 10/30/2013 05:11     Performed at [facility]   Culture     Final   Value: NO GROWTH 5 DAYS     Performed at [facility]   Report Status 11/05/2013 FINAL   Final  CULTURE, BLOOD (ROUTINE X 2)     Status: None   Collection Time    10/29/13  8:37 PM      Result Value Ref Range Status   Specimen Description BLOOD ARM LEFT   Final   Special Requests BOTTLES DRAWN AEROBIC AND ANAEROBIC 10CC   Final    Culture  Setup Time     Final   Value: 10/30/2013 05:11     Performed at [facility]   Culture     Final   Value: NO GROWTH 5 DAYS     Performed at [facility]   Report Status 11/05/2013 FINAL   Final  URINE CULTURE     Status: None   Collection Time    10/30/13  4:33 PM      Result Value Ref Range Status   Specimen Description URINE, CLEAN CATCH   Final   Special Requests NONE   Final   Culture  Setup Time     Final   Value: 10/30/2013 21:12     Performed at [place]     Final   Value: 3,000 COLONIES/ML     Performed at [facility]   Culture     Final   Value: INSIGNIFICANT GROWTH     Performed at [facility]   Report Status 10/31/2013 FINAL   Final  CULTURE, BLOOD (ROUTINE X 2)     Status: None   Collection Time    11/18/13  3:32 PM      Result Value Ref Range Status   Specimen Description BLOOD ARM RIGHT  Final   Special Requests     Final   Value: BOTTLES DRAWN AEROBIC AND ANAEROBIC 5CC BLUE 3CCRED   Culture  Setup Time     Final   Value: 11/18/2013 20:38     Performed at [facility]   Culture     Final   Value:        BLOOD CULTURE RECEIVED NO GROWTH TO DATE CULTURE WILL BE HELD FOR 5 DAYS BEFORE ISSUING A FINAL NEGATIVE REPORT     Performed at [facility]   Report Status PENDING   Incomplete  CULTURE, BLOOD (ROUTINE X 2)     Status: None   Collection Time    11/18/13  4:30 PM      Result Value Ref Range Status   Specimen Description BLOOD ARM RIGHT   Final   Special Requests BOTTLES DRAWN AEROBIC AND ANAEROBIC 10CC   Final   Culture  Setup Time     Final   Value: 11/18/2013 20:38     Performed at [facility]   Culture     Final   Value:        BLOOD CULTURE RECEIVED NO GROWTH TO DATE CULTURE WILL BE HELD FOR 5 DAYS BEFORE ISSUING A FINAL NEGATIVE REPORT     Performed at [facility]   Report Status PENDING   Incomplete  MRSA PCR SCREENING     Status: None    Collection Time    11/19/13  3:22 AM      Result Value Ref Range Status   MRSA by PCR NEGATIVE  NEGATIVE Final   Comment:            The GeneXpert MRSA Assay (FDA     approved for NASAL specimens     only), is one component of a     comprehensive MRSA colonization     surveillance program. It is not     intended to diagnose MRSA     infection nor to guide or     monitor treatment for     MRSA infections.  CSF CULTURE     Status: None   Collection Time    11/19/13  1:55 PM      Result Value Ref Range Status   Specimen Description CSF   Final   Special Requests NONE   Final   Gram Stain     Final   Value: CYTOSPIN WBC PRESENT,BOTH PMN AND MONONUCLEAR     NO ORGANISMS SEEN     Performed at [facility]     Performed at [facility]   Culture     Final   Value: NO GROWTH 3 DAYS     Performed at [facility]   Report Status PENDING   Incomplete  GRAM STAIN     Status: None   Collection Time    11/19/13  1:55 PM      Result Value Ref Range Status   Specimen Description CSF   Final   Special Requests NONE   Final   Gram Stain     Final   Value: CYTOSPIN PREP     WBC PRESENT,BOTH PMN AND MONONUCLEAR     NO ORGANISMS SEEN   Report Status 11/19/2013 FINAL   Final    Anti-infectives   Start     Dose/Rate Route Frequency Ordered Stop   11/21/13 0000  vancomycin (VANCOCIN) IVPB 750 mg/150 ml premix     750  mg 150 mL/hr over 60 Minutes Intravenous Every 8 hours 11/20/13 1811     11/20/13 2000  ciprofloxacin (CIPRO) tablet 750 mg     750 mg Oral 2 times daily 11/20/13 1348     11/19/13 0100  vancomycin (VANCOCIN) 500 mg in sodium chloride 0.9 % 100 mL IVPB  Status:  Discontinued     500 mg 100 mL/hr over 60 Minutes Intravenous Every 8 hours 11/18/13 2333 11/20/13 1811   11/19/13 0000  meropenem (MERREM) 2 g in sodium chloride 0.9 % 100 mL IVPB  Status:  Discontinued     2 g 200 mL/hr over 30 Minutes Intravenous Every 8 hours 11/18/13 2333 11/20/13 1348       Assessment: 68 y/o male with history of back surgery and post-op infection currently being treated with vancomycin and Cipro for possible subdural empyema. ID following and recommends 6-8 weeks of vancomycin and ciprofloxacin.   Renal function is stable. Vancomycin trough is therapeutic at 19.3 on 750 mg IV q8h but this is a difficult regimen to maintain outpatient. Will change to a twice daily regimen for ease of administration outpatient.   Goal of Therapy:  Vancomycin trough level 15-20 mcg/ml  Plan:  - Change vancomycin to 1250 mg IV q12h - Trough at steady-state to evaluate therapy - monitor renal function and clinical course  [facility], Pharm.D., BCPS Clinical Pharmacist Pager: [phone] 11/22/2013 4:24 PM

## 2013-11-22 NOTE — Progress Notes (Signed)
Patient ID: [name], male   DOB: 1946/02/14, 68 y.o.   MRN: [id number]  i was out of town when [name] was admitted. i did review his chart including the notes from ID and the radiological w/u. Hopefully he can be discharge this Wednesday once he gets some help at home. To be f/u by ID and [place]. i spoke with [name] and his questions were answered

## 2013-11-22 NOTE — Care Management Note (Signed)
    Page 1 of 2   11/25/2013     12:41:27 PM CARE MANAGEMENT NOTE 11/25/2013  Patient:  RIKKI, SMESTAD   Account Number:  [id number]  Date Initiated:  11/19/2013  Documentation initiated by:  Marvetta Gibbons  Subjective/Objective Assessment:   Pt admitted with sepsis ?lumbar infection     Action/Plan:   PTA pt lived at home with family- [facility] services with [facility] for aide   Anticipated DC Date:  11/25/2013   Anticipated DC Plan:  [place]  CM consult      [place]   Choice offered to / List presented to:  C-1 Patient        [place] arranged  HH-1 RN  IV Antibiotics      [place].   Status of service:  In process, will continue to follow Medicare Important Message given?  YES (If response is "NO", the following Medicare IM given date fields will be blank) Date Medicare IM given:  11/22/2013 Medicare IM given by:  Marvetta Gibbons Date Additional Medicare IM given:  11/24/2013 Additional Medicare IM given by:  Marvetta Gibbons  Discharge Disposition:  [place]  Per UR Regulation:  Reviewed for med. necessity/level of care/duration of stay  If discussed at [place] of Stay Meetings, dates discussed:   11/23/2013  11/25/2013    Comments:  11/25/13- 0930- Marvetta Gibbons RN, BSN [phone] Pt for d/c today- [facility] has been notified.  11/24/13- 1200- Marvetta Gibbons RN, BSN [phone] Confirmed with [facility] arrangements  for HH-RN and IV abx- plan for d/c in am - per conversation with pt this am- plan is to go home with son to start with- pt also states that [place]- have given [facility] a heads up on pts plan and that will be worked out with [facility] if needed.   11/22/13- [place] RN, BSN [phone] Per ID notes pt will need 6-8 wks of iV abx at discharge- pt has PICC placed already- in to speak with pt at bedside- per conversation he has used both [facility]  and [place] in past- he has been staying with son who lives in [place], he and his wife live here in [place]- pt is unsure at this time if he will go home with son or back home to wife (who has been sick with PNA recently) per choice pt would like to use [facility] for [facility] services this time -referral for HH-RN called to Clover Mealy with [facility],  referral for IV abx called to Pam with [facility]- MD please write orders for HH-RN for iv abx include abx and dose.

## 2013-11-23 ENCOUNTER — Ambulatory Visit: Payer: Medicare Other | Admitting: Physical Therapy

## 2013-11-23 ENCOUNTER — Telehealth: Payer: Self-pay

## 2013-11-23 LAB — CSF CULTURE

## 2013-11-23 LAB — CSF CULTURE W GRAM STAIN: Culture: NO GROWTH

## 2013-11-23 NOTE — Progress Notes (Signed)
Patient complaining of nausea. Zofran administered. Concerned that he has not had a bowel movement since 8/13, despite bowel regimen. Bowel sounds active, abdomen tender. Patient states that he has had issues with constipation in the past and has required fleets enemas. Neurosurgery answering service called. Awaiting response from on-call physician. Will continue to monitor.   Lum Babe, RN

## 2013-11-23 NOTE — Telephone Encounter (Signed)
[place] [employer] notified us they have approved our request for coverage on Ondansetron effective until 11/12/2014  Ref Member # [id number]

## 2013-11-23 NOTE — Progress Notes (Signed)
Patient ID: [name], male   DOB: 09/27/1945, 68 y.o.   MRN: [id number] Feeling better. To be discharge in am at the care of his son. No complains

## 2013-11-24 LAB — CULTURE, BLOOD (ROUTINE X 2)
Culture: NO GROWTH
Culture: NO GROWTH

## 2013-11-24 LAB — VANCOMYCIN, TROUGH: Vancomycin Tr: 18.7 ug/mL (ref 10.0–20.0)

## 2013-11-24 MED ORDER — FLEET ENEMA 7-19 GM/118ML RE ENEM
1.0000 | ENEMA | Freq: Every day | RECTAL | Status: DC | PRN
  Filled 2013-11-24: qty 1

## 2013-11-24 NOTE — Progress Notes (Signed)
Able to have bowel movement, refuse enema. Had large brown stool.  Feels better, no complaints.

## 2013-11-24 NOTE — Progress Notes (Signed)
Patient ID: [name], male   DOB: [birth year], 68 y.o.   MRN: [id number] No bm x 1 week. C/o migraine. Lives alone. Son will get him tomorrow to take care of him. Continue abs

## 2013-11-24 NOTE — Progress Notes (Signed)
ANTIBIOTIC CONSULT NOTE - FOLLOW UP  Pharmacy Consult for vancomycin Indication: possible subdural empyema  Allergies  Allergen Reactions  . Morphine And Related Shortness Of Breath, Nausea And Vomiting, Swelling and Other (See Comments)    Agitation, tolerates dilaudid  . Penicillins Hives, Shortness Of Breath and Rash    REACTION: hives, breathing problems  . Cefepime   . Percocet [Oxycodone-Acetaminophen] Anxiety    Takes OxyContin at home.    Patient Measurements: Height: 5' 6.5" (168.9 cm) Weight: 151 lb 7.3 oz (68.7 kg) IBW/kg (Calculated) : 64.95  Vital Signs: Temp: 97.5 F (36.4 C) (08/19 1500) Temp src: Oral (08/19 1500) BP: 109/60 mmHg (08/19 1442) Pulse Rate: 66 (08/19 1443) Intake/Output from previous day: 08/18 0701 - 08/19 0700 In: 2260 [P.O.:960; I.V.:1050; IV Piggyback:250] Out: 2400 [Urine:2400] Intake/Output from this shift: Total I/O In: 250 [I.V.:250] Out: -   Labs:  Recent Labs  11/22/13 1532  CREATININE 0.58   Estimated Creatinine Clearance: 83.5 ml/min (by C-G formula based on Cr of 0.58).  Recent Labs  11/22/13 1531 11/24/13 1630  VANCOTROUGH 19.3 18.7     Microbiology: Recent Results (from the past 720 hour(s))  CULTURE, BLOOD (ROUTINE X 2)     Status: None   Collection Time    10/29/13  8:30 PM      Result Value Ref Range Status   Specimen Description BLOOD ARM RIGHT   Final   Special Requests BOTTLES DRAWN AEROBIC AND ANAEROBIC [facility]   Final   Culture  Setup Time     Final   Value: 10/30/2013 05:11     Performed at [facility]   Culture     Final   Value: NO GROWTH 5 DAYS     Performed at [facility]   Report Status 11/05/2013 FINAL   Final  CULTURE, BLOOD (ROUTINE X 2)     Status: None   Collection Time    10/29/13  8:37 PM      Result Value Ref Range Status   Specimen Description BLOOD ARM LEFT   Final   Special Requests BOTTLES DRAWN AEROBIC AND ANAEROBIC 10CC   Final   Culture  Setup Time     Final    Value: 10/30/2013 05:11     Performed at [facility]   Culture     Final   Value: NO GROWTH 5 DAYS     Performed at [facility]   Report Status 11/05/2013 FINAL   Final  URINE CULTURE     Status: None   Collection Time    10/30/13  4:33 PM      Result Value Ref Range Status   Specimen Description URINE, CLEAN CATCH   Final   Special Requests NONE   Final   Culture  Setup Time     Final   Value: 10/30/2013 21:12     Performed at [place]     Final   Value: 3,000 COLONIES/ML     Performed at [facility]   Culture     Final   Value: INSIGNIFICANT GROWTH     Performed at [facility]   Report Status 10/31/2013 FINAL   Final  CULTURE, BLOOD (ROUTINE X 2)     Status: None   Collection Time    11/18/13  3:32 PM      Result Value Ref Range Status   Specimen Description BLOOD ARM RIGHT   Final  Special Requests     Final   Value: BOTTLES DRAWN AEROBIC AND ANAEROBIC 5CC BLUE 3CCRED   Culture  Setup Time     Final   Value: 11/18/2013 20:38     Performed at [facility]   Culture     Final   Value: NO GROWTH 5 DAYS     Performed at [facility]   Report Status 11/24/2013 FINAL   Final  CULTURE, BLOOD (ROUTINE X 2)     Status: None   Collection Time    11/18/13  4:30 PM      Result Value Ref Range Status   Specimen Description BLOOD ARM RIGHT   Final   Special Requests BOTTLES DRAWN AEROBIC AND ANAEROBIC 10CC   Final   Culture  Setup Time     Final   Value: 11/18/2013 20:38     Performed at [facility]   Culture     Final   Value: NO GROWTH 5 DAYS     Performed at [facility]   Report Status 11/24/2013 FINAL   Final  MRSA PCR SCREENING     Status: None   Collection Time    11/19/13  3:22 AM      Result Value Ref Range Status   MRSA by PCR NEGATIVE  NEGATIVE Final   Comment:            The GeneXpert MRSA Assay (FDA     approved for NASAL specimens     only), is one component  of a     comprehensive MRSA colonization     surveillance program. It is not     intended to diagnose MRSA     infection nor to guide or     monitor treatment for     MRSA infections.  CSF CULTURE     Status: None   Collection Time    11/19/13  1:55 PM      Result Value Ref Range Status   Specimen Description CSF   Final   Special Requests NONE   Final   Gram Stain     Final   Value: CYTOSPIN WBC PRESENT,BOTH PMN AND MONONUCLEAR     NO ORGANISMS SEEN     Performed at [facility]     Performed at [facility]   Culture     Final   Value: NO GROWTH 3 DAYS     Performed at [facility]   Report Status 11/23/2013 FINAL   Final  GRAM STAIN     Status: None   Collection Time    11/19/13  1:55 PM      Result Value Ref Range Status   Specimen Description CSF   Final   Special Requests NONE   Final   Gram Stain     Final   Value: CYTOSPIN PREP     WBC PRESENT,BOTH PMN AND MONONUCLEAR     NO ORGANISMS SEEN   Report Status 11/19/2013 FINAL   Final    Anti-infectives   Start     Dose/Rate Route Frequency Ordered Stop   11/22/13 1700  vancomycin (VANCOCIN) 1,250 mg in sodium chloride 0.9 % 250 mL IVPB     1,250 mg 166.7 mL/hr over 90 Minutes Intravenous Every 12 hours 11/22/13 1641     11/21/13 0000  vancomycin (VANCOCIN) IVPB 750 mg/150 ml premix  Status:  Discontinued     750 mg 150 mL/hr over 60 Minutes  Intravenous Every 8 hours 11/20/13 1811 11/22/13 1641   11/20/13 2000  ciprofloxacin (CIPRO) tablet 750 mg     750 mg Oral 2 times daily 11/20/13 1348     11/19/13 0100  vancomycin (VANCOCIN) 500 mg in sodium chloride 0.9 % 100 mL IVPB  Status:  Discontinued     500 mg 100 mL/hr over 60 Minutes Intravenous Every 8 hours 11/18/13 2333 11/20/13 1811   11/19/13 0000  meropenem (MERREM) 2 g in sodium chloride 0.9 % 100 mL IVPB  Status:  Discontinued     2 g 200 mL/hr over 30 Minutes Intravenous Every 8 hours 11/18/13 2333 11/20/13 1348       Assessment: 68 y/o male with history of back surgery and post-op infection currently being treated with vancomycin and Cipro for possible subdural empyema. ID following and recommends 6-8 weeks of vancomycin and ciprofloxacin, likely discharge tomorrow, Vancomycin changed to 1250 mg Q 12 hrs for convenient dosing at home. Renal function has been stable. Vancomycin trough 18.7 at goal, drawn at appropriate time.   Goal of Therapy:  Vancomycin trough level 15-20 mcg/ml  Plan:  - Continue vancomycin 1250 mg IV q12h - Recommend to monitor vancomycin trough and scr weekly outpatient while he is on vancomycin. - monitor renal function and clinical course  Maryanna Shape, PharmD, BCPS  Clinical Pharmacist  Pager: [phone]   11/24/2013 5:36 PM

## 2013-11-25 ENCOUNTER — Telehealth: Payer: Self-pay | Admitting: Internal Medicine

## 2013-11-25 ENCOUNTER — Ambulatory Visit: Payer: Medicare Other | Admitting: Physical Therapy

## 2013-11-25 MED ORDER — VANCOMYCIN HCL 10 G IV SOLR: 1250.0000 mg | Freq: Two times a day (BID) | INTRAVENOUS | Status: AC

## 2013-11-25 MED ORDER — CIPROFLOXACIN HCL 750 MG PO TABS: 750.0000 mg | ORAL_TABLET | Freq: Two times a day (BID) | ORAL | Status: DC

## 2013-11-25 MED ORDER — ONDANSETRON HCL 4 MG PO TABS: 4.0000 mg | ORAL_TABLET | Freq: Three times a day (TID) | ORAL | Status: DC | PRN

## 2013-11-25 NOTE — Progress Notes (Signed)
Discharge instructions given to patient and family member with teach-back. Discharge delay due to family member not here until 2pm.  No complaints.

## 2013-11-25 NOTE — Telephone Encounter (Signed)
Ok Thx 

## 2013-11-25 NOTE — Discharge Summary (Signed)
Physician Discharge Summary  Patient ID: [name] MRN: [id number] DOB/AGE: September 21, 1945 68 y.o.  Admit date: 11/18/2013 Discharge date: 11/25/2013  Admission Diagnoses:osteomylitis of lumbo-sacral spine  Discharge Diagnoses:  Principal Problem:   Infection of lumbar spine Active Problems:   Rheumatoid arthritis(714.0)   COPD (chronic obstructive pulmonary disease)   Protein-calorie malnutrition, severe   Degenerative arthritis of spine   Fever   Discharged Condition: decrease of pain  Hospital Course: iv abs  Consults ID--IR  Significant Diagnostic Studies: lumbar mri. LP  Treatments: IV ANTIBIOTICS  Discharge Exam: Blood pressure 100/62, pulse 60, temperature 97.8 F (36.6 C), temperature source Oral, resp. rate 16, height 5' 6.5" (1.689 m), weight 68.7 kg (151 lb 7.3 oz), SpO2 90.00%. No weakness. Decrease of pain  Disposition: 01-Home or Self Care.  To be seen by ID and me  In my office     Medication List    ASK your doctor about these medications       ALPRAZolam 0.5 MG tablet  Commonly known as:  XANAX  Take 1 tablet by mouth daily as needed for anxiety.     aspirin 81 MG EC tablet  Commonly known as:  aspirin EC  Take 1 tablet (81 mg total) by mouth every morning.     cholecalciferol 1000 UNITS tablet  Commonly known as:  VITAMIN D  Take 1 tablet (1,000 Units total) by mouth daily.     DALIRESP 500 MCG Tabs tablet  Generic drug:  roflumilast  Take 1 tablet by mouth daily.     divalproex 500 MG 24 hr tablet  Commonly known as:  DEPAKOTE ER  Take 1 tablet (500 mg total) by mouth at bedtime.     FLUoxetine 20 MG capsule  Commonly known as:  PROZAC  Take 20 mg by mouth daily.     fluticasone 50 MCG/ACT nasal spray  Commonly known as:  FLONASE  Place 2 sprays into both nostrils daily.     gabapentin 300 MG capsule  Commonly known as:  NEURONTIN  Take 1 capsule (300 mg total) by mouth 3 (three) times daily.     losartan-hydrochlorothiazide 50-12.5 MG per tablet  Commonly known as:  HYZAAR  Take 1 tablet by mouth daily.     methocarbamol 500 MG tablet  Commonly known as:  ROBAXIN  Take 1,000 mg by mouth at bedtime.     metoprolol tartrate 25 MG tablet  Commonly known as:  LOPRESSOR  Take 1 tablet (25 mg total) by mouth 2 (two) times daily.     naproxen sodium 220 MG tablet  Commonly known as:  ANAPROX  Take 440 mg by mouth 2 (two) times daily as needed (for pain).     ondansetron 4 MG tablet  Commonly known as:  ZOFRAN  Take 1 tablet (4 mg total) by mouth every 8 (eight) hours as needed for nausea or vomiting.     pantoprazole 20 MG tablet  Commonly known as:  PROTONIX  Take 2 tablets (40 mg total) by mouth 2 (two) times daily.     potassium chloride SA 20 MEQ tablet  Commonly known as:  KLOR-CON M20  Take 1 tablet (20 mEq total) by mouth 2 (two) times daily.     predniSONE 10 MG tablet  Commonly known as:  DELTASONE  Take 10 mg by mouth daily.     rizatriptan 10 MG disintegrating tablet  Commonly known as:  MAXALT-MLT  Take 1 tablet (10 mg total) by mouth as  needed for migraine. May repeat in 2 hours if needed     simvastatin 20 MG tablet  Commonly known as:  ZOCOR  Take 1 tablet (20 mg total) by mouth at bedtime.           Follow-up Information   Follow up with [place].   Contact information:   [place] [address] [phone]       Signed: Floyce Stakes 11/25/2013, 9:27 AM

## 2013-11-25 NOTE — Telephone Encounter (Signed)
Patient is getting released from hospital and would like a script for nausea 77m called in to [facility] on [address].  Patient states Dr. PAlain Marionwas going to do this last time he was in but did not receive.

## 2013-11-26 ENCOUNTER — Ambulatory Visit: Payer: Medicare Other | Admitting: Physical Therapy

## 2013-11-26 NOTE — Telephone Encounter (Signed)
Called pt no answer LMOM [facility].../lmb

## 2013-12-10 ENCOUNTER — Encounter: Payer: Self-pay | Admitting: Internal Medicine

## 2013-12-10 ENCOUNTER — Ambulatory Visit (INDEPENDENT_AMBULATORY_CARE_PROVIDER_SITE_OTHER): Payer: Medicare Other | Admitting: Internal Medicine

## 2013-12-10 VITALS — BP 157/94 | HR 98 | Temp 97.9°F | Ht 65.0 in | Wt 162.0 lb

## 2013-12-10 DIAGNOSIS — M869 Osteomyelitis, unspecified: Secondary | ICD-10-CM

## 2013-12-10 DIAGNOSIS — Z23 Encounter for immunization: Secondary | ICD-10-CM

## 2013-12-10 DIAGNOSIS — M4626 Osteomyelitis of vertebra, lumbar region: Secondary | ICD-10-CM

## 2013-12-10 NOTE — Progress Notes (Signed)
   Subjective:    Patient ID: [name], male    DOB: 1945-10-11, 68 y.o.   MRN: [id number]  HPI [name] is a 68 year old gentleman with rheumatoid arthritis and degenerative spine disease who underwent lumbar fusion last November. He developed postoperative wound infection and was readmitted in December. He underwent incision and drainage and operative cultures revealed Pseudomonas. He received 6 weeks of IV cefepime completing therapy on January 24. He developed a diffuse rash of the tailor his therapy that was felt to be an allergic reaction to cefepime. He was supposed to transition to oral ciprofloxacin but never made it back to our clinic. He has had multiple readmissions since that time for altered mental status and headache. Fluid was aspirated from the lumbar region in March and was negative on Gram stain and culture. He describes increasing headache and low back pain for the past 2-1/2 months. He's had intermittent fever and chills.   He came in 8/13 with fever, headache and MRI concerning for postoperative infection/meningitis/subdural empyema which had progressed from earlier scans.  CSF culture was sent and he was started on empiric antibiotics with vanocmycin and meropenem.  Cultures though remained negative during the hospitalization and he was discharged on vancomycin and cipro (Pseudomonas was sens) for 6 weeks.  He is now 2 weeks into his course.  No headache and he feels much better.  No rash, no diarrhea.  No further headaches.   He is not sure if he still has cipro.     Review of Systems  Constitutional: Negative for fever and fatigue.  Gastrointestinal: Negative for nausea, abdominal pain and diarrhea.  Genitourinary: Negative for decreased urine volume.  Skin: Negative for rash.  Neurological: Negative for dizziness and light-headedness.       Objective:   Physical Exam  Constitutional: He appears well-developed and well-nourished. No distress.  Eyes: No  scleral icterus.  Cardiovascular: Normal rate, regular rhythm and normal heart sounds.   No murmur heard. Skin: No rash noted.          Assessment & Plan:

## 2013-12-10 NOTE — Assessment & Plan Note (Signed)
He seems to be doing really well.  Will continue for 6 weeks through Sept 24th or until his next visit and consider stopping if he is still doing well.  He is going to check with his pharmacy to be sure he has refills of his cipro 750 mg bid or call if he needs more.  RTC 3-4 weeks

## 2013-12-14 ENCOUNTER — Telehealth: Payer: Self-pay | Admitting: *Deleted

## 2013-12-14 NOTE — Telephone Encounter (Signed)
Received call from [facility] at [facility] with critical results from labs drawn 9/7: Vanc trough (36.6), BUN (26), Creatinine (1.82), ESR (19), WBC (9.5). Per Jeani Hawking, patient denies any s/s, no headache, nausea, ringing in his ears.  Patient endorses darker urine.  Per Jeani Hawking, he is to hold today's AM dose and increase his fluid intake.  Nursing will go out 9/9 am to redraw the labs.  Jeani Hawking will adjust his regimen based on those labs results.

## 2013-12-22 ENCOUNTER — Other Ambulatory Visit: Payer: Self-pay | Admitting: Internal Medicine

## 2013-12-28 ENCOUNTER — Telehealth: Payer: Self-pay | Admitting: *Deleted

## 2013-12-28 NOTE — Telephone Encounter (Signed)
ALERT high Vancomycin trough, 12/27/13.  Jeani Hawking, [facility] pharmacist, confirmed that vancomycin dose will be adjusted.

## 2013-12-31 ENCOUNTER — Encounter: Payer: Self-pay | Admitting: Internal Medicine

## 2013-12-31 ENCOUNTER — Ambulatory Visit (INDEPENDENT_AMBULATORY_CARE_PROVIDER_SITE_OTHER): Payer: Medicare Other | Admitting: Internal Medicine

## 2013-12-31 ENCOUNTER — Other Ambulatory Visit (INDEPENDENT_AMBULATORY_CARE_PROVIDER_SITE_OTHER): Payer: Medicare Other

## 2013-12-31 VITALS — BP 132/80 | HR 70 | Temp 98.2°F | Ht 65.0 in | Wt 166.0 lb

## 2013-12-31 DIAGNOSIS — R0609 Other forms of dyspnea: Secondary | ICD-10-CM

## 2013-12-31 DIAGNOSIS — Z0000 Encounter for general adult medical examination without abnormal findings: Secondary | ICD-10-CM

## 2013-12-31 DIAGNOSIS — R7309 Other abnormal glucose: Secondary | ICD-10-CM

## 2013-12-31 DIAGNOSIS — R7302 Impaired glucose tolerance (oral): Secondary | ICD-10-CM

## 2013-12-31 DIAGNOSIS — Z7189 Other specified counseling: Secondary | ICD-10-CM | POA: Insufficient documentation

## 2013-12-31 DIAGNOSIS — R0600 Dyspnea, unspecified: Secondary | ICD-10-CM

## 2013-12-31 DIAGNOSIS — Z23 Encounter for immunization: Secondary | ICD-10-CM

## 2013-12-31 DIAGNOSIS — R0989 Other specified symptoms and signs involving the circulatory and respiratory systems: Secondary | ICD-10-CM

## 2013-12-31 DIAGNOSIS — G894 Chronic pain syndrome: Secondary | ICD-10-CM

## 2013-12-31 LAB — URINALYSIS, ROUTINE W REFLEX MICROSCOPIC
Bilirubin Urine: NEGATIVE
Hgb urine dipstick: NEGATIVE
Ketones, ur: NEGATIVE
Leukocytes, UA: NEGATIVE
Nitrite: NEGATIVE
Specific Gravity, Urine: 1.015 (ref 1.000–1.030)
Total Protein, Urine: NEGATIVE
Urine Glucose: NEGATIVE
Urobilinogen, UA: 0.2 (ref 0.0–1.0)
pH: 5 (ref 5.0–8.0)

## 2013-12-31 LAB — LIPID PANEL
Cholesterol: 139 mg/dL (ref 0–200)
HDL: 41.6 mg/dL (ref >39.00)
LDL Cholesterol: 89 mg/dL (ref 0–99)
NonHDL: 97.4
Total CHOL/HDL Ratio: 3
Triglycerides: 44 mg/dL (ref 0.0–149.0)
VLDL: 8.8 mg/dL (ref 0.0–40.0)

## 2013-12-31 LAB — CBC WITH DIFFERENTIAL/PLATELET
Basophils Absolute: 0 10*3/uL (ref 0.0–0.1)
Basophils Relative: 0.2 % (ref 0.0–3.0)
Eosinophils Absolute: 0.2 10*3/uL (ref 0.0–0.7)
Eosinophils Relative: 2.2 % (ref 0.0–5.0)
HCT: 32.9 % — ABNORMAL LOW (ref 39.0–52.0)
Hemoglobin: 11 g/dL — ABNORMAL LOW (ref 13.0–17.0)
Lymphocytes Relative: 30.4 % (ref 12.0–46.0)
Lymphs Abs: 2.3 10*3/uL (ref 0.7–4.0)
MCHC: 33.3 g/dL (ref 30.0–36.0)
MCV: 96.6 fl (ref 78.0–100.0)
Monocytes Absolute: 0.8 10*3/uL (ref 0.1–1.0)
Monocytes Relative: 10.8 % (ref 3.0–12.0)
Neutro Abs: 4.2 10*3/uL (ref 1.4–7.7)
Neutrophils Relative %: 56.4 % (ref 43.0–77.0)
Platelets: 261 10*3/uL (ref 150.0–400.0)
RBC: 3.41 Mil/uL — ABNORMAL LOW (ref 4.22–5.81)
RDW: 13.7 % (ref 11.5–15.5)
WBC: 7.4 10*3/uL (ref 4.0–10.5)

## 2013-12-31 LAB — BASIC METABOLIC PANEL
BUN: 42 mg/dL — ABNORMAL HIGH (ref 6–23)
CO2: 25 mEq/L (ref 19–32)
Calcium: 8.6 mg/dL (ref 8.4–10.5)
Chloride: 104 mEq/L (ref 96–112)
Creatinine, Ser: 1.6 mg/dL — ABNORMAL HIGH (ref 0.4–1.5)
GFR: 45.77 mL/min — ABNORMAL LOW (ref >60.00)
Glucose, Bld: 122 mg/dL — ABNORMAL HIGH (ref 70–99)
Potassium: 4.5 mEq/L (ref 3.5–5.1)
Sodium: 135 mEq/L (ref 135–145)

## 2013-12-31 LAB — HEPATIC FUNCTION PANEL
ALT: 18 U/L (ref 0–53)
AST: 16 U/L (ref 0–37)
Albumin: 3.5 g/dL (ref 3.5–5.2)
Alkaline Phosphatase: 52 U/L (ref 39–117)
Bilirubin, Direct: 0.1 mg/dL (ref 0.0–0.3)
Total Bilirubin: 0.7 mg/dL (ref 0.2–1.2)
Total Protein: 6.4 g/dL (ref 6.0–8.3)

## 2013-12-31 LAB — HEMOGLOBIN A1C: Hgb A1c MFr Bld: 6 % (ref 4.6–6.5)

## 2013-12-31 LAB — TSH: TSH: 1.79 u[IU]/mL (ref 0.35–4.50)

## 2013-12-31 MED ORDER — ALBUTEROL SULFATE HFA 108 (90 BASE) MCG/ACT IN AERS: 2.0000 | INHALATION_SPRAY | Freq: Four times a day (QID) | RESPIRATORY_TRACT | Status: DC | PRN

## 2013-12-31 MED ORDER — CELECOXIB 100 MG PO CAPS: 100.0000 mg | ORAL_CAPSULE | Freq: Two times a day (BID) | ORAL | Status: DC | PRN

## 2013-12-31 NOTE — Assessment & Plan Note (Signed)
Etiology unclear of episode yesterday, exam today benign, for Alb MDI prn further symtpoms

## 2013-12-31 NOTE — Assessment & Plan Note (Signed)

## 2013-12-31 NOTE — Assessment & Plan Note (Signed)
For re-start celebrex 100 bid prn

## 2013-12-31 NOTE — Addendum Note (Signed)
Addended by: Lowella Dandy on: 12/31/2013 11:58 AM   Modules accepted: Orders

## 2013-12-31 NOTE — Patient Instructions (Addendum)
You had the new Prevnar pneumonia shot today  You would be due for the Pneumovax (old pneumonia shot) in 1 year  Please take all new medication as prescribed  - the celebrex, and the inhaler  Please continue all other medications as before, and refills have been done if requested.  Please have the pharmacy call with any other refills you may need.  Please continue your efforts at being more active, low cholesterol diet, and weight control.  You are otherwise up to date with prevention measures today.  Please keep your appointments with your specialists as you may have planned  Please go to the LAB in the Basement (turn left off the elevator) for the tests to be done today  You will be contacted by phone if any changes need to be made immediately.  Otherwise, you will receive a letter about your results with an explanation, but please check with MyChart first.  Please keep your follow up appt in 3 wks with Dr Alain Marion

## 2013-12-31 NOTE — Assessment & Plan Note (Signed)
Very mild in past, asympt, for a1c

## 2013-12-31 NOTE — Progress Notes (Signed)
Pre visit review using our clinic review tool, if applicable. No additional management support is needed unless otherwise documented below in the visit note. 

## 2013-12-31 NOTE — Progress Notes (Signed)
Subjective:    Patient ID: [name], male    DOB: [birth year], 68 y.o.   MRN: [id number]  HPI  Here for wellness and f/u; PCP is Dr Alain Marion, here with me due to need for exam and Dr Mamie Nick not available today  Overall doing ok;  Pt denies CP, wheezing, orthopnea, PND, worsening LE edema, palpitations, dizziness or syncope but did have some unusual sob/doe/wheeziness yesterday noted by him and RN, now resolved, but asking for inhaler for future.  Pt denies neurological change such as new headache, facial or extremity weakness.  Pt denies polydipsia, polyuria, or low sugar symptoms. Pt states overall good compliance with treatment and medications, good tolerability, and has been trying to follow lower cholesterol diet.  Pt denies worsening depressive symptoms, suicidal ideation or panic. No fever, night sweats, wt loss, loss of appetite, or other constitutional symptoms.  Pt states good ability with ADL's, has low fall risk, home safety reviewed and adequate, no other significant changes in hearing or vision, and only occasionally active with exercise. Did have recent bacteremia/osteomyelitis LS spine prolonged complicated course over 6 mo.  Still on IV antibx qod, sees ID/Dr Linus Salmons.  No further HA on vancomycin.  Sees urology , last PSA aabout 3 mo ago at approx 1.2.  Only other c/o pain (arthritic) now off the Humira, used to be on celebrex Past Medical History  Diagnosis Date  . Hx of colonic polyps   . Diverticulosis   . Hypertension   . 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 CAD (coronary artery disease)     minimal coronary plaque in the LAD and right coronary system. 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 7-06. Last nuclear stress 11-17-06 fixed anterior/ inferior defect, no inducible ischemia, EF 81%  . Allergic rhinitis   . Hx of colonoscopy   . Dysrhythmia 01-24-12    past  hx. A.Fib x1 episode-responded to med.  . Depression   . GERD (gastroesophageal reflux disease)   . COPD (chronic obstructive pulmonary disease)     pt denies this hx on 05/24/2013  . Osteoarthritis   . RA (rheumatoid arthritis)   . Chronic back pain     "all over back" (06/16/2013)  . PONV (postoperative nausea and vomiting)   . High cholesterol   . Sleep apnea     "never dx'd; snores bad" (06/16/2013)  . Daily headache   . Falls frequently     "since 02/2013" (06/16/2013)  . Anxiety   . Narcotic abuse     per family  . Alcoholism /alcohol abuse     per family  . Dementia    Past Surgical History  Procedure Laterality Date  . Appendectomy    . Total knee arthroplasty Right 2002     partial  . Shoulder hemi-arthroplasty Left 2008    partial  . Foot surgery Right 11-08    for removal of bone spurs-  . Ankle reconstruction Right 6-09    [facility]  . Posterior lumbar fusion  12-10    L4-5 diskectomy w/ fusion, cage placement and rods; [place]  . Partial knee arthroplasty Left   . Total shoulder replacement Right   . Joint replacement    . Cataract extraction, bilateral Bilateral 01-24-12  . Orif shoulder fracture  02/06/2012    Procedure: OPEN REDUCTION INTERNAL FIXATION (ORIF) SHOULDER FRACTURE;  Surgeon:  Nita Sells, MD;  Location: [facility];  Service: Orthopedics;  Laterality: Left;  ORIF of a Left Shoulder Fracture with  Iliac Crest Bone Graft aspiration   . Harvest bone graft  02/06/2012    Procedure: HARVEST ILIAC BONE GRAFT;  Surgeon: Nita Sells, MD;  Location: [facility];  Service: Orthopedics;;  bone marrow aspirqation   . Hardware removal  03/09/2012    Procedure: HARDWARE REMOVAL;  Surgeon: Nita Sells, MD;  Location: [facility];  Service: Orthopedics;  Laterality: Left;  Hardware Removal from Left Shoulder  . Shoulder arthroscopy Right 2013  . Carpal tunnel release Right 1990's  . Total ankle replacement Right 2008    at  [facility]  . Hammer toe surgery Right 07/2012    "broke 4 hammertoes"   . Posterior lumbar fusion 4 level N/A 03/02/2013    Procedure: Lumbar One to Sacral One Posterior lumbar interbody fusion;  Surgeon: Floyce Stakes, MD;  Location: [place] [facility];  Service: Neurosurgery;  Laterality: N/A;  L1 to S1 Posterior lumbar interbody fusion  . Lumbar wound debridement N/A 03/17/2013    Procedure: Incision and drainage of superficial lumbar wound;  Surgeon: Floyce Stakes, MD;  Location: [place] [facility];  Service: Neurosurgery;  Laterality: N/A;  Incision and drainage of superficial lumbar wound  . Tonsillectomy    . Coronary angioplasty with stent placement  2006    "while repairing 1st stent, a second area tore and they had to place 2nd stent " ?LAD & CX  . Inguinal hernia repair Bilateral     reports that he has never smoked. He has never used smokeless tobacco. He reports that he does not drink alcohol or use illicit drugs. family history includes Cancer in his father and mother; Colon polyps in his father; Coronary artery disease in his father and maternal aunt; Heart attack in his brother and maternal aunt; Hemochromatosis in his sister; Hyperlipidemia in his brother; Hypertension in his father; Lung cancer in his mother; Macular degeneration in his mother; Other in his brother and mother; Prostate cancer in his father; Rheum arthritis in his sister; Thyroid disease in his sister; Uterine cancer in his mother. There is no history of Diabetes, Colon cancer, Esophageal cancer, Rectal cancer, or Stomach cancer. Allergies  Allergen Reactions  . Morphine And Related Shortness Of Breath, Nausea And Vomiting, Swelling and Other (See Comments)    Agitation, tolerates dilaudid  . Penicillins Hives, Shortness Of Breath and Rash    REACTION: hives, breathing problems  . Cefepime   . Percocet [Oxycodone-Acetaminophen] Anxiety    Takes OxyContin at home.   Current Outpatient Prescriptions on File Prior to Visit    Medication Sig Dispense Refill  . ALPRAZolam (XANAX) 0.5 MG tablet Take 1 tablet by mouth daily as needed for anxiety.       [name] aspirin (ASPIRIN EC) 81 MG EC tablet Take 1 tablet (81 mg total) by mouth every morning.  30 tablet  12  . cholecalciferol (VITAMIN D) 1000 UNITS tablet Take 1 tablet (1,000 Units total) by mouth daily.  100 tablet  3  . ciprofloxacin (CIPRO) 750 MG tablet Take 1 tablet (750 mg total) by mouth 2 (two) times daily.  100 tablet  0  . DALIRESP 500 MCG TABS tablet Take 1 tablet by mouth daily.      . divalproex (DEPAKOTE ER) 500 MG 24 hr tablet Take 1 tablet (500 mg total) by mouth at bedtime.  90 tablet  2  . FLUoxetine (PROZAC) 20 MG capsule Take 20 mg by mouth daily.       . fluticasone (FLONASE) 50 MCG/ACT nasal spray Place 2 sprays into both nostrils daily.  16 g  5  . gabapentin (NEURONTIN) 300 MG capsule TAKE 2 CAPSULES BY MOUTH TWICE A DAY  60 capsule  1  . HYDROmorphone (DILAUDID) 4 MG tablet       . losartan-hydrochlorothiazide (HYZAAR) 50-12.5 MG per tablet Take 1 tablet by mouth daily.  90 tablet  3  . methocarbamol (ROBAXIN) 500 MG tablet Take 1,000 mg by mouth at bedtime.       . metoprolol tartrate (LOPRESSOR) 25 MG tablet Take 1 tablet (25 mg total) by mouth 2 (two) times daily.  60 tablet  1  . naproxen sodium (ANAPROX) 220 MG tablet Take 440 mg by mouth 2 (two) times daily as needed (for pain).      . ondansetron (ZOFRAN) 4 MG tablet Take 1 tablet (4 mg total) by mouth every 8 (eight) hours as needed for nausea or vomiting.  20 tablet  1  . pantoprazole (PROTONIX) 20 MG tablet Take 2 tablets (40 mg total) by mouth 2 (two) times daily.  60 tablet  11  . potassium chloride SA (KLOR-CON M20) 20 MEQ tablet Take 1 tablet (20 mEq total) by mouth 2 (two) times daily.  60 tablet  5  . predniSONE (DELTASONE) 10 MG tablet Take 10 mg by mouth daily.      . rizatriptan (MAXALT-MLT) 10 MG disintegrating tablet Take 1 tablet (10 mg total) by mouth as needed for migraine.  May repeat in 2 hours if needed  8 tablet  5  . simvastatin (ZOCOR) 20 MG tablet Take 1 tablet (20 mg total) by mouth at bedtime.  90 tablet  3  . vancomycin 1,250 mg in sodium chloride 0.9 % 250 mL Inject 1,250 mg into the vein every 12 (twelve) hours.  1250 mg  100   No current facility-administered medications on file prior to visit.    Review of Systems Constitutional: Negative for increased diaphoresis, other activity, appetite or other siginficant weight change  HENT: Negative for worsening hearing loss, ear pain, facial swelling, mouth sores and neck stiffness.   Eyes: Negative for other worsening pain, redness or visual disturbance.  Respiratory: Negative for shortness of breath and wheezing.   Cardiovascular: Negative for chest pain and palpitations.  Gastrointestinal: Negative for diarrhea, blood in stool, abdominal distention or other pain Genitourinary: Negative for hematuria, flank pain or change in urine volume.  Musculoskeletal: Negative for myalgias or other joint complaints.  Skin: Negative for color change and wound.  Neurological: Negative for syncope and numbness. other than noted Hematological: Negative for adenopathy. or other swelling Psychiatric/Behavioral: Negative for hallucinations, self-injury, decreased concentration or other worsening agitation.      Objective:   Physical Exam BP 132/80  Pulse 70  Temp(Src) 98.2 F (36.8 C) (Oral)  Ht 5' 5"  (1.651 m)  Wt 166 lb (75.297 kg)  BMI 27.62 kg/m2  SpO2 97% VS noted,  Constitutional: Pt is oriented to person, place, and time. Appears well-developed and well-nourished.  Head: Normocephalic and atraumatic.  Right Ear: External ear normal.  Left Ear: External ear normal.  Nose: Nose normal.  Mouth/Throat: Oropharynx is clear and moist.  Eyes: Conjunctivae and EOM are normal. Pupils are equal, round, and reactive to light.  Neck: Normal range of motion. Neck supple. No JVD present. No tracheal deviation  present.  Cardiovascular: Normal rate, regular rhythm, normal heart sounds and intact distal pulses.   Pulmonary/Chest: Effort normal and breath sounds without rales or wheezing  Abdominal: Soft. Bowel sounds are normal. NT. No HSM  Musculoskeletal: Normal range of motion. Exhibits no edema.  Lymphadenopathy:  Has no cervical adenopathy.  Neurological: Pt is alert and oriented to person, place, and time. Pt has normal reflexes. No cranial nerve deficit. Motor grossly intact Skin: Skin is warm and dry. No rash noted.  Psychiatric:  Has normal mood and affect. Behavior is normal.  No activ synovitis, but has moderate typical RA hand changes    Assessment & Plan:

## 2014-01-03 ENCOUNTER — Telehealth: Payer: Self-pay | Admitting: *Deleted

## 2014-01-03 NOTE — Telephone Encounter (Signed)
Patient is to follow up with Dr. Linus Salmons on 10/1.  Per Judson Roch, there was some confusion on the patient's iv antibiotic stop date.  Per notes, IV abx were for 6 weeks to stop 9/24 or at next follow up.  Per patient, he was supposed to be on IV abx for 8 weeks.  Per [facility] nursing order, patient is on IV abx for 7 weeks through 10/8.   Patient will be continued on same therapy through his follow up this week unless otherwise ordered. Landis Gandy, RN

## 2014-01-03 NOTE — Telephone Encounter (Signed)
9/24 or until follow up.

## 2014-01-06 ENCOUNTER — Ambulatory Visit (INDEPENDENT_AMBULATORY_CARE_PROVIDER_SITE_OTHER): Payer: Medicare Other | Admitting: Internal Medicine

## 2014-01-06 ENCOUNTER — Encounter: Payer: Self-pay | Admitting: Internal Medicine

## 2014-01-06 ENCOUNTER — Telehealth: Payer: Self-pay | Admitting: *Deleted

## 2014-01-06 VITALS — BP 145/95 | HR 65 | Temp 97.4°F | Ht 66.0 in | Wt 161.0 lb

## 2014-01-06 DIAGNOSIS — T814XXD Infection following a procedure, subsequent encounter: Secondary | ICD-10-CM

## 2014-01-06 DIAGNOSIS — IMO0001 Reserved for inherently not codable concepts without codable children: Secondary | ICD-10-CM

## 2014-01-06 NOTE — Progress Notes (Signed)
   Subjective:    Patient ID: [name], male    DOB: 07/21/1945, 68 y.o.   MRN: [id number]  HPI [name] is a 68-year-old gentleman with rheumatoid arthritis and degenerative spine disease who underwent lumbar fusion last November. He developed postoperative wound infection and was readmitted in December. He underwent incision and drainage and operative cultures revealed Pseudomonas. He received 6 weeks of IV cefepime completing therapy on January 24. He developed a diffuse rash of the tailor his therapy that was felt to be an allergic reaction to cefepime. He was supposed to transition to oral ciprofloxacin but never made it back to our clinic. He has had multiple readmissions since that time for altered mental status and headache. Fluid was aspirated from the lumbar region in March and was negative on Gram stain and culture. He describes increasing headache and low back pain for the past 2-1/2 months. He's had intermittent fever and chills.   He came in 8/13 with fever, headache and MRI concerning for postoperative infection/meningitis/subdural empyema which had progressed from earlier scans.  CSF culture was sent and he was started on empiric antibiotics with vanocmycin and meropenem.  Cultures though remained negative during the hospitalization and he was discharged on vancomycin and cipro (Pseudomonas was sens) for 6 weeks.  He is now 2 weeks into his course.  No headache and he feels much better.  No rash, no diarrhea.  No further headaches.    Still doing well 10/1 and has finished treatment.  ESR 18.    Review of Systems  Constitutional: Negative for fever and fatigue.  Gastrointestinal: Negative for nausea, abdominal pain and diarrhea.  Genitourinary: Negative for decreased urine volume.  Skin: Negative for rash.  Neurological: Negative for dizziness and light-headedness.       Objective:   Physical Exam  Constitutional: He appears well-developed and well-nourished. No  distress.  Eyes: No scleral icterus.  Cardiovascular: Normal rate, regular rhythm and normal heart sounds.   No murmur heard. Skin: No rash noted.          Assessment & Plan:   

## 2014-01-06 NOTE — Assessment & Plan Note (Signed)
Did great, no indication for further antibiotics.  Will have PICC pulled and rtc PRN

## 2014-01-06 NOTE — Telephone Encounter (Signed)
Called verbal order to [place] at [place] for nursing to pull PICC. Landis Gandy, RN

## 2014-01-17 ENCOUNTER — Ambulatory Visit (INDEPENDENT_AMBULATORY_CARE_PROVIDER_SITE_OTHER): Payer: Medicare Other | Admitting: Internal Medicine

## 2014-01-17 ENCOUNTER — Encounter: Payer: Self-pay | Admitting: Internal Medicine

## 2014-01-17 VITALS — BP 154/100 | HR 76 | Temp 97.6°F | Wt 158.0 lb

## 2014-01-17 DIAGNOSIS — M869 Osteomyelitis, unspecified: Secondary | ICD-10-CM

## 2014-01-17 DIAGNOSIS — M4626 Osteomyelitis of vertebra, lumbar region: Secondary | ICD-10-CM

## 2014-01-17 DIAGNOSIS — G44209 Tension-type headache, unspecified, not intractable: Secondary | ICD-10-CM

## 2014-01-17 DIAGNOSIS — IMO0001 Reserved for inherently not codable concepts without codable children: Secondary | ICD-10-CM

## 2014-01-17 DIAGNOSIS — R42 Dizziness and giddiness: Secondary | ICD-10-CM

## 2014-01-17 DIAGNOSIS — T814XXD Infection following a procedure, subsequent encounter: Secondary | ICD-10-CM

## 2014-01-17 DIAGNOSIS — I10 Essential (primary) hypertension: Secondary | ICD-10-CM

## 2014-01-17 DIAGNOSIS — M069 Rheumatoid arthritis, unspecified: Secondary | ICD-10-CM

## 2014-01-17 DIAGNOSIS — G894 Chronic pain syndrome: Secondary | ICD-10-CM

## 2014-01-17 NOTE — Assessment & Plan Note (Signed)
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 Long term use of extended release pain medications. Continue with current prescription therapy as reflected on the Med list.

## 2014-01-17 NOTE — Assessment & Plan Note (Signed)
BP Readings from Last 3 Encounters:  01/17/14 154/100  01/06/14 145/95  12/31/13 132/80   Will watch

## 2014-01-17 NOTE — Assessment & Plan Note (Signed)
Predn 10 mg/d

## 2014-01-17 NOTE — Assessment & Plan Note (Signed)
IV Vanc for wound infection (!) and Cipro - 9/15

## 2014-01-17 NOTE — Assessment & Plan Note (Signed)
2014 Pseudomonas 8/15 relapsed

## 2014-01-17 NOTE — Assessment & Plan Note (Signed)
Better  

## 2014-01-17 NOTE — Progress Notes (Signed)
Pre visit review using our clinic review tool, if applicable. No additional management support is needed unless otherwise documented below in the visit note. 

## 2014-01-17 NOTE — Assessment & Plan Note (Signed)
3/15 Frontal-occiputal HA, crushing in nature. Migraines (?). HA's resolved after 1 week of IV Vanc for wound infection (!) - 9/15

## 2014-01-19 ENCOUNTER — Encounter: Payer: Self-pay | Admitting: Internal Medicine

## 2014-01-28 ENCOUNTER — Encounter: Payer: Self-pay | Admitting: Neurology

## 2014-01-28 ENCOUNTER — Ambulatory Visit (INDEPENDENT_AMBULATORY_CARE_PROVIDER_SITE_OTHER): Payer: Medicare Other | Admitting: Neurology

## 2014-01-28 VITALS — BP 138/82 | HR 58 | Ht 66.0 in | Wt 161.0 lb

## 2014-01-28 DIAGNOSIS — R519 Headache, unspecified: Secondary | ICD-10-CM

## 2014-01-28 DIAGNOSIS — R269 Unspecified abnormalities of gait and mobility: Secondary | ICD-10-CM

## 2014-01-28 DIAGNOSIS — A498 Other bacterial infections of unspecified site: Secondary | ICD-10-CM

## 2014-01-28 DIAGNOSIS — B965 Pseudomonas (aeruginosa) (mallei) (pseudomallei) as the cause of diseases classified elsewhere: Secondary | ICD-10-CM

## 2014-01-28 DIAGNOSIS — R51 Headache: Secondary | ICD-10-CM

## 2014-01-28 NOTE — Patient Instructions (Signed)
I will see you back routinely in 5-6 months.   Please remember, common headache triggers are: sleep deprivation, dehydration, overheating, stress, hypoglycemia or skipping meals and blood sugar fluctuations, excessive pain medications or excessive alcohol use or caffeine withdrawal. Some people have food triggers such as aged cheese, orange juice or chocolate, especially dark chocolate, or MSG (monosodium glutamate). Try to avoid these headache triggers as much possible. It may be helpful to keep a headache diary to figure out what makes your headaches worse or brings them on and what alleviates them. Some people report headache onset after exercise but studies have shown that regular exercise may actually prevent headaches from coming. If you have exercise-induced headaches, please make sure that you drink plenty of fluid before and after exercising and that you do not over do it and do not overheat.

## 2014-01-28 NOTE — Progress Notes (Signed)
Subjective:    Patient ID: [name] is a 68 y.o. male.  HPI    Interim history:   [name] is a 68 year old right-handed gentleman with an underlying complex medical history of hypertension, osteoarthritis, diverticulosis, hemachromatosis, coronary artery disease, allergic rhinitis, rheumatoid arthritis, and COPD, who presents for followup consultation of his gait disorder, and recurrent headaches. He is unaccompanied today. I last saw him on 09/20/2013, at which time he presented for a sooner than scheduled appointment for new onset headaches. I had seen him before for altered mental status and balance problems as well as ataxia. He reported at the last visit new onset headaches since March 2015. These are typically right-sided. He had tried Imitrex per primary care provider and had also been given IV Depacon which helped. He was seen in ENT. He had gone through speech therapy. In the interim, he was seen by our nurse practitioner, Jeani Hawking on 11/18/2013 for a sooner than scheduled appointment because of an acute severe headache. He was found to have a fever of 101.7 orally. We sent him to the emergency room in light of his fever, history of immune suppressant use, severe headache associated with nausea and also neck stiffness. He was treated for possible subdural empyema with IV antibiotics and discharged on oral antibiotics. MRI brain without contrast on 11/18/2013 showed no new findings and chronic changes. MRI lumbar spine with and without contrast on 11/18/2013 showed: Enhancing debris in the dependent portion of the thecal sac and upper sacral subdural space consistent with postoperative infection/meningitis/subdural empyema. This has significantly progressed from February 2015. Surgical consultation is warranted. Slight enhancement of the L3-4 disc space has progressed from priors, equivocal for diskitis.   Today, he reports doing rather well. His HAs resolved after the first week of  being on the Vancomycin. He never started the Enbrel and also is off of Humira, per rheumatologist. He feels well and is in good spirits. He is on Nucynta, one daily. He is very pleased with how he's doing since his last hospitalization.  I saw him on 07/26/2013, at which time we discussed his multiple recent hospitalizations. He reported problems with hoarseness and softer speech and I referred him to ENT. I also ordered a swallow study. This was done on 08/11/2013 and showed mild sensory base pharyngeal dysphagia, delayed swallow reflex, penetration and aspiration of thin and nectar thick liquids with no reflexive cough, compensatory positioning with chin talk was ineffective, however individual small boluses decreased penetration and aspiration. He was advised to followup with speech therapy. I made a referral in that regard. The patient called on 09/06/2013 reporting a two-week headache. Dr. Brett Fairy saw him on my behalf as I was not in the office that week, and he was treated in our office with Depacon infusion as well as IV Solu-Medrol, 500 mg each. He improved. Dr. Brett Fairy ordered a repeat brain MRI and he had a brain MRI with and without contrast on 09/18/2013: Abnormal MRI scan of the brain showing mild changes of chronic microvascular ischemia and generalized cerebral atrophy. Overall no significant change compared with previous MRI scan dated 06/30/2012. In addition, personally reviewed the images through the PACS system.  I saw him on 12/15/2012, at which time we talked about previous test results again. We talked about his hospitalization from April 2014. Overall he had improved at the time. Workup had shown increased CSF protein, increased IgG index, positive oligoclonal bands. This of course in the context of autoimmune disease, namely rheumatoid  arthritis, on Humira. EEG was negative. We looked for cancer with screening CT abdomen pelvis and chest x-ray. He has been on immune suppressant, Humira.  Brain MRI did not show any demyelinating process or mass or enhancing lesions. I considered a second opinion at a larger [facility] and a repeat brain MRI. His last CTH was on 06/30/13: Involutional and chronic changes without evidence of focal or acute abnormalities.  On 03/02/2013 he had lower spine surgery with decompression of the thecal sac and facetectomy, L2-3 discectomy, removal of herniated disc, placement of pedicle screws L1-3 and S1, fusion from L1-S1 involving the L4-5 fusion; this was under Dr. Joya Salm. Postoperatively he developed hypotension. He was discharged on 03/09/2013 to a skilled nursing facility. On 03/16/2013 he developed fever and vomiting and was readmitted under neurosurgery. He was found to have hypovolemia, hyponatremia and elevation in BUN and creatinine. On 03/17/2013 he had revision of a lumbar wound.  On 03/19/2013 he was seen by infectious disease for a lumbar wound infection and treatment thereof. He was discharged to home on 04/06/2013 after being treated for a wound infection with IV antibiotics. He presented on 04/13/2013 with fever and hypotension and was sent to the emergency room for further workup and treatment. ENT was consulted for hoarseness and GI was consulted for concern for small bowel obstruction which was treated conservatively. He was discharged on 04/24/2013. He has had multiple falls with and without injuries, most recently a laceration to his face on 07/01/2013. He has had [facility] physical therapy and speech therapy. He lives with his son currently. He is supposed to have 24 hour supervision and has not been fully compliant with the use of assistive devices as recommended by home health PT.  He was admitted on 05/06/2013 for AMS, deemed secondary to polypharmacy including opioid overdose, presenting with mental status changes, acute renal failure, acute respiratory failure with hypoxia. He was discharged on 05/09/2013.  He was admitted on 05/23/2013 with  generalized weakness, fever, nausea and vomiting. He was transferred to inpatient rehabilitation on 05/27/2013. He was discharged on 06/10/2013.  He was admitted on 06/16/2013 for altered mental status. He was discharged on 06/22/2013. He presented to the emergency room on 06/30/2013 for fever. He presented to the emergency room on 07/01/2013 for a fall.  I saw him on 10/02/2012 after a recent hospitalization.  I first met him on 07/14/2012 at the request of Dr. Ronnald Ramp. He has an underlying complex medical history including rheumatoid arthritis, osteoarthritis, COPD, atrial fibrillation, anemia, hypertension, heart disease, reported fairly sudden onset unsteadiness of his gait, vertigo, dizziness, blurry vision since early March 2014 there was no significant prodromal illness. His wife had noted some slurring of speech. Head CT on 06/22/2012 showed no acute abnormality. 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 He also reported some nausea for which meclizine and scopolamine patch helped. I arranged for a hospital admission from clinic because of concerns with cerebellar dysfunction including dysarthria, nystagmus, but also some alteration in his mental status. The patient was not fully oriented and was sluggish in his responses and seemed confused, talking out of context at times. He had mild dysmetria bilaterally.  CSF showed: increased protein, increased IgG index and positive oligoclonal bands in his CSF. His altered mental status improved and his exam was better and he benefited from physical therapy which he completed. I felt that he had a variety of differential diagnoses possible including cerebellitis, and cephalitis, metabolic-toxic  or other immune-paraneoplastic causes of his presentation. I recommended paraneoplastic testing. EEG was normal, chest x-ray was negative, CT abdomen and pelvis were negative. Of note, he has been on immune  suppression with Humira. MRI did not show any demyelinating lesions.  His paraneoplastic testing was negative in July 2014. He had foot surgery on the right. He has been on Humira for the past 2 years, Perrin injections every other week.   His Past Medical History Is Significant For: Past Medical History  Diagnosis Date  . Hx of colonic polyps   . Diverticulosis   . Hypertension   . 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 CAD (coronary artery disease)     minimal coronary plaque in the LAD and right coronary system. 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 7-06. Last nuclear stress 11-17-06 fixed anterior/ inferior defect, no inducible ischemia, EF 81%  . Allergic rhinitis   . Hx of colonoscopy   . Dysrhythmia 01-24-12    past hx. A.Fib x1 episode-responded to med.  . Depression   . GERD (gastroesophageal reflux disease)   . COPD (chronic obstructive pulmonary disease)     pt denies this hx on 05/24/2013  . Osteoarthritis   . RA (rheumatoid arthritis)   . Chronic back pain     "all over back" (06/16/2013)  . PONV (postoperative nausea and vomiting)   . High cholesterol   . Sleep apnea     "never dx'd; snores bad" (06/16/2013)  . Daily headache   . Falls frequently     "since 02/2013" (06/16/2013)  . Anxiety   . Narcotic abuse     per family  . Alcoholism /alcohol abuse     per family  . Dementia   . Bacterial infection     His Past Surgical History Is Significant For: Past Surgical History  Procedure Laterality Date  . Appendectomy    . Total knee arthroplasty Right 2002     partial  . Shoulder hemi-arthroplasty Left 2008    partial  . Foot surgery Right 11-08    for removal of bone spurs-  . Ankle reconstruction Right 6-09    [facility]  . Posterior lumbar fusion  12-10    L4-5 diskectomy w/ fusion, cage placement and rods; [facility]  . Partial knee arthroplasty Left    . Total shoulder replacement Right   . Joint replacement    . Cataract extraction, bilateral Bilateral 01-24-12  . Orif shoulder fracture  02/06/2012    Procedure: OPEN REDUCTION INTERNAL FIXATION (ORIF) SHOULDER FRACTURE;  Surgeon: Nita Sells, MD;  Location: [facility];  Service: Orthopedics;  Laterality: Left;  ORIF of a Left Shoulder Fracture with  Iliac Crest Bone Graft aspiration   . Harvest bone graft  02/06/2012    Procedure: HARVEST ILIAC BONE GRAFT;  Surgeon: Nita Sells, MD;  Location: [facility];  Service: Orthopedics;;  bone marrow aspirqation   . Hardware removal  03/09/2012    Procedure: HARDWARE REMOVAL;  Surgeon: Nita Sells, MD;  Location: [facility];  Service: Orthopedics;  Laterality: Left;  Hardware Removal from Left Shoulder  . Shoulder arthroscopy Right 2013  . Carpal tunnel release Right 1990's  . Total ankle replacement Right 2008    at [facility]  . Hammer toe surgery Right 07/2012    "broke 4 hammertoes"   . Posterior lumbar  fusion 4 level N/A 03/02/2013    Procedure: Lumbar One to Sacral One Posterior lumbar interbody fusion;  Surgeon: Floyce Stakes, MD;  Location: [facility];  Service: Neurosurgery;  Laterality: N/A;  L1 to S1 Posterior lumbar interbody fusion  . Lumbar wound debridement N/A 03/17/2013    Procedure: Incision and drainage of superficial lumbar wound;  Surgeon: Floyce Stakes, MD;  Location: [facility];  Service: Neurosurgery;  Laterality: N/A;  Incision and drainage of superficial lumbar wound  . Tonsillectomy    . Coronary angioplasty with stent placement  2006    "while repairing 1st stent, a second area tore and they had to place 2nd stent " ?LAD & CX  . Inguinal hernia repair Bilateral     His Family History Is Significant For: Family History  Problem Relation Age of Onset  . Uterine cancer Mother     survivor  . Macular degeneration Mother   . Other Mother     ankle edema  . Lung cancer  Mother   . Cancer Mother     ovarian, lung  . Coronary artery disease Father   . Hypertension Father   . Prostate cancer Father   . Colon polyps Father   . Cancer Father     prostate  . Thyroid disease Sister   . Heart attack Brother   . Hyperlipidemia Brother   . Other Brother     Schizophrenic  . Diabetes Neg Hx   . Colon cancer Neg Hx   . Esophageal cancer Neg Hx   . Rectal cancer Neg Hx   . Stomach cancer Neg Hx   . Coronary artery disease Maternal Aunt   . Heart attack Maternal Aunt   . Rheum arthritis Sister   . Hemochromatosis Sister     His Social History Is Significant For: History   Social History  . Marital Status: Divorced    Spouse Name: N/A    Number of Children: 2  . Years of Education: 16   Occupational History  . 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 Smoking status: Never Smoker   . Smokeless tobacco: Never Used  . Alcohol Use: No     Comment: 06/16/2013 "used to drink alot; was pretty bad about it; don't think he's had any alcohol in the past year"  . Drug Use: No  . Sexual Activity: Yes    Partners: Female   Other Topics Concern  . None   Social History Narrative   [employer] - BA. Married 1972-65yr seperated/divorced. Engaged April '11. 1 son '74; step-daughter '70. 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 During '11 discovered an [employer]- $500,000+ loss. Is handling this OK - will keep the business running.    His Allergies Are:  Allergies  Allergen Reactions  . Morphine And Related Shortness Of Breath, Nausea And Vomiting, Swelling and Other (See Comments)    Agitation, tolerates dilaudid  . Penicillins Hives, Shortness Of Breath and Rash    REACTION: hives, breathing problems  . Cefepime   . Percocet [Oxycodone-Acetaminophen] Anxiety    Takes OxyContin at home.  :   His Current  Medications Are:  Outpatient Encounter Prescriptions as of 01/28/2014  Medication Sig  . albuterol (PROVENTIL HFA;VENTOLIN HFA) 108 (90 BASE) MCG/ACT inhaler Inhale 2 puffs into the lungs every 6 (six)  hours as needed for wheezing or shortness of breath.  . ALPRAZolam (XANAX) 0.5 MG tablet Take 1 tablet by mouth daily as needed for anxiety.   [name] aspirin (ASPIRIN EC) 81 MG EC tablet Take 1 tablet (81 mg total) by mouth every morning.  . cholecalciferol (VITAMIN D) 1000 UNITS tablet Take 1 tablet (1,000 Units total) by mouth daily.  [name] FLUoxetine (PROZAC) 20 MG capsule Take 20 mg by mouth daily.   . fluticasone (FLONASE) 50 MCG/ACT nasal spray Place 2 sprays into both nostrils daily.  [name] gabapentin (NEURONTIN) 300 MG capsule TAKE 2 CAPSULES BY MOUTH TWICE A DAY  . losartan-hydrochlorothiazide (HYZAAR) 50-12.5 MG per tablet Take 1 tablet by mouth daily.  . methocarbamol (ROBAXIN) 500 MG tablet Take 1,000 mg by mouth at bedtime.   . metoprolol tartrate (LOPRESSOR) 25 MG tablet Take 1 tablet (25 mg total) by mouth 2 (two) times daily.  . ondansetron (ZOFRAN) 4 MG tablet Take 1 tablet (4 mg total) by mouth every 8 (eight) hours as needed for nausea or vomiting.  . pantoprazole (PROTONIX) 20 MG tablet Take 2 tablets (40 mg total) by mouth 2 (two) times daily.  . potassium chloride SA (KLOR-CON M20) 20 MEQ tablet Take 1 tablet (20 mEq total) by mouth 2 (two) times daily.  . predniSONE (DELTASONE) 10 MG tablet Take 10 mg by mouth daily.  . simvastatin (ZOCOR) 20 MG tablet Take 1 tablet (20 mg total) by mouth at bedtime.  . [DISCONTINUED] divalproex (DEPAKOTE ER) 500 MG 24 hr tablet Take 1 tablet (500 mg total) by mouth at bedtime.  . [DISCONTINUED] rizatriptan (MAXALT-MLT) 10 MG disintegrating tablet Take 1 tablet (10 mg total) by mouth as needed for migraine. May repeat in 2 hours if needed  :  Review of Systems:  Out of a complete 14 point review of systems, all are reviewed and negative with the  exception of these symptoms as listed below:   Review of Systems  Musculoskeletal: Positive for back pain, gait problem, joint swelling, neck pain and neck stiffness.       Joint pain  Allergic/Immunologic:       Frequent infections    Objective:  Neurologic Exam  Physical Exam Physical Examination:   Filed Vitals:   01/28/14 1157  BP: 138/82  Pulse: 58   General Examination: The patient is a very pleasant 68 y.o. male in no acute distress. He looks well and is in great spirits today.  HEENT: Normocephalic, atraumatic, pupils are equal, round and reactive to light and accommodation. Funduscopic exam is normal with sharp disc margins noted. He is status post bilateral cataract repairs. Extraocular tracking is good without nystagmus seen. Hearing is grossly intact. Face is symmetric with normal facial animation and normal facial sensation. Speech is better today. Slight hoarseness is noted. There is no lip, neck or jaw tremor. Neck is supple with full range of motion. There are no carotid bruits on auscultation. Oropharynx exam reveals moderate mouth dryness. No significant airway crowding is noted. Mallampati is class II. Tongue protrudes centrally and palate elevates symmetrically. Tongue movements are fine.   Chest: is clear to auscultation without wheezing, rhonchi or crackles noted.  Heart: sounds are regular and normal without murmurs, rubs or gallops noted.   Abdomen: is soft, non-tender and non-distended with normal bowel sounds appreciated on auscultation.  Extremities: There is no pitting edema in the distal lower extremities bilaterally, mostly around the ankles, right more so than left and ankle joint is swollen  on the right, fused, not new. Pedal pulses are intact. Arthritic changes are noted in both arms, left shoulder, ankles, both knees have scars.  Skin: is warm and dry with no trophic changes noted.  Musculoskeletal: exam reveals R hip pain and decreasing range of  motion in both shoulders, left worse than right.    Neurologically:  Mental status: The patient is awake, paying attention. He is fully oriented, and able to give a fairly good history. His memory, attention, language and knowledge are fairly well preserved, but he needs help with chronological relationship history by his family. He does seem to have some mild word finding difficulties.    Cranial nerves are as described above under HEENT exam. Motor exam: Normal bulk, and strength is globally 5-/5. Tone seems normal. There is no drift, or tremor, no rebound. Reflexes are 1+ throughout. Fine motor skills are mildly impaired, bilaterally. On finger to nose testing he has very mild dysmetria bilaterally, right more than left with left almost normal. He has no significant intention tremor. There is no truncal ataxia.  Sensory exam is intact to light touch. Gait, station and balance: He stands up with mild difficulty and only requires one attempt. His stance is mildly wide-based.  He walks without assistance and slightly wide-based. He is unable to do tandem walk.   Assessment and Plan:   In summary, [name] is a very pleasant 68 year old male with a complex medical history of hypertension, osteoarthritis, diverticulosis, hemachromatosis, coronary artery disease, allergic rhinitis, rheumatoid arthritis, and COPD, who presents for a followup appointment for his recurrent headaches and gait disorder. I've seen him several times so far. Today he looks better than I have seen him recently. He is in good spirits and has had resolution of his headaches. He has been treated for a possible subdural empyema and feels that when he was treated with IV antibiotics he started much better quickly. His exam is stable and possibly better from last time. His history is quite complicated. He has had multiple imaging tests and ends in the last year. He had altered mental status which improved. Unfortunately he had  significant issues in the interim with complications from lower back surgery including wound infection and a protracted course clinically. Paraneoplastic testing and screening for cancer in the past was negative, EEG normal, negative chest x-ray and negative CT abdomen and pelvis for any mass. He has been taken off of his immune suppressant and there is no plan to put him on Enbrel in the future as I understand. He is advised to followup with me routinely in the next 4-6 months. He is encouraged to call with any interim questions. He and I are both pleased with how he is doing.

## 2014-02-07 ENCOUNTER — Ambulatory Visit: Payer: Medicare Other | Admitting: Internal Medicine

## 2014-03-11 ENCOUNTER — Other Ambulatory Visit: Payer: Self-pay | Admitting: Internal Medicine

## 2014-03-11 ENCOUNTER — Encounter: Payer: Self-pay | Admitting: Internal Medicine

## 2014-03-11 ENCOUNTER — Ambulatory Visit (INDEPENDENT_AMBULATORY_CARE_PROVIDER_SITE_OTHER): Payer: Medicare Other | Admitting: Internal Medicine

## 2014-03-11 ENCOUNTER — Other Ambulatory Visit (INDEPENDENT_AMBULATORY_CARE_PROVIDER_SITE_OTHER): Payer: Medicare Other

## 2014-03-11 VITALS — BP 150/88 | Temp 98.3°F | Resp 65 | Wt 163.0 lb

## 2014-03-11 DIAGNOSIS — I2510 Atherosclerotic heart disease of native coronary artery without angina pectoris: Secondary | ICD-10-CM

## 2014-03-11 DIAGNOSIS — E876 Hypokalemia: Secondary | ICD-10-CM

## 2014-03-11 DIAGNOSIS — I10 Essential (primary) hypertension: Secondary | ICD-10-CM

## 2014-03-11 DIAGNOSIS — G44209 Tension-type headache, unspecified, not intractable: Secondary | ICD-10-CM

## 2014-03-11 NOTE — Progress Notes (Signed)
Subjective:    HPI  C/o recent high potassium reading   F/u fatigue, sleeping a lot - better since he stopped am Gabapentin and Robaxin. In PT for LBP now On weekly Humira since 11/15  He saw Dr Melton Alar, Dr Verdie Mosher Readings from Last 3 Encounters:  03/11/14 163 lb (73.936 kg)  01/28/14 161 lb (73.029 kg)  01/17/14 158 lb (71.668 kg)     BP Readings from Last 3 Encounters:  03/11/14 150/88  01/28/14 138/82  01/17/14 154/100   (Pt's son left vm 7/3  stating pt is dizzy, can't walk or stand with unsteady gait. He states he sleeps all the time. [name] thinks some of his meds are affecting him and he doesn't need to be on all of them)  S/p recent LS surgery, sepsis, rehab - weak muscles....  [name] has had a very difficult time with multiple medical problems following back surgery - all well documented in the hospital record and previous notes. Since his last hospital d/c he has been living with his son. By their report he is off narcotics!! He is still having quite a bit of pain but is managing. He has lost a lot of weight but is currently on a good diet plus calorie supplement. He does report persistent Headache in a frontal-vertex/occipital distribution x 1 year.                          Review of Systems  Constitutional: Negative for appetite change, fatigue and unexpected weight change.  HENT: Positive for voice change. Negative for congestion, nosebleeds, sneezing, sore throat and trouble swallowing.   Eyes: Negative for itching and visual disturbance.  Respiratory: Negative for cough.   Cardiovascular: Negative for chest pain, palpitations and leg swelling.  Gastrointestinal: Negative for nausea, vomiting, diarrhea, blood in stool and abdominal distention.  Genitourinary: Negative for frequency and hematuria.  Musculoskeletal: Positive for back pain, arthralgias, gait problem, neck pain and neck stiffness. Negative for joint swelling.  Skin: Negative  for rash and wound.  Neurological: Negative for dizziness, tremors, speech difficulty, weakness, light-headedness and headaches.  Psychiatric/Behavioral: Positive for sleep disturbance. Negative for suicidal ideas, dysphoric mood and agitation. The patient is not nervous/anxious.        Objective:   Physical Exam  Constitutional: He is oriented to person, place, and time. He appears well-developed. No distress.  NAD  HENT:  Mouth/Throat: Oropharynx is clear and moist.  Eyes: Conjunctivae are normal. Pupils are equal, round, and reactive to light.  Neck: Normal range of motion. No JVD present. No thyromegaly present.  Cardiovascular: Normal rate, regular rhythm, normal heart sounds and intact distal pulses.  Exam reveals no gallop and no friction rub.   No murmur heard. Pulmonary/Chest: Effort normal and breath sounds normal. No respiratory distress. He has no wheezes. He has no rales. He exhibits no tenderness.  Abdominal: Soft. Bowel sounds are normal. He exhibits no distension and no mass. There is no tenderness. There is no rebound and no guarding.  Musculoskeletal: Normal range of motion. He exhibits no edema or tenderness.  Lymphadenopathy:    He has no cervical adenopathy.  Neurological: He is alert and oriented to person, place, and time. He has normal reflexes. No cranial nerve deficit. He exhibits normal muscle tone. He displays a negative Romberg sign. Coordination and gait normal.  No meningeal signs  Skin: Skin is warm and dry. No rash noted.  Psychiatric:  He has a normal mood and affect. His behavior is normal. Judgment and thought content normal.  Looks well Chronic RA deformities   Lab Results  Component Value Date   WBC 7.4 12/31/2013   HGB 11.0* 12/31/2013   HCT 32.9* 12/31/2013   PLT 261.0 12/31/2013   GLUCOSE 122* 12/31/2013   CHOL 139 12/31/2013   TRIG 44.0 12/31/2013   HDL 41.60 12/31/2013   LDLCALC 89 12/31/2013   ALT 18 12/31/2013   AST 16 12/31/2013    NA 135 12/31/2013   K 4.5 12/31/2013   CL 104 12/31/2013   CREATININE 1.6* 12/31/2013   BUN 42* 12/31/2013   CO2 25 12/31/2013   TSH 1.79 12/31/2013   PSA 1.95 10/15/2010   INR 1.26 06/08/2013   HGBA1C 6.0 12/31/2013        Assessment & Plan:

## 2014-03-11 NOTE — Assessment & Plan Note (Signed)
Better - Robaxin prn

## 2014-03-11 NOTE — Assessment & Plan Note (Signed)
Chronic - on replacement On Hyzaar and KCl  11/15 - elev potassium at [facility] office - will re-check - pt is taking 1 a day KCl now

## 2014-03-11 NOTE — Progress Notes (Signed)
Pre visit review using our clinic review tool, if applicable. No additional management support is needed unless otherwise documented below in the visit note. 

## 2014-03-11 NOTE — Assessment & Plan Note (Signed)
Continue with current prescription therapy as reflected on the Med list.  

## 2014-03-13 LAB — BASIC METABOLIC PANEL
BUN: 29 mg/dL — ABNORMAL HIGH (ref 6–23)
CO2: 26 mEq/L (ref 19–32)
Calcium: 8.8 mg/dL (ref 8.4–10.5)
Chloride: 100 mEq/L (ref 96–112)
Creatinine, Ser: 1 mg/dL (ref 0.4–1.5)
GFR: 79.24 mL/min (ref >60.00)
Glucose, Bld: 75 mg/dL (ref 70–99)
Potassium: 4.5 mEq/L (ref 3.5–5.1)
Sodium: 135 mEq/L (ref 135–145)

## 2014-03-13 LAB — MAGNESIUM: Magnesium: 2.1 mg/dL (ref 1.5–2.5)

## 2014-03-13 NOTE — Assessment & Plan Note (Signed)
Continue with current prescription therapy as reflected on the Med list.  

## 2014-03-25 ENCOUNTER — Other Ambulatory Visit: Payer: Self-pay | Admitting: *Deleted

## 2014-04-05 ENCOUNTER — Other Ambulatory Visit: Payer: Self-pay | Admitting: Nurse Practitioner

## 2014-04-05 ENCOUNTER — Encounter: Payer: Self-pay | Admitting: Internal Medicine

## 2014-04-05 NOTE — Telephone Encounter (Signed)
Last ordered by Jeani Hawking in Aug

## 2014-04-28 ENCOUNTER — Encounter: Payer: Self-pay | Admitting: Internal Medicine

## 2014-04-28 ENCOUNTER — Ambulatory Visit (INDEPENDENT_AMBULATORY_CARE_PROVIDER_SITE_OTHER)
Admission: RE | Admit: 2014-04-28 | Discharge: 2014-04-28 | Disposition: A | Discharge status: Home / Self Care | Payer: Medicare Other | Source: Ambulatory Visit | Attending: Internal Medicine | Admitting: Internal Medicine

## 2014-04-28 ENCOUNTER — Ambulatory Visit (INDEPENDENT_AMBULATORY_CARE_PROVIDER_SITE_OTHER): Payer: Medicare Other | Admitting: Internal Medicine

## 2014-04-28 ENCOUNTER — Other Ambulatory Visit: Payer: Self-pay | Admitting: Internal Medicine

## 2014-04-28 ENCOUNTER — Other Ambulatory Visit (INDEPENDENT_AMBULATORY_CARE_PROVIDER_SITE_OTHER): Payer: Medicare Other

## 2014-04-28 ENCOUNTER — Other Ambulatory Visit: Payer: Self-pay | Admitting: *Deleted

## 2014-04-28 VITALS — BP 140/90 | HR 65 | Temp 97.8°F | Wt 169.0 lb

## 2014-04-28 DIAGNOSIS — R0602 Shortness of breath: Secondary | ICD-10-CM

## 2014-04-28 DIAGNOSIS — J441 Chronic obstructive pulmonary disease with (acute) exacerbation: Secondary | ICD-10-CM

## 2014-04-28 DIAGNOSIS — I10 Essential (primary) hypertension: Secondary | ICD-10-CM

## 2014-04-28 DIAGNOSIS — R0609 Other forms of dyspnea: Secondary | ICD-10-CM

## 2014-04-28 DIAGNOSIS — R0600 Dyspnea, unspecified: Secondary | ICD-10-CM

## 2014-04-28 DIAGNOSIS — J449 Chronic obstructive pulmonary disease, unspecified: Secondary | ICD-10-CM

## 2014-04-28 LAB — CBC WITH DIFFERENTIAL/PLATELET
Basophils Absolute: 0 10*3/uL (ref 0.0–0.1)
Basophils Relative: 0.4 % (ref 0.0–3.0)
Eosinophils Absolute: 0.1 10*3/uL (ref 0.0–0.7)
Eosinophils Relative: 1.5 % (ref 0.0–5.0)
HCT: 39.5 % (ref 39.0–52.0)
Hemoglobin: 13.6 g/dL (ref 13.0–17.0)
Lymphocytes Relative: 14.9 % (ref 12.0–46.0)
Lymphs Abs: 1.5 10*3/uL (ref 0.7–4.0)
MCHC: 34.4 g/dL (ref 30.0–36.0)
MCV: 92.8 fl (ref 78.0–100.0)
Monocytes Absolute: 1 10*3/uL (ref 0.1–1.0)
Monocytes Relative: 9.7 % (ref 3.0–12.0)
Neutro Abs: 7.3 10*3/uL (ref 1.4–7.7)
Neutrophils Relative %: 73.5 % (ref 43.0–77.0)
Platelets: 244 10*3/uL (ref 150.0–400.0)
RBC: 4.26 Mil/uL (ref 4.22–5.81)
RDW: 13.7 % (ref 11.5–15.5)
WBC: 9.9 10*3/uL (ref 4.0–10.5)

## 2014-04-28 LAB — BASIC METABOLIC PANEL
BUN: 26 mg/dL — ABNORMAL HIGH (ref 6–23)
CO2: 28 mEq/L (ref 19–32)
Calcium: 9.1 mg/dL (ref 8.4–10.5)
Chloride: 103 mEq/L (ref 96–112)
Creatinine, Ser: 1.07 mg/dL (ref 0.40–1.50)
GFR: 73.26 mL/min (ref >60.00)
Glucose, Bld: 102 mg/dL — ABNORMAL HIGH (ref 70–99)
Potassium: 4.4 mEq/L (ref 3.5–5.1)
Sodium: 137 mEq/L (ref 135–145)

## 2014-04-28 LAB — CK: Total CK: 68 U/L (ref 7–232)

## 2014-04-28 LAB — TSH: TSH: 0.97 u[IU]/mL (ref 0.35–4.50)

## 2014-04-28 MED ORDER — FLUTICASONE FUROATE-VILANTEROL 100-25 MCG/INH IN AEPB: 1.0000 | INHALATION_SPRAY | Freq: Every day | RESPIRATORY_TRACT | Status: DC

## 2014-04-28 MED ORDER — NITROGLYCERIN 0.4 MG SL SUBL: 0.4000 mg | SUBLINGUAL_TABLET | SUBLINGUAL | Status: DC | PRN

## 2014-04-28 MED ORDER — ALPRAZOLAM 0.5 MG PO TABS: 0.5000 mg | ORAL_TABLET | Freq: Two times a day (BID) | ORAL | Status: DC | PRN

## 2014-04-28 NOTE — Assessment & Plan Note (Signed)
BP Readings from Last 3 Encounters:  04/28/14 190/110  03/11/14 150/88  01/28/14 138/82

## 2014-04-28 NOTE — Progress Notes (Signed)
Subjective:    HPI  C/o SOB at nigh when laying on his back flat since Sat. He had exertional sx's too after 5 min walking x several days. No CP. BP re-checked 140/90  F/u fatigue, sleeping a lot - better since he stopped am Gabapentin and Robaxin. In PT for LBP now On weekly Humira since 11/15  He saw Dr Melton Alar, Dr Verdie Mosher Readings from Last 3 Encounters:  04/28/14 169 lb (76.658 kg)  03/11/14 163 lb (73.936 kg)  01/28/14 161 lb (73.029 kg)     BP Readings from Last 3 Encounters:  04/28/14 190/110  03/11/14 150/88  01/28/14 138/82     S/p recent LS surgery, sepsis, rehab - weak muscles....  [name] has had a very difficult time with multiple medical problems following back surgery - all well documented in the hospital record and previous notes. Since his last hospital d/c he has been living with his son. By their report he is off narcotics!! He is still having quite a bit of pain but is managing. He has lost a lot of weight but is currently on a good diet plus calorie supplement. He does report persistent Headache in a frontal-vertex/occipital distribution x 1 year.                          Review of Systems  Constitutional: Negative for appetite change, fatigue and unexpected weight change.  HENT: Positive for voice change. Negative for congestion, nosebleeds, sneezing, sore throat and trouble swallowing.   Eyes: Negative for itching and visual disturbance.  Respiratory: Negative for cough.   Cardiovascular: Negative for chest pain, palpitations and leg swelling.  Gastrointestinal: Negative for nausea, vomiting, diarrhea, blood in stool and abdominal distention.  Genitourinary: Negative for frequency and hematuria.  Musculoskeletal: Positive for back pain, arthralgias, gait problem, neck pain and neck stiffness. Negative for joint swelling.  Skin: Negative for rash and wound.  Neurological: Negative for dizziness, tremors, speech difficulty,  weakness, light-headedness and headaches.  Psychiatric/Behavioral: Positive for sleep disturbance. Negative for suicidal ideas, dysphoric mood and agitation. The patient is not nervous/anxious.        Objective:   Physical Exam  Constitutional: He is oriented to person, place, and time. He appears well-developed. No distress.  NAD  HENT:  Mouth/Throat: Oropharynx is clear and moist.  Eyes: Conjunctivae are normal. Pupils are equal, round, and reactive to light.  Neck: Normal range of motion. No JVD present. No thyromegaly present.  Cardiovascular: Normal rate, regular rhythm, normal heart sounds and intact distal pulses.  Exam reveals no gallop and no friction rub.   No murmur heard. Pulmonary/Chest: Effort normal and breath sounds normal. No respiratory distress. He has no wheezes. He has no rales. He exhibits no tenderness.  Abdominal: Soft. Bowel sounds are normal. He exhibits no distension and no mass. There is no tenderness. There is no rebound and no guarding.  Musculoskeletal: Normal range of motion. He exhibits no edema or tenderness.  Lymphadenopathy:    He has no cervical adenopathy.  Neurological: He is alert and oriented to person, place, and time. He has normal reflexes. No cranial nerve deficit. He exhibits normal muscle tone. He displays a negative Romberg sign. Coordination and gait normal.  No meningeal signs  Skin: Skin is warm and dry. No rash noted.  Psychiatric: He has a normal mood and affect. His behavior is normal. Judgment and thought content normal.  Looks  well Chronic RA deformities   Lab Results  Component Value Date   WBC 7.4 12/31/2013   HGB 11.0* 12/31/2013   HCT 32.9* 12/31/2013   PLT 261.0 12/31/2013   GLUCOSE 75 03/11/2014   CHOL 139 12/31/2013   TRIG 44.0 12/31/2013   HDL 41.60 12/31/2013   LDLCALC 89 12/31/2013   ALT 18 12/31/2013   AST 16 12/31/2013   NA 135 03/11/2014   K 4.5 03/11/2014   CL 100 03/11/2014   CREATININE 1.0 03/11/2014    BUN 29* 03/11/2014   CO2 26 03/11/2014   TSH 1.79 12/31/2013   PSA 1.95 10/15/2010   INR 1.26 06/08/2013   HGBA1C 6.0 12/31/2013   EKG  I personally provided Breo inhaler use teaching. After the teaching patient was able to demonstrate it's use effectively. All questions were answered      Assessment & Plan:

## 2014-04-28 NOTE — Progress Notes (Signed)
Walking test:  At rest room air 97%  After Lap 1:  94%         Lap 2:  91%         Lap 3:  98%

## 2014-04-28 NOTE — Assessment & Plan Note (Addendum)
?  etiology pulm vs cardiac CXR, EKG, labs Pulse oxym Stress test Trial of Breo Ellipta

## 2014-04-28 NOTE — Assessment & Plan Note (Addendum)
Treatment reviewed - see Rx. Use Proair prn Start Breo qd

## 2014-04-28 NOTE — Patient Instructions (Signed)
Go to ER if worse 

## 2014-05-02 ENCOUNTER — Ambulatory Visit: Payer: Self-pay | Admitting: Internal Medicine

## 2014-05-04 ENCOUNTER — Telehealth: Payer: Self-pay | Admitting: Internal Medicine

## 2014-05-04 ENCOUNTER — Other Ambulatory Visit: Payer: Self-pay | Admitting: Internal Medicine

## 2014-05-04 DIAGNOSIS — I480 Paroxysmal atrial fibrillation: Secondary | ICD-10-CM

## 2014-05-04 MED ORDER — FLUTICASONE PROPIONATE 50 MCG/ACT NA SUSP: 2.0000 | Freq: Every day | NASAL | Status: DC

## 2014-05-04 NOTE — Telephone Encounter (Signed)
Refills for flonase sent to [facility]. Pls advise on referral../lmb

## 2014-05-04 NOTE — Telephone Encounter (Signed)
Pt called in for 2 things  1)  Refill on Nose spray  2) Referral to: Dr. Collier Salina C. Johnsie Cancel, MD   Cardiologist  Address: [address] [place], [place] [address]  [phone]

## 2014-05-04 NOTE — Telephone Encounter (Signed)
Pt has been made aware...[name]

## 2014-05-04 NOTE — Telephone Encounter (Signed)
Ok both Thx 

## 2014-05-05 ENCOUNTER — Ambulatory Visit: Payer: Self-pay | Admitting: Internal Medicine

## 2014-05-13 ENCOUNTER — Encounter: Payer: Self-pay | Admitting: Internal Medicine

## 2014-05-13 ENCOUNTER — Ambulatory Visit (INDEPENDENT_AMBULATORY_CARE_PROVIDER_SITE_OTHER): Payer: Medicare Other | Admitting: Internal Medicine

## 2014-05-13 VITALS — BP 130/84 | HR 74 | Temp 98.2°F | Wt 171.0 lb

## 2014-05-13 DIAGNOSIS — I2510 Atherosclerotic heart disease of native coronary artery without angina pectoris: Secondary | ICD-10-CM

## 2014-05-13 DIAGNOSIS — R0609 Other forms of dyspnea: Secondary | ICD-10-CM

## 2014-05-13 DIAGNOSIS — R0600 Dyspnea, unspecified: Secondary | ICD-10-CM

## 2014-05-13 DIAGNOSIS — J449 Chronic obstructive pulmonary disease, unspecified: Secondary | ICD-10-CM

## 2014-05-13 MED ORDER — METHYLPREDNISOLONE ACETATE 80 MG/ML IJ SUSP
80.0000 mg | Freq: Once | INTRAMUSCULAR | Status: AC
  Administered 2014-05-13: 80 mg via INTRAMUSCULAR

## 2014-05-13 NOTE — Progress Notes (Signed)
Pre visit review using our clinic review tool, if applicable. No additional management support is needed unless otherwise documented below in the visit note. 

## 2014-05-13 NOTE — Progress Notes (Signed)
Subjective:    HPI  F/u SOB - better on Breo a little F/u recent high potassium reading   F/u fatigue, sleeping a lot - better since he stopped am Gabapentin and Robaxin. In PT for LBP now On weekly Humira since 11/15  He saw Dr [name], Dr [name] Readings from Last 3 Encounters:  05/13/14 171 lb (77.565 kg)  04/28/14 169 lb (76.658 kg)  03/11/14 163 lb (73.936 kg)     BP Readings from Last 3 Encounters:  05/13/14 130/84  04/28/14 140/90  03/11/14 150/88   (Pt's son left vm 7/3  stating pt is dizzy, can't walk or stand with unsteady gait. He states he sleeps all the time. [name] thinks some of his meds are affecting him and he doesn't need to be on all of them)  S/p recent LS surgery, sepsis, rehab - weak muscles....  [name] has had a very difficult time with multiple medical problems following back surgery - all well documented in the hospital record and previous notes. Since his last hospital d/c he has been living with his son. By their report he is off narcotics!! He is still having quite a bit of pain but is managing. He has lost a lot of weight but is currently on a good diet plus calorie supplement. He does report persistent Headache in a frontal-vertex/occipital distribution x 1 year.                          Review of Systems  Constitutional: Negative for appetite change, fatigue and unexpected weight change.  HENT: Positive for voice change. Negative for congestion, nosebleeds, sneezing, sore throat and trouble swallowing.   Eyes: Negative for itching and visual disturbance.  Respiratory: Negative for cough.   Cardiovascular: Negative for chest pain, palpitations and leg swelling.  Gastrointestinal: Negative for nausea, vomiting, diarrhea, blood in stool and abdominal distention.  Genitourinary: Negative for frequency and hematuria.  Musculoskeletal: Positive for back pain, arthralgias, gait problem, neck pain and neck stiffness. Negative for  joint swelling.  Skin: Negative for rash and wound.  Neurological: Negative for dizziness, tremors, speech difficulty, weakness, light-headedness and headaches.  Psychiatric/Behavioral: Positive for sleep disturbance. Negative for suicidal ideas, dysphoric mood and agitation. The patient is not nervous/anxious.        Objective:   Physical Exam  Constitutional: He is oriented to person, place, and time. He appears well-developed. No distress.  NAD  HENT:  Mouth/Throat: Oropharynx is clear and moist.  Eyes: Conjunctivae are normal. Pupils are equal, round, and reactive to light.  Neck: Normal range of motion. No JVD present. No thyromegaly present.  Cardiovascular: Normal rate, regular rhythm, normal heart sounds and intact distal pulses.  Exam reveals no gallop and no friction rub.   No murmur heard. Pulmonary/Chest: Effort normal and breath sounds normal. No respiratory distress. He has no wheezes. He has no rales. He exhibits no tenderness.  Abdominal: Soft. Bowel sounds are normal. He exhibits no distension and no mass. There is no tenderness. There is no rebound and no guarding.  Musculoskeletal: Normal range of motion. He exhibits no edema or tenderness.  Lymphadenopathy:    He has no cervical adenopathy.  Neurological: He is alert and oriented to person, place, and time. He has normal reflexes. No cranial nerve deficit. He exhibits normal muscle tone. He displays a negative Romberg sign. Coordination and gait normal.  No meningeal signs  Skin: Skin is  warm and dry. No rash noted.  Psychiatric: He has a normal mood and affect. His behavior is normal. Judgment and thought content normal.  Looks well Chronic RA deformities   Lab Results  Component Value Date   WBC 9.9 04/28/2014   HGB 13.6 04/28/2014   HCT 39.5 04/28/2014   PLT 244.0 04/28/2014   GLUCOSE 102* 04/28/2014   CHOL 139 12/31/2013   TRIG 44.0 12/31/2013   HDL 41.60 12/31/2013   LDLCALC 89 12/31/2013   ALT 18  12/31/2013   AST 16 12/31/2013   NA 137 04/28/2014   K 4.4 04/28/2014   CL 103 04/28/2014   CREATININE 1.07 04/28/2014   BUN 26* 04/28/2014   CO2 28 04/28/2014   TSH 0.97 04/28/2014   PSA 1.95 10/15/2010   INR 1.26 06/08/2013   HGBA1C 6.0 12/31/2013        Assessment & Plan:  Patient ID: [name], male   DOB: Sep 09, 1945, 69 y.o.   MRN: [id number]

## 2014-05-13 NOTE — Assessment & Plan Note (Signed)
Depo-medrol 80 mg IM Cont w/Breo

## 2014-05-13 NOTE — Assessment & Plan Note (Signed)
1/16 ?etiology: CAD, COPD, RA lung disease etc Depomedrol 80 mg a day May need to go back on 10 mg Prednisone a day CL stress test Card ref

## 2014-05-13 NOTE — Assessment & Plan Note (Signed)
CL stress test is being scheduled Card f/u

## 2014-05-19 ENCOUNTER — Ambulatory Visit (HOSPITAL_COMMUNITY): Payer: Medicare Other | Attending: Cardiology | Admitting: Radiology

## 2014-05-19 DIAGNOSIS — R0602 Shortness of breath: Secondary | ICD-10-CM | POA: Diagnosis present

## 2014-05-19 DIAGNOSIS — R0609 Other forms of dyspnea: Secondary | ICD-10-CM | POA: Insufficient documentation

## 2014-05-19 DIAGNOSIS — R0600 Dyspnea, unspecified: Secondary | ICD-10-CM

## 2014-05-19 DIAGNOSIS — I2510 Atherosclerotic heart disease of native coronary artery without angina pectoris: Secondary | ICD-10-CM

## 2014-05-19 MED ORDER — TECHNETIUM TC 99M SESTAMIBI GENERIC - CARDIOLITE
33.0000 | Freq: Once | INTRAVENOUS | Status: AC | PRN
  Administered 2014-05-19: 33 via INTRAVENOUS

## 2014-05-19 MED ORDER — REGADENOSON 0.4 MG/5ML IV SOLN
0.4000 mg | Freq: Once | INTRAVENOUS | Status: AC
  Administered 2014-05-19: 0.4 mg via INTRAVENOUS

## 2014-05-19 MED ORDER — TECHNETIUM TC 99M SESTAMIBI GENERIC - CARDIOLITE
11.0000 | Freq: Once | INTRAVENOUS | Status: AC | PRN
  Administered 2014-05-19: 11 via INTRAVENOUS

## 2014-05-19 NOTE — Progress Notes (Signed)
[facility] [address] [place], [place] [address] [phone]    Cardiology Nuclear Med Study  [name] is a 69 y.o. male     MRN : [id number]     DOB: 04-21-1945  Procedure Date: 05/19/2014  Nuclear Med Background Indication for Stress Test:  Evaluation for Ischemia History:  CAD, MPI ~10 yrs ago, COPD, hx. Afib Cardiac Risk Factors: Carotid Disease and Hypertension  Symptoms:  Dizziness, Fatigue and SOB   Nuclear Pre-Procedure Caffeine/Decaff Intake:  None NPO After: 7:00pm   Lungs:  clear O2 Sat: 96% on room air. IV 0.9% NS with Angio Cath:  22g  IV Site: R Antecubital  IV Started by:  Crissie Figures, RN  Chest Size (in):  44 Cup Size: n/a  Height: 5' 7"  (1.702 m)  Weight:  162 lb (73.483 kg)  BMI:  Body mass index is 25.37 kg/(m^2). Tech Comments:  N/A    Nuclear Med Study 1 or 2 day study: 1 day  Stress Test Type:  Lexiscan  Reading MD: N/A  Order Authorizing Provider:  Lew Dawes, MD  Resting Radionuclide: Technetium 18mSestamibi  Resting Radionuclide Dose: 11.0 mCi   Stress Radionuclide:  Technetium 956mestamibi  Stress Radionuclide Dose: 33.0 mCi           Stress Protocol Rest HR: 59 Stress HR: 90  Rest BP: 153/90 Stress BP: 166/86  Exercise Time (min): n/a METS: n/a   Predicted Max HR: 153 bpm % Max HR: 58.82 bpm Rate Pressure Product: 8100   Dose of Adenosine (mg):  n/a Dose of Lexiscan: 0.4 mg  Dose of Atropine (mg): n/a Dose of Dobutamine: n/a mcg/kg/min (at max HR)  Stress Test Technologist: ShGlade LloydBS-ES  Nuclear Technologist:  ElEarl ManyCNMT     Rest Procedure:  Myocardial perfusion imaging was performed at rest 45 minutes following the intravenous administration of Technetium 9946mstamibi. Rest ECG: No acute changes and NSR ICRBBB septal infarct  Stress Procedure:  The patient received IV Lexiscan 0.4 mg over 15-seconds.  Technetium 18m101mtamibi injected at 30-seconds.   Quantitative spect images were obtained after a 45 minute delay.  During the infusion of Lexiscan the patient complained of SOB and dizziness that began to resolve in recovery. Stress ECG: No significant change from baseline ECG  QPS Raw Data Images:  Marked diaphragamtic motion and gut uptake Stress Images:  possible inferolateral wall defect Rest Images:  Normal homogeneous uptake in all areas of the myocardium. Subtraction (SDS):  SDS 3 Transient Ischemic Dilatation (Normal <1.22):  1.15 Lung/Heart Ratio (Normal <0.45):  0.35  Quantitative Gated Spect Images QGS EDV:  97 ml QGS ESV:  36 ml  Impression Exercise Capacity:  Lexiscan with no exercise. BP Response:  Normal blood pressure response. Clinical Symptoms:  Dizzy and Dyspnic ECG Impression:  No significant ST segment change suggestive of ischemia. Comparison with Prior Nuclear Study: No images to compare  Overall Impression:  Low risk stress nuclear study Poor quality scan with marked gut uptake and diaphragmatic motion on RAW images.  Cannot r/o inferolateral wall ischemia  LV Ejection Fraction: 63%.  LV Wall Motion:  NL LV Function; NL Wall Motion  PeteJenkins [place]
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
## 2014-06-02 ENCOUNTER — Ambulatory Visit (INDEPENDENT_AMBULATORY_CARE_PROVIDER_SITE_OTHER): Payer: Medicare Other | Admitting: Cardiovascular Disease

## 2014-06-02 ENCOUNTER — Encounter: Payer: Self-pay | Admitting: Cardiovascular Disease

## 2014-06-02 ENCOUNTER — Encounter: Payer: Self-pay | Admitting: *Deleted

## 2014-06-02 VITALS — BP 126/80 | HR 64 | Ht 67.0 in | Wt 166.0 lb

## 2014-06-02 DIAGNOSIS — J431 Panlobular emphysema: Secondary | ICD-10-CM

## 2014-06-02 DIAGNOSIS — I2510 Atherosclerotic heart disease of native coronary artery without angina pectoris: Secondary | ICD-10-CM

## 2014-06-02 DIAGNOSIS — I480 Paroxysmal atrial fibrillation: Secondary | ICD-10-CM

## 2014-06-02 DIAGNOSIS — Z01818 Encounter for other preprocedural examination: Secondary | ICD-10-CM

## 2014-06-02 DIAGNOSIS — E785 Hyperlipidemia, unspecified: Secondary | ICD-10-CM

## 2014-06-02 NOTE — Assessment & Plan Note (Signed)
Main NSR  No coumadin no palpitations improved

## 2014-06-02 NOTE — Patient Instructions (Signed)
Your physician has requested that you have a cardiac catheterization. Cardiac catheterization is used to diagnose and/or treat various heart conditions. Doctors may recommend this procedure for a number of different reasons. The most common reason is to evaluate chest pain. Chest pain can be a symptom of coronary artery disease (CAD), and cardiac catheterization can show whether plaque is narrowing or blocking your heart's arteries. This procedure is also used to evaluate the valves, as well as measure the blood flow and oxygen levels in different parts of your heart. For further information please visit [email or web address]. Please follow instruction sheet, as given.    Your physician recommends that you continue on your current medications as directed. Please refer to the Current Medication list given to you today.  Your physician recommends that you return for lab work in:  06-14-14  BMET  CBC  INR

## 2014-06-02 NOTE — Assessment & Plan Note (Signed)
Dyspnea and SSCP with intercourse.  Known CAD with spiral dissection of circumflex in 2006  Cath scheduled for 3/10  Lab called orders written Risks including bleeding MI and need for CABG discussed He is well aware of this as he almost went to OR in 2006

## 2014-06-02 NOTE — Assessment & Plan Note (Signed)
Dyspnea and tightness with intercourse may be related to this Added nitrates and told him not to take cialis  On chronic steroids No active wheezing today f/u pulmonary

## 2014-06-02 NOTE — Assessment & Plan Note (Signed)
Cholesterol is at goal.  Continue current dose of statin and diet Rx.  No myalgias or side effects.  F/U  LFT's in 6 months. Lab Results  Component Value Date   LDLCALC 89 12/31/2013

## 2014-06-07 ENCOUNTER — Telehealth: Payer: Self-pay | Admitting: *Deleted

## 2014-06-07 NOTE — Telephone Encounter (Signed)
NOTIFIED  PT  CATH CHANGED  TO   06-17-14  WITH DR Johnsie Cancel NEEDS  TO BE A T  SHORT  STAY AT   5:30  PT VERBALIZED UNDERSTANDING  AND  WILL ARRIVE  AS  SCHEDULED./CY

## 2014-06-14 ENCOUNTER — Other Ambulatory Visit (INDEPENDENT_AMBULATORY_CARE_PROVIDER_SITE_OTHER): Payer: Medicare Other

## 2014-06-14 DIAGNOSIS — Z01818 Encounter for other preprocedural examination: Secondary | ICD-10-CM

## 2014-06-15 ENCOUNTER — Other Ambulatory Visit: Payer: Medicare Other

## 2014-06-15 LAB — CBC WITH DIFFERENTIAL/PLATELET
Basophils Absolute: 0.1 10*3/uL (ref 0.0–0.1)
Basophils Relative: 0.7 % (ref 0.0–3.0)
Eosinophils Absolute: 0 10*3/uL (ref 0.0–0.7)
Eosinophils Relative: 0.3 % (ref 0.0–5.0)
HCT: 40 % (ref 39.0–52.0)
Hemoglobin: 13.6 g/dL (ref 13.0–17.0)
Lymphocytes Relative: 18.5 % (ref 12.0–46.0)
Lymphs Abs: 1.4 10*3/uL (ref 0.7–4.0)
MCHC: 33.9 g/dL (ref 30.0–36.0)
MCV: 92.7 fl (ref 78.0–100.0)
Monocytes Absolute: 0.3 10*3/uL (ref 0.1–1.0)
Monocytes Relative: 4.3 % (ref 3.0–12.0)
Neutro Abs: 5.7 10*3/uL (ref 1.4–7.7)
Neutrophils Relative %: 76.2 % (ref 43.0–77.0)
Platelets: 210 10*3/uL (ref 150.0–400.0)
RBC: 4.32 Mil/uL (ref 4.22–5.81)
RDW: 14 % (ref 11.5–15.5)
WBC: 7.4 10*3/uL (ref 4.0–10.5)

## 2014-06-15 LAB — BASIC METABOLIC PANEL
BUN: 27 mg/dL — ABNORMAL HIGH (ref 6–23)
CO2: 25 mEq/L (ref 19–32)
Calcium: 9.2 mg/dL (ref 8.4–10.5)
Chloride: 105 mEq/L (ref 96–112)
Creatinine, Ser: 1.22 mg/dL (ref 0.40–1.50)
GFR: 62.95 mL/min (ref >60.00)
Glucose, Bld: 113 mg/dL — ABNORMAL HIGH (ref 70–99)
Potassium: 5.1 mEq/L (ref 3.5–5.1)
Sodium: 136 mEq/L (ref 135–145)

## 2014-06-15 LAB — PROTIME-INR
INR: 1.1 ratio — ABNORMAL HIGH (ref 0.8–1.0)
Prothrombin Time: 12.3 s (ref 9.6–13.1)

## 2014-06-16 ENCOUNTER — Other Ambulatory Visit: Payer: Self-pay | Admitting: Internal Medicine

## 2014-06-17 ENCOUNTER — Ambulatory Visit (HOSPITAL_COMMUNITY)
Admission: RE | Admit: 2014-06-17 | Discharge: 2014-06-18 | Disposition: A | Discharge status: Home / Self Care | Payer: Medicare Other | Source: Ambulatory Visit | Attending: Cardiovascular Disease | Admitting: Cardiovascular Disease

## 2014-06-17 ENCOUNTER — Other Ambulatory Visit: Payer: Self-pay

## 2014-06-17 ENCOUNTER — Encounter (HOSPITAL_COMMUNITY)
Admission: RE | Disposition: A | Discharge status: Home / Self Care | Payer: Medicare Other | Source: Ambulatory Visit | Attending: Cardiovascular Disease

## 2014-06-17 ENCOUNTER — Encounter (HOSPITAL_COMMUNITY): Payer: Self-pay | Admitting: General Practice

## 2014-06-17 DIAGNOSIS — I2510 Atherosclerotic heart disease of native coronary artery without angina pectoris: Secondary | ICD-10-CM

## 2014-06-17 DIAGNOSIS — I4891 Unspecified atrial fibrillation: Secondary | ICD-10-CM | POA: Insufficient documentation

## 2014-06-17 DIAGNOSIS — J449 Chronic obstructive pulmonary disease, unspecified: Secondary | ICD-10-CM | POA: Insufficient documentation

## 2014-06-17 DIAGNOSIS — Z79899 Other long term (current) drug therapy: Secondary | ICD-10-CM | POA: Insufficient documentation

## 2014-06-17 DIAGNOSIS — Z955 Presence of coronary angioplasty implant and graft: Secondary | ICD-10-CM | POA: Insufficient documentation

## 2014-06-17 DIAGNOSIS — G4730 Sleep apnea, unspecified: Secondary | ICD-10-CM | POA: Insufficient documentation

## 2014-06-17 DIAGNOSIS — I10 Essential (primary) hypertension: Secondary | ICD-10-CM | POA: Diagnosis not present

## 2014-06-17 DIAGNOSIS — M1990 Unspecified osteoarthritis, unspecified site: Secondary | ICD-10-CM | POA: Diagnosis not present

## 2014-06-17 DIAGNOSIS — I208 Other forms of angina pectoris: Secondary | ICD-10-CM | POA: Diagnosis present

## 2014-06-17 DIAGNOSIS — K219 Gastro-esophageal reflux disease without esophagitis: Secondary | ICD-10-CM | POA: Diagnosis not present

## 2014-06-17 DIAGNOSIS — I25118 Atherosclerotic heart disease of native coronary artery with other forms of angina pectoris: Secondary | ICD-10-CM | POA: Insufficient documentation

## 2014-06-17 DIAGNOSIS — Z7982 Long term (current) use of aspirin: Secondary | ICD-10-CM | POA: Insufficient documentation

## 2014-06-17 DIAGNOSIS — M069 Rheumatoid arthritis, unspecified: Secondary | ICD-10-CM | POA: Insufficient documentation

## 2014-06-17 HISTORY — DX: Acute myocardial infarction, unspecified: I21.9

## 2014-06-17 HISTORY — PX: CORONARY ANGIOPLASTY WITH STENT PLACEMENT: SHX49

## 2014-06-17 HISTORY — PX: LEFT HEART CATHETERIZATION WITH CORONARY ANGIOGRAM: SHX5451

## 2014-06-17 LAB — POCT ACTIVATED CLOTTING TIME: Activated Clotting Time: 503 seconds

## 2014-06-17 SURGERY — LEFT HEART CATHETERIZATION WITH CORONARY ANGIOGRAM
Anesthesia: LOCAL

## 2014-06-17 MED ORDER — ASPIRIN 81 MG PO TBEC
81.0000 mg | DELAYED_RELEASE_TABLET | Freq: Every morning | ORAL | Status: DC
  Filled 2014-06-17 (×2): qty 1

## 2014-06-17 MED ORDER — SODIUM CHLORIDE 0.9 % IV SOLN: 1.0000 mL/kg/h | INTRAVENOUS | Status: AC

## 2014-06-17 MED ORDER — SIMVASTATIN 20 MG PO TABS
20.0000 mg | ORAL_TABLET | Freq: Every day | ORAL | Status: DC
  Administered 2014-06-17: 22:00:00 20 mg via ORAL
  Filled 2014-06-17 (×2): qty 1

## 2014-06-17 MED ORDER — SODIUM CHLORIDE 0.9 % IJ SOLN: 3.0000 mL | Freq: Two times a day (BID) | INTRAMUSCULAR | Status: DC

## 2014-06-17 MED ORDER — ALUM & MAG HYDROXIDE-SIMETH 200-200-20 MG/5ML PO SUSP
30.0000 mL | Freq: Four times a day (QID) | ORAL | Status: DC | PRN
  Administered 2014-06-17 (×2): 30 mL via ORAL
  Filled 2014-06-17 (×2): qty 30

## 2014-06-17 MED ORDER — FLUTICASONE FUROATE-VILANTEROL 100-25 MCG/INH IN AEPB: 1.0000 | INHALATION_SPRAY | Freq: Every day | RESPIRATORY_TRACT | Status: DC

## 2014-06-17 MED ORDER — METOPROLOL TARTRATE 25 MG PO TABS
25.0000 mg | ORAL_TABLET | Freq: Two times a day (BID) | ORAL | Status: DC
  Administered 2014-06-17: 22:00:00 25 mg via ORAL
  Filled 2014-06-17: qty 1

## 2014-06-17 MED ORDER — NITROGLYCERIN 1 MG/10 ML FOR IR/CATH LAB
INTRA_ARTERIAL | Status: AC
  Filled 2014-06-17: qty 10

## 2014-06-17 MED ORDER — MIDAZOLAM HCL 2 MG/2ML IJ SOLN
INTRAMUSCULAR | Status: AC
  Filled 2014-06-17: qty 2

## 2014-06-17 MED ORDER — FLUOXETINE HCL 20 MG PO CAPS
40.0000 mg | ORAL_CAPSULE | Freq: Two times a day (BID) | ORAL | Status: DC
  Administered 2014-06-17: 22:00:00 40 mg via ORAL
  Filled 2014-06-17 (×4): qty 2

## 2014-06-17 MED ORDER — CLOPIDOGREL BISULFATE 75 MG PO TABS
75.0000 mg | ORAL_TABLET | Freq: Every day | ORAL | Status: DC
  Administered 2014-06-18: 75 mg via ORAL
  Filled 2014-06-17: qty 1

## 2014-06-17 MED ORDER — POLYETHYL GLYCOL-PROPYL GLYCOL 0.4-0.3 % OP SOLN: 2.0000 [drp] | Freq: Every day | OPHTHALMIC | Status: DC

## 2014-06-17 MED ORDER — GABAPENTIN 100 MG PO CAPS
200.0000 mg | ORAL_CAPSULE | Freq: Three times a day (TID) | ORAL | Status: DC
  Administered 2014-06-17 (×2): 200 mg via ORAL
  Filled 2014-06-17 (×5): qty 2

## 2014-06-17 MED ORDER — HEPARIN (PORCINE) IN NACL 2-0.9 UNIT/ML-% IJ SOLN
INTRAMUSCULAR | Status: AC
  Filled 2014-06-17: qty 1500

## 2014-06-17 MED ORDER — CLOPIDOGREL BISULFATE 300 MG PO TABS
ORAL_TABLET | ORAL | Status: AC
  Filled 2014-06-17: qty 1

## 2014-06-17 MED ORDER — ALBUTEROL SULFATE HFA 108 (90 BASE) MCG/ACT IN AERS: 2.0000 | INHALATION_SPRAY | Freq: Four times a day (QID) | RESPIRATORY_TRACT | Status: DC | PRN

## 2014-06-17 MED ORDER — SODIUM CHLORIDE 0.9 % IV SOLN: 250.0000 mL | INTRAVENOUS | Status: DC | PRN

## 2014-06-17 MED ORDER — BIVALIRUDIN 250 MG IV SOLR
INTRAVENOUS | Status: AC
  Filled 2014-06-17: qty 250

## 2014-06-17 MED ORDER — ASPIRIN 81 MG PO CHEW
81.0000 mg | CHEWABLE_TABLET | ORAL | Status: AC
  Administered 2014-06-17: 81 mg via ORAL

## 2014-06-17 MED ORDER — ONDANSETRON HCL 4 MG/2ML IJ SOLN: 4.0000 mg | Freq: Four times a day (QID) | INTRAMUSCULAR | Status: DC | PRN

## 2014-06-17 MED ORDER — FAMOTIDINE IN NACL 20-0.9 MG/50ML-% IV SOLN
INTRAVENOUS | Status: AC
  Filled 2014-06-17: qty 50

## 2014-06-17 MED ORDER — ASPIRIN 81 MG PO CHEW
CHEWABLE_TABLET | ORAL | Status: AC
  Administered 2014-06-17: 81 mg via ORAL
  Filled 2014-06-17: qty 1

## 2014-06-17 MED ORDER — PANTOPRAZOLE SODIUM 20 MG PO TBEC
20.0000 mg | DELAYED_RELEASE_TABLET | Freq: Two times a day (BID) | ORAL | Status: DC
  Administered 2014-06-17 – 2014-06-18 (×2): 20 mg via ORAL
  Filled 2014-06-17 (×4): qty 1

## 2014-06-17 MED ORDER — LOSARTAN POTASSIUM 50 MG PO TABS
50.0000 mg | ORAL_TABLET | Freq: Every day | ORAL | Status: DC
  Administered 2014-06-17 – 2014-06-18 (×2): 50 mg via ORAL
  Filled 2014-06-17 (×2): qty 1

## 2014-06-17 MED ORDER — PREDNISONE 10 MG PO TABS
10.0000 mg | ORAL_TABLET | Freq: Every day | ORAL | Status: DC
  Filled 2014-06-17: qty 1

## 2014-06-17 MED ORDER — VERAPAMIL HCL 2.5 MG/ML IV SOLN
INTRAVENOUS | Status: AC
  Filled 2014-06-17: qty 2

## 2014-06-17 MED ORDER — POLYVINYL ALCOHOL 1.4 % OP SOLN
1.0000 [drp] | Freq: Every day | OPHTHALMIC | Status: DC
  Administered 2014-06-17: 1 [drp] via OPHTHALMIC
  Filled 2014-06-17: qty 15

## 2014-06-17 MED ORDER — LOSARTAN POTASSIUM-HCTZ 50-12.5 MG PO TABS: 1.0000 | ORAL_TABLET | Freq: Every day | ORAL | Status: DC

## 2014-06-17 MED ORDER — SODIUM CHLORIDE 0.9 % IJ SOLN: 3.0000 mL | INTRAMUSCULAR | Status: DC | PRN

## 2014-06-17 MED ORDER — LIDOCAINE HCL (PF) 1 % IJ SOLN
INTRAMUSCULAR | Status: AC
  Filled 2014-06-17: qty 30

## 2014-06-17 MED ORDER — SODIUM CHLORIDE 0.9 % IV SOLN
INTRAVENOUS | Status: DC
  Administered 2014-06-17 (×3): via INTRAVENOUS

## 2014-06-17 MED ORDER — HYDROCHLOROTHIAZIDE 12.5 MG PO CAPS
12.5000 mg | ORAL_CAPSULE | Freq: Every day | ORAL | Status: DC
  Filled 2014-06-17 (×2): qty 1

## 2014-06-17 MED ORDER — ALPRAZOLAM 0.5 MG PO TABS: 0.5000 mg | ORAL_TABLET | Freq: Two times a day (BID) | ORAL | Status: DC | PRN

## 2014-06-17 MED ORDER — FENTANYL CITRATE 0.05 MG/ML IJ SOLN
INTRAMUSCULAR | Status: AC
  Filled 2014-06-17: qty 2

## 2014-06-17 MED ORDER — HEPARIN SODIUM (PORCINE) 1000 UNIT/ML IJ SOLN
INTRAMUSCULAR | Status: AC
  Filled 2014-06-17: qty 1

## 2014-06-17 MED ORDER — METHOCARBAMOL 500 MG PO TABS
500.0000 mg | ORAL_TABLET | Freq: Two times a day (BID) | ORAL | Status: DC
  Administered 2014-06-17: 500 mg via ORAL
  Filled 2014-06-17: qty 1

## 2014-06-17 NOTE — CV Procedure (Signed)
    CARDIAC CATH NOTE  Name: [name] MRN: [id number] DOB: Apr 26, 1945  Procedure: PTCA and stenting of the LAD  Indication: Exertional angina, CCS Class 3 symptoms  Procedural Details:  Weight-based bivalirudin was given for anticoagulation. The patient underwent diagnostic catheterization this morning by Dr. Johnsie Cancel. He has CCS class III exertional angina on good medical therapy with metoprolol, aspirin, and losartan/chlorothiazide. A Myoview scan was poor quality, but the patient has known CAD and classic symptoms of exertional angina. Cardiac catheterization showed severe diffuse proximal LAD stenosis extending into the mid vessel. We elected to perform PCI after review of the films with Dr. Johnsie Cancel. Once a therapeutic ACT was achieved, a 6 [place] XB LAD 3.5 cm guide catheter was inserted.  A cougar coronary guidewire was used to cross the lesion.  The lesion was predilated with a 2.5 mm balloon.  The lesion was long and there was some vessel mismatch, so I elected to proceed with 2 separate stents. The distal portion of the lesion was treated with a 2.5 x 24 mm Promus premier DES. The proximal lesion was treated in overlapping fashion with a 3.0 x 20 mm Promus premier DES. This entire stented segment was postdilated with a 3.25 mm balloon to 12 atm distally and 18 atm proximally.  Following PCI, there was 0% residual stenosis and TIMI-3 flow. Final angiography confirmed an excellent result. The patient tolerated the procedure well. There were no immediate procedural complications. A TR band was used for radial hemostasis. The patient was transferred to the post catheterization recovery area for further monitoring.  Lesion Data: Vessel: LAD/proximal to mid Percent stenosis (pre): 80 TIMI-flow (pre):  3 Stent:  2.5 x 24 mm Promus DES and 3.0 x 20 mm Promus DES (overlapping) Percent stenosis (post): 0 TIMI-flow (post): 3  Estimated Blood Loss: Minimal  Conclusions: Successful PCI of  diffuse severe stenosis in the proximal to mid LAD using overlapping drug-eluting stent platforms  Recommendations: Dual antiplatelet therapy with aspirin and Plavix for at least 12 months.    Sherren Mocha MD, [facility] 06/17/2014, 9:16 AM
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
## 2014-06-17 NOTE — CV Procedure (Signed)
    Cardiac Catheterization Procedure Note  Name: [name] MRN: [id number] DOB: [birth year]  Procedure: Left Heart Cath, Selective Coronary Angiography, LV angiography  Indication:  Abnormal myovue Previous stent to OM 2006   Procedural Details: The right wrist was prepped, draped, and anesthetized with 1% lidocaine. Using the modified Seldinger technique, a 5/6 French Slender sheath was introduced into the right radial artery. 3 mg of verapamil was administered through the sheath, weight-based unfractionated heparin was administered intravenously. Standard Judkins catheters were used for selective coronary angiography and left ventriculography. Catheter exchanges were performed over an exchange length guidewire. There were no immediate procedural complications. A TR band was used for radial hemostasis at the completion of the procedure.  The patient was transferred to the post catheterization recovery area for further monitoring.  Procedural Findings: Hemodynamics: AO 118 66 LV  116 13   Coronary angiography: Coronary dominance: right  Left mainstem: Normal  Left anterior descending (LAD): 80% proximal lesion before D1 50% just distal to D1 80% distal   D1 30% multiple discrete  Left circumflex (LCx):  Primarily AV groove and OM1  Widely patent stents in OM  Right coronary artery (RCA):  Dominant 30% proximal  50%  Mid vessel stenosis    Left ventriculography: Left ventricular systolic function is normal, LVEF is estimated at 55-65%, there is no significant mitral regurgitation     Final Conclusions:  New LAD lesions patent stents in OM  Recommendations: Reviewed with Dr Burt Knack  PCI/Stent mulitple sights in LAD   Jenkins Rouge MD, [facility] 06/17/2014, 8:08 AM

## 2014-06-17 NOTE — Interval H&P Note (Signed)
History and Physical Interval Note:  06/17/2014 7:13 AM  [name]  has presented today for surgery, with the diagnosis of cp  The various methods of treatment have been discussed with the patient and family. After consideration of risks, benefits and other options for treatment, the patient has consented to  Procedure(s): LEFT HEART CATHETERIZATION WITH CORONARY ANGIOGRAM (N/A) as a surgical intervention .  The patient's history has been reviewed, patient examined, no change in status, stable for surgery.  I have reviewed the patient's chart and labs.  Questions were answered to the patient's satisfaction.     Jenkins Rouge

## 2014-06-17 NOTE — Telephone Encounter (Signed)
Had to leave refill on pharmacy vm left md approval.../lmb

## 2014-06-17 NOTE — Progress Notes (Signed)
TR BAND REMOVAL  LOCATION:  right radial  DEFLATED PER PROTOCOL:  Yes.    TIME BAND OFF / DRESSING APPLIED:   1430   SITE UPON ARRIVAL:   Level 0  SITE AFTER BAND REMOVAL:  Level 0  REVERSE ALLEN'S TEST:    positive  CIRCULATION SENSATION AND MOVEMENT:  Within Normal Limits  Yes.    COMMENTS:

## 2014-06-18 ENCOUNTER — Encounter (HOSPITAL_COMMUNITY): Payer: Self-pay | Admitting: Physician Assistant

## 2014-06-18 DIAGNOSIS — I4891 Unspecified atrial fibrillation: Secondary | ICD-10-CM | POA: Diagnosis not present

## 2014-06-18 DIAGNOSIS — I10 Essential (primary) hypertension: Secondary | ICD-10-CM | POA: Diagnosis not present

## 2014-06-18 DIAGNOSIS — I25118 Atherosclerotic heart disease of native coronary artery with other forms of angina pectoris: Secondary | ICD-10-CM | POA: Diagnosis not present

## 2014-06-18 DIAGNOSIS — J449 Chronic obstructive pulmonary disease, unspecified: Secondary | ICD-10-CM | POA: Diagnosis not present

## 2014-06-18 DIAGNOSIS — I25119 Atherosclerotic heart disease of native coronary artery with unspecified angina pectoris: Secondary | ICD-10-CM

## 2014-06-18 DIAGNOSIS — I208 Other forms of angina pectoris: Secondary | ICD-10-CM

## 2014-06-18 LAB — BASIC METABOLIC PANEL
Anion gap: 7 (ref 5–15)
BUN: 14 mg/dL (ref 6–23)
CO2: 28 mmol/L (ref 19–32)
Calcium: 8.8 mg/dL (ref 8.4–10.5)
Chloride: 104 mmol/L (ref 96–112)
Creatinine, Ser: 0.97 mg/dL (ref 0.50–1.35)
GFR calc Af Amer: >90 mL/min (ref >90)
GFR calc non Af Amer: 84 mL/min — ABNORMAL LOW (ref >90)
Glucose, Bld: 91 mg/dL (ref 70–99)
Potassium: 4 mmol/L (ref 3.5–5.1)
Sodium: 139 mmol/L (ref 135–145)

## 2014-06-18 LAB — CBC
HCT: 38.5 % — ABNORMAL LOW (ref 39.0–52.0)
Hemoglobin: 12.7 g/dL — ABNORMAL LOW (ref 13.0–17.0)
MCH: 31 pg (ref 26.0–34.0)
MCHC: 33 g/dL (ref 30.0–36.0)
MCV: 93.9 fL (ref 78.0–100.0)
Platelets: 173 10*3/uL (ref 150–400)
RBC: 4.1 MIL/uL — ABNORMAL LOW (ref 4.22–5.81)
RDW: 13.2 % (ref 11.5–15.5)
WBC: 6 10*3/uL (ref 4.0–10.5)

## 2014-06-18 MED ORDER — CLOPIDOGREL BISULFATE 75 MG PO TABS: 75.0000 mg | ORAL_TABLET | Freq: Every day | ORAL | Status: DC

## 2014-06-18 NOTE — Progress Notes (Signed)
CARDIAC REHAB PHASE I   PRE:  Rate/Rhythm: 66 SR  BP:  Supine: 161/81 158/83 Sitting:   Standing:    SaO2: 95% RA  MODE:  Ambulation: 600 ft   POST:  Rate/Rhythm: 75 SR  BP:  Supine:   Sitting: 169/93  Standing:    SaO2: 93% RA  2527-1292 Pt tolerated ambulation well with assist x1 and pushing rolling walker. Gait slow, steady, mild SOB with exertion, no CP or other c/o. PCI/stent education complete including restrictions, risk factor modification, heart healthy diet, and exercise guidelines given. Discussed Phase 2 cardiac rehab and pt is not interested at this time. Pt verbalized understanding of instructions given.  Sol Passer, MS, ACSM CCEP

## 2014-06-18 NOTE — Progress Notes (Signed)
Primary cardiologist: Dr. Jenkins Rouge  Subjective: No complaints.  Ambulated well with CR.   Objective: Vital signs in last 24 hours: Temp:  [97.4 F (36.3 C)-98 F (36.7 C)] 98 F (36.7 C) (03/12 0538) Pulse Rate:  [60-69] 64 (03/12 0538) Resp:  [16-32] 18 (03/12 0538) BP: (100-155)/(62-89) 148/82 mmHg (03/12 0538) SpO2:  [91 %-97 %] 95 % (03/12 0538) Weight:  [165 lb 9.1 oz (75.1 kg)] 165 lb 9.1 oz (75.1 kg) (03/12 0016) Last BM Date: 06/16/14  Intake/Output from previous day: 03/11 0701 - 03/12 0700 In: 1833.8 [P.O.:840; I.V.:993.8] Out: 2325 [Urine:2325]  Medications Current Facility-Administered Medications  Medication Dose Route Frequency Provider Last Rate Last Dose  . 0.9 %  sodium chloride infusion  250 mL Intravenous PRN Josue Hector, MD      . 0.9 %  sodium chloride infusion  250 mL Intravenous PRN Sherren Mocha, MD      . ALPRAZolam Duanne Moron) tablet 0.5 mg  0.5 mg Oral BID PRN Sherren Mocha, MD      . alum & mag hydroxide-simeth (MAALOX/MYLANTA) 200-200-20 MG/5ML suspension 30 mL  30 mL Oral Q6H PRN Josue Hector, MD   30 mL at 06/17/14 1608  . aspirin EC tablet 81 mg  81 mg Oral q morning - 10a Sherren Mocha, MD   81 mg at 06/17/14 1311  . clopidogrel (PLAVIX) tablet 75 mg  75 mg Oral Q breakfast Sherren Mocha, MD      . FLUoxetine (PROZAC) capsule 40 mg  40 mg Oral BID Sherren Mocha, MD   40 mg at 06/17/14 2136  . gabapentin (NEURONTIN) capsule 200 mg  200 mg Oral TID Sherren Mocha, MD   200 mg at 06/17/14 2136  . losartan (COZAAR) tablet 50 mg  50 mg Oral Daily Josue Hector, MD   50 mg at 06/17/14 1315   And  . hydrochlorothiazide (MICROZIDE) capsule 12.5 mg  12.5 mg Oral Daily Josue Hector, MD   12.5 mg at 06/17/14 1312  . methocarbamol (ROBAXIN) tablet 500 mg  500 mg Oral BID Sherren Mocha, MD   500 mg at 06/17/14 2136  . metoprolol tartrate (LOPRESSOR) tablet 25 mg  25 mg Oral BID Sherren Mocha, MD   25 mg at 06/17/14 2136  . ondansetron  (ZOFRAN) injection 4 mg  4 mg Intravenous Q6H PRN Sherren Mocha, MD      . pantoprazole (PROTONIX) EC tablet 20 mg  20 mg Oral BID WC Sherren Mocha, MD   20 mg at 06/17/14 1703  . polyvinyl alcohol (LIQUIFILM TEARS) 1.4 % ophthalmic solution 1 drop  1 drop Both Eyes Daily Josue Hector, MD   1 drop at 06/17/14 2136  . predniSONE (DELTASONE) tablet 10 mg  10 mg Oral Daily Sherren Mocha, MD      . simvastatin (ZOCOR) tablet 20 mg  20 mg Oral QHS Sherren Mocha, MD   20 mg at 06/17/14 2136  . sodium chloride 0.9 % injection 3 mL  3 mL Intravenous Q12H Sherren Mocha, MD      . sodium chloride 0.9 % injection 3 mL  3 mL Intravenous PRN Sherren Mocha, MD        PE: General appearance: alert, cooperative and no distress Lungs: clear to auscultation bilaterally Heart: regular rate and rhythm, S1, S2 normal, no murmur, click, rub or gallop Extremities: No LEE Pulses: 2+ and symmetric Skin: Radial cath site:  nontender, no ecchymosis Neurologic: Grossly normal  Lab Results:  Recent Labs  06/18/14 0515  WBC 6.0  HGB 12.7*  HCT 38.5*  PLT 173   BMET  Recent Labs  06/18/14 0515  NA 139  K 4.0  CL 104  CO2 28  GLUCOSE 91  BUN 14  CREATININE 0.97  CALCIUM 8.8     Assessment/Plan  Principal Problem:   Exertional angina Active Problems:   Essential hypertension   Coronary atherosclerosis   COPD (chronic obstructive pulmonary disease)     SP successful PCI of diffuse severe stenosis in the proximal to mid LAD using overlapping drug-eluting stents.  Dual antiplatelet therapy with aspirin and Plavix for at least 12 months.  HCTZ 12.5, cozaar 50, lopressor 25 BID, Zocor 20.   BP elevated this morning. Cozaar was held yesterday.  Give this morning before DC.   Post hospital FU with Extender then with Dr.Nishan.    Tarri Fuller PA-C 06/18/2014 8:14 AM   Attending note:  Patient seen and examined. Agree with above assessment by Mr. Lucia Gaskins. Patient is status post DES  2 to the proximal and mid LAD yesterday with Dr. Burt Knack, otherwise documented patency of obtuse marginal stents and normal LVEF. Lungs clear and nonlabored, cardiac catheterization access site stable, cardiac exam with RRR and no gallop. He has already ambulated in the hall without difficulty. Blood pressure elevated today, although Cozaar held yesterday. This will be resumed with stable renal function. He is stable for discharge home today. We discussed DAPT.  Satira Sark, M.D., F.A.C.C.
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
## 2014-06-21 MED FILL — Sodium Chloride IV Soln 0.9%: INTRAVENOUS | Qty: 50 | Status: AC

## 2014-06-29 ENCOUNTER — Encounter: Payer: Self-pay | Admitting: Neurology

## 2014-06-29 ENCOUNTER — Ambulatory Visit (INDEPENDENT_AMBULATORY_CARE_PROVIDER_SITE_OTHER): Payer: Medicare Other | Admitting: Neurology

## 2014-06-29 VITALS — BP 133/77 | HR 61 | Temp 97.8°F | Resp 16 | Ht 67.0 in | Wt 166.0 lb

## 2014-06-29 DIAGNOSIS — R51 Headache: Secondary | ICD-10-CM | POA: Diagnosis not present

## 2014-06-29 DIAGNOSIS — R269 Unspecified abnormalities of gait and mobility: Secondary | ICD-10-CM | POA: Diagnosis not present

## 2014-06-29 DIAGNOSIS — M069 Rheumatoid arthritis, unspecified: Secondary | ICD-10-CM

## 2014-06-29 DIAGNOSIS — R519 Headache, unspecified: Secondary | ICD-10-CM

## 2014-06-29 NOTE — Progress Notes (Signed)
Subjective:    [name] ID: John [name] is a 69 y.o. male.  HPI     Interim history:   John [name] is a 69 year old right-handed gentleman with an underlying complex medical history of hypertension, osteoarthritis, diverticulosis, hemachromatosis, coronary artery disease, allergic rhinitis, rheumatoid arthritis, and COPD, who presents for followup consultation of his gait disorder, and recurrent headaches. John [name] is unaccompanied today. I last saw John [name] on 01/28/2014, at which time John [name] reported doing well. His headaches resolved after John [name] was placed on vancomycin. John [name] was off of Humira and did not start Enbrel per rheumatology. John [name] was on Nucynta once daily. John [name] was overall doing well since his last hospitalization. I did not suggest any new medications. In John interim, John [name] had a follow-up with his cardiologist. John [name] was reporting exertional chest pain. John [name] had a stress test in February 2016 which I reviewed which was fairly benign. John [name] did have a left heart catheter and had stenting of John LAD on 06/17/2014. I reviewed John hospital records. John [name] was placed on dual antiplatelet therapy which she is supposed to continue for 12 months.  Today, 06/29/2014: John [name] is taking ASA 81 mg, and Plavix 75 mg. John [name] takes Aleve, and very rarely Nucynta, once or 2 times a week, John [name] takes Robaxin bid. John [name] has no more SOB and no angina. John [name] feels well. John [name] is trying to exercise. John [name] still has limitation because of back pain and his rheumatoid arthritis. John [name] would like to be able to walk a mile. John [name] wants to travel to [place]. John [name] may not always drink enough water. John [name] drinks 1 cup of coffee in John day and sometimes a glass of tea. John [name] takes 2 Benadryl each night. It may not always help John [name] feels. Overall John [name] sleeps well. Headaches are very few now. John [name] had an extended period of treatment with IV antibiotics and this in combination with taking Robaxin twice daily has helped John [name] tremendously. John [name] still takes gabapentin every day as well. This is from  when John [name] had back surgery years ago.   Previously:  I saw John [name] on 09/20/2013, at which time John [name] presented for a sooner than scheduled appointment for new onset headaches. I had seen John [name] before for altered mental status and balance problems as well as ataxia. John [name] reported at John last visit new onset headaches since March 2015. These are typically right-sided. John [name] had tried Imitrex per primary care provider and had also been given IV Depacon which helped. John [name] was seen in ENT. John [name] had gone through speech therapy. In John interim, John [name] was seen by our nurse practitioner, Jeani Hawking on 11/18/2013 for a sooner than scheduled appointment because of an acute severe headache. John [name] was found to have a fever of 101.7 orally. We sent John [name] to John emergency room in light of his fever, history of immune suppressant use, severe headache associated with nausea and also neck stiffness. John [name] was treated for possible subdural empyema with IV antibiotics and discharged on oral antibiotics. MRI brain without contrast on 11/18/2013 showed no new findings and chronic changes. MRI lumbar spine with and without contrast on 11/18/2013 showed: Enhancing debris in John dependent portion of John thecal sac and upper sacral subdural space consistent with postoperative infection/meningitis/subdural empyema. This has significantly progressed from February 2015. Surgical consultation is warranted. Slight enhancement of John L3-4 disc space has progressed from priors, equivocal for diskitis.    I saw John [name] on 07/26/2013, at which time we discussed his multiple recent hospitalizations. John [name] reported problems with hoarseness  and softer speech and I referred John [name] to ENT. I also ordered a swallow study. This was done on 08/11/2013 and showed mild sensory base pharyngeal dysphagia, delayed swallow reflex, penetration and aspiration of thin and nectar thick liquids with no reflexive cough, compensatory positioning with chin talk was ineffective, however individual small boluses  decreased penetration and aspiration. John [name] was advised to followup with speech therapy. I made a referral in that regard. John [name] called on 09/06/2013 reporting a two-week headache. Dr. Brett Fairy saw John [name] on my behalf as I was not in John office that week, and John [name] was treated in our office with Depacon infusion as well as IV Solu-Medrol, 500 mg each. John [name] improved. Dr. Brett Fairy ordered a repeat brain MRI and John [name] had a brain MRI with and without contrast on 09/18/2013: Abnormal MRI scan of John brain showing mild changes of chronic microvascular ischemia and generalized cerebral atrophy. Overall no significant change compared with previous MRI scan dated 06/30/2012. In addition, personally reviewed John images through John PACS system.   I saw John [name] on 12/15/2012, at which time we talked about previous test results again. We talked about his hospitalization from April 2014. Overall John [name] had improved at John time. Workup had shown increased CSF protein, increased IgG index, positive oligoclonal bands. This of course in John context of autoimmune disease, namely rheumatoid arthritis, on Humira. EEG was negative. We looked for cancer with screening CT abdomen pelvis and chest x-ray. John [name] has been on immune suppressant, Humira. Brain MRI did not show any demyelinating process or mass or enhancing lesions. I considered a second opinion at a larger [facility] and a repeat brain MRI. His last CTH was on 06/30/13: Involutional and chronic changes without evidence of focal or acute abnormalities.   On 03/02/2013 John [name] had lower spine surgery with decompression of John thecal sac and facetectomy, L2-3 discectomy, removal of herniated disc, placement of pedicle screws L1-3 and S1, fusion from L1-S1 involving John L4-5 fusion; this was under Dr. Joya Salm. Postoperatively John [name] developed hypotension. John [name] was discharged on 03/09/2013 to a skilled nursing facility. On 03/16/2013 John [name] developed fever and vomiting and was readmitted under neurosurgery. John [name]  was found to have hypovolemia, hyponatremia and elevation in BUN and creatinine. On 03/17/2013 John [name] had revision of a lumbar wound.   On 03/19/2013 John [name] was seen by infectious disease for a lumbar wound infection and treatment thereof. John [name] was discharged to home on 04/06/2013 after being treated for a wound infection with IV antibiotics. John [name] presented on 04/13/2013 with fever and hypotension and was sent to John emergency room for further workup and treatment. ENT was consulted for hoarseness and GI was consulted for concern for small bowel obstruction which was treated conservatively. John [name] was discharged on 04/24/2013. John [name] has had multiple falls with and without injuries, most recently a laceration to his face on 07/01/2013. John [name] has had [facility] physical therapy and speech therapy. John [name] lives with his son currently. John [name] is supposed to have 24 hour supervision and has not been fully compliant with John use of assistive devices as recommended by home health PT.   John [name] was admitted on 05/06/2013 for AMS, deemed secondary to polypharmacy including opioid overdose, presenting with mental status changes, acute renal failure, acute respiratory failure with hypoxia. John [name] was discharged on 05/09/2013.   John [name] was admitted on 05/23/2013 with generalized weakness, fever, nausea and vomiting. John [name] was transferred to inpatient rehabilitation on 05/27/2013. John [name] was discharged on 06/10/2013.   John [name] was admitted on 06/16/2013 for altered  mental status. John [name] was discharged on 06/22/2013. John [name] presented to John emergency room on 06/30/2013 for fever. John [name] presented to John emergency room on 07/01/2013 for a fall.   I saw John [name] on 10/02/2012 after a recent hospitalization.   I first met John [name] on 07/14/2012 at John request of Dr. Ronnald Ramp. John [name] has an underlying complex medical history including rheumatoid arthritis, osteoarthritis, COPD, atrial fibrillation, anemia, hypertension, heart disease, reported fairly sudden onset unsteadiness of his gait, vertigo, dizziness, blurry  vision since early March 2014 there was no significant prodromal illness. His wife had noted some slurring of speech. Head CT on 06/22/2012 showed no acute abnormality. 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 John [name] also reported some nausea for which meclizine and scopolamine patch helped. I arranged for a hospital admission from clinic because of concerns with cerebellar dysfunction including dysarthria, nystagmus, but also some alteration in his mental status. John [name] was not fully oriented and was sluggish in his responses and seemed confused, talking out of context at times. John [name] had mild dysmetria bilaterally.   CSF showed: increased protein, increased IgG index and positive oligoclonal bands in his CSF. His altered mental status improved and his exam was better and John [name] benefited from physical therapy which John [name] completed. I felt that John [name] had a variety of differential diagnoses possible including cerebellitis, and cephalitis, metabolic-toxic or other immune-paraneoplastic causes of his presentation. I recommended paraneoplastic testing. EEG was normal, chest x-ray was negative, CT abdomen and pelvis were negative. Of note, John [name] has been on immune suppression with Humira. MRI did not show any demyelinating lesions.   His paraneoplastic testing was negative in July 2014. John [name] had foot surgery on John right. John [name] has been on Humira for John past 2 years, Lawtey injections every other week.   His Past Medical History Is Significant For: Past Medical History  Diagnosis Date  . Hx of colonic polyps   . Diverticulosis   . Hypertension   . Hemochromatosis     dx'd 14 yrs ago last ferritin Aug 11, 08 52 (22-322), Fe 136 ("I had 250 phlebotomies for that")  . CAD (coronary artery disease)     minimal coronary plaque in John LAD and right coronary system. 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  7-06. Last nuclear stress 11-17-06 fixed anterior/ inferior defect, no inducible ischemia, EF 81%  . Allergic rhinitis   . Hx of colonoscopy   . Depression   . GERD (gastroesophageal reflux disease)   . Chronic back pain     "all over back"  . PONV (postoperative nausea and vomiting)   . High cholesterol     hx  . Falls frequently     "since 02/2013" (06/16/2013)  . Anxiety   . Narcotic abuse     per family  . Alcoholism /alcohol abuse     per family  . Bacterial infection   . Myocardial infarction 2006    "related to catheterization"  . Dysrhythmia 01-24-12    past hx. A.Fib x1 episode-responded to med.  [name] COPD (chronic obstructive pulmonary disease)   . Sleep apnea     "never dx'd;" (06/17/2014))  . Migraine     "for 3 months in 2015; related to infection in my back" (06/17/2014)  . Osteoarthritis   . RA (rheumatoid arthritis)     His Past Surgical History Is Significant For: Past Surgical History  Procedure Laterality Date  .  Appendectomy  ~ 1956  . Total knee arthroplasty Bilateral 2002  . Total shoulder replacement Left 2006  . Foot surgery Right 11-08    for removal of bone spurs-  . Ankle reconstruction Right 6-09    [facility]  . Posterior lumbar fusion  12-10    L4-5 diskectomy w/ fusion, cage placement and rods; [place]  . Replacement unicondylar joint knee Left ~ 2003    "~ 6 months after total knee replaced"  . Total shoulder replacement Right ~ 2007    Dr. Marlou Sa  . Joint replacement    . Cataract extraction, bilateral Bilateral 01-24-12  . Orif shoulder fracture  02/06/2012    Procedure: OPEN REDUCTION INTERNAL FIXATION (ORIF) SHOULDER FRACTURE;  Surgeon: Nita Sells, MD;  Location: [facility];  Service: Orthopedics;  Laterality: Left;  ORIF of a Left Shoulder Fracture with  Iliac Crest Bone Graft aspiration   . Harvest bone graft  02/06/2012    Procedure: HARVEST ILIAC BONE GRAFT;  Surgeon: Nita Sells, MD;  Location: [facility];  Service:  Orthopedics;;  bone marrow aspirqation   . Hardware removal  03/09/2012    Procedure: HARDWARE REMOVAL;  Surgeon: Nita Sells, MD;  Location: [place];  Service: Orthopedics;  Laterality: Left;  Hardware Removal from Left Shoulder  . Carpal tunnel release Right 1990's  . Total ankle replacement Right 2008    at [facility]  . Hammer toe surgery Right 07/2012    "broke 4 hammertoes"   . Posterior lumbar fusion 4 level N/A 03/02/2013    Procedure: Lumbar One to Sacral One Posterior lumbar interbody fusion;  Surgeon: Floyce Stakes, MD;  Location: [place] [facility];  Service: Neurosurgery;  Laterality: N/A;  L1 to S1 Posterior lumbar interbody fusion  . Lumbar wound debridement N/A 03/17/2013    Procedure: Incision and drainage of superficial lumbar wound;  Surgeon: Floyce Stakes, MD;  Location: [place] [facility];  Service: Neurosurgery;  Laterality: N/A;  Incision and drainage of superficial lumbar wound  . Coronary angioplasty with stent placement  2006    "while repairing 1st stent, a second area tore and they had to place 2nd stent " ?LAD & CX  . Coronary angioplasty with stent placement  06/17/2014  . Inguinal hernia repair Bilateral   . Fracture surgery    . Bone tumor resection  ~ 1954    "taken off my mastoid"  . Meckel diverticulum excision  ~ 1956  . Abdominal adhesion surgery  ~ 1968  . Knee surgery Left ~ 2003    "6-12 months after uni knee removed synovial sack"  . Cyst excision  "several OR's"    "backX 2, back of my neck, face, inside right bicept, chest, wrist"  . Shoulder arthroscopy Left ~ 2004 X 2    "@ [facility]; left bone splinter in & had to clean it out"  . Left heart catheterization with coronary angiogram N/A 06/17/2014    PCI of diffuse severe stenosis in John proximal to mid LAD using overlapping drug-eluting stents.    His Family History Is Significant For: Family History  Problem Relation Age of Onset  . Uterine cancer Mother     survivor  .  Macular degeneration Mother   . Other Mother     ankle edema  . Lung cancer Mother   . Cancer Mother     ovarian, lung  . Coronary artery disease Father   . Hypertension Father   . Prostate cancer  Father   . Colon polyps Father   . Cancer Father     prostate  . Thyroid disease Sister   . Heart attack Brother   . Hyperlipidemia Brother   . Other Brother     Schizophrenic  . Diabetes Neg Hx   . Colon cancer Neg Hx   . Esophageal cancer Neg Hx   . Rectal cancer Neg Hx   . Stomach cancer Neg Hx   . Coronary artery disease Maternal Aunt   . Heart attack Maternal Aunt   . Rheum arthritis Sister   . Hemochromatosis Sister     His Social History Is Significant For: History   Social History  . Marital Status: Divorced    Spouse Name: N/A  . Number of Children: 2  . Years of Education: 16   Occupational History  . 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 Smoking status: Never Smoker   . Smokeless tobacco: Never Used  . Alcohol Use: No     Comment: "I do not drink anymore"  . Drug Use: No  . Sexual Activity:    Partners: Female   Other Topics Concern  . None   Social History Narrative   [employer] - BA. Married 1972-50yr seperated/divorced. Engaged April '11. 1 son '74; step-daughter '70. 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 During '11 discovered an [employer]- $500,000+ loss. Is handling this OK - will keep John business running.    His Allergies Are:  Allergies  Allergen Reactions  . Cefepime Hives and Shortness Of Breath  . Morphine And Related Shortness Of Breath, Nausea And Vomiting, Swelling and Other (See Comments)    Agitation, tolerates dilaudid  . Penicillins Hives, Shortness Of Breath and Rash    REACTION: hives, breathing problems  . Percocet [Oxycodone-Acetaminophen] Anxiety    Takes OxyContin at  home.  :   His Current Medications Are:  Outpatient Encounter Prescriptions as of 06/29/2014  Medication Sig  . albuterol (PROVENTIL HFA;VENTOLIN HFA) 108 (90 BASE) MCG/ACT inhaler Inhale 2 puffs into John lungs every 6 (six) hours as needed for wheezing or shortness of breath.  . ALPRAZolam (XANAX) 0.5 MG tablet TAKE 1 TABLET BY MOUTH TWICE A DAY AS NEEDED FOR ANXIETY  . aspirin (ASPIRIN EC) 81 MG EC tablet Take 1 tablet (81 mg total) by mouth every morning.  . cholecalciferol (VITAMIN D) 1000 UNITS tablet Take 1 tablet (1,000 Units total) by mouth daily.  . clopidogrel (PLAVIX) 75 MG tablet Take 1 tablet (75 mg total) by mouth daily with breakfast.  . FLUoxetine (PROZAC) 40 MG capsule TAKE ONE CAPSULE BY MOUTH TWICE A DAY  . fluticasone (FLONASE) 50 MCG/ACT nasal spray PLACE 2 SPRAYS INTO John NOSE DAILY.  .Marland KitchenFluticasone Furoate-Vilanterol (BREO ELLIPTA) 100-25 MCG/INH AEPB Inhale 1 Act into John lungs daily.  .Marland Kitchengabapentin (NEURONTIN) 300 MG capsule TAKE 2 CAPSULES BY MOUTH IN John MORNING, 1 CAPSULE MIDDAY AND 2 CAPSULES AT BEDTIME ([name] taking differently: take 1 capsule in John morning and 2 capsules at night)  . methocarbamol (ROBAXIN) 500 MG tablet TAKE 1 TABLET (500 MG TOTAL) BY MOUTH EVERY 8 (EIGHT) HOURS AS NEEDED FOR MUSCLE SPASMS. ([name] taking differently: take 1 tablet by mouth twice daily)  . metoprolol tartrate (LOPRESSOR) 25 MG tablet TAKE 1 TABLET BY MOUTH TWICE A DAY  . nitroGLYCERIN (  NITROSTAT) 0.4 MG SL tablet Place 1 tablet (0.4 mg total) under John tongue every 5 (five) minutes as needed for chest pain.  Gean Birchwood ER 50 MG TB12 Take 1 tablet by mouth 2 (two) times daily as needed (pain).   . ondansetron (ZOFRAN) 4 MG tablet Take 1 tablet (4 mg total) by mouth every 8 (eight) hours as needed for nausea or vomiting.  . pantoprazole (PROTONIX) 20 MG tablet Take 2 tablets (40 mg total) by mouth 2 (two) times daily. ([name] taking differently: Take 20 mg by mouth 2 (two) times  daily. )  . Polyethyl Glycol-Propyl Glycol 0.4-0.3 % SOLN Apply 2 drops to eye daily.  . potassium chloride SA (KLOR-CON M20) 20 MEQ tablet Take 1 tablet (20 mEq total) by mouth 2 (two) times daily. ([name] taking differently: Take 20 mEq by mouth daily. )  . predniSONE (DELTASONE) 10 MG tablet Take 10 mg by mouth daily.  . simvastatin (ZOCOR) 20 MG tablet Take 1 tablet (20 mg total) by mouth at bedtime. ([name] taking differently: Take 20 mg by mouth every morning. )  . [DISCONTINUED] losartan-hydrochlorothiazide (HYZAAR) 50-12.5 MG per tablet Take 1 tablet by mouth daily.  :  Review of Systems:  Out of a complete 14 point review of systems, all are reviewed and negative with John exception of these symptoms as listed below:   Review of Systems  All other systems reviewed and are negative.   Objective:  Neurologic Exam  Physical Exam Physical Examination:   Filed Vitals:   06/29/14 1055  BP: 133/77  Pulse: 61  Temp: 97.8 F (36.6 C)  Resp: 16   General Examination: John [name] is a very pleasant 69 y.o. male in no acute distress. John [name] looks well and is in great spirits today.  HEENT: Normocephalic, atraumatic, pupils are equal, round and reactive to light and accommodation. Funduscopic exam is normal with sharp disc margins noted. John [name] is status post bilateral cataract repairs. Extraocular tracking is good without nystagmus seen. Hearing is grossly intact. Face is symmetric with normal facial animation and normal facial sensation. Speech is better today. Slight hoarseness is noted. There is no lip, neck or jaw tremor. Neck is supple with full range of motion. There are no carotid bruits on auscultation. Oropharynx exam reveals moderate mouth dryness. No significant airway crowding is noted. Mallampati is class II. Tongue protrudes centrally and palate elevates symmetrically. Tongue movements are fine.   Chest: is clear to auscultation without wheezing, rhonchi or crackles  noted.  Heart: sounds are regular and normal without murmurs, rubs or gallops noted.   Abdomen: is soft, non-tender and non-distended with normal bowel sounds appreciated on auscultation.  Extremities: There is no pitting edema in John distal lower extremities bilaterally, mostly around John ankles, right more so than left and ankle joint is swollen on John right, fused, not new. Pedal pulses are intact. Arthritic changes are noted in both arms, left shoulder, ankles, both knees have scars.  Skin: is warm and dry with no trophic changes noted.  Musculoskeletal: exam reveals R hip pain and decreasing range of motion in both shoulders, left worse than right and some LBP.    Neurologically:  Mental status: John [name] is awake, paying good attention. John [name] is fully oriented, and able to give a fairly good history. His memory, attention, language and knowledge are normal today. John [name] does not have any word finding difficulties.    Cranial nerves are as described above under HEENT exam. Motor exam: Normal  bulk, and strength is globally 5-/5. Tone seems normal. There is no drift, or tremor, no rebound. Reflexes are 1+ throughout. Fine motor skills are mildly impaired, bilaterally. On finger to nose testing John [name] has very mild dysmetria bilaterally, right more than left with left almost normal. John [name] has no significant intention tremor. There is no truncal ataxia.  Sensory exam is intact to light touch in John UEs and LEs. Gait, station and balance: John [name] stands up with mild difficulty and only requires one attempt. His stance is mildly wide-based.  John [name] walks without assistance and slightly wide-based with a slight limp. John [name] may have uneven hip height. John [name] is unable to do tandem walk.   Assessment and Plan:   In summary, John [name] is a very pleasant 69 year old male with a complex medical history of hypertension, osteoarthritis, diverticulosis, hemachromatosis, coronary artery disease, s/p recent stenting, allergic  rhinitis,  low back pain, disabling rheumatoid arthritis, and COPD, who presents for a followup appointment for his recurrent headaches and gait disorder.  John [name] continues to look well today. John [name] is in great spirits and has had near complete resolution of his headaches. John [name] was treated last year for possible subdural empyema and had an extended period of IV antibiotics. John [name] is taking very little narcotic pain medications and am pleased to see this. John [name] has done very well in John last several months and is pleased with how John [name] is doing. John [name] has had resolution of his shortness of breath and chest pain since his coronary artery stenting on 06/17/2014. His medical history is quite complicated. John [name] has had multiple imaging tests in John last year. John [name] had altered mental status which improved. Unfortunately John [name] had significant issues in John interim with complications from lower back surgery including wound infection and a protracted course clinically. Paraneoplastic testing and screening for cancer in John past was negative, EEG normal, negative chest x-ray and negative CT abdomen and pelvis for any mass. John [name] has been taken off of his immune suppressant and there is no plan to put John [name] on Enbrel in John future as I understand. John [name] has a remote history of alcohol abuse and no longer drinks any alcohol at all. John [name] lives with his son and this is going well. John [name] is divorced and has a reasonable relationship with his ex-wife and has a fianc. John [name] is advised to continue to stay active mentally and physically. We talked about headache triggers. Management of stress, avoiding dehydration, avoiding hypoglycemia and good rest are key for preventing headaches. His balance and gait have improved. I encouraged John [name] to work on his exercise routine within his physical limitations of course. At this juncture, I would like to see John [name] back in 6 months, sooner if need be. I answered all his questions today and John [name] was in agreement.  I spent 15 minutes in total  face-to-face time with John [name], more than 50% of which was spent in counseling and coordination of care, reviewing test results, reviewing medication and discussing or reviewing John diagnosis of recurrent headaches, gait dysfunction, John prognosis and treatment options.

## 2014-06-29 NOTE — Patient Instructions (Signed)
Please remember to try to maintain good sleep hygiene, which means: Keep a regular sleep and wake schedule, try not to exercise or have a meal within 2 hours of your bedtime, try to keep your bedroom conducive for sleep, that is, cool and dark, without light distractors such as an illuminated alarm clock, and refrain from watching TV right before sleep or in the middle of the night and do not keep the TV or radio on during the night. Also, try not to use or play on electronic devices at bedtime, such as your cell phone, tablet PC or laptop. If you like to read at bedtime on an electronic device, try to dim the background light as much as possible. Do not eat in the middle of the night.    Please remember, common headache triggers are: sleep deprivation, dehydration, overheating, stress, hypoglycemia or skipping meals and blood sugar fluctuations, excessive pain medications or excessive alcohol use or caffeine withdrawal. Some people have food triggers such as aged cheese, orange juice or chocolate, especially dark chocolate, or MSG (monosodium glutamate). Try to avoid these headache triggers as much possible. It may be helpful to keep a headache diary to figure out what makes your headaches worse or brings them on and what alleviates them. Some people report headache onset after exercise but studies have shown that regular exercise may actually prevent headaches from coming. If you have exercise-induced headaches, please make sure that you drink plenty of fluid before and after exercising and that you do not over do it and do not overheat.

## 2014-07-21 ENCOUNTER — Other Ambulatory Visit: Payer: Self-pay | Admitting: Internal Medicine

## 2014-07-21 NOTE — Telephone Encounter (Signed)
Gabapentin rx refill sent to pharmacy

## 2014-08-18 ENCOUNTER — Other Ambulatory Visit: Payer: Self-pay | Admitting: Internal Medicine

## 2014-08-26 ENCOUNTER — Telehealth: Payer: Self-pay | Admitting: Cardiovascular Disease

## 2014-08-26 NOTE — Telephone Encounter (Signed)
According to pt he took his last dose of Plavix on 5/16.  Pt concerns shared with Flex NP and he agreed with surgeon.  Pt is to take four tablets of Plavix today and continue with routine of one 75 mg tablet daily. Recommendation shared with pt and he agreed with plan and verbalized understanding.

## 2014-08-26 NOTE — Telephone Encounter (Signed)
Yes should not interrupt plavix for a year complex overlapping stents to LAD in March

## 2014-08-26 NOTE — Telephone Encounter (Signed)
New message     Pt was at [facility] today on the table to have foot surgery.  The anesthesia asked some questions and found out pt had stents put in in march of this year and the pt stopped (on his own) his plavix.  His last dosage of plavix was last Monday.  They immediately cancelled the surgery, sent pt home.  They told him to take 3 or 4 plavix immediately.  He did not.  He want to ask Dr Johnsie Cancel first.  The surgeon at [facility] is Dr Kathie Dike' Orio.  Is number is [phone].  Please call pt and let him know what to do regarding plavix.

## 2014-09-02 ENCOUNTER — Encounter: Payer: Self-pay | Admitting: Internal Medicine

## 2014-09-02 ENCOUNTER — Ambulatory Visit (INDEPENDENT_AMBULATORY_CARE_PROVIDER_SITE_OTHER): Payer: Medicare Other | Admitting: Internal Medicine

## 2014-09-02 ENCOUNTER — Other Ambulatory Visit (INDEPENDENT_AMBULATORY_CARE_PROVIDER_SITE_OTHER): Payer: Medicare Other

## 2014-09-02 VITALS — BP 130/100 | HR 60 | Temp 97.5°F | Ht 67.0 in | Wt 164.5 lb

## 2014-09-02 DIAGNOSIS — I10 Essential (primary) hypertension: Secondary | ICD-10-CM

## 2014-09-02 DIAGNOSIS — E785 Hyperlipidemia, unspecified: Secondary | ICD-10-CM | POA: Diagnosis not present

## 2014-09-02 DIAGNOSIS — R634 Abnormal weight loss: Secondary | ICD-10-CM

## 2014-09-02 DIAGNOSIS — I2510 Atherosclerotic heart disease of native coronary artery without angina pectoris: Secondary | ICD-10-CM

## 2014-09-02 DIAGNOSIS — R490 Dysphonia: Secondary | ICD-10-CM

## 2014-09-02 DIAGNOSIS — M069 Rheumatoid arthritis, unspecified: Secondary | ICD-10-CM

## 2014-09-02 DIAGNOSIS — I4891 Unspecified atrial fibrillation: Secondary | ICD-10-CM

## 2014-09-02 DIAGNOSIS — I480 Paroxysmal atrial fibrillation: Secondary | ICD-10-CM

## 2014-09-02 DIAGNOSIS — D649 Anemia, unspecified: Secondary | ICD-10-CM

## 2014-09-02 DIAGNOSIS — R5382 Chronic fatigue, unspecified: Secondary | ICD-10-CM | POA: Diagnosis not present

## 2014-09-02 LAB — HEPATIC FUNCTION PANEL
ALT: 10 U/L (ref 0–53)
AST: 13 U/L (ref 0–37)
Albumin: 4.3 g/dL (ref 3.5–5.2)
Alkaline Phosphatase: 62 U/L (ref 39–117)
Bilirubin, Direct: 0.1 mg/dL (ref 0.0–0.3)
Total Bilirubin: 0.6 mg/dL (ref 0.2–1.2)
Total Protein: 7.1 g/dL (ref 6.0–8.3)

## 2014-09-02 LAB — BASIC METABOLIC PANEL
BUN: 26 mg/dL — ABNORMAL HIGH (ref 6–23)
CO2: 30 mEq/L (ref 19–32)
Calcium: 9.3 mg/dL (ref 8.4–10.5)
Chloride: 96 mEq/L (ref 96–112)
Creatinine, Ser: 1.03 mg/dL (ref 0.40–1.50)
GFR: 76.48 mL/min (ref >60.00)
Glucose, Bld: 107 mg/dL — ABNORMAL HIGH (ref 70–99)
Potassium: 4.1 mEq/L (ref 3.5–5.1)
Sodium: 132 mEq/L — ABNORMAL LOW (ref 135–145)

## 2014-09-02 LAB — CBC WITH DIFFERENTIAL/PLATELET
Basophils Absolute: 0 10*3/uL (ref 0.0–0.1)
Basophils Relative: 0.3 % (ref 0.0–3.0)
Eosinophils Absolute: 0 10*3/uL (ref 0.0–0.7)
Eosinophils Relative: 0.5 % (ref 0.0–5.0)
HCT: 42.7 % (ref 39.0–52.0)
Hemoglobin: 14.4 g/dL (ref 13.0–17.0)
Lymphocytes Relative: 27.9 % (ref 12.0–46.0)
Lymphs Abs: 2.4 10*3/uL (ref 0.7–4.0)
MCHC: 33.6 g/dL (ref 30.0–36.0)
MCV: 94.4 fl (ref 78.0–100.0)
Monocytes Absolute: 0.9 10*3/uL (ref 0.1–1.0)
Monocytes Relative: 10.8 % (ref 3.0–12.0)
Neutro Abs: 5.3 10*3/uL (ref 1.4–7.7)
Neutrophils Relative %: 60.5 % (ref 43.0–77.0)
Platelets: 242 10*3/uL (ref 150.0–400.0)
RBC: 4.53 Mil/uL (ref 4.22–5.81)
RDW: 13.4 % (ref 11.5–15.5)
WBC: 8.8 10*3/uL (ref 4.0–10.5)

## 2014-09-02 LAB — TSH: TSH: 1.85 u[IU]/mL (ref 0.35–4.50)

## 2014-09-02 LAB — IBC PANEL
Iron: 153 ug/dL (ref 42–165)
Saturation Ratios: 58.4 % — ABNORMAL HIGH (ref 20.0–50.0)
Transferrin: 187 mg/dL — ABNORMAL LOW (ref 212.0–360.0)

## 2014-09-02 MED ORDER — PRAVASTATIN SODIUM 20 MG PO TABS: 20.0000 mg | ORAL_TABLET | Freq: Every day | ORAL | Status: DC

## 2014-09-02 NOTE — Assessment & Plan Note (Signed)
7/15 CFS due to chronic illness

## 2014-09-02 NOTE — Assessment & Plan Note (Signed)
Medications: Lopressor

## 2014-09-02 NOTE — Assessment & Plan Note (Addendum)
Dr Johnsie Cancel Catherization July '06 - PCI/Stent 1st OM, 3/16 LAD STENT Medications: Lopressor, ASA, Plavix Pt is not taking Simvastatin for ?reason Start another statin - Pravachol

## 2014-09-02 NOTE — Assessment & Plan Note (Signed)
In NSR Metoprolol

## 2014-09-02 NOTE — Progress Notes (Signed)
Subjective:    HPI C/o neck pain x 2 weeks - pt saw Dr Joya Salm - a ?fx; myelogram is pending. Pt had Dilaudid from [facility] for surgery on R foot 516. However, R foot surgery at [facility] was cancelled due to h/o heart disease. Pt had an LAD STENT in 3/16.  F/u SOB - better on Breo.  F/u recent high potassium reading   F/u fatigue, sleeping a lot - better since he stopped am Gabapentin and Robaxin. In PT for LBP now On weekly Humira since 11/15  He saw Dr Melton Alar, Dr Verdie Mosher Readings from Last 3 Encounters:  09/02/14 164 lb 8 oz (74.617 kg)  06/29/14 166 lb (75.297 kg)  06/18/14 165 lb 9.1 oz (75.1 kg)     BP Readings from Last 3 Encounters:  09/02/14 130/100  06/29/14 133/77  06/18/14 169/93   (Pt's son left vm 7/3  stating pt is dizzy, can't walk or stand with unsteady gait. He states he sleeps all the time. [name] thinks some of his meds are affecting him and he doesn't need to be on all of them)  S/p recent LS surgery, sepsis, rehab - weak muscles....  [name] has had a very difficult time with multiple medical problems following back surgery - all well documented in the hospital record and previous notes. Since his last hospital d/c he has been living with his son. By their report he is off narcotics!! He is still having quite a bit of pain but is managing. He has lost a lot of weight but is currently on a good diet plus calorie supplement. He does report persistent Headache in a frontal-vertex/occipital distribution x 1 year.                          Review of Systems  Constitutional: Negative for appetite change, fatigue and unexpected weight change.  HENT: Positive for voice change. Negative for congestion, nosebleeds, sneezing, sore throat and trouble swallowing.   Eyes: Negative for itching and visual disturbance.  Respiratory: Negative for cough.   Cardiovascular: Negative for chest pain, palpitations and leg swelling.  Gastrointestinal: Negative for  nausea, vomiting, diarrhea, blood in stool and abdominal distention.  Genitourinary: Negative for frequency and hematuria.  Musculoskeletal: Positive for back pain, arthralgias, gait problem, neck pain and neck stiffness. Negative for joint swelling.  Skin: Negative for rash and wound.  Neurological: Negative for dizziness, tremors, speech difficulty, weakness, light-headedness and headaches.  Psychiatric/Behavioral: Positive for sleep disturbance. Negative for suicidal ideas, dysphoric mood and agitation. The patient is not nervous/anxious.        Objective:   Physical Exam  Constitutional: He is oriented to person, place, and time. He appears well-developed. No distress.  NAD  HENT:  Mouth/Throat: Oropharynx is clear and moist.  Eyes: Conjunctivae are normal. Pupils are equal, round, and reactive to light.  Neck: Normal range of motion. No JVD present. No thyromegaly present.  Cardiovascular: Normal rate, regular rhythm, normal heart sounds and intact distal pulses.  Exam reveals no gallop and no friction rub.   No murmur heard. Pulmonary/Chest: Effort normal and breath sounds normal. No respiratory distress. He has no wheezes. He has no rales. He exhibits no tenderness.  Abdominal: Soft. Bowel sounds are normal. He exhibits no distension and no mass. There is no tenderness. There is no rebound and no guarding.  Musculoskeletal: Normal range of motion. He exhibits no edema or tenderness.  Lymphadenopathy:  He has no cervical adenopathy.  Neurological: He is alert and oriented to person, place, and time. He has normal reflexes. No cranial nerve deficit. He exhibits normal muscle tone. He displays a negative Romberg sign. Coordination and gait normal.  No meningeal signs  Skin: Skin is warm and dry. No rash noted.  Psychiatric: He has a normal mood and affect. His behavior is normal. Judgment and thought content normal.  Looks well Chronic RA deformities A soft neck collar is  on   Lab Results  Component Value Date   WBC 6.0 06/18/2014   HGB 12.7* 06/18/2014   HCT 38.5* 06/18/2014   PLT 173 06/18/2014   GLUCOSE 91 06/18/2014   CHOL 139 12/31/2013   TRIG 44.0 12/31/2013   HDL 41.60 12/31/2013   LDLCALC 89 12/31/2013   ALT 18 12/31/2013   AST 16 12/31/2013   NA 139 06/18/2014   K 4.0 06/18/2014   CL 104 06/18/2014   CREATININE 0.97 06/18/2014   BUN 14 06/18/2014   CO2 28 06/18/2014   TSH 0.97 04/28/2014   PSA 1.95 10/15/2010   INR 1.1* 06/14/2014   HGBA1C 6.0 12/31/2013        Assessment & Plan:

## 2014-09-02 NOTE — Assessment & Plan Note (Signed)
On Prednisone only

## 2014-09-02 NOTE — Progress Notes (Signed)
Pre visit review using our clinic review tool, if applicable. No additional management support is needed unless otherwise documented below in the visit note. 

## 2014-09-03 LAB — VITAMIN D 25 HYDROXY (VIT D DEFICIENCY, FRACTURES): Vit D, 25-Hydroxy: 36 ng/mL (ref 30–100)

## 2014-09-11 ENCOUNTER — Other Ambulatory Visit: Payer: Self-pay | Admitting: Neurology

## 2014-09-12 ENCOUNTER — Other Ambulatory Visit: Payer: Self-pay | Admitting: Neurosurgery

## 2014-09-12 DIAGNOSIS — M542 Cervicalgia: Secondary | ICD-10-CM

## 2014-09-12 NOTE — Telephone Encounter (Signed)
Last OV note says :Headaches are very few now. He had an extended period of treatment with IV antibiotics and this in combination with taking Robaxin twice daily has helped him tremendously.  Prescribed by Jeani Hawking in Aug 2015 at [facility]

## 2014-09-14 ENCOUNTER — Telehealth: Payer: Self-pay | Admitting: Neurology

## 2014-09-14 MED ORDER — METHOCARBAMOL 500 MG PO TABS: 500.0000 mg | ORAL_TABLET | Freq: Two times a day (BID) | ORAL | Status: DC

## 2014-09-14 NOTE — Telephone Encounter (Signed)
Last OV note says: Headaches are very few now. He had an extended period of treatment with IV antibiotics and this in combination with taking Robaxin twice daily has helped him tremendously. He still takes gabapentin every day as well. Originally prescribed by [employer].  Rx sent.  Receipt confirmed by pharmacy.

## 2014-09-14 NOTE — Telephone Encounter (Signed)
[name] with [facility] called regarding the Rx. methocarbamol (ROBAXIN) 500 MG tablet. She would like to check on the status of the refill because she states that they have sent it over twice. Please call and advise.

## 2014-09-19 ENCOUNTER — Other Ambulatory Visit: Payer: Medicare Other

## 2014-09-19 ENCOUNTER — Telehealth: Payer: Self-pay | Admitting: Cardiovascular Disease

## 2014-09-19 ENCOUNTER — Other Ambulatory Visit: Payer: Self-pay | Admitting: Internal Medicine

## 2014-09-19 NOTE — Telephone Encounter (Signed)
New Message  Pt wanted to speak w/ Rn about a hold that (per pt) Dr. Johnsie Cancel is preventing pt from taking a test. Please call back and discuss.

## 2014-09-19 NOTE — Telephone Encounter (Signed)
He is now 3 months post multiple stents to LAD.  Still some risk to stopping DAT.  If he understands the risks and wants to proceed will send note to Dr Joya Salm to hold ASA and Plavix for 5 days for [facility] and resume as soon as possible after procedure  Note to Dr Joya Salm:  Patient has had complex stenting of LAD in March.  He insists on getting mylogram due to severe pain.  Ok to stop ASA and Plavix 5 days before mylogram and resume as soon as possible after procedure

## 2014-09-19 NOTE — Telephone Encounter (Signed)
Pt calling Dr Johnsie Cancel and nurse to inform them that Dr Joya Salm, Neurosurgeon, will be faxing over a clearance letter for the pt to come off of Plavix to get a myelogram done.  Pt states that he knows that Dr Johnsie Cancel stated there should be no interruptions of plavix for a year, complex overlapping stents to LAD in March.  Pt states that he absolutely needs Dr Kyla Balzarine approval to have the myelogram done, for he has excruciating pain in his neck, going down to his arm, and the only way to further explore this issue is to have a myelogram done by Dr. Joya Salm.  Informed the pt that Dr Johnsie Cancel and nurse are both out of the office today, but I will send them both a message for further review and recommendation of request and follow-up with the pt thereafter.  Confirmed with the pt that Dr Joya Salm has the correct fax information to send to our office. Pt verbalized understanding and agrees with this plan.

## 2014-09-19 NOTE — Telephone Encounter (Signed)
Notified pt that Dr. Johnsie Cancel said was OK to stop Plavix and ASA 5 days prior to [facility] as long as he understands the risks of stopping Plavix. Also a note from Dr. Johnsie Cancel and will forward to Dr. Joya Salm.  [name] states he will let Dr. Joya Salm decide if he wants him to stop the medications.

## 2014-09-20 NOTE — Telephone Encounter (Signed)
Notified pharmacy spoke with [facility] gave md approval.../lmb

## 2014-09-26 ENCOUNTER — Other Ambulatory Visit: Payer: Self-pay | Admitting: Internal Medicine

## 2014-10-15 ENCOUNTER — Other Ambulatory Visit: Payer: Self-pay | Admitting: Internal Medicine

## 2014-10-17 ENCOUNTER — Other Ambulatory Visit: Payer: Self-pay | Admitting: Internal Medicine

## 2014-10-17 NOTE — Telephone Encounter (Signed)
Ok to Rf? Med was d/c on med list.

## 2014-10-27 ENCOUNTER — Telehealth: Payer: Self-pay | Admitting: Internal Medicine

## 2014-10-27 ENCOUNTER — Telehealth: Payer: Self-pay

## 2014-10-27 NOTE — Telephone Encounter (Signed)
A user error has taken place.

## 2014-10-27 NOTE — Telephone Encounter (Signed)
[employer]   pantoprazole - it has been approved  1 year as of October 27 2014

## 2014-10-27 NOTE — Telephone Encounter (Signed)
PA Approved. Via covermymeds. Pharmacy notified.

## 2014-10-27 NOTE — Telephone Encounter (Signed)
PA initiated via covermymeds KEY: [id number] Script: Pantoprazole Sod Dr 40 MG tab

## 2014-11-28 ENCOUNTER — Encounter: Payer: Self-pay | Admitting: Neurology

## 2014-11-28 ENCOUNTER — Ambulatory Visit (INDEPENDENT_AMBULATORY_CARE_PROVIDER_SITE_OTHER): Payer: Medicare Other | Admitting: Neurology

## 2014-11-28 VITALS — BP 140/78 | HR 68 | Resp 16 | Ht 67.0 in | Wt 166.0 lb

## 2014-11-28 DIAGNOSIS — R269 Unspecified abnormalities of gait and mobility: Secondary | ICD-10-CM

## 2014-11-28 DIAGNOSIS — R51 Headache: Secondary | ICD-10-CM

## 2014-11-28 DIAGNOSIS — R519 Headache, unspecified: Secondary | ICD-10-CM

## 2014-11-28 MED ORDER — METHOCARBAMOL 500 MG PO TABS: 500.0000 mg | ORAL_TABLET | Freq: Two times a day (BID) | ORAL | Status: DC

## 2014-11-28 NOTE — Progress Notes (Signed)
Subjective:    Patient ID: [name] is a 69 y.o. male.  HPI     Interim history:   [name] is a 69 year old right-handed gentleman with an underlying complex medical history of hypertension, osteoarthritis, diverticulosis, hemachromatosis, coronary artery disease, allergic rhinitis, rheumatoid arthritis, and COPD, who presents for followup consultation of his gait disorder, and recurrent headaches. He is unaccompanied today. I last saw him on 06/29/2014, at which time he reported feeling quite well. He was trying to exercise regularly. He was still having significant physical limitations because of residual back pain and his ongoing and long-standing history of rheumatoid arthritis. He was not always drinking enough water. He was taking Benadryl for sleep each night. Overall however, he felt that he was sleeping better. He had very few headaches. He felt that the IV anti-body aches and symptomatic medications for his back pain had helped. He was taking gabapentin every day. This was from when he had back surgery years ago. He was taking baby aspirin, Plavix, Aleve as needed and very rarely Nucynta, once or 2 times a week, and Robaxin bid. He reported no chest pain or shortness of breath.   Today, 11/28/2014: He reports doing well. He has been doing well since his stenting. He will likely need R ankle surgery. He sees ortho at [facility]. He has neck pain and has seen Dr. Joya Salm. He had neck imaging which per his report showed problems with the disc and a fracture for which she may need surgery. He has occasional radiating pain and numbness from his neck. Very rarely does he take hydrocodone or oxycodone for pain. He tries to limit narcotic pain medication for flareup of pain and only take it for 2 days at a time. He had full ankle replacement to the right 10 years ago and was told at the time that it will wear out. His cardiologist has advised him to wait at least 6 months or 12 months for any  elective surgeries after his stent placement. This was in March 2016.  Previously:   I saw him on 01/28/2014, at which time he reported doing well. His headaches resolved after he was placed on vancomycin. He was off of Humira and did not start Enbrel per rheumatology. He was on Nucynta once daily. He was overall doing well since his last hospitalization. I did not suggest any new medications. In the interim, he had a follow-up with his cardiologist. He was reporting exertional chest pain. He had a stress test in February 2016 which I reviewed which was fairly benign. He did have a left heart catheter and had stenting of the LAD on 06/17/2014. I reviewed the hospital records. He was placed on dual antiplatelet therapy which she is supposed to continue for 12 months.  I saw him on 09/20/2013, at which time he presented for a sooner than scheduled appointment for new onset headaches. I had seen him before for altered mental status and balance problems as well as ataxia. He reported at the last visit new onset headaches since March 2015. These are typically right-sided. He had tried Imitrex per primary care provider and had also been given IV Depacon which helped. He was seen in ENT. He had gone through speech therapy. In the interim, he was seen by our nurse practitioner, Jeani Hawking on 11/18/2013 for a sooner than scheduled appointment because of an acute severe headache. He was found to have a fever of 101.7 orally. We sent him to the emergency room in  light of his fever, history of immune suppressant use, severe headache associated with nausea and also neck stiffness. He was treated for possible subdural empyema with IV antibiotics and discharged on oral antibiotics. MRI brain without contrast on 11/18/2013 showed no new findings and chronic changes. MRI lumbar spine with and without contrast on 11/18/2013 showed: Enhancing debris in the dependent portion of the thecal sac and upper sacral subdural space consistent  with postoperative infection/meningitis/subdural empyema. This has significantly progressed from February 2015. Surgical consultation is warranted. Slight enhancement of the L3-4 disc space has progressed from priors, equivocal for diskitis.    I saw him on 07/26/2013, at which time we discussed his multiple recent hospitalizations. He reported problems with hoarseness and softer speech and I referred him to ENT. I also ordered a swallow study. This was done on 08/11/2013 and showed mild sensory base pharyngeal dysphagia, delayed swallow reflex, penetration and aspiration of thin and nectar thick liquids with no reflexive cough, compensatory positioning with chin talk was ineffective, however individual small boluses decreased penetration and aspiration. He was advised to followup with speech therapy. I made a referral in that regard. The patient called on 09/06/2013 reporting a two-week headache. Dr. Brett Fairy saw him on my behalf as I was not in the office that week, and he was treated in our office with Depacon infusion as well as IV Solu-Medrol, 500 mg each. He improved. Dr. Brett Fairy ordered a repeat brain MRI and he had a brain MRI with and without contrast on 09/18/2013: Abnormal MRI scan of the brain showing mild changes of chronic microvascular ischemia and generalized cerebral atrophy. Overall no significant change compared with previous MRI scan dated 06/30/2012. In addition, personally reviewed the images through the PACS system.   I saw him on 12/15/2012, at which time we talked about previous test results again. We talked about his hospitalization from April 2014. Overall he had improved at the time. Workup had shown increased CSF protein, increased IgG index, positive oligoclonal bands. This of course in the context of autoimmune disease, namely rheumatoid arthritis, on Humira. EEG was negative. We looked for cancer with screening CT abdomen pelvis and chest x-ray. He has been on immune suppressant,  Humira. Brain MRI did not show any demyelinating process or mass or enhancing lesions. I considered a second opinion at a larger [facility] and a repeat brain MRI. His last CTH was on 06/30/13: Involutional and chronic changes without evidence of focal or acute abnormalities.   On 03/02/2013 he had lower spine surgery with decompression of the thecal sac and facetectomy, L2-3 discectomy, removal of herniated disc, placement of pedicle screws L1-3 and S1, fusion from L1-S1 involving the L4-5 fusion; this was under Dr. Joya Salm. Postoperatively he developed hypotension. He was discharged on 03/09/2013 to a skilled nursing facility. On 03/16/2013 he developed fever and vomiting and was readmitted under neurosurgery. He was found to have hypovolemia, hyponatremia and elevation in BUN and creatinine. On 03/17/2013 he had revision of a lumbar wound.   On 03/19/2013 he was seen by infectious disease for a lumbar wound infection and treatment thereof. He was discharged to home on 04/06/2013 after being treated for a wound infection with IV antibiotics. He presented on 04/13/2013 with fever and hypotension and was sent to the emergency room for further workup and treatment. ENT was consulted for hoarseness and GI was consulted for concern for small bowel obstruction which was treated conservatively. He was discharged on 04/24/2013. He has had multiple falls with  and without injuries, most recently a laceration to his face on 07/01/2013. He has had [place] physical therapy and speech therapy. He lives with his son currently. He is supposed to have 24 hour supervision and has not been fully compliant with the use of assistive devices as recommended by home health PT.   He was admitted on 05/06/2013 for AMS, deemed secondary to polypharmacy including opioid overdose, presenting with mental status changes, acute renal failure, acute respiratory failure with hypoxia. He was discharged on 05/09/2013.   He was admitted on  05/23/2013 with generalized weakness, fever, nausea and vomiting. He was transferred to inpatient rehabilitation on 05/27/2013. He was discharged on 06/10/2013.   He was admitted on 06/16/2013 for altered mental status. He was discharged on 06/22/2013. He presented to the emergency room on 06/30/2013 for fever. He presented to the emergency room on 07/01/2013 for a fall.   I saw him on 10/02/2012 after a recent hospitalization.   I first met him on 07/14/2012 at the request of Dr. Ronnald Ramp. He has an underlying complex medical history including rheumatoid arthritis, osteoarthritis, COPD, atrial fibrillation, anemia, hypertension, heart disease, reported fairly sudden onset unsteadiness of his gait, vertigo, dizziness, blurry vision since early March 2014 there was no significant prodromal illness. His wife had noted some slurring of speech. Head CT on 06/22/2012 showed no acute abnormality. 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 He also reported some nausea for which meclizine and scopolamine patch helped. I arranged for a hospital admission from clinic because of concerns with cerebellar dysfunction including dysarthria, nystagmus, but also some alteration in his mental status. The patient was not fully oriented and was sluggish in his responses and seemed confused, talking out of context at times. He had mild dysmetria bilaterally.   CSF showed: increased protein, increased IgG index and positive oligoclonal bands in his CSF. His altered mental status improved and his exam was better and he benefited from physical therapy which he completed. I felt that he had a variety of differential diagnoses possible including cerebellitis, and cephalitis, metabolic-toxic or other immune-paraneoplastic causes of his presentation. I recommended paraneoplastic testing. EEG was normal, chest x-ray was negative, CT abdomen and pelvis were negative. Of note, he has  been on immune suppression with Humira. MRI did not show any demyelinating lesions.   His paraneoplastic testing was negative in July 2014. He had foot surgery on the right. He has been on Humira for the past 2 years, [place] injections every other week.    His Past Medical History Is Significant For: Past Medical History  Diagnosis Date  . Hx of colonic polyps   . Diverticulosis   . Hypertension   . Hemochromatosis     dx'd 14 yrs ago last ferritin Aug 11, 08 52 (22-322), Fe 136 ("I had 250 phlebotomies for that")  . CAD (coronary artery disease)     minimal coronary plaque in the LAD and right coronary system. 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 7-06. Last nuclear stress 11-17-06 fixed anterior/ inferior defect, no inducible ischemia, EF 81%  . Allergic rhinitis   . Hx of colonoscopy   . Depression   . GERD (gastroesophageal reflux disease)   . Chronic back pain     "all over back"  . PONV (postoperative nausea and vomiting)   . High cholesterol     hx  . Falls frequently     "  since 02/2013" (06/16/2013)  . Anxiety   . Narcotic abuse     per family  . Alcoholism /alcohol abuse     per family  . Bacterial infection   . Myocardial infarction 2006    "related to catheterization"  . Dysrhythmia 01-24-12    past hx. A.Fib x1 episode-responded to med.  [name] COPD (chronic obstructive pulmonary disease)   . Sleep apnea     "never dx'd;" (06/17/2014))  . Migraine     "for 3 months in 2015; related to infection in my back" (06/17/2014)  . Osteoarthritis   . RA (rheumatoid arthritis)     His Past Surgical History Is Significant For: Past Surgical History  Procedure Laterality Date  . Appendectomy  ~ 1956  . Total knee arthroplasty Bilateral 2002  . Total shoulder replacement Left 2006  . Foot surgery Right 11-08    for removal of bone spurs-  . Ankle reconstruction Right 6-09    [facility]  . Posterior lumbar fusion   12-10    L4-5 diskectomy w/ fusion, cage placement and rods; [place]  . Replacement unicondylar joint knee Left ~ 2003    "~ 6 months after total knee replaced"  . Total shoulder replacement Right ~ 2007    Dr. Marlou Sa  . Joint replacement    . Cataract extraction, bilateral Bilateral 01-24-12  . Orif shoulder fracture  02/06/2012    Procedure: OPEN REDUCTION INTERNAL FIXATION (ORIF) SHOULDER FRACTURE;  Surgeon: Nita Sells, MD;  Location: [facility];  Service: Orthopedics;  Laterality: Left;  ORIF of a Left Shoulder Fracture with  Iliac Crest Bone Graft aspiration   . Harvest bone graft  02/06/2012    Procedure: HARVEST ILIAC BONE GRAFT;  Surgeon: Nita Sells, MD;  Location: [facility];  Service: Orthopedics;;  bone marrow aspirqation   . Hardware removal  03/09/2012    Procedure: HARDWARE REMOVAL;  Surgeon: Nita Sells, MD;  Location: [place];  Service: Orthopedics;  Laterality: Left;  Hardware Removal from Left Shoulder  . Carpal tunnel release Right 1990's  . Total ankle replacement Right 2008    at [facility]  . Hammer toe surgery Right 07/2012    "broke 4 hammertoes"   . Posterior lumbar fusion 4 level N/A 03/02/2013    Procedure: Lumbar One to Sacral One Posterior lumbar interbody fusion;  Surgeon: Floyce Stakes, MD;  Location: [place] [facility];  Service: Neurosurgery;  Laterality: N/A;  L1 to S1 Posterior lumbar interbody fusion  . Lumbar wound debridement N/A 03/17/2013    Procedure: Incision and drainage of superficial lumbar wound;  Surgeon: Floyce Stakes, MD;  Location: [place] [facility];  Service: Neurosurgery;  Laterality: N/A;  Incision and drainage of superficial lumbar wound  . Coronary angioplasty with stent placement  2006    "while repairing 1st stent, a second area tore and they had to place 2nd stent " ?LAD & CX  . Coronary angioplasty with stent placement  06/17/2014  . Inguinal hernia repair Bilateral   . Fracture surgery    . Bone  tumor resection  ~ 1954    "taken off my mastoid"  . Meckel diverticulum excision  ~ 1956  . Abdominal adhesion surgery  ~ 1968  . Knee surgery Left ~ 2003    "6-12 months after uni knee removed synovial sack"  . Cyst excision  "several OR's"    "backX 2, back of my neck, face, inside right bicept, chest, wrist"  .  Shoulder arthroscopy Left ~ 2004 X 2    "@ [facility]; left bone splinter in & had to clean it out"  . Left heart catheterization with coronary angiogram N/A 06/17/2014    PCI of diffuse severe stenosis in the proximal to mid LAD using overlapping drug-eluting stents.    His Family History Is Significant For: Family History  Problem Relation Age of Onset  . Uterine cancer Mother     survivor  . Macular degeneration Mother   . Other Mother     ankle edema  . Lung cancer Mother   . Cancer Mother     ovarian, lung  . Coronary artery disease Father   . Hypertension Father   . Prostate cancer Father   . Colon polyps Father   . Cancer Father     prostate  . Thyroid disease Sister   . Heart attack Brother   . Hyperlipidemia Brother   . Other Brother     Schizophrenic  . Diabetes Neg Hx   . Colon cancer Neg Hx   . Esophageal cancer Neg Hx   . Rectal cancer Neg Hx   . Stomach cancer Neg Hx   . Coronary artery disease Maternal Aunt   . Heart attack Maternal Aunt   . Rheum arthritis Sister   . Hemochromatosis Sister     His Social History Is Significant For: Social History   Social History  . Marital Status: Divorced    Spouse Name: N/A  . Number of Children: 2  . Years of Education: 16   Occupational History  . 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 Smoking status: Never Smoker   . Smokeless tobacco: Never Used  . Alcohol Use: No     Comment: "I do not drink anymore"  . Drug Use: No  . Sexual Activity:    Partners: Female   Other Topics Concern  . None   Social History Narrative   [employer] - BA. Married 1972-25yr  seperated/divorced. Engaged April '11. 1 son '74; step-daughter '70. 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 During '11 discovered an [employer]- $500,000+ loss. Is handling this OK - will keep the business running.    His Allergies Are:  Allergies  Allergen Reactions  . Cefepime Hives and Shortness Of Breath  . Morphine And Related Shortness Of Breath, Nausea And Vomiting, Swelling and Other (See Comments)    Agitation, tolerates dilaudid  . Penicillins Hives, Shortness Of Breath and Rash    REACTION: hives, breathing problems  . Percocet [Oxycodone-Acetaminophen] Anxiety    Takes OxyContin at home.  :   His Current Medications Are:  Outpatient Encounter Prescriptions as of 11/28/2014  Medication Sig  . albuterol (PROVENTIL HFA;VENTOLIN HFA) 108 (90 BASE) MCG/ACT inhaler Inhale 2 puffs into the lungs every 6 (six) hours as needed for wheezing or shortness of breath.  . ALPRAZolam (XANAX) 0.5 MG tablet TAKE 1 TABLET BY MOUTH TWICE A DAY AS NEEDED FOR ANXIETY  . aspirin (ASPIRIN EC) 81 MG EC tablet Take 1 tablet (81 mg total) by mouth every morning.  . cholecalciferol (VITAMIN D) 1000 UNITS tablet Take 1 tablet (1,000 Units total) by mouth daily.  . clopidogrel (PLAVIX) 75 MG tablet Take 1 tablet (75 mg total) by mouth daily with breakfast.  . FLUoxetine (PROZAC) 40 MG capsule TAKE ONE CAPSULE  BY MOUTH TWICE A DAY  . fluticasone (FLONASE) 50 MCG/ACT nasal spray PLACE 2 SPRAYS INTO THE NOSE DAILY.  [name] gabapentin (NEURONTIN) 300 MG capsule TAKE 2 CAPSULES BY MOUTH IN THE MORNING, 1 CAPSULE MIDDAY, AND 2 CAPSULES AT BEDTIME  . HYDROmorphone (DILAUDID) 2 MG tablet Take 1-2 tablets by mouth every 3-4 hours for breakthrough pain.  Wean as able.  . losartan-hydrochlorothiazide (HYZAAR) 50-12.5 MG per tablet TAKE 1 TABLET BY MOUTH DAILY  . methocarbamol (ROBAXIN) 500 MG tablet  Take 1 tablet (500 mg total) by mouth 2 (two) times daily.  . metoprolol tartrate (LOPRESSOR) 25 MG tablet TAKE 1 TABLET BY MOUTH TWICE A DAY  . nitroGLYCERIN (NITROSTAT) 0.4 MG SL tablet Place 1 tablet (0.4 mg total) under the tongue every 5 (five) minutes as needed for chest pain.  [name] ondansetron (ZOFRAN) 4 MG tablet Take 1 tablet (4 mg total) by mouth every 8 (eight) hours as needed for nausea or vomiting.  . Oxycodone HCl 10 MG TABS TAKE 1/2 TO 1 TABLET EVERY 4 TO 6 HOURS AS NEEDED  . pantoprazole (PROTONIX) 40 MG tablet TAKE 1 TABLET BY MOUTH TWICE A DAY  . Polyethyl Glycol-Propyl Glycol 0.4-0.3 % SOLN Apply 2 drops to eye daily.  . pravastatin (PRAVACHOL) 20 MG tablet Take 1 tablet (20 mg total) by mouth daily.  . predniSONE (DELTASONE) 5 MG tablet TAKE (2) TABLETS BY MOUTH ONCE A DAY  . senna (SENOKOT) 8.6 MG tablet Take by mouth.  . [DISCONTINUED] potassium chloride SA (KLOR-CON M20) 20 MEQ tablet Take 1 tablet (20 mEq total) by mouth 2 (two) times daily. (Patient taking differently: Take 20 mEq by mouth daily. )  . [DISCONTINUED] predniSONE (DELTASONE) 10 MG tablet Take 10 mg by mouth daily.   No facility-administered encounter medications on file as of 11/28/2014.  :  Review of Systems:  Out of a complete 14 point review of systems, all are reviewed and negative with the exception of these symptoms as listed below:   Review of Systems  Neurological:       Patient denies having any headaches since last visit.   All other systems reviewed and are negative.   Objective:  Neurologic Exam  Physical Exam Physical Examination:   Filed Vitals:   11/28/14 1122  BP: 140/78  Pulse: 68  Resp: 16   General Examination: The patient is a very pleasant 69 y.o. male in no acute distress. He looks well and is in good spirits today.  HEENT: Normocephalic, atraumatic, pupils are equal, round and reactive to light and accommodation. He is status post bilateral cataract repairs. Extraocular  tracking is good without nystagmus seen. Hearing is grossly intact. Face is symmetric with normal facial animation and normal facial sensation. Speech is better today. Slight hoarseness is noted. There is no lip, neck or jaw tremor. Neck is supple with full range of motion. There are no carotid bruits on auscultation. Oropharynx exam reveals moderate mouth dryness. No significant airway crowding is noted. Mallampati is class II. Tongue protrudes centrally and palate elevates symmetrically. Tongue movements are fine.   Chest: is clear to auscultation without wheezing, rhonchi or crackles noted.  Heart: sounds are regular and normal without murmurs, rubs or gallops noted.   Abdomen: is soft, non-tender and non-distended with normal bowel sounds appreciated on auscultation.  Extremities: There is no pitting edema in the distal lower extremities bilaterally, mostly around the ankles, right more so than left and ankle joint is swollen on the right,  fused, not new. Pedal pulses are intact. Arthritic changes are noted in both arms, hands and fingers, left shoulder, ankles, both knees have scars from knee replacement surgeries.  Skin: is warm and dry with no trophic changes noted.  Musculoskeletal: exam reveals some neck pain and decrease in range of motion in both shoulders, and R ankle.    Neurologically:  Mental status: The patient is awake, paying good attention. He is fully oriented, and able to give a fairly good history. His memory, attention, language and knowledge are normal today. He does not have any word finding difficulties.    Cranial nerves are as described above under HEENT exam. Motor exam: Normal bulk, and strength is globally 5-/5. Tone seems normal. There is no drift, or tremor, no rebound. Reflexes are 1+ throughout. Fine motor skills are mildly impaired, bilaterally. On finger to nose testing he has very mild dysmetria bilaterally, right more than left with left almost normal. He has no  significant intention tremor. There is no truncal ataxia.  Sensory exam is intact to light touch, PP and temperature in the UEs and LEs. Gait, station and balance: He stands up with mild difficulty and only requires one attempt. His stance is mildly wide-based.  He walks without assistance and slightly wide-based with a slight limp. He may have uneven hip height. He is unable to do tandem walk, unchanged.   Assessment and Plan:   In summary, MATTIA LIFORD is a very pleasant 69 year old male with a complex medical history of hypertension, osteoarthritis, diverticulosis, hemachromatosis, coronary artery disease, s/p recent stent placements (most recent in 3/16), allergic rhinitis, upper and lower back pain, disabling rheumatoid arthritis, and COPD, who presents for a followup appointment for his recurrent headaches and gait disorder. He has been doing well and reports no significant recurrence of his headaches since last visit. He was treated last year for possible subdural empyema and had an extended period of IV antibiotics. He is taking infrequent narcotic pain medications and am pleased to see this. He has done very well in the last several months and is pleased with how he is doing. Nevertheless, he has residual arthritis pain and may need right ankle surgery or revision of his previous right ankle joint replacement surgery. He has had resolution of his shortness of breath and chest pain since his coronary artery stenting on 06/17/2014. His medical history is quite complicated. He has had multiple imaging tests in the last year. He had altered mental status which improved. Unfortunately he had significant issues in the interim with complications from lower back surgery including wound infection and a protracted course clinically. Paraneoplastic testing and screening for cancer in the past was negative, EEG normal, negative chest x-ray and negative CT abdomen and pelvis for any mass. He has been taken  off of his immune suppressant and there is no plan to put him on Enbrel in the future as I understand. He has a remote history of alcohol abuse and no longer drinks any alcohol at all. He lives with his son and this is going well. He is divorced and has a reasonable relationship with his ex-wife and has a fianc. He is advised to continue to stay active mentally and physically. We talked about headache triggers. Management of stress, avoiding dehydration, avoiding hypoglycemia and good rest are key for preventing headaches. His balance and gait have improved. I encouraged him to work on his exercise routine within his physical limitations of course. At this juncture, I  would like to see him back in 6 to 8 months, sooner if need be. I answered all his questions today and he was in agreement.  I spent 15 minutes in total face-to-face time with the patient, more than 50% of which was spent in counseling and coordination of care, reviewing test results, reviewing medication and discussing or reviewing the diagnosis of recurrent headaches, gait dysfunction, the prognosis and treatment options.

## 2014-11-28 NOTE — Patient Instructions (Addendum)
We will continue to monitor your symptoms.  Please remember, common headache triggers are: sleep deprivation, dehydration, overheating, stress, hypoglycemia or skipping meals and blood sugar fluctuations, excessive pain medications or excessive alcohol use or caffeine withdrawal. Some people have food triggers such as aged cheese, orange juice or chocolate, especially dark chocolate, or MSG (monosodium glutamate). Try to avoid these headache triggers as much possible. It may be helpful to keep a headache diary to figure out what makes your headaches worse or brings them on and what alleviates them. Some people report headache onset after exercise but studies have shown that regular exercise may actually prevent headaches from coming. If you have exercise-induced headaches, please make sure that you drink plenty of fluid before and after exercising and that you do not over do it and do not overheat. I can see you back in 8 months.

## 2014-12-05 ENCOUNTER — Other Ambulatory Visit (INDEPENDENT_AMBULATORY_CARE_PROVIDER_SITE_OTHER): Payer: Medicare Other

## 2014-12-05 ENCOUNTER — Ambulatory Visit (INDEPENDENT_AMBULATORY_CARE_PROVIDER_SITE_OTHER): Payer: Medicare Other | Admitting: Internal Medicine

## 2014-12-05 ENCOUNTER — Encounter: Payer: Self-pay | Admitting: Internal Medicine

## 2014-12-05 VITALS — BP 150/90 | HR 65 | Wt 168.0 lb

## 2014-12-05 DIAGNOSIS — R5382 Chronic fatigue, unspecified: Secondary | ICD-10-CM

## 2014-12-05 DIAGNOSIS — J449 Chronic obstructive pulmonary disease, unspecified: Secondary | ICD-10-CM

## 2014-12-05 DIAGNOSIS — I10 Essential (primary) hypertension: Secondary | ICD-10-CM | POA: Diagnosis not present

## 2014-12-05 DIAGNOSIS — Z23 Encounter for immunization: Secondary | ICD-10-CM | POA: Diagnosis not present

## 2014-12-05 DIAGNOSIS — I2510 Atherosclerotic heart disease of native coronary artery without angina pectoris: Secondary | ICD-10-CM | POA: Diagnosis not present

## 2014-12-05 DIAGNOSIS — R7302 Impaired glucose tolerance (oral): Secondary | ICD-10-CM

## 2014-12-05 LAB — BASIC METABOLIC PANEL
BUN: 20 mg/dL (ref 6–23)
CO2: 30 mEq/L (ref 19–32)
Calcium: 9 mg/dL (ref 8.4–10.5)
Chloride: 101 mEq/L (ref 96–112)
Creatinine, Ser: 1.06 mg/dL (ref 0.40–1.50)
GFR: 73.93 mL/min (ref >60.00)
Glucose, Bld: 117 mg/dL — ABNORMAL HIGH (ref 70–99)
Potassium: 4.3 mEq/L (ref 3.5–5.1)
Sodium: 137 mEq/L (ref 135–145)

## 2014-12-05 LAB — TSH: TSH: 0.76 u[IU]/mL (ref 0.35–4.50)

## 2014-12-05 MED ORDER — VITAMIN D3 50 MCG (2000 UT) PO CAPS: 2000.0000 [IU] | ORAL_CAPSULE | Freq: Every day | ORAL | Status: DC

## 2014-12-05 MED ORDER — ALBUTEROL SULFATE 108 (90 BASE) MCG/ACT IN AEPB: 1.0000 | INHALATION_SPRAY | Freq: Four times a day (QID) | RESPIRATORY_TRACT | Status: DC | PRN

## 2014-12-05 NOTE — Progress Notes (Signed)
Pre visit review using our clinic review tool, if applicable. No additional management support is needed unless otherwise documented below in the visit note. 

## 2014-12-05 NOTE — Assessment & Plan Note (Signed)
Labs

## 2014-12-05 NOTE — Progress Notes (Signed)
Subjective:  Patient ID: [name], male    DOB: [birth year]  Age: 69 y.o. MRN: [id number]  CC: No chief complaint on file.   HPI TYREK LAWHORN presents for HTN, RA, anxiety. Pt has less stress. No ETOH  Outpatient Prescriptions Prior to Visit  Medication Sig Dispense Refill  . albuterol (PROVENTIL HFA;VENTOLIN HFA) 108 (90 BASE) MCG/ACT inhaler Inhale 2 puffs into the lungs every 6 (six) hours as needed for wheezing or shortness of breath. 1 Inhaler 11  . ALPRAZolam (XANAX) 0.5 MG tablet TAKE 1 TABLET BY MOUTH TWICE A DAY AS NEEDED FOR ANXIETY 60 tablet 3  . aspirin (ASPIRIN EC) 81 MG EC tablet Take 1 tablet (81 mg total) by mouth every morning. 30 tablet 12  . cholecalciferol (VITAMIN D) 1000 UNITS tablet Take 1 tablet (1,000 Units total) by mouth daily. 100 tablet 3  . clopidogrel (PLAVIX) 75 MG tablet Take 1 tablet (75 mg total) by mouth daily with breakfast. 30 tablet 11  . FLUoxetine (PROZAC) 40 MG capsule TAKE ONE CAPSULE BY MOUTH TWICE A DAY 60 capsule 5  . fluticasone (FLONASE) 50 MCG/ACT nasal spray PLACE 2 SPRAYS INTO THE NOSE DAILY. 16 g 4  . gabapentin (NEURONTIN) 300 MG capsule TAKE 2 CAPSULES BY MOUTH IN THE MORNING, 1 CAPSULE MIDDAY, AND 2 CAPSULES AT BEDTIME 150 capsule 5  . losartan-hydrochlorothiazide (HYZAAR) 50-12.5 MG per tablet TAKE 1 TABLET BY MOUTH DAILY 90 tablet 3  . methocarbamol (ROBAXIN) 500 MG tablet Take 1 tablet (500 mg total) by mouth 2 (two) times daily. 60 tablet 5  . metoprolol tartrate (LOPRESSOR) 25 MG tablet TAKE 1 TABLET BY MOUTH TWICE A DAY 60 tablet 11  . nitroGLYCERIN (NITROSTAT) 0.4 MG SL tablet Place 1 tablet (0.4 mg total) under the tongue every 5 (five) minutes as needed for chest pain. 20 tablet 3  . ondansetron (ZOFRAN) 4 MG tablet Take 1 tablet (4 mg total) by mouth every 8 (eight) hours as needed for nausea or vomiting. 20 tablet 1  . Oxycodone HCl 10 MG TABS TAKE 1/2 TO 1 TABLET EVERY 4 TO 6 HOURS AS NEEDED  0  .  pantoprazole (PROTONIX) 40 MG tablet TAKE 1 TABLET BY MOUTH TWICE A DAY 60 tablet 11  . Polyethyl Glycol-Propyl Glycol 0.4-0.3 % SOLN Apply 2 drops to eye daily.    . pravastatin (PRAVACHOL) 20 MG tablet Take 1 tablet (20 mg total) by mouth daily. 30 tablet 11  . predniSONE (DELTASONE) 5 MG tablet TAKE (2) TABLETS BY MOUTH ONCE A DAY  3  . senna (SENOKOT) 8.6 MG tablet Take by mouth.    [name] HYDROmorphone (DILAUDID) 2 MG tablet Take 1-2 tablets by mouth every 3-4 hours for breakthrough pain.  Wean as able.     No facility-administered medications prior to visit.    ROS Review of Systems  Constitutional: Positive for fatigue. Negative for fever, appetite change and unexpected weight change.  HENT: Negative for congestion, nosebleeds, sneezing, sore throat and trouble swallowing.   Eyes: Negative for itching and visual disturbance.  Respiratory: Negative for cough.   Cardiovascular: Negative for chest pain, palpitations and leg swelling.  Gastrointestinal: Negative for nausea, diarrhea, blood in stool and abdominal distention.  Genitourinary: Negative for frequency and hematuria.  Musculoskeletal: Positive for arthralgias and gait problem. Negative for back pain, joint swelling and neck pain.  Skin: Negative for rash.  Neurological: Negative for dizziness, tremors, speech difficulty and weakness.  Psychiatric/Behavioral: Negative for suicidal ideas,  sleep disturbance, dysphoric mood and agitation. The patient is nervous/anxious.     Objective:  BP 150/90 mmHg  Pulse 65  Wt 168 lb (76.204 kg)  SpO2 96%  BP Readings from Last 3 Encounters:  12/05/14 150/90  11/28/14 140/78  09/02/14 130/100    Wt Readings from Last 3 Encounters:  12/05/14 168 lb (76.204 kg)  11/28/14 166 lb (75.297 kg)  09/02/14 164 lb 8 oz (74.617 kg)    Physical Exam  Constitutional: He is oriented to person, place, and time. He appears well-developed. No distress.  NAD  HENT:  Mouth/Throat: Oropharynx is  clear and moist.  Eyes: Conjunctivae are normal. Pupils are equal, round, and reactive to light.  Neck: Normal range of motion. No JVD present. No thyromegaly present.  Cardiovascular: Normal rate, regular rhythm, normal heart sounds and intact distal pulses.  Exam reveals no gallop and no friction rub.   No murmur heard. Pulmonary/Chest: Effort normal and breath sounds normal. No respiratory distress. He has no wheezes. He has no rales. He exhibits no tenderness.  Abdominal: Soft. Bowel sounds are normal. He exhibits no distension and no mass. There is no tenderness. There is no rebound and no guarding.  Musculoskeletal: Normal range of motion. He exhibits tenderness. He exhibits no edema.  Lymphadenopathy:    He has no cervical adenopathy.  Neurological: He is alert and oriented to person, place, and time. He has normal reflexes. He displays normal reflexes. No cranial nerve deficit. He exhibits normal muscle tone. He displays a negative Romberg sign. Coordination abnormal. Gait normal.  Skin: Skin is warm and dry. No rash noted.  Psychiatric: His behavior is normal. Judgment and thought content normal.  RA deformities  Lab Results  Component Value Date   WBC 8.8 09/02/2014   HGB 14.4 09/02/2014   HCT 42.7 09/02/2014   PLT 242.0 09/02/2014   GLUCOSE 107* 09/02/2014   CHOL 139 12/31/2013   TRIG 44.0 12/31/2013   HDL 41.60 12/31/2013   LDLCALC 89 12/31/2013   ALT 10 09/02/2014   AST 13 09/02/2014   NA 132* 09/02/2014   K 4.1 09/02/2014   CL 96 09/02/2014   CREATININE 1.03 09/02/2014   BUN 26* 09/02/2014   CO2 30 09/02/2014   TSH 1.85 09/02/2014   PSA 1.95 10/15/2010   INR 1.1* 06/14/2014   HGBA1C 6.0 12/31/2013   I personally provided Proair respiclick inhaler use teaching. After the teaching patient was able to demonstrate it's use effectively. All questions were answered  No results found.  Assessment & Plan:   There are no diagnoses linked to this encounter. I am  having [name] maintain his aspirin, cholecalciferol, ondansetron, albuterol, nitroGLYCERIN, fluticasone, Polyethyl Glycol-Propyl Glycol, clopidogrel, metoprolol tartrate, HYDROmorphone, senna, pravastatin, ALPRAZolam, pantoprazole, gabapentin, FLUoxetine, losartan-hydrochlorothiazide, predniSONE, Oxycodone HCl, and methocarbamol.  No orders of the defined types were placed in this encounter.     Follow-up: No Follow-up on file.  [name], MD

## 2014-12-05 NOTE — Assessment & Plan Note (Signed)
Dr Johnsie Cancel Catherization July '06 - PCI/Stent 1st OM, 3/16 LAD STENT Medications: Lopressor, ASA, Plavix On  Pravachol

## 2014-12-05 NOTE — Assessment & Plan Note (Signed)
[facility]

## 2014-12-05 NOTE — Assessment & Plan Note (Signed)
Lopressor and Losartan

## 2014-12-05 NOTE — Patient Instructions (Signed)
Chair yoga - to try

## 2014-12-05 NOTE — Assessment & Plan Note (Signed)
Proair respiclick to use prn

## 2015-01-30 ENCOUNTER — Ambulatory Visit: Payer: Medicare Other | Admitting: Internal Medicine

## 2015-03-10 ENCOUNTER — Encounter: Payer: Self-pay | Admitting: Internal Medicine

## 2015-03-10 ENCOUNTER — Ambulatory Visit (INDEPENDENT_AMBULATORY_CARE_PROVIDER_SITE_OTHER): Payer: Medicare Other | Admitting: Internal Medicine

## 2015-03-10 ENCOUNTER — Other Ambulatory Visit (INDEPENDENT_AMBULATORY_CARE_PROVIDER_SITE_OTHER): Payer: Medicare Other

## 2015-03-10 VITALS — BP 130/80 | HR 55 | Wt 163.0 lb

## 2015-03-10 DIAGNOSIS — I2510 Atherosclerotic heart disease of native coronary artery without angina pectoris: Secondary | ICD-10-CM

## 2015-03-10 DIAGNOSIS — I480 Paroxysmal atrial fibrillation: Secondary | ICD-10-CM

## 2015-03-10 DIAGNOSIS — I10 Essential (primary) hypertension: Secondary | ICD-10-CM

## 2015-03-10 DIAGNOSIS — Z23 Encounter for immunization: Secondary | ICD-10-CM

## 2015-03-10 LAB — LIPID PANEL
Cholesterol: 128 mg/dL (ref 0–200)
HDL: 39.3 mg/dL (ref >39.00)
LDL Cholesterol: 70 mg/dL (ref 0–99)
NonHDL: 88.76
Total CHOL/HDL Ratio: 3
Triglycerides: 95 mg/dL (ref 0.0–149.0)
VLDL: 19 mg/dL (ref 0.0–40.0)

## 2015-03-10 LAB — CBC WITH DIFFERENTIAL/PLATELET
Basophils Absolute: 0 10*3/uL (ref 0.0–0.1)
Basophils Relative: 0.4 % (ref 0.0–3.0)
Eosinophils Absolute: 0 10*3/uL (ref 0.0–0.7)
Eosinophils Relative: 0.5 % (ref 0.0–5.0)
HCT: 42.8 % (ref 39.0–52.0)
Hemoglobin: 14.2 g/dL (ref 13.0–17.0)
Lymphocytes Relative: 15.4 % (ref 12.0–46.0)
Lymphs Abs: 1.4 10*3/uL (ref 0.7–4.0)
MCHC: 33.2 g/dL (ref 30.0–36.0)
MCV: 94.6 fl (ref 78.0–100.0)
Monocytes Absolute: 0.9 10*3/uL (ref 0.1–1.0)
Monocytes Relative: 9.4 % (ref 3.0–12.0)
Neutro Abs: 7 10*3/uL (ref 1.4–7.7)
Neutrophils Relative %: 74.3 % (ref 43.0–77.0)
Platelets: 250 10*3/uL (ref 150.0–400.0)
RBC: 4.53 Mil/uL (ref 4.22–5.81)
RDW: 13.1 % (ref 11.5–15.5)
WBC: 9.4 10*3/uL (ref 4.0–10.5)

## 2015-03-10 LAB — BASIC METABOLIC PANEL
BUN: 20 mg/dL (ref 6–23)
CO2: 29 mEq/L (ref 19–32)
Calcium: 9.2 mg/dL (ref 8.4–10.5)
Chloride: 96 mEq/L (ref 96–112)
Creatinine, Ser: 0.98 mg/dL (ref 0.40–1.50)
GFR: 80.87 mL/min (ref >60.00)
Glucose, Bld: 104 mg/dL — ABNORMAL HIGH (ref 70–99)
Potassium: 4 mEq/L (ref 3.5–5.1)
Sodium: 132 mEq/L — ABNORMAL LOW (ref 135–145)

## 2015-03-10 LAB — URINALYSIS
Bilirubin Urine: NEGATIVE
Hgb urine dipstick: NEGATIVE
Ketones, ur: NEGATIVE
Leukocytes, UA: NEGATIVE
Nitrite: NEGATIVE
Specific Gravity, Urine: 1.025 (ref 1.000–1.030)
Total Protein, Urine: NEGATIVE
Urine Glucose: NEGATIVE
Urobilinogen, UA: 0.2 (ref 0.0–1.0)
pH: 5.5 (ref 5.0–8.0)

## 2015-03-10 LAB — TSH: TSH: 1.14 u[IU]/mL (ref 0.35–4.50)

## 2015-03-10 LAB — HEPATIC FUNCTION PANEL
ALT: 15 U/L (ref 0–53)
AST: 16 U/L (ref 0–37)
Albumin: 4.2 g/dL (ref 3.5–5.2)
Alkaline Phosphatase: 70 U/L (ref 39–117)
Bilirubin, Direct: 0.2 mg/dL (ref 0.0–0.3)
Total Bilirubin: 0.7 mg/dL (ref 0.2–1.2)
Total Protein: 7 g/dL (ref 6.0–8.3)

## 2015-03-10 NOTE — Assessment & Plan Note (Signed)
In NSR

## 2015-03-10 NOTE — Progress Notes (Signed)
Pre visit review using our clinic review tool, if applicable. No additional management support is needed unless otherwise documented below in the visit note. 

## 2015-03-10 NOTE — Assessment & Plan Note (Signed)
Medications: Lopressor, ASA, Plavix Dr Johnsie Cancel - will sch for foot surgery clearance

## 2015-03-10 NOTE — Assessment & Plan Note (Signed)
Monitor BP at home Lopressor/Losartan

## 2015-03-10 NOTE — Progress Notes (Signed)
Subjective:  Patient ID: [name], male    DOB: 02/04/1946  Age: 69 y.o. MRN: [id number]  CC: No chief complaint on file.   HPI [name] presents for depression, RA, R foot pain (surgery was planned at [facility] by Dr [name])  Outpatient Prescriptions Prior to Visit  Medication Sig Dispense Refill  . Albuterol Sulfate (PROAIR RESPICLICK) 476 (90 BASE) MCG/ACT AEPB Inhale 1-2 puffs into the lungs 4 (four) times daily as needed. 1 each 11  . ALPRAZolam (XANAX) 0.5 MG tablet TAKE 1 TABLET BY MOUTH TWICE A DAY AS NEEDED FOR ANXIETY 60 tablet 3  . aspirin (ASPIRIN EC) 81 MG EC tablet Take 1 tablet (81 mg total) by mouth every morning. 30 tablet 12  . Cholecalciferol (VITAMIN D3) 2000 UNITS capsule Take 1 capsule (2,000 Units total) by mouth daily. 100 capsule 3  . clopidogrel (PLAVIX) 75 MG tablet Take 1 tablet (75 mg total) by mouth daily with breakfast. 30 tablet 11  . FLUoxetine (PROZAC) 40 MG capsule TAKE ONE CAPSULE BY MOUTH TWICE A DAY 60 capsule 5  . fluticasone (FLONASE) 50 MCG/ACT nasal spray PLACE 2 SPRAYS INTO THE NOSE DAILY. 16 g 4  . gabapentin (NEURONTIN) 300 MG capsule TAKE 2 CAPSULES BY MOUTH IN THE MORNING, 1 CAPSULE MIDDAY, AND 2 CAPSULES AT BEDTIME 150 capsule 5  . HYDROmorphone (DILAUDID) 2 MG tablet Take 1-2 tablets by mouth every 3-4 hours for breakthrough pain.  Wean as able.    . losartan-hydrochlorothiazide (HYZAAR) 50-12.5 MG per tablet TAKE 1 TABLET BY MOUTH DAILY 90 tablet 3  . methocarbamol (ROBAXIN) 500 MG tablet Take 1 tablet (500 mg total) by mouth 2 (two) times daily. 60 tablet 5  . metoprolol tartrate (LOPRESSOR) 25 MG tablet TAKE 1 TABLET BY MOUTH TWICE A DAY 60 tablet 11  . nitroGLYCERIN (NITROSTAT) 0.4 MG SL tablet Place 1 tablet (0.4 mg total) under the tongue every 5 (five) minutes as needed for chest pain. 20 tablet 3  . ondansetron (ZOFRAN) 4 MG tablet Take 1 tablet (4 mg total) by mouth every 8 (eight) hours as needed for nausea or  vomiting. 20 tablet 1  . Oxycodone HCl 10 MG TABS TAKE 1/2 TO 1 TABLET EVERY 4 TO 6 HOURS AS NEEDED  0  . pantoprazole (PROTONIX) 40 MG tablet TAKE 1 TABLET BY MOUTH TWICE A DAY 60 tablet 11  . Polyethyl Glycol-Propyl Glycol 0.4-0.3 % SOLN Apply 2 drops to eye daily.    . pravastatin (PRAVACHOL) 20 MG tablet Take 1 tablet (20 mg total) by mouth daily. 30 tablet 11  . predniSONE (DELTASONE) 5 MG tablet TAKE (2) TABLETS BY MOUTH ONCE A DAY  3  . senna (SENOKOT) 8.6 MG tablet Take by mouth.     No facility-administered medications prior to visit.    ROS Review of Systems  Constitutional: Negative for appetite change, fatigue and unexpected weight change.  HENT: Negative for congestion, nosebleeds, sneezing, sore throat and trouble swallowing.   Eyes: Negative for itching and visual disturbance.  Respiratory: Negative for cough.   Cardiovascular: Negative for chest pain, palpitations and leg swelling.  Gastrointestinal: Negative for nausea, diarrhea, blood in stool and abdominal distention.  Genitourinary: Negative for frequency and hematuria.  Musculoskeletal: Positive for back pain, arthralgias and gait problem. Negative for joint swelling and neck pain.  Skin: Negative for rash.  Neurological: Negative for dizziness, tremors, speech difficulty and weakness.  Psychiatric/Behavioral: Positive for sleep disturbance. Negative for suicidal ideas, dysphoric  mood and agitation. The patient is nervous/anxious.     Objective:  BP 160/90 mmHg  Pulse 55  Wt 163 lb (73.936 kg)  SpO2 98%  BP Readings from Last 3 Encounters:  03/10/15 160/90  12/05/14 150/90  11/28/14 140/78    Wt Readings from Last 3 Encounters:  03/10/15 163 lb (73.936 kg)  12/05/14 168 lb (76.204 kg)  11/28/14 166 lb (75.297 kg)    Physical Exam  Constitutional: He is oriented to person, place, and time. He appears well-developed. No distress.  NAD  HENT:  Mouth/Throat: Oropharynx is clear and moist.  Eyes:  Conjunctivae are normal. Pupils are equal, round, and reactive to light.  Neck: Normal range of motion. No JVD present. No thyromegaly present.  Cardiovascular: Normal rate, regular rhythm, normal heart sounds and intact distal pulses.  Exam reveals no gallop and no friction rub.   No murmur heard. Pulmonary/Chest: Effort normal and breath sounds normal. No respiratory distress. He has no wheezes. He has no rales. He exhibits no tenderness.  Abdominal: Soft. Bowel sounds are normal. He exhibits no distension and no mass. There is no tenderness. There is no rebound and no guarding.  Musculoskeletal: Normal range of motion. He exhibits tenderness. He exhibits no edema.  Lymphadenopathy:    He has no cervical adenopathy.  Neurological: He is alert and oriented to person, place, and time. He has normal reflexes. No cranial nerve deficit. He exhibits normal muscle tone. He displays a negative Romberg sign. Coordination abnormal. Gait normal.  Skin: Skin is warm and dry. No rash noted. No erythema.  Psychiatric: He has a normal mood and affect. His behavior is normal. Judgment and thought content normal.  R foot is painful  Lab Results  Component Value Date   WBC 8.8 09/02/2014   HGB 14.4 09/02/2014   HCT 42.7 09/02/2014   PLT 242.0 09/02/2014   GLUCOSE 117* 12/05/2014   CHOL 139 12/31/2013   TRIG 44.0 12/31/2013   HDL 41.60 12/31/2013   LDLCALC 89 12/31/2013   ALT 10 09/02/2014   AST 13 09/02/2014   NA 137 12/05/2014   K 4.3 12/05/2014   CL 101 12/05/2014   CREATININE 1.06 12/05/2014   BUN 20 12/05/2014   CO2 30 12/05/2014   TSH 0.76 12/05/2014   PSA 1.95 10/15/2010   INR 1.1* 06/14/2014   HGBA1C 6.0 12/31/2013    No results found.  Assessment & Plan:   There are no diagnoses linked to this encounter. I am having [name] maintain his aspirin, ondansetron, nitroGLYCERIN, fluticasone, Polyethyl Glycol-Propyl Glycol, clopidogrel, metoprolol tartrate, HYDROmorphone, senna,  pravastatin, ALPRAZolam, pantoprazole, gabapentin, FLUoxetine, losartan-hydrochlorothiazide, predniSONE, Oxycodone HCl, methocarbamol, Albuterol Sulfate, and Vitamin D3.  No orders of the defined types were placed in this encounter.     Follow-up: No Follow-up on file.  [name], MD

## 2015-03-23 ENCOUNTER — Other Ambulatory Visit: Payer: Self-pay | Admitting: Internal Medicine
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
## 2015-03-28 ENCOUNTER — Ambulatory Visit (INDEPENDENT_AMBULATORY_CARE_PROVIDER_SITE_OTHER): Payer: Medicare Other | Admitting: Physician Assistant

## 2015-03-28 ENCOUNTER — Other Ambulatory Visit: Payer: Self-pay | Admitting: Physician Assistant

## 2015-03-28 ENCOUNTER — Ambulatory Visit
Admission: RE | Admit: 2015-03-28 | Discharge: 2015-03-28 | Disposition: A | Discharge status: Home / Self Care | Payer: Medicare Other | Source: Ambulatory Visit | Attending: Physician Assistant | Admitting: Physician Assistant

## 2015-03-28 ENCOUNTER — Encounter: Payer: Self-pay | Admitting: Physician Assistant

## 2015-03-28 VITALS — BP 132/82 | HR 62 | Ht 67.0 in | Wt 167.0 lb

## 2015-03-28 DIAGNOSIS — R05 Cough: Secondary | ICD-10-CM

## 2015-03-28 DIAGNOSIS — R058 Other specified cough: Secondary | ICD-10-CM

## 2015-03-28 DIAGNOSIS — I2510 Atherosclerotic heart disease of native coronary artery without angina pectoris: Secondary | ICD-10-CM

## 2015-03-28 NOTE — Patient Instructions (Signed)
Medication Instructions:  1. START ASPIRIN 81 MG DAILY  Labwork: NONE  Testing/Procedures: CHEST XRAY TODAY AT [facility]   Follow-Up: 2-3 MONTHS WITH DR. Johnsie Cancel  Any Other Special Instructions Will Be Listed Below (If Applicable). 1. PER HAO MENG,PA HE WILL NEED TO HAVE YOUR FOOT SURGERY DELAYED UNTIL AFTER 06/17/15 WHICH WILL BE 1 YEAR YOU HAVE BEEN ON THE PLAIVIX; WE WILL SEND THE OFFICE NOTE FROM TODAY TO DR. Vita Barley AT [facility] ; ONCE YOU SCHEDULED FOR SURGERY YOU WILL NEED TO FOLLOW THESE INSTRUCTIONS: HOLD PLAVIX, AND ASPIRIN 5 DAYS BEFORE THE SURGERY.   2. YOU WILL NEED TO FOLLOW UP WITH PRIMARY CARE WITH IN THE NEXT WEEK TO FOLLOW UP ON PRODUCTIVE COUGH     If you need a refill on your cardiac medications before your next appointment, please call your pharmacy.

## 2015-03-30 ENCOUNTER — Telehealth: Payer: Self-pay | Admitting: *Deleted

## 2015-03-30 NOTE — Telephone Encounter (Signed)
-----   Message from [place], [place] sent at 03/28/2015  2:14 PM EST ----- No pneumonia seen on chest x ray, followup with PCP as previously planned for productive cough

## 2015-03-30 NOTE — Telephone Encounter (Signed)
Spoke with pt and he has been made aware that his chest x ray didn't show any signs of pneumonia and he should follow-up with his PCP for the cough he had.  She verbalized understanding.

## 2015-04-04 ENCOUNTER — Other Ambulatory Visit: Payer: Self-pay | Admitting: Internal Medicine

## 2015-04-21 ENCOUNTER — Ambulatory Visit: Payer: Medicare Other | Admitting: Cardiovascular Disease

## 2015-04-25 ENCOUNTER — Other Ambulatory Visit: Payer: Self-pay | Admitting: Internal Medicine

## 2015-04-25 DIAGNOSIS — N401 Enlarged prostate with lower urinary tract symptoms: Secondary | ICD-10-CM | POA: Diagnosis not present

## 2015-04-25 DIAGNOSIS — M1120 Other chondrocalcinosis, unspecified site: Secondary | ICD-10-CM | POA: Diagnosis not present

## 2015-04-25 DIAGNOSIS — M150 Primary generalized (osteo)arthritis: Secondary | ICD-10-CM | POA: Diagnosis not present

## 2015-04-25 DIAGNOSIS — M7989 Other specified soft tissue disorders: Secondary | ICD-10-CM | POA: Diagnosis not present

## 2015-04-25 DIAGNOSIS — M5136 Other intervertebral disc degeneration, lumbar region: Secondary | ICD-10-CM | POA: Diagnosis not present

## 2015-04-25 DIAGNOSIS — M0579 Rheumatoid arthritis with rheumatoid factor of multiple sites without organ or systems involvement: Secondary | ICD-10-CM | POA: Diagnosis not present

## 2015-04-25 DIAGNOSIS — M2550 Pain in unspecified joint: Secondary | ICD-10-CM | POA: Diagnosis not present

## 2015-04-26 NOTE — Telephone Encounter (Signed)
Please advise, thanks.

## 2015-04-28 NOTE — Telephone Encounter (Signed)
Xanax rx called in to [facility]

## 2015-05-01 DIAGNOSIS — N138 Other obstructive and reflux uropathy: Secondary | ICD-10-CM | POA: Diagnosis not present

## 2015-05-01 DIAGNOSIS — R3915 Urgency of urination: Secondary | ICD-10-CM | POA: Diagnosis not present

## 2015-05-01 DIAGNOSIS — R972 Elevated prostate specific antigen [PSA]: Secondary | ICD-10-CM | POA: Diagnosis not present

## 2015-05-01 DIAGNOSIS — N401 Enlarged prostate with lower urinary tract symptoms: Secondary | ICD-10-CM | POA: Diagnosis not present

## 2015-05-01 DIAGNOSIS — Z0000 Encounter for general adult medical examination without abnormal findings: Secondary | ICD-10-CM | POA: Diagnosis not present

## 2015-05-01 DIAGNOSIS — N5201 Erectile dysfunction due to arterial insufficiency: Secondary | ICD-10-CM | POA: Diagnosis not present

## 2015-05-24 ENCOUNTER — Other Ambulatory Visit: Payer: Self-pay

## 2015-05-24 MED ORDER — CLOPIDOGREL BISULFATE 75 MG PO TABS: 75.0000 mg | ORAL_TABLET | Freq: Every day | ORAL | Status: DC

## 2015-07-03 DIAGNOSIS — M05771 Rheumatoid arthritis with rheumatoid factor of right ankle and foot without organ or systems involvement: Secondary | ICD-10-CM | POA: Diagnosis not present

## 2015-07-04 NOTE — Progress Notes (Signed)
Patient ID: [name], male   DOB: [birth year], 70 y.o.   MRN: [id number]    Cardiology Office Note    Date:  07/07/2015   ID:  DWAN HEMMELGARN, DOB 29-Feb-1946, MRN [id number]  PCP:  [name], MD  Cardiologist:  Dr. Jenkins Rouge   Electrophysiologist:  n/a  Chief Complaint  Patient presents with  . Coronary Artery Disease    no sx    History of Present Illness:  [name] is a 70 y.o. male with a hx of CAD, paroxysmal atrial fibrillation not on systemic anticoagulation given need for DAPT, hyperlipidemia, COPD, GERD, obstructive sleep apnea, rheumatoid arthritis, and hemochromatosis. He had left circumflex stent with spiral dissection in 2006. He had TEE DCCV in May 2012. He had h/o post lumbar fusion surgical wound infection with positive blood culture of pseudomonas and required 6 weeks of IV antibiotics near the end of 2014. He was admitted in March 2016 with chest tightness and dyspnea and underwent cardiac catheterization which showed new stenosis in proximal to mid LAD treated with overlapping drug-eluting stents. He was started on aspirin and Plavix. Based on recent phone note, it seems the patient took himself off of Plavix for up to 4 days in May, he was advised to resume Plavix given recent complex DES. In June, patient contacted the cardiology office insisting on getting myelogram done due to severe pain. Dr. Johnsie Cancel cleared him to stop Plavix for 5 days prior to myelogram and resume as soon as possible. Based on most recent office note by his PCP, he is planning for R foot surgery (R midfoot arthodesis, revision of 2-4 MT head resection) for OA in Feb at [facility] by Dr. Pasty Spillers.   Last seen by PA 03/10/15  for preop clearance for R foot surgery.with DR Deorio at [facility]   He wanted to postpone until a year post stent He is now just taking plavix as ASA makes him bleed  Still not sure he will get foot surgery    Past Medical History  Diagnosis Date  . Hx of  colonic polyps   . Diverticulosis   . Hypertension   . Hemochromatosis     dx'd 14 yrs ago last ferritin Aug 11, 08 52 (22-322), Fe 136 ("I had 250 phlebotomies for that")  . CAD (coronary artery disease)     minimal coronary plaque in the LAD and right coronary system. 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 7-06. Last nuclear stress 11-17-06 fixed anterior/ inferior defect, no inducible ischemia, EF 81%  . Allergic rhinitis   . Hx of colonoscopy   . Depression   . GERD (gastroesophageal reflux disease)   . Chronic back pain     "all over back"  . PONV (postoperative nausea and vomiting)   . High cholesterol     hx  . Falls frequently     "since 02/2013" (06/16/2013)  . Anxiety   . Narcotic abuse     per family  . Alcoholism /alcohol abuse ([place])     per family  . Bacterial infection   . Myocardial infarction [facility]) 2006    "related to catheterization"  . Dysrhythmia 01-24-12    past hx. A.Fib x1 episode-responded to med.  [name] COPD (chronic obstructive pulmonary disease) ([place])   . Sleep apnea     "never dx'd;" (06/17/2014))  . Migraine     "for 3 months in 2015; related to infection  in my back" (06/17/2014)  . Osteoarthritis   . RA (rheumatoid arthritis) [facility])     Past Surgical History  Procedure Laterality Date  . Appendectomy  ~ 1956  . Total knee arthroplasty Bilateral 2002  . Total shoulder replacement Left 2006  . Foot surgery Right 11-08    for removal of bone spurs-  . Ankle reconstruction Right 6-09    [facility]  . Posterior lumbar fusion  12-10    L4-5 diskectomy w/ fusion, cage placement and rods; [place]  . Replacement unicondylar joint knee Left ~ 2003    "~ 6 months after total knee replaced"  . Total shoulder replacement Right ~ 2007    Dr. Marlou Sa  . Joint replacement    . Cataract extraction, bilateral Bilateral 01-24-12  . Orif shoulder fracture  02/06/2012    Procedure: OPEN REDUCTION INTERNAL  FIXATION (ORIF) SHOULDER FRACTURE;  Surgeon: Nita Sells, MD;  Location: [facility];  Service: Orthopedics;  Laterality: Left;  ORIF of a Left Shoulder Fracture with  Iliac Crest Bone Graft aspiration   . Harvest bone graft  02/06/2012    Procedure: HARVEST ILIAC BONE GRAFT;  Surgeon: Nita Sells, MD;  Location: [facility];  Service: Orthopedics;;  bone marrow aspirqation   . Hardware removal  03/09/2012    Procedure: HARDWARE REMOVAL;  Surgeon: Nita Sells, MD;  Location: [place];  Service: Orthopedics;  Laterality: Left;  Hardware Removal from Left Shoulder  . Carpal tunnel release Right 1990's  . Total ankle replacement Right 2008    at [facility]  . Hammer toe surgery Right 07/2012    "broke 4 hammertoes"   . Posterior lumbar fusion 4 level N/A 03/02/2013    Procedure: Lumbar One to Sacral One Posterior lumbar interbody fusion;  Surgeon: Floyce Stakes, MD;  Location: [place] [facility];  Service: Neurosurgery;  Laterality: N/A;  L1 to S1 Posterior lumbar interbody fusion  . Lumbar wound debridement N/A 03/17/2013    Procedure: Incision and drainage of superficial lumbar wound;  Surgeon: Floyce Stakes, MD;  Location: [place] [facility];  Service: Neurosurgery;  Laterality: N/A;  Incision and drainage of superficial lumbar wound  . Coronary angioplasty with stent placement  2006    "while repairing 1st stent, a second area tore and they had to place 2nd stent " ?LAD & CX  . Coronary angioplasty with stent placement  06/17/2014  . Inguinal hernia repair Bilateral   . Fracture surgery    . Bone tumor resection  ~ 1954    "taken off my mastoid"  . Meckel diverticulum excision  ~ 1956  . Abdominal adhesion surgery  ~ 1968  . Knee surgery Left ~ 2003    "6-12 months after uni knee removed synovial sack"  . Cyst excision  "several OR's"    "backX 2, back of my neck, face, inside right bicept, chest, wrist"  . Shoulder arthroscopy Left ~ 2004 X 2    "@ [facility];  left bone splinter in & had to clean it out"  . Left heart catheterization with coronary angiogram N/A 06/17/2014    PCI of diffuse severe stenosis in the proximal to mid LAD using overlapping drug-eluting stents.    Current Outpatient Prescriptions  Medication Sig Dispense Refill  . albuterol (PROVENTIL HFA;VENTOLIN HFA) 108 (90 Base) MCG/ACT inhaler Inhale 1-2 puffs into the lungs every 4 (four) hours as needed for wheezing or shortness of breath.    . ALPRAZolam (XANAX) 0.5 MG  tablet TAKE 1 TABLET BY MOUTH TWICE A DAY AS NEEDED FOR ANXIETY 60 tablet 2  . Cholecalciferol (VITAMIN D3) 2000 UNITS capsule Take 1 capsule (2,000 Units total) by mouth daily. 100 capsule 3  . clopidogrel (PLAVIX) 75 MG tablet Take 1 tablet (75 mg total) by mouth daily with breakfast. 90 tablet 0  . FLUoxetine (PROZAC) 40 MG capsule TAKE ONE CAPSULE BY MOUTH TWICE A DAY 60 capsule 5  . fluticasone (FLONASE) 50 MCG/ACT nasal spray Place 2 sprays into both nostrils daily. 16 g 4  . gabapentin (NEURONTIN) 300 MG capsule Take 300 mg by mouth 3 (three) times daily.    [name] losartan-hydrochlorothiazide (HYZAAR) 50-12.5 MG per tablet TAKE 1 TABLET BY MOUTH DAILY 90 tablet 3  . methocarbamol (ROBAXIN) 500 MG tablet Take 1 tablet (500 mg total) by mouth 2 (two) times daily. 60 tablet 5  . metoprolol tartrate (LOPRESSOR) 25 MG tablet TAKE 1 TABLET BY MOUTH TWICE A DAY 60 tablet 11  . nitroGLYCERIN (NITROSTAT) 0.4 MG SL tablet Place 1 tablet (0.4 mg total) under the tongue every 5 (five) minutes as needed for chest pain. 20 tablet 3  . ondansetron (ZOFRAN) 4 MG tablet Take 1 tablet (4 mg total) by mouth every 8 (eight) hours as needed for nausea or vomiting. 20 tablet 1  . Oxycodone HCl 10 MG TABS Take 5-10 mg by mouth daily as needed. For pain  0  . pantoprazole (PROTONIX) 40 MG tablet TAKE 1 TABLET BY MOUTH TWICE A DAY 60 tablet 11  . pravastatin (PRAVACHOL) 20 MG tablet Take 1 tablet (20 mg total) by mouth daily. 30 tablet 11    No current facility-administered medications for this visit.    Allergies:   Cefepime; Morphine and related; Penicillins; and Percocet   Social History   Social History  . Marital Status: Divorced    Spouse Name: N/A  . Number of Children: 2  . Years of Education: 16   Occupational History  . 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 Smoking status: Never Smoker   . Smokeless tobacco: Never Used  . Alcohol Use: No     Comment: "I do not drink anymore"  . Drug Use: No  . Sexual Activity:    Partners: Female   Other Topics Concern  . None   Social History Narrative   [employer] - BA. Married 1972-80yr seperated/divorced. Engaged April '11. 1 son '74; step-daughter '70. 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 During '11 discovered an [employer]- $500,000+ loss. Is handling this OK - will keep the business running.     Family History:  The patient's family history includes Cancer in his father and mother; Colon polyps in his father; Coronary artery disease in his father and maternal aunt; Heart attack in his brother and maternal aunt; Hemochromatosis in his sister; Hyperlipidemia in his brother; Hypertension in his father; Lung cancer in his mother; Macular degeneration in his mother; Other in his brother and mother; Prostate cancer in his father; Rheum arthritis in his sister; Thyroid disease in his sister; Uterine cancer in his mother. There is no history of Diabetes, Colon cancer, Esophageal cancer, Rectal cancer, or Stomach cancer.   ROS:   Please see the history of present illness.    ROS All other systems reviewed and are negative.   PHYSICAL EXAM:  VS:  BP 104/62 mmHg  Pulse 56  Ht 5' 6"  (1.676 m)  Wt 74.571 kg (164 lb 6.4 oz)  BMI 26.55 kg/m2  SpO2 97%   GEN: Well nourished, well developed, in no acute  distress HEENT: normal Neck: no JVD,  carotid bruits, no masses Cardiac: Normal S1/S2, RRR; no murmurs, rubs, or gallops, no edema  Respiratory:  clear to auscultation bilaterally; no wheezing, rhonchi or rales GI: soft, nontender, nondistended, + BS MS: no deformity or atrophy Skin: warm and dry, no rash Neuro:  Bilateral strength equal, no focal deficits  Psych: Alert and oriented x 3, normal affect  Wt Readings from Last 3 Encounters:  07/07/15 74.571 kg (164 lb 6.4 oz)  03/28/15 75.751 kg (167 lb)  03/10/15 73.936 kg (163 lb)      Studies/Labs Reviewed:   EKG:  03/28/15   It showed NSR rate 68  Normal   Recent Labs: 03/10/2015: ALT 15; BUN 20; Creatinine, Ser 0.98; Hemoglobin 14.2; Platelets 250.0; Potassium 4.0; Sodium 132*; TSH 1.14   Recent Lipid Panel    Component Value Date/Time   CHOL 128 03/10/2015 1008   TRIG 95.0 03/10/2015 1008   HDL 39.30 03/10/2015 1008   CHOLHDL 3 03/10/2015 1008   VLDL 19.0 03/10/2015 1008   [place] 70 03/10/2015 1008    Additional studies/ records that were reviewed today include:   LHC 06/17/14 Coronary angiography: Coronary dominance: right Left mainstem: Normal Left anterior descending (LAD): 80% proximal lesion before D1 50% just distal to D1 80% distal D1 30% multiple discrete Left circumflex (LCx): Primarily AV groove and OM1 Widely patent stents in OM Right coronary artery (RCA): Dominant 30% proximal 50% Mid vessel stenosis  Left ventriculography: Left ventricular systolic function is normal, LVEF is estimated at 55-65%, there is no significant mitral regurgitation  Final Conclusions: New LAD lesions patent stents in OM Recommendations: Reviewed with Dr Burt Knack PCI/Stent mulitple sights in LAD  Conclusions: Successful PCI of diffuse severe stenosis in the proximal to mid LAD using overlapping drug-eluting stent platforms  Recommendations: Dual antiplatelet therapy with aspirin and Plavix for at least 12  months.   Lesion Data: Vessel: LAD/proximal to mid Percent stenosis (pre): 80 TIMI-flow (pre): 3 Stent: 2.5 x 24 mm Promus DES and 3.0 x 20 mm Promus DES (overlapping) Percent stenosis (post): 0 TIMI-flow (post): 3  Myoview 05/20/14 Low risk stress nuclear study Poor quality scan with marked gut uptake and diaphragmatic motion on RAW images. Cannot r/o inferolateral wall ischemia.  LV Ejection Fraction: 63%. LV Wall Motion: NL LV Function; NL Wall Motion   ASSESSMENT:    CAD   PLAN:  In order of problems listed above:  1. CAD s/p overlapping DESx2 to LAD on 06/17/2014 Previous stenting circumflex 2006 with spiral dissection : no angina, Continue plavix Since not taking ASA will check P2Y make sure he is a responder  2. COPD: stable no active wheezing   3. paroxysmal atrial fibrillation not on systemic anticoagulation given need for DAPT  - s/p TEE DCCV in May 2012, previously on pradaxa, off pradaxa now given need for Plavix  4. Hyperlipidemia: LFT and lipid panel stable on 03/10/2015. Continue pravachol  5. obstructive sleep apnea  6. rheumatoid arthritis right foot surgery ok at this time if needed    [employer]

## 2015-07-07 ENCOUNTER — Ambulatory Visit (INDEPENDENT_AMBULATORY_CARE_PROVIDER_SITE_OTHER): Payer: Medicare Other | Admitting: Cardiovascular Disease

## 2015-07-07 ENCOUNTER — Encounter: Payer: Self-pay | Admitting: Cardiovascular Disease

## 2015-07-07 VITALS — BP 104/62 | HR 56 | Ht 66.0 in | Wt 164.4 lb

## 2015-07-07 DIAGNOSIS — Z79899 Other long term (current) drug therapy: Secondary | ICD-10-CM | POA: Diagnosis not present

## 2015-07-07 DIAGNOSIS — I2510 Atherosclerotic heart disease of native coronary artery without angina pectoris: Secondary | ICD-10-CM | POA: Diagnosis not present

## 2015-07-07 LAB — PLATELET INHIBITION P2Y12: Platelet Function  P2Y12: 10 [PRU] — ABNORMAL LOW (ref 194–418)

## 2015-07-07 NOTE — Patient Instructions (Addendum)
Medication Instructions:  Your physician recommends that you continue on your current medications as directed. Please refer to the Current Medication list given to you today.  Labwork: Your physician recommends that you return for lab work at the [facility] - P2Y12  Testing/Procedures: NONE  Follow-Up: Your physician wants you to follow-up in: 6 months with Dr. Johnsie Cancel. You will receive a reminder letter in the mail two months in advance. If you don't receive a letter, please call our office to schedule the follow-up appointment.  If you need a refill on your cardiac medications before your next appointment, please call your pharmacy.

## 2015-07-13 ENCOUNTER — Other Ambulatory Visit: Payer: Self-pay | Admitting: Internal Medicine

## 2015-07-24 DIAGNOSIS — M2550 Pain in unspecified joint: Secondary | ICD-10-CM | POA: Diagnosis not present

## 2015-07-24 DIAGNOSIS — M150 Primary generalized (osteo)arthritis: Secondary | ICD-10-CM | POA: Diagnosis not present

## 2015-07-24 DIAGNOSIS — M1120 Other chondrocalcinosis, unspecified site: Secondary | ICD-10-CM | POA: Diagnosis not present

## 2015-07-24 DIAGNOSIS — M0579 Rheumatoid arthritis with rheumatoid factor of multiple sites without organ or systems involvement: Secondary | ICD-10-CM | POA: Diagnosis not present

## 2015-07-24 DIAGNOSIS — M25571 Pain in right ankle and joints of right foot: Secondary | ICD-10-CM | POA: Diagnosis not present

## 2015-07-24 DIAGNOSIS — M5136 Other intervertebral disc degeneration, lumbar region: Secondary | ICD-10-CM | POA: Diagnosis not present

## 2015-07-26 DIAGNOSIS — M47812 Spondylosis without myelopathy or radiculopathy, cervical region: Secondary | ICD-10-CM | POA: Diagnosis not present

## 2015-07-26 DIAGNOSIS — M0579 Rheumatoid arthritis with rheumatoid factor of multiple sites without organ or systems involvement: Secondary | ICD-10-CM | POA: Diagnosis not present

## 2015-07-31 ENCOUNTER — Ambulatory Visit: Payer: Medicare Other | Admitting: Neurology

## 2015-07-31 DIAGNOSIS — M19071 Primary osteoarthritis, right ankle and foot: Secondary | ICD-10-CM | POA: Diagnosis not present

## 2015-07-31 DIAGNOSIS — Z471 Aftercare following joint replacement surgery: Secondary | ICD-10-CM | POA: Diagnosis not present

## 2015-07-31 DIAGNOSIS — Z96661 Presence of right artificial ankle joint: Secondary | ICD-10-CM | POA: Diagnosis not present

## 2015-08-07 DIAGNOSIS — N5201 Erectile dysfunction due to arterial insufficiency: Secondary | ICD-10-CM | POA: Diagnosis not present

## 2015-08-07 DIAGNOSIS — R3915 Urgency of urination: Secondary | ICD-10-CM | POA: Diagnosis not present

## 2015-08-07 DIAGNOSIS — N401 Enlarged prostate with lower urinary tract symptoms: Secondary | ICD-10-CM | POA: Diagnosis not present

## 2015-08-07 DIAGNOSIS — N138 Other obstructive and reflux uropathy: Secondary | ICD-10-CM | POA: Diagnosis not present

## 2015-08-07 DIAGNOSIS — Z0000 Encounter for general adult medical examination without abnormal findings: Secondary | ICD-10-CM | POA: Diagnosis not present

## 2015-08-08 ENCOUNTER — Other Ambulatory Visit: Payer: Self-pay | Admitting: Internal Medicine

## 2015-08-10 ENCOUNTER — Other Ambulatory Visit: Payer: Self-pay | Admitting: Internal Medicine

## 2015-08-15 ENCOUNTER — Encounter: Payer: Self-pay | Admitting: Neurology

## 2015-08-16 ENCOUNTER — Telehealth: Payer: Self-pay | Admitting: Internal Medicine

## 2015-08-16 ENCOUNTER — Other Ambulatory Visit (INDEPENDENT_AMBULATORY_CARE_PROVIDER_SITE_OTHER): Payer: Medicare Other

## 2015-08-16 DIAGNOSIS — R252 Cramp and spasm: Secondary | ICD-10-CM

## 2015-08-16 LAB — BASIC METABOLIC PANEL
BUN: 22 mg/dL (ref 6–23)
CO2: 28 mEq/L (ref 19–32)
Calcium: 9 mg/dL (ref 8.4–10.5)
Chloride: 95 mEq/L — ABNORMAL LOW (ref 96–112)
Creatinine, Ser: 1.15 mg/dL (ref 0.40–1.50)
GFR: 67.15 mL/min (ref >60.00)
Glucose, Bld: 108 mg/dL — ABNORMAL HIGH (ref 70–99)
Potassium: 4.4 mEq/L (ref 3.5–5.1)
Sodium: 131 mEq/L — ABNORMAL LOW (ref 135–145)

## 2015-08-16 NOTE — Telephone Encounter (Signed)
Patient states in the last week he has had intense cramping in hands and ankles.  Patient states it could be potassium.  Patient would like to know if he can go to the lab to check potassium.  I have scheduled patient with Mable Paris for tomorrow 5/11 at 3:30pm.

## 2015-08-16 NOTE — Telephone Encounter (Signed)
Notified pt MD ok lab has been order...[name]

## 2015-08-16 NOTE — Telephone Encounter (Signed)
OK BMET and magnesium Thx

## 2015-08-17 ENCOUNTER — Ambulatory Visit (INDEPENDENT_AMBULATORY_CARE_PROVIDER_SITE_OTHER): Payer: Medicare Other | Admitting: Family

## 2015-08-17 ENCOUNTER — Other Ambulatory Visit (INDEPENDENT_AMBULATORY_CARE_PROVIDER_SITE_OTHER): Payer: Medicare Other

## 2015-08-17 ENCOUNTER — Encounter: Payer: Self-pay | Admitting: Family

## 2015-08-17 VITALS — BP 124/74 | HR 59 | Temp 97.5°F | Ht 66.0 in | Wt 165.0 lb

## 2015-08-17 DIAGNOSIS — M791 Myalgia, unspecified site: Secondary | ICD-10-CM

## 2015-08-17 DIAGNOSIS — I2510 Atherosclerotic heart disease of native coronary artery without angina pectoris: Secondary | ICD-10-CM

## 2015-08-17 LAB — TSH: TSH: 1.04 u[IU]/mL (ref 0.35–4.50)

## 2015-08-17 NOTE — Patient Instructions (Signed)
Suspect its your statin medication. Last lipid panel looked good.   Please call your cardiologist and consult as whether he sees a reason to stay on statin with h/o stents.  If there is no improvement in your symptoms, or if there is any worsening of symptoms, or if you have any additional concerns, please return for re-evaluation; or, if we are closed, consider going to the Emergency Room for evaluation if symptoms urgent.

## 2015-08-17 NOTE — Progress Notes (Signed)
Pre visit review using our clinic review tool, if applicable. No additional management support is needed unless otherwise documented below in the visit note. 

## 2015-08-17 NOTE — Progress Notes (Signed)
Subjective:    Patient ID: [name], male    DOB: 1946/01/08, 70 y.o.   MRN: [id number]   [name] is a 70 y.o. male who presents today for an acute visit.    HPI Comments: Patient here for evaluation of cramping of hands and feet, becoming more frequent. It is becoming more bothersome and progressively worsen over the last year at intermitten times. Hasnt started any new medications.    On pravastatin.   Has RA, takes prednisone. Cannot take any of the biologics.     Past Medical History  Diagnosis Date  . Hx of colonic polyps   . Diverticulosis   . Hypertension   . Hemochromatosis     dx'd 14 yrs ago last ferritin Aug 11, 08 52 (22-322), Fe 136 ("I had 250 phlebotomies for that")  . CAD (coronary artery disease)     minimal coronary plaque in the LAD and right coronary system. 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 7-06. Last nuclear stress 11-17-06 fixed anterior/ inferior defect, no inducible ischemia, EF 81%  . Allergic rhinitis   . Hx of colonoscopy   . Depression   . GERD (gastroesophageal reflux disease)   . Chronic back pain     "all over back"  . PONV (postoperative nausea and vomiting)   . High cholesterol     hx  . Falls frequently     "since 02/2013" (06/16/2013)  . Anxiety   . Narcotic abuse     per family  . Alcoholism /alcohol abuse ([place])     per family  . Bacterial infection   . Myocardial infarction [facility]) 2006    "related to catheterization"  . Dysrhythmia 01-24-12    past hx. A.Fib x1 episode-responded to med.  [name] COPD (chronic obstructive pulmonary disease) ([place])   . Sleep apnea     "never dx'd;" (06/17/2014))  . Migraine     "for 3 months in 2015; related to infection in my back" (06/17/2014)  . Osteoarthritis   . RA (rheumatoid arthritis) ([facility])    Allergies: Cefepime; Morphine and related; Penicillins; and Percocet Current Outpatient Prescriptions on File  Prior to Visit  Medication Sig Dispense Refill  . albuterol (PROVENTIL HFA;VENTOLIN HFA) 108 (90 Base) MCG/ACT inhaler Inhale 1-2 puffs into the lungs every 4 (four) hours as needed for wheezing or shortness of breath.    . ALPRAZolam (XANAX) 0.5 MG tablet TAKE 1 TABLET BY MOUTH TWICE A DAY AS NEEDED FOR ANXIETY 60 tablet 2  . Cholecalciferol (VITAMIN D3) 2000 UNITS capsule Take 1 capsule (2,000 Units total) by mouth daily. 100 capsule 3  . clopidogrel (PLAVIX) 75 MG tablet TAKE 1 TABLET BY MOUTH EVERY DAY WITH BREAKFAST 90 tablet 1  . FLUoxetine (PROZAC) 40 MG capsule TAKE ONE CAPSULE BY MOUTH TWICE A DAY 60 capsule 5  . fluticasone (FLONASE) 50 MCG/ACT nasal spray Place 2 sprays into both nostrils daily. 16 g 4  . gabapentin (NEURONTIN) 300 MG capsule Take 300 mg by mouth 3 (three) times daily.    [name] losartan-hydrochlorothiazide (HYZAAR) 50-12.5 MG tablet TAKE 1 TABLET BY MOUTH DAILY 90 tablet 1  . methocarbamol (ROBAXIN) 500 MG tablet Take 1 tablet (500 mg total) by mouth 2 (two) times daily. 60 tablet 5  . metoprolol tartrate (LOPRESSOR) 25 MG tablet TAKE 1 TABLET BY MOUTH TWICE DAILY 180 tablet 1  . ondansetron (ZOFRAN) 4 MG tablet Take 1 tablet (4  mg total) by mouth every 8 (eight) hours as needed for nausea or vomiting. 20 tablet 1  . Oxycodone HCl 10 MG TABS Take 5-10 mg by mouth daily as needed. For pain  0  . pantoprazole (PROTONIX) 40 MG tablet TAKE 1 TABLET BY MOUTH TWICE A DAY 60 tablet 11  . pravastatin (PRAVACHOL) 20 MG tablet TAKE 1 TABLET EVERY DAY 30 tablet 11  . nitroGLYCERIN (NITROSTAT) 0.4 MG SL tablet Place 1 tablet (0.4 mg total) under the tongue every 5 (five) minutes as needed for chest pain. (Patient not taking: Reported on 08/17/2015) 20 tablet 3   No current facility-administered medications on file prior to visit.    Social History  Substance Use Topics  . Smoking status: Never Smoker   . Smokeless tobacco: Never Used  . Alcohol Use: No     Comment: "I do not  drink anymore"    Review of Systems  Constitutional: Negative for fever and chills.  Respiratory: Positive for shortness of breath (COPD). Negative for cough.        Denies orthopnea.   Cardiovascular: Negative for chest pain and palpitations.  Gastrointestinal: Negative for nausea and vomiting.  Musculoskeletal: Positive for myalgias.  Neurological: Negative for weakness.      Objective:    BP 124/74 mmHg  Pulse 59  Temp(Src) 97.5 F (36.4 C) (Oral)  Ht 5' 6"  (1.676 m)  Wt 165 lb (74.844 kg)  BMI 26.64 kg/m2  SpO2 96%   Physical Exam  Constitutional: He appears well-developed and well-nourished.  Cardiovascular: Regular rhythm and normal heart sounds.   Pulmonary/Chest: Effort normal and breath sounds normal. No respiratory distress. He has no wheezes. He has no rhonchi. He has no rales.  Lymphadenopathy:       Head (left side): No submandibular and no preauricular adenopathy present.  Neurological: He is alert.  Skin: Skin is warm and dry.  Psychiatric: He has a normal mood and affect. His speech is normal and behavior is normal.  Vitals reviewed.      Assessment & Plan:  1. Myalgia Myalgias or proximal, symmetric bilateral hands and feet. No cramping at this point in time. I discussed with patient that I suspect statin therapy is the etiology. No obvious drug interactions to be culprit or new medications started.No significant electrolyte abnormalities. Pending thyroid and creatinine kinase labs at this time. Potassium within normal limits. Sodium was slightly depressed from lab work yesterday.   If TSH and CK normal, patient will discuss with cardiologist if there is any other reason he sees why patient should be on statin therapy with his history of stenting. If cardiologist in agreement of plan, I will discontinue statin and patient can monitor lipids with PCP.  - TSH; Future - CK (Creatine Kinase)   I am having Mr. Vargo maintain his ondansetron,  nitroGLYCERIN, pantoprazole, methocarbamol, Vitamin D3, fluticasone, ALPRAZolam, gabapentin, Oxycodone HCl, albuterol, FLUoxetine, pravastatin, clopidogrel, metoprolol tartrate, losartan-hydrochlorothiazide, predniSONE, and CIALIS.   Meds ordered this encounter  Medications  . predniSONE (DELTASONE) 5 MG tablet    Sig: TAKE (2) TABLETS BY MOUTH ONCE A DAY    Refill:  3  . CIALIS 5 MG tablet    Sig: TK 1 T PO D    Refill:  11     Start medications as prescribed and explained to patient on After Visit Summary ( AVS). Risks, benefits, and alternatives of the medications and treatment plan prescribed today were discussed, and patient expressed understanding.   Education  regarding symptom management and diagnosis given to patient.   Follow-up:Plan follow-up as discussed or as needed if any worsening symptoms or change in condition.   Continue to follow with [name], MD for routine health maintenance.   [name] and I agreed with plan.   Mable Paris, FNP

## 2015-08-18 ENCOUNTER — Other Ambulatory Visit: Payer: Self-pay | Admitting: Cardiovascular Disease

## 2015-08-22 ENCOUNTER — Other Ambulatory Visit (INDEPENDENT_AMBULATORY_CARE_PROVIDER_SITE_OTHER): Payer: Medicare Other

## 2015-08-22 ENCOUNTER — Telehealth: Payer: Self-pay | Admitting: Family

## 2015-08-22 DIAGNOSIS — I2510 Atherosclerotic heart disease of native coronary artery without angina pectoris: Secondary | ICD-10-CM | POA: Diagnosis not present

## 2015-08-22 LAB — CK: Total CK: 48 U/L (ref 7–232)

## 2015-08-22 NOTE — Telephone Encounter (Signed)
Left vm regarding whether he has spoken to his cardiologist. I explained that I hesitate to stop his statin with his strong CVD history and the known benefits of statin therapy. I would like the input from his cardiologist. We can also trial another statin to see if helps. Appears CK lab was not drawn by lab, which was our error. Left number for him to call us back.

## 2015-08-22 NOTE — Telephone Encounter (Signed)
Pt called back. Gave information below. Pt is coming in today to have the CK lab redrawn. Pt would like to speak to cards after his CK lab results.

## 2015-08-22 NOTE — Addendum Note (Signed)
Addended by: Aviva Signs M on: 08/22/2015 10:14 AM   Modules accepted: Orders

## 2015-08-22 NOTE — Telephone Encounter (Signed)
Sound great. Thank you stefannie!  Disregard the  Last note I sent you about this- you answered my question

## 2015-08-24 ENCOUNTER — Telehealth: Payer: Self-pay | Admitting: Family

## 2015-08-24 NOTE — Telephone Encounter (Signed)
Patient called back in regards to labs.  Gave him Arnetts response.

## 2015-08-25 NOTE — Telephone Encounter (Signed)
Noted and documented in labs tab.

## 2015-08-28 ENCOUNTER — Encounter: Payer: Self-pay | Admitting: Internal Medicine

## 2015-08-28 ENCOUNTER — Ambulatory Visit (INDEPENDENT_AMBULATORY_CARE_PROVIDER_SITE_OTHER): Payer: Medicare Other | Admitting: Internal Medicine

## 2015-08-28 VITALS — BP 152/90 | HR 61 | Wt 160.0 lb

## 2015-08-28 DIAGNOSIS — M069 Rheumatoid arthritis, unspecified: Secondary | ICD-10-CM

## 2015-08-28 DIAGNOSIS — J449 Chronic obstructive pulmonary disease, unspecified: Secondary | ICD-10-CM | POA: Diagnosis not present

## 2015-08-28 DIAGNOSIS — R0600 Dyspnea, unspecified: Secondary | ICD-10-CM

## 2015-08-28 DIAGNOSIS — I10 Essential (primary) hypertension: Secondary | ICD-10-CM | POA: Diagnosis not present

## 2015-08-28 DIAGNOSIS — I208 Other forms of angina pectoris: Secondary | ICD-10-CM

## 2015-08-28 DIAGNOSIS — R0609 Other forms of dyspnea: Secondary | ICD-10-CM

## 2015-08-28 DIAGNOSIS — I2510 Atherosclerotic heart disease of native coronary artery without angina pectoris: Secondary | ICD-10-CM

## 2015-08-28 DIAGNOSIS — I480 Paroxysmal atrial fibrillation: Secondary | ICD-10-CM | POA: Diagnosis not present

## 2015-08-28 MED ORDER — CIALIS 5 MG PO TABS: 5.0000 mg | ORAL_TABLET | Freq: Every day | ORAL | Status: DC

## 2015-08-28 NOTE — Progress Notes (Signed)
Subjective:  Patient ID: [name], male    DOB: [birth year]  Age: 70 y.o. MRN: [id number]  CC: No chief complaint on file.   HPI [name] presents for RA, ED, BPH, chronic pain. R foot surgery is pending. No CP  Outpatient Prescriptions Prior to Visit  Medication Sig Dispense Refill  . albuterol (PROVENTIL HFA;VENTOLIN HFA) 108 (90 Base) MCG/ACT inhaler Inhale 1-2 puffs into the lungs every 4 (four) hours as needed for wheezing or shortness of breath.    . ALPRAZolam (XANAX) 0.5 MG tablet TAKE 1 TABLET BY MOUTH TWICE A DAY AS NEEDED FOR ANXIETY 60 tablet 2  . Cholecalciferol (VITAMIN D3) 2000 UNITS capsule Take 1 capsule (2,000 Units total) by mouth daily. 100 capsule 3  . CIALIS 5 MG tablet TK 1 T PO D  11  . clopidogrel (PLAVIX) 75 MG tablet TAKE 1 TABLET BY MOUTH EVERY DAY WITH BREAKFAST 90 tablet 1  . FLUoxetine (PROZAC) 40 MG capsule TAKE ONE CAPSULE BY MOUTH TWICE A DAY 60 capsule 5  . fluticasone (FLONASE) 50 MCG/ACT nasal spray Place 2 sprays into both nostrils daily. 16 g 4  . gabapentin (NEURONTIN) 300 MG capsule Take 300 mg by mouth 3 (three) times daily.    [name] losartan-hydrochlorothiazide (HYZAAR) 50-12.5 MG tablet TAKE 1 TABLET BY MOUTH DAILY 90 tablet 1  . methocarbamol (ROBAXIN) 500 MG tablet Take 1 tablet (500 mg total) by mouth 2 (two) times daily. 60 tablet 5  . metoprolol tartrate (LOPRESSOR) 25 MG tablet TAKE 1 TABLET BY MOUTH TWICE DAILY 180 tablet 1  . nitroGLYCERIN (NITROSTAT) 0.4 MG SL tablet Place 1 tablet (0.4 mg total) under the tongue every 5 (five) minutes as needed for chest pain. 20 tablet 3  . ondansetron (ZOFRAN) 4 MG tablet Take 1 tablet (4 mg total) by mouth every 8 (eight) hours as needed for nausea or vomiting. 20 tablet 1  . Oxycodone HCl 10 MG TABS Take 5-10 mg by mouth daily as needed. For pain  0  . pantoprazole (PROTONIX) 40 MG tablet TAKE 1 TABLET BY MOUTH TWICE A DAY 60 tablet 11  . pravastatin (PRAVACHOL) 20 MG tablet TAKE 1  TABLET EVERY DAY 30 tablet 11  . predniSONE (DELTASONE) 5 MG tablet TAKE (2) TABLETS BY MOUTH ONCE A DAY  3   No facility-administered medications prior to visit.    ROS Review of Systems  Constitutional: Positive for fatigue. Negative for appetite change and unexpected weight change.  HENT: Negative for congestion, nosebleeds, sneezing, sore throat and trouble swallowing.   Eyes: Negative for itching and visual disturbance.  Respiratory: Negative for cough and shortness of breath.   Cardiovascular: Negative for chest pain, palpitations and leg swelling.  Gastrointestinal: Negative for nausea, diarrhea, blood in stool and abdominal distention.  Genitourinary: Negative for frequency and hematuria.  Musculoskeletal: Positive for back pain, arthralgias and gait problem. Negative for joint swelling and neck pain.  Skin: Negative for rash.  Neurological: Negative for dizziness, tremors, speech difficulty and weakness.  Psychiatric/Behavioral: Negative for sleep disturbance, dysphoric mood and agitation. The patient is not nervous/anxious.     Objective:  BP 152/90 mmHg  Pulse 61  Wt 160 lb (72.576 kg)  SpO2 97%  BP Readings from Last 3 Encounters:  08/28/15 152/90  08/17/15 124/74  07/07/15 104/62    Wt Readings from Last 3 Encounters:  08/28/15 160 lb (72.576 kg)  08/17/15 165 lb (74.844 kg)  07/07/15 164 lb 6.4 oz (74.571  kg)    Physical Exam  Constitutional: He is oriented to person, place, and time. He appears well-developed. No distress.  NAD  HENT:  Mouth/Throat: Oropharynx is clear and moist.  Eyes: Conjunctivae are normal. Pupils are equal, round, and reactive to light.  Neck: Normal range of motion. No JVD present. No thyromegaly present.  Cardiovascular: Normal rate, regular rhythm, normal heart sounds and intact distal pulses.  Exam reveals no gallop and no friction rub.   No murmur heard. Pulmonary/Chest: Effort normal and breath sounds normal. No respiratory  distress. He has no wheezes. He has no rales. He exhibits no tenderness.  Abdominal: Soft. Bowel sounds are normal. He exhibits no distension and no mass. There is no tenderness. There is no rebound and no guarding.  Musculoskeletal: Normal range of motion. He exhibits no edema or tenderness.  Lymphadenopathy:    He has no cervical adenopathy.  Neurological: He is alert and oriented to person, place, and time. He has normal reflexes. No cranial nerve deficit. He exhibits normal muscle tone. He displays a negative Romberg sign. Coordination and gait normal.  Skin: Skin is warm and dry. No rash noted.  Psychiatric: He has a normal mood and affect. His behavior is normal. Judgment and thought content normal.    Lab Results  Component Value Date   WBC 9.4 03/10/2015   HGB 14.2 03/10/2015   HCT 42.8 03/10/2015   PLT 250.0 03/10/2015   GLUCOSE 108* 08/16/2015   CHOL 128 03/10/2015   TRIG 95.0 03/10/2015   HDL 39.30 03/10/2015   LDLCALC 70 03/10/2015   ALT 15 03/10/2015   AST 16 03/10/2015   NA 131* 08/16/2015   K 4.4 08/16/2015   CL 95* 08/16/2015   CREATININE 1.15 08/16/2015   BUN 22 08/16/2015   CO2 28 08/16/2015   TSH 1.04 08/17/2015   PSA 1.95 10/15/2010   INR 1.1* 06/14/2014   HGBA1C 6.0 12/31/2013    Dg Chest 2 View  03/28/2015  CLINICAL DATA:  Five days of productive cough and shortness of breath. History of COPD and previous MI EXAM: CHEST  2 VIEW COMPARISON:  PA and lateral chest x-ray of April 28, 2014 FINDINGS: There is chronic right hemidiaphragm elevation. The perihilar lung markings are coarse but stable. The cardiac silhouette is mildly enlarged but also stable. There is no pulmonary vascular congestion. There is no pleural effusion or pneumothorax. There is calcification in the wall of the thoracic aorta and the margins of the carotid and subclavian arteries. There is multilevel degenerative disc disease of the thoracic spine.There are prosthetic shoulder joints  bilaterally. The patient has undergone previous posterior fusion of the lumbar spine. IMPRESSION: There are chronic findings as described. No acute pneumonia nor CHF is observed. There is stable cardiomegaly and bibasilar scarring. Electronically Signed   By: David  Martinique M.D.   On: 03/28/2015 13:34    Assessment & Plan:   There are no diagnoses linked to this encounter. I am having Mr. Warrick maintain his ondansetron, nitroGLYCERIN, pantoprazole, methocarbamol, Vitamin D3, fluticasone, ALPRAZolam, gabapentin, Oxycodone HCl, albuterol, FLUoxetine, pravastatin, clopidogrel, metoprolol tartrate, losartan-hydrochlorothiazide, predniSONE, CIALIS, and ondansetron.  Meds ordered this encounter  Medications  . ondansetron (ZOFRAN) 4 MG tablet    Sig: Take by mouth as needed.     Follow-up: No Follow-up on file.  [name], MD

## 2015-08-28 NOTE — Assessment & Plan Note (Signed)
Pt is not using Breo

## 2015-08-28 NOTE — Assessment & Plan Note (Signed)
F/u w/Dr Johnsie Cancel

## 2015-08-28 NOTE — Assessment & Plan Note (Signed)
No relapse D/c NTG due to Cialis use

## 2015-08-28 NOTE — Assessment & Plan Note (Signed)
Lopressor and Losartan

## 2015-08-28 NOTE — Progress Notes (Signed)
Pre visit review using our clinic review tool, if applicable. No additional management support is needed unless otherwise documented below in the visit note. 

## 2015-08-28 NOTE — Assessment & Plan Note (Signed)
Predn qd

## 2015-08-28 NOTE — Assessment & Plan Note (Signed)
Chronic. 

## 2015-08-30 DIAGNOSIS — M150 Primary generalized (osteo)arthritis: Secondary | ICD-10-CM | POA: Diagnosis not present

## 2015-08-30 DIAGNOSIS — M0579 Rheumatoid arthritis with rheumatoid factor of multiple sites without organ or systems involvement: Secondary | ICD-10-CM | POA: Diagnosis not present

## 2015-08-30 DIAGNOSIS — R0600 Dyspnea, unspecified: Secondary | ICD-10-CM | POA: Diagnosis not present

## 2015-08-30 DIAGNOSIS — M5136 Other intervertebral disc degeneration, lumbar region: Secondary | ICD-10-CM | POA: Diagnosis not present

## 2015-08-30 DIAGNOSIS — M25531 Pain in right wrist: Secondary | ICD-10-CM | POA: Diagnosis not present

## 2015-08-30 DIAGNOSIS — M1120 Other chondrocalcinosis, unspecified site: Secondary | ICD-10-CM | POA: Diagnosis not present

## 2015-08-30 DIAGNOSIS — M25571 Pain in right ankle and joints of right foot: Secondary | ICD-10-CM | POA: Diagnosis not present

## 2015-08-31 ENCOUNTER — Ambulatory Visit: Payer: Medicare Other | Admitting: Neurology

## 2015-09-08 ENCOUNTER — Ambulatory Visit: Payer: Medicare Other | Admitting: Internal Medicine

## 2015-09-20 DIAGNOSIS — M19071 Primary osteoarthritis, right ankle and foot: Secondary | ICD-10-CM | POA: Diagnosis not present

## 2015-09-20 DIAGNOSIS — G8929 Other chronic pain: Secondary | ICD-10-CM | POA: Diagnosis not present

## 2015-09-20 DIAGNOSIS — I25110 Atherosclerotic heart disease of native coronary artery with unstable angina pectoris: Secondary | ICD-10-CM | POA: Diagnosis not present

## 2015-09-20 DIAGNOSIS — Z8739 Personal history of other diseases of the musculoskeletal system and connective tissue: Secondary | ICD-10-CM | POA: Diagnosis not present

## 2015-09-20 DIAGNOSIS — Z8639 Personal history of other endocrine, nutritional and metabolic disease: Secondary | ICD-10-CM | POA: Diagnosis not present

## 2015-09-20 DIAGNOSIS — J449 Chronic obstructive pulmonary disease, unspecified: Secondary | ICD-10-CM | POA: Diagnosis not present

## 2015-09-20 DIAGNOSIS — Z96661 Presence of right artificial ankle joint: Secondary | ICD-10-CM | POA: Diagnosis not present

## 2015-09-20 DIAGNOSIS — Z8679 Personal history of other diseases of the circulatory system: Secondary | ICD-10-CM | POA: Diagnosis not present

## 2015-09-20 DIAGNOSIS — M79671 Pain in right foot: Secondary | ICD-10-CM | POA: Diagnosis not present

## 2015-09-21 DIAGNOSIS — Z7902 Long term (current) use of antithrombotics/antiplatelets: Secondary | ICD-10-CM | POA: Diagnosis not present

## 2015-09-21 DIAGNOSIS — M19071 Primary osteoarthritis, right ankle and foot: Secondary | ICD-10-CM | POA: Diagnosis not present

## 2015-09-21 DIAGNOSIS — Z5111 Encounter for antineoplastic chemotherapy: Secondary | ICD-10-CM | POA: Diagnosis not present

## 2015-09-21 DIAGNOSIS — Z79899 Other long term (current) drug therapy: Secondary | ICD-10-CM | POA: Diagnosis not present

## 2015-09-25 DIAGNOSIS — R338 Other retention of urine: Secondary | ICD-10-CM | POA: Diagnosis not present

## 2015-09-26 ENCOUNTER — Emergency Department (HOSPITAL_COMMUNITY)
Admission: EM | Admit: 2015-09-26 | Discharge: 2015-09-26 | Disposition: A | Discharge status: Home / Self Care | Payer: Medicare Other | Attending: Emergency Medicine | Admitting: Emergency Medicine

## 2015-09-26 ENCOUNTER — Encounter (HOSPITAL_COMMUNITY): Payer: Self-pay | Admitting: Emergency Medicine

## 2015-09-26 ENCOUNTER — Emergency Department (HOSPITAL_BASED_OUTPATIENT_CLINIC_OR_DEPARTMENT_OTHER)
Admit: 2015-09-26 | Discharge: 2015-09-26 | Disposition: A | Discharge status: Home / Self Care | Payer: Medicare Other | Attending: Emergency Medicine | Admitting: Emergency Medicine

## 2015-09-26 ENCOUNTER — Other Ambulatory Visit: Payer: Self-pay

## 2015-09-26 DIAGNOSIS — M069 Rheumatoid arthritis, unspecified: Secondary | ICD-10-CM | POA: Insufficient documentation

## 2015-09-26 DIAGNOSIS — I959 Hypotension, unspecified: Secondary | ICD-10-CM | POA: Diagnosis not present

## 2015-09-26 DIAGNOSIS — F329 Major depressive disorder, single episode, unspecified: Secondary | ICD-10-CM | POA: Insufficient documentation

## 2015-09-26 DIAGNOSIS — Z79899 Other long term (current) drug therapy: Secondary | ICD-10-CM | POA: Insufficient documentation

## 2015-09-26 DIAGNOSIS — M79604 Pain in right leg: Secondary | ICD-10-CM | POA: Diagnosis present

## 2015-09-26 DIAGNOSIS — R3129 Other microscopic hematuria: Secondary | ICD-10-CM | POA: Diagnosis not present

## 2015-09-26 DIAGNOSIS — R739 Hyperglycemia, unspecified: Secondary | ICD-10-CM | POA: Diagnosis not present

## 2015-09-26 DIAGNOSIS — Z951 Presence of aortocoronary bypass graft: Secondary | ICD-10-CM | POA: Diagnosis not present

## 2015-09-26 DIAGNOSIS — D649 Anemia, unspecified: Secondary | ICD-10-CM | POA: Diagnosis not present

## 2015-09-26 DIAGNOSIS — I252 Old myocardial infarction: Secondary | ICD-10-CM | POA: Insufficient documentation

## 2015-09-26 DIAGNOSIS — E871 Hypo-osmolality and hyponatremia: Secondary | ICD-10-CM | POA: Insufficient documentation

## 2015-09-26 DIAGNOSIS — M79609 Pain in unspecified limb: Secondary | ICD-10-CM

## 2015-09-26 DIAGNOSIS — I10 Essential (primary) hypertension: Secondary | ICD-10-CM | POA: Diagnosis not present

## 2015-09-26 DIAGNOSIS — Z96611 Presence of right artificial shoulder joint: Secondary | ICD-10-CM | POA: Insufficient documentation

## 2015-09-26 DIAGNOSIS — R319 Hematuria, unspecified: Secondary | ICD-10-CM | POA: Diagnosis not present

## 2015-09-26 DIAGNOSIS — G8918 Other acute postprocedural pain: Secondary | ICD-10-CM | POA: Diagnosis not present

## 2015-09-26 DIAGNOSIS — J449 Chronic obstructive pulmonary disease, unspecified: Secondary | ICD-10-CM | POA: Diagnosis not present

## 2015-09-26 LAB — BASIC METABOLIC PANEL
Anion gap: 6 (ref 5–15)
BUN: 20 mg/dL (ref 6–20)
CO2: 27 mmol/L (ref 22–32)
Calcium: 7.9 mg/dL — ABNORMAL LOW (ref 8.9–10.3)
Chloride: 92 mmol/L — ABNORMAL LOW (ref 101–111)
Creatinine, Ser: 1 mg/dL (ref 0.61–1.24)
GFR calc Af Amer: >60 mL/min (ref >60)
GFR calc non Af Amer: >60 mL/min (ref >60)
Glucose, Bld: 173 mg/dL — ABNORMAL HIGH (ref 65–99)
Potassium: 4.6 mmol/L (ref 3.5–5.1)
Sodium: 125 mmol/L — ABNORMAL LOW (ref 135–145)

## 2015-09-26 LAB — CBC WITH DIFFERENTIAL/PLATELET
Basophils Absolute: 0 10*3/uL (ref 0.0–0.1)
Basophils Relative: 0 %
Eosinophils Absolute: 0 10*3/uL (ref 0.0–0.7)
Eosinophils Relative: 0 %
HCT: 27.1 % — ABNORMAL LOW (ref 39.0–52.0)
Hemoglobin: 9.5 g/dL — ABNORMAL LOW (ref 13.0–17.0)
Lymphocytes Relative: 8 %
Lymphs Abs: 0.6 10*3/uL — ABNORMAL LOW (ref 0.7–4.0)
MCH: 32.2 pg (ref 26.0–34.0)
MCHC: 35.1 g/dL (ref 30.0–36.0)
MCV: 91.9 fL (ref 78.0–100.0)
Monocytes Absolute: 0.4 10*3/uL (ref 0.1–1.0)
Monocytes Relative: 6 %
Neutro Abs: 5.6 10*3/uL (ref 1.7–7.7)
Neutrophils Relative %: 86 %
Platelets: 254 10*3/uL (ref 150–400)
RBC: 2.95 MIL/uL — ABNORMAL LOW (ref 4.22–5.81)
RDW: 12.8 % (ref 11.5–15.5)
WBC: 6.6 10*3/uL (ref 4.0–10.5)

## 2015-09-26 LAB — I-STAT CG4 LACTIC ACID, ED
Lactic Acid, Venous: 1.42 mmol/L (ref 0.5–2.0)
Lactic Acid, Venous: 0.95 mmol/L (ref 0.5–2.0)

## 2015-09-26 LAB — URINALYSIS, ROUTINE W REFLEX MICROSCOPIC
Bilirubin Urine: NEGATIVE
Glucose, UA: NEGATIVE mg/dL
Ketones, ur: NEGATIVE mg/dL
Nitrite: NEGATIVE
Protein, ur: 30 mg/dL — AB
Specific Gravity, Urine: 1.013 (ref 1.005–1.030)
pH: 6.5 (ref 5.0–8.0)

## 2015-09-26 LAB — URINE MICROSCOPIC-ADD ON

## 2015-09-26 LAB — I-STAT TROPONIN, ED: Troponin i, poc: 0 ng/mL (ref 0.00–0.08)

## 2015-09-26 LAB — POC OCCULT BLOOD, ED: Fecal Occult Bld: NEGATIVE

## 2015-09-26 MED ORDER — SODIUM CHLORIDE 0.9 % IV BOLUS (SEPSIS)
2000.0000 mL | Freq: Once | INTRAVENOUS | Status: AC
  Administered 2015-09-26: 2000 mL via INTRAVENOUS

## 2015-09-26 MED ORDER — BACITRACIN ZINC 500 UNIT/GM EX OINT
TOPICAL_OINTMENT | CUTANEOUS | Status: DC | PRN
  Filled 2015-09-26: qty 2.7

## 2015-09-26 NOTE — ED Notes (Signed)
Pt reports right foot surgery on 6/15, presents with foot cast. Pt was bedridden since surgery and unable to ambulate. Co right leg pain. Was told by PCP to come to ED for DVT evaluation. Also presents with foley catheter placed on 6/16 per pt. Denies shortness of breath nor chest pain.

## 2015-09-26 NOTE — Progress Notes (Signed)
VASCULAR LAB PRELIMINARY  PRELIMINARY  PRELIMINARY  PRELIMINARY  Right lower extremity venous duplex completed.    Preliminary report:  Right:  No evidence of DVT, superficial thrombosis, or Baker's cyst.  Trevone Prestwood, RVS 09/26/2015, 2:58 PM

## 2015-09-26 NOTE — ED Provider Notes (Signed)
CSN: [id number]     Arrival date & time 09/26/15  1238 History   First MD Initiated Contact with Patient 09/26/15 1316     Chief Complaint  Patient presents with  . Leg Pain     (Consider location/radiation/quality/duration/timing/severity/associated sxs/prior Treatment) HPI Present with right calf pain and right foot pain, cramping in nature onset yesterday. Pain is intermittent. Patient states "I feel good , I feel relaxed. No treatment prior to coming here. Patient is status post surgery on right foot at [facility] 3 days ago. Also left hospital with Foley catheter. He wishes Foley to come out. He denies fever denies other associated symptoms. He was told by his surgeon to come to the emergency department for evaluation.  He treated himself with morphine earlier today. Denies chest pain denies headache denies abdominal pain. He did have burning in his penis last night which has since resolved. Past Medical History  Diagnosis Date  . Hx of colonic polyps   . Diverticulosis   . Hypertension   . Hemochromatosis     dx'd 14 yrs ago last ferritin Aug 11, 08 52 (22-322), Fe 136 ("I had 250 phlebotomies for that")  . CAD (coronary artery disease)     minimal coronary plaque in the LAD and right coronary system. 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 7-06. Last nuclear stress 11-17-06 fixed anterior/ inferior defect, no inducible ischemia, EF 81%  . Allergic rhinitis   . Hx of colonoscopy   . Depression   . GERD (gastroesophageal reflux disease)   . Chronic back pain     "all over back"  . PONV (postoperative nausea and vomiting)   . High cholesterol     hx  . Falls frequently     "since 02/2013" (06/16/2013)  . Anxiety   . Narcotic abuse     per family  . Alcoholism /alcohol abuse ([place])     per family  . Bacterial infection   . Myocardial infarction [facility]) 2006    "related to catheterization"  .  Dysrhythmia 01-24-12    past hx. A.Fib x1 episode-responded to med.  [name] COPD (chronic obstructive pulmonary disease) ([place])   . Sleep apnea     "never dx'd;" (06/17/2014))  . Migraine     "for 3 months in 2015; related to infection in my back" (06/17/2014)  . Osteoarthritis   . RA (rheumatoid arthritis) [facility])    Past Surgical History  Procedure Laterality Date  . Appendectomy  ~ 1956  . Total knee arthroplasty Bilateral 2002  . Total shoulder replacement Left 2006  . Foot surgery Right 11-08    for removal of bone spurs-  . Ankle reconstruction Right 6-09    [facility]  . Posterior lumbar fusion  12-10    L4-5 diskectomy w/ fusion, cage placement and rods; [place]  . Replacement unicondylar joint knee Left ~ 2003    "~ 6 months after total knee replaced"  . Total shoulder replacement Right ~ 2007    Dr. Marlou Sa  . Joint replacement    . Cataract extraction, bilateral Bilateral 01-24-12  . Orif shoulder fracture  02/06/2012    Procedure: OPEN REDUCTION INTERNAL FIXATION (ORIF) SHOULDER FRACTURE;  Surgeon: Nita Sells, MD;  Location: [facility] ORS;  Service: Orthopedics;  Laterality: Left;  ORIF of a Left Shoulder Fracture with  Iliac Crest Bone Graft aspiration   . Harvest bone graft  02/06/2012    Procedure:  HARVEST ILIAC BONE GRAFT;  Surgeon: Nita Sells, MD;  Location: [facility];  Service: Orthopedics;;  bone marrow aspirqation   . Hardware removal  03/09/2012    Procedure: HARDWARE REMOVAL;  Surgeon: Nita Sells, MD;  Location: [place];  Service: Orthopedics;  Laterality: Left;  Hardware Removal from Left Shoulder  . Carpal tunnel release Right 1990's  . Total ankle replacement Right 2008    at [facility]  . Hammer toe surgery Right 07/2012    "broke 4 hammertoes"   . Posterior lumbar fusion 4 level N/A 03/02/2013    Procedure: Lumbar One to Sacral One Posterior lumbar interbody fusion;  Surgeon: Floyce Stakes, MD;  Location: [place] NEURO ORS;   Service: Neurosurgery;  Laterality: N/A;  L1 to S1 Posterior lumbar interbody fusion  . Lumbar wound debridement N/A 03/17/2013    Procedure: Incision and drainage of superficial lumbar wound;  Surgeon: Floyce Stakes, MD;  Location: [place] NEURO ORS;  Service: Neurosurgery;  Laterality: N/A;  Incision and drainage of superficial lumbar wound  . Coronary angioplasty with stent placement  2006    "while repairing 1st stent, a second area tore and they had to place 2nd stent " ?LAD & CX  . Coronary angioplasty with stent placement  06/17/2014  . Inguinal hernia repair Bilateral   . Fracture surgery    . Bone tumor resection  ~ 1954    "taken off my mastoid"  . Meckel diverticulum excision  ~ 1956  . Abdominal adhesion surgery  ~ 1968  . Knee surgery Left ~ 2003    "6-12 months after uni knee removed synovial sack"  . Cyst excision  "several OR's"    "backX 2, back of my neck, face, inside right bicept, chest, wrist"  . Shoulder arthroscopy Left ~ 2004 X 2    "@ [facility]; left bone splinter in & had to clean it out"  . Left heart catheterization with coronary angiogram N/A 06/17/2014    PCI of diffuse severe stenosis in the proximal to mid LAD using overlapping drug-eluting stents.   Family History  Problem Relation Age of Onset  . Uterine cancer Mother     survivor  . Macular degeneration Mother   . Other Mother     ankle edema  . Lung cancer Mother   . Cancer Mother     ovarian, lung  . Coronary artery disease Father   . Hypertension Father   . Prostate cancer Father   . Colon polyps Father   . Cancer Father     prostate  . Thyroid disease Sister   . Heart attack Brother   . Hyperlipidemia Brother   . Other Brother     Schizophrenic  . Diabetes Neg Hx   . Colon cancer Neg Hx   . Esophageal cancer Neg Hx   . Rectal cancer Neg Hx   . Stomach cancer Neg Hx   . Coronary artery disease Maternal Aunt   . Heart attack Maternal Aunt   . Rheum arthritis Sister   . Hemochromatosis  Sister    Social History  Substance Use Topics  . Smoking status: Never Smoker   . Smokeless tobacco: Never Used  . Alcohol Use: No     Comment: "I do not drink anymore"    Review of Systems  Constitutional: Negative.   HENT: Negative.   Respiratory: Negative.   Cardiovascular: Negative.   Gastrointestinal: Negative.   Genitourinary:  Burning in penis last night  Musculoskeletal: Positive for myalgias.       Cramping in right foot and right calf  Skin: Negative.   Neurological: Negative.   Psychiatric/Behavioral: Negative.       Allergies  Cefepime; Morphine and related; Penicillins; and Percocet  Home Medications   Prior to Admission medications   Medication Sig Start Date End Date Taking? Authorizing Provider  albuterol (PROVENTIL HFA;VENTOLIN HFA) 108 (90 Base) MCG/ACT inhaler Inhale 1-2 puffs into the lungs every 4 (four) hours as needed for wheezing or shortness of breath.    Historical Provider, MD  ALPRAZolam Duanne Moron) 0.5 MG tablet TAKE 1 TABLET BY MOUTH TWICE A DAY AS NEEDED FOR ANXIETY 04/28/15   Cassandria Anger, MD  Cholecalciferol (VITAMIN D3) 2000 UNITS capsule Take 1 capsule (2,000 Units total) by mouth daily. 12/05/14   Evie Lacks Plotnikov, MD  CIALIS 5 MG tablet Take 1 tablet (5 mg total) by mouth daily. 08/28/15   Evie Lacks Plotnikov, MD  clopidogrel (PLAVIX) 75 MG tablet TAKE 1 TABLET BY MOUTH EVERY DAY WITH BREAKFAST 08/10/15   Cassandria Anger, MD  FLUoxetine (PROZAC) 40 MG capsule TAKE ONE CAPSULE BY MOUTH TWICE A DAY 07/14/15   Evie Lacks Plotnikov, MD  fluticasone (FLONASE) 50 MCG/ACT nasal spray Place 2 sprays into both nostrils daily. 04/04/15   Evie Lacks Plotnikov, MD  gabapentin (NEURONTIN) 300 MG capsule Take 300 mg by mouth 3 (three) times daily. 04/21/09   Historical Provider, MD  losartan-hydrochlorothiazide (HYZAAR) 50-12.5 MG tablet TAKE 1 TABLET BY MOUTH DAILY 08/10/15   Cassandria Anger, MD  methocarbamol (ROBAXIN) 500 MG tablet Take 1  tablet (500 mg total) by mouth 2 (two) times daily. 11/28/14   Star Age, MD  metoprolol tartrate (LOPRESSOR) 25 MG tablet TAKE 1 TABLET BY MOUTH TWICE DAILY 08/10/15   Cassandria Anger, MD  ondansetron (ZOFRAN) 4 MG tablet Take 1 tablet (4 mg total) by mouth every 8 (eight) hours as needed for nausea or vomiting. 11/25/13   Cassandria Anger, MD  ondansetron (ZOFRAN) 4 MG tablet Take by mouth as needed. 08/24/14   Historical Provider, MD  Oxycodone HCl 10 MG TABS Take 5-10 mg by mouth daily as needed. For pain 05/31/15   Historical Provider, MD  pantoprazole (PROTONIX) 40 MG tablet TAKE 1 TABLET BY MOUTH TWICE A DAY 09/26/14   Cassandria Anger, MD  pravastatin (PRAVACHOL) 20 MG tablet TAKE 1 TABLET EVERY DAY 08/09/15   Evie Lacks Plotnikov, MD  predniSONE (DELTASONE) 5 MG tablet TAKE (2) TABLETS BY MOUTH ONCE A DAY 08/02/15   Historical Provider, MD   BP 97/56 mmHg  Pulse 73  Temp(Src) 98.5 F (36.9 C) (Oral)  Resp 16  SpO2 100% Physical Exam  Constitutional: He appears well-developed and well-nourished.  HENT:  Head: Normocephalic and atraumatic.  Eyes: Conjunctivae are normal. Pupils are equal, round, and reactive to light.  Neck: Neck supple. No tracheal deviation present. No thyromegaly present.  Cardiovascular: Normal rate and regular rhythm.   No murmur heard. Pulmonary/Chest: Effort normal and breath sounds normal.  Abdominal: Soft. Bowel sounds are normal. He exhibits no distension. There is no tenderness.  Genitourinary: Rectum normal. Guaiac negative stool.  Circumcised Foley in place. Its rectal normal tone stool Hemoccult negative, brown  Musculoskeletal: Normal range of motion. He exhibits no edema or tenderness.  Right lower extremity in a short leg cast. All extremities without redness swelling or tenderness neurovascular intact.  Neurological: He is  alert. Coordination normal.  Skin: Skin is warm and dry. No rash noted.  Psychiatric: He has a normal mood and affect.   Nursing note and vitals reviewed.   ED Course  Procedures (including critical care time) Labs Review Labs Reviewed - No data to display  Imaging Review No results found. I have personally reviewed and evaluated these images and lab results as part of my medical decision-making.   EKG Interpretation   Date/Time:  Tuesday September 26 2015 15:04:54 EDT Ventricular Rate:  65 PR Interval:    QRS Duration: 90 QT Interval:  430 QTC Calculation: 448 R Axis:   84 Text Interpretation:  Sinus rhythm Anteroseptal infarct, age indeterminate  No significant change since last tracing Confirmed by Winfred Leeds  MD, Cathlyn Tersigni  [phone]) on 09/26/2015 3:37:08 PM     Foley catheter was removed at patient's request. Patient's cast was removed by orthopedic technician. Extremity was examined to reveal well healing surgical wounds without surrounding redness or swelling or tenderness toes with good capillary refill DP pulse 2+. Noninvasive study of right leg was performed to check for DVT, which was negative. Posterior short leg splint with stirrup was placed on patient after venous study of his leg by orthopedic technician which is comfortable for patient. Results for orders placed or performed during the hospital encounter of 78/24/23  Basic metabolic panel  Result Value Ref Range   Sodium 125 (L) 135 - 145 mmol/L   Potassium 4.6 3.5 - 5.1 mmol/L   Chloride 92 (L) 101 - 111 mmol/L   CO2 27 22 - 32 mmol/L   Glucose, Bld 173 (H) 65 - 99 mg/dL   BUN 20 6 - 20 mg/dL   Creatinine, Ser 1.00 0.61 - 1.24 mg/dL   Calcium 7.9 (L) 8.9 - 10.3 mg/dL   GFR calc non Af Amer >60 >60 mL/min   GFR calc Af Amer >60 >60 mL/min   Anion gap 6 5 - 15  CBC with Differential/Platelet  Result Value Ref Range   WBC 6.6 4.0 - 10.5 K/uL   RBC 2.95 (L) 4.22 - 5.81 MIL/uL   Hemoglobin 9.5 (L) 13.0 - 17.0 g/dL   HCT 27.1 (L) 39.0 - 52.0 %   MCV 91.9 78.0 - 100.0 fL   MCH 32.2 26.0 - 34.0 pg   MCHC 35.1 30.0 - 36.0 g/dL   RDW 12.8  11.5 - 15.5 %   Platelets 254 150 - 400 K/uL   Neutrophils Relative % 86 %   Neutro Abs 5.6 1.7 - 7.7 K/uL   Lymphocytes Relative 8 %   Lymphs Abs 0.6 (L) 0.7 - 4.0 K/uL   Monocytes Relative 6 %   Monocytes Absolute 0.4 0.1 - 1.0 K/uL   Eosinophils Relative 0 %   Eosinophils Absolute 0.0 0.0 - 0.7 K/uL   Basophils Relative 0 %   Basophils Absolute 0.0 0.0 - 0.1 K/uL  Urinalysis, Routine w reflex microscopic (not at [facility])  Result Value Ref Range   Color, Urine AMBER (A) YELLOW   APPearance CLEAR CLEAR   Specific Gravity, Urine 1.013 1.005 - 1.030   pH 6.5 5.0 - 8.0   Glucose, UA NEGATIVE NEGATIVE mg/dL   Hgb urine dipstick LARGE (A) NEGATIVE   Bilirubin Urine NEGATIVE NEGATIVE   Ketones, ur NEGATIVE NEGATIVE mg/dL   Protein, ur 30 (A) NEGATIVE mg/dL   Nitrite NEGATIVE NEGATIVE   Leukocytes, UA SMALL (A) NEGATIVE  Urine microscopic-add on  Result Value Ref Range   Squamous Epithelial /  LPF 0-5 (A) NONE SEEN   WBC, UA 0-5 0 - 5 WBC/hpf   RBC / HPF TOO NUMEROUS TO COUNT 0 - 5 RBC/hpf   Bacteria, UA RARE (A) NONE SEEN  I-Stat CG4 Lactic Acid, ED  Result Value Ref Range   Lactic Acid, Venous 1.42 0.5 - 2.0 mmol/L  I-Stat CG4 Lactic Acid, ED  Result Value Ref Range   Lactic Acid, Venous 0.95 0.5 - 2.0 mmol/L  I-stat troponin, ED  Result Value Ref Range   Troponin i, poc 0.00 0.00 - 0.08 ng/mL   Comment 3          POC occult blood, ED Provider will collect  Result Value Ref Range   Fecal Occult Bld NEGATIVE NEGATIVE   No results found.  MDM  There is no signs of vascular compromise or infections patient's leg. I discussed case with Dr. Alain Marion, as anemia is concerning. As well as hyperglycemia.He wil be seen at Dr Plotinov's later this week. He is also urged to keep this appointment with his urologist in 3 days regarding microscopic hematuria. Hypotension likely secondary to blood loss versus opioid pain medicine Diagnoses #1 postoperative pain #2 hypotension #3  hyperglycemia #4 hyponatremia #5 microscopic hematuria #6 anemia Final diagnoses:  None        Orlie Dakin, MD 09/26/15 1727

## 2015-09-26 NOTE — Discharge Instructions (Signed)
Keep your scheduled appointment with your orthopedic physician on 10/16/2015. Also keep your appointment with your urologist in 3 days. You have trace microscopic amounts of blood in your urine and we feel that your urine should be rechecked. Dr.Plotikov's office will call you to schedule point for this week to recheck lab work. Blood work shows hemoglobin of 9.1 which is consistent with anemia, blood sugar of 173 which is elevated and blood sodium of 125 which is low. If you don't hear from Dr.Plotnikov by tomorrow afternoon, call to schedule appointment and tell office staff that [place] spoke with Dr.Plotnikov about your case.

## 2015-09-27 DIAGNOSIS — N401 Enlarged prostate with lower urinary tract symptoms: Secondary | ICD-10-CM | POA: Diagnosis not present

## 2015-09-27 DIAGNOSIS — N5201 Erectile dysfunction due to arterial insufficiency: Secondary | ICD-10-CM | POA: Diagnosis not present

## 2015-09-27 DIAGNOSIS — R338 Other retention of urine: Secondary | ICD-10-CM | POA: Diagnosis not present

## 2015-09-28 ENCOUNTER — Other Ambulatory Visit (INDEPENDENT_AMBULATORY_CARE_PROVIDER_SITE_OTHER): Payer: Medicare Other

## 2015-09-28 ENCOUNTER — Encounter: Payer: Self-pay | Admitting: Internal Medicine

## 2015-09-28 ENCOUNTER — Ambulatory Visit (INDEPENDENT_AMBULATORY_CARE_PROVIDER_SITE_OTHER): Payer: Medicare Other | Admitting: Internal Medicine

## 2015-09-28 VITALS — BP 110/72 | HR 77 | Wt 161.0 lb

## 2015-09-28 DIAGNOSIS — D500 Iron deficiency anemia secondary to blood loss (chronic): Secondary | ICD-10-CM | POA: Insufficient documentation

## 2015-09-28 DIAGNOSIS — M05711 Rheumatoid arthritis with rheumatoid factor of right shoulder without organ or systems involvement: Secondary | ICD-10-CM

## 2015-09-28 DIAGNOSIS — M25579 Pain in unspecified ankle and joints of unspecified foot: Secondary | ICD-10-CM | POA: Insufficient documentation

## 2015-09-28 DIAGNOSIS — M25571 Pain in right ankle and joints of right foot: Secondary | ICD-10-CM

## 2015-09-28 DIAGNOSIS — R739 Hyperglycemia, unspecified: Secondary | ICD-10-CM

## 2015-09-28 DIAGNOSIS — E871 Hypo-osmolality and hyponatremia: Secondary | ICD-10-CM

## 2015-09-28 DIAGNOSIS — I2510 Atherosclerotic heart disease of native coronary artery without angina pectoris: Secondary | ICD-10-CM

## 2015-09-28 DIAGNOSIS — R252 Cramp and spasm: Secondary | ICD-10-CM

## 2015-09-28 DIAGNOSIS — R5383 Other fatigue: Secondary | ICD-10-CM

## 2015-09-28 DIAGNOSIS — J438 Other emphysema: Secondary | ICD-10-CM

## 2015-09-28 LAB — CBC WITH DIFFERENTIAL/PLATELET
Basophils Absolute: 0 10*3/uL (ref 0.0–0.1)
Basophils Relative: 0.3 % (ref 0.0–3.0)
Eosinophils Absolute: 0 10*3/uL (ref 0.0–0.7)
Eosinophils Relative: 0.6 % (ref 0.0–5.0)
HCT: 28.4 % — ABNORMAL LOW (ref 39.0–52.0)
Hemoglobin: 9.6 g/dL — ABNORMAL LOW (ref 13.0–17.0)
Lymphocytes Relative: 10.8 % — ABNORMAL LOW (ref 12.0–46.0)
Lymphs Abs: 0.7 10*3/uL (ref 0.7–4.0)
MCHC: 33.7 g/dL (ref 30.0–36.0)
MCV: 92.8 fl (ref 78.0–100.0)
Monocytes Absolute: 0.7 10*3/uL (ref 0.1–1.0)
Monocytes Relative: 10.6 % (ref 3.0–12.0)
Neutro Abs: 5.1 10*3/uL (ref 1.4–7.7)
Neutrophils Relative %: 77.7 % — ABNORMAL HIGH (ref 43.0–77.0)
Platelets: 324 10*3/uL (ref 150.0–400.0)
RBC: 3.06 Mil/uL — ABNORMAL LOW (ref 4.22–5.81)
RDW: 13.2 % (ref 11.5–15.5)
WBC: 6.6 10*3/uL (ref 4.0–10.5)

## 2015-09-28 LAB — BASIC METABOLIC PANEL WITH GFR
BUN: 17 mg/dL (ref 6–23)
CO2: 33 meq/L — ABNORMAL HIGH (ref 19–32)
Calcium: 8.6 mg/dL (ref 8.4–10.5)
Chloride: 94 meq/L — ABNORMAL LOW (ref 96–112)
Creatinine, Ser: 0.85 mg/dL (ref 0.40–1.50)
GFR: 95.15 mL/min (ref >60.00)
Glucose, Bld: 107 mg/dL — ABNORMAL HIGH (ref 70–99)
Potassium: 3.8 meq/L (ref 3.5–5.1)
Sodium: 131 meq/L — ABNORMAL LOW (ref 135–145)

## 2015-09-28 LAB — IBC PANEL
Iron: 36 ug/dL — ABNORMAL LOW (ref 42–165)
Saturation Ratios: 17.1 % — ABNORMAL LOW (ref 20.0–50.0)
Transferrin: 150 mg/dL — ABNORMAL LOW (ref 212.0–360.0)

## 2015-09-28 LAB — HEMOGLOBIN A1C: Hgb A1c MFr Bld: 6 % (ref 4.6–6.5)

## 2015-09-28 LAB — TSH: TSH: 1.1 u[IU]/mL (ref 0.35–4.50)

## 2015-09-28 NOTE — Assessment & Plan Note (Signed)
No angina 

## 2015-09-28 NOTE — Assessment & Plan Note (Signed)
6/17 S/p R foot surgery at [place] (09/21/15) - could be cast related - better w/cast off No DVT Hold pravastatin

## 2015-09-28 NOTE — Assessment & Plan Note (Signed)
S/p R foot surgery at [place] (09/21/15), hematuria - anemia

## 2015-09-28 NOTE — Patient Instructions (Addendum)
Hold pravastatin  Restrict water intake

## 2015-09-28 NOTE — Assessment & Plan Note (Signed)
S/p R foot surgery at [place] (09/21/15), hematuria  Monitor labs

## 2015-09-28 NOTE — Assessment & Plan Note (Addendum)
S/p R foot surgery at [facility] (09/21/15) Prednisone 5 mg/d

## 2015-09-28 NOTE — Assessment & Plan Note (Signed)
Pulm ref Dr Elsworth Soho

## 2015-09-28 NOTE — Progress Notes (Signed)
Pre visit review using our clinic review tool, if applicable. No additional management support is needed unless otherwise documented below in the visit note. 

## 2015-09-28 NOTE — Assessment & Plan Note (Signed)
S/p R foot surgery at [facility] (09/21/15) - reduce water intake

## 2015-09-28 NOTE — Progress Notes (Signed)
Subjective:  Patient ID: [name], male    DOB: April 19, 1945  Age: 70 y.o. MRN: [id number]  CC: No chief complaint on file.   HPI [name] presents for post-ER f/up. He was seen on 6/20 for R leg cramps to r/o DVT. He had blood in the urine (Dr Jeffie Pollock - pt couldn't void pos- R foot surgery at [place] (09/21/15) and had a catheter), anemia.  Outpatient Prescriptions Prior to Visit  Medication Sig Dispense Refill  . albuterol (PROVENTIL HFA;VENTOLIN HFA) 108 (90 Base) MCG/ACT inhaler Inhale 1-2 puffs into the lungs every 4 (four) hours as needed for wheezing or shortness of breath.    . alfuzosin (UROXATRAL) 10 MG 24 hr tablet Take 10 mg by mouth daily.  0  . ALPRAZolam (XANAX) 0.5 MG tablet TAKE 1 TABLET BY MOUTH TWICE A DAY AS NEEDED FOR ANXIETY 60 tablet 2  . Cholecalciferol (VITAMIN D3) 2000 UNITS capsule Take 1 capsule (2,000 Units total) by mouth daily. 100 capsule 3  . CIALIS 5 MG tablet Take 1 tablet (5 mg total) by mouth daily. 100 tablet 11  . clopidogrel (PLAVIX) 75 MG tablet TAKE 1 TABLET BY MOUTH EVERY DAY WITH BREAKFAST 90 tablet 1  . FLUoxetine (PROZAC) 40 MG capsule TAKE ONE CAPSULE BY MOUTH TWICE A DAY 60 capsule 5  . fluticasone (FLONASE) 50 MCG/ACT nasal spray Place 2 sprays into both nostrils daily. (Patient taking differently: Place 2 sprays into both nostrils daily as needed for allergies. ) 16 g 4  . gabapentin (NEURONTIN) 300 MG capsule Take 300-600 mg by mouth 2 (two) times daily. Take 300 mg in the morning and 677m in the evening    . HYDROmorphone (DILAUDID) 2 MG tablet Take 2 mg by mouth every 4 (four) hours as needed for moderate pain.     .Marland Kitchenlosartan-hydrochlorothiazide (HYZAAR) 50-12.5 MG tablet TAKE 1 TABLET BY MOUTH DAILY 90 tablet 1  . Melatonin 5 MG TABS Take 5 mg by mouth at bedtime.    . methocarbamol (ROBAXIN) 500 MG tablet Take 1 tablet (500 mg total) by mouth 2 (two) times daily. 60 tablet 5  . metoprolol tartrate (LOPRESSOR) 25 MG tablet  TAKE 1 TABLET BY MOUTH TWICE DAILY 180 tablet 1  . Omega-3 Fatty Acids (FISH OIL OMEGA-3) 1000 MG CAPS Take 1,000-2,000 mg by mouth 2 (two) times daily. Take 2000 mg in the morning and 1000 mg at night    . ondansetron (ZOFRAN) 4 MG tablet Take 1 tablet (4 mg total) by mouth every 8 (eight) hours as needed for nausea or vomiting. 20 tablet 1  . pantoprazole (PROTONIX) 40 MG tablet TAKE 1 TABLET BY MOUTH TWICE A DAY 60 tablet 11  . pravastatin (PRAVACHOL) 20 MG tablet TAKE 1 TABLET EVERY DAY 30 tablet 11  . predniSONE (DELTASONE) 5 MG tablet Take 158mdaily  3   No facility-administered medications prior to visit.    ROS Review of Systems  Constitutional: Negative for appetite change, fatigue and unexpected weight change.  HENT: Negative for congestion, nosebleeds, sneezing, sore throat and trouble swallowing.   Eyes: Negative for itching and visual disturbance.  Respiratory: Negative for cough.   Cardiovascular: Negative for chest pain, palpitations and leg swelling.  Gastrointestinal: Negative for nausea, diarrhea, blood in stool and abdominal distention.  Genitourinary: Negative for frequency and hematuria.  Musculoskeletal: Positive for gait problem. Negative for back pain, joint swelling and neck pain.  Skin: Negative for rash.  Neurological: Negative for dizziness,  tremors, speech difficulty and weakness.  Psychiatric/Behavioral: Positive for decreased concentration. Negative for sleep disturbance, dysphoric mood and agitation. The patient is nervous/anxious.     Objective:  There were no vitals taken for this visit.  BP Readings from Last 3 Encounters:  09/26/15 108/93  08/28/15 152/90  08/17/15 124/74    Wt Readings from Last 3 Encounters:  08/28/15 160 lb (72.576 kg)  08/17/15 165 lb (74.844 kg)  07/07/15 164 lb 6.4 oz (74.571 kg)    Physical Exam  Constitutional: He is oriented to person, place, and time. He appears well-developed. No distress.  NAD  HENT:    Mouth/Throat: Oropharynx is clear and moist.  Eyes: Conjunctivae are normal. Pupils are equal, round, and reactive to light.  Neck: Normal range of motion. No JVD present. No thyromegaly present.  Cardiovascular: Normal rate, regular rhythm, normal heart sounds and intact distal pulses.  Exam reveals no gallop and no friction rub.   No murmur heard. Pulmonary/Chest: Effort normal and breath sounds normal. No respiratory distress. He has no wheezes. He has no rales. He exhibits no tenderness.  Abdominal: Soft. Bowel sounds are normal. He exhibits no distension and no mass. There is no tenderness. There is no rebound and no guarding.  Musculoskeletal: Normal range of motion. He exhibits tenderness. He exhibits no edema.  Lymphadenopathy:    He has no cervical adenopathy.  Neurological: He is alert and oriented to person, place, and time. He has normal reflexes. No cranial nerve deficit. He exhibits normal muscle tone. He displays a negative Romberg sign. Coordination and gait normal.  Skin: Skin is warm and dry. No rash noted.  Psychiatric: He has a normal mood and affect. His behavior is normal. Judgment and thought content normal.  Splint on the R leg  Lab Results  Component Value Date   WBC 6.6 09/26/2015   HGB 9.5* 09/26/2015   HCT 27.1* 09/26/2015   PLT 254 09/26/2015   GLUCOSE 173* 09/26/2015   CHOL 128 03/10/2015   TRIG 95.0 03/10/2015   HDL 39.30 03/10/2015   LDLCALC 70 03/10/2015   ALT 15 03/10/2015   AST 16 03/10/2015   NA 125* 09/26/2015   K 4.6 09/26/2015   CL 92* 09/26/2015   CREATININE 1.00 09/26/2015   BUN 20 09/26/2015   CO2 27 09/26/2015   TSH 1.04 08/17/2015   PSA 1.95 10/15/2010   INR 1.1* 06/14/2014   HGBA1C 6.0 12/31/2013    No results found.  Assessment & Plan:   There are no diagnoses linked to this encounter. I am having Mr. Dunnigan maintain his ondansetron, pantoprazole, methocarbamol, Vitamin D3, fluticasone, ALPRAZolam, gabapentin, albuterol,  FLUoxetine, pravastatin, clopidogrel, metoprolol tartrate, losartan-hydrochlorothiazide, predniSONE, CIALIS, FISH OIL OMEGA-3, HYDROmorphone, alfuzosin, and Melatonin.  No orders of the defined types were placed in this encounter.     Follow-up: No Follow-up on file.  [name], MD

## 2015-09-28 NOTE — Assessment & Plan Note (Signed)
S/p R foot surgery at [place] (09/21/15)

## 2015-10-06 ENCOUNTER — Other Ambulatory Visit: Payer: Self-pay | Admitting: Physician Assistant

## 2015-10-06 DIAGNOSIS — M1120 Other chondrocalcinosis, unspecified site: Secondary | ICD-10-CM | POA: Diagnosis not present

## 2015-10-06 DIAGNOSIS — M150 Primary generalized (osteo)arthritis: Secondary | ICD-10-CM | POA: Diagnosis not present

## 2015-10-06 DIAGNOSIS — M5136 Other intervertebral disc degeneration, lumbar region: Secondary | ICD-10-CM | POA: Diagnosis not present

## 2015-10-06 DIAGNOSIS — M0579 Rheumatoid arthritis with rheumatoid factor of multiple sites without organ or systems involvement: Secondary | ICD-10-CM | POA: Diagnosis not present

## 2015-10-06 DIAGNOSIS — M25531 Pain in right wrist: Secondary | ICD-10-CM | POA: Diagnosis not present

## 2015-10-06 DIAGNOSIS — R9389 Abnormal findings on diagnostic imaging of other specified body structures: Secondary | ICD-10-CM

## 2015-10-06 DIAGNOSIS — R938 Abnormal findings on diagnostic imaging of other specified body structures: Secondary | ICD-10-CM | POA: Diagnosis not present

## 2015-10-06 DIAGNOSIS — D6489 Other specified anemias: Secondary | ICD-10-CM | POA: Diagnosis not present

## 2015-10-06 DIAGNOSIS — R0600 Dyspnea, unspecified: Secondary | ICD-10-CM | POA: Diagnosis not present

## 2015-10-11 ENCOUNTER — Other Ambulatory Visit: Payer: Self-pay | Admitting: Neurology

## 2015-10-13 ENCOUNTER — Ambulatory Visit
Admission: RE | Admit: 2015-10-13 | Discharge: 2015-10-13 | Disposition: A | Discharge status: Home / Self Care | Payer: Medicare Other | Source: Ambulatory Visit | Attending: Physician Assistant | Admitting: Physician Assistant

## 2015-10-13 DIAGNOSIS — R918 Other nonspecific abnormal finding of lung field: Secondary | ICD-10-CM | POA: Diagnosis not present

## 2015-10-13 DIAGNOSIS — R9389 Abnormal findings on diagnostic imaging of other specified body structures: Secondary | ICD-10-CM

## 2015-10-13 MED ORDER — IOPAMIDOL (ISOVUE-300) INJECTION 61%
75.0000 mL | Freq: Once | INTRAVENOUS | Status: AC | PRN
  Administered 2015-10-13: 75 mL via INTRAVENOUS

## 2015-10-16 DIAGNOSIS — M19079 Primary osteoarthritis, unspecified ankle and foot: Secondary | ICD-10-CM | POA: Diagnosis not present

## 2015-10-25 DIAGNOSIS — R0600 Dyspnea, unspecified: Secondary | ICD-10-CM | POA: Diagnosis not present

## 2015-10-25 DIAGNOSIS — M1120 Other chondrocalcinosis, unspecified site: Secondary | ICD-10-CM | POA: Diagnosis not present

## 2015-10-25 DIAGNOSIS — M25531 Pain in right wrist: Secondary | ICD-10-CM | POA: Diagnosis not present

## 2015-10-25 DIAGNOSIS — D6489 Other specified anemias: Secondary | ICD-10-CM | POA: Diagnosis not present

## 2015-10-25 DIAGNOSIS — M150 Primary generalized (osteo)arthritis: Secondary | ICD-10-CM | POA: Diagnosis not present

## 2015-10-25 DIAGNOSIS — M0579 Rheumatoid arthritis with rheumatoid factor of multiple sites without organ or systems involvement: Secondary | ICD-10-CM | POA: Diagnosis not present

## 2015-10-25 DIAGNOSIS — M5136 Other intervertebral disc degeneration, lumbar region: Secondary | ICD-10-CM | POA: Diagnosis not present

## 2015-10-26 ENCOUNTER — Encounter: Payer: Self-pay | Admitting: Internal Medicine

## 2015-10-26 ENCOUNTER — Ambulatory Visit (INDEPENDENT_AMBULATORY_CARE_PROVIDER_SITE_OTHER): Payer: Medicare Other | Admitting: Internal Medicine

## 2015-10-26 VITALS — BP 120/70 | HR 60 | Wt 161.0 lb

## 2015-10-26 DIAGNOSIS — G479 Sleep disorder, unspecified: Secondary | ICD-10-CM

## 2015-10-26 DIAGNOSIS — M05711 Rheumatoid arthritis with rheumatoid factor of right shoulder without organ or systems involvement: Secondary | ICD-10-CM

## 2015-10-26 DIAGNOSIS — R0600 Dyspnea, unspecified: Secondary | ICD-10-CM

## 2015-10-26 DIAGNOSIS — I2510 Atherosclerotic heart disease of native coronary artery without angina pectoris: Secondary | ICD-10-CM | POA: Diagnosis not present

## 2015-10-26 DIAGNOSIS — E43 Unspecified severe protein-calorie malnutrition: Secondary | ICD-10-CM

## 2015-10-26 DIAGNOSIS — R0609 Other forms of dyspnea: Secondary | ICD-10-CM

## 2015-10-26 DIAGNOSIS — I10 Essential (primary) hypertension: Secondary | ICD-10-CM | POA: Diagnosis not present

## 2015-10-26 MED ORDER — HYDROMORPHONE HCL 2 MG PO TABS: 2.0000 mg | ORAL_TABLET | Freq: Three times a day (TID) | ORAL | Status: DC | PRN

## 2015-10-26 NOTE — Patient Instructions (Signed)
GERD pillow, body pillow

## 2015-10-26 NOTE — Assessment & Plan Note (Addendum)
Lopressor, Plavix, Pravachol - off

## 2015-10-26 NOTE — Progress Notes (Signed)
Pre visit review using our clinic review tool, if applicable. No additional management support is needed unless otherwise documented below in the visit note. 

## 2015-10-26 NOTE — Assessment & Plan Note (Signed)
Lopressor and Losartan

## 2015-10-26 NOTE — Assessment & Plan Note (Signed)
GERD wedge

## 2015-10-26 NOTE — Assessment & Plan Note (Signed)
Pulm ref pending

## 2015-10-26 NOTE — Assessment & Plan Note (Signed)
Better on Dilaudid  Potential benefits of a long term opioids use as well as potential risks (i.e. addiction risk, apnea etc) and complications (i.e. Somnolence, constipation and others) were explained to the patient and were aknowledged.

## 2015-10-26 NOTE — Progress Notes (Signed)
Subjective:  Patient ID: [name], male    DOB: 02-20-1946  Age: 70 y.o. MRN: [id number]  CC: No chief complaint on file.   HPI JESE COMELLA presents for R foot surgery at [facility] s/p (he is on Hydromorphone from [place]), f/u RA, anxiety, LBP. Doing well on Dilaudid 2 mg 2-3/d; Predn 10 mg/d now...  Outpatient Prescriptions Prior to Visit  Medication Sig Dispense Refill  . albuterol (PROVENTIL HFA;VENTOLIN HFA) 108 (90 Base) MCG/ACT inhaler Inhale 1-2 puffs into the lungs every 4 (four) hours as needed for wheezing or shortness of breath.    . alfuzosin (UROXATRAL) 10 MG 24 hr tablet Take 10 mg by mouth daily.  0  . ALPRAZolam (XANAX) 0.5 MG tablet TAKE 1 TABLET BY MOUTH TWICE A DAY AS NEEDED FOR ANXIETY 60 tablet 2  . Cholecalciferol (VITAMIN D3) 2000 UNITS capsule Take 1 capsule (2,000 Units total) by mouth daily. 100 capsule 3  . CIALIS 5 MG tablet Take 1 tablet (5 mg total) by mouth daily. 100 tablet 11  . clopidogrel (PLAVIX) 75 MG tablet TAKE 1 TABLET BY MOUTH EVERY DAY WITH BREAKFAST 90 tablet 1  . FLUoxetine (PROZAC) 40 MG capsule TAKE ONE CAPSULE BY MOUTH TWICE A DAY 60 capsule 5  . fluticasone (FLONASE) 50 MCG/ACT nasal spray Place 2 sprays into both nostrils daily. (Patient taking differently: Place 2 sprays into both nostrils daily as needed for allergies. ) 16 g 4  . gabapentin (NEURONTIN) 300 MG capsule Take 300-600 mg by mouth 2 (two) times daily. Take 300 mg in the morning and 668m in the evening    . losartan-hydrochlorothiazide (HYZAAR) 50-12.5 MG tablet TAKE 1 TABLET BY MOUTH DAILY 90 tablet 1  . Melatonin 5 MG TABS Take 5 mg by mouth at bedtime.    . methocarbamol (ROBAXIN) 500 MG tablet TAKE 1 TABLET BY MOUTH TWICE DAILY 60 tablet 0  . metoprolol tartrate (LOPRESSOR) 25 MG tablet TAKE 1 TABLET BY MOUTH TWICE DAILY 180 tablet 1  . Omega-3 Fatty Acids (FISH OIL OMEGA-3) 1000 MG CAPS Take 1,000-2,000 mg by mouth 2 (two) times daily. Take 2000 mg in the morning  and 1000 mg at night    . ondansetron (ZOFRAN) 4 MG tablet Take 1 tablet (4 mg total) by mouth every 8 (eight) hours as needed for nausea or vomiting. 20 tablet 1  . pantoprazole (PROTONIX) 40 MG tablet TAKE 1 TABLET BY MOUTH TWICE A DAY 60 tablet 11  . predniSONE (DELTASONE) 5 MG tablet Take 130mdaily  3  . pravastatin (PRAVACHOL) 20 MG tablet TAKE 1 TABLET EVERY DAY (Patient not taking: Reported on 10/26/2015) 30 tablet 11   No facility-administered medications prior to visit.    ROS Review of Systems  Constitutional: Negative for appetite change, fatigue and unexpected weight change.  HENT: Negative for congestion, nosebleeds, sneezing, sore throat and trouble swallowing.   Eyes: Negative for itching and visual disturbance.  Respiratory: Negative for cough.   Cardiovascular: Negative for chest pain, palpitations and leg swelling.  Gastrointestinal: Negative for nausea, diarrhea, blood in stool and abdominal distention.  Genitourinary: Negative for frequency and hematuria.  Musculoskeletal: Positive for back pain, arthralgias and gait problem. Negative for joint swelling and neck pain.  Skin: Negative for rash.  Neurological: Negative for dizziness, tremors, speech difficulty and weakness.  Psychiatric/Behavioral: Negative for suicidal ideas, confusion, sleep disturbance, dysphoric mood and agitation. The patient is not nervous/anxious.     Objective:  BP 120/70 mmHg  Pulse 60  Wt 161 lb (73.029 kg)  SpO2 97%  BP Readings from Last 3 Encounters:  10/26/15 120/70  09/28/15 110/72  09/26/15 108/93    Wt Readings from Last 3 Encounters:  10/26/15 161 lb (73.029 kg)  09/28/15 161 lb (73.029 kg)  08/28/15 160 lb (72.576 kg)    Physical Exam  Constitutional: He is oriented to person, place, and time. He appears well-developed. No distress.  NAD  HENT:  Mouth/Throat: Oropharynx is clear and moist.  Eyes: Conjunctivae are normal. Pupils are equal, round, and reactive to  light.  Neck: Normal range of motion. No JVD present. No thyromegaly present.  Cardiovascular: Normal rate, regular rhythm, normal heart sounds and intact distal pulses.  Exam reveals no gallop and no friction rub.   No murmur heard. Pulmonary/Chest: Effort normal and breath sounds normal. No respiratory distress. He has no wheezes. He has no rales. He exhibits no tenderness.  Abdominal: Soft. Bowel sounds are normal. He exhibits no distension and no mass. There is no tenderness. There is no rebound and no guarding.  Musculoskeletal: Normal range of motion. He exhibits tenderness. He exhibits no edema.  Lymphadenopathy:    He has no cervical adenopathy.  Neurological: He is alert and oriented to person, place, and time. He has normal reflexes. No cranial nerve deficit. He exhibits normal muscle tone. He displays a negative Romberg sign. Coordination abnormal. Gait normal.  Skin: Skin is warm and dry. No rash noted.  Psychiatric: He has a normal mood and affect. His behavior is normal. Judgment and thought content normal.  RLE is in the cast On a knee scooter  Lab Results  Component Value Date   WBC 6.6 09/28/2015   HGB 9.6* 09/28/2015   HCT 28.4* 09/28/2015   PLT 324.0 09/28/2015   GLUCOSE 107* 09/28/2015   CHOL 128 03/10/2015   TRIG 95.0 03/10/2015   HDL 39.30 03/10/2015   LDLCALC 70 03/10/2015   ALT 15 03/10/2015   AST 16 03/10/2015   NA 131* 09/28/2015   K 3.8 09/28/2015   CL 94* 09/28/2015   CREATININE 0.85 09/28/2015   BUN 17 09/28/2015   CO2 33* 09/28/2015   TSH 1.10 09/28/2015   PSA 1.95 10/15/2010   INR 1.1* 06/14/2014   HGBA1C 6.0 09/28/2015    Ct Chest W Contrast  10/13/2015  CLINICAL DATA:  Elevated hemidiaphragm on chest radiography. EXAM: CT CHEST WITH CONTRAST TECHNIQUE: Multidetector CT imaging of the chest was performed during intravenous contrast administration. CONTRAST:  51m ISOVUE-300 IOPAMIDOL (ISOVUE-300) INJECTION 61% COMPARISON:  03/28/2015 and  multiple previous FINDINGS: Mediastinum/Lymph Nodes: No enlarged hilar or mediastinal lymph nodes. Aortic atherosclerosis. No aneurysm or dissection. Coronary artery atherosclerosis. No pericardial fluid. Chronically elevated right hemidiaphragm. Lungs/Pleura: No pleural fluid. Mild chronic volume loss at the right base secondary to the elevated hemidiaphragm. The lungs are otherwise clear. Upper abdomen: Unremarkable except for benign appearing cysts of the right kidney. 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 Lower thoracic spinal fusion. IMPRESSION: Chronic elevation of the right hemidiaphragm. Chronic volume loss at the right lung base. No pleural or pericardial fluid. Aortic atherosclerosis.  Coronary artery atherosclerosis. Electronically Signed   By: MNelson ChimesM.D.   On: 10/13/2015 16:13    Assessment & Plan:   There are no diagnoses linked to this encounter. I am having Mr. Vizcarrondo maintain his ondansetron, pantoprazole, Vitamin D3, fluticasone, ALPRAZolam, gabapentin, albuterol, FLUoxetine, pravastatin, clopidogrel, metoprolol tartrate, losartan-hydrochlorothiazide, predniSONE, CIALIS, FISH OIL OMEGA-3,  alfuzosin, Melatonin, methocarbamol, COLCRYS, HYDROmorphone, and oxyCODONE-acetaminophen.  Meds ordered this encounter  Medications  . COLCRYS 0.6 MG tablet    Sig: Take 1 tablet by mouth 2 (two) times daily.    Refill:  2  . HYDROmorphone (DILAUDID) 2 MG tablet    Sig: TK 1 T PO Q 4 TO 6 H PRN    Refill:  0  . oxyCODONE-acetaminophen (PERCOCET/ROXICET) 5-325 MG tablet    Sig: Reported on 10/26/2015    Refill:  0     Follow-up: No Follow-up on file.  [name], MD

## 2015-11-06 DIAGNOSIS — M19071 Primary osteoarthritis, right ankle and foot: Secondary | ICD-10-CM | POA: Diagnosis not present

## 2015-11-08 ENCOUNTER — Other Ambulatory Visit: Payer: Self-pay | Admitting: Internal Medicine

## 2015-11-09 ENCOUNTER — Telehealth: Payer: Self-pay | Admitting: *Deleted

## 2015-11-09 NOTE — Telephone Encounter (Signed)
Duplicate request refill has already been done...[name]

## 2015-11-15 ENCOUNTER — Other Ambulatory Visit: Payer: Self-pay | Admitting: Internal Medicine

## 2015-11-15 ENCOUNTER — Other Ambulatory Visit: Payer: Self-pay | Admitting: Neurology

## 2015-11-23 ENCOUNTER — Encounter: Payer: Self-pay | Admitting: Cardiovascular Disease

## 2015-11-29 ENCOUNTER — Other Ambulatory Visit: Payer: Self-pay | Admitting: Internal Medicine

## 2015-12-01 NOTE — Telephone Encounter (Signed)
Refill called into [facility] had to leave on pharmacy vm...[name]

## 2015-12-05 ENCOUNTER — Encounter: Payer: Self-pay | Admitting: Cardiovascular Disease

## 2015-12-17 IMAGING — CR DG ABD PORTABLE 1V
2 series · 2 of 2 positions shown · non-contrast
Comparison: None.

CLINICAL DATA: Abdominal pain

EXAM:
PORTABLE ABDOMEN - 1 VIEW

[AP (1 of 2)]
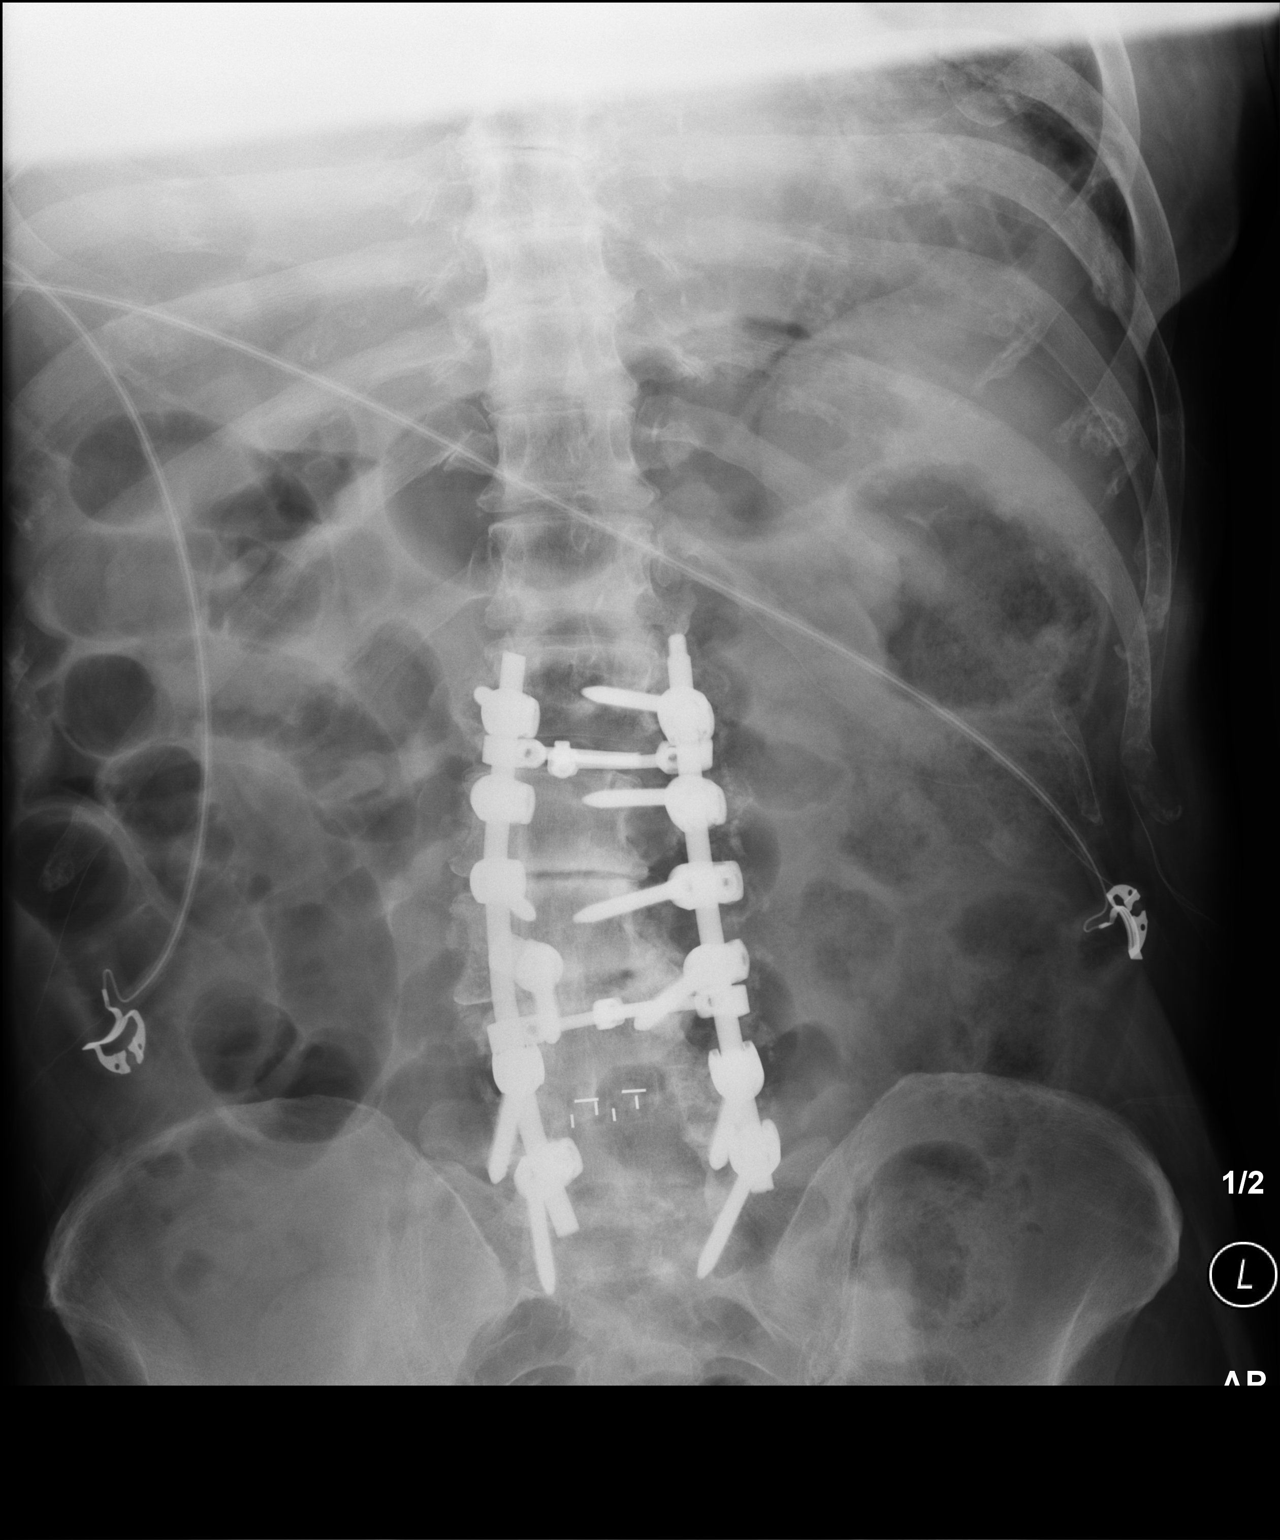

[AP (2 of 2)]
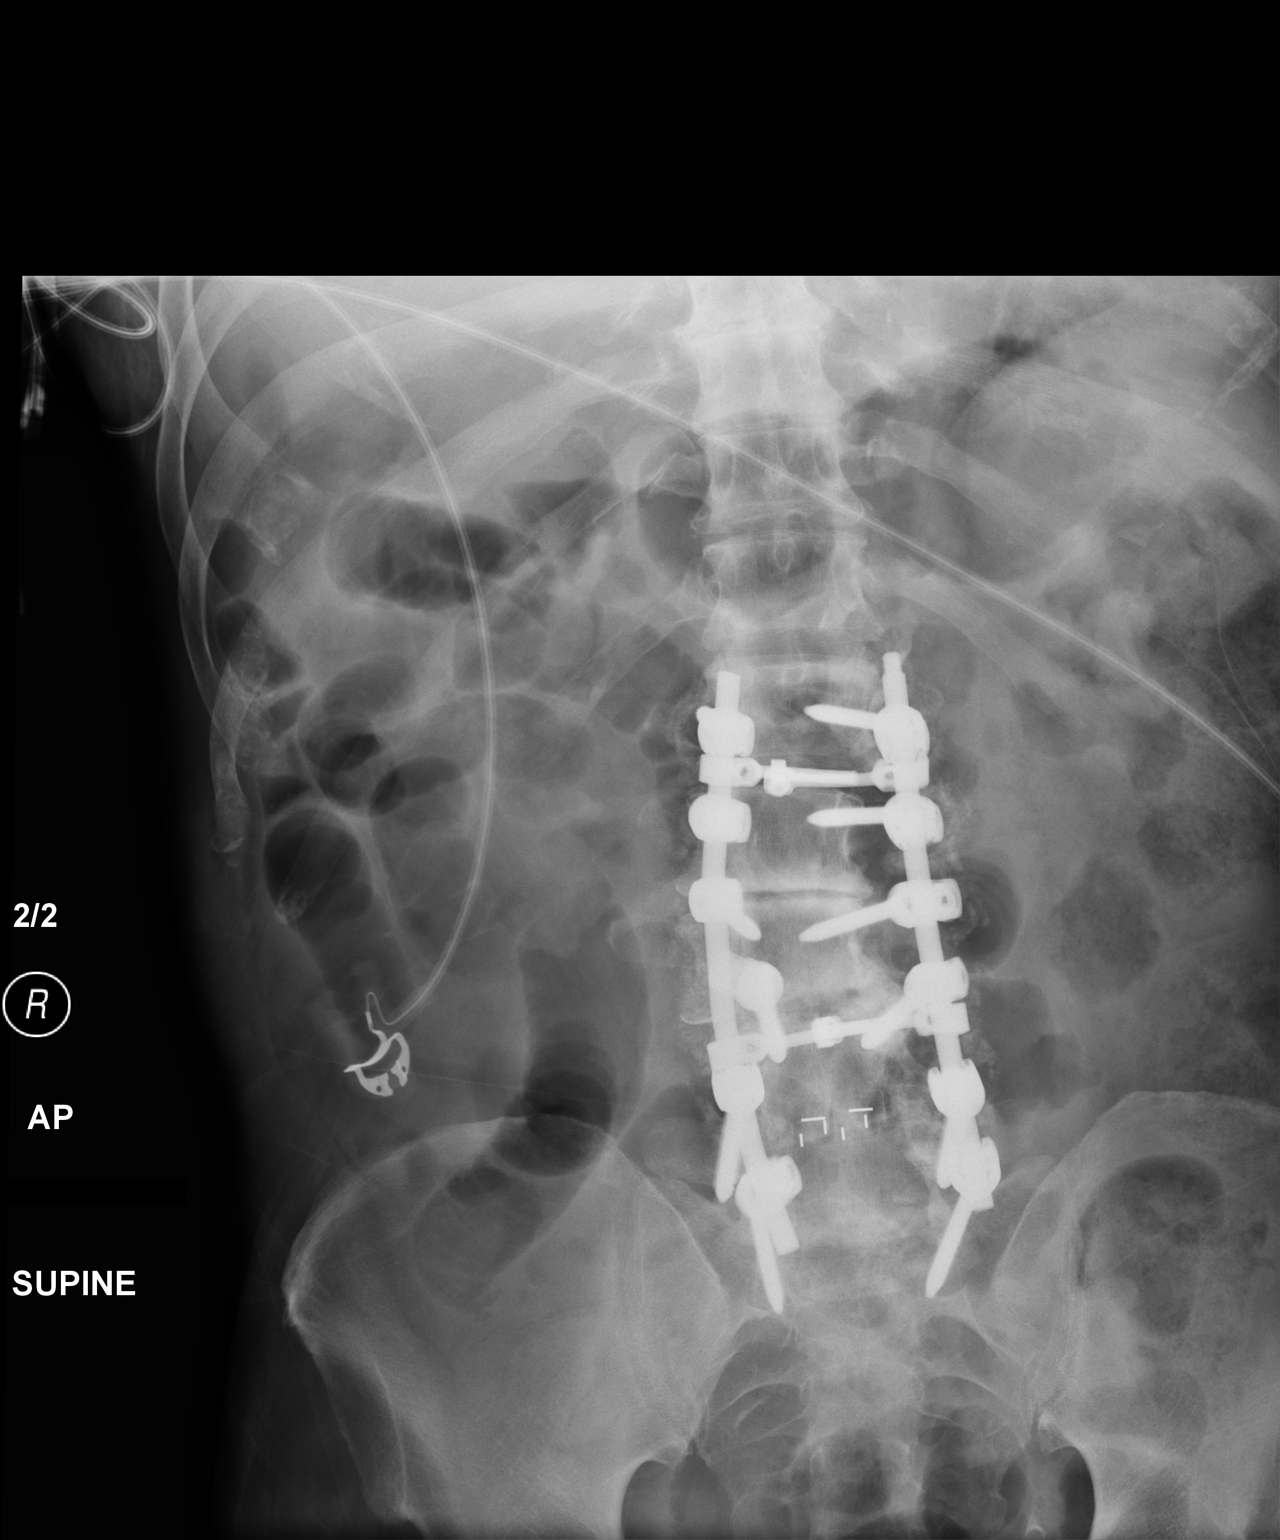

[2 of 2 positions shown; findings below may reference images not displayed]

FINDINGS: Postsurgical changes are noted in the lower lumbar spine. Scattered
large and small bowel gas is noted although no to obstructive
changes are seen. No free air is noted. No abnormal mass or abnormal
calcifications are seen.
IMPRESSION: Postoperative changes.

Scattered large and small bowel gas without obstructive change.

## 2015-12-19 IMAGING — CT CT ABD-PELV W/ CM
2 of 5 series · 14 of 46 positions shown, 16 images · IV contrast (APPLIED)
Comparison: 08/11/2012 CT.

CLINICAL DATA: Abdominal pain. Post appendectomy and inguinal
hernia repair.

EXAM:
CT ABDOMEN AND PELVIS WITH CONTRAST
TECHNIQUE: Multidetector CT imaging of the abdomen and pelvis was performed
using the standard protocol following bolus administration of
intravenous contrast.
CONTRAST:  100mL OMNIPAQUE IOHEXOL 300 MG/ML  SOLN

[Series 2: abd/ pelvis 5.0 i30f 1 · axial · 0.73mm/px · z∈[+1002,+1452]mm · 11 of 102 slices shown, 13 images]
[im 6/102  soft-tissue]
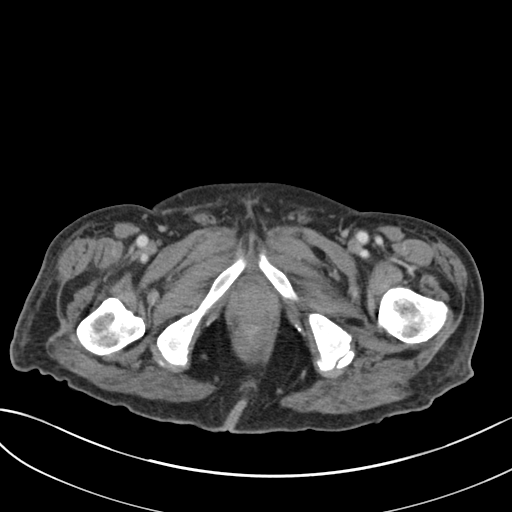
[im 6/102  bone]
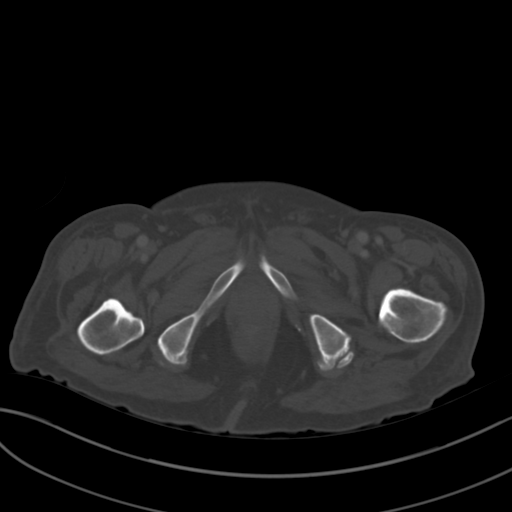
[im 17/102  soft-tissue]
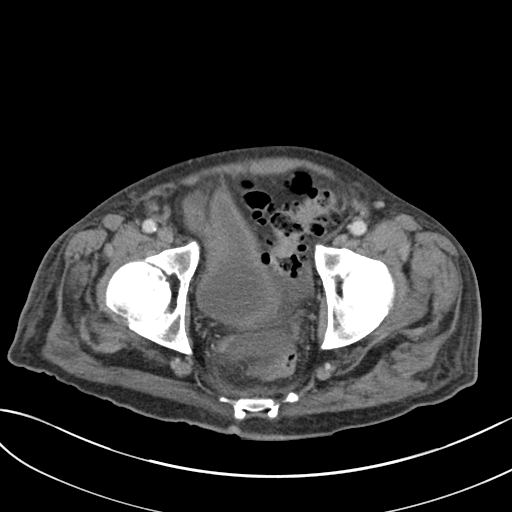
[im 23/102  soft-tissue]
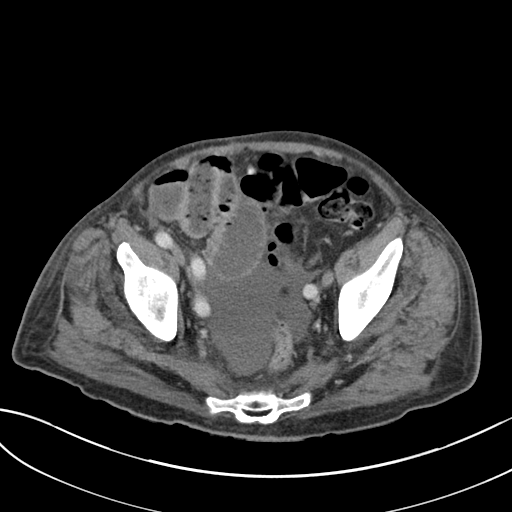
[im 34/102  soft-tissue]
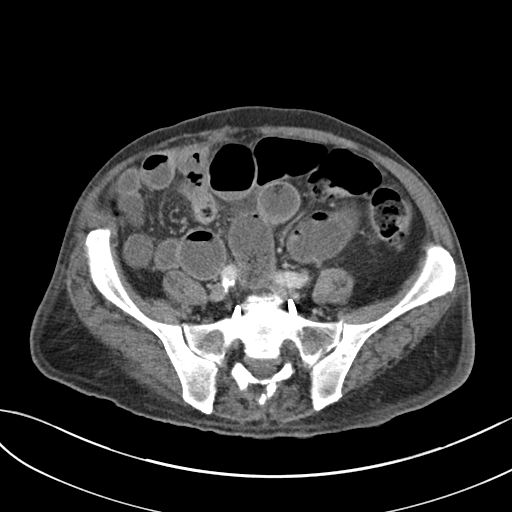
[im 40/102  soft-tissue]
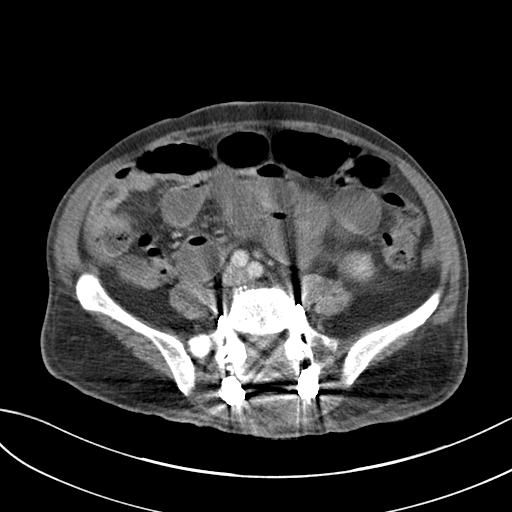
[im 51/102  soft-tissue]
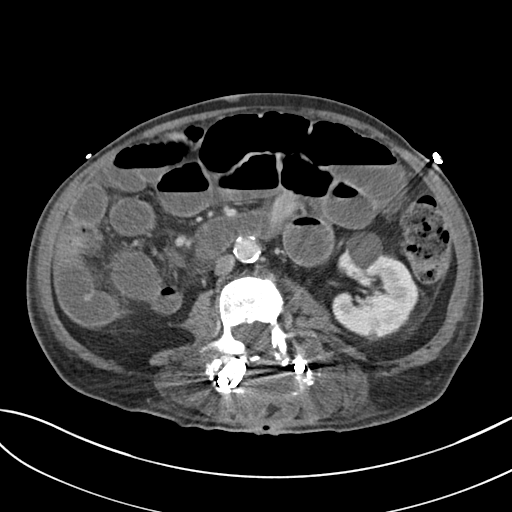
[im 62/102  soft-tissue]
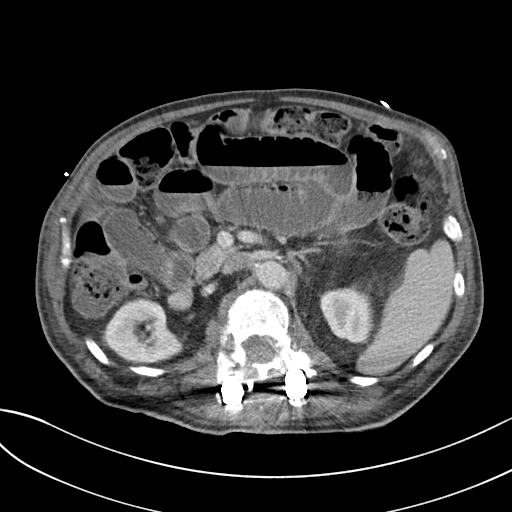
[im 68/102  soft-tissue]
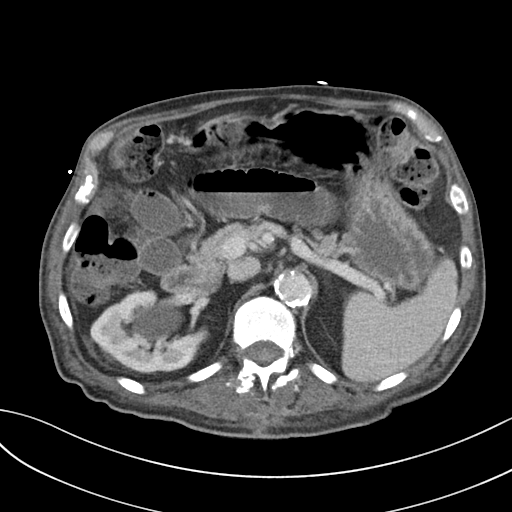
[im 79/102  soft-tissue]
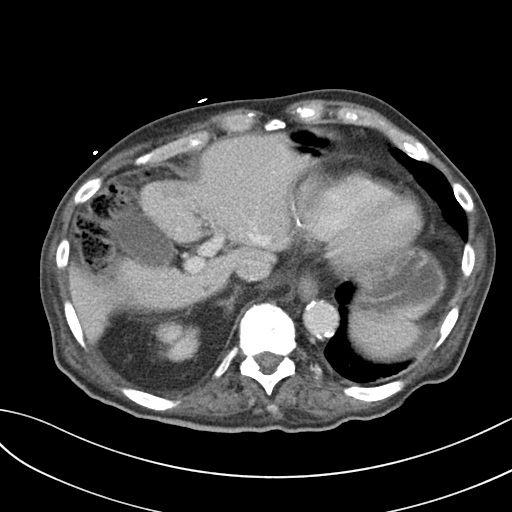
[im 79/102  bone]
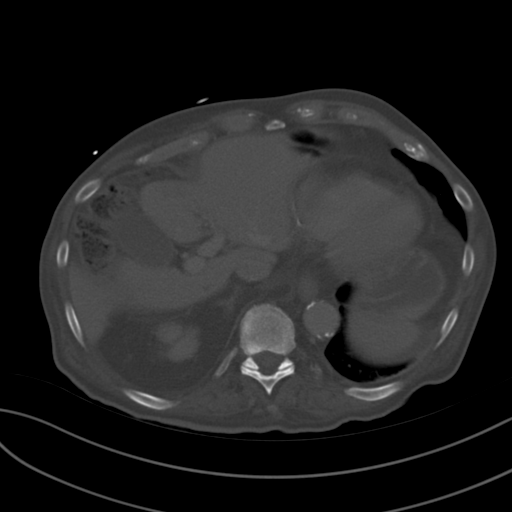
[im 85/102  soft-tissue]
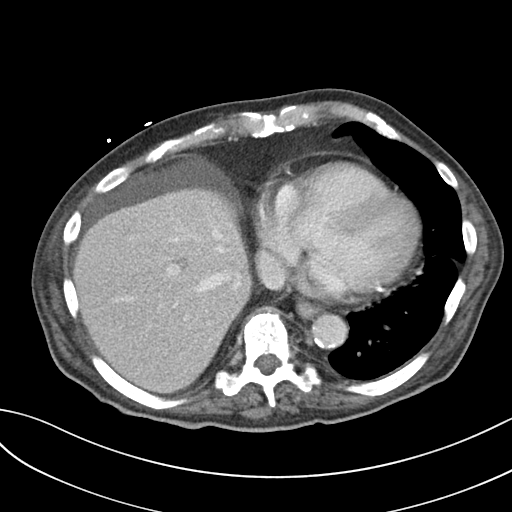
[im 96/102  soft-tissue]
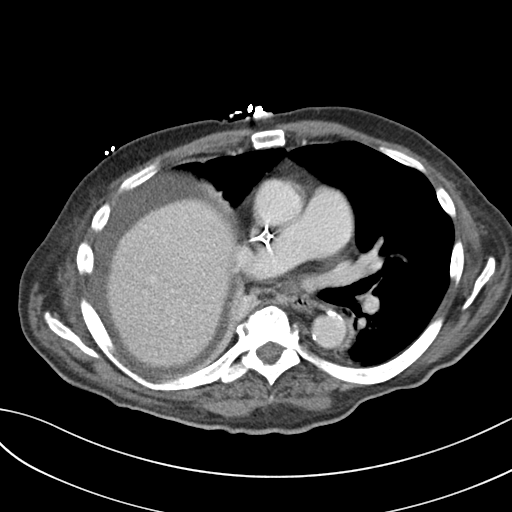

[Series 5: cor · coronal · 0.75mm/px · 3 of 164 slices shown]
[im 55/164  soft-tissue]
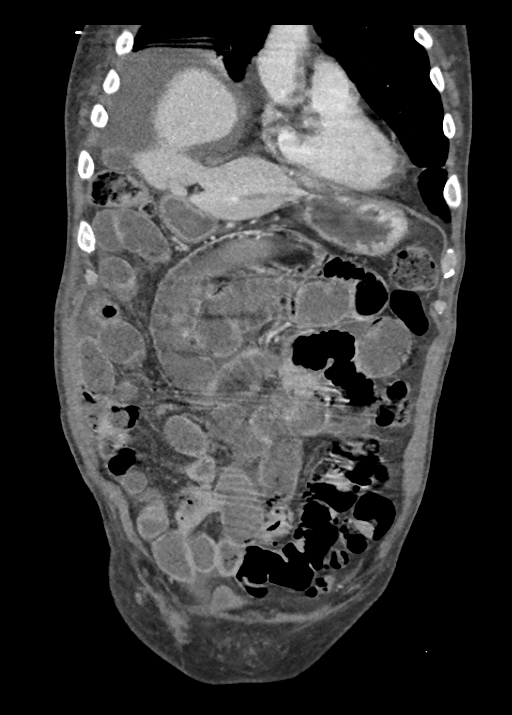
[im 73/164  soft-tissue]
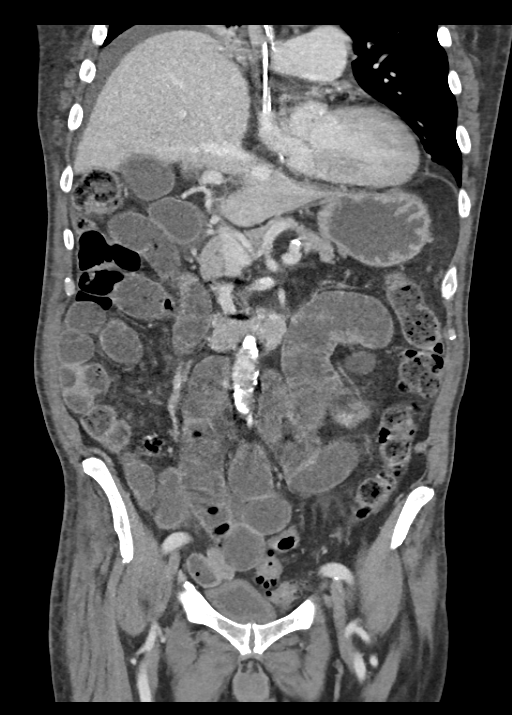
[im 91/164  soft-tissue]
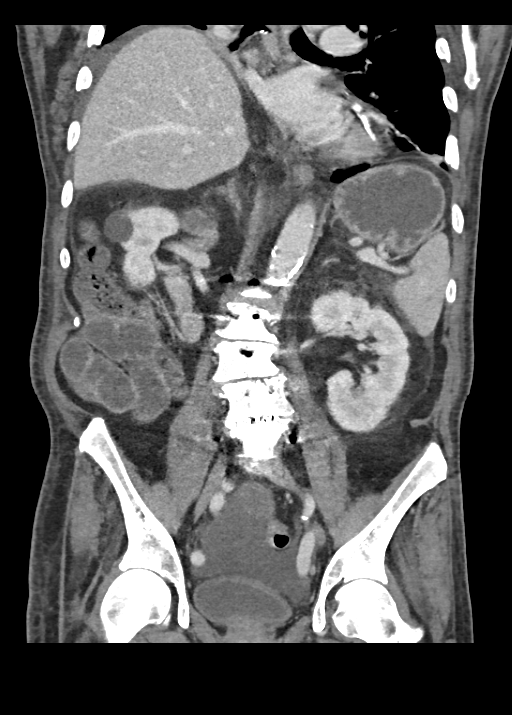

[14 of 46 positions shown; findings below may reference images not displayed]

FINDINGS: Gas and fluid-filled dilated small bowel loops to level of the right
lower quadrant where there is narrowing of distal small bowel loops
with mild circumferential thickening which may represent the
presence of an adhesion/stricture. Although less likely, primary
mass is not excluded. Ascites without evidence of free
intraperitoneal air.

Small fatty containing right inguinal hernia.

Right lower lobe consolidation may represent infiltrate and/
atelectasis. Recommend followup until clearance. Elevated right
hemidiaphragm. This was noted previously.

Minimal amount of fluid inferior aspect left major fissure.

Right central line tip caval atrial junction/proximal right atrium.

Heart size within normal limits. Prominent coronary artery
calcifications.

Atherosclerotic type changes of the abdominal aorta with prominent
calcified plaque and mild narrowing also involves the descending
thoracic aorta, abdominal aortic branch vessels and iliac/femoral
arteries. Prominent narrowing superior mesenteric artery and
moderate narrowing celiac artery. 1.1 cm splenic artery aneurysm.

Prominent sigmoid and descending colon diverticulosis.

Interval lumbar spine surgery with fusion L1 through S1. No solid
bony fusion noted at the L1-2, L2-3, L3-4 and L5-S1 level. Anterior
slippage of L4. The right the L1 pedicle screw does not enter the
pedicle, located laterally with surrounding loosening. Loosening
left L1 pedicle screws suspected. The left L1 pedicle screw
partially enters the left aspect of the canal. L3 pedicle screws
partially enter canal more notable on the right. Right L4 pedicle
screw partially enters the canal. L5 pedicle screws noted along the
lateral aspect of the pedicle traversing the lateral anterior
cortex. S1 pedicle screws traverse the anterior cortex. Prominent
calcified structure posterior to the S1 vertebral body greater to
left of midline.

Bilateral renal lesions larger ones which are cysts whereas some of
the small ones cannot be adequately characterize. Large right
parapelvic cyst.

No calcified gallstone. No worrisome hepatic, splenic, pancreatic or
adrenal lesion.
IMPRESSION: Small bowel obstruction with gas and fluid-filled dilated small
bowel loops to level of the right lower quadrant where there is
narrowing of distal small bowel loops with mild circumferential
thickening which may represent the presence of an
adhesion/stricture. Although less likely, primary mass is not
excluded. Ascites.

Small fatty containing right inguinal hernia.

Prominent sigmoid and descending colon diverticulosis.

Right lower lobe consolidation may represent infiltrate and/
atelectasis.

Prominent coronary artery calcifications.

Atherosclerotic type changes of the abdominal aorta and aortic
branch vessels as detailed above. 1.1 cm splenic artery aneurysm.

Interval lumbar spine surgery with fusion L1 through S1. Please see
above discussion as bony fusion is not confirmed on the current exam
and there may be loosening of pedicle screws.

Please see above.

These results will be called to the ordering clinician or
representative by the Radiologist Assistant, and communication
documented in the PACS Dashboard.

## 2015-12-19 IMAGING — CR DG ABD PORTABLE 1V
1 series · 1 of 1 positions shown · non-contrast
Comparison: 04/19/2013.

CLINICAL DATA: Nasogastric tube placement.

EXAM:
PORTABLE ABDOMEN - 1 VIEW

[AP]
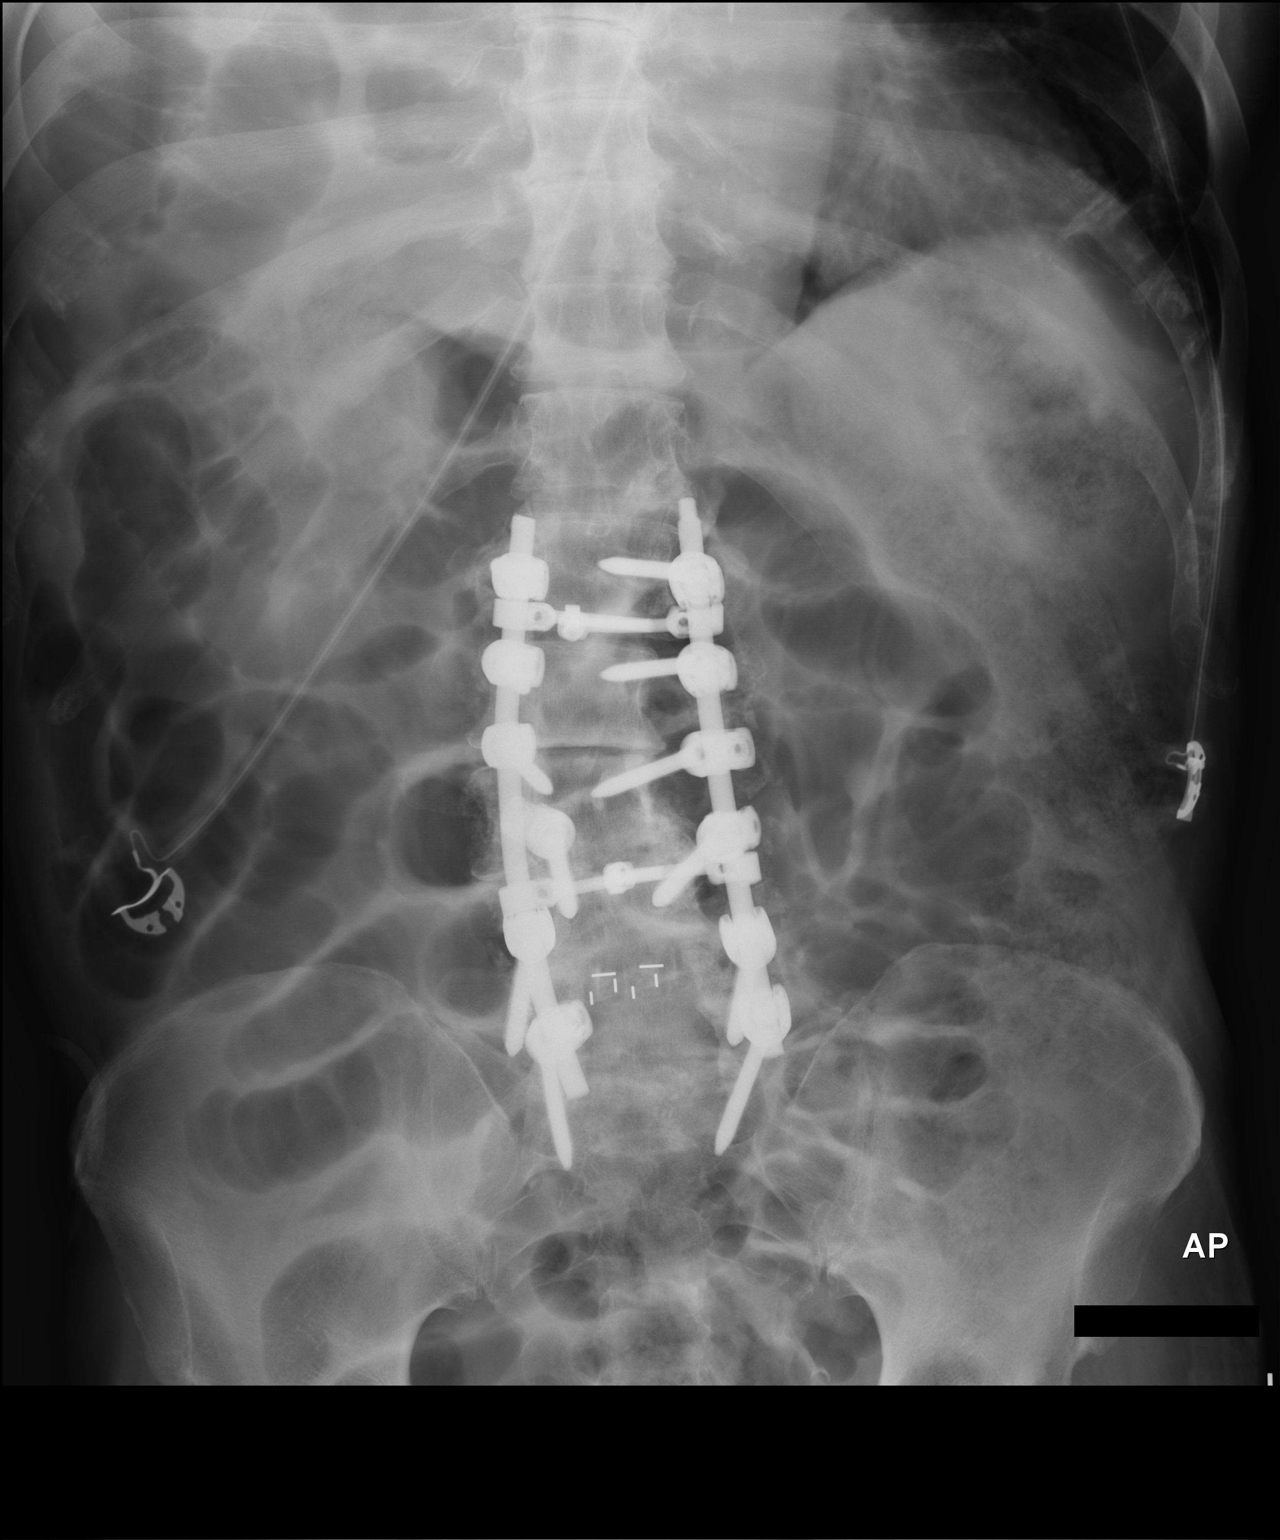

[1 of 1 positions shown; findings below may reference images not displayed]

FINDINGS: There is no nasogastric tube visualized. Telemetry leads are present
over the abdomen. Gaseous distension of small bowel, with dilation
in the right upper quadrant and pelvis. Postoperative changes of
lumbar spine surgery.
IMPRESSION: No visible nasogastric tube on this abdominal radiograph. Consider
chest radiograph to assess location or Re-insert and repeat
abdominal film.

## 2015-12-20 ENCOUNTER — Ambulatory Visit: Payer: Medicare Other | Admitting: Cardiovascular Disease

## 2015-12-20 ENCOUNTER — Ambulatory Visit: Payer: Medicare Other | Admitting: Physician Assistant

## 2015-12-25 ENCOUNTER — Encounter: Payer: Self-pay | Admitting: Pulmonary Disease

## 2015-12-25 ENCOUNTER — Ambulatory Visit (INDEPENDENT_AMBULATORY_CARE_PROVIDER_SITE_OTHER): Payer: Medicare Other | Admitting: Pulmonary Disease

## 2015-12-25 DIAGNOSIS — J986 Disorders of diaphragm: Secondary | ICD-10-CM | POA: Diagnosis not present

## 2015-12-25 DIAGNOSIS — I2510 Atherosclerotic heart disease of native coronary artery without angina pectoris: Secondary | ICD-10-CM | POA: Diagnosis not present

## 2015-12-25 DIAGNOSIS — Z96661 Presence of right artificial ankle joint: Secondary | ICD-10-CM | POA: Diagnosis not present

## 2015-12-25 DIAGNOSIS — M7989 Other specified soft tissue disorders: Secondary | ICD-10-CM | POA: Diagnosis not present

## 2015-12-25 DIAGNOSIS — M19071 Primary osteoarthritis, right ankle and foot: Secondary | ICD-10-CM | POA: Diagnosis not present

## 2015-12-25 NOTE — Progress Notes (Signed)
Subjective:    Patient ID: [name], male    DOB: 1945-10-08, 70 y.o.   MRN: [id number]  HPI  Chief Complaint  Patient presents with  . Pulmonary Consult    Referred by Plotnikov; very sob with activity, having  several surgeries done and Orthopedist advised him to see Pulmonary specialist before having more surgeries.  CAT: 51   70 year old never smoker, on chronic steroids for rheumatoid arthritis presents for preop evaluation. He has had multiple surgeries and complications of hypoxia and hypertension after some of them- in 2015 he had extensive back surgery followed by postoperative hypotension that improved with stress dose steroids. He had right foot surgery 2 months ago and postoperatively had hypoxia and was advised to see a pulmonologist. He is scheduled to have more surgery on his left foot in October. I note an evaluation for chronic cough and 12/2010 by my partner Wert. There is no childhood history of asthma or venous thromboembolism in the past.  He follows with Dr. Amil Amen for rheumatoid arthritis, had good response to Humira-but this was stopped after he developed infection after back surgery. He has been on chronic steroids for many years  Reviewed imaging dating back to 2004 that shows right hemidiaphragm elevation PFTs 2011 show moderate restriction with FVC 65%, TLC 76% and DLCO 98% suggesting extra parenchymal cause  CT chest 10/2015 shows mild right lower lobe atelectasis and right hemidiaphragm elevation   Past Medical History:  Diagnosis Date  . Alcoholism /alcohol abuse ([place])    per family  . Allergic rhinitis   . Anxiety   . Bacterial infection   . CAD (coronary artery disease)    minimal coronary plaque in the LAD and right coronary system. 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 7-06. Last nuclear stress 11-17-06 fixed anterior/ inferior defect, no inducible ischemia, EF 81%  .  Chronic back pain    "all over back"  . COPD (chronic obstructive pulmonary disease) ([place])   . Depression   . Diverticulosis   . Dysrhythmia 01-24-12   past hx. A.Fib x1 episode-responded to med.  . Falls frequently    "since 02/2013" (06/16/2013)  . GERD (gastroesophageal reflux disease)   . Hemochromatosis    dx'd 14 yrs ago last ferritin Aug 11, 08 52 (22-322), Fe 136 ("I had 250 phlebotomies for that")  . High cholesterol    hx  . Hx of colonic polyps   . Hx of colonoscopy   . Hypertension   . Migraine    "for 3 months in 2015; related to infection in my back" (06/17/2014)  . Myocardial infarction [facility]) 2006   "related to catheterization"  . Narcotic abuse    per family  . Osteoarthritis   . PONV (postoperative nausea and vomiting)   . RA (rheumatoid arthritis) ([place])   . Sleep apnea    "never dx'd;" (06/17/2014))   Past Surgical History:  Procedure Laterality Date  . ABDOMINAL ADHESION SURGERY  ~ 1968  . ANKLE RECONSTRUCTION Right 6-09   [facility]  . APPENDECTOMY  ~ 1956  . BONE TUMOR RESECTION  ~ 1954   "taken off my mastoid"  . CARPAL TUNNEL RELEASE Right 1990's  . CATARACT EXTRACTION, BILATERAL Bilateral 01-24-12  . CORONARY ANGIOPLASTY WITH STENT PLACEMENT  2006   "while repairing 1st stent, a second area tore and they had to place 2nd stent " ?LAD & CX  . CORONARY ANGIOPLASTY WITH STENT PLACEMENT  06/17/2014  . CYST EXCISION  "several OR's"   "backX 2, back of my neck, face, inside right bicept, chest, wrist"  . FOOT SURGERY Right 11-08   for removal of bone spurs-  . FRACTURE SURGERY    . HAMMER TOE SURGERY Right 07/2012   "broke 4 hammertoes"   . HARDWARE REMOVAL  03/09/2012   Procedure: HARDWARE REMOVAL;  Surgeon: Nita Sells, MD;  Location: [place];  Service: Orthopedics;  Laterality: Left;  Hardware Removal from Left Shoulder  . HARVEST BONE GRAFT  02/06/2012   Procedure: HARVEST ILIAC BONE GRAFT;  Surgeon: Nita Sells,  MD;  [facility];  Service: Orthopedics;;  bone marrow aspirqation   . INGUINAL HERNIA REPAIR Bilateral   . JOINT REPLACEMENT    . KNEE SURGERY Left ~ 2003   "6-12 months after uni knee removed synovial sack"  . LEFT HEART CATHETERIZATION WITH CORONARY ANGIOGRAM N/A 06/17/2014   PCI of diffuse severe stenosis in the proximal to mid LAD using overlapping drug-eluting stents.  . LUMBAR WOUND DEBRIDEMENT N/A 03/17/2013   Procedure: Incision and drainage of superficial lumbar wound;  Surgeon: Floyce Stakes, MD;  Location: [place] [facility];  Service: Neurosurgery;  Laterality: N/A;  Incision and drainage of superficial lumbar wound  . MECKEL DIVERTICULUM EXCISION  ~ 1956  . ORIF SHOULDER FRACTURE  02/06/2012   Procedure: OPEN REDUCTION INTERNAL FIXATION (ORIF) SHOULDER FRACTURE;  Surgeon: Nita Sells, MD;  [facility];  Service: Orthopedics;  Laterality: Left;  ORIF of a Left Shoulder Fracture with  Iliac Crest Bone Graft aspiration   . POSTERIOR LUMBAR FUSION  12-10   L4-5 diskectomy w/ fusion, cage placement and rods; [facility]  . POSTERIOR LUMBAR FUSION 4 LEVEL N/A 03/02/2013   Procedure: Lumbar One to Sacral One Posterior lumbar interbody fusion;  Surgeon: Floyce Stakes, MD;  Location: [place] [facility];  Service: Neurosurgery;  Laterality: N/A;  L1 to S1 Posterior lumbar interbody fusion  . REPLACEMENT UNICONDYLAR JOINT KNEE Left ~ 2003   "~ 6 months after total knee replaced"  . SHOULDER ARTHROSCOPY Left ~ 2004 X 2   "@ [facility]; left bone splinter in & had to clean it out"  . TOTAL ANKLE REPLACEMENT Right 2008   [facility]  . TOTAL KNEE ARTHROPLASTY Bilateral 2002  . TOTAL SHOULDER REPLACEMENT Left 2006  . TOTAL SHOULDER REPLACEMENT Right ~ 2007   Dr. Marlou Sa   Allergies  Allergen Reactions  . Cefepime Hives and Shortness Of Breath  . Morphine And Related Shortness Of Breath, Nausea And Vomiting, Swelling and Other (See Comments)    Agitation, tolerates dilaudid  .  Penicillins Hives, Shortness Of Breath and Rash    Has patient had a PCN reaction causing immediate rash, facial/tongue/throat swelling, SOB or lightheadedness with hypotension: Yes Has patient had a PCN reaction causing severe rash involving mucus membranes or skin necrosis: Yes Has patient had a PCN reaction that required hospitalization No Has patient had a PCN reaction occurring within the last 10 years: No If all of the above answers are "NO", then may proceed with Cephalosporin use.   [name] Percocet [Oxycodone-Acetaminophen] Anxiety    Takes OxyContin at home.     Social History   Social History  . Marital status: Divorced    Spouse name: N/A  . Number of children: 2  . Years of education: 16   Occupational History  . 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 Smoking status: Never Smoker  . Smokeless tobacco: Never Used  . Alcohol use No     Comment: "I do not drink anymore"  . Drug use: No  . Sexual activity: Yes    Partners: Female   Other Topics Concern  . Not on file   Social History Narrative   [employer] [place]. Married 1972-63yr seperated/divorced. Engaged April '11. 1 son '74; step-daughter '70. 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 During '11 discovered an [employer]- $500,000+ loss. Is handling this OK - will keep the business running.     Family History  Problem Relation Age of Onset  . Uterine cancer Mother     survivor  . Macular degeneration Mother   . Other Mother     ankle edema  . Lung cancer Mother   . Cancer Mother     ovarian, lung  . Coronary artery disease Father   . Hypertension Father   . Prostate cancer Father   . Colon polyps Father   . Cancer Father     prostate  . Heart attack Brother   . Hyperlipidemia Brother   . Other Brother     Schizophrenic  . Thyroid disease  Sister   . Coronary artery disease Maternal Aunt   . Heart attack Maternal Aunt   . Rheum arthritis Sister   . Hemochromatosis Sister   . Diabetes Neg Hx   . Colon cancer Neg Hx   . Esophageal cancer Neg Hx   . Rectal cancer Neg Hx   . Stomach cancer Neg Hx      Review of Systems  Constitutional: Negative for activity change, appetite change, chills, fever and unexpected weight change.  HENT: Positive for postnasal drip. Negative for congestion, dental problem, rhinorrhea, sneezing, sore throat, trouble swallowing and voice change.   Eyes: Negative for visual disturbance.  Respiratory: Positive for shortness of breath. Negative for cough and choking.   Cardiovascular: Negative for chest pain and leg swelling.  Gastrointestinal: Negative for abdominal pain, nausea and vomiting.  Genitourinary: Negative for difficulty urinating.  Musculoskeletal: Negative for arthralgias.  Skin: Negative for rash.  Psychiatric/Behavioral: Negative for behavioral problems and confusion.       Objective:   Physical Exam  Gen. Pleasant, well-nourished, in no distress, normal affect ENT - no lesions, no post nasal drip Neck: No JVD, no thyromegaly, no carotid bruits Lungs: no use of accessory muscles, no dullness to percussion, decreased without rales or rhonchi  Cardiovascular: Rhythm regular, heart sounds  normal, no murmurs or gallops, no peripheral edema Abdomen: soft and non-tender, no hepatosplenomegaly, BS normal. Musculoskeletal: No deformities, no cyanosis or clubbing, left knee & back scars of surgery Neuro:  alert, non focal       Assessment & Plan:

## 2015-12-25 NOTE — Assessment & Plan Note (Signed)
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 If oxygen levels drop, would consider overnight admission for monitoring

## 2015-12-25 NOTE — Patient Instructions (Signed)
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 If oxygen levels drop, would consider overnight admission for monitoring

## 2015-12-26 NOTE — Assessment & Plan Note (Signed)
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

## 2015-12-29 ENCOUNTER — Other Ambulatory Visit: Payer: Self-pay | Admitting: *Deleted

## 2015-12-29 MED ORDER — HYDROMORPHONE HCL 2 MG PO TABS: 2.0000 mg | ORAL_TABLET | Freq: Three times a day (TID) | ORAL | 0 refills | Status: DC | PRN

## 2015-12-29 NOTE — Telephone Encounter (Signed)
PCP gave verbal ok to Rf and advised OV q 3 months. Rx printed/signed/given to pt.

## 2015-12-29 NOTE — Telephone Encounter (Signed)
Pt walked in requesting Rf on Dilaudid. He is waiting in our lobby as he states he is leaving town this afternoon. Ok to Rf?

## 2016-01-08 ENCOUNTER — Ambulatory Visit (INDEPENDENT_AMBULATORY_CARE_PROVIDER_SITE_OTHER): Payer: Medicare Other | Admitting: Cardiovascular Disease

## 2016-01-08 ENCOUNTER — Encounter: Payer: Self-pay | Admitting: Cardiovascular Disease

## 2016-01-08 VITALS — BP 106/50 | HR 58 | Ht 66.0 in | Wt 161.8 lb

## 2016-01-08 DIAGNOSIS — M1120 Other chondrocalcinosis, unspecified site: Secondary | ICD-10-CM | POA: Diagnosis not present

## 2016-01-08 DIAGNOSIS — D6489 Other specified anemias: Secondary | ICD-10-CM | POA: Diagnosis not present

## 2016-01-08 DIAGNOSIS — M0579 Rheumatoid arthritis with rheumatoid factor of multiple sites without organ or systems involvement: Secondary | ICD-10-CM | POA: Diagnosis not present

## 2016-01-08 DIAGNOSIS — I2510 Atherosclerotic heart disease of native coronary artery without angina pectoris: Secondary | ICD-10-CM

## 2016-01-08 DIAGNOSIS — I480 Paroxysmal atrial fibrillation: Secondary | ICD-10-CM

## 2016-01-08 DIAGNOSIS — Z79899 Other long term (current) drug therapy: Secondary | ICD-10-CM | POA: Diagnosis not present

## 2016-01-08 DIAGNOSIS — R0600 Dyspnea, unspecified: Secondary | ICD-10-CM | POA: Diagnosis not present

## 2016-01-08 DIAGNOSIS — M5136 Other intervertebral disc degeneration, lumbar region: Secondary | ICD-10-CM | POA: Diagnosis not present

## 2016-01-08 DIAGNOSIS — M150 Primary generalized (osteo)arthritis: Secondary | ICD-10-CM | POA: Diagnosis not present

## 2016-01-08 NOTE — Patient Instructions (Addendum)

## 2016-01-08 NOTE — Progress Notes (Signed)
Patient ID: [name], male   DOB: 04/05/1946, 70 y.o.   MRN: [id number]    Cardiology Office Note    Date:  01/08/2016   ID:  [name], DOB 06-03-1945, MRN [id number]  PCP:  [name], MD  Cardiologist:  Dr. Jenkins Rouge   Electrophysiologist:  n/a  Chief Complaint  Patient presents with  . Coronary Artery Disease  . Medication Managment  . PAF    History of Present Illness:  [name] is a 70 y.o. male with a hx of CAD, paroxysmal atrial fibrillation not on systemic anticoagulation given need for DAPT, hyperlipidemia, COPD, GERD, obstructive sleep apnea, rheumatoid arthritis, and hemochromatosis. He had left circumflex stent with spiral dissection in 2006. He had TEE DCCV in May 2012. He had h/o post lumbar fusion surgical wound infection with positive blood culture of pseudomonas and required 6 weeks of IV antibiotics near the end of 2014. He was admitted in March 2016 with chest tightness and dyspnea and underwent cardiac catheterization which showed new stenosis in proximal to mid LAD treated with overlapping drug-eluting stents. He was started on aspirin and Plavix. Based on recent phone note, it seems the patient took himself off of Plavix for up to 4 days in May, he was advised to resume Plavix given recent complex DES. In June, patient contacted the cardiology office insisting on getting myelogram done due to severe pain. Dr. Johnsie Cancel cleared him to stop Plavix for 5 days prior to myelogram and resume as soon as possible. Based on most recent office note by his PCP, he is planning for R foot surgery (R midfoot arthodesis, revision of 2-4 MT head resection) for OA in Feb at [facility] by Dr. Pasty Spillers.   Last seen by PA 03/10/15  for preop clearance for R foot surgery.with DR Deorio at [facility]   He wanted to postpone until a year post stent He is now just taking plavix as ASA makes him bleed  Foot surgery with Dr Diorio [facility] on right needs left done next     Past Medical History:  Diagnosis Date  . Alcoholism /alcohol abuse ([place])    per family  . Allergic rhinitis   . Anxiety   . Bacterial infection   . CAD (coronary artery disease)    minimal coronary plaque in the LAD and right coronary system. 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 7-06. Last nuclear stress 11-17-06 fixed anterior/ inferior defect, no inducible ischemia, EF 81%  . Chronic back pain    "all over back"  . COPD (chronic obstructive pulmonary disease) ([place])   . Depression   . Diverticulosis   . Dysrhythmia 01-24-12   past hx. A.Fib x1 episode-responded to med.  . Falls frequently    "since 02/2013" (06/16/2013)  . GERD (gastroesophageal reflux disease)   . Hemochromatosis    dx'd 14 yrs ago last ferritin Aug 11, 08 52 (22-322), Fe 136 ("I had 250 phlebotomies for that")  . High cholesterol    hx  . Hx of colonic polyps   . Hx of colonoscopy   . Hypertension   . Migraine    "for 3 months in 2015; related to infection in my back" (06/17/2014)  . Myocardial infarction 2006   "related to catheterization"  . Narcotic abuse    per family  . Osteoarthritis   . PONV (postoperative nausea and vomiting)   . RA (rheumatoid arthritis) ([place])   .  Sleep apnea    "never dx'd;" (06/17/2014))    Past Surgical History:  Procedure Laterality Date  . ABDOMINAL ADHESION SURGERY  ~ 1968  . ANKLE RECONSTRUCTION Right 6-09   [facility]  . APPENDECTOMY  ~ 1956  . BONE TUMOR RESECTION  ~ 1954   "taken off my mastoid"  . CARPAL TUNNEL RELEASE Right 1990's  . CATARACT EXTRACTION, BILATERAL Bilateral 01-24-12  . CORONARY ANGIOPLASTY WITH STENT PLACEMENT  2006   "while repairing 1st stent, a second area tore and they had to place 2nd stent " ?LAD & CX  . CORONARY ANGIOPLASTY WITH STENT PLACEMENT  06/17/2014  . CYST EXCISION  "several OR's"   "backX 2, back of my neck, face, inside right bicept, chest, wrist"  . FOOT  SURGERY Right 11-08   for removal of bone spurs-  . FRACTURE SURGERY    . HAMMER TOE SURGERY Right 07/2012   "broke 4 hammertoes"   . HARDWARE REMOVAL  03/09/2012   Procedure: HARDWARE REMOVAL;  Surgeon: Nita Sells, MD;  Location: [place];  Service: Orthopedics;  Laterality: Left;  Hardware Removal from Left Shoulder  . HARVEST BONE GRAFT  02/06/2012   Procedure: HARVEST ILIAC BONE GRAFT;  Surgeon: Nita Sells, MD;  Location: [facility];  Service: Orthopedics;;  bone marrow aspirqation   . INGUINAL HERNIA REPAIR Bilateral   . JOINT REPLACEMENT    . KNEE SURGERY Left ~ 2003   "6-12 months after uni knee removed synovial sack"  . LEFT HEART CATHETERIZATION WITH CORONARY ANGIOGRAM N/A 06/17/2014   PCI of diffuse severe stenosis in the proximal to mid LAD using overlapping drug-eluting stents.  . LUMBAR WOUND DEBRIDEMENT N/A 03/17/2013   Procedure: Incision and drainage of superficial lumbar wound;  Surgeon: Floyce Stakes, MD;  Location: [place] [facility];  Service: Neurosurgery;  Laterality: N/A;  Incision and drainage of superficial lumbar wound  . MECKEL DIVERTICULUM EXCISION  ~ 1956  . ORIF SHOULDER FRACTURE  02/06/2012   Procedure: OPEN REDUCTION INTERNAL FIXATION (ORIF) SHOULDER FRACTURE;  Surgeon: Nita Sells, MD;  [facility];  Service: Orthopedics;  Laterality: Left;  ORIF of a Left Shoulder Fracture with  Iliac Crest Bone Graft aspiration   . POSTERIOR LUMBAR FUSION  12-10   L4-5 diskectomy w/ fusion, cage placement and rods; [facility]  . POSTERIOR LUMBAR FUSION 4 LEVEL N/A 03/02/2013   Procedure: Lumbar One to Sacral One Posterior lumbar interbody fusion;  Surgeon: Floyce Stakes, MD;  Location: [place] [facility];  Service: Neurosurgery;  Laterality: N/A;  L1 to S1 Posterior lumbar interbody fusion  . REPLACEMENT UNICONDYLAR JOINT KNEE Left ~ 2003   "~ 6 months after total knee replaced"  . SHOULDER ARTHROSCOPY Left ~ 2004 X 2   "@  [facility]; left bone splinter in & had to clean it out"  . TOTAL ANKLE REPLACEMENT Right 2008   at [facility]  . TOTAL KNEE ARTHROPLASTY Bilateral 2002  . TOTAL SHOULDER REPLACEMENT Left 2006  . TOTAL SHOULDER REPLACEMENT Right ~ 2007   Dr. Marlou Sa    Current Outpatient Prescriptions  Medication Sig Dispense Refill  . albuterol (PROVENTIL HFA;VENTOLIN HFA) 108 (90 Base) MCG/ACT inhaler Inhale 1-2 puffs into the lungs every 4 (four) hours as needed for wheezing or shortness of breath.    . alfuzosin (UROXATRAL) 10 MG 24 hr tablet Take 10 mg by mouth daily.  0  . ALPRAZolam (XANAX) 0.5 MG tablet TAKE 1 TABLET BY MOUTH TWICE DAILY  AS NEEDED FOR ANXIETY 60 tablet 1  . Cholecalciferol (VITAMIN D3) 2000 UNITS capsule Take 1 capsule (2,000 Units total) by mouth daily. 100 capsule 3  . clopidogrel (PLAVIX) 75 MG tablet TAKE 1 TABLET BY MOUTH EVERY DAY WITH BREAKFAST 90 tablet 1  . COLCRYS 0.6 MG tablet Take 1 tablet by mouth 2 (two) times daily.  2  . FLUoxetine (PROZAC) 40 MG capsule TAKE ONE CAPSULE BY MOUTH TWICE A DAY 60 capsule 5  . fluticasone (FLONASE) 50 MCG/ACT nasal spray Place 2 sprays into both nostrils daily. 16 g 4  . gabapentin (NEURONTIN) 300 MG capsule Take 300-600 mg by mouth 2 (two) times daily. Take 300 mg in the morning and 681m in the evening    . HYDROmorphone (DILAUDID) 2 MG tablet Take 1 tablet (2 mg total) by mouth 3 (three) times daily as needed for severe pain. 90 tablet 0  . losartan-hydrochlorothiazide (HYZAAR) 50-12.5 MG tablet TAKE 1 TABLET BY MOUTH DAILY 90 tablet 2  . Melatonin 5 MG TABS Take 5 mg by mouth at bedtime.    . metoprolol tartrate (LOPRESSOR) 25 MG tablet TAKE 1 TABLET BY MOUTH TWICE DAILY 180 tablet 1  . Omega-3 Fatty Acids (FISH OIL OMEGA-3) 1000 MG CAPS Take 1,000-2,000 mg by mouth 2 (two) times daily. Take 2000 mg in the morning and 1000 mg at night    . ondansetron (ZOFRAN) 4 MG tablet Take 1 tablet (4 mg total) by mouth every 8 (eight) hours as needed for  nausea or vomiting. 20 tablet 1  . pantoprazole (PROTONIX) 40 MG tablet TAKE 1 TABLET BY MOUTH TWICE DAILY 60 tablet 11  . pravastatin (PRAVACHOL) 20 MG tablet Take 20 mg by mouth daily.    . predniSONE (DELTASONE) 5 MG tablet Take 159mby mouth daily  3   No current facility-administered medications for this visit.     Allergies:   Cefepime; Morphine and related; Penicillins; and Percocet [oxycodone-acetaminophen]   Social History   Social History  . Marital status: Divorced    Spouse name: N/A  . Number of children: 2  . Years of education: 16   Occupational History  . 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 Smoking status: Never Smoker  . Smokeless tobacco: Never Used  . Alcohol use No     Comment: "I do not drink anymore"  . Drug use: No  . Sexual activity: Yes    Partners: Female   Other Topics Concern  . None   Social History Narrative   [employer] - [employer]. Married 1972-3513yreperated/divorced. Engaged April '11. 1 son '74; step-daughter '70. 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 During '11 [place] loss. Is handling this OK - will keep the business running.     Family History:  The patient's family history includes Cancer in his father and mother; Colon polyps in his father; Coronary artery disease in his father and maternal aunt; Heart attack in his brother and maternal aunt; Hemochromatosis in his sister; Hyperlipidemia in his brother; Hypertension in his father; Lung cancer in his mother; Macular degeneration in his mother; Other in his brother and mother; Prostate cancer in his father; Rheum arthritis in his sister; Thyroid disease in his sister; Uterine cancer in his mother.   ROS:   Please see the history of present  illness.    ROS All other systems reviewed and are  negative.   PHYSICAL EXAM:   VS:  BP (!) 106/50   Pulse (!) 58   Ht 5' 6"  (1.676 m)   Wt 73.4 kg (161 lb 12.8 oz)   SpO2 98%   BMI 26.12 kg/m    GEN: Well nourished, well developed, in no acute distress HEENT: normal Neck: no JVD,  carotid bruits, no masses Cardiac: Normal S1/S2, RRR; no murmurs, rubs, or gallops, no edema  Respiratory:  clear to auscultation bilaterally; no wheezing, rhonchi or rales GI: soft, nontender, nondistended, + BS MS: no deformity or atrophy Skin: warm and dry, no rash Neuro:  Bilateral strength equal, no focal deficits  Psych: Alert and oriented x 3, normal affect Right foot in boot post surgery   Wt Readings from Last 3 Encounters:  01/08/16 73.4 kg (161 lb 12.8 oz)  12/25/15 71.7 kg (158 lb)  10/26/15 73 kg (161 lb)      Studies/Labs Reviewed:   EKG:  03/28/15   It showed NSR rate 68  Normal   Recent Labs: 03/10/2015: ALT 15 09/28/2015: BUN 17; Creatinine, Ser 0.85; Hemoglobin 9.6; Platelets 324.0; Potassium 3.8; Sodium 131; TSH 1.10   Recent Lipid Panel    Component Value Date/Time   CHOL 128 03/10/2015 1008   TRIG 95.0 03/10/2015 1008   HDL 39.30 03/10/2015 1008   CHOLHDL 3 03/10/2015 1008   VLDL 19.0 03/10/2015 1008   [place] 70 03/10/2015 1008    Additional studies/ records that were reviewed today include:   LHC 06/17/14 Coronary angiography: Coronary dominance: right Left mainstem: Normal Left anterior descending (LAD): 80% proximal lesion before D1 50% just distal to D1 80% distal D1 30% multiple discrete Left circumflex (LCx): Primarily AV groove and OM1 Widely patent stents in OM Right coronary artery (RCA): Dominant 30% proximal 50% Mid vessel stenosis  Left ventriculography: Left ventricular systolic function is normal, LVEF is estimated at 55-65%, there is no significant mitral regurgitation  Final Conclusions: New LAD lesions patent stents in OM Recommendations: Reviewed with Dr Burt Knack PCI/Stent mulitple  sights in LAD  Conclusions: Successful PCI of diffuse severe stenosis in the proximal to mid LAD using overlapping drug-eluting stent platforms  Recommendations: Dual antiplatelet therapy with aspirin and Plavix for at least 12 months.   Lesion Data: Vessel: LAD/proximal to mid Percent stenosis (pre): 80 TIMI-flow (pre): 3 Stent: 2.5 x 24 mm Promus DES and 3.0 x 20 mm Promus DES (overlapping) Percent stenosis (post): 0 TIMI-flow (post): 3  Myoview 05/20/14 Low risk stress nuclear study Poor quality scan with marked gut uptake and diaphragmatic motion on RAW images. Cannot r/o inferolateral wall ischemia.  LV Ejection Fraction: 63%. LV Wall Motion: NL LV Function; NL Wall Motion   ASSESSMENT:    CAD   PLAN:  In order of problems listed above:  1. CAD s/p overlapping DESx2 to LAD on 06/17/2014 Previous stenting circumflex 2006 with spiral dissection : no angina, Continue plavix Since not taking ASA P2Y has been good in past   2. COPD: stable no active wheezing   3. paroxysmal atrial fibrillation not on systemic anticoagulation given need for DAPT  - s/p TEE DCCV in May 2012, previously on pradaxa, off pradaxa now given need for Plavix  4. Hyperlipidemia: LFT and lipid panel stable on 03/10/2015. Continue pravachol  5. obstructive sleep apnea  6. rheumatoid arthritis right foot surgery ok at this time if needed Ortho typically does not Stop  his plavix    Jenkins Rouge

## 2016-01-09 ENCOUNTER — Ambulatory Visit: Payer: Medicare Other | Admitting: Internal Medicine

## 2016-01-12 DIAGNOSIS — H43813 Vitreous degeneration, bilateral: Secondary | ICD-10-CM | POA: Diagnosis not present

## 2016-01-12 DIAGNOSIS — L720 Epidermal cyst: Secondary | ICD-10-CM | POA: Diagnosis not present

## 2016-01-12 DIAGNOSIS — D485 Neoplasm of uncertain behavior of skin: Secondary | ICD-10-CM | POA: Diagnosis not present

## 2016-01-12 DIAGNOSIS — D1801 Hemangioma of skin and subcutaneous tissue: Secondary | ICD-10-CM | POA: Diagnosis not present

## 2016-01-12 DIAGNOSIS — H35042 Retinal micro-aneurysms, unspecified, left eye: Secondary | ICD-10-CM | POA: Diagnosis not present

## 2016-01-12 DIAGNOSIS — Z961 Presence of intraocular lens: Secondary | ICD-10-CM | POA: Diagnosis not present

## 2016-01-12 DIAGNOSIS — Z85828 Personal history of other malignant neoplasm of skin: Secondary | ICD-10-CM | POA: Diagnosis not present

## 2016-01-12 DIAGNOSIS — D3132 Benign neoplasm of left choroid: Secondary | ICD-10-CM | POA: Diagnosis not present

## 2016-01-12 DIAGNOSIS — L821 Other seborrheic keratosis: Secondary | ICD-10-CM | POA: Diagnosis not present

## 2016-01-12 DIAGNOSIS — L814 Other melanin hyperpigmentation: Secondary | ICD-10-CM | POA: Diagnosis not present

## 2016-01-12 DIAGNOSIS — B070 Plantar wart: Secondary | ICD-10-CM | POA: Diagnosis not present

## 2016-01-12 DIAGNOSIS — L853 Xerosis cutis: Secondary | ICD-10-CM | POA: Diagnosis not present

## 2016-01-12 DIAGNOSIS — D692 Other nonthrombocytopenic purpura: Secondary | ICD-10-CM | POA: Diagnosis not present

## 2016-01-17 ENCOUNTER — Other Ambulatory Visit: Payer: Self-pay | Admitting: Internal Medicine

## 2016-01-17 ENCOUNTER — Telehealth: Payer: Self-pay | Admitting: *Deleted

## 2016-01-17 MED ORDER — GABAPENTIN 300 MG PO CAPS: 300.0000 mg | ORAL_CAPSULE | Freq: Two times a day (BID) | ORAL | 1 refills | Status: DC

## 2016-01-17 MED ORDER — PANTOPRAZOLE SODIUM 40 MG PO TBEC
40.0000 mg | DELAYED_RELEASE_TABLET | Freq: Two times a day (BID) | ORAL | 1 refills | Status: DC
Start: 2016-01-17 — End: 2017-02-06

## 2016-01-17 MED ORDER — PRAVASTATIN SODIUM 20 MG PO TABS: 20.0000 mg | ORAL_TABLET | Freq: Every day | ORAL | 1 refills | Status: DC

## 2016-01-17 MED ORDER — METOPROLOL TARTRATE 25 MG PO TABS: 25.0000 mg | ORAL_TABLET | Freq: Two times a day (BID) | ORAL | 1 refills | Status: DC

## 2016-01-17 MED ORDER — FLUOXETINE HCL 40 MG PO CAPS: 40.0000 mg | ORAL_CAPSULE | Freq: Two times a day (BID) | ORAL | 2 refills | Status: DC

## 2016-01-17 NOTE — Telephone Encounter (Signed)
Left msg on triage requesting 90 day supply to be sent to [facility] on all maintenance meds. Sent electronically to pharmacy on file...[name]

## 2016-02-05 DIAGNOSIS — J449 Chronic obstructive pulmonary disease, unspecified: Secondary | ICD-10-CM | POA: Diagnosis not present

## 2016-02-05 DIAGNOSIS — Z8639 Personal history of other endocrine, nutritional and metabolic disease: Secondary | ICD-10-CM | POA: Diagnosis not present

## 2016-02-05 DIAGNOSIS — M19271 Secondary osteoarthritis, right ankle and foot: Secondary | ICD-10-CM | POA: Diagnosis not present

## 2016-02-05 DIAGNOSIS — M19072 Primary osteoarthritis, left ankle and foot: Secondary | ICD-10-CM | POA: Diagnosis not present

## 2016-02-05 DIAGNOSIS — Z8719 Personal history of other diseases of the digestive system: Secondary | ICD-10-CM | POA: Diagnosis not present

## 2016-02-05 DIAGNOSIS — Z8679 Personal history of other diseases of the circulatory system: Secondary | ICD-10-CM | POA: Diagnosis not present

## 2016-02-05 DIAGNOSIS — I25110 Atherosclerotic heart disease of native coronary artery with unstable angina pectoris: Secondary | ICD-10-CM | POA: Diagnosis not present

## 2016-02-06 DIAGNOSIS — B070 Plantar wart: Secondary | ICD-10-CM | POA: Diagnosis not present

## 2016-02-06 DIAGNOSIS — M069 Rheumatoid arthritis, unspecified: Secondary | ICD-10-CM | POA: Diagnosis not present

## 2016-02-06 DIAGNOSIS — M7741 Metatarsalgia, right foot: Secondary | ICD-10-CM | POA: Diagnosis not present

## 2016-02-06 DIAGNOSIS — M19071 Primary osteoarthritis, right ankle and foot: Secondary | ICD-10-CM | POA: Diagnosis not present

## 2016-02-06 DIAGNOSIS — I10 Essential (primary) hypertension: Secondary | ICD-10-CM | POA: Diagnosis not present

## 2016-02-06 DIAGNOSIS — J449 Chronic obstructive pulmonary disease, unspecified: Secondary | ICD-10-CM | POA: Diagnosis not present

## 2016-02-06 DIAGNOSIS — I25110 Atherosclerotic heart disease of native coronary artery with unstable angina pectoris: Secondary | ICD-10-CM | POA: Diagnosis not present

## 2016-02-06 DIAGNOSIS — B079 Viral wart, unspecified: Secondary | ICD-10-CM | POA: Diagnosis not present

## 2016-02-06 DIAGNOSIS — M19072 Primary osteoarthritis, left ankle and foot: Secondary | ICD-10-CM | POA: Diagnosis not present

## 2016-02-07 DIAGNOSIS — M069 Rheumatoid arthritis, unspecified: Secondary | ICD-10-CM | POA: Diagnosis not present

## 2016-02-07 DIAGNOSIS — I25110 Atherosclerotic heart disease of native coronary artery with unstable angina pectoris: Secondary | ICD-10-CM | POA: Diagnosis not present

## 2016-02-07 DIAGNOSIS — B079 Viral wart, unspecified: Secondary | ICD-10-CM | POA: Diagnosis not present

## 2016-02-07 DIAGNOSIS — I10 Essential (primary) hypertension: Secondary | ICD-10-CM | POA: Diagnosis not present

## 2016-02-07 DIAGNOSIS — M19071 Primary osteoarthritis, right ankle and foot: Secondary | ICD-10-CM | POA: Diagnosis not present

## 2016-02-07 DIAGNOSIS — M19072 Primary osteoarthritis, left ankle and foot: Secondary | ICD-10-CM | POA: Diagnosis not present

## 2016-02-26 DIAGNOSIS — M19071 Primary osteoarthritis, right ankle and foot: Secondary | ICD-10-CM | POA: Diagnosis not present

## 2016-02-27 ENCOUNTER — Ambulatory Visit (INDEPENDENT_AMBULATORY_CARE_PROVIDER_SITE_OTHER): Payer: Medicare Other | Admitting: Internal Medicine

## 2016-02-27 ENCOUNTER — Encounter: Payer: Self-pay | Admitting: Internal Medicine

## 2016-02-27 DIAGNOSIS — I2510 Atherosclerotic heart disease of native coronary artery without angina pectoris: Secondary | ICD-10-CM

## 2016-02-27 DIAGNOSIS — I10 Essential (primary) hypertension: Secondary | ICD-10-CM | POA: Diagnosis not present

## 2016-02-27 DIAGNOSIS — G894 Chronic pain syndrome: Secondary | ICD-10-CM

## 2016-02-27 MED ORDER — HYDROMORPHONE HCL 2 MG PO TABS: 2.0000 mg | ORAL_TABLET | Freq: Three times a day (TID) | ORAL | 0 refills | Status: DC | PRN

## 2016-02-27 MED ORDER — ALPRAZOLAM 0.5 MG PO TABS: 0.5000 mg | ORAL_TABLET | Freq: Two times a day (BID) | ORAL | 3 refills | Status: DC | PRN

## 2016-02-27 NOTE — Progress Notes (Signed)
Pre visit review using our clinic review tool, if applicable. No additional management support is needed unless otherwise documented below in the visit note. 

## 2016-02-27 NOTE — Assessment & Plan Note (Signed)
Plavix Lopressor

## 2016-02-27 NOTE — Assessment & Plan Note (Signed)
Dilaudid prn  Potential benefits of a long term opioids use as well as potential risks (i.e. addiction risk, apnea etc) and complications (i.e. Somnolence, constipation and others) were explained to the patient and were aknowledged.

## 2016-02-27 NOTE — Assessment & Plan Note (Signed)
Lopressor, Lopressor

## 2016-02-27 NOTE — Progress Notes (Signed)
Subjective:  Patient ID: [name], male    DOB: 1945/08/20  Age: 70 y.o. MRN: [id number]  CC: No chief complaint [name] file.   HPI [name] presents for anxiety, BPH, GERD and chronic pain f/u  Outpatient Medications Prior to Visit  Medication Sig Dispense Refill  . albuterol (PROVENTIL HFA;VENTOLIN HFA) 108 (90 Base) MCG/ACT inhaler Inhale 1-2 puffs into the lungs every 4 (four) hours as needed for wheezing or shortness of breath.    . alfuzosin (UROXATRAL) 10 MG 24 hr tablet Take 10 mg by mouth daily.  0  . ALPRAZolam (XANAX) 0.5 MG tablet TAKE 1 TABLET BY MOUTH TWICE DAILY AS NEEDED FOR ANXIETY 60 tablet 1  . Cholecalciferol (VITAMIN D3) 2000 UNITS capsule Take 1 capsule (2,000 Units total) by mouth daily. 100 capsule 3  . clopidogrel (PLAVIX) 75 MG tablet TAKE 1 TABLET BY MOUTH EVERY DAY WITH BREAKFAST 90 tablet 1  . COLCRYS 0.6 MG tablet Take 1 tablet by mouth 2 (two) times daily.  2  . FLUoxetine (PROZAC) 40 MG capsule Take 1 capsule (40 mg total) by mouth 2 (two) times daily. 180 capsule 2  . fluticasone (FLONASE) 50 MCG/ACT nasal spray Place 2 sprays into both nostrils daily. 16 g 4  . gabapentin (NEURONTIN) 300 MG capsule Take 1-2 capsules (300-600 mg total) by mouth 2 (two) times daily. Take 300 mg in the morning and 632m in the evening 180 capsule 1  . HYDROmorphone (DILAUDID) 2 MG tablet Take 1 tablet (2 mg total) by mouth 3 (three) times daily as needed for severe pain. 90 tablet 0  . losartan-hydrochlorothiazide (HYZAAR) 50-12.5 MG tablet TAKE 1 TABLET BY MOUTH DAILY 90 tablet 2  . Melatonin 5 MG TABS Take 5 mg by mouth at bedtime.    . metoprolol tartrate (LOPRESSOR) 25 MG tablet Take 1 tablet (25 mg total) by mouth 2 (two) times daily. 180 tablet 1  . Omega-3 Fatty Acids (FISH OIL OMEGA-3) 1000 MG CAPS Take 1,000-2,000 mg by mouth 2 (two) times daily. Take 2000 mg in the morning and 1000 mg at night    . ondansetron (ZOFRAN) 4 MG tablet Take 1 tablet (4 mg  total) by mouth every 8 (eight) hours as needed for nausea or vomiting. 20 tablet 1  . pantoprazole (PROTONIX) 40 MG tablet Take 1 tablet (40 mg total) by mouth 2 (two) times daily. 180 tablet 1  . pravastatin (PRAVACHOL) 20 MG tablet Take 1 tablet (20 mg total) by mouth daily. 90 tablet 1  . predniSONE (DELTASONE) 5 MG tablet Take 190mby mouth daily  3   No facility-administered medications prior to visit.     ROS Review of Systems  Constitutional: Negative for appetite change, fatigue and unexpected weight change.  HENT: Negative for congestion, nosebleeds, sneezing, sore throat and trouble swallowing.   Eyes: Negative for itching and visual disturbance.  Respiratory: Negative for cough.   Cardiovascular: Negative for chest pain, palpitations and leg swelling.  Gastrointestinal: Negative for abdominal distention, blood in stool, diarrhea and nausea.  Genitourinary: Negative for frequency and hematuria.  Musculoskeletal: Positive for arthralgias and gait problem. Negative for back pain, joint swelling and neck pain.  Skin: Negative for rash.  Neurological: Negative for dizziness, tremors, speech difficulty and weakness.  Psychiatric/Behavioral: Negative for agitation, dysphoric mood, sleep disturbance and suicidal ideas. The patient is not nervous/anxious.     Objective:  BP 108/60   Pulse 66   Temp 97.9 F (36.6 C) (  Oral)   Wt 156 lb (70.8 kg)   SpO2 98%   BMI 25.18 kg/m   BP Readings from Last 3 Encounters:  02/27/16 108/60  01/08/16 (!) 106/50  12/25/15 (!) 100/58    Wt Readings from Last 3 Encounters:  02/27/16 156 lb (70.8 kg)  01/08/16 161 lb 12.8 oz (73.4 kg)  12/25/15 158 lb (71.7 kg)    Physical Exam  Constitutional: He is oriented to person, place, and time. He appears well-developed. No distress.  NAD  HENT:  Mouth/Throat: Oropharynx is clear and moist.  Eyes: Conjunctivae are normal. Pupils are equal, round, and reactive to light.  Neck: Normal range  of motion. No JVD present. No thyromegaly present.  Cardiovascular: Normal rate, regular rhythm, normal heart sounds and intact distal pulses.  Exam reveals no gallop and no friction rub.   No murmur heard. Pulmonary/Chest: Effort normal and breath sounds normal. No respiratory distress. He has no wheezes. He has no rales. He exhibits no tenderness.  Abdominal: Soft. Bowel sounds are normal. He exhibits no distension and no mass. There is no tenderness. There is no rebound and no guarding.  Musculoskeletal: Normal range of motion. He exhibits tenderness. He exhibits no edema.  Lymphadenopathy:    He has no cervical adenopathy.  Neurological: He is alert and oriented to person, place, and time. He has normal reflexes. No cranial nerve deficit. He exhibits normal muscle tone. He displays a negative Romberg sign. Coordination and gait normal.  Skin: Skin is warm and dry. No rash noted.  Psychiatric: He has a normal mood and affect. His behavior is normal. Judgment and thought content normal.  L leg in the cast, R in a surgical boot A knee scooter  Lab Results  Component Value Date   WBC 6.6 09/28/2015   HGB 9.6 (L) 09/28/2015   HCT 28.4 (L) 09/28/2015   PLT 324.0 09/28/2015   GLUCOSE 107 (H) 09/28/2015   CHOL 128 03/10/2015   TRIG 95.0 03/10/2015   HDL 39.30 03/10/2015   LDLCALC 70 03/10/2015   ALT 15 03/10/2015   AST 16 03/10/2015   NA 131 (L) 09/28/2015   K 3.8 09/28/2015   CL 94 (L) 09/28/2015   CREATININE 0.85 09/28/2015   BUN 17 09/28/2015   CO2 33 (H) 09/28/2015   TSH 1.10 09/28/2015   PSA 1.95 10/15/2010   INR 1.1 (H) 06/14/2014   HGBA1C 6.0 09/28/2015    Ct Chest W Contrast  Result Date: 10/13/2015 CLINICAL DATA:  Elevated hemidiaphragm [name] chest radiography. EXAM: CT CHEST WITH CONTRAST TECHNIQUE: Multidetector CT imaging of the chest was performed during intravenous contrast administration. CONTRAST:  34m ISOVUE-300 IOPAMIDOL (ISOVUE-300) INJECTION 61% COMPARISON:   03/28/2015 and multiple previous FINDINGS: Mediastinum/Lymph Nodes: No enlarged hilar or mediastinal lymph nodes. Aortic atherosclerosis. No aneurysm or dissection. Coronary artery atherosclerosis. No pericardial fluid. Chronically elevated right hemidiaphragm. Lungs/Pleura: No pleural fluid. Mild chronic volume loss at the right base secondary to the elevated hemidiaphragm. The lungs are otherwise clear. Upper abdomen: Unremarkable except for benign appearing cysts of the right kidney. 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 Lower thoracic spinal fusion. IMPRESSION: Chronic elevation of the right hemidiaphragm. Chronic volume loss at the right lung base. No pleural or pericardial fluid. Aortic atherosclerosis.  Coronary artery atherosclerosis. Electronically Signed   By: MNelson ChimesM.D.   [name]: 10/13/2015 16:13    Assessment & Plan:   There are no diagnoses linked to this encounter. I am having  [name] maintain his ondansetron, Vitamin D3, fluticasone, albuterol, clopidogrel, predniSONE, FISH OIL OMEGA-3, alfuzosin, Melatonin, COLCRYS, losartan-hydrochlorothiazide, ALPRAZolam, HYDROmorphone, FLUoxetine, gabapentin, metoprolol tartrate, pantoprazole, and pravastatin.  No orders of the defined types were placed in this encounter.    Follow-up: No Follow-up [name] file.  [name], MD

## 2016-03-05 ENCOUNTER — Other Ambulatory Visit: Payer: Self-pay | Admitting: Internal Medicine

## 2016-03-18 DIAGNOSIS — M79671 Pain in right foot: Secondary | ICD-10-CM | POA: Diagnosis not present

## 2016-03-18 DIAGNOSIS — M79672 Pain in left foot: Secondary | ICD-10-CM | POA: Diagnosis not present

## 2016-03-21 DIAGNOSIS — N401 Enlarged prostate with lower urinary tract symptoms: Secondary | ICD-10-CM | POA: Diagnosis not present

## 2016-03-28 DIAGNOSIS — N401 Enlarged prostate with lower urinary tract symptoms: Secondary | ICD-10-CM | POA: Diagnosis not present

## 2016-03-28 DIAGNOSIS — N5201 Erectile dysfunction due to arterial insufficiency: Secondary | ICD-10-CM | POA: Diagnosis not present

## 2016-03-28 DIAGNOSIS — R3912 Poor urinary stream: Secondary | ICD-10-CM | POA: Diagnosis not present

## 2016-03-29 ENCOUNTER — Other Ambulatory Visit: Payer: Self-pay | Admitting: Internal Medicine

## 2016-04-12 ENCOUNTER — Other Ambulatory Visit: Payer: Self-pay | Admitting: *Deleted

## 2016-04-12 MED ORDER — GABAPENTIN 300 MG PO CAPS: 300.0000 mg | ORAL_CAPSULE | Freq: Two times a day (BID) | ORAL | 1 refills | Status: DC

## 2016-04-15 DIAGNOSIS — M150 Primary generalized (osteo)arthritis: Secondary | ICD-10-CM | POA: Diagnosis not present

## 2016-04-15 DIAGNOSIS — R0600 Dyspnea, unspecified: Secondary | ICD-10-CM | POA: Diagnosis not present

## 2016-04-15 DIAGNOSIS — Z6824 Body mass index (BMI) 24.0-24.9, adult: Secondary | ICD-10-CM | POA: Diagnosis not present

## 2016-04-15 DIAGNOSIS — M1120 Other chondrocalcinosis, unspecified site: Secondary | ICD-10-CM | POA: Diagnosis not present

## 2016-04-15 DIAGNOSIS — M5136 Other intervertebral disc degeneration, lumbar region: Secondary | ICD-10-CM | POA: Diagnosis not present

## 2016-04-15 DIAGNOSIS — D6489 Other specified anemias: Secondary | ICD-10-CM | POA: Diagnosis not present

## 2016-04-15 DIAGNOSIS — M0579 Rheumatoid arthritis with rheumatoid factor of multiple sites without organ or systems involvement: Secondary | ICD-10-CM | POA: Diagnosis not present

## 2016-04-15 DIAGNOSIS — M25531 Pain in right wrist: Secondary | ICD-10-CM | POA: Diagnosis not present

## 2016-04-25 DIAGNOSIS — Z0289 Encounter for other administrative examinations: Secondary | ICD-10-CM

## 2016-05-01 ENCOUNTER — Telehealth: Payer: Self-pay | Admitting: Emergency Medicine

## 2016-05-01 NOTE — Telephone Encounter (Signed)
I called pt- he c/o upper back/neck pain. He is hoping you can work him in this week as he is in severe pain.  He did not sleep at all last night and he has taken more dilaudid with little relief. Please advise.

## 2016-05-01 NOTE — Telephone Encounter (Signed)
Pt called about needed to get a pain injection this week. Can you please give him a call back. Thanks.

## 2016-05-02 NOTE — Telephone Encounter (Signed)
pls w/in w/another provider Thx

## 2016-05-02 NOTE — Telephone Encounter (Signed)
Jamie to call pt and schedule OV.

## 2016-05-06 DIAGNOSIS — M79672 Pain in left foot: Secondary | ICD-10-CM | POA: Diagnosis not present

## 2016-05-06 DIAGNOSIS — Z981 Arthrodesis status: Secondary | ICD-10-CM | POA: Diagnosis not present

## 2016-05-06 DIAGNOSIS — M19071 Primary osteoarthritis, right ankle and foot: Secondary | ICD-10-CM | POA: Diagnosis not present

## 2016-05-09 DIAGNOSIS — M542 Cervicalgia: Secondary | ICD-10-CM | POA: Diagnosis not present

## 2016-05-09 DIAGNOSIS — M47812 Spondylosis without myelopathy or radiculopathy, cervical region: Secondary | ICD-10-CM | POA: Diagnosis not present

## 2016-05-16 DIAGNOSIS — M47812 Spondylosis without myelopathy or radiculopathy, cervical region: Secondary | ICD-10-CM | POA: Diagnosis not present

## 2016-05-23 DIAGNOSIS — I10 Essential (primary) hypertension: Secondary | ICD-10-CM | POA: Diagnosis not present

## 2016-05-23 DIAGNOSIS — M47812 Spondylosis without myelopathy or radiculopathy, cervical region: Secondary | ICD-10-CM | POA: Diagnosis not present

## 2016-05-31 DIAGNOSIS — M47812 Spondylosis without myelopathy or radiculopathy, cervical region: Secondary | ICD-10-CM | POA: Diagnosis not present

## 2016-06-03 ENCOUNTER — Telehealth: Payer: Self-pay | Admitting: Internal Medicine

## 2016-06-03 NOTE — Telephone Encounter (Signed)
Ok to use these slots Thx

## 2016-06-03 NOTE — Telephone Encounter (Signed)
Routing to dr plotnikov---you have a few "new patient" and "same day" slots available this week---do you want patient worked in on one of those---or what else would you like to do---please advise, I will call patient back, thanks

## 2016-06-03 NOTE — Telephone Encounter (Signed)
Made appt with dr plotnikov for 2/27 at 1:15

## 2016-06-03 NOTE — Telephone Encounter (Signed)
Patient states he has been seeing Dr. Maryjean Ka w/ neurosurgery.  He has been giving patient steroid injections to find the spot to cut out nerve root.  Patient states he is in a lot of pain and also wants Dr. Judeen Hammans opinion of having nerve root cut out.  Patient would like to be worked in this week if possible.

## 2016-06-04 ENCOUNTER — Encounter (HOSPITAL_COMMUNITY): Payer: Self-pay | Admitting: *Deleted

## 2016-06-04 ENCOUNTER — Emergency Department (HOSPITAL_COMMUNITY)
Admission: EM | Admit: 2016-06-04 | Discharge: 2016-06-04 | Disposition: A | Discharge status: Home / Self Care | Payer: Medicare Other | Attending: Emergency Medicine | Admitting: Emergency Medicine

## 2016-06-04 ENCOUNTER — Encounter: Payer: Self-pay | Admitting: Internal Medicine

## 2016-06-04 ENCOUNTER — Emergency Department (HOSPITAL_COMMUNITY): Payer: Medicare Other

## 2016-06-04 ENCOUNTER — Ambulatory Visit (INDEPENDENT_AMBULATORY_CARE_PROVIDER_SITE_OTHER): Payer: Medicare Other | Admitting: Internal Medicine

## 2016-06-04 DIAGNOSIS — D500 Iron deficiency anemia secondary to blood loss (chronic): Secondary | ICD-10-CM

## 2016-06-04 DIAGNOSIS — Y999 Unspecified external cause status: Secondary | ICD-10-CM | POA: Diagnosis not present

## 2016-06-04 DIAGNOSIS — Z79899 Other long term (current) drug therapy: Secondary | ICD-10-CM | POA: Insufficient documentation

## 2016-06-04 DIAGNOSIS — W01190A Fall on same level from slipping, tripping and stumbling with subsequent striking against furniture, initial encounter: Secondary | ICD-10-CM | POA: Insufficient documentation

## 2016-06-04 DIAGNOSIS — Y929 Unspecified place or not applicable: Secondary | ICD-10-CM | POA: Diagnosis not present

## 2016-06-04 DIAGNOSIS — I10 Essential (primary) hypertension: Secondary | ICD-10-CM | POA: Insufficient documentation

## 2016-06-04 DIAGNOSIS — S098XXA Other specified injuries of head, initial encounter: Secondary | ICD-10-CM | POA: Diagnosis not present

## 2016-06-04 DIAGNOSIS — J449 Chronic obstructive pulmonary disease, unspecified: Secondary | ICD-10-CM | POA: Diagnosis not present

## 2016-06-04 DIAGNOSIS — I252 Old myocardial infarction: Secondary | ICD-10-CM | POA: Insufficient documentation

## 2016-06-04 DIAGNOSIS — S0100XA Unspecified open wound of scalp, initial encounter: Secondary | ICD-10-CM | POA: Diagnosis not present

## 2016-06-04 DIAGNOSIS — Y939 Activity, unspecified: Secondary | ICD-10-CM | POA: Diagnosis not present

## 2016-06-04 DIAGNOSIS — S0101XD Laceration without foreign body of scalp, subsequent encounter: Secondary | ICD-10-CM

## 2016-06-04 DIAGNOSIS — S0101XA Laceration without foreign body of scalp, initial encounter: Secondary | ICD-10-CM | POA: Insufficient documentation

## 2016-06-04 DIAGNOSIS — I2510 Atherosclerotic heart disease of native coronary artery without angina pectoris: Secondary | ICD-10-CM | POA: Diagnosis not present

## 2016-06-04 DIAGNOSIS — S0990XA Unspecified injury of head, initial encounter: Secondary | ICD-10-CM | POA: Diagnosis present

## 2016-06-04 DIAGNOSIS — R51 Headache: Secondary | ICD-10-CM | POA: Diagnosis not present

## 2016-06-04 DIAGNOSIS — M542 Cervicalgia: Secondary | ICD-10-CM | POA: Diagnosis not present

## 2016-06-04 DIAGNOSIS — G894 Chronic pain syndrome: Secondary | ICD-10-CM | POA: Diagnosis not present

## 2016-06-04 HISTORY — DX: Cervicalgia: M54.2

## 2016-06-04 HISTORY — DX: Other chronic pain: G89.29

## 2016-06-04 MED ORDER — HYDROMORPHONE HCL 2 MG PO TABS: 2.0000 mg | ORAL_TABLET | Freq: Two times a day (BID) | ORAL | 0 refills | Status: DC | PRN

## 2016-06-04 MED ORDER — HYDROCODONE-ACETAMINOPHEN 5-325 MG PO TABS
2.0000 | ORAL_TABLET | Freq: Once | ORAL | Status: AC
  Administered 2016-06-04: 2 via ORAL
  Filled 2016-06-04: qty 2

## 2016-06-04 MED ORDER — HYDROGEN PEROXIDE 3 % EX SOLN
CUTANEOUS | Status: AC
  Filled 2016-06-04: qty 473

## 2016-06-04 MED ORDER — GABAPENTIN 600 MG PO TABS: 600.0000 mg | ORAL_TABLET | Freq: Three times a day (TID) | ORAL | 5 refills | Status: DC

## 2016-06-04 MED ORDER — LIDOCAINE HCL (PF) 2 % IJ SOLN
10.0000 mL | Freq: Once | INTRAMUSCULAR | Status: AC
  Administered 2016-06-04: 10 mL
  Filled 2016-06-04: qty 10

## 2016-06-04 MED ORDER — PROMETHAZINE HCL 12.5 MG PO TABS
12.5000 mg | ORAL_TABLET | Freq: Once | ORAL | Status: AC
  Administered 2016-06-04: 12.5 mg via ORAL
  Filled 2016-06-04: qty 1

## 2016-06-04 NOTE — Progress Notes (Signed)
Pre-visit discussion using our clinic review tool. No additional management support is needed unless otherwise documented below in the visit note.  

## 2016-06-04 NOTE — Assessment & Plan Note (Signed)
Neck pain is worse Dilaudid prn - dose increased w/caution RTC 1 mo  Potential benefits of a long term opioids use as well as potential risks (i.e. addiction risk, apnea etc) and complications (i.e. Somnolence, constipation and others) were explained to the patient and were aknowledged.

## 2016-06-04 NOTE — Assessment & Plan Note (Signed)
RTC 5-7 d Discussed SDH/concussion sx's to watch for

## 2016-06-04 NOTE — Discharge Instructions (Signed)
Take your usual prescriptions as previously directed.  Wash the area with soap and water at least twice a day.  If the area becomes soiled, wash the area with soap and water.  Call your regular medical doctor today to schedule a follow up appointment for a recheck within the next 48 hours and a suture removal visit in the next 7 to 10 days.  Return to the Emergency Department immediately if worsening.

## 2016-06-04 NOTE — Assessment & Plan Note (Signed)
Monitor CBC

## 2016-06-04 NOTE — ED Triage Notes (Addendum)
Pt slipped on a pile of magazines, causing him to fall back and hit his left head on a chair. Pt is on Plavix. Pt is alert and oriented. Denies any loss of consciousness.   Pt has neck pain that is chronic. States pain is worse than normal but this increase in pain started before the fall.

## 2016-06-04 NOTE — ED Provider Notes (Signed)
LACERATION REPAIR LEFT SCALP  Patient is a 71 year old male who sustained a fall while getting out of a chair and sustained a laceration to the left occipital area of the scalp.  I explained the laceration repair to the patient in terms which he understands. We also discussed the need for repair since he is on Plavix. Questions were answered. The patient gives permission to proceed with the procedure.  The patient was identified by arm band. Procedural time out was taken. The area was first cleansed with hydrogen peroxide, as there was dried blood in the field on. Following this the area was painted with Betadine. The wound area was infiltrated with 2% plain lidocaine. After the area was thoroughly anesthetized. The wound was irrigated and cleansed again with Betadine. The patient was draped in the usual sterile fashion. The laceration was repaired with 6 staples. The laceration measures 4.2 cm. Patient tolerated the procedure without problem.   Lily Kocher, PA-C 06/04/16 [place], DO 06/09/16 1723

## 2016-06-04 NOTE — Progress Notes (Signed)
Subjective:  Patient ID: [name], male    DOB: Aug 15, 1945  Age: 71 y.o. MRN: [id number]  CC: Neck Pain (constant pain, had trigger point injects with Neurologist, 3 weeks, ongoing) and Head Injury (back of head, went to ER 06/04/2016)   HPI [name] presents for sever pain in the neck C6-7 vertebrae related (Dr Maryjean Ka) - just had an injection. Pain is back after 2 days The pt fell this am (slipped on a pile of magazines) - laceration ov the occip area repaired (ER) w/staples. He bled a lot. CT head/neck - no fx. No LOC  Outpatient Medications Prior to Visit  Medication Sig Dispense Refill  . albuterol (PROVENTIL HFA;VENTOLIN HFA) 108 (90 Base) MCG/ACT inhaler Inhale 1-2 puffs into the lungs every 4 (four) hours as needed for wheezing or shortness of breath.    . alfuzosin (UROXATRAL) 10 MG 24 hr tablet Take 10 mg by mouth daily.  0  . ALPRAZolam (XANAX) 0.5 MG tablet Take 1 tablet (0.5 mg total) by mouth 2 (two) times daily as needed. for anxiety 60 tablet 3  . Cholecalciferol (VITAMIN D3) 2000 UNITS capsule Take 1 capsule (2,000 Units total) by mouth daily. 100 capsule 3  . clopidogrel (PLAVIX) 75 MG tablet TAKE 1 TABLET BY MOUTH EVERY DAY WITH BREAKFAST 90 tablet 3  . COLCRYS 0.6 MG tablet Take 1 tablet by mouth 2 (two) times daily.  2  . FLUoxetine (PROZAC) 40 MG capsule Take 1 capsule (40 mg total) by mouth 2 (two) times daily. 180 capsule 2  . fluticasone (FLONASE) 50 MCG/ACT nasal spray INSTILL 2 SPRAYS IN EACH NOSTRIL EVERY DAY 16 g 11  . gabapentin (NEURONTIN) 300 MG capsule Take 1-2 capsules (300-600 mg total) by mouth 2 (two) times daily. Take 300 mg in the morning and 633m in the evening 150 capsule 1  . HYDROmorphone (DILAUDID) 2 MG tablet Take 1 tablet (2 mg total) by mouth 3 (three) times daily as needed for severe pain. Please fill on or after 04/28/16 90 tablet 0  . losartan-hydrochlorothiazide (HYZAAR) 50-12.5 MG tablet TAKE 1 TABLET BY MOUTH DAILY 90  tablet 2  . Melatonin 5 MG TABS Take 5 mg by mouth at bedtime.    . metoprolol tartrate (LOPRESSOR) 25 MG tablet Take 1 tablet (25 mg total) by mouth 2 (two) times daily. 180 tablet 1  . Omega-3 Fatty Acids (FISH OIL OMEGA-3) 1000 MG CAPS Take 1,000-2,000 mg by mouth 2 (two) times daily. Take 2000 mg in the morning and 1000 mg at night    . ondansetron (ZOFRAN) 4 MG tablet Take 1 tablet (4 mg total) by mouth every 8 (eight) hours as needed for nausea or vomiting. 20 tablet 1  . pantoprazole (PROTONIX) 40 MG tablet Take 1 tablet (40 mg total) by mouth 2 (two) times daily. 180 tablet 1  . pravastatin (PRAVACHOL) 20 MG tablet Take 1 tablet (20 mg total) by mouth daily. 90 tablet 1  . predniSONE (DELTASONE) 5 MG tablet take 564mdaily  3   No facility-administered medications prior to visit.     ROS Review of Systems  Constitutional: Positive for fatigue. Negative for appetite change and unexpected weight change.  HENT: Negative for congestion, nosebleeds, sneezing, sore throat and trouble swallowing.   Eyes: Negative for itching and visual disturbance.  Respiratory: Negative for cough.   Cardiovascular: Negative for chest pain, palpitations and leg swelling.  Gastrointestinal: Negative for abdominal distention, blood in stool, diarrhea and nausea.  Genitourinary: Negative for frequency and hematuria.  Musculoskeletal: Positive for arthralgias, back pain, gait problem, neck pain and neck stiffness. Negative for joint swelling.  Skin: Negative for rash.  Neurological: Negative for dizziness, tremors, speech difficulty and weakness.  Psychiatric/Behavioral: Negative for agitation, dysphoric mood, sleep disturbance and suicidal ideas. The patient is nervous/anxious.     Objective:  BP 110/70   Pulse 80   Temp 98.2 F (36.8 C) (Oral)   Resp 16   Ht 5' 6"  (1.676 m)   Wt 151 lb (68.5 kg)   SpO2 98%   BMI 24.37 kg/m   BP Readings from Last 3 Encounters:  06/04/16 110/70  06/04/16 121/83    02/27/16 108/60    Wt Readings from Last 3 Encounters:  06/04/16 151 lb (68.5 kg)  06/04/16 155 lb (70.3 kg)  02/27/16 156 lb (70.8 kg)    Physical Exam  Constitutional: He is oriented to person, place, and time. He appears well-developed. No distress.  NAD  HENT:  Mouth/Throat: Oropharynx is clear and moist.  Eyes: Conjunctivae are normal. Pupils are equal, round, and reactive to light.  Neck: Normal range of motion. No JVD present. No thyromegaly present.  Cardiovascular: Normal rate, regular rhythm, normal heart sounds and intact distal pulses.  Exam reveals no gallop and no friction rub.   No murmur heard. Pulmonary/Chest: Effort normal and breath sounds normal. No respiratory distress. He has no wheezes. He has no rales. He exhibits no tenderness.  Abdominal: Soft. Bowel sounds are normal. He exhibits no distension and no mass. There is no tenderness. There is no rebound and no guarding.  Musculoskeletal: Normal range of motion. He exhibits tenderness. He exhibits no edema.  Lymphadenopathy:    He has no cervical adenopathy.  Neurological: He is alert and oriented to person, place, and time. He has normal reflexes. No cranial nerve deficit. He exhibits normal muscle tone. He displays a negative Romberg sign. Coordination abnormal. Gait normal.  Skin: Skin is warm and dry. No rash noted.  Psychiatric: He has a normal mood and affect. His behavior is normal. Judgment and thought content normal.  head wound is dressed Neck - tender w/ROM  Lab Results  Component Value Date   WBC 6.6 09/28/2015   HGB 9.6 (L) 09/28/2015   HCT 28.4 (L) 09/28/2015   PLT 324.0 09/28/2015   GLUCOSE 107 (H) 09/28/2015   CHOL 128 03/10/2015   TRIG 95.0 03/10/2015   HDL 39.30 03/10/2015   LDLCALC 70 03/10/2015   ALT 15 03/10/2015   AST 16 03/10/2015   NA 131 (L) 09/28/2015   K 3.8 09/28/2015   CL 94 (L) 09/28/2015   CREATININE 0.85 09/28/2015   BUN 17 09/28/2015   CO2 33 (H) 09/28/2015   TSH  1.10 09/28/2015   PSA 1.95 10/15/2010   INR 1.1 (H) 06/14/2014   HGBA1C 6.0 09/28/2015    Ct Head Wo Contrast  Result Date: 06/04/2016 CLINICAL DATA:  Pt slipped on a pile of magazines, causing him to fall back and hit his left head on a chair. Pt is on Plavix. Pt is alert and oriented; rt side neck pain started before the fall; EXAM: CT HEAD WITHOUT CONTRAST CT CERVICAL SPINE WITHOUT CONTRAST TECHNIQUE: Multidetector CT imaging of the head and cervical spine was performed following the standard protocol without intravenous contrast. Multiplanar CT image reconstructions of the cervical spine were also generated. COMPARISON:  10/30/2013 FINDINGS: CT HEAD FINDINGS Brain: No intracranial hemorrhage, mass effect or midline shift.  No acute cortical infarction. No mass lesion is noted on this unenhanced scan. Stable mild cerebral atrophy. Stable periventricular and patchy subcortical chronic white matter disease. Ventricular size is stable from prior exam. Vascular: Minimal atherosclerotic calcifications of carotid siphon. Skull: No skull fracture is noted. There is scalp swelling left posterior parietal region high convexity. Left scalp subcutaneous hematoma measures 3 cm length by 9 mm thickness Sinuses/Orbits: No paranasal sinuses air-fluid levels. Other: None CT CERVICAL SPINE FINDINGS Comparison exam 05/06/2013 Alignment: No change in alignment from prior exam. Again noted chronic anterolisthesis about 3.4 mm C3 on C4 vertebral body. Stable chronic anterolisthesis about 3 mm C4 on C5 vertebral body. Stable about 4 mm anterolisthesis C7 on T1 vertebral body. Skull base and vertebrae: No acute fracture or subluxation. There is disc space flattening with mild anterior and mild posterior spurring at C2-C3 level. Stable mild disc space flattening at C3-C4 and C4-C5 level. Degenerative changes C1-C2 articulation. There is moderate disc space flattening with mild anterior and mild posterior spurring at C5-C6 and  C6-C7 level. Moderate disc space flattening at C7-T1 level. Soft tissues and spinal canal: No prevertebral soft tissue swelling. Spinal canal is patent. Cervical airway is patent. Disc levels: Disc space flattening at C2-C3 level, C3-C4 level, C5-C6, C6-C7 and C7-T1 level. Upper chest: There is no pneumothorax in visualized lung apices. Multilevel facet degenerative changes are noted. Other: None IMPRESSION: 1. No acute intracranial abnormality. Stable atrophy and chronic white matter disease. No definite acute cortical infarction. There is scalp swelling and small subcutaneous hematoma in left posterior parietal scalp region high convexity. Probable small skin laceration at this level. Clinical correlation is necessary. 2. No cervical spine acute fracture or subluxation. Again noted anterolisthesis at C3-C4, C4-C5 and C7-T1 level. Stable multilevel degenerative changes as described above. No prevertebral soft tissue swelling. Electronically Signed   By: Lahoma Crocker M.D.   On: 06/04/2016 09:17   Ct Cervical Spine Wo Contrast  Result Date: 06/04/2016 CLINICAL DATA:  Pt slipped on a pile of magazines, causing him to fall back and hit his left head on a chair. Pt is on Plavix. Pt is alert and oriented; rt side neck pain started before the fall; EXAM: CT HEAD WITHOUT CONTRAST CT CERVICAL SPINE WITHOUT CONTRAST TECHNIQUE: Multidetector CT imaging of the head and cervical spine was performed following the standard protocol without intravenous contrast. Multiplanar CT image reconstructions of the cervical spine were also generated. COMPARISON:  10/30/2013 FINDINGS: CT HEAD FINDINGS Brain: No intracranial hemorrhage, mass effect or midline shift. No acute cortical infarction. No mass lesion is noted on this unenhanced scan. Stable mild cerebral atrophy. Stable periventricular and patchy subcortical chronic white matter disease. Ventricular size is stable from prior exam. Vascular: Minimal atherosclerotic calcifications  of carotid siphon. Skull: No skull fracture is noted. There is scalp swelling left posterior parietal region high convexity. Left scalp subcutaneous hematoma measures 3 cm length by 9 mm thickness Sinuses/Orbits: No paranasal sinuses air-fluid levels. Other: None CT CERVICAL SPINE FINDINGS Comparison exam 05/06/2013 Alignment: No change in alignment from prior exam. Again noted chronic anterolisthesis about 3.4 mm C3 on C4 vertebral body. Stable chronic anterolisthesis about 3 mm C4 on C5 vertebral body. Stable about 4 mm anterolisthesis C7 on T1 vertebral body. Skull base and vertebrae: No acute fracture or subluxation. There is disc space flattening with mild anterior and mild posterior spurring at C2-C3 level. Stable mild disc space flattening at C3-C4 and C4-C5 level. Degenerative changes C1-C2 articulation. There is moderate  disc space flattening with mild anterior and mild posterior spurring at C5-C6 and C6-C7 level. Moderate disc space flattening at C7-T1 level. Soft tissues and spinal canal: No prevertebral soft tissue swelling. Spinal canal is patent. Cervical airway is patent. Disc levels: Disc space flattening at C2-C3 level, C3-C4 level, C5-C6, C6-C7 and C7-T1 level. Upper chest: There is no pneumothorax in visualized lung apices. Multilevel facet degenerative changes are noted. Other: None IMPRESSION: 1. No acute intracranial abnormality. Stable atrophy and chronic white matter disease. No definite acute cortical infarction. There is scalp swelling and small subcutaneous hematoma in left posterior parietal scalp region high convexity. Probable small skin laceration at this level. Clinical correlation is necessary. 2. No cervical spine acute fracture or subluxation. Again noted anterolisthesis at C3-C4, C4-C5 and C7-T1 level. Stable multilevel degenerative changes as described above. No prevertebral soft tissue swelling. Electronically Signed   By: Lahoma Crocker M.D.   On: 06/04/2016 09:17    Assessment  & Plan:   There are no diagnoses linked to this encounter. I am having [name] maintain his ondansetron, Vitamin D3, albuterol, predniSONE, FISH OIL OMEGA-3, alfuzosin, Melatonin, COLCRYS, losartan-hydrochlorothiazide, FLUoxetine, metoprolol tartrate, pantoprazole, pravastatin, ALPRAZolam, HYDROmorphone, clopidogrel, fluticasone, and gabapentin.  No orders of the defined types were placed in this encounter.    Follow-up: No Follow-up on file.  [name], MD

## 2016-06-04 NOTE — ED Provider Notes (Signed)
[facility] DEPT Provider Note   CSN: [id number] Arrival date & time: 06/04/16  5916     History   Chief Complaint Chief Complaint  Patient presents with  . Head Injury    HPI [name] is a 71 y.o. male.   Head Injury      Pt was seen at 0750. Per pt, c/o sudden onset and resolution of one episode of slip and fall that occurred this morning approximately 0630 PTA. Pt states he slipped on a pile of magazines and fell backwards, hitting his head on a chair. Pt c/o head injury, scalp laceration, and acute flair of his chronic neck pain. Pt denies LOC, no AMS, no back pain, no prodromal symptoms before fall, no CP/SOB, no abd pain, no N/V/D, no focal motor weakness, no tingling/numbness in extremities.    Td UTD Past Medical History:  Diagnosis Date  . Alcoholism /alcohol abuse ([place])    per family  . Allergic rhinitis   . Anxiety   . Bacterial infection   . CAD (coronary artery disease)    minimal coronary plaque in the LAD and right coronary system. 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 7-06. Last nuclear stress 11-17-06 fixed anterior/ inferior defect, no inducible ischemia, EF 81%  . Chronic back pain    "all over back"  . Chronic neck pain   . COPD (chronic obstructive pulmonary disease) ([place])   . Depression   . Diverticulosis   . Dysrhythmia 01-24-12   past hx. A.Fib x1 episode-responded to med.  . Falls frequently    "since 02/2013" (06/16/2013)  . GERD (gastroesophageal reflux disease)   . Hemochromatosis    dx'd 14 yrs ago last ferritin Aug 11, 08 52 (22-322), Fe 136 ("I had 250 phlebotomies for that")  . High cholesterol    hx  . Hx of colonic polyps   . Hx of colonoscopy   . Hypertension   . Migraine    "for 3 months in 2015; related to infection in my back" (06/17/2014)  . Myocardial infarction 2006   "related to catheterization"  . Narcotic abuse    per family  .  Osteoarthritis   . PONV (postoperative nausea and vomiting)   . RA (rheumatoid arthritis) ([place])   . Sleep apnea    "never dx'd;" (06/17/2014))    Patient Active Problem List   Diagnosis Date Noted  . 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  . Pain in joint, ankle and foot 09/28/2015  . Anemia due to chronic blood loss 09/28/2015  . Hyponatremia 09/28/2015  . Cramp in limb 09/28/2015  . Exertional angina ([place]) 06/17/2014  . DOE (dyspnea on exertion) 04/28/2014  . Hypokalemia 03/11/2014  . Preventative health care 12/31/2013  . Impaired glucose tolerance 12/31/2013  . 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[place]) 11/18/2013  . Fever 11/18/2013  . Leukocytosis 10/30/2013  . Fatigue 10/22/2013  . Hoarseness of voice 08/10/2013  . Sleep disorder 07/05/2013  . Tension headache 07/01/2013  . Polypharmacy 06/19/2013  . Lumbar post-laminectomy syndrome 05/27/2013  . Degenerative arthritis of spine 05/26/2013  . Hemochromatosis 05/26/2013  . Wound infection after surgery 05/26/2013  . Normocytic anemia 05/26/2013  . Unintentional weight loss 05/26/2013  . Protein-calorie malnutrition, severe ([place]) 05/25/2013  . Chronic pain syndrome 05/23/2013  . COPD (chronic obstructive pulmonary disease) ([place]) 05/06/2013  . Frequent falls 05/06/2013  . Encounter for postoperative wound care 05/06/2013  . Atrial fibrillation ([place]) 08/07/2010  . Rheumatoid  arthritis ([place]) 10/18/2008  . Elevated lipids 03/30/2007  . PSEUDOGOUT 03/30/2007  . Essential hypertension 03/30/2007  . Coronary atherosclerosis 03/30/2007    Past Surgical History:  Procedure Laterality Date  . ABDOMINAL ADHESION SURGERY  ~ 1968  . ANKLE RECONSTRUCTION Right 6-09   [facility]  . APPENDECTOMY  ~ 1956  . BONE TUMOR RESECTION  ~ 1954   "taken off my mastoid"  . CARPAL TUNNEL RELEASE Right 1990's  . CATARACT EXTRACTION, BILATERAL Bilateral 01-24-12  . CORONARY ANGIOPLASTY WITH STENT PLACEMENT  2006   "while repairing 1st stent, a  second area tore and they had to place 2nd stent " ?LAD & CX  . CORONARY ANGIOPLASTY WITH STENT PLACEMENT  06/17/2014  . CYST EXCISION  "several OR's"   "backX 2, back of my neck, face, inside right bicept, chest, wrist"  . FOOT SURGERY Right 11-08   for removal of bone spurs-  . FRACTURE SURGERY    . HAMMER TOE SURGERY Right 07/2012   "broke 4 hammertoes"   . HARDWARE REMOVAL  03/09/2012   Procedure: HARDWARE REMOVAL;  Surgeon: Nita Sells, MD;  Location: [facility];  Service: Orthopedics;  Laterality: Left;  Hardware Removal from Left Shoulder  . HARVEST BONE GRAFT  02/06/2012   Procedure: HARVEST ILIAC BONE GRAFT;  Surgeon: Nita Sells, MD;  Location: [facility];  Service: Orthopedics;;  bone marrow aspirqation   . INGUINAL HERNIA REPAIR Bilateral   . JOINT REPLACEMENT    . KNEE SURGERY Left ~ 2003   "6-12 months after uni knee removed synovial sack"  . LEFT HEART CATHETERIZATION WITH CORONARY ANGIOGRAM N/A 06/17/2014   PCI of diffuse severe stenosis in the proximal to mid LAD using overlapping drug-eluting stents.  . LUMBAR WOUND DEBRIDEMENT N/A 03/17/2013   Procedure: Incision and drainage of superficial lumbar wound;  Surgeon: Floyce Stakes, MD;  Location: [facility];  Service: Neurosurgery;  Laterality: N/A;  Incision and drainage of superficial lumbar wound  . MECKEL DIVERTICULUM EXCISION  ~ 1956  . ORIF SHOULDER FRACTURE  02/06/2012   Procedure: OPEN REDUCTION INTERNAL FIXATION (ORIF) SHOULDER FRACTURE;  Surgeon: Nita Sells, MD;  Location: [facility];  Service: Orthopedics;  Laterality: Left;  ORIF of a Left Shoulder Fracture with  Iliac Crest Bone Graft aspiration   . POSTERIOR LUMBAR FUSION  12-10   L4-5 diskectomy w/ fusion, cage placement and rods; [facility]  . POSTERIOR LUMBAR FUSION 4 LEVEL N/A 03/02/2013   Procedure: Lumbar One to Sacral One Posterior lumbar interbody fusion;  Surgeon: Floyce Stakes, MD;  Location: [facility];  Service: Neurosurgery;  Laterality: N/A;  L1 to S1 Posterior lumbar interbody fusion  . REPLACEMENT UNICONDYLAR JOINT KNEE Left ~ 2003   "~ 6 months after total knee replaced"  . SHOULDER ARTHROSCOPY Left ~ 2004 X 2   "@ [facility]; left bone splinter in & had to clean it out"  . TOTAL ANKLE REPLACEMENT Right 2008   at [facility]  . TOTAL KNEE ARTHROPLASTY Bilateral 2002  . TOTAL SHOULDER REPLACEMENT Left 2006  . TOTAL SHOULDER REPLACEMENT Right ~ 2007   Dr. Marlou Sa       Home Medications    Prior to Admission medications   Medication Sig Start Date End Date Taking? Authorizing Provider  albuterol (PROVENTIL HFA;VENTOLIN HFA) 108 (90 Base) MCG/ACT inhaler Inhale 1-2 puffs into the lungs every 4 (four) hours as needed for wheezing or shortness of breath.    Historical Provider,  MD  alfuzosin (UROXATRAL) 10 MG 24 hr tablet Take 10 mg by mouth daily. 09/25/15   Historical Provider, MD  ALPRAZolam Duanne Moron) 0.5 MG tablet Take 1 tablet (0.5 mg total) by mouth 2 (two) times daily as needed. for anxiety 02/27/16   Cassandria Anger, MD  Cholecalciferol (VITAMIN D3) 2000 UNITS capsule Take 1 capsule (2,000 Units total) by mouth daily. 12/05/14   Aleksei Plotnikov V, MD  clopidogrel (PLAVIX) 75 MG tablet TAKE 1 TABLET BY MOUTH EVERY DAY WITH BREAKFAST 03/05/16   Aleksei Plotnikov V, MD  COLCRYS 0.6 MG tablet Take 1 tablet by mouth 2 (two) times daily. 10/06/15   Historical Provider, MD  FLUoxetine (PROZAC) 40 MG capsule Take 1 capsule (40 mg total) by mouth 2 (two) times daily. 01/17/16   Aleksei Plotnikov V, MD  fluticasone (FLONASE) 50 MCG/ACT nasal spray INSTILL 2 SPRAYS IN EACH NOSTRIL EVERY DAY 03/29/16   Aleksei Plotnikov V, MD  gabapentin (NEURONTIN) 300 MG capsule Take 1-2 capsules (300-600 mg total) by mouth 2 (two) times daily. Take 300 mg in the morning and 666m in the evening 04/12/16   ALew DawesV, MD  HYDROmorphone (DILAUDID) 2 MG tablet Take 1 tablet (2 mg total) by mouth 3 (three)  times daily as needed for severe pain. Please fill on or after 04/28/16 02/27/16   ACassandria Anger MD  losartan-hydrochlorothiazide (HYZAAR) 50-12.5 MG tablet TAKE 1 TABLET BY MOUTH DAILY 11/09/15   ALew DawesV, MD  Melatonin 5 MG TABS Take 5 mg by mouth at bedtime.    Historical Provider, MD  metoprolol tartrate (LOPRESSOR) 25 MG tablet Take 1 tablet (25 mg total) by mouth 2 (two) times daily. 01/17/16   Aleksei Plotnikov V, MD  Omega-3 Fatty Acids (FISH OIL OMEGA-3) 1000 MG CAPS Take 1,000-2,000 mg by mouth 2 (two) times daily. Take 2000 mg in the morning and 1000 mg at night    Historical Provider, MD  ondansetron (ZOFRAN) 4 MG tablet Take 1 tablet (4 mg total) by mouth every 8 (eight) hours as needed for nausea or vomiting. 11/25/13   ACassandria Anger MD  pantoprazole (PROTONIX) 40 MG tablet Take 1 tablet (40 mg total) by mouth 2 (two) times daily. 01/17/16   Aleksei Plotnikov V, MD  pravastatin (PRAVACHOL) 20 MG tablet Take 1 tablet (20 mg total) by mouth daily. 01/17/16   Aleksei Plotnikov V, MD  predniSONE (DELTASONE) 5 MG tablet Take 192mby mouth daily 08/02/15   Historical Provider, MD    Family History Family History  Problem Relation Age of Onset  . Uterine cancer Mother     survivor  . Macular degeneration Mother   . Other Mother     ankle edema  . Lung cancer Mother   . Cancer Mother     ovarian, lung  . Coronary artery disease Father   . Hypertension Father   . Prostate cancer Father   . Colon polyps Father   . Cancer Father     prostate  . Heart attack Brother   . Hyperlipidemia Brother   . Other Brother     Schizophrenic  . Thyroid disease Sister   . Coronary artery disease Maternal Aunt   . Heart attack Maternal Aunt   . Rheum arthritis Sister   . Hemochromatosis Sister   . Diabetes Neg Hx   . Colon cancer Neg Hx   . Esophageal cancer Neg Hx   . Rectal cancer Neg Hx   . Stomach cancer  Neg Hx     Social History Social History  Substance Use  Topics  . Smoking status: Never Smoker  . Smokeless tobacco: Never Used  . Alcohol use No     Comment: "I do not drink anymore"     Allergies   Cefepime; Morphine and related; Penicillins; and Percocet [oxycodone-acetaminophen]   Review of Systems Review of Systems ROS: Statement: All systems negative except as marked or noted in the HPI; Constitutional: Negative for fever and chills. ; ; Eyes: Negative for eye pain, redness and discharge. ; ; ENMT: Negative for ear pain, hoarseness, nasal congestion, sinus pressure and sore throat. ; ; Cardiovascular: Negative for chest pain, palpitations, diaphoresis, dyspnea and peripheral edema. ; ; Respiratory: Negative for cough, wheezing and stridor. ; ; Gastrointestinal: Negative for nausea, vomiting, diarrhea, abdominal pain, blood in stool, hematemesis, jaundice and rectal bleeding. . ; ; Genitourinary: Negative for dysuria, flank pain and hematuria. ; ; Musculoskeletal: +head injury, chronic neck pain. Negative for back pain. Negative for swelling and deformity; ; Skin: +scalp laceration. Negative for pruritus, rash, abrasions, blisters, bruising and skin lesion.; ; Neuro: Negative for headache, lightheadedness and neck stiffness. Negative for weakness, altered level of consciousness, altered mental status, extremity weakness, paresthesias, involuntary movement, seizure and syncope.       Physical Exam Updated Vital Signs BP 104/69 (BP Location: Left Arm)   Pulse 73   Temp 98.4 F (36.9 C) (Oral)   Resp 18   Ht 5' 6"  (1.676 m)   Wt 155 lb (70.3 kg)   SpO2 98%   BMI 25.02 kg/m   Physical Exam 0755: Physical examination:  Nursing notes reviewed; Vital signs and O2 SAT reviewed;  Constitutional: Well developed, Well nourished, Well hydrated, In no acute distress; Head:  Normocephalic, +smal left posterior parietal scalp lac.; Eyes: EOMI, PERRL, No scleral icterus; ENMT: Mouth and pharynx normal, Mucous membranes moist; Neck: Supple, Full  range of motion, No lymphadenopathy; Cardiovascular: Regular rate and rhythm, No gallop; Respiratory: Breath sounds clear & equal bilaterally, No wheezes.  Speaking full sentences with ease, Normal respiratory effort/excursion; Chest: Nontender, Movement normal; Abdomen: Soft, Nontender, Nondistended, Normal bowel sounds; Genitourinary: No CVA tenderness; Spine:  No midline CS, TS, LS tenderness. +mild TTP bilat cervical paraspinal muscles. No rash, no ecchymosis.;; Extremities: Pulses normal, No tenderness, No edema, No calf edema or asymmetry.; Neuro: AA&Ox3, Major CN grossly intact.  Speech clear. No gross focal motor or sensory deficits in extremities.; Skin: Color normal, Warm, Dry.   ED Treatments / Results  Labs (all labs ordered are listed, but only abnormal results are displayed)   EKG  EKG Interpretation None       Radiology   Procedures Procedures (including critical care time)  Medications Ordered in ED Medications  hydrogen peroxide 3 % external solution (not administered)     Initial Impression / Assessment and Plan / ED Course  I have reviewed the triage vital signs and the nursing notes.  Pertinent labs & imaging results that were available during my care of the patient were reviewed by me and considered in my medical decision making (see chart for details).  MDM Reviewed: previous chart, nursing note and vitals Interpretation: CT scan   Ct Head Wo Contrast Result Date: 06/04/2016 CLINICAL DATA:  Pt slipped on a pile of magazines, causing him to fall back and hit his left head on a chair. Pt is on Plavix. Pt is alert and oriented; rt side neck pain started before the fall;  EXAM: CT HEAD WITHOUT CONTRAST CT CERVICAL SPINE WITHOUT CONTRAST TECHNIQUE: Multidetector CT imaging of the head and cervical spine was performed following the standard protocol without intravenous contrast. Multiplanar CT image reconstructions of the cervical spine were also generated.  COMPARISON:  10/30/2013 FINDINGS: CT HEAD FINDINGS Brain: No intracranial hemorrhage, mass effect or midline shift. No acute cortical infarction. No mass lesion is noted on this unenhanced scan. Stable mild cerebral atrophy. Stable periventricular and patchy subcortical chronic white matter disease. Ventricular size is stable from prior exam. Vascular: Minimal atherosclerotic calcifications of carotid siphon. Skull: No skull fracture is noted. There is scalp swelling left posterior parietal region high convexity. Left scalp subcutaneous hematoma measures 3 cm length by 9 mm thickness Sinuses/Orbits: No paranasal sinuses air-fluid levels. Other: None CT CERVICAL SPINE FINDINGS Comparison exam 05/06/2013 Alignment: No change in alignment from prior exam. Again noted chronic anterolisthesis about 3.4 mm C3 on C4 vertebral body. Stable chronic anterolisthesis about 3 mm C4 on C5 vertebral body. Stable about 4 mm anterolisthesis C7 on T1 vertebral body. Skull base and vertebrae: No acute fracture or subluxation. There is disc space flattening with mild anterior and mild posterior spurring at C2-C3 level. Stable mild disc space flattening at C3-C4 and C4-C5 level. Degenerative changes C1-C2 articulation. There is moderate disc space flattening with mild anterior and mild posterior spurring at C5-C6 and C6-C7 level. Moderate disc space flattening at C7-T1 level. Soft tissues and spinal canal: No prevertebral soft tissue swelling. Spinal canal is patent. Cervical airway is patent. Disc levels: Disc space flattening at C2-C3 level, C3-C4 level, C5-C6, C6-C7 and C7-T1 level. Upper chest: There is no pneumothorax in visualized lung apices. Multilevel facet degenerative changes are noted. Other: None IMPRESSION: 1. No acute intracranial abnormality. Stable atrophy and chronic white matter disease. No definite acute cortical infarction. There is scalp swelling and small subcutaneous hematoma in left posterior parietal scalp  region high convexity. Probable small skin laceration at this level. Clinical correlation is necessary. 2. No cervical spine acute fracture or subluxation. Again noted anterolisthesis at C3-C4, C4-C5 and C7-T1 level. Stable multilevel degenerative changes as described above. No prevertebral soft tissue swelling. Electronically Signed   By: Lahoma Crocker M.D.   On: 06/04/2016 09:17   Ct Cervical Spine Wo Contrast Result Date: 06/04/2016 CLINICAL DATA:  Pt slipped on a pile of magazines, causing him to fall back and hit his left head on a chair. Pt is on Plavix. Pt is alert and oriented; rt side neck pain started before the fall; EXAM: CT HEAD WITHOUT CONTRAST CT CERVICAL SPINE WITHOUT CONTRAST TECHNIQUE: Multidetector CT imaging of the head and cervical spine was performed following the standard protocol without intravenous contrast. Multiplanar CT image reconstructions of the cervical spine were also generated. COMPARISON:  10/30/2013 FINDINGS: CT HEAD FINDINGS Brain: No intracranial hemorrhage, mass effect or midline shift. No acute cortical infarction. No mass lesion is noted on this unenhanced scan. Stable mild cerebral atrophy. Stable periventricular and patchy subcortical chronic white matter disease. Ventricular size is stable from prior exam. Vascular: Minimal atherosclerotic calcifications of carotid siphon. Skull: No skull fracture is noted. There is scalp swelling left posterior parietal region high convexity. Left scalp subcutaneous hematoma measures 3 cm length by 9 mm thickness Sinuses/Orbits: No paranasal sinuses air-fluid levels. Other: None CT CERVICAL SPINE FINDINGS Comparison exam 05/06/2013 Alignment: No change in alignment from prior exam. Again noted chronic anterolisthesis about 3.4 mm C3 on C4 vertebral body. Stable chronic anterolisthesis about 3 mm C4  on C5 vertebral body. Stable about 4 mm anterolisthesis C7 on T1 vertebral body. Skull base and vertebrae: No acute fracture or subluxation.  There is disc space flattening with mild anterior and mild posterior spurring at C2-C3 level. Stable mild disc space flattening at C3-C4 and C4-C5 level. Degenerative changes C1-C2 articulation. There is moderate disc space flattening with mild anterior and mild posterior spurring at C5-C6 and C6-C7 level. Moderate disc space flattening at C7-T1 level. Soft tissues and spinal canal: No prevertebral soft tissue swelling. Spinal canal is patent. Cervical airway is patent. Disc levels: Disc space flattening at C2-C3 level, C3-C4 level, C5-C6, C6-C7 and C7-T1 level. Upper chest: There is no pneumothorax in visualized lung apices. Multilevel facet degenerative changes are noted. Other: None IMPRESSION: 1. No acute intracranial abnormality. Stable atrophy and chronic white matter disease. No definite acute cortical infarction. There is scalp swelling and small subcutaneous hematoma in left posterior parietal scalp region high convexity. Probable small skin laceration at this level. Clinical correlation is necessary. 2. No cervical spine acute fracture or subluxation. Again noted anterolisthesis at C3-C4, C4-C5 and C7-T1 level. Stable multilevel degenerative changes as described above. No prevertebral soft tissue swelling. Electronically Signed   By: Lahoma Crocker M.D.   On: 06/04/2016 09:17    1050:  CT reassuring. Lac repaired by APP (see separate note). Td UTD per pt. Dx and testing d/w pt and family.  Questions answered.  Verb understanding, agreeable to d/c home with outpt f/u.      Final Clinical Impressions(s) / ED Diagnoses   Final diagnoses:  None    New Prescriptions New Prescriptions   No medications on file      Francine Graven, DO 06/09/16 1723

## 2016-06-13 ENCOUNTER — Encounter: Payer: Self-pay | Admitting: Internal Medicine

## 2016-06-13 ENCOUNTER — Ambulatory Visit (INDEPENDENT_AMBULATORY_CARE_PROVIDER_SITE_OTHER): Payer: Medicare Other | Admitting: Internal Medicine

## 2016-06-13 DIAGNOSIS — S0101XD Laceration without foreign body of scalp, subsequent encounter: Secondary | ICD-10-CM

## 2016-06-13 DIAGNOSIS — S0100XA Unspecified open wound of scalp, initial encounter: Secondary | ICD-10-CM | POA: Insufficient documentation

## 2016-06-13 NOTE — Progress Notes (Signed)
Pre-visit discussion using our clinic review tool. No additional management support is needed unless otherwise documented below in the visit note.  

## 2016-06-13 NOTE — Assessment & Plan Note (Signed)
Staples removed

## 2016-06-13 NOTE — Progress Notes (Signed)
Subjective:  Patient ID: [name], male    DOB: [birth year]  Age: 71 y.o. MRN: [id number]  CC: Follow-up (staple removal of head, HTN, )   HPI DARRION WYSZYNSKI presents for staples removal - L occip area  Outpatient Medications Prior to Visit  Medication Sig Dispense Refill  . albuterol (PROVENTIL HFA;VENTOLIN HFA) 108 (90 Base) MCG/ACT inhaler Inhale 1-2 puffs into the lungs every 4 (four) hours as needed for wheezing or shortness of breath.    . alfuzosin (UROXATRAL) 10 MG 24 hr tablet Take 10 mg by mouth daily.  0  . ALPRAZolam (XANAX) 0.5 MG tablet Take 1 tablet (0.5 mg total) by mouth 2 (two) times daily as needed. for anxiety 60 tablet 3  . Cholecalciferol (VITAMIN D3) 2000 UNITS capsule Take 1 capsule (2,000 Units total) by mouth daily. 100 capsule 3  . clopidogrel (PLAVIX) 75 MG tablet TAKE 1 TABLET BY MOUTH EVERY DAY WITH BREAKFAST 90 tablet 3  . COLCRYS 0.6 MG tablet Take 1 tablet by mouth 2 (two) times daily.  2  . FLUoxetine (PROZAC) 40 MG capsule Take 1 capsule (40 mg total) by mouth 2 (two) times daily. 180 capsule 2  . fluticasone (FLONASE) 50 MCG/ACT nasal spray INSTILL 2 SPRAYS IN EACH NOSTRIL EVERY DAY 16 g 11  . gabapentin (NEURONTIN) 600 MG tablet Take 1 tablet (600 mg total) by mouth 3 (three) times daily. 90 tablet 5  . HYDROmorphone (DILAUDID) 2 MG tablet Take 1-2 tablets (2-4 mg total) by mouth 2 (two) times daily as needed for severe pain. Please fill on or after 06/04/16 120 tablet 0  . losartan-hydrochlorothiazide (HYZAAR) 50-12.5 MG tablet TAKE 1 TABLET BY MOUTH DAILY 90 tablet 2  . Melatonin 5 MG TABS Take 5 mg by mouth at bedtime.    . metoprolol tartrate (LOPRESSOR) 25 MG tablet Take 1 tablet (25 mg total) by mouth 2 (two) times daily. 180 tablet 1  . Omega-3 Fatty Acids (FISH OIL OMEGA-3) 1000 MG CAPS Take 1,000-2,000 mg by mouth 2 (two) times daily. Take 2000 mg in the morning and 1000 mg at night    . ondansetron (ZOFRAN) 4 MG tablet Take 1  tablet (4 mg total) by mouth every 8 (eight) hours as needed for nausea or vomiting. 20 tablet 1  . pantoprazole (PROTONIX) 40 MG tablet Take 1 tablet (40 mg total) by mouth 2 (two) times daily. 180 tablet 1  . pravastatin (PRAVACHOL) 20 MG tablet Take 1 tablet (20 mg total) by mouth daily. 90 tablet 1  . predniSONE (DELTASONE) 5 MG tablet take 48m daily  3   No facility-administered medications prior to visit.     ROS Review of Systems   Objective:  BP 112/76   Pulse (!) 105   Temp 98.2 F (36.8 C) (Oral)   Resp 16   Ht 5' 6"  (1.676 m)   Wt 154 lb 8 oz (70.1 kg)   SpO2 99%   BMI 24.94 kg/m   BP Readings from Last 3 Encounters:  06/13/16 112/76  06/04/16 110/70  06/04/16 121/83    Wt Readings from Last 3 Encounters:  06/13/16 154 lb 8 oz (70.1 kg)  06/04/16 151 lb (68.5 kg)  06/04/16 155 lb (70.3 kg)    Physical Exam   Staples removed FTF>15 min - procedure and discussion  Lab Results  Component Value Date   WBC 6.6 09/28/2015   HGB 9.6 (L) 09/28/2015   HCT 28.4 (L) 09/28/2015  PLT 324.0 09/28/2015   GLUCOSE 107 (H) 09/28/2015   CHOL 128 03/10/2015   TRIG 95.0 03/10/2015   HDL 39.30 03/10/2015   LDLCALC 70 03/10/2015   ALT 15 03/10/2015   AST 16 03/10/2015   NA 131 (L) 09/28/2015   K 3.8 09/28/2015   CL 94 (L) 09/28/2015   CREATININE 0.85 09/28/2015   BUN 17 09/28/2015   CO2 33 (H) 09/28/2015   TSH 1.10 09/28/2015   PSA 1.95 10/15/2010   INR 1.1 (H) 06/14/2014   HGBA1C 6.0 09/28/2015    Ct Head Wo Contrast  Result Date: 06/04/2016 CLINICAL DATA:  Pt slipped on a pile of magazines, causing him to fall back and hit his left head on a chair. Pt is on Plavix. Pt is alert and oriented; rt side neck pain started before the fall; EXAM: CT HEAD WITHOUT CONTRAST CT CERVICAL SPINE WITHOUT CONTRAST TECHNIQUE: Multidetector CT imaging of the head and cervical spine was performed following the standard protocol without intravenous contrast. Multiplanar CT  image reconstructions of the cervical spine were also generated. COMPARISON:  10/30/2013 FINDINGS: CT HEAD FINDINGS Brain: No intracranial hemorrhage, mass effect or midline shift. No acute cortical infarction. No mass lesion is noted on this unenhanced scan. Stable mild cerebral atrophy. Stable periventricular and patchy subcortical chronic white matter disease. Ventricular size is stable from prior exam. Vascular: Minimal atherosclerotic calcifications of carotid siphon. Skull: No skull fracture is noted. There is scalp swelling left posterior parietal region high convexity. Left scalp subcutaneous hematoma measures 3 cm length by 9 mm thickness Sinuses/Orbits: No paranasal sinuses air-fluid levels. Other: None CT CERVICAL SPINE FINDINGS Comparison exam 05/06/2013 Alignment: No change in alignment from prior exam. Again noted chronic anterolisthesis about 3.4 mm C3 on C4 vertebral body. Stable chronic anterolisthesis about 3 mm C4 on C5 vertebral body. Stable about 4 mm anterolisthesis C7 on T1 vertebral body. Skull base and vertebrae: No acute fracture or subluxation. There is disc space flattening with mild anterior and mild posterior spurring at C2-C3 level. Stable mild disc space flattening at C3-C4 and C4-C5 level. Degenerative changes C1-C2 articulation. There is moderate disc space flattening with mild anterior and mild posterior spurring at C5-C6 and C6-C7 level. Moderate disc space flattening at C7-T1 level. Soft tissues and spinal canal: No prevertebral soft tissue swelling. Spinal canal is patent. Cervical airway is patent. Disc levels: Disc space flattening at C2-C3 level, C3-C4 level, C5-C6, C6-C7 and C7-T1 level. Upper chest: There is no pneumothorax in visualized lung apices. Multilevel facet degenerative changes are noted. Other: None IMPRESSION: 1. No acute intracranial abnormality. Stable atrophy and chronic white matter disease. No definite acute cortical infarction. There is scalp swelling and  small subcutaneous hematoma in left posterior parietal scalp region high convexity. Probable small skin laceration at this level. Clinical correlation is necessary. 2. No cervical spine acute fracture or subluxation. Again noted anterolisthesis at C3-C4, C4-C5 and C7-T1 level. Stable multilevel degenerative changes as described above. No prevertebral soft tissue swelling. Electronically Signed   By: Lahoma Crocker M.D.   On: 06/04/2016 09:17   Ct Cervical Spine Wo Contrast  Result Date: 06/04/2016 CLINICAL DATA:  Pt slipped on a pile of magazines, causing him to fall back and hit his left head on a chair. Pt is on Plavix. Pt is alert and oriented; rt side neck pain started before the fall; EXAM: CT HEAD WITHOUT CONTRAST CT CERVICAL SPINE WITHOUT CONTRAST TECHNIQUE: Multidetector CT imaging of the head and cervical spine was  performed following the standard protocol without intravenous contrast. Multiplanar CT image reconstructions of the cervical spine were also generated. COMPARISON:  10/30/2013 FINDINGS: CT HEAD FINDINGS Brain: No intracranial hemorrhage, mass effect or midline shift. No acute cortical infarction. No mass lesion is noted on this unenhanced scan. Stable mild cerebral atrophy. Stable periventricular and patchy subcortical chronic white matter disease. Ventricular size is stable from prior exam. Vascular: Minimal atherosclerotic calcifications of carotid siphon. Skull: No skull fracture is noted. There is scalp swelling left posterior parietal region high convexity. Left scalp subcutaneous hematoma measures 3 cm length by 9 mm thickness Sinuses/Orbits: No paranasal sinuses air-fluid levels. Other: None CT CERVICAL SPINE FINDINGS Comparison exam 05/06/2013 Alignment: No change in alignment from prior exam. Again noted chronic anterolisthesis about 3.4 mm C3 on C4 vertebral body. Stable chronic anterolisthesis about 3 mm C4 on C5 vertebral body. Stable about 4 mm anterolisthesis C7 on T1 vertebral  body. Skull base and vertebrae: No acute fracture or subluxation. There is disc space flattening with mild anterior and mild posterior spurring at C2-C3 level. Stable mild disc space flattening at C3-C4 and C4-C5 level. Degenerative changes C1-C2 articulation. There is moderate disc space flattening with mild anterior and mild posterior spurring at C5-C6 and C6-C7 level. Moderate disc space flattening at C7-T1 level. Soft tissues and spinal canal: No prevertebral soft tissue swelling. Spinal canal is patent. Cervical airway is patent. Disc levels: Disc space flattening at C2-C3 level, C3-C4 level, C5-C6, C6-C7 and C7-T1 level. Upper chest: There is no pneumothorax in visualized lung apices. Multilevel facet degenerative changes are noted. Other: None IMPRESSION: 1. No acute intracranial abnormality. Stable atrophy and chronic white matter disease. No definite acute cortical infarction. There is scalp swelling and small subcutaneous hematoma in left posterior parietal scalp region high convexity. Probable small skin laceration at this level. Clinical correlation is necessary. 2. No cervical spine acute fracture or subluxation. Again noted anterolisthesis at C3-C4, C4-C5 and C7-T1 level. Stable multilevel degenerative changes as described above. No prevertebral soft tissue swelling. Electronically Signed   By: Lahoma Crocker M.D.   On: 06/04/2016 09:17    Assessment & Plan:   There are no diagnoses linked to this encounter. I am having Mr. Trant maintain his ondansetron, Vitamin D3, albuterol, predniSONE, FISH OIL OMEGA-3, alfuzosin, Melatonin, COLCRYS, losartan-hydrochlorothiazide, FLUoxetine, metoprolol tartrate, pantoprazole, pravastatin, ALPRAZolam, clopidogrel, fluticasone, gabapentin, and HYDROmorphone.  No orders of the defined types were placed in this encounter.    Follow-up: No Follow-up on file.  [name], MD

## 2016-06-18 ENCOUNTER — Other Ambulatory Visit: Payer: Medicare Other

## 2016-06-27 DIAGNOSIS — M47812 Spondylosis without myelopathy or radiculopathy, cervical region: Secondary | ICD-10-CM | POA: Diagnosis not present

## 2016-07-08 ENCOUNTER — Other Ambulatory Visit: Payer: Self-pay

## 2016-07-08 ENCOUNTER — Telehealth: Payer: Self-pay | Admitting: *Deleted

## 2016-07-08 MED ORDER — HYDROMORPHONE HCL 2 MG PO TABS: 2.0000 mg | ORAL_TABLET | Freq: Two times a day (BID) | ORAL | 0 refills | Status: DC | PRN

## 2016-07-08 NOTE — Telephone Encounter (Signed)
Rec'd call pt is requesting refills on his Dilaudid 2 mg...[name]

## 2016-07-08 NOTE — Telephone Encounter (Signed)
Ok to fill dilaudid rx with 0 refills per dr plotnikov, rx printed and waiting for dr plotnikov to sign  rx signed and placed up at front office

## 2016-07-09 ENCOUNTER — Telehealth: Payer: Self-pay | Admitting: *Deleted

## 2016-07-09 NOTE — Telephone Encounter (Signed)
Rec'd fax pt requesting refill on his alprazolam 0.76m. Last filled 06/06/16.../[name]

## 2016-07-09 NOTE — Telephone Encounter (Signed)
OK to fill this/these prescription(s) with additional refills x0. Use less pls Needs to have an OV every 3 months Thank you!

## 2016-07-10 MED ORDER — ALPRAZOLAM 0.5 MG PO TABS: 0.5000 mg | ORAL_TABLET | Freq: Two times a day (BID) | ORAL | 0 refills | Status: DC | PRN

## 2016-07-10 NOTE — Telephone Encounter (Signed)
Printed script, MD signed and faxed back to [facility]...[name]

## 2016-07-29 DIAGNOSIS — I10 Essential (primary) hypertension: Secondary | ICD-10-CM | POA: Diagnosis not present

## 2016-07-29 DIAGNOSIS — M47812 Spondylosis without myelopathy or radiculopathy, cervical region: Secondary | ICD-10-CM | POA: Diagnosis not present

## 2016-07-29 DIAGNOSIS — M5412 Radiculopathy, cervical region: Secondary | ICD-10-CM | POA: Diagnosis not present

## 2016-07-29 DIAGNOSIS — M0579 Rheumatoid arthritis with rheumatoid factor of multiple sites without organ or systems involvement: Secondary | ICD-10-CM | POA: Diagnosis not present

## 2016-07-29 DIAGNOSIS — M542 Cervicalgia: Secondary | ICD-10-CM | POA: Diagnosis not present

## 2016-08-02 DIAGNOSIS — M5136 Other intervertebral disc degeneration, lumbar region: Secondary | ICD-10-CM | POA: Diagnosis not present

## 2016-08-02 DIAGNOSIS — Z6824 Body mass index (BMI) 24.0-24.9, adult: Secondary | ICD-10-CM | POA: Diagnosis not present

## 2016-08-02 DIAGNOSIS — M150 Primary generalized (osteo)arthritis: Secondary | ICD-10-CM | POA: Diagnosis not present

## 2016-08-02 DIAGNOSIS — M1120 Other chondrocalcinosis, unspecified site: Secondary | ICD-10-CM | POA: Diagnosis not present

## 2016-08-02 DIAGNOSIS — R0600 Dyspnea, unspecified: Secondary | ICD-10-CM | POA: Diagnosis not present

## 2016-08-02 DIAGNOSIS — D6489 Other specified anemias: Secondary | ICD-10-CM | POA: Diagnosis not present

## 2016-08-02 DIAGNOSIS — M0579 Rheumatoid arthritis with rheumatoid factor of multiple sites without organ or systems involvement: Secondary | ICD-10-CM | POA: Diagnosis not present

## 2016-08-02 DIAGNOSIS — M25531 Pain in right wrist: Secondary | ICD-10-CM | POA: Diagnosis not present

## 2016-08-05 ENCOUNTER — Telehealth: Payer: Self-pay | Admitting: Internal Medicine

## 2016-08-05 NOTE — Telephone Encounter (Signed)
Would like to know if Dr. Alain Marion has received any faxed labs from their office within the last week to two?  I did notify office that we were having issues with our fax since the [facility] came through and suggested faxing again to be on the safe side.

## 2016-08-06 NOTE — Progress Notes (Signed)
Pre visit review using our clinic review tool, if applicable. No additional management support is needed unless otherwise documented below in the visit note. 

## 2016-08-06 NOTE — Telephone Encounter (Signed)
Received fax, gave to Dr. Alain Marion to review

## 2016-08-06 NOTE — Progress Notes (Addendum)
Subjective:   [name] is a 71 y.o. male who presents for an Initial Medicare Annual Wellness Visit.  Review of Systems  No ROS.  Medicare Wellness Visit.  Cardiac Risk Factors include: dyslipidemia;hypertension;male gender;advanced age (>13mn, >>76women);sedentary lifestyle Sleep patterns: has frequent nighttime awakenings, gets up 2-3 times nightly to void and hours of sleep varies nightly. Patient reports long-term insomnia issues, discussed recommended sleep tips and stress reduction tips.  Home Safety/Smoke Alarms: Feels safe in home. Smoke alarms in place.    Living environment; residence and Firearm Safety: 2-story house, equipment: Walkers, Type: RConservation officer, nature HHydrologist Type: Tub Seat/Chair and BSC, no firearms. Seat Belt Safety/Bike Helmet: Wears seat belt.   Counseling:   Eye Exam-  Appointment yearly Dental- every 6 months  Male:   CCS- Last 01/31/12, history of precancerous polyps, recall 5 years   PSA-  Lab Results  Component Value Date   PSA 1.95 10/15/2010   PSA 1.77 10/06/2009   PSA 1.64 10/11/2008     Objective:    Today's Vitals   08/07/16 1028  BP: 128/76  Pulse: 62  Resp: 20  SpO2: 98%  Weight: 149 lb (67.6 kg)  Height: 5' 6"  (1.676 m)   Body mass index is 24.05 kg/m.  Current Medications (verified) Outpatient Encounter Prescriptions as of 08/07/2016  Medication Sig  . albuterol (PROVENTIL HFA;VENTOLIN HFA) 108 (90 Base) MCG/ACT inhaler Inhale 1-2 puffs into the lungs every 4 (four) hours as needed for wheezing or shortness of breath.  . alfuzosin (UROXATRAL) 10 MG 24 hr tablet Take 10 mg by mouth daily.  .Marland KitchenALPRAZolam (XANAX) 0.5 MG tablet Take 1 tablet (0.5 mg total) by mouth 2 (two) times daily as needed. for anxiety  . Cholecalciferol (VITAMIN D3) 2000 UNITS capsule Take 1 capsule (2,000 Units total) by mouth daily.  . clopidogrel (PLAVIX) 75 MG tablet TAKE 1 TABLET BY MOUTH EVERY DAY WITH BREAKFAST  . COLCRYS 0.6 MG tablet  Take 1 tablet by mouth 2 (two) times daily.  .Marland KitchenFLUoxetine (PROZAC) 40 MG capsule Take 1 capsule (40 mg total) by mouth 2 (two) times daily.  . fluticasone (FLONASE) 50 MCG/ACT nasal spray INSTILL 2 SPRAYS IN EACH NOSTRIL EVERY DAY  . gabapentin (NEURONTIN) 600 MG tablet Take 1 tablet (600 mg total) by mouth 3 (three) times daily.  .Marland KitchenHYDROmorphone (DILAUDID) 2 MG tablet Take 1-2 tablets (2-4 mg total) by mouth 2 (two) times daily as needed for severe pain. Please fill on or after 06/04/16  . losartan-hydrochlorothiazide (HYZAAR) 50-12.5 MG tablet TAKE 1 TABLET BY MOUTH DAILY  . Melatonin 5 MG TABS Take 5 mg by mouth at bedtime.  . metoprolol tartrate (LOPRESSOR) 25 MG tablet Take 1 tablet (25 mg total) by mouth 2 (two) times daily.  . Omega-3 Fatty Acids (FISH OIL OMEGA-3) 1000 MG CAPS Take 1,000-2,000 mg by mouth 2 (two) times daily. Take 2000 mg in the morning and 1000 mg at night  . ondansetron (ZOFRAN) 4 MG tablet Take 1 tablet (4 mg total) by mouth every 8 (eight) hours as needed for nausea or vomiting.  . pantoprazole (PROTONIX) 40 MG tablet Take 1 tablet (40 mg total) by mouth 2 (two) times daily.  . pravastatin (PRAVACHOL) 20 MG tablet Take 1 tablet (20 mg total) by mouth daily.  . predniSONE (DELTASONE) 5 MG tablet take 560mdaily   No facility-administered encounter medications on file as of 08/07/2016.     Allergies (verified) Cefepime; Morphine and related;  Penicillins; and Percocet [oxycodone-acetaminophen]   History: Past Medical History:  Diagnosis Date  . Alcoholism /alcohol abuse ([place])    per family  . Allergic rhinitis   . Anxiety   . Bacterial infection   . CAD (coronary artery disease)    minimal coronary plaque in the LAD and right coronary system. 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 7-06. Last nuclear stress 11-17-06 fixed anterior/ inferior defect, no inducible ischemia, EF 81%  . Chronic back pain      "all over back"  . Chronic neck pain   . COPD (chronic obstructive pulmonary disease) ([place])   . Depression   . Diverticulosis   . Dysrhythmia 01-24-12   past hx. A.Fib x1 episode-responded to med.  . Falls frequently    "since 02/2013" (06/16/2013)  . GERD (gastroesophageal reflux disease)   . Hemochromatosis    dx'd 14 yrs ago last ferritin Aug 11, 08 52 (22-322), Fe 136 ("I had 250 phlebotomies for that")  . High cholesterol    hx  . Hx of colonic polyps   . Hx of colonoscopy   . Hypertension   . Migraine    "for 3 months in 2015; related to infection in my back" (06/17/2014)  . Myocardial infarction [facility]) 2006   "related to catheterization"  . Narcotic abuse    per family  . Osteoarthritis   . PONV (postoperative nausea and vomiting)   . RA (rheumatoid arthritis) ([place])   . Sleep apnea    "never dx'd;" (06/17/2014))   Past Surgical History:  Procedure Laterality Date  . ABDOMINAL ADHESION SURGERY  ~ 1968  . ANKLE RECONSTRUCTION Right 6-09   [facility]  . APPENDECTOMY  ~ 1956  . BONE TUMOR RESECTION  ~ 1954   "taken off my mastoid"  . CARPAL TUNNEL RELEASE Right 1990's  . CATARACT EXTRACTION, BILATERAL Bilateral 01-24-12  . CORONARY ANGIOPLASTY WITH STENT PLACEMENT  2006   "while repairing 1st stent, a second area tore and they had to place 2nd stent " ?LAD & CX  . CORONARY ANGIOPLASTY WITH STENT PLACEMENT  06/17/2014  . CYST EXCISION  "several OR's"   "backX 2, back of my neck, face, inside right bicept, chest, wrist"  . FOOT SURGERY Right 11-08   for removal of bone spurs-  . FRACTURE SURGERY    . HAMMER TOE SURGERY Right 07/2012   "broke 4 hammertoes"   . HARDWARE REMOVAL  03/09/2012   Procedure: HARDWARE REMOVAL;  Surgeon: Nita Sells, MD;  Location: [place];  Service: Orthopedics;  Laterality: Left;  Hardware Removal from Left Shoulder  . HARVEST BONE GRAFT  02/06/2012   Procedure: HARVEST ILIAC BONE GRAFT;  Surgeon: Nita Sells, MD;  Location: [facility];  Service: Orthopedics;;  bone marrow aspirqation   . INGUINAL HERNIA REPAIR Bilateral   . JOINT REPLACEMENT    . KNEE SURGERY Left ~ 2003   "6-12 months after uni knee removed synovial sack"  . LEFT HEART CATHETERIZATION WITH CORONARY ANGIOGRAM N/A 06/17/2014   PCI of diffuse severe stenosis in the proximal to mid LAD using overlapping drug-eluting stents.  . LUMBAR WOUND DEBRIDEMENT N/A 03/17/2013   Procedure: Incision and drainage of superficial lumbar wound;  Surgeon: Floyce Stakes, MD;  Location: [place] [facility];  Service: Neurosurgery;  Laterality: N/A;  Incision and drainage of superficial lumbar wound  . MECKEL DIVERTICULUM EXCISION  ~ 1956  . ORIF SHOULDER  FRACTURE  02/06/2012   Procedure: OPEN REDUCTION INTERNAL FIXATION (ORIF) SHOULDER FRACTURE;  Surgeon: Nita Sells, MD;  Location: [facility];  Service: Orthopedics;  Laterality: Left;  ORIF of a Left Shoulder Fracture with  Iliac Crest Bone Graft aspiration   . POSTERIOR LUMBAR FUSION  12-10   L4-5 diskectomy w/ fusion, cage placement and rods; [place]  . POSTERIOR LUMBAR FUSION 4 LEVEL N/A 03/02/2013   Procedure: Lumbar One to Sacral One Posterior lumbar interbody fusion;  Surgeon: Floyce Stakes, MD;  Location: [place] [facility];  Service: Neurosurgery;  Laterality: N/A;  L1 to S1 Posterior lumbar interbody fusion  . REPLACEMENT UNICONDYLAR JOINT KNEE Left ~ 2003   "~ 6 months after total knee replaced"  . SHOULDER ARTHROSCOPY Left ~ 2004 X 2   "@ [facility]; left bone splinter in & had to clean it out"  . TOTAL ANKLE REPLACEMENT Right 2008   at [facility] [address] [address]  . TOTAL KNEE ARTHROPLASTY Bilateral 2002  . TOTAL SHOULDER REPLACEMENT Left 2006  . TOTAL SHOULDER REPLACEMENT Right ~ 2007   Dr. Marlou Sa   Family History  Problem Relation Age of Onset  . Uterine cancer Mother     survivor  . Macular degeneration Mother   . Other Mother     ankle edema  . Lung cancer Mother   . Cancer Mother     ovarian,  lung  . Coronary artery disease Father   . Hypertension Father   . Prostate cancer Father   . Colon polyps Father   . Cancer Father     prostate  . Heart attack Brother   . Hyperlipidemia Brother   . Other Brother     Schizophrenic  . Thyroid disease Sister   . Coronary artery disease Maternal Aunt   . Heart attack Maternal Aunt   . Rheum arthritis Sister   . Hemochromatosis Sister   . Diabetes Neg Hx   . Colon cancer Neg Hx   . Esophageal cancer Neg Hx   . Rectal cancer Neg Hx   . Stomach cancer Neg Hx    Social History   Occupational History  . 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 Smoking status: Never Smoker  . Smokeless tobacco: Never Used  . Alcohol use No     Comment: "I do not drink anymore"  . Drug use: No  . Sexual activity: Yes    Partners: Female   Tobacco Counseling Counseling given: Not Answered   Activities of Daily Living In your present state of health, do you have any difficulty performing the following activities: 08/07/2016  Hearing? N  Vision? N  Difficulty concentrating or making decisions? N  Walking or climbing stairs? N  Dressing or bathing? N  Doing errands, shopping? N  Preparing Food and eating ? N  Using the Toilet? N  In the past six months, have you accidently leaked urine? Y  Do you have problems with loss of bowel control? N  Managing your Medications? N  Managing your Finances? N  Housekeeping or managing your Housekeeping? N  Some recent data might be hidden    Immunizations and Health Maintenance Immunization History  Administered Date(s) Administered  . Influenza Whole 01/01/2008, 01/10/2009, 01/10/2010, 03/16/2012  . Influenza,inj,Quad PF,36+ Mos 01/05/2013, 12/10/2013, 12/05/2014  . Influenza-Unspecified 12/25/2015  . Pneumococcal Conjugate-13 12/31/2013  . Pneumococcal Polysaccharide-23 01/01/2008, 03/10/2015  . Tdap 07/23/2011  . Zoster 04/06/2012   Health Maintenance Due  Topic Date Due  . Hepatitis C Screening  Jul 03, 1945    Patient Care Team: Cassandria Anger, MD as PCP - General (Internal Medicine) Jackolyn Confer, MD as Consulting Physician (General Surgery) Star Age, MD as Attending Physician (Neurology) Thayer Headings, MD as Consulting Physician (Infectious Diseases) Leeroy Cha, MD as Consulting Physician (Neurosurgery) Milus Banister, MD as Attending Physician (Gastroenterology)  Indicate any recent Medical Services you may have received from other than [facility] providers in the past year (date may be approximate).    Assessment:   This is a routine wellness examination for [place]. Physical assessment deferred to PCP.   Hearing/Vision screen Hearing Screening Comments: Able to hear conversational tones w/o difficulty. No issues reported.  Passed whisper test Vision Screening Comments: Wears glasses  Dietary issues and exercise activities discussed: Current Exercise Habits: The patient does not participate in regular exercise at present (Discussed water aerobics and chair exercises), Exercise limited by: orthopedic condition(s)  Diet (meal preparation, eat out, water intake, caffeinated beverages, dairy products, fruits and vegetables): Reports eating a healthy diet but has developed a pattern of not eating breakfast or lunch, then eating a nutritious dinner of mainly vegetables, fish, chicken, drinks 1-2 cups of coffee daily, limits soda, drinks 2-3 glasses of water daily.  Encouraged the patient to not skip meals and discussed strategies of re-training his body to start eating meals and/or supplementing with ensure, ensure coupons were provided. Encouraged patient to increase daily water intake.     Goals    . Increase mobility and appetite          Begin to exercise at [facility] 2-3 per week, drink ensure instead of skipping meals, travel and enjoy life.      Depression Screen PHQ 2/9 Scores 08/07/2016 08/07/2016 10/26/2015 12/10/2013   PHQ - 2 Score 0 0 0 0    Fall Risk Fall Risk  08/07/2016 10/26/2015 12/10/2013  Falls in the past year? No No Yes  Number falls in past yr: - - 2 or more  Risk Factor Category  - - High Fall Risk  Risk for fall due to : - - History of fall(s);Impaired balance/gait  Risk for fall due to (comments): - - "much better since out of hospital"    Cognitive Function:       Ad8 score reviewed for issues:  Issues making decisions: no  Less interest in hobbies / activities: no  Repeats questions, stories (family complaining): no  Trouble using ordinary gadgets (microwave, computer, phone): no  Forgets the month or year: no  Mismanaging finances: no  Remembering appts:no  Daily problems with thinking and/or memory: no Ad8 score is= 0   Screening Tests Health Maintenance  Topic Date Due  . Hepatitis C Screening  12/27/1945  . INFLUENZA VACCINE  11/06/2016  . COLONOSCOPY  01/30/2017  . TETANUS/TDAP  07/22/2021  . PNA vac Low Risk Adult  Completed        Plan:    Continue to eat heart healthy diet (full of fruits, vegetables, whole grains, lean protein, water--limit salt, fat, and sugar intake) and increase physical activity as tolerated.  Continue doing brain stimulating activities (puzzles, reading, adult coloring books, staying active) to keep memory sharp.   I have personally reviewed and noted the following in the patient's chart:   . Medical and social history . Use of alcohol, tobacco or illicit drugs  . Current medications and supplements . Functional ability and status . Nutritional status . Physical activity .  Advanced directives . List of other physicians . Hospitalizations, surgeries, and ER visits in previous 12 months . Vitals . Screenings to include cognitive, depression, and falls . Referrals and appointments  In addition, I have reviewed and discussed with patient certain preventive protocols, quality metrics, and best practice recommendations. A  written personalized care plan for preventive services as well as general preventive health recommendations were provided to patient.     Michiel Cowboy, RN   08/07/2016   Medical screening examination/treatment/procedure(s) were performed by non-physician practitioner and as supervising physician I was immediately available for consultation/collaboration. I agree with above. [name], MD

## 2016-08-07 ENCOUNTER — Ambulatory Visit (INDEPENDENT_AMBULATORY_CARE_PROVIDER_SITE_OTHER): Payer: Medicare Other | Admitting: *Deleted

## 2016-08-07 ENCOUNTER — Telehealth: Payer: Self-pay | Admitting: *Deleted

## 2016-08-07 VITALS — BP 128/76 | HR 62 | Resp 20 | Ht 66.0 in | Wt 149.0 lb

## 2016-08-07 DIAGNOSIS — Z0000 Encounter for general adult medical examination without abnormal findings: Secondary | ICD-10-CM

## 2016-08-07 NOTE — Patient Instructions (Signed)
Continue to eat heart healthy diet (full of fruits, vegetables, whole grains, lean protein, water--limit salt, fat, and sugar intake) and increase physical activity as tolerated.  Continue doing brain stimulating activities (puzzles, reading, adult coloring books, staying active) to keep memory sharp.   Web-site [employer] for memory games   [name] , Thank you for taking time to come for your Medicare Wellness Visit. I appreciate your ongoing commitment to your health goals. Please review the following plan we discussed and let me know if I can assist you in the future.   These are the goals we discussed: Goals    . Increase mobility and appetite          Begin to exercise at [facility] 2-3 per week, drink ensure instead of skipping meals, travel and enjoy life.       This is a list of the screening recommended for you and due dates:  Health Maintenance  Topic Date Due  .  Hepatitis C: One time screening is recommended by Center for Disease Control  (CDC) for  adults born from [birth year] through 1963.   [birth year]  . Flu Shot  11/06/2016  . Colon Cancer Screening  01/30/2017  . Tetanus Vaccine  07/22/2021  . Pneumonia vaccines  Completed

## 2016-08-07 NOTE — Telephone Encounter (Signed)
During AWV patient stated that he needs medication refills for albuterol and xanax. Also asked if he could change xanax prescription to 3 times daily. A follow-up appointment was scheduled with PCP on 08/20/16 to discuss medications.

## 2016-08-08 ENCOUNTER — Other Ambulatory Visit: Payer: Self-pay | Admitting: Internal Medicine

## 2016-08-08 ENCOUNTER — Other Ambulatory Visit: Payer: Self-pay | Admitting: *Deleted

## 2016-08-08 MED ORDER — ALBUTEROL SULFATE HFA 108 (90 BASE) MCG/ACT IN AERS: 1.0000 | INHALATION_SPRAY | RESPIRATORY_TRACT | 1 refills | Status: DC | PRN

## 2016-08-08 MED ORDER — ALPRAZOLAM 0.5 MG PO TABS: 0.5000 mg | ORAL_TABLET | Freq: Two times a day (BID) | ORAL | 0 refills | Status: DC | PRN

## 2016-08-08 NOTE — Telephone Encounter (Signed)
Called patient and left VM to inform that the requested prescription refills have been sent to his pharmacy. Requested that the patient call nurse back for any further questions or concerns.

## 2016-08-08 NOTE — Telephone Encounter (Signed)
OK Xanax bid OK Albuterol Thx

## 2016-08-08 NOTE — Telephone Encounter (Signed)
Faxed script for alprazolam to [facility]...[name]

## 2016-08-20 ENCOUNTER — Other Ambulatory Visit (INDEPENDENT_AMBULATORY_CARE_PROVIDER_SITE_OTHER): Payer: Medicare Other

## 2016-08-20 ENCOUNTER — Ambulatory Visit (INDEPENDENT_AMBULATORY_CARE_PROVIDER_SITE_OTHER): Payer: Medicare Other | Admitting: Internal Medicine

## 2016-08-20 ENCOUNTER — Encounter: Payer: Self-pay | Admitting: Internal Medicine

## 2016-08-20 DIAGNOSIS — F419 Anxiety disorder, unspecified: Secondary | ICD-10-CM

## 2016-08-20 DIAGNOSIS — E871 Hypo-osmolality and hyponatremia: Secondary | ICD-10-CM

## 2016-08-20 DIAGNOSIS — R7302 Impaired glucose tolerance (oral): Secondary | ICD-10-CM | POA: Diagnosis not present

## 2016-08-20 DIAGNOSIS — M05711 Rheumatoid arthritis with rheumatoid factor of right shoulder without organ or systems involvement: Secondary | ICD-10-CM | POA: Diagnosis not present

## 2016-08-20 DIAGNOSIS — I10 Essential (primary) hypertension: Secondary | ICD-10-CM

## 2016-08-20 DIAGNOSIS — G894 Chronic pain syndrome: Secondary | ICD-10-CM | POA: Diagnosis not present

## 2016-08-20 DIAGNOSIS — D500 Iron deficiency anemia secondary to blood loss (chronic): Secondary | ICD-10-CM

## 2016-08-20 LAB — BASIC METABOLIC PANEL
BUN: 14 mg/dL (ref 6–23)
CO2: 31 mEq/L (ref 19–32)
Calcium: 9.1 mg/dL (ref 8.4–10.5)
Chloride: 96 mEq/L (ref 96–112)
Creatinine, Ser: 0.87 mg/dL (ref 0.40–1.50)
GFR: 92.39 mL/min (ref >60.00)
Glucose, Bld: 93 mg/dL (ref 70–99)
Potassium: 3.9 mEq/L (ref 3.5–5.1)
Sodium: 131 mEq/L — ABNORMAL LOW (ref 135–145)

## 2016-08-20 MED ORDER — HYDROMORPHONE HCL 2 MG PO TABS: 2.0000 mg | ORAL_TABLET | Freq: Two times a day (BID) | ORAL | 0 refills | Status: DC | PRN

## 2016-08-20 MED ORDER — ALPRAZOLAM 0.5 MG PO TABS: 0.5000 mg | ORAL_TABLET | Freq: Two times a day (BID) | ORAL | 0 refills | Status: DC | PRN

## 2016-08-20 NOTE — Assessment & Plan Note (Signed)
As above.

## 2016-08-20 NOTE — Assessment & Plan Note (Signed)
Xanax prn Do not take w/Dilaudid  Potential benefits of a long term benzodiazepines  use as well as potential risks  and complications were explained to the patient and were aknowledged.

## 2016-08-20 NOTE — Assessment & Plan Note (Signed)
Labs

## 2016-08-20 NOTE — Assessment & Plan Note (Signed)
Predn 5 mg/d Better on Dilaudid  Potential benefits of a long term opioids use as well as potential risks (i.e. addiction risk, apnea etc) and complications (i.e. Somnolence, constipation and others) were explained to the patient and were aknowledged.

## 2016-08-20 NOTE — Progress Notes (Signed)
Subjective:  Patient ID: [name], male    DOB: Nov 27, 1945  Age: 71 y.o. MRN: [id number]  CC: No chief complaint on file.   HPI ZAYDAN PAPESH presents for chronic pain, RA, anxiety f/u  Outpatient Medications Prior to Visit  Medication Sig Dispense Refill  . albuterol (PROVENTIL HFA;VENTOLIN HFA) 108 (90 Base) MCG/ACT inhaler Inhale 1-2 puffs into the lungs every 4 (four) hours as needed for wheezing or shortness of breath. 1 Inhaler 1  . alfuzosin (UROXATRAL) 10 MG 24 hr tablet Take 10 mg by mouth daily.  0  . ALPRAZolam (XANAX) 0.5 MG tablet Take 1 tablet (0.5 mg total) by mouth 2 (two) times daily as needed. for anxiety 60 tablet 0  . Cholecalciferol (VITAMIN D3) 2000 UNITS capsule Take 1 capsule (2,000 Units total) by mouth daily. 100 capsule 3  . clopidogrel (PLAVIX) 75 MG tablet TAKE 1 TABLET BY MOUTH EVERY DAY WITH BREAKFAST 90 tablet 3  . COLCRYS 0.6 MG tablet Take 1 tablet by mouth 2 (two) times daily.  2  . FLUoxetine (PROZAC) 40 MG capsule Take 1 capsule (40 mg total) by mouth 2 (two) times daily. 180 capsule 2  . fluticasone (FLONASE) 50 MCG/ACT nasal spray INSTILL 2 SPRAYS IN EACH NOSTRIL EVERY DAY 16 g 11  . gabapentin (NEURONTIN) 600 MG tablet Take 1 tablet (600 mg total) by mouth 3 (three) times daily. 90 tablet 5  . HYDROmorphone (DILAUDID) 2 MG tablet Take 1-2 tablets (2-4 mg total) by mouth 2 (two) times daily as needed for severe pain. Please fill on or after 06/04/16 120 tablet 0  . losartan-hydrochlorothiazide (HYZAAR) 50-12.5 MG tablet TAKE 1 TABLET BY MOUTH DAILY 90 tablet 1  . Melatonin 5 MG TABS Take 5 mg by mouth at bedtime.    . metoprolol tartrate (LOPRESSOR) 25 MG tablet Take 1 tablet (25 mg total) by mouth 2 (two) times daily. 180 tablet 1  . Omega-3 Fatty Acids (FISH OIL OMEGA-3) 1000 MG CAPS Take 1,000-2,000 mg by mouth 2 (two) times daily. Take 2000 mg in the morning and 1000 mg at night    . ondansetron (ZOFRAN) 4 MG tablet Take 1 tablet (4  mg total) by mouth every 8 (eight) hours as needed for nausea or vomiting. 20 tablet 1  . pantoprazole (PROTONIX) 40 MG tablet Take 1 tablet (40 mg total) by mouth 2 (two) times daily. 180 tablet 1  . pravastatin (PRAVACHOL) 20 MG tablet Take 1 tablet (20 mg total) by mouth daily. 90 tablet 1  . predniSONE (DELTASONE) 5 MG tablet take 35m daily  3   No facility-administered medications prior to visit.     ROS Review of Systems  Constitutional: Positive for fatigue. Negative for appetite change and unexpected weight change.  HENT: Negative for congestion, nosebleeds, sneezing, sore throat and trouble swallowing.   Eyes: Negative for itching and visual disturbance.  Respiratory: Negative for cough.   Cardiovascular: Negative for chest pain, palpitations and leg swelling.  Gastrointestinal: Negative for abdominal distention, blood in stool, diarrhea and nausea.  Genitourinary: Negative for frequency and hematuria.  Musculoskeletal: Positive for arthralgias, back pain and gait problem. Negative for joint swelling and neck pain.  Skin: Negative for rash.  Neurological: Negative for dizziness, tremors, speech difficulty and weakness.  Psychiatric/Behavioral: Negative for agitation, dysphoric mood and sleep disturbance. The patient is nervous/anxious.     Objective:  BP 122/80 (BP Location: Left Arm, Patient Position: Sitting, Cuff Size: Normal)   Pulse 60  Temp 97.6 F (36.4 C) (Oral)   Ht 5' 6"  (1.676 m)   Wt 148 lb 0.6 oz (67.2 kg)   SpO2 98%   BMI 23.89 kg/m   BP Readings from Last 3 Encounters:  08/20/16 122/80  08/07/16 128/76  06/13/16 112/76    Wt Readings from Last 3 Encounters:  08/20/16 148 lb 0.6 oz (67.2 kg)  08/07/16 149 lb (67.6 kg)  06/13/16 154 lb 8 oz (70.1 kg)    Physical Exam  Constitutional: He is oriented to person, place, and time. He appears well-developed. No distress.  NAD  HENT:  Mouth/Throat: Oropharynx is clear and moist.  Eyes: Conjunctivae  are normal. Pupils are equal, round, and reactive to light.  Neck: Normal range of motion. No JVD present. No thyromegaly present.  Cardiovascular: Normal rate, regular rhythm, normal heart sounds and intact distal pulses.  Exam reveals no gallop and no friction rub.   No murmur heard. Pulmonary/Chest: Effort normal and breath sounds normal. No respiratory distress. He has no wheezes. He has no rales. He exhibits no tenderness.  Abdominal: Soft. Bowel sounds are normal. He exhibits no distension and no mass. There is no tenderness. There is no rebound and no guarding.  Musculoskeletal: Normal range of motion. He exhibits tenderness. He exhibits no edema.  Lymphadenopathy:    He has no cervical adenopathy.  Neurological: He is alert and oriented to person, place, and time. He has normal reflexes. No cranial nerve deficit. He exhibits normal muscle tone. He displays a negative Romberg sign. Coordination abnormal. Gait normal.  Skin: Skin is warm and dry. No rash noted.  Psychiatric: He has a normal mood and affect. His behavior is normal. Judgment and thought content normal.  painful joints, spine  Lab Results  Component Value Date   WBC 6.6 09/28/2015   HGB 9.6 (L) 09/28/2015   HCT 28.4 (L) 09/28/2015   PLT 324.0 09/28/2015   GLUCOSE 107 (H) 09/28/2015   CHOL 128 03/10/2015   TRIG 95.0 03/10/2015   HDL 39.30 03/10/2015   LDLCALC 70 03/10/2015   ALT 15 03/10/2015   AST 16 03/10/2015   NA 131 (L) 09/28/2015   K 3.8 09/28/2015   CL 94 (L) 09/28/2015   CREATININE 0.85 09/28/2015   BUN 17 09/28/2015   CO2 33 (H) 09/28/2015   TSH 1.10 09/28/2015   PSA 1.95 10/15/2010   INR 1.1 (H) 06/14/2014   HGBA1C 6.0 09/28/2015    Ct Head Wo Contrast  Result Date: 06/04/2016 CLINICAL DATA:  Pt slipped on a pile of magazines, causing him to fall back and hit his left head on a chair. Pt is on Plavix. Pt is alert and oriented; rt side neck pain started before the fall; EXAM: CT HEAD WITHOUT  CONTRAST CT CERVICAL SPINE WITHOUT CONTRAST TECHNIQUE: Multidetector CT imaging of the head and cervical spine was performed following the standard protocol without intravenous contrast. Multiplanar CT image reconstructions of the cervical spine were also generated. COMPARISON:  10/30/2013 FINDINGS: CT HEAD FINDINGS Brain: No intracranial hemorrhage, mass effect or midline shift. No acute cortical infarction. No mass lesion is noted on this unenhanced scan. Stable mild cerebral atrophy. Stable periventricular and patchy subcortical chronic white matter disease. Ventricular size is stable from prior exam. Vascular: Minimal atherosclerotic calcifications of carotid siphon. Skull: No skull fracture is noted. There is scalp swelling left posterior parietal region high convexity. Left scalp subcutaneous hematoma measures 3 cm length by 9 mm thickness Sinuses/Orbits: No paranasal sinuses air-fluid  levels. Other: None CT CERVICAL SPINE FINDINGS Comparison exam 05/06/2013 Alignment: No change in alignment from prior exam. Again noted chronic anterolisthesis about 3.4 mm C3 on C4 vertebral body. Stable chronic anterolisthesis about 3 mm C4 on C5 vertebral body. Stable about 4 mm anterolisthesis C7 on T1 vertebral body. Skull base and vertebrae: No acute fracture or subluxation. There is disc space flattening with mild anterior and mild posterior spurring at C2-C3 level. Stable mild disc space flattening at C3-C4 and C4-C5 level. Degenerative changes C1-C2 articulation. There is moderate disc space flattening with mild anterior and mild posterior spurring at C5-C6 and C6-C7 level. Moderate disc space flattening at C7-T1 level. Soft tissues and spinal canal: No prevertebral soft tissue swelling. Spinal canal is patent. Cervical airway is patent. Disc levels: Disc space flattening at C2-C3 level, C3-C4 level, C5-C6, C6-C7 and C7-T1 level. Upper chest: There is no pneumothorax in visualized lung apices. Multilevel facet  degenerative changes are noted. Other: None IMPRESSION: 1. No acute intracranial abnormality. Stable atrophy and chronic white matter disease. No definite acute cortical infarction. There is scalp swelling and small subcutaneous hematoma in left posterior parietal scalp region high convexity. Probable small skin laceration at this level. Clinical correlation is necessary. 2. No cervical spine acute fracture or subluxation. Again noted anterolisthesis at C3-C4, C4-C5 and C7-T1 level. Stable multilevel degenerative changes as described above. No prevertebral soft tissue swelling. Electronically Signed   By: Lahoma Crocker M.D.   On: 06/04/2016 09:17   Ct Cervical Spine Wo Contrast  Result Date: 06/04/2016 CLINICAL DATA:  Pt slipped on a pile of magazines, causing him to fall back and hit his left head on a chair. Pt is on Plavix. Pt is alert and oriented; rt side neck pain started before the fall; EXAM: CT HEAD WITHOUT CONTRAST CT CERVICAL SPINE WITHOUT CONTRAST TECHNIQUE: Multidetector CT imaging of the head and cervical spine was performed following the standard protocol without intravenous contrast. Multiplanar CT image reconstructions of the cervical spine were also generated. COMPARISON:  10/30/2013 FINDINGS: CT HEAD FINDINGS Brain: No intracranial hemorrhage, mass effect or midline shift. No acute cortical infarction. No mass lesion is noted on this unenhanced scan. Stable mild cerebral atrophy. Stable periventricular and patchy subcortical chronic white matter disease. Ventricular size is stable from prior exam. Vascular: Minimal atherosclerotic calcifications of carotid siphon. Skull: No skull fracture is noted. There is scalp swelling left posterior parietal region high convexity. Left scalp subcutaneous hematoma measures 3 cm length by 9 mm thickness Sinuses/Orbits: No paranasal sinuses air-fluid levels. Other: None CT CERVICAL SPINE FINDINGS Comparison exam 05/06/2013 Alignment: No change in alignment from  prior exam. Again noted chronic anterolisthesis about 3.4 mm C3 on C4 vertebral body. Stable chronic anterolisthesis about 3 mm C4 on C5 vertebral body. Stable about 4 mm anterolisthesis C7 on T1 vertebral body. Skull base and vertebrae: No acute fracture or subluxation. There is disc space flattening with mild anterior and mild posterior spurring at C2-C3 level. Stable mild disc space flattening at C3-C4 and C4-C5 level. Degenerative changes C1-C2 articulation. There is moderate disc space flattening with mild anterior and mild posterior spurring at C5-C6 and C6-C7 level. Moderate disc space flattening at C7-T1 level. Soft tissues and spinal canal: No prevertebral soft tissue swelling. Spinal canal is patent. Cervical airway is patent. Disc levels: Disc space flattening at C2-C3 level, C3-C4 level, C5-C6, C6-C7 and C7-T1 level. Upper chest: There is no pneumothorax in visualized lung apices. Multilevel facet degenerative changes are noted. Other: None  IMPRESSION: 1. No acute intracranial abnormality. Stable atrophy and chronic white matter disease. No definite acute cortical infarction. There is scalp swelling and small subcutaneous hematoma in left posterior parietal scalp region high convexity. Probable small skin laceration at this level. Clinical correlation is necessary. 2. No cervical spine acute fracture or subluxation. Again noted anterolisthesis at C3-C4, C4-C5 and C7-T1 level. Stable multilevel degenerative changes as described above. No prevertebral soft tissue swelling. Electronically Signed   By: Lahoma Crocker M.D.   On: 06/04/2016 09:17    Assessment & Plan:   There are no diagnoses linked to this encounter. I am having Mr. Bruney maintain his ondansetron, Vitamin D3, predniSONE, FISH OIL OMEGA-3, alfuzosin, Melatonin, COLCRYS, FLUoxetine, metoprolol tartrate, pantoprazole, pravastatin, clopidogrel, fluticasone, gabapentin, HYDROmorphone, losartan-hydrochlorothiazide, ALPRAZolam, and  albuterol.  No orders of the defined types were placed in this encounter.    Follow-up: No Follow-up on file.  [name], MD

## 2016-08-20 NOTE — Assessment & Plan Note (Signed)
CBC

## 2016-08-20 NOTE — Assessment & Plan Note (Signed)
Cont w/Lopressor and Losartan

## 2016-08-20 NOTE — Patient Instructions (Signed)
MC well w/Jill

## 2016-08-21 ENCOUNTER — Telehealth: Payer: Self-pay | Admitting: Internal Medicine

## 2016-08-21 NOTE — Telephone Encounter (Signed)
Closing note 

## 2016-08-21 NOTE — Telephone Encounter (Signed)
Pt returned your call on lab results. Gave MD response. He had no questions.

## 2016-08-30 ENCOUNTER — Other Ambulatory Visit: Payer: Self-pay | Admitting: *Deleted

## 2016-08-30 MED ORDER — METOPROLOL TARTRATE 25 MG PO TABS: 25.0000 mg | ORAL_TABLET | Freq: Two times a day (BID) | ORAL | 1 refills | Status: DC

## 2016-09-11 ENCOUNTER — Other Ambulatory Visit: Payer: Self-pay

## 2016-09-11 MED ORDER — ALBUTEROL SULFATE HFA 108 (90 BASE) MCG/ACT IN AERS: 1.0000 | INHALATION_SPRAY | RESPIRATORY_TRACT | 1 refills | Status: AC | PRN

## 2016-09-16 ENCOUNTER — Ambulatory Visit: Payer: Medicare Other | Admitting: Internal Medicine

## 2016-10-11 ENCOUNTER — Telehealth: Payer: Self-pay | Admitting: Internal Medicine

## 2016-10-11 MED ORDER — HYDROMORPHONE HCL 2 MG PO TABS: 2.0000 mg | ORAL_TABLET | Freq: Two times a day (BID) | ORAL | 0 refills | Status: DC | PRN

## 2016-10-11 NOTE — Telephone Encounter (Signed)
Needs dilaudid rx OV q 3 mo

## 2016-10-21 DIAGNOSIS — N5201 Erectile dysfunction due to arterial insufficiency: Secondary | ICD-10-CM | POA: Diagnosis not present

## 2016-10-21 DIAGNOSIS — R351 Nocturia: Secondary | ICD-10-CM | POA: Diagnosis not present

## 2016-10-21 DIAGNOSIS — R3915 Urgency of urination: Secondary | ICD-10-CM | POA: Diagnosis not present

## 2016-10-21 DIAGNOSIS — N401 Enlarged prostate with lower urinary tract symptoms: Secondary | ICD-10-CM | POA: Diagnosis not present

## 2016-10-30 ENCOUNTER — Telehealth: Payer: Self-pay | Admitting: Internal Medicine

## 2016-10-30 MED ORDER — ALPRAZOLAM 0.5 MG PO TABS: 0.5000 mg | ORAL_TABLET | Freq: Two times a day (BID) | ORAL | 2 refills | Status: DC | PRN

## 2016-10-30 NOTE — Telephone Encounter (Signed)
OK to fill this/these prescription(s) with additional refills x2 Needs to have an OV every 3 months Thank you!

## 2016-10-30 NOTE — Telephone Encounter (Signed)
Notified pt called refill into [facility] had to leave on pharmacy vm...[name]

## 2016-10-30 NOTE — Telephone Encounter (Signed)
Pt would like a refill of ALPRAZolam (XANAX) 0.5 MG tablet   Has 72moappt on 8/22

## 2016-11-03 ENCOUNTER — Other Ambulatory Visit: Payer: Self-pay | Admitting: Internal Medicine

## 2016-11-04 DIAGNOSIS — M47812 Spondylosis without myelopathy or radiculopathy, cervical region: Secondary | ICD-10-CM | POA: Diagnosis not present

## 2016-11-04 DIAGNOSIS — M0579 Rheumatoid arthritis with rheumatoid factor of multiple sites without organ or systems involvement: Secondary | ICD-10-CM | POA: Diagnosis not present

## 2016-11-05 DIAGNOSIS — M0579 Rheumatoid arthritis with rheumatoid factor of multiple sites without organ or systems involvement: Secondary | ICD-10-CM | POA: Diagnosis not present

## 2016-11-05 DIAGNOSIS — Z6825 Body mass index (BMI) 25.0-25.9, adult: Secondary | ICD-10-CM | POA: Diagnosis not present

## 2016-11-05 DIAGNOSIS — M25531 Pain in right wrist: Secondary | ICD-10-CM | POA: Diagnosis not present

## 2016-11-05 DIAGNOSIS — M5136 Other intervertebral disc degeneration, lumbar region: Secondary | ICD-10-CM | POA: Diagnosis not present

## 2016-11-05 DIAGNOSIS — E663 Overweight: Secondary | ICD-10-CM | POA: Diagnosis not present

## 2016-11-05 DIAGNOSIS — M1120 Other chondrocalcinosis, unspecified site: Secondary | ICD-10-CM | POA: Diagnosis not present

## 2016-11-05 DIAGNOSIS — R0600 Dyspnea, unspecified: Secondary | ICD-10-CM | POA: Diagnosis not present

## 2016-11-05 DIAGNOSIS — M150 Primary generalized (osteo)arthritis: Secondary | ICD-10-CM | POA: Diagnosis not present

## 2016-11-05 DIAGNOSIS — D6489 Other specified anemias: Secondary | ICD-10-CM | POA: Diagnosis not present

## 2016-11-07 ENCOUNTER — Telehealth: Payer: Self-pay | Admitting: Internal Medicine

## 2016-11-07 NOTE — Telephone Encounter (Signed)
States seen Rheumatologist yesterday.  States that red blood count was 8. Does have next appt with Plot on 8/21.  Would like to know if he needs to do anything about this meanwhile.

## 2016-11-09 NOTE — Telephone Encounter (Signed)
pls get me a report Thx

## 2016-11-11 NOTE — Telephone Encounter (Signed)
Pt called checking on this. He said that his Rheumatologist Leigh Aurora) faxed this over to [place]. Do you know if you have received it?

## 2016-11-13 NOTE — Telephone Encounter (Signed)
LM notifying pt that all Dr Alain Marion wants him to do is show up for his appt.

## 2016-11-26 ENCOUNTER — Ambulatory Visit (INDEPENDENT_AMBULATORY_CARE_PROVIDER_SITE_OTHER): Payer: Medicare Other | Admitting: Internal Medicine

## 2016-11-26 ENCOUNTER — Encounter: Payer: Self-pay | Admitting: Internal Medicine

## 2016-11-26 DIAGNOSIS — D500 Iron deficiency anemia secondary to blood loss (chronic): Secondary | ICD-10-CM | POA: Diagnosis not present

## 2016-11-26 DIAGNOSIS — R634 Abnormal weight loss: Secondary | ICD-10-CM | POA: Diagnosis not present

## 2016-11-26 DIAGNOSIS — I10 Essential (primary) hypertension: Secondary | ICD-10-CM | POA: Diagnosis not present

## 2016-11-26 MED ORDER — HYDROMORPHONE HCL 2 MG PO TABS: 2.0000 mg | ORAL_TABLET | Freq: Two times a day (BID) | ORAL | 0 refills | Status: DC | PRN

## 2016-11-26 MED ORDER — FLUTICASONE PROPIONATE 50 MCG/ACT NA SUSP: NASAL | 11 refills | Status: DC

## 2016-11-26 NOTE — Progress Notes (Signed)
Subjective:  Patient ID: [name], male    DOB: [birth year]  Age: 71 y.o. MRN: [id number]  CC: No chief complaint on file.   HPI MELFORD TULLIER presents for RA, chronic pain, HTN f/u Thinking of moving to [facility]  Outpatient Medications Prior to Visit  Medication Sig Dispense Refill  . albuterol (PROVENTIL HFA;VENTOLIN HFA) 108 (90 Base) MCG/ACT inhaler Inhale 1-2 puffs into the lungs every 4 (four) hours as needed for wheezing or shortness of breath. 1 Inhaler 1  . alfuzosin (UROXATRAL) 10 MG 24 hr tablet Take 10 mg by mouth daily.  0  . ALPRAZolam (XANAX) 0.5 MG tablet Take 1 tablet (0.5 mg total) by mouth 2 (two) times daily as needed. for anxiety 60 tablet 2  . Cholecalciferol (VITAMIN D3) 2000 UNITS capsule Take 1 capsule (2,000 Units total) by mouth daily. 100 capsule 3  . clopidogrel (PLAVIX) 75 MG tablet TAKE 1 TABLET BY MOUTH EVERY DAY WITH BREAKFAST 90 tablet 3  . COLCRYS 0.6 MG tablet Take 1 tablet by mouth 2 (two) times daily.  2  . FLUoxetine (PROZAC) 40 MG capsule Take 1 capsule (40 mg total) by mouth 2 (two) times daily. 180 capsule 2  . fluticasone (FLONASE) 50 MCG/ACT nasal spray INSTILL 2 SPRAYS IN EACH NOSTRIL EVERY DAY 16 g 11  . gabapentin (NEURONTIN) 600 MG tablet Take 1 tablet (600 mg total) by mouth 3 (three) times daily. 90 tablet 5  . HYDROmorphone (DILAUDID) 2 MG tablet Take 1-2 tablets (2-4 mg total) by mouth 2 (two) times daily as needed for severe pain. Please fill on or after 10/10/16 120 tablet 0  . losartan-hydrochlorothiazide (HYZAAR) 50-12.5 MG tablet TAKE 1 TABLET BY MOUTH DAILY 90 tablet 1  . losartan-hydrochlorothiazide (HYZAAR) 50-12.5 MG tablet TAKE 1 TABLET BY MOUTH DAILY 90 tablet 0  . Melatonin 5 MG TABS Take 5 mg by mouth at bedtime.    . metoprolol tartrate (LOPRESSOR) 25 MG tablet Take 1 tablet (25 mg total) by mouth 2 (two) times daily. 180 tablet 1  . Omega-3 Fatty Acids (FISH OIL OMEGA-3) 1000 MG CAPS Take 1,000-2,000 mg  by mouth 2 (two) times daily. Take 2000 mg in the morning and 1000 mg at night    . ondansetron (ZOFRAN) 4 MG tablet Take 1 tablet (4 mg total) by mouth every 8 (eight) hours as needed for nausea or vomiting. 20 tablet 1  . pantoprazole (PROTONIX) 40 MG tablet Take 1 tablet (40 mg total) by mouth 2 (two) times daily. 180 tablet 1  . pravastatin (PRAVACHOL) 20 MG tablet Take 1 tablet (20 mg total) by mouth daily. 90 tablet 1  . predniSONE (DELTASONE) 5 MG tablet take 18m daily  3   No facility-administered medications prior to visit.     ROS Review of Systems  Constitutional: Positive for fatigue. Negative for appetite change and unexpected weight change.  HENT: Negative for congestion, nosebleeds, sneezing, sore throat and trouble swallowing.   Eyes: Negative for itching and visual disturbance.  Respiratory: Negative for cough.   Cardiovascular: Negative for chest pain, palpitations and leg swelling.  Gastrointestinal: Negative for abdominal distention, blood in stool, diarrhea and nausea.  Genitourinary: Negative for frequency and hematuria.  Musculoskeletal: Positive for arthralgias, back pain and gait problem. Negative for joint swelling and neck pain.  Skin: Negative for rash.  Neurological: Negative for dizziness, tremors, speech difficulty and weakness.  Psychiatric/Behavioral: Negative for agitation, dysphoric mood and sleep disturbance. The patient is nervous/anxious.  Objective:  BP 128/76 (BP Location: Left Arm, Patient Position: Sitting, Cuff Size: Normal)   Pulse 69   Temp 98.4 F (36.9 C) (Oral)   Ht 5' 6"  (1.676 m)   Wt 151 lb (68.5 kg)   SpO2 98%   BMI 24.37 kg/m   BP Readings from Last 3 Encounters:  11/26/16 128/76  08/20/16 122/80  08/07/16 128/76    Wt Readings from Last 3 Encounters:  11/26/16 151 lb (68.5 kg)  08/20/16 148 lb 0.6 oz (67.2 kg)  08/07/16 149 lb (67.6 kg)    Physical Exam  Constitutional: He is oriented to person, place, and time.  He appears well-developed. No distress.  NAD  HENT:  Mouth/Throat: Oropharynx is clear and moist.  Eyes: Pupils are equal, round, and reactive to light. Conjunctivae are normal.  Neck: Normal range of motion. No JVD present. No thyromegaly present.  Cardiovascular: Normal rate, regular rhythm, normal heart sounds and intact distal pulses.  Exam reveals no gallop and no friction rub.   No murmur heard. Pulmonary/Chest: Effort normal and breath sounds normal. No respiratory distress. He has no wheezes. He has no rales. He exhibits no tenderness.  Abdominal: Soft. Bowel sounds are normal. He exhibits no distension and no mass. There is no tenderness. There is no rebound and no guarding.  Musculoskeletal: Normal range of motion. He exhibits tenderness. He exhibits no edema.  Lymphadenopathy:    He has no cervical adenopathy.  Neurological: He is alert and oriented to person, place, and time. He has normal reflexes. No cranial nerve deficit. He exhibits normal muscle tone. He displays a negative Romberg sign. Coordination and gait normal.  Skin: Skin is warm and dry. No rash noted.  Psychiatric: He has a normal mood and affect. His behavior is normal. Judgment and thought content normal.    Lab Results  Component Value Date   WBC 6.6 09/28/2015   HGB 9.6 (L) 09/28/2015   HCT 28.4 (L) 09/28/2015   PLT 324.0 09/28/2015   GLUCOSE 93 08/20/2016   CHOL 128 03/10/2015   TRIG 95.0 03/10/2015   HDL 39.30 03/10/2015   LDLCALC 70 03/10/2015   ALT 15 03/10/2015   AST 16 03/10/2015   NA 131 (L) 08/20/2016   K 3.9 08/20/2016   CL 96 08/20/2016   CREATININE 0.87 08/20/2016   BUN 14 08/20/2016   CO2 31 08/20/2016   TSH 1.10 09/28/2015   PSA 1.95 10/15/2010   INR 1.1 (H) 06/14/2014   HGBA1C 6.0 09/28/2015    Ct Head Wo Contrast  Result Date: 06/04/2016 CLINICAL DATA:  Pt slipped on a pile of magazines, causing him to fall back and hit his left head on a chair. Pt is on Plavix. Pt is alert and  oriented; rt side neck pain started before the fall; EXAM: CT HEAD WITHOUT CONTRAST CT CERVICAL SPINE WITHOUT CONTRAST TECHNIQUE: Multidetector CT imaging of the head and cervical spine was performed following the standard protocol without intravenous contrast. Multiplanar CT image reconstructions of the cervical spine were also generated. COMPARISON:  10/30/2013 FINDINGS: CT HEAD FINDINGS Brain: No intracranial hemorrhage, mass effect or midline shift. No acute cortical infarction. No mass lesion is noted on this unenhanced scan. Stable mild cerebral atrophy. Stable periventricular and patchy subcortical chronic white matter disease. Ventricular size is stable from prior exam. Vascular: Minimal atherosclerotic calcifications of carotid siphon. Skull: No skull fracture is noted. There is scalp swelling left posterior parietal region high convexity. Left scalp subcutaneous hematoma measures 3  cm length by 9 mm thickness Sinuses/Orbits: No paranasal sinuses air-fluid levels. Other: None CT CERVICAL SPINE FINDINGS Comparison exam 05/06/2013 Alignment: No change in alignment from prior exam. Again noted chronic anterolisthesis about 3.4 mm C3 on C4 vertebral body. Stable chronic anterolisthesis about 3 mm C4 on C5 vertebral body. Stable about 4 mm anterolisthesis C7 on T1 vertebral body. Skull base and vertebrae: No acute fracture or subluxation. There is disc space flattening with mild anterior and mild posterior spurring at C2-C3 level. Stable mild disc space flattening at C3-C4 and C4-C5 level. Degenerative changes C1-C2 articulation. There is moderate disc space flattening with mild anterior and mild posterior spurring at C5-C6 and C6-C7 level. Moderate disc space flattening at C7-T1 level. Soft tissues and spinal canal: No prevertebral soft tissue swelling. Spinal canal is patent. Cervical airway is patent. Disc levels: Disc space flattening at C2-C3 level, C3-C4 level, C5-C6, C6-C7 and C7-T1 level. Upper chest:  There is no pneumothorax in visualized lung apices. Multilevel facet degenerative changes are noted. Other: None IMPRESSION: 1. No acute intracranial abnormality. Stable atrophy and chronic white matter disease. No definite acute cortical infarction. There is scalp swelling and small subcutaneous hematoma in left posterior parietal scalp region high convexity. Probable small skin laceration at this level. Clinical correlation is necessary. 2. No cervical spine acute fracture or subluxation. Again noted anterolisthesis at C3-C4, C4-C5 and C7-T1 level. Stable multilevel degenerative changes as described above. No prevertebral soft tissue swelling. Electronically Signed   By: Lahoma Crocker M.D.   On: 06/04/2016 09:17   Ct Cervical Spine Wo Contrast  Result Date: 06/04/2016 CLINICAL DATA:  Pt slipped on a pile of magazines, causing him to fall back and hit his left head on a chair. Pt is on Plavix. Pt is alert and oriented; rt side neck pain started before the fall; EXAM: CT HEAD WITHOUT CONTRAST CT CERVICAL SPINE WITHOUT CONTRAST TECHNIQUE: Multidetector CT imaging of the head and cervical spine was performed following the standard protocol without intravenous contrast. Multiplanar CT image reconstructions of the cervical spine were also generated. COMPARISON:  10/30/2013 FINDINGS: CT HEAD FINDINGS Brain: No intracranial hemorrhage, mass effect or midline shift. No acute cortical infarction. No mass lesion is noted on this unenhanced scan. Stable mild cerebral atrophy. Stable periventricular and patchy subcortical chronic white matter disease. Ventricular size is stable from prior exam. Vascular: Minimal atherosclerotic calcifications of carotid siphon. Skull: No skull fracture is noted. There is scalp swelling left posterior parietal region high convexity. Left scalp subcutaneous hematoma measures 3 cm length by 9 mm thickness Sinuses/Orbits: No paranasal sinuses air-fluid levels. Other: None CT CERVICAL SPINE  FINDINGS Comparison exam 05/06/2013 Alignment: No change in alignment from prior exam. Again noted chronic anterolisthesis about 3.4 mm C3 on C4 vertebral body. Stable chronic anterolisthesis about 3 mm C4 on C5 vertebral body. Stable about 4 mm anterolisthesis C7 on T1 vertebral body. Skull base and vertebrae: No acute fracture or subluxation. There is disc space flattening with mild anterior and mild posterior spurring at C2-C3 level. Stable mild disc space flattening at C3-C4 and C4-C5 level. Degenerative changes C1-C2 articulation. There is moderate disc space flattening with mild anterior and mild posterior spurring at C5-C6 and C6-C7 level. Moderate disc space flattening at C7-T1 level. Soft tissues and spinal canal: No prevertebral soft tissue swelling. Spinal canal is patent. Cervical airway is patent. Disc levels: Disc space flattening at C2-C3 level, C3-C4 level, C5-C6, C6-C7 and C7-T1 level. Upper chest: There is no pneumothorax in  visualized lung apices. Multilevel facet degenerative changes are noted. Other: None IMPRESSION: 1. No acute intracranial abnormality. Stable atrophy and chronic white matter disease. No definite acute cortical infarction. There is scalp swelling and small subcutaneous hematoma in left posterior parietal scalp region high convexity. Probable small skin laceration at this level. Clinical correlation is necessary. 2. No cervical spine acute fracture or subluxation. Again noted anterolisthesis at C3-C4, C4-C5 and C7-T1 level. Stable multilevel degenerative changes as described above. No prevertebral soft tissue swelling. Electronically Signed   By: Lahoma Crocker M.D.   On: 06/04/2016 09:17    Assessment & Plan:   There are no diagnoses linked to this encounter. I am having Mr. Garay maintain his ondansetron, Vitamin D3, predniSONE, FISH OIL OMEGA-3, alfuzosin, Melatonin, COLCRYS, FLUoxetine, pantoprazole, pravastatin, clopidogrel, fluticasone, gabapentin,  losartan-hydrochlorothiazide, metoprolol tartrate, albuterol, HYDROmorphone, ALPRAZolam, and losartan-hydrochlorothiazide.  No orders of the defined types were placed in this encounter.    Follow-up: No Follow-up on file.  [name], MD

## 2016-11-26 NOTE — Assessment & Plan Note (Signed)
Iron po Hem ref

## 2016-11-26 NOTE — Assessment & Plan Note (Signed)
Lopressor Losartan

## 2016-11-26 NOTE — Assessment & Plan Note (Signed)
Wt Readings from Last 3 Encounters:  11/26/16 151 lb (68.5 kg)  08/20/16 148 lb 0.6 oz (67.2 kg)  08/07/16 149 lb (67.6 kg)

## 2016-12-02 DIAGNOSIS — I10 Essential (primary) hypertension: Secondary | ICD-10-CM | POA: Diagnosis not present

## 2016-12-02 DIAGNOSIS — M549 Dorsalgia, unspecified: Secondary | ICD-10-CM | POA: Diagnosis not present

## 2016-12-12 DIAGNOSIS — M0579 Rheumatoid arthritis with rheumatoid factor of multiple sites without organ or systems involvement: Secondary | ICD-10-CM | POA: Diagnosis not present

## 2016-12-12 DIAGNOSIS — M47812 Spondylosis without myelopathy or radiculopathy, cervical region: Secondary | ICD-10-CM | POA: Diagnosis not present

## 2016-12-12 DIAGNOSIS — I10 Essential (primary) hypertension: Secondary | ICD-10-CM | POA: Diagnosis not present

## 2016-12-12 DIAGNOSIS — M545 Low back pain: Secondary | ICD-10-CM | POA: Diagnosis not present

## 2016-12-13 ENCOUNTER — Other Ambulatory Visit: Payer: Self-pay | Admitting: *Deleted

## 2016-12-15 ENCOUNTER — Other Ambulatory Visit: Payer: Self-pay | Admitting: Family

## 2016-12-15 DIAGNOSIS — D649 Anemia, unspecified: Secondary | ICD-10-CM

## 2016-12-16 ENCOUNTER — Ambulatory Visit (HOSPITAL_BASED_OUTPATIENT_CLINIC_OR_DEPARTMENT_OTHER): Payer: Medicare Other | Admitting: Family

## 2016-12-16 ENCOUNTER — Other Ambulatory Visit (HOSPITAL_BASED_OUTPATIENT_CLINIC_OR_DEPARTMENT_OTHER): Payer: Medicare Other

## 2016-12-16 DIAGNOSIS — D500 Iron deficiency anemia secondary to blood loss (chronic): Secondary | ICD-10-CM | POA: Diagnosis not present

## 2016-12-16 DIAGNOSIS — D649 Anemia, unspecified: Secondary | ICD-10-CM | POA: Diagnosis not present

## 2016-12-16 LAB — COMPREHENSIVE METABOLIC PANEL (CC13)
ALT: 12 IU/L (ref 0–44)
AST (SGOT): 14 IU/L (ref 0–40)
Albumin, Serum: 4 g/dL (ref 3.6–4.8)
Albumin/Globulin Ratio: 1.4 (ref 1.2–2.2)
Alkaline Phosphatase, S: 86 IU/L (ref 39–117)
BUN/Creatinine Ratio: 19 (ref 10–24)
BUN: 19 mg/dL (ref 8–27)
Bilirubin Total: 0.3 mg/dL (ref 0.0–1.2)
Calcium, Ser: 9.3 mg/dL (ref 8.6–10.2)
Carbon Dioxide, Total: 29 mmol/L (ref 20–29)
Chloride, Ser: 92 mmol/L — ABNORMAL LOW (ref 96–106)
Creatinine, Ser: 0.98 mg/dL (ref 0.76–1.27)
GFR calc Af Amer: 91 mL/min/{1.73_m2} (ref >59)
GFR calc non Af Amer: 78 mL/min/{1.73_m2} (ref >59)
Globulin, Total: 2.9 g/dL (ref 1.5–4.5)
Glucose: 130 mg/dL — ABNORMAL HIGH (ref 65–99)
Potassium, Ser: 3.7 mmol/L (ref 3.5–5.2)
Sodium: 130 mmol/L — ABNORMAL LOW (ref 134–144)
Total Protein: 6.9 g/dL (ref 6.0–8.5)

## 2016-12-16 LAB — CBC WITH DIFFERENTIAL (CANCER CENTER ONLY)
BASO#: 0 10*3/uL (ref 0.0–0.2)
BASO%: 0.4 % (ref 0.0–2.0)
EOS%: 0.3 % (ref 0.0–7.0)
Eosinophils Absolute: 0 10*3/uL (ref 0.0–0.5)
HCT: 30.5 % — ABNORMAL LOW (ref 38.7–49.9)
HGB: 9.2 g/dL — ABNORMAL LOW (ref 13.0–17.1)
LYMPH#: 0.9 10*3/uL (ref 0.9–3.3)
LYMPH%: 12.8 % — ABNORMAL LOW (ref 14.0–48.0)
MCH: 24 pg — ABNORMAL LOW (ref 28.0–33.4)
MCHC: 30.2 g/dL — ABNORMAL LOW (ref 32.0–35.9)
MCV: 79 fL — ABNORMAL LOW (ref 82–98)
MONO#: 0.6 10*3/uL (ref 0.1–0.9)
MONO%: 8.4 % (ref 0.0–13.0)
NEUT#: 5.3 10*3/uL (ref 1.5–6.5)
NEUT%: 78.1 % (ref 40.0–80.0)
Platelets: 312 10*3/uL (ref 145–400)
RBC: 3.84 10*6/uL — ABNORMAL LOW (ref 4.20–5.70)
RDW: 16.7 % — ABNORMAL HIGH (ref 11.1–15.7)
WBC: 6.8 10*3/uL (ref 4.0–10.0)

## 2016-12-16 LAB — LACTATE DEHYDROGENASE: LDH: 150 U/L (ref 125–245)

## 2016-12-16 NOTE — Progress Notes (Signed)
Hematology/Oncology Consultation   Name: [name]      MRN: [id number]    Location: Room/bed info not found  Date: 12/16/2016 Time:4:47 PM   REFERRING PHYSICIAN: Lew Dawes, MD  REASON FOR CONSULT: Anemia due to chronic blood loss    DIAGNOSIS:  Hemochromatosis  Iron deficiency anemia  HISTORY OF PRESENT ILLNESS: [name] is a very pleasant 88 to caucasian gentleman with history of hemochromatosis (no DNA record available, retested today) and now iron deficiency anemia. His last phlebotomy was over 5 years ago.  Hgb is down to 9.2 with an MCV of 79. Iron saturation is 11% with a ferritin of 27. Hemochromatosis DNA is pending.  He is symptomatic with fatigue, weakness, chills, lightheadedness, SOB with exertion and "puffy" ankles.  He has tried taking an oral iron supplement for the last week or so and can not tell a difference.  He denies having any episodes of bleeding. He is on Plavix, no aspirin, and bruises easily.   He states that at one time he was having a colonoscopy every 2 years due to history of precancerous polyps. At his last colonoscopy, he was released for 5 years and is due again this month. He sees his PCP tomorrow and states that he plans to get a referral to GI to schedule his colonoscopy.  A rectal exam was performed by [name] today with myself as chaperone and hemoccult test results was negative.   No personal cancer history. Mother was a smoker and had lung cancer and father had prostate cancer.  No family history of cirrhosis or liver cancer. Maternal grandmother passed away in her early 48's from heart failure.  He denies fever, n/v, cough, rash, chest pain, palpitations or changes in bowel or bladder habits.  He has history of a ruptured appendix and Meckel's diverticulum. He has intermittent abdominal pain due to adhesions. He takes stool softeners twice a day and his BM's are regular.  He has chronic back and joint pain due to arthritis and  takes dilaudid as needed. He also has started using CBD oil and feels that this has helped a lot with his inflammation and pain.  He has maintained a good appetite and is staying well hydrated. He states that his weight is stable.   ROS: All other 10 point review of systems is negative.   PAST MEDICAL HISTORY:   Past Medical History:  Diagnosis Date  . Alcoholism /alcohol abuse ([place])    per family  . Allergic rhinitis   . Anxiety   . Bacterial infection   . CAD (coronary artery disease)    minimal coronary plaque in the LAD and right coronary system. 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 7-06. Last nuclear stress 11-17-06 fixed anterior/ inferior defect, no inducible ischemia, EF 81%  . Chronic back pain    "all over back"  . Chronic neck pain   . COPD (chronic obstructive pulmonary disease) ([place])   . Depression   . Diverticulosis   . Dysrhythmia 01-24-12   past hx. A.Fib x1 episode-responded to med.  . Falls frequently    "since 02/2013" (06/16/2013)  . GERD (gastroesophageal reflux disease)   . Hemochromatosis    dx'd 14 yrs ago last ferritin Aug 11, 08 52 (22-322), Fe 136 ("I had 250 phlebotomies for that")  . High cholesterol    hx  . Hx of colonic polyps   . Hx of colonoscopy   .  Hypertension   . Migraine    "for 3 months in 2015; related to infection in my back" (06/17/2014)  . Myocardial infarction [facility]) 2006   "related to catheterization"  . Narcotic abuse    per family  . Osteoarthritis   . PONV (postoperative nausea and vomiting)   . RA (rheumatoid arthritis) ([place])   . Sleep apnea    "never dx'd;" (06/17/2014))    ALLERGIES: Allergies  Allergen Reactions  . Cefepime Hives and Shortness Of Breath  . Morphine And Related Shortness Of Breath, Nausea And Vomiting, Swelling and Other (See Comments)    Agitation, tolerates dilaudid  . Penicillins Hives, Shortness Of Breath and Rash    Has patient  had a PCN reaction causing immediate rash, facial/tongue/throat swelling, SOB or lightheadedness with hypotension: Yes Has patient had a PCN reaction causing severe rash involving mucus membranes or skin necrosis: Yes Has patient had a PCN reaction that required hospitalization No Has patient had a PCN reaction occurring within the last 10 years: No If all of the above answers are "NO", then may proceed with Cephalosporin use.   [name] Percocet [Oxycodone-Acetaminophen] Anxiety    Takes OxyContin at home.      MEDICATIONS:  Current Outpatient Prescriptions on File Prior to Visit  Medication Sig Dispense Refill  . albuterol (PROVENTIL HFA;VENTOLIN HFA) 108 (90 Base) MCG/ACT inhaler Inhale 1-2 puffs into the lungs every 4 (four) hours as needed for wheezing or shortness of breath. 1 Inhaler 1  . alfuzosin (UROXATRAL) 10 MG 24 hr tablet Take 10 mg by mouth daily.  0  . ALPRAZolam (XANAX) 0.5 MG tablet Take 1 tablet (0.5 mg total) by mouth 2 (two) times daily as needed. for anxiety 60 tablet 2  . Cholecalciferol (VITAMIN D3) 2000 UNITS capsule Take 1 capsule (2,000 Units total) by mouth daily. 100 capsule 3  . clopidogrel (PLAVIX) 75 MG tablet TAKE 1 TABLET BY MOUTH EVERY DAY WITH BREAKFAST 90 tablet 3  . COLCRYS 0.6 MG tablet Take 1 tablet by mouth 2 (two) times daily.  2  . FLUoxetine (PROZAC) 40 MG capsule Take 1 capsule (40 mg total) by mouth 2 (two) times daily. 180 capsule 2  . fluticasone (FLONASE) 50 MCG/ACT nasal spray INSTILL 2 SPRAYS IN EACH NOSTRIL EVERY DAY 16 g 11  . gabapentin (NEURONTIN) 600 MG tablet Take 1 tablet (600 mg total) by mouth 3 (three) times daily. 90 tablet 5  . HYDROmorphone (DILAUDID) 2 MG tablet Take 1-2 tablets (2-4 mg total) by mouth 2 (two) times daily as needed for severe pain. Please fill on or after 01/26/17 120 tablet 0  . losartan-hydrochlorothiazide (HYZAAR) 50-12.5 MG tablet TAKE 1 TABLET BY MOUTH DAILY 90 tablet 0  . Melatonin 5 MG TABS Take 5 mg by mouth at  bedtime.    . metoprolol tartrate (LOPRESSOR) 25 MG tablet Take 1 tablet (25 mg total) by mouth 2 (two) times daily. 180 tablet 1  . Omega-3 Fatty Acids (FISH OIL OMEGA-3) 1000 MG CAPS Take 1,000-2,000 mg by mouth 2 (two) times daily. Take 2000 mg in the morning and 1000 mg at night    . ondansetron (ZOFRAN) 4 MG tablet Take 1 tablet (4 mg total) by mouth every 8 (eight) hours as needed for nausea or vomiting. 20 tablet 1  . pantoprazole (PROTONIX) 40 MG tablet Take 1 tablet (40 mg total) by mouth 2 (two) times daily. 180 tablet 1  . pravastatin (PRAVACHOL) 20 MG tablet Take 1 tablet (20 mg  total) by mouth daily. 90 tablet 1  . predniSONE (DELTASONE) 5 MG tablet take 57m daily  3   No current facility-administered medications on file prior to visit.      PAST SURGICAL HISTORY Past Surgical History:  Procedure Laterality Date  . ABDOMINAL ADHESION SURGERY  ~ 1968  . ANKLE RECONSTRUCTION Right 6-09   [facility]  . APPENDECTOMY  ~ 1956  . BONE TUMOR RESECTION  ~ 1954   "taken off my mastoid"  . CARPAL TUNNEL RELEASE Right 1990's  . CATARACT EXTRACTION, BILATERAL Bilateral 01-24-12  . CORONARY ANGIOPLASTY WITH STENT PLACEMENT  2006   "while repairing 1st stent, a second area tore and they had to place 2nd stent " ?LAD & CX  . CORONARY ANGIOPLASTY WITH STENT PLACEMENT  06/17/2014  . CYST EXCISION  "several OR's"   "backX 2, back of my neck, face, inside right bicept, chest, wrist"  . FOOT SURGERY Right 11-08   for removal of bone spurs-  . FRACTURE SURGERY    . HAMMER TOE SURGERY Right 07/2012   "broke 4 hammertoes"   . HARDWARE REMOVAL  03/09/2012   Procedure: HARDWARE REMOVAL;  Surgeon: JNita Sells MD;  [facility] [facility]  Service: Orthopedics;  Laterality: Left;  Hardware Removal from Left Shoulder  . HARVEST BONE GRAFT  02/06/2012   Procedure: HARVEST ILIAC BONE GRAFT;  Surgeon: JNita Sells MD;  Location: [facility];  Service: Orthopedics;;  bone  marrow aspirqation   . INGUINAL HERNIA REPAIR Bilateral   . JOINT REPLACEMENT    . KNEE SURGERY Left ~ 2003   "6-12 months after uni knee removed synovial sack"  . LEFT HEART CATHETERIZATION WITH CORONARY ANGIOGRAM N/A 06/17/2014   PCI of diffuse severe stenosis in the proximal to mid LAD using overlapping drug-eluting stents.  . LUMBAR WOUND DEBRIDEMENT N/A 03/17/2013   Procedure: Incision and drainage of superficial lumbar wound;  Surgeon: EFloyce Stakes MD;  [facility];  Service: Neurosurgery;  Laterality: N/A;  Incision and drainage of superficial lumbar wound  . MECKEL DIVERTICULUM EXCISION  ~ 1956  . ORIF SHOULDER FRACTURE  02/06/2012   Procedure: OPEN REDUCTION INTERNAL FIXATION (ORIF) SHOULDER FRACTURE;  Surgeon: JNita Sells MD;  Location: [facility];  Service: Orthopedics;  Laterality: Left;  ORIF of a Left Shoulder Fracture with  Iliac Crest Bone Graft aspiration   . POSTERIOR LUMBAR FUSION  12-10   L4-5 diskectomy w/ fusion, cage placement and rods; [place]  . POSTERIOR LUMBAR FUSION 4 LEVEL N/A 03/02/2013   Procedure: Lumbar One to Sacral One Posterior lumbar interbody fusion;  Surgeon: EFloyce Stakes MD;  [facility];  Service: Neurosurgery;  Laterality: N/A;  L1 to S1 Posterior lumbar interbody fusion  . REPLACEMENT UNICONDYLAR JOINT KNEE Left ~ 2003   "~ 6 months after total knee replaced"  . SHOULDER ARTHROSCOPY Left ~ 2004 X 2   "@ [facility]; left bone splinter in & had to clean it out"  . [facility] TOTAL KNEE ARTHROPLASTY Bilateral 2002  . TOTAL SHOULDER REPLACEMENT Left 2006  . TOTAL SHOULDER REPLACEMENT Right ~ 2007   Dr. DMarlou Sa   FAMILY HISTORY: Family History  Problem Relation Age of Onset  . Uterine cancer Mother        survivor  . Macular degeneration Mother   . Other Mother        ankle edema  .  Lung cancer Mother   . Cancer Mother        ovarian, lung  . Coronary artery disease  Father   . Hypertension Father   . Prostate cancer Father   . Colon polyps Father   . Cancer Father        prostate  . Heart attack Brother   . Hyperlipidemia Brother   . Other Brother        Schizophrenic  . Thyroid disease Sister   . Coronary artery disease Maternal Aunt   . Heart attack Maternal Aunt   . Rheum arthritis Sister   . Hemochromatosis Sister   . Diabetes Neg Hx   . Colon cancer Neg Hx   . Esophageal cancer Neg Hx   . Rectal cancer Neg Hx   . Stomach cancer Neg Hx     SOCIAL HISTORY:  reports that he has never smoked. He has never used smokeless tobacco. He reports that he does not drink alcohol or use drugs.  PERFORMANCE STATUS: The patient's performance status is 1 - Symptomatic but completely ambulatory  PHYSICAL EXAM: Most Recent Vital Signs: Blood pressure 106/63, pulse 67, temperature 97.8 F (36.6 C), temperature source Oral, weight 144 lb 12.8 oz (65.7 kg). BP 106/63 (BP Location: Right Arm, Patient Position: Sitting)   Pulse 67   Temp 97.8 F (36.6 C) (Oral)   Wt 144 lb 12.8 oz (65.7 kg)   BMI 23.37 kg/[name]   General Appearance:    Alert, cooperative, no distress, appears stated age  Head:    Normocephalic, without obvious abnormality, atraumatic  Eyes:    PERRL, conjunctiva/corneas clear, EOM's intact, fundi    benign, both eyes             Throat:   Lips, mucosa, and tongue normal; teeth and gums normal  Neck:   Supple, symmetrical, trachea midline, no adenopathy;       thyroid:  No enlargement/tenderness/nodules; no carotid   bruit or JVD  Back:     Symmetric, no curvature, ROM normal, no CVA tenderness  Lungs:     Clear to auscultation bilaterally, respirations unlabored  Chest wall:    No tenderness or deformity  Heart:    Regular rate and rhythm, S1 and S2 normal, no murmur, rub   or gallop  Abdomen:     Soft, non-tender, bowel sounds active all four quadrants,    no masses, no organomegaly        Extremities:   Extremities normal,  atraumatic, no cyanosis or edema  Pulses:   2+ and symmetric all extremities  Skin:   Skin color, texture, turgor normal, no rashes or lesions  Lymph nodes:   Cervical, supraclavicular, and axillary nodes normal  Neurologic:   CNII-XII intact. Normal strength, sensation and reflexes      throughout    LABORATORY DATA:  Results for orders placed or performed in visit on 12/16/16 (from the past 48 hour(s))  CBC w/Diff     Status: Abnormal   Collection Time: 12/16/16 12:56 PM  Result Value Ref Range   WBC 6.8 4.0 - 10.0 10e3/uL   RBC 3.84 (L) 4.20 - 5.70 10e6/uL   HGB 9.2 (L) 13.0 - 17.1 g/dL   HCT 30.5 (L) 38.7 - 49.9 %   MCV 79 (L) 82 - 98 fL   MCH 24.0 (L) 28.0 - 33.4 pg   MCHC 30.2 (L) 32.0 - 35.9 g/dL   RDW 16.7 (H) 11.1 - 15.7 %  Platelets 312 145 - 400 10e3/uL   NEUT# 5.3 1.5 - 6.5 10e3/uL   LYMPH# 0.9 0.9 - 3.3 10e3/uL   MONO# 0.6 0.1 - 0.9 10e3/uL   Eosinophils Absolute 0.0 0.0 - 0.5 10e3/uL   BASO# 0.0 0.0 - 0.2 10e3/uL   NEUT% 78.1 40.0 - 80.0 %   LYMPH% 12.8 (L) 14.0 - 48.0 %   MONO% 8.4 0.0 - 13.0 %   EOS% 0.3 0.0 - 7.0 %   BASO% 0.4 0.0 - 2.0 %  LDH     Status: None   Collection Time: 12/16/16 12:56 PM  Result Value Ref Range   LDH 150 125 - 245 U/L      RADIOGRAPHY: No results found.     PATHOLOGY: None  ASSESSMENT/PLAN: Mr. Nygard is a very pleasant 89 to caucasian gentleman with history of hemochromatosis (no DNA record available, retested today) and now iron deficiency anemia.  He will be scheduling his colonoscopy later this week. He has not noticed any obvious bleeding and his hemoccult test today was negative.  His iron studies are low. We will see what his Hemochromatosis DNA shows and determine if he required an IV iron infusion to help boost the Hgb.   All questions were answered. He is in agreement with the plan and will contact our office with any questions or concerns.  We will schedule his follow-up once we get all of his lab work back.    He was discussed with and also seen by [name] and he is in agreement with the aforementioned.   [name]      Addendum:  I saw and examined the patient with [name]. I Reuther above assessment.  He definitely is iron deficient. His ferritin is 27 with iron saturation of only 11%.  Even though he may have hemochromatosis, he is definitely iron deficient. I think with his anemia, he probably would benefit from a little bit of iron.  We talked to him about all of this. We spent about 40 minutes with him.  I looked at his blood under the microscope. I do not see any that looked suspicious. He had no nucleated red blood cells.  We'll see about giving him some iron. Again I think this probably would help him out. I think this will make him feel better.  I do agree with the colonoscopy. I think this would be a good idea.  We will plan to get him back in another 6 weeks. We will do the iron infusion in the next couple weeks.  We answered all his questions.  John Haw, MD

## 2016-12-17 ENCOUNTER — Ambulatory Visit (INDEPENDENT_AMBULATORY_CARE_PROVIDER_SITE_OTHER): Payer: Medicare Other | Admitting: Internal Medicine

## 2016-12-17 VITALS — BP 126/68 | HR 60 | Temp 98.2°F | Ht 66.0 in | Wt 146.0 lb

## 2016-12-17 DIAGNOSIS — Z23 Encounter for immunization: Secondary | ICD-10-CM | POA: Diagnosis not present

## 2016-12-17 DIAGNOSIS — M05711 Rheumatoid arthritis with rheumatoid factor of right shoulder without organ or systems involvement: Secondary | ICD-10-CM | POA: Diagnosis not present

## 2016-12-17 DIAGNOSIS — M961 Postlaminectomy syndrome, not elsewhere classified: Secondary | ICD-10-CM | POA: Diagnosis not present

## 2016-12-17 DIAGNOSIS — I2510 Atherosclerotic heart disease of native coronary artery without angina pectoris: Secondary | ICD-10-CM

## 2016-12-17 DIAGNOSIS — D500 Iron deficiency anemia secondary to blood loss (chronic): Secondary | ICD-10-CM

## 2016-12-17 LAB — FERRITIN: Ferritin: 27 ng/ml (ref 22–316)

## 2016-12-17 LAB — IRON AND TIBC
%SAT: 11 % — ABNORMAL LOW (ref 20–55)
Iron: 26 ug/dL — ABNORMAL LOW (ref 42–163)
TIBC: 245 ug/dL (ref 202–409)
UIBC: 219 ug/dL (ref 117–376)

## 2016-12-17 LAB — ERYTHROPOIETIN: Erythropoietin: 34.9 m[IU]/mL — ABNORMAL HIGH (ref 2.6–18.5)

## 2016-12-17 LAB — RETICULOCYTES: Reticulocyte Count: 0.9 % (ref 0.6–2.6)

## 2016-12-17 MED ORDER — ALPRAZOLAM 0.5 MG PO TABS: 0.5000 mg | ORAL_TABLET | Freq: Two times a day (BID) | ORAL | 2 refills | Status: DC | PRN

## 2016-12-17 NOTE — Assessment & Plan Note (Signed)
Predn 5 mg/d Better on Dilaudid  Potential benefits of a long term opioids use as well as potential risks (i.e. addiction risk, apnea etc) and complications (i.e. Somnolence, constipation and others) were explained to the patient and were aknowledged.

## 2016-12-17 NOTE — Progress Notes (Signed)
Subjective:  Patient ID: [name], male    DOB: 07/02/1945  Age: 71 y.o. MRN: [id number]  CC: No chief complaint on file.   HPI [name] presents for anemia, RA, anxiety, chronic pain f/u  Outpatient Medications Prior to Visit  Medication Sig Dispense Refill  . albuterol (PROVENTIL HFA;VENTOLIN HFA) 108 (90 Base) MCG/ACT inhaler Inhale 1-2 puffs into the lungs every 4 (four) hours as needed for wheezing or shortness of breath. 1 Inhaler 1  . alfuzosin (UROXATRAL) 10 MG 24 hr tablet Take 10 mg by mouth daily.  0  . ALPRAZolam (XANAX) 0.5 MG tablet Take 1 tablet (0.5 mg total) by mouth 2 (two) times daily as needed. for anxiety 60 tablet 2  . Cholecalciferol (VITAMIN D3) 2000 UNITS capsule Take 1 capsule (2,000 Units total) by mouth daily. 100 capsule 3  . clopidogrel (PLAVIX) 75 MG tablet TAKE 1 TABLET BY MOUTH EVERY DAY WITH BREAKFAST 90 tablet 3  . COLCRYS 0.6 MG tablet Take 1 tablet by mouth 2 (two) times daily.  2  . FLUoxetine (PROZAC) 40 MG capsule Take 1 capsule (40 mg total) by mouth 2 (two) times daily. 180 capsule 2  . fluticasone (FLONASE) 50 MCG/ACT nasal spray INSTILL 2 SPRAYS IN EACH NOSTRIL EVERY DAY 16 g 11  . gabapentin (NEURONTIN) 600 MG tablet Take 1 tablet (600 mg total) by mouth 3 (three) times daily. 90 tablet 5  . HYDROmorphone (DILAUDID) 2 MG tablet Take 1-2 tablets (2-4 mg total) by mouth 2 (two) times daily as needed for severe pain. Please fill on or after 01/26/17 120 tablet 0  . losartan-hydrochlorothiazide (HYZAAR) 50-12.5 MG tablet TAKE 1 TABLET BY MOUTH DAILY 90 tablet 0  . Melatonin 5 MG TABS Take 5 mg by mouth at bedtime.    . metoprolol tartrate (LOPRESSOR) 25 MG tablet Take 1 tablet (25 mg total) by mouth 2 (two) times daily. 180 tablet 1  . Omega-3 Fatty Acids (FISH OIL OMEGA-3) 1000 MG CAPS Take 1,000-2,000 mg by mouth 2 (two) times daily. Take 2000 mg in the morning and 1000 mg at night    . ondansetron (ZOFRAN) 4 MG tablet Take 1  tablet (4 mg total) by mouth every 8 (eight) hours as needed for nausea or vomiting. 20 tablet 1  . pantoprazole (PROTONIX) 40 MG tablet Take 1 tablet (40 mg total) by mouth 2 (two) times daily. 180 tablet 1  . pravastatin (PRAVACHOL) 20 MG tablet Take 1 tablet (20 mg total) by mouth daily. 90 tablet 1  . predniSONE (DELTASONE) 5 MG tablet take 73m daily  3   No facility-administered medications prior to visit.     ROS Review of Systems  Constitutional: Positive for fatigue. Negative for appetite change and unexpected weight change.  HENT: Negative for congestion, nosebleeds, sneezing, sore throat and trouble swallowing.   Eyes: Negative for itching and visual disturbance.  Respiratory: Negative for cough.   Cardiovascular: Negative for chest pain, palpitations and leg swelling.  Gastrointestinal: Negative for abdominal distention, blood in stool, diarrhea and nausea.  Genitourinary: Negative for frequency and hematuria.  Musculoskeletal: Positive for arthralgias, back pain and gait problem. Negative for joint swelling and neck pain.  Skin: Negative for rash.  Neurological: Positive for weakness. Negative for dizziness, tremors and speech difficulty.  Psychiatric/Behavioral: Negative for agitation, dysphoric mood, sleep disturbance and suicidal ideas. The patient is nervous/anxious.     Objective:  BP 126/68 (BP Location: Left Arm, Patient Position: Sitting, Cuff Size:  Normal)   Pulse 60   Temp 98.2 F (36.8 C) (Oral)   Ht 5' 6"  (1.676 m)   Wt 146 lb (66.2 kg)   SpO2 98%   BMI 23.57 kg/m   BP Readings from Last 3 Encounters:  12/17/16 126/68  12/16/16 106/63  11/26/16 128/76    Wt Readings from Last 3 Encounters:  12/17/16 146 lb (66.2 kg)  12/16/16 144 lb 12.8 oz (65.7 kg)  11/26/16 151 lb (68.5 kg)    Physical Exam  Constitutional: He is oriented to person, place, and time. He appears well-developed. No distress.  NAD  HENT:  Mouth/Throat: Oropharynx is clear and  moist.  Eyes: Pupils are equal, round, and reactive to light. Conjunctivae are normal.  Neck: Normal range of motion. No JVD present. No thyromegaly present.  Cardiovascular: Normal rate, regular rhythm, normal heart sounds and intact distal pulses.  Exam reveals no gallop and no friction rub.   No murmur heard. Pulmonary/Chest: Effort normal and breath sounds normal. No respiratory distress. He has no wheezes. He has no rales. He exhibits no tenderness.  Abdominal: Soft. Bowel sounds are normal. He exhibits no distension and no mass. There is no tenderness. There is no rebound and no guarding.  Musculoskeletal: Normal range of motion. He exhibits edema and tenderness.  Lymphadenopathy:    He has no cervical adenopathy.  Neurological: He is alert and oriented to person, place, and time. He has normal reflexes. No cranial nerve deficit. He exhibits normal muscle tone. He displays a negative Romberg sign. Coordination abnormal. Gait normal.  Skin: Skin is warm and dry. No rash noted.  Psychiatric: He has a normal mood and affect. His behavior is normal. Judgment and thought content normal.    Lab Results  Component Value Date   WBC 6.8 12/16/2016   HGB 9.2 (L) 12/16/2016   HCT 30.5 (L) 12/16/2016   PLT 312 12/16/2016   GLUCOSE 130 (H) 12/16/2016   CHOL 128 03/10/2015   TRIG 95.0 03/10/2015   HDL 39.30 03/10/2015   LDLCALC 70 03/10/2015   ALT 12 12/16/2016   AST 14 12/16/2016   NA 130 (L) 12/16/2016   K 3.7 12/16/2016   CL 92 (L) 12/16/2016   CREATININE 0.98 12/16/2016   BUN 19 12/16/2016   CO2 29 12/16/2016   TSH 1.10 09/28/2015   PSA 1.95 10/15/2010   INR 1.1 (H) 06/14/2014   HGBA1C 6.0 09/28/2015    Ct Head Wo Contrast  Result Date: 06/04/2016 CLINICAL DATA:  Pt slipped on a pile of magazines, causing him to fall back and hit his left head on a chair. Pt is on Plavix. Pt is alert and oriented; rt side neck pain started before the fall; EXAM: CT HEAD WITHOUT CONTRAST CT  CERVICAL SPINE WITHOUT CONTRAST TECHNIQUE: Multidetector CT imaging of the head and cervical spine was performed following the standard protocol without intravenous contrast. Multiplanar CT image reconstructions of the cervical spine were also generated. COMPARISON:  10/30/2013 FINDINGS: CT HEAD FINDINGS Brain: No intracranial hemorrhage, mass effect or midline shift. No acute cortical infarction. No mass lesion is noted on this unenhanced scan. Stable mild cerebral atrophy. Stable periventricular and patchy subcortical chronic white matter disease. Ventricular size is stable from prior exam. Vascular: Minimal atherosclerotic calcifications of carotid siphon. Skull: No skull fracture is noted. There is scalp swelling left posterior parietal region high convexity. Left scalp subcutaneous hematoma measures 3 cm length by 9 mm thickness Sinuses/Orbits: No paranasal sinuses air-fluid levels. Other:  None CT CERVICAL SPINE FINDINGS Comparison exam 05/06/2013 Alignment: No change in alignment from prior exam. Again noted chronic anterolisthesis about 3.4 mm C3 on C4 vertebral body. Stable chronic anterolisthesis about 3 mm C4 on C5 vertebral body. Stable about 4 mm anterolisthesis C7 on T1 vertebral body. Skull base and vertebrae: No acute fracture or subluxation. There is disc space flattening with mild anterior and mild posterior spurring at C2-C3 level. Stable mild disc space flattening at C3-C4 and C4-C5 level. Degenerative changes C1-C2 articulation. There is moderate disc space flattening with mild anterior and mild posterior spurring at C5-C6 and C6-C7 level. Moderate disc space flattening at C7-T1 level. Soft tissues and spinal canal: No prevertebral soft tissue swelling. Spinal canal is patent. Cervical airway is patent. Disc levels: Disc space flattening at C2-C3 level, C3-C4 level, C5-C6, C6-C7 and C7-T1 level. Upper chest: There is no pneumothorax in visualized lung apices. Multilevel facet degenerative changes  are noted. Other: None IMPRESSION: 1. No acute intracranial abnormality. Stable atrophy and chronic white matter disease. No definite acute cortical infarction. There is scalp swelling and small subcutaneous hematoma in left posterior parietal scalp region high convexity. Probable small skin laceration at this level. Clinical correlation is necessary. 2. No cervical spine acute fracture or subluxation. Again noted anterolisthesis at C3-C4, C4-C5 and C7-T1 level. Stable multilevel degenerative changes as described above. No prevertebral soft tissue swelling. Electronically Signed   By: Lahoma Crocker M.D.   On: 06/04/2016 09:17   Ct Cervical Spine Wo Contrast  Result Date: 06/04/2016 CLINICAL DATA:  Pt slipped on a pile of magazines, causing him to fall back and hit his left head on a chair. Pt is on Plavix. Pt is alert and oriented; rt side neck pain started before the fall; EXAM: CT HEAD WITHOUT CONTRAST CT CERVICAL SPINE WITHOUT CONTRAST TECHNIQUE: Multidetector CT imaging of the head and cervical spine was performed following the standard protocol without intravenous contrast. Multiplanar CT image reconstructions of the cervical spine were also generated. COMPARISON:  10/30/2013 FINDINGS: CT HEAD FINDINGS Brain: No intracranial hemorrhage, mass effect or midline shift. No acute cortical infarction. No mass lesion is noted on this unenhanced scan. Stable mild cerebral atrophy. Stable periventricular and patchy subcortical chronic white matter disease. Ventricular size is stable from prior exam. Vascular: Minimal atherosclerotic calcifications of carotid siphon. Skull: No skull fracture is noted. There is scalp swelling left posterior parietal region high convexity. Left scalp subcutaneous hematoma measures 3 cm length by 9 mm thickness Sinuses/Orbits: No paranasal sinuses air-fluid levels. Other: None CT CERVICAL SPINE FINDINGS Comparison exam 05/06/2013 Alignment: No change in alignment from prior exam. Again  noted chronic anterolisthesis about 3.4 mm C3 on C4 vertebral body. Stable chronic anterolisthesis about 3 mm C4 on C5 vertebral body. Stable about 4 mm anterolisthesis C7 on T1 vertebral body. Skull base and vertebrae: No acute fracture or subluxation. There is disc space flattening with mild anterior and mild posterior spurring at C2-C3 level. Stable mild disc space flattening at C3-C4 and C4-C5 level. Degenerative changes C1-C2 articulation. There is moderate disc space flattening with mild anterior and mild posterior spurring at C5-C6 and C6-C7 level. Moderate disc space flattening at C7-T1 level. Soft tissues and spinal canal: No prevertebral soft tissue swelling. Spinal canal is patent. Cervical airway is patent. Disc levels: Disc space flattening at C2-C3 level, C3-C4 level, C5-C6, C6-C7 and C7-T1 level. Upper chest: There is no pneumothorax in visualized lung apices. Multilevel facet degenerative changes are noted. Other: None IMPRESSION: 1.  No acute intracranial abnormality. Stable atrophy and chronic white matter disease. No definite acute cortical infarction. There is scalp swelling and small subcutaneous hematoma in left posterior parietal scalp region high convexity. Probable small skin laceration at this level. Clinical correlation is necessary. 2. No cervical spine acute fracture or subluxation. Again noted anterolisthesis at C3-C4, C4-C5 and C7-T1 level. Stable multilevel degenerative changes as described above. No prevertebral soft tissue swelling. Electronically Signed   By: Lahoma Crocker M.D.   On: 06/04/2016 09:17    Assessment & Plan:   There are no diagnoses linked to this encounter. I am having Mr. Holaday maintain his ondansetron, Vitamin D3, predniSONE, FISH OIL OMEGA-3, alfuzosin, Melatonin, COLCRYS, FLUoxetine, pantoprazole, pravastatin, clopidogrel, gabapentin, metoprolol tartrate, albuterol, ALPRAZolam, losartan-hydrochlorothiazide, HYDROmorphone, and fluticasone.  No orders of the  defined types were placed in this encounter.    Follow-up: No Follow-up on file.  [name], MD

## 2016-12-17 NOTE — Assessment & Plan Note (Signed)
Lopressor, Plavix, Pravachol

## 2016-12-17 NOTE — Assessment & Plan Note (Signed)
On pain meds

## 2016-12-17 NOTE — Assessment & Plan Note (Signed)
F/u w/Dr Marin Olp  Colonoscopy

## 2016-12-18 LAB — HEMOGLOBINOPATHY EVALUATION
HGB C: 0 %
HGB S: 0 %
HGB VARIANT: 0 %
Hemoglobin A2 Quantitation: 1.6 % — ABNORMAL LOW (ref 1.8–3.2)
Hemoglobin F Quantitation: 0 % (ref 0.0–2.0)
Hgb A: 98.4 % (ref 96.4–98.8)

## 2016-12-19 NOTE — Addendum Note (Signed)
Addended by: Karren Cobble on: 12/19/2016 04:47 PM   Modules accepted: Orders

## 2016-12-26 ENCOUNTER — Telehealth: Payer: Self-pay | Admitting: *Deleted

## 2016-12-26 NOTE — Telephone Encounter (Signed)
Patient calling for lab results.  Reviewed labs with Dr Marin Olp. Dr Marin Olp states all labs look okay, except for low iron levels. He would like patient to have one dose of IV feraheme.   Patient aware of results. Appointment scheduled.

## 2016-12-27 ENCOUNTER — Ambulatory Visit (HOSPITAL_BASED_OUTPATIENT_CLINIC_OR_DEPARTMENT_OTHER): Payer: Medicare Other

## 2016-12-27 VITALS — BP 107/47 | HR 76 | Temp 97.5°F | Resp 18

## 2016-12-27 DIAGNOSIS — D500 Iron deficiency anemia secondary to blood loss (chronic): Secondary | ICD-10-CM | POA: Diagnosis present

## 2016-12-27 HISTORY — DX: Iron deficiency anemia secondary to blood loss (chronic): D50.0

## 2016-12-27 MED ORDER — SODIUM CHLORIDE 0.9 % IV SOLN
510.0000 mg | Freq: Once | INTRAVENOUS | Status: AC
  Administered 2016-12-27: 510 mg via INTRAVENOUS
  Filled 2016-12-27: qty 17

## 2016-12-27 MED ORDER — SODIUM CHLORIDE 0.9 % IV SOLN
Freq: Once | INTRAVENOUS | Status: AC
Start: 2016-12-27 — End: 2016-12-27
  Administered 2016-12-27: 14:00:00 via INTRAVENOUS

## 2016-12-27 NOTE — Patient Instructions (Signed)

## 2017-01-07 ENCOUNTER — Other Ambulatory Visit: Payer: Self-pay | Admitting: Internal Medicine

## 2017-01-17 ENCOUNTER — Other Ambulatory Visit (HOSPITAL_BASED_OUTPATIENT_CLINIC_OR_DEPARTMENT_OTHER): Payer: Medicare Other

## 2017-01-17 ENCOUNTER — Other Ambulatory Visit: Payer: Self-pay | Admitting: Family

## 2017-01-17 ENCOUNTER — Ambulatory Visit (HOSPITAL_BASED_OUTPATIENT_CLINIC_OR_DEPARTMENT_OTHER): Payer: Medicare Other | Admitting: Family

## 2017-01-17 DIAGNOSIS — C4372 Malignant melanoma of left lower limb, including hip: Secondary | ICD-10-CM | POA: Diagnosis not present

## 2017-01-17 DIAGNOSIS — L821 Other seborrheic keratosis: Secondary | ICD-10-CM | POA: Diagnosis not present

## 2017-01-17 DIAGNOSIS — D500 Iron deficiency anemia secondary to blood loss (chronic): Secondary | ICD-10-CM

## 2017-01-17 DIAGNOSIS — D225 Melanocytic nevi of trunk: Secondary | ICD-10-CM | POA: Diagnosis not present

## 2017-01-17 DIAGNOSIS — C439 Malignant melanoma of skin, unspecified: Secondary | ICD-10-CM | POA: Diagnosis not present

## 2017-01-17 DIAGNOSIS — L853 Xerosis cutis: Secondary | ICD-10-CM | POA: Diagnosis not present

## 2017-01-17 DIAGNOSIS — D1801 Hemangioma of skin and subcutaneous tissue: Secondary | ICD-10-CM | POA: Diagnosis not present

## 2017-01-17 DIAGNOSIS — L814 Other melanin hyperpigmentation: Secondary | ICD-10-CM | POA: Diagnosis not present

## 2017-01-17 DIAGNOSIS — L570 Actinic keratosis: Secondary | ICD-10-CM | POA: Diagnosis not present

## 2017-01-17 DIAGNOSIS — D692 Other nonthrombocytopenic purpura: Secondary | ICD-10-CM | POA: Diagnosis not present

## 2017-01-17 DIAGNOSIS — L720 Epidermal cyst: Secondary | ICD-10-CM | POA: Diagnosis not present

## 2017-01-17 DIAGNOSIS — D485 Neoplasm of uncertain behavior of skin: Secondary | ICD-10-CM | POA: Diagnosis not present

## 2017-01-17 LAB — CBC WITH DIFFERENTIAL (CANCER CENTER ONLY)
BASO#: 0 10*3/uL (ref 0.0–0.2)
BASO%: 0.3 % (ref 0.0–2.0)
EOS%: 0.9 % (ref 0.0–7.0)
Eosinophils Absolute: 0.1 10*3/uL (ref 0.0–0.5)
HCT: 35.6 % — ABNORMAL LOW (ref 38.7–49.9)
HGB: 10.9 g/dL — ABNORMAL LOW (ref 13.0–17.1)
LYMPH#: 1.4 10*3/uL (ref 0.9–3.3)
LYMPH%: 20.2 % (ref 14.0–48.0)
MCH: 26.3 pg — ABNORMAL LOW (ref 28.0–33.4)
MCHC: 30.6 g/dL — ABNORMAL LOW (ref 32.0–35.9)
MCV: 86 fL (ref 82–98)
MONO#: 0.7 10*3/uL (ref 0.1–0.9)
MONO%: 10.6 % (ref 0.0–13.0)
NEUT#: 4.6 10*3/uL (ref 1.5–6.5)
NEUT%: 68 % (ref 40.0–80.0)
Platelets: 272 10*3/uL (ref 145–400)
RBC: 4.15 10*6/uL — ABNORMAL LOW (ref 4.20–5.70)
RDW: 21.7 % — ABNORMAL HIGH (ref 11.1–15.7)
WBC: 6.8 10*3/uL (ref 4.0–10.0)

## 2017-01-17 LAB — CMP (CANCER CENTER ONLY)
ALT(SGPT): 19 U/L (ref 10–47)
AST: 21 U/L (ref 11–38)
Albumin: 3.4 g/dL (ref 3.3–5.5)
Alkaline Phosphatase: 69 U/L (ref 26–84)
BUN, Bld: 15 mg/dL (ref 7–22)
CO2: 32 mEq/L (ref 18–33)
Calcium: 9.4 mg/dL (ref 8.0–10.3)
Chloride: 98 mEq/L (ref 98–108)
Creat: 0.9 mg/dl (ref 0.6–1.2)
Glucose, Bld: 143 mg/dL — ABNORMAL HIGH (ref 73–118)
Potassium: 4.1 mEq/L (ref 3.3–4.7)
Sodium: 139 mEq/L (ref 128–145)
Total Bilirubin: 0.6 mg/dl (ref 0.20–1.60)
Total Protein: 6.4 g/dL (ref 6.4–8.1)

## 2017-01-17 NOTE — Progress Notes (Signed)
Hematology and Oncology Follow Up Visit  TRAVANTI MCMANUS [id number] 10-18-1945 71 y.o. 01/17/2017   Principle Diagnosis:  Hemochromatosis  Iron deficiency anemia  Current Therapy:   IV iron as indicated    Interim History:  Mr. Whittenburg is here today with his wife for follow-up. He received one dose of IV iron in September and is still feeling fatigue, having chills, SOB with over exertion and dizziness.  His Hgb is now 10.9 with an MCV 86. Iron studies are pending. We also checked a hemochromatosis DNA today as well.  He has not noticed any obvious bleeding. He does bruise easily on Plavix.  He sees his GI doctor on November 9th and will then schedule his colonoscopy.  No fever, n/v, cough, rash, chest pain, palpitations, abdominal pain or changes in bowel or bladder habits.  No swelling, tenderness, numbness or tingling in his extremities. No c/o pain.  He has maintained a good appetite and is staying well hydrated. Her weight is stable.   ECOG Performance Status: 1 - Symptomatic but completely ambulatory  Medications:  Allergies as of 01/17/2017      Reactions   Cefepime Hives, Shortness Of Breath   Morphine And Related Shortness Of Breath, Nausea And Vomiting, Swelling, Other (See Comments)   Agitation, tolerates dilaudid   Penicillins Hives, Shortness Of Breath, Rash   Has patient had a PCN reaction causing immediate rash, facial/tongue/throat swelling, SOB or lightheadedness with hypotension: Yes Has patient had a PCN reaction causing severe rash involving mucus membranes or skin necrosis: Yes Has patient had a PCN reaction that required hospitalization No Has patient had a PCN reaction occurring within the last 10 years: No If all of the above answers are "NO", then may proceed with Cephalosporin use.   Percocet [oxycodone-acetaminophen] Anxiety   Takes OxyContin at home.      Medication List       Accurate as of 01/17/17  1:52 PM. Always use your most recent med  list.          albuterol 108 (90 Base) MCG/ACT inhaler Commonly known as:  PROVENTIL HFA;VENTOLIN HFA Inhale 1-2 puffs into the lungs every 4 (four) hours as needed for wheezing or shortness of breath.   alfuzosin 10 MG 24 hr tablet Commonly known as:  UROXATRAL Take 10 mg by mouth daily.   ALPRAZolam 0.5 MG tablet Commonly known as:  XANAX Take 1 tablet (0.5 mg total) by mouth 2 (two) times daily as needed. for anxiety   clopidogrel 75 MG tablet Commonly known as:  PLAVIX TAKE 1 TABLET BY MOUTH EVERY DAY WITH BREAKFAST   COLCRYS 0.6 MG tablet Generic drug:  colchicine Take 1 tablet by mouth 2 (two) times daily.   FISH OIL OMEGA-3 1000 MG Caps Take 1,000-2,000 mg by mouth 2 (two) times daily. Take 2000 mg in the morning and 1000 mg at night   FLUoxetine 40 MG capsule Commonly known as:  PROZAC Take 1 capsule (40 mg total) by mouth 2 (two) times daily.   FLUoxetine 40 MG capsule Commonly known as:  PROZAC TAKE 1 CAPSULE BY MOUTH TWICE DAILY   fluticasone 50 MCG/ACT nasal spray Commonly known as:  FLONASE INSTILL 2 SPRAYS IN EACH NOSTRIL EVERY DAY   gabapentin 600 MG tablet Commonly known as:  NEURONTIN Take 1 tablet (600 mg total) by mouth 3 (three) times daily.   HYDROmorphone 2 MG tablet Commonly known as:  DILAUDID Take 1-2 tablets (2-4 mg total) by mouth 2 (two) times  daily as needed for severe pain. Please fill on or after 01/26/17   losartan-hydrochlorothiazide 50-12.5 MG tablet Commonly known as:  HYZAAR TAKE 1 TABLET BY MOUTH DAILY   Melatonin 5 MG Tabs Take 5 mg by mouth at bedtime.   metoprolol tartrate 25 MG tablet Commonly known as:  LOPRESSOR Take 1 tablet (25 mg total) by mouth 2 (two) times daily.   ondansetron 4 MG tablet Commonly known as:  ZOFRAN Take 1 tablet (4 mg total) by mouth every 8 (eight) hours as needed for nausea or vomiting.   pantoprazole 40 MG tablet Commonly known as:  PROTONIX Take 1 tablet (40 mg total) by mouth 2  (two) times daily.   pravastatin 20 MG tablet Commonly known as:  PRAVACHOL Take 1 tablet (20 mg total) by mouth daily.   predniSONE 5 MG tablet Commonly known as:  DELTASONE take 68m daily   Vitamin D3 2000 units capsule Take 1 capsule (2,000 Units total) by mouth daily.       Allergies:  Allergies  Allergen Reactions  . Cefepime Hives and Shortness Of Breath  . Morphine And Related Shortness Of Breath, Nausea And Vomiting, Swelling and Other (See Comments)    Agitation, tolerates dilaudid  . Penicillins Hives, Shortness Of Breath and Rash    Has patient had a PCN reaction causing immediate rash, facial/tongue/throat swelling, SOB or lightheadedness with hypotension: Yes Has patient had a PCN reaction causing severe rash involving mucus membranes or skin necrosis: Yes Has patient had a PCN reaction that required hospitalization No Has patient had a PCN reaction occurring within the last 10 years: No If all of the above answers are "NO", then may proceed with Cephalosporin use.   .Marland KitchenPercocet [Oxycodone-Acetaminophen] Anxiety    Takes OxyContin at home.    Past Medical History, Surgical history, Social history, and Family History were reviewed and updated.  Review of Systems: All other 10 point review of systems is negative.   Physical Exam:  vitals were not taken for this visit.  Wt Readings from Last 3 Encounters:  12/17/16 146 lb (66.2 kg)  12/16/16 144 lb 12.8 oz (65.7 kg)  11/26/16 151 lb (68.5 kg)    Ocular: Sclerae unicteric, pupils equal, round and reactive to light Ear-nose-throat: Oropharynx clear, dentition fair Lymphatic: No cervical, supraclavicular or axillary adenopathy Lungs no rales or rhonchi, good excursion bilaterally Heart regular rate and rhythm, no murmur appreciated Abd soft, nontender, positive bowel sounds, no liver or spleen tip palpated on exam, no fluid wave MSK no focal spinal tenderness, no joint edema Neuro: non-focal, well-oriented,  appropriate affect Breasts: Deferred   Lab Results  Component Value Date   WBC 6.8 01/17/2017   HGB 10.9 (L) 01/17/2017   HCT 35.6 (L) 01/17/2017   MCV 86 01/17/2017   PLT 272 01/17/2017   Lab Results  Component Value Date   FERRITIN 27 12/16/2016   IRON 26 (L) 12/16/2016   TIBC 245 12/16/2016   UIBC 219 12/16/2016   IRONPCTSAT 11 (L) 12/16/2016   Lab Results  Component Value Date   RETICCTPCT 1.9 03/24/2013   RBC 4.15 (L) 01/17/2017   No results found for: KPAFRELGTCHN, LAMBDASER, KAPLAMBRATIO No results found for: IGGSERUM, IGA, IGMSERUM No results found for: TOTALPROTELP, ALBUMINELP, A1GS, A2GS, BETS, BETA2SER, GAMS, MSPIKE, SPEI   Chemistry      Component Value Date/Time   NA 130 (L) 12/16/2016 1256   K 3.7 12/16/2016 1256   CL 92 (L) 12/16/2016 1256  CO2 29 12/16/2016 1256   BUN 19 12/16/2016 1256   CREATININE 0.98 12/16/2016 1256      Component Value Date/Time   CALCIUM 9.3 12/16/2016 1256   ALKPHOS 86 12/16/2016 1256   AST 14 12/16/2016 1256   ALT 12 12/16/2016 1256   BILITOT 0.3 12/16/2016 1256      Impression and Plan: Mr. Rowe is a very pleasant 71 yo caucasian gentleman with history of hemochromatosis and now iron deficiency anemia secondary to possible GI blood loss. He will be scheduling his colonoscopy in November.  His Hgb has improved since his IV iron infusion last month to 10.9. He is still symptomatic with fatigue, SOB with over exertion, chills and dizziness.  We will see what his iron studies show and bring him back in next week for infusion if needed.  We will plan to see him back again in another 6 weeks for follow-up and lab. He will contact our office with any questions or concerns. We can certainly see him sooner if need be.   Eliezer Bottom, NP 10/12/20181:52 PM

## 2017-01-20 LAB — IRON AND TIBC
%SAT: 21 % (ref 20–55)
Iron: 45 ug/dL (ref 42–163)
TIBC: 211 ug/dL (ref 202–409)
UIBC: 167 ug/dL (ref 117–376)

## 2017-01-20 LAB — FERRITIN: Ferritin: 79 ng/ml (ref 22–316)

## 2017-01-21 ENCOUNTER — Telehealth: Payer: Self-pay | Admitting: *Deleted

## 2017-01-21 NOTE — Progress Notes (Signed)
Patient ID: [name], male   DOB: [birth year], 71 y.o.   MRN: [id number]    Cardiology Office Note    Date:  01/29/2017   ID:  [name], DOB 1946-03-19, MRN [id number]  PCP:  Cassandria Anger, MD  Cardiologist:  Dr. Jenkins Rouge   Electrophysiologist:  n/a  No chief complaint on file.   History of Present Illness:     71 y.o. male with a hx of CAD, paroxysmal atrial fibrillation not on systemic anticoagulation given need for DAPT, hyperlipidemia, COPD, GERD, obstructive sleep apnea, rheumatoid arthritis, and hemochromatosis. He had left circumflex stent with spiral dissection in 2006. He had TEE DCCV in May 2012. He had h/o post lumbar fusion surgical wound infection with positive blood culture of pseudomonas and required 6 weeks of IV antibiotics near the end of 2014. He was admitted in March 2016 with chest tightness and dyspnea and underwent cardiac catheterization which showed new stenosis in proximal to mid LAD treated with overlapping drug-eluting stents. He was started on aspirin and Plavix. Sees ortho at [facility] [place] Dr. Pasty Spillers.   He has skin cancer and needs more surgery on inside of his left knee    Past Medical History:  Diagnosis Date  . Alcoholism /alcohol abuse ([place])    per family  . Allergic rhinitis   . Anxiety   . Bacterial infection   . CAD (coronary artery disease)    minimal coronary plaque in the LAD and right coronary system. 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 7-06. Last nuclear stress 11-17-06 fixed anterior/ inferior defect, no inducible ischemia, EF 81%  . Chronic back pain    "all over back"  . Chronic neck pain   . COPD (chronic obstructive pulmonary disease) ([place])   . Depression   . Diverticulosis   . Dysrhythmia 01-24-12   past hx. A.Fib x1 episode-responded to med.  . Falls frequently    "since 02/2013" (06/16/2013)  . GERD (gastroesophageal reflux disease)     . Hemochromatosis    dx'd 14 yrs ago last ferritin Aug 11, 08 52 (22-322), Fe 136 ("I had 250 phlebotomies for that")  . High cholesterol    hx  . Hx of colonic polyps   . Hx of colonoscopy   . Hypertension   . Iron deficiency anemia due to chronic blood loss 12/27/2016  . Migraine    "for 3 months in 2015; related to infection in my back" (06/17/2014)  . Myocardial infarction [facility]) 2006   "related to catheterization"  . Narcotic abuse ([place])    per family  . Osteoarthritis   . PONV (postoperative nausea and vomiting)   . RA (rheumatoid arthritis) ([place])   . Sleep apnea    "never dx'd;" (06/17/2014))    Past Surgical History:  Procedure Laterality Date  . ABDOMINAL ADHESION SURGERY  ~ 1968  . ANKLE RECONSTRUCTION Right 6-09   [facility]  . APPENDECTOMY  ~ 1956  . BONE TUMOR RESECTION  ~ 1954   "taken off my mastoid"  . CARPAL TUNNEL RELEASE Right 1990's  . CATARACT EXTRACTION, BILATERAL Bilateral 01-24-12  . CORONARY ANGIOPLASTY WITH STENT PLACEMENT  2006   "while repairing 1st stent, a second area tore and they had to place 2nd stent " ?LAD & CX  . CORONARY ANGIOPLASTY WITH STENT PLACEMENT  06/17/2014  . CYST EXCISION  "several OR's"   "backX 2, back of my neck, face, inside right  bicept, chest, wrist"  . FOOT SURGERY Right 11-08   for removal of bone spurs-  . FRACTURE SURGERY    . HAMMER TOE SURGERY Right 07/2012   "broke 4 hammertoes"   . HARDWARE REMOVAL  03/09/2012   Procedure: HARDWARE REMOVAL;  Surgeon: Nita Sells, MD;  Location: [place];  Service: Orthopedics;  Laterality: Left;  Hardware Removal from Left Shoulder  . HARVEST BONE GRAFT  02/06/2012   Procedure: HARVEST ILIAC BONE GRAFT;  Surgeon: Nita Sells, MD;  Location: [facility];  Service: Orthopedics;;  bone marrow aspirqation   . INGUINAL HERNIA REPAIR Bilateral   . JOINT REPLACEMENT    . KNEE SURGERY Left ~ 2003   "6-12 months after uni knee removed synovial sack"  . LEFT  HEART CATHETERIZATION WITH CORONARY ANGIOGRAM N/A 06/17/2014   PCI of diffuse severe stenosis in the proximal to mid LAD using overlapping drug-eluting stents.  . LUMBAR WOUND DEBRIDEMENT N/A 03/17/2013   Procedure: Incision and drainage of superficial lumbar wound;  Surgeon: Floyce Stakes, MD;  Location: [facility];  Service: Neurosurgery;  Laterality: N/A;  Incision and drainage of superficial lumbar wound  . MECKEL DIVERTICULUM EXCISION  ~ 1956  . ORIF SHOULDER FRACTURE  02/06/2012   Procedure: OPEN REDUCTION INTERNAL FIXATION (ORIF) SHOULDER FRACTURE;  Surgeon: Nita Sells, MD;  Location: [facility];  Service: Orthopedics;  Laterality: Left;  ORIF of a Left Shoulder Fracture with  Iliac Crest Bone Graft aspiration   . POSTERIOR LUMBAR FUSION  12-10   L4-5 diskectomy w/ fusion, cage placement and rods; [place]  . POSTERIOR LUMBAR FUSION 4 LEVEL N/A 03/02/2013   Procedure: Lumbar One to Sacral One Posterior lumbar interbody fusion;  Surgeon: Floyce Stakes, MD;  Location: [facility];  Service: Neurosurgery;  Laterality: N/A;  L1 to S1 Posterior lumbar interbody fusion  . REPLACEMENT UNICONDYLAR JOINT KNEE Left ~ 2003   "~ 6 months after total knee replaced"  . SHOULDER ARTHROSCOPY Left ~ 2004 X 2   "@ [facility]; left bone splinter in & had to clean it out"  . TOTAL ANKLE REPLACEMENT Right 2008   at [facility]  . TOTAL KNEE ARTHROPLASTY Bilateral 2002  . TOTAL SHOULDER REPLACEMENT Left 2006  . TOTAL SHOULDER REPLACEMENT Right ~ 2007   Dr. Marlou Sa    Current Outpatient Prescriptions  Medication Sig Dispense Refill  . albuterol (PROVENTIL HFA;VENTOLIN HFA) 108 (90 Base) MCG/ACT inhaler Inhale 1-2 puffs into the lungs every 4 (four) hours as needed for wheezing or shortness of breath. 1 Inhaler 1  . alfuzosin (UROXATRAL) 10 MG 24 hr tablet Take 10 mg by mouth daily.  0  . ALPRAZolam (XANAX) 0.5 MG tablet Take 1 tablet (0.5 mg total) by mouth 2 (two) times daily as needed. for anxiety 60  tablet 2  . Cholecalciferol (VITAMIN D3) 2000 UNITS capsule Take 1 capsule (2,000 Units total) by mouth daily. 100 capsule 3  . clopidogrel (PLAVIX) 75 MG tablet TAKE 1 TABLET BY MOUTH EVERY DAY WITH BREAKFAST 90 tablet 3  . COLCRYS 0.6 MG tablet Take 1 tablet by mouth 2 (two) times daily.  2  . FLUoxetine (PROZAC) 40 MG capsule Take 1 capsule (40 mg total) by mouth 2 (two) times daily. 180 capsule 2  . FLUoxetine (PROZAC) 40 MG capsule TAKE 1 CAPSULE BY MOUTH TWICE DAILY 180 capsule 1  . fluticasone (FLONASE) 50 MCG/ACT nasal spray INSTILL 2 SPRAYS IN EACH NOSTRIL EVERY DAY 16 g  11  . gabapentin (NEURONTIN) 300 MG capsule TK 1 C PO QAM AND 2 CS PO QPM  1  . HYDROmorphone (DILAUDID) 2 MG tablet Take 1-2 tablets (2-4 mg total) by mouth 2 (two) times daily as needed for severe pain. Please fill on or after 01/26/17 120 tablet 0  . losartan-hydrochlorothiazide (HYZAAR) 50-12.5 MG tablet TAKE 1 TABLET BY MOUTH DAILY 90 tablet 0  . Melatonin 5 MG TABS Take 5 mg by mouth at bedtime.    . metoprolol tartrate (LOPRESSOR) 25 MG tablet Take 1 tablet (25 mg total) by mouth 2 (two) times daily. 180 tablet 1  . Omega-3 Fatty Acids (FISH OIL OMEGA-3) 1000 MG CAPS Take 1,000-2,000 mg by mouth 2 (two) times daily. Take 2000 mg in the morning and 1000 mg at night    . ondansetron (ZOFRAN) 4 MG tablet Take 1 tablet (4 mg total) by mouth every 8 (eight) hours as needed for nausea or vomiting. 20 tablet 1  . pantoprazole (PROTONIX) 40 MG tablet Take 1 tablet (40 mg total) by mouth 2 (two) times daily. 180 tablet 1  . pravastatin (PRAVACHOL) 20 MG tablet Take 1 tablet (20 mg total) by mouth daily. 90 tablet 1  . predniSONE (DELTASONE) 5 MG tablet take 39m daily  3  . tadalafil (CIALIS) 5 MG tablet Take 5 mg by mouth as needed.     No current facility-administered medications for this visit.     Allergies:   Cefepime; Morphine and related; Penicillins; and Percocet [oxycodone-acetaminophen]   Social History    Social History  . Marital status: Divorced    Spouse name: N/A  . Number of children: 2  . Years of education: 16   Occupational History  . 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 Smoking status: Never Smoker  . Smokeless tobacco: Never Used  . Alcohol use No     Comment: "I do not drink anymore"  . Drug use: No  . Sexual activity: Yes    Partners: Female   Other Topics Concern  . None   Social History Narrative   [employer] - BA. Married 1972-89yrseperated/divorced. Engaged April '11. 1 son '74; step-daughter '70. 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 During '11 discovered an [place] loss. Is handling this OK - will keep the business running.     Family History:  The patient's family history includes Cancer in his father and mother; Colon polyps in his father; Coronary artery disease in his father and maternal aunt; Heart attack in his brother and maternal aunt; Hemochromatosis in his sister; Hyperlipidemia in his brother; Hypertension in his father; Lung cancer in his mother; Macular degeneration in his mother; Other in his brother and mother; Prostate cancer in his father; Rheum arthritis in his sister; Thyroid disease in his sister; Uterine cancer in his mother.   ROS:   Please see the history of present illness.    ROS All other systems reviewed and are negative.   PHYSICAL EXAM:   VS:  BP (!) 142/64   Pulse 60    Affect appropriate Healthy:  appears stated age HE95normal Neck supple with no adenopathy JVP normal no bruits no thyromegaly Lungs clear with no wheezing and good diaphragmatic motion Heart:  S1/S2 no murmur, no rub, gallop or click PMI normal Abdomen: benighn, BS  positve, no tenderness, no AAA no bruit.  No HSM or HJR Distal pulses intact with no bruits No edema Neuro  non-focal Skin warm and dry No muscular weakness Eschar over medial aspect of left knee from skin cancer   Wt Readings from Last 3 Encounters:  01/17/17 146 lb (66.2 kg)  12/17/16 146 lb (66.2 kg)  12/16/16 144 lb 12.8 oz (65.7 kg)      Studies/Labs Reviewed:   EKG:  03/28/15   It showed NSR rate 68  Normal  01/29/17 SR rate 60 [id number]   Recent Labs: 01/17/2017: ALT(SGPT) 19; BUN, Bld 15; Creat 0.9; HGB 10.9; Platelets 272; Potassium 4.1; Sodium 139   Recent Lipid Panel    Component Value Date/Time   CHOL 128 03/10/2015 1008   TRIG 95.0 03/10/2015 1008   HDL 39.30 03/10/2015 1008   CHOLHDL 3 03/10/2015 1008   VLDL 19.0 03/10/2015 1008   [place] 70 03/10/2015 1008    Additional studies/ records that were reviewed today include:   LHC 06/17/14 Coronary angiography: Coronary dominance: right Left mainstem: Normal Left anterior descending (LAD): 80% proximal lesion before D1 50% just distal to D1 80% distal D1 30% multiple discrete Left circumflex (LCx): Primarily AV groove and OM1 Widely patent stents in OM Right coronary artery (RCA): Dominant 30% proximal 50% Mid vessel stenosis  Left ventriculography: Left ventricular systolic function is normal, LVEF is estimated at 55-65%, there is no significant mitral regurgitation  Final Conclusions: New LAD lesions patent stents in OM Recommendations: Reviewed with Dr Burt Knack PCI/Stent mulitple sights in LAD  Conclusions: Successful PCI of diffuse severe stenosis in the proximal to mid LAD using overlapping drug-eluting stent platforms  Recommendations: Dual antiplatelet therapy with aspirin and Plavix for at least 12 months.   Lesion Data: Vessel: LAD/proximal to mid Percent stenosis (pre): 80 TIMI-flow (pre): 3 Stent: 2.5 x 24 mm Promus DES and 3.0 x 20 mm Promus DES (overlapping) Percent stenosis (post): 0 TIMI-flow (post): 3  Myoview 05/20/14 Low risk stress nuclear study Poor quality scan with marked gut  uptake and diaphragmatic motion on RAW images. Cannot r/o inferolateral wall ischemia.  LV Ejection Fraction: 63%. LV Wall Motion: NL LV Function; NL Wall Motion   ASSESSMENT:    CAD   PLAN:  In order of problems listed above:  1. CAD s/p overlapping DESx2 to LAD on 06/17/2014 Previous stenting circumflex 2006 with spiral dissection : no angina, Continue plavix Since not taking ASA P2Y has been good in past   2. COPD: stable no active wheezing   3. Paroxysmal atrial fibrillation not on systemic anticoagulation given need for DAPT  - s/p TEE DCCV in May 2012, previously on pradaxa, off pradaxa now given need for Plavix  4. Hyperlipidemia: LFT and lipid panel stable on 03/10/2015. Continue pravachol  5. OSA:  Wearing CPAP   6. Rheumatoid arthritis right foot surgery ok at this time if needed Ortho typically does not Stop his plavix   7. Skin cancer ok to have moews type surgery for skin cancer    Jenkins Rouge

## 2017-01-21 NOTE — Telephone Encounter (Addendum)
Patient is aware of results  ----- Message from Eliezer Bottom, NP sent at 01/20/2017  1:45 PM EDT ----- Regarding: Iron  Iron studies came up nicely. No infusion needed at this time! Thank you!  Sarah  ----- Message ----- From: Interface, Lab In Three Zero One Sent: 01/17/2017   1:42 PM To: Eliezer Bottom, NP

## 2017-01-23 LAB — HEMOCHROMATOSIS DNA-PCR(C282Y,H63D)

## 2017-01-29 ENCOUNTER — Ambulatory Visit (INDEPENDENT_AMBULATORY_CARE_PROVIDER_SITE_OTHER): Payer: Medicare Other | Admitting: Cardiovascular Disease

## 2017-01-29 ENCOUNTER — Encounter: Payer: Self-pay | Admitting: Cardiovascular Disease

## 2017-01-29 VITALS — BP 142/64 | HR 60

## 2017-01-29 DIAGNOSIS — I10 Essential (primary) hypertension: Secondary | ICD-10-CM

## 2017-01-29 DIAGNOSIS — I2510 Atherosclerotic heart disease of native coronary artery without angina pectoris: Secondary | ICD-10-CM | POA: Diagnosis not present

## 2017-01-29 NOTE — Patient Instructions (Signed)

## 2017-01-30 DIAGNOSIS — D3132 Benign neoplasm of left choroid: Secondary | ICD-10-CM | POA: Diagnosis not present

## 2017-01-30 DIAGNOSIS — H35042 Retinal micro-aneurysms, unspecified, left eye: Secondary | ICD-10-CM | POA: Diagnosis not present

## 2017-01-30 DIAGNOSIS — Z961 Presence of intraocular lens: Secondary | ICD-10-CM | POA: Diagnosis not present

## 2017-01-30 DIAGNOSIS — H43813 Vitreous degeneration, bilateral: Secondary | ICD-10-CM | POA: Diagnosis not present

## 2017-01-31 DIAGNOSIS — M25542 Pain in joints of left hand: Secondary | ICD-10-CM | POA: Diagnosis not present

## 2017-01-31 DIAGNOSIS — M25541 Pain in joints of right hand: Secondary | ICD-10-CM | POA: Diagnosis not present

## 2017-02-03 ENCOUNTER — Other Ambulatory Visit: Payer: Self-pay | Admitting: General Surgery

## 2017-02-03 DIAGNOSIS — C4372 Malignant melanoma of left lower limb, including hip: Secondary | ICD-10-CM

## 2017-02-03 DIAGNOSIS — M25541 Pain in joints of right hand: Secondary | ICD-10-CM | POA: Diagnosis not present

## 2017-02-03 DIAGNOSIS — M25542 Pain in joints of left hand: Secondary | ICD-10-CM | POA: Diagnosis not present

## 2017-02-04 ENCOUNTER — Telehealth: Payer: Self-pay | Admitting: Internal Medicine

## 2017-02-04 NOTE — Telephone Encounter (Signed)
OK to fill this/these prescription(s) with additional refills x2 Needs to have an OV every 3 months Thank you!

## 2017-02-04 NOTE — Telephone Encounter (Signed)
Pt called requesting a refill on ALPRAZolam (XANAX) 0.5 MG tablet.

## 2017-02-05 DIAGNOSIS — M25642 Stiffness of left hand, not elsewhere classified: Secondary | ICD-10-CM | POA: Diagnosis not present

## 2017-02-05 DIAGNOSIS — M25542 Pain in joints of left hand: Secondary | ICD-10-CM | POA: Diagnosis not present

## 2017-02-05 DIAGNOSIS — M25541 Pain in joints of right hand: Secondary | ICD-10-CM | POA: Diagnosis not present

## 2017-02-05 DIAGNOSIS — M25641 Stiffness of right hand, not elsewhere classified: Secondary | ICD-10-CM | POA: Diagnosis not present

## 2017-02-05 MED ORDER — ALPRAZOLAM 0.5 MG PO TABS: 0.5000 mg | ORAL_TABLET | Freq: Two times a day (BID) | ORAL | 2 refills | Status: DC | PRN

## 2017-02-05 NOTE — Telephone Encounter (Signed)
Called pt no answer LMOM rx called [facility]...[name]

## 2017-02-06 ENCOUNTER — Other Ambulatory Visit: Payer: Self-pay

## 2017-02-06 DIAGNOSIS — M0579 Rheumatoid arthritis with rheumatoid factor of multiple sites without organ or systems involvement: Secondary | ICD-10-CM | POA: Diagnosis not present

## 2017-02-06 DIAGNOSIS — R0600 Dyspnea, unspecified: Secondary | ICD-10-CM | POA: Diagnosis not present

## 2017-02-06 DIAGNOSIS — M25531 Pain in right wrist: Secondary | ICD-10-CM | POA: Diagnosis not present

## 2017-02-06 DIAGNOSIS — M150 Primary generalized (osteo)arthritis: Secondary | ICD-10-CM | POA: Diagnosis not present

## 2017-02-06 DIAGNOSIS — M5136 Other intervertebral disc degeneration, lumbar region: Secondary | ICD-10-CM | POA: Diagnosis not present

## 2017-02-06 DIAGNOSIS — M1120 Other chondrocalcinosis, unspecified site: Secondary | ICD-10-CM | POA: Diagnosis not present

## 2017-02-06 DIAGNOSIS — C4372 Malignant melanoma of left lower limb, including hip: Secondary | ICD-10-CM | POA: Diagnosis not present

## 2017-02-06 DIAGNOSIS — Z6824 Body mass index (BMI) 24.0-24.9, adult: Secondary | ICD-10-CM | POA: Diagnosis not present

## 2017-02-06 DIAGNOSIS — D6489 Other specified anemias: Secondary | ICD-10-CM | POA: Diagnosis not present

## 2017-02-06 MED ORDER — PANTOPRAZOLE SODIUM 40 MG PO TBEC: 40.0000 mg | DELAYED_RELEASE_TABLET | Freq: Every evening | ORAL | 1 refills | Status: DC

## 2017-02-06 NOTE — Pre-Procedure Instructions (Addendum)
NICHALAS COIN  02/06/2017      [facility] [place], [place] [address] Phone: [phone] Fax: [phone]    Your procedure is scheduled on Wednesday November 7.  Report to [facility] Admitting at 1:00 P.M.  Call this number if you have problems the morning of surgery:  [phone]   Remember:  Do not eat food or drink liquids after midnight.  **DRINK BOOST BREEZE drink 2 hours prior to arrival time (11:00 AM)**   Take these medicines the morning of surgery with A SIP OF WATER:  Metoprolol (lopressor) Prednisone (deltasone) Ondansetron (zofran) if needed Fluoxetine (prozac) Gabapentin (neurontin) Hydromorphone (dilaudid) if needed Alprazolam (Xanax) if needed Albuterol if needed (please bring inhaler to hospital with you)  7 days prior to surgery STOP taking any Aleve, Naproxen, Ibuprofen, Motrin, Advil, Goody's, BC's, all herbal medications, fish oil, and all vitamins  FOLLOW MD's instructions on stopping Plavix (clopidogrel)    Do not wear jewelry, make-up or nail polish.  Do not wear lotions, powders, or perfumes, or deoderant.  Do not shave 48 hours prior to surgery.  Men may shave face and neck.  Do not bring valuables to the hospital.  [facility] is not responsible for any belongings or valuables.  Contacts, dentures or bridgework may not be worn into surgery.  Leave your suitcase in the car.  After surgery it may be brought to your room.  For patients admitted to the hospital, discharge time will be determined by your treatment team.  Patients discharged the day of surgery will not be allowed to drive home.    Special instructions:    [place]- Preparing For Surgery  Before surgery, you can play an important role. Because skin is not sterile, your skin needs to be as free of germs as possible. You can reduce the number of  germs on your skin by washing with CHG (chlorahexidine gluconate) Soap before surgery.  CHG is an antiseptic cleaner which kills germs and bonds with the skin to continue killing germs even after washing.  Please do not use if you have an allergy to CHG or antibacterial soaps. If your skin becomes reddened/irritated stop using the CHG.  Do not shave (including legs and underarms) for at least 48 hours prior to first CHG shower. It is OK to shave your face.  Please follow these instructions carefully.   1. Shower the NIGHT BEFORE SURGERY and the MORNING OF SURGERY with CHG.   2. If you chose to wash your hair, wash your hair first as usual with your normal shampoo.  3. After you shampoo, rinse your hair and body thoroughly to remove the shampoo.  4. Use CHG as you would any other liquid soap. You can apply CHG directly to the skin and wash gently with a scrungie or a clean washcloth.   5. Apply the CHG Soap to your body ONLY FROM THE NECK DOWN.  Do not use on open wounds or open sores. Avoid contact with your eyes, ears, mouth and genitals (private parts). Wash Face and genitals (private parts)  with your normal soap.  6. Wash thoroughly, paying special attention to the area where your surgery will be performed.  7. Thoroughly rinse your body with warm water from the neck down.  8. DO NOT shower/wash with your normal soap after using and rinsing off the  CHG Soap.  9. Pat yourself dry with a CLEAN TOWEL.  10. Wear CLEAN PAJAMAS to bed the night before surgery, wear comfortable clothes the morning of surgery  11. Place CLEAN SHEETS on your bed the night of your first shower and DO NOT SLEEP WITH PETS.    Day of Surgery: Do not apply any deodorants/lotions. Please wear clean clothes to the hospital/surgery center.      Please read over the following fact sheets that you were given. Coughing and Deep Breathing and Surgical Site Infection Prevention

## 2017-02-07 ENCOUNTER — Encounter (HOSPITAL_COMMUNITY): Payer: Self-pay

## 2017-02-07 ENCOUNTER — Encounter (HOSPITAL_COMMUNITY)
Admission: RE | Admit: 2017-02-07 | Discharge: 2017-02-07 | Disposition: A | Discharge status: Home / Self Care | Payer: Medicare Other | Source: Ambulatory Visit | Attending: General Surgery | Admitting: General Surgery

## 2017-02-07 DIAGNOSIS — M25542 Pain in joints of left hand: Secondary | ICD-10-CM | POA: Diagnosis not present

## 2017-02-07 DIAGNOSIS — M25641 Stiffness of right hand, not elsewhere classified: Secondary | ICD-10-CM | POA: Diagnosis not present

## 2017-02-07 DIAGNOSIS — C4372 Malignant melanoma of left lower limb, including hip: Secondary | ICD-10-CM | POA: Diagnosis not present

## 2017-02-07 DIAGNOSIS — Z01812 Encounter for preprocedural laboratory examination: Secondary | ICD-10-CM | POA: Insufficient documentation

## 2017-02-07 DIAGNOSIS — M25541 Pain in joints of right hand: Secondary | ICD-10-CM | POA: Diagnosis not present

## 2017-02-07 DIAGNOSIS — M25642 Stiffness of left hand, not elsewhere classified: Secondary | ICD-10-CM | POA: Diagnosis not present

## 2017-02-07 HISTORY — DX: Dyspnea, unspecified: R06.00

## 2017-02-07 LAB — CBC WITH DIFFERENTIAL/PLATELET
Basophils Absolute: 0 K/uL (ref 0.0–0.1)
Basophils Relative: 0 %
Eosinophils Absolute: 0 K/uL (ref 0.0–0.7)
Eosinophils Relative: 0 %
HCT: 36.7 % — ABNORMAL LOW (ref 39.0–52.0)
Hemoglobin: 11.5 g/dL — ABNORMAL LOW (ref 13.0–17.0)
Lymphocytes Relative: 13 %
Lymphs Abs: 0.9 K/uL (ref 0.7–4.0)
MCH: 26.7 pg (ref 26.0–34.0)
MCHC: 31.3 g/dL (ref 30.0–36.0)
MCV: 85.3 fL (ref 78.0–100.0)
Monocytes Absolute: 0.6 K/uL (ref 0.1–1.0)
Monocytes Relative: 8 %
Neutro Abs: 5.3 K/uL (ref 1.7–7.7)
Neutrophils Relative %: 79 %
Platelets: 212 K/uL (ref 150–400)
RBC: 4.3 MIL/uL (ref 4.22–5.81)
RDW: 19.6 % — ABNORMAL HIGH (ref 11.5–15.5)
WBC: 6.7 K/uL (ref 4.0–10.5)

## 2017-02-07 LAB — COMPREHENSIVE METABOLIC PANEL
ALT: 15 U/L — ABNORMAL LOW (ref 17–63)
AST: 20 U/L (ref 15–41)
Albumin: 3.7 g/dL (ref 3.5–5.0)
Alkaline Phosphatase: 73 U/L (ref 38–126)
Anion gap: 6 (ref 5–15)
BUN: 17 mg/dL (ref 6–20)
CO2: 28 mmol/L (ref 22–32)
Calcium: 8.9 mg/dL (ref 8.9–10.3)
Chloride: 95 mmol/L — ABNORMAL LOW (ref 101–111)
Creatinine, Ser: 0.93 mg/dL (ref 0.61–1.24)
GFR calc Af Amer: >60 mL/min (ref >60)
GFR calc non Af Amer: >60 mL/min (ref >60)
Glucose, Bld: 110 mg/dL — ABNORMAL HIGH (ref 65–99)
Potassium: 4 mmol/L (ref 3.5–5.1)
Sodium: 129 mmol/L — ABNORMAL LOW (ref 135–145)
Total Bilirubin: 0.6 mg/dL (ref 0.3–1.2)
Total Protein: 6.4 g/dL — ABNORMAL LOW (ref 6.5–8.1)

## 2017-02-07 LAB — LACTATE DEHYDROGENASE: LDH: 137 U/L (ref 98–192)

## 2017-02-07 NOTE — Pre-Procedure Instructions (Signed)
[name]  02/07/2017      [facility] [address], [place] [address] Phone: [phone] Fax: [phone]    Your procedure is scheduled on 02-12-2017  Wednesday   Report to [facility] Admitting at 1:00 PM   Call this number if you have problems the morning of surgery:  [phone]   Remember:  Do not eat food or drink liquids after midnight.   Take these medicines the morning of surgery with A SIP OF WATER Ventolin inhaler if needed,alprazolam(Xanax),Fluoxetine(Prozac),Flonase nasal spray,gabapentin(Neurontin),Hydromorphnoe if needed,ondansetron(Zofran) if needed,  STOP ASPIRIN,ANTIINFLAMATORIES (IBUPROFEN,ALEVE,MOTRIN,ADVIL,GOODY'S POWDERS),HERBAL SUPPLEMENTS,FISH OIL,AND VITAMINS 5-7 DAYS PRIOR TO SURGERY   Do not wear jewelry, .  Do not wear lotions, powders, or perfumes, or deoderant.  Do not shave 48 hours prior to surgery.  Men may shave face and neck.  Do not bring valuables to the hospital.   [facility] is not responsible for any belongings or valuables.  Contacts, dentures or bridgework may not be worn into surgery.  Leave your suitcase in the car.  After surgery it may be brought to your room.  For patients admitted to the hospital, discharge time will be determined by your treatment team.  Patients discharged the day of surgery will not be allowed to drive home.   Special Instructions: [place] - Preparing for Surgery  Before surgery, you can play an important role.  Because skin is not sterile, your skin needs to be as free of germs as possible.  You can reduce the number of germs on you skin by washing with CHG (chlorahexidine gluconate) soap before surgery.  CHG is an antiseptic cleaner which kills germs and bonds with the skin to continue killing germs even after washing.  Please DO NOT use if you have an allergy to CHG or  antibacterial soaps.  If your skin becomes reddened/irritated stop using the CHG and inform your nurse when you arrive at Short Stay.  Do not shave (including legs and underarms) for at least 48 hours prior to the first CHG shower.  You may shave your face.  Please follow these instructions carefully:   1.  Shower with CHG Soap the night before surgery and the   morning of Surgery.  2.  If you choose to wash your hair, wash your hair first as usual with your normal shampoo.  3.  After you shampoo, rinse your hair and body thoroughly to remove the  Shampoo.  4.  Use CHG as you would any other liquid soap.  You can apply chg directly  to the skin and wash gently with scrungie or a clean washcloth.  5.  Apply the CHG Soap to your body ONLY FROM THE NECK DOWN.   Do not use on open wounds or open sores.  Avoid contact with your eyes,  ears, mouth and genitals (private parts).  Wash genitals (private parts) with your normal soap.  6.  Wash thoroughly, paying special attention to the area where your surgery will be performed.  7.  Thoroughly rinse your body with warm water from the neck down.  8.  DO NOT shower/wash with your normal soap after using and rinsing o  the CHG Soap.  9.  Pat yourself dry with a clean towel.            10.  Wear clean pajamas.  11.  Place clean sheets on your bed the night of your first shower and do not sleep with pets.  Day of Surgery  Do not apply any lotions/deodorants the morning of surgery.  Please wear clean clothes to the hospital/surgery center.   Please read over the following fact sheets that you were given. Surgical Site Infection Prevention

## 2017-02-10 ENCOUNTER — Encounter: Payer: Self-pay | Admitting: Gastroenterology

## 2017-02-10 DIAGNOSIS — M25541 Pain in joints of right hand: Secondary | ICD-10-CM | POA: Diagnosis not present

## 2017-02-10 DIAGNOSIS — M25542 Pain in joints of left hand: Secondary | ICD-10-CM | POA: Diagnosis not present

## 2017-02-10 DIAGNOSIS — M25641 Stiffness of right hand, not elsewhere classified: Secondary | ICD-10-CM | POA: Diagnosis not present

## 2017-02-10 DIAGNOSIS — M25642 Stiffness of left hand, not elsewhere classified: Secondary | ICD-10-CM | POA: Diagnosis not present

## 2017-02-11 ENCOUNTER — Encounter: Payer: Self-pay | Admitting: Hematology & Oncology

## 2017-02-12 ENCOUNTER — Encounter (HOSPITAL_COMMUNITY)
Admission: RE | Disposition: A | Discharge status: Home / Self Care | Payer: Self-pay | Source: Ambulatory Visit | Attending: General Surgery

## 2017-02-12 ENCOUNTER — Other Ambulatory Visit (HOSPITAL_COMMUNITY)
Admission: RE | Admit: 2017-02-12 | Discharge: 2017-02-12 | Disposition: A | Discharge status: Home / Self Care | Payer: Medicare Other | Source: Ambulatory Visit | Attending: Hematology & Oncology | Admitting: Hematology & Oncology

## 2017-02-12 ENCOUNTER — Ambulatory Visit (HOSPITAL_COMMUNITY): Payer: Medicare Other | Admitting: Certified Registered Nurse Anesthetist

## 2017-02-12 ENCOUNTER — Ambulatory Visit (HOSPITAL_COMMUNITY)
Admission: RE | Admit: 2017-02-12 | Discharge: 2017-02-13 | Disposition: A | Discharge status: Home / Self Care | Payer: Medicare Other | Source: Ambulatory Visit | Attending: General Surgery | Admitting: General Surgery

## 2017-02-12 ENCOUNTER — Encounter (HOSPITAL_COMMUNITY)
Admission: RE | Admit: 2017-02-12 | Discharge: 2017-02-12 | Disposition: A | Discharge status: Home / Self Care | Payer: Medicare Other | Source: Ambulatory Visit | Attending: General Surgery | Admitting: General Surgery

## 2017-02-12 ENCOUNTER — Other Ambulatory Visit: Payer: Self-pay

## 2017-02-12 ENCOUNTER — Encounter (HOSPITAL_COMMUNITY): Payer: Self-pay | Admitting: Orthopedic Surgery

## 2017-02-12 DIAGNOSIS — I10 Essential (primary) hypertension: Secondary | ICD-10-CM | POA: Insufficient documentation

## 2017-02-12 DIAGNOSIS — F418 Other specified anxiety disorders: Secondary | ICD-10-CM | POA: Insufficient documentation

## 2017-02-12 DIAGNOSIS — K219 Gastro-esophageal reflux disease without esophagitis: Secondary | ICD-10-CM | POA: Diagnosis not present

## 2017-02-12 DIAGNOSIS — J449 Chronic obstructive pulmonary disease, unspecified: Secondary | ICD-10-CM | POA: Diagnosis not present

## 2017-02-12 DIAGNOSIS — Z79899 Other long term (current) drug therapy: Secondary | ICD-10-CM | POA: Diagnosis not present

## 2017-02-12 DIAGNOSIS — C4372 Malignant melanoma of left lower limb, including hip: Secondary | ICD-10-CM

## 2017-02-12 DIAGNOSIS — L988 Other specified disorders of the skin and subcutaneous tissue: Secondary | ICD-10-CM | POA: Diagnosis not present

## 2017-02-12 DIAGNOSIS — I4891 Unspecified atrial fibrillation: Secondary | ICD-10-CM | POA: Diagnosis not present

## 2017-02-12 DIAGNOSIS — E7800 Pure hypercholesterolemia, unspecified: Secondary | ICD-10-CM | POA: Diagnosis not present

## 2017-02-12 DIAGNOSIS — C774 Secondary and unspecified malignant neoplasm of inguinal and lower limb lymph nodes: Secondary | ICD-10-CM | POA: Insufficient documentation

## 2017-02-12 DIAGNOSIS — F419 Anxiety disorder, unspecified: Secondary | ICD-10-CM | POA: Diagnosis not present

## 2017-02-12 HISTORY — PX: MELANOMA EXCISION WITH SENTINEL LYMPH NODE BIOPSY: SHX5267

## 2017-02-12 HISTORY — DX: Malignant melanoma of left lower limb, including hip: C43.72

## 2017-02-12 LAB — CBC
HCT: 35 % — ABNORMAL LOW (ref 39.0–52.0)
Hemoglobin: 11.1 g/dL — ABNORMAL LOW (ref 13.0–17.0)
MCH: 27.3 pg (ref 26.0–34.0)
MCHC: 31.7 g/dL (ref 30.0–36.0)
MCV: 86.2 fL (ref 78.0–100.0)
Platelets: 209 10*3/uL (ref 150–400)
RBC: 4.06 MIL/uL — ABNORMAL LOW (ref 4.22–5.81)
RDW: 19.6 % — ABNORMAL HIGH (ref 11.5–15.5)
WBC: 5.5 10*3/uL (ref 4.0–10.5)

## 2017-02-12 LAB — CREATININE, SERUM
Creatinine, Ser: 0.9 mg/dL (ref 0.61–1.24)
GFR calc Af Amer: >60 mL/min (ref >60)
GFR calc non Af Amer: >60 mL/min (ref >60)

## 2017-02-12 SURGERY — MELANOMA EXCISION WITH SENTINEL LYMPH NODE BIOPSY
Anesthesia: General | Site: Knee | Laterality: Left

## 2017-02-12 MED ORDER — OXYCODONE HCL 5 MG PO TABS: 5.0000 mg | ORAL_TABLET | Freq: Once | ORAL | Status: DC | PRN

## 2017-02-12 MED ORDER — ACETAMINOPHEN 500 MG PO TABS
ORAL_TABLET | ORAL | Status: AC
  Administered 2017-02-12: 1000 mg via ORAL
  Filled 2017-02-12: qty 2

## 2017-02-12 MED ORDER — MIDAZOLAM HCL 5 MG/5ML IJ SOLN
INTRAMUSCULAR | Status: DC | PRN
  Administered 2017-02-12: 2 mg via INTRAVENOUS

## 2017-02-12 MED ORDER — METHYLENE BLUE 0.5 % INJ SOLN
INTRAVENOUS | Status: AC
  Filled 2017-02-12: qty 10

## 2017-02-12 MED ORDER — LOSARTAN POTASSIUM-HCTZ 50-12.5 MG PO TABS: 1.0000 | ORAL_TABLET | Freq: Every day | ORAL | Status: DC

## 2017-02-12 MED ORDER — SCOPOLAMINE 1 MG/3DAYS TD PT72
MEDICATED_PATCH | TRANSDERMAL | Status: AC
  Filled 2017-02-12: qty 2

## 2017-02-12 MED ORDER — PANTOPRAZOLE SODIUM 40 MG PO TBEC: 40.0000 mg | DELAYED_RELEASE_TABLET | Freq: Every evening | ORAL | Status: DC

## 2017-02-12 MED ORDER — CIPROFLOXACIN IN D5W 400 MG/200ML IV SOLN
400.0000 mg | INTRAVENOUS | Status: AC
  Administered 2017-02-12: 400 mg via INTRAVENOUS

## 2017-02-12 MED ORDER — LACTATED RINGERS IV SOLN
INTRAVENOUS | Status: DC
Start: 2017-02-12 — End: 2017-02-12
  Administered 2017-02-12 (×2): via INTRAVENOUS

## 2017-02-12 MED ORDER — ALFUZOSIN HCL ER 10 MG PO TB24
10.0000 mg | ORAL_TABLET | Freq: Every day | ORAL | Status: DC
  Administered 2017-02-12: 10 mg via ORAL
  Filled 2017-02-12: qty 1

## 2017-02-12 MED ORDER — CHLORHEXIDINE GLUCONATE CLOTH 2 % EX PADS: 6.0000 | MEDICATED_PAD | Freq: Once | CUTANEOUS | Status: DC

## 2017-02-12 MED ORDER — PREDNISONE 5 MG PO TABS
5.0000 mg | ORAL_TABLET | Freq: Every day | ORAL | Status: DC
  Administered 2017-02-13: 5 mg via ORAL
  Filled 2017-02-12: qty 1

## 2017-02-12 MED ORDER — PROPOFOL 10 MG/ML IV BOLUS
INTRAVENOUS | Status: DC | PRN
  Administered 2017-02-12: 200 mg via INTRAVENOUS

## 2017-02-12 MED ORDER — DEXAMETHASONE SODIUM PHOSPHATE 10 MG/ML IJ SOLN
INTRAMUSCULAR | Status: DC | PRN
  Administered 2017-02-12: 5 mg via INTRAVENOUS

## 2017-02-12 MED ORDER — ONDANSETRON HCL 4 MG/2ML IJ SOLN
INTRAMUSCULAR | Status: AC
  Filled 2017-02-12: qty 4

## 2017-02-12 MED ORDER — ONDANSETRON HCL 4 MG/2ML IJ SOLN
INTRAMUSCULAR | Status: DC | PRN
  Administered 2017-02-12: 4 mg via INTRAVENOUS

## 2017-02-12 MED ORDER — ONDANSETRON HCL 4 MG/2ML IJ SOLN: 4.0000 mg | Freq: Four times a day (QID) | INTRAMUSCULAR | Status: DC | PRN

## 2017-02-12 MED ORDER — 0.9 % SODIUM CHLORIDE (POUR BTL) OPTIME
TOPICAL | Status: DC | PRN
  Administered 2017-02-12: 1000 mL

## 2017-02-12 MED ORDER — LOSARTAN POTASSIUM 50 MG PO TABS
50.0000 mg | ORAL_TABLET | Freq: Every day | ORAL | Status: DC
  Administered 2017-02-13: 50 mg via ORAL
  Filled 2017-02-12: qty 1

## 2017-02-12 MED ORDER — MELATONIN 5 MG PO TABS
5.0000 mg | ORAL_TABLET | Freq: Every day | ORAL | Status: DC
  Filled 2017-02-12: qty 1

## 2017-02-12 MED ORDER — GABAPENTIN 300 MG PO CAPS
300.0000 mg | ORAL_CAPSULE | ORAL | Status: AC
  Administered 2017-02-12: 300 mg via ORAL

## 2017-02-12 MED ORDER — HYDROMORPHONE HCL 2 MG PO TABS
2.0000 mg | ORAL_TABLET | Freq: Four times a day (QID) | ORAL | Status: DC | PRN
  Administered 2017-02-13: 4 mg via ORAL
  Administered 2017-02-13: 2 mg via ORAL
  Filled 2017-02-12: qty 1
  Filled 2017-02-12: qty 2

## 2017-02-12 MED ORDER — FLUOXETINE HCL 20 MG PO CAPS
40.0000 mg | ORAL_CAPSULE | Freq: Two times a day (BID) | ORAL | Status: DC
  Administered 2017-02-12 – 2017-02-13 (×2): 40 mg via ORAL
  Filled 2017-02-12 (×2): qty 2

## 2017-02-12 MED ORDER — LIDOCAINE 2% (20 MG/ML) 5 ML SYRINGE
INTRAMUSCULAR | Status: DC | PRN
  Administered 2017-02-12: 60 mg via INTRAVENOUS

## 2017-02-12 MED ORDER — TECHNETIUM TC 99M SULFUR COLLOID FILTERED
0.5000 | Freq: Once | INTRAVENOUS | Status: AC | PRN
  Administered 2017-02-12: 0.5 via INTRADERMAL

## 2017-02-12 MED ORDER — DIPHENHYDRAMINE HCL 50 MG/ML IJ SOLN: 12.5000 mg | Freq: Four times a day (QID) | INTRAMUSCULAR | Status: DC | PRN

## 2017-02-12 MED ORDER — DIPHENHYDRAMINE HCL 12.5 MG/5ML PO ELIX: 12.5000 mg | ORAL_SOLUTION | Freq: Four times a day (QID) | ORAL | Status: DC | PRN

## 2017-02-12 MED ORDER — KCL IN DEXTROSE-NACL 20-5-0.45 MEQ/L-%-% IV SOLN
INTRAVENOUS | Status: AC
  Administered 2017-02-12: 21:00:00 via INTRAVENOUS
  Filled 2017-02-12 (×2): qty 1000

## 2017-02-12 MED ORDER — COLCHICINE 0.6 MG PO TABS
0.6000 mg | ORAL_TABLET | Freq: Every evening | ORAL | Status: DC
  Administered 2017-02-12: 0.6 mg via ORAL
  Filled 2017-02-12: qty 1

## 2017-02-12 MED ORDER — ROCURONIUM BROMIDE 10 MG/ML (PF) SYRINGE
PREFILLED_SYRINGE | INTRAVENOUS | Status: AC
  Filled 2017-02-12: qty 5

## 2017-02-12 MED ORDER — MELATONIN 3 MG PO TABS
6.0000 mg | ORAL_TABLET | Freq: Every day | ORAL | Status: DC
  Administered 2017-02-12: 6 mg via ORAL
  Filled 2017-02-12: qty 2

## 2017-02-12 MED ORDER — PROPOFOL 10 MG/ML IV BOLUS
INTRAVENOUS | Status: AC
  Filled 2017-02-12: qty 20

## 2017-02-12 MED ORDER — METOPROLOL TARTRATE 25 MG PO TABS
25.0000 mg | ORAL_TABLET | Freq: Two times a day (BID) | ORAL | Status: DC
  Administered 2017-02-12 – 2017-02-13 (×2): 25 mg via ORAL
  Filled 2017-02-12 (×2): qty 1

## 2017-02-12 MED ORDER — GABAPENTIN 300 MG PO CAPS: 300.0000 mg | ORAL_CAPSULE | Freq: Two times a day (BID) | ORAL | Status: DC

## 2017-02-12 MED ORDER — ACETAMINOPHEN 500 MG PO TABS
1000.0000 mg | ORAL_TABLET | Freq: Four times a day (QID) | ORAL | Status: DC
  Administered 2017-02-12 – 2017-02-13 (×3): 1000 mg via ORAL
  Filled 2017-02-12 (×4): qty 2

## 2017-02-12 MED ORDER — PRAVASTATIN SODIUM 10 MG PO TABS
20.0000 mg | ORAL_TABLET | Freq: Every evening | ORAL | Status: DC
  Administered 2017-02-12: 20 mg via ORAL
  Filled 2017-02-12: qty 2

## 2017-02-12 MED ORDER — PROMETHAZINE HCL 25 MG/ML IJ SOLN: 6.2500 mg | INTRAMUSCULAR | Status: DC | PRN

## 2017-02-12 MED ORDER — CIPROFLOXACIN IN D5W 400 MG/200ML IV SOLN
400.0000 mg | Freq: Two times a day (BID) | INTRAVENOUS | Status: AC
  Administered 2017-02-13: 400 mg via INTRAVENOUS
  Filled 2017-02-12: qty 200

## 2017-02-12 MED ORDER — FLUTICASONE PROPIONATE 50 MCG/ACT NA SUSP
1.0000 | Freq: Every day | NASAL | Status: DC
  Filled 2017-02-12: qty 16

## 2017-02-12 MED ORDER — HEPARIN SODIUM (PORCINE) 5000 UNIT/ML IJ SOLN
5000.0000 [IU] | Freq: Three times a day (TID) | INTRAMUSCULAR | Status: DC
  Administered 2017-02-13: 5000 [IU] via SUBCUTANEOUS
  Filled 2017-02-12: qty 1

## 2017-02-12 MED ORDER — ONDANSETRON 4 MG PO TBDP: 4.0000 mg | ORAL_TABLET | Freq: Four times a day (QID) | ORAL | Status: DC | PRN

## 2017-02-12 MED ORDER — DEXAMETHASONE SODIUM PHOSPHATE 10 MG/ML IJ SOLN
INTRAMUSCULAR | Status: AC
  Filled 2017-02-12: qty 2

## 2017-02-12 MED ORDER — EPHEDRINE 5 MG/ML INJ
INTRAVENOUS | Status: AC
  Filled 2017-02-12: qty 10

## 2017-02-12 MED ORDER — METOPROLOL TARTRATE 5 MG/5ML IV SOLN: 5.0000 mg | Freq: Four times a day (QID) | INTRAVENOUS | Status: DC | PRN

## 2017-02-12 MED ORDER — LIDOCAINE HCL 1 % IJ SOLN
INTRAMUSCULAR | Status: DC | PRN
  Administered 2017-02-12: 19 mL

## 2017-02-12 MED ORDER — SENNA 8.6 MG PO TABS
1.0000 | ORAL_TABLET | Freq: Two times a day (BID) | ORAL | Status: DC
  Administered 2017-02-12 – 2017-02-13 (×2): 8.6 mg via ORAL
  Filled 2017-02-12 (×2): qty 1

## 2017-02-12 MED ORDER — FENTANYL CITRATE (PF) 250 MCG/5ML IJ SOLN
INTRAMUSCULAR | Status: AC
  Filled 2017-02-12: qty 5

## 2017-02-12 MED ORDER — ALPRAZOLAM 0.5 MG PO TABS
0.5000 mg | ORAL_TABLET | Freq: Two times a day (BID) | ORAL | Status: DC | PRN
  Administered 2017-02-12: 0.5 mg via ORAL
  Filled 2017-02-12: qty 1

## 2017-02-12 MED ORDER — LIDOCAINE HCL 1 % IJ SOLN
INTRAMUSCULAR | Status: AC
  Filled 2017-02-12: qty 20

## 2017-02-12 MED ORDER — CIPROFLOXACIN IN D5W 400 MG/200ML IV SOLN
INTRAVENOUS | Status: AC
  Filled 2017-02-12: qty 200

## 2017-02-12 MED ORDER — GABAPENTIN 300 MG PO CAPS
ORAL_CAPSULE | ORAL | Status: AC
  Administered 2017-02-12: 300 mg via ORAL
  Filled 2017-02-12: qty 1

## 2017-02-12 MED ORDER — GABAPENTIN 300 MG PO CAPS
300.0000 mg | ORAL_CAPSULE | Freq: Two times a day (BID) | ORAL | Status: DC
  Administered 2017-02-12 – 2017-02-13 (×2): 300 mg via ORAL
  Filled 2017-02-12 (×2): qty 1

## 2017-02-12 MED ORDER — ONDANSETRON HCL 4 MG PO TABS: 4.0000 mg | ORAL_TABLET | Freq: Three times a day (TID) | ORAL | Status: DC | PRN

## 2017-02-12 MED ORDER — MIDAZOLAM HCL 2 MG/2ML IJ SOLN
INTRAMUSCULAR | Status: AC
  Filled 2017-02-12: qty 2

## 2017-02-12 MED ORDER — ACETAMINOPHEN 500 MG PO TABS
1000.0000 mg | ORAL_TABLET | ORAL | Status: AC
  Administered 2017-02-12: 1000 mg via ORAL

## 2017-02-12 MED ORDER — EPHEDRINE SULFATE 50 MG/ML IJ SOLN
INTRAMUSCULAR | Status: DC | PRN
  Administered 2017-02-12: 10 mg via INTRAVENOUS
  Administered 2017-02-12 (×2): 15 mg via INTRAVENOUS
  Administered 2017-02-12: 10 mg via INTRAVENOUS

## 2017-02-12 MED ORDER — FENTANYL CITRATE (PF) 100 MCG/2ML IJ SOLN
INTRAMUSCULAR | Status: DC | PRN
  Administered 2017-02-12: 50 ug via INTRAVENOUS
  Administered 2017-02-12 (×2): 25 ug via INTRAVENOUS
  Administered 2017-02-12: 50 ug via INTRAVENOUS

## 2017-02-12 MED ORDER — BUPIVACAINE-EPINEPHRINE (PF) 0.25% -1:200000 IJ SOLN
INTRAMUSCULAR | Status: AC
  Filled 2017-02-12: qty 30

## 2017-02-12 MED ORDER — MEPERIDINE HCL 25 MG/ML IJ SOLN: 6.2500 mg | INTRAMUSCULAR | Status: DC | PRN

## 2017-02-12 MED ORDER — OXYCODONE HCL 5 MG/5ML PO SOLN: 5.0000 mg | Freq: Once | ORAL | Status: DC | PRN

## 2017-02-12 MED ORDER — METHYLENE BLUE 1 % INJ SOLN
INTRAMUSCULAR | Status: DC | PRN
Start: 2017-02-12 — End: 2017-02-12
  Administered 2017-02-12: 2 mL via SUBMUCOSAL

## 2017-02-12 MED ORDER — HYDROCHLOROTHIAZIDE 12.5 MG PO CAPS
12.5000 mg | ORAL_CAPSULE | Freq: Every day | ORAL | Status: DC
  Administered 2017-02-13: 12.5 mg via ORAL
  Filled 2017-02-12: qty 1

## 2017-02-12 MED ORDER — ALBUTEROL SULFATE (2.5 MG/3ML) 0.083% IN NEBU: 3.0000 mL | INHALATION_SOLUTION | RESPIRATORY_TRACT | Status: DC | PRN

## 2017-02-12 MED ORDER — HYDROMORPHONE HCL 1 MG/ML IJ SOLN
0.5000 mg | INTRAMUSCULAR | Status: DC | PRN
  Administered 2017-02-12 – 2017-02-13 (×3): 1 mg via INTRAVENOUS
  Filled 2017-02-12 (×3): qty 1

## 2017-02-12 MED ORDER — HYDROMORPHONE HCL 1 MG/ML IJ SOLN: 0.2500 mg | INTRAMUSCULAR | Status: DC | PRN

## 2017-02-12 SURGICAL SUPPLY — 68 items
ADH SKN CLS APL DERMABOND .7 (GAUZE/BANDAGES/DRESSINGS) ×1
APL SKNCLS STERI-STRIP NONHPOA (GAUZE/BANDAGES/DRESSINGS) ×1
BENZOIN TINCTURE PRP APPL 2/3 (GAUZE/BANDAGES/DRESSINGS) ×2 IMPLANT
BNDG COHESIVE 4X5 TAN STRL (GAUZE/BANDAGES/DRESSINGS) IMPLANT
BNDG COHESIVE 6X5 TAN STRL LF (GAUZE/BANDAGES/DRESSINGS) ×3 IMPLANT
BNDG GAUZE ELAST 4 BULKY (GAUZE/BANDAGES/DRESSINGS) ×3 IMPLANT
CANISTER SUCT 3000ML PPV (MISCELLANEOUS) ×3 IMPLANT
CHLORAPREP W/TINT 26ML (MISCELLANEOUS) ×3 IMPLANT
CLIP VESOCCLUDE MED 24/CT (CLIP) ×3 IMPLANT
CLIP VESOCCLUDE SM WIDE 24/CT (CLIP) ×3 IMPLANT
CLOSURE WOUND 1/2 X4 (GAUZE/BANDAGES/DRESSINGS) ×1
CONT SPEC 4OZ CLIKSEAL STRL BL (MISCELLANEOUS) ×11 IMPLANT
COVER MAYO STAND STRL (DRAPES) IMPLANT
COVER PROBE W GEL 5X96 (DRAPES) ×3 IMPLANT
COVER SURGICAL LIGHT HANDLE (MISCELLANEOUS) ×3 IMPLANT
DECANTER SPIKE VIAL GLASS SM (MISCELLANEOUS) ×3 IMPLANT
DERMABOND ADVANCED (GAUZE/BANDAGES/DRESSINGS) ×2
DERMABOND ADVANCED .7 DNX12 (GAUZE/BANDAGES/DRESSINGS) IMPLANT
DRAPE HALF SHEET 40X57 (DRAPES) ×3 IMPLANT
DRAPE LAPAROSCOPIC ABDOMINAL (DRAPES) ×3 IMPLANT
DRAPE ORTHO SPLIT 77X108 STRL (DRAPES) ×6
DRAPE SURG ORHT 6 SPLT 77X108 (DRAPES) IMPLANT
DRAPE UNIVERSAL PACK (DRAPES) IMPLANT
DRAPE UTILITY XL STRL (DRAPES) ×6 IMPLANT
DRSG TEGADERM 4X4.75 (GAUZE/BANDAGES/DRESSINGS) ×6 IMPLANT
ELECT REM PT RETURN 9FT ADLT (ELECTROSURGICAL) ×3
ELECTRODE REM PT RTRN 9FT ADLT (ELECTROSURGICAL) ×1 IMPLANT
GAUZE SPONGE 2X2 8PLY STRL LF (GAUZE/BANDAGES/DRESSINGS) ×1 IMPLANT
GAUZE SPONGE 4X4 12PLY STRL (GAUZE/BANDAGES/DRESSINGS) ×3 IMPLANT
GLOVE BIO SURGEON STRL SZ 6 (GLOVE) ×3 IMPLANT
GLOVE BIOGEL PI IND STRL 6.5 (GLOVE) ×1 IMPLANT
GLOVE BIOGEL PI INDICATOR 6.5 (GLOVE) ×2
GLOVE INDICATOR 7.0 STRL GRN (GLOVE) ×2 IMPLANT
GLOVE SURG SS PI 7.0 STRL IVOR (GLOVE) ×2 IMPLANT
GOWN STRL REUS W/ TWL LRG LVL3 (GOWN DISPOSABLE) ×2 IMPLANT
GOWN STRL REUS W/TWL 2XL LVL3 (GOWN DISPOSABLE) ×6 IMPLANT
GOWN STRL REUS W/TWL LRG LVL3 (GOWN DISPOSABLE) ×6
IMMOBILIZER KNEE 20 (SOFTGOODS) ×3
IMMOBILIZER KNEE 20 THIGH 36 (SOFTGOODS) ×1 IMPLANT
KIT BASIN OR (CUSTOM PROCEDURE TRAY) ×3 IMPLANT
KIT ROOM TURNOVER OR (KITS) ×3 IMPLANT
NDL 18GX1X1/2 (RX/OR ONLY) (NEEDLE) ×1 IMPLANT
NDL HYPO 25GX1X1/2 BEV (NEEDLE) ×2 IMPLANT
NEEDLE 18GX1X1/2 (RX/OR ONLY) (NEEDLE) ×3 IMPLANT
NEEDLE FILTER BLUNT 18X 1/2SAF (NEEDLE)
NEEDLE FILTER BLUNT 18X1 1/2 (NEEDLE) IMPLANT
NEEDLE HYPO 25GX1X1/2 BEV (NEEDLE) ×6 IMPLANT
NS IRRIG 1000ML POUR BTL (IV SOLUTION) ×3 IMPLANT
PACK GENERAL/GYN (CUSTOM PROCEDURE TRAY) ×3 IMPLANT
PAD ARMBOARD 7.5X6 YLW CONV (MISCELLANEOUS) ×6 IMPLANT
SPECIMEN JAR MEDIUM (MISCELLANEOUS) ×3 IMPLANT
SPONGE GAUZE 2X2 STER 10/PKG (GAUZE/BANDAGES/DRESSINGS) ×2
STOCKINETTE IMPERVIOUS 9X36 MD (GAUZE/BANDAGES/DRESSINGS) ×3 IMPLANT
STRIP CLOSURE SKIN 1/2X4 (GAUZE/BANDAGES/DRESSINGS) ×2 IMPLANT
SUT ETHILON 2 0 FS 18 (SUTURE) ×4 IMPLANT
SUT MNCRL AB 4-0 PS2 18 (SUTURE) ×4 IMPLANT
SUT PROLENE 2 0 CT2 30 (SUTURE) ×2 IMPLANT
SUT SILK 3 0 SH 30 (SUTURE) ×3 IMPLANT
SUT VIC AB 2-0 SH 27 (SUTURE) ×6
SUT VIC AB 2-0 SH 27XBRD (SUTURE) IMPLANT
SUT VIC AB 3-0 SH 27 (SUTURE) ×6
SUT VIC AB 3-0 SH 27X BRD (SUTURE) ×2 IMPLANT
SUT VIC AB 3-0 SH 8-18 (SUTURE) ×2 IMPLANT
SUT VICRYL 4-0 PS2 18IN ABS (SUTURE) ×6 IMPLANT
SYR CONTROL 10ML LL (SYRINGE) ×6 IMPLANT
TOWEL OR 17X24 6PK STRL BLUE (TOWEL DISPOSABLE) ×3 IMPLANT
TOWEL OR 17X26 10 PK STRL BLUE (TOWEL DISPOSABLE) ×3 IMPLANT
WATER STERILE IRR 1000ML POUR (IV SOLUTION) IMPLANT

## 2017-02-12 NOTE — Transfer of Care (Signed)
Immediate Anesthesia Transfer of Care Note  Patient: [name]  Procedure(s) Performed: WIDE LOCAL EXCISION LEFT KNEE MELANOMA, ADVANCEMENT FLAP CLOSURE, AND SENTINEL LYMPH NODE MAPPING AND BIOPSY. (Left Knee)  Patient Location: PACU  Anesthesia Type:General  Level of Consciousness: awake, alert , oriented and patient cooperative  Airway & Oxygen Therapy: Patient Spontanous Breathing and Patient connected to nasal cannula oxygen  Post-op Assessment: Report given to RN and Post -op Vital signs reviewed and stable  Post vital signs: Reviewed and stable  Last Vitals:  Vitals:   02/12/17 1324  BP: (!) 142/85  Pulse: 75  Resp: 19  Temp: 36.9 C  SpO2: 98%    Last Pain:  Vitals:   02/12/17 1343  TempSrc:   PainSc: 3       Patients Stated Pain Goal: 3 (73/66/81 [id number])  Complications: No apparent anesthesia complications

## 2017-02-12 NOTE — Interval H&P Note (Signed)
History and Physical Interval Note:  02/12/2017 2:30 PM  [name]  has presented today for surgery, with the diagnosis of LEFT KNEE MELANOMA   The various methods of treatment have been discussed with the patient and family. After consideration of risks, benefits and other options for treatment, the patient has consented to  Procedure(s) with comments: WIDE LOCAL EXCISION LEFT KNEE MELANOMA, ADVANCEMENT FLAP CLOSURE, AND SENTINEL LYMPH NODE MAPPING AND BIOPSY. (Left) - GENERAL AND LOCAL as a surgical intervention .  The patient's history has been reviewed, patient examined, no change in status, stable for surgery.  I have reviewed the patient's chart and labs.  Questions were answered to the patient's satisfaction.     [name]

## 2017-02-12 NOTE — H&P (Signed)
[name] 02/03/2017 2:15 PM Location: [facility] Patient #: [id number] DOB: 1945-08-10 Divorced / Language: [name] / Race: White Male   History of Present Illness Stark Klein MD; 02/03/2017 2:56 PM) The patient is a 71 year old male who presents with malignant melanoma. Pt is a 71 yo M referred by Dr. 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 The patient has had a mole there for around a year and a half, but it started to grow rapidly and get "ugly" in the last month or so. He denies bleeding or itching of the lesion. He has not had melanoma before. He denies other new lesions of concern. He is followed by Dr. Fontaine No regularly. His mother had melanoma and pancreatic cancer.   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 No satellitosis, 3-4 mitoses per high power field. brisk TIL, no regression.   He is followed by Dr. Marin Olp for iron deficiency anemia and is getting set up for colonoscopy.   Past Surgical History (Tanisha A. Owens Shark, [place]; 02/03/2017 2:15 PM) Appendectomy  Cataract Surgery  Bilateral. Colon Polyp Removal - Colonoscopy  Colon Polyp Removal - Open  Foot Surgery  Bilateral. Knee Surgery  Bilateral. Open Inguinal Hernia Surgery  Left. Oral Surgery  Shoulder Surgery  Bilateral. Spinal Surgery - Lower Back   Diagnostic Studies History (Tanisha A. Owens Shark, [place]; 02/03/2017 2:15 PM) Colonoscopy  1-5 years ago  Allergies (Tanisha A. Owens Shark, [place]; 02/03/2017 2:19 PM) Penicillins  Allergies Reconciled   Medication History (Tanisha A. Owens Shark, [place]; 02/03/2017 2:18 PM) HYDROmorphone HCl (2MG Tablet, Oral) Active. ALPRAZolam (0.5MG Tablet, Oral) Active. Alfuzosin HCl ER (10MG Tablet ER 24HR, Oral) Active. Clopidogrel Bisulfate (75MG Tablet, Oral) Active. Colcrys (0.6MG Tablet, Oral) Active. FLUoxetine HCl (40MG Capsule, Oral) Active. Fluticasone  Propionate (50MCG/ACT Suspension, Nasal) Active. Gabapentin (300MG Capsule, Oral) Active. Losartan Potassium-HCTZ (50-12.5MG Tablet, Oral) Active. Metoprolol Tartrate (25MG Tablet, Oral) Active. Pantoprazole Sodium (40MG Tablet DR, Oral) Active. PredniSONE (5MG Tablet, Oral) Active. Pravastatin Sodium (10MG Tablet, Oral) Active. Medications Reconciled  Social History (Tanisha A. Owens Shark, [place]; 02/03/2017 2:15 PM) Alcohol use  Remotely quit alcohol use. Caffeine use  Coffee. Tobacco use  Never smoker.  Family History (Tanisha A. Owens Shark, [place]; 02/03/2017 2:15 PM) Colon Polyps  Father. Heart Disease  Father. Malignant Neoplasm Of Pancreas  Mother. Melanoma  Mother. Prostate Cancer  Father. Respiratory Condition  Father, Mother. Seizure disorder  Brother.  Other Problems (Tanisha A. Owens Shark, [place]; 02/03/2017 2:15 PM) Anxiety Disorder  Arthritis  Back Pain  Bladder Problems  Chronic Obstructive Lung Disease  Diverticulosis  Gastroesophageal Reflux Disease  Hemorrhoids  High blood pressure  Hypercholesterolemia  Inguinal Hernia  Lump In Breast  Melanoma  Transfusion history  Ventral Hernia Repair     Review of Systems (Tanisha A. Brown RMA; 02/03/2017 2:15 PM) General Present- Appetite Loss, Chills and Fatigue. Not Present- Fever, Night Sweats, Weight Gain and Weight Loss. Skin Present- Change in Wart/Mole. Not Present- Dryness, Hives, Jaundice, New Lesions, Non-Healing Wounds, Rash and Ulcer. HEENT Present- Wears glasses/contact lenses. Not Present- Earache, Hearing Loss, Hoarseness, Nose Bleed, Oral Ulcers, Ringing in the Ears, Seasonal Allergies, Sinus Pain, Sore Throat, Visual Disturbances and Yellow Eyes. Respiratory Present- Difficulty Breathing. Not Present- Bloody sputum, Chronic Cough, Snoring and Wheezing. Cardiovascular Present- Shortness of Breath and Swelling of Extremities. Not Present- Chest Pain, Difficulty Breathing Lying Down, Leg  Cramps, Palpitations and Rapid Heart Rate. Gastrointestinal Present- Abdominal Pain, Bloody  Stool, Excessive gas, Gets full quickly at meals, Hemorrhoids and Indigestion. Not Present- Bloating, Change in Bowel Habits, Chronic diarrhea, Constipation, Difficulty Swallowing, Nausea, Rectal Pain and Vomiting. Male Genitourinary Present- Change in Urinary Stream, Frequency, Nocturia and Urine Leakage. Not Present- Blood in Urine, Impotence, Painful Urination and Urgency. Musculoskeletal Present- Back Pain, Joint Pain, Joint Stiffness, Muscle Pain, Muscle Weakness and Swelling of Extremities. Neurological Present- Trouble walking and Weakness. Not Present- Decreased Memory, Fainting, Headaches, Numbness, Seizures, Tingling and Tremor. Psychiatric Present- Anxiety. Not Present- Bipolar, Change in Sleep Pattern, Depression, Fearful and Frequent crying. Endocrine Present- Excessive Hunger. Not Present- Cold Intolerance, Hair Changes, Heat Intolerance, Hot flashes and New Diabetes. Hematology Present- Blood Thinners, Easy Bruising and Excessive bleeding. Not Present- Gland problems, HIV and Persistent Infections.  Vitals (Tanisha A. Brown RMA; 02/03/2017 2:16 PM) 02/03/2017 2:16 PM Weight: 151.6 lb Height: 66in Body Surface Area: 1.78 m Body Mass Index: 24.47 kg/m  Temp.: 98.79F  Pulse: 61 (Regular)  BP: 125/72 (Sitting, Left Arm, Standard)       Physical Exam Stark Klein MD; 02/03/2017 2:57 PM) General Mental Status-Alert. General Appearance-Consistent with stated age. Hydration-Well hydrated. Voice-Normal.  Integumentary Note: 1 cm scab/residual lesion left medial knee.   Head and Neck Head-normocephalic, atraumatic with no lesions or palpable masses. Trachea-midline. Thyroid Gland Characteristics - normal size and consistency.  Eye Eyeball - Bilateral-Extraocular movements intact. Sclera/Conjunctiva - Bilateral-No scleral icterus.  Chest and Lung  Exam Chest and lung exam reveals -quiet, even and easy respiratory effort with no use of accessory muscles and on auscultation, normal breath sounds, no adventitious sounds and normal vocal resonance. Inspection Chest Wall - Normal. Back - normal.  Cardiovascular Cardiovascular examination reveals -normal heart sounds, regular rate and rhythm with no murmurs and normal pedal pulses bilaterally.  Abdomen Inspection Inspection of the abdomen reveals - No Hernias. Palpation/Percussion Palpation and Percussion of the abdomen reveal - Soft, Non Tender, No Rebound tenderness, No Rigidity (guarding) and No hepatosplenomegaly. Auscultation Auscultation of the abdomen reveals - Bowel sounds normal.  Neurologic Neurologic evaluation reveals -alert and oriented x 3 with no impairment of recent or remote memory. Mental Status-Normal.  Musculoskeletal Global Assessment -Note: hand joint changes c/w RA. scar over left knee c/w left TKA.  Normal Exam - Left-Upper Extremity Strength Normal and Lower Extremity Strength Normal. Normal Exam - Right-Upper Extremity Strength Normal and Lower Extremity Strength Normal.  Lymphatic Head & Neck  General Head & Neck Lymphatics: Bilateral - Description - Normal. Axillary  General Axillary Region: Bilateral - Description - Normal. Tenderness - Non Tender. Femoral & Inguinal  Generalized Femoral & Inguinal Lymphatics: Bilateral - Description - No Generalized lymphadenopathy.    Assessment & Plan Stark Klein MD; 02/03/2017 2:59 PM) MALIGNANT MELANOMA OF SKIN OF KNEE, LEFT (C43.72) Impression: Pt has new dx of clinical T3bN0 melanoma medial left knee.  Recommend wide local excision wtih advancement flap closure and sentinel lymph node mapping and biopsy.  Pt has been seen recently by Dr. Johnsie Cancel without having any new issues. No cardiac stents for 2 years. Will hold plavix for 5-7 days.  Discussed risk of surgery including bleeding,  infection, damage to adjacent structures, numbness, wound breakdown, possible need for additional surgery, and more. Reviewed sentinel node injection.  Pt wishes to proceed. Current Plans Schedule for Surgery Pt Education - Melanoma: melanoma   Signed by Stark Klein, MD (02/03/2017 3:00 PM)

## 2017-02-12 NOTE — Discharge Instructions (Addendum)
[place] Office Phone Number [phone]   POST OP INSTRUCTIONS  Always review your discharge instruction sheet given to you by the facility where your surgery was performed.  IF YOU HAVE DISABILITY OR FAMILY LEAVE FORMS, YOU MUST BRING THEM TO THE OFFICE FOR PROCESSING.  DO NOT GIVE THEM TO YOUR DOCTOR.  1. A prescription for pain medication may be given to you upon discharge.  Take your pain medication as prescribed, if needed.  If narcotic pain medicine is not needed, then you may take acetaminophen (Tylenol) or ibuprofen (Advil) as needed. 2. Take your usually prescribed medications unless otherwise directed 3. If you need a refill on your pain medication, please contact your pharmacy.  They will contact our office to request authorization.  Prescriptions will not be filled after 5pm or on week-ends. 4. You should eat very light the first 24 hours after surgery, such as soup, crackers, pudding, etc.  Resume your normal diet the day after surgery 5. It is common to experience some constipation if taking pain medication after surgery.  Increasing fluid intake and taking a stool softener will usually help or prevent this problem from occurring.  A mild laxative (Milk of Magnesia or Miralax) should be taken according to package directions if there are no bowel movements after 48 hours. 6. You may shower in 48 hours.  The surgical glue will flake off in 2-3 weeks.   7. ACTIVITIES:  No strenuous activity or heavy lifting for 2 weeks.   a. You may drive when you no longer are taking prescription pain medication, you can comfortably wear a seatbelt, and you can safely maneuver your car and apply brakes. b. RETURN TO WORK:  ________n/a_____________ Dennis Bast should see your doctor in the office for a follow-up appointment approximately three-four weeks after your surgery.    WHEN TO CALL YOUR DOCTOR: 1. Fever over 101.0 2. Nausea and/or vomiting. 3. Extreme swelling or  bruising. 4. Continued bleeding from incision. 5. Increased pain, redness, or drainage from the incision.  The clinic staff is available to answer your questions during regular business hours.  Please dont hesitate to call and ask to speak to one of the nurses for clinical concerns.  If you have a medical emergency, go to the nearest emergency room or call 911.  A surgeon from [facility] is always on call at the hospital.  For further questions, please visit [email or web address]    Wear knee immobilizer for 1 week.  Then OK to remove.  Dry dressing as needed while in knee immobilizer.

## 2017-02-12 NOTE — Anesthesia Preprocedure Evaluation (Signed)
Anesthesia Evaluation  Patient identified by MRN, date of birth, ID band Patient awake    Reviewed: Allergy & Precautions, H&P , NPO status , Patient's Chart, lab work & pertinent test results, reviewed documented beta blocker date and time   History of Anesthesia Complications (+) PONV and history of anesthetic complications  Airway Mallampati: II  TM Distance: >3 FB Neck ROM: Full    Dental  (+) Teeth Intact, Dental Advisory Given   Pulmonary COPD,    breath sounds clear to auscultation       Cardiovascular hypertension, Pt. on medications and Pt. on home beta blockers  Rhythm:Regular Rate:Normal     Neuro/Psych Anxiety Depression  Neuromuscular disease    GI/Hepatic GERD  ,  Endo/Other    Renal/GU      Musculoskeletal  (+) Arthritis ,   Abdominal   Peds  Hematology  (+) anemia ,   Anesthesia Other Findings Melanoma  Reproductive/Obstetrics                             Anesthesia Physical  Anesthesia Plan  ASA: III  Anesthesia Plan: General   Post-op Pain Management:    Induction: Intravenous  PONV Risk Score and Plan: 3 and Ondansetron, Dexamethasone and Midazolam  Airway Management Planned: Oral ETT  Additional Equipment:   Intra-op Plan:   Post-operative Plan: Extubation in OR  Informed Consent: I have reviewed the patients History and Physical, chart, labs and discussed the procedure including the risks, benefits and alternatives for the proposed anesthesia with the patient or authorized representative who has indicated his/her understanding and acceptance.   Dental advisory given  Plan Discussed with: CRNA and Anesthesiologist  Anesthesia Plan Comments: (CAD S/P PTCA 2006, (-) nuclear stress test 2012 H/O paroxysmal Afib 2012, EF 60% on TEE Hypokalemia  Plan GA with oral ETT)        Anesthesia Quick Evaluation

## 2017-02-12 NOTE — Progress Notes (Signed)
Pt arrived to unit from PACU prior to shift change. Alert and oriented x4. VSS. Incision CDI and immobilizer in place. Pt placed on heart healthy diet. No c/o pain at this time. Will continue to monitor pt closely. Leanne Chang, RN

## 2017-02-12 NOTE — Anesthesia Procedure Notes (Signed)
Procedure Name: LMA Insertion Date/Time: 02/12/2017 2:59 PM Performed by: White, Amedeo Plenty, CRNA Pre-anesthesia Checklist: Patient identified, Emergency Drugs available, Suction available and Patient being monitored Patient Re-evaluated:Patient Re-evaluated prior to induction Oxygen Delivery Method: Circle System Utilized Preoxygenation: Pre-oxygenation with 100% oxygen Induction Type: IV induction Ventilation: Mask ventilation without difficulty LMA: LMA inserted LMA Size: 5.0 Number of attempts: 1 Placement Confirmation: positive ETCO2 Tube secured with: Tape Dental Injury: Teeth and Oropharynx as per pre-operative assessment

## 2017-02-12 NOTE — Op Note (Signed)
PRE-OPERATIVE DIAGNOSIS: cT2b left medial knee melanoma  POST-OPERATIVE DIAGNOSIS:  Same  PROCEDURE:  Procedure(s): Wide local excision 1-2 cm margins, advancement flap closure for defect 4 cm x 6 cm, left inguinal sentinel lymph node mapping and biopsy  SURGEON:  Surgeon(s): Stark Klein, MD  ANESTHESIA:   local and general  DRAINS: none   LOCAL MEDICATIONS USED:  MARCAINE    and XYLOCAINE   SPECIMEN:  Source of Specimen:  wide local excision left medial knee melanoma, 3 inguinal sentinel lymph nodes  FINDINGS:  No gross residual disease.  SLN #1 superior inguinal node, partially blue.  SLN #2 lower inguinal region hot and blue, SLN #3 hot and blue, lower inguinal region.    DISPOSITION OF SPECIMEN:  PATHOLOGY  COUNTS:  YES  PLAN OF CARE: Discharge to home after PACU  PATIENT DISPOSITION:  PACU - hemodynamically stable.    PROCEDURE:   Pt was identified in the holding area, taken to the OR, and placed supine on the OR table.  General anesthesia was induced.  Time out was performed according to the surgical safety checklist.  When all was correct, we continued.  Two mL methylene blue was injected intradermally around the melanoma biopsy site.    The patient's left leg and groin were prepped and draped in sterile fashion.   The melanoma was identified and 1 cm anterior to posterior margins and 2 cm margins superior to inferior were marked out. The melanoma was in the line of the patella where the skin was relatively tight, so that is why the transverse margin was lower.  Local anesthetic was administered under the melanoma and the adjacent tissue.  A #10 blade was used to incise the skin.  The cautery was used to take the dissection down to the fascia.  The melanoma specimen was marked in situ with silk suture.  The cautery was used to take the specimen off the fascia, and it was passed off the table.    Skin hooks were used to elevate the edges of the incision and the skin was freed  up in all directions.  This was pulled together in a longitudinal orientation.  Deep interrupted 2-0 vicryl sutures were placed to relieve tension.  The skin was then reapproximated with 3-0 interrupted vicryl deep dermal sutures and 4-0 monocryl running subcuticular sutures.  Four 2-0 nylon horizontal mattress sutures were placed as well.  The wound was dressed with Benzoin, steristrips, gauze, and tegaderm.    Turning the attention to the groin, the point of maximum signal intensity was identified with the neoprobe.  There was unfortunately an area around 4 cm above the inguinal ligament, and another area around 5 cm below the inguinal ligament.  It was felt that these could not be reached from a single incision.  The superior inguinal region was addressed first.  A 3 cm incision was made with a #15 blade.  The subcutaneous tissues were divided with the cautery.  A Weitlaner retractor was used to assist with visualization.  The tonsil clamp was used to bluntly dissect the inguinal fat pad.  1 sentinel lymph node was identified in this location. The lymphovascular channels were clipped with hemoclips.  The lower region was then addressed.  A 2 cm incision was made after administration of local.  Two blue and hot nodes were located deep in the inguinal fat pad.  The nodes were passed off as specimens.  Hemostasis was achieved with the cautery.  Both of these incisions  were irrigated and closed with 3-0 Vicryl deep dermal interrupted sutures and 4-0 Monocryl running subcuticular suture.  The wounds were then cleaned, dried, and dressed with Benzoin, steristrips, gauze, and tegaderm.  Counts were correct.  Needle, sponge, and instrument counts were correct.  The patient was awakened from anesthesia and taken to the PACU in stable condition.

## 2017-02-13 ENCOUNTER — Telehealth: Payer: Self-pay | Admitting: *Deleted

## 2017-02-13 ENCOUNTER — Encounter (HOSPITAL_COMMUNITY): Payer: Self-pay | Admitting: General Surgery

## 2017-02-13 DIAGNOSIS — C774 Secondary and unspecified malignant neoplasm of inguinal and lower limb lymph nodes: Secondary | ICD-10-CM | POA: Diagnosis not present

## 2017-02-13 DIAGNOSIS — I10 Essential (primary) hypertension: Secondary | ICD-10-CM | POA: Diagnosis not present

## 2017-02-13 DIAGNOSIS — C4372 Malignant melanoma of left lower limb, including hip: Secondary | ICD-10-CM | POA: Diagnosis not present

## 2017-02-13 DIAGNOSIS — J449 Chronic obstructive pulmonary disease, unspecified: Secondary | ICD-10-CM | POA: Diagnosis not present

## 2017-02-13 DIAGNOSIS — F418 Other specified anxiety disorders: Secondary | ICD-10-CM | POA: Diagnosis not present

## 2017-02-13 DIAGNOSIS — K219 Gastro-esophageal reflux disease without esophagitis: Secondary | ICD-10-CM | POA: Diagnosis not present

## 2017-02-13 LAB — BASIC METABOLIC PANEL
Anion gap: 5 (ref 5–15)
BUN: 10 mg/dL (ref 6–20)
CO2: 28 mmol/L (ref 22–32)
Calcium: 8 mg/dL — ABNORMAL LOW (ref 8.9–10.3)
Chloride: 97 mmol/L — ABNORMAL LOW (ref 101–111)
Creatinine, Ser: 0.86 mg/dL (ref 0.61–1.24)
GFR calc Af Amer: >60 mL/min (ref >60)
GFR calc non Af Amer: >60 mL/min (ref >60)
Glucose, Bld: 200 mg/dL — ABNORMAL HIGH (ref 65–99)
Potassium: 3.6 mmol/L (ref 3.5–5.1)
Sodium: 130 mmol/L — ABNORMAL LOW (ref 135–145)

## 2017-02-13 LAB — CBC
HCT: 30.9 % — ABNORMAL LOW (ref 39.0–52.0)
Hemoglobin: 9.7 g/dL — ABNORMAL LOW (ref 13.0–17.0)
MCH: 26.9 pg (ref 26.0–34.0)
MCHC: 31.4 g/dL (ref 30.0–36.0)
MCV: 85.6 fL (ref 78.0–100.0)
Platelets: 206 10*3/uL (ref 150–400)
RBC: 3.61 MIL/uL — ABNORMAL LOW (ref 4.22–5.81)
RDW: 19.2 % — ABNORMAL HIGH (ref 11.5–15.5)
WBC: 6 10*3/uL (ref 4.0–10.5)

## 2017-02-13 MED ORDER — MENTHOL 3 MG MT LOZG
1.0000 | LOZENGE | OROMUCOSAL | Status: DC | PRN
  Administered 2017-02-13: 3 mg via ORAL
  Filled 2017-02-13: qty 9

## 2017-02-13 MED ORDER — HYDROMORPHONE HCL 2 MG PO TABS: 2.0000 mg | ORAL_TABLET | Freq: Three times a day (TID) | ORAL | 0 refills | Status: DC | PRN

## 2017-02-13 NOTE — Telephone Encounter (Signed)
Pt was on TCM list admitted 02/12/17 for Malignant melanoma of left knee. Pt was admitted to the floor following wide local excision with advancement flap closure and inguinal sentinel lymph node biopsy.  He had significant hypotension intraoperatively and is relatively frail overall.  He was kept overnight in order to make sure he was stable prior to d/c on 02/13/17, and will f/u w/surgeon  Stark Klein, MD Follow up in 2 week(s).   Specialty:  General Surgery

## 2017-02-13 NOTE — Anesthesia Postprocedure Evaluation (Signed)
Anesthesia Post Note  Patient: [name]  Procedure(s) Performed: WIDE LOCAL EXCISION LEFT KNEE MELANOMA, ADVANCEMENT FLAP CLOSURE, AND SENTINEL LYMPH NODE MAPPING AND BIOPSY. (Left Knee)     Patient location during evaluation: PACU Anesthesia Type: General Level of consciousness: awake and alert Pain management: pain level controlled Vital Signs Assessment: post-procedure vital signs reviewed and stable Respiratory status: spontaneous breathing, nonlabored ventilation, respiratory function stable and patient connected to nasal cannula oxygen Cardiovascular status: blood pressure returned to baseline and stable Postop Assessment: no apparent nausea or vomiting Anesthetic complications: no    Last Vitals:  Vitals:   02/13/17 0220 02/13/17 0545  BP: 106/82 (!) 109/53  Pulse: 70 73  Resp: 18 18  Temp: 36.7 C 36.8 C  SpO2: 97% 95%    Last Pain:  Vitals:   02/13/17 0651  TempSrc:   PainSc: 1                  Stoy Fenn,JAMES TERRILL

## 2017-02-13 NOTE — Discharge Summary (Signed)
Physician Discharge Summary  Patient ID: [name] MRN: [id number] DOB/AGE: 69-Oct-1947 69 y.o.  Admit date: 02/12/2017 Discharge date: 02/13/2017  Admission Diagnoses: Patient Active Problem List   Diagnosis Date Noted  . 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[place]) 02/12/2017  . Iron deficiency anemia due to chronic blood loss 12/27/2016  . Scalp wound 06/13/2016  . Scalp laceration, subsequent encounter 06/04/2016  . 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  . Pain in joint, ankle and foot 09/28/2015  . Anemia due to chronic blood loss 09/28/2015  . Hyponatremia 09/28/2015  . Cramp in limb 09/28/2015  . Exertional angina ([place]) 06/17/2014  . DOE (dyspnea on exertion) 04/28/2014  . Hypokalemia 03/11/2014  . Preventative health care 12/31/2013  . Impaired glucose tolerance 12/31/2013  . 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[place]) 11/18/2013  . Fever 11/18/2013  . Leukocytosis 10/30/2013  . Fatigue 10/22/2013  . Hoarseness of voice 08/10/2013  . Sleep disorder 07/05/2013  . Tension headache 07/01/2013  . Polypharmacy 06/19/2013  . Lumbar post-laminectomy syndrome 05/27/2013  . Degenerative arthritis of spine 05/26/2013  . Hemochromatosis 05/26/2013  . Wound infection after surgery 05/26/2013  . Normocytic anemia 05/26/2013  . Unintentional weight loss 05/26/2013  . Protein-calorie malnutrition, severe ([place]) 05/25/2013  . Chronic pain syndrome 05/23/2013  . COPD (chronic obstructive pulmonary disease) ([place]) 05/06/2013  . Frequent falls 05/06/2013  . Encounter for postoperative wound care 05/06/2013  . Atrial fibrillation ([place]) 08/07/2010  . Rheumatoid arthritis ([place]) 10/18/2008  . Anxiety 11/09/2007  . Elevated lipids 03/30/2007  . PSEUDOGOUT 03/30/2007  . Essential hypertension 03/30/2007  . Coronary atherosclerosis 03/30/2007    Discharge Diagnoses:  Active Problems:   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HCC) and same as above  Discharged Condition: stable  Hospital  Course:  Pt was admitted to the floor following wide local excision with advancement flap closure and inguinal sentinel lymph node biopsy.  He had significant hypotension intraoperatively and is relatively frail overall.  He was kept overnight in order to make sure he was stable prior to d/c.  He did well overnight and was able to get out of bed.  His pain was controlled with oral dilaudid.  He is on this at home as an outpatient and this was continued as an inpatient.  He denied nausea.    Consults: None  Significant Diagnostic Studies: labs: see epic  Treatments: surgery: see above  Discharge Exam: Blood pressure (!) 121/57, pulse 66, temperature 98 F (36.7 C), temperature source Oral, resp. rate 16, height 5' 6.5" (1.689 m), weight 68.5 kg (151 lb 0.1 oz), SpO2 98 %. General appearance: alert, cooperative and no distress Resp: breathing comfortably Extremities: dressing on medial knee with some serosang staining.   Skin: inguinal node biopsy sites look good.    Disposition: 01-Home or Self Care  Discharge Instructions    Call MD for:  difficulty breathing, headache or visual disturbances   Complete by:  As directed    Call MD for:  persistant nausea and vomiting   Complete by:  As directed    Call MD for:  redness, tenderness, or signs of infection (pain, swelling, redness, odor or green/yellow discharge around incision site)   Complete by:  As directed    Call MD for:  severe uncontrolled pain   Complete by:  As directed    Call MD for:  temperature >100.4   Complete by:  As directed    Diet - low sodium heart healthy   Complete by:  As directed  Increase activity slowly   Complete by:  As directed    Wear knee immobilizer for 1 week at all times except in shower and while resting.  After 1 week, wear while walking.     Allergies as of 02/13/2017      Reactions   Cefepime Hives, Shortness Of Breath   Morphine And Related Shortness Of Breath, Nausea And Vomiting,  Swelling, Other (See Comments)   Agitation, tolerates dilaudid   Penicillins Hives, Shortness Of Breath, Rash   Has patient had a PCN reaction causing immediate rash, facial/tongue/throat swelling, SOB or lightheadedness with hypotension: Yes Has patient had a PCN reaction causing severe rash involving mucus membranes or skin necrosis: Yes Has patient had a PCN reaction that required hospitalization No Has patient had a PCN reaction occurring within the last 10 years: No If all of the above answers are "NO", then may proceed with Cephalosporin use.      Medication List    TAKE these medications   albuterol 108 (90 Base) MCG/ACT inhaler Commonly known as:  PROVENTIL HFA;VENTOLIN HFA Inhale 1-2 puffs into the lungs every 4 (four) hours as needed for wheezing or shortness of breath.   alfuzosin 10 MG 24 hr tablet Commonly known as:  UROXATRAL Take 10 mg by mouth at bedtime.   ALPRAZolam 0.5 MG tablet Commonly known as:  XANAX Take 1 tablet (0.5 mg total) by mouth 2 (two) times daily as needed. for anxiety   CIALIS 5 MG tablet Generic drug:  tadalafil Take 5 mg by mouth as needed.   clopidogrel 75 MG tablet Commonly known as:  PLAVIX TAKE 1 TABLET BY MOUTH EVERY DAY WITH BREAKFAST   COLCRYS 0.6 MG tablet Generic drug:  colchicine Take 1 tablet by mouth every evening.   FISH OIL OMEGA-3 1000 MG Caps Take 1,000 mg by mouth 2 (two) times daily.   FLUoxetine 40 MG capsule Commonly known as:  PROZAC Take 1 capsule (40 mg total) by mouth 2 (two) times daily.   fluticasone 50 MCG/ACT nasal spray Commonly known as:  FLONASE INSTILL 2 SPRAYS IN EACH NOSTRIL EVERY DAY   gabapentin 300 MG capsule Commonly known as:  NEURONTIN Take 300 mg by mouth 2 (two) times daily.   HYDROmorphone 2 MG tablet Commonly known as:  DILAUDID Take 1-2 tablets (2-4 mg total) every 8 (eight) hours as needed by mouth for severe pain. Please fill on or after 01/26/17 What changed:  when to take  this   losartan-hydrochlorothiazide 50-12.5 MG tablet Commonly known as:  HYZAAR TAKE 1 TABLET BY MOUTH DAILY   Melatonin 5 MG Tabs Take 5 mg by mouth at bedtime.   metoprolol tartrate 25 MG tablet Commonly known as:  LOPRESSOR Take 1 tablet (25 mg total) by mouth 2 (two) times daily.   ondansetron 4 MG tablet Commonly known as:  ZOFRAN Take 1 tablet (4 mg total) by mouth every 8 (eight) hours as needed for nausea or vomiting.   pantoprazole 40 MG tablet Commonly known as:  PROTONIX Take 1 tablet (40 mg total) by mouth every evening.   pravastatin 20 MG tablet Commonly known as:  PRAVACHOL Take 1 tablet (20 mg total) by mouth daily. What changed:  when to take this   predniSONE 5 MG tablet Commonly known as:  DELTASONE Take 5 mg by mouth daily with breakfast.   vitamin B-12 1000 MCG tablet Commonly known as:  CYANOCOBALAMIN Take 1,000 mcg by mouth at bedtime.   Vitamin D3 2000  units capsule Take 1 capsule (2,000 Units total) by mouth daily.      Follow-up Information    Stark Klein, MD Follow up in 2 week(s).   Specialty:  General Surgery Contact information: [address] [place] [address] [phone]           Signed: Stark Klein 02/13/2017, 9:24 AM

## 2017-02-14 ENCOUNTER — Ambulatory Visit: Payer: Medicare Other | Admitting: Gastroenterology

## 2017-02-18 ENCOUNTER — Telehealth: Payer: Self-pay | Admitting: General Surgery

## 2017-02-18 NOTE — Telephone Encounter (Signed)
Discussed pathology with patient.  1/3 LN positive.  Will need pet and referral back to Dr. Marin Olp.

## 2017-02-19 DIAGNOSIS — M25542 Pain in joints of left hand: Secondary | ICD-10-CM | POA: Diagnosis not present

## 2017-02-19 DIAGNOSIS — M25641 Stiffness of right hand, not elsewhere classified: Secondary | ICD-10-CM | POA: Diagnosis not present

## 2017-02-19 DIAGNOSIS — M25642 Stiffness of left hand, not elsewhere classified: Secondary | ICD-10-CM | POA: Diagnosis not present

## 2017-02-19 DIAGNOSIS — M25541 Pain in joints of right hand: Secondary | ICD-10-CM | POA: Diagnosis not present

## 2017-02-20 ENCOUNTER — Other Ambulatory Visit (HOSPITAL_COMMUNITY): Payer: Self-pay | Admitting: General Surgery

## 2017-02-20 DIAGNOSIS — C4372 Malignant melanoma of left lower limb, including hip: Secondary | ICD-10-CM

## 2017-02-24 DIAGNOSIS — M25541 Pain in joints of right hand: Secondary | ICD-10-CM | POA: Diagnosis not present

## 2017-02-24 DIAGNOSIS — M25542 Pain in joints of left hand: Secondary | ICD-10-CM | POA: Diagnosis not present

## 2017-02-24 DIAGNOSIS — M25642 Stiffness of left hand, not elsewhere classified: Secondary | ICD-10-CM | POA: Diagnosis not present

## 2017-02-24 DIAGNOSIS — M25641 Stiffness of right hand, not elsewhere classified: Secondary | ICD-10-CM | POA: Diagnosis not present

## 2017-02-26 ENCOUNTER — Ambulatory Visit: Payer: Medicare Other | Admitting: Internal Medicine

## 2017-03-01 ENCOUNTER — Other Ambulatory Visit: Payer: Self-pay | Admitting: Internal Medicine

## 2017-03-03 ENCOUNTER — Other Ambulatory Visit: Payer: Self-pay

## 2017-03-03 ENCOUNTER — Telehealth: Payer: Self-pay | Admitting: Internal Medicine

## 2017-03-03 ENCOUNTER — Ambulatory Visit: Payer: Medicare Other | Admitting: Family

## 2017-03-03 ENCOUNTER — Other Ambulatory Visit: Payer: Medicare Other

## 2017-03-03 MED ORDER — METOPROLOL TARTRATE 25 MG PO TABS: 25.0000 mg | ORAL_TABLET | Freq: Two times a day (BID) | ORAL | 1 refills | Status: DC

## 2017-03-03 MED ORDER — HYDROMORPHONE HCL 2 MG PO TABS: 2.0000 mg | ORAL_TABLET | Freq: Three times a day (TID) | ORAL | 0 refills | Status: DC | PRN

## 2017-03-03 NOTE — Telephone Encounter (Signed)
Pt called and needs a refill of his HYDROmorphone (DILAUDID) 2 MG tablet  Would like a 90 day supply,  Please advise

## 2017-03-03 NOTE — Telephone Encounter (Signed)
Ok Thx 

## 2017-03-03 NOTE — Telephone Encounter (Signed)
Check [place] registry last filled 02/13/2017 #5 day supply was given by Dr. Stark Klein...[name]

## 2017-03-04 NOTE — Telephone Encounter (Signed)
Notified pt w/MD response.../lmb 

## 2017-03-06 ENCOUNTER — Encounter: Payer: Self-pay | Admitting: Genetic Counselor

## 2017-03-06 ENCOUNTER — Ambulatory Visit (HOSPITAL_BASED_OUTPATIENT_CLINIC_OR_DEPARTMENT_OTHER): Payer: Medicare Other | Admitting: Genetic Counselor

## 2017-03-06 ENCOUNTER — Other Ambulatory Visit (HOSPITAL_BASED_OUTPATIENT_CLINIC_OR_DEPARTMENT_OTHER): Payer: Medicare Other

## 2017-03-06 DIAGNOSIS — Z809 Family history of malignant neoplasm, unspecified: Secondary | ICD-10-CM | POA: Diagnosis present

## 2017-03-06 DIAGNOSIS — D500 Iron deficiency anemia secondary to blood loss (chronic): Secondary | ICD-10-CM

## 2017-03-06 DIAGNOSIS — Z1379 Encounter for other screening for genetic and chromosomal anomalies: Secondary | ICD-10-CM

## 2017-03-06 DIAGNOSIS — C439 Malignant melanoma of skin, unspecified: Secondary | ICD-10-CM | POA: Diagnosis not present

## 2017-03-06 DIAGNOSIS — Z86010 Personal history of colonic polyps: Secondary | ICD-10-CM | POA: Diagnosis not present

## 2017-03-06 HISTORY — DX: Encounter for other screening for genetic and chromosomal anomalies: Z13.79

## 2017-03-06 LAB — COMPREHENSIVE METABOLIC PANEL
ALT: 20 U/L (ref 0–55)
AST: 20 U/L (ref 5–34)
Albumin: 3.7 g/dL (ref 3.5–5.0)
Alkaline Phosphatase: 78 U/L (ref 40–150)
Anion Gap: 8 mEq/L (ref 3–11)
BUN: 20 mg/dL (ref 7.0–26.0)
CO2: 30 mEq/L — ABNORMAL HIGH (ref 22–29)
Calcium: 9.4 mg/dL (ref 8.4–10.4)
Chloride: 97 mEq/L — ABNORMAL LOW (ref 98–109)
Creatinine: 1 mg/dL (ref 0.7–1.3)
EGFR: >60 mL/min/{1.73_m2} (ref >60)
Glucose: 112 mg/dl (ref 70–140)
Potassium: 4.2 mEq/L (ref 3.5–5.1)
Sodium: 135 mEq/L — ABNORMAL LOW (ref 136–145)
Total Bilirubin: 0.47 mg/dL (ref 0.20–1.20)
Total Protein: 6.6 g/dL (ref 6.4–8.3)

## 2017-03-06 LAB — CBC WITH DIFFERENTIAL/PLATELET
BASO%: 0.4 % (ref 0.0–2.0)
Basophils Absolute: 0 10*3/uL (ref 0.0–0.1)
EOS%: 3.1 % (ref 0.0–7.0)
Eosinophils Absolute: 0.2 10*3/uL (ref 0.0–0.5)
HCT: 37.3 % — ABNORMAL LOW (ref 38.4–49.9)
HGB: 11.8 g/dL — ABNORMAL LOW (ref 13.0–17.1)
LYMPH%: 24.6 % (ref 14.0–49.0)
MCH: 27.9 pg (ref 27.2–33.4)
MCHC: 31.6 g/dL — ABNORMAL LOW (ref 32.0–36.0)
MCV: 88.2 fL (ref 79.3–98.0)
MONO#: 0.6 10*3/uL (ref 0.1–0.9)
MONO%: 10.5 % (ref 0.0–14.0)
NEUT#: 3.3 10*3/uL (ref 1.5–6.5)
NEUT%: 61.4 % (ref 39.0–75.0)
Platelets: 215 10*3/uL (ref 140–400)
RBC: 4.23 10*6/uL (ref 4.20–5.82)
RDW: 18.1 % — ABNORMAL HIGH (ref 11.0–14.6)
WBC: 5.4 10*3/uL (ref 4.0–10.3)
lymph#: 1.3 10*3/uL (ref 0.9–3.3)

## 2017-03-06 LAB — IRON AND TIBC
%SAT: 18 % — ABNORMAL LOW (ref 20–55)
Iron: 36 ug/dL — ABNORMAL LOW (ref 42–163)
TIBC: 198 ug/dL — ABNORMAL LOW (ref 202–409)
UIBC: 162 ug/dL (ref 117–376)

## 2017-03-06 LAB — FERRITIN: Ferritin: 38 ng/ml (ref 22–316)
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
## 2017-03-07 ENCOUNTER — Ambulatory Visit (HOSPITAL_COMMUNITY)
Admission: RE | Admit: 2017-03-07 | Discharge: 2017-03-07 | Disposition: A | Discharge status: Home / Self Care | Payer: Medicare Other | Source: Ambulatory Visit | Attending: General Surgery | Admitting: General Surgery

## 2017-03-07 ENCOUNTER — Other Ambulatory Visit: Payer: Medicare Other

## 2017-03-07 ENCOUNTER — Ambulatory Visit: Payer: Medicare Other | Admitting: Hematology & Oncology

## 2017-03-07 DIAGNOSIS — C4371 Malignant melanoma of right lower limb, including hip: Secondary | ICD-10-CM | POA: Diagnosis not present

## 2017-03-07 DIAGNOSIS — Z9889 Other specified postprocedural states: Secondary | ICD-10-CM | POA: Insufficient documentation

## 2017-03-07 DIAGNOSIS — C4372 Malignant melanoma of left lower limb, including hip: Secondary | ICD-10-CM

## 2017-03-07 LAB — GLUCOSE, CAPILLARY: Glucose-Capillary: 76 mg/dL (ref 65–99)

## 2017-03-07 MED ORDER — FLUDEOXYGLUCOSE F - 18 (FDG) INJECTION
7.8000 | Freq: Once | INTRAVENOUS | Status: AC | PRN
  Administered 2017-03-07: 7.8 via INTRAVENOUS

## 2017-03-10 ENCOUNTER — Other Ambulatory Visit: Payer: Self-pay | Admitting: *Deleted

## 2017-03-10 DIAGNOSIS — C4372 Malignant melanoma of left lower limb, including hip: Secondary | ICD-10-CM

## 2017-03-10 NOTE — Progress Notes (Signed)
Please let patient know we don't see any evidence of metastatic disease on PET.

## 2017-03-11 ENCOUNTER — Other Ambulatory Visit (HOSPITAL_BASED_OUTPATIENT_CLINIC_OR_DEPARTMENT_OTHER): Payer: Medicare Other

## 2017-03-11 ENCOUNTER — Ambulatory Visit (HOSPITAL_BASED_OUTPATIENT_CLINIC_OR_DEPARTMENT_OTHER): Payer: Medicare Other | Admitting: Hematology & Oncology

## 2017-03-11 ENCOUNTER — Other Ambulatory Visit: Payer: Self-pay

## 2017-03-11 VITALS — BP 112/58 | HR 59 | Temp 97.5°F | Resp 20 | Ht 65.5 in | Wt 151.5 lb

## 2017-03-11 DIAGNOSIS — C4372 Malignant melanoma of left lower limb, including hip: Secondary | ICD-10-CM

## 2017-03-11 DIAGNOSIS — D500 Iron deficiency anemia secondary to blood loss (chronic): Secondary | ICD-10-CM

## 2017-03-11 LAB — CBC WITH DIFFERENTIAL (CANCER CENTER ONLY)
BASO#: 0 10*3/uL (ref 0.0–0.2)
BASO%: 0.6 % (ref 0.0–2.0)
EOS%: 5.3 % (ref 0.0–7.0)
Eosinophils Absolute: 0.4 10*3/uL (ref 0.0–0.5)
HCT: 34 % — ABNORMAL LOW (ref 38.7–49.9)
HGB: 11 g/dL — ABNORMAL LOW (ref 13.0–17.1)
LYMPH#: 1.8 10*3/uL (ref 0.9–3.3)
LYMPH%: 24.6 % (ref 14.0–48.0)
MCH: 28.9 pg (ref 28.0–33.4)
MCHC: 32.4 g/dL (ref 32.0–35.9)
MCV: 90 fL (ref 82–98)
MONO#: 0.8 10*3/uL (ref 0.1–0.9)
MONO%: 10.8 % (ref 0.0–13.0)
NEUT#: 4.2 10*3/uL (ref 1.5–6.5)
NEUT%: 58.7 % (ref 40.0–80.0)
Platelets: 198 10*3/uL (ref 145–400)
RBC: 3.8 10*6/uL — ABNORMAL LOW (ref 4.20–5.70)
RDW: 17.9 % — ABNORMAL HIGH (ref 11.1–15.7)
WBC: 7.1 10*3/uL (ref 4.0–10.0)

## 2017-03-11 LAB — CMP (CANCER CENTER ONLY)
ALT(SGPT): 23 U/L (ref 10–47)
AST: 24 U/L (ref 11–38)
Albumin: 3.3 g/dL (ref 3.3–5.5)
Alkaline Phosphatase: 74 U/L (ref 26–84)
BUN, Bld: 44 mg/dL — ABNORMAL HIGH (ref 7–22)
CO2: 29 mEq/L (ref 18–33)
Calcium: 8.6 mg/dL (ref 8.0–10.3)
Chloride: 97 mEq/L — ABNORMAL LOW (ref 98–108)
Creat: 1.9 mg/dl — ABNORMAL HIGH (ref 0.6–1.2)
Glucose, Bld: 94 mg/dL (ref 73–118)
Potassium: 3.8 mEq/L (ref 3.3–4.7)
Sodium: 135 mEq/L (ref 128–145)
Total Bilirubin: 0.6 mg/dl (ref 0.20–1.60)
Total Protein: 6.3 g/dL — ABNORMAL LOW (ref 6.4–8.1)

## 2017-03-11 LAB — LACTATE DEHYDROGENASE: LDH: 153 U/L (ref 125–245)

## 2017-03-12 ENCOUNTER — Other Ambulatory Visit: Payer: Self-pay | Admitting: General Surgery

## 2017-03-12 ENCOUNTER — Ambulatory Visit: Payer: Medicare Other

## 2017-03-12 NOTE — Progress Notes (Signed)
START ON PATHWAY REGIMEN - Melanoma     A cycle is every 14 days:     Nivolumab   **Always confirm dose/schedule in your pharmacy ordering system**    Patient Characteristics: Resected, BRAF V600 Wild Type / BRAF V600 Results Pending or Unknown Disease Subtype: Cutaneous Current Disease Status: No Recurrence and No Distant Metastases AJCC 8 Stage Grouping: IIIC AJCC T Category: pT3b AJCC N Category: N1b AJCC M Category: M0 Mutation Status: Awaiting BRAF V600 Results Intent of Therapy: Curative Intent, Discussed with Patient

## 2017-03-12 NOTE — Progress Notes (Signed)
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 Follow-up  : I have melanoma.  HPI: [name] is well-known to me.  He is a 71 year old white male.  I have been following him because of a diagnosis of hemochromatosis.  We actually had to give him some iron because he was feeling so poorly about 3 months ago.  We saw him, we noted that he had a dark hyperpigmented lesion on the distal medial left thigh.  He had this biopsied.  Surprisingly, this turned out to be a melanoma.  He subsequently underwent a wide local excision and sentinel node biopsy on February 12, 2017.  The pathology report ([id number]) showed a nodular melanoma.  It was 2.8 mm in thickness.  There was one positive sentinel lymph node in the left inguinal region.  There was some ulceration noted.  All margins were negative.  As such, this was a stage IIIC melanoma.  He has a drainage catheter in the left inguinal region which is still draining quite a bit of serous fluid.  He did have a PET scan done.  The PET scan did not show any evidence of metastatic disease.  He has some postsurgical changes in the left upper thigh.  He was kindly referred to the [place] [facility] for consideration of adjuvant therapy.  He has had some skin cancers removed before.  There is a family history of squamous cell cancer of the skin.  He has had sunburns.  Overall, his performance status is ECOG 1.   Past Medical History:  Diagnosis Date  . Alcoholism /alcohol abuse ([place])    per family  . Allergic rhinitis   . Anxiety   . Bacterial infection   . CAD (coronary artery disease)    minimal coronary plaque in the LAD and right coronary system. 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 7-06. Last nuclear stress 11-17-06 fixed anterior/ inferior defect, no inducible  ischemia, EF 81%  . Chronic back pain    "all over back"  . Chronic neck pain   . COPD (chronic obstructive pulmonary disease) ([place])   . Depression   . Diverticulosis   . Dyspnea    with exertion  . Dysrhythmia 01-24-12   past hx. A.Fib x1 episode-responded to med.  . Falls frequently    "since 02/2013" (06/16/2013)  . Family history of cancer   . GERD (gastroesophageal reflux disease)   . Hemochromatosis    dx'd 14 yrs ago last ferritin Aug 11, 08 52 (22-322), Fe 136 ("I had 250 phlebotomies for that")  . High cholesterol    hx  . Hx of colonic polyps   . Hx of colonoscopy   . Hypertension   . Iron deficiency anemia due to chronic blood loss 12/27/2016  . Malignant melanoma of knee, left ([place])   . Migraine    "for 3 months in 2015; related to infection in my back" (06/17/2014)  . Myocardial infarction [facility]) 2006   "related to catheterization"  . Narcotic abuse ([place])    per family  . Osteoarthritis   . PONV (postoperative nausea and vomiting)   . RA (rheumatoid arthritis) ([place])   :  Past Surgical History:  Procedure Laterality Date  . ABDOMINAL ADHESION SURGERY  ~ 1968  . ANKLE RECONSTRUCTION Right 6-09   [facility]  . APPENDECTOMY  ~ 1956  .  BONE TUMOR RESECTION  ~ 1954   "taken off my mastoid"  . CARPAL TUNNEL RELEASE Right 1990's  . CATARACT EXTRACTION, BILATERAL Bilateral 01-24-12  . CORONARY ANGIOPLASTY WITH STENT PLACEMENT  2006   "while repairing 1st stent, a second area tore and they had to place 2nd stent " ?LAD & CX  . CORONARY ANGIOPLASTY WITH STENT PLACEMENT  06/17/2014  . CYST EXCISION  "several OR's"   "backX 2, back of my neck, face, inside right bicept, chest, wrist"  . FOOT SURGERY Right 11-08   for removal of bone spurs-  . HAMMER TOE SURGERY Right 07/2012   "broke 4 hammertoes"   . HARDWARE REMOVAL  03/09/2012   Procedure: HARDWARE REMOVAL;  Surgeon: Nita Sells, MD;  Location: [facility];  Service: Orthopedics;  Laterality:  Left;  Hardware Removal from Left Shoulder  . HARVEST BONE GRAFT  02/06/2012   Procedure: HARVEST ILIAC BONE GRAFT;  Surgeon: Nita Sells, MD;  Location: [facility];  Service: Orthopedics;;  bone marrow aspirqation   . INGUINAL HERNIA REPAIR Bilateral   . JOINT REPLACEMENT    . KNEE SURGERY Left ~ 2003   "6-12 months after uni knee removed synovial sack"  . LEFT HEART CATHETERIZATION WITH CORONARY ANGIOGRAM N/A 06/17/2014   PCI of diffuse severe stenosis in the proximal to mid LAD using overlapping drug-eluting stents.  . LUMBAR WOUND DEBRIDEMENT N/A 03/17/2013   Procedure: Incision and drainage of superficial lumbar wound;  Surgeon: Floyce Stakes, MD;  Location: [facility];  Service: Neurosurgery;  Laterality: N/A;  Incision and drainage of superficial lumbar wound  . MECKEL DIVERTICULUM EXCISION  ~ 1956  . MELANOMA EXCISION WITH SENTINEL LYMPH NODE BIOPSY Left 02/12/2017   WIDE LOCAL EXCISION LEFT KNEE MELANOMA, ADVANCEMENT FLAP CLOSURE, AND SENTINEL LYMPH NODE MAPPING AND BIOPSY.  [name] MELANOMA EXCISION WITH SENTINEL LYMPH NODE BIOPSY Left 02/12/2017   Procedure: WIDE LOCAL EXCISION LEFT KNEE MELANOMA, ADVANCEMENT FLAP CLOSURE, AND SENTINEL LYMPH NODE MAPPING AND BIOPSY.;  Surgeon: Stark Klein, MD;  Location: [facility];  Service: General;  Laterality: Left;  GENERAL AND LOCAL  . ORIF SHOULDER FRACTURE  02/06/2012   Procedure: OPEN REDUCTION INTERNAL FIXATION (ORIF) SHOULDER FRACTURE;  Surgeon: Nita Sells, MD;  Location: [facility];  Service: Orthopedics;  Laterality: Left;  ORIF of a Left Shoulder Fracture with  Iliac Crest Bone Graft aspiration   . POSTERIOR LUMBAR FUSION  12-10   L4-5 diskectomy w/ fusion, cage placement and rods; [place]  . POSTERIOR LUMBAR FUSION 4 LEVEL N/A 03/02/2013   Procedure: Lumbar One to Sacral One Posterior lumbar interbody fusion;  Surgeon: Floyce Stakes, MD;  Location: [facility];  Service: Neurosurgery;  Laterality: N/A;  L1 to S1  Posterior lumbar interbody fusion  . REPLACEMENT UNICONDYLAR JOINT KNEE Left ~ 2003   "~ 6 months after total knee replaced"  . SHOULDER ARTHROSCOPY Left ~ 2004 X 2   "@ [facility]; left bone splinter in & had to clean it out"  . TOTAL ANKLE REPLACEMENT Right 2008   at [facility]  . TOTAL KNEE ARTHROPLASTY Bilateral 2002  . TOTAL SHOULDER REPLACEMENT Left 2006  . TOTAL SHOULDER REPLACEMENT Right ~ 2007   Dr. Marlou Sa  :   Current Outpatient Medications:  .  albuterol (PROVENTIL HFA;VENTOLIN HFA) 108 (90 Base) MCG/ACT inhaler, Inhale 1-2 puffs into the lungs every 4 (four) hours as needed for wheezing or shortness of breath., Disp: 1 Inhaler, Rfl: 1 .  alfuzosin (UROXATRAL) 10 MG 24 hr tablet, Take 10 mg by mouth at bedtime. , Disp: , Rfl: 0 .  ALPRAZolam (XANAX) 0.5 MG tablet, Take 1 tablet (0.5 mg total) by mouth 2 (two) times daily as needed. for anxiety, Disp: 60 tablet, Rfl: 2 .  Cholecalciferol (VITAMIN D3) 2000 UNITS capsule, Take 1 capsule (2,000 Units total) by mouth daily., Disp: 100 capsule, Rfl: 3 .  clopidogrel (PLAVIX) 75 MG tablet, TAKE 1 TABLET BY MOUTH EVERY DAY WITH BREAKFAST, Disp: 90 tablet, Rfl: 3 .  COLCRYS 0.6 MG tablet, Take 1 tablet by mouth every evening. , Disp: , Rfl: 2 .  FLUoxetine (PROZAC) 40 MG capsule, Take 1 capsule (40 mg total) by mouth 2 (two) times daily., Disp: 180 capsule, Rfl: 2 .  fluticasone (FLONASE) 50 MCG/ACT nasal spray, INSTILL 2 SPRAYS IN EACH NOSTRIL EVERY DAY, Disp: 16 g, Rfl: 11 .  gabapentin (NEURONTIN) 300 MG capsule, Take 300 mg by mouth 2 (two) times daily., Disp: , Rfl:  .  HYDROmorphone (DILAUDID) 2 MG tablet, Take 1-2 tablets (2-4 mg total) by mouth every 8 (eight) hours as needed for severe pain. It is a 90 days supply, Disp: 90 tablet, Rfl: 0 .  losartan-hydrochlorothiazide (HYZAAR) 50-12.5 MG tablet, TAKE 1 TABLET BY MOUTH DAILY, Disp: 90 tablet, Rfl: 0 .  Melatonin 5 MG TABS, Take 5 mg by mouth at bedtime., Disp: , Rfl:  .  metoprolol tartrate  (LOPRESSOR) 25 MG tablet, Take 1 tablet (25 mg total) by mouth 2 (two) times daily., Disp: 180 tablet, Rfl: 1 .  Omega-3 Fatty Acids (FISH OIL OMEGA-3) 1000 MG CAPS, Take 1,000 mg by mouth daily. , Disp: , Rfl:  .  ondansetron (ZOFRAN) 4 MG tablet, Take 1 tablet (4 mg total) by mouth every 8 (eight) hours as needed for nausea or vomiting., Disp: 20 tablet, Rfl: 1 .  pantoprazole (PROTONIX) 40 MG tablet, Take 1 tablet (40 mg total) by mouth every evening., Disp: 90 tablet, Rfl: 1 .  pravastatin (PRAVACHOL) 20 MG tablet, Take 1 tablet (20 mg total) by mouth daily. (Patient taking differently: Take 20 mg by mouth every evening. ), Disp: 90 tablet, Rfl: 1 .  predniSONE (DELTASONE) 5 MG tablet, Take 5 mg by mouth daily with breakfast., Disp: , Rfl:  .  tadalafil (CIALIS) 5 MG tablet, Take 5 mg by mouth as needed., Disp: , Rfl:  .  vitamin B-12 (CYANOCOBALAMIN) 1000 MCG tablet, Take 1,000 mcg by mouth daily. , Disp: , Rfl: :  :  Allergies  Allergen Reactions  . Cefepime Hives and Shortness Of Breath  . Morphine And Related Shortness Of Breath, Nausea And Vomiting, Swelling and Other (See Comments)    Agitation, tolerates dilaudid  . Penicillins Hives, Shortness Of Breath and Rash    Has patient had a PCN reaction causing immediate rash, facial/tongue/throat swelling, SOB or lightheadedness with hypotension: Yes Has patient had a PCN reaction causing severe rash involving mucus membranes or skin necrosis: Yes Has patient had a PCN reaction that required hospitalization No Has patient had a PCN reaction occurring within the last 10 years: No If all of the above answers are "NO", then may proceed with Cephalosporin use.   :  Family History  Problem Relation Age of Onset  . Uterine cancer Mother        uterine vs. cervix? dx 30s; deceased 44  . Macular degeneration Mother   . Other Mother        ankle edema  .  Lung cancer Mother   . Melanoma Mother        multiple spots on legs  . Cancer -  Lung Mother        smoker  . Coronary artery disease Father   . Hypertension Father   . Prostate cancer Father   . Colon polyps Father   . Cancer - Prostate Father 13       deceased 76  . Heart attack Brother   . Hyperlipidemia Brother   . Other Brother        Schizophrenic  . Thyroid disease Sister   . Hemochromatosis Sister   . Rheum arthritis Sister   . Coronary artery disease Maternal Aunt   . Heart attack Maternal Aunt   . Cancer Maternal Grandmother        unk. type; deceased 11s  . Diabetes Neg Hx   . Colon cancer Neg Hx   . Esophageal cancer Neg Hx   . Rectal cancer Neg Hx   . Stomach cancer Neg Hx   :  Social History   Socioeconomic History  . Marital status: Divorced    Spouse name: Not on file  . Number of children: 2  . Years of education: 58  . Highest education level: Not on file  Social Needs  . Financial resource strain: Not on file  . Food insecurity - worry: Not on file  . Food insecurity - inability: Not on file  . Transportation needs - medical: Not on file  . Transportation needs - non-medical: Not on file  Occupational History  . 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    Comment: self employed  Tobacco Use  . Smoking status: Never Smoker  . Smokeless tobacco: Never Used  Substance and Sexual Activity  . Alcohol use: No    Alcohol/week: 0.0 oz    Comment: "I do not drink anymore"  . Drug use: No  . Sexual activity: Yes    Partners: Female  Other Topics Concern  . Not on file  Social History Narrative   [employer] [place]. Married 1972-40yr seperated/divorced. Engaged April '11. 1 son '74; step-daughter '70. 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 During '11 discovered an [employer]- $500,000+ loss. Is handling this OK - will keep the business running.  :  Pertinent items are noted in HPI.  Exam:  Well-developed well-nourished white male.  Vital signs are temperature 97.5.  Pulse 59.  Blood pressure 112/58.  Weight is 152 pounds.  Head neck exam shows no ocular or oral lesions.  He has no palpable cervical or supraclavicular lymph nodes.  Lungs are clear bilaterally.  Cardiac exam regular rate and rhythm with a normal S1 and S2.  There are no murmurs, rubs or bruits.  Abdomen is soft.  He has good bowel sounds.  He has no fluid wave.  He has the left inguinal lymphadenectomy scar.  This is healed.  There is no palpable liver or spleen tip.  Back exam shows no tenderness over the spine, ribs or hips.  He has a large laminectomy scar in the thoracic and lumbar spine.  Extremities shows a wide local excision scar in the distal medial left thigh.  This is healing.  He has a drainage catheter in the upper anterior left thigh.  There may be some slight swelling in the left leg.  Right leg is  unremarkable.  Skin exam shows some hyperpigmented lesions but nothing that appears suspicious.  Neurological exam shows no obvious neurological deficits. @  Recent Labs    03/11/17 0808  WBC 7.1  HGB 11.0*  HCT 34.0*  PLT 198   Recent Labs    03/11/17 0808  NA 135  K 3.8  CL 97*  CO2 29  GLUCOSE 94  BUN 44*  CREATININE 1.9*  CALCIUM 8.6    Blood smear review: None  Pathology: See above    Assessment and Plan: [name] is a 70 year old white male.  He has hemochromatosis.  He now has melanoma.  This is stage III melanoma.  He had one positive sentinel lymph node.  This is a nodular melanoma.  There was some ulceration noted.  This clearly is a high risk melanoma.  As such, I think that he would be a good candidate for adjuvant therapy.  I talked to he and his wife for about an hour.  I think that he would be a good candidate for Nivolumab.  I think this would be the standard of care for adjuvant melanoma.  He will need to have a Port-A-Cath placed.  We will have to get this all set  up.  I gave the information sheets about Nivolumab.  I went over the side effects of Nivolumab.  Given that this is adjuvant therapy, our goal of care is clearly cure.  I would like to try to get started in a few weeks.  He still has his drainage catheter and that we need to make sure he gets removed.  We will plan to see him back at the start of his second cycle of treatment.  We might be able to get away with doing monthly Nivolumab.  He will need 1 year of therapy.

## 2017-03-19 ENCOUNTER — Telehealth: Payer: Self-pay | Admitting: Internal Medicine

## 2017-03-19 ENCOUNTER — Ambulatory Visit: Payer: Medicare Other | Admitting: Internal Medicine

## 2017-03-19 MED ORDER — HYDROMORPHONE HCL 2 MG PO TABS: 2.0000 mg | ORAL_TABLET | Freq: Three times a day (TID) | ORAL | 0 refills | Status: DC | PRN

## 2017-03-19 NOTE — Telephone Encounter (Signed)
Check [place] registry last filled 03/03/2017...[name]

## 2017-03-19 NOTE — Telephone Encounter (Signed)
Pt needs a refil of his  HYDROmorphone (DILAUDID) 2 MG tablet   Please advise

## 2017-03-20 NOTE — Telephone Encounter (Signed)
Called pt no answer LMOM MD ok refill has been sent to [facility]...[name]

## 2017-03-21 ENCOUNTER — Encounter (HOSPITAL_BASED_OUTPATIENT_CLINIC_OR_DEPARTMENT_OTHER): Payer: Self-pay | Admitting: *Deleted

## 2017-03-21 ENCOUNTER — Telehealth: Payer: Self-pay | Admitting: Genetics

## 2017-03-21 NOTE — Telephone Encounter (Signed)
03/21/17 @ 2:11 pm called patient and left a message to call to confirm appointment for 05/06/17 @ 10:00 am with Ferol Luz

## 2017-03-24 ENCOUNTER — Ambulatory Visit: Payer: Medicare Other | Admitting: Hematology & Oncology

## 2017-03-24 ENCOUNTER — Other Ambulatory Visit: Payer: Medicare Other

## 2017-03-25 ENCOUNTER — Telehealth: Payer: Self-pay | Admitting: Genetics

## 2017-03-25 DIAGNOSIS — M150 Primary generalized (osteo)arthritis: Secondary | ICD-10-CM | POA: Diagnosis not present

## 2017-03-25 DIAGNOSIS — M1120 Other chondrocalcinosis, unspecified site: Secondary | ICD-10-CM | POA: Diagnosis not present

## 2017-03-25 DIAGNOSIS — M25531 Pain in right wrist: Secondary | ICD-10-CM | POA: Diagnosis not present

## 2017-03-25 DIAGNOSIS — C4372 Malignant melanoma of left lower limb, including hip: Secondary | ICD-10-CM | POA: Diagnosis not present

## 2017-03-25 DIAGNOSIS — Z6823 Body mass index (BMI) 23.0-23.9, adult: Secondary | ICD-10-CM | POA: Diagnosis not present

## 2017-03-25 DIAGNOSIS — M5136 Other intervertebral disc degeneration, lumbar region: Secondary | ICD-10-CM | POA: Diagnosis not present

## 2017-03-25 DIAGNOSIS — M0579 Rheumatoid arthritis with rheumatoid factor of multiple sites without organ or systems involvement: Secondary | ICD-10-CM | POA: Diagnosis not present

## 2017-03-25 DIAGNOSIS — D6489 Other specified anemias: Secondary | ICD-10-CM | POA: Diagnosis not present

## 2017-03-25 DIAGNOSIS — R0600 Dyspnea, unspecified: Secondary | ICD-10-CM | POA: Diagnosis not present

## 2017-03-25 NOTE — Telephone Encounter (Signed)
03/25/17 @ 11:38 am left another message with patient to return call to confirm appointment with Ferol Luz for Genetic counseling on 05/06/17 @ 10:00 am

## 2017-03-26 NOTE — Anesthesia Preprocedure Evaluation (Signed)
Anesthesia Evaluation  Patient identified by MRN, date of birth, ID band Patient awake    Reviewed: Allergy & Precautions, H&P , NPO status , Patient's Chart, lab work & pertinent test results, reviewed documented beta blocker date and time   History of Anesthesia Complications (+) PONV and history of anesthetic complications  Airway Mallampati: II  TM Distance: >3 FB Neck ROM: Full    Dental  (+) Teeth Intact, Dental Advisory Given   Pulmonary COPD,    breath sounds clear to auscultation       Cardiovascular hypertension, Pt. on medications and Pt. on home beta blockers + CAD and + Past MI   Rhythm:Regular Rate:Normal     Neuro/Psych Anxiety Depression  Neuromuscular disease    GI/Hepatic GERD  Medicated,(+)     substance abuse  alcohol use,   Endo/Other    Renal/GU      Musculoskeletal  (+) Arthritis , Osteoarthritis,    Abdominal   Peds  Hematology  (+) anemia ,   Anesthesia Other Findings Melanoma  Reproductive/Obstetrics                             Anesthesia Physical  Anesthesia Plan  ASA: III  Anesthesia Plan: General   Post-op Pain Management:    Induction: Intravenous  PONV Risk Score and Plan: 3 and Ondansetron, Dexamethasone, Midazolam, Treatment may vary due to age or medical condition and Propofol infusion  Airway Management Planned: LMA  Additional Equipment:   Intra-op Plan:   Post-operative Plan: Extubation in OR  Informed Consent: I have reviewed the patients History and Physical, chart, labs and discussed the procedure including the risks, benefits and alternatives for the proposed anesthesia with the patient or authorized representative who has indicated his/her understanding and acceptance.   Dental advisory given  Plan Discussed with: CRNA and Anesthesiologist  Anesthesia Plan Comments: (CAD S/P PTCA 2006, (-) nuclear stress test 2012 H/O  paroxysmal Afib 2012, EF 60% on TEE Hypokalemia  Plan GA with oral ETT)        Anesthesia Quick Evaluation                                   Anesthesia Evaluation  Patient identified by MRN, date of birth, ID band Patient awake    Reviewed: Allergy & Precautions, H&P , NPO status , Patient's Chart, lab work & pertinent test results, reviewed documented beta blocker date and time   History of Anesthesia Complications (+) PONV and history of anesthetic complications  Airway Mallampati: II  TM Distance: >3 FB Neck ROM: Full    Dental  (+) Teeth Intact, Dental Advisory Given   Pulmonary COPD,    breath sounds clear to auscultation       Cardiovascular hypertension, Pt. on medications and Pt. on home beta blockers  Rhythm:Regular Rate:Normal     Neuro/Psych Anxiety Depression  Neuromuscular disease    GI/Hepatic GERD  ,  Endo/Other    Renal/GU      Musculoskeletal  (+) Arthritis ,   Abdominal   Peds  Hematology  (+) anemia ,   Anesthesia Other Findings Melanoma  Reproductive/Obstetrics                             Anesthesia Physical  Anesthesia Plan  ASA: III  Anesthesia Plan: General  Post-op Pain Management:    Induction: Intravenous  PONV Risk Score and Plan: 3 and Ondansetron, Dexamethasone and Midazolam  Airway Management Planned: Oral ETT  Additional Equipment:   Intra-op Plan:   Post-operative Plan: Extubation in OR  Informed Consent: I have reviewed the patients History and Physical, chart, labs and discussed the procedure including the risks, benefits and alternatives for the proposed anesthesia with the patient or authorized representative who has indicated his/her understanding and acceptance.   Dental advisory given  Plan Discussed with: CRNA and Anesthesiologist  Anesthesia Plan Comments: (CAD S/P PTCA 2006, (-) nuclear stress test 2012 H/O paroxysmal Afib 2012, EF 60% on  TEE Hypokalemia  Plan GA with oral ETT)        Anesthesia Quick Evaluation

## 2017-03-27 ENCOUNTER — Other Ambulatory Visit: Payer: Self-pay

## 2017-03-27 ENCOUNTER — Encounter (HOSPITAL_BASED_OUTPATIENT_CLINIC_OR_DEPARTMENT_OTHER)
Admission: RE | Disposition: A | Discharge status: Home / Self Care | Payer: Self-pay | Source: Ambulatory Visit | Attending: General Surgery

## 2017-03-27 ENCOUNTER — Ambulatory Visit (HOSPITAL_BASED_OUTPATIENT_CLINIC_OR_DEPARTMENT_OTHER): Payer: Medicare Other | Admitting: Anesthesiology

## 2017-03-27 ENCOUNTER — Ambulatory Visit (HOSPITAL_BASED_OUTPATIENT_CLINIC_OR_DEPARTMENT_OTHER)
Admission: RE | Admit: 2017-03-27 | Discharge: 2017-03-27 | Disposition: A | Discharge status: Home / Self Care | Payer: Medicare Other | Source: Ambulatory Visit | Attending: General Surgery | Admitting: General Surgery

## 2017-03-27 ENCOUNTER — Ambulatory Visit (HOSPITAL_COMMUNITY): Payer: Medicare Other

## 2017-03-27 ENCOUNTER — Encounter (HOSPITAL_COMMUNITY): Payer: Self-pay

## 2017-03-27 ENCOUNTER — Encounter (HOSPITAL_BASED_OUTPATIENT_CLINIC_OR_DEPARTMENT_OTHER): Payer: Self-pay | Admitting: *Deleted

## 2017-03-27 DIAGNOSIS — M549 Dorsalgia, unspecified: Secondary | ICD-10-CM | POA: Insufficient documentation

## 2017-03-27 DIAGNOSIS — E7800 Pure hypercholesterolemia, unspecified: Secondary | ICD-10-CM | POA: Insufficient documentation

## 2017-03-27 DIAGNOSIS — M1990 Unspecified osteoarthritis, unspecified site: Secondary | ICD-10-CM | POA: Insufficient documentation

## 2017-03-27 DIAGNOSIS — Z452 Encounter for adjustment and management of vascular access device: Secondary | ICD-10-CM | POA: Diagnosis not present

## 2017-03-27 DIAGNOSIS — J90 Pleural effusion, not elsewhere classified: Secondary | ICD-10-CM | POA: Diagnosis not present

## 2017-03-27 DIAGNOSIS — M069 Rheumatoid arthritis, unspecified: Secondary | ICD-10-CM | POA: Insufficient documentation

## 2017-03-27 DIAGNOSIS — D1724 Benign lipomatous neoplasm of skin and subcutaneous tissue of left leg: Secondary | ICD-10-CM | POA: Diagnosis not present

## 2017-03-27 DIAGNOSIS — Z880 Allergy status to penicillin: Secondary | ICD-10-CM | POA: Diagnosis not present

## 2017-03-27 DIAGNOSIS — I10 Essential (primary) hypertension: Secondary | ICD-10-CM | POA: Insufficient documentation

## 2017-03-27 DIAGNOSIS — I2510 Atherosclerotic heart disease of native coronary artery without angina pectoris: Secondary | ICD-10-CM | POA: Diagnosis not present

## 2017-03-27 DIAGNOSIS — Z419 Encounter for procedure for purposes other than remedying health state, unspecified: Secondary | ICD-10-CM

## 2017-03-27 DIAGNOSIS — F419 Anxiety disorder, unspecified: Secondary | ICD-10-CM | POA: Diagnosis not present

## 2017-03-27 DIAGNOSIS — Z79899 Other long term (current) drug therapy: Secondary | ICD-10-CM | POA: Diagnosis not present

## 2017-03-27 DIAGNOSIS — C4372 Malignant melanoma of left lower limb, including hip: Secondary | ICD-10-CM | POA: Insufficient documentation

## 2017-03-27 DIAGNOSIS — Z808 Family history of malignant neoplasm of other organs or systems: Secondary | ICD-10-CM | POA: Diagnosis not present

## 2017-03-27 DIAGNOSIS — K219 Gastro-esophageal reflux disease without esophagitis: Secondary | ICD-10-CM | POA: Insufficient documentation

## 2017-03-27 DIAGNOSIS — G8929 Other chronic pain: Secondary | ICD-10-CM | POA: Diagnosis not present

## 2017-03-27 DIAGNOSIS — F329 Major depressive disorder, single episode, unspecified: Secondary | ICD-10-CM | POA: Diagnosis not present

## 2017-03-27 DIAGNOSIS — I4891 Unspecified atrial fibrillation: Secondary | ICD-10-CM | POA: Diagnosis not present

## 2017-03-27 DIAGNOSIS — C439 Malignant melanoma of skin, unspecified: Secondary | ICD-10-CM

## 2017-03-27 DIAGNOSIS — Z888 Allergy status to other drugs, medicaments and biological substances status: Secondary | ICD-10-CM | POA: Diagnosis not present

## 2017-03-27 DIAGNOSIS — Z885 Allergy status to narcotic agent status: Secondary | ICD-10-CM | POA: Diagnosis not present

## 2017-03-27 DIAGNOSIS — J449 Chronic obstructive pulmonary disease, unspecified: Secondary | ICD-10-CM | POA: Insufficient documentation

## 2017-03-27 DIAGNOSIS — I252 Old myocardial infarction: Secondary | ICD-10-CM | POA: Insufficient documentation

## 2017-03-27 HISTORY — PX: ASPIRATION OF ABSCESS: SHX6754

## 2017-03-27 HISTORY — PX: PORTACATH PLACEMENT: SHX2246

## 2017-03-27 SURGERY — INSERTION, TUNNELED CENTRAL VENOUS DEVICE, WITH PORT
Anesthesia: General | Site: Groin | Laterality: Left

## 2017-03-27 MED ORDER — FENTANYL CITRATE (PF) 100 MCG/2ML IJ SOLN
INTRAMUSCULAR | Status: DC | PRN
  Administered 2017-03-27: 50 ug via INTRAVENOUS

## 2017-03-27 MED ORDER — MIDAZOLAM HCL 2 MG/2ML IJ SOLN
INTRAMUSCULAR | Status: AC
  Filled 2017-03-27: qty 2

## 2017-03-27 MED ORDER — LIDOCAINE HCL 1 % IJ SOLN
INTRAMUSCULAR | Status: DC | PRN
  Administered 2017-03-27: 6 mL via INTRAMUSCULAR

## 2017-03-27 MED ORDER — SCOPOLAMINE 1 MG/3DAYS TD PT72: 1.0000 | MEDICATED_PATCH | Freq: Once | TRANSDERMAL | Status: DC | PRN

## 2017-03-27 MED ORDER — ACETAMINOPHEN 325 MG PO TABS: 325.0000 mg | ORAL_TABLET | ORAL | Status: DC | PRN

## 2017-03-27 MED ORDER — PROPOFOL 10 MG/ML IV BOLUS
INTRAVENOUS | Status: DC | PRN
  Administered 2017-03-27: 200 mg via INTRAVENOUS

## 2017-03-27 MED ORDER — CIPROFLOXACIN IN D5W 400 MG/200ML IV SOLN
INTRAVENOUS | Status: AC
  Filled 2017-03-27: qty 200

## 2017-03-27 MED ORDER — LIDOCAINE 2% (20 MG/ML) 5 ML SYRINGE
INTRAMUSCULAR | Status: DC | PRN
  Administered 2017-03-27: 60 mg via INTRAVENOUS

## 2017-03-27 MED ORDER — DEXAMETHASONE SODIUM PHOSPHATE 4 MG/ML IJ SOLN
INTRAMUSCULAR | Status: DC | PRN
  Administered 2017-03-27: 10 mg via INTRAVENOUS

## 2017-03-27 MED ORDER — CIPROFLOXACIN IN D5W 400 MG/200ML IV SOLN
400.0000 mg | INTRAVENOUS | Status: AC
  Administered 2017-03-27: 400 mg via INTRAVENOUS

## 2017-03-27 MED ORDER — ONDANSETRON HCL 4 MG/2ML IJ SOLN
INTRAMUSCULAR | Status: AC
  Filled 2017-03-27: qty 4

## 2017-03-27 MED ORDER — CHLORHEXIDINE GLUCONATE CLOTH 2 % EX PADS: 6.0000 | MEDICATED_PAD | Freq: Once | CUTANEOUS | Status: DC

## 2017-03-27 MED ORDER — MIDAZOLAM HCL 5 MG/5ML IJ SOLN
INTRAMUSCULAR | Status: DC | PRN
  Administered 2017-03-27: 2 mg via INTRAVENOUS

## 2017-03-27 MED ORDER — KETOROLAC TROMETHAMINE 30 MG/ML IJ SOLN: 30.0000 mg | Freq: Once | INTRAMUSCULAR | Status: DC | PRN

## 2017-03-27 MED ORDER — ONDANSETRON HCL 4 MG/2ML IJ SOLN
INTRAMUSCULAR | Status: DC | PRN
  Administered 2017-03-27: 4 mg via INTRAVENOUS

## 2017-03-27 MED ORDER — LACTATED RINGERS IV SOLN
INTRAVENOUS | Status: DC
  Administered 2017-03-27: 10:00:00 via INTRAVENOUS

## 2017-03-27 MED ORDER — ACETAMINOPHEN 160 MG/5ML PO SOLN: 325.0000 mg | ORAL | Status: DC | PRN

## 2017-03-27 MED ORDER — OXYCODONE HCL 5 MG/5ML PO SOLN: 5.0000 mg | Freq: Once | ORAL | Status: DC | PRN

## 2017-03-27 MED ORDER — PROPOFOL 10 MG/ML IV BOLUS
INTRAVENOUS | Status: AC
  Filled 2017-03-27: qty 20

## 2017-03-27 MED ORDER — EPHEDRINE SULFATE-NACL 50-0.9 MG/10ML-% IV SOSY
PREFILLED_SYRINGE | INTRAVENOUS | Status: DC | PRN
Start: 2017-03-27 — End: 2017-03-27
  Administered 2017-03-27: 10 mg via INTRAVENOUS
  Administered 2017-03-27: 20 mg via INTRAVENOUS

## 2017-03-27 MED ORDER — HYDROMORPHONE HCL 2 MG PO TABS: 2.0000 mg | ORAL_TABLET | Freq: Three times a day (TID) | ORAL | 0 refills | Status: DC | PRN

## 2017-03-27 MED ORDER — LIDOCAINE 2% (20 MG/ML) 5 ML SYRINGE
INTRAMUSCULAR | Status: AC
  Filled 2017-03-27: qty 5

## 2017-03-27 MED ORDER — OXYCODONE HCL 5 MG PO TABS: 5.0000 mg | ORAL_TABLET | Freq: Once | ORAL | Status: DC | PRN

## 2017-03-27 MED ORDER — DEXAMETHASONE SODIUM PHOSPHATE 10 MG/ML IJ SOLN
INTRAMUSCULAR | Status: AC
  Filled 2017-03-27: qty 1

## 2017-03-27 MED ORDER — MIDAZOLAM HCL 2 MG/2ML IJ SOLN: 1.0000 mg | INTRAMUSCULAR | Status: DC | PRN

## 2017-03-27 MED ORDER — FENTANYL CITRATE (PF) 100 MCG/2ML IJ SOLN: 50.0000 ug | INTRAMUSCULAR | Status: DC | PRN

## 2017-03-27 MED ORDER — FENTANYL CITRATE (PF) 100 MCG/2ML IJ SOLN: 25.0000 ug | INTRAMUSCULAR | Status: DC | PRN

## 2017-03-27 MED ORDER — HEPARIN (PORCINE) IN NACL 2-0.9 UNIT/ML-% IJ SOLN
INTRAMUSCULAR | Status: AC | PRN
  Administered 2017-03-27: 500 mL via INTRAVENOUS

## 2017-03-27 MED ORDER — MEPERIDINE HCL 25 MG/ML IJ SOLN: 6.2500 mg | INTRAMUSCULAR | Status: DC | PRN

## 2017-03-27 MED ORDER — FENTANYL CITRATE (PF) 100 MCG/2ML IJ SOLN
INTRAMUSCULAR | Status: AC
  Filled 2017-03-27: qty 2

## 2017-03-27 MED ORDER — HEPARIN SOD (PORK) LOCK FLUSH 100 UNIT/ML IV SOLN
INTRAVENOUS | Status: DC | PRN
  Administered 2017-03-27: 500 [IU] via INTRAVENOUS

## 2017-03-27 MED ORDER — ONDANSETRON HCL 4 MG/2ML IJ SOLN: 4.0000 mg | Freq: Once | INTRAMUSCULAR | Status: DC | PRN

## 2017-03-27 SURGICAL SUPPLY — 41 items
ADH SKN CLS APL DERMABOND .7 (GAUZE/BANDAGES/DRESSINGS) ×2
BAG DECANTER FOR FLEXI CONT (MISCELLANEOUS) ×4 IMPLANT
BLADE HEX COATED 2.75 (ELECTRODE) ×4 IMPLANT
BLADE SURG 11 STRL SS (BLADE) ×4 IMPLANT
BLADE SURG 15 STRL LF DISP TIS (BLADE) ×2 IMPLANT
BLADE SURG 15 STRL SS (BLADE) ×4
CHLORAPREP W/TINT 26ML (MISCELLANEOUS) ×4 IMPLANT
COVER BACK TABLE 60X90IN (DRAPES) ×4 IMPLANT
COVER MAYO STAND STRL (DRAPES) ×4 IMPLANT
DERMABOND ADVANCED (GAUZE/BANDAGES/DRESSINGS) ×2
DERMABOND ADVANCED .7 DNX12 (GAUZE/BANDAGES/DRESSINGS) ×2 IMPLANT
DRAPE C-ARM 42X72 X-RAY (DRAPES) ×4 IMPLANT
DRAPE LAPAROTOMY TRNSV 102X78 (DRAPE) ×4 IMPLANT
DRAPE UTILITY XL STRL (DRAPES) ×4 IMPLANT
ELECT REM PT RETURN 9FT ADLT (ELECTROSURGICAL) ×4
ELECTRODE REM PT RTRN 9FT ADLT (ELECTROSURGICAL) ×2 IMPLANT
GAUZE SPONGE 4X4 12PLY STRL (GAUZE/BANDAGES/DRESSINGS) ×2 IMPLANT
GAUZE SPONGE 4X4 12PLY STRL LF (GAUZE/BANDAGES/DRESSINGS) IMPLANT
GLOVE BIO SURGEON STRL SZ 6 (GLOVE) ×4 IMPLANT
GLOVE BIOGEL PI IND STRL 6.5 (GLOVE) ×2 IMPLANT
GLOVE BIOGEL PI INDICATOR 6.5 (GLOVE) ×2
GOWN STRL REUS W/ TWL LRG LVL3 (GOWN DISPOSABLE) ×2 IMPLANT
GOWN STRL REUS W/TWL 2XL LVL3 (GOWN DISPOSABLE) ×4 IMPLANT
GOWN STRL REUS W/TWL LRG LVL3 (GOWN DISPOSABLE) ×4
KIT PORT POWER 8FR ISP CVUE (Miscellaneous) ×3 IMPLANT
NDL HYPO 25X1 1.5 SAFETY (NEEDLE) ×1 IMPLANT
NEEDLE HYPO 25X1 1.5 SAFETY (NEEDLE) ×4 IMPLANT
PACK BASIN DAY SURGERY FS (CUSTOM PROCEDURE TRAY) ×4 IMPLANT
PENCIL BUTTON HOLSTER BLD 10FT (ELECTRODE) ×6 IMPLANT
SLEEVE SCD COMPRESS KNEE MED (MISCELLANEOUS) ×4 IMPLANT
SUT MNCRL AB 4-0 PS2 18 (SUTURE) ×4 IMPLANT
SUT PROLENE 2 0 SH DA (SUTURE) ×8 IMPLANT
SUT VIC AB 3-0 SH 27 (SUTURE) ×4
SUT VIC AB 3-0 SH 27X BRD (SUTURE) ×2 IMPLANT
SUT VICRYL 3-0 CR8 SH (SUTURE) ×3 IMPLANT
SYR 10ML LL (SYRINGE) ×4 IMPLANT
SYR 5ML LUER SLIP (SYRINGE) ×4 IMPLANT
SYR CONTROL 10ML LL (SYRINGE) ×4 IMPLANT
TAPE CLOTH SURG 4X10 WHT LF (GAUZE/BANDAGES/DRESSINGS) ×2 IMPLANT
TOWEL OR 17X24 6PK STRL BLUE (TOWEL DISPOSABLE) ×4 IMPLANT
TOWEL OR NON WOVEN STRL DISP B (DISPOSABLE) ×4 IMPLANT

## 2017-03-27 NOTE — H&P (Signed)
[name] is an 71 y.o. male.   Chief Complaint: melanoma left medial knee HPI: Pt is a 71 yo M with melanoma.  His SLN was positive and he will need IV immunotherapy.  Port placement requested.    Past Medical History:  Diagnosis Date  . Alcoholism /alcohol abuse ([place])    per family  . Allergic rhinitis   . Anxiety   . Bacterial infection   . CAD (coronary artery disease)    minimal coronary plaque in the LAD and right coronary system. 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 7-06. Last nuclear stress 11-17-06 fixed anterior/ inferior defect, no inducible ischemia, EF 81%  . Chronic back pain    "all over back"  . Chronic neck pain   . Depression   . Diverticulosis   . Dyspnea    with exertion  . Dysrhythmia 01-24-12   past hx. A.Fib x1 episode-responded to med.  . Falls frequently    "since 02/2013" (06/16/2013)  . Family history of cancer   . GERD (gastroesophageal reflux disease)   . Hemochromatosis    dx'd 14 yrs ago last ferritin Aug 11, 08 52 (22-322), Fe 136 ("I had 250 phlebotomies for that")  . High cholesterol    hx  . Hx of colonic polyps   . Hx of colonoscopy   . Hypertension   . Iron deficiency anemia due to chronic blood loss 12/27/2016  . Malignant melanoma of knee, left ([place])   . Myocardial infarction [facility]) 2006   "related to catheterization"  . Narcotic abuse ([place])    per family  . Osteoarthritis   . PONV (postoperative nausea and vomiting)   . RA (rheumatoid arthritis) ([place])     Past Surgical History:  Procedure Laterality Date  . ABDOMINAL ADHESION SURGERY  ~ 1968  . ANKLE RECONSTRUCTION Right 6-09   [facility]  . APPENDECTOMY  ~ 1956  . BONE TUMOR RESECTION  ~ 1954   "taken off my mastoid"  . CARPAL TUNNEL RELEASE Right 1990's  . CATARACT EXTRACTION, BILATERAL Bilateral 01-24-12  . CORONARY ANGIOPLASTY WITH STENT PLACEMENT  2006   "while repairing 1st stent, a second area tore  and they had to place 2nd stent " ?LAD & CX  . CORONARY ANGIOPLASTY WITH STENT PLACEMENT  06/17/2014  . CYST EXCISION  "several OR's"   "backX 2, back of my neck, face, inside right bicept, chest, wrist"  . FOOT SURGERY Right 11-08   for removal of bone spurs-  . HAMMER TOE SURGERY Right 07/2012   "broke 4 hammertoes"   . HARDWARE REMOVAL  03/09/2012   Procedure: HARDWARE REMOVAL;  Surgeon: Nita Sells, MD;  Location: [place];  Service: Orthopedics;  Laterality: Left;  Hardware Removal from Left Shoulder  . HARVEST BONE GRAFT  02/06/2012   Procedure: HARVEST ILIAC BONE GRAFT;  Surgeon: Nita Sells, MD;  Location: [facility];  Service: Orthopedics;;  bone marrow aspirqation   . INGUINAL HERNIA REPAIR Bilateral   . JOINT REPLACEMENT    . KNEE SURGERY Left ~ 2003   "6-12 months after uni knee removed synovial sack"  . LEFT HEART CATHETERIZATION WITH CORONARY ANGIOGRAM N/A 06/17/2014   PCI of diffuse severe stenosis in the proximal to mid LAD using overlapping drug-eluting stents.  . LUMBAR WOUND DEBRIDEMENT N/A 03/17/2013   Procedure: Incision and drainage of superficial lumbar wound;  Surgeon: Floyce Stakes, MD;  Location: [facility]  [facility]UR[facility];  Service: Neurosurgery;  Laterality: N/A;  Incision and drainage of superficial lumbar wound  . MECKEL DIVERTICULUM EXCISION  ~ 1956  . MELANOMA EXCISION WITH SENTINEL LYMPH NODE BIOPSY Left 02/12/2017   WIDE LOCAL EXCISION LEFT KNEE MELANOMA, ADVANCEMENT FLAP CLOSURE, AND SENTINEL LYMPH NODE MAPPING AND BIOPSY.  [name] MELANOMA EXCISION WITH SENTINEL LYMPH NODE BIOPSY Left 02/12/2017   Procedure: WIDE LOCAL EXCISION LEFT KNEE MELANOMA, ADVANCEMENT FLAP CLOSURE, AND SENTINEL LYMPH NODE MAPPING AND BIOPSY.;  Surgeon: Stark Klein, MD;  Location: [place];  Service: General;  Laterality: Left;  GENERAL AND LOCAL  . ORIF SHOULDER FRACTURE  02/06/2012   Procedure: OPEN REDUCTION INTERNAL FIXATION (ORIF) SHOULDER FRACTURE;  Surgeon:  Nita Sells, MD;  Location: [facility];  Service: Orthopedics;  Laterality: Left;  ORIF of a Left Shoulder Fracture with  Iliac Crest Bone Graft aspiration   . POSTERIOR LUMBAR FUSION  12-10   L4-5 diskectomy w/ fusion, cage placement and rods; [place]  . POSTERIOR LUMBAR FUSION 4 LEVEL N/A 03/02/2013   Procedure: Lumbar One to Sacral One Posterior lumbar interbody fusion;  Surgeon: Floyce Stakes, MD;  Location: [place] [facility]UR[facility];  Service: Neurosurgery;  Laterality: N/A;  L1 to S1 Posterior lumbar interbody fusion  . REPLACEMENT UNICONDYLAR JOINT KNEE Left ~ 2003   "~ 6 months after total knee replaced"  . SHOULDER ARTHROSCOPY Left ~ 2004 X 2   "@ [facility]; left bone splinter in & had to clean it out"  . TOTAL ANKLE REPLACEMENT Right 2008   at [facility]  . TOTAL KNEE ARTHROPLASTY Bilateral 2002  . TOTAL SHOULDER REPLACEMENT Left 2006  . TOTAL SHOULDER REPLACEMENT Right ~ 2007   Dr. Marlou Sa    Family History  Problem Relation Age of Onset  . Uterine cancer Mother        uterine vs. cervix? dx 87s; deceased 7  . Macular degeneration Mother   . Other Mother        ankle edema  . Lung cancer Mother   . Melanoma Mother        multiple spots on legs  . Cancer - Lung Mother        smoker  . Coronary artery disease Father   . Hypertension Father   . Prostate cancer Father   . Colon polyps Father   . Cancer - Prostate Father 67       deceased 88  . Heart attack Brother   . Hyperlipidemia Brother   . Other Brother        Schizophrenic  . Thyroid disease Sister   . Hemochromatosis Sister   . Rheum arthritis Sister   . Coronary artery disease Maternal Aunt   . Heart attack Maternal Aunt   . Cancer Maternal Grandmother        unk. type; deceased 42s  . Diabetes Neg Hx   . Colon cancer Neg Hx   . Esophageal cancer Neg Hx   . Rectal cancer Neg Hx   . Stomach cancer Neg Hx    Social History:  reports that  has never smoked. he has never used smokeless tobacco. He reports that he does  not drink alcohol or use drugs.  Allergies:  Allergies  Allergen Reactions  . Cefepime Hives and Shortness Of Breath  . Morphine And Related Shortness Of Breath, Nausea And Vomiting, Swelling and Other (See Comments)    Agitation, tolerates dilaudid  . Penicillins Hives, Shortness Of Breath and Rash    Has  patient had a PCN reaction causing immediate rash, facial/tongue/throat swelling, SOB or lightheadedness with hypotension: Yes Has patient had a PCN reaction causing severe rash involving mucus membranes or skin necrosis: Yes Has patient had a PCN reaction that required hospitalization No Has patient had a PCN reaction occurring within the last 10 years: No If all of the above answers are "NO", then may proceed with Cephalosporin use.     Medications Prior to Admission  Medication Sig Dispense Refill  . alfuzosin (UROXATRAL) 10 MG 24 hr tablet Take 10 mg by mouth at bedtime.   0  . ALPRAZolam (XANAX) 0.5 MG tablet Take 1 tablet (0.5 mg total) by mouth 2 (two) times daily as needed. for anxiety 60 tablet 2  . Cholecalciferol (VITAMIN D3) 2000 UNITS capsule Take 1 capsule (2,000 Units total) by mouth daily. 100 capsule 3  . clopidogrel (PLAVIX) 75 MG tablet TAKE 1 TABLET BY MOUTH EVERY DAY WITH BREAKFAST 90 tablet 3  . COLCRYS 0.6 MG tablet Take 1 tablet by mouth every evening.   2  . FLUoxetine (PROZAC) 40 MG capsule Take 1 capsule (40 mg total) by mouth 2 (two) times daily. 180 capsule 2  . fluticasone (FLONASE) 50 MCG/ACT nasal spray INSTILL 2 SPRAYS IN EACH NOSTRIL EVERY DAY 16 g 11  . gabapentin (NEURONTIN) 300 MG capsule Take 300 mg by mouth 2 (two) times daily.    [name] HYDROmorphone (DILAUDID) 2 MG tablet Take 1 tablet (2 mg total) by mouth every 8 (eight) hours as needed for severe pain. 90 tablet 0  . losartan-hydrochlorothiazide (HYZAAR) 50-12.5 MG tablet TAKE 1 TABLET BY MOUTH DAILY 90 tablet 0  . Melatonin 5 MG TABS Take 5 mg by mouth at bedtime.    . metoprolol tartrate  (LOPRESSOR) 25 MG tablet Take 1 tablet (25 mg total) by mouth 2 (two) times daily. 180 tablet 1  . Omega-3 Fatty Acids (FISH OIL OMEGA-3) 1000 MG CAPS Take 1,000 mg by mouth daily.     . pantoprazole (PROTONIX) 40 MG tablet Take 1 tablet (40 mg total) by mouth every evening. 90 tablet 1  . pravastatin (PRAVACHOL) 20 MG tablet Take 1 tablet (20 mg total) by mouth daily. (Patient taking differently: Take 20 mg by mouth every evening. ) 90 tablet 1  . predniSONE (DELTASONE) 5 MG tablet Take 5 mg by mouth daily with breakfast.    . vitamin B-12 (CYANOCOBALAMIN) 1000 MCG tablet Take 1,000 mcg by mouth daily.     [name] albuterol (PROVENTIL HFA;VENTOLIN HFA) 108 (90 Base) MCG/ACT inhaler Inhale 1-2 puffs into the lungs every 4 (four) hours as needed for wheezing or shortness of breath. 1 Inhaler 1  . tadalafil (CIALIS) 5 MG tablet Take 5 mg by mouth as needed.      No results found for this or any previous visit (from the past 48 hour(s)). No results found.  Review of Systems  All other systems reviewed and are negative.   Height 5' 5.5" (1.664 m), weight 68.5 kg (151 lb). Physical Exam  Constitutional: He is oriented to person, place, and time. He appears well-developed and well-nourished. No distress.  HENT:  Head: Normocephalic and atraumatic.  Respiratory: Effort normal. No respiratory distress.  GI: Soft.  Genitourinary:  Genitourinary Comments: Left groin swelling at incision.    Musculoskeletal:  Swollen hands/fingers from RA  Neurological: He is alert and oriented to person, place, and time.  Skin: Skin is warm and dry. He is not diaphoretic.  Psychiatric: He  has a normal mood and affect. His behavior is normal. Judgment and thought content normal.     Assessment/Plan Left medial knee melanoma, LN positive Port placement for therapy.   Reviewed risks and benefits.   May need aspiration of seroma as well from left groin.  Stark Klein, MD 03/27/2017, 9:42 AM

## 2017-03-27 NOTE — Transfer of Care (Signed)
Immediate Anesthesia Transfer of Care Note  Patient: [name]  Procedure(s) Performed: INSERTION PORT-A-CATH (Left Chest) ASPIRATION OF SEROMA (Left Groin)  Patient Location: PACU  Anesthesia Type:General  Level of Consciousness: sedated  Airway & Oxygen Therapy: Patient Spontanous Breathing and Patient connected to face mask oxygen  Post-op Assessment: Report given to RN and Post -op Vital signs reviewed and stable  Post vital signs: Reviewed and stable  Last Vitals:  Vitals:   03/27/17 0944 03/27/17 1120  BP: (!) 140/59 (!) 139/95  Pulse: (!) 53 (!) 130  Resp: 15   Temp: 36.6 C   SpO2: 100% (!) 87%    Last Pain:  Vitals:   03/27/17 0944  PainSc: 3       Patients Stated Pain Goal: 3 (67/34/19 3790)  Complications: No apparent anesthesia complications

## 2017-03-27 NOTE — Op Note (Signed)
PREOPERATIVE DIAGNOSIS:  Melanoma left medial knee     POSTOPERATIVE DIAGNOSIS:  Same     PROCEDURE: Left subclavian port placement, Bard ClearVue  Power Port, MRI safe, 8-French.      SURGEON:  Stark Klein, MD      ANESTHESIA:  General   FINDINGS:  Good venous return, easy flush, and tip of the catheter and   SVC 21 cm (catheter length actually 19 cm, but catheter cut at 21 cm and 2 cm cut off proximally).     SPECIMEN:  None.      ESTIMATED BLOOD LOSS:  Minimal.      COMPLICATIONS:  None known.      PROCEDURE:  Pt was identified in the holding area and taken to   the operating room, where patient was placed supine on the operating room   table.  General anesthesia was induced.  Patient's arms were tucked and the upper   chest and neck were prepped and draped in sterile fashion.  Time-out was   performed according to the surgical safety check list.  When all was   correct, we continued.   Local anesthetic was administered over this   area at the angle of the clavicle.  The vein was accessed with 1 pass(es) of the needle. There was good venous return and the wire passed easily with no ectopy.   Fluoroscopy was used to confirm that the wire was in the vena cava.      The patient was placed back level and the area for the pocket was anethetized   with local anesthetic.  A 3-cm transverse incision was made with a #15   blade.  Cautery was used to divide the subcutaneous tissues down to the   pectoralis muscle.  An [employer] retractor was used to elevate the skin   while a pocket was created on top of the pectoralis fascia.  The port   was placed into the pocket to confirm that it was of adequate size.  The   catheter was preattached to the port.  The port was then secured to the   pectoralis fascia with four 2-0 Prolene sutures.  These were clamped and   not tied down yet.    The catheter was tunneled through to the wire exit   site.  The catheter was placed along the wire to  determine what length it should be to be in the SVC.  The catheter was cut at 21 (again 19 cm catheter length) cm.  The tunneler sheath and dilator were passed over the wire and the dilator and wire were removed.  The catheter was advanced through the tunneler sheath and the tunneler sheath was pulled away.  Care was taken to keep the catheter in the tunneler sheath as this occurred. This was advanced and the tunneler sheath was removed.  There was good venous   return and easy flush of the catheter.  The Prolene sutures were tied   down to the pectoral fascia.  The skin was reapproximated using 3-0   Vicryl interrupted deep dermal sutures.    Fluoroscopy was used to re-confirm good position of the catheter.  The skin   was then closed using 4-0 Monocryl in a subcuticular fashion.  The port was flushed with concentrated heparin flush as well.  The wounds were then cleaned, dried, and dressed with Dermabond.  The patient was awakened from anesthesia and taken to the PACU in stable condition.  Needle, sponge, and instrument  counts were correct.               Stark Klein, MD

## 2017-03-27 NOTE — Anesthesia Postprocedure Evaluation (Signed)
Anesthesia Post Note  Patient: [name]  Procedure(s) Performed: INSERTION PORT-A-CATH (Left Chest) ASPIRATION OF SEROMA (Left Groin)     Patient location during evaluation: PACU Anesthesia Type: General Level of consciousness: awake and alert Pain management: pain level controlled Vital Signs Assessment: post-procedure vital signs reviewed and stable Respiratory status: spontaneous breathing, nonlabored ventilation, respiratory function stable and patient connected to nasal cannula oxygen Cardiovascular status: blood pressure returned to baseline and stable Postop Assessment: no apparent nausea or vomiting Anesthetic complications: no    Last Vitals:  Vitals:   03/27/17 1145 03/27/17 1200  BP: (!) 163/85 (!) 165/90  Pulse: (!) 57 (!) 58  Resp: 16 11  Temp:    SpO2: 99% 98%    Last Pain:  Vitals:   03/27/17 1200  PainSc: 0-No pain                 Rosenda Geffrard

## 2017-03-27 NOTE — Discharge Instructions (Addendum)
[place] Office Phone Number [phone]   POST OP INSTRUCTIONS  Always review your discharge instruction sheet given to you by the facility where your surgery was performed.  IF YOU HAVE DISABILITY OR FAMILY LEAVE FORMS, YOU MUST BRING THEM TO THE OFFICE FOR PROCESSING.  DO NOT GIVE THEM TO YOUR DOCTOR.  1. A prescription for pain medication may be given to you upon discharge.  Take your pain medication as prescribed, if needed.  If narcotic pain medicine is not needed, then you may take acetaminophen (Tylenol) or ibuprofen (Advil) as needed. 2. Take your usually prescribed medications unless otherwise directed 3. If you need a refill on your pain medication, please contact your pharmacy.  They will contact our office to request authorization.  Prescriptions will not be filled after 5pm or on week-ends. 4. You should eat very light the first 24 hours after surgery, such as soup, crackers, pudding, etc.  Resume your normal diet the day after surgery 5. It is common to experience some constipation if taking pain medication after surgery.  Increasing fluid intake and taking a stool softener will usually help or prevent this problem from occurring.  A mild laxative (Milk of Magnesia or Miralax) should be taken according to package directions if there are no bowel movements after 48 hours. 6. You may shower in 48 hours.  The surgical glue will flake off in 2-3 weeks.   7. ACTIVITIES:  No strenuous activity or heavy lifting for 1 week.   a. You may drive when you no longer are taking prescription pain medication, you can comfortably wear a seatbelt, and you can safely maneuver your car and apply brakes. b. RETURN TO WORK:  __________n/a or 1 week_______________ Dennis Bast should see your doctor in the office for a follow-up appointment approximately three-four weeks after your surgery.    WHEN TO CALL YOUR DOCTOR: 1. Fever over 101.0 2. Nausea and/or vomiting. 3. Extreme swelling or  bruising. 4. Continued bleeding from incision. 5. Increased pain, redness, or drainage from the incision.  The clinic staff is available to answer your questions during regular business hours.  Please dont hesitate to call and ask to speak to one of the nurses for clinical concerns.  If you have a medical emergency, go to the nearest emergency room or call 911.  A surgeon from [facility] is always on call at the hospital.  For further questions, please visit [email or web address]     Post Anesthesia Home Care Instructions  Activity: Get plenty of rest for the remainder of the day. A responsible individual must stay with you for 24 hours following the procedure.  For the next 24 hours, DO NOT: -Drive a car -Paediatric nurse -Drink alcoholic beverages -Take any medication unless instructed by your physician -Make any legal decisions or sign important papers.  Meals: Start with liquid foods such as gelatin or soup. Progress to regular foods as tolerated. Avoid greasy, spicy, heavy foods. If nausea and/or vomiting occur, drink only clear liquids until the nausea and/or vomiting subsides. Call your physician if vomiting continues.  Special Instructions/Symptoms: Your throat may feel dry or sore from the anesthesia or the breathing tube placed in your throat during surgery. If this causes discomfort, gargle with warm salt water. The discomfort should disappear within 24 hours.  If you had a scopolamine patch placed behind your ear for the management of post- operative nausea and/or vomiting:  1. The medication in the patch is effective for 72 hours, after  which it should be removed.  Wrap patch in a tissue and discard in the trash. Wash hands thoroughly with soap and water. 2. You may remove the patch earlier than 72 hours if you experience unpleasant side effects which may include dry mouth, dizziness or visual disturbances. 3. Avoid touching the patch. Wash your hands  with soap and water after contact with the patch.

## 2017-03-27 NOTE — Anesthesia Procedure Notes (Addendum)
Procedure Name: LMA Insertion Date/Time: 03/27/2017 10:34 AM Performed by: Janeece Riggers, MD Pre-anesthesia Checklist: Patient identified, Emergency Drugs available, Suction available and Patient being monitored Patient Re-evaluated:Patient Re-evaluated prior to induction Oxygen Delivery Method: Circle system utilized Preoxygenation: Pre-oxygenation with 100% oxygen Induction Type: IV induction Ventilation: Mask ventilation without difficulty LMA: LMA inserted LMA Size: 5.0 Number of attempts: 1 Airway Equipment and Method: Bite block Placement Confirmation: positive ETCO2 Tube secured with: Tape Dental Injury: Teeth and Oropharynx as per pre-operative assessment

## 2017-03-28 ENCOUNTER — Encounter (HOSPITAL_BASED_OUTPATIENT_CLINIC_OR_DEPARTMENT_OTHER): Payer: Self-pay | Admitting: General Surgery

## 2017-03-31 ENCOUNTER — Encounter: Payer: Self-pay | Admitting: Hematology & Oncology

## 2017-04-03 ENCOUNTER — Telehealth: Payer: Self-pay | Admitting: Hematology & Oncology

## 2017-04-03 NOTE — Telephone Encounter (Signed)
Called patient re genetics referral - duplicate - patient already on schedule. Per patient moved genetics appointment from 1/29 to 1/24. Patient has new date/time.

## 2017-04-04 ENCOUNTER — Ambulatory Visit: Payer: Self-pay | Admitting: Genetic Counselor

## 2017-04-04 ENCOUNTER — Encounter: Payer: Self-pay | Admitting: Genetic Counselor

## 2017-04-04 DIAGNOSIS — Z1379 Encounter for other screening for genetic and chromosomal anomalies: Secondary | ICD-10-CM

## 2017-04-04 NOTE — Progress Notes (Signed)
[facility]       Genetic Test Results    Patient Name: [name] Patient DOB: [birth year] Patient Age: 71 y.o. Encounter Date: 04/04/2017  Referring Provider: Stark Klein, MD  Primary Care Provider: Plotnikov, Evie Lacks, MD   [name] was called today to discuss genetic test results. Please see the Genetics note from his visit on 03/06/17 for a detailed discussion of his personal and family history.  Genetic Testing: At the time of [name] visit, he decided to pursue genetic testing of multiple genes associated with hereditary susceptibility to cancer. Testing included sequencing and deletion/duplication analysis. Testing did not reveal any pathogenic mutation in any of these genes.  A copy of the genetic test report will be scanned into Epic under the media tab.  The genes analyzed were the 83 genes on [facility]'s Multi-Cancer panel (ALK, APC, ATM, AXIN2, BAP1, BARD1, BLM, BMPR1A, BRCA1, BRCA2, BRIP1, CASR, CDC73, CDH1, CDK4, CDKN1B, CDKN1C, CDKN2A, CEBPA, CHEK2, CTNNA1, DICER1, DIS3L2, EGFR, EPCAM, FH, FLCN, GATA2, GPC3, GREM1, HOXB13, HRAS, KIT, MAX, MEN1, MET, MITF, MLH1, MSH2, MSH3, MSH6, MUTYH, NBN, NF1, NF2, NTHL1, PALB2, PDGFRA, PHOX2B, PMS2, POLD1, POLE, POT1, PRKAR1A, PTCH1, PTEN, RAD50, RAD51C, RAD51D, RB1, RECQL4, RET, RUNX1, SDHA, SDHAF2, SDHB, SDHC, SDHD, SMAD4, SMARCA4, SMARCB1, SMARCE1, STK11, SUFU, TERC, TERT, TMEM127, TP53, TSC1, TSC2, VHL, WRN, WT1).  Since the current test is not perfect, it is possible that there may be a gene mutation that current testing cannot detect, but that chance is small. It is possible that a different genetic factor, which has not yet been discovered or is not on this panel, is responsible for the cancer diagnoses in the family. Again, the likelihood of this is low. No additional testing is recommended at this time for [name].  Cancer Screening: [name] is recommended to follow the  cancer screening guidelines provided by his physician.   Family Members: Family members are recommended to speak with their own providers about appropriate cancer screenings. Any relative who had cancer at a young age or had a particularly rare cancer may also wish to pursue genetic testing. Genetic counselors can be located in other cities, by visiting the website of the [employer] ([email or web address]) and Field seismologist for a Dietitian by zip code.    Lastly, cancer genetics is a rapidly advancing field and it is possible that new genetic tests will be appropriate for [name] in the future. We encourage him to remain in contact with [place] on an annual basis so we can update his personal and family histories, and let him know of advances in cancer genetics that may benefit the family. Our contact number was provided. [name] is welcome to call anytime with additional questions.     Steele Berg, MS, [place] Certified Genetic Counselor phone: [phone]

## 2017-04-10 ENCOUNTER — Other Ambulatory Visit (HOSPITAL_BASED_OUTPATIENT_CLINIC_OR_DEPARTMENT_OTHER): Payer: Medicare Other

## 2017-04-10 ENCOUNTER — Other Ambulatory Visit: Payer: Self-pay

## 2017-04-10 ENCOUNTER — Ambulatory Visit (HOSPITAL_BASED_OUTPATIENT_CLINIC_OR_DEPARTMENT_OTHER): Payer: Medicare Other | Admitting: Hematology & Oncology

## 2017-04-10 ENCOUNTER — Ambulatory Visit: Payer: Medicare Other

## 2017-04-10 ENCOUNTER — Ambulatory Visit (HOSPITAL_BASED_OUTPATIENT_CLINIC_OR_DEPARTMENT_OTHER): Payer: Medicare Other

## 2017-04-10 VITALS — BP 134/61 | HR 58 | Temp 97.9°F | Resp 18 | Wt 150.0 lb

## 2017-04-10 DIAGNOSIS — R531 Weakness: Secondary | ICD-10-CM

## 2017-04-10 DIAGNOSIS — C4372 Malignant melanoma of left lower limb, including hip: Secondary | ICD-10-CM

## 2017-04-10 DIAGNOSIS — D500 Iron deficiency anemia secondary to blood loss (chronic): Secondary | ICD-10-CM

## 2017-04-10 DIAGNOSIS — Z5112 Encounter for antineoplastic immunotherapy: Secondary | ICD-10-CM

## 2017-04-10 LAB — CMP (CANCER CENTER ONLY)
ALT(SGPT): 21 U/L (ref 10–47)
AST: 20 U/L (ref 11–38)
Albumin: 3.2 g/dL — ABNORMAL LOW (ref 3.3–5.5)
Alkaline Phosphatase: 65 U/L (ref 26–84)
BUN, Bld: 21 mg/dL (ref 7–22)
CO2: 31 mEq/L (ref 18–33)
Calcium: 8.9 mg/dL (ref 8.0–10.3)
Chloride: 98 mEq/L (ref 98–108)
Creat: 0.9 mg/dl (ref 0.6–1.2)
Glucose, Bld: 102 mg/dL (ref 73–118)
Potassium: 3.7 mEq/L (ref 3.3–4.7)
Sodium: 140 mEq/L (ref 128–145)
Total Bilirubin: 0.7 mg/dl (ref 0.20–1.60)
Total Protein: 5.8 g/dL — ABNORMAL LOW (ref 6.4–8.1)

## 2017-04-10 LAB — IRON AND TIBC
%SAT: 16 % — ABNORMAL LOW (ref 20–55)
Iron: 33 ug/dL — ABNORMAL LOW (ref 42–163)
TIBC: 204 ug/dL (ref 202–409)
UIBC: 170 ug/dL (ref 117–376)

## 2017-04-10 LAB — CBC WITH DIFFERENTIAL (CANCER CENTER ONLY)
BASO#: 0 10*3/uL (ref 0.0–0.2)
BASO%: 0.4 % (ref 0.0–2.0)
EOS%: 0.7 % (ref 0.0–7.0)
Eosinophils Absolute: 0.1 10*3/uL (ref 0.0–0.5)
HCT: 32.7 % — ABNORMAL LOW (ref 38.7–49.9)
HGB: 10.5 g/dL — ABNORMAL LOW (ref 13.0–17.1)
LYMPH#: 1.1 10*3/uL (ref 0.9–3.3)
LYMPH%: 15.4 % (ref 14.0–48.0)
MCH: 29.6 pg (ref 28.0–33.4)
MCHC: 32.1 g/dL (ref 32.0–35.9)
MCV: 92 fL (ref 82–98)
MONO#: 0.6 10*3/uL (ref 0.1–0.9)
MONO%: 8.8 % (ref 0.0–13.0)
NEUT#: 5.4 10*3/uL (ref 1.5–6.5)
NEUT%: 74.7 % (ref 40.0–80.0)
Platelets: 189 10*3/uL (ref 145–400)
RBC: 3.55 10*6/uL — ABNORMAL LOW (ref 4.20–5.70)
RDW: 15.3 % (ref 11.1–15.7)
WBC: 7.3 10*3/uL (ref 4.0–10.0)

## 2017-04-10 LAB — LACTATE DEHYDROGENASE: LDH: 165 U/L (ref 125–245)

## 2017-04-10 LAB — FERRITIN: Ferritin: 29 ng/ml (ref 22–316)

## 2017-04-10 LAB — TSH: TSH: 0.778 m(IU)/L (ref 0.320–4.118)

## 2017-04-10 MED ORDER — SODIUM CHLORIDE 0.9% FLUSH
10.0000 mL | INTRAVENOUS | Status: DC | PRN
  Administered 2017-04-10: 10 mL
  Filled 2017-04-10: qty 10

## 2017-04-10 MED ORDER — HEPARIN SOD (PORK) LOCK FLUSH 100 UNIT/ML IV SOLN
500.0000 [IU] | Freq: Once | INTRAVENOUS | Status: AC | PRN
  Administered 2017-04-10: 500 [IU]
  Filled 2017-04-10: qty 5

## 2017-04-10 MED ORDER — SODIUM CHLORIDE 0.9 % IV SOLN
240.0000 mg | Freq: Once | INTRAVENOUS | Status: AC
  Administered 2017-04-10: 240 mg via INTRAVENOUS
  Filled 2017-04-10: qty 24

## 2017-04-10 MED ORDER — SODIUM CHLORIDE 0.9 % IV SOLN
Freq: Once | INTRAVENOUS | Status: AC
  Administered 2017-04-10: 13:00:00 via INTRAVENOUS

## 2017-04-10 NOTE — Patient Instructions (Signed)
Nivolumab injection What is this medicine? NIVOLUMAB (nye VOL ue mab) is a monoclonal antibody. It is used to treat melanoma, lung cancer, kidney cancer, head and neck cancer, Hodgkin lymphoma, urothelial cancer, colon cancer, and liver cancer. This medicine may be used for other purposes; ask your health care provider or pharmacist if you have questions. COMMON BRAND NAME(S): Opdivo What should I tell my health care provider before I take this medicine? They need to know if you have any of these conditions: -diabetes -immune system problems -kidney disease -liver disease -lung disease -organ transplant -stomach or intestine problems -thyroid disease -an unusual or allergic reaction to nivolumab, other medicines, foods, dyes, or preservatives -pregnant or trying to get pregnant -breast-feeding How should I use this medicine? This medicine is for infusion into a vein. It is given by a health care professional in a hospital or clinic setting. A special MedGuide will be given to you before each treatment. Be sure to read this information carefully each time. Talk to your pediatrician regarding the use of this medicine in children. While this drug may be prescribed for children as young as 12 years for selected conditions, precautions do apply. Overdosage: If you think you have taken too much of this medicine contact a poison control center or emergency room at once. NOTE: This medicine is only for you. Do not share this medicine with others. What if I miss a dose? It is important not to miss your dose. Call your doctor or health care professional if you are unable to keep an appointment. What may interact with this medicine? Interactions have not been studied. Give your health care provider a list of all the medicines, herbs, non-prescription drugs, or dietary supplements you use. Also tell them if you smoke, drink alcohol, or use illegal drugs. Some items may interact with your  medicine. This list may not describe all possible interactions. Give your health care provider a list of all the medicines, herbs, non-prescription drugs, or dietary supplements you use. Also tell them if you smoke, drink alcohol, or use illegal drugs. Some items may interact with your medicine. What should I watch for while using this medicine? This drug may make you feel generally unwell. Continue your course of treatment even though you feel ill unless your doctor tells you to stop. You may need blood work done while you are taking this medicine. Do not become pregnant while taking this medicine or for 5 months after stopping it. Women should inform their doctor if they wish to become pregnant or think they might be pregnant. There is a potential for serious side effects to an unborn child. Talk to your health care professional or pharmacist for more information. Do not breast-feed an infant while taking this medicine. What side effects may I notice from receiving this medicine? Side effects that you should report to your doctor or health care professional as soon as possible: -allergic reactions like skin rash, itching or hives, swelling of the face, lips, or tongue -black, tarry stools -blood in the urine -bloody or watery diarrhea -changes in vision -change in sex drive -changes in emotions or moods -chest pain -confusion -cough -decreased appetite -diarrhea -facial flushing -feeling faint or lightheaded -fever, chills -hair loss -hallucination, loss of contact with reality -headache -irritable -joint pain -loss of memory -muscle pain -muscle weakness -seizures -shortness of breath -signs and symptoms of high blood sugar such as dizziness; dry mouth; dry skin; fruity breath; nausea; stomach pain; increased hunger or thirst; increased  urination -signs and symptoms of kidney injury like trouble passing urine or change in the amount of urine -signs and symptoms of liver injury  like dark yellow or brown urine; general ill feeling or flu-like symptoms; light-colored stools; loss of appetite; nausea; right upper belly pain; unusually weak or tired; yellowing of the eyes or skin -stiff neck -swelling of the ankles, feet, hands -weight gain Side effects that usually do not require medical attention (report to your doctor or health care professional if they continue or are bothersome): -bone pain -constipation -tiredness -vomiting This list may not describe all possible side effects. Call your doctor for medical advice about side effects. You may report side effects to FDA at 1-800-FDA-1088. Where should I keep my medicine? This drug is given in a hospital or clinic and will not be stored at home. NOTE: This sheet is a summary. It may not cover all possible information. If you have questions about this medicine, talk to your doctor, pharmacist, or health care provider.  2018 Elsevier/Gold Standard (2016-01-01 17:49:34)

## 2017-04-11 ENCOUNTER — Encounter: Payer: Self-pay | Admitting: Hematology & Oncology

## 2017-04-11 ENCOUNTER — Telehealth: Payer: Self-pay | Admitting: *Deleted

## 2017-04-11 ENCOUNTER — Other Ambulatory Visit: Payer: Self-pay | Admitting: Hematology & Oncology

## 2017-04-11 MED ORDER — ONDANSETRON HCL 8 MG PO TABS: 8.0000 mg | ORAL_TABLET | Freq: Three times a day (TID) | ORAL | 1 refills | Status: DC | PRN

## 2017-04-11 MED ORDER — PROCHLORPERAZINE MALEATE 10 MG PO TABS: 10.0000 mg | ORAL_TABLET | Freq: Four times a day (QID) | ORAL | 1 refills | Status: DC | PRN

## 2017-04-11 NOTE — Telephone Encounter (Signed)
Patient is doing okay after his chemotherapy. He states he feels a little nauseous and tired today. Patient was not given anything for nausea for home use. Zofran and Compazine sent to patient's pharmacy. Patient is aware.  Instructed to take medications as needed. Also recommended increased fluid intake and staying well hydrated. He understands.  Patient will call back with any questions or concerns.  Also informed patient of message below from Dr Jearld Fenton, Rudell Cobb, MD  P Onc Nurse [facility]        cll - the thyroid level is ok!!! pete

## 2017-04-11 NOTE — Progress Notes (Signed)
Hematology and Oncology Follow Up Visit  [name] [id number] 1945/07/13 72 y.o. 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  We saw him about a month ago.  He now has a Port-A-Cath in.  We will start him on adjuvant Nivolumab therapy.  We tried to send off the BRAF analysis.  Somehow, there was not enough tissue.  I will see if we cannot send it off for another analysis.  He feels tired.  He does have quite a few health issues.  He does have rheumatoid arthritis.  His iron studies, today, showed a ferritin of 29 with an iron saturation of 16%.  He did have a decent Christmas and New Year's.  He has had no bleeding.  There has been no change in bowel or bladder habits.  He does have a recurrent seroma in the upper left thigh.  This is been drained several times.  He goes back to the surgeon tomorrow for another drainage.  He has had no fever.  He has had no cough or shortness of breath.  Currently, his performance status is ECOG 1.  Medications:  Current Outpatient Medications:  .  albuterol (PROVENTIL HFA;VENTOLIN HFA) 108 (90 Base) MCG/ACT inhaler, Inhale 1-2 puffs into the lungs every 4 (four) hours as needed for wheezing or shortness of breath., Disp: 1 Inhaler, Rfl: 1 .  alfuzosin (UROXATRAL) 10 MG 24 hr tablet, Take 10 mg by mouth at bedtime. , Disp: , Rfl: 0 .  ALPRAZolam (XANAX) 0.5 MG tablet, Take 1 tablet (0.5 mg total) by mouth 2 (two) times daily as needed. for anxiety, Disp: 60 tablet, Rfl: 2 .  Cholecalciferol (VITAMIN D3) 2000 UNITS capsule, Take 1 capsule (2,000 Units total) by mouth daily., Disp: 100 capsule, Rfl: 3 .  clopidogrel (PLAVIX) 75 MG  tablet, TAKE 1 TABLET BY MOUTH EVERY DAY WITH BREAKFAST, Disp: 90 tablet, Rfl: 3 .  COLCRYS 0.6 MG tablet, Take 1 tablet by mouth every evening. , Disp: , Rfl: 2 .  FLUoxetine (PROZAC) 40 MG capsule, Take 1 capsule (40 mg total) by mouth 2 (two) times daily., Disp: 180 capsule, Rfl: 2 .  fluticasone (FLONASE) 50 MCG/ACT nasal spray, INSTILL 2 SPRAYS IN EACH NOSTRIL EVERY DAY, Disp: 16 g, Rfl: 11 .  gabapentin (NEURONTIN) 300 MG capsule, Take 300 mg by mouth 2 (two) times daily., Disp: , Rfl:  .  HYDROmorphone (DILAUDID) 2 MG tablet, Take 1 tablet (2 mg total) by mouth every 8 (eight) hours as needed for severe pain., Disp: 20 tablet, Rfl: 0 .  losartan-hydrochlorothiazide (HYZAAR) 50-12.5 MG tablet, TAKE 1 TABLET BY MOUTH DAILY, Disp: 90 tablet, Rfl: 0 .  Melatonin 5 MG TABS, Take 5 mg by mouth at bedtime., Disp: , Rfl:  .  metoprolol tartrate (LOPRESSOR) 25 MG tablet, Take 1 tablet (25 mg total) by mouth 2 (two) times daily., Disp: 180 tablet, Rfl: 1 .  Omega-3 Fatty Acids (FISH OIL OMEGA-3) 1000 MG CAPS, Take 1,000 mg by mouth daily. , Disp: , Rfl:  .  pantoprazole (PROTONIX) 40 MG tablet, Take 1 tablet (40 mg total) by mouth every evening., Disp: 90 tablet, Rfl: 1 .  pravastatin (PRAVACHOL) 20 MG tablet, Take 1 tablet (20 mg total) by mouth daily. (Patient taking differently: Take 20 mg by mouth every evening. ), Disp: 90 tablet, Rfl: 1 .  predniSONE (DELTASONE) 5 MG tablet, Take 5 mg by mouth daily with breakfast., Disp: , Rfl:  .  tadalafil (CIALIS) 5 MG tablet, Take 5 mg by mouth as needed., Disp: , Rfl:  .  vitamin B-12 (CYANOCOBALAMIN) 1000 MCG tablet, Take 1,000 mcg by mouth daily. , Disp: , Rfl:   Allergies:  Allergies  Allergen Reactions  . Cefepime Hives and Shortness Of Breath  . Morphine And Related Shortness Of Breath, Nausea And Vomiting, Swelling and Other (See Comments)    Agitation, tolerates dilaudid  . Penicillins Hives, Shortness Of Breath and Rash    Has patient had a  PCN reaction causing immediate rash, facial/tongue/throat swelling, SOB or lightheadedness with hypotension: Yes Has patient had a PCN reaction causing severe rash involving mucus membranes or skin necrosis: Yes Has patient had a PCN reaction that required hospitalization No Has patient had a PCN reaction occurring within the last 10 years: No If all of the above answers are "NO", then may proceed with Cephalosporin use.     Past Medical History, Surgical history, Social history, and Family History were reviewed and updated.  Review of Systems: Review of Systems  Constitutional: Positive for appetite change and fatigue.  HENT:  Negative.   Eyes: Negative.   Respiratory: Negative.   Cardiovascular: Negative.   Gastrointestinal: Positive for nausea.  Endocrine: Negative.   Genitourinary: Positive for frequency.   Musculoskeletal: Positive for arthralgias, back pain, neck pain and neck stiffness.  Skin: Positive for itching and wound.  Neurological: Positive for dizziness.  Hematological: Negative.   Psychiatric/Behavioral: Negative.     Physical Exam:  weight is 150 lb (68 kg). His oral temperature is 97.9 F (36.6 C). His blood pressure is 134/61 and his pulse is 58 (abnormal). His respiration is 18 and oxygen saturation is 100%.   Wt Readings from Last 3 Encounters:  04/10/17 150 lb (68 kg)  03/27/17 146 lb 3.2 oz (66.3 kg)  03/11/17 151 lb 8 oz (68.7 kg)    Physical Exam  Constitutional: He is oriented to person, place, and time.  HENT:  Head: Normocephalic and atraumatic.  Mouth/Throat: Oropharynx is clear and moist.  Eyes: EOM are normal. Pupils are equal, round, and reactive to light.  Neck: Normal range of motion.  Cardiovascular: Normal rate, regular rhythm and normal heart sounds.  Pulmonary/Chest: Effort normal and breath sounds normal.  Abdominal: Soft. Bowel sounds are normal.  Musculoskeletal: Normal range of motion. He exhibits no edema, tenderness or  deformity.  Lymphadenopathy:    He has no cervical adenopathy.  Neurological: He is alert and oriented to person, place, and time.  Skin: Skin is warm and dry. No rash noted. No erythema.  Psychiatric: He has a normal mood and affect. His behavior is normal. Judgment and thought content normal.  Vitals reviewed.    Lab Results  Component Value Date   WBC 7.3 04/10/2017   HGB 10.5 (L) 04/10/2017   HCT 32.7 (L) 04/10/2017   MCV 92 04/10/2017   PLT 189 04/10/2017     Chemistry      Component Value Date/Time   NA 140 04/10/2017 1046   NA 135 (L) 03/06/2017 1104   K 3.7 04/10/2017 1046   K 4.2 03/06/2017 1104   CL 98 04/10/2017 1046   CO2 31 04/10/2017  1046   CO2 30 (H) 03/06/2017 1104   BUN 21 04/10/2017 1046   BUN 20.0 03/06/2017 1104   CREATININE 0.9 04/10/2017 1046   CREATININE 1.0 03/06/2017 1104      Component Value Date/Time   CALCIUM 8.9 04/10/2017 1046   CALCIUM 9.4 03/06/2017 1104   ALKPHOS 65 04/10/2017 1046   ALKPHOS 78 03/06/2017 1104   AST 20 04/10/2017 1046   AST 20 03/06/2017 1104   ALT 21 04/10/2017 1046   ALT 20 03/06/2017 1104   BILITOT 0.70 04/10/2017 1046   BILITOT 0.47 03/06/2017 1104         Impression and Plan: Mr. Luecke is a 72 year old white male.  We had seen him before with hemochromatosis.  This really is not an issue right now.  His melanoma is definitely a problem.  He will start his adjuvant therapy today.  I am not sure that the Nivolumab will adversely affect his rheumatoid arthritis.  He is being seen by rheumatology.  Hopefully, this seroma in the left thigh will improve.  We will continue to watch him every couple weeks.  We spent about 30 minutes with him and his wife today.  He has a lot of health issues.  He likes talking about these with [place] and likes our opinions and recommendations.   [name], MD 1/4/20197:11 AM

## 2017-04-21 ENCOUNTER — Encounter: Payer: Self-pay | Admitting: Internal Medicine

## 2017-04-21 ENCOUNTER — Ambulatory Visit (INDEPENDENT_AMBULATORY_CARE_PROVIDER_SITE_OTHER): Payer: Medicare Other | Admitting: Internal Medicine

## 2017-04-21 DIAGNOSIS — M05711 Rheumatoid arthritis with rheumatoid factor of right shoulder without organ or systems involvement: Secondary | ICD-10-CM | POA: Diagnosis not present

## 2017-04-21 DIAGNOSIS — K5901 Slow transit constipation: Secondary | ICD-10-CM

## 2017-04-21 DIAGNOSIS — C4372 Malignant melanoma of left lower limb, including hip: Secondary | ICD-10-CM | POA: Diagnosis not present

## 2017-04-21 DIAGNOSIS — I10 Essential (primary) hypertension: Secondary | ICD-10-CM | POA: Diagnosis not present

## 2017-04-21 DIAGNOSIS — K5900 Constipation, unspecified: Secondary | ICD-10-CM | POA: Insufficient documentation

## 2017-04-21 MED ORDER — HYDROMORPHONE HCL 2 MG PO TABS: 2.0000 mg | ORAL_TABLET | Freq: Three times a day (TID) | ORAL | 0 refills | Status: DC | PRN

## 2017-04-21 MED ORDER — LINACLOTIDE 290 MCG PO CAPS: 290.0000 ug | ORAL_CAPSULE | Freq: Every day | ORAL | 3 refills | Status: DC

## 2017-04-21 MED ORDER — ALPRAZOLAM 0.5 MG PO TABS: 0.5000 mg | ORAL_TABLET | Freq: Two times a day (BID) | ORAL | 3 refills | Status: DC | PRN

## 2017-04-21 NOTE — Assessment & Plan Note (Signed)
On Rx 

## 2017-04-21 NOTE — Assessment & Plan Note (Signed)
Try Linzess

## 2017-04-21 NOTE — Progress Notes (Signed)
Subjective:  Patient ID: [name], male    DOB: 1945/05/17  Age: 72 y.o. MRN: [id number]  CC: No chief complaint on file.   HPI [name] presents for chronic pain - on Rx F/u melanoma - in chemo treatment now F/u depression, RA f/u  Outpatient Medications Prior to Visit  Medication Sig Dispense Refill  . albuterol (PROVENTIL HFA;VENTOLIN HFA) 108 (90 Base) MCG/ACT inhaler Inhale 1-2 puffs into the lungs every 4 (four) hours as needed for wheezing or shortness of breath. 1 Inhaler 1  . alfuzosin (UROXATRAL) 10 MG 24 hr tablet Take 10 mg by mouth at bedtime.   0  . ALPRAZolam (XANAX) 0.5 MG tablet Take 1 tablet (0.5 mg total) by mouth 2 (two) times daily as needed. for anxiety 60 tablet 2  . Cholecalciferol (VITAMIN D3) 2000 UNITS capsule Take 1 capsule (2,000 Units total) by mouth daily. 100 capsule 3  . clopidogrel (PLAVIX) 75 MG tablet TAKE 1 TABLET BY MOUTH EVERY DAY WITH BREAKFAST 90 tablet 3  . COLCRYS 0.6 MG tablet Take 1 tablet by mouth every evening.   2  . FLUoxetine (PROZAC) 40 MG capsule Take 1 capsule (40 mg total) by mouth 2 (two) times daily. 180 capsule 2  . fluticasone (FLONASE) 50 MCG/ACT nasal spray INSTILL 2 SPRAYS IN EACH NOSTRIL EVERY DAY 16 g 11  . gabapentin (NEURONTIN) 300 MG capsule Take 300 mg by mouth 2 (two) times daily.    [name] HYDROmorphone (DILAUDID) 2 MG tablet Take 1 tablet (2 mg total) by mouth every 8 (eight) hours as needed for severe pain. 20 tablet 0  . losartan-hydrochlorothiazide (HYZAAR) 50-12.5 MG tablet TAKE 1 TABLET BY MOUTH DAILY 90 tablet 0  . Melatonin 5 MG TABS Take 5 mg by mouth at bedtime.    . metoprolol tartrate (LOPRESSOR) 25 MG tablet Take 1 tablet (25 mg total) by mouth 2 (two) times daily. 180 tablet 1  . Omega-3 Fatty Acids (FISH OIL OMEGA-3) 1000 MG CAPS Take 1,000 mg by mouth daily.     . ondansetron (ZOFRAN) 8 MG tablet Take 1 tablet (8 mg total) by mouth every 8 (eight) hours as needed for nausea or vomiting. 30  tablet 1  . pantoprazole (PROTONIX) 40 MG tablet Take 1 tablet (40 mg total) by mouth every evening. 90 tablet 1  . pravastatin (PRAVACHOL) 20 MG tablet Take 1 tablet (20 mg total) by mouth daily. (Patient taking differently: Take 20 mg by mouth every evening. ) 90 tablet 1  . predniSONE (DELTASONE) 5 MG tablet Take 5 mg by mouth daily with breakfast.    . prochlorperazine (COMPAZINE) 10 MG tablet TAKE 1 TABLET(10 MG) BY MOUTH EVERY 6 HOURS AS NEEDED FOR NAUSEA OR VOMITING 385 tablet 1  . tadalafil (CIALIS) 5 MG tablet Take 5 mg by mouth as needed.    . vitamin B-12 (CYANOCOBALAMIN) 1000 MCG tablet Take 1,000 mcg by mouth daily.      No facility-administered medications prior to visit.     ROS Review of Systems  Constitutional: Positive for fatigue. Negative for appetite change and unexpected weight change.  HENT: Negative for congestion, nosebleeds, sneezing, sore throat and trouble swallowing.   Eyes: Negative for itching and visual disturbance.  Respiratory: Negative for cough.   Cardiovascular: Negative for chest pain, palpitations and leg swelling.  Gastrointestinal: Positive for constipation and nausea. Negative for abdominal distention, blood in stool and diarrhea.  Genitourinary: Negative for frequency and hematuria.  Musculoskeletal: Positive  for arthralgias, back pain and neck pain. Negative for gait problem and joint swelling.  Skin: Positive for wound. Negative for rash.  Neurological: Negative for dizziness, tremors, speech difficulty and weakness.  Psychiatric/Behavioral: Negative for agitation, dysphoric mood and sleep disturbance. The patient is not nervous/anxious.     Objective:  BP 128/76 (BP Location: Left Arm, Patient Position: Sitting, Cuff Size: Normal)   Pulse 61   Temp 98.4 F (36.9 C) (Oral)   Ht 5' 5.5" (1.664 m)   Wt 149 lb (67.6 kg)   SpO2 98%   BMI 24.42 kg/m   BP Readings from Last 3 Encounters:  04/21/17 128/76  04/10/17 134/61  03/27/17 128/70      Wt Readings from Last 3 Encounters:  04/21/17 149 lb (67.6 kg)  04/10/17 150 lb (68 kg)  03/27/17 146 lb 3.2 oz (66.3 kg)    Physical Exam  Constitutional: He is oriented to person, place, and time. He appears well-developed. No distress.  NAD  HENT:  Mouth/Throat: Oropharynx is clear and moist.  Eyes: Conjunctivae are normal. Pupils are equal, round, and reactive to light.  Neck: Normal range of motion. No JVD present. No thyromegaly present.  Cardiovascular: Normal rate, regular rhythm, normal heart sounds and intact distal pulses. Exam reveals no gallop and no friction rub.  No murmur heard. Pulmonary/Chest: Effort normal and breath sounds normal. No respiratory distress. He has no wheezes. He has no rales. He exhibits no tenderness.  Abdominal: Soft. Bowel sounds are normal. He exhibits no distension and no mass. There is no tenderness. There is no rebound and no guarding.  Musculoskeletal: Normal range of motion. He exhibits tenderness. He exhibits no edema.  Lymphadenopathy:    He has no cervical adenopathy.  Neurological: He is alert and oriented to person, place, and time. He has normal reflexes. No cranial nerve deficit. He exhibits normal muscle tone. He displays a negative Romberg sign. Coordination and gait normal.  Skin: Skin is warm and dry. No rash noted.  Psychiatric: He has a normal mood and affect. His behavior is normal. Judgment and thought content normal.  L prox thigh mass, sensitive to palpation Deformed joints Portacath  Lab Results  Component Value Date   WBC 7.3 04/10/2017   HGB 10.5 (L) 04/10/2017   HCT 32.7 (L) 04/10/2017   PLT 189 04/10/2017   GLUCOSE 102 04/10/2017   CHOL 128 03/10/2015   TRIG 95.0 03/10/2015   HDL 39.30 03/10/2015   LDLCALC 70 03/10/2015   ALT 21 04/10/2017   AST 20 04/10/2017   NA 140 04/10/2017   K 3.7 04/10/2017   CL 98 04/10/2017   CREATININE 0.9 04/10/2017   BUN 21 04/10/2017   CO2 31 04/10/2017   TSH 0.778  04/10/2017   PSA 1.95 10/15/2010   INR 1.1 (H) 06/14/2014   HGBA1C 6.0 09/28/2015    Dg Chest Port 1 View  Result Date: 03/27/2017 CLINICAL DATA:  Status post chest port EXAM: PORTABLE CHEST 1 VIEW COMPARISON:  PET-CT dated 03/07/2017 FINDINGS: Low lung volumes with vascular crowding. Mild interstitial edema is not excluded. Small left pleural effusion.  No pneumothorax. The heart is normal in size. Left chest port terminates in the lower SVC. Bilateral shoulder arthroplasties. IMPRESSION: Left chest port terminates in the lower SVC.  No pneumothorax. Possible mild interstitial edema.  Small left pleural effusion. Electronically Signed   By: Julian Hy M.D.   On: 03/27/2017 12:05   Dg Fluoro Guide Cv Line-no Report  Result  Date: 03/27/2017 Fluoroscopy was utilized by the requesting physician.  No radiographic interpretation.    Assessment & Plan:   There are no diagnoses linked to this encounter. I am having John Shannahan. [name] maintain his Vitamin D3, FISH OIL OMEGA-3, alfuzosin, Melatonin, COLCRYS, FLUoxetine, pravastatin, albuterol, losartan-hydrochlorothiazide, fluticasone, tadalafil, gabapentin, predniSONE, vitamin B-12, ALPRAZolam, pantoprazole, clopidogrel, metoprolol tartrate, HYDROmorphone, ondansetron, and prochlorperazine.  No orders of the defined types were placed in this encounter.    Follow-up: No Follow-up on file.  [name], MD

## 2017-04-21 NOTE — Assessment & Plan Note (Signed)
F/u w/Dr Marin Olp, Dr Barry Dienes   on chemo

## 2017-04-21 NOTE — Assessment & Plan Note (Signed)
Dilaudid  Potential benefits of a long term opioids use as well as potential risks (i.e. addiction risk, apnea etc) and complications (i.e. Somnolence, constipation and others) were explained to the patient and were aknowledged.

## 2017-04-23 ENCOUNTER — Other Ambulatory Visit: Payer: Self-pay | Admitting: *Deleted

## 2017-04-23 DIAGNOSIS — C4372 Malignant melanoma of left lower limb, including hip: Secondary | ICD-10-CM

## 2017-04-24 ENCOUNTER — Other Ambulatory Visit: Payer: Self-pay

## 2017-04-24 ENCOUNTER — Inpatient Hospital Stay: Payer: Medicare Other

## 2017-04-24 ENCOUNTER — Inpatient Hospital Stay: Payer: Medicare Other | Attending: Hematology & Oncology

## 2017-04-24 ENCOUNTER — Other Ambulatory Visit: Payer: Medicare Other

## 2017-04-24 VITALS — BP 110/56 | HR 62 | Temp 98.3°F | Resp 18

## 2017-04-24 DIAGNOSIS — C4372 Malignant melanoma of left lower limb, including hip: Secondary | ICD-10-CM

## 2017-04-24 DIAGNOSIS — R0602 Shortness of breath: Secondary | ICD-10-CM | POA: Diagnosis not present

## 2017-04-24 DIAGNOSIS — D509 Iron deficiency anemia, unspecified: Secondary | ICD-10-CM | POA: Insufficient documentation

## 2017-04-24 DIAGNOSIS — J449 Chronic obstructive pulmonary disease, unspecified: Secondary | ICD-10-CM | POA: Insufficient documentation

## 2017-04-24 DIAGNOSIS — Z79899 Other long term (current) drug therapy: Secondary | ICD-10-CM | POA: Insufficient documentation

## 2017-04-24 DIAGNOSIS — Z5112 Encounter for antineoplastic immunotherapy: Secondary | ICD-10-CM | POA: Diagnosis not present

## 2017-04-24 DIAGNOSIS — R110 Nausea: Secondary | ICD-10-CM

## 2017-04-24 LAB — CMP (CANCER CENTER ONLY)
ALT: 19 U/L (ref 0–55)
AST: 19 U/L (ref 5–34)
Albumin: 3.2 g/dL — ABNORMAL LOW (ref 3.5–5.0)
Alkaline Phosphatase: 81 U/L (ref 26–84)
Anion gap: 10 (ref 5–15)
BUN: 21 mg/dL (ref 7–22)
CO2: 32 mmol/L (ref 18–33)
Calcium: 8.7 mg/dL (ref 8.0–10.3)
Chloride: 93 mmol/L — ABNORMAL LOW (ref 98–108)
Creatinine: 1 mg/dL (ref 0.70–1.30)
Glucose, Bld: 108 mg/dL (ref 70–118)
Potassium: 3.3 mmol/L (ref 3.3–4.7)
Sodium: 135 mmol/L (ref 128–145)
Total Bilirubin: 0.7 mg/dL (ref 0.2–1.2)
Total Protein: 5.8 g/dL — ABNORMAL LOW (ref 6.4–8.1)

## 2017-04-24 LAB — CBC WITH DIFFERENTIAL (CANCER CENTER ONLY)
Basophils Absolute: 0 10*3/uL (ref 0.0–0.1)
Basophils Relative: 1 %
Eosinophils Absolute: 0 10*3/uL (ref 0.0–0.5)
Eosinophils Relative: 1 %
HCT: 31.4 % — ABNORMAL LOW (ref 38.7–49.9)
Hemoglobin: 10.4 g/dL — ABNORMAL LOW (ref 13.0–17.1)
Lymphocytes Relative: 15 %
Lymphs Abs: 0.6 10*3/uL — ABNORMAL LOW (ref 0.9–3.3)
MCH: 30.3 pg (ref 28.0–33.4)
MCHC: 33.1 g/dL (ref 32.0–35.9)
MCV: 91.5 fL (ref 82.0–98.0)
Monocytes Absolute: 0.5 10*3/uL (ref 0.1–0.9)
Monocytes Relative: 12 %
Neutro Abs: 2.9 10*3/uL (ref 1.5–6.5)
Neutrophils Relative %: 71 %
Platelet Count: 161 10*3/uL (ref 140–400)
RBC: 3.43 MIL/uL — ABNORMAL LOW (ref 4.20–5.70)
RDW: 14.8 % (ref 11.1–15.7)
WBC Count: 4 10*3/uL (ref 4.0–10.3)

## 2017-04-24 MED ORDER — SODIUM CHLORIDE 0.9 % IV SOLN: Freq: Once | INTRAVENOUS | Status: DC

## 2017-04-24 MED ORDER — ONDANSETRON HCL 4 MG/2ML IJ SOLN: 8.0000 mg | Freq: Once | INTRAMUSCULAR | Status: DC

## 2017-04-24 MED ORDER — ONDANSETRON HCL 4 MG/2ML IJ SOLN
8.0000 mg | Freq: Once | INTRAMUSCULAR | Status: AC
  Administered 2017-04-24: 8 mg via INTRAVENOUS

## 2017-04-24 MED ORDER — SODIUM CHLORIDE 0.9 % IV SOLN
Freq: Once | INTRAVENOUS | Status: AC
  Administered 2017-04-24: 13:00:00 via INTRAVENOUS

## 2017-04-24 MED ORDER — SODIUM CHLORIDE 0.9 % IV SOLN
Freq: Once | INTRAVENOUS | Status: DC
  Filled 2017-04-24: qty 4

## 2017-04-24 MED ORDER — SODIUM CHLORIDE 0.9% FLUSH
10.0000 mL | INTRAVENOUS | Status: DC | PRN
  Administered 2017-04-24: 10 mL
  Filled 2017-04-24: qty 10

## 2017-04-24 MED ORDER — SODIUM CHLORIDE 0.9 % IV SOLN
240.0000 mg | Freq: Once | INTRAVENOUS | Status: AC
  Administered 2017-04-24: 240 mg via INTRAVENOUS
  Filled 2017-04-24: qty 24

## 2017-04-24 MED ORDER — ONDANSETRON HCL 4 MG/2ML IJ SOLN
INTRAMUSCULAR | Status: AC
  Filled 2017-04-24: qty 4

## 2017-04-24 MED ORDER — HEPARIN SOD (PORK) LOCK FLUSH 100 UNIT/ML IV SOLN
500.0000 [IU] | Freq: Once | INTRAVENOUS | Status: AC | PRN
  Administered 2017-04-24: 500 [IU]
  Filled 2017-04-24: qty 5

## 2017-04-24 NOTE — Patient Instructions (Signed)
Nivolumab injection What is this medicine? NIVOLUMAB (nye VOL ue mab) is a monoclonal antibody. It is used to treat melanoma, lung cancer, kidney cancer, head and neck cancer, Hodgkin lymphoma, urothelial cancer, colon cancer, and liver cancer. This medicine may be used for other purposes; ask your health care provider or pharmacist if you have questions. COMMON BRAND NAME(S): Opdivo What should I tell my health care provider before I take this medicine? They need to know if you have any of these conditions: -diabetes -immune system problems -kidney disease -liver disease -lung disease -organ transplant -stomach or intestine problems -thyroid disease -an unusual or allergic reaction to nivolumab, other medicines, foods, dyes, or preservatives -pregnant or trying to get pregnant -breast-feeding How should I use this medicine? This medicine is for infusion into a vein. It is given by a health care professional in a hospital or clinic setting. A special MedGuide will be given to you before each treatment. Be sure to read this information carefully each time. Talk to your pediatrician regarding the use of this medicine in children. While this drug may be prescribed for children as young as 12 years for selected conditions, precautions do apply. Overdosage: If you think you have taken too much of this medicine contact a poison control center or emergency room at once. NOTE: This medicine is only for you. Do not share this medicine with others. What if I miss a dose? It is important not to miss your dose. Call your doctor or health care professional if you are unable to keep an appointment. What may interact with this medicine? Interactions have not been studied. Give your health care provider a list of all the medicines, herbs, non-prescription drugs, or dietary supplements you use. Also tell them if you smoke, drink alcohol, or use illegal drugs. Some items may interact with your  medicine. This list may not describe all possible interactions. Give your health care provider a list of all the medicines, herbs, non-prescription drugs, or dietary supplements you use. Also tell them if you smoke, drink alcohol, or use illegal drugs. Some items may interact with your medicine. What should I watch for while using this medicine? This drug may make you feel generally unwell. Continue your course of treatment even though you feel ill unless your doctor tells you to stop. You may need blood work done while you are taking this medicine. Do not become pregnant while taking this medicine or for 5 months after stopping it. Women should inform their doctor if they wish to become pregnant or think they might be pregnant. There is a potential for serious side effects to an unborn child. Talk to your health care professional or pharmacist for more information. Do not breast-feed an infant while taking this medicine. What side effects may I notice from receiving this medicine? Side effects that you should report to your doctor or health care professional as soon as possible: -allergic reactions like skin rash, itching or hives, swelling of the face, lips, or tongue -black, tarry stools -blood in the urine -bloody or watery diarrhea -changes in vision -change in sex drive -changes in emotions or moods -chest pain -confusion -cough -decreased appetite -diarrhea -facial flushing -feeling faint or lightheaded -fever, chills -hair loss -hallucination, loss of contact with reality -headache -irritable -joint pain -loss of memory -muscle pain -muscle weakness -seizures -shortness of breath -signs and symptoms of high blood sugar such as dizziness; dry mouth; dry skin; fruity breath; nausea; stomach pain; increased hunger or thirst; increased  urination -signs and symptoms of kidney injury like trouble passing urine or change in the amount of urine -signs and symptoms of liver injury  like dark yellow or brown urine; general ill feeling or flu-like symptoms; light-colored stools; loss of appetite; nausea; right upper belly pain; unusually weak or tired; yellowing of the eyes or skin -stiff neck -swelling of the ankles, feet, hands -weight gain Side effects that usually do not require medical attention (report to your doctor or health care professional if they continue or are bothersome): -bone pain -constipation -tiredness -vomiting This list may not describe all possible side effects. Call your doctor for medical advice about side effects. You may report side effects to FDA at 1-800-FDA-1088. Where should I keep my medicine? This drug is given in a hospital or clinic and will not be stored at home. NOTE: This sheet is a summary. It may not cover all possible information. If you have questions about this medicine, talk to your doctor, pharmacist, or health care provider.  2018 Elsevier/Gold Standard (2016-01-01 17:49:34)

## 2017-04-25 ENCOUNTER — Ambulatory Visit (INDEPENDENT_AMBULATORY_CARE_PROVIDER_SITE_OTHER): Payer: Medicare Other | Admitting: Gastroenterology

## 2017-04-25 ENCOUNTER — Encounter: Payer: Self-pay | Admitting: Gastroenterology

## 2017-04-25 VITALS — BP 118/72 | HR 74 | Ht 65.5 in | Wt 148.1 lb

## 2017-04-25 DIAGNOSIS — Z86010 Personal history of colonic polyps: Secondary | ICD-10-CM | POA: Diagnosis not present

## 2017-04-25 DIAGNOSIS — D692 Other nonthrombocytopenic purpura: Secondary | ICD-10-CM | POA: Diagnosis not present

## 2017-04-25 DIAGNOSIS — L821 Other seborrheic keratosis: Secondary | ICD-10-CM | POA: Diagnosis not present

## 2017-04-25 DIAGNOSIS — L723 Sebaceous cyst: Secondary | ICD-10-CM | POA: Diagnosis not present

## 2017-04-25 DIAGNOSIS — D225 Melanocytic nevi of trunk: Secondary | ICD-10-CM | POA: Diagnosis not present

## 2017-04-25 DIAGNOSIS — D1801 Hemangioma of skin and subcutaneous tissue: Secondary | ICD-10-CM | POA: Diagnosis not present

## 2017-04-25 DIAGNOSIS — Z85820 Personal history of malignant melanoma of skin: Secondary | ICD-10-CM | POA: Diagnosis not present

## 2017-04-25 NOTE — Patient Instructions (Addendum)
Please return to see Dr. Ardis Hughs in 1 year to discuss polyp surveillance colonoscopy (after you have completed chemo for melanoma). Sooner if any significant bowel issues.  Normal BMI (Body Mass Index- based on height and weight) is between 23 and 30. Your BMI today is Body mass index is 24.27 kg/m. [name] Please consider follow up  regarding your BMI with your Primary Care Provider.

## 2017-04-25 NOTE — Progress Notes (Signed)
Review of pertinent gastrointestinal problems: 1. Adenomatous colon polyps; Colonoscopy Dr. Ardis Hughs 01/2012, two subCM polyps, not adenomas.  several previous colonsocopies with Dr.  Oletta Lamas, most recent was 2007, "fragments of adenoma".  HPI: This is a very pleasant 72 year old man whom I last saw about 5 years ago the time of a surveillance colonoscopy.  See those results summarized above  Chief complaint is history of adenomatous polyps  Blood work November 2018 shows a total iron of 36, TIBC of 198 iron saturation of 18%, ferritin 38.  His hemoglobin is around 11 with an elevated RDW ranging from 18-22.   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  He underwent surgery at that site and then also had a groin lymph node removed.  opdivo IV chemo; he will be getting these every other week for about a year.  Started 2-3 weeks ago.  Felt very sick after the first infusion.  The second infusion went better.  He did notice his bowels have locked up a bit with each of these infusions.  He does not have any rectal bleeding.  His weight has been overall stable  ROS: complete GI ROS as described in HPI, all other review negative.  Constitutional:  No unintentional weight loss   Past Medical History:  Diagnosis Date  . Alcoholism /alcohol abuse ([place])    per family  . Allergic rhinitis   . Anxiety   . Bacterial infection   . CAD (coronary artery disease)    minimal coronary plaque in the LAD and right coronary system. 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 7-06. Last nuclear stress 11-17-06 fixed anterior/ inferior defect, no inducible ischemia, EF 81%  . Chronic back pain    "all over back"  . Chronic neck pain   . Depression   . Diverticulosis   . Dyspnea    with exertion  . Dysrhythmia 01-24-12   past hx. A.Fib x1 episode-responded to med.  . Falls frequently    "since 02/2013" (06/16/2013)  . Family  history of cancer   . Genetic testing 03/06/2017   Multi-Cancer panel (83 genes) @ [facility] - No pathogenic mutations detected  . GERD (gastroesophageal reflux disease)   . Hemochromatosis    dx'd 14 yrs ago last ferritin Aug 11, 08 52 (22-322), Fe 136 ("I had 250 phlebotomies for that")  . High cholesterol    hx  . Hx of colonic polyps   . Hx of colonoscopy   . Hypertension   . Iron deficiency anemia due to chronic blood loss 12/27/2016  . Malignant melanoma of knee, left ([place])   . Myocardial infarction [facility]) 2006   "related to catheterization"  . Narcotic abuse ([place])    per family  . Osteoarthritis   . PONV (postoperative nausea and vomiting)   . RA (rheumatoid arthritis) ([place])     Past Surgical History:  Procedure Laterality Date  . ABDOMINAL ADHESION SURGERY  ~ 1968  . ANKLE RECONSTRUCTION Right 6-09   [facility]  . APPENDECTOMY  ~ 1956  . ASPIRATION OF ABSCESS Left 03/27/2017   Procedure: ASPIRATION OF SEROMA;  Surgeon: Stark Klein, MD;  Location: [facility];  Service: General;  Laterality: Left;  . BONE TUMOR RESECTION  ~ 1954   "taken off my mastoid"  . CARPAL TUNNEL RELEASE Right 1990's  . CATARACT EXTRACTION, BILATERAL Bilateral 01-24-12  . CORONARY ANGIOPLASTY WITH STENT PLACEMENT  2006   "while  repairing 1st stent, a second area tore and they had to place 2nd stent " ?LAD & CX  . CORONARY ANGIOPLASTY WITH STENT PLACEMENT  06/17/2014  . CYST EXCISION  "several OR's"   "backX 2, back of my neck, face, inside right bicept, chest, wrist"  . FOOT SURGERY Right 11-08   for removal of bone spurs-  . HAMMER TOE SURGERY Right 07/2012   "broke 4 hammertoes"   . HARDWARE REMOVAL  03/09/2012   Procedure: HARDWARE REMOVAL;  Surgeon: Nita Sells, MD;  Location: [place] [facility];  Service: Orthopedics;  Laterality: Left;  Hardware Removal from Left Shoulder  . HARVEST BONE GRAFT  02/06/2012   Procedure: HARVEST ILIAC BONE GRAFT;  Surgeon: Nita Sells, MD;  Location: [facility];  Service: Orthopedics;;  bone marrow aspirqation   . INGUINAL HERNIA REPAIR Bilateral   . JOINT REPLACEMENT    . KNEE SURGERY Left ~ 2003   "6-12 months after uni knee removed synovial sack"  . LEFT HEART CATHETERIZATION WITH CORONARY ANGIOGRAM N/A 06/17/2014   PCI of diffuse severe stenosis in the proximal to mid LAD using overlapping drug-eluting stents.  . LUMBAR WOUND DEBRIDEMENT N/A 03/17/2013   Procedure: Incision and drainage of superficial lumbar wound;  Surgeon: Floyce Stakes, MD;  Location: [place];  Service: Neurosurgery;  Laterality: N/A;  Incision and drainage of superficial lumbar wound  . MECKEL DIVERTICULUM EXCISION  ~ 1956  . MELANOMA EXCISION WITH SENTINEL LYMPH NODE BIOPSY Left 02/12/2017   WIDE LOCAL EXCISION LEFT KNEE MELANOMA, ADVANCEMENT FLAP CLOSURE, AND SENTINEL LYMPH NODE MAPPING AND BIOPSY.  [name] MELANOMA EXCISION WITH SENTINEL LYMPH NODE BIOPSY Left 02/12/2017   Procedure: WIDE LOCAL EXCISION LEFT KNEE MELANOMA, ADVANCEMENT FLAP CLOSURE, AND SENTINEL LYMPH NODE MAPPING AND BIOPSY.;  Surgeon: Stark Klein, MD;  Location: [place];  Service: General;  Laterality: Left;  GENERAL AND LOCAL  . ORIF SHOULDER FRACTURE  02/06/2012   Procedure: OPEN REDUCTION INTERNAL FIXATION (ORIF) SHOULDER FRACTURE;  Surgeon: Nita Sells, MD;  Location: [facility];  Service: Orthopedics;  Laterality: Left;  ORIF of a Left Shoulder Fracture with  Iliac Crest Bone Graft aspiration   . PORTACATH PLACEMENT Left 03/27/2017   Procedure: INSERTION PORT-A-CATH;  Surgeon: Stark Klein, MD;  Location: [facility];  Service: General;  Laterality: Left;  . POSTERIOR LUMBAR FUSION  12-10   L4-5 diskectomy w/ fusion, cage placement and rods; [place]  . POSTERIOR LUMBAR FUSION 4 LEVEL N/A 03/02/2013   Procedure: Lumbar One to Sacral One Posterior lumbar interbody fusion;  Surgeon: Floyce Stakes, MD;  Location: [place];  Service:  Neurosurgery;  Laterality: N/A;  L1 to S1 Posterior lumbar interbody fusion  . REPLACEMENT UNICONDYLAR JOINT KNEE Left ~ 2003   "~ 6 months after total knee replaced"  . SHOULDER ARTHROSCOPY Left ~ 2004 X 2   "@ [facility]; left bone splinter in & had to clean it out"  . TOTAL ANKLE REPLACEMENT Right 2008   at [facility]  . TOTAL KNEE ARTHROPLASTY Bilateral 2002  . TOTAL SHOULDER REPLACEMENT Left 2006  . TOTAL SHOULDER REPLACEMENT Right ~ 2007   Dr. Marlou Sa    Current Outpatient Medications  Medication Sig Dispense Refill  . albuterol (PROVENTIL HFA;VENTOLIN HFA) 108 (90 Base) MCG/ACT inhaler Inhale 1-2 puffs into the lungs every 4 (four) hours as needed for wheezing or shortness of breath. 1 Inhaler 1  . alfuzosin (UROXATRAL) 10 MG 24 hr tablet Take 10  mg by mouth at bedtime.   0  . ALPRAZolam (XANAX) 0.5 MG tablet Take 1 tablet (0.5 mg total) by mouth 2 (two) times daily as needed. for anxiety 60 tablet 3  . Cholecalciferol (VITAMIN D3) 2000 UNITS capsule Take 1 capsule (2,000 Units total) by mouth daily. 100 capsule 3  . clopidogrel (PLAVIX) 75 MG tablet TAKE 1 TABLET BY MOUTH EVERY DAY WITH BREAKFAST 90 tablet 3  . COLCRYS 0.6 MG tablet Take 1 tablet by mouth every evening.   2  . FLUoxetine (PROZAC) 40 MG capsule Take 1 capsule (40 mg total) by mouth 2 (two) times daily. 180 capsule 2  . fluticasone (FLONASE) 50 MCG/ACT nasal spray INSTILL 2 SPRAYS IN EACH NOSTRIL EVERY DAY 16 g 11  . gabapentin (NEURONTIN) 300 MG capsule Take 300 mg by mouth 2 (two) times daily.    [name] HYDROmorphone (DILAUDID) 2 MG tablet Take 1 tablet (2 mg total) by mouth every 8 (eight) hours as needed for severe pain. 120 tablet 0  . linaclotide (LINZESS) 290 MCG CAPS capsule Take 1 capsule (290 mcg total) by mouth daily before breakfast. 90 capsule 3  . losartan-hydrochlorothiazide (HYZAAR) 50-12.5 MG tablet TAKE 1 TABLET BY MOUTH DAILY 90 tablet 0  . Melatonin 5 MG TABS Take 5 mg by mouth at bedtime.    . metoprolol tartrate  (LOPRESSOR) 25 MG tablet Take 1 tablet (25 mg total) by mouth 2 (two) times daily. 180 tablet 1  . Omega-3 Fatty Acids (FISH OIL OMEGA-3) 1000 MG CAPS Take 1,000 mg by mouth daily.     . ondansetron (ZOFRAN) 8 MG tablet Take 1 tablet (8 mg total) by mouth every 8 (eight) hours as needed for nausea or vomiting. 30 tablet 1  . pantoprazole (PROTONIX) 40 MG tablet Take 1 tablet (40 mg total) by mouth every evening. 90 tablet 1  . pravastatin (PRAVACHOL) 20 MG tablet Take 1 tablet (20 mg total) by mouth daily. (Patient taking differently: Take 20 mg by mouth every evening. ) 90 tablet 1  . predniSONE (DELTASONE) 5 MG tablet Take 5 mg by mouth daily with breakfast.    . prochlorperazine (COMPAZINE) 10 MG tablet TAKE 1 TABLET(10 MG) BY MOUTH EVERY 6 HOURS AS NEEDED FOR NAUSEA OR VOMITING 385 tablet 1  . tadalafil (CIALIS) 5 MG tablet Take 5 mg by mouth as needed.    . vitamin B-12 (CYANOCOBALAMIN) 1000 MCG tablet Take 1,000 mcg by mouth daily.      No current facility-administered medications for this visit.     Allergies as of 04/25/2017 - Review Complete 04/25/2017  Allergen Reaction Noted  . Cefepime Hives and Shortness Of Breath 11/19/2013  . Morphine and related Shortness Of Breath, Nausea And Vomiting, Swelling, and Other (See Comments) 04/10/2011  . Penicillins Hives, Shortness Of Breath, and Rash     Family History  Problem Relation Age of Onset  . Uterine cancer Mother        uterine vs. cervix? dx 50s; deceased 92  . Macular degeneration Mother   . Other Mother        ankle edema  . Lung cancer Mother   . Melanoma Mother        multiple spots on legs  . Cancer - Lung Mother        smoker  . Coronary artery disease Father   . Hypertension Father   . Prostate cancer Father   . Colon polyps Father   . Cancer - Prostate Father  34       deceased 66  . Heart attack Brother   . Hyperlipidemia Brother   . Other Brother        Schizophrenic  . Thyroid disease Sister   .  Hemochromatosis Sister   . Rheum arthritis Sister   . Coronary artery disease Maternal Aunt   . Heart attack Maternal Aunt   . Cancer Maternal Grandmother        unk. type; deceased 4s  . Diabetes Neg Hx   . Colon cancer Neg Hx   . Esophageal cancer Neg Hx   . Rectal cancer Neg Hx   . Stomach cancer Neg Hx     Social History   Socioeconomic History  . Marital status: Divorced    Spouse name: Not on file  . Number of children: 2  . Years of education: 72  . Highest education level: Not on file  Social Needs  . Financial resource strain: Not on file  . Food insecurity - worry: Not on file  . Food insecurity - inability: Not on file  . Transportation needs - medical: Not on file  . Transportation needs - non-medical: Not on file  Occupational History  . 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    Comment: self employed  Tobacco Use  . Smoking status: Never Smoker  . Smokeless tobacco: Never Used  Substance and Sexual Activity  . Alcohol use: No    Alcohol/week: 0.0 oz    Comment: "I do not drink anymore"  . Drug use: No  . Sexual activity: Yes    Partners: Female  Other Topics Concern  . Not on file  Social History Narrative   [employer] [place]. Married 1972-22yr seperated/divorced. Engaged April '11. 1 son '74; step-daughter '70. 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 During '11 discovered an [employer]- $500,000+ loss. Is handling this OK - will keep the business running.     Physical Exam: BP 118/72   Pulse 74   Ht 5' 5.5" (1.664 m)   Wt 148 lb 2 oz (67.2 kg)   BMI 24.27 kg/m  Constitutional: generally well-appearing Psychiatric: alert and oriented x3 Abdomen: soft, nontender, nondistended, no obvious ascites, no peritoneal signs, normal bowel sounds No peripheral edema noted in lower extremities  Assessment and plan: 72 y.o. male with history of adenomatous colon polyps  We met to discuss surveillance colonoscopy and I explained to him that I do not think that he should go ahead with this since he is currently getting chemotherapy for melanoma.  He is also on Plavix which will need to be held.  I think it is very unlikely has anything serious going on his colon.  Indeed the last colonoscopy about 5 years ago found no precancerous polyps.  He had had precancerous polyps a bit more remotely with a different gastroenterologist he tells me he was undergoing colonoscopy every other year for a brief time.  I recommended he come back in about a year from now when his 1 year chemotherapy course is completed and at that point we can rediscuss this same issue.  Please see the "Patient Instructions" section for addition details about the plan.  DOwens Loffler MD [facility] 04/25/2017, 10:08 AM

## 2017-04-29 ENCOUNTER — Encounter: Payer: Self-pay | Admitting: Hematology & Oncology

## 2017-04-30 ENCOUNTER — Telehealth: Payer: Self-pay | Admitting: Internal Medicine

## 2017-04-30 NOTE — Telephone Encounter (Signed)
Copied from [place]. Topic: Quick Communication - Rx Refill/Question >> Apr 30, 2017  2:46 PM Percell Belt A wrote: Medication: HYDROmorphone (DILAUDID) 2 MG tablet [[id number]]   Has the patient contacted their pharmacy? No    (Agent: If no, request that the patient contact the pharmacy for the refill.)   Preferred Pharmacy (with phone number or street name):  [facility] on [address] , pt called and said that pharmacy did not get this script on the 14th    Agent: Please be advised that RX refills may take up to 3 business days. We ask that you follow-up with your pharmacy.

## 2017-04-30 NOTE — Telephone Encounter (Signed)
I am not sure if I understand the message.  Rx to be filled every 30 days. Thx

## 2017-05-01 ENCOUNTER — Telehealth: Payer: Self-pay | Admitting: Internal Medicine

## 2017-05-01 ENCOUNTER — Other Ambulatory Visit: Payer: Medicare Other

## 2017-05-01 DIAGNOSIS — M47812 Spondylosis without myelopathy or radiculopathy, cervical region: Secondary | ICD-10-CM | POA: Diagnosis not present

## 2017-05-01 NOTE — Telephone Encounter (Signed)
Copied from [place] [phone]. Topic: Quick Communication - Rx Refill/Question >> May 01, 2017  1:06 PM Arletha Grippe wrote: Medication: HYDROmorphone (DILAUDID) 2 MG tablet #120   Has the patient contacted their pharmacy? Yes.     (Agent: If no, request that the patient contact the pharmacy for the refill.)   Preferred Pharmacy (with phone number or street name): [facility] [address]  Pt states that electronic scrip did not go through, pharm can see where the attempt was made   Pt is having electronic procedure done today, would like to have filled today. Pt is out of medication  Agent: Please be advised that RX refills may take up to 3 business days. We ask that you follow-up with your pharmacy.

## 2017-05-01 NOTE — Telephone Encounter (Signed)
New script for #120 must be sent to pharmacy.Marland KitchenJohny [name]

## 2017-05-01 NOTE — Telephone Encounter (Signed)
Ok 120 Thx

## 2017-05-01 NOTE — Telephone Encounter (Signed)
Duplicate see previous msg../lmb

## 2017-05-01 NOTE — Telephone Encounter (Signed)
Check [place] registry last filled Hydromorphone on 04/25/2017 for # 20 tabs which was only for 6 days. The pharmacy did not give the total amount of 120 which you ordered so now the patient is out of medication.Marland KitchenJohny [name]

## 2017-05-02 MED ORDER — HYDROMORPHONE HCL 2 MG PO TABS: 2.0000 mg | ORAL_TABLET | Freq: Three times a day (TID) | ORAL | 0 refills | Status: DC | PRN

## 2017-05-02 NOTE — Telephone Encounter (Signed)
Spoke with pt and pharmacy and RX currently written for a 40 day supply, last sig before the current one was 1-2 tablets every 8 hours. RX needs to say this in order for patient to get 120 tablets. Please advise

## 2017-05-02 NOTE — Telephone Encounter (Signed)
Done. Thx.

## 2017-05-02 NOTE — Telephone Encounter (Signed)
#  120 is a 30 d supply. He is aware how to use his Rx Thx

## 2017-05-02 NOTE — Telephone Encounter (Signed)
Pharmacy requesting to speak with CMA regarding dosage, patient states he takes 4 times a day.  Please advise

## 2017-05-02 NOTE — Telephone Encounter (Signed)
Notified pt MD resent another script. ? Patient why they only gave him 20 tabs whn the rx has #120. Per patient the 20 tabs was rx by his surgeon, but the pharmacy filled it under Dr. Alain Marion name. He states pharmacy told him they never received dr.plot script form 04/21/17...[name]

## 2017-05-06 ENCOUNTER — Encounter: Payer: Medicare Other | Admitting: Genetics

## 2017-05-06 ENCOUNTER — Other Ambulatory Visit: Payer: Medicare Other

## 2017-05-08 ENCOUNTER — Inpatient Hospital Stay (HOSPITAL_BASED_OUTPATIENT_CLINIC_OR_DEPARTMENT_OTHER): Payer: Medicare Other | Admitting: Family

## 2017-05-08 ENCOUNTER — Encounter: Payer: Self-pay | Admitting: Family

## 2017-05-08 ENCOUNTER — Other Ambulatory Visit: Payer: Self-pay

## 2017-05-08 ENCOUNTER — Inpatient Hospital Stay: Payer: Medicare Other

## 2017-05-08 DIAGNOSIS — E032 Hypothyroidism due to medicaments and other exogenous substances: Secondary | ICD-10-CM

## 2017-05-08 DIAGNOSIS — R0602 Shortness of breath: Secondary | ICD-10-CM | POA: Diagnosis not present

## 2017-05-08 DIAGNOSIS — Z79899 Other long term (current) drug therapy: Secondary | ICD-10-CM | POA: Diagnosis not present

## 2017-05-08 DIAGNOSIS — C4372 Malignant melanoma of left lower limb, including hip: Secondary | ICD-10-CM

## 2017-05-08 DIAGNOSIS — J449 Chronic obstructive pulmonary disease, unspecified: Secondary | ICD-10-CM | POA: Diagnosis not present

## 2017-05-08 DIAGNOSIS — D509 Iron deficiency anemia, unspecified: Secondary | ICD-10-CM

## 2017-05-08 DIAGNOSIS — R531 Weakness: Secondary | ICD-10-CM

## 2017-05-08 DIAGNOSIS — D500 Iron deficiency anemia secondary to blood loss (chronic): Secondary | ICD-10-CM

## 2017-05-08 DIAGNOSIS — Z5112 Encounter for antineoplastic immunotherapy: Secondary | ICD-10-CM | POA: Diagnosis not present

## 2017-05-08 LAB — CMP (CANCER CENTER ONLY)
ALT: 21 U/L (ref 0–55)
AST: 19 U/L (ref 5–34)
Albumin: 3 g/dL — ABNORMAL LOW (ref 3.5–5.0)
Alkaline Phosphatase: 74 U/L (ref 26–84)
Anion gap: 6 (ref 5–15)
BUN: 18 mg/dL (ref 7–22)
CO2: 29 mmol/L (ref 18–33)
Calcium: 8.5 mg/dL (ref 8.0–10.3)
Chloride: 98 mmol/L (ref 98–108)
Creatinine: 0.8 mg/dL (ref 0.70–1.30)
Glucose, Bld: 118 mg/dL (ref 73–118)
Potassium: 3.8 mmol/L (ref 3.3–4.7)
Sodium: 133 mmol/L (ref 128–145)
Total Bilirubin: 0.7 mg/dL (ref 0.2–1.2)
Total Protein: 5.8 g/dL — ABNORMAL LOW (ref 6.4–8.1)

## 2017-05-08 LAB — CBC WITH DIFFERENTIAL (CANCER CENTER ONLY)
Basophils Absolute: 0 10*3/uL (ref 0.0–0.1)
Basophils Relative: 0 %
Eosinophils Absolute: 0 10*3/uL (ref 0.0–0.5)
Eosinophils Relative: 0 %
HCT: 32.1 % — ABNORMAL LOW (ref 38.7–49.9)
Hemoglobin: 10.4 g/dL — ABNORMAL LOW (ref 13.0–17.1)
Lymphocytes Relative: 21 %
Lymphs Abs: 1.5 10*3/uL (ref 0.9–3.3)
MCH: 29.4 pg (ref 28.0–33.4)
MCHC: 32.4 g/dL (ref 32.0–35.9)
MCV: 90.7 fL (ref 82.0–98.0)
Monocytes Absolute: 1.2 10*3/uL — ABNORMAL HIGH (ref 0.1–0.9)
Monocytes Relative: 17 %
Neutro Abs: 4.4 10*3/uL (ref 1.5–6.5)
Neutrophils Relative %: 62 %
Platelet Count: 282 10*3/uL (ref 145–400)
RBC: 3.54 MIL/uL — ABNORMAL LOW (ref 4.20–5.70)
RDW: 13.5 % (ref 11.1–15.7)
WBC Count: 7.1 10*3/uL (ref 4.0–10.0)

## 2017-05-08 LAB — FERRITIN: Ferritin: 42 ng/mL (ref 22–316)

## 2017-05-08 LAB — IRON AND TIBC
Iron: 24 ug/dL — ABNORMAL LOW (ref 42–163)
Saturation Ratios: 11 % — ABNORMAL LOW (ref 42–163)
TIBC: 210 ug/dL (ref 202–409)
UIBC: 186 ug/dL

## 2017-05-08 LAB — TSH: TSH: 0.713 u[IU]/mL (ref 0.320–4.118)

## 2017-05-08 MED ORDER — HEPARIN SOD (PORK) LOCK FLUSH 100 UNIT/ML IV SOLN
500.0000 [IU] | Freq: Once | INTRAVENOUS | Status: AC | PRN
  Administered 2017-05-08: 500 [IU]
  Filled 2017-05-08: qty 5

## 2017-05-08 MED ORDER — SODIUM CHLORIDE 0.9 % IV SOLN
240.0000 mg | Freq: Once | INTRAVENOUS | Status: AC
  Administered 2017-05-08: 240 mg via INTRAVENOUS
  Filled 2017-05-08: qty 24

## 2017-05-08 MED ORDER — SODIUM CHLORIDE 0.9% FLUSH
10.0000 mL | INTRAVENOUS | Status: DC | PRN
  Administered 2017-05-08: 10 mL
  Filled 2017-05-08: qty 10

## 2017-05-08 MED ORDER — SODIUM CHLORIDE 0.9 % IV SOLN
Freq: Once | INTRAVENOUS | Status: AC
  Administered 2017-05-08: 11:00:00 via INTRAVENOUS

## 2017-05-08 MED ORDER — SODIUM CHLORIDE 0.9% FLUSH
3.0000 mL | INTRAVENOUS | Status: DC | PRN
  Filled 2017-05-08: qty 10

## 2017-05-08 NOTE — Patient Instructions (Signed)
[facility] Discharge Instructions for Patients Receiving Chemotherapy  Today you received the following chemotherapy agents Nivolumab To help prevent nausea and vomiting after your treatment, we encourage you to take your nausea medication as prescribed.   If you develop nausea and vomiting that is not controlled by your nausea medication, call the clinic.   BELOW ARE SYMPTOMS THAT SHOULD BE REPORTED IMMEDIATELY:  *FEVER GREATER THAN 100.5 F  *CHILLS WITH OR WITHOUT FEVER  NAUSEA AND VOMITING THAT IS NOT CONTROLLED WITH YOUR NAUSEA MEDICATION  *UNUSUAL SHORTNESS OF BREATH  *UNUSUAL BRUISING OR BLEEDING  TENDERNESS IN MOUTH AND THROAT WITH OR WITHOUT PRESENCE OF ULCERS  *URINARY PROBLEMS  *BOWEL PROBLEMS  UNUSUAL RASH Items with * indicate a potential emergency and should be followed up as soon as possible.  Feel free to call the clinic should you have any questions or concerns. The clinic phone number is [phone].  Please show the [place] at check-in to the Emergency Department and triage nurse.

## 2017-05-08 NOTE — Progress Notes (Signed)
Hematology and Oncology Follow Up Visit  [name] [id number] February 03, 1946 72 y.o. 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 He is doing well and has no complaints at this time.  He has had the left groin seroma drained several times and states his surgeon may put a drain back in at his next visit. It is palpable on exam, approximately 2 in x 1 in in size.  His left inner thigh incision has healed nicely. No redness, edema or sign of infection.   He states that he had a little nausea after his last treatment that resolved with antiemetics.  His SOB secondary to COPD is unchanged.  No fever, chills, n/v, cough, rash, dizziness, chest pain, palpitations, abdominal pain or changes in bowel or bladder habits.  No episodes of bleeding, no bruising or petechiae. No lymphadenopathy found on exam.  No swelling, tenderness, numbness or tingling in his extremities.  He has generalized aches and pains with arthritis.  He has maintained a good appetite and is staying hydrated. His weight is stable.   ECOG Performance Status: 1 - Symptomatic but completely ambulatory  Medications:  Allergies as of 05/08/2017      Reactions   Cefepime Hives, Shortness Of Breath   Morphine And Related Shortness Of Breath, Nausea And Vomiting, Swelling, Other (See Comments)   Agitation, tolerates dilaudid   Penicillins Hives, Shortness Of Breath, Rash   Has patient had a PCN reaction causing immediate rash, facial/tongue/throat swelling, SOB or lightheadedness with hypotension: Yes Has patient had a PCN reaction causing severe rash  involving mucus membranes or skin necrosis: Yes Has patient had a PCN reaction that required hospitalization No Has patient had a PCN reaction occurring within the last 10 years: No If all of the above answers are "NO", then may proceed with Cephalosporin use.      Medication List        Accurate as of 05/08/17 12:02 PM. Always use your most recent med list.          albuterol 108 (90 Base) MCG/ACT inhaler Commonly known as:  PROVENTIL HFA;VENTOLIN HFA Inhale 1-2 puffs into the lungs every 4 (four) hours as needed for wheezing or shortness of breath.   alfuzosin 10 MG 24 hr tablet Commonly known as:  UROXATRAL Take 10 mg by mouth at bedtime.   ALPRAZolam 0.5 MG tablet Commonly known as:  XANAX Take 1 tablet (0.5 mg total) by mouth 2 (two) times daily as needed. for anxiety   CIALIS 5 MG tablet Generic drug:  tadalafil Take 5 mg by mouth as needed.   clopidogrel 75 MG tablet Commonly known as:  PLAVIX TAKE 1 TABLET BY MOUTH EVERY DAY WITH BREAKFAST   COLCRYS 0.6 MG tablet Generic drug:  colchicine Take 1 tablet by mouth every evening.   FISH OIL OMEGA-3 1000 MG Caps Take 1,000 mg by mouth daily.   FLUoxetine 40 MG capsule Commonly known as:  PROZAC Take 1 capsule (40 mg total) by mouth 2 (two) times daily.   fluticasone 50 MCG/ACT nasal spray Commonly known as:  FLONASE INSTILL 2 SPRAYS  IN EACH NOSTRIL EVERY DAY   gabapentin 300 MG capsule Commonly known as:  NEURONTIN Take 300 mg by mouth 2 (two) times daily.   HYDROmorphone 2 MG tablet Commonly known as:  DILAUDID Take 1 tablet (2 mg total) by mouth every 8 (eight) hours as needed for severe pain.   linaclotide 290 MCG Caps capsule Commonly known as:  LINZESS Take 1 capsule (290 mcg total) by mouth daily before breakfast.   losartan-hydrochlorothiazide 50-12.5 MG tablet Commonly known as:  HYZAAR TAKE 1 TABLET BY MOUTH DAILY   Melatonin 5 MG Tabs Take 5 mg by mouth at bedtime.   metoprolol tartrate  25 MG tablet Commonly known as:  LOPRESSOR Take 1 tablet (25 mg total) by mouth 2 (two) times daily.   ondansetron 8 MG tablet Commonly known as:  ZOFRAN Take 1 tablet (8 mg total) by mouth every 8 (eight) hours as needed for nausea or vomiting.   pantoprazole 40 MG tablet Commonly known as:  PROTONIX Take 1 tablet (40 mg total) by mouth every evening.   pravastatin 20 MG tablet Commonly known as:  PRAVACHOL Take 1 tablet (20 mg total) by mouth daily.   predniSONE 5 MG tablet Commonly known as:  DELTASONE Take 5 mg by mouth daily with breakfast.   prochlorperazine 10 MG tablet Commonly known as:  COMPAZINE TAKE 1 TABLET(10 MG) BY MOUTH EVERY 6 HOURS AS NEEDED FOR NAUSEA OR VOMITING   vitamin B-12 1000 MCG tablet Commonly known as:  CYANOCOBALAMIN Take 1,000 mcg by mouth daily.   Vitamin D3 2000 units capsule Take 1 capsule (2,000 Units total) by mouth daily.       Allergies:  Allergies  Allergen Reactions  . Cefepime Hives and Shortness Of Breath  . Morphine And Related Shortness Of Breath, Nausea And Vomiting, Swelling and Other (See Comments)    Agitation, tolerates dilaudid  . Penicillins Hives, Shortness Of Breath and Rash    Has patient had a PCN reaction causing immediate rash, facial/tongue/throat swelling, SOB or lightheadedness with hypotension: Yes Has patient had a PCN reaction causing severe rash involving mucus membranes or skin necrosis: Yes Has patient had a PCN reaction that required hospitalization No Has patient had a PCN reaction occurring within the last 10 years: No If all of the above answers are "NO", then may proceed with Cephalosporin use.     Past Medical History, Surgical history, Social history, and Family History were reviewed and updated.  Review of Systems: All other 10 point review of systems is negative.   Physical Exam:  weight is 147 lb (66.7 kg). His oral temperature is 98.1 F (36.7 C). His blood pressure is 140/71 and his  pulse is 58 (abnormal). His oxygen saturation is 100%.   Wt Readings from Last 3 Encounters:  05/08/17 147 lb (66.7 kg)  05/08/17 147 lb (66.7 kg)  04/25/17 148 lb 2 oz (67.2 kg)    Ocular: Sclerae unicteric, pupils equal, round and reactive to light Ear-nose-throat: Oropharynx clear, dentition fair Lymphatic: No cervical, supraclavicular or axillary adenopathy Lungs no rales or rhonchi, good excursion bilaterally Heart regular rate and rhythm, no murmur appreciated Abd soft, nontender, positive bowel sounds, no liver or spleen tip palpated on exam, no fluid wave  MSK no focal spinal tenderness, no joint edema Neuro: non-focal, well-oriented, appropriate affect Breasts: Deferred    Lab Results  Component Value Date   WBC 7.1 05/08/2017   HGB 10.5 (L) 04/10/2017   HCT 32.1 (L) 05/08/2017  MCV 90.7 05/08/2017   PLT 282 05/08/2017   Lab Results  Component Value Date   FERRITIN 29 04/10/2017   IRON 33 (L) 04/10/2017   TIBC 204 04/10/2017   UIBC 170 04/10/2017   IRONPCTSAT 16 (L) 04/10/2017   Lab Results  Component Value Date   RETICCTPCT 1.9 03/24/2013   RBC 3.54 (L) 05/08/2017   No results found for: KPAFRELGTCHN, LAMBDASER, KAPLAMBRATIO No results found for: Kandis Cocking, IGMSERUM No results found for: Odetta Pink, SPEI   Chemistry      Component Value Date/Time   NA 133 05/08/2017 0950   NA 140 04/10/2017 1046   NA 135 (L) 03/06/2017 1104   K 3.8 05/08/2017 0950   K 3.7 04/10/2017 1046   K 4.2 03/06/2017 1104   CL 98 05/08/2017 0950   CL 98 04/10/2017 1046   CO2 29 05/08/2017 0950   CO2 31 04/10/2017 1046   CO2 30 (H) 03/06/2017 1104   BUN 18 05/08/2017 0950   BUN 21 04/10/2017 1046   BUN 20.0 03/06/2017 1104   CREATININE 0.9 04/10/2017 1046   CREATININE 1.0 03/06/2017 1104      Component Value Date/Time   CALCIUM 8.5 05/08/2017 0950   CALCIUM 8.9 04/10/2017 1046   CALCIUM 9.4 03/06/2017 1104    ALKPHOS 74 05/08/2017 0950   ALKPHOS 65 04/10/2017 1046   ALKPHOS 78 03/06/2017 1104   AST 19 05/08/2017 0950   AST 20 03/06/2017 1104   ALT 21 05/08/2017 0950   ALT 21 04/10/2017 1046   ALT 20 03/06/2017 1104   BILITOT 0.7 05/08/2017 0950   BILITOT 0.47 03/06/2017 1104      Impression and Plan: Mr. Clouatre is a very pleasant 72 yo caucasian gentleman with history of hemochromatosis (homozygous for C282Y mutation) followed by iron deficiency anemia and now stage IIIC (T2N2M0) nodular melanoma of the left inner thigh.  He is doing well so far with treatment and has no complaints at this time. We will proceed with treatment today as planned.  We will see what his iron studies show and bring him back in for further treatment if needed.  He has his current treatment and appointment schedule and we will see him back on 2/14.  Both he and his wife know to contact our office with any questions or concerns. We can certainly see him sooner if need be.   Laverna Peace, NP 1/31/201912:02 PM

## 2017-05-08 NOTE — Patient Instructions (Signed)
Dialysis Vascular Access Malfunction A vascular access is an entrance to your blood vessels that can be used for dialysis. A vascular access can be made in one of several ways:  Joining an artery to a vein under your skin to make a bigger blood vessel called a fistula.  Joining an artery to a vein under your skin using a soft tube called a graft.  Placing a thin, flexible tube (catheter) in a large vein, usually in your neck.  A vascular access may malfunction or become blocked. What can cause your vascular access to malfunction?  Infection (common).  A blood clot inside a part of the fistula, graft, or catheter. A blood clot can completely or partially block the flow of blood.  A kink in the graft or catheter.  A collection of blood (called a hematoma or bruise) next to the graft or catheter that pushes against it, blocking the flow of blood. What are signs and symptoms of vascular access malfunction?  There is a change in the vibration or pulse of your fistula or graft.  The vibration or pulse of your fistula or graft is gone.  There is new or unusual swelling of the area around the access.  There was an unsuccessful puncture of your access by the dialysis team.  The flow of blood through the fistula, graft, or catheter is too slow for effective dialysis.  When routine dialysis is completed and the needle is removed, bleeding lasts for too long a time. What happens if my vascular access malfunctions? Your health care provider may order blood work, cultures, or an X-ray test in order to learn what may be wrong with your vascular access. The X-ray test involves the injection of a liquid into the vascular access. The liquid shows up on the X-ray and allows your health care provider to see if there is a blockage in the vascular access. Treatment varies depending on the cause of the malfunction:  If the vascular access is infected, your health care provider may prescribe antibiotic  medicine to control the infection.  If a clot is found in the vascular access, you may need surgery to remove the clot.  If a blockage in the vascular access is due to some other cause (such as a kink in a graft), then you will likely need surgery to unblock or replace the graft.  Follow these instructions at home: Follow up with your surgeon or other health care provider if you were instructed to do so. This is very important. Any delay in follow-up could cause permanent dysfunction of the vascular access, which may be dangerous. Contact a health care provider if:  Fever develops.  Swelling and pain around the vascular access gets worse or new pain develops.  Pain, numbness, or an unusual pale skin color develops in the hand on the side of your vascular access. Get help right away if: Unusual bleeding develops at the location of the vascular access. This information is not intended to replace advice given to you by your health care provider. Make sure you discuss any questions you have with your health care provider. Document Released: 02/25/2006 Document Revised: 08/31/2015 Document Reviewed: 08/27/2012 Elsevier Interactive Patient Education  2017 [employer].

## 2017-05-14 ENCOUNTER — Other Ambulatory Visit: Payer: Medicare Other

## 2017-05-14 ENCOUNTER — Encounter: Payer: Medicare Other | Admitting: Genetic Counselor

## 2017-05-15 ENCOUNTER — Telehealth: Payer: Self-pay | Admitting: *Deleted

## 2017-05-15 MED ORDER — TRAZODONE HCL 100 MG PO TABS: 100.0000 mg | ORAL_TABLET | Freq: Every evening | ORAL | 3 refills | Status: DC | PRN

## 2017-05-15 NOTE — Telephone Encounter (Signed)
Patient is c/o difficulty sleeping. He is unable to fall asleep until 2am and then doesn't sleep well. He had this issue years ago and states that trazodone 166m qhs helped him. He'd like to know if Dr EMarin Olpwill prescribe this for him.   Spoke with Dr EMarin Olpand he will prescribe trazodone for the patient.   Patient aware of new prescription and pharmacy confirmed.

## 2017-05-21 ENCOUNTER — Other Ambulatory Visit: Payer: Self-pay | Admitting: *Deleted

## 2017-05-21 MED ORDER — GABAPENTIN 300 MG PO CAPS: 300.0000 mg | ORAL_CAPSULE | Freq: Two times a day (BID) | ORAL | 1 refills | Status: DC

## 2017-05-22 ENCOUNTER — Inpatient Hospital Stay: Payer: Medicare Other

## 2017-05-22 ENCOUNTER — Inpatient Hospital Stay (HOSPITAL_BASED_OUTPATIENT_CLINIC_OR_DEPARTMENT_OTHER): Payer: Medicare Other | Admitting: Family

## 2017-05-22 ENCOUNTER — Inpatient Hospital Stay: Payer: Medicare Other | Attending: Hematology & Oncology

## 2017-05-22 ENCOUNTER — Other Ambulatory Visit: Payer: Self-pay

## 2017-05-22 DIAGNOSIS — J449 Chronic obstructive pulmonary disease, unspecified: Secondary | ICD-10-CM | POA: Diagnosis not present

## 2017-05-22 DIAGNOSIS — Z79899 Other long term (current) drug therapy: Secondary | ICD-10-CM | POA: Diagnosis not present

## 2017-05-22 DIAGNOSIS — D500 Iron deficiency anemia secondary to blood loss (chronic): Secondary | ICD-10-CM

## 2017-05-22 DIAGNOSIS — C4372 Malignant melanoma of left lower limb, including hip: Secondary | ICD-10-CM

## 2017-05-22 DIAGNOSIS — E032 Hypothyroidism due to medicaments and other exogenous substances: Secondary | ICD-10-CM

## 2017-05-22 DIAGNOSIS — Z5112 Encounter for antineoplastic immunotherapy: Secondary | ICD-10-CM | POA: Insufficient documentation

## 2017-05-22 LAB — CMP (CANCER CENTER ONLY)
ALT: 15 U/L (ref 10–47)
AST: 19 U/L (ref 11–38)
Albumin: 3.4 g/dL — ABNORMAL LOW (ref 3.5–5.0)
Alkaline Phosphatase: 75 U/L (ref 26–84)
Anion gap: 5 (ref 5–15)
BUN: 15 mg/dL (ref 7–22)
CO2: 31 mmol/L (ref 18–33)
Calcium: 9 mg/dL (ref 8.0–10.3)
Chloride: 100 mmol/L (ref 98–108)
Creatinine: 0.9 mg/dL (ref 0.60–1.20)
Glucose, Bld: 148 mg/dL — ABNORMAL HIGH (ref 73–118)
Potassium: 3.3 mmol/L (ref 3.3–4.7)
Sodium: 136 mmol/L (ref 128–145)
Total Bilirubin: 0.8 mg/dL (ref 0.2–1.6)
Total Protein: 6.4 g/dL (ref 6.4–8.1)

## 2017-05-22 LAB — CBC WITH DIFFERENTIAL (CANCER CENTER ONLY)
Basophils Absolute: 0 10*3/uL (ref 0.0–0.1)
Basophils Relative: 0 %
Eosinophils Absolute: 0 10*3/uL (ref 0.0–0.5)
Eosinophils Relative: 0 %
HCT: 35.7 % — ABNORMAL LOW (ref 38.7–49.9)
Hemoglobin: 11.4 g/dL — ABNORMAL LOW (ref 13.0–17.1)
Lymphocytes Relative: 25 %
Lymphs Abs: 1.3 10*3/uL (ref 0.9–3.3)
MCH: 29 pg (ref 28.0–33.4)
MCHC: 31.9 g/dL — ABNORMAL LOW (ref 32.0–35.9)
MCV: 90.8 fL (ref 82.0–98.0)
Monocytes Absolute: 0.7 10*3/uL (ref 0.1–0.9)
Monocytes Relative: 13 %
Neutro Abs: 3.2 10*3/uL (ref 1.5–6.5)
Neutrophils Relative %: 62 %
Platelet Count: 168 10*3/uL (ref 145–400)
RBC: 3.93 MIL/uL — ABNORMAL LOW (ref 4.20–5.70)
RDW: 13.5 % (ref 11.1–15.7)
WBC Count: 5.2 10*3/uL (ref 4.0–10.0)

## 2017-05-22 LAB — IRON AND TIBC
Iron: 40 ug/dL — ABNORMAL LOW (ref 42–163)
Saturation Ratios: 18 % — ABNORMAL LOW (ref 42–163)
TIBC: 221 ug/dL (ref 202–409)
UIBC: 181 ug/dL

## 2017-05-22 LAB — TSH: TSH: 0.904 u[IU]/mL (ref 0.320–4.118)

## 2017-05-22 LAB — FERRITIN: Ferritin: 37 ng/mL (ref 22–316)

## 2017-05-22 MED ORDER — SODIUM CHLORIDE 0.9 % IV SOLN
Freq: Once | INTRAVENOUS | Status: AC
  Administered 2017-05-22: 12:00:00 via INTRAVENOUS

## 2017-05-22 MED ORDER — SODIUM CHLORIDE 0.9% FLUSH
10.0000 mL | INTRAVENOUS | Status: DC | PRN
  Administered 2017-05-22: 10 mL
  Filled 2017-05-22: qty 10

## 2017-05-22 MED ORDER — HEPARIN SOD (PORK) LOCK FLUSH 100 UNIT/ML IV SOLN
500.0000 [IU] | Freq: Once | INTRAVENOUS | Status: AC | PRN
  Administered 2017-05-22: 500 [IU]
  Filled 2017-05-22: qty 5

## 2017-05-22 MED ORDER — SODIUM CHLORIDE 0.9 % IV SOLN
240.0000 mg | Freq: Once | INTRAVENOUS | Status: AC
  Administered 2017-05-22: 240 mg via INTRAVENOUS
  Filled 2017-05-22: qty 24

## 2017-05-22 NOTE — Patient Instructions (Signed)
Nivolumab injection What is this medicine? NIVOLUMAB (nye VOL ue mab) is a monoclonal antibody. It is used to treat melanoma, lung cancer, kidney cancer, head and neck cancer, Hodgkin lymphoma, urothelial cancer, colon cancer, and liver cancer. This medicine may be used for other purposes; ask your health care provider or pharmacist if you have questions. COMMON BRAND NAME(S): Opdivo What should I tell my health care provider before I take this medicine? They need to know if you have any of these conditions: -diabetes -immune system problems -kidney disease -liver disease -lung disease -organ transplant -stomach or intestine problems -thyroid disease -an unusual or allergic reaction to nivolumab, other medicines, foods, dyes, or preservatives -pregnant or trying to get pregnant -breast-feeding How should I use this medicine? This medicine is for infusion into a vein. It is given by a health care professional in a hospital or clinic setting. A special MedGuide will be given to you before each treatment. Be sure to read this information carefully each time. Talk to your pediatrician regarding the use of this medicine in children. While this drug may be prescribed for children as young as 12 years for selected conditions, precautions do apply. Overdosage: If you think you have taken too much of this medicine contact a poison control center or emergency room at once. NOTE: This medicine is only for you. Do not share this medicine with others. What if I miss a dose? It is important not to miss your dose. Call your doctor or health care professional if you are unable to keep an appointment. What may interact with this medicine? Interactions have not been studied. Give your health care provider a list of all the medicines, herbs, non-prescription drugs, or dietary supplements you use. Also tell them if you smoke, drink alcohol, or use illegal drugs. Some items may interact with your  medicine. This list may not describe all possible interactions. Give your health care provider a list of all the medicines, herbs, non-prescription drugs, or dietary supplements you use. Also tell them if you smoke, drink alcohol, or use illegal drugs. Some items may interact with your medicine. What should I watch for while using this medicine? This drug may make you feel generally unwell. Continue your course of treatment even though you feel ill unless your doctor tells you to stop. You may need blood work done while you are taking this medicine. Do not become pregnant while taking this medicine or for 5 months after stopping it. Women should inform their doctor if they wish to become pregnant or think they might be pregnant. There is a potential for serious side effects to an unborn child. Talk to your health care professional or pharmacist for more information. Do not breast-feed an infant while taking this medicine. What side effects may I notice from receiving this medicine? Side effects that you should report to your doctor or health care professional as soon as possible: -allergic reactions like skin rash, itching or hives, swelling of the face, lips, or tongue -black, tarry stools -blood in the urine -bloody or watery diarrhea -changes in vision -change in sex drive -changes in emotions or moods -chest pain -confusion -cough -decreased appetite -diarrhea -facial flushing -feeling faint or lightheaded -fever, chills -hair loss -hallucination, loss of contact with reality -headache -irritable -joint pain -loss of memory -muscle pain -muscle weakness -seizures -shortness of breath -signs and symptoms of high blood sugar such as dizziness; dry mouth; dry skin; fruity breath; nausea; stomach pain; increased hunger or thirst; increased  urination -signs and symptoms of kidney injury like trouble passing urine or change in the amount of urine -signs and symptoms of liver injury  like dark yellow or brown urine; general ill feeling or flu-like symptoms; light-colored stools; loss of appetite; nausea; right upper belly pain; unusually weak or tired; yellowing of the eyes or skin -stiff neck -swelling of the ankles, feet, hands -weight gain Side effects that usually do not require medical attention (report to your doctor or health care professional if they continue or are bothersome): -bone pain -constipation -tiredness -vomiting This list may not describe all possible side effects. Call your doctor for medical advice about side effects. You may report side effects to FDA at 1-800-FDA-1088. Where should I keep my medicine? This drug is given in a hospital or clinic and will not be stored at home. NOTE: This sheet is a summary. It may not cover all possible information. If you have questions about this medicine, talk to your doctor, pharmacist, or health care provider.  2018 Elsevier/Gold Standard (2016-01-01 17:49:34)

## 2017-05-22 NOTE — Progress Notes (Signed)
Hematology and Oncology Follow Up Visit  [name] [id number] Jan 09, 1946 72 y.o. 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 He had fatigue, chills, nausea without vomiting, sinus congestion with cough and occasional SOB with the cough and also secondary to COPD.  He has a drain inserted in the seroma in the left groin. This is slowly putting out less fluid. He had 50 ccs total of fluid out yesterday.  He bruises easily on Plavix. He has had no fever, rash, skin lesions, dizziness, chest pain, palpitations, abdominal pain or changes in bowel or bladder habits.  He has swelling in his feet and ankles after being on his feet for an extended period of time. This improves with elevating his feet. No numbness or tingling in his extremities at this time.  He has maintained a good appetite and is staying well hydrated. Weight is stable.   ECOG Performance Status: 1 - Symptomatic but completely ambulatory  Medications:  Allergies as of 05/22/2017      Reactions   Cefepime Hives, Shortness Of Breath   Morphine And Related Shortness Of Breath, Nausea And Vomiting, Swelling, Other (See Comments)   Agitation, tolerates dilaudid Other reaction(s): Other (See Comments) Agitation, tolerates dilaudid   Penicillins Hives, Shortness Of Breath, Rash   Has patient had a PCN reaction causing immediate rash, facial/tongue/throat swelling, SOB or lightheadedness with hypotension: Yes Has patient had a PCN reaction causing severe rash involving mucus membranes or skin necrosis: Yes Has patient had a PCN reaction  that required hospitalization No Has patient had a PCN reaction occurring within the last 10 years: No If all of the above answers are "NO", then may proceed with Cephalosporin use.   Morphine    Oxycodone-acetaminophen       Medication List        Accurate as of 05/22/17 11:02 AM. Always use your most recent med list.          albuterol 108 (90 Base) MCG/ACT inhaler Commonly known as:  PROVENTIL HFA;VENTOLIN HFA Inhale 1-2 puffs into the lungs every 4 (four) hours as needed for wheezing or shortness of breath.   alfuzosin 10 MG 24 hr tablet Commonly known as:  UROXATRAL Take 10 mg by mouth at bedtime.   ALPRAZolam 0.5 MG tablet Commonly known as:  XANAX Take 1 tablet (0.5 mg total) by mouth 2 (two) times daily as needed. for anxiety   CIALIS 5 MG tablet Generic drug:  tadalafil Take 5 mg by mouth as needed.   clopidogrel 75 MG tablet Commonly known as:  PLAVIX TAKE 1 TABLET BY MOUTH EVERY DAY WITH BREAKFAST   COLCRYS 0.6 MG tablet Generic drug:  colchicine Take 1 tablet by mouth every evening.   FISH OIL OMEGA-3 1000 MG Caps Take 1,000 mg by mouth daily.   FLUoxetine 40 MG capsule Commonly known as:  PROZAC Take 1 capsule (40 mg total) by mouth 2 (two) times daily.   fluticasone 50 MCG/ACT nasal spray Commonly known as:  FLONASE INSTILL 2 SPRAYS IN EACH NOSTRIL EVERY DAY   gabapentin 300 MG capsule Commonly  known as:  NEURONTIN Take 1 capsule (300 mg total) by mouth 2 (two) times daily.   HYDROmorphone 2 MG tablet Commonly known as:  DILAUDID Take 1 tablet (2 mg total) by mouth every 8 (eight) hours as needed for severe pain.   linaclotide 290 MCG Caps capsule Commonly known as:  LINZESS Take 1 capsule (290 mcg total) by mouth daily before breakfast.   losartan-hydrochlorothiazide 50-12.5 MG tablet Commonly known as:  HYZAAR TAKE 1 TABLET BY MOUTH DAILY   Melatonin 5 MG Tabs Take 5 mg by mouth at bedtime.   metoprolol tartrate 25 MG  tablet Commonly known as:  LOPRESSOR Take 1 tablet (25 mg total) by mouth 2 (two) times daily.   ondansetron 8 MG tablet Commonly known as:  ZOFRAN Take 1 tablet (8 mg total) by mouth every 8 (eight) hours as needed for nausea or vomiting.   pantoprazole 40 MG tablet Commonly known as:  PROTONIX Take 1 tablet (40 mg total) by mouth every evening.   pravastatin 20 MG tablet Commonly known as:  PRAVACHOL Take 1 tablet (20 mg total) by mouth daily.   predniSONE 5 MG tablet Commonly known as:  DELTASONE Take 5 mg by mouth daily with breakfast.   prochlorperazine 10 MG tablet Commonly known as:  COMPAZINE TAKE 1 TABLET(10 MG) BY MOUTH EVERY 6 HOURS AS NEEDED FOR NAUSEA OR VOMITING   traZODone 100 MG tablet Commonly known as:  DESYREL Take 1 tablet (100 mg total) by mouth at bedtime as needed for sleep.   vitamin B-12 1000 MCG tablet Commonly known as:  CYANOCOBALAMIN Take 1,000 mcg by mouth daily.   Vitamin D3 2000 units capsule Take 1 capsule (2,000 Units total) by mouth daily.       Allergies:  Allergies  Allergen Reactions  . Cefepime Hives and Shortness Of Breath  . Morphine And Related Shortness Of Breath, Nausea And Vomiting, Swelling and Other (See Comments)    Agitation, tolerates dilaudid Other reaction(s): Other (See Comments) Agitation, tolerates dilaudid  . Penicillins Hives, Shortness Of Breath and Rash    Has patient had a PCN reaction causing immediate rash, facial/tongue/throat swelling, SOB or lightheadedness with hypotension: Yes Has patient had a PCN reaction causing severe rash involving mucus membranes or skin necrosis: Yes Has patient had a PCN reaction that required hospitalization No Has patient had a PCN reaction occurring within the last 10 years: No If all of the above answers are "NO", then may proceed with Cephalosporin use.   [name] Morphine   . Oxycodone-Acetaminophen     Past Medical History, Surgical history, Social history, and Family  History were reviewed and updated.  Review of Systems: All other 10 point review of systems is negative.   Physical Exam:  vitals were not taken for this visit.   Wt Readings from Last 3 Encounters:  05/22/17 146 lb (66.2 kg)  05/08/17 147 lb (66.7 kg)  05/08/17 147 lb (66.7 kg)    Ocular: Sclerae unicteric, pupils equal, round and reactive to light Ear-nose-throat: Oropharynx clear, dentition fair Lymphatic: No cervical, supraclavicular or axillary adenopathy Lungs no rales or rhonchi, good excursion bilaterally Heart regular rate and rhythm, no murmur appreciated Abd soft, nontender, positive bowel sounds, no liver or spleen tip palpated on exam, No fluid wave  MSK no focal spinal tenderness, no joint edema Neuro: non-focal, well-oriented, appropriate affect Breasts: Deferred   Lab Results  Component Value Date   WBC 7.1 05/08/2017   HGB 10.5 (L) 04/10/2017  HCT 32.1 (L) 05/08/2017   MCV 90.7 05/08/2017   PLT 282 05/08/2017   Lab Results  Component Value Date   FERRITIN 42 05/08/2017   IRON 24 (L) 05/08/2017   TIBC 210 05/08/2017   UIBC 186 05/08/2017   IRONPCTSAT 11 (L) 05/08/2017   Lab Results  Component Value Date   RETICCTPCT 1.9 03/24/2013   RBC 3.54 (L) 05/08/2017   No results found for: KPAFRELGTCHN, LAMBDASER, KAPLAMBRATIO No results found for: Kandis Cocking, IGMSERUM No results found for: Odetta Pink, SPEI   Chemistry      Component Value Date/Time   NA 133 05/08/2017 0950   NA 140 04/10/2017 1046   NA 135 (L) 03/06/2017 1104   K 3.8 05/08/2017 0950   K 3.7 04/10/2017 1046   K 4.2 03/06/2017 1104   CL 98 05/08/2017 0950   CL 98 04/10/2017 1046   CO2 29 05/08/2017 0950   CO2 31 04/10/2017 1046   CO2 30 (H) 03/06/2017 1104   BUN 18 05/08/2017 0950   BUN 21 04/10/2017 1046   BUN 20.0 03/06/2017 1104   CREATININE 0.80 05/08/2017 0950   CREATININE 0.9 04/10/2017 1046   CREATININE 1.0  03/06/2017 1104      Component Value Date/Time   CALCIUM 8.5 05/08/2017 0950   CALCIUM 8.9 04/10/2017 1046   CALCIUM 9.4 03/06/2017 1104   ALKPHOS 74 05/08/2017 0950   ALKPHOS 65 04/10/2017 1046   ALKPHOS 78 03/06/2017 1104   AST 19 05/08/2017 0950   AST 20 03/06/2017 1104   ALT 21 05/08/2017 0950   ALT 21 04/10/2017 1046   ALT 20 03/06/2017 1104   BILITOT 0.7 05/08/2017 0950   BILITOT 0.47 03/06/2017 1104       Impression and Plan: Mr. Bowens is a pleasant 72 yo caucasian gentleman with history of hemochromatosis, homozygous for C282Y mutation. He was diagnosed with stage IIIc (T2N2M0) melanoma of the left inner thigh.  He is doing well with treatment so far. He is getting over a sinus infection and starting to feel a little better.  We will proceed with treatment today as planned.  He has his treatment and appointment schedule.  He will contact our office with any questions or concerns. We can certainly see him sooner if need be.   Laverna Peace, NP 2/14/201911:02 AM

## 2017-06-05 ENCOUNTER — Inpatient Hospital Stay: Payer: Medicare Other

## 2017-06-05 ENCOUNTER — Inpatient Hospital Stay (HOSPITAL_BASED_OUTPATIENT_CLINIC_OR_DEPARTMENT_OTHER): Payer: Medicare Other | Admitting: Family

## 2017-06-05 DIAGNOSIS — J449 Chronic obstructive pulmonary disease, unspecified: Secondary | ICD-10-CM | POA: Diagnosis not present

## 2017-06-05 DIAGNOSIS — C4372 Malignant melanoma of left lower limb, including hip: Secondary | ICD-10-CM | POA: Diagnosis not present

## 2017-06-05 DIAGNOSIS — D500 Iron deficiency anemia secondary to blood loss (chronic): Secondary | ICD-10-CM

## 2017-06-05 DIAGNOSIS — Z5112 Encounter for antineoplastic immunotherapy: Secondary | ICD-10-CM | POA: Diagnosis not present

## 2017-06-05 DIAGNOSIS — E032 Hypothyroidism due to medicaments and other exogenous substances: Secondary | ICD-10-CM

## 2017-06-05 DIAGNOSIS — Z79899 Other long term (current) drug therapy: Secondary | ICD-10-CM | POA: Diagnosis not present

## 2017-06-05 LAB — CMP (CANCER CENTER ONLY)
ALT: 12 U/L (ref 10–47)
AST: 17 U/L (ref 11–38)
Albumin: 3.3 g/dL — ABNORMAL LOW (ref 3.5–5.0)
Alkaline Phosphatase: 65 U/L (ref 26–84)
Anion gap: 7 (ref 5–15)
BUN: 14 mg/dL (ref 7–22)
CO2: 32 mmol/L (ref 18–33)
Calcium: 9 mg/dL (ref 8.0–10.3)
Chloride: 93 mmol/L — ABNORMAL LOW (ref 98–108)
Creatinine: 0.8 mg/dL (ref 0.60–1.20)
Glucose, Bld: 101 mg/dL (ref 73–118)
Potassium: 3.4 mmol/L (ref 3.3–4.7)
Sodium: 132 mmol/L (ref 128–145)
Total Bilirubin: 0.7 mg/dL (ref 0.2–1.6)
Total Protein: 5.8 g/dL — ABNORMAL LOW (ref 6.4–8.1)

## 2017-06-05 LAB — IRON AND TIBC
Iron: 23 ug/dL — ABNORMAL LOW (ref 42–163)
Saturation Ratios: 12 % — ABNORMAL LOW (ref 42–163)
TIBC: 191 ug/dL — ABNORMAL LOW (ref 202–409)
UIBC: 168 ug/dL

## 2017-06-05 LAB — CBC WITH DIFFERENTIAL (CANCER CENTER ONLY)
Basophils Absolute: 0 10*3/uL (ref 0.0–0.1)
Basophils Relative: 0 %
Eosinophils Absolute: 0 10*3/uL (ref 0.0–0.5)
Eosinophils Relative: 0 %
HCT: 31.4 % — ABNORMAL LOW (ref 38.7–49.9)
Hemoglobin: 10.3 g/dL — ABNORMAL LOW (ref 13.0–17.1)
Lymphocytes Relative: 15 %
Lymphs Abs: 0.9 10*3/uL (ref 0.9–3.3)
MCH: 29.2 pg (ref 28.0–33.4)
MCHC: 32.8 g/dL (ref 32.0–35.9)
MCV: 89 fL (ref 82.0–98.0)
Monocytes Absolute: 0.7 10*3/uL (ref 0.1–0.9)
Monocytes Relative: 12 %
Neutro Abs: 4.1 10*3/uL (ref 1.5–6.5)
Neutrophils Relative %: 73 %
Platelet Count: 249 10*3/uL (ref 145–400)
RBC: 3.53 MIL/uL — ABNORMAL LOW (ref 4.20–5.70)
RDW: 13.1 % (ref 11.1–15.7)
WBC Count: 5.7 10*3/uL (ref 4.0–10.0)

## 2017-06-05 LAB — TSH: TSH: 1.083 u[IU]/mL (ref 0.320–4.118)

## 2017-06-05 LAB — FERRITIN: Ferritin: 29 ng/mL (ref 22–316)

## 2017-06-05 MED ORDER — HEPARIN SOD (PORK) LOCK FLUSH 100 UNIT/ML IV SOLN
500.0000 [IU] | Freq: Once | INTRAVENOUS | Status: AC | PRN
  Administered 2017-06-05: 500 [IU]
  Filled 2017-06-05: qty 5

## 2017-06-05 MED ORDER — SODIUM CHLORIDE 0.9% FLUSH
10.0000 mL | INTRAVENOUS | Status: DC | PRN
  Administered 2017-06-05: 10 mL
  Filled 2017-06-05: qty 10

## 2017-06-05 MED ORDER — SODIUM CHLORIDE 0.9 % IV SOLN
Freq: Once | INTRAVENOUS | Status: AC
  Administered 2017-06-05: 12:00:00 via INTRAVENOUS

## 2017-06-05 MED ORDER — SODIUM CHLORIDE 0.9 % IV SOLN
240.0000 mg | Freq: Once | INTRAVENOUS | Status: AC
  Administered 2017-06-05: 240 mg via INTRAVENOUS
  Filled 2017-06-05: qty 24

## 2017-06-05 NOTE — Patient Instructions (Signed)
[facility] Discharge Instructions for Patients Receiving Chemotherapy  Today you received the following chemotherapy agents Nivolumab To help prevent nausea and vomiting after your treatment, we encourage you to take your nausea medication as prescribed.   If you develop nausea and vomiting that is not controlled by your nausea medication, call the clinic.   BELOW ARE SYMPTOMS THAT SHOULD BE REPORTED IMMEDIATELY:  *FEVER GREATER THAN 100.5 F  *CHILLS WITH OR WITHOUT FEVER  NAUSEA AND VOMITING THAT IS NOT CONTROLLED WITH YOUR NAUSEA MEDICATION  *UNUSUAL SHORTNESS OF BREATH  *UNUSUAL BRUISING OR BLEEDING  TENDERNESS IN MOUTH AND THROAT WITH OR WITHOUT PRESENCE OF ULCERS  *URINARY PROBLEMS  *BOWEL PROBLEMS  UNUSUAL RASH Items with * indicate a potential emergency and should be followed up as soon as possible.  Feel free to call the clinic should you have any questions or concerns. The clinic phone number is [phone].  Please show the [facility] at check-in to the Emergency Department and triage nurse.

## 2017-06-05 NOTE — Progress Notes (Signed)
Hematology and Oncology Follow Up Visit  [name] [id number] 1946-02-17 72 y.o. 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 He is doing well but has had some fatigue. He has had an occasional headache that resolves with Tylenol.  No fever, chills, n/v, cough, rash, dizziness, chest pain, palpitations, abdominal pain or changes in bowel or bladder habits.  SOB with over exertion is unchanged.  His appetite is down and plans to start taking his Marinol regularly. He is trying to hydrate at home but admits that he needs to drink more water. His weight is up 6 lbs since his last visit.  No tenderness, numbness or tingling in his extremities. He has swelling in his legs that waxes and wanes. He has Hyzaar which he takes daily. His chloride was down at 93 so he will increase his fluid intake. His drain in the left groin seroma is intact and putting out around 50 ml of serosanguinous fluid daily.   No episodes of bleeding. He bruises easily on Plavix.  No lymphadenopathy noted on exam.   ECOG Performance Status: 1 - Symptomatic but completely ambulatory  Medications:  Allergies as of 06/05/2017      Reactions   Cefepime Hives, Shortness Of Breath   Morphine And Related Shortness Of Breath, Nausea And Vomiting, Swelling, Other (See Comments)   Agitation, tolerates dilaudid Other reaction(s): Other (See Comments) Agitation, tolerates dilaudid   Penicillins Hives, Shortness Of Breath, Rash   Has patient had a PCN reaction causing immediate rash, facial/tongue/throat swelling, SOB or lightheadedness with hypotension: Yes Has  patient had a PCN reaction causing severe rash involving mucus membranes or skin necrosis: Yes Has patient had a PCN reaction that required hospitalization No Has patient had a PCN reaction occurring within the last 10 years: No If all of the above answers are "NO", then may proceed with Cephalosporin use.   Morphine    Oxycodone-acetaminophen       Medication List        Accurate as of 06/05/17 11:42 AM. Always use your most recent med list.          albuterol 108 (90 Base) MCG/ACT inhaler Commonly known as:  PROVENTIL HFA;VENTOLIN HFA Inhale 1-2 puffs into the lungs every 4 (four) hours as needed for wheezing or shortness of breath.   alfuzosin 10 MG 24 hr tablet Commonly known as:  UROXATRAL Take 10 mg by mouth at bedtime.   ALPRAZolam 0.5 MG tablet Commonly known as:  XANAX Take 1 tablet (0.5 mg total) by mouth 2 (two) times daily as needed. for anxiety   CIALIS 5 MG tablet Generic drug:  tadalafil Take 5 mg by mouth as needed.   clopidogrel 75 MG tablet Commonly known as:  PLAVIX TAKE 1 TABLET BY MOUTH EVERY DAY WITH BREAKFAST   COLCRYS 0.6 MG tablet Generic drug:  colchicine Take 1 tablet by mouth every evening.   FISH OIL OMEGA-3 1000 MG Caps Take 1,000 mg by mouth daily.   FLUoxetine 40 MG capsule Commonly known as:  PROZAC Take 1 capsule (40 mg total) by mouth 2 (two) times daily.   fluticasone 50  MCG/ACT nasal spray Commonly known as:  FLONASE INSTILL 2 SPRAYS IN EACH NOSTRIL EVERY DAY   gabapentin 300 MG capsule Commonly known as:  NEURONTIN Take 1 capsule (300 mg total) by mouth 2 (two) times daily.   HYDROmorphone 2 MG tablet Commonly known as:  DILAUDID Take 1 tablet (2 mg total) by mouth every 8 (eight) hours as needed for severe pain.   losartan-hydrochlorothiazide 50-12.5 MG tablet Commonly known as:  HYZAAR TAKE 1 TABLET BY MOUTH DAILY   Melatonin 5 MG Tabs Take 5 mg by mouth at bedtime. Takes 10 mg at HS   metoprolol tartrate 25 MG  tablet Commonly known as:  LOPRESSOR Take 1 tablet (25 mg total) by mouth 2 (two) times daily.   ondansetron 8 MG tablet Commonly known as:  ZOFRAN Take 1 tablet (8 mg total) by mouth every 8 (eight) hours as needed for nausea or vomiting.   pantoprazole 40 MG tablet Commonly known as:  PROTONIX Take 1 tablet (40 mg total) by mouth every evening.   pravastatin 20 MG tablet Commonly known as:  PRAVACHOL Take 1 tablet (20 mg total) by mouth daily.   predniSONE 5 MG tablet Commonly known as:  DELTASONE Take 5 mg by mouth daily with breakfast.   prochlorperazine 10 MG tablet Commonly known as:  COMPAZINE TAKE 1 TABLET(10 MG) BY MOUTH EVERY 6 HOURS AS NEEDED FOR NAUSEA OR VOMITING   traZODone 100 MG tablet Commonly known as:  DESYREL Take 1 tablet (100 mg total) by mouth at bedtime as needed for sleep.   vitamin B-12 1000 MCG tablet Commonly known as:  CYANOCOBALAMIN Take 1,000 mcg by mouth daily.   Vitamin D3 2000 units capsule Take 1 capsule (2,000 Units total) by mouth daily.       Allergies:  Allergies  Allergen Reactions  . Cefepime Hives and Shortness Of Breath  . Morphine And Related Shortness Of Breath, Nausea And Vomiting, Swelling and Other (See Comments)    Agitation, tolerates dilaudid Other reaction(s): Other (See Comments) Agitation, tolerates dilaudid  . Penicillins Hives, Shortness Of Breath and Rash    Has patient had a PCN reaction causing immediate rash, facial/tongue/throat swelling, SOB or lightheadedness with hypotension: Yes Has patient had a PCN reaction causing severe rash involving mucus membranes or skin necrosis: Yes Has patient had a PCN reaction that required hospitalization No Has patient had a PCN reaction occurring within the last 10 years: No If all of the above answers are "NO", then may proceed with Cephalosporin use.   [name] Morphine   . Oxycodone-Acetaminophen     Past Medical History, Surgical history, Social history, and Family  History were reviewed and updated.  Review of Systems: All other 10 point review of systems is negative.   Physical Exam:  vitals were not taken for this visit.   Wt Readings from Last 3 Encounters:  06/05/17 152 lb (68.9 kg)  05/22/17 146 lb (66.2 kg)  05/08/17 147 lb (66.7 kg)    Ocular: Sclerae unicteric, pupils equal, round and reactive to light Ear-nose-throat: Oropharynx clear, dentition fair Lymphatic: No cervical, supraclavicular or axillary adenopathy Lungs no rales or rhonchi, good excursion bilaterally Heart regular rate and rhythm, no murmur appreciated Abd soft, nontender, positive bowel sounds, no liver or spleen tip palpated on exam, no fluid wave  MSK no focal spinal tenderness, no joint edema Neuro: non-focal, well-oriented, appropriate affect Breasts: Deferred   Lab Results  Component Value Date   WBC 5.7 06/05/2017  HGB 10.5 (L) 04/10/2017   HCT 31.4 (L) 06/05/2017   MCV 89.0 06/05/2017   PLT 249 06/05/2017   Lab Results  Component Value Date   FERRITIN 37 05/22/2017   IRON 40 (L) 05/22/2017   TIBC 221 05/22/2017   UIBC 181 05/22/2017   IRONPCTSAT 18 (L) 05/22/2017   Lab Results  Component Value Date   RETICCTPCT 1.9 03/24/2013   RBC 3.53 (L) 06/05/2017   No results found for: KPAFRELGTCHN, LAMBDASER, KAPLAMBRATIO No results found for: Kandis Cocking, IGMSERUM No results found for: Odetta Pink, SPEI   Chemistry      Component Value Date/Time   NA 132 06/05/2017 1045   NA 140 04/10/2017 1046   NA 135 (L) 03/06/2017 1104   K 3.4 06/05/2017 1045   K 3.7 04/10/2017 1046   K 4.2 03/06/2017 1104   CL 93 (L) 06/05/2017 1045   CL 98 04/10/2017 1046   CO2 32 06/05/2017 1045   CO2 31 04/10/2017 1046   CO2 30 (H) 03/06/2017 1104   BUN 14 06/05/2017 1045   BUN 21 04/10/2017 1046   BUN 20.0 03/06/2017 1104   CREATININE 0.80 06/05/2017 1045   CREATININE 0.9 04/10/2017 1046   CREATININE 1.0  03/06/2017 1104      Component Value Date/Time   CALCIUM 9.0 06/05/2017 1045   CALCIUM 8.9 04/10/2017 1046   CALCIUM 9.4 03/06/2017 1104   ALKPHOS 65 06/05/2017 1045   ALKPHOS 65 04/10/2017 1046   ALKPHOS 78 03/06/2017 1104   AST 17 06/05/2017 1045   AST 20 03/06/2017 1104   ALT 12 06/05/2017 1045   ALT 21 04/10/2017 1046   ALT 20 03/06/2017 1104   BILITOT 0.7 06/05/2017 1045   BILITOT 0.47 03/06/2017 1104      Impression and Plan: Mr. Conroy is a very pleasant 71 yo caucasian gentleman with history of hemochromatosis, homozygous for C282Y mutation.  He has now been diagnosed with stage IIIc (T2N2M0) melanoma or the left thigh. This was excised and is doing well with Opdivo.  We will proceed with cycle 5 today as planned. Iron studies are pending.  We will plan to see him back on March 14th for follow-up, lab and treatment.  They will contact our office with any questions or concerns. We can certainly see him sooner if need be.   Laverna Peace, NP 2/28/201911:42 AM

## 2017-06-05 NOTE — Patient Instructions (Signed)
Implanted Port Home Guide An implanted port is a type of central line that is placed under the skin. Central lines are used to provide IV access when treatment or nutrition needs to be given through a person's veins. Implanted ports are used for long-term IV access. An implanted port may be placed because:  You need IV medicine that would be irritating to the small veins in your hands or arms.  You need long-term IV medicines, such as antibiotics.  You need IV nutrition for a long period.  You need frequent blood draws for lab tests.  You need dialysis.  Implanted ports are usually placed in the chest area, but they can also be placed in the upper arm, the abdomen, or the leg. An implanted port has two main parts:  Reservoir. The reservoir is round and will appear as a small, raised area under your skin. The reservoir is the part where a needle is inserted to give medicines or draw blood.  Catheter. The catheter is a thin, flexible tube that extends from the reservoir. The catheter is placed into a large vein. Medicine that is inserted into the reservoir goes into the catheter and then into the vein.  How will I care for my incision site? Do not get the incision site wet. Bathe or shower as directed by your health care provider. How is my port accessed? Special steps must be taken to access the port:  Before the port is accessed, a numbing cream can be placed on the skin. This helps numb the skin over the port site.  Your health care provider uses a sterile technique to access the port. ? Your health care provider must put on a mask and sterile gloves. ? The skin over your port is cleaned carefully with an antiseptic and allowed to dry. ? The port is gently pinched between sterile gloves, and a needle is inserted into the port.  Only "non-coring" port needles should be used to access the port. Once the port is accessed, a blood return should be checked. This helps ensure that the port  is in the vein and is not clogged.  If your port needs to remain accessed for a constant infusion, a clear (transparent) bandage will be placed over the needle site. The bandage and needle will need to be changed every week, or as directed by your health care provider.  Keep the bandage covering the needle clean and dry. Do not get it wet. Follow your health care provider's instructions on how to take a shower or bath while the port is accessed.  If your port does not need to stay accessed, no bandage is needed over the port.  What is flushing? Flushing helps keep the port from getting clogged. Follow your health care provider's instructions on how and when to flush the port. Ports are usually flushed with saline solution or a medicine called heparin. The need for flushing will depend on how the port is used.  If the port is used for intermittent medicines or blood draws, the port will need to be flushed: ? After medicines have been given. ? After blood has been drawn. ? As part of routine maintenance.  If a constant infusion is running, the port may not need to be flushed.  How long will my port stay implanted? The port can stay in for as long as your health care provider thinks it is needed. When it is time for the port to come out, surgery will be   done to remove it. The procedure is similar to the one performed when the port was put in. When should I seek immediate medical care? When you have an implanted port, you should seek immediate medical care if:  You notice a bad smell coming from the incision site.  You have swelling, redness, or drainage at the incision site.  You have more swelling or pain at the port site or the surrounding area.  You have a fever that is not controlled with medicine.  This information is not intended to replace advice given to you by your health care provider. Make sure you discuss any questions you have with your health care provider. Document  Released: 03/25/2005 Document Revised: 08/31/2015 Document Reviewed: 11/30/2012 Elsevier Interactive Patient Education  2017 [employer]  

## 2017-06-11 ENCOUNTER — Telehealth: Payer: Self-pay | Admitting: *Deleted

## 2017-06-11 DIAGNOSIS — N401 Enlarged prostate with lower urinary tract symptoms: Secondary | ICD-10-CM | POA: Diagnosis not present

## 2017-06-11 NOTE — Telephone Encounter (Signed)
Patient has a history of rheumatoid arthritis and feels like his shoulder is frozen. He is trying to see if his rheumatologist can see him to give him a steroid injection. Patient wants to make sure this is okay while he is receiving treatment.  Spoke with Dr Marin Olp. He is fine with patient receiving this treatment per his rheumatologist.  Patient aware of Dr Antonieta Pert okay.

## 2017-06-12 DIAGNOSIS — M5136 Other intervertebral disc degeneration, lumbar region: Secondary | ICD-10-CM | POA: Diagnosis not present

## 2017-06-12 DIAGNOSIS — M1120 Other chondrocalcinosis, unspecified site: Secondary | ICD-10-CM | POA: Diagnosis not present

## 2017-06-12 DIAGNOSIS — M0579 Rheumatoid arthritis with rheumatoid factor of multiple sites without organ or systems involvement: Secondary | ICD-10-CM | POA: Diagnosis not present

## 2017-06-12 DIAGNOSIS — E663 Overweight: Secondary | ICD-10-CM | POA: Diagnosis not present

## 2017-06-12 DIAGNOSIS — D6489 Other specified anemias: Secondary | ICD-10-CM | POA: Diagnosis not present

## 2017-06-12 DIAGNOSIS — R0600 Dyspnea, unspecified: Secondary | ICD-10-CM | POA: Diagnosis not present

## 2017-06-12 DIAGNOSIS — C4372 Malignant melanoma of left lower limb, including hip: Secondary | ICD-10-CM | POA: Diagnosis not present

## 2017-06-12 DIAGNOSIS — Z6825 Body mass index (BMI) 25.0-25.9, adult: Secondary | ICD-10-CM | POA: Diagnosis not present

## 2017-06-12 DIAGNOSIS — I10 Essential (primary) hypertension: Secondary | ICD-10-CM | POA: Diagnosis not present

## 2017-06-12 DIAGNOSIS — M25511 Pain in right shoulder: Secondary | ICD-10-CM | POA: Diagnosis not present

## 2017-06-12 DIAGNOSIS — M47812 Spondylosis without myelopathy or radiculopathy, cervical region: Secondary | ICD-10-CM | POA: Diagnosis not present

## 2017-06-12 DIAGNOSIS — M25531 Pain in right wrist: Secondary | ICD-10-CM | POA: Diagnosis not present

## 2017-06-12 DIAGNOSIS — M545 Low back pain: Secondary | ICD-10-CM | POA: Diagnosis not present

## 2017-06-12 DIAGNOSIS — M150 Primary generalized (osteo)arthritis: Secondary | ICD-10-CM | POA: Diagnosis not present

## 2017-06-16 DIAGNOSIS — N401 Enlarged prostate with lower urinary tract symptoms: Secondary | ICD-10-CM | POA: Diagnosis not present

## 2017-06-16 DIAGNOSIS — R972 Elevated prostate specific antigen [PSA]: Secondary | ICD-10-CM | POA: Diagnosis not present

## 2017-06-16 DIAGNOSIS — N5201 Erectile dysfunction due to arterial insufficiency: Secondary | ICD-10-CM | POA: Diagnosis not present

## 2017-06-16 DIAGNOSIS — R351 Nocturia: Secondary | ICD-10-CM | POA: Diagnosis not present

## 2017-06-19 ENCOUNTER — Inpatient Hospital Stay: Payer: Medicare Other

## 2017-06-19 ENCOUNTER — Inpatient Hospital Stay: Payer: Medicare Other | Attending: Hematology & Oncology | Admitting: Family

## 2017-06-19 VITALS — BP 132/70 | HR 58 | Temp 97.7°F | Resp 18 | Wt 145.2 lb

## 2017-06-19 DIAGNOSIS — Z5112 Encounter for antineoplastic immunotherapy: Secondary | ICD-10-CM | POA: Insufficient documentation

## 2017-06-19 DIAGNOSIS — Z79899 Other long term (current) drug therapy: Secondary | ICD-10-CM | POA: Insufficient documentation

## 2017-06-19 DIAGNOSIS — E032 Hypothyroidism due to medicaments and other exogenous substances: Secondary | ICD-10-CM

## 2017-06-19 DIAGNOSIS — C4372 Malignant melanoma of left lower limb, including hip: Secondary | ICD-10-CM

## 2017-06-19 DIAGNOSIS — D500 Iron deficiency anemia secondary to blood loss (chronic): Secondary | ICD-10-CM

## 2017-06-19 LAB — CMP (CANCER CENTER ONLY)
ALT: 15 U/L (ref 10–47)
AST: 16 U/L (ref 11–38)
Albumin: 3.4 g/dL — ABNORMAL LOW (ref 3.5–5.0)
Alkaline Phosphatase: 69 U/L (ref 26–84)
Anion gap: 11 (ref 5–15)
BUN: 15 mg/dL (ref 7–22)
CO2: 31 mmol/L (ref 18–33)
Calcium: 9.1 mg/dL (ref 8.0–10.3)
Chloride: 96 mmol/L — ABNORMAL LOW (ref 98–108)
Creatinine: 0.9 mg/dL (ref 0.60–1.20)
Glucose, Bld: 129 mg/dL — ABNORMAL HIGH (ref 73–118)
Potassium: 4.2 mmol/L (ref 3.3–4.7)
Sodium: 138 mmol/L (ref 128–145)
Total Bilirubin: 0.6 mg/dL (ref 0.2–1.6)
Total Protein: 6.3 g/dL — ABNORMAL LOW (ref 6.4–8.1)

## 2017-06-19 LAB — CBC WITH DIFFERENTIAL (CANCER CENTER ONLY)
Basophils Absolute: 0 10*3/uL (ref 0.0–0.1)
Basophils Relative: 0 %
Eosinophils Absolute: 0 10*3/uL (ref 0.0–0.5)
Eosinophils Relative: 0 %
HCT: 33.7 % — ABNORMAL LOW (ref 38.7–49.9)
Hemoglobin: 10.8 g/dL — ABNORMAL LOW (ref 13.0–17.1)
Lymphocytes Relative: 16 %
Lymphs Abs: 1 10*3/uL (ref 0.9–3.3)
MCH: 28.6 pg (ref 28.0–33.4)
MCHC: 32 g/dL (ref 32.0–35.9)
MCV: 89.4 fL (ref 82.0–98.0)
Monocytes Absolute: 0.6 10*3/uL (ref 0.1–0.9)
Monocytes Relative: 9 %
Neutro Abs: 5 10*3/uL (ref 1.5–6.5)
Neutrophils Relative %: 75 %
Platelet Count: 261 10*3/uL (ref 145–400)
RBC: 3.77 MIL/uL — ABNORMAL LOW (ref 4.20–5.70)
RDW: 13.1 % (ref 11.1–15.7)
WBC Count: 6.6 10*3/uL (ref 4.0–10.0)

## 2017-06-19 LAB — TSH: TSH: 0.899 u[IU]/mL (ref 0.320–4.118)

## 2017-06-19 LAB — IRON AND TIBC
Iron: 28 ug/dL — ABNORMAL LOW (ref 42–163)
Saturation Ratios: 13 % — ABNORMAL LOW (ref 42–163)
TIBC: 213 ug/dL (ref 202–409)
UIBC: 185 ug/dL

## 2017-06-19 LAB — FERRITIN: Ferritin: 24 ng/mL (ref 22–316)

## 2017-06-19 MED ORDER — SODIUM CHLORIDE 0.9 % IV SOLN
240.0000 mg | Freq: Once | INTRAVENOUS | Status: AC
  Administered 2017-06-19: 240 mg via INTRAVENOUS
  Filled 2017-06-19: qty 24

## 2017-06-19 MED ORDER — SODIUM CHLORIDE 0.9% FLUSH
10.0000 mL | INTRAVENOUS | Status: DC | PRN
  Administered 2017-06-19: 10 mL
  Filled 2017-06-19: qty 10

## 2017-06-19 MED ORDER — HEPARIN SOD (PORK) LOCK FLUSH 100 UNIT/ML IV SOLN
500.0000 [IU] | Freq: Once | INTRAVENOUS | Status: AC | PRN
  Administered 2017-06-19: 500 [IU]
  Filled 2017-06-19: qty 5

## 2017-06-19 MED ORDER — SODIUM CHLORIDE 0.9 % IV SOLN
Freq: Once | INTRAVENOUS | Status: AC
  Administered 2017-06-19: 11:00:00 via INTRAVENOUS

## 2017-06-19 NOTE — Patient Instructions (Signed)
[facility] Discharge Instructions for Patients Receiving Chemotherapy  Today you received the following chemotherapy agents Nivolumab To help prevent nausea and vomiting after your treatment, we encourage you to take your nausea medication as prescribed.    If you develop nausea and vomiting that is not controlled by your nausea medication, call the clinic.   BELOW ARE SYMPTOMS THAT SHOULD BE REPORTED IMMEDIATELY:  *FEVER GREATER THAN 100.5 F  *CHILLS WITH OR WITHOUT FEVER  NAUSEA AND VOMITING THAT IS NOT CONTROLLED WITH YOUR NAUSEA MEDICATION  *UNUSUAL SHORTNESS OF BREATH  *UNUSUAL BRUISING OR BLEEDING  TENDERNESS IN MOUTH AND THROAT WITH OR WITHOUT PRESENCE OF ULCERS  *URINARY PROBLEMS  *BOWEL PROBLEMS  UNUSUAL RASH Items with * indicate a potential emergency and should be followed up as soon as possible.  Feel free to call the clinic should you have any questions or concerns. The clinic phone number is [phone].  Please show the [facility] at check-in to the Emergency Department and triage nurse.

## 2017-06-19 NOTE — Progress Notes (Signed)
Hematology and Oncology Follow Up Visit  [name] [id number] [birth year] 72 y.o. 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 He is doing well but has still had some fatigue.  His appetite comes and goes. He is using a little cannabis to help with his appetite. He is also drinking Ensure daily. He is hydrating better. His weight is down 7 lbs. We will continue to watch this closely. He has a friend that is cooking him soups with lots of protein and vegetables in it.  No lymphadenopathy found on exam.  No fever, chills, n/v, cough, rash, dizziness, chest pain, palpitations, abdominal pain or changes in bowel or bladder habits.  No swelling, tenderness, numbness or tingling in her extremities at this time. No c/o pain.  He had the drain removed from his left groin seroma earlier this week on Monday.  He has had no episodes of bleeding. He does bruise easily on Plavix.   ECOG Performance Status: 1 - Symptomatic but completely ambulatory  Medications:  Allergies as of 06/19/2017      Reactions   Cefepime Hives, Shortness Of Breath   Morphine And Related Shortness Of Breath, Nausea And Vomiting, Swelling, Other (See Comments)   Agitation, tolerates dilaudid Other reaction(s): Other (See Comments) Agitation, tolerates dilaudid   Penicillins Hives, Shortness Of Breath, Rash   Has patient had a PCN reaction causing immediate rash, facial/tongue/throat swelling, SOB or lightheadedness with hypotension: Yes Has patient had a PCN reaction causing severe rash involving mucus membranes or skin necrosis: Yes Has  patient had a PCN reaction that required hospitalization No Has patient had a PCN reaction occurring within the last 10 years: No If all of the above answers are "NO", then may proceed with Cephalosporin use.   Morphine    Oxycodone-acetaminophen       Medication List        Accurate as of 06/19/17 10:54 AM. Always use your most recent med list.          albuterol 108 (90 Base) MCG/ACT inhaler Commonly known as:  PROVENTIL HFA;VENTOLIN HFA Inhale 1-2 puffs into the lungs every 4 (four) hours as needed for wheezing or shortness of breath.   alfuzosin 10 MG 24 hr tablet Commonly known as:  UROXATRAL Take 10 mg by mouth at bedtime.   ALPRAZolam 0.5 MG tablet Commonly known as:  XANAX Take 1 tablet (0.5 mg total) by mouth 2 (two) times daily as needed. for anxiety   CIALIS 5 MG tablet Generic drug:  tadalafil Take 5 mg by mouth as needed.   clopidogrel 75 MG tablet Commonly known as:  PLAVIX TAKE 1 TABLET BY MOUTH EVERY DAY WITH BREAKFAST   COLCRYS 0.6 MG tablet Generic drug:  colchicine Take 1 tablet by mouth every evening.   FISH OIL OMEGA-3 1000 MG Caps Take 1,000 mg by mouth daily.   FLUoxetine 40 MG capsule Commonly known as:  PROZAC Take 1 capsule (40 mg total) by mouth 2 (two) times daily.   fluticasone 50 MCG/ACT nasal spray Commonly known as:  FLONASE INSTILL 2 SPRAYS IN EACH NOSTRIL EVERY DAY   gabapentin  300 MG capsule Commonly known as:  NEURONTIN Take 1 capsule (300 mg total) by mouth 2 (two) times daily.   HYDROmorphone 2 MG tablet Commonly known as:  DILAUDID Take 1 tablet (2 mg total) by mouth every 8 (eight) hours as needed for severe pain.   losartan-hydrochlorothiazide 50-12.5 MG tablet Commonly known as:  HYZAAR TAKE 1 TABLET BY MOUTH DAILY   Melatonin 5 MG Tabs Take 5 mg by mouth at bedtime. Takes 10 mg at HS   metoprolol tartrate 25 MG tablet Commonly known as:  LOPRESSOR Take 1 tablet (25 mg total) by mouth 2 (two) times daily.     ondansetron 8 MG tablet Commonly known as:  ZOFRAN Take 1 tablet (8 mg total) by mouth every 8 (eight) hours as needed for nausea or vomiting.   pantoprazole 40 MG tablet Commonly known as:  PROTONIX Take 1 tablet (40 mg total) by mouth every evening.   pravastatin 20 MG tablet Commonly known as:  PRAVACHOL Take 1 tablet (20 mg total) by mouth daily.   predniSONE 5 MG tablet Commonly known as:  DELTASONE Take 5 mg by mouth daily with breakfast.   prochlorperazine 10 MG tablet Commonly known as:  COMPAZINE TAKE 1 TABLET(10 MG) BY MOUTH EVERY 6 HOURS AS NEEDED FOR NAUSEA OR VOMITING   traZODone 100 MG tablet Commonly known as:  DESYREL Take 1 tablet (100 mg total) by mouth at bedtime as needed for sleep.   vitamin B-12 1000 MCG tablet Commonly known as:  CYANOCOBALAMIN Take 1,000 mcg by mouth daily.   Vitamin D3 2000 units capsule Take 1 capsule (2,000 Units total) by mouth daily.       Allergies:  Allergies  Allergen Reactions  . Cefepime Hives and Shortness Of Breath  . Morphine And Related Shortness Of Breath, Nausea And Vomiting, Swelling and Other (See Comments)    Agitation, tolerates dilaudid Other reaction(s): Other (See Comments) Agitation, tolerates dilaudid  . Penicillins Hives, Shortness Of Breath and Rash    Has patient had a PCN reaction causing immediate rash, facial/tongue/throat swelling, SOB or lightheadedness with hypotension: Yes Has patient had a PCN reaction causing severe rash involving mucus membranes or skin necrosis: Yes Has patient had a PCN reaction that required hospitalization No Has patient had a PCN reaction occurring within the last 10 years: No If all of the above answers are "NO", then may proceed with Cephalosporin use.   [name] Morphine   . Oxycodone-Acetaminophen     Past Medical History, Surgical history, Social history, and Family History were reviewed and updated.  Review of Systems: All other 10 point review of systems is  negative.   Physical Exam:  weight is 145 lb 4 oz (65.9 kg). His oral temperature is 97.7 F (36.5 C). His blood pressure is 132/70 and his pulse is 58 (abnormal). His respiration is 18 and oxygen saturation is 98%.   Wt Readings from Last 3 Encounters:  06/19/17 145 lb 4 oz (65.9 kg)  06/05/17 152 lb (68.9 kg)  05/22/17 146 lb (66.2 kg)    Ocular: Sclerae unicteric, pupils equal, round and reactive to light Ear-nose-throat: Oropharynx clear, dentition fair Lymphatic: No cervical, supraclavicular or axillary adenopathy Lungs no rales or rhonchi, good excursion bilaterally Heart regular rate and rhythm, no murmur appreciated Abd soft, nontender, positive bowel sounds, no liver or spleen tip palpated on exam, no fluid wave  MSK no focal spinal tenderness, no joint edema Neuro: non-focal, well-oriented, appropriate affect Breasts: Deferred  Lab Results  Component Value Date   WBC 6.6 06/19/2017   HGB 10.5 (L) 04/10/2017   HCT 33.7 (L) 06/19/2017   MCV 89.4 06/19/2017   PLT 261 06/19/2017   Lab Results  Component Value Date   FERRITIN 29 06/05/2017   IRON 23 (L) 06/05/2017   TIBC 191 (L) 06/05/2017   UIBC 168 06/05/2017   IRONPCTSAT 12 (L) 06/05/2017   Lab Results  Component Value Date   RETICCTPCT 1.9 03/24/2013   RBC 3.77 (L) 06/19/2017   No results found for: KPAFRELGTCHN, LAMBDASER, KAPLAMBRATIO No results found for: IGGSERUM, IGA, IGMSERUM No results found for: Odetta Pink, SPEI   Chemistry      Component Value Date/Time   NA 138 06/19/2017 1010   NA 140 04/10/2017 1046   NA 135 (L) 03/06/2017 1104   K 4.2 06/19/2017 1010   K 3.7 04/10/2017 1046   K 4.2 03/06/2017 1104   CL 96 (L) 06/19/2017 1010   CL 98 04/10/2017 1046   CO2 31 06/19/2017 1010   CO2 31 04/10/2017 1046   CO2 30 (H) 03/06/2017 1104   BUN 15 06/19/2017 1010   BUN 21 04/10/2017 1046   BUN 20.0 03/06/2017 1104   CREATININE 0.90  06/19/2017 1010   CREATININE 0.9 04/10/2017 1046   CREATININE 1.0 03/06/2017 1104      Component Value Date/Time   CALCIUM 9.1 06/19/2017 1010   CALCIUM 8.9 04/10/2017 1046   CALCIUM 9.4 03/06/2017 1104   ALKPHOS 69 06/19/2017 1010   ALKPHOS 65 04/10/2017 1046   ALKPHOS 78 03/06/2017 1104   AST 16 06/19/2017 1010   AST 20 03/06/2017 1104   ALT 15 06/19/2017 1010   ALT 21 04/10/2017 1046   ALT 20 03/06/2017 1104   BILITOT 0.6 06/19/2017 1010   BILITOT 0.47 03/06/2017 1104      Impression and Plan: Mr. Cundari is a very pleasant 72 yo caucasian gentleman with history of hemochromatosis, homozygous for C282Y mutation. He now has stage IIIc (T2N2M0) melanoma of the left thigh. This was excised in November 2018 and he is now doing well with treatment.  We will proceed with cycle 6 today as planned. I spoke with Dr. Marin Olp and we will now move his treatments out to once a month.  We will hold of on repeating scans unless he develops symptoms.  We will see him back in another month for follow-up, lab and treatment.  He will contact our office with any questions or concerns. We can certainly see him sooner if need be.   Laverna Peace, NP 3/14/201910:54 AM

## 2017-06-29 DIAGNOSIS — C439 Malignant melanoma of skin, unspecified: Secondary | ICD-10-CM | POA: Diagnosis not present

## 2017-06-29 DIAGNOSIS — R69 Illness, unspecified: Secondary | ICD-10-CM | POA: Diagnosis not present

## 2017-06-29 DIAGNOSIS — R112 Nausea with vomiting, unspecified: Secondary | ICD-10-CM | POA: Diagnosis not present

## 2017-06-30 ENCOUNTER — Telehealth: Payer: Self-pay | Admitting: *Deleted

## 2017-06-30 NOTE — Telephone Encounter (Signed)
Patient c/o pain and nausea over the weekend.  He states his nausea was fairly severe over the weekend and he went to an Urgent Care where he was given "a shot of something". He states that treatment did not really work, but he's feeling slightly better today. He had not been taking his prn medications. Instructed patient to take his zofran and compazine on an alternating schedule for the next 48 hours to get control of his nausea. He understood instruction. Also advised patient to take a stool softener or laxative as these medications cause constipation. He has Miralax at home. He understood all instructions confirmed with teach back.  Patient is also experiencing pain to the Left side, from under his port, down his side. He states this pain is aching/burning in nature.   Reviewed symptoms with Dr Marin Olp. He agrees with nausea management described above. For his pain, there is no treatment/oncologic reason for this. He does have a history of rheumatoid arthritis. Dr Marin Olp wants patient to treat his pain with his oral dilaudid which he has at home. Patient understands these instructions.  He knows to call the office with any further complaints or problems.

## 2017-07-01 ENCOUNTER — Telehealth: Payer: Self-pay | Admitting: Pulmonary Disease

## 2017-07-01 MED ORDER — DOXYCYCLINE HYCLATE 100 MG PO TABS: 100.0000 mg | ORAL_TABLET | Freq: Two times a day (BID) | ORAL | 0 refills | Status: DC

## 2017-07-01 NOTE — Telephone Encounter (Signed)
Let him know that  A. Not seen Elsworth Soho since sept 2017. Dx a that time was not a lung tissue or airway issue but a diaphragm muscle issue B. Since then melanoma on immune Rx C. So, current symptoms are likely bronchitis v unknown.  So,  Hard to know how best to Rx due to all of above  Plan - Take doxycycline 176m po twice daily x 5 days; take after meals and avoid sunlight - if worse go to ER or come for visi with [place]  Allergies  Allergen Reactions  . Cefepime Hives and Shortness Of Breath  . Morphine And Related Shortness Of Breath, Nausea And Vomiting, Swelling and Other (See Comments)    Agitation, tolerates dilaudid Other reaction(s): Other (See Comments) Agitation, tolerates dilaudid  . Penicillins Hives, Shortness Of Breath and Rash    Has patient had a PCN reaction causing immediate rash, facial/tongue/throat swelling, SOB or lightheadedness with hypotension: Yes Has patient had a PCN reaction causing severe rash involving mucus membranes or skin necrosis: Yes Has patient had a PCN reaction that required hospitalization No Has patient had a PCN reaction occurring within the last 10 years: No If all of the above answers are "NO", then may proceed with Cephalosporin use.   .Marland KitchenMorphine   . Oxycodone-Acetaminophen

## 2017-07-01 NOTE — Telephone Encounter (Signed)
Spoke with pt. States that he is not feeling well. Reports "burning lung pain", chest tightness on the left and coughing. Cough is producing yellow mucus. Denies wheezing, SOB or fever. His symptoms started over the weekend while he was at the beach. Pt would like recommendations.  MR - please advise as RA is not available. Thanks!

## 2017-07-01 NOTE — Telephone Encounter (Signed)
Spoke with pt, aware of recs.  rx sent to preferred pharmacy.  Scheduled to see SG tomorrow at 3:00.  Nothing further needed at this time.

## 2017-07-02 ENCOUNTER — Encounter: Payer: Self-pay | Admitting: Acute Care

## 2017-07-02 ENCOUNTER — Other Ambulatory Visit: Payer: Self-pay

## 2017-07-02 ENCOUNTER — Ambulatory Visit (INDEPENDENT_AMBULATORY_CARE_PROVIDER_SITE_OTHER): Payer: Medicare Other | Admitting: Acute Care

## 2017-07-02 ENCOUNTER — Ambulatory Visit (INDEPENDENT_AMBULATORY_CARE_PROVIDER_SITE_OTHER)
Admission: RE | Admit: 2017-07-02 | Discharge: 2017-07-02 | Disposition: A | Discharge status: Home / Self Care | Payer: Medicare Other | Source: Ambulatory Visit | Attending: Acute Care | Admitting: Acute Care

## 2017-07-02 DIAGNOSIS — M25512 Pain in left shoulder: Secondary | ICD-10-CM | POA: Diagnosis not present

## 2017-07-02 DIAGNOSIS — Z96611 Presence of right artificial shoulder joint: Secondary | ICD-10-CM | POA: Diagnosis not present

## 2017-07-02 DIAGNOSIS — J438 Other emphysema: Secondary | ICD-10-CM

## 2017-07-02 DIAGNOSIS — R0789 Other chest pain: Secondary | ICD-10-CM

## 2017-07-02 DIAGNOSIS — R0602 Shortness of breath: Secondary | ICD-10-CM | POA: Diagnosis not present

## 2017-07-02 DIAGNOSIS — R05 Cough: Secondary | ICD-10-CM | POA: Diagnosis not present

## 2017-07-02 DIAGNOSIS — M25511 Pain in right shoulder: Secondary | ICD-10-CM | POA: Diagnosis not present

## 2017-07-02 DIAGNOSIS — Z96612 Presence of left artificial shoulder joint: Secondary | ICD-10-CM | POA: Diagnosis not present

## 2017-07-02 MED ORDER — AZITHROMYCIN 250 MG PO TABS: ORAL_TABLET | ORAL | 0 refills | Status: DC

## 2017-07-02 MED ORDER — BENZONATATE 200 MG PO CAPS: 200.0000 mg | ORAL_CAPSULE | Freq: Three times a day (TID) | ORAL | 1 refills | Status: DC | PRN

## 2017-07-02 MED ORDER — PREDNISONE 5 MG PO TABS: ORAL_TABLET | ORAL | 0 refills | Status: DC

## 2017-07-02 NOTE — Progress Notes (Signed)
History of Present Illness [name] is a 72 y.o. male  Never smoker on chronic steroids for rheumatoid arthritis, and chronic sinusitis. She was followed by Dr. Melvyn Novas for cough in the past.    Last seen in the office for pre-op surgical clearance 2017.  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 He is being treated with Adjuvant Nivolumab - q 2 week dosing - start 1 yr of therapy on 04/10/2017.   07/02/2017 Acute OV  Pt. Presents for lung pain. Patient's. Wife  called the office 07/01/2017 and Dr. Chase Caller documented  the following: A. Not seen Elsworth Soho since sept 2017 for surgical clearance.  Dx a that time was not a lung tissue or airway issue but a diaphragm muscle issue B. Since then melanoma on immune Rx C. So, current symptoms are likely bronchitis v unknown.   Plan - Take doxycycline 147m po twice daily x 5 days; take after meals and avoid sunlight - if worse go to ER or come for visit with [place]  Pt. Presents today stating that he has had a cough x 4 days. He is coughing up yellow thick secretions. He has had a low grade fever when cough started.He endorses body aches over the weekend. He is on prednisone 5 mg daily for his RA. He states he increases his dose as needed based on how he feels. He is currently taking 15 mg daily. Dr. RChase Callercalled in Doxycycline 100 mg twice daily 3/26. The patient took one tablet and her has a rash on his trunk that he feels is due to the medication. 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 He is being treated with Adjuvant Nivolumab - q 2 week dosing - start 1 yr of therapy on 04/10/2017. He endorses a fever, no chest pain, orthopnea or hemoptysis.  07/02/2017 CXR>> No acute abnormality.  Test  Results: Reviewed imaging dating back to 2004 that shows right hemidiaphragm elevation PFTs 2011 show moderate restriction with FVC 65%, TLC 76% and DLCO 98% suggesting extra parenchymal cause  CT chest 10/2015 shows mild right lower lobe atelectasis and right hemidiaphragm elevation   CBC Latest Ref Rng & Units 06/19/2017 06/05/2017 05/22/2017  WBC 4.0 - 10.0 K/uL 6.6 5.7 5.2  Hemoglobin 13.0 - 17.1 g/dL - - -  Hematocrit 38.7 - 49.9 % 33.7(L) 31.4(L) 35.7(L)  Platelets 145 - 400 K/uL 261 249 168    BMP Latest Ref Rng & Units 06/19/2017 06/05/2017 05/22/2017  Glucose 73 - 118 mg/dL 129(H) 101 148(H)  BUN 7 - 22 mg/dL _0 Creatinine 0.60 - 1.20 mg/dL 0.90 0.80 0.90  BUN/Creat Ratio 10 - 24 - - -  Sodium 128 - 145 mmol/L 138 132 136  Potassium 3.3 - 4.7 mmol/L 4.2 3.4 3.3  Chloride 98 - 108 mmol/L 96(L) 93(L) 100  CO2 18 - 33 mmol/L 31 32 31  Calcium 8.0 - 10.3 mg/dL 9.1 9.0 9.0    BNP No results found for: BNP  ProBNP    Component Value Date/Time   PROBNP 306.1 (H) 05/06/2013 1321    PFT No results found for: FEV1PRE, FEV1POST, FVCPRE, FVCPOST, TLC, DLCOUNC, PREFEV1FVCRT, PSTFEV1FVCRT  Dg Chest 2 View  Result Date: 07/02/2017 CLINICAL DATA:  Productive  cough and shortness of breath for several days EXAM: CHEST - 2 VIEW COMPARISON:  03/27/2017 FINDINGS: Elevation of the right hemidiaphragm is again noted. Left chest wall port is seen in stable position. The lungs are well aerated without focal infiltrate or sizable effusion. Degenerative changes of the thoracic spine are noted. Postsurgical changes in the shoulder joints and lumbar spine are noted. IMPRESSION: No acute abnormality noted. Electronically Signed   By: Inez Catalina M.D.   On: 07/02/2017 15:17     Past medical hx Past Medical History:  Diagnosis Date  . Alcoholism /alcohol abuse ([place])    per family  . Allergic rhinitis   . Anxiety   . Bacterial infection   . CAD (coronary artery disease)    minimal  coronary plaque in the LAD and right coronary system. 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 7-06. Last nuclear stress 11-17-06 fixed anterior/ inferior defect, no inducible ischemia, EF 81%  . Chronic back pain    "all over back"  . Chronic neck pain   . Depression   . Diverticulosis   . Dyspnea    with exertion  . Dysrhythmia 01-24-12   past hx. A.Fib x1 episode-responded to med.  . Falls frequently    "since 02/2013" (06/16/2013)  . Family history of cancer   . Genetic testing 03/06/2017   Multi-Cancer panel (83 genes) @ [facility] - No pathogenic mutations detected  . GERD (gastroesophageal reflux disease)   . Hemochromatosis    dx'd 14 yrs ago last ferritin Aug 11, 08 52 (22-322), Fe 136 ("I had 250 phlebotomies for that")  . High cholesterol    hx  . Hx of colonic polyps   . Hx of colonoscopy   . Hypertension   . Iron deficiency anemia due to chronic blood loss 12/27/2016  . Malignant melanoma of knee, left ([place])   . Myocardial infarction [facility]) 2006   "related to catheterization"  . Narcotic abuse ([place])    per family  . Osteoarthritis   . PONV (postoperative nausea and vomiting)   . RA (rheumatoid arthritis) ([facility])      Social History   Tobacco Use  . Smoking status: Never Smoker  . Smokeless tobacco: Never Used  Substance Use Topics  . Alcohol use: No    Alcohol/week: 0.0 oz    Comment: "I do not drink anymore"  . Drug use: No    Mr.Dase reports that he has never smoked. He has never used smokeless tobacco. He reports that he does not drink alcohol or use drugs.  Tobacco Cessation: Never smoker  Past surgical hx, Family hx, Social hx all reviewed.  Current Outpatient Medications on File Prior to Visit  Medication Sig  . albuterol (PROVENTIL HFA;VENTOLIN HFA) 108 (90 Base) MCG/ACT inhaler Inhale 1-2 puffs into the lungs every 4 (four) hours as needed for wheezing or shortness of breath.  .  alfuzosin (UROXATRAL) 10 MG 24 hr tablet Take 10 mg by mouth at bedtime.   . ALPRAZolam (XANAX) 0.5 MG tablet Take 1 tablet (0.5 mg total) by mouth 2 (two) times daily as needed. for anxiety  . Cholecalciferol (VITAMIN D3) 2000 UNITS capsule Take 1 capsule (2,000 Units total) by mouth daily.  . clopidogrel (PLAVIX) 75 MG tablet TAKE 1 TABLET BY MOUTH EVERY DAY WITH BREAKFAST  . COLCRYS 0.6 MG tablet Take 1 tablet by mouth every evening.   [name] FLUoxetine (PROZAC) 40 MG capsule Take 1 capsule (  40 mg total) by mouth 2 (two) times daily.  . fluticasone (FLONASE) 50 MCG/ACT nasal spray INSTILL 2 SPRAYS IN EACH NOSTRIL EVERY DAY  . gabapentin (NEURONTIN) 300 MG capsule Take 1 capsule (300 mg total) by mouth 2 (two) times daily.  [name] HYDROmorphone (DILAUDID) 2 MG tablet Take 1 tablet (2 mg total) by mouth every 8 (eight) hours as needed for severe pain.  [name] losartan-hydrochlorothiazide (HYZAAR) 50-12.5 MG tablet TAKE 1 TABLET BY MOUTH DAILY  . Melatonin 5 MG TABS Take 5 mg by mouth at bedtime. Takes 10 mg at HS  . metoprolol tartrate (LOPRESSOR) 25 MG tablet Take 1 tablet (25 mg total) by mouth 2 (two) times daily.  . Omega-3 Fatty Acids (FISH OIL OMEGA-3) 1000 MG CAPS Take 1,000 mg by mouth daily.   . ondansetron (ZOFRAN) 8 MG tablet Take 1 tablet (8 mg total) by mouth every 8 (eight) hours as needed for nausea or vomiting.  . pantoprazole (PROTONIX) 40 MG tablet Take 1 tablet (40 mg total) by mouth every evening.  . pravastatin (PRAVACHOL) 20 MG tablet Take 1 tablet (20 mg total) by mouth daily. (Patient taking differently: Take 20 mg by mouth every evening. )  . predniSONE (DELTASONE) 5 MG tablet Take 5 mg by mouth daily with breakfast.  . prochlorperazine (COMPAZINE) 10 MG tablet TAKE 1 TABLET(10 MG) BY MOUTH EVERY 6 HOURS AS NEEDED FOR NAUSEA OR VOMITING  . tadalafil (CIALIS) 5 MG tablet Take 5 mg by mouth as needed.  . traZODone (DESYREL) 100 MG tablet Take 1 tablet (100 mg total) by mouth at bedtime as  needed for sleep.  . vitamin B-12 (CYANOCOBALAMIN) 1000 MCG tablet Take 1,000 mcg by mouth daily.    No current facility-administered medications on file prior to visit.      Allergies  Allergen Reactions  . Cefepime Hives and Shortness Of Breath  . Morphine And Related Shortness Of Breath, Nausea And Vomiting, Swelling and Other (See Comments)    Agitation, tolerates dilaudid Other reaction(s): Other (See Comments) Agitation, tolerates dilaudid  . Penicillins Hives, Shortness Of Breath and Rash    Has patient had a PCN reaction causing immediate rash, facial/tongue/throat swelling, SOB or lightheadedness with hypotension: Yes Has patient had a PCN reaction causing severe rash involving mucus membranes or skin necrosis: Yes Has patient had a PCN reaction that required hospitalization No Has patient had a PCN reaction occurring within the last 10 years: No If all of the above answers are "NO", then may proceed with Cephalosporin use.   [name] Morphine   . Oxycodone-Acetaminophen   . Doxycycline Rash    Review Of Systems:  Constitutional:   No  weight loss, night sweats,  +Fevers, +chills, fatigue, or  lassitude.  HEENT:   No headaches,  Difficulty swallowing,  Tooth/dental problems, or  Sore throat,                No sneezing, itching, ear ache, +nasal congestion, post nasal drip,   CV:  No chest pain,  Orthopnea, PND, swelling in lower extremities, anasarca, dizziness, palpitations, syncope.   GI  No heartburn, indigestion, abdominal pain, nausea, vomiting, diarrhea, change in bowel habits, loss of appetite, bloody stools.   Resp: No shortness of breath with exertion or at rest.  + excess mucus, + productive cough,  No non-productive cough,  No coughing up of blood.  + change in color of mucus.  + wheezing.  No chest wall deformity  Skin: + rash,  no  lesions.  GU: no dysuria, change in color of urine, no urgency or frequency.  No flank pain, no hematuria   MS:  No joint pain or  swelling.  No decreased range of motion.  No back pain.  Psych:  No change in mood or affect. No depression or anxiety.  No memory loss.   Vital Signs BP 118/66 (BP Location: Left Arm, Cuff Size: Normal)   Pulse 64   Ht 5' 6" (1.676 m)   Wt 140 lb 6.4 oz (63.7 kg)   SpO2 95%   BMI 22.66 kg/m    Physical Exam:  General- No distress,  A&Ox3, pleasant ENT: No sinus tenderness, TM clear, pale nasal mucosa, no oral exudate,no post nasal drip, no LAN, + nasal congestion Cardiac: S1, S2, regular rate and rhythm, no murmur Chest: + wheeze/ rales/ dullness; no accessory muscle use, no nasal flaring, no sternal retractions,+  porta cath Abd.: Soft Non-tender, non-distended, BS + Ext: No clubbing cyanosis, edema Neuro:  normal strength Skin:raised red papillary rash  to trunk of body , left side, warm and dry, no lesions Psych: normal mood and behavior   Assessment/Plan  COPD (chronic obstructive pulmonary disease) Flare Pt. Is undergoing chemotherapy and is immunocompromised Pt. Developed a rash ( Trunk of body)  after one dose of Doxycycline 3/26 ( prescribed by phone) Plan Stop Doxycycline Start Z Pack tonight. Increase  prednisone  To 15 mg daily x 5 days and then return to 10 x 5 days then resume 5 mg daily. Mucinex 1200 mg once daily with a full glass of water  Try some hydrocortisone cream on the rash. Delsym every 12 hours for cough  Tessalon Perles 200 mg at bedtime for cough. Sugar free hard candy for throat soothing. Follow up with Dr. Elsworth Soho or NP  in 4 weeks. Please contact office for sooner follow up if symptoms do not improve or worsen or seek emergency care      Magdalen Spatz, NP 07/02/2017  9:56 PM

## 2017-07-02 NOTE — Patient Instructions (Addendum)
Stop Doxycycline Start Z Pack tonight. Increase  prednisone  To 15 mg daily x 5 days and then return to 10 x 5 days then resume 5 mg daily. Mucinex 1200 mg once daily with a full glass of water  Try some hydrocortisone cream on the rash. Delsym every 12 hours for cough  Tessalon Perles 200 mg at bedtime for cough. Sugar free hard candy for throat soothing. Follow up with Dr. Elsworth Soho or NP  in 4 weeks. Please contact office for sooner follow up if symptoms do not improve or worsen or seek emergency care

## 2017-07-02 NOTE — Assessment & Plan Note (Signed)
Flare Pt. Is undergoing chemotherapy and is immunocompromised Pt. Developed a rash ( Trunk of body)  after one dose of Doxycycline 3/26 ( prescribed by phone) Plan Stop Doxycycline Start Z Pack tonight. Increase  prednisone  To 15 mg daily x 5 days and then return to 10 x 5 days then resume 5 mg daily. Mucinex 1200 mg once daily with a full glass of water  Try some hydrocortisone cream on the rash. Delsym every 12 hours for cough  Tessalon Perles 200 mg at bedtime for cough. Sugar free hard candy for throat soothing. Follow up with Dr. Elsworth Soho or NP  in 4 weeks. Please contact office for sooner follow up if symptoms do not improve or worsen or seek emergency care

## 2017-07-06 DIAGNOSIS — B029 Zoster without complications: Secondary | ICD-10-CM | POA: Diagnosis not present

## 2017-07-07 ENCOUNTER — Ambulatory Visit (INDEPENDENT_AMBULATORY_CARE_PROVIDER_SITE_OTHER): Payer: Medicare Other | Admitting: Internal Medicine

## 2017-07-07 ENCOUNTER — Telehealth: Payer: Self-pay | Admitting: Internal Medicine

## 2017-07-07 ENCOUNTER — Encounter: Payer: Self-pay | Admitting: Internal Medicine

## 2017-07-07 DIAGNOSIS — G894 Chronic pain syndrome: Secondary | ICD-10-CM

## 2017-07-07 DIAGNOSIS — B029 Zoster without complications: Secondary | ICD-10-CM | POA: Diagnosis not present

## 2017-07-07 DIAGNOSIS — F419 Anxiety disorder, unspecified: Secondary | ICD-10-CM | POA: Diagnosis not present

## 2017-07-07 MED ORDER — KETOROLAC TROMETHAMINE 30 MG/ML IJ SOLN
30.0000 mg | Freq: Once | INTRAMUSCULAR | Status: AC
  Administered 2017-07-07: 30 mg via INTRAMUSCULAR

## 2017-07-07 MED ORDER — HYDROMORPHONE HCL 2 MG PO TABS: 2.0000 mg | ORAL_TABLET | Freq: Three times a day (TID) | ORAL | 0 refills | Status: DC | PRN

## 2017-07-07 MED ORDER — ALPRAZOLAM 0.5 MG PO TABS: 0.5000 mg | ORAL_TABLET | Freq: Two times a day (BID) | ORAL | 3 refills | Status: DC | PRN

## 2017-07-07 NOTE — Assessment & Plan Note (Signed)
Xanax prn  Potential benefits of a long term benzodiazepines  use as well as potential risks  and complications were explained to the patient and were aknowledged. Not to take w/dilaudid

## 2017-07-07 NOTE — Assessment & Plan Note (Addendum)
Dilaudid prn  Potential benefits of a long term opioids use as well as potential risks (i.e. addiction risk, apnea etc) and complications (i.e. Somnolence, constipation and others) were explained to the patient and were aknowledged. Toradol IM

## 2017-07-07 NOTE — Addendum Note (Signed)
Addended by: Karren Cobble on: 07/07/2017 11:17 AM   Modules accepted: Orders

## 2017-07-07 NOTE — Telephone Encounter (Signed)
Spoke to pharmacist and got RX fixed

## 2017-07-07 NOTE — Progress Notes (Signed)
Subjective:  Patient ID: [name], male    DOB: [birth year]  Age: 72 y.o. MRN: [id number]  CC: No chief complaint on file.   HPI [name] presents for shingles f/u. He was seen at [facility] 1 week ago and yesterday: he was dx'd w/shingles - he has not started Acyclovir yet..cancer/o L CP F/u anxiety   Outpatient Medications Prior to Visit  Medication Sig Dispense Refill  . acyclovir (ZOVIRAX) 800 MG tablet TK 1 T PO FID  1  . albuterol (PROVENTIL HFA;VENTOLIN HFA) 108 (90 Base) MCG/ACT inhaler Inhale 1-2 puffs into the lungs every 4 (four) hours as needed for wheezing or shortness of breath. 1 Inhaler 1  . alfuzosin (UROXATRAL) 10 MG 24 hr tablet Take 10 mg by mouth at bedtime.   0  . ALPRAZolam (XANAX) 0.5 MG tablet Take 1 tablet (0.5 mg total) by mouth 2 (two) times daily as needed. for anxiety 60 tablet 3  . azithromycin (ZITHROMAX) 250 MG tablet Take 2 pills today then one a day for 4 additional days 6 tablet 0  . benzonatate (TESSALON) 200 MG capsule Take 1 capsule (200 mg total) by mouth 3 (three) times daily as needed for cough. 30 capsule 1  . Cholecalciferol (VITAMIN D3) 2000 UNITS capsule Take 1 capsule (2,000 Units total) by mouth daily. 100 capsule 3  . clopidogrel (PLAVIX) 75 MG tablet TAKE 1 TABLET BY MOUTH EVERY DAY WITH BREAKFAST 90 tablet 3  . COLCRYS 0.6 MG tablet Take 1 tablet by mouth every evening.   2  . FLUoxetine (PROZAC) 40 MG capsule Take 1 capsule (40 mg total) by mouth 2 (two) times daily. 180 capsule 2  . fluticasone (FLONASE) 50 MCG/ACT nasal spray INSTILL 2 SPRAYS IN EACH NOSTRIL EVERY DAY 16 g 11  . gabapentin (NEURONTIN) 300 MG capsule Take 1 capsule (300 mg total) by mouth 2 (two) times daily. 180 capsule 1  . HYDROmorphone (DILAUDID) 2 MG tablet Take 1 tablet (2 mg total) by mouth every 8 (eight) hours as needed for severe pain. 120 tablet 0  . losartan-hydrochlorothiazide (HYZAAR) 50-12.5 MG tablet TAKE 1 TABLET BY MOUTH DAILY 90 tablet 0    . Melatonin 5 MG TABS Take 5 mg by mouth at bedtime. Takes 10 mg at HS    . metoprolol tartrate (LOPRESSOR) 25 MG tablet Take 1 tablet (25 mg total) by mouth 2 (two) times daily. 180 tablet 1  . Omega-3 Fatty Acids (FISH OIL OMEGA-3) 1000 MG CAPS Take 1,000 mg by mouth daily.     . ondansetron (ZOFRAN) 8 MG tablet Take 1 tablet (8 mg total) by mouth every 8 (eight) hours as needed for nausea or vomiting. 30 tablet 1  . pantoprazole (PROTONIX) 40 MG tablet Take 1 tablet (40 mg total) by mouth every evening. 90 tablet 1  . pravastatin (PRAVACHOL) 20 MG tablet Take 1 tablet (20 mg total) by mouth daily. (Patient taking differently: Take 20 mg by mouth every evening. ) 90 tablet 1  . predniSONE (DELTASONE) 5 MG tablet Take 5 mg by mouth daily with breakfast.    . predniSONE (DELTASONE) 5 MG tablet Take 3 tablets for 5 days, then 2 tablets for 5 days for illness 25 tablet 0  . prochlorperazine (COMPAZINE) 10 MG tablet TAKE 1 TABLET(10 MG) BY MOUTH EVERY 6 HOURS AS NEEDED FOR NAUSEA OR VOMITING 385 tablet 1  . tadalafil (CIALIS) 5 MG tablet Take 5 mg by mouth as needed.    [name]  traZODone (DESYREL) 100 MG tablet Take 1 tablet (100 mg total) by mouth at bedtime as needed for sleep. 30 tablet 3  . vitamin B-12 (CYANOCOBALAMIN) 1000 MCG tablet Take 1,000 mcg by mouth daily.      No facility-administered medications prior to visit.     ROS Review of Systems  Constitutional: Negative for appetite change, fatigue and unexpected weight change.  HENT: Negative for congestion, nosebleeds, sneezing, sore throat and trouble swallowing.   Eyes: Negative for itching and visual disturbance.  Respiratory: Negative for cough.   Cardiovascular: Positive for chest pain. Negative for palpitations and leg swelling.  Gastrointestinal: Negative for abdominal distention, blood in stool, diarrhea and nausea.  Genitourinary: Negative for frequency and hematuria.  Musculoskeletal: Positive for back pain. Negative for gait  problem, joint swelling and neck pain.  Skin: Positive for rash.  Neurological: Negative for dizziness, tremors, speech difficulty and weakness.  Psychiatric/Behavioral: Negative for agitation, dysphoric mood and sleep disturbance. The patient is not nervous/anxious.     Objective:  BP 128/76 (BP Location: Right Arm, Patient Position: Sitting, Cuff Size: Large)   Pulse 65   Temp 98.6 F (37 C) (Oral)   Ht 5' 6"  (1.676 m)   Wt 139 lb (63 kg)   SpO2 98%   BMI 22.44 kg/m   BP Readings from Last 3 Encounters:  07/07/17 128/76  07/02/17 118/66  06/19/17 132/70    Wt Readings from Last 3 Encounters:  07/07/17 139 lb (63 kg)  07/02/17 140 lb 6.4 oz (63.7 kg)  06/19/17 145 lb 4 oz (65.9 kg)    Physical Exam  Constitutional: He is oriented to person, place, and time. He appears well-developed. No distress.  NAD  HENT:  Mouth/Throat: Oropharynx is clear and moist.  Eyes: Pupils are equal, round, and reactive to light. Conjunctivae are normal.  Neck: Normal range of motion. No JVD present. No thyromegaly present.  Cardiovascular: Normal rate, regular rhythm, normal heart sounds and intact distal pulses. Exam reveals no gallop and no friction rub.  No murmur heard. Pulmonary/Chest: Effort normal and breath sounds normal. No respiratory distress. He has no wheezes. He has no rales. He exhibits no tenderness.  Abdominal: Soft. Bowel sounds are normal. He exhibits no distension and no mass. There is no tenderness. There is no rebound and no guarding.  Musculoskeletal: Normal range of motion. He exhibits tenderness. He exhibits no edema.  Lymphadenopathy:    He has no cervical adenopathy.  Neurological: He is alert and oriented to person, place, and time. He has normal reflexes. No cranial nerve deficit. He exhibits normal muscle tone. He displays a negative Romberg sign. Coordination abnormal. Gait normal.  Skin: Skin is warm and dry. Rash noted.  Psychiatric: He has a normal mood and  affect. His behavior is normal. Judgment and thought content normal.  zoster rash on L chest LS tender  Lab Results  Component Value Date   WBC 6.6 06/19/2017   HGB 10.5 (L) 04/10/2017   HCT 33.7 (L) 06/19/2017   PLT 261 06/19/2017   GLUCOSE 129 (H) 06/19/2017   CHOL 128 03/10/2015   TRIG 95.0 03/10/2015   HDL 39.30 03/10/2015   LDLCALC 70 03/10/2015   ALT 15 06/19/2017   AST 16 06/19/2017   NA 138 06/19/2017   K 4.2 06/19/2017   CL 96 (L) 06/19/2017   CREATININE 0.90 06/19/2017   BUN 15 06/19/2017   CO2 31 06/19/2017   TSH 0.899 06/19/2017   PSA 1.95 10/15/2010  INR 1.1 (H) 06/14/2014   HGBA1C 6.0 09/28/2015    Dg Chest 2 View  Result Date: 07/02/2017 CLINICAL DATA:  Productive cough and shortness of breath for several days EXAM: CHEST - 2 VIEW COMPARISON:  03/27/2017 FINDINGS: Elevation of the right hemidiaphragm is again noted. Left chest wall port is seen in stable position. The lungs are well aerated without focal infiltrate or sizable effusion. Degenerative changes of the thoracic spine are noted. Postsurgical changes in the shoulder joints and lumbar spine are noted. IMPRESSION: No acute abnormality noted. Electronically Signed   By: Inez Catalina M.D.   On: 07/02/2017 15:17    Assessment & Plan:   There are no diagnoses linked to this encounter. I am having [name]. Cham maintain his Vitamin D3, FISH OIL OMEGA-3, alfuzosin, Melatonin, COLCRYS, FLUoxetine, pravastatin, albuterol, losartan-hydrochlorothiazide, fluticasone, tadalafil, predniSONE, vitamin B-12, pantoprazole, clopidogrel, metoprolol tartrate, ondansetron, prochlorperazine, ALPRAZolam, HYDROmorphone, traZODone, gabapentin, azithromycin, benzonatate, predniSONE, and acyclovir.  No orders of the defined types were placed in this encounter.    Follow-up: No follow-ups on file.  [name], MD

## 2017-07-07 NOTE — Assessment & Plan Note (Signed)
L chest Acyclovir

## 2017-07-07 NOTE — Telephone Encounter (Signed)
Copied from [place] [id number]. Topic: Quick Communication - Rx Refill/Question >> Jul 07, 2017 12:16 PM Waylan Rocher, Lumin L wrote: Medication: HYDROmorphone (DILAUDID) 2 MG tablet  Has the patient contacted their pharmacy? Yes.   (Agent: If no, request that the patient contact the pharmacy for the refill.) Preferred Pharmacy (with phone number or street name): [facility] - [address] [place] [place] [address] Phone: [phone] Fax: [phone] Agent: Please be advised that RX refills may take up to 3 business days. We ask that you follow-up with your pharmacy.  Pharmacist needs Dr. Alain Marion to clarify the script for dilauded. The instruction do not match up with the frequency of the script.

## 2017-07-14 ENCOUNTER — Ambulatory Visit (INDEPENDENT_AMBULATORY_CARE_PROVIDER_SITE_OTHER): Payer: Medicare Other

## 2017-07-14 DIAGNOSIS — B029 Zoster without complications: Secondary | ICD-10-CM | POA: Diagnosis not present

## 2017-07-14 MED ORDER — KETOROLAC TROMETHAMINE 30 MG/ML IJ SOLN
30.0000 mg | Freq: Once | INTRAMUSCULAR | Status: AC
  Administered 2017-07-14: 30 mg via INTRAMUSCULAR

## 2017-07-14 NOTE — Progress Notes (Signed)
ket

## 2017-07-18 ENCOUNTER — Other Ambulatory Visit: Payer: Self-pay | Admitting: Internal Medicine

## 2017-07-18 DIAGNOSIS — D2271 Melanocytic nevi of right lower limb, including hip: Secondary | ICD-10-CM | POA: Diagnosis not present

## 2017-07-18 DIAGNOSIS — B029 Zoster without complications: Secondary | ICD-10-CM | POA: Diagnosis not present

## 2017-07-18 DIAGNOSIS — D485 Neoplasm of uncertain behavior of skin: Secondary | ICD-10-CM | POA: Diagnosis not present

## 2017-07-18 DIAGNOSIS — D1801 Hemangioma of skin and subcutaneous tissue: Secondary | ICD-10-CM | POA: Diagnosis not present

## 2017-07-18 DIAGNOSIS — D225 Melanocytic nevi of trunk: Secondary | ICD-10-CM | POA: Diagnosis not present

## 2017-07-18 DIAGNOSIS — D692 Other nonthrombocytopenic purpura: Secondary | ICD-10-CM | POA: Diagnosis not present

## 2017-07-18 DIAGNOSIS — L720 Epidermal cyst: Secondary | ICD-10-CM | POA: Diagnosis not present

## 2017-07-18 DIAGNOSIS — Z85820 Personal history of malignant melanoma of skin: Secondary | ICD-10-CM | POA: Diagnosis not present

## 2017-07-18 DIAGNOSIS — L821 Other seborrheic keratosis: Secondary | ICD-10-CM | POA: Diagnosis not present

## 2017-07-18 DIAGNOSIS — L570 Actinic keratosis: Secondary | ICD-10-CM | POA: Diagnosis not present

## 2017-07-21 ENCOUNTER — Ambulatory Visit (INDEPENDENT_AMBULATORY_CARE_PROVIDER_SITE_OTHER): Payer: Medicare Other | Admitting: Internal Medicine

## 2017-07-21 ENCOUNTER — Encounter: Payer: Self-pay | Admitting: Internal Medicine

## 2017-07-21 DIAGNOSIS — I10 Essential (primary) hypertension: Secondary | ICD-10-CM

## 2017-07-21 DIAGNOSIS — M05711 Rheumatoid arthritis with rheumatoid factor of right shoulder without organ or systems involvement: Secondary | ICD-10-CM | POA: Diagnosis not present

## 2017-07-21 DIAGNOSIS — G894 Chronic pain syndrome: Secondary | ICD-10-CM

## 2017-07-21 DIAGNOSIS — B029 Zoster without complications: Secondary | ICD-10-CM | POA: Diagnosis not present

## 2017-07-21 MED ORDER — HYDROMORPHONE HCL 2 MG PO TABS: 2.0000 mg | ORAL_TABLET | Freq: Three times a day (TID) | ORAL | 0 refills | Status: DC | PRN

## 2017-07-21 NOTE — Assessment & Plan Note (Signed)
Dilaudid prn  Potential benefits of a long term opioids use as well as potential risks (i.e. addiction risk, apnea etc) and complications (i.e. Somnolence, constipation and others) were explained to the patient and were aknowledged.

## 2017-07-21 NOTE — Assessment & Plan Note (Signed)
Improving.

## 2017-07-21 NOTE — Assessment & Plan Note (Signed)
Lopressor and Losartan

## 2017-07-21 NOTE — Assessment & Plan Note (Signed)
Predn 5 mg/d

## 2017-07-21 NOTE — Progress Notes (Signed)
Subjective:  Patient ID: [name], male    DOB: 1945-10-01  Age: 72 y.o. MRN: [id number]  CC: No chief complaint on file.   HPI AZAI GAFFIN presents for Zoster, RA w/chronic pain, anxiety f/u  Outpatient Medications Prior to Visit  Medication Sig Dispense Refill  . albuterol (PROVENTIL HFA;VENTOLIN HFA) 108 (90 Base) MCG/ACT inhaler Inhale 1-2 puffs into the lungs every 4 (four) hours as needed for wheezing or shortness of breath. 1 Inhaler 1  . alfuzosin (UROXATRAL) 10 MG 24 hr tablet Take 10 mg by mouth at bedtime.   0  . ALPRAZolam (XANAX) 0.5 MG tablet Take 1 tablet (0.5 mg total) by mouth 2 (two) times daily as needed. for anxiety 60 tablet 3  . benzonatate (TESSALON) 200 MG capsule Take 1 capsule (200 mg total) by mouth 3 (three) times daily as needed for cough. 30 capsule 1  . Cholecalciferol (VITAMIN D3) 2000 UNITS capsule Take 1 capsule (2,000 Units total) by mouth daily. 100 capsule 3  . clopidogrel (PLAVIX) 75 MG tablet TAKE 1 TABLET BY MOUTH EVERY DAY WITH BREAKFAST 90 tablet 3  . COLCRYS 0.6 MG tablet Take 1 tablet by mouth every evening.   2  . FLUoxetine (PROZAC) 40 MG capsule Take 1 capsule (40 mg total) by mouth 2 (two) times daily. 180 capsule 2  . fluticasone (FLONASE) 50 MCG/ACT nasal spray INSTILL 2 SPRAYS IN EACH NOSTRIL EVERY DAY 16 g 11  . gabapentin (NEURONTIN) 300 MG capsule Take 1 capsule (300 mg total) by mouth 2 (two) times daily. 180 capsule 1  . HYDROmorphone (DILAUDID) 2 MG tablet Take 1 tablet (2 mg total) by mouth every 8 (eight) hours as needed for severe pain. 120 tablet 0  . losartan-hydrochlorothiazide (HYZAAR) 50-12.5 MG tablet TAKE 1 TABLET BY MOUTH DAILY 90 tablet 1  . Melatonin 5 MG TABS Take 5 mg by mouth at bedtime. Takes 10 mg at HS    . metoprolol tartrate (LOPRESSOR) 25 MG tablet Take 1 tablet (25 mg total) by mouth 2 (two) times daily. 180 tablet 1  . Omega-3 Fatty Acids (FISH OIL OMEGA-3) 1000 MG CAPS Take 1,000 mg by mouth  daily.     . ondansetron (ZOFRAN) 8 MG tablet Take 1 tablet (8 mg total) by mouth every 8 (eight) hours as needed for nausea or vomiting. 30 tablet 1  . pantoprazole (PROTONIX) 40 MG tablet Take 1 tablet (40 mg total) by mouth every evening. 90 tablet 1  . pravastatin (PRAVACHOL) 20 MG tablet Take 1 tablet (20 mg total) by mouth daily. (Patient taking differently: Take 20 mg by mouth every evening. ) 90 tablet 1  . predniSONE (DELTASONE) 5 MG tablet Take 5 mg by mouth daily with breakfast.    . prochlorperazine (COMPAZINE) 10 MG tablet TAKE 1 TABLET(10 MG) BY MOUTH EVERY 6 HOURS AS NEEDED FOR NAUSEA OR VOMITING 385 tablet 1  . tadalafil (CIALIS) 5 MG tablet Take 5 mg by mouth as needed.    . traZODone (DESYREL) 100 MG tablet Take 1 tablet (100 mg total) by mouth at bedtime as needed for sleep. 30 tablet 3  . vitamin B-12 (CYANOCOBALAMIN) 1000 MCG tablet Take 1,000 mcg by mouth daily.     [name] acyclovir (ZOVIRAX) 800 MG tablet TK 1 T PO FID  1  . azithromycin (ZITHROMAX) 250 MG tablet Take 2 pills today then one a day for 4 additional days 6 tablet 0  . losartan-hydrochlorothiazide (HYZAAR) 50-12.5 MG tablet  TAKE 1 TABLET BY MOUTH DAILY 90 tablet 0  . predniSONE (DELTASONE) 5 MG tablet Take 3 tablets for 5 days, then 2 tablets for 5 days for illness (Patient not taking: Reported on 07/21/2017) 25 tablet 0   No facility-administered medications prior to visit.     ROS Review of Systems  Objective:  BP 124/78 (BP Location: Left Arm, Patient Position: Sitting, Cuff Size: Normal)   Pulse 63   Temp 98.3 F (36.8 C) (Oral)   Ht 5' 6"  (1.676 m)   Wt 147 lb (66.7 kg)   SpO2 97%   BMI 23.73 kg/m   BP Readings from Last 3 Encounters:  07/21/17 124/78  07/07/17 128/76  07/02/17 118/66    Wt Readings from Last 3 Encounters:  07/21/17 147 lb (66.7 kg)  07/07/17 139 lb (63 kg)  07/02/17 140 lb 6.4 oz (63.7 kg)    Physical Exam  Lab Results  Component Value Date   WBC 6.6 06/19/2017   HGB  10.5 (L) 04/10/2017   HCT 33.7 (L) 06/19/2017   PLT 261 06/19/2017   GLUCOSE 129 (H) 06/19/2017   CHOL 128 03/10/2015   TRIG 95.0 03/10/2015   HDL 39.30 03/10/2015   LDLCALC 70 03/10/2015   ALT 15 06/19/2017   AST 16 06/19/2017   NA 138 06/19/2017   K 4.2 06/19/2017   CL 96 (L) 06/19/2017   CREATININE 0.90 06/19/2017   BUN 15 06/19/2017   CO2 31 06/19/2017   TSH 0.899 06/19/2017   PSA 1.95 10/15/2010   INR 1.1 (H) 06/14/2014   HGBA1C 6.0 09/28/2015    Dg Chest 2 View  Result Date: 07/02/2017 CLINICAL DATA:  Productive cough and shortness of breath for several days EXAM: CHEST - 2 VIEW COMPARISON:  03/27/2017 FINDINGS: Elevation of the right hemidiaphragm is again noted. Left chest wall port is seen in stable position. The lungs are well aerated without focal infiltrate or sizable effusion. Degenerative changes of the thoracic spine are noted. Postsurgical changes in the shoulder joints and lumbar spine are noted. IMPRESSION: No acute abnormality noted. Electronically Signed   By: Inez Catalina M.D.   On: 07/02/2017 15:17    Assessment & Plan:   There are no diagnoses linked to this encounter. I have discontinued Kellyn Mansfield. Scheller's azithromycin and acyclovir. I am also having him maintain his Vitamin D3, FISH OIL OMEGA-3, alfuzosin, Melatonin, COLCRYS, FLUoxetine, pravastatin, albuterol, fluticasone, tadalafil, predniSONE, vitamin B-12, pantoprazole, clopidogrel, metoprolol tartrate, ondansetron, prochlorperazine, traZODone, gabapentin, benzonatate, ALPRAZolam, HYDROmorphone, and losartan-hydrochlorothiazide.  No orders of the defined types were placed in this encounter.    Follow-up: No follow-ups on file.  [name], MD

## 2017-07-24 ENCOUNTER — Inpatient Hospital Stay: Payer: Medicare Other

## 2017-07-24 ENCOUNTER — Encounter: Payer: Self-pay | Admitting: Hematology & Oncology

## 2017-07-24 ENCOUNTER — Inpatient Hospital Stay: Payer: Medicare Other | Attending: Hematology & Oncology | Admitting: Hematology & Oncology

## 2017-07-24 ENCOUNTER — Ambulatory Visit (HOSPITAL_BASED_OUTPATIENT_CLINIC_OR_DEPARTMENT_OTHER)
Admission: RE | Admit: 2017-07-24 | Discharge: 2017-07-24 | Disposition: A | Discharge status: Home / Self Care | Payer: Medicare Other | Source: Ambulatory Visit | Attending: Hematology & Oncology | Admitting: Hematology & Oncology

## 2017-07-24 ENCOUNTER — Other Ambulatory Visit: Payer: Self-pay

## 2017-07-24 VITALS — BP 138/70 | HR 67 | Temp 97.8°F | Resp 18 | Wt 144.0 lb

## 2017-07-24 DIAGNOSIS — E032 Hypothyroidism due to medicaments and other exogenous substances: Secondary | ICD-10-CM

## 2017-07-24 DIAGNOSIS — D500 Iron deficiency anemia secondary to blood loss (chronic): Secondary | ICD-10-CM

## 2017-07-24 DIAGNOSIS — C4372 Malignant melanoma of left lower limb, including hip: Secondary | ICD-10-CM | POA: Diagnosis not present

## 2017-07-24 DIAGNOSIS — Z5112 Encounter for antineoplastic immunotherapy: Secondary | ICD-10-CM | POA: Insufficient documentation

## 2017-07-24 DIAGNOSIS — R600 Localized edema: Secondary | ICD-10-CM | POA: Diagnosis not present

## 2017-07-24 DIAGNOSIS — M79662 Pain in left lower leg: Secondary | ICD-10-CM

## 2017-07-24 DIAGNOSIS — Z79899 Other long term (current) drug therapy: Secondary | ICD-10-CM | POA: Insufficient documentation

## 2017-07-24 DIAGNOSIS — M7989 Other specified soft tissue disorders: Secondary | ICD-10-CM | POA: Diagnosis not present

## 2017-07-24 LAB — CMP (CANCER CENTER ONLY)
ALT: 15 U/L (ref 10–47)
AST: 20 U/L (ref 11–38)
Albumin: 3.2 g/dL — ABNORMAL LOW (ref 3.5–5.0)
Alkaline Phosphatase: 65 U/L (ref 26–84)
Anion gap: 6 (ref 5–15)
BUN: 13 mg/dL (ref 7–22)
CO2: 29 mmol/L (ref 18–33)
Calcium: 8.4 mg/dL (ref 8.0–10.3)
Chloride: 97 mmol/L — ABNORMAL LOW (ref 98–108)
Creatinine: 0.9 mg/dL (ref 0.60–1.20)
Glucose, Bld: 134 mg/dL — ABNORMAL HIGH (ref 73–118)
Potassium: 3.6 mmol/L (ref 3.3–4.7)
Sodium: 132 mmol/L (ref 128–145)
Total Bilirubin: 0.7 mg/dL (ref 0.2–1.6)
Total Protein: 6 g/dL — ABNORMAL LOW (ref 6.4–8.1)

## 2017-07-24 LAB — FERRITIN: Ferritin: 56 ng/mL (ref 22–316)

## 2017-07-24 LAB — IRON AND TIBC
Iron: 28 ug/dL — ABNORMAL LOW (ref 42–163)
Saturation Ratios: 15 % — ABNORMAL LOW (ref 42–163)
TIBC: 180 ug/dL — ABNORMAL LOW (ref 202–409)
UIBC: 152 ug/dL

## 2017-07-24 LAB — CBC WITH DIFFERENTIAL (CANCER CENTER ONLY)
Basophils Absolute: 0.1 10*3/uL (ref 0.0–0.1)
Basophils Relative: 1 %
Eosinophils Absolute: 0 10*3/uL (ref 0.0–0.5)
Eosinophils Relative: 0 %
HCT: 31.7 % — ABNORMAL LOW (ref 38.7–49.9)
Hemoglobin: 10.4 g/dL — ABNORMAL LOW (ref 13.0–17.1)
Lymphocytes Relative: 23 %
Lymphs Abs: 1.2 10*3/uL (ref 0.9–3.3)
MCH: 29.1 pg (ref 28.0–33.4)
MCHC: 32.8 g/dL (ref 32.0–35.9)
MCV: 88.5 fL (ref 82.0–98.0)
Monocytes Absolute: 0.7 10*3/uL (ref 0.1–0.9)
Monocytes Relative: 13 %
Neutro Abs: 3.3 10*3/uL (ref 1.5–6.5)
Neutrophils Relative %: 63 %
Platelet Count: 248 10*3/uL (ref 145–400)
RBC: 3.58 MIL/uL — ABNORMAL LOW (ref 4.20–5.70)
RDW: 15.8 % — ABNORMAL HIGH (ref 11.1–15.7)
WBC Count: 5.2 10*3/uL (ref 4.0–10.0)

## 2017-07-24 LAB — TSH: TSH: 0.799 u[IU]/mL (ref 0.320–4.118)

## 2017-07-24 MED ORDER — SODIUM CHLORIDE 0.9% FLUSH
10.0000 mL | INTRAVENOUS | Status: DC | PRN
  Administered 2017-07-24: 10 mL
  Filled 2017-07-24: qty 10

## 2017-07-24 MED ORDER — SODIUM CHLORIDE 0.9 % IV SOLN
480.0000 mg | Freq: Once | INTRAVENOUS | Status: AC
  Administered 2017-07-24: 480 mg via INTRAVENOUS
  Filled 2017-07-24: qty 48

## 2017-07-24 MED ORDER — SODIUM CHLORIDE 0.9 % IV SOLN
Freq: Once | INTRAVENOUS | Status: AC
  Administered 2017-07-24: 13:00:00 via INTRAVENOUS

## 2017-07-24 MED ORDER — SODIUM CHLORIDE 0.9 % IV SOLN
510.0000 mg | Freq: Once | INTRAVENOUS | Status: AC
  Administered 2017-07-24: 510 mg via INTRAVENOUS
  Filled 2017-07-24: qty 17

## 2017-07-24 MED ORDER — HEPARIN SOD (PORK) LOCK FLUSH 100 UNIT/ML IV SOLN
500.0000 [IU] | Freq: Once | INTRAVENOUS | Status: AC | PRN
  Administered 2017-07-24: 500 [IU]
  Filled 2017-07-24: qty 5

## 2017-07-24 NOTE — Progress Notes (Signed)
Hematology and Oncology Follow Up Visit  [name] [id number] [birth year] 72 y.o. 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  He is feeling tired.  Wound he was last here, his iron studies were on the low side.  His ferritin was 24 with iron saturation of only 13%.  As such, we will go ahead and give him a dose of IV iron.  Otherwise he is done quite well with the Nivolumab.  He really has had no issues with the Nivolumab.  He has had no bleeding.  He has had no diarrhea.  He has had no rashes.  Of note he did have an episode of shingles in the left T5 dermatome about 3 weeks ago.  He was treated by his family doctor.  This is better.  He has very little in the way of postherpetic neuralgia.  He has had no fever.  He has had some leg swelling of the left leg.  We will see about getting a Doppler of the left leg to make sure there is no blood clot.   Overall, his performance status is ECOG 1.  Medications:  Allergies as of 07/24/2017      Reactions   Cefepime Hives, Shortness Of Breath   Cephaeline Hives   Morphine And Related Shortness Of Breath, Nausea And Vomiting, Swelling, Other (See Comments)   Agitation, tolerates dilaudid Other reaction(s): Other (See Comments) Agitation, tolerates dilaudid   Penicillins Hives, Shortness Of Breath, Rash   Has patient had a PCN reaction causing immediate rash, facial/tongue/throat swelling, SOB or lightheadedness with hypotension: Yes Has patient had a PCN reaction causing severe rash involving mucus membranes or skin necrosis: Yes Has patient had a PCN reaction that  required hospitalization No Has patient had a PCN reaction occurring within the last 10 years: No If all of the above answers are "NO", then may proceed with Cephalosporin use.   Morphine    Oxycodone-acetaminophen    Doxycycline Rash      Medication List        Accurate as of 07/24/17 11:19 AM. Always use your most recent med list.          albuterol 108 (90 Base) MCG/ACT inhaler Commonly known as:  PROVENTIL HFA;VENTOLIN HFA Inhale 1-2 puffs into the lungs every 4 (four) hours as needed for wheezing or shortness of breath.   alfuzosin 10 MG 24 hr tablet Commonly known as:  UROXATRAL Take 10 mg by mouth at bedtime.   ALPRAZolam 0.5 MG tablet Commonly known as:  XANAX Take 1 tablet (0.5 mg total) by mouth 2 (two) times daily as needed. for anxiety   benzonatate 200 MG capsule Commonly known as:  TESSALON Take 1 capsule (200 mg total) by mouth 3 (three) times daily as needed for cough.   CIALIS 5 MG tablet Generic drug:  tadalafil Take 5 mg by mouth as needed.   clopidogrel 75 MG tablet Commonly known as:  PLAVIX TAKE 1 TABLET BY MOUTH EVERY DAY WITH BREAKFAST   COLCRYS 0.6 MG tablet Generic drug:  colchicine Take 1 tablet by mouth every evening.   FISH OIL OMEGA-3 1000 MG  Caps Take 1,000 mg by mouth daily.   FLUoxetine 40 MG capsule Commonly known as:  PROZAC Take 1 capsule (40 mg total) by mouth 2 (two) times daily.   fluticasone 50 MCG/ACT nasal spray Commonly known as:  FLONASE INSTILL 2 SPRAYS IN EACH NOSTRIL EVERY DAY   gabapentin 300 MG capsule Commonly known as:  NEURONTIN Take 1 capsule (300 mg total) by mouth 2 (two) times daily.   HYDROmorphone 2 MG tablet Commonly known as:  DILAUDID Take 1 tablet (2 mg total) by mouth every 8 (eight) hours as needed for severe pain. Please fill on or after 08/06/17   losartan-hydrochlorothiazide 50-12.5 MG tablet Commonly known as:  HYZAAR TAKE 1 TABLET BY MOUTH DAILY   Melatonin 5 MG Tabs Take 5 mg by  mouth at bedtime. Takes 10 mg at HS   metoprolol tartrate 25 MG tablet Commonly known as:  LOPRESSOR Take 1 tablet (25 mg total) by mouth 2 (two) times daily.   ondansetron 8 MG tablet Commonly known as:  ZOFRAN Take 1 tablet (8 mg total) by mouth every 8 (eight) hours as needed for nausea or vomiting.   pantoprazole 40 MG tablet Commonly known as:  PROTONIX Take 1 tablet (40 mg total) by mouth every evening.   pravastatin 20 MG tablet Commonly known as:  PRAVACHOL Take 1 tablet (20 mg total) by mouth daily.   predniSONE 5 MG tablet Commonly known as:  DELTASONE Take 5 mg by mouth daily with breakfast.   prochlorperazine 10 MG tablet Commonly known as:  COMPAZINE TAKE 1 TABLET(10 MG) BY MOUTH EVERY 6 HOURS AS NEEDED FOR NAUSEA OR VOMITING   traZODone 100 MG tablet Commonly known as:  DESYREL Take 1 tablet (100 mg total) by mouth at bedtime as needed for sleep.   vitamin B-12 1000 MCG tablet Commonly known as:  CYANOCOBALAMIN Take 1,000 mcg by mouth daily.   Vitamin D3 2000 units capsule Take 1 capsule (2,000 Units total) by mouth daily.       Allergies:  Allergies  Allergen Reactions  . Cefepime Hives and Shortness Of Breath  . Cephaeline Hives  . Morphine And Related Shortness Of Breath, Nausea And Vomiting, Swelling and Other (See Comments)    Agitation, tolerates dilaudid Other reaction(s): Other (See Comments) Agitation, tolerates dilaudid  . Penicillins Hives, Shortness Of Breath and Rash    Has patient had a PCN reaction causing immediate rash, facial/tongue/throat swelling, SOB or lightheadedness with hypotension: Yes Has patient had a PCN reaction causing severe rash involving mucus membranes or skin necrosis: Yes Has patient had a PCN reaction that required hospitalization No Has patient had a PCN reaction occurring within the last 10 years: No If all of the above answers are "NO", then may proceed with Cephalosporin use.   [name] Morphine   .  Oxycodone-Acetaminophen   . Doxycycline Rash    Past Medical History, Surgical history, Social history, and Family History were reviewed and updated.  Review of Systems: Review of Systems  Constitutional: Positive for malaise/fatigue.  HENT: Negative.   Eyes: Negative.   Respiratory: Positive for shortness of breath.   Cardiovascular: Negative.   Gastrointestinal: Positive for nausea.  Genitourinary: Negative.   Musculoskeletal: Positive for joint pain and myalgias.  Skin: Negative.   Neurological: Positive for tingling and focal weakness.  Endo/Heme/Allergies: Negative.   Psychiatric/Behavioral: Negative.      Physical Exam:  weight is 144 lb (65.3 kg). His oral temperature is 97.8 F (36.6 C). His blood  pressure is 138/70 and his pulse is 67. His respiration is 18 and oxygen saturation is 100%.   Wt Readings from Last 3 Encounters:  07/24/17 144 lb (65.3 kg)  07/21/17 147 lb (66.7 kg)  07/07/17 139 lb (63 kg)    Physical Exam  Constitutional: He is oriented to person, place, and time.  HENT:  Head: Normocephalic and atraumatic.  Mouth/Throat: Oropharynx is clear and moist.  Eyes: Pupils are equal, round, and reactive to light. EOM are normal.  Neck: Normal range of motion.  Cardiovascular: Normal rate, regular rhythm and normal heart sounds.  Pulmonary/Chest: Effort normal and breath sounds normal.  Abdominal: Soft. Bowel sounds are normal.  Musculoskeletal: Normal range of motion. He exhibits no edema, tenderness or deformity.  Lymphadenopathy:    He has no cervical adenopathy.  Neurological: He is alert and oriented to person, place, and time.  Skin: Skin is warm and dry. No rash noted. No erythema.  Psychiatric: He has a normal mood and affect. His behavior is normal. Judgment and thought content normal.  Vitals reviewed.    Lab Results  Component Value Date   WBC 5.2 07/24/2017   HGB 10.5 (L) 04/10/2017   HCT 31.7 (L) 07/24/2017   MCV 88.5 07/24/2017    PLT 248 07/24/2017   Lab Results  Component Value Date   FERRITIN 24 06/19/2017   IRON 28 (L) 06/19/2017   TIBC 213 06/19/2017   UIBC 185 06/19/2017   IRONPCTSAT 13 (L) 06/19/2017   Lab Results  Component Value Date   RETICCTPCT 1.9 03/24/2013   RBC 3.58 (L) 07/24/2017   No results found for: KPAFRELGTCHN, LAMBDASER, KAPLAMBRATIO No results found for: IGGSERUM, IGA, IGMSERUM No results found for: Odetta Pink, SPEI   Chemistry      Component Value Date/Time   NA 138 06/19/2017 1010   NA 140 04/10/2017 1046   NA 135 (L) 03/06/2017 1104   K 4.2 06/19/2017 1010   K 3.7 04/10/2017 1046   K 4.2 03/06/2017 1104   CL 96 (L) 06/19/2017 1010   CL 98 04/10/2017 1046   CO2 31 06/19/2017 1010   CO2 31 04/10/2017 1046   CO2 30 (H) 03/06/2017 1104   BUN 15 06/19/2017 1010   BUN 21 04/10/2017 1046   BUN 20.0 03/06/2017 1104   CREATININE 0.90 06/19/2017 1010   CREATININE 0.9 04/10/2017 1046   CREATININE 1.0 03/06/2017 1104      Component Value Date/Time   CALCIUM 9.1 06/19/2017 1010   CALCIUM 8.9 04/10/2017 1046   CALCIUM 9.4 03/06/2017 1104   ALKPHOS 69 06/19/2017 1010   ALKPHOS 65 04/10/2017 1046   ALKPHOS 78 03/06/2017 1104   AST 16 06/19/2017 1010   AST 20 03/06/2017 1104   ALT 15 06/19/2017 1010   ALT 21 04/10/2017 1046   ALT 20 03/06/2017 1104   BILITOT 0.6 06/19/2017 1010   BILITOT 0.47 03/06/2017 1104      Impression and Plan: [name] is a very pleasant 72 yo caucasian gentleman with history of hemochromatosis, homozygous for C282Y mutation. He now has stage IIIc (T2N2M0) melanoma of the left thigh. This was excised in November 2018 and he is now doing well with treatment.    We will proceed with cycle #7 today as planned.   We will now make his treatments once a month.  I think this will be much more conducive for his quality of life.  We will give him a  dose of iron today.  We will check his left leg  to make sure that there is no blood clot.  If all looks good, we will get him back in 1 month.  [name], MD 4/18/201911:19 AM

## 2017-07-24 NOTE — Patient Instructions (Signed)
Nivolumab injection What is this medicine? NIVOLUMAB (nye VOL ue mab) is a monoclonal antibody. It is used to treat melanoma, lung cancer, kidney cancer, head and neck cancer, Hodgkin lymphoma, urothelial cancer, colon cancer, and liver cancer. This medicine may be used for other purposes; ask your health care provider or pharmacist if you have questions. COMMON BRAND NAME(S): Opdivo What should I tell my health care provider before I take this medicine? They need to know if you have any of these conditions: -diabetes -immune system problems -kidney disease -liver disease -lung disease -organ transplant -stomach or intestine problems -thyroid disease -an unusual or allergic reaction to nivolumab, other medicines, foods, dyes, or preservatives -pregnant or trying to get pregnant -breast-feeding How should I use this medicine? This medicine is for infusion into a vein. It is given by a health care professional in a hospital or clinic setting. A special MedGuide will be given to you before each treatment. Be sure to read this information carefully each time. Talk to your pediatrician regarding the use of this medicine in children. While this drug may be prescribed for children as young as 12 years for selected conditions, precautions do apply. Overdosage: If you think you have taken too much of this medicine contact a poison control center or emergency room at once. NOTE: This medicine is only for you. Do not share this medicine with others. What if I miss a dose? It is important not to miss your dose. Call your doctor or health care professional if you are unable to keep an appointment. What may interact with this medicine? Interactions have not been studied. Give your health care provider a list of all the medicines, herbs, non-prescription drugs, or dietary supplements you use. Also tell them if you smoke, drink alcohol, or use illegal drugs. Some items may interact with your  medicine. This list may not describe all possible interactions. Give your health care provider a list of all the medicines, herbs, non-prescription drugs, or dietary supplements you use. Also tell them if you smoke, drink alcohol, or use illegal drugs. Some items may interact with your medicine. What should I watch for while using this medicine? This drug may make you feel generally unwell. Continue your course of treatment even though you feel ill unless your doctor tells you to stop. You may need blood work done while you are taking this medicine. Do not become pregnant while taking this medicine or for 5 months after stopping it. Women should inform their doctor if they wish to become pregnant or think they might be pregnant. There is a potential for serious side effects to an unborn child. Talk to your health care professional or pharmacist for more information. Do not breast-feed an infant while taking this medicine. What side effects may I notice from receiving this medicine? Side effects that you should report to your doctor or health care professional as soon as possible: -allergic reactions like skin rash, itching or hives, swelling of the face, lips, or tongue -black, tarry stools -blood in the urine -bloody or watery diarrhea -changes in vision -change in sex drive -changes in emotions or moods -chest pain -confusion -cough -decreased appetite -diarrhea -facial flushing -feeling faint or lightheaded -fever, chills -hair loss -hallucination, loss of contact with reality -headache -irritable -joint pain -loss of memory -muscle pain -muscle weakness -seizures -shortness of breath -signs and symptoms of high blood sugar such as dizziness; dry mouth; dry skin; fruity breath; nausea; stomach pain; increased hunger or thirst; increased  urination -signs and symptoms of kidney injury like trouble passing urine or change in the amount of urine -signs and symptoms of liver injury  like dark yellow or brown urine; general ill feeling or flu-like symptoms; light-colored stools; loss of appetite; nausea; right upper belly pain; unusually weak or tired; yellowing of the eyes or skin -stiff neck -swelling of the ankles, feet, hands -weight gain Side effects that usually do not require medical attention (report to your doctor or health care professional if they continue or are bothersome): -bone pain -constipation -tiredness -vomiting This list may not describe all possible side effects. Call your doctor for medical advice about side effects. You may report side effects to FDA at 1-800-FDA-1088. Where should I keep my medicine? This drug is given in a hospital or clinic and will not be stored at home. NOTE: This sheet is a summary. It may not cover all possible information. If you have questions about this medicine, talk to your doctor, pharmacist, or health care provider.  2018 [employer] (2016-01-01 17:49:34) Ferumoxytol injection What is this medicine? FERUMOXYTOL is an iron complex. Iron is used to make healthy red blood cells, which carry oxygen and nutrients throughout the body. This medicine is used to treat iron deficiency anemia in people with chronic kidney disease. This medicine may be used for other purposes; ask your health care provider or pharmacist if you have questions. COMMON BRAND NAME(S): Feraheme What should I tell my health care provider before I take this medicine? They need to know if you have any of these conditions: -anemia not caused by low iron levels -high levels of iron in the blood -magnetic resonance imaging (MRI) test scheduled -an unusual or allergic reaction to iron, other medicines, foods, dyes, or preservatives -pregnant or trying to get pregnant -breast-feeding How should I use this medicine? This medicine is for injection into a vein. It is given by a health care professional in a hospital or clinic setting. Talk to  your pediatrician regarding the use of this medicine in children. Special care may be needed. Overdosage: If you think you have taken too much of this medicine contact a poison control center or emergency room at once. NOTE: This medicine is only for you. Do not share this medicine with others. What if I miss a dose? It is important not to miss your dose. Call your doctor or health care professional if you are unable to keep an appointment. What may interact with this medicine? This medicine may interact with the following medications: -other iron products This list may not describe all possible interactions. Give your health care provider a list of all the medicines, herbs, non-prescription drugs, or dietary supplements you use. Also tell them if you smoke, drink alcohol, or use illegal drugs. Some items may interact with your medicine. What should I watch for while using this medicine? Visit your doctor or healthcare professional regularly. Tell your doctor or healthcare professional if your symptoms do not start to get better or if they get worse. You may need blood work done while you are taking this medicine. You may need to follow a special diet. Talk to your doctor. Foods that contain iron include: whole grains/cereals, dried fruits, beans, or peas, leafy green vegetables, and organ meats (liver, kidney). What side effects may I notice from receiving this medicine? Side effects that you should report to your doctor or health care professional as soon as possible: -allergic reactions like skin rash, itching or hives, swelling of  the face, lips, or tongue -breathing problems -changes in blood pressure -feeling faint or lightheaded, falls -fever or chills -flushing, sweating, or hot feelings -swelling of the ankles or feet Side effects that usually do not require medical attention (report to your doctor or health care professional if they continue or are  bothersome): -diarrhea -headache -nausea, vomiting -stomach pain This list may not describe all possible side effects. Call your doctor for medical advice about side effects. You may report side effects to FDA at 1-800-FDA-1088. Where should I keep my medicine? This drug is given in a hospital or clinic and will not be stored at home. NOTE: This sheet is a summary. It may not cover all possible information. If you have questions about this medicine, talk to your doctor, pharmacist, or health care provider.  2018 [employer] Standard (2015-04-27 12:41:49)

## 2017-07-24 NOTE — Patient Instructions (Signed)

## 2017-07-24 NOTE — Addendum Note (Signed)
Addended by: Burney Gauze R on: 07/24/2017 01:45 PM   Modules accepted: Orders

## 2017-08-04 ENCOUNTER — Ambulatory Visit: Payer: Medicare Other | Admitting: Acute Care

## 2017-08-11 ENCOUNTER — Encounter: Payer: Self-pay | Admitting: Internal Medicine

## 2017-08-11 DIAGNOSIS — L720 Epidermal cyst: Secondary | ICD-10-CM | POA: Diagnosis not present

## 2017-08-11 DIAGNOSIS — Z85820 Personal history of malignant melanoma of skin: Secondary | ICD-10-CM | POA: Diagnosis not present

## 2017-08-15 NOTE — Progress Notes (Addendum)
Subjective:   [name] is a 72 y.o. male who presents for Medicare Annual/Subsequent preventive examination.  Review of Systems:  No ROS.  Medicare Wellness Visit. Additional risk factors are reflected in the social history.  Cardiac Risk Factors include: advanced age (>79mn, >>25women);hypertension;male gender Sleep patterns: gets up 1 times nightly to void and sleeps 7 hours nightly.    Home Safety/Smoke Alarms: Feels safe in home. Smoke alarms in place.  Living environment; residence and Firearm Safety: 2-story house, no firearms.Lives with wife, no needs for DME, good support system Seat Belt Safety/Bike Helmet: Wears seat belt.     Objective:    Vitals: BP 128/78   Pulse (!) 57   Resp 17   Ht 5' 6"  (1.676 m)   Wt 141 lb (64 kg)   SpO2 95%   BMI 22.76 kg/m   Body mass index is 22.76 kg/m.  Advanced Directives 08/18/2017 06/19/2017 06/05/2017 05/22/2017 05/08/2017 04/24/2017 04/10/2017  Does Patient Have a Medical Advance Directive? Yes Yes Yes No No - Yes  Type of AParamedicof ASophiaLiving will [place]Living will - [place] will [place] will - [place] will  Does patient want to make changes to medical advance directive? - No - Patient declined No - Patient declined - - - -  Copy of [place] Chart? No - copy requested - - - No - copy requested Yes Yes  Pre-existing out of facility DNR order (yellow form or pink MOST form) - - - - - - -    Tobacco Social History   Tobacco Use  Smoking Status Never Smoker  Smokeless Tobacco Never Used     Counseling given: Not Answered  Past Medical History:  Diagnosis Date  . Alcoholism /alcohol abuse ([place]    per family  . Allergic rhinitis   . Anxiety   . Bacterial infection   . CAD (coronary artery disease)    minimal coronary plaque in the LAD and right coronary  system. 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 7-06. Last nuclear stress 11-17-06 fixed anterior/ inferior defect, no inducible ischemia, EF 81%  . Chronic back pain    "all over back"  . Chronic neck pain   . Depression   . Diverticulosis   . Dyspnea    with exertion  . Dysrhythmia 01-24-12   past hx. A.Fib x1 episode-responded to med.  . Falls frequently    "since 02/2013" (06/16/2013)  . Family history of cancer   . Genetic testing 03/06/2017   Multi-Cancer panel (83 genes) @ [facility] - No pathogenic mutations detected  . GERD (gastroesophageal reflux disease)   . Hemochromatosis    dx'd 14 yrs ago last ferritin Aug 11, 08 52 (22-322), Fe 136 ("I had 250 phlebotomies for that")  . High cholesterol    hx  . Hx of colonic polyps   . Hx of colonoscopy   . Hypertension   . Iron deficiency anemia due to chronic blood loss 12/27/2016  . Malignant melanoma of knee, left ([place]   . Myocardial infarction ([facility] 2006   "related to catheterization"  . Narcotic abuse ([place]    per family  . Osteoarthritis   . PONV (postoperative nausea and vomiting)   . RA (rheumatoid arthritis) ([place]    Past Surgical History:  Procedure Laterality Date  . ABDOMINAL ADHESION SURGERY  ~  [place] Right 6-09   [facility]  . APPENDECTOMY  ~ 1956  . ASPIRATION OF ABSCESS Left 03/27/2017   Procedure: ASPIRATION OF SEROMA;  Surgeon: Stark Klein, MD;  Location: [facility];  Service: General;  Laterality: Left;  . BONE TUMOR RESECTION  ~ 1954   "taken off my mastoid"  . CARPAL TUNNEL RELEASE Right 1990's  . CATARACT EXTRACTION, BILATERAL Bilateral 01-24-12  . CORONARY ANGIOPLASTY WITH STENT PLACEMENT  2006   "while repairing 1st stent, a second area tore and they had to place 2nd stent " ?LAD & CX  . CORONARY ANGIOPLASTY WITH STENT PLACEMENT  06/17/2014  . CYST EXCISION  "several OR's"   "backX 2, back of my  neck, face, inside right bicept, chest, wrist"  . FOOT SURGERY Right 11-08   for removal of bone spurs-  . HAMMER TOE SURGERY Right 07/2012   "broke 4 hammertoes"   . HARDWARE REMOVAL  03/09/2012   Procedure: HARDWARE REMOVAL;  Surgeon: Nita Sells, MD;  Location: [facility];  Service: Orthopedics;  Laterality: Left;  Hardware Removal from Left Shoulder  . HARVEST BONE GRAFT  02/06/2012   Procedure: HARVEST ILIAC BONE GRAFT;  Surgeon: Nita Sells, MD;  [facility];  Service: Orthopedics;;  bone marrow aspirqation   . INGUINAL HERNIA REPAIR Bilateral   . JOINT REPLACEMENT    . KNEE SURGERY Left ~ 2003   "6-12 months after uni knee removed synovial sack"  . LEFT HEART CATHETERIZATION WITH CORONARY ANGIOGRAM N/A 06/17/2014   PCI of diffuse severe stenosis in the proximal to mid LAD using overlapping drug-eluting stents.  . LUMBAR WOUND DEBRIDEMENT N/A 03/17/2013   Procedure: Incision and drainage of superficial lumbar wound;  Surgeon: Floyce Stakes, MD;  Location: [place] [facility];  Service: Neurosurgery;  Laterality: N/A;  Incision and drainage of superficial lumbar wound  . MECKEL DIVERTICULUM EXCISION  ~ 1956  . MELANOMA EXCISION WITH SENTINEL LYMPH NODE BIOPSY Left 02/12/2017   WIDE LOCAL EXCISION LEFT KNEE MELANOMA, ADVANCEMENT FLAP CLOSURE, AND SENTINEL LYMPH NODE MAPPING AND BIOPSY.  [name] MELANOMA EXCISION WITH SENTINEL LYMPH NODE BIOPSY Left 02/12/2017   Procedure: WIDE LOCAL EXCISION LEFT KNEE MELANOMA, ADVANCEMENT FLAP CLOSURE, AND SENTINEL LYMPH NODE MAPPING AND BIOPSY.;  Surgeon: Stark Klein, MD;  Location: [facility];  Service: General;  Laterality: Left;  GENERAL AND LOCAL  . ORIF SHOULDER FRACTURE  02/06/2012   Procedure: OPEN REDUCTION INTERNAL FIXATION (ORIF) SHOULDER FRACTURE;  Surgeon: Nita Sells, MD;  [facility];  Service: Orthopedics;  Laterality: Left;  ORIF of a Left Shoulder Fracture with  Iliac Crest Bone Graft  aspiration   . PORTACATH PLACEMENT Left 03/27/2017   Procedure: INSERTION PORT-A-CATH;  Surgeon: Stark Klein, MD;  Location: [place];  Service: General;  Laterality: Left;  . POSTERIOR LUMBAR FUSION  12-10   L4-5 diskectomy w/ fusion, cage placement and rods; [place]  . POSTERIOR LUMBAR FUSION 4 LEVEL N/A 03/02/2013   Procedure: Lumbar One to Sacral One Posterior lumbar interbody fusion;  Surgeon: Floyce Stakes, MD;  Location: [place] [facility];  Service: Neurosurgery;  Laterality: N/A;  L1 to S1 Posterior lumbar interbody fusion  . REPLACEMENT UNICONDYLAR JOINT KNEE Left ~ 2003   "~ 6 months after total knee replaced"  . SHOULDER ARTHROSCOPY Left ~ 2004 X 2   "@ [facility]; left bone splinter in & had to clean it out"  . TOTAL ANKLE REPLACEMENT  Right 2008   at [facility]  . TOTAL KNEE ARTHROPLASTY Bilateral 2002  . TOTAL SHOULDER REPLACEMENT Left 2006  . TOTAL SHOULDER REPLACEMENT Right ~ 2007   Dr. Marlou Sa   Family History  Problem Relation Age of Onset  . Uterine cancer Mother        uterine vs. cervix? dx 41s; deceased 37  . Macular degeneration Mother   . Other Mother        ankle edema  . Lung cancer Mother   . Melanoma Mother        multiple spots on legs  . Cancer - Lung Mother        smoker  . Coronary artery disease Father   . Hypertension Father   . Prostate cancer Father   . Colon polyps Father   . Cancer - Prostate Father 23       deceased 5  . Heart attack Brother   . Hyperlipidemia Brother   . Other Brother        Schizophrenic  . Thyroid disease Sister   . Hemochromatosis Sister   . Rheum arthritis Sister   . Coronary artery disease Maternal Aunt   . Heart attack Maternal Aunt   . Cancer Maternal Grandmother        unk. type; deceased 55s  . Diabetes Neg Hx   . Colon cancer Neg Hx   . Esophageal cancer Neg Hx   . Rectal cancer Neg Hx   . Stomach cancer Neg Hx    Social History   Socioeconomic History  . Marital status: Significant Other     Spouse name: Not on file  . Number of children: 2  . Years of education: 67  . Highest education level: Not on file  Occupational History  . 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    Comment: self employed  Social Needs  . Financial resource strain: Not hard at all  . Food insecurity:    Worry: Never true    Inability: Never true  . Transportation needs:    Medical: No    Non-medical: No  Tobacco Use  . Smoking status: Never Smoker  . Smokeless tobacco: Never Used  Substance and Sexual Activity  . Alcohol use: No    Alcohol/week: 0.0 oz    Comment: "I do not drink anymore"  . Drug use: No  . Sexual activity: Yes    Partners: Female  Lifestyle  . Physical activity:    Days per week: 0 days    Minutes per session: 0 min  . Stress: Very much  Relationships  . Social connections:    Talks on phone: More than three times a week    Gets together: More than three times a week    Attends religious service: More than 4 times per year    Active member of club or organization: Yes    Attends meetings of clubs or organizations: More than 4 times per year    Relationship status: Living with partner  Other Topics Concern  . Not on file  Social History Narrative   [employer] [place]. Married 1972-89yr seperated/divorced. Engaged April '11. 1 son '74; step-daughter '70. 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 During '11 discovered an [employer]- $500,000+ loss. Is handling this OK - will keep the business running.    Outpatient Encounter Medications as of 08/18/2017  Medication Sig  . albuterol (PROVENTIL HFA;VENTOLIN HFA) 108 (90 Base) MCG/ACT inhaler Inhale 1-2 puffs into the lungs every 4 (four) hours as needed for wheezing or shortness of breath.  . alfuzosin (UROXATRAL) 10 MG 24 hr tablet Take 10 mg by mouth at bedtime.   . ALPRAZolam (XANAX) 0.5  MG tablet Take 1 tablet (0.5 mg total) by mouth 2 (two) times daily as needed. for anxiety  . benzonatate (TESSALON) 200 MG capsule Take 1 capsule (200 mg total) by mouth 3 (three) times daily as needed for cough.  . Cholecalciferol (VITAMIN D3) 2000 UNITS capsule Take 1 capsule (2,000 Units total) by mouth daily.  . clopidogrel (PLAVIX) 75 MG tablet TAKE 1 TABLET BY MOUTH EVERY DAY WITH BREAKFAST  . COLCRYS 0.6 MG tablet Take 1 tablet by mouth every evening.   [name] FLUoxetine (PROZAC) 40 MG capsule Take 1 capsule (40 mg total) by mouth 2 (two) times daily.  . fluticasone (FLONASE) 50 MCG/ACT nasal spray INSTILL 2 SPRAYS IN EACH NOSTRIL EVERY DAY  . gabapentin (NEURONTIN) 300 MG capsule Take 1 capsule (300 mg total) by mouth 2 (two) times daily.  [name] HYDROmorphone (DILAUDID) 2 MG tablet Take 1 tablet (2 mg total) by mouth every 8 (eight) hours as needed for severe pain. Please fill on or after 08/06/17  . losartan-hydrochlorothiazide (HYZAAR) 50-12.5 MG tablet TAKE 1 TABLET BY MOUTH DAILY  . Melatonin 5 MG TABS Take 5 mg by mouth at bedtime. Takes 10 mg at HS  . metoprolol tartrate (LOPRESSOR) 25 MG tablet Take 1 tablet (25 mg total) by mouth 2 (two) times daily.  . Omega-3 Fatty Acids (FISH OIL OMEGA-3) 1000 MG CAPS Take 1,000 mg by mouth daily.   . ondansetron (ZOFRAN) 8 MG tablet Take 1 tablet (8 mg total) by mouth every 8 (eight) hours as needed for nausea or vomiting.  . pantoprazole (PROTONIX) 40 MG tablet Take 1 tablet (40 mg total) by mouth every evening.  . pravastatin (PRAVACHOL) 20 MG tablet Take 1 tablet (20 mg total) by mouth daily. (Patient taking differently: Take 20 mg by mouth every evening. )  . predniSONE (DELTASONE) 5 MG tablet Take 5 mg by mouth daily with breakfast.  . prochlorperazine (COMPAZINE) 10 MG tablet TAKE 1 TABLET(10 MG) BY MOUTH EVERY 6 HOURS AS NEEDED FOR NAUSEA OR VOMITING  . tadalafil (CIALIS) 5 MG tablet Take 5 mg by mouth as needed.  . traZODone (DESYREL) 100 MG  tablet Take 1 tablet (100 mg total) by mouth at bedtime as needed for sleep.  . vitamin B-12 (CYANOCOBALAMIN) 1000 MCG tablet Take 1,000 mcg by mouth daily.   . [DISCONTINUED] gabapentin (NEURONTIN) 300 MG capsule Take 1 capsule (300 mg total) by mouth 2 (two) times daily.   No facility-administered encounter medications on file as of 08/18/2017.     Activities of Daily Living In your present state of health, do you have any difficulty performing the following activities: 08/18/2017 03/27/2017  Hearing? N N  Vision? N N  Difficulty concentrating or making decisions? N N  Walking or climbing stairs? Y Y  Comment - -  Dressing or bathing? N N  Doing errands, shopping? N -  Preparing Food and eating ? N -  Using the Toilet? N -  In the past six months, have you accidently leaked urine? N -  Do you have problems with loss of bowel control? N -  Managing your Medications? N -  Managing your Finances? N -  Housekeeping  or managing your Housekeeping? N -  Some recent data might be hidden    Patient Care Team: Plotnikov, Evie Lacks, MD as PCP - General (Internal Medicine) Jackolyn Confer, MD as Consulting Physician (General Surgery) Star Age, MD as Attending Physician (Neurology) Comer, Okey Regal, MD as Consulting Physician (Infectious Diseases) Leeroy Cha, MD as Consulting Physician (Neurosurgery) Milus Banister, MD as Attending Physician (Gastroenterology) [name], MD as Consulting Physician (Oncology)   Assessment:   This is a routine wellness examination for [name]. Physical assessment deferred to PCP.   Exercise Activities and Dietary recommendations Current Exercise Habits: The patient does not participate in regular exercise at present(chair exercises provided), Exercise limited by: orthopedic condition(s)  Diet (meal preparation, eat out, water intake, caffeinated beverages, dairy products, fruits and vegetables): in general, a "healthy" diet  , on average,  1-2 meals per day, Poor appetite due to chemotherapy.  encouraged patient to increase daily water intake and to continue to drink Boost (coupons provided)   Goals    . Patient Stated     Stay as healthy and as independent as possible. Increase the amount that I eat.        Fall Risk Fall Risk  08/18/2017 08/07/2016 10/26/2015 12/10/2013  Falls in the past year? No No No Yes  Number falls in past yr: - - - 2 or more  Risk Factor Category  - - - High Fall Risk  Risk for fall due to : - - - History of fall(s);Impaired balance/gait  Risk for fall due to: Comment - - - "much better since out of hospital"    Depression Screen PHQ 2/9 Scores 08/18/2017 08/07/2016 08/07/2016 10/26/2015  PHQ - 2 Score 0 0 0 0  PHQ- 9 Score 0 - - -    Cognitive Function MMSE - Mini Mental State Exam 08/18/2017  Orientation to time 5  Orientation to Place 5  Registration 3  Attention/ Calculation 5  Recall 2  Language- name 2 objects 2  Language- repeat 1  Language- follow 3 step command 3  Language- read & follow direction 1  Write a sentence 1  Copy design 1  Total score 29        Immunization History  Administered Date(s) Administered  . Influenza Whole 01/01/2008, 01/10/2009, 01/10/2010, 03/16/2012  . Influenza, High Dose Seasonal PF 12/17/2016  . Influenza,inj,Quad PF,6+ Mos 01/05/2013, 12/10/2013, 12/05/2014  . Influenza-Unspecified 12/25/2015  . Pneumococcal Conjugate-13 12/31/2013  . Pneumococcal Polysaccharide-23 01/01/2008, 03/10/2015  . Tdap 07/23/2011  . Zoster 04/06/2012   Screening Tests Health Maintenance  Topic Date Due  . Hepatitis C Screening  Jan 29, 1946  . COLONOSCOPY  08/19/2018 (Originally 01/30/2017)  . INFLUENZA VACCINE  11/06/2017  . TETANUS/TDAP  07/22/2021  . PNA vac Low Risk Adult  Completed      Plan:    Continue doing brain stimulating activities (puzzles, reading, adult coloring books, staying active) to keep memory sharp.   Continue to eat heart healthy  diet (full of fruits, vegetables, whole grains, lean protein, water--limit salt, fat, and sugar intake) and increase physical activity as tolerated.  I have personally reviewed and noted the following in the patient's chart:   . Medical and social history . Use of alcohol, tobacco or illicit drugs  . Current medications and supplements . Functional ability and status . Nutritional status . Physical activity . Advanced directives . List of other physicians . Vitals . Screenings to include cognitive, depression, and falls . Referrals and appointments  In addition, I have reviewed and discussed with patient certain preventive protocols, quality metrics, and best practice recommendations. A written personalized care plan for preventive services as well as general preventive health recommendations were provided to patient.     Michiel Cowboy, RN  08/18/2017  Medical screening examination/treatment/procedure(s) were performed by non-physician practitioner and as supervising physician I was immediately available for consultation/collaboration. I agree with above. Lew Dawes, MD

## 2017-08-18 ENCOUNTER — Ambulatory Visit (INDEPENDENT_AMBULATORY_CARE_PROVIDER_SITE_OTHER): Payer: Medicare Other | Admitting: *Deleted

## 2017-08-18 ENCOUNTER — Ambulatory Visit: Payer: Medicare Other | Admitting: Internal Medicine

## 2017-08-18 VITALS — BP 128/78 | HR 57 | Resp 17 | Ht 66.0 in | Wt 141.0 lb

## 2017-08-18 DIAGNOSIS — Z0000 Encounter for general adult medical examination without abnormal findings: Secondary | ICD-10-CM | POA: Diagnosis not present

## 2017-08-18 MED ORDER — GABAPENTIN 300 MG PO CAPS: 300.0000 mg | ORAL_CAPSULE | Freq: Two times a day (BID) | ORAL | 1 refills | Status: DC

## 2017-08-18 NOTE — Patient Instructions (Addendum)
www.auntbertha.com or down load app on smart phone  Aunt Berenice Primas website lists multiple social resources for individuals such as: food, health, money, house hold goods, transit, medical supplies, job training and legal services.  Continue doing brain stimulating activities (puzzles, reading, adult coloring books, staying active) to keep memory sharp.   Continue to eat heart healthy diet (full of fruits, vegetables, whole grains, lean protein, water--limit salt, fat, and sugar intake) and increase physical activity as tolerated.   [name] , Thank you for taking time to come for your Medicare Wellness Visit. I appreciate your ongoing commitment to your health goals. Please review the following plan we discussed and let me know if I can assist you in the future.   These are the goals we discussed: Goals    . Patient Stated     Stay as healthy and as independent as possible. Increase the amount that I eat.        This is a list of the screening recommended for you and due dates:  Health Maintenance  Topic Date Due  .  Hepatitis C: One time screening is recommended by Center for Disease Control  (CDC) for  adults born from [birth year] through 1963.   [birth year]  . Colon Cancer Screening  01/30/2017  . Flu Shot  11/06/2017  . Tetanus Vaccine  07/22/2021  . Pneumonia vaccines  Completed

## 2017-08-19 ENCOUNTER — Other Ambulatory Visit: Payer: Self-pay | Admitting: Hematology & Oncology

## 2017-08-21 ENCOUNTER — Other Ambulatory Visit: Payer: Self-pay | Admitting: Oncology

## 2017-08-21 ENCOUNTER — Inpatient Hospital Stay: Payer: Medicare Other

## 2017-08-21 ENCOUNTER — Other Ambulatory Visit: Payer: Self-pay

## 2017-08-21 ENCOUNTER — Inpatient Hospital Stay: Payer: Medicare Other | Attending: Hematology & Oncology | Admitting: Family

## 2017-08-21 VITALS — BP 112/60 | HR 60 | Temp 97.5°F | Resp 18 | Wt 141.0 lb

## 2017-08-21 DIAGNOSIS — R634 Abnormal weight loss: Secondary | ICD-10-CM | POA: Insufficient documentation

## 2017-08-21 DIAGNOSIS — M79662 Pain in left lower leg: Secondary | ICD-10-CM

## 2017-08-21 DIAGNOSIS — J449 Chronic obstructive pulmonary disease, unspecified: Secondary | ICD-10-CM | POA: Diagnosis not present

## 2017-08-21 DIAGNOSIS — R630 Anorexia: Secondary | ICD-10-CM

## 2017-08-21 DIAGNOSIS — Z5112 Encounter for antineoplastic immunotherapy: Secondary | ICD-10-CM | POA: Diagnosis not present

## 2017-08-21 DIAGNOSIS — Z79899 Other long term (current) drug therapy: Secondary | ICD-10-CM | POA: Diagnosis not present

## 2017-08-21 DIAGNOSIS — K5909 Other constipation: Secondary | ICD-10-CM | POA: Insufficient documentation

## 2017-08-21 DIAGNOSIS — R531 Weakness: Secondary | ICD-10-CM | POA: Diagnosis not present

## 2017-08-21 DIAGNOSIS — C4372 Malignant melanoma of left lower limb, including hip: Secondary | ICD-10-CM | POA: Insufficient documentation

## 2017-08-21 DIAGNOSIS — D500 Iron deficiency anemia secondary to blood loss (chronic): Secondary | ICD-10-CM

## 2017-08-21 DIAGNOSIS — E032 Hypothyroidism due to medicaments and other exogenous substances: Secondary | ICD-10-CM

## 2017-08-21 DIAGNOSIS — M7989 Other specified soft tissue disorders: Principal | ICD-10-CM

## 2017-08-21 DIAGNOSIS — R5383 Other fatigue: Secondary | ICD-10-CM | POA: Insufficient documentation

## 2017-08-21 LAB — CBC WITH DIFFERENTIAL (CANCER CENTER ONLY)
Basophils Absolute: 0 10*3/uL (ref 0.0–0.1)
Basophils Relative: 1 %
Eosinophils Absolute: 0 10*3/uL (ref 0.0–0.5)
Eosinophils Relative: 0 %
HCT: 34.4 % — ABNORMAL LOW (ref 38.7–49.9)
Hemoglobin: 11.3 g/dL — ABNORMAL LOW (ref 13.0–17.1)
Lymphocytes Relative: 16 %
Lymphs Abs: 0.9 10*3/uL (ref 0.9–3.3)
MCH: 29.7 pg (ref 28.0–33.4)
MCHC: 32.8 g/dL (ref 32.0–35.9)
MCV: 90.5 fL (ref 82.0–98.0)
Monocytes Absolute: 0.7 10*3/uL (ref 0.1–0.9)
Monocytes Relative: 13 %
Neutro Abs: 3.8 10*3/uL (ref 1.5–6.5)
Neutrophils Relative %: 70 %
Platelet Count: 251 10*3/uL (ref 145–400)
RBC: 3.8 MIL/uL — ABNORMAL LOW (ref 4.20–5.70)
RDW: 16 % — ABNORMAL HIGH (ref 11.1–15.7)
WBC Count: 5.4 10*3/uL (ref 4.0–10.0)

## 2017-08-21 LAB — CMP (CANCER CENTER ONLY)
ALT: 18 U/L (ref 10–47)
AST: 16 U/L (ref 11–38)
Albumin: 3.1 g/dL — ABNORMAL LOW (ref 3.5–5.0)
Alkaline Phosphatase: 60 U/L (ref 26–84)
Anion gap: 5 (ref 5–15)
BUN: 11 mg/dL (ref 7–22)
CO2: 30 mmol/L (ref 18–33)
Calcium: 9 mg/dL (ref 8.0–10.3)
Chloride: 100 mmol/L (ref 98–108)
Creatinine: 0.9 mg/dL (ref 0.60–1.20)
Glucose, Bld: 129 mg/dL — ABNORMAL HIGH (ref 73–118)
Potassium: 3.4 mmol/L (ref 3.3–4.7)
Sodium: 135 mmol/L (ref 128–145)
Total Bilirubin: 0.6 mg/dL (ref 0.2–1.6)
Total Protein: 5.9 g/dL — ABNORMAL LOW (ref 6.4–8.1)

## 2017-08-21 MED ORDER — HEPARIN SOD (PORK) LOCK FLUSH 100 UNIT/ML IV SOLN
500.0000 [IU] | Freq: Once | INTRAVENOUS | Status: AC | PRN
  Administered 2017-08-21: 500 [IU]
  Filled 2017-08-21: qty 5

## 2017-08-21 MED ORDER — SODIUM CHLORIDE 0.9% FLUSH
10.0000 mL | INTRAVENOUS | Status: DC | PRN
  Administered 2017-08-21: 10 mL
  Filled 2017-08-21: qty 10

## 2017-08-21 MED ORDER — DRONABINOL 5 MG PO CAPS: 5.0000 mg | ORAL_CAPSULE | Freq: Two times a day (BID) | ORAL | 2 refills | Status: DC

## 2017-08-21 MED ORDER — LINACLOTIDE 145 MCG PO CAPS: 145.0000 ug | ORAL_CAPSULE | Freq: Every day | ORAL | 1 refills | Status: DC

## 2017-08-21 MED ORDER — SODIUM CHLORIDE 0.9 % IV SOLN
Freq: Once | INTRAVENOUS | Status: AC
Start: 2017-08-21 — End: 2017-08-21
  Administered 2017-08-21: 12:00:00 via INTRAVENOUS

## 2017-08-21 MED ORDER — LIDOCAINE-PRILOCAINE 2.5-2.5 % EX CREA: 1.0000 "application " | TOPICAL_CREAM | CUTANEOUS | 5 refills | Status: DC | PRN

## 2017-08-21 MED ORDER — SODIUM CHLORIDE 0.9 % IV SOLN
480.0000 mg | Freq: Once | INTRAVENOUS | Status: AC
  Administered 2017-08-21: 480 mg via INTRAVENOUS
  Filled 2017-08-21: qty 48

## 2017-08-21 NOTE — Progress Notes (Signed)
Hematology and Oncology Follow Up Visit  [name] [id number] 08-31-1945 72 y.o. 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 He is feeling quite fatigued and weak.  He has not been eating well and is seeing a wellness specialist who is helping him with nutrition. He is drinking 1 boost a day. His weight is down 3 lbs since his last visit. We will get him on Marinol BID and see if this helps.  He is doing his best to stay hydrated.  No lymphadenopathy noted on exam.  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 No fever, chills, n/v, cough, rash, dizziness, chest pain, palpitations, abdominal pain or changes in bowel or bladder habits.  He still has chronic constipation and would like to try a lower dose of Linzess (145 mcg PO daily). No episodes of bleeding, no bruising or petechiae.  The puffiness in his calves and ankles comes and goes. No tenderness, numbness or tingling in his extremities.  He uses a cane when ambulating for support. No falls or syncopal episodes.   ECOG Performance Status: 1 - Symptomatic but completely ambulatory  Medications:  Allergies as of 08/21/2017      Reactions   Cefepime Hives, Shortness Of Breath   Cephaeline Hives   Morphine And Related Shortness Of Breath, Nausea And Vomiting, Swelling, Other (See Comments)   Agitation, tolerates dilaudid Other reaction(s): Other (See Comments) Agitation, tolerates dilaudid   Penicillins Hives, Shortness Of Breath, Rash   Has patient had a PCN reaction causing immediate rash, facial/tongue/throat swelling, SOB or lightheadedness with hypotension:  Yes Has patient had a PCN reaction causing severe rash involving mucus membranes or skin necrosis: Yes Has patient had a PCN reaction that required hospitalization No Has patient had a PCN reaction occurring within the last 10 years: No If all of the above answers are "NO", then may proceed with Cephalosporin use.   Morphine    Oxycodone-acetaminophen    Doxycycline Rash      Medication List        Accurate as of 08/21/17  5:07 PM. Always use your most recent med list.          albuterol 108 (90 Base) MCG/ACT inhaler Commonly known as:  PROVENTIL HFA;VENTOLIN HFA Inhale 1-2 puffs into the lungs every 4 (four) hours as needed for wheezing or shortness of breath.   alfuzosin 10 MG 24 hr tablet Commonly known as:  UROXATRAL Take 10 mg by mouth at bedtime.   ALPRAZolam 0.5 MG tablet Commonly known as:  XANAX Take 1 tablet (0.5 mg total) by mouth 2 (two) times daily as needed. for anxiety   CIALIS 5 MG tablet Generic drug:  tadalafil Take 5 mg by mouth as needed.   clopidogrel 75 MG tablet Commonly known as:  PLAVIX TAKE 1 TABLET BY MOUTH EVERY DAY WITH BREAKFAST   COLCRYS 0.6 MG tablet Generic drug:  colchicine Take 1 tablet by mouth every evening.   dronabinol 5 MG capsule Commonly known as:  MARINOL Take 1 capsule (5 mg total) by mouth 2 (two) times daily before a meal.   FISH OIL OMEGA-3 1000 MG Caps Take 1,000  mg by mouth daily.   FLUoxetine 40 MG capsule Commonly known as:  PROZAC Take 1 capsule (40 mg total) by mouth 2 (two) times daily.   fluticasone 50 MCG/ACT nasal spray Commonly known as:  FLONASE INSTILL 2 SPRAYS IN EACH NOSTRIL EVERY DAY   gabapentin 300 MG capsule Commonly known as:  NEURONTIN Take 1 capsule (300 mg total) by mouth 2 (two) times daily.   HYDROmorphone 2 MG tablet Commonly known as:  DILAUDID Take 1 tablet (2 mg total) by mouth every 8 (eight) hours as needed for severe pain. Please fill on or after 08/06/17   lidocaine-prilocaine  cream Commonly known as:  EMLA Apply 1 application topically as needed.   linaclotide 145 MCG Caps capsule Commonly known as:  LINZESS Take 1 capsule (145 mcg total) by mouth daily before breakfast.   losartan-hydrochlorothiazide 50-12.5 MG tablet Commonly known as:  HYZAAR TAKE 1 TABLET BY MOUTH DAILY   Melatonin 5 MG Tabs Take 5 mg by mouth at bedtime. Takes 10 mg at HS   metoprolol tartrate 25 MG tablet Commonly known as:  LOPRESSOR Take 1 tablet (25 mg total) by mouth 2 (two) times daily.   mupirocin ointment 2 % Commonly known as:  BACTROBAN APP ON THE SKIN ONCE D   ondansetron 8 MG tablet Commonly known as:  ZOFRAN Take 1 tablet (8 mg total) by mouth every 8 (eight) hours as needed for nausea or vomiting.   pantoprazole 40 MG tablet Commonly known as:  PROTONIX Take 1 tablet (40 mg total) by mouth every evening.   pravastatin 20 MG tablet Commonly known as:  PRAVACHOL Take 1 tablet (20 mg total) by mouth daily.   predniSONE 5 MG tablet Commonly known as:  DELTASONE Take 5 mg by mouth daily with breakfast.   prochlorperazine 10 MG tablet Commonly known as:  COMPAZINE TAKE 1 TABLET(10 MG) BY MOUTH EVERY 6 HOURS AS NEEDED FOR NAUSEA OR VOMITING   traZODone 100 MG tablet Commonly known as:  DESYREL Take 1 tablet (100 mg total) by mouth at bedtime as needed for sleep.   vitamin B-12 1000 MCG tablet Commonly known as:  CYANOCOBALAMIN Take 1,000 mcg by mouth daily.   Vitamin D3 2000 units capsule Take 1 capsule (2,000 Units total) by mouth daily.       Allergies:  Allergies  Allergen Reactions  . Cefepime Hives and Shortness Of Breath  . Cephaeline Hives  . Morphine And Related Shortness Of Breath, Nausea And Vomiting, Swelling and Other (See Comments)    Agitation, tolerates dilaudid Other reaction(s): Other (See Comments) Agitation, tolerates dilaudid  . Penicillins Hives, Shortness Of Breath and Rash    Has patient had a PCN reaction causing  immediate rash, facial/tongue/throat swelling, SOB or lightheadedness with hypotension: Yes Has patient had a PCN reaction causing severe rash involving mucus membranes or skin necrosis: Yes Has patient had a PCN reaction that required hospitalization No Has patient had a PCN reaction occurring within the last 10 years: No If all of the above answers are "NO", then may proceed with Cephalosporin use.   [name] Morphine   . Oxycodone-Acetaminophen   . Doxycycline Rash    Past Medical History, Surgical history, Social history, and Family History were reviewed and updated.  Review of Systems: All other 10 point review of systems is negative.   Physical Exam:  weight is 141 lb (64 kg). His oral temperature is 97.5 F (36.4 C) (abnormal). His blood pressure is 112/60 and his  pulse is 60. His respiration is 18 and oxygen saturation is 93%.   Wt Readings from Last 3 Encounters:  08/21/17 141 lb (64 kg)  08/18/17 141 lb (64 kg)  07/24/17 144 lb (65.3 kg)    Ocular: Sclerae unicteric, pupils equal, round and reactive to light Ear-nose-throat: Oropharynx clear, dentition fair Lymphatic: No cervical, supraclavicular or axillary adenopathy Lungs no rales or rhonchi, good excursion bilaterally Heart regular rate and rhythm, no murmur appreciated Abd soft, nontender, positive bowel sounds, no liver or spleen tip palpated on exam, no fluid wave  MSK no focal spinal tenderness, no joint edema Neuro: non-focal, well-oriented, appropriate affect Breasts: Deferred   Lab Results  Component Value Date   WBC 5.4 08/21/2017   HGB 11.3 (L) 08/21/2017   HCT 34.4 (L) 08/21/2017   MCV 90.5 08/21/2017   PLT 251 08/21/2017   Lab Results  Component Value Date   FERRITIN 56 07/24/2017   IRON 28 (L) 07/24/2017   TIBC 180 (L) 07/24/2017   UIBC 152 07/24/2017   IRONPCTSAT 15 (L) 07/24/2017   Lab Results  Component Value Date   RETICCTPCT 1.9 03/24/2013   RBC 3.80 (L) 08/21/2017   No results found  for: KPAFRELGTCHN, LAMBDASER, KAPLAMBRATIO No results found for: IGGSERUM, IGA, IGMSERUM No results found for: Odetta Pink, SPEI   Chemistry      Component Value Date/Time   NA 135 08/21/2017 1045   NA 140 04/10/2017 1046   NA 135 (L) 03/06/2017 1104   K 3.4 08/21/2017 1045   K 3.7 04/10/2017 1046   K 4.2 03/06/2017 1104   CL 100 08/21/2017 1045   CL 98 04/10/2017 1046   CO2 30 08/21/2017 1045   CO2 31 04/10/2017 1046   CO2 30 (H) 03/06/2017 1104   BUN 11 08/21/2017 1045   BUN 21 04/10/2017 1046   BUN 20.0 03/06/2017 1104   CREATININE 0.90 08/21/2017 1045   CREATININE 0.9 04/10/2017 1046   CREATININE 1.0 03/06/2017 1104      Component Value Date/Time   CALCIUM 9.0 08/21/2017 1045   CALCIUM 8.9 04/10/2017 1046   CALCIUM 9.4 03/06/2017 1104   ALKPHOS 60 08/21/2017 1045   ALKPHOS 65 04/10/2017 1046   ALKPHOS 78 03/06/2017 1104   AST 16 08/21/2017 1045   AST 20 03/06/2017 1104   ALT 18 08/21/2017 1045   ALT 21 04/10/2017 1046   ALT 20 03/06/2017 1104   BILITOT 0.6 08/21/2017 1045   BILITOT 0.47 03/06/2017 1104      Impression and Plan: [name] is a very pleasant 72 yo caucasian gentleman with history of hemochromatosis, homozygous for C282Y mutation. He now has stage IIIc (T2N2M0) melanoma of the left thigh. This was excised in November 2018 and he is tolerating treatment with Opdivo well.  We will proceed with cycle 8 today as planned.  We will see what his iron studies show and bring him back in for infusion if needed.  We will have him try Marinol 5 mg PO BID to help boost his appetite and weight. A prescription for Linzess was also sent to his pharmacy.  We will plan to see him back in another month for follow-up.  They will contact our office with any questions or concerns. We can certainly see her sooner if need be.   Laverna Peace, NP 5/16/20195:07 PM

## 2017-08-21 NOTE — Patient Instructions (Signed)
[facility] Discharge Instructions for Patients Receiving Chemotherapy  Today you received the following chemotherapy agents Nivolumab To help prevent nausea and vomiting after your treatment, we encourage you to take your nausea medication as prescribed.   If you develop nausea and vomiting that is not controlled by your nausea medication, call the clinic.   BELOW ARE SYMPTOMS THAT SHOULD BE REPORTED IMMEDIATELY:  *FEVER GREATER THAN 100.5 F  *CHILLS WITH OR WITHOUT FEVER  NAUSEA AND VOMITING THAT IS NOT CONTROLLED WITH YOUR NAUSEA MEDICATION  *UNUSUAL SHORTNESS OF BREATH  *UNUSUAL BRUISING OR BLEEDING  TENDERNESS IN MOUTH AND THROAT WITH OR WITHOUT PRESENCE OF ULCERS  *URINARY PROBLEMS  *BOWEL PROBLEMS  UNUSUAL RASH Items with * indicate a potential emergency and should be followed up as soon as possible.  Feel free to call the clinic should you have any questions or concerns. The clinic phone number is [phone].  Please show the [facility] at check-in to the Emergency Department and triage nurse.

## 2017-08-22 LAB — IRON AND TIBC
Iron: 80 ug/dL (ref 42–163)
Saturation Ratios: 52 % (ref 42–163)
TIBC: 153 ug/dL — ABNORMAL LOW (ref 202–409)
UIBC: 73 ug/dL

## 2017-08-22 LAB — FERRITIN: Ferritin: 117 ng/mL (ref 22–316)

## 2017-08-22 LAB — TSH: TSH: 0.472 u[IU]/mL (ref 0.320–4.118)

## 2017-08-27 ENCOUNTER — Other Ambulatory Visit: Payer: Self-pay

## 2017-08-27 MED ORDER — METOPROLOL TARTRATE 25 MG PO TABS: 25.0000 mg | ORAL_TABLET | Freq: Two times a day (BID) | ORAL | 1 refills | Status: DC

## 2017-09-02 ENCOUNTER — Other Ambulatory Visit: Payer: Self-pay | Admitting: Internal Medicine

## 2017-09-08 ENCOUNTER — Other Ambulatory Visit: Payer: Self-pay | Admitting: Hematology & Oncology

## 2017-09-08 ENCOUNTER — Other Ambulatory Visit: Payer: Self-pay | Admitting: Internal Medicine

## 2017-09-08 NOTE — Progress Notes (Signed)
Patient ID: [name], male   DOB: 12/10/1945, 72 y.o.   MRN: [id number]    Cardiology Office Note    Date:  09/09/2017   ID:  [name], DOB [birth year], MRN [id number]  PCP:  Cassandria Anger, MD  Cardiologist:  Dr. Jenkins Rouge   Electrophysiologist:  n/a  No chief complaint on file.   History of Present Illness:     72 y.o. male with a hx of CAD, paroxysmal atrial fibrillation not on systemic anticoagulation given need for DAPT, hyperlipidemia, COPD, GERD, obstructive sleep apnea, rheumatoid arthritis, and hemochromatosis. He had left circumflex stent with spiral dissection in 2006. He had TEE DCCV in May 2012. He had h/o post lumbar fusion surgical wound infection with positive blood culture of pseudomonas and required 6 weeks of IV antibiotics near the end of 2014. He was admitted in March 2016 with chest tightness and dyspnea and underwent cardiac catheterization which showed new stenosis in proximal to mid LAD treated with overlapping drug-eluting stents. He was started on aspirin and Plavix. Sees ortho at [place] Dr. Pasty Spillers.   He has melanoma of left thigh excised NOvember 2018 bu Stage 3C Getting adjuvant Nivolumab every 4 weeks And phlebotomy to maintain ferritin less than 100    Past Medical History:  Diagnosis Date  . Alcoholism /alcohol abuse ([place])    per family  . Allergic rhinitis   . Anxiety   . Bacterial infection   . CAD (coronary artery disease)    minimal coronary plaque in the LAD and right coronary system. 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 7-06. Last nuclear stress 11-17-06 fixed anterior/ inferior defect, no inducible ischemia, EF 81%  . Chronic back pain    "all over back"  . Chronic neck pain   . Depression   . Diverticulosis   . Dyspnea    with exertion  . Dysrhythmia 01-24-12   past hx. A.Fib x1 episode-responded to med.  . Falls frequently    "since  02/2013" (06/16/2013)  . Family history of cancer   . Genetic testing 03/06/2017   Multi-Cancer panel (83 genes) @ [facility] - No pathogenic mutations detected  . GERD (gastroesophageal reflux disease)   . Hemochromatosis    dx'd 14 yrs ago last ferritin Aug 11, 08 52 (22-322), Fe 136 ("I had 250 phlebotomies for that")  . High cholesterol    hx  . Hx of colonic polyps   . Hx of colonoscopy   . Hypertension   . Iron deficiency anemia due to chronic blood loss 12/27/2016  . Malignant melanoma of knee, left ([place])   . Myocardial infarction [facility]) 2006   "related to catheterization"  . Narcotic abuse ([place])    per family  . Osteoarthritis   . PONV (postoperative nausea and vomiting)   . RA (rheumatoid arthritis) ([place])     Past Surgical History:  Procedure Laterality Date  . ABDOMINAL ADHESION SURGERY  ~ 1968  . ANKLE RECONSTRUCTION Right 6-09   [facility]  . APPENDECTOMY  ~ 1956  . ASPIRATION OF ABSCESS Left 03/27/2017   Procedure: ASPIRATION OF SEROMA;  Surgeon: Stark Klein, MD;  Location: [place];  Service: General;  Laterality: Left;  . BONE TUMOR RESECTION  ~ 1954   "taken off my mastoid"  . CARPAL TUNNEL RELEASE Right 1990's  . CATARACT EXTRACTION, BILATERAL Bilateral 01-24-12  . CORONARY ANGIOPLASTY WITH STENT PLACEMENT  2006   "  while repairing 1st stent, a second area tore and they had to place 2nd stent " ?LAD & CX  . CORONARY ANGIOPLASTY WITH STENT PLACEMENT  06/17/2014  . CYST EXCISION  "several OR's"   "backX 2, back of my neck, face, inside right bicept, chest, wrist"  . FOOT SURGERY Right 11-08   for removal of bone spurs-  . HAMMER TOE SURGERY Right 07/2012   "broke 4 hammertoes"   . HARDWARE REMOVAL  03/09/2012   Procedure: HARDWARE REMOVAL;  Surgeon: Nita Sells, MD;  Location: [place];  Service: Orthopedics;  Laterality: Left;  Hardware Removal from Left Shoulder  . HARVEST BONE GRAFT  02/06/2012   Procedure: HARVEST ILIAC  BONE GRAFT;  Surgeon: Nita Sells, MD;  Location: [facility];  Service: Orthopedics;;  bone marrow aspirqation   . INGUINAL HERNIA REPAIR Bilateral   . JOINT REPLACEMENT    . KNEE SURGERY Left ~ 2003   "6-12 months after uni knee removed synovial sack"  . LEFT HEART CATHETERIZATION WITH CORONARY ANGIOGRAM N/A 06/17/2014   PCI of diffuse severe stenosis in the proximal to mid LAD using overlapping drug-eluting stents.  . LUMBAR WOUND DEBRIDEMENT N/A 03/17/2013   Procedure: Incision and drainage of superficial lumbar wound;  Surgeon: Floyce Stakes, MD;  Location: [place] [facility];  Service: Neurosurgery;  Laterality: N/A;  Incision and drainage of superficial lumbar wound  . MECKEL DIVERTICULUM EXCISION  ~ 1956  . MELANOMA EXCISION WITH SENTINEL LYMPH NODE BIOPSY Left 02/12/2017   WIDE LOCAL EXCISION LEFT KNEE MELANOMA, ADVANCEMENT FLAP CLOSURE, AND SENTINEL LYMPH NODE MAPPING AND BIOPSY.  [name] MELANOMA EXCISION WITH SENTINEL LYMPH NODE BIOPSY Left 02/12/2017   Procedure: WIDE LOCAL EXCISION LEFT KNEE MELANOMA, ADVANCEMENT FLAP CLOSURE, AND SENTINEL LYMPH NODE MAPPING AND BIOPSY.;  Surgeon: Stark Klein, MD;  Location: [place];  Service: General;  Laterality: Left;  GENERAL AND LOCAL  . ORIF SHOULDER FRACTURE  02/06/2012   Procedure: OPEN REDUCTION INTERNAL FIXATION (ORIF) SHOULDER FRACTURE;  Surgeon: Nita Sells, MD;  Location: [facility];  Service: Orthopedics;  Laterality: Left;  ORIF of a Left Shoulder Fracture with  Iliac Crest Bone Graft aspiration   . PORTACATH PLACEMENT Left 03/27/2017   Procedure: INSERTION PORT-A-CATH;  Surgeon: Stark Klein, MD;  Location: [place];  Service: General;  Laterality: Left;  . POSTERIOR LUMBAR FUSION  12-10   L4-5 diskectomy w/ fusion, cage placement and rods; [place]  . POSTERIOR LUMBAR FUSION 4 LEVEL N/A 03/02/2013   Procedure: Lumbar One to Sacral One Posterior lumbar interbody fusion;  Surgeon: Floyce Stakes, MD;   Location: [place] [facility];  Service: Neurosurgery;  Laterality: N/A;  L1 to S1 Posterior lumbar interbody fusion  . REPLACEMENT UNICONDYLAR JOINT KNEE Left ~ 2003   "~ 6 months after total knee replaced"  . SHOULDER ARTHROSCOPY Left ~ 2004 X 2   "@ [facility]; left bone splinter in & had to clean it out"  . TOTAL ANKLE REPLACEMENT Right 2008   at [facility]  . TOTAL KNEE ARTHROPLASTY Bilateral 2002  . TOTAL SHOULDER REPLACEMENT Left 2006  . TOTAL SHOULDER REPLACEMENT Right ~ 2007   Dr. Marlou Sa    Current Outpatient Medications  Medication Sig Dispense Refill  . albuterol (PROVENTIL HFA;VENTOLIN HFA) 108 (90 Base) MCG/ACT inhaler Inhale 1-2 puffs into the lungs every 4 (four) hours as needed for wheezing or shortness of breath. 1 Inhaler 1  . alfuzosin (UROXATRAL) 10 MG 24 hr tablet Take  10 mg by mouth at bedtime.   0  . ALPRAZolam (XANAX) 0.5 MG tablet Take 1 tablet (0.5 mg total) by mouth 2 (two) times daily as needed. for anxiety 60 tablet 3  . Cholecalciferol (VITAMIN D3) 2000 UNITS capsule Take 1 capsule (2,000 Units total) by mouth daily. 100 capsule 3  . clopidogrel (PLAVIX) 75 MG tablet TAKE 1 TABLET BY MOUTH EVERY DAY WITH BREAKFAST 90 tablet 3  . COLCRYS 0.6 MG tablet Take 1 tablet by mouth every evening.   2  . dronabinol (MARINOL) 5 MG capsule Take 1 capsule (5 mg total) by mouth 2 (two) times daily before a meal. 60 capsule 2  . FLUoxetine (PROZAC) 40 MG capsule TAKE 1 CAPSULE BY MOUTH TWICE DAILY 180 capsule 0  . fluticasone (FLONASE) 50 MCG/ACT nasal spray INSTILL 2 SPRAYS IN EACH NOSTRIL EVERY DAY 16 g 11  . gabapentin (NEURONTIN) 300 MG capsule Take 1 capsule (300 mg total) by mouth 2 (two) times daily. 180 capsule 1  . HYDROmorphone (DILAUDID) 2 MG tablet Take 1 tablet (2 mg total) by mouth every 8 (eight) hours as needed for severe pain. Please fill on or after 08/06/17 120 tablet 0  . lidocaine-prilocaine (EMLA) cream Apply 1 application topically as needed. 30 g 5  . linaclotide (LINZESS)  145 MCG CAPS capsule Take 1 capsule (145 mcg total) by mouth daily before breakfast. 30 capsule 1  . losartan-hydrochlorothiazide (HYZAAR) 50-12.5 MG tablet TAKE 1 TABLET BY MOUTH DAILY 90 tablet 1  . Melatonin 5 MG TABS Take 5 mg by mouth at bedtime. Takes 10 mg at HS    . metoprolol tartrate (LOPRESSOR) 25 MG tablet Take 1 tablet (25 mg total) by mouth 2 (two) times daily. 180 tablet 1  . mupirocin ointment (BACTROBAN) 2 % APP ON THE SKIN ONCE D  0  . Omega-3 Fatty Acids (FISH OIL OMEGA-3) 1000 MG CAPS Take 1,000 mg by mouth daily.     . ondansetron (ZOFRAN) 8 MG tablet Take 1 tablet (8 mg total) by mouth every 8 (eight) hours as needed for nausea or vomiting. 30 tablet 1  . pantoprazole (PROTONIX) 40 MG tablet Take 1 tablet (40 mg total) by mouth every evening. 90 tablet 1  . pravastatin (PRAVACHOL) 20 MG tablet Take 1 tablet (20 mg total) by mouth daily. (Patient taking differently: Take 20 mg by mouth every evening. ) 90 tablet 1  . predniSONE (DELTASONE) 5 MG tablet Take 5 mg by mouth daily with breakfast.    . prochlorperazine (COMPAZINE) 10 MG tablet TAKE 1 TABLET(10 MG) BY MOUTH EVERY 6 HOURS AS NEEDED FOR NAUSEA OR VOMITING 385 tablet 1  . tadalafil (CIALIS) 5 MG tablet Take 5 mg by mouth as needed.    . traZODone (DESYREL) 100 MG tablet TAKE 1 TABLET(100 MG) BY MOUTH AT BEDTIME AS NEEDED FOR SLEEP 30 tablet 0  . vitamin B-12 (CYANOCOBALAMIN) 1000 MCG tablet Take 1,000 mcg by mouth daily.      No current facility-administered medications for this visit.     Allergies:   Cefepime; Cephaeline; Morphine and related; Penicillins; Morphine; Oxycodone-acetaminophen; and Doxycycline   Social History   Socioeconomic History  . Marital status: Significant Other    Spouse name: Not on file  . Number of children: 2  . Years of education: 28  . Highest education level: Not on file  Occupational History  . 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    Comment: self employed  Social Needs  . Financial resource strain: Not hard at all  . Food insecurity:    Worry: Never true    Inability: Never true  . Transportation needs:    Medical: No    Non-medical: No  Tobacco Use  . Smoking status: Never Smoker  . Smokeless tobacco: Never Used  Substance and Sexual Activity  . Alcohol use: No    Alcohol/week: 0.0 oz    Comment: "I do not drink anymore"  . Drug use: No  . Sexual activity: Yes    Partners: Female  Lifestyle  . Physical activity:    Days per week: 0 days    Minutes per session: 0 min  . Stress: Very much  Relationships  . Social connections:    Talks on phone: More than three times a week    Gets together: More than three times a week    Attends religious service: More than 4 times per year    Active member of club or organization: Yes    Attends meetings of clubs or organizations: More than 4 times per year    Relationship status: Living with partner  Other Topics Concern  . Not on file  Social History Narrative   [employer] [place]. Married 1972-32yr seperated/divorced. Engaged April '11. 1 son '74; step-daughter '70. 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 During '11 discovered an [employer]- $500,000+ loss. Is handling this OK - will keep the business running.     Family History:  The patient's family history includes Cancer in his maternal grandmother; Cancer - Lung in his mother; Cancer - Prostate (age of onset: 71 in his father; Colon polyps in his father; Coronary artery disease in his father and maternal aunt; Heart attack in his brother and maternal aunt; Hemochromatosis in his sister; Hyperlipidemia in his brother; Hypertension in his father; Lung cancer in his mother; Macular degeneration in his mother; Melanoma in his mother; Other in his brother and mother; Prostate cancer in his father; Rheum arthritis in his  sister; Thyroid disease in his sister; Uterine cancer in his mother.   ROS:   Please see the history of present illness.    ROS All other systems reviewed and are negative.   PHYSICAL EXAM:   VS:  BP 126/80   Pulse 71   Ht 5' 6"  (1.676 m)   Wt 146 lb 8 oz (66.5 kg)   SpO2 93%   BMI 23.65 kg/m    Affect appropriate Healthy:  appears stated age H58 normal Neck supple with no adenopathy JVP normal no bruits no thyromegaly Lungs clear with no wheezing and good diaphragmatic motion Heart:  S1/S2 no murmur, no rub, gallop or click PMI normal Abdomen: benighn, BS positve, no tenderness, no AAA no bruit.  No HSM or HJR Distal pulses intact with no bruits No edema Neuro non-focal Skin warm and dry No muscular weakness Eschar over medial aspect of left knee from skin cancer   Wt Readings from Last 3 Encounters:  09/09/17 146 lb 8 oz (66.5 kg)  08/21/17 141 lb (64 kg)  08/18/17 141 lb (64 kg)      Studies/Labs Reviewed:   EKG:  03/28/15   It showed NSR rate 68  Normal  01/29/17 SR rate 60 [id number]   Recent Labs: 08/21/2017: ALT 18; BUN 11; Creatinine 0.90; Hemoglobin 11.3; Platelet Count 251; Potassium 3.4; Sodium 135; TSH  0.472   Recent Lipid Panel    Component Value Date/Time   CHOL 128 03/10/2015 1008   TRIG 95.0 03/10/2015 1008   HDL 39.30 03/10/2015 1008   CHOLHDL 3 03/10/2015 1008   VLDL 19.0 03/10/2015 1008   Canadian 70 03/10/2015 1008    Additional studies/ records that were reviewed today include:   LHC 06/17/14 Coronary angiography: Coronary dominance: right Left mainstem: Normal Left anterior descending (LAD): 80% proximal lesion before D1 50% just distal to D1 80% distal D1 30% multiple discrete Left circumflex (LCx): Primarily AV groove and OM1 Widely patent stents in OM Right coronary artery (RCA): Dominant 30% proximal 50% Mid vessel stenosis  Left ventriculography: Left ventricular systolic function is normal, LVEF is estimated at  55-65%, there is no significant mitral regurgitation  Final Conclusions: New LAD lesions patent stents in OM Recommendations: Reviewed with Dr Burt Knack PCI/Stent mulitple sights in LAD  Conclusions: Successful PCI of diffuse severe stenosis in the proximal to mid LAD using overlapping drug-eluting stent platforms  Recommendations: Dual antiplatelet therapy with aspirin and Plavix for at least 12 months.   Lesion Data: Vessel: LAD/proximal to mid Percent stenosis (pre): 80 TIMI-flow (pre): 3 Stent: 2.5 x 24 mm Promus DES and 3.0 x 20 mm Promus DES (overlapping) Percent stenosis (post): 0 TIMI-flow (post): 3  Myoview 05/20/14 Low risk stress nuclear study Poor quality scan with marked gut uptake and diaphragmatic motion on RAW images. Cannot r/o inferolateral wall ischemia.  LV Ejection Fraction: 63%. LV Wall Motion: NL LV Function; NL Wall Motion   ASSESSMENT:    CAD   PLAN:  In order of problems listed above:  1. CAD s/p overlapping DESx2 to LAD on 06/17/2014 Previous stenting circumflex 2006 with spiral dissection : no angina, Continue plavix Since not taking ASA P2Y has been good in past   2. COPD: stable no active wheezing   3. Paroxysmal atrial fibrillation not on systemic anticoagulation given need for DAPT  s/p TEE DCCV in May 2012, previously on pradaxa, off pradaxa now given need for Plavix  4. Hyperlipidemia: LFT and lipid panel stable on 03/10/2015. Continue pravachol  5. OSA:  Wearing CPAP   6. Rheumatoid arthritis continue pain meds Inactivity has not helped    7. Melanoma:  F/u oncology getting adjuvant Rx stage 3C PET scan 02/2017 was stable   Jenkins Rouge

## 2017-09-08 NOTE — Telephone Encounter (Signed)
Copied from [place] [phone]. Topic: Quick Communication - Rx Refill/Question >> Sep 08, 2017  1:55 PM Robina Ade, Helene Kelp D wrote: Medication: traZODone (DESYREL) 100 MG tablet,ALPRAZolam (XANAX) 0.5 MG tablet,HYDROmorphone (DILAUDID) 2 MG tablet  Has the patient contacted their pharmacy? Yes (Agent: If no, request that the patient contact the pharmacy for the refill.) (Agent: If yes, when and what did the pharmacy advise?)  Preferred Pharmacy (with phone number or street name)[id number] - [place], [address] AT [address]  Agent: Please be advised that RX refills may take up to 3 business days. We ask that you follow-up with your pharmacy.

## 2017-09-09 ENCOUNTER — Encounter: Payer: Self-pay | Admitting: Cardiovascular Disease

## 2017-09-09 ENCOUNTER — Ambulatory Visit (INDEPENDENT_AMBULATORY_CARE_PROVIDER_SITE_OTHER): Payer: Medicare Other | Admitting: Cardiovascular Disease

## 2017-09-09 VITALS — BP 126/80 | HR 71 | Ht 66.0 in | Wt 146.5 lb

## 2017-09-09 DIAGNOSIS — I2510 Atherosclerotic heart disease of native coronary artery without angina pectoris: Secondary | ICD-10-CM

## 2017-09-09 DIAGNOSIS — E785 Hyperlipidemia, unspecified: Secondary | ICD-10-CM

## 2017-09-09 DIAGNOSIS — I480 Paroxysmal atrial fibrillation: Secondary | ICD-10-CM

## 2017-09-09 DIAGNOSIS — I10 Essential (primary) hypertension: Secondary | ICD-10-CM

## 2017-09-09 NOTE — Patient Instructions (Signed)

## 2017-09-10 NOTE — Telephone Encounter (Signed)
Rx refill request: hydromorphone 2 mg   Last filled: 07/21/17  # 120                            Alprazolam 0.5 mg        Last filled: 07/21/17  # 120  Trazodone filled by [facility]   09/08/17  #30 0 RF  LOV: 07/21/17  PCP: [place]  Pharmacy: verified

## 2017-09-11 MED ORDER — ALPRAZOLAM 0.5 MG PO TABS: 0.5000 mg | ORAL_TABLET | Freq: Two times a day (BID) | ORAL | 3 refills | Status: DC | PRN

## 2017-09-11 MED ORDER — HYDROMORPHONE HCL 2 MG PO TABS
2.0000 mg | ORAL_TABLET | Freq: Three times a day (TID) | ORAL | 0 refills | Status: DC | PRN
Start: 2017-09-11 — End: 2017-09-30

## 2017-09-18 ENCOUNTER — Encounter: Payer: Self-pay | Admitting: Hematology & Oncology

## 2017-09-18 ENCOUNTER — Inpatient Hospital Stay: Payer: Medicare Other

## 2017-09-18 ENCOUNTER — Other Ambulatory Visit: Payer: Self-pay

## 2017-09-18 ENCOUNTER — Inpatient Hospital Stay: Payer: Medicare Other | Attending: Hematology & Oncology | Admitting: Hematology & Oncology

## 2017-09-18 VITALS — BP 108/70 | HR 93 | Temp 97.5°F | Resp 18 | Wt 138.0 lb

## 2017-09-18 DIAGNOSIS — D509 Iron deficiency anemia, unspecified: Secondary | ICD-10-CM | POA: Diagnosis not present

## 2017-09-18 DIAGNOSIS — Z79899 Other long term (current) drug therapy: Secondary | ICD-10-CM | POA: Insufficient documentation

## 2017-09-18 DIAGNOSIS — D500 Iron deficiency anemia secondary to blood loss (chronic): Secondary | ICD-10-CM

## 2017-09-18 DIAGNOSIS — K5909 Other constipation: Secondary | ICD-10-CM

## 2017-09-18 DIAGNOSIS — C4372 Malignant melanoma of left lower limb, including hip: Secondary | ICD-10-CM

## 2017-09-18 DIAGNOSIS — R630 Anorexia: Secondary | ICD-10-CM

## 2017-09-18 DIAGNOSIS — R634 Abnormal weight loss: Secondary | ICD-10-CM

## 2017-09-18 LAB — CBC WITH DIFFERENTIAL (CANCER CENTER ONLY)
Basophils Absolute: 0 10*3/uL (ref 0.0–0.1)
Basophils Relative: 1 %
Eosinophils Absolute: 0 10*3/uL (ref 0.0–0.5)
Eosinophils Relative: 0 %
HCT: 37.9 % — ABNORMAL LOW (ref 38.7–49.9)
Hemoglobin: 12.3 g/dL — ABNORMAL LOW (ref 13.0–17.1)
Lymphocytes Relative: 19 %
Lymphs Abs: 1 10*3/uL (ref 0.9–3.3)
MCH: 30.2 pg (ref 28.0–33.4)
MCHC: 32.5 g/dL (ref 32.0–35.9)
MCV: 93.1 fL (ref 82.0–98.0)
Monocytes Absolute: 0.8 10*3/uL (ref 0.1–0.9)
Monocytes Relative: 14 %
Neutro Abs: 3.6 10*3/uL (ref 1.5–6.5)
Neutrophils Relative %: 66 %
Platelet Count: 203 10*3/uL (ref 145–400)
RBC: 4.07 MIL/uL — ABNORMAL LOW (ref 4.20–5.70)
RDW: 15.1 % (ref 11.1–15.7)
WBC Count: 5.5 10*3/uL (ref 4.0–10.0)

## 2017-09-18 LAB — CMP (CANCER CENTER ONLY)
ALT: 16 U/L (ref 10–47)
AST: 17 U/L (ref 11–38)
Albumin: 3.4 g/dL — ABNORMAL LOW (ref 3.5–5.0)
Alkaline Phosphatase: 61 U/L (ref 26–84)
Anion gap: 9 (ref 5–15)
BUN: 10 mg/dL (ref 7–22)
CO2: 30 mmol/L (ref 18–33)
Calcium: 8.7 mg/dL (ref 8.0–10.3)
Chloride: 100 mmol/L (ref 98–108)
Creatinine: 1.3 mg/dL — ABNORMAL HIGH (ref 0.60–1.20)
Glucose, Bld: 139 mg/dL — ABNORMAL HIGH (ref 73–118)
Potassium: 3.1 mmol/L — ABNORMAL LOW (ref 3.3–4.7)
Sodium: 139 mmol/L (ref 128–145)
Total Bilirubin: 0.8 mg/dL (ref 0.2–1.6)
Total Protein: 6.2 g/dL — ABNORMAL LOW (ref 6.4–8.1)

## 2017-09-18 MED ORDER — SODIUM CHLORIDE 0.9% FLUSH
10.0000 mL | INTRAVENOUS | Status: DC | PRN
  Administered 2017-09-18: 10 mL via INTRAVENOUS
  Filled 2017-09-18: qty 10

## 2017-09-18 MED ORDER — HEPARIN SOD (PORK) LOCK FLUSH 100 UNIT/ML IV SOLN
500.0000 [IU] | Freq: Once | INTRAVENOUS | Status: AC
  Administered 2017-09-18: 500 [IU] via INTRAVENOUS
  Filled 2017-09-18: qty 5

## 2017-09-18 MED ORDER — SODIUM CHLORIDE 0.9 % IV SOLN
INTRAVENOUS | Status: AC
  Administered 2017-09-18: 11:00:00 via INTRAVENOUS

## 2017-09-18 NOTE — Progress Notes (Signed)
Hematology and Oncology Follow Up Visit  [name] [id number] 04-02-1946 72 y.o. 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 Stankovich is here today with with his wife for follow-up.  His wife recently had surgery on her left foot.  She had a fusion of her foot.  He is having problems with diarrhea.  He is losing weight.  He does not have much of an appetite.  I think we have to hold his nivolumab at this time.  I just do not think that he should be getting this.  He has had no pain.  He has had no cough.  He has had no fever.  He has had no bleeding.  I do not think it would be a bad idea to get some scans on him to make sure nothing else is going on.  His iron studies done back in May showed a ferritin of 117 with iron saturation of 52%.  He does have hemochromatosis.  We will have to watch this.  I do not think we need to be phlebotomizing him right now.  He has had no headache.  There is been no dysphasia or odynophagia.  He has had no rashes.  Overall, his performance status is ECOG 2.  Medications:  Allergies as of 09/18/2017      Reactions   Cefepime Hives, Shortness Of Breath   Cephaeline Hives   Morphine And Related Shortness Of Breath, Nausea And Vomiting, Swelling, Other (See Comments)   Agitation, tolerates dilaudid Other reaction(s): Other (See Comments) Agitation, tolerates dilaudid   Penicillins Hives, Shortness Of Breath, Rash   Has patient had a PCN reaction causing immediate rash, facial/tongue/throat swelling, SOB or lightheadedness with hypotension: Yes Has patient had a PCN reaction causing severe rash involving mucus membranes or skin necrosis:  Yes Has patient had a PCN reaction that required hospitalization No Has patient had a PCN reaction occurring within the last 10 years: No If all of the above answers are "NO", then may proceed with Cephalosporin use.   Morphine    Oxycodone-acetaminophen    Doxycycline Rash      Medication List        Accurate as of 09/18/17 10:22 AM. Always use your most recent med list.          albuterol 108 (90 Base) MCG/ACT inhaler Commonly known as:  PROVENTIL HFA;VENTOLIN HFA Inhale 1-2 puffs into the lungs every 4 (four) hours as needed for wheezing or shortness of breath.   alfuzosin 10 MG 24 hr tablet Commonly known as:  UROXATRAL Take 10 mg by mouth at bedtime.   ALPRAZolam 0.5 MG tablet Commonly known as:  XANAX Take 1 tablet (0.5 mg total) by mouth 2 (two) times daily as needed. for anxiety   CIALIS 5 MG tablet Generic drug:  tadalafil Take 5 mg by mouth as needed.   clopidogrel 75 MG tablet Commonly known as:  PLAVIX TAKE 1 TABLET BY MOUTH EVERY DAY WITH BREAKFAST   COLCRYS 0.6 MG tablet Generic drug:  colchicine Take 1 tablet by mouth every evening.   dronabinol 5 MG capsule Commonly known as:  MARINOL Take 1 capsule (5 mg total) by mouth 2 (two) times daily before  a meal.   FISH OIL OMEGA-3 1000 MG Caps Take 1,000 mg by mouth daily.   FLUoxetine 40 MG capsule Commonly known as:  PROZAC TAKE 1 CAPSULE BY MOUTH TWICE DAILY   fluticasone 50 MCG/ACT nasal spray Commonly known as:  FLONASE INSTILL 2 SPRAYS IN EACH NOSTRIL EVERY DAY   gabapentin 300 MG capsule Commonly known as:  NEURONTIN Take 1 capsule (300 mg total) by mouth 2 (two) times daily.   HYDROmorphone 2 MG tablet Commonly known as:  DILAUDID Take 1 tablet (2 mg total) by mouth every 8 (eight) hours as needed for severe pain. Please fill on or after 09/06/17   lidocaine-prilocaine cream Commonly known as:  EMLA Apply 1 application topically as needed.   linaclotide 145 MCG Caps capsule Commonly  known as:  LINZESS Take 1 capsule (145 mcg total) by mouth daily before breakfast.   losartan-hydrochlorothiazide 50-12.5 MG tablet Commonly known as:  HYZAAR TAKE 1 TABLET BY MOUTH DAILY   Melatonin 5 MG Tabs Take 5 mg by mouth at bedtime. Takes 10 mg at HS   metoprolol tartrate 25 MG tablet Commonly known as:  LOPRESSOR Take 1 tablet (25 mg total) by mouth 2 (two) times daily.   mupirocin ointment 2 % Commonly known as:  BACTROBAN APP ON THE SKIN ONCE D   ondansetron 8 MG tablet Commonly known as:  ZOFRAN Take 1 tablet (8 mg total) by mouth every 8 (eight) hours as needed for nausea or vomiting.   pantoprazole 40 MG tablet Commonly known as:  PROTONIX Take 1 tablet (40 mg total) by mouth every evening.   pravastatin 20 MG tablet Commonly known as:  PRAVACHOL Take 1 tablet (20 mg total) by mouth daily.   predniSONE 5 MG tablet Commonly known as:  DELTASONE Take 5 mg by mouth daily with breakfast.   prochlorperazine 10 MG tablet Commonly known as:  COMPAZINE TAKE 1 TABLET(10 MG) BY MOUTH EVERY 6 HOURS AS NEEDED FOR NAUSEA OR VOMITING   traZODone 100 MG tablet Commonly known as:  DESYREL TAKE 1 TABLET(100 MG) BY MOUTH AT BEDTIME AS NEEDED FOR SLEEP   vitamin B-12 1000 MCG tablet Commonly known as:  CYANOCOBALAMIN Take 1,000 mcg by mouth daily.   Vitamin D3 2000 units capsule Take 1 capsule (2,000 Units total) by mouth daily.       Allergies:  Allergies  Allergen Reactions  . Cefepime Hives and Shortness Of Breath  . Cephaeline Hives  . Morphine And Related Shortness Of Breath, Nausea And Vomiting, Swelling and Other (See Comments)    Agitation, tolerates dilaudid Other reaction(s): Other (See Comments) Agitation, tolerates dilaudid  . Penicillins Hives, Shortness Of Breath and Rash    Has patient had a PCN reaction causing immediate rash, facial/tongue/throat swelling, SOB or lightheadedness with hypotension: Yes Has patient had a PCN reaction causing  severe rash involving mucus membranes or skin necrosis: Yes Has patient had a PCN reaction that required hospitalization No Has patient had a PCN reaction occurring within the last 10 years: No If all of the above answers are "NO", then may proceed with Cephalosporin use.   [name] Morphine   . Oxycodone-Acetaminophen   . Doxycycline Rash    Past Medical History, Surgical history, Social history, and Family History were reviewed and updated.  Review of Systems: Review of Systems  Constitutional: Positive for malaise/fatigue and weight loss.  HENT: Negative.   Eyes: Negative.   Respiratory: Negative.   Cardiovascular: Negative.   Gastrointestinal: Positive for  diarrhea and nausea.  Genitourinary: Negative.   Musculoskeletal: Positive for joint pain and myalgias.  Skin: Negative.   Neurological: Negative.   Endo/Heme/Allergies: Negative.   Psychiatric/Behavioral: Negative.      Physical Exam:  vitals were not taken for this visit.   Wt Readings from Last 3 Encounters:  09/09/17 146 lb 8 oz (66.5 kg)  08/21/17 141 lb (64 kg)  08/18/17 141 lb (64 kg)    Physical Exam  Constitutional: He is oriented to person, place, and time.  HENT:  Head: Normocephalic and atraumatic.  Mouth/Throat: Oropharynx is clear and moist.  Eyes: Pupils are equal, round, and reactive to light. EOM are normal.  Neck: Normal range of motion.  Cardiovascular: Normal rate, regular rhythm and normal heart sounds.  Pulmonary/Chest: Effort normal and breath sounds normal.  Abdominal: Soft. Bowel sounds are normal.  Musculoskeletal: Normal range of motion. He exhibits no edema, tenderness or deformity.  Lymphadenopathy:    He has no cervical adenopathy.  Neurological: He is alert and oriented to person, place, and time.  Skin: Skin is warm and dry. No rash noted. No erythema.  Psychiatric: He has a normal mood and affect. His behavior is normal. Judgment and thought content normal.  Vitals  reviewed.    Lab Results  Component Value Date   WBC 5.5 09/18/2017   HGB 12.3 (L) 09/18/2017   HCT 37.9 (L) 09/18/2017   MCV 93.1 09/18/2017   PLT 203 09/18/2017   Lab Results  Component Value Date   FERRITIN 117 08/21/2017   IRON 80 08/21/2017   TIBC 153 (L) 08/21/2017   UIBC 73 08/21/2017   IRONPCTSAT 52 08/21/2017   Lab Results  Component Value Date   RETICCTPCT 1.9 03/24/2013   RBC 4.07 (L) 09/18/2017   No results found for: KPAFRELGTCHN, LAMBDASER, KAPLAMBRATIO No results found for: IGGSERUM, IGA, IGMSERUM No results found for: Odetta Pink, SPEI   Chemistry      Component Value Date/Time   NA 135 08/21/2017 1045   NA 140 04/10/2017 1046   NA 135 (L) 03/06/2017 1104   K 3.4 08/21/2017 1045   K 3.7 04/10/2017 1046   K 4.2 03/06/2017 1104   CL 100 08/21/2017 1045   CL 98 04/10/2017 1046   CO2 30 08/21/2017 1045   CO2 31 04/10/2017 1046   CO2 30 (H) 03/06/2017 1104   BUN 11 08/21/2017 1045   BUN 21 04/10/2017 1046   BUN 20.0 03/06/2017 1104   CREATININE 0.90 08/21/2017 1045   CREATININE 0.9 04/10/2017 1046   CREATININE 1.0 03/06/2017 1104      Component Value Date/Time   CALCIUM 9.0 08/21/2017 1045   CALCIUM 8.9 04/10/2017 1046   CALCIUM 9.4 03/06/2017 1104   ALKPHOS 60 08/21/2017 1045   ALKPHOS 65 04/10/2017 1046   ALKPHOS 78 03/06/2017 1104   AST 16 08/21/2017 1045   AST 20 03/06/2017 1104   ALT 18 08/21/2017 1045   ALT 21 04/10/2017 1046   ALT 20 03/06/2017 1104   BILITOT 0.6 08/21/2017 1045   BILITOT 0.47 03/06/2017 1104      Impression and Plan: Mr. Llera is a very pleasant 72 yo caucasian gentleman with history of hemochromatosis --  homozygous for C282Y mutation.  This, for right now, is a secondary issue.  However, we do had to be cautious with his iron levels.  He now has stage IIIc (T2N2M0) melanoma of the left thigh. This was excised in November  2018.  Again, we will hold the  [place] today.  I will give him some IV fluids.  I think this is helpful.  We will plan to get him back in 1 month.  Again, at this break should help him out.   [name], MD 6/13/201910:22 AM

## 2017-09-18 NOTE — Patient Instructions (Signed)
Implanted Port Home Guide An implanted port is a type of central line that is placed under the skin. Central lines are used to provide IV access when treatment or nutrition needs to be given through a person's veins. Implanted ports are used for long-term IV access. An implanted port may be placed because:  You need IV medicine that would be irritating to the small veins in your hands or arms.  You need long-term IV medicines, such as antibiotics.  You need IV nutrition for a long period.  You need frequent blood draws for lab tests.  You need dialysis.  Implanted ports are usually placed in the chest area, but they can also be placed in the upper arm, the abdomen, or the leg. An implanted port has two main parts:  Reservoir. The reservoir is round and will appear as a small, raised area under your skin. The reservoir is the part where a needle is inserted to give medicines or draw blood.  Catheter. The catheter is a thin, flexible tube that extends from the reservoir. The catheter is placed into a large vein. Medicine that is inserted into the reservoir goes into the catheter and then into the vein.  How will I care for my incision site? Do not get the incision site wet. Bathe or shower as directed by your health care provider. How is my port accessed? Special steps must be taken to access the port:  Before the port is accessed, a numbing cream can be placed on the skin. This helps numb the skin over the port site.  Your health care provider uses a sterile technique to access the port. ? Your health care provider must put on a mask and sterile gloves. ? The skin over your port is cleaned carefully with an antiseptic and allowed to dry. ? The port is gently pinched between sterile gloves, and a needle is inserted into the port.  Only "non-coring" port needles should be used to access the port. Once the port is accessed, a blood return should be checked. This helps ensure that the port  is in the vein and is not clogged.  If your port needs to remain accessed for a constant infusion, a clear (transparent) bandage will be placed over the needle site. The bandage and needle will need to be changed every week, or as directed by your health care provider.  Keep the bandage covering the needle clean and dry. Do not get it wet. Follow your health care provider's instructions on how to take a shower or bath while the port is accessed.  If your port does not need to stay accessed, no bandage is needed over the port.  What is flushing? Flushing helps keep the port from getting clogged. Follow your health care provider's instructions on how and when to flush the port. Ports are usually flushed with saline solution or a medicine called heparin. The need for flushing will depend on how the port is used.  If the port is used for intermittent medicines or blood draws, the port will need to be flushed: ? After medicines have been given. ? After blood has been drawn. ? As part of routine maintenance.  If a constant infusion is running, the port may not need to be flushed.  How long will my port stay implanted? The port can stay in for as long as your health care provider thinks it is needed. When it is time for the port to come out, surgery will be   done to remove it. The procedure is similar to the one performed when the port was put in. When should I seek immediate medical care? When you have an implanted port, you should seek immediate medical care if:  You notice a bad smell coming from the incision site.  You have swelling, redness, or drainage at the incision site.  You have more swelling or pain at the port site or the surrounding area.  You have a fever that is not controlled with medicine.  This information is not intended to replace advice given to you by your health care provider. Make sure you discuss any questions you have with your health care provider. Document  Released: 03/25/2005 Document Revised: 08/31/2015 Document Reviewed: 11/30/2012 Elsevier Interactive Patient Education  2017 [employer]  

## 2017-09-18 NOTE — Patient Instructions (Signed)
Dehydration, Adult Dehydration is when there is not enough fluid or water in your body. This happens when you lose more fluids than you take in. Dehydration can range from mild to very bad. It should be treated right away to keep it from getting very bad. Symptoms of mild dehydration may include:  Thirst.  Dry lips.  Slightly dry mouth.  Dry, warm skin.  Dizziness. Symptoms of moderate dehydration may include:  Very dry mouth.  Muscle cramps.  Dark pee (urine). Pee may be the color of tea.  Your body making less pee.  Your eyes making fewer tears.  Heartbeat that is uneven or faster than normal (palpitations).  Headache.  Light-headedness, especially when you stand up from sitting.  Fainting (syncope). Symptoms of very bad dehydration may include:  Changes in skin, such as: ? Cold and clammy skin. ? Blotchy (mottled) or pale skin. ? Skin that does not quickly return to normal after being lightly pinched and let go (poor skin turgor).  Changes in body fluids, such as: ? Feeling very thirsty. ? Your eyes making fewer tears. ? Not sweating when body temperature is high, such as in hot weather. ? Your body making very little pee.  Changes in vital signs, such as: ? Weak pulse. ? Pulse that is more than 100 beats a minute when you are sitting still. ? Fast breathing. ? Low blood pressure.  Other changes, such as: ? Sunken eyes. ? Cold hands and feet. ? Confusion. ? Lack of energy (lethargy). ? Trouble waking up from sleep. ? Short-term weight loss. ? Unconsciousness. Follow these instructions at home:  If told by your doctor, drink an ORS: ? Make an ORS by using instructions on the package. ? Start by drinking small amounts, about  cup (120 mL) every 5-10 minutes. ? Slowly drink more until you have had the amount that your doctor said to have.  Drink enough clear fluid to keep your pee clear or pale yellow. If you were told to drink an ORS, finish the ORS  first, then start slowly drinking clear fluids. Drink fluids such as: ? Water. Do not drink only water by itself. Doing that can make the salt (sodium) level in your body get too low (hyponatremia). ? Ice chips. ? Fruit juice that you have added water to (diluted). ? Low-calorie sports drinks.  Avoid: ? Alcohol. ? Drinks that have a lot of sugar. These include high-calorie sports drinks, fruit juice that does not have water added, and soda. ? Caffeine. ? Foods that are greasy or have a lot of fat or sugar.  Take over-the-counter and prescription medicines only as told by your doctor.  Do not take salt tablets. Doing that can make the salt level in your body get too high (hypernatremia).  Eat foods that have minerals (electrolytes). Examples include bananas, oranges, potatoes, tomatoes, and spinach.  Keep all follow-up visits as told by your doctor. This is important. Contact a doctor if:  You have belly (abdominal) pain that: ? Gets worse. ? Stays in one area (localizes).  You have a rash.  You have a stiff neck.  You get angry or annoyed more easily than normal (irritability).  You are more sleepy than normal.  You have a harder time waking up than normal.  You feel: ? Weak. ? Dizzy. ? Very thirsty.  You have peed (urinated) only a small amount of very dark pee during 6-8 hours. Get help right away if:  You have symptoms of   very bad dehydration.  You cannot drink fluids without throwing up (vomiting).  Your symptoms get worse with treatment.  You have a fever.  You have a very bad headache.  You are throwing up or having watery poop (diarrhea) and it: ? Gets worse. ? Does not go away.  You have blood or something green (bile) in your throw-up.  You have blood in your poop (stool). This may cause poop to look black and tarry.  You have not peed in 6-8 hours.  You pass out (faint).  Your heart rate when you are sitting still is more than 100 beats a  minute.  You have trouble breathing. This information is not intended to replace advice given to you by your health care provider. Make sure you discuss any questions you have with your health care provider. Document Released: 01/19/2009 Document Revised: 10/13/2015 Document Reviewed: 05/19/2015 Elsevier Interactive Patient Education  2018 [employer] Inc[employer]  

## 2017-09-19 LAB — FERRITIN: Ferritin: 66 ng/mL (ref 22–316)

## 2017-09-19 LAB — TSH: TSH: 0.68 u[IU]/mL (ref 0.320–4.118)

## 2017-09-19 LAB — IRON AND TIBC
Iron: 50 ug/dL (ref 42–163)
Saturation Ratios: 28 % — ABNORMAL LOW (ref 42–163)
TIBC: 180 ug/dL — ABNORMAL LOW (ref 202–409)
UIBC: 130 ug/dL

## 2017-09-29 ENCOUNTER — Other Ambulatory Visit: Payer: Self-pay

## 2017-09-29 ENCOUNTER — Inpatient Hospital Stay: Payer: Medicare Other

## 2017-09-29 VITALS — BP 117/59 | HR 66 | Temp 98.0°F | Resp 18

## 2017-09-29 DIAGNOSIS — D500 Iron deficiency anemia secondary to blood loss (chronic): Secondary | ICD-10-CM

## 2017-09-29 DIAGNOSIS — D509 Iron deficiency anemia, unspecified: Secondary | ICD-10-CM | POA: Diagnosis not present

## 2017-09-29 DIAGNOSIS — Z79899 Other long term (current) drug therapy: Secondary | ICD-10-CM | POA: Diagnosis not present

## 2017-09-29 DIAGNOSIS — C4372 Malignant melanoma of left lower limb, including hip: Secondary | ICD-10-CM | POA: Diagnosis not present

## 2017-09-29 MED ORDER — HEPARIN SOD (PORK) LOCK FLUSH 100 UNIT/ML IV SOLN
500.0000 [IU] | Freq: Once | INTRAVENOUS | Status: AC | PRN
  Administered 2017-09-29: 500 [IU]
  Filled 2017-09-29: qty 5

## 2017-09-29 MED ORDER — SODIUM CHLORIDE 0.9% FLUSH
10.0000 mL | INTRAVENOUS | Status: DC | PRN
  Administered 2017-09-29: 10 mL
  Filled 2017-09-29: qty 10

## 2017-09-29 MED ORDER — SODIUM CHLORIDE 0.9 % IV SOLN
510.0000 mg | Freq: Once | INTRAVENOUS | Status: AC
  Administered 2017-09-29: 510 mg via INTRAVENOUS
  Filled 2017-09-29: qty 17

## 2017-09-29 NOTE — Patient Instructions (Signed)

## 2017-09-30 ENCOUNTER — Encounter: Payer: Self-pay | Admitting: Internal Medicine

## 2017-09-30 ENCOUNTER — Ambulatory Visit (INDEPENDENT_AMBULATORY_CARE_PROVIDER_SITE_OTHER): Payer: Medicare Other | Admitting: Internal Medicine

## 2017-09-30 ENCOUNTER — Telehealth: Payer: Self-pay | Admitting: Internal Medicine

## 2017-09-30 DIAGNOSIS — I480 Paroxysmal atrial fibrillation: Secondary | ICD-10-CM | POA: Diagnosis not present

## 2017-09-30 DIAGNOSIS — E43 Unspecified severe protein-calorie malnutrition: Secondary | ICD-10-CM | POA: Diagnosis not present

## 2017-09-30 DIAGNOSIS — I10 Essential (primary) hypertension: Secondary | ICD-10-CM

## 2017-09-30 DIAGNOSIS — C4372 Malignant melanoma of left lower limb, including hip: Secondary | ICD-10-CM

## 2017-09-30 DIAGNOSIS — I2510 Atherosclerotic heart disease of native coronary artery without angina pectoris: Secondary | ICD-10-CM | POA: Diagnosis not present

## 2017-09-30 DIAGNOSIS — F419 Anxiety disorder, unspecified: Secondary | ICD-10-CM | POA: Diagnosis not present

## 2017-09-30 MED ORDER — HYDROMORPHONE HCL 2 MG PO TABS: 2.0000 mg | ORAL_TABLET | Freq: Three times a day (TID) | ORAL | 0 refills | Status: DC | PRN

## 2017-09-30 MED ORDER — ALPRAZOLAM 0.5 MG PO TABS: 0.5000 mg | ORAL_TABLET | Freq: Two times a day (BID) | ORAL | 3 refills | Status: DC | PRN

## 2017-09-30 MED ORDER — TRAZODONE HCL 100 MG PO TABS
ORAL_TABLET | ORAL | 0 refills | Status: DC
Start: 2017-09-30 — End: 2017-10-31

## 2017-09-30 NOTE — Assessment & Plan Note (Signed)
Nutrition discussed

## 2017-09-30 NOTE — Telephone Encounter (Signed)
Copied from [place] [id number]. Topic: Quick Communication - See Telephone Encounter >> Sep 30, 2017  3:29 PM [name] wrote: CRM for notification. See Telephone encounter for: 09/30/17. Patient needs ok from provider for early refill on ALPRAZolam (XANAX) 0.5 MG tablet. Please contact [facility] on [address]

## 2017-09-30 NOTE — Assessment & Plan Note (Signed)
Lopressor and Losartan

## 2017-09-30 NOTE — Assessment & Plan Note (Signed)
Xanax prn  Potential benefits of a long term benzodiazepines  use as well as potential risks  and complications were explained to the patient and were aknowledged. Not to take w/dilaudid

## 2017-09-30 NOTE — Progress Notes (Signed)
Subjective:  Patient ID: [name], male    DOB: 09-Mar-1946  Age: 72 y.o. MRN: [id number]  CC: No chief complaint on file.   HPI [name] presents for chronic pain, RA, melanoma on chemo - poor appetite...  Outpatient Medications Prior to Visit  Medication Sig Dispense Refill  . albuterol (PROVENTIL HFA;VENTOLIN HFA) 108 (90 Base) MCG/ACT inhaler Inhale 1-2 puffs into the lungs every 4 (four) hours as needed for wheezing or shortness of breath. 1 Inhaler 1  . alfuzosin (UROXATRAL) 10 MG 24 hr tablet Take 10 mg by mouth at bedtime.   0  . ALPRAZolam (XANAX) 0.5 MG tablet Take 1 tablet (0.5 mg total) by mouth 2 (two) times daily as needed. for anxiety 60 tablet 3  . Cholecalciferol (VITAMIN D3) 2000 UNITS capsule Take 1 capsule (2,000 Units total) by mouth daily. 100 capsule 3  . clopidogrel (PLAVIX) 75 MG tablet TAKE 1 TABLET BY MOUTH EVERY DAY WITH BREAKFAST 90 tablet 3  . COLCRYS 0.6 MG tablet Take 1 tablet by mouth every evening.   2  . dronabinol (MARINOL) 5 MG capsule Take 1 capsule (5 mg total) by mouth 2 (two) times daily before a meal. 60 capsule 2  . FLUoxetine (PROZAC) 40 MG capsule TAKE 1 CAPSULE BY MOUTH TWICE DAILY 180 capsule 0  . fluticasone (FLONASE) 50 MCG/ACT nasal spray INSTILL 2 SPRAYS IN EACH NOSTRIL EVERY DAY 16 g 11  . gabapentin (NEURONTIN) 300 MG capsule Take 1 capsule (300 mg total) by mouth 2 (two) times daily. 180 capsule 1  . HYDROmorphone (DILAUDID) 2 MG tablet Take 1 tablet (2 mg total) by mouth every 8 (eight) hours as needed for severe pain. Please fill on or after 09/06/17 120 tablet 0  . lidocaine-prilocaine (EMLA) cream Apply 1 application topically as needed. 30 g 5  . linaclotide (LINZESS) 145 MCG CAPS capsule Take 1 capsule (145 mcg total) by mouth daily before breakfast. 30 capsule 1  . losartan-hydrochlorothiazide (HYZAAR) 50-12.5 MG tablet TAKE 1 TABLET BY MOUTH DAILY 90 tablet 1  . Melatonin 5 MG TABS Take 5 mg by mouth at bedtime.  Takes 10 mg at HS    . metoprolol tartrate (LOPRESSOR) 25 MG tablet Take 1 tablet (25 mg total) by mouth 2 (two) times daily. 180 tablet 1  . mupirocin ointment (BACTROBAN) 2 % APP ON THE SKIN ONCE D  0  . Omega-3 Fatty Acids (FISH OIL OMEGA-3) 1000 MG CAPS Take 1,000 mg by mouth daily.     . ondansetron (ZOFRAN) 8 MG tablet Take 1 tablet (8 mg total) by mouth every 8 (eight) hours as needed for nausea or vomiting. 30 tablet 1  . pantoprazole (PROTONIX) 40 MG tablet Take 1 tablet (40 mg total) by mouth every evening. 90 tablet 1  . pravastatin (PRAVACHOL) 20 MG tablet Take 1 tablet (20 mg total) by mouth daily. (Patient taking differently: Take 20 mg by mouth every evening. ) 90 tablet 1  . predniSONE (DELTASONE) 5 MG tablet Take 5 mg by mouth daily with breakfast.    . prochlorperazine (COMPAZINE) 10 MG tablet TAKE 1 TABLET(10 MG) BY MOUTH EVERY 6 HOURS AS NEEDED FOR NAUSEA OR VOMITING 385 tablet 1  . tadalafil (CIALIS) 5 MG tablet Take 5 mg by mouth as needed.    . traZODone (DESYREL) 100 MG tablet TAKE 1 TABLET(100 MG) BY MOUTH AT BEDTIME AS NEEDED FOR SLEEP 30 tablet 0  . vitamin B-12 (CYANOCOBALAMIN) 1000 MCG tablet  Take 1,000 mcg by mouth daily.      No facility-administered medications prior to visit.     ROS: Review of Systems  Constitutional: Positive for fatigue. Negative for appetite change and unexpected weight change.  HENT: Negative for congestion, nosebleeds, sneezing, sore throat and trouble swallowing.   Eyes: Negative for itching and visual disturbance.  Respiratory: Negative for cough.   Cardiovascular: Negative for chest pain, palpitations and leg swelling.  Gastrointestinal: Negative for abdominal distention, blood in stool, diarrhea and nausea.  Genitourinary: Negative for frequency and hematuria.  Musculoskeletal: Positive for arthralgias, back pain and gait problem. Negative for joint swelling and neck pain.  Skin: Negative for rash.  Neurological: Negative for  dizziness, tremors, speech difficulty and weakness.  Psychiatric/Behavioral: Negative for agitation, dysphoric mood, sleep disturbance and suicidal ideas. The patient is not nervous/anxious.     Objective:  BP 114/72 (BP Location: Left Arm, Patient Position: Sitting, Cuff Size: Normal)   Pulse 61   Temp 98.2 F (36.8 C) (Oral)   Ht 5' 6"  (1.676 m)   Wt 137 lb (62.1 kg)   SpO2 98%   BMI 22.11 kg/m   BP Readings from Last 3 Encounters:  09/30/17 114/72  09/29/17 (!) 117/59  09/18/17 108/70    Wt Readings from Last 3 Encounters:  09/30/17 137 lb (62.1 kg)  09/18/17 138 lb (62.6 kg)  09/09/17 146 lb 8 oz (66.5 kg)    Physical Exam  Constitutional: He is oriented to person, place, and time. He appears well-developed. No distress.  NAD  HENT:  Mouth/Throat: Oropharynx is clear and moist.  Eyes: Pupils are equal, round, and reactive to light. Conjunctivae are normal.  Neck: Normal range of motion. No JVD present. No thyromegaly present.  Cardiovascular: Normal rate, regular rhythm, normal heart sounds and intact distal pulses. Exam reveals no gallop and no friction rub.  No murmur heard. Pulmonary/Chest: Effort normal and breath sounds normal. No respiratory distress. He has no wheezes. He has no rales. He exhibits no tenderness.  Abdominal: Soft. Bowel sounds are normal. He exhibits no distension and no mass. There is no tenderness. There is no rebound and no guarding.  Musculoskeletal: Normal range of motion. He exhibits no edema or tenderness.  Lymphadenopathy:    He has no cervical adenopathy.  Neurological: He is alert and oriented to person, place, and time. He has normal reflexes. No cranial nerve deficit. He exhibits normal muscle tone. He displays a negative Romberg sign. Coordination and gait normal.  Skin: Skin is warm and dry. No rash noted.  Psychiatric: He has a normal mood and affect. His behavior is normal. Judgment and thought content normal.   Ataxic Painful  joints, LS  Lab Results  Component Value Date   WBC 5.5 09/18/2017   HGB 12.3 (L) 09/18/2017   HCT 37.9 (L) 09/18/2017   PLT 203 09/18/2017   GLUCOSE 139 (H) 09/18/2017   CHOL 128 03/10/2015   TRIG 95.0 03/10/2015   HDL 39.30 03/10/2015   LDLCALC 70 03/10/2015   ALT 16 09/18/2017   AST 17 09/18/2017   NA 139 09/18/2017   K 3.1 (L) 09/18/2017   CL 100 09/18/2017   CREATININE 1.30 (H) 09/18/2017   BUN 10 09/18/2017   CO2 30 09/18/2017   TSH 0.680 09/18/2017   PSA 1.95 10/15/2010   INR 1.1 (H) 06/14/2014   HGBA1C 6.0 09/28/2015    US Venous Img Lower Unilateral Left  Result Date: 07/24/2017 CLINICAL DATA:  72 year old male  with a history of left lower extremity pain and swelling EXAM: LEFT LOWER EXTREMITY VENOUS DOPPLER ULTRASOUND TECHNIQUE: Gray-scale sonography with graded compression, as well as color Doppler and duplex ultrasound were performed to evaluate the lower extremity deep venous systems from the level of the common femoral vein and including the common femoral, femoral, profunda femoral, popliteal and calf veins including the posterior tibial, peroneal and gastrocnemius veins when visible. The superficial great saphenous vein was also interrogated. Spectral Doppler was utilized to evaluate flow at rest and with distal augmentation maneuvers in the common femoral, femoral and popliteal veins. COMPARISON:  None. FINDINGS: Contralateral Common Femoral Vein: Respiratory phasicity is normal and symmetric with the symptomatic side. No evidence of thrombus. Normal compressibility. Common Femoral Vein: No evidence of thrombus. Normal compressibility, respiratory phasicity and response to augmentation. Saphenofemoral Junction: No evidence of thrombus. Normal compressibility and flow on color Doppler imaging. Profunda Femoral Vein: No evidence of thrombus. Normal compressibility and flow on color Doppler imaging. Femoral Vein: No evidence of thrombus. Normal compressibility, respiratory  phasicity and response to augmentation. Popliteal Vein: No evidence of thrombus. Normal compressibility, respiratory phasicity and response to augmentation. Calf Veins: No evidence of thrombus. Normal compressibility and flow on color Doppler imaging. Superficial Great Saphenous Vein: No evidence of thrombus. Normal compressibility and flow on color Doppler imaging. Other Findings:  Edema of the left lower extremity. Fluid collection in the popliteal region with internal echogenicity measuring 5.1 cm x 1.0 cm x 1.7 cm IMPRESSION: Sonographic survey of the left lower extremity negative for DVT. Likely complex left-sided Baker's cyst Left leg edema Electronically Signed   By: Corrie Mckusick D.O.   On: 07/24/2017 13:00    Assessment & Plan:   There are no diagnoses linked to this encounter.   No orders of the defined types were placed in this encounter.    Follow-up: No follow-ups on file.  [name], MD

## 2017-09-30 NOTE — Assessment & Plan Note (Signed)
On chemo

## 2017-09-30 NOTE — Assessment & Plan Note (Signed)
Dr Johnsie Cancel On-set May '12 - RVR: had in-hospital [facility] with conversion and long term holding of sinus rhythm

## 2017-10-01 ENCOUNTER — Telehealth: Payer: Self-pay | Admitting: Internal Medicine

## 2017-10-01 NOTE — Telephone Encounter (Signed)
LMTCB, last filled 09/12/17

## 2017-10-01 NOTE — Telephone Encounter (Signed)
Copied from [place] [id number]. Topic: General - Other >> Oct 01, 2017  5:08 PM Cecelia Byars, NT wrote: Reason for CRM: Patient called and said he is ok on the other 2 medications until next Friday please call him at [phone]

## 2017-10-02 NOTE — Telephone Encounter (Signed)
See other TE.

## 2017-10-02 NOTE — Telephone Encounter (Signed)
LMTCB

## 2017-10-07 NOTE — Telephone Encounter (Signed)
Per [place] registry alprazolam was filled 10/01/2017. Closing encounter.Marland KitchenJohny Chess

## 2017-10-14 ENCOUNTER — Inpatient Hospital Stay: Payer: Medicare Other | Attending: Hematology & Oncology

## 2017-10-14 ENCOUNTER — Ambulatory Visit (HOSPITAL_BASED_OUTPATIENT_CLINIC_OR_DEPARTMENT_OTHER)
Admission: RE | Admit: 2017-10-14 | Discharge: 2017-10-14 | Disposition: A | Discharge status: Home / Self Care | Payer: Medicare Other | Source: Ambulatory Visit | Attending: Hematology & Oncology | Admitting: Hematology & Oncology

## 2017-10-14 DIAGNOSIS — J9811 Atelectasis: Secondary | ICD-10-CM | POA: Diagnosis not present

## 2017-10-14 DIAGNOSIS — Z5112 Encounter for antineoplastic immunotherapy: Secondary | ICD-10-CM | POA: Insufficient documentation

## 2017-10-14 DIAGNOSIS — Z95828 Presence of other vascular implants and grafts: Secondary | ICD-10-CM

## 2017-10-14 DIAGNOSIS — R590 Localized enlarged lymph nodes: Secondary | ICD-10-CM | POA: Diagnosis not present

## 2017-10-14 DIAGNOSIS — C4372 Malignant melanoma of left lower limb, including hip: Secondary | ICD-10-CM

## 2017-10-14 DIAGNOSIS — J986 Disorders of diaphragm: Secondary | ICD-10-CM | POA: Diagnosis not present

## 2017-10-14 DIAGNOSIS — R918 Other nonspecific abnormal finding of lung field: Secondary | ICD-10-CM | POA: Diagnosis not present

## 2017-10-14 DIAGNOSIS — N433 Hydrocele, unspecified: Secondary | ICD-10-CM | POA: Diagnosis not present

## 2017-10-14 DIAGNOSIS — M9669 Fracture of other bone following insertion of orthopedic implant, joint prosthesis, or bone plate: Secondary | ICD-10-CM | POA: Insufficient documentation

## 2017-10-14 DIAGNOSIS — Z452 Encounter for adjustment and management of vascular access device: Secondary | ICD-10-CM | POA: Diagnosis not present

## 2017-10-14 DIAGNOSIS — K5730 Diverticulosis of large intestine without perforation or abscess without bleeding: Secondary | ICD-10-CM | POA: Diagnosis not present

## 2017-10-14 DIAGNOSIS — I2510 Atherosclerotic heart disease of native coronary artery without angina pectoris: Secondary | ICD-10-CM | POA: Diagnosis not present

## 2017-10-14 DIAGNOSIS — I700 Atherosclerosis of aorta: Secondary | ICD-10-CM | POA: Diagnosis not present

## 2017-10-14 MED ORDER — IOPAMIDOL (ISOVUE-300) INJECTION 61%
100.0000 mL | Freq: Once | INTRAVENOUS | Status: AC | PRN
  Administered 2017-10-14: 100 mL via INTRAVENOUS

## 2017-10-14 MED ORDER — HEPARIN SOD (PORK) LOCK FLUSH 100 UNIT/ML IV SOLN
500.0000 [IU] | Freq: Once | INTRAVENOUS | Status: AC
  Administered 2017-10-14: 500 [IU] via INTRAVENOUS
  Filled 2017-10-14: qty 5

## 2017-10-14 MED ORDER — SODIUM CHLORIDE 0.9% FLUSH
10.0000 mL | INTRAVENOUS | Status: DC | PRN
  Administered 2017-10-14: 10 mL via INTRAVENOUS
  Filled 2017-10-14: qty 10

## 2017-10-14 NOTE — Patient Instructions (Signed)
Implanted Port Home Guide An implanted port is a type of central line that is placed under the skin. Central lines are used to provide IV access when treatment or nutrition needs to be given through a person's veins. Implanted ports are used for long-term IV access. An implanted port may be placed because:  You need IV medicine that would be irritating to the small veins in your hands or arms.  You need long-term IV medicines, such as antibiotics.  You need IV nutrition for a long period.  You need frequent blood draws for lab tests.  You need dialysis.  Implanted ports are usually placed in the chest area, but they can also be placed in the upper arm, the abdomen, or the leg. An implanted port has two main parts:  Reservoir. The reservoir is round and will appear as a small, raised area under your skin. The reservoir is the part where a needle is inserted to give medicines or draw blood.  Catheter. The catheter is a thin, flexible tube that extends from the reservoir. The catheter is placed into a large vein. Medicine that is inserted into the reservoir goes into the catheter and then into the vein.  How will I care for my incision site? Do not get the incision site wet. Bathe or shower as directed by your health care provider. How is my port accessed? Special steps must be taken to access the port:  Before the port is accessed, a numbing cream can be placed on the skin. This helps numb the skin over the port site.  Your health care provider uses a sterile technique to access the port. ? Your health care provider must put on a mask and sterile gloves. ? The skin over your port is cleaned carefully with an antiseptic and allowed to dry. ? The port is gently pinched between sterile gloves, and a needle is inserted into the port.  Only "non-coring" port needles should be used to access the port. Once the port is accessed, a blood return should be checked. This helps ensure that the port  is in the vein and is not clogged.  If your port needs to remain accessed for a constant infusion, a clear (transparent) bandage will be placed over the needle site. The bandage and needle will need to be changed every week, or as directed by your health care provider.  Keep the bandage covering the needle clean and dry. Do not get it wet. Follow your health care provider's instructions on how to take a shower or bath while the port is accessed.  If your port does not need to stay accessed, no bandage is needed over the port.  What is flushing? Flushing helps keep the port from getting clogged. Follow your health care provider's instructions on how and when to flush the port. Ports are usually flushed with saline solution or a medicine called heparin. The need for flushing will depend on how the port is used.  If the port is used for intermittent medicines or blood draws, the port will need to be flushed: ? After medicines have been given. ? After blood has been drawn. ? As part of routine maintenance.  If a constant infusion is running, the port may not need to be flushed.  How long will my port stay implanted? The port can stay in for as long as your health care provider thinks it is needed. When it is time for the port to come out, surgery will be   done to remove it. The procedure is similar to the one performed when the port was put in. When should I seek immediate medical care? When you have an implanted port, you should seek immediate medical care if:  You notice a bad smell coming from the incision site.  You have swelling, redness, or drainage at the incision site.  You have more swelling or pain at the port site or the surrounding area.  You have a fever that is not controlled with medicine.  This information is not intended to replace advice given to you by your health care provider. Make sure you discuss any questions you have with your health care provider. Document  Released: 03/25/2005 Document Revised: 08/31/2015 Document Reviewed: 11/30/2012 Elsevier Interactive Patient Education  2017 [employer]  

## 2017-10-15 ENCOUNTER — Encounter: Payer: Self-pay | Admitting: Neurology

## 2017-10-15 ENCOUNTER — Ambulatory Visit (INDEPENDENT_AMBULATORY_CARE_PROVIDER_SITE_OTHER): Payer: Medicare Other | Admitting: Neurology

## 2017-10-15 ENCOUNTER — Other Ambulatory Visit (HOSPITAL_BASED_OUTPATIENT_CLINIC_OR_DEPARTMENT_OTHER): Payer: Medicare Other

## 2017-10-15 ENCOUNTER — Telehealth: Payer: Self-pay

## 2017-10-15 VITALS — BP 106/62 | HR 74 | Ht 66.0 in | Wt 145.0 lb

## 2017-10-15 DIAGNOSIS — R269 Unspecified abnormalities of gait and mobility: Secondary | ICD-10-CM | POA: Diagnosis not present

## 2017-10-15 DIAGNOSIS — I2510 Atherosclerotic heart disease of native coronary artery without angina pectoris: Secondary | ICD-10-CM | POA: Diagnosis not present

## 2017-10-15 DIAGNOSIS — Z87898 Personal history of other specified conditions: Secondary | ICD-10-CM

## 2017-10-15 NOTE — Telephone Encounter (Addendum)
-----   Message from [name], MD sent at 10/15/2017  6:09 AM EDT ----- Call - NO melanoma on the CT scan!!  [name]  Above message given to pt via phone. Pt verbalizes understanding and appreciation. dph

## 2017-10-15 NOTE — Telephone Encounter (Deleted)
-----   Message from [name], MD sent at 10/15/2017  6:09 AM EDT ----- Call - NO melanoma on the CT scan!!  pete

## 2017-10-15 NOTE — Progress Notes (Signed)
Subjective:    Patient ID: [name] is a 72 y.o. male.  HPI     Interim history:   [name] is a 72 year old right-handed gentleman with an underlying complex medical history of hypertension, osteoarthritis, diverticulosis, hemochromatosis, coronary artery disease, allergic rhinitis, rheumatoid arthritis, COPD, and malignant melanoma, who presents for followup consultation of his gait disorder, and recurrent headaches. He is unaccompanied today and presents after a long gap nearly 3 years. I last saw him on 11/27/2016, at which time he reported doing rather well. He had no significant headaches. He had some neck pain and that seen neurosurgery for this. He was taking narcotic pain medication very sparingly.  Today, 10/15/2017: He reports feeling stable, no recent HA, no recent neurological issues, got recent clearance from the melanoma standpoint, thankfully, no recent fall. He has had worsening RA, especially. Worries about wife's memory decline.   The patient's allergies, current medications, family history, past medical history, past social history, past surgical history and problem list were reviewed and updated as appropriate.   Previously (copied from previous notes for reference):   I saw him on 06/29/2014, at which time he reported feeling quite well. He was trying to exercise regularly. He was still having significant physical limitations because of residual back pain and his ongoing and long-standing history of rheumatoid arthritis. He was not always drinking enough water. He was taking Benadryl for sleep each night. Overall however, he felt that he was sleeping better. He had very few headaches. He felt that the IV anti-body aches and symptomatic medications for his back pain had helped. He was taking gabapentin every day. This was from when he had back surgery years ago. He was taking baby aspirin, Plavix, Aleve as needed and very rarely Nucynta, once or 2 times a week,  and Robaxin bid. He reported no chest pain or shortness of breath.      I saw him on 01/28/2014, at which time he reported doing well. His headaches resolved after he was placed on vancomycin. He was off of Humira and did not start Enbrel per rheumatology. He was on Nucynta once daily. He was overall doing well since his last hospitalization. I did not suggest any new medications. In the interim, he had a follow-up with his cardiologist. He was reporting exertional chest pain. He had a stress test in February 2016 which I reviewed which was fairly benign. He did have a left heart catheter and had stenting of the LAD on 06/17/2014. I reviewed the hospital records. He was placed on dual antiplatelet therapy which she is supposed to continue for 12 months.   I saw him on 09/20/2013, at which time he presented for a sooner than scheduled appointment for new onset headaches. I had seen him before for altered mental status and balance problems as well as ataxia. He reported at the last visit new onset headaches since March 2015. These are typically right-sided. He had tried Imitrex per primary care provider and had also been given IV Depacon which helped. He was seen in ENT. He had gone through speech therapy. In the interim, he was seen by our nurse practitioner, Jeani Hawking on 11/18/2013 for a sooner than scheduled appointment because of an acute severe headache. He was found to have a fever of 101.7 orally. We sent him to the emergency room in light of his fever, history of immune suppressant use, severe headache associated with nausea and also neck stiffness. He was treated for possible subdural empyema  with IV antibiotics and discharged on oral antibiotics. MRI brain without contrast on 11/18/2013 showed no new findings and chronic changes. MRI lumbar spine with and without contrast on 11/18/2013 showed: Enhancing debris in the dependent portion of the thecal sac and upper sacral subdural space consistent with  postoperative infection/meningitis/subdural empyema. This has significantly progressed from February 2015. Surgical consultation is warranted. Slight enhancement of the L3-4 disc space has progressed from priors, equivocal for diskitis.    I saw him on 07/26/2013, at which time we discussed his multiple recent hospitalizations. He reported problems with hoarseness and softer speech and I referred him to ENT. I also ordered a swallow study. This was done on 08/11/2013 and showed mild sensory base pharyngeal dysphagia, delayed swallow reflex, penetration and aspiration of thin and nectar thick liquids with no reflexive cough, compensatory positioning with chin talk was ineffective, however individual small boluses decreased penetration and aspiration. He was advised to followup with speech therapy. I made a referral in that regard. The patient called on 09/06/2013 reporting a two-week headache. Dr. Brett Fairy saw him on my behalf as I was not in the office that week, and he was treated in our office with Depacon infusion as well as IV Solu-Medrol, 500 mg each. He improved. Dr. Brett Fairy ordered a repeat brain MRI and he had a brain MRI with and without contrast on 09/18/2013: Abnormal MRI scan of the brain showing mild changes of chronic microvascular ischemia and generalized cerebral atrophy. Overall no significant change compared with previous MRI scan dated 06/30/2012. In addition, personally reviewed the images through the PACS system.   I saw him on 12/15/2012, at which time we talked about previous test results again. We talked about his hospitalization from April 2014. Overall he had improved at the time. Workup had shown increased CSF protein, increased IgG index, positive oligoclonal bands. This of course in the context of autoimmune disease, namely rheumatoid arthritis, on Humira. EEG was negative. We looked for cancer with screening CT abdomen pelvis and chest x-ray. He has been on immune suppressant,  Humira. Brain MRI did not show any demyelinating process or mass or enhancing lesions. I considered a second opinion at a larger [facility] and a repeat brain MRI. His last CTH was on 06/30/13: Involutional and chronic changes without evidence of focal or acute abnormalities.   On 03/02/2013 he had lower spine surgery with decompression of the thecal sac and facetectomy, L2-3 discectomy, removal of herniated disc, placement of pedicle screws L1-3 and S1, fusion from L1-S1 involving the L4-5 fusion; this was under Dr. Joya Salm. Postoperatively he developed hypotension. He was discharged on 03/09/2013 to a skilled nursing facility. On 03/16/2013 he developed fever and vomiting and was readmitted under neurosurgery. He was found to have hypovolemia, hyponatremia and elevation in BUN and creatinine. On 03/17/2013 he had revision of a lumbar wound.   On 03/19/2013 he was seen by infectious disease for a lumbar wound infection and treatment thereof. He was discharged to home on 04/06/2013 after being treated for a wound infection with IV antibiotics. He presented on 04/13/2013 with fever and hypotension and was sent to the emergency room for further workup and treatment. ENT was consulted for hoarseness and GI was consulted for concern for small bowel obstruction which was treated conservatively. He was discharged on 04/24/2013. He has had multiple falls with and without injuries, most recently a laceration to his face on 07/01/2013. He has had [place] physical therapy and speech therapy. He lives with his  son currently. He is supposed to have 24 hour supervision and has not been fully compliant with the use of assistive devices as recommended by home health PT.   He was admitted on 05/06/2013 for AMS, deemed secondary to polypharmacy including opioid overdose, presenting with mental status changes, acute renal failure, acute respiratory failure with hypoxia. He was discharged on 05/09/2013.   He was admitted on  05/23/2013 with generalized weakness, fever, nausea and vomiting. He was transferred to inpatient rehabilitation on 05/27/2013. He was discharged on 06/10/2013.   He was admitted on 06/16/2013 for altered mental status. He was discharged on 06/22/2013. He presented to the emergency room on 06/30/2013 for fever. He presented to the emergency room on 07/01/2013 for a fall.   I saw him on 10/02/2012 after a recent hospitalization.   I first met him on 07/14/2012 at the request of Dr. Ronnald Ramp. He has an underlying complex medical history including rheumatoid arthritis, osteoarthritis, COPD, atrial fibrillation, anemia, hypertension, heart disease, reported fairly sudden onset unsteadiness of his gait, vertigo, dizziness, blurry vision since early March 2014 there was no significant prodromal illness. His wife had noted some slurring of speech. Head CT on 06/22/2012 showed no acute abnormality. 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 He also reported some nausea for which meclizine and scopolamine patch helped. I arranged for a hospital admission from clinic because of concerns with cerebellar dysfunction including dysarthria, nystagmus, but also some alteration in his mental status. The patient was not fully oriented and was sluggish in his responses and seemed confused, talking out of context at times. He had mild dysmetria bilaterally.   CSF showed: increased protein, increased IgG index and positive oligoclonal bands in his CSF. His altered mental status improved and his exam was better and he benefited from physical therapy which he completed. I felt that he had a variety of differential diagnoses possible including cerebellitis, and cephalitis, metabolic-toxic or other immune-paraneoplastic causes of his presentation. I recommended paraneoplastic testing. EEG was normal, chest x-ray was negative, CT abdomen and pelvis were negative. Of note, he has  been on immune suppression with Humira. MRI did not show any demyelinating lesions.   His paraneoplastic testing was negative in July 2014. He had foot surgery on the right. He has been on Humira for the past 2 years, Severy injections every other week.    His Past Medical History Is Significant For: Past Medical History:  Diagnosis Date  . Alcoholism /alcohol abuse ([place])    per family  . Allergic rhinitis   . Anxiety   . Bacterial infection   . CAD (coronary artery disease)    minimal coronary plaque in the LAD and right coronary system. 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 7-06. Last nuclear stress 11-17-06 fixed anterior/ inferior defect, no inducible ischemia, EF 81%  . Chronic back pain    "all over back"  . Chronic neck pain   . Depression   . Diverticulosis   . Dyspnea    with exertion  . Dysrhythmia 01-24-12   past hx. A.Fib x1 episode-responded to med.  . Falls frequently    "since 02/2013" (06/16/2013)  . Family history of cancer   . Genetic testing 03/06/2017   Multi-Cancer panel (83 genes) @ [facility] - No pathogenic mutations detected  . GERD (gastroesophageal reflux disease)   . Hemochromatosis    dx'd 14 yrs ago last ferritin  Aug 11, 08 52 (22-322), Fe 136 ("I had 250 phlebotomies for that")  . High cholesterol    hx  . Hx of colonic polyps   . Hx of colonoscopy   . Hypertension   . Iron deficiency anemia due to chronic blood loss 12/27/2016  . Malignant melanoma of knee, left ([place])   . Myocardial infarction [facility]) 2006   "related to catheterization"  . Narcotic abuse ([place])    per family  . Osteoarthritis   . PONV (postoperative nausea and vomiting)   . RA (rheumatoid arthritis) ([place])     His Past Surgical History Is Significant For: Past Surgical History:  Procedure Laterality Date  . ABDOMINAL ADHESION SURGERY  ~ 1968  . ANKLE RECONSTRUCTION Right 6-09   [facility]  . APPENDECTOMY  ~ 1956  .  ASPIRATION OF ABSCESS Left 03/27/2017   Procedure: ASPIRATION OF SEROMA;  Surgeon: Stark Klein, MD;  Location: [place];  Service: General;  Laterality: Left;  . BONE TUMOR RESECTION  ~ 1954   "taken off my mastoid"  . CARPAL TUNNEL RELEASE Right 1990's  . CATARACT EXTRACTION, BILATERAL Bilateral 01-24-12  . CORONARY ANGIOPLASTY WITH STENT PLACEMENT  2006   "while repairing 1st stent, a second area tore and they had to place 2nd stent " ?LAD & CX  . CORONARY ANGIOPLASTY WITH STENT PLACEMENT  06/17/2014  . CYST EXCISION  "several OR's"   "backX 2, back of my neck, face, inside right bicept, chest, wrist"  . FOOT SURGERY Right 11-08   for removal of bone spurs-  . HAMMER TOE SURGERY Right 07/2012   "broke 4 hammertoes"   . HARDWARE REMOVAL  03/09/2012   Procedure: HARDWARE REMOVAL;  Surgeon: Nita Sells, MD;  Location: [place];  Service: Orthopedics;  Laterality: Left;  Hardware Removal from Left Shoulder  . HARVEST BONE GRAFT  02/06/2012   Procedure: HARVEST ILIAC BONE GRAFT;  Surgeon: Nita Sells, MD;  Location: [facility];  Service: Orthopedics;;  bone marrow aspirqation   . INGUINAL HERNIA REPAIR Bilateral   . JOINT REPLACEMENT    . KNEE SURGERY Left ~ 2003   "6-12 months after uni knee removed synovial sack"  . LEFT HEART CATHETERIZATION WITH CORONARY ANGIOGRAM N/A 06/17/2014   PCI of diffuse severe stenosis in the proximal to mid LAD using overlapping drug-eluting stents.  . LUMBAR WOUND DEBRIDEMENT N/A 03/17/2013   Procedure: Incision and drainage of superficial lumbar wound;  Surgeon: Floyce Stakes, MD;  Location: [place] [facility];  Service: Neurosurgery;  Laterality: N/A;  Incision and drainage of superficial lumbar wound  . MECKEL DIVERTICULUM EXCISION  ~ 1956  . MELANOMA EXCISION WITH SENTINEL LYMPH NODE BIOPSY Left 02/12/2017   WIDE LOCAL EXCISION LEFT KNEE MELANOMA, ADVANCEMENT FLAP CLOSURE, AND SENTINEL LYMPH NODE MAPPING  AND BIOPSY.  [name] MELANOMA EXCISION WITH SENTINEL LYMPH NODE BIOPSY Left 02/12/2017   Procedure: WIDE LOCAL EXCISION LEFT KNEE MELANOMA, ADVANCEMENT FLAP CLOSURE, AND SENTINEL LYMPH NODE MAPPING AND BIOPSY.;  Surgeon: Stark Klein, MD;  Location: [place];  Service: General;  Laterality: Left;  GENERAL AND LOCAL  . ORIF SHOULDER FRACTURE  02/06/2012   Procedure: OPEN REDUCTION INTERNAL FIXATION (ORIF) SHOULDER FRACTURE;  Surgeon: Nita Sells, MD;  Location: [facility];  Service: Orthopedics;  Laterality: Left;  ORIF of a Left Shoulder Fracture with  Iliac Crest Bone Graft aspiration   . PORTACATH PLACEMENT Left 03/27/2017   Procedure: INSERTION PORT-A-CATH;  Surgeon: Stark Klein, MD;  Location: [place];  Service: General;  Laterality: Left;  . POSTERIOR LUMBAR FUSION  12-10   L4-5 diskectomy w/ fusion, cage placement and rods; [place]  . POSTERIOR LUMBAR FUSION 4 LEVEL N/A 03/02/2013   Procedure: Lumbar One to Sacral One Posterior lumbar interbody fusion;  Surgeon: Floyce Stakes, MD;  Location: [place] [facility];  Service: Neurosurgery;  Laterality: N/A;  L1 to S1 Posterior lumbar interbody fusion  . REPLACEMENT UNICONDYLAR JOINT KNEE Left ~ 2003   "~ 6 months after total knee replaced"  . SHOULDER ARTHROSCOPY Left ~ 2004 X 2   "@ [facility]; left bone splinter in & had to clean it out"  . TOTAL ANKLE REPLACEMENT Right 2008   at [facility]  . TOTAL KNEE ARTHROPLASTY Bilateral 2002  . TOTAL SHOULDER REPLACEMENT Left 2006  . TOTAL SHOULDER REPLACEMENT Right ~ 2007   Dr. Marlou Sa    His Family History Is Significant For: Family History  Problem Relation Age of Onset  . Uterine cancer Mother        uterine vs. cervix? dx 58s; deceased 107  . Macular degeneration Mother   . Other Mother        ankle edema  . Lung cancer Mother   . Melanoma Mother        multiple spots on legs  . Cancer - Lung Mother        smoker  . Coronary artery disease Father   . Hypertension Father   .  Prostate cancer Father   . Colon polyps Father   . Cancer - Prostate Father 33       deceased 67  . Heart attack Brother   . Hyperlipidemia Brother   . Other Brother        Schizophrenic  . Thyroid disease Sister   . Hemochromatosis Sister   . Rheum arthritis Sister   . Coronary artery disease Maternal Aunt   . Heart attack Maternal Aunt   . Cancer Maternal Grandmother        unk. type; deceased 109s  . Diabetes Neg Hx   . Colon cancer Neg Hx   . Esophageal cancer Neg Hx   . Rectal cancer Neg Hx   . Stomach cancer Neg Hx     His Social History Is Significant For: Social History   Socioeconomic History  . Marital status: Significant Other    Spouse name: Not on file  . Number of children: 2  . Years of education: 76  . Highest education level: Not on file  Occupational History  . 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    Comment: self employed  Social Needs  . Financial resource strain: Not hard at all  . Food insecurity:    Worry: Never true    Inability: Never true  . Transportation needs:    Medical: No    Non-medical: No  Tobacco Use  . Smoking status: Never Smoker  . Smokeless tobacco: Never Used  Substance and Sexual Activity  . Alcohol use: No    Alcohol/week: 0.0 oz    Comment: "I do not drink anymore"  . Drug use: No  . Sexual activity: Yes    Partners: Female  Lifestyle  . Physical activity:    Days per week: 0 days    Minutes per session: 0 min  . Stress: Very much  Relationships  . Social connections:    Talks on phone: More than three times a week  Gets together: More than three times a week    Attends religious service: More than 4 times per year    Active member of club or organization: Yes    Attends meetings of clubs or organizations: More than 4 times per year    Relationship status: Living with partner  Other Topics Concern  . Not on file  Social History Narrative   [employer] [place]. Married 1972-16yr  seperated/divorced. Engaged April '11. 1 son '74; step-daughter '70. 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 During '11 discovered an [employer]- $500,000+ loss. Is handling this OK - will keep the business running.    His Allergies Are:  Allergies  Allergen Reactions  . Cefepime Hives and Shortness Of Breath  . Cephaeline Hives  . Morphine And Related Shortness Of Breath, Nausea And Vomiting, Swelling and Other (See Comments)    Agitation, tolerates dilaudid Other reaction(s): Other (See Comments) Agitation, tolerates dilaudid  . Penicillins Hives, Shortness Of Breath and Rash    Has patient had a PCN reaction causing immediate rash, facial/tongue/throat swelling, SOB or lightheadedness with hypotension: Yes Has patient had a PCN reaction causing severe rash involving mucus membranes or skin necrosis: Yes Has patient had a PCN reaction that required hospitalization No Has patient had a PCN reaction occurring within the last 10 years: No If all of the above answers are "NO", then may proceed with Cephalosporin use.   .Marland KitchenMorphine   . Oxycodone-Acetaminophen   . Doxycycline Rash  :   His Current Medications Are:  Outpatient Encounter Medications as of 10/15/2017  Medication Sig  . albuterol (PROVENTIL HFA;VENTOLIN HFA) 108 (90 Base) MCG/ACT inhaler Inhale 1-2 puffs into the lungs every 4 (four) hours as needed for wheezing or shortness of breath.  . alfuzosin (UROXATRAL) 10 MG 24 hr tablet Take 10 mg by mouth at bedtime.   . ALPRAZolam (XANAX) 0.5 MG tablet Take 1 tablet (0.5 mg total) by mouth 2 (two) times daily as needed. for anxiety  . Cholecalciferol (VITAMIN D3) 2000 UNITS capsule Take 1 capsule (2,000 Units total) by mouth daily.  . clopidogrel (PLAVIX) 75 MG tablet TAKE 1 TABLET BY MOUTH EVERY DAY WITH BREAKFAST  . COLCRYS 0.6 MG tablet Take 1 tablet by  mouth every evening.   . dronabinol (MARINOL) 5 MG capsule Take 1 capsule (5 mg total) by mouth 2 (two) times daily before a meal.  . FLUoxetine (PROZAC) 40 MG capsule TAKE 1 CAPSULE BY MOUTH TWICE DAILY  . fluticasone (FLONASE) 50 MCG/ACT nasal spray INSTILL 2 SPRAYS IN EACH NOSTRIL EVERY DAY  . gabapentin (NEURONTIN) 300 MG capsule Take 1 capsule (300 mg total) by mouth 2 (two) times daily.  .Marland KitchenHYDROmorphone (DILAUDID) 2 MG tablet Take 1 tablet (2 mg total) by mouth every 8 (eight) hours as needed for severe pain. Please fill on or after 12/07/17  . lidocaine-prilocaine (EMLA) cream Apply 1 application topically as needed.  . linaclotide (LINZESS) 145 MCG CAPS capsule Take 1 capsule (145 mcg total) by mouth daily before breakfast.  . losartan-hydrochlorothiazide (HYZAAR) 50-12.5 MG tablet TAKE 1 TABLET BY MOUTH DAILY  . Melatonin 5 MG TABS Take 5 mg by mouth at bedtime. Takes 10 mg at HS  . metoprolol tartrate (LOPRESSOR) 25 MG tablet Take 1 tablet (25 mg total) by mouth 2 (two) times daily.  . Omega-3 Fatty Acids (FISH OIL OMEGA-3) 1000 MG  CAPS Take 1,000 mg by mouth daily.   . ondansetron (ZOFRAN) 8 MG tablet Take 1 tablet (8 mg total) by mouth every 8 (eight) hours as needed for nausea or vomiting.  . pantoprazole (PROTONIX) 40 MG tablet Take 1 tablet (40 mg total) by mouth every evening.  . pravastatin (PRAVACHOL) 20 MG tablet Take 1 tablet (20 mg total) by mouth daily. (Patient taking differently: Take 20 mg by mouth every evening. )  . predniSONE (DELTASONE) 5 MG tablet Take 5 mg by mouth daily with breakfast.  . prochlorperazine (COMPAZINE) 10 MG tablet TAKE 1 TABLET(10 MG) BY MOUTH EVERY 6 HOURS AS NEEDED FOR NAUSEA OR VOMITING  . tadalafil (CIALIS) 5 MG tablet Take 5 mg by mouth as needed.  . traZODone (DESYREL) 100 MG tablet TAKE 1 TABLET(100 MG) BY MOUTH AT BEDTIME AS NEEDED FOR SLEEP  . vitamin B-12 (CYANOCOBALAMIN) 1000 MCG tablet Take 1,000 mcg by mouth daily.   . [DISCONTINUED]  mupirocin ointment (BACTROBAN) 2 % APP ON THE SKIN ONCE D   No facility-administered encounter medications on file as of 10/15/2017.   :  Review of Systems:  Out of a complete 14 point review of systems, all are reviewed and negative with the exception of these symptoms as listed below:  Review of Systems  Neurological:       Pt presents today to follow up on his neurological care. Pt reports that his headaches are not a problem any longer. Pt was diagnosed with melanoma but found out today that he is cancer free. Pt just wants a thorough neurological exam.    Objective:  Neurological Exam  Physical Exam Physical Examination:   Vitals:   10/15/17 1408  BP: 106/62  Pulse: 74   General Examination: The patient is a very pleasant 72 y.o. male in no acute distress. He appears deconditioned.   HEENT: Normocephalic, atraumatic, pupils are equal, round and reactive to light and accommodation. He is status post bilateral cataract repairs. Extraocular tracking is good without nystagmus seen. Hearing is grossly intact. Face is symmetric with normal facial animation and normal facial sensation. Speech is slightly dysarthric, not worse than before. slight hoarseness is noted. There is no lip, neck or jaw tremor. Neck is with limited FROM. Oropharynx exam reveals mild mouth dryness. No significant airway crowding is noted. Mallampati is class II. Tongue protrudes centrally and palate elevates symmetrically. Tongue movements are fine.   Chest: is clear to auscultation without wheezing, rhonchi or crackles noted.  Heart: sounds are regular and normal without murmurs, rubs or gallops noted.   Abdomen: is soft, non-tender and non-distended with normal bowel sounds appreciated on auscultation.  Extremities: There is no pitting edema in the distal lower extremities bilaterally, but prominent arthritic changes noticed particularly in both hands, left knee swelling but right knee is more tender. Very  limited shoulder mobility both sides.  Skin: is warm and dry with no trophic changes noted. Chronic bruising noted both forearms and hands.  Musculoskeletal: exam reveals some neck pain and decrease in range of motion in both shoulders, and R ankle.    Neurologically:  Mental status: The patient is awake, paying good attention. He is fully oriented, and able to give a fairly good history. His memory, attention, language and knowledge are appropriate today. He does not have any word finding difficulties.    Cranial nerves are as described above under HEENT exam. Motor exam: Thin bulk, global strength of 4+ out of 5. No tremor, reflexes are  1+ throughout. Fine motor skills are globally impaired, particularly in both hands because of arthritic changes and joint deformities.  Sensory exam is intact to light touch. Gait, station and balance: He stands up with mild difficulty and only requires one attempt. His stance is mildly wide-based. He walks without assistance and slightly wide-based with a slight limp.   Assessment and Plan:   In summary, [name] is a very pleasant 72 year old male with a complex medical history of hypertension, osteoarthritis, diverticulosis, hemachromatosis, coronary artery disease, s/p stent placements, allergic rhinitis, upper and lower back pain, disabling rheumatoid arthritis, malignant melanoma status post resection, status post chemotherapy and COPD, who presents for a followup appointment for his recurrent headaches and gait disorder. He feels that he has been stable, headaches have not been an ongoing issue thankfully. He has not had any recent falls or exacerbation of his balance disorder but does have risk factors for balance problems and falls. His rheumatoid arthritis has progressed. From the neurological standpoint he is stable and I did not suggest any new medications or new testing from my end of things. He does worry about his wife's health,  particularly her memory. She has an upcoming appointment with me later this month.  He is on narcotic pain medications and his medical history is quite complicated. In the past, he has had extensive workup. Paraneoplastic testing and screening for cancer in the past was negative, EEG normal, negative chest x-ray and negative CT abdomen and pelvis for any mass. He has a remote history of alcohol abuse and no longer drinks any alcohol at all (x 6 years, per his report). He lives with his wife, has 2 kids from before and his wife has 2 kids from before. At this juncture, I would like to see him back as needed. I answered all his questions today and he was in agreement. I spent 30 minutes in total face-to-face time with the patient, more than 50% of which was spent in counseling and coordination of care, reviewing test results, reviewing medication and discussing or reviewing the diagnosis of recurrent headaches and gait disorder, the prognosis and treatment options. Pertinent laboratory and imaging test results that were available during this visit with the patient were reviewed by me and considered in my medical decision making (see chart for details).

## 2017-10-15 NOTE — Patient Instructions (Signed)
You have remained stable neurologically, I am pleased to see.  I would not suggest any new medication from my end of things or new testing. I would be happy to see you back as needed at this point.

## 2017-10-16 ENCOUNTER — Encounter: Payer: Self-pay | Admitting: Hematology & Oncology

## 2017-10-16 ENCOUNTER — Inpatient Hospital Stay: Payer: Medicare Other

## 2017-10-16 ENCOUNTER — Other Ambulatory Visit: Payer: Self-pay

## 2017-10-16 ENCOUNTER — Inpatient Hospital Stay (HOSPITAL_BASED_OUTPATIENT_CLINIC_OR_DEPARTMENT_OTHER): Payer: Medicare Other | Admitting: Hematology & Oncology

## 2017-10-16 VITALS — BP 115/64 | HR 66 | Temp 97.6°F | Resp 20

## 2017-10-16 DIAGNOSIS — C4372 Malignant melanoma of left lower limb, including hip: Secondary | ICD-10-CM

## 2017-10-16 DIAGNOSIS — Z5112 Encounter for antineoplastic immunotherapy: Secondary | ICD-10-CM | POA: Diagnosis not present

## 2017-10-16 DIAGNOSIS — Z452 Encounter for adjustment and management of vascular access device: Secondary | ICD-10-CM | POA: Diagnosis not present

## 2017-10-16 LAB — CMP (CANCER CENTER ONLY)
ALT: 14 U/L (ref 10–47)
AST: 15 U/L (ref 11–38)
Albumin: 3.4 g/dL — ABNORMAL LOW (ref 3.5–5.0)
Alkaline Phosphatase: 62 U/L (ref 26–84)
Anion gap: 5 (ref 5–15)
BUN: 16 mg/dL (ref 7–22)
CO2: 32 mmol/L (ref 18–33)
Calcium: 8.7 mg/dL (ref 8.0–10.3)
Chloride: 94 mmol/L — ABNORMAL LOW (ref 98–108)
Creatinine: 1 mg/dL (ref 0.60–1.20)
Glucose, Bld: 89 mg/dL (ref 73–118)
Potassium: 3.7 mmol/L (ref 3.3–4.7)
Sodium: 131 mmol/L (ref 128–145)
Total Bilirubin: 0.7 mg/dL (ref 0.2–1.6)
Total Protein: 6 g/dL — ABNORMAL LOW (ref 6.4–8.1)

## 2017-10-16 LAB — CBC WITH DIFFERENTIAL (CANCER CENTER ONLY)
Basophils Absolute: 0 10*3/uL (ref 0.0–0.1)
Basophils Relative: 1 %
Eosinophils Absolute: 0 10*3/uL (ref 0.0–0.5)
Eosinophils Relative: 0 %
HCT: 34.6 % — ABNORMAL LOW (ref 38.7–49.9)
Hemoglobin: 11.5 g/dL — ABNORMAL LOW (ref 13.0–17.1)
Lymphocytes Relative: 23 %
Lymphs Abs: 1.2 10*3/uL (ref 0.9–3.3)
MCH: 30.6 pg (ref 28.0–33.4)
MCHC: 33.2 g/dL (ref 32.0–35.9)
MCV: 92 fL (ref 82.0–98.0)
Monocytes Absolute: 0.8 10*3/uL (ref 0.1–0.9)
Monocytes Relative: 15 %
Neutro Abs: 3.1 10*3/uL (ref 1.5–6.5)
Neutrophils Relative %: 61 %
Platelet Count: 202 10*3/uL (ref 145–400)
RBC: 3.76 MIL/uL — ABNORMAL LOW (ref 4.20–5.70)
RDW: 13.3 % (ref 11.1–15.7)
WBC Count: 5.1 10*3/uL (ref 4.0–10.0)

## 2017-10-16 LAB — LACTATE DEHYDROGENASE: LDH: 154 U/L (ref 98–192)

## 2017-10-16 MED ORDER — SODIUM CHLORIDE 0.9 % IV SOLN
480.0000 mg | Freq: Once | INTRAVENOUS | Status: AC
  Administered 2017-10-16: 480 mg via INTRAVENOUS
  Filled 2017-10-16: qty 48

## 2017-10-16 MED ORDER — SODIUM CHLORIDE 0.9 % IV SOLN
Freq: Once | INTRAVENOUS | Status: AC
Start: 2017-10-16 — End: 2017-10-16
  Administered 2017-10-16: 13:00:00 via INTRAVENOUS

## 2017-10-16 MED ORDER — SODIUM CHLORIDE 0.9% FLUSH
10.0000 mL | INTRAVENOUS | Status: DC | PRN
  Administered 2017-10-16: 10 mL
  Filled 2017-10-16: qty 10

## 2017-10-16 MED ORDER — HEPARIN SOD (PORK) LOCK FLUSH 100 UNIT/ML IV SOLN
500.0000 [IU] | Freq: Once | INTRAVENOUS | Status: AC | PRN
  Administered 2017-10-16: 500 [IU]
  Filled 2017-10-16: qty 5

## 2017-10-16 NOTE — Progress Notes (Signed)
Hematology and Oncology Follow Up Visit  [name] [id number] 09-28-1945 72 y.o. 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  He is having more problems with his back.  We did go ahead and get some scans on him as part of his melanoma surveillance.  Thankfully, the scans did not show any recurrence of the melanoma.  However, he has a "failed hardware system" in the lumbar/sacral spine.  He has a fractured S1 pedicle screw.  He has a fractured right L4 pedicle screw.  I am not sure what this means but he clearly will need to get back to neurosurgery for this.  Otherwise, he is doing well with the melanoma.  He has had no problems with the nivolumab.  He also has hemochromatosis.  We actually have to give him iron on occasion when he is really feeling worn out and fatigued.  When we last checked his iron levels in June, his ferritin was 66 with an iron saturation of 28%.  His appetite is doing okay.  Overall, his performance status is ECOG 2.  Medications:  Allergies as of 10/16/2017      Reactions   Cefepime Hives, Shortness Of Breath   Cephaeline Hives   Morphine And Related Shortness Of Breath, Nausea And Vomiting, Swelling, Other (See Comments)   Agitation, tolerates dilaudid Other reaction(s): Other (See Comments) Agitation, tolerates dilaudid   Penicillins Hives, Shortness Of Breath, Rash   Has patient had a PCN reaction causing immediate rash, facial/tongue/throat swelling, SOB or lightheadedness with hypotension: Yes Has patient had a PCN reaction causing severe rash involving mucus membranes or skin necrosis:  Yes Has patient had a PCN reaction that required hospitalization No Has patient had a PCN reaction occurring within the last 10 years: No If all of the above answers are "NO", then may proceed with Cephalosporin use.   Morphine    Oxycodone-acetaminophen    Doxycycline Rash      Medication List        Accurate as of 10/16/17 12:32 PM. Always use your most recent med list.          albuterol 108 (90 Base) MCG/ACT inhaler Commonly known as:  PROVENTIL HFA;VENTOLIN HFA Inhale 1-2 puffs into the lungs every 4 (four) hours as needed for wheezing or shortness of breath.   alfuzosin 10 MG 24 hr tablet Commonly known as:  UROXATRAL Take 10 mg by mouth at bedtime.   ALPRAZolam 0.5 MG tablet Commonly known as:  XANAX Take 1 tablet (0.5 mg total) by mouth 2 (two) times daily as needed. for anxiety   CIALIS 5 MG tablet Generic drug:  tadalafil Take 5 mg by mouth as needed.   clopidogrel 75 MG tablet Commonly known as:  PLAVIX TAKE 1 TABLET BY MOUTH EVERY DAY WITH BREAKFAST   COLCRYS 0.6 MG tablet Generic drug:  colchicine Take 1 tablet by mouth every evening.   dronabinol 5 MG capsule Commonly known as:  MARINOL Take 1 capsule (5 mg total) by mouth 2 (two) times daily before a meal.   FISH OIL OMEGA-3 1000 MG Caps Take 1,000 mg by mouth daily.   FLUoxetine 40  MG capsule Commonly known as:  PROZAC TAKE 1 CAPSULE BY MOUTH TWICE DAILY   fluticasone 50 MCG/ACT nasal spray Commonly known as:  FLONASE INSTILL 2 SPRAYS IN EACH NOSTRIL EVERY DAY   gabapentin 300 MG capsule Commonly known as:  NEURONTIN Take 1 capsule (300 mg total) by mouth 2 (two) times daily.   HYDROmorphone 2 MG tablet Commonly known as:  DILAUDID Take 1 tablet (2 mg total) by mouth every 8 (eight) hours as needed for severe pain. Please fill on or after 12/07/17   lidocaine-prilocaine cream Commonly known as:  EMLA Apply 1 application topically as needed.   linaclotide 145 MCG Caps capsule Commonly  known as:  LINZESS Take 1 capsule (145 mcg total) by mouth daily before breakfast.   losartan-hydrochlorothiazide 50-12.5 MG tablet Commonly known as:  HYZAAR TAKE 1 TABLET BY MOUTH DAILY   Melatonin 5 MG Tabs Take 5 mg by mouth at bedtime. Takes 10 mg at HS   metoprolol tartrate 25 MG tablet Commonly known as:  LOPRESSOR Take 1 tablet (25 mg total) by mouth 2 (two) times daily.   ondansetron 8 MG tablet Commonly known as:  ZOFRAN Take 1 tablet (8 mg total) by mouth every 8 (eight) hours as needed for nausea or vomiting.   pantoprazole 40 MG tablet Commonly known as:  PROTONIX Take 1 tablet (40 mg total) by mouth every evening.   pravastatin 20 MG tablet Commonly known as:  PRAVACHOL Take 1 tablet (20 mg total) by mouth daily.   predniSONE 5 MG tablet Commonly known as:  DELTASONE Take 5 mg by mouth daily with breakfast.   prochlorperazine 10 MG tablet Commonly known as:  COMPAZINE TAKE 1 TABLET(10 MG) BY MOUTH EVERY 6 HOURS AS NEEDED FOR NAUSEA OR VOMITING   traZODone 100 MG tablet Commonly known as:  DESYREL TAKE 1 TABLET(100 MG) BY MOUTH AT BEDTIME AS NEEDED FOR SLEEP   vitamin B-12 1000 MCG tablet Commonly known as:  CYANOCOBALAMIN Take 1,000 mcg by mouth daily.   Vitamin D3 2000 units capsule Take 1 capsule (2,000 Units total) by mouth daily.       Allergies:  Allergies  Allergen Reactions  . Cefepime Hives and Shortness Of Breath  . Cephaeline Hives  . Morphine And Related Shortness Of Breath, Nausea And Vomiting, Swelling and Other (See Comments)    Agitation, tolerates dilaudid Other reaction(s): Other (See Comments) Agitation, tolerates dilaudid  . Penicillins Hives, Shortness Of Breath and Rash    Has patient had a PCN reaction causing immediate rash, facial/tongue/throat swelling, SOB or lightheadedness with hypotension: Yes Has patient had a PCN reaction causing severe rash involving mucus membranes or skin necrosis: Yes Has patient had a PCN  reaction that required hospitalization No Has patient had a PCN reaction occurring within the last 10 years: No If all of the above answers are "NO", then may proceed with Cephalosporin use.   [name] Morphine   . Oxycodone-Acetaminophen   . Doxycycline Rash    Past Medical History, Surgical history, Social history, and Family History were reviewed and updated.  Review of Systems: Review of Systems  Constitutional: Positive for malaise/fatigue and weight loss.  HENT: Negative.   Eyes: Negative.   Respiratory: Negative.   Cardiovascular: Negative.   Gastrointestinal: Positive for diarrhea and nausea.  Genitourinary: Negative.   Musculoskeletal: Positive for joint pain and myalgias.  Skin: Negative.   Neurological: Negative.   Endo/Heme/Allergies: Negative.   Psychiatric/Behavioral: Negative.      Physical Exam:  oral temperature is 97.6 F (36.4 C). His blood pressure is 115/64 and his pulse is 66. His respiration is 20 and oxygen saturation is 100%.   Wt Readings from Last 3 Encounters:  10/15/17 145 lb (65.8 kg)  09/30/17 137 lb (62.1 kg)  09/18/17 138 lb (62.6 kg)    Physical Exam  Constitutional: He is oriented to person, place, and time.  HENT:  Head: Normocephalic and atraumatic.  Mouth/Throat: Oropharynx is clear and moist.  Eyes: Pupils are equal, round, and reactive to light. EOM are normal.  Neck: Normal range of motion.  Cardiovascular: Normal rate, regular rhythm and normal heart sounds.  Pulmonary/Chest: Effort normal and breath sounds normal.  Abdominal: Soft. Bowel sounds are normal.  Musculoskeletal: Normal range of motion. He exhibits no edema, tenderness or deformity.  Lymphadenopathy:    He has no cervical adenopathy.  Neurological: He is alert and oriented to person, place, and time.  Skin: Skin is warm and dry. No rash noted. No erythema.  Psychiatric: He has a normal mood and affect. His behavior is normal. Judgment and thought content normal.   Vitals reviewed.    Lab Results  Component Value Date   WBC 5.1 10/16/2017   HGB 11.5 (L) 10/16/2017   HCT 34.6 (L) 10/16/2017   MCV 92.0 10/16/2017   PLT 202 10/16/2017   Lab Results  Component Value Date   FERRITIN 66 09/18/2017   IRON 50 09/18/2017   TIBC 180 (L) 09/18/2017   UIBC 130 09/18/2017   IRONPCTSAT 28 (L) 09/18/2017   Lab Results  Component Value Date   RETICCTPCT 1.9 03/24/2013   RBC 3.76 (L) 10/16/2017   No results found for: KPAFRELGTCHN, LAMBDASER, KAPLAMBRATIO No results found for: IGGSERUM, IGA, IGMSERUM No results found for: Odetta Pink, SPEI   Chemistry      Component Value Date/Time   NA 131 10/16/2017 1035   NA 140 04/10/2017 1046   NA 135 (L) 03/06/2017 1104   K 3.7 10/16/2017 1035   K 3.7 04/10/2017 1046   K 4.2 03/06/2017 1104   CL 94 (L) 10/16/2017 1035   CL 98 04/10/2017 1046   CO2 32 10/16/2017 1035   CO2 31 04/10/2017 1046   CO2 30 (H) 03/06/2017 1104   BUN 16 10/16/2017 1035   BUN 21 04/10/2017 1046   BUN 20.0 03/06/2017 1104   CREATININE 1.00 10/16/2017 1035   CREATININE 0.9 04/10/2017 1046   CREATININE 1.0 03/06/2017 1104      Component Value Date/Time   CALCIUM 8.7 10/16/2017 1035   CALCIUM 8.9 04/10/2017 1046   CALCIUM 9.4 03/06/2017 1104   ALKPHOS 62 10/16/2017 1035   ALKPHOS 65 04/10/2017 1046   ALKPHOS 78 03/06/2017 1104   AST 15 10/16/2017 1035   AST 20 03/06/2017 1104   ALT 14 10/16/2017 1035   ALT 21 04/10/2017 1046   ALT 20 03/06/2017 1104   BILITOT 0.7 10/16/2017 1035   BILITOT 0.47 03/06/2017 1104      Impression and Plan: [name] is a very pleasant 72 yo caucasian gentleman with history of hemochromatosis --  homozygous for C282Y mutation.  This, for right now, is a secondary issue.  However, we do had to be cautious with his iron levels.  He now has stage IIIc (T2N2M0) melanoma of the left thigh. This was excised in November 2018.  Again,  the melanoma is not an issue.  We will continue him on the nivolumab.  He will complete his year of therapy in April 2020.  We will get him over to neurosurgery.  Again, we will make a referral for him I will call Dr. Earle Gell.  We will plan to see him back in 1 month.   [name], MD 7/11/201912:32 PM

## 2017-10-17 LAB — FERRITIN: Ferritin: 260 ng/mL (ref 24–336)

## 2017-10-17 LAB — IRON AND TIBC
Iron: 100 ug/dL (ref 42–163)
Saturation Ratios: 59 % (ref 42–163)
TIBC: 169 ug/dL — ABNORMAL LOW (ref 202–409)
UIBC: 69 ug/dL

## 2017-10-20 ENCOUNTER — Telehealth: Payer: Self-pay | Admitting: Internal Medicine

## 2017-10-20 MED ORDER — GABAPENTIN 300 MG PO CAPS: 300.0000 mg | ORAL_CAPSULE | Freq: Two times a day (BID) | ORAL | 1 refills | Status: DC

## 2017-10-20 NOTE — Telephone Encounter (Signed)
Reviewed chart pt is up-to-date sent refills to [facility].Marland KitchenJohny Chess

## 2017-10-20 NOTE — Telephone Encounter (Signed)
Copied from Luis M. Cintron [id number]. Topic: Quick Communication - Rx Refill/Question >> Oct 20, 2017 12:02 PM [name] wrote: Medication: gabapentin (NEURONTIN) 300 MG capsule, requesting to change to 3 tabs daily  Has the patient contacted their pharmacy? Yes.   (Agent: If no, request that the patient contact the pharmacy for the refill.) (Agent: If yes, when and what did the pharmacy advise?)  Preferred Pharmacy (with phone number or street name): [facility]  Agent: Please be advised that RX refills may take up to 3 business days. We ask that you follow-up with your pharmacy.

## 2017-10-24 DIAGNOSIS — D1801 Hemangioma of skin and subcutaneous tissue: Secondary | ICD-10-CM | POA: Diagnosis not present

## 2017-10-24 DIAGNOSIS — L720 Epidermal cyst: Secondary | ICD-10-CM | POA: Diagnosis not present

## 2017-10-24 DIAGNOSIS — L570 Actinic keratosis: Secondary | ICD-10-CM | POA: Diagnosis not present

## 2017-10-24 DIAGNOSIS — Z85820 Personal history of malignant melanoma of skin: Secondary | ICD-10-CM | POA: Diagnosis not present

## 2017-10-24 DIAGNOSIS — D692 Other nonthrombocytopenic purpura: Secondary | ICD-10-CM | POA: Diagnosis not present

## 2017-10-27 NOTE — Telephone Encounter (Signed)
Pt calling as the pharmacy received gabapentin refill for 2/day. Pt is requesting increase to 3/day. Please call to advise.

## 2017-10-28 NOTE — Telephone Encounter (Signed)
pls stay on bid Thx

## 2017-10-29 ENCOUNTER — Other Ambulatory Visit: Payer: Self-pay | Admitting: Internal Medicine

## 2017-10-29 NOTE — Telephone Encounter (Signed)
Copied from [place] [id number]. Topic: Quick Communication - Rx Refill/Question >> Oct 29, 2017  3:36 PM Waldemar Dickens, Sade R wrote: Medication:traZODone (DESYREL) 100 MG tablet  Has the patient contacted their pharmacy?Yes, was advised no refills available   Preferred Pharmacy (with phone number or street name): [facility] [place], [place] [phone] (Phone) [phone] (Fax)      Agent: Please be advised that RX refills may take up to 3 business days. We ask that you follow-up with your pharmacy.

## 2017-10-30 DIAGNOSIS — M47812 Spondylosis without myelopathy or radiculopathy, cervical region: Secondary | ICD-10-CM | POA: Diagnosis not present

## 2017-10-31 MED ORDER — TRAZODONE HCL 100 MG PO TABS: ORAL_TABLET | ORAL | 2 refills | Status: DC

## 2017-10-31 NOTE — Telephone Encounter (Signed)
LOV 09/30/17 Dr. Alain Marion Last refill 09/30/17 # 30 with 0 refill

## 2017-10-31 NOTE — Telephone Encounter (Signed)
Reviewed chart pt is up-to-date sent refills to [facility].Marland KitchenJohny Chess

## 2017-11-13 ENCOUNTER — Inpatient Hospital Stay: Payer: Medicare Other | Attending: Hematology & Oncology | Admitting: Hematology & Oncology

## 2017-11-13 ENCOUNTER — Other Ambulatory Visit: Payer: Self-pay

## 2017-11-13 ENCOUNTER — Encounter: Payer: Self-pay | Admitting: Hematology & Oncology

## 2017-11-13 ENCOUNTER — Inpatient Hospital Stay: Payer: Medicare Other

## 2017-11-13 VITALS — BP 113/56 | HR 57 | Temp 97.7°F | Resp 18 | Wt 137.0 lb

## 2017-11-13 DIAGNOSIS — C4372 Malignant melanoma of left lower limb, including hip: Secondary | ICD-10-CM

## 2017-11-13 DIAGNOSIS — E876 Hypokalemia: Secondary | ICD-10-CM | POA: Insufficient documentation

## 2017-11-13 DIAGNOSIS — J449 Chronic obstructive pulmonary disease, unspecified: Secondary | ICD-10-CM | POA: Insufficient documentation

## 2017-11-13 DIAGNOSIS — M7989 Other specified soft tissue disorders: Secondary | ICD-10-CM | POA: Insufficient documentation

## 2017-11-13 DIAGNOSIS — Z5112 Encounter for antineoplastic immunotherapy: Secondary | ICD-10-CM | POA: Insufficient documentation

## 2017-11-13 DIAGNOSIS — R197 Diarrhea, unspecified: Secondary | ICD-10-CM | POA: Diagnosis not present

## 2017-11-13 DIAGNOSIS — R112 Nausea with vomiting, unspecified: Secondary | ICD-10-CM | POA: Diagnosis not present

## 2017-11-13 LAB — CBC WITH DIFFERENTIAL (CANCER CENTER ONLY)
Basophils Absolute: 0 10*3/uL (ref 0.0–0.1)
Basophils Relative: 0 %
Eosinophils Absolute: 0 10*3/uL (ref 0.0–0.5)
Eosinophils Relative: 0 %
HCT: 37.7 % — ABNORMAL LOW (ref 38.7–49.9)
Hemoglobin: 12.7 g/dL — ABNORMAL LOW (ref 13.0–17.1)
Lymphocytes Relative: 18 %
Lymphs Abs: 1.4 10*3/uL (ref 0.9–3.3)
MCH: 31.1 pg (ref 28.0–33.4)
MCHC: 33.7 g/dL (ref 32.0–35.9)
MCV: 92.4 fL (ref 82.0–98.0)
Monocytes Absolute: 1.1 10*3/uL — ABNORMAL HIGH (ref 0.1–0.9)
Monocytes Relative: 13 %
Neutro Abs: 5.4 10*3/uL (ref 1.5–6.5)
Neutrophils Relative %: 69 %
Platelet Count: 218 10*3/uL (ref 145–400)
RBC: 4.08 MIL/uL — ABNORMAL LOW (ref 4.20–5.70)
RDW: 13.4 % (ref 11.1–15.7)
WBC Count: 7.9 10*3/uL (ref 4.0–10.0)

## 2017-11-13 LAB — CMP (CANCER CENTER ONLY)
ALT: 17 U/L (ref 10–47)
AST: 18 U/L (ref 11–38)
Albumin: 3.1 g/dL — ABNORMAL LOW (ref 3.5–5.0)
Alkaline Phosphatase: 53 U/L (ref 26–84)
Anion gap: 1 — ABNORMAL LOW (ref 5–15)
BUN: 17 mg/dL (ref 7–22)
CO2: 32 mmol/L (ref 18–33)
Calcium: 9.1 mg/dL (ref 8.0–10.3)
Chloride: 96 mmol/L — ABNORMAL LOW (ref 98–108)
Creatinine: 1 mg/dL (ref 0.60–1.20)
Glucose, Bld: 94 mg/dL (ref 73–118)
Potassium: 3.8 mmol/L (ref 3.3–4.7)
Sodium: 129 mmol/L (ref 128–145)
Total Bilirubin: 0.6 mg/dL (ref 0.2–1.6)
Total Protein: 6 g/dL — ABNORMAL LOW (ref 6.4–8.1)

## 2017-11-13 LAB — LACTATE DEHYDROGENASE: LDH: 136 U/L (ref 98–192)

## 2017-11-13 MED ORDER — SODIUM CHLORIDE 0.9% FLUSH
10.0000 mL | INTRAVENOUS | Status: DC | PRN
  Administered 2017-11-13: 10 mL
  Filled 2017-11-13: qty 10

## 2017-11-13 MED ORDER — SODIUM CHLORIDE 0.9 % IV SOLN
Freq: Once | INTRAVENOUS | Status: AC
  Administered 2017-11-13: 13:00:00 via INTRAVENOUS
  Filled 2017-11-13: qty 250

## 2017-11-13 MED ORDER — HEPARIN SOD (PORK) LOCK FLUSH 100 UNIT/ML IV SOLN
500.0000 [IU] | Freq: Once | INTRAVENOUS | Status: AC | PRN
  Administered 2017-11-13: 500 [IU]
  Filled 2017-11-13: qty 5

## 2017-11-13 MED ORDER — SODIUM CHLORIDE 0.9 % IV SOLN
480.0000 mg | Freq: Once | INTRAVENOUS | Status: AC
  Administered 2017-11-13: 480 mg via INTRAVENOUS
  Filled 2017-11-13: qty 48

## 2017-11-13 MED ORDER — SODIUM CHLORIDE 0.9% FLUSH
3.0000 mL | INTRAVENOUS | Status: DC | PRN
  Filled 2017-11-13: qty 10

## 2017-11-13 NOTE — Patient Instructions (Signed)
Implanted Port Home Guide An implanted port is a type of central line that is placed under the skin. Central lines are used to provide IV access when treatment or nutrition needs to be given through a person's veins. Implanted ports are used for long-term IV access. An implanted port may be placed because:  You need IV medicine that would be irritating to the small veins in your hands or arms.  You need long-term IV medicines, such as antibiotics.  You need IV nutrition for a long period.  You need frequent blood draws for lab tests.  You need dialysis.  Implanted ports are usually placed in the chest area, but they can also be placed in the upper arm, the abdomen, or the leg. An implanted port has two main parts:  Reservoir. The reservoir is round and will appear as a small, raised area under your skin. The reservoir is the part where a needle is inserted to give medicines or draw blood.  Catheter. The catheter is a thin, flexible tube that extends from the reservoir. The catheter is placed into a large vein. Medicine that is inserted into the reservoir goes into the catheter and then into the vein.  How will I care for my incision site? Do not get the incision site wet. Bathe or shower as directed by your health care provider. How is my port accessed? Special steps must be taken to access the port:  Before the port is accessed, a numbing cream can be placed on the skin. This helps numb the skin over the port site.  Your health care provider uses a sterile technique to access the port. ? Your health care provider must put on a mask and sterile gloves. ? The skin over your port is cleaned carefully with an antiseptic and allowed to dry. ? The port is gently pinched between sterile gloves, and a needle is inserted into the port.  Only "non-coring" port needles should be used to access the port. Once the port is accessed, a blood return should be checked. This helps ensure that the port  is in the vein and is not clogged.  If your port needs to remain accessed for a constant infusion, a clear (transparent) bandage will be placed over the needle site. The bandage and needle will need to be changed every week, or as directed by your health care provider.  Keep the bandage covering the needle clean and dry. Do not get it wet. Follow your health care provider's instructions on how to take a shower or bath while the port is accessed.  If your port does not need to stay accessed, no bandage is needed over the port.  What is flushing? Flushing helps keep the port from getting clogged. Follow your health care provider's instructions on how and when to flush the port. Ports are usually flushed with saline solution or a medicine called heparin. The need for flushing will depend on how the port is used.  If the port is used for intermittent medicines or blood draws, the port will need to be flushed: ? After medicines have been given. ? After blood has been drawn. ? As part of routine maintenance.  If a constant infusion is running, the port may not need to be flushed.  How long will my port stay implanted? The port can stay in for as long as your health care provider thinks it is needed. When it is time for the port to come out, surgery will be   done to remove it. The procedure is similar to the one performed when the port was put in. When should I seek immediate medical care? When you have an implanted port, you should seek immediate medical care if:  You notice a bad smell coming from the incision site.  You have swelling, redness, or drainage at the incision site.  You have more swelling or pain at the port site or the surrounding area.  You have a fever that is not controlled with medicine.  This information is not intended to replace advice given to you by your health care provider. Make sure you discuss any questions you have with your health care provider. Document  Released: 03/25/2005 Document Revised: 08/31/2015 Document Reviewed: 11/30/2012 Elsevier Interactive Patient Education  2017 [employer]  

## 2017-11-13 NOTE — Progress Notes (Signed)
Hematology and Oncology Follow Up Visit  [name] [id number] August 01, 1945 72 y.o. 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  He is having more problems with his back.  We did go ahead and get some scans on him as part of his melanoma surveillance.  Thankfully, the scans did not show any recurrence of the melanoma.  However, he has a "failed hardware system" in the lumbar/sacral spine.  He has a fractured S1 pedicle screw.  He has a fractured right L4 pedicle screw.  I am not sure what this means but he clearly will need to get back to neurosurgery for this.  Otherwise, he is doing well with the melanoma.  He has had no problems with the nivolumab.  He also has hemochromatosis.  We actually have to give him iron on occasion when he is really feeling worn out and fatigued.  When we last checked his iron levels in June, his ferritin was 66 with an iron saturation of 28%.  His appetite is doing okay.  Overall, his performance status is ECOG 2.  Medications:  Allergies as of 11/13/2017      Reactions   Cefepime Hives, Shortness Of Breath   Cephaeline Hives   Morphine And Related Shortness Of Breath, Nausea And Vomiting, Swelling, Other (See Comments)   Agitation, tolerates dilaudid Other reaction(s): Other (See Comments) Agitation, tolerates dilaudid   Penicillins Hives, Shortness Of Breath, Rash   Has patient had a PCN reaction causing immediate rash, facial/tongue/throat swelling, SOB or lightheadedness with hypotension: Yes Has patient had a PCN reaction causing severe rash involving mucus  membranes or skin necrosis: Yes Has patient had a PCN reaction that required hospitalization No Has patient had a PCN reaction occurring within the last 10 years: No If all of the above answers are "NO", then may proceed with Cephalosporin use.   Morphine    Oxycodone-acetaminophen    Doxycycline Rash      Medication List        Accurate as of 11/13/17 11:48 AM. Always use your most recent med list.          albuterol 108 (90 Base) MCG/ACT inhaler Commonly known as:  PROVENTIL HFA;VENTOLIN HFA Inhale 1-2 puffs into the lungs every 4 (four) hours as needed for wheezing or shortness of breath.   alfuzosin 10 MG 24 hr tablet Commonly known as:  UROXATRAL Take 10 mg by mouth at bedtime.   ALPRAZolam 0.5 MG tablet Commonly known as:  XANAX Take 1 tablet (0.5 mg total) by mouth 2 (two) times daily as needed. for anxiety   CIALIS 5 MG tablet Generic drug:  tadalafil Take 5 mg by mouth as needed.   clopidogrel 75 MG tablet Commonly known as:  PLAVIX TAKE 1 TABLET BY MOUTH EVERY DAY WITH BREAKFAST   COLCRYS 0.6 MG tablet Generic drug:  colchicine Take 1 tablet by mouth every evening.   dronabinol 5 MG capsule Commonly known as:  MARINOL Take 1 capsule (5 mg total) by mouth 2 (two) times daily before a meal.   FISH OIL OMEGA-3 1000 MG Caps Take 1,000 mg by mouth  daily.   FLUoxetine 40 MG capsule Commonly known as:  PROZAC TAKE 1 CAPSULE BY MOUTH TWICE DAILY   fluticasone 50 MCG/ACT nasal spray Commonly known as:  FLONASE INSTILL 2 SPRAYS IN EACH NOSTRIL EVERY DAY   gabapentin 300 MG capsule Commonly known as:  NEURONTIN Take 1 capsule (300 mg total) by mouth 2 (two) times daily.   HYDROmorphone 2 MG tablet Commonly known as:  DILAUDID Take 1 tablet (2 mg total) by mouth every 8 (eight) hours as needed for severe pain. Please fill on or after 12/07/17   lidocaine-prilocaine cream Commonly known as:  EMLA Apply 1 application topically as needed.   linaclotide 145  MCG Caps capsule Commonly known as:  LINZESS Take 1 capsule (145 mcg total) by mouth daily before breakfast.   losartan-hydrochlorothiazide 50-12.5 MG tablet Commonly known as:  HYZAAR TAKE 1 TABLET BY MOUTH DAILY   Melatonin 5 MG Tabs Take 5 mg by mouth at bedtime. Takes 10 mg at HS   metoprolol tartrate 25 MG tablet Commonly known as:  LOPRESSOR Take 1 tablet (25 mg total) by mouth 2 (two) times daily.   ondansetron 8 MG tablet Commonly known as:  ZOFRAN Take 1 tablet (8 mg total) by mouth every 8 (eight) hours as needed for nausea or vomiting.   pantoprazole 40 MG tablet Commonly known as:  PROTONIX Take 1 tablet (40 mg total) by mouth every evening.   pravastatin 20 MG tablet Commonly known as:  PRAVACHOL Take 1 tablet (20 mg total) by mouth daily.   predniSONE 5 MG tablet Commonly known as:  DELTASONE Take 5 mg by mouth daily with breakfast.   prochlorperazine 10 MG tablet Commonly known as:  COMPAZINE TAKE 1 TABLET(10 MG) BY MOUTH EVERY 6 HOURS AS NEEDED FOR NAUSEA OR VOMITING   traZODone 100 MG tablet Commonly known as:  DESYREL TAKE 1 TABLET(100 MG) BY MOUTH AT BEDTIME AS NEEDED FOR SLEEP   vitamin B-12 1000 MCG tablet Commonly known as:  CYANOCOBALAMIN Take 1,000 mcg by mouth daily.   Vitamin D3 2000 units capsule Take 1 capsule (2,000 Units total) by mouth daily.       Allergies:  Allergies  Allergen Reactions  . Cefepime Hives and Shortness Of Breath  . Cephaeline Hives  . Morphine And Related Shortness Of Breath, Nausea And Vomiting, Swelling and Other (See Comments)    Agitation, tolerates dilaudid Other reaction(s): Other (See Comments) Agitation, tolerates dilaudid  . Penicillins Hives, Shortness Of Breath and Rash    Has patient had a PCN reaction causing immediate rash, facial/tongue/throat swelling, SOB or lightheadedness with hypotension: Yes Has patient had a PCN reaction causing severe rash involving mucus membranes or skin necrosis:  Yes Has patient had a PCN reaction that required hospitalization No Has patient had a PCN reaction occurring within the last 10 years: No If all of the above answers are "NO", then may proceed with Cephalosporin use.   [name] Morphine   . Oxycodone-Acetaminophen   . Doxycycline Rash    Past Medical History, Surgical history, Social history, and Family History were reviewed and updated.  Review of Systems: Review of Systems  Constitutional: Positive for malaise/fatigue and weight loss.  HENT: Negative.   Eyes: Negative.   Respiratory: Negative.   Cardiovascular: Negative.   Gastrointestinal: Positive for diarrhea and nausea.  Genitourinary: Negative.   Musculoskeletal: Positive for joint pain and myalgias.  Skin: Negative.   Neurological: Negative.   Endo/Heme/Allergies: Negative.   Psychiatric/Behavioral: Negative.  Physical Exam:  weight is 137 lb (62.1 kg). His oral temperature is 97.7 F (36.5 C). His blood pressure is 113/56 (abnormal) and his pulse is 57 (abnormal). His respiration is 18 and oxygen saturation is 100%.   Wt Readings from Last 3 Encounters:  11/13/17 137 lb (62.1 kg)  10/15/17 145 lb (65.8 kg)  09/30/17 137 lb (62.1 kg)    Physical Exam  Constitutional: He is oriented to person, place, and time.  HENT:  Head: Normocephalic and atraumatic.  Mouth/Throat: Oropharynx is clear and moist.  Eyes: Pupils are equal, round, and reactive to light. EOM are normal.  Neck: Normal range of motion.  Cardiovascular: Normal rate, regular rhythm and normal heart sounds.  Pulmonary/Chest: Effort normal and breath sounds normal.  Abdominal: Soft. Bowel sounds are normal.  Musculoskeletal: Normal range of motion. He exhibits no edema, tenderness or deformity.  Lymphadenopathy:    He has no cervical adenopathy.  Neurological: He is alert and oriented to person, place, and time.  Skin: Skin is warm and dry. No rash noted. No erythema.  Psychiatric: He has a normal  mood and affect. His behavior is normal. Judgment and thought content normal.  Vitals reviewed.    Lab Results  Component Value Date   WBC 7.9 11/13/2017   HGB 12.7 (L) 11/13/2017   HCT 37.7 (L) 11/13/2017   MCV 92.4 11/13/2017   PLT 218 11/13/2017   Lab Results  Component Value Date   FERRITIN 260 10/16/2017   IRON 100 10/16/2017   TIBC 169 (L) 10/16/2017   UIBC 69 10/16/2017   IRONPCTSAT 59 10/16/2017   Lab Results  Component Value Date   RETICCTPCT 1.9 03/24/2013   RBC 4.08 (L) 11/13/2017   No results found for: KPAFRELGTCHN, LAMBDASER, KAPLAMBRATIO No results found for: IGGSERUM, IGA, IGMSERUM No results found for: Odetta Pink, SPEI   Chemistry      Component Value Date/Time   NA 131 10/16/2017 1035   NA 140 04/10/2017 1046   NA 135 (L) 03/06/2017 1104   K 3.7 10/16/2017 1035   K 3.7 04/10/2017 1046   K 4.2 03/06/2017 1104   CL 94 (L) 10/16/2017 1035   CL 98 04/10/2017 1046   CO2 32 10/16/2017 1035   CO2 31 04/10/2017 1046   CO2 30 (H) 03/06/2017 1104   BUN 16 10/16/2017 1035   BUN 21 04/10/2017 1046   BUN 20.0 03/06/2017 1104   CREATININE 1.00 10/16/2017 1035   CREATININE 0.9 04/10/2017 1046   CREATININE 1.0 03/06/2017 1104      Component Value Date/Time   CALCIUM 8.7 10/16/2017 1035   CALCIUM 8.9 04/10/2017 1046   CALCIUM 9.4 03/06/2017 1104   ALKPHOS 62 10/16/2017 1035   ALKPHOS 65 04/10/2017 1046   ALKPHOS 78 03/06/2017 1104   AST 15 10/16/2017 1035   AST 20 03/06/2017 1104   ALT 14 10/16/2017 1035   ALT 21 04/10/2017 1046   ALT 20 03/06/2017 1104   BILITOT 0.7 10/16/2017 1035   BILITOT 0.47 03/06/2017 1104      Impression and Plan: [name] is a very pleasant 72 yo caucasian gentleman with history of hemochromatosis --  homozygous for C282Y mutation.  This, for right now, is a secondary issue.  However, we do had to be cautious with his iron levels.  He now has stage IIIc (T2N2M0)  melanoma of the left thigh. This was excised in November 2018.  Again, the melanoma is not  an issue.  We will continue him on the nivolumab.  He will complete his year of therapy in April 2020.  We will get him over to neurosurgery.  Again, we will make a referral for him I will call Dr. Earle Gell.  We will plan to see him back in 1 month.   [name], MD 8/8/201911:48 AM

## 2017-11-13 NOTE — Patient Instructions (Signed)
Nivolumab injection What is this medicine? NIVOLUMAB (nye VOL ue mab) is a monoclonal antibody. It is used to treat melanoma, lung cancer, kidney cancer, head and neck cancer, Hodgkin lymphoma, urothelial cancer, colon cancer, and liver cancer. This medicine may be used for other purposes; ask your health care provider or pharmacist if you have questions. COMMON BRAND NAME(S): Opdivo What should I tell my health care provider before I take this medicine? They need to know if you have any of these conditions: -diabetes -immune system problems -kidney disease -liver disease -lung disease -organ transplant -stomach or intestine problems -thyroid disease -an unusual or allergic reaction to nivolumab, other medicines, foods, dyes, or preservatives -pregnant or trying to get pregnant -breast-feeding How should I use this medicine? This medicine is for infusion into a vein. It is given by a health care professional in a hospital or clinic setting. A special MedGuide will be given to you before each treatment. Be sure to read this information carefully each time. Talk to your pediatrician regarding the use of this medicine in children. While this drug may be prescribed for children as young as 12 years for selected conditions, precautions do apply. Overdosage: If you think you have taken too much of this medicine contact a poison control center or emergency room at once. NOTE: This medicine is only for you. Do not share this medicine with others. What if I miss a dose? It is important not to miss your dose. Call your doctor or health care professional if you are unable to keep an appointment. What may interact with this medicine? Interactions have not been studied. Give your health care provider a list of all the medicines, herbs, non-prescription drugs, or dietary supplements you use. Also tell them if you smoke, drink alcohol, or use illegal drugs. Some items may interact with your  medicine. This list may not describe all possible interactions. Give your health care provider a list of all the medicines, herbs, non-prescription drugs, or dietary supplements you use. Also tell them if you smoke, drink alcohol, or use illegal drugs. Some items may interact with your medicine. What should I watch for while using this medicine? This drug may make you feel generally unwell. Continue your course of treatment even though you feel ill unless your doctor tells you to stop. You may need blood work done while you are taking this medicine. Do not become pregnant while taking this medicine or for 5 months after stopping it. Women should inform their doctor if they wish to become pregnant or think they might be pregnant. There is a potential for serious side effects to an unborn child. Talk to your health care professional or pharmacist for more information. Do not breast-feed an infant while taking this medicine. What side effects may I notice from receiving this medicine? Side effects that you should report to your doctor or health care professional as soon as possible: -allergic reactions like skin rash, itching or hives, swelling of the face, lips, or tongue -black, tarry stools -blood in the urine -bloody or watery diarrhea -changes in vision -change in sex drive -changes in emotions or moods -chest pain -confusion -cough -decreased appetite -diarrhea -facial flushing -feeling faint or lightheaded -fever, chills -hair loss -hallucination, loss of contact with reality -headache -irritable -joint pain -loss of memory -muscle pain -muscle weakness -seizures -shortness of breath -signs and symptoms of high blood sugar such as dizziness; dry mouth; dry skin; fruity breath; nausea; stomach pain; increased hunger or thirst; increased  urination -signs and symptoms of kidney injury like trouble passing urine or change in the amount of urine -signs and symptoms of liver injury  like dark yellow or brown urine; general ill feeling or flu-like symptoms; light-colored stools; loss of appetite; nausea; right upper belly pain; unusually weak or tired; yellowing of the eyes or skin -stiff neck -swelling of the ankles, feet, hands -weight gain Side effects that usually do not require medical attention (report to your doctor or health care professional if they continue or are bothersome): -bone pain -constipation -tiredness -vomiting This list may not describe all possible side effects. Call your doctor for medical advice about side effects. You may report side effects to FDA at 1-800-FDA-1088. Where should I keep my medicine? This drug is given in a hospital or clinic and will not be stored at home. NOTE: This sheet is a summary. It may not cover all possible information. If you have questions about this medicine, talk to your doctor, pharmacist, or health care provider.  2018 Elsevier/Gold Standard (2016-01-01 17:49:34)

## 2017-11-14 DIAGNOSIS — S32009K Unspecified fracture of unspecified lumbar vertebra, subsequent encounter for fracture with nonunion: Secondary | ICD-10-CM | POA: Diagnosis not present

## 2017-11-14 LAB — IRON AND TIBC
Iron: 112 ug/dL (ref 42–163)
Saturation Ratios: 63 % (ref 42–163)
TIBC: 176 ug/dL — ABNORMAL LOW (ref 202–409)
UIBC: 65 ug/dL

## 2017-11-14 LAB — FERRITIN: Ferritin: 106 ng/mL (ref 24–336)

## 2017-11-19 ENCOUNTER — Other Ambulatory Visit: Payer: Self-pay | Admitting: *Deleted

## 2017-11-19 MED ORDER — FLUTICASONE PROPIONATE 50 MCG/ACT NA SUSP: NASAL | 11 refills | Status: AC

## 2017-11-20 ENCOUNTER — Telehealth: Payer: Self-pay | Admitting: *Deleted

## 2017-11-20 ENCOUNTER — Other Ambulatory Visit: Payer: Self-pay | Admitting: *Deleted

## 2017-11-20 DIAGNOSIS — C4372 Malignant melanoma of left lower limb, including hip: Secondary | ICD-10-CM

## 2017-11-20 MED ORDER — ONDANSETRON HCL 8 MG PO TABS: 8.0000 mg | ORAL_TABLET | Freq: Three times a day (TID) | ORAL | 3 refills | Status: DC

## 2017-11-20 NOTE — Telephone Encounter (Signed)
Patient c/o significant nausea and vomiting along with diarrhea. These are not new symptoms, but they aren't getting any better.  For the n/v he is using mostly compazine, but states it doesn't work real well. He states Zofran works well, but he has a limited supply. Instructed patient to change to a schedule Zofran admin and a new prescription would be sent. He is to take ATC to prevent the development of nausea. He can use compazine for breakthrough. He understands.  For the diarrhea he is reluctant to use anything strong as he usually has issues with constipation and he is very fearful of becoming constipated. Since Zofran has a high incidence of constipation he doesn't want to try anything new. He will continue with imodium after each new stool and see how his bowels change with the increased zofran dosing.   Patient will call back if he has any trouble or symptoms don't improve.

## 2017-11-26 ENCOUNTER — Other Ambulatory Visit: Payer: Self-pay

## 2017-11-26 MED ORDER — PANTOPRAZOLE SODIUM 40 MG PO TBEC: 40.0000 mg | DELAYED_RELEASE_TABLET | Freq: Every evening | ORAL | 1 refills | Status: DC

## 2017-12-01 ENCOUNTER — Telehealth: Payer: Self-pay | Admitting: *Deleted

## 2017-12-01 ENCOUNTER — Other Ambulatory Visit: Payer: Self-pay | Admitting: *Deleted

## 2017-12-01 DIAGNOSIS — M47812 Spondylosis without myelopathy or radiculopathy, cervical region: Secondary | ICD-10-CM | POA: Diagnosis not present

## 2017-12-01 DIAGNOSIS — C4372 Malignant melanoma of left lower limb, including hip: Secondary | ICD-10-CM

## 2017-12-01 MED ORDER — PREDNISONE 20 MG PO TABS: 80.0000 mg | ORAL_TABLET | Freq: Every day | ORAL | 1 refills | Status: DC

## 2017-12-01 NOTE — Telephone Encounter (Signed)
Patient is reporting that symptoms reported on 11/20/17 have not improved. He continues to have frequent nausea with occasional vomiting. He is not eating or drinking well due to the nausea, but also because he has no interest, appetite. He is taking his antiemetics.  He also has sudden onset diarrhea. He has 0-1 bowel movements per day. His need to have a BM comes on very suddenly and is extremely loose. He has a bowel movement every 1-2 days, but they are all in this fashion. He takes one imodium after each stool for approximately one imodium per day.   Spoke with Dr Marin Olp. He wants patient to start prednisone 42m daily and come in to be seen.

## 2017-12-02 ENCOUNTER — Other Ambulatory Visit: Payer: Self-pay | Admitting: Family

## 2017-12-02 ENCOUNTER — Other Ambulatory Visit: Payer: Self-pay

## 2017-12-02 ENCOUNTER — Inpatient Hospital Stay: Payer: Medicare Other

## 2017-12-02 ENCOUNTER — Inpatient Hospital Stay (HOSPITAL_BASED_OUTPATIENT_CLINIC_OR_DEPARTMENT_OTHER): Payer: Medicare Other | Admitting: Family

## 2017-12-02 ENCOUNTER — Telehealth: Payer: Self-pay | Admitting: *Deleted

## 2017-12-02 VITALS — BP 104/55 | HR 61 | Temp 97.5°F | Resp 20 | Wt 134.1 lb

## 2017-12-02 DIAGNOSIS — J449 Chronic obstructive pulmonary disease, unspecified: Secondary | ICD-10-CM

## 2017-12-02 DIAGNOSIS — R112 Nausea with vomiting, unspecified: Secondary | ICD-10-CM

## 2017-12-02 DIAGNOSIS — C4372 Malignant melanoma of left lower limb, including hip: Secondary | ICD-10-CM

## 2017-12-02 DIAGNOSIS — E876 Hypokalemia: Secondary | ICD-10-CM

## 2017-12-02 DIAGNOSIS — R197 Diarrhea, unspecified: Secondary | ICD-10-CM

## 2017-12-02 DIAGNOSIS — Z5112 Encounter for antineoplastic immunotherapy: Secondary | ICD-10-CM | POA: Diagnosis not present

## 2017-12-02 DIAGNOSIS — M7989 Other specified soft tissue disorders: Secondary | ICD-10-CM

## 2017-12-02 DIAGNOSIS — D500 Iron deficiency anemia secondary to blood loss (chronic): Secondary | ICD-10-CM

## 2017-12-02 LAB — CBC WITH DIFFERENTIAL (CANCER CENTER ONLY)
Basophils Absolute: 0 10*3/uL (ref 0.0–0.1)
Basophils Relative: 0 %
Eosinophils Absolute: 0 10*3/uL (ref 0.0–0.5)
Eosinophils Relative: 0 %
HCT: 37.9 % — ABNORMAL LOW (ref 38.7–49.9)
Hemoglobin: 13.2 g/dL (ref 13.0–17.1)
Lymphocytes Relative: 7 %
Lymphs Abs: 0.5 10*3/uL — ABNORMAL LOW (ref 0.9–3.3)
MCH: 31.4 pg (ref 28.0–33.4)
MCHC: 34.8 g/dL (ref 32.0–35.9)
MCV: 90 fL (ref 82.0–98.0)
Monocytes Absolute: 0.4 10*3/uL (ref 0.1–0.9)
Monocytes Relative: 6 %
Neutro Abs: 5.8 10*3/uL (ref 1.5–6.5)
Neutrophils Relative %: 87 %
Platelet Count: 209 10*3/uL (ref 145–400)
RBC: 4.21 MIL/uL (ref 4.20–5.70)
RDW: 13.2 % (ref 11.1–15.7)
WBC Count: 6.7 10*3/uL (ref 4.0–10.0)

## 2017-12-02 LAB — CMP (CANCER CENTER ONLY)
ALT: 12 U/L (ref 10–47)
AST: 18 U/L (ref 11–38)
Albumin: 3.3 g/dL — ABNORMAL LOW (ref 3.5–5.0)
Alkaline Phosphatase: 61 U/L (ref 26–84)
Anion gap: 7 (ref 5–15)
BUN: 18 mg/dL (ref 7–22)
CO2: 28 mmol/L (ref 18–33)
Calcium: 8.7 mg/dL (ref 8.0–10.3)
Chloride: 94 mmol/L — ABNORMAL LOW (ref 98–108)
Creatinine: 0.8 mg/dL (ref 0.60–1.20)
Glucose, Bld: 153 mg/dL — ABNORMAL HIGH (ref 73–118)
Potassium: 2.8 mmol/L — CL (ref 3.3–4.7)
Sodium: 129 mmol/L (ref 128–145)
Total Bilirubin: 0.6 mg/dL (ref 0.2–1.6)
Total Protein: 6.1 g/dL — ABNORMAL LOW (ref 6.4–8.1)

## 2017-12-02 MED ORDER — POTASSIUM CHLORIDE CRYS ER 20 MEQ PO TBCR: EXTENDED_RELEASE_TABLET | ORAL | 0 refills | Status: DC

## 2017-12-02 MED ORDER — SODIUM CHLORIDE 0.9 % IV SOLN
Freq: Once | INTRAVENOUS | Status: AC
  Administered 2017-12-02: 12:00:00 via INTRAVENOUS
  Filled 2017-12-02: qty 250

## 2017-12-02 MED ORDER — LORAZEPAM 0.5 MG PO TABS: 0.5000 mg | ORAL_TABLET | Freq: Three times a day (TID) | ORAL | 0 refills | Status: DC

## 2017-12-02 NOTE — Telephone Encounter (Signed)
Critical Value Potassium 2.8 Laverna Peace NP notified. Orders will be placed.

## 2017-12-02 NOTE — Patient Instructions (Signed)
Dehydration, Adult Dehydration is when there is not enough fluid or water in your body. This happens when you lose more fluids than you take in. Dehydration can range from mild to very bad. It should be treated right away to keep it from getting very bad. Symptoms of mild dehydration may include:  Thirst.  Dry lips.  Slightly dry mouth.  Dry, warm skin.  Dizziness. Symptoms of moderate dehydration may include:  Very dry mouth.  Muscle cramps.  Dark pee (urine). Pee may be the color of tea.  Your body making less pee.  Your eyes making fewer tears.  Heartbeat that is uneven or faster than normal (palpitations).  Headache.  Light-headedness, especially when you stand up from sitting.  Fainting (syncope). Symptoms of very bad dehydration may include:  Changes in skin, such as: ? Cold and clammy skin. ? Blotchy (mottled) or pale skin. ? Skin that does not quickly return to normal after being lightly pinched and let go (poor skin turgor).  Changes in body fluids, such as: ? Feeling very thirsty. ? Your eyes making fewer tears. ? Not sweating when body temperature is high, such as in hot weather. ? Your body making very little pee.  Changes in vital signs, such as: ? Weak pulse. ? Pulse that is more than 100 beats a minute when you are sitting still. ? Fast breathing. ? Low blood pressure.  Other changes, such as: ? Sunken eyes. ? Cold hands and feet. ? Confusion. ? Lack of energy (lethargy). ? Trouble waking up from sleep. ? Short-term weight loss. ? Unconsciousness. Follow these instructions at home:  If told by your doctor, drink an ORS: ? Make an ORS by using instructions on the package. ? Start by drinking small amounts, about  cup (120 mL) every 5-10 minutes. ? Slowly drink more until you have had the amount that your doctor said to have.  Drink enough clear fluid to keep your pee clear or pale yellow. If you were told to drink an ORS, finish the ORS  first, then start slowly drinking clear fluids. Drink fluids such as: ? Water. Do not drink only water by itself. Doing that can make the salt (sodium) level in your body get too low (hyponatremia). ? Ice chips. ? Fruit juice that you have added water to (diluted). ? Low-calorie sports drinks.  Avoid: ? Alcohol. ? Drinks that have a lot of sugar. These include high-calorie sports drinks, fruit juice that does not have water added, and soda. ? Caffeine. ? Foods that are greasy or have a lot of fat or sugar.  Take over-the-counter and prescription medicines only as told by your doctor.  Do not take salt tablets. Doing that can make the salt level in your body get too high (hypernatremia).  Eat foods that have minerals (electrolytes). Examples include bananas, oranges, potatoes, tomatoes, and spinach.  Keep all follow-up visits as told by your doctor. This is important. Contact a doctor if:  You have belly (abdominal) pain that: ? Gets worse. ? Stays in one area (localizes).  You have a rash.  You have a stiff neck.  You get angry or annoyed more easily than normal (irritability).  You are more sleepy than normal.  You have a harder time waking up than normal.  You feel: ? Weak. ? Dizzy. ? Very thirsty.  You have peed (urinated) only a small amount of very dark pee during 6-8 hours. Get help right away if:  You have symptoms of   very bad dehydration.  You cannot drink fluids without throwing up (vomiting).  Your symptoms get worse with treatment.  You have a fever.  You have a very bad headache.  You are throwing up or having watery poop (diarrhea) and it: ? Gets worse. ? Does not go away.  You have blood or something green (bile) in your throw-up.  You have blood in your poop (stool). This may cause poop to look black and tarry.  You have not peed in 6-8 hours.  You pass out (faint).  Your heart rate when you are sitting still is more than 100 beats a  minute.  You have trouble breathing. This information is not intended to replace advice given to you by your health care provider. Make sure you discuss any questions you have with your health care provider. Document Released: 01/19/2009 Document Revised: 10/13/2015 Document Reviewed: 05/19/2015 Elsevier Interactive Patient Education  2018 [employer] Inc[employer]  

## 2017-12-02 NOTE — Progress Notes (Signed)
Hematology and Oncology Follow Up Visit  [name] [id number] 06-19-1945 72 y.o. 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 Prednisone was ordered yesterday and he plans to start today after his wife picks up. I have written down his instructions for taper to take home and he verbalized understanding.  He has tried imodium (only 2 pills) with no improvement. He does not feel that compazine and zofran have helped.  His SOB due to COPD is stable.  No fever, chills, cough, rash, dizziness, chest pain, palpitations, abdominal pain or changes in bowel or bladder habits.  He still has some swelling in his feet and ankles. Pedal pulses are +2. No tenderness, numbness or tingling in his extremities.  No lymphadenopathy noted on his exam.  His appetite is down due to the n/v/d. He is trying his best to stay hydrated and is drinking Boost. His weight is down 3 lbs.   ECOG Performance Status: 2 - Symptomatic, <50% confined to bed  Medications:  Allergies as of 12/02/2017      Reactions   Cefepime Hives, Shortness Of Breath   Cephaeline Hives   Morphine And Related Shortness Of Breath, Nausea And Vomiting, Swelling, Other (See Comments)   Agitation, tolerates dilaudid Other reaction(s): Other (See Comments) Agitation, tolerates dilaudid   Penicillins Hives, Shortness Of Breath, Rash   Has patient had a PCN reaction causing immediate rash, facial/tongue/throat swelling, SOB or lightheadedness with hypotension: Yes Has patient had a PCN reaction causing severe rash  involving mucus membranes or skin necrosis: Yes Has patient had a PCN reaction that required hospitalization No Has patient had a PCN reaction occurring within the last 10 years: No If all of the above answers are "NO", then may proceed with Cephalosporin use.   Morphine    Oxycodone-acetaminophen    Doxycycline Rash      Medication List        Accurate as of 12/02/17 12:54 PM. Always use your most recent med list.          albuterol 108 (90 Base) MCG/ACT inhaler Commonly known as:  PROVENTIL HFA;VENTOLIN HFA Inhale 1-2 puffs into the lungs every 4 (four) hours as needed for wheezing or shortness of breath.   alfuzosin 10 MG 24 hr tablet Commonly known as:  UROXATRAL Take 10 mg by mouth at bedtime.   ALPRAZolam 0.5 MG tablet Commonly known as:  XANAX Take 1 tablet (0.5 mg total) by mouth 2 (two) times daily as needed. for anxiety   CIALIS 5 MG tablet Generic drug:  tadalafil Take 5 mg by mouth as needed.   clopidogrel 75 MG tablet Commonly known as:  PLAVIX TAKE 1 TABLET BY MOUTH EVERY DAY WITH BREAKFAST   COLCRYS 0.6 MG tablet Generic drug:  colchicine Take 1 tablet by mouth every evening.   dronabinol 5 MG capsule Commonly known as:  MARINOL Take 1 capsule (5 mg total) by mouth 2 (two) times daily before a meal.   FISH OIL OMEGA-3 1000 MG Caps Take 1,000 mg by mouth daily.   FLUoxetine 40 MG capsule Commonly known as:  PROZAC  TAKE 1 CAPSULE BY MOUTH TWICE DAILY   fluticasone 50 MCG/ACT nasal spray Commonly known as:  FLONASE INSTILL 2 SPRAYS IN EACH NOSTRIL EVERY DAY   gabapentin 300 MG capsule Commonly known as:  NEURONTIN Take 1 capsule (300 mg total) by mouth 2 (two) times daily.   HYDROmorphone 2 MG tablet Commonly known as:  DILAUDID Take 1 tablet (2 mg total) by mouth every 8 (eight) hours as needed for severe pain. Please fill on or after 12/07/17   lidocaine-prilocaine cream Commonly known as:  EMLA Apply 1 application topically as needed.     linaclotide 145 MCG Caps capsule Commonly known as:  LINZESS Take 1 capsule (145 mcg total) by mouth daily before breakfast.   losartan-hydrochlorothiazide 50-12.5 MG tablet Commonly known as:  HYZAAR TAKE 1 TABLET BY MOUTH DAILY   Melatonin 5 MG Tabs Take 5 mg by mouth at bedtime. Takes 10 mg at HS   metoprolol tartrate 25 MG tablet Commonly known as:  LOPRESSOR Take 1 tablet (25 mg total) by mouth 2 (two) times daily.   ondansetron 8 MG tablet Commonly known as:  ZOFRAN Take 1 tablet (8 mg total) by mouth 3 (three) times daily.   pantoprazole 40 MG tablet Commonly known as:  PROTONIX Take 1 tablet (40 mg total) by mouth every evening.   pravastatin 20 MG tablet Commonly known as:  PRAVACHOL Take 1 tablet (20 mg total) by mouth daily.   predniSONE 5 MG tablet Commonly known as:  DELTASONE Take 5 mg by mouth daily with breakfast.   predniSONE 20 MG tablet Commonly known as:  DELTASONE Take 4 tablets (80 mg total) by mouth daily with breakfast.   prochlorperazine 10 MG tablet Commonly known as:  COMPAZINE TAKE 1 TABLET(10 MG) BY MOUTH EVERY 6 HOURS AS NEEDED FOR NAUSEA OR VOMITING   traZODone 100 MG tablet Commonly known as:  DESYREL TAKE 1 TABLET(100 MG) BY MOUTH AT BEDTIME AS NEEDED FOR SLEEP   vitamin B-12 1000 MCG tablet Commonly known as:  CYANOCOBALAMIN Take 1,000 mcg by mouth daily.   Vitamin D3 2000 units capsule Take 1 capsule (2,000 Units total) by mouth daily.       Allergies:  Allergies  Allergen Reactions  . Cefepime Hives and Shortness Of Breath  . Cephaeline Hives  . Morphine And Related Shortness Of Breath, Nausea And Vomiting, Swelling and Other (See Comments)    Agitation, tolerates dilaudid Other reaction(s): Other (See Comments) Agitation, tolerates dilaudid  . Penicillins Hives, Shortness Of Breath and Rash    Has patient had a PCN reaction causing immediate rash, facial/tongue/throat swelling, SOB or lightheadedness with  hypotension: Yes Has patient had a PCN reaction causing severe rash involving mucus membranes or skin necrosis: Yes Has patient had a PCN reaction that required hospitalization No Has patient had a PCN reaction occurring within the last 10 years: No If all of the above answers are "NO", then may proceed with Cephalosporin use.   [name] Morphine   . Oxycodone-Acetaminophen   . Doxycycline Rash    Past Medical History, Surgical history, Social history, and Family History were reviewed and updated.  Review of Systems: All other 10 point review of systems is negative.   Physical Exam:  vitals were not taken for this visit.   Wt Readings from Last 3 Encounters:  11/13/17 137 lb (62.1 kg)  10/15/17 145 lb (65.8 kg)  09/30/17 137 lb (62.1 kg)    Ocular: Sclerae unicteric, pupils equal, round and reactive  to light Ear-nose-throat: Oropharynx clear, dentition fair Lymphatic: No cervical, supraclavicular or axillary adenopathy Lungs no rales or rhonchi, good excursion bilaterally Heart regular rate and rhythm, no murmur appreciated Abd soft, nontender, positive bowel sounds, no liver or spleen tip palpated on exam, no fluid wave  MSK no focal spinal tenderness, no joint edema Neuro: non-focal, well-oriented, appropriate affect Breasts: Deferred   Lab Results  Component Value Date   WBC 6.7 12/02/2017   HGB 13.2 12/02/2017   HCT 37.9 (L) 12/02/2017   MCV 90.0 12/02/2017   PLT 209 12/02/2017   Lab Results  Component Value Date   FERRITIN 106 11/13/2017   IRON 112 11/13/2017   TIBC 176 (L) 11/13/2017   UIBC 65 11/13/2017   IRONPCTSAT 63 11/13/2017   Lab Results  Component Value Date   RETICCTPCT 1.9 03/24/2013   RBC 4.21 12/02/2017   No results found for: KPAFRELGTCHN, LAMBDASER, KAPLAMBRATIO No results found for: Kandis Cocking, IGMSERUM No results found for: Odetta Pink, SPEI   Chemistry      Component Value Date/Time     NA 129 11/13/2017 1024   NA 140 04/10/2017 1046   NA 135 (L) 03/06/2017 1104   K 3.8 11/13/2017 1024   K 3.7 04/10/2017 1046   K 4.2 03/06/2017 1104   CL 96 (L) 11/13/2017 1024   CL 98 04/10/2017 1046   CO2 32 11/13/2017 1024   CO2 31 04/10/2017 1046   CO2 30 (H) 03/06/2017 1104   BUN 17 11/13/2017 1024   BUN 21 04/10/2017 1046   BUN 20.0 03/06/2017 1104   CREATININE 1.00 11/13/2017 1024   CREATININE 0.9 04/10/2017 1046   CREATININE 1.0 03/06/2017 1104      Component Value Date/Time   CALCIUM 9.1 11/13/2017 1024   CALCIUM 8.9 04/10/2017 1046   CALCIUM 9.4 03/06/2017 1104   ALKPHOS 53 11/13/2017 1024   ALKPHOS 65 04/10/2017 1046   ALKPHOS 78 03/06/2017 1104   AST 18 11/13/2017 1024   AST 20 03/06/2017 1104   ALT 17 11/13/2017 1024   ALT 21 04/10/2017 1046   ALT 20 03/06/2017 1104   BILITOT 0.6 11/13/2017 1024   BILITOT 0.47 03/06/2017 1104      Impression and Plan: [name] is a very pleasant 72 yo caucasian gentleman with hemochromatosis, homozygous for C282Y mutation.  He also has stage IIIc (T2N2M0) melanoma of the left thigh. This was excised in November 2018 and he has tolerated Opdivo well until his last cycle. He has since developed n/v/d.  He will start the following Prednisone taper today: 80 mg PO daily for 3 days, 60 mg PO daily for 3 days, 40 mg PO daily for 5 days, 20 mg PO daily for 5 days and 10 mg PO daily for 7 days.  We will also have him try taking Ativan 0.5 mg PO for nausea.  He was given IV fluids today.  His potassium was low so we will start him on KDUR 80 meq today and then 40 meq daily for 10 days.  We will plan to see him back at his follow-up next week on 9/5.  He will contact our office with any questions or concerns. We can certainly see him sooner if need be.   Laverna Peace, NP 8/27/201912:54 PM

## 2017-12-02 NOTE — Patient Instructions (Signed)
Implanted Port Insertion, Care After °This sheet gives you information about how to care for yourself after your procedure. Your health care provider may also give you more specific instructions. If you have problems or questions, contact your health care provider. °What can I expect after the procedure? °After your procedure, it is common to have: °· Discomfort at the port insertion site. °· Bruising on the skin over the port. This should improve over 3-4 days. ° °Follow these instructions at home: °Port care °· After your port is placed, you will get a manufacturer's information card. The card has information about your port. Keep this card with you at all times. °· Take care of the port as told by your health care provider. Ask your health care provider if you or a family member can get training for taking care of the port at home. A home health care nurse may also take care of the port. °· Make sure to remember what type of port you have. °Incision care °· Follow instructions from your health care provider about how to take care of your port insertion site. Make sure you: °? Wash your hands with soap and water before you change your bandage (dressing). If soap and water are not available, use hand sanitizer. °? Change your dressing as told by your health care provider. °? Leave stitches (sutures), skin glue, or adhesive strips in place. These skin closures may need to stay in place for 2 weeks or longer. If adhesive strip edges start to loosen and curl up, you may trim the loose edges. Do not remove adhesive strips completely unless your health care provider tells you to do that. °· Check your port insertion site every day for signs of infection. Check for: °? More redness, swelling, or pain. °? More fluid or blood. °? Warmth. °? Pus or a bad smell. °General instructions °· Do not take baths, swim, or use a hot tub until your health care provider approves. °· Do not lift anything that is heavier than 10 lb (4.5  kg) for a week, or as told by your health care provider. °· Ask your health care provider when it is okay to: °? Return to work or school. °? Resume usual physical activities or sports. °· Do not drive for 24 hours if you were given a medicine to help you relax (sedative). °· Take over-the-counter and prescription medicines only as told by your health care provider. °· Wear a medical alert bracelet in case of an emergency. This will tell any health care providers that you have a port. °· Keep all follow-up visits as told by your health care provider. This is important. °Contact a health care provider if: °· You cannot flush your port with saline as directed, or you cannot draw blood from the port. °· You have a fever or chills. °· You have more redness, swelling, or pain around your port insertion site. °· You have more fluid or blood coming from your port insertion site. °· Your port insertion site feels warm to the touch. °· You have pus or a bad smell coming from the port insertion site. °Get help right away if: °· You have chest pain or shortness of breath. °· You have bleeding from your port that you cannot control. °Summary °· Take care of the port as told by your health care provider. °· Change your dressing as told by your health care provider. °· Keep all follow-up visits as told by your health care provider. °  This information is not intended to replace advice given to you by your health care provider. Make sure you discuss any questions you have with your health care provider. °Document Released: 01/13/2013 Document Revised: 02/14/2016 Document Reviewed: 02/14/2016 °Elsevier Interactive Patient Education © 2017 [employer] ° °

## 2017-12-03 LAB — IRON AND TIBC
Iron: 67 ug/dL (ref 42–163)
Saturation Ratios: 40 % — ABNORMAL LOW (ref 42–163)
TIBC: 166 ug/dL — ABNORMAL LOW (ref 202–409)
UIBC: 99 ug/dL

## 2017-12-03 LAB — FERRITIN: Ferritin: 112 ng/mL (ref 24–336)

## 2017-12-09 ENCOUNTER — Other Ambulatory Visit: Payer: Self-pay | Admitting: Internal Medicine

## 2017-12-11 ENCOUNTER — Inpatient Hospital Stay: Payer: Medicare Other | Attending: Hematology & Oncology | Admitting: Hematology & Oncology

## 2017-12-11 ENCOUNTER — Inpatient Hospital Stay: Payer: Medicare Other

## 2017-12-11 VITALS — BP 110/57 | HR 65 | Temp 98.1°F | Resp 17 | Wt 140.0 lb

## 2017-12-11 DIAGNOSIS — Z5112 Encounter for antineoplastic immunotherapy: Secondary | ICD-10-CM | POA: Diagnosis not present

## 2017-12-11 DIAGNOSIS — Z79899 Other long term (current) drug therapy: Secondary | ICD-10-CM | POA: Insufficient documentation

## 2017-12-11 DIAGNOSIS — C4372 Malignant melanoma of left lower limb, including hip: Secondary | ICD-10-CM | POA: Insufficient documentation

## 2017-12-11 DIAGNOSIS — D500 Iron deficiency anemia secondary to blood loss (chronic): Secondary | ICD-10-CM

## 2017-12-11 LAB — CBC WITH DIFFERENTIAL/PLATELET
Basophils Absolute: 0 10*3/uL (ref 0.0–0.1)
Basophils Relative: 0 %
Eosinophils Absolute: 0 10*3/uL (ref 0.0–0.7)
Eosinophils Relative: 0 %
HCT: 39.6 % (ref 39.0–52.0)
Hemoglobin: 13.3 g/dL (ref 13.0–17.0)
Lymphocytes Relative: 11 %
Lymphs Abs: 1 10*3/uL (ref 0.7–4.0)
MCH: 31.1 pg (ref 26.0–34.0)
MCHC: 33.6 g/dL (ref 30.0–36.0)
MCV: 92.5 fL (ref 78.0–100.0)
Monocytes Absolute: 0.7 10*3/uL (ref 0.1–1.0)
Monocytes Relative: 8 %
Neutro Abs: 6.9 10*3/uL (ref 1.7–7.7)
Neutrophils Relative %: 81 %
Platelets: 233 10*3/uL (ref 150–400)
RBC: 4.28 MIL/uL (ref 4.22–5.81)
RDW: 14.2 % (ref 11.5–15.5)
WBC: 8.6 10*3/uL (ref 4.0–10.5)

## 2017-12-11 LAB — IRON AND TIBC
Iron: 166 ug/dL — ABNORMAL HIGH (ref 42–163)
Saturation Ratios: 96 % (ref 42–163)
TIBC: 174 ug/dL — ABNORMAL LOW (ref 202–409)
UIBC: 8 ug/dL

## 2017-12-11 LAB — CMP (CANCER CENTER ONLY)
ALT: 21 U/L (ref 10–47)
AST: 19 U/L (ref 11–38)
Albumin: 3.2 g/dL — ABNORMAL LOW (ref 3.5–5.0)
Alkaline Phosphatase: 60 U/L (ref 26–84)
Anion gap: 0 — ABNORMAL LOW (ref 5–15)
BUN: 17 mg/dL (ref 7–22)
CO2: 30 mmol/L (ref 18–33)
Calcium: 8.5 mg/dL (ref 8.0–10.3)
Chloride: 100 mmol/L (ref 98–108)
Creatinine: 1 mg/dL (ref 0.60–1.20)
Glucose, Bld: 112 mg/dL (ref 73–118)
Potassium: 4.2 mmol/L (ref 3.3–4.7)
Sodium: 130 mmol/L (ref 128–145)
Total Bilirubin: 0.6 mg/dL (ref 0.2–1.6)
Total Protein: 5.6 g/dL — ABNORMAL LOW (ref 6.4–8.1)

## 2017-12-11 LAB — FERRITIN: Ferritin: 84 ng/mL (ref 24–336)

## 2017-12-11 LAB — LACTATE DEHYDROGENASE: LDH: 151 U/L (ref 98–192)

## 2017-12-11 MED ORDER — SODIUM CHLORIDE 0.9% FLUSH
10.0000 mL | INTRAVENOUS | Status: DC | PRN
  Administered 2017-12-11: 10 mL
  Filled 2017-12-11: qty 10

## 2017-12-11 MED ORDER — HEPARIN SOD (PORK) LOCK FLUSH 100 UNIT/ML IV SOLN
500.0000 [IU] | Freq: Once | INTRAVENOUS | Status: AC | PRN
  Administered 2017-12-11: 500 [IU]
  Filled 2017-12-11: qty 5

## 2017-12-11 MED ORDER — SODIUM CHLORIDE 0.9 % IV SOLN
Freq: Once | INTRAVENOUS | Status: AC
  Administered 2017-12-11: 11:00:00 via INTRAVENOUS
  Filled 2017-12-11: qty 250

## 2017-12-11 MED ORDER — SODIUM CHLORIDE 0.9 % IV SOLN
480.0000 mg | Freq: Once | INTRAVENOUS | Status: AC
  Administered 2017-12-11: 480 mg via INTRAVENOUS
  Filled 2017-12-11: qty 48

## 2017-12-11 NOTE — Patient Instructions (Signed)
Implanted Port Home Guide An implanted port is a type of central line that is placed under the skin. Central lines are used to provide IV access when treatment or nutrition needs to be given through a person's veins. Implanted ports are used for long-term IV access. An implanted port may be placed because:  You need IV medicine that would be irritating to the small veins in your hands or arms.  You need long-term IV medicines, such as antibiotics.  You need IV nutrition for a long period.  You need frequent blood draws for lab tests.  You need dialysis.  Implanted ports are usually placed in the chest area, but they can also be placed in the upper arm, the abdomen, or the leg. An implanted port has two main parts:  Reservoir. The reservoir is round and will appear as a small, raised area under your skin. The reservoir is the part where a needle is inserted to give medicines or draw blood.  Catheter. The catheter is a thin, flexible tube that extends from the reservoir. The catheter is placed into a large vein. Medicine that is inserted into the reservoir goes into the catheter and then into the vein.  How will I care for my incision site? Do not get the incision site wet. Bathe or shower as directed by your health care provider. How is my port accessed? Special steps must be taken to access the port:  Before the port is accessed, a numbing cream can be placed on the skin. This helps numb the skin over the port site.  Your health care provider uses a sterile technique to access the port. ? Your health care provider must put on a mask and sterile gloves. ? The skin over your port is cleaned carefully with an antiseptic and allowed to dry. ? The port is gently pinched between sterile gloves, and a needle is inserted into the port.  Only "non-coring" port needles should be used to access the port. Once the port is accessed, a blood return should be checked. This helps ensure that the port  is in the vein and is not clogged.  If your port needs to remain accessed for a constant infusion, a clear (transparent) bandage will be placed over the needle site. The bandage and needle will need to be changed every week, or as directed by your health care provider.  Keep the bandage covering the needle clean and dry. Do not get it wet. Follow your health care provider's instructions on how to take a shower or bath while the port is accessed.  If your port does not need to stay accessed, no bandage is needed over the port.  What is flushing? Flushing helps keep the port from getting clogged. Follow your health care provider's instructions on how and when to flush the port. Ports are usually flushed with saline solution or a medicine called heparin. The need for flushing will depend on how the port is used.  If the port is used for intermittent medicines or blood draws, the port will need to be flushed: ? After medicines have been given. ? After blood has been drawn. ? As part of routine maintenance.  If a constant infusion is running, the port may not need to be flushed.  How long will my port stay implanted? The port can stay in for as long as your health care provider thinks it is needed. When it is time for the port to come out, surgery will be   done to remove it. The procedure is similar to the one performed when the port was put in. When should I seek immediate medical care? When you have an implanted port, you should seek immediate medical care if:  You notice a bad smell coming from the incision site.  You have swelling, redness, or drainage at the incision site.  You have more swelling or pain at the port site or the surrounding area.  You have a fever that is not controlled with medicine.  This information is not intended to replace advice given to you by your health care provider. Make sure you discuss any questions you have with your health care provider. Document  Released: 03/25/2005 Document Revised: 08/31/2015 Document Reviewed: 11/30/2012 Elsevier Interactive Patient Education  2017 [employer]  

## 2017-12-11 NOTE — Patient Instructions (Signed)
[facility] Discharge Instructions for Patients Receiving Chemotherapy  Today you received the following chemotherapy agents Nivolumab To help prevent nausea and vomiting after your treatment, we encourage you to take your nausea medication as prescribed.   If you develop nausea and vomiting that is not controlled by your nausea medication, call the clinic.   BELOW ARE SYMPTOMS THAT SHOULD BE REPORTED IMMEDIATELY:  *FEVER GREATER THAN 100.5 F  *CHILLS WITH OR WITHOUT FEVER  NAUSEA AND VOMITING THAT IS NOT CONTROLLED WITH YOUR NAUSEA MEDICATION  *UNUSUAL SHORTNESS OF BREATH  *UNUSUAL BRUISING OR BLEEDING  TENDERNESS IN MOUTH AND THROAT WITH OR WITHOUT PRESENCE OF ULCERS  *URINARY PROBLEMS  *BOWEL PROBLEMS  UNUSUAL RASH Items with * indicate a potential emergency and should be followed up as soon as possible.  Feel free to call the clinic should you have any questions or concerns. The clinic phone number is [phone].  Please show the [facility] at check-in to the Emergency Department and triage nurse.

## 2017-12-11 NOTE — Progress Notes (Signed)
Hematology and Oncology Follow Up Visit  [name] [id number] October 19, 1945 72 y.o. 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  He has been having a lot of problems with diarrhea.  We find get him on prednisone.  This has helped.  The diarrhea stopped every couple days.  He is now on 40 mg of prednisone.  We will put him on 20 mg a day.  As far as hemochromatosis is concerned, this really is not much of an issue.  His ferritin was 112 with an iron saturation 40% a couple weeks ago.  He just feels so much better.  High dose of prednisone is help with his arthritis.  Is more active.  He is more able to do what he likes.  His appetite is more vigorous.  He has had no problems with urine.  He has had no cough or shortness of breath.  He was on potassium supplement.  I told him to stop the potassium supplementation.  Overall, his performance status is ECOG 1.  Medications:  Allergies as of 12/11/2017      Reactions   Cefepime Hives, Shortness Of Breath   Cephaeline Hives   Morphine And Related Shortness Of Breath, Nausea And Vomiting, Swelling, Other (See Comments)   Agitation, tolerates dilaudid Other reaction(s): Other (See Comments) Agitation, tolerates dilaudid   Penicillins Hives, Shortness Of Breath, Rash   Has patient had a PCN reaction causing immediate rash, facial/tongue/throat swelling, SOB or lightheadedness with hypotension: Yes Has patient had a PCN reaction causing severe rash involving mucus membranes or skin necrosis: Yes Has patient had a PCN reaction that required hospitalization  No Has patient had a PCN reaction occurring within the last 10 years: No If all of the above answers are "NO", then may proceed with Cephalosporin use.   Morphine    Oxycodone-acetaminophen    Doxycycline Rash      Medication List        Accurate as of 12/11/17 11:27 AM. Always use your most recent med list.          albuterol 108 (90 Base) MCG/ACT inhaler Commonly known as:  PROVENTIL HFA;VENTOLIN HFA Inhale 1-2 puffs into the lungs every 4 (four) hours as needed for wheezing or shortness of breath.   alfuzosin 10 MG 24 hr tablet Commonly known as:  UROXATRAL Take 10 mg by mouth at bedtime.   ALPRAZolam 0.5 MG tablet Commonly known as:  XANAX Take 1 tablet (0.5 mg total) by mouth 2 (two) times daily as needed. for anxiety   CIALIS 5 MG tablet Generic drug:  tadalafil Take 5 mg by mouth as needed.   clopidogrel 75 MG tablet Commonly known as:  PLAVIX TAKE 1 TABLET BY MOUTH EVERY DAY WITH BREAKFAST   COLCRYS 0.6 MG tablet Generic drug:  colchicine Take 1 tablet by mouth every evening.   dronabinol 5 MG capsule Commonly known as:  MARINOL Take 1 capsule (5 mg total) by mouth 2 (two) times daily before a meal.   FISH OIL OMEGA-3 1000 MG Caps Take 1,000 mg by mouth daily.   FLUoxetine 40 MG capsule Commonly known  as:  PROZAC TAKE 1 CAPSULE BY MOUTH TWICE DAILY   fluticasone 50 MCG/ACT nasal spray Commonly known as:  FLONASE INSTILL 2 SPRAYS IN EACH NOSTRIL EVERY DAY   gabapentin 300 MG capsule Commonly known as:  NEURONTIN Take 1 capsule (300 mg total) by mouth 2 (two) times daily.   HYDROmorphone 2 MG tablet Commonly known as:  DILAUDID Take 1 tablet (2 mg total) by mouth every 8 (eight) hours as needed for severe pain. Please fill on or after 12/07/17   lidocaine-prilocaine cream Commonly known as:  EMLA Apply 1 application topically as needed.   linaclotide 145 MCG Caps capsule Commonly known as:  LINZESS Take 1 capsule (145 mcg total) by mouth daily  before breakfast.   LORazepam 0.5 MG tablet Commonly known as:  ATIVAN Take 1 tablet (0.5 mg total) by mouth every 8 (eight) hours.   losartan-hydrochlorothiazide 50-12.5 MG tablet Commonly known as:  HYZAAR TAKE 1 TABLET BY MOUTH DAILY   Melatonin 5 MG Tabs Take 5 mg by mouth at bedtime. Takes 10 mg at HS   metoprolol tartrate 25 MG tablet Commonly known as:  LOPRESSOR Take 1 tablet (25 mg total) by mouth 2 (two) times daily.   ondansetron 8 MG tablet Commonly known as:  ZOFRAN Take 1 tablet (8 mg total) by mouth 3 (three) times daily.   pantoprazole 40 MG tablet Commonly known as:  PROTONIX Take 1 tablet (40 mg total) by mouth every evening.   potassium chloride SA 20 MEQ tablet Commonly known as:  K-DUR,KLOR-CON DISSOLVE 4 TABLETS IN 4 TO 8 OUNCES OF WATER AND DRINK BY MOUTH ONCE ON DAY 1 THEN DISSOLVE 2 TABLETS AND DRINK ONCE DAILY FOR 10 DAYS   pravastatin 20 MG tablet Commonly known as:  PRAVACHOL Take 1 tablet (20 mg total) by mouth daily.   predniSONE 5 MG tablet Commonly known as:  DELTASONE Take 5 mg by mouth daily with breakfast.   predniSONE 20 MG tablet Commonly known as:  DELTASONE Take 4 tablets (80 mg total) by mouth daily with breakfast.   prochlorperazine 10 MG tablet Commonly known as:  COMPAZINE TAKE 1 TABLET(10 MG) BY MOUTH EVERY 6 HOURS AS NEEDED FOR NAUSEA OR VOMITING   traZODone 100 MG tablet Commonly known as:  DESYREL TAKE 1 TABLET(100 MG) BY MOUTH AT BEDTIME AS NEEDED FOR SLEEP   vitamin B-12 1000 MCG tablet Commonly known as:  CYANOCOBALAMIN Take 1,000 mcg by mouth daily.   Vitamin D3 2000 units capsule Take 1 capsule (2,000 Units total) by mouth daily.       Allergies:  Allergies  Allergen Reactions  . Cefepime Hives and Shortness Of Breath  . Cephaeline Hives  . Morphine And Related Shortness Of Breath, Nausea And Vomiting, Swelling and Other (See Comments)    Agitation, tolerates dilaudid Other reaction(s): Other (See  Comments) Agitation, tolerates dilaudid  . Penicillins Hives, Shortness Of Breath and Rash    Has patient had a PCN reaction causing immediate rash, facial/tongue/throat swelling, SOB or lightheadedness with hypotension: Yes Has patient had a PCN reaction causing severe rash involving mucus membranes or skin necrosis: Yes Has patient had a PCN reaction that required hospitalization No Has patient had a PCN reaction occurring within the last 10 years: No If all of the above answers are "NO", then may proceed with Cephalosporin use.   [name] Morphine   . Oxycodone-Acetaminophen   . Doxycycline Rash    Past Medical History, Surgical history, Social history, and Family  History were reviewed and updated.  Review of Systems: All other 10 point review of systems is negative.   Physical Exam:  weight is 140 lb (63.5 kg). His oral temperature is 98.1 F (36.7 C). His blood pressure is 110/57 (abnormal) and his pulse is 65. His respiration is 17 and oxygen saturation is 97%.   Wt Readings from Last 3 Encounters:  12/11/17 140 lb (63.5 kg)  12/02/17 134 lb 1 oz (60.8 kg)  11/13/17 137 lb (62.1 kg)    Ocular: Sclerae unicteric, pupils equal, round and reactive to light Ear-nose-throat: Oropharynx clear, dentition fair Lymphatic: No cervical, supraclavicular or axillary adenopathy Lungs no rales or rhonchi, good excursion bilaterally Heart regular rate and rhythm, no murmur appreciated Abd soft, nontender, positive bowel sounds, no liver or spleen tip palpated on exam, no fluid wave  MSK no focal spinal tenderness, no joint edema Neuro: non-focal, well-oriented, appropriate affect Breasts: Deferred   Lab Results  Component Value Date   WBC 8.6 12/11/2017   HGB 13.3 12/11/2017   HCT 39.6 12/11/2017   MCV 92.5 12/11/2017   PLT 233 12/11/2017   Lab Results  Component Value Date   FERRITIN 112 12/02/2017   IRON 67 12/02/2017   TIBC 166 (L) 12/02/2017   UIBC 99 12/02/2017   IRONPCTSAT  40 (L) 12/02/2017   Lab Results  Component Value Date   RETICCTPCT 1.9 03/24/2013   RBC 4.28 12/11/2017   No results found for: KPAFRELGTCHN, LAMBDASER, KAPLAMBRATIO No results found for: Kandis Cocking, IGMSERUM No results found for: Odetta Pink, SPEI   Chemistry      Component Value Date/Time   NA 130 12/11/2017 0925   NA 140 04/10/2017 1046   NA 135 (L) 03/06/2017 1104   K 4.2 12/11/2017 0925   K 3.7 04/10/2017 1046   K 4.2 03/06/2017 1104   CL 100 12/11/2017 0925   CL 98 04/10/2017 1046   CO2 30 12/11/2017 0925   CO2 31 04/10/2017 1046   CO2 30 (H) 03/06/2017 1104   BUN 17 12/11/2017 0925   BUN 21 04/10/2017 1046   BUN 20.0 03/06/2017 1104   CREATININE 1.00 12/11/2017 0925   CREATININE 0.9 04/10/2017 1046   CREATININE 1.0 03/06/2017 1104      Component Value Date/Time   CALCIUM 8.5 12/11/2017 0925   CALCIUM 8.9 04/10/2017 1046   CALCIUM 9.4 03/06/2017 1104   ALKPHOS 60 12/11/2017 0925   ALKPHOS 65 04/10/2017 1046   ALKPHOS 78 03/06/2017 1104   AST 19 12/11/2017 0925   AST 20 03/06/2017 1104   ALT 21 12/11/2017 0925   ALT 21 04/10/2017 1046   ALT 20 03/06/2017 1104   BILITOT 0.6 12/11/2017 0925   BILITOT 0.47 03/06/2017 1104      Impression and Plan: Mr. Schumpert is a very pleasant 72 yo caucasian gentleman with hemochromatosis, homozygous for C282Y mutation.  This really is not much of an issue for his right now.  I am so happy that his diarrhea has resolved.  We will go ahead and try him back on nivolumab.  I think this would be very reasonable.  He is on prednisone.  Will get him down to his maintenance dose of 5 mg of prednisone daily in about a week.  I told him to go to 20 mg a day.  He will do this for 3 days.  He will then do 10 mg a day for 3 days and then 5  mg.  He finishes up the nivolumab in December.     [name], MD 9/5/201911:27 AM

## 2017-12-15 ENCOUNTER — Other Ambulatory Visit: Payer: Self-pay | Admitting: Family

## 2017-12-15 ENCOUNTER — Other Ambulatory Visit: Payer: Self-pay | Admitting: Hematology & Oncology

## 2017-12-15 DIAGNOSIS — C4372 Malignant melanoma of left lower limb, including hip: Secondary | ICD-10-CM

## 2017-12-15 DIAGNOSIS — R112 Nausea with vomiting, unspecified: Secondary | ICD-10-CM

## 2017-12-15 DIAGNOSIS — E876 Hypokalemia: Secondary | ICD-10-CM

## 2017-12-23 ENCOUNTER — Encounter: Payer: Self-pay | Admitting: Internal Medicine

## 2017-12-23 ENCOUNTER — Ambulatory Visit (INDEPENDENT_AMBULATORY_CARE_PROVIDER_SITE_OTHER): Payer: Medicare Other | Admitting: Internal Medicine

## 2017-12-23 VITALS — BP 122/64 | HR 85 | Temp 98.2°F | Ht 66.0 in | Wt 140.0 lb

## 2017-12-23 DIAGNOSIS — M05711 Rheumatoid arthritis with rheumatoid factor of right shoulder without organ or systems involvement: Secondary | ICD-10-CM | POA: Diagnosis not present

## 2017-12-23 DIAGNOSIS — C4372 Malignant melanoma of left lower limb, including hip: Secondary | ICD-10-CM

## 2017-12-23 DIAGNOSIS — Z23 Encounter for immunization: Secondary | ICD-10-CM

## 2017-12-23 DIAGNOSIS — I2510 Atherosclerotic heart disease of native coronary artery without angina pectoris: Secondary | ICD-10-CM

## 2017-12-23 DIAGNOSIS — R112 Nausea with vomiting, unspecified: Secondary | ICD-10-CM | POA: Diagnosis not present

## 2017-12-23 DIAGNOSIS — R197 Diarrhea, unspecified: Secondary | ICD-10-CM

## 2017-12-23 DIAGNOSIS — F419 Anxiety disorder, unspecified: Secondary | ICD-10-CM | POA: Diagnosis not present

## 2017-12-23 MED ORDER — HYDROMORPHONE HCL 2 MG PO TABS: 2.0000 mg | ORAL_TABLET | Freq: Three times a day (TID) | ORAL | 0 refills | Status: DC | PRN

## 2017-12-23 MED ORDER — ALPRAZOLAM 0.5 MG PO TABS: 0.5000 mg | ORAL_TABLET | Freq: Two times a day (BID) | ORAL | 3 refills | Status: DC | PRN

## 2017-12-23 MED ORDER — ONDANSETRON HCL 4 MG/2ML IJ SOLN
4.0000 mg | Freq: Once | INTRAMUSCULAR | Status: AC
  Administered 2017-12-23: 4 mg via INTRAMUSCULAR

## 2017-12-23 NOTE — Assessment & Plan Note (Signed)
He was on prednisone for chemo - he felt very well and it stopped diarrhea; on 10 mg/d now

## 2017-12-23 NOTE — Patient Instructions (Signed)
MC well w/Jill

## 2017-12-23 NOTE — Assessment & Plan Note (Addendum)
Zofran IM/PO

## 2017-12-23 NOTE — Addendum Note (Signed)
Addended by: Karren Cobble on: 12/23/2017 02:26 PM   Modules accepted: Orders

## 2017-12-23 NOTE — Assessment & Plan Note (Signed)
On Chemo

## 2017-12-23 NOTE — Assessment & Plan Note (Addendum)
He was on prednisone for chemo - he felt very well and it stopped diarrhea; on 10 mg/d now   Better on Dilaudid  Potential benefits of a long term opioids use as well as potential risks (i.e. addiction risk, apnea etc) and complications (i.e. Somnolence, constipation and others) were explained to the patient and were aknowledged.

## 2017-12-23 NOTE — Assessment & Plan Note (Signed)
  Xanax prn  Potential benefits of a long term benzodiazepines  use as well as potential risks  and complications were explained to the patient and were aknowledged. Not to take w/dilaudid

## 2017-12-23 NOTE — Progress Notes (Signed)
Subjective:  Patient ID: [name], male    DOB: Aug 04, 1945  Age: 72 y.o. MRN: [id number]  CC: No chief complaint on file.   HPI RAM HAUGAN presents for chronic pain. C/o nausea on chemo. F/u RA. He was on prednisone for chemo - he felt very well and it stopped diarrhea; on 10 mg/d now  Outpatient Medications Prior to Visit  Medication Sig Dispense Refill  . albuterol (PROVENTIL HFA;VENTOLIN HFA) 108 (90 Base) MCG/ACT inhaler Inhale 1-2 puffs into the lungs every 4 (four) hours as needed for wheezing or shortness of breath. 1 Inhaler 1  . alfuzosin (UROXATRAL) 10 MG 24 hr tablet Take 10 mg by mouth at bedtime.   0  . ALPRAZolam (XANAX) 0.5 MG tablet Take 1 tablet (0.5 mg total) by mouth 2 (two) times daily as needed. for anxiety 60 tablet 3  . Cholecalciferol (VITAMIN D3) 2000 UNITS capsule Take 1 capsule (2,000 Units total) by mouth daily. 100 capsule 3  . clopidogrel (PLAVIX) 75 MG tablet TAKE 1 TABLET BY MOUTH EVERY DAY WITH BREAKFAST 90 tablet 3  . COLCRYS 0.6 MG tablet Take 1 tablet by mouth every evening.   2  . dronabinol (MARINOL) 5 MG capsule Take 1 capsule (5 mg total) by mouth 2 (two) times daily before a meal. 60 capsule 2  . FLUoxetine (PROZAC) 40 MG capsule TAKE 1 CAPSULE BY MOUTH TWICE DAILY 180 capsule 0  . fluticasone (FLONASE) 50 MCG/ACT nasal spray INSTILL 2 SPRAYS IN EACH NOSTRIL EVERY DAY 16 g 11  . gabapentin (NEURONTIN) 300 MG capsule Take 1 capsule (300 mg total) by mouth 2 (two) times daily. 180 capsule 1  . HYDROmorphone (DILAUDID) 2 MG tablet Take 1 tablet (2 mg total) by mouth every 8 (eight) hours as needed for severe pain. Please fill on or after 12/07/17 120 tablet 0  . lidocaine-prilocaine (EMLA) cream Apply 1 application topically as needed. 30 g 5  . linaclotide (LINZESS) 145 MCG CAPS capsule Take 1 capsule (145 mcg total) by mouth daily before breakfast. 30 capsule 1  . LORazepam (ATIVAN) 0.5 MG tablet TAKE 1 TABLET(0.5 MG) BY MOUTH EVERY 8  HOURS 30 tablet 0  . losartan-hydrochlorothiazide (HYZAAR) 50-12.5 MG tablet TAKE 1 TABLET BY MOUTH DAILY 90 tablet 1  . Melatonin 5 MG TABS Take 5 mg by mouth at bedtime. Takes 10 mg at HS    . metoprolol tartrate (LOPRESSOR) 25 MG tablet Take 1 tablet (25 mg total) by mouth 2 (two) times daily. 180 tablet 1  . Omega-3 Fatty Acids (FISH OIL OMEGA-3) 1000 MG CAPS Take 1,000 mg by mouth daily.     . ondansetron (ZOFRAN) 8 MG tablet Take 1 tablet (8 mg total) by mouth 3 (three) times daily. 90 tablet 3  . pantoprazole (PROTONIX) 40 MG tablet Take 1 tablet (40 mg total) by mouth every evening. 90 tablet 1  . potassium chloride SA (K-DUR,KLOR-CON) 20 MEQ tablet DISSOLVE 4 TABLETS IN 4 TO 8 OUNCES OF WATER AND DRINK BY MOUTH ONCE ON DAY 1 THEN DISSOLVE 2 TABLETS AND DRINK ONCE DAILY FOR 10 DAYS 216 tablet 0  . pravastatin (PRAVACHOL) 20 MG tablet Take 1 tablet (20 mg total) by mouth daily. (Patient taking differently: Take 20 mg by mouth every evening. ) 90 tablet 1  . predniSONE (DELTASONE) 20 MG tablet TAKE 4 TABLETS(80 MG) BY MOUTH DAILY WITH BREAKFAST 40 tablet 0  . predniSONE (DELTASONE) 5 MG tablet Take 5 mg by mouth  daily with breakfast.    . prochlorperazine (COMPAZINE) 10 MG tablet TAKE 1 TABLET(10 MG) BY MOUTH EVERY 6 HOURS AS NEEDED FOR NAUSEA OR VOMITING 385 tablet 1  . tadalafil (CIALIS) 5 MG tablet Take 5 mg by mouth as needed.    . traZODone (DESYREL) 100 MG tablet TAKE 1 TABLET(100 MG) BY MOUTH AT BEDTIME AS NEEDED FOR SLEEP 30 tablet 2  . vitamin B-12 (CYANOCOBALAMIN) 1000 MCG tablet Take 1,000 mcg by mouth daily.      No facility-administered medications prior to visit.     ROS: Review of Systems  Constitutional: Positive for fatigue. Negative for appetite change and unexpected weight change.  HENT: Negative for congestion, nosebleeds, sneezing, sore throat and trouble swallowing.   Eyes: Negative for itching and visual disturbance.  Respiratory: Negative for cough.     Cardiovascular: Negative for chest pain, palpitations and leg swelling.  Gastrointestinal: Positive for diarrhea, nausea and vomiting. Negative for abdominal distention and blood in stool.  Genitourinary: Negative for frequency and hematuria.  Musculoskeletal: Positive for arthralgias and back pain. Negative for gait problem, joint swelling and neck pain.  Skin: Negative for rash.  Neurological: Positive for weakness. Negative for dizziness, tremors and speech difficulty.  Psychiatric/Behavioral: Negative for agitation, dysphoric mood and sleep disturbance. The patient is not nervous/anxious.     Objective:  BP 122/64 (BP Location: Left Arm, Patient Position: Sitting, Cuff Size: Normal)   Pulse 85   Temp 98.2 F (36.8 C) (Oral)   Ht 5' 6"  (1.676 m)   Wt 140 lb (63.5 kg)   SpO2 94%   BMI 22.60 kg/m   BP Readings from Last 3 Encounters:  12/23/17 122/64  12/11/17 (!) 110/57  12/02/17 (!) 104/55    Wt Readings from Last 3 Encounters:  12/23/17 140 lb (63.5 kg)  12/11/17 140 lb (63.5 kg)  12/02/17 134 lb 1 oz (60.8 kg)    Physical Exam  Constitutional: He is oriented to person, place, and time. He appears well-developed. No distress.  NAD  HENT:  Mouth/Throat: Oropharynx is clear and moist.  Eyes: Pupils are equal, round, and reactive to light. Conjunctivae are normal.  Neck: Normal range of motion. No JVD present. No thyromegaly present.  Cardiovascular: Normal rate, regular rhythm, normal heart sounds and intact distal pulses. Exam reveals no gallop and no friction rub.  No murmur heard. Pulmonary/Chest: Effort normal and breath sounds normal. No respiratory distress. He has no wheezes. He has no rales. He exhibits no tenderness.  Abdominal: Soft. Bowel sounds are normal. He exhibits no distension and no mass. There is no tenderness. There is no rebound and no guarding.  Musculoskeletal: Normal range of motion. He exhibits tenderness. He exhibits no edema.   Lymphadenopathy:    He has no cervical adenopathy.  Neurological: He is alert and oriented to person, place, and time. He has normal reflexes. No cranial nerve deficit. He exhibits normal muscle tone. He displays a negative Romberg sign. Coordination and gait normal.  Skin: Skin is warm and dry. No rash noted.  Psychiatric: He has a normal mood and affect. His behavior is normal. Judgment and thought content normal.  RA deformities  Lab Results  Component Value Date   WBC 8.6 12/11/2017   HGB 13.3 12/11/2017   HCT 39.6 12/11/2017   PLT 233 12/11/2017   GLUCOSE 112 12/11/2017   CHOL 128 03/10/2015   TRIG 95.0 03/10/2015   HDL 39.30 03/10/2015   LDLCALC 70 03/10/2015   ALT 21  12/11/2017   AST 19 12/11/2017   NA 130 12/11/2017   K 4.2 12/11/2017   CL 100 12/11/2017   CREATININE 1.00 12/11/2017   BUN 17 12/11/2017   CO2 30 12/11/2017   TSH 0.680 09/18/2017   PSA 1.95 10/15/2010   INR 1.1 (H) 06/14/2014   HGBA1C 6.0 09/28/2015    Ct Chest W Contrast  Result Date: 10/14/2017 CLINICAL DATA:  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 Restaging assessment. Weight loss. EXAM: CT CHEST, ABDOMEN, AND PELVIS WITH CONTRAST TECHNIQUE: Multidetector CT imaging of the chest, abdomen and pelvis was performed following the standard protocol during bolus administration of intravenous contrast. CONTRAST:  194m ISOVUE-300 IOPAMIDOL (ISOVUE-300) INJECTION 61% COMPARISON:  Multiple exams, including 03/07/2017 PET-CT FINDINGS: CT CHEST FINDINGS Cardiovascular: Structures of the thoracic inlet obscured by streak artifact from the patient's bilateral proximal humeral/shoulder implants. Coronary, aortic arch, and branch vessel atherosclerotic vascular disease. Left Port-A-Cath tip: SVC. Mediastinum/Nodes: No pathologic thoracic adenopathy. Lungs/Pleura: Elevated right hemidiaphragm with atelectasis in the right middle lobe and right lower lobe. This appearance is similar to 03/07/2017.  Musculoskeletal: Lower cervical and thoracic spondylosis and degenerative disc disease. CT ABDOMEN PELVIS FINDINGS Hepatobiliary: Unremarkable Pancreas: Unremarkable Spleen: Unremarkable Adrenals/Urinary Tract: Both adrenal glands appear normal. Bilateral renal cysts including a right parapelvic cyst. Urinary bladder unremarkable. Stomach/Bowel: Descending and sigmoid colon diverticulosis. Prominent stool throughout the colon favors constipation. Vascular/Lymphatic: Aortoiliac atherosclerotic vascular disease. No pathologic adenopathy is identified. Reproductive: Right greater than left scrotal hydroceles. Other: Small right supraumbilical subcutaneous nodule 1.8 by 1.0 cm, not readily apparent on 03/07/2017 but possibly obscured by motion artifact. This is most likely a sebaceous cyst or similar benign lesion, but technically nonspecific. Musculoskeletal: Degenerative hip arthropathy. Posterolateral rod and pedicle screw fixation bilaterally at all lumbar levels and in the sacrum with wide bony decompression from L1 through the upper sacrum. The sacral pedicle screws are fractured. Similarly, the right L4 pedicle screw head appears to be fractured away from the posterolateral rod. The left L1, right L3, and right L4 pedicle screws appear to capture the respective medial pedicle cortex. IMPRESSION: 1. No specific findings of active malignancy in the chest, abdomen, or pelvis. There is a subcutaneous nodule along the anterior abdominal wall just to the right of midline in the supraumbilical region measuring 1.8 by 1.0 cm which merit surveillance. There is no hypermetabolic activity in this vicinity on the prior PET-CT of 03/07/2017. 2. Hardware failures involving the lumbosacral posterolateral rod and pedicle screw system, including fractured S1 pedicle screws, and a fracture of the right L4 pedicle screw head. 3. Chronic elevation of the right hemidiaphragm with right basilar atelectasis. 4. Other imaging findings  of potential clinical significance: Prominent stool throughout the colon favors constipation. Descending and sigmoid colon diverticulosis. Aortic Atherosclerosis (ICD10-I70.0). Scrotal hydroceles. Coronary atherosclerosis. Electronically Signed   By: WVan ClinesM.D.   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 Restaging assessment. Weight loss. EXAM: CT CHEST, ABDOMEN, AND PELVIS WITH CONTRAST TECHNIQUE: Multidetector CT imaging of the chest, abdomen and pelvis was performed following the standard protocol during bolus administration of intravenous contrast. CONTRAST:  1028mISOVUE-300 IOPAMIDOL (ISOVUE-300) INJECTION 61% COMPARISON:  Multiple exams, including 03/07/2017 PET-CT FINDINGS: CT CHEST FINDINGS Cardiovascular: Structures of the thoracic inlet obscured by streak artifact from the patient's bilateral proximal humeral/shoulder implants. Coronary, aortic arch, and branch vessel atherosclerotic  vascular disease. Left Port-A-Cath tip: SVC. Mediastinum/Nodes: No pathologic thoracic adenopathy. Lungs/Pleura: Elevated right hemidiaphragm with atelectasis in the right middle lobe and right lower lobe. This appearance is similar to 03/07/2017. Musculoskeletal: Lower cervical and thoracic spondylosis and degenerative disc disease. CT ABDOMEN PELVIS FINDINGS Hepatobiliary: Unremarkable Pancreas: Unremarkable Spleen: Unremarkable Adrenals/Urinary Tract: Both adrenal glands appear normal. Bilateral renal cysts including a right parapelvic cyst. Urinary bladder unremarkable. Stomach/Bowel: Descending and sigmoid colon diverticulosis. Prominent stool throughout the colon favors constipation. Vascular/Lymphatic: Aortoiliac atherosclerotic vascular disease. No pathologic adenopathy is identified. Reproductive: Right greater than left scrotal hydroceles. Other: Small right supraumbilical subcutaneous  nodule 1.8 by 1.0 cm, not readily apparent on 03/07/2017 but possibly obscured by motion artifact. This is most likely a sebaceous cyst or similar benign lesion, but technically nonspecific. Musculoskeletal: Degenerative hip arthropathy. Posterolateral rod and pedicle screw fixation bilaterally at all lumbar levels and in the sacrum with wide bony decompression from L1 through the upper sacrum. The sacral pedicle screws are fractured. Similarly, the right L4 pedicle screw head appears to be fractured away from the posterolateral rod. The left L1, right L3, and right L4 pedicle screws appear to capture the respective medial pedicle cortex. IMPRESSION: 1. No specific findings of active malignancy in the chest, abdomen, or pelvis. There is a subcutaneous nodule along the anterior abdominal wall just to the right of midline in the supraumbilical region measuring 1.8 by 1.0 cm which merit surveillance. There is no hypermetabolic activity in this vicinity on the prior PET-CT of 03/07/2017. 2. Hardware failures involving the lumbosacral posterolateral rod and pedicle screw system, including fractured S1 pedicle screws, and a fracture of the right L4 pedicle screw head. 3. Chronic elevation of the right hemidiaphragm with right basilar atelectasis. 4. Other imaging findings of potential clinical significance: Prominent stool throughout the colon favors constipation. Descending and sigmoid colon diverticulosis. Aortic Atherosclerosis (ICD10-I70.0). Scrotal hydroceles. Coronary atherosclerosis. Electronically Signed   By: Van Clines M.D.   On: 10/14/2017 14:16    Assessment & Plan:   There are no diagnoses linked to this encounter.   No orders of the defined types were placed in this encounter.    Follow-up: No follow-ups on file.  [name], MD

## 2017-12-25 ENCOUNTER — Other Ambulatory Visit: Payer: Self-pay

## 2017-12-25 MED ORDER — TRAZODONE HCL 100 MG PO TABS: ORAL_TABLET | ORAL | 2 refills | Status: DC

## 2017-12-26 ENCOUNTER — Telehealth: Payer: Self-pay | Admitting: *Deleted

## 2017-12-26 ENCOUNTER — Other Ambulatory Visit: Payer: Self-pay | Admitting: *Deleted

## 2017-12-26 DIAGNOSIS — M069 Rheumatoid arthritis, unspecified: Secondary | ICD-10-CM | POA: Diagnosis not present

## 2017-12-26 DIAGNOSIS — C779 Secondary and unspecified malignant neoplasm of lymph node, unspecified: Secondary | ICD-10-CM | POA: Diagnosis not present

## 2017-12-26 DIAGNOSIS — C439 Malignant melanoma of skin, unspecified: Secondary | ICD-10-CM | POA: Diagnosis not present

## 2017-12-26 DIAGNOSIS — C4372 Malignant melanoma of left lower limb, including hip: Secondary | ICD-10-CM | POA: Diagnosis not present

## 2017-12-26 MED ORDER — PREDNISONE 20 MG PO TABS: 20.0000 mg | ORAL_TABLET | Freq: Every day | ORAL | 0 refills | Status: DC

## 2017-12-26 NOTE — Telephone Encounter (Signed)
Message received from patient stating that his diarrhea is back and uncontrollable and that he is currently taking Prednisone 10 mg daily.  Dr. Marin Olp notified and order received for pt to start Prednisone back at 80 mg daily x 5 days, then decrease to 60 mg daily x 5 days, then decrease to 40 mg daily x 5 days, then decrease to 20 mg daily x 5 days and then 10 mg daily.  Call placed to patient and patient notified of MD orders.  Teach back done.  Patient appreciative of call back and has no questions at this time.

## 2018-01-08 ENCOUNTER — Inpatient Hospital Stay: Payer: Medicare Other

## 2018-01-08 ENCOUNTER — Inpatient Hospital Stay: Payer: Medicare Other | Attending: Hematology & Oncology | Admitting: Hematology & Oncology

## 2018-01-08 ENCOUNTER — Other Ambulatory Visit: Payer: Self-pay

## 2018-01-08 ENCOUNTER — Encounter: Payer: Self-pay | Admitting: Hematology & Oncology

## 2018-01-08 VITALS — BP 108/61 | HR 66 | Temp 98.0°F | Resp 18 | Wt 144.8 lb

## 2018-01-08 DIAGNOSIS — Z79899 Other long term (current) drug therapy: Secondary | ICD-10-CM | POA: Diagnosis not present

## 2018-01-08 DIAGNOSIS — C4372 Malignant melanoma of left lower limb, including hip: Secondary | ICD-10-CM

## 2018-01-08 DIAGNOSIS — R197 Diarrhea, unspecified: Secondary | ICD-10-CM

## 2018-01-08 DIAGNOSIS — R609 Edema, unspecified: Secondary | ICD-10-CM | POA: Diagnosis not present

## 2018-01-08 DIAGNOSIS — D500 Iron deficiency anemia secondary to blood loss (chronic): Secondary | ICD-10-CM

## 2018-01-08 DIAGNOSIS — Z5112 Encounter for antineoplastic immunotherapy: Secondary | ICD-10-CM | POA: Diagnosis not present

## 2018-01-08 LAB — CMP (CANCER CENTER ONLY)
ALT: 29 U/L (ref 10–47)
AST: 20 U/L (ref 11–38)
Albumin: 2.9 g/dL — ABNORMAL LOW (ref 3.5–5.0)
Alkaline Phosphatase: 53 U/L (ref 26–84)
Anion gap: 2 — ABNORMAL LOW (ref 5–15)
BUN: 19 mg/dL (ref 7–22)
CO2: 31 mmol/L (ref 18–33)
Calcium: 8.3 mg/dL (ref 8.0–10.3)
Chloride: 101 mmol/L (ref 98–108)
Creatinine: 1.1 mg/dL (ref 0.60–1.20)
Glucose, Bld: 144 mg/dL — ABNORMAL HIGH (ref 73–118)
Potassium: 3.5 mmol/L (ref 3.3–4.7)
Sodium: 134 mmol/L (ref 128–145)
Total Bilirubin: 0.6 mg/dL (ref 0.2–1.6)
Total Protein: 5.3 g/dL — ABNORMAL LOW (ref 6.4–8.1)

## 2018-01-08 LAB — CBC WITH DIFFERENTIAL (CANCER CENTER ONLY)
Basophils Absolute: 0 10*3/uL (ref 0.0–0.1)
Basophils Relative: 0 %
Eosinophils Absolute: 0 10*3/uL (ref 0.0–0.5)
Eosinophils Relative: 0 %
HCT: 38.6 % — ABNORMAL LOW (ref 38.7–49.9)
Hemoglobin: 12.6 g/dL — ABNORMAL LOW (ref 13.0–17.1)
Lymphocytes Relative: 9 %
Lymphs Abs: 1 10*3/uL (ref 0.9–3.3)
MCH: 31.3 pg (ref 28.0–33.4)
MCHC: 32.6 g/dL (ref 32.0–35.9)
MCV: 95.8 fL (ref 82.0–98.0)
Monocytes Absolute: 0.8 10*3/uL (ref 0.1–0.9)
Monocytes Relative: 7 %
Neutro Abs: 9 10*3/uL — ABNORMAL HIGH (ref 1.5–6.5)
Neutrophils Relative %: 84 %
Platelet Count: 214 10*3/uL (ref 145–400)
RBC: 4.03 MIL/uL — ABNORMAL LOW (ref 4.20–5.70)
RDW: 13.9 % (ref 11.1–15.7)
WBC Count: 10.7 10*3/uL — ABNORMAL HIGH (ref 4.0–10.0)

## 2018-01-08 LAB — LACTATE DEHYDROGENASE: LDH: 181 U/L (ref 98–192)

## 2018-01-08 MED ORDER — SODIUM CHLORIDE 0.9% FLUSH
10.0000 mL | INTRAVENOUS | Status: DC | PRN
  Administered 2018-01-08: 10 mL
  Filled 2018-01-08: qty 10

## 2018-01-08 MED ORDER — SODIUM CHLORIDE 0.9 % IV SOLN
Freq: Once | INTRAVENOUS | Status: AC
  Administered 2018-01-08: 10:00:00 via INTRAVENOUS
  Filled 2018-01-08: qty 250

## 2018-01-08 MED ORDER — HEPARIN SOD (PORK) LOCK FLUSH 100 UNIT/ML IV SOLN
500.0000 [IU] | Freq: Once | INTRAVENOUS | Status: AC | PRN
  Administered 2018-01-08: 500 [IU]
  Filled 2018-01-08: qty 5

## 2018-01-08 MED ORDER — SODIUM CHLORIDE 0.9 % IV SOLN
480.0000 mg | Freq: Once | INTRAVENOUS | Status: AC
  Administered 2018-01-08: 480 mg via INTRAVENOUS
  Filled 2018-01-08: qty 48

## 2018-01-08 MED ORDER — SODIUM CHLORIDE 0.9 % IJ SOLN
10.0000 mL | Freq: Once | INTRAMUSCULAR | Status: AC
  Administered 2018-01-08: 10 mL
  Filled 2018-01-08: qty 10

## 2018-01-08 NOTE — Progress Notes (Signed)
Hematology and Oncology Follow Up Visit  [name] [id number] [birth year] 72 y.o. 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 Rudin is here today with with his wife for follow-up.  He is doing okay.  The prednisone really helped his diarrhea.  However, when we started getting his prednisone dose down a little bit, the diarrhea came back.  He is now on prednisone 40 mg a day.  I told him to alternate 40 mg a day with 20 mg a day for 1 week and then go down to 20 mg a day.  He has very good appetite.  Unfortunately he does have a lot of swelling in his feet and lower legs.  The left leg is little worse than the right leg.  I am thinking about trying a diuretic on him.  However, his blood pressure is not that great and I do not want to drop the blood pressure that much.  Hopefully the edema in the legs will get better as he tapers down his prednisone.  He also has the hemochromatosis.  His ferritin is 84 and iron saturation was 96%.  This was about a month ago.  I am not sure that we really can phlebotomize him right now.  There is been no fever.  He has had no cough or shortness of breath.  He has had no rashes.  Overall, his performance status is ECOG 1.  Medications:  Allergies as of 01/08/2018      Reactions   Cefepime Hives, Shortness Of Breath   Cephaeline Hives   Morphine And Related Shortness Of Breath, Nausea And Vomiting, Swelling, Other (See Comments)   Agitation, tolerates dilaudid Other reaction(s): Other (See Comments) Agitation, tolerates dilaudid   Penicillins Hives, Shortness Of Breath, Rash   Has patient had a PCN reaction causing  immediate rash, facial/tongue/throat swelling, SOB or lightheadedness with hypotension: Yes Has patient had a PCN reaction causing severe rash involving mucus membranes or skin necrosis: Yes Has patient had a PCN reaction that required hospitalization No Has patient had a PCN reaction occurring within the last 10 years: No If all of the above answers are "NO", then may proceed with Cephalosporin use.   Morphine    Oxycodone-acetaminophen    Doxycycline Rash      Medication List        Accurate as of 01/08/18  9:19 AM. Always use your most recent med list.          albuterol 108 (90 Base) MCG/ACT inhaler Commonly known as:  PROVENTIL HFA;VENTOLIN HFA Inhale 1-2 puffs into the lungs every 4 (four) hours as needed for wheezing or shortness of breath.   alfuzosin 10 MG 24 hr tablet Commonly known as:  UROXATRAL Take 10 mg by mouth at bedtime.   ALPRAZolam 0.5 MG tablet Commonly known as:  XANAX Take 1 tablet (0.5 mg total) by mouth 2 (two) times daily as needed. for anxiety   CIALIS 5 MG tablet Generic drug:  tadalafil Take 5 mg by mouth as needed.   clopidogrel 75 MG tablet Commonly known as:  PLAVIX TAKE 1 TABLET BY MOUTH EVERY DAY WITH BREAKFAST   COLCRYS 0.6 MG tablet Generic drug:  colchicine Take 1 tablet by mouth every evening.   dronabinol 5 MG capsule Commonly known as:  MARINOL Take 1 capsule (5 mg total) by mouth 2 (two) times daily before a meal.   FISH OIL OMEGA-3 1000 MG Caps Take 1,000 mg by mouth daily.   FLUoxetine 40 MG capsule Commonly known as:  PROZAC TAKE 1 CAPSULE BY MOUTH TWICE DAILY   fluticasone 50 MCG/ACT nasal spray Commonly known as:  FLONASE INSTILL 2 SPRAYS IN EACH NOSTRIL EVERY DAY   gabapentin 300 MG capsule Commonly known as:  NEURONTIN Take 1 capsule (300 mg total) by mouth 2 (two) times daily.   HYDROmorphone 2 MG tablet Commonly known as:  DILAUDID Take 1 tablet (2 mg total) by mouth every 8 (eight) hours as needed for  severe pain. Please fill on or after 02/12/18   HYDROmorphone 2 MG tablet Commonly known as:  DILAUDID Take 1 tablet (2 mg total) by mouth every 8 (eight) hours as needed for severe pain. Please fill on or after 02/12/18   lidocaine-prilocaine cream Commonly known as:  EMLA Apply 1 application topically as needed.   linaclotide 145 MCG Caps capsule Commonly known as:  LINZESS Take 1 capsule (145 mcg total) by mouth daily before breakfast.   LORazepam 0.5 MG tablet Commonly known as:  ATIVAN TAKE 1 TABLET(0.5 MG) BY MOUTH EVERY 8 HOURS   losartan-hydrochlorothiazide 50-12.5 MG tablet Commonly known as:  HYZAAR TAKE 1 TABLET BY MOUTH DAILY   Melatonin 5 MG Tabs Take 5 mg by mouth at bedtime. Takes 10 mg at HS   metoprolol tartrate 25 MG tablet Commonly known as:  LOPRESSOR Take 1 tablet (25 mg total) by mouth 2 (two) times daily.   ondansetron 8 MG tablet Commonly known as:  ZOFRAN Take 1 tablet (8 mg total) by mouth 3 (three) times daily.   pantoprazole 40 MG tablet Commonly known as:  PROTONIX Take 1 tablet (40 mg total) by mouth every evening.   potassium chloride SA 20 MEQ tablet Commonly known as:  K-DUR,KLOR-CON DISSOLVE 4 TABLETS IN 4 TO 8 OUNCES OF WATER AND DRINK BY MOUTH ONCE ON DAY 1 THEN DISSOLVE 2 TABLETS AND DRINK ONCE DAILY FOR 10 DAYS   pravastatin 20 MG tablet Commonly known as:  PRAVACHOL Take 1 tablet (20 mg total) by mouth daily.   predniSONE 5 MG tablet Commonly known as:  DELTASONE Take 5 mg by mouth daily with breakfast.   predniSONE 20 MG tablet Commonly known as:  DELTASONE Take 1 tablet (20 mg total) by mouth daily with breakfast.   prochlorperazine 10 MG tablet Commonly known as:  COMPAZINE TAKE 1 TABLET(10 MG) BY MOUTH EVERY 6 HOURS AS NEEDED FOR NAUSEA OR VOMITING   traZODone 100 MG tablet Commonly known as:  DESYREL TAKE 1 TABLET(100 MG) BY MOUTH AT BEDTIME AS NEEDED FOR SLEEP   vitamin B-12 1000 MCG tablet Commonly known as:   CYANOCOBALAMIN Take 1,000 mcg by mouth daily.   Vitamin D3 2000 units capsule Take 1 capsule (2,000 Units total) by mouth daily.       Allergies:  Allergies  Allergen Reactions  . Cefepime Hives and Shortness Of Breath  . Cephaeline Hives  . Morphine And Related Shortness Of Breath, Nausea And Vomiting, Swelling and Other (See Comments)    Agitation, tolerates dilaudid Other reaction(s): Other (See Comments) Agitation, tolerates dilaudid  . Penicillins Hives, Shortness Of Breath and Rash    Has patient had a PCN reaction causing immediate  rash, facial/tongue/throat swelling, SOB or lightheadedness with hypotension: Yes Has patient had a PCN reaction causing severe rash involving mucus membranes or skin necrosis: Yes Has patient had a PCN reaction that required hospitalization No Has patient had a PCN reaction occurring within the last 10 years: No If all of the above answers are "NO", then may proceed with Cephalosporin use.   [name] Morphine   . Oxycodone-Acetaminophen   . Doxycycline Rash    Past Medical History, Surgical history, Social history, and Family History were reviewed and updated.  Review of Systems: Review of Systems  Constitutional: Negative.   HENT: Negative.   Eyes: Negative.   Respiratory: Negative.   Cardiovascular: Positive for leg swelling.  Gastrointestinal: Positive for diarrhea.  Genitourinary: Negative.   Musculoskeletal: Positive for joint pain.  Skin: Negative.   Neurological: Negative.   Endo/Heme/Allergies: Negative.   Psychiatric/Behavioral: Negative.      Physical Exam:  weight is 144 lb 12 oz (65.7 kg). His oral temperature is 98 F (36.7 C). His blood pressure is 108/61 and his pulse is 66. His respiration is 18 and oxygen saturation is 97%.   Wt Readings from Last 3 Encounters:  01/08/18 144 lb 12 oz (65.7 kg)  12/23/17 140 lb (63.5 kg)  12/11/17 140 lb (63.5 kg)    Physical Exam  Constitutional: He is oriented to person, place,  and time.  HENT:  Head: Normocephalic and atraumatic.  Mouth/Throat: Oropharynx is clear and moist.  Eyes: Pupils are equal, round, and reactive to light. EOM are normal.  Neck: Normal range of motion.  Cardiovascular: Normal rate, regular rhythm and normal heart sounds.  Pulmonary/Chest: Effort normal and breath sounds normal.  Abdominal: Soft. Bowel sounds are normal.  Musculoskeletal: Normal range of motion. He exhibits no edema, tenderness or deformity.  Lymphadenopathy:    He has no cervical adenopathy.  Neurological: He is alert and oriented to person, place, and time.  Skin: Skin is warm and dry. No rash noted. No erythema.  Psychiatric: He has a normal mood and affect. His behavior is normal. Judgment and thought content normal.  Vitals reviewed.    Lab Results  Component Value Date   WBC 10.7 (H) 01/08/2018   HGB 12.6 (L) 01/08/2018   HCT 38.6 (L) 01/08/2018   MCV 95.8 01/08/2018   PLT 214 01/08/2018   Lab Results  Component Value Date   FERRITIN 84 12/11/2017   IRON 166 (H) 12/11/2017   TIBC 174 (L) 12/11/2017   UIBC 8 12/11/2017   IRONPCTSAT 96 12/11/2017   Lab Results  Component Value Date   RETICCTPCT 1.9 03/24/2013   RBC 4.03 (L) 01/08/2018   No results found for: KPAFRELGTCHN, LAMBDASER, KAPLAMBRATIO No results found for: IGGSERUM, IGA, IGMSERUM No results found for: Odetta Pink, SPEI   Chemistry      Component Value Date/Time   NA 134 01/08/2018 0830   NA 140 04/10/2017 1046   NA 135 (L) 03/06/2017 1104   K 3.5 01/08/2018 0830   K 3.7 04/10/2017 1046   K 4.2 03/06/2017 1104   CL 101 01/08/2018 0830   CL 98 04/10/2017 1046   CO2 31 01/08/2018 0830   CO2 31 04/10/2017 1046   CO2 30 (H) 03/06/2017 1104   BUN 19 01/08/2018 0830   BUN 21 04/10/2017 1046   BUN 20.0 03/06/2017 1104   CREATININE 1.10 01/08/2018 0830   CREATININE 0.9 04/10/2017 1046   CREATININE 1.0 03/06/2017 1104  Component Value Date/Time   CALCIUM 8.3 01/08/2018 0830   CALCIUM 8.9 04/10/2017 1046   CALCIUM 9.4 03/06/2017 1104   ALKPHOS 53 01/08/2018 0830   ALKPHOS 65 04/10/2017 1046   ALKPHOS 78 03/06/2017 1104   AST 20 01/08/2018 0830   AST 20 03/06/2017 1104   ALT 29 01/08/2018 0830   ALT 21 04/10/2017 1046   ALT 20 03/06/2017 1104   BILITOT 0.6 01/08/2018 0830   BILITOT 0.47 03/06/2017 1104      Impression and Plan: Mr. Palmieri is a very pleasant 72 yo caucasian gentleman with hemochromatosis, homozygous for C282Y mutation.  This really is not much of an issue for his right now.  We are getting close to the end of his adjuvant nivolumab.  We will get him through November and then that will be his last dose.  After he is done with the nivolumab, will then have to watch for the hemochromatosis.  I know that this is been a "balancing that" with respect to getting iron out of him but also watching out for iron deficiency.  We will plan to have him come back in 4 weeks for his next cycle of nivolumab.  Hopefully, the diarrhea will continue to improve.  If not, we might need to think about infliximab.     [name], MD 10/3/20199:19 AM

## 2018-01-08 NOTE — Patient Instructions (Signed)
[place] Discharge Instructions for Patients Receiving Chemotherapy  Today you received the following chemotherapy agents Nivolumab To help prevent nausea and vomiting after your treatment, we encourage you to take your nausea medication as prescribed.   If you develop nausea and vomiting that is not controlled by your nausea medication, call the clinic.   BELOW ARE SYMPTOMS THAT SHOULD BE REPORTED IMMEDIATELY:  *FEVER GREATER THAN 100.5 F  *CHILLS WITH OR WITHOUT FEVER  NAUSEA AND VOMITING THAT IS NOT CONTROLLED WITH YOUR NAUSEA MEDICATION  *UNUSUAL SHORTNESS OF BREATH  *UNUSUAL BRUISING OR BLEEDING  TENDERNESS IN MOUTH AND THROAT WITH OR WITHOUT PRESENCE OF ULCERS  *URINARY PROBLEMS  *BOWEL PROBLEMS  UNUSUAL RASH Items with * indicate a potential emergency and should be followed up as soon as possible.  Feel free to call the clinic should you have any questions or concerns. The clinic phone number is [phone].  Please show the [facility] at check-in to the Emergency Department and triage nurse.

## 2018-01-08 NOTE — Progress Notes (Signed)
Patient is on a prednisone taper to control adverse effects of nivolumab.  He is doing well. Okay to treat with nivolumab today per Dr. Marin Olp.

## 2018-01-08 NOTE — Patient Instructions (Signed)
Implanted Port Home Guide An implanted port is a type of central line that is placed under the skin. Central lines are used to provide IV access when treatment or nutrition needs to be given through a person's veins. Implanted ports are used for long-term IV access. An implanted port may be placed because:  You need IV medicine that would be irritating to the small veins in your hands or arms.  You need long-term IV medicines, such as antibiotics.  You need IV nutrition for a long period.  You need frequent blood draws for lab tests.  You need dialysis.  Implanted ports are usually placed in the chest area, but they can also be placed in the upper arm, the abdomen, or the leg. An implanted port has two main parts:  Reservoir. The reservoir is round and will appear as a small, raised area under your skin. The reservoir is the part where a needle is inserted to give medicines or draw blood.  Catheter. The catheter is a thin, flexible tube that extends from the reservoir. The catheter is placed into a large vein. Medicine that is inserted into the reservoir goes into the catheter and then into the vein.  How will I care for my incision site? Do not get the incision site wet. Bathe or shower as directed by your health care provider. How is my port accessed? Special steps must be taken to access the port:  Before the port is accessed, a numbing cream can be placed on the skin. This helps numb the skin over the port site.  Your health care provider uses a sterile technique to access the port. ? Your health care provider must put on a mask and sterile gloves. ? The skin over your port is cleaned carefully with an antiseptic and allowed to dry. ? The port is gently pinched between sterile gloves, and a needle is inserted into the port.  Only "non-coring" port needles should be used to access the port. Once the port is accessed, a blood return should be checked. This helps ensure that the port  is in the vein and is not clogged.  If your port needs to remain accessed for a constant infusion, a clear (transparent) bandage will be placed over the needle site. The bandage and needle will need to be changed every week, or as directed by your health care provider.  Keep the bandage covering the needle clean and dry. Do not get it wet. Follow your health care provider's instructions on how to take a shower or bath while the port is accessed.  If your port does not need to stay accessed, no bandage is needed over the port.  What is flushing? Flushing helps keep the port from getting clogged. Follow your health care provider's instructions on how and when to flush the port. Ports are usually flushed with saline solution or a medicine called heparin. The need for flushing will depend on how the port is used.  If the port is used for intermittent medicines or blood draws, the port will need to be flushed: ? After medicines have been given. ? After blood has been drawn. ? As part of routine maintenance.  If a constant infusion is running, the port may not need to be flushed.  How long will my port stay implanted? The port can stay in for as long as your health care provider thinks it is needed. When it is time for the port to come out, surgery will be   done to remove it. The procedure is similar to the one performed when the port was put in. When should I seek immediate medical care? When you have an implanted port, you should seek immediate medical care if:  You notice a bad smell coming from the incision site.  You have swelling, redness, or drainage at the incision site.  You have more swelling or pain at the port site or the surrounding area.  You have a fever that is not controlled with medicine.  This information is not intended to replace advice given to you by your health care provider. Make sure you discuss any questions you have with your health care provider. Document  Released: 03/25/2005 Document Revised: 08/31/2015 Document Reviewed: 11/30/2012 Elsevier Interactive Patient Education  2017 [employer]  

## 2018-01-09 LAB — IRON AND TIBC
Iron: 124 ug/dL (ref 42–163)
Saturation Ratios: 75 % (ref 42–163)
TIBC: 166 ug/dL — ABNORMAL LOW (ref 202–409)
UIBC: 42 ug/dL

## 2018-01-09 LAB — FERRITIN: Ferritin: 97 ng/mL (ref 24–336)

## 2018-01-12 DIAGNOSIS — L720 Epidermal cyst: Secondary | ICD-10-CM | POA: Diagnosis not present

## 2018-01-12 DIAGNOSIS — Z85820 Personal history of malignant melanoma of skin: Secondary | ICD-10-CM | POA: Diagnosis not present

## 2018-01-14 ENCOUNTER — Other Ambulatory Visit: Payer: Self-pay | Admitting: Internal Medicine

## 2018-01-19 DIAGNOSIS — Z4802 Encounter for removal of sutures: Secondary | ICD-10-CM | POA: Diagnosis not present

## 2018-01-23 DIAGNOSIS — D1801 Hemangioma of skin and subcutaneous tissue: Secondary | ICD-10-CM | POA: Diagnosis not present

## 2018-01-23 DIAGNOSIS — L565 Disseminated superficial actinic porokeratosis (DSAP): Secondary | ICD-10-CM | POA: Diagnosis not present

## 2018-01-23 DIAGNOSIS — D2271 Melanocytic nevi of right lower limb, including hip: Secondary | ICD-10-CM | POA: Diagnosis not present

## 2018-01-23 DIAGNOSIS — D225 Melanocytic nevi of trunk: Secondary | ICD-10-CM | POA: Diagnosis not present

## 2018-01-23 DIAGNOSIS — L720 Epidermal cyst: Secondary | ICD-10-CM | POA: Diagnosis not present

## 2018-01-23 DIAGNOSIS — L853 Xerosis cutis: Secondary | ICD-10-CM | POA: Diagnosis not present

## 2018-01-23 DIAGNOSIS — Z85820 Personal history of malignant melanoma of skin: Secondary | ICD-10-CM | POA: Diagnosis not present

## 2018-01-23 DIAGNOSIS — D692 Other nonthrombocytopenic purpura: Secondary | ICD-10-CM | POA: Diagnosis not present

## 2018-01-23 DIAGNOSIS — L821 Other seborrheic keratosis: Secondary | ICD-10-CM | POA: Diagnosis not present

## 2018-02-05 ENCOUNTER — Inpatient Hospital Stay: Payer: Medicare Other

## 2018-02-05 ENCOUNTER — Inpatient Hospital Stay (HOSPITAL_BASED_OUTPATIENT_CLINIC_OR_DEPARTMENT_OTHER): Payer: Medicare Other | Admitting: Family

## 2018-02-05 DIAGNOSIS — M069 Rheumatoid arthritis, unspecified: Secondary | ICD-10-CM | POA: Diagnosis not present

## 2018-02-05 DIAGNOSIS — Z5112 Encounter for antineoplastic immunotherapy: Secondary | ICD-10-CM | POA: Diagnosis not present

## 2018-02-05 DIAGNOSIS — R634 Abnormal weight loss: Secondary | ICD-10-CM

## 2018-02-05 DIAGNOSIS — C4372 Malignant melanoma of left lower limb, including hip: Secondary | ICD-10-CM

## 2018-02-05 DIAGNOSIS — D500 Iron deficiency anemia secondary to blood loss (chronic): Secondary | ICD-10-CM

## 2018-02-05 DIAGNOSIS — Z79899 Other long term (current) drug therapy: Secondary | ICD-10-CM | POA: Diagnosis not present

## 2018-02-05 LAB — FERRITIN: Ferritin: 123 ng/mL (ref 24–336)

## 2018-02-05 LAB — CMP (CANCER CENTER ONLY)
ALT: 19 U/L (ref 10–47)
AST: 18 U/L (ref 11–38)
Albumin: 3.1 g/dL — ABNORMAL LOW (ref 3.5–5.0)
Alkaline Phosphatase: 61 U/L (ref 26–84)
Anion gap: 4 — ABNORMAL LOW (ref 5–15)
BUN: 22 mg/dL (ref 7–22)
CO2: 30 mmol/L (ref 18–33)
Calcium: 8.8 mg/dL (ref 8.0–10.3)
Chloride: 102 mmol/L (ref 98–108)
Creatinine: 0.8 mg/dL (ref 0.60–1.20)
Glucose, Bld: 106 mg/dL (ref 73–118)
Potassium: 3.5 mmol/L (ref 3.3–4.7)
Sodium: 136 mmol/L (ref 128–145)
Total Bilirubin: 0.7 mg/dL (ref 0.2–1.6)
Total Protein: 5.7 g/dL — ABNORMAL LOW (ref 6.4–8.1)

## 2018-02-05 LAB — IRON AND TIBC
Iron: 95 ug/dL (ref 42–163)
Saturation Ratios: 54 % (ref 20–55)
TIBC: 176 ug/dL — ABNORMAL LOW (ref 202–409)
UIBC: 81 ug/dL — ABNORMAL LOW (ref 117–376)

## 2018-02-05 LAB — CBC WITH DIFFERENTIAL (CANCER CENTER ONLY)
Abs Immature Granulocytes: 0.1 10*3/uL — ABNORMAL HIGH (ref 0.00–0.07)
Basophils Absolute: 0 10*3/uL (ref 0.0–0.1)
Basophils Relative: 0 %
Eosinophils Absolute: 0 10*3/uL (ref 0.0–0.5)
Eosinophils Relative: 0 %
HCT: 36.1 % — ABNORMAL LOW (ref 39.0–52.0)
Hemoglobin: 11.8 g/dL — ABNORMAL LOW (ref 13.0–17.0)
Immature Granulocytes: 1 %
Lymphocytes Relative: 15 %
Lymphs Abs: 1.3 10*3/uL (ref 0.7–4.0)
MCH: 31.1 pg (ref 26.0–34.0)
MCHC: 32.7 g/dL (ref 30.0–36.0)
MCV: 95.3 fL (ref 80.0–100.0)
Monocytes Absolute: 1 10*3/uL (ref 0.1–1.0)
Monocytes Relative: 11 %
Neutro Abs: 6.4 10*3/uL (ref 1.7–7.7)
Neutrophils Relative %: 73 %
Platelet Count: 233 10*3/uL (ref 150–400)
RBC: 3.79 MIL/uL — ABNORMAL LOW (ref 4.22–5.81)
RDW: 13.6 % (ref 11.5–15.5)
WBC Count: 8.8 10*3/uL (ref 4.0–10.5)
nRBC: 0 % (ref 0.0–0.2)

## 2018-02-05 LAB — LACTATE DEHYDROGENASE: LDH: 146 U/L (ref 98–192)

## 2018-02-05 MED ORDER — SODIUM CHLORIDE 0.9 % IV SOLN
480.0000 mg | Freq: Once | INTRAVENOUS | Status: AC
  Administered 2018-02-05: 480 mg via INTRAVENOUS
  Filled 2018-02-05: qty 48

## 2018-02-05 MED ORDER — HEPARIN SOD (PORK) LOCK FLUSH 100 UNIT/ML IV SOLN
500.0000 [IU] | Freq: Once | INTRAVENOUS | Status: AC | PRN
  Administered 2018-02-05: 500 [IU]
  Filled 2018-02-05: qty 5

## 2018-02-05 MED ORDER — SODIUM CHLORIDE 0.9 % IV SOLN
Freq: Once | INTRAVENOUS | Status: AC
  Administered 2018-02-05: 10:00:00 via INTRAVENOUS
  Filled 2018-02-05: qty 250

## 2018-02-05 MED ORDER — SODIUM CHLORIDE 0.9% FLUSH
10.0000 mL | INTRAVENOUS | Status: DC | PRN
  Administered 2018-02-05: 10 mL
  Filled 2018-02-05: qty 10

## 2018-02-05 NOTE — Progress Notes (Signed)
Hematology and Oncology Follow Up Visit  [name] [id number] 1945-10-22 72 y.o. 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 He has not had much of an appetite and is down 9 lbs since his last visit. He has not been taking his Marinol and will start back at twice a day.  He is supplementing once a day with Boost.He feels that he is hydrating well.  No fever, chills, n/v, cough, rash, dizziness, chest pain, palpitations or abdominal pain.  His nausea, bowel and bladder habits have improved on the Prednisone taper. He is now down to 20 mg and 10 mg. His baseline dose is 5-10 mg PO daily for RA.  The swelling in his lower extremities is stable. He has no c/o numbness or tingling in his extremities.  No lymphadenopathy noted on exam.  No episodes of bleeding. His skin is thin and he bruises easily.   ECOG Performance Status: 1 - Symptomatic but completely ambulatory  Medications:  Allergies as of 02/05/2018      Reactions   Cefepime Hives, Shortness Of Breath   Cephaeline Hives   Morphine And Related Shortness Of Breath, Nausea And Vomiting, Swelling, Other (See Comments)   Agitation, tolerates dilaudid Other reaction(s): Other (See Comments) Agitation, tolerates dilaudid   Penicillins Hives, Shortness Of Breath, Rash   Has patient had a PCN reaction causing immediate rash, facial/tongue/throat swelling, SOB or lightheadedness with hypotension: Yes Has patient had a PCN reaction causing severe rash involving mucus membranes or skin necrosis: Yes Has patient had a PCN reaction that required  hospitalization No Has patient had a PCN reaction occurring within the last 10 years: No If all of the above answers are "NO", then may proceed with Cephalosporin use.   Morphine    Oxycodone-acetaminophen    Doxycycline Rash      Medication List        Accurate as of 02/05/18 10:35 AM. Always use your most recent med list.          albuterol 108 (90 Base) MCG/ACT inhaler Commonly known as:  PROVENTIL HFA;VENTOLIN HFA Inhale 1-2 puffs into the lungs every 4 (four) hours as needed for wheezing or shortness of breath.   alfuzosin 10 MG 24 hr tablet Commonly known as:  UROXATRAL Take 10 mg by mouth at bedtime.   ALPRAZolam 0.5 MG tablet Commonly known as:  XANAX Take 1 tablet (0.5 mg total) by mouth 2 (two) times daily as needed. for anxiety   CIALIS 5 MG tablet Generic drug:  tadalafil Take 5 mg by mouth as needed.   clopidogrel 75 MG tablet Commonly known as:  PLAVIX TAKE 1 TABLET BY MOUTH EVERY DAY WITH BREAKFAST   COLCRYS 0.6 MG tablet Generic drug:  colchicine Take 1 tablet by mouth every evening.   dronabinol 5 MG capsule Commonly known as:  MARINOL Take 1 capsule (5 mg total) by mouth 2 (two) times daily before a meal.   FISH OIL OMEGA-3 1000 MG Caps Take 1,000 mg by mouth daily.   FLUoxetine 40 MG capsule Commonly known as:  PROZAC TAKE 1 CAPSULE BY MOUTH TWICE DAILY   fluticasone 50  MCG/ACT nasal spray Commonly known as:  FLONASE INSTILL 2 SPRAYS IN EACH NOSTRIL EVERY DAY   gabapentin 300 MG capsule Commonly known as:  NEURONTIN Take 1 capsule (300 mg total) by mouth 2 (two) times daily.   HYDROmorphone 2 MG tablet Commonly known as:  DILAUDID Take 1 tablet (2 mg total) by mouth every 8 (eight) hours as needed for severe pain. Please fill on or after 02/12/18   HYDROmorphone 2 MG tablet Commonly known as:  DILAUDID Take 1 tablet (2 mg total) by mouth every 8 (eight) hours as needed for severe pain. Please fill on or after 02/12/18     lidocaine-prilocaine cream Commonly known as:  EMLA Apply 1 application topically as needed.   linaclotide 145 MCG Caps capsule Commonly known as:  LINZESS Take 1 capsule (145 mcg total) by mouth daily before breakfast.   LORazepam 0.5 MG tablet Commonly known as:  ATIVAN TAKE 1 TABLET(0.5 MG) BY MOUTH EVERY 8 HOURS   losartan-hydrochlorothiazide 50-12.5 MG tablet Commonly known as:  HYZAAR TAKE 1 TABLET BY MOUTH DAILY   Melatonin 5 MG Tabs Take 5 mg by mouth at bedtime. Takes 10 mg at HS   metoprolol tartrate 25 MG tablet Commonly known as:  LOPRESSOR Take 1 tablet (25 mg total) by mouth 2 (two) times daily.   ondansetron 8 MG tablet Commonly known as:  ZOFRAN Take 1 tablet (8 mg total) by mouth 3 (three) times daily.   pantoprazole 40 MG tablet Commonly known as:  PROTONIX Take 1 tablet (40 mg total) by mouth every evening.   potassium chloride SA 20 MEQ tablet Commonly known as:  K-DUR,KLOR-CON DISSOLVE 4 TABLETS IN 4 TO 8 OUNCES OF WATER AND DRINK BY MOUTH ONCE ON DAY 1 THEN DISSOLVE 2 TABLETS AND DRINK ONCE DAILY FOR 10 DAYS   pravastatin 20 MG tablet Commonly known as:  PRAVACHOL Take 1 tablet (20 mg total) by mouth daily.   predniSONE 5 MG tablet Commonly known as:  DELTASONE Take 5 mg by mouth daily with breakfast.   predniSONE 20 MG tablet Commonly known as:  DELTASONE Take 1 tablet (20 mg total) by mouth daily with breakfast.   prochlorperazine 10 MG tablet Commonly known as:  COMPAZINE TAKE 1 TABLET(10 MG) BY MOUTH EVERY 6 HOURS AS NEEDED FOR NAUSEA OR VOMITING   traZODone 100 MG tablet Commonly known as:  DESYREL TAKE 1 TABLET(100 MG) BY MOUTH AT BEDTIME AS NEEDED FOR SLEEP   vitamin B-12 1000 MCG tablet Commonly known as:  CYANOCOBALAMIN Take 1,000 mcg by mouth daily.   Vitamin D3 2000 units capsule Take 1 capsule (2,000 Units total) by mouth daily.       Allergies:  Allergies  Allergen Reactions  . Cefepime Hives and Shortness Of  Breath  . Cephaeline Hives  . Morphine And Related Shortness Of Breath, Nausea And Vomiting, Swelling and Other (See Comments)    Agitation, tolerates dilaudid Other reaction(s): Other (See Comments) Agitation, tolerates dilaudid  . Penicillins Hives, Shortness Of Breath and Rash    Has patient had a PCN reaction causing immediate rash, facial/tongue/throat swelling, SOB or lightheadedness with hypotension: Yes Has patient had a PCN reaction causing severe rash involving mucus membranes or skin necrosis: Yes Has patient had a PCN reaction that required hospitalization No Has patient had a PCN reaction occurring within the last 10 years: No If all of the above answers are "NO", then may proceed with Cephalosporin use.   [name] Morphine   .  Oxycodone-Acetaminophen   . Doxycycline Rash    Past Medical History, Surgical history, Social history, and Family History were reviewed and updated.  Review of Systems: All other 10 point review of systems is negative.   Physical Exam:  vitals were not taken for this visit.   Wt Readings from Last 3 Encounters:  01/08/18 144 lb 12 oz (65.7 kg)  12/23/17 140 lb (63.5 kg)  12/11/17 140 lb (63.5 kg)    Ocular: Sclerae unicteric, pupils equal, round and reactive to light Ear-nose-throat: Oropharynx clear, dentition fair Lymphatic: No cervical, supraclavicular or axillary adenopathy Lungs no rales or rhonchi, good excursion bilaterally Heart regular rate and rhythm, no murmur appreciated Abd soft, nontender, positive bowel sounds, no liver or spleen tip palpated on exam, no fluid wave  MSK no focal spinal tenderness, no joint edema Neuro: non-focal, well-oriented, appropriate affect Breasts: Deferred   Lab Results  Component Value Date   WBC 8.8 02/05/2018   HGB 11.8 (L) 02/05/2018   HCT 36.1 (L) 02/05/2018   MCV 95.3 02/05/2018   PLT 233 02/05/2018   Lab Results  Component Value Date   FERRITIN 97 01/08/2018   IRON 124 01/08/2018   TIBC  166 (L) 01/08/2018   UIBC 42 01/08/2018   IRONPCTSAT 75 01/08/2018   Lab Results  Component Value Date   RETICCTPCT 1.9 03/24/2013   RBC 3.79 (L) 02/05/2018   No results found for: KPAFRELGTCHN, LAMBDASER, KAPLAMBRATIO No results found for: Kandis Cocking, IGMSERUM No results found for: Odetta Pink, SPEI   Chemistry      Component Value Date/Time   NA 136 02/05/2018 0921   NA 140 04/10/2017 1046   NA 135 (L) 03/06/2017 1104   K 3.5 02/05/2018 0921   K 3.7 04/10/2017 1046   K 4.2 03/06/2017 1104   CL 102 02/05/2018 0921   CL 98 04/10/2017 1046   CO2 30 02/05/2018 0921   CO2 31 04/10/2017 1046   CO2 30 (H) 03/06/2017 1104   BUN 22 02/05/2018 0921   BUN 21 04/10/2017 1046   BUN 20.0 03/06/2017 1104   CREATININE 0.80 02/05/2018 0921   CREATININE 0.9 04/10/2017 1046   CREATININE 1.0 03/06/2017 1104      Component Value Date/Time   CALCIUM 8.8 02/05/2018 0921   CALCIUM 8.9 04/10/2017 1046   CALCIUM 9.4 03/06/2017 1104   ALKPHOS 61 02/05/2018 0921   ALKPHOS 65 04/10/2017 1046   ALKPHOS 78 03/06/2017 1104   AST 18 02/05/2018 0921   AST 20 03/06/2017 1104   ALT 19 02/05/2018 0921   ALT 21 04/10/2017 1046   ALT 20 03/06/2017 1104   BILITOT 0.7 02/05/2018 0921   BILITOT 0.47 03/06/2017 1104       Impression and Plan: Mr. Pembleton is a very pleasant 72 yo caucasian gentleman with history of hemochromatosis, homozygous for the C282Y mutation which has not been a problem for him.  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  He is doing well on Opdivo and has responded nicely to the steroid taper.  His issue at this time is weight loss. He will restart the Marinol BID and we will recheck weight at his next visit. He will also supplement with Boost daily.  We will proceed with treatment today as planned.  We will see him back in another 4 weeks.  He will contact our office with any questions  or concerns. We can certainly see  him sooner if need be.   Laverna Peace, NP 10/31/201910:35 AM

## 2018-02-05 NOTE — Patient Instructions (Signed)
Nivolumab injection What is this medicine? NIVOLUMAB (nye VOL ue mab) is a monoclonal antibody. It is used to treat melanoma, lung cancer, kidney cancer, head and neck cancer, Hodgkin lymphoma, urothelial cancer, colon cancer, and liver cancer. This medicine may be used for other purposes; ask your health care provider or pharmacist if you have questions. COMMON BRAND NAME(S): Opdivo What should I tell my health care provider before I take this medicine? They need to know if you have any of these conditions: -diabetes -immune system problems -kidney disease -liver disease -lung disease -organ transplant -stomach or intestine problems -thyroid disease -an unusual or allergic reaction to nivolumab, other medicines, foods, dyes, or preservatives -pregnant or trying to get pregnant -breast-feeding How should I use this medicine? This medicine is for infusion into a vein. It is given by a health care professional in a hospital or clinic setting. A special MedGuide will be given to you before each treatment. Be sure to read this information carefully each time. Talk to your pediatrician regarding the use of this medicine in children. While this drug may be prescribed for children as young as 12 years for selected conditions, precautions do apply. Overdosage: If you think you have taken too much of this medicine contact a poison control center or emergency room at once. NOTE: This medicine is only for you. Do not share this medicine with others. What if I miss a dose? It is important not to miss your dose. Call your doctor or health care professional if you are unable to keep an appointment. What may interact with this medicine? Interactions have not been studied. Give your health care provider a list of all the medicines, herbs, non-prescription drugs, or dietary supplements you use. Also tell them if you smoke, drink alcohol, or use illegal drugs. Some items may interact with your  medicine. This list may not describe all possible interactions. Give your health care provider a list of all the medicines, herbs, non-prescription drugs, or dietary supplements you use. Also tell them if you smoke, drink alcohol, or use illegal drugs. Some items may interact with your medicine. What should I watch for while using this medicine? This drug may make you feel generally unwell. Continue your course of treatment even though you feel ill unless your doctor tells you to stop. You may need blood work done while you are taking this medicine. Do not become pregnant while taking this medicine or for 5 months after stopping it. Women should inform their doctor if they wish to become pregnant or think they might be pregnant. There is a potential for serious side effects to an unborn child. Talk to your health care professional or pharmacist for more information. Do not breast-feed an infant while taking this medicine. What side effects may I notice from receiving this medicine? Side effects that you should report to your doctor or health care professional as soon as possible: -allergic reactions like skin rash, itching or hives, swelling of the face, lips, or tongue -black, tarry stools -blood in the urine -bloody or watery diarrhea -changes in vision -change in sex drive -changes in emotions or moods -chest pain -confusion -cough -decreased appetite -diarrhea -facial flushing -feeling faint or lightheaded -fever, chills -hair loss -hallucination, loss of contact with reality -headache -irritable -joint pain -loss of memory -muscle pain -muscle weakness -seizures -shortness of breath -signs and symptoms of high blood sugar such as dizziness; dry mouth; dry skin; fruity breath; nausea; stomach pain; increased hunger or thirst; increased  urination -signs and symptoms of kidney injury like trouble passing urine or change in the amount of urine -signs and symptoms of liver injury  like dark yellow or brown urine; general ill feeling or flu-like symptoms; light-colored stools; loss of appetite; nausea; right upper belly pain; unusually weak or tired; yellowing of the eyes or skin -stiff neck -swelling of the ankles, feet, hands -weight gain Side effects that usually do not require medical attention (report to your doctor or health care professional if they continue or are bothersome): -bone pain -constipation -tiredness -vomiting This list may not describe all possible side effects. Call your doctor for medical advice about side effects. You may report side effects to FDA at 1-800-FDA-1088. Where should I keep my medicine? This drug is given in a hospital or clinic and will not be stored at home. NOTE: This sheet is a summary. It may not cover all possible information. If you have questions about this medicine, talk to your doctor, pharmacist, or health care provider.  2018 Elsevier/Gold Standard (2016-01-01 17:49:34)

## 2018-02-12 ENCOUNTER — Telehealth: Payer: Self-pay | Admitting: *Deleted

## 2018-02-12 ENCOUNTER — Other Ambulatory Visit: Payer: Self-pay | Admitting: *Deleted

## 2018-02-12 DIAGNOSIS — C4372 Malignant melanoma of left lower limb, including hip: Secondary | ICD-10-CM

## 2018-02-12 MED ORDER — PREDNISONE 20 MG PO TABS: 20.0000 mg | ORAL_TABLET | Freq: Every day | ORAL | 0 refills | Status: DC

## 2018-02-12 NOTE — Telephone Encounter (Signed)
Patient has been titrating his dose of prednisone down. He is currently on 42m daily. His loose stools have restarted without improvement with antidiarrheals.   Reviewed symptoms with Dr EMarin Olp He would like patient to increase his prednisone to 22mdaily until he is seen again in the office.    Patient is aware of recommendation. New prescription sent. Pharmacy confirmed.

## 2018-02-13 ENCOUNTER — Other Ambulatory Visit: Payer: Self-pay | Admitting: Internal Medicine

## 2018-02-13 ENCOUNTER — Other Ambulatory Visit: Payer: Self-pay

## 2018-02-13 MED ORDER — GABAPENTIN 300 MG PO CAPS: 300.0000 mg | ORAL_CAPSULE | Freq: Two times a day (BID) | ORAL | 1 refills | Status: DC

## 2018-02-13 MED ORDER — METOPROLOL TARTRATE 25 MG PO TABS: 25.0000 mg | ORAL_TABLET | Freq: Two times a day (BID) | ORAL | 1 refills | Status: DC

## 2018-02-26 ENCOUNTER — Other Ambulatory Visit: Payer: Self-pay | Admitting: Internal Medicine

## 2018-02-26 DIAGNOSIS — C4372 Malignant melanoma of left lower limb, including hip: Secondary | ICD-10-CM

## 2018-02-26 MED ORDER — PANTOPRAZOLE SODIUM 40 MG PO TBEC: 40.0000 mg | DELAYED_RELEASE_TABLET | Freq: Every evening | ORAL | 1 refills | Status: DC

## 2018-02-26 MED ORDER — FLUOXETINE HCL 40 MG PO CAPS: 40.0000 mg | ORAL_CAPSULE | Freq: Two times a day (BID) | ORAL | 0 refills | Status: DC

## 2018-02-26 MED ORDER — TRAZODONE HCL 100 MG PO TABS: ORAL_TABLET | ORAL | 2 refills | Status: DC

## 2018-02-26 NOTE — Telephone Encounter (Signed)
Copied from [place] [id number]. Topic: Quick Communication - Rx Refill/Question >> Feb 26, 2018  8:54 AM Reyne Dumas L wrote: Medication:  clopidogrel (PLAVIX) 75 MG tablet FLUoxetine (PROZAC) 40 MG capsule gabapentin (NEURONTIN) 300 MG capsule losartan-hydrochlorothiazide (HYZAAR) 50-12.5 MG tablet metoprolol tartrate (LOPRESSOR) 25 MG tablet pantoprazole (PROTONIX) 40 MG tablet traZODone (DESYREL) 100 MG tablet predniSONE (DELTASONE) 5 MG tablet  Kristy from [facility] calling.  States they faxed request over on 11/13, but have gotten no response.  Direct call back number is [phone].  Preferred Pharmacy (with phone number or street name):  [facility] [place] [phone] (Phone) [phone] (Fax)  Agent: Please be advised that RX refills may take up to 3 business days. We ask that you follow-up with your pharmacy.

## 2018-02-27 ENCOUNTER — Other Ambulatory Visit: Payer: Self-pay

## 2018-02-27 MED ORDER — METOPROLOL TARTRATE 25 MG PO TABS: 25.0000 mg | ORAL_TABLET | Freq: Two times a day (BID) | ORAL | 1 refills | Status: DC

## 2018-02-27 MED ORDER — PREDNISONE 20 MG PO TABS: 20.0000 mg | ORAL_TABLET | Freq: Every day | ORAL | 0 refills | Status: DC

## 2018-02-27 MED ORDER — PREDNISONE 5 MG PO TABS: 5.0000 mg | ORAL_TABLET | Freq: Every day | ORAL | 2 refills | Status: DC

## 2018-02-27 MED ORDER — LOSARTAN POTASSIUM-HCTZ 50-12.5 MG PO TABS: 1.0000 | ORAL_TABLET | Freq: Every day | ORAL | 3 refills | Status: DC

## 2018-03-02 ENCOUNTER — Telehealth: Payer: Self-pay | Admitting: Internal Medicine

## 2018-03-02 NOTE — Telephone Encounter (Signed)
Copied from [place] [phone]. Topic: Quick Communication - Rx Refill/Question >> Mar 02, 2018  1:37 PM Blase Mess A wrote: Medication: HYDROmorphone (DILAUDID) 2 MG tablet [[id number]]   Has the patient contacted their pharmacy? Yes  (Agent: If no, request that the patient contact the pharmacy for the refill.) (Agent: If yes, when and what did the pharmacy advise?)  Preferred Pharmacy (with phone number or street name): [facility] [place], [place] [address] Phone: [phone] Fax: [phone]    Agent: Please be advised that RX refills may take up to 3 business days. We ask that you follow-up with your pharmacy.

## 2018-03-03 ENCOUNTER — Other Ambulatory Visit: Payer: Self-pay | Admitting: *Deleted

## 2018-03-03 ENCOUNTER — Other Ambulatory Visit: Payer: Self-pay | Admitting: Family

## 2018-03-03 MED ORDER — HYDROMORPHONE HCL 2 MG PO TABS: 2.0000 mg | ORAL_TABLET | Freq: Three times a day (TID) | ORAL | 0 refills | Status: DC | PRN

## 2018-03-03 MED ORDER — PREDNISONE 5 MG PO TABS: 5.0000 mg | ORAL_TABLET | Freq: Every day | ORAL | 2 refills | Status: DC

## 2018-03-03 NOTE — Telephone Encounter (Signed)
Ok OV q 3 mo Thx

## 2018-03-03 NOTE — Telephone Encounter (Signed)
MD printed rx pt want rx to [facility]..controlls must be sent electronically by provider. Pls resend electronically.Marland KitchenJohny Chess

## 2018-03-03 NOTE — Telephone Encounter (Signed)
Ok Thx 

## 2018-03-04 ENCOUNTER — Inpatient Hospital Stay: Payer: Medicare Other

## 2018-03-04 ENCOUNTER — Other Ambulatory Visit: Payer: Self-pay | Admitting: *Deleted

## 2018-03-04 ENCOUNTER — Encounter: Payer: Self-pay | Admitting: Hematology & Oncology

## 2018-03-04 ENCOUNTER — Other Ambulatory Visit: Payer: Self-pay

## 2018-03-04 ENCOUNTER — Inpatient Hospital Stay: Payer: Medicare Other | Attending: Hematology & Oncology | Admitting: Hematology & Oncology

## 2018-03-04 VITALS — BP 119/73 | HR 81 | Temp 98.3°F | Resp 17 | Ht 66.0 in | Wt 136.0 lb

## 2018-03-04 DIAGNOSIS — C4372 Malignant melanoma of left lower limb, including hip: Secondary | ICD-10-CM

## 2018-03-04 DIAGNOSIS — R197 Diarrhea, unspecified: Secondary | ICD-10-CM | POA: Diagnosis not present

## 2018-03-04 DIAGNOSIS — D500 Iron deficiency anemia secondary to blood loss (chronic): Secondary | ICD-10-CM

## 2018-03-04 DIAGNOSIS — Z79899 Other long term (current) drug therapy: Secondary | ICD-10-CM | POA: Diagnosis not present

## 2018-03-04 DIAGNOSIS — E876 Hypokalemia: Secondary | ICD-10-CM | POA: Diagnosis not present

## 2018-03-04 DIAGNOSIS — Z95828 Presence of other vascular implants and grafts: Secondary | ICD-10-CM

## 2018-03-04 LAB — CMP (CANCER CENTER ONLY)
ALT: 11 U/L (ref 0–44)
AST: 9 U/L — ABNORMAL LOW (ref 15–41)
Albumin: 3.4 g/dL — ABNORMAL LOW (ref 3.5–5.0)
Alkaline Phosphatase: 49 U/L (ref 38–126)
Anion gap: 9 (ref 5–15)
BUN: 13 mg/dL (ref 8–23)
CO2: 29 mmol/L (ref 22–32)
Calcium: 7.8 mg/dL — ABNORMAL LOW (ref 8.9–10.3)
Chloride: 102 mmol/L (ref 98–111)
Creatinine: 0.82 mg/dL (ref 0.61–1.24)
GFR, Est AFR Am: >60 mL/min (ref >60)
GFR, Estimated: >60 mL/min (ref >60)
Glucose, Bld: 93 mg/dL (ref 70–99)
Potassium: 2.5 mmol/L — CL (ref 3.5–5.1)
Sodium: 140 mmol/L (ref 135–145)
Total Bilirubin: 0.6 mg/dL (ref 0.3–1.2)
Total Protein: 5.4 g/dL — ABNORMAL LOW (ref 6.5–8.1)

## 2018-03-04 LAB — CBC WITH DIFFERENTIAL (CANCER CENTER ONLY)
Abs Immature Granulocytes: 0.09 10*3/uL — ABNORMAL HIGH (ref 0.00–0.07)
Basophils Absolute: 0 10*3/uL (ref 0.0–0.1)
Basophils Relative: 0 %
Eosinophils Absolute: 0 10*3/uL (ref 0.0–0.5)
Eosinophils Relative: 0 %
HCT: 35.8 % — ABNORMAL LOW (ref 39.0–52.0)
Hemoglobin: 11.9 g/dL — ABNORMAL LOW (ref 13.0–17.0)
Immature Granulocytes: 1 %
Lymphocytes Relative: 17 %
Lymphs Abs: 1.6 10*3/uL (ref 0.7–4.0)
MCH: 32.2 pg (ref 26.0–34.0)
MCHC: 33.2 g/dL (ref 30.0–36.0)
MCV: 96.8 fL (ref 80.0–100.0)
Monocytes Absolute: 0.8 10*3/uL (ref 0.1–1.0)
Monocytes Relative: 8 %
Neutro Abs: 7.1 10*3/uL (ref 1.7–7.7)
Neutrophils Relative %: 74 %
Platelet Count: 193 10*3/uL (ref 150–400)
RBC: 3.7 MIL/uL — ABNORMAL LOW (ref 4.22–5.81)
RDW: 13.4 % (ref 11.5–15.5)
WBC Count: 9.6 10*3/uL (ref 4.0–10.5)
nRBC: 0 % (ref 0.0–0.2)

## 2018-03-04 LAB — LACTATE DEHYDROGENASE: LDH: 161 U/L (ref 98–192)

## 2018-03-04 MED ORDER — ALTEPLASE 2 MG IJ SOLR
2.0000 mg | Freq: Once | INTRAMUSCULAR | Status: DC | PRN
  Filled 2018-03-04: qty 2

## 2018-03-04 MED ORDER — POTASSIUM CHLORIDE CRYS ER 20 MEQ PO TBCR
40.0000 meq | EXTENDED_RELEASE_TABLET | ORAL | Status: AC
  Administered 2018-03-04 (×2): 40 meq via ORAL
  Filled 2018-03-04: qty 2

## 2018-03-04 MED ORDER — HEPARIN SOD (PORK) LOCK FLUSH 100 UNIT/ML IV SOLN
500.0000 [IU] | Freq: Once | INTRAVENOUS | Status: DC
  Filled 2018-03-04: qty 5

## 2018-03-04 MED ORDER — SODIUM CHLORIDE 0.9% FLUSH
10.0000 mL | INTRAVENOUS | Status: DC | PRN
  Administered 2018-03-04: 10 mL via INTRAVENOUS
  Filled 2018-03-04: qty 10

## 2018-03-04 MED ORDER — SODIUM CHLORIDE 0.9 % IV SOLN
INTRAVENOUS | Status: AC
  Administered 2018-03-04: 11:00:00 via INTRAVENOUS
  Filled 2018-03-04 (×2): qty 250

## 2018-03-04 MED ORDER — SODIUM CHLORIDE 0.9% FLUSH
10.0000 mL | INTRAVENOUS | Status: DC | PRN
  Filled 2018-03-04: qty 10

## 2018-03-04 MED ORDER — POTASSIUM CHLORIDE CRYS ER 20 MEQ PO TBCR: 40.0000 meq | EXTENDED_RELEASE_TABLET | Freq: Two times a day (BID) | ORAL | 2 refills | Status: DC

## 2018-03-04 MED ORDER — HEPARIN SOD (PORK) LOCK FLUSH 100 UNIT/ML IV SOLN
500.0000 [IU] | Freq: Once | INTRAVENOUS | Status: AC
  Administered 2018-03-04: 500 [IU] via INTRAVENOUS
  Filled 2018-03-04: qty 5

## 2018-03-04 NOTE — Progress Notes (Signed)
Hematology and Oncology Follow Up Visit  [name] [id number] 1945/12/07 72 y.o. 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 is here today for follow-up and treatment.  His problem continues to be diarrhea.  He is getting diarrhea from the nivolumab.  However, he told me that he is still taking the colchicine.  He is not having problems with gout.  I told him that he has to stop the colchicine.  He is on prednisone.  I we will increase his prednisone up to 80 mg a day.  I wrote him out a tapering schedule to see if this helps with the diarrhea.  I am going to stop the nivolumab.  He has had 11 months of the nivolumab.  I just cannot see him continuing to take the nivolumab.  He is supposed to stop in December.  We will set him up with a follow-up PET scan as our baseline so that we can follow down the road if he starts to have any issues.  If the prednisone does not help the diarrhea, then we will consider him for Remicade.  I think this would be a very reasonable approach to his diarrhea.  He is eating well.  He does not need Marinol.  He has had no problems with cough or shortness of breath.  He has had no mouth sores.  He does have enlarged tonsils.  They do not bother him.  He has had no fevers.  There is been no bleeding.  He also has hemochromatosis.  We are watching this.  His iron studies that were done 3 weeks ago showed a ferritin of 123 with an iron saturation of 54%.  His potassium is quite low today.  We will give him some potassium with his IV fluid.  I sent a prescription in for oral potassium.  He is had no  headache.  Overall, his performance status is ECOG 2.  I must say that he is on quite a few medications.  At some point, we are going to have to have him bring his medications in so we can go over everything and figure out what he does not need.   Medications:  Allergies as of 03/04/2018      Reactions   Cefepime Hives, Shortness Of Breath   Cephaeline Hives   Morphine And Related Shortness Of Breath, Nausea And Vomiting, Swelling, Other (See Comments)   Agitation, tolerates dilaudid Other reaction(s): Other (See Comments) Agitation, tolerates dilaudid   Penicillins Hives, Shortness Of Breath, Rash   Has patient had a PCN reaction causing immediate rash, facial/tongue/throat swelling, SOB or lightheadedness with hypotension: Yes Has patient had a PCN reaction causing severe rash involving mucus membranes or skin necrosis: Yes Has patient had a PCN reaction that required hospitalization No Has patient had a PCN reaction occurring within the last 10 years: No If all of the above answers are "NO", then may proceed with Cephalosporin use.   Morphine    Oxycodone-acetaminophen    Doxycycline Rash      Medication List        Accurate as of 03/04/18  9:59 AM. Always use your most recent med list.  albuterol 108 (90 Base) MCG/ACT inhaler Commonly known as:  PROVENTIL HFA;VENTOLIN HFA Inhale 1-2 puffs into the lungs every 4 (four) hours as needed for wheezing or shortness of breath.   alfuzosin 10 MG 24 hr tablet Commonly known as:  UROXATRAL Take 10 mg by mouth at bedtime.   ALPRAZolam 0.5 MG tablet Commonly known as:  XANAX Take 1 tablet (0.5 mg total) by mouth 2 (two) times daily as needed. for anxiety   CIALIS 5 MG tablet Generic drug:  tadalafil Take 5 mg by mouth as needed.   clopidogrel 75 MG tablet Commonly known as:  PLAVIX TAKE 1 TABLET BY MOUTH EVERY DAY WITH BREAKFAST   COLCRYS 0.6 MG tablet Generic drug:  colchicine Take 1 tablet by mouth every  evening.   dronabinol 5 MG capsule Commonly known as:  MARINOL Take 1 capsule (5 mg total) by mouth 2 (two) times daily before a meal.   FISH OIL OMEGA-3 1000 MG Caps Take 1,000 mg by mouth daily.   FLUoxetine 40 MG capsule Commonly known as:  PROZAC Take 1 capsule (40 mg total) by mouth 2 (two) times daily.   fluticasone 50 MCG/ACT nasal spray Commonly known as:  FLONASE INSTILL 2 SPRAYS IN EACH NOSTRIL EVERY DAY   gabapentin 300 MG capsule Commonly known as:  NEURONTIN Take 1 capsule (300 mg total) by mouth 2 (two) times daily.   HYDROmorphone 2 MG tablet Commonly known as:  DILAUDID Take 1 tablet (2 mg total) by mouth every 8 (eight) hours as needed for severe pain. Please fill on or after 03/14/18   lidocaine-prilocaine cream Commonly known as:  EMLA Apply 1 application topically as needed.   linaclotide 145 MCG Caps capsule Commonly known as:  LINZESS Take 1 capsule (145 mcg total) by mouth daily before breakfast.   losartan-hydrochlorothiazide 50-12.5 MG tablet Commonly known as:  HYZAAR Take 1 tablet by mouth daily.   Melatonin 5 MG Tabs Take 5 mg by mouth at bedtime. Takes 10 mg at HS   metoprolol tartrate 25 MG tablet Commonly known as:  LOPRESSOR Take 1 tablet (25 mg total) by mouth 2 (two) times daily.   ondansetron 8 MG tablet Commonly known as:  ZOFRAN Take 1 tablet (8 mg total) by mouth 3 (three) times daily.   pantoprazole 40 MG tablet Commonly known as:  PROTONIX Take 1 tablet (40 mg total) by mouth every evening.   potassium chloride SA 20 MEQ tablet Commonly known as:  K-DUR,KLOR-CON DISSOLVE 4 TABLETS IN 4 TO 8 OUNCES OF WATER AND DRINK BY MOUTH ONCE ON DAY 1 THEN DISSOLVE 2 TABLETS AND DRINK ONCE DAILY FOR 10 DAYS   pravastatin 20 MG tablet Commonly known as:  PRAVACHOL Take 1 tablet (20 mg total) by mouth daily.   predniSONE 5 MG tablet Commonly known as:  DELTASONE Take 1 tablet (5 mg total) by mouth daily with breakfast.    prochlorperazine 10 MG tablet Commonly known as:  COMPAZINE TAKE 1 TABLET(10 MG) BY MOUTH EVERY 6 HOURS AS NEEDED FOR NAUSEA OR VOMITING   traZODone 100 MG tablet Commonly known as:  DESYREL TAKE 1 TABLET(100 MG) BY MOUTH AT BEDTIME AS NEEDED FOR SLEEP   vitamin B-12 1000 MCG tablet Commonly known as:  CYANOCOBALAMIN Take 1,000 mcg by mouth daily.   Vitamin D3 50 MCG (2000 UT) capsule Take 1 capsule (2,000 Units total) by mouth daily.       Allergies:  Allergies  Allergen Reactions  . Cefepime Hives  and Shortness Of Breath  . Cephaeline Hives  . Morphine And Related Shortness Of Breath, Nausea And Vomiting, Swelling and Other (See Comments)    Agitation, tolerates dilaudid Other reaction(s): Other (See Comments) Agitation, tolerates dilaudid  . Penicillins Hives, Shortness Of Breath and Rash    Has patient had a PCN reaction causing immediate rash, facial/tongue/throat swelling, SOB or lightheadedness with hypotension: Yes Has patient had a PCN reaction causing severe rash involving mucus membranes or skin necrosis: Yes Has patient had a PCN reaction that required hospitalization No Has patient had a PCN reaction occurring within the last 10 years: No If all of the above answers are "NO", then may proceed with Cephalosporin use.   [name] Morphine   . Oxycodone-Acetaminophen   . Doxycycline Rash    Past Medical History, Surgical history, Social history, and Family History were reviewed and updated.  Review of Systems: Review of Systems  Constitutional: Positive for malaise/fatigue.  HENT: Negative.   Eyes: Negative.   Respiratory: Negative.   Cardiovascular: Negative.   Gastrointestinal: Positive for diarrhea.  Genitourinary: Negative.   Musculoskeletal: Positive for joint pain and neck pain.  Skin: Negative.   Neurological: Negative.   Endo/Heme/Allergies: Negative.       Physical Exam:  height is 5' 6"  (1.676 m) and weight is 136 lb (61.7 kg). His oral  temperature is 98.3 F (36.8 C). His blood pressure is 119/73 and his pulse is 81. His respiration is 17 and oxygen saturation is 98%.   Wt Readings from Last 3 Encounters:  03/04/18 136 lb (61.7 kg)  01/08/18 144 lb 12 oz (65.7 kg)  12/23/17 140 lb (63.5 kg)    Physical Exam  Constitutional: He is oriented to person, place, and time.  HENT:  Head: Normocephalic and atraumatic.  Mouth/Throat: Oropharynx is clear and moist.  Eyes: Pupils are equal, round, and reactive to light. EOM are normal.  Neck: Normal range of motion.  Cardiovascular: Normal rate, regular rhythm and normal heart sounds.  Pulmonary/Chest: Effort normal and breath sounds normal.  Abdominal: Soft. Bowel sounds are normal.  Musculoskeletal: Normal range of motion. He exhibits no edema, tenderness or deformity.  Lymphadenopathy:    He has no cervical adenopathy.  Neurological: He is alert and oriented to person, place, and time.  Skin: Skin is warm and dry. No rash noted. No erythema.  Psychiatric: He has a normal mood and affect. His behavior is normal. Judgment and thought content normal.  Vitals reviewed.    Lab Results  Component Value Date   WBC 9.6 03/04/2018   HGB 11.9 (L) 03/04/2018   HCT 35.8 (L) 03/04/2018   MCV 96.8 03/04/2018   PLT 193 03/04/2018   Lab Results  Component Value Date   FERRITIN 123 02/05/2018   IRON 95 02/05/2018   TIBC 176 (L) 02/05/2018   UIBC 81 (L) 02/05/2018   IRONPCTSAT 54 02/05/2018   Lab Results  Component Value Date   RETICCTPCT 1.9 03/24/2013   RBC 3.70 (L) 03/04/2018   No results found for: KPAFRELGTCHN, LAMBDASER, KAPLAMBRATIO No results found for: IGGSERUM, IGA, IGMSERUM No results found for: Odetta Pink, SPEI   Chemistry      Component Value Date/Time   NA 136 02/05/2018 0921   NA 140 04/10/2017 1046   NA 135 (L) 03/06/2017 1104   K 3.5 02/05/2018 0921   K 3.7 04/10/2017 1046   K 4.2 03/06/2017  1104   CL 102 02/05/2018 0921  CL 98 04/10/2017 1046   CO2 30 02/05/2018 0921   CO2 31 04/10/2017 1046   CO2 30 (H) 03/06/2017 1104   BUN 22 02/05/2018 0921   BUN 21 04/10/2017 1046   BUN 20.0 03/06/2017 1104   CREATININE 0.80 02/05/2018 0921   CREATININE 0.9 04/10/2017 1046   CREATININE 1.0 03/06/2017 1104      Component Value Date/Time   CALCIUM 8.8 02/05/2018 0921   CALCIUM 8.9 04/10/2017 1046   CALCIUM 9.4 03/06/2017 1104   ALKPHOS 61 02/05/2018 0921   ALKPHOS 65 04/10/2017 1046   ALKPHOS 78 03/06/2017 1104   AST 18 02/05/2018 0921   AST 20 03/06/2017 1104   ALT 19 02/05/2018 0921   ALT 21 04/10/2017 1046   ALT 20 03/06/2017 1104   BILITOT 0.7 02/05/2018 0921   BILITOT 0.47 03/06/2017 1104       Impression and Plan: Mr. Mcphatter is a very pleasant 72 yo caucasian gentleman with history of hemochromatosis, homozygous for the C282Y mutation which has not been a problem for him.   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  Again, we are going to stop the nivolumab now.  I just cannot see him suffering like this with the diarrhea.  Again, the steroid taper should help.  We have given him IV fluids and oral potassium.  He knows about the potassium to take at home.  I will set him up with a PET scan.  I will do the PET scan in December.  I would expect the PET scan to be negative for any residual melanoma.  I will plan to see him back in 2 weeks just to make sure that this diarrhea is under control.  I told him to call us in 1 week to let me know how the diarrhea is doing.  He understands well and will do this.    [name], MD 11/27/20199:59 AM

## 2018-03-04 NOTE — Patient Instructions (Signed)
Dehydration, Adult Dehydration is a condition in which there is not enough fluid or water in the body. This happens when you lose more fluids than you take in. Important organs, such as the kidneys, brain, and heart, cannot function without a proper amount of fluids. Any loss of fluids from the body can lead to dehydration. Dehydration can range from mild to severe. This condition should be treated right away to prevent it from becoming severe. What are the causes? This condition may be caused by:  Vomiting.  Diarrhea.  Excessive sweating, such as from heat exposure or exercise.  Not drinking enough fluid, especially: ? When ill. ? While doing activity that requires a lot of energy.  Excessive urination.  Fever.  Infection.  Certain medicines, such as medicines that cause the body to lose excess fluid (diuretics).  Inability to access safe drinking water.  Reduced physical ability to get adequate water and food.  What increases the risk? This condition is more likely to develop in people:  Who have a poorly controlled long-term (chronic) illness, such as diabetes, heart disease, or kidney disease.  Who are age 68 or older.  Who are disabled.  Who live in a place with high altitude.  Who play endurance sports.  What are the signs or symptoms? Symptoms of mild dehydration may include:  Thirst.  Dry lips.  Slightly dry mouth.  Dry, warm skin.  Dizziness. Symptoms of moderate dehydration may include:  Very dry mouth.  Muscle cramps.  Dark urine. Urine may be the color of tea.  Decreased urine production.  Decreased tear production.  Heartbeat that is irregular or faster than normal (palpitations).  Headache.  Light-headedness, especially when you stand up from a sitting position.  Fainting (syncope). Symptoms of severe dehydration may include:  Changes in skin, such as: ? Cold and clammy skin. ? Blotchy (mottled) or pale skin. ? Skin that does  not quickly return to normal after being lightly pinched and released (poor skin turgor).  Changes in body fluids, such as: ? Extreme thirst. ? No tear production. ? Inability to sweat when body temperature is high, such as in hot weather. ? Very little urine production.  Changes in vital signs, such as: ? Weak pulse. ? Pulse that is more than 100 beats a minute when sitting still. ? Rapid breathing. ? Low blood pressure.  Other changes, such as: ? Sunken eyes. ? Cold hands and feet. ? Confusion. ? Lack of energy (lethargy). ? Difficulty waking up from sleep. ? Short-term weight loss. ? Unconsciousness. How is this diagnosed? This condition is diagnosed based on your symptoms and a physical exam. Blood and urine tests may be done to help confirm the diagnosis. How is this treated? Treatment for this condition depends on the severity. Mild or moderate dehydration can often be treated at home. Treatment should be started right away. Do not wait until dehydration becomes severe. Severe dehydration is an emergency and it needs to be treated in a hospital. Treatment for mild dehydration may include:  Drinking more fluids.  Replacing salts and minerals in your blood (electrolytes) that you may have lost. Treatment for moderate dehydration may include:  Drinking an oral rehydration solution (ORS). This is a drink that helps you replace fluids and electrolytes (rehydrate). It can be found at pharmacies and retail stores. Treatment for severe dehydration may include:  Receiving fluids through an IV tube.  Receiving an electrolyte solution through a feeding tube that is passed through your nose  and into your stomach (nasogastric tube, or NG tube).  Correcting any abnormalities in electrolytes.  Treating the underlying cause of dehydration. Follow these instructions at home:  If directed by your health care provider, drink an ORS: ? Make an ORS by following instructions on the  package. ? Start by drinking small amounts, about  cup (120 mL) every 5-10 minutes. ? Slowly increase how much you drink until you have taken the amount recommended by your health care provider.  Drink enough clear fluid to keep your urine clear or pale yellow. If you were told to drink an ORS, finish the ORS first, then start slowly drinking other clear fluids. Drink fluids such as: ? Water. Do not drink only water. Doing that can lead to having too little salt (sodium) in the body (hyponatremia). ? Ice chips. ? Fruit juice that you have added water to (diluted fruit juice). ? Low-calorie sports drinks.  Avoid: ? Alcohol. ? Drinks that contain a lot of sugar. These include high-calorie sports drinks, fruit juice that is not diluted, and soda. ? Caffeine. ? Foods that are greasy or contain a lot of fat or sugar.  Take over-the-counter and prescription medicines only as told by your health care provider.  Do not take sodium tablets. This can lead to having too much sodium in the body (hypernatremia).  Eat foods that contain a healthy balance of electrolytes, such as bananas, oranges, potatoes, tomatoes, and spinach.  Keep all follow-up visits as told by your health care provider. This is important. Contact a health care provider if:  You have abdominal pain that: ? Gets worse. ? Stays in one area (localizes).  You have a rash.  You have a stiff neck.  You are more irritable than usual.  You are sleepier or more difficult to wake up than usual.  You feel weak or dizzy.  You feel very thirsty.  You have urinated only a small amount of very dark urine over 6-8 hours. Get help right away if:  You have symptoms of severe dehydration.  You cannot drink fluids without vomiting.  Your symptoms get worse with treatment.  You have a fever.  You have a severe headache.  You have vomiting or diarrhea that: ? Gets worse. ? Does not go away.  You have blood or green matter  (bile) in your vomit.  You have blood in your stool. This may cause stool to look black and tarry.  You have not urinated in 6-8 hours.  You faint.  Your heart rate while sitting still is over 100 beats a minute.  You have trouble breathing. This information is not intended to replace advice given to you by your health care provider. Make sure you discuss any questions you have with your health care provider. Document Released: 03/25/2005 Document Revised: 10/20/2015 Document Reviewed: 05/19/2015 Elsevier Interactive Patient Education  [employer].

## 2018-03-06 ENCOUNTER — Other Ambulatory Visit: Payer: Self-pay | Admitting: Family

## 2018-03-06 DIAGNOSIS — C4372 Malignant melanoma of left lower limb, including hip: Secondary | ICD-10-CM

## 2018-03-06 LAB — IRON AND TIBC
Iron: 74 ug/dL (ref 42–163)
Saturation Ratios: 46 % (ref 20–55)
TIBC: 160 ug/dL — ABNORMAL LOW (ref 202–409)
UIBC: 86 ug/dL — ABNORMAL LOW (ref 117–376)

## 2018-03-06 LAB — FERRITIN: Ferritin: 107 ng/mL (ref 24–336)

## 2018-03-09 NOTE — Telephone Encounter (Signed)
Pharmacy notified that it was because pt was given RX to have filled in October and November but ok given

## 2018-03-09 NOTE — Telephone Encounter (Signed)
Copied from [place] [id number]. Topic: General - Other >> Mar 09, 2018 12:25 PM Yvette Rack wrote: Reason for CRM: Suezanne Jacquet with [facility] requests clarification on the Rx for HYDROmorphone (DILAUDID) 2 MG tablet. Suezanne Jacquet stated he needs to know why the request states please fill on or after 03/14/18 because they last filled it in September. Ben requests a call back. Cb# [phone]

## 2018-03-15 ENCOUNTER — Other Ambulatory Visit: Payer: Self-pay | Admitting: Hematology & Oncology

## 2018-03-15 DIAGNOSIS — C4372 Malignant melanoma of left lower limb, including hip: Secondary | ICD-10-CM

## 2018-03-20 ENCOUNTER — Inpatient Hospital Stay: Payer: Medicare Other

## 2018-03-20 ENCOUNTER — Other Ambulatory Visit: Payer: Self-pay

## 2018-03-20 ENCOUNTER — Encounter: Payer: Self-pay | Admitting: Hematology & Oncology

## 2018-03-20 ENCOUNTER — Telehealth: Payer: Self-pay | Admitting: Hematology & Oncology

## 2018-03-20 ENCOUNTER — Inpatient Hospital Stay: Payer: Medicare Other | Attending: Hematology & Oncology | Admitting: Hematology & Oncology

## 2018-03-20 VITALS — BP 145/75 | HR 58 | Temp 97.6°F | Resp 18

## 2018-03-20 VITALS — BP 145/75 | HR 58 | Temp 97.6°F | Resp 18 | Wt 144.0 lb

## 2018-03-20 DIAGNOSIS — C4372 Malignant melanoma of left lower limb, including hip: Secondary | ICD-10-CM

## 2018-03-20 DIAGNOSIS — G479 Sleep disorder, unspecified: Secondary | ICD-10-CM

## 2018-03-20 DIAGNOSIS — R197 Diarrhea, unspecified: Secondary | ICD-10-CM | POA: Diagnosis not present

## 2018-03-20 DIAGNOSIS — K518 Other ulcerative 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: Secondary | ICD-10-CM | POA: Diagnosis not present

## 2018-03-20 DIAGNOSIS — D500 Iron deficiency anemia secondary to blood loss (chronic): Secondary | ICD-10-CM

## 2018-03-20 LAB — CBC WITH DIFFERENTIAL (CANCER CENTER ONLY)
Abs Immature Granulocytes: 0.08 10*3/uL — ABNORMAL HIGH (ref 0.00–0.07)
Basophils Absolute: 0 10*3/uL (ref 0.0–0.1)
Basophils Relative: 0 %
Eosinophils Absolute: 0 10*3/uL (ref 0.0–0.5)
Eosinophils Relative: 0 %
HCT: 35.3 % — ABNORMAL LOW (ref 39.0–52.0)
Hemoglobin: 11.4 g/dL — ABNORMAL LOW (ref 13.0–17.0)
Immature Granulocytes: 1 %
Lymphocytes Relative: 11 %
Lymphs Abs: 1 10*3/uL (ref 0.7–4.0)
MCH: 32.3 pg (ref 26.0–34.0)
MCHC: 32.3 g/dL (ref 30.0–36.0)
MCV: 100 fL (ref 80.0–100.0)
Monocytes Absolute: 0.7 10*3/uL (ref 0.1–1.0)
Monocytes Relative: 8 %
Neutro Abs: 6.9 10*3/uL (ref 1.7–7.7)
Neutrophils Relative %: 80 %
Platelet Count: 173 10*3/uL (ref 150–400)
RBC: 3.53 MIL/uL — ABNORMAL LOW (ref 4.22–5.81)
RDW: 13.9 % (ref 11.5–15.5)
WBC Count: 8.7 10*3/uL (ref 4.0–10.5)
nRBC: 0 % (ref 0.0–0.2)

## 2018-03-20 LAB — CMP (CANCER CENTER ONLY)
ALT: 12 U/L (ref 0–44)
AST: 10 U/L — ABNORMAL LOW (ref 15–41)
Albumin: 3.1 g/dL — ABNORMAL LOW (ref 3.5–5.0)
Alkaline Phosphatase: 40 U/L (ref 38–126)
Anion gap: 7 (ref 5–15)
BUN: 16 mg/dL (ref 8–23)
CO2: 30 mmol/L (ref 22–32)
Calcium: 6.5 mg/dL — ABNORMAL LOW (ref 8.9–10.3)
Chloride: 100 mmol/L (ref 98–111)
Creatinine: 0.87 mg/dL (ref 0.61–1.24)
GFR, Est AFR Am: >60 mL/min (ref >60)
GFR, Estimated: >60 mL/min (ref >60)
Glucose, Bld: 92 mg/dL (ref 70–99)
Potassium: 2.5 mmol/L — CL (ref 3.5–5.1)
Sodium: 137 mmol/L (ref 135–145)
Total Bilirubin: 0.6 mg/dL (ref 0.3–1.2)
Total Protein: 4.7 g/dL — ABNORMAL LOW (ref 6.5–8.1)

## 2018-03-20 LAB — LACTATE DEHYDROGENASE: LDH: 211 U/L — ABNORMAL HIGH (ref 98–192)

## 2018-03-20 MED ORDER — SODIUM CHLORIDE 0.9% FLUSH
10.0000 mL | Freq: Once | INTRAVENOUS | Status: AC
  Administered 2018-03-20: 10 mL
  Filled 2018-03-20: qty 10

## 2018-03-20 MED ORDER — HEPARIN SOD (PORK) LOCK FLUSH 100 UNIT/ML IV SOLN
500.0000 [IU] | Freq: Once | INTRAVENOUS | Status: AC | PRN
  Administered 2018-03-20: 500 [IU]
  Filled 2018-03-20: qty 5

## 2018-03-20 MED ORDER — POTASSIUM CHLORIDE CRYS ER 20 MEQ PO TBCR
40.0000 meq | EXTENDED_RELEASE_TABLET | Freq: Once | ORAL | Status: AC
  Administered 2018-03-20: 40 meq via ORAL
  Filled 2018-03-20: qty 2

## 2018-03-20 NOTE — Progress Notes (Signed)
Patient ID: [name], male   DOB: 03/21/1946, 72 y.o.   MRN: [id number]    Cardiology Office Note    Date:  03/23/2018   ID:  [name], DOB [birth year], MRN [id number]  PCP:  Cassandria Anger, MD  Cardiologist:  Dr. Jenkins Rouge   Electrophysiologist:  n/a  No chief complaint on file.   History of Present Illness:     72 y.o. male with a hx of CAD, paroxysmal atrial fibrillation not on systemic anticoagulation given need for DAPT, hyperlipidemia, COPD, GERD, obstructive sleep apnea, rheumatoid arthritis, and hemochromatosis. He had left circumflex stent with spiral dissection in 2006. He had TEE DCCV in May 2012. He had h/o post lumbar fusion surgical wound infection with positive blood culture of pseudomonas and required 6 weeks of IV antibiotics near the end of 2014. He was admitted in March 2016 with chest tightness and dyspnea and underwent cardiac catheterization which showed new stenosis in proximal to mid LAD treated with overlapping drug-eluting stents. He was started on aspirin and Plavix. Sees ortho at [place] Dr. Pasty Spillers.   He has melanoma of left thigh excised NOvember 2018 bu Stage 3C Getting adjuvant Nivolumab every 4 weeks And phlebotomy to maintain ferritin less than 100   Has had diarrhea since Rx for melanomas Biggest issue has not seen GI yet  Living at [facility] now   Past Medical History:  Diagnosis Date  . Alcoholism /alcohol abuse ([place])    per family  . Allergic rhinitis   . Anxiety   . Bacterial infection   . CAD (coronary artery disease)    minimal coronary plaque in the LAD and right coronary system. 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 7-06. Last nuclear stress 11-17-06 fixed anterior/ inferior defect, no inducible ischemia, EF 81%  . Chronic back pain    "all over back"  . Chronic neck pain   . Depression   . Diverticulosis   . Dyspnea    with exertion  .  Dysrhythmia 01-24-12   past hx. A.Fib x1 episode-responded to med.  . Falls frequently    "since 02/2013" (06/16/2013)  . Family history of cancer   . Genetic testing 03/06/2017   Multi-Cancer panel (83 genes) @ [facility] - No pathogenic mutations detected  . GERD (gastroesophageal reflux disease)   . Hemochromatosis    dx'd 14 yrs ago last ferritin Aug 11, 08 52 (22-322), Fe 136 ("I had 250 phlebotomies for that")  . High cholesterol    hx  . Hx of colonic polyps   . Hx of colonoscopy   . Hypertension   . Iron deficiency anemia due to chronic blood loss 12/27/2016  . Malignant melanoma of knee, left ([place])   . Myocardial infarction [facility]) 2006   "related to catheterization"  . Narcotic abuse ([place])    per family  . Osteoarthritis   . PONV (postoperative nausea and vomiting)   . RA (rheumatoid arthritis) ([place])     Past Surgical History:  Procedure Laterality Date  . ABDOMINAL ADHESION SURGERY  ~ 1968  . ANKLE RECONSTRUCTION Right 6-09   [facility]  . APPENDECTOMY  ~ 1956  . ASPIRATION OF ABSCESS Left 03/27/2017   Procedure: ASPIRATION OF SEROMA;  Surgeon: Stark Klein, MD;  Location: [place];  Service: General;  Laterality: Left;  . BONE TUMOR RESECTION  ~ 1954   "taken off my mastoid"  . CARPAL TUNNEL  RELEASE Right 1990's  . CATARACT EXTRACTION, BILATERAL Bilateral 01-24-12  . CORONARY ANGIOPLASTY WITH STENT PLACEMENT  2006   "while repairing 1st stent, a second area tore and they had to place 2nd stent " ?LAD & CX  . CORONARY ANGIOPLASTY WITH STENT PLACEMENT  06/17/2014  . CYST EXCISION  "several OR's"   "backX 2, back of my neck, face, inside right bicept, chest, wrist"  . FOOT SURGERY Right 11-08   for removal of bone spurs-  . HAMMER TOE SURGERY Right 07/2012   "broke 4 hammertoes"   . HARDWARE REMOVAL  03/09/2012   Procedure: HARDWARE REMOVAL;  Surgeon: Nita Sells, MD;  Location: [place];  Service: Orthopedics;  Laterality: Left;   Hardware Removal from Left Shoulder  . HARVEST BONE GRAFT  02/06/2012   Procedure: HARVEST ILIAC BONE GRAFT;  Surgeon: Nita Sells, MD;  Location: [facility];  Service: Orthopedics;;  bone marrow aspirqation   . INGUINAL HERNIA REPAIR Bilateral   . JOINT REPLACEMENT    . KNEE SURGERY Left ~ 2003   "6-12 months after uni knee removed synovial sack"  . LEFT HEART CATHETERIZATION WITH CORONARY ANGIOGRAM N/A 06/17/2014   PCI of diffuse severe stenosis in the proximal to mid LAD using overlapping drug-eluting stents.  . LUMBAR WOUND DEBRIDEMENT N/A 03/17/2013   Procedure: Incision and drainage of superficial lumbar wound;  Surgeon: Floyce Stakes, MD;  Location: [place] [facility];  Service: Neurosurgery;  Laterality: N/A;  Incision and drainage of superficial lumbar wound  . MECKEL DIVERTICULUM EXCISION  ~ 1956  . MELANOMA EXCISION WITH SENTINEL LYMPH NODE BIOPSY Left 02/12/2017   WIDE LOCAL EXCISION LEFT KNEE MELANOMA, ADVANCEMENT FLAP CLOSURE, AND SENTINEL LYMPH NODE MAPPING AND BIOPSY.  [name] MELANOMA EXCISION WITH SENTINEL LYMPH NODE BIOPSY Left 02/12/2017   Procedure: WIDE LOCAL EXCISION LEFT KNEE MELANOMA, ADVANCEMENT FLAP CLOSURE, AND SENTINEL LYMPH NODE MAPPING AND BIOPSY.;  Surgeon: Stark Klein, MD;  Location: [facility];  Service: General;  Laterality: Left;  GENERAL AND LOCAL  . ORIF SHOULDER FRACTURE  02/06/2012   Procedure: OPEN REDUCTION INTERNAL FIXATION (ORIF) SHOULDER FRACTURE;  Surgeon: Nita Sells, MD;  Location: [facility];  Service: Orthopedics;  Laterality: Left;  ORIF of a Left Shoulder Fracture with  Iliac Crest Bone Graft aspiration   . PORTACATH PLACEMENT Left 03/27/2017   Procedure: INSERTION PORT-A-CATH;  Surgeon: Stark Klein, MD;  Location: [place];  Service: General;  Laterality: Left;  . POSTERIOR LUMBAR FUSION  12-10   L4-5 diskectomy w/ fusion, cage placement and rods; [place]  . POSTERIOR LUMBAR FUSION 4 LEVEL N/A 03/02/2013   Procedure:  Lumbar One to Sacral One Posterior lumbar interbody fusion;  Surgeon: Floyce Stakes, MD;  Location: [place] [facility];  Service: Neurosurgery;  Laterality: N/A;  L1 to S1 Posterior lumbar interbody fusion  . REPLACEMENT UNICONDYLAR JOINT KNEE Left ~ 2003   "~ 6 months after total knee replaced"  . SHOULDER ARTHROSCOPY Left ~ 2004 X 2   "@ [facility]; left bone splinter in & had to clean it out"  . TOTAL ANKLE REPLACEMENT Right 2008   at [facility]  . TOTAL KNEE ARTHROPLASTY Bilateral 2002  . TOTAL SHOULDER REPLACEMENT Left 2006  . TOTAL SHOULDER REPLACEMENT Right ~ 2007   Dr. Marlou Sa    Current Outpatient Medications  Medication Sig Dispense Refill  . albuterol (PROVENTIL HFA;VENTOLIN HFA) 108 (90 Base) MCG/ACT inhaler Inhale 1-2 puffs into the lungs every 4 (four) hours  as needed for wheezing or shortness of breath. 1 Inhaler 1  . alfuzosin (UROXATRAL) 10 MG 24 hr tablet Take 10 mg by mouth at bedtime.   0  . ALPRAZolam (XANAX) 0.5 MG tablet Take 1 tablet (0.5 mg total) by mouth 2 (two) times daily as needed. for anxiety 60 tablet 3  . Cholecalciferol (VITAMIN D3) 2000 UNITS capsule Take 1 capsule (2,000 Units total) by mouth daily. 100 capsule 3  . clopidogrel (PLAVIX) 75 MG tablet TAKE 1 TABLET BY MOUTH EVERY DAY WITH BREAKFAST 90 tablet 3  . dronabinol (MARINOL) 5 MG capsule Take 1 capsule (5 mg total) by mouth 2 (two) times daily before a meal. 60 capsule 2  . FLUoxetine (PROZAC) 40 MG capsule Take 1 capsule (40 mg total) by mouth 2 (two) times daily. 180 capsule 0  . fluticasone (FLONASE) 50 MCG/ACT nasal spray INSTILL 2 SPRAYS IN EACH NOSTRIL EVERY DAY 16 g 11  . gabapentin (NEURONTIN) 300 MG capsule Take 1 capsule (300 mg total) by mouth 2 (two) times daily. 180 capsule 1  . gabapentin (NEURONTIN) 600 MG tablet gabapentin 600 mg tablet    . HYDROmorphone (DILAUDID) 2 MG tablet Take 1 tablet (2 mg total) by mouth every 8 (eight) hours as needed for severe pain. Please fill on or after 03/14/18 120  tablet 0  . lidocaine-prilocaine (EMLA) cream Apply 1 application topically as needed. 30 g 5  . losartan-hydrochlorothiazide (HYZAAR) 50-12.5 MG tablet Take 1 tablet by mouth daily. 90 tablet 3  . Melatonin 5 MG TABS Take 5 mg by mouth at bedtime. Takes 10 mg at HS    . metoprolol tartrate (LOPRESSOR) 25 MG tablet Take 1 tablet (25 mg total) by mouth 2 (two) times daily. 180 tablet 1  . mupirocin ointment (BACTROBAN) 2 % APPLY AA OF THE SKIN QD  0  . Omega-3 Fatty Acids (FISH OIL OMEGA-3) 1000 MG CAPS Take 1,000 mg by mouth daily.     . ondansetron (ZOFRAN) 8 MG tablet Take 1 tablet (8 mg total) by mouth 3 (three) times daily. 90 tablet 3  . pantoprazole (PROTONIX) 40 MG tablet Take 1 tablet (40 mg total) by mouth every evening. 90 tablet 1  . potassium chloride SA (K-DUR,KLOR-CON) 20 MEQ tablet DISSOLVE 4 TABLETS IN 4 TO 8 OUNCES OF WATER AND DRINK BY MOUTH ONCE ON DAY 1 THEN DISSOLVE 2 TABLETS AND DRINK ONCE DAILY FOR 10 DAYS 216 tablet 0  . potassium chloride SA (K-DUR,KLOR-CON) 20 MEQ tablet Take 2 tablets (40 mEq total) by mouth 2 (two) times daily. Take 2 tablets 2x a day for 5 days, then take 1 tablet 2x a day. 60 tablet 2  . pravastatin (PRAVACHOL) 20 MG tablet Take 1 tablet (20 mg total) by mouth daily. (Patient taking differently: Take 20 mg by mouth every evening. ) 90 tablet 1  . predniSONE (DELTASONE) 20 MG tablet TAKE 1 TABLET(20 MG) BY MOUTH DAILY WITH BREAKFAST (Patient taking differently: Take 10 mg by mouth daily with breakfast. ) 90 tablet 0  . predniSONE (DELTASONE) 5 MG tablet Take 1 tablet (5 mg total) by mouth daily with breakfast. 90 tablet 2  . prochlorperazine (COMPAZINE) 10 MG tablet TAKE 1 TABLET(10 MG) BY MOUTH EVERY 6 HOURS AS NEEDED FOR NAUSEA OR VOMITING 385 tablet 1  . promethazine (PHENERGAN) 25 MG/ML injection promethazine 25 mg/mL injection solution  Take 1 mL by injection route for 1 day.    . tadalafil (CIALIS) 5 MG tablet Take  5 mg by mouth as needed.    .  traZODone (DESYREL) 100 MG tablet TAKE 1 TABLET(100 MG) BY MOUTH AT BEDTIME AS NEEDED FOR SLEEP 30 tablet 2  . vitamin B-12 (CYANOCOBALAMIN) 1000 MCG tablet Take 1,000 mcg by mouth daily.      No current facility-administered medications for this visit.     Allergies:   Cefepime; Cephaeline; Morphine and related; Penicillins; Morphine; Oxycodone-acetaminophen; and Doxycycline   Social History   Socioeconomic History  . Marital status: Significant Other    Spouse name: Not on file  . Number of children: 2  . Years of education: 50  . Highest education level: Not on file  Occupational History  . 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    Comment: self employed  Social Needs  . Financial resource strain: Not hard at all  . Food insecurity:    Worry: Never true    Inability: Never true  . Transportation needs:    Medical: No    Non-medical: No  Tobacco Use  . Smoking status: Never Smoker  . Smokeless tobacco: Never Used  Substance and Sexual Activity  . Alcohol use: No    Alcohol/week: 0.0 standard drinks    Comment: "I do not drink anymore"  . Drug use: No  . Sexual activity: Yes    Partners: Female  Lifestyle  . Physical activity:    Days per week: 0 days    Minutes per session: 0 min  . Stress: Very much  Relationships  . Social connections:    Talks on phone: More than three times a week    Gets together: More than three times a week    Attends religious service: More than 4 times per year    Active member of club or organization: Yes    Attends meetings of clubs or organizations: More than 4 times per year    Relationship status: Living with partner  Other Topics Concern  . Not on file  Social History Narrative   [employer] [place]. Married 1972-27yr seperated/divorced. Engaged April '11. 1 son '74; step-daughter '70. 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 During '11 discovered an [employer]- $500,000+ loss. Is handling this OK - will keep the business running.     Family History:  The patient's family history includes Cancer in his maternal grandmother; Cancer - Lung in his mother; Cancer - Prostate (age of onset: 723 in his father; Colon polyps in his father; Coronary artery disease in his father and maternal aunt; Heart attack in his brother and maternal aunt; Hemochromatosis in his sister; Hyperlipidemia in his brother; Hypertension in his father; Lung cancer in his mother; Macular degeneration in his mother; Melanoma in his mother; Other in his brother and mother; Prostate cancer in his father; Rheum arthritis in his sister; Thyroid disease in his sister; Uterine cancer in his mother.   ROS:   Please see the history of present illness.    ROS All other systems reviewed and are negative.   PHYSICAL EXAM:   VS:  BP 104/68   Pulse 78   Ht 5' 6"  (1.676 m)   Wt 145 lb 1.9 oz (65.8 kg)   SpO2 96%   BMI 23.42 kg/m    Affect appropriate Chronically ill male  HEENT: normal Neck supple with no adenopathy JVP normal no bruits no thyromegaly Lungs clear  with no wheezing and good diaphragmatic motion Heart:  S1/S2 no murmur, no rub, gallop or click PMI normal Abdomen: benighn, BS positve, no tenderness, no AAA no bruit.  No HSM or HJR Distal pulses intact with no bruits Plus 2 bilateral ankle edema Neuro non-focal Skin warm and dry No muscular weakness Arthritic changes in hands    Wt Readings from Last 3 Encounters:  03/23/18 145 lb 1.9 oz (65.8 kg)  03/20/18 144 lb (65.3 kg)  03/04/18 136 lb (61.7 kg)      Studies/Labs Reviewed:   EKG:  03/28/15   It showed NSR rate 68  Normal  01/29/17 SR rate 60 [id number]   Recent Labs: 09/18/2017: TSH 0.680 03/20/2018: ALT 12; BUN 16; Creatinine 0.87; Hemoglobin 11.4; Platelet Count 173; Potassium 2.5; Sodium 137   Recent Lipid Panel    Component Value  Date/Time   CHOL 128 03/10/2015 1008   TRIG 95.0 03/10/2015 1008   HDL 39.30 03/10/2015 1008   CHOLHDL 3 03/10/2015 1008   VLDL 19.0 03/10/2015 1008   [place] 70 03/10/2015 1008    Additional studies/ records that were reviewed today include:   LHC 06/17/14 Coronary angiography: Coronary dominance: right Left mainstem: Normal Left anterior descending (LAD): 80% proximal lesion before D1 50% just distal to D1 80% distal D1 30% multiple discrete Left circumflex (LCx): Primarily AV groove and OM1 Widely patent stents in OM Right coronary artery (RCA): Dominant 30% proximal 50% Mid vessel stenosis  Left ventriculography: Left ventricular systolic function is normal, LVEF is estimated at 55-65%, there is no significant mitral regurgitation  Final Conclusions: New LAD lesions patent stents in OM Recommendations: Reviewed with Dr Burt Knack PCI/Stent mulitple sights in LAD  Conclusions: Successful PCI of diffuse severe stenosis in the proximal to mid LAD using overlapping drug-eluting stent platforms  Recommendations: Dual antiplatelet therapy with aspirin and Plavix for at least 12 months.   Lesion Data: Vessel: LAD/proximal to mid Percent stenosis (pre): 80 TIMI-flow (pre): 3 Stent: 2.5 x 24 mm Promus DES and 3.0 x 20 mm Promus DES (overlapping) Percent stenosis (post): 0 TIMI-flow (post): 3  Myoview 05/20/14 Low risk stress nuclear study Poor quality scan with marked gut uptake and diaphragmatic motion on RAW images. Cannot r/o inferolateral wall ischemia.  LV Ejection Fraction: 63%. LV Wall Motion: NL LV Function; NL Wall Motion   ASSESSMENT:    CAD   PLAN:  In order of problems listed above:  1. CAD s/p overlapping DESx2 to LAD on 06/17/2014 Previous stenting circumflex 2006 with spiral dissection : no angina, Continue plavix Since not taking ASA P2Y has been good in past   2. COPD: stable no active wheezing   3. Paroxysmal atrial fibrillation not on systemic  anticoagulation given need for DAPT  s/p TEE DCCV in May 2012, previously on pradaxa, off pradaxa now given need for Plavix  4. Hyperlipidemia: LFT and lipid panel stable on 03/10/2015. Continue pravachol  5. OSA:  Wearing CPAP   6. Rheumatoid arthritis continue pain meds Inactivity has not helped    7. Melanoma:  F/u oncology getting adjuvant Rx stage 3C PET scan 02/2017 was stable   Jenkins Rouge

## 2018-03-20 NOTE — Patient Instructions (Signed)
Implanted Port Home Guide An implanted port is a type of central line that is placed under the skin. Central lines are used to provide IV access when treatment or nutrition needs to be given through a person's veins. Implanted ports are used for long-term IV access. An implanted port may be placed because:  You need IV medicine that would be irritating to the small veins in your hands or arms.  You need long-term IV medicines, such as antibiotics.  You need IV nutrition for a long period.  You need frequent blood draws for lab tests.  You need dialysis.  Implanted ports are usually placed in the chest area, but they can also be placed in the upper arm, the abdomen, or the leg. An implanted port has two main parts:  Reservoir. The reservoir is round and will appear as a small, raised area under your skin. The reservoir is the part where a needle is inserted to give medicines or draw blood.  Catheter. The catheter is a thin, flexible tube that extends from the reservoir. The catheter is placed into a large vein. Medicine that is inserted into the reservoir goes into the catheter and then into the vein.  How will I care for my incision site? Do not get the incision site wet. Bathe or shower as directed by your health care provider. How is my port accessed? Special steps must be taken to access the port:  Before the port is accessed, a numbing cream can be placed on the skin. This helps numb the skin over the port site.  Your health care provider uses a sterile technique to access the port. ? Your health care provider must put on a mask and sterile gloves. ? The skin over your port is cleaned carefully with an antiseptic and allowed to dry. ? The port is gently pinched between sterile gloves, and a needle is inserted into the port.  Only "non-coring" port needles should be used to access the port. Once the port is accessed, a blood return should be checked. This helps ensure that the port  is in the vein and is not clogged.  If your port needs to remain accessed for a constant infusion, a clear (transparent) bandage will be placed over the needle site. The bandage and needle will need to be changed every week, or as directed by your health care provider.  Keep the bandage covering the needle clean and dry. Do not get it wet. Follow your health care provider's instructions on how to take a shower or bath while the port is accessed.  If your port does not need to stay accessed, no bandage is needed over the port.  What is flushing? Flushing helps keep the port from getting clogged. Follow your health care provider's instructions on how and when to flush the port. Ports are usually flushed with saline solution or a medicine called heparin. The need for flushing will depend on how the port is used.  If the port is used for intermittent medicines or blood draws, the port will need to be flushed: ? After medicines have been given. ? After blood has been drawn. ? As part of routine maintenance.  If a constant infusion is running, the port may not need to be flushed.  How long will my port stay implanted? The port can stay in for as long as your health care provider thinks it is needed. When it is time for the port to come out, surgery will be   done to remove it. The procedure is similar to the one performed when the port was put in. When should I seek immediate medical care? When you have an implanted port, you should seek immediate medical care if:  You notice a bad smell coming from the incision site.  You have swelling, redness, or drainage at the incision site.  You have more swelling or pain at the port site or the surrounding area.  You have a fever that is not controlled with medicine.  This information is not intended to replace advice given to you by your health care provider. Make sure you discuss any questions you have with your health care provider. Document  Released: 03/25/2005 Document Revised: 08/31/2015 Document Reviewed: 11/30/2012 Elsevier Interactive Patient Education  2017 [employer]  

## 2018-03-20 NOTE — Progress Notes (Addendum)
Hematology and Oncology Follow Up Visit  [name] [id number] 12-25-1945 72 y.o. 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  He says he still having diarrhea.  I will little bit surprised by this.  This I suppose could still be from the nivolumab.  He has not had nivolumab for a couple of months.  He stopped the colchicine.  He is really not on any medications that I can tell would cause diarrhea.  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  It is not responded to prednisone.  I think our option now is Remicade.  I do not see any option otherwise.  His potassium is only 2.5.  I told increase his potassium to 40 mg p.o. twice daily.  We will give him 40 mg in the office today.  He has had no problems with cough.  There is no shortness of breath.  He has had no bleeding.  He has had no problems with his urine.  There is been no leg swelling.  His hemochromatosis is really not been a problem.  His last iron studies back in November showed a ferritin of 107 with iron saturation of 46%.  Overall, his performance status has ECOG 1-2   Medications:  Allergies as of 03/20/2018      Reactions   Cefepime Hives, Shortness Of Breath   Cephaeline Hives   Morphine And Related Shortness Of Breath, Nausea And Vomiting, Swelling, Other (See Comments)   Agitation, tolerates dilaudid Other reaction(s): Other (See Comments) Agitation, tolerates dilaudid   Penicillins Hives, Shortness Of Breath, Rash   Has patient had a PCN reaction causing immediate rash, facial/tongue/throat swelling, SOB or  lightheadedness with hypotension: Yes Has patient had a PCN reaction causing severe rash involving mucus membranes or skin necrosis: Yes Has patient had a PCN reaction that required hospitalization No Has patient had a PCN reaction occurring within the last 10 years: No If all of the above answers are "NO", then may proceed with Cephalosporin use.   Morphine    Oxycodone-acetaminophen    Doxycycline Rash      Medication List       Accurate as of March 20, 2018  3:09 PM. Always use your most recent med list.        albuterol 108 (90 Base) MCG/ACT inhaler Commonly known as:  PROVENTIL HFA;VENTOLIN HFA Inhale 1-2 puffs into the lungs every 4 (four) hours as needed for wheezing or shortness of breath.   alfuzosin 10 MG 24 hr tablet Commonly known as:  UROXATRAL Take 10 mg by mouth at bedtime.   ALPRAZolam 0.5 MG tablet Commonly known as:  XANAX Take 1 tablet (0.5 mg total) by mouth 2 (two) times daily as needed. for anxiety   CIALIS 5 MG tablet Generic drug:  tadalafil Take 5 mg by mouth as needed.   clopidogrel 75 MG tablet Commonly known as:  PLAVIX TAKE 1 TABLET BY MOUTH EVERY DAY WITH BREAKFAST   dronabinol 5 MG capsule Commonly known as:  MARINOL Take 1 capsule (5 mg total) by mouth 2 (two) times daily before a meal.  FISH OIL OMEGA-3 1000 MG Caps Take 1,000 mg by mouth daily.   FLUoxetine 40 MG capsule Commonly known as:  PROZAC Take 1 capsule (40 mg total) by mouth 2 (two) times daily.   fluticasone 50 MCG/ACT nasal spray Commonly known as:  FLONASE INSTILL 2 SPRAYS IN EACH NOSTRIL EVERY DAY   gabapentin 600 MG tablet Commonly known as:  NEURONTIN gabapentin 600 mg tablet   gabapentin 300 MG capsule Commonly known as:  NEURONTIN Take 1 capsule (300 mg total) by mouth 2 (two) times daily.   HYDROmorphone 2 MG tablet Commonly known as:  DILAUDID Take 1 tablet (2 mg total) by mouth every 8 (eight) hours as needed for severe pain. Please fill on or  after 03/14/18   lidocaine-prilocaine cream Commonly known as:  EMLA Apply 1 application topically as needed.   losartan-hydrochlorothiazide 50-12.5 MG tablet Commonly known as:  HYZAAR Take 1 tablet by mouth daily.   Melatonin 5 MG Tabs Take 5 mg by mouth at bedtime. Takes 10 mg at HS   metoprolol tartrate 25 MG tablet Commonly known as:  LOPRESSOR Take 1 tablet (25 mg total) by mouth 2 (two) times daily.   mupirocin ointment 2 % Commonly known as:  BACTROBAN APPLY AA OF THE SKIN QD   ondansetron 8 MG tablet Commonly known as:  ZOFRAN Take 1 tablet (8 mg total) by mouth 3 (three) times daily.   pantoprazole 40 MG tablet Commonly known as:  PROTONIX Take 1 tablet (40 mg total) by mouth every evening.   potassium chloride SA 20 MEQ tablet Commonly known as:  K-DUR,KLOR-CON DISSOLVE 4 TABLETS IN 4 TO 8 OUNCES OF WATER AND DRINK BY MOUTH ONCE ON DAY 1 THEN DISSOLVE 2 TABLETS AND DRINK ONCE DAILY FOR 10 DAYS   potassium chloride SA 20 MEQ tablet Commonly known as:  K-DUR,KLOR-CON Take 2 tablets (40 mEq total) by mouth 2 (two) times daily. Take 2 tablets 2x a day for 5 days, then take 1 tablet 2x a day.   pravastatin 20 MG tablet Commonly known as:  PRAVACHOL Take 1 tablet (20 mg total) by mouth daily.   predniSONE 5 MG tablet Commonly known as:  DELTASONE Take 1 tablet (5 mg total) by mouth daily with breakfast.   predniSONE 20 MG tablet Commonly known as:  DELTASONE TAKE 1 TABLET(20 MG) BY MOUTH DAILY WITH BREAKFAST   prochlorperazine 10 MG tablet Commonly known as:  COMPAZINE TAKE 1 TABLET(10 MG) BY MOUTH EVERY 6 HOURS AS NEEDED FOR NAUSEA OR VOMITING   promethazine 25 MG/ML injection Commonly known as:  PHENERGAN promethazine 25 mg/mL injection solution  Take 1 mL by injection route for 1 day.   traZODone 100 MG tablet Commonly known as:  DESYREL TAKE 1 TABLET(100 MG) BY MOUTH AT BEDTIME AS NEEDED FOR SLEEP   vitamin B-12 1000 MCG tablet Commonly known as:   CYANOCOBALAMIN Take 1,000 mcg by mouth daily.   Vitamin D3 50 MCG (2000 UT) capsule Take 1 capsule (2,000 Units total) by mouth daily.       Allergies:  Allergies  Allergen Reactions  . Cefepime Hives and Shortness Of Breath  . Cephaeline Hives  . Morphine And Related Shortness Of Breath, Nausea And Vomiting, Swelling and Other (See Comments)    Agitation, tolerates dilaudid Other reaction(s): Other (See Comments) Agitation, tolerates dilaudid  . Penicillins Hives, Shortness Of Breath and Rash    Has patient had a PCN reaction causing immediate rash, facial/tongue/throat swelling, SOB or lightheadedness  with hypotension: Yes Has patient had a PCN reaction causing severe rash involving mucus membranes or skin necrosis: Yes Has patient had a PCN reaction that required hospitalization No Has patient had a PCN reaction occurring within the last 10 years: No If all of the above answers are "NO", then may proceed with Cephalosporin use.   [name] Morphine   . Oxycodone-Acetaminophen   . Doxycycline Rash    Past Medical History, Surgical history, Social history, and Family History were reviewed and updated.  Review of Systems: Review of Systems  Constitutional: Positive for malaise/fatigue.  HENT: Negative.   Eyes: Negative.   Respiratory: Negative.   Cardiovascular: Negative.   Gastrointestinal: Positive for diarrhea.  Genitourinary: Negative.   Musculoskeletal: Positive for joint pain and neck pain.  Skin: Negative.   Neurological: Negative.   Endo/Heme/Allergies: Negative.       Physical Exam:  weight is 144 lb (65.3 kg). His temperature is 97.6 F (36.4 C). His blood pressure is 145/75 (abnormal) and his pulse is 58 (abnormal). His respiration is 18 and oxygen saturation is 98%.   Wt Readings from Last 3 Encounters:  03/20/18 144 lb (65.3 kg)  03/04/18 136 lb (61.7 kg)  01/08/18 144 lb 12 oz (65.7 kg)    Physical Exam Vitals signs reviewed.  HENT:     Head:  Normocephalic and atraumatic.  Eyes:     Pupils: Pupils are equal, round, and reactive to light.  Neck:     Musculoskeletal: Normal range of motion.  Cardiovascular:     Rate and Rhythm: Normal rate and regular rhythm.     Heart sounds: Normal heart sounds.  Pulmonary:     Effort: Pulmonary effort is normal.     Breath sounds: Normal breath sounds.  Abdominal:     General: Bowel sounds are normal.     Palpations: Abdomen is soft.  Musculoskeletal: Normal range of motion.        General: No tenderness or deformity.  Lymphadenopathy:     Cervical: No cervical adenopathy.  Skin:    General: Skin is warm and dry.     Findings: No erythema or rash.  Neurological:     Mental Status: He is alert and oriented to person, place, and time.  Psychiatric:        Behavior: Behavior normal.        Thought Content: Thought content normal.        Judgment: Judgment normal.      Lab Results  Component Value Date   WBC 8.7 03/20/2018   HGB 11.4 (L) 03/20/2018   HCT 35.3 (L) 03/20/2018   MCV 100.0 03/20/2018   PLT 173 03/20/2018   Lab Results  Component Value Date   FERRITIN 107 03/04/2018   IRON 74 03/04/2018   TIBC 160 (L) 03/04/2018   UIBC 86 (L) 03/04/2018   IRONPCTSAT 46 03/04/2018   Lab Results  Component Value Date   RETICCTPCT 1.9 03/24/2013   RBC 3.53 (L) 03/20/2018   No results found for: KPAFRELGTCHN, LAMBDASER, KAPLAMBRATIO No results found for: IGGSERUM, IGA, IGMSERUM No results found for: TOTALPROTELP, ALBUMINELP, A1GS, A2GS, BETS, BETA2SER, GAMS, MSPIKE, SPEI   Chemistry      Component Value Date/Time   NA 137 03/20/2018 1005   NA 140 04/10/2017 1046   NA 135 (L) 03/06/2017 1104   K 2.5 (LL) 03/20/2018 1005   K 3.7 04/10/2017 1046   K 4.2 03/06/2017 1104   CL 100 03/20/2018 1005   CL  98 04/10/2017 1046   CO2 30 03/20/2018 1005   CO2 31 04/10/2017 1046   CO2 30 (H) 03/06/2017 1104   BUN 16 03/20/2018 1005   BUN 21 04/10/2017 1046   BUN 20.0 03/06/2017  1104   CREATININE 0.87 03/20/2018 1005   CREATININE 0.9 04/10/2017 1046   CREATININE 1.0 03/06/2017 1104      Component Value Date/Time   CALCIUM 6.5 (L) 03/20/2018 1005   CALCIUM 8.9 04/10/2017 1046   CALCIUM 9.4 03/06/2017 1104   ALKPHOS 40 03/20/2018 1005   ALKPHOS 65 04/10/2017 1046   ALKPHOS 78 03/06/2017 1104   AST 10 (L) 03/20/2018 1005   AST 20 03/06/2017 1104   ALT 12 03/20/2018 1005   ALT 21 04/10/2017 1046   ALT 20 03/06/2017 1104   BILITOT 0.6 03/20/2018 1005   BILITOT 0.47 03/06/2017 1104       Impression and Plan: Mr. Orihuela is a very pleasant 72 yo caucasian gentleman with history of hemochromatosis, homozygous for the C282Y mutation which has not been a problem for him.   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   This diarrhea really bothers me.  I fear that the colitis that he has is secondary to his medications.  It is clearly known for fact that nivolumab induces a immune mediated colitis.  Since the prednisone has not stopped this, we will follow guidelines and then see about Remicade.  I really need to try Remicade to stop this.  Hopefully the Remicade will do the job for [place].  I will have him come in next week for Remicade.  I would like to see him back in a couple weeks and we can see how the diarrhea is doing.  If he still has diarrhea despite Remicade, then we will have to get him back to gastroenterology to see about some type of endoscopic approach.  We not yet tried cholestyramine.  This is another possibility.  [name], MD 12/13/20193:09 PM

## 2018-03-20 NOTE — Addendum Note (Signed)
Addended by: Perlie Gold on: 03/20/2018 12:40 PM   Modules accepted: Orders

## 2018-03-20 NOTE — Telephone Encounter (Signed)
Appts scheduled avs/calendar printed per 12/13 los

## 2018-03-23 ENCOUNTER — Ambulatory Visit (INDEPENDENT_AMBULATORY_CARE_PROVIDER_SITE_OTHER): Payer: Medicare Other | Admitting: Cardiovascular Disease

## 2018-03-23 ENCOUNTER — Encounter: Payer: Self-pay | Admitting: Cardiovascular Disease

## 2018-03-23 VITALS — BP 104/68 | HR 78 | Ht 66.0 in | Wt 145.1 lb

## 2018-03-23 DIAGNOSIS — I2510 Atherosclerotic heart disease of native coronary artery without angina pectoris: Secondary | ICD-10-CM

## 2018-03-23 DIAGNOSIS — E785 Hyperlipidemia, unspecified: Secondary | ICD-10-CM

## 2018-03-23 DIAGNOSIS — I10 Essential (primary) hypertension: Secondary | ICD-10-CM | POA: Diagnosis not present

## 2018-03-23 DIAGNOSIS — I480 Paroxysmal atrial fibrillation: Secondary | ICD-10-CM | POA: Diagnosis not present

## 2018-03-23 LAB — IRON AND TIBC
Iron: 136 ug/dL (ref 42–163)
Saturation Ratios: 85 % — ABNORMAL HIGH (ref 20–55)
TIBC: 160 ug/dL — ABNORMAL LOW (ref 202–409)
UIBC: 25 ug/dL — ABNORMAL LOW (ref 117–376)

## 2018-03-23 LAB — FERRITIN: Ferritin: 101 ng/mL (ref 24–336)

## 2018-03-23 NOTE — Patient Instructions (Signed)
Medication Instructions:   If you need a refill on your cardiac medications before your next appointment, please call your pharmacy.   Lab work:  If you have labs (blood work) drawn today and your tests are completely normal, you will receive your results only by: [name] MyChart Message (if you have MyChart) OR . A paper copy in the mail If you have any lab test that is abnormal or we need to change your treatment, we will call you to review the results.  Testing/Procedures: NONE ordered at this time.  Follow-Up: At [facility], you and your health needs are our priority.  As part of our continuing mission to provide you with exceptional heart care, we have created designated Provider Care Teams.  These Care Teams include your primary Cardiologist (physician) and Advanced Practice Providers (APPs -  Physician Assistants and Nurse Practitioners) who all work together to provide you with the care you need, when you need it. You will need a follow up appointment in 6 months.  Please call our office 2 months in advance to schedule this appointment.  You may see Jenkins Rouge, MD or one of the following Advanced Practice Providers on your designated Care Team:   Truitt Merle, NP Cecilie Kicks, NP . Kathyrn Drown, NP

## 2018-03-24 ENCOUNTER — Ambulatory Visit: Payer: Medicare Other | Admitting: Internal Medicine

## 2018-03-25 ENCOUNTER — Inpatient Hospital Stay: Payer: Medicare Other

## 2018-03-25 ENCOUNTER — Other Ambulatory Visit: Payer: Self-pay | Admitting: Family

## 2018-03-25 ENCOUNTER — Other Ambulatory Visit: Payer: Self-pay

## 2018-03-25 VITALS — BP 139/75 | HR 63 | Temp 98.8°F | Resp 18

## 2018-03-25 DIAGNOSIS — K518 Other ulcerative 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: Secondary | ICD-10-CM

## 2018-03-25 DIAGNOSIS — C4372 Malignant melanoma of left lower limb, including hip: Secondary | ICD-10-CM

## 2018-03-25 DIAGNOSIS — D500 Iron deficiency anemia secondary to blood loss (chronic): Secondary | ICD-10-CM

## 2018-03-25 DIAGNOSIS — K521 Toxic gastroenteritis and colitis: Secondary | ICD-10-CM

## 2018-03-25 LAB — CBC WITH DIFFERENTIAL (CANCER CENTER ONLY)
Abs Immature Granulocytes: 0.08 10*3/uL — ABNORMAL HIGH (ref 0.00–0.07)
Basophils Absolute: 0 10*3/uL (ref 0.0–0.1)
Basophils Relative: 0 %
Eosinophils Absolute: 0 10*3/uL (ref 0.0–0.5)
Eosinophils Relative: 0 %
HCT: 34.7 % — ABNORMAL LOW (ref 39.0–52.0)
Hemoglobin: 11.1 g/dL — ABNORMAL LOW (ref 13.0–17.0)
Immature Granulocytes: 1 %
Lymphocytes Relative: 10 %
Lymphs Abs: 0.8 10*3/uL (ref 0.7–4.0)
MCH: 33 pg (ref 26.0–34.0)
MCHC: 32 g/dL (ref 30.0–36.0)
MCV: 103.3 fL — ABNORMAL HIGH (ref 80.0–100.0)
Monocytes Absolute: 0.6 10*3/uL (ref 0.1–1.0)
Monocytes Relative: 7 %
Neutro Abs: 6.9 10*3/uL (ref 1.7–7.7)
Neutrophils Relative %: 82 %
Platelet Count: 133 10*3/uL — ABNORMAL LOW (ref 150–400)
RBC: 3.36 MIL/uL — ABNORMAL LOW (ref 4.22–5.81)
RDW: 14.2 % (ref 11.5–15.5)
WBC Count: 8.3 10*3/uL (ref 4.0–10.5)
nRBC: 0 % (ref 0.0–0.2)

## 2018-03-25 LAB — CMP (CANCER CENTER ONLY)
ALT: 14 U/L (ref 0–44)
AST: 11 U/L — ABNORMAL LOW (ref 15–41)
Albumin: 3.1 g/dL — ABNORMAL LOW (ref 3.5–5.0)
Alkaline Phosphatase: 49 U/L (ref 38–126)
Anion gap: 5 (ref 5–15)
BUN: 13 mg/dL (ref 8–23)
CO2: 28 mmol/L (ref 22–32)
Calcium: 7 mg/dL — ABNORMAL LOW (ref 8.9–10.3)
Chloride: 107 mmol/L (ref 98–111)
Creatinine: 0.72 mg/dL (ref 0.61–1.24)
GFR, Est AFR Am: >60 mL/min (ref >60)
GFR, Estimated: >60 mL/min (ref >60)
Glucose, Bld: 89 mg/dL (ref 70–99)
Potassium: 3.6 mmol/L (ref 3.5–5.1)
Sodium: 140 mmol/L (ref 135–145)
Total Bilirubin: 0.8 mg/dL (ref 0.3–1.2)
Total Protein: 4.6 g/dL — ABNORMAL LOW (ref 6.5–8.1)

## 2018-03-25 MED ORDER — HEPARIN SOD (PORK) LOCK FLUSH 100 UNIT/ML IV SOLN
500.0000 [IU] | Freq: Once | INTRAVENOUS | Status: AC
  Administered 2018-03-25: 500 [IU] via INTRAVENOUS
  Filled 2018-03-25: qty 5

## 2018-03-25 MED ORDER — SODIUM CHLORIDE 0.9 % IV SOLN
5.0000 mg/kg | Freq: Once | INTRAVENOUS | Status: AC
  Administered 2018-03-25: 300 mg via INTRAVENOUS
  Filled 2018-03-25: qty 30

## 2018-03-25 MED ORDER — SODIUM CHLORIDE 0.9 % IV SOLN
INTRAVENOUS | Status: DC
  Administered 2018-03-25: 10:00:00 via INTRAVENOUS
  Filled 2018-03-25: qty 250

## 2018-03-25 MED ORDER — SODIUM CHLORIDE 0.9% FLUSH
10.0000 mL | INTRAVENOUS | Status: DC | PRN
  Administered 2018-03-25: 10 mL via INTRAVENOUS
  Filled 2018-03-25: qty 10

## 2018-03-25 NOTE — Patient Instructions (Signed)
Infliximab injection What is this medicine? INFLIXIMAB (in [place] i mab) is used to treat Crohn's disease and ulcerative colitis. It is also used to treat ankylosing spondylitis, plaque psoriasis, and some forms of arthritis. This medicine may be used for other purposes; ask your health care provider or pharmacist if you have questions. COMMON BRAND NAME(S): INFLECTRA, Remicade, RENFLEXIS What should I tell my health care provider before I take this medicine? They need to know if you have any of these conditions: -cancer -current or past resident of [place] or [place] -diabetes -exposure to tuberculosis -Guillain-Barre syndrome -heart failure -hepatitis or liver disease -immune system problems -infection -lung or breathing disease, like COPD -multiple sclerosis -receiving phototherapy for the skin -seizure disorder -an unusual or allergic reaction to infliximab, mouse proteins, other medicines, foods, dyes, or preservatives -pregnant or trying to get pregnant -breast-feeding How should I use this medicine? This medicine is for injection into a vein. It is usually given by a health care professional in a hospital or clinic setting. A special MedGuide will be given to you by the pharmacist with each prescription and refill. Be sure to read this information carefully each time. Talk to your pediatrician regarding the use of this medicine in children. While this drug may be prescribed for children as young as 26 years of age for selected conditions, precautions do apply. Overdosage: If you think you have taken too much of this medicine contact a poison control center or emergency room at once. NOTE: This medicine is only for you. Do not share this medicine with others. What if I miss a dose? It is important not to miss your dose. Call your doctor or health care professional if you are unable to keep an appointment. What may interact with this medicine? Do not take this  medicine with any of the following medications: -biologic medicines such as abatacept, adalimumab, anakinra, certolizumab, etanercept, golimumab, rituximab, secukinumab, tocilizumab, tofactinib, ustekinumab -live vaccines This list may not describe all possible interactions. Give your health care provider a list of all the medicines, herbs, non-prescription drugs, or dietary supplements you use. Also tell them if you smoke, drink alcohol, or use illegal drugs. Some items may interact with your medicine. What should I watch for while using this medicine? Your condition will be monitored carefully while you are receiving this medicine. Visit your doctor or health care professional for regular checks on your progress. You may need blood work done while you are taking this medicine. Before beginning therapy, your doctor may do a test to see if you have been exposed to tuberculosis. Call your doctor or health care professional for advice if you get a fever, chills or sore throat, or other symptoms of a cold or flu. Do not treat yourself. This drug decreases your body's ability to fight infections. Try to avoid being around people who are sick. This medicine may make the symptoms of heart failure worse in some patients. If you notice symptoms such as increased shortness of breath or swelling of the ankles or legs, contact your health care provider right away. If you are going to have surgery or dental work, tell your health care professional or dentist that you have received this medicine. If you take this medicine for plaque psoriasis, stay out of the sun. If you cannot avoid being in the sun, wear protective clothing and use sunscreen. Do not use sun lamps or tanning beds/booths. Talk to your doctor about your risk of cancer. You may be  more at risk for certain types of cancers if you take this medicine. What side effects may I notice from receiving this medicine? Side effects that you should report to your  doctor or health care professional as soon as possible: -allergic reactions like skin rash, itching or hives, swelling of the face, lips, or tongue -breathing problems -changes in vision -chest pain -fever or chills, usually related to the infusion -joint pain -pain, tingling, numbness in the hands or feet -redness, blistering, peeling or loosening of the skin, including inside the mouth -seizures -signs of infection - fever or chills, cough, sore throat, flu-like symptoms, pain or difficulty passing urine -signs and symptoms of liver injury like dark yellow or brown urine; general ill feeling; light-colored stools; loss of appetite; nausea; right upper belly pain; unusually weak or tired; yellowing of the eyes or skin -signs and symptoms of a stroke like changes in vision; confusion; trouble speaking or understanding; severe headaches; sudden numbness or weakness of the face, arm or leg; trouble walking; dizziness; loss of balance or coordination -swelling of the ankles, feet, or hands -swollen lymph nodes in the neck, underarm, or groin areas -unusual bleeding or bruising -unusually weak or tired Side effects that usually do not require medical attention (report to your doctor or health care professional if they continue or are bothersome): -headache -nausea -stomach pain -upset stomach This list may not describe all possible side effects. Call your doctor for medical advice about side effects. You may report side effects to FDA at 1-800-FDA-1088. Where should I keep my medicine? This drug is given in a hospital or clinic and will not be stored at home. NOTE: This sheet is a summary. It may not cover all possible information. If you have questions about this medicine, talk to your doctor, pharmacist, or health care provider.  2019 Elsevier/Gold Standard (2016-04-24 13:45:32)

## 2018-03-27 ENCOUNTER — Ambulatory Visit (INDEPENDENT_AMBULATORY_CARE_PROVIDER_SITE_OTHER): Payer: Medicare Other | Admitting: Internal Medicine

## 2018-03-27 ENCOUNTER — Encounter: Payer: Self-pay | Admitting: Internal Medicine

## 2018-03-27 ENCOUNTER — Other Ambulatory Visit: Payer: Medicare Other

## 2018-03-27 VITALS — BP 130/84 | HR 69 | Temp 97.7°F | Ht 66.0 in | Wt 146.0 lb

## 2018-03-27 DIAGNOSIS — E876 Hypokalemia: Secondary | ICD-10-CM

## 2018-03-27 DIAGNOSIS — I10 Essential (primary) hypertension: Secondary | ICD-10-CM | POA: Diagnosis not present

## 2018-03-27 DIAGNOSIS — R609 Edema, unspecified: Secondary | ICD-10-CM | POA: Diagnosis not present

## 2018-03-27 DIAGNOSIS — M05711 Rheumatoid arthritis with rheumatoid factor of right shoulder without organ or systems involvement: Secondary | ICD-10-CM | POA: Diagnosis not present

## 2018-03-27 DIAGNOSIS — I2510 Atherosclerotic heart disease of native coronary artery without angina pectoris: Secondary | ICD-10-CM | POA: Diagnosis not present

## 2018-03-27 DIAGNOSIS — R197 Diarrhea, unspecified: Secondary | ICD-10-CM

## 2018-03-27 MED ORDER — TORSEMIDE 10 MG PO TABS: 10.0000 mg | ORAL_TABLET | Freq: Every day | ORAL | 3 refills | Status: DC

## 2018-03-27 MED ORDER — HYDROMORPHONE HCL 2 MG PO TABS: 2.0000 mg | ORAL_TABLET | Freq: Three times a day (TID) | ORAL | 0 refills | Status: DC | PRN

## 2018-03-27 MED ORDER — DIPHENOXYLATE-ATROPINE 2.5-0.025 MG PO TABS: 1.0000 | ORAL_TABLET | Freq: Four times a day (QID) | ORAL | 2 refills | Status: DC | PRN

## 2018-03-27 NOTE — Progress Notes (Signed)
Subjective:  Patient ID: [name], male    DOB: 1946-01-31  Age: 72 y.o. MRN: [id number]  CC: No chief complaint on file.   HPI [name] presents for leg swelling R>L x 3 mo - worse C/o diarrhea x 2-3 mo Off chemo x6-8 wks  Outpatient Medications Prior to Visit  Medication Sig Dispense Refill  . albuterol (PROVENTIL HFA;VENTOLIN HFA) 108 (90 Base) MCG/ACT inhaler Inhale 1-2 puffs into the lungs every 4 (four) hours as needed for wheezing or shortness of breath. 1 Inhaler 1  . alfuzosin (UROXATRAL) 10 MG 24 hr tablet Take 10 mg by mouth at bedtime.   0  . ALPRAZolam (XANAX) 0.5 MG tablet Take 1 tablet (0.5 mg total) by mouth 2 (two) times daily as needed. for anxiety 60 tablet 3  . Cholecalciferol (VITAMIN D3) 2000 UNITS capsule Take 1 capsule (2,000 Units total) by mouth daily. 100 capsule 3  . clopidogrel (PLAVIX) 75 MG tablet TAKE 1 TABLET BY MOUTH EVERY DAY WITH BREAKFAST 90 tablet 3  . dronabinol (MARINOL) 5 MG capsule Take 1 capsule (5 mg total) by mouth 2 (two) times daily before a meal. 60 capsule 2  . FLUoxetine (PROZAC) 40 MG capsule Take 1 capsule (40 mg total) by mouth 2 (two) times daily. 180 capsule 0  . fluticasone (FLONASE) 50 MCG/ACT nasal spray INSTILL 2 SPRAYS IN EACH NOSTRIL EVERY DAY 16 g 11  . gabapentin (NEURONTIN) 300 MG capsule Take 1 capsule (300 mg total) by mouth 2 (two) times daily. 180 capsule 1  . gabapentin (NEURONTIN) 600 MG tablet gabapentin 600 mg tablet    . HYDROmorphone (DILAUDID) 2 MG tablet Take 1 tablet (2 mg total) by mouth every 8 (eight) hours as needed for severe pain. Please fill on or after 03/14/18 120 tablet 0  . lidocaine-prilocaine (EMLA) cream Apply 1 application topically as needed. 30 g 5  . losartan-hydrochlorothiazide (HYZAAR) 50-12.5 MG tablet Take 1 tablet by mouth daily. 90 tablet 3  . Melatonin 5 MG TABS Take 5 mg by mouth at bedtime. Takes 10 mg at HS    . metoprolol tartrate (LOPRESSOR) 25 MG tablet Take 1  tablet (25 mg total) by mouth 2 (two) times daily. 180 tablet 1  . mupirocin ointment (BACTROBAN) 2 % APPLY AA OF THE SKIN QD  0  . Omega-3 Fatty Acids (FISH OIL OMEGA-3) 1000 MG CAPS Take 1,000 mg by mouth daily.     . ondansetron (ZOFRAN) 8 MG tablet Take 1 tablet (8 mg total) by mouth 3 (three) times daily. 90 tablet 3  . pantoprazole (PROTONIX) 40 MG tablet Take 1 tablet (40 mg total) by mouth every evening. 90 tablet 1  . potassium chloride SA (K-DUR,KLOR-CON) 20 MEQ tablet DISSOLVE 4 TABLETS IN 4 TO 8 OUNCES OF WATER AND DRINK BY MOUTH ONCE ON DAY 1 THEN DISSOLVE 2 TABLETS AND DRINK ONCE DAILY FOR 10 DAYS 216 tablet 0  . potassium chloride SA (K-DUR,KLOR-CON) 20 MEQ tablet Take 2 tablets (40 mEq total) by mouth 2 (two) times daily. Take 2 tablets 2x a day for 5 days, then take 1 tablet 2x a day. 60 tablet 2  . pravastatin (PRAVACHOL) 20 MG tablet Take 1 tablet (20 mg total) by mouth daily. (Patient taking differently: Take 20 mg by mouth every evening. ) 90 tablet 1  . predniSONE (DELTASONE) 20 MG tablet TAKE 1 TABLET(20 MG) BY MOUTH DAILY WITH BREAKFAST (Patient taking differently: Take 10 mg by mouth daily  with breakfast. ) 90 tablet 0  . predniSONE (DELTASONE) 5 MG tablet Take 1 tablet (5 mg total) by mouth daily with breakfast. 90 tablet 2  . prochlorperazine (COMPAZINE) 10 MG tablet TAKE 1 TABLET(10 MG) BY MOUTH EVERY 6 HOURS AS NEEDED FOR NAUSEA OR VOMITING 385 tablet 1  . promethazine (PHENERGAN) 25 MG/ML injection promethazine 25 mg/mL injection solution  Take 1 mL by injection route for 1 day.    . tadalafil (CIALIS) 5 MG tablet Take 5 mg by mouth as needed.    . traZODone (DESYREL) 100 MG tablet TAKE 1 TABLET(100 MG) BY MOUTH AT BEDTIME AS NEEDED FOR SLEEP 30 tablet 2  . vitamin B-12 (CYANOCOBALAMIN) 1000 MCG tablet Take 1,000 mcg by mouth daily.      No facility-administered medications prior to visit.     ROS: Review of Systems  Constitutional: Negative for appetite change,  fatigue and unexpected weight change.  HENT: Negative for congestion, nosebleeds, sneezing, sore throat and trouble swallowing.   Eyes: Negative for itching and visual disturbance.  Respiratory: Negative for cough.   Cardiovascular: Positive for leg swelling. Negative for chest pain and palpitations.  Gastrointestinal: Positive for diarrhea. Negative for abdominal distention, blood in stool and nausea.  Genitourinary: Negative for frequency and hematuria.  Musculoskeletal: Negative for back pain, gait problem, joint swelling and neck pain.  Skin: Negative for rash.  Neurological: Negative for dizziness, tremors, speech difficulty and weakness.  Psychiatric/Behavioral: Negative for agitation, dysphoric mood and sleep disturbance. The patient is not nervous/anxious.     Objective:  BP 130/84 (BP Location: Left Arm, Patient Position: Sitting, Cuff Size: Normal)   Pulse 69   Temp 97.7 F (36.5 C) (Oral)   Ht 5' 6"  (1.676 m)   Wt 146 lb (66.2 kg)   SpO2 98%   BMI 23.57 kg/m   BP Readings from Last 3 Encounters:  03/27/18 130/84  03/25/18 139/75  03/23/18 104/68    Wt Readings from Last 3 Encounters:  03/27/18 146 lb (66.2 kg)  03/23/18 145 lb 1.9 oz (65.8 kg)  03/20/18 144 lb (65.3 kg)    Physical Exam Constitutional:      General: He is not in acute distress.    Appearance: He is well-developed.     Comments: NAD  Eyes:     Conjunctiva/sclera: Conjunctivae normal.     Pupils: Pupils are equal, round, and reactive to light.  Neck:     Musculoskeletal: Normal range of motion.     Thyroid: No thyromegaly.     Vascular: No JVD.  Cardiovascular:     Rate and Rhythm: Normal rate and regular rhythm.     Heart sounds: Normal heart sounds. No murmur. No friction rub. No gallop.   Pulmonary:     Effort: Pulmonary effort is normal. No respiratory distress.     Breath sounds: Normal breath sounds. No wheezing or rales.  Chest:     Chest wall: No tenderness.  Abdominal:      General: Bowel sounds are normal. There is no distension.     Palpations: Abdomen is soft. There is no mass.     Tenderness: There is no abdominal tenderness. There is no guarding or rebound.  Musculoskeletal: Normal range of motion.        General: Swelling present. No tenderness.  Lymphadenopathy:     Cervical: No cervical adenopathy.  Skin:    General: Skin is warm and dry.     Findings: No rash.  Neurological:  Mental Status: He is alert and oriented to person, place, and time.     Cranial Nerves: No cranial nerve deficit.     Motor: No abnormal muscle tone.     Coordination: Coordination normal.     Gait: Gait normal.     Deep Tendon Reflexes: Reflexes are normal and symmetric.  Psychiatric:        Behavior: Behavior normal.        Thought Content: Thought content normal.        Judgment: Judgment normal.   RLE 3+, LLE 2+ at ankles Chronic joint deformities  Lab Results  Component Value Date   WBC 8.3 03/25/2018   HGB 11.1 (L) 03/25/2018   HCT 34.7 (L) 03/25/2018   PLT 133 (L) 03/25/2018   GLUCOSE 89 03/25/2018   CHOL 128 03/10/2015   TRIG 95.0 03/10/2015   HDL 39.30 03/10/2015   LDLCALC 70 03/10/2015   ALT 14 03/25/2018   AST 11 (L) 03/25/2018   NA 140 03/25/2018   K 3.6 03/25/2018   CL 107 03/25/2018   CREATININE 0.72 03/25/2018   BUN 13 03/25/2018   CO2 28 03/25/2018   TSH 0.680 09/18/2017   PSA 1.95 10/15/2010   INR 1.1 (H) 06/14/2014   HGBA1C 6.0 09/28/2015    Ct Chest W Contrast  Result Date: 10/14/2017 CLINICAL DATA:  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 Restaging assessment. Weight loss. EXAM: CT CHEST, ABDOMEN, AND PELVIS WITH CONTRAST TECHNIQUE: Multidetector CT imaging of the chest, abdomen and pelvis was performed following the standard protocol during bolus administration of intravenous contrast. CONTRAST:  160m ISOVUE-300 IOPAMIDOL (ISOVUE-300) INJECTION 61% COMPARISON:  Multiple exams, including 03/07/2017 PET-CT  FINDINGS: CT CHEST FINDINGS Cardiovascular: Structures of the thoracic inlet obscured by streak artifact from the patient's bilateral proximal humeral/shoulder implants. Coronary, aortic arch, and branch vessel atherosclerotic vascular disease. Left Port-A-Cath tip: SVC. Mediastinum/Nodes: No pathologic thoracic adenopathy. Lungs/Pleura: Elevated right hemidiaphragm with atelectasis in the right middle lobe and right lower lobe. This appearance is similar to 03/07/2017. Musculoskeletal: Lower cervical and thoracic spondylosis and degenerative disc disease. CT ABDOMEN PELVIS FINDINGS Hepatobiliary: Unremarkable Pancreas: Unremarkable Spleen: Unremarkable Adrenals/Urinary Tract: Both adrenal glands appear normal. Bilateral renal cysts including a right parapelvic cyst. Urinary bladder unremarkable. Stomach/Bowel: Descending and sigmoid colon diverticulosis. Prominent stool throughout the colon favors constipation. Vascular/Lymphatic: Aortoiliac atherosclerotic vascular disease. No pathologic adenopathy is identified. Reproductive: Right greater than left scrotal hydroceles. Other: Small right supraumbilical subcutaneous nodule 1.8 by 1.0 cm, not readily apparent on 03/07/2017 but possibly obscured by motion artifact. This is most likely a sebaceous cyst or similar benign lesion, but technically nonspecific. Musculoskeletal: Degenerative hip arthropathy. Posterolateral rod and pedicle screw fixation bilaterally at all lumbar levels and in the sacrum with wide bony decompression from L1 through the upper sacrum. The sacral pedicle screws are fractured. Similarly, the right L4 pedicle screw head appears to be fractured away from the posterolateral rod. The left L1, right L3, and right L4 pedicle screws appear to capture the respective medial pedicle cortex. IMPRESSION: 1. No specific findings of active malignancy in the chest, abdomen, or pelvis. There is a subcutaneous nodule along the anterior abdominal wall just to  the right of midline in the supraumbilical region measuring 1.8 by 1.0 cm which merit surveillance. There is no hypermetabolic activity in this vicinity on the prior PET-CT of 03/07/2017. 2. Hardware failures involving the lumbosacral posterolateral rod and pedicle screw system, including fractured S1 pedicle screws, and  a fracture of the right L4 pedicle screw head. 3. Chronic elevation of the right hemidiaphragm with right basilar atelectasis. 4. Other imaging findings of potential clinical significance: Prominent stool throughout the colon favors constipation. Descending and sigmoid colon diverticulosis. Aortic Atherosclerosis (ICD10-I70.0). Scrotal hydroceles. Coronary atherosclerosis. Electronically Signed   By: Van Clines M.D.   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 Restaging assessment. Weight loss. EXAM: CT CHEST, ABDOMEN, AND PELVIS WITH CONTRAST TECHNIQUE: Multidetector CT imaging of the chest, abdomen and pelvis was performed following the standard protocol during bolus administration of intravenous contrast. CONTRAST:  117m ISOVUE-300 IOPAMIDOL (ISOVUE-300) INJECTION 61% COMPARISON:  Multiple exams, including 03/07/2017 PET-CT FINDINGS: CT CHEST FINDINGS Cardiovascular: Structures of the thoracic inlet obscured by streak artifact from the patient's bilateral proximal humeral/shoulder implants. Coronary, aortic arch, and branch vessel atherosclerotic vascular disease. Left Port-A-Cath tip: SVC. Mediastinum/Nodes: No pathologic thoracic adenopathy. Lungs/Pleura: Elevated right hemidiaphragm with atelectasis in the right middle lobe and right lower lobe. This appearance is similar to 03/07/2017. Musculoskeletal: Lower cervical and thoracic spondylosis and degenerative disc disease. CT ABDOMEN PELVIS FINDINGS Hepatobiliary: Unremarkable Pancreas: Unremarkable Spleen:  Unremarkable Adrenals/Urinary Tract: Both adrenal glands appear normal. Bilateral renal cysts including a right parapelvic cyst. Urinary bladder unremarkable. Stomach/Bowel: Descending and sigmoid colon diverticulosis. Prominent stool throughout the colon favors constipation. Vascular/Lymphatic: Aortoiliac atherosclerotic vascular disease. No pathologic adenopathy is identified. Reproductive: Right greater than left scrotal hydroceles. Other: Small right supraumbilical subcutaneous nodule 1.8 by 1.0 cm, not readily apparent on 03/07/2017 but possibly obscured by motion artifact. This is most likely a sebaceous cyst or similar benign lesion, but technically nonspecific. Musculoskeletal: Degenerative hip arthropathy. Posterolateral rod and pedicle screw fixation bilaterally at all lumbar levels and in the sacrum with wide bony decompression from L1 through the upper sacrum. The sacral pedicle screws are fractured. Similarly, the right L4 pedicle screw head appears to be fractured away from the posterolateral rod. The left L1, right L3, and right L4 pedicle screws appear to capture the respective medial pedicle cortex. IMPRESSION: 1. No specific findings of active malignancy in the chest, abdomen, or pelvis. There is a subcutaneous nodule along the anterior abdominal wall just to the right of midline in the supraumbilical region measuring 1.8 by 1.0 cm which merit surveillance. There is no hypermetabolic activity in this vicinity on the prior PET-CT of 03/07/2017. 2. Hardware failures involving the lumbosacral posterolateral rod and pedicle screw system, including fractured S1 pedicle screws, and a fracture of the right L4 pedicle screw head. 3. Chronic elevation of the right hemidiaphragm with right basilar atelectasis. 4. Other imaging findings of potential clinical significance: Prominent stool throughout the colon favors constipation. Descending and sigmoid colon diverticulosis. Aortic Atherosclerosis  (ICD10-I70.0). Scrotal hydroceles. Coronary atherosclerosis. Electronically Signed   By: WVan ClinesM.D.   On: 10/14/2017 14:16    Assessment & Plan:   There are no diagnoses linked to this encounter.   No orders of the defined types were placed in this encounter.    Follow-up: No follow-ups on file.  AWalker Kehr MD

## 2018-03-27 NOTE — Assessment & Plan Note (Signed)
Lopressor and Losartan HCT Labs

## 2018-03-27 NOTE — Assessment & Plan Note (Signed)
Lomotil prn

## 2018-03-27 NOTE — Assessment & Plan Note (Signed)
Better on Dilaudid  Potential benefits of a long term opioids use as well as potential risks (i.e. addiction risk, apnea etc) and complications (i.e. Somnolence, constipation and others) were explained to the patient and were aknowledged.

## 2018-03-27 NOTE — Assessment & Plan Note (Signed)
Demadex

## 2018-03-27 NOTE — Assessment & Plan Note (Signed)
KCl

## 2018-04-03 ENCOUNTER — Telehealth: Payer: Self-pay | Admitting: *Deleted

## 2018-04-03 ENCOUNTER — Encounter (HOSPITAL_COMMUNITY): Payer: Self-pay

## 2018-04-03 ENCOUNTER — Ambulatory Visit (HOSPITAL_COMMUNITY)
Admission: RE | Admit: 2018-04-03 | Discharge: 2018-04-03 | Disposition: A | Discharge status: Home / Self Care | Payer: Medicare Other | Source: Ambulatory Visit | Attending: Hematology & Oncology | Admitting: Hematology & Oncology

## 2018-04-03 ENCOUNTER — Other Ambulatory Visit: Payer: Medicare Other

## 2018-04-03 ENCOUNTER — Ambulatory Visit (HOSPITAL_COMMUNITY)
Admission: RE | Admit: 2018-04-03 | Discharge: 2018-04-03 | Disposition: A | Discharge status: Home / Self Care | Payer: Medicare Other | Source: Ambulatory Visit | Attending: Family | Admitting: Family

## 2018-04-03 DIAGNOSIS — R197 Diarrhea, unspecified: Secondary | ICD-10-CM | POA: Diagnosis not present

## 2018-04-03 DIAGNOSIS — C4372 Malignant melanoma of left lower limb, including hip: Secondary | ICD-10-CM | POA: Diagnosis not present

## 2018-04-03 DIAGNOSIS — Z0279 Encounter for issue of other medical certificate: Secondary | ICD-10-CM

## 2018-04-03 DIAGNOSIS — C4371 Malignant melanoma of right lower limb, including hip: Secondary | ICD-10-CM | POA: Diagnosis not present

## 2018-04-03 LAB — GLUCOSE, CAPILLARY: Glucose-Capillary: 87 mg/dL (ref 70–99)

## 2018-04-03 MED ORDER — FLUDEOXYGLUCOSE F - 18 (FDG) INJECTION
6.6000 | Freq: Once | INTRAVENOUS | Status: AC | PRN
  Administered 2018-04-03: 6.6 via INTRAVENOUS

## 2018-04-03 NOTE — Telephone Encounter (Signed)
-----   Message from [name], MD sent at 04/03/2018 12:19 PM EST ----- Call -- PET scan is NEGATIVE for any residual melanoma!!  [name]

## 2018-04-03 NOTE — Telephone Encounter (Signed)
As noted below by Dr. Marin Olp, I informed patient that the PET scan was negative for any residual melanoma. He verbalized understanding.

## 2018-04-04 ENCOUNTER — Encounter: Payer: Self-pay | Admitting: Internal Medicine

## 2018-04-05 ENCOUNTER — Other Ambulatory Visit: Payer: Self-pay | Admitting: Internal Medicine

## 2018-04-05 DIAGNOSIS — Z022 Encounter for examination for admission to residential institution: Secondary | ICD-10-CM

## 2018-04-05 LAB — CLOSTRIDIUM DIFFICILE EIA: C difficile Toxins A+B, EIA: NEGATIVE

## 2018-04-05 LAB — SPECIMEN STATUS REPORT

## 2018-04-13 DIAGNOSIS — R262 Difficulty in walking, not elsewhere classified: Secondary | ICD-10-CM | POA: Diagnosis not present

## 2018-04-13 DIAGNOSIS — M6281 Muscle weakness (generalized): Secondary | ICD-10-CM | POA: Diagnosis not present

## 2018-04-13 DIAGNOSIS — R2681 Unsteadiness on feet: Secondary | ICD-10-CM | POA: Diagnosis not present

## 2018-04-13 DIAGNOSIS — M25561 Pain in right knee: Secondary | ICD-10-CM | POA: Diagnosis not present

## 2018-04-13 DIAGNOSIS — R278 Other lack of coordination: Secondary | ICD-10-CM | POA: Diagnosis not present

## 2018-04-14 DIAGNOSIS — R262 Difficulty in walking, not elsewhere classified: Secondary | ICD-10-CM | POA: Diagnosis not present

## 2018-04-14 DIAGNOSIS — M6281 Muscle weakness (generalized): Secondary | ICD-10-CM | POA: Diagnosis not present

## 2018-04-14 DIAGNOSIS — R278 Other lack of coordination: Secondary | ICD-10-CM | POA: Diagnosis not present

## 2018-04-14 DIAGNOSIS — R2681 Unsteadiness on feet: Secondary | ICD-10-CM | POA: Diagnosis not present

## 2018-04-14 DIAGNOSIS — M25561 Pain in right knee: Secondary | ICD-10-CM | POA: Diagnosis not present

## 2018-04-15 DIAGNOSIS — M6281 Muscle weakness (generalized): Secondary | ICD-10-CM | POA: Diagnosis not present

## 2018-04-15 DIAGNOSIS — R278 Other lack of coordination: Secondary | ICD-10-CM | POA: Diagnosis not present

## 2018-04-15 DIAGNOSIS — R2681 Unsteadiness on feet: Secondary | ICD-10-CM | POA: Diagnosis not present

## 2018-04-15 DIAGNOSIS — M25561 Pain in right knee: Secondary | ICD-10-CM | POA: Diagnosis not present

## 2018-04-15 DIAGNOSIS — R262 Difficulty in walking, not elsewhere classified: Secondary | ICD-10-CM | POA: Diagnosis not present

## 2018-04-16 ENCOUNTER — Inpatient Hospital Stay: Payer: Medicare Other | Attending: Hematology & Oncology

## 2018-04-16 ENCOUNTER — Inpatient Hospital Stay: Payer: Medicare Other

## 2018-04-16 ENCOUNTER — Ambulatory Visit: Payer: Self-pay

## 2018-04-16 ENCOUNTER — Encounter: Payer: Self-pay | Admitting: Hematology & Oncology

## 2018-04-16 ENCOUNTER — Inpatient Hospital Stay (HOSPITAL_BASED_OUTPATIENT_CLINIC_OR_DEPARTMENT_OTHER): Payer: Medicare Other | Admitting: Hematology & Oncology

## 2018-04-16 ENCOUNTER — Other Ambulatory Visit: Payer: Self-pay

## 2018-04-16 VITALS — BP 90/44 | HR 71 | Temp 98.1°F | Resp 19 | Wt 139.0 lb

## 2018-04-16 VITALS — BP 98/51 | HR 64

## 2018-04-16 DIAGNOSIS — Z95828 Presence of other vascular implants and grafts: Secondary | ICD-10-CM | POA: Insufficient documentation

## 2018-04-16 DIAGNOSIS — C4372 Malignant melanoma of left lower limb, including hip: Secondary | ICD-10-CM

## 2018-04-16 DIAGNOSIS — M7989 Other specified soft tissue disorders: Secondary | ICD-10-CM

## 2018-04-16 DIAGNOSIS — D500 Iron deficiency anemia secondary to blood loss (chronic): Secondary | ICD-10-CM

## 2018-04-16 LAB — CBC WITH DIFFERENTIAL (CANCER CENTER ONLY)
Abs Immature Granulocytes: 0.05 10*3/uL (ref 0.00–0.07)
Basophils Absolute: 0 10*3/uL (ref 0.0–0.1)
Basophils Relative: 0 %
Eosinophils Absolute: 0 10*3/uL (ref 0.0–0.5)
Eosinophils Relative: 0 %
HCT: 33.2 % — ABNORMAL LOW (ref 39.0–52.0)
Hemoglobin: 11.4 g/dL — ABNORMAL LOW (ref 13.0–17.0)
Immature Granulocytes: 1 %
Lymphocytes Relative: 27 %
Lymphs Abs: 1.5 10*3/uL (ref 0.7–4.0)
MCH: 33.5 pg (ref 26.0–34.0)
MCHC: 34.3 g/dL (ref 30.0–36.0)
MCV: 97.6 fL (ref 80.0–100.0)
Monocytes Absolute: 0.8 10*3/uL (ref 0.1–1.0)
Monocytes Relative: 15 %
Neutro Abs: 3.2 10*3/uL (ref 1.7–7.7)
Neutrophils Relative %: 57 %
Platelet Count: 192 10*3/uL (ref 150–400)
RBC: 3.4 MIL/uL — ABNORMAL LOW (ref 4.22–5.81)
RDW: 12.3 % (ref 11.5–15.5)
WBC Count: 5.7 10*3/uL (ref 4.0–10.5)
nRBC: 0 % (ref 0.0–0.2)

## 2018-04-16 LAB — CMP (CANCER CENTER ONLY)
ALT: 10 U/L (ref 0–44)
AST: 7 U/L — ABNORMAL LOW (ref 15–41)
Albumin: 3.3 g/dL — ABNORMAL LOW (ref 3.5–5.0)
Alkaline Phosphatase: 55 U/L (ref 38–126)
Anion gap: 6 (ref 5–15)
BUN: 45 mg/dL — ABNORMAL HIGH (ref 8–23)
CO2: 32 mmol/L (ref 22–32)
Calcium: 8.1 mg/dL — ABNORMAL LOW (ref 8.9–10.3)
Chloride: 89 mmol/L — ABNORMAL LOW (ref 98–111)
Creatinine: 1.48 mg/dL — ABNORMAL HIGH (ref 0.61–1.24)
GFR, Est AFR Am: 55 mL/min — ABNORMAL LOW (ref >60)
GFR, Estimated: 47 mL/min — ABNORMAL LOW (ref >60)
Glucose, Bld: 117 mg/dL — ABNORMAL HIGH (ref 70–99)
Potassium: 3.5 mmol/L (ref 3.5–5.1)
Sodium: 127 mmol/L — ABNORMAL LOW (ref 135–145)
Total Bilirubin: 0.6 mg/dL (ref 0.3–1.2)
Total Protein: 5.7 g/dL — ABNORMAL LOW (ref 6.5–8.1)

## 2018-04-16 MED ORDER — SODIUM CHLORIDE 0.9 % IV SOLN
INTRAVENOUS | Status: DC
  Administered 2018-04-16: 09:00:00 via INTRAVENOUS
  Filled 2018-04-16 (×2): qty 250

## 2018-04-16 MED ORDER — HEPARIN SOD (PORK) LOCK FLUSH 100 UNIT/ML IV SOLN
500.0000 [IU] | Freq: Once | INTRAVENOUS | Status: AC
  Administered 2018-04-16: 500 [IU] via INTRAVENOUS
  Filled 2018-04-16: qty 5

## 2018-04-16 MED ORDER — SODIUM CHLORIDE 0.9% FLUSH
10.0000 mL | INTRAVENOUS | Status: DC | PRN
  Administered 2018-04-16: 10 mL via INTRAVENOUS
  Filled 2018-04-16: qty 10

## 2018-04-16 MED ORDER — SODIUM CHLORIDE 0.9 % IV SOLN
INTRAVENOUS | Status: DC
  Administered 2018-04-16: 10:00:00 via INTRAVENOUS
  Filled 2018-04-16 (×2): qty 250

## 2018-04-16 NOTE — Addendum Note (Signed)
Addended by: Burney Gauze R on: 04/16/2018 09:42 AM   Modules accepted: Orders

## 2018-04-16 NOTE — Telephone Encounter (Signed)
Pt. Reports he saw Dr. Jonette Eva this morning and he had low BP readings. Reports "they gave me some saline infusions." BP readings per pt. - 37/62,83/15,17/61. Reports he feels " a little lightheaded." Reports he "fainted" last week at a restaurant. States he stood up and immediately "went out."  States "I feel like I need ti stop my BP meds." Will forward triage and [place] will send to Dr. Alain Marion. Contact number [phone].Please advise pt.  Reason for Disposition . [6] Systolic BP [id number] AND [0] taking blood pressure medications AND [3] NOT dizzy, lightheaded or weak  Answer Assessment - Initial Assessment Questions 1. BLOOD PRESSURE: "What is the blood pressure?" "Did you take at least two measurements 5 minutes apart?"     90/44   99/54 2. ONSET: "When did you take your blood pressure?"      0800 Dr. Loreli Slot office 3. HOW: "How did you obtain the blood pressure?" (e.g., visiting nurse, automatic home BP monitor)     Doctor's office 4. HISTORY: "Do you have a history of low blood pressure?" "What is your blood pressure normally?"     No 5. MEDICATIONS: "Are you taking any medications for blood pressure?" If yes: "Have they been changed recently?"     Yes 6. PULSE RATE: "Do you know what your pulse rate is?"      Unsure 7. OTHER SYMPTOMS: "Have you been sick recently?" "Have you had a recent injury?"     No 8. PREGNANCY: "Is there any chance you are pregnant?" "When was your last menstrual period?"     n/a  Protocols used: LOW BLOOD PRESSURE-A-AH

## 2018-04-16 NOTE — Progress Notes (Signed)
BP at end of second 500 ml of NS 98/51.  Dr. Marin Olp notified and order received for pt to call his PCP regarding BP.  Pt instructed per order of Dr. Marin Olp to hold Hyzaar until he speaks with PCP.  Pt verbalizes an understanding of MD orders.

## 2018-04-16 NOTE — Patient Instructions (Signed)
Implanted Port Insertion, Care After  This sheet gives you information about how to care for yourself after your procedure. Your health care provider may also give you more specific instructions. If you have problems or questions, contact your health care provider.  What can I expect after the procedure?  After the procedure, it is common to have:  · Discomfort at the port insertion site.  · Bruising on the skin over the port. This should improve over 3-4 days.  Follow these instructions at home:  Port care  · After your port is placed, you will get a manufacturer's information card. The card has information about your port. Keep this card with you at all times.  · Take care of the port as told by your health care provider. Ask your health care provider if you or a family member can get training for taking care of the port at home. A home health care nurse may also take care of the port.  · Make sure to remember what type of port you have.  Incision care         · Follow instructions from your health care provider about how to take care of your port insertion site. Make sure you:  ? Wash your hands with soap and water before and after you change your bandage (dressing). If soap and water are not available, use hand sanitizer.  ? Change your dressing as told by your health care provider.  ? Leave stitches (sutures), skin glue, or adhesive strips in place. These skin closures may need to stay in place for 2 weeks or longer. If adhesive strip edges start to loosen and curl up, you may trim the loose edges. Do not remove adhesive strips completely unless your health care provider tells you to do that.  · Check your port insertion site every day for signs of infection. Check for:  ? Redness, swelling, or pain.  ? Fluid or blood.  ? Warmth.  ? Pus or a bad smell.  Activity  · Return to your normal activities as told by your health care provider. Ask your health care provider what activities are safe for you.  · Do not  lift anything that is heavier than 10 lb (4.5 kg), or the limit that you are told, until your health care provider says that it is safe.  General instructions  · Take over-the-counter and prescription medicines only as told by your health care provider.  · Do not take baths, swim, or use a hot tub until your health care provider approves. Ask your health care provider if you may take showers. You may only be allowed to take sponge baths.  · Do not drive for 24 hours if you were given a sedative during your procedure.  · Wear a medical alert bracelet in case of an emergency. This will tell any health care providers that you have a port.  · Keep all follow-up visits as told by your health care provider. This is important.  Contact a health care provider if:  · You cannot flush your port with saline as directed, or you cannot draw blood from the port.  · You have a fever or chills.  · You have redness, swelling, or pain around your port insertion site.  · You have fluid or blood coming from your port insertion site.  · Your port insertion site feels warm to the touch.  · You have pus or a bad smell coming from the port   insertion site.  Get help right away if:  · You have chest pain or shortness of breath.  · You have bleeding from your port that you cannot control.  Summary  · Take care of the port as told by your health care provider. Keep the manufacturer's information card with you at all times.  · Change your dressing as told by your health care provider.  · Contact a health care provider if you have a fever or chills or if you have redness, swelling, or pain around your port insertion site.  · Keep all follow-up visits as told by your health care provider.  This information is not intended to replace advice given to you by your health care provider. Make sure you discuss any questions you have with your health care provider.  Document Released: 01/13/2013 Document Revised: 10/21/2017 Document Reviewed:  10/21/2017  [employer] Interactive Patient Education © 2019 [employer]

## 2018-04-16 NOTE — Telephone Encounter (Signed)
Please advise 

## 2018-04-16 NOTE — Patient Instructions (Signed)
Dehydration, Adult  Dehydration is a condition in which there is not enough fluid or water in the body. This happens when you lose more fluids than you take in. Important organs, such as the kidneys, brain, and heart, cannot function without a proper amount of fluids. Any loss of fluids from the body can lead to dehydration. Dehydration can range from mild to severe. This condition should be treated right away to prevent it from becoming severe. What are the causes? This condition may be caused by:  Vomiting.  Diarrhea.  Excessive sweating, such as from heat exposure or exercise.  Not drinking enough fluid, especially: ? When ill. ? While doing activity that requires a lot of energy.  Excessive urination.  Fever.  Infection.  Certain medicines, such as medicines that cause the body to lose excess fluid (diuretics).  Inability to access safe drinking water.  Reduced physical ability to get adequate water and food. What increases the risk? This condition is more likely to develop in people:  Who have a poorly controlled long-term (chronic) illness, such as diabetes, heart disease, or kidney disease.  Who are age 85 or older.  Who are disabled.  Who live in a place with high altitude.  Who play endurance sports. What are the signs or symptoms? Symptoms of mild dehydration may include:  Thirst.  Dry lips.  Slightly dry mouth.  Dry, warm skin.  Dizziness. Symptoms of moderate dehydration may include:  Very dry mouth.  Muscle cramps.  Dark urine. Urine may be the color of tea.  Decreased urine production.  Decreased tear production.  Heartbeat that is irregular or faster than normal (palpitations).  Headache.  Light-headedness, especially when you stand up from a sitting position.  Fainting (syncope). Symptoms of severe dehydration may include:  Changes in skin, such as: ? Cold and clammy skin. ? Blotchy (mottled) or pale skin. ? Skin that does  not quickly return to normal after being lightly pinched and released (poor skin turgor).  Changes in body fluids, such as: ? Extreme thirst. ? No tear production. ? Inability to sweat when body temperature is high, such as in hot weather. ? Very little urine production.  Changes in vital signs, such as: ? Weak pulse. ? Pulse that is more than 100 beats a minute when sitting still. ? Rapid breathing. ? Low blood pressure.  Other changes, such as: ? Sunken eyes. ? Cold hands and feet. ? Confusion. ? Lack of energy (lethargy). ? Difficulty waking up from sleep. ? Short-term weight loss. ? Unconsciousness. How is this diagnosed? This condition is diagnosed based on your symptoms and a physical exam. Blood and urine tests may be done to help confirm the diagnosis. How is this treated? Treatment for this condition depends on the severity. Mild or moderate dehydration can often be treated at home. Treatment should be started right away. Do not wait until dehydration becomes severe. Severe dehydration is an emergency and it needs to be treated in a hospital. Treatment for mild dehydration may include:  Drinking more fluids.  Replacing salts and minerals in your blood (electrolytes) that you may have lost. Treatment for moderate dehydration may include:  Drinking an oral rehydration solution (ORS). This is a drink that helps you replace fluids and electrolytes (rehydrate). It can be found at pharmacies and retail stores. Treatment for severe dehydration may include:  Receiving fluids through an IV tube.  Receiving an electrolyte solution through a feeding tube that is passed through your nose and  into your stomach (nasogastric tube, or NG tube).  Correcting any abnormalities in electrolytes.  Treating the underlying cause of dehydration. Follow these instructions at home:  If directed by your health care provider, drink an ORS: ? Make an ORS by following instructions on the  package. ? Start by drinking small amounts, about  cup (120 mL) every 5-10 minutes. ? Slowly increase how much you drink until you have taken the amount recommended by your health care provider.  Drink enough clear fluid to keep your urine clear or pale yellow. If you were told to drink an ORS, finish the ORS first, then start slowly drinking other clear fluids. Drink fluids such as: ? Water. Do not drink only water. Doing that can lead to having too little salt (sodium) in the body (hyponatremia). ? Ice chips. ? Fruit juice that you have added water to (diluted fruit juice). ? Low-calorie sports drinks.  Avoid: ? Alcohol. ? Drinks that contain a lot of sugar. These include high-calorie sports drinks, fruit juice that is not diluted, and soda. ? Caffeine. ? Foods that are greasy or contain a lot of fat or sugar.  Take over-the-counter and prescription medicines only as told by your health care provider.  Do not take sodium tablets. This can lead to having too much sodium in the body (hypernatremia).  Eat foods that contain a healthy balance of electrolytes, such as bananas, oranges, potatoes, tomatoes, and spinach.  Keep all follow-up visits as told by your health care provider. This is important. Contact a health care provider if:  You have abdominal pain that: ? Gets worse. ? Stays in one area (localizes).  You have a rash.  You have a stiff neck.  You are more irritable than usual.  You are sleepier or more difficult to wake up than usual.  You feel weak or dizzy.  You feel very thirsty.  You have urinated only a small amount of very dark urine over 6-8 hours. Get help right away if:  You have symptoms of severe dehydration.  You cannot drink fluids without vomiting.  Your symptoms get worse with treatment.  You have a fever.  You have a severe headache.  You have vomiting or diarrhea that: ? Gets worse. ? Does not go away.  You have blood or green matter  (bile) in your vomit.  You have blood in your stool. This may cause stool to look black and tarry.  You have not urinated in 6-8 hours.  You faint.  Your heart rate while sitting still is over 100 beats a minute.  You have trouble breathing. This information is not intended to replace advice given to you by your health care provider. Make sure you discuss any questions you have with your health care provider. Document Released: 03/25/2005 Document Revised: 10/20/2015 Document Reviewed: 05/19/2015 Elsevier Interactive Patient Education  2019 [employer].

## 2018-04-16 NOTE — Progress Notes (Signed)
Hematology and Oncology Follow Up Visit  [name] [id number] Dec 24, 1945 73 y.o. 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  Finally, the diarrhea has stopped.  The Remicade that we gave him took care of the diarrhea.  After 3 days from the Remicade, he had no further diarrhea.  He was so happy.  He now has a life that he can enjoy.  He and his wife are now in the assisted living.  There are enjoying this.  All other meals are made.  They actually have eaten quite a lot.  He has had no problems with pain.  He is doing physical therapy and occupational therapy at the assisted living facility.  He does have the Port-A-Cath in.  He will have this flushed today.  His iron studies that we did on him back in December showed a ferritin of 101 with an iron saturation of 85%.  I think a lot of the iron elevation was because of the diarrhea.  He has had no issues with fever.  He has had no cough.  His leg swelling is much better now.  He is on some diuretics.  Overall, I would say his performance status is ECOG 1.  Medications:  Allergies as of 04/16/2018      Reactions   Cefepime Hives, Shortness Of Breath   Cephaeline Hives   Morphine And Related Shortness Of Breath, Nausea And Vomiting, Swelling, Other (See Comments)   Agitation, tolerates dilaudid Other reaction(s): Other (See Comments) Agitation, tolerates dilaudid   Penicillins Hives, Shortness Of Breath, Rash   Has patient had a PCN reaction causing immediate rash, facial/tongue/throat swelling, SOB or lightheadedness with hypotension: Yes Has patient had a PCN  reaction causing severe rash involving mucus membranes or skin necrosis: Yes Has patient had a PCN reaction that required hospitalization No Has patient had a PCN reaction occurring within the last 10 years: No If all of the above answers are "NO", then may proceed with Cephalosporin use.   Morphine    Oxycodone-acetaminophen    Doxycycline Rash      Medication List       Accurate as of April 16, 2018  8:26 AM. Always use your most recent med list.        albuterol 108 (90 Base) MCG/ACT inhaler Commonly known as:  PROVENTIL HFA;VENTOLIN HFA Inhale 1-2 puffs into the lungs every 4 (four) hours as needed for wheezing or shortness of breath.   alfuzosin 10 MG 24 hr tablet Commonly known as:  UROXATRAL Take 10 mg by mouth at bedtime.   ALPRAZolam 0.5 MG tablet Commonly known as:  XANAX Take 1 tablet (0.5 mg total) by mouth 2 (two) times daily as needed. for anxiety   CIALIS 5 MG tablet Generic drug:  tadalafil Take 5 mg by mouth as needed.   clopidogrel 75 MG tablet Commonly known as:  PLAVIX TAKE 1 TABLET BY MOUTH EVERY DAY WITH BREAKFAST   diphenoxylate-atropine 2.5-0.025 MG tablet Commonly known as:  LOMOTIL Take 1 tablet by mouth 4 (four) times daily as needed for diarrhea or loose stools.   dronabinol 5 MG capsule Commonly known as:  MARINOL Take 1 capsule (5 mg total) by mouth 2 (two) times daily before a meal.   FISH OIL OMEGA-3 1000 MG Caps Take 1,000 mg by mouth daily.   FLUoxetine 40 MG capsule Commonly known as:  PROZAC Take 1 capsule (40 mg total) by mouth 2 (two) times daily.   fluticasone 50 MCG/ACT nasal spray Commonly known as:  FLONASE INSTILL 2 SPRAYS IN EACH NOSTRIL EVERY DAY   gabapentin 600 MG tablet Commonly known as:  NEURONTIN gabapentin 600 mg tablet   gabapentin 300 MG capsule Commonly known as:  NEURONTIN Take 1 capsule (300 mg total) by mouth 2 (two) times daily.   HYDROmorphone 2 MG tablet Commonly known as:  DILAUDID Take 1  tablet (2 mg total) by mouth every 8 (eight) hours as needed for severe pain. Please fill on or after 05/15/18   lidocaine-prilocaine cream Commonly known as:  EMLA Apply 1 application topically as needed.   losartan-hydrochlorothiazide 50-12.5 MG tablet Commonly known as:  HYZAAR Take 1 tablet by mouth daily.   Melatonin 5 MG Tabs Take 5 mg by mouth at bedtime. Takes 10 mg at HS   metoprolol tartrate 25 MG tablet Commonly known as:  LOPRESSOR Take 1 tablet (25 mg total) by mouth 2 (two) times daily.   mupirocin ointment 2 % Commonly known as:  BACTROBAN APPLY AA OF THE SKIN QD   ondansetron 8 MG tablet Commonly known as:  ZOFRAN Take 1 tablet (8 mg total) by mouth 3 (three) times daily.   pantoprazole 40 MG tablet Commonly known as:  PROTONIX Take 1 tablet (40 mg total) by mouth every evening.   potassium chloride SA 20 MEQ tablet Commonly known as:  K-DUR,KLOR-CON DISSOLVE 4 TABLETS IN 4 TO 8 OUNCES OF WATER AND DRINK BY MOUTH ONCE ON DAY 1 THEN DISSOLVE 2 TABLETS AND DRINK ONCE DAILY FOR 10 DAYS   potassium chloride SA 20 MEQ tablet Commonly known as:  K-DUR,KLOR-CON Take 2 tablets (40 mEq total) by mouth 2 (two) times daily. Take 2 tablets 2x a day for 5 days, then take 1 tablet 2x a day.   pravastatin 20 MG tablet Commonly known as:  PRAVACHOL Take 1 tablet (20 mg total) by mouth daily.   predniSONE 5 MG tablet Commonly known as:  DELTASONE Take 1 tablet (5 mg total) by mouth daily with breakfast.   predniSONE 20 MG tablet Commonly known as:  DELTASONE TAKE 1 TABLET(20 MG) BY MOUTH DAILY WITH BREAKFAST   prochlorperazine 10 MG tablet Commonly known as:  COMPAZINE TAKE 1 TABLET(10 MG) BY MOUTH EVERY 6 HOURS AS NEEDED FOR NAUSEA OR VOMITING   promethazine 25 MG/ML injection Commonly known as:  PHENERGAN promethazine 25 mg/mL injection solution  Take 1 mL by injection route for 1 day.   torsemide 10 MG tablet Commonly known as:  DEMADEX Take 1-2 tablets  (10-20 mg total) by mouth daily.   traZODone 100 MG tablet Commonly known as:  DESYREL TAKE 1 TABLET(100 MG) BY MOUTH AT BEDTIME AS NEEDED FOR SLEEP   vitamin B-12 1000 MCG tablet Commonly known as:  CYANOCOBALAMIN Take 1,000 mcg by mouth daily.   Vitamin D3 50 MCG (2000 UT) capsule Take 1 capsule (2,000 Units total) by mouth daily.       Allergies:  Allergies  Allergen Reactions  . Cefepime Hives and Shortness Of Breath  . Cephaeline Hives  . Morphine And Related Shortness Of Breath, Nausea And Vomiting, Swelling and Other (See Comments)  Agitation, tolerates dilaudid Other reaction(s): Other (See Comments) Agitation, tolerates dilaudid  . Penicillins Hives, Shortness Of Breath and Rash    Has patient had a PCN reaction causing immediate rash, facial/tongue/throat swelling, SOB or lightheadedness with hypotension: Yes Has patient had a PCN reaction causing severe rash involving mucus membranes or skin necrosis: Yes Has patient had a PCN reaction that required hospitalization No Has patient had a PCN reaction occurring within the last 10 years: No If all of the above answers are "NO", then may proceed with Cephalosporin use.   [name] Morphine   . Oxycodone-Acetaminophen   . Doxycycline Rash    Past Medical History, Surgical history, Social history, and Family History were reviewed and updated.  Review of Systems: Review of Systems  Constitutional: Positive for malaise/fatigue.  HENT: Negative.   Eyes: Negative.   Respiratory: Negative.   Cardiovascular: Negative.   Gastrointestinal: Positive for diarrhea.  Genitourinary: Negative.   Musculoskeletal: Positive for joint pain and neck pain.  Skin: Negative.   Neurological: Negative.   Endo/Heme/Allergies: Negative.       Physical Exam:  weight is 139 lb (63 kg). His oral temperature is 98.1 F (36.7 C). His blood pressure is 90/44 (abnormal) and his pulse is 71. His respiration is 19 and oxygen saturation is 95%.    Wt Readings from Last 3 Encounters:  04/16/18 139 lb (63 kg)  03/27/18 146 lb (66.2 kg)  03/23/18 145 lb 1.9 oz (65.8 kg)    Physical Exam Vitals signs reviewed.  HENT:     Head: Normocephalic and atraumatic.  Eyes:     Pupils: Pupils are equal, round, and reactive to light.  Neck:     Musculoskeletal: Normal range of motion.  Cardiovascular:     Rate and Rhythm: Normal rate and regular rhythm.     Heart sounds: Normal heart sounds.  Pulmonary:     Effort: Pulmonary effort is normal.     Breath sounds: Normal breath sounds.  Abdominal:     General: Bowel sounds are normal.     Palpations: Abdomen is soft.  Musculoskeletal: Normal range of motion.        General: No tenderness or deformity.  Lymphadenopathy:     Cervical: No cervical adenopathy.  Skin:    General: Skin is warm and dry.     Findings: No erythema or rash.  Neurological:     Mental Status: He is alert and oriented to person, place, and time.  Psychiatric:        Behavior: Behavior normal.        Thought Content: Thought content normal.        Judgment: Judgment normal.      Lab Results  Component Value Date   WBC 8.3 03/25/2018   HGB 11.1 (L) 03/25/2018   HCT 34.7 (L) 03/25/2018   MCV 103.3 (H) 03/25/2018   PLT 133 (L) 03/25/2018   Lab Results  Component Value Date   FERRITIN 101 03/20/2018   IRON 136 03/20/2018   TIBC 160 (L) 03/20/2018   UIBC 25 (L) 03/20/2018   IRONPCTSAT 85 (H) 03/20/2018   Lab Results  Component Value Date   RETICCTPCT 1.9 03/24/2013   RBC 3.36 (L) 03/25/2018   No results found for: KPAFRELGTCHN, LAMBDASER, KAPLAMBRATIO No results found for: IGGSERUM, IGA, IGMSERUM No results found for: Ronnald Ramp, A1GS, A2GS, BETS, BETA2SER, GAMS, MSPIKE, SPEI   Chemistry      Component Value Date/Time   NA 140 03/25/2018 0954  NA 140 04/10/2017 1046   NA 135 (L) 03/06/2017 1104   K 3.6 03/25/2018 0954   K 3.7 04/10/2017 1046   K 4.2 03/06/2017 1104   CL  107 03/25/2018 0954   CL 98 04/10/2017 1046   CO2 28 03/25/2018 0954   CO2 31 04/10/2017 1046   CO2 30 (H) 03/06/2017 1104   BUN 13 03/25/2018 0954   BUN 21 04/10/2017 1046   BUN 20.0 03/06/2017 1104   CREATININE 0.72 03/25/2018 0954   CREATININE 0.9 04/10/2017 1046   CREATININE 1.0 03/06/2017 1104      Component Value Date/Time   CALCIUM 7.0 (L) 03/25/2018 0954   CALCIUM 8.9 04/10/2017 1046   CALCIUM 9.4 03/06/2017 1104   ALKPHOS 49 03/25/2018 0954   ALKPHOS 65 04/10/2017 1046   ALKPHOS 78 03/06/2017 1104   AST 11 (L) 03/25/2018 0954   AST 20 03/06/2017 1104   ALT 14 03/25/2018 0954   ALT 21 04/10/2017 1046   ALT 20 03/06/2017 1104   BILITOT 0.8 03/25/2018 0954   BILITOT 0.47 03/06/2017 1104       Impression and Plan: Mr. Cavitt is a very pleasant 73 yo caucasian gentleman with history of hemochromatosis, homozygous for the C282Y mutation which has not been a problem for him.   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   I am so happy that the diarrhea is no longer with him.  His life is so much better now.  He did have a PET scan done in December.  Thankfully, the PET scan did not show any evidence of recurrent or metastatic melanoma.  We will go ahead and plan to get him back in 2 months now.  We will start to spread his appointments out further.    [name], MD 1/9/20208:26 AM

## 2018-04-16 NOTE — Progress Notes (Signed)
At the end of 500 ml of NS, BP-99/54.  Dr. Marin Olp notified and order received to give pt additional 500 ml NS over one hour.

## 2018-04-17 DIAGNOSIS — R262 Difficulty in walking, not elsewhere classified: Secondary | ICD-10-CM | POA: Diagnosis not present

## 2018-04-17 DIAGNOSIS — M6281 Muscle weakness (generalized): Secondary | ICD-10-CM | POA: Diagnosis not present

## 2018-04-17 DIAGNOSIS — R278 Other lack of coordination: Secondary | ICD-10-CM | POA: Diagnosis not present

## 2018-04-17 DIAGNOSIS — R2681 Unsteadiness on feet: Secondary | ICD-10-CM | POA: Diagnosis not present

## 2018-04-17 DIAGNOSIS — M25561 Pain in right knee: Secondary | ICD-10-CM | POA: Diagnosis not present

## 2018-04-17 LAB — IRON AND TIBC
Iron: 102 ug/dL (ref 42–163)
Saturation Ratios: 61 % — ABNORMAL HIGH (ref 20–55)
TIBC: 169 ug/dL — ABNORMAL LOW (ref 202–409)
UIBC: 67 ug/dL — ABNORMAL LOW (ref 117–376)

## 2018-04-17 LAB — FERRITIN: Ferritin: 255 ng/mL (ref 24–336)

## 2018-04-17 NOTE — Telephone Encounter (Signed)
LMTCB

## 2018-04-17 NOTE — Telephone Encounter (Signed)
Pt notified and will wait till his appointment on 04/21/2018

## 2018-04-17 NOTE — Telephone Encounter (Signed)
pls hold losartan HCT and Torsemide OV w/any provider - I could see him on Sat tomorrow Thx

## 2018-04-20 DIAGNOSIS — H35033 Hypertensive retinopathy, bilateral: Secondary | ICD-10-CM | POA: Diagnosis not present

## 2018-04-20 DIAGNOSIS — R278 Other lack of coordination: Secondary | ICD-10-CM | POA: Diagnosis not present

## 2018-04-20 DIAGNOSIS — R2681 Unsteadiness on feet: Secondary | ICD-10-CM | POA: Diagnosis not present

## 2018-04-20 DIAGNOSIS — M6281 Muscle weakness (generalized): Secondary | ICD-10-CM | POA: Diagnosis not present

## 2018-04-20 DIAGNOSIS — R262 Difficulty in walking, not elsewhere classified: Secondary | ICD-10-CM | POA: Diagnosis not present

## 2018-04-20 DIAGNOSIS — Z961 Presence of intraocular lens: Secondary | ICD-10-CM | POA: Diagnosis not present

## 2018-04-20 DIAGNOSIS — D3132 Benign neoplasm of left choroid: Secondary | ICD-10-CM | POA: Diagnosis not present

## 2018-04-20 DIAGNOSIS — M25561 Pain in right knee: Secondary | ICD-10-CM | POA: Diagnosis not present

## 2018-04-20 DIAGNOSIS — H3581 Retinal edema: Secondary | ICD-10-CM | POA: Diagnosis not present

## 2018-04-21 ENCOUNTER — Encounter: Payer: Self-pay | Admitting: Internal Medicine

## 2018-04-21 ENCOUNTER — Ambulatory Visit (INDEPENDENT_AMBULATORY_CARE_PROVIDER_SITE_OTHER): Payer: Medicare Other | Admitting: Internal Medicine

## 2018-04-21 DIAGNOSIS — E43 Unspecified severe protein-calorie malnutrition: Secondary | ICD-10-CM | POA: Diagnosis not present

## 2018-04-21 DIAGNOSIS — M25561 Pain in right knee: Secondary | ICD-10-CM | POA: Diagnosis not present

## 2018-04-21 DIAGNOSIS — R2681 Unsteadiness on feet: Secondary | ICD-10-CM | POA: Diagnosis not present

## 2018-04-21 DIAGNOSIS — G894 Chronic pain syndrome: Secondary | ICD-10-CM

## 2018-04-21 DIAGNOSIS — R609 Edema, unspecified: Secondary | ICD-10-CM

## 2018-04-21 DIAGNOSIS — I10 Essential (primary) hypertension: Secondary | ICD-10-CM

## 2018-04-21 DIAGNOSIS — L84 Corns and callosities: Secondary | ICD-10-CM

## 2018-04-21 DIAGNOSIS — R278 Other lack of coordination: Secondary | ICD-10-CM | POA: Diagnosis not present

## 2018-04-21 DIAGNOSIS — M05711 Rheumatoid arthritis with rheumatoid factor of right shoulder without organ or systems involvement: Secondary | ICD-10-CM

## 2018-04-21 DIAGNOSIS — R262 Difficulty in walking, not elsewhere classified: Secondary | ICD-10-CM | POA: Diagnosis not present

## 2018-04-21 DIAGNOSIS — M6281 Muscle weakness (generalized): Secondary | ICD-10-CM | POA: Diagnosis not present

## 2018-04-21 MED ORDER — METOPROLOL TARTRATE 25 MG PO TABS: 12.5000 mg | ORAL_TABLET | Freq: Two times a day (BID) | ORAL | 1 refills | Status: DC

## 2018-04-21 MED ORDER — PREDNISONE 5 MG PO TABS: 10.0000 mg | ORAL_TABLET | Freq: Every day | ORAL | 2 refills | Status: DC

## 2018-04-21 NOTE — Assessment & Plan Note (Signed)
Dilaudid prn

## 2018-04-21 NOTE — Assessment & Plan Note (Signed)
?  trekking poles for walking

## 2018-04-21 NOTE — Assessment & Plan Note (Signed)
Try GLYTONE exfoliating body lotion by Desmond Dike (free acid value 17.5) on the foot callus twice a day - buy a small bottle

## 2018-04-21 NOTE — Assessment & Plan Note (Signed)
Demadex prn

## 2018-04-21 NOTE — Progress Notes (Signed)
Subjective:  Patient ID: [name], male    DOB: [birth year]  Age: 73 y.o. MRN: [id number]  CC: No chief complaint on file.   HPI LYRIQ JARCHOW presents for diarrhea - stopped F/u chronic pain, LE edema f/u C/o low BP  BP 96/62 (BP Location: Left Arm, Patient Position: Sitting, Cuff Size: Normal)   Pulse 77   Temp 98 F (36.7 C) (Oral)   Ht 5' 6"  (1.676 m)   Wt 142 lb (64.4 kg)   SpO2 95%   BMI 22.92 kg/m     Outpatient Medications Prior to Visit  Medication Sig Dispense Refill  . albuterol (PROVENTIL HFA;VENTOLIN HFA) 108 (90 Base) MCG/ACT inhaler Inhale 1-2 puffs into the lungs every 4 (four) hours as needed for wheezing or shortness of breath. 1 Inhaler 1  . alfuzosin (UROXATRAL) 10 MG 24 hr tablet Take 10 mg by mouth at bedtime.   0  . ALPRAZolam (XANAX) 0.5 MG tablet Take 1 tablet (0.5 mg total) by mouth 2 (two) times daily as needed. for anxiety 60 tablet 3  . Cholecalciferol (VITAMIN D3) 2000 UNITS capsule Take 1 capsule (2,000 Units total) by mouth daily. 100 capsule 3  . clopidogrel (PLAVIX) 75 MG tablet TAKE 1 TABLET BY MOUTH EVERY DAY WITH BREAKFAST 90 tablet 3  . diphenoxylate-atropine (LOMOTIL) 2.5-0.025 MG tablet Take 1 tablet by mouth 4 (four) times daily as needed for diarrhea or loose stools. 60 tablet 2  . dronabinol (MARINOL) 5 MG capsule Take 1 capsule (5 mg total) by mouth 2 (two) times daily before a meal. 60 capsule 2  . FLUoxetine (PROZAC) 40 MG capsule Take 1 capsule (40 mg total) by mouth 2 (two) times daily. 180 capsule 0  . fluticasone (FLONASE) 50 MCG/ACT nasal spray INSTILL 2 SPRAYS IN EACH NOSTRIL EVERY DAY 16 g 11  . gabapentin (NEURONTIN) 300 MG capsule Take 1 capsule (300 mg total) by mouth 2 (two) times daily. 180 capsule 1  . gabapentin (NEURONTIN) 600 MG tablet gabapentin 600 mg tablet    . HYDROmorphone (DILAUDID) 2 MG tablet Take 1 tablet (2 mg total) by mouth every 8 (eight) hours as needed for severe pain. Please fill on or  after 05/15/18 120 tablet 0  . lidocaine-prilocaine (EMLA) cream Apply 1 application topically as needed. 30 g 5  . losartan-hydrochlorothiazide (HYZAAR) 50-12.5 MG tablet Take 1 tablet by mouth daily. 90 tablet 3  . Melatonin 5 MG TABS Take 5 mg by mouth at bedtime. Takes 10 mg at HS    . metoprolol tartrate (LOPRESSOR) 25 MG tablet Take 1 tablet (25 mg total) by mouth 2 (two) times daily. 180 tablet 1  . mupirocin ointment (BACTROBAN) 2 % APPLY AA OF THE SKIN QD  0  . Omega-3 Fatty Acids (FISH OIL OMEGA-3) 1000 MG CAPS Take 1,000 mg by mouth daily.     . ondansetron (ZOFRAN) 8 MG tablet Take 1 tablet (8 mg total) by mouth 3 (three) times daily. 90 tablet 3  . pantoprazole (PROTONIX) 40 MG tablet Take 1 tablet (40 mg total) by mouth every evening. 90 tablet 1  . potassium chloride SA (K-DUR,KLOR-CON) 20 MEQ tablet DISSOLVE 4 TABLETS IN 4 TO 8 OUNCES OF WATER AND DRINK BY MOUTH ONCE ON DAY 1 THEN DISSOLVE 2 TABLETS AND DRINK ONCE DAILY FOR 10 DAYS 216 tablet 0  . potassium chloride SA (K-DUR,KLOR-CON) 20 MEQ tablet Take 2 tablets (40 mEq total) by mouth 2 (two) times daily. Take 2  tablets 2x a day for 5 days, then take 1 tablet 2x a day. 60 tablet 2  . pravastatin (PRAVACHOL) 20 MG tablet Take 1 tablet (20 mg total) by mouth daily. (Patient taking differently: Take 20 mg by mouth every evening. ) 90 tablet 1  . predniSONE (DELTASONE) 20 MG tablet TAKE 1 TABLET(20 MG) BY MOUTH DAILY WITH BREAKFAST (Patient taking differently: Take 10 mg by mouth daily with breakfast. ) 90 tablet 0  . predniSONE (DELTASONE) 5 MG tablet Take 1 tablet (5 mg total) by mouth daily with breakfast. 90 tablet 2  . prochlorperazine (COMPAZINE) 10 MG tablet TAKE 1 TABLET(10 MG) BY MOUTH EVERY 6 HOURS AS NEEDED FOR NAUSEA OR VOMITING 385 tablet 1  . promethazine (PHENERGAN) 25 MG/ML injection promethazine 25 mg/mL injection solution  Take 1 mL by injection route for 1 day.    . tadalafil (CIALIS) 5 MG tablet Take 5 mg by mouth as  needed.    . torsemide (DEMADEX) 10 MG tablet Take 1-2 tablets (10-20 mg total) by mouth daily. 60 tablet 3  . traZODone (DESYREL) 100 MG tablet TAKE 1 TABLET(100 MG) BY MOUTH AT BEDTIME AS NEEDED FOR SLEEP 30 tablet 2  . vitamin B-12 (CYANOCOBALAMIN) 1000 MCG tablet Take 1,000 mcg by mouth daily.      No facility-administered medications prior to visit.     ROS: Review of Systems  Constitutional: Positive for fatigue. Negative for appetite change and unexpected weight change.  HENT: Negative for congestion, nosebleeds, sneezing, sore throat and trouble swallowing.   Eyes: Negative for itching and visual disturbance.  Respiratory: Negative for cough.   Cardiovascular: Positive for leg swelling. Negative for chest pain and palpitations.  Gastrointestinal: Negative for abdominal distention, blood in stool, diarrhea and nausea.  Genitourinary: Negative for frequency and hematuria.  Musculoskeletal: Positive for arthralgias, back pain, gait problem, joint swelling, neck pain and neck stiffness.  Skin: Negative for rash.  Neurological: Positive for weakness. Negative for dizziness, tremors and speech difficulty.  Psychiatric/Behavioral: Negative for agitation, dysphoric mood, sleep disturbance and suicidal ideas. The patient is nervous/anxious.     Objective:  There were no vitals taken for this visit.  BP Readings from Last 3 Encounters:  04/16/18 (!) 98/51  04/16/18 (!) 90/44  03/27/18 130/84    Wt Readings from Last 3 Encounters:  04/16/18 139 lb (63 kg)  03/27/18 146 lb (66.2 kg)  03/23/18 145 lb 1.9 oz (65.8 kg)    Physical Exam Constitutional:      General: He is not in acute distress.    Appearance: He is well-developed.     Comments: NAD  Eyes:     Conjunctiva/sclera: Conjunctivae normal.     Pupils: Pupils are equal, round, and reactive to light.  Neck:     Musculoskeletal: Normal range of motion.     Thyroid: No thyromegaly.     Vascular: No JVD.    Cardiovascular:     Rate and Rhythm: Normal rate and regular rhythm.     Heart sounds: Normal heart sounds. No murmur. No friction rub. No gallop.   Pulmonary:     Effort: Pulmonary effort is normal. No respiratory distress.     Breath sounds: Normal breath sounds. No wheezing or rales.  Chest:     Chest wall: No tenderness.  Abdominal:     General: Bowel sounds are normal. There is no distension.     Palpations: Abdomen is soft. There is no mass.     Tenderness:  There is no abdominal tenderness. There is no guarding or rebound.  Musculoskeletal: Normal range of motion.        General: No tenderness.  Lymphadenopathy:     Cervical: No cervical adenopathy.  Skin:    General: Skin is warm and dry.     Findings: No rash.  Neurological:     Mental Status: He is alert and oriented to person, place, and time.     Cranial Nerves: No cranial nerve deficit.     Motor: No abnormal muscle tone.     Coordination: Coordination normal.     Gait: Gait normal.     Deep Tendon Reflexes: Reflexes are normal and symmetric.  Psychiatric:        Behavior: Behavior normal.        Thought Content: Thought content normal.        Judgment: Judgment normal.    B edema 1-2 + B   Lab Results  Component Value Date   WBC 5.7 04/16/2018   HGB 11.4 (L) 04/16/2018   HCT 33.2 (L) 04/16/2018   PLT 192 04/16/2018   GLUCOSE 117 (H) 04/16/2018   CHOL 128 03/10/2015   TRIG 95.0 03/10/2015   HDL 39.30 03/10/2015   LDLCALC 70 03/10/2015   ALT 10 04/16/2018   AST 7 (L) 04/16/2018   NA 127 (L) 04/16/2018   K 3.5 04/16/2018   CL 89 (L) 04/16/2018   CREATININE 1.48 (H) 04/16/2018   BUN 45 (H) 04/16/2018   CO2 32 04/16/2018   TSH 0.680 09/18/2017   PSA 1.95 10/15/2010   INR 1.1 (H) 06/14/2014   HGBA1C 6.0 09/28/2015    Nm Pet Image Restage (ps) Whole Body  Result Date: 04/03/2018 CLINICAL DATA:  Subsequent treatment strategy for 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  EXAM: NUCLEAR MEDICINE PET WHOLE BODY TECHNIQUE: 6.6 mCi F-18 FDG was injected intravenously. Full-ring PET imaging was performed from the skull base to thigh after the radiotracer. CT data was obtained and used for attenuation correction and anatomic localization. Fasting blood glucose: 87 mg/dl COMPARISON:  PET-CT dated 03/07/2017. CT chest abdomen pelvis dated 10/14/2017. FINDINGS: Mediastinal blood pool activity: SUV max 1.8 HEAD/NECK: No hypermetabolic activity on the scalp. No hypermetabolic cervical lymphadenopathy. Incidental CT findings: none CHEST: No suspicious pulmonary nodules. No hypermetabolic thoracic lymphadenopathy. Stable eventration of the right hemidiaphragm with associated scarring/atelectasis in the superior segment right lower lobe, non FDG avid. Incidental CT findings: Atherosclerotic calcifications of the aortic arch. Three vessel coronary atherosclerosis. Left chest port terminates in the mid SVC. ABDOMEN/PELVIS: No abnormal hypermetabolism in the liver spleen, pancreas, or adrenal glands. No suspicious abdominopelvic lymphadenopathy. Status post left inguinal lymph node dissection with mild postsurgical changes. No residual lymphadenopathy or hypermetabolism. Probable sebaceous cyst along the right anterior abdominal wall (series 5/image 152), non FDG avid. Incidental CT findings: Bilateral renal cysts. Atherosclerotic calcifications the abdominal aorta and branch vessels. Left colonic diverticulosis, without evidence of diverticulitis. Bladder is mildly thick-walled although underdistended. SKELETON: No focal hypermetabolic activity to suggest skeletal metastasis. Incidental CT findings: Status post posterior lumbar fixation at L1-S1, with stable fracture of the S1 pedicle screws (series 5/image 175). Degenerative changes of the visualized thoracolumbar spine. EXTREMITIES: No abnormal hypermetabolic activity in the lower extremities. Incidental CT findings: Bilateral shoulder  arthroplasties, without evidence of complication. Status post left knee arthroplasty. Status post medial compartment right knee hemiarthroplasty. IMPRESSION: Status post left inguinal lymph node dissection with mild postsurgical changes. No residual lymphadenopathy  hypermetabolism. No findings suspicious for recurrent or metastatic disease. Additional stable ancillary findings as above. Electronically Signed   By: Julian Hy M.D.   On: 04/03/2018 10:32    Assessment & Plan:   There are no diagnoses linked to this encounter.   No orders of the defined types were placed in this encounter.    Follow-up: No follow-ups on file.  [name], MD

## 2018-04-21 NOTE — Assessment & Plan Note (Signed)
Discussed.

## 2018-04-21 NOTE — Patient Instructions (Addendum)
Try GLYTONE exfoliating body lotion by Desmond Dike (free acid value 17.5) on the foot callus twice a day - buy a small bottle   ?trekking poles for walking

## 2018-04-24 ENCOUNTER — Other Ambulatory Visit: Payer: Self-pay | Admitting: Internal Medicine

## 2018-04-24 DIAGNOSIS — M25561 Pain in right knee: Secondary | ICD-10-CM | POA: Diagnosis not present

## 2018-04-24 DIAGNOSIS — M6281 Muscle weakness (generalized): Secondary | ICD-10-CM | POA: Diagnosis not present

## 2018-04-24 DIAGNOSIS — R2681 Unsteadiness on feet: Secondary | ICD-10-CM | POA: Diagnosis not present

## 2018-04-24 DIAGNOSIS — R262 Difficulty in walking, not elsewhere classified: Secondary | ICD-10-CM | POA: Diagnosis not present

## 2018-04-24 DIAGNOSIS — R278 Other lack of coordination: Secondary | ICD-10-CM | POA: Diagnosis not present

## 2018-04-27 DIAGNOSIS — R278 Other lack of coordination: Secondary | ICD-10-CM | POA: Diagnosis not present

## 2018-04-27 DIAGNOSIS — M25561 Pain in right knee: Secondary | ICD-10-CM | POA: Diagnosis not present

## 2018-04-27 DIAGNOSIS — R262 Difficulty in walking, not elsewhere classified: Secondary | ICD-10-CM | POA: Diagnosis not present

## 2018-04-27 DIAGNOSIS — M6281 Muscle weakness (generalized): Secondary | ICD-10-CM | POA: Diagnosis not present

## 2018-04-27 DIAGNOSIS — R2681 Unsteadiness on feet: Secondary | ICD-10-CM | POA: Diagnosis not present

## 2018-04-28 DIAGNOSIS — M6281 Muscle weakness (generalized): Secondary | ICD-10-CM | POA: Diagnosis not present

## 2018-04-28 DIAGNOSIS — R278 Other lack of coordination: Secondary | ICD-10-CM | POA: Diagnosis not present

## 2018-04-28 DIAGNOSIS — M25561 Pain in right knee: Secondary | ICD-10-CM | POA: Diagnosis not present

## 2018-04-28 DIAGNOSIS — R2681 Unsteadiness on feet: Secondary | ICD-10-CM | POA: Diagnosis not present

## 2018-04-28 DIAGNOSIS — R262 Difficulty in walking, not elsewhere classified: Secondary | ICD-10-CM | POA: Diagnosis not present

## 2018-04-29 DIAGNOSIS — M25561 Pain in right knee: Secondary | ICD-10-CM | POA: Diagnosis not present

## 2018-04-29 DIAGNOSIS — M6281 Muscle weakness (generalized): Secondary | ICD-10-CM | POA: Diagnosis not present

## 2018-04-29 DIAGNOSIS — R2681 Unsteadiness on feet: Secondary | ICD-10-CM | POA: Diagnosis not present

## 2018-04-29 DIAGNOSIS — R262 Difficulty in walking, not elsewhere classified: Secondary | ICD-10-CM | POA: Diagnosis not present

## 2018-04-29 DIAGNOSIS — R278 Other lack of coordination: Secondary | ICD-10-CM | POA: Diagnosis not present

## 2018-04-30 DIAGNOSIS — R278 Other lack of coordination: Secondary | ICD-10-CM | POA: Diagnosis not present

## 2018-04-30 DIAGNOSIS — M25561 Pain in right knee: Secondary | ICD-10-CM | POA: Diagnosis not present

## 2018-04-30 DIAGNOSIS — R2681 Unsteadiness on feet: Secondary | ICD-10-CM | POA: Diagnosis not present

## 2018-04-30 DIAGNOSIS — M6281 Muscle weakness (generalized): Secondary | ICD-10-CM | POA: Diagnosis not present

## 2018-04-30 DIAGNOSIS — R262 Difficulty in walking, not elsewhere classified: Secondary | ICD-10-CM | POA: Diagnosis not present

## 2018-05-04 DIAGNOSIS — M47812 Spondylosis without myelopathy or radiculopathy, cervical region: Secondary | ICD-10-CM | POA: Diagnosis not present

## 2018-05-05 ENCOUNTER — Other Ambulatory Visit: Payer: Self-pay | Admitting: *Deleted

## 2018-05-05 ENCOUNTER — Encounter: Payer: Self-pay | Admitting: Gastroenterology

## 2018-05-05 ENCOUNTER — Ambulatory Visit (INDEPENDENT_AMBULATORY_CARE_PROVIDER_SITE_OTHER): Payer: Medicare Other | Admitting: Gastroenterology

## 2018-05-05 VITALS — BP 130/60 | HR 64 | Ht 66.0 in | Wt 138.0 lb

## 2018-05-05 DIAGNOSIS — R634 Abnormal weight loss: Secondary | ICD-10-CM

## 2018-05-05 DIAGNOSIS — R630 Anorexia: Secondary | ICD-10-CM

## 2018-05-05 DIAGNOSIS — M25561 Pain in right knee: Secondary | ICD-10-CM | POA: Diagnosis not present

## 2018-05-05 DIAGNOSIS — M6281 Muscle weakness (generalized): Secondary | ICD-10-CM | POA: Diagnosis not present

## 2018-05-05 DIAGNOSIS — R2681 Unsteadiness on feet: Secondary | ICD-10-CM | POA: Diagnosis not present

## 2018-05-05 DIAGNOSIS — C4372 Malignant melanoma of left lower limb, including hip: Secondary | ICD-10-CM

## 2018-05-05 DIAGNOSIS — K5909 Other constipation: Secondary | ICD-10-CM

## 2018-05-05 DIAGNOSIS — R262 Difficulty in walking, not elsewhere classified: Secondary | ICD-10-CM | POA: Diagnosis not present

## 2018-05-05 DIAGNOSIS — R278 Other lack of coordination: Secondary | ICD-10-CM | POA: Diagnosis not present

## 2018-05-05 DIAGNOSIS — Z86010 Personal history of colonic polyps: Secondary | ICD-10-CM | POA: Diagnosis not present

## 2018-05-05 NOTE — Progress Notes (Signed)
Review of pertinent gastrointestinal problems: 1. Adenomatous colon polyps; Colonoscopy Dr. Ardis Hughs 01/2012, two subCM polyps, not precancerous.  several previous colonsocopies with Dr. Oletta Lamas, most recent was 2007, "fragments of adenoma".  He tells me he was getting about annual colonoscopy with Dr. Oletta Lamas for many years for multiple polyps, precancerous.   HPI: This is a very pleasant 73 year old man whom I last saw about a year ago.  At that time we discussed surveillance colonoscopy however he had just been recently diagnosed with malignant melanoma and was starting chemotherapy so we decided to delay it until now to discuss.  He has no troubles with his bowels.  No significant constipation, no bleeding, no serious abdominal pains.  He takes Plavix, under the direction of cardiology for drug-eluting stents in his coronary arteries.  His cardiologist Dr. Nolon Lennert  Last dose chemo 2 months ago.  Chief complaint is personal history of adenomatous, precancerous polyps  ROS: complete GI ROS as described in HPI, all other review negative.  Constitutional:  No unintentional weight loss   Past Medical History:  Diagnosis Date  . Alcoholism /alcohol abuse ([place])    per family  . Allergic rhinitis   . Anxiety   . Bacterial infection   . CAD (coronary artery disease)    minimal coronary plaque in the LAD and right coronary system. 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 7-06. Last nuclear stress 11-17-06 fixed anterior/ inferior defect, no inducible ischemia, EF 81%  . Chronic back pain    "all over back"  . Chronic neck pain   . Depression   . Diverticulosis   . Dyspnea    with exertion  . Dysrhythmia 01-24-12   past hx. A.Fib x1 episode-responded to med.  . Falls frequently    "since 02/2013" (06/16/2013)  . Family history of cancer   . Genetic testing 03/06/2017   Multi-Cancer panel (83 genes) @ [facility] - No  pathogenic mutations detected  . GERD (gastroesophageal reflux disease)   . Hemochromatosis    dx'd 14 yrs ago last ferritin Aug 11, 08 52 (22-322), Fe 136 ("I had 250 phlebotomies for that")  . High cholesterol    hx  . Hx of colonic polyps   . Hx of colonoscopy   . Hypertension   . Iron deficiency anemia due to chronic blood loss 12/27/2016  . Malignant melanoma of knee, left ([place])   . Myocardial infarction [facility]) 2006   "related to catheterization"  . Narcotic abuse ([place])    per family  . Osteoarthritis   . PONV (postoperative nausea and vomiting)   . RA (rheumatoid arthritis) ([place])     Past Surgical History:  Procedure Laterality Date  . ABDOMINAL ADHESION SURGERY  ~ 1968  . ANKLE RECONSTRUCTION Right 6-09   [facility]  . APPENDECTOMY  ~ 1956  . ASPIRATION OF ABSCESS Left 03/27/2017   Procedure: ASPIRATION OF SEROMA;  Surgeon: Stark Klein, MD;  Location: [facility];  Service: General;  Laterality: Left;  . BONE TUMOR RESECTION  ~ 1954   "taken off my mastoid"  . CARPAL TUNNEL RELEASE Right 1990's  . CATARACT EXTRACTION, BILATERAL Bilateral 01-24-12  . CORONARY ANGIOPLASTY WITH STENT PLACEMENT  2006   "while repairing 1st stent, a second area tore and they had to place 2nd stent " ?LAD & CX  . CORONARY ANGIOPLASTY WITH STENT PLACEMENT  06/17/2014  . CYST EXCISION  "several OR's"   "backX 2, back  of my neck, face, inside right bicept, chest, wrist"  . FOOT SURGERY Right 11-08   for removal of bone spurs-  . HAMMER TOE SURGERY Right 07/2012   "broke 4 hammertoes"   . HARDWARE REMOVAL  03/09/2012   Procedure: HARDWARE REMOVAL;  Surgeon: Nita Sells, MD;  Location: [facility];  Service: Orthopedics;  Laterality: Left;  Hardware Removal from Left Shoulder  . HARVEST BONE GRAFT  02/06/2012   Procedure: HARVEST ILIAC BONE GRAFT;  Surgeon: Nita Sells, MD;  Location[facility];  Service: Orthopedics;;  bone marrow aspirqation   .  INGUINAL HERNIA REPAIR Bilateral   . JOINT REPLACEMENT    . KNEE SURGERY Left ~ 2003   "6-12 months after uni knee removed synovial sack"  . LEFT HEART CATHETERIZATION WITH CORONARY ANGIOGRAM N/A 06/17/2014   PCI of diffuse severe stenosis in the proximal to mid LAD using overlapping drug-eluting stents.  . LUMBAR WOUND DEBRIDEMENT N/A 03/17/2013   Procedure: Incision and drainage of superficial lumbar wound;  Surgeon: Floyce Stakes, MD;  Location: [facility];  Service: Neurosurgery;  Laterality: N/A;  Incision and drainage of superficial lumbar wound  . MECKEL DIVERTICULUM EXCISION  ~ 1956  . MELANOMA EXCISION WITH SENTINEL LYMPH NODE BIOPSY Left 02/12/2017   WIDE LOCAL EXCISION LEFT KNEE MELANOMA, ADVANCEMENT FLAP CLOSURE, AND SENTINEL LYMPH NODE MAPPING AND BIOPSY.  [name] MELANOMA EXCISION WITH SENTINEL LYMPH NODE BIOPSY Left 02/12/2017   Procedure: WIDE LOCAL EXCISION LEFT KNEE MELANOMA, ADVANCEMENT FLAP CLOSURE, AND SENTINEL LYMPH NODE MAPPING AND BIOPSY.;  Surgeon: Stark Klein, MD;  Location: [facility];  Service: General;  Laterality: Left;  GENERAL AND LOCAL  . ORIF SHOULDER FRACTURE  02/06/2012   Procedure: OPEN REDUCTION INTERNAL FIXATION (ORIF) SHOULDER FRACTURE;  Surgeon: Nita Sells, MD;  Location[facility];  Service: Orthopedics;  Laterality: Left;  ORIF of a Left Shoulder Fracture with  Iliac Crest Bone Graft aspiration   . PORTACATH PLACEMENT Left 03/27/2017   Procedure: INSERTION PORT-A-CATH;  Surgeon: Stark Klein, MD;  Location: [facility];  Service: General;  Laterality: Left;  . POSTERIOR LUMBAR FUSION  12-10   L4-5 diskectomy w/ fusion, cage placement and rods; [facility]  . POSTERIOR LUMBAR FUSION 4 LEVEL N/A 03/02/2013   Procedure: Lumbar One to Sacral One Posterior lumbar interbody fusion;  Surgeon: Floyce Stakes, MD;  Location: [place] [facility];  Service: Neurosurgery;  Laterality: N/A;  L1 to S1 Posterior lumbar interbody fusion  . REPLACEMENT  UNICONDYLAR JOINT KNEE Left ~ 2003   "~ 6 months after total knee replaced"  . SHOULDER ARTHROSCOPY Left ~ 2004 X 2   "@ [facility]; left bone splinter in & had to clean it out"  . TOTAL ANKLE REPLACEMENT Right 2008   at [facility]  . TOTAL KNEE ARTHROPLASTY Bilateral 2002  . TOTAL SHOULDER REPLACEMENT Left 2006  . TOTAL SHOULDER REPLACEMENT Right ~ 2007   Dr. Marlou Sa    Current Outpatient Medications  Medication Sig Dispense Refill  . albuterol (PROVENTIL HFA;VENTOLIN HFA) 108 (90 Base) MCG/ACT inhaler Inhale 1-2 puffs into the lungs every 4 (four) hours as needed for wheezing or shortness of breath. 1 Inhaler 1  . alfuzosin (UROXATRAL) 10 MG 24 hr tablet Take 10 mg by mouth at bedtime.   0  . ALPRAZolam (XANAX) 0.5 MG tablet TAKE 1 TABLET BY MOUTH TWICE DAILY AS NEEDED FOR ANXIETY 60 tablet 3  . Cholecalciferol (VITAMIN D3) 2000 UNITS capsule Take 1  capsule (2,000 Units total) by mouth daily. 100 capsule 3  . clopidogrel (PLAVIX) 75 MG tablet TAKE 1 TABLET BY MOUTH EVERY DAY WITH BREAKFAST 90 tablet 3  . dronabinol (MARINOL) 5 MG capsule Take 1 capsule (5 mg total) by mouth 2 (two) times daily before a meal. 60 capsule 2  . FLUoxetine (PROZAC) 40 MG capsule Take 1 capsule (40 mg total) by mouth 2 (two) times daily. 180 capsule 0  . fluticasone (FLONASE) 50 MCG/ACT nasal spray INSTILL 2 SPRAYS IN EACH NOSTRIL EVERY DAY 16 g 11  . gabapentin (NEURONTIN) 300 MG capsule Take 1 capsule (300 mg total) by mouth 2 (two) times daily. 180 capsule 1  . HYDROmorphone (DILAUDID) 2 MG tablet Take 1 tablet (2 mg total) by mouth every 8 (eight) hours as needed for severe pain. Please fill on or after 05/15/18 120 tablet 0  . lidocaine-prilocaine (EMLA) cream Apply 1 application topically as needed. 30 g 5  . losartan-hydrochlorothiazide (HYZAAR) 50-12.5 MG tablet Take 1 tablet by mouth daily. 90 tablet 3  . Melatonin 5 MG TABS Take 5 mg by mouth at bedtime. Takes 10 mg at HS    . metoprolol tartrate (LOPRESSOR) 25 MG  tablet Take 0.5 tablets (12.5 mg total) by mouth 2 (two) times daily. 180 tablet 1  . mupirocin ointment (BACTROBAN) 2 % APPLY AA OF THE SKIN QD  0  . Omega-3 Fatty Acids (FISH OIL OMEGA-3) 1000 MG CAPS Take 1,000 mg by mouth daily.     . ondansetron (ZOFRAN) 8 MG tablet Take 1 tablet (8 mg total) by mouth 3 (three) times daily. 90 tablet 3  . pantoprazole (PROTONIX) 40 MG tablet Take 1 tablet (40 mg total) by mouth every evening. 90 tablet 1  . potassium chloride SA (K-DUR,KLOR-CON) 20 MEQ tablet DISSOLVE 4 TABLETS IN 4 TO 8 OUNCES OF WATER AND DRINK BY MOUTH ONCE ON DAY 1 THEN DISSOLVE 2 TABLETS AND DRINK ONCE DAILY FOR 10 DAYS 216 tablet 0  . pravastatin (PRAVACHOL) 20 MG tablet Take 1 tablet (20 mg total) by mouth daily. (Patient taking differently: Take 20 mg by mouth every evening. ) 90 tablet 1  . predniSONE (DELTASONE) 5 MG tablet Take 2 tablets (10 mg total) by mouth daily with breakfast. 90 tablet 2  . prochlorperazine (COMPAZINE) 10 MG tablet TAKE 1 TABLET(10 MG) BY MOUTH EVERY 6 HOURS AS NEEDED FOR NAUSEA OR VOMITING 385 tablet 1  . tadalafil (CIALIS) 5 MG tablet Take 5 mg by mouth as needed.    . torsemide (DEMADEX) 10 MG tablet Take 1-2 tablets (10-20 mg total) by mouth daily. 60 tablet 3  . traZODone (DESYREL) 100 MG tablet TAKE 1 TABLET(100 MG) BY MOUTH AT BEDTIME AS NEEDED FOR SLEEP 30 tablet 2  . vitamin B-12 (CYANOCOBALAMIN) 1000 MCG tablet Take 1,000 mcg by mouth daily.      No current facility-administered medications for this visit.     Allergies as of 05/05/2018 - Review Complete 05/05/2018  Allergen Reaction Noted  . Cefepime Hives and Shortness Of Breath 11/19/2013  . Cephaeline Hives 07/24/2017  . Morphine and related Shortness Of Breath, Nausea And Vomiting, Swelling, and Other (See Comments) 04/10/2011  . Penicillins Hives, Shortness Of Breath, and Rash 07/06/2014  . Morphine  07/06/2014  . Oxycodone-acetaminophen  07/06/2014  . Doxycycline Rash 07/02/2017     Family History  Problem Relation Age of Onset  . Uterine cancer Mother        uterine vs. cervix?  dx 74s; deceased 43  . Macular degeneration Mother   . Other Mother        ankle edema  . Lung cancer Mother   . Melanoma Mother        multiple spots on legs  . Cancer - Lung Mother        smoker  . Coronary artery disease Father   . Hypertension Father   . Prostate cancer Father   . Colon polyps Father   . Cancer - Prostate Father 46       deceased 24  . Heart attack Brother   . Hyperlipidemia Brother   . Other Brother        Schizophrenic  . Thyroid disease Sister   . Hemochromatosis Sister   . Rheum arthritis Sister   . Coronary artery disease Maternal Aunt   . Heart attack Maternal Aunt   . Cancer Maternal Grandmother        unk. type; deceased 45s  . Diabetes Neg Hx   . Colon cancer Neg Hx   . Esophageal cancer Neg Hx   . Rectal cancer Neg Hx   . Stomach cancer Neg Hx     Social History   Socioeconomic History  . Marital status: Significant Other    Spouse name: Not on file  . Number of children: 2  . Years of education: 33  . Highest education level: Not on file  Occupational History  . 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    Comment: self employed  Social Needs  . Financial resource strain: Not hard at all  . Food insecurity:    Worry: Never true    Inability: Never true  . Transportation needs:    Medical: No    Non-medical: No  Tobacco Use  . Smoking status: Never Smoker  . Smokeless tobacco: Never Used  Substance and Sexual Activity  . Alcohol use: No    Alcohol/week: 0.0 standard drinks    Comment: "I do not drink anymore"  . Drug use: No  . Sexual activity: Yes    Partners: Female  Lifestyle  . Physical activity:    Days per week: 0 days    Minutes per session: 0 min  . Stress: Very much  Relationships  . Social connections:    Talks on phone: More than three times a week    Gets together: More than three times a  week    Attends religious service: More than 4 times per year    Active member of club or organization: Yes    Attends meetings of clubs or organizations: More than 4 times per year    Relationship status: Living with partner  . Intimate partner violence:    Fear of current or ex partner: No    Emotionally abused: No    Physically abused: No    Forced sexual activity: No  Other Topics Concern  . Not on file  Social History Narrative   [employer] [place]. Married 1972-60yr seperated/divorced. Engaged April '11. 1 son '74; step-daughter '70. 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 During '11 discovered an [employer]- $500,000+ loss. Is handling this OK - will keep the business running.     Physical Exam: BP 130/60   Pulse 64   Wt 138 lb (62.6 kg)   BMI 22.27 kg/m  Constitutional: Frail Psychiatric: alert and  oriented x3 Abdomen: soft, nontender, nondistended, no obvious ascites, no peritoneal signs, normal bowel sounds No peripheral edema noted in lower extremities  Assessment and plan: 73 y.o. male with personal history of adenomatous, precancerous colon polyps  He is interested in polyp surveillance examination.  We had delayed it by about a year because he was starting melanoma chemotherapy a year ago.  He has completed the chemotherapy, his last dose was about 2 months ago.  Recent PET scan suggests that he is disease-free.  I recommended we go ahead with the surveillance examination.  He would like to delay until April until he is finished with some physical therapy that he has over the next month or 2.  He understands that he will need to stop his Plavix for 5 days prior.  He has done that many times.  Will make sure that his cardiologist is okay with that recommendation.  Please see the "Patient Instructions" section for addition details about the  plan.  Owens Loffler, MD [facility] 05/05/2018, 1:51 PM

## 2018-05-05 NOTE — Patient Instructions (Addendum)
You will be set up for a colonoscopy ([place]) for polyp surveillance (in April sometime per patient).  Stop plavix for 5 days prior to the colonoscopy, will ask Dr. Johnsie Cancel if that is safe for you.  Thank you for entrusting me with your care and choosing [facility].  Dr Ardis Hughs

## 2018-05-06 DIAGNOSIS — R262 Difficulty in walking, not elsewhere classified: Secondary | ICD-10-CM | POA: Diagnosis not present

## 2018-05-06 DIAGNOSIS — M25561 Pain in right knee: Secondary | ICD-10-CM | POA: Diagnosis not present

## 2018-05-06 DIAGNOSIS — R278 Other lack of coordination: Secondary | ICD-10-CM | POA: Diagnosis not present

## 2018-05-06 DIAGNOSIS — R2681 Unsteadiness on feet: Secondary | ICD-10-CM | POA: Diagnosis not present

## 2018-05-06 DIAGNOSIS — M6281 Muscle weakness (generalized): Secondary | ICD-10-CM | POA: Diagnosis not present

## 2018-05-06 MED ORDER — DRONABINOL 5 MG PO CAPS: 5.0000 mg | ORAL_CAPSULE | Freq: Two times a day (BID) | ORAL | 2 refills | Status: DC

## 2018-05-07 DIAGNOSIS — R278 Other lack of coordination: Secondary | ICD-10-CM | POA: Diagnosis not present

## 2018-05-07 DIAGNOSIS — R2681 Unsteadiness on feet: Secondary | ICD-10-CM | POA: Diagnosis not present

## 2018-05-07 DIAGNOSIS — M25561 Pain in right knee: Secondary | ICD-10-CM | POA: Diagnosis not present

## 2018-05-07 DIAGNOSIS — M6281 Muscle weakness (generalized): Secondary | ICD-10-CM | POA: Diagnosis not present

## 2018-05-07 DIAGNOSIS — R262 Difficulty in walking, not elsewhere classified: Secondary | ICD-10-CM | POA: Diagnosis not present

## 2018-05-08 DIAGNOSIS — R278 Other lack of coordination: Secondary | ICD-10-CM | POA: Diagnosis not present

## 2018-05-08 DIAGNOSIS — R262 Difficulty in walking, not elsewhere classified: Secondary | ICD-10-CM | POA: Diagnosis not present

## 2018-05-08 DIAGNOSIS — M25561 Pain in right knee: Secondary | ICD-10-CM | POA: Diagnosis not present

## 2018-05-08 DIAGNOSIS — M6281 Muscle weakness (generalized): Secondary | ICD-10-CM | POA: Diagnosis not present

## 2018-05-08 DIAGNOSIS — R2681 Unsteadiness on feet: Secondary | ICD-10-CM | POA: Diagnosis not present

## 2018-05-11 DIAGNOSIS — M25561 Pain in right knee: Secondary | ICD-10-CM | POA: Diagnosis not present

## 2018-05-11 DIAGNOSIS — R2681 Unsteadiness on feet: Secondary | ICD-10-CM | POA: Diagnosis not present

## 2018-05-11 DIAGNOSIS — R278 Other lack of coordination: Secondary | ICD-10-CM | POA: Diagnosis not present

## 2018-05-11 DIAGNOSIS — M6281 Muscle weakness (generalized): Secondary | ICD-10-CM | POA: Diagnosis not present

## 2018-05-11 DIAGNOSIS — R262 Difficulty in walking, not elsewhere classified: Secondary | ICD-10-CM | POA: Diagnosis not present

## 2018-05-12 DIAGNOSIS — R278 Other lack of coordination: Secondary | ICD-10-CM | POA: Diagnosis not present

## 2018-05-12 DIAGNOSIS — R262 Difficulty in walking, not elsewhere classified: Secondary | ICD-10-CM | POA: Diagnosis not present

## 2018-05-12 DIAGNOSIS — R2681 Unsteadiness on feet: Secondary | ICD-10-CM | POA: Diagnosis not present

## 2018-05-12 DIAGNOSIS — M25561 Pain in right knee: Secondary | ICD-10-CM | POA: Diagnosis not present

## 2018-05-12 DIAGNOSIS — M6281 Muscle weakness (generalized): Secondary | ICD-10-CM | POA: Diagnosis not present

## 2018-05-13 DIAGNOSIS — R278 Other lack of coordination: Secondary | ICD-10-CM | POA: Diagnosis not present

## 2018-05-13 DIAGNOSIS — M25561 Pain in right knee: Secondary | ICD-10-CM | POA: Diagnosis not present

## 2018-05-13 DIAGNOSIS — R262 Difficulty in walking, not elsewhere classified: Secondary | ICD-10-CM | POA: Diagnosis not present

## 2018-05-13 DIAGNOSIS — R2681 Unsteadiness on feet: Secondary | ICD-10-CM | POA: Diagnosis not present

## 2018-05-13 DIAGNOSIS — M6281 Muscle weakness (generalized): Secondary | ICD-10-CM | POA: Diagnosis not present

## 2018-05-14 DIAGNOSIS — M25561 Pain in right knee: Secondary | ICD-10-CM | POA: Diagnosis not present

## 2018-05-14 DIAGNOSIS — R262 Difficulty in walking, not elsewhere classified: Secondary | ICD-10-CM | POA: Diagnosis not present

## 2018-05-14 DIAGNOSIS — R278 Other lack of coordination: Secondary | ICD-10-CM | POA: Diagnosis not present

## 2018-05-14 DIAGNOSIS — M6281 Muscle weakness (generalized): Secondary | ICD-10-CM | POA: Diagnosis not present

## 2018-05-14 DIAGNOSIS — R2681 Unsteadiness on feet: Secondary | ICD-10-CM | POA: Diagnosis not present

## 2018-05-15 DIAGNOSIS — M6281 Muscle weakness (generalized): Secondary | ICD-10-CM | POA: Diagnosis not present

## 2018-05-15 DIAGNOSIS — R278 Other lack of coordination: Secondary | ICD-10-CM | POA: Diagnosis not present

## 2018-05-15 DIAGNOSIS — R262 Difficulty in walking, not elsewhere classified: Secondary | ICD-10-CM | POA: Diagnosis not present

## 2018-05-15 DIAGNOSIS — M25561 Pain in right knee: Secondary | ICD-10-CM | POA: Diagnosis not present

## 2018-05-15 DIAGNOSIS — R2681 Unsteadiness on feet: Secondary | ICD-10-CM | POA: Diagnosis not present

## 2018-05-19 DIAGNOSIS — R278 Other lack of coordination: Secondary | ICD-10-CM | POA: Diagnosis not present

## 2018-05-19 DIAGNOSIS — R262 Difficulty in walking, not elsewhere classified: Secondary | ICD-10-CM | POA: Diagnosis not present

## 2018-05-19 DIAGNOSIS — M6281 Muscle weakness (generalized): Secondary | ICD-10-CM | POA: Diagnosis not present

## 2018-05-19 DIAGNOSIS — R2681 Unsteadiness on feet: Secondary | ICD-10-CM | POA: Diagnosis not present

## 2018-05-19 DIAGNOSIS — M25561 Pain in right knee: Secondary | ICD-10-CM | POA: Diagnosis not present

## 2018-05-26 DIAGNOSIS — M25561 Pain in right knee: Secondary | ICD-10-CM | POA: Diagnosis not present

## 2018-05-26 DIAGNOSIS — R278 Other lack of coordination: Secondary | ICD-10-CM | POA: Diagnosis not present

## 2018-05-26 DIAGNOSIS — R2681 Unsteadiness on feet: Secondary | ICD-10-CM | POA: Diagnosis not present

## 2018-05-26 DIAGNOSIS — M6281 Muscle weakness (generalized): Secondary | ICD-10-CM | POA: Diagnosis not present

## 2018-05-26 DIAGNOSIS — R262 Difficulty in walking, not elsewhere classified: Secondary | ICD-10-CM | POA: Diagnosis not present

## 2018-05-27 DIAGNOSIS — R2681 Unsteadiness on feet: Secondary | ICD-10-CM | POA: Diagnosis not present

## 2018-05-27 DIAGNOSIS — R262 Difficulty in walking, not elsewhere classified: Secondary | ICD-10-CM | POA: Diagnosis not present

## 2018-05-27 DIAGNOSIS — R278 Other lack of coordination: Secondary | ICD-10-CM | POA: Diagnosis not present

## 2018-05-27 DIAGNOSIS — M25561 Pain in right knee: Secondary | ICD-10-CM | POA: Diagnosis not present

## 2018-05-27 DIAGNOSIS — M6281 Muscle weakness (generalized): Secondary | ICD-10-CM | POA: Diagnosis not present

## 2018-05-28 DIAGNOSIS — M6281 Muscle weakness (generalized): Secondary | ICD-10-CM | POA: Diagnosis not present

## 2018-05-28 DIAGNOSIS — R278 Other lack of coordination: Secondary | ICD-10-CM | POA: Diagnosis not present

## 2018-05-28 DIAGNOSIS — R2681 Unsteadiness on feet: Secondary | ICD-10-CM | POA: Diagnosis not present

## 2018-05-28 DIAGNOSIS — R262 Difficulty in walking, not elsewhere classified: Secondary | ICD-10-CM | POA: Diagnosis not present

## 2018-05-28 DIAGNOSIS — M25561 Pain in right knee: Secondary | ICD-10-CM | POA: Diagnosis not present

## 2018-05-29 DIAGNOSIS — R262 Difficulty in walking, not elsewhere classified: Secondary | ICD-10-CM | POA: Diagnosis not present

## 2018-05-29 DIAGNOSIS — R2681 Unsteadiness on feet: Secondary | ICD-10-CM | POA: Diagnosis not present

## 2018-05-29 DIAGNOSIS — M6281 Muscle weakness (generalized): Secondary | ICD-10-CM | POA: Diagnosis not present

## 2018-05-29 DIAGNOSIS — M25561 Pain in right knee: Secondary | ICD-10-CM | POA: Diagnosis not present

## 2018-05-29 DIAGNOSIS — R278 Other lack of coordination: Secondary | ICD-10-CM | POA: Diagnosis not present

## 2018-05-31 ENCOUNTER — Other Ambulatory Visit: Payer: Self-pay | Admitting: Internal Medicine

## 2018-06-01 DIAGNOSIS — M25561 Pain in right knee: Secondary | ICD-10-CM | POA: Diagnosis not present

## 2018-06-01 DIAGNOSIS — R278 Other lack of coordination: Secondary | ICD-10-CM | POA: Diagnosis not present

## 2018-06-01 DIAGNOSIS — R2681 Unsteadiness on feet: Secondary | ICD-10-CM | POA: Diagnosis not present

## 2018-06-01 DIAGNOSIS — M6281 Muscle weakness (generalized): Secondary | ICD-10-CM | POA: Diagnosis not present

## 2018-06-01 DIAGNOSIS — R262 Difficulty in walking, not elsewhere classified: Secondary | ICD-10-CM | POA: Diagnosis not present

## 2018-06-02 DIAGNOSIS — R278 Other lack of coordination: Secondary | ICD-10-CM | POA: Diagnosis not present

## 2018-06-02 DIAGNOSIS — R2681 Unsteadiness on feet: Secondary | ICD-10-CM | POA: Diagnosis not present

## 2018-06-02 DIAGNOSIS — M25561 Pain in right knee: Secondary | ICD-10-CM | POA: Diagnosis not present

## 2018-06-02 DIAGNOSIS — M6281 Muscle weakness (generalized): Secondary | ICD-10-CM | POA: Diagnosis not present

## 2018-06-02 DIAGNOSIS — R262 Difficulty in walking, not elsewhere classified: Secondary | ICD-10-CM | POA: Diagnosis not present

## 2018-06-04 DIAGNOSIS — R2681 Unsteadiness on feet: Secondary | ICD-10-CM | POA: Diagnosis not present

## 2018-06-04 DIAGNOSIS — R262 Difficulty in walking, not elsewhere classified: Secondary | ICD-10-CM | POA: Diagnosis not present

## 2018-06-04 DIAGNOSIS — M6281 Muscle weakness (generalized): Secondary | ICD-10-CM | POA: Diagnosis not present

## 2018-06-04 DIAGNOSIS — M25561 Pain in right knee: Secondary | ICD-10-CM | POA: Diagnosis not present

## 2018-06-04 DIAGNOSIS — R278 Other lack of coordination: Secondary | ICD-10-CM | POA: Diagnosis not present

## 2018-06-05 DIAGNOSIS — R2681 Unsteadiness on feet: Secondary | ICD-10-CM | POA: Diagnosis not present

## 2018-06-05 DIAGNOSIS — R262 Difficulty in walking, not elsewhere classified: Secondary | ICD-10-CM | POA: Diagnosis not present

## 2018-06-05 DIAGNOSIS — M25561 Pain in right knee: Secondary | ICD-10-CM | POA: Diagnosis not present

## 2018-06-05 DIAGNOSIS — M6281 Muscle weakness (generalized): Secondary | ICD-10-CM | POA: Diagnosis not present

## 2018-06-05 DIAGNOSIS — R278 Other lack of coordination: Secondary | ICD-10-CM | POA: Diagnosis not present

## 2018-06-08 DIAGNOSIS — M6281 Muscle weakness (generalized): Secondary | ICD-10-CM | POA: Diagnosis not present

## 2018-06-08 DIAGNOSIS — R262 Difficulty in walking, not elsewhere classified: Secondary | ICD-10-CM | POA: Diagnosis not present

## 2018-06-08 DIAGNOSIS — R278 Other lack of coordination: Secondary | ICD-10-CM | POA: Diagnosis not present

## 2018-06-08 DIAGNOSIS — R2681 Unsteadiness on feet: Secondary | ICD-10-CM | POA: Diagnosis not present

## 2018-06-08 DIAGNOSIS — M25561 Pain in right knee: Secondary | ICD-10-CM | POA: Diagnosis not present

## 2018-06-09 DIAGNOSIS — M25561 Pain in right knee: Secondary | ICD-10-CM | POA: Diagnosis not present

## 2018-06-09 DIAGNOSIS — M6281 Muscle weakness (generalized): Secondary | ICD-10-CM | POA: Diagnosis not present

## 2018-06-09 DIAGNOSIS — R2681 Unsteadiness on feet: Secondary | ICD-10-CM | POA: Diagnosis not present

## 2018-06-09 DIAGNOSIS — R278 Other lack of coordination: Secondary | ICD-10-CM | POA: Diagnosis not present

## 2018-06-09 DIAGNOSIS — R262 Difficulty in walking, not elsewhere classified: Secondary | ICD-10-CM | POA: Diagnosis not present

## 2018-06-10 DIAGNOSIS — M6281 Muscle weakness (generalized): Secondary | ICD-10-CM | POA: Diagnosis not present

## 2018-06-10 DIAGNOSIS — M25561 Pain in right knee: Secondary | ICD-10-CM | POA: Diagnosis not present

## 2018-06-10 DIAGNOSIS — R262 Difficulty in walking, not elsewhere classified: Secondary | ICD-10-CM | POA: Diagnosis not present

## 2018-06-10 DIAGNOSIS — R278 Other lack of coordination: Secondary | ICD-10-CM | POA: Diagnosis not present

## 2018-06-10 DIAGNOSIS — R2681 Unsteadiness on feet: Secondary | ICD-10-CM | POA: Diagnosis not present

## 2018-06-11 ENCOUNTER — Ambulatory Visit: Payer: Medicare Other | Admitting: Hematology & Oncology

## 2018-06-11 ENCOUNTER — Other Ambulatory Visit: Payer: Medicare Other

## 2018-06-11 ENCOUNTER — Inpatient Hospital Stay: Payer: Medicare Other

## 2018-06-11 ENCOUNTER — Inpatient Hospital Stay: Payer: Medicare Other | Attending: Hematology & Oncology

## 2018-06-11 ENCOUNTER — Inpatient Hospital Stay (HOSPITAL_BASED_OUTPATIENT_CLINIC_OR_DEPARTMENT_OTHER): Payer: Medicare Other | Admitting: Hematology & Oncology

## 2018-06-11 ENCOUNTER — Other Ambulatory Visit: Payer: Self-pay

## 2018-06-11 ENCOUNTER — Encounter: Payer: Self-pay | Admitting: Hematology & Oncology

## 2018-06-11 VITALS — BP 130/60 | HR 77 | Temp 98.5°F | Resp 18 | Wt 137.8 lb

## 2018-06-11 DIAGNOSIS — C4372 Malignant melanoma of left lower limb, including hip: Secondary | ICD-10-CM

## 2018-06-11 LAB — CBC WITH DIFFERENTIAL (CANCER CENTER ONLY)
Abs Immature Granulocytes: 0.04 10*3/uL (ref 0.00–0.07)
Basophils Absolute: 0 10*3/uL (ref 0.0–0.1)
Basophils Relative: 0 %
Eosinophils Absolute: 0 10*3/uL (ref 0.0–0.5)
Eosinophils Relative: 0 %
HCT: 37.4 % — ABNORMAL LOW (ref 39.0–52.0)
Hemoglobin: 12.5 g/dL — ABNORMAL LOW (ref 13.0–17.0)
Immature Granulocytes: 1 %
Lymphocytes Relative: 8 %
Lymphs Abs: 0.6 10*3/uL — ABNORMAL LOW (ref 0.7–4.0)
MCH: 32.4 pg (ref 26.0–34.0)
MCHC: 33.4 g/dL (ref 30.0–36.0)
MCV: 96.9 fL (ref 80.0–100.0)
Monocytes Absolute: 0.5 10*3/uL (ref 0.1–1.0)
Monocytes Relative: 7 %
Neutro Abs: 6 10*3/uL (ref 1.7–7.7)
Neutrophils Relative %: 84 %
Platelet Count: 217 10*3/uL (ref 150–400)
RBC: 3.86 MIL/uL — ABNORMAL LOW (ref 4.22–5.81)
RDW: 12.4 % (ref 11.5–15.5)
WBC Count: 7.1 10*3/uL (ref 4.0–10.5)
nRBC: 0 % (ref 0.0–0.2)

## 2018-06-11 LAB — CMP (CANCER CENTER ONLY)
ALT: 16 U/L (ref 0–44)
AST: 14 U/L — ABNORMAL LOW (ref 15–41)
Albumin: 3.8 g/dL (ref 3.5–5.0)
Alkaline Phosphatase: 60 U/L (ref 38–126)
Anion gap: 6 (ref 5–15)
BUN: 12 mg/dL (ref 8–23)
CO2: 29 mmol/L (ref 22–32)
Calcium: 9 mg/dL (ref 8.9–10.3)
Chloride: 96 mmol/L — ABNORMAL LOW (ref 98–111)
Creatinine: 0.86 mg/dL (ref 0.61–1.24)
GFR, Est AFR Am: >60 mL/min (ref >60)
GFR, Estimated: >60 mL/min (ref >60)
Glucose, Bld: 129 mg/dL — ABNORMAL HIGH (ref 70–99)
Potassium: 4.3 mmol/L (ref 3.5–5.1)
Sodium: 131 mmol/L — ABNORMAL LOW (ref 135–145)
Total Bilirubin: 0.7 mg/dL (ref 0.3–1.2)
Total Protein: 6.4 g/dL — ABNORMAL LOW (ref 6.5–8.1)

## 2018-06-11 LAB — RETICULOCYTES
Immature Retic Fract: 7.6 % (ref 2.3–15.9)
RBC.: 3.86 MIL/uL — ABNORMAL LOW (ref 4.22–5.81)
Retic Count, Absolute: 81.8 10*3/uL (ref 19.0–186.0)
Retic Ct Pct: 2.1 % (ref 0.4–3.1)

## 2018-06-11 MED ORDER — HEPARIN SOD (PORK) LOCK FLUSH 100 UNIT/ML IV SOLN
500.0000 [IU] | Freq: Once | INTRAVENOUS | Status: AC
  Administered 2018-06-11: 500 [IU] via INTRAVENOUS
  Filled 2018-06-11: qty 5

## 2018-06-11 MED ORDER — SODIUM CHLORIDE 0.9% FLUSH
10.0000 mL | INTRAVENOUS | Status: DC | PRN
  Administered 2018-06-11: 10 mL via INTRAVENOUS
  Filled 2018-06-11: qty 10

## 2018-06-11 NOTE — Patient Instructions (Signed)

## 2018-06-11 NOTE — Progress Notes (Signed)
Hematology and Oncology Follow Up Visit  ALP GOLDWATER [id number] 1945-11-13 73 y.o. 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 is here today for follow-up.  He and his wife now are in the assisted living center.  They are in [place].  They really like it there.  He exercises.  They have a gym that he works out in.    He is not having issues with diarrhea.  He is eating well.  He has had no problems with cough or shortness of breath.  His last PET scan was is late December.  Everything looked fine without any evidence of residual melanoma.  He and his wife are going down to [place] in the [place].  They are going down in May.  I want to make sure that we get him back before he goes down.  He does have hemochromatosis.  We have been holding off on being aggressive with this because of his melanoma and the complications that he had with immunotherapy.  The last time we saw him in January, his ferritin was 255 with an iron saturation of 61%.  He has had no rashes.  He has had no fever.  He has had no headache.   Overall, I would say his performance status is ECOG 1.  Medications:  Allergies as of 06/11/2018      Reactions   Cefepime Hives, Shortness Of Breath   Cephaeline Hives   Morphine And Related Shortness Of Breath, Nausea And Vomiting, Swelling, Other (See Comments)   Agitation, tolerates dilaudid Other reaction(s): Other (See Comments) Agitation, tolerates dilaudid   Penicillins Hives, Shortness Of Breath, Rash   Has patient had a PCN reaction causing immediate rash, facial/tongue/throat swelling, SOB or lightheadedness with  hypotension: Yes Has patient had a PCN reaction causing severe rash involving mucus membranes or skin necrosis: Yes Has patient had a PCN reaction that required hospitalization No Has patient had a PCN reaction occurring within the last 10 years: No If all of the above answers are "NO", then may proceed with Cephalosporin use.   Morphine    Oxycodone-acetaminophen    Doxycycline Rash      Medication List       Accurate as of June 11, 2018  4:02 PM. Always use your most recent med list.        albuterol 108 (90 Base) MCG/ACT inhaler Commonly known as:  PROVENTIL HFA;VENTOLIN HFA Inhale 1-2 puffs into the lungs every 4 (four) hours as needed for wheezing or shortness of breath.   alfuzosin 10 MG 24 hr tablet Commonly known as:  UROXATRAL Take 10 mg by mouth at bedtime.   ALPRAZolam 0.5 MG tablet Commonly known as:  XANAX TAKE 1 TABLET BY MOUTH TWICE DAILY AS NEEDED FOR ANXIETY   Cialis 5 MG tablet Generic drug:  tadalafil Take 5 mg by mouth as needed.   clopidogrel 75 MG tablet Commonly known as:  PLAVIX TAKE 1 TABLET BY MOUTH EVERY DAY WITH BREAKFAST   dronabinol 5 MG capsule Commonly known as:  MARINOL Take 1 capsule (5 mg total) by mouth 2 (two) times daily before a meal.   Fish Oil Omega-3 1000 MG Caps  Take 1,000 mg by mouth daily.   FLUoxetine 40 MG capsule Commonly known as:  PROZAC Take 1 capsule (40 mg total) by mouth 2 (two) times daily.   fluticasone 50 MCG/ACT nasal spray Commonly known as:  FLONASE INSTILL 2 SPRAYS IN EACH NOSTRIL EVERY DAY   gabapentin 300 MG capsule Commonly known as:  NEURONTIN Take 1 capsule (300 mg total) by mouth 2 (two) times daily.   HYDROmorphone 2 MG tablet Commonly known as:  DILAUDID Take 1 tablet (2 mg total) by mouth every 8 (eight) hours as needed for severe pain. Please fill on or after 05/15/18   lidocaine-prilocaine cream Commonly known as:  EMLA Apply 1 application topically as needed.     losartan-hydrochlorothiazide 50-12.5 MG tablet Commonly known as:  HYZAAR Take 1 tablet by mouth daily.   Melatonin 5 MG Tabs Take 5 mg by mouth at bedtime. Takes 10 mg at HS   metoprolol tartrate 25 MG tablet Commonly known as:  LOPRESSOR Take 0.5 tablets (12.5 mg total) by mouth 2 (two) times daily.   mupirocin ointment 2 % Commonly known as:  BACTROBAN APPLY AA OF THE SKIN QD   ondansetron 8 MG tablet Commonly known as:  ZOFRAN Take 1 tablet (8 mg total) by mouth 3 (three) times daily.   pantoprazole 40 MG tablet Commonly known as:  PROTONIX Take 1 tablet (40 mg total) by mouth every evening.   potassium chloride SA 20 MEQ tablet Commonly known as:  K-DUR,KLOR-CON DISSOLVE 4 TABLETS IN 4 TO 8 OUNCES OF WATER AND DRINK BY MOUTH ONCE ON DAY 1 THEN DISSOLVE 2 TABLETS AND DRINK ONCE DAILY FOR 10 DAYS   pravastatin 20 MG tablet Commonly known as:  PRAVACHOL Take 1 tablet (20 mg total) by mouth daily.   predniSONE 5 MG tablet Commonly known as:  DELTASONE Take 2 tablets (10 mg total) by mouth daily with breakfast.   prochlorperazine 10 MG tablet Commonly known as:  COMPAZINE TAKE 1 TABLET(10 MG) BY MOUTH EVERY 6 HOURS AS NEEDED FOR NAUSEA OR VOMITING   torsemide 10 MG tablet Commonly known as:  Demadex Take 1-2 tablets (10-20 mg total) by mouth daily.   traZODone 100 MG tablet Commonly known as:  DESYREL TAKE 1 TABLET(100 MG) BY MOUTH AT BEDTIME AS NEEDED FOR SLEEP   vitamin B-12 1000 MCG tablet Commonly known as:  CYANOCOBALAMIN Take 1,000 mcg by mouth daily.   Vitamin D3 50 MCG (2000 UT) capsule Take 1 capsule (2,000 Units total) by mouth daily.       Allergies:  Allergies  Allergen Reactions  . Cefepime Hives and Shortness Of Breath  . Cephaeline Hives  . Morphine And Related Shortness Of Breath, Nausea And Vomiting, Swelling and Other (See Comments)    Agitation, tolerates dilaudid Other reaction(s): Other (See Comments) Agitation, tolerates  dilaudid  . Penicillins Hives, Shortness Of Breath and Rash    Has patient had a PCN reaction causing immediate rash, facial/tongue/throat swelling, SOB or lightheadedness with hypotension: Yes Has patient had a PCN reaction causing severe rash involving mucus membranes or skin necrosis: Yes Has patient had a PCN reaction that required hospitalization No Has patient had a PCN reaction occurring within the last 10 years: No If all of the above answers are "NO", then may proceed with Cephalosporin use.   [name] Morphine   . Oxycodone-Acetaminophen   . Doxycycline Rash    Past Medical History, Surgical history, Social history, and Family History were reviewed and updated.  Review of Systems: Review of Systems  Constitutional: Positive for malaise/fatigue.  HENT: Negative.   Eyes: Negative.   Respiratory: Negative.   Cardiovascular: Negative.   Gastrointestinal: Positive for diarrhea.  Genitourinary: Negative.   Musculoskeletal: Positive for joint pain and neck pain.  Skin: Negative.   Neurological: Negative.   Endo/Heme/Allergies: Negative.       Physical Exam:  weight is 137 lb 12 oz (62.5 kg). His oral temperature is 98.5 F (36.9 C). His blood pressure is 130/60 and his pulse is 77. His respiration is 18 and oxygen saturation is 98%.   Wt Readings from Last 3 Encounters:  06/11/18 137 lb 12 oz (62.5 kg)  05/05/18 138 lb (62.6 kg)  04/21/18 142 lb (64.4 kg)    Physical Exam Vitals signs reviewed.  HENT:     Head: Normocephalic and atraumatic.  Eyes:     Pupils: Pupils are equal, round, and reactive to light.  Neck:     Musculoskeletal: Normal range of motion.  Cardiovascular:     Rate and Rhythm: Normal rate and regular rhythm.     Heart sounds: Normal heart sounds.  Pulmonary:     Effort: Pulmonary effort is normal.     Breath sounds: Normal breath sounds.  Abdominal:     General: Bowel sounds are normal.     Palpations: Abdomen is soft.  Musculoskeletal: Normal  range of motion.        General: No tenderness or deformity.  Lymphadenopathy:     Cervical: No cervical adenopathy.  Skin:    General: Skin is warm and dry.     Findings: No erythema or rash.  Neurological:     Mental Status: He is alert and oriented to person, place, and time.  Psychiatric:        Behavior: Behavior normal.        Thought Content: Thought content normal.        Judgment: Judgment normal.      Lab Results  Component Value Date   WBC 7.1 06/11/2018   HGB 12.5 (L) 06/11/2018   HCT 37.4 (L) 06/11/2018   MCV 96.9 06/11/2018   PLT 217 06/11/2018   Lab Results  Component Value Date   FERRITIN 255 04/16/2018   IRON 102 04/16/2018   TIBC 169 (L) 04/16/2018   UIBC 67 (L) 04/16/2018   IRONPCTSAT 61 (H) 04/16/2018   Lab Results  Component Value Date   RETICCTPCT 2.1 06/11/2018   RBC 3.86 (L) 06/11/2018   RBC 3.86 (L) 06/11/2018   No results found for: KPAFRELGTCHN, LAMBDASER, KAPLAMBRATIO No results found for: IGGSERUM, IGA, IGMSERUM No results found for: TOTALPROTELP, ALBUMINELP, A1GS, A2GS, BETS, BETA2SER, GAMS, MSPIKE, SPEI   Chemistry      Component Value Date/Time   NA 131 (L) 06/11/2018 1430   NA 140 04/10/2017 1046   NA 135 (L) 03/06/2017 1104   K 4.3 06/11/2018 1430   K 3.7 04/10/2017 1046   K 4.2 03/06/2017 1104   CL 96 (L) 06/11/2018 1430   CL 98 04/10/2017 1046   CO2 29 06/11/2018 1430   CO2 31 04/10/2017 1046   CO2 30 (H) 03/06/2017 1104   BUN 12 06/11/2018 1430   BUN 21 04/10/2017 1046   BUN 20.0 03/06/2017 1104   CREATININE 0.86 06/11/2018 1430   CREATININE 0.9 04/10/2017 1046   CREATININE 1.0 03/06/2017 1104      Component Value Date/Time   CALCIUM 9.0 06/11/2018 1430   CALCIUM 8.9 04/10/2017 1046     CALCIUM 9.4 03/06/2017 1104   ALKPHOS 60 06/11/2018 1430   ALKPHOS 65 04/10/2017 1046   ALKPHOS 78 03/06/2017 1104   AST 14 (L) 06/11/2018 1430   AST 20 03/06/2017 1104   ALT 16 06/11/2018 1430   ALT 21 04/10/2017 1046   ALT  20 03/06/2017 1104   BILITOT 0.7 06/11/2018 1430   BILITOT 0.47 03/06/2017 1104       Impression and Plan: Mr. Pinney is a very pleasant 72 yo caucasian gentleman with history of hemochromatosis, homozygous for the C282Y mutation which has not been a problem for him.   I would like to see him back in April.  I will get another PET scan on him before we see him back.  We will have to see what his iron studies look like..    Peter R Ennever, MD 3/5/20204:02 PM 

## 2018-06-12 ENCOUNTER — Telehealth: Payer: Self-pay | Admitting: Hematology & Oncology

## 2018-06-12 ENCOUNTER — Telehealth: Payer: Self-pay | Admitting: *Deleted

## 2018-06-12 DIAGNOSIS — M25561 Pain in right knee: Secondary | ICD-10-CM | POA: Diagnosis not present

## 2018-06-12 DIAGNOSIS — R262 Difficulty in walking, not elsewhere classified: Secondary | ICD-10-CM | POA: Diagnosis not present

## 2018-06-12 DIAGNOSIS — M6281 Muscle weakness (generalized): Secondary | ICD-10-CM | POA: Diagnosis not present

## 2018-06-12 DIAGNOSIS — R2681 Unsteadiness on feet: Secondary | ICD-10-CM | POA: Diagnosis not present

## 2018-06-12 DIAGNOSIS — R278 Other lack of coordination: Secondary | ICD-10-CM | POA: Diagnosis not present

## 2018-06-12 LAB — IRON AND TIBC
Iron: 65 ug/dL (ref 45–182)
Saturation Ratios: 31 % (ref 17.9–39.5)
TIBC: 209 ug/dL — ABNORMAL LOW (ref 250–450)
UIBC: 144 ug/dL

## 2018-06-12 LAB — LACTATE DEHYDROGENASE: LDH: 135 U/L (ref 98–192)

## 2018-06-12 LAB — FERRITIN: Ferritin: 90 ng/mL (ref 24–336)

## 2018-06-12 NOTE — Telephone Encounter (Signed)
Called and spoke with patient regarding appointments date/time per 3/5 los

## 2018-06-12 NOTE — Telephone Encounter (Signed)
Notified pt, no phlebotomy needed.

## 2018-06-12 NOTE — Telephone Encounter (Signed)
-----   Message from [name], MD sent at 06/12/2018  1:44 PM EST ----- Call - the iron is actually good!!  No need for a phlebotomy!!!  Laurey Arrow

## 2018-06-15 DIAGNOSIS — R278 Other lack of coordination: Secondary | ICD-10-CM | POA: Diagnosis not present

## 2018-06-15 DIAGNOSIS — R262 Difficulty in walking, not elsewhere classified: Secondary | ICD-10-CM | POA: Diagnosis not present

## 2018-06-15 DIAGNOSIS — R2681 Unsteadiness on feet: Secondary | ICD-10-CM | POA: Diagnosis not present

## 2018-06-15 DIAGNOSIS — M25561 Pain in right knee: Secondary | ICD-10-CM | POA: Diagnosis not present

## 2018-06-15 DIAGNOSIS — M6281 Muscle weakness (generalized): Secondary | ICD-10-CM | POA: Diagnosis not present

## 2018-06-16 ENCOUNTER — Telehealth: Payer: Self-pay

## 2018-06-16 DIAGNOSIS — R2681 Unsteadiness on feet: Secondary | ICD-10-CM | POA: Diagnosis not present

## 2018-06-16 DIAGNOSIS — M6281 Muscle weakness (generalized): Secondary | ICD-10-CM | POA: Diagnosis not present

## 2018-06-16 DIAGNOSIS — M25561 Pain in right knee: Secondary | ICD-10-CM | POA: Diagnosis not present

## 2018-06-16 DIAGNOSIS — R278 Other lack of coordination: Secondary | ICD-10-CM | POA: Diagnosis not present

## 2018-06-16 DIAGNOSIS — R262 Difficulty in walking, not elsewhere classified: Secondary | ICD-10-CM | POA: Diagnosis not present

## 2018-06-16 NOTE — Telephone Encounter (Signed)
Okay to hold Plavix for 5 days prior to procedure. Candee Furbish, MD

## 2018-06-16 NOTE — Telephone Encounter (Signed)
[facility] Pre-operative Risk Assessment     Request for surgical clearance:     Endoscopy Procedure  What type of surgery is being performed?     colonoscopy  When is this surgery scheduled?     07/27/2018  What type of clearance is required ?   Pharmacy  Are there any medications that need to be held prior to surgery and how long? Plavix 5 day hold  Practice name and name of physician performing surgery?      [facility]  What is your office phone and fax number?      Phone- [phone]  [phone]  Anesthesia type (None, local, MAC, general) ?       MAC

## 2018-06-16 NOTE — Telephone Encounter (Signed)
Spoke to patient. Per Dr Ardis Hughs last office visit note,patient has been scheduled a colonoscopy on 07/27/18 and pre visit 07/20/18. Anti coag letter has been sent to Dr Johnsie Cancel to hold plavix for 5 days prior to procedure.

## 2018-06-16 NOTE — Telephone Encounter (Signed)
Patient notified that his cardiologist cleared him to hold plavix for 5 days prior to 07/27/18 colonoscopy. Patient voiced understanding.

## 2018-06-16 NOTE — Telephone Encounter (Signed)
Dr. Marlou Porch, pt for colonoscopy - last stent 2015, on plavix alone, OK to hold for 5 days.  Not scheduled until 4/20 if you prefer for Dr. Johnsie Cancel to address,

## 2018-06-18 DIAGNOSIS — M47812 Spondylosis without myelopathy or radiculopathy, cervical region: Secondary | ICD-10-CM | POA: Diagnosis not present

## 2018-06-18 DIAGNOSIS — I10 Essential (primary) hypertension: Secondary | ICD-10-CM | POA: Diagnosis not present

## 2018-06-19 DIAGNOSIS — R278 Other lack of coordination: Secondary | ICD-10-CM | POA: Diagnosis not present

## 2018-06-19 DIAGNOSIS — R262 Difficulty in walking, not elsewhere classified: Secondary | ICD-10-CM | POA: Diagnosis not present

## 2018-06-19 DIAGNOSIS — M25561 Pain in right knee: Secondary | ICD-10-CM | POA: Diagnosis not present

## 2018-06-19 DIAGNOSIS — R2681 Unsteadiness on feet: Secondary | ICD-10-CM | POA: Diagnosis not present

## 2018-06-19 DIAGNOSIS — M6281 Muscle weakness (generalized): Secondary | ICD-10-CM | POA: Diagnosis not present

## 2018-06-22 DIAGNOSIS — R2681 Unsteadiness on feet: Secondary | ICD-10-CM | POA: Diagnosis not present

## 2018-06-22 DIAGNOSIS — A6001 Herpesviral infection of penis: Secondary | ICD-10-CM | POA: Diagnosis not present

## 2018-06-22 DIAGNOSIS — M25561 Pain in right knee: Secondary | ICD-10-CM | POA: Diagnosis not present

## 2018-06-22 DIAGNOSIS — M6281 Muscle weakness (generalized): Secondary | ICD-10-CM | POA: Diagnosis not present

## 2018-06-22 DIAGNOSIS — R278 Other lack of coordination: Secondary | ICD-10-CM | POA: Diagnosis not present

## 2018-06-22 DIAGNOSIS — N401 Enlarged prostate with lower urinary tract symptoms: Secondary | ICD-10-CM | POA: Diagnosis not present

## 2018-06-22 DIAGNOSIS — R351 Nocturia: Secondary | ICD-10-CM | POA: Diagnosis not present

## 2018-06-22 DIAGNOSIS — R262 Difficulty in walking, not elsewhere classified: Secondary | ICD-10-CM | POA: Diagnosis not present

## 2018-06-22 DIAGNOSIS — N5201 Erectile dysfunction due to arterial insufficiency: Secondary | ICD-10-CM | POA: Diagnosis not present

## 2018-06-23 ENCOUNTER — Telehealth: Payer: Self-pay | Admitting: *Deleted

## 2018-06-23 DIAGNOSIS — M25561 Pain in right knee: Secondary | ICD-10-CM | POA: Diagnosis not present

## 2018-06-23 DIAGNOSIS — R278 Other lack of coordination: Secondary | ICD-10-CM | POA: Diagnosis not present

## 2018-06-23 DIAGNOSIS — M6281 Muscle weakness (generalized): Secondary | ICD-10-CM | POA: Diagnosis not present

## 2018-06-23 DIAGNOSIS — R262 Difficulty in walking, not elsewhere classified: Secondary | ICD-10-CM | POA: Diagnosis not present

## 2018-06-23 DIAGNOSIS — R2681 Unsteadiness on feet: Secondary | ICD-10-CM | POA: Diagnosis not present

## 2018-06-23 NOTE — Telephone Encounter (Signed)
Callback staff - while we await Dr. Kyla Balzarine response please let the caller below know that clearance is pending MD response. We have received, just waiting on input from MD then we will call pt with screening call.

## 2018-06-23 NOTE — Telephone Encounter (Signed)
     Primary Cardiologist: Jenkins Rouge, MD  Chart reviewed as part of pre-operative protocol coverage. Patient was contacted 06/23/2018 in reference to pre-operative risk assessment for pending surgery as outlined below.  [name] states that his ablation is not to be done until July. It is too early to provide clearance for a procedure 4 months away as clinical status could change.   The patient has his routine 6 month follow up with Dr. Johnsie Cancel 09/28/18. If he is stable at that time with no new changes, it would be OK to hold Plavix for 7 days. Please check after his appointment and closer to the time of the planned procedure.  I will efax this note to the requesting provider and remove from the preop box.   Please call with questions.  Daune Perch, NP 06/23/2018, 4:52 PM

## 2018-06-23 NOTE — Telephone Encounter (Signed)
Pt has been made aware that clearance is pending and waiting for MD response. Pt verbalized understanding and thanked me for the call.

## 2018-06-23 NOTE — Telephone Encounter (Signed)
F/U Message           Helene Kelp with [facility] is calling to check on clearance. Pls fax when done.Fax [phone] Office [phone] ext [phone]

## 2018-06-23 NOTE — Telephone Encounter (Signed)
   [facility] Pre-operative Risk Assessment    Request for surgical clearance:  1. What type of surgery is being performed? CERVICAL SPINE (RFA)   2. When is this surgery scheduled? TBD   3. What type of clearance is required (medical clearance vs. Pharmacy clearance to hold med vs. Both)? MEDICAL  4. Are there any medications that need to be held prior to surgery and how long?PLAVIX X 7 DAYS PRIOR   5. Practice name and name of physician performing surgery? [facility]; DR. HARKINS   6. What is your office phone number [phone]    7.   What is your office fax number [phone]  8.   Anesthesia type (None, local, MAC, general) ? NONE LISTED   Julaine Hua 06/23/2018, 9:02 AM  _________________________________________________________________   (provider comments below)

## 2018-06-23 NOTE — Telephone Encounter (Signed)
Ok to hold plavix for 7 days

## 2018-06-23 NOTE — Telephone Encounter (Signed)
   Primary Cardiologist: Jenkins Rouge, MD  Chart reviewed as part of pre-operative protocol coverage. Patient was contacted 06/23/2018 in reference to pre-operative risk assessment for pending surgery as outlined below.  [name] was last seen on 03/2018 by Dr. Johnsie Cancel - h/o CAD s/p prior PCIs, paroxysmal atrial fibrillation not on systemic anticoagulation given need for DAPT, hyperlipidemia, COPD, GERD, obstructive sleep apnea, rheumatoid arthritis, melanoma, hemochromatosis. He ins maintained on Plavix monotherapy (no ASA).   Will route to Dr. Johnsie Cancel for input on if he feels pt is OK to hold Plavix x 7 days for cervical spine surgery then pt will need phone call.  Dr. Johnsie Cancel, - Please route response to P CV DIV PREOP (the pre-op pool). Thank you.  Charlie Pitter, PA-C 06/23/2018, 9:54 AM

## 2018-06-24 ENCOUNTER — Ambulatory Visit (INDEPENDENT_AMBULATORY_CARE_PROVIDER_SITE_OTHER): Payer: Medicare Other | Admitting: Internal Medicine

## 2018-06-24 ENCOUNTER — Encounter: Payer: Self-pay | Admitting: Internal Medicine

## 2018-06-24 ENCOUNTER — Other Ambulatory Visit: Payer: Self-pay

## 2018-06-24 DIAGNOSIS — I10 Essential (primary) hypertension: Secondary | ICD-10-CM

## 2018-06-24 DIAGNOSIS — I2510 Atherosclerotic heart disease of native coronary artery without angina pectoris: Secondary | ICD-10-CM | POA: Diagnosis not present

## 2018-06-24 DIAGNOSIS — M05711 Rheumatoid arthritis with rheumatoid factor of right shoulder without organ or systems involvement: Secondary | ICD-10-CM | POA: Diagnosis not present

## 2018-06-24 DIAGNOSIS — F419 Anxiety disorder, unspecified: Secondary | ICD-10-CM

## 2018-06-24 MED ORDER — HYDROMORPHONE HCL 2 MG PO TABS: 2.0000 mg | ORAL_TABLET | Freq: Three times a day (TID) | ORAL | 0 refills | Status: DC | PRN

## 2018-06-24 MED ORDER — TRAZODONE HCL 100 MG PO TABS: ORAL_TABLET | ORAL | 11 refills | Status: DC

## 2018-06-24 NOTE — Assessment & Plan Note (Signed)
  Xanax prn  Potential benefits of a long term benzodiazepines  use as well as potential risks  and complications were explained to the patient and were aknowledged. Not to take w/dilaudid

## 2018-06-24 NOTE — Assessment & Plan Note (Signed)
Dilaudid Chronic pain  Potential benefits of a long term opioids use as well as potential risks (i.e. addiction risk, apnea etc) and complications (i.e. Somnolence, constipation and others) were explained to the patient and were aknowledged.

## 2018-06-24 NOTE — Assessment & Plan Note (Signed)
Lopressor, Plavix

## 2018-06-24 NOTE — Progress Notes (Signed)
Subjective:  Patient ID: [name], male    DOB: Nov 17, 1945  Age: 73 y.o. MRN: [id number]  CC: No chief complaint on file.   HPI [name] presents for RA, chronic pain, depression f/u. Better w/PT  Outpatient Medications Prior to Visit  Medication Sig Dispense Refill   albuterol (PROVENTIL HFA;VENTOLIN HFA) 108 (90 Base) MCG/ACT inhaler Inhale 1-2 puffs into the lungs every 4 (four) hours as needed for wheezing or shortness of breath. 1 Inhaler 1   alfuzosin (UROXATRAL) 10 MG 24 hr tablet Take 10 mg by mouth at bedtime.   0   ALPRAZolam (XANAX) 0.5 MG tablet TAKE 1 TABLET BY MOUTH TWICE DAILY AS NEEDED FOR ANXIETY 60 tablet 3   Cholecalciferol (VITAMIN D3) 2000 UNITS capsule Take 1 capsule (2,000 Units total) by mouth daily. 100 capsule 3   clopidogrel (PLAVIX) 75 MG tablet TAKE 1 TABLET BY MOUTH EVERY DAY WITH BREAKFAST 90 tablet 3   dronabinol (MARINOL) 5 MG capsule Take 1 capsule (5 mg total) by mouth 2 (two) times daily before a meal. 60 capsule 2   FLUoxetine (PROZAC) 40 MG capsule Take 1 capsule (40 mg total) by mouth 2 (two) times daily. 180 capsule 0   fluticasone (FLONASE) 50 MCG/ACT nasal spray INSTILL 2 SPRAYS IN EACH NOSTRIL EVERY DAY 16 g 11   gabapentin (NEURONTIN) 300 MG capsule Take 1 capsule (300 mg total) by mouth 2 (two) times daily. 180 capsule 1   HYDROmorphone (DILAUDID) 2 MG tablet Take 1 tablet (2 mg total) by mouth every 8 (eight) hours as needed for severe pain. Please fill on or after 05/15/18 120 tablet 0   lidocaine-prilocaine (EMLA) cream Apply 1 application topically as needed. 30 g 5   losartan-hydrochlorothiazide (HYZAAR) 50-12.5 MG tablet Take 1 tablet by mouth daily. 90 tablet 3   Melatonin 5 MG TABS Take 5 mg by mouth at bedtime. Takes 10 mg at HS     metoprolol tartrate (LOPRESSOR) 25 MG tablet Take 0.5 tablets (12.5 mg total) by mouth 2 (two) times daily. 180 tablet 1   mupirocin ointment (BACTROBAN) 2 % APPLY AA OF THE  SKIN QD  0   Omega-3 Fatty Acids (FISH OIL OMEGA-3) 1000 MG CAPS Take 1,000 mg by mouth daily.      ondansetron (ZOFRAN) 8 MG tablet Take 1 tablet (8 mg total) by mouth 3 (three) times daily. 90 tablet 3   pantoprazole (PROTONIX) 40 MG tablet Take 1 tablet (40 mg total) by mouth every evening. 90 tablet 1   potassium chloride SA (K-DUR,KLOR-CON) 20 MEQ tablet DISSOLVE 4 TABLETS IN 4 TO 8 OUNCES OF WATER AND DRINK BY MOUTH ONCE ON DAY 1 THEN DISSOLVE 2 TABLETS AND DRINK ONCE DAILY FOR 10 DAYS 216 tablet 0   pravastatin (PRAVACHOL) 20 MG tablet Take 1 tablet (20 mg total) by mouth daily. (Patient taking differently: Take 20 mg by mouth every evening. ) 90 tablet 1   predniSONE (DELTASONE) 5 MG tablet Take 2 tablets (10 mg total) by mouth daily with breakfast. 90 tablet 2   prochlorperazine (COMPAZINE) 10 MG tablet TAKE 1 TABLET(10 MG) BY MOUTH EVERY 6 HOURS AS NEEDED FOR NAUSEA OR VOMITING 385 tablet 1   tadalafil (CIALIS) 5 MG tablet Take 5 mg by mouth as needed.     torsemide (DEMADEX) 10 MG tablet Take 1-2 tablets (10-20 mg total) by mouth daily. 60 tablet 3   traZODone (DESYREL) 100 MG tablet TAKE 1 TABLET(100 MG) BY  MOUTH AT BEDTIME AS NEEDED FOR SLEEP 30 tablet 5   vitamin B-12 (CYANOCOBALAMIN) 1000 MCG tablet Take 1,000 mcg by mouth daily.      No facility-administered medications prior to visit.     ROS: Review of Systems  Constitutional: Positive for fatigue. Negative for appetite change and unexpected weight change.  HENT: Negative for congestion, nosebleeds, sneezing, sore throat and trouble swallowing.   Eyes: Negative for itching and visual disturbance.  Respiratory: Negative for cough.   Cardiovascular: Negative for chest pain, palpitations and leg swelling.  Gastrointestinal: Negative for abdominal distention, blood in stool, diarrhea and nausea.  Genitourinary: Negative for frequency and hematuria.  Musculoskeletal: Positive for arthralgias, back pain, gait problem,  neck pain and neck stiffness. Negative for joint swelling.  Skin: Negative for rash.  Neurological: Negative for dizziness, tremors, speech difficulty and weakness.  Psychiatric/Behavioral: Negative for agitation, dysphoric mood, sleep disturbance and suicidal ideas. The patient is nervous/anxious.     Objective:  BP 112/62 (BP Location: Left Arm, Patient Position: Sitting, Cuff Size: Normal)    Pulse 69    Temp 98.3 F (36.8 C) (Oral)    Ht 5' 6"  (1.676 m)    Wt 137 lb (62.1 kg)    SpO2 94%    BMI 22.11 kg/m   BP Readings from Last 3 Encounters:  06/24/18 112/62  06/11/18 130/60  05/05/18 130/60    Wt Readings from Last 3 Encounters:  06/24/18 137 lb (62.1 kg)  06/11/18 137 lb 12 oz (62.5 kg)  05/05/18 138 lb (62.6 kg)    Physical Exam Constitutional:      General: He is not in acute distress.    Appearance: He is well-developed.     Comments: NAD  Eyes:     Conjunctiva/sclera: Conjunctivae normal.     Pupils: Pupils are equal, round, and reactive to light.  Neck:     Musculoskeletal: Normal range of motion.     Thyroid: No thyromegaly.     Vascular: No JVD.  Cardiovascular:     Rate and Rhythm: Normal rate and regular rhythm.     Heart sounds: Normal heart sounds. No murmur. No friction rub. No gallop.   Pulmonary:     Effort: Pulmonary effort is normal. No respiratory distress.     Breath sounds: Normal breath sounds. No wheezing or rales.  Chest:     Chest wall: No tenderness.  Abdominal:     General: Bowel sounds are normal. There is no distension.     Palpations: Abdomen is soft. There is no mass.     Tenderness: There is no abdominal tenderness. There is no guarding or rebound.  Musculoskeletal: Normal range of motion.        General: Tenderness present.  Lymphadenopathy:     Cervical: No cervical adenopathy.  Skin:    General: Skin is warm and dry.     Findings: No rash.  Neurological:     Mental Status: He is alert and oriented to person, place, and  time.     Cranial Nerves: No cranial nerve deficit.     Motor: No abnormal muscle tone.     Coordination: Coordination normal.     Gait: Gait normal.     Deep Tendon Reflexes: Reflexes are normal and symmetric.  Psychiatric:        Behavior: Behavior normal.        Thought Content: Thought content normal.        Judgment: Judgment normal.  LS, cerv spine w/pain Arthritic gait   Lab Results  Component Value Date   WBC 7.1 06/11/2018   HGB 12.5 (L) 06/11/2018   HCT 37.4 (L) 06/11/2018   PLT 217 06/11/2018   GLUCOSE 129 (H) 06/11/2018   CHOL 128 03/10/2015   TRIG 95.0 03/10/2015   HDL 39.30 03/10/2015   LDLCALC 70 03/10/2015   ALT 16 06/11/2018   AST 14 (L) 06/11/2018   NA 131 (L) 06/11/2018   K 4.3 06/11/2018   CL 96 (L) 06/11/2018   CREATININE 0.86 06/11/2018   BUN 12 06/11/2018   CO2 29 06/11/2018   TSH 0.680 09/18/2017   PSA 1.95 10/15/2010   INR 1.1 (H) 06/14/2014   HGBA1C 6.0 09/28/2015    Nm Pet Image Restage (ps) Whole Body  Result Date: 04/03/2018 CLINICAL DATA:  Subsequent treatment strategy for 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 EXAM: NUCLEAR MEDICINE PET WHOLE BODY TECHNIQUE: 6.6 mCi F-18 FDG was injected intravenously. Full-ring PET imaging was performed from the skull base to thigh after the radiotracer. CT data was obtained and used for attenuation correction and anatomic localization. Fasting blood glucose: 87 mg/dl COMPARISON:  PET-CT dated 03/07/2017. CT chest abdomen pelvis dated 10/14/2017. FINDINGS: Mediastinal blood pool activity: SUV max 1.8 HEAD/NECK: No hypermetabolic activity on the scalp. No hypermetabolic cervical lymphadenopathy. Incidental CT findings: none CHEST: No suspicious pulmonary nodules. No hypermetabolic thoracic lymphadenopathy. Stable eventration of the right hemidiaphragm with associated scarring/atelectasis in the superior segment right lower lobe, non FDG avid. Incidental CT findings:  Atherosclerotic calcifications of the aortic arch. Three vessel coronary atherosclerosis. Left chest port terminates in the mid SVC. ABDOMEN/PELVIS: No abnormal hypermetabolism in the liver spleen, pancreas, or adrenal glands. No suspicious abdominopelvic lymphadenopathy. Status post left inguinal lymph node dissection with mild postsurgical changes. No residual lymphadenopathy or hypermetabolism. Probable sebaceous cyst along the right anterior abdominal wall (series 5/image 152), non FDG avid. Incidental CT findings: Bilateral renal cysts. Atherosclerotic calcifications the abdominal aorta and branch vessels. Left colonic diverticulosis, without evidence of diverticulitis. Bladder is mildly thick-walled although underdistended. SKELETON: No focal hypermetabolic activity to suggest skeletal metastasis. Incidental CT findings: Status post posterior lumbar fixation at L1-S1, with stable fracture of the S1 pedicle screws (series 5/image 175). Degenerative changes of the visualized thoracolumbar spine. EXTREMITIES: No abnormal hypermetabolic activity in the lower extremities. Incidental CT findings: Bilateral shoulder arthroplasties, without evidence of complication. Status post left knee arthroplasty. Status post medial compartment right knee hemiarthroplasty. IMPRESSION: Status post left inguinal lymph node dissection with mild postsurgical changes. No residual lymphadenopathy hypermetabolism. No findings suspicious for recurrent or metastatic disease. Additional stable ancillary findings as above. Electronically Signed   By: Julian Hy M.D.   On: 04/03/2018 10:32    Assessment & Plan:   There are no diagnoses linked to this encounter.   No orders of the defined types were placed in this encounter.    Follow-up: No follow-ups on file.  [name], MD

## 2018-06-24 NOTE — Assessment & Plan Note (Signed)
Lopressor and Losartan HCT

## 2018-06-25 ENCOUNTER — Telehealth: Payer: Self-pay | Admitting: Hematology & Oncology

## 2018-06-25 NOTE — Telephone Encounter (Signed)
PET Scan scheduled patient notified and instructions were given NPO 6 hrs and arrival time @10 :30

## 2018-06-26 DIAGNOSIS — M6281 Muscle weakness (generalized): Secondary | ICD-10-CM | POA: Diagnosis not present

## 2018-06-26 DIAGNOSIS — R278 Other lack of coordination: Secondary | ICD-10-CM | POA: Diagnosis not present

## 2018-06-26 DIAGNOSIS — R262 Difficulty in walking, not elsewhere classified: Secondary | ICD-10-CM | POA: Diagnosis not present

## 2018-06-26 DIAGNOSIS — R2681 Unsteadiness on feet: Secondary | ICD-10-CM | POA: Diagnosis not present

## 2018-06-26 DIAGNOSIS — M25561 Pain in right knee: Secondary | ICD-10-CM | POA: Diagnosis not present

## 2018-06-26 NOTE — Telephone Encounter (Signed)
° ° °  Please fax pre op clearance letter to Fax [phone]

## 2018-06-29 DIAGNOSIS — R262 Difficulty in walking, not elsewhere classified: Secondary | ICD-10-CM | POA: Diagnosis not present

## 2018-06-29 DIAGNOSIS — M6281 Muscle weakness (generalized): Secondary | ICD-10-CM | POA: Diagnosis not present

## 2018-06-29 DIAGNOSIS — R278 Other lack of coordination: Secondary | ICD-10-CM | POA: Diagnosis not present

## 2018-06-29 DIAGNOSIS — R2681 Unsteadiness on feet: Secondary | ICD-10-CM | POA: Diagnosis not present

## 2018-06-29 DIAGNOSIS — M25561 Pain in right knee: Secondary | ICD-10-CM | POA: Diagnosis not present

## 2018-06-29 NOTE — Telephone Encounter (Signed)
   Primary Cardiologist: Jenkins Rouge, MD  Chart reviewed as part of pre-operative protocol coverage. Patient was contacted 06/23/2018 in reference to pre-operative risk assessment for pending surgery as outlined below.  CAMERON SCHWINN was last seen on 03/23/2018 by Dr. Johnsie Cancel.  Since that day, CATHY ROPP has done well with no new cardiac complaints. Pt has hx of CAD with DES in 2016 and 2006 and PAF not on systemic anticoagulation given need for DAPT.   Therefore, based on ACC/AHA guidelines, the patient would be at acceptable risk for the planned procedure without further cardiovascular testing.   Per Dr. Johnsie Cancel, [place] to hold Plavix for 7 days prior to procedure.   Please note that if the procedure gets pushed out more than 2 months due to the current Covid 19 issues, please reach out to update the clearance as clinical status can change.   I will route this recommendation to the requesting party via Epic fax function and remove from pre-op pool.  Please call with questions.  Daune Perch, NP 06/29/2018, 9:14 AM

## 2018-06-30 DIAGNOSIS — R262 Difficulty in walking, not elsewhere classified: Secondary | ICD-10-CM | POA: Diagnosis not present

## 2018-06-30 DIAGNOSIS — R278 Other lack of coordination: Secondary | ICD-10-CM | POA: Diagnosis not present

## 2018-06-30 DIAGNOSIS — M25561 Pain in right knee: Secondary | ICD-10-CM | POA: Diagnosis not present

## 2018-06-30 DIAGNOSIS — R2681 Unsteadiness on feet: Secondary | ICD-10-CM | POA: Diagnosis not present

## 2018-06-30 DIAGNOSIS — M6281 Muscle weakness (generalized): Secondary | ICD-10-CM | POA: Diagnosis not present

## 2018-07-02 DIAGNOSIS — R278 Other lack of coordination: Secondary | ICD-10-CM | POA: Diagnosis not present

## 2018-07-02 DIAGNOSIS — R262 Difficulty in walking, not elsewhere classified: Secondary | ICD-10-CM | POA: Diagnosis not present

## 2018-07-02 DIAGNOSIS — M25561 Pain in right knee: Secondary | ICD-10-CM | POA: Diagnosis not present

## 2018-07-02 DIAGNOSIS — M6281 Muscle weakness (generalized): Secondary | ICD-10-CM | POA: Diagnosis not present

## 2018-07-02 DIAGNOSIS — R2681 Unsteadiness on feet: Secondary | ICD-10-CM | POA: Diagnosis not present

## 2018-07-06 DIAGNOSIS — R262 Difficulty in walking, not elsewhere classified: Secondary | ICD-10-CM | POA: Diagnosis not present

## 2018-07-06 DIAGNOSIS — M25561 Pain in right knee: Secondary | ICD-10-CM | POA: Diagnosis not present

## 2018-07-06 DIAGNOSIS — R2681 Unsteadiness on feet: Secondary | ICD-10-CM | POA: Diagnosis not present

## 2018-07-06 DIAGNOSIS — R278 Other lack of coordination: Secondary | ICD-10-CM | POA: Diagnosis not present

## 2018-07-06 DIAGNOSIS — M6281 Muscle weakness (generalized): Secondary | ICD-10-CM | POA: Diagnosis not present

## 2018-07-07 ENCOUNTER — Other Ambulatory Visit: Payer: Self-pay | Admitting: Internal Medicine

## 2018-07-07 DIAGNOSIS — R278 Other lack of coordination: Secondary | ICD-10-CM | POA: Diagnosis not present

## 2018-07-07 DIAGNOSIS — R262 Difficulty in walking, not elsewhere classified: Secondary | ICD-10-CM | POA: Diagnosis not present

## 2018-07-07 DIAGNOSIS — M25561 Pain in right knee: Secondary | ICD-10-CM | POA: Diagnosis not present

## 2018-07-07 DIAGNOSIS — R2681 Unsteadiness on feet: Secondary | ICD-10-CM | POA: Diagnosis not present

## 2018-07-07 DIAGNOSIS — M6281 Muscle weakness (generalized): Secondary | ICD-10-CM | POA: Diagnosis not present

## 2018-07-08 DIAGNOSIS — R278 Other lack of coordination: Secondary | ICD-10-CM | POA: Diagnosis not present

## 2018-07-08 DIAGNOSIS — M25561 Pain in right knee: Secondary | ICD-10-CM | POA: Diagnosis not present

## 2018-07-08 DIAGNOSIS — M6281 Muscle weakness (generalized): Secondary | ICD-10-CM | POA: Diagnosis not present

## 2018-07-08 DIAGNOSIS — R2681 Unsteadiness on feet: Secondary | ICD-10-CM | POA: Diagnosis not present

## 2018-07-08 DIAGNOSIS — R262 Difficulty in walking, not elsewhere classified: Secondary | ICD-10-CM | POA: Diagnosis not present

## 2018-07-09 DIAGNOSIS — R278 Other lack of coordination: Secondary | ICD-10-CM | POA: Diagnosis not present

## 2018-07-09 DIAGNOSIS — R2681 Unsteadiness on feet: Secondary | ICD-10-CM | POA: Diagnosis not present

## 2018-07-09 DIAGNOSIS — R262 Difficulty in walking, not elsewhere classified: Secondary | ICD-10-CM | POA: Diagnosis not present

## 2018-07-09 DIAGNOSIS — M6281 Muscle weakness (generalized): Secondary | ICD-10-CM | POA: Diagnosis not present

## 2018-07-09 DIAGNOSIS — M25561 Pain in right knee: Secondary | ICD-10-CM | POA: Diagnosis not present

## 2018-07-10 DIAGNOSIS — R262 Difficulty in walking, not elsewhere classified: Secondary | ICD-10-CM | POA: Diagnosis not present

## 2018-07-10 DIAGNOSIS — R278 Other lack of coordination: Secondary | ICD-10-CM | POA: Diagnosis not present

## 2018-07-10 DIAGNOSIS — M6281 Muscle weakness (generalized): Secondary | ICD-10-CM | POA: Diagnosis not present

## 2018-07-10 DIAGNOSIS — M25561 Pain in right knee: Secondary | ICD-10-CM | POA: Diagnosis not present

## 2018-07-10 DIAGNOSIS — R2681 Unsteadiness on feet: Secondary | ICD-10-CM | POA: Diagnosis not present

## 2018-07-13 DIAGNOSIS — R262 Difficulty in walking, not elsewhere classified: Secondary | ICD-10-CM | POA: Diagnosis not present

## 2018-07-13 DIAGNOSIS — R278 Other lack of coordination: Secondary | ICD-10-CM | POA: Diagnosis not present

## 2018-07-13 DIAGNOSIS — R2681 Unsteadiness on feet: Secondary | ICD-10-CM | POA: Diagnosis not present

## 2018-07-13 DIAGNOSIS — M6281 Muscle weakness (generalized): Secondary | ICD-10-CM | POA: Diagnosis not present

## 2018-07-13 DIAGNOSIS — M25561 Pain in right knee: Secondary | ICD-10-CM | POA: Diagnosis not present

## 2018-07-14 ENCOUNTER — Other Ambulatory Visit: Payer: Self-pay

## 2018-07-14 ENCOUNTER — Ambulatory Visit (HOSPITAL_COMMUNITY)
Admission: RE | Admit: 2018-07-14 | Discharge: 2018-07-14 | Disposition: A | Discharge status: Home / Self Care | Payer: Medicare Other | Source: Ambulatory Visit | Attending: Hematology & Oncology | Admitting: Hematology & Oncology

## 2018-07-14 DIAGNOSIS — I2510 Atherosclerotic heart disease of native coronary artery without angina pectoris: Secondary | ICD-10-CM | POA: Insufficient documentation

## 2018-07-14 DIAGNOSIS — R278 Other lack of coordination: Secondary | ICD-10-CM | POA: Diagnosis not present

## 2018-07-14 DIAGNOSIS — R918 Other nonspecific abnormal finding of lung field: Secondary | ICD-10-CM | POA: Diagnosis not present

## 2018-07-14 DIAGNOSIS — R262 Difficulty in walking, not elsewhere classified: Secondary | ICD-10-CM | POA: Diagnosis not present

## 2018-07-14 DIAGNOSIS — I700 Atherosclerosis of aorta: Secondary | ICD-10-CM | POA: Insufficient documentation

## 2018-07-14 DIAGNOSIS — C4372 Malignant melanoma of left lower limb, including hip: Secondary | ICD-10-CM

## 2018-07-14 DIAGNOSIS — M25561 Pain in right knee: Secondary | ICD-10-CM | POA: Diagnosis not present

## 2018-07-14 DIAGNOSIS — M6281 Muscle weakness (generalized): Secondary | ICD-10-CM | POA: Diagnosis not present

## 2018-07-14 DIAGNOSIS — C439 Malignant melanoma of skin, unspecified: Secondary | ICD-10-CM | POA: Diagnosis not present

## 2018-07-14 DIAGNOSIS — R2681 Unsteadiness on feet: Secondary | ICD-10-CM | POA: Diagnosis not present

## 2018-07-14 LAB — GLUCOSE, CAPILLARY: Glucose-Capillary: 81 mg/dL (ref 70–99)

## 2018-07-14 MED ORDER — FLUDEOXYGLUCOSE F - 18 (FDG) INJECTION
6.7600 | Freq: Once | INTRAVENOUS | Status: AC | PRN
  Administered 2018-07-14: 11:00:00 6.76 via INTRAVENOUS

## 2018-07-15 ENCOUNTER — Telehealth: Payer: Self-pay | Admitting: *Deleted

## 2018-07-15 DIAGNOSIS — M25561 Pain in right knee: Secondary | ICD-10-CM | POA: Diagnosis not present

## 2018-07-15 DIAGNOSIS — R262 Difficulty in walking, not elsewhere classified: Secondary | ICD-10-CM | POA: Diagnosis not present

## 2018-07-15 DIAGNOSIS — R2681 Unsteadiness on feet: Secondary | ICD-10-CM | POA: Diagnosis not present

## 2018-07-15 DIAGNOSIS — M6281 Muscle weakness (generalized): Secondary | ICD-10-CM | POA: Diagnosis not present

## 2018-07-15 DIAGNOSIS — R278 Other lack of coordination: Secondary | ICD-10-CM | POA: Diagnosis not present

## 2018-07-15 NOTE — Telephone Encounter (Signed)
Called patient to confirm PV scheduled 07/20/18 and current insurance...need to reschedule colonoscopy from 07/27/18 to a date after 08/24/18.

## 2018-07-15 NOTE — Telephone Encounter (Signed)
2nd attempt to reach patient to verify confirm PV scheduled 07/20/18 and current insurance. Need to reschedule colonoscopy from 07/27/18 to a date after 08/24/18. Asked pt to return call to [phone].

## 2018-07-15 NOTE — Telephone Encounter (Signed)
-----   Message from [name], MD sent at 07/15/2018  6:58 AM EDT ----- Call - No evidence of recurrent melanoma!!  [name]

## 2018-07-15 NOTE — Telephone Encounter (Signed)
Patient notified per order of Dr. Marin Olp that there is no evidence of recurrent melanoma.  Pt appreciative of call and has no questions at this time.

## 2018-07-16 ENCOUNTER — Telehealth: Payer: Self-pay | Admitting: Internal Medicine

## 2018-07-16 ENCOUNTER — Other Ambulatory Visit: Payer: Self-pay | Admitting: Internal Medicine

## 2018-07-16 DIAGNOSIS — M25561 Pain in right knee: Secondary | ICD-10-CM | POA: Diagnosis not present

## 2018-07-16 DIAGNOSIS — R278 Other lack of coordination: Secondary | ICD-10-CM | POA: Diagnosis not present

## 2018-07-16 DIAGNOSIS — R2681 Unsteadiness on feet: Secondary | ICD-10-CM | POA: Diagnosis not present

## 2018-07-16 DIAGNOSIS — M6281 Muscle weakness (generalized): Secondary | ICD-10-CM | POA: Diagnosis not present

## 2018-07-16 DIAGNOSIS — R262 Difficulty in walking, not elsewhere classified: Secondary | ICD-10-CM | POA: Diagnosis not present

## 2018-07-16 NOTE — Telephone Encounter (Signed)
Requested medication (s) are due for refill today - yes  Requested medication (s) are on the active medication list -yes  Future visit scheduled -yes  Last refill: 06/24/18  Notes to clinic: Patient is requesting a non delegated Rx- sent for PCP review- Rx states may fill after 07/14/18  Requested Prescriptions  Pending Prescriptions Disp Refills   HYDROmorphone (DILAUDID) 2 MG tablet 90 tablet 0    Sig: Take 1 tablet (2 mg total) by mouth every 8 (eight) hours as needed for severe pain.     Not Delegated - Analgesics:  Opioid Agonists Failed - 07/16/2018  3:57 PM      Failed - This refill cannot be delegated      Failed - Urine Drug Screen completed in last 360 days.      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     Not Delegated - Analgesics:  Opioid Agonists Failed - 07/16/2018  3:57 PM      Failed - This refill cannot be delegated      Failed - Urine Drug Screen  completed in last 360 days.      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

## 2018-07-16 NOTE — Telephone Encounter (Signed)
Patient was called to reschedule colonoscopy that is scheduled for 07/27/18. No answer. Left message to call and reschedule after 08/24/18. I also wanted to confirm PV by phone on 07/20/18.   Riki Sheer, LPN ( PV )

## 2018-07-16 NOTE — Telephone Encounter (Signed)
Pt left a vm stating that he previously spoke with Dr. Darius Bump about one of his medication he takes causes him some diarrhea. Per pt he could not remember which med it was, but would like to know the answer due to the diarrhea being back.

## 2018-07-17 DIAGNOSIS — M25561 Pain in right knee: Secondary | ICD-10-CM | POA: Diagnosis not present

## 2018-07-17 DIAGNOSIS — R2681 Unsteadiness on feet: Secondary | ICD-10-CM | POA: Diagnosis not present

## 2018-07-17 DIAGNOSIS — R262 Difficulty in walking, not elsewhere classified: Secondary | ICD-10-CM | POA: Diagnosis not present

## 2018-07-17 DIAGNOSIS — R278 Other lack of coordination: Secondary | ICD-10-CM | POA: Diagnosis not present

## 2018-07-17 DIAGNOSIS — M6281 Muscle weakness (generalized): Secondary | ICD-10-CM | POA: Diagnosis not present

## 2018-07-20 ENCOUNTER — Other Ambulatory Visit: Payer: Self-pay | Admitting: Internal Medicine

## 2018-07-20 NOTE — Telephone Encounter (Signed)
Pt called in to follow up on his concern from 07/16/18.    Please advise.

## 2018-07-21 DIAGNOSIS — M25561 Pain in right knee: Secondary | ICD-10-CM | POA: Diagnosis not present

## 2018-07-21 DIAGNOSIS — R278 Other lack of coordination: Secondary | ICD-10-CM | POA: Diagnosis not present

## 2018-07-21 DIAGNOSIS — R262 Difficulty in walking, not elsewhere classified: Secondary | ICD-10-CM | POA: Diagnosis not present

## 2018-07-21 DIAGNOSIS — M6281 Muscle weakness (generalized): Secondary | ICD-10-CM | POA: Diagnosis not present

## 2018-07-21 DIAGNOSIS — R2681 Unsteadiness on feet: Secondary | ICD-10-CM | POA: Diagnosis not present

## 2018-07-21 NOTE — Telephone Encounter (Signed)
Try Imodium AD 1-2 po qid prn thx

## 2018-07-21 NOTE — Telephone Encounter (Signed)
Pt.notified

## 2018-07-21 NOTE — Telephone Encounter (Signed)
Lomotil?

## 2018-07-22 ENCOUNTER — Inpatient Hospital Stay (HOSPITAL_BASED_OUTPATIENT_CLINIC_OR_DEPARTMENT_OTHER): Payer: Medicare Other | Admitting: Family

## 2018-07-22 ENCOUNTER — Inpatient Hospital Stay: Payer: Medicare Other

## 2018-07-22 ENCOUNTER — Inpatient Hospital Stay: Payer: Medicare Other | Attending: Hematology & Oncology

## 2018-07-22 ENCOUNTER — Encounter: Payer: Self-pay | Admitting: Family

## 2018-07-22 ENCOUNTER — Telehealth: Payer: Self-pay | Admitting: Family

## 2018-07-22 ENCOUNTER — Other Ambulatory Visit: Payer: Self-pay

## 2018-07-22 VITALS — BP 131/70 | HR 60 | Temp 97.5°F | Resp 18 | Ht 66.0 in | Wt 137.7 lb

## 2018-07-22 DIAGNOSIS — R262 Difficulty in walking, not elsewhere classified: Secondary | ICD-10-CM | POA: Diagnosis not present

## 2018-07-22 DIAGNOSIS — R143 Flatulence: Secondary | ICD-10-CM | POA: Diagnosis not present

## 2018-07-22 DIAGNOSIS — R197 Diarrhea, unspecified: Secondary | ICD-10-CM | POA: Diagnosis not present

## 2018-07-22 DIAGNOSIS — M25561 Pain in right knee: Secondary | ICD-10-CM | POA: Diagnosis not present

## 2018-07-22 DIAGNOSIS — R278 Other lack of coordination: Secondary | ICD-10-CM | POA: Diagnosis not present

## 2018-07-22 DIAGNOSIS — C4372 Malignant melanoma of left lower limb, including hip: Secondary | ICD-10-CM | POA: Diagnosis not present

## 2018-07-22 DIAGNOSIS — R2681 Unsteadiness on feet: Secondary | ICD-10-CM | POA: Diagnosis not present

## 2018-07-22 DIAGNOSIS — M6281 Muscle weakness (generalized): Secondary | ICD-10-CM | POA: Diagnosis not present

## 2018-07-22 LAB — CBC WITH DIFFERENTIAL (CANCER CENTER ONLY)
Abs Immature Granulocytes: 0.05 10*3/uL (ref 0.00–0.07)
Basophils Absolute: 0 10*3/uL (ref 0.0–0.1)
Basophils Relative: 0 %
Eosinophils Absolute: 0 10*3/uL (ref 0.0–0.5)
Eosinophils Relative: 0 %
HCT: 37.5 % — ABNORMAL LOW (ref 39.0–52.0)
Hemoglobin: 12.3 g/dL — ABNORMAL LOW (ref 13.0–17.0)
Immature Granulocytes: 1 %
Lymphocytes Relative: 10 %
Lymphs Abs: 0.8 10*3/uL (ref 0.7–4.0)
MCH: 31.1 pg (ref 26.0–34.0)
MCHC: 32.8 g/dL (ref 30.0–36.0)
MCV: 94.9 fL (ref 80.0–100.0)
Monocytes Absolute: 0.8 10*3/uL (ref 0.1–1.0)
Monocytes Relative: 10 %
Neutro Abs: 5.9 10*3/uL (ref 1.7–7.7)
Neutrophils Relative %: 79 %
Platelet Count: 257 10*3/uL (ref 150–400)
RBC: 3.95 MIL/uL — ABNORMAL LOW (ref 4.22–5.81)
RDW: 12.8 % (ref 11.5–15.5)
WBC Count: 7.5 10*3/uL (ref 4.0–10.5)
nRBC: 0 % (ref 0.0–0.2)

## 2018-07-22 LAB — CMP (CANCER CENTER ONLY)
ALT: 13 U/L (ref 0–44)
AST: 10 U/L — ABNORMAL LOW (ref 15–41)
Albumin: 3.5 g/dL (ref 3.5–5.0)
Alkaline Phosphatase: 53 U/L (ref 38–126)
Anion gap: 5 (ref 5–15)
BUN: 14 mg/dL (ref 8–23)
CO2: 31 mmol/L (ref 22–32)
Calcium: 8.9 mg/dL (ref 8.9–10.3)
Chloride: 99 mmol/L (ref 98–111)
Creatinine: 1.12 mg/dL (ref 0.61–1.24)
GFR, Est AFR Am: >60 mL/min (ref >60)
GFR, Estimated: >60 mL/min (ref >60)
Glucose, Bld: 103 mg/dL — ABNORMAL HIGH (ref 70–99)
Potassium: 3.3 mmol/L — ABNORMAL LOW (ref 3.5–5.1)
Sodium: 135 mmol/L (ref 135–145)
Total Bilirubin: 0.5 mg/dL (ref 0.3–1.2)
Total Protein: 5.9 g/dL — ABNORMAL LOW (ref 6.5–8.1)

## 2018-07-22 LAB — LACTATE DEHYDROGENASE: LDH: 148 U/L (ref 98–192)

## 2018-07-22 NOTE — Progress Notes (Signed)
Labs drawn peripherally by lab.

## 2018-07-22 NOTE — Progress Notes (Signed)
Hematology and Oncology Follow Up Visit  [name] [id number] 10-29-1945 73 y.o. 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 He is doing well and finishing his 3 months of PT. He states that his stamina and energy have improved but he does home to continue with PT for another couple months. He plans to discus this with his PCP later today.  PET scan last week showed no evidence of new or recurrent disease.  He is starting to have diarrhea again and is taking imodium when needed which he states does help. He had 4 episodes yesterday and 1 so far today. He has also noted increased flatus. He was scheduled to have a colonoscopy with Dr. Ardis Hughs this month but that has been put on hold due to Covid-19.  No episodes of bleeding, no bruising or petechiae.  No fever, chills, n/v, cough, dizziness, chest pain, palpitations, abdominal pain or changes in bladder habits.  He has mild SOB with over exertion and takes a break to rest if needed.  He has noted a rash popping out on his chest after a hot shower that disappears after a little while. His last ferritin in March was 90 and total iron was 65.  He has puffiness in both ankles that waxes and wanes.  No tenderness, numbness or tingling in his extremities.  He describes his appetite as moderate and is staying well hydrated. His weight is unchanged.   ECOG Performance Status: 1 - Symptomatic but completely ambulatory  Medications:  Allergies as of 07/22/2018      Reactions   Cefepime Hives, Shortness Of Breath   Cephaeline Hives   Morphine Swelling   A swollen  stomach.   Morphine And Related Shortness Of Breath, Nausea And Vomiting, Swelling, Other (See Comments)   Agitation, tolerates dilaudid Other reaction(s): Other (See Comments) Agitation, tolerates dilaudid   Penicillins Hives, Shortness Of Breath, Rash   Has patient had a PCN reaction causing immediate rash, facial/tongue/throat swelling, SOB or lightheadedness with hypotension: Yes Has patient had a PCN reaction causing severe rash involving mucus membranes or skin necrosis: Yes Has patient had a PCN reaction that required hospitalization No Has patient had a PCN reaction occurring within the last 10 years: No If all of the above answers are "NO", then may proceed with Cephalosporin use.   Doxycycline Rash   Oxycodone-acetaminophen Other (See Comments)   Patient doesn't remember what type of reaction.      Medication List       Accurate as of July 22, 2018 11:05 AM. Always use your most recent med list.        albuterol 108 (90 Base) MCG/ACT inhaler Commonly known as:  PROVENTIL HFA;VENTOLIN HFA Inhale 1-2 puffs into the lungs every 4 (four) hours as needed for wheezing or shortness of breath.   alfuzosin 10 MG 24 hr tablet Commonly known as:  UROXATRAL Take 10 mg by mouth at bedtime.   ALPRAZolam 0.5 MG tablet Commonly known as:  XANAX TAKE 1 TABLET BY MOUTH TWICE DAILY AS NEEDED FOR ANXIETY   Cialis 5 MG tablet Generic drug:  tadalafil Take 5 mg by mouth  as needed.   clopidogrel 75 MG tablet Commonly known as:  PLAVIX TAKE 1 TABLET BY MOUTH EVERY DAY WITH BREAKFAST   dronabinol 5 MG capsule Commonly known as:  MARINOL Take 1 capsule (5 mg total) by mouth 2 (two) times daily before a meal.   Fish Oil Omega-3 1000 MG Caps Take 1,000 mg by mouth daily.   FLUoxetine 40 MG capsule Commonly known as:  PROZAC Take 1 capsule (40 mg total) by mouth 2 (two) times daily.   fluticasone 50 MCG/ACT nasal spray Commonly known as:  FLONASE INSTILL 2 SPRAYS IN EACH NOSTRIL  EVERY DAY   gabapentin 300 MG capsule Commonly known as:  NEURONTIN Take 1 capsule (300 mg total) by mouth 2 (two) times daily.   HYDROmorphone 2 MG tablet Commonly known as:  DILAUDID Take 1 tablet (2 mg total) by mouth every 8 (eight) hours as needed for severe pain.   HYDROmorphone 2 MG tablet Commonly known as:  DILAUDID Take 1 tablet (2 mg total) by mouth every 8 (eight) hours as needed for severe pain.   lidocaine-prilocaine cream Commonly known as:  EMLA Apply 1 application topically as needed.   losartan-hydrochlorothiazide 50-12.5 MG tablet Commonly known as:  HYZAAR Take 1 tablet by mouth daily.   Melatonin 5 MG Tabs Take 5 mg by mouth at bedtime. Takes 10 mg at HS   metoprolol tartrate 25 MG tablet Commonly known as:  LOPRESSOR Take 0.5 tablets (12.5 mg total) by mouth 2 (two) times daily.   mupirocin ointment 2 % Commonly known as:  BACTROBAN APPLY AA OF THE SKIN QD   ondansetron 8 MG tablet Commonly known as:  ZOFRAN Take 1 tablet (8 mg total) by mouth 3 (three) times daily.   pantoprazole 40 MG tablet Commonly known as:  PROTONIX Take 1 tablet (40 mg total) by mouth every evening.   potassium chloride SA 20 MEQ tablet Commonly known as:  K-DUR,KLOR-CON DISSOLVE 4 TABLETS IN 4 TO 8 OUNCES OF WATER AND DRINK BY MOUTH ONCE ON DAY 1 THEN DISSOLVE 2 TABLETS AND DRINK ONCE DAILY FOR 10 DAYS   pravastatin 20 MG tablet Commonly known as:  PRAVACHOL Take 1 tablet (20 mg total) by mouth daily.   predniSONE 5 MG tablet Commonly known as:  DELTASONE Take 2 tablets (10 mg total) by mouth daily with breakfast.   prochlorperazine 10 MG tablet Commonly known as:  COMPAZINE TAKE 1 TABLET(10 MG) BY MOUTH EVERY 6 HOURS AS NEEDED FOR NAUSEA OR VOMITING   torsemide 10 MG tablet Commonly known as:  Demadex Take 1-2 tablets (10-20 mg total) by mouth daily.   traZODone 100 MG tablet Commonly known as:  DESYREL 1 po at hs   vitamin B-12 1000 MCG tablet Commonly  known as:  CYANOCOBALAMIN Take 1,000 mcg by mouth daily.   Vitamin D3 50 MCG (2000 UT) capsule Take 1 capsule (2,000 Units total) by mouth daily.       Allergies:  Allergies  Allergen Reactions  . Cefepime Hives and Shortness Of Breath  . Cephaeline Hives  . Morphine Swelling    A swollen stomach.  . Morphine And Related Shortness Of Breath, Nausea And Vomiting, Swelling and Other (See Comments)    Agitation, tolerates dilaudid Other reaction(s): Other (See Comments) Agitation, tolerates dilaudid  . Penicillins Hives, Shortness Of Breath and Rash    Has patient had a PCN reaction causing immediate rash, facial/tongue/throat swelling, SOB or lightheadedness with hypotension: Yes Has patient had a PCN  reaction causing severe rash involving mucus membranes or skin necrosis: Yes Has patient had a PCN reaction that required hospitalization No Has patient had a PCN reaction occurring within the last 10 years: No If all of the above answers are "NO", then may proceed with Cephalosporin use.   [name] Doxycycline Rash  . Oxycodone-Acetaminophen Other (See Comments)    Patient doesn't remember what type of reaction.    Past Medical History, Surgical history, Social history, and Family History were reviewed and updated.  Review of Systems: All other 10 point review of systems is negative.   Physical Exam:  vitals were not taken for this visit.   Wt Readings from Last 3 Encounters:  06/24/18 137 lb (62.1 kg)  06/11/18 137 lb 12 oz (62.5 kg)  05/05/18 138 lb (62.6 kg)    Ocular: Sclerae unicteric, pupils equal, round and reactive to light Ear-nose-throat: Oropharynx clear, dentition fair Lymphatic: No cervical or supraclavicular adenopathy Lungs no rales or rhonchi, good excursion bilaterally Heart regular rate and rhythm, no murmur appreciated Abd soft, nontender, positive bowel sounds, no liver or spleen tip palpated on exam, no fluid wave  MSK no focal spinal tenderness, no joint  edema Neuro: non-focal, well-oriented, appropriate affect Breasts: Deferred   Lab Results  Component Value Date   WBC 7.5 07/22/2018   HGB 12.3 (L) 07/22/2018   HCT 37.5 (L) 07/22/2018   MCV 94.9 07/22/2018   PLT 257 07/22/2018   Lab Results  Component Value Date   FERRITIN 90 06/11/2018   IRON 65 06/11/2018   TIBC 209 (L) 06/11/2018   UIBC 144 06/11/2018   IRONPCTSAT 31 06/11/2018   Lab Results  Component Value Date   RETICCTPCT 2.1 06/11/2018   RBC 3.95 (L) 07/22/2018   No results found for: KPAFRELGTCHN, LAMBDASER, KAPLAMBRATIO No results found for: IGGSERUM, IGA, IGMSERUM No results found for: Odetta Pink, SPEI   Chemistry      Component Value Date/Time   NA 135 07/22/2018 1007   NA 140 04/10/2017 1046   NA 135 (L) 03/06/2017 1104   K 3.3 (L) 07/22/2018 1007   K 3.7 04/10/2017 1046   K 4.2 03/06/2017 1104   CL 99 07/22/2018 1007   CL 98 04/10/2017 1046   CO2 31 07/22/2018 1007   CO2 31 04/10/2017 1046   CO2 30 (H) 03/06/2017 1104   BUN 14 07/22/2018 1007   BUN 21 04/10/2017 1046   BUN 20.0 03/06/2017 1104   CREATININE 1.12 07/22/2018 1007   CREATININE 0.9 04/10/2017 1046   CREATININE 1.0 03/06/2017 1104      Component Value Date/Time   CALCIUM 8.9 07/22/2018 1007   CALCIUM 8.9 04/10/2017 1046   CALCIUM 9.4 03/06/2017 1104   ALKPHOS 53 07/22/2018 1007   ALKPHOS 65 04/10/2017 1046   ALKPHOS 78 03/06/2017 1104   AST 10 (L) 07/22/2018 1007   AST 20 03/06/2017 1104   ALT 13 07/22/2018 1007   ALT 21 04/10/2017 1046   ALT 20 03/06/2017 1104   BILITOT 0.5 07/22/2018 1007   BILITOT 0.47 03/06/2017 1104       Impression and Plan: Ms. Frutiger is a very pleasant 73 yo caucasian gentleman with history of hemochromatosis, homozygous for the C282Y mutation which has not been a problem for him.  He also has history of stage IIIC nodular melanoma of the left thigh. He completed treatment with Opdivo in  October 2019.  He will continue to take imodium as  needed for bouts of diarrhea and will reschedule his colonoscopy as soon as possible.  We will see what his iron studies show and schedule him for phlebotomy if needed.  We will plan to see him back in another month.  He will contact our office with any questions or concerns. We can certainly see him sooner if need be.   Laverna Peace, NP 4/15/202011:05 AM

## 2018-07-22 NOTE — Telephone Encounter (Signed)
Appointments scheduled patient notified date/time per 4/15 los

## 2018-07-23 ENCOUNTER — Other Ambulatory Visit: Payer: Self-pay

## 2018-07-23 LAB — IRON AND TIBC
Iron: 75 ug/dL (ref 42–163)
Saturation Ratios: 43 % (ref 20–55)
TIBC: 174 ug/dL — ABNORMAL LOW (ref 202–409)
UIBC: 99 ug/dL — ABNORMAL LOW (ref 117–376)

## 2018-07-23 LAB — FERRITIN: Ferritin: 100 ng/mL (ref 24–336)

## 2018-07-23 MED ORDER — GABAPENTIN 300 MG PO CAPS: 300.0000 mg | ORAL_CAPSULE | Freq: Two times a day (BID) | ORAL | 1 refills | Status: DC

## 2018-07-23 MED ORDER — TRAZODONE HCL 100 MG PO TABS: ORAL_TABLET | ORAL | 3 refills | Status: DC

## 2018-07-23 MED ORDER — PREDNISONE 5 MG PO TABS: 10.0000 mg | ORAL_TABLET | Freq: Every day | ORAL | 2 refills | Status: DC

## 2018-07-24 DIAGNOSIS — R2681 Unsteadiness on feet: Secondary | ICD-10-CM | POA: Diagnosis not present

## 2018-07-24 DIAGNOSIS — R262 Difficulty in walking, not elsewhere classified: Secondary | ICD-10-CM | POA: Diagnosis not present

## 2018-07-24 DIAGNOSIS — M6281 Muscle weakness (generalized): Secondary | ICD-10-CM | POA: Diagnosis not present

## 2018-07-24 DIAGNOSIS — M25561 Pain in right knee: Secondary | ICD-10-CM | POA: Diagnosis not present

## 2018-07-24 DIAGNOSIS — R278 Other lack of coordination: Secondary | ICD-10-CM | POA: Diagnosis not present

## 2018-07-27 ENCOUNTER — Encounter: Payer: Medicare Other | Admitting: Gastroenterology

## 2018-07-28 DIAGNOSIS — M25561 Pain in right knee: Secondary | ICD-10-CM | POA: Diagnosis not present

## 2018-07-28 DIAGNOSIS — R278 Other lack of coordination: Secondary | ICD-10-CM | POA: Diagnosis not present

## 2018-07-28 DIAGNOSIS — M6281 Muscle weakness (generalized): Secondary | ICD-10-CM | POA: Diagnosis not present

## 2018-07-28 DIAGNOSIS — R262 Difficulty in walking, not elsewhere classified: Secondary | ICD-10-CM | POA: Diagnosis not present

## 2018-07-28 DIAGNOSIS — R2681 Unsteadiness on feet: Secondary | ICD-10-CM | POA: Diagnosis not present

## 2018-07-31 DIAGNOSIS — R2681 Unsteadiness on feet: Secondary | ICD-10-CM | POA: Diagnosis not present

## 2018-07-31 DIAGNOSIS — R278 Other lack of coordination: Secondary | ICD-10-CM | POA: Diagnosis not present

## 2018-07-31 DIAGNOSIS — R262 Difficulty in walking, not elsewhere classified: Secondary | ICD-10-CM | POA: Diagnosis not present

## 2018-07-31 DIAGNOSIS — M25561 Pain in right knee: Secondary | ICD-10-CM | POA: Diagnosis not present

## 2018-07-31 DIAGNOSIS — M6281 Muscle weakness (generalized): Secondary | ICD-10-CM | POA: Diagnosis not present

## 2018-08-04 DIAGNOSIS — D3132 Benign neoplasm of left choroid: Secondary | ICD-10-CM | POA: Diagnosis not present

## 2018-08-04 DIAGNOSIS — H35042 Retinal micro-aneurysms, unspecified, left eye: Secondary | ICD-10-CM | POA: Diagnosis not present

## 2018-08-04 DIAGNOSIS — H3581 Retinal edema: Secondary | ICD-10-CM | POA: Diagnosis not present

## 2018-08-04 DIAGNOSIS — H43813 Vitreous degeneration, bilateral: Secondary | ICD-10-CM | POA: Diagnosis not present

## 2018-08-04 DIAGNOSIS — H35033 Hypertensive retinopathy, bilateral: Secondary | ICD-10-CM | POA: Diagnosis not present

## 2018-08-06 DIAGNOSIS — D2271 Melanocytic nevi of right lower limb, including hip: Secondary | ICD-10-CM | POA: Diagnosis not present

## 2018-08-06 DIAGNOSIS — D692 Other nonthrombocytopenic purpura: Secondary | ICD-10-CM | POA: Diagnosis not present

## 2018-08-06 DIAGNOSIS — L821 Other seborrheic keratosis: Secondary | ICD-10-CM | POA: Diagnosis not present

## 2018-08-06 DIAGNOSIS — D1801 Hemangioma of skin and subcutaneous tissue: Secondary | ICD-10-CM | POA: Diagnosis not present

## 2018-08-06 DIAGNOSIS — Z85820 Personal history of malignant melanoma of skin: Secondary | ICD-10-CM | POA: Diagnosis not present

## 2018-08-06 DIAGNOSIS — L720 Epidermal cyst: Secondary | ICD-10-CM | POA: Diagnosis not present

## 2018-08-12 ENCOUNTER — Telehealth: Payer: Self-pay | Admitting: Family

## 2018-08-12 ENCOUNTER — Other Ambulatory Visit: Payer: Self-pay | Admitting: Internal Medicine

## 2018-08-12 NOTE — Telephone Encounter (Signed)
LMVM for patient regarding appointments being rescheduled from 5/15 to 5/13 due to provider out of office on PAL

## 2018-08-19 ENCOUNTER — Encounter: Payer: Self-pay | Admitting: Family

## 2018-08-19 ENCOUNTER — Inpatient Hospital Stay: Payer: Medicare Other | Attending: Hematology & Oncology

## 2018-08-19 ENCOUNTER — Inpatient Hospital Stay: Payer: Medicare Other

## 2018-08-19 ENCOUNTER — Telehealth: Payer: Self-pay | Admitting: *Deleted

## 2018-08-19 ENCOUNTER — Inpatient Hospital Stay (HOSPITAL_BASED_OUTPATIENT_CLINIC_OR_DEPARTMENT_OTHER): Payer: Medicare Other | Admitting: Family

## 2018-08-19 ENCOUNTER — Other Ambulatory Visit: Payer: Self-pay

## 2018-08-19 VITALS — BP 120/70 | HR 64 | Temp 98.1°F | Resp 18 | Ht 66.0 in | Wt 138.0 lb

## 2018-08-19 DIAGNOSIS — Z95828 Presence of other vascular implants and grafts: Secondary | ICD-10-CM

## 2018-08-19 DIAGNOSIS — C4372 Malignant melanoma of left lower limb, including hip: Secondary | ICD-10-CM | POA: Diagnosis not present

## 2018-08-19 LAB — CBC WITH DIFFERENTIAL (CANCER CENTER ONLY)
Abs Immature Granulocytes: 0.02 K/uL (ref 0.00–0.07)
Basophils Absolute: 0 K/uL (ref 0.0–0.1)
Basophils Relative: 0 %
Eosinophils Absolute: 0 K/uL (ref 0.0–0.5)
Eosinophils Relative: 0 %
HCT: 37.7 % — ABNORMAL LOW (ref 39.0–52.0)
Hemoglobin: 12.4 g/dL — ABNORMAL LOW (ref 13.0–17.0)
Immature Granulocytes: 0 %
Lymphocytes Relative: 12 %
Lymphs Abs: 0.6 K/uL — ABNORMAL LOW (ref 0.7–4.0)
MCH: 30.9 pg (ref 26.0–34.0)
MCHC: 32.9 g/dL (ref 30.0–36.0)
MCV: 94 fL (ref 80.0–100.0)
Monocytes Absolute: 0.3 K/uL (ref 0.1–1.0)
Monocytes Relative: 6 %
Neutro Abs: 4.3 K/uL (ref 1.7–7.7)
Neutrophils Relative %: 82 %
Platelet Count: 201 K/uL (ref 150–400)
RBC: 4.01 MIL/uL — ABNORMAL LOW (ref 4.22–5.81)
RDW: 12.6 % (ref 11.5–15.5)
WBC Count: 5.3 K/uL (ref 4.0–10.5)
nRBC: 0 % (ref 0.0–0.2)

## 2018-08-19 LAB — CMP (CANCER CENTER ONLY)
ALT: 12 U/L (ref 0–44)
AST: 11 U/L — ABNORMAL LOW (ref 15–41)
Albumin: 3.7 g/dL (ref 3.5–5.0)
Alkaline Phosphatase: 56 U/L (ref 38–126)
Anion gap: 6 (ref 5–15)
BUN: 18 mg/dL (ref 8–23)
CO2: 30 mmol/L (ref 22–32)
Calcium: 9 mg/dL (ref 8.9–10.3)
Chloride: 98 mmol/L (ref 98–111)
Creatinine: 0.91 mg/dL (ref 0.61–1.24)
GFR, Est AFR Am: >60 mL/min
GFR, Estimated: >60 mL/min
Glucose, Bld: 127 mg/dL — ABNORMAL HIGH (ref 70–99)
Potassium: 4.1 mmol/L (ref 3.5–5.1)
Sodium: 134 mmol/L — ABNORMAL LOW (ref 135–145)
Total Bilirubin: 0.6 mg/dL (ref 0.3–1.2)
Total Protein: 6.1 g/dL — ABNORMAL LOW (ref 6.5–8.1)

## 2018-08-19 MED ORDER — HEPARIN SOD (PORK) LOCK FLUSH 100 UNIT/ML IV SOLN
500.0000 [IU] | Freq: Once | INTRAVENOUS | Status: AC
  Administered 2018-08-19: 500 [IU] via INTRAVENOUS
  Filled 2018-08-19: qty 5

## 2018-08-19 MED ORDER — SODIUM CHLORIDE 0.9% FLUSH
10.0000 mL | Freq: Once | INTRAVENOUS | Status: AC
  Administered 2018-08-19: 10 mL via INTRAVENOUS
  Filled 2018-08-19: qty 10

## 2018-08-19 NOTE — Telephone Encounter (Signed)
Spoke with pt and PV scheduled for 08-28-18 at 1:30.  Colonoscopy scheduled for 09-11-18 at 1000

## 2018-08-19 NOTE — Progress Notes (Addendum)
Subjective:   [name] is a 73 y.o. male who presents for Medicare Annual/Subsequent preventive examination.  Review of Systems:  No ROS.  Medicare Wellness Virtual Visit.  Visual/audio telehealth visit, UTA vital signs.   See social history for additional risk factors. I connected with patient by a telephone and verified that I am speaking with the correct person using two identifiers. Patient stated full name and DOB. Patient gave permission to continue with telephonic visit. Patient's location was at home and Nurse's location was at [place] office.   Cardiac Risk Factors include: advanced age (>36mn, >>1women);male gender Sleep patterns: feels rested on waking, gets up 1-2 times nightly to void and sleeps 9-10 hours nightly.    Home Safety/Smoke Alarms: Feels safe in home. Smoke alarms in place.  Living environment; residence and Firearm Safety: [place]. [place] with wife, no needs for DME, good support system Seat Belt Safety/Bike Helmet: Wears seat belt.  PSA-  Lab Results  Component Value Date   PSA 1.95 10/15/2010   PSA 1.77 10/06/2009   PSA 1.64 10/11/2008      Objective:    Vitals: There were no vitals taken for this visit.  There is no height or weight on file to calculate BMI.  Advanced Directives 08/20/2018 08/19/2018 07/22/2018 06/11/2018 04/16/2018 03/25/2018 03/20/2018  Does Patient Have a Medical Advance Directive? No Yes Yes Yes Yes Yes Yes  Type of Advance Directive - [place] will Living will;Healthcare Power of [place] will  Does patient want to make changes to medical advance directive? Yes (ED - Information included in AVS) - No - Patient declined - - - No - Patient declined  Copy of [place] Chart? - Yes - validated  most recent copy scanned in chart (See row information) Yes - validated most recent copy scanned in chart (See row information) - - - Yes - validated most recent copy scanned in chart (See row information)  Pre-existing out of facility DNR order (yellow form or pink MOST form) - - - - - - -    Tobacco Social History   Tobacco Use  Smoking Status Never Smoker  Smokeless Tobacco Never Used     Counseling given: Not Answered  Past Medical History:  Diagnosis Date  . Alcoholism /alcohol abuse ([place]    per family  . Allergic rhinitis   . Anxiety   . Bacterial infection   . CAD (coronary artery disease)    minimal coronary plaque in the LAD and right coronary system. 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 7-06. Last nuclear stress 11-17-06 fixed anterior/ inferior defect, no inducible ischemia, EF 81%  . Chronic back pain    "all over back"  . Chronic neck pain   . Depression   . Diverticulosis   . Dyspnea    with exertion  . Dysrhythmia 01-24-12   past hx. A.Fib x1 episode-responded to med.  . Falls frequently    "since 02/2013" (06/16/2013)  . Family history of cancer   . Genetic testing 03/06/2017   Multi-Cancer panel (83 genes) @ [facility] - No pathogenic mutations detected  . GERD (gastroesophageal reflux disease)   . Hemochromatosis    dx'd 14 yrs ago last ferritin Aug 11, 08 52 (22-322), Fe 136 ("I had 250 phlebotomies  for that")  . High cholesterol    hx  . Hx of colonic polyps   . Hx of colonoscopy   . Hypertension   . Iron deficiency anemia due to chronic blood loss 12/27/2016  . Malignant melanoma of knee, left ([place])   . Myocardial infarction [facility]) 2006   "related to catheterization"  . Narcotic abuse ([place])    per family  . Osteoarthritis   . PONV (postoperative nausea and vomiting)   . RA (rheumatoid arthritis) ([place])    Past Surgical History:  Procedure Laterality Date  . ABDOMINAL ADHESION  SURGERY  ~ 1968  . ANKLE RECONSTRUCTION Right 6-09   [facility]  . APPENDECTOMY  ~ 1956  . ASPIRATION OF ABSCESS Left 03/27/2017   Procedure: ASPIRATION OF SEROMA;  Surgeon: Stark Klein, MD;  Location: [place];  Service: General;  Laterality: Left;  . BONE TUMOR RESECTION  ~ 1954   "taken off my mastoid"  . CARPAL TUNNEL RELEASE Right 1990's  . CATARACT EXTRACTION, BILATERAL Bilateral 01-24-12  . CORONARY ANGIOPLASTY WITH STENT PLACEMENT  2006   "while repairing 1st stent, a second area tore and they had to place 2nd stent " ?LAD & CX  . CORONARY ANGIOPLASTY WITH STENT PLACEMENT  06/17/2014  . CYST EXCISION  "several OR's"   "backX 2, back of my neck, face, inside right bicept, chest, wrist"  . FOOT SURGERY Right 11-08   for removal of bone spurs-  . HAMMER TOE SURGERY Right 07/2012   "broke 4 hammertoes"   . HARDWARE REMOVAL  03/09/2012   Procedure: HARDWARE REMOVAL;  Surgeon: Nita Sells, MD;  Location: [place];  Service: Orthopedics;  Laterality: Left;  Hardware Removal from Left Shoulder  . HARVEST BONE GRAFT  02/06/2012   Procedure: HARVEST ILIAC BONE GRAFT;  Surgeon: Nita Sells, MD;  Location: [facility];  Service: Orthopedics;;  bone marrow aspirqation   . INGUINAL HERNIA REPAIR Bilateral   . JOINT REPLACEMENT    . KNEE SURGERY Left ~ 2003   "6-12 months after uni knee removed synovial sack"  . LEFT HEART CATHETERIZATION WITH CORONARY ANGIOGRAM N/A 06/17/2014   PCI of diffuse severe stenosis in the proximal to mid LAD using overlapping drug-eluting stents.  . LUMBAR WOUND DEBRIDEMENT N/A 03/17/2013   Procedure: Incision and drainage of superficial lumbar wound;  Surgeon: Floyce Stakes, MD;  Location: [place] [facility];  Service: Neurosurgery;  Laterality: N/A;  Incision and drainage of superficial lumbar wound  . MECKEL DIVERTICULUM EXCISION  ~ 1956  . MELANOMA EXCISION WITH SENTINEL LYMPH NODE BIOPSY Left 02/12/2017   WIDE LOCAL  EXCISION LEFT KNEE MELANOMA, ADVANCEMENT FLAP CLOSURE, AND SENTINEL LYMPH NODE MAPPING AND BIOPSY.  [name] MELANOMA EXCISION WITH SENTINEL LYMPH NODE BIOPSY Left 02/12/2017   Procedure: WIDE LOCAL EXCISION LEFT KNEE MELANOMA, ADVANCEMENT FLAP CLOSURE, AND SENTINEL LYMPH NODE MAPPING AND BIOPSY.;  Surgeon: Stark Klein, MD;  Location: [place];  Service: General;  Laterality: Left;  GENERAL AND LOCAL  . ORIF SHOULDER FRACTURE  02/06/2012   Procedure: OPEN REDUCTION INTERNAL FIXATION (ORIF) SHOULDER FRACTURE;  Surgeon: Nita Sells, MD;  [facility];  Service: Orthopedics;  Laterality: Left;  ORIF of a Left Shoulder Fracture with  Iliac Crest Bone Graft aspiration   . PORTACATH PLACEMENT Left 03/27/2017   Procedure: INSERTION PORT-A-CATH;  Surgeon: Stark Klein, MD;  Location: [place];  Service: General;  Laterality: Left;  . POSTERIOR LUMBAR FUSION  12-10  L4-5 diskectomy w/ fusion, cage placement and rods; [place]  . POSTERIOR LUMBAR FUSION 4 LEVEL N/A 03/02/2013   Procedure: Lumbar One to Sacral One Posterior lumbar interbody fusion;  Surgeon: Floyce Stakes, MD;  Location: [place] [facility];  Service: Neurosurgery;  Laterality: N/A;  L1 to S1 Posterior lumbar interbody fusion  . REPLACEMENT UNICONDYLAR JOINT KNEE Left ~ 2003   "~ 6 months after total knee replaced"  . SHOULDER ARTHROSCOPY Left ~ 2004 X 2   "@ [facility]; left bone splinter in & had to clean it out"  . TOTAL ANKLE REPLACEMENT Right 2008   at [facility]  . TOTAL KNEE ARTHROPLASTY Bilateral 2002  . TOTAL SHOULDER REPLACEMENT Left 2006  . TOTAL SHOULDER REPLACEMENT Right ~ 2007   Dr. Marlou Sa   Family History  Problem Relation Age of Onset  . Uterine cancer Mother        uterine vs. cervix? dx 43s; deceased 38  . Macular degeneration Mother   . Other Mother        ankle edema  . Lung cancer Mother   . Melanoma Mother        multiple spots on legs  . Cancer - Lung Mother        smoker  . Coronary artery  disease Father   . Hypertension Father   . Prostate cancer Father   . Colon polyps Father   . Cancer - Prostate Father 64       deceased 71  . Heart attack Brother   . Hyperlipidemia Brother   . Other Brother        Schizophrenic  . Thyroid disease Sister   . Hemochromatosis Sister   . Rheum arthritis Sister   . Coronary artery disease Maternal Aunt   . Heart attack Maternal Aunt   . Cancer Maternal Grandmother        unk. type; deceased 30s  . Diabetes Neg Hx   . Colon cancer Neg Hx   . Esophageal cancer Neg Hx   . Rectal cancer Neg Hx   . Stomach cancer Neg Hx    Social History   Socioeconomic History  . Marital status: Significant Other    Spouse name: Not on file  . Number of children: 2  . Years of education: 14  . Highest education level: Not on file  Occupational History  . 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    Comment: self employed  Social Needs  . Financial resource strain: Not hard at all  . Food insecurity:    Worry: Never true    Inability: Never true  . Transportation needs:    Medical: No    Non-medical: No  Tobacco Use  . Smoking status: Never Smoker  . Smokeless tobacco: Never Used  Substance and Sexual Activity  . Alcohol use: No    Alcohol/week: 0.0 standard drinks    Comment: "I do not drink anymore"  . Drug use: No  . Sexual activity: Yes    Partners: Female  Lifestyle  . Physical activity:    Days per week: 3 days    Minutes per session: 30 min  . Stress: Only a little  Relationships  . Social connections:    Talks on phone: More than three times a week    Gets together: More than three times a week    Attends religious service: More than 4 times per year    Active member of club or organization: Yes  Attends meetings of clubs or organizations: More than 4 times per year    Relationship status: Living with partner  Other Topics Concern  . Not on file  Social History Narrative   [employer] [place]. Married  1972-76yr seperated/divorced. Engaged April '11. 1 son '74; step-daughter '70. 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 During '11 discovered an [employer]- $500,000+ loss. Is handling this OK - will keep the business running.    Outpatient Encounter Medications as of 08/20/2018  Medication Sig  . albuterol (PROVENTIL HFA;VENTOLIN HFA) 108 (90 Base) MCG/ACT inhaler Inhale 1-2 puffs into the lungs every 4 (four) hours as needed for wheezing or shortness of breath.  . alfuzosin (UROXATRAL) 10 MG 24 hr tablet Take 10 mg by mouth at bedtime.   . ALPRAZolam (XANAX) 0.5 MG tablet TAKE 1 TABLET BY MOUTH TWICE DAILY AS NEEDED FOR ANXIETY  . Cholecalciferol (VITAMIN D3) 2000 UNITS capsule Take 1 capsule (2,000 Units total) by mouth daily.  . clopidogrel (PLAVIX) 75 MG tablet TAKE 1 TABLET BY MOUTH EVERY DAY WITH BREAKFAST  . dronabinol (MARINOL) 5 MG capsule Take 1 capsule (5 mg total) by mouth 2 (two) times daily before a meal.  . FLUoxetine (PROZAC) 40 MG capsule TAKE 1 CAPSULE BY MOUTH TWICE DAILY  . fluticasone (FLONASE) 50 MCG/ACT nasal spray INSTILL 2 SPRAYS IN EACH NOSTRIL EVERY DAY  . gabapentin (NEURONTIN) 300 MG capsule Take 1 capsule (300 mg total) by mouth 2 (two) times daily.  .Marland KitchenHYDROmorphone (DILAUDID) 2 MG tablet Take 1 tablet (2 mg total) by mouth every 8 (eight) hours as needed for severe pain.  .Marland Kitchenlidocaine-prilocaine (EMLA) cream Apply 1 application topically as needed.  .Marland Kitchenlosartan-hydrochlorothiazide (HYZAAR) 50-12.5 MG tablet Take 1 tablet by mouth daily.  . Melatonin 5 MG TABS Take 5 mg by mouth at bedtime. Takes 10 mg at HS  . metoprolol tartrate (LOPRESSOR) 25 MG tablet Take 0.5 tablets (12.5 mg total) by mouth 2 (two) times daily.  . Omega-3 Fatty Acids (FISH OIL OMEGA-3) 1000 MG CAPS Take 1,000 mg by mouth daily.   . ondansetron (ZOFRAN) 8 MG tablet Take 1  tablet (8 mg total) by mouth 3 (three) times daily.  . pantoprazole (PROTONIX) 40 MG tablet Take 1 tablet (40 mg total) by mouth every evening.  . pravastatin (PRAVACHOL) 20 MG tablet Take 1 tablet (20 mg total) by mouth daily. (Patient taking differently: Take 20 mg by mouth every evening. )  . predniSONE (DELTASONE) 5 MG tablet Take 2 tablets (10 mg total) by mouth daily with breakfast.  . prochlorperazine (COMPAZINE) 10 MG tablet TAKE 1 TABLET(10 MG) BY MOUTH EVERY 6 HOURS AS NEEDED FOR NAUSEA OR VOMITING  . Pyridoxine HCl (B-6 PO) Take 1 tablet by mouth.  . tadalafil (CIALIS) 5 MG tablet Take 5 mg by mouth as needed.  . torsemide (DEMADEX) 10 MG tablet Take 1-2 tablets (10-20 mg total) by mouth daily. (Patient taking differently: Take 10-20 mg by mouth as needed. )  . traZODone (DESYREL) 100 MG tablet 1 po at hs  . vitamin B-12 (CYANOCOBALAMIN) 1000 MCG tablet Take 1,000 mcg by mouth daily.   . [DISCONTINUED] HYDROmorphone (DILAUDID) 2 MG tablet Take 1 tablet (2 mg total) by mouth every 8 (eight) hours as needed for severe pain. (Patient not taking: Reported on 08/20/2018)  . [DISCONTINUED] mupirocin ointment (BACTROBAN) 2 % APPLY AA OF  THE SKIN QD  . [DISCONTINUED] potassium chloride SA (K-DUR,KLOR-CON) 20 MEQ tablet DISSOLVE 4 TABLETS IN 4 TO 8 OUNCES OF WATER AND DRINK BY MOUTH ONCE ON DAY 1 THEN DISSOLVE 2 TABLETS AND DRINK ONCE DAILY FOR 10 DAYS (Patient not taking: Reported on 08/20/2018)   No facility-administered encounter medications on file as of 08/20/2018.     Activities of Daily Living In your present state of health, do you have any difficulty performing the following activities: 08/20/2018  Hearing? N  Vision? N  Difficulty concentrating or making decisions? N  Walking or climbing stairs? N  Dressing or bathing? N  Doing errands, shopping? N  Preparing Food and eating ? N  Using the Toilet? N  In the past six months, have you accidently leaked urine? N  Do you have problems  with loss of bowel control? N  Managing your Medications? N  Managing your Finances? N  Housekeeping or managing your Housekeeping? N  Some recent data might be hidden    Patient Care Team: Plotnikov, Evie Lacks, MD as PCP - General (Internal Medicine) Josue Hector, MD as PCP - Cardiology (Cardiology) Jackolyn Confer, MD as Consulting Physician (General Surgery) Star Age, MD as Attending Physician (Neurology) Comer, Okey Regal, MD as Consulting Physician (Infectious Diseases) Leeroy Cha, MD as Consulting Physician (Neurosurgery) Milus Banister, MD as Attending Physician (Gastroenterology) [name], MD as Consulting Physician (Oncology)   Assessment:   This is a routine wellness examination for [name]. Physical assessment deferred to PCP.  Exercise Activities and Dietary recommendations Current Exercise Habits: Home exercise routine, Type of exercise: walking;strength training/weights(PT and balance exercises), Time (Minutes): 30, Frequency (Times/Week): 4, Weekly Exercise (Minutes/Week): 120, Intensity: Mild, Exercise limited by: orthopedic condition(s);respiratory conditions(s)  Diet (meal preparation, eat out, water intake, caffeinated beverages, dairy products, fruits and vegetables): in general, a "healthy" diet  , well balanced   Appetite and diet has greatly improved since completing cancer treatments. Reports staying hydrated and maintaining weight. Healthy meals provided by [facility].   Goals    . Patient Stated    . Patient Stated     Continue to work towards getting stronger and healthier by staying physically and socially active and by eating healthy.       Fall Risk Fall Risk  08/20/2018 08/18/2017 08/07/2016 10/26/2015 12/10/2013  Falls in the past year? 1 No No No Yes  Number falls in past yr: 1 - - - 2 or more  Injury with Fall? 1 - - - -  Risk Factor Category  - - - - High Fall Risk  Risk for fall due to : History of  fall(s);Impaired mobility;Impaired balance/gait - - - History of fall(s);Impaired balance/gait  Risk for fall due to: Comment Just finish 5 weeks of OT and PT - - - "much better since out of hospital"    Depression Screen PHQ 2/9 Scores 08/20/2018 08/18/2017 08/07/2016 08/07/2016  PHQ - 2 Score 1 1 0 0  PHQ- 9 Score 3 6 - -    Cognitive Function MMSE - Mini Mental State Exam 08/18/2017  Orientation to time 5  Orientation to Place 5  Registration 3  Attention/ Calculation 5  Recall 2  Language- name 2 objects 2  Language- repeat 1  Language- follow 3 step command 3  Language- read & follow direction 1  Write a sentence 1  Copy design 1  Total score 29        Immunization History  Administered Date(s) Administered  . Influenza Whole 01/01/2008, 01/10/2009, 01/10/2010, 03/16/2012  . Influenza, High Dose Seasonal PF 12/17/2016, 12/23/2017  . Influenza,inj,Quad PF,6+ Mos 01/05/2013, 12/10/2013, 12/05/2014  . Influenza-Unspecified 12/25/2015  . Pneumococcal Conjugate-13 12/31/2013  . Pneumococcal Polysaccharide-23 01/01/2008, 03/10/2015  . Tdap 07/23/2011  . Zoster 04/06/2012   Screening Tests Health Maintenance  Topic Date Due  . Hepatitis C Screening  May 09, 1945  . COLONOSCOPY  01/30/2017  . INFLUENZA VACCINE  11/07/2018  . TETANUS/TDAP  07/22/2021  . PNA vac Low Risk Adult  Completed      Plan:      Continue doing brain stimulating activities (puzzles, reading, adult coloring books, staying active) to keep memory sharp.   Continue to eat heart healthy diet (full of fruits, vegetables, whole grains, lean protein, water--limit salt, fat, and sugar intake) and increase physical activity as tolerated.  I have personally reviewed and noted the following in the patient's chart:   . Medical and social history . Use of alcohol, tobacco or illicit drugs  . Current medications and supplements . Functional ability and status . Nutritional status . Physical activity .  Advanced directives . List of other physicians . Screenings to include cognitive, depression, and falls . Referrals and appointments  In addition, I have reviewed and discussed with patient certain preventive protocols, quality metrics, and best practice recommendations. A written personalized care plan for preventive services as well as general preventive health recommendations were provided to patient.     Michiel Cowboy, RN  08/20/2018  Medical screening examination/treatment/procedure(s) were performed by non-physician practitioner and as supervising physician I was immediately available for consultation/collaboration. I agree with above. Cathlean Cower, MD

## 2018-08-19 NOTE — Telephone Encounter (Signed)
Yes, that is ok 

## 2018-08-19 NOTE — Telephone Encounter (Signed)
Dr. Ardis Hughs,  I am trying to get this pt's colonoscopy rescheduled.  Is it ok to use the Plavix clearance from 06-16-18- hold for 5 days prior to procedure?  Thanks, [employer]

## 2018-08-19 NOTE — Progress Notes (Signed)
Hematology and Oncology Follow Up Visit  [name] [id number] 01-Jun-1945 73 y.o. 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 He is doing well and states that imodium has significantly helped decrease his diarrhea.  He has had no episodes of bleeding. His skin is thin and he does bruise easily on Plavix.  No fever, chills, n/v, cough, rash, dizziness, chest pain, palpitations, abdominal pain or changes in bladder habits.  He has swelling and numbness in his hands due to arthritis.  He has completed 15 weeks of PT but states that he still has trouble when walking with SOB secondary to COPD.  He states that he will scheduling his routine colonoscopy soon and hopes to also have an endoscopy that same day.  No lymphadenopathy noted on exam.  He is eating well and staying hydrated. His weight is stable.   ECOG Performance Status: 1 - Symptomatic but completely ambulatory  Medications:  Allergies as of 08/19/2018      Reactions   Cefepime Hives, Shortness Of Breath   Cephaeline Hives   Morphine Swelling   A swollen stomach.   Morphine And Related Shortness Of Breath, Nausea And Vomiting, Swelling, Other (See Comments)   Agitation, tolerates dilaudid Other reaction(s): Other (See Comments) Agitation, tolerates dilaudid   Penicillins Hives, Shortness Of Breath, Rash   Has patient had a PCN reaction causing immediate rash, facial/tongue/throat swelling, SOB or lightheadedness with hypotension: Yes Has patient had a PCN reaction causing severe rash involving mucus membranes or skin necrosis: Yes  Has patient had a PCN reaction that required hospitalization No Has patient had a PCN reaction occurring within the last 10 years: No If all of the above answers are "NO", then may proceed with Cephalosporin use.   Doxycycline Rash   Oxycodone-acetaminophen Other (See Comments)   Patient doesn't remember what type of reaction.      Medication List       Accurate as of Aug 19, 2018  4:35 PM. If you have any questions, ask your nurse or doctor.        albuterol 108 (90 Base) MCG/ACT inhaler Commonly known as:  VENTOLIN HFA Inhale 1-2 puffs into the lungs every 4 (four) hours as needed for wheezing or shortness of breath.   alfuzosin 10 MG 24 hr tablet Commonly known as:  UROXATRAL Take 10 mg by mouth at bedtime.   ALPRAZolam 0.5 MG tablet Commonly known as:  XANAX TAKE 1 TABLET BY MOUTH TWICE DAILY AS NEEDED FOR ANXIETY   Cialis 5 MG tablet Generic drug:  tadalafil Take 5 mg by mouth as needed.   clopidogrel 75 MG tablet Commonly known as:  PLAVIX TAKE 1 TABLET BY MOUTH EVERY DAY WITH BREAKFAST   dronabinol 5 MG capsule Commonly known as:  MARINOL Take 1 capsule (5 mg total) by mouth 2 (two) times daily before a meal.   Fish Oil Omega-3 1000 MG Caps Take 1,000 mg by mouth daily.   FLUoxetine 40 MG capsule Commonly known as:  PROZAC TAKE 1 CAPSULE BY MOUTH TWICE DAILY   fluticasone 50 MCG/ACT nasal spray Commonly known as:  FLONASE INSTILL 2 SPRAYS  IN EACH NOSTRIL EVERY DAY   gabapentin 300 MG capsule Commonly known as:  NEURONTIN Take 1 capsule (300 mg total) by mouth 2 (two) times daily.   HYDROmorphone 2 MG tablet Commonly known as:  DILAUDID Take 1 tablet (2 mg total) by mouth every 8 (eight) hours as needed for severe pain.   HYDROmorphone 2 MG tablet Commonly known as:  DILAUDID Take 1 tablet (2 mg total) by mouth every 8 (eight) hours as needed for severe pain.   lidocaine-prilocaine cream Commonly known as:  EMLA Apply 1 application topically as  needed.   losartan-hydrochlorothiazide 50-12.5 MG tablet Commonly known as:  HYZAAR Take 1 tablet by mouth daily.   Melatonin 5 MG Tabs Take 5 mg by mouth at bedtime. Takes 10 mg at HS   metoprolol tartrate 25 MG tablet Commonly known as:  LOPRESSOR Take 0.5 tablets (12.5 mg total) by mouth 2 (two) times daily.   mupirocin ointment 2 % Commonly known as:  BACTROBAN APPLY AA OF THE SKIN QD   ondansetron 8 MG tablet Commonly known as:  ZOFRAN Take 1 tablet (8 mg total) by mouth 3 (three) times daily.   pantoprazole 40 MG tablet Commonly known as:  PROTONIX Take 1 tablet (40 mg total) by mouth every evening.   potassium chloride SA 20 MEQ tablet Commonly known as:  K-DUR DISSOLVE 4 TABLETS IN 4 TO 8 OUNCES OF WATER AND DRINK BY MOUTH ONCE ON DAY 1 THEN DISSOLVE 2 TABLETS AND DRINK ONCE DAILY FOR 10 DAYS   pravastatin 20 MG tablet Commonly known as:  PRAVACHOL Take 1 tablet (20 mg total) by mouth daily. What changed:  when to take this   predniSONE 5 MG tablet Commonly known as:  DELTASONE Take 2 tablets (10 mg total) by mouth daily with breakfast.   prochlorperazine 10 MG tablet Commonly known as:  COMPAZINE TAKE 1 TABLET(10 MG) BY MOUTH EVERY 6 HOURS AS NEEDED FOR NAUSEA OR VOMITING   torsemide 10 MG tablet Commonly known as:  Demadex Take 1-2 tablets (10-20 mg total) by mouth daily.   traZODone 100 MG tablet Commonly known as:  DESYREL 1 po at hs   vitamin B-12 1000 MCG tablet Commonly known as:  CYANOCOBALAMIN Take 1,000 mcg by mouth daily.   Vitamin D3 50 MCG (2000 UT) capsule Take 1 capsule (2,000 Units total) by mouth daily.       Allergies:  Allergies  Allergen Reactions  . Cefepime Hives and Shortness Of Breath  . Cephaeline Hives  . Morphine Swelling    A swollen stomach.  . Morphine And Related Shortness Of Breath, Nausea And Vomiting, Swelling and Other (See Comments)    Agitation, tolerates dilaudid Other reaction(s): Other (See Comments)  Agitation, tolerates dilaudid  . Penicillins Hives, Shortness Of Breath and Rash    Has patient had a PCN reaction causing immediate rash, facial/tongue/throat swelling, SOB or lightheadedness with hypotension: Yes Has patient had a PCN reaction causing severe rash involving mucus membranes or skin necrosis: Yes Has patient had a PCN reaction that required hospitalization No Has patient had a PCN reaction occurring within the last 10 years: No If all of the above answers are "NO", then may proceed with Cephalosporin use.   [name] Doxycycline Rash  . Oxycodone-Acetaminophen Other (See Comments)    Patient doesn't remember what type of reaction.    Past Medical History, Surgical history, Social history, and Family History were reviewed and updated.  Review of Systems: All other 10  point review of systems is negative.   Physical Exam:  height is 5' 6"  (1.676 m) and weight is 138 lb (62.6 kg). His oral temperature is 98.1 F (36.7 C). His blood pressure is 120/70 and his pulse is 64. His respiration is 18 and oxygen saturation is 100%.   Wt Readings from Last 3 Encounters:  08/19/18 138 lb (62.6 kg)  07/22/18 137 lb 11.2 oz (62.5 kg)  06/24/18 137 lb (62.1 kg)    Ocular: Sclerae unicteric, pupils equal, round and reactive to light Ear-nose-throat: Oropharynx clear, dentition fair Lymphatic: No cervical or supraclavicular adenopathy Lungs no rales or rhonchi, good excursion bilaterally Heart regular rate and rhythm, no murmur appreciated Abd soft, nontender, positive bowel sounds, no liver or spleen tip palpated on exam, no fluid wave  MSK no focal spinal tenderness, no joint edema Neuro: non-focal, well-oriented, appropriate affect Breasts: Deferred   Lab Results  Component Value Date   WBC 5.3 08/19/2018   HGB 12.4 (L) 08/19/2018   HCT 37.7 (L) 08/19/2018   MCV 94.0 08/19/2018   PLT 201 08/19/2018   Lab Results  Component Value Date   FERRITIN 100 07/22/2018   IRON 75  07/22/2018   TIBC 174 (L) 07/22/2018   UIBC 99 (L) 07/22/2018   IRONPCTSAT 43 07/22/2018   Lab Results  Component Value Date   RETICCTPCT 2.1 06/11/2018   RBC 4.01 (L) 08/19/2018   No results found for: KPAFRELGTCHN, LAMBDASER, KAPLAMBRATIO No results found for: IGGSERUM, IGA, IGMSERUM No results found for: Odetta Pink, SPEI   Chemistry      Component Value Date/Time   NA 134 (L) 08/19/2018 1524   NA 140 04/10/2017 1046   NA 135 (L) 03/06/2017 1104   K 4.1 08/19/2018 1524   K 3.7 04/10/2017 1046   K 4.2 03/06/2017 1104   CL 98 08/19/2018 1524   CL 98 04/10/2017 1046   CO2 30 08/19/2018 1524   CO2 31 04/10/2017 1046   CO2 30 (H) 03/06/2017 1104   BUN 18 08/19/2018 1524   BUN 21 04/10/2017 1046   BUN 20.0 03/06/2017 1104   CREATININE 0.91 08/19/2018 1524   CREATININE 0.9 04/10/2017 1046   CREATININE 1.0 03/06/2017 1104      Component Value Date/Time   CALCIUM 9.0 08/19/2018 1524   CALCIUM 8.9 04/10/2017 1046   CALCIUM 9.4 03/06/2017 1104   ALKPHOS 56 08/19/2018 1524   ALKPHOS 65 04/10/2017 1046   ALKPHOS 78 03/06/2017 1104   AST 11 (L) 08/19/2018 1524   AST 20 03/06/2017 1104   ALT 12 08/19/2018 1524   ALT 21 04/10/2017 1046   ALT 20 03/06/2017 1104   BILITOT 0.6 08/19/2018 1524   BILITOT 0.47 03/06/2017 1104       Impression and Plan: Mr. Withey is a very pleasant 73 yo caucasian gentleman with history of hemochromatosis, homozygous for the C282Y mutation which has not been a problem for him. He also has history of stage IIIC nodular melanoma of the left thigh. He completed treatment with Opdivo in October 2019.  He continues to do well and has no complaints at this time.  We will plan to repeat a PET scan in October.  We will plan to see him back in another 2 months.  He will contact our office with any questions or concerns. We can certainly see him sooner if need be.   Laverna Peace, NP  5/13/20204:35 PM

## 2018-08-19 NOTE — Patient Instructions (Signed)

## 2018-08-20 ENCOUNTER — Ambulatory Visit (INDEPENDENT_AMBULATORY_CARE_PROVIDER_SITE_OTHER): Payer: Medicare Other | Admitting: *Deleted

## 2018-08-20 ENCOUNTER — Telehealth: Payer: Self-pay | Admitting: Family

## 2018-08-20 DIAGNOSIS — Z0000 Encounter for general adult medical examination without abnormal findings: Secondary | ICD-10-CM | POA: Diagnosis not present

## 2018-08-20 LAB — IRON AND TIBC
Iron: 67 ug/dL (ref 42–163)
Saturation Ratios: 37 % (ref 20–55)
TIBC: 181 ug/dL — ABNORMAL LOW (ref 202–409)
UIBC: 115 ug/dL — ABNORMAL LOW (ref 117–376)

## 2018-08-20 LAB — FERRITIN: Ferritin: 74 ng/mL (ref 24–336)

## 2018-08-20 LAB — LACTATE DEHYDROGENASE: LDH: 155 U/L (ref 98–192)

## 2018-08-20 NOTE — Telephone Encounter (Signed)
Appointments scheduled letter/calendar mailed per 5/ 13 los

## 2018-08-20 NOTE — Patient Instructions (Signed)
Continue doing brain stimulating activities (puzzles, reading, adult coloring books, staying active) to keep memory sharp.   Continue to eat heart healthy diet (full of fruits, vegetables, whole grains, lean protein, water--limit salt, fat, and sugar intake) and increase physical activity as tolerated.   [name] , Thank you for taking time to come for your Medicare Wellness Visit. I appreciate your ongoing commitment to your health goals. Please review the following plan we discussed and let me know if I can assist you in the future.   These are the goals we discussed: Goals    . Patient Stated    . Patient Stated     Continue to work towards getting stronger and healthier by staying physically and socially active and by eating healthy.       This is a list of the screening recommended for you and due dates:  Health Maintenance  Topic Date Due  .  Hepatitis C: One time screening is recommended by Center for Disease Control  (CDC) for  adults born from [birth year] through 1963.   [birth year]  . Colon Cancer Screening  01/30/2017  . Flu Shot  11/07/2018  . Tetanus Vaccine  07/22/2021  . Pneumonia vaccines  Completed    Preventive Care 30 Years and Older, Male Preventive care refers to lifestyle choices and visits with your health care provider that can promote health and wellness. What does preventive care include?   A yearly physical exam. This is also called an annual well check.  Dental exams once or twice a year.  Routine eye exams. Ask your health care provider how often you should have your eyes checked.  Personal lifestyle choices, including: ? Daily care of your teeth and gums. ? Regular physical activity. ? Eating a healthy diet. ? Avoiding tobacco and drug use. ? Limiting alcohol use. ? Practicing safe sex. ? Taking low doses of aspirin every day. ? Taking vitamin and mineral supplements as recommended by your health care provider. What happens during an annual well  check? The services and screenings done by your health care provider during your annual well check will depend on your age, overall health, lifestyle risk factors, and family history of disease. Counseling Your health care provider may ask you questions about your:  Alcohol use.  Tobacco use.  Drug use.  Emotional well-being.  Home and relationship well-being.  Sexual activity.  Eating habits.  History of falls.  Memory and ability to understand (cognition).  Work and work Statistician. Screening You may have the following tests or measurements:  Height, weight, and BMI.  Blood pressure.  Lipid and cholesterol levels. These may be checked every 5 years, or more frequently if you are over 71 years old.  Skin check.  Lung cancer screening. You may have this screening every year starting at age 71 if you have a 30-pack-year history of smoking and currently smoke or have quit within the past 15 years.  Colorectal cancer screening. All adults should have this screening starting at age 50 and continuing until age 71. You will have tests every 1-10 years, depending on your results and the type of screening test. People at increased risk should start screening at an earlier age. Screening tests may include: ? Guaiac-based fecal occult blood testing. ? Fecal immunochemical test (FIT). ? Stool DNA test. ? Virtual colonoscopy. ? Sigmoidoscopy. During this test, a flexible tube with a tiny camera (sigmoidoscope) is used to examine your rectum and lower colon. The sigmoidoscope is inserted  through your anus into your rectum and lower colon. ? Colonoscopy. During this test, a long, thin, flexible tube with a tiny camera (colonoscope) is used to examine your entire colon and rectum.  Prostate cancer screening. Recommendations will vary depending on your family history and other risks.  Hepatitis C blood test.  Hepatitis B blood test.  Sexually transmitted disease (STD) testing.   Diabetes screening. This is done by checking your blood sugar (glucose) after you have not eaten for a while (fasting). You may have this done every 1-3 years.  Abdominal aortic aneurysm (AAA) screening. You may need this if you are a current or former smoker.  Osteoporosis. You may be screened starting at age 71 if you are at high risk. Talk with your health care provider about your test results, treatment options, and if necessary, the need for more tests. Vaccines Your health care provider may recommend certain vaccines, such as:  Influenza vaccine. This is recommended every year.  Tetanus, diphtheria, and acellular pertussis (Tdap, Td) vaccine. You may need a Td booster every 10 years.  Varicella vaccine. You may need this if you have not been vaccinated.  Zoster vaccine. You may need this after age 31.  Measles, mumps, and rubella (MMR) vaccine. You may need at least one dose of MMR if you were born in 1957 or later. You may also need a second dose.  Pneumococcal 13-valent conjugate (PCV13) vaccine. One dose is recommended after age 57.  Pneumococcal polysaccharide (PPSV23) vaccine. One dose is recommended after age 39.  Meningococcal vaccine. You may need this if you have certain conditions.  Hepatitis A vaccine. You may need this if you have certain conditions or if you travel or work in places where you may be exposed to hepatitis A.  Hepatitis B vaccine. You may need this if you have certain conditions or if you travel or work in places where you may be exposed to hepatitis B.  Haemophilus influenzae type b (Hib) vaccine. You may need this if you have certain risk factors. Talk to your health care provider about which screenings and vaccines you need and how often you need them. This information is not intended to replace advice given to you by your health care provider. Make sure you discuss any questions you have with your health care provider. Document Released: 04/21/2015  Document Revised: 05/15/2017 Document Reviewed: 01/24/2015 Elsevier Interactive Patient Education  2019 [employer].

## 2018-08-21 ENCOUNTER — Ambulatory Visit: Payer: Medicare Other | Admitting: Family

## 2018-08-21 ENCOUNTER — Other Ambulatory Visit: Payer: Medicare Other

## 2018-08-21 ENCOUNTER — Telehealth: Payer: Self-pay | Admitting: *Deleted

## 2018-08-21 NOTE — Telephone Encounter (Signed)
Call received from patient to receive his Ferritin level.  Pt informed that his Ferritin level is 74 and that no phlebotomy is needed at this time.  Patient appreciative of information and has no further questions or concerns at this time.

## 2018-08-28 ENCOUNTER — Other Ambulatory Visit: Payer: Self-pay

## 2018-08-28 ENCOUNTER — Ambulatory Visit (AMBULATORY_SURGERY_CENTER): Payer: Self-pay

## 2018-08-28 VITALS — Ht 66.0 in | Wt 135.0 lb

## 2018-08-28 DIAGNOSIS — Z86010 Personal history of colonic polyps: Secondary | ICD-10-CM

## 2018-08-28 MED ORDER — PEG 3350-KCL-NA BICARB-NACL 420 G PO SOLR: 4000.0000 mL | Freq: Once | ORAL | 0 refills | Status: AC

## 2018-08-28 NOTE — Progress Notes (Signed)
Denies allergies to eggs or soy products. Denies complication of anesthesia or sedation. Denies use of weight loss medication. Denies use of O2.   Emmi instructions given for colonoscopy.   Pre-Visit was conducted by phone due to Covid 19. Instructions were reviewed and mailed to confirmed home address. Patient was encouraged to call if he had any questions or concerns regarding instructions.

## 2018-09-01 ENCOUNTER — Telehealth: Payer: Self-pay | Admitting: *Deleted

## 2018-09-01 NOTE — Telephone Encounter (Signed)
Call received from patient stating that he had diarrhea last week for the entire week, which has now stopped and that he now has no appetite which started mid week, last week and has lost four pounds since last week.  He states that he has taken one Marinol pill in hopes it would help his appetite.  Informed pt that I would speak with Dr. Marin Olp and call him back with orders from Dr. Marin Olp.

## 2018-09-01 NOTE — Telephone Encounter (Signed)
Call placed back to patient and patient notified per order of Dr. Marin Olp to continue Marinol as prescribed.  Patient states that he will continue Marinol and call office back with any further difficulties.

## 2018-09-04 ENCOUNTER — Other Ambulatory Visit: Payer: Self-pay | Admitting: Internal Medicine

## 2018-09-09 ENCOUNTER — Telehealth: Payer: Self-pay | Admitting: *Deleted

## 2018-09-09 NOTE — Telephone Encounter (Signed)
Covid-19 screening questions  Have you traveled in the last 14 days? no If yes where?  Do you now or have you had a fever in the last 14 days? no  Do you have any respiratory symptoms of shortness of breath or cough now or in the last 14 days? no  Do you have any family members or close contacts with diagnosed or suspected Covid-19 in the past 14 days? no  Have you been tested for Covid-19 and found to be positive?  Pt is aware that care partner will wait in the car during parking lot; if they feel like they will be too hot to wait in the car; they may wait in the lobby.  We want them to wear a mask (we do not have any that we can provide them), practice social distancing, and we will check their temperatures when they get here.  I did remind patient that their care partner needs to stay in the parking lot the entire time. Pt will wear mask into building

## 2018-09-11 ENCOUNTER — Other Ambulatory Visit: Payer: Self-pay

## 2018-09-11 ENCOUNTER — Encounter: Payer: Self-pay | Admitting: Gastroenterology

## 2018-09-11 ENCOUNTER — Ambulatory Visit (AMBULATORY_SURGERY_CENTER): Payer: Medicare Other | Admitting: Gastroenterology

## 2018-09-11 VITALS — BP 116/61 | HR 57 | Temp 98.5°F | Resp 12 | Ht 66.0 in | Wt 135.0 lb

## 2018-09-11 DIAGNOSIS — Z86010 Personal history of colonic polyps: Secondary | ICD-10-CM | POA: Diagnosis not present

## 2018-09-11 DIAGNOSIS — K5730 Diverticulosis of large intestine without perforation or abscess without bleeding: Secondary | ICD-10-CM

## 2018-09-11 DIAGNOSIS — D123 Benign neoplasm of transverse colon: Secondary | ICD-10-CM

## 2018-09-11 DIAGNOSIS — D122 Benign neoplasm of ascending colon: Secondary | ICD-10-CM

## 2018-09-11 MED ORDER — SODIUM CHLORIDE 0.9 % IV SOLN: 500.0000 mL | Freq: Once | INTRAVENOUS | Status: DC

## 2018-09-11 NOTE — Patient Instructions (Signed)
Discharge instructions given. Handouts on polyps and Diverticulosis. Resume Plavix tomorrow. Resume previous medications. YOU HAD AN ENDOSCOPIC PROCEDURE TODAY AT [facility]:   Refer to the procedure report that was given to you for any specific questions about what was found during the examination.  If the procedure report does not answer your questions, please call your gastroenterologist to clarify.  If you requested that your care partner not be given the details of your procedure findings, then the procedure report has been included in a sealed envelope for you to review at your convenience later.  YOU SHOULD EXPECT: Some feelings of bloating in the abdomen. Passage of more gas than usual.  Walking can help get rid of the air that was put into your GI tract during the procedure and reduce the bloating. If you had a lower endoscopy (such as a colonoscopy or flexible sigmoidoscopy) you may notice spotting of blood in your stool or on the toilet paper. If you underwent a bowel prep for your procedure, you may not have a normal bowel movement for a few days.  Please Note:  You might notice some irritation and congestion in your nose or some drainage.  This is from the oxygen used during your procedure.  There is no need for concern and it should clear up in a day or so.  SYMPTOMS TO REPORT IMMEDIATELY:   Following lower endoscopy (colonoscopy or flexible sigmoidoscopy):  Excessive amounts of blood in the stool  Significant tenderness or worsening of abdominal pains  Swelling of the abdomen that is new, acute  Fever of 100F or higher   For urgent or emergent issues, a gastroenterologist can be reached at any hour by calling [phone].   DIET:  We do recommend a small meal at first, but then you may proceed to your regular diet.  Drink plenty of fluids but you should avoid alcoholic beverages for 24 hours.  ACTIVITY:  You should plan to take it easy for the rest of  today and you should NOT DRIVE or use heavy machinery until tomorrow (because of the sedation medicines used during the test).    FOLLOW UP: Our staff will call the number listed on your records 48-72 hours following your procedure to check on you and address any questions or concerns that you may have regarding the information given to you following your procedure. If we do not reach you, we will leave a message.  We will attempt to reach you two times.  During this call, we will ask if you have developed any symptoms of COVID 19. If you develop any symptoms (ie: fever, flu-like symptoms, shortness of breath, cough etc.) before then, please call [phone].  If you test positive for Covid 19 in the 2 weeks post procedure, please call and report this information to [place].    If any biopsies were taken you will be contacted by phone or by letter within the next 1-3 weeks.  Please call us at [phone] if you have not heard about the biopsies in 3 weeks.    SIGNATURES/CONFIDENTIALITY: You and/or your care partner have signed paperwork which will be entered into your electronic medical record.  These signatures attest to the fact that that the information above on your After Visit Summary has been reviewed and is understood.  Full responsibility of the confidentiality of this discharge information lies with you and/or your care-partner.

## 2018-09-11 NOTE — Progress Notes (Signed)
Called to room to assist during endoscopic procedure.  Patient ID and intended procedure confirmed with present staff. Received instructions for my participation in the procedure from the performing physician.  

## 2018-09-11 NOTE — Progress Notes (Signed)
PT taken to PACU. Monitors in place. VSS. Report given to RN. 

## 2018-09-11 NOTE — Op Note (Addendum)
[facility] Patient Name: [name] Procedure Date: 09/11/2018 10:54 AM MRN: [id number] Endoscopist: Milus Banister , MD Age: 73 Referring MD:  Date of Birth: 1945/11/06 Gender: Male Account #: [id number] Procedure:                Colonoscopy Indications:              High risk colon cancer surveillance: Personal                            history of colonic polyps; Adenomatous colon                            polyps;?Colonoscopy Dr. Ardis Hughs 01/2012, two subCM                            polyps, not precancerous.                            ?several?previous?colonsocopies with Dr. Oletta Lamas,                            most recent was 2007, "fragments of adenoma". He                            tells me he was getting about annual colonoscopy                            with Dr. Oletta Lamas for many years for multiple                            polyps, precancerous Medicines:                Monitored Anesthesia Care Procedure:                Pre-Anesthesia Assessment:                           - Prior to the procedure, a History and Physical                            was performed, and patient medications and                            allergies were reviewed. The patient's tolerance of                            previous anesthesia was also reviewed. The risks                            and benefits of the procedure and the sedation                            options and risks were discussed with the patient.  All questions were answered, and informed consent                            was obtained. Prior Anticoagulants: He stopped                            plavix 5 days ago. ASA Grade Assessment: II - A                            patient with mild systemic disease. After reviewing                            the risks and benefits, the patient was deemed in                            satisfactory condition to undergo the procedure.          After obtaining informed consent, the colonoscope                            was passed under direct vision. Throughout the                            procedure, the patient's blood pressure, pulse, and                            oxygen saturations were monitored continuously. The                            Colonoscope was introduced through the anus and                            advanced to the the cecum, identified by                            appendiceal orifice and ileocecal valve. The                            colonoscopy was performed without difficulty. The                            patient tolerated the procedure well. The quality                            of the bowel preparation was good. The ileocecal                            valve, appendiceal orifice, and rectum were                            photographed. Scope In: 11:00:16 AM Scope Out: 11:26:28 AM Scope Withdrawal Time: 0 hours 18 minutes 24 seconds  Total Procedure Duration: 0 hours 26 minutes 12 seconds  Findings:  Four sessile polyps were found in the ascending                            colon. The polyps were 2 to 4 mm in size. These                            polyps were removed with a cold snare. Resection                            and retrieval were complete.                           Two semi-pedunculated polyps were found in the                            transverse colon. The polyps were 8 to 12 mm in                            size. These polyps were removed with a hot snare.                            Resection and retrieval were complete.                           Five sessile polyps were found in the transverse                            colon. The polyps were 3 to 7 mm in size. These                            polyps were removed with a cold snare. Resection                            and retrieval were complete.                           Multiple small and large-mouthed  diverticula were                            found in the left colon.                           The exam was otherwise without abnormality on                            direct and retroflexion views. Complications:            No immediate complications. Estimated blood loss:                            None. Estimated Blood Loss:     Estimated blood loss: none. Impression:               - Four 2 to 4 mm polyps in the ascending colon,  removed with a cold snare. Resected and retrieved.                           - Two 8 to 12 mm polyps in the transverse colon,                            removed with a hot snare. Resected and retrieved.                           - Five 3 to 7 mm polyps in the transverse colon,                            removed with a cold snare. Resected and retrieved.                           - Diverticulosis in the left colon.                           - The examination was otherwise normal on direct                            and retroflexion views. Recommendation:           - Patient has a contact number available for                            emergencies. The signs and symptoms of potential                            delayed complications were discussed with the                            patient. Return to normal activities tomorrow.                            Written discharge instructions were provided to the                            patient.                           - Resume previous diet.                           - Continue present medications. OK to resume plavix                            tomorrow.                           - Repeat colonoscopy is recommended. The                            colonoscopy date will be determined after pathology  results from today's exam become available for                            review. Milus Banister, MD 09/11/2018 11:41:39 AM This report has been signed  electronically.

## 2018-09-14 ENCOUNTER — Telehealth: Payer: Self-pay | Admitting: Gastroenterology

## 2018-09-14 NOTE — Telephone Encounter (Signed)
Patient called would like to know if we have receieved that pathology report from his colonoscopy that he had on 09-11-18

## 2018-09-14 NOTE — Telephone Encounter (Signed)
Patient told [facility] does not have the pathology report back from his colonoscopy. This RN reported to the patient that when the results were in, he would be notified. Nothing further at the time of phone call.

## 2018-09-15 ENCOUNTER — Telehealth: Payer: Self-pay

## 2018-09-15 NOTE — Telephone Encounter (Signed)
Left message on follow up call. 

## 2018-09-15 NOTE — Telephone Encounter (Signed)
  Follow up Call-  Call back number 09/11/2018  Post procedure Call Back phone  # [phone]  Permission to leave phone message Yes  Some recent data might be hidden     Patient questions:  Do you have a fever, pain , or abdominal swelling? No. Pain Score  0 *  Have you tolerated food without any problems? Yes.    Have you been able to return to your normal activities? Yes.    Do you have any questions about your discharge instructions: Diet   No. Medications  No. Follow up visit  No.  Do you have questions or concerns about your Care? No.  Actions: * If pain score is 4 or above: No action needed, pain <4. 1. Have you developed a fever since your procedure? no  2.   Have you had an respiratory symptoms (SOB or cough) since your procedure? no  3.   Have you tested positive for COVID 19 since your procedure no  4.   Have you had any family members/close contacts diagnosed with the COVID 19 since your procedure?  no   If yes to any of these questions please route to Joylene John, RN and Alphonsa Gin, Therapist, sports.

## 2018-09-21 ENCOUNTER — Encounter: Payer: Self-pay | Admitting: Gastroenterology

## 2018-09-21 ENCOUNTER — Telehealth: Payer: Self-pay

## 2018-09-21 NOTE — Telephone Encounter (Signed)
Left message for patient to call back. Patient will need to change appointment to a virtual visit or reschedule in September.

## 2018-09-23 NOTE — Telephone Encounter (Signed)
Virtual Visit Pre-Appointment Phone Call  "(Name), I am calling you today to discuss your upcoming appointment. We are currently trying to limit exposure to the virus that causes COVID-19 by seeing patients at home rather than in the office."  1. "What is the BEST phone number to call the day of the visit?" - include this in appointment notes  2. "Do you have or have access to (through a family member/friend) a smartphone with video capability that we can use for your visit?" a. If yes - list this number in appt notes as "cell" (if different from BEST phone #) and list the appointment type as a VIDEO visit in appointment notes b. If no - list the appointment type as a PHONE visit in appointment notes  3. Confirm consent - "In the setting of the current Covid19 crisis, you are scheduled for a (phone or video) visit with your provider on (date) at (time).  Just as we do with many in-office visits, in order for you to participate in this visit, we must obtain consent.  If you'd like, I can send this to your mychart (if signed up) or email for you to review.  Otherwise, I can obtain your verbal consent now.  All virtual visits are billed to your insurance company just like a normal visit would be.  By agreeing to a virtual visit, we'd like you to understand that the technology does not allow for your provider to perform an examination, and thus may limit your provider's ability to fully assess your condition. If your provider identifies any concerns that need to be evaluated in person, we will make arrangements to do so.  Finally, though the technology is pretty good, we cannot assure that it will always work on either your or our end, and in the setting of a video visit, we may have to convert it to a phone-only visit.  In either situation, we cannot ensure that we have a secure connection.  Are you willing to proceed? Yes  4. Advise patient to be prepared - "Two hours prior to your appointment, go  ahead and check your blood pressure, pulse, oxygen saturation, and your weight (if you have the equipment to check those) and write them all down. When your visit starts, your provider will ask you for this information. If you have an Apple Watch or Kardia device, please plan to have heart rate information ready on the day of your appointment. Please have a pen and paper handy nearby the day of the visit as well."  5. Give patient instructions for MyChart download to smartphone OR [email or web address] as below if video visit (depending on what platform provider is using)  6. Inform patient they will receive a phone call 15 minutes prior to their appointment time (may be from unknown caller ID) so they should be prepared to answer    TELEPHONE CALL NOTE  [name] has been deemed a candidate for a follow-up tele-health visit to limit community exposure during the Covid-19 pandemic. I spoke with the patient via phone to ensure availability of phone/video source, confirm preferred email & phone number, and discuss instructions and expectations.  I reminded [name] to be prepared with any vital sign and/or heart rhythm information that could potentially be obtained via home monitoring, at the time of his visit. I reminded [name] to expect a phone call prior to his visit.  Michaelyn Barter, RN 09/23/2018 9:51 AM   IF  USING DOXIMITY or DOXY.ME - The patient will receive a link just prior to their visit by text.     FULL LENGTH CONSENT FOR TELE-HEALTH VISIT   I hereby voluntarily request, consent and authorize [facility] and its employed or contracted physicians, physician assistants, nurse practitioners or other licensed health care professionals (the Practitioner), to provide me with telemedicine health care services (the "Services") as deemed necessary by the treating Practitioner. I acknowledge and consent to receive the Services by the Practitioner via  telemedicine. I understand that the telemedicine visit will involve communicating with the Practitioner through live audiovisual communication technology and the disclosure of certain medical information by electronic transmission. I acknowledge that I have been given the opportunity to request an in-person assessment or other available alternative prior to the telemedicine visit and am voluntarily participating in the telemedicine visit.  I understand that I have the right to withhold or withdraw my consent to the use of telemedicine in the course of my care at any time, without affecting my right to future care or treatment, and that the Practitioner or I may terminate the telemedicine visit at any time. I understand that I have the right to inspect all information obtained and/or recorded in the course of the telemedicine visit and may receive copies of available information for a reasonable fee.  I understand that some of the potential risks of receiving the Services via telemedicine include:  [name] Delay or interruption in medical evaluation due to technological equipment failure or disruption; . Information transmitted may not be sufficient (e.g. poor resolution of images) to allow for appropriate medical decision making by the Practitioner; and/or  . In rare instances, security protocols could fail, causing a breach of personal health information.  Furthermore, I acknowledge that it is my responsibility to provide information about my medical history, conditions and care that is complete and accurate to the best of my ability. I acknowledge that Practitioner's advice, recommendations, and/or decision may be based on factors not within their control, such as incomplete or inaccurate data provided by me or distortions of diagnostic images or specimens that may result from electronic transmissions. I understand that the practice of medicine is not an exact science and that Practitioner makes no warranties or  guarantees regarding treatment outcomes. I acknowledge that I will receive a copy of this consent concurrently upon execution via email to the email address I last provided but may also request a printed copy by calling the office of [facility].    I understand that my insurance will be billed for this visit.   I have read or had this consent read to me. . I understand the contents of this consent, which adequately explains the benefits and risks of the Services being provided via telemedicine.  . I have been provided ample opportunity to ask questions regarding this consent and the Services and have had my questions answered to my satisfaction. . I give my informed consent for the services to be provided through the use of telemedicine in my medical care  By participating in this telemedicine visit I agree to the above.

## 2018-09-24 NOTE — Progress Notes (Signed)
Virtual Visit via Video Note   This visit type was conducted due to national recommendations for restrictions regarding the COVID-19 Pandemic (e.g. social distancing) in an effort to limit this patient's exposure and mitigate transmission in our community.  Due to his co-morbid illnesses, this patient is at least at moderate risk for complications without adequate follow up.  This format is felt to be most appropriate for this patient at this time.  All issues noted in this document were discussed and addressed.  A limited physical exam was performed with this format.  Please refer to the patient's chart for his consent to telehealth for [facility].   Date:  09/28/2018   ID:  [name], DOB [birth year], MRN [id number]  Patient Location: Home Provider Location:  Office   PCP:  Cassandria Anger, MD  Cardiologist:   Johnsie Cancel Electrophysiologist:  None   Evaluation Performed:  Follow-Up Visit  Chief Complaint:  CAD  History of Present Illness:    73 y.o. male with a hx of CAD, paroxysmal atrial fibrillation not on systemic anticoagulation given need for DAPT, hyperlipidemia, COPD, GERD, obstructive sleep apnea, rheumatoid arthritis, and hemochromatosis. He had left circumflex stent with spiral dissection in 2006. He had TEE DCCV in May 2012. He had h/o post lumbar fusion surgical wound infection with positive blood culture of pseudomonas and required 6 weeks of IV antibiotics near the end of 2014. He was admitted in March 2016 with chest tightness and dyspnea and underwent cardiac catheterization which showed new stenosis in proximal to mid LAD treated with overlapping drug-eluting stents. He was started on aspirin and Plavix. Sees ortho at [place] Dr. Pasty Spillers.   He has melanoma of left thigh excised NOvember 2018 bu Stage 3C Getting adjuvant Nivolumab every 4 weeks And phlebotomy to maintain ferritin less than 100   Has had diarrhea since Rx for melanomas Had colonoscopy  09/11/18 with multiple polyps removed by Dr Ardis Hughs anxious has not had results of pathology back yet   Living at [facility] now   The patient  does not have symptoms concerning for COVID-19 infection (fever, chills, cough, or new shortness of breath).    Past Medical History:  Diagnosis Date  . Alcoholism /alcohol abuse ([place])    per family  . Allergic rhinitis   . Allergy   . Anxiety   . Bacterial infection   . CAD (coronary artery disease)    minimal coronary plaque in the LAD and right coronary system. 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 7-06. Last nuclear stress 11-17-06 fixed anterior/ inferior defect, no inducible ischemia, EF 81%  . Cataract   . CHF (congestive heart failure) ([place])   . Chronic back pain    "all over back"  . Chronic neck pain   . COPD (chronic obstructive pulmonary disease) ([place])   . Depression   . Diverticulosis   . Dyspnea    with exertion  . Dysrhythmia 01-24-12   past hx. A.Fib x1 episode-responded to med.  . Falls frequently    "since 02/2013" (06/16/2013)  . Family history of cancer   . Genetic testing 03/06/2017   Multi-Cancer panel (83 genes) @ [facility] - No pathogenic mutations detected  . GERD (gastroesophageal reflux disease)   . Hemochromatosis    dx'd 14 yrs ago last ferritin Aug 11, 08 52 (22-322), Fe 136 ("I had 250 phlebotomies for that")  . High cholesterol    hx  .  Hx of colonic polyps   . Hx of colonoscopy   . Hypertension   . Iron deficiency anemia due to chronic blood loss 12/27/2016  . Malignant melanoma of knee, left ([place])   . Myocardial infarction [facility]) 2006   "related to catheterization"  . Narcotic abuse ([place])    per family  . Osteoarthritis   . PONV (postoperative nausea and vomiting)   . RA (rheumatoid arthritis) ([place])    Past Surgical History:  Procedure Laterality Date  . ABDOMINAL ADHESION SURGERY  ~ 1968  . ANKLE RECONSTRUCTION Right 6-09    [facility]  . APPENDECTOMY  ~ 1956  . ASPIRATION OF ABSCESS Left 03/27/2017   Procedure: ASPIRATION OF SEROMA;  Surgeon: Stark Klein, MD;  Location: [place];  Service: General;  Laterality: Left;  . BONE TUMOR RESECTION  ~ 1954   "taken off my mastoid"  . CARPAL TUNNEL RELEASE Right 1990's  . CATARACT EXTRACTION, BILATERAL Bilateral 01-24-12  . CORONARY ANGIOPLASTY WITH STENT PLACEMENT  2006   "while repairing 1st stent, a second area tore and they had to place 2nd stent " ?LAD & CX  . CORONARY ANGIOPLASTY WITH STENT PLACEMENT  06/17/2014  . CYST EXCISION  "several OR's"   "backX 2, back of my neck, face, inside right bicept, chest, wrist"  . FOOT SURGERY Right 11-08   for removal of bone spurs-  . HAMMER TOE SURGERY Right 07/2012   "broke 4 hammertoes"   . HARDWARE REMOVAL  03/09/2012   Procedure: HARDWARE REMOVAL;  Surgeon: Nita Sells, MD;  Location: [place];  Service: Orthopedics;  Laterality: Left;  Hardware Removal from Left Shoulder  . HARVEST BONE GRAFT  02/06/2012   Procedure: HARVEST ILIAC BONE GRAFT;  Surgeon: Nita Sells, MD;  Location: [facility];  Service: Orthopedics;;  bone marrow aspirqation   . INGUINAL HERNIA REPAIR Bilateral   . JOINT REPLACEMENT    . KNEE SURGERY Left ~ 2003   "6-12 months after uni knee removed synovial sack"  . LEFT HEART CATHETERIZATION WITH CORONARY ANGIOGRAM N/A 06/17/2014   PCI of diffuse severe stenosis in the proximal to mid LAD using overlapping drug-eluting stents.  . LUMBAR WOUND DEBRIDEMENT N/A 03/17/2013   Procedure: Incision and drainage of superficial lumbar wound;  Surgeon: Floyce Stakes, MD;  Location: [place] [facility];  Service: Neurosurgery;  Laterality: N/A;  Incision and drainage of superficial lumbar wound  . MECKEL DIVERTICULUM EXCISION  ~ 1956  . MELANOMA EXCISION WITH SENTINEL LYMPH NODE BIOPSY Left 02/12/2017   WIDE LOCAL EXCISION LEFT KNEE MELANOMA, ADVANCEMENT FLAP CLOSURE,  AND SENTINEL LYMPH NODE MAPPING AND BIOPSY.  [name] MELANOMA EXCISION WITH SENTINEL LYMPH NODE BIOPSY Left 02/12/2017   Procedure: WIDE LOCAL EXCISION LEFT KNEE MELANOMA, ADVANCEMENT FLAP CLOSURE, AND SENTINEL LYMPH NODE MAPPING AND BIOPSY.;  Surgeon: Stark Klein, MD;  Location: [place];  Service: General;  Laterality: Left;  GENERAL AND LOCAL  . ORIF SHOULDER FRACTURE  02/06/2012   Procedure: OPEN REDUCTION INTERNAL FIXATION (ORIF) SHOULDER FRACTURE;  Surgeon: Nita Sells, MD;  Location: [facility];  Service: Orthopedics;  Laterality: Left;  ORIF of a Left Shoulder Fracture with  Iliac Crest Bone Graft aspiration   . PORTACATH PLACEMENT Left 03/27/2017   Procedure: INSERTION PORT-A-CATH;  Surgeon: Stark Klein, MD;  Location: [place];  Service: General;  Laterality: Left;  . POSTERIOR LUMBAR FUSION  12-10   L4-5 diskectomy w/ fusion, cage placement and rods; [place]  .  POSTERIOR LUMBAR FUSION 4 LEVEL N/A 03/02/2013   Procedure: Lumbar One to Sacral One Posterior lumbar interbody fusion;  Surgeon: Floyce Stakes, MD;  Location: [place] [facility];  Service: Neurosurgery;  Laterality: N/A;  L1 to S1 Posterior lumbar interbody fusion  . REPLACEMENT UNICONDYLAR JOINT KNEE Left ~ 2003   "~ 6 months after total knee replaced"  . SHOULDER ARTHROSCOPY Left ~ 2004 X 2   "@ [facility]; left bone splinter in & had to clean it out"  . TOTAL ANKLE REPLACEMENT Right 2008   at [facility]  . TOTAL KNEE ARTHROPLASTY Bilateral 2002  . TOTAL SHOULDER REPLACEMENT Left 2006  . TOTAL SHOULDER REPLACEMENT Right ~ 2007   Dr. Marlou Sa     Current Meds  Medication Sig  . albuterol (PROVENTIL HFA;VENTOLIN HFA) 108 (90 Base) MCG/ACT inhaler Inhale 1-2 puffs into the lungs every 4 (four) hours as needed for wheezing or shortness of breath.  . alfuzosin (UROXATRAL) 10 MG 24 hr tablet Take 10 mg by mouth at bedtime.   . ALPRAZolam (XANAX) 0.5 MG tablet TAKE 1 TABLET BY MOUTH TWICE DAILY AS NEEDED FOR ANXIETY  .  Cholecalciferol (VITAMIN D3) 2000 UNITS capsule Take 1 capsule (2,000 Units total) by mouth daily.  . clopidogrel (PLAVIX) 75 MG tablet TAKE 1 TABLET BY MOUTH EVERY DAY WITH BREAKFAST  . dronabinol (MARINOL) 5 MG capsule Take 1 capsule (5 mg total) by mouth 2 (two) times daily before a meal.  . FLUoxetine (PROZAC) 40 MG capsule TAKE 1 CAPSULE BY MOUTH TWICE DAILY  . fluticasone (FLONASE) 50 MCG/ACT nasal spray INSTILL 2 SPRAYS IN EACH NOSTRIL EVERY DAY  . gabapentin (NEURONTIN) 300 MG capsule Take 1 capsule (300 mg total) by mouth 2 (two) times daily.  [name] HYDROmorphone (DILAUDID) 2 MG tablet Take 1 tablet (2 mg total) by mouth every 8 (eight) hours as needed for severe pain.  [name] lidocaine-prilocaine (EMLA) cream Apply 1 application topically as needed.  [name] losartan-hydrochlorothiazide (HYZAAR) 50-12.5 MG tablet Take 1 tablet by mouth daily.  . Melatonin 5 MG TABS Take 5 mg by mouth at bedtime. Takes 10 mg at HS  . metoprolol tartrate (LOPRESSOR) 25 MG tablet Take 0.5 tablets (12.5 mg total) by mouth 2 (two) times daily.  . Omega-3 Fatty Acids (FISH OIL OMEGA-3) 1000 MG CAPS Take 1,000 mg by mouth daily.   . ondansetron (ZOFRAN) 8 MG tablet Take 1 tablet (8 mg total) by mouth 3 (three) times daily.  . pantoprazole (PROTONIX) 40 MG tablet Take 1 tablet (40 mg total) by mouth every evening.  . pravastatin (PRAVACHOL) 20 MG tablet Take 1 tablet (20 mg total) by mouth daily. (Patient taking differently: Take 20 mg by mouth every evening. )  . predniSONE (DELTASONE) 5 MG tablet Take 2 tablets (10 mg total) by mouth daily with breakfast.  . prochlorperazine (COMPAZINE) 10 MG tablet TAKE 1 TABLET(10 MG) BY MOUTH EVERY 6 HOURS AS NEEDED FOR NAUSEA OR VOMITING  . Pyridoxine HCl (B-6 PO) Take 1 tablet by mouth.  . tadalafil (CIALIS) 5 MG tablet Take 5 mg by mouth as needed.  . torsemide (DEMADEX) 10 MG tablet Take 1-2 tablets (10-20 mg total) by mouth daily. (Patient taking differently: Take 10-20 mg by mouth  as needed. )  . traZODone (DESYREL) 100 MG tablet 1 po at hs  . vitamin B-12 (CYANOCOBALAMIN) 1000 MCG tablet Take 1,000 mcg by mouth daily.      Allergies:   Cefepime, Cephaeline, Morphine, Morphine and related, Penicillins,  Doxycycline, and Oxycodone-acetaminophen   Social History   Tobacco Use  . Smoking status: Never Smoker  . Smokeless tobacco: Never Used  Substance Use Topics  . Alcohol use: No    Alcohol/week: 0.0 standard drinks    Comment: "I do not drink anymore"  . Drug use: No     Family Hx: The patient's family history includes Cancer in his maternal grandmother; Cancer - Lung in his mother; Cancer - Prostate (age of onset: 43) in his father; Colon polyps in his father; Coronary artery disease in his father and maternal aunt; Heart attack in his brother and maternal aunt; Hemochromatosis in his sister; Hyperlipidemia in his brother; Hypertension in his father; Lung cancer in his mother; Macular degeneration in his mother; Melanoma in his mother; Other in his brother and mother; Prostate cancer in his father; Rheum arthritis in his sister; Thyroid disease in his sister; Uterine cancer in his mother. There is no history of Diabetes, Colon cancer, Esophageal cancer, Rectal cancer, or Stomach cancer.  ROS:   Please see the history of present illness.     All other systems reviewed and are negative.   Prior CV studies:   The following studies were reviewed today: Cath 06/17/14 LHC 06/17/14 Coronary angiography: Coronary dominance: right Left mainstem: Normal Left anterior descending (LAD): 80% proximal lesion before D1 50% just distal to D1 80% distal D1 30% multiple discrete Left circumflex (LCx): Primarily AV groove and OM1 Widely patent stents in OM Right coronary artery (RCA): Dominant 30% proximal 50% Mid vessel stenosis  Left ventriculography: Left ventricular systolic function is normal, LVEF is estimated at 55-65%, there is no significant mitral  regurgitation  Final Conclusions: New LAD lesions patent stents in OM Recommendations: Reviewed with Dr Burt Knack PCI/Stent mulitple sights in LAD  Conclusions: Successful PCI of diffuse severe stenosis in the proximal to mid LAD using overlapping drug-eluting stent platforms  Recommendations: Dual antiplatelet therapy with aspirin and Plavix for at least 12 months.   Lesion Data: Vessel: LAD/proximal to mid Percent stenosis (pre): 80 TIMI-flow (pre): 3 Stent: 2.5 x 24 mm Promus DES and 3.0 x 20 mm Promus DES (overlapping) Percent stenosis (post): 0 TIMI-flow (post): 3   Myovue 2016   Labs/Other Tests and Data Reviewed:    EKG:   03/23/18 SR rate 65 old IMI no acute changes   Recent Labs: 08/19/2018: ALT 12; BUN 18; Creatinine 0.91; Hemoglobin 12.4; Platelet Count 201; Potassium 4.1; Sodium 134   Recent Lipid Panel Lab Results  Component Value Date/Time   CHOL 128 03/10/2015 10:08 AM   TRIG 95.0 03/10/2015 10:08 AM   HDL 39.30 03/10/2015 10:08 AM   CHOLHDL 3 03/10/2015 10:08 AM   LDLCALC 70 03/10/2015 10:08 AM    Wt Readings from Last 3 Encounters:  09/28/18 62.1 kg  09/11/18 61.2 kg  08/28/18 61.2 kg     Objective:    Vital Signs:  BP 122/68   Pulse 67   Ht 5' 6.5" (1.689 m)   Wt 62.1 kg   BMI 21.78 kg/m    Skin warm and dry No distress No tachypnea No JVP elevation  Neuro appears non focal No edema    ASSESSMENT & PLAN:    1. CAD s/p overlapping DESx2 to LAD on 06/17/2014 Previous stenting circumflex 2006 with spiral dissection : no angina, Continue plavix Since not taking ASA P2Y has been good in past   2. COPD: stable no active wheezing   3. Paroxysmal atrial fibrillation not on  systemic anticoagulation given need for DAPT  s/p TEE DCCV in May 2012, previously on pradaxa, off pradaxa now given need for Plavix  4. Hyperlipidemia: LFT and lipid panel stable on 03/10/2015. Continue pravachol  5. OSA:  not able to use CPAP at night  discussed going to dentist for oral device   6. Rheumatoid arthritis continue pain meds Inactivity has not helped    7. Melanoma:  F/u oncology getting adjuvant Rx stage 3C PET scan  07/14/18 was stable  8. GI:  F/u Ardis Hughs recent colonoscopy with multiple polyp removal and diarrhea due to chemoRx  COVID-19 Education: The signs and symptoms of COVID-19 were discussed with the patient and how to seek care for testing (follow up with PCP or arrange E-visit).  The importance of social distancing was discussed today.  Time:   Today, I have spent 30 minutes with the patient with telehealth technology discussing the above problems.     Medication Adjustments/Labs and Tests Ordered: Current medicines are reviewed at length with the patient today.  Concerns regarding medicines are outlined above.   Tests Ordered:  None   Medication Changes:  None   Disposition:  Follow up in 6 months  Signed, Jenkins Rouge, MD  09/28/2018 8:23 AM    [facility]

## 2018-09-28 ENCOUNTER — Telehealth: Payer: Self-pay | Admitting: Gastroenterology

## 2018-09-28 ENCOUNTER — Telehealth (INDEPENDENT_AMBULATORY_CARE_PROVIDER_SITE_OTHER): Payer: Medicare Other | Admitting: Cardiovascular Disease

## 2018-09-28 ENCOUNTER — Encounter: Payer: Self-pay | Admitting: Cardiovascular Disease

## 2018-09-28 ENCOUNTER — Other Ambulatory Visit: Payer: Self-pay

## 2018-09-28 VITALS — BP 122/68 | HR 67 | Ht 66.5 in | Wt 137.0 lb

## 2018-09-28 DIAGNOSIS — I2510 Atherosclerotic heart disease of native coronary artery without angina pectoris: Secondary | ICD-10-CM | POA: Diagnosis not present

## 2018-09-28 NOTE — Patient Instructions (Addendum)
Medication Instructions:   If you need a refill on your cardiac medications before your next appointment, please call your pharmacy.   Lab work:  If you have labs (blood work) drawn today and your tests are completely normal, you will receive your results only by: [name] MyChart Message (if you have MyChart) OR . A paper copy in the mail If you have any lab test that is abnormal or we need to change your treatment, we will call you to review the results.  Testing/Procedures: None ordered at this time.  Follow-Up: At [facility], you and your health needs are our priority.  As part of our continuing mission to provide you with exceptional heart care, we have created designated Provider Care Teams.  These Care Teams include your primary Cardiologist (physician) and Advanced Practice Providers (APPs -  Physician Assistants and Nurse Practitioners) who all work together to provide you with the care you need, when you need it. You will need a follow up appointment in 6 months.  Please call our office 2 months in advance to schedule this appointment.  You may see Jenkins Rouge, MD or one of the following Advanced Practice Providers on your designated Care Team:   Truitt Merle, NP Cecilie Kicks, NP . Kathyrn Drown, NP

## 2018-09-28 NOTE — Telephone Encounter (Signed)
September 21, 2018   [name] [address] [place] [address]   Dear [name],   Several polyps that I removed during your recent procedure were completely benign but were proven to be "pre-cancerous" polyps that MAY have grown into cancers if they had not been removed.  Studies shows that at least 20% of women over age 71 and 30% of men over age 38 have pre-cancerous polyps.  Based on current nationally recognized surveillance guidelines, I recommend that you have a repeat colonoscopy in 3 years.   If you develop any new rectal bleeding, abdominal pain or significant bowel habit changes, please contact me before then.    Sincerely,    Milus Banister, MD

## 2018-09-28 NOTE — Telephone Encounter (Signed)
I have called the pt and given him the information in the letter.  He thanked me for the call and will call back with any further questions.

## 2018-09-29 ENCOUNTER — Other Ambulatory Visit: Payer: Self-pay

## 2018-09-29 ENCOUNTER — Encounter: Payer: Self-pay | Admitting: Internal Medicine

## 2018-09-29 ENCOUNTER — Ambulatory Visit (INDEPENDENT_AMBULATORY_CARE_PROVIDER_SITE_OTHER): Payer: Medicare Other | Admitting: Internal Medicine

## 2018-09-29 DIAGNOSIS — K518 Other ulcerative 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: Secondary | ICD-10-CM | POA: Diagnosis not present

## 2018-09-29 DIAGNOSIS — R0600 Dyspnea, unspecified: Secondary | ICD-10-CM

## 2018-09-29 DIAGNOSIS — M05711 Rheumatoid arthritis with rheumatoid factor of right shoulder without organ or systems involvement: Secondary | ICD-10-CM

## 2018-09-29 DIAGNOSIS — F419 Anxiety disorder, unspecified: Secondary | ICD-10-CM

## 2018-09-29 DIAGNOSIS — R0609 Other forms of dyspnea: Secondary | ICD-10-CM | POA: Diagnosis not present

## 2018-09-29 DIAGNOSIS — I2510 Atherosclerotic heart disease of native coronary artery without angina pectoris: Secondary | ICD-10-CM | POA: Diagnosis not present

## 2018-09-29 DIAGNOSIS — I10 Essential (primary) hypertension: Secondary | ICD-10-CM

## 2018-09-29 DIAGNOSIS — G894 Chronic pain syndrome: Secondary | ICD-10-CM | POA: Diagnosis not present

## 2018-09-29 MED ORDER — HYDROMORPHONE HCL 2 MG PO TABS: 2.0000 mg | ORAL_TABLET | Freq: Three times a day (TID) | ORAL | 0 refills | Status: DC | PRN

## 2018-09-29 NOTE — Assessment & Plan Note (Signed)
Lopressor and Losartan HCT

## 2018-09-29 NOTE — Assessment & Plan Note (Signed)
Chronic diarrhea

## 2018-09-29 NOTE — Assessment & Plan Note (Signed)
Better on Dilaudid Chronic pain  Potential benefits of a long term opioids use as well as potential risks (i.e. addiction risk, apnea etc) and complications (i.e. Somnolence, constipation and others) were explained to the patient and were aknowledged. ?trekking poles for walking

## 2018-09-29 NOTE — Assessment & Plan Note (Signed)
Xanax prn  Potential benefits of a long term benzodiazepines  use as well as potential risks  and complications were explained to the patient and were aknowledged. Not to take w/dilaudid

## 2018-09-29 NOTE — Assessment & Plan Note (Signed)
Re-start exercises

## 2018-09-29 NOTE — Progress Notes (Signed)
Subjective:  Patient ID: [name], male    DOB: 16-Apr-1945  Age: 73 y.o. MRN: [id number]  CC: No chief complaint on file.   HPI [name] presents for RA, chronic pain, BPH, anxiety f/u  Outpatient Medications Prior to Visit  Medication Sig Dispense Refill   albuterol (PROVENTIL HFA;VENTOLIN HFA) 108 (90 Base) MCG/ACT inhaler Inhale 1-2 puffs into the lungs every 4 (four) hours as needed for wheezing or shortness of breath. 1 Inhaler 1   alfuzosin (UROXATRAL) 10 MG 24 hr tablet Take 10 mg by mouth at bedtime.   0   ALPRAZolam (XANAX) 0.5 MG tablet TAKE 1 TABLET BY MOUTH TWICE DAILY AS NEEDED FOR ANXIETY 60 tablet 3   Cholecalciferol (VITAMIN D3) 2000 UNITS capsule Take 1 capsule (2,000 Units total) by mouth daily. 100 capsule 3   clopidogrel (PLAVIX) 75 MG tablet TAKE 1 TABLET BY MOUTH EVERY DAY WITH BREAKFAST 90 tablet 3   dronabinol (MARINOL) 5 MG capsule Take 1 capsule (5 mg total) by mouth 2 (two) times daily before a meal. 60 capsule 2   FLUoxetine (PROZAC) 40 MG capsule TAKE 1 CAPSULE BY MOUTH TWICE DAILY 180 capsule 3   fluticasone (FLONASE) 50 MCG/ACT nasal spray INSTILL 2 SPRAYS IN EACH NOSTRIL EVERY DAY 16 g 11   gabapentin (NEURONTIN) 300 MG capsule Take 1 capsule (300 mg total) by mouth 2 (two) times daily. 180 capsule 1   HYDROmorphone (DILAUDID) 2 MG tablet Take 1 tablet (2 mg total) by mouth every 8 (eight) hours as needed for severe pain. 90 tablet 0   lidocaine-prilocaine (EMLA) cream Apply 1 application topically as needed. 30 g 5   losartan-hydrochlorothiazide (HYZAAR) 50-12.5 MG tablet Take 1 tablet by mouth daily. 90 tablet 3   Melatonin 5 MG TABS Take 5 mg by mouth at bedtime. Takes 10 mg at HS     metoprolol tartrate (LOPRESSOR) 25 MG tablet Take 0.5 tablets (12.5 mg total) by mouth 2 (two) times daily. 180 tablet 1   Omega-3 Fatty Acids (FISH OIL OMEGA-3) 1000 MG CAPS Take 1,000 mg by mouth daily.      ondansetron (ZOFRAN) 8 MG  tablet Take 1 tablet (8 mg total) by mouth 3 (three) times daily. 90 tablet 3   pantoprazole (PROTONIX) 40 MG tablet Take 1 tablet (40 mg total) by mouth every evening. 90 tablet 1   pravastatin (PRAVACHOL) 20 MG tablet Take 1 tablet (20 mg total) by mouth daily. (Patient taking differently: Take 20 mg by mouth every evening. ) 90 tablet 1   predniSONE (DELTASONE) 5 MG tablet Take 2 tablets (10 mg total) by mouth daily with breakfast. 90 tablet 2   prochlorperazine (COMPAZINE) 10 MG tablet TAKE 1 TABLET(10 MG) BY MOUTH EVERY 6 HOURS AS NEEDED FOR NAUSEA OR VOMITING 385 tablet 1   Pyridoxine HCl (B-6 PO) Take 1 tablet by mouth.     tadalafil (CIALIS) 5 MG tablet Take 5 mg by mouth as needed.     torsemide (DEMADEX) 10 MG tablet Take 1-2 tablets (10-20 mg total) by mouth daily. (Patient taking differently: Take 10-20 mg by mouth as needed. ) 60 tablet 3   traZODone (DESYREL) 100 MG tablet 1 po at hs 90 tablet 3   vitamin B-12 (CYANOCOBALAMIN) 1000 MCG tablet Take 1,000 mcg by mouth daily.      No facility-administered medications prior to visit.     ROS: Review of Systems  Constitutional: Positive for fatigue. Negative for appetite change  and unexpected weight change.  HENT: Negative for congestion, nosebleeds, sneezing, sore throat and trouble swallowing.   Eyes: Negative for itching and visual disturbance.  Respiratory: Negative for cough.   Cardiovascular: Negative for chest pain, palpitations and leg swelling.  Gastrointestinal: Negative for abdominal distention, blood in stool, diarrhea and nausea.  Genitourinary: Negative for frequency and hematuria.  Musculoskeletal: Positive for arthralgias, back pain and gait problem. Negative for joint swelling and neck pain.  Skin: Negative for rash.  Neurological: Positive for weakness. Negative for dizziness, tremors and speech difficulty.  Psychiatric/Behavioral: Positive for decreased concentration. Negative for agitation, dysphoric  mood, sleep disturbance and suicidal ideas. The patient is not nervous/anxious.     Objective:  BP 114/68 (BP Location: Left Arm, Patient Position: Sitting, Cuff Size: Normal)    Pulse 64    Temp 98 F (36.7 C) (Oral)    Ht 5' 6.5" (1.689 m)    Wt 136 lb (61.7 kg)    SpO2 95%    BMI 21.62 kg/m   BP Readings from Last 3 Encounters:  09/29/18 114/68  09/28/18 122/68  09/11/18 116/61    Wt Readings from Last 3 Encounters:  09/29/18 136 lb (61.7 kg)  09/28/18 137 lb (62.1 kg)  09/11/18 135 lb (61.2 kg)    Physical Exam Constitutional:      General: He is not in acute distress.    Appearance: He is well-developed.     Comments: NAD  Eyes:     Conjunctiva/sclera: Conjunctivae normal.     Pupils: Pupils are equal, round, and reactive to light.  Neck:     Musculoskeletal: Normal range of motion.     Thyroid: No thyromegaly.     Vascular: No JVD.  Cardiovascular:     Rate and Rhythm: Normal rate and regular rhythm.     Heart sounds: Normal heart sounds. No murmur. No friction rub. No gallop.   Pulmonary:     Effort: Pulmonary effort is normal. No respiratory distress.     Breath sounds: Normal breath sounds. No wheezing or rales.  Chest:     Chest wall: No tenderness.  Abdominal:     General: Bowel sounds are normal. There is no distension.     Palpations: Abdomen is soft. There is no mass.     Tenderness: There is no abdominal tenderness. There is no guarding or rebound.  Musculoskeletal: Normal range of motion.        General: No tenderness.  Lymphadenopathy:     Cervical: No cervical adenopathy.  Skin:    General: Skin is warm and dry.     Findings: Bruising present. No rash.  Neurological:     Mental Status: He is alert and oriented to person, place, and time.     Cranial Nerves: No cranial nerve deficit.     Motor: Weakness present. No abnormal muscle tone.     Coordination: Coordination abnormal.     Gait: Gait abnormal.     Deep Tendon Reflexes: Reflexes are  normal and symmetric.  Psychiatric:        Behavior: Behavior normal.        Thought Content: Thought content normal.        Judgment: Judgment normal.   RA deformities   Lab Results  Component Value Date   WBC 5.3 08/19/2018   HGB 12.4 (L) 08/19/2018   HCT 37.7 (L) 08/19/2018   PLT 201 08/19/2018   GLUCOSE 127 (H) 08/19/2018   CHOL 128 03/10/2015  TRIG 95.0 03/10/2015   HDL 39.30 03/10/2015   LDLCALC 70 03/10/2015   ALT 12 08/19/2018   AST 11 (L) 08/19/2018   NA 134 (L) 08/19/2018   K 4.1 08/19/2018   CL 98 08/19/2018   CREATININE 0.91 08/19/2018   BUN 18 08/19/2018   CO2 30 08/19/2018   TSH 0.680 09/18/2017   PSA 1.95 10/15/2010   INR 1.1 (H) 06/14/2014   HGBA1C 6.0 09/28/2015    Nm Pet Image Restage (ps) Whole Body  Result Date: 07/14/2018 CLINICAL DATA:  Subsequent treatment strategy for restaging of melanoma. EXAM: NUCLEAR MEDICINE PET WHOLE BODY TECHNIQUE: 6.8 mCi F-18 FDG was injected intravenously. Full-ring PET imaging was performed from the skull base to thigh after the radiotracer. CT data was obtained and used for attenuation correction and anatomic localization. Fasting blood glucose: 81 mg/dl COMPARISON:  04/03/2018.  Clinic note of 06/11/2018 FINDINGS: Mediastinal blood pool activity: SUV max 1.8 HEAD/NECK: No abnormal intracranial or cervical nodal hypermetabolism. Incidental CT findings: Bilateral carotid atherosclerosis. No cervical adenopathy. CHEST: Development of relatively mild bilateral, upper lobe predominant hypermetabolic ground-glass opacities. Example in the right upper lobe at on the order of 2.8 cm and a S.U.V. max of 6.3 on image 26/8. Within the anterior left upper lobe at a S.U.V. max of 4.4 on image 33/8. No thoracic nodal hypermetabolism. Incidental CT findings: Right hemidiaphragm elevation. Mild cardiomegaly with aortic and coronary artery atherosclerosis. Left Port-A-Cath tip at mid SVC. Pulmonary artery enlargement, outflow tract 4.0 cm. Beam  hardening artifact from bilateral shoulder arthroplasty. ABDOMEN/PELVIS: No abdominopelvic nodal or parenchymal hypermetabolism identified. Incidental CT findings: Beam hardening artifact from lumbar spine fixation. Grossly normal adrenal glands. Mild renal cortical thinning bilaterally. Low-density bilateral renal lesions are difficult to directly quantify secondary to beam hardening artifact from adjacent lumbar spine hardware. Likely cysts based on morphology. Grossly similar to on the prior. Abdominal aortic atherosclerosis. Mild prostatomegaly. Right larger than left hydroceles. SKELETON: No abnormal marrow activity. Incidental CT findings: none EXTREMITIES: No abnormal activity within the extremities. 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 Surgical change about both feet and the right ankle. IMPRESSION: 1. No findings of residual or recurrent hypermetabolic melanoma. 2. Development of bilateral hypermetabolic upper lobe predominant ground-glass opacities. Favor drug toxicity. Given absence of fever or shortness of breath on prior clinic note of 06/11/2018, atypical or viral infectious process is felt less likely. 3. Multifactorial degradation as detailed above. 4. Coronary artery atherosclerosis. Aortic Atherosclerosis (ICD10-I70.0). 5. Pulmonary artery enlargement suggests pulmonary arterial hypertension. These results will be called to the ordering clinician or representative by the Radiologist Assistant, and communication documented in the PACS or zVision Dashboard. Electronically Signed   By: Abigail Miyamoto M.D.   On: 07/14/2018 16:54    Assessment & Plan:   There are no diagnoses linked to this encounter.   No orders of the defined types were placed in this encounter.    Follow-up: No follow-ups on file.  [name], MD

## 2018-09-29 NOTE — Assessment & Plan Note (Signed)
Dilaudid prn  Potential benefits of a long term opioids use as well as potential risks (i.e. addiction risk, apnea etc) and complications (i.e. Somnolence, constipation and others) were explained to the patient and were aknowledged.

## 2018-10-19 ENCOUNTER — Telehealth: Payer: Self-pay | Admitting: Internal Medicine

## 2018-10-19 NOTE — Telephone Encounter (Signed)
[name] is calling and she needs verbal order for PT and OT evaluations. Pt is schedule for evaluations at 1 pm tomorrow 10/20/2018. [name] also fax a form

## 2018-10-20 DIAGNOSIS — M25572 Pain in left ankle and joints of left foot: Secondary | ICD-10-CM | POA: Diagnosis not present

## 2018-10-20 DIAGNOSIS — M6281 Muscle weakness (generalized): Secondary | ICD-10-CM | POA: Diagnosis not present

## 2018-10-20 DIAGNOSIS — M069 Rheumatoid arthritis, unspecified: Secondary | ICD-10-CM | POA: Diagnosis not present

## 2018-10-20 DIAGNOSIS — R2681 Unsteadiness on feet: Secondary | ICD-10-CM | POA: Diagnosis not present

## 2018-10-20 NOTE — Telephone Encounter (Signed)
Verbals given, [employer]

## 2018-10-21 ENCOUNTER — Inpatient Hospital Stay (HOSPITAL_BASED_OUTPATIENT_CLINIC_OR_DEPARTMENT_OTHER): Payer: Medicare Other | Admitting: Family

## 2018-10-21 ENCOUNTER — Encounter: Payer: Self-pay | Admitting: Family

## 2018-10-21 ENCOUNTER — Telehealth: Payer: Self-pay | Admitting: Family

## 2018-10-21 ENCOUNTER — Other Ambulatory Visit: Payer: Self-pay

## 2018-10-21 ENCOUNTER — Inpatient Hospital Stay: Payer: Medicare Other | Attending: Hematology & Oncology

## 2018-10-21 ENCOUNTER — Inpatient Hospital Stay: Payer: Medicare Other

## 2018-10-21 ENCOUNTER — Other Ambulatory Visit: Payer: Self-pay | Admitting: Internal Medicine

## 2018-10-21 DIAGNOSIS — R0609 Other forms of dyspnea: Secondary | ICD-10-CM

## 2018-10-21 DIAGNOSIS — M25572 Pain in left ankle and joints of left foot: Secondary | ICD-10-CM | POA: Diagnosis not present

## 2018-10-21 DIAGNOSIS — C4372 Malignant melanoma of left lower limb, including hip: Secondary | ICD-10-CM | POA: Insufficient documentation

## 2018-10-21 DIAGNOSIS — M6281 Muscle weakness (generalized): Secondary | ICD-10-CM | POA: Diagnosis not present

## 2018-10-21 DIAGNOSIS — R2681 Unsteadiness on feet: Secondary | ICD-10-CM | POA: Diagnosis not present

## 2018-10-21 DIAGNOSIS — M069 Rheumatoid arthritis, unspecified: Secondary | ICD-10-CM | POA: Diagnosis not present

## 2018-10-21 DIAGNOSIS — D500 Iron deficiency anemia secondary to blood loss (chronic): Secondary | ICD-10-CM

## 2018-10-21 DIAGNOSIS — Z95828 Presence of other vascular implants and grafts: Secondary | ICD-10-CM

## 2018-10-21 LAB — CBC WITH DIFFERENTIAL (CANCER CENTER ONLY)
Abs Immature Granulocytes: 0.05 10*3/uL (ref 0.00–0.07)
Basophils Absolute: 0 10*3/uL (ref 0.0–0.1)
Basophils Relative: 0 %
Eosinophils Absolute: 0 10*3/uL (ref 0.0–0.5)
Eosinophils Relative: 0 %
HCT: 40.6 % (ref 39.0–52.0)
Hemoglobin: 13.2 g/dL (ref 13.0–17.0)
Immature Granulocytes: 1 %
Lymphocytes Relative: 16 %
Lymphs Abs: 1.1 10*3/uL (ref 0.7–4.0)
MCH: 30.1 pg (ref 26.0–34.0)
MCHC: 32.5 g/dL (ref 30.0–36.0)
MCV: 92.7 fL (ref 80.0–100.0)
Monocytes Absolute: 0.5 10*3/uL (ref 0.1–1.0)
Monocytes Relative: 7 %
Neutro Abs: 5.1 10*3/uL (ref 1.7–7.7)
Neutrophils Relative %: 76 %
Platelet Count: 201 10*3/uL (ref 150–400)
RBC: 4.38 MIL/uL (ref 4.22–5.81)
RDW: 13 % (ref 11.5–15.5)
WBC Count: 6.7 10*3/uL (ref 4.0–10.5)
nRBC: 0 % (ref 0.0–0.2)

## 2018-10-21 LAB — IRON AND TIBC
Iron: 62 ug/dL (ref 42–163)
Saturation Ratios: 28 % (ref 20–55)
TIBC: 223 ug/dL (ref 202–409)
UIBC: 160 ug/dL (ref 117–376)

## 2018-10-21 LAB — CMP (CANCER CENTER ONLY)
ALT: 10 U/L (ref 0–44)
AST: 10 U/L — ABNORMAL LOW (ref 15–41)
Albumin: 3.8 g/dL (ref 3.5–5.0)
Alkaline Phosphatase: 63 U/L (ref 38–126)
Anion gap: 6 (ref 5–15)
BUN: 19 mg/dL (ref 8–23)
CO2: 30 mmol/L (ref 22–32)
Calcium: 8.2 mg/dL — ABNORMAL LOW (ref 8.9–10.3)
Chloride: 98 mmol/L (ref 98–111)
Creatinine: 0.89 mg/dL (ref 0.61–1.24)
GFR, Est AFR Am: >60 mL/min (ref >60)
GFR, Estimated: >60 mL/min (ref >60)
Glucose, Bld: 106 mg/dL — ABNORMAL HIGH (ref 70–99)
Potassium: 3.9 mmol/L (ref 3.5–5.1)
Sodium: 134 mmol/L — ABNORMAL LOW (ref 135–145)
Total Bilirubin: 0.7 mg/dL (ref 0.3–1.2)
Total Protein: 6.3 g/dL — ABNORMAL LOW (ref 6.5–8.1)

## 2018-10-21 LAB — LACTATE DEHYDROGENASE: LDH: 142 U/L (ref 98–192)

## 2018-10-21 LAB — FERRITIN: Ferritin: 79 ng/mL (ref 24–336)

## 2018-10-21 MED ORDER — SODIUM CHLORIDE 0.9% FLUSH
10.0000 mL | INTRAVENOUS | Status: DC | PRN
  Administered 2018-10-21: 10:00:00 10 mL via INTRAVENOUS
  Filled 2018-10-21: qty 10

## 2018-10-21 MED ORDER — HEPARIN SOD (PORK) LOCK FLUSH 100 UNIT/ML IV SOLN
500.0000 [IU] | Freq: Once | INTRAVENOUS | Status: AC
  Administered 2018-10-21: 500 [IU] via INTRAVENOUS
  Filled 2018-10-21: qty 5

## 2018-10-21 NOTE — Progress Notes (Signed)
Hematology and Oncology Follow Up Visit  [name] [id number] Apr 16, 1945 73 y.o. 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 He states that he is feeling much better. His energy seems to have improved and he has started exercising again in the last week.  He has not had diarrhea in 6 weeks. He states that he had a colonoscopy and this seems to have straightened him out. He did have multiple polyps removed.   He has had no fever, chills, n/v, cough, rash, dizziness, chest pain, palpitations, abdominal pain or changes in bladder habits.  His SOB with over exertion is unchanged. He will take a break to rest if needed.  No swelling, numbness or tingling in his extremities.  No episodes of bleeding, no bruising or petechiae.  His appetite is much better and he is staying hydrated. His weight is stable.   ECOG Performance Status: 1 - Symptomatic but completely ambulatory  Medications:  Allergies as of 10/21/2018      Reactions   Cefepime Hives, Shortness Of Breath   Cephaeline Hives   Morphine Swelling   A swollen stomach.   Morphine And Related Shortness Of Breath, Nausea And Vomiting, Swelling, Other (See Comments)   Agitation, tolerates dilaudid Other reaction(s): Other (See Comments) Agitation, tolerates dilaudid   Penicillins Hives, Shortness Of Breath, Rash   Has patient had a PCN reaction causing immediate rash, facial/tongue/throat swelling, SOB or lightheadedness with hypotension: Yes Has patient had a PCN reaction causing severe rash involving mucus membranes or skin necrosis: Yes  Has patient had a PCN reaction that required hospitalization No Has patient had a PCN reaction occurring within the last 10 years: No If all of the above answers are "NO", then may proceed with Cephalosporin use.   Doxycycline Rash   Oxycodone-acetaminophen Other (See Comments)   Patient doesn't remember what type of reaction.      Medication List       Accurate as of October 21, 2018 10:38 AM. If you have any questions, ask your nurse or doctor.        albuterol 108 (90 Base) MCG/ACT inhaler Commonly known as: VENTOLIN HFA Inhale 1-2 puffs into the lungs every 4 (four) hours as needed for wheezing or shortness of breath.   alfuzosin 10 MG 24 hr tablet Commonly known as: UROXATRAL Take 10 mg by mouth at bedtime.   ALPRAZolam 0.5 MG tablet Commonly known as: XANAX TAKE 1 TABLET BY MOUTH TWICE DAILY AS NEEDED FOR ANXIETY   B-6 PO Take 1 tablet by mouth.   Cialis 5 MG tablet Generic drug: tadalafil Take 5 mg by mouth as needed.   clopidogrel 75 MG tablet Commonly known as: PLAVIX TAKE 1 TABLET BY MOUTH EVERY DAY WITH BREAKFAST   dronabinol 5 MG capsule Commonly known as: MARINOL Take 1 capsule (5 mg total) by mouth 2 (two) times daily before a meal.   Fish Oil Omega-3 1000 MG Caps Take 1,000 mg by mouth daily.   FLUoxetine 40 MG capsule Commonly known as: PROZAC TAKE 1 CAPSULE BY MOUTH TWICE DAILY   fluticasone 50 MCG/ACT nasal spray Commonly known as: FLONASE INSTILL  2 SPRAYS IN EACH NOSTRIL EVERY DAY   gabapentin 300 MG capsule Commonly known as: NEURONTIN Take 1 capsule (300 mg total) by mouth 2 (two) times daily.   HYDROmorphone 2 MG tablet Commonly known as: DILAUDID Take 1 tablet (2 mg total) by mouth every 8 (eight) hours as needed for severe pain.   lidocaine-prilocaine cream Commonly known as: EMLA Apply 1 application topically as needed.   losartan-hydrochlorothiazide 50-12.5 MG tablet Commonly known as: HYZAAR Take 1 tablet by mouth daily.    Melatonin 5 MG Tabs Take 5 mg by mouth at bedtime. Takes 10 mg at HS   metoprolol tartrate 25 MG tablet Commonly known as: LOPRESSOR Take 0.5 tablets (12.5 mg total) by mouth 2 (two) times daily.   ondansetron 8 MG tablet Commonly known as: ZOFRAN Take 1 tablet (8 mg total) by mouth 3 (three) times daily.   pantoprazole 40 MG tablet Commonly known as: PROTONIX Take 1 tablet (40 mg total) by mouth every evening.   pravastatin 20 MG tablet Commonly known as: PRAVACHOL Take 1 tablet (20 mg total) by mouth daily. What changed: when to take this   predniSONE 5 MG tablet Commonly known as: DELTASONE Take 2 tablets (10 mg total) by mouth daily with breakfast.   prochlorperazine 10 MG tablet Commonly known as: COMPAZINE TAKE 1 TABLET(10 MG) BY MOUTH EVERY 6 HOURS AS NEEDED FOR NAUSEA OR VOMITING   torsemide 10 MG tablet Commonly known as: Demadex Take 1-2 tablets (10-20 mg total) by mouth daily. What changed:   when to take this  reasons to take this   traZODone 100 MG tablet Commonly known as: DESYREL 1 po at hs   vitamin B-12 1000 MCG tablet Commonly known as: CYANOCOBALAMIN Take 1,000 mcg by mouth daily.   Vitamin D3 50 MCG (2000 UT) capsule Take 1 capsule (2,000 Units total) by mouth daily.       Allergies:  Allergies  Allergen Reactions  . Cefepime Hives and Shortness Of Breath  . Cephaeline Hives  . Morphine Swelling    A swollen stomach.  . Morphine And Related Shortness Of Breath, Nausea And Vomiting, Swelling and Other (See Comments)    Agitation, tolerates dilaudid Other reaction(s): Other (See Comments) Agitation, tolerates dilaudid  . Penicillins Hives, Shortness Of Breath and Rash    Has patient had a PCN reaction causing immediate rash, facial/tongue/throat swelling, SOB or lightheadedness with hypotension: Yes Has patient had a PCN reaction causing severe rash involving mucus membranes or skin necrosis: Yes Has patient had a PCN reaction that  required hospitalization No Has patient had a PCN reaction occurring within the last 10 years: No If all of the above answers are "NO", then may proceed with Cephalosporin use.   [name] Doxycycline Rash  . Oxycodone-Acetaminophen Other (See Comments)    Patient doesn't remember what type of reaction.    Past Medical History, Surgical history, Social history, and Family History were reviewed and updated.  Review of Systems: All other 10 point review of systems is negative.   Physical Exam:  height is 5' 6"  (1.676 m) and weight is 136 lb (61.7 kg). His oral temperature is 97 F (36.1 C) (abnormal). His blood pressure is 132/61 and his pulse is 57 (abnormal). His respiration is 18 and oxygen saturation is 98%.   Wt Readings from Last 3 Encounters:  10/21/18 136 lb (61.7 kg)  09/29/18 136 lb (61.7 kg)  09/28/18 137 lb (62.1 kg)    Ocular: Sclerae unicteric, pupils  equal, round and reactive to light Ear-nose-throat: Oropharynx clear, dentition fair Lymphatic: No cervical or supraclavicular adenopathy Lungs no rales or rhonchi, good excursion bilaterally Heart regular rate and rhythm, no murmur appreciated Abd soft, nontender, positive bowel sounds, no liver or spleen tip palpated on exam, no fluid wave  MSK no focal spinal tenderness, no joint edema Neuro: non-focal, well-oriented, appropriate affect Breasts: Deferred   Lab Results  Component Value Date   WBC 6.7 10/21/2018   HGB 13.2 10/21/2018   HCT 40.6 10/21/2018   MCV 92.7 10/21/2018   PLT 201 10/21/2018   Lab Results  Component Value Date   FERRITIN 74 08/19/2018   IRON 67 08/19/2018   TIBC 181 (L) 08/19/2018   UIBC 115 (L) 08/19/2018   IRONPCTSAT 37 08/19/2018   Lab Results  Component Value Date   RETICCTPCT 2.1 06/11/2018   RBC 4.38 10/21/2018   No results found for: KPAFRELGTCHN, LAMBDASER, KAPLAMBRATIO No results found for: IGGSERUM, IGA, IGMSERUM No results found for: Odetta Pink, SPEI   Chemistry      Component Value Date/Time   NA 134 (L) 08/19/2018 1524   NA 140 04/10/2017 1046   NA 135 (L) 03/06/2017 1104   K 4.1 08/19/2018 1524   K 3.7 04/10/2017 1046   K 4.2 03/06/2017 1104   CL 98 08/19/2018 1524   CL 98 04/10/2017 1046   CO2 30 08/19/2018 1524   CO2 31 04/10/2017 1046   CO2 30 (H) 03/06/2017 1104   BUN 18 08/19/2018 1524   BUN 21 04/10/2017 1046   BUN 20.0 03/06/2017 1104   CREATININE 0.91 08/19/2018 1524   CREATININE 0.9 04/10/2017 1046   CREATININE 1.0 03/06/2017 1104      Component Value Date/Time   CALCIUM 9.0 08/19/2018 1524   CALCIUM 8.9 04/10/2017 1046   CALCIUM 9.4 03/06/2017 1104   ALKPHOS 56 08/19/2018 1524   ALKPHOS 65 04/10/2017 1046   ALKPHOS 78 03/06/2017 1104   AST 11 (L) 08/19/2018 1524   AST 20 03/06/2017 1104   ALT 12 08/19/2018 1524   ALT 21 04/10/2017 1046   ALT 20 03/06/2017 1104   BILITOT 0.6 08/19/2018 1524   BILITOT 0.47 03/06/2017 1104       Impression and Plan: [name] is a very pleasant 73 yo caucasian gentleman with history of hemochromatosis, homozygous for the C282Y mutation which has not been a problem for him. He also has history of stage IIIC nodular melanoma of the left thigh. He completed treatment with Opdivo in October 2019. He is doing quite well now and has no complaints.  Iron studies are pending.  We will plan to see him back in another 3 months.  He will contact our office with any questions or concerns. We can certainly see him sooner if needed.  Laverna Peace, NP 7/15/202010:38 AM

## 2018-10-21 NOTE — Telephone Encounter (Signed)
Called to sch Oct 2020 appts per 7/15 LOS

## 2018-10-22 DIAGNOSIS — M25572 Pain in left ankle and joints of left foot: Secondary | ICD-10-CM | POA: Diagnosis not present

## 2018-10-22 DIAGNOSIS — M069 Rheumatoid arthritis, unspecified: Secondary | ICD-10-CM | POA: Diagnosis not present

## 2018-10-22 DIAGNOSIS — M6281 Muscle weakness (generalized): Secondary | ICD-10-CM | POA: Diagnosis not present

## 2018-10-22 DIAGNOSIS — R2681 Unsteadiness on feet: Secondary | ICD-10-CM | POA: Diagnosis not present

## 2018-10-23 DIAGNOSIS — M6281 Muscle weakness (generalized): Secondary | ICD-10-CM | POA: Diagnosis not present

## 2018-10-23 DIAGNOSIS — M069 Rheumatoid arthritis, unspecified: Secondary | ICD-10-CM | POA: Diagnosis not present

## 2018-10-23 DIAGNOSIS — M25572 Pain in left ankle and joints of left foot: Secondary | ICD-10-CM | POA: Diagnosis not present

## 2018-10-23 DIAGNOSIS — R2681 Unsteadiness on feet: Secondary | ICD-10-CM | POA: Diagnosis not present

## 2018-10-26 DIAGNOSIS — M069 Rheumatoid arthritis, unspecified: Secondary | ICD-10-CM | POA: Diagnosis not present

## 2018-10-26 DIAGNOSIS — M6281 Muscle weakness (generalized): Secondary | ICD-10-CM | POA: Diagnosis not present

## 2018-10-26 DIAGNOSIS — M25572 Pain in left ankle and joints of left foot: Secondary | ICD-10-CM | POA: Diagnosis not present

## 2018-10-26 DIAGNOSIS — R2681 Unsteadiness on feet: Secondary | ICD-10-CM | POA: Diagnosis not present

## 2018-10-28 DIAGNOSIS — M069 Rheumatoid arthritis, unspecified: Secondary | ICD-10-CM | POA: Diagnosis not present

## 2018-10-28 DIAGNOSIS — M25572 Pain in left ankle and joints of left foot: Secondary | ICD-10-CM | POA: Diagnosis not present

## 2018-10-28 DIAGNOSIS — R2681 Unsteadiness on feet: Secondary | ICD-10-CM | POA: Diagnosis not present

## 2018-10-28 DIAGNOSIS — M6281 Muscle weakness (generalized): Secondary | ICD-10-CM | POA: Diagnosis not present

## 2018-11-02 DIAGNOSIS — M47812 Spondylosis without myelopathy or radiculopathy, cervical region: Secondary | ICD-10-CM | POA: Diagnosis not present

## 2018-11-03 DIAGNOSIS — M6281 Muscle weakness (generalized): Secondary | ICD-10-CM | POA: Diagnosis not present

## 2018-11-03 DIAGNOSIS — M25572 Pain in left ankle and joints of left foot: Secondary | ICD-10-CM | POA: Diagnosis not present

## 2018-11-03 DIAGNOSIS — M069 Rheumatoid arthritis, unspecified: Secondary | ICD-10-CM | POA: Diagnosis not present

## 2018-11-03 DIAGNOSIS — R2681 Unsteadiness on feet: Secondary | ICD-10-CM | POA: Diagnosis not present

## 2018-11-04 DIAGNOSIS — M069 Rheumatoid arthritis, unspecified: Secondary | ICD-10-CM | POA: Diagnosis not present

## 2018-11-04 DIAGNOSIS — M25572 Pain in left ankle and joints of left foot: Secondary | ICD-10-CM | POA: Diagnosis not present

## 2018-11-04 DIAGNOSIS — M6281 Muscle weakness (generalized): Secondary | ICD-10-CM | POA: Diagnosis not present

## 2018-11-04 DIAGNOSIS — R2681 Unsteadiness on feet: Secondary | ICD-10-CM | POA: Diagnosis not present

## 2018-11-05 DIAGNOSIS — R2681 Unsteadiness on feet: Secondary | ICD-10-CM | POA: Diagnosis not present

## 2018-11-05 DIAGNOSIS — M25572 Pain in left ankle and joints of left foot: Secondary | ICD-10-CM | POA: Diagnosis not present

## 2018-11-05 DIAGNOSIS — M6281 Muscle weakness (generalized): Secondary | ICD-10-CM | POA: Diagnosis not present

## 2018-11-05 DIAGNOSIS — M069 Rheumatoid arthritis, unspecified: Secondary | ICD-10-CM | POA: Diagnosis not present

## 2018-11-06 DIAGNOSIS — M6281 Muscle weakness (generalized): Secondary | ICD-10-CM | POA: Diagnosis not present

## 2018-11-06 DIAGNOSIS — M069 Rheumatoid arthritis, unspecified: Secondary | ICD-10-CM | POA: Diagnosis not present

## 2018-11-06 DIAGNOSIS — M25572 Pain in left ankle and joints of left foot: Secondary | ICD-10-CM | POA: Diagnosis not present

## 2018-11-06 DIAGNOSIS — R2681 Unsteadiness on feet: Secondary | ICD-10-CM | POA: Diagnosis not present

## 2018-11-09 DIAGNOSIS — M25572 Pain in left ankle and joints of left foot: Secondary | ICD-10-CM | POA: Diagnosis not present

## 2018-11-09 DIAGNOSIS — R2681 Unsteadiness on feet: Secondary | ICD-10-CM | POA: Diagnosis not present

## 2018-11-09 DIAGNOSIS — M069 Rheumatoid arthritis, unspecified: Secondary | ICD-10-CM | POA: Diagnosis not present

## 2018-11-09 DIAGNOSIS — M6281 Muscle weakness (generalized): Secondary | ICD-10-CM | POA: Diagnosis not present

## 2018-11-10 DIAGNOSIS — M069 Rheumatoid arthritis, unspecified: Secondary | ICD-10-CM | POA: Diagnosis not present

## 2018-11-10 DIAGNOSIS — R2681 Unsteadiness on feet: Secondary | ICD-10-CM | POA: Diagnosis not present

## 2018-11-10 DIAGNOSIS — M25572 Pain in left ankle and joints of left foot: Secondary | ICD-10-CM | POA: Diagnosis not present

## 2018-11-10 DIAGNOSIS — M6281 Muscle weakness (generalized): Secondary | ICD-10-CM | POA: Diagnosis not present

## 2018-11-12 DIAGNOSIS — M25572 Pain in left ankle and joints of left foot: Secondary | ICD-10-CM | POA: Diagnosis not present

## 2018-11-12 DIAGNOSIS — M6281 Muscle weakness (generalized): Secondary | ICD-10-CM | POA: Diagnosis not present

## 2018-11-12 DIAGNOSIS — R2681 Unsteadiness on feet: Secondary | ICD-10-CM | POA: Diagnosis not present

## 2018-11-12 DIAGNOSIS — M069 Rheumatoid arthritis, unspecified: Secondary | ICD-10-CM | POA: Diagnosis not present

## 2018-11-13 ENCOUNTER — Other Ambulatory Visit: Payer: Self-pay | Admitting: Internal Medicine

## 2018-11-13 ENCOUNTER — Telehealth: Payer: Self-pay | Admitting: Internal Medicine

## 2018-11-13 DIAGNOSIS — M6281 Muscle weakness (generalized): Secondary | ICD-10-CM | POA: Diagnosis not present

## 2018-11-13 DIAGNOSIS — R2681 Unsteadiness on feet: Secondary | ICD-10-CM | POA: Diagnosis not present

## 2018-11-13 DIAGNOSIS — M069 Rheumatoid arthritis, unspecified: Secondary | ICD-10-CM | POA: Diagnosis not present

## 2018-11-13 DIAGNOSIS — M25572 Pain in left ankle and joints of left foot: Secondary | ICD-10-CM | POA: Diagnosis not present

## 2018-11-13 NOTE — Telephone Encounter (Signed)
Pt informed medication was sent in

## 2018-11-13 NOTE — Telephone Encounter (Signed)
Pt is out out of meds

## 2018-11-16 DIAGNOSIS — R2681 Unsteadiness on feet: Secondary | ICD-10-CM | POA: Diagnosis not present

## 2018-11-16 DIAGNOSIS — M25572 Pain in left ankle and joints of left foot: Secondary | ICD-10-CM | POA: Diagnosis not present

## 2018-11-16 DIAGNOSIS — M6281 Muscle weakness (generalized): Secondary | ICD-10-CM | POA: Diagnosis not present

## 2018-11-16 DIAGNOSIS — M069 Rheumatoid arthritis, unspecified: Secondary | ICD-10-CM | POA: Diagnosis not present

## 2018-11-17 DIAGNOSIS — M069 Rheumatoid arthritis, unspecified: Secondary | ICD-10-CM | POA: Diagnosis not present

## 2018-11-17 DIAGNOSIS — M6281 Muscle weakness (generalized): Secondary | ICD-10-CM | POA: Diagnosis not present

## 2018-11-17 DIAGNOSIS — R2681 Unsteadiness on feet: Secondary | ICD-10-CM | POA: Diagnosis not present

## 2018-11-17 DIAGNOSIS — M25572 Pain in left ankle and joints of left foot: Secondary | ICD-10-CM | POA: Diagnosis not present

## 2018-11-19 DIAGNOSIS — M25572 Pain in left ankle and joints of left foot: Secondary | ICD-10-CM | POA: Diagnosis not present

## 2018-11-19 DIAGNOSIS — M069 Rheumatoid arthritis, unspecified: Secondary | ICD-10-CM | POA: Diagnosis not present

## 2018-11-19 DIAGNOSIS — M6281 Muscle weakness (generalized): Secondary | ICD-10-CM | POA: Diagnosis not present

## 2018-11-19 DIAGNOSIS — R2681 Unsteadiness on feet: Secondary | ICD-10-CM | POA: Diagnosis not present

## 2018-11-23 DIAGNOSIS — M069 Rheumatoid arthritis, unspecified: Secondary | ICD-10-CM | POA: Diagnosis not present

## 2018-11-23 DIAGNOSIS — M6281 Muscle weakness (generalized): Secondary | ICD-10-CM | POA: Diagnosis not present

## 2018-11-23 DIAGNOSIS — Z96652 Presence of left artificial knee joint: Secondary | ICD-10-CM | POA: Diagnosis not present

## 2018-11-23 DIAGNOSIS — M25572 Pain in left ankle and joints of left foot: Secondary | ICD-10-CM | POA: Diagnosis not present

## 2018-11-23 DIAGNOSIS — Z96651 Presence of right artificial knee joint: Secondary | ICD-10-CM | POA: Diagnosis not present

## 2018-11-23 DIAGNOSIS — R2681 Unsteadiness on feet: Secondary | ICD-10-CM | POA: Diagnosis not present

## 2018-11-24 DIAGNOSIS — R2681 Unsteadiness on feet: Secondary | ICD-10-CM | POA: Diagnosis not present

## 2018-11-24 DIAGNOSIS — M25572 Pain in left ankle and joints of left foot: Secondary | ICD-10-CM | POA: Diagnosis not present

## 2018-11-24 DIAGNOSIS — M069 Rheumatoid arthritis, unspecified: Secondary | ICD-10-CM | POA: Diagnosis not present

## 2018-11-24 DIAGNOSIS — M6281 Muscle weakness (generalized): Secondary | ICD-10-CM | POA: Diagnosis not present

## 2018-11-26 DIAGNOSIS — M069 Rheumatoid arthritis, unspecified: Secondary | ICD-10-CM | POA: Diagnosis not present

## 2018-11-26 DIAGNOSIS — R2681 Unsteadiness on feet: Secondary | ICD-10-CM | POA: Diagnosis not present

## 2018-11-26 DIAGNOSIS — M6281 Muscle weakness (generalized): Secondary | ICD-10-CM | POA: Diagnosis not present

## 2018-11-26 DIAGNOSIS — M25572 Pain in left ankle and joints of left foot: Secondary | ICD-10-CM | POA: Diagnosis not present

## 2018-11-27 DIAGNOSIS — M6281 Muscle weakness (generalized): Secondary | ICD-10-CM | POA: Diagnosis not present

## 2018-11-27 DIAGNOSIS — M25572 Pain in left ankle and joints of left foot: Secondary | ICD-10-CM | POA: Diagnosis not present

## 2018-11-27 DIAGNOSIS — R2681 Unsteadiness on feet: Secondary | ICD-10-CM | POA: Diagnosis not present

## 2018-11-27 DIAGNOSIS — M069 Rheumatoid arthritis, unspecified: Secondary | ICD-10-CM | POA: Diagnosis not present

## 2018-11-30 DIAGNOSIS — M069 Rheumatoid arthritis, unspecified: Secondary | ICD-10-CM | POA: Diagnosis not present

## 2018-11-30 DIAGNOSIS — R2681 Unsteadiness on feet: Secondary | ICD-10-CM | POA: Diagnosis not present

## 2018-11-30 DIAGNOSIS — M25572 Pain in left ankle and joints of left foot: Secondary | ICD-10-CM | POA: Diagnosis not present

## 2018-11-30 DIAGNOSIS — M6281 Muscle weakness (generalized): Secondary | ICD-10-CM | POA: Diagnosis not present

## 2018-12-01 DIAGNOSIS — M6281 Muscle weakness (generalized): Secondary | ICD-10-CM | POA: Diagnosis not present

## 2018-12-01 DIAGNOSIS — R2681 Unsteadiness on feet: Secondary | ICD-10-CM | POA: Diagnosis not present

## 2018-12-01 DIAGNOSIS — M069 Rheumatoid arthritis, unspecified: Secondary | ICD-10-CM | POA: Diagnosis not present

## 2018-12-01 DIAGNOSIS — M25572 Pain in left ankle and joints of left foot: Secondary | ICD-10-CM | POA: Diagnosis not present

## 2018-12-03 DIAGNOSIS — M25572 Pain in left ankle and joints of left foot: Secondary | ICD-10-CM | POA: Diagnosis not present

## 2018-12-03 DIAGNOSIS — M069 Rheumatoid arthritis, unspecified: Secondary | ICD-10-CM | POA: Diagnosis not present

## 2018-12-03 DIAGNOSIS — M6281 Muscle weakness (generalized): Secondary | ICD-10-CM | POA: Diagnosis not present

## 2018-12-03 DIAGNOSIS — R2681 Unsteadiness on feet: Secondary | ICD-10-CM | POA: Diagnosis not present

## 2018-12-04 DIAGNOSIS — M069 Rheumatoid arthritis, unspecified: Secondary | ICD-10-CM | POA: Diagnosis not present

## 2018-12-04 DIAGNOSIS — R2681 Unsteadiness on feet: Secondary | ICD-10-CM | POA: Diagnosis not present

## 2018-12-04 DIAGNOSIS — M25572 Pain in left ankle and joints of left foot: Secondary | ICD-10-CM | POA: Diagnosis not present

## 2018-12-04 DIAGNOSIS — M6281 Muscle weakness (generalized): Secondary | ICD-10-CM | POA: Diagnosis not present

## 2018-12-06 ENCOUNTER — Other Ambulatory Visit: Payer: Self-pay | Admitting: Internal Medicine

## 2018-12-07 DIAGNOSIS — M6281 Muscle weakness (generalized): Secondary | ICD-10-CM | POA: Diagnosis not present

## 2018-12-07 DIAGNOSIS — M25572 Pain in left ankle and joints of left foot: Secondary | ICD-10-CM | POA: Diagnosis not present

## 2018-12-07 DIAGNOSIS — M069 Rheumatoid arthritis, unspecified: Secondary | ICD-10-CM | POA: Diagnosis not present

## 2018-12-07 DIAGNOSIS — R2681 Unsteadiness on feet: Secondary | ICD-10-CM | POA: Diagnosis not present

## 2018-12-08 DIAGNOSIS — M069 Rheumatoid arthritis, unspecified: Secondary | ICD-10-CM | POA: Diagnosis not present

## 2018-12-08 DIAGNOSIS — M19072 Primary osteoarthritis, left ankle and foot: Secondary | ICD-10-CM | POA: Diagnosis not present

## 2018-12-08 DIAGNOSIS — M6281 Muscle weakness (generalized): Secondary | ICD-10-CM | POA: Diagnosis not present

## 2018-12-08 DIAGNOSIS — R2681 Unsteadiness on feet: Secondary | ICD-10-CM | POA: Diagnosis not present

## 2018-12-08 DIAGNOSIS — M25572 Pain in left ankle and joints of left foot: Secondary | ICD-10-CM | POA: Diagnosis not present

## 2018-12-10 DIAGNOSIS — M6281 Muscle weakness (generalized): Secondary | ICD-10-CM | POA: Diagnosis not present

## 2018-12-10 DIAGNOSIS — R2681 Unsteadiness on feet: Secondary | ICD-10-CM | POA: Diagnosis not present

## 2018-12-10 DIAGNOSIS — M19072 Primary osteoarthritis, left ankle and foot: Secondary | ICD-10-CM | POA: Diagnosis not present

## 2018-12-10 DIAGNOSIS — M25572 Pain in left ankle and joints of left foot: Secondary | ICD-10-CM | POA: Diagnosis not present

## 2018-12-10 DIAGNOSIS — M069 Rheumatoid arthritis, unspecified: Secondary | ICD-10-CM | POA: Diagnosis not present

## 2018-12-11 DIAGNOSIS — M069 Rheumatoid arthritis, unspecified: Secondary | ICD-10-CM | POA: Diagnosis not present

## 2018-12-11 DIAGNOSIS — M25572 Pain in left ankle and joints of left foot: Secondary | ICD-10-CM | POA: Diagnosis not present

## 2018-12-11 DIAGNOSIS — R2681 Unsteadiness on feet: Secondary | ICD-10-CM | POA: Diagnosis not present

## 2018-12-11 DIAGNOSIS — M6281 Muscle weakness (generalized): Secondary | ICD-10-CM | POA: Diagnosis not present

## 2018-12-11 DIAGNOSIS — M19072 Primary osteoarthritis, left ankle and foot: Secondary | ICD-10-CM | POA: Diagnosis not present

## 2018-12-15 DIAGNOSIS — M6281 Muscle weakness (generalized): Secondary | ICD-10-CM | POA: Diagnosis not present

## 2018-12-15 DIAGNOSIS — M0579 Rheumatoid arthritis with rheumatoid factor of multiple sites without organ or systems involvement: Secondary | ICD-10-CM | POA: Diagnosis not present

## 2018-12-15 DIAGNOSIS — M25572 Pain in left ankle and joints of left foot: Secondary | ICD-10-CM | POA: Diagnosis not present

## 2018-12-15 DIAGNOSIS — M19072 Primary osteoarthritis, left ankle and foot: Secondary | ICD-10-CM | POA: Diagnosis not present

## 2018-12-15 DIAGNOSIS — M47812 Spondylosis without myelopathy or radiculopathy, cervical region: Secondary | ICD-10-CM | POA: Diagnosis not present

## 2018-12-15 DIAGNOSIS — I10 Essential (primary) hypertension: Secondary | ICD-10-CM | POA: Diagnosis not present

## 2018-12-15 DIAGNOSIS — R2681 Unsteadiness on feet: Secondary | ICD-10-CM | POA: Diagnosis not present

## 2018-12-15 DIAGNOSIS — M069 Rheumatoid arthritis, unspecified: Secondary | ICD-10-CM | POA: Diagnosis not present

## 2018-12-17 DIAGNOSIS — M6281 Muscle weakness (generalized): Secondary | ICD-10-CM | POA: Diagnosis not present

## 2018-12-17 DIAGNOSIS — M19072 Primary osteoarthritis, left ankle and foot: Secondary | ICD-10-CM | POA: Diagnosis not present

## 2018-12-17 DIAGNOSIS — R2681 Unsteadiness on feet: Secondary | ICD-10-CM | POA: Diagnosis not present

## 2018-12-17 DIAGNOSIS — M069 Rheumatoid arthritis, unspecified: Secondary | ICD-10-CM | POA: Diagnosis not present

## 2018-12-17 DIAGNOSIS — M25572 Pain in left ankle and joints of left foot: Secondary | ICD-10-CM | POA: Diagnosis not present

## 2018-12-18 DIAGNOSIS — M6281 Muscle weakness (generalized): Secondary | ICD-10-CM | POA: Diagnosis not present

## 2018-12-18 DIAGNOSIS — M19072 Primary osteoarthritis, left ankle and foot: Secondary | ICD-10-CM | POA: Diagnosis not present

## 2018-12-18 DIAGNOSIS — M069 Rheumatoid arthritis, unspecified: Secondary | ICD-10-CM | POA: Diagnosis not present

## 2018-12-18 DIAGNOSIS — M25572 Pain in left ankle and joints of left foot: Secondary | ICD-10-CM | POA: Diagnosis not present

## 2018-12-18 DIAGNOSIS — R2681 Unsteadiness on feet: Secondary | ICD-10-CM | POA: Diagnosis not present

## 2018-12-21 ENCOUNTER — Encounter: Payer: Self-pay | Admitting: Internal Medicine

## 2018-12-21 ENCOUNTER — Ambulatory Visit (INDEPENDENT_AMBULATORY_CARE_PROVIDER_SITE_OTHER): Payer: Medicare Other | Admitting: Internal Medicine

## 2018-12-21 ENCOUNTER — Other Ambulatory Visit: Payer: Self-pay

## 2018-12-21 VITALS — BP 124/72 | HR 61 | Temp 97.9°F | Ht 66.0 in | Wt 143.0 lb

## 2018-12-21 DIAGNOSIS — I2510 Atherosclerotic heart disease of native coronary artery without angina pectoris: Secondary | ICD-10-CM | POA: Diagnosis not present

## 2018-12-21 DIAGNOSIS — C4372 Malignant melanoma of left lower limb, including hip: Secondary | ICD-10-CM | POA: Diagnosis not present

## 2018-12-21 DIAGNOSIS — M05711 Rheumatoid arthritis with rheumatoid factor of right shoulder without organ or systems involvement: Secondary | ICD-10-CM | POA: Diagnosis not present

## 2018-12-21 DIAGNOSIS — I10 Essential (primary) hypertension: Secondary | ICD-10-CM

## 2018-12-21 DIAGNOSIS — Z23 Encounter for immunization: Secondary | ICD-10-CM

## 2018-12-21 DIAGNOSIS — R2681 Unsteadiness on feet: Secondary | ICD-10-CM | POA: Diagnosis not present

## 2018-12-21 DIAGNOSIS — M25572 Pain in left ankle and joints of left foot: Secondary | ICD-10-CM | POA: Diagnosis not present

## 2018-12-21 DIAGNOSIS — M069 Rheumatoid arthritis, unspecified: Secondary | ICD-10-CM | POA: Diagnosis not present

## 2018-12-21 DIAGNOSIS — M19072 Primary osteoarthritis, left ankle and foot: Secondary | ICD-10-CM | POA: Diagnosis not present

## 2018-12-21 DIAGNOSIS — M6281 Muscle weakness (generalized): Secondary | ICD-10-CM | POA: Diagnosis not present

## 2018-12-21 MED ORDER — ALPRAZOLAM 0.5 MG PO TABS: 0.5000 mg | ORAL_TABLET | Freq: Two times a day (BID) | ORAL | 3 refills | Status: DC | PRN

## 2018-12-21 MED ORDER — HYDROMORPHONE HCL 2 MG PO TABS: 2.0000 mg | ORAL_TABLET | Freq: Three times a day (TID) | ORAL | 0 refills | Status: DC | PRN

## 2018-12-21 NOTE — Assessment & Plan Note (Signed)
Reduce Lopressor and Losartan HCT

## 2018-12-21 NOTE — Patient Instructions (Addendum)
  Sign up for [employer] ( via [employer] on your phone or your ipad). If you don't have a Art therapist card  - go to [employer] branch. They will set you up in 15 minutes. It is free. You can check out books to read and to listen, check out magazines and newspapers, movies etc.

## 2018-12-21 NOTE — Assessment & Plan Note (Signed)
Done w/chemo

## 2018-12-21 NOTE — Assessment & Plan Note (Signed)
No angina 

## 2018-12-21 NOTE — Assessment & Plan Note (Signed)
Dr Johnsie Cancel On-set May '12 - RVR: had in-hospital [facility] with conversion and long term holding of sinus rhythm

## 2018-12-21 NOTE — Assessment & Plan Note (Addendum)
On Prednisone Dilaudid for pain   Potential benefits of a long term opioids use as well as potential risks (i.e. addiction risk, apnea etc) and complications (i.e. Somnolence, constipation and others) were explained to the patient and were aknowledged.

## 2018-12-21 NOTE — Progress Notes (Signed)
Subjective:  Patient ID: [name], male    DOB: [birth year]  Age: 73 y.o. MRN: [id number]  CC: No chief complaint on file.   HPI [name] presents for chronic pain - PT helps w/pain, muscle strength... F/u anxiety, BPH f/u  Outpatient Medications Prior to Visit  Medication Sig Dispense Refill   albuterol (PROVENTIL HFA;VENTOLIN HFA) 108 (90 Base) MCG/ACT inhaler Inhale 1-2 puffs into the lungs every 4 (four) hours as needed for wheezing or shortness of breath. 1 Inhaler 1   alfuzosin (UROXATRAL) 10 MG 24 hr tablet Take 10 mg by mouth at bedtime.   0   ALPRAZolam (XANAX) 0.5 MG tablet TAKE 1 TABLET BY MOUTH TWICE DAILY AS NEEDED FOR ANXIETY 60 tablet 3   Cholecalciferol (VITAMIN D3) 2000 UNITS capsule Take 1 capsule (2,000 Units total) by mouth daily. 100 capsule 3   clopidogrel (PLAVIX) 75 MG tablet TAKE 1 TABLET BY MOUTH EVERY DAY WITH BREAKFAST 90 tablet 3   dronabinol (MARINOL) 5 MG capsule Take 1 capsule (5 mg total) by mouth 2 (two) times daily before a meal. 60 capsule 2   FLUoxetine (PROZAC) 40 MG capsule TAKE 1 CAPSULE BY MOUTH TWICE DAILY 180 capsule 3   fluticasone (FLONASE) 50 MCG/ACT nasal spray INSTILL 2 SPRAYS IN EACH NOSTRIL EVERY DAY 16 g 11   gabapentin (NEURONTIN) 300 MG capsule TAKE 1 CAPSULE(300 MG) BY MOUTH TWICE DAILY 180 capsule 1   HYDROmorphone (DILAUDID) 2 MG tablet Take 1 tablet (2 mg total) by mouth every 8 (eight) hours as needed for severe pain. 90 tablet 0   lidocaine-prilocaine (EMLA) cream Apply 1 application topically as needed. 30 g 5   losartan-hydrochlorothiazide (HYZAAR) 50-12.5 MG tablet TAKE 1 TABLET BY MOUTH DAILY 90 tablet 3   Melatonin 5 MG TABS Take 5 mg by mouth at bedtime. Takes 10 mg at HS     metoprolol tartrate (LOPRESSOR) 25 MG tablet TAKE 1 TABLET (25 MG TOTAL) BY MOUTH 2 (TWO) TIMES DAILY. 180 tablet 1   Omega-3 Fatty Acids (FISH OIL OMEGA-3) 1000 MG CAPS Take 1,000 mg by mouth daily.      ondansetron  (ZOFRAN) 8 MG tablet Take 1 tablet (8 mg total) by mouth 3 (three) times daily. 90 tablet 3   pantoprazole (PROTONIX) 40 MG tablet TAKE 1 TABLET (40 MG TOTAL) BY MOUTH EVERY EVENING. 90 tablet 1   pravastatin (PRAVACHOL) 20 MG tablet Take 1 tablet (20 mg total) by mouth daily. (Patient taking differently: Take 20 mg by mouth every evening. ) 90 tablet 1   predniSONE (DELTASONE) 5 MG tablet TAKE 2 TABLETS (10 MG TOTAL) BY MOUTH DAILY WITH BREAKFAST. 180 tablet 1   prochlorperazine (COMPAZINE) 10 MG tablet TAKE 1 TABLET(10 MG) BY MOUTH EVERY 6 HOURS AS NEEDED FOR NAUSEA OR VOMITING 385 tablet 1   Pyridoxine HCl (B-6 PO) Take 1 tablet by mouth.     tadalafil (CIALIS) 5 MG tablet Take 5 mg by mouth as needed.     torsemide (DEMADEX) 10 MG tablet Take 1-2 tablets (10-20 mg total) by mouth daily. (Patient taking differently: Take 10-20 mg by mouth as needed. ) 60 tablet 3   traZODone (DESYREL) 100 MG tablet 1 po at hs 90 tablet 3   vitamin B-12 (CYANOCOBALAMIN) 1000 MCG tablet Take 1,000 mcg by mouth daily.      No facility-administered medications prior to visit.     ROS: Review of Systems  Constitutional: Negative for appetite change, fatigue  and unexpected weight change.  HENT: Negative for congestion, nosebleeds, sneezing, sore throat and trouble swallowing.   Eyes: Negative for itching and visual disturbance.  Respiratory: Negative for cough.   Cardiovascular: Negative for chest pain, palpitations and leg swelling.  Gastrointestinal: Negative for abdominal distention, blood in stool, diarrhea and nausea.  Genitourinary: Negative for frequency and hematuria.  Musculoskeletal: Positive for arthralgias, back pain and gait problem. Negative for joint swelling and neck pain.  Skin: Negative for rash.  Neurological: Positive for weakness. Negative for dizziness, tremors and speech difficulty.  Psychiatric/Behavioral: Negative for agitation, dysphoric mood, sleep disturbance and suicidal  ideas. The patient is not nervous/anxious.     Objective:  BP 124/72 (BP Location: Left Arm, Patient Position: Sitting, Cuff Size: Normal)    Pulse 61    Temp 97.9 F (36.6 C) (Oral)    Ht 5' 6"  (1.676 m)    Wt 143 lb (64.9 kg)    SpO2 95%    BMI 23.08 kg/m   BP Readings from Last 3 Encounters:  12/21/18 124/72  10/21/18 132/61  09/29/18 114/68    Wt Readings from Last 3 Encounters:  12/21/18 143 lb (64.9 kg)  10/21/18 136 lb (61.7 kg)  09/29/18 136 lb (61.7 kg)    Physical Exam Constitutional:      General: He is not in acute distress.    Appearance: He is well-developed.     Comments: NAD  Eyes:     Conjunctiva/sclera: Conjunctivae normal.     Pupils: Pupils are equal, round, and reactive to light.  Neck:     Musculoskeletal: Normal range of motion.     Thyroid: No thyromegaly.     Vascular: No JVD.  Cardiovascular:     Rate and Rhythm: Normal rate and regular rhythm.     Heart sounds: Normal heart sounds. No murmur. No friction rub. No gallop.   Pulmonary:     Effort: Pulmonary effort is normal. No respiratory distress.     Breath sounds: Normal breath sounds. No wheezing or rales.  Chest:     Chest wall: No tenderness.  Abdominal:     General: Bowel sounds are normal. There is no distension.     Palpations: Abdomen is soft. There is no mass.     Tenderness: There is no abdominal tenderness. There is no guarding or rebound.  Musculoskeletal: Normal range of motion.        General: Tenderness and deformity present.  Lymphadenopathy:     Cervical: No cervical adenopathy.  Skin:    General: Skin is warm and dry.     Findings: No rash.  Neurological:     Mental Status: He is alert and oriented to person, place, and time.     Cranial Nerves: No cranial nerve deficit.     Motor: Weakness present. No abnormal muscle tone.     Coordination: Coordination abnormal.     Gait: Gait abnormal.     Deep Tendon Reflexes: Reflexes are normal and symmetric.  Psychiatric:         Behavior: Behavior normal.        Thought Content: Thought content normal.        Judgment: Judgment normal.     Lab Results  Component Value Date   WBC 6.7 10/21/2018   HGB 13.2 10/21/2018   HCT 40.6 10/21/2018   PLT 201 10/21/2018   GLUCOSE 106 (H) 10/21/2018   CHOL 128 03/10/2015   TRIG 95.0 03/10/2015   HDL 39.30  03/10/2015   LDLCALC 70 03/10/2015   ALT 10 10/21/2018   AST 10 (L) 10/21/2018   NA 134 (L) 10/21/2018   K 3.9 10/21/2018   CL 98 10/21/2018   CREATININE 0.89 10/21/2018   BUN 19 10/21/2018   CO2 30 10/21/2018   TSH 0.680 09/18/2017   PSA 1.95 10/15/2010   INR 1.1 (H) 06/14/2014   HGBA1C 6.0 09/28/2015    Nm Pet Image Restage (ps) Whole Body  Result Date: 07/14/2018 CLINICAL DATA:  Subsequent treatment strategy for restaging of melanoma. EXAM: NUCLEAR MEDICINE PET WHOLE BODY TECHNIQUE: 6.8 mCi F-18 FDG was injected intravenously. Full-ring PET imaging was performed from the skull base to thigh after the radiotracer. CT data was obtained and used for attenuation correction and anatomic localization. Fasting blood glucose: 81 mg/dl COMPARISON:  04/03/2018.  Clinic note of 06/11/2018 FINDINGS: Mediastinal blood pool activity: SUV max 1.8 HEAD/NECK: No abnormal intracranial or cervical nodal hypermetabolism. Incidental CT findings: Bilateral carotid atherosclerosis. No cervical adenopathy. CHEST: Development of relatively mild bilateral, upper lobe predominant hypermetabolic ground-glass opacities. Example in the right upper lobe at on the order of 2.8 cm and a S.U.V. max of 6.3 on image 26/8. Within the anterior left upper lobe at a S.U.V. max of 4.4 on image 33/8. No thoracic nodal hypermetabolism. Incidental CT findings: Right hemidiaphragm elevation. Mild cardiomegaly with aortic and coronary artery atherosclerosis. Left Port-A-Cath tip at mid SVC. Pulmonary artery enlargement, outflow tract 4.0 cm. Beam hardening artifact from bilateral shoulder arthroplasty.  ABDOMEN/PELVIS: No abdominopelvic nodal or parenchymal hypermetabolism identified. Incidental CT findings: Beam hardening artifact from lumbar spine fixation. Grossly normal adrenal glands. Mild renal cortical thinning bilaterally. Low-density bilateral renal lesions are difficult to directly quantify secondary to beam hardening artifact from adjacent lumbar spine hardware. Likely cysts based on morphology. Grossly similar to on the prior. Abdominal aortic atherosclerosis. Mild prostatomegaly. Right larger than left hydroceles. SKELETON: No abnormal marrow activity. Incidental CT findings: none EXTREMITIES: No abnormal activity within the extremities. 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 Surgical change about both feet and the right ankle. IMPRESSION: 1. No findings of residual or recurrent hypermetabolic melanoma. 2. Development of bilateral hypermetabolic upper lobe predominant ground-glass opacities. Favor drug toxicity. Given absence of fever or shortness of breath on prior clinic note of 06/11/2018, atypical or viral infectious process is felt less likely. 3. Multifactorial degradation as detailed above. 4. Coronary artery atherosclerosis. Aortic Atherosclerosis (ICD10-I70.0). 5. Pulmonary artery enlargement suggests pulmonary arterial hypertension. These results will be called to the ordering clinician or representative by the Radiologist Assistant, and communication documented in the PACS or zVision Dashboard. Electronically Signed   By: Abigail Miyamoto M.D.   On: 07/14/2018 16:54    Assessment & Plan:   There are no diagnoses linked to this encounter.   No orders of the defined types were placed in this encounter.    Follow-up: No follow-ups on file.  [name], MD

## 2018-12-22 DIAGNOSIS — R2681 Unsteadiness on feet: Secondary | ICD-10-CM | POA: Diagnosis not present

## 2018-12-22 DIAGNOSIS — M6281 Muscle weakness (generalized): Secondary | ICD-10-CM | POA: Diagnosis not present

## 2018-12-22 DIAGNOSIS — M069 Rheumatoid arthritis, unspecified: Secondary | ICD-10-CM | POA: Diagnosis not present

## 2018-12-22 DIAGNOSIS — M19072 Primary osteoarthritis, left ankle and foot: Secondary | ICD-10-CM | POA: Diagnosis not present

## 2018-12-22 DIAGNOSIS — M25572 Pain in left ankle and joints of left foot: Secondary | ICD-10-CM | POA: Diagnosis not present

## 2018-12-24 DIAGNOSIS — M6281 Muscle weakness (generalized): Secondary | ICD-10-CM | POA: Diagnosis not present

## 2018-12-24 DIAGNOSIS — M25572 Pain in left ankle and joints of left foot: Secondary | ICD-10-CM | POA: Diagnosis not present

## 2018-12-24 DIAGNOSIS — M069 Rheumatoid arthritis, unspecified: Secondary | ICD-10-CM | POA: Diagnosis not present

## 2018-12-24 DIAGNOSIS — R2681 Unsteadiness on feet: Secondary | ICD-10-CM | POA: Diagnosis not present

## 2018-12-24 DIAGNOSIS — M19072 Primary osteoarthritis, left ankle and foot: Secondary | ICD-10-CM | POA: Diagnosis not present

## 2018-12-24 NOTE — Addendum Note (Signed)
Addended by: Karren Cobble on: 12/24/2018 10:56 AM   Modules accepted: Orders

## 2018-12-25 DIAGNOSIS — M19072 Primary osteoarthritis, left ankle and foot: Secondary | ICD-10-CM | POA: Diagnosis not present

## 2018-12-25 DIAGNOSIS — M25572 Pain in left ankle and joints of left foot: Secondary | ICD-10-CM | POA: Diagnosis not present

## 2018-12-25 DIAGNOSIS — M6281 Muscle weakness (generalized): Secondary | ICD-10-CM | POA: Diagnosis not present

## 2018-12-25 DIAGNOSIS — R2681 Unsteadiness on feet: Secondary | ICD-10-CM | POA: Diagnosis not present

## 2018-12-25 DIAGNOSIS — M069 Rheumatoid arthritis, unspecified: Secondary | ICD-10-CM | POA: Diagnosis not present

## 2018-12-30 ENCOUNTER — Ambulatory Visit: Payer: Medicare Other | Admitting: Internal Medicine

## 2019-01-05 DIAGNOSIS — M19072 Primary osteoarthritis, left ankle and foot: Secondary | ICD-10-CM | POA: Diagnosis not present

## 2019-01-05 DIAGNOSIS — M25572 Pain in left ankle and joints of left foot: Secondary | ICD-10-CM | POA: Diagnosis not present

## 2019-01-05 DIAGNOSIS — R2681 Unsteadiness on feet: Secondary | ICD-10-CM | POA: Diagnosis not present

## 2019-01-05 DIAGNOSIS — M069 Rheumatoid arthritis, unspecified: Secondary | ICD-10-CM | POA: Diagnosis not present

## 2019-01-05 DIAGNOSIS — M6281 Muscle weakness (generalized): Secondary | ICD-10-CM | POA: Diagnosis not present

## 2019-01-07 DIAGNOSIS — M25572 Pain in left ankle and joints of left foot: Secondary | ICD-10-CM | POA: Diagnosis not present

## 2019-01-07 DIAGNOSIS — R2681 Unsteadiness on feet: Secondary | ICD-10-CM | POA: Diagnosis not present

## 2019-01-07 DIAGNOSIS — M6281 Muscle weakness (generalized): Secondary | ICD-10-CM | POA: Diagnosis not present

## 2019-01-07 DIAGNOSIS — M069 Rheumatoid arthritis, unspecified: Secondary | ICD-10-CM | POA: Diagnosis not present

## 2019-01-07 DIAGNOSIS — M19072 Primary osteoarthritis, left ankle and foot: Secondary | ICD-10-CM | POA: Diagnosis not present

## 2019-01-08 DIAGNOSIS — M19072 Primary osteoarthritis, left ankle and foot: Secondary | ICD-10-CM | POA: Diagnosis not present

## 2019-01-08 DIAGNOSIS — M069 Rheumatoid arthritis, unspecified: Secondary | ICD-10-CM | POA: Diagnosis not present

## 2019-01-08 DIAGNOSIS — M25572 Pain in left ankle and joints of left foot: Secondary | ICD-10-CM | POA: Diagnosis not present

## 2019-01-08 DIAGNOSIS — M6281 Muscle weakness (generalized): Secondary | ICD-10-CM | POA: Diagnosis not present

## 2019-01-08 DIAGNOSIS — R2681 Unsteadiness on feet: Secondary | ICD-10-CM | POA: Diagnosis not present

## 2019-01-11 ENCOUNTER — Inpatient Hospital Stay: Payer: Medicare Other

## 2019-01-11 ENCOUNTER — Encounter: Payer: Self-pay | Admitting: Hematology & Oncology

## 2019-01-11 ENCOUNTER — Other Ambulatory Visit: Payer: Self-pay

## 2019-01-11 ENCOUNTER — Telehealth: Payer: Self-pay | Admitting: Hematology & Oncology

## 2019-01-11 ENCOUNTER — Inpatient Hospital Stay: Payer: Medicare Other | Attending: Hematology & Oncology | Admitting: Hematology & Oncology

## 2019-01-11 VITALS — BP 137/68 | HR 57 | Temp 97.7°F | Wt 143.0 lb

## 2019-01-11 VITALS — Wt 143.0 lb

## 2019-01-11 DIAGNOSIS — C4372 Malignant melanoma of left lower limb, including hip: Secondary | ICD-10-CM | POA: Diagnosis not present

## 2019-01-11 DIAGNOSIS — D500 Iron deficiency anemia secondary to blood loss (chronic): Secondary | ICD-10-CM

## 2019-01-11 DIAGNOSIS — Z85820 Personal history of malignant melanoma of skin: Secondary | ICD-10-CM | POA: Insufficient documentation

## 2019-01-11 DIAGNOSIS — Z79899 Other long term (current) drug therapy: Secondary | ICD-10-CM | POA: Insufficient documentation

## 2019-01-11 DIAGNOSIS — Z95828 Presence of other vascular implants and grafts: Secondary | ICD-10-CM

## 2019-01-11 DIAGNOSIS — I2510 Atherosclerotic heart disease of native coronary artery without angina pectoris: Secondary | ICD-10-CM

## 2019-01-11 LAB — CMP (CANCER CENTER ONLY)
ALT: 10 U/L (ref 0–44)
AST: 11 U/L — ABNORMAL LOW (ref 15–41)
Albumin: 3.9 g/dL (ref 3.5–5.0)
Alkaline Phosphatase: 58 U/L (ref 38–126)
Anion gap: 7 (ref 5–15)
BUN: 21 mg/dL (ref 8–23)
CO2: 30 mmol/L (ref 22–32)
Calcium: 8.9 mg/dL (ref 8.9–10.3)
Chloride: 98 mmol/L (ref 98–111)
Creatinine: 1.06 mg/dL (ref 0.61–1.24)
GFR, Est AFR Am: >60 mL/min (ref >60)
GFR, Estimated: >60 mL/min (ref >60)
Glucose, Bld: 104 mg/dL — ABNORMAL HIGH (ref 70–99)
Potassium: 3.7 mmol/L (ref 3.5–5.1)
Sodium: 135 mmol/L (ref 135–145)
Total Bilirubin: 0.7 mg/dL (ref 0.3–1.2)
Total Protein: 6.4 g/dL — ABNORMAL LOW (ref 6.5–8.1)

## 2019-01-11 LAB — CBC WITH DIFFERENTIAL (CANCER CENTER ONLY)
Abs Immature Granulocytes: 0.04 10*3/uL (ref 0.00–0.07)
Basophils Absolute: 0 10*3/uL (ref 0.0–0.1)
Basophils Relative: 1 %
Eosinophils Absolute: 0 10*3/uL (ref 0.0–0.5)
Eosinophils Relative: 0 %
HCT: 38.4 % — ABNORMAL LOW (ref 39.0–52.0)
Hemoglobin: 12.7 g/dL — ABNORMAL LOW (ref 13.0–17.0)
Immature Granulocytes: 1 %
Lymphocytes Relative: 16 %
Lymphs Abs: 1.3 10*3/uL (ref 0.7–4.0)
MCH: 31.6 pg (ref 26.0–34.0)
MCHC: 33.1 g/dL (ref 30.0–36.0)
MCV: 95.5 fL (ref 80.0–100.0)
Monocytes Absolute: 0.6 10*3/uL (ref 0.1–1.0)
Monocytes Relative: 8 %
Neutro Abs: 5.8 10*3/uL (ref 1.7–7.7)
Neutrophils Relative %: 74 %
Platelet Count: 257 10*3/uL (ref 150–400)
RBC: 4.02 MIL/uL — ABNORMAL LOW (ref 4.22–5.81)
RDW: 13.5 % (ref 11.5–15.5)
WBC Count: 7.8 10*3/uL (ref 4.0–10.5)
nRBC: 0 % (ref 0.0–0.2)

## 2019-01-11 LAB — LACTATE DEHYDROGENASE: LDH: 168 U/L (ref 98–192)

## 2019-01-11 MED ORDER — HEPARIN SOD (PORK) LOCK FLUSH 100 UNIT/ML IV SOLN
500.0000 [IU] | Freq: Once | INTRAVENOUS | Status: AC
  Administered 2019-01-11: 10:00:00 500 [IU] via INTRAVENOUS
  Filled 2019-01-11: qty 5

## 2019-01-11 MED ORDER — SODIUM CHLORIDE 0.9% FLUSH
10.0000 mL | INTRAVENOUS | Status: DC | PRN
  Administered 2019-01-11: 10:00:00 10 mL via INTRAVENOUS
  Filled 2019-01-11: qty 10

## 2019-01-11 NOTE — Telephone Encounter (Signed)
lmom for patients Nov and Jan appts per 10/5 los

## 2019-01-11 NOTE — Progress Notes (Signed)
Hematology and Oncology Follow Up Visit  [name] [id number] Jul 13, 1945 73 y.o. 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  We last saw him back in July.  Since then, he and his wife are at the assisted living facility.  He has been trying to exercise.  He likes to exercise.  I think he is doing some physical therapy.  He has had no problems with diarrhea.  Hopefully this will be no issue for him.  He is eating well.  He has had no headache.  There is been no problems with respect to his hemochromatosis.  We saw him back in July, his ferritin was 79 with an iron saturation of only 28%.  He has had no leg swelling.  He has had no rashes.  There is been no cough or shortness of breath.    Overall, his performance status is ECOG 1.             Overall, his performance status is ECOG 1.  Status: 1 - Symptomatic but completely ambulatory  Medications:  Allergies as of 01/11/2019      Reactions   Cefepime Hives, Shortness Of Breath   Cephaeline Hives   Morphine Swelling   A swollen stomach.   Morphine And Related Shortness Of Breath, Nausea And Vomiting, Swelling, Other (See Comments)   Agitation, tolerates dilaudid Other reaction(s): Other (See Comments) Agitation, tolerates dilaudid   Penicillins Hives, Shortness Of Breath, Rash   Has patient had a PCN reaction causing immediate rash, facial/tongue/throat swelling, SOB or lightheadedness with hypotension: Yes Has patient had a PCN reaction causing severe rash involving mucus  membranes or skin necrosis: Yes Has patient had a PCN reaction that required hospitalization No Has patient had a PCN reaction occurring within the last 10 years: No If all of the above answers are "NO", then may proceed with Cephalosporin use.   Doxycycline Rash   Oxycodone-acetaminophen Other (See Comments)   Patient doesn't remember what type of reaction.      Medication List       Accurate as of January 11, 2019 11:23 AM. If you have any questions, ask your nurse or doctor.        albuterol 108 (90 Base) MCG/ACT inhaler Commonly known as: VENTOLIN HFA Inhale 1-2 puffs into the lungs every 4 (four) hours as needed for wheezing or shortness of breath.   alfuzosin 10 MG 24 hr tablet Commonly known as: UROXATRAL Take 10 mg by mouth at bedtime.   ALPRAZolam 0.5 MG tablet Commonly known as: XANAX Take 1 tablet (0.5 mg total) by mouth 2 (two) times daily as needed. for anxiety   B-6 PO Take 1 tablet by mouth.   Cialis 5 MG tablet Generic drug: tadalafil Take 5 mg by mouth as needed.   clopidogrel 75 MG tablet Commonly known as: PLAVIX TAKE 1 TABLET BY MOUTH EVERY DAY WITH BREAKFAST   dronabinol 5 MG capsule Commonly known as: MARINOL Take 1 capsule (5 mg total) by mouth 2 (two) times daily before a meal.   Fish Oil Omega-3 1000 MG Caps Take 1,000  mg by mouth daily.   FLUoxetine 40 MG capsule Commonly known as: PROZAC TAKE 1 CAPSULE BY MOUTH TWICE DAILY   fluticasone 50 MCG/ACT nasal spray Commonly known as: FLONASE INSTILL 2 SPRAYS IN EACH NOSTRIL EVERY DAY   gabapentin 300 MG capsule Commonly known as: NEURONTIN TAKE 1 CAPSULE(300 MG) BY MOUTH TWICE DAILY   HYDROmorphone 2 MG tablet Commonly known as: DILAUDID Take 1 tablet (2 mg total) by mouth every 8 (eight) hours as needed for severe pain.   lidocaine-prilocaine cream Commonly known as: EMLA Apply 1 application topically as needed.   losartan-hydrochlorothiazide 50-12.5 MG tablet Commonly known as:  HYZAAR TAKE 1 TABLET BY MOUTH DAILY   Melatonin 5 MG Tabs Take 5 mg by mouth at bedtime. Takes 10 mg at HS   metoprolol tartrate 25 MG tablet Commonly known as: LOPRESSOR TAKE 1 TABLET (25 MG TOTAL) BY MOUTH 2 (TWO) TIMES DAILY.   ondansetron 8 MG tablet Commonly known as: ZOFRAN Take 1 tablet (8 mg total) by mouth 3 (three) times daily.   pantoprazole 40 MG tablet Commonly known as: PROTONIX TAKE 1 TABLET (40 MG TOTAL) BY MOUTH EVERY EVENING.   pravastatin 20 MG tablet Commonly known as: PRAVACHOL Take 1 tablet (20 mg total) by mouth daily. What changed: when to take this   predniSONE 5 MG tablet Commonly known as: DELTASONE TAKE 2 TABLETS (10 MG TOTAL) BY MOUTH DAILY WITH BREAKFAST.   prochlorperazine 10 MG tablet Commonly known as: COMPAZINE TAKE 1 TABLET(10 MG) BY MOUTH EVERY 6 HOURS AS NEEDED FOR NAUSEA OR VOMITING   torsemide 10 MG tablet Commonly known as: Demadex Take 1-2 tablets (10-20 mg total) by mouth daily. What changed:   when to take this  reasons to take this   traZODone 100 MG tablet Commonly known as: DESYREL 1 po at hs   vitamin B-12 1000 MCG tablet Commonly known as: CYANOCOBALAMIN Take 1,000 mcg by mouth daily.   Vitamin D3 50 MCG (2000 UT) capsule Take 1 capsule (2,000 Units total) by mouth daily.       Allergies:  Allergies  Allergen Reactions  . Cefepime Hives and Shortness Of Breath  . Cephaeline Hives  . Morphine Swelling    A swollen stomach.  . Morphine And Related Shortness Of Breath, Nausea And Vomiting, Swelling and Other (See Comments)    Agitation, tolerates dilaudid Other reaction(s): Other (See Comments) Agitation, tolerates dilaudid  . Penicillins Hives, Shortness Of Breath and Rash    Has patient had a PCN reaction causing immediate rash, facial/tongue/throat swelling, SOB or lightheadedness with hypotension: Yes Has patient had a PCN reaction causing severe rash involving mucus membranes or skin necrosis: Yes  Has patient had a PCN reaction that required hospitalization No Has patient had a PCN reaction occurring within the last 10 years: No If all of the above answers are "NO", then may proceed with Cephalosporin use.   [name] Doxycycline Rash  . Oxycodone-Acetaminophen Other (See Comments)    Patient doesn't remember what type of reaction.    Past Medical History, Surgical history, Social history, and Family History were reviewed and updated.  Review of Systems: Review of Systems  Constitutional: Negative.   HENT: Negative.   Eyes: Negative.   Respiratory: Negative.   Cardiovascular: Negative.   Gastrointestinal: Negative.   Genitourinary: Negative.   Musculoskeletal: Negative.   Skin: Negative.   Neurological: Negative.   Endo/Heme/Allergies: Negative.   Psychiatric/Behavioral: Negative.     Physical Exam:  weight is 143  lb (64.9 kg).   Wt Readings from Last 3 Encounters:  01/11/19 143 lb (64.9 kg)  01/11/19 143 lb (64.9 kg)  12/21/18 143 lb (64.9 kg)   Physical Exam Vitals signs reviewed.  HENT:     Head: Normocephalic and atraumatic.  Eyes:     Pupils: Pupils are equal, round, and reactive to light.  Neck:     Musculoskeletal: Normal range of motion.  Cardiovascular:     Rate and Rhythm: Normal rate and regular rhythm.     Heart sounds: Normal heart sounds.  Pulmonary:     Effort: Pulmonary effort is normal.     Breath sounds: Normal breath sounds.  Abdominal:     General: Bowel sounds are normal.     Palpations: Abdomen is soft.  Musculoskeletal: Normal range of motion.        General: No tenderness or deformity.  Lymphadenopathy:     Cervical: No cervical adenopathy.  Skin:    General: Skin is warm and dry.     Findings: No erythema or rash.  Neurological:     Mental Status: He is alert and oriented to person, place, and time.  Psychiatric:        Behavior: Behavior normal.        Thought Content: Thought content normal.        Judgment: Judgment normal.       Lab Results  Component Value Date   WBC 7.8 01/11/2019   HGB 12.7 (L) 01/11/2019   HCT 38.4 (L) 01/11/2019   MCV 95.5 01/11/2019   PLT 257 01/11/2019   Lab Results  Component Value Date   FERRITIN 79 10/21/2018   IRON 62 10/21/2018   TIBC 223 10/21/2018   UIBC 160 10/21/2018   IRONPCTSAT 28 10/21/2018   Lab Results  Component Value Date   RETICCTPCT 2.1 06/11/2018   RBC 4.02 (L) 01/11/2019   No results found for: KPAFRELGTCHN, LAMBDASER, KAPLAMBRATIO No results found for: IGGSERUM, IGA, IGMSERUM No results found for: Odetta Pink, SPEI   Chemistry      Component Value Date/Time   NA 135 01/11/2019 1020   NA 140 04/10/2017 1046   NA 135 (L) 03/06/2017 1104   K 3.7 01/11/2019 1020   K 3.7 04/10/2017 1046   K 4.2 03/06/2017 1104   CL 98 01/11/2019 1020   CL 98 04/10/2017 1046   CO2 30 01/11/2019 1020   CO2 31 04/10/2017 1046   CO2 30 (H) 03/06/2017 1104   BUN 21 01/11/2019 1020   BUN 21 04/10/2017 1046   BUN 20.0 03/06/2017 1104   CREATININE 1.06 01/11/2019 1020   CREATININE 0.9 04/10/2017 1046   CREATININE 1.0 03/06/2017 1104      Component Value Date/Time   CALCIUM 8.9 01/11/2019 1020   CALCIUM 8.9 04/10/2017 1046   CALCIUM 9.4 03/06/2017 1104   ALKPHOS 58 01/11/2019 1020   ALKPHOS 65 04/10/2017 1046   ALKPHOS 78 03/06/2017 1104   AST 11 (L) 01/11/2019 1020   AST 20 03/06/2017 1104   ALT 10 01/11/2019 1020   ALT 21 04/10/2017 1046   ALT 20 03/06/2017 1104   BILITOT 0.7 01/11/2019 1020   BILITOT 0.47 03/06/2017 1104       Impression and Plan: [name] is a very pleasant 73 yo caucasian gentleman with history of hemochromatosis, homozygous for the C282Y mutation which has not been a problem for him.  He also has history of stage IIIC  nodular melanoma of the left thigh. He completed treatment with Opdivo in October 2019.  I am glad that he is doing so well.  I am glad that his quality  of life is what he wants it to be.  I know that the melanoma can always come back.  So far, we have not found that this is the case.  I do not see any issue or any reason to do scans on him right now.  We will now get him through the holiday season and get him back in January 2021   [name], MD 10/5/202011:23 AM

## 2019-01-12 ENCOUNTER — Telehealth: Payer: Self-pay | Admitting: Hematology & Oncology

## 2019-01-12 DIAGNOSIS — M25572 Pain in left ankle and joints of left foot: Secondary | ICD-10-CM | POA: Diagnosis not present

## 2019-01-12 DIAGNOSIS — M6281 Muscle weakness (generalized): Secondary | ICD-10-CM | POA: Diagnosis not present

## 2019-01-12 DIAGNOSIS — M19072 Primary osteoarthritis, left ankle and foot: Secondary | ICD-10-CM | POA: Diagnosis not present

## 2019-01-12 DIAGNOSIS — R2681 Unsteadiness on feet: Secondary | ICD-10-CM | POA: Diagnosis not present

## 2019-01-12 DIAGNOSIS — M069 Rheumatoid arthritis, unspecified: Secondary | ICD-10-CM | POA: Diagnosis not present

## 2019-01-12 LAB — IRON AND TIBC
Iron: 103 ug/dL (ref 42–163)
Saturation Ratios: 59 % — ABNORMAL HIGH (ref 20–55)
TIBC: 175 ug/dL — ABNORMAL LOW (ref 202–409)
UIBC: 72 ug/dL — ABNORMAL LOW (ref 117–376)

## 2019-01-12 LAB — FERRITIN: Ferritin: 100 ng/mL (ref 24–336)

## 2019-01-12 NOTE — Telephone Encounter (Signed)
lmom to inform patient of phlebotomy 10/9 at 2 pm per 10/5 result note

## 2019-01-13 DIAGNOSIS — M6281 Muscle weakness (generalized): Secondary | ICD-10-CM | POA: Diagnosis not present

## 2019-01-13 DIAGNOSIS — R2681 Unsteadiness on feet: Secondary | ICD-10-CM | POA: Diagnosis not present

## 2019-01-13 DIAGNOSIS — M19072 Primary osteoarthritis, left ankle and foot: Secondary | ICD-10-CM | POA: Diagnosis not present

## 2019-01-13 DIAGNOSIS — M069 Rheumatoid arthritis, unspecified: Secondary | ICD-10-CM | POA: Diagnosis not present

## 2019-01-13 DIAGNOSIS — M25572 Pain in left ankle and joints of left foot: Secondary | ICD-10-CM | POA: Diagnosis not present

## 2019-01-14 DIAGNOSIS — R2681 Unsteadiness on feet: Secondary | ICD-10-CM | POA: Diagnosis not present

## 2019-01-14 DIAGNOSIS — M069 Rheumatoid arthritis, unspecified: Secondary | ICD-10-CM | POA: Diagnosis not present

## 2019-01-14 DIAGNOSIS — M25572 Pain in left ankle and joints of left foot: Secondary | ICD-10-CM | POA: Diagnosis not present

## 2019-01-14 DIAGNOSIS — M6281 Muscle weakness (generalized): Secondary | ICD-10-CM | POA: Diagnosis not present

## 2019-01-14 DIAGNOSIS — M19072 Primary osteoarthritis, left ankle and foot: Secondary | ICD-10-CM | POA: Diagnosis not present

## 2019-01-15 ENCOUNTER — Other Ambulatory Visit: Payer: Self-pay

## 2019-01-15 ENCOUNTER — Inpatient Hospital Stay: Payer: Medicare Other

## 2019-01-15 VITALS — BP 105/63 | HR 64 | Temp 97.7°F | Resp 17

## 2019-01-15 DIAGNOSIS — M6281 Muscle weakness (generalized): Secondary | ICD-10-CM | POA: Diagnosis not present

## 2019-01-15 DIAGNOSIS — M25572 Pain in left ankle and joints of left foot: Secondary | ICD-10-CM | POA: Diagnosis not present

## 2019-01-15 DIAGNOSIS — M069 Rheumatoid arthritis, unspecified: Secondary | ICD-10-CM | POA: Diagnosis not present

## 2019-01-15 DIAGNOSIS — R2681 Unsteadiness on feet: Secondary | ICD-10-CM | POA: Diagnosis not present

## 2019-01-15 DIAGNOSIS — Z79899 Other long term (current) drug therapy: Secondary | ICD-10-CM | POA: Diagnosis not present

## 2019-01-15 DIAGNOSIS — Z85820 Personal history of malignant melanoma of skin: Secondary | ICD-10-CM | POA: Diagnosis not present

## 2019-01-15 DIAGNOSIS — M19072 Primary osteoarthritis, left ankle and foot: Secondary | ICD-10-CM | POA: Diagnosis not present

## 2019-01-15 DIAGNOSIS — C4372 Malignant melanoma of left lower limb, including hip: Secondary | ICD-10-CM

## 2019-01-15 MED ORDER — SODIUM CHLORIDE 0.9% FLUSH
10.0000 mL | INTRAVENOUS | Status: DC | PRN
  Administered 2019-01-15: 15:00:00 10 mL via INTRAVENOUS
  Filled 2019-01-15: qty 10

## 2019-01-15 MED ORDER — HEPARIN SOD (PORK) LOCK FLUSH 100 UNIT/ML IV SOLN
500.0000 [IU] | Freq: Once | INTRAVENOUS | Status: AC
  Administered 2019-01-15: 15:00:00 500 [IU] via INTRAVENOUS
  Filled 2019-01-15: qty 5

## 2019-01-15 NOTE — Patient Instructions (Signed)

## 2019-01-18 DIAGNOSIS — R2681 Unsteadiness on feet: Secondary | ICD-10-CM | POA: Diagnosis not present

## 2019-01-18 DIAGNOSIS — M25572 Pain in left ankle and joints of left foot: Secondary | ICD-10-CM | POA: Diagnosis not present

## 2019-01-18 DIAGNOSIS — M069 Rheumatoid arthritis, unspecified: Secondary | ICD-10-CM | POA: Diagnosis not present

## 2019-01-18 DIAGNOSIS — M6281 Muscle weakness (generalized): Secondary | ICD-10-CM | POA: Diagnosis not present

## 2019-01-18 DIAGNOSIS — M19072 Primary osteoarthritis, left ankle and foot: Secondary | ICD-10-CM | POA: Diagnosis not present

## 2019-01-19 DIAGNOSIS — M069 Rheumatoid arthritis, unspecified: Secondary | ICD-10-CM | POA: Diagnosis not present

## 2019-01-19 DIAGNOSIS — M6281 Muscle weakness (generalized): Secondary | ICD-10-CM | POA: Diagnosis not present

## 2019-01-19 DIAGNOSIS — M19072 Primary osteoarthritis, left ankle and foot: Secondary | ICD-10-CM | POA: Diagnosis not present

## 2019-01-19 DIAGNOSIS — R2681 Unsteadiness on feet: Secondary | ICD-10-CM | POA: Diagnosis not present

## 2019-01-19 DIAGNOSIS — M25572 Pain in left ankle and joints of left foot: Secondary | ICD-10-CM | POA: Diagnosis not present

## 2019-01-20 DIAGNOSIS — M6281 Muscle weakness (generalized): Secondary | ICD-10-CM | POA: Diagnosis not present

## 2019-01-20 DIAGNOSIS — M069 Rheumatoid arthritis, unspecified: Secondary | ICD-10-CM | POA: Diagnosis not present

## 2019-01-20 DIAGNOSIS — M19072 Primary osteoarthritis, left ankle and foot: Secondary | ICD-10-CM | POA: Diagnosis not present

## 2019-01-20 DIAGNOSIS — R2681 Unsteadiness on feet: Secondary | ICD-10-CM | POA: Diagnosis not present

## 2019-01-20 DIAGNOSIS — M25572 Pain in left ankle and joints of left foot: Secondary | ICD-10-CM | POA: Diagnosis not present

## 2019-01-21 DIAGNOSIS — M25572 Pain in left ankle and joints of left foot: Secondary | ICD-10-CM | POA: Diagnosis not present

## 2019-01-21 DIAGNOSIS — R2681 Unsteadiness on feet: Secondary | ICD-10-CM | POA: Diagnosis not present

## 2019-01-21 DIAGNOSIS — M19072 Primary osteoarthritis, left ankle and foot: Secondary | ICD-10-CM | POA: Diagnosis not present

## 2019-01-21 DIAGNOSIS — M069 Rheumatoid arthritis, unspecified: Secondary | ICD-10-CM | POA: Diagnosis not present

## 2019-01-21 DIAGNOSIS — M6281 Muscle weakness (generalized): Secondary | ICD-10-CM | POA: Diagnosis not present

## 2019-01-22 DIAGNOSIS — L570 Actinic keratosis: Secondary | ICD-10-CM | POA: Diagnosis not present

## 2019-01-22 DIAGNOSIS — L821 Other seborrheic keratosis: Secondary | ICD-10-CM | POA: Diagnosis not present

## 2019-01-22 DIAGNOSIS — Z85820 Personal history of malignant melanoma of skin: Secondary | ICD-10-CM | POA: Diagnosis not present

## 2019-01-22 DIAGNOSIS — D1801 Hemangioma of skin and subcutaneous tissue: Secondary | ICD-10-CM | POA: Diagnosis not present

## 2019-01-22 DIAGNOSIS — L905 Scar conditions and fibrosis of skin: Secondary | ICD-10-CM | POA: Diagnosis not present

## 2019-01-22 DIAGNOSIS — D692 Other nonthrombocytopenic purpura: Secondary | ICD-10-CM | POA: Diagnosis not present

## 2019-01-22 DIAGNOSIS — D225 Melanocytic nevi of trunk: Secondary | ICD-10-CM | POA: Diagnosis not present

## 2019-01-26 DIAGNOSIS — M6281 Muscle weakness (generalized): Secondary | ICD-10-CM | POA: Diagnosis not present

## 2019-01-26 DIAGNOSIS — M19072 Primary osteoarthritis, left ankle and foot: Secondary | ICD-10-CM | POA: Diagnosis not present

## 2019-01-26 DIAGNOSIS — M25572 Pain in left ankle and joints of left foot: Secondary | ICD-10-CM | POA: Diagnosis not present

## 2019-01-26 DIAGNOSIS — M069 Rheumatoid arthritis, unspecified: Secondary | ICD-10-CM | POA: Diagnosis not present

## 2019-01-26 DIAGNOSIS — R2681 Unsteadiness on feet: Secondary | ICD-10-CM | POA: Diagnosis not present

## 2019-01-27 DIAGNOSIS — R2681 Unsteadiness on feet: Secondary | ICD-10-CM | POA: Diagnosis not present

## 2019-01-27 DIAGNOSIS — M25572 Pain in left ankle and joints of left foot: Secondary | ICD-10-CM | POA: Diagnosis not present

## 2019-01-27 DIAGNOSIS — M6281 Muscle weakness (generalized): Secondary | ICD-10-CM | POA: Diagnosis not present

## 2019-01-27 DIAGNOSIS — M19072 Primary osteoarthritis, left ankle and foot: Secondary | ICD-10-CM | POA: Diagnosis not present

## 2019-01-27 DIAGNOSIS — M069 Rheumatoid arthritis, unspecified: Secondary | ICD-10-CM | POA: Diagnosis not present

## 2019-01-29 DIAGNOSIS — R2681 Unsteadiness on feet: Secondary | ICD-10-CM | POA: Diagnosis not present

## 2019-01-29 DIAGNOSIS — M069 Rheumatoid arthritis, unspecified: Secondary | ICD-10-CM | POA: Diagnosis not present

## 2019-01-29 DIAGNOSIS — M25572 Pain in left ankle and joints of left foot: Secondary | ICD-10-CM | POA: Diagnosis not present

## 2019-01-29 DIAGNOSIS — M6281 Muscle weakness (generalized): Secondary | ICD-10-CM | POA: Diagnosis not present

## 2019-01-29 DIAGNOSIS — M19072 Primary osteoarthritis, left ankle and foot: Secondary | ICD-10-CM | POA: Diagnosis not present

## 2019-02-01 DIAGNOSIS — M069 Rheumatoid arthritis, unspecified: Secondary | ICD-10-CM | POA: Diagnosis not present

## 2019-02-01 DIAGNOSIS — M6281 Muscle weakness (generalized): Secondary | ICD-10-CM | POA: Diagnosis not present

## 2019-02-01 DIAGNOSIS — M19072 Primary osteoarthritis, left ankle and foot: Secondary | ICD-10-CM | POA: Diagnosis not present

## 2019-02-01 DIAGNOSIS — M25572 Pain in left ankle and joints of left foot: Secondary | ICD-10-CM | POA: Diagnosis not present

## 2019-02-01 DIAGNOSIS — R2681 Unsteadiness on feet: Secondary | ICD-10-CM | POA: Diagnosis not present

## 2019-02-02 DIAGNOSIS — R2681 Unsteadiness on feet: Secondary | ICD-10-CM | POA: Diagnosis not present

## 2019-02-02 DIAGNOSIS — M069 Rheumatoid arthritis, unspecified: Secondary | ICD-10-CM | POA: Diagnosis not present

## 2019-02-02 DIAGNOSIS — M25572 Pain in left ankle and joints of left foot: Secondary | ICD-10-CM | POA: Diagnosis not present

## 2019-02-02 DIAGNOSIS — M19072 Primary osteoarthritis, left ankle and foot: Secondary | ICD-10-CM | POA: Diagnosis not present

## 2019-02-02 DIAGNOSIS — M6281 Muscle weakness (generalized): Secondary | ICD-10-CM | POA: Diagnosis not present

## 2019-02-03 DIAGNOSIS — H3581 Retinal edema: Secondary | ICD-10-CM | POA: Diagnosis not present

## 2019-02-03 DIAGNOSIS — H43813 Vitreous degeneration, bilateral: Secondary | ICD-10-CM | POA: Diagnosis not present

## 2019-02-03 DIAGNOSIS — H35033 Hypertensive retinopathy, bilateral: Secondary | ICD-10-CM | POA: Diagnosis not present

## 2019-02-03 DIAGNOSIS — Z961 Presence of intraocular lens: Secondary | ICD-10-CM | POA: Diagnosis not present

## 2019-02-05 DIAGNOSIS — M25572 Pain in left ankle and joints of left foot: Secondary | ICD-10-CM | POA: Diagnosis not present

## 2019-02-05 DIAGNOSIS — M069 Rheumatoid arthritis, unspecified: Secondary | ICD-10-CM | POA: Diagnosis not present

## 2019-02-05 DIAGNOSIS — M19072 Primary osteoarthritis, left ankle and foot: Secondary | ICD-10-CM | POA: Diagnosis not present

## 2019-02-05 DIAGNOSIS — R2681 Unsteadiness on feet: Secondary | ICD-10-CM | POA: Diagnosis not present

## 2019-02-05 DIAGNOSIS — M6281 Muscle weakness (generalized): Secondary | ICD-10-CM | POA: Diagnosis not present

## 2019-02-08 DIAGNOSIS — M6281 Muscle weakness (generalized): Secondary | ICD-10-CM | POA: Diagnosis not present

## 2019-02-08 DIAGNOSIS — R2681 Unsteadiness on feet: Secondary | ICD-10-CM | POA: Diagnosis not present

## 2019-02-08 DIAGNOSIS — M069 Rheumatoid arthritis, unspecified: Secondary | ICD-10-CM | POA: Diagnosis not present

## 2019-02-08 DIAGNOSIS — M19072 Primary osteoarthritis, left ankle and foot: Secondary | ICD-10-CM | POA: Diagnosis not present

## 2019-02-08 DIAGNOSIS — M25572 Pain in left ankle and joints of left foot: Secondary | ICD-10-CM | POA: Diagnosis not present

## 2019-02-09 DIAGNOSIS — R2681 Unsteadiness on feet: Secondary | ICD-10-CM | POA: Diagnosis not present

## 2019-02-09 DIAGNOSIS — M6281 Muscle weakness (generalized): Secondary | ICD-10-CM | POA: Diagnosis not present

## 2019-02-09 DIAGNOSIS — M25572 Pain in left ankle and joints of left foot: Secondary | ICD-10-CM | POA: Diagnosis not present

## 2019-02-09 DIAGNOSIS — M069 Rheumatoid arthritis, unspecified: Secondary | ICD-10-CM | POA: Diagnosis not present

## 2019-02-09 DIAGNOSIS — M19072 Primary osteoarthritis, left ankle and foot: Secondary | ICD-10-CM | POA: Diagnosis not present

## 2019-02-12 DIAGNOSIS — R2681 Unsteadiness on feet: Secondary | ICD-10-CM | POA: Diagnosis not present

## 2019-02-12 DIAGNOSIS — M069 Rheumatoid arthritis, unspecified: Secondary | ICD-10-CM | POA: Diagnosis not present

## 2019-02-12 DIAGNOSIS — M6281 Muscle weakness (generalized): Secondary | ICD-10-CM | POA: Diagnosis not present

## 2019-02-12 DIAGNOSIS — M19072 Primary osteoarthritis, left ankle and foot: Secondary | ICD-10-CM | POA: Diagnosis not present

## 2019-02-12 DIAGNOSIS — M25572 Pain in left ankle and joints of left foot: Secondary | ICD-10-CM | POA: Diagnosis not present

## 2019-02-16 DIAGNOSIS — M069 Rheumatoid arthritis, unspecified: Secondary | ICD-10-CM | POA: Diagnosis not present

## 2019-02-16 DIAGNOSIS — M19072 Primary osteoarthritis, left ankle and foot: Secondary | ICD-10-CM | POA: Diagnosis not present

## 2019-02-16 DIAGNOSIS — M6281 Muscle weakness (generalized): Secondary | ICD-10-CM | POA: Diagnosis not present

## 2019-02-16 DIAGNOSIS — R2681 Unsteadiness on feet: Secondary | ICD-10-CM | POA: Diagnosis not present

## 2019-02-16 DIAGNOSIS — M25572 Pain in left ankle and joints of left foot: Secondary | ICD-10-CM | POA: Diagnosis not present

## 2019-02-18 DIAGNOSIS — M19072 Primary osteoarthritis, left ankle and foot: Secondary | ICD-10-CM | POA: Diagnosis not present

## 2019-02-18 DIAGNOSIS — R2681 Unsteadiness on feet: Secondary | ICD-10-CM | POA: Diagnosis not present

## 2019-02-18 DIAGNOSIS — M6281 Muscle weakness (generalized): Secondary | ICD-10-CM | POA: Diagnosis not present

## 2019-02-18 DIAGNOSIS — M069 Rheumatoid arthritis, unspecified: Secondary | ICD-10-CM | POA: Diagnosis not present

## 2019-02-18 DIAGNOSIS — M25572 Pain in left ankle and joints of left foot: Secondary | ICD-10-CM | POA: Diagnosis not present

## 2019-02-19 NOTE — Progress Notes (Signed)
Date:  02/24/2019   ID:  [name], DOB [birth year], MRN [id number]  Patient Location: Home Provider Location:  Office   PCP:  Cassandria Anger, MD  Cardiologist:   Johnsie Cancel Electrophysiologist:  None   Evaluation Performed:  Follow-Up Visit  Chief Complaint:  CAD  History of Present Illness:    73 y.o. male with a hx of CAD, paroxysmal atrial fibrillation not on systemic anticoagulation given need for DAPT, hyperlipidemia, COPD, GERD, obstructive sleep apnea, rheumatoid arthritis, and hemochromatosis. He had left circumflex stent with spiral dissection in 2006. He had TEE DCCV in May 2012. He had h/o post lumbar fusion surgical wound infection with positive blood culture of pseudomonas and required 6 weeks of IV antibiotics near the end of 2014. He was admitted in March 2016 with chest tightness and dyspnea and underwent cardiac catheterization which showed new stenosis in proximal to mid LAD treated with overlapping drug-eluting stents. He was started on aspirin and Plavix. Sees ortho at [facility] Dr. Pasty Spillers.   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    Past Medical History:  Diagnosis Date  . Alcoholism /alcohol abuse ([place])    per family  . Allergic rhinitis   . Allergy   . Anxiety   . Bacterial infection   . CAD (coronary artery disease)    minimal coronary plaque in the LAD and right coronary system. 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 7-06. Last nuclear stress 11-17-06 fixed  anterior/ inferior defect, no inducible ischemia, EF 81%  . Cataract   . CHF (congestive heart failure) ([place])   . Chronic back pain    "all over back"  . Chronic neck pain   . COPD (chronic obstructive pulmonary disease) ([place])   . Depression   . Diverticulosis   . Dyspnea    with exertion  . Dysrhythmia 01-24-12   past hx. A.Fib x1 episode-responded to med.  . Falls frequently    "since 02/2013" (06/16/2013)  . Family history of cancer   . Genetic testing 03/06/2017   Multi-Cancer panel (83 genes) @ [facility] - No pathogenic mutations detected  . GERD (gastroesophageal reflux disease)   . Hemochromatosis    dx'd 14 yrs ago last ferritin Aug 11, 08 52 (22-322), Fe 136 ("I had 250 phlebotomies for that")  . High cholesterol    hx  . Hx of colonic polyps   . Hx of colonoscopy   . Hypertension   . Iron deficiency anemia due to chronic blood loss 12/27/2016  . Malignant melanoma of knee, left ([place])   . Myocardial infarction [facility] 2006   "related to catheterization"  . Narcotic abuse ([place])    per family  . Osteoarthritis   . PONV (postoperative nausea and vomiting)   . RA (rheumatoid arthritis) ([place])    Past Surgical History:  Procedure Laterality Date  . ABDOMINAL ADHESION SURGERY  ~ 1968  . ANKLE RECONSTRUCTION Right 6-09   [facility]  . APPENDECTOMY  ~ 1956  . ASPIRATION OF  ABSCESS Left 03/27/2017   Procedure: ASPIRATION OF SEROMA;  Surgeon: Stark Klein, MD;  Location: [place];  Service: General;  Laterality: Left;  . BONE TUMOR RESECTION  ~ 1954   "taken off my mastoid"  . CARPAL TUNNEL RELEASE Right 1990's  . CATARACT EXTRACTION, BILATERAL Bilateral 01-24-12  . CORONARY ANGIOPLASTY WITH STENT PLACEMENT  2006   "while repairing 1st stent, a second area tore and they had to place 2nd stent " ?LAD & CX  . CORONARY ANGIOPLASTY WITH STENT PLACEMENT  06/17/2014  . CYST EXCISION  "several OR's"   "backX 2, back of my neck, face, inside right bicept, chest, wrist"   . FOOT SURGERY Right 11-08   for removal of bone spurs-  . HAMMER TOE SURGERY Right 07/2012   "broke 4 hammertoes"   . HARDWARE REMOVAL  03/09/2012   Procedure: HARDWARE REMOVAL;  Surgeon: Nita Sells, MD;  Location: [place];  Service: Orthopedics;  Laterality: Left;  Hardware Removal from Left Shoulder  . HARVEST BONE GRAFT  02/06/2012   Procedure: HARVEST ILIAC BONE GRAFT;  Surgeon: Nita Sells, MD;  Location: [facility];  Service: Orthopedics;;  bone marrow aspirqation   . INGUINAL HERNIA REPAIR Bilateral   . JOINT REPLACEMENT    . KNEE SURGERY Left ~ 2003   "6-12 months after uni knee removed synovial sack"  . LEFT HEART CATHETERIZATION WITH CORONARY ANGIOGRAM N/A 06/17/2014   PCI of diffuse severe stenosis in the proximal to mid LAD using overlapping drug-eluting stents.  . LUMBAR WOUND DEBRIDEMENT N/A 03/17/2013   Procedure: Incision and drainage of superficial lumbar wound;  Surgeon: Floyce Stakes, MD;  Location: [place] [facility];  Service: Neurosurgery;  Laterality: N/A;  Incision and drainage of superficial lumbar wound  . MECKEL DIVERTICULUM EXCISION  ~ 1956  . MELANOMA EXCISION WITH SENTINEL LYMPH NODE BIOPSY Left 02/12/2017   WIDE LOCAL EXCISION LEFT KNEE MELANOMA, ADVANCEMENT FLAP CLOSURE, AND SENTINEL LYMPH NODE MAPPING AND BIOPSY.  [name] MELANOMA EXCISION WITH SENTINEL LYMPH NODE BIOPSY Left 02/12/2017   Procedure: WIDE LOCAL EXCISION LEFT KNEE MELANOMA, ADVANCEMENT FLAP CLOSURE, AND SENTINEL LYMPH NODE MAPPING AND BIOPSY.;  Surgeon: Stark Klein, MD;  Location: [place];  Service: General;  Laterality: Left;  GENERAL AND LOCAL  . ORIF SHOULDER FRACTURE  02/06/2012   Procedure: OPEN REDUCTION INTERNAL FIXATION (ORIF) SHOULDER FRACTURE;  Surgeon: Nita Sells, MD;  Location: [facility];  Service: Orthopedics;  Laterality: Left;  ORIF of a Left Shoulder Fracture with  Iliac Crest Bone Graft aspiration   . PORTACATH PLACEMENT Left 03/27/2017    Procedure: INSERTION PORT-A-CATH;  Surgeon: Stark Klein, MD;  Location: [place];  Service: General;  Laterality: Left;  . POSTERIOR LUMBAR FUSION  12-10   L4-5 diskectomy w/ fusion, cage placement and rods; [place]  . POSTERIOR LUMBAR FUSION 4 LEVEL N/A 03/02/2013   Procedure: Lumbar One to Sacral One Posterior lumbar interbody fusion;  Surgeon: Floyce Stakes, MD;  Location: [place] [facility];  Service: Neurosurgery;  Laterality: N/A;  L1 to S1 Posterior lumbar interbody fusion  . REPLACEMENT UNICONDYLAR JOINT KNEE Left ~ 2003   "~ 6 months after total knee replaced"  . SHOULDER ARTHROSCOPY Left ~ 2004 X 2   "@ [facility]; left bone splinter in & had to clean it out"  . TOTAL ANKLE REPLACEMENT Right 2008   at [facility]  . TOTAL KNEE ARTHROPLASTY Bilateral 2002  . TOTAL SHOULDER REPLACEMENT Left 2006  .  TOTAL SHOULDER REPLACEMENT Right ~ 2007   Dr. Marlou Sa     Current Meds  Medication Sig  . albuterol (PROVENTIL HFA;VENTOLIN HFA) 108 (90 Base) MCG/ACT inhaler Inhale 1-2 puffs into the lungs every 4 (four) hours as needed for wheezing or shortness of breath.  . alfuzosin (UROXATRAL) 10 MG 24 hr tablet Take 10 mg by mouth at bedtime.   . ALPRAZolam (XANAX) 0.5 MG tablet Take 1 tablet (0.5 mg total) by mouth 2 (two) times daily as needed. for anxiety  . Cholecalciferol (VITAMIN D3) 2000 UNITS capsule Take 1 capsule (2,000 Units total) by mouth daily.  . clopidogrel (PLAVIX) 75 MG tablet TAKE 1 TABLET BY MOUTH EVERY DAY WITH BREAKFAST  . dronabinol (MARINOL) 5 MG capsule Take 1 capsule (5 mg total) by mouth 2 (two) times daily before a meal.  . FLUoxetine (PROZAC) 40 MG capsule TAKE 1 CAPSULE BY MOUTH TWICE DAILY  . fluticasone (FLONASE) 50 MCG/ACT nasal spray INSTILL 2 SPRAYS IN EACH NOSTRIL EVERY DAY  . gabapentin (NEURONTIN) 300 MG capsule TAKE 1 CAPSULE(300 MG) BY MOUTH TWICE DAILY  . HYDROmorphone (DILAUDID) 2 MG tablet Take 1 tablet (2 mg total) by mouth every 8 (eight) hours as  needed for severe pain.  [name] lidocaine-prilocaine (EMLA) cream Apply 1 application topically as needed.  [name] losartan-hydrochlorothiazide (HYZAAR) 50-12.5 MG tablet TAKE 1 TABLET BY MOUTH DAILY  . Melatonin 5 MG TABS Take 5 mg by mouth at bedtime. Takes 10 mg at HS  . metoprolol tartrate (LOPRESSOR) 25 MG tablet TAKE 1 TABLET (25 MG TOTAL) BY MOUTH 2 (TWO) TIMES DAILY.  [name] Omega-3 Fatty Acids (FISH OIL OMEGA-3) 1000 MG CAPS Take 1,000 mg by mouth daily.   . ondansetron (ZOFRAN) 8 MG tablet Take 1 tablet (8 mg total) by mouth 3 (three) times daily.  . pantoprazole (PROTONIX) 40 MG tablet TAKE 1 TABLET (40 MG TOTAL) BY MOUTH EVERY EVENING.  . pravastatin (PRAVACHOL) 20 MG tablet Take 1 tablet (20 mg total) by mouth daily. (Patient taking differently: Take 20 mg by mouth every evening. )  . predniSONE (DELTASONE) 5 MG tablet TAKE 2 TABLETS (10 MG TOTAL) BY MOUTH DAILY WITH BREAKFAST.  [name] prochlorperazine (COMPAZINE) 10 MG tablet TAKE 1 TABLET(10 MG) BY MOUTH EVERY 6 HOURS AS NEEDED FOR NAUSEA OR VOMITING  . Pyridoxine HCl (B-6 PO) Take 1 tablet by mouth.  . tadalafil (CIALIS) 5 MG tablet Take 5 mg by mouth as needed.  . torsemide (DEMADEX) 10 MG tablet Take 1-2 tablets (10-20 mg total) by mouth daily. (Patient taking differently: Take 10-20 mg by mouth as needed. )  . traZODone (DESYREL) 100 MG tablet 1 po at hs  . vitamin B-12 (CYANOCOBALAMIN) 1000 MCG tablet Take 1,000 mcg by mouth daily.      Allergies:   Cefepime, Cephaeline, Morphine, Morphine and related, Penicillins, Doxycycline, and Oxycodone-acetaminophen   Social History   Tobacco Use  . Smoking status: Never Smoker  . Smokeless tobacco: Never Used  Substance Use Topics  . Alcohol use: No    Alcohol/week: 0.0 standard drinks    Comment: "I do not drink anymore"  . Drug use: No     Family Hx: The patient's family history includes Cancer in his maternal grandmother; Cancer - Lung in his mother; Cancer - Prostate (age of onset: 11) in  his father; Colon polyps in his father; Coronary artery disease in his father and maternal aunt; Heart attack in his brother and maternal aunt; Hemochromatosis in his sister;  Hyperlipidemia in his brother; Hypertension in his father; Lung cancer in his mother; Macular degeneration in his mother; Melanoma in his mother; Other in his brother and mother; Prostate cancer in his father; Rheum arthritis in his sister; Thyroid disease in his sister; Uterine cancer in his mother. There is no history of Diabetes, Colon cancer, Esophageal cancer, Rectal cancer, or Stomach cancer.  ROS:   Please see the history of present illness.     All other systems reviewed and are negative.   Prior CV studies:   The following studies were reviewed today: Cath 06/17/14 LHC 06/17/14 Coronary angiography: Coronary dominance: right Left mainstem: Normal Left anterior descending (LAD): 80% proximal lesion before D1 50% just distal to D1 80% distal D1 30% multiple discrete Left circumflex (LCx): Primarily AV groove and OM1 Widely patent stents in OM Right coronary artery (RCA): Dominant 30% proximal 50% Mid vessel stenosis  Left ventriculography: Left ventricular systolic function is normal, LVEF is estimated at 55-65%, there is no significant mitral regurgitation  Final Conclusions: New LAD lesions patent stents in OM Recommendations: Reviewed with Dr Burt Knack PCI/Stent mulitple sights in LAD  Conclusions: Successful PCI of diffuse severe stenosis in the proximal to mid LAD using overlapping drug-eluting stent platforms  Recommendations: Dual antiplatelet therapy with aspirin and Plavix for at least 12 months.   Lesion Data: Vessel: LAD/proximal to mid Percent stenosis (pre): 80 TIMI-flow (pre): 3 Stent: 2.5 x 24 mm Promus DES and 3.0 x 20 mm Promus DES (overlapping) Percent stenosis (post): 0 TIMI-flow (post): 3   Myovue 2016   Labs/Other Tests and Data Reviewed:    EKG:   03/23/18 SR rate  45 old IMI no acute changes 02/24/19 SR rate 65 normal   Recent Labs: 01/11/2019: ALT 10; BUN 21; Creatinine 1.06; Hemoglobin 12.7; Platelet Count 257; Potassium 3.7; Sodium 135   Recent Lipid Panel Lab Results  Component Value Date/Time   CHOL 128 03/10/2015 10:08 AM   TRIG 95.0 03/10/2015 10:08 AM   HDL 39.30 03/10/2015 10:08 AM   CHOLHDL 3 03/10/2015 10:08 AM   LDLCALC 70 03/10/2015 10:08 AM    Wt Readings from Last 3 Encounters:  02/24/19 144 lb (65.3 kg)  01/11/19 143 lb (64.9 kg)  01/11/19 143 lb (64.9 kg)     Objective:    Vital Signs:  BP 110/78   Pulse 65   Ht 5' 6"  (1.676 m)   Wt 144 lb (65.3 kg)   SpO2 96%   BMI 23.24 kg/m    Affect appropriate Healthy:  appears stated age HEENT: normal Neck supple with no adenopathy JVP normal no bruits no thyromegaly Lungs clear with no wheezing and good diaphragmatic motion Heart:  S1/S2 no murmur, no rub, gallop or click PMI normal Abdomen: benighn, BS positve, no tenderness, no AAA no bruit.  No HSM or HJR Distal pulses intact with no bruits No edema Neuro non-focal Skin warm and dry No muscular weakness    ASSESSMENT & PLAN:    1. CAD s/p overlapping DESx2 to LAD on 06/17/2014 Previous stenting circumflex 2006 with spiral dissection : no angina, Continue plavix Since not taking ASA P2Y has been good in past   2. COPD: stable no active wheezing   3. Paroxysmal atrial fibrillation not on systemic anticoagulation given need for DAPT  s/p TEE DCCV in May 2012, previously on pradaxa, off pradaxa now given need for Plavix  4. Hyperlipidemia: continue pravachol labs with primary   5. OSA:  not able to  use CPAP at night discussed going to dentist for oral device   6. Rheumatoid arthritis continue pain meds Inactivity has not helped    7. Melanoma:  F/u oncology post chemo being observed at this point   8. Hemochromatosis :  Stable with history of phlebotomy to keep ferritin less than 100 and FESO4 Rx    COVID-19 Education: The signs and symptoms of COVID-19 were discussed with the patient and how to seek care for testing (follow up with PCP or arrange E-visit).  The importance of social distancing was discussed today.  Time:   Today, I have spent 30 minutes with the patient     Medication Adjustments/Labs and Tests Ordered: Current medicines are reviewed at length with the patient today.  Concerns regarding medicines are outlined above.   Tests Ordered:  None   Medication Changes:  None   Disposition:  Follow up in a year   Signed, Jenkins Rouge, MD  02/24/2019 9:16 AM    [place]

## 2019-02-22 ENCOUNTER — Other Ambulatory Visit: Payer: Self-pay | Admitting: Internal Medicine

## 2019-02-23 DIAGNOSIS — M25572 Pain in left ankle and joints of left foot: Secondary | ICD-10-CM | POA: Diagnosis not present

## 2019-02-23 DIAGNOSIS — R2681 Unsteadiness on feet: Secondary | ICD-10-CM | POA: Diagnosis not present

## 2019-02-23 DIAGNOSIS — M6281 Muscle weakness (generalized): Secondary | ICD-10-CM | POA: Diagnosis not present

## 2019-02-23 DIAGNOSIS — M069 Rheumatoid arthritis, unspecified: Secondary | ICD-10-CM | POA: Diagnosis not present

## 2019-02-23 DIAGNOSIS — M19072 Primary osteoarthritis, left ankle and foot: Secondary | ICD-10-CM | POA: Diagnosis not present

## 2019-02-24 ENCOUNTER — Encounter: Payer: Self-pay | Admitting: Cardiovascular Disease

## 2019-02-24 ENCOUNTER — Ambulatory Visit (INDEPENDENT_AMBULATORY_CARE_PROVIDER_SITE_OTHER): Payer: Medicare Other | Admitting: Cardiovascular Disease

## 2019-02-24 ENCOUNTER — Other Ambulatory Visit: Payer: Self-pay

## 2019-02-24 VITALS — BP 110/78 | HR 65 | Ht 66.0 in | Wt 144.0 lb

## 2019-02-24 DIAGNOSIS — I2510 Atherosclerotic heart disease of native coronary artery without angina pectoris: Secondary | ICD-10-CM | POA: Diagnosis not present

## 2019-02-24 NOTE — Patient Instructions (Addendum)
Medication Instructions:   *If you need a refill on your cardiac medications before your next appointment, please call your pharmacy*  Lab Work:  If you have labs (blood work) drawn today and your tests are completely normal, you will receive your results only by: [name] MyChart Message (if you have MyChart) OR . A paper copy in the mail If you have any lab test that is abnormal or we need to change your treatment, we will call you to review the results.  Testing/Procedures: None ordered today.  Follow-Up: At [facility], you and your health needs are our priority.  As part of our continuing mission to provide you with exceptional heart care, we have created designated Provider Care Teams.  These Care Teams include your primary Cardiologist (physician) and Advanced Practice Providers (APPs -  Physician Assistants and Nurse Practitioners) who all work together to provide you with the care you need, when you need it.  Your next appointment:   12 months  The format for your next appointment:   In Person  Provider:   You may see John Rouge, MD or one of the following Advanced Practice Providers on your designated Care Team:    Truitt Merle, NP  Cecilie Kicks, NP  Kathyrn Drown, NP

## 2019-02-25 DIAGNOSIS — R2681 Unsteadiness on feet: Secondary | ICD-10-CM | POA: Diagnosis not present

## 2019-02-25 DIAGNOSIS — M25572 Pain in left ankle and joints of left foot: Secondary | ICD-10-CM | POA: Diagnosis not present

## 2019-02-25 DIAGNOSIS — M6281 Muscle weakness (generalized): Secondary | ICD-10-CM | POA: Diagnosis not present

## 2019-02-25 DIAGNOSIS — M069 Rheumatoid arthritis, unspecified: Secondary | ICD-10-CM | POA: Diagnosis not present

## 2019-02-25 DIAGNOSIS — M19072 Primary osteoarthritis, left ankle and foot: Secondary | ICD-10-CM | POA: Diagnosis not present

## 2019-03-02 DIAGNOSIS — M069 Rheumatoid arthritis, unspecified: Secondary | ICD-10-CM | POA: Diagnosis not present

## 2019-03-02 DIAGNOSIS — M25572 Pain in left ankle and joints of left foot: Secondary | ICD-10-CM | POA: Diagnosis not present

## 2019-03-02 DIAGNOSIS — M19072 Primary osteoarthritis, left ankle and foot: Secondary | ICD-10-CM | POA: Diagnosis not present

## 2019-03-02 DIAGNOSIS — R2681 Unsteadiness on feet: Secondary | ICD-10-CM | POA: Diagnosis not present

## 2019-03-02 DIAGNOSIS — M6281 Muscle weakness (generalized): Secondary | ICD-10-CM | POA: Diagnosis not present

## 2019-03-05 DIAGNOSIS — R2681 Unsteadiness on feet: Secondary | ICD-10-CM | POA: Diagnosis not present

## 2019-03-05 DIAGNOSIS — M6281 Muscle weakness (generalized): Secondary | ICD-10-CM | POA: Diagnosis not present

## 2019-03-05 DIAGNOSIS — M25572 Pain in left ankle and joints of left foot: Secondary | ICD-10-CM | POA: Diagnosis not present

## 2019-03-05 DIAGNOSIS — M069 Rheumatoid arthritis, unspecified: Secondary | ICD-10-CM | POA: Diagnosis not present

## 2019-03-05 DIAGNOSIS — M19072 Primary osteoarthritis, left ankle and foot: Secondary | ICD-10-CM | POA: Diagnosis not present

## 2019-03-08 ENCOUNTER — Inpatient Hospital Stay: Payer: Medicare Other | Attending: Hematology & Oncology

## 2019-03-08 ENCOUNTER — Other Ambulatory Visit: Payer: Self-pay

## 2019-03-08 VITALS — BP 147/70 | HR 59 | Temp 97.3°F | Resp 17

## 2019-03-08 DIAGNOSIS — Z85820 Personal history of malignant melanoma of skin: Secondary | ICD-10-CM | POA: Diagnosis not present

## 2019-03-08 DIAGNOSIS — Z452 Encounter for adjustment and management of vascular access device: Secondary | ICD-10-CM | POA: Diagnosis not present

## 2019-03-08 DIAGNOSIS — C4372 Malignant melanoma of left lower limb, including hip: Secondary | ICD-10-CM

## 2019-03-08 MED ORDER — SODIUM CHLORIDE 0.9% FLUSH
10.0000 mL | Freq: Once | INTRAVENOUS | Status: AC
  Administered 2019-03-08: 10 mL
  Filled 2019-03-08: qty 10

## 2019-03-08 MED ORDER — HEPARIN SOD (PORK) LOCK FLUSH 100 UNIT/ML IV SOLN
500.0000 [IU] | Freq: Once | INTRAVENOUS | Status: AC
  Administered 2019-03-08: 11:00:00 500 [IU] via INTRAVENOUS
  Filled 2019-03-08: qty 5

## 2019-03-08 NOTE — Patient Instructions (Signed)

## 2019-03-17 ENCOUNTER — Other Ambulatory Visit: Payer: Self-pay

## 2019-03-17 ENCOUNTER — Ambulatory Visit (INDEPENDENT_AMBULATORY_CARE_PROVIDER_SITE_OTHER): Payer: Medicare Other | Admitting: Internal Medicine

## 2019-03-17 ENCOUNTER — Encounter: Payer: Self-pay | Admitting: Internal Medicine

## 2019-03-17 VITALS — BP 132/76 | HR 56 | Temp 97.9°F | Ht 66.0 in | Wt 146.0 lb

## 2019-03-17 DIAGNOSIS — I2510 Atherosclerotic heart disease of native coronary artery without angina pectoris: Secondary | ICD-10-CM | POA: Diagnosis not present

## 2019-03-17 DIAGNOSIS — C4372 Malignant melanoma of left lower limb, including hip: Secondary | ICD-10-CM | POA: Diagnosis not present

## 2019-03-17 DIAGNOSIS — G894 Chronic pain syndrome: Secondary | ICD-10-CM

## 2019-03-17 DIAGNOSIS — M05711 Rheumatoid arthritis with rheumatoid factor of right shoulder without organ or systems involvement: Secondary | ICD-10-CM | POA: Diagnosis not present

## 2019-03-17 DIAGNOSIS — F419 Anxiety disorder, unspecified: Secondary | ICD-10-CM

## 2019-03-17 DIAGNOSIS — I10 Essential (primary) hypertension: Secondary | ICD-10-CM | POA: Diagnosis not present

## 2019-03-17 MED ORDER — HYDROMORPHONE HCL 2 MG PO TABS: 2.0000 mg | ORAL_TABLET | Freq: Three times a day (TID) | ORAL | 0 refills | Status: DC | PRN

## 2019-03-17 MED ORDER — ALPRAZOLAM 0.5 MG PO TABS: 0.5000 mg | ORAL_TABLET | Freq: Two times a day (BID) | ORAL | 3 refills | Status: DC | PRN

## 2019-03-17 NOTE — Assessment & Plan Note (Signed)
Xanax prn  Potential benefits of a long term benzodiazepines  use as well as potential risks  and complications were explained to the patient and were aknowledged. Not to take w/dilaudid

## 2019-03-17 NOTE — Assessment & Plan Note (Signed)
F/u q 3 months

## 2019-03-17 NOTE — Assessment & Plan Note (Signed)
Lopressor and Losartan HCT

## 2019-03-17 NOTE — Assessment & Plan Note (Signed)
Dilaudid prn  Potential benefits of a long term opioids use as well as potential risks (i.e. addiction risk, apnea etc) and complications (i.e. Somnolence, constipation and others) were explained to the patient and were aknowledged.

## 2019-03-17 NOTE — Progress Notes (Signed)
Subjective:  Patient ID: [name], male    DOB: 05/05/1945  Age: 73 y.o. MRN: [id number]  CC: No chief complaint on file.   HPI [name] presents for chronic pain, RA, HTN f/u  Outpatient Medications Prior to Visit  Medication Sig Dispense Refill  . albuterol (PROVENTIL HFA;VENTOLIN HFA) 108 (90 Base) MCG/ACT inhaler Inhale 1-2 puffs into the lungs every 4 (four) hours as needed for wheezing or shortness of breath. 1 Inhaler 1  . alfuzosin (UROXATRAL) 10 MG 24 hr tablet Take 10 mg by mouth at bedtime.   0  . ALPRAZolam (XANAX) 0.5 MG tablet Take 1 tablet (0.5 mg total) by mouth 2 (two) times daily as needed. for anxiety 60 tablet 3  . Cholecalciferol (VITAMIN D3) 2000 UNITS capsule Take 1 capsule (2,000 Units total) by mouth daily. 100 capsule 3  . clopidogrel (PLAVIX) 75 MG tablet TAKE 1 TABLET BY MOUTH EVERY DAY WITH BREAKFAST 90 tablet 3  . dronabinol (MARINOL) 5 MG capsule Take 1 capsule (5 mg total) by mouth 2 (two) times daily before a meal. 60 capsule 2  . FLUoxetine (PROZAC) 40 MG capsule TAKE 1 CAPSULE BY MOUTH TWICE DAILY 180 capsule 3  . fluticasone (FLONASE) 50 MCG/ACT nasal spray INSTILL 2 SPRAYS IN EACH NOSTRIL EVERY DAY 16 g 11  . gabapentin (NEURONTIN) 300 MG capsule TAKE 1 CAPSULE(300 MG) BY MOUTH TWICE DAILY 180 capsule 1  . HYDROmorphone (DILAUDID) 2 MG tablet Take 1 tablet (2 mg total) by mouth every 8 (eight) hours as needed for severe pain. 90 tablet 0  . lidocaine-prilocaine (EMLA) cream Apply 1 application topically as needed. 30 g 5  . losartan-hydrochlorothiazide (HYZAAR) 50-12.5 MG tablet TAKE 1 TABLET BY MOUTH DAILY 90 tablet 3  . Melatonin 5 MG TABS Take 5 mg by mouth at bedtime. Takes 10 mg at HS    . metoprolol tartrate (LOPRESSOR) 25 MG tablet TAKE 1 TABLET (25 MG TOTAL) BY MOUTH 2 (TWO) TIMES DAILY. 180 tablet 1  . Omega-3 Fatty Acids (FISH OIL OMEGA-3) 1000 MG CAPS Take 1,000 mg by mouth daily.     . ondansetron (ZOFRAN) 8 MG tablet  Take 1 tablet (8 mg total) by mouth 3 (three) times daily. 90 tablet 3  . pantoprazole (PROTONIX) 40 MG tablet TAKE 1 TABLET (40 MG TOTAL) BY MOUTH EVERY EVENING. 90 tablet 1  . pravastatin (PRAVACHOL) 20 MG tablet Take 1 tablet (20 mg total) by mouth daily. (Patient taking differently: Take 20 mg by mouth every evening. ) 90 tablet 1  . predniSONE (DELTASONE) 5 MG tablet TAKE 2 TABLETS (10 MG TOTAL) BY MOUTH DAILY WITH BREAKFAST. 180 tablet 1  . prochlorperazine (COMPAZINE) 10 MG tablet TAKE 1 TABLET(10 MG) BY MOUTH EVERY 6 HOURS AS NEEDED FOR NAUSEA OR VOMITING 385 tablet 1  . Pyridoxine HCl (B-6 PO) Take 1 tablet by mouth.    . tadalafil (CIALIS) 5 MG tablet Take 5 mg by mouth as needed.    . torsemide (DEMADEX) 10 MG tablet Take 1-2 tablets (10-20 mg total) by mouth daily. (Patient taking differently: Take 10-20 mg by mouth as needed. ) 60 tablet 3  . traZODone (DESYREL) 100 MG tablet 1 po at hs 90 tablet 3  . vitamin B-12 (CYANOCOBALAMIN) 1000 MCG tablet Take 1,000 mcg by mouth daily.      No facility-administered medications prior to visit.     ROS: Review of Systems  Constitutional: Positive for fatigue. Negative for appetite change  and unexpected weight change.  HENT: Negative for congestion, nosebleeds, sneezing, sore throat and trouble swallowing.   Eyes: Negative for itching and visual disturbance.  Respiratory: Negative for cough.   Cardiovascular: Negative for chest pain, palpitations and leg swelling.  Gastrointestinal: Negative for abdominal distention, blood in stool, diarrhea and nausea.  Genitourinary: Negative for frequency and hematuria.  Musculoskeletal: Positive for arthralgias, back pain and gait problem. Negative for joint swelling and neck pain.  Skin: Negative for rash.  Neurological: Negative for dizziness, tremors, speech difficulty and weakness.  Psychiatric/Behavioral: Positive for decreased concentration. Negative for agitation, dysphoric mood, sleep  disturbance and suicidal ideas. The patient is nervous/anxious.     Objective:  BP 132/76 (BP Location: Left Arm, Patient Position: Sitting, Cuff Size: Normal)   Pulse (!) 56   Temp 97.9 F (36.6 C) (Oral)   Ht 5' 6"  (1.676 m)   Wt 146 lb (66.2 kg)   SpO2 93%   BMI 23.57 kg/m   BP Readings from Last 3 Encounters:  03/17/19 132/76  03/08/19 (!) 147/70  02/24/19 110/78    Wt Readings from Last 3 Encounters:  03/17/19 146 lb (66.2 kg)  02/24/19 144 lb (65.3 kg)  01/11/19 143 lb (64.9 kg)    Physical Exam Constitutional:      General: He is not in acute distress.    Appearance: He is well-developed.     Comments: NAD  Eyes:     Conjunctiva/sclera: Conjunctivae normal.     Pupils: Pupils are equal, round, and reactive to light.  Neck:     Musculoskeletal: Normal range of motion.     Thyroid: No thyromegaly.     Vascular: No JVD.  Cardiovascular:     Rate and Rhythm: Normal rate and regular rhythm.     Heart sounds: Normal heart sounds. No murmur. No friction rub. No gallop.   Pulmonary:     Effort: Pulmonary effort is normal. No respiratory distress.     Breath sounds: Normal breath sounds. No wheezing or rales.  Chest:     Chest wall: No tenderness.  Abdominal:     General: Bowel sounds are normal. There is no distension.     Palpations: Abdomen is soft. There is no mass.     Tenderness: There is no abdominal tenderness. There is no guarding or rebound.  Musculoskeletal: Normal range of motion.        General: Tenderness and deformity present.  Lymphadenopathy:     Cervical: No cervical adenopathy.  Skin:    General: Skin is warm and dry.     Findings: No rash.  Neurological:     Mental Status: He is alert and oriented to person, place, and time.     Cranial Nerves: No cranial nerve deficit.     Motor: No abnormal muscle tone.     Coordination: Coordination normal.     Gait: Gait normal.     Deep Tendon Reflexes: Reflexes are normal and symmetric.   Psychiatric:        Behavior: Behavior normal.        Thought Content: Thought content normal.        Judgment: Judgment normal.     Lab Results  Component Value Date   WBC 7.8 01/11/2019   HGB 12.7 (L) 01/11/2019   HCT 38.4 (L) 01/11/2019   PLT 257 01/11/2019   GLUCOSE 104 (H) 01/11/2019   CHOL 128 03/10/2015   TRIG 95.0 03/10/2015   HDL 39.30 03/10/2015  LDLCALC 70 03/10/2015   ALT 10 01/11/2019   AST 11 (L) 01/11/2019   NA 135 01/11/2019   K 3.7 01/11/2019   CL 98 01/11/2019   CREATININE 1.06 01/11/2019   BUN 21 01/11/2019   CO2 30 01/11/2019   TSH 0.680 09/18/2017   PSA 1.95 10/15/2010   INR 1.1 (H) 06/14/2014   HGBA1C 6.0 09/28/2015    Nm Pet Image Restage (ps) Whole Body  Result Date: 07/14/2018 CLINICAL DATA:  Subsequent treatment strategy for restaging of melanoma. EXAM: NUCLEAR MEDICINE PET WHOLE BODY TECHNIQUE: 6.8 mCi F-18 FDG was injected intravenously. Full-ring PET imaging was performed from the skull base to thigh after the radiotracer. CT data was obtained and used for attenuation correction and anatomic localization. Fasting blood glucose: 81 mg/dl COMPARISON:  04/03/2018.  Clinic note of 06/11/2018 FINDINGS: Mediastinal blood pool activity: SUV max 1.8 HEAD/NECK: No abnormal intracranial or cervical nodal hypermetabolism. Incidental CT findings: Bilateral carotid atherosclerosis. No cervical adenopathy. CHEST: Development of relatively mild bilateral, upper lobe predominant hypermetabolic ground-glass opacities. Example in the right upper lobe at on the order of 2.8 cm and a S.U.V. max of 6.3 on image 26/8. Within the anterior left upper lobe at a S.U.V. max of 4.4 on image 33/8. No thoracic nodal hypermetabolism. Incidental CT findings: Right hemidiaphragm elevation. Mild cardiomegaly with aortic and coronary artery atherosclerosis. Left Port-A-Cath tip at mid SVC. Pulmonary artery enlargement, outflow tract 4.0 cm. Beam hardening artifact from bilateral  shoulder arthroplasty. ABDOMEN/PELVIS: No abdominopelvic nodal or parenchymal hypermetabolism identified. Incidental CT findings: Beam hardening artifact from lumbar spine fixation. Grossly normal adrenal glands. Mild renal cortical thinning bilaterally. Low-density bilateral renal lesions are difficult to directly quantify secondary to beam hardening artifact from adjacent lumbar spine hardware. Likely cysts based on morphology. Grossly similar to on the prior. Abdominal aortic atherosclerosis. Mild prostatomegaly. Right larger than left hydroceles. SKELETON: No abnormal marrow activity. Incidental CT findings: none EXTREMITIES: No abnormal activity within the extremities. 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 Surgical change about both feet and the right ankle. IMPRESSION: 1. No findings of residual or recurrent hypermetabolic melanoma. 2. Development of bilateral hypermetabolic upper lobe predominant ground-glass opacities. Favor drug toxicity. Given absence of fever or shortness of breath on prior clinic note of 06/11/2018, atypical or viral infectious process is felt less likely. 3. Multifactorial degradation as detailed above. 4. Coronary artery atherosclerosis. Aortic Atherosclerosis (ICD10-I70.0). 5. Pulmonary artery enlargement suggests pulmonary arterial hypertension. These results will be called to the ordering clinician or representative by the Radiologist Assistant, and communication documented in the PACS or zVision Dashboard. Electronically Signed   By: Abigail Miyamoto M.D.   On: 07/14/2018 16:54    Assessment & Plan:   There are no diagnoses linked to this encounter.   No orders of the defined types were placed in this encounter.    Follow-up: No follow-ups on file.  [name], MD

## 2019-03-17 NOTE — Assessment & Plan Note (Signed)
Predn 5 mg/d Better on Dilaudid Chronic pain  Potential benefits of a long term opioids use as well as potential risks (i.e. addiction risk, apnea etc) and complications (i.e. Somnolence, constipation and others) were explained to the patient and were aknowledged. ?trekking poles for walking

## 2019-03-19 ENCOUNTER — Other Ambulatory Visit: Payer: Self-pay | Admitting: Internal Medicine

## 2019-04-12 DIAGNOSIS — Z20828 Contact with and (suspected) exposure to other viral communicable diseases: Secondary | ICD-10-CM | POA: Diagnosis not present

## 2019-04-12 DIAGNOSIS — Z1159 Encounter for screening for other viral diseases: Secondary | ICD-10-CM | POA: Diagnosis not present

## 2019-04-15 DIAGNOSIS — Z20828 Contact with and (suspected) exposure to other viral communicable diseases: Secondary | ICD-10-CM | POA: Diagnosis not present

## 2019-04-15 DIAGNOSIS — Z1159 Encounter for screening for other viral diseases: Secondary | ICD-10-CM | POA: Diagnosis not present

## 2019-04-19 ENCOUNTER — Encounter: Payer: Self-pay | Admitting: Hematology & Oncology

## 2019-04-19 ENCOUNTER — Telehealth: Payer: Self-pay | Admitting: Hematology & Oncology

## 2019-04-19 ENCOUNTER — Inpatient Hospital Stay: Payer: Medicare Other

## 2019-04-19 ENCOUNTER — Inpatient Hospital Stay: Payer: Medicare Other | Attending: Hematology & Oncology | Admitting: Hematology & Oncology

## 2019-04-19 ENCOUNTER — Other Ambulatory Visit: Payer: Self-pay

## 2019-04-19 VITALS — BP 118/88 | HR 55 | Temp 97.1°F | Resp 17 | Wt 147.0 lb

## 2019-04-19 DIAGNOSIS — Z79899 Other long term (current) drug therapy: Secondary | ICD-10-CM | POA: Insufficient documentation

## 2019-04-19 DIAGNOSIS — Z95828 Presence of other vascular implants and grafts: Secondary | ICD-10-CM

## 2019-04-19 DIAGNOSIS — Z9221 Personal history of antineoplastic chemotherapy: Secondary | ICD-10-CM | POA: Diagnosis not present

## 2019-04-19 DIAGNOSIS — C4372 Malignant melanoma of left lower limb, including hip: Secondary | ICD-10-CM | POA: Diagnosis not present

## 2019-04-19 DIAGNOSIS — D509 Iron deficiency anemia, unspecified: Secondary | ICD-10-CM | POA: Insufficient documentation

## 2019-04-19 DIAGNOSIS — D500 Iron deficiency anemia secondary to blood loss (chronic): Secondary | ICD-10-CM

## 2019-04-19 DIAGNOSIS — Z85820 Personal history of malignant melanoma of skin: Secondary | ICD-10-CM | POA: Diagnosis not present

## 2019-04-19 DIAGNOSIS — Z452 Encounter for adjustment and management of vascular access device: Secondary | ICD-10-CM | POA: Insufficient documentation

## 2019-04-19 DIAGNOSIS — Z1159 Encounter for screening for other viral diseases: Secondary | ICD-10-CM | POA: Diagnosis not present

## 2019-04-19 DIAGNOSIS — Z20828 Contact with and (suspected) exposure to other viral communicable diseases: Secondary | ICD-10-CM | POA: Diagnosis not present

## 2019-04-19 LAB — CMP (CANCER CENTER ONLY)
ALT: 11 U/L (ref 0–44)
AST: 11 U/L — ABNORMAL LOW (ref 15–41)
Albumin: 3.8 g/dL (ref 3.5–5.0)
Alkaline Phosphatase: 55 U/L (ref 38–126)
Anion gap: 7 (ref 5–15)
BUN: 33 mg/dL — ABNORMAL HIGH (ref 8–23)
CO2: 30 mmol/L (ref 22–32)
Calcium: 9.1 mg/dL (ref 8.9–10.3)
Chloride: 100 mmol/L (ref 98–111)
Creatinine: 1.18 mg/dL (ref 0.61–1.24)
GFR, Est AFR Am: >60 mL/min (ref >60)
GFR, Estimated: >60 mL/min (ref >60)
Glucose, Bld: 93 mg/dL (ref 70–99)
Potassium: 3.8 mmol/L (ref 3.5–5.1)
Sodium: 137 mmol/L (ref 135–145)
Total Bilirubin: 0.5 mg/dL (ref 0.3–1.2)
Total Protein: 5.9 g/dL — ABNORMAL LOW (ref 6.5–8.1)

## 2019-04-19 LAB — CBC WITH DIFFERENTIAL (CANCER CENTER ONLY)
Abs Immature Granulocytes: 0.02 10*3/uL (ref 0.00–0.07)
Basophils Absolute: 0 10*3/uL (ref 0.0–0.1)
Basophils Relative: 1 %
Eosinophils Absolute: 0 10*3/uL (ref 0.0–0.5)
Eosinophils Relative: 0 %
HCT: 38.4 % — ABNORMAL LOW (ref 39.0–52.0)
Hemoglobin: 12.8 g/dL — ABNORMAL LOW (ref 13.0–17.0)
Immature Granulocytes: 0 %
Lymphocytes Relative: 34 %
Lymphs Abs: 2.1 10*3/uL (ref 0.7–4.0)
MCH: 31.7 pg (ref 26.0–34.0)
MCHC: 33.3 g/dL (ref 30.0–36.0)
MCV: 95 fL (ref 80.0–100.0)
Monocytes Absolute: 0.6 10*3/uL (ref 0.1–1.0)
Monocytes Relative: 10 %
Neutro Abs: 3.6 10*3/uL (ref 1.7–7.7)
Neutrophils Relative %: 55 %
Platelet Count: 206 10*3/uL (ref 150–400)
RBC: 4.04 MIL/uL — ABNORMAL LOW (ref 4.22–5.81)
RDW: 12.5 % (ref 11.5–15.5)
WBC Count: 6.4 10*3/uL (ref 4.0–10.5)
nRBC: 0 % (ref 0.0–0.2)

## 2019-04-19 LAB — LACTATE DEHYDROGENASE: LDH: 173 U/L (ref 98–192)

## 2019-04-19 MED ORDER — HEPARIN SOD (PORK) LOCK FLUSH 100 UNIT/ML IV SOLN
500.0000 [IU] | Freq: Once | INTRAVENOUS | Status: AC
  Administered 2019-04-19: 500 [IU] via INTRAVENOUS
  Filled 2019-04-19: qty 5

## 2019-04-19 MED ORDER — SODIUM CHLORIDE 0.9% FLUSH
10.0000 mL | Freq: Once | INTRAVENOUS | Status: AC
  Administered 2019-04-19: 10 mL via INTRAVENOUS
  Filled 2019-04-19: qty 10

## 2019-04-19 NOTE — Telephone Encounter (Signed)
Appointments scheduled and a calendar was printed per 1/11 los

## 2019-04-19 NOTE — Progress Notes (Signed)
Hematology and Oncology Follow Up Visit  [name] [id number] [birth year] 74 y.o. 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  I would say that he looks fantastic.  We last saw him back in October.  He and his wife are doing quite well.  They are in assisted living.  He seems to enjoy this.  He is exercising.  He has not had diarrhea.  He is eating well.  He is gaining a little bit of weight which is nice to see.  He and his family had a nice quiet holiday season.  With respect to this hemochromatosis, his iron studies back in October showed ferritin 100 with an iron saturation of 59%.  He was phlebotomized.  He has had no headache.  He has had no leg swelling.  He does have arthritis which she is bothered by.    Overall, his performance status is ECOG 1.  Medications:  Allergies as of 04/19/2019      Reactions   Cefepime Hives, Shortness Of Breath   Cephaeline Hives   Morphine Swelling   A swollen stomach.   Morphine And Related Shortness Of Breath, Nausea And Vomiting, Swelling, Other (See Comments)   Agitation, tolerates dilaudid Other reaction(s): Other (See Comments) Agitation, tolerates dilaudid   Penicillins Hives, Shortness Of Breath, Rash   Has patient had a PCN reaction causing immediate rash, facial/tongue/throat swelling, SOB or lightheadedness with hypotension: Yes Has patient had a PCN reaction causing severe rash involving mucus membranes or skin necrosis: Yes Has patient had a PCN reaction that required hospitalization No Has patient had a PCN reaction occurring  within the last 10 years: No If all of the above answers are "NO", then may proceed with Cephalosporin use.   Doxycycline Rash   Oxycodone-acetaminophen Other (See Comments)   Patient doesn't remember what type of reaction.      Medication List       Accurate as of April 19, 2019  4:43 PM. If you have any questions, ask your nurse or doctor.        albuterol 108 (90 Base) MCG/ACT inhaler Commonly known as: VENTOLIN HFA Inhale 1-2 puffs into the lungs every 4 (four) hours as needed for wheezing or shortness of breath.   alfuzosin 10 MG 24 hr tablet Commonly known as: UROXATRAL Take 10 mg by mouth at bedtime.   ALPRAZolam 0.5 MG tablet Commonly known as: XANAX Take 1 tablet (0.5 mg total) by mouth 2 (two) times daily as needed. for anxiety   B-6 PO Take 1 tablet by mouth.   Cialis 5 MG tablet Generic drug: tadalafil Take 5 mg by mouth as needed.   clopidogrel 75 MG tablet Commonly known as: PLAVIX TAKE 1 TABLET BY MOUTH EVERY DAY WITH BREAKFAST   dronabinol 5 MG capsule Commonly known as: MARINOL Take 1 capsule (5 mg total) by mouth 2 (two) times daily before a meal.   Fish Oil Omega-3 1000 MG Caps Take 1,000 mg by mouth daily.   FLUoxetine 40 MG capsule Commonly known as: PROZAC TAKE 1 CAPSULE BY MOUTH TWICE DAILY   fluticasone 50 MCG/ACT nasal spray Commonly  known as: FLONASE INSTILL 2 SPRAYS IN EACH NOSTRIL EVERY DAY   gabapentin 300 MG capsule Commonly known as: NEURONTIN TAKE 1 CAPSULE(300 MG) BY MOUTH TWICE DAILY   HYDROmorphone 2 MG tablet Commonly known as: DILAUDID Take 1 tablet (2 mg total) by mouth every 8 (eight) hours as needed for severe pain.   lidocaine-prilocaine cream Commonly known as: EMLA Apply 1 application topically as needed.   losartan-hydrochlorothiazide 50-12.5 MG tablet Commonly known as: HYZAAR TAKE 1 TABLET BY MOUTH DAILY   Melatonin 5 MG Tabs Take 5 mg by mouth at bedtime. Takes 10 mg at HS   metoprolol tartrate 25  MG tablet Commonly known as: LOPRESSOR TAKE 1 TABLET (25 MG TOTAL) BY MOUTH 2 (TWO) TIMES DAILY.   ondansetron 8 MG tablet Commonly known as: ZOFRAN Take 1 tablet (8 mg total) by mouth 3 (three) times daily.   pantoprazole 40 MG tablet Commonly known as: PROTONIX TAKE 1 TABLET (40 MG TOTAL) BY MOUTH EVERY EVENING.   pravastatin 20 MG tablet Commonly known as: PRAVACHOL Take 1 tablet (20 mg total) by mouth daily. What changed: when to take this   predniSONE 5 MG tablet Commonly known as: DELTASONE TAKE 2 TABLETS (10 MG TOTAL) BY MOUTH DAILY WITH BREAKFAST.   prochlorperazine 10 MG tablet Commonly known as: COMPAZINE TAKE 1 TABLET(10 MG) BY MOUTH EVERY 6 HOURS AS NEEDED FOR NAUSEA OR VOMITING   torsemide 10 MG tablet Commonly known as: Demadex Take 1-2 tablets (10-20 mg total) by mouth daily. What changed:   when to take this  reasons to take this   traZODone 100 MG tablet Commonly known as: DESYREL 1 po at hs   vitamin B-12 1000 MCG tablet Commonly known as: CYANOCOBALAMIN Take 1,000 mcg by mouth daily.   Vitamin D3 50 MCG (2000 UT) capsule Take 1 capsule (2,000 Units total) by mouth daily.       Allergies:  Allergies  Allergen Reactions  . Cefepime Hives and Shortness Of Breath  . Cephaeline Hives  . Morphine Swelling    A swollen stomach.  . Morphine And Related Shortness Of Breath, Nausea And Vomiting, Swelling and Other (See Comments)    Agitation, tolerates dilaudid Other reaction(s): Other (See Comments) Agitation, tolerates dilaudid  . Penicillins Hives, Shortness Of Breath and Rash    Has patient had a PCN reaction causing immediate rash, facial/tongue/throat swelling, SOB or lightheadedness with hypotension: Yes Has patient had a PCN reaction causing severe rash involving mucus membranes or skin necrosis: Yes Has patient had a PCN reaction that required hospitalization No Has patient had a PCN reaction occurring within the last 10 years: No If  all of the above answers are "NO", then may proceed with Cephalosporin use.   [name] Doxycycline Rash  . Oxycodone-Acetaminophen Other (See Comments)    Patient doesn't remember what type of reaction.    Past Medical History, Surgical history, Social history, and Family History were reviewed and updated.  Review of Systems: Review of Systems  Constitutional: Negative.   HENT: Negative.   Eyes: Negative.   Respiratory: Negative.   Cardiovascular: Negative.   Gastrointestinal: Negative.   Genitourinary: Negative.   Musculoskeletal: Negative.   Skin: Negative.   Neurological: Negative.   Endo/Heme/Allergies: Negative.   Psychiatric/Behavioral: Negative.     Physical Exam:  weight is 147 lb (66.7 kg). His temporal temperature is 97.1 F (36.2 C) (abnormal). His blood pressure is 118/88 and his pulse is 55 (abnormal). His respiration is 17 and oxygen  saturation is 95%.   Wt Readings from Last 3 Encounters:  04/19/19 147 lb (66.7 kg)  03/17/19 146 lb (66.2 kg)  02/24/19 144 lb (65.3 kg)   Physical Exam Vitals reviewed.  HENT:     Head: Normocephalic and atraumatic.  Eyes:     Pupils: Pupils are equal, round, and reactive to light.  Cardiovascular:     Rate and Rhythm: Normal rate and regular rhythm.     Heart sounds: Normal heart sounds.  Pulmonary:     Effort: Pulmonary effort is normal.     Breath sounds: Normal breath sounds.  Abdominal:     General: Bowel sounds are normal.     Palpations: Abdomen is soft.  Musculoskeletal:        General: No tenderness or deformity. Normal range of motion.     Cervical back: Normal range of motion.  Lymphadenopathy:     Cervical: No cervical adenopathy.  Skin:    General: Skin is warm and dry.     Findings: No erythema or rash.  Neurological:     Mental Status: He is alert and oriented to person, place, and time.  Psychiatric:        Behavior: Behavior normal.        Thought Content: Thought content normal.        Judgment:  Judgment normal.      Lab Results  Component Value Date   WBC 6.4 04/19/2019   HGB 12.8 (L) 04/19/2019   HCT 38.4 (L) 04/19/2019   MCV 95.0 04/19/2019   PLT 206 04/19/2019   Lab Results  Component Value Date   FERRITIN 100 01/11/2019   IRON 103 01/11/2019   TIBC 175 (L) 01/11/2019   UIBC 72 (L) 01/11/2019   IRONPCTSAT 59 (H) 01/11/2019   Lab Results  Component Value Date   RETICCTPCT 2.1 06/11/2018   RBC 4.04 (L) 04/19/2019   No results found for: KPAFRELGTCHN, LAMBDASER, KAPLAMBRATIO No results found for: IGGSERUM, IGA, IGMSERUM No results found for: Odetta Pink, SPEI   Chemistry      Component Value Date/Time   NA 137 04/19/2019 1018   NA 140 04/10/2017 1046   NA 135 (L) 03/06/2017 1104   K 3.8 04/19/2019 1018   K 3.7 04/10/2017 1046   K 4.2 03/06/2017 1104   CL 100 04/19/2019 1018   CL 98 04/10/2017 1046   CO2 30 04/19/2019 1018   CO2 31 04/10/2017 1046   CO2 30 (H) 03/06/2017 1104   BUN 33 (H) 04/19/2019 1018   BUN 21 04/10/2017 1046   BUN 20.0 03/06/2017 1104   CREATININE 1.18 04/19/2019 1018   CREATININE 0.9 04/10/2017 1046   CREATININE 1.0 03/06/2017 1104      Component Value Date/Time   CALCIUM 9.1 04/19/2019 1018   CALCIUM 8.9 04/10/2017 1046   CALCIUM 9.4 03/06/2017 1104   ALKPHOS 55 04/19/2019 1018   ALKPHOS 65 04/10/2017 1046   ALKPHOS 78 03/06/2017 1104   AST 11 (L) 04/19/2019 1018   AST 20 03/06/2017 1104   ALT 11 04/19/2019 1018   ALT 21 04/10/2017 1046   ALT 20 03/06/2017 1104   BILITOT 0.5 04/19/2019 1018   BILITOT 0.47 03/06/2017 1104       Impression and Plan: Mr. Lobos is a very pleasant 74 yo caucasian gentleman with history of hemochromatosis, homozygous for the C282Y mutation which has not been a problem for him. We will see what his iron  studies look like today. He also has history of stage IIIC nodular melanoma of the left thigh. He completed treatment with Opdivo  in October 2019.  I am glad that he is doing so well.  I am glad that his quality of life is what he wants it to be.  I know that the melanoma can always come back.  So far, we have not found that this is the case.  I do not see any issue or any reason to do scans on him right now.  We will plan to get him back in the springtime now.  Para he does have a Port-A-Cath that we have to flush.  [name], MD 1/11/20214:43 PM

## 2019-04-19 NOTE — Patient Instructions (Signed)

## 2019-04-20 ENCOUNTER — Telehealth: Payer: Self-pay | Admitting: Hematology & Oncology

## 2019-04-20 LAB — IRON AND TIBC
Iron: 98 ug/dL (ref 42–163)
Saturation Ratios: 50 % (ref 20–55)
TIBC: 198 ug/dL — ABNORMAL LOW (ref 202–409)
UIBC: 100 ug/dL — ABNORMAL LOW (ref 117–376)

## 2019-04-20 LAB — FERRITIN: Ferritin: 53 ng/mL (ref 24–336)

## 2019-04-20 NOTE — Telephone Encounter (Signed)
Appointments scheduled calendar printed per 1/11 los

## 2019-04-21 ENCOUNTER — Telehealth: Payer: Self-pay | Admitting: *Deleted

## 2019-04-21 NOTE — Telephone Encounter (Signed)
Call received from patient concerned about Dr. Antonieta Pert order for a phlebotomy at this time and would like to hold off on phlebotomy for now.  Dr. Marin Olp notified and OK with pt not having a phlebotomy at this time.  Pt notified and appt canceled.

## 2019-04-21 NOTE — Telephone Encounter (Signed)
Call placed to patient and message left to inform pt that Dr. Marin Olp would like for him to get a phlebotomy d/t iron saturation level.  Instructed pt to call office back to schedule phlebotomy.  Message sent to scheduling.

## 2019-04-22 ENCOUNTER — Inpatient Hospital Stay: Payer: Medicare Other

## 2019-04-22 DIAGNOSIS — Z20828 Contact with and (suspected) exposure to other viral communicable diseases: Secondary | ICD-10-CM | POA: Diagnosis not present

## 2019-04-22 DIAGNOSIS — Z1159 Encounter for screening for other viral diseases: Secondary | ICD-10-CM | POA: Diagnosis not present

## 2019-04-26 DIAGNOSIS — Z20828 Contact with and (suspected) exposure to other viral communicable diseases: Secondary | ICD-10-CM | POA: Diagnosis not present

## 2019-04-26 DIAGNOSIS — Z1159 Encounter for screening for other viral diseases: Secondary | ICD-10-CM | POA: Diagnosis not present

## 2019-04-29 DIAGNOSIS — Z23 Encounter for immunization: Secondary | ICD-10-CM | POA: Diagnosis not present

## 2019-05-03 DIAGNOSIS — Z1159 Encounter for screening for other viral diseases: Secondary | ICD-10-CM | POA: Diagnosis not present

## 2019-05-03 DIAGNOSIS — Z20828 Contact with and (suspected) exposure to other viral communicable diseases: Secondary | ICD-10-CM | POA: Diagnosis not present

## 2019-05-05 DIAGNOSIS — M25511 Pain in right shoulder: Secondary | ICD-10-CM | POA: Diagnosis not present

## 2019-05-05 DIAGNOSIS — Z96611 Presence of right artificial shoulder joint: Secondary | ICD-10-CM | POA: Diagnosis not present

## 2019-05-05 DIAGNOSIS — M25512 Pain in left shoulder: Secondary | ICD-10-CM | POA: Diagnosis not present

## 2019-05-06 DIAGNOSIS — Z20828 Contact with and (suspected) exposure to other viral communicable diseases: Secondary | ICD-10-CM | POA: Diagnosis not present

## 2019-05-06 DIAGNOSIS — Z1159 Encounter for screening for other viral diseases: Secondary | ICD-10-CM | POA: Diagnosis not present

## 2019-05-10 DIAGNOSIS — Z20828 Contact with and (suspected) exposure to other viral communicable diseases: Secondary | ICD-10-CM | POA: Diagnosis not present

## 2019-05-10 DIAGNOSIS — Z1159 Encounter for screening for other viral diseases: Secondary | ICD-10-CM | POA: Diagnosis not present

## 2019-05-13 DIAGNOSIS — Z20828 Contact with and (suspected) exposure to other viral communicable diseases: Secondary | ICD-10-CM | POA: Diagnosis not present

## 2019-05-13 DIAGNOSIS — Z1159 Encounter for screening for other viral diseases: Secondary | ICD-10-CM | POA: Diagnosis not present

## 2019-05-17 DIAGNOSIS — Z1159 Encounter for screening for other viral diseases: Secondary | ICD-10-CM | POA: Diagnosis not present

## 2019-05-17 DIAGNOSIS — Z20828 Contact with and (suspected) exposure to other viral communicable diseases: Secondary | ICD-10-CM | POA: Diagnosis not present

## 2019-05-20 DIAGNOSIS — Z1159 Encounter for screening for other viral diseases: Secondary | ICD-10-CM | POA: Diagnosis not present

## 2019-05-20 DIAGNOSIS — Z20828 Contact with and (suspected) exposure to other viral communicable diseases: Secondary | ICD-10-CM | POA: Diagnosis not present

## 2019-05-24 DIAGNOSIS — Z1159 Encounter for screening for other viral diseases: Secondary | ICD-10-CM | POA: Diagnosis not present

## 2019-05-24 DIAGNOSIS — Z20828 Contact with and (suspected) exposure to other viral communicable diseases: Secondary | ICD-10-CM | POA: Diagnosis not present

## 2019-05-27 DIAGNOSIS — Z23 Encounter for immunization: Secondary | ICD-10-CM | POA: Diagnosis not present

## 2019-05-31 ENCOUNTER — Inpatient Hospital Stay: Payer: Medicare Other | Attending: Hematology & Oncology

## 2019-05-31 ENCOUNTER — Other Ambulatory Visit: Payer: Self-pay

## 2019-05-31 VITALS — BP 127/65 | HR 57 | Temp 97.5°F | Resp 17

## 2019-05-31 DIAGNOSIS — Z452 Encounter for adjustment and management of vascular access device: Secondary | ICD-10-CM | POA: Insufficient documentation

## 2019-05-31 DIAGNOSIS — D500 Iron deficiency anemia secondary to blood loss (chronic): Secondary | ICD-10-CM

## 2019-05-31 DIAGNOSIS — Z85820 Personal history of malignant melanoma of skin: Secondary | ICD-10-CM | POA: Diagnosis not present

## 2019-05-31 DIAGNOSIS — Z1159 Encounter for screening for other viral diseases: Secondary | ICD-10-CM | POA: Diagnosis not present

## 2019-05-31 DIAGNOSIS — Z20828 Contact with and (suspected) exposure to other viral communicable diseases: Secondary | ICD-10-CM | POA: Diagnosis not present

## 2019-05-31 MED ORDER — HEPARIN SOD (PORK) LOCK FLUSH 100 UNIT/ML IV SOLN
500.0000 [IU] | Freq: Once | INTRAVENOUS | Status: AC
  Administered 2019-05-31: 500 [IU] via INTRAVENOUS
  Filled 2019-05-31: qty 5

## 2019-05-31 MED ORDER — SODIUM CHLORIDE 0.9% FLUSH
10.0000 mL | INTRAVENOUS | Status: DC | PRN
  Administered 2019-05-31: 11:00:00 10 mL via INTRAVENOUS
  Filled 2019-05-31: qty 10

## 2019-05-31 NOTE — Patient Instructions (Signed)

## 2019-06-07 DIAGNOSIS — Z1159 Encounter for screening for other viral diseases: Secondary | ICD-10-CM | POA: Diagnosis not present

## 2019-06-07 DIAGNOSIS — Z20828 Contact with and (suspected) exposure to other viral communicable diseases: Secondary | ICD-10-CM | POA: Diagnosis not present

## 2019-06-14 DIAGNOSIS — Z20828 Contact with and (suspected) exposure to other viral communicable diseases: Secondary | ICD-10-CM | POA: Diagnosis not present

## 2019-06-14 DIAGNOSIS — Z1159 Encounter for screening for other viral diseases: Secondary | ICD-10-CM | POA: Diagnosis not present

## 2019-06-15 ENCOUNTER — Encounter: Payer: Self-pay | Admitting: Internal Medicine

## 2019-06-15 ENCOUNTER — Other Ambulatory Visit: Payer: Self-pay

## 2019-06-15 ENCOUNTER — Ambulatory Visit (INDEPENDENT_AMBULATORY_CARE_PROVIDER_SITE_OTHER): Payer: Medicare Other | Admitting: Internal Medicine

## 2019-06-15 DIAGNOSIS — I10 Essential (primary) hypertension: Secondary | ICD-10-CM

## 2019-06-15 DIAGNOSIS — F419 Anxiety disorder, unspecified: Secondary | ICD-10-CM | POA: Diagnosis not present

## 2019-06-15 DIAGNOSIS — G894 Chronic pain syndrome: Secondary | ICD-10-CM

## 2019-06-15 MED ORDER — HYDROMORPHONE HCL 2 MG PO TABS: 2.0000 mg | ORAL_TABLET | Freq: Three times a day (TID) | ORAL | 0 refills | Status: DC | PRN

## 2019-06-15 NOTE — Assessment & Plan Note (Signed)
Dilaudid prn  Potential benefits of a long term opioids use as well as potential risks (i.e. addiction risk, apnea etc) and complications (i.e. Somnolence, constipation and others) were explained to the patient and were aknowledged.

## 2019-06-15 NOTE — Assessment & Plan Note (Signed)
Lopressor and Losartan HCT

## 2019-06-15 NOTE — Assessment & Plan Note (Signed)
Xanax prn  Potential benefits of a long term benzodiazepines  use as well as potential risks  and complications were explained to the patient and were aknowledged. Not to take w/dilaudid

## 2019-06-15 NOTE — Progress Notes (Signed)
Subjective:  Patient ID: [name], male    DOB: [birth year]  Age: 74 y.o. MRN: [id number]  CC: No chief complaint on file.   HPI NIC LAMPE presents for RA, chronic pain, anxiety and HTN f/u  Outpatient Medications Prior to Visit  Medication Sig Dispense Refill  . albuterol (PROVENTIL HFA;VENTOLIN HFA) 108 (90 Base) MCG/ACT inhaler Inhale 1-2 puffs into the lungs every 4 (four) hours as needed for wheezing or shortness of breath. 1 Inhaler 1  . alfuzosin (UROXATRAL) 10 MG 24 hr tablet Take 10 mg by mouth at bedtime.   0  . ALPRAZolam (XANAX) 0.5 MG tablet Take 1 tablet (0.5 mg total) by mouth 2 (two) times daily as needed. for anxiety 60 tablet 3  . Cholecalciferol (VITAMIN D3) 2000 UNITS capsule Take 1 capsule (2,000 Units total) by mouth daily. 100 capsule 3  . clopidogrel (PLAVIX) 75 MG tablet TAKE 1 TABLET BY MOUTH EVERY DAY WITH BREAKFAST 90 tablet 3  . dronabinol (MARINOL) 5 MG capsule Take 1 capsule (5 mg total) by mouth 2 (two) times daily before a meal. 60 capsule 2  . FLUoxetine (PROZAC) 40 MG capsule TAKE 1 CAPSULE BY MOUTH TWICE DAILY 180 capsule 3  . fluticasone (FLONASE) 50 MCG/ACT nasal spray INSTILL 2 SPRAYS IN EACH NOSTRIL EVERY DAY 16 g 11  . gabapentin (NEURONTIN) 300 MG capsule TAKE 1 CAPSULE(300 MG) BY MOUTH TWICE DAILY 180 capsule 1  . HYDROmorphone (DILAUDID) 2 MG tablet Take 1 tablet (2 mg total) by mouth every 8 (eight) hours as needed for severe pain. 90 tablet 0  . lidocaine-prilocaine (EMLA) cream Apply 1 application topically as needed. 30 g 5  . losartan-hydrochlorothiazide (HYZAAR) 50-12.5 MG tablet TAKE 1 TABLET BY MOUTH DAILY 90 tablet 3  . Melatonin 5 MG TABS Take 5 mg by mouth at bedtime. Takes 10 mg at HS    . metoprolol tartrate (LOPRESSOR) 25 MG tablet TAKE 1 TABLET (25 MG TOTAL) BY MOUTH 2 (TWO) TIMES DAILY. 180 tablet 3  . Omega-3 Fatty Acids (FISH OIL OMEGA-3) 1000 MG CAPS Take 1,000 mg by mouth daily.     . ondansetron (ZOFRAN) 8  MG tablet Take 1 tablet (8 mg total) by mouth 3 (three) times daily. 90 tablet 3  . pantoprazole (PROTONIX) 40 MG tablet TAKE 1 TABLET (40 MG TOTAL) BY MOUTH EVERY EVENING. 90 tablet 3  . pravastatin (PRAVACHOL) 20 MG tablet Take 1 tablet (20 mg total) by mouth daily. (Patient taking differently: Take 20 mg by mouth every evening. ) 90 tablet 1  . predniSONE (DELTASONE) 5 MG tablet TAKE 2 TABLETS (10 MG TOTAL) BY MOUTH DAILY WITH BREAKFAST. 180 tablet 3  . prochlorperazine (COMPAZINE) 10 MG tablet TAKE 1 TABLET(10 MG) BY MOUTH EVERY 6 HOURS AS NEEDED FOR NAUSEA OR VOMITING 385 tablet 1  . Pyridoxine HCl (B-6 PO) Take 1 tablet by mouth.    . tadalafil (CIALIS) 5 MG tablet Take 5 mg by mouth as needed.    . torsemide (DEMADEX) 10 MG tablet Take 1-2 tablets (10-20 mg total) by mouth daily. (Patient taking differently: Take 10-20 mg by mouth as needed. ) 60 tablet 3  . traZODone (DESYREL) 100 MG tablet 1 po at hs 90 tablet 3  . vitamin B-12 (CYANOCOBALAMIN) 1000 MCG tablet Take 1,000 mcg by mouth daily.      No facility-administered medications prior to visit.    ROS: Review of Systems  Constitutional: Negative for appetite change, fatigue and  unexpected weight change.  HENT: Negative for congestion, nosebleeds, sneezing, sore throat and trouble swallowing.   Eyes: Negative for itching and visual disturbance.  Respiratory: Negative for cough.   Cardiovascular: Negative for chest pain, palpitations and leg swelling.  Gastrointestinal: Negative for abdominal distention, blood in stool, diarrhea and nausea.  Genitourinary: Negative for frequency and hematuria.  Musculoskeletal: Negative for back pain, gait problem, joint swelling and neck pain.  Skin: Negative for rash.  Neurological: Negative for dizziness, tremors, speech difficulty and weakness.  Psychiatric/Behavioral: Negative for agitation, dysphoric mood, sleep disturbance and suicidal ideas. The patient is not nervous/anxious.      Objective:  BP 132/86 (BP Location: Left Arm, Patient Position: Sitting, Cuff Size: Normal)   Pulse (!) 53   Temp 98.4 F (36.9 C) (Oral)   Ht 5' 6"  (1.676 m)   Wt 144 lb (65.3 kg)   SpO2 93%   BMI 23.24 kg/m   BP Readings from Last 3 Encounters:  06/15/19 132/86  05/31/19 127/65  04/19/19 118/88    Wt Readings from Last 3 Encounters:  06/15/19 144 lb (65.3 kg)  04/19/19 147 lb (66.7 kg)  03/17/19 146 lb (66.2 kg)    Physical Exam Constitutional:      General: [name] is not in acute distress.    Appearance: [name] is well-developed.     Comments: NAD  Eyes:     Conjunctiva/sclera: Conjunctivae normal.     Pupils: Pupils are equal, round, and reactive to light.  Neck:     Thyroid: No thyromegaly.     Vascular: No JVD.  Cardiovascular:     Rate and Rhythm: Normal rate and regular rhythm.     Heart sounds: Normal heart sounds. No murmur. No friction rub. No gallop.   Pulmonary:     Effort: Pulmonary effort is normal. No respiratory distress.     Breath sounds: Normal breath sounds. No wheezing or rales.  Chest:     Chest wall: No tenderness.  Abdominal:     General: Bowel sounds are normal. There is no distension.     Palpations: Abdomen is soft. There is no mass.     Tenderness: There is no abdominal tenderness. There is no guarding or rebound.  Musculoskeletal:        General: No tenderness. Normal range of motion.     Cervical back: Normal range of motion.  Lymphadenopathy:     Cervical: No cervical adenopathy.  Skin:    General: Skin is warm and dry.     Findings: No rash.  Neurological:     Mental Status: [name] is alert and oriented to person, place, and time.     Cranial Nerves: No cranial nerve deficit.     Motor: No abnormal muscle tone.     Coordination: Coordination normal.     Gait: Gait normal.     Deep Tendon Reflexes: Reflexes are normal and symmetric.  Psychiatric:        Behavior: Behavior normal.        Thought Content: Thought content normal.         Judgment: Judgment normal.     Lab Results  Component Value Date   WBC 6.4 04/19/2019   HGB 12.8 (L) 04/19/2019   HCT 38.4 (L) 04/19/2019   PLT 206 04/19/2019   GLUCOSE 93 04/19/2019   CHOL 128 03/10/2015   TRIG 95.0 03/10/2015   HDL 39.30 03/10/2015   LDLCALC 70 03/10/2015   ALT 11 04/19/2019   AST  11 (L) 04/19/2019   NA 137 04/19/2019   K 3.8 04/19/2019   CL 100 04/19/2019   CREATININE 1.18 04/19/2019   BUN 33 (H) 04/19/2019   CO2 30 04/19/2019   TSH 0.680 09/18/2017   PSA 1.95 10/15/2010   INR 1.1 (H) 06/14/2014   HGBA1C 6.0 09/28/2015    NM PET Image Restage (PS) Whole Body  Result Date: 07/14/2018 CLINICAL DATA:  Subsequent treatment strategy for restaging of melanoma. EXAM: NUCLEAR MEDICINE PET WHOLE BODY TECHNIQUE: 6.8 mCi F-18 FDG was injected intravenously. Full-ring PET imaging was performed from the skull base to thigh after the radiotracer. CT data was obtained and used for attenuation correction and anatomic localization. Fasting blood glucose: 81 mg/dl COMPARISON:  04/03/2018.  Clinic note of 06/11/2018 FINDINGS: Mediastinal blood pool activity: SUV max 1.8 HEAD/NECK: No abnormal intracranial or cervical nodal hypermetabolism. Incidental CT findings: Bilateral carotid atherosclerosis. No cervical adenopathy. CHEST: Development of relatively mild bilateral, upper lobe predominant hypermetabolic ground-glass opacities. Example in the right upper lobe at on the order of 2.8 cm and a S.U.V. max of 6.3 on image 26/8. Within the anterior left upper lobe at a S.U.V. max of 4.4 on image 33/8. No thoracic nodal hypermetabolism. Incidental CT findings: Right hemidiaphragm elevation. Mild cardiomegaly with aortic and coronary artery atherosclerosis. Left Port-A-Cath tip at mid SVC. Pulmonary artery enlargement, outflow tract 4.0 cm. Beam hardening artifact from bilateral shoulder arthroplasty. ABDOMEN/PELVIS: No abdominopelvic nodal or parenchymal hypermetabolism identified.  Incidental CT findings: Beam hardening artifact from lumbar spine fixation. Grossly normal adrenal glands. Mild renal cortical thinning bilaterally. Low-density bilateral renal lesions are difficult to directly quantify secondary to beam hardening artifact from adjacent lumbar spine hardware. Likely cysts based on morphology. Grossly similar to on the prior. Abdominal aortic atherosclerosis. Mild prostatomegaly. Right larger than left hydroceles. SKELETON: No abnormal marrow activity. Incidental CT findings: none EXTREMITIES: No abnormal activity within the extremities. 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 Surgical change about both feet and the right ankle. IMPRESSION: 1. No findings of residual or recurrent hypermetabolic melanoma. 2. Development of bilateral hypermetabolic upper lobe predominant ground-glass opacities. Favor drug toxicity. Given absence of fever or shortness of breath on prior clinic note of 06/11/2018, atypical or viral infectious process is felt less likely. 3. Multifactorial degradation as detailed above. 4. Coronary artery atherosclerosis. Aortic Atherosclerosis (ICD10-I70.0). 5. Pulmonary artery enlargement suggests pulmonary arterial hypertension. These results will be called to the ordering clinician or representative by the Radiologist Assistant, and communication documented in the PACS or zVision Dashboard. Electronically Signed   By: Abigail Miyamoto M.D.   On: 07/14/2018 16:54    Assessment & Plan:   There are no diagnoses linked to this encounter.   No orders of the defined types were placed in this encounter.    Follow-up: No follow-ups on file.  [name], MD

## 2019-06-21 DIAGNOSIS — Z20828 Contact with and (suspected) exposure to other viral communicable diseases: Secondary | ICD-10-CM | POA: Diagnosis not present

## 2019-06-21 DIAGNOSIS — Z1159 Encounter for screening for other viral diseases: Secondary | ICD-10-CM | POA: Diagnosis not present

## 2019-06-28 DIAGNOSIS — Z1159 Encounter for screening for other viral diseases: Secondary | ICD-10-CM | POA: Diagnosis not present

## 2019-06-28 DIAGNOSIS — Z20828 Contact with and (suspected) exposure to other viral communicable diseases: Secondary | ICD-10-CM | POA: Diagnosis not present

## 2019-07-05 DIAGNOSIS — Z20828 Contact with and (suspected) exposure to other viral communicable diseases: Secondary | ICD-10-CM | POA: Diagnosis not present

## 2019-07-05 DIAGNOSIS — Z1159 Encounter for screening for other viral diseases: Secondary | ICD-10-CM | POA: Diagnosis not present

## 2019-07-12 DIAGNOSIS — Z1159 Encounter for screening for other viral diseases: Secondary | ICD-10-CM | POA: Diagnosis not present

## 2019-07-12 DIAGNOSIS — Z20828 Contact with and (suspected) exposure to other viral communicable diseases: Secondary | ICD-10-CM | POA: Diagnosis not present

## 2019-07-13 ENCOUNTER — Other Ambulatory Visit: Payer: Self-pay | Admitting: Internal Medicine

## 2019-07-19 ENCOUNTER — Inpatient Hospital Stay (HOSPITAL_BASED_OUTPATIENT_CLINIC_OR_DEPARTMENT_OTHER): Payer: Medicare Other | Admitting: Hematology & Oncology

## 2019-07-19 ENCOUNTER — Inpatient Hospital Stay: Payer: Medicare Other

## 2019-07-19 ENCOUNTER — Telehealth: Payer: Self-pay | Admitting: Hematology & Oncology

## 2019-07-19 ENCOUNTER — Encounter: Payer: Self-pay | Admitting: Hematology & Oncology

## 2019-07-19 ENCOUNTER — Inpatient Hospital Stay: Payer: Medicare Other | Attending: Hematology & Oncology

## 2019-07-19 ENCOUNTER — Other Ambulatory Visit: Payer: Self-pay

## 2019-07-19 VITALS — BP 133/68 | HR 55 | Temp 97.5°F | Resp 18 | Wt 144.0 lb

## 2019-07-19 DIAGNOSIS — Z79899 Other long term (current) drug therapy: Secondary | ICD-10-CM | POA: Insufficient documentation

## 2019-07-19 DIAGNOSIS — C4372 Malignant melanoma of left lower limb, including hip: Secondary | ICD-10-CM | POA: Diagnosis not present

## 2019-07-19 DIAGNOSIS — Z20828 Contact with and (suspected) exposure to other viral communicable diseases: Secondary | ICD-10-CM | POA: Diagnosis not present

## 2019-07-19 DIAGNOSIS — Z452 Encounter for adjustment and management of vascular access device: Secondary | ICD-10-CM | POA: Insufficient documentation

## 2019-07-19 DIAGNOSIS — K518 Other ulcerative 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: Secondary | ICD-10-CM

## 2019-07-19 DIAGNOSIS — R197 Diarrhea, unspecified: Secondary | ICD-10-CM

## 2019-07-19 DIAGNOSIS — Z85820 Personal history of malignant melanoma of skin: Secondary | ICD-10-CM | POA: Insufficient documentation

## 2019-07-19 DIAGNOSIS — Z1159 Encounter for screening for other viral diseases: Secondary | ICD-10-CM | POA: Diagnosis not present

## 2019-07-19 DIAGNOSIS — D500 Iron deficiency anemia secondary to blood loss (chronic): Secondary | ICD-10-CM

## 2019-07-19 DIAGNOSIS — Z9221 Personal history of antineoplastic chemotherapy: Secondary | ICD-10-CM | POA: Insufficient documentation

## 2019-07-19 LAB — RETICULOCYTES
Immature Retic Fract: 4.3 % (ref 2.3–15.9)
RBC.: 3.89 MIL/uL — ABNORMAL LOW (ref 4.22–5.81)
Retic Count, Absolute: 65.4 10*3/uL (ref 19.0–186.0)
Retic Ct Pct: 1.7 % (ref 0.4–3.1)

## 2019-07-19 LAB — CBC WITH DIFFERENTIAL (CANCER CENTER ONLY)
Abs Immature Granulocytes: 0.02 10*3/uL (ref 0.00–0.07)
Basophils Absolute: 0 10*3/uL (ref 0.0–0.1)
Basophils Relative: 1 %
Eosinophils Absolute: 0.1 10*3/uL (ref 0.0–0.5)
Eosinophils Relative: 1 %
HCT: 35.6 % — ABNORMAL LOW (ref 39.0–52.0)
Hemoglobin: 12.2 g/dL — ABNORMAL LOW (ref 13.0–17.0)
Immature Granulocytes: 0 %
Lymphocytes Relative: 25 %
Lymphs Abs: 1.4 10*3/uL (ref 0.7–4.0)
MCH: 31.4 pg (ref 26.0–34.0)
MCHC: 34.3 g/dL (ref 30.0–36.0)
MCV: 91.5 fL (ref 80.0–100.0)
Monocytes Absolute: 0.7 10*3/uL (ref 0.1–1.0)
Monocytes Relative: 12 %
Neutro Abs: 3.5 10*3/uL (ref 1.7–7.7)
Neutrophils Relative %: 61 %
Platelet Count: 229 10*3/uL (ref 150–400)
RBC: 3.89 MIL/uL — ABNORMAL LOW (ref 4.22–5.81)
RDW: 12.2 % (ref 11.5–15.5)
WBC Count: 5.8 10*3/uL (ref 4.0–10.5)
nRBC: 0 % (ref 0.0–0.2)

## 2019-07-19 LAB — CMP (CANCER CENTER ONLY)
ALT: 8 U/L (ref 0–44)
AST: 10 U/L — ABNORMAL LOW (ref 15–41)
Albumin: 3.8 g/dL (ref 3.5–5.0)
Alkaline Phosphatase: 55 U/L (ref 38–126)
Anion gap: 5 (ref 5–15)
BUN: 17 mg/dL (ref 8–23)
CO2: 31 mmol/L (ref 22–32)
Calcium: 8.8 mg/dL — ABNORMAL LOW (ref 8.9–10.3)
Chloride: 92 mmol/L — ABNORMAL LOW (ref 98–111)
Creatinine: 0.91 mg/dL (ref 0.61–1.24)
GFR, Est AFR Am: >60 mL/min (ref >60)
GFR, Estimated: >60 mL/min (ref >60)
Glucose, Bld: 98 mg/dL (ref 70–99)
Potassium: 3.3 mmol/L — ABNORMAL LOW (ref 3.5–5.1)
Sodium: 128 mmol/L — ABNORMAL LOW (ref 135–145)
Total Bilirubin: 0.6 mg/dL (ref 0.3–1.2)
Total Protein: 5.9 g/dL — ABNORMAL LOW (ref 6.5–8.1)

## 2019-07-19 MED ORDER — SODIUM CHLORIDE 0.9% FLUSH
3.0000 mL | Freq: Once | INTRAVENOUS | Status: AC | PRN
  Administered 2019-07-19: 10:00:00 10 mL via INTRAVENOUS
  Filled 2019-07-19: qty 10

## 2019-07-19 MED ORDER — HEPARIN SOD (PORK) LOCK FLUSH 100 UNIT/ML IV SOLN
250.0000 [IU] | Freq: Once | INTRAVENOUS | Status: AC | PRN
  Administered 2019-07-19: 10:00:00 500 [IU]
  Filled 2019-07-19: qty 5

## 2019-07-19 NOTE — Patient Instructions (Signed)

## 2019-07-19 NOTE — Telephone Encounter (Signed)
Appointments scheduled calendar printed per 4/12 los

## 2019-07-19 NOTE — Progress Notes (Signed)
Hematology and Oncology Follow Up Visit  [name] [id number] [birth year] 74 y.o. 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 Hu is here today for follow-up.  Looks quite good.  He had a wonderful weekend.  We last saw him I think back in January.  Since then, he really has had no specific complaints.  The hemochromatosis really has not been a problem.  His last ferritin was 53 with iron saturation of 50%.  We are being quite liberal with respect to having to phlebotomize him.  He has had no fever.  He has had his coronavirus vaccines.  He and his wife are going to [place] tomorrow for her birthday.  He is looking forward to this.  He has had a good appetite.  He is not lost weight.  He has had no diarrhea.  He and his wife are at assisted living right now.  They enjoyed their.  He has had no fever.  Has had no rashes.  He has had no leg swelling.      Overall, his performance status is ECOG 1.  Medications:  Allergies as of 07/19/2019      Reactions   Cefepime Hives, Shortness Of Breath   Cephaeline Hives   Morphine Swelling   A swollen stomach.   Morphine And Related Shortness Of Breath, Nausea And Vomiting, Swelling, Other (See Comments)   Agitation, tolerates dilaudid Other reaction(s): Other (See Comments) Agitation, tolerates dilaudid   Penicillins Hives, Shortness Of Breath, Rash   Has patient had a PCN reaction causing immediate rash, facial/tongue/throat swelling, SOB or lightheadedness with hypotension: Yes Has patient had a PCN reaction causing severe rash involving mucus membranes or skin necrosis:  Yes Has patient had a PCN reaction that required hospitalization No Has patient had a PCN reaction occurring within the last 10 years: No If all of the above answers are "NO", then may proceed with Cephalosporin use.   Doxycycline Rash   Oxycodone-acetaminophen Other (See Comments)   Patient doesn't remember what type of reaction.      Medication List       Accurate as of July 19, 2019 10:45 AM. If you have any questions, ask your nurse or doctor.        albuterol 108 (90 Base) MCG/ACT inhaler Commonly known as: VENTOLIN HFA Inhale 1-2 puffs into the lungs every 4 (four) hours as needed for wheezing or shortness of breath.   alfuzosin 10 MG 24 hr tablet Commonly known as: UROXATRAL Take 10 mg by mouth at bedtime.   ALPRAZolam 0.5 MG tablet Commonly known as: XANAX Take 1 tablet (0.5 mg total) by mouth 2 (two) times daily as needed. for anxiety   B-6 PO Take 1 tablet by mouth.   Cialis 5 MG tablet Generic drug: tadalafil Take 5 mg by mouth as needed.   clopidogrel 75 MG tablet Commonly known as: PLAVIX TAKE 1 TABLET BY MOUTH EVERY DAY WITH BREAKFAST   dronabinol 5 MG capsule Commonly known as: MARINOL Take 1 capsule (5 mg total) by mouth 2 (two) times daily before a meal.   Fish Oil Omega-3 1000 MG Caps Take 1,000 mg by mouth daily.  FLUoxetine 40 MG capsule Commonly known as: PROZAC TAKE 1 CAPSULE BY MOUTH TWICE DAILY   fluticasone 50 MCG/ACT nasal spray Commonly known as: FLONASE INSTILL 2 SPRAYS IN EACH NOSTRIL EVERY DAY   gabapentin 300 MG capsule Commonly known as: NEURONTIN TAKE 1 CAPSULE(300 MG) BY MOUTH TWICE DAILY   HYDROmorphone 2 MG tablet Commonly known as: DILAUDID Take 1 tablet (2 mg total) by mouth every 8 (eight) hours as needed for severe pain.   HYDROmorphone 2 MG tablet Commonly known as: DILAUDID Take 1 tablet (2 mg total) by mouth every 8 (eight) hours as needed for severe pain.   HYDROmorphone 2 MG tablet Commonly known as:  DILAUDID Take 1 tablet (2 mg total) by mouth every 8 (eight) hours as needed for severe pain.   lidocaine-prilocaine cream Commonly known as: EMLA Apply 1 application topically as needed.   losartan-hydrochlorothiazide 50-12.5 MG tablet Commonly known as: HYZAAR TAKE 1 TABLET BY MOUTH DAILY   melatonin 5 MG Tabs Take 5 mg by mouth at bedtime. Takes 10 mg at HS   metoprolol tartrate 25 MG tablet Commonly known as: LOPRESSOR TAKE 1 TABLET (25 MG TOTAL) BY MOUTH 2 (TWO) TIMES DAILY.   ondansetron 8 MG tablet Commonly known as: ZOFRAN Take 1 tablet (8 mg total) by mouth 3 (three) times daily.   pantoprazole 40 MG tablet Commonly known as: PROTONIX TAKE 1 TABLET (40 MG TOTAL) BY MOUTH EVERY EVENING.   pravastatin 20 MG tablet Commonly known as: PRAVACHOL Take 1 tablet (20 mg total) by mouth daily. What changed: when to take this   predniSONE 5 MG tablet Commonly known as: DELTASONE TAKE 2 TABLETS (10 MG TOTAL) BY MOUTH DAILY WITH BREAKFAST.   prochlorperazine 10 MG tablet Commonly known as: COMPAZINE TAKE 1 TABLET(10 MG) BY MOUTH EVERY 6 HOURS AS NEEDED FOR NAUSEA OR VOMITING   torsemide 10 MG tablet Commonly known as: Demadex Take 1-2 tablets (10-20 mg total) by mouth daily. What changed:   when to take this  reasons to take this   traZODone 100 MG tablet Commonly known as: DESYREL TAKE 1 TABLET AT BEDTIME   vitamin B-12 1000 MCG tablet Commonly known as: CYANOCOBALAMIN Take 1,000 mcg by mouth daily.   Vitamin D3 50 MCG (2000 UT) capsule Take 1 capsule (2,000 Units total) by mouth daily.       Allergies:  Allergies  Allergen Reactions  . Cefepime Hives and Shortness Of Breath  . Cephaeline Hives  . Morphine Swelling    A swollen stomach.  . Morphine And Related Shortness Of Breath, Nausea And Vomiting, Swelling and Other (See Comments)    Agitation, tolerates dilaudid Other reaction(s): Other (See Comments) Agitation, tolerates dilaudid  .  Penicillins Hives, Shortness Of Breath and Rash    Has patient had a PCN reaction causing immediate rash, facial/tongue/throat swelling, SOB or lightheadedness with hypotension: Yes Has patient had a PCN reaction causing severe rash involving mucus membranes or skin necrosis: Yes Has patient had a PCN reaction that required hospitalization No Has patient had a PCN reaction occurring within the last 10 years: No If all of the above answers are "NO", then may proceed with Cephalosporin use.   [name] Doxycycline Rash  . Oxycodone-Acetaminophen Other (See Comments)    Patient doesn't remember what type of reaction.    Past Medical History, Surgical history, Social history, and Family History were reviewed and updated.  Review of Systems: Review of Systems  Constitutional: Negative.   HENT: Negative.  Eyes: Negative.   Respiratory: Negative.   Cardiovascular: Negative.   Gastrointestinal: Negative.   Genitourinary: Negative.   Musculoskeletal: Negative.   Skin: Negative.   Neurological: Negative.   Endo/Heme/Allergies: Negative.   Psychiatric/Behavioral: Negative.     Physical Exam:  weight is 144 lb (65.3 kg). His temporal temperature is 97.5 F (36.4 C) (abnormal). His blood pressure is 133/68 and his pulse is 55 (abnormal). His respiration is 18 and oxygen saturation is 95%.   Wt Readings from Last 3 Encounters:  07/19/19 144 lb (65.3 kg)  06/15/19 144 lb (65.3 kg)  04/19/19 147 lb (66.7 kg)   Physical Exam Vitals reviewed.  HENT:     Head: Normocephalic and atraumatic.  Eyes:     Pupils: Pupils are equal, round, and reactive to light.  Cardiovascular:     Rate and Rhythm: Normal rate and regular rhythm.     Heart sounds: Normal heart sounds.  Pulmonary:     Effort: Pulmonary effort is normal.     Breath sounds: Normal breath sounds.  Abdominal:     General: Bowel sounds are normal.     Palpations: Abdomen is soft.  Musculoskeletal:        General: No tenderness or  deformity. Normal range of motion.     Cervical back: Normal range of motion.  Lymphadenopathy:     Cervical: No cervical adenopathy.  Skin:    General: Skin is warm and dry.     Findings: No erythema or rash.  Neurological:     Mental Status: He is alert and oriented to person, place, and time.  Psychiatric:        Behavior: Behavior normal.        Thought Content: Thought content normal.        Judgment: Judgment normal.      Lab Results  Component Value Date   WBC 5.8 07/19/2019   HGB 12.2 (L) 07/19/2019   HCT 35.6 (L) 07/19/2019   MCV 91.5 07/19/2019   PLT 229 07/19/2019   Lab Results  Component Value Date   FERRITIN 53 04/19/2019   IRON 98 04/19/2019   TIBC 198 (L) 04/19/2019   UIBC 100 (L) 04/19/2019   IRONPCTSAT 50 04/19/2019   Lab Results  Component Value Date   RETICCTPCT 1.7 07/19/2019   RBC 3.89 (L) 07/19/2019   RBC 3.89 (L) 07/19/2019   No results found for: KPAFRELGTCHN, LAMBDASER, KAPLAMBRATIO No results found for: IGGSERUM, IGA, IGMSERUM No results found for: Kathrynn Ducking, MSPIKE, SPEI   Chemistry      Component Value Date/Time   NA 128 (L) 07/19/2019 0950   NA 140 04/10/2017 1046   NA 135 (L) 03/06/2017 1104   K 3.3 (L) 07/19/2019 0950   K 3.7 04/10/2017 1046   K 4.2 03/06/2017 1104   CL 92 (L) 07/19/2019 0950   CL 98 04/10/2017 1046   CO2 31 07/19/2019 0950   CO2 31 04/10/2017 1046   CO2 30 (H) 03/06/2017 1104   BUN 17 07/19/2019 0950   BUN 21 04/10/2017 1046   BUN 20.0 03/06/2017 1104   CREATININE 0.91 07/19/2019 0950   CREATININE 0.9 04/10/2017 1046   CREATININE 1.0 03/06/2017 1104      Component Value Date/Time   CALCIUM 8.8 (L) 07/19/2019 0950   CALCIUM 8.9 04/10/2017 1046   CALCIUM 9.4 03/06/2017 1104   ALKPHOS 55 07/19/2019 0950   ALKPHOS 65 04/10/2017 1046   ALKPHOS 78 03/06/2017 1104  AST 10 (L) 07/19/2019 0950   AST 20 03/06/2017 1104   ALT 8 07/19/2019 0950   ALT 21  04/10/2017 1046   ALT 20 03/06/2017 1104   BILITOT 0.6 07/19/2019 0950   BILITOT 0.47 03/06/2017 1104       Impression and Plan: Mr. Brainerd is a very pleasant 74 yo caucasian gentleman with history of hemochromatosis, homozygous for the C282Y mutation which has not been a problem for him. We will see what his iron studies look like today.  He also has history of stage IIIC nodular melanoma of the left thigh. He completed treatment with Opdivo in October 2019.  I am glad that he is doing so well.  I am glad that his quality of life is what he wants it to be.  I know that the melanoma can always come back.  So far, we have not found that this is the case.  I do not see any issue or any reason to do scans on him right now.  We will plan to get him back in 3 more months.  We will make sure his Port-A-Cath gets flushed in between visits.  [name], MD 4/12/202110:45 AM

## 2019-07-20 ENCOUNTER — Telehealth: Payer: Self-pay | Admitting: Hematology & Oncology

## 2019-07-20 LAB — IRON AND TIBC
Iron: 101 ug/dL (ref 42–163)
Saturation Ratios: 56 % — ABNORMAL HIGH (ref 20–55)
TIBC: 181 ug/dL — ABNORMAL LOW (ref 202–409)
UIBC: 80 ug/dL — ABNORMAL LOW (ref 117–376)

## 2019-07-20 LAB — FERRITIN: Ferritin: 88 ng/mL (ref 24–336)

## 2019-07-20 NOTE — Telephone Encounter (Signed)
Called and LMVM for patient regarding appointment scheduled per 4/13 result note

## 2019-07-26 DIAGNOSIS — Z1159 Encounter for screening for other viral diseases: Secondary | ICD-10-CM | POA: Diagnosis not present

## 2019-07-26 DIAGNOSIS — Z20828 Contact with and (suspected) exposure to other viral communicable diseases: Secondary | ICD-10-CM | POA: Diagnosis not present

## 2019-07-28 DIAGNOSIS — Z85820 Personal history of malignant melanoma of skin: Secondary | ICD-10-CM | POA: Diagnosis not present

## 2019-07-28 DIAGNOSIS — L570 Actinic keratosis: Secondary | ICD-10-CM | POA: Diagnosis not present

## 2019-07-28 DIAGNOSIS — C4441 Basal cell carcinoma of skin of scalp and neck: Secondary | ICD-10-CM | POA: Diagnosis not present

## 2019-07-28 DIAGNOSIS — D692 Other nonthrombocytopenic purpura: Secondary | ICD-10-CM | POA: Diagnosis not present

## 2019-07-28 DIAGNOSIS — L720 Epidermal cyst: Secondary | ICD-10-CM | POA: Diagnosis not present

## 2019-07-28 DIAGNOSIS — L821 Other seborrheic keratosis: Secondary | ICD-10-CM | POA: Diagnosis not present

## 2019-07-28 DIAGNOSIS — D1801 Hemangioma of skin and subcutaneous tissue: Secondary | ICD-10-CM | POA: Diagnosis not present

## 2019-07-28 DIAGNOSIS — D485 Neoplasm of uncertain behavior of skin: Secondary | ICD-10-CM | POA: Diagnosis not present

## 2019-08-02 DIAGNOSIS — Z1159 Encounter for screening for other viral diseases: Secondary | ICD-10-CM | POA: Diagnosis not present

## 2019-08-02 DIAGNOSIS — Z20828 Contact with and (suspected) exposure to other viral communicable diseases: Secondary | ICD-10-CM | POA: Diagnosis not present

## 2019-08-09 DIAGNOSIS — Z1159 Encounter for screening for other viral diseases: Secondary | ICD-10-CM | POA: Diagnosis not present

## 2019-08-09 DIAGNOSIS — Z20828 Contact with and (suspected) exposure to other viral communicable diseases: Secondary | ICD-10-CM | POA: Diagnosis not present

## 2019-08-16 DIAGNOSIS — Z20828 Contact with and (suspected) exposure to other viral communicable diseases: Secondary | ICD-10-CM | POA: Diagnosis not present

## 2019-08-16 DIAGNOSIS — Z1159 Encounter for screening for other viral diseases: Secondary | ICD-10-CM | POA: Diagnosis not present

## 2019-08-23 DIAGNOSIS — Z1159 Encounter for screening for other viral diseases: Secondary | ICD-10-CM | POA: Diagnosis not present

## 2019-08-23 DIAGNOSIS — Z20828 Contact with and (suspected) exposure to other viral communicable diseases: Secondary | ICD-10-CM | POA: Diagnosis not present

## 2019-08-30 ENCOUNTER — Inpatient Hospital Stay: Payer: Medicare Other | Attending: Hematology & Oncology

## 2019-08-30 ENCOUNTER — Other Ambulatory Visit: Payer: Self-pay

## 2019-08-30 VITALS — BP 116/64 | HR 58 | Temp 97.7°F | Resp 18

## 2019-08-30 DIAGNOSIS — Z85820 Personal history of malignant melanoma of skin: Secondary | ICD-10-CM | POA: Diagnosis not present

## 2019-08-30 DIAGNOSIS — D500 Iron deficiency anemia secondary to blood loss (chronic): Secondary | ICD-10-CM

## 2019-08-30 DIAGNOSIS — R197 Diarrhea, unspecified: Secondary | ICD-10-CM

## 2019-08-30 DIAGNOSIS — Z452 Encounter for adjustment and management of vascular access device: Secondary | ICD-10-CM | POA: Diagnosis not present

## 2019-08-30 DIAGNOSIS — C4372 Malignant melanoma of left lower limb, including hip: Secondary | ICD-10-CM

## 2019-08-30 DIAGNOSIS — K518 Other ulcerative 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: Secondary | ICD-10-CM

## 2019-08-30 MED ORDER — HEPARIN SOD (PORK) LOCK FLUSH 100 UNIT/ML IV SOLN
250.0000 [IU] | Freq: Once | INTRAVENOUS | Status: AC | PRN
  Administered 2019-08-30: 500 [IU]
  Filled 2019-08-30: qty 5

## 2019-08-30 MED ORDER — SODIUM CHLORIDE 0.9% FLUSH
3.0000 mL | Freq: Once | INTRAVENOUS | Status: AC | PRN
  Administered 2019-08-30: 10 mL via INTRAVENOUS
  Filled 2019-08-30: qty 10

## 2019-09-06 DIAGNOSIS — Z20828 Contact with and (suspected) exposure to other viral communicable diseases: Secondary | ICD-10-CM | POA: Diagnosis not present

## 2019-09-06 DIAGNOSIS — Z1159 Encounter for screening for other viral diseases: Secondary | ICD-10-CM | POA: Diagnosis not present

## 2019-09-13 DIAGNOSIS — Z20828 Contact with and (suspected) exposure to other viral communicable diseases: Secondary | ICD-10-CM | POA: Diagnosis not present

## 2019-09-13 DIAGNOSIS — Z1159 Encounter for screening for other viral diseases: Secondary | ICD-10-CM | POA: Diagnosis not present

## 2019-09-16 ENCOUNTER — Ambulatory Visit (INDEPENDENT_AMBULATORY_CARE_PROVIDER_SITE_OTHER): Payer: Medicare Other | Admitting: Internal Medicine

## 2019-09-16 ENCOUNTER — Encounter: Payer: Self-pay | Admitting: Internal Medicine

## 2019-09-16 ENCOUNTER — Other Ambulatory Visit: Payer: Self-pay

## 2019-09-16 DIAGNOSIS — D500 Iron deficiency anemia secondary to blood loss (chronic): Secondary | ICD-10-CM

## 2019-09-16 DIAGNOSIS — I480 Paroxysmal atrial fibrillation: Secondary | ICD-10-CM | POA: Diagnosis not present

## 2019-09-16 DIAGNOSIS — I10 Essential (primary) hypertension: Secondary | ICD-10-CM

## 2019-09-16 DIAGNOSIS — G894 Chronic pain syndrome: Secondary | ICD-10-CM | POA: Diagnosis not present

## 2019-09-16 MED ORDER — TRAZODONE HCL 100 MG PO TABS: ORAL_TABLET | ORAL | 3 refills | Status: DC

## 2019-09-16 MED ORDER — HYDROMORPHONE HCL 2 MG PO TABS: 2.0000 mg | ORAL_TABLET | Freq: Three times a day (TID) | ORAL | 0 refills | Status: DC | PRN

## 2019-09-16 MED ORDER — ALPRAZOLAM 0.5 MG PO TABS: 0.5000 mg | ORAL_TABLET | Freq: Two times a day (BID) | ORAL | 3 refills | Status: DC | PRN

## 2019-09-16 NOTE — Assessment & Plan Note (Signed)
Dilaudid prn  Potential benefits of a long term opioids use as well as potential risks (i.e. addiction risk, apnea etc) and complications (i.e. Somnolence, constipation and others) were explained to the patient and were aknowledged.

## 2019-09-16 NOTE — Progress Notes (Signed)
Subjective:  Patient ID: [name], male    DOB: 15-Sep-1945  Age: 74 y.o. MRN: [id number]  CC: No chief complaint on file.   HPI YING [facility]OCKS presents for chronic pain, anxiety and RA f/u  Outpatient Medications Prior to Visit  Medication Sig Dispense Refill  . albuterol (PROVENTIL HFA;VENTOLIN HFA) 108 (90 Base) MCG/ACT inhaler Inhale 1-2 puffs into the lungs every 4 (four) hours as needed for wheezing or shortness of breath. 1 Inhaler 1  . ALPRAZolam (XANAX) 0.5 MG tablet Take 1 tablet (0.5 mg total) by mouth 2 (two) times daily as needed. for anxiety 60 tablet 3  . Cholecalciferol (VITAMIN D3) 2000 UNITS capsule Take 1 capsule (2,000 Units total) by mouth daily. 100 capsule 3  . clopidogrel (PLAVIX) 75 MG tablet TAKE 1 TABLET BY MOUTH EVERY DAY WITH BREAKFAST 90 tablet 3  . FLUoxetine (PROZAC) 40 MG capsule TAKE 1 CAPSULE BY MOUTH TWICE DAILY 180 capsule 3  . fluticasone (FLONASE) 50 MCG/ACT nasal spray INSTILL 2 SPRAYS IN EACH NOSTRIL EVERY DAY 16 g 11  . gabapentin (NEURONTIN) 300 MG capsule TAKE 1 CAPSULE(300 MG) BY MOUTH TWICE DAILY 180 capsule 1  . HYDROmorphone (DILAUDID) 2 MG tablet Take 1 tablet (2 mg total) by mouth every 8 (eight) hours as needed for severe pain. 90 tablet 0  . lidocaine-prilocaine (EMLA) cream Apply 1 application topically as needed. 30 g 5  . losartan-hydrochlorothiazide (HYZAAR) 50-12.5 MG tablet TAKE 1 TABLET BY MOUTH DAILY 90 tablet 3  . Melatonin 5 MG TABS Take 5 mg by mouth at bedtime. Takes 10 mg at HS    . metoprolol tartrate (LOPRESSOR) 25 MG tablet TAKE 1 TABLET (25 MG TOTAL) BY MOUTH 2 (TWO) TIMES DAILY. 180 tablet 3  . Omega-3 Fatty Acids (FISH OIL OMEGA-3) 1000 MG CAPS Take 1,000 mg by mouth daily.     . ondansetron (ZOFRAN) 8 MG tablet Take 1 tablet (8 mg total) by mouth 3 (three) times daily. 90 tablet 3  . pantoprazole (PROTONIX) 40 MG tablet TAKE 1 TABLET (40 MG TOTAL) BY MOUTH EVERY EVENING. 90 tablet 3  . pravastatin  (PRAVACHOL) 20 MG tablet Take 1 tablet (20 mg total) by mouth daily. (Patient taking differently: Take 20 mg by mouth every evening. ) 90 tablet 1  . predniSONE (DELTASONE) 5 MG tablet TAKE 2 TABLETS (10 MG TOTAL) BY MOUTH DAILY WITH BREAKFAST. 180 tablet 3  . prochlorperazine (COMPAZINE) 10 MG tablet TAKE 1 TABLET(10 MG) BY MOUTH EVERY 6 HOURS AS NEEDED FOR NAUSEA OR VOMITING 385 tablet 1  . tadalafil (CIALIS) 5 MG tablet Take 5 mg by mouth as needed.    . torsemide (DEMADEX) 10 MG tablet Take 1-2 tablets (10-20 mg total) by mouth daily. (Patient taking differently: Take 10-20 mg by mouth as needed. ) 60 tablet 3  . traZODone (DESYREL) 100 MG tablet TAKE 1 TABLET AT BEDTIME 90 tablet 3  . vitamin B-12 (CYANOCOBALAMIN) 1000 MCG tablet Take 1,000 mcg by mouth daily.     [name] alfuzosin (UROXATRAL) 10 MG 24 hr tablet Take 10 mg by mouth at bedtime.  (Patient not taking: Reported on 09/16/2019)  0  . dronabinol (MARINOL) 5 MG capsule Take 1 capsule (5 mg total) by mouth 2 (two) times daily before a meal. (Patient not taking: Reported on 09/16/2019) 60 capsule 2  . Pyridoxine HCl (B-6 PO) Take 1 tablet by mouth. (Patient not taking: Reported on 09/16/2019)    . HYDROmorphone (DILAUDID) 2  MG tablet Take 1 tablet (2 mg total) by mouth every 8 (eight) hours as needed for severe pain. 90 tablet 0  . HYDROmorphone (DILAUDID) 2 MG tablet Take 1 tablet (2 mg total) by mouth every 8 (eight) hours as needed for severe pain. 90 tablet 0   No facility-administered medications prior to visit.    ROS: Review of Systems  Constitutional: Negative for appetite change, fatigue and unexpected weight change.  HENT: Negative for congestion, nosebleeds, sneezing, sore throat and trouble swallowing.   Eyes: Negative for itching and visual disturbance.  Respiratory: Positive for shortness of breath. Negative for cough.   Cardiovascular: Negative for chest pain, palpitations and leg swelling.  Gastrointestinal: Negative for  abdominal distention, blood in stool, diarrhea and nausea.  Genitourinary: Negative for frequency and hematuria.  Musculoskeletal: Positive for arthralgias, back pain and gait problem. Negative for joint swelling and neck pain.  Skin: Negative for rash.  Neurological: Negative for dizziness, tremors, speech difficulty and weakness.  Psychiatric/Behavioral: Negative for agitation, dysphoric mood, sleep disturbance and suicidal ideas. The patient is nervous/anxious.     Objective:  BP 114/68 (BP Location: Right Arm, Patient Position: Sitting, Cuff Size: Normal)   Pulse (!) 57   Temp 98.3 F (36.8 C) (Oral)   Ht 5' 6"  (1.676 m)   Wt 143 lb (64.9 kg)   SpO2 98%   BMI 23.08 kg/m   BP Readings from Last 3 Encounters:  09/16/19 114/68  08/30/19 116/64  07/19/19 133/68    Wt Readings from Last 3 Encounters:  09/16/19 143 lb (64.9 kg)  07/19/19 144 lb (65.3 kg)  06/15/19 144 lb (65.3 kg)    Physical Exam Constitutional:      General: He is not in acute distress.    Appearance: He is well-developed.     Comments: NAD  Eyes:     Conjunctiva/sclera: Conjunctivae normal.     Pupils: Pupils are equal, round, and reactive to light.  Neck:     Thyroid: No thyromegaly.     Vascular: No JVD.  Cardiovascular:     Rate and Rhythm: Normal rate and regular rhythm.     Heart sounds: Normal heart sounds. No murmur heard.  No friction rub. No gallop.   Pulmonary:     Effort: Pulmonary effort is normal. No respiratory distress.     Breath sounds: Normal breath sounds. No wheezing or rales.  Chest:     Chest wall: No tenderness.  Abdominal:     General: Bowel sounds are normal. There is no distension.     Palpations: Abdomen is soft. There is no mass.     Tenderness: There is no abdominal tenderness. There is no guarding or rebound.  Musculoskeletal:        General: Tenderness and deformity present. Normal range of motion.     Cervical back: Normal range of motion.  Lymphadenopathy:      Cervical: No cervical adenopathy.  Skin:    General: Skin is warm and dry.     Findings: No rash.  Neurological:     Mental Status: He is alert and oriented to person, place, and time.     Cranial Nerves: No cranial nerve deficit.     Motor: Weakness present. No abnormal muscle tone.     Coordination: Coordination abnormal.     Gait: Gait abnormal.     Deep Tendon Reflexes: Reflexes are normal and symmetric.  Psychiatric:        Behavior: Behavior normal.  Thought Content: Thought content normal.        Judgment: Judgment normal.   B hands, feet RA deformities  Lab Results  Component Value Date   WBC 5.8 07/19/2019   HGB 12.2 (L) 07/19/2019   HCT 35.6 (L) 07/19/2019   PLT 229 07/19/2019   GLUCOSE 98 07/19/2019   CHOL 128 03/10/2015   TRIG 95.0 03/10/2015   HDL 39.30 03/10/2015   LDLCALC 70 03/10/2015   ALT 8 07/19/2019   AST 10 (L) 07/19/2019   NA 128 (L) 07/19/2019   K 3.3 (L) 07/19/2019   CL 92 (L) 07/19/2019   CREATININE 0.91 07/19/2019   BUN 17 07/19/2019   CO2 31 07/19/2019   TSH 0.680 09/18/2017   PSA 1.95 10/15/2010   INR 1.1 (H) 06/14/2014   HGBA1C 6.0 09/28/2015    NM PET Image Restage (PS) Whole Body  Result Date: 07/14/2018 CLINICAL DATA:  Subsequent treatment strategy for restaging of melanoma. EXAM: NUCLEAR MEDICINE PET WHOLE BODY TECHNIQUE: 6.8 mCi F-18 FDG was injected intravenously. Full-ring PET imaging was performed from the skull base to thigh after the radiotracer. CT data was obtained and used for attenuation correction and anatomic localization. Fasting blood glucose: 81 mg/dl COMPARISON:  04/03/2018.  Clinic note of 06/11/2018 FINDINGS: Mediastinal blood pool activity: SUV max 1.8 HEAD/NECK: No abnormal intracranial or cervical nodal hypermetabolism. Incidental CT findings: Bilateral carotid atherosclerosis. No cervical adenopathy. CHEST: Development of relatively mild bilateral, upper lobe predominant hypermetabolic ground-glass opacities.  Example in the right upper lobe at on the order of 2.8 cm and a S.U.V. max of 6.3 on image 26/8. Within the anterior left upper lobe at a S.U.V. max of 4.4 on image 33/8. No thoracic nodal hypermetabolism. Incidental CT findings: Right hemidiaphragm elevation. Mild cardiomegaly with aortic and coronary artery atherosclerosis. Left Port-A-Cath tip at mid SVC. Pulmonary artery enlargement, outflow tract 4.0 cm. Beam hardening artifact from bilateral shoulder arthroplasty. ABDOMEN/PELVIS: No abdominopelvic nodal or parenchymal hypermetabolism identified. Incidental CT findings: Beam hardening artifact from lumbar spine fixation. Grossly normal adrenal glands. Mild renal cortical thinning bilaterally. Low-density bilateral renal lesions are difficult to directly quantify secondary to beam hardening artifact from adjacent lumbar spine hardware. Likely cysts based on morphology. Grossly similar to on the prior. Abdominal aortic atherosclerosis. Mild prostatomegaly. Right larger than left hydroceles. SKELETON: No abnormal marrow activity. Incidental CT findings: none EXTREMITIES: No abnormal activity within the extremities. 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 Surgical change about both feet and the right ankle. IMPRESSION: 1. No findings of residual or recurrent hypermetabolic melanoma. 2. Development of bilateral hypermetabolic upper lobe predominant ground-glass opacities. Favor drug toxicity. Given absence of fever or shortness of breath on prior clinic note of 06/11/2018, atypical or viral infectious process is felt less likely. 3. Multifactorial degradation as detailed above. 4. Coronary artery atherosclerosis. Aortic Atherosclerosis (ICD10-I70.0). 5. Pulmonary artery enlargement suggests pulmonary arterial hypertension. These results will be called to the ordering clinician or representative by the Radiologist Assistant, and communication documented in the PACS or z[employer]ision  Dashboard. Electronically Signed   By: Abigail Miyamoto M.D.   On: 07/14/2018 16:54    Assessment & Plan:   There are no diagnoses linked to this encounter.   No orders of the defined types were placed in this encounter.    Follow-up: No follow-ups on file.  [name], MD

## 2019-09-16 NOTE — Assessment & Plan Note (Signed)
In NSR

## 2019-09-16 NOTE — Assessment & Plan Note (Signed)
F/u w/Dr Marin Olp

## 2019-09-16 NOTE — Assessment & Plan Note (Signed)
Lopressor and Losartan HCT

## 2019-09-20 DIAGNOSIS — Z20828 Contact with and (suspected) exposure to other viral communicable diseases: Secondary | ICD-10-CM | POA: Diagnosis not present

## 2019-09-20 DIAGNOSIS — Z1159 Encounter for screening for other viral diseases: Secondary | ICD-10-CM | POA: Diagnosis not present

## 2019-09-30 ENCOUNTER — Other Ambulatory Visit: Payer: Self-pay

## 2019-09-30 ENCOUNTER — Ambulatory Visit (INDEPENDENT_AMBULATORY_CARE_PROVIDER_SITE_OTHER): Payer: Medicare Other

## 2019-09-30 VITALS — BP 116/70 | HR 60 | Temp 98.2°F | Resp 16 | Ht 66.0 in | Wt 144.6 lb

## 2019-09-30 DIAGNOSIS — Z0000 Encounter for general adult medical examination without abnormal findings: Secondary | ICD-10-CM

## 2019-09-30 NOTE — Progress Notes (Signed)
Subjective:   [name] is a 74 y.o. male who presents for Medicare Annual/Subsequent preventive examination.  Review of Systems    No ROS. Medicare Wellness Visit. Additional risk factors are reflected in social history. Cardiac Risk Factors include: advanced age (>73mn, >>3women);family history of premature cardiovascular disease;hypertension;male gender     Objective:    Today's Vitals   09/30/19 1108 09/30/19 1250  BP: 116/70   Pulse: 60   Resp: 16   Temp: 98.2 F (36.8 C)   SpO2: 95%   Weight: 144 lb 9.6 oz (65.6 kg)   Height: 5' 6"  (1.676 m)   PainSc: 6  6   PainLoc: Back    Body mass index is 23.34 kg/m.  Advanced Directives 09/30/2019 07/19/2019 04/19/2019 01/11/2019 10/21/2018 08/20/2018 08/19/2018  Does Patient Have a Medical Advance Directive? Yes Yes Yes Yes Yes No Yes  Type of Advance Directive - H[place]ado[place] LakeLiving will HCotton PlantLiving will HSt. Regis FallsLiving will HMeadowview EstatesLiving will - HLake ForestLiving will  Does patient want to make changes to medical advance directive? No - Patient declined No - Patient declined No - Patient declined No - Patient declined No - Patient declined Yes (ED - Information included in AVS) -  Copy of [place]in Chart? - - - - No - copy requested - Yes - validated most recent copy scanned in chart (See row information)  Would patient like information on creating a medical advance directive? - - - - No - Patient declined - -  Pre-existing out of facility DNR order (yellow form or pink MOST form) - - - - - - -    Current Medications (verified) Outpatient Encounter Medications as of 09/30/2019  Medication Sig  . albuterol (PROVENTIL HFA;VENTOLIN HFA) 108 (90 Base) MCG/ACT inhaler Inhale 1-2 puffs into the lungs every 4 (four) hours as needed for wheezing or shortness of breath.  . alfuzosin (UROXATRAL) 10 MG 24 hr  tablet Take 10 mg by mouth at bedtime.  (Patient not taking: Reported on 09/16/2019)  . ALPRAZolam (XANAX) 0.5 MG tablet Take 1 tablet (0.5 mg total) by mouth 2 (two) times daily as needed. for anxiety  . Cholecalciferol (VITAMIN D3) 2000 UNITS capsule Take 1 capsule (2,000 Units total) by mouth daily.  . clopidogrel (PLAVIX) 75 MG tablet TAKE 1 TABLET BY MOUTH EVERY DAY WITH BREAKFAST  . FLUoxetine (PROZAC) 40 MG capsule TAKE 1 CAPSULE BY MOUTH TWICE DAILY  . fluticasone (FLONASE) 50 MCG/ACT nasal spray INSTILL 2 SPRAYS IN EACH NOSTRIL EVERY DAY  . gabapentin (NEURONTIN) 300 MG capsule TAKE 1 CAPSULE(300 MG) BY MOUTH TWICE DAILY  . HYDROmorphone (DILAUDID) 2 MG tablet Take 1 tablet (2 mg total) by mouth every 8 (eight) hours as needed for severe pain.  .Marland KitchenHYDROmorphone (DILAUDID) 2 MG tablet Take 1 tablet (2 mg total) by mouth every 8 (eight) hours as needed for severe pain.  .Marland KitchenHYDROmorphone (DILAUDID) 2 MG tablet Take 1 tablet (2 mg total) by mouth every 8 (eight) hours as needed for severe pain.  .Marland Kitchenlidocaine-prilocaine (EMLA) cream Apply 1 application topically as needed.  .Marland Kitchenlosartan-hydrochlorothiazide (HYZAAR) 50-12.5 MG tablet TAKE 1 TABLET BY MOUTH DAILY  . Melatonin 5 MG TABS Take 5 mg by mouth at bedtime. Takes 10 mg at HS  . metoprolol tartrate (LOPRESSOR) 25 MG tablet TAKE 1 TABLET (25 MG TOTAL) BY MOUTH 2 (TWO) TIMES DAILY.  .[name]  Omega-3 Fatty Acids (FISH OIL OMEGA-3) 1000 MG CAPS Take 1,000 mg by mouth daily.   . ondansetron (ZOFRAN) 8 MG tablet Take 1 tablet (8 mg total) by mouth 3 (three) times daily.  . pantoprazole (PROTONIX) 40 MG tablet TAKE 1 TABLET (40 MG TOTAL) BY MOUTH EVERY EVENING.  . pravastatin (PRAVACHOL) 20 MG tablet Take 1 tablet (20 mg total) by mouth daily. (Patient taking differently: Take 20 mg by mouth every evening. )  . predniSONE (DELTASONE) 5 MG tablet TAKE 2 TABLETS (10 MG TOTAL) BY MOUTH DAILY WITH BREAKFAST.  [name] prochlorperazine (COMPAZINE) 10 MG tablet TAKE 1  TABLET(10 MG) BY MOUTH EVERY 6 HOURS AS NEEDED FOR NAUSEA OR VOMITING  . Pyridoxine HCl (B-6 PO) Take 1 tablet by mouth. (Patient not taking: Reported on 09/16/2019)  . tadalafil (CIALIS) 5 MG tablet Take 5 mg by mouth as needed.  . torsemide (DEMADEX) 10 MG tablet Take 1-2 tablets (10-20 mg total) by mouth daily. (Patient taking differently: Take 10-20 mg by mouth as needed. )  . traZODone (DESYREL) 100 MG tablet TAKE 1 TABLET AT BEDTIME  . vitamin B-12 (CYANOCOBALAMIN) 1000 MCG tablet Take 1,000 mcg by mouth daily.    No facility-administered encounter medications on file as of 09/30/2019.    Allergies (verified) Cefepime, Cephaeline, Morphine, Morphine and related, Penicillins, Doxycycline, and Oxycodone-acetaminophen   History: Past Medical History:  Diagnosis Date  . Alcoholism /alcohol abuse ([place])    per family  . Allergic rhinitis   . Allergy   . Anxiety   . Bacterial infection   . CAD (coronary artery disease)    minimal coronary plaque in the LAD and right coronary system. 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 7-06. Last nuclear stress 11-17-06 fixed anterior/ inferior defect, no inducible ischemia, EF 81%  . Cataract   . CHF (congestive heart failure) ([place])   . Chronic back pain    "all over back"  . Chronic neck pain   . COPD (chronic obstructive pulmonary disease) ([place])   . Depression   . Diverticulosis   . Dyspnea    with exertion  . Dysrhythmia 01-24-12   past hx. A.Fib x1 episode-responded to med.  . Falls frequently    "since 02/2013" (06/16/2013)  . Family history of cancer   . Genetic testing 03/06/2017   Multi-Cancer panel (83 genes) @ [facility] - No pathogenic mutations detected  . GERD (gastroesophageal reflux disease)   . Hemochromatosis    dx'd 14 yrs ago last ferritin Aug 11, 08 52 (22-322), Fe 136 ("I had 250 phlebotomies for that")  . High cholesterol    hx  . Hx of colonic polyps     . Hx of colonoscopy   . Hypertension   . Iron deficiency anemia due to chronic blood loss 12/27/2016  . Malignant melanoma of knee, left ([place])   . Myocardial infarction [facility]) 2006   "related to catheterization"  . Narcotic abuse ([place])    per family  . Osteoarthritis   . PONV (postoperative nausea and vomiting)   . RA (rheumatoid arthritis) ([place])    Past Surgical History:  Procedure Laterality Date  . ABDOMINAL ADHESION SURGERY  ~ 1968  . ANKLE RECONSTRUCTION Right 6-09   [facility]  . APPENDECTOMY  ~ 1956  . ASPIRATION OF ABSCESS Left 03/27/2017   Procedure: ASPIRATION OF SEROMA;  Surgeon: Stark Klein, MD;  Location: [place];  Service: General;  Laterality: Left;  .  BONE TUMOR RESECTION  ~ 1954   "taken off my mastoid"  . CARPAL TUNNEL RELEASE Right 1990's  . CATARACT EXTRACTION, BILATERAL Bilateral 01-24-12  . CORONARY ANGIOPLASTY WITH STENT PLACEMENT  2006   "while repairing 1st stent, a second area tore and they had to place 2nd stent " ?LAD & CX  . CORONARY ANGIOPLASTY WITH STENT PLACEMENT  06/17/2014  . CYST EXCISION  "several OR's"   "backX 2, back of my neck, face, inside right bicept, chest, wrist"  . FOOT SURGERY Right 11-08   for removal of bone spurs-  . HAMMER TOE SURGERY Right 07/2012   "broke 4 hammertoes"   . HARDWARE REMOVAL  03/09/2012   Procedure: HARDWARE REMOVAL;  Surgeon: Nita Sells, MD;  Location: [place];  Service: Orthopedics;  Laterality: Left;  Hardware Removal from Left Shoulder  . HARVEST BONE GRAFT  02/06/2012   Procedure: HARVEST ILIAC BONE GRAFT;  Surgeon: Nita Sells, MD;  Location: [facility];  Service: Orthopedics;;  bone marrow aspirqation   . INGUINAL HERNIA REPAIR Bilateral   . JOINT REPLACEMENT    . KNEE SURGERY Left ~ 2003   "6-12 months after uni knee removed synovial sack"  . LEFT HEART CATHETERIZATION WITH CORONARY ANGIOGRAM N/A 06/17/2014   PCI of diffuse severe stenosis in the  proximal to mid LAD using overlapping drug-eluting stents.  . LUMBAR WOUND DEBRIDEMENT N/A 03/17/2013   Procedure: Incision and drainage of superficial lumbar wound;  Surgeon: Floyce Stakes, MD;  Location: [place] [facility];  Service: Neurosurgery;  Laterality: N/A;  Incision and drainage of superficial lumbar wound  . MECKEL DIVERTICULUM EXCISION  ~ 1956  . MELANOMA EXCISION WITH SENTINEL LYMPH NODE BIOPSY Left 02/12/2017   WIDE LOCAL EXCISION LEFT KNEE MELANOMA, ADVANCEMENT FLAP CLOSURE, AND SENTINEL LYMPH NODE MAPPING AND BIOPSY.  [name] MELANOMA EXCISION WITH SENTINEL LYMPH NODE BIOPSY Left 02/12/2017   Procedure: WIDE LOCAL EXCISION LEFT KNEE MELANOMA, ADVANCEMENT FLAP CLOSURE, AND SENTINEL LYMPH NODE MAPPING AND BIOPSY.;  Surgeon: Stark Klein, MD;  Location: [place];  Service: General;  Laterality: Left;  GENERAL AND LOCAL  . ORIF SHOULDER FRACTURE  02/06/2012   Procedure: OPEN REDUCTION INTERNAL FIXATION (ORIF) SHOULDER FRACTURE;  Surgeon: Nita Sells, MD;  [facility];  Service: Orthopedics;  Laterality: Left;  ORIF of a Left Shoulder Fracture with  Iliac Crest Bone Graft aspiration   . PORTACATH PLACEMENT Left 03/27/2017   Procedure: INSERTION PORT-A-CATH;  Surgeon: Stark Klein, MD;  Location: [facility];  Service: General;  Laterality: Left;  . POSTERIOR LUMBAR FUSION  12-10   L4-5 diskectomy w/ fusion, cage placement and rods; [place]  . POSTERIOR LUMBAR FUSION 4 LEVEL N/A 03/02/2013   Procedure: Lumbar One to Sacral One Posterior lumbar interbody fusion;  Surgeon: Floyce Stakes, MD;  Location: [place] [facility];  Service: Neurosurgery;  Laterality: N/A;  L1 to S1 Posterior lumbar interbody fusion  . REPLACEMENT UNICONDYLAR JOINT KNEE Left ~ 2003   "~ 6 months after total knee replaced"  . SHOULDER ARTHROSCOPY Left ~ 2004 X 2   "@ [facility]; left bone splinter in & had to clean it out"  . TOTAL ANKLE REPLACEMENT Right 2008   at [facility]  . TOTAL KNEE ARTHROPLASTY  Bilateral 2002  . TOTAL SHOULDER REPLACEMENT Left 2006  . TOTAL SHOULDER REPLACEMENT Right ~ 2007   Dr. Marlou Sa   Family History  Problem Relation Age of Onset  . Uterine cancer Mother  uterine vs. cervix? dx 71s; deceased 66  . Macular degeneration Mother   . Other Mother        ankle edema  . Lung cancer Mother   . Melanoma Mother        multiple spots on legs  . Cancer - Lung Mother        smoker  . Coronary artery disease Father   . Hypertension Father   . Prostate cancer Father   . Colon polyps Father   . Cancer - Prostate Father 91       deceased 12  . Heart attack Brother   . Hyperlipidemia Brother   . Other Brother        Schizophrenic  . Thyroid disease Sister   . Hemochromatosis Sister   . Rheum arthritis Sister   . Coronary artery disease Maternal Aunt   . Heart attack Maternal Aunt   . Cancer Maternal Grandmother        unk. type; deceased 36s  . Diabetes Neg Hx   . Colon cancer Neg Hx   . Esophageal cancer Neg Hx   . Rectal cancer Neg Hx   . Stomach cancer Neg Hx    Social History   Socioeconomic History  . Marital status: Significant Other    Spouse name: Not on file  . Number of children: 2  . Years of education: 56  . Highest education level: Not on file  Occupational History  . 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    Comment: self employed  Tobacco Use  . Smoking status: Never Smoker  . Smokeless tobacco: Never Used  Vaping Use  . Vaping Use: Never used  Substance and Sexual Activity  . Alcohol use: No    Alcohol/week: 0.0 standard drinks    Comment: "I do not drink anymore"  . Drug use: No  . Sexual activity: Yes    Partners: Female  Other Topics Concern  . Not on file  Social History Narrative   [employer] [place]. Married 1972-89yr seperated/divorced. Engaged April '11. 1 son '74; step-daughter '70. 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 During '11 discovered an [place]- $500,000+ loss. Is handling this OK - will keep the business running.   Social Determinants of Health   Financial Resource Strain:   . Difficulty of Paying Living Expenses:   Food Insecurity:   . Worried About RCharity fundraiserin the Last Year:   . RArboriculturistin the Last Year:   Transportation Needs:   . LFilm/video editor(Medical):   .Marland KitchenLack of Transportation (Non-Medical):   Physical Activity:   . Days of Exercise per Week:   . Minutes of Exercise per Session:   Stress:   . Feeling of Stress :   Social Connections:   . Frequency of Communication with Friends and Family:   . Frequency of Social Gatherings with Friends and Family:   . Attends Religious Services:   . Active Member of Clubs or Organizations:   . Attends CArchivistMeetings:   .Marland KitchenMarital Status:     Tobacco Counseling Counseling given: No   Clinical Intake:  Pre-visit preparation completed: Yes  Pain : 0-10 Pain Score: 6  Pain Type: Chronic pain Pain Location: Back Pain Orientation: Lower Pain Descriptors / Indicators: Discomfort, Aching, Pressure Pain Onset: More than a  month ago Pain Frequency: Constant Pain Relieving Factors: Pain Medication  Pain Relieving Factors: Pain Medication  BMI - recorded: 23.34 Nutritional Status: BMI of 19-24  Normal Nutritional Risks: None Diabetes: No  How often do you need to have someone help you when you read instructions, pamphlets, or other written materials from your doctor or pharmacy?: 1 - Never What is the last grade level you completed in school?: [employer]  Diabetic? No  Interpreter Needed?: No  Information entered by :: Angely Dietz N. Keydi Giel, LPN   Activities of Daily Living In your present state of health, do you have any difficulty performing the following activities: 09/30/2019  Hearing? N  Vision? N  Difficulty concentrating or  making decisions? N  Walking or climbing stairs? N  Dressing or bathing? N  Doing errands, shopping? N  Preparing Food and eating ? N  Using the Toilet? N  In the past six months, have you accidently leaked urine? N  Do you have problems with loss of bowel control? N  Managing your Medications? N  Managing your Finances? N  Housekeeping or managing your Housekeeping? N  Some recent data might be hidden    Patient Care Team: Plotnikov, Evie Lacks, MD as PCP - General (Internal Medicine) Josue Hector, MD as PCP - Cardiology (Cardiology) Jackolyn Confer, MD as Consulting Physician (General Surgery) Star Age, MD as Attending Physician (Neurology) Comer, Okey Regal, MD as Consulting Physician (Infectious Diseases) Leeroy Cha, MD as Consulting Physician (Neurosurgery) Milus Banister, MD as Attending Physician (Gastroenterology) [name], MD as Consulting Physician (Oncology)  Indicate any recent Medical Services you may have received from other than [facility] providers in the past year (date may be approximate).     Assessment:   This is a routine wellness examination for [name].  Hearing/Vision screen No exam data present  Dietary issues and exercise activities discussed: Current Exercise Habits: The patient does not participate in regular exercise at present, Exercise limited by: neurologic condition(s);orthopedic condition(s)  Goals    . Patient Stated    . Patient Stated     Continue to work towards getting stronger and healthier by staying physically and socially active and by eating healthy.      Depression Screen PHQ 2/9 Scores 09/30/2019 08/20/2018 08/18/2017 08/07/2016 08/07/2016 10/26/2015 12/10/2013  PHQ - 2 Score 0 1 1 0 0 0 0  PHQ- 9 Score - 3 6 - - - -    Fall Risk Fall Risk  09/30/2019 08/20/2018 08/18/2017 08/07/2016 10/26/2015  Falls in the past year? 0 1 No No No  Number falls in past yr: 0 1 - - -  Injury with Fall? 0 1 - - -  Risk Factor Category  -  - - - -  Risk for fall due to : Impaired balance/gait;Orthopedic patient History of fall(s);Impaired mobility;Impaired balance/gait - - -  Risk for fall due to: Comment - Just finish 5 weeks of OT and PT - - -  Follow up Falls evaluation completed;Education provided - - - -    Any stairs in or around the home? Yes  If so, are there any without handrails? No  Home free of loose throw rugs in walkways, pet beds, electrical cords, etc? Yes  Adequate lighting in your home to reduce risk of falls? Yes   ASSISTIVE DEVICES UTILIZED TO PREVENT FALLS:  Life alert? Yes  Use of a cane, walker or w/c? No  Grab bars in the bathroom? Yes  Shower  chair or bench in shower? Yes  Elevated toilet seat or a handicapped toilet? Yes   TIMED UP AND GO:  Was the test performed? Yes .  Length of time to ambulate 10 feet: 5 sec.   Gait steady and fast without use of assistive device  Cognitive Function: MMSE - Mini Mental State Exam 08/18/2017  Orientation to time 5  Orientation to Place 5  Registration 3  Attention/ Calculation 5  Recall 2  Language- name 2 objects 2  Language- repeat 1  Language- follow 3 step command 3  Language- read & follow direction 1  Write a sentence 1  Copy design 1  Total score 29     6CIT Screen 09/30/2019  What Year? 0 points  What month? 0 points  What time? 3 points  Count back from 20 0 points  Months in reverse 0 points  Repeat phrase 2 points  Total Score 5    Immunizations Immunization History  Administered Date(s) Administered  . Fluad Quad(high Dose 65+) 12/21/2018  . Influenza Whole 01/01/2008, 01/10/2009, 01/10/2010, 03/16/2012  . Influenza, High Dose Seasonal PF 12/17/2016, 12/23/2017  . Influenza,inj,Quad PF,6+ Mos 01/05/2013, 12/10/2013, 12/05/2014  . Influenza-Unspecified 12/25/2015  . Pneumococcal Conjugate-13 12/31/2013  . Pneumococcal Polysaccharide-23 01/01/2008, 03/10/2015  . Tdap 07/23/2011  . Zoster 04/06/2012    TDAP status: Up  to date Flu Vaccine status: Up to date Pneumococcal vaccine status: Up to date Covid-19 vaccine status: Completed vaccines  Qualifies for Shingles Vaccine? Yes   Zostavax completed Yes   Shingrix Completed?: No.    Education has been provided regarding the importance of this vaccine. Patient has been advised to call insurance company to determine out of pocket expense if they have not yet received this vaccine. Advised may also receive vaccine at local pharmacy or Health Dept. Verbalized acceptance and understanding.  Screening Tests Health Maintenance  Topic Date Due  . Hepatitis C Screening  Never done  . COVID-19 Vaccine (1) Never done  . INFLUENZA VACCINE  11/07/2019  . TETANUS/TDAP  07/22/2021  . COLONOSCOPY  09/10/2021  . PNA vac Low Risk Adult  Completed    Health Maintenance  Health Maintenance Due  Topic Date Due  . Hepatitis C Screening  Never done  . COVID-19 Vaccine (1) Never done    Colorectal cancer screening: Completed 09/11/2018. Repeat every 3 years  Lung Cancer Screening: (Low Dose CT Chest recommended if Age 57-80 years, 30 pack-year currently smoking OR have quit w/in 15years.) does not qualify.   Lung Cancer Screening Referral: No  Additional Screening:  Hepatitis C Screening: does not qualify; Completed No  Vision Screening: Recommended annual ophthalmology exams for early detection of glaucoma and other disorders of the eye. Is the patient up to date with their annual eye exam?  Yes  Who is the provider or what is the name of the office in which the patient attends annual eye exams? [facility] If pt is not established with a provider, would they like to be referred to a provider to establish care? No .   Dental Screening: Recommended annual dental exams for proper oral hygiene  Community Resource Referral / Chronic Care Management: CRR required this visit?  No   CCM required this visit?  No      Plan:     I have personally  reviewed and noted the following in the patient's chart:   . Medical and social history . Use of alcohol, tobacco or illicit drugs  .  Current medications and supplements . Functional ability and status . Nutritional status . Physical activity . Advanced directives . List of other physicians . Hospitalizations, surgeries, and ER visits in previous 12 months . Vitals . Screenings to include cognitive, depression, and falls . Referrals and appointments  In addition, I have reviewed and discussed with patient certain preventive protocols, quality metrics, and best practice recommendations. A written personalized care plan for preventive services as well as general preventive health recommendations were provided to patient.     Sheral Flow, LPN   10/17/7869   Nurse Notes: Patient is cogitatively intact.

## 2019-09-30 NOTE — Patient Instructions (Signed)
[name] , Thank you for taking time to come for your Medicare Wellness Visit. I appreciate your ongoing commitment to your health goals. Please review the following plan we discussed and let me know if I can assist you in the future.   Screening recommendations/referrals: Colonoscopy: last done 09/11/2018 Recommended yearly ophthalmology/optometry visit for glaucoma screening and checkup Recommended yearly dental visit for hygiene and checkup  Vaccinations: Influenza vaccine: 12/21/2018 Pneumococcal vaccine: completed Tdap vaccine: 07/23/2011; due every 10 years (2023) Shingles vaccine: never done   Covid-19: completed Zoster vaccine: 04/06/2012  Advanced directives: Please bring a copy of your health care power of attorney and living will to the office at your convenience.  Conditions/risks identified: Please continue to do your personal lifestyle choices by: daily care of teeth and gums, regular physical activity (goal should be 5 days a week for 30 minutes), eat a healthy diet, avoid tobacco and drug use, limiting any alcohol intake, taking a low-dose aspirin (if not allergic or have been advised by your provider otherwise) and taking vitamins and minerals as recommended by your provider. Continue doing brain stimulating activities (puzzles, reading, adult coloring books, staying active) to keep memory sharp. Continue to eat heart healthy diet (full of fruits, vegetables, whole grains, lean protein, water--limit salt, fat, and sugar intake) and increase physical activity as tolerated.   Next appointment: Please schedule your next Medicare Wellness Visit with your Nurse Health Advisor in 1 year.  Preventive Care 72 Years and Older, Male Preventive care refers to lifestyle choices and visits with your health care provider that can promote health and wellness. What does preventive care include?  A yearly physical exam. This is also called an annual well check.  Dental exams once or  twice a year.  Routine eye exams. Ask your health care provider how often you should have your eyes checked.  Personal lifestyle choices, including:  Daily care of your teeth and gums.  Regular physical activity.  Eating a healthy diet.  Avoiding tobacco and drug use.  Limiting alcohol use.  Practicing safe sex.  Taking low doses of aspirin every day.  Taking vitamin and mineral supplements as recommended by your health care provider. What happens during an annual well check? The services and screenings done by your health care provider during your annual well check will depend on your age, overall health, lifestyle risk factors, and family history of disease. Counseling  Your health care provider may ask you questions about your:  Alcohol use.  Tobacco use.  Drug use.  Emotional well-being.  Home and relationship well-being.  Sexual activity.  Eating habits.  History of falls.  Memory and ability to understand (cognition).  Work and work Statistician. Screening  You may have the following tests or measurements:  Height, weight, and BMI.  Blood pressure.  Lipid and cholesterol levels. These may be checked every 5 years, or more frequently if you are over 7 years old.  Skin check.  Lung cancer screening. You may have this screening every year starting at age 62 if you have a 30-pack-year history of smoking and currently smoke or have quit within the past 15 years.  Fecal occult blood test (FOBT) of the stool. You may have this test every year starting at age 76.  Flexible sigmoidoscopy or colonoscopy. You may have a sigmoidoscopy every 5 years or a colonoscopy every 10 years starting at age 46.  Prostate cancer screening. Recommendations will vary depending on your family history and other risks.  Hepatitis C blood test.  Hepatitis B blood test.  Sexually transmitted disease (STD) testing.  Diabetes screening. This is done by checking your blood  sugar (glucose) after you have not eaten for a while (fasting). You may have this done every 1-3 years.  Abdominal aortic aneurysm (AAA) screening. You may need this if you are a current or former smoker.  Osteoporosis. You may be screened starting at age 1 if you are at high risk. Talk with your health care provider about your test results, treatment options, and if necessary, the need for more tests. Vaccines  Your health care provider may recommend certain vaccines, such as:  Influenza vaccine. This is recommended every year.  Tetanus, diphtheria, and acellular pertussis (Tdap, Td) vaccine. You may need a Td booster every 10 years.  Zoster vaccine. You may need this after age 35.  Pneumococcal 13-valent conjugate (PCV13) vaccine. One dose is recommended after age 13.  Pneumococcal polysaccharide (PPSV23) vaccine. One dose is recommended after age 80. Talk to your health care provider about which screenings and vaccines you need and how often you need them. This information is not intended to replace advice given to you by your health care provider. Make sure you discuss any questions you have with your health care provider. Document Released: 04/21/2015 Document Revised: 12/13/2015 Document Reviewed: 01/24/2015 Elsevier Interactive Patient Education  2017 [place] Prevention in the Home Falls can cause injuries. They can happen to people of all ages. There are many things you can do to make your home safe and to help prevent falls. What can I do on the outside of my home?  Regularly fix the edges of walkways and driveways and fix any cracks.  Remove anything that might make you trip as you walk through a door, such as a raised step or threshold.  Trim any bushes or trees on the path to your home.  Use bright outdoor lighting.  Clear any walking paths of anything that might make someone trip, such as rocks or tools.  Regularly check to see if handrails are loose or  broken. Make sure that both sides of any steps have handrails.  Any raised decks and porches should have guardrails on the edges.  Have any leaves, snow, or ice cleared regularly.  Use sand or salt on walking paths during winter.  Clean up any spills in your garage right away. This includes oil or grease spills. What can I do in the bathroom?  Use night lights.  Install grab bars by the toilet and in the tub and shower. Do not use towel bars as grab bars.  Use non-skid mats or decals in the tub or shower.  If you need to sit down in the shower, use a plastic, non-slip stool.  Keep the floor dry. Clean up any water that spills on the floor as soon as it happens.  Remove soap buildup in the tub or shower regularly.  Attach bath mats securely with double-sided non-slip rug tape.  Do not have throw rugs and other things on the floor that can make you trip. What can I do in the bedroom?  Use night lights.  Make sure that you have a light by your bed that is easy to reach.  Do not use any sheets or blankets that are too big for your bed. They should not hang down onto the floor.  Have a firm chair that has side arms. You can use this for support while you  get dressed.  Do not have throw rugs and other things on the floor that can make you trip. What can I do in the kitchen?  Clean up any spills right away.  Avoid walking on wet floors.  Keep items that you use a lot in easy-to-reach places.  If you need to reach something above you, use a strong step stool that has a grab bar.  Keep electrical cords out of the way.  Do not use floor polish or wax that makes floors slippery. If you must use wax, use non-skid floor wax.  Do not have throw rugs and other things on the floor that can make you trip. What can I do with my stairs?  Do not leave any items on the stairs.  Make sure that there are handrails on both sides of the stairs and use them. Fix handrails that are  broken or loose. Make sure that handrails are as long as the stairways.  Check any carpeting to make sure that it is firmly attached to the stairs. Fix any carpet that is loose or worn.  Avoid having throw rugs at the top or bottom of the stairs. If you do have throw rugs, attach them to the floor with carpet tape.  Make sure that you have a light switch at the top of the stairs and the bottom of the stairs. If you do not have them, ask someone to add them for you. What else can I do to help prevent falls?  Wear shoes that:  Do not have high heels.  Have rubber bottoms.  Are comfortable and fit you well.  Are closed at the toe. Do not wear sandals.  If you use a stepladder:  Make sure that it is fully opened. Do not climb a closed stepladder.  Make sure that both sides of the stepladder are locked into place.  Ask someone to hold it for you, if possible.  Clearly mark and make sure that you can see:  Any grab bars or handrails.  First and last steps.  Where the edge of each step is.  Use tools that help you move around (mobility aids) if they are needed. These include:  Canes.  Walkers.  Scooters.  Crutches.  Turn on the lights when you go into a dark area. Replace any light bulbs as soon as they burn out.  Set up your furniture so you have a clear path. Avoid moving your furniture around.  If any of your floors are uneven, fix them.  If there are any pets around you, be aware of where they are.  Review your medicines with your doctor. Some medicines can make you feel dizzy. This can increase your chance of falling. Ask your doctor what other things that you can do to help prevent falls. This information is not intended to replace advice given to you by your health care provider. Make sure you discuss any questions you have with your health care provider. Document Released: 01/19/2009 Document Revised: 08/31/2015 Document Reviewed: 04/29/2014 Elsevier  Interactive Patient Education  2017 [employer].

## 2019-10-01 ENCOUNTER — Other Ambulatory Visit: Payer: Self-pay | Admitting: Neurosurgery

## 2019-10-01 ENCOUNTER — Telehealth: Payer: Self-pay | Admitting: Nurse Practitioner

## 2019-10-01 DIAGNOSIS — G8929 Other chronic pain: Secondary | ICD-10-CM

## 2019-10-01 DIAGNOSIS — M545 Low back pain, unspecified: Secondary | ICD-10-CM

## 2019-10-01 DIAGNOSIS — M546 Pain in thoracic spine: Secondary | ICD-10-CM

## 2019-10-01 NOTE — Telephone Encounter (Signed)
Phone call to patient to verify medication list and allergies for myelogram procedure. Pt instructed to hold prozac, trazodone and compazine for 48hrs prior to myelogram appointment time. Pt verbalized understanding. Pre and post procedure instructions reviewed with pt.

## 2019-10-07 ENCOUNTER — Other Ambulatory Visit: Payer: Self-pay | Admitting: Internal Medicine

## 2019-10-14 ENCOUNTER — Ambulatory Visit
Admission: RE | Admit: 2019-10-14 | Discharge: 2019-10-14 | Disposition: A | Discharge status: Home / Self Care | Payer: Medicare Other | Source: Ambulatory Visit | Attending: Neurosurgery | Admitting: Neurosurgery

## 2019-10-14 ENCOUNTER — Other Ambulatory Visit: Payer: Self-pay

## 2019-10-14 VITALS — BP 126/55 | HR 59

## 2019-10-14 DIAGNOSIS — M546 Pain in thoracic spine: Secondary | ICD-10-CM

## 2019-10-14 DIAGNOSIS — M5126 Other intervertebral disc displacement, lumbar region: Secondary | ICD-10-CM | POA: Diagnosis not present

## 2019-10-14 DIAGNOSIS — M545 Low back pain, unspecified: Secondary | ICD-10-CM

## 2019-10-14 DIAGNOSIS — M5134 Other intervertebral disc degeneration, thoracic region: Secondary | ICD-10-CM | POA: Diagnosis not present

## 2019-10-14 DIAGNOSIS — G8929 Other chronic pain: Secondary | ICD-10-CM

## 2019-10-14 DIAGNOSIS — M961 Postlaminectomy syndrome, not elsewhere classified: Secondary | ICD-10-CM

## 2019-10-14 MED ORDER — DIAZEPAM 5 MG PO TABS: 5.0000 mg | ORAL_TABLET | Freq: Once | ORAL | Status: DC

## 2019-10-14 MED ORDER — ONDANSETRON HCL 4 MG/2ML IJ SOLN: 4.0000 mg | Freq: Four times a day (QID) | INTRAMUSCULAR | Status: DC | PRN

## 2019-10-14 MED ORDER — IOPAMIDOL (ISOVUE-M 300) INJECTION 61%: 10.0000 mL | Freq: Once | INTRAMUSCULAR | Status: DC | PRN

## 2019-10-14 NOTE — Discharge Instructions (Signed)
Myelogram Discharge Instructions  1. Go home and rest quietly for the next 24 hours.  It is important to lie flat for the next 24 hours.  Get up only to go to the restroom.  You may lie in the bed or on a couch on your back, your stomach, your left side or your right side.  You may have one pillow under your head.  You may have pillows between your knees while you are on your side or under your knees while you are on your back.  2. DO NOT drive today.  Recline the seat as far back as it will go, while still wearing your seat belt, on the way home.  3. You may get up to go to the bathroom as needed.  You may sit up for 10 minutes to eat.  You may resume your normal diet and medications unless otherwise indicated.  Drink lots of extra fluids today and tomorrow.  4. The incidence of headache, nausea, or vomiting is about 5% (one in 20 patients).  If you develop a headache, lie flat and drink plenty of fluids until the headache goes away.  Caffeinated beverages may be helpful.  If you develop severe nausea and vomiting or a headache that does not go away with flat bed rest, call [phone].  5. You may resume normal activities after your 24 hours of bed rest is over; however, do not exert yourself strongly or do any heavy lifting tomorrow. If when you get up you have a headache when standing, go back to bed and force fluids for another 24 hours.  6. Call your physician for a follow-up appointment.  The results of your myelogram will be sent directly to your physician by the following day.  7. If you have any questions or if complications develop after you arrive home, please call [phone].  Discharge instructions have been explained to the patient.  The patient, or the person responsible for the patient, fully understands these instructions.  YOU MAY RESTART YOUR PROZAC, TRAZODONE AND COMPAZINE TOMORROW 10/15/19 AT 09:30AM.

## 2019-10-14 NOTE — Progress Notes (Signed)
Patient states he has been off Compazine, Prozac and Trazodone for at least the past two days.

## 2019-10-18 DIAGNOSIS — Z20828 Contact with and (suspected) exposure to other viral communicable diseases: Secondary | ICD-10-CM | POA: Diagnosis not present

## 2019-10-18 DIAGNOSIS — Z1159 Encounter for screening for other viral diseases: Secondary | ICD-10-CM | POA: Diagnosis not present

## 2019-10-25 DIAGNOSIS — Z1159 Encounter for screening for other viral diseases: Secondary | ICD-10-CM | POA: Diagnosis not present

## 2019-10-25 DIAGNOSIS — Z20828 Contact with and (suspected) exposure to other viral communicable diseases: Secondary | ICD-10-CM | POA: Diagnosis not present

## 2019-10-29 ENCOUNTER — Encounter: Payer: Self-pay | Admitting: Hematology & Oncology

## 2019-10-29 ENCOUNTER — Inpatient Hospital Stay: Payer: Medicare Other | Attending: Hematology & Oncology

## 2019-10-29 ENCOUNTER — Inpatient Hospital Stay: Payer: Medicare Other

## 2019-10-29 ENCOUNTER — Other Ambulatory Visit: Payer: Self-pay | Admitting: Family

## 2019-10-29 ENCOUNTER — Other Ambulatory Visit: Payer: Self-pay

## 2019-10-29 ENCOUNTER — Inpatient Hospital Stay (HOSPITAL_BASED_OUTPATIENT_CLINIC_OR_DEPARTMENT_OTHER): Payer: Medicare Other | Admitting: Family

## 2019-10-29 VITALS — Wt 145.0 lb

## 2019-10-29 VITALS — BP 96/74 | HR 65 | Temp 98.3°F | Resp 18

## 2019-10-29 DIAGNOSIS — Z95828 Presence of other vascular implants and grafts: Secondary | ICD-10-CM

## 2019-10-29 DIAGNOSIS — C4372 Malignant melanoma of left lower limb, including hip: Secondary | ICD-10-CM

## 2019-10-29 DIAGNOSIS — D509 Iron deficiency anemia, unspecified: Secondary | ICD-10-CM | POA: Insufficient documentation

## 2019-10-29 DIAGNOSIS — Z85820 Personal history of malignant melanoma of skin: Secondary | ICD-10-CM | POA: Diagnosis not present

## 2019-10-29 DIAGNOSIS — D500 Iron deficiency anemia secondary to blood loss (chronic): Secondary | ICD-10-CM | POA: Diagnosis not present

## 2019-10-29 DIAGNOSIS — Z452 Encounter for adjustment and management of vascular access device: Secondary | ICD-10-CM | POA: Diagnosis not present

## 2019-10-29 LAB — CMP (CANCER CENTER ONLY)
ALT: 10 U/L (ref 0–44)
AST: 13 U/L — ABNORMAL LOW (ref 15–41)
Albumin: 4.2 g/dL (ref 3.5–5.0)
Alkaline Phosphatase: 64 U/L (ref 38–126)
Anion gap: 6 (ref 5–15)
BUN: 29 mg/dL — ABNORMAL HIGH (ref 8–23)
CO2: 28 mmol/L (ref 22–32)
Calcium: 9.3 mg/dL (ref 8.9–10.3)
Chloride: 93 mmol/L — ABNORMAL LOW (ref 98–111)
Creatinine: 1.18 mg/dL (ref 0.61–1.24)
GFR, Est AFR Am: >60 mL/min (ref >60)
GFR, Estimated: >60 mL/min (ref >60)
Glucose, Bld: 100 mg/dL — ABNORMAL HIGH (ref 70–99)
Potassium: 4 mmol/L (ref 3.5–5.1)
Sodium: 127 mmol/L — ABNORMAL LOW (ref 135–145)
Total Bilirubin: 0.6 mg/dL (ref 0.3–1.2)
Total Protein: 6.7 g/dL (ref 6.5–8.1)

## 2019-10-29 LAB — RETICULOCYTES
Immature Retic Fract: 5.7 % (ref 2.3–15.9)
RBC.: 3.96 MIL/uL — ABNORMAL LOW (ref 4.22–5.81)
Retic Count, Absolute: 80.4 10*3/uL (ref 19.0–186.0)
Retic Ct Pct: 2 % (ref 0.4–3.1)

## 2019-10-29 LAB — CBC WITH DIFFERENTIAL (CANCER CENTER ONLY)
Abs Immature Granulocytes: 0.13 10*3/uL — ABNORMAL HIGH (ref 0.00–0.07)
Basophils Absolute: 0 10*3/uL (ref 0.0–0.1)
Basophils Relative: 1 %
Eosinophils Absolute: 0 10*3/uL (ref 0.0–0.5)
Eosinophils Relative: 0 %
HCT: 36.8 % — ABNORMAL LOW (ref 39.0–52.0)
Hemoglobin: 12.3 g/dL — ABNORMAL LOW (ref 13.0–17.0)
Immature Granulocytes: 2 %
Lymphocytes Relative: 16 %
Lymphs Abs: 1.2 10*3/uL (ref 0.7–4.0)
MCH: 31 pg (ref 26.0–34.0)
MCHC: 33.4 g/dL (ref 30.0–36.0)
MCV: 92.7 fL (ref 80.0–100.0)
Monocytes Absolute: 0.6 10*3/uL (ref 0.1–1.0)
Monocytes Relative: 8 %
Neutro Abs: 5.8 10*3/uL (ref 1.7–7.7)
Neutrophils Relative %: 73 %
Platelet Count: 194 10*3/uL (ref 150–400)
RBC: 3.97 MIL/uL — ABNORMAL LOW (ref 4.22–5.81)
RDW: 12.6 % (ref 11.5–15.5)
WBC Count: 7.8 10*3/uL (ref 4.0–10.5)
nRBC: 0 % (ref 0.0–0.2)

## 2019-10-29 MED ORDER — HEPARIN SOD (PORK) LOCK FLUSH 100 UNIT/ML IV SOLN
500.0000 [IU] | Freq: Once | INTRAVENOUS | Status: AC
  Administered 2019-10-29: 500 [IU] via INTRAVENOUS
  Filled 2019-10-29: qty 5

## 2019-10-29 MED ORDER — SODIUM CHLORIDE 0.9% FLUSH
10.0000 mL | Freq: Once | INTRAVENOUS | Status: AC
  Administered 2019-10-29: 10 mL via INTRAVENOUS
  Filled 2019-10-29: qty 10

## 2019-10-29 NOTE — Progress Notes (Signed)
Hematology and Oncology Follow Up Visit  [name] [id number] December 01, 1945 74 y.o. 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 He is doing quite well.  He has chronic back pain and recently had CT scans of the lumbar and thoracic spine as well as a myelography. He is waiting for Dr. Arnoldo Morale to contact him with the results.  He would like to go back to lifting weights but is waiting on clearance.  He has noted that since finishing treatment his balance has improved.  No falls or syncopal episodes to report.  His ferritin in April was 88 and iron saturation 56%.  No fever, chills, n/v, cough, rash, SOB, chest pain, palpitations, abdominal pain or changes in bowel or bladder habits.  He has some lightheadedness after dinner if he gets up too quickly.  He has abdominal bloating and lots of gas after eating.  He has maintained a good appetite and is staying well hydrated. His weight is stable.  No swelling, numbness or tingling in his extremities at this time.   ECOG Performance Status: 1 - Symptomatic but completely ambulatory  Medications:  Allergies as of 10/29/2019      Reactions   Cefepime Hives, Shortness Of Breath   Cephaeline Hives   Morphine Swelling   A swollen stomach.   Morphine And Related Shortness Of Breath, Nausea And Vomiting, Swelling, Other (See Comments)   Agitation, tolerates dilaudid Other reaction(s): Other (See Comments) Agitation, tolerates dilaudid   Penicillins Hives, Shortness Of Breath, Rash   Has patient had a PCN reaction causing immediate rash,  facial/tongue/throat swelling, SOB or lightheadedness with hypotension: Yes Has patient had a PCN reaction causing severe rash involving mucus membranes or skin necrosis: Yes Has patient had a PCN reaction that required hospitalization No Has patient had a PCN reaction occurring within the last 10 years: No If all of the above answers are "NO", then may proceed with Cephalosporin use.   Doxycycline Rash   Oxycodone-acetaminophen Other (See Comments)   Patient doesn't remember what type of reaction.      Medication List       Accurate as of October 29, 2019 12:27 PM. If you have any questions, ask your nurse or doctor.        STOP taking these medications   alfuzosin 10 MG 24 hr tablet Commonly known as: UROXATRAL Stopped by: Laverna Peace, NP     TAKE these medications   albuterol 108 (90 Base) MCG/ACT inhaler Commonly known as: VENTOLIN HFA Inhale 1-2 puffs into the lungs every 4 (four) hours as needed for wheezing or shortness of breath.   ALPRAZolam 0.5 MG tablet Commonly known as: XANAX Take 1 tablet (0.5 mg total) by mouth 2 (two) times daily as needed. for anxiety   B-6 PO Take 1 tablet by mouth.   Cialis 5 MG tablet Generic drug: tadalafil Take 5 mg by mouth as needed.   clopidogrel 75 MG tablet Commonly known as: PLAVIX TAKE 1 TABLET BY MOUTH EVERY DAY WITH BREAKFAST   Fish Oil Omega-3 1000 MG Caps Take 1,000 mg by mouth daily.  FLUoxetine 40 MG capsule Commonly known as: PROZAC TAKE 1 CAPSULE BY MOUTH TWICE DAILY   fluticasone 50 MCG/ACT nasal spray Commonly known as: FLONASE INSTILL 2 SPRAYS IN EACH NOSTRIL EVERY DAY   gabapentin 300 MG capsule Commonly known as: NEURONTIN TAKE 1 CAPSULE(300 MG) BY MOUTH TWICE DAILY   HYDROmorphone 2 MG tablet Commonly known as: DILAUDID Take 1 tablet (2 mg total) by mouth every 8 (eight) hours as needed for severe pain.   lidocaine-prilocaine cream Commonly known as: EMLA Apply 1 application topically as  needed.   losartan-hydrochlorothiazide 50-12.5 MG tablet Commonly known as: HYZAAR TAKE 1 TABLET BY MOUTH DAILY   melatonin 5 MG Tabs Take 5 mg by mouth at bedtime. Takes 10 mg at HS   metoprolol tartrate 25 MG tablet Commonly known as: LOPRESSOR TAKE 1 TABLET (25 MG TOTAL) BY MOUTH 2 (TWO) TIMES DAILY.   ondansetron 8 MG tablet Commonly known as: ZOFRAN Take 1 tablet (8 mg total) by mouth 3 (three) times daily.   pantoprazole 40 MG tablet Commonly known as: PROTONIX TAKE 1 TABLET (40 MG TOTAL) BY MOUTH EVERY EVENING.   pravastatin 20 MG tablet Commonly known as: PRAVACHOL Take 1 tablet (20 mg total) by mouth daily.   predniSONE 5 MG tablet Commonly known as: DELTASONE TAKE 2 TABLETS (10 MG TOTAL) BY MOUTH DAILY WITH BREAKFAST. What changed: how much to take   prochlorperazine 10 MG tablet Commonly known as: COMPAZINE TAKE 1 TABLET(10 MG) BY MOUTH EVERY 6 HOURS AS NEEDED FOR NAUSEA OR VOMITING   torsemide 10 MG tablet Commonly known as: Demadex Take 1-2 tablets (10-20 mg total) by mouth daily.   traZODone 100 MG tablet Commonly known as: DESYREL TAKE 1 TABLET AT BEDTIME   vitamin B-12 1000 MCG tablet Commonly known as: CYANOCOBALAMIN Take 1,000 mcg by mouth daily.   Vitamin D3 50 MCG (2000 UT) capsule Take 1 capsule (2,000 Units total) by mouth daily.       Allergies:  Allergies  Allergen Reactions  . Cefepime Hives and Shortness Of Breath  . Cephaeline Hives  . Morphine Swelling    A swollen stomach.  . Morphine And Related Shortness Of Breath, Nausea And Vomiting, Swelling and Other (See Comments)    Agitation, tolerates dilaudid Other reaction(s): Other (See Comments) Agitation, tolerates dilaudid  . Penicillins Hives, Shortness Of Breath and Rash    Has patient had a PCN reaction causing immediate rash, facial/tongue/throat swelling, SOB or lightheadedness with hypotension: Yes Has patient had a PCN reaction causing severe rash involving mucus  membranes or skin necrosis: Yes Has patient had a PCN reaction that required hospitalization No Has patient had a PCN reaction occurring within the last 10 years: No If all of the above answers are "NO", then may proceed with Cephalosporin use.   [name] Doxycycline Rash  . Oxycodone-Acetaminophen Other (See Comments)    Patient doesn't remember what type of reaction.    Past Medical History, Surgical history, Social history, and Family History were reviewed and updated.  Review of Systems: All other 10 point review of systems is negative.   Physical Exam:  weight is 145 lb (65.8 kg).   Wt Readings from Last 3 Encounters:  10/29/19 145 lb (65.8 kg)  09/30/19 144 lb 9.6 oz (65.6 kg)  09/16/19 143 lb (64.9 kg)    Ocular: Sclerae unicteric, pupils equal, round and reactive to light Ear-nose-throat: Oropharynx clear, dentition fair Lymphatic: No cervical or supraclavicular adenopathy Lungs no rales or rhonchi, good  excursion bilaterally Heart regular rate and rhythm, no murmur appreciated Abd soft, nontender, positive bowel sounds, no liver or spleen tip palpated on exam, no fluid wave  MSK no focal spinal tenderness, no joint edema Neuro: non-focal, well-oriented, appropriate affect Breasts: Deferred   Lab Results  Component Value Date   WBC 7.8 10/29/2019   HGB 12.3 (L) 10/29/2019   HCT 36.8 (L) 10/29/2019   MCV 92.7 10/29/2019   PLT 194 10/29/2019   Lab Results  Component Value Date   FERRITIN 88 07/19/2019   IRON 101 07/19/2019   TIBC 181 (L) 07/19/2019   UIBC 80 (L) 07/19/2019   IRONPCTSAT 56 (H) 07/19/2019   Lab Results  Component Value Date   RETICCTPCT 2.0 10/29/2019   RBC 3.97 (L) 10/29/2019   RBC 3.96 (L) 10/29/2019   No results found for: KPAFRELGTCHN, LAMBDASER, KAPLAMBRATIO No results found for: IGGSERUM, IGA, IGMSERUM No results found for: Kathrynn Ducking, MSPIKE, SPEI   Chemistry      Component Value  Date/Time   NA 127 (L) 10/29/2019 1035   NA 140 04/10/2017 1046   NA 135 (L) 03/06/2017 1104   K 4.0 10/29/2019 1035   K 3.7 04/10/2017 1046   K 4.2 03/06/2017 1104   CL 93 (L) 10/29/2019 1035   CL 98 04/10/2017 1046   CO2 28 10/29/2019 1035   CO2 31 04/10/2017 1046   CO2 30 (H) 03/06/2017 1104   BUN 29 (H) 10/29/2019 1035   BUN 21 04/10/2017 1046   BUN 20.0 03/06/2017 1104   CREATININE 1.18 10/29/2019 1035   CREATININE 0.9 04/10/2017 1046   CREATININE 1.0 03/06/2017 1104      Component Value Date/Time   CALCIUM 9.3 10/29/2019 1035   CALCIUM 8.9 04/10/2017 1046   CALCIUM 9.4 03/06/2017 1104   ALKPHOS 64 10/29/2019 1035   ALKPHOS 65 04/10/2017 1046   ALKPHOS 78 03/06/2017 1104   AST 13 (L) 10/29/2019 1035   AST 20 03/06/2017 1104   ALT 10 10/29/2019 1035   ALT 21 04/10/2017 1046   ALT 20 03/06/2017 1104   BILITOT 0.6 10/29/2019 1035   BILITOT 0.47 03/06/2017 1104       Impression and Plan: [name] is a very pleasant 74 yo caucasian gentleman with history of hemochromatosis,homozygous for the C282Y mutation which has not been a problem for him so far.  Iron studies are pending. He is interested in doing a phlebotomy if his saturation has gone up.  He also has history of stage IIIC nodular melanoma of the left thigh. He completed treatment with Opdivo in October 2019.So far there has been no evidence of recurrence.  We will continue to follow along with him and plan to see him again in 3 months.  We will see him every 6 weeks for port flush and follow-up in 3 months.  He can contact our office with any questions or concerns. We can certainly see him sooner if needed.   Laverna Peace, NP 7/23/202112:27 PM

## 2019-10-29 NOTE — Patient Instructions (Signed)

## 2019-11-01 DIAGNOSIS — Z1159 Encounter for screening for other viral diseases: Secondary | ICD-10-CM | POA: Diagnosis not present

## 2019-11-01 DIAGNOSIS — Z20828 Contact with and (suspected) exposure to other viral communicable diseases: Secondary | ICD-10-CM | POA: Diagnosis not present

## 2019-11-01 LAB — IRON AND TIBC
Iron: 87 ug/dL (ref 42–163)
Saturation Ratios: 44 % (ref 20–55)
TIBC: 198 ug/dL — ABNORMAL LOW (ref 202–409)
UIBC: 111 ug/dL — ABNORMAL LOW (ref 117–376)

## 2019-11-01 LAB — FERRITIN: Ferritin: 78 ng/mL (ref 24–336)

## 2019-11-05 ENCOUNTER — Other Ambulatory Visit: Payer: Self-pay | Admitting: Internal Medicine

## 2019-11-08 DIAGNOSIS — Z1159 Encounter for screening for other viral diseases: Secondary | ICD-10-CM | POA: Diagnosis not present

## 2019-11-08 DIAGNOSIS — Z20828 Contact with and (suspected) exposure to other viral communicable diseases: Secondary | ICD-10-CM | POA: Diagnosis not present

## 2019-11-12 ENCOUNTER — Telehealth: Payer: Self-pay | Admitting: Internal Medicine

## 2019-11-12 MED ORDER — TORSEMIDE 10 MG PO TABS: 10.0000 mg | ORAL_TABLET | Freq: Every day | ORAL | 3 refills | Status: DC

## 2019-11-12 NOTE — Telephone Encounter (Signed)
See med refills

## 2019-11-12 NOTE — Telephone Encounter (Signed)
    1.Medication Requested: torsemide (DEMADEX) 10 MG tablet  2. Pharmacy (Name, Street, City):[facility] [place], [place]  3. On Med List: YES  4. Last Visit with PCP: 09/16/19  5. Next visit date with PCP: 12/23/19   Agent: Please be advised that RX refills may take up to 3 business days. We ask that you follow-up with your pharmacy.

## 2019-11-15 DIAGNOSIS — Z1159 Encounter for screening for other viral diseases: Secondary | ICD-10-CM | POA: Diagnosis not present

## 2019-11-15 DIAGNOSIS — Z20828 Contact with and (suspected) exposure to other viral communicable diseases: Secondary | ICD-10-CM | POA: Diagnosis not present

## 2019-11-22 DIAGNOSIS — Z20828 Contact with and (suspected) exposure to other viral communicable diseases: Secondary | ICD-10-CM | POA: Diagnosis not present

## 2019-11-22 DIAGNOSIS — Z1159 Encounter for screening for other viral diseases: Secondary | ICD-10-CM | POA: Diagnosis not present

## 2019-11-29 DIAGNOSIS — Z20828 Contact with and (suspected) exposure to other viral communicable diseases: Secondary | ICD-10-CM | POA: Diagnosis not present

## 2019-11-29 DIAGNOSIS — Z1159 Encounter for screening for other viral diseases: Secondary | ICD-10-CM | POA: Diagnosis not present

## 2019-12-04 ENCOUNTER — Other Ambulatory Visit: Payer: Self-pay | Admitting: Internal Medicine

## 2019-12-06 DIAGNOSIS — Z1159 Encounter for screening for other viral diseases: Secondary | ICD-10-CM | POA: Diagnosis not present

## 2019-12-06 DIAGNOSIS — Z20828 Contact with and (suspected) exposure to other viral communicable diseases: Secondary | ICD-10-CM | POA: Diagnosis not present

## 2019-12-10 ENCOUNTER — Inpatient Hospital Stay: Payer: Medicare Other | Attending: Hematology & Oncology

## 2019-12-10 ENCOUNTER — Other Ambulatory Visit: Payer: Self-pay

## 2019-12-10 DIAGNOSIS — Z452 Encounter for adjustment and management of vascular access device: Secondary | ICD-10-CM | POA: Diagnosis not present

## 2019-12-10 DIAGNOSIS — Z85820 Personal history of malignant melanoma of skin: Secondary | ICD-10-CM | POA: Diagnosis not present

## 2019-12-10 DIAGNOSIS — Z95828 Presence of other vascular implants and grafts: Secondary | ICD-10-CM

## 2019-12-10 MED ORDER — SODIUM CHLORIDE 0.9% FLUSH
10.0000 mL | Freq: Once | INTRAVENOUS | Status: AC
  Administered 2019-12-10: 10 mL via INTRAVENOUS
  Filled 2019-12-10: qty 10

## 2019-12-10 MED ORDER — HEPARIN SOD (PORK) LOCK FLUSH 100 UNIT/ML IV SOLN
500.0000 [IU] | Freq: Once | INTRAVENOUS | Status: AC
  Administered 2019-12-10: 500 [IU] via INTRAVENOUS
  Filled 2019-12-10: qty 5

## 2019-12-10 NOTE — Patient Instructions (Signed)

## 2019-12-13 DIAGNOSIS — Z1159 Encounter for screening for other viral diseases: Secondary | ICD-10-CM | POA: Diagnosis not present

## 2019-12-13 DIAGNOSIS — Z20828 Contact with and (suspected) exposure to other viral communicable diseases: Secondary | ICD-10-CM | POA: Diagnosis not present

## 2019-12-20 DIAGNOSIS — Z20828 Contact with and (suspected) exposure to other viral communicable diseases: Secondary | ICD-10-CM | POA: Diagnosis not present

## 2019-12-20 DIAGNOSIS — Z1159 Encounter for screening for other viral diseases: Secondary | ICD-10-CM | POA: Diagnosis not present

## 2019-12-23 ENCOUNTER — Encounter: Payer: Self-pay | Admitting: Internal Medicine

## 2019-12-23 ENCOUNTER — Other Ambulatory Visit: Payer: Self-pay

## 2019-12-23 ENCOUNTER — Ambulatory Visit (INDEPENDENT_AMBULATORY_CARE_PROVIDER_SITE_OTHER): Payer: Medicare Other | Admitting: Internal Medicine

## 2019-12-23 VITALS — BP 152/90 | HR 60 | Temp 98.0°F | Ht 66.0 in | Wt 145.0 lb

## 2019-12-23 DIAGNOSIS — Z79899 Other long term (current) drug therapy: Secondary | ICD-10-CM | POA: Diagnosis not present

## 2019-12-23 DIAGNOSIS — I2510 Atherosclerotic heart disease of native coronary artery without angina pectoris: Secondary | ICD-10-CM | POA: Diagnosis not present

## 2019-12-23 DIAGNOSIS — Z23 Encounter for immunization: Secondary | ICD-10-CM | POA: Diagnosis not present

## 2019-12-23 DIAGNOSIS — R5383 Other fatigue: Secondary | ICD-10-CM

## 2019-12-23 DIAGNOSIS — M05711 Rheumatoid arthritis with rheumatoid factor of right shoulder without organ or systems involvement: Secondary | ICD-10-CM | POA: Diagnosis not present

## 2019-12-23 DIAGNOSIS — M4726 Other spondylosis with radiculopathy, lumbar region: Secondary | ICD-10-CM

## 2019-12-23 MED ORDER — ALPRAZOLAM 0.5 MG PO TABS: 0.5000 mg | ORAL_TABLET | Freq: Two times a day (BID) | ORAL | 3 refills | Status: DC | PRN

## 2019-12-23 MED ORDER — HYDROMORPHONE HCL 2 MG PO TABS: 2.0000 mg | ORAL_TABLET | Freq: Three times a day (TID) | ORAL | 0 refills | Status: DC | PRN

## 2019-12-23 MED ORDER — PREDNISONE 10 MG PO TABS: ORAL_TABLET | ORAL | 1 refills | Status: DC

## 2019-12-23 NOTE — Assessment & Plan Note (Addendum)
Will ref to Dr Amil Amen Pains are worse. Increase PO steroids: 20 mg qam

## 2019-12-23 NOTE — Addendum Note (Signed)
Addended by: Lauralee Evener C on: 12/23/2019 04:02 PM   Modules accepted: Orders

## 2019-12-23 NOTE — Assessment & Plan Note (Signed)
Discussed.

## 2019-12-23 NOTE — Assessment & Plan Note (Signed)
Worse Treat pain Increase PO steroids

## 2019-12-23 NOTE — Assessment & Plan Note (Signed)
Off Pravastatin

## 2019-12-23 NOTE — Progress Notes (Signed)
Subjective:  Patient ID: [name], male    DOB: November 13, 1945  Age: 74 y.o. MRN: [id number]  CC: No chief complaint on file.   HPI [name] presents for chronic pain C/o severe L ankle pain - worse, it swells too Going on a trip in oct for 1 wk  F/u LBP - has an appt w/Dr Arnoldo Morale, he had a CT myelo  C/o severe B shoulder pain - worse; he saw ortho  F/u dyslipidemia, HTN, RA  Pain is 8/10. RA is worse - hands, shoulders, back etc    Outpatient Medications Prior to Visit  Medication Sig Dispense Refill  . albuterol (PROVENTIL HFA;VENTOLIN HFA) 108 (90 Base) MCG/ACT inhaler Inhale 1-2 puffs into the lungs every 4 (four) hours as needed for wheezing or shortness of breath. 1 Inhaler 1  . ALPRAZolam (XANAX) 0.5 MG tablet Take 1 tablet (0.5 mg total) by mouth 2 (two) times daily as needed. for anxiety 60 tablet 3  . Cholecalciferol (VITAMIN D3) 2000 UNITS capsule Take 1 capsule (2,000 Units total) by mouth daily. 100 capsule 3  . clopidogrel (PLAVIX) 75 MG tablet TAKE 1 TABLET BY MOUTH EVERY DAY WITH BREAKFAST 90 tablet 3  . FLUoxetine (PROZAC) 40 MG capsule TAKE 1 CAPSULE BY MOUTH TWICE DAILY 180 capsule 3  . fluticasone (FLONASE) 50 MCG/ACT nasal spray INSTILL 2 SPRAYS IN EACH NOSTRIL EVERY DAY 16 g 11  . gabapentin (NEURONTIN) 300 MG capsule TAKE 1 CAPSULE(300 MG) BY MOUTH TWICE DAILY 180 capsule 1  . HYDROmorphone (DILAUDID) 2 MG tablet Take 1 tablet (2 mg total) by mouth every 8 (eight) hours as needed for severe pain. 90 tablet 0  . lidocaine-prilocaine (EMLA) cream Apply 1 application topically as needed. 30 g 5  . losartan-hydrochlorothiazide (HYZAAR) 50-12.5 MG tablet TAKE 1 TABLET BY MOUTH DAILY 90 tablet 3  . Melatonin 5 MG TABS Take 5 mg by mouth at bedtime. Takes 10 mg at HS    . metoprolol tartrate (LOPRESSOR) 25 MG tablet TAKE 1 TABLET (25 MG TOTAL) BY MOUTH 2 (TWO) TIMES DAILY. 180 tablet 3  . Omega-3 Fatty Acids (FISH OIL OMEGA-3) 1000 MG CAPS Take  1,000 mg by mouth daily.     . ondansetron (ZOFRAN) 8 MG tablet Take 1 tablet (8 mg total) by mouth 3 (three) times daily. 90 tablet 3  . pantoprazole (PROTONIX) 40 MG tablet TAKE 1 TABLET (40 MG TOTAL) BY MOUTH EVERY EVENING. 90 tablet 3  . pravastatin (PRAVACHOL) 20 MG tablet Take 1 tablet (20 mg total) by mouth daily. 90 tablet 1  . predniSONE (DELTASONE) 5 MG tablet TAKE 2 TABLETS (10 MG TOTAL) BY MOUTH DAILY WITH BREAKFAST. (Patient taking differently: Take 5 mg by mouth daily with breakfast. ) 180 tablet 3  . prochlorperazine (COMPAZINE) 10 MG tablet TAKE 1 TABLET(10 MG) BY MOUTH EVERY 6 HOURS AS NEEDED FOR NAUSEA OR VOMITING 385 tablet 1  . Pyridoxine HCl (B-6 PO) Take 1 tablet by mouth.     . tadalafil (CIALIS) 5 MG tablet Take 5 mg by mouth as needed.     . torsemide (DEMADEX) 10 MG tablet Take 1-2 tablets (10-20 mg total) by mouth daily. 60 tablet 3  . traZODone (DESYREL) 100 MG tablet TAKE 1 TABLET AT BEDTIME 90 tablet 3  . vitamin B-12 (CYANOCOBALAMIN) 1000 MCG tablet Take 1,000 mcg by mouth daily.      No facility-administered medications prior to visit.    ROS: Review of Systems  Constitutional: Positive for fatigue. Negative for appetite change and unexpected weight change.  HENT: Negative for congestion, nosebleeds, sneezing, sore throat and trouble swallowing.   Eyes: Negative for itching and visual disturbance.  Respiratory: Negative for cough.   Cardiovascular: Negative for chest pain, palpitations and leg swelling.  Gastrointestinal: Negative for abdominal distention, blood in stool, diarrhea and nausea.  Genitourinary: Negative for frequency and hematuria.  Musculoskeletal: Positive for arthralgias, back pain, neck pain and neck stiffness. Negative for gait problem and joint swelling.  Skin: Negative for rash.  Neurological: Negative for dizziness, tremors, speech difficulty and weakness.  Psychiatric/Behavioral: Negative for agitation, dysphoric mood and sleep  disturbance. The patient is nervous/anxious.     Objective:  BP (!) 152/90 (BP Location: Right Arm, Patient Position: Sitting, Cuff Size: Normal)   Pulse 60   Temp 98 F (36.7 C) (Oral)   Ht 5' 6"  (1.676 m)   Wt 145 lb (65.8 kg)   SpO2 94%   BMI 23.40 kg/m   BP Readings from Last 3 Encounters:  12/23/19 (!) 152/90  10/29/19 96/74  10/14/19 (!) 126/55    Wt Readings from Last 3 Encounters:  12/23/19 145 lb (65.8 kg)  10/29/19 145 lb (65.8 kg)  09/30/19 144 lb 9.6 oz (65.6 kg)    Physical Exam Constitutional:      General: He is not in acute distress.    Appearance: He is well-developed.     Comments: NAD  Eyes:     Conjunctiva/sclera: Conjunctivae normal.     Pupils: Pupils are equal, round, and reactive to light.  Neck:     Thyroid: No thyromegaly.     Vascular: No JVD.  Cardiovascular:     Rate and Rhythm: Normal rate and regular rhythm.     Heart sounds: Normal heart sounds. No murmur heard.  No friction rub. No gallop.   Pulmonary:     Effort: Pulmonary effort is normal. No respiratory distress.     Breath sounds: Normal breath sounds. No wheezing or rales.  Chest:     Chest wall: No tenderness.  Abdominal:     General: Bowel sounds are normal. There is no distension.     Palpations: Abdomen is soft. There is no mass.     Tenderness: There is no abdominal tenderness. There is no guarding or rebound.  Musculoskeletal:        General: Tenderness present. Normal range of motion.     Cervical back: Normal range of motion.  Lymphadenopathy:     Cervical: No cervical adenopathy.  Skin:    General: Skin is warm and dry.     Findings: No rash.  Neurological:     Mental Status: He is alert and oriented to person, place, and time.     Cranial Nerves: No cranial nerve deficit.     Motor: No abnormal muscle tone.     Coordination: Coordination normal.     Gait: Gait normal.     Deep Tendon Reflexes: Reflexes are normal and symmetric.  Psychiatric:         Behavior: Behavior normal.        Thought Content: Thought content normal.        Judgment: Judgment normal.   RA deformities, LS w/pain Ankles - pain and swelling Very slow   Time >45 min: time was spent on coordination of care, reviewing past records, lab results and X rays; educating the patient.      Lab Results  Component Value Date  WBC 7.8 10/29/2019   HGB 12.3 (L) 10/29/2019   HCT 36.8 (L) 10/29/2019   PLT 194 10/29/2019   GLUCOSE 100 (H) 10/29/2019   CHOL 128 03/10/2015   TRIG 95.0 03/10/2015   HDL 39.30 03/10/2015   LDLCALC 70 03/10/2015   ALT 10 10/29/2019   AST 13 (L) 10/29/2019   NA 127 (L) 10/29/2019   K 4.0 10/29/2019   CL 93 (L) 10/29/2019   CREATININE 1.18 10/29/2019   BUN 29 (H) 10/29/2019   CO2 28 10/29/2019   TSH 0.680 09/18/2017   PSA 1.95 10/15/2010   INR 1.1 (H) 06/14/2014   HGBA1C 6.0 09/28/2015    CT THORACIC SPINE W CONTRAST  Result Date: 10/14/2019 CLINICAL DATA:  Spondylosis without myelopathy. Chronic back pain and bilateral leg symptoms. FLUOROSCOPY TIME:  1 minutes 56 seconds. 508.29 micro gray meter squared PROCEDURE: LUMBAR PUNCTURE FOR THORACIC AND LUMBAR MYELOGRAM After thorough discussion of risks and benefits of the procedure including bleeding, infection, injury to nerves, blood vessels, adjacent structures as well as headache and CSF leak, written and oral informed consent was obtained. Consent was obtained by Dr. Nelson Chimes. Patient was positioned prone on the fluoroscopy table. Local anesthesia was provided with 1% lidocaine without epinephrine after prepped and draped in the usual sterile fashion. Puncture was performed at L5 using a 3 1/2 inch 22-gauge spinal needle via right paramedian approach. Using a single pass through the dura, the needle was placed within the thecal sac, with return of clear CSF. 10 mL of Isovue M-300 was injected into the thecal sac, with normal opacification of the nerve roots and cauda equina consistent  with free flow within the subarachnoid space. The patient was then moved to the trendelenburg position and contrast flowed into the Thoracic spine region. I personally performed the lumbar puncture and administered the intrathecal contrast. I also personally performed acquisition of the myelogram images. TECHNIQUE: Contiguous axial images were obtained through the Thoracic and Lumbar spine after the intrathecal infusion of infusion. Coronal and sagittal reconstructions were obtained of the axial image sets. FINDINGS: THORACIC AND LUMBAR MYELOGRAM FINDINGS: Lumbar region: Previous decompression and fusion procedure from L1-S1. Chronic screw fractures at S1. Wide patency of the spinal canal throughout that region. Flexion extension views do not show any visible lumbar motion. Thoracic region: Thoracic curvature convex to the right. 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 No apparent compressive central canal stenosis. See results of CT scan. CT THORACIC MYELOGRAM FINDINGS: Thoracic curvature convex to the right with the apex at about T8. Chronic degenerative anterolisthesis at C7-T1, with apparent facet joint effusion. No significant disc pathology at T1-2 or T2-3. From T3-4 through T12-L1, the thoracic discs show degeneration with loss of disc height and areas of vacuum phenomenon. Small endplate osteophytes. No compressive stenosis of the thoracic spinal canal. Foramina are sufficiently patent. Ordinary mild thoracic facet osteoarthritis without pronounced hypertrophic change. CT LUMBAR MYELOGRAM FINDINGS: T12-L1: Mild bulging of the disc. No canal or foraminal stenosis. Conus tip is at L1. L1-2: Previous fusion procedure with pedicle screws and posterior rods. Screw on the left at L1 appears to breach the medial cortex of the pedicle on a chronic basis but does not cause visible neural compression. L2-3: Previous fusion procedure with pedicle screws and  posterior rods. Wide patency of the canal and foramina. Right-sided L3 screw breeches the medial cortex of the pedicle without distinct neural compression. L3-4: Previous fusion procedure with pedicle screws and posterior  rods. Wide patency of the canal. Right-sided pedicle screw at L4 breaches the medial pedicle and traverses the right lateral recess. Loss of the bracket attachment of the right screw to the posterior rod. L4-5: Previous fusion procedure with pedicle screws and posterior rods. Wide patency of the canal and foramina. 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 L5-S1: Previous fusion procedure with pedicle screws and posterior rods. Chronic S1 screw fractures. There may not be solid union at this level, with potential for ongoing motion. No compressive canal stenosis. Mild bilateral foraminal narrowing, not grossly compressive. 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 Contrast is excluded from the tip of the thecal sac. IMPRESSION: Lumbar region: Previous posterior decompression and fusion procedure from L1 to the sacrum. Solid union from L1 through L5. Possible nonunion at L5-S1. Sufficient patency of the canal and foramina. Some of the pedicle screws do breach the medial pedicle cortices as outlined above. Chronic S1 screw fractures. Right screw at L4 is no longer fully attached by the bracket to the posterior rod. 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 Distal thecal sac is excluded from the contrast by pronounced arachnoiditis. 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 Mild thoracic spinal curvature. No compressive stenosis of the spinal canal or likely significant foraminal stenosis. Some facet osteoarthritis could contribute to back pain. Electronically Signed   By: Nelson Chimes M.D.   On: 10/14/2019 11:04   CT LUMBAR SPINE W CONTRAST  Result Date:  10/14/2019 CLINICAL DATA:  Spondylosis without myelopathy. Chronic back pain and bilateral leg symptoms. FLUOROSCOPY TIME:  1 minutes 56 seconds. 508.29 micro gray meter squared PROCEDURE: LUMBAR PUNCTURE FOR THORACIC AND LUMBAR MYELOGRAM After thorough discussion of risks and benefits of the procedure including bleeding, infection, injury to nerves, blood vessels, adjacent structures as well as headache and CSF leak, written and oral informed consent was obtained. Consent was obtained by Dr. Nelson Chimes. Patient was positioned prone on the fluoroscopy table. Local anesthesia was provided with 1% lidocaine without epinephrine after prepped and draped in the usual sterile fashion. Puncture was performed at L5 using a 3 1/2 inch 22-gauge spinal needle via right paramedian approach. Using a single pass through the dura, the needle was placed within the thecal sac, with return of clear CSF. 10 mL of Isovue M-300 was injected into the thecal sac, with normal opacification of the nerve roots and cauda equina consistent with free flow within the subarachnoid space. The patient was then moved to the trendelenburg position and contrast flowed into the Thoracic spine region. I personally performed the lumbar puncture and administered the intrathecal contrast. I also personally performed acquisition of the myelogram images. TECHNIQUE: Contiguous axial images were obtained through the Thoracic and Lumbar spine after the intrathecal infusion of infusion. Coronal and sagittal reconstructions were obtained of the axial image sets. FINDINGS: THORACIC AND LUMBAR MYELOGRAM FINDINGS: Lumbar region: Previous decompression and fusion procedure from L1-S1. Chronic screw fractures at S1. Wide patency of the spinal canal throughout that region. Flexion extension views do not show any visible lumbar motion. Thoracic region: Thoracic curvature convex to the right. 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 No apparent compressive central canal stenosis. See results of CT scan. CT THORACIC MYELOGRAM FINDINGS: Thoracic curvature convex to the right with the apex at about T8. Chronic degenerative anterolisthesis at C7-T1, with apparent facet joint effusion. No significant disc pathology at T1-2  or T2-3. From T3-4 through T12-L1, the thoracic discs show degeneration with loss of disc height and areas of vacuum phenomenon. Small endplate osteophytes. No compressive stenosis of the thoracic spinal canal. Foramina are sufficiently patent. Ordinary mild thoracic facet osteoarthritis without pronounced hypertrophic change. CT LUMBAR MYELOGRAM FINDINGS: T12-L1: Mild bulging of the disc. No canal or foraminal stenosis. Conus tip is at L1. L1-2: Previous fusion procedure with pedicle screws and posterior rods. Screw on the left at L1 appears to breach the medial cortex of the pedicle on a chronic basis but does not cause visible neural compression. L2-3: Previous fusion procedure with pedicle screws and posterior rods. Wide patency of the canal and foramina. Right-sided L3 screw breeches the medial cortex of the pedicle without distinct neural compression. L3-4: Previous fusion procedure with pedicle screws and posterior rods. Wide patency of the canal. Right-sided pedicle screw at L4 breaches the medial pedicle and traverses the right lateral recess. Loss of the bracket attachment of the right screw to the posterior rod. L4-5: Previous fusion procedure with pedicle screws and posterior rods. Wide patency of the canal and foramina. 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 L5-S1: Previous fusion procedure with pedicle screws and posterior rods. Chronic S1 screw fractures. There may not be solid union at this level, with potential for ongoing motion. No compressive canal stenosis. Mild bilateral foraminal narrowing, not grossly compressive. Pronounced arachnoiditis pattern in the  distal thecal sac. Contrast is excluded from the tip of the thecal sac. IMPRESSION: Lumbar region: Previous posterior decompression and fusion procedure from L1 to the sacrum. Solid union from L1 through L5. Possible nonunion at L5-S1. Sufficient patency of the canal and foramina. Some of the pedicle screws do breach the medial pedicle cortices as outlined above. Chronic S1 screw fractures. Right screw at L4 is no longer fully attached by the bracket to the posterior rod. 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 Distal thecal sac is excluded from the contrast by pronounced arachnoiditis. 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 Mild thoracic spinal curvature. No compressive stenosis of the spinal canal or likely significant foraminal stenosis. Some facet osteoarthritis could contribute to back pain. Electronically Signed   By: Nelson Chimes M.D.   On: 10/14/2019 11:04   DG MYELOGRAPHY LUMBAR INJ MULTI REGION  Result Date: 10/14/2019 CLINICAL DATA:  Spondylosis without myelopathy. Chronic back pain and bilateral leg symptoms. FLUOROSCOPY TIME:  1 minutes 56 seconds. 508.29 micro gray meter squared PROCEDURE: LUMBAR PUNCTURE FOR THORACIC AND LUMBAR MYELOGRAM After thorough discussion of risks and benefits of the procedure including bleeding, infection, injury to nerves, blood vessels, adjacent structures as well as headache and CSF leak, written and oral informed consent was obtained. Consent was obtained by Dr. Nelson Chimes. Patient was positioned prone on the fluoroscopy table. Local anesthesia was provided with 1% lidocaine without epinephrine after prepped and draped in the usual sterile fashion. Puncture was performed at L5 using a 3 1/2 inch 22-gauge spinal needle via right paramedian approach. Using a single pass through the dura, the needle was placed within the thecal sac, with return of clear CSF. 10 mL of Isovue  M-300 was injected into the thecal sac, with normal opacification of the nerve roots and cauda equina consistent with free flow within the subarachnoid space. The patient was then moved to the trendelenburg position and contrast flowed into the Thoracic spine region. I personally performed the lumbar puncture and administered the  intrathecal contrast. I also personally performed acquisition of the myelogram images. TECHNIQUE: Contiguous axial images were obtained through the Thoracic and Lumbar spine after the intrathecal infusion of infusion. Coronal and sagittal reconstructions were obtained of the axial image sets. FINDINGS: THORACIC AND LUMBAR MYELOGRAM FINDINGS: Lumbar region: Previous decompression and fusion procedure from L1-S1. Chronic screw fractures at S1. Wide patency of the spinal canal throughout that region. Flexion extension views do not show any visible lumbar motion. Thoracic region: Thoracic curvature convex to the right. 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 No apparent compressive central canal stenosis. See results of CT scan. CT THORACIC MYELOGRAM FINDINGS: Thoracic curvature convex to the right with the apex at about T8. Chronic degenerative anterolisthesis at C7-T1, with apparent facet joint effusion. No significant disc pathology at T1-2 or T2-3. From T3-4 through T12-L1, the thoracic discs show degeneration with loss of disc height and areas of vacuum phenomenon. Small endplate osteophytes. No compressive stenosis of the thoracic spinal canal. Foramina are sufficiently patent. Ordinary mild thoracic facet osteoarthritis without pronounced hypertrophic change. CT LUMBAR MYELOGRAM FINDINGS: T12-L1: Mild bulging of the disc. No canal or foraminal stenosis. Conus tip is at L1. L1-2: Previous fusion procedure with pedicle screws and posterior rods. Screw on the left at L1 appears to breach the medial cortex of the pedicle on a  chronic basis but does not cause visible neural compression. L2-3: Previous fusion procedure with pedicle screws and posterior rods. Wide patency of the canal and foramina. Right-sided L3 screw breeches the medial cortex of the pedicle without distinct neural compression. L3-4: Previous fusion procedure with pedicle screws and posterior rods. Wide patency of the canal. Right-sided pedicle screw at L4 breaches the medial pedicle and traverses the right lateral recess. Loss of the bracket attachment of the right screw to the posterior rod. L4-5: Previous fusion procedure with pedicle screws and posterior rods. Wide patency of the canal and foramina. 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 L5-S1: Previous fusion procedure with pedicle screws and posterior rods. Chronic S1 screw fractures. There may not be solid union at this level, with potential for ongoing motion. No compressive canal stenosis. Mild bilateral foraminal narrowing, not grossly compressive. 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 Contrast is excluded from the tip of the thecal sac. IMPRESSION: Lumbar region: Previous posterior decompression and fusion procedure from L1 to the sacrum. Solid union from L1 through L5. Possible nonunion at L5-S1. Sufficient patency of the canal and foramina. Some of the pedicle screws do breach the medial pedicle cortices as outlined above. Chronic S1 screw fractures. Right screw at L4 is no longer fully attached by the bracket to the posterior rod. 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 Distal thecal sac is excluded from the contrast by pronounced arachnoiditis. 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 Mild thoracic spinal curvature. No compressive stenosis of the spinal canal or likely significant foraminal stenosis. Some facet osteoarthritis could contribute to back pain. Electronically  Signed   By: Nelson Chimes M.D.   On: 10/14/2019 11:04    Assessment & Plan:    [name], MD

## 2019-12-24 DIAGNOSIS — C44311 Basal cell carcinoma of skin of nose: Secondary | ICD-10-CM | POA: Diagnosis not present

## 2019-12-24 DIAGNOSIS — Z85820 Personal history of malignant melanoma of skin: Secondary | ICD-10-CM | POA: Diagnosis not present

## 2019-12-24 DIAGNOSIS — L720 Epidermal cyst: Secondary | ICD-10-CM | POA: Diagnosis not present

## 2019-12-24 DIAGNOSIS — L814 Other melanin hyperpigmentation: Secondary | ICD-10-CM | POA: Diagnosis not present

## 2019-12-24 DIAGNOSIS — L821 Other seborrheic keratosis: Secondary | ICD-10-CM | POA: Diagnosis not present

## 2019-12-24 DIAGNOSIS — Z85828 Personal history of other malignant neoplasm of skin: Secondary | ICD-10-CM | POA: Diagnosis not present

## 2019-12-24 DIAGNOSIS — D485 Neoplasm of uncertain behavior of skin: Secondary | ICD-10-CM | POA: Diagnosis not present

## 2019-12-24 DIAGNOSIS — D1801 Hemangioma of skin and subcutaneous tissue: Secondary | ICD-10-CM | POA: Diagnosis not present

## 2019-12-27 DIAGNOSIS — Z20828 Contact with and (suspected) exposure to other viral communicable diseases: Secondary | ICD-10-CM | POA: Diagnosis not present

## 2019-12-27 DIAGNOSIS — Z1159 Encounter for screening for other viral diseases: Secondary | ICD-10-CM | POA: Diagnosis not present

## 2019-12-28 DIAGNOSIS — S32009K Unspecified fracture of unspecified lumbar vertebra, subsequent encounter for fracture with nonunion: Secondary | ICD-10-CM | POA: Diagnosis not present

## 2019-12-30 DIAGNOSIS — M25572 Pain in left ankle and joints of left foot: Secondary | ICD-10-CM | POA: Diagnosis not present

## 2019-12-30 DIAGNOSIS — R262 Difficulty in walking, not elsewhere classified: Secondary | ICD-10-CM | POA: Diagnosis not present

## 2019-12-30 DIAGNOSIS — M545 Low back pain: Secondary | ICD-10-CM | POA: Diagnosis not present

## 2019-12-30 DIAGNOSIS — M6281 Muscle weakness (generalized): Secondary | ICD-10-CM | POA: Diagnosis not present

## 2020-01-03 DIAGNOSIS — Z1159 Encounter for screening for other viral diseases: Secondary | ICD-10-CM | POA: Diagnosis not present

## 2020-01-03 DIAGNOSIS — R262 Difficulty in walking, not elsewhere classified: Secondary | ICD-10-CM | POA: Diagnosis not present

## 2020-01-03 DIAGNOSIS — M25572 Pain in left ankle and joints of left foot: Secondary | ICD-10-CM | POA: Diagnosis not present

## 2020-01-03 DIAGNOSIS — M545 Low back pain: Secondary | ICD-10-CM | POA: Diagnosis not present

## 2020-01-03 DIAGNOSIS — Z20828 Contact with and (suspected) exposure to other viral communicable diseases: Secondary | ICD-10-CM | POA: Diagnosis not present

## 2020-01-03 DIAGNOSIS — M6281 Muscle weakness (generalized): Secondary | ICD-10-CM | POA: Diagnosis not present

## 2020-01-04 DIAGNOSIS — Z961 Presence of intraocular lens: Secondary | ICD-10-CM | POA: Diagnosis not present

## 2020-01-04 DIAGNOSIS — H3581 Retinal edema: Secondary | ICD-10-CM | POA: Diagnosis not present

## 2020-01-04 DIAGNOSIS — H04123 Dry eye syndrome of bilateral lacrimal glands: Secondary | ICD-10-CM | POA: Diagnosis not present

## 2020-01-04 DIAGNOSIS — H43813 Vitreous degeneration, bilateral: Secondary | ICD-10-CM | POA: Diagnosis not present

## 2020-01-04 DIAGNOSIS — H35033 Hypertensive retinopathy, bilateral: Secondary | ICD-10-CM | POA: Diagnosis not present

## 2020-01-05 DIAGNOSIS — M25572 Pain in left ankle and joints of left foot: Secondary | ICD-10-CM | POA: Diagnosis not present

## 2020-01-05 DIAGNOSIS — M6281 Muscle weakness (generalized): Secondary | ICD-10-CM | POA: Diagnosis not present

## 2020-01-05 DIAGNOSIS — R262 Difficulty in walking, not elsewhere classified: Secondary | ICD-10-CM | POA: Diagnosis not present

## 2020-01-05 DIAGNOSIS — M545 Low back pain: Secondary | ICD-10-CM | POA: Diagnosis not present

## 2020-01-07 DIAGNOSIS — R262 Difficulty in walking, not elsewhere classified: Secondary | ICD-10-CM | POA: Diagnosis not present

## 2020-01-07 DIAGNOSIS — M545 Low back pain, unspecified: Secondary | ICD-10-CM | POA: Diagnosis not present

## 2020-01-07 DIAGNOSIS — M25572 Pain in left ankle and joints of left foot: Secondary | ICD-10-CM | POA: Diagnosis not present

## 2020-01-07 DIAGNOSIS — M6281 Muscle weakness (generalized): Secondary | ICD-10-CM | POA: Diagnosis not present

## 2020-01-10 DIAGNOSIS — R262 Difficulty in walking, not elsewhere classified: Secondary | ICD-10-CM | POA: Diagnosis not present

## 2020-01-10 DIAGNOSIS — M545 Low back pain, unspecified: Secondary | ICD-10-CM | POA: Diagnosis not present

## 2020-01-10 DIAGNOSIS — M25572 Pain in left ankle and joints of left foot: Secondary | ICD-10-CM | POA: Diagnosis not present

## 2020-01-10 DIAGNOSIS — M6281 Muscle weakness (generalized): Secondary | ICD-10-CM | POA: Diagnosis not present

## 2020-01-10 DIAGNOSIS — Z1159 Encounter for screening for other viral diseases: Secondary | ICD-10-CM | POA: Diagnosis not present

## 2020-01-10 DIAGNOSIS — Z20828 Contact with and (suspected) exposure to other viral communicable diseases: Secondary | ICD-10-CM | POA: Diagnosis not present

## 2020-01-12 DIAGNOSIS — M6281 Muscle weakness (generalized): Secondary | ICD-10-CM | POA: Diagnosis not present

## 2020-01-12 DIAGNOSIS — M25572 Pain in left ankle and joints of left foot: Secondary | ICD-10-CM | POA: Diagnosis not present

## 2020-01-12 DIAGNOSIS — M545 Low back pain, unspecified: Secondary | ICD-10-CM | POA: Diagnosis not present

## 2020-01-12 DIAGNOSIS — R262 Difficulty in walking, not elsewhere classified: Secondary | ICD-10-CM | POA: Diagnosis not present

## 2020-01-14 DIAGNOSIS — M6281 Muscle weakness (generalized): Secondary | ICD-10-CM | POA: Diagnosis not present

## 2020-01-14 DIAGNOSIS — R262 Difficulty in walking, not elsewhere classified: Secondary | ICD-10-CM | POA: Diagnosis not present

## 2020-01-14 DIAGNOSIS — M25572 Pain in left ankle and joints of left foot: Secondary | ICD-10-CM | POA: Diagnosis not present

## 2020-01-14 DIAGNOSIS — M545 Low back pain, unspecified: Secondary | ICD-10-CM | POA: Diagnosis not present

## 2020-01-17 DIAGNOSIS — M6281 Muscle weakness (generalized): Secondary | ICD-10-CM | POA: Diagnosis not present

## 2020-01-17 DIAGNOSIS — M545 Low back pain, unspecified: Secondary | ICD-10-CM | POA: Diagnosis not present

## 2020-01-17 DIAGNOSIS — R262 Difficulty in walking, not elsewhere classified: Secondary | ICD-10-CM | POA: Diagnosis not present

## 2020-01-17 DIAGNOSIS — Z20828 Contact with and (suspected) exposure to other viral communicable diseases: Secondary | ICD-10-CM | POA: Diagnosis not present

## 2020-01-17 DIAGNOSIS — M25572 Pain in left ankle and joints of left foot: Secondary | ICD-10-CM | POA: Diagnosis not present

## 2020-01-17 DIAGNOSIS — Z1159 Encounter for screening for other viral diseases: Secondary | ICD-10-CM | POA: Diagnosis not present

## 2020-01-19 DIAGNOSIS — R262 Difficulty in walking, not elsewhere classified: Secondary | ICD-10-CM | POA: Diagnosis not present

## 2020-01-19 DIAGNOSIS — M545 Low back pain, unspecified: Secondary | ICD-10-CM | POA: Diagnosis not present

## 2020-01-19 DIAGNOSIS — M25572 Pain in left ankle and joints of left foot: Secondary | ICD-10-CM | POA: Diagnosis not present

## 2020-01-19 DIAGNOSIS — M6281 Muscle weakness (generalized): Secondary | ICD-10-CM | POA: Diagnosis not present

## 2020-01-21 DIAGNOSIS — R262 Difficulty in walking, not elsewhere classified: Secondary | ICD-10-CM | POA: Diagnosis not present

## 2020-01-21 DIAGNOSIS — M6281 Muscle weakness (generalized): Secondary | ICD-10-CM | POA: Diagnosis not present

## 2020-01-21 DIAGNOSIS — M545 Low back pain, unspecified: Secondary | ICD-10-CM | POA: Diagnosis not present

## 2020-01-21 DIAGNOSIS — M25572 Pain in left ankle and joints of left foot: Secondary | ICD-10-CM | POA: Diagnosis not present

## 2020-01-24 DIAGNOSIS — M6281 Muscle weakness (generalized): Secondary | ICD-10-CM | POA: Diagnosis not present

## 2020-01-24 DIAGNOSIS — Z1159 Encounter for screening for other viral diseases: Secondary | ICD-10-CM | POA: Diagnosis not present

## 2020-01-24 DIAGNOSIS — Z20828 Contact with and (suspected) exposure to other viral communicable diseases: Secondary | ICD-10-CM | POA: Diagnosis not present

## 2020-01-24 DIAGNOSIS — M545 Low back pain, unspecified: Secondary | ICD-10-CM | POA: Diagnosis not present

## 2020-01-24 DIAGNOSIS — M25572 Pain in left ankle and joints of left foot: Secondary | ICD-10-CM | POA: Diagnosis not present

## 2020-01-24 DIAGNOSIS — R262 Difficulty in walking, not elsewhere classified: Secondary | ICD-10-CM | POA: Diagnosis not present

## 2020-01-26 ENCOUNTER — Ambulatory Visit: Payer: Medicare Other | Admitting: Internal Medicine

## 2020-02-02 DIAGNOSIS — Z85820 Personal history of malignant melanoma of skin: Secondary | ICD-10-CM | POA: Diagnosis not present

## 2020-02-02 DIAGNOSIS — C44311 Basal cell carcinoma of skin of nose: Secondary | ICD-10-CM | POA: Diagnosis not present

## 2020-02-02 DIAGNOSIS — M25572 Pain in left ankle and joints of left foot: Secondary | ICD-10-CM | POA: Diagnosis not present

## 2020-02-02 DIAGNOSIS — M545 Low back pain, unspecified: Secondary | ICD-10-CM | POA: Diagnosis not present

## 2020-02-02 DIAGNOSIS — Z85828 Personal history of other malignant neoplasm of skin: Secondary | ICD-10-CM | POA: Diagnosis not present

## 2020-02-02 DIAGNOSIS — M6281 Muscle weakness (generalized): Secondary | ICD-10-CM | POA: Diagnosis not present

## 2020-02-02 DIAGNOSIS — R262 Difficulty in walking, not elsewhere classified: Secondary | ICD-10-CM | POA: Diagnosis not present

## 2020-02-04 ENCOUNTER — Other Ambulatory Visit: Payer: Medicare Other

## 2020-02-04 ENCOUNTER — Ambulatory Visit: Payer: Medicare Other | Admitting: Hematology & Oncology

## 2020-02-07 ENCOUNTER — Ambulatory Visit (INDEPENDENT_AMBULATORY_CARE_PROVIDER_SITE_OTHER): Payer: Medicare Other | Admitting: Internal Medicine

## 2020-02-07 ENCOUNTER — Other Ambulatory Visit: Payer: Self-pay

## 2020-02-07 ENCOUNTER — Encounter: Payer: Self-pay | Admitting: Internal Medicine

## 2020-02-07 DIAGNOSIS — I2510 Atherosclerotic heart disease of native coronary artery without angina pectoris: Secondary | ICD-10-CM

## 2020-02-07 DIAGNOSIS — K518 Other ulcerative 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: Secondary | ICD-10-CM | POA: Diagnosis not present

## 2020-02-07 DIAGNOSIS — M05711 Rheumatoid arthritis with rheumatoid factor of right shoulder without organ or systems involvement: Secondary | ICD-10-CM | POA: Diagnosis not present

## 2020-02-07 DIAGNOSIS — Z1159 Encounter for screening for other viral diseases: Secondary | ICD-10-CM | POA: Diagnosis not present

## 2020-02-07 DIAGNOSIS — Z20828 Contact with and (suspected) exposure to other viral communicable diseases: Secondary | ICD-10-CM | POA: Diagnosis not present

## 2020-02-07 DIAGNOSIS — F419 Anxiety disorder, unspecified: Secondary | ICD-10-CM

## 2020-02-07 MED ORDER — HYDROMORPHONE HCL 2 MG PO TABS
2.0000 mg | ORAL_TABLET | Freq: Three times a day (TID) | ORAL | 0 refills | Status: DC | PRN
Start: 2020-02-07 — End: 2020-03-21

## 2020-02-07 MED ORDER — PREDNISONE 5 MG PO TABS: 15.0000 mg | ORAL_TABLET | Freq: Every day | ORAL | 3 refills | Status: DC

## 2020-02-07 MED ORDER — ALPRAZOLAM 0.5 MG PO TABS
0.5000 mg | ORAL_TABLET | Freq: Three times a day (TID) | ORAL | 3 refills | Status: DC | PRN
Start: 2020-02-07 — End: 2020-06-06

## 2020-02-07 NOTE — Assessment & Plan Note (Signed)
Xanax prn  Potential benefits of a long term benzodiazepines  use as well as potential risks  and complications were explained to the patient and were aknowledged. Not to take w/dilaudid

## 2020-02-07 NOTE — Assessment & Plan Note (Signed)
Chronic diarrhea

## 2020-02-07 NOTE — Progress Notes (Signed)
Subjective:  Patient ID: YADEN SEITH, male    DOB: January 25, 1946  Age: 74 y.o. MRN: [id number]  CC: Follow-up (1 MONTH FOLOW-UP)   HPI RAJIV PARLATO presents for LBP - worse after a car trip to [place]  F/u anxiety - worse, depression  Outpatient Medications Prior to Visit  Medication Sig Dispense Refill  . albuterol (PROVENTIL HFA;VENTOLIN HFA) 108 (90 Base) MCG/ACT inhaler Inhale 1-2 puffs into the lungs every 4 (four) hours as needed for wheezing or shortness of breath. 1 Inhaler 1  . ALPRAZolam (XANAX) 0.5 MG tablet Take 1 tablet (0.5 mg total) by mouth 2 (two) times daily as needed. for anxiety 60 tablet 3  . Cholecalciferol (VITAMIN D3) 2000 UNITS capsule Take 1 capsule (2,000 Units total) by mouth daily. 100 capsule 3  . clopidogrel (PLAVIX) 75 MG tablet TAKE 1 TABLET BY MOUTH EVERY DAY WITH BREAKFAST 90 tablet 3  . FLUoxetine (PROZAC) 40 MG capsule TAKE 1 CAPSULE BY MOUTH TWICE DAILY 180 capsule 3  . fluticasone (FLONASE) 50 MCG/ACT nasal spray INSTILL 2 SPRAYS IN EACH NOSTRIL EVERY DAY 16 g 11  . gabapentin (NEURONTIN) 300 MG capsule TAKE 1 CAPSULE(300 MG) BY MOUTH TWICE DAILY 180 capsule 1  . HYDROmorphone (DILAUDID) 2 MG tablet Take 1-1.5 tablets (2-3 mg total) by mouth every 8 (eight) hours as needed for severe pain. 90 tablet 0  . lidocaine-prilocaine (EMLA) cream Apply 1 application topically as needed. 30 g 5  . losartan-hydrochlorothiazide (HYZAAR) 50-12.5 MG tablet TAKE 1 TABLET BY MOUTH DAILY 90 tablet 3  . Melatonin 5 MG TABS Take 5 mg by mouth at bedtime. Takes 10 mg at HS    . metoprolol tartrate (LOPRESSOR) 25 MG tablet TAKE 1 TABLET (25 MG TOTAL) BY MOUTH 2 (TWO) TIMES DAILY. 180 tablet 3  . Omega-3 Fatty Acids (FISH OIL OMEGA-3) 1000 MG CAPS Take 1,000 mg by mouth daily.     . ondansetron (ZOFRAN) 8 MG tablet Take 1 tablet (8 mg total) by mouth 3 (three) times daily. 90 tablet 3  . pantoprazole (PROTONIX) 40 MG tablet TAKE 1 TABLET (40 MG TOTAL) BY  MOUTH EVERY EVENING. 90 tablet 3  . pravastatin (PRAVACHOL) 20 MG tablet Take 1 tablet (20 mg total) by mouth daily. 90 tablet 1  . predniSONE (DELTASONE) 10 MG tablet 20 mg po qam pc or as directed 60 tablet 1  . prochlorperazine (COMPAZINE) 10 MG tablet TAKE 1 TABLET(10 MG) BY MOUTH EVERY 6 HOURS AS NEEDED FOR NAUSEA OR VOMITING 385 tablet 1  . Pyridoxine HCl (B-6 PO) Take 1 tablet by mouth.     . tadalafil (CIALIS) 5 MG tablet Take 5 mg by mouth as needed.     . torsemide (DEMADEX) 10 MG tablet Take 1-2 tablets (10-20 mg total) by mouth daily. 60 tablet 3  . traZODone (DESYREL) 100 MG tablet TAKE 1 TABLET AT BEDTIME 90 tablet 3  . vitamin B-12 (CYANOCOBALAMIN) 1000 MCG tablet Take 1,000 mcg by mouth daily.      No facility-administered medications prior to visit.    ROS: Review of Systems  Constitutional: Positive for fatigue. Negative for appetite change and unexpected weight change.  HENT: Negative for congestion, nosebleeds, sneezing, sore throat and trouble swallowing.   Eyes: Negative for itching and visual disturbance.  Respiratory: Negative for cough.   Cardiovascular: Negative for chest pain, palpitations and leg swelling.  Gastrointestinal: Negative for abdominal distention, blood in stool, diarrhea and nausea.  Genitourinary: Negative for frequency  and hematuria.  Musculoskeletal: Positive for arthralgias, back pain and gait problem. Negative for joint swelling and neck pain.  Skin: Negative for rash.  Neurological: Negative for dizziness, tremors, speech difficulty and weakness.  Psychiatric/Behavioral: Positive for sleep disturbance. Negative for agitation, dysphoric mood and suicidal ideas. The patient is nervous/anxious.     Objective:  BP 138/70 (BP Location: Left Arm)   Pulse (!) 59   Temp 98.6 F (37 C)   Wt 147 lb 9.6 oz (67 kg)   SpO2 97%   BMI 23.82 kg/m   BP Readings from Last 3 Encounters:  02/07/20 138/70  12/23/19 (!) 152/90  10/29/19 96/74    Wt  Readings from Last 3 Encounters:  02/07/20 147 lb 9.6 oz (67 kg)  12/23/19 145 lb (65.8 kg)  10/29/19 145 lb (65.8 kg)    Physical Exam Constitutional:      General: He is not in acute distress.    Appearance: He is well-developed.     Comments: NAD  Eyes:     Conjunctiva/sclera: Conjunctivae normal.     Pupils: Pupils are equal, round, and reactive to light.  Neck:     Thyroid: No thyromegaly.     Vascular: No JVD.  Cardiovascular:     Rate and Rhythm: Normal rate and regular rhythm.     Heart sounds: Normal heart sounds. No murmur heard.  No friction rub. No gallop.   Pulmonary:     Effort: Pulmonary effort is normal. No respiratory distress.     Breath sounds: Normal breath sounds. No wheezing or rales.  Chest:     Chest wall: No tenderness.  Abdominal:     General: Bowel sounds are normal. There is no distension.     Palpations: Abdomen is soft. There is no mass.     Tenderness: There is no abdominal tenderness. There is no guarding or rebound.  Musculoskeletal:        General: Tenderness and deformity present. Normal range of motion.     Cervical back: Normal range of motion.  Lymphadenopathy:     Cervical: No cervical adenopathy.  Skin:    General: Skin is warm and dry.     Findings: No rash.  Neurological:     Mental Status: He is alert and oriented to person, place, and time.     Cranial Nerves: No cranial nerve deficit.     Motor: No abnormal muscle tone.     Coordination: Coordination normal.     Gait: Gait abnormal.     Deep Tendon Reflexes: Reflexes are normal and symmetric.  Psychiatric:        Behavior: Behavior normal.        Thought Content: Thought content normal.        Judgment: Judgment normal.    RA deformities  Lab Results  Component Value Date   WBC 7.8 10/29/2019   HGB 12.3 (L) 10/29/2019   HCT 36.8 (L) 10/29/2019   PLT 194 10/29/2019   GLUCOSE 100 (H) 10/29/2019   CHOL 128 03/10/2015   TRIG 95.0 03/10/2015   HDL 39.30 03/10/2015     LDLCALC 70 03/10/2015   ALT 10 10/29/2019   AST 13 (L) 10/29/2019   NA 127 (L) 10/29/2019   K 4.0 10/29/2019   CL 93 (L) 10/29/2019   CREATININE 1.18 10/29/2019   BUN 29 (H) 10/29/2019   CO2 28 10/29/2019   TSH 0.680 09/18/2017   PSA 1.95 10/15/2010   INR 1.1 (H) 06/14/2014  HGBA1C 6.0 09/28/2015    CT THORACIC SPINE W CONTRAST  Result Date: 10/14/2019 CLINICAL DATA:  Spondylosis without myelopathy. Chronic back pain and bilateral leg symptoms. FLUOROSCOPY TIME:  1 minutes 56 seconds. 508.29 micro gray meter squared PROCEDURE: LUMBAR PUNCTURE FOR THORACIC AND LUMBAR MYELOGRAM After thorough discussion of risks and benefits of the procedure including bleeding, infection, injury to nerves, blood vessels, adjacent structures as well as headache and CSF leak, written and oral informed consent was obtained. Consent was obtained by Dr. Nelson Chimes. Patient was positioned prone on the fluoroscopy table. Local anesthesia was provided with 1% lidocaine without epinephrine after prepped and draped in the usual sterile fashion. Puncture was performed at L5 using a 3 1/2 inch 22-gauge spinal needle via right paramedian approach. Using a single pass through the dura, the needle was placed within the thecal sac, with return of clear CSF. 10 mL of Isovue M-300 was injected into the thecal sac, with normal opacification of the nerve roots and cauda equina consistent with free flow within the subarachnoid space. The patient was then moved to the trendelenburg position and contrast flowed into the Thoracic spine region. I personally performed the lumbar puncture and administered the intrathecal contrast. I also personally performed acquisition of the myelogram images. TECHNIQUE: Contiguous axial images were obtained through the Thoracic and Lumbar spine after the intrathecal infusion of infusion. Coronal and sagittal reconstructions were obtained of the axial image sets. FINDINGS: THORACIC AND LUMBAR MYELOGRAM  FINDINGS: Lumbar region: Previous decompression and fusion procedure from L1-S1. Chronic screw fractures at S1. Wide patency of the spinal canal throughout that region. Flexion extension views do not show any visible lumbar motion. Thoracic region: Thoracic curvature convex to the right. 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 No apparent compressive central canal stenosis. See results of CT scan. CT THORACIC MYELOGRAM FINDINGS: Thoracic curvature convex to the right with the apex at about T8. Chronic degenerative anterolisthesis at C7-T1, with apparent facet joint effusion. No significant disc pathology at T1-2 or T2-3. From T3-4 through T12-L1, the thoracic discs show degeneration with loss of disc height and areas of vacuum phenomenon. Small endplate osteophytes. No compressive stenosis of the thoracic spinal canal. Foramina are sufficiently patent. Ordinary mild thoracic facet osteoarthritis without pronounced hypertrophic change. CT LUMBAR MYELOGRAM FINDINGS: T12-L1: Mild bulging of the disc. No canal or foraminal stenosis. Conus tip is at L1. L1-2: Previous fusion procedure with pedicle screws and posterior rods. Screw on the left at L1 appears to breach the medial cortex of the pedicle on a chronic basis but does not cause visible neural compression. L2-3: Previous fusion procedure with pedicle screws and posterior rods. Wide patency of the canal and foramina. Right-sided L3 screw breeches the medial cortex of the pedicle without distinct neural compression. L3-4: Previous fusion procedure with pedicle screws and posterior rods. Wide patency of the canal. Right-sided pedicle screw at L4 breaches the medial pedicle and traverses the right lateral recess. Loss of the bracket attachment of the right screw to the posterior rod. L4-5: Previous fusion procedure with pedicle screws and posterior rods. Wide patency of the canal and foramina. 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 L5-S1: Previous fusion procedure with pedicle screws and posterior rods. Chronic S1 screw fractures. There may not be solid union at this level, with potential for ongoing motion. No compressive canal stenosis. Mild bilateral foraminal narrowing, not grossly compressive. 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 Contrast is excluded  from the tip of the thecal sac. IMPRESSION: Lumbar region: Previous posterior decompression and fusion procedure from L1 to the sacrum. Solid union from L1 through L5. Possible nonunion at L5-S1. Sufficient patency of the canal and foramina. Some of the pedicle screws do breach the medial pedicle cortices as outlined above. Chronic S1 screw fractures. Right screw at L4 is no longer fully attached by the bracket to the posterior rod. 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 Distal thecal sac is excluded from the contrast by pronounced arachnoiditis. 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 Mild thoracic spinal curvature. No compressive stenosis of the spinal canal or likely significant foraminal stenosis. Some facet osteoarthritis could contribute to back pain. Electronically Signed   By: Nelson Chimes M.D.   On: 10/14/2019 11:04   CT LUMBAR SPINE W CONTRAST  Result Date: 10/14/2019 CLINICAL DATA:  Spondylosis without myelopathy. Chronic back pain and bilateral leg symptoms. FLUOROSCOPY TIME:  1 minutes 56 seconds. 508.29 micro gray meter squared PROCEDURE: LUMBAR PUNCTURE FOR THORACIC AND LUMBAR MYELOGRAM After thorough discussion of risks and benefits of the procedure including bleeding, infection, injury to nerves, blood vessels, adjacent structures as well as headache and CSF leak, written and oral informed consent was obtained. Consent was obtained by Dr. Nelson Chimes. Patient was positioned prone on the fluoroscopy table. Local  anesthesia was provided with 1% lidocaine without epinephrine after prepped and draped in the usual sterile fashion. Puncture was performed at L5 using a 3 1/2 inch 22-gauge spinal needle via right paramedian approach. Using a single pass through the dura, the needle was placed within the thecal sac, with return of clear CSF. 10 mL of Isovue M-300 was injected into the thecal sac, with normal opacification of the nerve roots and cauda equina consistent with free flow within the subarachnoid space. The patient was then moved to the trendelenburg position and contrast flowed into the Thoracic spine region. I personally performed the lumbar puncture and administered the intrathecal contrast. I also personally performed acquisition of the myelogram images. TECHNIQUE: Contiguous axial images were obtained through the Thoracic and Lumbar spine after the intrathecal infusion of infusion. Coronal and sagittal reconstructions were obtained of the axial image sets. FINDINGS: THORACIC AND LUMBAR MYELOGRAM FINDINGS: Lumbar region: Previous decompression and fusion procedure from L1-S1. Chronic screw fractures at S1. Wide patency of the spinal canal throughout that region. Flexion extension views do not show any visible lumbar motion. Thoracic region: Thoracic curvature convex to the right. 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 No apparent compressive central canal stenosis. See results of CT scan. CT THORACIC MYELOGRAM FINDINGS: Thoracic curvature convex to the right with the apex at about T8. Chronic degenerative anterolisthesis at C7-T1, with apparent facet joint effusion. No significant disc pathology at T1-2 or T2-3. From T3-4 through T12-L1, the thoracic discs show degeneration with loss of disc height and areas of vacuum phenomenon. Small endplate osteophytes. No compressive stenosis of the thoracic spinal canal. Foramina are sufficiently patent. Ordinary mild  thoracic facet osteoarthritis without pronounced hypertrophic change. CT LUMBAR MYELOGRAM FINDINGS: T12-L1: Mild bulging of the disc. No canal or foraminal stenosis. Conus tip is at L1. L1-2: Previous fusion procedure with pedicle screws and posterior rods. Screw on the left at L1 appears to breach the medial cortex of the pedicle on a chronic basis but does not cause visible neural compression. L2-3: Previous fusion procedure with pedicle screws  and posterior rods. Wide patency of the canal and foramina. Right-sided L3 screw breeches the medial cortex of the pedicle without distinct neural compression. L3-4: Previous fusion procedure with pedicle screws and posterior rods. Wide patency of the canal. Right-sided pedicle screw at L4 breaches the medial pedicle and traverses the right lateral recess. Loss of the bracket attachment of the right screw to the posterior rod. L4-5: Previous fusion procedure with pedicle screws and posterior rods. Wide patency of the canal and foramina. 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 L5-S1: Previous fusion procedure with pedicle screws and posterior rods. Chronic S1 screw fractures. There may not be solid union at this level, with potential for ongoing motion. No compressive canal stenosis. Mild bilateral foraminal narrowing, not grossly compressive. 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 Contrast is excluded from the tip of the thecal sac. IMPRESSION: Lumbar region: Previous posterior decompression and fusion procedure from L1 to the sacrum. Solid union from L1 through L5. Possible nonunion at L5-S1. Sufficient patency of the canal and foramina. Some of the pedicle screws do breach the medial pedicle cortices as outlined above. Chronic S1 screw fractures. Right screw at L4 is no longer fully attached by the bracket to the posterior rod. 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  Distal thecal sac is excluded from the contrast by pronounced arachnoiditis. 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 Mild thoracic spinal curvature. No compressive stenosis of the spinal canal or likely significant foraminal stenosis. Some facet osteoarthritis could contribute to back pain. Electronically Signed   By: Nelson Chimes M.D.   On: 10/14/2019 11:04   DG MYELOGRAPHY LUMBAR INJ MULTI REGION  Result Date: 10/14/2019 CLINICAL DATA:  Spondylosis without myelopathy. Chronic back pain and bilateral leg symptoms. FLUOROSCOPY TIME:  1 minutes 56 seconds. 508.29 micro gray meter squared PROCEDURE: LUMBAR PUNCTURE FOR THORACIC AND LUMBAR MYELOGRAM After thorough discussion of risks and benefits of the procedure including bleeding, infection, injury to nerves, blood vessels, adjacent structures as well as headache and CSF leak, written and oral informed consent was obtained. Consent was obtained by Dr. Nelson Chimes. Patient was positioned prone on the fluoroscopy table. Local anesthesia was provided with 1% lidocaine without epinephrine after prepped and draped in the usual sterile fashion. Puncture was performed at L5 using a 3 1/2 inch 22-gauge spinal needle via right paramedian approach. Using a single pass through the dura, the needle was placed within the thecal sac, with return of clear CSF. 10 mL of Isovue M-300 was injected into the thecal sac, with normal opacification of the nerve roots and cauda equina consistent with free flow within the subarachnoid space. The patient was then moved to the trendelenburg position and contrast flowed into the Thoracic spine region. I personally performed the lumbar puncture and administered the intrathecal contrast. I also personally performed acquisition of the myelogram images. TECHNIQUE: Contiguous axial images were obtained through the Thoracic and Lumbar spine after the intrathecal infusion of infusion. Coronal and sagittal  reconstructions were obtained of the axial image sets. FINDINGS: THORACIC AND LUMBAR MYELOGRAM FINDINGS: Lumbar region: Previous decompression and fusion procedure from L1-S1. Chronic screw fractures at S1. Wide patency of the spinal canal throughout that region. Flexion extension views do not show any visible lumbar motion. Thoracic region: Thoracic curvature convex to the right. 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 No apparent compressive central canal stenosis. See results of  CT scan. CT THORACIC MYELOGRAM FINDINGS: Thoracic curvature convex to the right with the apex at about T8. Chronic degenerative anterolisthesis at C7-T1, with apparent facet joint effusion. No significant disc pathology at T1-2 or T2-3. From T3-4 through T12-L1, the thoracic discs show degeneration with loss of disc height and areas of vacuum phenomenon. Small endplate osteophytes. No compressive stenosis of the thoracic spinal canal. Foramina are sufficiently patent. Ordinary mild thoracic facet osteoarthritis without pronounced hypertrophic change. CT LUMBAR MYELOGRAM FINDINGS: T12-L1: Mild bulging of the disc. No canal or foraminal stenosis. Conus tip is at L1. L1-2: Previous fusion procedure with pedicle screws and posterior rods. Screw on the left at L1 appears to breach the medial cortex of the pedicle on a chronic basis but does not cause visible neural compression. L2-3: Previous fusion procedure with pedicle screws and posterior rods. Wide patency of the canal and foramina. Right-sided L3 screw breeches the medial cortex of the pedicle without distinct neural compression. L3-4: Previous fusion procedure with pedicle screws and posterior rods. Wide patency of the canal. Right-sided pedicle screw at L4 breaches the medial pedicle and traverses the right lateral recess. Loss of the bracket attachment of the right screw to the posterior rod. L4-5: Previous fusion procedure  with pedicle screws and posterior rods. Wide patency of the canal and foramina. 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 L5-S1: Previous fusion procedure with pedicle screws and posterior rods. Chronic S1 screw fractures. There may not be solid union at this level, with potential for ongoing motion. No compressive canal stenosis. Mild bilateral foraminal narrowing, not grossly compressive. 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 Contrast is excluded from the tip of the thecal sac. IMPRESSION: Lumbar region: Previous posterior decompression and fusion procedure from L1 to the sacrum. Solid union from L1 through L5. Possible nonunion at L5-S1. Sufficient patency of the canal and foramina. Some of the pedicle screws do breach the medial pedicle cortices as outlined above. Chronic S1 screw fractures. Right screw at L4 is no longer fully attached by the bracket to the posterior rod. 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 Distal thecal sac is excluded from the contrast by pronounced arachnoiditis. 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 Mild thoracic spinal curvature. No compressive stenosis of the spinal canal or likely significant foraminal stenosis. Some facet osteoarthritis could contribute to back pain. Electronically Signed   By: Nelson Chimes M.D.   On: 10/14/2019 11:04    Assessment & Plan:    [name], MD

## 2020-02-07 NOTE — Assessment & Plan Note (Addendum)
Chronic pain Reduce Deltasone to 15 mg/d  Potential benefits of a long term opioids use as well as potential risks (i.e. addiction risk, apnea etc) and complications (i.e. Somnolence, constipation and others) were explained to the patient and were aknowledged. ?trekking poles for walking

## 2020-02-09 ENCOUNTER — Encounter: Payer: Self-pay | Admitting: Family

## 2020-02-09 ENCOUNTER — Telehealth: Payer: Self-pay | Admitting: Family

## 2020-02-09 ENCOUNTER — Inpatient Hospital Stay (HOSPITAL_BASED_OUTPATIENT_CLINIC_OR_DEPARTMENT_OTHER): Payer: Medicare Other | Admitting: Family

## 2020-02-09 ENCOUNTER — Inpatient Hospital Stay: Payer: Medicare Other

## 2020-02-09 ENCOUNTER — Inpatient Hospital Stay: Payer: Medicare Other | Attending: Hematology & Oncology

## 2020-02-09 ENCOUNTER — Other Ambulatory Visit: Payer: Self-pay

## 2020-02-09 VITALS — BP 128/78 | HR 58 | Temp 98.0°F | Resp 18

## 2020-02-09 DIAGNOSIS — Z79899 Other long term (current) drug therapy: Secondary | ICD-10-CM | POA: Insufficient documentation

## 2020-02-09 DIAGNOSIS — C4372 Malignant melanoma of left lower limb, including hip: Secondary | ICD-10-CM

## 2020-02-09 DIAGNOSIS — D509 Iron deficiency anemia, unspecified: Secondary | ICD-10-CM | POA: Diagnosis not present

## 2020-02-09 DIAGNOSIS — Z85820 Personal history of malignant melanoma of skin: Secondary | ICD-10-CM | POA: Diagnosis not present

## 2020-02-09 DIAGNOSIS — I2510 Atherosclerotic heart disease of native coronary artery without angina pectoris: Secondary | ICD-10-CM

## 2020-02-09 DIAGNOSIS — Z95828 Presence of other vascular implants and grafts: Secondary | ICD-10-CM

## 2020-02-09 DIAGNOSIS — D500 Iron deficiency anemia secondary to blood loss (chronic): Secondary | ICD-10-CM

## 2020-02-09 LAB — RETICULOCYTES
Immature Retic Fract: 9.8 % (ref 2.3–15.9)
RBC.: 3.97 MIL/uL — ABNORMAL LOW (ref 4.22–5.81)
Retic Count, Absolute: 88.5 10*3/uL (ref 19.0–186.0)
Retic Ct Pct: 2.2 % (ref 0.4–3.1)

## 2020-02-09 LAB — CMP (CANCER CENTER ONLY)
ALT: 11 U/L (ref 0–44)
AST: 12 U/L — ABNORMAL LOW (ref 15–41)
Albumin: 3.7 g/dL (ref 3.5–5.0)
Alkaline Phosphatase: 52 U/L (ref 38–126)
Anion gap: 6 (ref 5–15)
BUN: 21 mg/dL (ref 8–23)
CO2: 33 mmol/L — ABNORMAL HIGH (ref 22–32)
Calcium: 9.1 mg/dL (ref 8.9–10.3)
Chloride: 94 mmol/L — ABNORMAL LOW (ref 98–111)
Creatinine: 0.88 mg/dL (ref 0.61–1.24)
GFR, Estimated: >60 mL/min (ref >60)
Glucose, Bld: 97 mg/dL (ref 70–99)
Potassium: 4.1 mmol/L (ref 3.5–5.1)
Sodium: 133 mmol/L — ABNORMAL LOW (ref 135–145)
Total Bilirubin: 0.7 mg/dL (ref 0.3–1.2)
Total Protein: 6.2 g/dL — ABNORMAL LOW (ref 6.5–8.1)

## 2020-02-09 LAB — CBC WITH DIFFERENTIAL (CANCER CENTER ONLY)
Abs Immature Granulocytes: 0.09 10*3/uL — ABNORMAL HIGH (ref 0.00–0.07)
Basophils Absolute: 0 10*3/uL (ref 0.0–0.1)
Basophils Relative: 0 %
Eosinophils Absolute: 0 10*3/uL (ref 0.0–0.5)
Eosinophils Relative: 0 %
HCT: 38.2 % — ABNORMAL LOW (ref 39.0–52.0)
Hemoglobin: 12.7 g/dL — ABNORMAL LOW (ref 13.0–17.0)
Immature Granulocytes: 1 %
Lymphocytes Relative: 18 %
Lymphs Abs: 1.4 10*3/uL (ref 0.7–4.0)
MCH: 31.8 pg (ref 26.0–34.0)
MCHC: 33.2 g/dL (ref 30.0–36.0)
MCV: 95.5 fL (ref 80.0–100.0)
Monocytes Absolute: 0.5 10*3/uL (ref 0.1–1.0)
Monocytes Relative: 7 %
Neutro Abs: 5.8 10*3/uL (ref 1.7–7.7)
Neutrophils Relative %: 74 %
Platelet Count: 194 10*3/uL (ref 150–400)
RBC: 4 MIL/uL — ABNORMAL LOW (ref 4.22–5.81)
RDW: 13.2 % (ref 11.5–15.5)
WBC Count: 7.7 10*3/uL (ref 4.0–10.5)
nRBC: 0 % (ref 0.0–0.2)

## 2020-02-09 MED ORDER — HEPARIN SOD (PORK) LOCK FLUSH 100 UNIT/ML IV SOLN
500.0000 [IU] | Freq: Once | INTRAVENOUS | Status: AC
  Administered 2020-02-09: 500 [IU] via INTRAVENOUS
  Filled 2020-02-09: qty 5

## 2020-02-09 MED ORDER — SODIUM CHLORIDE 0.9% FLUSH
10.0000 mL | Freq: Once | INTRAVENOUS | Status: AC
  Administered 2020-02-09: 10 mL via INTRAVENOUS
  Filled 2020-02-09: qty 10

## 2020-02-09 NOTE — Addendum Note (Signed)
Addended by: Shelda Altes on: 02/09/2020 11:11 AM   Modules accepted: Orders

## 2020-02-09 NOTE — Progress Notes (Signed)
Patient stable at discharge.

## 2020-02-09 NOTE — Progress Notes (Signed)
Hematology and Oncology Follow Up Visit  [name] [id number] 1945-11-24 74 y.o. 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 He is doing well and states that he is having good results with PT. He doe therapy 3 days a week.  He notes some SOB with over exertion at times and will take a break to rest when needed.  Dr. Sarajane Jews with [facility] dermatology removed a precancerous lesion from the bridge of his nose using the Moh's procedure recently. His stitches are intact. No drainage, redness or edema noted on exam.  No adenopathy on exam.  No fever, chills, n/v, cough, rash, chest pain, palpitations, abdominal pain or changes in bowel or bladder habits.  He has history of vertigo and does experience episodes of dizziness.  He notes that his neuropathy seems to be getting better.  He has occasional puffiness in his feet and ankles. This comes and goes. He avoids salt.  No falls or syncopal episodes to report.  He states that his appetite is better but does note lots of gas when he eats. He will try taking his Protonix daily again. He had been only taking sporadically. He is doing his best to stay well hydrated. His weight is stable at 146 lbs.   ECOG Performance Status: 1 - Symptomatic but completely ambulatory  Medications:  Allergies as of 02/09/2020      Reactions   Cefepime Hives, Shortness Of Breath   Cephaeline Hives   Morphine Swelling   A swollen stomach.   Morphine And Related Shortness Of Breath, Nausea And Vomiting, Swelling, Other (See Comments)   Agitation, tolerates  dilaudid Other reaction(s): Other (See Comments) Agitation, tolerates dilaudid   Penicillins Hives, Shortness Of Breath, Rash   Has patient had a PCN reaction causing immediate rash, facial/tongue/throat swelling, SOB or lightheadedness with hypotension: Yes Has patient had a PCN reaction causing severe rash involving mucus membranes or skin necrosis: Yes Has patient had a PCN reaction that required hospitalization No Has patient had a PCN reaction occurring within the last 10 years: No If all of the above answers are "NO", then may proceed with Cephalosporin use.   Doxycycline Rash   Oxycodone-acetaminophen Other (See Comments)   Patient doesn't remember what type of reaction.      Medication List       Accurate as of February 09, 2020 10:10 AM. If you have any questions, ask your nurse or doctor.        albuterol 108 (90 Base) MCG/ACT inhaler Commonly known as: VENTOLIN HFA Inhale 1-2 puffs into the lungs every 4 (four) hours as needed for wheezing or shortness of breath.   ALPRAZolam 0.5 MG tablet Commonly known as: XANAX Take 1 tablet (0.5 mg total) by mouth 3 (three) times daily as needed. for anxiety   B-6 PO Take 1 tablet by mouth.   Cialis 5 MG tablet Generic drug: tadalafil Take 5 mg by mouth as needed.   clopidogrel 75 MG tablet Commonly known as: PLAVIX TAKE 1 TABLET BY MOUTH EVERY DAY WITH BREAKFAST   Fish Oil Omega-3 1000 MG Caps Take 1,000 mg by mouth daily.   FLUoxetine 40  MG capsule Commonly known as: PROZAC TAKE 1 CAPSULE BY MOUTH TWICE DAILY   fluticasone 50 MCG/ACT nasal spray Commonly known as: FLONASE INSTILL 2 SPRAYS IN EACH NOSTRIL EVERY DAY   gabapentin 300 MG capsule Commonly known as: NEURONTIN TAKE 1 CAPSULE(300 MG) BY MOUTH TWICE DAILY   HYDROmorphone 2 MG tablet Commonly known as: DILAUDID Take 1-1.5 tablets (2-3 mg total) by mouth every 8 (eight) hours as needed for severe pain.   lidocaine-prilocaine cream Commonly known as:  EMLA Apply 1 application topically as needed.   losartan-hydrochlorothiazide 50-12.5 MG tablet Commonly known as: HYZAAR TAKE 1 TABLET BY MOUTH DAILY   melatonin 5 MG Tabs Take 5 mg by mouth at bedtime. Takes 10 mg at HS   metoprolol tartrate 25 MG tablet Commonly known as: LOPRESSOR TAKE 1 TABLET (25 MG TOTAL) BY MOUTH 2 (TWO) TIMES DAILY.   ondansetron 8 MG tablet Commonly known as: ZOFRAN Take 1 tablet (8 mg total) by mouth 3 (three) times daily.   pantoprazole 40 MG tablet Commonly known as: PROTONIX TAKE 1 TABLET (40 MG TOTAL) BY MOUTH EVERY EVENING.   pravastatin 20 MG tablet Commonly known as: PRAVACHOL Take 1 tablet (20 mg total) by mouth daily.   predniSONE 10 MG tablet Commonly known as: DELTASONE 20 mg po qam pc or as directed   predniSONE 5 MG tablet Commonly known as: DELTASONE Take 3 tablets (15 mg total) by mouth daily with breakfast.   prochlorperazine 10 MG tablet Commonly known as: COMPAZINE TAKE 1 TABLET(10 MG) BY MOUTH EVERY 6 HOURS AS NEEDED FOR NAUSEA OR VOMITING   torsemide 10 MG tablet Commonly known as: Demadex Take 1-2 tablets (10-20 mg total) by mouth daily.   traZODone 100 MG tablet Commonly known as: DESYREL TAKE 1 TABLET AT BEDTIME   vitamin B-12 1000 MCG tablet Commonly known as: CYANOCOBALAMIN Take 1,000 mcg by mouth daily.   Vitamin D3 50 MCG (2000 UT) capsule Take 1 capsule (2,000 Units total) by mouth daily.       Allergies:  Allergies  Allergen Reactions  . Cefepime Hives and Shortness Of Breath  . Cephaeline Hives  . Morphine Swelling    A swollen stomach.  . Morphine And Related Shortness Of Breath, Nausea And Vomiting, Swelling and Other (See Comments)    Agitation, tolerates dilaudid Other reaction(s): Other (See Comments) Agitation, tolerates dilaudid  . Penicillins Hives, Shortness Of Breath and Rash    Has patient had a PCN reaction causing immediate rash, facial/tongue/throat swelling, SOB or  lightheadedness with hypotension: Yes Has patient had a PCN reaction causing severe rash involving mucus membranes or skin necrosis: Yes Has patient had a PCN reaction that required hospitalization No Has patient had a PCN reaction occurring within the last 10 years: No If all of the above answers are "NO", then may proceed with Cephalosporin use.   [name] Doxycycline Rash  . Oxycodone-Acetaminophen Other (See Comments)    Patient doesn't remember what type of reaction.    Past Medical History, Surgical history, Social history, and Family History were reviewed and updated.  Review of Systems: All other 10 point review of systems is negative.   Physical Exam:  height is 5' 6" (1.676 m) and weight is 146 lb (66.2 kg). His oral temperature is 98 F (36.7 C). His blood pressure is 128/78 and his pulse is 58 (abnormal). His respiration is 16 and oxygen saturation is 97%.   Wt Readings from Last 3 Encounters:  02/09/20 146 lb (66.2  kg)  02/07/20 147 lb 9.6 oz (67 kg)  12/23/19 145 lb (65.8 kg)    Ocular: Sclerae unicteric, pupils equal, round and reactive to light Ear-nose-throat: Oropharynx clear, dentition fair Lymphatic: No cervical, supraclavicular or axillary adenopathy Lungs no rales or rhonchi, good excursion bilaterally Heart regular rate and rhythm, no murmur appreciated Abd soft, nontender, positive bowel sounds, no liver or spleen tip palpated on exam, no fluid wave  MSK no focal spinal tenderness, no joint edema Neuro: non-focal, well-oriented, appropriate affect Breasts: Deferred   Lab Results  Component Value Date   WBC 7.7 02/09/2020   HGB 12.7 (L) 02/09/2020   HCT 38.2 (L) 02/09/2020   MCV 95.5 02/09/2020   PLT 194 02/09/2020   Lab Results  Component Value Date   FERRITIN 78 10/29/2019   IRON 87 10/29/2019   TIBC 198 (L) 10/29/2019   UIBC 111 (L) 10/29/2019   IRONPCTSAT 44 10/29/2019   Lab Results  Component Value Date   RETICCTPCT 2.2 02/09/2020   RBC 3.97  (L) 02/09/2020   No results found for: KPAFRELGTCHN, LAMBDASER, KAPLAMBRATIO No results found for: IGGSERUM, IGA, IGMSERUM No results found for: Kathrynn Ducking, MSPIKE, SPEI   Chemistry      Component Value Date/Time   NA 127 (L) 10/29/2019 1035   NA 140 04/10/2017 1046   NA 135 (L) 03/06/2017 1104   K 4.0 10/29/2019 1035   K 3.7 04/10/2017 1046   K 4.2 03/06/2017 1104   CL 93 (L) 10/29/2019 1035   CL 98 04/10/2017 1046   CO2 28 10/29/2019 1035   CO2 31 04/10/2017 1046   CO2 30 (H) 03/06/2017 1104   BUN 29 (H) 10/29/2019 1035   BUN 21 04/10/2017 1046   BUN 20.0 03/06/2017 1104   CREATININE 1.18 10/29/2019 1035   CREATININE 0.9 04/10/2017 1046   CREATININE 1.0 03/06/2017 1104      Component Value Date/Time   CALCIUM 9.3 10/29/2019 1035   CALCIUM 8.9 04/10/2017 1046   CALCIUM 9.4 03/06/2017 1104   ALKPHOS 64 10/29/2019 1035   ALKPHOS 65 04/10/2017 1046   ALKPHOS 78 03/06/2017 1104   AST 13 (L) 10/29/2019 1035   AST 20 03/06/2017 1104   ALT 10 10/29/2019 1035   ALT 21 04/10/2017 1046   ALT 20 03/06/2017 1104   BILITOT 0.6 10/29/2019 1035   BILITOT 0.47 03/06/2017 1104       Impression and Plan: Mr. Villanueva is a very pleasant 74yo caucasian gentleman with history of hemochromatosis,homozygous for the C282Y mutation which has not been a problem for him so far.  Iron studies from today are pending. We will schedule him for phlebotomy is needed.  He also has history of stage IIIC nodular melanoma of the left thigh. He completed treatment with Opdivo in October 2019.He continues to do well and so far there has been no evidence of recurrence.  Follow-up in 3 months. Port flush every 6 weeks.  He was encouraged to contact our office with any questions or concerns.   Laverna Peace, NP 11/3/202110:10 AM

## 2020-02-09 NOTE — Telephone Encounter (Signed)
Appointments scheduled calendar printed per 11/3 los

## 2020-02-10 LAB — IRON AND TIBC
Iron: 178 ug/dL — ABNORMAL HIGH (ref 42–163)
Saturation Ratios: 93 % — ABNORMAL HIGH (ref 20–55)
TIBC: 190 ug/dL — ABNORMAL LOW (ref 202–409)
UIBC: 12 ug/dL — ABNORMAL LOW (ref 117–376)

## 2020-02-10 LAB — FERRITIN: Ferritin: 98 ng/mL (ref 24–336)

## 2020-02-11 ENCOUNTER — Inpatient Hospital Stay: Payer: Medicare Other

## 2020-02-11 ENCOUNTER — Other Ambulatory Visit: Payer: Self-pay

## 2020-02-11 VITALS — BP 133/67 | HR 62 | Temp 98.0°F | Resp 17

## 2020-02-11 DIAGNOSIS — Z95828 Presence of other vascular implants and grafts: Secondary | ICD-10-CM

## 2020-02-11 DIAGNOSIS — Z85820 Personal history of malignant melanoma of skin: Secondary | ICD-10-CM | POA: Diagnosis not present

## 2020-02-11 DIAGNOSIS — Z79899 Other long term (current) drug therapy: Secondary | ICD-10-CM | POA: Diagnosis not present

## 2020-02-11 DIAGNOSIS — D509 Iron deficiency anemia, unspecified: Secondary | ICD-10-CM | POA: Diagnosis not present

## 2020-02-11 MED ORDER — HEPARIN SOD (PORK) LOCK FLUSH 100 UNIT/ML IV SOLN
500.0000 [IU] | Freq: Once | INTRAVENOUS | Status: AC
  Administered 2020-02-11: 500 [IU] via INTRAVENOUS
  Filled 2020-02-11: qty 5

## 2020-02-11 MED ORDER — SODIUM CHLORIDE 0.9% FLUSH
10.0000 mL | Freq: Once | INTRAVENOUS | Status: AC
  Administered 2020-02-11: 10 mL via INTRAVENOUS
  Filled 2020-02-11: qty 10

## 2020-02-11 NOTE — Progress Notes (Signed)
[name] presents today for phlebotomy per MD orders. Phlebotomy procedure started at 1418 and ended at 1435. 500 grams removed via 19 gauge port-a-cath by Dixie RN. Patient observed for 30 minutes after procedure without any incident. Patient tolerated procedure well. IV needle removed intact.  Pt discharged in stable condition.

## 2020-02-11 NOTE — Patient Instructions (Signed)

## 2020-02-14 ENCOUNTER — Telehealth: Payer: Self-pay | Admitting: *Deleted

## 2020-02-14 DIAGNOSIS — Z1159 Encounter for screening for other viral diseases: Secondary | ICD-10-CM | POA: Diagnosis not present

## 2020-02-14 DIAGNOSIS — C4372 Malignant melanoma of left lower limb, including hip: Secondary | ICD-10-CM

## 2020-02-14 DIAGNOSIS — Z20828 Contact with and (suspected) exposure to other viral communicable diseases: Secondary | ICD-10-CM | POA: Diagnosis not present

## 2020-02-14 DIAGNOSIS — M6281 Muscle weakness (generalized): Secondary | ICD-10-CM | POA: Diagnosis not present

## 2020-02-14 DIAGNOSIS — D500 Iron deficiency anemia secondary to blood loss (chronic): Secondary | ICD-10-CM

## 2020-02-14 DIAGNOSIS — M5459 Other low back pain: Secondary | ICD-10-CM | POA: Diagnosis not present

## 2020-02-14 DIAGNOSIS — M25572 Pain in left ankle and joints of left foot: Secondary | ICD-10-CM | POA: Diagnosis not present

## 2020-02-14 NOTE — Telephone Encounter (Signed)
Call received from patient concerned about his elevated iron saturation ratio-93.  Lab results from 02/09/20 reviewed by Dr. Marin Olp.  Call placed back to patient and patient notified per order of Dr. Marin Olp that Dr. Marin Olp would like for pt to have one more phlebotomy and to have lab work drawn with port flush on 03/22/20.  Message sent to scheduling.

## 2020-02-16 ENCOUNTER — Other Ambulatory Visit: Payer: Self-pay | Admitting: Internal Medicine

## 2020-02-16 DIAGNOSIS — M5459 Other low back pain: Secondary | ICD-10-CM | POA: Diagnosis not present

## 2020-02-16 DIAGNOSIS — M25572 Pain in left ankle and joints of left foot: Secondary | ICD-10-CM | POA: Diagnosis not present

## 2020-02-16 DIAGNOSIS — M6281 Muscle weakness (generalized): Secondary | ICD-10-CM | POA: Diagnosis not present

## 2020-02-16 DIAGNOSIS — Z23 Encounter for immunization: Secondary | ICD-10-CM | POA: Diagnosis not present

## 2020-02-18 ENCOUNTER — Inpatient Hospital Stay: Payer: Medicare Other

## 2020-02-18 ENCOUNTER — Other Ambulatory Visit: Payer: Self-pay

## 2020-02-18 DIAGNOSIS — M150 Primary generalized (osteo)arthritis: Secondary | ICD-10-CM | POA: Diagnosis not present

## 2020-02-18 DIAGNOSIS — Z85820 Personal history of malignant melanoma of skin: Secondary | ICD-10-CM | POA: Diagnosis not present

## 2020-02-18 DIAGNOSIS — Z6824 Body mass index (BMI) 24.0-24.9, adult: Secondary | ICD-10-CM | POA: Diagnosis not present

## 2020-02-18 DIAGNOSIS — Z79899 Other long term (current) drug therapy: Secondary | ICD-10-CM | POA: Diagnosis not present

## 2020-02-18 DIAGNOSIS — M25511 Pain in right shoulder: Secondary | ICD-10-CM | POA: Diagnosis not present

## 2020-02-18 DIAGNOSIS — M1120 Other chondrocalcinosis, unspecified site: Secondary | ICD-10-CM | POA: Diagnosis not present

## 2020-02-18 DIAGNOSIS — D6489 Other specified anemias: Secondary | ICD-10-CM | POA: Diagnosis not present

## 2020-02-18 DIAGNOSIS — R0600 Dyspnea, unspecified: Secondary | ICD-10-CM | POA: Diagnosis not present

## 2020-02-18 DIAGNOSIS — M25531 Pain in right wrist: Secondary | ICD-10-CM | POA: Diagnosis not present

## 2020-02-18 DIAGNOSIS — C4372 Malignant melanoma of left lower limb, including hip: Secondary | ICD-10-CM | POA: Diagnosis not present

## 2020-02-18 DIAGNOSIS — D509 Iron deficiency anemia, unspecified: Secondary | ICD-10-CM | POA: Diagnosis not present

## 2020-02-18 DIAGNOSIS — M0579 Rheumatoid arthritis with rheumatoid factor of multiple sites without organ or systems involvement: Secondary | ICD-10-CM | POA: Diagnosis not present

## 2020-02-18 DIAGNOSIS — M5136 Other intervertebral disc degeneration, lumbar region: Secondary | ICD-10-CM | POA: Diagnosis not present

## 2020-02-18 NOTE — Patient Instructions (Signed)
Therapeutic Phlebotomy Therapeutic phlebotomy is the planned removal of blood from a person's body for the purpose of treating a medical condition. The procedure is similar to donating blood. Usually, about a pint (470 mL, or 0.47 L) of blood is removed. The average adult has 9-12 pints (4.3-5.7 L) of blood in the body. Therapeutic phlebotomy may be used to treat the following medical conditions:  Hemochromatosis. This is a condition in which the blood contains too much iron.  Polycythemia vera. This is a condition in which the blood contains too many red blood cells.  Porphyria cutanea tarda. This is a disease in which an important part of hemoglobin is not made properly. It results in the buildup of abnormal amounts of porphyrins in the body.  Sickle cell disease. This is a condition in which the red blood cells form an abnormal crescent shape rather than a round shape. Tell a health care provider about:  Any allergies you have.  All medicines you are taking, including vitamins, herbs, eye drops, creams, and over-the-counter medicines.  Any problems you or family members have had with anesthetic medicines.  Any blood disorders you have.  Any surgeries you have had.  Any medical conditions you have.  Whether you are pregnant or may be pregnant. What are the risks? Generally, this is a safe procedure. However, problems may occur, including:  Nausea or light-headedness.  Low blood pressure (hypotension).  Soreness, bleeding, swelling, or bruising at the needle insertion site.  Infection. What happens before the procedure?  Follow instructions from your health care provider about eating or drinking restrictions.  Ask your health care provider about: ? Changing or stopping your regular medicines. This is especially important if you are taking diabetes medicines or blood thinners (anticoagulants). ? Taking medicines such as aspirin and ibuprofen. These medicines can thin your  blood. Do not take these medicines unless your health care provider tells you to take them. ? Taking over-the-counter medicines, vitamins, herbs, and supplements.  Wear clothing with sleeves that can be raised above the elbow.  Plan to have someone take you home from the hospital or clinic.  You may have a blood sample taken.  Your blood pressure, pulse rate, and breathing rate will be measured. What happens during the procedure?   To lower your risk of infection: ? Your health care team will wash or sanitize their hands. ? Your skin will be cleaned with an antiseptic.  You may be given a medicine to numb the area (local anesthetic).  A tourniquet will be placed on your arm.  A needle will be inserted into one of your veins.  Tubing and a collection bag will be attached to that needle.  Blood will flow through the needle and tubing into the collection bag.  The collection bag will be placed lower than your arm to allow gravity to help the flow of blood into the bag.  You may be asked to open and close your hand slowly and continually during the entire collection.  After the specified amount of blood has been removed from your body, the collection bag and tubing will be clamped.  The needle will be removed from your vein.  Pressure will be held on the site of the needle insertion to stop the bleeding.  A bandage (dressing) will be placed over the needle insertion site. The procedure may vary among health care providers and hospitals. What happens after the procedure?  Your blood pressure, pulse rate, and breathing rate will be   measured after the procedure.  You will be encouraged to drink fluids.  Your recovery will be assessed and monitored.  You can return to your normal activities as told by your health care provider. Summary  Therapeutic phlebotomy is the planned removal of blood from a person's body for the purpose of treating a medical condition.  Therapeutic  phlebotomy may be used to treat hemochromatosis, polycythemia vera, porphyria cutanea tarda, or sickle cell disease.  In the procedure, a needle is inserted and about a pint (470 mL, or 0.47 L) of blood is removed. The average adult has 9-12 pints (4.3-5.7 L) of blood in the body.  This is generally a safe procedure, but it can sometimes cause problems such as nausea, light-headedness, or low blood pressure (hypotension). This information is not intended to replace advice given to you by your health care provider. Make sure you discuss any questions you have with your health care provider. Document Revised: 04/10/2017 Document Reviewed: 04/10/2017 Elsevier Patient Education  2020 [employer]  

## 2020-02-18 NOTE — Progress Notes (Signed)
[name] presents today for phlebotomy per MD orders. Phlebotomy procedure started at 1215 and ended at 1230 500 grams removed. Patient observed for 30 minutes after procedure without any incident. Patient tolerated procedure well. IV needle removed intact.

## 2020-02-21 DIAGNOSIS — M25572 Pain in left ankle and joints of left foot: Secondary | ICD-10-CM | POA: Diagnosis not present

## 2020-02-21 DIAGNOSIS — M6281 Muscle weakness (generalized): Secondary | ICD-10-CM | POA: Diagnosis not present

## 2020-02-21 DIAGNOSIS — M5459 Other low back pain: Secondary | ICD-10-CM | POA: Diagnosis not present

## 2020-02-21 DIAGNOSIS — Z1159 Encounter for screening for other viral diseases: Secondary | ICD-10-CM | POA: Diagnosis not present

## 2020-02-21 DIAGNOSIS — Z20828 Contact with and (suspected) exposure to other viral communicable diseases: Secondary | ICD-10-CM | POA: Diagnosis not present

## 2020-02-23 DIAGNOSIS — M5459 Other low back pain: Secondary | ICD-10-CM | POA: Diagnosis not present

## 2020-02-23 DIAGNOSIS — M6281 Muscle weakness (generalized): Secondary | ICD-10-CM | POA: Diagnosis not present

## 2020-02-23 DIAGNOSIS — M25572 Pain in left ankle and joints of left foot: Secondary | ICD-10-CM | POA: Diagnosis not present

## 2020-02-25 DIAGNOSIS — M5459 Other low back pain: Secondary | ICD-10-CM | POA: Diagnosis not present

## 2020-02-25 DIAGNOSIS — M25572 Pain in left ankle and joints of left foot: Secondary | ICD-10-CM | POA: Diagnosis not present

## 2020-02-25 DIAGNOSIS — M6281 Muscle weakness (generalized): Secondary | ICD-10-CM | POA: Diagnosis not present

## 2020-02-25 NOTE — Progress Notes (Signed)
Date:  03/08/2020   ID:  [name], DOB 1945/06/10, MRN [id number]  Patient Location: Home Provider Location:  Office   PCP:  Cassandria Anger, MD  Cardiologist:   Johnsie Cancel Electrophysiologist:  None   Evaluation Performed:  Follow-Up Visit  Chief Complaint:  CAD  History of Present Illness:    74 y.o. male with a hx of CAD, paroxysmal atrial fibrillation not on systemic anticoagulation with no recurrence in years and frequent need for DAT for his cAD , hyperlipidemia, COPD, GERD, obstructive sleep apnea, rheumatoid arthritis, and hemochromatosis.   He had left circumflex stent with spiral dissection in 2006. He had TEE DCCV in May 2012. He had h/o post lumbar fusion surgical wound infection with positive blood culture of pseudomonas and required 6 weeks of IV antibiotics near the end of 2014. He was admitted in March 2016 with chest tightness and dyspnea and underwent cardiac catheterization which showed new stenosis in proximal to mid LAD treated with overlapping drug-eluting stents. He was started on aspirin and Plavix. Sees ortho at [place] Dr. Pasty Spillers.   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    Past Medical History:  Diagnosis Date  . Alcoholism /alcohol abuse    per family  . Allergic rhinitis   . Allergy   . Anxiety   . Bacterial infection   . CAD (coronary artery disease)    minimal coronary plaque in the LAD and right coronary system. PCI of a 95% obtuse marginal  lesion w/ resultant spiral dissection requiring drug-eluting stent placement. 7-06. Last nuclear stress 11-17-06 fixed anterior/ inferior defect, no inducible ischemia, EF 81%  . Cataract   . CHF (congestive heart failure) ([place])   . Chronic back pain    "all over back"  . Chronic neck pain   . COPD (chronic obstructive pulmonary disease) ([place])   . Depression   . Diverticulosis   . Dyspnea    with exertion  . Dysrhythmia 01-24-12   past hx. A.Fib x1 episode-responded to med.  . Falls frequently    "since 02/2013" (06/16/2013)  . Family history of cancer   . Genetic testing 03/06/2017   Multi-Cancer panel (83 genes) @ [facility] - No pathogenic mutations detected  . GERD (gastroesophageal reflux disease)   . Hemochromatosis    dx'd 14 yrs ago last ferritin Aug 11, 08 52 (22-322), Fe 136 ("I had 250 phlebotomies for that")  . High cholesterol    hx  . Hx of colonic polyps   . Hx of colonoscopy   . Hypertension   . Iron deficiency anemia due to chronic blood loss 12/27/2016  . Malignant melanoma of knee, left ([place])   . Myocardial infarction [facility]) 2006   "related to catheterization"  . Narcotic abuse ([place])    per family  . Osteoarthritis   . PONV (postoperative nausea and vomiting)   . RA (rheumatoid arthritis) ([place])    Past Surgical History:  Procedure Laterality Date  .  ABDOMINAL ADHESION SURGERY  ~ 1968  . ANKLE RECONSTRUCTION Right 6-09   [facility]  . APPENDECTOMY  ~ 1956  . ASPIRATION OF ABSCESS Left 03/27/2017   Procedure: ASPIRATION OF SEROMA;  Surgeon: Stark Klein, MD;  Location: [place];  Service: General;  Laterality: Left;  . BONE TUMOR RESECTION  ~ 1954   "taken off my mastoid"  . CARPAL TUNNEL RELEASE Right 1990's  . CATARACT EXTRACTION, BILATERAL Bilateral 01-24-12  . CORONARY ANGIOPLASTY WITH STENT PLACEMENT  2006   "while repairing 1st stent, a second area tore and they had to place 2nd stent " ?LAD & CX  . CORONARY ANGIOPLASTY WITH STENT  PLACEMENT  06/17/2014  . CYST EXCISION  "several OR's"   "backX 2, back of my neck, face, inside right bicept, chest, wrist"  . FOOT SURGERY Right 11-08   for removal of bone spurs-  . HAMMER TOE SURGERY Right 07/2012   "broke 4 hammertoes"   . HARDWARE REMOVAL  03/09/2012   Procedure: HARDWARE REMOVAL;  Surgeon: Nita Sells, MD;  Location: [place];  Service: Orthopedics;  Laterality: Left;  Hardware Removal from Left Shoulder  . HARVEST BONE GRAFT  02/06/2012   Procedure: HARVEST ILIAC BONE GRAFT;  Surgeon: Nita Sells, MD;  Location: [facility];  Service: Orthopedics;;  bone marrow aspirqation   . INGUINAL HERNIA REPAIR Bilateral   . JOINT REPLACEMENT    . KNEE SURGERY Left ~ 2003   "6-12 months after uni knee removed synovial sack"  . LEFT HEART CATHETERIZATION WITH CORONARY ANGIOGRAM N/A 06/17/2014   PCI of diffuse severe stenosis in the proximal to mid LAD using overlapping drug-eluting stents.  . LUMBAR WOUND DEBRIDEMENT N/A 03/17/2013   Procedure: Incision and drainage of superficial lumbar wound;  Surgeon: Floyce Stakes, MD;  Location: [place] [facility];  Service: Neurosurgery;  Laterality: N/A;  Incision and drainage of superficial lumbar wound  . MECKEL DIVERTICULUM EXCISION  ~ 1956  . MELANOMA EXCISION WITH SENTINEL LYMPH NODE BIOPSY Left 02/12/2017   WIDE LOCAL EXCISION LEFT KNEE MELANOMA, ADVANCEMENT FLAP CLOSURE, AND SENTINEL LYMPH NODE MAPPING AND BIOPSY.  [name] MELANOMA EXCISION WITH SENTINEL LYMPH NODE BIOPSY Left 02/12/2017   Procedure: WIDE LOCAL EXCISION LEFT KNEE MELANOMA, ADVANCEMENT FLAP CLOSURE, AND SENTINEL LYMPH NODE MAPPING AND BIOPSY.;  Surgeon: Stark Klein, MD;  Location: [place];  Service: General;  Laterality: Left;  GENERAL AND LOCAL  . ORIF SHOULDER FRACTURE  02/06/2012   Procedure: OPEN REDUCTION INTERNAL FIXATION (ORIF) SHOULDER FRACTURE;  Surgeon: Nita Sells, MD;  Location: [facility];  Service: Orthopedics;   Laterality: Left;  ORIF of a Left Shoulder Fracture with  Iliac Crest Bone Graft aspiration   . PORTACATH PLACEMENT Left 03/27/2017   Procedure: INSERTION PORT-A-CATH;  Surgeon: Stark Klein, MD;  Location: [place];  Service: General;  Laterality: Left;  . POSTERIOR LUMBAR FUSION  12-10   L4-5 diskectomy w/ fusion, cage placement and rods; [place]  . POSTERIOR LUMBAR FUSION 4 LEVEL N/A 03/02/2013   Procedure: Lumbar One to Sacral One Posterior lumbar interbody fusion;  Surgeon: Floyce Stakes, MD;  Location: [place] [facility];  Service: Neurosurgery;  Laterality: N/A;  L1 to S1 Posterior lumbar interbody fusion  . REPLACEMENT UNICONDYLAR JOINT KNEE Left ~ 2003   "~ 6 months after total knee replaced"  . SHOULDER ARTHROSCOPY Left ~ 2004 X 2   "@ [facility]; left bone splinter in & had to clean it out"  .  TOTAL ANKLE REPLACEMENT Right 2008   at [facility]  . TOTAL KNEE ARTHROPLASTY Bilateral 2002  . TOTAL SHOULDER REPLACEMENT Left 2006  . TOTAL SHOULDER REPLACEMENT Right ~ 2007   Dr. Marlou Sa     Current Meds  Medication Sig  . albuterol (PROVENTIL HFA;VENTOLIN HFA) 108 (90 Base) MCG/ACT inhaler Inhale 1-2 puffs into the lungs every 4 (four) hours as needed for wheezing or shortness of breath.  . ALPRAZolam (XANAX) 0.5 MG tablet Take 1 tablet (0.5 mg total) by mouth 3 (three) times daily as needed. for anxiety  . Cholecalciferol (VITAMIN D3) 2000 UNITS capsule Take 1 capsule (2,000 Units total) by mouth daily.  . clopidogrel (PLAVIX) 75 MG tablet TAKE 1 TABLET BY MOUTH EVERY DAY WITH BREAKFAST  . FLUoxetine (PROZAC) 40 MG capsule Take 40 mg by mouth daily.  . fluticasone (FLONASE) 50 MCG/ACT nasal spray INSTILL 2 SPRAYS IN EACH NOSTRIL EVERY DAY  . gabapentin (NEURONTIN) 300 MG capsule TAKE 1 CAPSULE(300 MG) BY MOUTH TWICE DAILY  . HYDROmorphone (DILAUDID) 2 MG tablet Take 1-1.5 tablets (2-3 mg total) by mouth every 8 (eight) hours as needed for severe pain.  [name] lidocaine-prilocaine (EMLA)  cream Apply 1 application topically as needed.  [name] losartan-hydrochlorothiazide (HYZAAR) 50-12.5 MG tablet TAKE 1 TABLET BY MOUTH DAILY  . Melatonin 5 MG TABS Take 5 mg by mouth at bedtime. Takes 10 mg at HS  . metoprolol tartrate (LOPRESSOR) 25 MG tablet TAKE 1 TABLET (25 MG TOTAL) BY MOUTH 2 (TWO) TIMES DAILY.  [name] Omega-3 Fatty Acids (FISH OIL OMEGA-3) 1000 MG CAPS Take 1,000 mg by mouth daily.   . ondansetron (ZOFRAN) 8 MG tablet Take 1 tablet (8 mg total) by mouth 3 (three) times daily.  . pantoprazole (PROTONIX) 40 MG tablet TAKE 1 TABLET (40 MG TOTAL) BY MOUTH EVERY EVENING. (Patient taking differently: Take 40 mg by mouth every evening. Pt takes 1/2 a tablet daily)  . pravastatin (PRAVACHOL) 20 MG tablet Take 1 tablet (20 mg total) by mouth daily.  . predniSONE (DELTASONE) 10 MG tablet 20 mg po qam pc or as directed  . predniSONE (DELTASONE) 5 MG tablet TAKE 2 TABLETS (10 MG TOTAL) BY MOUTH DAILY WITH BREAKFAST.  [name] prochlorperazine (COMPAZINE) 10 MG tablet TAKE 1 TABLET(10 MG) BY MOUTH EVERY 6 HOURS AS NEEDED FOR NAUSEA OR VOMITING  . Pyridoxine HCl (B-6 PO) Take 1 tablet by mouth.   . tadalafil (CIALIS) 5 MG tablet Take 5 mg by mouth as needed.   . torsemide (DEMADEX) 10 MG tablet Take 1-2 tablets (10-20 mg total) by mouth daily.  . traZODone (DESYREL) 100 MG tablet TAKE 1 TABLET AT BEDTIME  . vitamin B-12 (CYANOCOBALAMIN) 1000 MCG tablet Take 1,000 mcg by mouth daily.   . [DISCONTINUED] FLUoxetine (PROZAC) 40 MG capsule TAKE 1 CAPSULE BY MOUTH TWICE DAILY     Allergies:   Cefepime, Cephaeline, Morphine, Morphine and related, Penicillins, Doxycycline, and Oxycodone-acetaminophen   Social History   Tobacco Use  . Smoking status: Never Smoker  . Smokeless tobacco: Never Used  Vaping Use  . Vaping Use: Never used  Substance Use Topics  . Alcohol use: No    Alcohol/week: 0.0 standard drinks    Comment: "I do not drink anymore"  . Drug use: No     Family Hx: The patient's family  history includes Cancer in his maternal grandmother; Cancer - Lung in his mother; Cancer - Prostate (age of onset: 91) in his father; Colon polyps in his father;  Coronary artery disease in his father and maternal aunt; Heart attack in his brother and maternal aunt; Hemochromatosis in his sister; Hyperlipidemia in his brother; Hypertension in his father; Lung cancer in his mother; Macular degeneration in his mother; Melanoma in his mother; Other in his brother and mother; Prostate cancer in his father; Rheum arthritis in his sister; Thyroid disease in his sister; Uterine cancer in his mother. There is no history of Diabetes, Colon cancer, Esophageal cancer, Rectal cancer, or Stomach cancer.  ROS:   Please see the history of present illness.     All other systems reviewed and are negative.   Prior CV studies:   The following studies were reviewed today: Cath 06/17/14 LHC 06/17/14 Coronary angiography: Coronary dominance: right Left mainstem: Normal Left anterior descending (LAD): 80% proximal lesion before D1 50% just distal to D1 80% distal D1 30% multiple discrete Left circumflex (LCx): Primarily AV groove and OM1 Widely patent stents in OM Right coronary artery (RCA): Dominant 30% proximal 50% Mid vessel stenosis  Left ventriculography: Left ventricular systolic function is normal, LVEF is estimated at 55-65%, there is no significant mitral regurgitation  Final Conclusions: New LAD lesions patent stents in OM Recommendations: Reviewed with Dr Burt Knack PCI/Stent mulitple sights in LAD  Conclusions: Successful PCI of diffuse severe stenosis in the proximal to mid LAD using overlapping drug-eluting stent platforms  Recommendations: Dual antiplatelet therapy with aspirin and Plavix for at least 12 months.   Lesion Data: Vessel: LAD/proximal to mid Percent stenosis (pre): 80 TIMI-flow (pre): 3 Stent: 2.5 x 24 mm Promus DES and 3.0 x 20 mm Promus DES (overlapping) Percent  stenosis (post): 0 TIMI-flow (post): 3   Myovue 2016   Labs/Other Tests and Data Reviewed:    EKG:   03/23/18 SR rate 70 old IMI no acute changes 02/24/19 SR rate 65 normal   Recent Labs: 02/09/2020: ALT 11; BUN 21; Creatinine 0.88; Hemoglobin 12.7; Platelet Count 194; Potassium 4.1; Sodium 133   Recent Lipid Panel Lab Results  Component Value Date/Time   CHOL 128 03/10/2015 10:08 AM   TRIG 95.0 03/10/2015 10:08 AM   HDL 39.30 03/10/2015 10:08 AM   CHOLHDL 3 03/10/2015 10:08 AM   LDLCALC 70 03/10/2015 10:08 AM    Wt Readings from Last 3 Encounters:  03/08/20 68.1 kg  02/09/20 66.2 kg  02/07/20 67 kg     Objective:    Vital Signs:  BP 118/64   Pulse 60   Ht 5' 6"  (1.676 m)   Wt 68.1 kg   SpO2 95%   BMI 24.24 kg/m    Affect appropriate Healthy:  appears stated age 55: normal Neck supple with no adenopathy JVP normal no bruits no thyromegaly Lungs clear with no wheezing and good diaphragmatic motion Heart:  S1/S2 no murmur, no rub, gallop or click PMI normal Abdomen: benighn, BS positve, no tenderness, no AAA no bruit.  No HSM or HJR Distal pulses intact with no bruits No edema Neuro non-focal Skin warm and dry No muscular weakness    ASSESSMENT & PLAN:    1. CAD s/p overlapping DESx2 to LAD on 06/17/2014 Previous stenting circumflex 2006 with spiral dissection : no angina, Continue plavix Since not taking ASA P2Y has been good in past   2. COPD: stable no active wheezing   3. PAF:  s/p TEE DCCV in May 2012, previously on pradaxa but has needed DAT for his CAD in past and no recurrence so not on anticoagulation at this time  4. Hyperlipidemia: continue pravachol labs with primary   5. OSA:  not able to use CPAP at night discussed going to dentist for oral device   6. Rheumatoid arthritis continue pain meds Inactivity has not helped    7. Melanoma:  F/u oncology post chemo being observed at this point   8. Hemochromatosis :  Stable with  history of phlebotomy most recent 02/18/20  to keep ferritin less than 100 and FESO4 Rx Most recent value 88 07/19/19   COVID-19 Education: The signs and symptoms of COVID-19 were discussed with the patient and how to seek care for testing (follow up with PCP or arrange E-visit).  The importance of social distancing was discussed today.   Medication Adjustments/Labs and Tests Ordered: Current medicines are reviewed at length with the patient today.  Concerns regarding medicines are outlined above.   Tests Ordered:  None   Medication Changes:  None   Disposition:  Follow up in a year   Signed, Jenkins Rouge, MD  03/08/2020 11:00 AM    [place]

## 2020-02-28 DIAGNOSIS — M25572 Pain in left ankle and joints of left foot: Secondary | ICD-10-CM | POA: Diagnosis not present

## 2020-02-28 DIAGNOSIS — Z1159 Encounter for screening for other viral diseases: Secondary | ICD-10-CM | POA: Diagnosis not present

## 2020-02-28 DIAGNOSIS — M5459 Other low back pain: Secondary | ICD-10-CM | POA: Diagnosis not present

## 2020-02-28 DIAGNOSIS — M6281 Muscle weakness (generalized): Secondary | ICD-10-CM | POA: Diagnosis not present

## 2020-02-28 DIAGNOSIS — Z20828 Contact with and (suspected) exposure to other viral communicable diseases: Secondary | ICD-10-CM | POA: Diagnosis not present

## 2020-03-06 DIAGNOSIS — Z20828 Contact with and (suspected) exposure to other viral communicable diseases: Secondary | ICD-10-CM | POA: Diagnosis not present

## 2020-03-06 DIAGNOSIS — M5459 Other low back pain: Secondary | ICD-10-CM | POA: Diagnosis not present

## 2020-03-06 DIAGNOSIS — Z1159 Encounter for screening for other viral diseases: Secondary | ICD-10-CM | POA: Diagnosis not present

## 2020-03-06 DIAGNOSIS — M6281 Muscle weakness (generalized): Secondary | ICD-10-CM | POA: Diagnosis not present

## 2020-03-06 DIAGNOSIS — M25572 Pain in left ankle and joints of left foot: Secondary | ICD-10-CM | POA: Diagnosis not present

## 2020-03-08 ENCOUNTER — Encounter: Payer: Self-pay | Admitting: Cardiovascular Disease

## 2020-03-08 ENCOUNTER — Other Ambulatory Visit: Payer: Self-pay

## 2020-03-08 ENCOUNTER — Ambulatory Visit (INDEPENDENT_AMBULATORY_CARE_PROVIDER_SITE_OTHER): Payer: Medicare Other | Admitting: Cardiovascular Disease

## 2020-03-08 VITALS — BP 118/64 | HR 60 | Ht 66.0 in | Wt 150.2 lb

## 2020-03-08 DIAGNOSIS — M25572 Pain in left ankle and joints of left foot: Secondary | ICD-10-CM | POA: Diagnosis not present

## 2020-03-08 DIAGNOSIS — R262 Difficulty in walking, not elsewhere classified: Secondary | ICD-10-CM | POA: Diagnosis not present

## 2020-03-08 DIAGNOSIS — M5459 Other low back pain: Secondary | ICD-10-CM | POA: Diagnosis not present

## 2020-03-08 DIAGNOSIS — I2510 Atherosclerotic heart disease of native coronary artery without angina pectoris: Secondary | ICD-10-CM | POA: Diagnosis not present

## 2020-03-08 DIAGNOSIS — M6281 Muscle weakness (generalized): Secondary | ICD-10-CM | POA: Diagnosis not present

## 2020-03-08 NOTE — Patient Instructions (Addendum)
Medication Instructions:  *If you need a refill on your cardiac medications before your next appointment, please call your pharmacy*  Lab Work: If you have labs (blood work) drawn today and your tests are completely normal, you will receive your results only by:  [place] (if you have MyChart) OR  A paper copy in the mail If you have any lab test that is abnormal or we need to change your treatment, we will call you to review the results.  Testing/Procedures: None ordered today.   Follow-Up: At [facility], you and your health needs are our priority.  As part of our continuing mission to provide you with exceptional heart care, we have created designated Provider Care Teams.  These Care Teams include your primary Cardiologist (physician) and Advanced Practice Providers (APPs -  Physician Assistants and Nurse Practitioners) who all work together to provide you with the care you need, when you need it.  We recommend signing up for the patient portal called "MyChart".  Sign up information is provided on this After Visit Summary.  MyChart is used to connect with patients for Virtual Visits (Telemedicine).  Patients are able to view lab/test results, encounter notes, upcoming appointments, etc.  Non-urgent messages can be sent to your provider as well.   To learn more about what you can do with MyChart, go to [email or web address].    Your next appointment:   6 month(s)  The format for your next appointment:   In Person  Provider:   You may see Jenkins Rouge, MD or one of the following Advanced Practice Providers on your designated Care Team:    Truitt Merle, NP  Cecilie Kicks, NP  Kathyrn Drown, NP

## 2020-03-10 DIAGNOSIS — M6281 Muscle weakness (generalized): Secondary | ICD-10-CM | POA: Diagnosis not present

## 2020-03-10 DIAGNOSIS — R262 Difficulty in walking, not elsewhere classified: Secondary | ICD-10-CM | POA: Diagnosis not present

## 2020-03-10 DIAGNOSIS — M5459 Other low back pain: Secondary | ICD-10-CM | POA: Diagnosis not present

## 2020-03-10 DIAGNOSIS — M25572 Pain in left ankle and joints of left foot: Secondary | ICD-10-CM | POA: Diagnosis not present

## 2020-03-13 DIAGNOSIS — Z20828 Contact with and (suspected) exposure to other viral communicable diseases: Secondary | ICD-10-CM | POA: Diagnosis not present

## 2020-03-13 DIAGNOSIS — M6281 Muscle weakness (generalized): Secondary | ICD-10-CM | POA: Diagnosis not present

## 2020-03-13 DIAGNOSIS — M25572 Pain in left ankle and joints of left foot: Secondary | ICD-10-CM | POA: Diagnosis not present

## 2020-03-13 DIAGNOSIS — M5459 Other low back pain: Secondary | ICD-10-CM | POA: Diagnosis not present

## 2020-03-13 DIAGNOSIS — R262 Difficulty in walking, not elsewhere classified: Secondary | ICD-10-CM | POA: Diagnosis not present

## 2020-03-13 DIAGNOSIS — Z1159 Encounter for screening for other viral diseases: Secondary | ICD-10-CM | POA: Diagnosis not present

## 2020-03-14 ENCOUNTER — Other Ambulatory Visit: Payer: Self-pay | Admitting: Internal Medicine

## 2020-03-15 DIAGNOSIS — M25572 Pain in left ankle and joints of left foot: Secondary | ICD-10-CM | POA: Diagnosis not present

## 2020-03-15 DIAGNOSIS — R262 Difficulty in walking, not elsewhere classified: Secondary | ICD-10-CM | POA: Diagnosis not present

## 2020-03-15 DIAGNOSIS — M5459 Other low back pain: Secondary | ICD-10-CM | POA: Diagnosis not present

## 2020-03-15 DIAGNOSIS — M6281 Muscle weakness (generalized): Secondary | ICD-10-CM | POA: Diagnosis not present

## 2020-03-17 DIAGNOSIS — R262 Difficulty in walking, not elsewhere classified: Secondary | ICD-10-CM | POA: Diagnosis not present

## 2020-03-17 DIAGNOSIS — M6281 Muscle weakness (generalized): Secondary | ICD-10-CM | POA: Diagnosis not present

## 2020-03-17 DIAGNOSIS — M25572 Pain in left ankle and joints of left foot: Secondary | ICD-10-CM | POA: Diagnosis not present

## 2020-03-17 DIAGNOSIS — M5459 Other low back pain: Secondary | ICD-10-CM | POA: Diagnosis not present

## 2020-03-20 ENCOUNTER — Other Ambulatory Visit: Payer: Self-pay

## 2020-03-20 DIAGNOSIS — M5459 Other low back pain: Secondary | ICD-10-CM | POA: Diagnosis not present

## 2020-03-20 DIAGNOSIS — Z1159 Encounter for screening for other viral diseases: Secondary | ICD-10-CM | POA: Diagnosis not present

## 2020-03-20 DIAGNOSIS — R262 Difficulty in walking, not elsewhere classified: Secondary | ICD-10-CM | POA: Diagnosis not present

## 2020-03-20 DIAGNOSIS — M6281 Muscle weakness (generalized): Secondary | ICD-10-CM | POA: Diagnosis not present

## 2020-03-20 DIAGNOSIS — Z20828 Contact with and (suspected) exposure to other viral communicable diseases: Secondary | ICD-10-CM | POA: Diagnosis not present

## 2020-03-20 DIAGNOSIS — M25572 Pain in left ankle and joints of left foot: Secondary | ICD-10-CM | POA: Diagnosis not present

## 2020-03-21 ENCOUNTER — Ambulatory Visit (INDEPENDENT_AMBULATORY_CARE_PROVIDER_SITE_OTHER): Payer: Medicare Other | Admitting: Internal Medicine

## 2020-03-21 ENCOUNTER — Encounter: Payer: Self-pay | Admitting: Internal Medicine

## 2020-03-21 DIAGNOSIS — I10 Essential (primary) hypertension: Secondary | ICD-10-CM

## 2020-03-21 DIAGNOSIS — R252 Cramp and spasm: Secondary | ICD-10-CM

## 2020-03-21 DIAGNOSIS — G894 Chronic pain syndrome: Secondary | ICD-10-CM

## 2020-03-21 DIAGNOSIS — I2510 Atherosclerotic heart disease of native coronary artery without angina pectoris: Secondary | ICD-10-CM

## 2020-03-21 DIAGNOSIS — R0609 Other forms of dyspnea: Secondary | ICD-10-CM

## 2020-03-21 DIAGNOSIS — R0600 Dyspnea, unspecified: Secondary | ICD-10-CM | POA: Diagnosis not present

## 2020-03-21 MED ORDER — LOSARTAN POTASSIUM 50 MG PO TABS: 50.0000 mg | ORAL_TABLET | Freq: Every day | ORAL | 3 refills | Status: DC

## 2020-03-21 MED ORDER — HYDROCHLOROTHIAZIDE 12.5 MG PO CAPS: 12.5000 mg | ORAL_CAPSULE | Freq: Every day | ORAL | 3 refills | Status: DC

## 2020-03-21 MED ORDER — HYDROMORPHONE HCL 2 MG PO TABS
2.0000 mg | ORAL_TABLET | Freq: Three times a day (TID) | ORAL | 0 refills | Status: DC | PRN
Start: 2020-03-21 — End: 2020-05-04

## 2020-03-21 NOTE — Assessment & Plan Note (Signed)
Pravachol - off

## 2020-03-21 NOTE — Assessment & Plan Note (Signed)
Cont Losartan Hold HCTZ due to cramps

## 2020-03-21 NOTE — Assessment & Plan Note (Addendum)
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 Long term use of extended release pain medications. Dilaudid prn  Potential benefits of a long term opioids use as well as potential risks (i.e. addiction risk, apnea etc) and complications (i.e. Somnolence, constipation and others) were explained to the patient and were aknowledged. Lions mane

## 2020-03-21 NOTE — Patient Instructions (Signed)
   B-complex with Niacin 100 mg    Lion's mane

## 2020-03-21 NOTE — Assessment & Plan Note (Signed)
In PT 

## 2020-03-21 NOTE — Progress Notes (Signed)
AP L1 AP   Subjective:  Patient ID: [name], male    DOB: 02-23-1946  Age: 74 y.o. MRN: [id number]  CC: Follow-up (6 week f/u)   HPI DUFF POZZI presents RA, anxiety, chronic pain In PT now    Outpatient Medications Prior to Visit  Medication Sig Dispense Refill  . albuterol (PROVENTIL HFA;VENTOLIN HFA) 108 (90 Base) MCG/ACT inhaler Inhale 1-2 puffs into the lungs every 4 (four) hours as needed for wheezing or shortness of breath. 1 Inhaler 1  . ALPRAZolam (XANAX) 0.5 MG tablet Take 1 tablet (0.5 mg total) by mouth 3 (three) times daily as needed. for anxiety 60 tablet 3  . Cholecalciferol (VITAMIN D3) 2000 UNITS capsule Take 1 capsule (2,000 Units total) by mouth daily. 100 capsule 3  . clopidogrel (PLAVIX) 75 MG tablet TAKE 1 TABLET BY MOUTH EVERY DAY WITH BREAKFAST 90 tablet 3  . FLUoxetine (PROZAC) 40 MG capsule Take 40 mg by mouth daily.    . fluticasone (FLONASE) 50 MCG/ACT nasal spray INSTILL 2 SPRAYS IN EACH NOSTRIL EVERY DAY 16 g 11  . gabapentin (NEURONTIN) 300 MG capsule TAKE 1 CAPSULE(300 MG) BY MOUTH TWICE DAILY 180 capsule 1  . HYDROmorphone (DILAUDID) 2 MG tablet Take 1-1.5 tablets (2-3 mg total) by mouth every 8 (eight) hours as needed for severe pain. 90 tablet 0  . lidocaine-prilocaine (EMLA) cream Apply 1 application topically as needed. 30 g 5  . losartan-hydrochlorothiazide (HYZAAR) 50-12.5 MG tablet TAKE 1 TABLET BY MOUTH DAILY 90 tablet 3  . Melatonin 5 MG TABS Take 5 mg by mouth at bedtime. Takes 10 mg at HS    . metoprolol tartrate (LOPRESSOR) 25 MG tablet TAKE 1 TABLET (25 MG TOTAL) BY MOUTH 2 (TWO) TIMES DAILY. 180 tablet 3  . Omega-3 Fatty Acids (FISH OIL OMEGA-3) 1000 MG CAPS Take 1,000 mg by mouth daily.     . ondansetron (ZOFRAN) 8 MG tablet Take 1 tablet (8 mg total) by mouth 3 (three) times daily. 90 tablet 3  . pantoprazole (PROTONIX) 40 MG tablet TAKE 1 TABLET (40 MG TOTAL) BY MOUTH EVERY EVENING. (Patient taking differently: Take 40  mg by mouth every evening. Pt takes 1/2 a tablet daily) 90 tablet 3  . pravastatin (PRAVACHOL) 20 MG tablet Take 1 tablet (20 mg total) by mouth daily. 90 tablet 1  . predniSONE (DELTASONE) 10 MG tablet 20 mg po qam pc or as directed 60 tablet 1  . predniSONE (DELTASONE) 5 MG tablet TAKE 2 TABLETS (10 MG TOTAL) BY MOUTH DAILY WITH BREAKFAST. 180 tablet 1  . prochlorperazine (COMPAZINE) 10 MG tablet TAKE 1 TABLET(10 MG) BY MOUTH EVERY 6 HOURS AS NEEDED FOR NAUSEA OR VOMITING 385 tablet 1  . Pyridoxine HCl (B-6 PO) Take 1 tablet by mouth.     . tadalafil (CIALIS) 5 MG tablet Take 5 mg by mouth as needed.     . torsemide (DEMADEX) 10 MG tablet Take 1-2 tablets (10-20 mg total) by mouth daily. 60 tablet 3  . traZODone (DESYREL) 100 MG tablet TAKE 1 TABLET AT BEDTIME 90 tablet 3  . vitamin B-12 (CYANOCOBALAMIN) 1000 MCG tablet Take 1,000 mcg by mouth daily.      No facility-administered medications prior to visit.    ROS: Review of Systems  Constitutional: Positive for fatigue. Negative for appetite change and unexpected weight change.  HENT: Negative for congestion, nosebleeds, sneezing, sore throat and trouble swallowing.   Eyes: Negative for itching and visual disturbance.  Respiratory: Negative for cough.   Cardiovascular: Negative for chest pain, palpitations and leg swelling.  Gastrointestinal: Negative for abdominal distention, blood in stool, diarrhea and nausea.  Genitourinary: Negative for frequency and hematuria.  Musculoskeletal: Positive for arthralgias, back pain and gait problem. Negative for joint swelling and neck pain.  Skin: Negative for rash.  Neurological: Negative for dizziness, tremors, speech difficulty and weakness.  Psychiatric/Behavioral: Negative for agitation, dysphoric mood and sleep disturbance. The patient is not nervous/anxious.     Objective:  BP 130/82 (BP Location: Left Arm)   Pulse 60   Temp 98.5 F (36.9 C) (Oral)   Wt 150 lb 3.2 oz (68.1 kg)   SpO2  93%   BMI 24.24 kg/m   BP Readings from Last 3 Encounters:  03/21/20 130/82  03/08/20 118/64  02/18/20 (!) 102/58    Wt Readings from Last 3 Encounters:  03/21/20 150 lb 3.2 oz (68.1 kg)  03/08/20 150 lb 3.2 oz (68.1 kg)  02/09/20 146 lb (66.2 kg)    Physical Exam Constitutional:      General: He is not in acute distress.    Appearance: He is well-developed.     Comments: NAD  HENT:     Mouth/Throat:     Mouth: Oropharynx is clear and moist.  Eyes:     Conjunctiva/sclera: Conjunctivae normal.     Pupils: Pupils are equal, round, and reactive to light.  Neck:     Thyroid: No thyromegaly.     Vascular: No JVD.  Cardiovascular:     Rate and Rhythm: Normal rate and regular rhythm.     Pulses: Intact distal pulses.     Heart sounds: Normal heart sounds. No murmur heard. No friction rub. No gallop.   Pulmonary:     Effort: Pulmonary effort is normal. No respiratory distress.     Breath sounds: Normal breath sounds. No wheezing or rales.  Chest:     Chest wall: No tenderness.  Abdominal:     General: Bowel sounds are normal. There is no distension.     Palpations: Abdomen is soft. There is no mass.     Tenderness: There is no abdominal tenderness. There is no guarding or rebound.  Musculoskeletal:        General: No tenderness or edema. Normal range of motion.     Cervical back: Normal range of motion.  Lymphadenopathy:     Cervical: No cervical adenopathy.  Skin:    General: Skin is warm and dry.     Findings: No rash.  Neurological:     Mental Status: He is alert and oriented to person, place, and time.     Cranial Nerves: No cranial nerve deficit.     Motor: No abnormal muscle tone.     Coordination: He displays a negative Romberg sign. Coordination abnormal.     Gait: Gait abnormal.     Deep Tendon Reflexes: Reflexes are normal and symmetric.  Psychiatric:        Mood and Affect: Mood and affect normal.        Behavior: Behavior normal.        Thought  Content: Thought content normal.        Judgment: Judgment normal.     Lab Results  Component Value Date   WBC 7.7 02/09/2020   HGB 12.7 (L) 02/09/2020   HCT 38.2 (L) 02/09/2020   PLT 194 02/09/2020   GLUCOSE 97 02/09/2020   CHOL 128 03/10/2015   TRIG 95.0 03/10/2015  HDL 39.30 03/10/2015   LDLCALC 70 03/10/2015   ALT 11 02/09/2020   AST 12 (L) 02/09/2020   NA 133 (L) 02/09/2020   K 4.1 02/09/2020   CL 94 (L) 02/09/2020   CREATININE 0.88 02/09/2020   BUN 21 02/09/2020   CO2 33 (H) 02/09/2020   TSH 0.680 09/18/2017   PSA 1.95 10/15/2010   INR 1.1 (H) 06/14/2014   HGBA1C 6.0 09/28/2015    CT THORACIC SPINE W CONTRAST  Result Date: 10/14/2019 CLINICAL DATA:  Spondylosis without myelopathy. Chronic back pain and bilateral leg symptoms. FLUOROSCOPY TIME:  1 minutes 56 seconds. 508.29 micro gray meter squared PROCEDURE: LUMBAR PUNCTURE FOR THORACIC AND LUMBAR MYELOGRAM After thorough discussion of risks and benefits of the procedure including bleeding, infection, injury to nerves, blood vessels, adjacent structures as well as headache and CSF leak, written and oral informed consent was obtained. Consent was obtained by Dr. Nelson Chimes. Patient was positioned prone on the fluoroscopy table. Local anesthesia was provided with 1% lidocaine without epinephrine after prepped and draped in the usual sterile fashion. Puncture was performed at L5 using a 3 1/2 inch 22-gauge spinal needle via right paramedian approach. Using a single pass through the dura, the needle was placed within the thecal sac, with return of clear CSF. 10 mL of Isovue M-300 was injected into the thecal sac, with normal opacification of the nerve roots and cauda equina consistent with free flow within the subarachnoid space. The patient was then moved to the trendelenburg position and contrast flowed into the Thoracic spine region. I personally performed the lumbar puncture and administered the intrathecal contrast. I also  personally performed acquisition of the myelogram images. TECHNIQUE: Contiguous axial images were obtained through the Thoracic and Lumbar spine after the intrathecal infusion of infusion. Coronal and sagittal reconstructions were obtained of the axial image sets. FINDINGS: THORACIC AND LUMBAR MYELOGRAM FINDINGS: Lumbar region: Previous decompression and fusion procedure from L1-S1. Chronic screw fractures at S1. Wide patency of the spinal canal throughout that region. Flexion extension views do not show any visible lumbar motion. Thoracic region: Thoracic curvature convex to the right. 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 No apparent compressive central canal stenosis. See results of CT scan. CT THORACIC MYELOGRAM FINDINGS: Thoracic curvature convex to the right with the apex at about T8. Chronic degenerative anterolisthesis at C7-T1, with apparent facet joint effusion. No significant disc pathology at T1-2 or T2-3. From T3-4 through T12-L1, the thoracic discs show degeneration with loss of disc height and areas of vacuum phenomenon. Small endplate osteophytes. No compressive stenosis of the thoracic spinal canal. Foramina are sufficiently patent. Ordinary mild thoracic facet osteoarthritis without pronounced hypertrophic change. CT LUMBAR MYELOGRAM FINDINGS: T12-L1: Mild bulging of the disc. No canal or foraminal stenosis. Conus tip is at L1. L1-2: Previous fusion procedure with pedicle screws and posterior rods. Screw on the left at L1 appears to breach the medial cortex of the pedicle on a chronic basis but does not cause visible neural compression. L2-3: Previous fusion procedure with pedicle screws and posterior rods. Wide patency of the canal and foramina. Right-sided L3 screw breeches the medial cortex of the pedicle without distinct neural compression. L3-4: Previous fusion procedure with pedicle screws and posterior rods. Wide patency of the  canal. Right-sided pedicle screw at L4 breaches the medial pedicle and traverses the right lateral recess. Loss of the bracket attachment of the right screw to the posterior rod. L4-5: Previous fusion procedure  with pedicle screws and posterior rods. Wide patency of the canal and foramina. 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 L5-S1: Previous fusion procedure with pedicle screws and posterior rods. Chronic S1 screw fractures. There may not be solid union at this level, with potential for ongoing motion. No compressive canal stenosis. Mild bilateral foraminal narrowing, not grossly compressive. 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 Contrast is excluded from the tip of the thecal sac. IMPRESSION: Lumbar region: Previous posterior decompression and fusion procedure from L1 to the sacrum. Solid union from L1 through L5. Possible nonunion at L5-S1. Sufficient patency of the canal and foramina. Some of the pedicle screws do breach the medial pedicle cortices as outlined above. Chronic S1 screw fractures. Right screw at L4 is no longer fully attached by the bracket to the posterior rod. 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 Distal thecal sac is excluded from the contrast by pronounced arachnoiditis. 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 Mild thoracic spinal curvature. No compressive stenosis of the spinal canal or likely significant foraminal stenosis. Some facet osteoarthritis could contribute to back pain. Electronically Signed   By: Nelson Chimes M.D.   On: 10/14/2019 11:04   CT LUMBAR SPINE W CONTRAST  Result Date: 10/14/2019 CLINICAL DATA:  Spondylosis without myelopathy. Chronic back pain and bilateral leg symptoms. FLUOROSCOPY TIME:  1 minutes 56 seconds. 508.29 micro gray meter squared PROCEDURE: LUMBAR PUNCTURE FOR THORACIC AND LUMBAR MYELOGRAM After thorough discussion  of risks and benefits of the procedure including bleeding, infection, injury to nerves, blood vessels, adjacent structures as well as headache and CSF leak, written and oral informed consent was obtained. Consent was obtained by Dr. Nelson Chimes. Patient was positioned prone on the fluoroscopy table. Local anesthesia was provided with 1% lidocaine without epinephrine after prepped and draped in the usual sterile fashion. Puncture was performed at L5 using a 3 1/2 inch 22-gauge spinal needle via right paramedian approach. Using a single pass through the dura, the needle was placed within the thecal sac, with return of clear CSF. 10 mL of Isovue M-300 was injected into the thecal sac, with normal opacification of the nerve roots and cauda equina consistent with free flow within the subarachnoid space. The patient was then moved to the trendelenburg position and contrast flowed into the Thoracic spine region. I personally performed the lumbar puncture and administered the intrathecal contrast. I also personally performed acquisition of the myelogram images. TECHNIQUE: Contiguous axial images were obtained through the Thoracic and Lumbar spine after the intrathecal infusion of infusion. Coronal and sagittal reconstructions were obtained of the axial image sets. FINDINGS: THORACIC AND LUMBAR MYELOGRAM FINDINGS: Lumbar region: Previous decompression and fusion procedure from L1-S1. Chronic screw fractures at S1. Wide patency of the spinal canal throughout that region. Flexion extension views do not show any visible lumbar motion. Thoracic region: Thoracic curvature convex to the right. 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 No apparent compressive central canal stenosis. See results of CT scan. CT THORACIC MYELOGRAM FINDINGS: Thoracic curvature convex to the right with the apex at about T8. Chronic degenerative anterolisthesis at C7-T1, with apparent facet joint  effusion. No significant disc pathology at T1-2 or T2-3. From T3-4 through T12-L1, the thoracic discs show degeneration with loss of disc height and areas of vacuum phenomenon. Small endplate osteophytes. No compressive stenosis of the thoracic spinal canal. Foramina are sufficiently patent. Ordinary mild  thoracic facet osteoarthritis without pronounced hypertrophic change. CT LUMBAR MYELOGRAM FINDINGS: T12-L1: Mild bulging of the disc. No canal or foraminal stenosis. Conus tip is at L1. L1-2: Previous fusion procedure with pedicle screws and posterior rods. Screw on the left at L1 appears to breach the medial cortex of the pedicle on a chronic basis but does not cause visible neural compression. L2-3: Previous fusion procedure with pedicle screws and posterior rods. Wide patency of the canal and foramina. Right-sided L3 screw breeches the medial cortex of the pedicle without distinct neural compression. L3-4: Previous fusion procedure with pedicle screws and posterior rods. Wide patency of the canal. Right-sided pedicle screw at L4 breaches the medial pedicle and traverses the right lateral recess. Loss of the bracket attachment of the right screw to the posterior rod. L4-5: Previous fusion procedure with pedicle screws and posterior rods. Wide patency of the canal and foramina. 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 L5-S1: Previous fusion procedure with pedicle screws and posterior rods. Chronic S1 screw fractures. There may not be solid union at this level, with potential for ongoing motion. No compressive canal stenosis. Mild bilateral foraminal narrowing, not grossly compressive. 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 Contrast is excluded from the tip of the thecal sac. IMPRESSION: Lumbar region: Previous posterior decompression and fusion procedure from L1 to the sacrum. Solid union from L1 through L5. Possible nonunion at L5-S1. Sufficient patency of the canal and  foramina. Some of the pedicle screws do breach the medial pedicle cortices as outlined above. Chronic S1 screw fractures. Right screw at L4 is no longer fully attached by the bracket to the posterior rod. 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 Distal thecal sac is excluded from the contrast by pronounced arachnoiditis. 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 Mild thoracic spinal curvature. No compressive stenosis of the spinal canal or likely significant foraminal stenosis. Some facet osteoarthritis could contribute to back pain. Electronically Signed   By: Nelson Chimes M.D.   On: 10/14/2019 11:04   DG MYELOGRAPHY LUMBAR INJ MULTI REGION  Result Date: 10/14/2019 CLINICAL DATA:  Spondylosis without myelopathy. Chronic back pain and bilateral leg symptoms. FLUOROSCOPY TIME:  1 minutes 56 seconds. 508.29 micro gray meter squared PROCEDURE: LUMBAR PUNCTURE FOR THORACIC AND LUMBAR MYELOGRAM After thorough discussion of risks and benefits of the procedure including bleeding, infection, injury to nerves, blood vessels, adjacent structures as well as headache and CSF leak, written and oral informed consent was obtained. Consent was obtained by Dr. Nelson Chimes. Patient was positioned prone on the fluoroscopy table. Local anesthesia was provided with 1% lidocaine without epinephrine after prepped and draped in the usual sterile fashion. Puncture was performed at L5 using a 3 1/2 inch 22-gauge spinal needle via right paramedian approach. Using a single pass through the dura, the needle was placed within the thecal sac, with return of clear CSF. 10 mL of Isovue M-300 was injected into the thecal sac, with normal opacification of the nerve roots and cauda equina consistent with free flow within the subarachnoid space. The patient was then moved to the trendelenburg position and contrast flowed into the Thoracic spine region. I  personally performed the lumbar puncture and administered the intrathecal contrast. I also personally performed acquisition of the myelogram images. TECHNIQUE: Contiguous axial images were obtained through the Thoracic and Lumbar spine after the intrathecal infusion of infusion. Coronal and sagittal reconstructions were obtained of the axial  image sets. FINDINGS: THORACIC AND LUMBAR MYELOGRAM FINDINGS: Lumbar region: Previous decompression and fusion procedure from L1-S1. Chronic screw fractures at S1. Wide patency of the spinal canal throughout that region. Flexion extension views do not show any visible lumbar motion. Thoracic region: Thoracic curvature convex to the right. 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 No apparent compressive central canal stenosis. See results of CT scan. CT THORACIC MYELOGRAM FINDINGS: Thoracic curvature convex to the right with the apex at about T8. Chronic degenerative anterolisthesis at C7-T1, with apparent facet joint effusion. No significant disc pathology at T1-2 or T2-3. From T3-4 through T12-L1, the thoracic discs show degeneration with loss of disc height and areas of vacuum phenomenon. Small endplate osteophytes. No compressive stenosis of the thoracic spinal canal. Foramina are sufficiently patent. Ordinary mild thoracic facet osteoarthritis without pronounced hypertrophic change. CT LUMBAR MYELOGRAM FINDINGS: T12-L1: Mild bulging of the disc. No canal or foraminal stenosis. Conus tip is at L1. L1-2: Previous fusion procedure with pedicle screws and posterior rods. Screw on the left at L1 appears to breach the medial cortex of the pedicle on a chronic basis but does not cause visible neural compression. L2-3: Previous fusion procedure with pedicle screws and posterior rods. Wide patency of the canal and foramina. Right-sided L3 screw breeches the medial cortex of the pedicle without distinct neural compression.  L3-4: Previous fusion procedure with pedicle screws and posterior rods. Wide patency of the canal. Right-sided pedicle screw at L4 breaches the medial pedicle and traverses the right lateral recess. Loss of the bracket attachment of the right screw to the posterior rod. L4-5: Previous fusion procedure with pedicle screws and posterior rods. Wide patency of the canal and foramina. 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 L5-S1: Previous fusion procedure with pedicle screws and posterior rods. Chronic S1 screw fractures. There may not be solid union at this level, with potential for ongoing motion. No compressive canal stenosis. Mild bilateral foraminal narrowing, not grossly compressive. 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 Contrast is excluded from the tip of the thecal sac. IMPRESSION: Lumbar region: Previous posterior decompression and fusion procedure from L1 to the sacrum. Solid union from L1 through L5. Possible nonunion at L5-S1. Sufficient patency of the canal and foramina. Some of the pedicle screws do breach the medial pedicle cortices as outlined above. Chronic S1 screw fractures. Right screw at L4 is no longer fully attached by the bracket to the posterior rod. 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 Distal thecal sac is excluded from the contrast by pronounced arachnoiditis. 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 Mild thoracic spinal curvature. No compressive stenosis of the spinal canal or likely significant foraminal stenosis. Some facet osteoarthritis could contribute to back pain. Electronically Signed   By: Nelson Chimes M.D.   On: 10/14/2019 11:04    Assessment & Plan:

## 2020-03-22 ENCOUNTER — Inpatient Hospital Stay: Payer: Medicare Other

## 2020-03-22 ENCOUNTER — Other Ambulatory Visit: Payer: Self-pay

## 2020-03-22 ENCOUNTER — Inpatient Hospital Stay: Payer: Medicare Other | Attending: Hematology & Oncology

## 2020-03-22 VITALS — BP 145/95 | HR 60 | Temp 97.5°F | Resp 18

## 2020-03-22 DIAGNOSIS — M5459 Other low back pain: Secondary | ICD-10-CM | POA: Diagnosis not present

## 2020-03-22 DIAGNOSIS — K518 Other ulcerative 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: Secondary | ICD-10-CM

## 2020-03-22 DIAGNOSIS — C4372 Malignant melanoma of left lower limb, including hip: Secondary | ICD-10-CM

## 2020-03-22 DIAGNOSIS — R262 Difficulty in walking, not elsewhere classified: Secondary | ICD-10-CM | POA: Diagnosis not present

## 2020-03-22 DIAGNOSIS — Z85820 Personal history of malignant melanoma of skin: Secondary | ICD-10-CM | POA: Insufficient documentation

## 2020-03-22 DIAGNOSIS — D500 Iron deficiency anemia secondary to blood loss (chronic): Secondary | ICD-10-CM

## 2020-03-22 DIAGNOSIS — D509 Iron deficiency anemia, unspecified: Secondary | ICD-10-CM | POA: Diagnosis not present

## 2020-03-22 DIAGNOSIS — M25572 Pain in left ankle and joints of left foot: Secondary | ICD-10-CM | POA: Diagnosis not present

## 2020-03-22 DIAGNOSIS — M6281 Muscle weakness (generalized): Secondary | ICD-10-CM | POA: Diagnosis not present

## 2020-03-22 DIAGNOSIS — R197 Diarrhea, unspecified: Secondary | ICD-10-CM

## 2020-03-22 DIAGNOSIS — Z95828 Presence of other vascular implants and grafts: Secondary | ICD-10-CM

## 2020-03-22 LAB — CBC WITH DIFFERENTIAL (CANCER CENTER ONLY)
Abs Immature Granulocytes: 0.04 10*3/uL (ref 0.00–0.07)
Basophils Absolute: 0 10*3/uL (ref 0.0–0.1)
Basophils Relative: 0 %
Eosinophils Absolute: 0 10*3/uL (ref 0.0–0.5)
Eosinophils Relative: 0 %
HCT: 36.7 % — ABNORMAL LOW (ref 39.0–52.0)
Hemoglobin: 12.4 g/dL — ABNORMAL LOW (ref 13.0–17.0)
Immature Granulocytes: 1 %
Lymphocytes Relative: 12 %
Lymphs Abs: 1 10*3/uL (ref 0.7–4.0)
MCH: 32.4 pg (ref 26.0–34.0)
MCHC: 33.8 g/dL (ref 30.0–36.0)
MCV: 95.8 fL (ref 80.0–100.0)
Monocytes Absolute: 0.5 10*3/uL (ref 0.1–1.0)
Monocytes Relative: 6 %
Neutro Abs: 6.6 10*3/uL (ref 1.7–7.7)
Neutrophils Relative %: 81 %
Platelet Count: 225 10*3/uL (ref 150–400)
RBC: 3.83 MIL/uL — ABNORMAL LOW (ref 4.22–5.81)
RDW: 12.8 % (ref 11.5–15.5)
WBC Count: 8.2 10*3/uL (ref 4.0–10.5)
nRBC: 0 % (ref 0.0–0.2)

## 2020-03-22 LAB — CMP (CANCER CENTER ONLY)
ALT: 9 U/L (ref 0–44)
AST: 12 U/L — ABNORMAL LOW (ref 15–41)
Albumin: 3.8 g/dL (ref 3.5–5.0)
Alkaline Phosphatase: 52 U/L (ref 38–126)
Anion gap: 6 (ref 5–15)
BUN: 16 mg/dL (ref 8–23)
CO2: 31 mmol/L (ref 22–32)
Calcium: 9 mg/dL (ref 8.9–10.3)
Chloride: 89 mmol/L — ABNORMAL LOW (ref 98–111)
Creatinine: 0.9 mg/dL (ref 0.61–1.24)
GFR, Estimated: >60 mL/min (ref >60)
Glucose, Bld: 105 mg/dL — ABNORMAL HIGH (ref 70–99)
Potassium: 4.2 mmol/L (ref 3.5–5.1)
Sodium: 126 mmol/L — ABNORMAL LOW (ref 135–145)
Total Bilirubin: 0.7 mg/dL (ref 0.3–1.2)
Total Protein: 6 g/dL — ABNORMAL LOW (ref 6.5–8.1)

## 2020-03-22 MED ORDER — SODIUM CHLORIDE 0.9% FLUSH
10.0000 mL | INTRAVENOUS | Status: DC | PRN
  Administered 2020-03-22: 11:00:00 10 mL via INTRAVENOUS
  Filled 2020-03-22: qty 10

## 2020-03-22 MED ORDER — SODIUM CHLORIDE 0.9% FLUSH
10.0000 mL | INTRAVENOUS | Status: DC | PRN
  Filled 2020-03-22: qty 10

## 2020-03-22 MED ORDER — HEPARIN SOD (PORK) LOCK FLUSH 100 UNIT/ML IV SOLN
500.0000 [IU] | Freq: Once | INTRAVENOUS | Status: AC | PRN
  Administered 2020-03-22: 11:00:00 500 [IU]
  Filled 2020-03-22: qty 5

## 2020-03-22 MED ORDER — HEPARIN SOD (PORK) LOCK FLUSH 100 UNIT/ML IV SOLN
500.0000 [IU] | Freq: Once | INTRAVENOUS | Status: DC
  Filled 2020-03-22: qty 5

## 2020-03-22 NOTE — Patient Instructions (Signed)
Implanted Port Insertion, Care After This sheet gives you information about how to care for yourself after your procedure. Your health care provider may also give you more specific instructions. If you have problems or questions, contact your health care provider. What can I expect after the procedure? After the procedure, it is common to have:  Discomfort at the port insertion site.  Bruising on the skin over the port. This should improve over 3-4 days. Follow these instructions at home: [facility] care  After your port is placed, you will get a manufacturer's information card. The card has information about your port. Keep this card with you at all times.  Take care of the port as told by your health care provider. Ask your health care provider if you or a family member can get training for taking care of the port at home. A home health care nurse may also take care of the port.  Make sure to remember what type of port you have. Incision care      Follow instructions from your health care provider about how to take care of your port insertion site. Make sure you: ? Wash your hands with soap and water before and after you change your bandage (dressing). If soap and water are not available, use hand sanitizer. ? Change your dressing as told by your health care provider. ? Leave stitches (sutures), skin glue, or adhesive strips in place. These skin closures may need to stay in place for 2 weeks or longer. If adhesive strip edges start to loosen and curl up, you may trim the loose edges. Do not remove adhesive strips completely unless your health care provider tells you to do that.  Check your port insertion site every day for signs of infection. Check for: ? Redness, swelling, or pain. ? Fluid or blood. ? Warmth. ? Pus or a bad smell. Activity  Return to your normal activities as told by your health care provider. Ask your health care provider what activities are safe for you.  Do not  lift anything that is heavier than 10 lb (4.5 kg), or the limit that you are told, until your health care provider says that it is safe. General instructions  Take over-the-counter and prescription medicines only as told by your health care provider.  Do not take baths, swim, or use a hot tub until your health care provider approves. Ask your health care provider if you may take showers. You may only be allowed to take sponge baths.  Do not drive for 24 hours if you were given a sedative during your procedure.  Wear a medical alert bracelet in case of an emergency. This will tell any health care providers that you have a port.  Keep all follow-up visits as told by your health care provider. This is important. Contact a health care provider if:  You cannot flush your port with saline as directed, or you cannot draw blood from the port.  You have a fever or chills.  You have redness, swelling, or pain around your port insertion site.  You have fluid or blood coming from your port insertion site.  Your port insertion site feels warm to the touch.  You have pus or a bad smell coming from the port insertion site. Get help right away if:  You have chest pain or shortness of breath.  You have bleeding from your port that you cannot control. Summary  Take care of the port as told by your health  care provider. Keep the manufacturer's information card with you at all times.  Change your dressing as told by your health care provider.  Contact a health care provider if you have a fever or chills or if you have redness, swelling, or pain around your port insertion site.  Keep all follow-up visits as told by your health care provider. This information is not intended to replace advice given to you by your health care provider. Make sure you discuss any questions you have with your health care provider. Document Revised: 10/21/2017 Document Reviewed: 10/21/2017 Elsevier Patient Education   [place].

## 2020-03-23 LAB — IRON AND TIBC
Iron: 85 ug/dL (ref 42–163)
Saturation Ratios: 38 % (ref 20–55)
TIBC: 223 ug/dL (ref 202–409)
UIBC: 137 ug/dL (ref 117–376)

## 2020-03-23 LAB — FERRITIN: Ferritin: 65 ng/mL (ref 24–336)

## 2020-03-27 DIAGNOSIS — Z20828 Contact with and (suspected) exposure to other viral communicable diseases: Secondary | ICD-10-CM | POA: Diagnosis not present

## 2020-03-27 DIAGNOSIS — M25572 Pain in left ankle and joints of left foot: Secondary | ICD-10-CM | POA: Diagnosis not present

## 2020-03-27 DIAGNOSIS — Z1159 Encounter for screening for other viral diseases: Secondary | ICD-10-CM | POA: Diagnosis not present

## 2020-03-27 DIAGNOSIS — M5459 Other low back pain: Secondary | ICD-10-CM | POA: Diagnosis not present

## 2020-03-27 DIAGNOSIS — M6281 Muscle weakness (generalized): Secondary | ICD-10-CM | POA: Diagnosis not present

## 2020-03-27 DIAGNOSIS — R262 Difficulty in walking, not elsewhere classified: Secondary | ICD-10-CM | POA: Diagnosis not present

## 2020-03-29 DIAGNOSIS — M6281 Muscle weakness (generalized): Secondary | ICD-10-CM | POA: Diagnosis not present

## 2020-03-29 DIAGNOSIS — M5459 Other low back pain: Secondary | ICD-10-CM | POA: Diagnosis not present

## 2020-03-29 DIAGNOSIS — M25572 Pain in left ankle and joints of left foot: Secondary | ICD-10-CM | POA: Diagnosis not present

## 2020-03-29 DIAGNOSIS — R262 Difficulty in walking, not elsewhere classified: Secondary | ICD-10-CM | POA: Diagnosis not present

## 2020-04-03 DIAGNOSIS — M5459 Other low back pain: Secondary | ICD-10-CM | POA: Diagnosis not present

## 2020-04-03 DIAGNOSIS — Z1159 Encounter for screening for other viral diseases: Secondary | ICD-10-CM | POA: Diagnosis not present

## 2020-04-03 DIAGNOSIS — M6281 Muscle weakness (generalized): Secondary | ICD-10-CM | POA: Diagnosis not present

## 2020-04-03 DIAGNOSIS — M25572 Pain in left ankle and joints of left foot: Secondary | ICD-10-CM | POA: Diagnosis not present

## 2020-04-03 DIAGNOSIS — Z20828 Contact with and (suspected) exposure to other viral communicable diseases: Secondary | ICD-10-CM | POA: Diagnosis not present

## 2020-04-03 DIAGNOSIS — R262 Difficulty in walking, not elsewhere classified: Secondary | ICD-10-CM | POA: Diagnosis not present

## 2020-04-05 ENCOUNTER — Other Ambulatory Visit: Payer: Self-pay | Admitting: Internal Medicine

## 2020-04-05 DIAGNOSIS — M5459 Other low back pain: Secondary | ICD-10-CM | POA: Diagnosis not present

## 2020-04-05 DIAGNOSIS — M25572 Pain in left ankle and joints of left foot: Secondary | ICD-10-CM | POA: Diagnosis not present

## 2020-04-05 DIAGNOSIS — R262 Difficulty in walking, not elsewhere classified: Secondary | ICD-10-CM | POA: Diagnosis not present

## 2020-04-05 DIAGNOSIS — M6281 Muscle weakness (generalized): Secondary | ICD-10-CM | POA: Diagnosis not present

## 2020-04-10 DIAGNOSIS — Z20828 Contact with and (suspected) exposure to other viral communicable diseases: Secondary | ICD-10-CM | POA: Diagnosis not present

## 2020-04-10 DIAGNOSIS — M25572 Pain in left ankle and joints of left foot: Secondary | ICD-10-CM | POA: Diagnosis not present

## 2020-04-10 DIAGNOSIS — R262 Difficulty in walking, not elsewhere classified: Secondary | ICD-10-CM | POA: Diagnosis not present

## 2020-04-10 DIAGNOSIS — Z1159 Encounter for screening for other viral diseases: Secondary | ICD-10-CM | POA: Diagnosis not present

## 2020-04-10 DIAGNOSIS — M5459 Other low back pain: Secondary | ICD-10-CM | POA: Diagnosis not present

## 2020-04-10 DIAGNOSIS — M25511 Pain in right shoulder: Secondary | ICD-10-CM | POA: Diagnosis not present

## 2020-04-10 DIAGNOSIS — M79641 Pain in right hand: Secondary | ICD-10-CM | POA: Diagnosis not present

## 2020-04-10 DIAGNOSIS — M79642 Pain in left hand: Secondary | ICD-10-CM | POA: Diagnosis not present

## 2020-04-10 DIAGNOSIS — M6281 Muscle weakness (generalized): Secondary | ICD-10-CM | POA: Diagnosis not present

## 2020-04-13 DIAGNOSIS — M5459 Other low back pain: Secondary | ICD-10-CM | POA: Diagnosis not present

## 2020-04-13 DIAGNOSIS — M79641 Pain in right hand: Secondary | ICD-10-CM | POA: Diagnosis not present

## 2020-04-13 DIAGNOSIS — M25572 Pain in left ankle and joints of left foot: Secondary | ICD-10-CM | POA: Diagnosis not present

## 2020-04-13 DIAGNOSIS — M6281 Muscle weakness (generalized): Secondary | ICD-10-CM | POA: Diagnosis not present

## 2020-04-13 DIAGNOSIS — M25511 Pain in right shoulder: Secondary | ICD-10-CM | POA: Diagnosis not present

## 2020-04-13 DIAGNOSIS — M79642 Pain in left hand: Secondary | ICD-10-CM | POA: Diagnosis not present

## 2020-04-17 DIAGNOSIS — M79641 Pain in right hand: Secondary | ICD-10-CM | POA: Diagnosis not present

## 2020-04-17 DIAGNOSIS — Z1159 Encounter for screening for other viral diseases: Secondary | ICD-10-CM | POA: Diagnosis not present

## 2020-04-17 DIAGNOSIS — M5459 Other low back pain: Secondary | ICD-10-CM | POA: Diagnosis not present

## 2020-04-17 DIAGNOSIS — Z20828 Contact with and (suspected) exposure to other viral communicable diseases: Secondary | ICD-10-CM | POA: Diagnosis not present

## 2020-04-17 DIAGNOSIS — M6281 Muscle weakness (generalized): Secondary | ICD-10-CM | POA: Diagnosis not present

## 2020-04-17 DIAGNOSIS — M79642 Pain in left hand: Secondary | ICD-10-CM | POA: Diagnosis not present

## 2020-04-17 DIAGNOSIS — M25572 Pain in left ankle and joints of left foot: Secondary | ICD-10-CM | POA: Diagnosis not present

## 2020-04-17 DIAGNOSIS — M25511 Pain in right shoulder: Secondary | ICD-10-CM | POA: Diagnosis not present

## 2020-04-19 DIAGNOSIS — M79642 Pain in left hand: Secondary | ICD-10-CM | POA: Diagnosis not present

## 2020-04-19 DIAGNOSIS — M6281 Muscle weakness (generalized): Secondary | ICD-10-CM | POA: Diagnosis not present

## 2020-04-19 DIAGNOSIS — M79641 Pain in right hand: Secondary | ICD-10-CM | POA: Diagnosis not present

## 2020-04-19 DIAGNOSIS — M25572 Pain in left ankle and joints of left foot: Secondary | ICD-10-CM | POA: Diagnosis not present

## 2020-04-19 DIAGNOSIS — M5459 Other low back pain: Secondary | ICD-10-CM | POA: Diagnosis not present

## 2020-04-19 DIAGNOSIS — M25511 Pain in right shoulder: Secondary | ICD-10-CM | POA: Diagnosis not present

## 2020-04-21 DIAGNOSIS — M5459 Other low back pain: Secondary | ICD-10-CM | POA: Diagnosis not present

## 2020-04-21 DIAGNOSIS — M25572 Pain in left ankle and joints of left foot: Secondary | ICD-10-CM | POA: Diagnosis not present

## 2020-04-21 DIAGNOSIS — M79641 Pain in right hand: Secondary | ICD-10-CM | POA: Diagnosis not present

## 2020-04-21 DIAGNOSIS — M25511 Pain in right shoulder: Secondary | ICD-10-CM | POA: Diagnosis not present

## 2020-04-21 DIAGNOSIS — M6281 Muscle weakness (generalized): Secondary | ICD-10-CM | POA: Diagnosis not present

## 2020-04-21 DIAGNOSIS — M79642 Pain in left hand: Secondary | ICD-10-CM | POA: Diagnosis not present

## 2020-04-26 ENCOUNTER — Other Ambulatory Visit: Payer: Self-pay | Admitting: Internal Medicine

## 2020-04-26 DIAGNOSIS — M79642 Pain in left hand: Secondary | ICD-10-CM | POA: Diagnosis not present

## 2020-04-26 DIAGNOSIS — M25511 Pain in right shoulder: Secondary | ICD-10-CM | POA: Diagnosis not present

## 2020-04-26 DIAGNOSIS — M6281 Muscle weakness (generalized): Secondary | ICD-10-CM | POA: Diagnosis not present

## 2020-04-26 DIAGNOSIS — M79641 Pain in right hand: Secondary | ICD-10-CM | POA: Diagnosis not present

## 2020-04-26 DIAGNOSIS — M25572 Pain in left ankle and joints of left foot: Secondary | ICD-10-CM | POA: Diagnosis not present

## 2020-04-26 DIAGNOSIS — M5459 Other low back pain: Secondary | ICD-10-CM | POA: Diagnosis not present

## 2020-04-27 DIAGNOSIS — M25572 Pain in left ankle and joints of left foot: Secondary | ICD-10-CM | POA: Diagnosis not present

## 2020-04-27 DIAGNOSIS — M6281 Muscle weakness (generalized): Secondary | ICD-10-CM | POA: Diagnosis not present

## 2020-04-27 DIAGNOSIS — M25511 Pain in right shoulder: Secondary | ICD-10-CM | POA: Diagnosis not present

## 2020-04-27 DIAGNOSIS — M5459 Other low back pain: Secondary | ICD-10-CM | POA: Diagnosis not present

## 2020-04-27 DIAGNOSIS — M79641 Pain in right hand: Secondary | ICD-10-CM | POA: Diagnosis not present

## 2020-04-27 DIAGNOSIS — M79642 Pain in left hand: Secondary | ICD-10-CM | POA: Diagnosis not present

## 2020-04-28 ENCOUNTER — Telehealth: Payer: Self-pay | Admitting: *Deleted

## 2020-04-28 DIAGNOSIS — S32009K Unspecified fracture of unspecified lumbar vertebra, subsequent encounter for fracture with nonunion: Secondary | ICD-10-CM | POA: Diagnosis not present

## 2020-04-28 NOTE — Telephone Encounter (Signed)
[name]. Cavanagh 75 year old male was last seen in clinic on 03/08/2020.  During that time he reported he was doing well and enjoyed living at [employer]  He reported he was much more active and working out.  He denied angina.  His PMH includes coronary artery disease, paroxysmal atrial fibrillation not on systemic anticoagulation with no recurrence, hyperlipidemia, COPD, GERD, OSA, rheumatoid arthritis, and hemochromatosis.  He had a left circumflex stent with spiral dissection in 2006.  He underwent TEE DCCV 5/12.  He has history of surgical wound infection post lumbar fusion which was positive for Pseudomonas and required 6 weeks of IV antibiotics.  He was admitted on 3/16 with chest tightness and dyspnea and underwent cardiac catheterization which showed no stenosis in proximal-mid LAD treated with overlapping DES.  He undergoes phlebotomy to maintain ferritin less than 100.  He is requesting lumbar fusion.  May his Plavix be held prior to the procedure?  Thank you for your help.  Please direct response to [facility] preop pool.  Jossie Ng. Takeira Yanes NP-C    04/28/2020, 1:42 PM [facility] [address] Office [phone] Fax [phone]

## 2020-04-28 NOTE — Telephone Encounter (Signed)
   Primary Cardiologist: Jenkins Rouge, MD  Chart reviewed as part of pre-operative protocol coverage. Given past medical history and time since last visit, based on ACC/AHA guidelines, [name] would be at acceptable risk for the planned procedure without further cardiovascular testing.   His Plavix may be held for 5-7 days prior to his procedure.  Please resume as soon as hemostasis is achieved.  I will route this recommendation to the requesting party via Epic fax function and remove from pre-op pool.  Please call with questions.   Jossie Ng. Soraiya Ahner NP-C    04/28/2020, 2:06 PM [facility] [address] Office [phone] Fax [phone]

## 2020-04-28 NOTE — Telephone Encounter (Signed)
° °  [facility] Pre-operative Risk Assessment    HEARTCARE STAFF: - Please ensure there is not already an duplicate clearance open for this procedure. - Under Visit Info/Reason for Call, type in Other and utilize the format Clearance MM/DD/YY or Clearance TBD. Do not use dashes or single digits. - If request is for dental extraction, please clarify the # of teeth to be extracted.  Request for surgical clearance:  1. What type of surgery is being performed? LUMBAR FUSION   2. When is this surgery scheduled? TBD   3. What type of clearance is required (medical clearance vs. Pharmacy clearance to hold med vs. Both)? MEDICAL  4. Are there any medications that need to be held prior to surgery and how long? PLAVIX   5. Practice name and name of physician performing surgery? [facility]; DR. JEFFREY JENKINS   6. What is the office phone number? [phone]   7.   What is the office fax number? [facility]: [phone]  8.   Anesthesia type (None, local, MAC, general) ? GENERAL   Julaine Hua 04/28/2020, 1:25 PM  _________________________________________________________________   (provider comments below)

## 2020-04-28 NOTE — Telephone Encounter (Signed)
May hold plavix for fusion

## 2020-05-01 ENCOUNTER — Other Ambulatory Visit: Payer: Self-pay | Admitting: Neurosurgery

## 2020-05-01 DIAGNOSIS — M25511 Pain in right shoulder: Secondary | ICD-10-CM | POA: Diagnosis not present

## 2020-05-01 DIAGNOSIS — M79641 Pain in right hand: Secondary | ICD-10-CM | POA: Diagnosis not present

## 2020-05-01 DIAGNOSIS — Z20828 Contact with and (suspected) exposure to other viral communicable diseases: Secondary | ICD-10-CM | POA: Diagnosis not present

## 2020-05-01 DIAGNOSIS — M5459 Other low back pain: Secondary | ICD-10-CM | POA: Diagnosis not present

## 2020-05-01 DIAGNOSIS — M79642 Pain in left hand: Secondary | ICD-10-CM | POA: Diagnosis not present

## 2020-05-01 DIAGNOSIS — Z1159 Encounter for screening for other viral diseases: Secondary | ICD-10-CM | POA: Diagnosis not present

## 2020-05-01 DIAGNOSIS — M6281 Muscle weakness (generalized): Secondary | ICD-10-CM | POA: Diagnosis not present

## 2020-05-01 DIAGNOSIS — M25572 Pain in left ankle and joints of left foot: Secondary | ICD-10-CM | POA: Diagnosis not present

## 2020-05-03 DIAGNOSIS — M79642 Pain in left hand: Secondary | ICD-10-CM | POA: Diagnosis not present

## 2020-05-03 DIAGNOSIS — M79641 Pain in right hand: Secondary | ICD-10-CM | POA: Diagnosis not present

## 2020-05-03 DIAGNOSIS — M25572 Pain in left ankle and joints of left foot: Secondary | ICD-10-CM | POA: Diagnosis not present

## 2020-05-03 DIAGNOSIS — M6281 Muscle weakness (generalized): Secondary | ICD-10-CM | POA: Diagnosis not present

## 2020-05-03 DIAGNOSIS — M5459 Other low back pain: Secondary | ICD-10-CM | POA: Diagnosis not present

## 2020-05-03 DIAGNOSIS — M25511 Pain in right shoulder: Secondary | ICD-10-CM | POA: Diagnosis not present

## 2020-05-04 ENCOUNTER — Ambulatory Visit (INDEPENDENT_AMBULATORY_CARE_PROVIDER_SITE_OTHER): Payer: Medicare Other | Admitting: Internal Medicine

## 2020-05-04 ENCOUNTER — Other Ambulatory Visit: Payer: Self-pay

## 2020-05-04 ENCOUNTER — Encounter: Payer: Self-pay | Admitting: Internal Medicine

## 2020-05-04 DIAGNOSIS — R5383 Other fatigue: Secondary | ICD-10-CM

## 2020-05-04 DIAGNOSIS — I2510 Atherosclerotic heart disease of native coronary artery without angina pectoris: Secondary | ICD-10-CM

## 2020-05-04 DIAGNOSIS — M05711 Rheumatoid arthritis with rheumatoid factor of right shoulder without organ or systems involvement: Secondary | ICD-10-CM | POA: Diagnosis not present

## 2020-05-04 DIAGNOSIS — M961 Postlaminectomy syndrome, not elsewhere classified: Secondary | ICD-10-CM | POA: Diagnosis not present

## 2020-05-04 DIAGNOSIS — R609 Edema, unspecified: Secondary | ICD-10-CM | POA: Diagnosis not present

## 2020-05-04 DIAGNOSIS — M4626 Osteomyelitis of vertebra, lumbar region: Secondary | ICD-10-CM

## 2020-05-04 MED ORDER — HYDROMORPHONE HCL 2 MG PO TABS: 2.0000 mg | ORAL_TABLET | Freq: Three times a day (TID) | ORAL | 0 refills | Status: DC | PRN

## 2020-05-04 NOTE — Patient Instructions (Addendum)
Try a Bed Wedge Pillow for Sleeping - Memory Foam $30-50 for [name] and make your bed flat for a week or two

## 2020-05-04 NOTE — Progress Notes (Signed)
Subjective:  Patient ID: [name], male    DOB: November 02, 1945  Age: 75 y.o. MRN: [id number]  CC: Follow-up (6 week f/u)   HPI [name] presents for chronic pain f/u- bed is at 30 degrees - very bad for [name]'s pain LBP - worse Follow-up on hypertension, rheumatoid arthritis   Outpatient Medications Prior to Visit  Medication Sig Dispense Refill   albuterol (PROVENTIL HFA;VENTOLIN HFA) 108 (90 Base) MCG/ACT inhaler Inhale 1-2 puffs into the lungs every 4 (four) hours as needed for wheezing or shortness of breath. 1 Inhaler 1   ALPRAZolam (XANAX) 0.5 MG tablet Take 1 tablet (0.5 mg total) by mouth 3 (three) times daily as needed. for anxiety 60 tablet 3   Cholecalciferol (VITAMIN D3) 2000 UNITS capsule Take 1 capsule (2,000 Units total) by mouth daily. 100 capsule 3   clopidogrel (PLAVIX) 75 MG tablet TAKE 1 TABLET BY MOUTH EVERY DAY WITH BREAKFAST 90 tablet 3   FLUoxetine (PROZAC) 40 MG capsule Take 40 mg by mouth daily.     fluticasone (FLONASE) 50 MCG/ACT nasal spray INSTILL 2 SPRAYS IN EACH NOSTRIL EVERY DAY 16 g 11   gabapentin (NEURONTIN) 300 MG capsule TAKE 1 CAPSULE(300 MG) BY MOUTH TWICE DAILY 180 capsule 1   hydrochlorothiazide (MICROZIDE) 12.5 MG capsule Take 1 capsule (12.5 mg total) by mouth daily. 90 capsule 3   HYDROmorphone (DILAUDID) 2 MG tablet Take 1-1.5 tablets (2-3 mg total) by mouth every 8 (eight) hours as needed for severe pain. 90 tablet 0   lidocaine-prilocaine (EMLA) cream Apply 1 application topically as needed. 30 g 5   losartan (COZAAR) 50 MG tablet Take 1 tablet (50 mg total) by mouth daily. 90 tablet 3   Melatonin 5 MG TABS Take 5 mg by mouth at bedtime. Takes 10 mg at HS     metoprolol tartrate (LOPRESSOR) 25 MG tablet TAKE 1 TABLET (25 MG TOTAL) BY MOUTH 2 (TWO) TIMES DAILY. 180 tablet 3   Omega-3 Fatty Acids (FISH OIL OMEGA-3) 1000 MG CAPS Take 1,000 mg by mouth daily.      ondansetron (ZOFRAN) 8 MG tablet Take 1 tablet (8 mg total) by  mouth 3 (three) times daily. 90 tablet 3   pantoprazole (PROTONIX) 40 MG tablet TAKE 1 TABLET (40 MG TOTAL) BY MOUTH EVERY EVENING. (Patient taking differently: Take 40 mg by mouth every evening. Pt takes 1/2 a tablet daily) 90 tablet 3   pravastatin (PRAVACHOL) 20 MG tablet Take 1 tablet (20 mg total) by mouth daily. 90 tablet 1   predniSONE (DELTASONE) 10 MG tablet 20 mg po qam pc or as directed 60 tablet 1   predniSONE (DELTASONE) 5 MG tablet TAKE 2 TABLETS (10 MG TOTAL) BY MOUTH DAILY WITH BREAKFAST. 180 tablet 1   prochlorperazine (COMPAZINE) 10 MG tablet TAKE 1 TABLET(10 MG) BY MOUTH EVERY 6 HOURS AS NEEDED FOR NAUSEA OR VOMITING 385 tablet 1   Pyridoxine HCl (B-6 PO) Take 1 tablet by mouth.      tadalafil (CIALIS) 5 MG tablet Take 5 mg by mouth as needed.      torsemide (DEMADEX) 10 MG tablet Take 1-2 tablets (10-20 mg total) by mouth daily. 60 tablet 3   traZODone (DESYREL) 100 MG tablet TAKE 1 TABLET AT BEDTIME 90 tablet 3   vitamin B-12 (CYANOCOBALAMIN) 1000 MCG tablet Take 1,000 mcg by mouth daily.      No facility-administered medications prior to visit.    ROS: Review of Systems  Constitutional:  Positive for fatigue. Negative for appetite change and unexpected weight change.  HENT: Negative for congestion, nosebleeds, sneezing, sore throat and trouble swallowing.   Eyes: Negative for itching and visual disturbance.  Respiratory: Negative for cough.   Cardiovascular: Negative for chest pain, palpitations and leg swelling.  Gastrointestinal: Negative for abdominal distention, blood in stool, diarrhea and nausea.  Genitourinary: Negative for frequency and hematuria.  Musculoskeletal: Positive for arthralgias, back pain, gait problem, joint swelling, neck pain and neck stiffness.  Skin: Negative for rash.  Neurological: Positive for weakness. Negative for dizziness, tremors and speech difficulty.  Psychiatric/Behavioral: Positive for decreased concentration. Negative for  agitation, dysphoric mood, sleep disturbance and suicidal ideas. The patient is not nervous/anxious.     Objective:  BP 102/62 (BP Location: Left Arm)    Pulse 62    Temp 98 F (36.7 C) (Oral)    Wt 147 lb 12.8 oz (67 kg)    SpO2 94%    BMI 23.86 kg/m   BP Readings from Last 3 Encounters:  05/04/20 102/62  03/22/20 (!) 145/95  03/21/20 130/82    Wt Readings from Last 3 Encounters:  05/04/20 147 lb 12.8 oz (67 kg)  03/21/20 150 lb 3.2 oz (68.1 kg)  03/08/20 150 lb 3.2 oz (68.1 kg)    Physical Exam Constitutional:      General: He is not in acute distress.    Appearance: He is well-developed. He is obese.     Comments: NAD  HENT:     Mouth/Throat:     Mouth: Oropharynx is clear and moist.  Eyes:     Conjunctiva/sclera: Conjunctivae normal.     Pupils: Pupils are equal, round, and reactive to light.  Neck:     Thyroid: No thyromegaly.     Vascular: No JVD.  Cardiovascular:     Rate and Rhythm: Normal rate and regular rhythm.     Pulses: Intact distal pulses.     Heart sounds: Normal heart sounds. No murmur heard. No friction rub. No gallop.   Pulmonary:     Effort: Pulmonary effort is normal. No respiratory distress.     Breath sounds: Normal breath sounds. No wheezing or rales.  Chest:     Chest wall: No tenderness.  Abdominal:     General: Bowel sounds are normal. There is no distension.     Palpations: Abdomen is soft. There is no mass.     Tenderness: There is no abdominal tenderness. There is no guarding or rebound.  Musculoskeletal:        General: Tenderness present. No edema. Normal range of motion.     Cervical back: Normal range of motion.  Lymphadenopathy:     Cervical: No cervical adenopathy.  Skin:    General: Skin is warm and dry.     Findings: No rash.  Neurological:     Mental Status: He is alert and oriented to person, place, and time.     Cranial Nerves: No cranial nerve deficit.     Motor: No abnormal muscle tone.     Coordination: He  displays a negative Romberg sign. Coordination abnormal.     Gait: Gait abnormal.     Deep Tendon Reflexes: Reflexes are normal and symmetric.  Psychiatric:        Mood and Affect: Mood and affect normal.        Behavior: Behavior normal.        Thought Content: Thought content normal.  Judgment: Judgment normal.    RA deformities, ataxic  Lab Results  Component Value Date   WBC 8.2 03/22/2020   HGB 12.4 (L) 03/22/2020   HCT 36.7 (L) 03/22/2020   PLT 225 03/22/2020   GLUCOSE 105 (H) 03/22/2020   CHOL 128 03/10/2015   TRIG 95.0 03/10/2015   HDL 39.30 03/10/2015   LDLCALC 70 03/10/2015   ALT 9 03/22/2020   AST 12 (L) 03/22/2020   NA 126 (L) 03/22/2020   K 4.2 03/22/2020   CL 89 (L) 03/22/2020   CREATININE 0.90 03/22/2020   BUN 16 03/22/2020   CO2 31 03/22/2020   TSH 0.680 09/18/2017   PSA 1.95 10/15/2010   INR 1.1 (H) 06/14/2014   HGBA1C 6.0 09/28/2015    CT THORACIC SPINE W CONTRAST  Result Date: 10/14/2019 CLINICAL DATA:  Spondylosis without myelopathy. Chronic back pain and bilateral leg symptoms. FLUOROSCOPY TIME:  1 minutes 56 seconds. 508.29 micro gray meter squared PROCEDURE: LUMBAR PUNCTURE FOR THORACIC AND LUMBAR MYELOGRAM After thorough discussion of risks and benefits of the procedure including bleeding, infection, injury to nerves, blood vessels, adjacent structures as well as headache and CSF leak, written and oral informed consent was obtained. Consent was obtained by Dr. Nelson Chimes. Patient was positioned prone on the fluoroscopy table. Local anesthesia was provided with 1% lidocaine without epinephrine after prepped and draped in the usual sterile fashion. Puncture was performed at L5 using a 3 1/2 inch 22-gauge spinal needle via right paramedian approach. Using a single pass through the dura, the needle was placed within the thecal sac, with return of clear CSF. 10 mL of Isovue M-300 was injected into the thecal sac, with normal opacification of the nerve  roots and cauda equina consistent with free flow within the subarachnoid space. The patient was then moved to the trendelenburg position and contrast flowed into the Thoracic spine region. I personally performed the lumbar puncture and administered the intrathecal contrast. I also personally performed acquisition of the myelogram images. TECHNIQUE: Contiguous axial images were obtained through the Thoracic and Lumbar spine after the intrathecal infusion of infusion. Coronal and sagittal reconstructions were obtained of the axial image sets. FINDINGS: THORACIC AND LUMBAR MYELOGRAM FINDINGS: Lumbar region: Previous decompression and fusion procedure from L1-S1. Chronic screw fractures at S1. Wide patency of the spinal canal throughout that region. Flexion extension views do not show any visible lumbar motion. Thoracic region: Thoracic curvature convex to the right. 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 No apparent compressive central canal stenosis. See results of CT scan. CT THORACIC MYELOGRAM FINDINGS: Thoracic curvature convex to the right with the apex at about T8. Chronic degenerative anterolisthesis at C7-T1, with apparent facet joint effusion. No significant disc pathology at T1-2 or T2-3. From T3-4 through T12-L1, the thoracic discs show degeneration with loss of disc height and areas of vacuum phenomenon. Small endplate osteophytes. No compressive stenosis of the thoracic spinal canal. Foramina are sufficiently patent. Ordinary mild thoracic facet osteoarthritis without pronounced hypertrophic change. CT LUMBAR MYELOGRAM FINDINGS: T12-L1: Mild bulging of the disc. No canal or foraminal stenosis. Conus tip is at L1. L1-2: Previous fusion procedure with pedicle screws and posterior rods. Screw on the left at L1 appears to breach the medial cortex of the pedicle on a chronic basis but does not cause visible neural compression. L2-3: Previous fusion  procedure with pedicle screws and posterior rods. Wide patency of the canal and foramina. Right-sided L3 screw breeches the medial  cortex of the pedicle without distinct neural compression. L3-4: Previous fusion procedure with pedicle screws and posterior rods. Wide patency of the canal. Right-sided pedicle screw at L4 breaches the medial pedicle and traverses the right lateral recess. Loss of the bracket attachment of the right screw to the posterior rod. L4-5: Previous fusion procedure with pedicle screws and posterior rods. Wide patency of the canal and foramina. 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 L5-S1: Previous fusion procedure with pedicle screws and posterior rods. Chronic S1 screw fractures. There may not be solid union at this level, with potential for ongoing motion. No compressive canal stenosis. Mild bilateral foraminal narrowing, not grossly compressive. 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 Contrast is excluded from the tip of the thecal sac. IMPRESSION: Lumbar region: Previous posterior decompression and fusion procedure from L1 to the sacrum. Solid union from L1 through L5. Possible nonunion at L5-S1. Sufficient patency of the canal and foramina. Some of the pedicle screws do breach the medial pedicle cortices as outlined above. Chronic S1 screw fractures. Right screw at L4 is no longer fully attached by the bracket to the posterior rod. 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 Distal thecal sac is excluded from the contrast by pronounced arachnoiditis. 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 Mild thoracic spinal curvature. No compressive stenosis of the spinal canal or likely significant foraminal stenosis. Some facet osteoarthritis could contribute to back pain. Electronically Signed   By: Nelson Chimes M.D.   On: 10/14/2019 11:04   CT LUMBAR SPINE W  CONTRAST  Result Date: 10/14/2019 CLINICAL DATA:  Spondylosis without myelopathy. Chronic back pain and bilateral leg symptoms. FLUOROSCOPY TIME:  1 minutes 56 seconds. 508.29 micro gray meter squared PROCEDURE: LUMBAR PUNCTURE FOR THORACIC AND LUMBAR MYELOGRAM After thorough discussion of risks and benefits of the procedure including bleeding, infection, injury to nerves, blood vessels, adjacent structures as well as headache and CSF leak, written and oral informed consent was obtained. Consent was obtained by Dr. Nelson Chimes. Patient was positioned prone on the fluoroscopy table. Local anesthesia was provided with 1% lidocaine without epinephrine after prepped and draped in the usual sterile fashion. Puncture was performed at L5 using a 3 1/2 inch 22-gauge spinal needle via right paramedian approach. Using a single pass through the dura, the needle was placed within the thecal sac, with return of clear CSF. 10 mL of Isovue M-300 was injected into the thecal sac, with normal opacification of the nerve roots and cauda equina consistent with free flow within the subarachnoid space. The patient was then moved to the trendelenburg position and contrast flowed into the Thoracic spine region. I personally performed the lumbar puncture and administered the intrathecal contrast. I also personally performed acquisition of the myelogram images. TECHNIQUE: Contiguous axial images were obtained through the Thoracic and Lumbar spine after the intrathecal infusion of infusion. Coronal and sagittal reconstructions were obtained of the axial image sets. FINDINGS: THORACIC AND LUMBAR MYELOGRAM FINDINGS: Lumbar region: Previous decompression and fusion procedure from L1-S1. Chronic screw fractures at S1. Wide patency of the spinal canal throughout that region. Flexion extension views do not show any visible lumbar motion. Thoracic region: Thoracic curvature convex to the right. Degenerative disc disease throughout the thoracic  spine with anterior extradural defects throughout. No apparent compressive central canal stenosis. See results of CT scan. CT THORACIC MYELOGRAM FINDINGS: Thoracic curvature convex to the right with the apex at about  T8. Chronic degenerative anterolisthesis at C7-T1, with apparent facet joint effusion. No significant disc pathology at T1-2 or T2-3. From T3-4 through T12-L1, the thoracic discs show degeneration with loss of disc height and areas of vacuum phenomenon. Small endplate osteophytes. No compressive stenosis of the thoracic spinal canal. Foramina are sufficiently patent. Ordinary mild thoracic facet osteoarthritis without pronounced hypertrophic change. CT LUMBAR MYELOGRAM FINDINGS: T12-L1: Mild bulging of the disc. No canal or foraminal stenosis. Conus tip is at L1. L1-2: Previous fusion procedure with pedicle screws and posterior rods. Screw on the left at L1 appears to breach the medial cortex of the pedicle on a chronic basis but does not cause visible neural compression. L2-3: Previous fusion procedure with pedicle screws and posterior rods. Wide patency of the canal and foramina. Right-sided L3 screw breeches the medial cortex of the pedicle without distinct neural compression. L3-4: Previous fusion procedure with pedicle screws and posterior rods. Wide patency of the canal. Right-sided pedicle screw at L4 breaches the medial pedicle and traverses the right lateral recess. Loss of the bracket attachment of the right screw to the posterior rod. L4-5: Previous fusion procedure with pedicle screws and posterior rods. Wide patency of the canal and foramina. 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 L5-S1: Previous fusion procedure with pedicle screws and posterior rods. Chronic S1 screw fractures. There may not be solid union at this level, with potential for ongoing motion. No compressive canal stenosis. Mild bilateral foraminal narrowing, not grossly compressive. Pronounced  arachnoiditis pattern in the distal thecal sac. Contrast is excluded from the tip of the thecal sac. IMPRESSION: Lumbar region: Previous posterior decompression and fusion procedure from L1 to the sacrum. Solid union from L1 through L5. Possible nonunion at L5-S1. Sufficient patency of the canal and foramina. Some of the pedicle screws do breach the medial pedicle cortices as outlined above. Chronic S1 screw fractures. Right screw at L4 is no longer fully attached by the bracket to the posterior rod. 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 Distal thecal sac is excluded from the contrast by pronounced arachnoiditis. 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 Mild thoracic spinal curvature. No compressive stenosis of the spinal canal or likely significant foraminal stenosis. Some facet osteoarthritis could contribute to back pain. Electronically Signed   By: Nelson Chimes M.D.   On: 10/14/2019 11:04   DG MYELOGRAPHY LUMBAR INJ MULTI REGION  Result Date: 10/14/2019 CLINICAL DATA:  Spondylosis without myelopathy. Chronic back pain and bilateral leg symptoms. FLUOROSCOPY TIME:  1 minutes 56 seconds. 508.29 micro gray meter squared PROCEDURE: LUMBAR PUNCTURE FOR THORACIC AND LUMBAR MYELOGRAM After thorough discussion of risks and benefits of the procedure including bleeding, infection, injury to nerves, blood vessels, adjacent structures as well as headache and CSF leak, written and oral informed consent was obtained. Consent was obtained by Dr. Nelson Chimes. Patient was positioned prone on the fluoroscopy table. Local anesthesia was provided with 1% lidocaine without epinephrine after prepped and draped in the usual sterile fashion. Puncture was performed at L5 using a 3 1/2 inch 22-gauge spinal needle via right paramedian approach. Using a single pass through the dura, the needle was placed within the thecal sac, with return  of clear CSF. 10 mL of Isovue M-300 was injected into the thecal sac, with normal opacification of the nerve roots and cauda equina consistent with free flow within the subarachnoid space. The patient was then moved to the trendelenburg position  and contrast flowed into the Thoracic spine region. I personally performed the lumbar puncture and administered the intrathecal contrast. I also personally performed acquisition of the myelogram images. TECHNIQUE: Contiguous axial images were obtained through the Thoracic and Lumbar spine after the intrathecal infusion of infusion. Coronal and sagittal reconstructions were obtained of the axial image sets. FINDINGS: THORACIC AND LUMBAR MYELOGRAM FINDINGS: Lumbar region: Previous decompression and fusion procedure from L1-S1. Chronic screw fractures at S1. Wide patency of the spinal canal throughout that region. Flexion extension views do not show any visible lumbar motion. Thoracic region: Thoracic curvature convex to the right. 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 No apparent compressive central canal stenosis. See results of CT scan. CT THORACIC MYELOGRAM FINDINGS: Thoracic curvature convex to the right with the apex at about T8. Chronic degenerative anterolisthesis at C7-T1, with apparent facet joint effusion. No significant disc pathology at T1-2 or T2-3. From T3-4 through T12-L1, the thoracic discs show degeneration with loss of disc height and areas of vacuum phenomenon. Small endplate osteophytes. No compressive stenosis of the thoracic spinal canal. Foramina are sufficiently patent. Ordinary mild thoracic facet osteoarthritis without pronounced hypertrophic change. CT LUMBAR MYELOGRAM FINDINGS: T12-L1: Mild bulging of the disc. No canal or foraminal stenosis. Conus tip is at L1. L1-2: Previous fusion procedure with pedicle screws and posterior rods. Screw on the left at L1 appears to breach the medial  cortex of the pedicle on a chronic basis but does not cause visible neural compression. L2-3: Previous fusion procedure with pedicle screws and posterior rods. Wide patency of the canal and foramina. Right-sided L3 screw breeches the medial cortex of the pedicle without distinct neural compression. L3-4: Previous fusion procedure with pedicle screws and posterior rods. Wide patency of the canal. Right-sided pedicle screw at L4 breaches the medial pedicle and traverses the right lateral recess. Loss of the bracket attachment of the right screw to the posterior rod. L4-5: Previous fusion procedure with pedicle screws and posterior rods. Wide patency of the canal and foramina. 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 L5-S1: Previous fusion procedure with pedicle screws and posterior rods. Chronic S1 screw fractures. There may not be solid union at this level, with potential for ongoing motion. No compressive canal stenosis. Mild bilateral foraminal narrowing, not grossly compressive. 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 Contrast is excluded from the tip of the thecal sac. IMPRESSION: Lumbar region: Previous posterior decompression and fusion procedure from L1 to the sacrum. Solid union from L1 through L5. Possible nonunion at L5-S1. Sufficient patency of the canal and foramina. Some of the pedicle screws do breach the medial pedicle cortices as outlined above. Chronic S1 screw fractures. Right screw at L4 is no longer fully attached by the bracket to the posterior rod. 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 Distal thecal sac is excluded from the contrast by pronounced arachnoiditis. 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 Mild thoracic spinal curvature. No compressive stenosis of the spinal canal or likely significant foraminal stenosis. Some facet osteoarthritis could contribute to  back pain. Electronically Signed   By: Nelson Chimes M.D.   On: 10/14/2019 11:04    Assessment & Plan:    [name], MD

## 2020-05-04 NOTE — Assessment & Plan Note (Signed)
Try a Bed Wedge Pillow for Sleeping - Memory Foam $30-50 for Debbie and make your bed flat for a week or two

## 2020-05-04 NOTE — Assessment & Plan Note (Signed)
On Phlebotomies now

## 2020-05-05 DIAGNOSIS — M79642 Pain in left hand: Secondary | ICD-10-CM | POA: Diagnosis not present

## 2020-05-05 DIAGNOSIS — M25572 Pain in left ankle and joints of left foot: Secondary | ICD-10-CM | POA: Diagnosis not present

## 2020-05-05 DIAGNOSIS — M6281 Muscle weakness (generalized): Secondary | ICD-10-CM | POA: Diagnosis not present

## 2020-05-05 DIAGNOSIS — M25511 Pain in right shoulder: Secondary | ICD-10-CM | POA: Diagnosis not present

## 2020-05-05 DIAGNOSIS — M5459 Other low back pain: Secondary | ICD-10-CM | POA: Diagnosis not present

## 2020-05-05 DIAGNOSIS — M79641 Pain in right hand: Secondary | ICD-10-CM | POA: Diagnosis not present

## 2020-05-08 DIAGNOSIS — M79641 Pain in right hand: Secondary | ICD-10-CM | POA: Diagnosis not present

## 2020-05-08 DIAGNOSIS — M79642 Pain in left hand: Secondary | ICD-10-CM | POA: Diagnosis not present

## 2020-05-08 DIAGNOSIS — M25572 Pain in left ankle and joints of left foot: Secondary | ICD-10-CM | POA: Diagnosis not present

## 2020-05-08 DIAGNOSIS — Z20828 Contact with and (suspected) exposure to other viral communicable diseases: Secondary | ICD-10-CM | POA: Diagnosis not present

## 2020-05-08 DIAGNOSIS — M5459 Other low back pain: Secondary | ICD-10-CM | POA: Diagnosis not present

## 2020-05-08 DIAGNOSIS — M25511 Pain in right shoulder: Secondary | ICD-10-CM | POA: Diagnosis not present

## 2020-05-08 DIAGNOSIS — M6281 Muscle weakness (generalized): Secondary | ICD-10-CM | POA: Diagnosis not present

## 2020-05-08 DIAGNOSIS — Z1159 Encounter for screening for other viral diseases: Secondary | ICD-10-CM | POA: Diagnosis not present

## 2020-05-10 DIAGNOSIS — M25511 Pain in right shoulder: Secondary | ICD-10-CM | POA: Diagnosis not present

## 2020-05-10 DIAGNOSIS — M25572 Pain in left ankle and joints of left foot: Secondary | ICD-10-CM | POA: Diagnosis not present

## 2020-05-10 DIAGNOSIS — R262 Difficulty in walking, not elsewhere classified: Secondary | ICD-10-CM | POA: Diagnosis not present

## 2020-05-10 DIAGNOSIS — M5459 Other low back pain: Secondary | ICD-10-CM | POA: Diagnosis not present

## 2020-05-10 DIAGNOSIS — M79641 Pain in right hand: Secondary | ICD-10-CM | POA: Diagnosis not present

## 2020-05-10 DIAGNOSIS — M6281 Muscle weakness (generalized): Secondary | ICD-10-CM | POA: Diagnosis not present

## 2020-05-10 DIAGNOSIS — M79642 Pain in left hand: Secondary | ICD-10-CM | POA: Diagnosis not present

## 2020-05-11 ENCOUNTER — Inpatient Hospital Stay: Payer: Medicare Other | Admitting: Family

## 2020-05-11 ENCOUNTER — Inpatient Hospital Stay: Payer: Medicare Other

## 2020-05-12 DIAGNOSIS — M79641 Pain in right hand: Secondary | ICD-10-CM | POA: Diagnosis not present

## 2020-05-12 DIAGNOSIS — M25572 Pain in left ankle and joints of left foot: Secondary | ICD-10-CM | POA: Diagnosis not present

## 2020-05-12 DIAGNOSIS — M6281 Muscle weakness (generalized): Secondary | ICD-10-CM | POA: Diagnosis not present

## 2020-05-12 DIAGNOSIS — M79642 Pain in left hand: Secondary | ICD-10-CM | POA: Diagnosis not present

## 2020-05-12 DIAGNOSIS — M25511 Pain in right shoulder: Secondary | ICD-10-CM | POA: Diagnosis not present

## 2020-05-12 DIAGNOSIS — M5459 Other low back pain: Secondary | ICD-10-CM | POA: Diagnosis not present

## 2020-05-15 DIAGNOSIS — Z20828 Contact with and (suspected) exposure to other viral communicable diseases: Secondary | ICD-10-CM | POA: Diagnosis not present

## 2020-05-15 DIAGNOSIS — M79641 Pain in right hand: Secondary | ICD-10-CM | POA: Diagnosis not present

## 2020-05-15 DIAGNOSIS — M25572 Pain in left ankle and joints of left foot: Secondary | ICD-10-CM | POA: Diagnosis not present

## 2020-05-15 DIAGNOSIS — M6281 Muscle weakness (generalized): Secondary | ICD-10-CM | POA: Diagnosis not present

## 2020-05-15 DIAGNOSIS — Z1159 Encounter for screening for other viral diseases: Secondary | ICD-10-CM | POA: Diagnosis not present

## 2020-05-15 DIAGNOSIS — M5459 Other low back pain: Secondary | ICD-10-CM | POA: Diagnosis not present

## 2020-05-15 DIAGNOSIS — M25511 Pain in right shoulder: Secondary | ICD-10-CM | POA: Diagnosis not present

## 2020-05-15 DIAGNOSIS — M79642 Pain in left hand: Secondary | ICD-10-CM | POA: Diagnosis not present

## 2020-05-16 ENCOUNTER — Telehealth: Payer: Self-pay | Admitting: Internal Medicine

## 2020-05-16 NOTE — Telephone Encounter (Signed)
Patient called and said that he is having middle to left upper quadrant pain. He said that it is a stabbing continuous pain that started last night and has not stopped. He said that it kept him up through the night. Transferred to [employer] at [facility].

## 2020-05-16 NOTE — Telephone Encounter (Signed)
[facility] Report/Call: ---Caller has abdominal pain, back pain, nausea. Describes pain as constant and severe. Symptoms began a couple days ago of not feeling good, got much worse last night.  Advised go to ED now.  Patient agreed to go.

## 2020-05-17 ENCOUNTER — Inpatient Hospital Stay: Payer: Medicare Other

## 2020-05-17 ENCOUNTER — Inpatient Hospital Stay: Payer: Medicare Other | Admitting: Family

## 2020-05-17 NOTE — Telephone Encounter (Signed)
Agree. Thanks

## 2020-05-22 DIAGNOSIS — Z1159 Encounter for screening for other viral diseases: Secondary | ICD-10-CM | POA: Diagnosis not present

## 2020-05-22 DIAGNOSIS — Z20828 Contact with and (suspected) exposure to other viral communicable diseases: Secondary | ICD-10-CM | POA: Diagnosis not present

## 2020-05-23 ENCOUNTER — Other Ambulatory Visit: Payer: Self-pay | Admitting: Internal Medicine

## 2020-05-24 ENCOUNTER — Other Ambulatory Visit: Payer: Self-pay | Admitting: Family

## 2020-05-24 ENCOUNTER — Telehealth: Payer: Self-pay | Admitting: *Deleted

## 2020-05-24 DIAGNOSIS — M79642 Pain in left hand: Secondary | ICD-10-CM | POA: Diagnosis not present

## 2020-05-24 DIAGNOSIS — C4372 Malignant melanoma of left lower limb, including hip: Secondary | ICD-10-CM

## 2020-05-24 DIAGNOSIS — M6281 Muscle weakness (generalized): Secondary | ICD-10-CM | POA: Diagnosis not present

## 2020-05-24 DIAGNOSIS — W19XXXA Unspecified fall, initial encounter: Secondary | ICD-10-CM

## 2020-05-24 DIAGNOSIS — M79641 Pain in right hand: Secondary | ICD-10-CM | POA: Diagnosis not present

## 2020-05-24 DIAGNOSIS — R42 Dizziness and giddiness: Secondary | ICD-10-CM

## 2020-05-24 DIAGNOSIS — M25572 Pain in left ankle and joints of left foot: Secondary | ICD-10-CM | POA: Diagnosis not present

## 2020-05-24 DIAGNOSIS — M5459 Other low back pain: Secondary | ICD-10-CM | POA: Diagnosis not present

## 2020-05-24 DIAGNOSIS — M25511 Pain in right shoulder: Secondary | ICD-10-CM | POA: Diagnosis not present

## 2020-05-24 NOTE — Telephone Encounter (Signed)
Patient called stating that he has an appt with Laverna Peace NP tomorrow for lab, visit and phlebotomy.  Patient does not want a phlebotomy but wants to let us know that he has had problems with dizziness and falls.  Questioned whether he should have a CT scan while he is here.  Scan ordered by Laverna Peace.  Appts made.  Patient aware of instructions

## 2020-05-25 ENCOUNTER — Other Ambulatory Visit: Payer: Self-pay | Admitting: *Deleted

## 2020-05-25 ENCOUNTER — Ambulatory Visit (HOSPITAL_BASED_OUTPATIENT_CLINIC_OR_DEPARTMENT_OTHER)
Admission: RE | Admit: 2020-05-25 | Discharge: 2020-05-25 | Disposition: A | Discharge status: Home / Self Care | Payer: Medicare Other | Source: Ambulatory Visit | Attending: Family | Admitting: Family

## 2020-05-25 ENCOUNTER — Other Ambulatory Visit: Payer: Self-pay | Admitting: Family

## 2020-05-25 ENCOUNTER — Inpatient Hospital Stay: Payer: Medicare Other

## 2020-05-25 ENCOUNTER — Inpatient Hospital Stay (HOSPITAL_BASED_OUTPATIENT_CLINIC_OR_DEPARTMENT_OTHER): Payer: Medicare Other | Admitting: Family

## 2020-05-25 ENCOUNTER — Inpatient Hospital Stay: Payer: Medicare Other | Attending: Hematology & Oncology

## 2020-05-25 ENCOUNTER — Other Ambulatory Visit: Payer: Self-pay

## 2020-05-25 ENCOUNTER — Telehealth: Payer: Self-pay | Admitting: Family

## 2020-05-25 ENCOUNTER — Encounter: Payer: Self-pay | Admitting: Family

## 2020-05-25 ENCOUNTER — Inpatient Hospital Stay: Payer: Medicare Other | Admitting: Family

## 2020-05-25 VITALS — BP 92/77 | HR 77 | Resp 18 | Ht 66.0 in | Wt 150.0 lb

## 2020-05-25 DIAGNOSIS — D500 Iron deficiency anemia secondary to blood loss (chronic): Secondary | ICD-10-CM

## 2020-05-25 DIAGNOSIS — I6782 Cerebral ischemia: Secondary | ICD-10-CM | POA: Diagnosis not present

## 2020-05-25 DIAGNOSIS — D649 Anemia, unspecified: Secondary | ICD-10-CM | POA: Diagnosis not present

## 2020-05-25 DIAGNOSIS — R42 Dizziness and giddiness: Secondary | ICD-10-CM

## 2020-05-25 DIAGNOSIS — W19XXXA Unspecified fall, initial encounter: Secondary | ICD-10-CM | POA: Diagnosis not present

## 2020-05-25 DIAGNOSIS — C4372 Malignant melanoma of left lower limb, including hip: Secondary | ICD-10-CM

## 2020-05-25 LAB — LACTATE DEHYDROGENASE: LDH: 207 U/L — ABNORMAL HIGH (ref 98–192)

## 2020-05-25 LAB — CBC WITH DIFFERENTIAL (CANCER CENTER ONLY)
Abs Immature Granulocytes: 0.13 10*3/uL — ABNORMAL HIGH (ref 0.00–0.07)
Basophils Absolute: 0 10*3/uL (ref 0.0–0.1)
Basophils Relative: 0 %
Eosinophils Absolute: 0 10*3/uL (ref 0.0–0.5)
Eosinophils Relative: 0 %
HCT: 26.6 % — ABNORMAL LOW (ref 39.0–52.0)
Hemoglobin: 8.7 g/dL — ABNORMAL LOW (ref 13.0–17.0)
Immature Granulocytes: 1 %
Lymphocytes Relative: 20 %
Lymphs Abs: 1.8 10*3/uL (ref 0.7–4.0)
MCH: 30.7 pg (ref 26.0–34.0)
MCHC: 32.7 g/dL (ref 30.0–36.0)
MCV: 94 fL (ref 80.0–100.0)
Monocytes Absolute: 1.2 10*3/uL — ABNORMAL HIGH (ref 0.1–1.0)
Monocytes Relative: 13 %
Neutro Abs: 6.2 10*3/uL (ref 1.7–7.7)
Neutrophils Relative %: 66 %
Platelet Count: 363 10*3/uL (ref 150–400)
RBC: 2.83 MIL/uL — ABNORMAL LOW (ref 4.22–5.81)
RDW: 13.5 % (ref 11.5–15.5)
WBC Count: 9.3 10*3/uL (ref 4.0–10.5)
nRBC: 0 % (ref 0.0–0.2)

## 2020-05-25 LAB — RETICULOCYTES
Immature Retic Fract: 16.1 % — ABNORMAL HIGH (ref 2.3–15.9)
RBC.: 2.85 MIL/uL — ABNORMAL LOW (ref 4.22–5.81)
Retic Count, Absolute: 151.3 10*3/uL (ref 19.0–186.0)
Retic Ct Pct: 5.3 % — ABNORMAL HIGH (ref 0.4–3.1)

## 2020-05-25 LAB — COMPREHENSIVE METABOLIC PANEL
ALT: 8 U/L (ref 0–44)
AST: 11 U/L — ABNORMAL LOW (ref 15–41)
Albumin: 3.6 g/dL (ref 3.5–5.0)
Alkaline Phosphatase: 41 U/L (ref 38–126)
Anion gap: 7 (ref 5–15)
BUN: 74 mg/dL — ABNORMAL HIGH (ref 8–23)
CO2: 32 mmol/L (ref 22–32)
Calcium: 8.2 mg/dL — ABNORMAL LOW (ref 8.9–10.3)
Chloride: 87 mmol/L — ABNORMAL LOW (ref 98–111)
Creatinine, Ser: 2.32 mg/dL — ABNORMAL HIGH (ref 0.61–1.24)
GFR, Estimated: 29 mL/min — ABNORMAL LOW (ref >60)
Glucose, Bld: 94 mg/dL (ref 70–99)
Potassium: 3.6 mmol/L (ref 3.5–5.1)
Sodium: 126 mmol/L — ABNORMAL LOW (ref 135–145)
Total Bilirubin: 1 mg/dL (ref 0.3–1.2)
Total Protein: 5.9 g/dL — ABNORMAL LOW (ref 6.5–8.1)

## 2020-05-25 LAB — PREPARE RBC (CROSSMATCH)

## 2020-05-25 LAB — SAVE SMEAR(SSMR), FOR PROVIDER SLIDE REVIEW

## 2020-05-25 MED ORDER — HEPARIN SOD (PORK) LOCK FLUSH 100 UNIT/ML IV SOLN
500.0000 [IU] | Freq: Once | INTRAVENOUS | Status: AC
  Administered 2020-05-25: 500 [IU] via INTRAVENOUS
  Filled 2020-05-25: qty 5

## 2020-05-25 MED ORDER — LIDOCAINE-PRILOCAINE 2.5-2.5 % EX CREA: 1.0000 "application " | TOPICAL_CREAM | CUTANEOUS | 5 refills | Status: AC | PRN

## 2020-05-25 MED ORDER — SODIUM CHLORIDE 0.9 % IV SOLN
Freq: Once | INTRAVENOUS | Status: AC
  Filled 2020-05-25: qty 250

## 2020-05-25 MED ORDER — SODIUM CHLORIDE 0.9% FLUSH
10.0000 mL | INTRAVENOUS | Status: DC | PRN
  Administered 2020-05-25: 10 mL via INTRAVENOUS
  Filled 2020-05-25: qty 10

## 2020-05-25 MED ORDER — LIDOCAINE-PRILOCAINE 2.5-2.5 % EX CREA
TOPICAL_CREAM | CUTANEOUS | Status: AC
  Filled 2020-05-25: qty 30

## 2020-05-25 NOTE — Patient Instructions (Signed)
Dehydration, Adult Dehydration is condition in which there is not enough water or other fluids in the body. This happens when a person loses more fluids than he or she takes in. Important body parts cannot work right without the right amount of fluids. Any loss of fluids from the body can cause dehydration. Dehydration can be mild, worse, or very bad. It should be treated right away to keep it from getting very bad. What are the causes? This condition may be caused by:  Conditions that cause loss of water or other fluids, such as: ? Watery poop (diarrhea). ? Vomiting. ? Sweating a lot. ? Peeing (urinating) a lot.  Not drinking enough fluids, especially when you: ? Are ill. ? Are doing things that take a lot of energy to do.  Other illnesses and conditions, such as fever or infection.  Certain medicines, such as medicines that take extra fluid out of the body (diuretics).  Lack of safe drinking water.  Not being able to get enough water and food. What increases the risk? The following factors may make you more likely to develop this condition:  Having a long-term (chronic) illness that has not been treated the right way, such as: ? Diabetes. ? Heart disease. ? Kidney disease.  Being 73 years of age or older.  Having a disability.  Living in a place that is high above the ground or sea (high in altitude). The thinner, dried air causes more fluid loss.  Doing exercises that put stress on your body for a long time. What are the signs or symptoms? Symptoms of dehydration depend on how bad it is. Mild or worse dehydration  Thirst.  Dry lips or dry mouth.  Feeling dizzy or light-headed, especially when you stand up from sitting.  Muscle cramps.  Your body making: ? Dark pee (urine). Pee may be the color of tea. ? Less pee than normal. ? Less tears than normal.  Headache. Very bad dehydration  Changes in skin. Skin may: ? Be cold to the touch (clammy). ? Be blotchy  or pale. ? Not go back to normal right after you lightly pinch it and let it go.  Little or no tears, pee, or sweat.  Changes in vital signs, such as: ? Fast breathing. ? Low blood pressure. ? Weak pulse. ? Pulse that is more than 100 beats a minute when you are sitting still.  Other changes, such as: ? Feeling very thirsty. ? Eyes that look hollow (sunken). ? Cold hands and feet. ? Being mixed up (confused). ? Being very tired (lethargic) or having trouble waking from sleep. ? Short-term weight loss. ? Loss of consciousness. How is this treated? Treatment for this condition depends on how bad it is. Treatment should start right away. Do not wait until your condition gets very bad. Very bad dehydration is an emergency. You will need to go to a hospital.  Mild or worse dehydration can be treated at home. You may be asked to: ? Drink more fluids. ? Drink an oral rehydration solution (ORS). This drink helps get the right amounts of fluids and salts and minerals in the blood (electrolytes).  Very bad dehydration can be treated: ? With fluids through an IV tube. ? By getting normal levels of salts and minerals in your blood. This is often done by giving salts and minerals through a tube. The tube is passed through your nose and into your stomach. ? By treating the root cause. Follow these instructions at  home: Oral rehydration solution If told by your doctor, drink an ORS:  Make an ORS. Use instructions on the package.  Start by drinking small amounts, about  cup (120 mL) every 5-10 minutes.  Slowly drink more until you have had the amount that your doctor said to have. Eating and drinking  Drink enough clear fluid to keep your pee pale yellow. If you were told to drink an ORS, finish the ORS first. Then, start slowly drinking other clear fluids. Drink fluids such as: ? Water. Do not drink only water. Doing that can make the salt (sodium) level in your body get too low. ? Water  from ice chips you suck on. ? Fruit juice that you have added water to (diluted). ? Low-calorie sports drinks.  Eat foods that have the right amounts of salts and minerals, such as: ? Bananas. ? Oranges. ? Potatoes. ? Tomatoes. ? Spinach.  Do not drink alcohol.  Avoid: ? Drinks that have a lot of sugar. These include:  High-calorie sports drinks.  Fruit juice that you did not add water to.  Soda.  Caffeine. ? Foods that are greasy or have a lot of fat or sugar.         General instructions  Take over-the-counter and prescription medicines only as told by your doctor.  Do not take salt tablets. Doing that can make the salt level in your body get too high.  Return to your normal activities as told by your doctor. Ask your doctor what activities are safe for you.  Keep all follow-up visits as told by your doctor. This is important. Contact a doctor if:  You have pain in your belly (abdomen) and the pain: ? Gets worse. ? Stays in one place.  You have a rash.  You have a stiff neck.  You get angry or annoyed (irritable) more easily than normal.  You are more tired or have a harder time waking than normal.  You feel: ? Weak or dizzy. ? Very thirsty. Get help right away if you have:  Any symptoms of very bad dehydration.  Symptoms of vomiting, such as: ? You cannot eat or drink without vomiting. ? Your vomiting gets worse or does not go away. ? Your vomit has blood or green stuff in it.  Symptoms that get worse with treatment.  A fever.  A very bad headache.  Problems with peeing or pooping (having a bowel movement), such as: ? Watery poop that gets worse or does not go away. ? Blood in your poop (stool). This may cause poop to look black and tarry. ? Not peeing in 6-8 hours. ? Peeing only a small amount of very dark pee in 6-8 hours.  Trouble breathing. These symptoms may be an emergency. Do not wait to see if the symptoms will go away. Get  medical help right away. Call your local emergency services (911 in the [place] Do not drive yourself to the hospital. Summary  Dehydration is a condition in which there is not enough water or other fluids in the body. This happens when a person loses more fluids than he or she takes in.  Treatment for this condition depends on how bad it is. Treatment should be started right away. Do not wait until your condition gets very bad.  Drink enough clear fluid to keep your pee pale yellow. If you were told to drink an oral rehydration solution (ORS), finish the ORS first. Then, start slowly drinking other clear fluids.  Take over-the-counter and prescription medicines only as told by your doctor.  Get help right away if you have any symptoms of very bad dehydration. This information is not intended to replace advice given to you by your health care provider. Make sure you discuss any questions you have with your health care provider. Document Revised: 11/05/2018 Document Reviewed: 11/05/2018 Elsevier Patient Education  [place].

## 2020-05-25 NOTE — Telephone Encounter (Signed)
Appointments scheduled calendar printed per 2/17 los

## 2020-05-25 NOTE — Progress Notes (Signed)
Hematology and Oncology Follow Up Visit  [name] [id number] 1946-01-14 75 y.o. 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 He called yesterday complaining of dizziness with fall. He states that both he and his wife had a stomach virus starting last week with chills, nausea and vomiting.  He fell head first into the bathtub earlier this week while trying to go to the bathroom. CT of the head today thankfully was negative.  He is quite fatigued and weak. He notes dizziness still as well as SOB with any exertion.  His chloride is 87, sodium 126, BUN 74 and Creatinine 2.32. Hgb down to 8.7, WBC count 9.3 and platelets 363.  He has not noted any obvious blood loss. No abnormal bruising, no petechiae.  No fever, cough, rash, chest pain, palpitations, abdominal pain or changes in bladder habits.  He has swelling in both ankles. No redness or pitting noted at this time. Pedal pulses are 2+.  He admits that he has no appetite and has not been eating or hydrating much at all over the last week.   ECOG Performance Status: 1 - Symptomatic but completely ambulatory  Medications:  Allergies as of 05/25/2020      Reactions   Cefepime Hives, Shortness Of Breath   Cephaeline Hives   Morphine Swelling   A swollen stomach.   Morphine And Related Shortness Of Breath, Nausea And Vomiting, Swelling, Other (See Comments)   Agitation, tolerates dilaudid Other reaction(s): Other (See Comments) Agitation, tolerates dilaudid   Penicillins Hives, Shortness Of Breath, Rash   Has patient  had a PCN reaction causing immediate rash, facial/tongue/throat swelling, SOB or lightheadedness with hypotension: Yes Has patient had a PCN reaction causing severe rash involving mucus membranes or skin necrosis: Yes Has patient had a PCN reaction that required hospitalization No Has patient had a PCN reaction occurring within the last 10 years: No If all of the above answers are "NO", then may proceed with Cephalosporin use.   Doxycycline Rash   Oxycodone-acetaminophen Other (See Comments)   Patient doesn't remember what type of reaction.      Medication List       Accurate as of May 25, 2020 11:11 AM. If you have any questions, ask your nurse or doctor.        albuterol 108 (90 Base) MCG/ACT inhaler Commonly known as: VENTOLIN HFA Inhale 1-2 puffs into the lungs every 4 (four) hours as needed for wheezing or shortness of breath.   ALPRAZolam 0.5 MG tablet Commonly known as: XANAX Take 1 tablet (0.5 mg total) by mouth 3 (three) times daily as needed. for anxiety   B-6 PO Take 1 tablet by mouth.   clopidogrel 75 MG tablet Commonly known as: PLAVIX TAKE 1 TABLET BY MOUTH EVERY DAY WITH BREAKFAST   Fish Oil Omega-3 1000 MG Caps Take 1,000 mg by mouth daily.   FLUoxetine 40 MG capsule Commonly known as: PROZAC Take 40 mg by mouth daily.   fluticasone 50 MCG/ACT nasal spray Commonly known as: FLONASE INSTILL 2 SPRAYS IN EACH NOSTRIL EVERY DAY   gabapentin 300 MG capsule Commonly known as: NEURONTIN  TAKE 1 CAPSULE(300 MG) BY MOUTH TWICE DAILY   hydrochlorothiazide 12.5 MG capsule Commonly known as: Microzide Take 1 capsule (12.5 mg total) by mouth daily.   HYDROmorphone 2 MG tablet Commonly known as: DILAUDID Take 1-1.5 tablets (2-3 mg total) by mouth every 8 (eight) hours as needed for severe pain.   HYDROmorphone 2 MG tablet Commonly known as: DILAUDID Take 1 tablet (2 mg total) by mouth every 8 (eight) hours as needed for severe pain.    lidocaine-prilocaine cream Commonly known as: EMLA Apply 1 application topically as needed.   losartan 50 MG tablet Commonly known as: COZAAR Take 1 tablet (50 mg total) by mouth daily.   melatonin 5 MG Tabs Take 5 mg by mouth at bedtime. Takes 10 mg at HS   metoprolol tartrate 25 MG tablet Commonly known as: LOPRESSOR TAKE 1 TABLET (25 MG TOTAL) BY MOUTH 2 (TWO) TIMES DAILY.   ondansetron 8 MG tablet Commonly known as: ZOFRAN Take 1 tablet (8 mg total) by mouth 3 (three) times daily.   pantoprazole 40 MG tablet Commonly known as: PROTONIX TAKE 1 TABLET (40 MG TOTAL) BY MOUTH EVERY EVENING. What changed: additional instructions   pravastatin 20 MG tablet Commonly known as: PRAVACHOL Take 1 tablet (20 mg total) by mouth daily.   predniSONE 10 MG tablet Commonly known as: DELTASONE 20 mg po qam pc or as directed   predniSONE 5 MG tablet Commonly known as: DELTASONE TAKE 2 TABLETS (10 MG TOTAL) BY MOUTH DAILY WITH BREAKFAST.   prochlorperazine 10 MG tablet Commonly known as: COMPAZINE TAKE 1 TABLET(10 MG) BY MOUTH EVERY 6 HOURS AS NEEDED FOR NAUSEA OR VOMITING   tadalafil 5 MG tablet Commonly known as: CIALIS Take 5 mg by mouth as needed.   torsemide 10 MG tablet Commonly known as: DEMADEX TAKE 1 TO 2 TABLETS(10 TO 20 MG) BY MOUTH DAILY   traZODone 100 MG tablet Commonly known as: DESYREL TAKE 1 TABLET AT BEDTIME   vitamin B-12 1000 MCG tablet Commonly known as: CYANOCOBALAMIN Take 1,000 mcg by mouth daily.   Vitamin D3 50 MCG (2000 UT) capsule Take 1 capsule (2,000 Units total) by mouth daily.       Allergies:  Allergies  Allergen Reactions  . Cefepime Hives and Shortness Of Breath  . Cephaeline Hives  . Morphine Swelling    A swollen stomach.  . Morphine And Related Shortness Of Breath, Nausea And Vomiting, Swelling and Other (See Comments)    Agitation, tolerates dilaudid Other reaction(s): Other (See Comments) Agitation, tolerates dilaudid   . Penicillins Hives, Shortness Of Breath and Rash    Has patient had a PCN reaction causing immediate rash, facial/tongue/throat swelling, SOB or lightheadedness with hypotension: Yes Has patient had a PCN reaction causing severe rash involving mucus membranes or skin necrosis: Yes Has patient had a PCN reaction that required hospitalization No Has patient had a PCN reaction occurring within the last 10 years: No If all of the above answers are "NO", then may proceed with Cephalosporin use.   [name] Doxycycline Rash  . Oxycodone-Acetaminophen Other (See Comments)    Patient doesn't remember what type of reaction.    Past Medical History, Surgical history, Social history, and Family History were reviewed and updated.  Review of Systems: All other 10 point review of systems is negative.   Physical Exam:  height is _0  (1.676 m) and weight is 150 lb 0.6 oz (68.1 kg). His blood pressure is 92/77 and his pulse is  77. His respiration is 18 and oxygen saturation is 96%.   Wt Readings from Last 3 Encounters:  05/25/20 150 lb 0.6 oz (68.1 kg)  05/04/20 147 lb 12.8 oz (67 kg)  03/21/20 150 lb 3.2 oz (68.1 kg)    Ocular: Sclerae unicteric, pupils equal, round and reactive to light Ear-nose-throat: Oropharynx clear, dentition fair Lymphatic: No cervical, supraclavicular or axillary adenopathy Lungs no rales or rhonchi, good excursion bilaterally Heart regular rate and rhythm, no murmur appreciated Abd soft, nontender, positive bowel sounds, no liver or spleen tip palpated on exam, no fluid wave  MSK no focal spinal tenderness, no joint edema Neuro: non-focal, well-oriented, appropriate affect Breasts: Deferred   Lab Results  Component Value Date   WBC 9.3 05/25/2020   HGB 8.7 (L) 05/25/2020   HCT 26.6 (L) 05/25/2020   MCV 94.0 05/25/2020   PLT 363 05/25/2020   Lab Results  Component Value Date   FERRITIN 65 03/22/2020   IRON 85 03/22/2020   TIBC 223 03/22/2020   UIBC 137  03/22/2020   IRONPCTSAT 38 03/22/2020   Lab Results  Component Value Date   RETICCTPCT 2.2 02/09/2020   RBC 2.83 (L) 05/25/2020   No results found for: KPAFRELGTCHN, LAMBDASER, KAPLAMBRATIO No results found for: IGGSERUM, IGA, IGMSERUM No results found for: Kathrynn Ducking, MSPIKE, SPEI   Chemistry      Component Value Date/Time   NA 126 (L) 05/25/2020 0912   NA 140 04/10/2017 1046   NA 135 (L) 03/06/2017 1104   K 3.6 05/25/2020 0912   K 3.7 04/10/2017 1046   K 4.2 03/06/2017 1104   CL 87 (L) 05/25/2020 0912   CL 98 04/10/2017 1046   CO2 32 05/25/2020 0912   CO2 31 04/10/2017 1046   CO2 30 (H) 03/06/2017 1104   BUN 74 (H) 05/25/2020 0912   BUN 21 04/10/2017 1046   BUN 20.0 03/06/2017 1104   CREATININE 2.32 (H) 05/25/2020 0912   CREATININE 0.90 03/22/2020 1121   CREATININE 0.9 04/10/2017 1046   CREATININE 1.0 03/06/2017 1104      Component Value Date/Time   CALCIUM 8.2 (L) 05/25/2020 0912   CALCIUM 8.9 04/10/2017 1046   CALCIUM 9.4 03/06/2017 1104   ALKPHOS 41 05/25/2020 0912   ALKPHOS 65 04/10/2017 1046   ALKPHOS 78 03/06/2017 1104   AST 11 (L) 05/25/2020 0912   AST 12 (L) 03/22/2020 1121   AST 20 03/06/2017 1104   ALT 8 05/25/2020 0912   ALT 9 03/22/2020 1121   ALT 21 04/10/2017 1046   ALT 20 03/06/2017 1104   BILITOT 1.0 05/25/2020 0912   BILITOT 0.7 03/22/2020 1121   BILITOT 0.47 03/06/2017 1104       Impression and Plan: Mr. Matheson is a very pleasant 75yo caucasian gentleman with history of hemochromatosis,homozygous for the C282Y mutation which has not been a problem for himso far.  He is here today symptomatic with dehydration and anemia as mentioned above.  CT of the head was negative.  Iron studies are pending. We will get him set up for 1 unit of blood tomorrow.  He is receiving 1 liter of fluids right now.  Rectal exam completed with Tiffany, RN as chaperone. Hemoccult test negative.  Follow-up in 1  month.  They were encouraged to contact our office with any questions or concerns.   Laverna Peace, NP 2/17/202211:11 AM

## 2020-05-26 ENCOUNTER — Other Ambulatory Visit: Payer: Self-pay

## 2020-05-26 ENCOUNTER — Emergency Department (HOSPITAL_BASED_OUTPATIENT_CLINIC_OR_DEPARTMENT_OTHER): Payer: Medicare Other

## 2020-05-26 ENCOUNTER — Other Ambulatory Visit: Payer: Self-pay | Admitting: Family

## 2020-05-26 ENCOUNTER — Inpatient Hospital Stay: Payer: Medicare Other

## 2020-05-26 ENCOUNTER — Observation Stay (HOSPITAL_BASED_OUTPATIENT_CLINIC_OR_DEPARTMENT_OTHER)
Admission: EM | Admit: 2020-05-26 | Discharge: 2020-05-29 | Disposition: A | Discharge status: Home / Self Care | Payer: Medicare Other | Attending: Family Medicine | Admitting: Family Medicine

## 2020-05-26 ENCOUNTER — Telehealth: Payer: Self-pay | Admitting: *Deleted

## 2020-05-26 ENCOUNTER — Encounter (HOSPITAL_BASED_OUTPATIENT_CLINIC_OR_DEPARTMENT_OTHER): Payer: Self-pay

## 2020-05-26 ENCOUNTER — Other Ambulatory Visit (HOSPITAL_BASED_OUTPATIENT_CLINIC_OR_DEPARTMENT_OTHER): Payer: Self-pay

## 2020-05-26 DIAGNOSIS — Z96611 Presence of right artificial shoulder joint: Secondary | ICD-10-CM | POA: Diagnosis not present

## 2020-05-26 DIAGNOSIS — R42 Dizziness and giddiness: Secondary | ICD-10-CM | POA: Diagnosis not present

## 2020-05-26 DIAGNOSIS — D649 Anemia, unspecified: Secondary | ICD-10-CM | POA: Diagnosis present

## 2020-05-26 DIAGNOSIS — R0602 Shortness of breath: Secondary | ICD-10-CM | POA: Diagnosis not present

## 2020-05-26 DIAGNOSIS — Z96661 Presence of right artificial ankle joint: Secondary | ICD-10-CM | POA: Insufficient documentation

## 2020-05-26 DIAGNOSIS — Z96612 Presence of left artificial shoulder joint: Secondary | ICD-10-CM | POA: Insufficient documentation

## 2020-05-26 DIAGNOSIS — Z79899 Other long term (current) drug therapy: Secondary | ICD-10-CM | POA: Diagnosis not present

## 2020-05-26 DIAGNOSIS — I110 Hypertensive heart disease with heart failure: Secondary | ICD-10-CM | POA: Diagnosis not present

## 2020-05-26 DIAGNOSIS — I2510 Atherosclerotic heart disease of native coronary artery without angina pectoris: Secondary | ICD-10-CM | POA: Insufficient documentation

## 2020-05-26 DIAGNOSIS — I509 Heart failure, unspecified: Secondary | ICD-10-CM | POA: Insufficient documentation

## 2020-05-26 DIAGNOSIS — J449 Chronic obstructive pulmonary disease, unspecified: Secondary | ICD-10-CM | POA: Diagnosis present

## 2020-05-26 DIAGNOSIS — J189 Pneumonia, unspecified organism: Secondary | ICD-10-CM | POA: Diagnosis not present

## 2020-05-26 DIAGNOSIS — J9601 Acute respiratory failure with hypoxia: Principal | ICD-10-CM | POA: Insufficient documentation

## 2020-05-26 DIAGNOSIS — G894 Chronic pain syndrome: Secondary | ICD-10-CM | POA: Diagnosis present

## 2020-05-26 DIAGNOSIS — I10 Essential (primary) hypertension: Secondary | ICD-10-CM | POA: Diagnosis present

## 2020-05-26 DIAGNOSIS — J9811 Atelectasis: Secondary | ICD-10-CM | POA: Diagnosis not present

## 2020-05-26 DIAGNOSIS — Z85828 Personal history of other malignant neoplasm of skin: Secondary | ICD-10-CM | POA: Insufficient documentation

## 2020-05-26 DIAGNOSIS — E871 Hypo-osmolality and hyponatremia: Secondary | ICD-10-CM | POA: Diagnosis present

## 2020-05-26 DIAGNOSIS — Z20822 Contact with and (suspected) exposure to covid-19: Secondary | ICD-10-CM | POA: Insufficient documentation

## 2020-05-26 DIAGNOSIS — I2699 Other pulmonary embolism without acute cor pulmonale: Secondary | ICD-10-CM | POA: Diagnosis present

## 2020-05-26 LAB — FERRITIN: Ferritin: 210 ng/mL (ref 24–336)

## 2020-05-26 LAB — CBC WITH DIFFERENTIAL/PLATELET
Abs Immature Granulocytes: 0.16 10*3/uL — ABNORMAL HIGH (ref 0.00–0.07)
Basophils Absolute: 0 10*3/uL (ref 0.0–0.1)
Basophils Relative: 0 %
Eosinophils Absolute: 0 10*3/uL (ref 0.0–0.5)
Eosinophils Relative: 0 %
HCT: 29.5 % — ABNORMAL LOW (ref 39.0–52.0)
Hemoglobin: 9.7 g/dL — ABNORMAL LOW (ref 13.0–17.0)
Immature Granulocytes: 2 %
Lymphocytes Relative: 4 %
Lymphs Abs: 0.3 10*3/uL — ABNORMAL LOW (ref 0.7–4.0)
MCH: 31 pg (ref 26.0–34.0)
MCHC: 32.9 g/dL (ref 30.0–36.0)
MCV: 94.2 fL (ref 80.0–100.0)
Monocytes Absolute: 0.4 10*3/uL (ref 0.1–1.0)
Monocytes Relative: 5 %
Neutro Abs: 8.7 10*3/uL — ABNORMAL HIGH (ref 1.7–7.7)
Neutrophils Relative %: 89 %
Platelets: 356 10*3/uL (ref 150–400)
RBC: 3.13 MIL/uL — ABNORMAL LOW (ref 4.22–5.81)
RDW: 13.9 % (ref 11.5–15.5)
WBC: 9.6 10*3/uL (ref 4.0–10.5)
nRBC: 0 % (ref 0.0–0.2)

## 2020-05-26 LAB — URINALYSIS, ROUTINE W REFLEX MICROSCOPIC
Bilirubin Urine: NEGATIVE
Glucose, UA: NEGATIVE mg/dL
Hgb urine dipstick: NEGATIVE
Ketones, ur: NEGATIVE mg/dL
Leukocytes,Ua: NEGATIVE
Nitrite: NEGATIVE
Protein, ur: NEGATIVE mg/dL
Specific Gravity, Urine: 1.01 (ref 1.005–1.030)
pH: 5 (ref 5.0–8.0)

## 2020-05-26 LAB — COMPREHENSIVE METABOLIC PANEL
ALT: 10 U/L (ref 0–44)
AST: 14 U/L — ABNORMAL LOW (ref 15–41)
Albumin: 3.2 g/dL — ABNORMAL LOW (ref 3.5–5.0)
Alkaline Phosphatase: 46 U/L (ref 38–126)
Anion gap: 10 (ref 5–15)
BUN: 54 mg/dL — ABNORMAL HIGH (ref 8–23)
CO2: 26 mmol/L (ref 22–32)
Calcium: 7.7 mg/dL — ABNORMAL LOW (ref 8.9–10.3)
Chloride: 89 mmol/L — ABNORMAL LOW (ref 98–111)
Creatinine, Ser: 1.52 mg/dL — ABNORMAL HIGH (ref 0.61–1.24)
GFR, Estimated: 48 mL/min — ABNORMAL LOW (ref >60)
Glucose, Bld: 196 mg/dL — ABNORMAL HIGH (ref 70–99)
Potassium: 3.8 mmol/L (ref 3.5–5.1)
Sodium: 125 mmol/L — ABNORMAL LOW (ref 135–145)
Total Bilirubin: 2.1 mg/dL — ABNORMAL HIGH (ref 0.3–1.2)
Total Protein: 5.8 g/dL — ABNORMAL LOW (ref 6.5–8.1)

## 2020-05-26 LAB — IRON AND TIBC
Iron: 50 ug/dL (ref 42–163)
Saturation Ratios: 26 % (ref 20–55)
TIBC: 191 ug/dL — ABNORMAL LOW (ref 202–409)
UIBC: 141 ug/dL (ref 117–376)

## 2020-05-26 LAB — TROPONIN I (HIGH SENSITIVITY)
Troponin I (High Sensitivity): 11 ng/L (ref <18)
Troponin I (High Sensitivity): 10 ng/L (ref <18)

## 2020-05-26 LAB — SARS CORONAVIRUS 2 BY RT PCR (HOSPITAL ORDER, PERFORMED IN ~~LOC~~ HOSPITAL LAB): SARS Coronavirus 2: NEGATIVE

## 2020-05-26 LAB — OCCULT BLOOD X 1 CARD TO LAB, STOOL: Fecal Occult Bld: NEGATIVE

## 2020-05-26 MED ORDER — SODIUM CHLORIDE 0.9% FLUSH
10.0000 mL | INTRAVENOUS | Status: AC | PRN
  Filled 2020-05-26: qty 10

## 2020-05-26 MED ORDER — ALBUTEROL SULFATE (2.5 MG/3ML) 0.083% IN NEBU: 2.5000 mg | INHALATION_SOLUTION | Freq: Once | RESPIRATORY_TRACT | Status: DC

## 2020-05-26 MED ORDER — ALBUTEROL SULFATE HFA 108 (90 BASE) MCG/ACT IN AERS: 2.0000 | INHALATION_SPRAY | Freq: Once | RESPIRATORY_TRACT | Status: AC

## 2020-05-26 MED ORDER — ACETAMINOPHEN 325 MG PO TABS
ORAL_TABLET | ORAL | Status: AC
  Filled 2020-05-26: qty 2

## 2020-05-26 MED ORDER — SODIUM CHLORIDE 0.9% IV SOLUTION
250.0000 mL | Freq: Once | INTRAVENOUS | Status: AC
  Administered 2020-05-26: 250 mL via INTRAVENOUS
  Filled 2020-05-26: qty 250

## 2020-05-26 MED ORDER — DIPHENHYDRAMINE HCL 25 MG PO CAPS
25.0000 mg | ORAL_CAPSULE | Freq: Once | ORAL | Status: AC
  Administered 2020-05-26: 25 mg via ORAL

## 2020-05-26 MED ORDER — ALBUTEROL SULFATE HFA 108 (90 BASE) MCG/ACT IN AERS
INHALATION_SPRAY | RESPIRATORY_TRACT | Status: AC
  Administered 2020-05-26: 2 via RESPIRATORY_TRACT
  Filled 2020-05-26: qty 6.7

## 2020-05-26 MED ORDER — FUROSEMIDE 10 MG/ML IJ SOLN: 20.0000 mg | Freq: Once | INTRAMUSCULAR | Status: DC

## 2020-05-26 MED ORDER — HEPARIN SOD (PORK) LOCK FLUSH 100 UNIT/ML IV SOLN
250.0000 [IU] | INTRAVENOUS | Status: AC | PRN
  Filled 2020-05-26: qty 5

## 2020-05-26 MED ORDER — DIPHENHYDRAMINE HCL 25 MG PO CAPS
ORAL_CAPSULE | ORAL | Status: AC
  Filled 2020-05-26: qty 1

## 2020-05-26 MED ORDER — IOHEXOL 350 MG/ML SOLN
100.0000 mL | Freq: Once | INTRAVENOUS | Status: AC
  Administered 2020-05-26: 100 mL via INTRAVENOUS

## 2020-05-26 MED ORDER — SODIUM CHLORIDE 0.9 % IV BOLUS
1000.0000 mL | Freq: Once | INTRAVENOUS | Status: AC
  Administered 2020-05-26: 1000 mL via INTRAVENOUS

## 2020-05-26 MED ORDER — ACETAMINOPHEN 325 MG PO TABS
650.0000 mg | ORAL_TABLET | Freq: Once | ORAL | Status: AC
  Administered 2020-05-26: 650 mg via ORAL

## 2020-05-26 MED ORDER — ACETAMINOPHEN 325 MG PO TABS
650.0000 mg | ORAL_TABLET | Freq: Once | ORAL | Status: AC
  Administered 2020-05-26: 650 mg via ORAL
  Filled 2020-05-26: qty 2

## 2020-05-26 NOTE — Telephone Encounter (Addendum)
Called patient ride to let them know he would be in the ED downstairs for evaluation.  Also called patient daughter to let her know that her dad was in the ED and why we sent him down.  She was appreciative of the call.

## 2020-05-26 NOTE — Progress Notes (Unsigned)
1545-Pt transferred to room #2 in the ER via w/c with assist of two RN's.  Pt on O2@2L  Aristes and with blood infusing at 200 ml/hr.  Report given to [facility].

## 2020-05-26 NOTE — ED Provider Notes (Signed)
[facility] EMERGENCY DEPARTMENT Provider Note   CSN: [id number] Arrival date & time: 05/26/20  1307     History Chief Complaint  Patient presents with  . Abnormal Lab    [name] is a 75 y.o. male with a past medical history significant for hemochromatosis, iron deficiency anemia, hypertension, CAD, RA, A. fib, COPD, congestive heart failure, history of malignant melanoma of left knee who presents to the ED from Dr Loreli Slot office due to shortness of breath and abnormal labs. Discussed with Sarah NP at [facility] who saw patient just prior to arrival to the ED who notes patient was extremely pale and confused which is not his baseline. O2 saturation checked which was 74%. Patient placed on 2L [place] and returned to baseline mentation. Patient notes he had nausea, vomiting, and diarrhea over the past week that resolved 3 days ago. He currently resides at [facility] with his wife. While patient was experiencing his GI symptoms, he notes he felt dizzy which he describes as feeling off balance.  Patient had two falls within the past week.  He received a CT head yesterday which was negative for any acute abnormalities.  Patient states he has chronic shortness of breath however, prior to his blood transfusion today he became acutely short of breath which was when patient was found to be hypoxic.  Patient denies cough, sore throat, and nasal congestion.  He is currently vaccinated against COVID-19 including his booster shot.  Denies associated chest pain.  Spoke to patient's daughter, [name] who states that she was concerned that patient was having a stroke yesterday morning due to slurred speech that began around 7:30 AM yesterday.  [name] states that she spoke to her father 2 nights ago where he was experiencing normal speech however, yesterday morning she called him in which she believed his speech was slurred. She notes slurred speech improved throughout the day and has now  returned to baseline. Daughter denies unilateral weakness.  Patient was evaluated by Judson Roch, NP at the [facility] yesterday where routine labs were drawn and patient was found to have a hemoglobin of 8.7.  Patient denies hematochezia, melena, and hematemesis.  2 months ago patient's hemoglobin was 12.4 (baseline around 12).  He was also found to have a significant AKI yesterday with creatinine at 2.32 and BUN at 74.  He was given 1 L IV fluids and requested to return back to [facility].  Patient returned to the cancer center today for 1 unit of blood.  Discussed with Judson Roch who states concerns about possible infectious process and is requesting admission to the hospital for further evaluation. Per Judson Roch, Dr. Jonette Eva reviewed patient's blood smear today which is negative for any acute abnormalities. Patient was afebrile upon arrival to the cancer center today.   History obtained from patient, daughter, patient's doctor, and past medical records. No interpreter used during encounter.      Past Medical History:  Diagnosis Date  . Alcoholism /alcohol abuse    per family  . Allergic rhinitis   . Allergy   . Anxiety   . Bacterial infection   . CAD (coronary artery disease)    minimal coronary plaque in the LAD and right coronary system. 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 7-06. Last nuclear stress 11-17-06 fixed anterior/ inferior defect, no inducible ischemia, EF 81%  . Cataract   . CHF (congestive heart failure) ([place])   . Chronic back  pain    "all over back"  . Chronic neck pain   . COPD (chronic obstructive pulmonary disease) ([place])   . Depression   . Diverticulosis   . Dyspnea    with exertion  . Dysrhythmia 01-24-12   past hx. A.Fib x1 episode-responded to med.  . Falls frequently    "since 02/2013" (06/16/2013)  . Family history of cancer   . Genetic testing 03/06/2017   Multi-Cancer panel (83 genes) @ [facility] -  No pathogenic mutations detected  . GERD (gastroesophageal reflux disease)   . Hemochromatosis    dx'd 14 yrs ago last ferritin Aug 11, 08 52 (22-322), Fe 136 ("I had 250 phlebotomies for that")  . High cholesterol    hx  . Hx of colonic polyps   . Hx of colonoscopy   . Hypertension   . Iron deficiency anemia due to chronic blood loss 12/27/2016  . Malignant melanoma of knee, left ([place])   . Myocardial infarction [facility]) 2006   "related to catheterization"  . Narcotic abuse ([place])    per family  . Osteoarthritis   . PONV (postoperative nausea and vomiting)   . RA (rheumatoid arthritis) [facility])     Patient Active Problem List   Diagnosis Date Noted  . Acute respiratory failure with hypoxia ([place]) 05/26/2020  . Callus of foot 04/21/2018  . Edema 03/27/2018  . Toxic gastroenteritis and colitis 03/25/2018  . Other ulcerative 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 ([place]) 33/38/3291  . Diarrhea 12/23/2017  . Nausea & vomiting 12/23/2017  . Herpes zoster 07/07/2017  . Constipation 04/21/2017  . Genetic testing 03/06/2017  . Family history of cancer   . 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[place]) 02/12/2017  . Iron deficiency anemia due to chronic blood loss 12/27/2016  . Scalp wound 06/13/2016  . Scalp laceration, subsequent encounter 06/04/2016  . 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  . Pain in joint, ankle and foot 09/28/2015  . Anemia due to chronic blood loss 09/28/2015  . Hyponatremia 09/28/2015  . Cramp in limb 09/28/2015  . Exertional angina ([place]) 06/17/2014  . DOE (dyspnea on exertion) 04/28/2014  . Hypokalemia 03/11/2014  . Counseling regarding goals of care 12/31/2013  . Impaired glucose tolerance 12/31/2013  . 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[place]) 11/18/2013  . Fever 11/18/2013  . Leukocytosis 10/30/2013  . Fatigue 10/22/2013  . Hoarseness of voice 08/10/2013  . Sleep disorder 07/05/2013  . Tension headache 07/01/2013  . Polypharmacy 06/19/2013  . Lumbar post-laminectomy  syndrome 05/27/2013  . Degenerative arthritis of spine 05/26/2013  . Hemochromatosis 05/26/2013  . Wound infection after surgery 05/26/2013  . Normocytic anemia 05/26/2013  . Unintentional weight loss 05/26/2013  . Protein-calorie malnutrition, severe ([place]) 05/25/2013  . Chronic pain syndrome 05/23/2013  . COPD (chronic obstructive pulmonary disease) ([place]) 05/06/2013  . Frequent falls 05/06/2013  . Encounter for postoperative wound care 05/06/2013  . Atrial fibrillation ([place]) 08/07/2010  . Rheumatoid arthritis ([place]) 10/18/2008  . Anxiety 11/09/2007  . Elevated lipids 03/30/2007  . PSEUDOGOUT 03/30/2007  . Essential hypertension 03/30/2007  . Coronary atherosclerosis 03/30/2007    Past Surgical History:  Procedure Laterality Date  . ABDOMINAL ADHESION SURGERY  ~ 1968  . ANKLE RECONSTRUCTION Right 6-09   [facility]  . APPENDECTOMY  ~ 1956  . ASPIRATION OF ABSCESS Left 03/27/2017   Procedure: ASPIRATION OF SEROMA;  Surgeon: Stark Klein, MD;  Location: [facility];  Service: General;  Laterality: Left;  . BONE TUMOR RESECTION  ~ 1954   "taken off  my mastoid"  . CARPAL TUNNEL RELEASE Right 1990's  . CATARACT EXTRACTION, BILATERAL Bilateral 01-24-12  . CORONARY ANGIOPLASTY WITH STENT PLACEMENT  2006   "while repairing 1st stent, a second area tore and they had to place 2nd stent " ?LAD & CX  . CORONARY ANGIOPLASTY WITH STENT PLACEMENT  06/17/2014  . CYST EXCISION  "several OR's"   "backX 2, back of my neck, face, inside right bicept, chest, wrist"  . FOOT SURGERY Right 11-08   for removal of bone spurs-  . HAMMER TOE SURGERY Right 07/2012   "broke 4 hammertoes"   . HARDWARE REMOVAL  03/09/2012   Procedure: HARDWARE REMOVAL;  Surgeon: Nita Sells, MD;  Location: [facility];  Service: Orthopedics;  Laterality: Left;  Hardware Removal from Left Shoulder  . HARVEST BONE GRAFT  02/06/2012   Procedure: HARVEST ILIAC BONE GRAFT;  Surgeon: Nita Sells, MD;  Location: [facility];  Service: Orthopedics;;  bone marrow aspirqation   . INGUINAL HERNIA REPAIR Bilateral   . JOINT REPLACEMENT    . KNEE SURGERY Left ~ 2003   "6-12 months after uni knee removed synovial sack"  . LEFT HEART CATHETERIZATION WITH CORONARY ANGIOGRAM N/A 06/17/2014   PCI of diffuse severe stenosis in the proximal to mid LAD using overlapping drug-eluting stents.  . LUMBAR WOUND DEBRIDEMENT N/A 03/17/2013   Procedure: Incision and drainage of superficial lumbar wound;  Surgeon: Floyce Stakes, MD;  Location: [facility];  Service: Neurosurgery;  Laterality: N/A;  Incision and drainage of superficial lumbar wound  . MECKEL DIVERTICULUM EXCISION  ~ 1956  . MELANOMA EXCISION WITH SENTINEL LYMPH NODE BIOPSY Left 02/12/2017   WIDE LOCAL EXCISION LEFT KNEE MELANOMA, ADVANCEMENT FLAP CLOSURE, AND SENTINEL LYMPH NODE MAPPING AND BIOPSY.  [name] MELANOMA EXCISION WITH SENTINEL LYMPH NODE BIOPSY Left 02/12/2017   Procedure: WIDE LOCAL EXCISION LEFT KNEE MELANOMA, ADVANCEMENT FLAP CLOSURE, AND SENTINEL LYMPH NODE MAPPING AND BIOPSY.;  Surgeon: Stark Klein, MD;  Location: [facility];  Service: General;  Laterality: Left;  GENERAL AND LOCAL  . ORIF SHOULDER FRACTURE  02/06/2012   Procedure: OPEN REDUCTION INTERNAL FIXATION (ORIF) SHOULDER FRACTURE;  Surgeon: Nita Sells, MD;  Location: [facility];  Service: Orthopedics;  Laterality: Left;  ORIF of a Left Shoulder Fracture with  I[place]ac [place]rest Bone Graft aspiration   . PORTACATH PLACEMENT Left 03/27/2017   Procedure: INSERTION PORT-A-CATH;  Surgeon: Stark Klein, MD;  Location: [facility];  Service: General;  Laterality: Left;  . POSTERIOR LUMBAR FUSION  12-10   L4-5 diskectomy w/ fusion, cage placement and rods; [place]  . POSTERIOR LUMBAR FUSION 4 LEVEL N/A 03/02/2013   Procedure: Lumbar One to Sacral One Posterior lumbar interbody fusion;  Surgeon: Floyce Stakes, MD;  Location: [facility];  Service:  Neurosurgery;  Laterality: N/A;  L1 to S1 Posterior lumbar interbody fusion  . REPLACEMENT UNICONDYLAR JOINT KNEE Left ~ 2003   "~ 6 months after total knee replaced"  . SHOULDER ARTHROSCOPY Left ~ 2004 X 2   "@ [facility]; left bone splinter in & had to clean it out"  . TOTAL ANKLE REPLACEMENT Right 2008   at [facility]  . TOTAL KNEE ARTHROPLASTY Bilateral 2002  . TOTAL SHOULDER REPLACEMENT Left 2006  . TOTAL SHOULDER REPLACEMENT Right ~ 2007   Dr. Marlou Sa       Family History  Problem Relation Age of Onset  . Uterine cancer Mother  uterine vs. cervix? dx 59s; deceased 41  . Macular degeneration Mother   . Other Mother        ankle edema  . Lung cancer Mother   . Melanoma Mother        multiple spots on legs  . Cancer - Lung Mother        smoker  . Coronary artery disease Father   . Hypertension Father   . Prostate cancer Father   . Colon polyps Father   . Cancer - Prostate Father 43       deceased 80  . Heart attack Brother   . Hyperlipidemia Brother   . Other Brother        Schizophrenic  . Thyroid disease Sister   . Hemochromatosis Sister   . Rheum arthritis Sister   . Coronary artery disease Maternal Aunt   . Heart attack Maternal Aunt   . Cancer Maternal Grandmother        unk. type; deceased 12s  . Diabetes Neg Hx   . Colon cancer Neg Hx   . Esophageal cancer Neg Hx   . Rectal cancer Neg Hx   . Stomach cancer Neg Hx     Social History   Tobacco Use  . Smoking status: Never Smoker  . Smokeless tobacco: Never Used  Vaping Use  . Vaping Use: Never used  Substance Use Topics  . Alcohol use: No    Alcohol/week: 0.0 standard drinks    Comment: "I do not drink anymore"  . Drug use: No    Home Medications Prior to Admission medications   Medication Sig Start Date End Date Taking? Authorizing Provider  albuterol (PROVENTIL HFA;VENTOLIN HFA) 108 (90 Base) MCG/ACT inhaler Inhale 1-2 puffs into the lungs every 4 (four) hours as needed for wheezing or shortness of  breath. 09/11/16   Plotnikov, Evie Lacks, MD  ALPRAZolam Duanne Moron) 0.5 MG tablet Take 1 tablet (0.5 mg total) by mouth 3 (three) times daily as needed. for anxiety 02/07/20   Plotnikov, Evie Lacks, MD  Cholecalciferol (VITAMIN D3) 2000 UNITS capsule Take 1 capsule (2,000 Units total) by mouth daily. 12/05/14   Plotnikov, Evie Lacks, MD  clopidogrel (PLAVIX) 75 MG tablet TAKE 1 TABLET BY MOUTH EVERY DAY WITH BREAKFAST 03/15/20   Plotnikov, Evie Lacks, MD  FLUoxetine (PROZAC) 40 MG capsule Take 40 mg by mouth daily.    [provider]  fluticasone (FLONASE) 50 MCG/ACT nasal spray INSTILL 2 SPRAYS IN EACH NOSTRIL EVERY DAY 11/19/17   Plotnikov, Evie Lacks, MD  gabapentin (NEURONTIN) 300 MG capsule TAKE 1 CAPSULE(300 MG) BY MOUTH TWICE DAILY 04/26/20   Plotnikov, Evie Lacks, MD  hydrochlorothiazide (MICROZIDE) 12.5 MG capsule Take 1 capsule (12.5 mg total) by mouth daily. 03/21/20   Plotnikov, Evie Lacks, MD  HYDROmorphone (DILAUDID) 2 MG tablet Take 1-1.5 tablets (2-3 mg total) by mouth every 8 (eight) hours as needed for severe pain. 05/04/20   Plotnikov, Evie Lacks, MD  HYDROmorphone (DILAUDID) 2 MG tablet Take 1 tablet (2 mg total) by mouth every 8 (eight) hours as needed for severe pain. 05/04/20   Plotnikov, Evie Lacks, MD  lidocaine-prilocaine (EMLA) cream Apply 1 application topically as needed. 05/25/20   [place], Holli Humbles, NP  losartan (COZAAR) 50 MG tablet Take 1 tablet (50 mg total) by mouth daily. 03/21/20   Plotnikov, Evie Lacks, MD  Melatonin 5 MG TABS Take 5 mg by mouth at bedtime. Takes 10 mg at [facility]    [provider]  metoprolol tartrate (LOPRESSOR) 25 MG tablet TAKE 1 TABLET (25 MG TOTAL) BY MOUTH 2 (TWO) TIMES DAILY. 02/17/20   Plotnikov, Evie Lacks, MD  Omega-3 Fatty Acids (FISH OIL OMEGA-3) 1000 MG CAPS Take 1,000 mg by mouth daily.     [provider]  ondansetron (ZOFRAN) 8 MG tablet Take 1 tablet (8 mg total) by mouth 3 (three) times daily. 11/20/17   [name], MD   pantoprazole (PROTONIX) 40 MG tablet TAKE 1 TABLET (40 MG TOTAL) BY MOUTH EVERY EVENING. Patient taking differently: Take 40 mg by mouth every evening. Pt takes 1/2 a tablet daily 03/20/19   Plotnikov, Evie Lacks, MD  pravastatin (PRAVACHOL) 20 MG tablet Take 1 tablet (20 mg total) by mouth daily. 01/17/16   Plotnikov, Evie Lacks, MD  predniSONE (DELTASONE) 10 MG tablet 20 mg po qam pc or as directed 12/23/19   Plotnikov, Evie Lacks, MD  predniSONE (DELTASONE) 5 MG tablet TAKE 2 TABLETS (10 MG TOTAL) BY MOUTH DAILY WITH BREAKFAST. 02/17/20   Plotnikov, Evie Lacks, MD  prochlorperazine (COMPAZINE) 10 MG tablet TAKE 1 TABLET(10 MG) BY MOUTH EVERY 6 HOURS AS NEEDED FOR NAUSEA OR VOMITING 04/11/17   [name], MD  Pyridoxine HCl (B-6 PO) Take 1 tablet by mouth.     [provider]  tadalafil (CIALIS) 5 MG tablet Take 5 mg by mouth as needed.  12/09/15   [provider]  torsemide (DEMADEX) 10 MG tablet TAKE 1 TO 2 TABLETS(10 TO 20 MG) BY MOUTH DAILY 05/23/20   Plotnikov, Evie Lacks, MD  traZODone (DESYREL) 100 MG tablet TAKE 1 TABLET AT BEDTIME 04/09/20   Plotnikov, Evie Lacks, MD  vitamin B-12 (CYANOCOBALAMIN) 1000 MCG tablet Take 1,000 mcg by mouth daily.     [provider]    Allergies    Cefepime, Cephaeline, Morphine, Morphine and related, Penicillins, Doxycycline, and Oxycodone-acetaminophen  Review of Systems   Review of Systems  Constitutional: Negative for chills and fever.  Respiratory: Positive for shortness of breath.   Cardiovascular: Negative for chest pain and leg swelling.  Gastrointestinal: Positive for diarrhea (resolved), nausea (resolved) and vomiting (resolved). Negative for abdominal pain.  Neurological: Positive for dizziness (resolved).    Physical Exam Updated Vital Signs BP 118/83 (BP Location: Right Arm)   Pulse 69   Temp 98.1 F (36.7 C)   Resp 18   Ht 5' 6"  (1.676 m)   Wt 68.1 kg   SpO2 98%   BMI 24.23 kg/m   Physical Exam Vitals  and nursing note reviewed.  Constitutional:      General: He is not in acute distress.    Appearance: He is ill-appearing.     Comments: Chronically ill appearing pleasant 75 year old male.  HENT:     Head: Normocephalic.  Eyes:     Pupils: Pupils are equal, round, and reactive to light.  Cardiovascular:     Rate and Rhythm: Normal rate and regular rhythm.     Pulses: Normal pulses.     Heart sounds: Normal heart sounds. No murmur heard. No friction rub. No gallop.   Pulmonary:     Effort: Pulmonary effort is normal.     Breath sounds: Normal breath sounds.     Comments: On 2L [place] Abdominal:     General: Abdomen is flat. Bowel sounds are normal. There is no distension.     Palpations: Abdomen is soft.     Tenderness: There is no abdominal tenderness. There is no  guarding or rebound.  Musculoskeletal:     Cervical back: Neck supple.     Comments: No lower extremity edema.  Skin:    General: Skin is warm and dry.  Neurological:     General: No focal deficit present.     Mental Status: He is alert.     Comments: Bilateral upper extremity tremor Speech is clear, able to follow commands CN III-XII intact Normal strength in upper and lower extremities bilaterally including dorsiflexion and plantar flexion, strong and equal grip strength Sensation grossly intact throughout Moves extremities without ataxia, coordination intact No pronator drift  Psychiatric:        Mood and Affect: Mood normal.        Behavior: Behavior normal.     ED Results / Procedures / Treatments   Labs (all labs ordered are listed, but only abnormal results are displayed) Labs Reviewed  CBC WITH DIFFERENTIAL/PLATELET - Abnormal; Notable for the following components:      Result Value   RBC 3.13 (*)    Hemoglobin 9.7 (*)    HCT 29.5 (*)    Neutro Abs 8.7 (*)    Lymphs Abs 0.3 (*)    Abs Immature Granulocytes 0.16 (*)    All other components within normal limits  COMPREHENSIVE METABOLIC PANEL -  Abnormal; Notable for the following components:   Sodium 125 (*)    Chloride 89 (*)    Glucose, Bld 196 (*)    BUN 54 (*)    Creatinine, Ser 1.52 (*)    Calcium 7.7 (*)    Total Protein 5.8 (*)    Albumin 3.2 (*)    AST 14 (*)    Total Bilirubin 2.1 (*)    GFR, Estimated 48 (*)    All other components within normal limits  SARS CORONAVIRUS 2 (HOSPITAL ORDER, [place] [facility])  URINALYSIS, ROUTINE W REFLEX MICROSCOPIC  OCCULT BLOOD X 1 CARD TO LAB, STOOL  TROPONIN I (HIGH SENSITIVITY)  TROPONIN I (HIGH SENSITIVITY)    EKG None  Radiology CT HEAD WO CONTRAST  Result Date: 05/25/2020 CLINICAL DATA:  Dizziness, history of melanoma EXAM: CT HEAD WITHOUT CONTRAST TECHNIQUE: Contiguous axial images were obtained from the base of the skull through the vertex without intravenous contrast. COMPARISON:  2018 FINDINGS: Brain: There is no acute intracranial hemorrhage, mass effect, or edema. Gray-white differentiation is preserved. There is no extra-axial fluid collection. Prominence of the ventricles and sulci reflects mild generalized volume. Patchy hypoattenuation in the supratentorial white matter is nonspecific but probably reflects mild chronic microvascular ischemic changes. Vascular: There is atherosclerotic calcification at the skull base. Skull: Calvarium is unremarkable. Sinuses/Orbits: No acute finding. Other: None. IMPRESSION: No acute intracranial abnormality. Similar chronic microvascular ischemic changes. Electronically Signed   By: Macy Mis M.D.   On: 05/25/2020 10:33   CT Angio Chest PE W and/or Wo Contrast  Result Date: 05/26/2020 CLINICAL DATA:  Low hemoglobin confusion EXAM: CT ANGIOGRAPHY CHEST WITH CONTRAST TECHNIQUE: Multidetector CT imaging of the chest was performed using the standard protocol during bolus administration of intravenous contrast. Multiplanar CT image reconstructions and MIPs were obtained to evaluate the vascular anatomy. CONTRAST:   123m OMNIPAQUE IOHEXOL 350 MG/ML SOLN COMPARISON:  Chest x-ray 05/26/2020, CT 10/14/2017 FINDINGS: Cardiovascular: Satisfactory opacification of the pulmonary arteries to the segmental level. Small nonocclusive filling defect within subsegmental right upper lobe pulmonary vessel, series 8, image number 80. No other definitive filling defects are visualized. Nonaneurysmal aorta. Moderate aortic  atherosclerosis. Coronary vascular calcification. Normal cardiac size. No pericardial effusion. Left-sided central venous port with tip in the SVC. Mediastinum/Nodes: Midline trachea. No thyroid mass. No suspicious nodes. Esophagus within normal limits. Lungs/Pleura: No pleural effusion or pneumothorax. Chronic elevation right diaphragm. Partial consolidation right lower lobe slightly increased. Upper Abdomen: No acute abnormality. Incompletely visualized right hepatic cysts. Musculoskeletal: Degenerative changes. Anterolisthesis C7 on T1. Partially visualized lower spinal hardware. No acute osseous abnormality Review of the MIP images confirms the above findings. IMPRESSION: 1. Small nonocclusive filling defect within a subsegmental right upper lobe pulmonary vessel, consistent with small pulmonary embolus. 2. Chronic elevation right diaphragm with partial right lower lobe consolidation, some of which appears chronic. Acute process such as pneumonia superimposed on chronic atelectasis could be considered. 3. Aortic atherosclerosis. Aortic Atherosclerosis (ICD10-I70.0). Critical Value/emergent results were called by telephone at the time of interpretation on 05/26/2020 at 6:08 pm to provider [facility] , who verbally acknowledged these results. Electronically Signed   By: Donavan Foil M.D.   On: 05/26/2020 18:09   DG Chest Portable 1 View  Result Date: 05/26/2020 CLINICAL DATA:  Shortness of breath. EXAM: PORTABLE CHEST 1 VIEW COMPARISON:  PA and lateral chest 07/02/2017. FINDINGS: Lung volumes are low and there is  chronic elevation of the right hemidiaphragm. No consolidative process, pneumothorax or effusion. Heart size is upper normal. Bilateral shoulder replacements and spinal fusion hardware again seen. Port-A-Cath remains in place. IMPRESSION: No acute disease. Electronically Signed   By: Inge Rise M.D.   On: 05/26/2020 14:52    Procedures .Critical Care Performed by: Suzy Bouchard, PA-C Authorized by: Suzy Bouchard, PA-C   Critical care provider statement:    Critical care time (minutes):  40   Critical care time was exclusive of:  Separately billable procedures and treating other patients and teaching time   Critical care was necessary to treat or prevent imminent or life-threatening deterioration of the following conditions:  Respiratory failure and dehydration   Critical care was time spent personally by me on the following activities:  Discussions with consultants, evaluation of patient's response to treatment, examination of patient, ordering and performing treatments and interventions, ordering and review of laboratory studies, ordering and review of radiographic studies, pulse oximetry, re-evaluation of patient's condition, obtaining history from patient or surrogate and review of old charts   I assumed direction of critical care for this patient from another provider in my specialty: no     Care discussed with: admitting provider       Medications Ordered in ED Medications  sodium chloride 0.9 % bolus 1,000 mL (0 mLs Intravenous Stopped 05/26/20 2008)  iohexol (OMNIPAQUE) 350 MG/ML injection 100 mL (100 mLs Intravenous Contrast Given 05/26/20 1740)  albuterol (VENTOLIN HFA) 108 (90 Base) MCG/ACT inhaler 2 puff (2 puffs Inhalation Given 05/26/20 2012)  acetaminophen (TYLENOL) tablet 650 mg (650 mg Oral Given 05/26/20 2254)    ED Course  I have reviewed the triage vital signs and the nursing notes.  Pertinent labs & imaging results that were available during my care of  the patient were reviewed by me and considered in my medical decision making (see chart for details).  Clinical Course as of 05/26/20 2355  Fri May 26, 2020  1401 BP(!): 150/130 [CA]  1829 Fecal Occult Blood, POC: NEGATIVE [CA]    Clinical Course User Index [CA] Suzy Bouchard, PA-C   MDM Rules/Calculators/A&P  75 year old male presents to the ED from Dr. Loreli Slot office due to hypoxia, confusion, and anemia with hemoglobin of 8.7.  Patient was evaluated yesterday at the cancer center and found to have hemoglobin 8.7 and received 1 unit of blood today prior to arrival.  Patient denies hematemesis, hematochezia, and melena.  Fecal occult negative yesterday.  Patient recently had nausea, vomiting, diarrhea for a week which resolved 3 days ago.  Upon arrival, stable vitals.  Patient in no acute distress.  Patient appears chronically ill.  Physical exam reassuring.  Lungs clear to auscultation bilaterally.  No lower extremity edema.  No signs of fluid overload on exam. Low suspicion for CHF exacerbation. Normal neurological exam. Low suspicion for acute CVA. Patient states he has been compliant with all of his medications.  Routine labs ordered to compare hemoglobin and renal function from yesterday.  Patient had a severe AKI yesterday and received 1L IV fluids.  Spoke to Judson Roch the NP at the cancer center who expressed concerns about a possible infectious etiology.  UA ordered to rule out acute cystitis.  Chest x-ray to rule out pneumonia given hypoxia and shortness of breath.  Discussed case with Dr. Tomi Bamberger who evaluated patient at bedside and agrees with assessment and plan.  CBC significant for anemia at 9.7 which is a 1 g increase from yesterday status post 1 unit blood.  CMP significant for hyponatremia at 125, AKI with creatinine at 1.52 and BUN at 54 (improvement from yesterday), hyperglycemia 196 with no anion gap.  Doubt DKA.  Troponin normal at 11.  Low suspicion for  ACS given patient is asymptomatic. IVFs given. Covid negative. UA negative for signs of infection.  X-ray personally reviewed which is negative for signs of pneumonia, pneumothorax, or widened mediastinum. Fecal occult negative.  EKG personally reviewed which demonstrates normal sinus rhythm with no significant changes from previous EKG.  CTA personally reviewed which demonstrates: IMPRESSION:  1. Small nonocclusive filling defect within a subsegmental right  upper lobe pulmonary vessel, consistent with small pulmonary  embolus.  2. Chronic elevation right diaphragm with partial right lower lobe  consolidation, some of which appears chronic. Acute process such as  pneumonia superimposed on chronic atelectasis could be considered.  3. Aortic atherosclerosis.   Discussed case with radiologist who does not feel PE is large enough for patient to be symptomatic.  Will discuss with hospitalist about treatment for pneumonia or PE.  Hypoxia could be related to symptomatic anemia.  7:15 PM discussed case with Dr. Posey Pronto with [facility] hospital[facility] who agrees to admit patient for further evaluation.  Will hold on anticoagulants and antibiotics per Dr. Posey Pronto.  Incentive spirometry and breathing treatment ordered.  Final Clinical Impression(s) / ED Diagnoses Final diagnoses:  Symptomatic anemia  Acute respiratory failure with hypoxia [facility])    Rx / DC Orders ED Discharge Orders    None       Karie Kirks 05/26/20 2355    Dorie Rank, MD 05/28/20 [phone]

## 2020-05-26 NOTE — ED Notes (Addendum)
Assumed care of this patient. Vitals taken. A&Ox4. NAD. Respirations regular/unlabored. Pt on 2L [place]. Pt denies pain at this time. Pt able to speak full sentences. Stretcher low, wheels locked, call bell within reach. Connected to cardiac monitor, BP, pulse ox. Will continue to monitor.

## 2020-05-26 NOTE — ED Triage Notes (Signed)
Pt from Dr Loreli Slot office  Upstairs and sent down for evaluation for abnormal lab Hgb 8.5 and confusion. Pt arrived with blood infusing thru his port in Left chest. Pt from [place]

## 2020-05-26 NOTE — ED Notes (Signed)
Per PA, turned pt o2 off to see if O2 sats drop.

## 2020-05-26 NOTE — ED Notes (Signed)
CT advises they are unable to use the port unless an injectable needle is attached.

## 2020-05-26 NOTE — Progress Notes (Signed)
Patient presented this morning for blood and was confused and hypoxic on arrival. After placing patient on 2L [place] his condition improved and he was able to answer questions appropriately. He is weak, fatigued, SOB with any exertion and has "dry mouth". I spoke with Dr. Marin Olp who also talked with the patient. He is agreeable to going to the ED after getting blood. He received the 1 unit and was then transfered to the ED for further work up and preparation for hospital admission. Report was called to charge nurse Sam, I also updated Hoyle Sauer, NP who is assessing the patient.

## 2020-05-26 NOTE — Patient Instructions (Signed)

## 2020-05-26 NOTE — ED Notes (Signed)
Pt arrives from cancer center with red blood cells running via IV to port on left chest. Pt states he has been feeling confused at times and has recently suffered 2 falls. Pt alert & oriented upon arrival. Per cancer center, pt had big drop in hemoglobin from yesterday to today, and had to be placed on 2L O2 via [place] because his O2 sat was in the 70's.

## 2020-05-26 NOTE — ED Notes (Signed)
Pt finished one unit of blood; 315 ml; unit number [id number]

## 2020-05-26 NOTE — ED Notes (Signed)
Attempted to call report on patient. Room [place]. Awaited nurse answer and phone disconnected.

## 2020-05-26 NOTE — ED Notes (Signed)
Patient transported to CT 

## 2020-05-27 ENCOUNTER — Observation Stay (HOSPITAL_BASED_OUTPATIENT_CLINIC_OR_DEPARTMENT_OTHER): Payer: Medicare Other

## 2020-05-27 DIAGNOSIS — Z85828 Personal history of other malignant neoplasm of skin: Secondary | ICD-10-CM | POA: Diagnosis not present

## 2020-05-27 DIAGNOSIS — I509 Heart failure, unspecified: Secondary | ICD-10-CM | POA: Diagnosis not present

## 2020-05-27 DIAGNOSIS — I82403 Acute embolism and thrombosis of unspecified deep veins of lower extremity, bilateral: Secondary | ICD-10-CM

## 2020-05-27 DIAGNOSIS — Z85820 Personal history of malignant melanoma of skin: Secondary | ICD-10-CM

## 2020-05-27 DIAGNOSIS — I2699 Other pulmonary embolism without acute cor pulmonale: Secondary | ICD-10-CM

## 2020-05-27 DIAGNOSIS — Z7901 Long term (current) use of anticoagulants: Secondary | ICD-10-CM

## 2020-05-27 DIAGNOSIS — I110 Hypertensive heart disease with heart failure: Secondary | ICD-10-CM | POA: Diagnosis not present

## 2020-05-27 DIAGNOSIS — Z96661 Presence of right artificial ankle joint: Secondary | ICD-10-CM | POA: Diagnosis not present

## 2020-05-27 DIAGNOSIS — Z79899 Other long term (current) drug therapy: Secondary | ICD-10-CM | POA: Diagnosis not present

## 2020-05-27 DIAGNOSIS — J449 Chronic obstructive pulmonary disease, unspecified: Secondary | ICD-10-CM | POA: Diagnosis not present

## 2020-05-27 DIAGNOSIS — D649 Anemia, unspecified: Secondary | ICD-10-CM | POA: Diagnosis present

## 2020-05-27 DIAGNOSIS — J9601 Acute respiratory failure with hypoxia: Secondary | ICD-10-CM | POA: Diagnosis not present

## 2020-05-27 DIAGNOSIS — Z96612 Presence of left artificial shoulder joint: Secondary | ICD-10-CM | POA: Diagnosis not present

## 2020-05-27 DIAGNOSIS — Z96611 Presence of right artificial shoulder joint: Secondary | ICD-10-CM | POA: Diagnosis not present

## 2020-05-27 DIAGNOSIS — R0602 Shortness of breath: Secondary | ICD-10-CM | POA: Diagnosis present

## 2020-05-27 DIAGNOSIS — Z20822 Contact with and (suspected) exposure to covid-19: Secondary | ICD-10-CM | POA: Diagnosis not present

## 2020-05-27 DIAGNOSIS — I2510 Atherosclerotic heart disease of native coronary artery without angina pectoris: Secondary | ICD-10-CM | POA: Diagnosis not present

## 2020-05-27 LAB — CBC
HCT: 29.3 % — ABNORMAL LOW (ref 39.0–52.0)
Hemoglobin: 9.4 g/dL — ABNORMAL LOW (ref 13.0–17.0)
MCH: 31 pg (ref 26.0–34.0)
MCHC: 32.1 g/dL (ref 30.0–36.0)
MCV: 96.7 fL (ref 80.0–100.0)
Platelets: 348 10*3/uL (ref 150–400)
RBC: 3.03 MIL/uL — ABNORMAL LOW (ref 4.22–5.81)
RDW: 14.1 % (ref 11.5–15.5)
WBC: 6.7 10*3/uL (ref 4.0–10.5)
nRBC: 0 % (ref 0.0–0.2)

## 2020-05-27 LAB — TYPE AND SCREEN
ABO/RH(D): A NEG
Antibody Screen: NEGATIVE
Unit division: 0

## 2020-05-27 LAB — BPAM RBC
Blood Product Expiration Date: 202203122359
ISSUE DATE / TIME: 202202180736
Unit Type and Rh: 600

## 2020-05-27 LAB — IRON AND TIBC
Iron: 81 ug/dL (ref 45–182)
Saturation Ratios: 43 % — ABNORMAL HIGH (ref 17.9–39.5)
TIBC: 187 ug/dL — ABNORMAL LOW (ref 250–450)
UIBC: 106 ug/dL

## 2020-05-27 LAB — BASIC METABOLIC PANEL
Anion gap: 8 (ref 5–15)
BUN: 33 mg/dL — ABNORMAL HIGH (ref 8–23)
CO2: 30 mmol/L (ref 22–32)
Calcium: 7.5 mg/dL — ABNORMAL LOW (ref 8.9–10.3)
Chloride: 96 mmol/L — ABNORMAL LOW (ref 98–111)
Creatinine, Ser: 0.93 mg/dL (ref 0.61–1.24)
GFR, Estimated: >60 mL/min (ref >60)
Glucose, Bld: 87 mg/dL (ref 70–99)
Potassium: 3.1 mmol/L — ABNORMAL LOW (ref 3.5–5.1)
Sodium: 134 mmol/L — ABNORMAL LOW (ref 135–145)

## 2020-05-27 MED ORDER — METOPROLOL TARTRATE 25 MG PO TABS
25.0000 mg | ORAL_TABLET | Freq: Two times a day (BID) | ORAL | Status: DC
Start: 2020-05-27 — End: 2020-05-29
  Administered 2020-05-27 – 2020-05-29 (×5): 25 mg via ORAL
  Filled 2020-05-27 (×5): qty 1

## 2020-05-27 MED ORDER — FLUOXETINE HCL 20 MG PO CAPS
40.0000 mg | ORAL_CAPSULE | Freq: Every day | ORAL | Status: DC
  Administered 2020-05-27 – 2020-05-29 (×3): 40 mg via ORAL
  Filled 2020-05-27 (×3): qty 2

## 2020-05-27 MED ORDER — PRAVASTATIN SODIUM 20 MG PO TABS
20.0000 mg | ORAL_TABLET | Freq: Every day | ORAL | Status: DC
  Administered 2020-05-27 – 2020-05-29 (×3): 20 mg via ORAL
  Filled 2020-05-27 (×3): qty 1

## 2020-05-27 MED ORDER — ALBUTEROL SULFATE HFA 108 (90 BASE) MCG/ACT IN AERS
1.0000 | INHALATION_SPRAY | RESPIRATORY_TRACT | Status: DC | PRN
  Filled 2020-05-27: qty 6.7

## 2020-05-27 MED ORDER — APIXABAN 5 MG PO TABS
10.0000 mg | ORAL_TABLET | Freq: Two times a day (BID) | ORAL | Status: DC
  Administered 2020-05-27 – 2020-05-28 (×3): 10 mg via ORAL
  Filled 2020-05-27 (×3): qty 2

## 2020-05-27 MED ORDER — SODIUM CHLORIDE 0.9 % IV SOLN: INTRAVENOUS | Status: AC

## 2020-05-27 MED ORDER — ONDANSETRON HCL 4 MG/2ML IJ SOLN: 4.0000 mg | Freq: Four times a day (QID) | INTRAMUSCULAR | Status: DC | PRN

## 2020-05-27 MED ORDER — APIXABAN 5 MG PO TABS: 5.0000 mg | ORAL_TABLET | Freq: Two times a day (BID) | ORAL | Status: DC

## 2020-05-27 MED ORDER — SODIUM CHLORIDE 0.9% FLUSH: 10.0000 mL | INTRAVENOUS | Status: DC | PRN

## 2020-05-27 MED ORDER — ONDANSETRON HCL 4 MG PO TABS
4.0000 mg | ORAL_TABLET | Freq: Four times a day (QID) | ORAL | Status: DC | PRN
  Administered 2020-05-28: 4 mg via ORAL
  Filled 2020-05-27: qty 1

## 2020-05-27 MED ORDER — PREDNISONE 20 MG PO TABS
20.0000 mg | ORAL_TABLET | Freq: Every day | ORAL | Status: DC
  Administered 2020-05-27 – 2020-05-29 (×3): 20 mg via ORAL
  Filled 2020-05-27 (×3): qty 1

## 2020-05-27 MED ORDER — ACETAMINOPHEN 325 MG PO TABS
650.0000 mg | ORAL_TABLET | Freq: Four times a day (QID) | ORAL | Status: DC | PRN
  Administered 2020-05-29: 650 mg via ORAL
  Filled 2020-05-27: qty 2

## 2020-05-27 MED ORDER — POLYVINYL ALCOHOL 1.4 % OP SOLN
1.0000 [drp] | OPHTHALMIC | Status: DC | PRN
  Filled 2020-05-27: qty 15

## 2020-05-27 MED ORDER — ACETAMINOPHEN 650 MG RE SUPP: 650.0000 mg | Freq: Four times a day (QID) | RECTAL | Status: DC | PRN

## 2020-05-27 MED ORDER — CHLORHEXIDINE GLUCONATE CLOTH 2 % EX PADS
6.0000 | MEDICATED_PAD | Freq: Every day | CUTANEOUS | Status: DC
  Administered 2020-05-27 – 2020-05-28 (×2): 6 via TOPICAL

## 2020-05-27 MED ORDER — ALPRAZOLAM 0.5 MG PO TABS
0.5000 mg | ORAL_TABLET | Freq: Three times a day (TID) | ORAL | Status: DC | PRN
  Administered 2020-05-27 – 2020-05-28 (×4): 0.5 mg via ORAL
  Filled 2020-05-27 (×4): qty 1

## 2020-05-27 MED ORDER — GABAPENTIN 300 MG PO CAPS
300.0000 mg | ORAL_CAPSULE | Freq: Two times a day (BID) | ORAL | Status: DC
  Administered 2020-05-27 – 2020-05-29 (×5): 300 mg via ORAL
  Filled 2020-05-27 (×5): qty 1

## 2020-05-27 MED ORDER — HYDROMORPHONE HCL 2 MG PO TABS
2.0000 mg | ORAL_TABLET | Freq: Three times a day (TID) | ORAL | Status: DC | PRN
  Administered 2020-05-27: 2 mg via ORAL
  Administered 2020-05-27: 3 mg via ORAL
  Administered 2020-05-27: 2 mg via ORAL
  Administered 2020-05-28: 3 mg via ORAL
  Administered 2020-05-29: 2 mg via ORAL
  Filled 2020-05-27 (×3): qty 2
  Filled 2020-05-27 (×2): qty 1

## 2020-05-27 NOTE — Progress Notes (Signed)
ANTICOAGULATION CONSULT NOTE   Pharmacy Consult for Eliquis Indication: acute pulmonary embolus  Allergies  Allergen Reactions  . Cefepime Hives and Shortness Of Breath  . Cephaeline Hives  . Morphine Swelling    A swollen stomach.  . Morphine And Related Shortness Of Breath, Nausea And Vomiting, Swelling and Other (See Comments)    Agitation, tolerates dilaudid Other reaction(s): Other (See Comments) Agitation, tolerates dilaudid  . Penicillins Hives, Shortness Of Breath and Rash    Has patient had a PCN reaction causing immediate rash, facial/tongue/throat swelling, SOB or lightheadedness with hypotension: Yes Has patient had a PCN reaction causing severe rash involving mucus membranes or skin necrosis: Yes Has patient had a PCN reaction that required hospitalization No Has patient had a PCN reaction occurring within the last 10 years: No If all of the above answers are "NO", then may proceed with Cephalosporin use.   [name] Doxycycline Rash  . Oxycodone-Acetaminophen Other (See Comments)    Patient doesn't remember what type of reaction.    Patient Measurements: Height: 5' 6"  (167.6 cm) Weight: 68.1 kg (150 lb 2.1 oz) IBW/kg (Calculated) : 63.8 Heparin Dosing Weight: 68 kg  Vital Signs: Temp: 98.6 F (37 C) (02/19 0405) Temp Source: Oral (02/18 2028) BP: 98/52 (02/19 0405) Pulse Rate: 77 (02/19 0405)  Labs: Recent Labs    05/25/20 0912 05/26/20 1438 05/26/20 1714  HGB 8.7* 9.7*  --   HCT 26.6* 29.5*  --   PLT 363 356  --   CREATININE 2.32* 1.52*  --   TROPONINIHS  --  11 10    Estimated Creatinine Clearance: 39.1 mL/min (A) (by C-G formula based on SCr of 1.52 mg/dL (H)).  Assessment: Patient is a 75 y.o M with hx hemochromatosis, melanoma of the left thigh, and iron deficiency anemia presented to the ED on 2/18 with c/o SOB.  Chest CTA on 2/18 showed small PE in RLL.  Pharmacy has been consulted to start Eliquis for VTE.  Today, 05/27/2020: - hgb 9.7, plts  356k - scr 1.52 - hemeoccult neg   Plan:  -Eliquis 33m bid x7 days, then 520mbid - monitor for s/sx bleeding - f/u with LE doppler  Pham, Anh P 05/27/2020,8:12 AM

## 2020-05-27 NOTE — Progress Notes (Signed)
PROGRESS NOTE    Patient: [name]                            PCP: Cassandria Anger, MD                    DOB: 1945-08-12            DOA: 05/26/2020 [id number]             DOS: 05/27/2020, 11:30 AM   LOS: 0 days   Date of Service: The patient was seen and examined on 05/27/2020  Subjective:   The patient was seen and examined this morning. Stable at this time. No complaints this morning, with exception of generalized weaknesses, Otherwise no issues overnight .  Brief Narrative:   [name] is a 75 y.o. 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  Patient initially seen in oncology office 05/25/2020 and noted to have drop in hemoglobin from baseline 12 down to 8.7.  He did not have any obvious bleeding.  Labs also showed new AKI with creatinine 2.32 compared to normal renal function.   He came to the [facility] on 05/26/2020 to receive 2 unit PRBC transfusion.   On arrival he was noted to be hypoxic with SPO2 in the high 70s.  He became pale and diaphoretic and had a drop in blood pressure.  He was placed on 2 L O2 via [place] and taken down to the [place] ED while blood was transfusing through his Port-A-Cath.   [facility] ED Course:  Initial vitals showed BP 93/60, pulse 95, RR 18, temp 98.5 F, SPO2 93% on room air.  Labs notable for hemoglobin improved to 9.7, creatinine improving to 1.52, sodium 125 near recent baseline,BUN 54, high-sensitivity troponin I negative x2.  FOBT negative.  SARS-CoV-2 PCR is negative.  Portable chest x-ray shows Port-A-Cath in place without acute disease.   Chronic elevation of right hemidiaphragm noted.  CTA chest  PE study shows a small nonocclusive subsegmental right upper lobe PE.   Chronic elevation right hemidiaphragm with partial right lower lobe consolidation seen which appears chronic.  Patient was given 1 L normal saline, albuterol inhaler, and Tylenol.    Assessment & Plan:   Principal Problem:   Acute respiratory failure with hypoxia (HCC) Active Problems:   Essential hypertension   Coronary atherosclerosis   COPD (chronic obstructive pulmonary disease) (HCC)   Chronic pain syndrome   Hyponatremia   Symptomatic anemia   Pulmonary embolus (HCC)    Acute respiratory failure with hypoxia: -Much improved -wean off supplemental oxygen currently satting 94% on room air -Likely multifactorial, anemia, atelectasis, small subsegmental PE on CTA -Incentive spirometer, albuterol as needed -Patient is oncology has started him on Eliquis today 05/27/2020  Symptomatic anemia: S/p 2 unit PRBC transfusion prior to transfer to inpatient bed.  Hemoglobin improving.  Hemoglobin office and in ED negative -He has been on Plavix, he was started on Eliquis today -H&H remained stable  Small subsegmental right upper lobe PE /bilateral DVT Seen on CTA as above.   -Discussed patient's oncologist.  Dr. Marin Olp has started the patient on Eliquis  AKI: -Improved 2.32 >> 1.52 >> 0.93 Likely primarily due to GI losses from recent gastroenteritis.    Status post gentle IV fluids  -Holding home HCTZ, losartan, torsemide.  CAD s/p PCI: Chronic and stable, denies any chest pain.  Plavix currently on hold.  COPD: Chronic without evidence of acute exacerbation.  Continue albuterol as needed.  Hypertension -hypotensive Resume home Lopressor, -We will continue to hold, anticipating discontinuing  HCTZ and losartan.  Hyperlipidemia: Continue statin.  Chronic hyponatremia: -Exacerbated by dehydration, sodium 126, 125, 134 today -Responded to IV fluid hydration  -Continue to hold home HCTZ  and torsemide.  Anxiety: Continue home Xanax as needed with hold parameters.  Chronic back pain: Continue home Dilaudid with hold parameters in prednisone 20 mg daily.     DVT prophylaxis: SCDs only for now Code Status: DNR, confirmed with patient Family Communication: Discussed with patient Disposition Plan: From [facility] Consults called: None  Admission status:  Status is: Observation Level of care: Telemetry  The patient remains OBS appropriate and will d/c before 2 midnights. Patient will remain in hospital due to generalized weaknesses, confusion, initiation of anticoagulation in the setting of anemia--- monitor H&H, for next 24 hours   Dispo: The patient is from: [facility]  Anticipated d/c is to: Previous environment  Anticipated d/c date is: 1 day  Patient currently is not medically stable to d/c.              Difficult to place patient No   --------------------------------------------------------------------------------------------------------------------------------- Nutritional status:  The patient's BMI is: Body mass index is 24.23 kg/m. I agree with the assessment and plan as outlined ---     Procedures:   No admission procedures for hospital encounter.     Antimicrobials:  Anti-infectives (From admission, onward)   None       Medication:  . apixaban  10 mg Oral BID   Followed by  . [START ON 06/03/2020] apixaban  5 mg Oral BID  . Chlorhexidine Gluconate Cloth  6 each Topical Daily  . FLUoxetine  40 mg Oral Daily  . gabapentin  300 mg Oral BID  . metoprolol tartrate  25 mg Oral BID  . pravastatin  20 mg Oral Daily  . predniSONE  20 mg Oral Q breakfast    acetaminophen **OR** acetaminophen, albuterol, ALPRAZolam, HYDROmorphone, ondansetron **OR** ondansetron (ZOFRAN) IV, polyvinyl alcohol, sodium chloride flush   Objective:   Vitals:   05/26/20 2028 05/26/20 2343  05/27/20 0405 05/27/20 0916  BP: (!) 109/57 118/83 (!) 98/52 102/67  Pulse: 68 69 77 74  Resp: 20 18 18 18   Temp: 98.1 F (36.7 C) 98.1 F (36.7 C) 98.6 F (37 C) 98 F (36.7 C)  TempSrc: Oral   Oral  SpO2: 94% 98% 90% 94%  Weight:      Height:        Intake/Output Summary (Last 24 hours) at 05/27/2020 1130 Last data filed at 05/27/2020 0900 Gross per 24 hour  Intake 1001.45 ml  Output 500 ml  Net 501.45 ml   Filed Weights   05/26/20 1348  Weight: 68.1 kg     Examination:   Physical Exam  Constitution:  Alert, cooperative, no distress,  Appears calm and comfortable  Psychiatric: Normal and stable mood and affect, cognition intact,   HEENT: Normocephalic, PERRL, otherwise with in Normal limits  Chest:Chest symmetric Cardio vascular:  S1/S2, RRR, No murmure, No Rubs or Gallops  pulmonary: Clear to auscultation bilaterally, respirations unlabored, negative  wheezes / crackles Abdomen: Soft, non-tender, non-distended, bowel sounds,no masses, no organomegaly Muscular skeletal: Limited exam - in bed, able to move all 4 extremities, Normal strength,  Neuro: CNII-XII intact. , normal motor and sensation, reflexes intact  Extremities: No pitting edema lower extremities, +2 pulses  Skin: Dry, warm to touch, negative for any Rashes, No open wounds Wounds: per nursing documentation    ------------------------------------------------------------------------------------------------------------------------------------------    LABs:  CBC Latest Ref Rng & Units 05/27/2020 05/26/2020 05/25/2020  WBC 4.0 - 10.5 K/uL 6.7 9.6 9.3  Hemoglobin 13.0 - 17.0 g/dL 9.4(L) 9.7(L) 8.7(L)  Hematocrit 39.0 - 52.0 % 29.3(L) 29.5(L) 26.6(L)  Platelets 150 - 400 K/uL 348 356 363   CMP Latest Ref Rng & Units 05/27/2020 05/26/2020 05/25/2020  Glucose 70 - 99 mg/dL 87 196(H) 94  BUN 8 - 23 mg/dL 33(H) 54(H) 74(H)  Creatinine 0.61 - 1.24 mg/dL 0.93 1.52(H) 2.32(H)  Sodium 135 - 145 mmol/L 134(L) 125(L)  126(L)  Potassium 3.5 - 5.1 mmol/L 3.1(L) 3.8 3.6  Chloride 98 - 111 mmol/L 96(L) 89(L) 87(L)  CO2 22 - 32 mmol/L 30 26 32  Calcium 8.9 - 10.3 mg/dL 7.5(L) 7.7(L) 8.2(L)  Total Protein 6.5 - 8.1 g/dL - 5.8(L) 5.9(L)  Total Bilirubin 0.3 - 1.2 mg/dL - 2.1(H) 1.0  Alkaline Phos 38 - 126 U/L - 46 41  AST 15 - 41 U/L - 14(L) 11(L)  ALT 0 - 44 U/L - 10 8       Micro Results Recent Results (from the past 240 hour(s))  SARS Coronavirus 2 by RT PCR (hospital order, performed in [place] hospital lab)     Status: None   Collection Time: 05/26/20  2:38 PM  Result Value Ref Range Status   SARS Coronavirus 2 NEGATIVE NEGATIVE Final    Comment: (NOTE) SARS-CoV-2 target nucleic acids are NOT DETECTED.  The SARS-CoV-2 RNA is generally detectable in upper and lower respiratory specimens during the acute phase of infection. The lowest concentration of SARS-CoV-2 viral copies this assay can detect is 250 copies / mL. A negative result does not preclude SARS-CoV-2 infection and should not be used as the sole basis for treatment or other patient management decisions.  A negative result may occur with improper specimen collection / handling, submission of specimen other than nasopharyngeal swab, presence of viral mutation(s) within the areas targeted by this assay, and inadequate number of viral copies (<250 copies / mL). A negative result must be combined with clinical observations, patient history, and epidemiological information.  Fact Sheet for Patients:   StrictlyIdeas.no  Fact Sheet for Healthcare Providers: BankingDealers.co.za  This test is not yet approved or  cleared by the [place] FDA and has been authorized for detection and/or diagnosis of SARS-CoV-2 by FDA under an Emergency Use Authorization (EUA).  This EUA will remain in effect (meaning this test can be used) for the duration of the COVID-19 declaration under Section  564(b)(1) of the Act, 21 U.S.C. section 360bbb-3(b)(1), unless the authorization is terminated or revoked sooner.  Performed at [facility], [address] [place], [place] [address]     Radiology Reports CT HEAD WO CONTRAST  Result Date: 05/25/2020 CLINICAL DATA:  Dizziness, history of melanoma EXAM: CT HEAD WITHOUT CONTRAST TECHNIQUE: Contiguous axial images were obtained from the base of the skull through the vertex without intravenous contrast. COMPARISON:  2018 FINDINGS: Brain: There is no acute intracranial hemorrhage, mass effect, or edema. Gray-white differentiation is preserved. There is  no extra-axial fluid collection. Prominence of the ventricles and sulci reflects mild generalized volume. Patchy hypoattenuation in the supratentorial white matter is nonspecific but probably reflects mild chronic microvascular ischemic changes. Vascular: There is atherosclerotic calcification at the skull base. Skull: Calvarium is unremarkable. Sinuses/Orbits: No acute finding. Other: None. IMPRESSION: No acute intracranial abnormality. Similar chronic microvascular ischemic changes. Electronically Signed   By: Macy Mis M.D.   On: 05/25/2020 10:33   CT Angio Chest PE W and/or Wo Contrast  Result Date: 05/26/2020 CLINICAL DATA:  Low hemoglobin confusion EXAM: CT ANGIOGRAPHY CHEST WITH CONTRAST TECHNIQUE: Multidetector CT imaging of the chest was performed using the standard protocol during bolus administration of intravenous contrast. Multiplanar CT image reconstructions and MIPs were obtained to evaluate the vascular anatomy. CONTRAST:  161m OMNIPAQUE IOHEXOL 350 MG/ML SOLN COMPARISON:  Chest x-ray 05/26/2020, CT 10/14/2017 FINDINGS: Cardiovascular: Satisfactory opacification of the pulmonary arteries to the segmental level. Small nonocclusive filling defect within subsegmental right upper lobe pulmonary vessel, series 8, image number 80. No other definitive filling defects are  visualized. Nonaneurysmal aorta. Moderate aortic atherosclerosis. Coronary vascular calcification. Normal cardiac size. No pericardial effusion. Left-sided central venous port with tip in the SVC. Mediastinum/Nodes: Midline trachea. No thyroid mass. No suspicious nodes. Esophagus within normal limits. Lungs/Pleura: No pleural effusion or pneumothorax. Chronic elevation right diaphragm. Partial consolidation right lower lobe slightly increased. Upper Abdomen: No acute abnormality. Incompletely visualized right hepatic cysts. Musculoskeletal: Degenerative changes. Anterolisthesis C7 on T1. Partially visualized lower spinal hardware. No acute osseous abnormality Review of the MIP images confirms the above findings. IMPRESSION: 1. Small nonocclusive filling defect within a subsegmental right upper lobe pulmonary vessel, consistent with small pulmonary embolus. 2. Chronic elevation right diaphragm with partial right lower lobe consolidation, some of which appears chronic. Acute process such as pneumonia superimposed on chronic atelectasis could be considered. 3. Aortic atherosclerosis. Aortic Atherosclerosis (ICD10-I70.0). Critical Value/emergent results were called by telephone at the time of interpretation on 05/26/2020 at 6:08 pm [facility] who verbally acknowledged these results. Electronically Signed   By: KDonavan FoilM.D.   On: 05/26/2020 18:09   DG Chest Portable 1 View  Result Date: 05/26/2020 CLINICAL DATA:  Shortness of breath. EXAM: PORTABLE CHEST 1 VIEW COMPARISON:  PA and lateral chest 07/02/2017. FINDINGS: Lung volumes are low and there is chronic elevation of the right hemidiaphragm. No consolidative process, pneumothorax or effusion. Heart size is upper normal. Bilateral shoulder replacements and spinal fusion hardware again seen. Port-A-Cath remains in place. IMPRESSION: No acute disease. Electronically Signed   By: TInge RiseM.D.   On: 05/26/2020 14:52    SIGNED: SDeatra James MD, FHM. [facility],  Pager (please use [email or web address] to page/text) Please use Epic Secure Chat for non-urgent communication (7AM-7PM)  If 7PM-7AM, please contact night-coverage [email or web address], 05/27/2020, 11:30 AM

## 2020-05-27 NOTE — Consult Note (Signed)
Referral MD  Reason for Referral: Pulmonary embolus, history of melanoma and hemochromatosis  Chief Complaint  Patient presents with  . Abnormal Lab  : Patient is very weak with marked anemia.  HPI: [name] is well-known to [place].  He is a 75 year old white male.  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  He underwent adjuvant therapy for this with nivolumab.  He completed this back in October 2019.  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  We have been doing phlebotomies on him.  The problem now is that he paren develop some kind of viral illness.  This was a gastrointestinal illness.  He had nausea and vomiting.  There is no diarrhea.  He had dizziness.  He was following.  He subsequently had to go to the emergency room.  This was a couple days ago.  He had a CT of the brain which was negative.  However, he had new renal insufficiency with a BUN of 74 creatinine 2.3.  His hemoglobin was 8.7.  He was markedly dehydrated.  We saw him in the office yesterday.  He was somewhat hypoxic.  We thought that this may have been secondary to his anemia.  He did get IV fluids and some blood.  However, I felt that we really needed to get him into the hospital.  He was taken to the emergency room.  He subsequently was admitted.  He had a CT angiogram done which did show a small pulmonary embolism and a subsegmental right upper lobe pulmonary artery.  He had partial right lower lobe collapse which was felt to be chronic.  He has responded to IV fluids.  His BUN and creatinine have improved.  He sounds much better now.  He sounds like his old self.  He really had been quite sick.  He had this viral illness that really took him down.  He got markedly dehydrated.  He does need to have a Doppler of his legs to see if there are blood clots in his legs.  His appetite is starting to come back a little bit.   Currently, his performance status is ECOG 2.      Past Medical  History:  Diagnosis Date  . Alcoholism /alcohol abuse    per family  . Allergic rhinitis   . Allergy   . Anxiety   . Bacterial infection   . CAD (coronary artery disease)    minimal coronary plaque in the LAD and right coronary system. 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 7-06. Last nuclear stress 11-17-06 fixed anterior/ inferior defect, no inducible ischemia, EF 81%  . Cataract   . CHF (congestive heart failure) ([place])   . Chronic back pain    "all over back"  . Chronic neck pain   . COPD (chronic obstructive pulmonary disease) ([place])   . Depression   . Diverticulosis   . Dyspnea    with exertion  . Dysrhythmia 01-24-12   past hx. A.Fib x1 episode-responded to med.  . Falls frequently    "since 02/2013" (06/16/2013)  . Family history of cancer   . Genetic testing 03/06/2017   Multi-Cancer panel (83 genes) @ [facility] - No pathogenic mutations detected  . GERD (gastroesophageal reflux disease)   . Hemochromatosis    dx'd 14 yrs ago last ferritin Aug 11, 08 52 (22-322), Fe 136 ("I had 250 phlebotomies for that")  . High cholesterol  hx  . Hx of colonic polyps   . Hx of colonoscopy   . Hypertension   . Iron deficiency anemia due to chronic blood loss 12/27/2016  . Malignant melanoma of knee, left ([place])   . Myocardial infarction [facility]) 2006   "related to catheterization"  . Narcotic abuse ([place])    per family  . Osteoarthritis   . PONV (postoperative nausea and vomiting)   . RA (rheumatoid arthritis) ([place])   :  Past Surgical History:  Procedure Laterality Date  . ABDOMINAL ADHESION SURGERY  ~ 1968  . ANKLE RECONSTRUCTION Right 6-09   [facility]  . APPENDECTOMY  ~ 1956  . ASPIRATION OF ABSCESS Left 03/27/2017   Procedure: ASPIRATION OF SEROMA;  Surgeon: Stark Klein, MD;  Location: [place];  Service: General;  Laterality: Left;  . BONE TUMOR RESECTION  ~ 1954   "taken off my mastoid"  .  CARPAL TUNNEL RELEASE Right 1990's  . CATARACT EXTRACTION, BILATERAL Bilateral 01-24-12  . CORONARY ANGIOPLASTY WITH STENT PLACEMENT  2006   "while repairing 1st stent, a second area tore and they had to place 2nd stent " ?LAD & CX  . CORONARY ANGIOPLASTY WITH STENT PLACEMENT  06/17/2014  . CYST EXCISION  "several OR's"   "backX 2, back of my neck, face, inside right bicept, chest, wrist"  . FOOT SURGERY Right 11-08   for removal of bone spurs-  . HAMMER TOE SURGERY Right 07/2012   "broke 4 hammertoes"   . HARDWARE REMOVAL  03/09/2012   Procedure: HARDWARE REMOVAL;  Surgeon: Nita Sells, MD;  Location: [place];  Service: Orthopedics;  Laterality: Left;  Hardware Removal from Left Shoulder  . HARVEST BONE GRAFT  02/06/2012   Procedure: HARVEST ILIAC BONE GRAFT;  Surgeon: Nita Sells, MD;  Location: [facility];  Service: Orthopedics;;  bone marrow aspirqation   . INGUINAL HERNIA REPAIR Bilateral   . JOINT REPLACEMENT    . KNEE SURGERY Left ~ 2003   "6-12 months after uni knee removed synovial sack"  . LEFT HEART CATHETERIZATION WITH CORONARY ANGIOGRAM N/A 06/17/2014   PCI of diffuse severe stenosis in the proximal to mid LAD using overlapping drug-eluting stents.  . LUMBAR WOUND DEBRIDEMENT N/A 03/17/2013   Procedure: Incision and drainage of superficial lumbar wound;  Surgeon: Floyce Stakes, MD;  Location: [place] [facility];  Service: Neurosurgery;  Laterality: N/A;  Incision and drainage of superficial lumbar wound  . MECKEL DIVERTICULUM EXCISION  ~ 1956  . MELANOMA EXCISION WITH SENTINEL LYMPH NODE BIOPSY Left 02/12/2017   WIDE LOCAL EXCISION LEFT KNEE MELANOMA, ADVANCEMENT FLAP CLOSURE, AND SENTINEL LYMPH NODE MAPPING AND BIOPSY.  [name] MELANOMA EXCISION WITH SENTINEL LYMPH NODE BIOPSY Left 02/12/2017   Procedure: WIDE LOCAL EXCISION LEFT KNEE MELANOMA, ADVANCEMENT FLAP CLOSURE, AND SENTINEL LYMPH NODE MAPPING AND BIOPSY.;  Surgeon: Stark Klein, MD;   Location: [facility];  Service: General;  Laterality: Left;  GENERAL AND LOCAL  . ORIF SHOULDER FRACTURE  02/06/2012   Procedure: OPEN REDUCTION INTERNAL FIXATION (ORIF) SHOULDER FRACTURE;  Surgeon: Nita Sells, MD;  [facility];  Service: Orthopedics;  Laterality: Left;  ORIF of a Left Shoulder Fracture with  Iliac Crest Bone Graft aspiration   . PORTACATH PLACEMENT Left 03/27/2017   Procedure: INSERTION PORT-A-CATH;  Surgeon: Stark Klein, MD;  Location: [facility];  Service: General;  Laterality: Left;  . POSTERIOR LUMBAR FUSION  12-10   L4-5 diskectomy w/ fusion, cage placement  and rods; [place]  . POSTERIOR LUMBAR FUSION 4 LEVEL N/A 03/02/2013   Procedure: Lumbar One to Sacral One Posterior lumbar interbody fusion;  Surgeon: Floyce Stakes, MD;  Location: [place] [facility];  Service: Neurosurgery;  Laterality: N/A;  L1 to S1 Posterior lumbar interbody fusion  . REPLACEMENT UNICONDYLAR JOINT KNEE Left ~ 2003   "~ 6 months after total knee replaced"  . SHOULDER ARTHROSCOPY Left ~ 2004 X 2   "@ [facility]; left bone splinter in & had to clean it out"  . TOTAL ANKLE REPLACEMENT Right 2008   at [facility]  . TOTAL KNEE ARTHROPLASTY Bilateral 2002  . TOTAL SHOULDER REPLACEMENT Left 2006  . TOTAL SHOULDER REPLACEMENT Right ~ 2007   Dr. Marlou Sa  :   Current Facility-Administered Medications:  .  0.9 %  sodium chloride infusion, , Intravenous, Continuous, Lenore Cordia, MD, Last Rate: 125 mL/hr at 05/27/20 0152, New Bag at 05/27/20 0152 .  acetaminophen (TYLENOL) tablet 650 mg, 650 mg, Oral, Q6H PRN **OR** acetaminophen (TYLENOL) suppository 650 mg, 650 mg, Rectal, Q6H PRN, Patel, Vishal R, MD .  albuterol (VENTOLIN HFA) 108 (90 Base) MCG/ACT inhaler 1-2 puff, 1-2 puff, Inhalation, Q4H PRN, Zada Finders R, MD .  ALPRAZolam Duanne Moron) tablet 0.5 mg, 0.5 mg, Oral, TID PRN, Zada Finders R, MD, 0.5 mg at 05/27/20 0154 .  Chlorhexidine Gluconate Cloth 2 % PADS 6 each, 6 each, Topical,  Daily, Patel, Vishal R, MD .  FLUoxetine (PROZAC) capsule 40 mg, 40 mg, Oral, Daily, Patel, Vishal R, MD .  gabapentin (NEURONTIN) capsule 300 mg, 300 mg, Oral, BID, Patel, Vishal R, MD .  HYDROmorphone (DILAUDID) tablet 2-3 mg, 2-3 mg, Oral, Q8H PRN, Lenore Cordia, MD, 2 mg at 05/27/20 0413 .  metoprolol tartrate (LOPRESSOR) tablet 25 mg, 25 mg, Oral, BID, Patel, Vishal R, MD .  ondansetron (ZOFRAN) tablet 4 mg, 4 mg, Oral, Q6H PRN **OR** ondansetron (ZOFRAN) injection 4 mg, 4 mg, Intravenous, Q6H PRN, Posey Pronto, Vishal R, MD .  pravastatin (PRAVACHOL) tablet 20 mg, 20 mg, Oral, Daily, Patel, Vishal R, MD .  predniSONE (DELTASONE) tablet 20 mg, 20 mg, Oral, Q breakfast, Patel, Vishal R, MD  Facility-Administered Medications Ordered in Other Encounters:  .  furosemide (LASIX) injection 20 mg, 20 mg, Intravenous, Once, [place], Sarah M, NP .  heparin lock flush 100 unit/mL, 250 Units, Intracatheter, PRN, [place], Sarah M, NP .  sodium chloride flush (NS) 0.9 % injection 10 mL, 10 mL, Intracatheter, PRN, [place], Holli Humbles, NP:  . Chlorhexidine Gluconate Cloth  6 each Topical Daily  . FLUoxetine  40 mg Oral Daily  . gabapentin  300 mg Oral BID  . metoprolol tartrate  25 mg Oral BID  . pravastatin  20 mg Oral Daily  . predniSONE  20 mg Oral Q breakfast  :  Allergies  Allergen Reactions  . Cefepime Hives and Shortness Of Breath  . Cephaeline Hives  . Morphine Swelling    A swollen stomach.  . Morphine And Related Shortness Of Breath, Nausea And Vomiting, Swelling and Other (See Comments)    Agitation, tolerates dilaudid Other reaction(s): Other (See Comments) Agitation, tolerates dilaudid  . Penicillins Hives, Shortness Of Breath and Rash    Has patient had a PCN reaction causing immediate rash, facial/tongue/throat swelling, SOB or lightheadedness with hypotension: Yes Has patient had a PCN reaction causing severe rash involving mucus membranes or skin necrosis: Yes Has patient  had a PCN reaction that required hospitalization No Has  patient had a PCN reaction occurring within the last 10 years: No If all of the above answers are "NO", then may proceed with Cephalosporin use.   [name] Doxycycline Rash  . Oxycodone-Acetaminophen Other (See Comments)    Patient doesn't remember what type of reaction.  :  Family History  Problem Relation Age of Onset  . Uterine cancer Mother        uterine vs. cervix? dx 33s; deceased 8  . Macular degeneration Mother   . Other Mother        ankle edema  . Lung cancer Mother   . Melanoma Mother        multiple spots on legs  . Cancer - Lung Mother        smoker  . Coronary artery disease Father   . Hypertension Father   . Prostate cancer Father   . Colon polyps Father   . Cancer - Prostate Father 57       deceased 26  . Heart attack Brother   . Hyperlipidemia Brother   . Other Brother        Schizophrenic  . Thyroid disease Sister   . Hemochromatosis Sister   . Rheum arthritis Sister   . Coronary artery disease Maternal Aunt   . Heart attack Maternal Aunt   . Cancer Maternal Grandmother        unk. type; deceased 20s  . Diabetes Neg Hx   . Colon cancer Neg Hx   . Esophageal cancer Neg Hx   . Rectal cancer Neg Hx   . Stomach cancer Neg Hx   :  Social History   Socioeconomic History  . Marital status: Significant Other    Spouse name: Not on file  . Number of children: 2  . Years of education: 58  . Highest education level: Not on file  Occupational History  . 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    Comment: self employed  Tobacco Use  . Smoking status: Never Smoker  . Smokeless tobacco: Never Used  Vaping Use  . Vaping Use: Never used  Substance and Sexual Activity  . Alcohol use: No    Alcohol/week: 0.0 standard drinks    Comment: "I do not drink anymore"  . Drug use: No  . Sexual activity: Yes    Partners: Female  Other Topics Concern  . Not on file  Social History Narrative    [employer] [place]. Married 1972-48yr seperated/divorced. Engaged April '11. 1 son '74; step-daughter '70. 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 During '11 discovered an [employer]- $500,000+ loss. Is handling this OK - will keep the business running.   Social Determinants of Health   Financial Resource Strain: Not on file  Food Insecurity: Not on file  Transportation Needs: Not on file  Physical Activity: Not on file  Stress: Not on file  Social Connections: Not on file  Intimate Partner Violence: Not on file  :  Review of Systems  Constitutional: Positive for malaise/fatigue.  HENT: Negative.   Eyes: Negative.   Respiratory: Positive for shortness of breath.   Cardiovascular: Positive for palpitations.  Gastrointestinal: Positive for nausea and vomiting.  Genitourinary: Negative.   Musculoskeletal: Positive for joint pain and myalgias.  Skin: Negative.   Neurological: Negative.   Endo/Heme/Allergies: Negative.   Psychiatric/Behavioral: Negative.      Exam: As above Patient  Vitals for the past 24 hrs:  BP Temp Temp src Pulse Resp SpO2 Height Weight  05/27/20 0405 (!) 98/52 98.6 F (37 C) -- 77 18 90 % -- --  05/26/20 2343 118/83 98.1 F (36.7 C) -- 69 18 98 % -- --  05/26/20 2028 (!) 109/57 98.1 F (36.7 C) Oral 68 20 94 % -- --  05/26/20 2013 -- -- -- -- -- 99 % -- --  05/26/20 1830 (!) 110/55 -- -- (!) 143 17 90 % -- --  05/26/20 1815 122/71 -- -- 72 (!) 23 98 % -- --  05/26/20 1730 118/76 -- -- 66 15 99 % -- --  05/26/20 1717 101/83 -- -- 70 18 100 % -- --  05/26/20 1630 (!) 108/48 -- -- 61 (!) 21 97 % -- --  05/26/20 1600 119/70 -- -- 66 (!) 24 92 % -- --  05/26/20 1530 102/66 -- -- 61 16 94 % -- --  05/26/20 1500 107/64 -- -- 71 18 95 % -- --  05/26/20 1430 117/61 -- -- 68 18 100 % -- --  05/26/20 1400 101/61 -- -- 65 20 100 % -- --   05/26/20 1351 -- -- -- -- -- 100 % -- --  05/26/20 1348 -- -- -- -- -- -- 5' 6"  (1.676 m) 150 lb 2.1 oz (68.1 kg)  05/26/20 1345 (!) 150/130 97.8 F (36.6 C) Oral 66 17 100 % -- --     Recent Labs    05/25/20 0912 05/26/20 1438  WBC 9.3 9.6  HGB 8.7* 9.7*  HCT 26.6* 29.5*  PLT 363 356   Recent Labs    05/25/20 0912 05/26/20 1438  NA 126* 125*  K 3.6 3.8  CL 87* 89*  CO2 32 26  GLUCOSE 94 196*  BUN 74* 54*  CREATININE 2.32* 1.52*  CALCIUM 8.2* 7.7*    Blood smear review: None  Pathology: None    Assessment and Plan: Mr. Valeriano is a 75 year old white male.  He has a past history of melanoma.  I do not believe this is a problem right now.  He had this viral syndrome and was incredibly dehydrated.  He was anemic.  This may have been from his renal insufficiency.  He got 2 units of blood in the office yesterday.  He got IV fluids in the office yesterday.  He was sent to the emergency room.  He was admitted.  He was found to have a pulmonary embolism.  I will put him on Eliquis.  I think this is reasonable.  I do not think he needs any Lovenox.  This is a small pulmonary embolism.  We will have to see what his legs look like.  It would not surprise me if he had blood clots in his legs.  His renal function is improving.  We will have to see how this continues to improve.  I suspect the anemia is probably from his renal insufficiency.  He was checked for stool in the blood in the office and this was negative.  I know that he will get outstanding care from all the staff on [facility] [address].  I know that they are still compassionate and will really do a good job with him.  [name], MD  [name] 12:5

## 2020-05-27 NOTE — Progress Notes (Signed)
Bilateral lower extremity venous duplex has been completed. Preliminary results can be found in CV Proc through chart review.  Results were given to the patient's nurse, Mendel Ryder.  05/27/20 11:15 AM Carlos Levering RVT

## 2020-05-27 NOTE — H&P (Signed)
History and Physical    [name]:[id number] DOB: Dec 17, 1945 DOA: 05/26/2020  PCP: Cassandria Anger, MD  Patient coming from: [facility]  I have personally briefly reviewed patient's old medical records in [place] [facility]  HPI: [name] is a 75 y.o. 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  Patient initially seen in oncology office 05/25/2020 and noted to have drop in hemoglobin from baseline 12 down to 8.7.  He did not have any obvious bleeding.  Labs also showed new AKI with creatinine 2.32 compared to normal renal function.  He came to the [facility] on 05/26/2020 to receive 1 unit PRBC transfusion.  On arrival he was noted to be hypoxic with SPO2 in the high 70s.  He became pale and diaphoretic and had a drop in blood pressure.  He was placed on 2 L O2 via [place] and taken down to the [facility] ED while blood was fusing transfusing through his Port-A-Cath.  Patient did report recent episode of gastroenteritis with nausea and diarrhea which is resolved about 4 days ago.  He did not have any emesis at the time.  He has not seen any obvious bleeding anywhere.  He denies any chest pain, palpitations, abdominal pain, dysuria.  [facility] ED Course:  Initial vitals showed BP 93/60, pulse 95, RR 18, temp 98.5 F, SPO2 93% on room air.  Labs notable for hemoglobin improved to 9.7, WBC 9.6, platelets 356,000, creatinine improving to 1.52, sodium 125 near recent baseline, potassium 3.8, bicarb 26, BUN 54, serum glucose 196, high-sensitivity troponin I negative x2.  FOBT negative.  SARS-CoV-2 PCR is  negative.  Portable chest x-ray shows Port-A-Cath in place without acute disease.  Chronic elevation of right hemidiaphragm noted.  CTA chest PE study shows a small nonocclusive subsegmental right upper lobe PE.  Chronic elevation right hemidiaphragm with partial right lower lobe consolidation seen which appears chronic.  Patient was given 1 L normal saline, albuterol inhaler, and Tylenol.  The hospitalist service was consulted to admit for further evaluation and management.  Review of Systems: All systems reviewed and are negative except as documented in history of present illness above.   Past Medical History:  Diagnosis Date  . Alcoholism /alcohol abuse    per family  . Allergic rhinitis   . Allergy   . Anxiety   . Bacterial infection   . CAD (coronary artery disease)    minimal coronary plaque in the LAD and right coronary system. 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 7-06. Last nuclear stress 11-17-06 fixed anterior/ inferior defect, no inducible ischemia, EF 81%  . Cataract   . CHF (congestive heart failure) ([place])   . Chronic back pain    "all over back"  . Chronic neck pain   . COPD (chronic obstructive pulmonary disease) ([place])   . Depression   . Diverticulosis   . Dyspnea    with exertion  . Dysrhythmia 01-24-12   past hx. A.Fib x1 episode-responded to med.  . Falls frequently    "since 02/2013" (06/16/2013)  . Family history of cancer   .  Genetic testing 03/06/2017   Multi-Cancer panel (83 genes) @ [facility] - No pathogenic mutations detected  . GERD (gastroesophageal reflux disease)   . Hemochromatosis    dx'd 14 yrs ago last ferritin Aug 11, 08 52 (22-322), Fe 136 ("I had 250 phlebotomies for that")  . High cholesterol    hx  . Hx of colonic polyps   . Hx of colonoscopy   . Hypertension   . Iron deficiency anemia due to chronic blood loss 12/27/2016  . Malignant melanoma of knee, left ([place])   .  Myocardial infarction [facility]) 2006   "related to catheterization"  . Narcotic abuse ([place])    per family  . Osteoarthritis   . PONV (postoperative nausea and vomiting)   . RA (rheumatoid arthritis) ([place])     Past Surgical History:  Procedure Laterality Date  . ABDOMINAL ADHESION SURGERY  ~ 1968  . ANKLE RECONSTRUCTION Right 6-09   [facility]  . APPENDECTOMY  ~ 1956  . ASPIRATION OF ABSCESS Left 03/27/2017   Procedure: ASPIRATION OF SEROMA;  Surgeon: Stark Klein, MD;  Location: [place];  Service: General;  Laterality: Left;  . BONE TUMOR RESECTION  ~ 1954   "taken off my mastoid"  . CARPAL TUNNEL RELEASE Right 1990's  . CATARACT EXTRACTION, BILATERAL Bilateral 01-24-12  . CORONARY ANGIOPLASTY WITH STENT PLACEMENT  2006   "while repairing 1st stent, a second area tore and they had to place 2nd stent " ?LAD & CX  . CORONARY ANGIOPLASTY WITH STENT PLACEMENT  06/17/2014  . CYST EXCISION  "several OR's"   "backX 2, back of my neck, face, inside right bicept, chest, wrist"  . FOOT SURGERY Right 11-08   for removal of bone spurs-  . HAMMER TOE SURGERY Right 07/2012   "broke 4 hammertoes"   . HARDWARE REMOVAL  03/09/2012   Procedure: HARDWARE REMOVAL;  Surgeon: Nita Sells, MD;  Location: [place];  Service: Orthopedics;  Laterality: Left;  Hardware Removal from Left Shoulder  . HARVEST BONE GRAFT  02/06/2012   Procedure: HARVEST ILIAC BONE GRAFT;  Surgeon: Nita Sells, MD;  Location: [facility];  Service: Orthopedics;;  bone marrow aspirqation   . INGUINAL HERNIA REPAIR Bilateral   . JOINT REPLACEMENT    . KNEE SURGERY Left ~ 2003   "6-12 months after uni knee removed synovial sack"  . LEFT HEART CATHETERIZATION WITH CORONARY ANGIOGRAM N/A 06/17/2014   PCI of diffuse severe stenosis in the proximal to mid LAD using overlapping drug-eluting stents.  . LUMBAR WOUND DEBRIDEMENT N/A 03/17/2013   Procedure: Incision and drainage of superficial  lumbar wound;  Surgeon: Floyce Stakes, MD;  Location: [place] [facility];  Service: Neurosurgery;  Laterality: N/A;  Incision and drainage of superficial lumbar wound  . MECKEL DIVERTICULUM EXCISION  ~ 1956  . MELANOMA EXCISION WITH SENTINEL LYMPH NODE BIOPSY Left 02/12/2017   WIDE LOCAL EXCISION LEFT KNEE MELANOMA, ADVANCEMENT FLAP CLOSURE, AND SENTINEL LYMPH NODE MAPPING AND BIOPSY.  [name] MELANOMA EXCISION WITH SENTINEL LYMPH NODE BIOPSY Left 02/12/2017   Procedure: WIDE LOCAL EXCISION LEFT KNEE MELANOMA, ADVANCEMENT FLAP CLOSURE, AND SENTINEL LYMPH NODE MAPPING AND BIOPSY.;  Surgeon: Stark Klein, MD;  Location: [place];  Service: General;  Laterality: Left;  GENERAL AND LOCAL  . ORIF SHOULDER FRACTURE  02/06/2012   Procedure: OPEN REDUCTION INTERNAL FIXATION (ORIF) SHOULDER FRACTURE;  Surgeon: Nita Sells, MD;  Location: [facility];  Service: Orthopedics;  Laterality: Left;  ORIF of  a Left Shoulder Fracture with  Iliac Crest Bone Graft aspiration   . PORTACATH PLACEMENT Left 03/27/2017   Procedure: INSERTION PORT-A-CATH;  Surgeon: Stark Klein, MD;  Location: [place];  Service: General;  Laterality: Left;  . POSTERIOR LUMBAR FUSION  12-10   L4-5 diskectomy w/ fusion, cage placement and rods; [place]  . POSTERIOR LUMBAR FUSION 4 LEVEL N/A 03/02/2013   Procedure: Lumbar One to Sacral One Posterior lumbar interbody fusion;  Surgeon: Floyce Stakes, MD;  Location: [place] [facility];  Service: Neurosurgery;  Laterality: N/A;  L1 to S1 Posterior lumbar interbody fusion  . REPLACEMENT UNICONDYLAR JOINT KNEE Left ~ 2003   "~ 6 months after total knee replaced"  . SHOULDER ARTHROSCOPY Left ~ 2004 X 2   "@ [facility]; left bone splinter in & had to clean it out"  . TOTAL ANKLE REPLACEMENT Right 2008   at [facility]  . TOTAL KNEE ARTHROPLASTY Bilateral 2002  . TOTAL SHOULDER REPLACEMENT Left 2006  . TOTAL SHOULDER REPLACEMENT Right ~ 2007   Dr. Marlou Sa    Social History:  reports that he has  never smoked. He has never used smokeless tobacco. He reports that he does not drink alcohol and does not use drugs.  Allergies  Allergen Reactions  . Cefepime Hives and Shortness Of Breath  . Cephaeline Hives  . Morphine Swelling    A swollen stomach.  . Morphine And Related Shortness Of Breath, Nausea And Vomiting, Swelling and Other (See Comments)    Agitation, tolerates dilaudid Other reaction(s): Other (See Comments) Agitation, tolerates dilaudid  . Penicillins Hives, Shortness Of Breath and Rash    Has patient had a PCN reaction causing immediate rash, facial/tongue/throat swelling, SOB or lightheadedness with hypotension: Yes Has patient had a PCN reaction causing severe rash involving mucus membranes or skin necrosis: Yes Has patient had a PCN reaction that required hospitalization No Has patient had a PCN reaction occurring within the last 10 years: No If all of the above answers are "NO", then may proceed with Cephalosporin use.   [name] Doxycycline Rash  . Oxycodone-Acetaminophen Other (See Comments)    Patient doesn't remember what type of reaction.    Family History  Problem Relation Age of Onset  . Uterine cancer Mother        uterine vs. cervix? dx 27s; deceased 43  . Macular degeneration Mother   . Other Mother        ankle edema  . Lung cancer Mother   . Melanoma Mother        multiple spots on legs  . Cancer - Lung Mother        smoker  . Coronary artery disease Father   . Hypertension Father   . Prostate cancer Father   . Colon polyps Father   . Cancer - Prostate Father 56       deceased 35  . Heart attack Brother   . Hyperlipidemia Brother   . Other Brother        Schizophrenic  . Thyroid disease Sister   . Hemochromatosis Sister   . Rheum arthritis Sister   . Coronary artery disease Maternal Aunt   . Heart attack Maternal Aunt   . Cancer Maternal Grandmother        unk. type; deceased 35s  . Diabetes Neg Hx   . Colon cancer Neg Hx   . Esophageal  cancer Neg Hx   . Rectal cancer Neg Hx   . Stomach cancer  Neg Hx      Prior to Admission medications   Medication Sig Start Date End Date Taking? Authorizing Provider  albuterol (PROVENTIL HFA;VENTOLIN HFA) 108 (90 Base) MCG/ACT inhaler Inhale 1-2 puffs into the lungs every 4 (four) hours as needed for wheezing or shortness of breath. 09/11/16   Plotnikov, Evie Lacks, MD  ALPRAZolam Duanne Moron) 0.5 MG tablet Take 1 tablet (0.5 mg total) by mouth 3 (three) times daily as needed. for anxiety 02/07/20   Plotnikov, Evie Lacks, MD  Cholecalciferol (VITAMIN D3) 2000 UNITS capsule Take 1 capsule (2,000 Units total) by mouth daily. 12/05/14   Plotnikov, Evie Lacks, MD  clopidogrel (PLAVIX) 75 MG tablet TAKE 1 TABLET BY MOUTH EVERY DAY WITH BREAKFAST 03/15/20   Plotnikov, Evie Lacks, MD  FLUoxetine (PROZAC) 40 MG capsule Take 40 mg by mouth daily.    [provider]  fluticasone (FLONASE) 50 MCG/ACT nasal spray INSTILL 2 SPRAYS IN EACH NOSTRIL EVERY DAY 11/19/17   Plotnikov, Evie Lacks, MD  gabapentin (NEURONTIN) 300 MG capsule TAKE 1 CAPSULE(300 MG) BY MOUTH TWICE DAILY 04/26/20   Plotnikov, Evie Lacks, MD  hydrochlorothiazide (MICROZIDE) 12.5 MG capsule Take 1 capsule (12.5 mg total) by mouth daily. 03/21/20   Plotnikov, Evie Lacks, MD  HYDROmorphone (DILAUDID) 2 MG tablet Take 1-1.5 tablets (2-3 mg total) by mouth every 8 (eight) hours as needed for severe pain. 05/04/20   Plotnikov, Evie Lacks, MD  HYDROmorphone (DILAUDID) 2 MG tablet Take 1 tablet (2 mg total) by mouth every 8 (eight) hours as needed for severe pain. 05/04/20   Plotnikov, Evie Lacks, MD  lidocaine-prilocaine (EMLA) cream Apply 1 application topically as needed. 05/25/20   [place], Holli Humbles, NP  losartan (COZAAR) 50 MG tablet Take 1 tablet (50 mg total) by mouth daily. 03/21/20   Plotnikov, Evie Lacks, MD  Melatonin 5 MG TABS Take 5 mg by mouth at bedtime. Takes 10 mg at [facility]    [provider]  metoprolol tartrate (LOPRESSOR) 25 MG tablet  TAKE 1 TABLET (25 MG TOTAL) BY MOUTH 2 (TWO) TIMES DAILY. 02/17/20   Plotnikov, Evie Lacks, MD  Omega-3 Fatty Acids (FISH OIL OMEGA-3) 1000 MG CAPS Take 1,000 mg by mouth daily.     [provider]  ondansetron (ZOFRAN) 8 MG tablet Take 1 tablet (8 mg total) by mouth 3 (three) times daily. 11/20/17   [name], MD  pantoprazole (PROTONIX) 40 MG tablet TAKE 1 TABLET (40 MG TOTAL) BY MOUTH EVERY EVENING. Patient taking differently: Take 40 mg by mouth every evening. Pt takes 1/2 a tablet daily 03/20/19   Plotnikov, Evie Lacks, MD  pravastatin (PRAVACHOL) 20 MG tablet Take 1 tablet (20 mg total) by mouth daily. 01/17/16   Plotnikov, Evie Lacks, MD  predniSONE (DELTASONE) 10 MG tablet 20 mg po qam pc or as directed 12/23/19   Plotnikov, Evie Lacks, MD  predniSONE (DELTASONE) 5 MG tablet TAKE 2 TABLETS (10 MG TOTAL) BY MOUTH DAILY WITH BREAKFAST. 02/17/20   Plotnikov, Evie Lacks, MD  prochlorperazine (COMPAZINE) 10 MG tablet TAKE 1 TABLET(10 MG) BY MOUTH EVERY 6 HOURS AS NEEDED FOR NAUSEA OR VOMITING 04/11/17   [name], MD  Pyridoxine HCl (B-6 PO) Take 1 tablet by mouth.     [provider]  tadalafil (CIALIS) 5 MG tablet Take 5 mg by mouth as needed.  12/09/15   [provider]  torsemide (DEMADEX) 10 MG tablet TAKE 1 TO 2 TABLETS(10 TO 20 MG) BY MOUTH DAILY 05/23/20  Plotnikov, Evie Lacks, MD  traZODone (DESYREL) 100 MG tablet TAKE 1 TABLET AT BEDTIME 04/09/20   Plotnikov, Evie Lacks, MD  vitamin B-12 (CYANOCOBALAMIN) 1000 MCG tablet Take 1,000 mcg by mouth daily.     [provider]    Physical Exam: Vitals:   05/26/20 1830 05/26/20 2013 05/26/20 2028 05/26/20 2343  BP: (!) 110/55  (!) 109/57 118/83  Pulse: (!) 143  68 69  Resp: 17  20 18   Temp:   98.1 F (36.7 C) 98.1 F (36.7 C)  TempSrc:   Oral   SpO2: 90% 99% 94% 98%  Weight:      Height:       Constitutional: Resting supine in bed, NAD, calm, comfortable Eyes: PERRL, lids and conjunctivae  normal ENMT: Mucous membranes are moist. Posterior pharynx clear of any exudate or lesions.Normal dentition.  Neck: normal, supple, no masses. Respiratory: clear to auscultation bilaterally, no wheezing, no crackles. Normal respiratory effort. No accessory muscle use.  Cardiovascular: Regular rate and rhythm, 2/6 systolic murmur present. No extremity edema. 2+ pedal pulses.  Port-A-Cath in place left chest wall. Abdomen: no tenderness, no masses palpated. Bowel sounds positive.  Musculoskeletal: no clubbing / cyanosis. No joint deformity upper and lower extremities. Good ROM, no contractures. Normal muscle tone.  Skin: Lumbar surgical scar present.  No rashes, lesions, ulcers. No induration Neurologic: CN 2-12 grossly intact. Sensation intact. Strength 5/5 in all 4.  Psychiatric: Normal judgment and insight. Alert and oriented x 3. Normal mood.   Labs on Admission: I have personally reviewed following labs and imaging studies  CBC: Recent Labs  Lab 05/25/20 0912 05/26/20 1438  WBC 9.3 9.6  NEUTROABS 6.2 8.7*  HGB 8.7* 9.7*  HCT 26.6* 29.5*  MCV 94.0 94.2  PLT 363 607   Basic Metabolic Panel: Recent Labs  Lab 05/25/20 0912 05/26/20 1438  NA 126* 125*  K 3.6 3.8  CL 87* 89*  CO2 32 26  GLUCOSE 94 196*  BUN 74* 54*  CREATININE 2.32* 1.52*  CALCIUM 8.2* 7.7*   GFR: Estimated Creatinine Clearance: 39.1 mL/min (A) (by C-G formula based on SCr of 1.52 mg/dL (H)). Liver Function Tests: Recent Labs  Lab 05/25/20 0912 05/26/20 1438  AST 11* 14*  ALT 8 10  ALKPHOS 41 46  BILITOT 1.0 2.1*  PROT 5.9* 5.8*  ALBUMIN 3.6 3.2*   No results for input(s): LIPASE, AMYLASE in the last 168 hours. No results for input(s): AMMONIA in the last 168 hours. Coagulation Profile: No results for input(s): INR, PROTIME in the last 168 hours. Cardiac Enzymes: No results for input(s): CKTOTAL, CKMB, CKMBINDEX, TROPONINI in the last 168 hours. BNP (last 3 results) No results for input(s):  PROBNP in the last 8760 hours. HbA1C: No results for input(s): HGBA1C in the last 72 hours. CBG: No results for input(s): GLUCAP in the last 168 hours. Lipid Profile: No results for input(s): CHOL, HDL, LDLCALC, TRIG, CHOLHDL, LDLDIRECT in the last 72 hours. Thyroid Function Tests: No results for input(s): TSH, T4TOTAL, FREET4, T3FREE, THYROIDAB in the last 72 hours. Anemia Panel: Recent Labs    05/25/20 1236 05/25/20 1237  FERRITIN 210  --   TIBC 191*  --   IRON 50  --   RETICCTPCT  --  5.3*   Urine analysis:    Component Value Date/Time   COLORURINE YELLOW 05/26/2020 1404   APPEARANCEUR CLEAR 05/26/2020 1404   LABSPEC 1.010 05/26/2020 1404   PHURINE 5.0 05/26/2020 1404   GLUCOSEU NEGATIVE  05/26/2020 1404   GLUCOSEU NEGATIVE 03/10/2015 1008   HGBUR NEGATIVE 05/26/2020 1404   BILIRUBINUR NEGATIVE 05/26/2020 1404   KETONESUR NEGATIVE 05/26/2020 1404   PROTEINUR NEGATIVE 05/26/2020 1404   UROBILINOGEN 0.2 03/10/2015 1008   NITRITE NEGATIVE 05/26/2020 1404   LEUKOCYTESUR NEGATIVE 05/26/2020 1404    Radiological Exams on Admission: CT HEAD WO CONTRAST  Result Date: 05/25/2020 CLINICAL DATA:  Dizziness, history of melanoma EXAM: CT HEAD WITHOUT CONTRAST TECHNIQUE: Contiguous axial images were obtained from the base of the skull through the vertex without intravenous contrast. COMPARISON:  2018 FINDINGS: Brain: There is no acute intracranial hemorrhage, mass effect, or edema. Gray-white differentiation is preserved. There is no extra-axial fluid collection. Prominence of the ventricles and sulci reflects mild generalized volume. Patchy hypoattenuation in the supratentorial white matter is nonspecific but probably reflects mild chronic microvascular ischemic changes. Vascular: There is atherosclerotic calcification at the skull base. Skull: Calvarium is unremarkable. Sinuses/Orbits: No acute finding. Other: None. IMPRESSION: No acute intracranial abnormality. Similar chronic  microvascular ischemic changes. Electronically Signed   By: Macy Mis M.D.   On: 05/25/2020 10:33   CT Angio Chest PE W and/or Wo Contrast  Result Date: 05/26/2020 CLINICAL DATA:  Low hemoglobin confusion EXAM: CT ANGIOGRAPHY CHEST WITH CONTRAST TECHNIQUE: Multidetector CT imaging of the chest was performed using the standard protocol during bolus administration of intravenous contrast. Multiplanar CT image reconstructions and MIPs were obtained to evaluate the vascular anatomy. CONTRAST:  172m OMNIPAQUE IOHEXOL 350 MG/ML SOLN COMPARISON:  Chest x-ray 05/26/2020, CT 10/14/2017 FINDINGS: Cardiovascular: Satisfactory opacification of the pulmonary arteries to the segmental level. Small nonocclusive filling defect within subsegmental right upper lobe pulmonary vessel, series 8, image number 80. No other definitive filling defects are visualized. Nonaneurysmal aorta. Moderate aortic atherosclerosis. Coronary vascular calcification. Normal cardiac size. No pericardial effusion. Left-sided central venous port with tip in the SVC. Mediastinum/Nodes: Midline trachea. No thyroid mass. No suspicious nodes. Esophagus within normal limits. Lungs/Pleura: No pleural effusion or pneumothorax. Chronic elevation right diaphragm. Partial consolidation right lower lobe slightly increased. Upper Abdomen: No acute abnormality. Incompletely visualized right hepatic cysts. Musculoskeletal: Degenerative changes. Anterolisthesis C7 on T1. Partially visualized lower spinal hardware. No acute osseous abnormality Review of the MIP images confirms the above findings. IMPRESSION: 1. Small nonocclusive filling defect within a subsegmental right upper lobe pulmonary vessel, consistent with small pulmonary embolus. 2. Chronic elevation right diaphragm with partial right lower lobe consolidation, some of which appears chronic. Acute process such as pneumonia superimposed on chronic atelectasis could be considered. 3. Aortic  atherosclerosis. Aortic Atherosclerosis (ICD10-I70.0). Critical Value/emergent results were called by telephone at the time of interpretation on 05/26/2020 at 6:08 pm [facility], who verbally acknowledged these results. Electronically Signed   By: KDonavan FoilM.D.   On: 05/26/2020 18:09   DG Chest Portable 1 View  Result Date: 05/26/2020 CLINICAL DATA:  Shortness of breath. EXAM: PORTABLE CHEST 1 VIEW COMPARISON:  PA and lateral chest 07/02/2017. FINDINGS: Lung volumes are low and there is chronic elevation of the right hemidiaphragm. No consolidative process, pneumothorax or effusion. Heart size is upper normal. Bilateral shoulder replacements and spinal fusion hardware again seen. Port-A-Cath remains in place. IMPRESSION: No acute disease. Electronically Signed   By: TInge RiseM.D.   On: 05/26/2020 14:52    EKG: Personally reviewed. Normal sinus rhythm with low voltage.  Not significantly changed when compared to prior.  Assessment/Plan Principal Problem:   Acute respiratory failure with hypoxia (HCC) Active  Problems:   Essential hypertension   Coronary atherosclerosis   COPD (chronic obstructive pulmonary disease) (HCC)   Chronic pain syndrome   Hyponatremia   Symptomatic anemia   Pulmonary embolus (HCC)    [name] is a 75 y.o. 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  Acute respiratory failure with hypoxia: Likely multifactorial from anemia and atelectasis.  Small subsegmental PE also seen on CTA however seems less likely to cause initial degree of hypoxia. -Continue supplemental O2, currently requiring 2 L via [place], wean as able -Incentive spirometer, albuterol as needed -Consider addition of  anticoagulation once hemoglobin stable  Symptomatic anemia: S/p 1 unit PRBC transfusion prior to transfer to inpatient bed.  Hemoglobin improving.  Has not had any obvious bleeding.  Recheck in AM.  Small subsegmental right upper lobe PE: Seen on CTA as above.  Consider addition of anticoagulation as long as hemoglobin remained stable with no obvious bleeding.  AKI: Likely primarily due to GI losses from recent gastroenteritis.  Renal function improving.  Continue IV fluids and hold home HCTZ, losartan, torsemide.  CAD s/p PCI: Chronic and stable, denies any chest pain.  Plavix currently on hold.  COPD: Chronic without evidence of acute exacerbation.  Continue albuterol as needed.  Hypertension: Resume home Lopressor, holding HCTZ and losartan.  Hyperlipidemia: Continue statin.  Chronic hyponatremia: Chronic and relatively stable.  Holding home HCTZ and torsemide.  Anxiety: Continue home Xanax as needed with hold parameters.  Chronic back pain: Continue home Dilaudid with hold parameters in prednisone 20 mg daily.  DVT prophylaxis: SCDs only for now Code Status: DNR, confirmed with patient Family Communication: Discussed with patient Disposition Plan: From [facility] Consults called: None Level of care: Telemetry Admission status:  Status is: Observation  The patient remains OBS appropriate and will d/c before 2 midnights.  Dispo: The patient is from: [facility] living              Anticipated d/c is to: Previous environment              Anticipated d/c date is: 1 day              Patient currently is not medically stable to d/c.   Difficult to place patient No   Zada Finders MD [facility]  If 7PM-7AM, please contact night-coverage [email or web address]  05/27/2020, 1:01 AM

## 2020-05-28 DIAGNOSIS — J9601 Acute respiratory failure with hypoxia: Secondary | ICD-10-CM | POA: Diagnosis not present

## 2020-05-28 LAB — COMPREHENSIVE METABOLIC PANEL
ALT: 10 U/L (ref 0–44)
AST: 11 U/L — ABNORMAL LOW (ref 15–41)
Albumin: 2.8 g/dL — ABNORMAL LOW (ref 3.5–5.0)
Alkaline Phosphatase: 37 U/L — ABNORMAL LOW (ref 38–126)
Anion gap: 8 (ref 5–15)
BUN: 22 mg/dL (ref 8–23)
CO2: 30 mmol/L (ref 22–32)
Calcium: 7.7 mg/dL — ABNORMAL LOW (ref 8.9–10.3)
Chloride: 96 mmol/L — ABNORMAL LOW (ref 98–111)
Creatinine, Ser: 0.82 mg/dL (ref 0.61–1.24)
GFR, Estimated: >60 mL/min (ref >60)
Glucose, Bld: 82 mg/dL (ref 70–99)
Potassium: 3.2 mmol/L — ABNORMAL LOW (ref 3.5–5.1)
Sodium: 134 mmol/L — ABNORMAL LOW (ref 135–145)
Total Bilirubin: 1.4 mg/dL — ABNORMAL HIGH (ref 0.3–1.2)
Total Protein: 5.2 g/dL — ABNORMAL LOW (ref 6.5–8.1)

## 2020-05-28 LAB — CBC WITH DIFFERENTIAL/PLATELET
Abs Immature Granulocytes: 0.08 10*3/uL — ABNORMAL HIGH (ref 0.00–0.07)
Basophils Absolute: 0 10*3/uL (ref 0.0–0.1)
Basophils Relative: 0 %
Eosinophils Absolute: 0 10*3/uL (ref 0.0–0.5)
Eosinophils Relative: 0 %
HCT: 28.7 % — ABNORMAL LOW (ref 39.0–52.0)
Hemoglobin: 9.2 g/dL — ABNORMAL LOW (ref 13.0–17.0)
Immature Granulocytes: 1 %
Lymphocytes Relative: 18 %
Lymphs Abs: 1.4 10*3/uL (ref 0.7–4.0)
MCH: 31 pg (ref 26.0–34.0)
MCHC: 32.1 g/dL (ref 30.0–36.0)
MCV: 96.6 fL (ref 80.0–100.0)
Monocytes Absolute: 0.8 10*3/uL (ref 0.1–1.0)
Monocytes Relative: 10 %
Neutro Abs: 5.6 10*3/uL (ref 1.7–7.7)
Neutrophils Relative %: 71 %
Platelets: 317 10*3/uL (ref 150–400)
RBC: 2.97 MIL/uL — ABNORMAL LOW (ref 4.22–5.81)
RDW: 14.1 % (ref 11.5–15.5)
WBC: 7.9 10*3/uL (ref 4.0–10.5)
nRBC: 0 % (ref 0.0–0.2)

## 2020-05-28 LAB — MAGNESIUM: Magnesium: 1.9 mg/dL (ref 1.7–2.4)

## 2020-05-28 MED ORDER — POTASSIUM CHLORIDE CRYS ER 20 MEQ PO TBCR
40.0000 meq | EXTENDED_RELEASE_TABLET | Freq: Once | ORAL | Status: AC
  Administered 2020-05-28: 40 meq via ORAL
  Filled 2020-05-28: qty 2

## 2020-05-28 MED ORDER — PANTOPRAZOLE SODIUM 40 MG PO TBEC
40.0000 mg | DELAYED_RELEASE_TABLET | Freq: Two times a day (BID) | ORAL | Status: DC
Start: 2020-05-28 — End: 2020-05-29
  Administered 2020-05-28 – 2020-05-29 (×3): 40 mg via ORAL
  Filled 2020-05-28 (×3): qty 1

## 2020-05-28 MED ORDER — TRAZODONE HCL 50 MG PO TABS
50.0000 mg | ORAL_TABLET | Freq: Every day | ORAL | Status: DC
  Administered 2020-05-28: 50 mg via ORAL
  Filled 2020-05-28: qty 1

## 2020-05-28 MED ORDER — APIXABAN 5 MG PO TABS
5.0000 mg | ORAL_TABLET | Freq: Two times a day (BID) | ORAL | Status: DC
  Administered 2020-05-28 – 2020-05-29 (×2): 5 mg via ORAL
  Filled 2020-05-28 (×2): qty 1

## 2020-05-28 MED ORDER — FUROSEMIDE 10 MG/ML IJ SOLN
20.0000 mg | Freq: Once | INTRAMUSCULAR | Status: AC
  Administered 2020-05-28: 20 mg via INTRAVENOUS
  Filled 2020-05-28: qty 2

## 2020-05-28 NOTE — Evaluation (Signed)
Occupational Therapy Evaluation Patient Details Name: [name] MRN: [id number] DOB: 12/31/1945 Today's Date: 05/28/2020    History of Present Illness 75 y.o. 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   Clinical Impression   Patient resides at [name], reports mod I at baseline with self care and mobility. Participates in HHPT/OT. Patient appears at his baseline, performed sink side g/h, transferred to toilet, performed peri care and able to doff shoes seated EOB without any physical assistance. Mild unsteadiness noted with ambulation, pt did hold onto end of bed while ambulating to/from bathroom, declined use of RW. Pt reports supposed to D/C back to [name] tomorrow, no further acute OT needs at this time, will sign off. Please re-consult if new needs arise.    Follow Up Recommendations  Home health OT;Other (comment) (continue with HH at [name])    Equipment Recommendations  None recommended by OT       Precautions / Restrictions Restrictions Weight Bearing Restrictions: No      Mobility Bed Mobility Overal bed mobility: Modified Independent             General bed mobility comments: sit to supine    Transfers Overall transfer level: Modified independent Equipment used: None             General transfer comment: patient does hold onto furniture when walking to bathroom, no loss of balance noted during session, declined use of walker    Balance Overall balance assessment: Mild deficits observed, not formally tested                                         ADL either performed or assessed with clinical judgement   ADL Overall ADL's : At baseline                                        General ADL Comments: patient able to perform g/h at sink, transfer on/off toilet and doff shoes without physical assistance, no loss of balance just increased time                  Pertinent Vitals/Pain Pain Assessment: No/denies pain     Hand Dominance Right   Extremity/Trunk Assessment Upper Extremity Assessment Upper Extremity Assessment: RUE deficits/detail;LUE deficits/detail RUE Deficits / Details: shoulder flexion ~ 90 degrees LUE Deficits / Details: shoulder flexion ~50 degrees   Lower Extremity Assessment Lower Extremity Assessment: Defer to PT evaluation       Communication Communication Communication: No difficulties   Cognition Arousal/Alertness: Awake/alert Behavior During Therapy: WFL for tasks assessed/performed Overall Cognitive Status: Within Functional Limits for tasks assessed                                 General Comments: A/O x4, following all directions appropriately              Home Living Family/patient expects to be discharged to:: Assisted living ([name])  Home Equipment: None          Prior Functioning/Environment Level of Independence: Independent        Comments: getting HH PT/OT        OT Problem List: Decreased activity tolerance         OT Goals(Current goals can be found in the care plan section) Acute Rehab OT Goals Patient Stated Goal: back to [name] tomorrow OT Goal Formulation: All assessment and education complete, DC therapy   AM-PAC OT "6 Clicks" Daily Activity     Outcome Measure Help from another person eating meals?: None Help from another person taking care of personal grooming?: None Help from another person toileting, which includes using toliet, bedpan, or urinal?: None Help from another person bathing (including washing, rinsing, drying)?: None Help from another person to put on and taking  off regular upper body clothing?: None Help from another person to put on and taking off regular lower body clothing?: None 6 Click Score: 24   End of Session Nurse Communication: Mobility status  Activity Tolerance: Patient tolerated treatment well Patient left: in bed;with call bell/phone within reach;with bed alarm set  OT Visit Diagnosis: Other abnormalities of gait and mobility (R26.89)                Time: 0962-8366 OT Time Calculation (min): 24 min Charges:  OT General Charges $OT Visit: 1 Visit OT Evaluation $OT Eval Low Complexity: 1 Low  [name] OT OT pager: [phone]  [name] 05/28/2020, 2:58 PM

## 2020-05-28 NOTE — Discharge Instructions (Addendum)
Acute Respiratory Failure, Adult Acute respiratory failure is a condition that is a medical emergency. It can develop quickly, and it should be treated right away. There are two types of acute respiratory failure:  Type I respiratory failure is when the lungs are not able to get enough oxygen into the blood. This causes the blood oxygen level to drop.  Type II respiratory failure is when carbon dioxide is not passing from the lungs out of the body. This causes carbon dioxide to build up in the blood. A person may have one type of acute respiratory failure or have both types at the same time. What are the causes? Common causes of type I respiratory failure include:  Trauma to the lung, chest, ribs, or tissues around the lung.  Pneumonia.  Lung diseases, such as pulmonary fibrosis or asthma.  Smoke, chemical, or water inhalation.  A blood clot in the lungs (pulmonary embolism).  A blood infection (sepsis).  Heart attack. Common causes of type II respiratory failure include:  Stroke.  A spinal cord injury.  A drug or alcohol overdose.  A blood infection (sepsis).  Cardiac arrest. What increases the risk? This condition is more likely to develop in people who have:  Lung diseases such as asthma or chronic obstructive pulmonary disease (COPD).  A condition that damages or weakens the muscles, nerves, bones, or tissues that are involved in breathing, such as myasthenia gravis or Guillain-Barr syndrome.  A serious infection.  A health problem that blocks the unconscious reflex that is involved in breathing, such as hypothyroidism or sleep apnea. What are the signs or symptoms? Trouble breathing is the main symptom of acute respiratory failure. Symptoms may also include:  Fast breathing.  Restlessness or anxiety.  Breathing loudly (wheezing) and grunting.  Fast or irregular heartbeats (palpitations).  Confusion or changes in behavior.  Feeling tired (fatigue),  sleeping more than normal, or being hard to wake.  Skin, lips, or fingernails that appear blue (cyanosis). How is this diagnosed? This condition may be diagnosed based on:  Your medical history and a physical exam. Your health care provider will listen to your heart and lungs to check for abnormal sounds.  Tests to confirm the diagnosis and determine the cause of respiratory failure. These tests may include: ? Measuring the amount of oxygen in your blood (pulse oximetry). The measurement comes from a small device that is placed on your finger, earlobe, or toe. ? Blood tests to measure blood oxygen and carbon dioxide and to look for signs of infection. ? Tests on a sample of the fluid that surrounds the spinal cord (cerebrospinal fluid) or a sample of fluid that is drawn from the windpipe (trachea) to check for infections. ? Chest X-ray. ? Electrocardiogram (ECG) to look at the heart's electrical activity.   How is this treated? Treatment for this condition usually takes place in a hospital intensive care unit (ICU). Treatment depends on what is causing the condition. It may include one or more of these treatments:  Oxygen may be given through your nose or a face mask.  A device such as a continuous positive airway pressure (CPAP) machine or bi-level positive airway pressure (BPAP) machine may be used to help you breathe. The device gives you oxygen and pressure.  Breathing treatments, fluids, and other medicines may be given.  A ventilator may be used to help you breathe. The machine gives you oxygen and pressure. A tube is put into your mouth and trachea to connect  the ventilator. ? If this treatment is needed longer term, a tracheostomy may be placed. A tracheostomy is a breathing tube put through your neck into your trachea.  In extreme cases, extracorporeal life support (ECLS) may be used. This treatment temporarily takes over the function of the heart and lungs, supplying oxygen and  removing carbon dioxide. ECLS gives the lungs a chance to recover. Follow these instructions at home: Medicines  Take over-the-counter and prescription medicines only as told by your health care provider.  If you were prescribed an antibiotic medicine, take it as told by your health care provider. Do not stop using the antibiotic even if you start to feel better.  If you are taking blood thinners: ? Talk with your health care provider before you take any medicines that contain aspirin or NSAIDs, such as ibuprofen. These medicines increase your risk for dangerous bleeding. ? Take your medicine exactly as told, at the same time every day. ? Avoid activities that could cause injury or bruising, and follow instructions about how to prevent falls. ? Wear a medical alert bracelet or carry a card that lists what medicines you take. General instructions  Return to your normal activities as told by your health care provider. Ask your health care provider what activities are safe for you.  Do not use any products that contain nicotine or tobacco, such as cigarettes, e-cigarettes, and chewing tobacco. If you need help quitting, ask your health care provider.  Do not drink alcohol if: ? Your health care provider tells you not to drink. ? You are pregnant, may be pregnant, or are planning to become pregnant.  Wear compression stockings as told by your health care provider. These stockings help to prevent blood clots and reduce swelling in your legs.  Attend any physical therapy and pulmonary rehabilitation as told by your health care provider.  Keep all follow-up visits as told by your health care provider. This is important. How is this prevented?  If you have an infection or a medical condition that may lead to acute respiratory failure, make sure you get proper treatment. Contact a health care provider if:  You have a fever.  Your symptoms do not improve or they get worse. Get help right  away if:  You are having trouble breathing.  You lose consciousness.  You develop a fast heart rate.  Your fingers, lips, or other areas turn blue.  You are confused. These symptoms may represent a serious problem that is an emergency. Do not wait to see if the symptoms will go away. Get medical help right away. Call your local emergency services (911 in the [place] Do not drive yourself to the hospital. Summary  Acute respiratory failure is a medical emergency. It can develop quickly, and it should be treated right away.  Treatment for this condition usually takes place in a hospital intensive care unit (ICU). Treatment may include oxygen, fluids, and medicines. A device may be used to help you breathe, such as a ventilator.  Take over-the-counter and prescription medicines only as told by your health care provider.  Contact a health care provider if your symptoms do not improve or if they get worse. This information is not intended to replace advice given to you by your health care provider. Make sure you discuss any questions you have with your health care provider. Document Revised: 03/12/2019 Document Reviewed: 03/12/2019 Elsevier Patient Education  [place] on my medicine - ELIQUIS (apixaban)   Why was  Eliquis prescribed for you? Eliquis was prescribed to treat blood clots that may have been found in the veins of your legs (deep vein thrombosis) or in your lungs (pulmonary embolism) and to reduce the risk of them occurring again.  What do You need to know about Eliquis ? Per your doctor in the hospital, your dose will be ONE 5 mg tablet taken TWICE daily.  Eliquis may be taken with or without food.   Try to take the dose about the same time in the morning and in the evening. If you have difficulty swallowing the tablet whole please discuss with your pharmacist how to take the medication safely.  Take Eliquis exactly as prescribed and DO NOT stop  taking Eliquis without talking to the doctor who prescribed the medication.  Stopping may increase your risk of developing a new blood clot.  Refill your prescription before you run out.  After discharge, you should have regular check-up appointments with your healthcare provider that is prescribing your Eliquis.    What do you do if you miss a dose? If a dose of ELIQUIS is not taken at the scheduled time, take it as soon as possible on the same day and twice-daily administration should be resumed. The dose should not be doubled to make up for a missed dose.  Important Safety Information A possible side effect of Eliquis is bleeding. You should call your healthcare provider right away if you experience any of the following: ? Bleeding from an injury or your nose that does not stop. ? Unusual colored urine (red or dark brown) or unusual colored stools (red or black). ? Unusual bruising for unknown reasons. ? A serious fall or if you hit your head (even if there is no bleeding).  Some medicines may interact with Eliquis and might increase your risk of bleeding or clotting while on Eliquis. To help avoid this, consult your healthcare provider or pharmacist prior to using any new prescription or non-prescription medications, including herbals, vitamins, non-steroidal anti-inflammatory drugs (NSAIDs) and supplements.  This website has more information on Eliquis (apixaban): http://[email or web address].com/eliquis/home

## 2020-05-28 NOTE — Progress Notes (Signed)
PROGRESS NOTE    Patient: [name]                            PCP: Cassandria Anger, MD                    DOB: [birth year]            DOA: 05/26/2020 GMW:[id number]             DOS: 05/28/2020, 10:48 AM   LOS: 0 days   Date of Service: The patient was seen and examined on 05/28/2020  Subjective:   The patient was seen and examined this morning. Per nursing staff his mentation waxes and wanes dramatically.  This morning he was telling me he did not sleep overnight and he was walking most of the night nurses confirmed that he was not walking.  After extensive conversation with me, he called back to the desk stating that he did not talk to me..  Per nursing staff yesterday he developed RTacute bloody eye which has not progressed  Today.   Brief Narrative:   [name] is a 75 y.o. 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  Patient initially seen in oncology office 05/25/2020 and noted to have drop in hemoglobin from baseline 12 down to 8.7.  He did not have any obvious bleeding.  Labs also showed new AKI with creatinine 2.32 compared to normal renal function.   He came to the [facility] on 05/26/2020 to receive 2 unit PRBC transfusion.   On arrival he was noted to be hypoxic with SPO2 in the high 70s.  He became pale and diaphoretic and had a drop in blood pressure.  He was placed on 2 L O2 via [place] and taken down to the [facility] ED while blood was transfusing through his Port-A-Cath.   [facility] ED Course:  Initial vitals showed BP 93/60, pulse 95, RR 18, temp 98.5 F, SPO2 93% on room air.  Labs notable for hemoglobin improved to 9.7, creatinine improving  to 1.52, sodium 125 near recent baseline,BUN 54, high-sensitivity troponin I negative x2.  FOBT negative.  SARS-CoV-2 PCR is negative.  Portable chest x-ray shows Port-A-Cath in place without acute disease.   Chronic elevation of right hemidiaphragm noted.  CTA chest PE study shows a small nonocclusive subsegmental right upper lobe PE.   Chronic elevation right hemidiaphragm with partial right lower lobe consolidation seen which appears chronic.  Patient was given 1 L normal saline, albuterol inhaler, and Tylenol.    Assessment & Plan:   Principal Problem:   Acute respiratory failure with hypoxia (HCC) Active Problems:   Essential hypertension   Coronary atherosclerosis   COPD (chronic obstructive pulmonary disease) (HCC)   Chronic pain syndrome   Hyponatremia   Symptomatic anemia   Pulmonary embolus (HCC)    Confusion /altered mental status  -Per nursing staff and observation, mental status waxed and wanes -At times he is alert oriented x4 then becomes confused no agitation -It is thought to be multifactorial possibly transient hypoxia, no signs of infection -Monitor electrolytes -We will continue neurochecks   acute respiratory failure with hypoxia: -  Much improved has been weaned off supplemental oxygen, currently satting greater 92% on room air -Likely multifactorial, anemia, atelectasis, small subsegmental PE on CTA  (lesion is too small to be affecting respiratory status) -Incentive spirometer, albuterol as needed -Patient is oncology has started him on Eliquis  05/27/2020  Symptomatic anemia: S/p 2 unit PRBC transfusion prior to transfer to inpatient bed.  Hemoglobin improving.  Hemoglobin office and in ED negative -He has been on Plavix, he was started on Eliquis 05/27/2020 -Monitoring H&H: 9.7, 9.4, 9.2  Acute right eye conjunctival hemorrhage -Visual acuity intact -Just started on Eliquis 10 mg twice daily >> will taper down to 5 mg twice daily      Small subsegmental right upper lobe PE /bilateral DVT Seen on CTA as above.   -Discussed patient's oncologist.  Dr. Marin Olp has started the patient on Eliquis -Due to symptomatic anemia, acute right hemorrhagic, multiple [name] is high risk for bleed Discussed with pharmacy we will reduce Eliquis from 10 to 5 mg p.o. twice daily    AKI: -Improved 2.32 >> 1.52 >> 0.93 >> 0.82 Likely primarily due to GI losses from recent gastroenteritis.    Status post gentle IV fluids  -Holding home HCTZ, losartan, torsemide.  CAD s/p PCI: Chronic and stable, denies any chest pain.  Plavix currently on hold.  COPD: -  Continue albuterol as needed. -On room air, no signs of exacerbation, satting greater 92%  Hypertension -hypotensive Resume home Lopressor, -We will continue to hold, anticipating discontinuing  HCTZ and losartan.  Hyperlipidemia: Continue statin.  Chronic hyponatremia: -Exacerbated by dehydration, sodium 126, 125, 134 today -Responded to IV fluid hydration  -Continue to hold home HCTZ and torsemide.  Anxiety: Continue home Xanax as needed with hold parameters.  Chronic back pain: Continue home Dilaudid with hold parameters in prednisone 20 mg daily.  Hypokalemia -Potassium 3.1, 3.2 >>> monitoring and repleting  Debility/generalized weaknesses -Consulting PT OT for evaluation recommendation   DVT prophylaxis: SCDs only for now Code Status: DNR, confirmed with patient Family Communication: Discussed with patient Disposition Plan: From [facility] Consults called: None  Admission status:  Status is: Observation Level of care: Telemetry  The patient remains OBS appropriate and will d/c before 2 midnights. Patient will remain in hospital due to generalized weaknesses, confusion, initiation of anticoagulation in the setting of anemia--- monitor H&H, for next 24 hours   Dispo: The patient is from: [facility]   Anticipated d/c is to: Previous environment  Anticipated d/c date is: 1 day  Patient currently is not medically stable to d/c.              Difficult to place patient No   --------------------------------------------------------------------------------------------------------------------------------- Nutritional status:  The patient's BMI is: Body mass index is 24.23 kg/m. I agree with the assessment and plan as outlined ---     Procedures:   No admission procedures for hospital encounter.     Antimicrobials:  Anti-infectives (From admission, onward)   None       Medication:  . apixaban  10 mg Oral BID   Followed by  . [START ON 06/03/2020] apixaban  5 mg Oral BID  . Chlorhexidine Gluconate Cloth  6 each Topical Daily  . FLUoxetine  40 mg Oral Daily  . gabapentin  300 mg Oral BID  . metoprolol tartrate  25 mg Oral BID  . pantoprazole  40 mg Oral BID  . pravastatin  20 mg Oral Daily  . predniSONE  20 mg  Oral Q breakfast    acetaminophen **OR** acetaminophen, albuterol, ALPRAZolam, HYDROmorphone, ondansetron **OR** ondansetron (ZOFRAN) IV, polyvinyl alcohol, sodium chloride flush   Objective:   Vitals:   05/27/20 1234 05/27/20 2133 05/28/20 0550 05/28/20 1007  BP: 128/63 108/77 (!) 146/77 (!) 157/92  Pulse: 66 74 73 77  Resp: 18  19 18   Temp: 98.2 F (36.8 C) 97.9 F (36.6 C) 98.2 F (36.8 C) 98.3 F (36.8 C)  TempSrc: Oral Oral Oral Oral  SpO2: 91% 100% (!) 89% 93%  Weight:      Height:        Intake/Output Summary (Last 24 hours) at 05/28/2020 1048 Last data filed at 05/28/2020 1020 Gross per 24 hour  Intake 320 ml  Output 925 ml  Net -605 ml   Filed Weights   05/26/20 1348  Weight: 68.1 kg     Examination:   Physical Exam  Constitution:  Alert, cooperative, no distress,  Appears calm and comfortable  Psychiatric: Normal and stable mood and affect, cognition intact,   HEENT: Right eye conjunctival hemorrhage,  visual acuity intact  normocephalic, PERRL, otherwise with in Normal limits  Chest:Chest symmetric Cardio vascular:  S1/S2, RRR, No murmure, No Rubs or Gallops  pulmonary: Clear to auscultation bilaterally, respirations unlabored, negative wheezes / crackles Abdomen: Soft, non-tender, non-distended, bowel sounds,no masses, no organomegaly Muscular skeletal:  Generalized weaknesses Limited exam - in bed, able to move all 4 extremities, Normal strength,  Neuro: CNII-XII intact. , normal motor and sensation, reflexes intact  Extremities: No pitting edema lower extremities, +2 pulses  Skin: Dry, warm to touch, negative for any Rashes, No open wounds Wounds: per nursing documentation    ------------------------------------------------------------------------------------------------------------------------------------    LABs:  CBC Latest Ref Rng & Units 05/28/2020 05/27/2020 05/26/2020  WBC 4.0 - 10.5 K/uL 7.9 6.7 9.6  Hemoglobin 13.0 - 17.0 g/dL 9.2(L) 9.4(L) 9.7(L)  Hematocrit 39.0 - 52.0 % 28.7(L) 29.3(L) 29.5(L)  Platelets 150 - 400 K/uL 317 348 356   CMP Latest Ref Rng & Units 05/28/2020 05/27/2020 05/26/2020  Glucose 70 - 99 mg/dL 82 87 196(H)  BUN 8 - 23 mg/dL 22 33(H) 54(H)  Creatinine 0.61 - 1.24 mg/dL 0.82 0.93 1.52(H)  Sodium 135 - 145 mmol/L 134(L) 134(L) 125(L)  Potassium 3.5 - 5.1 mmol/L 3.2(L) 3.1(L) 3.8  Chloride 98 - 111 mmol/L 96(L) 96(L) 89(L)  CO2 22 - 32 mmol/L 30 30 26   Calcium 8.9 - 10.3 mg/dL 7.7(L) 7.5(L) 7.7(L)  Total Protein 6.5 - 8.1 g/dL 5.2(L) - 5.8(L)  Total Bilirubin 0.3 - 1.2 mg/dL 1.4(H) - 2.1(H)  Alkaline Phos 38 - 126 U/L 37(L) - 46  AST 15 - 41 U/L 11(L) - 14(L)  ALT 0 - 44 U/L 10 - 10       Micro Results Recent Results (from the past 240 hour(s))  SARS Coronavirus 2 by RT PCR (hospital order, performed in [facility] hospital lab)     Status: None   Collection Time: 05/26/20  2:38 PM  Result Value Ref Range Status   SARS Coronavirus 2 NEGATIVE  NEGATIVE Final    Comment: (NOTE) SARS-CoV-2 target nucleic acids are NOT DETECTED.  The SARS-CoV-2 RNA is generally detectable in upper and lower respiratory specimens during the acute phase of infection. The lowest concentration of SARS-CoV-2 viral copies this assay can detect is 250 copies / mL. A negative result does not preclude SARS-CoV-2 infection and should not be used as the sole basis for treatment or other patient management  decisions.  A negative result may occur with improper specimen collection / handling, submission of specimen other than nasopharyngeal swab, presence of viral mutation(s) within the areas targeted by this assay, and inadequate number of viral copies (<250 copies / mL). A negative result must be combined with clinical observations, patient history, and epidemiological information.  Fact Sheet for Patients:   StrictlyIdeas.no  Fact Sheet for Healthcare Providers: BankingDealers.co.za  This test is not yet approved or  cleared by the [place] FDA and has been authorized for detection and/or diagnosis of SARS-CoV-2 by FDA under an Emergency Use Authorization (EUA).  This EUA will remain in effect (meaning this test can be used) for the duration of the COVID-19 declaration under Section 564(b)(1) of the Act, 21 U.S.C. section 360bbb-3(b)(1), unless the authorization is terminated or revoked sooner.  Performed at [facility], [address] [place], [place] [address]     Radiology Reports CT HEAD WO CONTRAST  Result Date: 05/25/2020 CLINICAL DATA:  Dizziness, history of melanoma EXAM: CT HEAD WITHOUT CONTRAST TECHNIQUE: Contiguous axial images were obtained from the base of the skull through the vertex without intravenous contrast. COMPARISON:  2018 FINDINGS: Brain: There is no acute intracranial hemorrhage, mass effect, or edema. Gray-white differentiation is preserved. There is no  extra-axial fluid collection. Prominence of the ventricles and sulci reflects mild generalized volume. Patchy hypoattenuation in the supratentorial white matter is nonspecific but probably reflects mild chronic microvascular ischemic changes. Vascular: There is atherosclerotic calcification at the skull base. Skull: Calvarium is unremarkable. Sinuses/Orbits: No acute finding. Other: None. IMPRESSION: No acute intracranial abnormality. Similar chronic microvascular ischemic changes. Electronically Signed   By: Macy Mis M.D.   On: 05/25/2020 10:33   CT Angio Chest PE W and/or Wo Contrast  Result Date: 05/26/2020 CLINICAL DATA:  Low hemoglobin confusion EXAM: CT ANGIOGRAPHY CHEST WITH CONTRAST TECHNIQUE: Multidetector CT imaging of the chest was performed using the standard protocol during bolus administration of intravenous contrast. Multiplanar CT image reconstructions and MIPs were obtained to evaluate the vascular anatomy. CONTRAST:  157m OMNIPAQUE IOHEXOL 350 MG/ML SOLN COMPARISON:  Chest x-ray 05/26/2020, CT 10/14/2017 FINDINGS: Cardiovascular: Satisfactory opacification of the pulmonary arteries to the segmental level. Small nonocclusive filling defect within subsegmental right upper lobe pulmonary vessel, series 8, image number 80. No other definitive filling defects are visualized. Nonaneurysmal aorta. Moderate aortic atherosclerosis. Coronary vascular calcification. Normal cardiac size. No pericardial effusion. Left-sided central venous port with tip in the SVC. Mediastinum/Nodes: Midline trachea. No thyroid mass. No suspicious nodes. Esophagus within normal limits. Lungs/Pleura: No pleural effusion or pneumothorax. Chronic elevation right diaphragm. Partial consolidation right lower lobe slightly increased. Upper Abdomen: No acute abnormality. Incompletely visualized right hepatic cysts. Musculoskeletal: Degenerative changes. Anterolisthesis C7 on T1. Partially visualized lower spinal hardware. No  acute osseous abnormality Review of the MIP images confirms the above findings. IMPRESSION: 1. Small nonocclusive filling defect within a subsegmental right upper lobe pulmonary vessel, consistent with small pulmonary embolus. 2. Chronic elevation right diaphragm with partial right lower lobe consolidation, some of which appears chronic. Acute process such as pneumonia superimposed on chronic atelectasis could be considered. 3. Aortic atherosclerosis. Aortic Atherosclerosis (ICD10-I70.0). Critical Value/emergent results were called by telephone at the time of interpretation on 05/26/2020 at 6:08 pm [facility] who verbally acknowledged these results. Electronically Signed   By: KDonavan FoilM.D.   On: 05/26/2020 18:09   DG Chest Portable 1 View  Result Date: 05/26/2020 CLINICAL DATA:  Shortness of breath. EXAM: PORTABLE CHEST 1 VIEW COMPARISON:  PA and lateral chest 07/02/2017. FINDINGS: Lung volumes are low and there is chronic elevation of the right hemidiaphragm. No consolidative process, pneumothorax or effusion. Heart size is upper normal. Bilateral shoulder replacements and spinal fusion hardware again seen. Port-A-Cath remains in place. IMPRESSION: No acute disease. Electronically Signed   By: Inge Rise M.D.   On: 05/26/2020 14:52   VAS [place] LOWER EXTREMITY VENOUS (DVT)  Result Date: 05/27/2020  Lower Venous DVT Study Indications: Pulmonary embolism.  Risk Factors: Confirmed PE. Limitations: Poor ultrasound/tissue interface and patient positioning. Comparison Study: No prior studies. Performing Technologist: Oliver Hum RVT  Examination Guidelines: A complete evaluation includes B-mode imaging, spectral Doppler, color Doppler, and power Doppler as needed of all accessible portions of each vessel. Bilateral testing is considered an integral part of a complete examination. Limited examinations for reoccurring indications may be performed as noted. The reflux portion of the  exam is performed with the patient in reverse Trendelenburg.  +---------+---------------+---------+-----------+----------+-------------------+ RIGHT    CompressibilityPhasicitySpontaneityPropertiesThrombus Aging      +---------+---------------+---------+-----------+----------+-------------------+ CFV      Full           Yes      Yes                                      +---------+---------------+---------+-----------+----------+-------------------+ SFJ      Full                                                             +---------+---------------+---------+-----------+----------+-------------------+ FV Prox  Full                                                             +---------+---------------+---------+-----------+----------+-------------------+ FV Mid   Full                                                             +---------+---------------+---------+-----------+----------+-------------------+ FV DistalFull                                                             +---------+---------------+---------+-----------+----------+-------------------+ PFV      Full                                                             +---------+---------------+---------+-----------+----------+-------------------+ POP      Full           Yes  Yes                                      +---------+---------------+---------+-----------+----------+-------------------+ PTV      Full                                                             +---------+---------------+---------+-----------+----------+-------------------+ PERO                                                  Not well visualized +---------+---------------+---------+-----------+----------+-------------------+ Gastroc  Partial                                      Acute               +---------+---------------+---------+-----------+----------+-------------------+    +---------+---------------+---------+-----------+----------+--------------+ LEFT     CompressibilityPhasicitySpontaneityPropertiesThrombus Aging +---------+---------------+---------+-----------+----------+--------------+ CFV      Full           Yes      Yes                                 +---------+---------------+---------+-----------+----------+--------------+ SFJ      Full                                                        +---------+---------------+---------+-----------+----------+--------------+ FV Prox  Full                                                        +---------+---------------+---------+-----------+----------+--------------+ FV Mid   Full                                                        +---------+---------------+---------+-----------+----------+--------------+ FV DistalFull                                                        +---------+---------------+---------+-----------+----------+--------------+ PFV      Full                                                        +---------+---------------+---------+-----------+----------+--------------+ POP      Full  Yes      Yes                                 +---------+---------------+---------+-----------+----------+--------------+ PTV      Full                                                        +---------+---------------+---------+-----------+----------+--------------+ PERO     Full                                                        +---------+---------------+---------+-----------+----------+--------------+ Gastroc  Partial                                      Acute          +---------+---------------+---------+-----------+----------+--------------+     Summary: RIGHT: - Findings consistent with acute deep vein thrombosis involving the right gastrocnemius veins. - No cystic structure found in the popliteal fossa.  LEFT: - Findings consistent with  acute deep vein thrombosis involving the left gastrocnemius veins. - No cystic structure found in the popliteal fossa. - There is an area of mixed echogenicity located in the groin measuring 1.4cm high by 2.4cm wide by 1.6cm long.  *See table(s) above for measurements and observations.    Preliminary     SIGNED: Deatra James, MD, FHM. [facility],  Pager (please use [email or web address] to page/text) Please use Epic Secure Chat for non-urgent communication (7AM-7PM)  If 7PM-7AM, please contact night-coverage [email or web address], 05/28/2020, 10:48 AM

## 2020-05-28 NOTE — Progress Notes (Addendum)
[place] for Apixaban Indication: VTE  Allergies  Allergen Reactions  . Cefepime Hives and Shortness Of Breath  . Cephaeline Hives  . Morphine Swelling    A swollen stomach.  . Morphine And Related Shortness Of Breath, Nausea And Vomiting, Swelling and Other (See Comments)    Agitation, tolerates dilaudid Other reaction(s): Other (See Comments) Agitation, tolerates dilaudid  . Penicillins Hives, Shortness Of Breath and Rash    Has patient had a PCN reaction causing immediate rash, facial/tongue/throat swelling, SOB or lightheadedness with hypotension: Yes Has patient had a PCN reaction causing severe rash involving mucus membranes or skin necrosis: Yes Has patient had a PCN reaction that required hospitalization No Has patient had a PCN reaction occurring within the last 10 years: No If all of the above answers are "NO", then may proceed with Cephalosporin use.   [name] Doxycycline Rash  . Oxycodone-Acetaminophen Other (See Comments)    Patient doesn't remember what type of reaction.    Patient Measurements: Height: 5' 6"  (167.6 cm) Weight: 68.1 kg (150 lb 2.1 oz) IBW/kg (Calculated) : 63.8  Vital Signs: Temp: 98.3 F (36.8 C) (02/20 1007) Temp Source: Oral (02/20 1007) BP: 157/92 (02/20 1007) Pulse Rate: 77 (02/20 1007)  Labs: Recent Labs    05/26/20 1438 05/26/20 1714 05/27/20 0912 05/28/20 0414  HGB 9.7*  --  9.4* 9.2*  HCT 29.5*  --  29.3* 28.7*  PLT 356  --  348 317  CREATININE 1.52*  --  0.93 0.82  TROPONINIHS 11 10  --   --     Estimated Creatinine Clearance: 72.4 mL/min (by C-G formula based on SCr of 0.82 mg/dL).  Assessment: Patient is a 75 y.o M with hx hemochromatosis, melanoma of the left thigh, and iron deficiency anemia presented to the ED on 2/18 with c/o SOB.  Chest CTA on 2/18 showed small PE in RUL. Bilateral lower extremity venous duplex on 2/19 + for bilateral DVT. Pharmacy consulted 2/19 to start Apixaban  for VTE. Patient was started on Apixaban 41m PO BID x 7 days, then 572mPO BID thereafter.   Today, 05/28/2020: - CBC: Hgb 9.2, low but stable. Pltc WNL - SCr 0.82 - FOBT negative on 2/18   Plan:  - Given symptomatic anemia requiring recent transfusion, small size of PE, acute/new hemorrhage in right eye, and high risk of bleed, Dr. ShRoger Shelterequests to forego remainder of higher dose of Apixaban 106mID for the first week of therapy. Therefore, Apixaban dose has been changed to 5mg66m BID at MD request.  - Monitor CBC and for s/sx bleeding   JignLindell SpararmD, BCPS 05/28/2020,10:51 AM

## 2020-05-28 NOTE — Evaluation (Signed)
Physical Therapy Evaluation Patient Details Name: [name] MRN: [id number] DOB: 09/12/1945 Today's Date: 05/28/2020   History of Present Illness  75 y.o. 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  Clinical Impression  Pt admitted as above and presenting with functional mobility limitations 2* generalized weakness and ambulatory balance deficits.  Pt very motivated and should progress well to return to prior living arrangement at [place].  Pt states he was going to PT at [facility] prior to this admit and he would like to continue with same following return home.    Follow Up Recommendations Other (comment);Outpatient PT (Pt currently going to OP PT/OT at [facility] and would like to continue.)    Equipment Recommendations  None recommended by PT    Recommendations for Other Services       Precautions / Restrictions Precautions Precautions: Fall Restrictions Weight Bearing Restrictions: No      Mobility  Bed Mobility Overal bed mobility: Modified Independent             General bed mobility comments: Pt up on BSC on arrival and up in recliner at session end    Transfers Overall transfer level: Needs assistance Equipment used: None Transfers: Sit to/from Stand Sit to Stand: Min guard         General transfer comment: Steady assist with increased BOS to move to standing  Ambulation/Gait Ambulation/Gait assistance: Min assist;Min guard Gait Distance (Feet): 300 Feet Assistive device: Rolling walker (2 wheeled);1 person hand held assist Gait Pattern/deviations: Step-through pattern;Decreased step length - right;Decreased step length - left;Shuffle;Trunk flexed;Wide base of  support Gait velocity: decr   General Gait Details: Pt ambulated limited distance with HHA but with noted increased BOS and instability requiring min steady assist and moved to use of RW allowing ambulation with min guard /sup assist  Stairs            Wheelchair Mobility    Modified Rankin (Stroke Patients Only)       Balance Overall balance assessment: Needs assistance Sitting-balance support: No upper extremity supported;Feet supported Sitting balance-Leahy Scale: Good     Standing balance support: No upper extremity supported Standing balance-Leahy Scale: Fair                               Pertinent Vitals/Pain Pain Assessment: No/denies pain    Home Living Family/patient expects to be discharged to:: Assisted living               Home Equipment: Walker - 2 wheels      Prior Function Level of Independence: Independent         Comments: Pt is currently recieving HH PT and OT at [facility] Hand: Right    Extremity/Trunk Assessment   Upper Extremity Assessment Upper Extremity Assessment: Defer to OT evaluation RUE Deficits / Details: shoulder flexion ~ 90 degrees LUE Deficits / Details: shoulder flexion ~50 degrees    Lower Extremity Assessment Lower Extremity Assessment: Generalized weakness    Cervical / Trunk Assessment Cervical / Trunk Assessment: Kyphotic  Communication   Communication: No difficulties  Cognition Arousal/Alertness: Awake/alert Behavior During Therapy: WFL for tasks assessed/performed  Overall Cognitive Status: Within Functional Limits for tasks assessed                                 General Comments: A/O x4, following all directions appropriately      General Comments      Exercises     Assessment/Plan    PT Assessment Patient needs continued PT services  PT Problem List Decreased strength;Decreased activity tolerance;Decreased range of  motion;Decreased mobility;Pain;Decreased balance       PT Treatment Interventions DME instruction;Gait training;Functional mobility training;Therapeutic activities;Therapeutic exercise;Patient/family education    PT Goals (Current goals can be found in the Care Plan section)  Acute Rehab PT Goals Patient Stated Goal: back to ALF tomorrow PT Goal Formulation: With patient Time For Goal Achievement: 06/11/20 Potential to Achieve Goals: Good    Frequency Min 3X/week   Barriers to discharge        Co-evaluation               AM-PAC PT "6 Clicks" Mobility  Outcome Measure Help needed turning from your back to your side while in a flat bed without using bedrails?: None Help needed moving from lying on your back to sitting on the side of a flat bed without using bedrails?: None Help needed moving to and from a bed to a chair (including a wheelchair)?: A Little Help needed standing up from a chair using your arms (e.g., wheelchair or bedside chair)?: A Little Help needed to walk in hospital room?: A Little Help needed climbing 3-5 steps with a railing? : A Little 6 Click Score: 20    End of Session Equipment Utilized During Treatment: Gait belt Activity Tolerance: Patient tolerated treatment well Patient left: in chair;with call bell/phone within reach;with chair alarm set Nurse Communication: Mobility status PT Visit Diagnosis: Difficulty in walking, not elsewhere classified (R26.2);Muscle weakness (generalized) (M62.81)    Time: 2458-0998 PT Time Calculation (min) (ACUTE ONLY): 30 min   Charges:   PT Evaluation $PT Eval Low Complexity: 1 Low PT Treatments $Gait Training: 8-22 mins        [place] Pager [phone] Office [phone]   [facility] 05/28/2020, 4:48 PM

## 2020-05-29 DIAGNOSIS — I82403 Acute embolism and thrombosis of unspecified deep veins of lower extremity, bilateral: Secondary | ICD-10-CM | POA: Diagnosis not present

## 2020-05-29 DIAGNOSIS — Z7901 Long term (current) use of anticoagulants: Secondary | ICD-10-CM | POA: Diagnosis not present

## 2020-05-29 DIAGNOSIS — J9601 Acute respiratory failure with hypoxia: Secondary | ICD-10-CM | POA: Diagnosis not present

## 2020-05-29 DIAGNOSIS — I2699 Other pulmonary embolism without acute cor pulmonale: Secondary | ICD-10-CM | POA: Diagnosis not present

## 2020-05-29 DIAGNOSIS — D649 Anemia, unspecified: Secondary | ICD-10-CM | POA: Diagnosis not present

## 2020-05-29 DIAGNOSIS — Z85820 Personal history of malignant melanoma of skin: Secondary | ICD-10-CM | POA: Diagnosis not present

## 2020-05-29 LAB — CBC WITH DIFFERENTIAL/PLATELET
Abs Immature Granulocytes: 0.05 10*3/uL (ref 0.00–0.07)
Basophils Absolute: 0 10*3/uL (ref 0.0–0.1)
Basophils Relative: 0 %
Eosinophils Absolute: 0 10*3/uL (ref 0.0–0.5)
Eosinophils Relative: 0 %
HCT: 30.3 % — ABNORMAL LOW (ref 39.0–52.0)
Hemoglobin: 9.7 g/dL — ABNORMAL LOW (ref 13.0–17.0)
Immature Granulocytes: 1 %
Lymphocytes Relative: 20 %
Lymphs Abs: 1.7 10*3/uL (ref 0.7–4.0)
MCH: 30.9 pg (ref 26.0–34.0)
MCHC: 32 g/dL (ref 30.0–36.0)
MCV: 96.5 fL (ref 80.0–100.0)
Monocytes Absolute: 0.9 10*3/uL (ref 0.1–1.0)
Monocytes Relative: 11 %
Neutro Abs: 5.9 10*3/uL (ref 1.7–7.7)
Neutrophils Relative %: 68 %
Platelets: 339 10*3/uL (ref 150–400)
RBC: 3.14 MIL/uL — ABNORMAL LOW (ref 4.22–5.81)
RDW: 14.1 % (ref 11.5–15.5)
WBC: 8.6 10*3/uL (ref 4.0–10.5)
nRBC: 0 % (ref 0.0–0.2)

## 2020-05-29 LAB — COMPREHENSIVE METABOLIC PANEL
ALT: 10 U/L (ref 0–44)
AST: 13 U/L — ABNORMAL LOW (ref 15–41)
Albumin: 2.9 g/dL — ABNORMAL LOW (ref 3.5–5.0)
Alkaline Phosphatase: 37 U/L — ABNORMAL LOW (ref 38–126)
Anion gap: 7 (ref 5–15)
BUN: 15 mg/dL (ref 8–23)
CO2: 32 mmol/L (ref 22–32)
Calcium: 7.9 mg/dL — ABNORMAL LOW (ref 8.9–10.3)
Chloride: 93 mmol/L — ABNORMAL LOW (ref 98–111)
Creatinine, Ser: 0.87 mg/dL (ref 0.61–1.24)
GFR, Estimated: >60 mL/min (ref >60)
Glucose, Bld: 76 mg/dL (ref 70–99)
Potassium: 3.7 mmol/L (ref 3.5–5.1)
Sodium: 132 mmol/L — ABNORMAL LOW (ref 135–145)
Total Bilirubin: 1.3 mg/dL — ABNORMAL HIGH (ref 0.3–1.2)
Total Protein: 5.2 g/dL — ABNORMAL LOW (ref 6.5–8.1)

## 2020-05-29 MED ORDER — APIXABAN 5 MG PO TABS
5.0000 mg | ORAL_TABLET | Freq: Two times a day (BID) | ORAL | 0 refills | Status: DC
Start: 2020-05-29 — End: 2020-06-06

## 2020-05-29 MED ORDER — HEPARIN SOD (PORK) LOCK FLUSH 100 UNIT/ML IV SOLN
500.0000 [IU] | INTRAVENOUS | Status: AC | PRN
  Administered 2020-05-29: 500 [IU]
  Filled 2020-05-29: qty 5

## 2020-05-29 NOTE — Discharge Summary (Signed)
Physician Discharge Summary [facility]    Patient: [name]                   Admit date: 05/26/2020   DOB: April 15, 1945             Discharge date:05/29/2020/11:27 AM TRR:[id number]                          PCP: John Anger, MD  Disposition: HOME with Home Health   Recommendations for Outpatient Follow-up:   . Follow up: in 1 week  Discharge Condition: Stable   Code Status:   Code Status: DNR  Diet recommendation: Regular healthy diet   Discharge Diagnoses:    Principal Problem:   Acute respiratory failure with hypoxia ([place]) Active Problems:   Essential hypertension   Coronary atherosclerosis   COPD (chronic obstructive pulmonary disease) (HCC)   Chronic pain syndrome   Hyponatremia   Symptomatic anemia   Pulmonary embolus (HCC)   History of Present Illness/ Hospital Course [name] Summary:   John Gu Thompsonis a 75 y.o.malewith medical history significant forCAD s/p PCI on Plavix, COPD, HTN, HLD, hemochromatosis, iron deficiency anemia, 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  Patient initially seen in oncology office 05/25/2020 and noted to have drop in hemoglobin from baseline 12 down to 8.7. He did not have any obvious bleeding.  Labs also showed new AKI with creatinine 2.32 compared to normal renal function.  He came to the [facility] on 05/26/2020 to receive 2 unit PRBC transfusion.  On arrival he was noted to be hypoxic with SPO2 in the high 70s. He became pale and diaphoretic and had a drop in blood pressure. He was placed on 2 L O2 via [place] and taken down to the [facility] ED while blood was transfusing through his Port[facility]A-Cath.   [facility] Initial vitals showed BP 93/60, pulse 95, RR 18, temp 98.5 F, SPO2 93% on room  air.  Labs notable for hemoglobin improved to 9.7, creatinine improving to 1.52, sodium 125 near recent baseline,BUN 54, high-sensitivity troponin I negative x2. FOBT negative.  SARS-CoV-2 PCR is negative.  Portable chest x-ray shows Port-A-Cath in place without acute disease.  Chronic elevation of right hemidiaphragm noted.  CTA chest PE study shows a small nonocclusive subsegmental right upper lobe PE.  Chronic elevation right hemidiaphragm with partial right lower lobe consolidation seen which appears chronic.  Patient was given 1 L normal saline, albuterol inhaler, and Tylenol.  Confusion /altered mental status  -Per nursing staff and observation, mental status waxed and wanes -At times he is alert oriented x4 then becomes confused no agitation -It is thought to be multifactorial possibly transient hypoxia, no signs of infection -Monitor electrolytes -We will continue neurochecks   Acute respiratory failure with hypoxia: Resolved -Much improved has been weaned off supplemental oxygen, currently satting greater 92% on room air -Likely multifactorial, anemia, atelectasis, small subsegmental PE on CTA  (lesion is too small to be affecting respiratory status) -Incentive spirometer, albuterol as needed -Patient is oncology has started him on Eliquis  05/27/2020  Symptomatic anemia: S/p 2 unit PRBC transfusion prior to transfer to inpatient bed. Hemoglobin improving.  Hemoglobin office and in ED negative -He has been on Plavix, he was started on Eliquis 05/27/2020 -  Monitoring H&H: 9.7, 9.4, 9.2, 9.7 today  Acute right eye conjunctival hemorrhage -Hemorrhage has improved -Visual acuity intact ---- -Just started on Eliquis 10 mg twice daily >>  was tapered down to 5 mg twice daily     Small subsegmental right upper lobe PE /bilateral DVT Seen on CTA as above.  -Discussed patient's oncologist.  [name] has started the patient on Eliquis -Due to symptomatic  anemia, acute right hemorrhagic, multiple [name] is high risk for bleed Discussed with pharmacy we will reduce Eliquis from 10 to 5 mg p.o. twice daily    AKI: -Improved 2.32 >> 1.52 >> 0.93 >> 0.82 >> 0.87 Likely primarily due to GI losses from recent gastroenteritis.   Status post gentle IV fluids  -Holding home HCTZ, losartan, torsemide.  CAD s/p PCI: Chronic and stable, denies any chest pain. Plavix currently on hold.  COPD: - Continue albuterol as needed. -On room air, no signs of exacerbation, satting greater 92%  Hypertension -hypotensive Resume home Lopressor, -Discontinued hold,   HCTZ and losartan.  Hyperlipidemia: Continue statin.  Chronic hyponatremia: -Exacerbated by dehydration, sodium 126, 125, 134, 132  today -Responded to IV fluid hydration -Continue to hold home HCTZ and torsemide.  Anxiety: Continue home Xanax as needed with hold parameters.  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  Hypokalemia -Potassium 3.1, 3.2 >>> monitoring and repleting  Debility/generalized weaknesses -Consulting PT OT for evaluation recommendation   DVT prophylaxis:SCDs only for now Code Status:DNR, confirmed with patient Family Communication:Discussed with patient Disposition Plan:From [facility] Consults called:Oncologist   Dispo: The patient is from:[facility] Anticipated d/c is IB:[id number] environment        Discharge Instructions:   Discharge Instructions    Activity as tolerated - No restrictions   Complete by: As directed    Diet - low sodium heart healthy   Complete by: As directed    Discharge instructions   Complete by: As directed    Follow with oncologist closely   Discharge wound care:   Complete by: As directed    Per instructions   Increase activity slowly   Complete by: As directed        Medication List    STOP  taking these medications   clopidogrel 75 MG tablet Commonly known as: PLAVIX   hydrochlorothiazide 12.5 MG capsule Commonly known as: Microzide   losartan 50 MG tablet Commonly known as: COZAAR   ondansetron 8 MG tablet Commonly known as: ZOFRAN   prochlorperazine 10 MG tablet Commonly known as: COMPAZINE     TAKE these medications   albuterol 108 (90 Base) MCG/ACT inhaler Commonly known as: VENTOLIN HFA Inhale 1-2 puffs into the lungs every 4 (four) hours as needed for wheezing or shortness of breath.   ALPRAZolam 0.5 MG tablet Commonly known as: XANAX Take 1 tablet (0.5 mg total) by mouth 3 (three) times daily as needed. for anxiety   apixaban 5 MG Tabs tablet Commonly known as: ELIQUIS Take 1 tablet (5 mg total) by mouth 2 (two) times daily.   B-6 PO Take 1 tablet by mouth.   FLUoxetine 40 MG capsule Commonly known as: PROZAC Take 40 mg by mouth daily.   fluticasone 50 MCG/ACT nasal spray Commonly known as: FLONASE INSTILL 2 SPRAYS IN EACH NOSTRIL EVERY DAY   gabapentin 300 MG capsule Commonly known as: NEURONTIN TAKE 1 CAPSULE(300 MG) BY MOUTH TWICE DAILY What changed: See the new instructions.   HYDROmorphone 2  MG tablet Commonly known as: DILAUDID Take 1-1.5 tablets (2-3 mg total) by mouth every 8 (eight) hours as needed for severe pain.   HYDROmorphone 2 MG tablet Commonly known as: DILAUDID Take 1 tablet (2 mg total) by mouth every 8 (eight) hours as needed for severe pain.   lidocaine-prilocaine cream Commonly known as: EMLA Apply 1 application topically as needed.   melatonin 5 MG Tabs Take 5 mg by mouth at bedtime. Takes 10 mg at HS   metoprolol tartrate 25 MG tablet Commonly known as: LOPRESSOR TAKE 1 TABLET (25 MG TOTAL) BY MOUTH 2 (TWO) TIMES DAILY.   pantoprazole 40 MG tablet Commonly known as: PROTONIX TAKE 1 TABLET (40 MG TOTAL) BY MOUTH EVERY EVENING. What changed: additional instructions   predniSONE 10 MG tablet Commonly  known as: DELTASONE 20 mg po qam pc or as directed What changed:   how much to take  how to take this  when to take this  additional instructions  Another medication with the same name was removed. Continue taking this medication, and follow the directions you see here.   torsemide 10 MG tablet Commonly known as: DEMADEX TAKE 1 TO 2 TABLETS(10 TO 20 MG) BY MOUTH DAILY What changed: See the new instructions.   traZODone 100 MG tablet Commonly known as: DESYREL TAKE 1 TABLET AT BEDTIME   vitamin B-12 1000 MCG tablet Commonly known as: CYANOCOBALAMIN Take 1,000 mcg by mouth daily.   Vitamin D3 50 MCG (2000 UT) capsule Take 1 capsule (2,000 Units total) by mouth daily.       Allergies  Allergen Reactions  . Cefepime Hives and Shortness Of Breath  . Cephaeline Hives  . Morphine Swelling    A swollen stomach.  . Morphine And Related Shortness Of Breath, Nausea And Vomiting, Swelling and Other (See Comments)    Agitation, tolerates dilaudid Other reaction(s): Other (See Comments) Agitation, tolerates dilaudid  . Penicillins Hives, Shortness Of Breath and Rash    Has patient had a PCN reaction causing immediate rash, facial/tongue/throat swelling, SOB or lightheadedness with hypotension: Yes Has patient had a PCN reaction causing severe rash involving mucus membranes or skin necrosis: Yes Has patient had a PCN reaction that required hospitalization No Has patient had a PCN reaction occurring within the last 10 years: No If all of the above answers are "NO", then may proceed with Cephalosporin use.   [name] Doxycycline Rash  . Oxycodone-Acetaminophen Other (See Comments)    Patient doesn't remember what type of reaction.     Procedures /Studies:   CT HEAD WO CONTRAST  Result Date: 05/25/2020 CLINICAL DATA:  Dizziness, history of melanoma EXAM: CT HEAD WITHOUT CONTRAST TECHNIQUE: Contiguous axial images were obtained from the base of the skull through the vertex without  intravenous contrast. COMPARISON:  2018 FINDINGS: Brain: There is no acute intracranial hemorrhage, mass effect, or edema. Gray-white differentiation is preserved. There is no extra-axial fluid collection. Prominence of the ventricles and sulci reflects mild generalized volume. Patchy hypoattenuation in the supratentorial white matter is nonspecific but probably reflects mild chronic microvascular ischemic changes. Vascular: There is atherosclerotic calcification at the skull base. Skull: Calvarium is unremarkable. Sinuses/Orbits: No acute finding. Other: None. IMPRESSION: No acute intracranial abnormality. Similar chronic microvascular ischemic changes. Electronically Signed   By: Macy Mis M.D.   On: 05/25/2020 10:33   CT Angio Chest PE W and/or Wo Contrast  Result Date: 05/26/2020 CLINICAL DATA:  Low hemoglobin confusion EXAM: CT ANGIOGRAPHY CHEST WITH CONTRAST TECHNIQUE:  Multidetector CT imaging of the chest was performed using the standard protocol during bolus administration of intravenous contrast. Multiplanar CT image reconstructions and MIPs were obtained to evaluate the vascular anatomy. CONTRAST:  183m OMNIPAQUE IOHEXOL 350 MG/ML SOLN COMPARISON:  Chest x-ray 05/26/2020, CT 10/14/2017 FINDINGS: Cardiovascular: Satisfactory opacification of the pulmonary arteries to the segmental level. Small nonocclusive filling defect within subsegmental right upper lobe pulmonary vessel, series 8, image number 80. No other definitive filling defects are visualized. Nonaneurysmal aorta. Moderate aortic atherosclerosis. Coronary vascular calcification. Normal cardiac size. No pericardial effusion. Left-sided central venous port with tip in the SVC. Mediastinum/Nodes: Midline trachea. No thyroid mass. No suspicious nodes. Esophagus within normal limits. Lungs/Pleura: No pleural effusion or pneumothorax. Chronic elevation right diaphragm. Partial consolidation right lower lobe slightly increased. Upper Abdomen: No  acute abnormality. Incompletely visualized right hepatic cysts. Musculoskeletal: Degenerative changes. Anterolisthesis C7 on T1. Partially visualized lower spinal hardware. No acute osseous abnormality Review of the MIP images confirms the above findings. IMPRESSION: 1. Small nonocclusive filling defect within a subsegmental right upper lobe pulmonary vessel, consistent with small pulmonary embolus. 2. Chronic elevation right diaphragm with partial right lower lobe consolidation, some of which appears chronic. Acute process such as pneumonia superimposed on chronic atelectasis could be considered. 3. Aortic atherosclerosis. Aortic Atherosclerosis (ICD10-I70.0). Critical Value/emergent results were called by telephone at the time of interpretation on 05/26/2020 at 6:08 pm [facility] who verbally acknowledged these results. Electronically Signed   By: KDonavan FoilM.D.   On: 05/26/2020 18:09   DG Chest Portable 1 View  Result Date: 05/26/2020 CLINICAL DATA:  Shortness of breath. EXAM: PORTABLE CHEST 1 VIEW COMPARISON:  PA and lateral chest 07/02/2017. FINDINGS: Lung volumes are low and there is chronic elevation of the right hemidiaphragm. No consolidative process, pneumothorax or effusion. Heart size is upper normal. Bilateral shoulder replacements and spinal fusion hardware again seen. Port-A-Cath remains in place. IMPRESSION: No acute disease. Electronically Signed   By: TInge RiseM.D.   On: 05/26/2020 14:52   VAS [place]OWER EXTREMITY VENOUS (DVT)  Result Date: 05/27/2020  Lower Venous DVT Study Indications: Pulmonary embolism.  Risk Factors: Confirmed PE. Limitations: Poor ultrasound/tissue interface and patient positioning. Comparison Study: No prior studies. Performing Technologist: GOliver HumRVT  Examination Guidelines: A complete evaluation includes B-mode imaging, spectral Doppler, color Doppler, and power Doppler as needed of all accessible portions of each vessel.  Bilateral testing is considered an integral part of a complete examination. Limited examinations for reoccurring indications may be performed as noted. The reflux portion of the exam is performed with the patient in reverse Trendelenburg.    Summary: RIGHT: - Findings consistent with acute deep vein thrombosis involving the right gastrocnemius veins. - No cystic structure found in the popliteal fossa.  LEFT: - Findings consistent with acute deep vein thrombosis involving the left gastrocnemius veins. - No cystic structure found in the popliteal fossa. - There is an area of mixed echogenicity located in the groin measuring 1.4cm high by 2.4cm wide by 1.6cm long.  *See table(s) above for measurements and observations.    Preliminary      Subjective:   Patient was seen and examined 05/29/2020, 11:27 AM Patient stable today. No acute distress.  No issues overnight Stable for discharge.  Discharge Exam:    Vitals:   05/28/20 0550 05/28/20 1007 05/28/20 2200 05/29/20 0609  BP: (!) 146/77 (!) 157/92 127/76 135/84  Pulse: 73 77 69 75  Resp: 19 18  20 20  Temp: 98.2 F (36.8 C) 98.3 F (36.8 C) 98.3 F (36.8 C) 98.3 F (36.8 C)  TempSrc: Oral Oral Oral Oral  SpO2: (!) 89% 93% 94% 91%  Weight:      Height:        General: Pt lying comfortably in bed & appears in no obvious distress. Cardiovascular: S1 & S2 heard, RRR, S1/S2 +. No murmurs, rubs, gallops or clicks. No JVD or pedal edema. Respiratory: Clear to auscultation without wheezing, rhonchi or crackles. No increased work of breathing. Abdominal:  Non-distended, non-tender & soft. No organomegaly or masses appreciated. Normal bowel sounds heard. CNS: Alert and oriented. No focal deficits. Extremities: no edema, no cyanosis      The results of significant diagnostics from this hospitalization (including imaging, microbiology, ancillary and laboratory) are listed below for reference.      Microbiology:   Recent Results (from the  past 240 hour(s))  SARS Coronavirus 2 by RT PCR (hospital order, performed in [place] hospital lab)     Status: None   Collection Time: 05/26/20  2:38 PM  Result Value Ref Range Status   SARS Coronavirus 2 NEGATIVE NEGATIVE Final    Comment: (NOTE) SARS-CoV-2 target nucleic acids are NOT DETECTED.  The SARS-CoV-2 RNA is generally detectable in upper and lower respiratory specimens during the acute phase of infection. The lowest concentration of SARS-CoV-2 viral copies this assay can detect is 250 copies / mL. A negative result does not preclude SARS-CoV-2 infection and should not be used as the sole basis for treatment or other patient management decisions.  A negative result may occur with improper specimen collection / handling, submission of specimen other than nasopharyngeal swab, presence of viral mutation(s) within the areas targeted by this assay, and inadequate number of viral copies (<250 copies / mL). A negative result must be combined with clinical observations, patient history, and epidemiological information.  Fact Sheet for Patients:   StrictlyIdeas.no  Fact Sheet for Healthcare Providers: BankingDealers.co.za  This test is not yet approved or  cleared by the [place] FDA and has been authorized for detection and/or diagnosis of SARS-CoV-2 by FDA under an Emergency Use Authorization (EUA).  This EUA will remain in effect (meaning this test can be used) for the duration of the COVID-19 declaration under Section 564(b)(1) of the Act, 21 U.S.C. section 360bbb-3(b)(1), unless the authorization is terminated or revoked sooner.  Performed at [facility], [address] [place], [place] [address]      Labs:   CBC: Recent Labs  Lab 05/25/20 0912 05/26/20 1438 05/27/20 0912 05/28/20 0414 05/29/20 0421  WBC 9.3 9.6 6.7 7.9 8.6  NEUTROABS 6.2 8.7*  --  5.6 5.9  HGB 8.7* 9.7* 9.4* 9.2* 9.7*  HCT  26.6* 29.5* 29.3* 28.7* 30.3*  MCV 94.0 94.2 96.7 96.6 96.5  PLT 363 356 348 317 707   Basic Metabolic Panel: Recent Labs  Lab 05/25/20 0912 05/26/20 1438 05/27/20 0912 05/28/20 0414 05/29/20 0421  NA 126* 125* 134* 134* 132*  K 3.6 3.8 3.1* 3.2* 3.7  CL 87* 89* 96* 96* 93*  CO2 32 26 30 30  32  GLUCOSE 94 196* 87 82 76  BUN 74* 54* 33* 22 15  CREATININE 2.32* 1.52* 0.93 0.82 0.87  CALCIUM 8.2* 7.7* 7.5* 7.7* 7.9*  MG  --   --   --  1.9  --    Liver Function Tests: Recent Labs  Lab 05/25/20 0912 05/26/20 1438  05/28/20 0414 05/29/20 0421  AST 11* 14* 11* 13*  ALT 8 10 10 10   ALKPHOS 41 46 37* 37*  BILITOT 1.0 2.1* 1.4* 1.3*  PROT 5.9* 5.8* 5.2* 5.2*  ALBUMIN 3.6 3.2* 2.8* 2.9*   Anemia work up Recent Labs    05/27/20 0912  TIBC 187*  IRON 81   Urinalysis    Component Value Date/Time   COLORURINE YELLOW 05/26/2020 [place] 05/26/2020 1404   LABSPEC 1.010 05/26/2020 1404   PHURINE 5.0 05/26/2020 1404   GLUCOSEU NEGATIVE 05/26/2020 1404   GLUCOSEU NEGATIVE 03/10/2015 1008   HGBUR NEGATIVE 05/26/2020 1404   BILIRUBINUR NEGATIVE 05/26/2020 1404   KETONESUR NEGATIVE 05/26/2020 1404   PROTEINUR NEGATIVE 05/26/2020 1404   UROBILINOGEN 0.2 03/10/2015 1008   NITRITE NEGATIVE 05/26/2020 1404   LEUKOCYTESUR NEGATIVE 05/26/2020 1404         Time coordinating discharge: Over 45 minutes  SIGNED: Deatra James, MD, FACP, FHM. [facility],  Please use [email or web address] to Page If 7PM-7AM, please contact night-coverage [email or web address] Ota [facility] 05/29/2020, 11:27 AM

## 2020-05-29 NOTE — Progress Notes (Signed)
[name] does have bilateral lower extremity thromboembolic issues.  We will get Dopplers on Saturday.  He has blood clots in both lower legs.  He is on Eliquis.  His labs today show white count 8.6.  Hemoglobin 9.7.  Platelet count 339,000.  His BUN is 15 creatinine 0.87.  This is back to normal after hydration.  His albumin is quite low at 2.9.  Calcium is 7.9.  He is not complaining of any pain.  His appetite I guess is pretty good.  He is not having any diarrhea from what I can tell.  Physical therapy and Occupational Therapy are working on him.  I think this is a fantastic idea.  He has had no fever.  He has had no cough.  On the lower extremity ultrasound, I am not sure what the finding is on the left side about "mixed echogenicity" in the groin.  I do not know if this is a thrombus or if this is something else.  I will know we need to consider any kind of scan.  His vital signs show temperature 98.3.  Pulse 75.  Blood pressure 135/84.  His lungs sound clear.  Cardiac exam regular rate and rhythm.  He may have extra beat.  Abdominal exam is soft.  Bowel sounds are active.  There is no guarding or rebound tenderness.  I really cannot palpate anything in the left inguinal area.  Neurological exam shows no focal neurological deficits.  [name] came in with marked dehydration.  He had renal insufficiency secondary to dehydration.  This is improving.  He had thromboembolic disease in his lungs and legs.  He is now on Eliquis.  Hopefully, he will be able to go home soon.  He would like to go home.  I am not sure how much therapy he will need when he gets home with respect to strengthening.  I know that he is getting wonderful care from all the staff on [address].  [name], MD  2 Cor 6:3

## 2020-05-29 NOTE — Plan of Care (Signed)

## 2020-05-30 ENCOUNTER — Telehealth: Payer: Self-pay

## 2020-05-30 ENCOUNTER — Other Ambulatory Visit: Payer: Self-pay | Admitting: Hematology & Oncology

## 2020-05-30 DIAGNOSIS — R296 Repeated falls: Secondary | ICD-10-CM | POA: Diagnosis not present

## 2020-05-30 DIAGNOSIS — R2681 Unsteadiness on feet: Secondary | ICD-10-CM | POA: Diagnosis not present

## 2020-05-30 DIAGNOSIS — R262 Difficulty in walking, not elsewhere classified: Secondary | ICD-10-CM | POA: Diagnosis not present

## 2020-05-30 DIAGNOSIS — M6281 Muscle weakness (generalized): Secondary | ICD-10-CM | POA: Diagnosis not present

## 2020-05-30 DIAGNOSIS — J449 Chronic obstructive pulmonary disease, unspecified: Secondary | ICD-10-CM | POA: Diagnosis not present

## 2020-05-30 NOTE — Telephone Encounter (Signed)
Patient called stating he was confused about the eliquis and if this was something he needed to continue long term because it was [place] cost $500 and he cant afford that. Called patient back and informed him that he needs to take his eliquis and provided him with assistance programs numbers to hopefully lower his cost. Informed him to let us know if we can help in any way. patient verbalized understanding and denies any other questions or concerns at this time.    Eliquis copay [phone]   bristol myers [phone]

## 2020-05-31 DIAGNOSIS — R296 Repeated falls: Secondary | ICD-10-CM | POA: Diagnosis not present

## 2020-05-31 DIAGNOSIS — M6281 Muscle weakness (generalized): Secondary | ICD-10-CM | POA: Diagnosis not present

## 2020-05-31 DIAGNOSIS — R262 Difficulty in walking, not elsewhere classified: Secondary | ICD-10-CM | POA: Diagnosis not present

## 2020-05-31 DIAGNOSIS — R2681 Unsteadiness on feet: Secondary | ICD-10-CM | POA: Diagnosis not present

## 2020-05-31 DIAGNOSIS — J449 Chronic obstructive pulmonary disease, unspecified: Secondary | ICD-10-CM | POA: Diagnosis not present

## 2020-06-01 DIAGNOSIS — M6281 Muscle weakness (generalized): Secondary | ICD-10-CM | POA: Diagnosis not present

## 2020-06-01 DIAGNOSIS — R2681 Unsteadiness on feet: Secondary | ICD-10-CM | POA: Diagnosis not present

## 2020-06-01 DIAGNOSIS — R296 Repeated falls: Secondary | ICD-10-CM | POA: Diagnosis not present

## 2020-06-01 DIAGNOSIS — J449 Chronic obstructive pulmonary disease, unspecified: Secondary | ICD-10-CM | POA: Diagnosis not present

## 2020-06-01 DIAGNOSIS — R262 Difficulty in walking, not elsewhere classified: Secondary | ICD-10-CM | POA: Diagnosis not present

## 2020-06-02 ENCOUNTER — Telehealth: Payer: Self-pay | Admitting: *Deleted

## 2020-06-02 DIAGNOSIS — R262 Difficulty in walking, not elsewhere classified: Secondary | ICD-10-CM | POA: Diagnosis not present

## 2020-06-02 DIAGNOSIS — M6281 Muscle weakness (generalized): Secondary | ICD-10-CM | POA: Diagnosis not present

## 2020-06-02 DIAGNOSIS — R296 Repeated falls: Secondary | ICD-10-CM | POA: Diagnosis not present

## 2020-06-02 DIAGNOSIS — R2681 Unsteadiness on feet: Secondary | ICD-10-CM | POA: Diagnosis not present

## 2020-06-02 DIAGNOSIS — J449 Chronic obstructive pulmonary disease, unspecified: Secondary | ICD-10-CM | POA: Diagnosis not present

## 2020-06-02 NOTE — Telephone Encounter (Signed)
Message received from patient requesting that prescription for Eliquis be sent to [place] d/t increased cost through his insurance.  Call placed back to patient and patient notified per order of Dr. Marin Olp that a prescription for Eliquis can not be sent to [place] and to contact his insurance company to see what cheaper alternatives are available for him for Eliquis.  Pt is appreciative of call back and states that he will contact his insurance companies.

## 2020-06-05 DIAGNOSIS — J449 Chronic obstructive pulmonary disease, unspecified: Secondary | ICD-10-CM | POA: Diagnosis not present

## 2020-06-05 DIAGNOSIS — R262 Difficulty in walking, not elsewhere classified: Secondary | ICD-10-CM | POA: Diagnosis not present

## 2020-06-05 DIAGNOSIS — M6281 Muscle weakness (generalized): Secondary | ICD-10-CM | POA: Diagnosis not present

## 2020-06-05 DIAGNOSIS — R2681 Unsteadiness on feet: Secondary | ICD-10-CM | POA: Diagnosis not present

## 2020-06-05 DIAGNOSIS — R296 Repeated falls: Secondary | ICD-10-CM | POA: Diagnosis not present

## 2020-06-06 ENCOUNTER — Other Ambulatory Visit: Payer: Self-pay | Admitting: *Deleted

## 2020-06-06 DIAGNOSIS — R296 Repeated falls: Secondary | ICD-10-CM | POA: Diagnosis not present

## 2020-06-06 DIAGNOSIS — I2699 Other pulmonary embolism without acute cor pulmonale: Secondary | ICD-10-CM

## 2020-06-06 DIAGNOSIS — I82409 Acute embolism and thrombosis of unspecified deep veins of unspecified lower extremity: Secondary | ICD-10-CM

## 2020-06-06 DIAGNOSIS — R262 Difficulty in walking, not elsewhere classified: Secondary | ICD-10-CM | POA: Diagnosis not present

## 2020-06-06 DIAGNOSIS — R2681 Unsteadiness on feet: Secondary | ICD-10-CM | POA: Diagnosis not present

## 2020-06-06 DIAGNOSIS — J449 Chronic obstructive pulmonary disease, unspecified: Secondary | ICD-10-CM | POA: Diagnosis not present

## 2020-06-06 DIAGNOSIS — M6281 Muscle weakness (generalized): Secondary | ICD-10-CM | POA: Diagnosis not present

## 2020-06-06 HISTORY — DX: Other pulmonary embolism without acute cor pulmonale: I26.99

## 2020-06-06 HISTORY — DX: Acute embolism and thrombosis of unspecified deep veins of unspecified lower extremity: I82.409

## 2020-06-06 MED ORDER — GABAPENTIN 300 MG PO CAPS: 600.0000 mg | ORAL_CAPSULE | Freq: Every day | ORAL | 2 refills | Status: DC

## 2020-06-06 MED ORDER — APIXABAN 5 MG PO TABS: 5.0000 mg | ORAL_TABLET | Freq: Two times a day (BID) | ORAL | 2 refills | Status: AC

## 2020-06-06 MED ORDER — FLUOXETINE HCL 40 MG PO CAPS: 40.0000 mg | ORAL_CAPSULE | Freq: Every day | ORAL | 2 refills | Status: DC

## 2020-06-07 ENCOUNTER — Inpatient Hospital Stay: Admit: 2020-06-07 | Payer: Medicare Other | Admitting: Neurosurgery

## 2020-06-07 DIAGNOSIS — M6281 Muscle weakness (generalized): Secondary | ICD-10-CM | POA: Diagnosis not present

## 2020-06-07 DIAGNOSIS — R296 Repeated falls: Secondary | ICD-10-CM | POA: Diagnosis not present

## 2020-06-07 DIAGNOSIS — R2681 Unsteadiness on feet: Secondary | ICD-10-CM | POA: Diagnosis not present

## 2020-06-07 DIAGNOSIS — R262 Difficulty in walking, not elsewhere classified: Secondary | ICD-10-CM | POA: Diagnosis not present

## 2020-06-07 DIAGNOSIS — J449 Chronic obstructive pulmonary disease, unspecified: Secondary | ICD-10-CM | POA: Diagnosis not present

## 2020-06-07 SURGERY — LAMINECTOMY WITH POSTERIOR LATERAL ARTHRODESIS LEVEL 4
Anesthesia: General

## 2020-06-08 DIAGNOSIS — R296 Repeated falls: Secondary | ICD-10-CM | POA: Diagnosis not present

## 2020-06-08 DIAGNOSIS — J449 Chronic obstructive pulmonary disease, unspecified: Secondary | ICD-10-CM | POA: Diagnosis not present

## 2020-06-08 DIAGNOSIS — M6281 Muscle weakness (generalized): Secondary | ICD-10-CM | POA: Diagnosis not present

## 2020-06-08 DIAGNOSIS — R262 Difficulty in walking, not elsewhere classified: Secondary | ICD-10-CM | POA: Diagnosis not present

## 2020-06-08 DIAGNOSIS — R2681 Unsteadiness on feet: Secondary | ICD-10-CM | POA: Diagnosis not present

## 2020-06-09 ENCOUNTER — Telehealth: Payer: Self-pay | Admitting: *Deleted

## 2020-06-09 DIAGNOSIS — R296 Repeated falls: Secondary | ICD-10-CM | POA: Diagnosis not present

## 2020-06-09 DIAGNOSIS — R262 Difficulty in walking, not elsewhere classified: Secondary | ICD-10-CM | POA: Diagnosis not present

## 2020-06-09 DIAGNOSIS — M6281 Muscle weakness (generalized): Secondary | ICD-10-CM | POA: Diagnosis not present

## 2020-06-09 DIAGNOSIS — R2681 Unsteadiness on feet: Secondary | ICD-10-CM | POA: Diagnosis not present

## 2020-06-09 DIAGNOSIS — J449 Chronic obstructive pulmonary disease, unspecified: Secondary | ICD-10-CM | POA: Diagnosis not present

## 2020-06-09 NOTE — Telephone Encounter (Signed)
Received a call from patient stating that he had an emergency BM last night in the middle of the night.  He experienced black tarry stools.  He states that he hasn't had nausea or vomiting or any bleeding in his urine.  This episode was the only time this happened.  Dr Marin Olp notified.  Wants patient to stop Eliquis for 3 days, However if this episode happens again he needs to go straight to the ED.  Patient understands instructions and appreciative of call.

## 2020-06-11 MED ORDER — ALPRAZOLAM 0.5 MG PO TABS: 0.5000 mg | ORAL_TABLET | Freq: Three times a day (TID) | ORAL | 1 refills | Status: DC | PRN

## 2020-06-12 ENCOUNTER — Telehealth: Payer: Self-pay | Admitting: Internal Medicine

## 2020-06-12 NOTE — Telephone Encounter (Signed)
Ennis Forts w/ [facility] called and was wondering if any faxes have been received for the patients home health orders. She can be reached at [phone]. Please advise

## 2020-06-12 NOTE — Telephone Encounter (Signed)
    Forms to be refaxed today The request is for order to treat DVT, edema PT eval and treat  Please call Ennis Forts at [phone]

## 2020-06-13 NOTE — Telephone Encounter (Signed)
[name] called again and said that if verbals can be given over the phone to please call [place] [facility]- [phone]

## 2020-06-13 NOTE — Telephone Encounter (Signed)
Notified [place] w/MD response.Marland KitchenJohny [name]

## 2020-06-13 NOTE — Telephone Encounter (Signed)
Rec'd fax from [facility] for services requested below.John KitchenJohny Chess

## 2020-06-13 NOTE — Telephone Encounter (Signed)
OK. Thx

## 2020-06-14 DIAGNOSIS — I10 Essential (primary) hypertension: Secondary | ICD-10-CM | POA: Diagnosis not present

## 2020-06-14 DIAGNOSIS — E785 Hyperlipidemia, unspecified: Secondary | ICD-10-CM | POA: Diagnosis not present

## 2020-06-14 DIAGNOSIS — H1131 Conjunctival hemorrhage, right eye: Secondary | ICD-10-CM | POA: Diagnosis not present

## 2020-06-14 DIAGNOSIS — I2699 Other pulmonary embolism without acute cor pulmonale: Secondary | ICD-10-CM | POA: Diagnosis not present

## 2020-06-14 DIAGNOSIS — Z832 Family history of diseases of the blood and blood-forming organs and certain disorders involving the immune mechanism: Secondary | ICD-10-CM | POA: Diagnosis not present

## 2020-06-14 DIAGNOSIS — G894 Chronic pain syndrome: Secondary | ICD-10-CM | POA: Diagnosis not present

## 2020-06-14 DIAGNOSIS — F419 Anxiety disorder, unspecified: Secondary | ICD-10-CM | POA: Diagnosis not present

## 2020-06-14 DIAGNOSIS — I2510 Atherosclerotic heart disease of native coronary artery without angina pectoris: Secondary | ICD-10-CM | POA: Diagnosis not present

## 2020-06-14 DIAGNOSIS — D509 Iron deficiency anemia, unspecified: Secondary | ICD-10-CM | POA: Diagnosis not present

## 2020-06-14 DIAGNOSIS — Z7952 Long term (current) use of systemic steroids: Secondary | ICD-10-CM | POA: Diagnosis not present

## 2020-06-14 DIAGNOSIS — E871 Hypo-osmolality and hyponatremia: Secondary | ICD-10-CM | POA: Diagnosis not present

## 2020-06-14 DIAGNOSIS — Z85820 Personal history of malignant melanoma of skin: Secondary | ICD-10-CM | POA: Diagnosis not present

## 2020-06-14 DIAGNOSIS — Z8379 Family history of other diseases of the digestive system: Secondary | ICD-10-CM | POA: Diagnosis not present

## 2020-06-14 DIAGNOSIS — I82461 Acute embolism and thrombosis of right calf muscular vein: Secondary | ICD-10-CM | POA: Diagnosis not present

## 2020-06-14 DIAGNOSIS — I82462 Acute embolism and thrombosis of left calf muscular vein: Secondary | ICD-10-CM | POA: Diagnosis not present

## 2020-06-14 DIAGNOSIS — Z7901 Long term (current) use of anticoagulants: Secondary | ICD-10-CM | POA: Diagnosis not present

## 2020-06-14 DIAGNOSIS — E876 Hypokalemia: Secondary | ICD-10-CM | POA: Diagnosis not present

## 2020-06-14 DIAGNOSIS — M549 Dorsalgia, unspecified: Secondary | ICD-10-CM | POA: Diagnosis not present

## 2020-06-14 DIAGNOSIS — M069 Rheumatoid arthritis, unspecified: Secondary | ICD-10-CM | POA: Diagnosis not present

## 2020-06-14 DIAGNOSIS — J449 Chronic obstructive pulmonary disease, unspecified: Secondary | ICD-10-CM | POA: Diagnosis not present

## 2020-06-15 ENCOUNTER — Telehealth: Payer: Self-pay | Admitting: Internal Medicine

## 2020-06-15 DIAGNOSIS — I82461 Acute embolism and thrombosis of right calf muscular vein: Secondary | ICD-10-CM | POA: Diagnosis not present

## 2020-06-15 DIAGNOSIS — I2510 Atherosclerotic heart disease of native coronary artery without angina pectoris: Secondary | ICD-10-CM | POA: Diagnosis not present

## 2020-06-15 DIAGNOSIS — J449 Chronic obstructive pulmonary disease, unspecified: Secondary | ICD-10-CM | POA: Diagnosis not present

## 2020-06-15 DIAGNOSIS — I82462 Acute embolism and thrombosis of left calf muscular vein: Secondary | ICD-10-CM | POA: Diagnosis not present

## 2020-06-15 DIAGNOSIS — I10 Essential (primary) hypertension: Secondary | ICD-10-CM | POA: Diagnosis not present

## 2020-06-15 DIAGNOSIS — I2699 Other pulmonary embolism without acute cor pulmonale: Secondary | ICD-10-CM | POA: Diagnosis not present

## 2020-06-15 NOTE — Telephone Encounter (Signed)
Gracee w/ [facility] is requesting PT verbals for 1w1, 2w4, 1w4. Please advise    Phone: [phone]

## 2020-06-15 NOTE — Telephone Encounter (Signed)
Magda Paganini from [facility] has called in regards to the patient being admitted yesterday with them.   Requesting verbal orders for nursing every other week for 8 weeks with therapy as well  Magda Paganini- [phone]

## 2020-06-16 NOTE — Telephone Encounter (Signed)
Noticed Gracee w/MD response.Marland KitchenJohny Chess

## 2020-06-16 NOTE — Telephone Encounter (Signed)
Okay.  Thanks.

## 2020-06-16 NOTE — Telephone Encounter (Signed)
Notified Magda Paganini w/MD response.Marland KitchenJohny Chess

## 2020-06-19 ENCOUNTER — Telehealth: Payer: Self-pay | Admitting: Internal Medicine

## 2020-06-19 MED ORDER — TORSEMIDE 10 MG PO TABS
10.0000 mg | ORAL_TABLET | Freq: Every day | ORAL | 0 refills | Status: DC | PRN
Start: 2020-06-19 — End: 2020-07-05

## 2020-06-19 MED ORDER — PANTOPRAZOLE SODIUM 40 MG PO TBEC: 40.0000 mg | DELAYED_RELEASE_TABLET | Freq: Every evening | ORAL | 3 refills | Status: AC

## 2020-06-19 NOTE — Telephone Encounter (Signed)
Reviewed chart pt is up-to-date sent refills to [facility].Marland KitchenJohny Chess

## 2020-06-19 NOTE — Telephone Encounter (Signed)
1.Medication Requested: pantoprazole (PROTONIX) 40 MG tablet  torsemide (DEMADEX) 10 MG tablet    2. Pharmacy (Name, [place], [facility]): [facility] - [place], [place]  3. On Med List: yes   4. Last Visit with PCP: 1.27.22  5. Next visit date with PCP: 3.30.22   Agent: Please be advised that RX refills may take up to 3 business days. We ask that you follow-up with your pharmacy.

## 2020-06-20 DIAGNOSIS — J449 Chronic obstructive pulmonary disease, unspecified: Secondary | ICD-10-CM | POA: Diagnosis not present

## 2020-06-20 DIAGNOSIS — I2699 Other pulmonary embolism without acute cor pulmonale: Secondary | ICD-10-CM | POA: Diagnosis not present

## 2020-06-20 DIAGNOSIS — I10 Essential (primary) hypertension: Secondary | ICD-10-CM | POA: Diagnosis not present

## 2020-06-20 DIAGNOSIS — I82462 Acute embolism and thrombosis of left calf muscular vein: Secondary | ICD-10-CM | POA: Diagnosis not present

## 2020-06-20 DIAGNOSIS — I82461 Acute embolism and thrombosis of right calf muscular vein: Secondary | ICD-10-CM | POA: Diagnosis not present

## 2020-06-20 DIAGNOSIS — I2510 Atherosclerotic heart disease of native coronary artery without angina pectoris: Secondary | ICD-10-CM | POA: Diagnosis not present

## 2020-06-21 ENCOUNTER — Inpatient Hospital Stay: Payer: Medicare Other | Attending: Hematology & Oncology

## 2020-06-21 ENCOUNTER — Other Ambulatory Visit: Payer: Self-pay

## 2020-06-21 ENCOUNTER — Encounter: Payer: Self-pay | Admitting: Hematology & Oncology

## 2020-06-21 ENCOUNTER — Inpatient Hospital Stay: Payer: Medicare Other

## 2020-06-21 ENCOUNTER — Inpatient Hospital Stay (HOSPITAL_BASED_OUTPATIENT_CLINIC_OR_DEPARTMENT_OTHER): Payer: Medicare Other | Admitting: Hematology & Oncology

## 2020-06-21 VITALS — BP 143/72 | HR 64 | Temp 97.0°F | Resp 18 | Ht 66.0 in | Wt 151.0 lb

## 2020-06-21 DIAGNOSIS — D500 Iron deficiency anemia secondary to blood loss (chronic): Secondary | ICD-10-CM | POA: Diagnosis not present

## 2020-06-21 DIAGNOSIS — Z86711 Personal history of pulmonary embolism: Secondary | ICD-10-CM | POA: Diagnosis not present

## 2020-06-21 DIAGNOSIS — D509 Iron deficiency anemia, unspecified: Secondary | ICD-10-CM | POA: Diagnosis not present

## 2020-06-21 DIAGNOSIS — D649 Anemia, unspecified: Secondary | ICD-10-CM

## 2020-06-21 DIAGNOSIS — Z85820 Personal history of malignant melanoma of skin: Secondary | ICD-10-CM | POA: Insufficient documentation

## 2020-06-21 DIAGNOSIS — Z95828 Presence of other vascular implants and grafts: Secondary | ICD-10-CM

## 2020-06-21 DIAGNOSIS — Z7901 Long term (current) use of anticoagulants: Secondary | ICD-10-CM | POA: Insufficient documentation

## 2020-06-21 DIAGNOSIS — Z79899 Other long term (current) drug therapy: Secondary | ICD-10-CM | POA: Insufficient documentation

## 2020-06-21 DIAGNOSIS — I2602 Saddle embolus of pulmonary artery with acute cor pulmonale: Secondary | ICD-10-CM | POA: Diagnosis not present

## 2020-06-21 DIAGNOSIS — C4372 Malignant melanoma of left lower limb, including hip: Secondary | ICD-10-CM

## 2020-06-21 DIAGNOSIS — I2782 Chronic pulmonary embolism: Secondary | ICD-10-CM | POA: Diagnosis not present

## 2020-06-21 DIAGNOSIS — R42 Dizziness and giddiness: Secondary | ICD-10-CM

## 2020-06-21 LAB — CBC WITH DIFFERENTIAL (CANCER CENTER ONLY)
Abs Immature Granulocytes: 0.04 10*3/uL (ref 0.00–0.07)
Basophils Absolute: 0 10*3/uL (ref 0.0–0.1)
Basophils Relative: 0 %
Eosinophils Absolute: 0 10*3/uL (ref 0.0–0.5)
Eosinophils Relative: 0 %
HCT: 36.5 % — ABNORMAL LOW (ref 39.0–52.0)
Hemoglobin: 11.5 g/dL — ABNORMAL LOW (ref 13.0–17.0)
Immature Granulocytes: 1 %
Lymphocytes Relative: 6 %
Lymphs Abs: 0.6 10*3/uL — ABNORMAL LOW (ref 0.7–4.0)
MCH: 31 pg (ref 26.0–34.0)
MCHC: 31.5 g/dL (ref 30.0–36.0)
MCV: 98.4 fL (ref 80.0–100.0)
Monocytes Absolute: 0.3 10*3/uL (ref 0.1–1.0)
Monocytes Relative: 3 %
Neutro Abs: 7.7 10*3/uL (ref 1.7–7.7)
Neutrophils Relative %: 90 %
Platelet Count: 158 10*3/uL (ref 150–400)
RBC: 3.71 MIL/uL — ABNORMAL LOW (ref 4.22–5.81)
RDW: 13.8 % (ref 11.5–15.5)
WBC Count: 8.6 10*3/uL (ref 4.0–10.5)
nRBC: 0 % (ref 0.0–0.2)

## 2020-06-21 LAB — SAVE SMEAR(SSMR), FOR PROVIDER SLIDE REVIEW

## 2020-06-21 LAB — CMP (CANCER CENTER ONLY)
ALT: 7 U/L (ref 0–44)
AST: 11 U/L — ABNORMAL LOW (ref 15–41)
Albumin: 3.8 g/dL (ref 3.5–5.0)
Alkaline Phosphatase: 42 U/L (ref 38–126)
Anion gap: 6 (ref 5–15)
BUN: 19 mg/dL (ref 8–23)
CO2: 37 mmol/L — ABNORMAL HIGH (ref 22–32)
Calcium: 8.9 mg/dL (ref 8.9–10.3)
Chloride: 93 mmol/L — ABNORMAL LOW (ref 98–111)
Creatinine: 1.06 mg/dL (ref 0.61–1.24)
GFR, Estimated: >60 mL/min (ref >60)
Glucose, Bld: 134 mg/dL — ABNORMAL HIGH (ref 70–99)
Potassium: 3.7 mmol/L (ref 3.5–5.1)
Sodium: 136 mmol/L (ref 135–145)
Total Bilirubin: 0.9 mg/dL (ref 0.3–1.2)
Total Protein: 5.6 g/dL — ABNORMAL LOW (ref 6.5–8.1)

## 2020-06-21 LAB — SAMPLE TO BLOOD BANK

## 2020-06-21 LAB — LACTATE DEHYDROGENASE: LDH: 211 U/L — ABNORMAL HIGH (ref 98–192)

## 2020-06-21 MED ORDER — HEPARIN SOD (PORK) LOCK FLUSH 100 UNIT/ML IV SOLN
500.0000 [IU] | Freq: Once | INTRAVENOUS | Status: AC
  Administered 2020-06-21: 500 [IU] via INTRAVENOUS
  Filled 2020-06-21: qty 5

## 2020-06-21 MED ORDER — SODIUM CHLORIDE 0.9% FLUSH
10.0000 mL | INTRAVENOUS | Status: DC | PRN
  Administered 2020-06-21: 10 mL via INTRAVENOUS
  Filled 2020-06-21: qty 10

## 2020-06-21 NOTE — Patient Instructions (Signed)
Implanted Port Insertion, Care After This sheet gives you information about how to care for yourself after your procedure. Your health care provider may also give you more specific instructions. If you have problems or questions, contact your health care provider. What can I expect after the procedure? After the procedure, it is common to have:  Discomfort at the port insertion site.  Bruising on the skin over the port. This should improve over 3-4 days. Follow these instructions at home: Port care  After your port is placed, you will get a manufacturer's information card. The card has information about your port. Keep this card with you at all times.  Take care of the port as told by your health care provider. Ask your health care provider if you or a family member can get training for taking care of the port at home. A home health care nurse may also take care of the port.  Make sure to remember what type of port you have. Incision care  Follow instructions from your health care provider about how to take care of your port insertion site. Make sure you: ? Wash your hands with soap and water before and after you change your bandage (dressing). If soap and water are not available, use hand sanitizer. ? Change your dressing as told by your health care provider. ? Leave stitches (sutures), skin glue, or adhesive strips in place. These skin closures may need to stay in place for 2 weeks or longer. If adhesive strip edges start to loosen and curl up, you may trim the loose edges. Do not remove adhesive strips completely unless your health care provider tells you to do that.  Check your port insertion site every day for signs of infection. Check for: ? Redness, swelling, or pain. ? Fluid or blood. ? Warmth. ? Pus or a bad smell.      Activity  Return to your normal activities as told by your health care provider. Ask your health care provider what activities are safe for you.  Do not  lift anything that is heavier than 10 lb (4.5 kg), or the limit that you are told, until your health care provider says that it is safe. General instructions  Take over-the-counter and prescription medicines only as told by your health care provider.  Do not take baths, swim, or use a hot tub until your health care provider approves. Ask your health care provider if you may take showers. You may only be allowed to take sponge baths.  Do not drive for 24 hours if you were given a sedative during your procedure.  Wear a medical alert bracelet in case of an emergency. This will tell any health care providers that you have a port.  Keep all follow-up visits as told by your health care provider. This is important. Contact a health care provider if:  You cannot flush your port with saline as directed, or you cannot draw blood from the port.  You have a fever or chills.  You have redness, swelling, or pain around your port insertion site.  You have fluid or blood coming from your port insertion site.  Your port insertion site feels warm to the touch.  You have pus or a bad smell coming from the port insertion site. Get help right away if:  You have chest pain or shortness of breath.  You have bleeding from your port that you cannot control. Summary  Take care of the port as told by your   health care provider. Keep the manufacturer's information card with you at all times.  Change your dressing as told by your health care provider.  Contact a health care provider if you have a fever or chills or if you have redness, swelling, or pain around your port insertion site.  Keep all follow-up visits as told by your health care provider. This information is not intended to replace advice given to you by your health care provider. Make sure you discuss any questions you have with your health care provider. Document Revised: 10/21/2017 Document Reviewed: 10/21/2017 Elsevier Patient Education   2021 Elsevier Inc.  

## 2020-06-21 NOTE — Progress Notes (Signed)
Hematology and Oncology Follow Up Visit  [name] [id number] [birth year] 75 y.o. 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  It has been quite busy for him since he was last here.  He she was in for a transfusion.  His blood was quite low.  However he looked quite sick.  He is quite hypoxic.  We got into the emergency room and found that he had small pulmonary emboli.  He was admitted because of his hypoxia.  He was started on heparin and then converted over to Eliquis.  He had Dopplers of his legs done.  He had bilateral lower extremity DVT.  In the right leg he had a thrombus in the right gastrocnemius vein.  On the left, he had thrombus also in the left gastrocnemius vein.  He looks much better.  He feels better.  He is getting back into exercising.  He has had no bleeding.  There is been no melena or bright red blood per rectum.  He is eating better.  He is having no nausea or vomiting.  He still gets little bit of dizziness.  He is not sleeping says the Eliquis caused him to have sleep issues.  There is been no chest pain.  He has had no obvious leg swelling.  Currently, his performance status is ECOG 1.     Medications:  Allergies as of 06/21/2020      Reactions   Cefepime Hives, Shortness Of Breath   Cephaeline Hives   Morphine Swelling   A swollen stomach.   Morphine And Related Shortness Of Breath, Nausea And Vomiting, Swelling, Other (See  Comments)   Agitation, tolerates dilaudid Other reaction(s): Other (See Comments) Agitation, tolerates dilaudid   Penicillins Hives, Shortness Of Breath, Rash   Has patient had a PCN reaction causing immediate rash, facial/tongue/throat swelling, SOB or lightheadedness with hypotension: Yes Has patient had a PCN reaction causing severe rash involving mucus membranes or skin necrosis: Yes Has patient had a PCN reaction that required hospitalization No Has patient had a PCN reaction occurring within the last 10 years: No If all of the above answers are "NO", then may proceed with Cephalosporin use.   Doxycycline Rash   Oxycodone-acetaminophen Other (See Comments)   Patient doesn't remember what type of reaction.      Medication List       Accurate as of June 21, 2020  5:11 PM. If you have any questions, ask your nurse or doctor.        albuterol 108 (90 Base) MCG/ACT inhaler Commonly known as: VENTOLIN HFA Inhale 1-2 puffs into the lungs every 4 (four) hours as needed for wheezing or shortness of breath.   ALPRAZolam 0.5 MG tablet Commonly known as: XANAX Take 1 tablet (0.5 mg total) by mouth 3 (three) times daily as needed for anxiety. for anxiety   apixaban 5 MG Tabs tablet Commonly known as: ELIQUIS Take 1 tablet (5 mg total) by mouth 2 (two) times daily.   B-6 PO Take 1 tablet  by mouth.   FLUoxetine 40 MG capsule Commonly known as: PROZAC Take 1 capsule (40 mg total) by mouth daily.   fluticasone 50 MCG/ACT nasal spray Commonly known as: FLONASE INSTILL 2 SPRAYS IN EACH NOSTRIL EVERY DAY   gabapentin 300 MG capsule Commonly known as: NEURONTIN Take 2 capsules (600 mg total) by mouth at bedtime.   HYDROmorphone 2 MG tablet Commonly known as: DILAUDID Take 1-1.5 tablets (2-3 mg total) by mouth every 8 (eight) hours as needed for severe pain.   HYDROmorphone 2 MG tablet Commonly known as: DILAUDID Take 1 tablet (2 mg total) by mouth every 8 (eight) hours as  needed for severe pain.   lidocaine-prilocaine cream Commonly known as: EMLA Apply 1 application topically as needed.   melatonin 5 MG Tabs Take 5 mg by mouth at bedtime. Takes 10 mg at HS   metoprolol tartrate 25 MG tablet Commonly known as: LOPRESSOR TAKE 1 TABLET (25 MG TOTAL) BY MOUTH 2 (TWO) TIMES DAILY.   pantoprazole 40 MG tablet Commonly known as: PROTONIX Take 1 tablet (40 mg total) by mouth every evening.   predniSONE 10 MG tablet Commonly known as: DELTASONE 20 mg po qam pc or as directed What changed:   how much to take  how to take this  when to take this  additional instructions   torsemide 10 MG tablet Commonly known as: DEMADEX Take 1 tablet (10 mg total) by mouth daily as needed.   traZODone 100 MG tablet Commonly known as: DESYREL TAKE 1 TABLET AT BEDTIME   vitamin B-12 1000 MCG tablet Commonly known as: CYANOCOBALAMIN Take 1,000 mcg by mouth daily.   Vitamin D3 50 MCG (2000 UT) capsule Take 1 capsule (2,000 Units total) by mouth daily.       Allergies:  Allergies  Allergen Reactions  . Cefepime Hives and Shortness Of Breath  . Cephaeline Hives  . Morphine Swelling    A swollen stomach.  . Morphine And Related Shortness Of Breath, Nausea And Vomiting, Swelling and Other (See Comments)    Agitation, tolerates dilaudid Other reaction(s): Other (See Comments) Agitation, tolerates dilaudid  . Penicillins Hives, Shortness Of Breath and Rash    Has patient had a PCN reaction causing immediate rash, facial/tongue/throat swelling, SOB or lightheadedness with hypotension: Yes Has patient had a PCN reaction causing severe rash involving mucus membranes or skin necrosis: Yes Has patient had a PCN reaction that required hospitalization No Has patient had a PCN reaction occurring within the last 10 years: No If all of the above answers are "NO", then may proceed with Cephalosporin use.   [name] Doxycycline Rash  . Oxycodone-Acetaminophen Other (See  Comments)    Patient doesn't remember what type of reaction.    Past Medical History, Surgical history, Social history, and Family History were reviewed and updated.  Review of Systems: Review of Systems  Constitutional: Positive for malaise/fatigue.  HENT: Negative.   Eyes: Negative.   Respiratory: Positive for shortness of breath.   Cardiovascular: Positive for palpitations.  Gastrointestinal: Positive for nausea.  Genitourinary: Negative.   Musculoskeletal: Positive for joint pain and neck pain.  Skin: Negative.   Neurological: Positive for dizziness.  Endo/Heme/Allergies: Bruises/bleeds easily.  Psychiatric/Behavioral: Negative.       Physical Exam:  height is 5' 6"  (1.676 m) and weight is 151 lb (68.5 kg). His oral temperature is 97 F (36.1 C) (abnormal). His blood pressure is 143/72 (abnormal) and his pulse is 64. His respiration is 18 and oxygen  saturation is 91%.   Wt Readings from Last 3 Encounters:  06/21/20 151 lb (68.5 kg)  05/26/20 150 lb 2.1 oz (68.1 kg)  05/25/20 150 lb 0.6 oz (68.1 kg)    Physical Exam Vitals reviewed.  HENT:     Head: Normocephalic and atraumatic.  Eyes:     Pupils: Pupils are equal, round, and reactive to light.  Cardiovascular:     Rate and Rhythm: Normal rate and regular rhythm.     Heart sounds: Normal heart sounds.  Pulmonary:     Effort: Pulmonary effort is normal.     Breath sounds: Normal breath sounds.  Abdominal:     General: Bowel sounds are normal.     Palpations: Abdomen is soft.  Musculoskeletal:        General: No tenderness or deformity. Normal range of motion.     Cervical back: Normal range of motion.  Lymphadenopathy:     Cervical: No cervical adenopathy.  Skin:    General: Skin is warm and dry.     Findings: No erythema or rash.  Neurological:     Mental Status: He is alert and oriented to person, place, and time.  Psychiatric:        Behavior: Behavior normal.        Thought Content: Thought content  normal.        Judgment: Judgment normal.    Lab Results  Component Value Date   WBC 8.6 06/21/2020   HGB 11.5 (L) 06/21/2020   HCT 36.5 (L) 06/21/2020   MCV 98.4 06/21/2020   PLT 158 06/21/2020   Lab Results  Component Value Date   FERRITIN 210 05/25/2020   IRON 81 05/27/2020   TIBC 187 (L) 05/27/2020   UIBC 106 05/27/2020   IRONPCTSAT 43 (H) 05/27/2020   Lab Results  Component Value Date   RETICCTPCT 5.3 (H) 05/25/2020   RBC 3.71 (L) 06/21/2020   No results found for: KPAFRELGTCHN, LAMBDASER, KAPLAMBRATIO No results found for: Kandis Cocking, IGMSERUM No results found for: Odetta Pink, SPEI   Chemistry      Component Value Date/Time   NA 136 06/21/2020 1420   NA 140 04/10/2017 1046   NA 135 (L) 03/06/2017 1104   K 3.7 06/21/2020 1420   K 3.7 04/10/2017 1046   K 4.2 03/06/2017 1104   CL 93 (L) 06/21/2020 1420   CL 98 04/10/2017 1046   CO2 37 (H) 06/21/2020 1420   CO2 31 04/10/2017 1046   CO2 30 (H) 03/06/2017 1104   BUN 19 06/21/2020 1420   BUN 21 04/10/2017 1046   BUN 20.0 03/06/2017 1104   CREATININE 1.06 06/21/2020 1420   CREATININE 0.9 04/10/2017 1046   CREATININE 1.0 03/06/2017 1104      Component Value Date/Time   CALCIUM 8.9 06/21/2020 1420   CALCIUM 8.9 04/10/2017 1046   CALCIUM 9.4 03/06/2017 1104   ALKPHOS 42 06/21/2020 1420   ALKPHOS 65 04/10/2017 1046   ALKPHOS 78 03/06/2017 1104   AST 11 (L) 06/21/2020 1420   AST 20 03/06/2017 1104   ALT 7 06/21/2020 1420   ALT 21 04/10/2017 1046   ALT 20 03/06/2017 1104   BILITOT 0.9 06/21/2020 1420   BILITOT 0.47 03/06/2017 1104       Impression and Plan:   Mr. Gartin is a very pleasant 75yo caucasian gentleman with history of hemochromatosis,homozygous for the C282Y mutation which has not been a problem for himso far.  He did have melanoma.  This was treated in the adjuvant setting.  He had a very hard time with immunotherapy secondary to  diarrhea.  Now, his problem is the thromboembolic disease.  He was sick with a viral illness.  He was immobile.  As such, I really believe this was probably the source of the thromboembolism.  We will keep him on Eliquis.  I think he probably will need 2 years of Eliquis.  I would probably plan for a Doppler and CT angiogram to be done when we see him back in 6 weeks.  I am just glad that his blood counts are better that he feels better.  Last time he saw him, he really looked rough.  Thankfully he came to our office and were able to get him hospitalized because of the thromboembolism in his lungs.  It is always fun to talk to he and his wife.  They are incredibly delightful.  [name], MD 3/16/20225:11 PM

## 2020-06-22 ENCOUNTER — Telehealth: Payer: Self-pay

## 2020-06-22 LAB — IRON AND TIBC
Iron: 70 ug/dL (ref 42–163)
Saturation Ratios: 36 % (ref 20–55)
TIBC: 196 ug/dL — ABNORMAL LOW (ref 202–409)
UIBC: 127 ug/dL (ref 117–376)

## 2020-06-22 LAB — FERRITIN: Ferritin: 103 ng/mL (ref 24–336)

## 2020-06-22 NOTE — Telephone Encounter (Signed)
appts made per 06/21/20 los and pt is aware, pt also aware that he will rec. A call once his doppler and ct agio have been approved    [name]

## 2020-06-23 DIAGNOSIS — I2510 Atherosclerotic heart disease of native coronary artery without angina pectoris: Secondary | ICD-10-CM | POA: Diagnosis not present

## 2020-06-23 DIAGNOSIS — I82462 Acute embolism and thrombosis of left calf muscular vein: Secondary | ICD-10-CM | POA: Diagnosis not present

## 2020-06-23 DIAGNOSIS — I10 Essential (primary) hypertension: Secondary | ICD-10-CM | POA: Diagnosis not present

## 2020-06-23 DIAGNOSIS — J449 Chronic obstructive pulmonary disease, unspecified: Secondary | ICD-10-CM | POA: Diagnosis not present

## 2020-06-23 DIAGNOSIS — I2699 Other pulmonary embolism without acute cor pulmonale: Secondary | ICD-10-CM | POA: Diagnosis not present

## 2020-06-23 DIAGNOSIS — I82461 Acute embolism and thrombosis of right calf muscular vein: Secondary | ICD-10-CM | POA: Diagnosis not present

## 2020-06-26 DIAGNOSIS — Z20828 Contact with and (suspected) exposure to other viral communicable diseases: Secondary | ICD-10-CM | POA: Diagnosis not present

## 2020-06-26 DIAGNOSIS — Z1159 Encounter for screening for other viral diseases: Secondary | ICD-10-CM | POA: Diagnosis not present

## 2020-06-27 DIAGNOSIS — I82462 Acute embolism and thrombosis of left calf muscular vein: Secondary | ICD-10-CM | POA: Diagnosis not present

## 2020-06-27 DIAGNOSIS — J449 Chronic obstructive pulmonary disease, unspecified: Secondary | ICD-10-CM | POA: Diagnosis not present

## 2020-06-27 DIAGNOSIS — I10 Essential (primary) hypertension: Secondary | ICD-10-CM | POA: Diagnosis not present

## 2020-06-27 DIAGNOSIS — I2510 Atherosclerotic heart disease of native coronary artery without angina pectoris: Secondary | ICD-10-CM | POA: Diagnosis not present

## 2020-06-27 DIAGNOSIS — I82461 Acute embolism and thrombosis of right calf muscular vein: Secondary | ICD-10-CM | POA: Diagnosis not present

## 2020-06-27 DIAGNOSIS — I2699 Other pulmonary embolism without acute cor pulmonale: Secondary | ICD-10-CM | POA: Diagnosis not present

## 2020-06-28 DIAGNOSIS — I82462 Acute embolism and thrombosis of left calf muscular vein: Secondary | ICD-10-CM | POA: Diagnosis not present

## 2020-06-28 DIAGNOSIS — I2510 Atherosclerotic heart disease of native coronary artery without angina pectoris: Secondary | ICD-10-CM | POA: Diagnosis not present

## 2020-06-28 DIAGNOSIS — J449 Chronic obstructive pulmonary disease, unspecified: Secondary | ICD-10-CM | POA: Diagnosis not present

## 2020-06-28 DIAGNOSIS — I2699 Other pulmonary embolism without acute cor pulmonale: Secondary | ICD-10-CM | POA: Diagnosis not present

## 2020-06-28 DIAGNOSIS — I82461 Acute embolism and thrombosis of right calf muscular vein: Secondary | ICD-10-CM | POA: Diagnosis not present

## 2020-06-28 DIAGNOSIS — I10 Essential (primary) hypertension: Secondary | ICD-10-CM | POA: Diagnosis not present

## 2020-06-30 DIAGNOSIS — I10 Essential (primary) hypertension: Secondary | ICD-10-CM | POA: Diagnosis not present

## 2020-06-30 DIAGNOSIS — I2510 Atherosclerotic heart disease of native coronary artery without angina pectoris: Secondary | ICD-10-CM | POA: Diagnosis not present

## 2020-06-30 DIAGNOSIS — J449 Chronic obstructive pulmonary disease, unspecified: Secondary | ICD-10-CM | POA: Diagnosis not present

## 2020-06-30 DIAGNOSIS — I82461 Acute embolism and thrombosis of right calf muscular vein: Secondary | ICD-10-CM | POA: Diagnosis not present

## 2020-06-30 DIAGNOSIS — I2699 Other pulmonary embolism without acute cor pulmonale: Secondary | ICD-10-CM | POA: Diagnosis not present

## 2020-06-30 DIAGNOSIS — I82462 Acute embolism and thrombosis of left calf muscular vein: Secondary | ICD-10-CM | POA: Diagnosis not present

## 2020-07-03 DIAGNOSIS — I82461 Acute embolism and thrombosis of right calf muscular vein: Secondary | ICD-10-CM | POA: Diagnosis not present

## 2020-07-03 DIAGNOSIS — I10 Essential (primary) hypertension: Secondary | ICD-10-CM | POA: Diagnosis not present

## 2020-07-03 DIAGNOSIS — Z1159 Encounter for screening for other viral diseases: Secondary | ICD-10-CM | POA: Diagnosis not present

## 2020-07-03 DIAGNOSIS — J449 Chronic obstructive pulmonary disease, unspecified: Secondary | ICD-10-CM | POA: Diagnosis not present

## 2020-07-03 DIAGNOSIS — I82462 Acute embolism and thrombosis of left calf muscular vein: Secondary | ICD-10-CM | POA: Diagnosis not present

## 2020-07-03 DIAGNOSIS — I2510 Atherosclerotic heart disease of native coronary artery without angina pectoris: Secondary | ICD-10-CM | POA: Diagnosis not present

## 2020-07-03 DIAGNOSIS — Z20828 Contact with and (suspected) exposure to other viral communicable diseases: Secondary | ICD-10-CM | POA: Diagnosis not present

## 2020-07-03 DIAGNOSIS — I2699 Other pulmonary embolism without acute cor pulmonale: Secondary | ICD-10-CM | POA: Diagnosis not present

## 2020-07-05 ENCOUNTER — Ambulatory Visit (INDEPENDENT_AMBULATORY_CARE_PROVIDER_SITE_OTHER): Payer: Medicare Other | Admitting: Internal Medicine

## 2020-07-05 ENCOUNTER — Encounter: Payer: Self-pay | Admitting: Internal Medicine

## 2020-07-05 ENCOUNTER — Other Ambulatory Visit: Payer: Self-pay

## 2020-07-05 DIAGNOSIS — I700 Atherosclerosis of aorta: Secondary | ICD-10-CM | POA: Diagnosis not present

## 2020-07-05 DIAGNOSIS — I10 Essential (primary) hypertension: Secondary | ICD-10-CM | POA: Diagnosis not present

## 2020-07-05 DIAGNOSIS — M05711 Rheumatoid arthritis with rheumatoid factor of right shoulder without organ or systems involvement: Secondary | ICD-10-CM | POA: Diagnosis not present

## 2020-07-05 DIAGNOSIS — R609 Edema, unspecified: Secondary | ICD-10-CM

## 2020-07-05 DIAGNOSIS — I480 Paroxysmal atrial fibrillation: Secondary | ICD-10-CM

## 2020-07-05 DIAGNOSIS — I2699 Other pulmonary embolism without acute cor pulmonale: Secondary | ICD-10-CM

## 2020-07-05 MED ORDER — HYDROMORPHONE HCL 2 MG PO TABS: 2.0000 mg | ORAL_TABLET | Freq: Three times a day (TID) | ORAL | 0 refills | Status: DC | PRN

## 2020-07-05 MED ORDER — ALPRAZOLAM 0.5 MG PO TABS
0.5000 mg | ORAL_TABLET | Freq: Three times a day (TID) | ORAL | 1 refills | Status: DC | PRN
Start: 2020-07-05 — End: 2020-08-08

## 2020-07-05 MED ORDER — TORSEMIDE 100 MG PO TABS: 50.0000 mg | ORAL_TABLET | Freq: Every day | ORAL | 1 refills | Status: DC | PRN

## 2020-07-05 NOTE — Progress Notes (Signed)
Subjective:  Patient ID: [name], male    DOB: Oct 08, 1945  Age: 75 y.o. MRN: [id number]  CC: Follow-up (2 month f/u)   HPI FARRIS GEIMAN presents for chronic pain, RA, PE/DVT f/u C/o leg swelling   Per d/c summary 05/29/20:  Confusion/altered mental status -Per nursing staff and observation, mental status waxed and wanes -At times he is alert oriented x4 then becomes confused no agitation -It is thought to be multifactorial possibly transient hypoxia,no signs of infection -Monitor electrolytes -We will continue neurochecks   Acute respiratory failure with hypoxia: Resolved -Much improved has been weaned off supplemental oxygen, currently satting greater 92% on room air -Likely multifactorial, anemia, atelectasis, small subsegmental PE on CTA(lesion is too small to be affecting respiratory status) -Incentive spirometer, albuterol as needed -Patient is oncology has started him on Eliquis 05/27/2020  Symptomatic anemia: S/p 2 unit PRBC transfusion prior to transfer to inpatient bed. Hemoglobin improving.  Hemoglobin office and in ED negative -He has been on Plavix, he was started on Eliquis2/19/2022 -Monitoring H&H:9.7, 9.4, 9.2, 9.7 today  Acute right eye conjunctival hemorrhage -Hemorrhage has improved -Visual acuity intact ---- -Just started on Eliquis 10 mg twice daily>> was tapered down to 5 mg twice daily    Small subsegmental right upper lobe PE /bilateral DVT Seen on CTA as above.  -Discussed patient's oncologist. Dr. Marin Olp has started the patient on Eliquis -Due to symptomatic anemia, acute right hemorrhagic,multiple Bebe Shaggy is high risk for bleed Discussed with pharmacy we will reduce Eliquis from 10 to 5 mg p.o. twice daily   AKI: -Improved 2.32 >> 1.52 >> 0.93>>0.82 >> 0.87 Likely primarily due to GI losses from recent gastroenteritis. Status post gentle IV fluids  -Holding home HCTZ, losartan,  torsemide.  CAD s/p PCI: Chronic and stable, denies any chest pain. Plavix currently on hold.  COPD: -Continue albuterol as needed. -On room air, no signs of exacerbation, satting greater 92%  Hypertension -hypotensive Resume home Lopressor, -Discontinued hold,  HCTZ and losartan.  Hyperlipidemia: Continue statin.  Chronic hyponatremia: -Exacerbated by dehydration, sodium 126, 125, 134, 132  today -Responded to IV fluid hydration -Continue to hold home HCTZ and torsemide.  Anxiety: Continue home Xanax as needed with hold parameters.  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  Hypokalemia -Potassium 3.1, 3.2>>>monitoring and repleting  Debility/generalized weaknesses -Consulting PT OT for evaluation recommendation   Outpatient Medications Prior to Visit  Medication Sig Dispense Refill  . albuterol (PROVENTIL HFA;VENTOLIN HFA) 108 (90 Base) MCG/ACT inhaler Inhale 1-2 puffs into the lungs every 4 (four) hours as needed for wheezing or shortness of breath. 1 Inhaler 1  . ALPRAZolam (XANAX) 0.5 MG tablet Take 1 tablet (0.5 mg total) by mouth 3 (three) times daily as needed for anxiety. for anxiety 180 tablet 1  . apixaban (ELIQUIS) 5 MG TABS tablet Take 1 tablet (5 mg total) by mouth 2 (two) times daily. 180 tablet 2  . Cholecalciferol (VITAMIN D3) 2000 UNITS capsule Take 1 capsule (2,000 Units total) by mouth daily. 100 capsule 3  . FLUoxetine (PROZAC) 40 MG capsule Take 1 capsule (40 mg total) by mouth daily. 90 capsule 2  . fluticasone (FLONASE) 50 MCG/ACT nasal spray INSTILL 2 SPRAYS IN EACH NOSTRIL EVERY DAY 16 g 11  . gabapentin (NEURONTIN) 300 MG capsule Take 2 capsules (600 mg total) by mouth at bedtime. 180 capsule 2  . HYDROmorphone (DILAUDID) 2 MG tablet Take 1-1.5 tablets (2-3 mg total) by  mouth every 8 (eight) hours as needed for severe pain. 90 tablet 0  . HYDROmorphone (DILAUDID) 2 MG tablet Take 1  tablet (2 mg total) by mouth every 8 (eight) hours as needed for severe pain. 90 tablet 0  . lidocaine-prilocaine (EMLA) cream Apply 1 application topically as needed. 30 g 5  . Melatonin 5 MG TABS Take 5 mg by mouth at bedtime. Takes 10 mg at HS    . metoprolol tartrate (LOPRESSOR) 25 MG tablet TAKE 1 TABLET (25 MG TOTAL) BY MOUTH 2 (TWO) TIMES DAILY. 180 tablet 3  . pantoprazole (PROTONIX) 40 MG tablet Take 1 tablet (40 mg total) by mouth every evening. 90 tablet 3  . predniSONE (DELTASONE) 10 MG tablet 20 mg po qam pc or as directed (Patient taking differently: Take 10 mg by mouth in the morning. Taking for back) 60 tablet 1  . Pyridoxine HCl (B-6 PO) Take 1 tablet by mouth.     . torsemide (DEMADEX) 10 MG tablet Take 1 tablet (10 mg total) by mouth daily as needed. 90 tablet 0  . traZODone (DESYREL) 100 MG tablet TAKE 1 TABLET AT BEDTIME 90 tablet 3  . vitamin B-12 (CYANOCOBALAMIN) 1000 MCG tablet Take 1,000 mcg by mouth daily.      Facility-Administered Medications Prior to Visit  Medication Dose Route Frequency Provider Last Rate Last Admin  . furosemide (LASIX) injection 20 mg  20 mg Intravenous Once [place], Sarah M, NP      . heparin lock flush 100 unit/mL  250 Units Intracatheter PRN [place], Holli Humbles, NP      . sodium chloride flush (NS) 0.9 % injection 10 mL  10 mL Intracatheter PRN [place], Holli Humbles, NP        ROS: Review of Systems  Constitutional: Positive for fatigue. Negative for appetite change and unexpected weight change.  HENT: Negative for congestion, nosebleeds, sneezing, sore throat and trouble swallowing.   Eyes: Negative for itching and visual disturbance.  Respiratory: Positive for shortness of breath. Negative for cough.   Cardiovascular: Positive for leg swelling. Negative for chest pain and palpitations.  Gastrointestinal: Negative for abdominal distention, blood in stool, diarrhea and nausea.  Genitourinary: Negative for frequency and hematuria.   Musculoskeletal: Positive for arthralgias, back pain, gait problem, neck pain and neck stiffness. Negative for joint swelling.  Skin: Negative for rash.  Neurological: Positive for weakness. Negative for dizziness, tremors and speech difficulty.  Hematological: Bruises/bleeds easily.  Psychiatric/Behavioral: Negative for agitation, dysphoric mood and sleep disturbance. The patient is nervous/anxious.     Objective:  BP 140/86 (BP Location: Left Arm)   Pulse 63   Temp 97.7 F (36.5 C) (Oral)   Ht 5' 6"  (1.676 m)   Wt 154 lb 12.8 oz (70.2 kg)   SpO2 92%   BMI 24.99 kg/m   BP Readings from Last 3 Encounters:  07/05/20 140/86  06/21/20 (!) 143/72  05/29/20 135/84    Wt Readings from Last 3 Encounters:  07/05/20 154 lb 12.8 oz (70.2 kg)  06/21/20 151 lb (68.5 kg)  05/26/20 150 lb 2.1 oz (68.1 kg)    Physical Exam Constitutional:      General: He is not in acute distress.    Appearance: He is well-developed.     Comments: NAD  Eyes:     Conjunctiva/sclera: Conjunctivae normal.     Pupils: Pupils are equal, round, and reactive to light.  Neck:     Thyroid: No thyromegaly.     Vascular:  No JVD.  Cardiovascular:     Rate and Rhythm: Normal rate and regular rhythm.     Heart sounds: Normal heart sounds. No murmur heard. No friction rub. No gallop.   Pulmonary:     Effort: Pulmonary effort is normal. No respiratory distress.     Breath sounds: Normal breath sounds. No wheezing or rales.  Chest:     Chest wall: No tenderness.  Abdominal:     General: Bowel sounds are normal. There is no distension.     Palpations: Abdomen is soft. There is no mass.     Tenderness: There is no abdominal tenderness. There is no guarding or rebound.  Musculoskeletal:        General: Swelling, tenderness and deformity present. Normal range of motion.     Cervical back: Normal range of motion.     Right lower leg: Edema present.     Left lower leg: Edema present.  Lymphadenopathy:      Cervical: No cervical adenopathy.  Skin:    General: Skin is warm and dry.     Findings: No rash.  Neurological:     Mental Status: He is alert and oriented to person, place, and time.     Cranial Nerves: No cranial nerve deficit.     Motor: No abnormal muscle tone.     Coordination: Coordination abnormal.     Gait: Gait abnormal.     Deep Tendon Reflexes: Reflexes are normal and symmetric.  Psychiatric:        Behavior: Behavior normal.        Thought Content: Thought content normal.        Judgment: Judgment normal.    B 2+ edema  Lab Results  Component Value Date   WBC 8.6 06/21/2020   HGB 11.5 (L) 06/21/2020   HCT 36.5 (L) 06/21/2020   PLT 158 06/21/2020   GLUCOSE 134 (H) 06/21/2020   CHOL 128 03/10/2015   TRIG 95.0 03/10/2015   HDL 39.30 03/10/2015   LDLCALC 70 03/10/2015   ALT 7 06/21/2020   AST 11 (L) 06/21/2020   NA 136 06/21/2020   K 3.7 06/21/2020   CL 93 (L) 06/21/2020   CREATININE 1.06 06/21/2020   BUN 19 06/21/2020   CO2 37 (H) 06/21/2020   TSH 0.680 09/18/2017   PSA 1.95 10/15/2010   INR 1.1 (H) 06/14/2014   HGBA1C 6.0 09/28/2015    CT Angio Chest PE W and/or Wo Contrast  Result Date: 05/26/2020 CLINICAL DATA:  Low hemoglobin confusion EXAM: CT ANGIOGRAPHY CHEST WITH CONTRAST TECHNIQUE: Multidetector CT imaging of the chest was performed using the standard protocol during bolus administration of intravenous contrast. Multiplanar CT image reconstructions and MIPs were obtained to evaluate the vascular anatomy. CONTRAST:  146m OMNIPAQUE IOHEXOL 350 MG/ML SOLN COMPARISON:  Chest x-ray 05/26/2020, CT 10/14/2017 FINDINGS: Cardiovascular: Satisfactory opacification of the pulmonary arteries to the segmental level. Small nonocclusive filling defect within subsegmental right upper lobe pulmonary vessel, series 8, image number 80. No other definitive filling defects are visualized. Nonaneurysmal aorta. Moderate aortic atherosclerosis. Coronary vascular  calcification. Normal cardiac size. No pericardial effusion. Left-sided central venous port with tip in the SVC. Mediastinum/Nodes: Midline trachea. No thyroid mass. No suspicious nodes. Esophagus within normal limits. Lungs/Pleura: No pleural effusion or pneumothorax. Chronic elevation right diaphragm. Partial consolidation right lower lobe slightly increased. Upper Abdomen: No acute abnormality. Incompletely visualized right hepatic cysts. Musculoskeletal: Degenerative changes. Anterolisthesis C7 on T1. Partially visualized lower spinal hardware. No acute osseous  abnormality Review of the MIP images confirms the above findings. IMPRESSION: 1. Small nonocclusive filling defect within a subsegmental right upper lobe pulmonary vessel, consistent with small pulmonary embolus. 2. Chronic elevation right diaphragm with partial right lower lobe consolidation, some of which appears chronic. Acute process such as pneumonia superimposed on chronic atelectasis could be considered. 3. Aortic atherosclerosis. Aortic Atherosclerosis (ICD10-I70.0). Critical Value/emergent results were called by telephone at the time of interpretation on 05/26/2020 at 6:08 pm to provider [facility] , who verbally acknowledged these results. Electronically Signed   By: Donavan Foil M.D.   On: 05/26/2020 18:09   DG Chest Portable 1 View  Result Date: 05/26/2020 CLINICAL DATA:  Shortness of breath. EXAM: PORTABLE CHEST 1 VIEW COMPARISON:  PA and lateral chest 07/02/2017. FINDINGS: Lung volumes are low and there is chronic elevation of the right hemidiaphragm. No consolidative process, pneumothorax or effusion. Heart size is upper normal. Bilateral shoulder replacements and spinal fusion hardware again seen. Port-A-Cath remains in place. IMPRESSION: No acute disease. Electronically Signed   By: Inge Rise M.D.   On: 05/26/2020 14:52   VAS [place] LOWER EXTREMITY VENOUS (DVT)  Result Date: 05/29/2020  Lower Venous DVT Study Indications:  Pulmonary embolism.  Risk Factors: Confirmed PE. Limitations: Poor ultrasound/tissue interface and patient positioning. Comparison Study: No prior studies. Performing Technologist: Oliver Hum RVT  Examination Guidelines: A complete evaluation includes B-mode imaging, spectral Doppler, color Doppler, and power Doppler as needed of all accessible portions of each vessel. Bilateral testing is considered an integral part of a complete examination. Limited examinations for reoccurring indications may be performed as noted. The reflux portion of the exam is performed with the patient in reverse Trendelenburg.  +---------+---------------+---------+-----------+----------+-------------------+ RIGHT    CompressibilityPhasicitySpontaneityPropertiesThrombus Aging      +---------+---------------+---------+-----------+----------+-------------------+ CFV      Full           Yes      Yes                                      +---------+---------------+---------+-----------+----------+-------------------+ SFJ      Full                                                             +---------+---------------+---------+-----------+----------+-------------------+ FV Prox  Full                                                             +---------+---------------+---------+-----------+----------+-------------------+ FV Mid   Full                                                             +---------+---------------+---------+-----------+----------+-------------------+ FV DistalFull                                                             +---------+---------------+---------+-----------+----------+-------------------+  PFV      Full                                                             +---------+---------------+---------+-----------+----------+-------------------+ POP      Full           Yes      Yes                                       +---------+---------------+---------+-----------+----------+-------------------+ PTV      Full                                                             +---------+---------------+---------+-----------+----------+-------------------+ PERO                                                  Not well visualized +---------+---------------+---------+-----------+----------+-------------------+ Gastroc  Partial                                      Acute               +---------+---------------+---------+-----------+----------+-------------------+   +---------+---------------+---------+-----------+----------+--------------+ LEFT     CompressibilityPhasicitySpontaneityPropertiesThrombus Aging +---------+---------------+---------+-----------+----------+--------------+ CFV      Full           Yes      Yes                                 +---------+---------------+---------+-----------+----------+--------------+ SFJ      Full                                                        +---------+---------------+---------+-----------+----------+--------------+ FV Prox  Full                                                        +---------+---------------+---------+-----------+----------+--------------+ FV Mid   Full                                                        +---------+---------------+---------+-----------+----------+--------------+ FV DistalFull                                                        +---------+---------------+---------+-----------+----------+--------------+  PFV      Full                                                        +---------+---------------+---------+-----------+----------+--------------+ POP      Full           Yes      Yes                                 +---------+---------------+---------+-----------+----------+--------------+ PTV      Full                                                         +---------+---------------+---------+-----------+----------+--------------+ PERO     Full                                                        +---------+---------------+---------+-----------+----------+--------------+ Gastroc  Partial                                      Acute          +---------+---------------+---------+-----------+----------+--------------+     Summary: RIGHT: - Findings consistent with acute deep vein thrombosis involving the right gastrocnemius veins. - No cystic structure found in the popliteal fossa.  LEFT: - Findings consistent with acute deep vein thrombosis involving the left gastrocnemius veins. - No cystic structure found in the popliteal fossa. - There is an area of mixed echogenicity located in the groin measuring 1.4cm high by 2.4cm wide by 1.6cm long.  *See table(s) above for measurements and observations. Electronically signed by Ruta Hinds MD on 05/29/2020 at 6:00:41 PM.    Final     Assessment & Plan:   There are no diagnoses linked to this encounter.   No orders of the defined types were placed in this encounter.    Follow-up: No follow-ups on file.  [name], MD

## 2020-07-06 DIAGNOSIS — I10 Essential (primary) hypertension: Secondary | ICD-10-CM | POA: Diagnosis not present

## 2020-07-06 DIAGNOSIS — I82462 Acute embolism and thrombosis of left calf muscular vein: Secondary | ICD-10-CM | POA: Diagnosis not present

## 2020-07-06 DIAGNOSIS — I82461 Acute embolism and thrombosis of right calf muscular vein: Secondary | ICD-10-CM | POA: Diagnosis not present

## 2020-07-06 DIAGNOSIS — I2699 Other pulmonary embolism without acute cor pulmonale: Secondary | ICD-10-CM | POA: Diagnosis not present

## 2020-07-06 DIAGNOSIS — J449 Chronic obstructive pulmonary disease, unspecified: Secondary | ICD-10-CM | POA: Diagnosis not present

## 2020-07-06 DIAGNOSIS — I2510 Atherosclerotic heart disease of native coronary artery without angina pectoris: Secondary | ICD-10-CM | POA: Diagnosis not present

## 2020-07-07 ENCOUNTER — Other Ambulatory Visit: Payer: Self-pay | Admitting: *Deleted

## 2020-07-07 DIAGNOSIS — I82462 Acute embolism and thrombosis of left calf muscular vein: Secondary | ICD-10-CM | POA: Diagnosis not present

## 2020-07-07 DIAGNOSIS — J449 Chronic obstructive pulmonary disease, unspecified: Secondary | ICD-10-CM | POA: Diagnosis not present

## 2020-07-07 DIAGNOSIS — I2699 Other pulmonary embolism without acute cor pulmonale: Secondary | ICD-10-CM | POA: Diagnosis not present

## 2020-07-07 DIAGNOSIS — I82461 Acute embolism and thrombosis of right calf muscular vein: Secondary | ICD-10-CM | POA: Diagnosis not present

## 2020-07-07 DIAGNOSIS — I10 Essential (primary) hypertension: Secondary | ICD-10-CM | POA: Diagnosis not present

## 2020-07-07 DIAGNOSIS — I2510 Atherosclerotic heart disease of native coronary artery without angina pectoris: Secondary | ICD-10-CM | POA: Diagnosis not present

## 2020-07-07 MED ORDER — FLUOXETINE HCL 40 MG PO CAPS: 40.0000 mg | ORAL_CAPSULE | Freq: Every day | ORAL | 2 refills | Status: DC

## 2020-07-09 ENCOUNTER — Encounter: Payer: Self-pay | Admitting: Internal Medicine

## 2020-07-09 NOTE — Assessment & Plan Note (Signed)
Continue losartan, Lopressor

## 2020-07-09 NOTE — Assessment & Plan Note (Signed)
On Eliquis

## 2020-07-09 NOTE — Assessment & Plan Note (Signed)
On Eliquis for DVT.  Elevate legs.  Increase torsemide to 50-100 mg daily

## 2020-07-09 NOTE — Assessment & Plan Note (Signed)
And chronic pain continue with as needed Dilaudid

## 2020-07-11 ENCOUNTER — Other Ambulatory Visit: Payer: Self-pay | Admitting: *Deleted

## 2020-07-11 ENCOUNTER — Telehealth: Payer: Self-pay | Admitting: *Deleted

## 2020-07-11 DIAGNOSIS — D2262 Melanocytic nevi of left upper limb, including shoulder: Secondary | ICD-10-CM | POA: Diagnosis not present

## 2020-07-11 DIAGNOSIS — Z85820 Personal history of malignant melanoma of skin: Secondary | ICD-10-CM | POA: Diagnosis not present

## 2020-07-11 DIAGNOSIS — I2510 Atherosclerotic heart disease of native coronary artery without angina pectoris: Secondary | ICD-10-CM | POA: Diagnosis not present

## 2020-07-11 DIAGNOSIS — D692 Other nonthrombocytopenic purpura: Secondary | ICD-10-CM | POA: Diagnosis not present

## 2020-07-11 DIAGNOSIS — L570 Actinic keratosis: Secondary | ICD-10-CM | POA: Diagnosis not present

## 2020-07-11 DIAGNOSIS — L814 Other melanin hyperpigmentation: Secondary | ICD-10-CM | POA: Diagnosis not present

## 2020-07-11 DIAGNOSIS — D225 Melanocytic nevi of trunk: Secondary | ICD-10-CM | POA: Diagnosis not present

## 2020-07-11 DIAGNOSIS — D1801 Hemangioma of skin and subcutaneous tissue: Secondary | ICD-10-CM | POA: Diagnosis not present

## 2020-07-11 DIAGNOSIS — D2261 Melanocytic nevi of right upper limb, including shoulder: Secondary | ICD-10-CM | POA: Diagnosis not present

## 2020-07-11 DIAGNOSIS — L738 Other specified follicular disorders: Secondary | ICD-10-CM | POA: Diagnosis not present

## 2020-07-11 DIAGNOSIS — I2699 Other pulmonary embolism without acute cor pulmonale: Secondary | ICD-10-CM | POA: Diagnosis not present

## 2020-07-11 DIAGNOSIS — J449 Chronic obstructive pulmonary disease, unspecified: Secondary | ICD-10-CM | POA: Diagnosis not present

## 2020-07-11 DIAGNOSIS — Z85828 Personal history of other malignant neoplasm of skin: Secondary | ICD-10-CM | POA: Diagnosis not present

## 2020-07-11 DIAGNOSIS — D485 Neoplasm of uncertain behavior of skin: Secondary | ICD-10-CM | POA: Diagnosis not present

## 2020-07-11 DIAGNOSIS — I10 Essential (primary) hypertension: Secondary | ICD-10-CM | POA: Diagnosis not present

## 2020-07-11 DIAGNOSIS — C4372 Malignant melanoma of left lower limb, including hip: Secondary | ICD-10-CM

## 2020-07-11 DIAGNOSIS — L821 Other seborrheic keratosis: Secondary | ICD-10-CM | POA: Diagnosis not present

## 2020-07-11 DIAGNOSIS — I82462 Acute embolism and thrombosis of left calf muscular vein: Secondary | ICD-10-CM | POA: Diagnosis not present

## 2020-07-11 DIAGNOSIS — I82461 Acute embolism and thrombosis of right calf muscular vein: Secondary | ICD-10-CM | POA: Diagnosis not present

## 2020-07-11 DIAGNOSIS — R599 Enlarged lymph nodes, unspecified: Secondary | ICD-10-CM

## 2020-07-11 NOTE — Telephone Encounter (Signed)
Received a call from patient stating that he found an inch size nodule on his left groin area on the sam side as his melanoma was.  He found it this weekend and just happened to have a dermatology appt today.  Saw Dr. Audley Hose but she did not biopsy it but biopsied something on his back.  Dr Marin Olp notified.  Ordered CT Pelvis ordered and to be done. Patient notified.

## 2020-07-12 DIAGNOSIS — I2510 Atherosclerotic heart disease of native coronary artery without angina pectoris: Secondary | ICD-10-CM | POA: Diagnosis not present

## 2020-07-12 DIAGNOSIS — I82461 Acute embolism and thrombosis of right calf muscular vein: Secondary | ICD-10-CM | POA: Diagnosis not present

## 2020-07-12 DIAGNOSIS — I82462 Acute embolism and thrombosis of left calf muscular vein: Secondary | ICD-10-CM | POA: Diagnosis not present

## 2020-07-12 DIAGNOSIS — I2699 Other pulmonary embolism without acute cor pulmonale: Secondary | ICD-10-CM | POA: Diagnosis not present

## 2020-07-12 DIAGNOSIS — I10 Essential (primary) hypertension: Secondary | ICD-10-CM | POA: Diagnosis not present

## 2020-07-12 DIAGNOSIS — J449 Chronic obstructive pulmonary disease, unspecified: Secondary | ICD-10-CM | POA: Diagnosis not present

## 2020-07-14 DIAGNOSIS — I2510 Atherosclerotic heart disease of native coronary artery without angina pectoris: Secondary | ICD-10-CM | POA: Diagnosis not present

## 2020-07-14 DIAGNOSIS — F419 Anxiety disorder, unspecified: Secondary | ICD-10-CM | POA: Diagnosis not present

## 2020-07-14 DIAGNOSIS — I82461 Acute embolism and thrombosis of right calf muscular vein: Secondary | ICD-10-CM | POA: Diagnosis not present

## 2020-07-14 DIAGNOSIS — Z7901 Long term (current) use of anticoagulants: Secondary | ICD-10-CM | POA: Diagnosis not present

## 2020-07-14 DIAGNOSIS — I10 Essential (primary) hypertension: Secondary | ICD-10-CM | POA: Diagnosis not present

## 2020-07-14 DIAGNOSIS — Z85820 Personal history of malignant melanoma of skin: Secondary | ICD-10-CM | POA: Diagnosis not present

## 2020-07-14 DIAGNOSIS — M549 Dorsalgia, unspecified: Secondary | ICD-10-CM | POA: Diagnosis not present

## 2020-07-14 DIAGNOSIS — I82462 Acute embolism and thrombosis of left calf muscular vein: Secondary | ICD-10-CM | POA: Diagnosis not present

## 2020-07-14 DIAGNOSIS — E871 Hypo-osmolality and hyponatremia: Secondary | ICD-10-CM | POA: Diagnosis not present

## 2020-07-14 DIAGNOSIS — J449 Chronic obstructive pulmonary disease, unspecified: Secondary | ICD-10-CM | POA: Diagnosis not present

## 2020-07-14 DIAGNOSIS — H1131 Conjunctival hemorrhage, right eye: Secondary | ICD-10-CM | POA: Diagnosis not present

## 2020-07-14 DIAGNOSIS — E876 Hypokalemia: Secondary | ICD-10-CM | POA: Diagnosis not present

## 2020-07-14 DIAGNOSIS — E785 Hyperlipidemia, unspecified: Secondary | ICD-10-CM | POA: Diagnosis not present

## 2020-07-14 DIAGNOSIS — D509 Iron deficiency anemia, unspecified: Secondary | ICD-10-CM | POA: Diagnosis not present

## 2020-07-14 DIAGNOSIS — G894 Chronic pain syndrome: Secondary | ICD-10-CM | POA: Diagnosis not present

## 2020-07-14 DIAGNOSIS — I2699 Other pulmonary embolism without acute cor pulmonale: Secondary | ICD-10-CM | POA: Diagnosis not present

## 2020-07-14 DIAGNOSIS — Z7952 Long term (current) use of systemic steroids: Secondary | ICD-10-CM | POA: Diagnosis not present

## 2020-07-14 DIAGNOSIS — M069 Rheumatoid arthritis, unspecified: Secondary | ICD-10-CM | POA: Diagnosis not present

## 2020-07-17 DIAGNOSIS — Z1159 Encounter for screening for other viral diseases: Secondary | ICD-10-CM | POA: Diagnosis not present

## 2020-07-17 DIAGNOSIS — Z20828 Contact with and (suspected) exposure to other viral communicable diseases: Secondary | ICD-10-CM | POA: Diagnosis not present

## 2020-07-19 DIAGNOSIS — I2699 Other pulmonary embolism without acute cor pulmonale: Secondary | ICD-10-CM | POA: Diagnosis not present

## 2020-07-19 DIAGNOSIS — I82462 Acute embolism and thrombosis of left calf muscular vein: Secondary | ICD-10-CM | POA: Diagnosis not present

## 2020-07-19 DIAGNOSIS — I82461 Acute embolism and thrombosis of right calf muscular vein: Secondary | ICD-10-CM | POA: Diagnosis not present

## 2020-07-19 DIAGNOSIS — I2510 Atherosclerotic heart disease of native coronary artery without angina pectoris: Secondary | ICD-10-CM | POA: Diagnosis not present

## 2020-07-19 DIAGNOSIS — J449 Chronic obstructive pulmonary disease, unspecified: Secondary | ICD-10-CM | POA: Diagnosis not present

## 2020-07-19 DIAGNOSIS — I10 Essential (primary) hypertension: Secondary | ICD-10-CM | POA: Diagnosis not present

## 2020-07-20 ENCOUNTER — Encounter (HOSPITAL_COMMUNITY): Payer: Self-pay

## 2020-07-20 ENCOUNTER — Other Ambulatory Visit: Payer: Self-pay

## 2020-07-20 ENCOUNTER — Ambulatory Visit (HOSPITAL_COMMUNITY)
Admission: RE | Admit: 2020-07-20 | Discharge: 2020-07-20 | Disposition: A | Discharge status: Home / Self Care | Payer: Medicare Other | Source: Ambulatory Visit | Attending: Hematology & Oncology | Admitting: Hematology & Oncology

## 2020-07-20 DIAGNOSIS — R1909 Other intra-abdominal and pelvic swelling, mass and lump: Secondary | ICD-10-CM | POA: Diagnosis not present

## 2020-07-20 DIAGNOSIS — Z85820 Personal history of malignant melanoma of skin: Secondary | ICD-10-CM | POA: Diagnosis not present

## 2020-07-20 DIAGNOSIS — R599 Enlarged lymph nodes, unspecified: Secondary | ICD-10-CM | POA: Insufficient documentation

## 2020-07-20 DIAGNOSIS — N281 Cyst of kidney, acquired: Secondary | ICD-10-CM | POA: Diagnosis not present

## 2020-07-20 DIAGNOSIS — L723 Sebaceous cyst: Secondary | ICD-10-CM | POA: Diagnosis not present

## 2020-07-20 MED ORDER — HEPARIN SOD (PORK) LOCK FLUSH 100 UNIT/ML IV SOLN
500.0000 [IU] | Freq: Once | INTRAVENOUS | Status: AC
  Administered 2020-07-20: 500 [IU] via INTRAVENOUS

## 2020-07-20 MED ORDER — IOHEXOL 300 MG/ML  SOLN
100.0000 mL | Freq: Once | INTRAMUSCULAR | Status: AC | PRN
  Administered 2020-07-20: 100 mL via INTRAVENOUS

## 2020-07-21 ENCOUNTER — Other Ambulatory Visit: Payer: Self-pay | Admitting: Hematology & Oncology

## 2020-07-21 DIAGNOSIS — I82461 Acute embolism and thrombosis of right calf muscular vein: Secondary | ICD-10-CM | POA: Diagnosis not present

## 2020-07-21 DIAGNOSIS — I2510 Atherosclerotic heart disease of native coronary artery without angina pectoris: Secondary | ICD-10-CM | POA: Diagnosis not present

## 2020-07-21 DIAGNOSIS — I82462 Acute embolism and thrombosis of left calf muscular vein: Secondary | ICD-10-CM | POA: Diagnosis not present

## 2020-07-21 DIAGNOSIS — I10 Essential (primary) hypertension: Secondary | ICD-10-CM | POA: Diagnosis not present

## 2020-07-21 DIAGNOSIS — I2699 Other pulmonary embolism without acute cor pulmonale: Secondary | ICD-10-CM | POA: Diagnosis not present

## 2020-07-21 DIAGNOSIS — C4372 Malignant melanoma of left lower limb, including hip: Secondary | ICD-10-CM

## 2020-07-21 DIAGNOSIS — J449 Chronic obstructive pulmonary disease, unspecified: Secondary | ICD-10-CM | POA: Diagnosis not present

## 2020-07-24 DIAGNOSIS — Z1159 Encounter for screening for other viral diseases: Secondary | ICD-10-CM | POA: Diagnosis not present

## 2020-07-24 DIAGNOSIS — Z20828 Contact with and (suspected) exposure to other viral communicable diseases: Secondary | ICD-10-CM | POA: Diagnosis not present

## 2020-07-24 DIAGNOSIS — I700 Atherosclerosis of aorta: Secondary | ICD-10-CM | POA: Insufficient documentation

## 2020-07-24 NOTE — Assessment & Plan Note (Signed)
  On diet  

## 2020-07-25 DIAGNOSIS — I2699 Other pulmonary embolism without acute cor pulmonale: Secondary | ICD-10-CM | POA: Diagnosis not present

## 2020-07-25 DIAGNOSIS — J449 Chronic obstructive pulmonary disease, unspecified: Secondary | ICD-10-CM | POA: Diagnosis not present

## 2020-07-25 DIAGNOSIS — I82462 Acute embolism and thrombosis of left calf muscular vein: Secondary | ICD-10-CM | POA: Diagnosis not present

## 2020-07-25 DIAGNOSIS — I82461 Acute embolism and thrombosis of right calf muscular vein: Secondary | ICD-10-CM | POA: Diagnosis not present

## 2020-07-25 DIAGNOSIS — I2510 Atherosclerotic heart disease of native coronary artery without angina pectoris: Secondary | ICD-10-CM | POA: Diagnosis not present

## 2020-07-25 DIAGNOSIS — I10 Essential (primary) hypertension: Secondary | ICD-10-CM | POA: Diagnosis not present

## 2020-07-26 DIAGNOSIS — I82461 Acute embolism and thrombosis of right calf muscular vein: Secondary | ICD-10-CM | POA: Diagnosis not present

## 2020-07-26 DIAGNOSIS — I10 Essential (primary) hypertension: Secondary | ICD-10-CM | POA: Diagnosis not present

## 2020-07-26 DIAGNOSIS — I2510 Atherosclerotic heart disease of native coronary artery without angina pectoris: Secondary | ICD-10-CM | POA: Diagnosis not present

## 2020-07-26 DIAGNOSIS — J449 Chronic obstructive pulmonary disease, unspecified: Secondary | ICD-10-CM | POA: Diagnosis not present

## 2020-07-26 DIAGNOSIS — I2699 Other pulmonary embolism without acute cor pulmonale: Secondary | ICD-10-CM | POA: Diagnosis not present

## 2020-07-26 DIAGNOSIS — I82462 Acute embolism and thrombosis of left calf muscular vein: Secondary | ICD-10-CM | POA: Diagnosis not present

## 2020-07-28 ENCOUNTER — Other Ambulatory Visit: Payer: Self-pay | Admitting: Family

## 2020-07-28 DIAGNOSIS — I2510 Atherosclerotic heart disease of native coronary artery without angina pectoris: Secondary | ICD-10-CM | POA: Diagnosis not present

## 2020-07-28 DIAGNOSIS — I10 Essential (primary) hypertension: Secondary | ICD-10-CM | POA: Diagnosis not present

## 2020-07-28 DIAGNOSIS — R599 Enlarged lymph nodes, unspecified: Secondary | ICD-10-CM

## 2020-07-28 DIAGNOSIS — I82461 Acute embolism and thrombosis of right calf muscular vein: Secondary | ICD-10-CM | POA: Diagnosis not present

## 2020-07-28 DIAGNOSIS — I2699 Other pulmonary embolism without acute cor pulmonale: Secondary | ICD-10-CM | POA: Diagnosis not present

## 2020-07-28 DIAGNOSIS — J449 Chronic obstructive pulmonary disease, unspecified: Secondary | ICD-10-CM | POA: Diagnosis not present

## 2020-07-28 DIAGNOSIS — C4372 Malignant melanoma of left lower limb, including hip: Secondary | ICD-10-CM

## 2020-07-28 DIAGNOSIS — I82462 Acute embolism and thrombosis of left calf muscular vein: Secondary | ICD-10-CM | POA: Diagnosis not present

## 2020-07-31 ENCOUNTER — Ambulatory Visit (HOSPITAL_COMMUNITY)
Admission: RE | Admit: 2020-07-31 | Discharge: 2020-07-31 | Disposition: A | Discharge status: Home / Self Care | Payer: Medicare Other | Source: Ambulatory Visit | Attending: Hematology & Oncology | Admitting: Hematology & Oncology

## 2020-07-31 ENCOUNTER — Other Ambulatory Visit: Payer: Self-pay

## 2020-07-31 DIAGNOSIS — R590 Localized enlarged lymph nodes: Secondary | ICD-10-CM | POA: Diagnosis not present

## 2020-07-31 DIAGNOSIS — I2510 Atherosclerotic heart disease of native coronary artery without angina pectoris: Secondary | ICD-10-CM | POA: Diagnosis not present

## 2020-07-31 DIAGNOSIS — I517 Cardiomegaly: Secondary | ICD-10-CM | POA: Diagnosis not present

## 2020-07-31 DIAGNOSIS — C4372 Malignant melanoma of left lower limb, including hip: Secondary | ICD-10-CM | POA: Diagnosis not present

## 2020-07-31 DIAGNOSIS — I700 Atherosclerosis of aorta: Secondary | ICD-10-CM | POA: Diagnosis not present

## 2020-07-31 DIAGNOSIS — R599 Enlarged lymph nodes, unspecified: Secondary | ICD-10-CM | POA: Insufficient documentation

## 2020-07-31 DIAGNOSIS — K5730 Diverticulosis of large intestine without perforation or abscess without bleeding: Secondary | ICD-10-CM | POA: Insufficient documentation

## 2020-07-31 DIAGNOSIS — R911 Solitary pulmonary nodule: Secondary | ICD-10-CM | POA: Diagnosis not present

## 2020-07-31 LAB — GLUCOSE, CAPILLARY: Glucose-Capillary: 94 mg/dL (ref 70–99)

## 2020-07-31 MED ORDER — FLUDEOXYGLUCOSE F - 18 (FDG) INJECTION
8.0000 | Freq: Once | INTRAVENOUS | Status: AC | PRN
  Administered 2020-07-31: 7.4 via INTRAVENOUS

## 2020-08-02 DIAGNOSIS — I10 Essential (primary) hypertension: Secondary | ICD-10-CM | POA: Diagnosis not present

## 2020-08-02 DIAGNOSIS — J449 Chronic obstructive pulmonary disease, unspecified: Secondary | ICD-10-CM | POA: Diagnosis not present

## 2020-08-02 DIAGNOSIS — I2699 Other pulmonary embolism without acute cor pulmonale: Secondary | ICD-10-CM | POA: Diagnosis not present

## 2020-08-02 DIAGNOSIS — I2510 Atherosclerotic heart disease of native coronary artery without angina pectoris: Secondary | ICD-10-CM | POA: Diagnosis not present

## 2020-08-02 DIAGNOSIS — I82461 Acute embolism and thrombosis of right calf muscular vein: Secondary | ICD-10-CM | POA: Diagnosis not present

## 2020-08-02 DIAGNOSIS — I82462 Acute embolism and thrombosis of left calf muscular vein: Secondary | ICD-10-CM | POA: Diagnosis not present

## 2020-08-03 ENCOUNTER — Inpatient Hospital Stay (HOSPITAL_BASED_OUTPATIENT_CLINIC_OR_DEPARTMENT_OTHER): Payer: Medicare Other | Admitting: Hematology & Oncology

## 2020-08-03 ENCOUNTER — Other Ambulatory Visit: Payer: Self-pay

## 2020-08-03 ENCOUNTER — Encounter: Payer: Self-pay | Admitting: Hematology & Oncology

## 2020-08-03 ENCOUNTER — Ambulatory Visit (HOSPITAL_BASED_OUTPATIENT_CLINIC_OR_DEPARTMENT_OTHER)
Admission: RE | Admit: 2020-08-03 | Discharge: 2020-08-03 | Disposition: A | Discharge status: Home / Self Care | Payer: Medicare Other | Source: Ambulatory Visit | Attending: Hematology & Oncology | Admitting: Hematology & Oncology

## 2020-08-03 ENCOUNTER — Encounter (HOSPITAL_BASED_OUTPATIENT_CLINIC_OR_DEPARTMENT_OTHER): Payer: Self-pay

## 2020-08-03 ENCOUNTER — Inpatient Hospital Stay: Payer: Medicare Other

## 2020-08-03 ENCOUNTER — Inpatient Hospital Stay: Payer: Medicare Other | Attending: Hematology & Oncology

## 2020-08-03 VITALS — BP 134/84 | HR 71 | Temp 98.4°F | Resp 20 | Wt 146.8 lb

## 2020-08-03 DIAGNOSIS — R110 Nausea: Secondary | ICD-10-CM | POA: Insufficient documentation

## 2020-08-03 DIAGNOSIS — I2782 Chronic pulmonary embolism: Secondary | ICD-10-CM | POA: Insufficient documentation

## 2020-08-03 DIAGNOSIS — C4372 Malignant melanoma of left lower limb, including hip: Secondary | ICD-10-CM | POA: Insufficient documentation

## 2020-08-03 DIAGNOSIS — Z881 Allergy status to other antibiotic agents status: Secondary | ICD-10-CM | POA: Diagnosis not present

## 2020-08-03 DIAGNOSIS — M542 Cervicalgia: Secondary | ICD-10-CM | POA: Diagnosis not present

## 2020-08-03 DIAGNOSIS — I2699 Other pulmonary embolism without acute cor pulmonale: Secondary | ICD-10-CM | POA: Diagnosis not present

## 2020-08-03 DIAGNOSIS — Z95828 Presence of other vascular implants and grafts: Secondary | ICD-10-CM

## 2020-08-03 DIAGNOSIS — Z7901 Long term (current) use of anticoagulants: Secondary | ICD-10-CM | POA: Diagnosis not present

## 2020-08-03 DIAGNOSIS — M2550 Pain in unspecified joint: Secondary | ICD-10-CM | POA: Insufficient documentation

## 2020-08-03 DIAGNOSIS — R0602 Shortness of breath: Secondary | ICD-10-CM | POA: Insufficient documentation

## 2020-08-03 DIAGNOSIS — R5383 Other fatigue: Secondary | ICD-10-CM | POA: Diagnosis not present

## 2020-08-03 DIAGNOSIS — R600 Localized edema: Secondary | ICD-10-CM | POA: Diagnosis not present

## 2020-08-03 DIAGNOSIS — R42 Dizziness and giddiness: Secondary | ICD-10-CM | POA: Diagnosis not present

## 2020-08-03 DIAGNOSIS — I2602 Saddle embolus of pulmonary artery with acute cor pulmonale: Secondary | ICD-10-CM | POA: Insufficient documentation

## 2020-08-03 DIAGNOSIS — D500 Iron deficiency anemia secondary to blood loss (chronic): Secondary | ICD-10-CM

## 2020-08-03 DIAGNOSIS — Z880 Allergy status to penicillin: Secondary | ICD-10-CM | POA: Diagnosis not present

## 2020-08-03 DIAGNOSIS — Z79899 Other long term (current) drug therapy: Secondary | ICD-10-CM | POA: Insufficient documentation

## 2020-08-03 DIAGNOSIS — R002 Palpitations: Secondary | ICD-10-CM | POA: Insufficient documentation

## 2020-08-03 DIAGNOSIS — D509 Iron deficiency anemia, unspecified: Secondary | ICD-10-CM | POA: Insufficient documentation

## 2020-08-03 DIAGNOSIS — Z885 Allergy status to narcotic agent status: Secondary | ICD-10-CM | POA: Diagnosis not present

## 2020-08-03 DIAGNOSIS — R911 Solitary pulmonary nodule: Secondary | ICD-10-CM | POA: Diagnosis not present

## 2020-08-03 LAB — CBC WITH DIFFERENTIAL (CANCER CENTER ONLY)
Abs Immature Granulocytes: 0.14 10*3/uL — ABNORMAL HIGH (ref 0.00–0.07)
Basophils Absolute: 0 10*3/uL (ref 0.0–0.1)
Basophils Relative: 0 %
Eosinophils Absolute: 0 10*3/uL (ref 0.0–0.5)
Eosinophils Relative: 0 %
HCT: 39.1 % (ref 39.0–52.0)
Hemoglobin: 12.5 g/dL — ABNORMAL LOW (ref 13.0–17.0)
Immature Granulocytes: 2 %
Lymphocytes Relative: 8 %
Lymphs Abs: 0.7 10*3/uL (ref 0.7–4.0)
MCH: 30.3 pg (ref 26.0–34.0)
MCHC: 32 g/dL (ref 30.0–36.0)
MCV: 94.7 fL (ref 80.0–100.0)
Monocytes Absolute: 0.2 10*3/uL (ref 0.1–1.0)
Monocytes Relative: 3 %
Neutro Abs: 7.2 10*3/uL (ref 1.7–7.7)
Neutrophils Relative %: 87 %
Platelet Count: 186 10*3/uL (ref 150–400)
RBC: 4.13 MIL/uL — ABNORMAL LOW (ref 4.22–5.81)
RDW: 12.7 % (ref 11.5–15.5)
WBC Count: 8.3 10*3/uL (ref 4.0–10.5)
nRBC: 0 % (ref 0.0–0.2)

## 2020-08-03 LAB — CMP (CANCER CENTER ONLY)
ALT: 11 U/L (ref 0–44)
AST: 11 U/L — ABNORMAL LOW (ref 15–41)
Albumin: 3.8 g/dL (ref 3.5–5.0)
Alkaline Phosphatase: 44 U/L (ref 38–126)
Anion gap: 6 (ref 5–15)
BUN: 26 mg/dL — ABNORMAL HIGH (ref 8–23)
CO2: 36 mmol/L — ABNORMAL HIGH (ref 22–32)
Calcium: 9 mg/dL (ref 8.9–10.3)
Chloride: 95 mmol/L — ABNORMAL LOW (ref 98–111)
Creatinine: 1.19 mg/dL (ref 0.61–1.24)
GFR, Estimated: >60 mL/min (ref >60)
Glucose, Bld: 120 mg/dL — ABNORMAL HIGH (ref 70–99)
Potassium: 3.8 mmol/L (ref 3.5–5.1)
Sodium: 137 mmol/L (ref 135–145)
Total Bilirubin: 0.6 mg/dL (ref 0.3–1.2)
Total Protein: 6.1 g/dL — ABNORMAL LOW (ref 6.5–8.1)

## 2020-08-03 LAB — RETICULOCYTES
Immature Retic Fract: 13.3 % (ref 2.3–15.9)
RBC.: 4.14 MIL/uL — ABNORMAL LOW (ref 4.22–5.81)
Retic Count, Absolute: 61.3 10*3/uL (ref 19.0–186.0)
Retic Ct Pct: 1.5 % (ref 0.4–3.1)

## 2020-08-03 MED ORDER — HEPARIN SOD (PORK) LOCK FLUSH 100 UNIT/ML IV SOLN
500.0000 [IU] | Freq: Once | INTRAVENOUS | Status: AC
  Administered 2020-08-03: 500 [IU] via INTRAVENOUS
  Filled 2020-08-03: qty 5

## 2020-08-03 MED ORDER — SODIUM CHLORIDE 0.9% FLUSH
10.0000 mL | Freq: Once | INTRAVENOUS | Status: AC
  Administered 2020-08-03: 10 mL via INTRAVENOUS
  Filled 2020-08-03: qty 10

## 2020-08-03 MED ORDER — IOHEXOL 350 MG/ML SOLN
100.0000 mL | Freq: Once | INTRAVENOUS | Status: AC | PRN
  Administered 2020-08-03: 75 mL via INTRAVENOUS

## 2020-08-03 NOTE — Progress Notes (Signed)
Hematology and Oncology Follow Up Visit  [name] [id number] 10-27-1945 75 y.o. 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 Redder is here today with his wife for follow-up.  Unfortunately, looks like we now have recurrent melanoma.  He developed a lymph node in the left inguinal area.  We evaluated this.  Ultimately had a PET scan done.  This was done on 07/31/2020.  This showed a very active lymph node that was 2.4 cm in the left inguinal region.  This had an SUV of 9.  In addition, there was an area in his skin in the left upper thigh that measured 0.9 cm.  This an SUV of 2.9.  He will see Dr. Barry Dienes in a week or so and have resection of the lymph node and this medial thigh lesion.  We need to get the tissue for molecular studies.  I do not know if Dr. Barry Dienes will do a completion lymphadenectomy in the left inguinal region.  He also developed a DVT/PE.  He really was sick with some kind of virus back in early February.  He had a bilateral DVT in the legs.  He had a pulmonary embolism.  He is on Eliquis.  We repeated the CT angiogram at the Dopplers of his legs today.  The pulmonary embolism and the thromboembolic disease in his legs are gone.  I have to believe that somehow this thromboembolic event is somehow related to his recurrent melanoma.  He will stay on full dose anticoagulation probably for a year and then we will have him on maintenance  anticoagulation after that.  He feels well.  He is try to stay active.  He is having no problems with nausea or vomiting.  He is having no change in bowel or bladder habits.  There is no cough.  He has had no chest wall pain.  He has had some swelling in his lower legs.  This is been more of a chronic problem.  He is on diuretics for this.  Overall, his performance status is ECOG 1.    Medications:  Allergies as of 08/03/2020      Reactions   Cefepime Hives, Shortness Of Breath   Cephaeline Hives   Morphine Swelling   A swollen stomach.   Morphine And Related Shortness Of Breath, Nausea And Vomiting, Swelling, Other (See Comments)   Agitation, tolerates dilaudid Other reaction(s): Other (See Comments) Agitation, tolerates dilaudid   Penicillins Hives, Shortness Of Breath, Rash   Has patient had a PCN reaction causing immediate rash, facial/tongue/throat swelling, SOB or lightheadedness with hypotension: Yes Has patient had a PCN reaction causing severe rash involving mucus membranes or skin necrosis: Yes Has patient had a PCN reaction that required hospitalization No Has patient had a PCN reaction occurring within the last 10 years: No If all of the above answers are "NO", then may proceed with Cephalosporin use.   Doxycycline Rash   Oxycodone-acetaminophen Other (See Comments)   Patient doesn't remember what  type of reaction.      Medication List       Accurate as of August 03, 2020  5:18 PM. If you have any questions, ask your nurse or doctor.        albuterol 108 (90 Base) MCG/ACT inhaler Commonly known as: VENTOLIN HFA Inhale 1-2 puffs into the lungs every 4 (four) hours as needed for wheezing or shortness of breath.   ALPRAZolam 0.5 MG tablet Commonly known as: XANAX Take 1 tablet (0.5 mg total) by mouth 3 (three) times daily as needed for anxiety. for anxiety   Eliquis 5 MG Tabs tablet Generic drug: apixaban Take 5 mg by mouth 2 (two) times daily.   apixaban 5 MG  Tabs tablet Commonly known as: ELIQUIS Take 1 tablet (5 mg total) by mouth 2 (two) times daily.   B-6 PO Take 1 tablet by mouth.   FLUoxetine 40 MG capsule Commonly known as: PROZAC Take 1 capsule (40 mg total) by mouth daily. What changed: when to take this   fluticasone 50 MCG/ACT nasal spray Commonly known as: FLONASE INSTILL 2 SPRAYS IN EACH NOSTRIL EVERY DAY   gabapentin 300 MG capsule Commonly known as: NEURONTIN Take 2 capsules (600 mg total) by mouth at bedtime.   HYDROmorphone 2 MG tablet Commonly known as: DILAUDID Take 1-1.5 tablets (2-3 mg total) by mouth every 8 (eight) hours as needed for severe pain. What changed: Another medication with the same name was removed. Continue taking this medication, and follow the directions you see here. Changed by: [name], MD   lidocaine-prilocaine cream Commonly known as: EMLA Apply 1 application topically as needed.   Melatonin 10 MG Tabs Take 10 mg by mouth at bedtime. Takes 10 mg at HS   metoprolol tartrate 25 MG tablet Commonly known as: LOPRESSOR TAKE 1 TABLET (25 MG TOTAL) BY MOUTH 2 (TWO) TIMES DAILY.   pantoprazole 40 MG tablet Commonly known as: PROTONIX Take 1 tablet (40 mg total) by mouth every evening.   predniSONE 10 MG tablet Commonly known as: DELTASONE 20 mg po qam pc or as directed What changed:   how much to take  how to take this  when to take this  additional instructions   torsemide 100 MG tablet Commonly known as: DEMADEX Take 0.5-1 tablets (50-100 mg total) by mouth daily as needed.   traZODone 100 MG tablet Commonly known as: DESYREL TAKE 1 TABLET AT BEDTIME What changed:   how much to take  how to take this  when to take this  additional instructions   vitamin B-12 1000 MCG tablet Commonly known as: CYANOCOBALAMIN Take 1,000 mcg by mouth daily.   Vitamin D3 50 MCG (2000 UT) capsule Take 1 capsule (2,000 Units total) by mouth daily.       Allergies:   Allergies  Allergen Reactions  . Cefepime Hives and Shortness Of Breath  . Cephaeline Hives  . Morphine Swelling    A swollen stomach.  . Morphine And Related Shortness Of Breath, Nausea And Vomiting, Swelling and Other (See Comments)    Agitation, tolerates dilaudid Other reaction(s): Other (See Comments) Agitation, tolerates dilaudid  . Penicillins Hives, Shortness Of Breath and Rash    Has patient had a PCN reaction causing immediate rash, facial/tongue/throat swelling, SOB or lightheadedness with hypotension: Yes Has patient had a PCN reaction causing severe rash involving mucus membranes or skin necrosis: Yes Has patient had a PCN reaction that required hospitalization No Has patient had a PCN  reaction occurring within the last 10 years: No If all of the above answers are "NO", then may proceed with Cephalosporin use.   [name] Doxycycline Rash  . Oxycodone-Acetaminophen Other (See Comments)    Patient doesn't remember what type of reaction.    Past Medical History, Surgical history, Social history, and Family History were reviewed and updated.  Review of Systems: Review of Systems  Constitutional: Positive for malaise/fatigue.  HENT: Negative.   Eyes: Negative.   Respiratory: Positive for shortness of breath.   Cardiovascular: Positive for palpitations.  Gastrointestinal: Positive for nausea.  Genitourinary: Negative.   Musculoskeletal: Positive for joint pain and neck pain.  Skin: Negative.   Neurological: Positive for dizziness.  Endo/Heme/Allergies: Bruises/bleeds easily.  Psychiatric/Behavioral: Negative.       Physical Exam:  weight is 146 lb 12.8 oz (66.6 kg). His oral temperature is 98.4 F (36.9 C). His blood pressure is 134/84 and his pulse is 71. His respiration is 20 and oxygen saturation is 97%.   Wt Readings from Last 3 Encounters:  08/03/20 146 lb 12.8 oz (66.6 kg)  07/31/20 150 lb (68 kg)  07/05/20 154 lb 12.8 oz (70.2 kg)    Physical Exam Vitals  reviewed.  HENT:     Head: Normocephalic and atraumatic.  Eyes:     Pupils: Pupils are equal, round, and reactive to light.  Cardiovascular:     Rate and Rhythm: Normal rate and regular rhythm.     Heart sounds: Normal heart sounds.  Pulmonary:     Effort: Pulmonary effort is normal.     Breath sounds: Normal breath sounds.  Abdominal:     General: Bowel sounds are normal.     Palpations: Abdomen is soft.     Comments: Abdominal exam does show a palpable lymph node in the left inguinal region.  This is nontender.  It measures about 2 cm.  It is mobile.  Musculoskeletal:        General: No tenderness or deformity. Normal range of motion.     Cervical back: Normal range of motion.     Comments: In the medial upper left thigh.  There is a small subcutaneous nodule.  It measures about 1 cm.  It is mobile and nontender.  Lymphadenopathy:     Cervical: No cervical adenopathy.  Skin:    General: Skin is warm and dry.     Findings: No erythema or rash.  Neurological:     Mental Status: He is alert and oriented to person, place, and time.  Psychiatric:        Behavior: Behavior normal.        Thought Content: Thought content normal.        Judgment: Judgment normal.    Lab Results  Component Value Date   WBC 8.3 08/03/2020   HGB 12.5 (L) 08/03/2020   HCT 39.1 08/03/2020   MCV 94.7 08/03/2020   PLT 186 08/03/2020   Lab Results  Component Value Date   FERRITIN 103 06/21/2020   IRON 70 06/21/2020   TIBC 196 (L) 06/21/2020   UIBC 127 06/21/2020   IRONPCTSAT 36 06/21/2020   Lab Results  Component Value Date   RETICCTPCT 1.5 08/03/2020   RBC 4.13 (L) 08/03/2020   RBC 4.14 (L) 08/03/2020   No results found for: KPAFRELGTCHN, LAMBDASER, KAPLAMBRATIO No results found for: IGGSERUM, IGA, IGMSERUM No results found for: TOTALPROTELP, ALBUMINELP, A1GS, A2GS, BETS, BETA2SER, GAMS, MSPIKE, SPEI   Chemistry      Component  Value Date/Time   NA 137 08/03/2020 1330   NA 140  04/10/2017 1046   NA 135 (L) 03/06/2017 1104   K 3.8 08/03/2020 1330   K 3.7 04/10/2017 1046   K 4.2 03/06/2017 1104   CL 95 (L) 08/03/2020 1330   CL 98 04/10/2017 1046   CO2 36 (H) 08/03/2020 1330   CO2 31 04/10/2017 1046   CO2 30 (H) 03/06/2017 1104   BUN 26 (H) 08/03/2020 1330   BUN 21 04/10/2017 1046   BUN 20.0 03/06/2017 1104   CREATININE 1.19 08/03/2020 1330   CREATININE 0.9 04/10/2017 1046   CREATININE 1.0 03/06/2017 1104      Component Value Date/Time   CALCIUM 9.0 08/03/2020 1330   CALCIUM 8.9 04/10/2017 1046   CALCIUM 9.4 03/06/2017 1104   ALKPHOS 44 08/03/2020 1330   ALKPHOS 65 04/10/2017 1046   ALKPHOS 78 03/06/2017 1104   AST 11 (L) 08/03/2020 1330   AST 20 03/06/2017 1104   ALT 11 08/03/2020 1330   ALT 21 04/10/2017 1046   ALT 20 03/06/2017 1104   BILITOT 0.6 08/03/2020 1330   BILITOT 0.47 03/06/2017 1104       Impression and Plan:   Mr. Arons is a very pleasant 74yo caucasian gentleman with history of hemochromatosis,homozygous for the C282Y mutation which has not been a problem for himso far.   He did have melanoma.  This actually was of the left knee.  He did have some sentinel nodes were taken after the left groin.  This was back in 2018.  He now has recurrence in the left inguinal region.  Will be interesting to see what this left medial thigh lesion is.  I would not think that this is in-transit melanoma.  Again, he needs to have the lymph node resected out.  We will have to get molecular markers to see if this tumor is BRAF positive.  I think initial tumor was BRAF negative.  I think you will need radiation therapy to the left inguinal region after he has the surgery for removal of the left inguinal node.  We will have to get this set up after he has the surgery and will get the path report back.  I just hate that he has had recurrence.  I talked he and his wife at length.  They both understand what is the situation.  I still would like to  think that we are dealing with a curable malignancy.  By the PET scan there is no disease anywhere else.  I will plan to see him back in late May.    [name], MD 4/28/20225:18 PM

## 2020-08-03 NOTE — Patient Instructions (Signed)
Tunneled Central Venous Catheter Flushing Guide  It is important to flush your tunneled central venous catheter each time you use it, both before and after you use it. Flushing your catheter will help prevent it from clogging. What are the risks? Risks may include:  Infection.  Air getting into the catheter and bloodstream. Supplies needed:  A clean pair of gloves.  A disinfecting wipe. Use an alcohol wipe, chlorhexidine wipe, or iodine wipe as told by your health care provider.  A 10 mL syringe that has been prefilled with saline solution.  An empty 10 mL syringe, if a substance called heparin was injected into your catheter. How to flush your catheter When you flush your catheter, make sure you follow any specific instructions from your health care provider or the manufacturer. These are general guidelines. Flushing your catheter before use If there is heparin in your catheter: 1. Wash your hands with soap and water. 2. Put on gloves. 3. Scrub the injection cap for a minimum of 15 seconds with a disinfecting wipe. 4. Unclamp the catheter. 5. Attach the empty syringe to the injection cap. 6. Pull the syringe plunger back and withdraw 10 mL of blood. 7. Place the syringe into an appropriate waste container. 8. Scrub the injection cap for 15 seconds with a disinfecting wipe. 9. Attach the prefilled syringe to the injection cap. 10. Flush the catheter by pushing the plunger forward until all the liquid from the syringe is in the catheter. 11. Remove the syringe from the injection cap. 12. Clamp the catheter. If there is no heparin in your catheter: 1. Wash your hands with soap and water. 2. Put on gloves. 3. Scrub the injection cap for 15 seconds with a disinfecting wipe. 4. Unclamp the catheter. 5. Attach the prefilled syringe to the injection cap. 6. Flush the catheter by pushing the plunger forward until 5 mL of the liquid from the syringe is in the catheter. 7. Pull back on  the syringe until you see blood in the catheter. 8. If you have been asked to collect any blood, follow your health care provider's instructions. Otherwise, flush the catheter with the rest of the solution from the syringe. 9. Remove the syringe from the injection cap. 10. Clamp the catheter.   Flushing your catheter after use 1. Wash your hands with soap and water. 2. Put on gloves. 3. Scrub the injection cap for 15 seconds with a disinfecting wipe. 4. Unclamp the catheter. 5. Attach the prefilled syringe to the injection cap. 6. Flush the catheter by pushing the plunger forward until all of the liquid from the syringe is in the catheter. 7. Remove the syringe from the injection cap. 8. Clamp the catheter. Problems and solutions  If blood cannot be completely cleared from the injection cap, you may need to have the injection cap replaced.  If the catheter is difficult to flush, use the pulsing method. The pulsing method involves pushing only a few milliliters of solution into the catheter at a time and pausing between pushes.  If you do not see blood in the catheter when you pull back on the syringe, change your body position, such as by raising your arms above your head. Take a deep breath and cough. Then, pull back on the syringe. If you still do not see blood, flush the catheter with a small amount of solution. Then, change positions again and take a breath or cough. Pull back on the syringe again. If you still do not  see blood, finish flushing the catheter and contact your health care provider. Do not use your catheter until your health care provider says it is okay. General tips  Have someone help you flush your catheter, if possible.  Do not force fluid through your catheter.  Do not use a syringe that is larger or smaller than 10 mL. Using a smaller syringe can make the catheter burst.  Do not use your catheter without flushing it first if it has heparin in it. Contact a health  care provider if:  You cannot see any blood in the catheter when you flush it before using it.  Your catheter is difficult to flush. Get help right away if:  You cannot flush the catheter.  The catheter leaks when you flush it or when there is fluid in it.  There are cracks or breaks in the catheter. Summary  It is important to flush your tunneled central venous catheter each time you use it, both before and after you use it.  Scrub the injection cap for 15 seconds with a disinfecting wipe before and after you flush it.  When you flush your catheter, make sure you follow any specific instructions from your health care provider or the manufacturer.  Get help right away if you cannot flush the catheter. This information is not intended to replace advice given to you by your health care provider. Make sure you discuss any questions you have with your health care provider. Document Revised: 06/03/2019 Document Reviewed: 06/10/2018 Elsevier Patient Education  [name].

## 2020-08-04 ENCOUNTER — Telehealth: Payer: Self-pay | Admitting: *Deleted

## 2020-08-04 ENCOUNTER — Other Ambulatory Visit (HOSPITAL_BASED_OUTPATIENT_CLINIC_OR_DEPARTMENT_OTHER): Payer: Medicare Other

## 2020-08-04 LAB — FERRITIN: Ferritin: 60 ng/mL (ref 24–336)

## 2020-08-04 LAB — IRON AND TIBC
Iron: 69 ug/dL (ref 42–163)
Saturation Ratios: 30 % (ref 20–55)
TIBC: 226 ug/dL (ref 202–409)
UIBC: 157 ug/dL (ref 117–376)

## 2020-08-04 NOTE — Telephone Encounter (Signed)
Per 08/03/20 los - called patient and lvm of upcoming appointments - mailed calendar

## 2020-08-08 ENCOUNTER — Other Ambulatory Visit: Payer: Self-pay

## 2020-08-08 ENCOUNTER — Encounter: Payer: Self-pay | Admitting: Internal Medicine

## 2020-08-08 ENCOUNTER — Other Ambulatory Visit: Payer: Self-pay | Admitting: General Surgery

## 2020-08-08 ENCOUNTER — Ambulatory Visit (INDEPENDENT_AMBULATORY_CARE_PROVIDER_SITE_OTHER): Payer: Medicare Other | Admitting: Internal Medicine

## 2020-08-08 DIAGNOSIS — C779 Secondary and unspecified malignant neoplasm of lymph node, unspecified: Secondary | ICD-10-CM | POA: Diagnosis not present

## 2020-08-08 DIAGNOSIS — C439 Malignant melanoma of skin, unspecified: Secondary | ICD-10-CM | POA: Diagnosis not present

## 2020-08-08 DIAGNOSIS — Z79899 Other long term (current) drug therapy: Secondary | ICD-10-CM

## 2020-08-08 DIAGNOSIS — M05711 Rheumatoid arthritis with rheumatoid factor of right shoulder without organ or systems involvement: Secondary | ICD-10-CM | POA: Diagnosis not present

## 2020-08-08 DIAGNOSIS — F419 Anxiety disorder, unspecified: Secondary | ICD-10-CM | POA: Diagnosis not present

## 2020-08-08 DIAGNOSIS — I480 Paroxysmal atrial fibrillation: Secondary | ICD-10-CM | POA: Diagnosis not present

## 2020-08-08 DIAGNOSIS — R222 Localized swelling, mass and lump, trunk: Secondary | ICD-10-CM | POA: Diagnosis not present

## 2020-08-08 DIAGNOSIS — C4372 Malignant melanoma of left lower limb, including hip: Secondary | ICD-10-CM

## 2020-08-08 MED ORDER — ALPRAZOLAM 0.5 MG PO TABS: 0.5000 mg | ORAL_TABLET | Freq: Three times a day (TID) | ORAL | 1 refills | Status: DC | PRN

## 2020-08-08 MED ORDER — HYDROMORPHONE HCL 2 MG PO TABS: 2.0000 mg | ORAL_TABLET | Freq: Three times a day (TID) | ORAL | 0 refills | Status: DC | PRN

## 2020-08-08 MED ORDER — FLUOXETINE HCL 40 MG PO CAPS: 40.0000 mg | ORAL_CAPSULE | Freq: Two times a day (BID) | ORAL | 1 refills | Status: DC

## 2020-08-08 MED ORDER — TRAZODONE HCL 100 MG PO TABS: 150.0000 mg | ORAL_TABLET | Freq: Every day | ORAL | 5 refills | Status: DC

## 2020-08-08 MED ORDER — FLUOXETINE HCL 60 MG PO TABS: ORAL_TABLET | ORAL | 1 refills | Status: DC

## 2020-08-08 NOTE — Progress Notes (Signed)
Subjective:  Patient ID: [name], male    DOB: June 13, 1945  Age: 75 y.o. MRN: [id number]  CC: Follow-up (4 WEEK F/U- Pt want to discuss prednisone & want to increase his trazodone to 150 mg qhs)   HPI [name] presents for melanoma, chronic pain, anxiety and depression  F/u.  Per hx: Pt developed a lymph node in the left inguinal area. He had a PET scan done.  This was done on 07/31/2020.  This showed a very active lymph node that was 2.4 cm in the left inguinal region. Surgery is planned for the next week followed by XRT.  ER visit on 05/29/20 was reviewed  Outpatient Medications Prior to Visit  Medication Sig Dispense Refill  . albuterol (PROVENTIL HFA;VENTOLIN HFA) 108 (90 Base) MCG/ACT inhaler Inhale 1-2 puffs into the lungs every 4 (four) hours as needed for wheezing or shortness of breath. 1 Inhaler 1  . ALPRAZolam (XANAX) 0.5 MG tablet Take 1 tablet (0.5 mg total) by mouth 3 (three) times daily as needed for anxiety. for anxiety 180 tablet 1  . Cholecalciferol (VITAMIN D3) 2000 UNITS capsule Take 1 capsule (2,000 Units total) by mouth daily. 100 capsule 3  . FLUoxetine (PROZAC) 40 MG capsule Take 1 capsule (40 mg total) by mouth daily. (Patient taking differently: Take 40 mg by mouth 2 (two) times daily.) 90 capsule 2  . fluticasone (FLONASE) 50 MCG/ACT nasal spray INSTILL 2 SPRAYS IN EACH NOSTRIL EVERY DAY 16 g 11  . gabapentin (NEURONTIN) 300 MG capsule Take 2 capsules (600 mg total) by mouth at bedtime. 180 capsule 2  . HYDROmorphone (DILAUDID) 2 MG tablet Take 1-1.5 tablets (2-3 mg total) by mouth every 8 (eight) hours as needed for severe pain. 90 tablet 0  . lidocaine-prilocaine (EMLA) cream Apply 1 application topically as needed. 30 g 5  . Melatonin 10 MG TABS Take 10 mg by mouth at bedtime. Takes 10 mg at HS    . metoprolol tartrate (LOPRESSOR) 25 MG tablet TAKE 1 TABLET (25 MG TOTAL) BY MOUTH 2 (TWO) TIMES DAILY. 180 tablet 3  . pantoprazole (PROTONIX) 40  MG tablet Take 1 tablet (40 mg total) by mouth every evening. 90 tablet 3  . predniSONE (DELTASONE) 10 MG tablet 20 mg po qam pc or as directed (Patient taking differently: Take 20 mg by mouth in the morning. Taking for back) 60 tablet 1  . Pyridoxine HCl (B-6 PO) Take 1 tablet by mouth.    . torsemide (DEMADEX) 100 MG tablet Take 0.5-1 tablets (50-100 mg total) by mouth daily as needed. 90 tablet 1  . traZODone (DESYREL) 100 MG tablet TAKE 1 TABLET AT BEDTIME (Patient taking differently: 150 mg. TAKE 1 TABLET AT BEDTIME) 90 tablet 3  . vitamin B-12 (CYANOCOBALAMIN) 1000 MCG tablet Take 1,000 mcg by mouth daily.     [name] apixaban (ELIQUIS) 5 MG TABS tablet Take 1 tablet (5 mg total) by mouth 2 (two) times daily. 180 tablet 2  . apixaban (ELIQUIS) 5 MG TABS tablet Take 5 mg by mouth 2 (two) times daily.     Facility-Administered Medications Prior to Visit  Medication Dose Route Frequency Provider Last Rate Last Admin  . furosemide (LASIX) injection 20 mg  20 mg Intravenous Once [place], Sarah M, NP      . heparin lock flush 100 unit/mL  250 Units Intracatheter PRN [place], Holli Humbles, NP      . sodium chloride flush (NS) 0.9 % injection 10 mL  10 mL Intracatheter PRN Ferguson, NP        ROS: Review of Systems  Constitutional: Positive for fatigue. Negative for appetite change and unexpected weight change.  HENT: Negative for congestion, nosebleeds, sneezing, sore throat and trouble swallowing.   Eyes: Negative for itching and visual disturbance.  Respiratory: Negative for cough.   Cardiovascular: Negative for chest pain, palpitations and leg swelling.  Gastrointestinal: Negative for abdominal distention, blood in stool, diarrhea and nausea.  Genitourinary: Negative for frequency and hematuria.  Musculoskeletal: Positive for arthralgias, back pain, gait problem, neck pain and neck stiffness. Negative for joint swelling.  Skin: Negative for rash.  Neurological: Negative for  dizziness, tremors, speech difficulty and weakness.  Hematological: Positive for adenopathy. Bruises/bleeds easily.  Psychiatric/Behavioral: Negative for agitation, dysphoric mood, sleep disturbance and suicidal ideas. The patient is not nervous/anxious.     Objective:  BP 120/72 (BP Location: Left Arm)   Pulse 66   Temp 98.2 F (36.8 C) (Oral)   Ht 5' 6"  (1.676 m)   Wt 145 lb 9.6 oz (66 kg)   SpO2 94%   BMI 23.50 kg/m   BP Readings from Last 3 Encounters:  08/08/20 120/72  08/03/20 134/84  07/05/20 140/86    Wt Readings from Last 3 Encounters:  08/08/20 145 lb 9.6 oz (66 kg)  08/03/20 146 lb 12.8 oz (66.6 kg)  07/31/20 150 lb (68 kg)    Physical Exam Constitutional:      General: He is not in acute distress.    Appearance: He is well-developed.     Comments: NAD  Eyes:     Conjunctiva/sclera: Conjunctivae normal.     Pupils: Pupils are equal, round, and reactive to light.  Neck:     Thyroid: No thyromegaly.     Vascular: No JVD.  Cardiovascular:     Rate and Rhythm: Normal rate and regular rhythm.     Heart sounds: Normal heart sounds. No murmur heard. No friction rub. No gallop.   Pulmonary:     Effort: Pulmonary effort is normal. No respiratory distress.     Breath sounds: Normal breath sounds. No wheezing or rales.  Chest:     Chest wall: No tenderness.  Abdominal:     General: Bowel sounds are normal. There is no distension.     Palpations: Abdomen is soft. There is no mass.     Tenderness: There is no abdominal tenderness. There is no guarding or rebound.  Musculoskeletal:        General: Tenderness present. Normal range of motion.     Cervical back: Normal range of motion.  Lymphadenopathy:     Cervical: No cervical adenopathy.  Skin:    General: Skin is warm and dry.     Findings: No rash.  Neurological:     Mental Status: He is alert and oriented to person, place, and time.     Cranial Nerves: No cranial nerve deficit.     Motor: No abnormal  muscle tone.     Coordination: Coordination abnormal.     Gait: Gait abnormal.     Deep Tendon Reflexes: Reflexes are normal and symmetric.  Psychiatric:        Behavior: Behavior normal.        Thought Content: Thought content normal.        Judgment: Judgment normal.    RA deformities LS w/pain Swollen LN L groin, a nodule on the L inner mid-thigh    A total time  of >45 minutes was spent preparing to see the patient, reviewing tests, x-rays/PET CT, operative reports and outside records.  Also, obtaining history and performing comprehensive physical exam.  Additionally, counseling the patient regarding chronic pain, anxiety, med use.   Finally, documenting clinical information in the health records, coordination of care; surgery plans, educating the patient. It is a complex case.   Lab Results  Component Value Date   WBC 8.3 08/03/2020   HGB 12.5 (L) 08/03/2020   HCT 39.1 08/03/2020   PLT 186 08/03/2020   GLUCOSE 120 (H) 08/03/2020   CHOL 128 03/10/2015   TRIG 95.0 03/10/2015   HDL 39.30 03/10/2015   LDLCALC 70 03/10/2015   ALT 11 08/03/2020   AST 11 (L) 08/03/2020   NA 137 08/03/2020   K 3.8 08/03/2020   CL 95 (L) 08/03/2020   CREATININE 1.19 08/03/2020   BUN 26 (H) 08/03/2020   CO2 36 (H) 08/03/2020   TSH 0.680 09/18/2017   PSA 1.95 10/15/2010   INR 1.1 (H) 06/14/2014   HGBA1C 6.0 09/28/2015    CT ANGIO CHEST PE W OR WO CONTRAST  Result Date: 08/03/2020 CLINICAL DATA:  History of pulmonary embolus. EXAM: CT ANGIOGRAPHY CHEST WITH CONTRAST TECHNIQUE: Multidetector CT imaging of the chest was performed using the standard protocol during bolus administration of intravenous contrast. Multiplanar CT image reconstructions and MIPs were obtained to evaluate the vascular anatomy. CONTRAST:  8m OMNIPAQUE IOHEXOL 350 MG/ML SOLN COMPARISON:  05/26/2020. FINDINGS: Cardiovascular: The heart size is normal. No substantial pericardial effusion. Coronary artery calcification is  evident. Atherosclerotic calcification is noted in the wall of the thoracic aorta. Enlargement of the pulmonary outflow tract and main pulmonary arteries suggests pulmonary arterial hypertension. Tiny right upper lobe pulmonary embolus seen previously has resolved in the interval no new filling defect within the opacified pulmonary arteries to suggest the presence of acute pulmonary embolus. Mediastinum/Nodes: No mediastinal lymphadenopathy. There is no hilar lymphadenopathy. The esophagus has normal imaging features. There is no axillary lymphadenopathy. Lungs/Pleura: Similar appearance of marked elevation right hemidiaphragm. Complete collapse right middle lobe again noted with substantial left lower lobe collapse/consolidative opacity, similar to prior. 11 mm ground-glass opacity in the anterior right upper lobe on 37/6 is stable. 9 mm ground-glass opacity in the anterior left upper lobe on 38/6 is also similar to prior. No pulmonary edema or pleural effusion. Upper Abdomen: Stable right upper pole and central sinus cysts in the right kidney. Musculoskeletal: No worrisome lytic or sclerotic osseous abnormality. 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 Review of the MIP images confirms the above findings. IMPRESSION: 1. Interval resolution of the tiny right upper lobe pulmonary embolus seen previously. No new pulmonary embolus evident. 2. Marked elevation right hemidiaphragm with complete collapse right middle lobe and substantial right lower lobe collapse/consolidative opacity, findings unchanged in the interval. 3. Stable bilateral ground-glass pulmonary nodules measuring up to 11 mm. Initial follow-up with CT at 6-12 months is recommended to confirm persistence. If persistent, repeat CT is recommended every 2 years until 5 years of stability has been established. This recommendation follows the consensus statement: Guidelines for Management of Incidental Pulmonary Nodules Detected on CT Images:  From the Fleischner Society 2017; Radiology 2017; 284:228-243. 4. Enlargement of the pulmonary outflow tract and main pulmonary arteries suggests pulmonary arterial hypertension. 5. Aortic Atherosclerosis (ICD10-I70.0). Electronically Signed   By: EMisty StanleyM.D.   On: 08/03/2020 15:06   [place]ous Img Lower Bilateral (DVT)  Result Date: 08/03/2020 CLINICAL  DATA:  History of lower extremity DVT, currently on anticoagulation. Evaluate for acute or chronic DVT. EXAM: BILATERAL LOWER EXTREMITY VENOUS DOPPLER ULTRASOUND TECHNIQUE: Gray-scale sonography with graded compression, as well as color Doppler and duplex ultrasound were performed to evaluate the lower extremity deep venous systems from the level of the common femoral vein and including the common femoral, femoral, profunda femoral, popliteal and calf veins including the posterior tibial, peroneal and gastrocnemius veins when visible. The superficial great saphenous vein was also interrogated. Spectral Doppler was utilized to evaluate flow at rest and with distal augmentation maneuvers in the common femoral, femoral and popliteal veins. COMPARISON:  Left lower extremity venous Doppler ultrasound-07/24/2017 (negative). PET-CT-07/31/2020 FINDINGS: RIGHT LOWER EXTREMITY Common Femoral Vein: No evidence of thrombus. Normal compressibility, respiratory phasicity and response to augmentation. Saphenofemoral Junction: No evidence of thrombus. Normal compressibility and flow on color Doppler imaging. Profunda Femoral Vein: No evidence of thrombus. Normal compressibility and flow on color Doppler imaging. Femoral Vein: No evidence of thrombus. Normal compressibility, respiratory phasicity and response to augmentation. Popliteal Vein: No evidence of thrombus. Normal compressibility, respiratory phasicity and response to augmentation. Calf Veins: No evidence of thrombus. Normal compressibility and flow on color Doppler imaging. Superficial Great Saphenous Vein: No  evidence of thrombus. Normal compressibility. Venous Reflux:  None. Other Findings: There is a moderate amount of subcutaneous edema at the level of the right lower leg and ankle. LEFT LOWER EXTREMITY Common Femoral Vein: No evidence of thrombus. Normal compressibility, respiratory phasicity and response to augmentation. Saphenofemoral Junction: No evidence of thrombus. Normal compressibility and flow on color Doppler imaging. Profunda Femoral Vein: No evidence of thrombus. Normal compressibility and flow on color Doppler imaging. Femoral Vein: No evidence of thrombus. Normal compressibility, respiratory phasicity and response to augmentation. Popliteal Vein: No evidence of thrombus. Normal compressibility, respiratory phasicity and response to augmentation. Calf Veins: No evidence of thrombus. Normal compressibility and flow on color Doppler imaging. Superficial Great Saphenous Vein: No evidence of thrombus. Normal compressibility. Venous Reflux:  None. Other Findings: Note is made of a malignant appearing approximately 2.2 x 1.9 cm left inguinal lymph node (image 42), correlating with the lymph node seen on preceding PET-CT image 205, series 4. there is a moderate amount of subcutaneous edema at the level of the left and lower leg. IMPRESSION: 1. No evidence of acute or chronic DVT within either lower extremity. 2. Malignant appearing left inguinal lymph node, similar to PET-CT performed 07/31/2020. Electronically Signed   By: Sandi Mariscal M.D.   On: 08/03/2020 15:03    Assessment & Plan:    [name], MD

## 2020-08-08 NOTE — Assessment & Plan Note (Signed)
Pt developed a lymph node in the left inguinal area. He had a PET scan done.  This was done on 07/31/2020.  This showed a very active lymph node that was 2.4 cm in the left inguinal region. Surgery is planned for the next week followed by XRT.

## 2020-08-08 NOTE — Assessment & Plan Note (Signed)
Chronic pain  Potential benefits of a long term opioids use as well as potential risks (i.e. addiction risk, apnea etc) and complications (i.e. Somnolence, constipation and others) were explained to the patient and were aknowledged.

## 2020-08-08 NOTE — Assessment & Plan Note (Addendum)
Xanax prn  Potential benefits of a long term benzodiazepines  use as well as potential risks  and complications were explained to the patient and were aknowledged. Will try Fluoxetine 60 mg a day

## 2020-08-08 NOTE — Assessment & Plan Note (Signed)
Discussed risks, drug interactions

## 2020-08-08 NOTE — Assessment & Plan Note (Signed)
On Eliquis

## 2020-08-09 DIAGNOSIS — I82461 Acute embolism and thrombosis of right calf muscular vein: Secondary | ICD-10-CM | POA: Diagnosis not present

## 2020-08-09 DIAGNOSIS — I2510 Atherosclerotic heart disease of native coronary artery without angina pectoris: Secondary | ICD-10-CM | POA: Diagnosis not present

## 2020-08-09 DIAGNOSIS — J449 Chronic obstructive pulmonary disease, unspecified: Secondary | ICD-10-CM | POA: Diagnosis not present

## 2020-08-09 DIAGNOSIS — I10 Essential (primary) hypertension: Secondary | ICD-10-CM | POA: Diagnosis not present

## 2020-08-09 DIAGNOSIS — I82462 Acute embolism and thrombosis of left calf muscular vein: Secondary | ICD-10-CM | POA: Diagnosis not present

## 2020-08-09 DIAGNOSIS — I2699 Other pulmonary embolism without acute cor pulmonale: Secondary | ICD-10-CM | POA: Diagnosis not present

## 2020-08-09 NOTE — Progress Notes (Addendum)
COVID Vaccine Completed: Yes Date COVID Vaccine completed: x2 Has received booster:Yes COVID vaccine manufacturer: Moderna    Date of COVID positive in last 90 days: No  PCP - Cassandria Anger, MD Cardiologist - Jenkins Rouge, MD   Chest x-ray - 05/26/2020 in epic EKG - 05/26/2020 in epic Stress Test -  greater than 2 years ECHO -  Cardiac Cath - greater than 2 years Pacemaker/ICD device last checked:N/A  Sleep Study - N/A CPAP - N/A  Fasting Blood Sugar - N/A Checks Blood Sugar __N/A____ times a day  Blood Thinner Instructions:  Eliquis Hold 2 days Aspirin Instructions: N/A Last Dose: N/A  Activity level:  Can walk a city block and able activities of daily living without stopping and without symptoms    Anesthesia review: CAD, History of MI, CHF,   Patient denies shortness of breath, fever, cough and chest pain at PAT appointment   Patient verbalized understanding of instructions that were given to them at the PAT appointment. Patient was also instructed that they will need to review over the PAT instructions again at home before surgery.

## 2020-08-10 ENCOUNTER — Other Ambulatory Visit: Payer: Self-pay

## 2020-08-10 ENCOUNTER — Encounter (HOSPITAL_COMMUNITY): Payer: Self-pay | Admitting: General Surgery

## 2020-08-11 ENCOUNTER — Other Ambulatory Visit (HOSPITAL_COMMUNITY)
Admission: RE | Admit: 2020-08-11 | Discharge: 2020-08-11 | Disposition: A | Discharge status: Home / Self Care | Payer: Medicare Other | Source: Ambulatory Visit | Attending: General Surgery | Admitting: General Surgery

## 2020-08-11 DIAGNOSIS — Z20822 Contact with and (suspected) exposure to covid-19: Secondary | ICD-10-CM | POA: Diagnosis not present

## 2020-08-11 DIAGNOSIS — Z01812 Encounter for preprocedural laboratory examination: Secondary | ICD-10-CM | POA: Diagnosis not present

## 2020-08-12 LAB — SARS CORONAVIRUS 2 (TAT 6-24 HRS): SARS Coronavirus 2: NEGATIVE

## 2020-08-14 NOTE — Anesthesia Preprocedure Evaluation (Addendum)
Anesthesia Evaluation  Patient identified by MRN, date of birth, ID band Patient awake    Reviewed: Allergy & Precautions, H&P , NPO status , Patient's Chart, lab work & pertinent test results, reviewed documented beta blocker date and time   History of Anesthesia Complications (+) PONV and history of anesthetic complications  Airway Mallampati: II  TM Distance: >3 FB Neck ROM: Full    Dental  (+) Teeth Intact, Dental Advisory Given   Pulmonary shortness of breath, COPD,    breath sounds clear to auscultation       Cardiovascular hypertension, Pt. on medications and Pt. on home beta blockers + angina + CAD, + Past MI, +CHF and + DOE  + dysrhythmias  Rhythm:Regular Rate:Normal     Neuro/Psych  Headaches, PSYCHIATRIC DISORDERS Anxiety Depression  Neuromuscular disease    GI/Hepatic PUD, GERD  Medicated,(+)     substance abuse  alcohol use,   Endo/Other    Renal/GU      Musculoskeletal  (+) Arthritis , Osteoarthritis,    Abdominal   Peds  Hematology  (+) anemia ,   Anesthesia Other Findings Melanoma  Reproductive/Obstetrics                           Anesthesia Physical  Anesthesia Plan  ASA: IV  Anesthesia Plan: General   Post-op Pain Management:    Induction: Intravenous  PONV Risk Score and Plan: 3 and Ondansetron, Dexamethasone, Midazolam, Treatment may vary due to age or medical condition and Diphenhydramine  Airway Management Planned: LMA  Additional Equipment:   Intra-op Plan:   Post-operative Plan: Extubation in OR  Informed Consent: I have reviewed the patients History and Physical, chart, labs and discussed the procedure including the risks, benefits and alternatives for the proposed anesthesia with the patient or authorized representative who has indicated his/her understanding and acceptance.     Dental advisory given  Plan Discussed with: CRNA  Anesthesia  Plan Comments: (CAD S/P PTCA 2006, (-) nuclear stress test 2012 H/O paroxysmal Afib 2012, EF 60% on TEE Hypokalemia  Plan GA with oral ETT)      Anesthesia Quick Evaluation                                   Anesthesia Evaluation  Patient identified by MRN, date of birth, ID band Patient awake    Reviewed: Allergy & Precautions, H&P , NPO status , Patient's Chart, lab work & pertinent test results, reviewed documented beta blocker date and time   History of Anesthesia Complications (+) PONV and history of anesthetic complications  Airway Mallampati: II  TM Distance: >3 FB Neck ROM: Full    Dental  (+) Teeth Intact, Dental Advisory Given   Pulmonary COPD,    breath sounds clear to auscultation       Cardiovascular hypertension, Pt. on medications and Pt. on home beta blockers  Rhythm:Regular Rate:Normal     Neuro/Psych Anxiety Depression  Neuromuscular disease    GI/Hepatic GERD  ,  Endo/Other    Renal/GU      Musculoskeletal  (+) Arthritis ,   Abdominal   Peds  Hematology  (+) anemia ,   Anesthesia Other Findings Melanoma  Reproductive/Obstetrics                             Anesthesia Physical  Anesthesia Plan  ASA: III  Anesthesia Plan: General   Post-op Pain Management:    Induction: Intravenous  PONV Risk Score and Plan: 3 and Ondansetron, Dexamethasone and Midazolam  Airway Management Planned: Oral ETT  Additional Equipment:   Intra-op Plan:   Post-operative Plan: Extubation in OR  Informed Consent: I have reviewed the patients History and Physical, chart, labs and discussed the procedure including the risks, benefits and alternatives for the proposed anesthesia with the patient or authorized representative who has indicated his/her understanding and acceptance.   Dental advisory given  Plan Discussed with: CRNA and Anesthesiologist  Anesthesia Plan Comments: (CAD S/P PTCA 2006, (-) nuclear  stress test 2012 H/O paroxysmal Afib 2012, EF 60% on TEE Hypokalemia  Plan GA with oral ETT)        Anesthesia Quick Evaluation

## 2020-08-15 ENCOUNTER — Ambulatory Visit (HOSPITAL_COMMUNITY)
Admission: RE | Admit: 2020-08-15 | Discharge: 2020-08-15 | Disposition: A | Discharge status: Home / Self Care | Payer: Medicare Other | Attending: General Surgery | Admitting: General Surgery

## 2020-08-15 ENCOUNTER — Ambulatory Visit (HOSPITAL_COMMUNITY): Payer: Medicare Other | Admitting: Certified Registered"

## 2020-08-15 ENCOUNTER — Encounter (HOSPITAL_COMMUNITY): Payer: Self-pay | Admitting: General Surgery

## 2020-08-15 ENCOUNTER — Encounter (HOSPITAL_COMMUNITY)
Admission: RE | Disposition: A | Discharge status: Home / Self Care | Payer: Self-pay | Source: Home / Self Care | Attending: General Surgery

## 2020-08-15 ENCOUNTER — Other Ambulatory Visit: Payer: Self-pay

## 2020-08-15 DIAGNOSIS — C4371 Malignant melanoma of right lower limb, including hip: Secondary | ICD-10-CM | POA: Diagnosis not present

## 2020-08-15 DIAGNOSIS — Z7952 Long term (current) use of systemic steroids: Secondary | ICD-10-CM | POA: Insufficient documentation

## 2020-08-15 DIAGNOSIS — I700 Atherosclerosis of aorta: Secondary | ICD-10-CM | POA: Diagnosis not present

## 2020-08-15 DIAGNOSIS — Z885 Allergy status to narcotic agent status: Secondary | ICD-10-CM | POA: Diagnosis not present

## 2020-08-15 DIAGNOSIS — I10 Essential (primary) hypertension: Secondary | ICD-10-CM | POA: Diagnosis not present

## 2020-08-15 DIAGNOSIS — Z880 Allergy status to penicillin: Secondary | ICD-10-CM | POA: Insufficient documentation

## 2020-08-15 DIAGNOSIS — I2510 Atherosclerotic heart disease of native coronary artery without angina pectoris: Secondary | ICD-10-CM | POA: Diagnosis not present

## 2020-08-15 DIAGNOSIS — Z7902 Long term (current) use of antithrombotics/antiplatelets: Secondary | ICD-10-CM | POA: Diagnosis not present

## 2020-08-15 DIAGNOSIS — M069 Rheumatoid arthritis, unspecified: Secondary | ICD-10-CM | POA: Diagnosis not present

## 2020-08-15 DIAGNOSIS — R590 Localized enlarged lymph nodes: Secondary | ICD-10-CM | POA: Diagnosis not present

## 2020-08-15 DIAGNOSIS — C774 Secondary and unspecified malignant neoplasm of inguinal and lower limb lymph nodes: Secondary | ICD-10-CM | POA: Diagnosis not present

## 2020-08-15 DIAGNOSIS — Z86718 Personal history of other venous thrombosis and embolism: Secondary | ICD-10-CM | POA: Insufficient documentation

## 2020-08-15 DIAGNOSIS — R2242 Localized swelling, mass and lump, left lower limb: Secondary | ICD-10-CM | POA: Diagnosis not present

## 2020-08-15 DIAGNOSIS — Z881 Allergy status to other antibiotic agents status: Secondary | ICD-10-CM | POA: Diagnosis not present

## 2020-08-15 DIAGNOSIS — Z86711 Personal history of pulmonary embolism: Secondary | ICD-10-CM | POA: Diagnosis not present

## 2020-08-15 DIAGNOSIS — I517 Cardiomegaly: Secondary | ICD-10-CM | POA: Diagnosis not present

## 2020-08-15 DIAGNOSIS — L720 Epidermal cyst: Secondary | ICD-10-CM | POA: Insufficient documentation

## 2020-08-15 DIAGNOSIS — Z79899 Other long term (current) drug therapy: Secondary | ICD-10-CM | POA: Diagnosis not present

## 2020-08-15 DIAGNOSIS — K5730 Diverticulosis of large intestine without perforation or abscess without bleeding: Secondary | ICD-10-CM | POA: Insufficient documentation

## 2020-08-15 DIAGNOSIS — C4372 Malignant melanoma of left lower limb, including hip: Secondary | ICD-10-CM | POA: Insufficient documentation

## 2020-08-15 DIAGNOSIS — Z85820 Personal history of malignant melanoma of skin: Secondary | ICD-10-CM | POA: Insufficient documentation

## 2020-08-15 HISTORY — DX: Localized edema: R60.0

## 2020-08-15 HISTORY — PX: LYMPH NODE BIOPSY: SHX201

## 2020-08-15 HISTORY — DX: Edema, unspecified: R60.9

## 2020-08-15 HISTORY — DX: Polyneuropathy, unspecified: G62.9

## 2020-08-15 LAB — CBC
HCT: 40.9 % (ref 39.0–52.0)
Hemoglobin: 13 g/dL (ref 13.0–17.0)
MCH: 30.3 pg (ref 26.0–34.0)
MCHC: 31.8 g/dL (ref 30.0–36.0)
MCV: 95.3 fL (ref 80.0–100.0)
Platelets: 184 10*3/uL (ref 150–400)
RBC: 4.29 MIL/uL (ref 4.22–5.81)
RDW: 12.8 % (ref 11.5–15.5)
WBC: 9 10*3/uL (ref 4.0–10.5)
nRBC: 0 % (ref 0.0–0.2)

## 2020-08-15 LAB — BASIC METABOLIC PANEL
Anion gap: 11 (ref 5–15)
BUN: 28 mg/dL — ABNORMAL HIGH (ref 8–23)
CO2: 32 mmol/L (ref 22–32)
Calcium: 8.5 mg/dL — ABNORMAL LOW (ref 8.9–10.3)
Chloride: 94 mmol/L — ABNORMAL LOW (ref 98–111)
Creatinine, Ser: 1.24 mg/dL (ref 0.61–1.24)
GFR, Estimated: >60 mL/min (ref >60)
Glucose, Bld: 96 mg/dL (ref 70–99)
Potassium: 3.8 mmol/L (ref 3.5–5.1)
Sodium: 137 mmol/L (ref 135–145)

## 2020-08-15 SURGERY — LYMPH NODE BIOPSY
Anesthesia: General | Site: Leg Upper

## 2020-08-15 MED ORDER — LIDOCAINE 2% (20 MG/ML) 5 ML SYRINGE
INTRAMUSCULAR | Status: DC | PRN
  Administered 2020-08-15: 60 mg via INTRAVENOUS

## 2020-08-15 MED ORDER — ORAL CARE MOUTH RINSE: 15.0000 mL | Freq: Once | OROMUCOSAL | Status: AC

## 2020-08-15 MED ORDER — ACETAMINOPHEN 500 MG PO TABS
1000.0000 mg | ORAL_TABLET | ORAL | Status: AC
  Administered 2020-08-15: 1000 mg via ORAL
  Filled 2020-08-15: qty 2

## 2020-08-15 MED ORDER — BUPIVACAINE-EPINEPHRINE 0.25% -1:200000 IJ SOLN
INTRAMUSCULAR | Status: DC | PRN
  Administered 2020-08-15: 30 mL

## 2020-08-15 MED ORDER — CHLORHEXIDINE GLUCONATE CLOTH 2 % EX PADS: 6.0000 | MEDICATED_PAD | Freq: Once | CUTANEOUS | Status: DC

## 2020-08-15 MED ORDER — FENTANYL CITRATE (PF) 250 MCG/5ML IJ SOLN
INTRAMUSCULAR | Status: DC | PRN
  Administered 2020-08-15 (×2): 25 ug via INTRAVENOUS

## 2020-08-15 MED ORDER — PHENYLEPHRINE HCL-NACL 10-0.9 MG/250ML-% IV SOLN
INTRAVENOUS | Status: DC | PRN
  Administered 2020-08-15: 70 ug/min via INTRAVENOUS

## 2020-08-15 MED ORDER — ONDANSETRON HCL 4 MG/2ML IJ SOLN
INTRAMUSCULAR | Status: DC | PRN
  Administered 2020-08-15: 4 mg via INTRAVENOUS

## 2020-08-15 MED ORDER — FENTANYL CITRATE (PF) 100 MCG/2ML IJ SOLN
INTRAMUSCULAR | Status: AC
  Filled 2020-08-15: qty 2

## 2020-08-15 MED ORDER — DEXAMETHASONE SODIUM PHOSPHATE 10 MG/ML IJ SOLN
INTRAMUSCULAR | Status: DC | PRN
  Administered 2020-08-15: 10 mg via INTRAVENOUS

## 2020-08-15 MED ORDER — CHLORHEXIDINE GLUCONATE 0.12 % MT SOLN
15.0000 mL | Freq: Once | OROMUCOSAL | Status: AC
  Administered 2020-08-15: 15 mL via OROMUCOSAL

## 2020-08-15 MED ORDER — LIDOCAINE-EPINEPHRINE (PF) 1 %-1:200000 IJ SOLN
INTRAMUSCULAR | Status: DC | PRN
  Administered 2020-08-15: 30 mL

## 2020-08-15 MED ORDER — PROPOFOL 10 MG/ML IV BOLUS
INTRAVENOUS | Status: DC | PRN
  Administered 2020-08-15: 110 mg via INTRAVENOUS

## 2020-08-15 MED ORDER — BUPIVACAINE-EPINEPHRINE 0.25% -1:200000 IJ SOLN
INTRAMUSCULAR | Status: AC
  Filled 2020-08-15: qty 1

## 2020-08-15 MED ORDER — CIPROFLOXACIN IN D5W 400 MG/200ML IV SOLN
400.0000 mg | INTRAVENOUS | Status: AC
  Administered 2020-08-15: 400 mg via INTRAVENOUS
  Filled 2020-08-15: qty 200

## 2020-08-15 MED ORDER — PROPOFOL 10 MG/ML IV BOLUS
INTRAVENOUS | Status: AC
  Filled 2020-08-15: qty 20

## 2020-08-15 MED ORDER — LACTATED RINGERS IV SOLN: INTRAVENOUS | Status: DC

## 2020-08-15 MED ORDER — HYDROMORPHONE HCL 2 MG PO TABS: 2.0000 mg | ORAL_TABLET | Freq: Four times a day (QID) | ORAL | 0 refills | Status: AC | PRN

## 2020-08-15 MED ORDER — 0.9 % SODIUM CHLORIDE (POUR BTL) OPTIME
TOPICAL | Status: DC | PRN
  Administered 2020-08-15: 1000 mL

## 2020-08-15 MED ORDER — LIDOCAINE-EPINEPHRINE (PF) 1 %-1:200000 IJ SOLN
INTRAMUSCULAR | Status: AC
  Filled 2020-08-15: qty 30

## 2020-08-15 MED ORDER — GLYCOPYRROLATE PF 0.2 MG/ML IJ SOSY
PREFILLED_SYRINGE | INTRAMUSCULAR | Status: DC | PRN
  Administered 2020-08-15 (×2): .1 mg via INTRAVENOUS

## 2020-08-15 SURGICAL SUPPLY — 46 items
ADH SKN CLS APL DERMABOND .7 (GAUZE/BANDAGES/DRESSINGS)
APPLIER CLIP 11 MED OPEN (CLIP)
APR CLP MED 11 20 MLT OPN (CLIP)
BLADE SURG 15 STRL LF DISP TIS (BLADE) ×1 IMPLANT
BLADE SURG 15 STRL SS (BLADE) ×4
BNDG ELASTIC 6X5.8 VLCR STR LF (GAUZE/BANDAGES/DRESSINGS) IMPLANT
CLIP APPLIE 11 MED OPEN (CLIP) IMPLANT
CLIP VESOCCLUDE MED 6/CT (CLIP) ×2 IMPLANT
CLSR STERI-STRIP ANTIMIC 1/2X4 (GAUZE/BANDAGES/DRESSINGS) ×3 IMPLANT
COVER SURGICAL LIGHT HANDLE (MISCELLANEOUS) ×2 IMPLANT
COVER WAND RF STERILE (DRAPES) IMPLANT
DECANTER SPIKE VIAL GLASS SM (MISCELLANEOUS) ×2 IMPLANT
DERMABOND ADVANCED (GAUZE/BANDAGES/DRESSINGS)
DERMABOND ADVANCED .7 DNX12 (GAUZE/BANDAGES/DRESSINGS) IMPLANT
DRSG TEGADERM 4X4.75 (GAUZE/BANDAGES/DRESSINGS) ×6 IMPLANT
DRSG TELFA 3X8 NADH (GAUZE/BANDAGES/DRESSINGS) ×6 IMPLANT
ELECT COATED BLADE 2.86 ST (ELECTRODE) IMPLANT
ELECT REM PT RETURN 15FT ADLT (MISCELLANEOUS) ×2 IMPLANT
GAUZE 4X4 16PLY RFD (DISPOSABLE) IMPLANT
GAUZE SPONGE 4X4 12PLY STRL (GAUZE/BANDAGES/DRESSINGS) ×2 IMPLANT
GLOVE SURG ENC MOIS LTX SZ6 (GLOVE) ×4 IMPLANT
GLOVE SURG UNDER LTX SZ6.5 (GLOVE) ×4 IMPLANT
GLOVE SURG UNDER POLY LF SZ6.5 (GLOVE) ×2 IMPLANT
GLOVE SURG UNDER POLY LF SZ7 (GLOVE) ×2 IMPLANT
GOWN STRL REUS W/ TWL XL LVL3 (GOWN DISPOSABLE) ×3 IMPLANT
GOWN STRL REUS W/TWL 2XL LVL3 (GOWN DISPOSABLE) ×2 IMPLANT
GOWN STRL REUS W/TWL XL LVL3 (GOWN DISPOSABLE) ×6
KIT BASIN OR (CUSTOM PROCEDURE TRAY) ×2 IMPLANT
KIT TURNOVER KIT A (KITS) ×2 IMPLANT
MARKER SKIN DUAL TIP RULER LAB (MISCELLANEOUS) ×2 IMPLANT
NDL HYPO 25X1 1.5 SAFETY (NEEDLE) ×1 IMPLANT
NEEDLE HYPO 22GX1.5 SAFETY (NEEDLE) IMPLANT
NEEDLE HYPO 25X1 1.5 SAFETY (NEEDLE) ×2 IMPLANT
PACK UNIVERSAL I (CUSTOM PROCEDURE TRAY) ×2 IMPLANT
PAD DRESSING TELFA 3X8 NADH (GAUZE/BANDAGES/DRESSINGS) IMPLANT
PENCIL SMOKE EVACUATOR (MISCELLANEOUS) IMPLANT
SPONGE LAP 18X18 RF (DISPOSABLE) ×2 IMPLANT
SPONGE LAP 4X18 RFD (DISPOSABLE) IMPLANT
STRIP CLOSURE SKIN 1/2X4 (GAUZE/BANDAGES/DRESSINGS) IMPLANT
SUT MNCRL AB 4-0 PS2 18 (SUTURE) ×1 IMPLANT
SUT VIC AB 3-0 SH 27 (SUTURE) ×4
SUT VIC AB 3-0 SH 27X BRD (SUTURE) IMPLANT
SUT VIC AB 3-0 SH 27XBRD (SUTURE) IMPLANT
SYR BULB IRRIG 60ML STRL (SYRINGE) ×2 IMPLANT
SYR CONTROL 10ML LL (SYRINGE) ×2 IMPLANT
TOWEL OR 17X26 10 PK STRL BLUE (TOWEL DISPOSABLE) ×2 IMPLANT

## 2020-08-15 NOTE — Op Note (Signed)
Pre op diagnosis:  Firm left inguinal lymph node and left thigh nodule, h/o LLE melanoma, hot on PET scan; long term abdominal wall nodule that is enlarging.  Post op diagnosis:  Same  Procedure performed:  Excision of left thigh nodule 2 x 1 cm, subcutaneous, excision of deep left inguinal lymph node, excision of abdominal wall nodule 2x2 cm  Surgeon:  Stark Klein, MD  ASSISTANT: Dolphus Jenny, MD, PGY6  Anesthesia:  General and local  EBL:  Minimal  Specimen:  L thigh nodule, left inguinal lymph node, abdominal wall nodule  Procedure:   Patient was identified in the holding area and taken to the operating room and placed supine on the operating room table.  General anesthesia was induced with LMA.  The patient's left groin, upper thigh, and abdominal wall were clipped, prepped and draped in sterile fashion.  Time out was performed according to the surgical safety check list.  When all was correct, we continued.    A line was marked for the incision in the L thigh.  This was infiltrated with local anesthetic.  The skin was incised with a #15 blade in an ellipse to incorporate the skin over the nodule as it was very close to the nodule.  The subcutaneous tissues were divided with the cautery.  A Wietlaner retractor was used to assist with visualization.   The nodule was dissected away from the other tissue with the cautery. Hemostasis was ensured.  The cavity was irrigated.    The left groin was then addressed.  A very large lymph node was immediately apparent.  The prior incision was opened with a #15 blade. The subcutaneous tissues were divided with cautery.  The node was encountered and felt like multiple matted nodes.  This was elevated with an Allis clamp.  Hemaclips were used to ligate the lymphovascular channels entering the node from all sides and the cautery was used to divide the fatty tissues.  Once this was complete, the node was passed off.  The node went all the way down to the  muscle and was fixed.  The cavity was irrigated copiously.  Hemostasis was achieved with the cautery.  The cavity was irrigated.    The abdominal wall nodule was then addressed.  An ellipse was made with a clean #15 blade.  This was excised whole and was dissected with the cautery.  Hemostasis was achieved.  Additional local anesthetic was infiltrated into all the incisions.    The wounds were closed with interrupted 3-0 Vicryl sutures and 4-0 Monocryl running subcuticular suture.  Soft sterile dressings were placed.    The patient was awakened from anesthesia and taken to the PACU in stable condition.  Needle, sponge, and instrument counts were correct.

## 2020-08-15 NOTE — Anesthesia Postprocedure Evaluation (Signed)
Anesthesia Post Note  Patient: [name]  Procedure(s) Performed: EXCISIONAL LYMPH NODE BIOPSY LEFT INGUINAL REGION, EXCISION OF LEFT THIGH NODULE, EXCISION OF LEFT ABDOMINAL WALL LESION (N/A Leg Upper)     Patient location during evaluation: PACU Anesthesia Type: General Level of consciousness: sedated and patient cooperative Pain management: pain level controlled Vital Signs Assessment: post-procedure vital signs reviewed and stable Respiratory status: spontaneous breathing Cardiovascular status: stable Anesthetic complications: no   No complications documented.  Last Vitals:  Vitals:   08/15/20 0930 08/15/20 0945  BP: (!) 160/84 (!) 152/95  Pulse: 82 74  Resp: 20 16  Temp:    SpO2: 94% 99%    Last Pain:  Vitals:   08/15/20 0945  TempSrc:   PainSc: 0-No pain                 Nolon Nations

## 2020-08-15 NOTE — Progress Notes (Signed)
Dr.Houzer came seen patient reported ok send patient to phase 2 patient was a COPD er he lived in the 86-94% o2 sat range he would dip then recover easily so to send him to phase 2 as patient requesting to go home. He was given incentive spirometer which did not really improve his o2 sats Dr.Houzer aware. Patient taken to phase 2 report given to Rn this area aware of this.

## 2020-08-15 NOTE — Interval H&P Note (Signed)
History and Physical Interval Note:  08/15/2020 7:25 AM  [name]  has presented today for surgery, with the diagnosis of RECURRENT MELANOMA.  The various methods of treatment have been discussed with the patient and family. After consideration of risks, benefits and other options for treatment, the patient has consented to  Procedure(s): EXCISIONAL LYMPH NODE BIOPSY LEFT INGUINAL REGION, EXCISION OF LEFT THIGH NODULE, EXCISION OF LEFT ABDOMINAL WALL LESION (N/A) as a surgical intervention.  The patient's history has been reviewed, patient examined, no change in status, stable for surgery.  I have reviewed the patient's chart and labs.  Questions were answered to the patient's satisfaction.     Stark Klein

## 2020-08-15 NOTE — Anesthesia Procedure Notes (Signed)
Procedure Name: LMA Insertion Performed by: Cleda Daub, CRNA Pre-anesthesia Checklist: Patient identified, Emergency Drugs available, Suction available and Patient being monitored Patient Re-evaluated:Patient Re-evaluated prior to induction Oxygen Delivery Method: Circle system utilized Preoxygenation: Pre-oxygenation with 100% oxygen Induction Type: IV induction LMA: LMA inserted LMA Size: 5.0 Number of attempts: 1 Airway Equipment and Method: Oral airway Placement Confirmation: positive ETCO2 and breath sounds checked- equal and bilateral Tube secured with: Tape Dental Injury: Teeth and Oropharynx as per pre-operative assessment

## 2020-08-15 NOTE — Transfer of Care (Signed)
Immediate Anesthesia Transfer of Care Note  Patient: [name]  Procedure(s) Performed: EXCISIONAL LYMPH NODE BIOPSY LEFT INGUINAL REGION, EXCISION OF LEFT THIGH NODULE, EXCISION OF LEFT ABDOMINAL WALL LESION (N/A Leg Upper)  Patient Location: PACU  Anesthesia Type:General  Level of Consciousness: awake, alert , oriented and patient cooperative  Airway & Oxygen Therapy: Patient Spontanous Breathing and Patient connected to face mask oxygen  Post-op Assessment: Report given to RN and Post -op Vital signs reviewed and stable  Post vital signs: Reviewed and stable  Last Vitals:  Vitals Value Taken Time  BP 176/84 08/15/20 0927  Temp    Pulse 82 08/15/20 0929  Resp 20 08/15/20 0929  SpO2 87 % 08/15/20 0929  Vitals shown include unvalidated device data.  Last Pain:  Vitals:   08/15/20 0613  TempSrc:   PainSc: 4       Patients Stated Pain Goal: 3 (00/86/76 1950)  Complications: No complications documented.

## 2020-08-15 NOTE — H&P (Signed)
[name] Appointment: 08/08/2020 8:30 AM Location: [facility] Patient #: [id number] DOB: Nov 09, 1945 Divorced / Language: [name] / Race: White Male   History of Present Illness [name]; 08/08/2020 9:19 AM) The patient is a 75 year old male who presents for a Follow-up for Malignant melanoma.Pt is a 75 yo M referred by Dr. 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 The patient had a mole there for around a year and a half, but it started to grow rapidly and get "ugly" during the fall. He denied bleeding or itching of the lesion. He had not had melanoma before. He denied other new lesions of concern. He is followed by Dr. Fontaine No regularly. His mother had melanoma and pancreatic cancer. 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 No satellitosis, 3-4 mitoses per high power field. brisk TIL, no regression.  He had been followed by Dr. Marin Olp for iron deficiency anemia and was getting set up for colonoscopy. He underwent WLE with advancement flap closure 1-2 cm margins (at knee joint) with SLN bx 02/2018. 1/3 sentinel nodes was positive. He had PET which was negative for metastatic dx. He has had recurrent seroma that was drained multiple times and required a seroma catheter. His seroma resolved. He had several doses of nivolumab, but developed very bad diarrhea and did not finish the course.   He has been seeing Dr. Marin Olp and dermatology. He found a left groin nodule around a month ago (march/april 2022) and brought it to attention of Dr. Marin Olp. PET was positive for the groin nodule and a thigh nodule. Incidentally he was hospitalized in february and was found to have a PE and bilateral DVTs. He was placed on eliquis. follow up imaging showed resolution of clots. Dr. Marin Olp is requesting lymph node biopsy for repeat BRAF testing and other molecular analysis  to see about other possible treatments. His rheumatoid arthritis has also gotten worse. He reports a bothersome "skin cyst" on his abdomen that has recurrently gotten enlarged.   PET 07/31/20 IMPRESSION: 1. Enlarged hypermetabolic left inguinal lymph node and separate small hypermetabolic superficial subcutaneous nodule in the medial left thigh, both new since 07/14/2018 PET-CT and compatible with recurrent metastatic disease. 2. No additional sites of hypermetabolic metastatic disease. 3. Right middle lobe 1.0 cm ground-glass pulmonary nodule is stable since 05/26/2020 chest CT angiogram. Continued chest CT surveillance warranted. 4. Subcapsular 5.0 x 3.0 cm low-attenuation collection along the posterior lower left kidney, new. Consider dedicated renal protocol MRI or CT abdomen without and with IV contrast to exclude underlying lesion, if clinically warranted. 5. Chronic findings include: Aortic Atherosclerosis (ICD10-I70.0). Borderline mild cardiomegaly. Coronary atherosclerosis. Dilated main pulmonary artery suggesting pulmonary arterial hypertension. Marked colonic diverticulosis.  Pathology 02/12/2017 Diagnosis 1. Skin , Left Melanoma EXCISION, NO RESIDUAL MELANOMA 2. Lymph node, sentinel, biopsy, Left Groin #1 ZERO OUT OF ONE SENTINEL LYMPH NODE, NEGATIVE FOR MELANOMA 3. Lymph node, sentinel, biopsy, Left Groin #2 ZERO OUT OF ONE SENTINEL LYMPH NODE, NEGATIVE FOR MELANOMA 4. Lymph node, sentinel, biopsy, Left Groin #3 ONE OUT OF ONE SENTINEL LYMPH NODE, POSITIVE FOR MELANOMA, SEE DESCRIPTION   Allergies (Amber Pitkin, CNA; 08/08/2020 8:53 AM) Penicillins  Morphine Derivatives  Cephalosporins  Allergies Reconciled   Medication History [employer], CNA; 08/08/2020 8:53 AM) Marinol (5MG Capsule, 1 (one) Oral three times daily, as needed, Taken starting 05/02/2017) Active. Ondansetron HCl (8MG Tablet, Oral) Active. TraZODone HCl (100MG  Tablet, Oral)  Active. Prochlorperazine Maleate (10MG Tablet, Oral) Active. Vitamin D3 (2000UNIT Capsule, Oral) Active. Flonase (50MCG/ACT Suspension, Nasal) Active. Melatonin (5MG Tablet, Oral) Active. Fish Oil (1000MG Capsule, Oral) Active. Zofran (4MG Tablet, Oral) Active. Cialis (5MG Tablet, Oral) Active. Vitamin B-12 (1000MCG Tablet, Oral) Active. ALPRAZolam (0.5MG Tablet, Oral) Active. Alfuzosin HCl ER (10MG Tablet ER 24HR, Oral) Active. Clopidogrel Bisulfate (75MG Tablet, Oral) Active. Colcrys (0.6MG Tablet, Oral) Active. FLUoxetine HCl (40MG Capsule, Oral) Active. Fluticasone Propionate (50MCG/ACT Suspension, Nasal) Active. Gabapentin (300MG Capsule, Oral) Active. Losartan Potassium-HCTZ (50-12.5MG Tablet, Oral) Active. Metoprolol Tartrate (25MG Tablet, Oral) Active. Pantoprazole Sodium (40MG Tablet DR, Oral) Active. PredniSONE (5MG Tablet, Oral) Active. Pravastatin Sodium (10MG Tablet, Oral) Active. Medications Reconciled    Review of Systems [name]; 08/08/2020 9:14 AM) All other systems negative  Vitals Engineer, production CNA; 08/08/2020 8:53 AM) 08/08/2020 8:53 AM Weight: 146.6 lb Height: 66in Body Surface Area: 1.75 m Body Mass Index: 23.66 kg/m  Temp.: 98.49F  Pulse: 66 (Regular)  P.OX: 94% (Room air) BP: 134/62(Sitting, Left Arm, Standard)       Physical Exam [name]; 08/08/2020 9:16 AM) General Mental Status-Alert. General Appearance-Consistent with stated age. Hydration-Well hydrated. Voice-Normal.  Integumentary Note: well healed scars. palpable 1-2 cm nodule in the subcutaneous region anteromedially in the thigh around 1/3 of the way from groin crease to knee.   Head and Neck Head-normocephalic, atraumatic with no lesions or palpable masses.  Eye Sclera/Conjunctiva - Bilateral-No scleral icterus.  Chest and Lung Exam Chest and lung exam reveals -quiet, even and easy respiratory effort with no use of  accessory muscles. Inspection Chest Wall - Normal. Back - normal.  Breast - Did not examine.  Cardiovascular Cardiovascular examination reveals -normal pedal pulses bilaterally. Note: regular rate and rhythm  Abdomen Inspection-Inspection Normal. Palpation/Percussion Palpation and Percussion of the abdomen reveal - Soft, Non Tender, No Rebound tenderness, No Rigidity (guarding) and No hepatosplenomegaly.  Peripheral Vascular Upper Extremity Inspection - Bilateral - Normal - No Clubbing, No Cyanosis, No Edema, Pulses Intact. Lower Extremity Palpation - Edema - Bilateral - No edema - Bilateral.  Neurologic Neurologic evaluation reveals -alert and oriented x 3 with no impairment of recent or remote memory. Mental Status-Normal.  Musculoskeletal Global Assessment -Note: lateral deviation of MCP joints bilateral hands and swelling of hand/wrists joints.  Normal Exam - Left-Upper Extremity Strength Normal and Lower Extremity Strength Normal. Normal Exam - Right-Upper Extremity Strength Normal and Lower Extremity Strength Normal.  Lymphatic Head & Neck  General Head & Neck Lymphatics: Bilateral - Description - Normal. Axillary  General Axillary Region: Bilateral - Description - Normal. Tenderness - Non Tender. Note: large palpable node in the left groin below the groin crease below the well healed groin incision. firm.     Assessment & Plan [name]; 08/08/2020 9:18 AM) MALIGNANT MELANOMA METASTATIC TO LYMPH NODE (C43.9) Impression: Recurrent melanoma likely in this node.  will plan to excise at least one node along with the in transit metastasis. No evidence of widespread metastatic disease. Discussed risks of bleeding, seroma, heart/lung issues, recurrent melanoma, and more. He may have issues healing due to prednisone. Current Plans You are being scheduled for surgery- Our schedulers will call you.  You should hear from our office's scheduling  department within 5 working days about the location, date, and time of surgery. We try to make accommodations for patient's preferences in scheduling surgery, but sometimes the OR schedule or the surgeon's schedule prevents [place] from making those accommodations.  If you have not heard from our office [phone]) in 5 working days, call the office and ask for your surgeon's nurse.  If you have other questions about your diagnosis, plan, or surgery, call the office and ask for your surgeon's nurse.  Pt Education - [facility] General Post-op HCI IN-TRANSIT METASTASIS FROM MALIGNANT MELANOMA OF SKIN (C77.9) Impression: See above. SUBCUTANEOUS NODULE OF ABDOMINAL WALL (R22.2) Impression: This appears to be an epidural inclusion cyst. Will excise.    Signed electronically by John Klein, [name] (08/08/2020 9:19 AM)

## 2020-08-15 NOTE — Discharge Instructions (Addendum)
[place] Office Phone Number [phone]   POST OP INSTRUCTIONS  Always review your discharge instruction sheet given to you by the facility where your surgery was performed.  IF YOU HAVE DISABILITY OR FAMILY LEAVE FORMS, YOU MUST BRING THEM TO THE OFFICE FOR PROCESSING.  DO NOT GIVE THEM TO YOUR DOCTOR.  1. A prescription for pain medication may be given to you upon discharge.  Take your pain medication as prescribed, if needed.  If narcotic pain medicine is not needed, then you may take acetaminophen (Tylenol) or ibuprofen (Advil) as needed. 2. Take your usually prescribed medications unless otherwise directed 3. If you need a refill on your pain medication, please contact your pharmacy.  They will contact our office to request authorization.  Prescriptions will not be filled after 5pm or on week-ends. 4. You should eat very light the first 24 hours after surgery, such as soup, crackers, pudding, etc.  Resume your normal diet the day after surgery 5. It is common to experience some constipation if taking pain medication after surgery.  Increasing fluid intake and taking a stool softener will usually help or prevent this problem from occurring.  A mild laxative (Milk of Magnesia or Miralax) should be taken according to package directions if there are no bowel movements after 48 hours. 6. You may shower in 48 hours.  The surgical glue will flake off in 2-3 weeks.   7. ACTIVITIES:  No strenuous activity or heavy lifting for 1 week.   a. You may drive when you no longer are taking prescription pain medication, you can comfortably wear a seatbelt, and you can safely maneuver your car and apply brakes. b. RETURN TO WORK:  __________n/a_______________ Dennis Bast should see your doctor in the office for a follow-up appointment approximately three-four weeks after your surgery.    WHEN TO CALL YOUR DOCTOR: 1. Fever over 101.0 2. Nausea and/or vomiting. 3. Extreme swelling or  bruising. 4. Continued bleeding from incision. 5. Increased pain, redness, or drainage from the incision.  The clinic staff is available to answer your questions during regular business hours.  Please don't hesitate to call and ask to speak to one of the nurses for clinical concerns.  If you have a medical emergency, go to the nearest emergency room or call 911.  A surgeon from [facility] is always on call at the hospital.  For further questions, please visit [email or web address]

## 2020-08-16 ENCOUNTER — Encounter (HOSPITAL_COMMUNITY): Payer: Self-pay | Admitting: General Surgery

## 2020-08-16 LAB — SURGICAL PATHOLOGY

## 2020-08-27 ENCOUNTER — Other Ambulatory Visit: Payer: Self-pay | Admitting: Internal Medicine

## 2020-08-28 ENCOUNTER — Other Ambulatory Visit: Payer: Self-pay | Admitting: Internal Medicine

## 2020-08-30 ENCOUNTER — Encounter: Payer: Self-pay | Admitting: Hematology & Oncology

## 2020-08-30 ENCOUNTER — Inpatient Hospital Stay (HOSPITAL_BASED_OUTPATIENT_CLINIC_OR_DEPARTMENT_OTHER): Payer: Medicare Other | Admitting: Hematology & Oncology

## 2020-08-30 ENCOUNTER — Telehealth: Payer: Self-pay

## 2020-08-30 ENCOUNTER — Other Ambulatory Visit: Payer: Self-pay

## 2020-08-30 ENCOUNTER — Inpatient Hospital Stay: Payer: Medicare Other

## 2020-08-30 ENCOUNTER — Inpatient Hospital Stay: Payer: Medicare Other | Attending: Hematology & Oncology

## 2020-08-30 VITALS — BP 145/80 | HR 66 | Temp 99.6°F | Resp 20 | Wt 147.1 lb

## 2020-08-30 DIAGNOSIS — Z79899 Other long term (current) drug therapy: Secondary | ICD-10-CM | POA: Insufficient documentation

## 2020-08-30 DIAGNOSIS — C4372 Malignant melanoma of left lower limb, including hip: Secondary | ICD-10-CM

## 2020-08-30 DIAGNOSIS — Z86718 Personal history of other venous thrombosis and embolism: Secondary | ICD-10-CM | POA: Insufficient documentation

## 2020-08-30 DIAGNOSIS — Z86711 Personal history of pulmonary embolism: Secondary | ICD-10-CM | POA: Diagnosis not present

## 2020-08-30 DIAGNOSIS — Z7901 Long term (current) use of anticoagulants: Secondary | ICD-10-CM | POA: Diagnosis not present

## 2020-08-30 DIAGNOSIS — D509 Iron deficiency anemia, unspecified: Secondary | ICD-10-CM | POA: Insufficient documentation

## 2020-08-30 DIAGNOSIS — Z95828 Presence of other vascular implants and grafts: Secondary | ICD-10-CM

## 2020-08-30 LAB — CMP (CANCER CENTER ONLY)
ALT: 17 U/L (ref 0–44)
AST: 13 U/L — ABNORMAL LOW (ref 15–41)
Albumin: 3.8 g/dL (ref 3.5–5.0)
Alkaline Phosphatase: 42 U/L (ref 38–126)
Anion gap: 5 (ref 5–15)
BUN: 19 mg/dL (ref 8–23)
CO2: 34 mmol/L — ABNORMAL HIGH (ref 22–32)
Calcium: 9.2 mg/dL (ref 8.9–10.3)
Chloride: 96 mmol/L — ABNORMAL LOW (ref 98–111)
Creatinine: 1.15 mg/dL (ref 0.61–1.24)
GFR, Estimated: >60 mL/min (ref >60)
Glucose, Bld: 103 mg/dL — ABNORMAL HIGH (ref 70–99)
Potassium: 4 mmol/L (ref 3.5–5.1)
Sodium: 135 mmol/L (ref 135–145)
Total Bilirubin: 0.6 mg/dL (ref 0.3–1.2)
Total Protein: 5.9 g/dL — ABNORMAL LOW (ref 6.5–8.1)

## 2020-08-30 LAB — CBC WITH DIFFERENTIAL (CANCER CENTER ONLY)
Abs Immature Granulocytes: 0.04 10*3/uL (ref 0.00–0.07)
Basophils Absolute: 0 10*3/uL (ref 0.0–0.1)
Basophils Relative: 0 %
Eosinophils Absolute: 0 10*3/uL (ref 0.0–0.5)
Eosinophils Relative: 0 %
HCT: 40.8 % (ref 39.0–52.0)
Hemoglobin: 13 g/dL (ref 13.0–17.0)
Immature Granulocytes: 0 %
Lymphocytes Relative: 10 %
Lymphs Abs: 1.1 10*3/uL (ref 0.7–4.0)
MCH: 29.7 pg (ref 26.0–34.0)
MCHC: 31.9 g/dL (ref 30.0–36.0)
MCV: 93.4 fL (ref 80.0–100.0)
Monocytes Absolute: 0.4 10*3/uL (ref 0.1–1.0)
Monocytes Relative: 3 %
Neutro Abs: 9 10*3/uL — ABNORMAL HIGH (ref 1.7–7.7)
Neutrophils Relative %: 87 %
Platelet Count: 179 10*3/uL (ref 150–400)
RBC: 4.37 MIL/uL (ref 4.22–5.81)
RDW: 13 % (ref 11.5–15.5)
WBC Count: 10.5 10*3/uL (ref 4.0–10.5)
nRBC: 0 % (ref 0.0–0.2)

## 2020-08-30 LAB — RETICULOCYTES
Immature Retic Fract: 11.5 % (ref 2.3–15.9)
RBC.: 4.38 MIL/uL (ref 4.22–5.81)
Retic Count, Absolute: 74.9 10*3/uL (ref 19.0–186.0)
Retic Ct Pct: 1.7 % (ref 0.4–3.1)

## 2020-08-30 MED ORDER — SODIUM CHLORIDE 0.9% FLUSH
10.0000 mL | Freq: Once | INTRAVENOUS | Status: AC
Start: 2020-08-30 — End: 2020-08-30
  Administered 2020-08-30: 10 mL via INTRAVENOUS
  Filled 2020-08-30: qty 10

## 2020-08-30 MED ORDER — HEPARIN SOD (PORK) LOCK FLUSH 100 UNIT/ML IV SOLN
500.0000 [IU] | Freq: Once | INTRAVENOUS | Status: AC
  Administered 2020-08-30: 500 [IU] via INTRAVENOUS
  Filled 2020-08-30: qty 5

## 2020-08-30 NOTE — Progress Notes (Signed)
Hematology and Oncology Follow Up Visit  [name] [id number] January 03, 1946 75 y.o. 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  He does have recurrent melanoma.  He did have a lymph node biopsy done by Dr. Barry Dienes.  This was done on 08/15/2020.  The pathology report ([id number]) showed that he had metastatic melanoma.  He had lymphoma and a left thigh nodule and a lymphoma and a left inguinal lymph node.  I just wonder if this might not be some type of in-transit potassium systems is initial melanoma was behind the left knee.  For right now, the problem that he is having is that he he was developing fluid where he had the lymph node resected.  I know he sees Dr. Barry Dienes for this.  I would like to think that this is going to resolve on its own.    I think that he probably would benefit from radiation therapy to the inguinal area.  I spoke with Dr. Sondra Come of Radiation Oncology and he will see [name].  I do think that he probably is going to need some kind of systemic therapy.  Unfortunately, he really had a tough time with Opdivo when we initially treated him.  He had horrible diarrhea.  I am a little bit reluctant to utilize Curt Bears is because of the high risk of diarrhea.  He is doing well on the Eliquis.  He has had no problems with bleeding.  There is no change in bowel or bladder  habits.  He has had no cough or shortness of breath.  He has had no nausea or vomiting.  Overall, I would say his performance status is ECOG 1.    Medications:  Allergies as of 08/30/2020      Reactions   Cefepime Hives, Shortness Of Breath   Cephaeline Hives   Morphine Swelling   A swollen stomach.   Morphine And Related Shortness Of Breath, Nausea And Vomiting, Swelling, Other (See Comments)   Agitation, tolerates dilaudid Other reaction(s): Other (See Comments) Agitation, tolerates dilaudid   Penicillins Hives, Shortness Of Breath, Rash   Has patient had a PCN reaction causing immediate rash, facial/tongue/throat swelling, SOB or lightheadedness with hypotension: Yes Has patient had a PCN reaction causing severe rash involving mucus membranes or skin necrosis: Yes Has patient had a PCN reaction that required hospitalization No Has patient had a PCN reaction occurring within the last 10 years: No If all of the above answers are "NO", then may proceed with Cephalosporin use.   Doxycycline Rash   Oxycodone-acetaminophen Other (See Comments)   Patient doesn't remember what type of reaction.      Medication List       Accurate as of Aug 30, 2020  1:26 PM. If you have any questions, ask your nurse or doctor.        albuterol 108 (90 Base) MCG/ACT inhaler Commonly known as: VENTOLIN HFA Inhale 1-2  puffs into the lungs every 4 (four) hours as needed for wheezing or shortness of breath.   ALPRAZolam 0.5 MG tablet Commonly known as: XANAX Take 1 tablet (0.5 mg total) by mouth 3 (three) times daily as needed for anxiety. for anxiety   apixaban 5 MG Tabs tablet Commonly known as: ELIQUIS Take 1 tablet (5 mg total) by mouth 2 (two) times daily.   B-6 PO Take 1 tablet by mouth daily.   FLUoxetine 40 MG capsule Commonly known as: PROZAC Take 40 mg by mouth 2 (two) times daily.   FLUoxetine HCl 60 MG Tabs 1 tab po qd   fluticasone 50 MCG/ACT nasal spray Commonly known as:  FLONASE INSTILL 2 SPRAYS IN EACH NOSTRIL EVERY DAY What changed:   how much to take  how to take this  when to take this  reasons to take this  additional instructions   gabapentin 300 MG capsule Commonly known as: NEURONTIN Take 2 capsules (600 mg total) by mouth at bedtime.   HYDROmorphone 2 MG tablet Commonly known as: DILAUDID Take 1-1.5 tablets (2-3 mg total) by mouth every 8 (eight) hours as needed for severe pain.   lidocaine-prilocaine cream Commonly known as: EMLA Apply 1 application topically as needed. What changed: reasons to take this   Melatonin 10 MG Tabs Take 10 mg by mouth at bedtime.   metoprolol tartrate 25 MG tablet Commonly known as: LOPRESSOR TAKE 1 TABLET (25 MG TOTAL) BY MOUTH 2 (TWO) TIMES DAILY.   pantoprazole 40 MG tablet Commonly known as: PROTONIX Take 1 tablet (40 mg total) by mouth every evening.   polyvinyl alcohol 1.4 % ophthalmic solution Commonly known as: LIQUIFILM TEARS Place 1 drop into both eyes as needed for dry eyes.   torsemide 100 MG tablet Commonly known as: DEMADEX Take 0.5-1 tablets (50-100 mg total) by mouth daily as needed. What changed: reasons to take this   traZODone 100 MG tablet Commonly known as: DESYREL Take 1.5 tablets (150 mg total) by mouth at bedtime. TAKE 1 TABLET AT BEDTIME What changed: additional instructions   vitamin B-12 1000 MCG tablet Commonly known as: CYANOCOBALAMIN Take 1,000 mcg by mouth daily.   Vitamin D3 50 MCG (2000 UT) capsule Take 1 capsule (2,000 Units total) by mouth daily.       Allergies:  Allergies  Allergen Reactions  . Cefepime Hives and Shortness Of Breath  . Cephaeline Hives  . Morphine Swelling    A swollen stomach.  . Morphine And Related Shortness Of Breath, Nausea And Vomiting, Swelling and Other (See Comments)    Agitation, tolerates dilaudid Other reaction(s): Other (See Comments) Agitation, tolerates dilaudid  . Penicillins Hives, Shortness Of Breath  and Rash    Has patient had a PCN reaction causing immediate rash, facial/tongue/throat swelling, SOB or lightheadedness with hypotension: Yes Has patient had a PCN reaction causing severe rash involving mucus membranes or skin necrosis: Yes Has patient had a PCN reaction that required hospitalization No Has patient had a PCN reaction occurring within the last 10 years: No If all of the above answers are "NO", then may proceed with Cephalosporin use.   [name] Doxycycline Rash  . Oxycodone-Acetaminophen Other (See Comments)    Patient doesn't remember what type of reaction.    Past Medical History, Surgical history, Social history, and Family History were reviewed and updated.  Review of Systems: Review of Systems  Constitutional: Positive for malaise/fatigue.  HENT: Negative.   Eyes: Negative.   Respiratory: Positive for  shortness of breath.   Cardiovascular: Positive for palpitations.  Gastrointestinal: Positive for nausea.  Genitourinary: Negative.   Musculoskeletal: Positive for joint pain and neck pain.  Skin: Negative.   Neurological: Positive for dizziness.  Endo/Heme/Allergies: Bruises/bleeds easily.  Psychiatric/Behavioral: Negative.       Physical Exam:  vitals were not taken for this visit.   Wt Readings from Last 3 Encounters:  08/15/20 147 lb 14.9 oz (67.1 kg)  08/08/20 145 lb 9.6 oz (66 kg)  08/03/20 146 lb 12.8 oz (66.6 kg)    Physical Exam Vitals reviewed.  HENT:     Head: Normocephalic and atraumatic.  Eyes:     Pupils: Pupils are equal, round, and reactive to light.  Cardiovascular:     Rate and Rhythm: Normal rate and regular rhythm.     Heart sounds: Normal heart sounds.  Pulmonary:     Effort: Pulmonary effort is normal.     Breath sounds: Normal breath sounds.  Abdominal:     General: Bowel sounds are normal.     Palpations: Abdomen is soft.     Comments: Abdominal exam does show a palpable lymph node in the left inguinal region.  This is  nontender.  It measures about 2 cm.  It is mobile.  Musculoskeletal:        General: No tenderness or deformity. Normal range of motion.     Cervical back: Normal range of motion.     Comments: In the medial upper left thigh.  There is a small subcutaneous nodule.  It measures about 1 cm.  It is mobile and nontender.  Lymphadenopathy:     Cervical: No cervical adenopathy.  Skin:    General: Skin is warm and dry.     Findings: No erythema or rash.  Neurological:     Mental Status: He is alert and oriented to person, place, and time.  Psychiatric:        Behavior: Behavior normal.        Thought Content: Thought content normal.        Judgment: Judgment normal.    Lab Results  Component Value Date   WBC 10.5 08/30/2020   HGB 13.0 08/30/2020   HCT 40.8 08/30/2020   MCV 93.4 08/30/2020   PLT 179 08/30/2020   Lab Results  Component Value Date   FERRITIN 60 08/03/2020   IRON 69 08/03/2020   TIBC 226 08/03/2020   UIBC 157 08/03/2020   IRONPCTSAT 30 08/03/2020   Lab Results  Component Value Date   RETICCTPCT 1.5 08/03/2020   RBC 4.37 08/30/2020   No results found for: KPAFRELGTCHN, LAMBDASER, KAPLAMBRATIO No results found for: IGGSERUM, IGA, IGMSERUM No results found for: Odetta Pink, SPEI   Chemistry      Component Value Date/Time   NA 137 08/15/2020 0610   NA 140 04/10/2017 1046   NA 135 (L) 03/06/2017 1104   K 3.8 08/15/2020 0610   K 3.7 04/10/2017 1046   K 4.2 03/06/2017 1104   CL 94 (L) 08/15/2020 0610   CL 98 04/10/2017 1046   CO2 32 08/15/2020 0610   CO2 31 04/10/2017 1046   CO2 30 (H) 03/06/2017 1104   BUN 28 (H) 08/15/2020 0610   BUN 21 04/10/2017 1046   BUN 20.0 03/06/2017 1104   CREATININE 1.24 08/15/2020 0610   CREATININE 1.19 08/03/2020 1330   CREATININE 0.9 04/10/2017 1046   CREATININE 1.0 03/06/2017 1104  Component Value Date/Time   CALCIUM 8.5 (L) 08/15/2020 0610   CALCIUM 8.9  04/10/2017 1046   CALCIUM 9.4 03/06/2017 1104   ALKPHOS 44 08/03/2020 1330   ALKPHOS 65 04/10/2017 1046   ALKPHOS 78 03/06/2017 1104   AST 11 (L) 08/03/2020 1330   AST 20 03/06/2017 1104   ALT 11 08/03/2020 1330   ALT 21 04/10/2017 1046   ALT 20 03/06/2017 1104   BILITOT 0.6 08/03/2020 1330   BILITOT 0.47 03/06/2017 1104       Impression and Plan:   Mr. Karner is a very pleasant 75yo caucasian gentleman with history of hemochromatosis,homozygous for the C282Y mutation which has not been a problem for himso far.   He did have melanoma.  This actually was of the left knee.  He did have some sentinel nodes were taken after the left groin.  This was back in 2018.  He now has recurrence in the left inguinal region.  He also has a left thigh nodule that was positive for melanoma.  Again we will see if radiation therapy can help this.  I think he does need systemic therapy.  I just not sure how well he is going to tolerate immunotherapy.  Again he had a horrible time with Opdivo.  I will have to think about how we can try to treat him systemically.  He will probably require a good 3-4 weeks of radiation therapy.  Thankfully, his blood clots have resolved.  I am happy about this at least.  I would like to plan to get her back to see [place] in another month or so.  We will actually probably see him back after July 4.  By then, he should be done with radiation.   [name], MD 5/25/20221:26 PM

## 2020-08-30 NOTE — Telephone Encounter (Signed)
Patient called to see if It was okay for him to get the covid booster, informed patient per MD that it was fine for him to receive this booster. Patient verbalized understanding and denies any other questions or concerns at this time.

## 2020-08-30 NOTE — Patient Instructions (Signed)
Implanted [facility] Guide An implanted port is a device that is placed under the skin. It is usually placed in the chest. The device can be used to give IV medicine, to take blood, or for dialysis. You may have an implanted port if:  You need IV medicine that would be irritating to the small veins in your hands or arms.  You need IV medicines, such as antibiotics, for a long period of time.  You need IV nutrition for a long period of time.  You need dialysis. When you have a port, your health care provider can choose to use the port instead of veins in your arms for these procedures. You may have fewer limitations when using a port than you would if you used other types of long-term IVs, and you will likely be able to return to normal activities after your incision heals. An implanted port has two main parts:  Reservoir. The reservoir is the part where a needle is inserted to give medicines or draw blood. The reservoir is round. After it is placed, it appears as a small, raised area under your skin.  Catheter. The catheter is a thin, flexible tube that connects the reservoir to a vein. Medicine that is inserted into the reservoir goes into the catheter and then into the vein. How is my port accessed? To access your port:  A numbing cream may be placed on the skin over the port site.  Your health care provider will put on a mask and sterile gloves.  The skin over your port will be cleaned carefully with a germ-killing soap and allowed to dry.  Your health care provider will gently pinch the port and insert a needle into it.  Your health care provider will check for a blood return to make sure the port is in the vein and is not clogged.  If your port needs to remain accessed to get medicine continuously (constant infusion), your health care provider will place a clear bandage (dressing) over the needle site. The dressing and needle will need to be changed every week, or as told by your  health care provider. What is flushing? Flushing helps keep the port from getting clogged. Follow instructions from your health care provider about how and when to flush the port. Ports are usually flushed with saline solution or a medicine called heparin. The need for flushing will depend on how the port is used:  If the port is only used from time to time to give medicines or draw blood, the port may need to be flushed: ? Before and after medicines have been given. ? Before and after blood has been drawn. ? As part of routine maintenance. Flushing may be recommended every 4-6 weeks.  If a constant infusion is running, the port may not need to be flushed.  Throw away any syringes in a disposal container that is meant for sharp items (sharps container). You can buy a sharps container from a pharmacy, or you can make one by using an empty hard plastic bottle with a cover. How long will my port stay implanted? The port can stay in for as long as your health care provider thinks it is needed. When it is time for the port to come out, a surgery will be done to remove it. The surgery will be similar to the procedure that was done to put the port in. Follow these instructions at home:  Flush your port as told by your health care  provider.  If you need an infusion over several days, follow instructions from your health care provider about how to take care of your port site. Make sure you: ? Wash your hands with soap and water before you change your dressing. If soap and water are not available, use alcohol-based hand sanitizer. ? Change your dressing as told by your health care provider. ? Place any used dressings or infusion bags into a plastic bag. Throw that bag in the trash. ? Keep the dressing that covers the needle clean and dry. Do not get it wet. ? Do not use scissors or sharp objects near the tube. ? Keep the tube clamped, unless it is being used.  Check your port site every day for signs  of infection. Check for: ? Redness, swelling, or pain. ? Fluid or blood. ? Pus or a bad smell.  Protect the skin around the port site. ? Avoid wearing bra straps that rub or irritate the site. ? Protect the skin around your port from seat belts. Place a soft pad over your chest if needed.  Bathe or shower as told by your health care provider. The site may get wet as long as you are not actively receiving an infusion.  Return to your normal activities as told by your health care provider. Ask your health care provider what activities are safe for you.  Carry a medical alert card or wear a medical alert bracelet at all times. This will let health care providers know that you have an implanted port in case of an emergency.   Get help right away if:  You have redness, swelling, or pain at the port site.  You have fluid or blood coming from your port site.  You have pus or a bad smell coming from the port site.  You have a fever. Summary  Implanted ports are usually placed in the chest for long-term IV access.  Follow instructions from your health care provider about flushing the port and changing bandages (dressings).  Take care of the area around your port by avoiding clothing that puts pressure on the area, and by watching for signs of infection.  Protect the skin around your port from seat belts. Place a soft pad over your chest if needed.  Get help right away if you have a fever or you have redness, swelling, pain, drainage, or a bad smell at the port site. This information is not intended to replace advice given to you by your health care provider. Make sure you discuss any questions you have with your health care provider. Document Revised: 08/09/2019 Document Reviewed: 08/09/2019 Elsevier Patient Education  [employer].

## 2020-08-31 ENCOUNTER — Encounter: Payer: Self-pay | Admitting: Hematology & Oncology

## 2020-08-31 ENCOUNTER — Telehealth: Payer: Self-pay

## 2020-08-31 LAB — FERRITIN: Ferritin: 64 ng/mL (ref 24–336)

## 2020-08-31 LAB — IRON AND TIBC
Iron: 45 ug/dL (ref 42–163)
Saturation Ratios: 20 % (ref 20–55)
TIBC: 220 ug/dL (ref 202–409)
UIBC: 176 ug/dL (ref 117–376)

## 2020-08-31 NOTE — Telephone Encounter (Signed)
No 08/30/20 LOS   [name]

## 2020-09-01 NOTE — Progress Notes (Signed)
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    If Spine Met(s), symptoms, if any, include: Bowel/Bladder retention or incontinence (please describe): has bowel movement every 3-4 days which is patient's baseline. Denies diarrhea or constipation. Denies urinary symptoms. Numbness or weakness in extremities (please describe): reports weakness in all extremities. Current Decadron regimen, if applicable: none  Ambulatory status? Walker? Wheelchair?: uses a walker  SAFETY ISSUES: Prior radiation? no Pacemaker/ICD? no Possible current pregnancy? no Is the patient on methotrexate? no  Additional Complaints / other details:  bilateral ankle swelling, back pain related to spinal fusion, arthritis, didn't tolerate chemotherapy  Vitals:   09/14/20 1301  BP: 113/74  Pulse: 70  Resp: 18  Temp: (!) 97.3 F (36.3 C)  TempSrc: Temporal  SpO2: 96%  Height: 5' 6"  (1.676 m)

## 2020-09-11 DIAGNOSIS — Z1159 Encounter for screening for other viral diseases: Secondary | ICD-10-CM | POA: Diagnosis not present

## 2020-09-11 DIAGNOSIS — Z20828 Contact with and (suspected) exposure to other viral communicable diseases: Secondary | ICD-10-CM | POA: Diagnosis not present

## 2020-09-13 NOTE — Progress Notes (Signed)
Radiation Oncology         [phone] ________________________________  Initial Outpatient Consultation  Name: [name] MRN: [id number]  Date: 09/14/2020  DOB: Apr 05, 1946  EY:CXKGYJEHU, Evie Lacks, MD  [name], MD   REFERRING PHYSICIAN: [name], MD  DIAGNOSIS: The encounter diagnosis was 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[place]). 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 male who is accompanied by no one. he is seen as a courtesy of Dr. Marin Olp for an opinion concerning radiation therapy as part of management for his recently reoccured malignant melanoma of the left thigh. The patient presented to Dr. Marin Olp on 08/03/20 who informed the patient of the recurrent melanoma based off of PET scan taken on 07/31/20; which showed a very active lymph node in the patients left inguinal area that measured 2.4 cm.     Of note:the patient initially completed treatment with Opdivo in October of 2019; with no reoccurrences of melanoma until PET scan taken on 07/31/20.  During his visit with Dr. Marin Olp on 08/03/20, the patient underwent chest CT to evaluate his recent occurrence of pulmonary embolus seen on chest CT from ED visit on 05/27/20 for acute hypoxia . Results from CT taken on 08/03/20 showed some stable bilateral ground-glass pulmonary nodules measuring up to 11 mm. The patient was noted during the same visit to report having developed DVT as well, in which the patient received an ultrasound on the same day showing the malignant appearing left inguinal lymph node (seen on PET scan from 07/31/20). Findings from US showed that there was no evidence of acute or chronic deep vein thrombosis within either lower extremity.    The patient underwent an excisional lymph node biopsy of the left inguinal region on 08/15/20 showing: a malignant melanoma from the left thigh nodule, as well as metastatic  malignant melanoma from the left inguinal lymph node excision. Excision of the soft tissue abdominal wall nodule indicated presence of a benign epidermoid cyst.  Surgical margin was focally positive on the left thigh nodule site  The patient most recently followed up with Dr. Marin Olp on 08/30/20 in which results from biopsy taken on 08/15/20 were discussed with the patient and his wife. It was noted during the visit that the patient developed fluid where he had the lymph node resected, however it will most likely resolve on its own. Dr. Arelia Sneddon conveyed to the patient that he would most likely benefit from radiation therapy.  Continues to have good developed in the operative site and was seen by Dr. Barry Dienes earlier this week.  He had a JP drain placed and is draining ~ 100 cc a day.  (Of note: from visit mentioned to the ED ([place]) on 05/27/2020 in which the patient presented with hypoxia, his O2 was noted to be 93% on room air. CT of chest taken during the visit showed a small nonocclusive subsegmental right upper lobe PE.  Chronic elevation right hemidiaphragm with partial right lower lobe consolidation was also seen which was noted to appear chronic.)  PREVIOUS RADIATION THERAPY: No  PAST MEDICAL HISTORY:  Past Medical History:  Diagnosis Date   Alcoholism /alcohol abuse    per family   Allergic rhinitis    Allergy    Anxiety    Bacterial infection    CAD (coronary artery disease)    minimal coronary plaque in the LAD and right coronary  system. 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 7-06. Last nuclear stress 11-17-06 fixed anterior/ inferior defect, no inducible ischemia, EF 81%   Cataract    CHF (congestive heart failure) (HCC)    Chronic back pain    "all over back"   Chronic neck pain    COPD (chronic obstructive pulmonary disease) ([place])    Depression    Diverticulosis    DVT (deep venous thrombosis)  ([place]) 06/2020   Dyspnea    with exertion   Dysrhythmia 01-24-12   past hx. A.Fib x1 episode-responded to med.   Falls frequently    "since 02/2013" (06/16/2013)   Family history of cancer    Genetic testing 03/06/2017   Multi-Cancer panel (83 genes) @ [facility] - No pathogenic mutations detected   GERD (gastroesophageal reflux disease)    Hemochromatosis    dx'd 14 yrs ago last ferritin Aug 11, 08 52 (22-322), Fe 136 ("I had 250 phlebotomies for that")   High cholesterol    hx   Hx of colonic polyps    Hx of colonoscopy    Hypertension    Iron deficiency anemia due to chronic blood loss 12/27/2016   Malignant melanoma of knee, left (HCC)    Myocardial infarction [facility]) 2006   "related to catheterization"   Narcotic abuse ([place])    per family   Neuropathy    fingers and toes   Osteoarthritis    Peripheral edema    Bilateral ankles   PONV (postoperative nausea and vomiting)    Pulmonary embolism ([place]) 06/2020   RA (rheumatoid arthritis) ([place])     PAST SURGICAL HISTORY: Past Surgical History:  Procedure Laterality Date   ABDOMINAL ADHESION SURGERY  ~ [place] Right 6-09   [facility]   APPENDECTOMY  ~ [place] Left 03/27/2017   Procedure: ASPIRATION OF SEROMA;  Surgeon: Stark Klein, MD;  Location: [place];  Service: General;  Laterality: Left;   BONE TUMOR RESECTION  ~ 1954   "taken off my mastoid"   [place] Right 1990's   CATARACT EXTRACTION, BILATERAL Bilateral 01-24-12   CORONARY ANGIOPLASTY WITH STENT PLACEMENT  2006   "while repairing 1st stent, a second area tore and they had to place 2nd stent " ?LAD & CX   CORONARY ANGIOPLASTY WITH STENT PLACEMENT  06/17/2014   CYST EXCISION  "several OR's"   "backX 2, back of my neck, face, inside right bicept, chest, wrist"   FOOT SURGERY Right 11-08   for removal of bone spurs-   HAMMER TOE SURGERY Right 07/2012   "broke 4 hammertoes"    HARDWARE REMOVAL   03/09/2012   Procedure: HARDWARE REMOVAL;  Surgeon: Nita Sells, MD;  Location: [place];  Service: Orthopedics;  Laterality: Left;  Hardware Removal from Left Shoulder   HARVEST BONE GRAFT  02/06/2012   Procedure: HARVEST ILIAC BONE GRAFT;  Surgeon: Nita Sells, MD;  Location: [facility];  Service: Orthopedics;;  bone marrow aspirqation    INGUINAL HERNIA REPAIR Bilateral    JOINT REPLACEMENT     KNEE SURGERY Left ~ 2003   "6-12 months after uni knee removed synovial sack"   LEFT HEART CATHETERIZATION WITH CORONARY ANGIOGRAM N/A 06/17/2014   PCI of diffuse severe stenosis in the proximal to mid LAD using overlapping drug-eluting stents.   LUMBAR WOUND DEBRIDEMENT N/A 03/17/2013   Procedure: Incision and drainage  of superficial lumbar wound;  Surgeon: Floyce Stakes, MD;  Location: [place] [facility];  Service: Neurosurgery;  Laterality: N/A;  Incision and drainage of superficial lumbar wound   LYMPH NODE BIOPSY N/A 08/15/2020   Procedure: EXCISIONAL LYMPH NODE BIOPSY LEFT INGUINAL REGION, EXCISION OF LEFT THIGH NODULE, EXCISION OF LEFT ABDOMINAL WALL LESION;  Surgeon: Stark Klein, MD;  Location[facility];  Service: General;  Laterality: N/A;   MECKEL DIVERTICULUM EXCISION  ~ [place] LYMPH NODE BIOPSY Left 02/12/2017   WIDE LOCAL EXCISION LEFT KNEE MELANOMA, ADVANCEMENT FLAP CLOSURE, AND SENTINEL LYMPH NODE MAPPING AND BIOPSY.   MELANOMA EXCISION WITH SENTINEL LYMPH NODE BIOPSY Left 02/12/2017   Procedure: WIDE LOCAL EXCISION LEFT KNEE MELANOMA, ADVANCEMENT FLAP CLOSURE, AND SENTINEL LYMPH NODE MAPPING AND BIOPSY.;  Surgeon: Stark Klein, MD;  Location: [place];  Service: General;  Laterality: Left;  GENERAL AND LOCAL   ORIF SHOULDER FRACTURE  02/06/2012   Procedure: OPEN REDUCTION INTERNAL FIXATION (ORIF) SHOULDER FRACTURE;  Surgeon: Nita Sells, MD;  [facility];  Service: Orthopedics;  Laterality: Left;  ORIF of a  Left Shoulder Fracture with  Iliac Crest Bone Graft aspiration    PORTACATH PLACEMENT Left 03/27/2017   Procedure: INSERTION PORT-A-CATH;  Surgeon: Stark Klein, MD;  Location: [place];  Service: General;  Laterality: Left;   POSTERIOR LUMBAR FUSION  12-10   L4-5 diskectomy w/ fusion, cage placement and rods; [place]   POSTERIOR LUMBAR FUSION 4 LEVEL N/A 03/02/2013   Procedure: Lumbar One to Sacral One Posterior lumbar interbody fusion;  Surgeon: Floyce Stakes, MD;  Location: [place] [facility];  Service: Neurosurgery;  Laterality: N/A;  L1 to S1 Posterior lumbar interbody fusion   REPLACEMENT UNICONDYLAR JOINT KNEE Left ~ 2003   "~ 6 months after total knee replaced"   SHOULDER ARTHROSCOPY Left ~ 2004 X 2   "@ [facility]; left bone splinter in & had to clean it out"   TOTAL ANKLE REPLACEMENT Right 2008   at [place] Bilateral 2002   TOTAL SHOULDER REPLACEMENT Left 2006   TOTAL SHOULDER REPLACEMENT Right ~ 2007   Dr. Marlou Sa    FAMILY HISTORY:  Family History  Problem Relation Age of Onset   Uterine cancer Mother        uterine vs. cervix? dx 77s; deceased 33   Macular degeneration Mother    Other Mother        ankle edema   Lung cancer Mother    Melanoma Mother        multiple spots on legs   Cancer - Lung Mother        smoker   Coronary artery disease Father    Hypertension Father    Prostate cancer Father    Colon polyps Father    Cancer - Prostate Father 68       deceased 73   Heart attack Brother    Hyperlipidemia Brother    Other Brother        Schizophrenic   Thyroid disease Sister    Hemochromatosis Sister    Rheum arthritis Sister    Coronary artery disease Maternal Aunt    Heart attack Maternal Aunt    Cancer Maternal Grandmother        unk. type; deceased 31s   Diabetes Neg Hx    Colon cancer Neg Hx    Esophageal cancer Neg Hx    Rectal cancer Neg Hx  Stomach cancer Neg Hx     SOCIAL HISTORY:  Social History    Tobacco Use   Smoking status: Never   Smokeless tobacco: Never  Vaping Use   Vaping Use: Never used  Substance Use Topics   Alcohol use: No    Alcohol/week: 0.0 standard drinks    Comment: "I do not drink anymore"   Drug use: No    ALLERGIES:  Allergies  Allergen Reactions   Cefepime Hives and Shortness Of Breath   Cephaeline Hives   Morphine Swelling    A swollen stomach.   Morphine And Related Shortness Of Breath, Nausea And Vomiting, Swelling and Other (See Comments)    Agitation, tolerates dilaudid Other reaction(s): Other (See Comments) Agitation, tolerates dilaudid   Penicillins Hives, Shortness Of Breath and Rash    Has patient had a PCN reaction causing immediate rash, facial/tongue/throat swelling, SOB or lightheadedness with hypotension: Yes Has patient had a PCN reaction causing severe rash involving mucus membranes or skin necrosis: Yes Has patient had a PCN reaction that required hospitalization No Has patient had a PCN reaction occurring within the last 10 years: No If all of the above answers are "NO", then may proceed with Cephalosporin use.    Doxycycline Rash   Oxycodone-Acetaminophen Other (See Comments)    Patient doesn't remember what type of reaction.    MEDICATIONS:  Current Outpatient Medications  Medication Sig Dispense Refill   albuterol (PROVENTIL HFA;VENTOLIN HFA) 108 (90 Base) MCG/ACT inhaler Inhale 1-2 puffs into the lungs every 4 (four) hours as needed for wheezing or shortness of breath. 1 Inhaler 1   ALPRAZolam (XANAX) 0.5 MG tablet Take 1 tablet (0.5 mg total) by mouth 3 (three) times daily as needed for anxiety. for anxiety 180 tablet 1   apixaban (ELIQUIS) 5 MG TABS tablet Take 5 mg by mouth 2 (two) times daily.     Cholecalciferol (VITAMIN D3) 2000 UNITS capsule Take 1 capsule (2,000 Units total) by mouth daily. 100 capsule 3   FLUoxetine (PROZAC) 40 MG capsule Take 40 mg by mouth 2 (two) times daily.     fluticasone (FLONASE) 50  MCG/ACT nasal spray INSTILL 2 SPRAYS IN EACH NOSTRIL EVERY DAY (Patient taking differently: Place 2 sprays into both nostrils daily as needed for allergies.) 16 g 11   gabapentin (NEURONTIN) 300 MG capsule Take 2 capsules (600 mg total) by mouth at bedtime. 180 capsule 2   HYDROmorphone (DILAUDID) 2 MG tablet Take 1-1.5 tablets (2-3 mg total) by mouth every 8 (eight) hours as needed for severe pain. 90 tablet 0   lidocaine-prilocaine (EMLA) cream Apply 1 application topically as needed. (Patient taking differently: Apply 1 application topically as needed (port access).) 30 g 5   Melatonin 10 MG TABS Take 10 mg by mouth at bedtime.     metoprolol tartrate (LOPRESSOR) 25 MG tablet TAKE 1 TABLET (25 MG TOTAL) BY MOUTH 2 (TWO) TIMES DAILY. (Patient taking differently: Take 25 mg by mouth 2 (two) times daily.) 180 tablet 3   pantoprazole (PROTONIX) 40 MG tablet Take 1 tablet (40 mg total) by mouth every evening. 90 tablet 3   predniSONE (DELTASONE) 5 MG tablet Take 15 mg by mouth daily.     Pyridoxine HCl (B-6 PO) Take 1 tablet by mouth daily.     torsemide (DEMADEX) 100 MG tablet Take 0.5-1 tablets (50-100 mg total) by mouth daily as needed. (Patient taking differently: Take 50-100 mg by mouth daily as needed (fluid).) 90 tablet 1  traZODone (DESYREL) 100 MG tablet Take 1.5 tablets (150 mg total) by mouth at bedtime. TAKE 1 TABLET AT BEDTIME (Patient taking differently: Take 150 mg by mouth at bedtime.) 135 tablet 5   vitamin B-12 (CYANOCOBALAMIN) 1000 MCG tablet Take 1,000 mcg by mouth daily.      apixaban (ELIQUIS) 5 MG TABS tablet Take 1 tablet (5 mg total) by mouth 2 (two) times daily. 180 tablet 2   FLUoxetine HCl 60 MG TABS 1 tab po qd (Patient not taking: No sig reported) 90 tablet 1   polyvinyl alcohol (LIQUIFILM TEARS) 1.4 % ophthalmic solution Place 1 drop into both eyes as needed for dry eyes. (Patient not taking: No sig reported)     No current facility-administered medications for this  encounter.   Facility-Administered Medications Ordered in Other Encounters  Medication Dose Route Frequency Provider Last Rate Last Admin   furosemide (LASIX) injection 20 mg  20 mg Intravenous Once [place], Holli Humbles, NP       heparin lock flush 100 unit/mL  250 Units Intracatheter PRN [place], Holli Humbles, NP       sodium chloride flush (NS) 0.9 % injection 10 mL  10 mL Intracatheter PRN [place], Holli Humbles, NP        REVIEW OF SYSTEMS:  A 10+ POINT REVIEW OF SYSTEMS WAS OBTAINED including neurology, dermatology, psychiatry, cardiac, respiratory, lymph, extremities, GI, GU, musculoskeletal, constitutional, reproductive, HEENT.  Patient denies any headaches or blurred vision.  He denies any pain along the left groin but does notice swelling in this area.  He denies any chills or fever.  The patient reports chronic problems with lymphedema in both lower extremities related to prior viral infection   PHYSICAL EXAM:  height is 5' 6"  (1.676 m). His temporal temperature is 97.3 F (36.3 C) (abnormal). His blood pressure is 113/74 and his pulse is 70. His respiration is 18 and oxygen saturation is 96%.   General: Alert and oriented, in no acute distress HEENT: Head is normocephalic. Extraocular movements are intact. Oropharynx is clear. Neck: Neck is supple, no palpable cervical or supraclavicular lymphadenopathy. Heart: Regular in rate and rhythm with no murmurs, rubs, or gallops. Chest: Clear to auscultation bilaterally, with no rhonchi, wheezes, or rales. Abdomen: Soft, nontender, nondistended, with no rigidity or guarding. Extremities: No cyanosis or edema. Lymphatics: Dressing in place over the left lower inguinal area with swelling noted in this area.  JP drain in place.  Patient has small scar inferior and medial to this area from his subcutaneous nodule removal Skin: No concerning lesions. Musculoskeletal: symmetric strength and muscle tone throughout. Neurologic: Cranial nerves II through  XII are grossly intact. No obvious focalities. Speech is fluent. Coordination is intact. Psychiatric: Judgment and insight are intact. Affect is appropriate.   ECOG = 1  0 - Asymptomatic (Fully active, able to carry on all predisease activities without restriction)  1 - Symptomatic but completely ambulatory (Restricted in physically strenuous activity but ambulatory and able to carry out work of a light or sedentary nature. For example, light housework, office work)  2 - Symptomatic, <50% in bed during the day (Ambulatory and capable of all self care but unable to carry out any work activities. Up and about more than 50% of waking hours)  3 - Symptomatic, >50% in bed, but not bedbound (Capable of only limited self-care, confined to bed or chair 50% or more of waking hours)  4 - Bedbound (Completely disabled. Cannot carry on any self-care. Totally confined to  bed or chair)  5 - Death   Eustace Pen MM, Creech RH, Tormey DC, et al. [phone]). "Toxicity and response criteria of the [facility]". [place] Oncol. 5 (6): 649-55  LABORATORY DATA:  Lab Results  Component Value Date   WBC 10.5 08/30/2020   HGB 13.0 08/30/2020   HCT 40.8 08/30/2020   MCV 93.4 08/30/2020   PLT 179 08/30/2020   NEUTROABS 9.0 (H) 08/30/2020   Lab Results  Component Value Date   NA 135 08/30/2020   K 4.0 08/30/2020   CL 96 (L) 08/30/2020   CO2 34 (H) 08/30/2020   GLUCOSE 103 (H) 08/30/2020   CREATININE 1.15 08/30/2020   CALCIUM 9.2 08/30/2020      RADIOGRAPHY: No results found.    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  He did have a prior sentinel node procedure along the left inguinal region.  With his original diagnosis the patient was treated with immunotherapy which he tolerated very poorly with significant problems with diarrhea and nausea.  It  is unclear whether he would be a candidate for additional type of immunotherapy.  Given this situation I would recommend postoperative radiation therapy to the left groin and left medial thigh area.  Anticipate approximately 5 and half weeks of radiation therapy.  Patient has been tentatively given a simulation appointment for late June however we may need to delay this given the ongoing drainage from his surgical bed.  Patient will be seeing Dr. Barry Dienes in the next couple weeks for further evaluation concerning this issue.  Today, I talked to the patient about the findings and work-up thus far.  We discussed the natural history of recurrent melanoma and general treatment, highlighting the role of radiotherapy in the management.  We discussed the available radiation techniques, and focused on the details of logistics and delivery.  We reviewed the anticipated acute and late sequelae associated with radiation in this setting.  The patient was encouraged to ask questions that I answered to the best of my ability.  A patient consent form was discussed and signed.  We retained a copy for our records.  The patient would like to proceed with radiation and will be scheduled for CT simulation.  PLAN: Tentative simulation scheduled for late June.  Anticipate 4-5 and half weeks of radiation therapy directed at the operative sites.   60 minutes of total time was spent for this patient encounter, including preparation, face-to-face counseling with the patient and coordination of care, physical exam, and documentation of the encounter.   ------------------------------------------------  Blair Promise, PhD, MD  This document serves as a record of services personally performed by Gery Pray, MD. It was created on his behalf by Roney Mans, a trained medical scribe. The creation of this record is based on the scribe's personal observations and the provider's statements to them. This document has been checked and  approved by the attending provider.

## 2020-09-14 ENCOUNTER — Ambulatory Visit
Admission: RE | Admit: 2020-09-14 | Discharge: 2020-09-14 | Disposition: A | Discharge status: Home / Self Care | Payer: Medicare Other | Source: Ambulatory Visit | Attending: Radiation Oncology | Admitting: Radiation Oncology

## 2020-09-14 ENCOUNTER — Other Ambulatory Visit: Payer: Self-pay

## 2020-09-14 ENCOUNTER — Encounter: Payer: Self-pay | Admitting: Radiation Oncology

## 2020-09-14 VITALS — BP 113/74 | HR 70 | Temp 97.3°F | Resp 18 | Ht 66.0 in

## 2020-09-14 DIAGNOSIS — C4372 Malignant melanoma of left lower limb, including hip: Secondary | ICD-10-CM | POA: Diagnosis not present

## 2020-09-14 NOTE — Progress Notes (Signed)
See MD note for nursing evaluation. °

## 2020-09-18 DIAGNOSIS — Z1159 Encounter for screening for other viral diseases: Secondary | ICD-10-CM | POA: Diagnosis not present

## 2020-09-18 DIAGNOSIS — Z20828 Contact with and (suspected) exposure to other viral communicable diseases: Secondary | ICD-10-CM | POA: Diagnosis not present

## 2020-09-19 ENCOUNTER — Encounter: Payer: Self-pay | Admitting: General Practice

## 2020-09-19 NOTE — Progress Notes (Signed)
Date:  09/26/2020   ID:  [name], DOB [birth year], MRN [id number]  Patient Location: Home Provider Location:  Office   PCP:  Cassandria Anger, MD  Cardiologist:   Johnsie Cancel Electrophysiologist:  None   Evaluation Performed:  Follow-Up Visit  Chief Complaint:  CAD  History of Present Illness:    75 y.o. male with a hx of CAD, paroxysmal atrial fibrillation not on systemic anticoagulation with no recurrence in years and frequent need for DAT for his cAD , hyperlipidemia, COPD, GERD, obstructive sleep apnea, rheumatoid arthritis, and hemochromatosis.   He had left circumflex stent with spiral dissection in 2006. He had TEE DCCV in May 2012. He had h/o post lumbar fusion surgical wound infection with positive blood culture of pseudomonas and required 6 weeks of IV antibiotics near the end of 2014. He was admitted in March 2016 with chest tightness and dyspnea and underwent cardiac catheterization which showed new stenosis in proximal to mid LAD treated with overlapping drug-eluting stents. He was started on aspirin and Plavix. Sees ortho at [place] Dr. Pasty Spillers.   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 Adjuvant XRT Rx stage 3C Plan for 5-6 weeks Rx July    Had colonoscopy 09/11/18 with multiple polyps removed by Dr Ardis Hughs   Living at [facility] now Really enjoys it More active and working out   No angina Thinking of calling his ex after 16 years They were married for 34 years and he adopted her daughter     Past Medical History:  Diagnosis Date   Alcoholism /alcohol abuse    per family   Allergic rhinitis    Allergy    Anxiety    Bacterial infection    CAD (coronary artery  disease)    minimal coronary plaque in the LAD and right coronary system. 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 7-06. Last nuclear stress 11-17-06 fixed anterior/ inferior defect, no inducible ischemia, EF 81%   Cataract    CHF (congestive heart failure) (HCC)    Chronic back pain    "all over back"   Chronic neck pain    COPD (chronic obstructive pulmonary disease) ([place])    Depression    Diverticulosis    DVT (deep venous thrombosis) ([place]) 06/2020   Dyspnea    with exertion   Dysrhythmia 01-24-12   past hx. A.Fib x1 episode-responded to med.   Falls frequently    "since 02/2013" (06/16/2013)   Family history of cancer    Genetic testing 03/06/2017   Multi-Cancer panel (83 genes) @ [facility] - No pathogenic mutations detected   GERD (gastroesophageal reflux disease)    Hemochromatosis    dx'd 14 yrs ago last ferritin Aug 11, 08 52 (22-322), Fe 136 ("I had 250 phlebotomies for that")   High cholesterol    hx   Hx of colonic polyps    Hx of colonoscopy    Hypertension    Iron deficiency anemia due to chronic blood loss 12/27/2016   Malignant melanoma of knee, left ([place])    Myocardial infarction [facility]) 2006   "related to catheterization"  Narcotic abuse ([place])    per family   Neuropathy    fingers and toes   Osteoarthritis    Peripheral edema    Bilateral ankles   PONV (postoperative nausea and vomiting)    Pulmonary embolism ([place]) 06/2020   RA (rheumatoid arthritis) ([place])    Past Surgical History:  Procedure Laterality Date   ABDOMINAL ADHESION SURGERY  ~ 1968   ANKLE RECONSTRUCTION Right 6-09   [facility]   APPENDECTOMY  ~ [place] Left 03/27/2017   Procedure: ASPIRATION OF SEROMA;  Surgeon: Stark Klein, MD;  Location: [facility];  Service: General;  Laterality: Left;   BONE TUMOR RESECTION  ~ 1954   "taken off my mastoid"   [place] Right 1990's   CATARACT  EXTRACTION, BILATERAL Bilateral 01-24-12   CORONARY ANGIOPLASTY WITH STENT PLACEMENT  2006   "while repairing 1st stent, a second area tore and they had to place 2nd stent " ?LAD & CX   CORONARY ANGIOPLASTY WITH STENT PLACEMENT  06/17/2014   CYST EXCISION  "several OR's"   "backX 2, back of my neck, face, inside right bicept, chest, wrist"   FOOT SURGERY Right 11-08   for removal of bone spurs-   HAMMER TOE SURGERY Right 07/2012   "broke 4 hammertoes"    HARDWARE REMOVAL  03/09/2012   Procedure: HARDWARE REMOVAL;  Surgeon: Nita Sells, MD;  Location: [facility];  Service: Orthopedics;  Laterality: Left;  Hardware Removal from Left Shoulder   HARVEST BONE GRAFT  02/06/2012   Procedure: HARVEST ILIAC BONE GRAFT;  Surgeon: Nita Sells, MD;  Location: [facility];  Service: Orthopedics;;  bone marrow aspirqation    INGUINAL HERNIA REPAIR Bilateral    JOINT REPLACEMENT     KNEE SURGERY Left ~ 2003   "6-12 months after uni knee removed synovial sack"   LEFT HEART CATHETERIZATION WITH CORONARY ANGIOGRAM N/A 06/17/2014   PCI of diffuse severe stenosis in the proximal to mid LAD using overlapping drug-eluting stents.   LUMBAR WOUND DEBRIDEMENT N/A 03/17/2013   Procedure: Incision and drainage of superficial lumbar wound;  Surgeon: Floyce Stakes, MD;  Location: [facility];  Service: Neurosurgery;  Laterality: N/A;  Incision and drainage of superficial lumbar wound   LYMPH NODE BIOPSY N/A 08/15/2020   Procedure: EXCISIONAL LYMPH NODE BIOPSY LEFT INGUINAL REGION, EXCISION OF LEFT THIGH NODULE, EXCISION OF LEFT ABDOMINAL WALL LESION;  Surgeon: Stark Klein, MD;  [facility];  Service: General;  Laterality: N/A;   MECKEL DIVERTICULUM EXCISION  ~ [place] LYMPH NODE BIOPSY Left 02/12/2017   WIDE LOCAL EXCISION LEFT KNEE MELANOMA, ADVANCEMENT FLAP CLOSURE, AND SENTINEL LYMPH NODE MAPPING AND BIOPSY.   MELANOMA EXCISION WITH SENTINEL  LYMPH NODE BIOPSY Left 02/12/2017   Procedure: WIDE LOCAL EXCISION LEFT KNEE MELANOMA, ADVANCEMENT FLAP CLOSURE, AND SENTINEL LYMPH NODE MAPPING AND BIOPSY.;  Surgeon: Stark Klein, MD;  Location: [facility];  Service: General;  Laterality: Left;  GENERAL AND LOCAL   ORIF SHOULDER FRACTURE  02/06/2012   Procedure: OPEN REDUCTION INTERNAL FIXATION (ORIF) SHOULDER FRACTURE;  Surgeon: Nita Sells, MD;  [facility];  Service: Orthopedics;  Laterality: Left;  ORIF of a Left Shoulder Fracture with  Iliac Crest Bone Graft aspiration    PORTACATH PLACEMENT Left 03/27/2017   Procedure: INSERTION PORT-A-CATH;  Surgeon: Stark Klein, MD;  Location: [facility];  Service: General;  Laterality: Left;  POSTERIOR LUMBAR FUSION  12-10   L4-5 diskectomy w/ fusion, cage placement and rods; [place]   POSTERIOR LUMBAR FUSION 4 LEVEL N/A 03/02/2013   Procedure: Lumbar One to Sacral One Posterior lumbar interbody fusion;  Surgeon: Floyce Stakes, MD;  Location: [facility];  Service: Neurosurgery;  Laterality: N/A;  L1 to S1 Posterior lumbar interbody fusion   REPLACEMENT UNICONDYLAR JOINT KNEE Left ~ 2003   "~ 6 months after total knee replaced"   SHOULDER ARTHROSCOPY Left ~ 2004 X 2   "@ [facility]; left bone splinter in & had to clean it out"   TOTAL ANKLE REPLACEMENT Right 2008   at [facility] Bilateral 2002   TOTAL SHOULDER REPLACEMENT Left 2006   TOTAL SHOULDER REPLACEMENT Right ~ 2007   Dr. Marlou Sa     Current Meds  Medication Sig   albuterol (PROVENTIL HFA;VENTOLIN HFA) 108 (90 Base) MCG/ACT inhaler Inhale 1-2 puffs into the lungs every 4 (four) hours as needed for wheezing or shortness of breath.   ALPRAZolam (XANAX) 0.5 MG tablet Take 1 tablet (0.5 mg total) by mouth 3 (three) times daily as needed for anxiety. for anxiety   Cholecalciferol (VITAMIN D3) 2000 UNITS capsule Take 1 capsule (2,000 Units total) by mouth daily.   FLUoxetine (PROZAC) 40 MG capsule  Take 40 mg by mouth 2 (two) times daily.   FLUoxetine HCl 60 MG TABS 1 tab po qd   fluticasone (FLONASE) 50 MCG/ACT nasal spray INSTILL 2 SPRAYS IN EACH NOSTRIL EVERY DAY (Patient taking differently: Place 2 sprays into both nostrils daily as needed for allergies.)   gabapentin (NEURONTIN) 300 MG capsule Take 2 capsules (600 mg total) by mouth at bedtime.   HYDROmorphone (DILAUDID) 2 MG tablet Take 1-1.5 tablets (2-3 mg total) by mouth every 8 (eight) hours as needed for severe pain.   lidocaine-prilocaine (EMLA) cream Apply 1 application topically as needed. (Patient taking differently: Apply 1 application topically as needed (port access).)   Melatonin 10 MG TABS Take 10 mg by mouth at bedtime.   metoprolol tartrate (LOPRESSOR) 25 MG tablet TAKE 1 TABLET (25 MG TOTAL) BY MOUTH 2 (TWO) TIMES DAILY. (Patient taking differently: Take 25 mg by mouth 2 (two) times daily.)   pantoprazole (PROTONIX) 40 MG tablet Take 1 tablet (40 mg total) by mouth every evening.   polyvinyl alcohol (LIQUIFILM TEARS) 1.4 % ophthalmic solution Place 1 drop into both eyes as needed for dry eyes.   predniSONE (DELTASONE) 5 MG tablet Take 15 mg by mouth daily.   Pyridoxine HCl (B-6 PO) Take 1 tablet by mouth daily.   torsemide (DEMADEX) 100 MG tablet Take 0.5-1 tablets (50-100 mg total) by mouth daily as needed. (Patient taking differently: Take 50-100 mg by mouth daily as needed (fluid).)   traZODone (DESYREL) 100 MG tablet Take 1.5 tablets (150 mg total) by mouth at bedtime. TAKE 1 TABLET AT BEDTIME (Patient taking differently: Take 150 mg by mouth at bedtime.)   vitamin B-12 (CYANOCOBALAMIN) 1000 MCG tablet Take 1,000 mcg by mouth daily.      Allergies:   Cefepime, Cephaeline, Morphine, Morphine and related, Penicillins, Doxycycline, and Oxycodone-acetaminophen   Social History   Tobacco Use   Smoking status: Never   Smokeless tobacco: Never  Vaping Use   Vaping Use: Never used  Substance Use Topics   Alcohol  use: No    Alcohol/week: 0.0 standard drinks    Comment: "I do not drink anymore"   Drug use:  No     Family Hx: The patient's family history includes Cancer in his maternal grandmother; Cancer - Lung in his mother; Cancer - Prostate (age of onset: 63) in his father; Colon polyps in his father; Coronary artery disease in his father and maternal aunt; Heart attack in his brother and maternal aunt; Hemochromatosis in his sister; Hyperlipidemia in his brother; Hypertension in his father; Lung cancer in his mother; Macular degeneration in his mother; Melanoma in his mother; Other in his brother and mother; Prostate cancer in his father; Rheum arthritis in his sister; Thyroid disease in his sister; Uterine cancer in his mother. There is no history of Diabetes, Colon cancer, Esophageal cancer, Rectal cancer, or Stomach cancer.  ROS:   Please see the history of present illness.     All other systems reviewed and are negative.   Prior CV studies:   The following studies were reviewed today: Cath 06/17/14 LHC 06/17/14 Coronary angiography: Coronary dominance: right Left mainstem: Normal Left anterior descending (LAD): 80% proximal lesion before D1 50% just distal to D1 80% distal D1 30% multiple discrete Left circumflex (LCx):  Primarily AV groove and OM1  Widely patent stents in OM Right coronary artery (RCA):  Dominant 30% proximal  50%  Mid vessel stenosis    Left ventriculography: Left ventricular systolic function is normal, LVEF is estimated at 55-65%, there is no significant mitral regurgitation  Final Conclusions:  New LAD lesions patent stents in OM Recommendations: Reviewed with Dr Burt Knack  PCI/Stent mulitple sights in LAD   Conclusions: Successful PCI of diffuse severe stenosis in the proximal to mid LAD using overlapping drug-eluting stent platforms   Recommendations: Dual antiplatelet therapy with aspirin and Plavix for at least 12 months.     Lesion Data: Vessel: LAD/proximal to  mid Percent stenosis (pre): 80 TIMI-flow (pre):  3 Stent:  2.5 x 24 mm Promus DES and 3.0 x 20 mm Promus DES (overlapping) Percent stenosis (post): 0 TIMI-flow (post): 3   Myovue 2016   Labs/Other Tests and Data Reviewed:    EKG:   03/23/18 SR rate 19 old IMI no acute changes 02/24/19 SR rate 65 normal   Recent Labs: 05/28/2020: Magnesium 1.9 08/30/2020: ALT 17; BUN 19; Creatinine 1.15; Hemoglobin 13.0; Platelet Count 179; Potassium 4.0; Sodium 135   Recent Lipid Panel Lab Results  Component Value Date/Time   CHOL 128 03/10/2015 10:08 AM   TRIG 95.0 03/10/2015 10:08 AM   HDL 39.30 03/10/2015 10:08 AM   CHOLHDL 3 03/10/2015 10:08 AM   LDLCALC 70 03/10/2015 10:08 AM    Wt Readings from Last 3 Encounters:  09/26/20 67.1 kg  08/30/20 66.7 kg  08/15/20 67.1 kg     Objective:    Vital Signs:  BP 116/70   Pulse 63   Ht 5' 6"  (1.676 m)   Wt 67.1 kg   SpO2 95%   BMI 23.89 kg/m    Affect appropriate Healthy:  appears stated age 75: normal Neck supple with no adenopathy JVP normal no bruits no thyromegaly Lungs clear with no wheezing and good diaphragmatic motion Heart:  S1/S2 no murmur, no rub, gallop or click PMI normal Abdomen: benighn, BS positve, no tenderness, no AAA no bruit.  No HSM or HJR Drain in left groin with lymphadenopathy and edema Distal pulses intact with no bruits No edema Neuro non-focal Skin warm and dry No muscular weakness    ASSESSMENT & PLAN:    1. CAD s/p overlapping DESx2 to LAD on 06/17/2014  Previous stenting circumflex 2006 with spiral dissection : no angina, Continue plavix Since not taking ASA P2Y has been good in past    2. COPD: stable no active wheezing    3. PAF:  s/p TEE DCCV in May 2012,  maintaining NSR    4. Hyperlipidemia: continue pravachol labs with primary    5. OSA:  not able to use CPAP at night discussed going to dentist for oral device    6. Rheumatoid arthritis continue pain meds Inactivity has not helped      7. Melanoma: recurrent left thigh stage 3C XRT Rx to start July   8. Hemochromatosis :  Stable with history of phlebotomy most recent 02/18/20  to keep ferritin less than 100 and FESO4 Rx Most recent value 88 07/19/19   9. PE/DVT:  05/2020 ? Hypercoagulable from cancer on eliquis 5 bid   COVID-19 Education: The signs and symptoms of COVID-19 were discussed with the patient and how to seek care for testing (follow up with PCP or arrange E-visit).  The importance of social distancing was discussed today.   Medication Adjustments/Labs and Tests Ordered: Current medicines are reviewed at length with the patient today.  Concerns regarding medicines are outlined above.   Tests Ordered:  None   Medication Changes:  None   Disposition:  Follow up in 6 months   Signed, Jenkins Rouge, MD  09/26/2020 10:41 AM    [place]

## 2020-09-19 NOTE — Progress Notes (Signed)
[place] Psychosocial Distress Screening Clinical Social Work  Clinical Social Work was referred by distress screening protocol.  The patient scored a 6 on the Psychosocial Distress Thermometer which indicates moderate distress. Clinical Social Worker contacted patient by phone to assess for distress and other psychosocial needs. Called patient, no answer, VM full, unable to leave message.  Will try again.    ONCBCN DISTRESS SCREENING 09/14/2020  Screening Type Initial Screening  Distress experienced in past week (1-10) 6  Emotional problem type Nervousness/Anxiety  Physical Problem type Sleep/insomnia;Pain;Getting around;Breathing;Tingling hands/feet;Skin dry/itchy;Swollen arms/legs  Physician notified of physical symptoms Yes  Referral to clinical social work Yes    Clinical Social Worker follow up needed: Yes.  , will call again  If yes, follow up plan:  Beverely Pace, LCSW

## 2020-09-22 ENCOUNTER — Telehealth: Payer: Self-pay | Admitting: General Practice

## 2020-09-22 NOTE — Telephone Encounter (Signed)
[place] CSW Progress Notes  Second call to patient to address Distress Screen, VM full and CSW could not leave message.  Edwyna Shell, LCSW Clinical Social Worker Phone:  [phone]

## 2020-09-25 ENCOUNTER — Encounter: Payer: Self-pay | Admitting: *Deleted

## 2020-09-25 DIAGNOSIS — Z20828 Contact with and (suspected) exposure to other viral communicable diseases: Secondary | ICD-10-CM | POA: Diagnosis not present

## 2020-09-25 DIAGNOSIS — Z1159 Encounter for screening for other viral diseases: Secondary | ICD-10-CM | POA: Diagnosis not present

## 2020-09-25 NOTE — Progress Notes (Signed)
[place] Work  Clinical Social Work made 3rd attempt to contact patient regarding distress screen referral.  Voicemail was full and unable to leave a message.  Johnnye Lana, MSW, LCSW, OSW-C Clinical Social Worker [facility] [phone]

## 2020-09-26 ENCOUNTER — Ambulatory Visit (INDEPENDENT_AMBULATORY_CARE_PROVIDER_SITE_OTHER): Payer: Medicare Other | Admitting: Cardiovascular Disease

## 2020-09-26 ENCOUNTER — Encounter: Payer: Self-pay | Admitting: Cardiovascular Disease

## 2020-09-26 ENCOUNTER — Other Ambulatory Visit: Payer: Self-pay

## 2020-09-26 VITALS — BP 116/70 | HR 63 | Ht 66.0 in | Wt 148.0 lb

## 2020-09-26 DIAGNOSIS — I2510 Atherosclerotic heart disease of native coronary artery without angina pectoris: Secondary | ICD-10-CM

## 2020-09-26 DIAGNOSIS — C4372 Malignant melanoma of left lower limb, including hip: Secondary | ICD-10-CM | POA: Diagnosis not present

## 2020-09-26 DIAGNOSIS — I480 Paroxysmal atrial fibrillation: Secondary | ICD-10-CM | POA: Diagnosis not present

## 2020-09-26 NOTE — Patient Instructions (Signed)

## 2020-09-27 ENCOUNTER — Telehealth: Payer: Self-pay

## 2020-10-02 ENCOUNTER — Other Ambulatory Visit: Payer: Self-pay

## 2020-10-02 ENCOUNTER — Ambulatory Visit
Admission: RE | Admit: 2020-10-02 | Discharge: 2020-10-02 | Disposition: A | Discharge status: Home / Self Care | Payer: Medicare Other | Source: Ambulatory Visit | Attending: Radiation Oncology | Admitting: Radiation Oncology

## 2020-10-02 DIAGNOSIS — Z20828 Contact with and (suspected) exposure to other viral communicable diseases: Secondary | ICD-10-CM | POA: Diagnosis not present

## 2020-10-02 DIAGNOSIS — C4372 Malignant melanoma of left lower limb, including hip: Secondary | ICD-10-CM | POA: Diagnosis not present

## 2020-10-02 DIAGNOSIS — Z1159 Encounter for screening for other viral diseases: Secondary | ICD-10-CM | POA: Diagnosis not present

## 2020-10-05 DIAGNOSIS — C4372 Malignant melanoma of left lower limb, including hip: Secondary | ICD-10-CM | POA: Diagnosis not present

## 2020-10-09 DIAGNOSIS — Z20828 Contact with and (suspected) exposure to other viral communicable diseases: Secondary | ICD-10-CM | POA: Diagnosis not present

## 2020-10-09 DIAGNOSIS — Z1159 Encounter for screening for other viral diseases: Secondary | ICD-10-CM | POA: Diagnosis not present

## 2020-10-10 ENCOUNTER — Ambulatory Visit
Admission: RE | Admit: 2020-10-10 | Discharge: 2020-10-10 | Disposition: A | Discharge status: Home / Self Care | Payer: Medicare Other | Source: Ambulatory Visit | Attending: Radiation Oncology | Admitting: Radiation Oncology

## 2020-10-10 ENCOUNTER — Encounter: Payer: Self-pay | Admitting: Internal Medicine

## 2020-10-10 ENCOUNTER — Ambulatory Visit: Payer: Medicare Other | Admitting: Radiation Oncology

## 2020-10-10 ENCOUNTER — Other Ambulatory Visit: Payer: Self-pay

## 2020-10-10 ENCOUNTER — Ambulatory Visit (INDEPENDENT_AMBULATORY_CARE_PROVIDER_SITE_OTHER): Payer: Medicare Other | Admitting: Internal Medicine

## 2020-10-10 DIAGNOSIS — I480 Paroxysmal atrial fibrillation: Secondary | ICD-10-CM

## 2020-10-10 DIAGNOSIS — C4372 Malignant melanoma of left lower limb, including hip: Secondary | ICD-10-CM

## 2020-10-10 DIAGNOSIS — R5383 Other fatigue: Secondary | ICD-10-CM

## 2020-10-10 DIAGNOSIS — I2510 Atherosclerotic heart disease of native coronary artery without angina pectoris: Secondary | ICD-10-CM | POA: Diagnosis not present

## 2020-10-10 DIAGNOSIS — I890 Lymphedema, not elsewhere classified: Secondary | ICD-10-CM | POA: Insufficient documentation

## 2020-10-10 DIAGNOSIS — R609 Edema, unspecified: Secondary | ICD-10-CM

## 2020-10-10 DIAGNOSIS — F419 Anxiety disorder, unspecified: Secondary | ICD-10-CM | POA: Diagnosis not present

## 2020-10-10 DIAGNOSIS — Z510 Encounter for antineoplastic radiation therapy: Secondary | ICD-10-CM | POA: Insufficient documentation

## 2020-10-10 DIAGNOSIS — M05711 Rheumatoid arthritis with rheumatoid factor of right shoulder without organ or systems involvement: Secondary | ICD-10-CM

## 2020-10-10 MED ORDER — ALPRAZOLAM 0.5 MG PO TABS: 0.5000 mg | ORAL_TABLET | Freq: Three times a day (TID) | ORAL | 1 refills | Status: DC | PRN

## 2020-10-10 MED ORDER — FUROSEMIDE 20 MG PO TABS: 20.0000 mg | ORAL_TABLET | Freq: Every day | ORAL | 3 refills | Status: DC

## 2020-10-10 MED ORDER — HYDROMORPHONE HCL 2 MG PO TABS: 2.0000 mg | ORAL_TABLET | Freq: Three times a day (TID) | ORAL | 0 refills | Status: DC | PRN

## 2020-10-10 NOTE — Progress Notes (Signed)
Subjective:  Patient ID: [name], male    DOB: Jul 28, 1945  Age: 75 y.o. MRN: [id number]  CC: Follow-up (2 month f/u)   HPI [name] presents for chronic pain, anxiety, metastatic melanoma relapse - starting XRT today, chemo later this week  Outpatient Medications Prior to Visit  Medication Sig Dispense Refill   albuterol (PROVENTIL HFA;VENTOLIN HFA) 108 (90 Base) MCG/ACT inhaler Inhale 1-2 puffs into the lungs every 4 (four) hours as needed for wheezing or shortness of breath. 1 Inhaler 1   ALPRAZolam (XANAX) 0.5 MG tablet Take 1 tablet (0.5 mg total) by mouth 3 (three) times daily as needed for anxiety. for anxiety 180 tablet 1   Cholecalciferol (VITAMIN D3) 2000 UNITS capsule Take 1 capsule (2,000 Units total) by mouth daily. 100 capsule 3   FLUoxetine (PROZAC) 40 MG capsule Take 40 mg by mouth 2 (two) times daily.     FLUoxetine HCl 60 MG TABS 1 tab po qd 90 tablet 1   fluticasone (FLONASE) 50 MCG/ACT nasal spray INSTILL 2 SPRAYS IN EACH NOSTRIL EVERY DAY (Patient taking differently: Place 2 sprays into both nostrils daily as needed for allergies.) 16 g 11   gabapentin (NEURONTIN) 300 MG capsule Take 2 capsules (600 mg total) by mouth at bedtime. 180 capsule 2   HYDROmorphone (DILAUDID) 2 MG tablet Take 1-1.5 tablets (2-3 mg total) by mouth every 8 (eight) hours as needed for severe pain. 90 tablet 0   Melatonin 10 MG TABS Take 10 mg by mouth at bedtime.     metoprolol tartrate (LOPRESSOR) 25 MG tablet TAKE 1 TABLET (25 MG TOTAL) BY MOUTH 2 (TWO) TIMES DAILY. (Patient taking differently: Take 25 mg by mouth 2 (two) times daily.) 180 tablet 3   pantoprazole (PROTONIX) 40 MG tablet Take 1 tablet (40 mg total) by mouth every evening. 90 tablet 3   polyvinyl alcohol (LIQUIFILM TEARS) 1.4 % ophthalmic solution Place 1 drop into both eyes as needed for dry eyes.     predniSONE (DELTASONE) 5 MG tablet Take 15 mg by mouth daily.     Pyridoxine HCl (B-6 PO) Take 1 tablet by  mouth daily.     torsemide (DEMADEX) 100 MG tablet Take 0.5-1 tablets (50-100 mg total) by mouth daily as needed. (Patient taking differently: Take 50-100 mg by mouth daily as needed (fluid).) 90 tablet 1   traZODone (DESYREL) 100 MG tablet Take 1.5 tablets (150 mg total) by mouth at bedtime. TAKE 1 TABLET AT BEDTIME (Patient taking differently: Take 150 mg by mouth at bedtime.) 135 tablet 5   vitamin B-12 (CYANOCOBALAMIN) 1000 MCG tablet Take 1,000 mcg by mouth daily.      apixaban (ELIQUIS) 5 MG TABS tablet Take 1 tablet (5 mg total) by mouth 2 (two) times daily. 180 tablet 2   lidocaine-prilocaine (EMLA) cream Apply 1 application topically as needed. (Patient not taking: Reported on 10/10/2020) 30 g 5   Facility-Administered Medications Prior to Visit  Medication Dose Route Frequency Provider Last Rate Last Admin   furosemide (LASIX) injection 20 mg  20 mg Intravenous Once [place], Holli Humbles, NP       heparin lock flush 100 unit/mL  250 Units Intracatheter PRN [place], Holli Humbles, NP       sodium chloride flush (NS) 0.9 % injection 10 mL  10 mL Intracatheter PRN [place], Holli Humbles, NP        ROS: Review of Systems  Constitutional:  Positive for fatigue.  Cardiovascular:  Positive  for leg swelling.  Musculoskeletal:  Positive for arthralgias, back pain and gait problem.  Skin:  Positive for wound.  Neurological:  Positive for weakness.   Objective:  BP 120/82 (BP Location: Left Arm)   Pulse 62   Temp 98.1 F (36.7 C) (Oral)   Ht 5' 6"  (1.676 m)   Wt 153 lb 9.6 oz (69.7 kg)   SpO2 94%   BMI 24.79 kg/m   BP Readings from Last 3 Encounters:  10/10/20 120/82  09/26/20 116/70  09/14/20 113/74    Wt Readings from Last 3 Encounters:  10/10/20 153 lb 9.6 oz (69.7 kg)  09/26/20 148 lb (67.1 kg)  08/30/20 147 lb 1.9 oz (66.7 kg)    Physical Exam Constitutional:      General: He is not in acute distress.    Appearance: He is well-developed. He is obese.     Comments: NAD   Eyes:     Conjunctiva/sclera: Conjunctivae normal.     Pupils: Pupils are equal, round, and reactive to light.  Neck:     Thyroid: No thyromegaly.     Vascular: No JVD.  Cardiovascular:     Rate and Rhythm: Normal rate and regular rhythm.     Heart sounds: Normal heart sounds. No murmur heard.   No friction rub. No gallop.  Pulmonary:     Effort: Pulmonary effort is normal. No respiratory distress.     Breath sounds: Normal breath sounds. No wheezing or rales.  Chest:     Chest wall: No tenderness.  Abdominal:     General: Bowel sounds are normal. There is no distension.     Palpations: Abdomen is soft. There is no mass.     Tenderness: There is no abdominal tenderness. There is no guarding or rebound.  Musculoskeletal:        General: Tenderness present. Normal range of motion.     Cervical back: Normal range of motion.     Right lower leg: Edema present.     Left lower leg: Edema present.  Lymphadenopathy:     Cervical: No cervical adenopathy.  Skin:    General: Skin is warm and dry.     Findings: No erythema or rash.  Neurological:     Mental Status: He is alert and oriented to person, place, and time.     Cranial Nerves: No cranial nerve deficit.     Motor: Weakness present. No abnormal muscle tone.     Coordination: Coordination abnormal.     Gait: Gait abnormal.     Deep Tendon Reflexes: Reflexes are normal and symmetric.  Psychiatric:        Behavior: Behavior normal.        Thought Content: Thought content normal.        Judgment: Judgment normal.   B ankle edema 1-2+ Large oval swelling on the L ant prox thigh  Lab Results  Component Value Date   WBC 10.5 08/30/2020   HGB 13.0 08/30/2020   HCT 40.8 08/30/2020   PLT 179 08/30/2020   GLUCOSE 103 (H) 08/30/2020   CHOL 128 03/10/2015   TRIG 95.0 03/10/2015   HDL 39.30 03/10/2015   LDLCALC 70 03/10/2015   ALT 17 08/30/2020   AST 13 (L) 08/30/2020   NA 135 08/30/2020   K 4.0 08/30/2020   CL 96 (L)  08/30/2020   CREATININE 1.15 08/30/2020   BUN 19 08/30/2020   CO2 34 (H) 08/30/2020   TSH 0.680 09/18/2017   PSA  1.95 10/15/2010   INR 1.1 (H) 06/14/2014   HGBA1C 6.0 09/28/2015    No results found.  Assessment & Plan:     [name], MD

## 2020-10-10 NOTE — Assessment & Plan Note (Signed)
Cont on Xanax prn  Potential benefits of a long term benzodiazepines  use as well as potential risks  and complications were explained to the patient and were aknowledged. Not to take w/dilaudid Cont on Fluoxetine 60 mg a day

## 2020-10-10 NOTE — Assessment & Plan Note (Signed)
CFS - melanoma/pain related Treat melanoma, pain

## 2020-10-10 NOTE — Assessment & Plan Note (Addendum)
Worse Re-start Torsemide prn

## 2020-10-10 NOTE — Assessment & Plan Note (Signed)
On Eliquis

## 2020-10-10 NOTE — Assessment & Plan Note (Signed)
Seroma - L groin - he will see Dr Barry Dienes

## 2020-10-10 NOTE — Assessment & Plan Note (Signed)
Cont on Dilaudid Chronic pain  Potential benefits of a long term opioids use as well as potential risks (i.e. addiction risk, apnea etc) and complications (i.e. Somnolence, constipation and others) were explained to the patient and were aknowledged. ?trekking poles for walking

## 2020-10-11 ENCOUNTER — Ambulatory Visit
Admission: RE | Admit: 2020-10-11 | Discharge: 2020-10-11 | Disposition: A | Discharge status: Home / Self Care | Payer: Medicare Other | Source: Ambulatory Visit | Attending: Radiation Oncology | Admitting: Radiation Oncology

## 2020-10-11 DIAGNOSIS — C4372 Malignant melanoma of left lower limb, including hip: Secondary | ICD-10-CM | POA: Diagnosis not present

## 2020-10-11 DIAGNOSIS — D1801 Hemangioma of skin and subcutaneous tissue: Secondary | ICD-10-CM | POA: Diagnosis not present

## 2020-10-11 DIAGNOSIS — Z510 Encounter for antineoplastic radiation therapy: Secondary | ICD-10-CM | POA: Diagnosis not present

## 2020-10-11 DIAGNOSIS — I890 Lymphedema, not elsewhere classified: Secondary | ICD-10-CM | POA: Diagnosis not present

## 2020-10-11 DIAGNOSIS — L720 Epidermal cyst: Secondary | ICD-10-CM | POA: Diagnosis not present

## 2020-10-11 DIAGNOSIS — L821 Other seborrheic keratosis: Secondary | ICD-10-CM | POA: Diagnosis not present

## 2020-10-11 DIAGNOSIS — D692 Other nonthrombocytopenic purpura: Secondary | ICD-10-CM | POA: Diagnosis not present

## 2020-10-11 DIAGNOSIS — Z85828 Personal history of other malignant neoplasm of skin: Secondary | ICD-10-CM | POA: Diagnosis not present

## 2020-10-12 ENCOUNTER — Inpatient Hospital Stay: Payer: Medicare Other

## 2020-10-12 ENCOUNTER — Other Ambulatory Visit: Payer: Self-pay

## 2020-10-12 ENCOUNTER — Encounter: Payer: Self-pay | Admitting: Hematology & Oncology

## 2020-10-12 ENCOUNTER — Ambulatory Visit
Admission: RE | Admit: 2020-10-12 | Discharge: 2020-10-12 | Disposition: A | Discharge status: Home / Self Care | Payer: Medicare Other | Source: Ambulatory Visit | Attending: Radiation Oncology | Admitting: Radiation Oncology

## 2020-10-12 ENCOUNTER — Inpatient Hospital Stay (HOSPITAL_BASED_OUTPATIENT_CLINIC_OR_DEPARTMENT_OTHER): Payer: Medicare Other | Admitting: Hematology & Oncology

## 2020-10-12 VITALS — BP 116/67 | HR 63 | Temp 97.4°F | Resp 17 | Wt 151.0 lb

## 2020-10-12 DIAGNOSIS — Z7901 Long term (current) use of anticoagulants: Secondary | ICD-10-CM | POA: Insufficient documentation

## 2020-10-12 DIAGNOSIS — D509 Iron deficiency anemia, unspecified: Secondary | ICD-10-CM | POA: Insufficient documentation

## 2020-10-12 DIAGNOSIS — Z95828 Presence of other vascular implants and grafts: Secondary | ICD-10-CM

## 2020-10-12 DIAGNOSIS — C774 Secondary and unspecified malignant neoplasm of inguinal and lower limb lymph nodes: Secondary | ICD-10-CM | POA: Insufficient documentation

## 2020-10-12 DIAGNOSIS — Z86711 Personal history of pulmonary embolism: Secondary | ICD-10-CM | POA: Insufficient documentation

## 2020-10-12 DIAGNOSIS — I890 Lymphedema, not elsewhere classified: Secondary | ICD-10-CM | POA: Diagnosis not present

## 2020-10-12 DIAGNOSIS — Z79899 Other long term (current) drug therapy: Secondary | ICD-10-CM | POA: Insufficient documentation

## 2020-10-12 DIAGNOSIS — C4372 Malignant melanoma of left lower limb, including hip: Secondary | ICD-10-CM | POA: Insufficient documentation

## 2020-10-12 DIAGNOSIS — C792 Secondary malignant neoplasm of skin: Secondary | ICD-10-CM | POA: Insufficient documentation

## 2020-10-12 DIAGNOSIS — Z7189 Other specified counseling: Secondary | ICD-10-CM

## 2020-10-12 DIAGNOSIS — Z510 Encounter for antineoplastic radiation therapy: Secondary | ICD-10-CM | POA: Diagnosis not present

## 2020-10-12 HISTORY — DX: Other specified counseling: Z71.89

## 2020-10-12 LAB — CMP (CANCER CENTER ONLY)
ALT: 13 U/L (ref 0–44)
AST: 13 U/L — ABNORMAL LOW (ref 15–41)
Albumin: 3.5 g/dL (ref 3.5–5.0)
Alkaline Phosphatase: 42 U/L (ref 38–126)
Anion gap: 4 — ABNORMAL LOW (ref 5–15)
BUN: 20 mg/dL (ref 8–23)
CO2: 34 mmol/L — ABNORMAL HIGH (ref 22–32)
Calcium: 8.9 mg/dL (ref 8.9–10.3)
Chloride: 99 mmol/L (ref 98–111)
Creatinine: 0.96 mg/dL (ref 0.61–1.24)
GFR, Estimated: >60 mL/min (ref >60)
Glucose, Bld: 148 mg/dL — ABNORMAL HIGH (ref 70–99)
Potassium: 4.4 mmol/L (ref 3.5–5.1)
Sodium: 137 mmol/L (ref 135–145)
Total Bilirubin: 0.6 mg/dL (ref 0.3–1.2)
Total Protein: 5.5 g/dL — ABNORMAL LOW (ref 6.5–8.1)

## 2020-10-12 LAB — RETICULOCYTES
Immature Retic Fract: 12.5 % (ref 2.3–15.9)
RBC.: 4.02 MIL/uL — ABNORMAL LOW (ref 4.22–5.81)
Retic Count, Absolute: 80.4 10*3/uL (ref 19.0–186.0)
Retic Ct Pct: 2 % (ref 0.4–3.1)

## 2020-10-12 LAB — CBC WITH DIFFERENTIAL (CANCER CENTER ONLY)
Abs Immature Granulocytes: 0.04 10*3/uL (ref 0.00–0.07)
Basophils Absolute: 0 10*3/uL (ref 0.0–0.1)
Basophils Relative: 0 %
Eosinophils Absolute: 0 10*3/uL (ref 0.0–0.5)
Eosinophils Relative: 0 %
HCT: 39.2 % (ref 39.0–52.0)
Hemoglobin: 12 g/dL — ABNORMAL LOW (ref 13.0–17.0)
Immature Granulocytes: 0 %
Lymphocytes Relative: 8 %
Lymphs Abs: 0.7 10*3/uL (ref 0.7–4.0)
MCH: 29.5 pg (ref 26.0–34.0)
MCHC: 30.6 g/dL (ref 30.0–36.0)
MCV: 96.3 fL (ref 80.0–100.0)
Monocytes Absolute: 0.3 10*3/uL (ref 0.1–1.0)
Monocytes Relative: 3 %
Neutro Abs: 8.5 10*3/uL — ABNORMAL HIGH (ref 1.7–7.7)
Neutrophils Relative %: 89 %
Platelet Count: 181 10*3/uL (ref 150–400)
RBC: 4.07 MIL/uL — ABNORMAL LOW (ref 4.22–5.81)
RDW: 13.8 % (ref 11.5–15.5)
WBC Count: 9.6 10*3/uL (ref 4.0–10.5)
nRBC: 0 % (ref 0.0–0.2)

## 2020-10-12 LAB — LACTATE DEHYDROGENASE: LDH: 163 U/L (ref 98–192)

## 2020-10-12 MED ORDER — TEMAZEPAM 15 MG PO CAPS: 15.0000 mg | ORAL_CAPSULE | Freq: Every evening | ORAL | 0 refills | Status: DC | PRN

## 2020-10-12 MED ORDER — HEPARIN SOD (PORK) LOCK FLUSH 100 UNIT/ML IV SOLN
500.0000 [IU] | Freq: Once | INTRAVENOUS | Status: AC
  Administered 2020-10-12: 500 [IU] via INTRAVENOUS
  Filled 2020-10-12: qty 5

## 2020-10-12 MED ORDER — SODIUM CHLORIDE 0.9% FLUSH
10.0000 mL | Freq: Once | INTRAVENOUS | Status: AC
  Administered 2020-10-12: 10 mL via INTRAVENOUS
  Filled 2020-10-12: qty 10

## 2020-10-12 NOTE — Progress Notes (Signed)
Hematology and Oncology Follow Up Visit  [name] [id number] [birth year] 75 y.o. 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  He does have recurrent melanoma.  He did have a lymph node biopsy done by [name].  This was done on 08/15/2020.  The pathology report ([id number]) showed that he had metastatic melanoma.  He had lymphoma and a left thigh nodule and a lymphoma and a left inguinal lymph node.  He has had problems with recurrent seroma in the left inguinal area.  He has been getting drainage from [name] on a constant basis.  It looks like this is finally starting to slow down.  He really has not had a lot of radiation as of yet.  I think he has had 2 or 3 radiation doses.  I suspect he is  Need referral to a lymphedema clinic.  He has swelling in the right leg.  There is a little bit of swelling in the left leg.  I told him that he is going to wear compression stockings.  This can certainly help with some of that swelling.  He is not sleeping all that well.  He has been on trazodone for quite a while.  This did not seem to be helping all that much.  I will try him on Restoril at 15 mg p.o. nightly.  He has had no problems bleeding.  He is on Eliquis for thromboembolic disease.  There is no change in bowel or  bladder habits.  He has had no cough.  His appetite is doing a little better.  He has had no nausea or vomiting.  Overall, his performance status is ECOG 1.     Medications:  Allergies as of 10/12/2020       Reactions   Cefepime Hives, Shortness Of Breath   Cephaeline Hives   Morphine Swelling   A swollen stomach.   Morphine And Related Shortness Of Breath, Nausea And Vomiting, Swelling, Other (See Comments)   Agitation, tolerates dilaudid Other reaction(s): Other (See Comments) Agitation, tolerates dilaudid   Penicillins Hives, Shortness Of Breath, Rash   Has patient had a PCN reaction causing immediate rash, facial/tongue/throat swelling, SOB or lightheadedness with hypotension: Yes Has patient had a PCN reaction causing severe rash involving mucus membranes or skin necrosis: Yes Has patient had a PCN reaction that required hospitalization No Has patient had a PCN reaction occurring within the last 10 years: No If all of the above answers are "NO", then may proceed with Cephalosporin use.   Doxycycline Rash   Oxycodone-acetaminophen Other (See Comments)   Patient doesn't remember what type of reaction.        Medication List        Accurate as of October 12, 2020  4:08 PM. If you have any  questions, ask your nurse or doctor.          albuterol 108 (90 Base) MCG/ACT inhaler Commonly known as: VENTOLIN HFA Inhale 1-2 puffs into the lungs every 4 (four) hours as needed for wheezing or shortness of breath.   ALPRAZolam 0.5 MG tablet Commonly known as: XANAX Take 1 tablet (0.5 mg total) by mouth 3 (three) times daily as needed for anxiety. for anxiety   apixaban 5 MG Tabs tablet Commonly known as: ELIQUIS Take 1 tablet (5 mg total) by mouth 2 (two) times daily.   B-6 PO Take 1 tablet by mouth daily.   FLUoxetine HCl 60 MG Tabs 1 tab po qd   fluticasone 50 MCG/ACT nasal spray Commonly known as: FLONASE INSTILL 2 SPRAYS IN EACH NOSTRIL EVERY DAY What changed:   how much to take how to take this when to take this reasons to take this additional instructions   furosemide 20 MG tablet Commonly known as: LASIX Take 1-2 tablets (20-40 mg total) by mouth daily.   gabapentin 300 MG capsule Commonly known as: NEURONTIN Take 2 capsules (600 mg total) by mouth at bedtime.   HYDROmorphone 2 MG tablet Commonly known as: DILAUDID Take 1-1.5 tablets (2-3 mg total) by mouth every 8 (eight) hours as needed for severe pain. What changed: Another medication with the same name was removed. Continue taking this medication, and follow the directions you see here. Changed by: [name], MD   lidocaine-prilocaine cream Commonly known as: EMLA Apply 1 application topically as needed.   LORazepam 0.5 MG tablet Commonly known as: ATIVAN Take 0.5 mg by mouth.   Melatonin 10 MG Tabs Take 10 mg by mouth at bedtime.   metoprolol tartrate 25 MG tablet Commonly known as: LOPRESSOR TAKE 1 TABLET (25 MG TOTAL) BY MOUTH 2 (TWO) TIMES DAILY.   pantoprazole 40 MG tablet Commonly known as: PROTONIX Take 1 tablet (40 mg total) by mouth every evening.   polyvinyl alcohol 1.4 % ophthalmic solution Commonly known as: LIQUIFILM TEARS Place 1 drop into both eyes as needed for dry eyes.   predniSONE 5 MG tablet Commonly known as: DELTASONE Take 15 mg by mouth daily.   torsemide 100 MG tablet Commonly known as: DEMADEX Take 0.5-1 tablets (50-100 mg total) by mouth daily as needed. What changed: reasons to take this   traZODone 100 MG tablet Commonly known as: DESYREL Take 1.5 tablets (150 mg total) by mouth at bedtime. TAKE 1 TABLET AT BEDTIME What changed: additional instructions   vitamin B-12 1000 MCG tablet Commonly known as: CYANOCOBALAMIN Take 1,000 mcg by mouth daily.   Vitamin D3 50 MCG (2000 UT) capsule Take 1 capsule (2,000 Units total) by mouth daily.        Allergies:  Allergies  Allergen Reactions   Cefepime Hives and Shortness  Of Breath   Cephaeline Hives   Morphine Swelling    A swollen stomach.   Morphine And Related Shortness Of Breath, Nausea And Vomiting, Swelling and Other (See Comments)    Agitation, tolerates dilaudid Other reaction(s): Other (See Comments) Agitation, tolerates dilaudid   Penicillins Hives, Shortness Of Breath and Rash    Has patient had a PCN reaction causing immediate rash, facial/tongue/throat swelling, SOB or lightheadedness with hypotension: Yes Has patient had a PCN reaction causing severe rash involving mucus membranes or skin necrosis: Yes Has patient had a PCN reaction that required hospitalization No Has patient had a PCN reaction occurring within the last 10 years: No  If all of the above answers are "NO", then may proceed with Cephalosporin use.    Doxycycline Rash   Oxycodone-Acetaminophen Other (See Comments)    Patient doesn't remember what type of reaction.    Past Medical History, Surgical history, Social history, and Family History were reviewed and updated.  Review of Systems: Review of Systems  Constitutional:  Positive for malaise/fatigue.  HENT: Negative.    Eyes: Negative.   Respiratory:  Positive for shortness of breath.   Cardiovascular:  Positive for palpitations.  Gastrointestinal:  Positive for nausea.  Genitourinary: Negative.   Musculoskeletal:  Positive for joint pain and neck pain.  Skin: Negative.   Neurological:  Positive for dizziness.  Endo/Heme/Allergies:  Bruises/bleeds easily.  Psychiatric/Behavioral: Negative.       Physical Exam:  weight is 151 lb (68.5 kg). His oral temperature is 97.4 F (36.3 C) (abnormal). His blood pressure is 116/67 and his pulse is 63. His respiration is 17 and oxygen saturation is 92%.   Wt Readings from Last 3 Encounters:  10/12/20 151 lb (68.5 kg)  10/10/20 153 lb 9.6 oz (69.7 kg)  09/26/20 148 lb (67.1 kg)    Physical Exam Vitals reviewed.  HENT:     Head: Normocephalic and atraumatic.  Eyes:      Pupils: Pupils are equal, round, and reactive to light.  Cardiovascular:     Rate and Rhythm: Normal rate and regular rhythm.     Heart sounds: Normal heart sounds.  Pulmonary:     Effort: Pulmonary effort is normal.     Breath sounds: Normal breath sounds.  Abdominal:     General: Bowel sounds are normal.     Palpations: Abdomen is soft.     Comments: Abdominal exam does show a palpable lymph node in the left inguinal region.  This is nontender.  It measures about 2 cm.  It is mobile.  Musculoskeletal:        General: No tenderness or deformity. Normal range of motion.     Cervical back: Normal range of motion.     Comments: He has swelling in both legs.  There is more swelling in the left leg.  He has the inguinal incisional scar in the left inguinal area.  There is a little bit of firmness and erythema in the left inguinal region.  He has decent range of motion of his joints.    Lymphadenopathy:     Cervical: No cervical adenopathy.  Skin:    General: Skin is warm and dry.     Findings: No erythema or rash.  Neurological:     Mental Status: He is alert and oriented to person, place, and time.  Psychiatric:        Behavior: Behavior normal.        Thought Content: Thought content normal.        Judgment: Judgment normal.   Lab Results  Component Value Date   WBC 9.6 10/12/2020   HGB 12.0 (L) 10/12/2020   HCT 39.2 10/12/2020   MCV 96.3 10/12/2020   PLT 181 10/12/2020   Lab Results  Component Value Date   FERRITIN 64 08/30/2020   IRON 45 08/30/2020   TIBC 220 08/30/2020   UIBC 176 08/30/2020   IRONPCTSAT 20 08/30/2020   Lab Results  Component Value Date   RETICCTPCT 2.0 10/12/2020   RBC 4.07 (L) 10/12/2020   RBC 4.02 (L) 10/12/2020   No results found for: KPAFRELGTCHN, LAMBDASER, KAPLAMBRATIO No results found for: IGGSERUM,  IGA, IGMSERUM No results found for: Odetta Pink, SPEI   Chemistry      Component  Value Date/Time   NA 135 08/30/2020 1255   NA 140 04/10/2017 1046   NA 135 (L) 03/06/2017 1104   K 4.0 08/30/2020 1255   K 3.7 04/10/2017 1046   K 4.2 03/06/2017 1104   CL 96 (L) 08/30/2020 1255   CL 98 04/10/2017 1046   CO2 34 (H) 08/30/2020 1255   CO2 31 04/10/2017 1046   CO2 30 (H) 03/06/2017 1104   BUN 19 08/30/2020 1255   BUN 21 04/10/2017 1046   BUN 20.0 03/06/2017 1104   CREATININE 1.15 08/30/2020 1255   CREATININE 0.9 04/10/2017 1046   CREATININE 1.0 03/06/2017 1104      Component Value Date/Time   CALCIUM 9.2 08/30/2020 1255   CALCIUM 8.9 04/10/2017 1046   CALCIUM 9.4 03/06/2017 1104   ALKPHOS 42 08/30/2020 1255   ALKPHOS 65 04/10/2017 1046   ALKPHOS 78 03/06/2017 1104   AST 13 (L) 08/30/2020 1255   AST 20 03/06/2017 1104   ALT 17 08/30/2020 1255   ALT 21 04/10/2017 1046   ALT 20 03/06/2017 1104   BILITOT 0.6 08/30/2020 1255   BILITOT 0.47 03/06/2017 1104       Impression and Plan:   Mr. Shall is a very pleasant 75yo caucasian gentleman with history of hemochromatosis, homozygous for the C282Y mutation which has not been a problem for him so far.   He did have melanoma.  This actually was of the left knee.  He did have some sentinel nodes were taken after the left groin.  This was back in 2018.  He now has recurrence in the left inguinal region.  He also has a left thigh nodule that was positive for melanoma.  He will continue his radiation therapy.  He says he finishes this up in early August.  Do think that he will need systemic therapy.  He really had a horrible time with Opdivo when we treated him for his initial melanoma.  I will have to see if we cannot try another immunotherapeutic agent.  I am just happy that he is doing okay.  Again we will going to have to get him to a lymphedema clinic.  I would like to see him back in about 4-5 weeks.  At that point time, we will then plan for systemic therapy for him.    [name], MD 7/7/20224:08  PM

## 2020-10-12 NOTE — Patient Instructions (Signed)

## 2020-10-13 ENCOUNTER — Telehealth: Payer: Self-pay | Admitting: *Deleted

## 2020-10-13 ENCOUNTER — Ambulatory Visit
Admission: RE | Admit: 2020-10-13 | Discharge: 2020-10-13 | Disposition: A | Discharge status: Home / Self Care | Payer: Medicare Other | Source: Ambulatory Visit | Attending: Radiation Oncology | Admitting: Radiation Oncology

## 2020-10-13 DIAGNOSIS — C4372 Malignant melanoma of left lower limb, including hip: Secondary | ICD-10-CM | POA: Diagnosis not present

## 2020-10-13 DIAGNOSIS — I890 Lymphedema, not elsewhere classified: Secondary | ICD-10-CM | POA: Diagnosis not present

## 2020-10-13 DIAGNOSIS — Z510 Encounter for antineoplastic radiation therapy: Secondary | ICD-10-CM | POA: Diagnosis not present

## 2020-10-13 LAB — IRON AND TIBC
Iron: 142 ug/dL (ref 42–163)
Saturation Ratios: 76 % — ABNORMAL HIGH (ref 20–55)
TIBC: 186 ug/dL — ABNORMAL LOW (ref 202–409)
UIBC: 45 ug/dL — ABNORMAL LOW (ref 117–376)

## 2020-10-13 LAB — FERRITIN: Ferritin: 49 ng/mL (ref 24–336)

## 2020-10-13 NOTE — Telephone Encounter (Signed)
Per 10/12/20 los called patient and gave upcoming appointment - confirmed

## 2020-10-16 ENCOUNTER — Ambulatory Visit
Admission: RE | Admit: 2020-10-16 | Discharge: 2020-10-16 | Disposition: A | Discharge status: Home / Self Care | Payer: Medicare Other | Source: Ambulatory Visit | Attending: Radiation Oncology | Admitting: Radiation Oncology

## 2020-10-16 ENCOUNTER — Other Ambulatory Visit: Payer: Self-pay

## 2020-10-16 DIAGNOSIS — C4372 Malignant melanoma of left lower limb, including hip: Secondary | ICD-10-CM | POA: Diagnosis not present

## 2020-10-16 DIAGNOSIS — Z510 Encounter for antineoplastic radiation therapy: Secondary | ICD-10-CM | POA: Diagnosis not present

## 2020-10-16 DIAGNOSIS — I890 Lymphedema, not elsewhere classified: Secondary | ICD-10-CM | POA: Diagnosis not present

## 2020-10-17 ENCOUNTER — Ambulatory Visit
Admission: RE | Admit: 2020-10-17 | Discharge: 2020-10-17 | Disposition: A | Discharge status: Home / Self Care | Payer: Medicare Other | Source: Ambulatory Visit | Attending: Radiation Oncology | Admitting: Radiation Oncology

## 2020-10-17 ENCOUNTER — Telehealth: Payer: Self-pay | Admitting: Internal Medicine

## 2020-10-17 DIAGNOSIS — Z510 Encounter for antineoplastic radiation therapy: Secondary | ICD-10-CM | POA: Diagnosis not present

## 2020-10-17 DIAGNOSIS — C4372 Malignant melanoma of left lower limb, including hip: Secondary | ICD-10-CM

## 2020-10-17 DIAGNOSIS — I890 Lymphedema, not elsewhere classified: Secondary | ICD-10-CM | POA: Diagnosis not present

## 2020-10-17 MED ORDER — SONAFINE EX EMUL
1.0000 "application " | Freq: Once | CUTANEOUS | Status: AC
  Administered 2020-10-17: 1 via TOPICAL

## 2020-10-17 NOTE — Progress Notes (Signed)
Pt here for patient teaching.    Pt given Radiation and You booklet, skin care instructions, and Sonafine.    Reviewed areas of pertinence such as diarrhea, fatigue, hair loss, nausea and vomiting, sexual and fertility changes, skin changes, and urinary and bladder changes .   Pt able to give teach back of to pat skin, use unscented/gentle soap, use baby wipes, have Imodium on hand, drink plenty of water, and sitz bath,apply Sonafine bid and avoid applying anything to skin within 4 hours of treatment.   Pt verbalizes understanding of information given and will contact nursing with any questions or concerns.    [email or web address]

## 2020-10-17 NOTE — Telephone Encounter (Signed)
Pls advise will need dx for each.Marland KitchenJohny [name]

## 2020-10-17 NOTE — Telephone Encounter (Signed)
   Spouse calling to request order for lift chair and scooter be faxed to [place] at [employer] [phone]

## 2020-10-18 ENCOUNTER — Other Ambulatory Visit: Payer: Self-pay

## 2020-10-18 ENCOUNTER — Ambulatory Visit
Admission: RE | Admit: 2020-10-18 | Discharge: 2020-10-18 | Disposition: A | Discharge status: Home / Self Care | Payer: Medicare Other | Source: Ambulatory Visit | Attending: Radiation Oncology | Admitting: Radiation Oncology

## 2020-10-18 DIAGNOSIS — C4372 Malignant melanoma of left lower limb, including hip: Secondary | ICD-10-CM | POA: Diagnosis not present

## 2020-10-18 DIAGNOSIS — Z510 Encounter for antineoplastic radiation therapy: Secondary | ICD-10-CM | POA: Diagnosis not present

## 2020-10-18 DIAGNOSIS — I890 Lymphedema, not elsewhere classified: Secondary | ICD-10-CM | POA: Diagnosis not present

## 2020-10-18 NOTE — Telephone Encounter (Signed)
   Patient called and said he is needing the order to be sent over before tomorrow morning. Informed patient that orders are still being worked on

## 2020-10-19 ENCOUNTER — Ambulatory Visit: Payer: Medicare Other

## 2020-10-19 ENCOUNTER — Ambulatory Visit
Admission: RE | Admit: 2020-10-19 | Discharge: 2020-10-19 | Disposition: A | Discharge status: Home / Self Care | Payer: Medicare Other | Source: Ambulatory Visit | Attending: Radiation Oncology | Admitting: Radiation Oncology

## 2020-10-19 DIAGNOSIS — I890 Lymphedema, not elsewhere classified: Secondary | ICD-10-CM | POA: Insufficient documentation

## 2020-10-19 DIAGNOSIS — Z510 Encounter for antineoplastic radiation therapy: Secondary | ICD-10-CM | POA: Diagnosis not present

## 2020-10-19 DIAGNOSIS — C4372 Malignant melanoma of left lower limb, including hip: Secondary | ICD-10-CM | POA: Diagnosis not present

## 2020-10-19 NOTE — Telephone Encounter (Signed)
MD provided script for Lift chair & Scooter was faxed manually to [phone]. He used dx Gait disorder, Melanoma Left Leg, Left leg edema w/ pain.Marland KitchenJohny Chess

## 2020-10-19 NOTE — Telephone Encounter (Signed)
Ok Thx 

## 2020-10-20 ENCOUNTER — Ambulatory Visit
Admission: RE | Admit: 2020-10-20 | Discharge: 2020-10-20 | Disposition: A | Discharge status: Home / Self Care | Payer: Medicare Other | Source: Ambulatory Visit | Attending: Radiation Oncology | Admitting: Radiation Oncology

## 2020-10-20 ENCOUNTER — Other Ambulatory Visit: Payer: Self-pay

## 2020-10-20 DIAGNOSIS — Z510 Encounter for antineoplastic radiation therapy: Secondary | ICD-10-CM | POA: Diagnosis not present

## 2020-10-20 DIAGNOSIS — C4372 Malignant melanoma of left lower limb, including hip: Secondary | ICD-10-CM | POA: Diagnosis not present

## 2020-10-20 DIAGNOSIS — I890 Lymphedema, not elsewhere classified: Secondary | ICD-10-CM | POA: Diagnosis not present

## 2020-10-23 ENCOUNTER — Ambulatory Visit
Admission: RE | Admit: 2020-10-23 | Discharge: 2020-10-23 | Disposition: A | Discharge status: Home / Self Care | Payer: Medicare Other | Source: Ambulatory Visit | Attending: Radiation Oncology | Admitting: Radiation Oncology

## 2020-10-23 DIAGNOSIS — Z510 Encounter for antineoplastic radiation therapy: Secondary | ICD-10-CM | POA: Diagnosis not present

## 2020-10-23 DIAGNOSIS — I890 Lymphedema, not elsewhere classified: Secondary | ICD-10-CM | POA: Diagnosis not present

## 2020-10-23 DIAGNOSIS — Z20828 Contact with and (suspected) exposure to other viral communicable diseases: Secondary | ICD-10-CM | POA: Diagnosis not present

## 2020-10-23 DIAGNOSIS — C4372 Malignant melanoma of left lower limb, including hip: Secondary | ICD-10-CM | POA: Diagnosis not present

## 2020-10-23 DIAGNOSIS — Z1159 Encounter for screening for other viral diseases: Secondary | ICD-10-CM | POA: Diagnosis not present

## 2020-10-24 ENCOUNTER — Ambulatory Visit
Admission: RE | Admit: 2020-10-24 | Discharge: 2020-10-24 | Disposition: A | Discharge status: Home / Self Care | Payer: Medicare Other | Source: Ambulatory Visit | Attending: Radiation Oncology | Admitting: Radiation Oncology

## 2020-10-24 DIAGNOSIS — I890 Lymphedema, not elsewhere classified: Secondary | ICD-10-CM | POA: Diagnosis not present

## 2020-10-24 DIAGNOSIS — C4372 Malignant melanoma of left lower limb, including hip: Secondary | ICD-10-CM | POA: Diagnosis not present

## 2020-10-24 DIAGNOSIS — Z510 Encounter for antineoplastic radiation therapy: Secondary | ICD-10-CM | POA: Diagnosis not present

## 2020-10-25 ENCOUNTER — Ambulatory Visit
Admission: RE | Admit: 2020-10-25 | Discharge: 2020-10-25 | Disposition: A | Discharge status: Home / Self Care | Payer: Medicare Other | Source: Ambulatory Visit | Attending: Radiation Oncology | Admitting: Radiation Oncology

## 2020-10-25 DIAGNOSIS — I890 Lymphedema, not elsewhere classified: Secondary | ICD-10-CM | POA: Diagnosis not present

## 2020-10-25 DIAGNOSIS — Z510 Encounter for antineoplastic radiation therapy: Secondary | ICD-10-CM | POA: Diagnosis not present

## 2020-10-25 DIAGNOSIS — C4372 Malignant melanoma of left lower limb, including hip: Secondary | ICD-10-CM | POA: Diagnosis not present

## 2020-10-26 ENCOUNTER — Encounter: Payer: Self-pay | Admitting: Rehabilitation

## 2020-10-26 ENCOUNTER — Ambulatory Visit: Payer: Medicare Other | Admitting: Rehabilitation

## 2020-10-26 ENCOUNTER — Ambulatory Visit
Admission: RE | Admit: 2020-10-26 | Discharge: 2020-10-26 | Disposition: A | Discharge status: Home / Self Care | Payer: Medicare Other | Source: Ambulatory Visit | Attending: Radiation Oncology | Admitting: Radiation Oncology

## 2020-10-26 ENCOUNTER — Other Ambulatory Visit: Payer: Self-pay

## 2020-10-26 DIAGNOSIS — C4372 Malignant melanoma of left lower limb, including hip: Secondary | ICD-10-CM | POA: Diagnosis not present

## 2020-10-26 DIAGNOSIS — I890 Lymphedema, not elsewhere classified: Secondary | ICD-10-CM

## 2020-10-26 DIAGNOSIS — Z510 Encounter for antineoplastic radiation therapy: Secondary | ICD-10-CM | POA: Diagnosis not present

## 2020-10-26 NOTE — Patient Instructions (Signed)
Pt will return next week with stockings that were ordered at the medical supply store to assess fit and tolerance

## 2020-10-26 NOTE — Therapy (Signed)
[place], [place], [address] Phone: [phone]   Fax:  [phone]  Physical Therapy Evaluation  Patient Details  Name: [name] MRN: [id number] Date of Birth: [birth year] Referring Provider (PT): Dr. Marin Olp   Encounter Date: 10/26/2020   PT End of Session - 10/26/20 1212     Visit Number 1    Number of Visits 4    Date for PT Re-Evaluation 11/23/20    PT Start Time 1100    PT Stop Time 1204    PT Time Calculation (min) 64 min    Activity Tolerance Patient tolerated treatment well    Behavior During Therapy [facility] for tasks assessed/performed             Past Medical History:  Diagnosis Date   Alcoholism /alcohol abuse    per family   Allergic rhinitis    Allergy    Anxiety    Bacterial infection    CAD (coronary artery disease)    minimal coronary plaque in the LAD and right coronary system. 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 7-06. Last nuclear stress 11-17-06 fixed anterior/ inferior defect, no inducible ischemia, EF 81%   Cataract    CHF (congestive heart failure) (HCC)    Chronic back pain    "all over back"   Chronic neck pain    COPD (chronic obstructive pulmonary disease) ([place])    Depression    Diverticulosis    DVT (deep venous thrombosis) ([place]) 06/2020   Dyspnea    with exertion   Dysrhythmia 01-24-12   past hx. A.Fib x1 episode-responded to med.   Falls frequently    "since 02/2013" (06/16/2013)   Family history of cancer    Genetic testing 03/06/2017   Multi-Cancer panel (83 genes) @ [facility] - No pathogenic mutations detected   GERD (gastroesophageal reflux disease)    Goals of care, counseling/discussion 10/12/2020   Hemochromatosis    dx'd 14 yrs ago last ferritin Aug 11, 08 52 (22-322), Fe 136 ("I had 250 phlebotomies for that")   High cholesterol    hx   Hx of colonic polyps    Hx of  colonoscopy    Hypertension    Iron deficiency anemia due to chronic blood loss 12/27/2016   Malignant melanoma of knee, left (HCC)    Myocardial infarction [facility]) 2006   "related to catheterization"   Narcotic abuse ([place])    per family   Neuropathy    fingers and toes   Osteoarthritis    Peripheral edema    Bilateral ankles   PONV (postoperative nausea and vomiting)    Pulmonary embolism ([place]) 06/2020   RA (rheumatoid arthritis) ([place])     Past Surgical History:  Procedure Laterality Date   ABDOMINAL ADHESION SURGERY  ~ 1968   ANKLE RECONSTRUCTION Right 6-09   [facility]   APPENDECTOMY  ~ [place] Left 03/27/2017   Procedure: ASPIRATION OF SEROMA;  Surgeon: Stark Klein, MD;  Location: [place];  Service: General;  Laterality: Left;   BONE TUMOR RESECTION  ~ 1954   "taken off my mastoid"   [place] Right 1990's   CATARACT EXTRACTION, BILATERAL Bilateral 01-24-12   CORONARY ANGIOPLASTY WITH STENT PLACEMENT  2006   "while repairing 1st stent, a second area tore and they had to place 2nd stent " ?LAD & CX   CORONARY ANGIOPLASTY WITH  STENT PLACEMENT  06/17/2014   CYST EXCISION  "several OR's"   "backX 2, back of my neck, face, inside right bicept, chest, wrist"   FOOT SURGERY Right 11-08   for removal of bone spurs-   HAMMER TOE SURGERY Right 07/2012   "broke 4 hammertoes"    HARDWARE REMOVAL  03/09/2012   Procedure: HARDWARE REMOVAL;  Surgeon: Nita Sells, MD;  Location: [place];  Service: Orthopedics;  Laterality: Left;  Hardware Removal from Left Shoulder   HARVEST BONE GRAFT  02/06/2012   Procedure: HARVEST ILIAC BONE GRAFT;  Surgeon: Nita Sells, MD;  Location: [facility];  Service: Orthopedics;;  bone marrow aspirqation    INGUINAL HERNIA REPAIR Bilateral    JOINT REPLACEMENT     KNEE SURGERY Left ~ 2003   "6-12 months after uni knee removed synovial sack"   LEFT HEART CATHETERIZATION WITH  CORONARY ANGIOGRAM N/A 06/17/2014   PCI of diffuse severe stenosis in the proximal to mid LAD using overlapping drug-eluting stents.   LUMBAR WOUND DEBRIDEMENT N/A 03/17/2013   Procedure: Incision and drainage of superficial lumbar wound;  Surgeon: Floyce Stakes, MD;  Location: [place] [facility];  Service: Neurosurgery;  Laterality: N/A;  Incision and drainage of superficial lumbar wound   LYMPH NODE BIOPSY N/A 08/15/2020   Procedure: EXCISIONAL LYMPH NODE BIOPSY LEFT INGUINAL REGION, EXCISION OF LEFT THIGH NODULE, EXCISION OF LEFT ABDOMINAL WALL LESION;  Surgeon: Stark Klein, MD;  Location: [facility];  Service: General;  Laterality: N/A;   MECKEL DIVERTICULUM EXCISION  ~ [place] LYMPH NODE BIOPSY Left 02/12/2017   WIDE LOCAL EXCISION LEFT KNEE MELANOMA, ADVANCEMENT FLAP CLOSURE, AND SENTINEL LYMPH NODE MAPPING AND BIOPSY.   MELANOMA EXCISION WITH SENTINEL LYMPH NODE BIOPSY Left 02/12/2017   Procedure: WIDE LOCAL EXCISION LEFT KNEE MELANOMA, ADVANCEMENT FLAP CLOSURE, AND SENTINEL LYMPH NODE MAPPING AND BIOPSY.;  Surgeon: Stark Klein, MD;  Location: [place];  Service: General;  Laterality: Left;  GENERAL AND LOCAL   ORIF SHOULDER FRACTURE  02/06/2012   Procedure: OPEN REDUCTION INTERNAL FIXATION (ORIF) SHOULDER FRACTURE;  Surgeon: Nita Sells, MD;  Location[facility];  Service: Orthopedics;  Laterality: Left;  ORIF of a Left Shoulder Fracture with  Iliac Crest Bone Graft aspiration    PORTACATH PLACEMENT Left 03/27/2017   Procedure: INSERTION PORT-A-CATH;  Surgeon: Stark Klein, MD;  Location: [place];  Service: General;  Laterality: Left;   POSTERIOR LUMBAR FUSION  12-10   L4-5 diskectomy w/ fusion, cage placement and rods; [place]   POSTERIOR LUMBAR FUSION 4 LEVEL N/A 03/02/2013   Procedure: Lumbar One to Sacral One Posterior lumbar interbody fusion;  Surgeon: Floyce Stakes, MD;  Location: [place] [facility];  Service: Neurosurgery;  Laterality:  N/A;  L1 to S1 Posterior lumbar interbody fusion   REPLACEMENT UNICONDYLAR JOINT KNEE Left ~ 2003   "~ 6 months after total knee replaced"   SHOULDER ARTHROSCOPY Left ~ 2004 X 2   "@ [facility]; left bone splinter in & had to clean it out"   TOTAL ANKLE REPLACEMENT Right 2008   at [place] Bilateral 2002   TOTAL SHOULDER REPLACEMENT Left 2006   TOTAL SHOULDER REPLACEMENT Right ~ 2007   Dr. Marlou Sa    There were no vitals filed for this visit.    Subjective Assessment - 10/26/20 1106     Subjective bil LE edema starting with cancer and maybe a bit  better with treatment - both knee high per patient    Pertinent History Stage IIIC melanoma of the left knee with SLNB in 2018, history of PE 08/03/20 and Lt LE DVT behind the knee x 3 pinhead on Eliquis, anemia, past therapy included chemotherapy and 3 lymph nodes for the left side.  Recent recurrent melanoma noted with recent excision of left thigh nodule and deep left inguinal lymph node on 08/15/20 by Dr. Barry Dienes in the left inguinal region with left thigh nodule.  Recurrent seroma trouble in the left inguinal area with drain placed but removed but still needing aspirations. Currently getting radiation of the left thigh and groin with 8 left.  Most likely more chemotherapy needed. OTHER: CAD, afib, high cholesterol, COPD, GERD, RA, lumbar fusion with 5 months in the hospital due to sepsis with pt reporting fusion needs to be repairing, right ankle replacement, bil shoulder repacement    Limitations Walking   more from back surgery complications   Patient Stated Goals What to do to control the swelling    Currently in Pain? Yes   I have pain everywhere usually 8/10 in the morning then down to 5/10               [facility] PT Assessment - 10/26/20 0001       Assessment   Medical Diagnosis metastatic melanom    Referring Provider (PT) Dr. Marin Olp    Onset Date/Surgical Date 04/08/16    Hand Dominance Right    Next MD Visit today     Prior Therapy no      Precautions   Precaution Comments active cancer*, fall, lymphedema bil LEs      Restrictions   Weight Bearing Restrictions No      Balance Screen   Has the patient fallen in the past 6 months No    Has the patient had a decrease in activity level because of a fear of falling?  No    Is the patient reluctant to leave their home because of a fear of falling?  No      Home Ecologist residence    Living Arrangements Spouse/significant other    Available Help at Discharge Family      Prior Function   Level of Independence Needs assistance with ADLs    Vocation Retired    Leisure It's just too painful    Comments Ever since the back surgery I have been bad off      Cognition   Overall Cognitive Status Within Functional Limits for tasks assessed      Observation/Other Assessments   Observations severe RA in the hands, abnormal gait due to back problems      Coordination   Gross Motor Movements are Fluid and Coordinated Not tested      Posture/Postural Control   Posture/Postural Control Postural limitations    Postural Limitations Rounded Shoulders;Forward head;Increased thoracic kyphosis      ROM / Strength   AROM / PROM / Strength AROM      AROM   Overall AROM Comments limited shoulder shoulder AROM Lt>Rt      Ambulation/Gait   Gait Comments able to walk 0.25 miles per patient and is getting an electric scooter               LYMPHEDEMA/ONCOLOGY QUESTIONNAIRE - 10/26/20 0001       Type   Cancer Type left metastatic melanoma      Surgeries  Other Surgery Date 08/18/20      Date Lymphedema/Swelling Started   Date 04/08/16      Treatment   Active Chemotherapy Treatment No    Past Chemotherapy Treatment Yes    Active Radiation Treatment Yes    Past Radiation Treatment No    Current Hormone Treatment No    Past Hormone Therapy No      What other symptoms do you have   Are you Having Heaviness or  Tightness Yes    Are you having Pain Yes    Other Symptoms soft pitting more CHF in nature with pt noting he does have trouble breathing when he elevates his legs      Lymphedema Assessments   Lymphedema Assessments Lower extremities      Right Lower Extremity Lymphedema   10 cm Proximal to Suprapatella 39.5 cm    At Midpatella/Popliteal Crease 38.3 cm    30 cm Proximal to Floor at Lateral Plantar Foot 39.1 cm    20 cm Proximal to Floor at Lateral Plantar Foot 31.8 1    10  cm Proximal to Floor at Lateral Malleoli 27.8 cm    5 cm Proximal to 1st MTP Joint 23.8 cm    Across MTP Joint 24.3 cm    Around Proximal Great Toe 9.1 cm    Other 39cm length    Other 5cm length      Left Lower Extremity Lymphedema   10 cm Proximal to Suprapatella 36.5 cm    At Midpatella/Popliteal Crease 40 cm    30 cm Proximal to Floor at Lateral Plantar Foot 31.2 cm    20 cm Proximal to Floor at Lateral Plantar Foot 30.5 cm    10 cm Proximal to Floor at Lateral Malleoli 27.3 cm    5 cm Proximal to 1st MTP Joint 25.5 cm    Across MTP Joint 24.3 cm    Around Proximal Great Toe 9.2 cm                     Objective measurements completed on examination: See above findings.               PT Education - 10/26/20 1211     Education Details POC, lymphedema vs CHF edema, stocking donning and donning aids    Person(s) Educated Patient    Methods Explanation;Demonstration;Verbal cues    Comprehension Verbalized understanding                 PT Long Term Goals - 10/26/20 1230       PT LONG TERM GOAL #1   Title Pt will obtain appropriate compression garments to manage LE edema    Time 4    Period Weeks    Status New      PT LONG TERM GOAL #2   Title Pt will be educated on skin care for LE lymphedema    Time 4    Period Weeks    Status New                    Plan - 10/26/20 1213     Clinical Impression Statement Pt presents with bil below knee lymphedema  that has been present since chemotherapy in 2018.  The left upper thigh is also slightly larger but is undergoing radiation in this location.  Pitting is soft and fluctuating more of a CHF/cardiac related lymphedema vs straightforward lymphedema and pt also notes increased breathing problems when his  LEs are elevated more than 10-27mnutes.  Due to cardiac symptoms currently POC will be for light compression which pt has already ordered through a medical supply store and no MLD at this time. Pt will bring in his new compression next visit to check compression and for education on skin care and further needs.  Pt is aware of how to donn the garments as he has had them in the past but not recently.    Personal Factors and Comorbidities Age;Comorbidity 3+    Comorbidities complicated medical history, active cancer    Examination-Activity Limitations Bathing;Lift;Stand;Bed Mobility;Locomotion Level;Bend;Reach Overhead;Carry;Sleep;Squat;Dressing;Stairs    Examination-Participation Restrictions Cleaning;Meal Prep;Yard Work;Community Activity;Occupation;Laundry;Shop    Stability/Clinical Decision Making Unstable/Unpredictable    Clinical Decision Making High    Rehab Potential Fair    PT Frequency 1x / week    PT Duration 4 weeks    PT Treatment/Interventions ADLs/Self Care Home Management;Compression bandaging;Patient/family education;Therapeutic exercise;DME Instruction    PT Next Visit Plan check fit of new garments - ok vs anything new? caution pt about not wearing at night and cardiac symptoms, educate on skin care    Consulted and Agree with Plan of Care Patient             Patient will benefit from skilled therapeutic intervention in order to improve the following deficits and impairments:  Increased edema  Visit Diagnosis: Lymphedema, not elsewhere classified     Problem List Patient Active Problem List   Diagnosis Date Noted   Goals of care, counseling/discussion 10/12/2020    Atherosclerosis of aorta ([facility] 07/24/2020   Symptomatic anemia 05/27/2020   Pulmonary embolus ([facility] 05/27/2020   Acute respiratory failure with hypoxia ([facility] 05/26/2020   Callus of foot 04/21/2018   Edema 03/27/2018   Toxic gastroenteritis and colitis 03/25/2018   Other ulcerative 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 ([facility] 196/28/3662  Diarrhea 12/23/2017   Nausea & vomiting 12/23/2017   Herpes zoster 07/07/2017   Constipation 04/21/2017   Genetic testing 03/06/2017   Family history of cancer    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[facility] 02/12/2017   Iron deficiency anemia due to chronic blood loss 12/27/2016   Scalp wound 06/13/2016   Scalp laceration, subsequent encounter 06/04/2016   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   Pain in joint, ankle and foot 09/28/2015   Anemia due to chronic blood loss 09/28/2015   Hyponatremia 09/28/2015   Cramp in limb 09/28/2015   Exertional angina ([facility] 06/17/2014   DOE (dyspnea on exertion) 04/28/2014   Hypokalemia 03/11/2014   Counseling regarding goals of care 12/31/2013   Impaired glucose tolerance 12/31/2013   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[facility] 11/18/2013   Fever 11/18/2013   Leukocytosis 10/30/2013   Fatigue 10/22/2013   Hoarseness of voice 08/10/2013   Sleep disorder 07/05/2013   Tension headache 07/01/2013   Polypharmacy 06/19/2013   Lumbar post-laminectomy syndrome 05/27/2013   Degenerative arthritis of spine 05/26/2013   Hemochromatosis 05/26/2013   Wound infection after surgery 05/26/2013   Normocytic anemia 05/26/2013   Unintentional weight loss 05/26/2013   Protein-calorie malnutrition, severe ([facility] 05/25/2013   Chronic pain syndrome 05/23/2013   COPD (chronic obstructive pulmonary disease) ([facility] 05/06/2013   Frequent falls 05/06/2013   Encounter for postoperative wound care 05/06/2013   Atrial fibrillation ([facility] 08/07/2010   Rheumatoid arthritis ([facility] 10/18/2008   Anxiety 11/09/2007   Elevated lipids 03/30/2007   PSEUDOGOUT  03/30/2007   Essential hypertension 03/30/2007   Coronary atherosclerosis 03/30/2007    TStark Bray7/21/2022, 12:31 PM  [place], [place], [address] Phone: [phone]   Fax:  [phone]  Name: JAREN VANETTEN MRN: [id number] Date of Birth: 1945/09/08

## 2020-10-27 ENCOUNTER — Ambulatory Visit
Admission: RE | Admit: 2020-10-27 | Discharge: 2020-10-27 | Disposition: A | Discharge status: Home / Self Care | Payer: Medicare Other | Source: Ambulatory Visit | Attending: Radiation Oncology | Admitting: Radiation Oncology

## 2020-10-27 DIAGNOSIS — C4372 Malignant melanoma of left lower limb, including hip: Secondary | ICD-10-CM | POA: Diagnosis not present

## 2020-10-27 DIAGNOSIS — I890 Lymphedema, not elsewhere classified: Secondary | ICD-10-CM | POA: Diagnosis not present

## 2020-10-27 DIAGNOSIS — Z510 Encounter for antineoplastic radiation therapy: Secondary | ICD-10-CM | POA: Diagnosis not present

## 2020-10-30 ENCOUNTER — Other Ambulatory Visit: Payer: Self-pay

## 2020-10-30 ENCOUNTER — Ambulatory Visit
Admission: RE | Admit: 2020-10-30 | Discharge: 2020-10-30 | Disposition: A | Discharge status: Home / Self Care | Payer: Medicare Other | Source: Ambulatory Visit | Attending: Radiation Oncology | Admitting: Radiation Oncology

## 2020-10-30 ENCOUNTER — Telehealth: Payer: Self-pay | Admitting: Internal Medicine

## 2020-10-30 DIAGNOSIS — R296 Repeated falls: Secondary | ICD-10-CM

## 2020-10-30 DIAGNOSIS — Z510 Encounter for antineoplastic radiation therapy: Secondary | ICD-10-CM | POA: Diagnosis not present

## 2020-10-30 DIAGNOSIS — R609 Edema, unspecified: Secondary | ICD-10-CM

## 2020-10-30 DIAGNOSIS — C799 Secondary malignant neoplasm of unspecified site: Secondary | ICD-10-CM

## 2020-10-30 DIAGNOSIS — M961 Postlaminectomy syndrome, not elsewhere classified: Secondary | ICD-10-CM

## 2020-10-30 DIAGNOSIS — C439 Malignant melanoma of skin, unspecified: Secondary | ICD-10-CM

## 2020-10-30 DIAGNOSIS — I890 Lymphedema, not elsewhere classified: Secondary | ICD-10-CM | POA: Diagnosis not present

## 2020-10-30 DIAGNOSIS — C4372 Malignant melanoma of left lower limb, including hip: Secondary | ICD-10-CM | POA: Diagnosis not present

## 2020-10-30 NOTE — Telephone Encounter (Signed)
Requesting an written order for a four wheel electric cart, stand up chair, and compression. He was recently diagnosed with cancer again and is needing these things.  Written order can be faxed to [phone].   Please advise.

## 2020-10-31 ENCOUNTER — Ambulatory Visit
Admission: RE | Admit: 2020-10-31 | Discharge: 2020-10-31 | Disposition: A | Discharge status: Home / Self Care | Payer: Medicare Other | Source: Ambulatory Visit | Attending: Radiation Oncology | Admitting: Radiation Oncology

## 2020-10-31 DIAGNOSIS — I890 Lymphedema, not elsewhere classified: Secondary | ICD-10-CM | POA: Diagnosis not present

## 2020-10-31 DIAGNOSIS — Z510 Encounter for antineoplastic radiation therapy: Secondary | ICD-10-CM | POA: Diagnosis not present

## 2020-10-31 DIAGNOSIS — C4372 Malignant melanoma of left lower limb, including hip: Secondary | ICD-10-CM | POA: Diagnosis not present

## 2020-10-31 NOTE — Telephone Encounter (Signed)
Is it compression socks or something else?  Thanks

## 2020-11-01 ENCOUNTER — Ambulatory Visit
Admission: RE | Admit: 2020-11-01 | Discharge: 2020-11-01 | Disposition: A | Discharge status: Home / Self Care | Payer: Medicare Other | Source: Ambulatory Visit | Attending: Radiation Oncology | Admitting: Radiation Oncology

## 2020-11-01 ENCOUNTER — Other Ambulatory Visit: Payer: Self-pay

## 2020-11-01 DIAGNOSIS — C4372 Malignant melanoma of left lower limb, including hip: Secondary | ICD-10-CM | POA: Diagnosis not present

## 2020-11-01 DIAGNOSIS — Z510 Encounter for antineoplastic radiation therapy: Secondary | ICD-10-CM | POA: Diagnosis not present

## 2020-11-01 DIAGNOSIS — I890 Lymphedema, not elsewhere classified: Secondary | ICD-10-CM | POA: Diagnosis not present

## 2020-11-01 NOTE — Telephone Encounter (Signed)
Called pt to clarify msg.. Pt states he is needing Compression Stocking! Will need diagnosis code for the scripts. Also pt is wanting to know if he can be added on tomorrow for an [employer]. He states there's something going on w/ his foot. He states they look like bubbles under his feet, painful to walk.Marland KitchenJohny Chess

## 2020-11-02 ENCOUNTER — Telehealth (INDEPENDENT_AMBULATORY_CARE_PROVIDER_SITE_OTHER): Payer: Medicare Other | Admitting: Internal Medicine

## 2020-11-02 ENCOUNTER — Encounter: Payer: Self-pay | Admitting: Internal Medicine

## 2020-11-02 ENCOUNTER — Ambulatory Visit
Admission: RE | Admit: 2020-11-02 | Discharge: 2020-11-02 | Disposition: A | Discharge status: Home / Self Care | Payer: Medicare Other | Source: Ambulatory Visit | Attending: Radiation Oncology | Admitting: Radiation Oncology

## 2020-11-02 DIAGNOSIS — I890 Lymphedema, not elsewhere classified: Secondary | ICD-10-CM | POA: Diagnosis not present

## 2020-11-02 DIAGNOSIS — Z510 Encounter for antineoplastic radiation therapy: Secondary | ICD-10-CM | POA: Diagnosis not present

## 2020-11-02 DIAGNOSIS — R609 Edema, unspecified: Secondary | ICD-10-CM | POA: Diagnosis not present

## 2020-11-02 DIAGNOSIS — C4372 Malignant melanoma of left lower limb, including hip: Secondary | ICD-10-CM | POA: Diagnosis not present

## 2020-11-02 DIAGNOSIS — M4720 Other spondylosis with radiculopathy, site unspecified: Secondary | ICD-10-CM

## 2020-11-02 DIAGNOSIS — R238 Other skin changes: Secondary | ICD-10-CM | POA: Diagnosis not present

## 2020-11-02 MED ORDER — TORSEMIDE 100 MG PO TABS: 50.0000 mg | ORAL_TABLET | Freq: Every day | ORAL | 1 refills | Status: DC

## 2020-11-02 MED ORDER — GABAPENTIN 300 MG PO CAPS: 600.0000 mg | ORAL_CAPSULE | Freq: Every day | ORAL | 3 refills | Status: AC

## 2020-11-02 MED ORDER — MUPIROCIN 2 % EX OINT: TOPICAL_OINTMENT | CUTANEOUS | 1 refills | Status: AC

## 2020-11-02 NOTE — Telephone Encounter (Signed)
Okay.  Thanks.

## 2020-11-02 NOTE — Telephone Encounter (Signed)
Pt return call made appt for this afternoon for virtual. Faxed over DME orders to [facility]

## 2020-11-02 NOTE — Telephone Encounter (Signed)
Tried calling pt to add on video vist ffor this afternoon.. there was no answer & can't leave msg due to vm being full.John KitchenJohny Chess

## 2020-11-02 NOTE — Progress Notes (Signed)
Virtual Visit via Telephone Note  I connected with [name] on 11/02/20 at  4:30 PM EDT by telephone and verified that I am speaking with the correct person using two identifiers.  Location: Patient: Home Provider: [facility] office No one else is present at the visit  I discussed the limitations, risks, security and privacy concerns of performing an evaluation and management service by telephone and the availability of in person appointments. I also discussed with the patient that there may be a patient responsible charge related to this service. The patient expressed understanding and agreed to proceed.   History of Present Illness:  C/o blood blister on L calf C/o clear blisters on R hoot Complaining of severe swelling of both feet Follow-up on mild chronic back pain and gait difficulties-we need to provide several items of medical equipment prescription for the patient.   Observations/Objective:  The patient sounds normal on the phone Assessment and Plan:  See assessment and plan Follow Up Instructions:    I discussed the assessment and treatment plan with the patient. The patient was provided an opportunity to ask questions and all were answered. The patient agreed with the plan and demonstrated an understanding of the instructions.   The patient was advised to call back or seek an in-person evaluation if the symptoms worsen or if the condition fails to improve as anticipated.  I provided 22 minutes of non-face-to-face time during this encounter.   [name], MD

## 2020-11-03 ENCOUNTER — Ambulatory Visit: Payer: Medicare Other | Admitting: Rehabilitation

## 2020-11-03 ENCOUNTER — Other Ambulatory Visit: Payer: Self-pay

## 2020-11-03 ENCOUNTER — Encounter: Payer: Self-pay | Admitting: Rehabilitation

## 2020-11-03 ENCOUNTER — Ambulatory Visit
Admission: RE | Admit: 2020-11-03 | Discharge: 2020-11-03 | Disposition: A | Discharge status: Home / Self Care | Payer: Medicare Other | Source: Ambulatory Visit | Attending: Radiation Oncology | Admitting: Radiation Oncology

## 2020-11-03 DIAGNOSIS — C4372 Malignant melanoma of left lower limb, including hip: Secondary | ICD-10-CM | POA: Diagnosis not present

## 2020-11-03 DIAGNOSIS — Z510 Encounter for antineoplastic radiation therapy: Secondary | ICD-10-CM | POA: Diagnosis not present

## 2020-11-03 DIAGNOSIS — I890 Lymphedema, not elsewhere classified: Secondary | ICD-10-CM | POA: Diagnosis not present

## 2020-11-03 NOTE — Therapy (Signed)
[place], [place], [address] Phone: [phone]   Fax:  [phone]  Physical Therapy Treatment  Patient Details  Name: [name] MRN: [id number] Date of Birth: 16-Jan-1946 Referring Provider (PT): Dr. Marin Olp   Encounter Date: 11/03/2020   PT End of Session - 11/03/20 1237     Visit Number 2    Number of Visits 4    Date for PT Re-Evaluation 11/23/20    PT Start Time 1203    PT Stop Time 1223    PT Time Calculation (min) 20 min    Activity Tolerance Patient tolerated treatment well    Behavior During Therapy [facility] for tasks assessed/performed             Past Medical History:  Diagnosis Date   Alcoholism /alcohol abuse    per family   Allergic rhinitis    Allergy    Anxiety    Bacterial infection    CAD (coronary artery disease)    minimal coronary plaque in the LAD and right coronary system. 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 7-06. Last nuclear stress 11-17-06 fixed anterior/ inferior defect, no inducible ischemia, EF 81%   Cataract    CHF (congestive heart failure) (HCC)    Chronic back pain    "all over back"   Chronic neck pain    COPD (chronic obstructive pulmonary disease) ([place])    Depression    Diverticulosis    DVT (deep venous thrombosis) ([place]) 06/2020   Dyspnea    with exertion   Dysrhythmia 01-24-12   past hx. A.Fib x1 episode-responded to med.   Falls frequently    "since 02/2013" (06/16/2013)   Family history of cancer    Genetic testing 03/06/2017   Multi-Cancer panel (83 genes) @ [facility] - No pathogenic mutations detected   GERD (gastroesophageal reflux disease)    Goals of care, counseling/discussion 10/12/2020   Hemochromatosis    dx'd 14 yrs ago last ferritin Aug 11, 08 52 (22-322), Fe 136 ("I had 250 phlebotomies for that")   High cholesterol    hx   Hx of colonic polyps    Hx of  colonoscopy    Hypertension    Iron deficiency anemia due to chronic blood loss 12/27/2016   Malignant melanoma of knee, left (HCC)    Myocardial infarction [facility]) 2006   "related to catheterization"   Narcotic abuse ([place])    per family   Neuropathy    fingers and toes   Osteoarthritis    Peripheral edema    Bilateral ankles   PONV (postoperative nausea and vomiting)    Pulmonary embolism ([place]) 06/2020   RA (rheumatoid arthritis) ([place])     Past Surgical History:  Procedure Laterality Date   ABDOMINAL ADHESION SURGERY  ~ 1968   ANKLE RECONSTRUCTION Right 6-09   [facility]   APPENDECTOMY  ~ [place] Left 03/27/2017   Procedure: ASPIRATION OF SEROMA;  Surgeon: Stark Klein, MD;  Location: [place];  Service: General;  Laterality: Left;   BONE TUMOR RESECTION  ~ 1954   "taken off my mastoid"   [place] Right 1990's   CATARACT EXTRACTION, BILATERAL Bilateral 01-24-12   CORONARY ANGIOPLASTY WITH STENT PLACEMENT  2006   "while repairing 1st stent, a second area tore and they had to place 2nd stent " ?LAD & CX   CORONARY ANGIOPLASTY WITH  STENT PLACEMENT  06/17/2014   CYST EXCISION  "several OR's"   "backX 2, back of my neck, face, inside right bicept, chest, wrist"   FOOT SURGERY Right 11-08   for removal of bone spurs-   HAMMER TOE SURGERY Right 07/2012   "broke 4 hammertoes"    HARDWARE REMOVAL  03/09/2012   Procedure: HARDWARE REMOVAL;  Surgeon: Nita Sells, MD;  Location: [place];  Service: Orthopedics;  Laterality: Left;  Hardware Removal from Left Shoulder   HARVEST BONE GRAFT  02/06/2012   Procedure: HARVEST ILIAC BONE GRAFT;  Surgeon: Nita Sells, MD;  Location[facility];  Service: Orthopedics;;  bone marrow aspirqation    INGUINAL HERNIA REPAIR Bilateral    JOINT REPLACEMENT     KNEE SURGERY Left ~ 2003   "6-12 months after uni knee removed synovial sack"   LEFT HEART CATHETERIZATION WITH  CORONARY ANGIOGRAM N/A 06/17/2014   PCI of diffuse severe stenosis in the proximal to mid LAD using overlapping drug-eluting stents.   LUMBAR WOUND DEBRIDEMENT N/A 03/17/2013   Procedure: Incision and drainage of superficial lumbar wound;  Surgeon: Floyce Stakes, MD;  Location: [place] [facility];  Service: Neurosurgery;  Laterality: N/A;  Incision and drainage of superficial lumbar wound   LYMPH NODE BIOPSY N/A 08/15/2020   Procedure: EXCISIONAL LYMPH NODE BIOPSY LEFT INGUINAL REGION, EXCISION OF LEFT THIGH NODULE, EXCISION OF LEFT ABDOMINAL WALL LESION;  Surgeon: Stark Klein, MD;  [facility];  Service: General;  Laterality: N/A;   MECKEL DIVERTICULUM EXCISION  ~ [place] LYMPH NODE BIOPSY Left 02/12/2017   WIDE LOCAL EXCISION LEFT KNEE MELANOMA, ADVANCEMENT FLAP CLOSURE, AND SENTINEL LYMPH NODE MAPPING AND BIOPSY.   MELANOMA EXCISION WITH SENTINEL LYMPH NODE BIOPSY Left 02/12/2017   Procedure: WIDE LOCAL EXCISION LEFT KNEE MELANOMA, ADVANCEMENT FLAP CLOSURE, AND SENTINEL LYMPH NODE MAPPING AND BIOPSY.;  Surgeon: Stark Klein, MD;  Location: [place];  Service: General;  Laterality: Left;  GENERAL AND LOCAL   ORIF SHOULDER FRACTURE  02/06/2012   Procedure: OPEN REDUCTION INTERNAL FIXATION (ORIF) SHOULDER FRACTURE;  Surgeon: Nita Sells, MD;  [facility];  Service: Orthopedics;  Laterality: Left;  ORIF of a Left Shoulder Fracture with  Iliac Crest Bone Graft aspiration    PORTACATH PLACEMENT Left 03/27/2017   Procedure: INSERTION PORT-A-CATH;  Surgeon: Stark Klein, MD;  Location: [place];  Service: General;  Laterality: Left;   POSTERIOR LUMBAR FUSION  12-10   L4-5 diskectomy w/ fusion, cage placement and rods; [place]   POSTERIOR LUMBAR FUSION 4 LEVEL N/A 03/02/2013   Procedure: Lumbar One to Sacral One Posterior lumbar interbody fusion;  Surgeon: Floyce Stakes, MD;  Location: [place] [facility];  Service: Neurosurgery;  Laterality:  N/A;  L1 to S1 Posterior lumbar interbody fusion   REPLACEMENT UNICONDYLAR JOINT KNEE Left ~ 2003   "~ 6 months after total knee replaced"   SHOULDER ARTHROSCOPY Left ~ 2004 X 2   "@ [facility]; left bone splinter in & had to clean it out"   TOTAL ANKLE REPLACEMENT Right 2008   at [facility] Bilateral 2002   TOTAL SHOULDER REPLACEMENT Left 2006   TOTAL SHOULDER REPLACEMENT Right ~ 2007   Dr. Marlou Sa    There were no vitals filed for this visit.   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  Recent recurrent melanoma noted with recent excision of left thigh nodule and deep left inguinal lymph node on 08/15/20 by Dr. Barry Dienes in the left inguinal region with left thigh nodule.  Recurrent seroma trouble in the left inguinal area with drain placed but removed but still needing aspirations. Currently getting radiation of the left thigh and groin with 8 left.  Most likely more chemotherapy needed. OTHER: CAD, afib, high cholesterol, COPD, GERD, RA, lumbar fusion with 5 months in the hospital due to sepsis with pt reporting fusion needs to be repairing, right ankle replacement, bil shoulder repacement    Patient Stated Goals What to do to control the swelling    Currently in Pain? Yes    Pain Score 6     Pain Location Leg    Pain Orientation Left    Pain Descriptors / Indicators Aching    Pain Type Acute pain    Pain Onset Yesterday    Pain Frequency Constant                               [facility] Adult PT Treatment/Exercise - 11/03/20 0001       Self-Care   Self-Care Other Self-Care Comments     Other Self-Care Comments  Pt with left leg wrapped with telfa pad x 2 and gauze with blood almost seeping through the leg.  Pt was instructed by his MD to remove the blister skin and pt was requesting help with this but PT let him know that we are unable to do this.  Pt was educated on really watching for infection, marking redness and taking a picture.  Pt goes in for radiation today so I advised they take a look and advice on wound care.  Pt was given antibiotics to apply.  Discussed CHF signs and symptoms and encouraged pt to reach out to his MD with changes despite seeing him 1 month ago.  Pt agreeable.  Pt will call back if needed or he may benefit from wound care depending on how the blister heals.                         PT Long Term Goals - 10/26/20 1230       PT LONG TERM GOAL #1   Title Pt will obtain appropriate compression garments to manage LE edema    Time 4    Period Weeks    Status New      PT LONG TERM GOAL #2   Title Pt will be educated on skin care for LE lymphedema    Time 4    Period Weeks    Status New                   Plan - 11/03/20 1237     Clinical Impression Statement Pt has not received stockings and has a new complication of the left calf blood bilster with large amounts of drainage.  Discussed wound care, watching for infections, talking to radiation, and with his cardiologist about worsening SOB.  Pt  was also reminded that if he applies a compression stocking to the left LE to watch for symptoms and immediately remove.    PT Frequency 1x / week    PT Duration 4 weeks    PT Treatment/Interventions ADLs/Self Care Home Management;Compression bandaging;Patient/family education;Therapeutic exercise;DME Instruction    PT Next Visit Plan how is wound? wound care? check fit of new garments - ok vs anything new? caution pt about not wearing at night and cardiac symptoms, educate on skin care    Consulted and Agree with Plan of Care Patient              Patient will benefit from skilled therapeutic intervention in order to improve the following deficits and impairments:     Visit Diagnosis: Lymphedema, not elsewhere classified     Problem List Patient Active Problem List   Diagnosis Date Noted   Goals of care, counseling/discussion 10/12/2020   Atherosclerosis of aorta ([place]) 07/24/2020   Symptomatic anemia 05/27/2020   Pulmonary embolus ([place]) 05/27/2020   Acute respiratory failure with hypoxia ([place]) 05/26/2020   Callus of foot 04/21/2018   Edema 03/27/2018   Toxic gastroenteritis and colitis 03/25/2018   Other ulcerative 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 ([place]) 49/67/5916   Diarrhea 12/23/2017   Nausea & vomiting 12/23/2017   Herpes zoster 07/07/2017   Constipation 04/21/2017   Genetic testing 03/06/2017   Family history of cancer    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[facility]) 02/12/2017   Iron deficiency anemia due to chronic blood loss 12/27/2016   Scalp wound 06/13/2016   Scalp laceration, subsequent encounter 06/04/2016   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   Pain in joint, ankle and foot 09/28/2015   Anemia due to chronic blood loss 09/28/2015   Hyponatremia 09/28/2015   Cramp in limb 09/28/2015   Exertional angina (HCC) 06/17/2014   DOE (dyspnea on exertion) 04/28/2014   Hypokalemia 03/11/2014   Counseling regarding goals of care 12/31/2013   Impaired glucose tolerance 12/31/2013   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[place]) 11/18/2013   Fever 11/18/2013   Leukocytosis 10/30/2013   Fatigue 10/22/2013   Hoarseness of voice 08/10/2013   Sleep disorder 07/05/2013   Tension headache 07/01/2013   Polypharmacy 06/19/2013   Lumbar post-laminectomy syndrome 05/27/2013   Degenerative arthritis of spine 05/26/2013   Hemochromatosis 05/26/2013   Wound infection after surgery 05/26/2013   Normocytic anemia 05/26/2013   Unintentional weight loss 05/26/2013   Protein-calorie malnutrition, severe ([place]) 05/25/2013    Chronic pain syndrome 05/23/2013   COPD (chronic obstructive pulmonary disease) ([place]) 05/06/2013   Frequent falls 05/06/2013   Encounter for postoperative wound care 05/06/2013   Atrial fibrillation ([place]) 08/07/2010   Rheumatoid arthritis ([place]) 10/18/2008   Anxiety 11/09/2007   Elevated lipids 03/30/2007   PSEUDOGOUT 03/30/2007   Essential hypertension 03/30/2007   Coronary atherosclerosis 03/30/2007    Stark Bray 11/03/2020, 12:40 PM  [place], [place], [address] Phone: [phone]   Fax:  [phone]  Name: [name] MRN: [id number] Date of Birth: January 30, 1946

## 2020-11-05 DIAGNOSIS — R238 Other skin changes: Secondary | ICD-10-CM | POA: Insufficient documentation

## 2020-11-05 NOTE — Assessment & Plan Note (Signed)
worsening edema with blistering of the lower extremities due to severe edema/fluid retention.  Left lower extremity is worse due to lympedema.  Elevate legs.  Will increase torsemide.

## 2020-11-05 NOTE — Assessment & Plan Note (Signed)
Chronic pain.  Not better.  Continue with current pain management-hydromorphone.  Potential benefits of a long term opioids use as well as potential risks (i.e. addiction risk, apnea etc) and complications (i.e. Somnolence, constipation and others) were explained to the patient and were aknowledged.

## 2020-11-05 NOTE — Assessment & Plan Note (Signed)
New.  Blistering of the lower extremities due to severe edema/fluid retention.  Left lower extremity is worse due to lympedema.  Elevate legs.  Will increase torsemide.

## 2020-11-06 ENCOUNTER — Other Ambulatory Visit: Payer: Self-pay | Admitting: Internal Medicine

## 2020-11-06 ENCOUNTER — Telehealth: Payer: Self-pay | Admitting: *Deleted

## 2020-11-06 ENCOUNTER — Ambulatory Visit
Admission: RE | Admit: 2020-11-06 | Discharge: 2020-11-06 | Disposition: A | Discharge status: Home / Self Care | Payer: Medicare Other | Source: Ambulatory Visit | Attending: Radiation Oncology | Admitting: Radiation Oncology

## 2020-11-06 ENCOUNTER — Encounter: Payer: Self-pay | Admitting: Radiation Oncology

## 2020-11-06 ENCOUNTER — Other Ambulatory Visit: Payer: Self-pay

## 2020-11-06 DIAGNOSIS — Z510 Encounter for antineoplastic radiation therapy: Secondary | ICD-10-CM | POA: Diagnosis not present

## 2020-11-06 DIAGNOSIS — Z923 Personal history of irradiation: Secondary | ICD-10-CM

## 2020-11-06 DIAGNOSIS — C4372 Malignant melanoma of left lower limb, including hip: Secondary | ICD-10-CM | POA: Insufficient documentation

## 2020-11-06 HISTORY — DX: Personal history of irradiation: Z92.3

## 2020-11-06 NOTE — Telephone Encounter (Signed)
Returned patient's phone call regarding a cut on his leg. Patient stated,"My PCP is out of town for the week. I didn't know if Dr. Marin Olp needed to look at it or not. Where I live, there is a nurse who can look at it and help me wrap it." I instructed him to let the nurse look at it and help wrap it. He verbalized understanding.

## 2020-11-06 NOTE — Telephone Encounter (Signed)
   Please refax orders  to [phone], attn: Renee at [facility] med supply

## 2020-11-06 NOTE — Telephone Encounter (Signed)
Faxed orders to [facility] @ # given below.Marland KitchenJohny Chess

## 2020-11-13 DIAGNOSIS — Z23 Encounter for immunization: Secondary | ICD-10-CM | POA: Diagnosis not present

## 2020-11-15 ENCOUNTER — Inpatient Hospital Stay: Payer: Medicare Other | Attending: Hematology & Oncology

## 2020-11-15 ENCOUNTER — Inpatient Hospital Stay (HOSPITAL_BASED_OUTPATIENT_CLINIC_OR_DEPARTMENT_OTHER): Payer: Medicare Other | Admitting: Hematology & Oncology

## 2020-11-15 ENCOUNTER — Inpatient Hospital Stay: Payer: Medicare Other

## 2020-11-15 ENCOUNTER — Encounter: Payer: Self-pay | Admitting: Hematology & Oncology

## 2020-11-15 ENCOUNTER — Other Ambulatory Visit: Payer: Self-pay

## 2020-11-15 VITALS — BP 147/87 | HR 67 | Temp 98.3°F | Resp 18 | Ht 66.0 in | Wt 147.0 lb

## 2020-11-15 DIAGNOSIS — Z7901 Long term (current) use of anticoagulants: Secondary | ICD-10-CM | POA: Insufficient documentation

## 2020-11-15 DIAGNOSIS — C4372 Malignant melanoma of left lower limb, including hip: Secondary | ICD-10-CM

## 2020-11-15 DIAGNOSIS — Z86711 Personal history of pulmonary embolism: Secondary | ICD-10-CM | POA: Diagnosis not present

## 2020-11-15 DIAGNOSIS — D509 Iron deficiency anemia, unspecified: Secondary | ICD-10-CM | POA: Insufficient documentation

## 2020-11-15 DIAGNOSIS — Z923 Personal history of irradiation: Secondary | ICD-10-CM | POA: Insufficient documentation

## 2020-11-15 DIAGNOSIS — Z95828 Presence of other vascular implants and grafts: Secondary | ICD-10-CM

## 2020-11-15 DIAGNOSIS — Z79899 Other long term (current) drug therapy: Secondary | ICD-10-CM | POA: Insufficient documentation

## 2020-11-15 LAB — CBC WITH DIFFERENTIAL (CANCER CENTER ONLY)
Abs Immature Granulocytes: 0.07 10*3/uL (ref 0.00–0.07)
Basophils Absolute: 0 10*3/uL (ref 0.0–0.1)
Basophils Relative: 0 %
Eosinophils Absolute: 0 10*3/uL (ref 0.0–0.5)
Eosinophils Relative: 0 %
HCT: 37.5 % — ABNORMAL LOW (ref 39.0–52.0)
Hemoglobin: 12.1 g/dL — ABNORMAL LOW (ref 13.0–17.0)
Immature Granulocytes: 1 %
Lymphocytes Relative: 7 %
Lymphs Abs: 0.6 10*3/uL — ABNORMAL LOW (ref 0.7–4.0)
MCH: 30.7 pg (ref 26.0–34.0)
MCHC: 32.3 g/dL (ref 30.0–36.0)
MCV: 95.2 fL (ref 80.0–100.0)
Monocytes Absolute: 0.6 10*3/uL (ref 0.1–1.0)
Monocytes Relative: 7 %
Neutro Abs: 8.4 10*3/uL — ABNORMAL HIGH (ref 1.7–7.7)
Neutrophils Relative %: 85 %
Platelet Count: 182 10*3/uL (ref 150–400)
RBC: 3.94 MIL/uL — ABNORMAL LOW (ref 4.22–5.81)
RDW: 14 % (ref 11.5–15.5)
WBC Count: 9.7 10*3/uL (ref 4.0–10.5)
nRBC: 0 % (ref 0.0–0.2)

## 2020-11-15 LAB — CMP (CANCER CENTER ONLY)
ALT: 11 U/L (ref 0–44)
AST: 14 U/L — ABNORMAL LOW (ref 15–41)
Albumin: 3.6 g/dL (ref 3.5–5.0)
Alkaline Phosphatase: 52 U/L (ref 38–126)
Anion gap: 8 (ref 5–15)
BUN: 23 mg/dL (ref 8–23)
CO2: 36 mmol/L — ABNORMAL HIGH (ref 22–32)
Calcium: 8.7 mg/dL — ABNORMAL LOW (ref 8.9–10.3)
Chloride: 91 mmol/L — ABNORMAL LOW (ref 98–111)
Creatinine: 1.19 mg/dL (ref 0.61–1.24)
GFR, Estimated: >60 mL/min (ref >60)
Glucose, Bld: 94 mg/dL (ref 70–99)
Potassium: 3.5 mmol/L (ref 3.5–5.1)
Sodium: 135 mmol/L (ref 135–145)
Total Bilirubin: 0.6 mg/dL (ref 0.3–1.2)
Total Protein: 5.9 g/dL — ABNORMAL LOW (ref 6.5–8.1)

## 2020-11-15 LAB — FERRITIN: Ferritin: 45 ng/mL (ref 24–336)

## 2020-11-15 LAB — LACTATE DEHYDROGENASE: LDH: 186 U/L (ref 98–192)

## 2020-11-15 LAB — IRON AND TIBC
Iron: 36 ug/dL — ABNORMAL LOW (ref 45–182)
Saturation Ratios: 17 % — ABNORMAL LOW (ref 17.9–39.5)
TIBC: 211 ug/dL — ABNORMAL LOW (ref 250–450)
UIBC: 175 ug/dL

## 2020-11-15 MED ORDER — SODIUM CHLORIDE 0.9% FLUSH
10.0000 mL | Freq: Once | INTRAVENOUS | Status: AC
Start: 2020-11-15 — End: 2020-11-15
  Administered 2020-11-15: 10 mL via INTRAVENOUS
  Filled 2020-11-15: qty 10

## 2020-11-15 MED ORDER — HEPARIN SOD (PORK) LOCK FLUSH 100 UNIT/ML IV SOLN
500.0000 [IU] | Freq: Once | INTRAVENOUS | Status: AC
  Administered 2020-11-15: 500 [IU] via INTRAVENOUS
  Filled 2020-11-15: qty 5

## 2020-11-15 NOTE — Progress Notes (Signed)
Hematology and Oncology Follow Up Visit  [name] [id number] 23-Mar-1946 75 y.o. 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 is here today with his wife for follow-up.  He completed his radiation therapy about 10 days ago.  He is glad that it is over with.  The real problem is the lymphedema in his legs.  He does have significant lymphedema in the legs.  He developed a large ecchymoses on the back of his left calf muscle.  This opened up.  It was dressed when he came in.  We took out the dressing.  There is no bleeding.  We have told him to leave the area exposed.  He is taking the Demadex.  I told him to take 50 mg daily to try to help with the lymphedema.  Because of this wound and the left leg, the lymphedema clinic really will not do much with him.  He has had no problems with cough.  He has had no change in bowel or bladder habits.  He does urinate quite a bit.  There is been no problems with fever.  He has had no obvious headache.  Overall, I would say his performance status is probably ECOG 1.    Medications:  Allergies as of 11/15/2020       Reactions   Cefepime Hives, Shortness Of Breath   Cephaeline Hives   Morphine Swelling   A swollen stomach.   Morphine And Related Shortness Of Breath, Nausea And Vomiting, Swelling, Other (See Comments)    Agitation, tolerates dilaudid Other reaction(s): Other (See Comments) Agitation, tolerates dilaudid   Penicillins Hives, Shortness Of Breath, Rash   Has patient had a PCN reaction causing immediate rash, facial/tongue/throat swelling, SOB or lightheadedness with hypotension: Yes Has patient had a PCN reaction causing severe rash involving mucus membranes or skin necrosis: Yes Has patient had a PCN reaction that required hospitalization No Has patient had a PCN reaction occurring within the last 10 years: No If all of the above answers are "NO", then may proceed with Cephalosporin use.   Doxycycline Rash   Oxycodone-acetaminophen Other (See Comments)   Patient doesn't remember what type of reaction.        Medication List        Accurate as of November 15, 2020 11:30 AM. If you have any questions, ask your nurse or doctor.          albuterol 108 (90 Base) MCG/ACT inhaler Commonly known as: VENTOLIN HFA Inhale 1-2 puffs into the lungs every 4 (four) hours as needed for wheezing or shortness of breath.   ALPRAZolam 0.5 MG tablet Commonly known as: XANAX Take 1 tablet (0.5 mg total) by mouth 3 (three) times daily as needed for anxiety. for anxiety   apixaban 5 MG Tabs tablet Commonly known as: ELIQUIS Take 1 tablet (5  mg total) by mouth 2 (two) times daily.   B-6 PO Take 1 tablet by mouth daily.   FLUoxetine HCl 60 MG Tabs 1 tab po qd   fluticasone 50 MCG/ACT nasal spray Commonly known as: FLONASE INSTILL 2 SPRAYS IN EACH NOSTRIL EVERY DAY What changed:  how much to take how to take this when to take this reasons to take this additional instructions   gabapentin 300 MG capsule Commonly known as: NEURONTIN Take 2 capsules (600 mg total) by mouth at bedtime.   HYDROmorphone 2 MG tablet Commonly known as: DILAUDID Take 1-1.5 tablets (2-3 mg total) by mouth every 8 (eight) hours as needed for severe pain.   lidocaine-prilocaine cream Commonly known as: EMLA Apply  1 application topically as needed.   Melatonin 10 MG Tabs Take 10 mg by mouth at bedtime.   metoprolol tartrate 25 MG tablet Commonly known as: LOPRESSOR TAKE 1 TABLET TWO TIMES DAILY.   mupirocin ointment 2 % Commonly known as: BACTROBAN On leg wound w/dressing change qd or bid   pantoprazole 40 MG tablet Commonly known as: PROTONIX Take 1 tablet (40 mg total) by mouth every evening.   polyvinyl alcohol 1.4 % ophthalmic solution Commonly known as: LIQUIFILM TEARS Place 1 drop into both eyes as needed for dry eyes.   predniSONE 5 MG tablet Commonly known as: DELTASONE Take 15 mg by mouth daily.   temazepam 15 MG capsule Commonly known as: RESTORIL Take 1 capsule (15 mg total) by mouth at bedtime as needed for sleep.   torsemide 100 MG tablet Commonly known as: DEMADEX Take 0.5-1 tablets (50-100 mg total) by mouth daily as needed. What changed: reasons to take this   vitamin B-12 1000 MCG tablet Commonly known as: CYANOCOBALAMIN Take 1,000 mcg by mouth daily.   Vitamin D3 50 MCG (2000 UT) capsule Take 1 capsule (2,000 Units total) by mouth daily.        Allergies:  Allergies  Allergen Reactions   Cefepime Hives and Shortness Of Breath   Cephaeline Hives   Morphine Swelling    A swollen stomach.   Morphine And Related Shortness Of Breath, Nausea And Vomiting, Swelling and Other (See Comments)    Agitation, tolerates dilaudid Other reaction(s): Other (See Comments) Agitation, tolerates dilaudid   Penicillins Hives, Shortness Of Breath and Rash    Has patient had a PCN reaction causing immediate rash, facial/tongue/throat swelling, SOB or lightheadedness with hypotension: Yes Has patient had a PCN reaction causing severe rash involving mucus membranes or skin necrosis: Yes Has patient had a PCN reaction that required hospitalization No Has patient had a PCN reaction occurring within the last 10 years: No If all of the above answers are "NO", then may proceed  with Cephalosporin use.    Doxycycline Rash   Oxycodone-Acetaminophen Other (See Comments)    Patient doesn't remember what type of reaction.    Past Medical History, Surgical history, Social history, and Family History were reviewed and updated.  Review of Systems: Review of Systems  Constitutional:  Positive for malaise/fatigue.  HENT: Negative.    Eyes: Negative.   Respiratory:  Positive for shortness of breath.   Cardiovascular:  Positive for palpitations.  Gastrointestinal:  Positive for nausea.  Genitourinary: Negative.   Musculoskeletal:  Positive for joint pain and neck pain.  Skin: Negative.   Neurological:  Positive for dizziness.  Endo/Heme/Allergies:  Bruises/bleeds easily.  Psychiatric/Behavioral: Negative.       Physical Exam:  height is 5' 6" (1.676  m) and weight is 147 lb (66.7 kg). His oral temperature is 98.3 F (36.8 C). His blood pressure is 147/87 (abnormal) and his pulse is 67. His respiration is 18 and oxygen saturation is 94%.   Wt Readings from Last 3 Encounters:  11/15/20 147 lb (66.7 kg)  10/12/20 151 lb (68.5 kg)  10/10/20 153 lb 9.6 oz (69.7 kg)    Physical Exam Vitals reviewed.  HENT:     Head: Normocephalic and atraumatic.  Eyes:     Pupils: Pupils are equal, round, and reactive to light.  Cardiovascular:     Rate and Rhythm: Normal rate and regular rhythm.     Heart sounds: Normal heart sounds.  Pulmonary:     Effort: Pulmonary effort is normal.     Breath sounds: Normal breath sounds.  Abdominal:     General: Bowel sounds are normal.     Palpations: Abdomen is soft.     Comments: Abdominal exam does show a palpable lymph node in the left inguinal region.  This is nontender.  It measures about 2 cm.  It is mobile.  Musculoskeletal:        General: No tenderness or deformity. Normal range of motion.     Cervical back: Normal range of motion.     Comments: He has swelling in both legs.  There is more swelling in the left leg.  He has  the inguinal incisional scar in the left inguinal area.  There is a little bit of firmness and erythema in the left inguinal region.  He has decent range of motion of his joints.    Lymphadenopathy:     Cervical: No cervical adenopathy.  Skin:    General: Skin is warm and dry.     Findings: No erythema or rash.  Neurological:     Mental Status: He is alert and oriented to person, place, and time.  Psychiatric:        Behavior: Behavior normal.        Thought Content: Thought content normal.        Judgment: Judgment normal.   Lab Results  Component Value Date   WBC 9.7 11/15/2020   HGB 12.1 (L) 11/15/2020   HCT 37.5 (L) 11/15/2020   MCV 95.2 11/15/2020   PLT 182 11/15/2020   Lab Results  Component Value Date   FERRITIN 49 10/12/2020   IRON 142 10/12/2020   TIBC 186 (L) 10/12/2020   UIBC 45 (L) 10/12/2020   IRONPCTSAT 76 (H) 10/12/2020   Lab Results  Component Value Date   RETICCTPCT 2.0 10/12/2020   RBC 3.94 (L) 11/15/2020   No results found for: KPAFRELGTCHN, LAMBDASER, KAPLAMBRATIO No results found for: IGGSERUM, IGA, IGMSERUM No results found for: Odetta Pink, SPEI   Chemistry      Component Value Date/Time   NA 135 11/15/2020 0958   NA 140 04/10/2017 1046   NA 135 (L) 03/06/2017 1104   K 3.5 11/15/2020 0958   K 3.7 04/10/2017 1046   K 4.2 03/06/2017 1104   CL 91 (L) 11/15/2020 0958   CL 98 04/10/2017 1046   CO2 36 (H) 11/15/2020 0958   CO2 31 04/10/2017 1046   CO2 30 (H) 03/06/2017 1104   BUN 23 11/15/2020 0958   BUN 21 04/10/2017 1046   BUN 20.0 03/06/2017 1104   CREATININE 1.19 11/15/2020 0958   CREATININE 0.9 04/10/2017 1046   CREATININE 1.0 03/06/2017 1104  Component Value Date/Time   CALCIUM 8.7 (L) 11/15/2020 0958   CALCIUM 8.9 04/10/2017 1046   CALCIUM 9.4 03/06/2017 1104   ALKPHOS 52 11/15/2020 0958   ALKPHOS 65 04/10/2017 1046   ALKPHOS 78 03/06/2017 1104   AST 14 (L) 11/15/2020  0958   AST 20 03/06/2017 1104   ALT 11 11/15/2020 0958   ALT 21 04/10/2017 1046   ALT 20 03/06/2017 1104   BILITOT 0.6 11/15/2020 0958   BILITOT 0.47 03/06/2017 1104       Impression and Plan:   [name] is a very pleasant 75yo caucasian gentleman with history of hemochromatosis, homozygous for the C282Y mutation which has not been a problem for him so far.   He did have melanoma.  This actually was of the left knee.  He did have some sentinel nodes were taken after the left groin.  This was back in 2018.  We tried him on adjuvant immunotherapy and he really had a miserable time with the Opdivo.  I really would like to get a PET scan on him.  Now that he is finished radiation, I want to wait about 6 weeks and then do a PET scan to see how everything looks..  I still feel he is going to need some form of adjuvant therapy.  Because his tumor is not BRAF mutated, we really have no other option but to try another immunotherapy on him.  Hopefully he will tolerate this.  We will see about the PET scan and then see him back afterwards    [name], MD 8/10/202211:30 AM

## 2020-11-15 NOTE — Addendum Note (Signed)
Addended by: Amelia Jo I on: 11/15/2020 12:55 PM   Modules accepted: Orders

## 2020-11-20 ENCOUNTER — Telehealth: Payer: Self-pay | Admitting: Radiology

## 2020-11-20 DIAGNOSIS — Z8616 Personal history of COVID-19: Secondary | ICD-10-CM | POA: Diagnosis not present

## 2020-11-20 NOTE — Telephone Encounter (Signed)
Patient reports having redness, burning and pain to treatment area since treatment ended. Today there are three open areas of desquamation. Patient is using sonafine as directed. Recommended patient use Neosporin Plus to area. Patient verbalized understanding.

## 2020-11-21 ENCOUNTER — Encounter: Payer: Self-pay | Admitting: Hematology & Oncology

## 2020-11-21 NOTE — Progress Notes (Incomplete)
  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 He presented with a large blood blister along the left posterior calf when here for treatment on 11/03/20. No skin breakdown or signs of infection noted to this area. He spoke with his PCP in regards to this and was given new mu puromycin and recommended to place dressing over the blistered area. The patient additionally has significant edema to both lower extremities causing some weeping. I anticipate the patient will need to be seen at the wound center to receive extensive wrapping of both lower extremities but will make this decision up to his primary care physician. I also recommended he wrap this area with Ace bandage after his bandages are  placed.  Plan: The patient will follow-up with radiation oncology in one month .  ________________________________________________ -----------------------------------  Blair Promise, PhD, MD  This document serves as a record of services personally performed by Gery Pray, MD. It was created on his behalf by Roney Mans, a trained medical scribe. The creation of this record is based on the scribe's personal observations and the provider's statements to them. This document has been checked and  approved by the attending provider.

## 2020-11-21 NOTE — Telephone Encounter (Signed)
Patient reports areas of desquamation have developed into blisters that fill with white mixed with clear pus-like fluid. States there is no blood. He has been expressing the fluid from them when they start to turn yellow. States "there is an odor but it is not malodorous". Please advise.

## 2020-11-21 NOTE — Telephone Encounter (Signed)
Notified patient of appointment made with Dr Sondra Come on 11/22/2020 at 11:30 to evaluate area. Patient verbalized understanding.

## 2020-11-21 NOTE — Progress Notes (Signed)
Radiation Oncology         [phone] ________________________________  Name: [name] MRN: [id number]  Date: 11/22/2020  DOB: [birth year]  Follow-Up Visit Note  CC: Plotnikov, Evie Lacks, MD  [name], MD    ICD-10-CM   1. Malignant melanoma of left knee [facility])  C43.72       Diagnosis: The encounter diagnosis was 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[place]). 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 At the end of his most recent radiation treatment, the patient was noted to have a large blood blister along the left posterior calf due to significant edema of both lower extremities. The patient presented via telehealth visit with his PCP, Dr. Alain Marion, on 11/02/20; at this time his PCP increased torsemide Rx and instructed the patient to elevate legs to aid with edema.   The patient called earlier this week saying he had noticed some blistering along the left inguinal area on top of his seroma.  These subsequently ruptured were draining serous fluid.  He requested that he come in for evaluation of this issue.  He denies any chills or fever.  The patient most recently followed up with Dr. Marin Olp on 11/15/20; during which time the patients lower extremity lymphedema and left calf blister were addressed. Calf blister was covered when patient arrived; dressing was removed revealing blister to show no signs of bleeding. The patient was instructed to leave the wound open to air for now. The patient denied fever, cough, or headaches and was instructed to increase Demadex to 58m daily to aid with lymphedema.                          Allergies:  is allergic to cefepime, cephaeline, morphine, morphine and related, penicillins, doxycycline, and oxycodone-acetaminophen.  Meds: Current Outpatient Medications  Medication Sig Dispense Refill   vitamin B-12 (CYANOCOBALAMIN) 1000 MCG tablet Take 1,000 mcg by mouth daily.      albuterol (PROVENTIL HFA;VENTOLIN HFA) 108 (90 Base) MCG/ACT inhaler Inhale 1-2 puffs into the lungs every 4 (four) hours as needed for wheezing or shortness of breath. 1 Inhaler 1   ALPRAZolam (XANAX) 0.5 MG tablet Take 1 tablet (0.5 mg total) by mouth 3 (three) times daily as needed for anxiety. for anxiety 180 tablet 1   apixaban (ELIQUIS) 5 MG TABS tablet Take 1 tablet (5 mg total) by mouth 2 (two) times daily. 180 tablet 2   Cholecalciferol (VITAMIN D3) 2000 UNITS capsule Take 1 capsule (2,000 Units total) by mouth daily. 100 capsule 3   FLUoxetine HCl 60 MG TABS 1 tab po qd 90 tablet 1   fluticasone (FLONASE) 50 MCG/ACT nasal spray INSTILL 2 SPRAYS IN EACH NOSTRIL EVERY DAY (Patient taking differently: Place 2 sprays into both nostrils daily as needed for allergies.) 16 g 11   gabapentin (NEURONTIN) 300 MG capsule Take 2 capsules (600 mg total) by mouth at bedtime. 180 capsule 3   HYDROmorphone (DILAUDID) 2 MG tablet Take 1-1.5 tablets (2-3 mg total) by mouth every 8 (eight) hours as needed for severe pain. 90 tablet 0   lidocaine-prilocaine (  EMLA) cream Apply 1 application topically as needed. 30 g 5   Melatonin 10 MG TABS Take 10 mg by mouth at bedtime.     metoprolol tartrate (LOPRESSOR) 25 MG tablet TAKE 1 TABLET TWO TIMES DAILY. 180 tablet 3   mupirocin ointment (BACTROBAN) 2 % On leg wound w/dressing change qd or bid 60 g 1   pantoprazole (PROTONIX) 40 MG tablet Take 1 tablet (40 mg total) by mouth every evening. 90 tablet 3   polyvinyl alcohol (LIQUIFILM TEARS) 1.4 % ophthalmic solution Place 1 drop into both eyes as needed for dry eyes.     predniSONE (DELTASONE) 5 MG tablet Take 15 mg by  mouth daily.     Pyridoxine HCl (B-6 PO) Take 1 tablet by mouth daily.     temazepam (RESTORIL) 15 MG capsule Take 1 capsule (15 mg total) by mouth at bedtime as needed for sleep. 30 capsule 0   torsemide (DEMADEX) 100 MG tablet Take 0.5-1 tablets (50-100 mg total) by mouth daily as needed. (Patient taking differently: Take 50-100 mg by mouth daily as needed (fluid).) 90 tablet 1   No current facility-administered medications for this encounter.   Facility-Administered Medications Ordered in Other Encounters  Medication Dose Route Frequency Provider Last Rate Last Admin   heparin lock flush 100 unit/mL  250 Units Intracatheter PRN [place], Holli Humbles, NP       sodium chloride flush (NS) 0.9 % injection 10 mL  10 mL Intracatheter PRN [place], Holli Humbles, NP        Physical Findings: The patient is in no acute distress. Patient is alert and oriented.  height is 5' (1.524 m) and weight is 146 lb 8 oz (66.5 kg). His temporal temperature is 97.3 F (36.3 C) (abnormal). His blood pressure is 111/65 and his pulse is 65. His respiration is 18 and oxygen saturation is 94%. .  The patient continues to have a seroma in the left inguinal area.  On top of the seroma or 2 areas of breakdown measuring approximately 1 and half centimeters each.  Yellowish drainage is noted in this region.  No obvious signs of infection.  A culture was obtained of these open areas.  This does appear to communicate with the larger seroma.  Extensive probing was not performed today.  The patient is noted to have extensive weeping edema in the left lower extremity.  Upon standing clear drainage was noted falling off of his leg.   Lab Findings: Lab Results  Component Value Date   WBC 9.7 11/15/2020   HGB 12.1 (L) 11/15/2020   HCT 37.5 (L) 11/15/2020   MCV 95.2 11/15/2020   PLT 182 11/15/2020    Radiographic Findings: No results found.  Impression:  The encounter diagnosis was 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[place]). Stage  IIIC (T2N2M0) nodular melanoma of the LEFT thigh  The patient has developed extensive lymphedema in his left lower extremity with has complicated his management.  With his symptoms of edema he has developed significant weeping issues.  The seroma in the left inguinal area has developed some blistering  likely related to the extensive pressure and radiation treatments.  These blisters have ruptured and is draining some fluid.  As above this area was cultured today.  The patient will continue with Neosporin ointment.  He also has Bactroban.  Plan: The patient will follow-up with Dr. Barry Dienes later this week.  Patient is to keep his already scheduled follow-up appointment in our department.    ____________________________________  Blair Promise, PhD, MD   This document serves as a record of services personally performed by Gery Pray, MD. It was created on his behalf by Roney Mans, a trained medical scribe. The creation of this record is based on the scribe's personal observations and the provider's statements to them. This document has been checked and approved by the attending provider.

## 2020-11-22 ENCOUNTER — Other Ambulatory Visit: Payer: Self-pay

## 2020-11-22 ENCOUNTER — Encounter: Payer: Self-pay | Admitting: Radiation Oncology

## 2020-11-22 ENCOUNTER — Ambulatory Visit
Admission: RE | Admit: 2020-11-22 | Discharge: 2020-11-22 | Disposition: A | Discharge status: Home / Self Care | Payer: Medicare Other | Source: Ambulatory Visit | Attending: Radiation Oncology | Admitting: Radiation Oncology

## 2020-11-22 ENCOUNTER — Other Ambulatory Visit: Payer: Self-pay | Admitting: Radiology

## 2020-11-22 DIAGNOSIS — Z7901 Long term (current) use of anticoagulants: Secondary | ICD-10-CM | POA: Insufficient documentation

## 2020-11-22 DIAGNOSIS — Z79899 Other long term (current) drug therapy: Secondary | ICD-10-CM | POA: Diagnosis not present

## 2020-11-22 DIAGNOSIS — C4372 Malignant melanoma of left lower limb, including hip: Secondary | ICD-10-CM | POA: Insufficient documentation

## 2020-11-22 DIAGNOSIS — Z923 Personal history of irradiation: Secondary | ICD-10-CM | POA: Diagnosis not present

## 2020-11-22 DIAGNOSIS — I890 Lymphedema, not elsewhere classified: Secondary | ICD-10-CM | POA: Insufficient documentation

## 2020-11-22 NOTE — Progress Notes (Signed)
Patient in for follow up due to receiving radiation therapy to left leg. Patient completed radiation treatment on 11/06/20. Patient reports having pain to left leg, rating 6/10. Patient reports having severe edema to left lower extremity. Noted area to have blood tinged drainage. Patient also reports having wound to left groin. Patient has recently started back taking torsemide 30-40 mg daily. Noted patient to have large area of ecchymosis to left hip area. Patient currently taking Eliqus BID. Wound culture obtained by MD, awaiting results.

## 2020-11-24 ENCOUNTER — Other Ambulatory Visit: Payer: Self-pay | Admitting: Radiation Oncology

## 2020-11-24 DIAGNOSIS — L03314 Cellulitis of groin: Secondary | ICD-10-CM | POA: Diagnosis not present

## 2020-11-24 DIAGNOSIS — C4372 Malignant melanoma of left lower limb, including hip: Secondary | ICD-10-CM | POA: Diagnosis not present

## 2020-11-24 DIAGNOSIS — I890 Lymphedema, not elsewhere classified: Secondary | ICD-10-CM | POA: Diagnosis not present

## 2020-11-24 LAB — AEROBIC/ANAEROBIC CULTURE W GRAM STAIN (SURGICAL/DEEP WOUND)

## 2020-11-24 LAB — AEROBIC CULTURE W GRAM STAIN (SUPERFICIAL SPECIMEN)

## 2020-11-24 MED ORDER — SULFAMETHOXAZOLE-TRIMETHOPRIM 800-160 MG PO TABS: 1.0000 | ORAL_TABLET | Freq: Two times a day (BID) | ORAL | 0 refills | Status: DC

## 2020-12-01 ENCOUNTER — Encounter: Payer: Self-pay | Admitting: Radiology

## 2020-12-01 ENCOUNTER — Telehealth: Payer: Self-pay | Admitting: Lab

## 2020-12-01 NOTE — Progress Notes (Signed)
  Chronic Care Management   Outreach Note  12/01/2020 Name: [name] MRN: [id number] DOB: 05/07/1945  Referred by: Cassandria Anger, MD Reason for referral : Medication Management   A second unsuccessful telephone outreach was attempted today. The patient was referred to pharmacist for assistance with care management and care coordination.  Follow Up Plan:   [name]

## 2020-12-04 DIAGNOSIS — Z8616 Personal history of COVID-19: Secondary | ICD-10-CM | POA: Diagnosis not present

## 2020-12-04 NOTE — Assessment & Plan Note (Signed)
Chronic pain Continue to monitor with current dose  Potential benefits of a long term opioids use as well as potential risks (i.e. addiction risk, apnea etc) and complications (i.e. Somnolence, constipation and others) were explained to the patient and were aknowledged. ?trekking poles for walking

## 2020-12-04 NOTE — Assessment & Plan Note (Signed)
CFS Discussed.  Treat chronic pain

## 2020-12-04 NOTE — Assessment & Plan Note (Signed)
Chronic edema.  Little better on torsemide at the increased dose

## 2020-12-06 NOTE — Progress Notes (Signed)
Radiation Oncology         [phone] ________________________________  Name: [name] MRN: [id number]  Date: 12/07/2020  DOB: 25-Oct-1945  Follow-Up Visit Note  CC: Plotnikov, Evie Lacks, MD  [name], MD    ICD-10-CM   1. Malignant melanoma of left knee [facility])  C43.72       Diagnosis: The encounter diagnosis was 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[place]). 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 During our last visit, I discussed with the patient that the blistering seroma is likely due to the extensive pressure from his edema and radiation treatments. The left groin area was cultured with results showing an abundance of white blood cells; both with PMN and mononuclear presence, as well as a few gram negative rods .  Culture showed Citrobacter koseri.  This was sensitive to Septra DS.  The patient was placed on this medication and continues on this.  He reports improvement since being on oral antibiotics.        The patient followed up with Dr. Barry Dienes on 11/24/20 for further assessment of his left lower extremity lymphedema and seroma (edema present in both lower extremities although more so in the left leg). Physical exam performed revealed cellulitis and murky drainage around the groin incision, as well as redness around the leg incision without drainage. Dr. Barry Dienes placed a referral for PT at this time to address  lymphedema.     He is scheduled for a PET scan later this month..                   Allergies:  is allergic to cefepime, cephaeline, morphine, morphine and related, penicillins, doxycycline, and oxycodone-acetaminophen.  Meds: Current Outpatient Medications  Medication Sig Dispense Refill   ALPRAZolam (XANAX) 0.5 MG tablet Take 1 tablet (0.5 mg total) by mouth 3 (three) times daily as needed for anxiety. for anxiety 180 tablet 1   Cholecalciferol (VITAMIN D3) 2000 UNITS capsule Take 1 capsule (2,000 Units total) by mouth daily. 100 capsule 3   fluticasone (FLONASE) 50 MCG/ACT nasal spray INSTILL 2 SPRAYS IN EACH NOSTRIL EVERY DAY (Patient taking differently: Place 2 sprays into both nostrils daily as needed for allergies.) 16 g 11   gabapentin (NEURONTIN) 300 MG capsule Take 2 capsules (600 mg total) by mouth at bedtime. 180 capsule 3   HYDROmorphone (DILAUDID) 2 MG tablet Take 1-1.5 tablets (2-3 mg total) by mouth every 8 (eight) hours as needed for severe pain. 90 tablet 0   lidocaine-prilocaine (EMLA) cream Apply 1 application topically as needed. 30 g 5   Melatonin 10 MG TABS Take 10 mg by mouth at bedtime.     metoprolol tartrate (LOPRESSOR) 25 MG tablet TAKE 1 TABLET TWO TIMES DAILY. 180 tablet 3   mupirocin ointment (BACTROBAN) 2 % On leg wound w/dressing change qd or bid 60 g 1   pantoprazole (PROTONIX) 40 MG tablet Take 1 tablet (  40 mg total) by mouth every evening. 90 tablet 3   polyvinyl alcohol (LIQUIFILM TEARS) 1.4 % ophthalmic solution Place 1 drop into both eyes as needed for dry eyes.     predniSONE (DELTASONE) 5 MG tablet Take 15 mg by mouth daily.     Pyridoxine HCl (B-6 PO) Take 1 tablet by mouth daily.     sulfamethoxazole-trimethoprim (BACTRIM DS) 800-160 MG tablet Take 1 tablet by mouth 2 (two) times daily. 14 tablet 0   temazepam (RESTORIL) 15 MG capsule Take 1 capsule (15 mg total) by mouth at bedtime as needed for sleep. 30 capsule 0   torsemide (DEMADEX) 100 MG  tablet Take 0.5-1 tablets (50-100 mg total) by mouth daily as needed. (Patient taking differently: Take 50-100 mg by mouth daily as needed (fluid).) 90 tablet 1   vitamin B-12 (CYANOCOBALAMIN) 1000 MCG tablet Take 1,000 mcg by mouth daily.      albuterol (PROVENTIL HFA;VENTOLIN HFA) 108 (90 Base) MCG/ACT inhaler Inhale 1-2 puffs into the lungs every 4 (four) hours as needed for wheezing or shortness of breath. 1 Inhaler 1   apixaban (ELIQUIS) 5 MG TABS tablet Take 1 tablet (5 mg total) by mouth 2 (two) times daily. 180 tablet 2   FLUoxetine HCl 60 MG TABS 1 tab po qd 90 tablet 1   No current facility-administered medications for this encounter.   Facility-Administered Medications Ordered in Other Encounters  Medication Dose Route Frequency Provider Last Rate Last Admin   heparin lock flush 100 unit/mL  250 Units Intracatheter PRN [place], Holli Humbles, NP       sodium chloride flush (NS) 0.9 % injection 10 mL  10 mL Intracatheter PRN [place], Holli Humbles, NP        Physical Findings: The patient is in no acute distress. Patient is alert and oriented.  height is 5' 6"  (1.676 m) and weight is 142 lb 3.2 oz (64.5 kg). His temperature is 97.6 F (36.4 C). His blood pressure is 98/52 (abnormal) and his pulse is 62. His respiration is 20 and oxygen saturation is 96%. .  No significant changes. Lungs are clear to auscultation bilaterally. Heart has regular rate and rhythm. No palpable cervical, supraclavicular, or axillary adenopathy. Abdomen soft, non-tender, normal bowel sounds.  The left inguinal area shows less swelling.  No obvious signs of infection.  2 small open areas which drained clear fluid.  The left lower leg region is wrapped today.  This was not removed.  The patient is undergoing physical therapy at this time.   Lab Findings: Lab Results  Component Value Date   WBC 9.7 11/15/2020   HGB 12.1 (L) 11/15/2020   HCT 37.5 (L) 11/15/2020   MCV 95.2 11/15/2020   PLT 182 11/15/2020     Radiographic Findings: No results found.  Impression:  The encounter diagnosis was 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[place]). 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  Oral antibiotics seem to have helped this situation.  Plan: Routine follow-up in 3 months.  The patient reports he will be following up with Dr. Barry Dienes tomorrow.    ____________________________________  Blair Promise, PhD, MD   This document serves as a record of services personally performed by Gery Pray, MD. It was created on his behalf by Roney Mans, a trained medical scribe. The creation of this record is based on the scribe's personal observations and the provider's statements to them. This document  has been checked and approved by the attending provider.

## 2020-12-07 ENCOUNTER — Encounter: Payer: Self-pay | Admitting: Physical Therapy

## 2020-12-07 ENCOUNTER — Other Ambulatory Visit: Payer: Self-pay

## 2020-12-07 ENCOUNTER — Ambulatory Visit
Admission: RE | Admit: 2020-12-07 | Discharge: 2020-12-07 | Disposition: A | Discharge status: Home / Self Care | Payer: Medicare Other | Source: Ambulatory Visit | Attending: Radiation Oncology | Admitting: Radiation Oncology

## 2020-12-07 ENCOUNTER — Ambulatory Visit: Payer: Medicare Other | Attending: Hematology & Oncology | Admitting: Physical Therapy

## 2020-12-07 ENCOUNTER — Other Ambulatory Visit: Payer: Self-pay | Admitting: Hematology & Oncology

## 2020-12-07 ENCOUNTER — Encounter: Payer: Self-pay | Admitting: Radiation Oncology

## 2020-12-07 DIAGNOSIS — Z7901 Long term (current) use of anticoagulants: Secondary | ICD-10-CM | POA: Insufficient documentation

## 2020-12-07 DIAGNOSIS — Z79899 Other long term (current) drug therapy: Secondary | ICD-10-CM | POA: Insufficient documentation

## 2020-12-07 DIAGNOSIS — C4372 Malignant melanoma of left lower limb, including hip: Secondary | ICD-10-CM | POA: Diagnosis not present

## 2020-12-07 DIAGNOSIS — R609 Edema, unspecified: Secondary | ICD-10-CM | POA: Diagnosis not present

## 2020-12-07 DIAGNOSIS — Z923 Personal history of irradiation: Secondary | ICD-10-CM | POA: Insufficient documentation

## 2020-12-07 DIAGNOSIS — I890 Lymphedema, not elsewhere classified: Secondary | ICD-10-CM | POA: Insufficient documentation

## 2020-12-07 NOTE — Therapy (Signed)
[place], [place], [address] Phone: [phone]   Fax:  [phone]  Physical Therapy Treatment  Patient Details  Name: [name] MRN: [id number] Date of Birth: 1945/06/12 Referring Provider (PT): Dr. Marin Olp   Encounter Date: 12/07/2020   PT End of Session - 12/07/20 1640     Visit Number 3    Number of Visits 228    Date for PT Re-Evaluation 02/06/21    PT Start Time 1400    PT Stop Time 1445    PT Time Calculation (min) 45 min    Activity Tolerance Patient tolerated treatment well    Behavior During Therapy [facility] for tasks assessed/performed             Past Medical History:  Diagnosis Date   Alcoholism /alcohol abuse    per family   Allergic rhinitis    Allergy    Anxiety    Bacterial infection    CAD (coronary artery disease)    minimal coronary plaque in the LAD and right coronary system. 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 7-06. Last nuclear stress 11-17-06 fixed anterior/ inferior defect, no inducible ischemia, EF 81%   Cataract    CHF (congestive heart failure) (HCC)    Chronic back pain    "all over back"   Chronic neck pain    COPD (chronic obstructive pulmonary disease) ([place])    Depression    Diverticulosis    DVT (deep venous thrombosis) ([place]) 06/2020   Dyspnea    with exertion   Dysrhythmia 01/24/2012   past hx. A.Fib x1 episode-responded to med.   Falls frequently    "since 02/2013" (06/16/2013)   Family history of cancer    Genetic testing 03/06/2017   Multi-Cancer panel (83 genes) @ [facility] - No pathogenic mutations detected   GERD (gastroesophageal reflux disease)    Goals of care, counseling/discussion 10/12/2020   Hemochromatosis    dx'd 14 yrs ago last ferritin Aug 11, 08 52 (22-322), Fe 136 ("I had 250 phlebotomies for that")   High cholesterol    hx   History of radiation  therapy 11/06/2020   left inguinal thigh 10/10/2020-11/06/2020  Dr Gery Pray   Hx of colonic polyps    Hx of colonoscopy    Hypertension    Iron deficiency anemia due to chronic blood loss 12/27/2016   Malignant melanoma of knee, left (HCC)    Myocardial infarction [facility]) 2006   "related to catheterization"   Narcotic abuse ([place])    per family   Neuropathy    fingers and toes   Osteoarthritis    Peripheral edema    Bilateral ankles   PONV (postoperative nausea and vomiting)    Pulmonary embolism ([place]) 06/2020   RA (rheumatoid arthritis) ([place])     Past Surgical History:  Procedure Laterality Date   ABDOMINAL ADHESION SURGERY  ~ 1968   ANKLE RECONSTRUCTION Right 6-09   [facility]   APPENDECTOMY  ~ [place] Left 03/27/2017   Procedure: ASPIRATION OF SEROMA;  Surgeon: Stark Klein, MD;  Location: [place];  Service: General;  Laterality: Left;   BONE TUMOR RESECTION  ~ 1954   "taken off my mastoid"   [place] Right 1990's   CATARACT EXTRACTION, BILATERAL Bilateral 01-24-12   CORONARY ANGIOPLASTY WITH STENT PLACEMENT  2006   "while repairing 1st stent, a second area  tore and they had to place 2nd stent " ?LAD & CX   CORONARY ANGIOPLASTY WITH STENT PLACEMENT  06/17/2014   CYST EXCISION  "several OR's"   "backX 2, back of my neck, face, inside right bicept, chest, wrist"   FOOT SURGERY Right 11-08   for removal of bone spurs-   HAMMER TOE SURGERY Right 07/2012   "broke 4 hammertoes"    HARDWARE REMOVAL  03/09/2012   Procedure: HARDWARE REMOVAL;  Surgeon: Nita Sells, MD;  Location: [place];  Service: Orthopedics;  Laterality: Left;  Hardware Removal from Left Shoulder   HARVEST BONE GRAFT  02/06/2012   Procedure: HARVEST ILIAC BONE GRAFT;  Surgeon: Nita Sells, MD;  Location: [facility];  Service: Orthopedics;;  bone marrow aspirqation    INGUINAL HERNIA REPAIR Bilateral    JOINT REPLACEMENT      KNEE SURGERY Left ~ 2003   "6-12 months after uni knee removed synovial sack"   LEFT HEART CATHETERIZATION WITH CORONARY ANGIOGRAM N/A 06/17/2014   PCI of diffuse severe stenosis in the proximal to mid LAD using overlapping drug-eluting stents.   LUMBAR WOUND DEBRIDEMENT N/A 03/17/2013   Procedure: Incision and drainage of superficial lumbar wound;  Surgeon: Floyce Stakes, MD;  Location: [place] [facility];  Service: Neurosurgery;  Laterality: N/A;  Incision and drainage of superficial lumbar wound   LYMPH NODE BIOPSY N/A 08/15/2020   Procedure: EXCISIONAL LYMPH NODE BIOPSY LEFT INGUINAL REGION, EXCISION OF LEFT THIGH NODULE, EXCISION OF LEFT ABDOMINAL WALL LESION;  Surgeon: Stark Klein, MD;  Location[facility];  Service: General;  Laterality: N/A;   MECKEL DIVERTICULUM EXCISION  ~ [place] LYMPH NODE BIOPSY Left 02/12/2017   WIDE LOCAL EXCISION LEFT KNEE MELANOMA, ADVANCEMENT FLAP CLOSURE, AND SENTINEL LYMPH NODE MAPPING AND BIOPSY.   MELANOMA EXCISION WITH SENTINEL LYMPH NODE BIOPSY Left 02/12/2017   Procedure: WIDE LOCAL EXCISION LEFT KNEE MELANOMA, ADVANCEMENT FLAP CLOSURE, AND SENTINEL LYMPH NODE MAPPING AND BIOPSY.;  Surgeon: Stark Klein, MD;  Location: [place];  Service: General;  Laterality: Left;  GENERAL AND LOCAL   ORIF SHOULDER FRACTURE  02/06/2012   Procedure: OPEN REDUCTION INTERNAL FIXATION (ORIF) SHOULDER FRACTURE;  Surgeon: Nita Sells, MD;  Location[facility];  Service: Orthopedics;  Laterality: Left;  ORIF of a Left Shoulder Fracture with  Iliac Crest Bone Graft aspiration    PORTACATH PLACEMENT Left 03/27/2017   Procedure: INSERTION PORT-A-CATH;  Surgeon: Stark Klein, MD;  Location: [place];  Service: General;  Laterality: Left;   POSTERIOR LUMBAR FUSION  12-10   L4-5 diskectomy w/ fusion, cage placement and rods; [place]   POSTERIOR LUMBAR FUSION 4 LEVEL N/A 03/02/2013   Procedure: Lumbar One to Sacral One Posterior lumbar  interbody fusion;  Surgeon: Floyce Stakes, MD;  Location: [place] [facility];  Service: Neurosurgery;  Laterality: N/A;  L1 to S1 Posterior lumbar interbody fusion   REPLACEMENT UNICONDYLAR JOINT KNEE Left ~ 2003   "~ 6 months after total knee replaced"   SHOULDER ARTHROSCOPY Left ~ 2004 X 2   "@ [facility]; left bone splinter in & had to clean it out"   TOTAL ANKLE REPLACEMENT Right 2008   at [facility] Bilateral 2002   TOTAL SHOULDER REPLACEMENT Left 2006   TOTAL SHOULDER REPLACEMENT Right ~ 2007   Dr. Marlou Sa    There were no vitals filed for this visit.   Subjective Assessment - 12/07/20  1406     Subjective Pt said he called Dr. Johnsie Cancel and that his heart was fine for PT treatment He still has some trouble with shortness of breath with actvity.  Pt lives at [place] and and want to get to into the exercise class there.  He says that he notced that he is having some trouble keeping his head up at the end of the day. He has open wound on left groin that is draining profusely. He has to change the dressing frequently . He has the wound on his left leg that is bandaged with tape at the top and bottom .He says both legs swell similarly with right ankle slightly enlarged due to the full ankle replacement.  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  Recent recurrent melanoma noted with recent excision of left thigh nodule and deep left inguinal lymph node on 08/15/20 by Dr. Barry Dienes in the left inguinal region with left thigh nodule.  Recurrent seroma trouble in the left inguinal area with drain placed but removed but still needing aspirations. Currently getting radiation of the left thigh and groin with 8 left.  Most likely more chemotherapy needed. OTHER: CAD, afib, high  cholesterol, COPD, GERD, RA, lumbar fusion with 5 months in the hospital due to sepsis with pt reporting fusion needs to be repairing, right ankle replacement, bil shoulder repacement    Patient Stated Goals What to do to control the swelling    Currently in Pain? Yes    Pain Score 4    even with the pill.   Pain Location --   groin, back, shoulders and hands. and also from rhuematod arthritis               [facility] PT Assessment - 12/07/20 0001       Assessment   Medical Diagnosis metastatic melanom    Referring Provider (PT) Dr. Marin Olp    Onset Date/Surgical Date 04/08/16      Prior Function   Level of Independence Needs assistance with ADLs               LYMPHEDEMA/ONCOLOGY QUESTIONNAIRE - 12/07/20 0001       Right Lower Extremity Lymphedema   10 cm Proximal to Suprapatella 36 cm    At Midpatella/Popliteal Crease 34 cm    30 cm Proximal to Floor at Lateral Plantar Foot 34 cm    20 cm Proximal to Floor at Lateral Plantar Foot 30 1    10  cm Proximal to Floor at Lateral Malleoli 26 cm    5 cm Proximal to 1st MTP Joint 23.5 cm    Across MTP Joint 22.4 cm    Around Proximal Great Toe 8.5 cm                        [facility] Adult PT Treatment/Exercise - 12/07/20 0001       Manual Therapy   Manual Therapy Edema management;Manual Lymphatic Drainage (MLD);Compression Bandaging    Manual therapy comments pt given another piece of tg soft to appy to left leg over dressing once he gets home    Edema Management pt encouraged to bring all bandages back to next treatment    Manual  Lymphatic Drainage (MLD) In supine, with legs elevated, 3 diaphragmatic breaths, right axillary nodes, right inguino-axillo anastamosis, right inguinal nodes. right thigh medial knee to foot and ankle.  Extra time spent on lower leg with pt with large amount of pitting edema softening and moving up toward thigh.  lotion applied to leg, then tg soft, artiflex 1-8 and 1-10 cm bandage.                     PT Education - 12/07/20 1639     Education Details Pt to continue with elevation of legs as tolerated, ok to wear tg soft on both legs to see if it will help decrease his swelling    Person(s) Educated Patient    Methods Explanation    Comprehension Verbalized understanding                 PT Long Term Goals - 12/07/20 1651       PT LONG TERM GOAL #1   Title Pt will obtain appropriate compression garments to manage LE edema    Time 8    Period Weeks    Status On-going      PT LONG TERM GOAL #2   Title Pt will be educated on skin care for LE lymphedema    Time 8    Period Weeks    Status On-going      PT LONG TERM GOAL #3   Title Pt will report he knows what to do to manage his lymphedema at home    Time 8    Period Weeks                   Plan - 12/07/20 1641     Clinical Impression Statement Pt returns to PT for management of his lympehdema though he still is having drainage from left groin wound and healing wound on left leg. Treatment was focused on right leg since it did not have wournds .He was able to tolerate MLD today with lessening of pitting edema from right lower leg and received compression bandaging with 2 bandages. He will try tg soft on its own for each leg below the knee ( over the dressing on his left leg) until he can come back for more appointments. Feel he will responde to compression bandaging and ultimatedly be able to mahage at home with velcro type of garments to lowere legs and ankles as he will have trouble donning compression stockings with his arthritic hands. Recert sent today for 8 more weeks of treatment. as it may delayed due to healing wounds.  Received message from Dr. Marin Olp that he has placed a wound care center referral    Personal Factors and Comorbidities Age;Comorbidity 3+    Comorbidities complicated medical history, active cancer    Examination-Activity Limitations Bathing;Lift;Stand;Bed  Mobility;Locomotion Level;Bend;Reach Overhead;Carry;Sleep;Squat;Dressing;Stairs    Examination-Participation Restrictions Cleaning;Meal Prep;Yard Work;Community Activity;Occupation;Laundry;Shop    Stability/Clinical Decision Making Unstable/Unpredictable    Rehab Potential Fair    PT Duration 8 weeks    PT Treatment/Interventions ADLs/Self Care Home Management;Compression bandaging;Patient/family education;Therapeutic exercise;DME Instruction;Manual lymph drainage;Taping;Orthotic Fit/Training    PT Next Visit Plan how is wound? wound care? how did tg soft go? remeasure once a week. continue with treatment to right leg and help with getting comrpession garments and progress to left leg when wounds hea ,caution pt about cardiac symptoms, educate on skin care             Patient will benefit  from skilled therapeutic intervention in order to improve the following deficits and impairments:  Increased edema  Visit Diagnosis: Lymphedema, not elsewhere classified - Plan: PT plan of care cert/re-cert     Problem List Patient Active Problem List   Diagnosis Date Noted   Blisters of multiple sites 11/05/2020   Goals of care, counseling/discussion 10/12/2020   Atherosclerosis of aorta ([place]) 07/24/2020   Symptomatic anemia 05/27/2020   Pulmonary embolus ([place]) 05/27/2020   Acute respiratory failure with hypoxia ([place]) 05/26/2020   Callus of foot 04/21/2018   Edema 03/27/2018   Toxic gastroenteritis and colitis 03/25/2018   Other ulcerative 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 ([place]) 54/98/2641   Diarrhea 12/23/2017   Nausea & vomiting 12/23/2017   Herpes zoster 07/07/2017   Constipation 04/21/2017   Genetic testing 03/06/2017   Family history of cancer    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[place]) 02/12/2017   Iron deficiency anemia due to chronic blood loss 12/27/2016   Scalp wound 06/13/2016   Scalp laceration, subsequent encounter 06/04/2016   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   Pain  in joint, ankle and foot 09/28/2015   Anemia due to chronic blood loss 09/28/2015   Hyponatremia 09/28/2015   Cramp in limb 09/28/2015   Exertional angina ([place]) 06/17/2014   DOE (dyspnea on exertion) 04/28/2014   Hypokalemia 03/11/2014   Counseling regarding goals of care 12/31/2013   Impaired glucose tolerance 12/31/2013   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[place]) 11/18/2013   Fever 11/18/2013   Leukocytosis 10/30/2013   Fatigue 10/22/2013   Hoarseness of voice 08/10/2013   Sleep disorder 07/05/2013   Tension headache 07/01/2013   Polypharmacy 06/19/2013   Lumbar post-laminectomy syndrome 05/27/2013   Degenerative arthritis of spine 05/26/2013   Hemochromatosis 05/26/2013   Wound infection after surgery 05/26/2013   Normocytic anemia 05/26/2013   Unintentional weight loss 05/26/2013   Protein-calorie malnutrition, severe ([place]) 05/25/2013   Chronic pain syndrome 05/23/2013   COPD (chronic obstructive pulmonary disease) ([place]) 05/06/2013   Frequent falls 05/06/2013   Encounter for postoperative wound care 05/06/2013   Atrial fibrillation ([place]) 08/07/2010   Rheumatoid arthritis ([place]) 10/18/2008   Anxiety 11/09/2007   Elevated lipids 03/30/2007   PSEUDOGOUT 03/30/2007   Essential hypertension 03/30/2007   Coronary atherosclerosis 03/30/2007   Donato Heinz. Owens Shark PT  [place] 12/07/2020, 4:55 PM  [place], [place], [address] Phone: [phone]   Fax:  [phone]  Name: RYLEE NUZUM MRN: [id number] Date of Birth: [birth year]

## 2020-12-07 NOTE — Progress Notes (Signed)
Patient in for follow up due to completing radiation treatment to left groin. Patient reports wound to area has improved since he has been taking Bactrium. Noted patient to have severe weeping to left groin, saturating underwear and pants. Encouraged patient to use a depends to assist with soaking up some of the drainage from wound to left groin to avoid further skin breakdown,  patient voiced understanding. Continues to have some swelling to bilateral lower extremities.

## 2020-12-08 ENCOUNTER — Telehealth: Payer: Self-pay | Admitting: *Deleted

## 2020-12-08 DIAGNOSIS — C4372 Malignant melanoma of left lower limb, including hip: Secondary | ICD-10-CM | POA: Diagnosis not present

## 2020-12-08 DIAGNOSIS — I890 Lymphedema, not elsewhere classified: Secondary | ICD-10-CM | POA: Diagnosis not present

## 2020-12-08 NOTE — Telephone Encounter (Signed)
Called patient to inform of fu on 03-08-21 @ 11:30 am with Dr. Sondra Come, spoke with patient and he is aware of this appt.

## 2020-12-11 DIAGNOSIS — Z8616 Personal history of COVID-19: Secondary | ICD-10-CM | POA: Diagnosis not present

## 2020-12-13 ENCOUNTER — Telehealth: Payer: Self-pay

## 2020-12-13 DIAGNOSIS — I2510 Atherosclerotic heart disease of native coronary artery without angina pectoris: Secondary | ICD-10-CM

## 2020-12-13 DIAGNOSIS — I480 Paroxysmal atrial fibrillation: Secondary | ICD-10-CM

## 2020-12-13 NOTE — Telephone Encounter (Signed)
-----   Message from Josue Hector, MD sent at 12/09/2020  2:49 PM EDT ----- Regarding: RE: lymphedema Will order lexiscan myouve and echo to make sure ok to use compression He has had normal EF in past and no angina  ----- Message ----- From: Stark Bray, PT Sent: 12/07/2020  12:09 PM EDT To: Josue Hector, MD Subject: lymphedema                                     Hi Dr. Johnsie Cancel,   [name] was evaluated in our lymphedema clinic for bil LE lymphedema per referral by his oncologist and surgeon.  I had concerns about his orthopnea and SOB with elevating his legs before starting compression bandaging and fluid movement. He reports it is a bit worse than normal but that it is stable.   Any concerns about this cardiac-wise?  Shan Levans, PT

## 2020-12-13 NOTE — Telephone Encounter (Signed)
Called patient with Dr. Kyla Balzarine recommendations. Will send instructions for test through Mychart and will mail to patient as well.

## 2020-12-14 ENCOUNTER — Telehealth: Payer: Self-pay | Admitting: Lab

## 2020-12-14 NOTE — Progress Notes (Signed)
  Chronic Care Management   Outreach Note  12/14/2020 Name: MARISOL GLAZER MRN: [id number] DOB: [birth year]  Referred by: Cassandria Anger, MD Reason for referral : Medication Management   Third unsuccessful telephone outreach was attempted today. The patient was referred to the pharmacist for assistance with care management and care coordination.   Follow Up Plan:   [place]

## 2020-12-14 NOTE — Addendum Note (Signed)
Addended by: Aris Georgia, Griff Badley L on: 12/14/2020 10:14 AM   Modules accepted: Orders

## 2020-12-14 NOTE — Addendum Note (Signed)
Addended by: Jenkins Rouge C on: 12/14/2020 12:12 PM   Modules accepted: Orders

## 2020-12-18 ENCOUNTER — Other Ambulatory Visit: Payer: Self-pay

## 2020-12-18 ENCOUNTER — Ambulatory Visit: Payer: Medicare Other

## 2020-12-18 ENCOUNTER — Ambulatory Visit (HOSPITAL_BASED_OUTPATIENT_CLINIC_OR_DEPARTMENT_OTHER): Payer: Medicare Other

## 2020-12-18 DIAGNOSIS — R9082 White matter disease, unspecified: Secondary | ICD-10-CM | POA: Diagnosis not present

## 2020-12-18 DIAGNOSIS — I2510 Atherosclerotic heart disease of native coronary artery without angina pectoris: Secondary | ICD-10-CM | POA: Insufficient documentation

## 2020-12-18 DIAGNOSIS — M25522 Pain in left elbow: Secondary | ICD-10-CM | POA: Diagnosis not present

## 2020-12-18 DIAGNOSIS — I517 Cardiomegaly: Secondary | ICD-10-CM | POA: Diagnosis not present

## 2020-12-18 DIAGNOSIS — I739 Peripheral vascular disease, unspecified: Secondary | ICD-10-CM | POA: Diagnosis not present

## 2020-12-18 DIAGNOSIS — Z8616 Personal history of COVID-19: Secondary | ICD-10-CM | POA: Diagnosis not present

## 2020-12-18 DIAGNOSIS — M160 Bilateral primary osteoarthritis of hip: Secondary | ICD-10-CM | POA: Diagnosis not present

## 2020-12-18 DIAGNOSIS — R509 Fever, unspecified: Secondary | ICD-10-CM | POA: Diagnosis not present

## 2020-12-18 DIAGNOSIS — M79671 Pain in right foot: Secondary | ICD-10-CM | POA: Diagnosis not present

## 2020-12-18 DIAGNOSIS — I480 Paroxysmal atrial fibrillation: Secondary | ICD-10-CM | POA: Insufficient documentation

## 2020-12-18 DIAGNOSIS — L03116 Cellulitis of left lower limb: Secondary | ICD-10-CM | POA: Diagnosis not present

## 2020-12-18 DIAGNOSIS — M79604 Pain in right leg: Secondary | ICD-10-CM | POA: Diagnosis not present

## 2020-12-18 DIAGNOSIS — S199XXA Unspecified injury of neck, initial encounter: Secondary | ICD-10-CM | POA: Diagnosis not present

## 2020-12-18 DIAGNOSIS — M4319 Spondylolisthesis, multiple sites in spine: Secondary | ICD-10-CM | POA: Diagnosis not present

## 2020-12-18 DIAGNOSIS — S0990XA Unspecified injury of head, initial encounter: Secondary | ICD-10-CM | POA: Diagnosis not present

## 2020-12-18 DIAGNOSIS — L03115 Cellulitis of right lower limb: Secondary | ICD-10-CM | POA: Diagnosis not present

## 2020-12-18 DIAGNOSIS — I10 Essential (primary) hypertension: Secondary | ICD-10-CM | POA: Diagnosis not present

## 2020-12-18 DIAGNOSIS — E876 Hypokalemia: Secondary | ICD-10-CM | POA: Diagnosis not present

## 2020-12-18 LAB — MYOCARDIAL PERFUSION IMAGING
LV dias vol: 70 mL (ref 62–150)
LV sys vol: 14 mL
Nuc Stress EF: 80 %
Rest Nuclear Isotope Dose: 10.9 mCi
SDS: 4
SRS: 3
SSS: 7
ST Depression (mm): 0 mm
Stress Nuclear Isotope Dose: 32.9 mCi
TID: 1.02

## 2020-12-18 MED ORDER — TECHNETIUM TC 99M TETROFOSMIN IV KIT
10.9000 | PACK | Freq: Once | INTRAVENOUS | Status: AC | PRN
  Administered 2020-12-18: 10.9 via INTRAVENOUS
  Filled 2020-12-18: qty 11

## 2020-12-18 MED ORDER — TECHNETIUM TC 99M TETROFOSMIN IV KIT
32.9000 | PACK | Freq: Once | INTRAVENOUS | Status: AC | PRN
  Administered 2020-12-18: 32.9 via INTRAVENOUS
  Filled 2020-12-18: qty 33

## 2020-12-18 MED ORDER — REGADENOSON 0.4 MG/5ML IV SOLN
0.4000 mg | Freq: Once | INTRAVENOUS | Status: AC
  Administered 2020-12-18: 0.4 mg via INTRAVENOUS

## 2020-12-19 ENCOUNTER — Encounter (HOSPITAL_COMMUNITY): Payer: Medicare Other

## 2020-12-20 ENCOUNTER — Encounter (HOSPITAL_COMMUNITY): Payer: Self-pay | Admitting: Emergency Medicine

## 2020-12-20 ENCOUNTER — Inpatient Hospital Stay (HOSPITAL_COMMUNITY)
Admission: EM | Admit: 2020-12-20 | Discharge: 2020-12-27 | DRG: 602 | Disposition: A | Discharge status: Skilled Nursing Facility | Payer: Medicare Other | Attending: Internal Medicine | Admitting: Internal Medicine

## 2020-12-20 ENCOUNTER — Emergency Department (HOSPITAL_COMMUNITY): Payer: Medicare Other

## 2020-12-20 ENCOUNTER — Ambulatory Visit (HOSPITAL_COMMUNITY)
Admission: RE | Admit: 2020-12-20 | Discharge: 2020-12-20 | Disposition: A | Discharge status: Home / Self Care | Payer: Medicare Other | Source: Ambulatory Visit | Attending: Hematology & Oncology | Admitting: Hematology & Oncology

## 2020-12-20 ENCOUNTER — Other Ambulatory Visit: Payer: Self-pay

## 2020-12-20 ENCOUNTER — Inpatient Hospital Stay (HOSPITAL_COMMUNITY): Payer: Medicare Other

## 2020-12-20 DIAGNOSIS — R0682 Tachypnea, not elsewhere classified: Secondary | ICD-10-CM

## 2020-12-20 DIAGNOSIS — C4372 Malignant melanoma of left lower limb, including hip: Secondary | ICD-10-CM | POA: Diagnosis not present

## 2020-12-20 DIAGNOSIS — A498 Other bacterial infections of unspecified site: Secondary | ICD-10-CM

## 2020-12-20 DIAGNOSIS — Z880 Allergy status to penicillin: Secondary | ICD-10-CM

## 2020-12-20 DIAGNOSIS — S71102A Unspecified open wound, left thigh, initial encounter: Secondary | ICD-10-CM | POA: Diagnosis not present

## 2020-12-20 DIAGNOSIS — Z79899 Other long term (current) drug therapy: Secondary | ICD-10-CM

## 2020-12-20 DIAGNOSIS — Z808 Family history of malignant neoplasm of other organs or systems: Secondary | ICD-10-CM

## 2020-12-20 DIAGNOSIS — R17 Unspecified jaundice: Secondary | ICD-10-CM | POA: Diagnosis present

## 2020-12-20 DIAGNOSIS — Z85820 Personal history of malignant melanoma of skin: Secondary | ICD-10-CM | POA: Diagnosis not present

## 2020-12-20 DIAGNOSIS — G8929 Other chronic pain: Secondary | ICD-10-CM | POA: Diagnosis present

## 2020-12-20 DIAGNOSIS — M069 Rheumatoid arthritis, unspecified: Secondary | ICD-10-CM | POA: Diagnosis present

## 2020-12-20 DIAGNOSIS — Y92009 Unspecified place in unspecified non-institutional (private) residence as the place of occurrence of the external cause: Secondary | ICD-10-CM

## 2020-12-20 DIAGNOSIS — S0990XA Unspecified injury of head, initial encounter: Secondary | ICD-10-CM | POA: Diagnosis not present

## 2020-12-20 DIAGNOSIS — S8011XA Contusion of right lower leg, initial encounter: Secondary | ICD-10-CM | POA: Diagnosis present

## 2020-12-20 DIAGNOSIS — R531 Weakness: Secondary | ICD-10-CM | POA: Diagnosis not present

## 2020-12-20 DIAGNOSIS — D630 Anemia in neoplastic disease: Secondary | ICD-10-CM | POA: Diagnosis present

## 2020-12-20 DIAGNOSIS — R41841 Cognitive communication deficit: Secondary | ICD-10-CM | POA: Diagnosis not present

## 2020-12-20 DIAGNOSIS — E7800 Pure hypercholesterolemia, unspecified: Secondary | ICD-10-CM | POA: Diagnosis present

## 2020-12-20 DIAGNOSIS — M25522 Pain in left elbow: Secondary | ICD-10-CM | POA: Diagnosis not present

## 2020-12-20 DIAGNOSIS — R0902 Hypoxemia: Secondary | ICD-10-CM | POA: Diagnosis not present

## 2020-12-20 DIAGNOSIS — L89153 Pressure ulcer of sacral region, stage 3: Secondary | ICD-10-CM | POA: Diagnosis not present

## 2020-12-20 DIAGNOSIS — F32A Depression, unspecified: Secondary | ICD-10-CM | POA: Diagnosis present

## 2020-12-20 DIAGNOSIS — M160 Bilateral primary osteoarthritis of hip: Secondary | ICD-10-CM | POA: Diagnosis not present

## 2020-12-20 DIAGNOSIS — M79604 Pain in right leg: Secondary | ICD-10-CM | POA: Diagnosis not present

## 2020-12-20 DIAGNOSIS — I2510 Atherosclerotic heart disease of native coronary artery without angina pectoris: Secondary | ICD-10-CM | POA: Diagnosis present

## 2020-12-20 DIAGNOSIS — Z83438 Family history of other disorder of lipoprotein metabolism and other lipidemia: Secondary | ICD-10-CM

## 2020-12-20 DIAGNOSIS — D696 Thrombocytopenia, unspecified: Secondary | ICD-10-CM | POA: Diagnosis present

## 2020-12-20 DIAGNOSIS — Z86718 Personal history of other venous thrombosis and embolism: Secondary | ICD-10-CM | POA: Diagnosis not present

## 2020-12-20 DIAGNOSIS — Z881 Allergy status to other antibiotic agents status: Secondary | ICD-10-CM

## 2020-12-20 DIAGNOSIS — C439 Malignant melanoma of skin, unspecified: Secondary | ICD-10-CM | POA: Diagnosis not present

## 2020-12-20 DIAGNOSIS — R509 Fever, unspecified: Secondary | ICD-10-CM

## 2020-12-20 DIAGNOSIS — I739 Peripheral vascular disease, unspecified: Secondary | ICD-10-CM | POA: Diagnosis not present

## 2020-12-20 DIAGNOSIS — L03116 Cellulitis of left lower limb: Secondary | ICD-10-CM | POA: Diagnosis not present

## 2020-12-20 DIAGNOSIS — W19XXXA Unspecified fall, initial encounter: Secondary | ICD-10-CM

## 2020-12-20 DIAGNOSIS — L0390 Cellulitis, unspecified: Secondary | ICD-10-CM | POA: Diagnosis present

## 2020-12-20 DIAGNOSIS — I517 Cardiomegaly: Secondary | ICD-10-CM | POA: Diagnosis not present

## 2020-12-20 DIAGNOSIS — L02419 Cutaneous abscess of limb, unspecified: Secondary | ICD-10-CM | POA: Diagnosis not present

## 2020-12-20 DIAGNOSIS — Z981 Arthrodesis status: Secondary | ICD-10-CM

## 2020-12-20 DIAGNOSIS — B9689 Other specified bacterial agents as the cause of diseases classified elsewhere: Secondary | ICD-10-CM | POA: Diagnosis not present

## 2020-12-20 DIAGNOSIS — R5381 Other malaise: Secondary | ICD-10-CM | POA: Diagnosis not present

## 2020-12-20 DIAGNOSIS — Z86711 Personal history of pulmonary embolism: Secondary | ICD-10-CM

## 2020-12-20 DIAGNOSIS — J449 Chronic obstructive pulmonary disease, unspecified: Secondary | ICD-10-CM | POA: Diagnosis present

## 2020-12-20 DIAGNOSIS — B965 Pseudomonas (aeruginosa) (mallei) (pseudomallei) as the cause of diseases classified elsewhere: Secondary | ICD-10-CM | POA: Diagnosis present

## 2020-12-20 DIAGNOSIS — E876 Hypokalemia: Secondary | ICD-10-CM | POA: Diagnosis not present

## 2020-12-20 DIAGNOSIS — Z66 Do not resuscitate: Secondary | ICD-10-CM | POA: Diagnosis present

## 2020-12-20 DIAGNOSIS — S71102D Unspecified open wound, left thigh, subsequent encounter: Secondary | ICD-10-CM | POA: Diagnosis not present

## 2020-12-20 DIAGNOSIS — I252 Old myocardial infarction: Secondary | ICD-10-CM

## 2020-12-20 DIAGNOSIS — I69828 Other speech and language deficits following other cerebrovascular disease: Secondary | ICD-10-CM | POA: Diagnosis not present

## 2020-12-20 DIAGNOSIS — F329 Major depressive disorder, single episode, unspecified: Secondary | ICD-10-CM | POA: Diagnosis not present

## 2020-12-20 DIAGNOSIS — R410 Disorientation, unspecified: Secondary | ICD-10-CM | POA: Diagnosis not present

## 2020-12-20 DIAGNOSIS — I480 Paroxysmal atrial fibrillation: Secondary | ICD-10-CM | POA: Diagnosis present

## 2020-12-20 DIAGNOSIS — Z96653 Presence of artificial knee joint, bilateral: Secondary | ICD-10-CM | POA: Diagnosis present

## 2020-12-20 DIAGNOSIS — W1809XA Striking against other object with subsequent fall, initial encounter: Secondary | ICD-10-CM | POA: Diagnosis present

## 2020-12-20 DIAGNOSIS — L03314 Cellulitis of groin: Secondary | ICD-10-CM | POA: Diagnosis present

## 2020-12-20 DIAGNOSIS — Z20822 Contact with and (suspected) exposure to covid-19: Secondary | ICD-10-CM | POA: Diagnosis not present

## 2020-12-20 DIAGNOSIS — Z8249 Family history of ischemic heart disease and other diseases of the circulatory system: Secondary | ICD-10-CM

## 2020-12-20 DIAGNOSIS — R6510 Systemic inflammatory response syndrome (SIRS) of non-infectious origin without acute organ dysfunction: Secondary | ICD-10-CM | POA: Diagnosis present

## 2020-12-20 DIAGNOSIS — I10 Essential (primary) hypertension: Secondary | ICD-10-CM | POA: Diagnosis not present

## 2020-12-20 DIAGNOSIS — D649 Anemia, unspecified: Secondary | ICD-10-CM | POA: Diagnosis not present

## 2020-12-20 DIAGNOSIS — E860 Dehydration: Secondary | ICD-10-CM

## 2020-12-20 DIAGNOSIS — S199XXA Unspecified injury of neck, initial encounter: Secondary | ICD-10-CM | POA: Diagnosis not present

## 2020-12-20 DIAGNOSIS — R2681 Unsteadiness on feet: Secondary | ICD-10-CM | POA: Diagnosis not present

## 2020-12-20 DIAGNOSIS — S71109A Unspecified open wound, unspecified thigh, initial encounter: Secondary | ICD-10-CM

## 2020-12-20 DIAGNOSIS — M79671 Pain in right foot: Secondary | ICD-10-CM | POA: Diagnosis not present

## 2020-12-20 DIAGNOSIS — L89323 Pressure ulcer of left buttock, stage 3: Secondary | ICD-10-CM | POA: Diagnosis present

## 2020-12-20 DIAGNOSIS — Z8261 Family history of arthritis: Secondary | ICD-10-CM

## 2020-12-20 DIAGNOSIS — Z889 Allergy status to unspecified drugs, medicaments and biological substances status: Secondary | ICD-10-CM | POA: Diagnosis not present

## 2020-12-20 DIAGNOSIS — Z885 Allergy status to narcotic agent status: Secondary | ICD-10-CM

## 2020-12-20 DIAGNOSIS — F1020 Alcohol dependence, uncomplicated: Secondary | ICD-10-CM | POA: Diagnosis present

## 2020-12-20 DIAGNOSIS — I890 Lymphedema, not elsewhere classified: Secondary | ICD-10-CM | POA: Diagnosis present

## 2020-12-20 DIAGNOSIS — M4319 Spondylolisthesis, multiple sites in spine: Secondary | ICD-10-CM | POA: Diagnosis not present

## 2020-12-20 DIAGNOSIS — L03115 Cellulitis of right lower limb: Principal | ICD-10-CM | POA: Diagnosis present

## 2020-12-20 DIAGNOSIS — Z7901 Long term (current) use of anticoagulants: Secondary | ICD-10-CM

## 2020-12-20 DIAGNOSIS — M549 Dorsalgia, unspecified: Secondary | ICD-10-CM | POA: Diagnosis not present

## 2020-12-20 DIAGNOSIS — S8011XD Contusion of right lower leg, subsequent encounter: Secondary | ICD-10-CM | POA: Diagnosis not present

## 2020-12-20 DIAGNOSIS — Z96611 Presence of right artificial shoulder joint: Secondary | ICD-10-CM | POA: Diagnosis present

## 2020-12-20 DIAGNOSIS — R9082 White matter disease, unspecified: Secondary | ICD-10-CM | POA: Diagnosis not present

## 2020-12-20 DIAGNOSIS — K219 Gastro-esophageal reflux disease without esophagitis: Secondary | ICD-10-CM | POA: Diagnosis present

## 2020-12-20 DIAGNOSIS — E43 Unspecified severe protein-calorie malnutrition: Secondary | ICD-10-CM | POA: Diagnosis not present

## 2020-12-20 DIAGNOSIS — F419 Anxiety disorder, unspecified: Secondary | ICD-10-CM | POA: Diagnosis present

## 2020-12-20 DIAGNOSIS — I509 Heart failure, unspecified: Secondary | ICD-10-CM | POA: Diagnosis not present

## 2020-12-20 DIAGNOSIS — Z743 Need for continuous supervision: Secondary | ICD-10-CM | POA: Diagnosis not present

## 2020-12-20 DIAGNOSIS — M6281 Muscle weakness (generalized): Secondary | ICD-10-CM | POA: Diagnosis not present

## 2020-12-20 DIAGNOSIS — Z923 Personal history of irradiation: Secondary | ICD-10-CM

## 2020-12-20 DIAGNOSIS — Z955 Presence of coronary angioplasty implant and graft: Secondary | ICD-10-CM

## 2020-12-20 DIAGNOSIS — Z9221 Personal history of antineoplastic chemotherapy: Secondary | ICD-10-CM

## 2020-12-20 DIAGNOSIS — L7682 Other postprocedural complications of skin and subcutaneous tissue: Secondary | ICD-10-CM | POA: Diagnosis not present

## 2020-12-20 DIAGNOSIS — Z96612 Presence of left artificial shoulder joint: Secondary | ICD-10-CM | POA: Diagnosis present

## 2020-12-20 DIAGNOSIS — G629 Polyneuropathy, unspecified: Secondary | ICD-10-CM | POA: Diagnosis present

## 2020-12-20 DIAGNOSIS — L8990 Pressure ulcer of unspecified site, unspecified stage: Secondary | ICD-10-CM | POA: Insufficient documentation

## 2020-12-20 DIAGNOSIS — Z9181 History of falling: Secondary | ICD-10-CM | POA: Diagnosis not present

## 2020-12-20 LAB — URINALYSIS, MICROSCOPIC (REFLEX)

## 2020-12-20 LAB — COMPREHENSIVE METABOLIC PANEL
ALT: 19 U/L (ref 0–44)
ALT: 19 U/L (ref 0–44)
AST: 26 U/L (ref 15–41)
AST: 26 U/L (ref 15–41)
Albumin: 2.9 g/dL — ABNORMAL LOW (ref 3.5–5.0)
Albumin: 2.9 g/dL — ABNORMAL LOW (ref 3.5–5.0)
Alkaline Phosphatase: 47 U/L (ref 38–126)
Alkaline Phosphatase: 45 U/L (ref 38–126)
Anion gap: 10 (ref 5–15)
Anion gap: 13 (ref 5–15)
BUN: 30 mg/dL — ABNORMAL HIGH (ref 8–23)
BUN: 38 mg/dL — ABNORMAL HIGH (ref 8–23)
CO2: 37 mmol/L — ABNORMAL HIGH (ref 22–32)
CO2: 32 mmol/L (ref 22–32)
Calcium: 8.4 mg/dL — ABNORMAL LOW (ref 8.9–10.3)
Calcium: 7.7 mg/dL — ABNORMAL LOW (ref 8.9–10.3)
Chloride: 95 mmol/L — ABNORMAL LOW (ref 98–111)
Chloride: 99 mmol/L (ref 98–111)
Creatinine, Ser: 0.98 mg/dL (ref 0.61–1.24)
Creatinine, Ser: 1.06 mg/dL (ref 0.61–1.24)
GFR, Estimated: >60 mL/min (ref >60)
GFR, Estimated: >60 mL/min (ref >60)
Glucose, Bld: 99 mg/dL (ref 70–99)
Glucose, Bld: 86 mg/dL (ref 70–99)
Potassium: 2.4 mmol/L — CL (ref 3.5–5.1)
Potassium: 2.6 mmol/L — CL (ref 3.5–5.1)
Sodium: 142 mmol/L (ref 135–145)
Sodium: 144 mmol/L (ref 135–145)
Total Bilirubin: 2 mg/dL — ABNORMAL HIGH (ref 0.3–1.2)
Total Bilirubin: 1.5 mg/dL — ABNORMAL HIGH (ref 0.3–1.2)
Total Protein: 5.6 g/dL — ABNORMAL LOW (ref 6.5–8.1)
Total Protein: 5.3 g/dL — ABNORMAL LOW (ref 6.5–8.1)

## 2020-12-20 LAB — CBC
HCT: 35.5 % — ABNORMAL LOW (ref 39.0–52.0)
Hemoglobin: 10.7 g/dL — ABNORMAL LOW (ref 13.0–17.0)
MCH: 30.4 pg (ref 26.0–34.0)
MCHC: 30.1 g/dL (ref 30.0–36.0)
MCV: 100.9 fL — ABNORMAL HIGH (ref 80.0–100.0)
Platelets: 114 10*3/uL — ABNORMAL LOW (ref 150–400)
RBC: 3.52 MIL/uL — ABNORMAL LOW (ref 4.22–5.81)
RDW: 13.7 % (ref 11.5–15.5)
WBC: 12.7 10*3/uL — ABNORMAL HIGH (ref 4.0–10.5)
nRBC: 0 % (ref 0.0–0.2)

## 2020-12-20 LAB — CBC WITH DIFFERENTIAL/PLATELET
Abs Immature Granulocytes: 0.11 10*3/uL — ABNORMAL HIGH (ref 0.00–0.07)
Basophils Absolute: 0 10*3/uL (ref 0.0–0.1)
Basophils Relative: 0 %
Eosinophils Absolute: 0 10*3/uL (ref 0.0–0.5)
Eosinophils Relative: 0 %
HCT: 37 % — ABNORMAL LOW (ref 39.0–52.0)
Hemoglobin: 11.4 g/dL — ABNORMAL LOW (ref 13.0–17.0)
Immature Granulocytes: 1 %
Lymphocytes Relative: 4 %
Lymphs Abs: 0.6 10*3/uL — ABNORMAL LOW (ref 0.7–4.0)
MCH: 30.1 pg (ref 26.0–34.0)
MCHC: 30.8 g/dL (ref 30.0–36.0)
MCV: 97.6 fL (ref 80.0–100.0)
Monocytes Absolute: 1.2 10*3/uL — ABNORMAL HIGH (ref 0.1–1.0)
Monocytes Relative: 7 %
Neutro Abs: 14.3 10*3/uL — ABNORMAL HIGH (ref 1.7–7.7)
Neutrophils Relative %: 88 %
Platelets: 131 10*3/uL — ABNORMAL LOW (ref 150–400)
RBC: 3.79 MIL/uL — ABNORMAL LOW (ref 4.22–5.81)
RDW: 13.6 % (ref 11.5–15.5)
WBC: 16.1 10*3/uL — ABNORMAL HIGH (ref 4.0–10.5)
nRBC: 0 % (ref 0.0–0.2)

## 2020-12-20 LAB — URINALYSIS, ROUTINE W REFLEX MICROSCOPIC
Bilirubin Urine: NEGATIVE
Glucose, UA: NEGATIVE mg/dL
Ketones, ur: 5 mg/dL — AB
Leukocytes,Ua: NEGATIVE
Nitrite: NEGATIVE
Protein, ur: NEGATIVE mg/dL
Specific Gravity, Urine: 1.02 (ref 1.005–1.030)
pH: 6 (ref 5.0–8.0)

## 2020-12-20 LAB — MAGNESIUM: Magnesium: 2.7 mg/dL — ABNORMAL HIGH (ref 1.7–2.4)

## 2020-12-20 LAB — LACTIC ACID, PLASMA
Lactic Acid, Venous: 1 mmol/L (ref 0.5–1.9)
Lactic Acid, Venous: 0.8 mmol/L (ref 0.5–1.9)

## 2020-12-20 LAB — PROTIME-INR
INR: 2 — ABNORMAL HIGH (ref 0.8–1.2)
Prothrombin Time: 22.3 seconds — ABNORMAL HIGH (ref 11.4–15.2)

## 2020-12-20 LAB — RESP PANEL BY RT-PCR (FLU A&B, COVID) ARPGX2
Influenza A by PCR: NEGATIVE
Influenza B by PCR: NEGATIVE
SARS Coronavirus 2 by RT PCR: NEGATIVE

## 2020-12-20 MED ORDER — HYDROMORPHONE HCL 2 MG PO TABS
2.0000 mg | ORAL_TABLET | Freq: Once | ORAL | Status: AC
  Administered 2020-12-21: 2 mg via ORAL
  Filled 2020-12-20: qty 1

## 2020-12-20 MED ORDER — VANCOMYCIN HCL 1500 MG/300ML IV SOLN
1500.0000 mg | Freq: Once | INTRAVENOUS | Status: AC
  Administered 2020-12-20: 1500 mg via INTRAVENOUS
  Filled 2020-12-20: qty 300

## 2020-12-20 MED ORDER — ALPRAZOLAM 0.5 MG PO TABS
0.5000 mg | ORAL_TABLET | Freq: Three times a day (TID) | ORAL | Status: DC | PRN
  Administered 2020-12-20 – 2020-12-27 (×10): 0.5 mg via ORAL
  Filled 2020-12-20 (×10): qty 1

## 2020-12-20 MED ORDER — POTASSIUM CHLORIDE 10 MEQ/100ML IV SOLN
10.0000 meq | INTRAVENOUS | Status: AC
  Administered 2020-12-20 – 2020-12-21 (×5): 10 meq via INTRAVENOUS
  Filled 2020-12-20 (×5): qty 100

## 2020-12-20 MED ORDER — ACETAMINOPHEN 650 MG RE SUPP
650.0000 mg | Freq: Once | RECTAL | Status: AC
  Administered 2020-12-20: 650 mg via RECTAL
  Filled 2020-12-20: qty 1

## 2020-12-20 MED ORDER — FLUOXETINE HCL 40 MG PO CAPS: 40.0000 mg | ORAL_CAPSULE | Freq: Every day | ORAL | Status: DC

## 2020-12-20 MED ORDER — ALBUTEROL SULFATE HFA 108 (90 BASE) MCG/ACT IN AERS: 1.0000 | INHALATION_SPRAY | RESPIRATORY_TRACT | Status: DC | PRN

## 2020-12-20 MED ORDER — GABAPENTIN 300 MG PO CAPS
600.0000 mg | ORAL_CAPSULE | Freq: Every day | ORAL | Status: DC
  Administered 2020-12-20 – 2020-12-26 (×7): 600 mg via ORAL
  Filled 2020-12-20 (×7): qty 2

## 2020-12-20 MED ORDER — SODIUM CHLORIDE 0.9 % IV SOLN: INTRAVENOUS | Status: DC

## 2020-12-20 MED ORDER — APIXABAN 5 MG PO TABS
5.0000 mg | ORAL_TABLET | Freq: Two times a day (BID) | ORAL | Status: DC
  Administered 2020-12-20 – 2020-12-27 (×14): 5 mg via ORAL
  Filled 2020-12-20 (×14): qty 1

## 2020-12-20 MED ORDER — POTASSIUM CHLORIDE CRYS ER 20 MEQ PO TBCR
40.0000 meq | EXTENDED_RELEASE_TABLET | Freq: Two times a day (BID) | ORAL | Status: AC
  Administered 2020-12-20 – 2020-12-21 (×2): 40 meq via ORAL
  Filled 2020-12-20 (×2): qty 2

## 2020-12-20 MED ORDER — ALBUTEROL SULFATE (2.5 MG/3ML) 0.083% IN NEBU: 2.5000 mg | INHALATION_SOLUTION | RESPIRATORY_TRACT | Status: DC | PRN

## 2020-12-20 MED ORDER — HYDROMORPHONE HCL 1 MG/ML IJ SOLN
0.5000 mg | Freq: Once | INTRAMUSCULAR | Status: AC
Start: 2020-12-20 — End: 2020-12-20
  Administered 2020-12-20: 0.5 mg via INTRAVENOUS
  Filled 2020-12-20: qty 1

## 2020-12-20 MED ORDER — VANCOMYCIN HCL 1250 MG/250ML IV SOLN
1250.0000 mg | INTRAVENOUS | Status: DC
  Administered 2020-12-21 – 2020-12-24 (×4): 1250 mg via INTRAVENOUS
  Filled 2020-12-20 (×6): qty 250

## 2020-12-20 MED ORDER — POTASSIUM CHLORIDE 10 MEQ/100ML IV SOLN
10.0000 meq | Freq: Once | INTRAVENOUS | Status: AC
  Administered 2020-12-20: 10 meq via INTRAVENOUS
  Filled 2020-12-20: qty 100

## 2020-12-20 MED ORDER — ONDANSETRON HCL 4 MG/2ML IJ SOLN
4.0000 mg | Freq: Once | INTRAMUSCULAR | Status: AC
  Administered 2020-12-20: 4 mg via INTRAVENOUS
  Filled 2020-12-20: qty 2

## 2020-12-20 MED ORDER — POLYVINYL ALCOHOL 1.4 % OP SOLN
1.0000 [drp] | OPHTHALMIC | Status: DC | PRN
  Filled 2020-12-20: qty 15

## 2020-12-20 MED ORDER — FLUOXETINE HCL 20 MG PO CAPS
40.0000 mg | ORAL_CAPSULE | Freq: Every day | ORAL | Status: DC
  Administered 2020-12-20 – 2020-12-25 (×6): 40 mg via ORAL
  Filled 2020-12-20 (×6): qty 2

## 2020-12-20 MED ORDER — PREDNISONE 5 MG PO TABS
15.0000 mg | ORAL_TABLET | Freq: Every day | ORAL | Status: DC
  Administered 2020-12-21 – 2020-12-27 (×7): 15 mg via ORAL
  Filled 2020-12-20 (×7): qty 1

## 2020-12-20 MED ORDER — MELATONIN 5 MG PO TABS
10.0000 mg | ORAL_TABLET | Freq: Every day | ORAL | Status: DC
  Administered 2020-12-20 – 2020-12-26 (×7): 10 mg via ORAL
  Filled 2020-12-20 (×7): qty 2

## 2020-12-20 MED ORDER — MELATONIN 10 MG PO TABS: 10.0000 mg | ORAL_TABLET | Freq: Every day | ORAL | Status: DC

## 2020-12-20 NOTE — ED Provider Notes (Signed)
[facility] DEPT Provider Note   CSN: [id number] Arrival date & time: 12/20/20  0843     History Chief Complaint  Patient presents with   Fall   Back Pain   Leg Pain    [name] is a 75 y.o. male on [place] with history of malignant melanoma receiving radiation presents the ED via EMS from [place] for unwitnessed fall between 2100 yesterday and 0800 today.  The patient reports that he was coming back from the bathroom when he tripped over his bathroom rug, lost his footing, leaned back on a wall and landed on his bottom.  He reports hitting his left elbow on the ground, but denies any head injury. Currently, he reports lower back pain that is chronic for him, elbow pain, and neck pain.  EMS placed c-collar and reported stable vital signs.  The patient is A&O x4, but is not the best historian for his medical history or chronicity of symptoms.   Fall Associated symptoms include shortness of breath. Pertinent negatives include no abdominal pain and no headaches.  Back Pain Associated symptoms: leg pain   Associated symptoms: no abdominal pain, no dysuria, no fever, no headaches and no weakness   Leg Pain Associated symptoms: back pain and neck pain   Associated symptoms: no fever       Past Medical History:  Diagnosis Date   Alcoholism /alcohol abuse    per family   Allergic rhinitis    Allergy    Anxiety    Bacterial infection    CAD (coronary artery disease)    minimal coronary plaque in the LAD and right coronary system. 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 7-06. Last nuclear stress 11-17-06 fixed anterior/ inferior defect, no inducible ischemia, EF 81%   Cataract    CHF (congestive heart failure) (HCC)    Chronic back pain    "all over back"   Chronic neck pain    COPD (chronic obstructive pulmonary disease) ([place])    Depression    Diverticulosis     DVT (deep venous thrombosis) ([place]) 06/2020   Dyspnea    with exertion   Dysrhythmia 01/24/2012   past hx. A.Fib x1 episode-responded to med.   Falls frequently    "since 02/2013" (06/16/2013)   Family history of cancer    Genetic testing 03/06/2017   Multi-Cancer panel (83 genes) @ [facility] - No pathogenic mutations detected   GERD (gastroesophageal reflux disease)    Goals of care, counseling/discussion 10/12/2020   Hemochromatosis    dx'd 14 yrs ago last ferritin Aug 11, 08 52 (22-322), Fe 136 ("I had 250 phlebotomies for that")   High cholesterol    hx   History of radiation therapy 11/06/2020   left inguinal thigh 10/10/2020-11/06/2020  Dr Gery Pray   Hx of colonic polyps    Hx of colonoscopy    Hypertension    Iron deficiency anemia due to chronic blood loss 12/27/2016   Malignant melanoma of knee, left (HCC)    Myocardial infarction [facility]) 2006   "related to catheterization"   Narcotic abuse ([place])    per family   Neuropathy    fingers and toes   Osteoarthritis    Peripheral edema    Bilateral ankles   PONV (postoperative nausea and vomiting)    Pulmonary embolism ([place]) 06/2020   RA (rheumatoid arthritis) [facility])     Patient Active Problem List  Diagnosis Date Noted   Cellulitis 12/20/2020   Blisters of multiple sites 11/05/2020   Goals of care, counseling/discussion 10/12/2020   Atherosclerosis of aorta ([place]) 07/24/2020   Symptomatic anemia 05/27/2020   Pulmonary embolus ([place]) 05/27/2020   Acute respiratory failure with hypoxia ([place]) 05/26/2020   Callus of foot 04/21/2018   Edema 03/27/2018   Toxic gastroenteritis and colitis 03/25/2018   Other ulcerative 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 ([place]) 42/59/5638   Diarrhea 12/23/2017   Nausea & vomiting 12/23/2017   Herpes zoster 07/07/2017   Constipation 04/21/2017   Genetic testing 03/06/2017   Family history of cancer    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[place]) 02/12/2017   Iron deficiency anemia due to chronic blood  loss 12/27/2016   Scalp wound 06/13/2016   Scalp laceration, subsequent encounter 06/04/2016   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   Pain in joint, ankle and foot 09/28/2015   Anemia due to chronic blood loss 09/28/2015   Hyponatremia 09/28/2015   Cramp in limb 09/28/2015   Exertional angina ([place]) 06/17/2014   DOE (dyspnea on exertion) 04/28/2014   Hypokalemia 03/11/2014   Counseling regarding goals of care 12/31/2013   Impaired glucose tolerance 12/31/2013   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[place]) 11/18/2013   Fever 11/18/2013   Leukocytosis 10/30/2013   Fatigue 10/22/2013   Hoarseness of voice 08/10/2013   Sleep disorder 07/05/2013   Tension headache 07/01/2013   Polypharmacy 06/19/2013   Lumbar post-laminectomy syndrome 05/27/2013   Degenerative arthritis of spine 05/26/2013   Hemochromatosis 05/26/2013   Wound infection after surgery 05/26/2013   Normocytic anemia 05/26/2013   Unintentional weight loss 05/26/2013   Protein-calorie malnutrition, severe ([place]) 05/25/2013   Chronic pain syndrome 05/23/2013   COPD (chronic obstructive pulmonary disease) ([place]) 05/06/2013   Frequent falls 05/06/2013   Encounter for postoperative wound care 05/06/2013   Atrial fibrillation ([place]) 08/07/2010   Rheumatoid arthritis ([place]) 10/18/2008   Anxiety 11/09/2007   Elevated lipids 03/30/2007   PSEUDOGOUT 03/30/2007   Essential hypertension 03/30/2007   Coronary atherosclerosis 03/30/2007    Past Surgical History:  Procedure Laterality Date   ABDOMINAL ADHESION SURGERY  ~ [place] Right 6-09   [facility]   APPENDECTOMY  ~ [place] Left 03/27/2017   Procedure: ASPIRATION OF SEROMA;  Surgeon: Stark Klein, MD;  Location: [facility];  Service: General;  Laterality: Left;   BONE TUMOR RESECTION  ~ 1954   "taken off my mastoid"   CARPAL TUNNEL RELEASE Right 1990's   CATARACT EXTRACTION, BILATERAL Bilateral 01-24-12   CORONARY ANGIOPLASTY  WITH STENT PLACEMENT  2006   "while repairing 1st stent, a second area tore and they had to place 2nd stent " ?LAD & CX   CORONARY ANGIOPLASTY WITH STENT PLACEMENT  06/17/2014   CYST EXCISION  "several OR's"   "backX 2, back of my neck, face, inside right bicept, chest, wrist"   FOOT SURGERY Right 11-08   for removal of bone spurs-   HAMMER TOE SURGERY Right 07/2012   "broke 4 hammertoes"    HARDWARE REMOVAL  03/09/2012   Procedure: HARDWARE REMOVAL;  Surgeon: Nita Sells, MD;  Location: [facility];  Service: Orthopedics;  Laterality: Left;  Hardware Removal from Left Shoulder   HARVEST BONE GRAFT  02/06/2012   Procedure: HARVEST ILIAC BONE GRAFT;  Surgeon: Nita Sells, MD;  Location[facility];  Service: Orthopedics;;  bone marrow aspirqation    INGUINAL HERNIA REPAIR Bilateral  JOINT REPLACEMENT     KNEE SURGERY Left ~ 2003   "6-12 months after uni knee removed synovial sack"   LEFT HEART CATHETERIZATION WITH CORONARY ANGIOGRAM N/A 06/17/2014   PCI of diffuse severe stenosis in the proximal to mid LAD using overlapping drug-eluting stents.   LUMBAR WOUND DEBRIDEMENT N/A 03/17/2013   Procedure: Incision and drainage of superficial lumbar wound;  Surgeon: Floyce Stakes, MD;  Location: [facility];  Service: Neurosurgery;  Laterality: N/A;  Incision and drainage of superficial lumbar wound   LYMPH NODE BIOPSY N/A 08/15/2020   Procedure: EXCISIONAL LYMPH NODE BIOPSY LEFT INGUINAL REGION, EXCISION OF LEFT THIGH NODULE, EXCISION OF LEFT ABDOMINAL WALL LESION;  Surgeon: Stark Klein, MD;  Location[facility];  Service: General;  Laterality: N/A;   MECKEL DIVERTICULUM EXCISION  ~ [place] LYMPH NODE BIOPSY Left 02/12/2017   WIDE LOCAL EXCISION LEFT KNEE MELANOMA, ADVANCEMENT FLAP CLOSURE, AND SENTINEL LYMPH NODE MAPPING AND BIOPSY.   MELANOMA EXCISION WITH SENTINEL LYMPH NODE BIOPSY Left 02/12/2017   Procedure: WIDE LOCAL EXCISION  LEFT KNEE MELANOMA, ADVANCEMENT FLAP CLOSURE, AND SENTINEL LYMPH NODE MAPPING AND BIOPSY.;  Surgeon: Stark Klein, MD;  Location: [facility];  Service: General;  Laterality: Left;  GENERAL AND LOCAL   ORIF SHOULDER FRACTURE  02/06/2012   Procedure: OPEN REDUCTION INTERNAL FIXATION (ORIF) SHOULDER FRACTURE;  Surgeon: Nita Sells, MD;  [facility];  Service: Orthopedics;  Laterality: Left;  ORIF of a Left Shoulder Fracture with  Iliac Crest Bone Graft aspiration    PORTACATH PLACEMENT Left 03/27/2017   Procedure: INSERTION PORT-A-CATH;  Surgeon: Stark Klein, MD;  Location: [facility];  Service: General;  Laterality: Left;   POSTERIOR LUMBAR FUSION  12-10   L4-5 diskectomy w/ fusion, cage placement and rods; [facility]   POSTERIOR LUMBAR FUSION 4 LEVEL N/A 03/02/2013   Procedure: Lumbar One to Sacral One Posterior lumbar interbody fusion;  Surgeon: Floyce Stakes, MD;  Location: [facility];  Service: Neurosurgery;  Laterality: N/A;  L1 to S1 Posterior lumbar interbody fusion   REPLACEMENT UNICONDYLAR JOINT KNEE Left ~ 2003   "~ 6 months after total knee replaced"   SHOULDER ARTHROSCOPY Left ~ 2004 X 2   "@ [facility]; left bone splinter in & had to clean it out"   TOTAL ANKLE REPLACEMENT Right 2008   at [place] Bilateral 2002   TOTAL SHOULDER REPLACEMENT Left 2006   TOTAL SHOULDER REPLACEMENT Right ~ 2007   Dr. Marlou Sa       Family History  Problem Relation Age of Onset   Uterine cancer Mother        uterine vs. cervix? dx 59s; deceased 57   Macular degeneration Mother    Other Mother        ankle edema   Lung cancer Mother    Melanoma Mother        multiple spots on legs   Cancer - Lung Mother        smoker   Coronary artery disease Father    Hypertension Father    Prostate cancer Father    Colon polyps Father    Cancer - Prostate Father 22       deceased 5   Heart attack Brother    Hyperlipidemia Brother    Other Brother         Schizophrenic   Thyroid disease Sister    Hemochromatosis Sister  Rheum arthritis Sister    Coronary artery disease Maternal Aunt    Heart attack Maternal Aunt    Cancer Maternal Grandmother        unk. type; deceased 31s   Diabetes Neg Hx    Colon cancer Neg Hx    Esophageal cancer Neg Hx    Rectal cancer Neg Hx    Stomach cancer Neg Hx     Social History   Tobacco Use   Smoking status: Never   Smokeless tobacco: Never  Vaping Use   Vaping Use: Never used  Substance Use Topics   Alcohol use: No    Alcohol/week: 0.0 standard drinks    Comment: "I do not drink anymore"   Drug use: No    Home Medications Prior to Admission medications   Medication Sig Start Date End Date Taking? Authorizing Provider  albuterol (PROVENTIL HFA;VENTOLIN HFA) 108 (90 Base) MCG/ACT inhaler Inhale 1-2 puffs into the lungs every 4 (four) hours as needed for wheezing or shortness of breath. 09/11/16  Yes Plotnikov, Evie Lacks, MD  ALPRAZolam Duanne Moron) 0.5 MG tablet Take 1 tablet (0.5 mg total) by mouth 3 (three) times daily as needed for anxiety. for anxiety 10/10/20  Yes Plotnikov, Evie Lacks, MD  apixaban (ELIQUIS) 5 MG TABS tablet Take 1 tablet (5 mg total) by mouth 2 (two) times daily. 06/06/20 12/20/20 Yes Plotnikov, Evie Lacks, MD  Cholecalciferol (VITAMIN D3) 2000 UNITS capsule Take 1 capsule (2,000 Units total) by mouth daily. 12/05/14  Yes Plotnikov, Evie Lacks, MD  FLUoxetine (PROZAC) 40 MG capsule Take 40 mg by mouth daily. 11/07/20  Yes [provider]  fluticasone (FLONASE) 50 MCG/ACT nasal spray INSTILL 2 SPRAYS IN EACH NOSTRIL EVERY DAY Patient taking differently: Place 2 sprays into both nostrils daily as needed for allergies. 11/19/17  Yes Plotnikov, Evie Lacks, MD  gabapentin (NEURONTIN) 300 MG capsule Take 2 capsules (600 mg total) by mouth at bedtime. 11/02/20  Yes Plotnikov, Evie Lacks, MD  HYDROmorphone (DILAUDID) 2 MG tablet Take 1-1.5 tablets (2-3 mg total) by mouth every 8 (eight) hours  as needed for severe pain. 10/10/20  Yes Plotnikov, Evie Lacks, MD  lidocaine-prilocaine (EMLA) cream Apply 1 application topically as needed. 05/25/20  Yes Celso Amy, NP  Melatonin 10 MG TABS Take 10 mg by mouth at bedtime.   Yes [provider]  metoprolol tartrate (LOPRESSOR) 25 MG tablet TAKE 1 TABLET TWO TIMES DAILY. Patient taking differently: Take 25 mg by mouth 2 (two) times daily. 11/06/20  Yes Plotnikov, Evie Lacks, MD  mupirocin ointment (BACTROBAN) 2 % On leg wound w/dressing change qd or bid 11/02/20  Yes Plotnikov, Evie Lacks, MD  pantoprazole (PROTONIX) 40 MG tablet Take 1 tablet (40 mg total) by mouth every evening. Patient taking differently: Take 40 mg by mouth daily as needed (indigestion). 06/19/20  Yes Plotnikov, Evie Lacks, MD  polyvinyl alcohol (LIQUIFILM TEARS) 1.4 % ophthalmic solution Place 1 drop into both eyes as needed for dry eyes.   Yes [provider]  predniSONE (DELTASONE) 5 MG tablet Take 15 mg by mouth daily. 06/19/20  Yes [provider]  Pyridoxine HCl (B-6 PO) Take 1 tablet by mouth daily.   Yes [provider]  temazepam (RESTORIL) 15 MG capsule Take 1 capsule (15 mg total) by mouth at bedtime as needed for sleep. 10/12/20  Yes [name], MD  torsemide (DEMADEX) 100 MG tablet Take 0.5-1 tablets (50-100 mg total) by mouth daily as needed. Patient taking  differently: Take 50-100 mg by mouth daily as needed (fluid). 07/05/20  Yes Plotnikov, Evie Lacks, MD  vitamin B-12 (CYANOCOBALAMIN) 1000 MCG tablet Take 1,000 mcg by mouth daily.    Yes [provider]  sulfamethoxazole-trimethoprim (BACTRIM DS) 800-160 MG tablet Take 1 tablet by mouth 2 (two) times daily. Patient not taking: Reported on 12/20/2020 11/24/20   Gery Pray, MD    Allergies    Cefepime, Cephaeline, Morphine, Morphine and related, Penicillins, Doxycycline, and Oxycodone-acetaminophen  Review of Systems   Review of Systems  Constitutional:  Negative  for chills and fever.  Respiratory:  Positive for shortness of breath. Negative for cough.   Gastrointestinal:  Negative for abdominal pain, constipation, diarrhea, nausea and vomiting.  Genitourinary:  Negative for dysuria and hematuria.  Musculoskeletal:  Positive for back pain, myalgias and neck pain.  Skin:  Positive for color change and wound.  Neurological:  Negative for dizziness, syncope, weakness and headaches.   Physical Exam Updated Vital Signs BP 96/60   Pulse 84   Temp (!) 100.8 F (38.2 C) (Rectal)   Resp 12   SpO2 94%   Physical Exam Vitals and nursing note reviewed.  Constitutional:      General: He is not in acute distress.    Comments: Cachetic   HENT:     Head: Normocephalic and atraumatic.     Mouth/Throat:     Mouth: Mucous membranes are dry.  Eyes:     General: No scleral icterus.    Extraocular Movements: Extraocular movements intact.     Pupils: Pupils are equal, round, and reactive to light.  Neck:     Comments: C- Collar in place.   Re-eval after cleared CT - no midline tenderness, no abrasions noted.  Cardiovascular:     Rate and Rhythm: Normal rate and regular rhythm.  Pulmonary:     Effort: Pulmonary effort is normal. No respiratory distress.     Breath sounds: No wheezing.     Comments: Tachypenic Decreased breath sounds in bilateral bases Abdominal:     General: Abdomen is flat. Bowel sounds are normal.     Palpations: Abdomen is soft.     Tenderness: There is no guarding or rebound.     Comments: Multiple old surgical scars on abdomen, patient does not remember what some of them are from. No overlying skin changes, rashes, or contusions seen on abdomen.   Musculoskeletal:        General: Swelling and tenderness present. No deformity.     Right lower leg: Edema present.     Left lower leg: Edema present.     Comments: No midline or paraspinal tenderness.  Patient reports left lower flank tenderness to palpation.  No CVA  tenderness.  Patient has contusion to his left hip/buttock area.  No abrasions or skin tears noted. Mild tender to palpation.  Skin:    General: Skin is warm and dry.     Comments: Patient's incision site for lymphadenectomy at the left groin has mixed serous and purlent discharge from the wound with surround induration. No fluctuance.   Left inner thigh has a quarter sized blister with surrounding warmth, induration, and erythema.No fluctuance. No streaking noted.   Left lower leg shows lymphedema with warmth and erythema. Shallow ulceration seen to the back of the patient's right leg.   Large, soft hematoma of unknown origin or duration located to the patient's right shin with surrounding ecchymosis.    Neurological:     General: No  focal deficit present.     Mental Status: He is oriented to person, place, and time.     Sensory: No sensory deficit.    ED Results / Procedures / Treatments   Labs (all labs ordered are listed, but only abnormal results are displayed) Labs Reviewed  COMPREHENSIVE METABOLIC PANEL - Abnormal; Notable for the following components:      Result Value   Potassium 2.4 (*)    Chloride 95 (*)    CO2 37 (*)    BUN 30 (*)    Calcium 8.4 (*)    Total Protein 5.6 (*)    Albumin 2.9 (*)    Total Bilirubin 2.0 (*)    All other components within normal limits  CBC WITH DIFFERENTIAL/PLATELET - Abnormal; Notable for the following components:   WBC 16.1 (*)    RBC 3.79 (*)    Hemoglobin 11.4 (*)    HCT 37.0 (*)    Platelets 131 (*)    Neutro Abs 14.3 (*)    Lymphs Abs 0.6 (*)    Monocytes Absolute 1.2 (*)    Abs Immature Granulocytes 0.11 (*)    All other components within normal limits  URINALYSIS, ROUTINE W REFLEX MICROSCOPIC - Abnormal; Notable for the following components:   Hgb urine dipstick MODERATE (*)    Ketones, ur 5 (*)    All other components within normal limits  MAGNESIUM - Abnormal; Notable for the following components:   Magnesium 2.7 (*)     All other components within normal limits  URINALYSIS, MICROSCOPIC (REFLEX) - Abnormal; Notable for the following components:   Bacteria, UA RARE (*)    All other components within normal limits  RESP PANEL BY RT-PCR (FLU A&B, COVID) ARPGX2  CULTURE, BLOOD (ROUTINE X 2)  CULTURE, BLOOD (ROUTINE X 2)  LACTIC ACID, PLASMA  LACTIC ACID, PLASMA    EKG EKG Interpretation  Date/Time:  Wednesday December 20 2020 09:37:08 EDT Ventricular Rate:  92 PR Interval:  202 QRS Duration: 102 QT Interval:  457 QTC Calculation: 566 R Axis:   91 Text Interpretation: Sinus rhythm Multiple ventricular premature complexes Right axis deviation Borderline repolarization abnormality Prolonged QT interval Confirmed by Dene Gentry [phone]) on 12/20/2020 9:50:10 AM  Radiology DG Chest 1 View  Result Date: 12/20/2020 CLINICAL DATA:  Found on floor. EXAM: CHEST  1 VIEW COMPARISON:  05/26/2020 FINDINGS: Chronic elevation of the right hemidiaphragm. Left Port-A-Cath remains in place with the tip in the SVC. Mild cardiomegaly. No confluent airspace opacities or effusions. Bilateral shoulder replacements. No acute bony abnormality. IMPRESSION: Chronic elevation of the right hemidiaphragm. No acute cardiopulmonary disease. Electronically Signed   By: Rolm Baptise M.D.   On: 12/20/2020 10:15   DG Pelvis 1-2 Views  Result Date: 12/20/2020 CLINICAL DATA:  Fall EXAM: PELVIS - 1-2 VIEW COMPARISON:  CT pelvis 07/20/2020 FINDINGS: There is no acute fracture or dislocation. Femoroacetabular alignment is normal. The SI joints and symphysis pubis are intact. There is mild degenerative change of both hips. Lower lumbar spinal fusion hardware is partially imaged. IMPRESSION: No acute fracture or dislocation. Electronically Signed   By: Valetta Mole M.D.   On: 12/20/2020 10:17   DG Elbow Complete Left  Result Date: 12/20/2020 CLINICAL DATA:  Fall with left elbow pain EXAM: LEFT ELBOW - COMPLETE 3+ VIEW COMPARISON:  None.  FINDINGS: There is no evidence of fracture, dislocation, or joint effusion. Generalized osteopenia. IMPRESSION: No acute finding. Electronically Signed   By: Gilford Silvius.D.  On: 12/20/2020 10:15   DG Tibia/Fibula Right  Result Date: 12/20/2020 CLINICAL DATA:  Right leg pain after fall. EXAM: RIGHT TIBIA AND FIBULA - 2 VIEW COMPARISON:  None. FINDINGS: Status post right knee and ankle arthroplasty. No fracture or dislocation is noted. IMPRESSION: No acute abnormality seen. Electronically Signed   By: Marijo Conception M.D.   On: 12/20/2020 10:19   CT Head Wo Contrast  Result Date: 12/20/2020 CLINICAL DATA:  Head trauma, minor (Age >= 65y) Fall. EXAM: CT HEAD WITHOUT CONTRAST TECHNIQUE: Contiguous axial images were obtained from the base of the skull through the vertex without intravenous contrast. COMPARISON:  05/25/2020 FINDINGS: Brain: Chronic microvascular disease throughout the deep white matter, stable since prior study. No acute intracranial abnormality. Specifically, no hemorrhage, hydrocephalus, mass lesion, acute infarction, or significant intracranial injury. Vascular: No hyperdense vessel or unexpected calcification. Skull: No acute calvarial abnormality. Sinuses/Orbits: No acute findings Other: None IMPRESSION: Similar chronic small vessel disease. No acute intracranial abnormality. Electronically Signed   By: Rolm Baptise M.D.   On: 12/20/2020 10:29   CT Cervical Spine Wo Contrast  Result Date: 12/20/2020 CLINICAL DATA:  Neck trauma (Age >= 65y).  Fall. EXAM: CT CERVICAL SPINE WITHOUT CONTRAST TECHNIQUE: Multidetector CT imaging of the cervical spine was performed without intravenous contrast. Multiplanar CT image reconstructions were also generated. COMPARISON:  06/04/2016 FINDINGS: Alignment: Degenerative anterolisthesis of C3 on C4, C4 on C5 and C7 on T1, stable since prior study. Skull base and vertebrae: No acute fracture. No primary bone lesion or focal pathologic process. Soft  tissues and spinal canal: No prevertebral fluid or swelling. No visible canal hematoma. Disc levels: Diffuse advanced degenerative disc disease and facet disease. Upper chest: No acute findings Other: None IMPRESSION: Diffuse advanced degenerative disc disease and facet disease. Multilevel degenerative anterolisthesis, stable since prior study. No acute bony abnormality. Electronically Signed   By: Rolm Baptise M.D.   On: 12/20/2020 10:32   DG Foot 2 Views Right  Result Date: 12/20/2020 CLINICAL DATA:  Right foot pain after fall. EXAM: RIGHT FOOT - 2 VIEW COMPARISON:  January 29, 2013. FINDINGS: Status post arthroplasty of talotibial joint. Status post surgical fusion of the first and second tarsometatarsal joints. Probable arthroplasty involving the third tarsometatarsal joint. Stable chronic resorption is seen involving the distal portions of the third, fourth and fifth metatarsals. No acute fracture or dislocation is noted. No soft tissue abnormality is noted. IMPRESSION: Chronic postsurgical and degenerative changes are noted as described above. No definite acute abnormality is seen. Electronically Signed   By: Marijo Conception M.D.   On: 12/20/2020 10:18    Procedures Procedures   Medications Ordered in ED Medications  vancomycin (VANCOREADY) IVPB 1250 mg/250 mL (has no administration in time range)  ondansetron (ZOFRAN) injection 4 mg (4 mg Intravenous Given 12/20/20 1045)  HYDROmorphone (DILAUDID) injection 0.5 mg (0.5 mg Intravenous Given 12/20/20 1046)  acetaminophen (TYLENOL) suppository 650 mg (650 mg Rectal Given 12/20/20 1052)  potassium chloride 10 mEq in 100 mL IVPB (0 mEq Intravenous Stopped 12/20/20 1306)  vancomycin (VANCOREADY) IVPB 1500 mg/300 mL (1,500 mg Intravenous New Bag/Given 12/20/20 1257)    ED Course  I have reviewed the triage vital signs and the nursing notes.  Pertinent labs & imaging results that were available during my care of the patient were reviewed by me and  considered in my medical decision making (see chart for details).  I discussed this case with my attending physician who cosigned this note  including patient's presenting symptoms, physical exam, and planned diagnostics and interventions. Attending physician stated agreement with plan or made changes to plan which were implemented.   [name] is a 75 y.o. male on Elqiuis with history of malignant melanoma receiving radiation presents the ED via EMS from [place] for unwitnessed fall between 2100 yesterday and 0800 today. Differential diagnosis includes brain bleed, cervical fracture, left elbow fracture, right tib/fib fracture, cellulitis, sepsis.   Patient is mildly hypotensive, and febrile at 100.8 F PR.  Mild tachypnea. On exam patient's O2 sat was 70% on room air.  The patient said he felt mildly short of breath, and he denied any at home oxygen use or oxygen requirement.  6 L nasal cannula administered the patient went up to 98%.  Wean down to 2 L with a saturation of 94%.  Failed trial weans by desatting to 82%. He is satting 94% in the room on 2L [place].  Patient was still speaking in full sentences.  PR Tylenol ordered for fever control.   I personally reviewed the patient's labs and imaging.  Infectious work-up was ordered based on patient's physical exam and presentation for possible cellulitis of right lower extremity. CMP showed hypokalemia at 2.4.  Normal creatinine of 0.98.  CBC shows mild anemia of 11.4/37.0 is mildly lower than his previous CBC a month ago.  Leukocytosis of 16.1 supporting the likelihood of infection .  Lactic acid is 1. XR of left elbow, chest, pelvis, right foot, right tib-fib showed no acute changes. CT of the head and C-spine showed no acute intracranial or bony abnormalities, just degenerative changes.  Dilaudid and zofran ordered for patient's pain and nausea. 10 mEq of potassium chloride ordered for hypokalemia.  After consulting with pharmacy due to  patient's allergies, immunocompromise state, and source of infection, it was recommended to give the patient a dose of vancomycin.  On reevaluation, the patient is lying comfortably in bed and reports his pain is "almost gone".  I discussed the admission plan with the patient and the need to be admitted to treat cellulitis of his right lower extremity.  He is agreeable to plan and has no further questions.  Will admit to Dr. Marylyn Ishihara for continuation of care.     MDM Rules/Calculators/A&P   Final Clinical Impression(s) / ED Diagnoses Final diagnoses:  Cellulitis of left lower extremity  Fall, initial encounter  Fever, unspecified fever cause  Hypokalemia    Rx / DC Orders ED Discharge Orders     None        Sherrell Puller, PA-C 12/20/20 1459    Valarie Merino, MD 12/22/20 [phone]

## 2020-12-20 NOTE — ED Notes (Signed)
Inpatient nurse message for a 2nd time to verify there were no questions prior to brining up the patient.

## 2020-12-20 NOTE — ED Notes (Signed)
Patient O2 levles noted to be 74% on room air; good waveform noted. [name], [name] in room when O2 levels were noted. Pt placed on 6L O2 by Aneta. O2 levels came up to 99%. O2 weaned down to 2L with levels at 94%.

## 2020-12-20 NOTE — ED Notes (Signed)
Patient transported to X-ray 

## 2020-12-20 NOTE — Progress Notes (Signed)
A consult was received from an ED physician for vancomycin per pharmacy dosing.  The patient's profile has been reviewed for ht/wt/allergies/indication/available labs.   A one time order has been placed for vancomycin 1500 mg x 1.  Further antibiotics/pharmacy consults should be ordered by admitting physician if indicated.                       Thank you, Napoleon Form 12/20/2020  11:46 AM

## 2020-12-20 NOTE — Progress Notes (Signed)
Pharmacy Antibiotic Note  [name] is a 75 y.o. male with LLE malignant melanoma and left leg lymphedema who presented to the ED on 12/20/2020 s/p fall at [facility].  Pharmacy has been consulted to dose vancomycin for RLE cellulitis.   Plan: - vancomycin 1500 mg IV x1 given in the ED at 1257, then 1242m IV q24h for est AUC 496    ____________________________________ Temp (24hrs), Avg:100.8 F (38.2 C), Min:100.8 F (38.2 C), Max:100.8 F (38.2 C)  Recent Labs  Lab 12/20/20 0911 12/20/20 0912 12/20/20 1112  WBC 16.1*  --   --   CREATININE 0.98  --   --   LATICACIDVEN  --  1.0 0.8    Estimated Creatinine Clearance: 60.6 mL/min (by C-G formula based on SCr of 0.98 mg/dL).    Allergies  Allergen Reactions   Cefepime Hives and Shortness Of Breath   Cephaeline Hives   Morphine Swelling    A swollen stomach.   Morphine And Related Shortness Of Breath, Nausea And Vomiting, Swelling and Other (See Comments)    Agitation, tolerates dilaudid Other reaction(s): Other (See Comments) Agitation, tolerates dilaudid   Penicillins Hives, Shortness Of Breath and Rash    Has patient had a PCN reaction causing immediate rash, facial/tongue/throat swelling, SOB or lightheadedness with hypotension: Yes Has patient had a PCN reaction causing severe rash involving mucus membranes or skin necrosis: Yes Has patient had a PCN reaction that required hospitalization No Has patient had a PCN reaction occurring within the last 10 years: No If all of the above answers are "NO", then may proceed with Cephalosporin use.    Doxycycline Rash   Oxycodone-Acetaminophen Other (See Comments)    Patient doesn't remember what type of reaction.     Thank you for allowing pharmacy to be a part of this patient's care.  PLynelle Doctor9/14/2022 2:35 PM

## 2020-12-20 NOTE — ED Notes (Signed)
Pt currently has no output via condom catheter.

## 2020-12-20 NOTE — ED Notes (Signed)
3rd attempt made to reach out to the [place], [place], the inpatient nurse who will be receiving the patient.

## 2020-12-20 NOTE — ED Notes (Signed)
Pt was provided a lower body bath, pt required extensive bath due to large amount of dry stool. Pt was cleaned, repositioned, condom catheter applied, brief applied and was provided oral scrubbing via damp swabs by [employer] RN.

## 2020-12-20 NOTE — ED Triage Notes (Signed)
BIBA Per EMS: Pt coming from [facility]. Pt has a fall at 9PM last night and was not found until this morning. Pt covered in feces and urine. Pt c/o neck, back, leg and left elbow pain. Pt takes eliquis. Denies hitting head, denies LOC.

## 2020-12-20 NOTE — H&P (Signed)
History and Physical    [name] BJY:[id number] DOB: [birth year] DOA: 12/20/2020  PCP: Cassandria Anger, MD  Patient coming from: [facility]  Chief Complaint: fall  HPI: [name] is a 75 y.o. male with medical history significant of malignant melanoma, pAfib on eliquis, anxiety. Presenting after fall. He reports that he's felt week over the last couple of days. He had a near fall two nights ago, but was able to catch himself. Unfortunately, last night he was unable to catch himself. He states last night he was returning to his bed, when he tripped over a rug. He fell "straight down" on his butt and rolled to his back. He was unable to get up. He remembers the entire fall. There was no LOC. He did hit the back of his head. He was unable to get help for a few hours per his report. When he was found this morning, EMS was called for help. He denies any other aggravating or alleviating factors.   ED Course: CTH, c-spine negative for acute changes. K+ was 2.4. He was given 10 mEq of K+ IV. It was noted that he had left groin cellulitis, an elevated WBC and an elevated temp. He was started on vanc. TR[facility] was called for admission.   Review of Systems:  Review of systems is otherwise negative for all not mentioned in HPI.   PMHx Past Medical History:  Diagnosis Date   Alcoholism /alcohol abuse    per family   Allergic rhinitis    Allergy    Anxiety    Bacterial infection    CAD (coronary artery disease)    minimal coronary plaque in the LAD and right coronary system. 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 7-06. Last nuclear stress 11-17-06 fixed anterior/ inferior defect, no inducible ischemia, EF 81%   Cataract    CHF (congestive heart failure) (HCC)    Chronic back pain    "all over back"   Chronic neck pain    COPD (chronic obstructive pulmonary disease) ([place])    Depression    Diverticulosis     DVT (deep venous thrombosis) ([place]) 06/2020   Dyspnea    with exertion   Dysrhythmia 01/24/2012   past hx. A.Fib x1 episode-responded to med.   Falls frequently    "since 02/2013" (06/16/2013)   Family history of cancer    Genetic testing 03/06/2017   Multi-Cancer panel (83 genes) @ [facility] - No pathogenic mutations detected   GERD (gastroesophageal reflux disease)    Goals of care, counseling/discussion 10/12/2020   Hemochromatosis    dx'd 14 yrs ago last ferritin Aug 11, 08 52 (22-322), Fe 136 ("I had 250 phlebotomies for that")   High cholesterol    hx   History of radiation therapy 11/06/2020   left inguinal thigh 10/10/2020-11/06/2020  Dr Gery Pray   Hx of colonic polyps    Hx of colonoscopy    Hypertension    Iron deficiency anemia due to chronic blood loss 12/27/2016   Malignant melanoma of knee, left (HCC)    Myocardial infarction [facility]) 2006   "related to catheterization"   Narcotic abuse ([place])    per family   Neuropathy    fingers and toes   Osteoarthritis    Peripheral edema    Bilateral ankles   PONV (postoperative nausea and vomiting)    Pulmonary embolism ([place]) 06/2020   RA (rheumatoid arthritis) ([place])  PSHx Past Surgical History:  Procedure Laterality Date   ABDOMINAL ADHESION SURGERY  ~ 1968   ANKLE RECONSTRUCTION Right 6-09   [facility]   APPENDECTOMY  ~ [place] Left 03/27/2017   Procedure: ASPIRATION OF SEROMA;  Surgeon: Stark Klein, MD;  Location: [place];  Service: General;  Laterality: Left;   BONE TUMOR RESECTION  ~ 1954   "taken off my mastoid"   [place] Right 1990's   CATARACT EXTRACTION, BILATERAL Bilateral 01-24-12   CORONARY ANGIOPLASTY WITH STENT PLACEMENT  2006   "while repairing 1st stent, a second area tore and they had to place 2nd stent " ?LAD & CX   CORONARY ANGIOPLASTY WITH STENT PLACEMENT  06/17/2014   CYST EXCISION  "several OR's"   "backX 2, back of my neck, face, inside right  bicept, chest, wrist"   FOOT SURGERY Right 11-08   for removal of bone spurs-   HAMMER TOE SURGERY Right 07/2012   "broke 4 hammertoes"    HARDWARE REMOVAL  03/09/2012   Procedure: HARDWARE REMOVAL;  Surgeon: Nita Sells, MD;  Location: [place];  Service: Orthopedics;  Laterality: Left;  Hardware Removal from Left Shoulder   HARVEST BONE GRAFT  02/06/2012   Procedure: HARVEST ILIAC BONE GRAFT;  Surgeon: Nita Sells, MD;  Location: [facility];  Service: Orthopedics;;  bone marrow aspirqation    INGUINAL HERNIA REPAIR Bilateral    JOINT REPLACEMENT     KNEE SURGERY Left ~ 2003   "6-12 months after uni knee removed synovial sack"   LEFT HEART CATHETERIZATION WITH CORONARY ANGIOGRAM N/A 06/17/2014   PCI of diffuse severe stenosis in the proximal to mid LAD using overlapping drug-eluting stents.   LUMBAR WOUND DEBRIDEMENT N/A 03/17/2013   Procedure: Incision and drainage of superficial lumbar wound;  Surgeon: Floyce Stakes, MD;  Location: [facility];  Service: Neurosurgery;  Laterality: N/A;  Incision and drainage of superficial lumbar wound   LYMPH NODE BIOPSY N/A 08/15/2020   Procedure: EXCISIONAL LYMPH NODE BIOPSY LEFT INGUINAL REGION, EXCISION OF LEFT THIGH NODULE, EXCISION OF LEFT ABDOMINAL WALL LESION;  Surgeon: Stark Klein, MD;  Location: [facility];  Service: General;  Laterality: N/A;   MECKEL DIVERTICULUM EXCISION  ~ [place] LYMPH NODE BIOPSY Left 02/12/2017   WIDE LOCAL EXCISION LEFT KNEE MELANOMA, ADVANCEMENT FLAP CLOSURE, AND SENTINEL LYMPH NODE MAPPING AND BIOPSY.   MELANOMA EXCISION WITH SENTINEL LYMPH NODE BIOPSY Left 02/12/2017   Procedure: WIDE LOCAL EXCISION LEFT KNEE MELANOMA, ADVANCEMENT FLAP CLOSURE, AND SENTINEL LYMPH NODE MAPPING AND BIOPSY.;  Surgeon: Stark Klein, MD;  Location: [facility];  Service: General;  Laterality: Left;  GENERAL AND LOCAL   ORIF SHOULDER FRACTURE  02/06/2012   Procedure: OPEN  REDUCTION INTERNAL FIXATION (ORIF) SHOULDER FRACTURE;  Surgeon: Nita Sells, MD;  Location: [facility];  Service: Orthopedics;  Laterality: Left;  ORIF of a Left Shoulder Fracture with  Iliac Crest Bone Graft aspiration    PORTACATH PLACEMENT Left 03/27/2017   Procedure: INSERTION PORT-A-CATH;  Surgeon: Stark Klein, MD;  Location: [place];  Service: General;  Laterality: Left;   POSTERIOR LUMBAR FUSION  12-10   L4-5 diskectomy w/ fusion, cage placement and rods; [place]   POSTERIOR LUMBAR FUSION 4 LEVEL N/A 03/02/2013   Procedure: Lumbar One to Sacral One Posterior lumbar interbody fusion;  Surgeon: Floyce Stakes, MD;  Location: [facility];  Service:  Neurosurgery;  Laterality: N/A;  L1 to S1 Posterior lumbar interbody fusion   REPLACEMENT UNICONDYLAR JOINT KNEE Left ~ 2003   "~ 6 months after total knee replaced"   SHOULDER ARTHROSCOPY Left ~ 2004 X 2   "@ [facility]; left bone splinter in & had to clean it out"   TOTAL ANKLE REPLACEMENT Right 2008   at [facility] Bilateral 2002   TOTAL SHOULDER REPLACEMENT Left 2006   TOTAL SHOULDER REPLACEMENT Right ~ 2007   Dr. Marlou Sa    SocHx  reports that he has never smoked. He has never used smokeless tobacco. He reports that he does not drink alcohol and does not use drugs.  Allergies  Allergen Reactions   Cefepime Hives and Shortness Of Breath   Cephaeline Hives   Morphine Swelling    A swollen stomach.   Morphine And Related Shortness Of Breath, Nausea And Vomiting, Swelling and Other (See Comments)    Agitation, tolerates dilaudid Other reaction(s): Other (See Comments) Agitation, tolerates dilaudid   Penicillins Hives, Shortness Of Breath and Rash    Has patient had a PCN reaction causing immediate rash, facial/tongue/throat swelling, SOB or lightheadedness with hypotension: Yes Has patient had a PCN reaction causing severe rash involving mucus membranes or skin necrosis: Yes Has patient had  a PCN reaction that required hospitalization No Has patient had a PCN reaction occurring within the last 10 years: No If all of the above answers are "NO", then may proceed with Cephalosporin use.    Doxycycline Rash   Oxycodone-Acetaminophen Other (See Comments)    Patient doesn't remember what type of reaction.    FamHx Family History  Problem Relation Age of Onset   Uterine cancer Mother        uterine vs. cervix? dx 66s; deceased 105   Macular degeneration Mother    Other Mother        ankle edema   Lung cancer Mother    Melanoma Mother        multiple spots on legs   Cancer - Lung Mother        smoker   Coronary artery disease Father    Hypertension Father    Prostate cancer Father    Colon polyps Father    Cancer - Prostate Father 104       deceased 49   Heart attack Brother    Hyperlipidemia Brother    Other Brother        Schizophrenic   Thyroid disease Sister    Hemochromatosis Sister    Rheum arthritis Sister    Coronary artery disease Maternal Aunt    Heart attack Maternal Aunt    Cancer Maternal Grandmother        unk. type; deceased 38s   Diabetes Neg Hx    Colon cancer Neg Hx    Esophageal cancer Neg Hx    Rectal cancer Neg Hx    Stomach cancer Neg Hx     Prior to Admission medications   Medication Sig Start Date End Date Taking? Authorizing Provider  albuterol (PROVENTIL HFA;VENTOLIN HFA) 108 (90 Base) MCG/ACT inhaler Inhale 1-2 puffs into the lungs every 4 (four) hours as needed for wheezing or shortness of breath. 09/11/16  Yes Plotnikov, Evie Lacks, MD  ALPRAZolam Duanne Moron) 0.5 MG tablet Take 1 tablet (0.5 mg total) by mouth 3 (three) times daily as needed for anxiety. for anxiety 10/10/20  Yes Plotnikov, Evie Lacks, MD  apixaban (ELIQUIS) 5 MG TABS  tablet Take 1 tablet (5 mg total) by mouth 2 (two) times daily. 06/06/20 12/20/20 Yes Plotnikov, Evie Lacks, MD  Cholecalciferol (VITAMIN D3) 2000 UNITS capsule Take 1 capsule (2,000 Units total) by mouth daily.  12/05/14  Yes Plotnikov, Evie Lacks, MD  FLUoxetine (PROZAC) 40 MG capsule Take 40 mg by mouth daily. 11/07/20  Yes [provider]  fluticasone (FLONASE) 50 MCG/ACT nasal spray INSTILL 2 SPRAYS IN EACH NOSTRIL EVERY DAY Patient taking differently: Place 2 sprays into both nostrils daily as needed for allergies. 11/19/17  Yes Plotnikov, Evie Lacks, MD  gabapentin (NEURONTIN) 300 MG capsule Take 2 capsules (600 mg total) by mouth at bedtime. 11/02/20  Yes Plotnikov, Evie Lacks, MD  HYDROmorphone (DILAUDID) 2 MG tablet Take 1-1.5 tablets (2-3 mg total) by mouth every 8 (eight) hours as needed for severe pain. 10/10/20  Yes Plotnikov, Evie Lacks, MD  lidocaine-prilocaine (EMLA) cream Apply 1 application topically as needed. 05/25/20  Yes Celso Amy, NP  Melatonin 10 MG TABS Take 10 mg by mouth at bedtime.   Yes [provider]  metoprolol tartrate (LOPRESSOR) 25 MG tablet TAKE 1 TABLET TWO TIMES DAILY. Patient taking differently: Take 25 mg by mouth 2 (two) times daily. 11/06/20  Yes Plotnikov, Evie Lacks, MD  mupirocin ointment (BACTROBAN) 2 % On leg wound w/dressing change qd or bid 11/02/20  Yes Plotnikov, Evie Lacks, MD  pantoprazole (PROTONIX) 40 MG tablet Take 1 tablet (40 mg total) by mouth every evening. Patient taking differently: Take 40 mg by mouth daily as needed (indigestion). 06/19/20  Yes Plotnikov, Evie Lacks, MD  polyvinyl alcohol (LIQUIFILM TEARS) 1.4 % ophthalmic solution Place 1 drop into both eyes as needed for dry eyes.   Yes [provider]  predniSONE (DELTASONE) 5 MG tablet Take 15 mg by mouth daily. 06/19/20  Yes [provider]  Pyridoxine HCl (B-6 PO) Take 1 tablet by mouth daily.   Yes [provider]  temazepam (RESTORIL) 15 MG capsule Take 1 capsule (15 mg total) by mouth at bedtime as needed for sleep. 10/12/20  Yes [name], MD  torsemide (DEMADEX) 100 MG tablet Take 0.5-1 tablets (50-100 mg total) by mouth daily as needed. Patient  taking differently: Take 50-100 mg by mouth daily as needed (fluid). 07/05/20  Yes Plotnikov, Evie Lacks, MD  vitamin B-12 (CYANOCOBALAMIN) 1000 MCG tablet Take 1,000 mcg by mouth daily.    Yes [provider]  sulfamethoxazole-trimethoprim (BACTRIM DS) 800-160 MG tablet Take 1 tablet by mouth 2 (two) times daily. Patient not taking: Reported on 12/20/2020 11/24/20   Gery Pray, MD    Physical Exam: Vitals:   12/20/20 1115 12/20/20 1130 12/20/20 1145 12/20/20 1230  BP:  (!) 102/55  96/60  Pulse: 92 91 87 84  Resp: (!) 24 (!) 24 (!) 23 12  Temp:      TempSrc:      SpO2: 92% 92% 92% 94%    General: 75 y.o. male resting in bed in NAD Eyes: PERRL, normal sclera ENMT: Nares patent w/o discharge, orophaynx clear, dentition normal, ears w/o discharge/lesions/ulcers Neck: Supple, trachea midline Cardiovascular: RRR, +S1, S2, no m/g/r, equal pulses throughout Respiratory: CTABL, no w/r/r, normal WOB GI: BS+, NDNT, no masses noted, no organomegaly noted MSK: No e/c/c Skin: No rashes, bruises, ulcerations noted Neuro: A&O x 3, no focal deficits Psyc: Appropriate interaction and affect, calm/cooperative  Labs on Admission: I have personally reviewed following labs and imaging studies  CBC: Recent Labs  Lab  12/20/20 0911  WBC 16.1*  NEUTROABS 14.3*  HGB 11.4*  HCT 37.0*  MCV 97.6  PLT 793*   Basic Metabolic Panel: Recent Labs  Lab 12/20/20 0911  NA 142  K 2.4*  CL 95*  CO2 37*  GLUCOSE 99  BUN 30*  CREATININE 0.98  CALCIUM 8.4*  MG 2.7*   GFR: Estimated Creatinine Clearance: 60.6 mL/min (by C-G formula based on SCr of 0.98 mg/dL). Liver Function Tests: Recent Labs  Lab 12/20/20 0911  AST 26  ALT 19  ALKPHOS 47  BILITOT 2.0*  PROT 5.6*  ALBUMIN 2.9*   No results for input(s): LIPASE, AMYLASE in the last 168 hours. No results for input(s): AMMONIA in the last 168 hours. Coagulation Profile: No results for input(s): INR, PROTIME in the last 168  hours. Cardiac Enzymes: No results for input(s): CKTOTAL, CKMB, CKMBINDEX, TROPONINI in the last 168 hours. BNP (last 3 results) No results for input(s): PROBNP in the last 8760 hours. HbA1C: No results for input(s): HGBA1C in the last 72 hours. CBG: No results for input(s): GLUCAP in the last 168 hours. Lipid Profile: No results for input(s): CHOL, HDL, LDLCALC, TRIG, CHOLHDL, LDLDIRECT in the last 72 hours. Thyroid Function Tests: No results for input(s): TSH, T4TOTAL, FREET4, T3FREE, THYROIDAB in the last 72 hours. Anemia Panel: No results for input(s): VITAMINB12, FOLATE, FERRITIN, TIBC, IRON, RETICCTPCT in the last 72 hours. Urine analysis:    Component Value Date/Time   COLORURINE YELLOW 12/20/2020 [place] 12/20/2020 1049   LABSPEC 1.020 12/20/2020 1049   PHURINE 6.0 12/20/2020 1049   GLUCOSEU NEGATIVE 12/20/2020 1049   GLUCOSEU NEGATIVE 03/10/2015 1008   HGBUR MODERATE (A) 12/20/2020 1049   BILIRUBINUR NEGATIVE 12/20/2020 1049   KETONESUR 5 (A) 12/20/2020 1049   PROTEINUR NEGATIVE 12/20/2020 1049   UROBILINOGEN 0.2 03/10/2015 1008   NITRITE NEGATIVE 12/20/2020 1049   LEUKOCYTESUR NEGATIVE 12/20/2020 1049    Radiological Exams on Admission: DG Chest 1 View  Result Date: 12/20/2020 CLINICAL DATA:  Found on floor. EXAM: CHEST  1 VIEW COMPARISON:  05/26/2020 FINDINGS: Chronic elevation of the right hemidiaphragm. Left Port-A-Cath remains in place with the tip in the SVC. Mild cardiomegaly. No confluent airspace opacities or effusions. Bilateral shoulder replacements. No acute bony abnormality. IMPRESSION: Chronic elevation of the right hemidiaphragm. No acute cardiopulmonary disease. Electronically Signed   By: Rolm Baptise M.D.   On: 12/20/2020 10:15   DG Pelvis 1-2 Views  Result Date: 12/20/2020 CLINICAL DATA:  Fall EXAM: PELVIS - 1-2 VIEW COMPARISON:  CT pelvis 07/20/2020 FINDINGS: There is no acute fracture or dislocation. Femoroacetabular alignment is  normal. The SI joints and symphysis pubis are intact. There is mild degenerative change of both hips. Lower lumbar spinal fusion hardware is partially imaged. IMPRESSION: No acute fracture or dislocation. Electronically Signed   By: Valetta Mole M.D.   On: 12/20/2020 10:17   DG Elbow Complete Left  Result Date: 12/20/2020 CLINICAL DATA:  Fall with left elbow pain EXAM: LEFT ELBOW - COMPLETE 3+ VIEW COMPARISON:  None. FINDINGS: There is no evidence of fracture, dislocation, or joint effusion. Generalized osteopenia. IMPRESSION: No acute finding. Electronically Signed   By: Jorje Guild M.D.   On: 12/20/2020 10:15   DG Tibia/Fibula Right  Result Date: 12/20/2020 CLINICAL DATA:  Right leg pain after fall. EXAM: RIGHT TIBIA AND FIBULA - 2 VIEW COMPARISON:  None. FINDINGS: Status post right knee and ankle arthroplasty. No fracture or dislocation is noted. IMPRESSION: No  acute abnormality seen. Electronically Signed   By: Marijo Conception M.D.   On: 12/20/2020 10:19   CT Head Wo Contrast  Result Date: 12/20/2020 CLINICAL DATA:  Head trauma, minor (Age >= 65y) Fall. EXAM: CT HEAD WITHOUT CONTRAST TECHNIQUE: Contiguous axial images were obtained from the base of the skull through the vertex without intravenous contrast. COMPARISON:  05/25/2020 FINDINGS: Brain: Chronic microvascular disease throughout the deep white matter, stable since prior study. No acute intracranial abnormality. Specifically, no hemorrhage, hydrocephalus, mass lesion, acute infarction, or significant intracranial injury. Vascular: No hyperdense vessel or unexpected calcification. Skull: No acute calvarial abnormality. Sinuses/Orbits: No acute findings Other: None IMPRESSION: Similar chronic small vessel disease. No acute intracranial abnormality. Electronically Signed   By: Rolm Baptise M.D.   On: 12/20/2020 10:29   CT Cervical Spine Wo Contrast  Result Date: 12/20/2020 CLINICAL DATA:  Neck trauma (Age >= 65y).  Fall. EXAM: CT CERVICAL  SPINE WITHOUT CONTRAST TECHNIQUE: Multidetector CT imaging of the cervical spine was performed without intravenous contrast. Multiplanar CT image reconstructions were also generated. COMPARISON:  06/04/2016 FINDINGS: Alignment: Degenerative anterolisthesis of C3 on C4, C4 on C5 and C7 on T1, stable since prior study. Skull base and vertebrae: No acute fracture. No primary bone lesion or focal pathologic process. Soft tissues and spinal canal: No prevertebral fluid or swelling. No visible canal hematoma. Disc levels: Diffuse advanced degenerative disc disease and facet disease. Upper chest: No acute findings Other: None IMPRESSION: Diffuse advanced degenerative disc disease and facet disease. Multilevel degenerative anterolisthesis, stable since prior study. No acute bony abnormality. Electronically Signed   By: Rolm Baptise M.D.   On: 12/20/2020 10:32   DG Foot 2 Views Right  Result Date: 12/20/2020 CLINICAL DATA:  Right foot pain after fall. EXAM: RIGHT FOOT - 2 VIEW COMPARISON:  January 29, 2013. FINDINGS: Status post arthroplasty of talotibial joint. Status post surgical fusion of the first and second tarsometatarsal joints. Probable arthroplasty involving the third tarsometatarsal joint. Stable chronic resorption is seen involving the distal portions of the third, fourth and fifth metatarsals. No acute fracture or dislocation is noted. No soft tissue abnormality is noted. IMPRESSION: Chronic postsurgical and degenerative changes are noted as described above. No definite acute abnormality is seen. Electronically Signed   By: Marijo Conception M.D.   On: 12/20/2020 10:18    EKG: Independently reviewed. Sinus, PVCs, prolonged Qt, no st elevations  Assessment/Plan Left groin cellulitis Right shin cellulitis SIRS     - admit to inpatient, tele     - continue vanc, fluids     - follow bld cx     - will have wound care look at the patient; cystic lesions; he apparently has had multiple aspiration of left  thigh seromas (w/ Dr Barry Dienes?); may need formal surgery consult  Fall Generalized weakness     - this is multifactorial     - will have PT/OT look at him  Hx of malignant melanoma     - currently on radiation/chemo w/ Dr. Marin Olp, Dr Sondra Come     - ANC is ok  Normocytic anemia Mild thrombocytopenia     - no evidence of bleed     - continue eliquis  Anxiety     - continue prozac  Hypokalemia     - K+ is 2.4. He was given 13mq in ED     - order additional 50 mEq IV; 40 mEQ PO BID x 2 doses. Rpt K+ in AM    -  Mg2+ is 2.7  Hyperbilirubinemia     - elevation since August     - other LFTs ok     - check RUQ [place]  Rheumatoid arthritis     - continue home regimen  Paroxysmal A fib     - continue eliquis     - holding metoprolol d/t hypotension for now; follow  DVT prophylaxis: eliquis Code Status: DNR; confirmed with patient  Family Communication: None at bedside  Consults called: None   Status is: Inpatient  Remains inpatient appropriate because:Inpatient level of care appropriate due to severity of illness  Dispo: The patient is from: ALF              Anticipated d/c is to: ALF              Patient currently is not medically stable to d/c.   Difficult to place patient No  Time spent coordinating admission: 70 minutes  [facility]  If 7PM-7AM, please contact night-coverage [email or web address]  12/20/2020, 2:13 PM

## 2020-12-21 DIAGNOSIS — E876 Hypokalemia: Secondary | ICD-10-CM | POA: Diagnosis not present

## 2020-12-21 DIAGNOSIS — W19XXXA Unspecified fall, initial encounter: Secondary | ICD-10-CM

## 2020-12-21 DIAGNOSIS — L03116 Cellulitis of left lower limb: Secondary | ICD-10-CM | POA: Diagnosis not present

## 2020-12-21 DIAGNOSIS — L8990 Pressure ulcer of unspecified site, unspecified stage: Secondary | ICD-10-CM | POA: Insufficient documentation

## 2020-12-21 DIAGNOSIS — C439 Malignant melanoma of skin, unspecified: Secondary | ICD-10-CM

## 2020-12-21 LAB — COMPREHENSIVE METABOLIC PANEL
ALT: 26 U/L (ref 0–44)
AST: 40 U/L (ref 15–41)
Albumin: 2.7 g/dL — ABNORMAL LOW (ref 3.5–5.0)
Alkaline Phosphatase: 44 U/L (ref 38–126)
Anion gap: 5 (ref 5–15)
BUN: 32 mg/dL — ABNORMAL HIGH (ref 8–23)
CO2: 32 mmol/L (ref 22–32)
Calcium: 7.9 mg/dL — ABNORMAL LOW (ref 8.9–10.3)
Chloride: 104 mmol/L (ref 98–111)
Creatinine, Ser: 0.96 mg/dL (ref 0.61–1.24)
GFR, Estimated: >60 mL/min (ref >60)
Glucose, Bld: 232 mg/dL — ABNORMAL HIGH (ref 70–99)
Potassium: 3.4 mmol/L — ABNORMAL LOW (ref 3.5–5.1)
Sodium: 141 mmol/L (ref 135–145)
Total Bilirubin: 0.9 mg/dL (ref 0.3–1.2)
Total Protein: 5.5 g/dL — ABNORMAL LOW (ref 6.5–8.1)

## 2020-12-21 LAB — CBC WITH DIFFERENTIAL/PLATELET
Abs Immature Granulocytes: 0.05 10*3/uL (ref 0.00–0.07)
Basophils Absolute: 0 10*3/uL (ref 0.0–0.1)
Basophils Relative: 0 %
Eosinophils Absolute: 0 10*3/uL (ref 0.0–0.5)
Eosinophils Relative: 0 %
HCT: 36.2 % — ABNORMAL LOW (ref 39.0–52.0)
Hemoglobin: 10.8 g/dL — ABNORMAL LOW (ref 13.0–17.0)
Immature Granulocytes: 0 %
Lymphocytes Relative: 8 %
Lymphs Abs: 0.9 10*3/uL (ref 0.7–4.0)
MCH: 30.4 pg (ref 26.0–34.0)
MCHC: 29.8 g/dL — ABNORMAL LOW (ref 30.0–36.0)
MCV: 102 fL — ABNORMAL HIGH (ref 80.0–100.0)
Monocytes Absolute: 0.5 10*3/uL (ref 0.1–1.0)
Monocytes Relative: 4 %
Neutro Abs: 10.1 10*3/uL — ABNORMAL HIGH (ref 1.7–7.7)
Neutrophils Relative %: 88 %
Platelets: 126 10*3/uL — ABNORMAL LOW (ref 150–400)
RBC: 3.55 MIL/uL — ABNORMAL LOW (ref 4.22–5.81)
RDW: 13.7 % (ref 11.5–15.5)
WBC: 11.6 10*3/uL — ABNORMAL HIGH (ref 4.0–10.5)
nRBC: 0 % (ref 0.0–0.2)

## 2020-12-21 LAB — MAGNESIUM: Magnesium: 2.6 mg/dL — ABNORMAL HIGH (ref 1.7–2.4)

## 2020-12-21 MED ORDER — ENSURE SURGERY PO LIQD
237.0000 mL | Freq: Two times a day (BID) | ORAL | Status: DC
  Administered 2020-12-21 – 2020-12-27 (×7): 237 mL via ORAL
  Filled 2020-12-21 (×13): qty 237

## 2020-12-21 MED ORDER — TEMAZEPAM 15 MG PO CAPS
15.0000 mg | ORAL_CAPSULE | Freq: Every evening | ORAL | Status: DC | PRN
  Administered 2020-12-24 – 2020-12-26 (×3): 15 mg via ORAL
  Filled 2020-12-21 (×3): qty 1

## 2020-12-21 MED ORDER — HYDROMORPHONE HCL 2 MG PO TABS
2.0000 mg | ORAL_TABLET | Freq: Three times a day (TID) | ORAL | Status: DC | PRN
  Administered 2020-12-21: 2 mg via ORAL
  Administered 2020-12-21 – 2020-12-22 (×2): 3 mg via ORAL
  Administered 2020-12-22 – 2020-12-27 (×8): 2 mg via ORAL
  Filled 2020-12-21: qty 2
  Filled 2020-12-21 (×4): qty 1
  Filled 2020-12-21: qty 2
  Filled 2020-12-21 (×6): qty 1

## 2020-12-21 MED ORDER — ADULT MULTIVITAMIN W/MINERALS CH
1.0000 | ORAL_TABLET | Freq: Every day | ORAL | Status: DC
  Administered 2020-12-21 – 2020-12-27 (×7): 1 via ORAL
  Filled 2020-12-21 (×6): qty 1

## 2020-12-21 MED ORDER — VITAMIN B-12 1000 MCG PO TABS
1000.0000 ug | ORAL_TABLET | Freq: Every day | ORAL | Status: DC
  Administered 2020-12-21 – 2020-12-27 (×7): 1000 ug via ORAL
  Filled 2020-12-21 (×7): qty 1

## 2020-12-21 MED ORDER — FLUTICASONE PROPIONATE 50 MCG/ACT NA SUSP
2.0000 | Freq: Every day | NASAL | Status: DC | PRN
  Filled 2020-12-21: qty 16

## 2020-12-21 MED ORDER — SODIUM CHLORIDE 0.9% FLUSH
10.0000 mL | Freq: Two times a day (BID) | INTRAVENOUS | Status: DC
Start: 2020-12-21 — End: 2020-12-27
  Administered 2020-12-21 – 2020-12-27 (×12): 10 mL

## 2020-12-21 MED ORDER — VITAMIN B-6 50 MG PO TABS
50.0000 mg | ORAL_TABLET | Freq: Every day | ORAL | Status: DC
  Administered 2020-12-21 – 2020-12-27 (×7): 50 mg via ORAL
  Filled 2020-12-21 (×7): qty 1

## 2020-12-21 MED ORDER — CHLORHEXIDINE GLUCONATE CLOTH 2 % EX PADS
6.0000 | MEDICATED_PAD | Freq: Every day | CUTANEOUS | Status: DC
  Administered 2020-12-21 – 2020-12-27 (×7): 6 via TOPICAL

## 2020-12-21 NOTE — Progress Notes (Signed)
Initial Nutrition Assessment  INTERVENTION:   -Needs weight for admission  -Ensure Surgery PO BID, each provides 330 kcals and 18g protein   NUTRITION DIAGNOSIS:   Increased nutrient needs related to wound healing as evidenced by estimated needs.  GOAL:   Patient will meet greater than or equal to 90% of their needs  MONITOR:   Supplement acceptance, Labs, PO intake, Weight trends, I & O's  REASON FOR ASSESSMENT:   Malnutrition Screening Tool    ASSESSMENT:   75 y.o. male with medical history significant of malignant melanoma, pAfib on eliquis, anxiety. Presenting after fall.  Patient in room, receiving patient care for extended time. Will attempt to speak with pt at later time.  Per chart review, pt has stage 3 pressure injury and cellulitis of left groin and shin. Pt with history of melanoma, s/p chemotherapy and radiation.  Pt consumed 100% of breakfast this morning. Will order Ensure supplements for additional kcals and protein.   Per weight records, last recorded weight is 142 lbs on 9/1. Needs updated weight for admission. Noted weight loss.  Medications: KLORCON, Vitamin B-6, Vitamin B-12, KCl  Labs reviewed: Low K Elevated Mg   NUTRITION - FOCUSED PHYSICAL EXAM:  Unable to complete  Diet Order:   Diet Order             Diet Heart Room service appropriate? Yes; Fluid consistency: Thin  Diet effective now                   EDUCATION NEEDS:   No education needs have been identified at this time  Skin:  Skin Assessment: Skin Integrity Issues: Skin Integrity Issues:: Stage III Stage III: left buttocks  Last BM:  9/14 -type 1  Height:   Ht Readings from Last 1 Encounters:  12/07/20 5' 6"  (1.676 m)    Weight:   Wt Readings from Last 1 Encounters:  12/07/20 64.5 kg    BMI:  There is no height or weight on file to calculate BMI.  Estimated Nutritional Needs:   Kcal:  1900-2100  Protein:  75-100g  Fluid:  2L/day   Clayton Bibles, MS, RD, LDN Inpatient Clinical Dietitian Contact information available via Amion

## 2020-12-21 NOTE — Consult Note (Addendum)
[facility] Nurse Consult Note: Reason for Consult: Consult requested for left groin and right shin.  Pt states he develops lesions related to chemotherapy; they blister and then rupture.  Wound type: Left inner groin with partial thickness wound; previous blister with loose peeling skin surrounding yellow moist partial thickness wound bed; 2X2X.1cm, small amt yellow drainage, no fluctuance.  Right skin with dark purple hematoma extends above the skin level, intact skin to the location, no open wound or drainage, 3X3cm. Topical treatment orders provided for bedside nurses to perform to protect from further injury: Foam dressing to left groin and right shin, change Q 3 days or PRN soiling. Please re-consult if further assistance is needed.  Thank-you,  Julien Girt MSN, [place], [place], [place], [place]

## 2020-12-21 NOTE — Progress Notes (Signed)
PROGRESS NOTE    [name]  MAU:[id number] DOB: [birth year] DOA: 12/20/2020 PCP: Cassandria Anger, MD   Chief Complaint  Patient presents with   Fall   Back Pain   Leg Pain    Brief Narrative:  Patient 75 year old gentleman history of malignant melanoma, paroxysmal A. fib on Eliquis, anxiety presented after a fall.  Patient denies any LOC and remembers tripping over a rug.  CT head CT C-spine done was negative.  Patient noted to be hypokalemic elevated also concern for left groin cellulitis with a leukocytosis and fever.  Patient placed on vancomycin.   Assessment & Plan:   Active Problems:   Dehydration   Cellulitis   Pressure injury of skin   Fall   Hyperbilirubinemia   Malignant melanoma ([place])  #1 left upper thigh/groin cellulitis/right shin hematoma/SIRS -Patient admitted with concerns for SIRS with a fever, leukocytosis, concern for left groin/left upper thigh cellulitis. -Leukocytosis trending down. -Blood cultures ordered and pending. -Continue empiric IV vancomycin. -Resume home regimen oral Dilaudid for pain control. -W OC RN assessed patient and recommendations made.  2.  Fall/generalized weakness -PT/OT.  3.  History of malignant melanoma -Currently on radiation/chemo with Dr. Marin Olp, Dr. Dineen Kid. -Dr. Marin Olp with oncology notified of admission via epic.  4.  Normocytic anemia/mild thrombocytopenia -No evidence of bleeding noted. -Monitor on Eliquis.  5.  Anxiety -Continue Prozac.  6.  Hypokalemia Magnesium at 2.7. -Potassium on admission was 2.4 currently at 3.4 after repletion. -K-Dur 40 mEq p.o. x1.  7.  Hyperbilirubinemia -Noted to be elevated since August. -Bilirubin trended down and hyperbilirubinemia resolved -Right upper quadrant ultrasound somewhat limited due to body habitus and lack of acoustic window otherwise unremarkable.  No biliary ductal dilatation identified.  8.  Dehydration IV fluids.   DVT prophylaxis:  Eliquis Code Status: DNR Family Communication: Updated patient.  No family at bedside. Disposition:   Status is: Inpatient  Remains inpatient appropriate because:Inpatient level of care appropriate due to severity of illness  Dispo: The patient is from: Home              Anticipated d/c is to: Home              Patient currently is not medically stable to d/c.   Difficult to place patient No       Consultants:  Oncology notified of admission.  Procedures:  CT head CT C-spine 12/20/2020 Chest x-ray 12/20/2020 Plain films of the right tib-fib 12/20/2020 Plain films of the pelvis 12/20/2020 Pain.  The right foot 12/20/2020 Abdominal ultrasound 12/20/2020  Antimicrobials:  IV vancomycin 12/21/2020>>>>   Subjective: Laying in bed.  Overall feeling better.  No chest pain.  No shortness of breath.  No abdominal pain.  Objective: Vitals:   12/20/20 2149 12/21/20 0210 12/21/20 0452 12/21/20 1329  BP: 113/70 (!) 104/59 114/65 (!) 104/59  Pulse: 77 69 68 83  Resp: 19 18 16    Temp: 98.5 F (36.9 C) 97.6 F (36.4 C) 97.8 F (36.6 C) 98.6 F (37 C)  TempSrc:    Oral  SpO2: 96% 97% 98% 97%    Intake/Output Summary (Last 24 hours) at 12/21/2020 1438 Last data filed at 12/21/2020 1300 Gross per 24 hour  Intake 1807.14 ml  Output 700 ml  Net 1107.14 ml   There were no vitals filed for this visit.  Examination:  General exam: Appears calm and comfortable  Respiratory system: Clear to auscultation. Respiratory effort normal. Cardiovascular system: S1 & S2 heard,  RRR. No JVD, murmurs, rubs, gallops or clicks. No pedal edema. Gastrointestinal system: Abdomen is nondistended, soft and nontender. No organomegaly or masses felt. Normal bowel sounds heard. Central nervous system: Alert and oriented. No focal neurological deficits. Extremities: Symmetric 5 x 5 power. Skin: Left upper thigh with prior blister with some loose peeling skin with some surrounding erythema.  Right shin with  dark purple hematoma with intact skin no drainage noted, no surrounding erythema.  Psychiatry: Judgement and insight appear normal. Mood & affect appropriate.     Data Reviewed: I have personally reviewed following labs and imaging studies  CBC: Recent Labs  Lab 12/20/20 0911 12/20/20 1939 12/21/20 0928  WBC 16.1* 12.7* 11.6*  NEUTROABS 14.3*  --  10.1*  HGB 11.4* 10.7* 10.8*  HCT 37.0* 35.5* 36.2*  MCV 97.6 100.9* 102.0*  PLT 131* 114* 126*    Basic Metabolic Panel: Recent Labs  Lab 12/20/20 0911 12/20/20 1939 12/21/20 0928  NA 142 144 141  K 2.4* 2.6* 3.4*  CL 95* 99 104  CO2 37* 32 32  GLUCOSE 99 86 232*  BUN 30* 38* 32*  CREATININE 0.98 1.06 0.96  CALCIUM 8.4* 7.7* 7.9*  MG 2.7*  --  2.6*    GFR: Estimated Creatinine Clearance: 61.8 mL/min (by C-G formula based on SCr of 0.96 mg/dL).  Liver Function Tests: Recent Labs  Lab 12/20/20 0911 12/20/20 1939 12/21/20 0928  AST 26 26 40  ALT 19 19 26   ALKPHOS 47 45 44  BILITOT 2.0* 1.5* 0.9  PROT 5.6* 5.3* 5.5*  ALBUMIN 2.9* 2.9* 2.7*    CBG: No results for input(s): GLUCAP in the last 168 hours.   Recent Results (from the past 240 hour(s))  Culture, blood (routine x 2)     Status: None (Preliminary result)   Collection Time: 12/20/20  9:21 AM   Specimen: BLOOD  Result Value Ref Range Status   Specimen Description   Final    BLOOD PORTA CATH Performed at [facility] [address] [place], [place] [address]    Special Requests   Final    BOTTLES DRAWN AEROBIC AND ANAEROBIC Blood Culture adequate volume Performed at [facility] [address], [place], [place] [address]    Culture   Final    NO GROWTH < 24 HOURS Performed at [facility] [address], [place], [place] [address]    Report Status PENDING  Incomplete  Resp Panel by RT-PCR (Flu A&B, Covid) Nasopharyngeal Swab     Status: None   Collection Time: 12/20/20  9:22 AM   Specimen:  Nasopharyngeal Swab; Nasopharyngeal(NP) swabs in vial transport medium  Result Value Ref Range Status   SARS Coronavirus 2 by RT PCR NEGATIVE NEGATIVE Final    Comment: (NOTE) SARS-CoV-2 target nucleic acids are NOT DETECTED.  The SARS-CoV-2 RNA is generally detectable in upper respiratory specimens during the acute phase of infection. The lowest concentration of SARS-CoV-2 viral copies this assay can detect is 138 copies/mL. A negative result does not preclude SARS-Cov-2 infection and should not be used as the sole basis for treatment or other patient management decisions. A negative result may occur with  improper specimen collection/handling, submission of specimen other than nasopharyngeal swab, presence of viral mutation(s) within the areas targeted by this assay, and inadequate number of viral copies(<138 copies/mL). A negative result must be combined with clinical observations, patient history, and epidemiological information. The expected result is Negative.  Fact Sheet for  Patients:  EntrepreneurPulse.com.au  Fact Sheet for Healthcare Providers:  IncredibleEmployment.be  This test is no t yet approved or cleared by the [place] FDA and  has been authorized for detection and/or diagnosis of SARS-CoV-2 by FDA under an Emergency Use Authorization (EUA). This EUA will remain  in effect (meaning this test can be used) for the duration of the COVID-19 declaration under Section 564(b)(1) of the Act, 21 U.S.C.section 360bbb-3(b)(1), unless the authorization is terminated  or revoked sooner.       Influenza A by PCR NEGATIVE NEGATIVE Final   Influenza B by PCR NEGATIVE NEGATIVE Final    Comment: (NOTE) The Xpert Xpress SARS-CoV-2/FLU/RSV plus assay is intended as an aid in the diagnosis of influenza from Nasopharyngeal swab specimens and should not be used as a sole basis for treatment. Nasal washings and aspirates are unacceptable for  Xpert Xpress SARS-CoV-2/FLU/RSV testing.  Fact Sheet for Patients: EntrepreneurPulse.com.au  Fact Sheet for Healthcare Providers: IncredibleEmployment.be  This test is not yet approved or cleared by the [place] FDA and has been authorized for detection and/or diagnosis of SARS-CoV-2 by FDA under an Emergency Use Authorization (EUA). This EUA will remain in effect (meaning this test can be used) for the duration of the COVID-19 declaration under Section 564(b)(1) of the Act, 21 U.S.C. section 360bbb-3(b)(1), unless the authorization is terminated or revoked.  Performed at [facility], [place] [address] [place], [place] [address]   Culture, blood (routine x 2)     Status: None (Preliminary result)   Collection Time: 12/20/20  9:26 AM   Specimen: BLOOD  Result Value Ref Range Status   Specimen Description   Final    BLOOD LEFT ANTECUBITAL Performed at [facility] [address] [place], [place] [address]    Special Requests   Final    BOTTLES DRAWN AEROBIC ONLY Blood Culture adequate volume Performed at [facility] [address], [place], [place] [address]    Culture   Final    NO GROWTH < 24 HOURS Performed at [facility] [address] [place],  [address]    Report Status PENDING  Incomplete         Radiology Studies: DG Chest 1 View  Result Date: 12/20/2020 CLINICAL DATA:  Found on floor. EXAM: CHEST  1 VIEW COMPARISON:  05/26/2020 FINDINGS: Chronic elevation of the right hemidiaphragm. Left Port-A-Cath remains in place with the tip in the SVC. Mild cardiomegaly. No confluent airspace opacities or effusions. Bilateral shoulder replacements. No acute bony abnormality. IMPRESSION: Chronic elevation of the right hemidiaphragm. No acute cardiopulmonary disease. Electronically Signed   By: Rolm Baptise M.D.   On: 12/20/2020 10:15   DG Pelvis 1-2  Views  Result Date: 12/20/2020 CLINICAL DATA:  Fall EXAM: PELVIS - 1-2 VIEW COMPARISON:  CT pelvis 07/20/2020 FINDINGS: There is no acute fracture or dislocation. Femoroacetabular alignment is normal. The SI joints and symphysis pubis are intact. There is mild degenerative change of both hips. Lower lumbar spinal fusion hardware is partially imaged. IMPRESSION: No acute fracture or dislocation. Electronically Signed   By: Valetta Mole M.D.   On: 12/20/2020 10:17   DG Elbow Complete Left  Result Date: 12/20/2020 CLINICAL DATA:  Fall with left elbow pain EXAM: LEFT ELBOW - COMPLETE 3+ VIEW COMPARISON:  None. FINDINGS: There is no evidence of fracture, dislocation, or joint effusion. Generalized osteopenia. IMPRESSION: No acute finding. Electronically Signed   By: Gilford Silvius.D.  On: 12/20/2020 10:15   DG Tibia/Fibula Right  Result Date: 12/20/2020 CLINICAL DATA:  Right leg pain after fall. EXAM: RIGHT TIBIA AND FIBULA - 2 VIEW COMPARISON:  None. FINDINGS: Status post right knee and ankle arthroplasty. No fracture or dislocation is noted. IMPRESSION: No acute abnormality seen. Electronically Signed   By: Marijo Conception M.D.   On: 12/20/2020 10:19   CT Head Wo Contrast  Result Date: 12/20/2020 CLINICAL DATA:  Head trauma, minor (Age >= 65y) Fall. EXAM: CT HEAD WITHOUT CONTRAST TECHNIQUE: Contiguous axial images were obtained from the base of the skull through the vertex without intravenous contrast. COMPARISON:  05/25/2020 FINDINGS: Brain: Chronic microvascular disease throughout the deep white matter, stable since prior study. No acute intracranial abnormality. Specifically, no hemorrhage, hydrocephalus, mass lesion, acute infarction, or significant intracranial injury. Vascular: No hyperdense vessel or unexpected calcification. Skull: No acute calvarial abnormality. Sinuses/Orbits: No acute findings Other: None IMPRESSION: Similar chronic small vessel disease. No acute intracranial abnormality.  Electronically Signed   By: Rolm Baptise M.D.   On: 12/20/2020 10:29   CT Cervical Spine Wo Contrast  Result Date: 12/20/2020 CLINICAL DATA:  Neck trauma (Age >= 65y).  Fall. EXAM: CT CERVICAL SPINE WITHOUT CONTRAST TECHNIQUE: Multidetector CT imaging of the cervical spine was performed without intravenous contrast. Multiplanar CT image reconstructions were also generated. COMPARISON:  06/04/2016 FINDINGS: Alignment: Degenerative anterolisthesis of C3 on C4, C4 on C5 and C7 on T1, stable since prior study. Skull base and vertebrae: No acute fracture. No primary bone lesion or focal pathologic process. Soft tissues and spinal canal: No prevertebral fluid or swelling. No visible canal hematoma. Disc levels: Diffuse advanced degenerative disc disease and facet disease. Upper chest: No acute findings Other: None IMPRESSION: Diffuse advanced degenerative disc disease and facet disease. Multilevel degenerative anterolisthesis, stable since prior study. No acute bony abnormality. Electronically Signed   By: Rolm Baptise M.D.   On: 12/20/2020 10:32   DG Foot 2 Views Right  Result Date: 12/20/2020 CLINICAL DATA:  Right foot pain after fall. EXAM: RIGHT FOOT - 2 VIEW COMPARISON:  January 29, 2013. FINDINGS: Status post arthroplasty of talotibial joint. Status post surgical fusion of the first and second tarsometatarsal joints. Probable arthroplasty involving the third tarsometatarsal joint. Stable chronic resorption is seen involving the distal portions of the third, fourth and fifth metatarsals. No acute fracture or dislocation is noted. No soft tissue abnormality is noted. IMPRESSION: Chronic postsurgical and degenerative changes are noted as described above. No definite acute abnormality is seen. Electronically Signed   By: Marijo Conception M.D.   On: 12/20/2020 10:18   US Abdomen Limited RUQ (LIVER/GB)  Result Date: 12/20/2020 CLINICAL DATA:  Hyperbilirubinemia EXAM: ULTRASOUND ABDOMEN LIMITED RIGHT UPPER  QUADRANT COMPARISON:  CT abdomen/pelvis 10/14/2017, abdominal ultrasound 12/09/2006 FINDINGS: Gallbladder: No gallstones or wall thickening visualized. No sonographic Murphy sign noted by sonographer. Common bile duct: Diameter: 5 mm Liver: Evaluation of the liver is somewhat limited due to body habitus and lack of an acoustic window. Within these confines, no focal lesion identified. Within normal limits in parenchymal echogenicity. Portal vein is patent on color Doppler imaging with normal direction of blood flow towards the liver. Other: None. IMPRESSION: Evaluation of the liver is somewhat limited due to body habitus and lack of an acoustic window. Within these confines, unremarkable right upper quadrant ultrasound. No biliary ductal dilatation identified. Electronically Signed   By: Valetta Mole M.D.   On: 12/20/2020 15:56  Scheduled Meds:  apixaban  5 mg Oral BID   Chlorhexidine Gluconate Cloth  6 each Topical Daily   feeding supplement  237 mL Oral BID BM   FLUoxetine  40 mg Oral Daily   gabapentin  600 mg Oral QHS   melatonin  10 mg Oral QHS   multivitamin with minerals  1 tablet Oral Daily   predniSONE  15 mg Oral Daily   pyridOXINE  50 mg Oral Daily   sodium chloride flush  10-40 mL Intracatheter Q12H   vitamin B-12  1,000 mcg Oral Daily   Continuous Infusions:  sodium chloride 100 mL/hr at 12/20/20 1942   vancomycin 1,250 mg (12/21/20 1209)     LOS: 1 day    Time spent: 35 minutes    Irine Seal, MD [facility]   To contact the attending provider between 7A-7P or the covering provider during after hours 7P-7A, please log into the web site [email or web address] and access using universal [place] password for that web site. If you do not have the password, please call the hospital operator.  12/21/2020, 2:38 PM

## 2020-12-21 NOTE — Evaluation (Signed)
Occupational Therapy Evaluation Patient Details Name: [name] MRN: [id number] DOB: 1945/12/19 Today's Date: 12/21/2020   History of Present Illness Patient 75 year old gentleman history of malignant melanoma L thigh, paroxysmal A. fib, RA, COPD, HTN,hemochromatosis, chronic back pain, anxiety presented  12/20/20 after a fall, down several hours.  Patient denies any LOC and remembers tripping over a rug.  CT head CT C-spine done was negative.  Patient noted to be hypokalemic elevated also concern for left groin cellulitis with a leukocytosis and fever. .   Clinical Impression   [name] is a 75 year old man who presents with decreased ROM of bilateral shoulders (chronic), generalized weakness, decreased activity tolerance, impaired balance and pain resulting in a sudden decline in functional abilities. Patient overall min assist to stand and min guard to ambulate with RW and mod - max assist for LB ADLs. Patient can typically ambulate in his apartment and stand for ADLs as needed and currently needing setup and seated position. Patient complaints of lower extremity pain. Patient will benefit from skilled OT services while in hospital to improve deficits and learn compensatory strategies as needed in order to return to PLOF. Patient reports he will have assistance of his wife when she returns home tomorrow and wants to go home with [facility].      Recommendations for follow up therapy are one component of a multi-disciplinary discharge planning process, led by the attending physician.  Recommendations may be updated based on patient status, additional functional criteria and insurance authorization.   Follow Up Recommendations  Home health OT    Equipment Recommendations  None recommended by OT    Recommendations for Other Services       Precautions / Restrictions Precautions Precautions: Fall Precaution Comments: abrasions, pain in inner left thigh Restrictions Weight  Bearing Restrictions: No      Mobility Bed Mobility Overal bed mobility: Needs Assistance Bed Mobility: Supine to Sit     Supine to sit: HOB elevated;Min guard     General bed mobility comments: extra time, tends to rotate hips to scoot forward. Able to move legs over edge    Transfers Overall transfer level: Needs assistance Equipment used: Rolling walker (2 wheeled) Transfers: Sit to/from Stand Sit to Stand: Min assist         General transfer comment: extra boost to stand from bed. min guard to ambulate in hall with RW    Balance Overall balance assessment: Needs assistance Sitting-balance support: No upper extremity supported Sitting balance-Leahy Scale: Fair     Standing balance support: During functional activity;Bilateral upper extremity supported Standing balance-Leahy Scale: Poor Standing balance comment: leans  posterior when not supported with RW.                           ADL either performed or assessed with clinical judgement   ADL Overall ADL's : Needs assistance/impaired Eating/Feeding: Set up;Sitting   Grooming: Set up;Sitting   Upper Body Bathing: Set up;Sitting   Lower Body Bathing: Sit to/from stand;Moderate assistance   Upper Body Dressing : Moderate assistance;Sitting   Lower Body Dressing: Maximal assistance;Sit to/from stand   Toilet Transfer: Min guard;Regular Toilet;Grab bars;RW   Toileting- Architect and Hygiene: Moderate assistance;Sit to/from stand   Tub/ Shower Transfer: Min guard   Functional mobility during ADLs: Min guard;Rolling walker       Vision Patient Visual Report: No change from baseline       Perception  Praxis      Pertinent Vitals/Pain Pain Assessment: Faces Faces Pain Scale: Hurts little more Pain Location: legs, back Pain Descriptors / Indicators: Discomfort;Aching Pain Intervention(s): Monitored during session;Premedicated before session     Hand Dominance Right    Extremity/Trunk Assessment Upper Extremity Assessment Upper Extremity Assessment: RUE deficits/detail;LUE deficits/detail RUE Deficits / Details: Approx 3-/5 shoulder strength, otherwise 4/5 strength throughout, arthritic changes noted in hand RUE Sensation: WNL RUE Coordination: WNL LUE Deficits / Details: Approx 2-/5 shoulder strength, otherwise 4/5 strength throughout, arthritic changes noted in hand LUE Sensation: WNL LUE Coordination: WNL   Lower Extremity Assessment Lower Extremity Assessment: Defer to PT evaluation RLE Deficits / Details: charcot  foot deformity, edema, WFL for amb. LLE Deficits / Details: edema > rt, charcot deformity   Cervical / Trunk Assessment Cervical / Trunk Assessment: Other exceptions Cervical / Trunk Exceptions: limited neck ROM, limited trunk rotation   Communication Communication Communication: No difficulties   Cognition Arousal/Alertness: Awake/alert Behavior During Therapy: WFL for tasks assessed/performed Overall Cognitive Status: Within Functional Limits for tasks assessed                                     General Comments       Exercises     Shoulder Instructions      Home Living Family/patient expects to be discharged to:: Private residence Living Arrangements: Spouse/significant other Available Help at Discharge: Available 24 hours/day Type of Home: Independent living facility Home Access: Level entry     Home Layout: One level     Bathroom Shower/Tub: Walk-in shower;Curtain   Bathroom Toilet: Handicapped height     Home Equipment: Environmental consultant - 2 wheels   Additional Comments: spouse out of town until 9/16      Prior Functioning/Environment Level of Independence: Needs assistance  Gait / Transfers Assistance Needed: normally does not use RW. ADL's / Homemaking Assistance Needed: has difficulty with socks, lower body dressing.   Comments: Sleeps in a lift char        OT Problem List: Decreased  strength;Decreased range of motion;Decreased activity tolerance;Impaired balance (sitting and/or standing);Decreased knowledge of use of DME or AE;Pain;Impaired UE functional use      OT Treatment/Interventions: Self-care/ADL training;Therapeutic exercise;DME and/or AE instruction;Therapeutic activities;Balance training;Patient/family education    OT Goals(Current goals can be found in the care plan section) Acute Rehab OT Goals Patient Stated Goal: improve abiities and go home at discharge OT Goal Formulation: With patient Time For Goal Achievement: 01/04/21 Potential to Achieve Goals: Good  OT Frequency: Min 2X/week   Barriers to D/C:            Co-evaluation PT/OT/SLP Co-Evaluation/Treatment: Yes Reason for Co-Treatment: For patient/therapist safety PT goals addressed during session: Mobility/safety with mobility OT goals addressed during session: ADL's and self-care      AM-PAC OT "6 Clicks" Daily Activity     Outcome Measure Help from another person eating meals?: A Little Help from another person taking care of personal grooming?: A Little Help from another person toileting, which includes using toliet, bedpan, or urinal?: A Lot Help from another person bathing (including washing, rinsing, drying)?: A Lot Help from another person to put on and taking off regular upper body clothing?: A Little Help from another person to put on and taking off regular lower body clothing?: A Lot 6 Click Score: 15   End of Session Equipment Utilized During Treatment: Gait  belt;Rolling walker Nurse Communication: Mobility status  Activity Tolerance: Patient tolerated treatment well Patient left: in chair;with call bell/phone within reach  OT Visit Diagnosis: Unsteadiness on feet (R26.81);Pain;Muscle weakness (generalized) (M62.81)                Time: 8295-6213 OT Time Calculation (min): 19 min Charges:  OT General Charges $OT Visit: 1 Visit OT Evaluation $OT Eval Moderate Complexity:  1 Mod  Brylyn Novakovich, OTR/L Acute Care Rehab Services  Office [phone] Pager: [phone]   Kelli Churn 12/21/2020, 4:01 PM

## 2020-12-21 NOTE — Evaluation (Signed)
Physical Therapy Evaluation Patient Details Name: [name] MRN: [id number] DOB: [birth year] Today's Date: 12/21/2020  History of Present Illness  Patient 75 year old gentleman history of malignant melanoma L thigh, paroxysmal A. fib, RA, COPD, HTN,hemochromatosis, chronic back pain, anxiety presented  12/20/20 after a fall, down several hours.  Patient denies any LOC and remembers tripping over a rug.  CT head CT C-spine done was negative.  Patient noted to be hypokalemic elevated also concern for left groin cellulitis with a leukocytosis and fever. .  Clinical Impression  The patient initially did not  feel that he could get up. Patient did  mobilize to sitting , Stood with min/mod boost to stand, patient ambulated with min guard/min assist for safety x 60'. On RA SPO2 dropped to 87% from 965 on 2 L.  Patient encouraged to use RW and scooter until strength improves.  At baseline in the distant past, , did not use the RW.  Pt admitted with above diagnosis.  Pt currently with functional limitations due to the deficits listed below (see PT Problem List). Pt will benefit from skilled PT to increase their independence and safety with mobility to allow discharge to the venue listed below.        Recommendations for follow up therapy are one component of a multi-disciplinary discharge planning process, led by the attending physician.  Recommendations may be updated based on patient status, additional functional criteria and insurance authorization.  Follow Up Recommendations Home health PT    Equipment Recommendations  None recommended by PT    Recommendations for Other Services       Precautions / Restrictions Precautions Precautions: Fall Precaution Comments: abrasions, pain in inner left thigh Restrictions Weight Bearing Restrictions: No      Mobility  Bed Mobility Overal bed mobility: Needs Assistance Bed Mobility: Supine to Sit     Supine to sit: HOB elevated;Min guard      General bed mobility comments: extra time, tends to rotate hips to scoot forward. Able to move legs over edge    Transfers Overall transfer level: Needs assistance Equipment used: Rolling walker (2 wheeled) Transfers: Sit to/from Stand Sit to Stand: Min assist         General transfer comment: extra boost to stand from bed.  Ambulation/Gait Ambulation/Gait assistance: Min assist;Min guard Gait Distance (Feet): 60 Feet Assistive device: Rolling walker (2 wheeled) Gait Pattern/deviations: Step-to pattern;Step-through pattern;Decreased stride length        Stairs            Wheelchair Mobility    Modified Rankin (Stroke Patients Only)       Balance Overall balance assessment: Needs assistance;History of Falls Sitting-balance support: No upper extremity supported;Feet supported Sitting balance-Leahy Scale: Fair     Standing balance support: During functional activity;Bilateral upper extremity supported Standing balance-Leahy Scale: Poor Standing balance comment: leans  posterior when not supported with RW.                             Pertinent Vitals/Pain Pain Assessment: Faces Faces Pain Scale: Hurts little more Pain Location: legs, back Pain Descriptors / Indicators: Discomfort;Aching Pain Intervention(s): Monitored during session;Premedicated before session    Home Living Family/patient expects to be discharged to:: Private residence Living Arrangements: Spouse/significant other Available Help at Discharge: Available 24 hours/day Type of Home: Independent living facility Home Access: Level entry     Home Layout: One level Home Equipment: Environmental consultant - 2 wheels  Additional Comments: spouse out of town until 9/16    Prior Function Level of Independence: Needs assistance   Gait / Transfers Assistance Needed: normally does not use RW.  ADL's / Homemaking Assistance Needed: has difficulty with socks, lower body dressing.  Comments: Sleeps  in a lift char     Hand Dominance   Dominant Hand: Right    Extremity/Trunk Assessment   Upper Extremity Assessment Upper Extremity Assessment: Defer to OT evaluation    Lower Extremity Assessment Lower Extremity Assessment: RLE deficits/detail;LLE deficits/detail RLE Deficits / Details: charcot  foot deformity, edema, WFL for amb. LLE Deficits / Details: edema > rt, charcot deformity    Cervical / Trunk Assessment Cervical / Trunk Assessment: Other exceptions Cervical / Trunk Exceptions: limited neck ROM, limited trunk rotation  Communication   Communication: No difficulties  Cognition Arousal/Alertness: Awake/alert Behavior During Therapy: WFL for tasks assessed/performed Overall Cognitive Status: Within Functional Limits for tasks assessed                                        General Comments      Exercises     Assessment/Plan    PT Assessment Patient needs continued PT services  PT Problem List Decreased strength;Decreased mobility;Decreased safety awareness;Decreased range of motion;Decreased knowledge of precautions;Decreased activity tolerance;Decreased balance;Cardiopulmonary status limiting activity;Pain       PT Treatment Interventions DME instruction;Therapeutic activities;Gait training;Therapeutic exercise;Patient/family education;Functional mobility training;Balance training    PT Goals (Current goals can be found in the Care Plan section)  Acute Rehab PT Goals Patient Stated Goal: go home PT Goal Formulation: With patient Time For Goal Achievement: 01/04/21 Potential to Achieve Goals: Good    Frequency Min 3X/week   Barriers to discharge        Co-evaluation PT/OT/SLP Co-Evaluation/Treatment: Yes Reason for Co-Treatment: For patient/therapist safety PT goals addressed during session: Mobility/safety with mobility OT goals addressed during session: ADL's and self-care       AM-PAC PT "6 Clicks" Mobility  Outcome  Measure Help needed turning from your back to your side while in a flat bed without using bedrails?: A Little Help needed moving from lying on your back to sitting on the side of a flat bed without using bedrails?: A Little Help needed moving to and from a bed to a chair (including a wheelchair)?: A Lot Help needed standing up from a chair using your arms (e.g., wheelchair or bedside chair)?: A Little Help needed to walk in hospital room?: A Little Help needed climbing 3-5 steps with a railing? : A Lot 6 Click Score: 16    End of Session Equipment Utilized During Treatment: Gait belt Activity Tolerance: Patient tolerated treatment well Patient left: in chair;with call bell/phone within reach;with chair alarm set Nurse Communication: Mobility status PT Visit Diagnosis: Unsteadiness on feet (R26.81);History of falling (Z91.81);Difficulty in walking, not elsewhere classified (R26.2);Pain Pain - Right/Left: Left Pain - part of body: Leg    Time: 4562-5638 PT Time Calculation (min) (ACUTE ONLY): 28 min   Charges:   PT Evaluation $PT Eval Low Complexity: [facility] Acute Rehabilitation Services Pager [phone] Office [phone]  Claretha Cooper 12/21/2020, 3:57 PM

## 2020-12-22 ENCOUNTER — Inpatient Hospital Stay (HOSPITAL_COMMUNITY): Payer: Medicare Other

## 2020-12-22 ENCOUNTER — Encounter (HOSPITAL_COMMUNITY): Payer: Self-pay | Admitting: Internal Medicine

## 2020-12-22 DIAGNOSIS — F419 Anxiety disorder, unspecified: Secondary | ICD-10-CM | POA: Diagnosis not present

## 2020-12-22 DIAGNOSIS — C4372 Malignant melanoma of left lower limb, including hip: Secondary | ICD-10-CM

## 2020-12-22 DIAGNOSIS — W19XXXA Unspecified fall, initial encounter: Secondary | ICD-10-CM | POA: Diagnosis not present

## 2020-12-22 DIAGNOSIS — S71109A Unspecified open wound, unspecified thigh, initial encounter: Secondary | ICD-10-CM

## 2020-12-22 DIAGNOSIS — L03314 Cellulitis of groin: Secondary | ICD-10-CM | POA: Diagnosis not present

## 2020-12-22 DIAGNOSIS — D649 Anemia, unspecified: Secondary | ICD-10-CM | POA: Diagnosis not present

## 2020-12-22 DIAGNOSIS — E860 Dehydration: Secondary | ICD-10-CM

## 2020-12-22 DIAGNOSIS — A498 Other bacterial infections of unspecified site: Secondary | ICD-10-CM

## 2020-12-22 DIAGNOSIS — L03116 Cellulitis of left lower limb: Secondary | ICD-10-CM

## 2020-12-22 DIAGNOSIS — R509 Fever, unspecified: Secondary | ICD-10-CM

## 2020-12-22 DIAGNOSIS — S71102A Unspecified open wound, left thigh, initial encounter: Secondary | ICD-10-CM

## 2020-12-22 DIAGNOSIS — E876 Hypokalemia: Secondary | ICD-10-CM

## 2020-12-22 LAB — BASIC METABOLIC PANEL
Anion gap: 2 — ABNORMAL LOW (ref 5–15)
BUN: 26 mg/dL — ABNORMAL HIGH (ref 8–23)
CO2: 31 mmol/L (ref 22–32)
Calcium: 7.7 mg/dL — ABNORMAL LOW (ref 8.9–10.3)
Chloride: 105 mmol/L (ref 98–111)
Creatinine, Ser: 0.78 mg/dL (ref 0.61–1.24)
GFR, Estimated: >60 mL/min (ref >60)
Glucose, Bld: 99 mg/dL (ref 70–99)
Potassium: 4.3 mmol/L (ref 3.5–5.1)
Sodium: 138 mmol/L (ref 135–145)

## 2020-12-22 LAB — CBC WITH DIFFERENTIAL/PLATELET
Abs Immature Granulocytes: 0.04 10*3/uL (ref 0.00–0.07)
Basophils Absolute: 0 10*3/uL (ref 0.0–0.1)
Basophils Relative: 0 %
Eosinophils Absolute: 0 10*3/uL (ref 0.0–0.5)
Eosinophils Relative: 0 %
HCT: 34.4 % — ABNORMAL LOW (ref 39.0–52.0)
Hemoglobin: 10.4 g/dL — ABNORMAL LOW (ref 13.0–17.0)
Immature Granulocytes: 0 %
Lymphocytes Relative: 6 %
Lymphs Abs: 0.7 10*3/uL (ref 0.7–4.0)
MCH: 30.5 pg (ref 26.0–34.0)
MCHC: 30.2 g/dL (ref 30.0–36.0)
MCV: 100.9 fL — ABNORMAL HIGH (ref 80.0–100.0)
Monocytes Absolute: 0.6 10*3/uL (ref 0.1–1.0)
Monocytes Relative: 6 %
Neutro Abs: 9.4 10*3/uL — ABNORMAL HIGH (ref 1.7–7.7)
Neutrophils Relative %: 88 %
Platelets: 136 10*3/uL — ABNORMAL LOW (ref 150–400)
RBC: 3.41 MIL/uL — ABNORMAL LOW (ref 4.22–5.81)
RDW: 13.4 % (ref 11.5–15.5)
WBC: 10.7 10*3/uL — ABNORMAL HIGH (ref 4.0–10.5)
nRBC: 0 % (ref 0.0–0.2)

## 2020-12-22 MED ORDER — METOPROLOL TARTRATE 25 MG PO TABS
25.0000 mg | ORAL_TABLET | Freq: Two times a day (BID) | ORAL | Status: DC
  Administered 2020-12-22 – 2020-12-27 (×11): 25 mg via ORAL
  Filled 2020-12-22 (×12): qty 1

## 2020-12-22 MED ORDER — POTASSIUM CHLORIDE CRYS ER 20 MEQ PO TBCR
40.0000 meq | EXTENDED_RELEASE_TABLET | Freq: Once | ORAL | Status: AC
  Administered 2020-12-22: 40 meq via ORAL
  Filled 2020-12-22: qty 2

## 2020-12-22 MED ORDER — IPRATROPIUM-ALBUTEROL 0.5-2.5 (3) MG/3ML IN SOLN
3.0000 mL | Freq: Four times a day (QID) | RESPIRATORY_TRACT | Status: DC
  Administered 2020-12-22: 3 mL via RESPIRATORY_TRACT
  Filled 2020-12-22: qty 3

## 2020-12-22 MED ORDER — IPRATROPIUM-ALBUTEROL 0.5-2.5 (3) MG/3ML IN SOLN
3.0000 mL | Freq: Two times a day (BID) | RESPIRATORY_TRACT | Status: DC
  Administered 2020-12-22 – 2020-12-23 (×2): 3 mL via RESPIRATORY_TRACT
  Filled 2020-12-22 (×2): qty 3

## 2020-12-22 MED ORDER — FUROSEMIDE 10 MG/ML IJ SOLN
20.0000 mg | Freq: Once | INTRAMUSCULAR | Status: AC
  Administered 2020-12-22: 20 mg via INTRAVENOUS
  Filled 2020-12-22: qty 2

## 2020-12-22 NOTE — Progress Notes (Signed)
LLE venous duplex has been completed.  Results can be found under chart review under CV PROC. 12/22/2020 3:34 PM Jahyra Sukup RVT, RDMS

## 2020-12-22 NOTE — Consult Note (Signed)
Date of Admission:  12/20/2020          Reason for Consult:  Infected soft tissue left thigh +/- abscess site of chronic wound  Referring Provider: Niel Hummer, MD   Assessment:  Left thigh infection, rule out abscess at site of chronic wound Several chronic wounds at site of melanoma excision and lymph nodes excision History of of prior postoperative infection in his lumbar spine due to Pseudomonas aeruginosa treated with IV cefepime then with ciprofloxacin This and recent mechanical fall though question whether there could have been an element of syncope History of rheumatoid arthritis Coronary artery disease Atrial fibrillation  Plan:  Continue vancomycin for present time Follow-up the ultrasound of his leg and how he progresses If he needs surgery please send cultures for aerobic and anaerobic cultures If we change him to oral therapy I would prefer to cover MRSA and also streptococcal species  (absent a culture to guide therapy)something such as for ex BID doxycycline and BID cefadroxil  Dr. Juleen China is available this can for questions and I will be back on Monday.    Active Problems:   Dehydration   Cellulitis   Pressure injury of skin   Fall   Hyperbilirubinemia   Malignant melanoma (HCC)   Scheduled Meds:  apixaban  5 mg Oral BID   Chlorhexidine Gluconate Cloth  6 each Topical Daily   feeding supplement  237 mL Oral BID BM   FLUoxetine  40 mg Oral Daily   gabapentin  600 mg Oral QHS   ipratropium-albuterol  3 mL Nebulization BID   melatonin  10 mg Oral QHS   metoprolol tartrate  25 mg Oral BID   multivitamin with minerals  1 tablet Oral Daily   predniSONE  15 mg Oral Daily   pyridOXINE  50 mg Oral Daily   sodium chloride flush  10-40 mL Intracatheter Q12H   vitamin B-12  1,000 mcg Oral Daily   Continuous Infusions:  vancomycin 1,250 mg (12/22/20 1534)   PRN Meds:.albuterol, ALPRAZolam, fluticasone, HYDROmorphone, polyvinyl alcohol,  temazepam  HPI: [name] is a 75 y.o. male with multiple medical problems that we had previously taken care of in the past when he had a severe postoperative lumbar infection with Pseudomonas aeruginosa that we treated with cefepime and later ciprofloxacin) he thought to have potential adverse reaction to cefepime in the interim).  He has also been diagnosed with malignant melanoma and is status post resection as well as lymph node excision on Aug 15, 2020.  He has had troubles ever since with seromas and healing up the wound.  He has been seeing Dr. Barry Dienes every weeks for wound care management.  Roughly a week ago he started developing more erythema along the medial more distal wound of his upper thigh which became more intensely erythematous and he began to have systemic symptoms including fevers and weakness.  He then had a mechanical fall at home with possible occult loss of consciousness.  Brought to the emergency department where a blood culture was taken from his port (note it should not of been taken from the port but should have been taken peripheral) he was started on vancomycin which she has been on overnight.  He does not himself believe that her been much improvement in the wound.  He has been evaluated by general surgery who have ordered an ultrasound of the area to look for deeper abscess.  Certainly if he has a deeper abscess would recommend  I&D so that it can be controlled surgically and we can send for cultures to guide antibiotic therapy.  He did grow Citrobacter from a wound swab done in mid August but the evidence of this culture is questionable.  Certainly if he improves on vancomycin I would discharge him on Keflex and doxycycline to give [place] good streptococcal as well as MRSA coverage.  Partner Dr. Juleen China is available for questions over the weekend and will follow up on microbiological data.  I will be back on Monday.  I spent 82 minutes with the patient  including than 50% of the time in face to face counseling of the patient regarding the nature of his soft tissue infection his prior history Of lumbar infection, personally reviewing his chest x-ray from the 16th today shows a small pleural effusion as well as his CBC CMP blood culture prior wound cultures of the along with review of medical records in preparation for the visit and during the visit and in coordination of his care.    Review of Systems: Review of Systems  Constitutional:  Positive for chills, fever and malaise/fatigue. Negative for diaphoresis and weight loss.  HENT:  Negative for congestion, hearing loss, sore throat and tinnitus.   Eyes:  Negative for blurred vision and double vision.  Respiratory:  Negative for cough, sputum production, shortness of breath and wheezing.   Cardiovascular:  Negative for chest pain, palpitations and leg swelling.  Gastrointestinal:  Positive for nausea. Negative for abdominal pain, blood in stool, constipation, diarrhea, heartburn, melena and vomiting.  Genitourinary:  Negative for dysuria, flank pain and hematuria.  Musculoskeletal:  Positive for falls and myalgias. Negative for back pain and joint pain.  Skin:  Negative for itching and rash.  Neurological:  Positive for dizziness, loss of consciousness and weakness. Negative for sensory change, focal weakness and headaches.  Endo/Heme/Allergies:  Does not bruise/bleed easily.  Psychiatric/Behavioral:  Negative for depression, memory loss and suicidal ideas. The patient is not nervous/anxious.    Past Medical History:  Diagnosis Date   Alcoholism /alcohol abuse    per family   Allergic rhinitis    Allergy    Anxiety    Bacterial infection    CAD (coronary artery disease)    minimal coronary plaque in the LAD and right coronary system. 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 7-06. Last nuclear stress 11-17-06 fixed anterior/  inferior defect, no inducible ischemia, EF 81%   Cataract    CHF (congestive heart failure) (HCC)    Chronic back pain    "all over back"   Chronic neck pain    COPD (chronic obstructive pulmonary disease) ([place])    Depression    Diverticulosis    DVT (deep venous thrombosis) ([place]) 06/2020   Dyspnea    with exertion   Dysrhythmia 01/24/2012   past hx. A.Fib x1 episode-responded to med.   Falls frequently    "since 02/2013" (06/16/2013)   Family history of cancer    Genetic testing 03/06/2017   Multi-Cancer panel (83 genes) @ [facility] - No pathogenic mutations detected   GERD (gastroesophageal reflux disease)    Goals of care, counseling/discussion 10/12/2020   Hemochromatosis    dx'd 14 yrs ago last ferritin Aug 11, 08 52 (22-322), Fe 136 ("I had 250 phlebotomies for that")   High cholesterol    hx   History of radiation therapy 11/06/2020   left inguinal thigh 10/10/2020-11/06/2020  Dr Gery Pray  Hx of colonic polyps    Hx of colonoscopy    Hypertension    Iron deficiency anemia due to chronic blood loss 12/27/2016   Malignant melanoma of knee, left (HCC)    Myocardial infarction [facility]) 2006   "related to catheterization"   Narcotic abuse ([place])    per family   Neuropathy    fingers and toes   Osteoarthritis    Peripheral edema    Bilateral ankles   PONV (postoperative nausea and vomiting)    Pulmonary embolism (HCC) 06/2020   RA (rheumatoid arthritis) (HCC)     Social History   Tobacco Use   Smoking status: Never   Smokeless tobacco: Never  Vaping Use   Vaping Use: Never used  Substance Use Topics   Alcohol use: No    Alcohol/week: 0.0 standard drinks    Comment: "I do not drink anymore"   Drug use: No    Family History  Problem Relation Age of Onset   Uterine cancer Mother        uterine vs. cervix? dx 28s; deceased 82   Macular degeneration Mother    Other Mother        ankle edema   Lung cancer Mother    Melanoma Mother        multiple spots on  legs   Cancer - Lung Mother        smoker   Coronary artery disease Father    Hypertension Father    Prostate cancer Father    Colon polyps Father    Cancer - Prostate Father 50       deceased 63   Heart attack Brother    Hyperlipidemia Brother    Other Brother        Schizophrenic   Thyroid disease Sister    Hemochromatosis Sister    Rheum arthritis Sister    Coronary artery disease Maternal Aunt    Heart attack Maternal Aunt    Cancer Maternal Grandmother        unk. type; deceased 9s   Diabetes Neg Hx    Colon cancer Neg Hx    Esophageal cancer Neg Hx    Rectal cancer Neg Hx    Stomach cancer Neg Hx    Allergies  Allergen Reactions   Cefepime Hives and Shortness Of Breath   Cephaeline Hives   Morphine Swelling    A swollen stomach.   Morphine And Related Shortness Of Breath, Nausea And Vomiting, Swelling and Other (See Comments)    Agitation, tolerates dilaudid Other reaction(s): Other (See Comments) Agitation, tolerates dilaudid   Penicillins Hives, Shortness Of Breath and Rash    Has patient had a PCN reaction causing immediate rash, facial/tongue/throat swelling, SOB or lightheadedness with hypotension: Yes Has patient had a PCN reaction causing severe rash involving mucus membranes or skin necrosis: Yes Has patient had a PCN reaction that required hospitalization No Has patient had a PCN reaction occurring within the last 10 years: No If all of the above answers are "NO", then may proceed with Cephalosporin use.    Doxycycline Rash   Oxycodone-Acetaminophen Other (See Comments)    Patient doesn't remember what type of reaction.    OBJECTIVE: Blood pressure (!) 142/109, pulse 94, temperature 98.3 F (36.8 C), temperature source Oral, resp. rate 20, SpO2 97 %.  Physical Exam Constitutional:      Appearance: He is well-developed.  HENT:     Head: Normocephalic and atraumatic.  Eyes:  Conjunctiva/sclera: Conjunctivae normal.  Cardiovascular:     Rate  and Rhythm: Normal rate and regular rhythm.     Heart sounds: No murmur heard.   No gallop.  Pulmonary:     Effort: Pulmonary effort is normal. No respiratory distress.     Breath sounds: No stridor. No wheezing or rhonchi.  Abdominal:     General: There is no distension.     Palpations: Abdomen is soft. There is no mass.  Musculoskeletal:        General: No tenderness. Normal range of motion.     Cervical back: Normal range of motion and neck supple.  Skin:    General: Skin is warm and dry.     Coloration: Skin is not pale.     Findings: No erythema or rash.  Neurological:     General: No focal deficit present.     Mental Status: He is alert and oriented to person, place, and time.  Psychiatric:        Mood and Affect: Mood normal.        Behavior: Behavior normal.        Thought Content: Thought content normal.        Judgment: Judgment normal.    Left medial wound healed 12/22/2020:     Left distal wound 12/22/2020:         Lab Results Lab Results  Component Value Date   WBC 10.7 (H) 12/22/2020   HGB 10.4 (L) 12/22/2020   HCT 34.4 (L) 12/22/2020   MCV 100.9 (H) 12/22/2020   PLT 136 (L) 12/22/2020    Lab Results  Component Value Date   CREATININE 0.78 12/22/2020   BUN 26 (H) 12/22/2020   NA 138 12/22/2020   K 4.3 12/22/2020   CL 105 12/22/2020   CO2 31 12/22/2020    Lab Results  Component Value Date   ALT 26 12/21/2020   AST 40 12/21/2020   ALKPHOS 44 12/21/2020   BILITOT 0.9 12/21/2020     Microbiology: Recent Results (from the past 240 hour(s))  Culture, blood (routine x 2)     Status: None (Preliminary result)   Collection Time: 12/20/20  9:21 AM   Specimen: BLOOD  Result Value Ref Range Status   Specimen Description   Final    BLOOD PORTA CATH Performed at [facility], [place] [address], [place], [place] [address]    Special Requests   Final    BOTTLES DRAWN AEROBIC AND ANAEROBIC Blood Culture adequate volume Performed  at [place] [address], [place], [place] [address]    Culture   Final    NO GROWTH 2 DAYS Performed at [place] [address] [place], [place] [address]    Report Status PENDING  Incomplete  Resp Panel by RT-PCR (Flu A&B, Covid) Nasopharyngeal Swab     Status: None   Collection Time: 12/20/20  9:22 AM   Specimen: Nasopharyngeal Swab; Nasopharyngeal(NP) swabs in vial transport medium  Result Value Ref Range Status   SARS Coronavirus 2 by RT PCR NEGATIVE NEGATIVE Final    Comment: (NOTE) SARS-CoV-2 target nucleic acids are NOT DETECTED.  The SARS-CoV-2 RNA is generally detectable in upper respiratory specimens during the acute phase of infection. The lowest concentration of SARS-CoV-2 viral copies this assay can detect is 138 copies/mL. A negative result does not preclude SARS-Cov-2 infection and should not be used as the sole basis for treatment or other patient management decisions.  A negative result may occur with  improper specimen collection/handling, submission of specimen other than nasopharyngeal swab, presence of viral mutation(s) within the areas targeted by this assay, and inadequate number of viral copies(<138 copies/mL). A negative result must be combined with clinical observations, patient history, and epidemiological information. The expected result is Negative.  Fact Sheet for Patients:  EntrepreneurPulse.com.au  Fact Sheet for Healthcare Providers:  IncredibleEmployment.be  This test is no t yet approved or cleared by the [place] FDA and  has been authorized for detection and/or diagnosis of SARS-CoV-2 by FDA under an Emergency Use Authorization (EUA). This EUA will remain  in effect (meaning this test can be used) for the duration of the COVID-19 declaration under Section 564(b)(1) of the Act, 21 U.S.C.section 360bbb-3(b)(1), unless the authorization is terminated  or revoked  sooner.       Influenza A by PCR NEGATIVE NEGATIVE Final   Influenza B by PCR NEGATIVE NEGATIVE Final    Comment: (NOTE) The Xpert Xpress SARS-CoV-2/FLU/RSV plus assay is intended as an aid in the diagnosis of influenza from Nasopharyngeal swab specimens and should not be used as a sole basis for treatment. Nasal washings and aspirates are unacceptable for Xpert Xpress SARS-CoV-2/FLU/RSV testing.  Fact Sheet for Patients: EntrepreneurPulse.com.au  Fact Sheet for Healthcare Providers: IncredibleEmployment.be  This test is not yet approved or cleared by the [place] FDA and has been authorized for detection and/or diagnosis of SARS-CoV-2 by FDA under an Emergency Use Authorization (EUA). This EUA will remain in effect (meaning this test can be used) for the duration of the COVID-19 declaration under Section 564(b)(1) of the Act, 21 U.S.C. section 360bbb-3(b)(1), unless the authorization is terminated or revoked.  Performed at [facility], [place] [address], [place], [place] [address]   Culture, blood (routine x 2)     Status: None (Preliminary result)   Collection Time: 12/20/20  9:26 AM   Specimen: BLOOD  Result Value Ref Range Status   Specimen Description   Final    BLOOD LEFT ANTECUBITAL Performed at [place] [address], [place], [place] [address]    Special Requests   Final    BOTTLES DRAWN AEROBIC ONLY Blood Culture adequate volume Performed at [place] [address] [place], [place] [address]    Culture   Final    NO GROWTH 2 DAYS Performed at [place] [address], [place], [place] [address]    Report Status PENDING  Incomplete    Alcide Evener, [facility] [phone] pager  12/22/2020, 4:28 PM

## 2020-12-22 NOTE — Consult Note (Signed)
Referral MD  Reason for Referral: Recurrent melanoma-status post lymphadenectomy and radiation therapy; now with weakness  Chief Complaint  Patient presents with   Fall   Back Pain   Leg Pain  : I fell at home.  HPI: [name] is well-known to me.  He is a 75 year old white male.  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  He had a left groin lymphadenectomy.  He then had radiation therapy.  He completed radiation therapy back in early August.  He is on Eliquis because of a pulmonary embolus and lower extremity thrombosis.  He was at home recently.  His wife got up to regimen.  He apparently fell in the bathroom.  He was down overnight.  He finally was found.  He came to the emergency room.  He was admitted on 12/20/2020.  Has had infection in the left thigh.  He has had Citrobacter growing out of the wound.  He had lab work on admission which showed white cell count 12.7.  Hemoglobin 10.7.  Platelet count 114,000.  His BUN was 30 and creatinine 1.06.  Albumin was 2.9.  When I saw him last in the office, he had a lot of lymphedema in the legs.  More so in the left leg than right leg.  This certainly does seem to be better now.  There is a question of infection.  I think he is on antibiotics.  Cultures have been taken.  Thankfully, blood cultures have are are negative.  He has had no fever.  He is on Eliquis.  He has a lot of bruising.  He has had no problems with diarrhea.  He is incredibly weak.  He is somewhat deconditioned.  He was to have a PET scan earlier this week.  Unfortunately he he cannot have this done.  He has had no obvious bleeding.  There is been no headache.  He had scans done of his head.  These were negative.  He had a CT scan of the cervical spine.  This is showed advanced degenerative disc disease and facet disease.  He really seems better than I would have thought.  He is alert.  He is quite  His appetite is down.  He just  does not feel like eating much.  There is no vomiting.  He has some nausea.  Currently, I would say performance status is ECOG 2.  At   Past Medical History:  Diagnosis Date   Alcoholism /alcohol abuse    per family   Allergic rhinitis    Allergy    Anxiety    Bacterial infection    CAD (coronary artery disease)    minimal coronary plaque in the LAD and right coronary system. 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 7-06. Last nuclear stress 11-17-06 fixed anterior/ inferior defect, no inducible ischemia, EF 81%   Cataract    CHF (congestive heart failure) (HCC)    Chronic back pain    "all over back"   Chronic neck pain    COPD (chronic obstructive pulmonary disease) ([place])    Depression    Diverticulosis    DVT (deep venous thrombosis) ([place]) 06/2020   Dyspnea    with exertion   Dysrhythmia 01/24/2012   past hx. A.Fib x1 episode-responded to med.   Falls frequently    "since 02/2013" (06/16/2013)   Family history of cancer    Genetic testing 03/06/2017   Multi-Cancer  panel (83 genes) @ [facility] - No pathogenic mutations detected   GERD (gastroesophageal reflux disease)    Goals of care, counseling/discussion 10/12/2020   Hemochromatosis    dx'd 14 yrs ago last ferritin Aug 11, 08 52 (22-322), Fe 136 ("I had 250 phlebotomies for that")   High cholesterol    hx   History of radiation therapy 11/06/2020   left inguinal thigh 10/10/2020-11/06/2020  Dr Gery Pray   Hx of colonic polyps    Hx of colonoscopy    Hypertension    Iron deficiency anemia due to chronic blood loss 12/27/2016   Malignant melanoma of knee, left ([place])    Myocardial infarction [facility]) 2006   "related to catheterization"   Narcotic abuse ([place])    per family   Neuropathy    fingers and toes   Osteoarthritis    Peripheral edema    Bilateral ankles   PONV (postoperative nausea and vomiting)    Pulmonary embolism ([place]) 06/2020   RA  (rheumatoid arthritis) ([place])   :   Past Surgical History:  Procedure Laterality Date   ABDOMINAL ADHESION SURGERY  ~ 1968   ANKLE RECONSTRUCTION Right 6-09   [facility]   APPENDECTOMY  ~ [place] Left 03/27/2017   Procedure: ASPIRATION OF SEROMA;  Surgeon: Stark Klein, MD;  Location: [facility];  Service: General;  Laterality: Left;   BONE TUMOR RESECTION  ~ 1954   "taken off my mastoid"   [place] Right 1990's   CATARACT EXTRACTION, BILATERAL Bilateral 01-24-12   CORONARY ANGIOPLASTY WITH STENT PLACEMENT  2006   "while repairing 1st stent, a second area tore and they had to place 2nd stent " ?LAD & CX   CORONARY ANGIOPLASTY WITH STENT PLACEMENT  06/17/2014   CYST EXCISION  "several OR's"   "backX 2, back of my neck, face, inside right bicept, chest, wrist"   FOOT SURGERY Right 11-08   for removal of bone spurs-   HAMMER TOE SURGERY Right 07/2012   "broke 4 hammertoes"    HARDWARE REMOVAL  03/09/2012   Procedure: HARDWARE REMOVAL;  Surgeon: Nita Sells, MD;  Location: [facility];  Service: Orthopedics;  Laterality: Left;  Hardware Removal from Left Shoulder   HARVEST BONE GRAFT  02/06/2012   Procedure: HARVEST ILIAC BONE GRAFT;  Surgeon: Nita Sells, MD;  Location[facility];  Service: Orthopedics;;  bone marrow aspirqation    INGUINAL HERNIA REPAIR Bilateral    JOINT REPLACEMENT     KNEE SURGERY Left ~ 2003   "6-12 months after uni knee removed synovial sack"   LEFT HEART CATHETERIZATION WITH CORONARY ANGIOGRAM N/A 06/17/2014   PCI of diffuse severe stenosis in the proximal to mid LAD using overlapping drug-eluting stents.   LUMBAR WOUND DEBRIDEMENT N/A 03/17/2013   Procedure: Incision and drainage of superficial lumbar wound;  Surgeon: Floyce Stakes, MD;  Location: [facility];  Service: Neurosurgery;  Laterality: N/A;  Incision and drainage of superficial lumbar wound   LYMPH NODE BIOPSY N/A  08/15/2020   Procedure: EXCISIONAL LYMPH NODE BIOPSY LEFT INGUINAL REGION, EXCISION OF LEFT THIGH NODULE, EXCISION OF LEFT ABDOMINAL WALL LESION;  Surgeon: Stark Klein, MD;  [facility];  Service: General;  Laterality: N/A;   MECKEL DIVERTICULUM EXCISION  ~ [place] LYMPH NODE BIOPSY Left 02/12/2017   WIDE LOCAL EXCISION LEFT KNEE MELANOMA, ADVANCEMENT FLAP CLOSURE, AND SENTINEL LYMPH NODE MAPPING AND  BIOPSY.   MELANOMA EXCISION WITH SENTINEL LYMPH NODE BIOPSY Left 02/12/2017   Procedure: WIDE LOCAL EXCISION LEFT KNEE MELANOMA, ADVANCEMENT FLAP CLOSURE, AND SENTINEL LYMPH NODE MAPPING AND BIOPSY.;  Surgeon: Stark Klein, MD;  Location: [facility];  Service: General;  Laterality: Left;  GENERAL AND LOCAL   ORIF SHOULDER FRACTURE  02/06/2012   Procedure: OPEN REDUCTION INTERNAL FIXATION (ORIF) SHOULDER FRACTURE;  Surgeon: Nita Sells, MD;  [facility];  Service: Orthopedics;  Laterality: Left;  ORIF of a Left Shoulder Fracture with  Iliac Crest Bone Graft aspiration    PORTACATH PLACEMENT Left 03/27/2017   Procedure: INSERTION PORT-A-CATH;  Surgeon: Stark Klein, MD;  Location: [facility];  Service: General;  Laterality: Left;   POSTERIOR LUMBAR FUSION  12-10   L4-5 diskectomy w/ fusion, cage placement and rods; [place]   POSTERIOR LUMBAR FUSION 4 LEVEL N/A 03/02/2013   Procedure: Lumbar One to Sacral One Posterior lumbar interbody fusion;  Surgeon: Floyce Stakes, MD;  Location: [facility];  Service: Neurosurgery;  Laterality: N/A;  L1 to S1 Posterior lumbar interbody fusion   REPLACEMENT UNICONDYLAR JOINT KNEE Left ~ 2003   "~ 6 months after total knee replaced"   SHOULDER ARTHROSCOPY Left ~ 2004 X 2   "@ [facility]; left bone splinter in & had to clean it out"   TOTAL ANKLE REPLACEMENT Right 2008   at [place] Bilateral 2002   TOTAL SHOULDER REPLACEMENT Left 2006   TOTAL SHOULDER REPLACEMENT Right ~ 2007    Dr. Marlou Sa  :   Current Facility-Administered Medications:    0.9 %  sodium chloride infusion, , Intravenous, Continuous, Kyle, Tyrone A, DO, Last Rate: 100 mL/hr at 12/22/20 0340, Infusion Verify at 12/22/20 0340   albuterol (PROVENTIL) (2.5 MG/3ML) 0.083% nebulizer solution 2.5 mg, 2.5 mg, Nebulization, Q4H PRN, Marylyn Ishihara, Tyrone A, DO   ALPRAZolam Duanne Moron) tablet 0.5 mg, 0.5 mg, Oral, TID PRN, Marylyn Ishihara, Tyrone A, DO, 0.5 mg at 12/21/20 2241   apixaban (ELIQUIS) tablet 5 mg, 5 mg, Oral, BID, Kyle, Tyrone A, DO, 5 mg at 12/21/20 2235   Chlorhexidine Gluconate Cloth 2 % PADS 6 each, 6 each, Topical, Daily, Eugenie Filler, MD, 6 each at 12/21/20 0914   feeding supplement (ENSURE SURGERY) liquid 237 mL, 237 mL, Oral, BID BM, Eugenie Filler, MD, 237 mL at 12/21/20 1525   FLUoxetine (PROZAC) capsule 40 mg, 40 mg, Oral, Daily, Kyle, Tyrone A, DO, 40 mg at 12/21/20 0913   fluticasone (FLONASE) 50 MCG/ACT nasal spray 2 spray, 2 spray, Each Nare, Daily PRN, Eugenie Filler, MD   gabapentin (NEURONTIN) capsule 600 mg, 600 mg, Oral, QHS, Kyle, Tyrone A, DO, 600 mg at 12/21/20 2235   HYDROmorphone (DILAUDID) tablet 2-3 mg, 2-3 mg, Oral, Q8H PRN, Eugenie Filler, MD, 3 mg at 12/21/20 2233   melatonin tablet 10 mg, 10 mg, Oral, QHS, Kyle, Tyrone A, DO, 10 mg at 12/21/20 2233   multivitamin with minerals tablet 1 tablet, 1 tablet, Oral, Daily, Eugenie Filler, MD, 1 tablet at 12/21/20 1525   polyvinyl alcohol (LIQUIFILM TEARS) 1.4 % ophthalmic solution 1 drop, 1 drop, Both Eyes, PRN, Marylyn Ishihara, Tyrone A, DO   predniSONE (DELTASONE) tablet 15 mg, 15 mg, Oral, Daily, Kyle, Tyrone A, DO, 15 mg at 12/21/20 0913   pyridOXINE (VITAMIN B-6) tablet 50 mg, 50 mg, Oral, Daily, Eugenie Filler, MD, 50 mg at 12/21/20 1211   sodium chloride flush (NS)  0.9 % injection 10-40 mL, 10-40 mL, Intracatheter, Q12H, Eugenie Filler, MD, 10 mL at 12/21/20 2236   temazepam (RESTORIL) capsule 15 mg, 15 mg, Oral, QHS PRN,  Eugenie Filler, MD   vancomycin (VANCOREADY) IVPB 1250 mg/250 mL, 1,250 mg, Intravenous, Q24H, Pham, Anh P, RPH, Last Rate: 166.7 mL/hr at 12/21/20 1209, 1,250 mg at 12/21/20 1209   vitamin B-12 (CYANOCOBALAMIN) tablet 1,000 mcg, 1,000 mcg, Oral, Daily, Eugenie Filler, MD, 1,000 mcg at 12/21/20 1406  Facility-Administered Medications Ordered in Other Encounters:    heparin lock flush 100 unit/mL, 250 Units, Intracatheter, PRN, Celso Amy, NP   sodium chloride flush (NS) 0.9 % injection 10 mL, 10 mL, Intracatheter, PRN, Celso Amy, NP:   apixaban  5 mg Oral BID   Chlorhexidine Gluconate Cloth  6 each Topical Daily   feeding supplement  237 mL Oral BID BM   FLUoxetine  40 mg Oral Daily   gabapentin  600 mg Oral QHS   melatonin  10 mg Oral QHS   multivitamin with minerals  1 tablet Oral Daily   predniSONE  15 mg Oral Daily   pyridOXINE  50 mg Oral Daily   sodium chloride flush  10-40 mL Intracatheter Q12H   vitamin B-12  1,000 mcg Oral Daily  :   Allergies  Allergen Reactions   Cefepime Hives and Shortness Of Breath   Cephaeline Hives   Morphine Swelling    A swollen stomach.   Morphine And Related Shortness Of Breath, Nausea And Vomiting, Swelling and Other (See Comments)    Agitation, tolerates dilaudid Other reaction(s): Other (See Comments) Agitation, tolerates dilaudid   Penicillins Hives, Shortness Of Breath and Rash    Has patient had a PCN reaction causing immediate rash, facial/tongue/throat swelling, SOB or lightheadedness with hypotension: Yes Has patient had a PCN reaction causing severe rash involving mucus membranes or skin necrosis: Yes Has patient had a PCN reaction that required hospitalization No Has patient had a PCN reaction occurring within the last 10 years: No If all of the above answers are "NO", then may proceed with Cephalosporin use.    Doxycycline Rash   Oxycodone-Acetaminophen Other (See Comments)    Patient doesn't remember what  type of reaction.  :   Family History  Problem Relation Age of Onset   Uterine cancer Mother        uterine vs. cervix? dx 23s; deceased 23   Macular degeneration Mother    Other Mother        ankle edema   Lung cancer Mother    Melanoma Mother        multiple spots on legs   Cancer - Lung Mother        smoker   Coronary artery disease Father    Hypertension Father    Prostate cancer Father    Colon polyps Father    Cancer - Prostate Father 58       deceased 26   Heart attack Brother    Hyperlipidemia Brother    Other Brother        Schizophrenic   Thyroid disease Sister    Hemochromatosis Sister    Rheum arthritis Sister    Coronary artery disease Maternal Aunt    Heart attack Maternal Aunt    Cancer Maternal Grandmother        unk. 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    Comment: self employed  Tobacco Use   Smoking status: Never   Smokeless tobacco: Never  Vaping Use   Vaping Use: Never used  Substance and Sexual Activity   Alcohol use: No    Alcohol/week: 0.0 standard drinks    Comment: "I do not drink anymore"   Drug use: No   Sexual activity: Yes    Partners: Female  Other Topics Concern   Not on file  Social History Narrative   [employer] [place]. Married 1972-64yr seperated/divorced. Engaged April '11. 1 son '74; step-daughter '70. 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 During '11 discovered an [employer]- $500,000+ loss. Is handling this OK - will keep  the business running.   Social Determinants of Health   Financial Resource Strain: Not on file  Food Insecurity: Not on file  Transportation Needs: Not on file  Physical Activity: Not on file  Stress: Not on file  Social Connections: Not on file  Intimate Partner Violence: Not on file  :  Review of Systems  Constitutional:  Positive for malaise/fatigue.  HENT: Negative.    Eyes: Negative.   Respiratory: Negative.    Cardiovascular: Negative.   Gastrointestinal:  Positive for nausea.  Genitourinary: Negative.   Musculoskeletal:  Positive for joint pain.  Skin: Negative.   Neurological: Negative.   Endo/Heme/Allergies:  Bruises/bleeds easily.  Psychiatric/Behavioral: Negative.      Exam: Patient Vitals for the past 24 hrs:  BP Temp Temp src Pulse Resp SpO2  12/22/20 0511 (!) 155/113 97.9 F (36.6 C) Oral (!) 103 20 90 %  12/21/20 2028 (!) 139/92 98.1 F (36.7 C) Oral 79 20 91 %  12/21/20 1329 (!) 104/59 98.6 F (37 C) Oral 83 -- 97 %   Physical Exam Vitals reviewed.  HENT:     Head: Normocephalic and atraumatic.  Eyes:     Pupils: Pupils are equal, round, and reactive to light.  Cardiovascular:     Rate and Rhythm: Normal rate and regular rhythm.     Heart sounds: Normal heart sounds.  Pulmonary:     Effort: Pulmonary effort is normal.     Breath sounds: Normal breath sounds.  Abdominal:     General: Bowel sounds are normal.     Palpations: Abdomen is soft.  Musculoskeletal:        General: No tenderness or deformity. Normal range of motion.     Cervical back: Normal range of motion.  Lymphadenopathy:     Cervical: No cervical adenopathy.  Skin:    General: Skin is warm and dry.     Findings: No erythema or rash.  Neurological:     Mental Status: He is alert and oriented to person, place, and time.  Psychiatric:        Behavior: Behavior normal.        Thought Content: Thought content normal.        Judgment: Judgment normal.    Recent Labs  12/21/20 0928 12/22/20 0406  WBC 11.6* 10.7*  HGB 10.8* 10.4*  HCT 36.2* 34.4*  PLT 126* 136*    Recent Labs    12/21/20 0928 12/22/20 0406  NA 141 138  K 3.4* 4.3  CL 104 105  CO2 32 31  GLUCOSE 232* 99  BUN 32* 26*  CREATININE 0.96 0.78  CALCIUM 7.9* 7.7*    Blood smear review: None  Pathology: None    Assessment and Plan: Mr. Stranahan is a 75 year old male.  He has recurrent melanoma.  He has had radiation therapy.  He had a groin dissection of the left side.  He had infection in the left thigh.  His problem now is his weakness.  Is hard to say if there is a active infection.  He clearly needs a lot of physical therapy.  He says that his assisted living facility, they have good physical therapy.  His edema seems to be doing better.  His labs are not all that bad.  I just feel bad that he was by himself.  We will follow along.  There really is not much that we need to do for him right now.  He does need to have the PET scan done.  Unfortunately this cannot be done as an inpatient.  Again, he needs a lot of physical therapy.  He says he can get this at his assisted living facility.  I do appreciate the wonderful care that he is getting by the hard-working staff on [address].  John Haw, MD  [place] 12:12

## 2020-12-22 NOTE — Progress Notes (Signed)
PROGRESS NOTE    [name]  NFA:[id number] DOB: 1945/11/12 DOA: 12/20/2020 PCP: Cassandria Anger, MD   Brief Narrative: 75 year old with past medical history significant for malignant melanoma, paroxysmal A. fib on Eliquis, anxiety presented after a fall.  Patient denies any LOC remember tripping over a rug.  CT head and CT spine was negative for fracture.  He was also found to be hypokalemic, worsening left groin cellulitis and wound infection.  He was a started on IV vancomycin.    Assessment & Plan:   Active Problems:   Dehydration   Cellulitis   Pressure injury of skin   Fall   Hyperbilirubinemia   Malignant melanoma ([place])   1-Left upper thigh/groin cellulitis chronic wound, right shin hematoma/SIRS;  -Patient has chronic wound after lymph node biopsy (08/2020)  on post radiation. -He presented with worsening redness and drainage. -Wound culture from 8/17  grew Citrobacter. Received Bactrim.  -Continue with IV vancomycin. ID consulted to guide antibiotics.  -I have also consulted general Sx, (Dr Barry Dienes team)/  -WBC trending down.   2-Fall/generalized weakness: Continue with PT OT  3-History of Malignant Melanoma Appreciate Dr. Marin Olp assistance. Follow-up with Dr. Marin Olp and Dr. Dineen Kid.   4-normocytic anemia/mild thrombocytopenia: Monitor on Eliquis. Platelets count increasing.  5-Dyspnea: Patient noted to have  tachypnea Scheduled nebulizer Chest x-ray order Normal saline lock fluid and IV Lasix order one-time dose Might need to  resume home torsemide.  Anxiety: Continue with Prozac Hypokalemia: Normal magnesium.  Continue with oral supplement Hyperbilirubinemia: Right upper quadrant ultrasound no biliary ductal dilation. Dehydration: Normal saline lock to avoid volume overload Tachycardia: Resume home dose metoprolol   Pressure Injury 12/20/20 Buttocks Left;Proximal Stage 3 -  Full thickness tissue loss. Subcutaneous fat may be visible but  bone, tendon or muscle are NOT exposed. (Active)  12/20/20 1800  Location: Buttocks  Location Orientation: Left;Proximal  Staging: Stage 3 -  Full thickness tissue loss. Subcutaneous fat may be visible but bone, tendon or muscle are NOT exposed.  Wound Description (Comments):   Present on Admission: Yes     Nutrition Problem: Increased nutrient needs Etiology: wound healing    Signs/Symptoms: estimated needs    Interventions: Ensure Surgery, MVI  Estimated body mass index is 22.95 kg/m as calculated from the following:   Height as of 12/07/20: 5' 6"  (1.676 m).   Weight as of 12/07/20: 64.5 kg.   DVT prophylaxis: Eliquis Code Status: DNR Family Communication: Care discussed with patient.  Disposition Plan:  Status is: Inpatient  Remains inpatient appropriate because:IV treatments appropriate due to intensity of illness or inability to take PO  Dispo: The patient is from: Home              Anticipated d/c is to: Home              Patient currently is not medically stable to d/c.   Difficult to place patient No        Consultants:  Sx ID Oncology   Procedures:    Antimicrobials:  IV vancomycin   Subjective: He report mild dyspnea. He is draining less from wound. Still have edema  Objective: Vitals:   12/21/20 0452 12/21/20 1329 12/21/20 2028 12/22/20 0511  BP: 114/65 (!) 104/59 (!) 139/92 (!) 155/113  Pulse: 68 83 79 (!) 103  Resp: 16  20 20   Temp: 97.8 F (36.6 C) 98.6 F (37 C) 98.1 F (36.7 C) 97.9 F (36.6 C)  TempSrc:  Oral Oral Oral  SpO2: 98% 97% 91% 90%    Intake/Output Summary (Last 24 hours) at 12/22/2020 0948 Last data filed at 12/22/2020 0340 Gross per 24 hour  Intake 2953.61 ml  Output 150 ml  Net 2803.61 ml   There were no vitals filed for this visit.  Examination:  General exam: Appears calm and comfortable  Respiratory system: Clear to auscultation. Respiratory effort normal. Cardiovascular system: S1 & S2 heard, RRR. No  JVD, murmurs, rubs, gallops or clicks. No pedal edema. Gastrointestinal system: Abdomen is nondistended, soft and nontender. No organomegaly or masses felt. Normal bowel sounds heard. Central nervous system: Alert and oriented. No focal neurological deficits. Extremities: Left groin with redness, Left thigh with open wound, draining bloody fluids, left calf with redness ulceration.   Data Reviewed: I have personally reviewed following labs and imaging studies  CBC: Recent Labs  Lab 12/20/20 0911 12/20/20 1939 12/21/20 0928 12/22/20 0406  WBC 16.1* 12.7* 11.6* 10.7*  NEUTROABS 14.3*  --  10.1* 9.4*  HGB 11.4* 10.7* 10.8* 10.4*  HCT 37.0* 35.5* 36.2* 34.4*  MCV 97.6 100.9* 102.0* 100.9*  PLT 131* 114* 126* 903*   Basic Metabolic Panel: Recent Labs  Lab 12/20/20 0911 12/20/20 1939 12/21/20 0928 12/22/20 0406  NA 142 144 141 138  K 2.4* 2.6* 3.4* 4.3  CL 95* 99 104 105  CO2 37* 32 32 31  GLUCOSE 99 86 232* 99  BUN 30* 38* 32* 26*  CREATININE 0.98 1.06 0.96 0.78  CALCIUM 8.4* 7.7* 7.9* 7.7*  MG 2.7*  --  2.6*  --    GFR: CrCl cannot be calculated (Unknown ideal weight.). Liver Function Tests: Recent Labs  Lab 12/20/20 0911 12/20/20 1939 12/21/20 0928  AST 26 26 40  ALT 19 19 26   ALKPHOS 47 45 44  BILITOT 2.0* 1.5* 0.9  PROT 5.6* 5.3* 5.5*  ALBUMIN 2.9* 2.9* 2.7*   No results for input(s): LIPASE, AMYLASE in the last 168 hours. No results for input(s): AMMONIA in the last 168 hours. Coagulation Profile: Recent Labs  Lab 12/20/20 1939  INR 2.0*   Cardiac Enzymes: No results for input(s): CKTOTAL, CKMB, CKMBINDEX, TROPONINI in the last 168 hours. BNP (last 3 results) No results for input(s): PROBNP in the last 8760 hours. HbA1C: No results for input(s): HGBA1C in the last 72 hours. CBG: No results for input(s): GLUCAP in the last 168 hours. Lipid Profile: No results for input(s): CHOL, HDL, LDLCALC, TRIG, CHOLHDL, LDLDIRECT in the last 72 hours. Thyroid  Function Tests: No results for input(s): TSH, T4TOTAL, FREET4, T3FREE, THYROIDAB in the last 72 hours. Anemia Panel: No results for input(s): VITAMINB12, FOLATE, FERRITIN, TIBC, IRON, RETICCTPCT in the last 72 hours. Sepsis Labs: Recent Labs  Lab 12/20/20 0912 12/20/20 1112  LATICACIDVEN 1.0 0.8    Recent Results (from the past 240 hour(s))  Culture, blood (routine x 2)     Status: None (Preliminary result)   Collection Time: 12/20/20  9:21 AM   Specimen: BLOOD  Result Value Ref Range Status   Specimen Description   Final    BLOOD PORTA CATH Performed at [facility] [address] [place], [place] [address]    Special Requests   Final    BOTTLES DRAWN AEROBIC AND ANAEROBIC Blood Culture adequate volume Performed at [facility] [address] [place], [place] [address]    Culture   Final    NO GROWTH 2 DAYS Performed at [facility] [address],  [place], [place] [address]    Report Status PENDING  Incomplete  Resp Panel by RT-PCR (Flu A&B, Covid) Nasopharyngeal Swab     Status: None   Collection Time: 12/20/20  9:22 AM   Specimen: Nasopharyngeal Swab; Nasopharyngeal(NP) swabs in vial transport medium  Result Value Ref Range Status   SARS Coronavirus 2 by RT PCR NEGATIVE NEGATIVE Final    Comment: (NOTE) SARS-CoV-2 target nucleic acids are NOT DETECTED.  The SARS-CoV-2 RNA is generally detectable in upper respiratory specimens during the acute phase of infection. The lowest concentration of SARS-CoV-2 viral copies this assay can detect is 138 copies/mL. A negative result does not preclude SARS-Cov-2 infection and should not be used as the sole basis for treatment or other patient management decisions. A negative result may occur with  improper specimen collection/handling, submission of specimen other than nasopharyngeal swab, presence of viral mutation(s) within the areas targeted by this assay, and inadequate number  of viral copies(<138 copies/mL). A negative result must be combined with clinical observations, patient history, and epidemiological information. The expected result is Negative.  Fact Sheet for Patients:  EntrepreneurPulse.com.au  Fact Sheet for Healthcare Providers:  IncredibleEmployment.be  This test is no t yet approved or cleared by the [place] FDA and  has been authorized for detection and/or diagnosis of SARS-CoV-2 by FDA under an Emergency Use Authorization (EUA). This EUA will remain  in effect (meaning this test can be used) for the duration of the COVID-19 declaration under Section 564(b)(1) of the Act, 21 U.S.C.section 360bbb-3(b)(1), unless the authorization is terminated  or revoked sooner.       Influenza A by PCR NEGATIVE NEGATIVE Final   Influenza B by PCR NEGATIVE NEGATIVE Final    Comment: (NOTE) The Xpert Xpress SARS-CoV-2/FLU/RSV plus assay is intended as an aid in the diagnosis of influenza from Nasopharyngeal swab specimens and should not be used as a sole basis for treatment. Nasal washings and aspirates are unacceptable for Xpert Xpress SARS-CoV-2/FLU/RSV testing.  Fact Sheet for Patients: EntrepreneurPulse.com.au  Fact Sheet for Healthcare Providers: IncredibleEmployment.be  This test is not yet approved or cleared by the [place] FDA and has been authorized for detection and/or diagnosis of SARS-CoV-2 by FDA under an Emergency Use Authorization (EUA). This EUA will remain in effect (meaning this test can be used) for the duration of the COVID-19 declaration under Section 564(b)(1) of the Act, 21 U.S.C. section 360bbb-3(b)(1), unless the authorization is terminated or revoked.  Performed at [facility], [place] [address] [place], [place] [address]   Culture, blood (routine x 2)     Status: None (Preliminary result)   Collection Time: 12/20/20   9:26 AM   Specimen: BLOOD  Result Value Ref Range Status   Specimen Description   Final    BLOOD LEFT ANTECUBITAL Performed at [facility] [address], [place], [place] [address]    Special Requests   Final    BOTTLES DRAWN AEROBIC ONLY Blood Culture adequate volume Performed at [facility] [address], [place], [place] [address]    Culture   Final    NO GROWTH 2 DAYS Performed at [facility] [address], [place], [place] [address]    Report Status PENDING  Incomplete         Radiology Studies: DG Chest 1 View  Result Date: 12/20/2020 CLINICAL DATA:  Found on floor. EXAM: CHEST  1 VIEW COMPARISON:  05/26/2020 FINDINGS: Chronic elevation of the  right hemidiaphragm. Left Port-A-Cath remains in place with the tip in the SVC. Mild cardiomegaly. No confluent airspace opacities or effusions. Bilateral shoulder replacements. No acute bony abnormality. IMPRESSION: Chronic elevation of the right hemidiaphragm. No acute cardiopulmonary disease. Electronically Signed   By: Rolm Baptise M.D.   On: 12/20/2020 10:15   DG Pelvis 1-2 Views  Result Date: 12/20/2020 CLINICAL DATA:  Fall EXAM: PELVIS - 1-2 VIEW COMPARISON:  CT pelvis 07/20/2020 FINDINGS: There is no acute fracture or dislocation. Femoroacetabular alignment is normal. The SI joints and symphysis pubis are intact. There is mild degenerative change of both hips. Lower lumbar spinal fusion hardware is partially imaged. IMPRESSION: No acute fracture or dislocation. Electronically Signed   By: Valetta Mole M.D.   On: 12/20/2020 10:17   DG Elbow Complete Left  Result Date: 12/20/2020 CLINICAL DATA:  Fall with left elbow pain EXAM: LEFT ELBOW - COMPLETE 3+ VIEW COMPARISON:  None. FINDINGS: There is no evidence of fracture, dislocation, or joint effusion. Generalized osteopenia. IMPRESSION: No acute finding. Electronically Signed   By: Jorje Guild M.D.   On: 12/20/2020 10:15   DG  Tibia/Fibula Right  Result Date: 12/20/2020 CLINICAL DATA:  Right leg pain after fall. EXAM: RIGHT TIBIA AND FIBULA - 2 VIEW COMPARISON:  None. FINDINGS: Status post right knee and ankle arthroplasty. No fracture or dislocation is noted. IMPRESSION: No acute abnormality seen. Electronically Signed   By: Marijo Conception M.D.   On: 12/20/2020 10:19   CT Head Wo Contrast  Result Date: 12/20/2020 CLINICAL DATA:  Head trauma, minor (Age >= 65y) Fall. EXAM: CT HEAD WITHOUT CONTRAST TECHNIQUE: Contiguous axial images were obtained from the base of the skull through the vertex without intravenous contrast. COMPARISON:  05/25/2020 FINDINGS: Brain: Chronic microvascular disease throughout the deep white matter, stable since prior study. No acute intracranial abnormality. Specifically, no hemorrhage, hydrocephalus, mass lesion, acute infarction, or significant intracranial injury. Vascular: No hyperdense vessel or unexpected calcification. Skull: No acute calvarial abnormality. Sinuses/Orbits: No acute findings Other: None IMPRESSION: Similar chronic small vessel disease. No acute intracranial abnormality. Electronically Signed   By: Rolm Baptise M.D.   On: 12/20/2020 10:29   CT Cervical Spine Wo Contrast  Result Date: 12/20/2020 CLINICAL DATA:  Neck trauma (Age >= 65y).  Fall. EXAM: CT CERVICAL SPINE WITHOUT CONTRAST TECHNIQUE: Multidetector CT imaging of the cervical spine was performed without intravenous contrast. Multiplanar CT image reconstructions were also generated. COMPARISON:  06/04/2016 FINDINGS: Alignment: Degenerative anterolisthesis of C3 on C4, C4 on C5 and C7 on T1, stable since prior study. Skull base and vertebrae: No acute fracture. No primary bone lesion or focal pathologic process. Soft tissues and spinal canal: No prevertebral fluid or swelling. No visible canal hematoma. Disc levels: Diffuse advanced degenerative disc disease and facet disease. Upper chest: No acute findings Other: None  IMPRESSION: Diffuse advanced degenerative disc disease and facet disease. Multilevel degenerative anterolisthesis, stable since prior study. No acute bony abnormality. Electronically Signed   By: Rolm Baptise M.D.   On: 12/20/2020 10:32   DG Foot 2 Views Right  Result Date: 12/20/2020 CLINICAL DATA:  Right foot pain after fall. EXAM: RIGHT FOOT - 2 VIEW COMPARISON:  January 29, 2013. FINDINGS: Status post arthroplasty of talotibial joint. Status post surgical fusion of the first and second tarsometatarsal joints. Probable arthroplasty involving the third tarsometatarsal joint. Stable chronic resorption is seen involving the distal portions of the third, fourth and fifth metatarsals. No acute fracture or dislocation is  noted. No soft tissue abnormality is noted. IMPRESSION: Chronic postsurgical and degenerative changes are noted as described above. No definite acute abnormality is seen. Electronically Signed   By: Marijo Conception M.D.   On: 12/20/2020 10:18   US Abdomen Limited RUQ (LIVER/GB)  Result Date: 12/20/2020 CLINICAL DATA:  Hyperbilirubinemia EXAM: ULTRASOUND ABDOMEN LIMITED RIGHT UPPER QUADRANT COMPARISON:  CT abdomen/pelvis 10/14/2017, abdominal ultrasound 12/09/2006 FINDINGS: Gallbladder: No gallstones or wall thickening visualized. No sonographic Murphy sign noted by sonographer. Common bile duct: Diameter: 5 mm Liver: Evaluation of the liver is somewhat limited due to body habitus and lack of an acoustic window. Within these confines, no focal lesion identified. Within normal limits in parenchymal echogenicity. Portal vein is patent on color Doppler imaging with normal direction of blood flow towards the liver. Other: None. IMPRESSION: Evaluation of the liver is somewhat limited due to body habitus and lack of an acoustic window. Within these confines, unremarkable right upper quadrant ultrasound. No biliary ductal dilatation identified. Electronically Signed   By: Valetta Mole M.D.   On:  12/20/2020 15:56        Scheduled Meds:  apixaban  5 mg Oral BID   Chlorhexidine Gluconate Cloth  6 each Topical Daily   feeding supplement  237 mL Oral BID BM   FLUoxetine  40 mg Oral Daily   gabapentin  600 mg Oral QHS   melatonin  10 mg Oral QHS   multivitamin with minerals  1 tablet Oral Daily   predniSONE  15 mg Oral Daily   pyridOXINE  50 mg Oral Daily   sodium chloride flush  10-40 mL Intracatheter Q12H   vitamin B-12  1,000 mcg Oral Daily   Continuous Infusions:  sodium chloride 100 mL/hr at 12/22/20 0340   vancomycin 1,250 mg (12/21/20 1209)     LOS: 2 days    Time spent: 35 minutes.     Elmarie Shiley, MD [facility]   If 7PM-7AM, please contact night-coverage [email or web address]  12/22/2020, 9:48 AM

## 2020-12-22 NOTE — Progress Notes (Signed)
Physical Therapy Treatment Patient Details Name: [name] MRN: [id number] DOB: [birth year] Today's Date: 12/22/2020   History of Present Illness Patient 75 year old gentleman history of malignant melanoma L thigh, paroxysmal A. fib, RA, COPD, HTN,hemochromatosis, chronic back pain, anxiety presented  12/20/20 after a fall, down several hours.  Patient denies any LOC and remembers tripping over a rug.  CT head CT C-spine done was negative.  Patient noted to be hypokalemic elevated also concern for left groin cellulitis with a leukocytosis and fever. .    PT Comments    Pt very agreeable to mobility however distance limited by PT per advice of MD d/t pt being increasingly tachypneic at rest this am, CXR pending.   Amb very short distance, with clsoe supervision with SpO2= 94-96% throughout on RA  RN notified   Recommendations for follow up therapy are one component of a multi-disciplinary discharge planning process, led by the attending physician.  Recommendations may be updated based on patient status, additional functional criteria and insurance authorization.  Follow Up Recommendations  Home health PT     Equipment Recommendations  None recommended by PT    Recommendations for Other Services       Precautions / Restrictions Precautions Precautions: Fall Precaution Comments: abrasions, pain in inner left thigh Restrictions Weight Bearing Restrictions: No     Mobility  Bed Mobility Overal bed mobility: Needs Assistance Bed Mobility: Supine to Sit     Supine to sit: HOB elevated;Min guard     General bed mobility comments: extra time,  Able to move legs over edge, min/guard to elevate trunk    Transfers Overall transfer level: Needs assistance Equipment used: Rolling walker (2 wheeled) Transfers: Sit to/from Stand Sit to Stand: Min guard;Min assist         General transfer comment: min assist to initiate wt shift then pt able to complete with incr  time  Ambulation/Gait Ambulation/Gait assistance: Min guard;Supervision Gait Distance (Feet): 20 Feet (distance limited by PT after discussion with MD) Assistive device: Rolling walker (2 wheeled) Gait Pattern/deviations: Step-to pattern;Step-through pattern;Decreased stride length Gait velocity: decr   General Gait Details: cues for posture, step length, initially unsteady however improved with distance, no overt LOB   Stairs             Wheelchair Mobility    Modified Rankin (Stroke Patients Only)       Balance     Sitting balance-Leahy Scale: Fair     Standing balance support: During functional activity;Bilateral upper extremity supported Standing balance-Leahy Scale: Poor Standing balance comment: reliant on UEs                            Cognition Arousal/Alertness: Awake/alert Behavior During Therapy: WFL for tasks assessed/performed Overall Cognitive Status: Within Functional Limits for tasks assessed                                        Exercises      General Comments        Pertinent Vitals/Pain Pain Assessment: Faces Faces Pain Scale: Hurts a little bit Pain Location: legs, back Pain Descriptors / Indicators: Discomfort;Aching Pain Intervention(s): Limited activity within patient's tolerance;Monitored during session;Repositioned    Home Living                      Prior  Function            PT Goals (current goals can now be found in the care plan section) Acute Rehab PT Goals Patient Stated Goal: improve abiities and go home at discharge PT Goal Formulation: With patient Time For Goal Achievement: 01/04/21 Potential to Achieve Goals: Good Progress towards PT goals: Progressing toward goals    Frequency    Min 3X/week      PT Plan Current plan remains appropriate    Co-evaluation              AM-PAC PT "6 Clicks" Mobility   Outcome Measure  Help needed turning from your back to  your side while in a flat bed without using bedrails?: A Little Help needed moving from lying on your back to sitting on the side of a flat bed without using bedrails?: A Little Help needed moving to and from a bed to a chair (including a wheelchair)?: A Lot Help needed standing up from a chair using your arms (e.g., wheelchair or bedside chair)?: A Little Help needed to walk in hospital room?: A Little Help needed climbing 3-5 steps with a railing? : A Lot 6 Click Score: 16    End of Session Equipment Utilized During Treatment: Gait belt Activity Tolerance: Patient tolerated treatment well Patient left: in chair;with call bell/phone within reach;with chair alarm set Nurse Communication: Mobility status PT Visit Diagnosis: Unsteadiness on feet (R26.81);History of falling (Z91.81);Difficulty in walking, not elsewhere classified (R26.2)     Time: 3361-2244 PT Time Calculation (min) (ACUTE ONLY): 17 min  Charges:  $Gait Training: 8-22 mins                     Baxter Flattery, PT  Acute Rehab Dept ([place]) [phone] Pager [phone]  12/22/2020    [facility] 12/22/2020, 10:48 AM

## 2020-12-22 NOTE — Consult Note (Signed)
[name] [birth year]  [id number].    Requesting MD: Dr. Hartley Barefoot  Chief Complaint/Reason for Consult: cellulitis of leg wound  HPI:  This is a 75 yo male with a significant PMH as detailed below who is followed by Dr. Donell Beers for prior excision of melanoma from his left thigh along with excision of deep left inguinal LNs on 08/15/20.  He has had issues seromas and healing issues since that time.  He sees Dr. Donell Beers every several weeks for wound management.  He states about a week ago he thinks the erythema around the medial wound got a bit worse than it had been.  He states he had a fever on arrival to the ED the other day up to 101.  He presented to the ED secondary to falling at home and being found down for over 24 hrs.  His left leg remains edematous which is chronically swollen as well.  We have been asked to reassess his wound today.  ROS: ROS: Please see HPI, otherwise negative except for weakness.  Family History  Problem Relation Age of Onset   Uterine cancer Mother        uterine vs. cervix? dx 34s; deceased 95   Macular degeneration Mother    Other Mother        ankle edema   Lung cancer Mother    Melanoma Mother        multiple spots on legs   Cancer - Lung Mother        smoker   Coronary artery disease Father    Hypertension Father    Prostate cancer Father    Colon polyps Father    Cancer - Prostate Father 21       deceased 47   Heart attack Brother    Hyperlipidemia Brother    Other Brother        Schizophrenic   Thyroid disease Sister    Hemochromatosis Sister    Rheum arthritis Sister    Coronary artery disease Maternal Aunt    Heart attack Maternal Aunt    Cancer Maternal Grandmother        unk. type; deceased 69s   Diabetes Neg Hx    Colon cancer Neg Hx    Esophageal cancer Neg Hx    Rectal cancer Neg Hx    Stomach cancer Neg Hx     Past Medical History:  Diagnosis Date   Alcoholism /alcohol abuse    per family   Allergic  rhinitis    Allergy    Anxiety    Bacterial infection    CAD (coronary artery disease)    minimal coronary plaque in the LAD and right coronary system. 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 7-06. Last nuclear stress 11-17-06 fixed anterior/ inferior defect, no inducible ischemia, EF 81%   Cataract    CHF (congestive heart failure) (HCC)    Chronic back pain    "all over back"   Chronic neck pain    COPD (chronic obstructive pulmonary disease) (HCC)    Depression    Diverticulosis    DVT (deep venous thrombosis) (HCC) 06/2020   Dyspnea    with exertion   Dysrhythmia 01/24/2012   past hx. A.Fib x1 episode-responded to med.   Falls frequently    "since 02/2013" (06/16/2013)   Family history of cancer    Genetic testing 03/06/2017   Multi-Cancer panel (83 genes) @ [facility] -  No pathogenic mutations detected   GERD (gastroesophageal reflux disease)    Goals of care, counseling/discussion 10/12/2020   Hemochromatosis    dx'd 14 yrs ago last ferritin Aug 11, 08 52 (22-322), Fe 136 ("I had 250 phlebotomies for that")   High cholesterol    hx   History of radiation therapy 11/06/2020   left inguinal thigh 10/10/2020-11/06/2020  Dr Antony Blackbird   Hx of colonic polyps    Hx of colonoscopy    Hypertension    Iron deficiency anemia due to chronic blood loss 12/27/2016   Malignant melanoma of knee, left (HCC)    Myocardial infarction [facility]) 2006   "related to catheterization"   Narcotic abuse (HCC)    per family   Neuropathy    fingers and toes   Osteoarthritis    Peripheral edema    Bilateral ankles   PONV (postoperative nausea and vomiting)    Pulmonary embolism (HCC) 06/2020   RA (rheumatoid arthritis) (HCC)     Past Surgical History:  Procedure Laterality Date   ABDOMINAL ADHESION SURGERY  ~ 1968   ANKLE RECONSTRUCTION Right 6-09   [facility]   APPENDECTOMY  ~ 1956   ASPIRATION OF ABSCESS Left 03/27/2017    Procedure: ASPIRATION OF SEROMA;  Surgeon: Almond Lint, MD;  Location: [facility];  Service: General;  Laterality: Left;   BONE TUMOR RESECTION  ~ 1954   "taken off my mastoid"   CARPAL TUNNEL RELEASE Right 1990's   CATARACT EXTRACTION, BILATERAL Bilateral 01-24-12   CORONARY ANGIOPLASTY WITH STENT PLACEMENT  2006   "while repairing 1st stent, a second area tore and they had to place 2nd stent " ?LAD & CX   CORONARY ANGIOPLASTY WITH STENT PLACEMENT  06/17/2014   CYST EXCISION  "several OR's"   "backX 2, back of my neck, face, inside right bicept, chest, wrist"   FOOT SURGERY Right 11-08   for removal of bone spurs-   HAMMER TOE SURGERY Right 07/2012   "broke 4 hammertoes"    HARDWARE REMOVAL  03/09/2012   Procedure: HARDWARE REMOVAL;  Surgeon: Mable Paris, MD;  Location: [facility];  Service: Orthopedics;  Laterality: Left;  Hardware Removal from Left Shoulder   HARVEST BONE GRAFT  02/06/2012   Procedure: HARVEST ILIAC BONE GRAFT;  Surgeon: Mable Paris, MD;  Location: [facility];  Service: Orthopedics;;  bone marrow aspirqation    INGUINAL HERNIA REPAIR Bilateral    JOINT REPLACEMENT     KNEE SURGERY Left ~ 2003   "6-12 months after uni knee removed synovial sack"   LEFT HEART CATHETERIZATION WITH CORONARY ANGIOGRAM N/A 06/17/2014   PCI of diffuse severe stenosis in the proximal to mid LAD using overlapping drug-eluting stents.   LUMBAR WOUND DEBRIDEMENT N/A 03/17/2013   Procedure: Incision and drainage of superficial lumbar wound;  Surgeon: Karn Cassis, MD;  Location: [facility];  Service: Neurosurgery;  Laterality: N/A;  Incision and drainage of superficial lumbar wound   LYMPH NODE BIOPSY N/A 08/15/2020   Procedure: EXCISIONAL LYMPH NODE BIOPSY LEFT INGUINAL REGION, EXCISION OF LEFT THIGH NODULE, EXCISION OF LEFT ABDOMINAL WALL LESION;  Surgeon: Almond Lint, MD;  Location: [facility];  Service: General;  Laterality: N/A;   MECKEL  DIVERTICULUM EXCISION  ~ 1956   MELANOMA EXCISION WITH SENTINEL LYMPH NODE BIOPSY Left 02/12/2017   WIDE LOCAL EXCISION LEFT KNEE MELANOMA, ADVANCEMENT FLAP CLOSURE, AND SENTINEL LYMPH NODE MAPPING AND BIOPSY.   MELANOMA EXCISION WITH SENTINEL  LYMPH NODE BIOPSY Left 02/12/2017   Procedure: WIDE LOCAL EXCISION LEFT KNEE MELANOMA, ADVANCEMENT FLAP CLOSURE, AND SENTINEL LYMPH NODE MAPPING AND BIOPSY.;  Surgeon: Almond Lint, MD;  Location[facility];  Service: General;  Laterality: Left;  GENERAL AND LOCAL   ORIF SHOULDER FRACTURE  02/06/2012   Procedure: OPEN REDUCTION INTERNAL FIXATION (ORIF) SHOULDER FRACTURE;  Surgeon: Mable Paris, MD;  Location: [facility];  Service: Orthopedics;  Laterality: Left;  ORIF of a Left Shoulder Fracture with  Iliac Crest Bone Graft aspiration    PORTACATH PLACEMENT Left 03/27/2017   Procedure: INSERTION PORT-A-CATH;  Surgeon: Almond Lint, MD;  Location: [facility];  Service: General;  Laterality: Left;   POSTERIOR LUMBAR FUSION  12-10   L4-5 diskectomy w/ fusion, cage placement and rods; [facility]   POSTERIOR LUMBAR FUSION 4 LEVEL N/A 03/02/2013   Procedure: Lumbar One to Sacral One Posterior lumbar interbody fusion;  Surgeon: Karn Cassis, MD;  [facility];  Service: Neurosurgery;  Laterality: N/A;  L1 to S1 Posterior lumbar interbody fusion   REPLACEMENT UNICONDYLAR JOINT KNEE Left ~ 2003   "~ 6 months after total knee replaced"   SHOULDER ARTHROSCOPY Left ~ 2004 X 2   "@ [facility]; left bone splinter in & had to clean it out"   TOTAL ANKLE REPLACEMENT Right 2008   at [facility]   TOTAL KNEE ARTHROPLASTY Bilateral 2002   TOTAL SHOULDER REPLACEMENT Left 2006   TOTAL SHOULDER REPLACEMENT Right ~ 2007   Dr. August Saucer    Social History:  reports that he has never smoked. He has never used smokeless tobacco. He reports that he does not drink alcohol and does not use drugs.  Allergies:  Allergies  Allergen Reactions   Cefepime Hives and  Shortness Of Breath   Cephaeline Hives   Morphine Swelling    A swollen stomach.   Morphine And Related Shortness Of Breath, Nausea And Vomiting, Swelling and Other (See Comments)    Agitation, tolerates dilaudid Other reaction(s): Other (See Comments) Agitation, tolerates dilaudid   Penicillins Hives, Shortness Of Breath and Rash    Has patient had a PCN reaction causing immediate rash, facial/tongue/throat swelling, SOB or lightheadedness with hypotension: Yes Has patient had a PCN reaction causing severe rash involving mucus membranes or skin necrosis: Yes Has patient had a PCN reaction that required hospitalization No Has patient had a PCN reaction occurring within the last 10 years: No If all of the above answers are "NO", then may proceed with Cephalosporin use.    Doxycycline Rash   Oxycodone-Acetaminophen Other (See Comments)    Patient doesn't remember what type of reaction.    Medications Prior to Admission  Medication Sig Dispense Refill   albuterol (PROVENTIL HFA;VENTOLIN HFA) 108 (90 Base) MCG/ACT inhaler Inhale 1-2 puffs into the lungs every 4 (four) hours as needed for wheezing or shortness of breath. 1 Inhaler 1   ALPRAZolam (XANAX) 0.5 MG tablet Take 1 tablet (0.5 mg total) by mouth 3 (three) times daily as needed for anxiety. for anxiety 180 tablet 1   apixaban (ELIQUIS) 5 MG TABS tablet Take 1 tablet (5 mg total) by mouth 2 (two) times daily. 180 tablet 2   Cholecalciferol (VITAMIN D3) 2000 UNITS capsule Take 1 capsule (2,000 Units total) by mouth daily. 100 capsule 3   FLUoxetine (PROZAC) 40 MG capsule Take 40 mg by mouth daily.     fluticasone (FLONASE) 50 MCG/ACT nasal spray INSTILL 2 SPRAYS IN EACH NOSTRIL EVERY  DAY (Patient taking differently: Place 2 sprays into both nostrils daily as needed for allergies.) 16 g 11   gabapentin (NEURONTIN) 300 MG capsule Take 2 capsules (600 mg total) by mouth at bedtime. 180 capsule 3   HYDROmorphone (DILAUDID) 2 MG tablet Take  1-1.5 tablets (2-3 mg total) by mouth every 8 (eight) hours as needed for severe pain. 90 tablet 0   lidocaine-prilocaine (EMLA) cream Apply 1 application topically as needed. 30 g 5   Melatonin 10 MG TABS Take 10 mg by mouth at bedtime.     metoprolol tartrate (LOPRESSOR) 25 MG tablet TAKE 1 TABLET TWO TIMES DAILY. (Patient taking differently: Take 25 mg by mouth 2 (two) times daily.) 180 tablet 3   mupirocin ointment (BACTROBAN) 2 % On leg wound w/dressing change qd or bid 60 g 1   pantoprazole (PROTONIX) 40 MG tablet Take 1 tablet (40 mg total) by mouth every evening. (Patient taking differently: Take 40 mg by mouth daily as needed (indigestion).) 90 tablet 3   polyvinyl alcohol (LIQUIFILM TEARS) 1.4 % ophthalmic solution Place 1 drop into both eyes as needed for dry eyes.     predniSONE (DELTASONE) 5 MG tablet Take 15 mg by mouth daily.     Pyridoxine HCl (B-6 PO) Take 1 tablet by mouth daily.     temazepam (RESTORIL) 15 MG capsule Take 1 capsule (15 mg total) by mouth at bedtime as needed for sleep. 30 capsule 0   torsemide (DEMADEX) 100 MG tablet Take 0.5-1 tablets (50-100 mg total) by mouth daily as needed. (Patient taking differently: Take 50-100 mg by mouth daily as needed (fluid).) 90 tablet 1   vitamin B-12 (CYANOCOBALAMIN) 1000 MCG tablet Take 1,000 mcg by mouth daily.      sulfamethoxazole-trimethoprim (BACTRIM DS) 800-160 MG tablet Take 1 tablet by mouth 2 (two) times daily. (Patient not taking: Reported on 12/20/2020) 14 tablet 0     Physical Exam: Blood pressure (!) 155/113, pulse (!) 103, temperature 97.9 F (36.6 C), temperature source Oral, resp. rate 20, SpO2 91 %. General: pleasant, frail appearing white male who is laying in bed in NAD HEENT: head is normocephalic, atraumatic.  Sclera are noninjected.  PERRL.  Ears and nose without any masses or lesions.  Mouth is pink and moist Heart: regular, rate, and rhythm.  Normal s1,s2. No obvious murmurs, gallops, or rubs noted.   Palpable radial and pedal pulses bilaterally Lungs: CTAB, no wheezes, rhonchi, or rales noted.  Respiratory effort is slightly labored at rest with O2 in place Abd: soft, NT, ND, +BS, no masses, hernias, or organomegaly, old scars noted MS: LLE with edema as well as some erythema around his Left medial thigh wound.  Proximal left thigh wound is healed.  No purulent drainage from medial wound.  Some serous/slightly cloudy fluid noted.  Erythema down his left leg, difficult to determine chronicity of some of this.  Psych: A&Ox3 with an appropriate affect.   Results for orders placed or performed during the hospital encounter of 12/20/20 (from the past 48 hour(s))  Protime-INR     Status: Abnormal   Collection Time: 12/20/20  7:39 PM  Result Value Ref Range   Prothrombin Time 22.3 (H) 11.4 - 15.2 seconds   INR 2.0 (H) 0.8 - 1.2    Comment: (NOTE) INR goal varies based on device and disease states. Performed at [facility], [address] [place], [place] [address]   Comprehensive metabolic panel     Status: Abnormal  Collection Time: 12/20/20  7:39 PM  Result Value Ref Range   Sodium 144 135 - 145 mmol/L   Potassium 2.6 (LL) 3.5 - 5.1 mmol/L    Comment: CRITICAL RESULT CALLED TO, READ BACK BY AND VERIFIED WITH: RN Grayling Congress AT 2106 12/20/20 CRUICKSHANK A    Chloride 99 98 - 111 mmol/L   CO2 32 22 - 32 mmol/L   Glucose, Bld 86 70 - 99 mg/dL    Comment: Glucose reference range applies only to samples taken after fasting for at least 8 hours.   BUN 38 (H) 8 - 23 mg/dL   Creatinine, Ser 4.09 0.61 - 1.24 mg/dL   Calcium 7.7 (L) 8.9 - 10.3 mg/dL   Total Protein 5.3 (L) 6.5 - 8.1 g/dL   Albumin 2.9 (L) 3.5 - 5.0 g/dL   AST 26 15 - 41 U/L   ALT 19 0 - 44 U/L   Alkaline Phosphatase 45 38 - 126 U/L   Total Bilirubin 1.5 (H) 0.3 - 1.2 mg/dL   GFR, Estimated >81 >19 mL/min    Comment: (NOTE) Calculated using the CKD-EPI Creatinine Equation (2021)    Anion gap 13 5 - 15     Comment: Performed at [facility], [address], [place], [place] [address]  CBC     Status: Abnormal   Collection Time: 12/20/20  7:39 PM  Result Value Ref Range   WBC 12.7 (H) 4.0 - 10.5 K/uL   RBC 3.52 (L) 4.22 - 5.81 MIL/uL   Hemoglobin 10.7 (L) 13.0 - 17.0 g/dL   HCT 95.6 (L) 21.3 - 08.6 %   MCV 100.9 (H) 80.0 - 100.0 fL   MCH 30.4 26.0 - 34.0 pg   MCHC 30.1 30.0 - 36.0 g/dL   RDW 57.8 46.9 - 62.9 %   Platelets 114 (L) 150 - 400 K/uL    Comment: SPECIMEN CHECKED FOR CLOTS Immature Platelet Fraction may be clinically indicated, consider ordering this additional test [id number] REPEATED TO VERIFY PLATELET COUNT CONFIRMED BY SMEAR    nRBC 0.0 0.0 - 0.2 %    Comment: Performed at [facility] [address] [place], [place] [address]  Comprehensive metabolic panel     Status: Abnormal   Collection Time: 12/21/20  9:28 AM  Result Value Ref Range   Sodium 141 135 - 145 mmol/L   Potassium 3.4 (L) 3.5 - 5.1 mmol/L    Comment: DELTA CHECK NOTED NO VISIBLE HEMOLYSIS    Chloride 104 98 - 111 mmol/L   CO2 32 22 - 32 mmol/L   Glucose, Bld 232 (H) 70 - 99 mg/dL    Comment: Glucose reference range applies only to samples taken after fasting for at least 8 hours.   BUN 32 (H) 8 - 23 mg/dL   Creatinine, Ser 7.25 0.61 - 1.24 mg/dL   Calcium 7.9 (L) 8.9 - 10.3 mg/dL   Total Protein 5.5 (L) 6.5 - 8.1 g/dL   Albumin 2.7 (L) 3.5 - 5.0 g/dL   AST 40 15 - 41 U/L   ALT 26 0 - 44 U/L   Alkaline Phosphatase 44 38 - 126 U/L   Total Bilirubin 0.9 0.3 - 1.2 mg/dL   GFR, Estimated >36 >64 mL/min    Comment: (NOTE) Calculated using the CKD-EPI Creatinine Equation (2021)    Anion gap 5 5 - 15    Comment: Performed at [facility], [address], [place], [place] [address]  Magnesium  Status: Abnormal   Collection Time: 12/21/20  9:28 AM  Result Value Ref Range   Magnesium 2.6 (H) 1.7 - 2.4 mg/dL    Comment: Performed at  [facility], [address], [place], [place] [address]  CBC with Differential/Platelet     Status: Abnormal   Collection Time: 12/21/20  9:28 AM  Result Value Ref Range   WBC 11.6 (H) 4.0 - 10.5 K/uL   RBC 3.55 (L) 4.22 - 5.81 MIL/uL   Hemoglobin 10.8 (L) 13.0 - 17.0 g/dL   HCT 32.4 (L) 40.1 - 02.7 %   MCV 102.0 (H) 80.0 - 100.0 fL   MCH 30.4 26.0 - 34.0 pg   MCHC 29.8 (L) 30.0 - 36.0 g/dL   RDW 25.3 66.4 - 40.3 %   Platelets 126 (L) 150 - 400 K/uL    Comment: Immature Platelet Fraction may be clinically indicated, consider ordering this additional test [id number]    nRBC 0.0 0.0 - 0.2 %   Neutrophils Relative % 88 %   Neutro Abs 10.1 (H) 1.7 - 7.7 K/uL   Lymphocytes Relative 8 %   Lymphs Abs 0.9 0.7 - 4.0 K/uL   Monocytes Relative 4 %   Monocytes Absolute 0.5 0.1 - 1.0 K/uL   Eosinophils Relative 0 %   Eosinophils Absolute 0.0 0.0 - 0.5 K/uL   Basophils Relative 0 %   Basophils Absolute 0.0 0.0 - 0.1 K/uL   Immature Granulocytes 0 %   Abs Immature Granulocytes 0.05 0.00 - 0.07 K/uL    Comment: Performed at [facility], [address], [place], [place] [address]  Basic metabolic panel     Status: Abnormal   Collection Time: 12/22/20  4:06 AM  Result Value Ref Range   Sodium 138 135 - 145 mmol/L    Comment: REPEATED TO VERIFY   Potassium 4.3 3.5 - 5.1 mmol/L    Comment: REPEATED TO VERIFY DELTA CHECK NOTED    Chloride 105 98 - 111 mmol/L    Comment: REPEATED TO VERIFY   CO2 31 22 - 32 mmol/L    Comment: REPEATED TO VERIFY   Glucose, Bld 99 70 - 99 mg/dL    Comment: Glucose reference range applies only to samples taken after fasting for at least 8 hours.   BUN 26 (H) 8 - 23 mg/dL   Creatinine, Ser 4.33 0.61 - 1.24 mg/dL   Calcium 7.7 (L) 8.9 - 10.3 mg/dL   GFR, Estimated >29 >51 mL/min    Comment: (NOTE) Calculated using the CKD-EPI Creatinine Equation (2021)    Anion gap 2 (L) 5 - 15    Comment: Performed at [facility], [address] [place], [place] [address]  CBC with Differential/Platelet     Status: Abnormal   Collection Time: 12/22/20  4:06 AM  Result Value Ref Range   WBC 10.7 (H) 4.0 - 10.5 K/uL   RBC 3.41 (L) 4.22 - 5.81 MIL/uL   Hemoglobin 10.4 (L) 13.0 - 17.0 g/dL   HCT 60.6 (L) 30.1 - 60.1 %   MCV 100.9 (H) 80.0 - 100.0 fL   MCH 30.5 26.0 - 34.0 pg   MCHC 30.2 30.0 - 36.0 g/dL   RDW 09.3 23.5 - 57.3 %   Platelets 136 (L) 150 - 400 K/uL   nRBC 0.0 0.0 - 0.2 %   Neutrophils Relative % 88 %   Neutro Abs 9.4 (H) 1.7 - 7.7 K/uL   Lymphocytes Relative 6 %  Lymphs Abs 0.7 0.7 - 4.0 K/uL   Monocytes Relative 6 %   Monocytes Absolute 0.6 0.1 - 1.0 K/uL   Eosinophils Relative 0 %   Eosinophils Absolute 0.0 0.0 - 0.5 K/uL   Basophils Relative 0 %   Basophils Absolute 0.0 0.0 - 0.1 K/uL   Immature Granulocytes 0 %   Abs Immature Granulocytes 0.04 0.00 - 0.07 K/uL    Comment: Performed at [facility], [address] [place], [place] [address]   *Note: Due to a large number of results and/or encounters for the requested time period, some results have not been displayed. A complete set of results can be found in Results Review.   DG CHEST PORT 1 VIEW  Result Date: 12/22/2020 CLINICAL DATA:  Tachypnea EXAM: PORTABLE CHEST 1 VIEW COMPARISON:  12/20/2020 FINDINGS: Bilateral shoulder arthroplasty. Left Port-A-Cath tip at low SVC. Midline trachea. Cardiomegaly accentuated by AP portable technique. Atherosclerosis in the transverse aorta. Possible small left pleural effusion. Skin fold over the right hemithorax. No pneumothorax. Left greater than right base airspace disease. Marked right hemidiaphragm elevation. IMPRESSION: Possible small left pleural effusion. Otherwise, similar appearance of bibasilar Airspace disease, likely atelectasis. Cardiomegaly with low lung volumes.  No overt congestive failure. Aortic Atherosclerosis (ICD10-I70.0). Electronically Signed    By: Jeronimo Greaves M.D.   On: 12/22/2020 10:55   US Abdomen Limited RUQ (LIVER/GB)  Result Date: 12/20/2020 CLINICAL DATA:  Hyperbilirubinemia EXAM: ULTRASOUND ABDOMEN LIMITED RIGHT UPPER QUADRANT COMPARISON:  CT abdomen/pelvis 10/14/2017, abdominal ultrasound 12/09/2006 FINDINGS: Gallbladder: No gallstones or wall thickening visualized. No sonographic Murphy sign noted by sonographer. Common bile duct: Diameter: 5 mm Liver: Evaluation of the liver is somewhat limited due to body habitus and lack of an acoustic window. Within these confines, no focal lesion identified. Within normal limits in parenchymal echogenicity. Portal vein is patent on color Doppler imaging with normal direction of blood flow towards the liver. Other: None. IMPRESSION: Evaluation of the liver is somewhat limited due to body habitus and lack of an acoustic window. Within these confines, unremarkable right upper quadrant ultrasound. No biliary ductal dilatation identified. Electronically Signed   By: Lesia Hausen M.D.   On: 12/20/2020 15:56      Assessment/Plan S/p excision of multiple melanoma lesions and LNs of left thigh, Dr. Donell Beers 08/15/20 The patient has had some chronic issues with seromas according to the patient.  He has been seeing Dr. Donell Beers frequently for wound healing issues.  See reviewed the picture I took and feels this looks relatively stable from the last time she saw him.  She did recommend about 7 days total of abx therapy.  He is on vanc here and can transition to doxy to complete therapy at discharge.  We will [place] this area to make sure there is no further infection beneath this that we can't see.  We will also reduplex his leg to rule out DVT.  This leg is chronically edematous secondary to lymphedema after LN excisions.  Will follow up after duplex and [place] completed.  Otherwise abx therapy and he should have follow up with Dr. Donell Beers already arranged within the next 1-2 weeks.   Letha Cape, [facility] 12/22/2020, 11:42 AM Please see Amion for pager number during day hours 7:00am-4:30pm or 7:00am -11:30am on weekends

## 2020-12-23 DIAGNOSIS — L03116 Cellulitis of left lower limb: Secondary | ICD-10-CM | POA: Diagnosis not present

## 2020-12-23 DIAGNOSIS — W19XXXA Unspecified fall, initial encounter: Secondary | ICD-10-CM | POA: Diagnosis not present

## 2020-12-23 DIAGNOSIS — E876 Hypokalemia: Secondary | ICD-10-CM | POA: Diagnosis not present

## 2020-12-23 LAB — BASIC METABOLIC PANEL
Anion gap: 4 — ABNORMAL LOW (ref 5–15)
BUN: 19 mg/dL (ref 8–23)
CO2: 30 mmol/L (ref 22–32)
Calcium: 7.8 mg/dL — ABNORMAL LOW (ref 8.9–10.3)
Chloride: 102 mmol/L (ref 98–111)
Creatinine, Ser: 0.68 mg/dL (ref 0.61–1.24)
GFR, Estimated: >60 mL/min (ref >60)
Glucose, Bld: 84 mg/dL (ref 70–99)
Potassium: 4.3 mmol/L (ref 3.5–5.1)
Sodium: 136 mmol/L (ref 135–145)

## 2020-12-23 LAB — CBC
HCT: 32.9 % — ABNORMAL LOW (ref 39.0–52.0)
Hemoglobin: 10 g/dL — ABNORMAL LOW (ref 13.0–17.0)
MCH: 30.3 pg (ref 26.0–34.0)
MCHC: 30.4 g/dL (ref 30.0–36.0)
MCV: 99.7 fL (ref 80.0–100.0)
Platelets: 143 10*3/uL — ABNORMAL LOW (ref 150–400)
RBC: 3.3 MIL/uL — ABNORMAL LOW (ref 4.22–5.81)
RDW: 13.2 % (ref 11.5–15.5)
WBC: 7.3 10*3/uL (ref 4.0–10.5)
nRBC: 0 % (ref 0.0–0.2)

## 2020-12-23 MED ORDER — FUROSEMIDE 40 MG PO TABS
40.0000 mg | ORAL_TABLET | Freq: Every day | ORAL | Status: DC
  Administered 2020-12-23 – 2020-12-24 (×2): 40 mg via ORAL
  Filled 2020-12-23 (×2): qty 1

## 2020-12-23 MED ORDER — PANTOPRAZOLE SODIUM 40 MG PO TBEC
40.0000 mg | DELAYED_RELEASE_TABLET | Freq: Once | ORAL | Status: AC
  Administered 2020-12-23: 40 mg via ORAL
  Filled 2020-12-23: qty 1

## 2020-12-23 NOTE — Progress Notes (Signed)
PROGRESS NOTE    [name]  EXN:[id number] DOB: June 16, 1945 DOA: 12/20/2020 PCP: Cassandria Anger, MD   Chief Complaint  Patient presents with   Fall   Back Pain   Leg Pain    Brief Narrative:  Patient 75 year old gentleman history of malignant melanoma, paroxysmal A. fib on Eliquis, anxiety presented after a fall.  Patient denies any LOC and remembers tripping over a rug.  CT head CT C-spine done was negative.  Patient noted to be hypokalemic elevated also concern for left groin cellulitis with a leukocytosis and fever.  Patient placed on vancomycin.   Assessment & Plan:   Principal Problem:   Cellulitis Active Problems:   Dehydration   Pressure injury of skin   Fall   Hyperbilirubinemia   Malignant melanoma ([place])   Open thigh wound   Citrobacter infection   Pseudomonas infection  #1 left upper thigh/groin cellulitis surrounding chronic wound/right shin hematoma/SIRS -Patient noted to have a chronic wound after lymph node biopsy 08/2020 and postradiation. -Patient admitted with concerns for SIRS with a fever, leukocytosis, concern for left groin/left upper thigh cellulitis. -Erythema improving. -Wound culture noted to have grown Citrobacter on 11/1718 22 and received Bactrim in outpatient setting. -Blood cultures with no growth to date. -Leukocytosis trending down. -Continue empiric IV vancomycin. -Continue home oral Dilaudid pain regimen. -Patient seen in consultation by ID, Dr.Van Dam who is recommending continuation of vancomycin at this time with follow-up on ultrasound of the leg and assess. -Lower extremity ultrasound negative for DVT however shows a complex heterogeneous/hypoechoic structure in the area of the groin 3.2 x 2.5 x 1.7 cm.   -General surgery consulted and recommending continuation of IV antibiotics as recommended per ID with no further debridement or drainage of the wound at this time. -W OC RN assessed patient and recommendations made.  2.   Fall/generalized weakness -PT/OT.  3.  History of malignant melanoma -Currently on radiation/chemo with Dr. Marin Olp, Dr. Dineen Kid. -Dr. Marin Olp with oncology notified of admission via epic and following. -Outpatient follow-up with oncology  4.  Normocytic anemia/mild thrombocytopenia -No evidence of bleeding noted. -Monitor on Eliquis.  5.  Anxiety -Prozac  6.  Hypokalemia Magnesium at 2.7. -Potassium on admission was 2.4 and currently at 4.3 today.   -Monitor while on diuretics.   7.  Hyperbilirubinemia -Noted to be elevated since August. -Bilirubin trended down and hyperbilirubinemia resolved -Right upper quadrant ultrasound somewhat limited due to body habitus and lack of acoustic window otherwise unremarkable.  No biliary ductal dilatation identified. -Outpatient follow-up.  8.  Dehydration Was on IV fluids which have been saline locked. -Follow.  9.  History of PE/DVT -Continue home regimen Eliquis.  10.  Dyspnea -Patient noted to have some dyspnea and noted to be tachypneic. -Patient placed on scheduled nebs. -Patient IV fluids with saline locked and patient given a dose of IV Lasix with a urine output of 1.7 L over the past 24 hours. -Patient on torsemide as needed prior to admission. -Place on oral Lasix 40 mg daily.  11.  Pressure injury, POA Pressure Injury 12/20/20 Buttocks Left;Proximal Stage 3 -  Full thickness tissue loss. Subcutaneous fat may be visible but bone, tendon or muscle are NOT exposed. (Active)  12/20/20 1800  Location: Buttocks  Location Orientation: Left;Proximal  Staging: Stage 3 -  Full thickness tissue loss. Subcutaneous fat may be visible but bone, tendon or muscle are NOT exposed.  Wound Description (Comments):   Present on Admission: Yes  DVT prophylaxis: Eliquis Code Status: DNR Family Communication: Updated patient.  No family at bedside. Disposition:   Status is: Inpatient  Remains inpatient appropriate  because:Inpatient level of care appropriate due to severity of illness  Dispo: The patient is from: Home              Anticipated d/c is to: Home              Patient currently is not medically stable to d/c.   Difficult to place patient No       Consultants:  Oncology: Dr. Marin Olp 12/22/2020 ID: Dr. Tommy Medal 12/22/2020 General surgery: Dr. Redmond Pulling 12/22/2020  Procedures:  CT head CT C-spine 12/20/2020 Chest x-ray 12/20/2020 Plain films of the right tib-fib 12/20/2020 Plain films of the pelvis 12/20/2020 Pain.  The right foot 12/20/2020 Abdominal ultrasound 12/20/2020 Lower extremity ultrasound/Dopplers 12/22/2020  Antimicrobials:  IV vancomycin 12/21/2020>>>>   Subjective: Laying in bed.  Patient states not feeling too well today.  Denies any chest pain.  No shortness of breath.   Objective: Vitals:   12/22/20 2159 12/23/20 0500 12/23/20 0749 12/23/20 1257  BP:  133/81  (!) 154/88  Pulse:  65  63  Resp:  16  (!) 22  Temp:  98.4 F (36.9 C)  98.8 F (37.1 C)  TempSrc:  Oral    SpO2: 97% 90% 91% 94%    Intake/Output Summary (Last 24 hours) at 12/23/2020 1343 Last data filed at 12/23/2020 1144 Gross per 24 hour  Intake --  Output 1950 ml  Net -1950 ml    There were no vitals filed for this visit.  Examination:  General exam: NAD Respiratory system: Lungs clear to auscultation bilaterally.  No wheezes, no crackles, no rhonchi.  Normal respiratory effort.   Cardiovascular system: RRR no murmurs rubs or gallops.  No JVD.  No lower extremity edema.   Gastrointestinal system: Abdomen is soft, nontender, nondistended, positive bowel sounds.   Central nervous system: Alert and oriented. No focal neurological deficits. Extremities: Symmetric 5 x 5 power. Skin: Left upper thigh decreasing surrounding erythema around wound. Right shin with dark purple hematoma with intact skin no drainage noted, no surrounding erythema.  Psychiatry: Judgement and insight appear normal. Mood &  affect appropriate.          Data Reviewed: I have personally reviewed following labs and imaging studies  CBC: Recent Labs  Lab 12/20/20 0911 12/20/20 1939 12/21/20 0928 12/22/20 0406 12/23/20 0338  WBC 16.1* 12.7* 11.6* 10.7* 7.3  NEUTROABS 14.3*  --  10.1* 9.4*  --   HGB 11.4* 10.7* 10.8* 10.4* 10.0*  HCT 37.0* 35.5* 36.2* 34.4* 32.9*  MCV 97.6 100.9* 102.0* 100.9* 99.7  PLT 131* 114* 126* 136* 143*     Basic Metabolic Panel: Recent Labs  Lab 12/20/20 0911 12/20/20 1939 12/21/20 0928 12/22/20 0406 12/23/20 0338  NA 142 144 141 138 136  K 2.4* 2.6* 3.4* 4.3 4.3  CL 95* 99 104 105 102  CO2 37* 32 32 31 30  GLUCOSE 99 86 232* 99 84  BUN 30* 38* 32* 26* 19  CREATININE 0.98 1.06 0.96 0.78 0.68  CALCIUM 8.4* 7.7* 7.9* 7.7* 7.8*  MG 2.7*  --  2.6*  --   --      GFR: CrCl cannot be calculated (Unknown ideal weight.).  Liver Function Tests: Recent Labs  Lab 12/20/20 0911 12/20/20 1939 12/21/20 0928  AST 26 26 40  ALT 19 19 26   ALKPHOS 47 45 44  BILITOT 2.0* 1.5* 0.9  PROT 5.6* 5.3* 5.5*  ALBUMIN 2.9* 2.9* 2.7*     CBG: No results for input(s): GLUCAP in the last 168 hours.   Recent Results (from the past 240 hour(s))  Culture, blood (routine x 2)     Status: None (Preliminary result)   Collection Time: 12/20/20  9:21 AM   Specimen: BLOOD  Result Value Ref Range Status   Specimen Description   Final    BLOOD PORTA CATH Performed at [facility] [address], [place], [place] [address]    Special Requests   Final    BOTTLES DRAWN AEROBIC AND ANAEROBIC Blood Culture adequate volume Performed at [facility] [address], [place], [place] [address]    Culture   Final    NO GROWTH 3 DAYS Performed at [facility], [place] [address], [place], [place] [address]    Report Status PENDING  Incomplete  Resp Panel by RT-PCR (Flu A&B, Covid) Nasopharyngeal Swab     Status: None   Collection Time:  12/20/20  9:22 AM   Specimen: Nasopharyngeal Swab; Nasopharyngeal(NP) swabs in vial transport medium  Result Value Ref Range Status   SARS Coronavirus 2 by RT PCR NEGATIVE NEGATIVE Final    Comment: (NOTE) SARS-CoV-2 target nucleic acids are NOT DETECTED.  The SARS-CoV-2 RNA is generally detectable in upper respiratory specimens during the acute phase of infection. The lowest concentration of SARS-CoV-2 viral copies this assay can detect is 138 copies/mL. A negative result does not preclude SARS-Cov-2 infection and should not be used as the sole basis for treatment or other patient management decisions. A negative result may occur with  improper specimen collection/handling, submission of specimen other than nasopharyngeal swab, presence of viral mutation(s) within the areas targeted by this assay, and inadequate number of viral copies(<138 copies/mL). A negative result must be combined with clinical observations, patient history, and epidemiological information. The expected result is Negative.  Fact Sheet for Patients:  EntrepreneurPulse.com.au  Fact Sheet for Healthcare Providers:  IncredibleEmployment.be  This test is no t yet approved or cleared by the [place] FDA and  has been authorized for detection and/or diagnosis of SARS-CoV-2 by FDA under an Emergency Use Authorization (EUA). This EUA will remain  in effect (meaning this test can be used) for the duration of the COVID-19 declaration under Section 564(b)(1) of the Act, 21 U.S.C.section 360bbb-3(b)(1), unless the authorization is terminated  or revoked sooner.       Influenza A by PCR NEGATIVE NEGATIVE Final   Influenza B by PCR NEGATIVE NEGATIVE Final    Comment: (NOTE) The Xpert Xpress SARS-CoV-2/FLU/RSV plus assay is intended as an aid in the diagnosis of influenza from Nasopharyngeal swab specimens and should not be used as a sole basis for treatment. Nasal washings  and aspirates are unacceptable for Xpert Xpress SARS-CoV-2/FLU/RSV testing.  Fact Sheet for Patients: EntrepreneurPulse.com.au  Fact Sheet for Healthcare Providers: IncredibleEmployment.be  This test is not yet approved or cleared by the [place] FDA and has been authorized for detection and/or diagnosis of SARS-CoV-2 by FDA under an Emergency Use Authorization (EUA). This EUA will remain in effect (meaning this test can be used) for the duration of the COVID-19 declaration under Section 564(b)(1) of the Act, 21 U.S.C. section 360bbb-3(b)(1), unless the authorization is terminated or revoked.  Performed at [facility], [place] [address], [place], [place] [address]   Culture, blood (routine x 2)  Status: None (Preliminary result)   Collection Time: 12/20/20  9:26 AM   Specimen: BLOOD  Result Value Ref Range Status   Specimen Description   Final    BLOOD LEFT ANTECUBITAL Performed at [facility] [address] [place], [place] [address]    Special Requests   Final    BOTTLES DRAWN AEROBIC ONLY Blood Culture adequate volume Performed at [facility] [address] [place], [place] [address]    Culture   Final    NO GROWTH 3 DAYS Performed at [facility], [place] [address], [place], [place] [address]    Report Status PENDING  Incomplete          Radiology Studies: DG CHEST PORT 1 VIEW  Result Date: 12/22/2020 CLINICAL DATA:  Tachypnea EXAM: PORTABLE CHEST 1 VIEW COMPARISON:  12/20/2020 FINDINGS: Bilateral shoulder arthroplasty. Left Port-A-Cath tip at low SVC. Midline trachea. Cardiomegaly accentuated by AP portable technique. Atherosclerosis in the transverse aorta. Possible small left pleural effusion. Skin fold over the right hemithorax. No pneumothorax. Left greater than right base airspace disease. Marked right hemidiaphragm elevation. IMPRESSION: Possible small  left pleural effusion. Otherwise, similar appearance of bibasilar Airspace disease, likely atelectasis. Cardiomegaly with low lung volumes.  No overt congestive failure. Aortic Atherosclerosis (ICD10-I70.0). Electronically Signed   By: Abigail Miyamoto M.D.   On: 12/22/2020 10:55   [place] LT LOWER EXTREM LTD SOFT TISSUE NON VASCULAR  Result Date: 12/22/2020 CLINICAL DATA:  Open thigh wound.  Rule out abscess. EXAM: ULTRASOUND LEFT LOWER EXTREMITY LIMITED TECHNIQUE: Ultrasound examination of the lower extremity soft tissues was performed in the area of clinical concern. COMPARISON:  None. FINDINGS: In the area of clinical concern within the left thigh, a superficial avascular heterogeneously hypoechoic lesion measures 2.3 x 1.4 x 2.1 cm. No vascularity within. Positioned just superficial to a large vessel, likely the femoral artery. No communication identified. IMPRESSION: Complex collection within the superficial thigh, suggesting phlegmon, chronic hematuria or early abscess. Please note that this is immediately superficial to a large vessel, presumably the femoral artery. Electronically Signed   By: Abigail Miyamoto M.D.   On: 12/22/2020 15:13   VAS [place] LOWER EXTREMITY VENOUS (DVT)  Result Date: 12/22/2020  Lower Venous DVT Study Patient Name:  OSWIN GRIFFITH  Date of Exam:   12/22/2020 Medical Rec #: [id number]           Accession #:    [id number] Date of Birth: 1946/04/08           Patient Gender: M Patient Age:   82 years Exam Location:  [facility] Procedure:      VAS [place] LOWER EXTREMITY VENOUS (DVT) Referring Phys: Claiborne Billings OSBORNE --------------------------------------------------------------------------------  Indications: Pain, Swelling, and Erythema. Other Indications: LLE melanoma and lymph node excision in 08/2020 and per                    patient has never healed. Risk Factors: Chemotherapy HX of PE 05/2019 Cancer HX of melanoma DVT HX 05/2019. Anticoagulation: Eliquis - for AFIB. Limitations: Body  habitus and poor ultrasound/tissue interface. Comparison Study: Previous exam 08/03/2020 - negative for DVT. Performing Technologist: Rogelia Rohrer RVT, RDMS  Examination Guidelines: A complete evaluation includes B-mode imaging, spectral Doppler, color Doppler, and power Doppler as needed of all accessible portions of each vessel. Bilateral testing is considered an integral part of a complete examination. Limited examinations for reoccurring indications may be performed as noted. The reflux portion  of the exam is performed with the patient in reverse Trendelenburg.  +-----+---------------+---------+-----------+----------+--------------+ RIGHTCompressibilityPhasicitySpontaneityPropertiesThrombus Aging +-----+---------------+---------+-----------+----------+--------------+ CFV  Full           Yes      Yes                                 +-----+---------------+---------+-----------+----------+--------------+   +---------+---------------+---------+-----------+----------+-------------------+ LEFT     CompressibilityPhasicitySpontaneityPropertiesThrombus Aging      +---------+---------------+---------+-----------+----------+-------------------+ CFV      Full           Yes      Yes                                      +---------+---------------+---------+-----------+----------+-------------------+ SFJ      Full                                                             +---------+---------------+---------+-----------+----------+-------------------+ FV Prox  Full           Yes      Yes                                      +---------+---------------+---------+-----------+----------+-------------------+ FV Mid   Full           Yes      Yes                                      +---------+---------------+---------+-----------+----------+-------------------+ FV DistalFull           Yes      Yes                                       +---------+---------------+---------+-----------+----------+-------------------+ PFV      Full                                                             +---------+---------------+---------+-----------+----------+-------------------+ POP      Full           Yes      Yes                                      +---------+---------------+---------+-----------+----------+-------------------+ PTV                                                   Not well visualized +---------+---------------+---------+-----------+----------+-------------------+ PERO  Not well visualized +---------+---------------+---------+-----------+----------+-------------------+ Poor visualization of PTV and PeroV due to excesive edema.   Summary: RIGHT: - No evidence of common femoral vein obstruction.  LEFT: - Portions of this examination were limited- see technologist comments above. - There is no evidence of deep vein thrombosis in the lower extremity. - There is no evidence of superficial venous thrombosis.  Compex heterogenous/ hypoechoic structure seen in area of groin (3.2 x 2.5 x 1.7 cm).  *See table(s) above for measurements and observations.    Preliminary         Scheduled Meds:  apixaban  5 mg Oral BID   Chlorhexidine Gluconate Cloth  6 each Topical Daily   feeding supplement  237 mL Oral BID BM   FLUoxetine  40 mg Oral Daily   furosemide  40 mg Oral Daily   gabapentin  600 mg Oral QHS   melatonin  10 mg Oral QHS   metoprolol tartrate  25 mg Oral BID   multivitamin with minerals  1 tablet Oral Daily   predniSONE  15 mg Oral Daily   pyridOXINE  50 mg Oral Daily   sodium chloride flush  10-40 mL Intracatheter Q12H   vitamin B-12  1,000 mcg Oral Daily   Continuous Infusions:  vancomycin 1,250 mg (12/23/20 1337)     LOS: 3 days    Time spent: 35 minutes    Irine Seal, MD [facility]   To contact the attending provider  between 7A-7P or the covering provider during after hours 7P-7A, please log into the web site [email or web address] and access using universal [place] password for that web site. If you do not have the password, please call the hospital operator.  12/23/2020, 1:43 PM

## 2020-12-23 NOTE — Progress Notes (Signed)
Pharmacy Antibiotic Note  [name] is a 75 y.o. male with LLE malignant melanoma with left thigh wound resulting from excision of melanoma and left leg lymphedema who presented to the ED on 12/20/2020 s/p fall at ALF. He was started on vancomycin on admission for left thing infection/wound.  Today, 12/23/2020: - day #4 abx - afeb, wbc down 7.3 - scr 0.68    Plan: - continue with vancomycin 1266m IV q24h - f/u with ID recommendations  ____________________________________ Temp (24hrs), Avg:98.3 F (36.8 C), Min:98.2 F (36.8 C), Max:98.4 F (36.9 C)  Recent Labs  Lab 12/20/20 0911 12/20/20 0912 12/20/20 1112 12/20/20 1939 12/21/20 0928 12/22/20 0406 12/23/20 0338  WBC 16.1*  --   --  12.7* 11.6* 10.7* 7.3  CREATININE 0.98  --   --  1.06 0.96 0.78 0.68  LATICACIDVEN  --  1.0 0.8  --   --   --   --      CrCl cannot be calculated (Unknown ideal weight.).    Allergies  Allergen Reactions   Cefepime Hives and Shortness Of Breath   Cephaeline Hives   Morphine Swelling    A swollen stomach.   Morphine And Related Shortness Of Breath, Nausea And Vomiting, Swelling and Other (See Comments)    Agitation, tolerates dilaudid Other reaction(s): Other (See Comments) Agitation, tolerates dilaudid   Penicillins Hives, Shortness Of Breath and Rash    Has patient had a PCN reaction causing immediate rash, facial/tongue/throat swelling, SOB or lightheadedness with hypotension: Yes Has patient had a PCN reaction causing severe rash involving mucus membranes or skin necrosis: Yes Has patient had a PCN reaction that required hospitalization No Has patient had a PCN reaction occurring within the last 10 years: No If all of the above answers are "NO", then may proceed with Cephalosporin use.    Doxycycline Rash   Oxycodone-Acetaminophen Other (See Comments)    Patient doesn't remember what type of reaction.    9/14 vanc>>   8/17 Wound L groin: Citrobacter koseri, moderate  growth, S to all abx tested 9/14 BCx x1 antecubital: ngtd 9/14 BCx x1 PAC: ngtd  Thank you for allowing pharmacy to be a part of this patient's care.  PLynelle Doctor9/17/2022 11:05 AM

## 2020-12-23 NOTE — Progress Notes (Signed)
Cellulitis  Subjective: 75 yo male who is followed by Dr. Barry Dienes for prior excision of melanoma from his left thigh along with excision of deep left inguinal LNs on 08/15/20.  He has had issues seromas and healing issues since that time.  He sees Dr. Barry Dienes every several weeks for wound management.  About a week ago the erythema around the medial wound got a bit worse and he developed a fever. He presented to the ED secondary to falling at home and being found down for over 24 hrs.    Pt states his symptoms are about the same today  Objective: Vital signs in last 24 hours: Temp:  [98.2 F (36.8 C)-98.4 F (36.9 C)] 98.4 F (36.9 C) (09/17 0500) Pulse Rate:  [65-94] 65 (09/17 0500) Resp:  [16] 16 (09/17 0500) BP: (133-142)/(81-109) 133/81 (09/17 0500) SpO2:  [90 %-97 %] 91 % (09/17 0749) Last BM Date: 12/22/20  Intake/Output from previous day: 09/16 0701 - 09/17 0700 In: 150 [P.O.:150] Out: 1700 [Urine:1700] Intake/Output this shift: No intake/output data recorded.  General appearance: alert and cooperative Incision/Wound: erythema around wound appears to be recessing below the marked boundaries   Lab Results:  Results for orders placed or performed during the hospital encounter of 12/20/20 (from the past 24 hour(s))  CBC     Status: Abnormal   Collection Time: 12/23/20  3:38 AM  Result Value Ref Range   WBC 7.3 4.0 - 10.5 K/uL   RBC 3.30 (L) 4.22 - 5.81 MIL/uL   Hemoglobin 10.0 (L) 13.0 - 17.0 g/dL   HCT 32.9 (L) 39.0 - 52.0 %   MCV 99.7 80.0 - 100.0 fL   MCH 30.3 26.0 - 34.0 pg   MCHC 30.4 30.0 - 36.0 g/dL   RDW 13.2 11.5 - 15.5 %   Platelets 143 (L) 150 - 400 K/uL   nRBC 0.0 0.0 - 0.2 %  Basic metabolic panel     Status: Abnormal   Collection Time: 12/23/20  3:38 AM  Result Value Ref Range   Sodium 136 135 - 145 mmol/L   Potassium 4.3 3.5 - 5.1 mmol/L   Chloride 102 98 - 111 mmol/L   CO2 30 22 - 32 mmol/L   Glucose, Bld 84 70 - 99 mg/dL   BUN 19 8 - 23 mg/dL    Creatinine, Ser 0.68 0.61 - 1.24 mg/dL   Calcium 7.8 (L) 8.9 - 10.3 mg/dL   GFR, Estimated >60 >60 mL/min   Anion gap 4 (L) 5 - 15   *Note: Due to a large number of results and/or encounters for the requested time period, some results have not been displayed. A complete set of results can be found in Results Review.     Studies/Results Radiology     MEDS, Scheduled  apixaban  5 mg Oral BID   Chlorhexidine Gluconate Cloth  6 each Topical Daily   feeding supplement  237 mL Oral BID BM   FLUoxetine  40 mg Oral Daily   gabapentin  600 mg Oral QHS   melatonin  10 mg Oral QHS   metoprolol tartrate  25 mg Oral BID   multivitamin with minerals  1 tablet Oral Daily   predniSONE  15 mg Oral Daily   pyridOXINE  50 mg Oral Daily   sodium chloride flush  10-40 mL Intracatheter Q12H   vitamin B-12  1,000 mcg Oral Daily     Assessment: Cellulitis LLE US shows phlegmon over presumably the SFA.  Collection  is small (2cm)  Plan: Abx per ID for now.  Would not recommend any further debridement or drainage of the wound at this time   Will f/u again on Mon    LOS: 3 days    Rosario Adie, MD [facility], [place]    12/23/2020 8:17 AM

## 2020-12-24 DIAGNOSIS — E876 Hypokalemia: Secondary | ICD-10-CM | POA: Diagnosis not present

## 2020-12-24 DIAGNOSIS — L03116 Cellulitis of left lower limb: Secondary | ICD-10-CM | POA: Diagnosis not present

## 2020-12-24 DIAGNOSIS — W19XXXA Unspecified fall, initial encounter: Secondary | ICD-10-CM | POA: Diagnosis not present

## 2020-12-24 LAB — BASIC METABOLIC PANEL
Anion gap: 5 (ref 5–15)
BUN: 16 mg/dL (ref 8–23)
CO2: 27 mmol/L (ref 22–32)
Calcium: 7.8 mg/dL — ABNORMAL LOW (ref 8.9–10.3)
Chloride: 107 mmol/L (ref 98–111)
Creatinine, Ser: 0.68 mg/dL (ref 0.61–1.24)
GFR, Estimated: >60 mL/min (ref >60)
Glucose, Bld: 70 mg/dL (ref 70–99)
Potassium: 4 mmol/L (ref 3.5–5.1)
Sodium: 139 mmol/L (ref 135–145)

## 2020-12-24 LAB — CBC WITH DIFFERENTIAL/PLATELET
Abs Immature Granulocytes: 0.07 10*3/uL (ref 0.00–0.07)
Basophils Absolute: 0 10*3/uL (ref 0.0–0.1)
Basophils Relative: 0 %
Eosinophils Absolute: 0 10*3/uL (ref 0.0–0.5)
Eosinophils Relative: 0 %
HCT: 37.9 % — ABNORMAL LOW (ref 39.0–52.0)
Hemoglobin: 12.1 g/dL — ABNORMAL LOW (ref 13.0–17.0)
Immature Granulocytes: 1 %
Lymphocytes Relative: 14 %
Lymphs Abs: 1.3 10*3/uL (ref 0.7–4.0)
MCH: 31.1 pg (ref 26.0–34.0)
MCHC: 31.9 g/dL (ref 30.0–36.0)
MCV: 97.4 fL (ref 80.0–100.0)
Monocytes Absolute: 1 10*3/uL (ref 0.1–1.0)
Monocytes Relative: 10 %
Neutro Abs: 7 10*3/uL (ref 1.7–7.7)
Neutrophils Relative %: 75 %
Platelets: 166 10*3/uL (ref 150–400)
RBC: 3.89 MIL/uL — ABNORMAL LOW (ref 4.22–5.81)
RDW: 13.2 % (ref 11.5–15.5)
WBC: 9.4 10*3/uL (ref 4.0–10.5)
nRBC: 0 % (ref 0.0–0.2)

## 2020-12-24 MED ORDER — PANTOPRAZOLE SODIUM 40 MG PO TBEC
40.0000 mg | DELAYED_RELEASE_TABLET | Freq: Every day | ORAL | Status: DC
  Administered 2020-12-24 – 2020-12-27 (×4): 40 mg via ORAL
  Filled 2020-12-24 (×4): qty 1

## 2020-12-24 NOTE — Progress Notes (Signed)
PROGRESS NOTE    [name]  AGT:[id number] DOB: 09/10/1945 DOA: 12/20/2020 PCP: Cassandria Anger, MD   Chief Complaint  Patient presents with   Fall   Back Pain   Leg Pain    Brief Narrative:  Patient 75 year old gentleman history of malignant melanoma, paroxysmal A. fib on Eliquis, anxiety presented after a fall.  Patient denies any LOC and remembers tripping over a rug.  CT head CT C-spine done was negative.  Patient noted to be hypokalemic elevated also concern for left groin cellulitis with a leukocytosis and fever.  Patient placed on vancomycin.   Assessment & Plan:   Principal Problem:   Cellulitis Active Problems:   Dehydration   Pressure injury of skin   Fall   Hyperbilirubinemia   Malignant melanoma ([place])   Open thigh wound   Citrobacter infection   Pseudomonas infection  #1 left upper thigh/groin cellulitis surrounding chronic wound/right shin hematoma/SIRS -Patient noted to have a chronic wound after lymph node biopsy 08/2020 and postradiation. -Patient admitted with concerns for SIRS with a fever, leukocytosis, concern for left groin/left upper thigh cellulitis. -Erythema improving. -Wound culture noted to have grown Citrobacter on 8/172022 and received Bactrim in outpatient setting. -Blood cultures with no growth to date. -Leukocytosis trending down. -Continue empiric IV vancomycin. -Continue home oral Dilaudid pain regimen. -Patient seen in consultation by ID, Dr.Van Dam who is recommending continuation of vancomycin at this time with follow-up on ultrasound of the leg and assess. -Lower extremity ultrasound negative for DVT however shows a complex heterogeneous/hypoechoic structure in the area of the groin 3.2 x 2.5 x 1.7 cm.   -General surgery consulted and recommending continuation of IV antibiotics as recommended per ID with no further debridement or drainage of the wound at this time. -W OC RN assessed patient and recommendations made.  2.   Fall/generalized weakness -PT/OT.  3.  History of malignant melanoma -Currently on radiation/chemo with Dr. Marin Olp, Dr. Dwana Curd. -Dr. Marin Olp with oncology notified of admission via epic and following. -Outpatient follow-up with oncology  4.  Normocytic anemia/mild thrombocytopenia -No evidence of bleeding noted. -Monitor on Eliquis.  5.  Anxiety -Continue home regimen Prozac  6.  Hypokalemia Magnesium at 2.7. -Potassium on admission was 2.4 and repleted and currently of 4.0.  -Monitor with diuretics.   7.  Hyperbilirubinemia -Noted to be elevated since August. -Bilirubin trended down and hyperbilirubinemia resolved -Right upper quadrant ultrasound somewhat limited due to body habitus and lack of acoustic window otherwise unremarkable.  No biliary ductal dilatation identified. -Outpatient follow-up.  8.  Dehydration Was on IV fluids which have been kvo. -Follow.  9.  History of PE/DVT -Eliquis.   10.  Dyspnea -Patient noted to have some dyspnea and noted to be tachypneic on 12/22/2020. -Patient placed on scheduled nebs. -Patient IV fluids with saline locked and patient given a dose of IV Lasix (12/22/2020)with a urine output of 1.7 L over the past 24 hours. -Patient on torsemide as needed prior to admission. -Patient placed on Lasix 40 mg daily with urine output of 500 cc recorded and significant clinical improvement. -Patient requesting for diuretics to be held. -We will hold Lasix and may consider resuming on a every other day basis versus as needed.  11.  Pressure injury, POA Pressure Injury 12/20/20 Buttocks Left;Proximal Stage 3 -  Full thickness tissue loss. Subcutaneous fat may be visible but bone, tendon or muscle are NOT exposed. (Active)  12/20/20 1800  Location: Buttocks  Location Orientation: Left;Proximal  Staging: Stage 3 -  Full thickness tissue loss. Subcutaneous fat may be visible but bone, tendon or muscle are NOT exposed.  Wound Description (Comments):    Present on Admission: Yes         DVT prophylaxis: Eliquis Code Status: DNR Family Communication: Updated patient.  No family at bedside. Disposition:   Status is: Inpatient  Remains inpatient appropriate because:Inpatient level of care appropriate due to severity of illness  Dispo: The patient is from: Home              Anticipated d/c is to: Home              Patient currently is not medically stable to d/c.   Difficult to place patient No       Consultants:  Oncology: Dr. Marin Olp 12/22/2020 ID: Dr. Tommy Medal 12/22/2020 General surgery: Dr. Redmond Pulling 12/22/2020  Procedures:  CT head CT C-spine 12/20/2020 Chest x-ray 12/20/2020 Plain films of the right tib-fib 12/20/2020 Plain films of the pelvis 12/20/2020 Pain.  The right foot 12/20/2020 Abdominal ultrasound 12/20/2020 Lower extremity ultrasound/Dopplers 12/22/2020  Antimicrobials:  IV vancomycin 12/21/2020>>>>   Subjective: Patient states he has been peeing too much and feels Lasix is working too much and requesting for it to be stopped.  Wishes he is able to go to the bathroom and use the toilet instead of the bedside urinal.  Objective: Vitals:   12/23/20 1257 12/23/20 1950 12/24/20 0544 12/24/20 1307  BP: (!) 154/88 (!) 143/85 (!) 151/90 (!) 146/88  Pulse: 63 69 72 71  Resp: (!) 22 16 20 20   Temp: 98.8 F (37.1 C) 98 F (36.7 C) 98.4 F (36.9 C) 98.4 F (36.9 C)  TempSrc:  Oral Oral Oral  SpO2: 94% 93% 92% 94%   No intake or output data in the 24 hours ending 12/24/20 1324  There were no vitals filed for this visit.  Examination:  General exam: NAD. Respiratory system: CTA B.  No wheezes, no crackles, no rhonchi.  Normal respiratory effort.   Cardiovascular system: Regular rate rhythm no murmurs rubs or gallops.  No JVD.  No lower extremity edema. Gastrointestinal system: Abdomen is soft, nontender, nondistended, positive bowel sounds.  No rebound.  No guarding. Central nervous system: Alert and  oriented. No focal neurological deficits. Extremities: Symmetric 5 x 5 power. Skin: Left upper thigh decreasing surrounding erythema around wound. Right shin with dark purple hematoma with intact skin no drainage noted, no surrounding erythema.  Psychiatry: Judgement and insight appear normal. Mood & affect appropriate.          Data Reviewed: I have personally reviewed following labs and imaging studies  CBC: Recent Labs  Lab 12/20/20 0911 12/20/20 1939 12/21/20 0928 12/22/20 0406 12/23/20 0338 12/24/20 0657  WBC 16.1* 12.7* 11.6* 10.7* 7.3 9.4  NEUTROABS 14.3*  --  10.1* 9.4*  --  7.0  HGB 11.4* 10.7* 10.8* 10.4* 10.0* 12.1*  HCT 37.0* 35.5* 36.2* 34.4* 32.9* 37.9*  MCV 97.6 100.9* 102.0* 100.9* 99.7 97.4  PLT 131* 114* 126* 136* 143* 166     Basic Metabolic Panel: Recent Labs  Lab 12/20/20 0911 12/20/20 1939 12/21/20 0928 12/22/20 0406 12/23/20 0338 12/24/20 0850  NA 142 144 141 138 136 139  K 2.4* 2.6* 3.4* 4.3 4.3 4.0  CL 95* 99 104 105 102 107  CO2 37* 32 32 31 30 27   GLUCOSE 99 86 232* 99 84 70  BUN 30* 38* 32* 26* 19 16  CREATININE 0.98  1.06 0.96 0.78 0.68 0.68  CALCIUM 8.4* 7.7* 7.9* 7.7* 7.8* 7.8*  MG 2.7*  --  2.6*  --   --   --      GFR: CrCl cannot be calculated (Unknown ideal weight.).  Liver Function Tests: Recent Labs  Lab 12/20/20 0911 12/20/20 1939 12/21/20 0928  AST 26 26 40  ALT 19 19 26   ALKPHOS 47 45 44  BILITOT 2.0* 1.5* 0.9  PROT 5.6* 5.3* 5.5*  ALBUMIN 2.9* 2.9* 2.7*     CBG: No results for input(s): GLUCAP in the last 168 hours.   Recent Results (from the past 240 hour(s))  Culture, blood (routine x 2)     Status: None (Preliminary result)   Collection Time: 12/20/20  9:21 AM   Specimen: BLOOD  Result Value Ref Range Status   Specimen Description   Final    BLOOD PORTA CATH Performed at [facility] [address] [place], [place] [address]    Special Requests   Final    BOTTLES DRAWN  AEROBIC AND ANAEROBIC Blood Culture adequate volume Performed at [facility] [address] [place], [place] [address]    Culture   Final    NO GROWTH 4 DAYS Performed at [facility], [place] [address], [place], [place] [address]    Report Status PENDING  Incomplete  Resp Panel by RT-PCR (Flu A&B, Covid) Nasopharyngeal Swab     Status: None   Collection Time: 12/20/20  9:22 AM   Specimen: Nasopharyngeal Swab; Nasopharyngeal(NP) swabs in vial transport medium  Result Value Ref Range Status   SARS Coronavirus 2 by RT PCR NEGATIVE NEGATIVE Final    Comment: (NOTE) SARS-CoV-2 target nucleic acids are NOT DETECTED.  The SARS-CoV-2 RNA is generally detectable in upper respiratory specimens during the acute phase of infection. The lowest concentration of SARS-CoV-2 viral copies this assay can detect is 138 copies/mL. A negative result does not preclude SARS-Cov-2 infection and should not be used as the sole basis for treatment or other patient management decisions. A negative result may occur with  improper specimen collection/handling, submission of specimen other than nasopharyngeal swab, presence of viral mutation(s) within the areas targeted by this assay, and inadequate number of viral copies(<138 copies/mL). A negative result must be combined with clinical observations, patient history, and epidemiological information. The expected result is Negative.  Fact Sheet for Patients:  EntrepreneurPulse.com.au  Fact Sheet for Healthcare Providers:  IncredibleEmployment.be  This test is no t yet approved or cleared by the [place] FDA and  has been authorized for detection and/or diagnosis of SARS-CoV-2 by FDA under an Emergency Use Authorization (EUA). This EUA will remain  in effect (meaning this test can be used) for the duration of the COVID-19 declaration under Section 564(b)(1) of the Act, 21 U.S.C.section  360bbb-3(b)(1), unless the authorization is terminated  or revoked sooner.       Influenza A by PCR NEGATIVE NEGATIVE Final   Influenza B by PCR NEGATIVE NEGATIVE Final    Comment: (NOTE) The Xpert Xpress SARS-CoV-2/FLU/RSV plus assay is intended as an aid in the diagnosis of influenza from Nasopharyngeal swab specimens and should not be used as a sole basis for treatment. Nasal washings and aspirates are unacceptable for Xpert Xpress SARS-CoV-2/FLU/RSV testing.  Fact Sheet for Patients: EntrepreneurPulse.com.au  Fact Sheet for Healthcare Providers: IncredibleEmployment.be  This test is not yet approved or cleared by the [place] FDA and has been authorized for detection and/or  diagnosis of SARS-CoV-2 by FDA under an Emergency Use Authorization (EUA). This EUA will remain in effect (meaning this test can be used) for the duration of the COVID-19 declaration under Section 564(b)(1) of the Act, 21 U.S.C. section 360bbb-3(b)(1), unless the authorization is terminated or revoked.  Performed at [facility], [place] [address] [place], [place] [address]   Culture, blood (routine x 2)     Status: None (Preliminary result)   Collection Time: 12/20/20  9:26 AM   Specimen: BLOOD  Result Value Ref Range Status   Specimen Description   Final    BLOOD LEFT ANTECUBITAL Performed at [facility] [address] [place], [place] [address]    Special Requests   Final    BOTTLES DRAWN AEROBIC ONLY Blood Culture adequate volume Performed at [facility] [address] [place], [place] [address]    Culture   Final    NO GROWTH 4 DAYS Performed at [facility], [place] [address], [place], [place] [address]    Report Status PENDING  Incomplete          Radiology Studies: VAS [place] LOWER EXTREMITY VENOUS (DVT)  Result Date: 12/22/2020  Lower Venous DVT Study Patient Name:  [name]  Date of Exam:   12/22/2020 Medical Rec #: [id number]           Accession #:    [id number] Date of Birth: September 17, 1945           Patient Gender: M Patient Age:   56 years Exam Location:  [facility] Procedure:      VAS [place] LOWER EXTREMITY VENOUS (DVT) Referring Phys: Claiborne Billings OSBORNE --------------------------------------------------------------------------------  Indications: Pain, Swelling, and Erythema. Other Indications: LLE melanoma and lymph node excision in 08/2020 and per                    patient has never healed. Risk Factors: Chemotherapy HX of PE 05/2019 Cancer HX of melanoma DVT HX 05/2019. Anticoagulation: Eliquis - for AFIB. Limitations: Body habitus and poor ultrasound/tissue interface. Comparison Study: Previous exam 08/03/2020 - negative for DVT. Performing Technologist: Rogelia Rohrer RVT, RDMS  Examination Guidelines: A complete evaluation includes B-mode imaging, spectral Doppler, color Doppler, and power Doppler as needed of all accessible portions of each vessel. Bilateral testing is considered an integral part of a complete examination. Limited examinations for reoccurring indications may be performed as noted. The reflux portion of the exam is performed with the patient in reverse Trendelenburg.  +-----+---------------+---------+-----------+----------+--------------+ RIGHTCompressibilityPhasicitySpontaneityPropertiesThrombus Aging +-----+---------------+---------+-----------+----------+--------------+ CFV  Full           Yes      Yes                                 +-----+---------------+---------+-----------+----------+--------------+   +---------+---------------+---------+-----------+----------+-------------------+ LEFT     CompressibilityPhasicitySpontaneityPropertiesThrombus Aging      +---------+---------------+---------+-----------+----------+-------------------+ CFV      Full           Yes      Yes                                       +---------+---------------+---------+-----------+----------+-------------------+ SFJ      Full                                                             +---------+---------------+---------+-----------+----------+-------------------+  FV Prox  Full           Yes      Yes                                      +---------+---------------+---------+-----------+----------+-------------------+ FV Mid   Full           Yes      Yes                                      +---------+---------------+---------+-----------+----------+-------------------+ FV DistalFull           Yes      Yes                                      +---------+---------------+---------+-----------+----------+-------------------+ PFV      Full                                                             +---------+---------------+---------+-----------+----------+-------------------+ POP      Full           Yes      Yes                                      +---------+---------------+---------+-----------+----------+-------------------+ PTV                                                   Not well visualized +---------+---------------+---------+-----------+----------+-------------------+ PERO                                                  Not well visualized +---------+---------------+---------+-----------+----------+-------------------+ Poor visualization of PTV and PeroV due to excesive edema.   Summary: RIGHT: - No evidence of common femoral vein obstruction.  LEFT: - Portions of this examination were limited- see technologist comments above. - There is no evidence of deep vein thrombosis in the lower extremity. - There is no evidence of superficial venous thrombosis.  Compex heterogenous/ hypoechoic structure seen in area of groin (3.2 x 2.5 x 1.7 cm).  *See table(s) above for measurements and observations.    Preliminary         Scheduled Meds:  apixaban  5 mg Oral BID   Chlorhexidine  Gluconate Cloth  6 each Topical Daily   feeding supplement  237 mL Oral BID BM   FLUoxetine  40 mg Oral Daily   furosemide  40 mg Oral Daily   gabapentin  600 mg Oral QHS   melatonin  10 mg Oral QHS   metoprolol tartrate  25 mg Oral BID   multivitamin with minerals  1 tablet Oral Daily   pantoprazole  40 mg Oral Q0600   predniSONE  15 mg Oral Daily   pyridOXINE  50 mg Oral Daily   sodium chloride flush  10-40 mL Intracatheter Q12H   vitamin B-12  1,000 mcg Oral Daily   Continuous Infusions:  vancomycin 1,250 mg (12/24/20 1301)     LOS: 4 days    Time spent: 35 minutes    Irine Seal, MD [facility]   To contact the attending provider between 7A-7P or the covering provider during after hours 7P-7A, please log into the web site [email or web address] and access using universal [place] password for that web site. If you do not have the password, please call the hospital operator.  12/24/2020, 1:24 PM

## 2020-12-24 NOTE — Progress Notes (Signed)
Repeat Labwork drawn from portacath; while obtaining labs, pt was noted to be quite anxious; said he "had a bad night"; pt very upset that "I just want my body back; I have arthritis in my shoulders, hands, knees,everywhere;  I can't do anything;" Stayed with pt a few minutes; gave him his cell phone; he said "Debbie , my wife, will be coming in today;"  tech has gotten him some coffee.  Pt watching tv at present with the door open.

## 2020-12-25 ENCOUNTER — Other Ambulatory Visit (HOSPITAL_COMMUNITY): Payer: Self-pay

## 2020-12-25 ENCOUNTER — Encounter: Payer: Self-pay | Admitting: Hematology & Oncology

## 2020-12-25 DIAGNOSIS — W19XXXA Unspecified fall, initial encounter: Secondary | ICD-10-CM | POA: Diagnosis not present

## 2020-12-25 DIAGNOSIS — Z889 Allergy status to unspecified drugs, medicaments and biological substances status: Secondary | ICD-10-CM

## 2020-12-25 DIAGNOSIS — S71102D Unspecified open wound, left thigh, subsequent encounter: Secondary | ICD-10-CM | POA: Diagnosis not present

## 2020-12-25 DIAGNOSIS — L02419 Cutaneous abscess of limb, unspecified: Secondary | ICD-10-CM

## 2020-12-25 DIAGNOSIS — L03116 Cellulitis of left lower limb: Secondary | ICD-10-CM | POA: Diagnosis not present

## 2020-12-25 DIAGNOSIS — F329 Major depressive disorder, single episode, unspecified: Secondary | ICD-10-CM

## 2020-12-25 DIAGNOSIS — E860 Dehydration: Secondary | ICD-10-CM | POA: Diagnosis not present

## 2020-12-25 LAB — RESP PANEL BY RT-PCR (FLU A&B, COVID) ARPGX2
Influenza A by PCR: NEGATIVE
Influenza B by PCR: NEGATIVE
SARS Coronavirus 2 by RT PCR: NEGATIVE

## 2020-12-25 LAB — RENAL FUNCTION PANEL
Albumin: 2.9 g/dL — ABNORMAL LOW (ref 3.5–5.0)
Anion gap: 9 (ref 5–15)
BUN: 16 mg/dL (ref 8–23)
CO2: 29 mmol/L (ref 22–32)
Calcium: 8.1 mg/dL — ABNORMAL LOW (ref 8.9–10.3)
Chloride: 100 mmol/L (ref 98–111)
Creatinine, Ser: 0.67 mg/dL (ref 0.61–1.24)
GFR, Estimated: >60 mL/min (ref >60)
Glucose, Bld: 97 mg/dL (ref 70–99)
Phosphorus: 3.4 mg/dL (ref 2.5–4.6)
Potassium: 3.6 mmol/L (ref 3.5–5.1)
Sodium: 138 mmol/L (ref 135–145)

## 2020-12-25 LAB — CBC WITH DIFFERENTIAL/PLATELET
Abs Immature Granulocytes: 0.07 10*3/uL (ref 0.00–0.07)
Basophils Absolute: 0 10*3/uL (ref 0.0–0.1)
Basophils Relative: 0 %
Eosinophils Absolute: 0 10*3/uL (ref 0.0–0.5)
Eosinophils Relative: 0 %
HCT: 37.1 % — ABNORMAL LOW (ref 39.0–52.0)
Hemoglobin: 11.6 g/dL — ABNORMAL LOW (ref 13.0–17.0)
Immature Granulocytes: 1 %
Lymphocytes Relative: 18 %
Lymphs Abs: 1.5 10*3/uL (ref 0.7–4.0)
MCH: 30.1 pg (ref 26.0–34.0)
MCHC: 31.3 g/dL (ref 30.0–36.0)
MCV: 96.1 fL (ref 80.0–100.0)
Monocytes Absolute: 0.9 10*3/uL (ref 0.1–1.0)
Monocytes Relative: 11 %
Neutro Abs: 5.8 10*3/uL (ref 1.7–7.7)
Neutrophils Relative %: 70 %
Platelets: 192 10*3/uL (ref 150–400)
RBC: 3.86 MIL/uL — ABNORMAL LOW (ref 4.22–5.81)
RDW: 13.3 % (ref 11.5–15.5)
WBC: 8.3 10*3/uL (ref 4.0–10.5)
nRBC: 0 % (ref 0.0–0.2)

## 2020-12-25 LAB — CULTURE, BLOOD (ROUTINE X 2)
Culture: NO GROWTH
Culture: NO GROWTH
Special Requests: ADEQUATE
Special Requests: ADEQUATE

## 2020-12-25 LAB — MAGNESIUM: Magnesium: 1.9 mg/dL (ref 1.7–2.4)

## 2020-12-25 MED ORDER — FLUOXETINE HCL 20 MG PO CAPS: 20.0000 mg | ORAL_CAPSULE | Freq: Every day | ORAL | 0 refills | Status: DC

## 2020-12-25 MED ORDER — LINEZOLID 600 MG PO TABS
600.0000 mg | ORAL_TABLET | Freq: Two times a day (BID) | ORAL | Status: DC
  Administered 2020-12-25 – 2020-12-27 (×5): 600 mg via ORAL
  Filled 2020-12-25 (×5): qty 1

## 2020-12-25 MED ORDER — FLUOXETINE HCL 40 MG PO CAPS: 40.0000 mg | ORAL_CAPSULE | Freq: Every day | ORAL | 3 refills | Status: DC

## 2020-12-25 MED ORDER — FUROSEMIDE 40 MG PO TABS
40.0000 mg | ORAL_TABLET | Freq: Every day | ORAL | Status: DC
  Administered 2020-12-26: 40 mg via ORAL
  Filled 2020-12-25: qty 1

## 2020-12-25 MED ORDER — LINEZOLID 600 MG PO TABS: 600.0000 mg | ORAL_TABLET | Freq: Two times a day (BID) | ORAL | 0 refills | Status: AC

## 2020-12-25 MED ORDER — SULFAMETHOXAZOLE-TRIMETHOPRIM 800-160 MG PO TABS: 1.0000 | ORAL_TABLET | Freq: Two times a day (BID) | ORAL | Status: DC

## 2020-12-25 MED ORDER — FLUOXETINE HCL 20 MG PO CAPS
20.0000 mg | ORAL_CAPSULE | Freq: Every day | ORAL | Status: DC
  Administered 2020-12-26 – 2020-12-27 (×2): 20 mg via ORAL
  Filled 2020-12-25 (×2): qty 1

## 2020-12-25 NOTE — NC FL2 (Addendum)
[place] LEVEL OF CARE SCREENING TOOL     IDENTIFICATION  Patient Name: [name] Birthdate: February 13, 1946 Sex: male Admission Date (Current Location): 12/20/2020  [facility] and [place] Number:  Herbalist and Address:  [facility],  [place], [place]      Provider Number: [id number]  Attending Physician Name and Address:  Eugenie Filler, MD  Relative Name and Phone Number:  Croft,Debbie (Significant other)   [phone]    Current Level of Care: Hospital Recommended Level of Care: SNF Prior Approval Number:    Date Approved/Denied:   PASRR Number:    Discharge Plan: SNF    Current Diagnoses: Patient Active Problem List   Diagnosis Date Noted   Open thigh wound    Citrobacter infection    Pseudomonas infection    Pressure injury of skin 12/21/2020   Fall    Hyperbilirubinemia    Malignant melanoma ([place])    Cellulitis 12/20/2020   Blisters of multiple sites 11/05/2020   Goals of care, counseling/discussion 10/12/2020   Atherosclerosis of aorta ([place]) 07/24/2020   Symptomatic anemia 05/27/2020   Pulmonary embolus ([place]) 05/27/2020   Acute respiratory failure with hypoxia ([place]) 05/26/2020   Callus of foot 04/21/2018   Edema 03/27/2018   Toxic gastroenteritis and colitis 03/25/2018   Other ulcerative 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 ([place]) 19/50/9326   Diarrhea 12/23/2017   Nausea & vomiting 12/23/2017   Herpes zoster 07/07/2017   Constipation 04/21/2017   Genetic testing 03/06/2017   Family history of cancer    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[place]) 02/12/2017   Iron deficiency anemia due to chronic blood loss 12/27/2016   Scalp wound 06/13/2016   Scalp laceration, subsequent encounter 06/04/2016   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   Pain in joint, ankle and foot 09/28/2015   Anemia due to chronic blood loss 09/28/2015   Hyponatremia 09/28/2015   Cramp in limb 09/28/2015   Exertional  angina ([place]) 06/17/2014   DOE (dyspnea on exertion) 04/28/2014   Hypokalemia 03/11/2014   Counseling regarding goals of care 12/31/2013   Impaired glucose tolerance 12/31/2013   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[place]) 11/18/2013   Fever 11/18/2013   Leukocytosis 10/30/2013   Fatigue 10/22/2013   Hoarseness of voice 08/10/2013   Sleep disorder 07/05/2013   Tension headache 07/01/2013   Polypharmacy 06/19/2013   Lumbar post-laminectomy syndrome 05/27/2013   Degenerative arthritis of spine 05/26/2013   Hemochromatosis 05/26/2013   Wound infection after surgery 05/26/2013   Normocytic anemia 05/26/2013   Unintentional weight loss 05/26/2013   Protein-calorie malnutrition, severe ([place]) 05/25/2013   Chronic pain syndrome 05/23/2013   COPD (chronic obstructive pulmonary disease) ([place]) 05/06/2013   Frequent falls 05/06/2013   Encounter for postoperative wound care 05/06/2013   Dehydration 03/17/2013   Atrial fibrillation ([place]) 08/07/2010   Rheumatoid arthritis ([place]) 10/18/2008   Anxiety 11/09/2007   Elevated lipids 03/30/2007   PSEUDOGOUT 03/30/2007   Essential hypertension 03/30/2007   Coronary atherosclerosis 03/30/2007    Orientation RESPIRATION BLADDER Height & Weight     Self, Situation, Place  Normal Continent Weight:   Height:     BEHAVIORAL SYMPTOMS/MOOD NEUROLOGICAL BOWEL NUTRITION STATUS      Continent  (Heart Healthy)  AMBULATORY STATUS COMMUNICATION OF NEEDS Skin   Extensive Assist Verbally Other (Comment) (Incision, L thigh; Pressure Injury, L buttocks)  Personal Care Assistance Level of Assistance  Bathing, Feeding, Dressing Bathing Assistance: Extensive assistance Feeding assistance: Independent Dressing Assistance: Limited assistance     Functional Limitations Info  Sight, Hearing, Speech Sight Info: Adequate Hearing Info: Adequate Speech Info: Adequate    SPECIAL CARE FACTORS FREQUENCY  PT (By licensed PT), OT (By licensed OT)      PT Frequency: 5X/W OT Frequency: 5X/W            Contractures Contractures Info: Not present    Additional Factors Info  Code Status, Allergies Code Status Info: DNR Allergies Info: Cefepime   Cephaeline   Morphine   Morphine And Related   Penicillins   Doxycycline   Oxycodone-acetaminophen            Discharge Medications:  albuterol 108 (90 Base) MCG/ACT inhaler Commonly known as: VENTOLIN HFA Inhale 1-2 puffs into the lungs every 4 (four) hours as needed for wheezing or shortness of breath.           ALPRAZolam 0.5 MG tablet Commonly known as: XANAX Take 1 tablet (0.5 mg total) by mouth 3 (three) times daily as needed for anxiety. for anxiety          apixaban 5 MG Tabs tablet Commonly known as: ELIQUIS Take 1 tablet (5 mg total) by mouth 2 (two) times daily.          B-6 PO Take 1 tablet by mouth daily.         Icon medications to change how you take   * FLUoxetine 20 MG capsule Commonly known as: PROZAC Start taking on: December 26, 2020 Take 1 capsule (20 mg total) by mouth daily for 5 days. What changed: You were already taking a medication with the same name, and this prescription was added. Make sure you understand how and when to take each.         Icon medications to change how you take   * FLUoxetine 40 MG capsule Commonly known as: PROZAC Start taking on: January 01, 2021 Take 1 capsule (40 mg total) by mouth daily. What changed: These instructions start on January 01, 2021. If you are unsure what to do until then, ask your doctor or other care provider.          fluticasone 50 MCG/ACT nasal spray Commonly known as: FLONASE INSTILL 2 SPRAYS IN EACH NOSTRIL EVERY DAY          gabapentin 300 MG capsule Commonly known as: NEURONTIN Take 2 capsules (600 mg total) by mouth at bedtime.          HYDROmorphone 2 MG tablet Commonly known as: DILAUDID Take 1-1.5 tablets (2-3 mg total) by mouth every 8 (eight) hours as needed for severe pain. For: Moderate  to Severe Pain          lidocaine-prilocaine cream Commonly known as: EMLA Apply 1 application topically as needed.         Icon medications to start taking   linezolid 600 MG tablet Commonly known as: ZYVOX Take 1 tablet (600 mg total) by mouth every 12 (twelve) hours for 5 days.          Melatonin 10 MG Tabs Take 10 mg by mouth at bedtime.          metoprolol tartrate 25 MG tablet Commonly known as: LOPRESSOR TAKE 1 TABLET TWO TIMES DAILY.          mupirocin ointment 2 % Commonly known as: BACTROBAN On leg wound  w/dressing change qd or bid          pantoprazole 40 MG tablet Commonly known as: PROTONIX Take 1 tablet (40 mg total) by mouth every evening.          polyvinyl alcohol 1.4 % ophthalmic solution Commonly known as: LIQUIFILM TEARS Place 1 drop into both eyes as needed for dry eyes.          predniSONE 5 MG tablet Commonly known as: DELTASONE Take 15 mg by mouth daily.          temazepam 15 MG capsule Commonly known as: RESTORIL Take 1 capsule (15 mg total) by mouth at bedtime as needed for sleep.          torsemide 100 MG tablet Commonly known as: DEMADEX Take 0.5-1 tablets (50-100 mg total) by mouth daily as needed.          vitamin B-12 1000 MCG tablet Commonly known as: CYANOCOBALAMIN Take 1,000 mcg by mouth daily.          Vitamin D3 50 MCG (2000 UT) capsule Take 1 capsule (2,000 Units total) by mouth daily.            Relevant Imaging Results:  Relevant Lab Results:   Additional Information [place], [place]

## 2020-12-25 NOTE — TOC Initial Note (Signed)
Transition of Care [facility]) - Initial/Assessment Note    Patient Details  Name: [name] MRN: [id number] Date of Birth: Aug 10, 1945  Transition of Care [facility]) CM/SW Contact:    Trish Mage, LCSW Phone Number: 12/25/2020, 3:41 PM  Clinical Narrative:   Patient seen in follow up to PT recommendation of [facility] PT/24 hr supervision.  Mr Abel states he was independent prior to admission, and that his wife has a bad back and she is unable to physically assist him with transfers. He is independent living at [facility], and is open to short term rehab.  Bed search process explained and initiated. TOC will continue to follow during the course of hospitalization.             Expected Discharge Plan: Skilled Nursing Facility Barriers to Discharge: SNF Pending bed offer   Patient Goals and CMS Choice Patient states their goals for this hospitalization and ongoing recovery are:: Get back to independence   Choice offered to / list presented to : Patient, Spouse  Expected Discharge Plan and Services Expected Discharge Plan: [place]   Discharge Planning Services: CM Consult Post Acute Care Choice: [place] Living arrangements for the past 2 months: [place] Expected Discharge Date: 12/25/20                                    Prior Living Arrangements/Services Living arrangements for the past 2 months: [place] Lives with:: Spouse Patient language and need for interpreter reviewed:: Yes        Need for Family Participation in Patient Care: Yes (Comment) Care giver support system in place?: Yes (comment) Current home services: DME Criminal Activity/Legal Involvement Pertinent to Current Situation/Hospitalization: No - Comment as needed  Activities of Daily Living Home Assistive Devices/Equipment: Grab bars around toilet, Grab bars in shower, Hand-held shower hose, Walker (specify type), Wheelchair (front  wheeled walker) ADL Screening (condition at time of admission) Patient's cognitive ability adequate to safely complete daily activities?: Yes Is the patient deaf or have difficulty hearing?: No Does the patient have difficulty seeing, even when wearing glasses/contacts?: No Does the patient have difficulty concentrating, remembering, or making decisions?: No Patient able to express need for assistance with ADLs?: Yes Does the patient have difficulty dressing or bathing?: Yes Independently performs ADLs?: No (worsening weakness) Communication: Independent Dressing (OT): Dependent Is this a change from baseline?: Change from baseline, expected to last >3 days Grooming: Dependent Is this a change from baseline?: Change from baseline, expected to last >3 days Feeding: Dependent Is this a change from baseline?: Change from baseline, expected to last >3 days Bathing: Dependent Is this a change from baseline?: Change from baseline, expected to last >3 days Toileting: Dependent Is this a change from baseline?: Change from baseline, expected to last >3days In/Out Bed: Dependent Is this a change from baseline?: Change from baseline, expected to last >3 days Walks in Home: Dependent Is this a change from baseline?: Change from baseline, expected to last >3 days Does the patient have difficulty walking or climbing stairs?: Yes (secondary to worsening weakness fall) Weakness of Legs: Both Weakness of Arms/Hands: Both  Permission Sought/Granted Permission sought to share information with : Family Supports Permission granted to share information with : Yes, Verbal Permission Granted  Share Information with NAME: Croft,Debbie (Significant other)   [phone]  Emotional Assessment Appearance:: Appears stated age Attitude/Demeanor/Rapport: Engaged Affect (typically observed): Appropriate Orientation: : Oriented to Self, Oriented to Place, Oriented to Situation Alcohol / Substance  Use: Not Applicable Psych Involvement: No (comment)  Admission diagnosis:  Hypokalemia [E87.6] Hyperbilirubinemia [E80.6] Cellulitis [L03.90] Fever [R50.9] Cellulitis of left lower extremity [K86.381] Fall, initial encounter [W19.XXXA] Fever, unspecified fever cause [R50.9] Patient Active Problem List   Diagnosis Date Noted   Open thigh wound    Citrobacter infection    Pseudomonas infection    Pressure injury of skin 12/21/2020   Fall    Hyperbilirubinemia    Malignant melanoma ([place])    Cellulitis 12/20/2020   Blisters of multiple sites 11/05/2020   Goals of care, counseling/discussion 10/12/2020   Atherosclerosis of aorta ([place]) 07/24/2020   Symptomatic anemia 05/27/2020   Pulmonary embolus ([place]) 05/27/2020   Acute respiratory failure with hypoxia ([place]) 05/26/2020   Callus of foot 04/21/2018   Edema 03/27/2018   Toxic gastroenteritis and colitis 03/25/2018   Other ulcerative 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 ([place]) 77/02/6578   Diarrhea 12/23/2017   Nausea & vomiting 12/23/2017   Herpes zoster 07/07/2017   Constipation 04/21/2017   Genetic testing 03/06/2017   Family history of cancer    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[place]) 02/12/2017   Iron deficiency anemia due to chronic blood loss 12/27/2016   Scalp wound 06/13/2016   Scalp laceration, subsequent encounter 06/04/2016   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   Pain in joint, ankle and foot 09/28/2015   Anemia due to chronic blood loss 09/28/2015   Hyponatremia 09/28/2015   Cramp in limb 09/28/2015   Exertional angina ([place]) 06/17/2014   DOE (dyspnea on exertion) 04/28/2014   Hypokalemia 03/11/2014   Counseling regarding goals of care 12/31/2013   Impaired glucose tolerance 12/31/2013   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[place]) 11/18/2013   Fever 11/18/2013   Leukocytosis 10/30/2013   Fatigue 10/22/2013   Hoarseness of voice 08/10/2013   Sleep disorder 07/05/2013   Tension headache 07/01/2013   Polypharmacy  06/19/2013   Lumbar post-laminectomy syndrome 05/27/2013   Degenerative arthritis of spine 05/26/2013   Hemochromatosis 05/26/2013   Wound infection after surgery 05/26/2013   Normocytic anemia 05/26/2013   Unintentional weight loss 05/26/2013   Protein-calorie malnutrition, severe ([place]) 05/25/2013   Chronic pain syndrome 05/23/2013   COPD (chronic obstructive pulmonary disease) ([place]) 05/06/2013   Frequent falls 05/06/2013   Encounter for postoperative wound care 05/06/2013   Dehydration 03/17/2013   Atrial fibrillation ([place]) 08/07/2010   Rheumatoid arthritis ([place]) 10/18/2008   Anxiety 11/09/2007   Elevated lipids 03/30/2007   PSEUDOGOUT 03/30/2007   Essential hypertension 03/30/2007   Coronary atherosclerosis 03/30/2007   PCP:  Cassandria Anger, MD Pharmacy:   [facility] [place], [place] AT [address] [place] [address] Phone: [phone] Fax: [phone]  [facility] ([facility]) - [place], [address] [address] Phone: [phone] Fax: [phone]     Social Determinants of Health (SDOH) Interventions    Readmission Risk Interventions No flowsheet data found.

## 2020-12-25 NOTE — Progress Notes (Signed)
Subjective: Feeling well.  Ready to get out of here.  Leg feeling better  ROS: See above, otherwise other systems negative  Objective: Vital signs in last 24 hours: Temp:  [97.8 F (36.6 C)-98.4 F (36.9 C)] 98 F (36.7 C) (09/19 0521) Pulse Rate:  [71-72] 72 (09/19 0521) Resp:  [20] 20 (09/19 0521) BP: (125-153)/(73-91) 153/91 (09/19 0521) SpO2:  [92 %-94 %] 92 % (09/19 0521) Last BM Date: 12/23/20  Intake/Output from previous day: 09/18 0701 - 09/19 0700 In: 660 [P.O.:660] Out: 750 [Urine:750] Intake/Output this shift: No intake/output data recorded.  PE: Skin: left thigh with much less cellulitis.  Wound is open and no purulent drainage noted.  Leg is overall less edematous  Lab Results:  Recent Labs    12/23/20 0338 12/24/20 0657  WBC 7.3 9.4  HGB 10.0* 12.1*  HCT 32.9* 37.9*  PLT 143* 166   BMET Recent Labs    12/23/20 0338 12/24/20 0850  NA 136 139  K 4.3 4.0  CL 102 107  CO2 30 27  GLUCOSE 84 70  BUN 19 16  CREATININE 0.68 0.68  CALCIUM 7.8* 7.8*   PT/INR No results for input(s): LABPROT, INR in the last 72 hours. CMP     Component Value Date/Time   NA 139 12/24/2020 0850   NA 140 04/10/2017 1046   NA 135 (L) 03/06/2017 1104   K 4.0 12/24/2020 0850   K 3.7 04/10/2017 1046   K 4.2 03/06/2017 1104   CL 107 12/24/2020 0850   CL 98 04/10/2017 1046   CO2 27 12/24/2020 0850   CO2 31 04/10/2017 1046   CO2 30 (H) 03/06/2017 1104   GLUCOSE 70 12/24/2020 0850   GLUCOSE 102 04/10/2017 1046   BUN 16 12/24/2020 0850   BUN 21 04/10/2017 1046   BUN 20.0 03/06/2017 1104   CREATININE 0.68 12/24/2020 0850   CREATININE 1.19 11/15/2020 0958   CREATININE 0.9 04/10/2017 1046   CREATININE 1.0 03/06/2017 1104   CALCIUM 7.8 (L) 12/24/2020 0850   CALCIUM 8.9 04/10/2017 1046   CALCIUM 9.4 03/06/2017 1104   PROT 5.5 (L) 12/21/2020 0928   PROT 5.8 (L) 04/10/2017 1046   PROT 6.6 03/06/2017 1104   ALBUMIN 2.7 (L) 12/21/2020 0928   ALBUMIN 3.2 (L)  04/10/2017 1046   ALBUMIN 3.7 03/06/2017 1104   AST 40 12/21/2020 0928   AST 14 (L) 11/15/2020 0958   AST 20 03/06/2017 1104   ALT 26 12/21/2020 0928   ALT 11 11/15/2020 0958   ALT 21 04/10/2017 1046   ALT 20 03/06/2017 1104   ALKPHOS 44 12/21/2020 0928   ALKPHOS 65 04/10/2017 1046   ALKPHOS 78 03/06/2017 1104   BILITOT 0.9 12/21/2020 0928   BILITOT 0.6 11/15/2020 0958   BILITOT 0.47 03/06/2017 1104   GFRNONAA >60 12/24/2020 0850   GFRNONAA >60 11/15/2020 0958   GFRAA >60 10/29/2019 1035   Lipase     Component Value Date/Time   LIPASE 35 11/18/2013 1408       Studies/Results: No results found.  Anti-infectives: Anti-infectives (From admission, onward)    Start     Dose/Rate Route Frequency Ordered Stop   12/21/20 1300  vancomycin (VANCOREADY) IVPB 1250 mg/250 mL        1,250 mg 166.7 mL/hr over 90 Minutes Intravenous Every 24 hours 12/20/20 1442     12/20/20 1230  vancomycin (VANCOREADY) IVPB 1500 mg/300 mL        1,500 mg  150 mL/hr over 120 Minutes Intravenous  Once 12/20/20 1130 12/20/20 1457        Assessment/Plan S/p excision of multiple melanoma lesions and LNs of left thigh, Dr. Barry Dienes 08/15/20 with cellulitis -improved on IV vanc.  Per Dr. Barry Dienes, once off vanc can switch to oral doxy.  Likely doesn't need much more than a week total of abx therapy. -site looks much better.  No plans or needs for surgical intervention -he has follow up arranged with Dr. Barry Dienes already in his chart -he is stable for DC when felt medically stable. -no further surgical needs or input at this time.  We will sign off.  Please call back if needed.    LOS: 5 days    [name] , [facility] 12/25/2020, 8:11 AM Please see Amion for pager number during day hours 7:00am-4:30pm or 7:00am -11:30am on weekends

## 2020-12-25 NOTE — TOC Benefit Eligibility Note (Signed)
Patient Teacher, English as a foreign language completed.    The patient is currently admitted and upon discharge could be taking Linezolid 600 mg.  The current 10 day co-pay is, $12.28.   The patient is insured through [employer], [place] Patient Advocate Specialist [place] Team Direct Number: [phone]  Fax: [phone]

## 2020-12-25 NOTE — Progress Notes (Addendum)
Subjective: No new complaints   Antibiotics:  Anti-infectives (From admission, onward)    Start     Dose/Rate Route Frequency Ordered Stop   12/25/20 1100  sulfamethoxazole-trimethoprim (BACTRIM DS) 800-160 MG per tablet 1 tablet  Status:  Discontinued        1 tablet Oral Every 12 hours 12/25/20 1002 12/25/20 1002   12/25/20 1100  linezolid (ZYVOX) tablet 600 mg        600 mg Oral Every 12 hours 12/25/20 1006     12/21/20 1300  vancomycin (VANCOREADY) IVPB 1250 mg/250 mL  Status:  Discontinued        1,250 mg 166.7 mL/hr over 90 Minutes Intravenous Every 24 hours 12/20/20 1442 12/25/20 1002   12/20/20 1230  vancomycin (VANCOREADY) IVPB 1500 mg/300 mL        1,500 mg 150 mL/hr over 120 Minutes Intravenous  Once 12/20/20 1130 12/20/20 1457       Medications: Scheduled Meds:  apixaban  5 mg Oral BID   Chlorhexidine Gluconate Cloth  6 each Topical Daily   feeding supplement  237 mL Oral BID BM   FLUoxetine  40 mg Oral Daily   gabapentin  600 mg Oral QHS   linezolid  600 mg Oral Q12H   melatonin  10 mg Oral QHS   metoprolol tartrate  25 mg Oral BID   multivitamin with minerals  1 tablet Oral Daily   pantoprazole  40 mg Oral Q0600   predniSONE  15 mg Oral Daily   pyridOXINE  50 mg Oral Daily   sodium chloride flush  10-40 mL Intracatheter Q12H   vitamin B-12  1,000 mcg Oral Daily   Continuous Infusions: PRN Meds:.albuterol, ALPRAZolam, fluticasone, HYDROmorphone, polyvinyl alcohol, temazepam    Objective: Weight change:   Intake/Output Summary (Last 24 hours) at 12/25/2020 1126 Last data filed at 12/24/2020 1734 Gross per 24 hour  Intake 420 ml  Output 300 ml  Net 120 ml   Blood pressure (!) 153/91, pulse 72, temperature 98 F (36.7 C), temperature source Oral, resp. rate 20, SpO2 92 %. Temp:  [97.8 F (36.6 C)-98.4 F (36.9 C)] 98 F (36.7 C) (09/19 0521) Pulse Rate:  [71-72] 72 (09/19 0521) Resp:  [20] 20 (09/19 0521) BP: (125-153)/(73-91) 153/91  (09/19 0521) SpO2:  [92 %-94 %] 92 % (09/19 0521)  Physical Exam: Physical Exam Constitutional:      Appearance: He is well-developed.  HENT:     Head: Normocephalic and atraumatic.  Eyes:     Conjunctiva/sclera: Conjunctivae normal.  Cardiovascular:     Rate and Rhythm: Normal rate and regular rhythm.  Pulmonary:     Effort: Pulmonary effort is normal. No respiratory distress.     Breath sounds: No wheezing.  Abdominal:     General: There is no distension.     Palpations: Abdomen is soft.  Musculoskeletal:        General: Normal range of motion.     Cervical back: Normal range of motion and neck supple.  Skin:    General: Skin is warm and dry.     Findings: No erythema or rash.  Neurological:     General: No focal deficit present.     Mental Status: He is alert and oriented to person, place, and time.  Psychiatric:        Mood and Affect: Mood normal.        Behavior: Behavior normal.  Thought Content: Thought content normal.        Judgment: Judgment normal.     Left thigh wound 12/22/2020:        12/25/2020:       CBC:    BMET Recent Labs    12/23/20 0338 12/24/20 0850  NA 136 139  K 4.3 4.0  CL 102 107  CO2 30 27  GLUCOSE 84 70  BUN 19 16  CREATININE 0.68 0.68  CALCIUM 7.8* 7.8*     Liver Panel  No results for input(s): PROT, ALBUMIN, AST, ALT, ALKPHOS, BILITOT, BILIDIR, IBILI in the last 72 hours.     Sedimentation Rate No results for input(s): ESRSEDRATE in the last 72 hours. C-Reactive Protein No results for input(s): CRP in the last 72 hours.  Micro Results: Recent Results (from the past 720 hour(s))  Culture, blood (routine x 2)     Status: None   Collection Time: 12/20/20  9:21 AM   Specimen: BLOOD  Result Value Ref Range Status   Specimen Description   Final    BLOOD PORTA CATH Performed at [facility] [address] [place], [place] [address]    Special Requests   Final    BOTTLES DRAWN  AEROBIC AND ANAEROBIC Blood Culture adequate volume Performed at [facility] [address], [place], [place] [address]    Culture   Final    NO GROWTH 5 DAYS Performed at [facility], [place] [address] [place], [place] [address]    Report Status 12/25/2020 FINAL  Final  Resp Panel by RT-PCR (Flu A&B, Covid) Nasopharyngeal Swab     Status: None   Collection Time: 12/20/20  9:22 AM   Specimen: Nasopharyngeal Swab; Nasopharyngeal(NP) swabs in vial transport medium  Result Value Ref Range Status   SARS Coronavirus 2 by RT PCR NEGATIVE NEGATIVE Final    Comment: (NOTE) SARS-CoV-2 target nucleic acids are NOT DETECTED.  The SARS-CoV-2 RNA is generally detectable in upper respiratory specimens during the acute phase of infection. The lowest concentration of SARS-CoV-2 viral copies this assay can detect is 138 copies/mL. A negative result does not preclude SARS-Cov-2 infection and should not be used as the sole basis for treatment or other patient management decisions. A negative result may occur with  improper specimen collection/handling, submission of specimen other than nasopharyngeal swab, presence of viral mutation(s) within the areas targeted by this assay, and inadequate number of viral copies(<138 copies/mL). A negative result must be combined with clinical observations, patient history, and epidemiological information. The expected result is Negative.  Fact Sheet for Patients:  EntrepreneurPulse.com.au  Fact Sheet for Healthcare Providers:  IncredibleEmployment.be  This test is no t yet approved or cleared by the [place] FDA and  has been authorized for detection and/or diagnosis of SARS-CoV-2 by FDA under an Emergency Use Authorization (EUA). This EUA will remain  in effect (meaning this test can be used) for the duration of the COVID-19 declaration under Section 564(b)(1) of the Act, 21 U.S.C.section  360bbb-3(b)(1), unless the authorization is terminated  or revoked sooner.       Influenza A by PCR NEGATIVE NEGATIVE Final   Influenza B by PCR NEGATIVE NEGATIVE Final    Comment: (NOTE) The Xpert Xpress SARS-CoV-2/FLU/RSV plus assay is intended as an aid in the diagnosis of influenza from Nasopharyngeal swab specimens and should not be used as a sole basis for treatment. Nasal washings and aspirates are unacceptable for Xpert Xpress SARS-CoV-2/FLU/RSV  testing.  Fact Sheet for Patients: EntrepreneurPulse.com.au  Fact Sheet for Healthcare Providers: IncredibleEmployment.be  This test is not yet approved or cleared by the [place] FDA and has been authorized for detection and/or diagnosis of SARS-CoV-2 by FDA under an Emergency Use Authorization (EUA). This EUA will remain in effect (meaning this test can be used) for the duration of the COVID-19 declaration under Section 564(b)(1) of the Act, 21 U.S.C. section 360bbb-3(b)(1), unless the authorization is terminated or revoked.  Performed at [facility], [place] [address], [place], [place] [address]   Culture, blood (routine x 2)     Status: None   Collection Time: 12/20/20  9:26 AM   Specimen: BLOOD  Result Value Ref Range Status   Specimen Description   Final    BLOOD LEFT ANTECUBITAL Performed at [facility] [address] [place], [place] [address]    Special Requests   Final    BOTTLES DRAWN AEROBIC ONLY Blood Culture adequate volume Performed at [facility] [address], [place], [place] [address]    Culture   Final    NO GROWTH 5 DAYS Performed at [facility], [place] [address] [place], [place] [address]    Report Status 12/25/2020 FINAL  Final    Studies/Results: No results found.    Assessment/Plan:  INTERVAL HISTORY: improvement in his erythema   Principal Problem:   Cellulitis Active  Problems:   Dehydration   Pressure injury of skin   Fall   Hyperbilirubinemia   Malignant melanoma ([place])   Open thigh wound   Citrobacter infection   Pseudomonas infection    [name] is a 75 y.o. male with multiple medical problems, prior postoperative lumbar infection with Pseudomonas aeruginosa, malignant melanoma status postresection and lymph node excision in May 2022 with problems of the seromas and nonhealing wounds admitted with infection involving 1 of these wounds which has improved on IV vancomycin.  My plan was to switch him to oral Keflex and doxycycline but he had apparent severe hives with cefepime and I cannot find documentation of him having had a cephalosporin or penicillin since then.  He also had a rash when he was given doxycycline in 2019 by Dr. Chase Caller.  #1 Cellulitis around non healing wound:  Switch to zyvox and would give another 5 days of therapy   I will schedule him for followup with me  #2 Multiple drug allergies: would be helpful if we can find documentation of him having had penicillins or cephalosporins since the apparent reaction to cefepime  #3 Depression: would drop the paxil dose in half while he is on zyvox    SHAYLON ADEN has an appointment on 01/10/2021 at 245 PM with Dr. Tommy Medal  The [facility] for Infectious Disease is located in the [facility] at  [place] in [place].  [address], which is located to the left of the elevators.  Phone: [phone]  Fax: [phone]  [email or web address]  He should arrive 15-30 minutes prior to his appointment.  I will sign off for now, please call with further questions.    LOS: 5 days   Alcide Evener 12/25/2020, 11:26 AM

## 2020-12-25 NOTE — Discharge Summary (Signed)
Physician Discharge Summary  GARL SPEIGNER UKG:[id number] DOB: [birth year] DOA: 12/20/2020  PCP: Cassandria Anger, MD  Admit date: 12/20/2020 Discharge date: 12/25/2020  Time spent: 60 minutes  Recommendations for Outpatient Follow-up:  Follow-up with Dr. Barry Dienes, general surgery on 01/08/2021 at 11:15 AM. Follow-up with Dr. Tommy Medal, ID on 01/10/2021 at 2:45 PM. Follow-up with Plotnikov, Evie Lacks, MD in 2 weeks.  On follow-up patient need a basic metabolic profile and magnesium level checked to follow-up on electrolytes and renal function.  Cellulitis will need to be followed up upon. Follow-up with Dr. Marin Olp as scheduled.   Discharge Diagnoses:  Principal Problem:   Cellulitis Active Problems:   Dehydration   Pressure injury of skin   Fall   Hyperbilirubinemia   Malignant melanoma ([place])   Open thigh wound   Citrobacter infection   Pseudomonas infection   Discharge Condition: Stable and improved  Diet recommendation: Heart healthy  There were no vitals filed for this visit.  History of present illness:  HPI per Dr. Tonette Lederer is a 75 y.o. male with medical history significant of malignant melanoma, pAfib on eliquis, anxiety. Presented after fall. He reports that he's felt week over the last couple of days. He had a near fall two nights ago, but was able to catch himself. Unfortunately, last night he was unable to catch himself. He states last night he was returning to his bed, when he tripped over a rug. He fell "straight down" on his butt and rolled to his back. He was unable to get up. He remembers the entire fall. There was no LOC. He did hit the back of his head. He was unable to get help for a few hours per his report. When he was found this morning, EMS was called for help. He denies any other aggravating or alleviating factors.    ED Course: CTH, c-spine negative for acute changes. K+ was 2.4. He was given 10 mEq of K+ IV. It was noted that he had left  groin cellulitis, an elevated WBC and an elevated temp. He was started on vanc. TRH was called for admission.   Hospital course #1 left upper thigh/groin cellulitis surrounding chronic wound/right shin hematoma/SIRS -Patient noted to have a chronic wound after lymph node biopsy 08/2020 and postradiation. -Patient admitted with concerns for SIRS with a fever, leukocytosis, concern for left groin/left upper thigh cellulitis. -Erythema improving. -Wound culture noted to have grown Citrobacter on 8/172022 and received Bactrim in outpatient setting. -Blood cultures with no growth to date. -Leukocytosis trended down and had resolved by day of discharge.. -Patient maintained on IV vancomycin during the hospitalization and subsequently transition to oral Zyvox on day of discharge to complete 5 more days per ID recommendations.  -Patient maintained on home regimen of oral Dilaudid pain regimen. -Patient seen in consultation by ID, Dr.Van Dam who is recommended continuation of vancomycin early on during the hospitalization and on day of discharge was transitioned to Zyvox for 5 more days of treatment per ID recommendations with outpatient follow-up set up with ID.   -Lower extremity ultrasound negative for DVT however shows a complex heterogeneous/hypoechoic structure in the area of the groin 3.2 x 2.5 x 1.7 cm.   -General surgery consulted and recommended continuation of IV antibiotics as recommended per ID with no further debridement or drainage of the wound at this time. -W OC RN assessed patient and recommendations made. -Patient will be discharged on 5 more days of Zyvox with  outpatient follow-up with ID and general surgery.  2.  Fall/generalized weakness -PT/OT assessed patient and recommended home health therapies..  3.  History of malignant melanoma -Currently on radiation/chemo with Dr. Marin Olp, Dr. Dwana Curd. -Dr. Marin Olp with oncology notified of admission via epic and follow the patient during  the hospitalization.   -Outpatient follow-up with oncology.   4.  Normocytic anemia/mild thrombocytopenia -No evidence of bleeding noted. -Hemoglobin remained stable at 11.6 by day of discharge.  5.  Anxiety -Patient maintained on home dose Prozac.  Once patient was transitioned to Zyvox he was recommended per ID to decrease dose to 20 mg daily until Zyvox is completed and subsequently resumed back to home regimen of 40 mg daily.   6.  Hypokalemia Magnesium at 2.7. -Potassium was repleted during the hospitalization and was 3.6 by day of discharge.   -Outpatient follow-up.   7.  Hyperbilirubinemia -Noted to be elevated since August. -Bilirubin trended down and hyperbilirubinemia resolved -Right upper quadrant ultrasound somewhat limited due to body habitus and lack of acoustic window otherwise unremarkable.  No biliary ductal dilatation identified. -Outpatient follow-up.  8.  Dehydration Was on IV fluids which have been kvo. -Outpatient follow-up.   9.  History of PE/DVT -Patient maintained on home regimen Eliquis.   10.  Dyspnea -Patient noted to have some dyspnea and noted to be tachypneic on 12/22/2020. -Patient placed on scheduled nebs. -Patient IV fluids with saline locked and patient given a dose of IV Lasix (12/22/2020)with a urine output of 1.7 L. -Patient on torsemide as needed prior to admission. -Patient placed on Lasix 40 mg daily with good urine output and significant clinical improvement.   -Diuretics were held.   -Home regimen torsemide will be resumed on discharge.   -Outpatient follow-up with PCP.    11.  Pressure injury, POA Pressure Injury 12/20/20 Buttocks Left;Proximal Stage 3 -  Full thickness tissue loss. Subcutaneous fat may be visible but bone, tendon or muscle are NOT exposed. (Active)  12/20/20 1800  Location: Buttocks  Location Orientation: Left;Proximal  Staging: Stage 3 -  Full thickness tissue loss. Subcutaneous fat may be visible but bone,  tendon or muscle are NOT exposed.  Wound Description (Comments):   Present on Admission: Yes        Procedures: CT head CT C-spine 12/20/2020 Chest x-ray 12/20/2020 Plain films of the right tib-fib 12/20/2020 Plain films of the pelvis 12/20/2020 Pain.  The right foot 12/20/2020 Abdominal ultrasound 12/20/2020 Lower extremity ultrasound/Dopplers 12/22/2020  Consultations: Oncology: Dr. Marin Olp 12/22/2020 ID: Dr. Tommy Medal 12/22/2020 General surgery: Dr. Redmond Pulling 12/22/2020  Discharge Exam: Vitals:   12/25/20 0521 12/25/20 1332  BP: (!) 153/91 (!) 130/100  Pulse: 72 66  Resp: 20 20  Temp: 98 F (36.7 C) 98.3 F (36.8 C)  SpO2: 92% 100%    General: NAD Cardiovascular: Regular rate rhythm no murmurs rubs or gallops.  No JVD.  No lower extremity edema. Respiratory: Lungs clear to auscultation bilaterally.  No wheezes no crackles, no rhonchi.  Discharge Instructions   Discharge Instructions     Diet - low sodium heart healthy   Complete by: As directed    Discharge wound care:   Complete by: As directed    As above   Increase activity slowly   Complete by: As directed       Allergies as of 12/25/2020       Reactions   Cefepime Hives, Shortness Of Breath   Cephaeline Hives   Morphine Swelling  A swollen stomach.   Morphine And Related Shortness Of Breath, Nausea And Vomiting, Swelling, Other (See Comments)   Agitation, tolerates dilaudid Other reaction(s): Other (See Comments) Agitation, tolerates dilaudid   Penicillins Hives, Shortness Of Breath, Rash   Has patient had a PCN reaction causing immediate rash, facial/tongue/throat swelling, SOB or lightheadedness with hypotension: Yes Has patient had a PCN reaction causing severe rash involving mucus membranes or skin necrosis: Yes Has patient had a PCN reaction that required hospitalization No Has patient had a PCN reaction occurring within the last 10 years: No If all of the above answers are "NO", then may proceed with  Cephalosporin use.   Doxycycline Rash   Oxycodone-acetaminophen Other (See Comments)   Patient doesn't remember what type of reaction.        Medication List     STOP taking these medications    sulfamethoxazole-trimethoprim 800-160 MG tablet Commonly known as: BACTRIM DS       TAKE these medications    albuterol 108 (90 Base) MCG/ACT inhaler Commonly known as: VENTOLIN HFA Inhale 1-2 puffs into the lungs every 4 (four) hours as needed for wheezing or shortness of breath.   ALPRAZolam 0.5 MG tablet Commonly known as: XANAX Take 1 tablet (0.5 mg total) by mouth 3 (three) times daily as needed for anxiety. for anxiety   apixaban 5 MG Tabs tablet Commonly known as: ELIQUIS Take 1 tablet (5 mg total) by mouth 2 (two) times daily.   B-6 PO Take 1 tablet by mouth daily.   FLUoxetine 20 MG capsule Commonly known as: PROZAC Take 1 capsule (20 mg total) by mouth daily for 5 days. Start taking on: December 26, 2020 What changed: You were already taking a medication with the same name, and this prescription was added. Make sure you understand how and when to take each.   FLUoxetine 40 MG capsule Commonly known as: PROZAC Take 1 capsule (40 mg total) by mouth daily. Start taking on: January 01, 2021 What changed: These instructions start on January 01, 2021. If you are unsure what to do until then, ask your doctor or other care provider.   fluticasone 50 MCG/ACT nasal spray Commonly known as: FLONASE INSTILL 2 SPRAYS IN EACH NOSTRIL EVERY DAY What changed:  how much to take how to take this when to take this reasons to take this additional instructions   gabapentin 300 MG capsule Commonly known as: NEURONTIN Take 2 capsules (600 mg total) by mouth at bedtime.   HYDROmorphone 2 MG tablet Commonly known as: DILAUDID Take 1-1.5 tablets (2-3 mg total) by mouth every 8 (eight) hours as needed for severe pain.   lidocaine-prilocaine cream Commonly known as:  EMLA Apply 1 application topically as needed.   linezolid 600 MG tablet Commonly known as: ZYVOX Take 1 tablet (600 mg total) by mouth every 12 (twelve) hours for 5 days.   Melatonin 10 MG Tabs Take 10 mg by mouth at bedtime.   metoprolol tartrate 25 MG tablet Commonly known as: LOPRESSOR TAKE 1 TABLET TWO TIMES DAILY. What changed: See the new instructions.   mupirocin ointment 2 % Commonly known as: BACTROBAN On leg wound w/dressing change qd or bid   pantoprazole 40 MG tablet Commonly known as: PROTONIX Take 1 tablet (40 mg total) by mouth every evening. What changed:  when to take this reasons to take this   polyvinyl alcohol 1.4 % ophthalmic solution Commonly known as: LIQUIFILM TEARS Place 1 drop into both eyes as needed  for dry eyes.   predniSONE 5 MG tablet Commonly known as: DELTASONE Take 15 mg by mouth daily.   temazepam 15 MG capsule Commonly known as: RESTORIL Take 1 capsule (15 mg total) by mouth at bedtime as needed for sleep.   torsemide 100 MG tablet Commonly known as: DEMADEX Take 0.5-1 tablets (50-100 mg total) by mouth daily as needed. What changed: reasons to take this   vitamin B-12 1000 MCG tablet Commonly known as: CYANOCOBALAMIN Take 1,000 mcg by mouth daily.   Vitamin D3 50 MCG (2000 UT) capsule Take 1 capsule (2,000 Units total) by mouth daily.               Discharge Care Instructions  (From admission, onward)           Start     Ordered   12/25/20 0000  Discharge wound care:       Comments: As above   12/25/20 1346           Allergies  Allergen Reactions   Cefepime Hives and Shortness Of Breath   Cephaeline Hives   Morphine Swelling    A swollen stomach.   Morphine And Related Shortness Of Breath, Nausea And Vomiting, Swelling and Other (See Comments)    Agitation, tolerates dilaudid Other reaction(s): Other (See Comments) Agitation, tolerates dilaudid   Penicillins Hives, Shortness Of Breath and Rash     Has patient had a PCN reaction causing immediate rash, facial/tongue/throat swelling, SOB or lightheadedness with hypotension: Yes Has patient had a PCN reaction causing severe rash involving mucus membranes or skin necrosis: Yes Has patient had a PCN reaction that required hospitalization No Has patient had a PCN reaction occurring within the last 10 years: No If all of the above answers are "NO", then may proceed with Cephalosporin use.    Doxycycline Rash   Oxycodone-Acetaminophen Other (See Comments)    Patient doesn't remember what type of reaction.    Follow-up Information     Stark Klein, MD Follow up on 01/08/2021.   Specialty: General Surgery Why: 11:30am arrive by 11:15am Contact information: [place] [address] [phone]         Tommy Medal, Lavell Islam, MD Follow up on 01/10/2021.   Specialty: Infectious Diseases Why: f/u at 245pm Contact information: [address] [place] [address] [phone]         Plotnikov, Evie Lacks, MD. Schedule an appointment as soon as possible for a visit in 2 week(s).   Specialty: Internal Medicine Contact information: [place] [address] [phone]         Josue Hector, MD .   Specialty: Cardiology Contact information: [phone] [place] [address] [phone]         [name], MD Follow up.   Specialty: Oncology Why: Follow-up as scheduled. Contact information: [place] [address] [phone]                  The results of significant diagnostics from this hospitalization (including imaging, microbiology, ancillary and laboratory) are listed below for reference.    Significant Diagnostic Studies: DG Chest 1 View  Result Date: 12/20/2020 CLINICAL DATA:  Found on floor. EXAM: CHEST  1 VIEW COMPARISON:  05/26/2020 FINDINGS: Chronic elevation of the right  hemidiaphragm. Left Port-A-Cath remains in place with the tip in the SVC. Mild cardiomegaly. No confluent airspace opacities  or effusions. Bilateral shoulder replacements. No acute bony abnormality. IMPRESSION: Chronic elevation of the right hemidiaphragm. No acute cardiopulmonary disease. Electronically Signed   By: Rolm Baptise M.D.   On: 12/20/2020 10:15   DG Pelvis 1-2 Views  Result Date: 12/20/2020 CLINICAL DATA:  Fall EXAM: PELVIS - 1-2 VIEW COMPARISON:  CT pelvis 07/20/2020 FINDINGS: There is no acute fracture or dislocation. Femoroacetabular alignment is normal. The SI joints and symphysis pubis are intact. There is mild degenerative change of both hips. Lower lumbar spinal fusion hardware is partially imaged. IMPRESSION: No acute fracture or dislocation. Electronically Signed   By: Valetta Mole M.D.   On: 12/20/2020 10:17   DG Elbow Complete Left  Result Date: 12/20/2020 CLINICAL DATA:  Fall with left elbow pain EXAM: LEFT ELBOW - COMPLETE 3+ VIEW COMPARISON:  None. FINDINGS: There is no evidence of fracture, dislocation, or joint effusion. Generalized osteopenia. IMPRESSION: No acute finding. Electronically Signed   By: Jorje Guild M.D.   On: 12/20/2020 10:15   DG Tibia/Fibula Right  Result Date: 12/20/2020 CLINICAL DATA:  Right leg pain after fall. EXAM: RIGHT TIBIA AND FIBULA - 2 VIEW COMPARISON:  None. FINDINGS: Status post right knee and ankle arthroplasty. No fracture or dislocation is noted. IMPRESSION: No acute abnormality seen. Electronically Signed   By: Marijo Conception M.D.   On: 12/20/2020 10:19   CT Head Wo Contrast  Result Date: 12/20/2020 CLINICAL DATA:  Head trauma, minor (Age >= 65y) Fall. EXAM: CT HEAD WITHOUT CONTRAST TECHNIQUE: Contiguous axial images were obtained from the base of the skull through the vertex without intravenous contrast. COMPARISON:  05/25/2020 FINDINGS: Brain: Chronic microvascular disease throughout the deep white matter, stable since prior study.  No acute intracranial abnormality. Specifically, no hemorrhage, hydrocephalus, mass lesion, acute infarction, or significant intracranial injury. Vascular: No hyperdense vessel or unexpected calcification. Skull: No acute calvarial abnormality. Sinuses/Orbits: No acute findings Other: None IMPRESSION: Similar chronic small vessel disease. No acute intracranial abnormality. Electronically Signed   By: Rolm Baptise M.D.   On: 12/20/2020 10:29   CT Cervical Spine Wo Contrast  Result Date: 12/20/2020 CLINICAL DATA:  Neck trauma (Age >= 65y).  Fall. EXAM: CT CERVICAL SPINE WITHOUT CONTRAST TECHNIQUE: Multidetector CT imaging of the cervical spine was performed without intravenous contrast. Multiplanar CT image reconstructions were also generated. COMPARISON:  06/04/2016 FINDINGS: Alignment: Degenerative anterolisthesis of C3 on C4, C4 on C5 and C7 on T1, stable since prior study. Skull base and vertebrae: No acute fracture. No primary bone lesion or focal pathologic process. Soft tissues and spinal canal: No prevertebral fluid or swelling. No visible canal hematoma. Disc levels: Diffuse advanced degenerative disc disease and facet disease. Upper chest: No acute findings Other: None IMPRESSION: Diffuse advanced degenerative disc disease and facet disease. Multilevel degenerative anterolisthesis, stable since prior study. No acute bony abnormality. Electronically Signed   By: Rolm Baptise M.D.   On: 12/20/2020 10:32   DG CHEST PORT 1 VIEW  Result Date: 12/22/2020 CLINICAL DATA:  Tachypnea EXAM: PORTABLE CHEST 1 VIEW COMPARISON:  12/20/2020 FINDINGS: Bilateral shoulder arthroplasty. Left Port-A-Cath tip at low SVC. Midline trachea. Cardiomegaly accentuated by AP portable technique. Atherosclerosis in the transverse aorta. Possible small left pleural effusion. Skin fold over the right hemithorax. No pneumothorax. Left greater than right base airspace disease. Marked right hemidiaphragm elevation. IMPRESSION: Possible  small left pleural effusion. Otherwise, similar appearance of bibasilar Airspace disease, likely atelectasis. Cardiomegaly with low lung volumes.  No overt congestive failure. Aortic  Atherosclerosis (ICD10-I70.0). Electronically Signed   By: Abigail Miyamoto M.D.   On: 12/22/2020 10:55   DG Foot 2 Views Right  Result Date: 12/20/2020 CLINICAL DATA:  Right foot pain after fall. EXAM: RIGHT FOOT - 2 VIEW COMPARISON:  January 29, 2013. FINDINGS: Status post arthroplasty of talotibial joint. Status post surgical fusion of the first and second tarsometatarsal joints. Probable arthroplasty involving the third tarsometatarsal joint. Stable chronic resorption is seen involving the distal portions of the third, fourth and fifth metatarsals. No acute fracture or dislocation is noted. No soft tissue abnormality is noted. IMPRESSION: Chronic postsurgical and degenerative changes are noted as described above. No definite acute abnormality is seen. Electronically Signed   By: Marijo Conception M.D.   On: 12/20/2020 10:18   MYOCARDIAL PERFUSION IMAGING  Result Date: 12/18/2020   The study is normal. The study is low risk.   No ST deviation was noted.   Left ventricular function is normal. Nuclear stress EF: 80 %. The left ventricular ejection fraction is hyperdynamic (>65%).   Prior study available for comparison from 05/20/2014. Normal stress nuclear study with no ischemia or infarction.  Gated ejection fraction 80% with normal wall motion.   [place] LT LOWER EXTREM LTD SOFT TISSUE NON VASCULAR  Result Date: 12/22/2020 CLINICAL DATA:  Open thigh wound.  Rule out abscess. EXAM: ULTRASOUND LEFT LOWER EXTREMITY LIMITED TECHNIQUE: Ultrasound examination of the lower extremity soft tissues was performed in the area of clinical concern. COMPARISON:  None. FINDINGS: In the area of clinical concern within the left thigh, a superficial avascular heterogeneously hypoechoic lesion measures 2.3 x 1.4 x 2.1 cm. No vascularity within.  Positioned just superficial to a large vessel, likely the femoral artery. No communication identified. IMPRESSION: Complex collection within the superficial thigh, suggesting phlegmon, chronic hematuria or early abscess. Please note that this is immediately superficial to a large vessel, presumably the femoral artery. Electronically Signed   By: Abigail Miyamoto M.D.   On: 12/22/2020 15:13   VAS [place] LOWER EXTREMITY VENOUS (DVT)  Result Date: 12/22/2020  Lower Venous DVT Study Patient Name:  [name]  Date of Exam:   12/22/2020 Medical Rec #: [id number]           Accession #:    [id number] Date of Birth: February 05, 1946           Patient Gender: M Patient Age:   82 years Exam Location:  [facility] Procedure:      VAS [place] LOWER EXTREMITY VENOUS (DVT) Referring Phys: Claiborne Billings OSBORNE --------------------------------------------------------------------------------  Indications: Pain, Swelling, and Erythema. Other Indications: LLE melanoma and lymph node excision in 08/2020 and per                    patient has never healed. Risk Factors: Chemotherapy HX of PE 05/2019 Cancer HX of melanoma DVT HX 05/2019. Anticoagulation: Eliquis - for AFIB. Limitations: Body habitus and poor ultrasound/tissue interface. Comparison Study: Previous exam 08/03/2020 - negative for DVT. Performing Technologist: Rogelia Rohrer RVT, RDMS  Examination Guidelines: A complete evaluation includes B-mode imaging, spectral Doppler, color Doppler, and power Doppler as needed of all accessible portions of each vessel. Bilateral testing is considered an integral part of a complete examination. Limited examinations for reoccurring indications may be performed as noted. The reflux portion of the exam is performed with the patient in reverse Trendelenburg.  +-----+---------------+---------+-----------+----------+--------------+ RIGHTCompressibilityPhasicitySpontaneityPropertiesThrombus Aging  +-----+---------------+---------+-----------+----------+--------------+ CFV  Full           Yes  Yes                                 +-----+---------------+---------+-----------+----------+--------------+   +---------+---------------+---------+-----------+----------+-------------------+ LEFT     CompressibilityPhasicitySpontaneityPropertiesThrombus Aging      +---------+---------------+---------+-----------+----------+-------------------+ CFV      Full           Yes      Yes                                      +---------+---------------+---------+-----------+----------+-------------------+ SFJ      Full                                                             +---------+---------------+---------+-----------+----------+-------------------+ FV Prox  Full           Yes      Yes                                      +---------+---------------+---------+-----------+----------+-------------------+ FV Mid   Full           Yes      Yes                                      +---------+---------------+---------+-----------+----------+-------------------+ FV DistalFull           Yes      Yes                                      +---------+---------------+---------+-----------+----------+-------------------+ PFV      Full                                                             +---------+---------------+---------+-----------+----------+-------------------+ POP      Full           Yes      Yes                                      +---------+---------------+---------+-----------+----------+-------------------+ PTV                                                   Not well visualized +---------+---------------+---------+-----------+----------+-------------------+ PERO                                                  Not well visualized +---------+---------------+---------+-----------+----------+-------------------+ Poor visualization of PTV and  PeroV due to excesive edema.   Summary: RIGHT: - No evidence of common femoral vein obstruction.  LEFT: - Portions of this examination were limited- see technologist comments above. - There is no evidence of deep vein thrombosis in the lower extremity. - There is no evidence of superficial venous thrombosis.  Compex heterogenous/ hypoechoic structure seen in area of groin (3.2 x 2.5 x 1.7 cm).  *See table(s) above for measurements and observations.    Preliminary    US Abdomen Limited RUQ (LIVER/GB)  Result Date: 12/20/2020 CLINICAL DATA:  Hyperbilirubinemia EXAM: ULTRASOUND ABDOMEN LIMITED RIGHT UPPER QUADRANT COMPARISON:  CT abdomen/pelvis 10/14/2017, abdominal ultrasound 12/09/2006 FINDINGS: Gallbladder: No gallstones or wall thickening visualized. No sonographic Murphy sign noted by sonographer. Common bile duct: Diameter: 5 mm Liver: Evaluation of the liver is somewhat limited due to body habitus and lack of an acoustic window. Within these confines, no focal lesion identified. Within normal limits in parenchymal echogenicity. Portal vein is patent on color Doppler imaging with normal direction of blood flow towards the liver. Other: None. IMPRESSION: Evaluation of the liver is somewhat limited due to body habitus and lack of an acoustic window. Within these confines, unremarkable right upper quadrant ultrasound. No biliary ductal dilatation identified. Electronically Signed   By: Valetta Mole M.D.   On: 12/20/2020 15:56    Microbiology: Recent Results (from the past 240 hour(s))  Culture, blood (routine x 2)     Status: None   Collection Time: 12/20/20  9:21 AM   Specimen: BLOOD  Result Value Ref Range Status   Specimen Description   Final    BLOOD PORTA CATH Performed at [facility] [address] [place], [place] [address]    Special Requests   Final    BOTTLES DRAWN AEROBIC AND ANAEROBIC Blood Culture adequate volume Performed at [facility] [address], [place], [place] [address]    Culture   Final    NO GROWTH 5 DAYS Performed at [facility], [place] [address], [place], [place] [address]    Report Status 12/25/2020 FINAL  Final  Resp Panel by RT-PCR (Flu A&B, Covid) Nasopharyngeal Swab     Status: None   Collection Time: 12/20/20  9:22 AM   Specimen: Nasopharyngeal Swab; Nasopharyngeal(NP) swabs in vial transport medium  Result Value Ref Range Status   SARS Coronavirus 2 by RT PCR NEGATIVE NEGATIVE Final    Comment: (NOTE) SARS-CoV-2 target nucleic acids are NOT DETECTED.  The SARS-CoV-2 RNA is generally detectable in upper respiratory specimens during the acute phase of infection. The lowest concentration of SARS-CoV-2 viral copies this assay can detect is 138 copies/mL. A negative result does not preclude SARS-Cov-2 infection and should not be used as the sole basis for treatment or other patient management decisions. A negative result may occur with  improper specimen collection/handling, submission of specimen other than nasopharyngeal swab, presence of viral mutation(s) within the areas targeted by this assay, and inadequate number of viral copies(<138 copies/mL). A negative result must be combined with clinical observations, patient history, and epidemiological information. The expected result is Negative.  Fact Sheet for Patients:  EntrepreneurPulse.com.au  Fact Sheet for Healthcare Providers:  IncredibleEmployment.be  This test is no t yet approved or cleared by the [place] FDA and  has been authorized for detection and/or diagnosis of SARS-CoV-2 by FDA under an Emergency Use Authorization (EUA). This EUA will remain  in effect (meaning this test can be used) for  the duration of the COVID-19 declaration under Section 564(b)(1) of the Act, 21 U.S.C.section 360bbb-3(b)(1), unless the authorization is terminated  or revoked sooner.       Influenza A by PCR  NEGATIVE NEGATIVE Final   Influenza B by PCR NEGATIVE NEGATIVE Final    Comment: (NOTE) The Xpert Xpress SARS-CoV-2/FLU/RSV plus assay is intended as an aid in the diagnosis of influenza from Nasopharyngeal swab specimens and should not be used as a sole basis for treatment. Nasal washings and aspirates are unacceptable for Xpert Xpress SARS-CoV-2/FLU/RSV testing.  Fact Sheet for Patients: EntrepreneurPulse.com.au  Fact Sheet for Healthcare Providers: IncredibleEmployment.be  This test is not yet approved or cleared by the [place] FDA and has been authorized for detection and/or diagnosis of SARS-CoV-2 by FDA under an Emergency Use Authorization (EUA). This EUA will remain in effect (meaning this test can be used) for the duration of the COVID-19 declaration under Section 564(b)(1) of the Act, 21 U.S.C. section 360bbb-3(b)(1), unless the authorization is terminated or revoked.  Performed at [facility], [place] [address], [place], [place] [address]   Culture, blood (routine x 2)     Status: None   Collection Time: 12/20/20  9:26 AM   Specimen: BLOOD  Result Value Ref Range Status   Specimen Description   Final    BLOOD LEFT ANTECUBITAL Performed at [facility] [address] [place], [place] [address]    Special Requests   Final    BOTTLES DRAWN AEROBIC ONLY Blood Culture adequate volume Performed at [facility] [address], [place], [place] [address]    Culture   Final    NO GROWTH 5 DAYS Performed at [facility], [place] [address], [place], [place] [address]    Report Status 12/25/2020 FINAL  Final     Labs: Basic Metabolic Panel: Recent Labs  Lab 12/20/20 0911 12/20/20 1939 12/21/20 0928 12/22/20 0406 12/23/20 0338 12/24/20 0850 12/25/20 1145  NA 142   < > 141 138 136 139 138  K 2.4*   < > 3.4* 4.3 4.3 4.0 3.6  CL 95*   < > 104 105 102 107 100  CO2  37*   < > 32 31 30 27 29   GLUCOSE 99   < > 232* 99 84 70 97  BUN 30*   < > 32* 26* 19 16 16   CREATININE 0.98   < > 0.96 0.78 0.68 0.68 0.67  CALCIUM 8.4*   < > 7.9* 7.7* 7.8* 7.8* 8.1*  MG 2.7*  --  2.6*  --   --   --  1.9  PHOS  --   --   --   --   --   --  3.4   < > = values in this interval not displayed.   Liver Function Tests: Recent Labs  Lab 12/20/20 0911 12/20/20 1939 12/21/20 0928 12/25/20 1145  AST 26 26 40  --   ALT 19 19 26   --   ALKPHOS 47 45 44  --   BILITOT 2.0* 1.5* 0.9  --   PROT 5.6* 5.3* 5.5*  --   ALBUMIN 2.9* 2.9* 2.7* 2.9*   No results for input(s): LIPASE, AMYLASE in the last 168 hours. No results for input(s): AMMONIA in the last 168 hours. CBC: Recent Labs  Lab 12/20/20 0911 12/20/20 1939 12/21/20 0928 12/22/20 0406 12/23/20 0338 12/24/20 0657 12/25/20 1145  WBC 16.1*   < > 11.6* 10.7*  7.3 9.4 8.3  NEUTROABS 14.3*  --  10.1* 9.4*  --  7.0 5.8  HGB 11.4*   < > 10.8* 10.4* 10.0* 12.1* 11.6*  HCT 37.0*   < > 36.2* 34.4* 32.9* 37.9* 37.1*  MCV 97.6   < > 102.0* 100.9* 99.7 97.4 96.1  PLT 131*   < > 126* 136* 143* 166 192   < > = values in this interval not displayed.   Cardiac Enzymes: No results for input(s): CKTOTAL, CKMB, CKMBINDEX, TROPONINI in the last 168 hours. BNP: BNP (last 3 results) No results for input(s): BNP in the last 8760 hours.  ProBNP (last 3 results) No results for input(s): PROBNP in the last 8760 hours.  CBG: No results for input(s): GLUCAP in the last 168 hours.     Signed:  Irine Seal MD.  [facility] 12/25/2020, 3:12 PM

## 2020-12-25 NOTE — Care Management Important Message (Signed)
Important Message  Patient Details IM Letter given to the Patient. Name: [name] MRN: [id number] Date of Birth: [birth year]   Medicare Important Message Given:  Yes     Kerin Salen 12/25/2020, 12:16 PM

## 2020-12-26 ENCOUNTER — Ambulatory Visit: Payer: Medicare Other | Admitting: Rehabilitation

## 2020-12-26 ENCOUNTER — Telehealth: Payer: Self-pay

## 2020-12-26 MED ORDER — TORSEMIDE 20 MG PO TABS
20.0000 mg | ORAL_TABLET | Freq: Every day | ORAL | Status: DC
  Administered 2020-12-27: 20 mg via ORAL
  Filled 2020-12-26: qty 1

## 2020-12-26 MED ORDER — ONDANSETRON HCL 4 MG/2ML IJ SOLN
4.0000 mg | Freq: Four times a day (QID) | INTRAMUSCULAR | Status: DC | PRN
  Administered 2020-12-26: 4 mg via INTRAVENOUS
  Filled 2020-12-26: qty 2

## 2020-12-26 NOTE — Telephone Encounter (Signed)
Pt was taking toresimide and now pt states that the hospital has him taking Furosemide. The medication they have switched him has him very shaky he cant stop use the urinal. Request a call back [phone]

## 2020-12-26 NOTE — Progress Notes (Signed)
Physical Therapy Treatment Patient Details Name: [name] MRN: [id number] DOB: 1945-11-07 Today's Date: 12/26/2020   History of Present Illness Patient 75 year old gentleman history of malignant melanoma L thigh, paroxysmal A. fib, RA, COPD, HTN,hemochromatosis, chronic back pain, anxiety presented  12/20/20 after a fall, down several hours.  Patient denies any LOC and remembers tripping over a rug.  CT head CT C-spine done was negative.  Patient noted to be hypokalemic elevated also concern for left groin cellulitis with a leukocytosis and fever. .    PT Comments    Pt tolerated an increased ambulation distance of 50' x 2 with RW. SaO2 95% on room air walking. Pt fatigues quickly and is mildly unsteady. Supervision for mobility recommended. He requires physical assistance for bed mobility. His wife is unable to provide this, so ST-SNF is recommended.     Recommendations for follow up therapy are one component of a multi-disciplinary discharge planning process, led by the attending physician.  Recommendations may be updated based on patient status, additional functional criteria and insurance authorization.  Follow Up Recommendations  Supervision for mobility/OOB;SNF     Equipment Recommendations  None recommended by PT    Recommendations for Other Services       Precautions / Restrictions Precautions Precautions: Fall Precaution Comments: abrasions, pain in inner left thigh Restrictions Weight Bearing Restrictions: No     Mobility  Bed Mobility Overal bed mobility: Needs Assistance Bed Mobility: Supine to Sit     Supine to sit: Min assist     General bed mobility comments: increased time, used bedrail, min A to raise trunk    Transfers Overall transfer level: Needs assistance Equipment used: Rolling walker (2 wheeled) Transfers: Sit to/from Stand Sit to Stand: Supervision         General transfer comment: VCs hand  placement  Ambulation/Gait Ambulation/Gait assistance: Supervision Gait Distance (Feet): 50 Feet (50' x 2 with standing rest break) Assistive device: Rolling walker (2 wheeled) Gait Pattern/deviations: Step-to pattern;Step-through pattern;Decreased stride length;Trunk flexed Gait velocity: decr   General Gait Details: cues for posture, step length, no overt LOB, SaO2 95% on room air walking, 2/4 dyspnea which pt reports is baseline 2* COPD   Stairs             Wheelchair Mobility    Modified Rankin (Stroke Patients Only)       Balance Overall balance assessment: Needs assistance   Sitting balance-Leahy Scale: Fair     Standing balance support: During functional activity;Bilateral upper extremity supported Standing balance-Leahy Scale: Poor Standing balance comment: reliant on UEs                            Cognition Arousal/Alertness: Awake/alert Behavior During Therapy: WFL for tasks assessed/performed Overall Cognitive Status: Within Functional Limits for tasks assessed                                        Exercises      General Comments        Pertinent Vitals/Pain Pain Score: 5  Pain Location: back Pain Descriptors / Indicators: Aching Pain Intervention(s): Limited activity within patient's tolerance;Monitored during session;Repositioned (declined pain medication)    Home Living                      Prior Function  PT Goals (current goals can now be found in the care plan section) Acute Rehab PT Goals Patient Stated Goal: walk farther PT Goal Formulation: With patient Time For Goal Achievement: 01/04/21 Potential to Achieve Goals: Good Progress towards PT goals: Progressing toward goals    Frequency    Min 2X/week      PT Plan Discharge plan needs to be updated    Co-evaluation              AM-PAC PT "6 Clicks" Mobility   Outcome Measure  Help needed turning from your back  to your side while in a flat bed without using bedrails?: A Little Help needed moving from lying on your back to sitting on the side of a flat bed without using bedrails?: A Little Help needed moving to and from a bed to a chair (including a wheelchair)?: A Little Help needed standing up from a chair using your arms (e.g., wheelchair or bedside chair)?: A Little Help needed to walk in hospital room?: A Little Help needed climbing 3-5 steps with a railing? : A Lot 6 Click Score: 17    End of Session Equipment Utilized During Treatment: Gait belt Activity Tolerance: Patient tolerated treatment well Patient left: in chair;with call bell/phone within reach;with chair alarm set Nurse Communication: Mobility status PT Visit Diagnosis: Unsteadiness on feet (R26.81);History of falling (Z91.81);Difficulty in walking, not elsewhere classified (R26.2)     Time: 7530-0511 PT Time Calculation (min) (ACUTE ONLY): 26 min  Charges:  $Gait Training: 8-22 mins $Therapeutic Activity: 8-22 mins                     Blondell Reveal Kistler PT 12/26/2020  Acute Rehabilitation Services Pager [phone] Office [phone]

## 2020-12-26 NOTE — Progress Notes (Signed)
Pt alert and oriented x4. Fall precautions maintained. Call bell within reach. Bed is in the lowest position. High anxiety. No acute events overnight.

## 2020-12-26 NOTE — Progress Notes (Signed)
Occupational Therapy Progress Note  Patient wanting to perform oral care for OT session. Overall min G with rolling walker for safety with ambulation/transfers due to mild unsteadiness. Can tolerate static standing at sink as well as at toilet without upper extremity support while performing ADLs. Does endorse fatigue after ambulating with PT earlier in AM "I think I'm going to nap" in recliner chair. Patient reports spouse able to provide limited assistance and would like to go to rehab for further strengthening and endurance, D/C recommendations updated.     12/26/20 1300  OT Visit Information  Last OT Received On 12/26/20  Assistance Needed +1  History of Present Illness Patient 75 year old gentleman history of malignant melanoma L thigh, paroxysmal A. fib, RA, COPD, HTN,hemochromatosis, chronic back pain, anxiety presented  12/20/20 after a fall, down several hours.  Patient denies any LOC and remembers tripping over a rug.  CT head CT C-spine done was negative.  Patient noted to be hypokalemic elevated also concern for left groin cellulitis with a leukocytosis and fever. .  Precautions  Precautions Fall  Precaution Comments abrasions, pain in inner left thigh  Pain Assessment  Pain Assessment Faces  Faces Pain Scale 4  Pain Location buttock  Pain Descriptors / Indicators Sore  Pain Intervention(s) Other (comment) (notified RN)  Cognition  Arousal/Alertness Awake/alert  Behavior During Therapy WFL for tasks assessed/performed  Overall Cognitive Status Within Functional Limits for tasks assessed  ADL  Overall ADL's  Needs assistance/impaired  Grooming Oral care;Wash/dry hands;Supervision/safety;Standing  Armed forces technical officer Min guard;Ambulation;RW  Toilet Transfer Details (indicate cue type and reason) instruct patient to push walker over toilet frame in order to stand and urinate, min G for safety due to mildly unsteady  ToiletingRunner, broadcasting/film/video and Hygiene Min guard;Sit to/from  stand  Toileting - Clothing Manipulation Details (indicate cue type and reason) to wash peri area after voiding  Functional mobility during ADLs Min guard;Rolling walker  General ADL Comments reprots feeling fatigued after ambulating with PT and then performing ADLs in bathroom, overall min G assist with walker for functional ambulation/transfers  Bed Mobility  General bed mobility comments in chair  Balance  Overall balance assessment Needs assistance;History of Falls  Sitting-balance support Feet supported  Sitting balance-Leahy Scale Fair  Standing balance support No upper extremity supported  Standing balance-Leahy Scale Fair  Standing balance comment can static stand at toilet without UE support  Transfers  Overall transfer level Needs assistance  Equipment used Rolling walker (2 wheeled)  Transfers Sit to/from Stand  Sit to Stand Min guard  General transfer comment min G for safety as patient is mildly unsteady  OT - End of Session  Equipment Utilized During Treatment Rolling walker  Activity Tolerance Patient tolerated treatment well  Patient left in chair;with call bell/phone within reach;with chair alarm set  Nurse Communication Mobility status;Other (comment) (pt reporting sore spot on bottom not covered by patch)  OT Assessment/Plan  OT Plan Discharge plan needs to be updated  OT Visit Diagnosis Unsteadiness on feet (R26.81);Pain;Muscle weakness (generalized) (M62.81)  OT Frequency (ACUTE ONLY) Min 2X/week  Follow Up Recommendations SNF  OT Equipment None recommended by OT  AM-PAC OT "6 Clicks" Daily Activity Outcome Measure (Version 2)  Help from another person eating meals? 4  Help from another person taking care of personal grooming? 3  Help from another person toileting, which includes using toliet, bedpan, or urinal? 3  Help from another person bathing (including washing, rinsing, drying)? 2  Help from another person  to put on and taking off regular upper body  clothing? 3  Help from another person to put on and taking off regular lower body clothing? 2  6 Click Score 17  Progressive Mobility  What is the highest level of mobility based on the progressive mobility assessment? Level 4 (Walks with assist in room) - Balance while marching in place and cannot step forward and back - Complete  Mobility Out of bed to chair with meals;Ambulated with assistance in room  OT Goal Progression  Progress towards OT goals Progressing toward goals  Acute Rehab OT Goals  Patient Stated Goal walk farther  OT Goal Formulation With patient  Time For Goal Achievement 01/04/21  Potential to Achieve Goals Good  ADL Goals  Pt Will Perform Lower Body Dressing with adaptive equipment;with supervision;sit to/from stand  Pt Will Transfer to Toilet with supervision;regular height toilet;grab bars;ambulating  Pt Will Perform Toileting - Clothing Manipulation and hygiene with supervision;sit to/from stand  Additional ADL Goal #1 Patient will stand at sink to perform grooming task as evidence of improving activity tolerance  OT Time Calculation  OT Start Time (ACUTE ONLY) 1013  OT Stop Time (ACUTE ONLY) 1036  OT Time Calculation (min) 23 min  OT General Charges  $OT Visit 1 Visit  OT Treatments  $Self Care/Home Management  23-37 mins   [name] OT OT pager: [phone]

## 2020-12-26 NOTE — Progress Notes (Signed)
Patient was to be discharged yesterday 12/25/2020 however per [facility] facility unable to take patient today and not able to take patient tomorrow. Patient currently stable.  We will resume patient's home regimen diuretics.  No charge.

## 2020-12-27 ENCOUNTER — Inpatient Hospital Stay: Payer: Medicare Other

## 2020-12-27 ENCOUNTER — Ambulatory Visit: Payer: Medicare Other | Admitting: Hematology & Oncology

## 2020-12-27 DIAGNOSIS — Z20822 Contact with and (suspected) exposure to covid-19: Secondary | ICD-10-CM | POA: Diagnosis not present

## 2020-12-27 DIAGNOSIS — J449 Chronic obstructive pulmonary disease, unspecified: Secondary | ICD-10-CM | POA: Diagnosis not present

## 2020-12-27 DIAGNOSIS — M069 Rheumatoid arthritis, unspecified: Secondary | ICD-10-CM | POA: Diagnosis not present

## 2020-12-27 DIAGNOSIS — R531 Weakness: Secondary | ICD-10-CM | POA: Diagnosis not present

## 2020-12-27 DIAGNOSIS — E43 Unspecified severe protein-calorie malnutrition: Secondary | ICD-10-CM | POA: Diagnosis not present

## 2020-12-27 DIAGNOSIS — R41841 Cognitive communication deficit: Secondary | ICD-10-CM | POA: Diagnosis not present

## 2020-12-27 DIAGNOSIS — S8011XD Contusion of right lower leg, subsequent encounter: Secondary | ICD-10-CM | POA: Diagnosis not present

## 2020-12-27 DIAGNOSIS — I10 Essential (primary) hypertension: Secondary | ICD-10-CM | POA: Diagnosis not present

## 2020-12-27 DIAGNOSIS — I69828 Other speech and language deficits following other cerebrovascular disease: Secondary | ICD-10-CM | POA: Diagnosis not present

## 2020-12-27 DIAGNOSIS — Z743 Need for continuous supervision: Secondary | ICD-10-CM | POA: Diagnosis not present

## 2020-12-27 DIAGNOSIS — S71102D Unspecified open wound, left thigh, subsequent encounter: Secondary | ICD-10-CM | POA: Diagnosis not present

## 2020-12-27 DIAGNOSIS — B9689 Other specified bacterial agents as the cause of diseases classified elsewhere: Secondary | ICD-10-CM | POA: Diagnosis not present

## 2020-12-27 DIAGNOSIS — R410 Disorientation, unspecified: Secondary | ICD-10-CM | POA: Diagnosis not present

## 2020-12-27 DIAGNOSIS — L03314 Cellulitis of groin: Secondary | ICD-10-CM | POA: Diagnosis not present

## 2020-12-27 DIAGNOSIS — M6281 Muscle weakness (generalized): Secondary | ICD-10-CM | POA: Diagnosis not present

## 2020-12-27 DIAGNOSIS — D696 Thrombocytopenia, unspecified: Secondary | ICD-10-CM | POA: Diagnosis not present

## 2020-12-27 DIAGNOSIS — D649 Anemia, unspecified: Secondary | ICD-10-CM | POA: Diagnosis not present

## 2020-12-27 DIAGNOSIS — R2681 Unsteadiness on feet: Secondary | ICD-10-CM | POA: Diagnosis not present

## 2020-12-27 DIAGNOSIS — Z9181 History of falling: Secondary | ICD-10-CM | POA: Diagnosis not present

## 2020-12-27 DIAGNOSIS — I509 Heart failure, unspecified: Secondary | ICD-10-CM | POA: Diagnosis not present

## 2020-12-27 DIAGNOSIS — L03116 Cellulitis of left lower limb: Secondary | ICD-10-CM | POA: Diagnosis not present

## 2020-12-27 DIAGNOSIS — I480 Paroxysmal atrial fibrillation: Secondary | ICD-10-CM | POA: Diagnosis not present

## 2020-12-27 DIAGNOSIS — L89153 Pressure ulcer of sacral region, stage 3: Secondary | ICD-10-CM | POA: Diagnosis not present

## 2020-12-27 DIAGNOSIS — R5381 Other malaise: Secondary | ICD-10-CM | POA: Diagnosis not present

## 2020-12-27 DIAGNOSIS — C439 Malignant melanoma of skin, unspecified: Secondary | ICD-10-CM | POA: Diagnosis not present

## 2020-12-27 LAB — CBC
HCT: 37.1 % — ABNORMAL LOW (ref 39.0–52.0)
Hemoglobin: 11.7 g/dL — ABNORMAL LOW (ref 13.0–17.0)
MCH: 30.3 pg (ref 26.0–34.0)
MCHC: 31.5 g/dL (ref 30.0–36.0)
MCV: 96.1 fL (ref 80.0–100.0)
Platelets: 246 10*3/uL (ref 150–400)
RBC: 3.86 MIL/uL — ABNORMAL LOW (ref 4.22–5.81)
RDW: 13.2 % (ref 11.5–15.5)
WBC: 7.9 10*3/uL (ref 4.0–10.5)
nRBC: 0 % (ref 0.0–0.2)

## 2020-12-27 LAB — BASIC METABOLIC PANEL
Anion gap: 6 (ref 5–15)
BUN: 14 mg/dL (ref 8–23)
CO2: 33 mmol/L — ABNORMAL HIGH (ref 22–32)
Calcium: 8.3 mg/dL — ABNORMAL LOW (ref 8.9–10.3)
Chloride: 96 mmol/L — ABNORMAL LOW (ref 98–111)
Creatinine, Ser: 0.74 mg/dL (ref 0.61–1.24)
GFR, Estimated: >60 mL/min (ref >60)
Glucose, Bld: 87 mg/dL (ref 70–99)
Potassium: 3.8 mmol/L (ref 3.5–5.1)
Sodium: 135 mmol/L (ref 135–145)

## 2020-12-27 LAB — MAGNESIUM: Magnesium: 1.9 mg/dL (ref 1.7–2.4)

## 2020-12-27 MED ORDER — HEPARIN SOD (PORK) LOCK FLUSH 100 UNIT/ML IV SOLN
500.0000 [IU] | INTRAVENOUS | Status: AC | PRN
  Administered 2020-12-27: 500 [IU]

## 2020-12-27 NOTE — TOC Transition Note (Addendum)
Transition of Care [facility]) - CM/SW Discharge Note   Patient Details  Name: [name] MRN: [id number] Date of Birth: 01/13/1946  [facility] CM/SW Contact:  Trish Mage, LCSW Phone Number: 12/27/2020, 9:52 AM   Clinical Narrative:   Patient who is stable for d/c will transfer to [facility].  Family alerted.  PTAR arranged.  Nursing, please call report to [phone], room 501. TOC sign off.    Final next level of care: Skilled Nursing Facility Barriers to Discharge: Barriers Resolved   Patient Goals and CMS Choice Patient states their goals for this hospitalization and ongoing recovery are:: Get back to independence   Choice offered to / list presented to : Patient, Spouse  Discharge Placement                       Discharge Plan and Services   Discharge Planning Services: CM Consult Post Acute Care Choice: [place]                               Social Determinants of Health (SDOH) Interventions     Readmission Risk Interventions No flowsheet data found.

## 2020-12-27 NOTE — Discharge Summary (Signed)
Physician Discharge Summary  [name] [facility]:[id number] DOB: 19-Aug-1945 DOA: 12/20/2020  PCP: Cassandria Anger, MD  Admit date: 12/20/2020 Discharge date: 12/27/2020 Discharge disposition: SNF  PT/OT now recommending SNF.  He will be discharged to SNF once bed is available.  Time spent: 40 minutes  Recommendations for Outpatient Follow-up:  Follow-up with Dr. Barry Dienes, general surgery on 01/08/2021 at 11:15 AM. Follow-up with Dr. Tommy Medal, ID on 01/10/2021 at 2:45 PM. Follow-up with Plotnikov, Evie Lacks, MD in 2 weeks.  On follow-up patient need a basic metabolic profile and magnesium level checked to follow-up on electrolytes and renal function.  Cellulitis will need to be followed up upon. Follow-up with Dr. Marin Olp as scheduled.   Discharge Condition: Stable and improved  Diet recommendation: Heart healthy   History of present illness:  HPI per Dr. Tonette Lederer is a 75 y.o. male with medical history significant of malignant melanoma, pAfib on eliquis, anxiety. Presented after fall. He reports that he's felt week over the last couple of days. He had a near fall two nights ago, but was able to catch himself. Unfortunately, last night he was unable to catch himself. He states last night he was returning to his bed, when he tripped over a rug. He fell "straight down" on his butt and rolled to his back. He was unable to get up. He remembers the entire fall. There was no LOC. He did hit the back of his head. He was unable to get help for a few hours per his report. When he was found this morning, EMS was called for help. He denies any other aggravating or alleviating factors.    ED Course: CTH, c-spine negative for acute changes. K+ was 2.4. He was given 10 mEq of K+ IV. It was noted that he had left groin cellulitis, an elevated WBC and an elevated temp. He was started on vanc. TR[facility] was called for admission.   Hospital course/discharge diagnoses #1 left upper thigh/groin  cellulitis surrounding chronic wound/right shin hematoma/SIRS -Patient noted to have a chronic wound after lymph node biopsy 08/2020 and postradiation. -Patient admitted with concerns for SIRS with a fever, leukocytosis, concern for left groin/left upper thigh cellulitis. -Erythema improving. -Wound culture noted to have grown Citrobacter on 8/172022 and received Bactrim in outpatient setting. -Blood cultures with no growth to date. -Leukocytosis trended down and had resolved by day of discharge.. -Patient maintained on IV vancomycin during the hospitalization and subsequently transition to oral Zyvox on day of discharge to complete 5 more days per ID recommendations.  -Patient maintained on home regimen of oral Dilaudid pain regimen. -Patient seen in consultation by ID, Dr.Van Dam who is recommended continuation of vancomycin early on during the hospitalization and on day of discharge was transitioned to Zyvox for 5 more days of treatment per ID recommendations with outpatient follow-up set up with ID.   -Lower extremity ultrasound negative for DVT however shows a complex heterogeneous/hypoechoic structure in the area of the groin 3.2 x 2.5 x 1.7 cm.   -General surgery consulted and recommended continuation of IV antibiotics as recommended per ID with no further debridement or drainage of the wound at this time. -W OC RN assessed patient and recommendations made. -Patient will be discharged on 5 more days of Zyvox with outpatient follow-up with ID and general surgery.  2.  Fall/generalized weakness -PT/OT currently recommending SNF.  3.  History of malignant melanoma -Currently on radiation/chemo with Dr. Marin Olp, Dr. Dwana Curd. -Dr. Marin Olp  with oncology notified of admission via epic and follow the patient during the hospitalization.   -Outpatient follow-up with oncology.   4.  Normocytic anemia/mild thrombocytopenia -No evidence of bleeding noted. -Hemoglobin currently stable.  5.   Anxiety -Patient maintained on home dose Prozac.  Once patient was transitioned to Zyvox he was recommended per ID to decrease dose to 20 mg daily until Zyvox is completed and subsequently resumed back to home regimen of 40 mg daily.   6.  Hypokalemia -Resolved. -Outpatient follow-up.   7.  Hyperbilirubinemia -Noted to be elevated since August. -Bilirubin trended down and hyperbilirubinemia resolved -Right upper quadrant ultrasound somewhat limited due to body habitus and lack of acoustic window otherwise unremarkable.  No biliary ductal dilatation identified. -Outpatient follow-up.  8.  Dehydration Was on IV fluids which have been kvo. -Outpatient follow-up.   9.  History of PE/DVT -Patient maintained on home regimen Eliquis.   10.  Dyspnea -Patient noted to have some dyspnea and noted to be tachypneic on 12/22/2020. -Patient placed on scheduled nebs. -Patient IV fluids with saline locked and patient given a dose of IV Lasix (12/22/2020)with a urine output of 1.7 L. -Patient on torsemide as needed prior to admission. -Patient placed on Lasix 40 mg daily with good urine output and significant clinical improvement.   -Diuretics were held.   -Home regimen torsemide will be resumed on discharge.   -Outpatient follow-up with PCP.    11.  Pressure injury, POA Pressure Injury 12/20/20 Buttocks Left;Proximal Stage 3 -  Full thickness tissue loss. Subcutaneous fat may be visible but bone, tendon or muscle are NOT exposed. (Active)  12/20/20 1800  Location: Buttocks  Location Orientation: Left;Proximal  Staging: Stage 3 -  Full thickness tissue loss. Subcutaneous fat may be visible but bone, tendon or muscle are NOT exposed.  Wound Description (Comments):   Present on Admission: Yes         Procedures: CT head CT C-spine 12/20/2020 Chest x-ray 12/20/2020 Plain films of the right tib-fib 12/20/2020 Plain films of the pelvis 12/20/2020 Pain.  The right foot 12/20/2020 Abdominal  ultrasound 12/20/2020 Lower extremity ultrasound/Dopplers 12/22/2020  Consultations: Oncology: Dr. Marin Olp 12/22/2020 ID: Dr. Tommy Medal 12/22/2020 General surgery: Dr. Redmond Pulling 12/22/2020  Discharge Exam: Patient seen and examined at bedside.  Denies fever, nausea or vomiting. Vitals:   12/26/20 2003 12/27/20 0503  BP: (!) 144/88 103/64  Pulse: 71 69  Resp: 18 18  Temp: 97.8 F (36.6 C) 97.9 F (36.6 C)  SpO2: 96% (!) 82%    General: No acute distress.   Cardiovascular: S1-S2 heard; rate controlled  respiratory: Bilateral decreased breath sounds at bases Abdomen: Soft, nontender, bowel sounds heard Extremities: Trace lower extremity edema present; no cyanosis Discharge Instructions   Discharge Instructions     Diet - low sodium heart healthy   Complete by: As directed    Discharge wound care:   Complete by: As directed    As above   Increase activity slowly   Complete by: As directed       Allergies as of 12/27/2020       Reactions   Cefepime Hives, Shortness Of Breath   Cephaeline Hives   Morphine Swelling   A swollen stomach.   Morphine And Related Shortness Of Breath, Nausea And Vomiting, Swelling, Other (See Comments)   Agitation, tolerates dilaudid Other reaction(s): Other (See Comments) Agitation, tolerates dilaudid   Penicillins Hives, Shortness Of Breath, Rash   Has patient had a PCN reaction  causing immediate rash, facial/tongue/throat swelling, SOB or lightheadedness with hypotension: Yes Has patient had a PCN reaction causing severe rash involving mucus membranes or skin necrosis: Yes Has patient had a PCN reaction that required hospitalization No Has patient had a PCN reaction occurring within the last 10 years: No If all of the above answers are "NO", then may proceed with Cephalosporin use.   Doxycycline Rash   Oxycodone-acetaminophen Other (See Comments)   Patient doesn't remember what type of reaction.        Medication List     STOP taking  these medications    sulfamethoxazole-trimethoprim 800-160 MG tablet Commonly known as: BACTRIM DS       TAKE these medications    albuterol 108 (90 Base) MCG/ACT inhaler Commonly known as: VENTOLIN HFA Inhale 1-2 puffs into the lungs every 4 (four) hours as needed for wheezing or shortness of breath.   ALPRAZolam 0.5 MG tablet Commonly known as: XANAX Take 1 tablet (0.5 mg total) by mouth 3 (three) times daily as needed for anxiety. for anxiety   apixaban 5 MG Tabs tablet Commonly known as: ELIQUIS Take 1 tablet (5 mg total) by mouth 2 (two) times daily.   B-6 PO Take 1 tablet by mouth daily.   FLUoxetine 20 MG capsule Commonly known as: PROZAC Take 1 capsule (20 mg total) by mouth daily for 5 days. What changed: You were already taking a medication with the same name, and this prescription was added. Make sure you understand how and when to take each.   FLUoxetine 40 MG capsule Commonly known as: PROZAC Take 1 capsule (40 mg total) by mouth daily. Start taking on: January 01, 2021 What changed: These instructions start on January 01, 2021. If you are unsure what to do until then, ask your doctor or other care provider.   fluticasone 50 MCG/ACT nasal spray Commonly known as: FLONASE INSTILL 2 SPRAYS IN EACH NOSTRIL EVERY DAY What changed:  how much to take how to take this when to take this reasons to take this additional instructions   gabapentin 300 MG capsule Commonly known as: NEURONTIN Take 2 capsules (600 mg total) by mouth at bedtime.   HYDROmorphone 2 MG tablet Commonly known as: DILAUDID Take 1-1.5 tablets (2-3 mg total) by mouth every 8 (eight) hours as needed for severe pain.   lidocaine-prilocaine cream Commonly known as: EMLA Apply 1 application topically as needed.   linezolid 600 MG tablet Commonly known as: ZYVOX Take 1 tablet (600 mg total) by mouth every 12 (twelve) hours for 5 days.   Melatonin 10 MG Tabs Take 10 mg by mouth at  bedtime.   metoprolol tartrate 25 MG tablet Commonly known as: LOPRESSOR TAKE 1 TABLET TWO TIMES DAILY. What changed: See the new instructions.   mupirocin ointment 2 % Commonly known as: BACTROBAN On leg wound w/dressing change qd or bid   pantoprazole 40 MG tablet Commonly known as: PROTONIX Take 1 tablet (40 mg total) by mouth every evening. What changed:  when to take this reasons to take this   polyvinyl alcohol 1.4 % ophthalmic solution Commonly known as: LIQUIFILM TEARS Place 1 drop into both eyes as needed for dry eyes.   predniSONE 5 MG tablet Commonly known as: DELTASONE Take 15 mg by mouth daily.   temazepam 15 MG capsule Commonly known as: RESTORIL Take 1 capsule (15 mg total) by mouth at bedtime as needed for sleep.   torsemide 100 MG tablet Commonly known as: DEMADEX  Take 0.5-1 tablets (50-100 mg total) by mouth daily as needed. What changed: reasons to take this   vitamin B-12 1000 MCG tablet Commonly known as: CYANOCOBALAMIN Take 1,000 mcg by mouth daily.   Vitamin D3 50 MCG (2000 UT) capsule Take 1 capsule (2,000 Units total) by mouth daily.               Discharge Care Instructions  (From admission, onward)           Start     Ordered   12/25/20 0000  Discharge wound care:       Comments: As above   12/25/20 1346               Allergies  Allergen Reactions   Cefepime Hives and Shortness Of Breath   Cephaeline Hives   Morphine Swelling    A swollen stomach.   Morphine And Related Shortness Of Breath, Nausea And Vomiting, Swelling and Other (See Comments)    Agitation, tolerates dilaudid Other reaction(s): Other (See Comments) Agitation, tolerates dilaudid   Penicillins Hives, Shortness Of Breath and Rash    Has patient had a PCN reaction causing immediate rash, facial/tongue/throat swelling, SOB or lightheadedness with hypotension: Yes Has patient had a PCN reaction causing severe rash involving mucus membranes or  skin necrosis: Yes Has patient had a PCN reaction that required hospitalization No Has patient had a PCN reaction occurring within the last 10 years: No If all of the above answers are "NO", then may proceed with Cephalosporin use.    Doxycycline Rash   Oxycodone-Acetaminophen Other (See Comments)    Patient doesn't remember what type of reaction.    Contact information for follow-up providers     Stark Klein, MD Follow up on 01/08/2021.   Specialty: General Surgery Why: 11:30am arrive by 11:15am Contact information: [place] [address] [phone]         Tommy Medal, Lavell Islam, MD Follow up on 01/10/2021.   Specialty: Infectious Diseases Why: f/u at 245pm Contact information: [address] [place] [address] [phone]         Plotnikov, Evie Lacks, MD. Schedule an appointment as soon as possible for a visit in 2 week(s).   Specialty: Internal Medicine Contact information: [place] [address] [phone]         Josue Hector, MD .   Specialty: Cardiology Contact information: [id number] [place] [address] [phone]         [name], MD Follow up.   Specialty: Oncology Why: Follow-up as scheduled. Contact information: [address] [place] [address] [phone]              Contact information for after-discharge care     Destination     [facility] Preferred SNF .   Service: Skilled Nursing Contact information: [address] [place] [phone]                      The results of significant diagnostics from this hospitalization (including imaging, microbiology, ancillary and laboratory) are listed below for reference.    Significant Diagnostic Studies: DG Chest 1 View  Result Date: 12/20/2020 CLINICAL DATA:  Found on floor. EXAM: CHEST  1 VIEW  COMPARISON:  05/26/2020 FINDINGS: Chronic elevation of the right hemidiaphragm. Left Port-A-Cath remains in place with the tip in  the SVC. Mild cardiomegaly. No confluent airspace opacities or effusions. Bilateral shoulder replacements. No acute bony abnormality. IMPRESSION: Chronic elevation of the right hemidiaphragm. No acute cardiopulmonary disease. Electronically Signed   By: Rolm Baptise M.D.   On: 12/20/2020 10:15   DG Pelvis 1-2 Views  Result Date: 12/20/2020 CLINICAL DATA:  Fall EXAM: PELVIS - 1-2 VIEW COMPARISON:  CT pelvis 07/20/2020 FINDINGS: There is no acute fracture or dislocation. Femoroacetabular alignment is normal. The SI joints and symphysis pubis are intact. There is mild degenerative change of both hips. Lower lumbar spinal fusion hardware is partially imaged. IMPRESSION: No acute fracture or dislocation. Electronically Signed   By: Valetta Mole M.D.   On: 12/20/2020 10:17   DG Elbow Complete Left  Result Date: 12/20/2020 CLINICAL DATA:  Fall with left elbow pain EXAM: LEFT ELBOW - COMPLETE 3+ VIEW COMPARISON:  None. FINDINGS: There is no evidence of fracture, dislocation, or joint effusion. Generalized osteopenia. IMPRESSION: No acute finding. Electronically Signed   By: Jorje Guild M.D.   On: 12/20/2020 10:15   DG Tibia/Fibula Right  Result Date: 12/20/2020 CLINICAL DATA:  Right leg pain after fall. EXAM: RIGHT TIBIA AND FIBULA - 2 VIEW COMPARISON:  None. FINDINGS: Status post right knee and ankle arthroplasty. No fracture or dislocation is noted. IMPRESSION: No acute abnormality seen. Electronically Signed   By: Marijo Conception M.D.   On: 12/20/2020 10:19   CT Head Wo Contrast  Result Date: 12/20/2020 CLINICAL DATA:  Head trauma, minor (Age >= 65y) Fall. EXAM: CT HEAD WITHOUT CONTRAST TECHNIQUE: Contiguous axial images were obtained from the base of the skull through the vertex without intravenous contrast. COMPARISON:  05/25/2020 FINDINGS: Brain: Chronic microvascular  disease throughout the deep white matter, stable since prior study. No acute intracranial abnormality. Specifically, no hemorrhage, hydrocephalus, mass lesion, acute infarction, or significant intracranial injury. Vascular: No hyperdense vessel or unexpected calcification. Skull: No acute calvarial abnormality. Sinuses/Orbits: No acute findings Other: None IMPRESSION: Similar chronic small vessel disease. No acute intracranial abnormality. Electronically Signed   By: Rolm Baptise M.D.   On: 12/20/2020 10:29   CT Cervical Spine Wo Contrast  Result Date: 12/20/2020 CLINICAL DATA:  Neck trauma (Age >= 65y).  Fall. EXAM: CT CERVICAL SPINE WITHOUT CONTRAST TECHNIQUE: Multidetector CT imaging of the cervical spine was performed without intravenous contrast. Multiplanar CT image reconstructions were also generated. COMPARISON:  06/04/2016 FINDINGS: Alignment: Degenerative anterolisthesis of C3 on C4, C4 on C5 and C7 on T1, stable since prior study. Skull base and vertebrae: No acute fracture. No primary bone lesion or focal pathologic process. Soft tissues and spinal canal: No prevertebral fluid or swelling. No visible canal hematoma. Disc levels: Diffuse advanced degenerative disc disease and facet disease. Upper chest: No acute findings Other: None IMPRESSION: Diffuse advanced degenerative disc disease and facet disease. Multilevel degenerative anterolisthesis, stable since prior study. No acute bony abnormality. Electronically Signed   By: Rolm Baptise M.D.   On: 12/20/2020 10:32   DG CHEST PORT 1 VIEW  Result Date: 12/22/2020 CLINICAL DATA:  Tachypnea EXAM: PORTABLE CHEST 1 VIEW COMPARISON:  12/20/2020 FINDINGS: Bilateral shoulder arthroplasty. Left Port-A-Cath tip at low SVC. Midline trachea. Cardiomegaly accentuated by AP portable technique. Atherosclerosis in the transverse aorta. Possible small left pleural effusion. Skin fold over the right hemithorax. No pneumothorax. Left greater than right base airspace  disease. Marked right hemidiaphragm elevation. IMPRESSION: Possible small left pleural effusion. Otherwise, similar appearance of bibasilar Airspace disease, likely atelectasis. Cardiomegaly with low  lung volumes.  No overt congestive failure. Aortic Atherosclerosis (ICD10-I70.0). Electronically Signed   By: Abigail Miyamoto M.D.   On: 12/22/2020 10:55   DG Foot 2 Views Right  Result Date: 12/20/2020 CLINICAL DATA:  Right foot pain after fall. EXAM: RIGHT FOOT - 2 VIEW COMPARISON:  January 29, 2013. FINDINGS: Status post arthroplasty of talotibial joint. Status post surgical fusion of the first and second tarsometatarsal joints. Probable arthroplasty involving the third tarsometatarsal joint. Stable chronic resorption is seen involving the distal portions of the third, fourth and fifth metatarsals. No acute fracture or dislocation is noted. No soft tissue abnormality is noted. IMPRESSION: Chronic postsurgical and degenerative changes are noted as described above. No definite acute abnormality is seen. Electronically Signed   By: Marijo Conception M.D.   On: 12/20/2020 10:18   MYOCARDIAL PERFUSION IMAGING  Result Date: 12/18/2020   The study is normal. The study is low risk.   No ST deviation was noted.   Left ventricular function is normal. Nuclear stress EF: 80 %. The left ventricular ejection fraction is hyperdynamic (>65%).   Prior study available for comparison from 05/20/2014. Normal stress nuclear study with no ischemia or infarction.  Gated ejection fraction 80% with normal wall motion.   [place] LT LOWER EXTREM LTD SOFT TISSUE NON VASCULAR  Result Date: 12/22/2020 CLINICAL DATA:  Open thigh wound.  Rule out abscess. EXAM: ULTRASOUND LEFT LOWER EXTREMITY LIMITED TECHNIQUE: Ultrasound examination of the lower extremity soft tissues was performed in the area of clinical concern. COMPARISON:  None. FINDINGS: In the area of clinical concern within the left thigh, a superficial avascular heterogeneously hypoechoic  lesion measures 2.3 x 1.4 x 2.1 cm. No vascularity within. Positioned just superficial to a large vessel, likely the femoral artery. No communication identified. IMPRESSION: Complex collection within the superficial thigh, suggesting phlegmon, chronic hematuria or early abscess. Please note that this is immediately superficial to a large vessel, presumably the femoral artery. Electronically Signed   By: Abigail Miyamoto M.D.   On: 12/22/2020 15:13   VAS [place] LOWER EXTREMITY VENOUS (DVT)  Result Date: 12/22/2020  Lower Venous DVT Study Patient Name:  JAREE TRINKA  Date of Exam:   12/22/2020 Medical Rec #: [id number]           Accession #:    [id number] Date of Birth: 1945/07/24           Patient Gender: M Patient Age:   46 years Exam Location:  [facility] Procedure:      VAS [place] LOWER EXTREMITY VENOUS (DVT) Referring Phys: Claiborne Billings OSBORNE --------------------------------------------------------------------------------  Indications: Pain, Swelling, and Erythema. Other Indications: LLE melanoma and lymph node excision in 08/2020 and per                    patient has never healed. Risk Factors: Chemotherapy HX of PE 05/2019 Cancer HX of melanoma DVT HX 05/2019. Anticoagulation: Eliquis - for AFIB. Limitations: Body habitus and poor ultrasound/tissue interface. Comparison Study: Previous exam 08/03/2020 - negative for DVT. Performing Technologist: Rogelia Rohrer RVT, RDMS  Examination Guidelines: A complete evaluation includes B-mode imaging, spectral Doppler, color Doppler, and power Doppler as needed of all accessible portions of each vessel. Bilateral testing is considered an integral part of a complete examination. Limited examinations for reoccurring indications may be performed as noted. The reflux portion of the exam is performed with the patient in reverse Trendelenburg.  +-----+---------------+---------+-----------+----------+--------------+ RIGHTCompressibilityPhasicitySpontaneityPropertiesThrombus Aging  +-----+---------------+---------+-----------+----------+--------------+ CFV  Full  Yes      Yes                                 +-----+---------------+---------+-----------+----------+--------------+   +---------+---------------+---------+-----------+----------+-------------------+ LEFT     CompressibilityPhasicitySpontaneityPropertiesThrombus Aging      +---------+---------------+---------+-----------+----------+-------------------+ CFV      Full           Yes      Yes                                      +---------+---------------+---------+-----------+----------+-------------------+ SFJ      Full                                                             +---------+---------------+---------+-----------+----------+-------------------+ FV Prox  Full           Yes      Yes                                      +---------+---------------+---------+-----------+----------+-------------------+ FV Mid   Full           Yes      Yes                                      +---------+---------------+---------+-----------+----------+-------------------+ FV DistalFull           Yes      Yes                                      +---------+---------------+---------+-----------+----------+-------------------+ PFV      Full                                                             +---------+---------------+---------+-----------+----------+-------------------+ POP      Full           Yes      Yes                                      +---------+---------------+---------+-----------+----------+-------------------+ PTV                                                   Not well visualized +---------+---------------+---------+-----------+----------+-------------------+ PERO                                                  Not well visualized +---------+---------------+---------+-----------+----------+-------------------+  Poor visualization of PTV and  PeroV due to excesive edema.    Summary: RIGHT: - No evidence of common femoral vein obstruction.  LEFT: - Portions of this examination were limited- see technologist comments above. - There is no evidence of deep vein thrombosis in the lower extremity. - There is no evidence of superficial venous thrombosis.  Compex heterogenous/ hypoechoic structure seen in area of groin (3.2 x 2.5 x 1.7 cm).  *See table(s) above for measurements and observations. Electronically signed by Orlie Pollen on 12/22/2020 at 7:09:34 PM.    Final    US Abdomen Limited RUQ (LIVER/GB)  Result Date: 12/20/2020 CLINICAL DATA:  Hyperbilirubinemia EXAM: ULTRASOUND ABDOMEN LIMITED RIGHT UPPER QUADRANT COMPARISON:  CT abdomen/pelvis 10/14/2017, abdominal ultrasound 12/09/2006 FINDINGS: Gallbladder: No gallstones or wall thickening visualized. No sonographic Murphy sign noted by sonographer. Common bile duct: Diameter: 5 mm Liver: Evaluation of the liver is somewhat limited due to body habitus and lack of an acoustic window. Within these confines, no focal lesion identified. Within normal limits in parenchymal echogenicity. Portal vein is patent on color Doppler imaging with normal direction of blood flow towards the liver. Other: None. IMPRESSION: Evaluation of the liver is somewhat limited due to body habitus and lack of an acoustic window. Within these confines, unremarkable right upper quadrant ultrasound. No biliary ductal dilatation identified. Electronically Signed   By: Valetta Mole M.D.   On: 12/20/2020 15:56    Microbiology: Recent Results (from the past 240 hour(s))  Culture, blood (routine x 2)     Status: None   Collection Time: 12/20/20  9:21 AM   Specimen: BLOOD  Result Value Ref Range Status   Specimen Description   Final    BLOOD PORTA CATH Performed at [facility] [address], [place], [place] [address]    Special Requests   Final    BOTTLES DRAWN AEROBIC AND ANAEROBIC Blood Culture  adequate volume Performed at [facility] [address], [place], [place] [address]    Culture   Final    NO GROWTH 5 DAYS Performed at [facility], [place] [address], [place], [place] [address]    Report Status 12/25/2020 FINAL  Final  Resp Panel by RT-PCR (Flu A&B, Covid) Nasopharyngeal Swab     Status: None   Collection Time: 12/20/20  9:22 AM   Specimen: Nasopharyngeal Swab; Nasopharyngeal(NP) swabs in vial transport medium  Result Value Ref Range Status   SARS Coronavirus 2 by RT PCR NEGATIVE NEGATIVE Final    Comment: (NOTE) SARS-CoV-2 target nucleic acids are NOT DETECTED.  The SARS-CoV-2 RNA is generally detectable in upper respiratory specimens during the acute phase of infection. The lowest concentration of SARS-CoV-2 viral copies this assay can detect is 138 copies/mL. A negative result does not preclude SARS-Cov-2 infection and should not be used as the sole basis for treatment or other patient management decisions. A negative result may occur with  improper specimen collection/handling, submission of specimen other than nasopharyngeal swab, presence of viral mutation(s) within the areas targeted by this assay, and inadequate number of viral copies(<138 copies/mL). A negative result must be combined with clinical observations, patient history, and epidemiological information. The expected result is Negative.  Fact Sheet for Patients:  EntrepreneurPulse.com.au  Fact Sheet for Healthcare Providers:  IncredibleEmployment.be  This test is no t yet approved or cleared by the [place] FDA and  has been authorized for detection and/or diagnosis of SARS-CoV-2 by FDA under an Emergency  Use Authorization (EUA). This EUA will remain  in effect (meaning this test can be used) for the duration of the COVID-19 declaration under Section 564(b)(1) of the Act, 21 U.S.C.section 360bbb-3(b)(1), unless the authorization  is terminated  or revoked sooner.       Influenza A by PCR NEGATIVE NEGATIVE Final   Influenza B by PCR NEGATIVE NEGATIVE Final    Comment: (NOTE) The Xpert Xpress SARS-CoV-2/FLU/RSV plus assay is intended as an aid in the diagnosis of influenza from Nasopharyngeal swab specimens and should not be used as a sole basis for treatment. Nasal washings and aspirates are unacceptable for Xpert Xpress SARS-CoV-2/FLU/RSV testing.  Fact Sheet for Patients: EntrepreneurPulse.com.au  Fact Sheet for Healthcare Providers: IncredibleEmployment.be  This test is not yet approved or cleared by the [place] FDA and has been authorized for detection and/or diagnosis of SARS-CoV-2 by FDA under an Emergency Use Authorization (EUA). This EUA will remain in effect (meaning this test can be used) for the duration of the COVID-19 declaration under Section 564(b)(1) of the Act, 21 U.S.C. section 360bbb-3(b)(1), unless the authorization is terminated or revoked.  Performed at [facility], [place] [address], [place], [place] [address]   Culture, blood (routine x 2)     Status: None   Collection Time: 12/20/20  9:26 AM   Specimen: BLOOD  Result Value Ref Range Status   Specimen Description   Final    BLOOD LEFT ANTECUBITAL Performed at [facility] [address] [place], [place] [address]    Special Requests   Final    BOTTLES DRAWN AEROBIC ONLY Blood Culture adequate volume Performed at [facility] [address], [place], [place] [address]    Culture   Final    NO GROWTH 5 DAYS Performed at [facility], [place] [address] [place], [place] [address]    Report Status 12/25/2020 FINAL  Final  Resp Panel by RT-PCR (Flu A&B, Covid) Nasopharyngeal Swab     Status: None   Collection Time: 12/25/20  3:55 PM   Specimen: Nasopharyngeal Swab; Nasopharyngeal(NP) swabs in vial transport medium  Result  Value Ref Range Status   SARS Coronavirus 2 by RT PCR NEGATIVE NEGATIVE Final    Comment: (NOTE) SARS-CoV-2 target nucleic acids are NOT DETECTED.  The SARS-CoV-2 RNA is generally detectable in upper respiratory specimens during the acute phase of infection. The lowest concentration of SARS-CoV-2 viral copies this assay can detect is 138 copies/mL. A negative result does not preclude SARS-Cov-2 infection and should not be used as the sole basis for treatment or other patient management decisions. A negative result may occur with  improper specimen collection/handling, submission of specimen other than nasopharyngeal swab, presence of viral mutation(s) within the areas targeted by this assay, and inadequate number of viral copies(<138 copies/mL). A negative result must be combined with clinical observations, patient history, and epidemiological information. The expected result is Negative.  Fact Sheet for Patients:  EntrepreneurPulse.com.au  Fact Sheet for Healthcare Providers:  IncredibleEmployment.be  This test is no t yet approved or cleared by the [place] FDA and  has been authorized for detection and/or diagnosis of SARS-CoV-2 by FDA under an Emergency Use Authorization (EUA). This EUA will remain  in effect (meaning this test can be used) for the duration of the COVID-19 declaration under Section 564(b)(1) of the Act, 21 U.S.C.section 360bbb-3(b)(1), unless the authorization is terminated  or revoked sooner.       Influenza A  by PCR NEGATIVE NEGATIVE Final   Influenza B by PCR NEGATIVE NEGATIVE Final    Comment: (NOTE) The Xpert Xpress SARS-CoV-2/FLU/RSV plus assay is intended as an aid in the diagnosis of influenza from Nasopharyngeal swab specimens and should not be used as a sole basis for treatment. Nasal washings and aspirates are unacceptable for Xpert Xpress SARS-CoV-2/FLU/RSV testing.  Fact Sheet for  Patients: EntrepreneurPulse.com.au  Fact Sheet for Healthcare Providers: IncredibleEmployment.be  This test is not yet approved or cleared by the [place] FDA and has been authorized for detection and/or diagnosis of SARS-CoV-2 by FDA under an Emergency Use Authorization (EUA). This EUA will remain in effect (meaning this test can be used) for the duration of the COVID-19 declaration under Section 564(b)(1) of the Act, 21 U.S.C. section 360bbb-3(b)(1), unless the authorization is terminated or revoked.  Performed at [facility], [place] [address] [place], [place] [address]      Labs: Basic Metabolic Panel: Recent Labs  Lab 12/21/20 0928 12/22/20 0406 12/23/20 0338 12/24/20 0850 12/25/20 1145 12/27/20 0324  NA 141 138 136 139 138 135  K 3.4* 4.3 4.3 4.0 3.6 3.8  CL 104 105 102 107 100 96*  CO2 32 31 30 27 29  33*  GLUCOSE 232* 99 84 70 97 87  BUN 32* 26* 19 16 16 14   CREATININE 0.96 0.78 0.68 0.68 0.67 0.74  CALCIUM 7.9* 7.7* 7.8* 7.8* 8.1* 8.3*  MG 2.6*  --   --   --  1.9 1.9  PHOS  --   --   --   --  3.4  --     Liver Function Tests: Recent Labs  Lab 12/20/20 1939 12/21/20 0928 12/25/20 1145  AST 26 40  --   ALT 19 26  --   ALKPHOS 45 44  --   BILITOT 1.5* 0.9  --   PROT 5.3* 5.5*  --   ALBUMIN 2.9* 2.7* 2.9*    No results for input(s): LIPASE, AMYLASE in the last 168 hours. No results for input(s): AMMONIA in the last 168 hours. CBC: Recent Labs  Lab 12/21/20 0928 12/22/20 0406 12/23/20 0338 12/24/20 0657 12/25/20 1145 12/27/20 0324  WBC 11.6* 10.7* 7.3 9.4 8.3 7.9  NEUTROABS 10.1* 9.4*  --  7.0 5.8  --   HGB 10.8* 10.4* 10.0* 12.1* 11.6* 11.7*  HCT 36.2* 34.4* 32.9* 37.9* 37.1* 37.1*  MCV 102.0* 100.9* 99.7 97.4 96.1 96.1  PLT 126* 136* 143* 166 192 246    Cardiac Enzymes: No results for input(s): CKTOTAL, CKMB, CKMBINDEX, TROPONINI in the last 168 hours. BNP: BNP (last 3  results) No results for input(s): BNP in the last 8760 hours.  ProBNP (last 3 results) No results for input(s): PROBNP in the last 8760 hours.  CBG: No results for input(s): GLUCAP in the last 168 hours.     Signed:  Aline August MD.  [facility] 12/27/2020, 9:42 AM

## 2020-12-27 NOTE — Progress Notes (Addendum)
MD order to transfer.  Report called to [facility], spoke with RN Lenward Chancellor and given report.   Education completed as pt does verbalize  understanding.   Discharge instructions provided in D/C packet.  [facility] to transport pt.

## 2020-12-27 NOTE — Consult Note (Signed)
[facility] Inpatient Consult   12/27/2020  [name] [birth year] [id number]  [facility] [[facility]] Patient: [name]   Patient chart reviewed due to noted high unplanned readmission risk score to assess for post hospital care coordination transition of care needs. Per review, current disposition is for SNF. No [facility] CM needs.  Of note, [facility] Care Management services does not replace or interfere with any services that are arranged by inpatient case management or social work.   Netta Cedars, MSN, [facility] Liaison Nurse Mobile Phone [phone]  Toll free office [phone]

## 2020-12-28 ENCOUNTER — Encounter: Payer: Medicare Other | Admitting: Rehabilitation

## 2020-12-28 NOTE — Telephone Encounter (Signed)
Transition Care Management Follow-up Telephone Call Date of discharge and from where: [facility] 12/27/2020 How have you been since you were released from the hospital? Sore, slow moving with increased urination Any questions or concerns? Yes  Items Reviewed: Did the pt receive and understand the discharge instructions provided? Yes  Medications obtained and verified? Yes  Other?  Unsure about fluid pill, has felt better since stopping Any new allergies since your discharge? No  Dietary orders reviewed? No Do you have support at home?  Waiting on discharge from [facility] and Equipment/Supplies: Were home health services ordered? no If so, what is the name of the agency? Sent to [facility]  Has the agency set up a time to come to the patient's home? no Were any new equipment or medical supplies ordered?  No What is the name of the medical supply agency? N/a Were you able to get the supplies/equipment? not applicable Do you have any questions related to the use of the equipment or supplies? No  Functional Questionnaire: (I = Independent and D = Dependent) ADLs: D  Bathing/Dressing- D  Meal Prep- D  Eating- D  Maintaining continence- D  Transferring/Ambulation- D  Managing Meds- D  Follow up appointments reviewed:  PCP Hospital f/u appt confirmed? Yes  Scheduled to see Dr  Alain Marion on 9/27 @ 810. [facility] f/u appt confirmed? No   Are transportation arrangements needed? No  If their condition worsens, is the pt aware to call PCP or go to the Emergency Dept.? Yes Was the patient provided with contact information for the PCP's office or ED? Yes Was to pt encouraged to call back with questions or concerns? Yes      Spoke with patient.  Appointment scheduled.

## 2021-01-01 ENCOUNTER — Telehealth: Payer: Self-pay

## 2021-01-01 NOTE — Telephone Encounter (Signed)
Patient left VM wishing to notify Dr Marin Olp that he was discharged to [facility] and expects to be there an additional 7-10 days. Pt plans to r/s PET & MD appt that were missed due to hospitalization when he returns home. MD aware. dph

## 2021-01-02 ENCOUNTER — Ambulatory Visit: Payer: Medicare Other | Admitting: Internal Medicine

## 2021-01-02 ENCOUNTER — Encounter: Payer: Medicare Other | Admitting: Physical Therapy

## 2021-01-03 ENCOUNTER — Other Ambulatory Visit (HOSPITAL_COMMUNITY): Payer: Medicare Other

## 2021-01-04 ENCOUNTER — Ambulatory Visit: Payer: Medicare Other | Admitting: Infectious Disease

## 2021-01-04 ENCOUNTER — Telehealth: Payer: Self-pay | Admitting: Internal Medicine

## 2021-01-04 ENCOUNTER — Encounter: Payer: Medicare Other | Admitting: Rehabilitation

## 2021-01-04 DIAGNOSIS — S71102D Unspecified open wound, left thigh, subsequent encounter: Secondary | ICD-10-CM | POA: Diagnosis not present

## 2021-01-04 DIAGNOSIS — R5381 Other malaise: Secondary | ICD-10-CM | POA: Diagnosis not present

## 2021-01-04 DIAGNOSIS — L03116 Cellulitis of left lower limb: Secondary | ICD-10-CM | POA: Diagnosis not present

## 2021-01-04 DIAGNOSIS — M069 Rheumatoid arthritis, unspecified: Secondary | ICD-10-CM | POA: Diagnosis not present

## 2021-01-04 NOTE — Telephone Encounter (Signed)
Patient is calling in requesting provider to give the okay for him to be released from rehab  Patient says he is at the [facility] has been there for almost 1 1/2 week & thinks he has completed everything he needs to in order to be discharged & go home  Follow up w/ patient please [phone]

## 2021-01-05 ENCOUNTER — Telehealth: Payer: Self-pay | Admitting: *Deleted

## 2021-01-05 DIAGNOSIS — E43 Unspecified severe protein-calorie malnutrition: Secondary | ICD-10-CM | POA: Diagnosis not present

## 2021-01-05 DIAGNOSIS — I509 Heart failure, unspecified: Secondary | ICD-10-CM | POA: Diagnosis not present

## 2021-01-05 DIAGNOSIS — L89153 Pressure ulcer of sacral region, stage 3: Secondary | ICD-10-CM | POA: Diagnosis not present

## 2021-01-05 DIAGNOSIS — R5381 Other malaise: Secondary | ICD-10-CM | POA: Diagnosis not present

## 2021-01-05 NOTE — Telephone Encounter (Signed)
Message left from patient to inform Dr. Marin Olp that he will be discharged from rehab tomorrow and would like to know when his next visit will be.  Dr. Marin Olp notified and would like to see pt in 2-3 weeks.  Message sent to scheduling.

## 2021-01-07 NOTE — Telephone Encounter (Signed)
It is okay with me.  However, he needs to talk to [facility] attending physician on staff about his discharge. Thanks,

## 2021-01-08 ENCOUNTER — Telehealth: Payer: Self-pay | Admitting: *Deleted

## 2021-01-08 DIAGNOSIS — M47819 Spondylosis without myelopathy or radiculopathy, site unspecified: Secondary | ICD-10-CM | POA: Diagnosis not present

## 2021-01-08 DIAGNOSIS — F32A Depression, unspecified: Secondary | ICD-10-CM | POA: Diagnosis not present

## 2021-01-08 DIAGNOSIS — I700 Atherosclerosis of aorta: Secondary | ICD-10-CM | POA: Diagnosis not present

## 2021-01-08 DIAGNOSIS — D630 Anemia in neoplastic disease: Secondary | ICD-10-CM | POA: Diagnosis not present

## 2021-01-08 DIAGNOSIS — G4700 Insomnia, unspecified: Secondary | ICD-10-CM | POA: Diagnosis not present

## 2021-01-08 DIAGNOSIS — L03116 Cellulitis of left lower limb: Secondary | ICD-10-CM | POA: Diagnosis not present

## 2021-01-08 DIAGNOSIS — G894 Chronic pain syndrome: Secondary | ICD-10-CM | POA: Diagnosis not present

## 2021-01-08 DIAGNOSIS — F419 Anxiety disorder, unspecified: Secondary | ICD-10-CM | POA: Diagnosis not present

## 2021-01-08 DIAGNOSIS — C439 Malignant melanoma of skin, unspecified: Secondary | ICD-10-CM | POA: Diagnosis not present

## 2021-01-08 DIAGNOSIS — D696 Thrombocytopenia, unspecified: Secondary | ICD-10-CM | POA: Diagnosis not present

## 2021-01-08 DIAGNOSIS — Z7901 Long term (current) use of anticoagulants: Secondary | ICD-10-CM | POA: Diagnosis not present

## 2021-01-08 DIAGNOSIS — K219 Gastro-esophageal reflux disease without esophagitis: Secondary | ICD-10-CM | POA: Diagnosis not present

## 2021-01-08 DIAGNOSIS — B9689 Other specified bacterial agents as the cause of diseases classified elsewhere: Secondary | ICD-10-CM | POA: Diagnosis not present

## 2021-01-08 DIAGNOSIS — E43 Unspecified severe protein-calorie malnutrition: Secondary | ICD-10-CM | POA: Diagnosis not present

## 2021-01-08 DIAGNOSIS — M961 Postlaminectomy syndrome, not elsewhere classified: Secondary | ICD-10-CM | POA: Diagnosis not present

## 2021-01-08 DIAGNOSIS — I2510 Atherosclerotic heart disease of native coronary artery without angina pectoris: Secondary | ICD-10-CM | POA: Diagnosis not present

## 2021-01-08 DIAGNOSIS — I480 Paroxysmal atrial fibrillation: Secondary | ICD-10-CM | POA: Diagnosis not present

## 2021-01-08 DIAGNOSIS — I110 Hypertensive heart disease with heart failure: Secondary | ICD-10-CM | POA: Diagnosis not present

## 2021-01-08 DIAGNOSIS — Z8616 Personal history of COVID-19: Secondary | ICD-10-CM | POA: Diagnosis not present

## 2021-01-08 DIAGNOSIS — J449 Chronic obstructive pulmonary disease, unspecified: Secondary | ICD-10-CM | POA: Diagnosis not present

## 2021-01-08 DIAGNOSIS — I509 Heart failure, unspecified: Secondary | ICD-10-CM | POA: Diagnosis not present

## 2021-01-08 DIAGNOSIS — M069 Rheumatoid arthritis, unspecified: Secondary | ICD-10-CM | POA: Diagnosis not present

## 2021-01-08 DIAGNOSIS — L03314 Cellulitis of groin: Secondary | ICD-10-CM | POA: Diagnosis not present

## 2021-01-08 DIAGNOSIS — R32 Unspecified urinary incontinence: Secondary | ICD-10-CM | POA: Diagnosis not present

## 2021-01-08 DIAGNOSIS — S81811D Laceration without foreign body, right lower leg, subsequent encounter: Secondary | ICD-10-CM | POA: Diagnosis not present

## 2021-01-08 DIAGNOSIS — L89321 Pressure ulcer of left buttock, stage 1: Secondary | ICD-10-CM | POA: Diagnosis not present

## 2021-01-08 NOTE — Telephone Encounter (Signed)
Per scheduling message 01/08/21 [name] - called and gave upcoming appointments - confirmed

## 2021-01-09 ENCOUNTER — Encounter: Payer: Medicare Other | Admitting: Rehabilitation

## 2021-01-09 NOTE — Telephone Encounter (Signed)
Pt has been discharge. Has f/u appt tomorrow 01/10/21.Marland KitchenJohny [name]

## 2021-01-10 ENCOUNTER — Telehealth: Payer: Self-pay | Admitting: Internal Medicine

## 2021-01-10 ENCOUNTER — Encounter: Payer: Self-pay | Admitting: Internal Medicine

## 2021-01-10 ENCOUNTER — Ambulatory Visit (INDEPENDENT_AMBULATORY_CARE_PROVIDER_SITE_OTHER): Payer: Medicare Other | Admitting: Internal Medicine

## 2021-01-10 ENCOUNTER — Other Ambulatory Visit: Payer: Self-pay

## 2021-01-10 ENCOUNTER — Ambulatory Visit: Payer: Medicare Other | Admitting: Infectious Disease

## 2021-01-10 VITALS — BP 130/82 | HR 64 | Temp 98.0°F | Ht 66.0 in | Wt 147.4 lb

## 2021-01-10 DIAGNOSIS — Z23 Encounter for immunization: Secondary | ICD-10-CM

## 2021-01-10 DIAGNOSIS — C4372 Malignant melanoma of left lower limb, including hip: Secondary | ICD-10-CM | POA: Diagnosis not present

## 2021-01-10 DIAGNOSIS — S71102D Unspecified open wound, left thigh, subsequent encounter: Secondary | ICD-10-CM

## 2021-01-10 DIAGNOSIS — S81811D Laceration without foreign body, right lower leg, subsequent encounter: Secondary | ICD-10-CM | POA: Diagnosis not present

## 2021-01-10 DIAGNOSIS — M05711 Rheumatoid arthritis with rheumatoid factor of right shoulder without organ or systems involvement: Secondary | ICD-10-CM | POA: Diagnosis not present

## 2021-01-10 DIAGNOSIS — L89151 Pressure ulcer of sacral region, stage 1: Secondary | ICD-10-CM

## 2021-01-10 DIAGNOSIS — L89321 Pressure ulcer of left buttock, stage 1: Secondary | ICD-10-CM | POA: Diagnosis not present

## 2021-01-10 DIAGNOSIS — I2510 Atherosclerotic heart disease of native coronary artery without angina pectoris: Secondary | ICD-10-CM

## 2021-01-10 DIAGNOSIS — L03314 Cellulitis of groin: Secondary | ICD-10-CM | POA: Diagnosis not present

## 2021-01-10 DIAGNOSIS — B9689 Other specified bacterial agents as the cause of diseases classified elsewhere: Secondary | ICD-10-CM | POA: Diagnosis not present

## 2021-01-10 DIAGNOSIS — R296 Repeated falls: Secondary | ICD-10-CM

## 2021-01-10 DIAGNOSIS — C439 Malignant melanoma of skin, unspecified: Secondary | ICD-10-CM | POA: Diagnosis not present

## 2021-01-10 DIAGNOSIS — L03116 Cellulitis of left lower limb: Secondary | ICD-10-CM | POA: Diagnosis not present

## 2021-01-10 MED ORDER — ALPRAZOLAM 0.5 MG PO TABS: 0.5000 mg | ORAL_TABLET | Freq: Three times a day (TID) | ORAL | 1 refills | Status: DC | PRN

## 2021-01-10 MED ORDER — HYDROMORPHONE HCL 2 MG PO TABS: 2.0000 mg | ORAL_TABLET | Freq: Three times a day (TID) | ORAL | 0 refills | Status: DC | PRN

## 2021-01-10 NOTE — Telephone Encounter (Signed)
    [place] Name: Mc[place]osh Agency Name: [facility] Phone #: [phone] Service Requested: PT /OT/ SKILLED NURSING/ WOUND CARE  Frequency of Visits: EVAL

## 2021-01-10 NOTE — Progress Notes (Signed)
Subjective:  Patient ID: [name], male    DOB: 04/21/1945  Age: 75 y.o. MRN: [id number]  CC: Follow-up (3 month f/u)   HPI [name] presents for post hospital visit for wound infection.  Follow-up on chronic pain.  He is complaining of open wounds on his left thigh.  He has not had wound care for couple days.  He needs to have his wounds packed.  He wants me to check a pressure sore on his sacrum.  He is here with his aide Sherilyn Cooter.  The patient was hospitalized in September for wound infection.  I reviewed his hospital stay.  Then he spent a month in the rehab facility.  He is back at home.  Per history: "Admit date: 12/20/2020 Discharge date: 12/27/2020 Discharge disposition: SNF   PT/OT now recommending SNF.  He will be discharged to SNF once bed is available.   Time spent: 40 minutes   Recommendations for Outpatient Follow-up:  Follow-up with Dr. Donell Beers, general surgery on 01/08/2021 at 11:15 AM. Follow-up with Dr. Daiva Eves, ID on 01/10/2021 at 2:45 PM. Follow-up with Jaxsyn Azam, Georgina Quint, MD in 2 weeks.  On follow-up patient need a basic metabolic profile and magnesium level checked to follow-up on electrolytes and renal function.  Cellulitis will need to be followed up upon. Follow-up with Dr. Myna Hidalgo as scheduled.     Discharge Condition: Stable and improved   Diet recommendation: Heart healthy     History of present illness:  HPI per Dr. Jeanine Luz is a 75 y.o. male with medical history significant of malignant melanoma, pAfib on eliquis, anxiety. Presented after fall. He reports that he's felt week over the last couple of days. He had a near fall two nights ago, but was able to catch himself. Unfortunately, last night he was unable to catch himself. He states last night he was returning to his bed, when he tripped over a rug. He fell "straight down" on his butt and rolled to his back. He was unable to get up. He remembers the entire fall. There  was no LOC. He did hit the back of his head. He was unable to get help for a few hours per his report. When he was found this morning, EMS was called for help. He denies any other aggravating or alleviating factors.    ED Course: CTH, c-spine negative for acute changes. K+ was 2.4. He was given 10 mEq of K+ IV. It was noted that he had left groin cellulitis, an elevated WBC and an elevated temp. He was started on vanc. [facility] was called for admission.    Hospital course/discharge diagnoses #1 left upper thigh/groin cellulitis surrounding chronic wound/right shin hematoma/SIRS -Patient noted to have a chronic wound after lymph node biopsy 08/2020 and postradiation. -Patient admitted with concerns for SIRS with a fever, leukocytosis, concern for left groin/left upper thigh cellulitis. -Erythema improving. -Wound culture noted to have grown Citrobacter on 8/172022 and received Bactrim in outpatient setting. -Blood cultures with no growth to date. -Leukocytosis trended down and had resolved by day of discharge.. -Patient maintained on IV vancomycin during the hospitalization and subsequently transition to oral Zyvox on day of discharge to complete 5 more days per ID recommendations.  -Patient maintained on home regimen of oral Dilaudid pain regimen. -Patient seen in consultation by ID, Dr.Van Dam who is recommended continuation of vancomycin early on during the hospitalization and on day of discharge was transitioned to Zyvox for 5  more days of treatment per ID recommendations with outpatient follow-up set up with ID.   -Lower extremity ultrasound negative for DVT however shows a complex heterogeneous/hypoechoic structure in the area of the groin 3.2 x 2.5 x 1.7 cm.   -General surgery consulted and recommended continuation of IV antibiotics as recommended per ID with no further debridement or drainage of the wound at this time. -W OC RN assessed patient and recommendations made. -Patient will be discharged  on 5 more days of Zyvox with outpatient follow-up with ID and general surgery.  2.  Fall/generalized weakness -PT/OT currently recommending SNF.  3.  History of malignant melanoma -Currently on radiation/chemo with Dr. Myna Hidalgo, Dr. Darleene Cleaver. -Dr. Myna Hidalgo with oncology notified of admission via epic and follow the patient during the hospitalization.   -Outpatient follow-up with oncology.   4.  Normocytic anemia/mild thrombocytopenia -No evidence of bleeding noted. -Hemoglobin currently stable.  5.  Anxiety -Patient maintained on home dose Prozac.  Once patient was transitioned to Zyvox he was recommended per ID to decrease dose to 20 mg daily until Zyvox is completed and subsequently resumed back to home regimen of 40 mg daily.   6.  Hypokalemia -Resolved. -Outpatient follow-up.   7.  Hyperbilirubinemia -Noted to be elevated since August. -Bilirubin trended down and hyperbilirubinemia resolved -Right upper quadrant ultrasound somewhat limited due to body habitus and lack of acoustic window otherwise unremarkable.  No biliary ductal dilatation identified. -Outpatient follow-up.  8.  Dehydration Was on IV fluids which have been kvo. -Outpatient follow-up.   9.  History of PE/DVT -Patient maintained on home regimen Eliquis.   10.  Dyspnea -Patient noted to have some dyspnea and noted to be tachypneic on 12/22/2020. -Patient placed on scheduled nebs. -Patient IV fluids with saline locked and patient given a dose of IV Lasix (12/22/2020)with a urine output of 1.7 L. -Patient on torsemide as needed prior to admission. -Patient placed on Lasix 40 mg daily with good urine output and significant clinical improvement.   -Diuretics were held.   -Home regimen torsemide will be resumed on discharge.   -Outpatient follow-up with PCP.    11.  Pressure injury, POA Pressure Injury 12/20/20 Buttocks Left;Proximal Stage 3 -  Full thickness tissue loss. Subcutaneous fat may be visible but bone,  tendon or muscle are NOT exposed. (Active)  12/20/20 1800  Location: Buttocks  Location Orientation: Left;Proximal  Staging: Stage 3 -  Full thickness tissue loss. Subcutaneous fat may be visible but bone, tendon or muscle are NOT exposed.  Wound Description (Comments):   Present on Admission: Yes          Procedures: CT head CT C-spine 12/20/2020 Chest x-ray 12/20/2020 Plain films of the right tib-fib 12/20/2020 Plain films of the pelvis 12/20/2020 Pain.  The right foot 12/20/2020 Abdominal ultrasound 12/20/2020 Lower extremity ultrasound/Dopplers 12/22/2020   Consultations: Oncology: Dr. Myna Hidalgo 12/22/2020 ID: Dr. Daiva Eves 12/22/2020 General surgery: Dr. Andrey Campanile 12/22/2020"     Outpatient Medications Prior to Visit  Medication Sig Dispense Refill   albuterol (PROVENTIL HFA;VENTOLIN HFA) 108 (90 Base) MCG/ACT inhaler Inhale 1-2 puffs into the lungs every 4 (four) hours as needed for wheezing or shortness of breath. 1 Inhaler 1   Cholecalciferol (VITAMIN D3) 2000 UNITS capsule Take 1 capsule (2,000 Units total) by mouth daily. 100 capsule 3   FLUoxetine (PROZAC) 40 MG capsule Take 1 capsule (40 mg total) by mouth daily.  3   fluticasone (FLONASE) 50 MCG/ACT nasal spray INSTILL 2 SPRAYS IN  EACH NOSTRIL EVERY DAY (Patient taking differently: Place 2 sprays into both nostrils daily as needed for allergies.) 16 g 11   gabapentin (NEURONTIN) 300 MG capsule Take 2 capsules (600 mg total) by mouth at bedtime. 180 capsule 3   lidocaine-prilocaine (EMLA) cream Apply 1 application topically as needed. 30 g 5   Melatonin 10 MG TABS Take 10 mg by mouth at bedtime.     metoprolol tartrate (LOPRESSOR) 25 MG tablet TAKE 1 TABLET TWO TIMES DAILY. (Patient taking differently: Take 25 mg by mouth 2 (two) times daily.) 180 tablet 3   mupirocin ointment (BACTROBAN) 2 % On leg wound w/dressing change qd or bid 60 g 1   pantoprazole (PROTONIX) 40 MG tablet Take 1 tablet (40 mg total) by mouth every evening.  (Patient taking differently: Take 40 mg by mouth daily as needed (indigestion).) 90 tablet 3   polyvinyl alcohol (LIQUIFILM TEARS) 1.4 % ophthalmic solution Place 1 drop into both eyes as needed for dry eyes.     predniSONE (DELTASONE) 5 MG tablet Take 15 mg by mouth daily.     Pyridoxine HCl (B-6 PO) Take 1 tablet by mouth daily.     temazepam (RESTORIL) 15 MG capsule Take 1 capsule (15 mg total) by mouth at bedtime as needed for sleep. 30 capsule 0   torsemide (DEMADEX) 100 MG tablet Take 0.5-1 tablets (50-100 mg total) by mouth daily as needed. (Patient taking differently: Take 50-100 mg by mouth daily as needed (fluid).) 90 tablet 1   vitamin B-12 (CYANOCOBALAMIN) 1000 MCG tablet Take 1,000 mcg by mouth daily.      ALPRAZolam (XANAX) 0.5 MG tablet Take 1 tablet (0.5 mg total) by mouth 3 (three) times daily as needed for anxiety. for anxiety 180 tablet 1   HYDROmorphone (DILAUDID) 2 MG tablet Take 1-1.5 tablets (2-3 mg total) by mouth every 8 (eight) hours as needed for severe pain. 90 tablet 0   apixaban (ELIQUIS) 5 MG TABS tablet Take 1 tablet (5 mg total) by mouth 2 (two) times daily. 180 tablet 2   FLUoxetine (PROZAC) 20 MG capsule Take 1 capsule (20 mg total) by mouth daily for 5 days. 5 capsule 0   Facility-Administered Medications Prior to Visit  Medication Dose Route Frequency Provider Last Rate Last Admin   heparin lock flush 100 unit/mL  250 Units Intracatheter PRN Erenest Blank, NP       sodium chloride flush (NS) 0.9 % injection 10 mL  10 mL Intracatheter PRN Erenest Blank, NP        ROS: Review of Systems  Constitutional:  Positive for fatigue. Negative for appetite change, fever and unexpected weight change.  HENT:  Negative for congestion, nosebleeds, sneezing, sore throat and trouble swallowing.   Eyes:  Negative for itching and visual disturbance.  Respiratory:  Negative for cough.   Cardiovascular:  Positive for leg swelling. Negative for chest pain and palpitations.   Gastrointestinal:  Negative for abdominal distention, blood in stool, diarrhea and nausea.  Genitourinary:  Negative for frequency and hematuria.  Musculoskeletal:  Positive for back pain and gait problem. Negative for joint swelling and neck pain.  Skin:  Positive for wound. Negative for rash.  Neurological:  Positive for weakness. Negative for dizziness, tremors and speech difficulty.  Psychiatric/Behavioral:  Negative for agitation, dysphoric mood, sleep disturbance and suicidal ideas. The patient is nervous/anxious.    Objective:  BP 130/82 (BP Location: Left Arm)   Pulse 64   Temp 98 F (  36.7 C) (Oral)   Ht 5\' 6"  (1.676 m)   Wt 147 lb 6.4 oz (66.9 kg)   SpO2 96%   BMI 23.79 kg/m   BP Readings from Last 3 Encounters:  01/10/21 130/82  12/27/20 103/64  12/07/20 (!) 98/52    Wt Readings from Last 3 Encounters:  01/10/21 147 lb 6.4 oz (66.9 kg)  12/07/20 142 lb 3.2 oz (64.5 kg)  11/22/20 146 lb 8 oz (66.5 kg)    Physical Exam Constitutional:      General: He is not in acute distress.    Appearance: Normal appearance. He is well-developed. He is not toxic-appearing.     Comments: NAD  Eyes:     Conjunctiva/sclera: Conjunctivae normal.     Pupils: Pupils are equal, round, and reactive to light.  Neck:     Thyroid: No thyromegaly.     Vascular: No JVD.  Cardiovascular:     Rate and Rhythm: Normal rate and regular rhythm.     Heart sounds: Normal heart sounds. No murmur heard.   No friction rub. No gallop.  Pulmonary:     Effort: Pulmonary effort is normal. No respiratory distress.     Breath sounds: Normal breath sounds. No wheezing or rales.  Chest:     Chest wall: No tenderness.  Abdominal:     General: Bowel sounds are normal. There is no distension.     Palpations: Abdomen is soft. There is no mass.     Tenderness: There is no abdominal tenderness. There is no guarding or rebound.  Musculoskeletal:        General: No tenderness. Normal range of motion.      Cervical back: Normal range of motion.  Lymphadenopathy:     Cervical: No cervical adenopathy.  Skin:    General: Skin is warm and dry.     Findings: No rash.  Neurological:     Mental Status: He is alert and oriented to person, place, and time.     Cranial Nerves: No cranial nerve deficit.     Motor: Weakness present. No abnormal muscle tone.     Coordination: Coordination normal.     Gait: Gait abnormal.     Deep Tendon Reflexes: Reflexes are normal and symmetric.  Psychiatric:        Behavior: Behavior normal.        Thought Content: Thought content normal.        Judgment: Judgment normal.  The patient is very weak.  She was able to get up on mild exam table with assistance.   Left LLE-left anterior thigh with x2 wounds: Proximal wound is about 1-1/2 cm in the diameter at about 3 cm deep with serous discharge.  Distal wound is about 1 cm in diameter and about 2 cm deep with serous discharge.  Wounds were cleaned, prepped with Betadine, probed with forceps.  A couple inches of quarter wide you have the form packing gauze was used for she is proximal wound and a couple inches for his distal wound.  Silvadene cream and a nonadhesive dressing.  Sacral area with erythema, no open wounds  Both ankles discolored, flaky with hyperpigmented skin  Using a walker     A total time of 50 minutes was spent preparing to see the patient, reviewing tests, x-rays, operative reports and outside records.  Also, obtaining history and performing comprehensive physical exam.  Additionally, counseling the patient regarding the above listed issues.   Finally, documenting clinical information in the  health records.  I spent a long time doing wound care.  It is a complex case.  Lab Results  Component Value Date   WBC 7.9 12/27/2020   HGB 11.7 (L) 12/27/2020   HCT 37.1 (L) 12/27/2020   PLT 246 12/27/2020   GLUCOSE 87 12/27/2020   CHOL 128 03/10/2015   TRIG 95.0 03/10/2015   HDL 39.30 03/10/2015    LDLCALC 70 03/10/2015   ALT 26 12/21/2020   AST 40 12/21/2020   NA 135 12/27/2020   K 3.8 12/27/2020   CL 96 (L) 12/27/2020   CREATININE 0.74 12/27/2020   BUN 14 12/27/2020   CO2 33 (H) 12/27/2020   TSH 0.680 09/18/2017   PSA 1.95 10/15/2010   INR 2.0 (H) 12/20/2020   HGBA1C 6.0 09/28/2015    DG Chest 1 View  Result Date: 12/20/2020 CLINICAL DATA:  Found on floor. EXAM: CHEST  1 VIEW COMPARISON:  05/26/2020 FINDINGS: Chronic elevation of the right hemidiaphragm. Left Port-A-Cath remains in place with the tip in the SVC. Mild cardiomegaly. No confluent airspace opacities or effusions. Bilateral shoulder replacements. No acute bony abnormality. IMPRESSION: Chronic elevation of the right hemidiaphragm. No acute cardiopulmonary disease. Electronically Signed   By: Charlett Nose M.D.   On: 12/20/2020 10:15   DG Pelvis 1-2 Views  Result Date: 12/20/2020 CLINICAL DATA:  Fall EXAM: PELVIS - 1-2 VIEW COMPARISON:  CT pelvis 07/20/2020 FINDINGS: There is no acute fracture or dislocation. Femoroacetabular alignment is normal. The SI joints and symphysis pubis are intact. There is mild degenerative change of both hips. Lower lumbar spinal fusion hardware is partially imaged. IMPRESSION: No acute fracture or dislocation. Electronically Signed   By: Lesia Hausen M.D.   On: 12/20/2020 10:17   DG Elbow Complete Left  Result Date: 12/20/2020 CLINICAL DATA:  Fall with left elbow pain EXAM: LEFT ELBOW - COMPLETE 3+ VIEW COMPARISON:  None. FINDINGS: There is no evidence of fracture, dislocation, or joint effusion. Generalized osteopenia. IMPRESSION: No acute finding. Electronically Signed   By: Tiburcio Pea M.D.   On: 12/20/2020 10:15   DG Tibia/Fibula Right  Result Date: 12/20/2020 CLINICAL DATA:  Right leg pain after fall. EXAM: RIGHT TIBIA AND FIBULA - 2 VIEW COMPARISON:  None. FINDINGS: Status post right knee and ankle arthroplasty. No fracture or dislocation is noted. IMPRESSION: No acute abnormality  seen. Electronically Signed   By: Lupita Raider M.D.   On: 12/20/2020 10:19   CT Head Wo Contrast  Result Date: 12/20/2020 CLINICAL DATA:  Head trauma, minor (Age >= 65y) Fall. EXAM: CT HEAD WITHOUT CONTRAST TECHNIQUE: Contiguous axial images were obtained from the base of the skull through the vertex without intravenous contrast. COMPARISON:  05/25/2020 FINDINGS: Brain: Chronic microvascular disease throughout the deep white matter, stable since prior study. No acute intracranial abnormality. Specifically, no hemorrhage, hydrocephalus, mass lesion, acute infarction, or significant intracranial injury. Vascular: No hyperdense vessel or unexpected calcification. Skull: No acute calvarial abnormality. Sinuses/Orbits: No acute findings Other: None IMPRESSION: Similar chronic small vessel disease. No acute intracranial abnormality. Electronically Signed   By: Charlett Nose M.D.   On: 12/20/2020 10:29   CT Cervical Spine Wo Contrast  Result Date: 12/20/2020 CLINICAL DATA:  Neck trauma (Age >= 65y).  Fall. EXAM: CT CERVICAL SPINE WITHOUT CONTRAST TECHNIQUE: Multidetector CT imaging of the cervical spine was performed without intravenous contrast. Multiplanar CT image reconstructions were also generated. COMPARISON:  06/04/2016 FINDINGS: Alignment: Degenerative anterolisthesis of C3 on C4, C4 on C5 and  C7 on T1, stable since prior study. Skull base and vertebrae: No acute fracture. No primary bone lesion or focal pathologic process. Soft tissues and spinal canal: No prevertebral fluid or swelling. No visible canal hematoma. Disc levels: Diffuse advanced degenerative disc disease and facet disease. Upper chest: No acute findings Other: None IMPRESSION: Diffuse advanced degenerative disc disease and facet disease. Multilevel degenerative anterolisthesis, stable since prior study. No acute bony abnormality. Electronically Signed   By: Charlett Nose M.D.   On: 12/20/2020 10:32   DG Foot 2 Views Right  Result Date:  12/20/2020 CLINICAL DATA:  Right foot pain after fall. EXAM: RIGHT FOOT - 2 VIEW COMPARISON:  January 29, 2013. FINDINGS: Status post arthroplasty of talotibial joint. Status post surgical fusion of the first and second tarsometatarsal joints. Probable arthroplasty involving the third tarsometatarsal joint. Stable chronic resorption is seen involving the distal portions of the third, fourth and fifth metatarsals. No acute fracture or dislocation is noted. No soft tissue abnormality is noted. IMPRESSION: Chronic postsurgical and degenerative changes are noted as described above. No definite acute abnormality is seen. Electronically Signed   By: Lupita Raider M.D.   On: 12/20/2020 10:18   US Abdomen Limited RUQ (LIVER/GB)  Result Date: 12/20/2020 CLINICAL DATA:  Hyperbilirubinemia EXAM: ULTRASOUND ABDOMEN LIMITED RIGHT UPPER QUADRANT COMPARISON:  CT abdomen/pelvis 10/14/2017, abdominal ultrasound 12/09/2006 FINDINGS: Gallbladder: No gallstones or wall thickening visualized. No sonographic Murphy sign noted by sonographer. Common bile duct: Diameter: 5 mm Liver: Evaluation of the liver is somewhat limited due to body habitus and lack of an acoustic window. Within these confines, no focal lesion identified. Within normal limits in parenchymal echogenicity. Portal vein is patent on color Doppler imaging with normal direction of blood flow towards the liver. Other: None. IMPRESSION: Evaluation of the liver is somewhat limited due to body habitus and lack of an acoustic window. Within these confines, unremarkable right upper quadrant ultrasound. No biliary ductal dilatation identified. Electronically Signed   By: Lesia Hausen M.D.   On: 12/20/2020 15:56    Assessment & Plan:   Problem List Items Addressed This Visit     Frequent falls    Use a walker.  Home PT/OT/RN visits      Malignant melanoma [facility])    Follow-up with oncology.  Continue with wound care      Relevant Medications   ALPRAZolam (XANAX)  0.5 MG tablet   Open thigh wound    There are 2 open wounds.  Wound description and wound care see the note.      Pressure injury of skin    Sacral area is currently with erythema.  No open wounds.  Position change, ambulation advised.      Rheumatoid arthritis (HCC)    Continue with Dilaudid for chronic pain.  Potential benefits of a long term opioids use as well as potential risks (i.e. addiction risk, apnea etc) and complications (i.e. Somnolence, constipation and others) were explained to the patient and were aknowledged.       Relevant Medications   HYDROmorphone (DILAUDID) 2 MG tablet   Other Visit Diagnoses     Needs flu shot    -  Primary   Relevant Orders   Flu Vaccine QUAD High Dose(Fluad) (Completed)         Follow-up: Return in about 2 months (around 03/12/2021) for a follow-up visit.  Sonda Primes, MD

## 2021-01-11 ENCOUNTER — Encounter: Payer: Medicare Other | Admitting: Physical Therapy

## 2021-01-11 ENCOUNTER — Telehealth: Payer: Self-pay | Admitting: Internal Medicine

## 2021-01-11 DIAGNOSIS — R296 Repeated falls: Secondary | ICD-10-CM

## 2021-01-11 DIAGNOSIS — L89321 Pressure ulcer of left buttock, stage 1: Secondary | ICD-10-CM | POA: Diagnosis not present

## 2021-01-11 DIAGNOSIS — B9689 Other specified bacterial agents as the cause of diseases classified elsewhere: Secondary | ICD-10-CM | POA: Diagnosis not present

## 2021-01-11 DIAGNOSIS — L03116 Cellulitis of left lower limb: Secondary | ICD-10-CM | POA: Diagnosis not present

## 2021-01-11 DIAGNOSIS — L03314 Cellulitis of groin: Secondary | ICD-10-CM | POA: Diagnosis not present

## 2021-01-11 DIAGNOSIS — C439 Malignant melanoma of skin, unspecified: Secondary | ICD-10-CM

## 2021-01-11 DIAGNOSIS — S81811D Laceration without foreign body, right lower leg, subsequent encounter: Secondary | ICD-10-CM | POA: Diagnosis not present

## 2021-01-11 DIAGNOSIS — C4372 Malignant melanoma of left lower limb, including hip: Secondary | ICD-10-CM

## 2021-01-11 NOTE — Telephone Encounter (Signed)
[place] Name: [facility] Agency Name: Moreen Fowler Phone #: [phone] Service Requested: OT Frequency of Visits: 1 week 9  Also requesting rx for a 3-in-1 potty chair for patient... please fax to [phone]

## 2021-01-11 NOTE — Telephone Encounter (Signed)
Notified Clair Gulling ok for OT. Printed DME for [employer]. Once MD sign will faxed to [phone].Marland KitchenJohny Chess

## 2021-01-11 NOTE — Telephone Encounter (Signed)
Called Che there was no answer LMOM w/LM response.Marland KitchenJohny Chess

## 2021-01-11 NOTE — Telephone Encounter (Signed)
OK. Thx

## 2021-01-12 DIAGNOSIS — B9689 Other specified bacterial agents as the cause of diseases classified elsewhere: Secondary | ICD-10-CM | POA: Diagnosis not present

## 2021-01-12 DIAGNOSIS — L03314 Cellulitis of groin: Secondary | ICD-10-CM | POA: Diagnosis not present

## 2021-01-12 DIAGNOSIS — L03116 Cellulitis of left lower limb: Secondary | ICD-10-CM | POA: Diagnosis not present

## 2021-01-12 DIAGNOSIS — L89321 Pressure ulcer of left buttock, stage 1: Secondary | ICD-10-CM | POA: Diagnosis not present

## 2021-01-12 DIAGNOSIS — S81811D Laceration without foreign body, right lower leg, subsequent encounter: Secondary | ICD-10-CM | POA: Diagnosis not present

## 2021-01-12 DIAGNOSIS — C439 Malignant melanoma of skin, unspecified: Secondary | ICD-10-CM | POA: Diagnosis not present

## 2021-01-13 NOTE — Assessment & Plan Note (Signed)
Sacral area is currently with erythema.  No open wounds.  Position change, ambulation advised.

## 2021-01-13 NOTE — Assessment & Plan Note (Signed)
Continue with Dilaudid for chronic pain.  Potential benefits of a long term opioids use as well as potential risks (i.e. addiction risk, apnea etc) and complications (i.e. Somnolence, constipation and others) were explained to the patient and were aknowledged.

## 2021-01-13 NOTE — Assessment & Plan Note (Signed)
There are 2 open wounds.  Wound description and wound care see the note.

## 2021-01-13 NOTE — Assessment & Plan Note (Signed)
Use a walker.  Home PT/OT/RN visits

## 2021-01-13 NOTE — Assessment & Plan Note (Signed)
Follow-up with oncology.  Continue with wound care

## 2021-01-15 ENCOUNTER — Inpatient Hospital Stay (HOSPITAL_COMMUNITY): Payer: Medicare Other

## 2021-01-15 ENCOUNTER — Encounter (HOSPITAL_COMMUNITY): Payer: Self-pay | Admitting: Emergency Medicine

## 2021-01-15 ENCOUNTER — Emergency Department (HOSPITAL_COMMUNITY): Payer: Medicare Other

## 2021-01-15 ENCOUNTER — Inpatient Hospital Stay (HOSPITAL_COMMUNITY)
Admission: EM | Admit: 2021-01-15 | Discharge: 2021-01-24 | DRG: 871 | Disposition: A | Discharge status: Skilled Nursing Facility | Payer: Medicare Other | Attending: Internal Medicine | Admitting: Internal Medicine

## 2021-01-15 ENCOUNTER — Other Ambulatory Visit: Payer: Self-pay

## 2021-01-15 ENCOUNTER — Ambulatory Visit (HOSPITAL_COMMUNITY): Payer: Medicare Other

## 2021-01-15 DIAGNOSIS — Z955 Presence of coronary angioplasty implant and graft: Secondary | ICD-10-CM

## 2021-01-15 DIAGNOSIS — I959 Hypotension, unspecified: Secondary | ICD-10-CM | POA: Diagnosis not present

## 2021-01-15 DIAGNOSIS — I69828 Other speech and language deficits following other cerebrovascular disease: Secondary | ICD-10-CM | POA: Diagnosis not present

## 2021-01-15 DIAGNOSIS — R0902 Hypoxemia: Secondary | ICD-10-CM | POA: Diagnosis not present

## 2021-01-15 DIAGNOSIS — Z8249 Family history of ischemic heart disease and other diseases of the circulatory system: Secondary | ICD-10-CM

## 2021-01-15 DIAGNOSIS — Z96611 Presence of right artificial shoulder joint: Secondary | ICD-10-CM | POA: Diagnosis present

## 2021-01-15 DIAGNOSIS — I110 Hypertensive heart disease with heart failure: Secondary | ICD-10-CM | POA: Diagnosis present

## 2021-01-15 DIAGNOSIS — F419 Anxiety disorder, unspecified: Secondary | ICD-10-CM | POA: Diagnosis present

## 2021-01-15 DIAGNOSIS — E872 Acidosis, unspecified: Secondary | ICD-10-CM | POA: Diagnosis present

## 2021-01-15 DIAGNOSIS — I10 Essential (primary) hypertension: Secondary | ICD-10-CM | POA: Diagnosis present

## 2021-01-15 DIAGNOSIS — E876 Hypokalemia: Secondary | ICD-10-CM | POA: Diagnosis not present

## 2021-01-15 DIAGNOSIS — Z96653 Presence of artificial knee joint, bilateral: Secondary | ICD-10-CM | POA: Diagnosis present

## 2021-01-15 DIAGNOSIS — R1312 Dysphagia, oropharyngeal phase: Secondary | ICD-10-CM | POA: Diagnosis not present

## 2021-01-15 DIAGNOSIS — Z66 Do not resuscitate: Secondary | ICD-10-CM | POA: Diagnosis not present

## 2021-01-15 DIAGNOSIS — Z515 Encounter for palliative care: Secondary | ICD-10-CM

## 2021-01-15 DIAGNOSIS — Z881 Allergy status to other antibiotic agents status: Secondary | ICD-10-CM

## 2021-01-15 DIAGNOSIS — R6521 Severe sepsis with septic shock: Secondary | ICD-10-CM | POA: Diagnosis present

## 2021-01-15 DIAGNOSIS — Z9181 History of falling: Secondary | ICD-10-CM | POA: Diagnosis not present

## 2021-01-15 DIAGNOSIS — R0602 Shortness of breath: Secondary | ICD-10-CM | POA: Diagnosis not present

## 2021-01-15 DIAGNOSIS — Z888 Allergy status to other drugs, medicaments and biological substances status: Secondary | ICD-10-CM

## 2021-01-15 DIAGNOSIS — C774 Secondary and unspecified malignant neoplasm of inguinal and lower limb lymph nodes: Secondary | ICD-10-CM | POA: Diagnosis present

## 2021-01-15 DIAGNOSIS — M961 Postlaminectomy syndrome, not elsewhere classified: Secondary | ICD-10-CM | POA: Diagnosis present

## 2021-01-15 DIAGNOSIS — Z8616 Personal history of COVID-19: Secondary | ICD-10-CM | POA: Diagnosis not present

## 2021-01-15 DIAGNOSIS — Z7952 Long term (current) use of systemic steroids: Secondary | ICD-10-CM

## 2021-01-15 DIAGNOSIS — Z923 Personal history of irradiation: Secondary | ICD-10-CM

## 2021-01-15 DIAGNOSIS — Z86718 Personal history of other venous thrombosis and embolism: Secondary | ICD-10-CM

## 2021-01-15 DIAGNOSIS — R2689 Other abnormalities of gait and mobility: Secondary | ICD-10-CM | POA: Diagnosis not present

## 2021-01-15 DIAGNOSIS — I2699 Other pulmonary embolism without acute cor pulmonale: Secondary | ICD-10-CM | POA: Diagnosis present

## 2021-01-15 DIAGNOSIS — S71102D Unspecified open wound, left thigh, subsequent encounter: Secondary | ICD-10-CM | POA: Diagnosis not present

## 2021-01-15 DIAGNOSIS — A419 Sepsis, unspecified organism: Secondary | ICD-10-CM | POA: Diagnosis not present

## 2021-01-15 DIAGNOSIS — G629 Polyneuropathy, unspecified: Secondary | ICD-10-CM | POA: Diagnosis present

## 2021-01-15 DIAGNOSIS — Z20822 Contact with and (suspected) exposure to covid-19: Secondary | ICD-10-CM | POA: Diagnosis not present

## 2021-01-15 DIAGNOSIS — R0600 Dyspnea, unspecified: Secondary | ICD-10-CM

## 2021-01-15 DIAGNOSIS — L0390 Cellulitis, unspecified: Secondary | ICD-10-CM | POA: Diagnosis not present

## 2021-01-15 DIAGNOSIS — J309 Allergic rhinitis, unspecified: Secondary | ICD-10-CM | POA: Diagnosis present

## 2021-01-15 DIAGNOSIS — Z86010 Personal history of colonic polyps: Secondary | ICD-10-CM

## 2021-01-15 DIAGNOSIS — R41841 Cognitive communication deficit: Secondary | ICD-10-CM | POA: Diagnosis not present

## 2021-01-15 DIAGNOSIS — I480 Paroxysmal atrial fibrillation: Secondary | ICD-10-CM | POA: Diagnosis not present

## 2021-01-15 DIAGNOSIS — R609 Edema, unspecified: Secondary | ICD-10-CM | POA: Diagnosis not present

## 2021-01-15 DIAGNOSIS — J9602 Acute respiratory failure with hypercapnia: Secondary | ICD-10-CM | POA: Diagnosis present

## 2021-01-15 DIAGNOSIS — M6281 Muscle weakness (generalized): Secondary | ICD-10-CM | POA: Diagnosis not present

## 2021-01-15 DIAGNOSIS — G9341 Metabolic encephalopathy: Secondary | ICD-10-CM | POA: Diagnosis not present

## 2021-01-15 DIAGNOSIS — I4891 Unspecified atrial fibrillation: Secondary | ICD-10-CM | POA: Diagnosis present

## 2021-01-15 DIAGNOSIS — I5032 Chronic diastolic (congestive) heart failure: Secondary | ICD-10-CM | POA: Diagnosis present

## 2021-01-15 DIAGNOSIS — E785 Hyperlipidemia, unspecified: Secondary | ICD-10-CM | POA: Diagnosis present

## 2021-01-15 DIAGNOSIS — D539 Nutritional anemia, unspecified: Secondary | ICD-10-CM | POA: Diagnosis present

## 2021-01-15 DIAGNOSIS — R262 Difficulty in walking, not elsewhere classified: Secondary | ICD-10-CM | POA: Diagnosis not present

## 2021-01-15 DIAGNOSIS — R2681 Unsteadiness on feet: Secondary | ICD-10-CM | POA: Diagnosis not present

## 2021-01-15 DIAGNOSIS — F32A Depression, unspecified: Secondary | ICD-10-CM | POA: Diagnosis present

## 2021-01-15 DIAGNOSIS — R918 Other nonspecific abnormal finding of lung field: Secondary | ICD-10-CM

## 2021-01-15 DIAGNOSIS — M1990 Unspecified osteoarthritis, unspecified site: Secondary | ICD-10-CM | POA: Diagnosis present

## 2021-01-15 DIAGNOSIS — Z96612 Presence of left artificial shoulder joint: Secondary | ICD-10-CM | POA: Diagnosis present

## 2021-01-15 DIAGNOSIS — M069 Rheumatoid arthritis, unspecified: Secondary | ICD-10-CM | POA: Diagnosis present

## 2021-01-15 DIAGNOSIS — J9601 Acute respiratory failure with hypoxia: Secondary | ICD-10-CM | POA: Diagnosis present

## 2021-01-15 DIAGNOSIS — N179 Acute kidney failure, unspecified: Secondary | ICD-10-CM | POA: Diagnosis present

## 2021-01-15 DIAGNOSIS — Z7901 Long term (current) use of anticoagulants: Secondary | ICD-10-CM

## 2021-01-15 DIAGNOSIS — Z8261 Family history of arthritis: Secondary | ICD-10-CM

## 2021-01-15 DIAGNOSIS — Z83438 Family history of other disorder of lipoprotein metabolism and other lipidemia: Secondary | ICD-10-CM

## 2021-01-15 DIAGNOSIS — Z885 Allergy status to narcotic agent status: Secondary | ICD-10-CM

## 2021-01-15 DIAGNOSIS — L89153 Pressure ulcer of sacral region, stage 3: Secondary | ICD-10-CM | POA: Diagnosis not present

## 2021-01-15 DIAGNOSIS — Z8371 Family history of colonic polyps: Secondary | ICD-10-CM

## 2021-01-15 DIAGNOSIS — C4372 Malignant melanoma of left lower limb, including hip: Secondary | ICD-10-CM | POA: Diagnosis not present

## 2021-01-15 DIAGNOSIS — L03116 Cellulitis of left lower limb: Secondary | ICD-10-CM | POA: Diagnosis present

## 2021-01-15 DIAGNOSIS — J449 Chronic obstructive pulmonary disease, unspecified: Secondary | ICD-10-CM | POA: Diagnosis present

## 2021-01-15 DIAGNOSIS — W19XXXA Unspecified fall, initial encounter: Secondary | ICD-10-CM | POA: Diagnosis not present

## 2021-01-15 DIAGNOSIS — I2510 Atherosclerotic heart disease of native coronary artery without angina pectoris: Secondary | ICD-10-CM | POA: Diagnosis present

## 2021-01-15 DIAGNOSIS — L03115 Cellulitis of right lower limb: Secondary | ICD-10-CM | POA: Diagnosis present

## 2021-01-15 DIAGNOSIS — I252 Old myocardial infarction: Secondary | ICD-10-CM

## 2021-01-15 DIAGNOSIS — I69891 Dysphagia following other cerebrovascular disease: Secondary | ICD-10-CM | POA: Diagnosis not present

## 2021-01-15 DIAGNOSIS — Z880 Allergy status to penicillin: Secondary | ICD-10-CM

## 2021-01-15 DIAGNOSIS — R531 Weakness: Secondary | ICD-10-CM

## 2021-01-15 DIAGNOSIS — T502X5A Adverse effect of carbonic-anhydrase inhibitors, benzothiadiazides and other diuretics, initial encounter: Secondary | ICD-10-CM | POA: Diagnosis not present

## 2021-01-15 DIAGNOSIS — G8929 Other chronic pain: Secondary | ICD-10-CM | POA: Diagnosis present

## 2021-01-15 DIAGNOSIS — Z86711 Personal history of pulmonary embolism: Secondary | ICD-10-CM

## 2021-01-15 DIAGNOSIS — E43 Unspecified severe protein-calorie malnutrition: Secondary | ICD-10-CM | POA: Diagnosis not present

## 2021-01-15 DIAGNOSIS — K219 Gastro-esophageal reflux disease without esophagitis: Secondary | ICD-10-CM | POA: Diagnosis present

## 2021-01-15 DIAGNOSIS — D500 Iron deficiency anemia secondary to blood loss (chronic): Secondary | ICD-10-CM | POA: Diagnosis present

## 2021-01-15 DIAGNOSIS — S71109A Unspecified open wound, unspecified thigh, initial encounter: Secondary | ICD-10-CM | POA: Diagnosis present

## 2021-01-15 DIAGNOSIS — Z808 Family history of malignant neoplasm of other organs or systems: Secondary | ICD-10-CM

## 2021-01-15 DIAGNOSIS — R9431 Abnormal electrocardiogram [ECG] [EKG]: Secondary | ICD-10-CM

## 2021-01-15 DIAGNOSIS — Z7189 Other specified counseling: Secondary | ICD-10-CM | POA: Diagnosis not present

## 2021-01-15 DIAGNOSIS — Z79899 Other long term (current) drug therapy: Secondary | ICD-10-CM

## 2021-01-15 LAB — CBC WITH DIFFERENTIAL/PLATELET
Abs Immature Granulocytes: 0.07 10*3/uL (ref 0.00–0.07)
Abs Immature Granulocytes: 0.07 10*3/uL (ref 0.00–0.07)
Basophils Absolute: 0 10*3/uL (ref 0.0–0.1)
Basophils Absolute: 0 10*3/uL (ref 0.0–0.1)
Basophils Relative: 0 %
Basophils Relative: 0 %
Eosinophils Absolute: 0 10*3/uL (ref 0.0–0.5)
Eosinophils Absolute: 0 10*3/uL (ref 0.0–0.5)
Eosinophils Relative: 0 %
Eosinophils Relative: 0 %
HCT: 33.9 % — ABNORMAL LOW (ref 39.0–52.0)
HCT: 35.5 % — ABNORMAL LOW (ref 39.0–52.0)
Hemoglobin: 10.2 g/dL — ABNORMAL LOW (ref 13.0–17.0)
Hemoglobin: 10.9 g/dL — ABNORMAL LOW (ref 13.0–17.0)
Immature Granulocytes: 1 %
Immature Granulocytes: 0 %
Lymphocytes Relative: 5 %
Lymphocytes Relative: 6 %
Lymphs Abs: 0.8 10*3/uL (ref 0.7–4.0)
Lymphs Abs: 0.9 10*3/uL (ref 0.7–4.0)
MCH: 29.7 pg (ref 26.0–34.0)
MCH: 30.1 pg (ref 26.0–34.0)
MCHC: 30.1 g/dL (ref 30.0–36.0)
MCHC: 30.7 g/dL (ref 30.0–36.0)
MCV: 98.5 fL (ref 80.0–100.0)
MCV: 98.1 fL (ref 80.0–100.0)
Monocytes Absolute: 1.1 10*3/uL — ABNORMAL HIGH (ref 0.1–1.0)
Monocytes Absolute: 1.2 10*3/uL — ABNORMAL HIGH (ref 0.1–1.0)
Monocytes Relative: 7 %
Monocytes Relative: 7 %
Neutro Abs: 13.5 10*3/uL — ABNORMAL HIGH (ref 1.7–7.7)
Neutro Abs: 14 10*3/uL — ABNORMAL HIGH (ref 1.7–7.7)
Neutrophils Relative %: 87 %
Neutrophils Relative %: 87 %
Platelets: 194 10*3/uL (ref 150–400)
Platelets: 189 10*3/uL (ref 150–400)
RBC: 3.44 MIL/uL — ABNORMAL LOW (ref 4.22–5.81)
RBC: 3.62 MIL/uL — ABNORMAL LOW (ref 4.22–5.81)
RDW: 14.6 % (ref 11.5–15.5)
RDW: 14.7 % (ref 11.5–15.5)
WBC: 15.5 10*3/uL — ABNORMAL HIGH (ref 4.0–10.5)
WBC: 16.3 10*3/uL — ABNORMAL HIGH (ref 4.0–10.5)
nRBC: 0 % (ref 0.0–0.2)
nRBC: 0 % (ref 0.0–0.2)

## 2021-01-15 LAB — PROTIME-INR
INR: 2.1 — ABNORMAL HIGH (ref 0.8–1.2)
Prothrombin Time: 23.6 seconds — ABNORMAL HIGH (ref 11.4–15.2)

## 2021-01-15 LAB — COMPREHENSIVE METABOLIC PANEL
ALT: 16 U/L (ref 0–44)
AST: 36 U/L (ref 15–41)
Albumin: 3 g/dL — ABNORMAL LOW (ref 3.5–5.0)
Alkaline Phosphatase: 46 U/L (ref 38–126)
Anion gap: 7 (ref 5–15)
BUN: 44 mg/dL — ABNORMAL HIGH (ref 8–23)
CO2: 37 mmol/L — ABNORMAL HIGH (ref 22–32)
Calcium: 7.4 mg/dL — ABNORMAL LOW (ref 8.9–10.3)
Chloride: 94 mmol/L — ABNORMAL LOW (ref 98–111)
Creatinine, Ser: 2.02 mg/dL — ABNORMAL HIGH (ref 0.61–1.24)
GFR, Estimated: 34 mL/min — ABNORMAL LOW (ref >60)
Glucose, Bld: 119 mg/dL — ABNORMAL HIGH (ref 70–99)
Potassium: 3.6 mmol/L (ref 3.5–5.1)
Sodium: 138 mmol/L (ref 135–145)
Total Bilirubin: 0.9 mg/dL (ref 0.3–1.2)
Total Protein: 5.5 g/dL — ABNORMAL LOW (ref 6.5–8.1)

## 2021-01-15 LAB — APTT: aPTT: 42 seconds — ABNORMAL HIGH (ref 24–36)

## 2021-01-15 LAB — URINALYSIS, ROUTINE W REFLEX MICROSCOPIC
Bilirubin Urine: NEGATIVE
Glucose, UA: NEGATIVE mg/dL
Hgb urine dipstick: NEGATIVE
Ketones, ur: NEGATIVE mg/dL
Leukocytes,Ua: NEGATIVE
Nitrite: NEGATIVE
Protein, ur: NEGATIVE mg/dL
Specific Gravity, Urine: 1.017 (ref 1.005–1.030)
pH: 5 (ref 5.0–8.0)

## 2021-01-15 LAB — RESP PANEL BY RT-PCR (FLU A&B, COVID) ARPGX2
Influenza A by PCR: NEGATIVE
Influenza B by PCR: NEGATIVE
SARS Coronavirus 2 by RT PCR: NEGATIVE

## 2021-01-15 LAB — LACTIC ACID, PLASMA
Lactic Acid, Venous: 2.6 mmol/L (ref 0.5–1.9)
Lactic Acid, Venous: 2.3 mmol/L (ref 0.5–1.9)

## 2021-01-15 MED ORDER — PROCHLORPERAZINE EDISYLATE 10 MG/2ML IJ SOLN
5.0000 mg | INTRAMUSCULAR | Status: DC | PRN
  Administered 2021-01-16 – 2021-01-21 (×2): 5 mg via INTRAVENOUS
  Filled 2021-01-15 (×2): qty 2

## 2021-01-15 MED ORDER — LACTATED RINGERS IV BOLUS
1000.0000 mL | Freq: Once | INTRAVENOUS | Status: AC
  Administered 2021-01-15: 1000 mL via INTRAVENOUS

## 2021-01-15 MED ORDER — LACTATED RINGERS IV SOLN: INTRAVENOUS | Status: DC

## 2021-01-15 MED ORDER — GABAPENTIN 300 MG PO CAPS
600.0000 mg | ORAL_CAPSULE | Freq: Every day | ORAL | Status: DC
  Administered 2021-01-15 – 2021-01-23 (×8): 600 mg via ORAL
  Filled 2021-01-15 (×9): qty 2

## 2021-01-15 MED ORDER — VANCOMYCIN HCL 1250 MG/250ML IV SOLN
1250.0000 mg | Freq: Once | INTRAVENOUS | Status: AC
  Administered 2021-01-15: 1250 mg via INTRAVENOUS
  Filled 2021-01-15: qty 250

## 2021-01-15 MED ORDER — HYDROMORPHONE HCL 2 MG PO TABS
2.0000 mg | ORAL_TABLET | Freq: Three times a day (TID) | ORAL | Status: AC
Start: 2021-01-15 — End: 2021-01-16
  Administered 2021-01-16 (×2): 2 mg via ORAL
  Filled 2021-01-15 (×2): qty 1

## 2021-01-15 MED ORDER — SODIUM CHLORIDE 0.9 % IV SOLN
2.0000 g | Freq: Three times a day (TID) | INTRAVENOUS | Status: DC
  Administered 2021-01-15 – 2021-01-18 (×8): 2 g via INTRAVENOUS
  Filled 2021-01-15 (×11): qty 2

## 2021-01-15 MED ORDER — MIRTAZAPINE 7.5 MG PO TABS
7.5000 mg | ORAL_TABLET | Freq: Every day | ORAL | Status: DC
  Administered 2021-01-15: 7.5 mg via ORAL
  Filled 2021-01-15: qty 1

## 2021-01-15 MED ORDER — LACTATED RINGERS IV BOLUS (SEPSIS)
1000.0000 mL | Freq: Once | INTRAVENOUS | Status: AC
  Administered 2021-01-15: 1000 mL via INTRAVENOUS

## 2021-01-15 MED ORDER — SODIUM CHLORIDE 0.9 % IV SOLN
2.0000 g | Freq: Once | INTRAVENOUS | Status: AC
  Administered 2021-01-15: 2 g via INTRAVENOUS
  Filled 2021-01-15 (×2): qty 2

## 2021-01-15 MED ORDER — VITAMIN B-12 1000 MCG PO TABS
1000.0000 ug | ORAL_TABLET | Freq: Every day | ORAL | Status: DC
  Administered 2021-01-16 – 2021-01-24 (×9): 1000 ug via ORAL
  Filled 2021-01-15 (×9): qty 1

## 2021-01-15 MED ORDER — ACETAMINOPHEN 650 MG RE SUPP: 650.0000 mg | Freq: Four times a day (QID) | RECTAL | Status: DC | PRN

## 2021-01-15 MED ORDER — POLYVINYL ALCOHOL 1.4 % OP SOLN
1.0000 [drp] | OPHTHALMIC | Status: DC | PRN
  Filled 2021-01-15 (×2): qty 15

## 2021-01-15 MED ORDER — ACETAMINOPHEN 325 MG PO TABS
650.0000 mg | ORAL_TABLET | Freq: Four times a day (QID) | ORAL | Status: DC | PRN
  Administered 2021-01-16 – 2021-01-23 (×2): 650 mg via ORAL
  Filled 2021-01-15 (×2): qty 2

## 2021-01-15 MED ORDER — ACETAMINOPHEN 325 MG PO TABS
650.0000 mg | ORAL_TABLET | Freq: Once | ORAL | Status: AC
  Administered 2021-01-15: 650 mg via ORAL
  Filled 2021-01-15: qty 2

## 2021-01-15 MED ORDER — ALPRAZOLAM 0.5 MG PO TABS
0.5000 mg | ORAL_TABLET | Freq: Three times a day (TID) | ORAL | Status: DC | PRN
  Administered 2021-01-16: 0.5 mg via ORAL
  Filled 2021-01-15: qty 1

## 2021-01-15 MED ORDER — VANCOMYCIN HCL IN DEXTROSE 1-5 GM/200ML-% IV SOLN: 1000.0000 mg | Freq: Once | INTRAVENOUS | Status: DC

## 2021-01-15 MED ORDER — ALBUTEROL SULFATE HFA 108 (90 BASE) MCG/ACT IN AERS: 1.0000 | INHALATION_SPRAY | RESPIRATORY_TRACT | Status: DC | PRN

## 2021-01-15 MED ORDER — VANCOMYCIN VARIABLE DOSE PER UNSTABLE RENAL FUNCTION (PHARMACIST DOSING): Status: DC

## 2021-01-15 MED ORDER — METRONIDAZOLE 500 MG/100ML IV SOLN
500.0000 mg | Freq: Once | INTRAVENOUS | Status: AC
  Administered 2021-01-15: 500 mg via INTRAVENOUS
  Filled 2021-01-15: qty 100

## 2021-01-15 MED ORDER — MELATONIN 5 MG PO TABS
10.0000 mg | ORAL_TABLET | Freq: Every day | ORAL | Status: DC
  Administered 2021-01-15: 10 mg via ORAL
  Filled 2021-01-15: qty 2

## 2021-01-15 MED ORDER — APIXABAN 5 MG PO TABS
5.0000 mg | ORAL_TABLET | Freq: Two times a day (BID) | ORAL | Status: DC
  Administered 2021-01-15 – 2021-01-24 (×17): 5 mg via ORAL
  Filled 2021-01-15 (×5): qty 1
  Filled 2021-01-15: qty 2
  Filled 2021-01-15: qty 1
  Filled 2021-01-15: qty 2
  Filled 2021-01-15 (×11): qty 1

## 2021-01-15 MED ORDER — FLUOXETINE HCL 20 MG PO CAPS
40.0000 mg | ORAL_CAPSULE | Freq: Every day | ORAL | Status: DC
  Administered 2021-01-15 – 2021-01-21 (×7): 40 mg via ORAL
  Filled 2021-01-15 (×7): qty 2

## 2021-01-15 MED ORDER — ALBUTEROL SULFATE (2.5 MG/3ML) 0.083% IN NEBU
2.5000 mg | INHALATION_SOLUTION | RESPIRATORY_TRACT | Status: DC | PRN
  Administered 2021-01-16: 2.5 mg via RESPIRATORY_TRACT
  Filled 2021-01-15: qty 3

## 2021-01-15 MED ORDER — VITAMIN B-6 50 MG PO TABS
50.0000 mg | ORAL_TABLET | Freq: Every day | ORAL | Status: DC
  Administered 2021-01-16 – 2021-01-24 (×8): 50 mg via ORAL
  Filled 2021-01-15 (×9): qty 1

## 2021-01-15 MED ORDER — LACTATED RINGERS IV BOLUS (SEPSIS)
250.0000 mL | Freq: Once | INTRAVENOUS | Status: AC
  Administered 2021-01-15: 250 mL via INTRAVENOUS

## 2021-01-15 MED ORDER — TEMAZEPAM 15 MG PO CAPS: 15.0000 mg | ORAL_CAPSULE | Freq: Every evening | ORAL | Status: DC | PRN

## 2021-01-15 MED ORDER — FENTANYL CITRATE PF 50 MCG/ML IJ SOSY
50.0000 ug | PREFILLED_SYRINGE | Freq: Once | INTRAMUSCULAR | Status: AC
  Administered 2021-01-15: 50 ug via INTRAVENOUS
  Filled 2021-01-15: qty 1

## 2021-01-15 MED ORDER — PANTOPRAZOLE SODIUM 40 MG PO TBEC
40.0000 mg | DELAYED_RELEASE_TABLET | Freq: Every evening | ORAL | Status: DC
  Administered 2021-01-15 – 2021-01-24 (×9): 40 mg via ORAL
  Filled 2021-01-15 (×9): qty 1

## 2021-01-15 MED ORDER — PREDNISONE 5 MG PO TABS
15.0000 mg | ORAL_TABLET | Freq: Every day | ORAL | Status: DC
  Filled 2021-01-15: qty 1

## 2021-01-15 NOTE — ED Notes (Signed)
Lactic 2.6, Zenia Resides Md notified via phone of results.

## 2021-01-15 NOTE — H&P (Signed)
History and Physical    [name] BJS:[id number] DOB: 1946-01-05 DOA: 01/15/2021  PCP: Cassandria Anger, MD  Patient coming from: SNF.  I have personally briefly reviewed patient's old medical records in [place]  Chief Complaint: Generalized weakness.  HPI: [name] is a 75 y.o. male with medical history significant of alcohol abuse, allergic rhinitis, anxiety, CAD, chronic diastolic heart failure, chronic back pain, COPD, depression, diverticulosis, history of DVT and PE, history of single episode of A. fib, hemochromatosis, GERD, frequent falls, hyperlipidemia, chronic blood loss anemia, narcotic abuse history, history of neuropathy, osteoarthritis, rheumatoid arthritis with brought to the emergency department via EMS from [facility] due to increased generalized weakness and multiple wounds on LLE from metastatic melanoma.  He is somnolent, but easily arousable, oriented x3 he is stated that he did not sleep much last night.  He complains of chills, but no night sweats.  No rhinorrhea, sore throat, wheezing or hemoptysis.  He has chronic dyspnea.  Denied chest pain, palpitations, diaphoresis, PND, orthopnea or pitting edema of the lower extremities.  Denied abdominal pain, nausea, emesis, diarrhea, melena or hematochezia.  He gets occasionally constipated.  No flank pain, dysuria, frequency or hematuria.  ED Course: Initial vital signs were temperature Initial vital signs were temperature 103.2 F, pulse 79, respirations 26, BP 99/81 mmHg O2 sat 96% on room air.  The patient received 2250 mL of LR bolus, acetaminophen 650 mg p.o., metronidazole 500 mg IVPB and vancomycin.  Lab work: CBC showed a white count of 15.5, hemoglobin 10.2 g/dL and platelets 194.  PT was 2.1, INR 23.6 and PTT 42.  Lactic acid was 2.6 and then 2.3 mmol/L.  CMP showed chloride 94 CO2 37 mmol/L.  Calcium 7.4 mg/dL corrected to an albumin of 3.0 g/dL with a 44 mg/dL.  BUN  was 44, creatinine 2.02 and glucose 119 mg/dL.  Review of Systems: As per HPI otherwise all other systems reviewed and are negative.  Past Medical History:  Diagnosis Date   Alcoholism /alcohol abuse    per family   Allergic rhinitis    Allergy    Anxiety    Bacterial infection    CAD (coronary artery disease)    minimal coronary plaque in the LAD and right coronary system. 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 7-06. Last nuclear stress 11-17-06 fixed anterior/ inferior defect, no inducible ischemia, EF 81%   Cataract    CHF (congestive heart failure) (HCC)    Chronic back pain    "all over back"   Chronic neck pain    COPD (chronic obstructive pulmonary disease) ([place])    Depression    Diverticulosis    DVT (deep venous thrombosis) ([place]) 06/2020   Dyspnea    with exertion   Dysrhythmia 01/24/2012   past hx. A.Fib x1 episode-responded to med.   Falls frequently    "since 02/2013" (06/16/2013)   Family history of cancer    Genetic testing 03/06/2017   Multi-Cancer panel (83 genes) @ [facility] - No pathogenic mutations detected   GERD (gastroesophageal reflux disease)    Goals of care, counseling/discussion 10/12/2020   Hemochromatosis    dx'd 14 yrs ago last ferritin Aug 11, 08 52 (22-322), Fe 136 ("I had 250 phlebotomies for that")   High cholesterol    hx   History of radiation therapy 11/06/2020   left inguinal thigh 10/10/2020-11/06/2020  Dr Gery Pray   Hx  of colonic polyps    Hx of colonoscopy    Hypertension    Iron deficiency anemia due to chronic blood loss 12/27/2016   Malignant melanoma of knee, left (HCC)    Myocardial infarction [facility]) 2006   "related to catheterization"   Narcotic abuse ([place])    per family   Neuropathy    fingers and toes   Osteoarthritis    Peripheral edema    Bilateral ankles   PONV (postoperative nausea and vomiting)    Pulmonary embolism ([place]) 06/2020   RA (rheumatoid  arthritis) ([place])    Past Surgical History:  Procedure Laterality Date   ABDOMINAL ADHESION SURGERY  ~ [place] Right 6-09   [facility]   APPENDECTOMY  ~ [place] Left 03/27/2017   Procedure: ASPIRATION OF SEROMA;  Surgeon: Stark Klein, MD;  Location: [place];  Service: General;  Laterality: Left;   BONE TUMOR RESECTION  ~ 1954   "taken off my mastoid"   [place] Right 1990's   CATARACT EXTRACTION, BILATERAL Bilateral 01-24-12   CORONARY ANGIOPLASTY WITH STENT PLACEMENT  2006   "while repairing 1st stent, a second area tore and they had to place 2nd stent " ?LAD & CX   CORONARY ANGIOPLASTY WITH STENT PLACEMENT  06/17/2014   CYST EXCISION  "several OR's"   "backX 2, back of my neck, face, inside right bicept, chest, wrist"   FOOT SURGERY Right 11-08   for removal of bone spurs-   HAMMER TOE SURGERY Right 07/2012   "broke 4 hammertoes"    HARDWARE REMOVAL  03/09/2012   Procedure: HARDWARE REMOVAL;  Surgeon: Nita Sells, MD;  Location: [place];  Service: Orthopedics;  Laterality: Left;  Hardware Removal from Left Shoulder   HARVEST BONE GRAFT  02/06/2012   Procedure: HARVEST ILIAC BONE GRAFT;  Surgeon: Nita Sells, MD;  Location[facility];  Service: Orthopedics;;  bone marrow aspirqation    INGUINAL HERNIA REPAIR Bilateral    JOINT REPLACEMENT     KNEE SURGERY Left ~ 2003   "6-12 months after uni knee removed synovial sack"   LEFT HEART CATHETERIZATION WITH CORONARY ANGIOGRAM N/A 06/17/2014   PCI of diffuse severe stenosis in the proximal to mid LAD using overlapping drug-eluting stents.   LUMBAR WOUND DEBRIDEMENT N/A 03/17/2013   Procedure: Incision and drainage of superficial lumbar wound;  Surgeon: Floyce Stakes, MD;  Location: [place] [facility];  Service: Neurosurgery;  Laterality: N/A;  Incision and drainage of superficial lumbar wound   LYMPH NODE BIOPSY N/A 08/15/2020    Procedure: EXCISIONAL LYMPH NODE BIOPSY LEFT INGUINAL REGION, EXCISION OF LEFT THIGH NODULE, EXCISION OF LEFT ABDOMINAL WALL LESION;  Surgeon: Stark Klein, MD;  Location[facility];  Service: General;  Laterality: N/A;   MECKEL DIVERTICULUM EXCISION  ~ [place] LYMPH NODE BIOPSY Left 02/12/2017   WIDE LOCAL EXCISION LEFT KNEE MELANOMA, ADVANCEMENT FLAP CLOSURE, AND SENTINEL LYMPH NODE MAPPING AND BIOPSY.   MELANOMA EXCISION WITH SENTINEL LYMPH NODE BIOPSY Left 02/12/2017   Procedure: WIDE LOCAL EXCISION LEFT KNEE MELANOMA, ADVANCEMENT FLAP CLOSURE, AND SENTINEL LYMPH NODE MAPPING AND BIOPSY.;  Surgeon: Stark Klein, MD;  Location: [place];  Service: General;  Laterality: Left;  GENERAL AND LOCAL   ORIF SHOULDER FRACTURE  02/06/2012   Procedure: OPEN REDUCTION INTERNAL FIXATION (ORIF) SHOULDER FRACTURE;  Surgeon: Nita Sells, MD;  Location[facility];  Service:  Orthopedics;  Laterality: Left;  ORIF of a Left Shoulder Fracture with  I[place] Bone Graft aspiration    PORTACATH PLACEMENT Left 03/27/2017   Procedure: INSERTION PORT-A-CATH;  Surgeon: Stark Klein, MD;  Location: [place];  Service: General;  Laterality: Left;   POSTERIOR LUMBAR FUSION  12-10   L4-5 diskectomy w/ fusion, cage placement and rods; [place]   POSTERIOR LUMBAR FUSION 4 LEVEL N/A 03/02/2013   Procedure: Lumbar One to Sacral One Posterior lumbar interbody fusion;  Surgeon: Floyce Stakes, MD;  Location: [place] [facility];  Service: Neurosurgery;  Laterality: N/A;  L1 to S1 Posterior lumbar interbody fusion   REPLACEMENT UNICONDYLAR JOINT KNEE Left ~ 2003   "~ 6 months after total knee replaced"   SHOULDER ARTHROSCOPY Left ~ 2004 X 2   "@ [facility]; left bone splinter in & had to clean it out"   TOTAL ANKLE REPLACEMENT Right 2008   at [facility] Bilateral 2002   TOTAL SHOULDER REPLACEMENT Left 2006   TOTAL SHOULDER REPLACEMENT Right ~ 2007   Dr. Marlou Sa    Social History  reports that he has never smoked. He has never used smokeless tobacco. He reports that he does not drink alcohol and does not use drugs.  Allergies  Allergen Reactions   Cefepime Hives and Shortness Of Breath   Cephaeline Hives   Morphine Swelling    A swollen stomach.   Morphine And Related Shortness Of Breath, Nausea And Vomiting, Swelling and Other (See Comments)    Agitation, tolerates dilaudid Other reaction(s): Other (See Comments) Agitation, tolerates dilaudid   Penicillins Hives, Shortness Of Breath and Rash    Has patient had a PCN reaction causing immediate rash, facial/tongue/throat swelling, SOB or lightheadedness with hypotension: Yes Has patient had a PCN reaction causing severe rash involving mucus membranes or skin necrosis: Yes Has patient had a PCN reaction that required hospitalization No Has patient had a PCN reaction occurring within the last 10 years: No If all of the above answers are "NO", then may proceed with Cephalosporin use.    Doxycycline Rash   Oxycodone-Acetaminophen Other (See Comments)    Patient doesn't remember what type of reaction.   Family History  Problem Relation Age of Onset   Uterine cancer Mother        uterine vs. cervix? dx 18s; deceased 15   Macular degeneration Mother    Other Mother        ankle edema   Lung cancer Mother    Melanoma Mother        multiple spots on legs   Cancer - Lung Mother        smoker   Coronary artery disease Father    Hypertension Father    Prostate cancer Father    Colon polyps Father    Cancer - Prostate Father 66       deceased 70   Heart attack Brother    Hyperlipidemia Brother    Other Brother        Schizophrenic   Thyroid disease Sister    Hemochromatosis Sister    Rheum arthritis Sister    Coronary artery disease Maternal Aunt    Heart attack Maternal Aunt    Cancer Maternal Grandmother        unk. type; deceased 19s   Diabetes type II Neg Hx    Colon cancer Neg  Hx    Esophageal cancer Neg Hx    Rectal cancer  Neg Hx    Stomach cancer Neg Hx    Prior to Admission medications   Medication Sig Start Date End Date Taking? Authorizing Provider  albuterol (PROVENTIL HFA;VENTOLIN HFA) 108 (90 Base) MCG/ACT inhaler Inhale 1-2 puffs into the lungs every 4 (four) hours as needed for wheezing or shortness of breath. 09/11/16   Plotnikov, Evie Lacks, MD  ALPRAZolam Duanne Moron) 0.5 MG tablet Take 1 tablet (0.5 mg total) by mouth 3 (three) times daily as needed for anxiety. for anxiety 01/10/21   Plotnikov, Evie Lacks, MD  apixaban (ELIQUIS) 5 MG TABS tablet Take 1 tablet (5 mg total) by mouth 2 (two) times daily. 06/06/20 12/20/20  Plotnikov, Evie Lacks, MD  Cholecalciferol (VITAMIN D3) 2000 UNITS capsule Take 1 capsule (2,000 Units total) by mouth daily. 12/05/14   Plotnikov, Evie Lacks, MD  FLUoxetine (PROZAC) 20 MG capsule Take 1 capsule (20 mg total) by mouth daily for 5 days. 12/26/20 12/31/20  Eugenie Filler, MD  FLUoxetine (PROZAC) 40 MG capsule Take 1 capsule (40 mg total) by mouth daily. 01/01/21   Eugenie Filler, MD  fluticasone (FLONASE) 50 MCG/ACT nasal spray INSTILL 2 SPRAYS IN EACH NOSTRIL EVERY DAY Patient taking differently: Place 2 sprays into both nostrils daily as needed for allergies. 11/19/17   Plotnikov, Evie Lacks, MD  gabapentin (NEURONTIN) 300 MG capsule Take 2 capsules (600 mg total) by mouth at bedtime. 11/02/20   Plotnikov, Evie Lacks, MD  HYDROmorphone (DILAUDID) 2 MG tablet Take 1-1.5 tablets (2-3 mg total) by mouth every 8 (eight) hours as needed for severe pain. 01/10/21   Plotnikov, Evie Lacks, MD  lidocaine-prilocaine (EMLA) cream Apply 1 application topically as needed. 05/25/20   Celso Amy, NP  Melatonin 10 MG TABS Take 10 mg by mouth at bedtime.    [provider]  metoprolol tartrate (LOPRESSOR) 25 MG tablet TAKE 1 TABLET TWO TIMES DAILY. Patient taking differently: Take 25 mg by mouth 2 (two) times daily. 11/06/20   Plotnikov,  Evie Lacks, MD  mirtazapine (REMERON) 7.5 MG tablet Take 7.5 mg by mouth at bedtime. 01/05/21   [provider]  mupirocin ointment (BACTROBAN) 2 % On leg wound w/dressing change qd or bid 11/02/20   Plotnikov, Evie Lacks, MD  pantoprazole (PROTONIX) 40 MG tablet Take 1 tablet (40 mg total) by mouth every evening. Patient taking differently: Take 40 mg by mouth daily as needed (indigestion). 06/19/20   Plotnikov, Evie Lacks, MD  polyvinyl alcohol (LIQUIFILM TEARS) 1.4 % ophthalmic solution Place 1 drop into both eyes as needed for dry eyes.    [provider]  predniSONE (DELTASONE) 5 MG tablet Take 15 mg by mouth daily. 06/19/20   [provider]  Pyridoxine HCl (B-6 PO) Take 1 tablet by mouth daily.    [provider]  temazepam (RESTORIL) 15 MG capsule Take 1 capsule (15 mg total) by mouth at bedtime as needed for sleep. 10/12/20   [name], MD  torsemide (DEMADEX) 100 MG tablet Take 0.5-1 tablets (50-100 mg total) by mouth daily as needed. Patient taking differently: Take 50-100 mg by mouth daily as needed (fluid). 07/05/20   Plotnikov, Evie Lacks, MD  vitamin B-12 (CYANOCOBALAMIN) 1000 MCG tablet Take 1,000 mcg by mouth daily.     [provider]    Physical Exam: Vitals:   01/15/21 1445 01/15/21 1453 01/15/21 1454 01/15/21 1456  BP: (!) 91/59     Pulse: 95 94 92   Resp: (!) 22 20 19  Temp:    98.5 F (36.9 C)  TempSrc:    Oral  SpO2: 92% (!) 89% 93%     Constitutional: Chronically ill-appearing.  NAD, calm, comfortable Eyes: PERRL, lids and conjunctivae normal.  Mildly injected sclera. ENMT: Mucous membranes are mildly dry.  Posterior pharynx clear of any exudate or lesions. Neck: normal, supple, no masses, no thyromegaly Respiratory: Shallow inspiration, otherwise clear to auscultation bilaterally, no wheezing, no crackles. Normal respiratory effort. No accessory muscle use.  Cardiovascular: Regular rate and rhythm, no murmurs / rubs /  gallops. No extremity edema. 2+ pedal pulses. No carotid bruits.  Abdomen: No distention.  Bowel sounds positive.  Soft, no tenderness, no masses palpated. No hepatosplenomegaly. Musculoskeletal: no clubbing / cyanosis.  Good ROM, no contractures. Normal muscle tone.  Skin: Multiple lesions on lower extremities.  He has 2 packed wounds on his medial left thigh and 2 more wounds on RLE.  Please see pictures below. Neurologic: CN 2-12 grossly intact. Sensation intact, DTR normal. Strength 5/5 in all 4.  Psychiatric: Normal judgment and insight. Alert and oriented x 3. Normal mood.            Labs on Admission: I have personally reviewed following labs and imaging studies  CBC: Recent Labs  Lab 01/15/21 1057 01/15/21 1231  WBC 15.5* 16.3*  NEUTROABS 13.5* 14.0*  HGB 10.2* 10.9*  HCT 33.9* 35.5*  MCV 98.5 98.1  PLT 194 546    Basic Metabolic Panel: Recent Labs  Lab 01/15/21 1057  NA 138  K 3.6  CL 94*  CO2 37*  GLUCOSE 119*  BUN 44*  CREATININE 2.02*  CALCIUM 7.4*    GFR: Estimated Creatinine Clearance: 29.4 mL/min (A) (by C-G formula based on SCr of 2.02 mg/dL (H)).  Liver Function Tests: Recent Labs  Lab 01/15/21 1057  AST 36  ALT 16  ALKPHOS 46  BILITOT 0.9  PROT 5.5*  ALBUMIN 3.0*   Radiological Exams on Admission: DG Chest 2 View  Result Date: 01/15/2021 CLINICAL DATA:  Weakness, fall. Suspected sepsis. History of metastatic melanoma EXAM: CHEST - 2 VIEW COMPARISON:  12/22/2020 FINDINGS: Unchanged positioning of left-sided Port-A-Cath. Stable heart size. Aortic atherosclerosis. Low lung volumes with chronically elevated right hemidiaphragm. Unchanged blunting of the left costophrenic angle, may represent scarring versus small effusion. There are a few small nodular densities within the left lung which are new from prior. No pneumothorax. IMPRESSION: There are a few small nodular densities within the left lung, new from prior. Findings may reflect areas  of infection or inflammation although pulmonary nodules are not excluded given history of cancer. Recommend short-term follow-up chest radiograph to ensure resolution. Electronically Signed   By: Davina Poke D.O.   On: 01/15/2021 12:13    EKG: Independently reviewed.  Vent. rate 105 BPM PR interval 188 ms QRS duration 83 ms QT/QTcB 420/556 ms P-R-T axes 84 122 -43 Sinus tachycardia Probable right ventricular hypertrophy Borderline T abnormalities, inferior leads Prolonged QT interval  Assessment/Plan Principal Problem:   Sepsis due to cellulitis POA (HCC) Admit to PCU with/inpatient. Continue IV fluids. Continue vancomycin per pharmacy. Continue aztreonam per pharmacy. Follow blood culture and sensitivity. Follow-up CBC and renal function.  Active Problems:   AKI (acute kidney injury)  Continue IV fluids. Monitor intake and output. Avoid hypotension. Avoid nephrotoxic medications. Follow-up renal function electrolytes.    Anxiety Continue fluoxetine 40 mg p.o. daily. Continue alprazolam 0.5 mg p.o. TID PRN.    Essential hypertension Hold antihypertensives at this  time. Monitor blood pressure.    Coronary atherosclerosis On apixaban. Hold beta-blocker.    Paroxysmal atrial fibrillation (HCC) CHA?DS?-VASc Score of at least 3. Continue apixaban 5 mg p.o. twice daily. Metoprolol has been held due to soft blood pressures.    COPD (chronic obstructive pulmonary disease) (HCC) Continue supplemental oxygen via [place]. Bronchodilators as needed.    Lumbar post-laminectomy syndrome The patient is somnolent at this time. Will hold oral hydromorphone.    Pulmonary embolus (HCC) On apixaban.    Open thigh wound Consult wound care.    Pulmonary nodules on x-ray. Well need short-term imaging follow-up.     DVT prophylaxis: : Apixaban. Code Status:   Full code. Family Communication:   Disposition Plan:   Patient is from:  Assisted living.  Anticipated DC  to:  Assisted living.  Anticipated DC date:  01/17/2021 or 01/18/2021.  Anticipated DC barriers: Clinical status. Consults called:   Admission status:  Inpatient/PCU.    Severity of Illness:High severity after presenting with sepsis in the setting of RLE cellulitis.  The patient will remain in the hospital for 48 to 72 hours for IV antibiotic therapy.  Reubin Milan MD [facility]  How to contact the [facility] Attending or Consulting provider [place] or covering provider during after hours [place], for this patient?   Check the care team in [facility] and look for a) attending/consulting [facility] provider listed and b) the [facility] team listed Log into [email or web address] and use [place]'s universal password to access. If you do not have the password, please contact the hospital operator. Locate the [facility] provider you are looking for under [facility] and page to a number that you can be directly reached. If you still have difficulty reaching the provider, please page the [facility] (Director on Call) for the Hospitalists listed on amion for assistance.  01/15/2021, 4:01 PM   This document was prepared using Dragon voice recognition software may contain some unintended transcription errors.

## 2021-01-15 NOTE — Progress Notes (Signed)
A consult was received from an ED physician for aztreonam and vancomycin per pharmacy dosing.  The patient's profile has been reviewed for ht/wt/allergies/indication/available labs.  Allergies to antibiotics include cefepime, cephaeline, and penicillins.  A one time order has been placed for vancomycin 1250 mg IV.  An order for aztreonam 2 g IV entered by provider has been verified (dose appropriate). Further antibiotics/pharmacy consults should be ordered by admitting physician if indicated.                       Thank you, Suzzanne Cloud, PharmD, BCPS 01/15/2021  1:33 PM

## 2021-01-15 NOTE — Sepsis Progress Note (Signed)
ELink monitoring sepsis protocol

## 2021-01-15 NOTE — ED Notes (Signed)
Pt given urinal.

## 2021-01-15 NOTE — Progress Notes (Signed)
Pharmacy Antibiotic Note  [name] is a 75 y.o. male admitted on 01/15/2021 with LE wound infection.  Pharmacy has been consulted for Vancomycin & Aztreonam dosing. Noted severe allergy to Cefepime, Penicillins with hives, ShOB; last Cefepime charted 2015.  Admit SCr elevated at 2.02, prev SCr 0.74 on 9/21, anticipate will improve with hydration, treatment  Plan: Vancomycin 1271m x1 in ED, then Aztreonam 2g q8  Height: 5' 6"  (167.6 cm) Weight: 66.9 kg (147 lb 6.4 oz) IBW/kg (Calculated) : 63.8  Temp (24hrs), Avg:100.9 F (38.3 C), Min:98.5 F (36.9 C), Max:103.2 F (39.6 C)  Recent Labs  Lab 01/15/21 1057 01/15/21 1231 01/15/21 1318  WBC 15.5* 16.3*  --   CREATININE 2.02*  --   --   LATICACIDVEN 2.6*  --  2.3*    Estimated Creatinine Clearance: 29.4 mL/min (A) (by C-G formula based on SCr of 2.02 mg/dL (H)).    Allergies  Allergen Reactions   Cefepime Hives and Shortness Of Breath   Cephaeline Hives   Morphine Swelling    A swollen stomach.   Morphine And Related Shortness Of Breath, Nausea And Vomiting, Swelling and Other (See Comments)    Agitation, tolerates dilaudid Other reaction(s): Other (See Comments) Agitation, tolerates dilaudid   Penicillins Hives, Shortness Of Breath and Rash    Has patient had a PCN reaction causing immediate rash, facial/tongue/throat swelling, SOB or lightheadedness with hypotension: Yes Has patient had a PCN reaction causing severe rash involving mucus membranes or skin necrosis: Yes Has patient had a PCN reaction that required hospitalization No Has patient had a PCN reaction occurring within the last 10 years: No If all of the above answers are "NO", then may proceed with Cephalosporin use.    Doxycycline Rash   Oxycodone-Acetaminophen Other (See Comments)    Patient doesn't remember what type of reaction.    Antimicrobials this admission: 10/10 Flagyl x1  10/10 Vancomycin >>  10/10 Aztreonam >>  Dose adjustments  this admission:  Microbiology results: 10/10 BCx: sent UCx ordered  Thank you for allowing pharmacy to be a part of this patient's care.  GRemer MachoD 01/15/2021 7:12 PM

## 2021-01-15 NOTE — ED Provider Notes (Signed)
[facility] DEPT Provider Note   CSN: [id number] Arrival date & time: 01/15/21  1032     History No chief complaint on file.   [name] is a 75 y.o. male.  75 year old male presents with weakness x2 days.  Patient notes that he is chronically short of breath and this is unchanged.  Denies any urinary symptoms.  Does have some erythema and swelling to his right lower extremity.  Has chronic wounds.  Denies any abdominal or chest discomfort.  Per EMS, patient was noted to have blood pressure of 90/60.  Got 150 cc of bolus.  Was noted to be febrile.  History of metastatic melanoma.  Blood sugar was 185      Past Medical History:  Diagnosis Date   Alcoholism /alcohol abuse    per family   Allergic rhinitis    Allergy    Anxiety    Bacterial infection    CAD (coronary artery disease)    minimal coronary plaque in the LAD and right coronary system. 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 7-06. Last nuclear stress 11-17-06 fixed anterior/ inferior defect, no inducible ischemia, EF 81%   Cataract    CHF (congestive heart failure) (HCC)    Chronic back pain    "all over back"   Chronic neck pain    COPD (chronic obstructive pulmonary disease) ([place])    Depression    Diverticulosis    DVT (deep venous thrombosis) ([place]) 06/2020   Dyspnea    with exertion   Dysrhythmia 01/24/2012   past hx. A.Fib x1 episode-responded to med.   Falls frequently    "since 02/2013" (06/16/2013)   Family history of cancer    Genetic testing 03/06/2017   Multi-Cancer panel (83 genes) @ [facility] - No pathogenic mutations detected   GERD (gastroesophageal reflux disease)    Goals of care, counseling/discussion 10/12/2020   Hemochromatosis    dx'd 14 yrs ago last ferritin Aug 11, 08 52 (22-322), Fe 136 ("I had 250 phlebotomies for that")   High cholesterol    hx   History of radiation therapy  11/06/2020   left inguinal thigh 10/10/2020-11/06/2020  Dr Gery Pray   Hx of colonic polyps    Hx of colonoscopy    Hypertension    Iron deficiency anemia due to chronic blood loss 12/27/2016   Malignant melanoma of knee, left (HCC)    Myocardial infarction [facility]) 2006   "related to catheterization"   Narcotic abuse ([place])    per family   Neuropathy    fingers and toes   Osteoarthritis    Peripheral edema    Bilateral ankles   PONV (postoperative nausea and vomiting)    Pulmonary embolism ([place]) 06/2020   RA (rheumatoid arthritis) ([place])     Patient Active Problem List   Diagnosis Date Noted   Open thigh wound    Citrobacter infection    Pseudomonas infection    Pressure injury of skin 12/21/2020   Fall    Hyperbilirubinemia    Malignant melanoma ([place])    Cellulitis 12/20/2020   Blisters of multiple sites 11/05/2020   Goals of care, counseling/discussion 10/12/2020   Atherosclerosis of aorta ([place]) 07/24/2020   Symptomatic anemia 05/27/2020   Pulmonary embolus ([place]) 05/27/2020   Acute respiratory failure with hypoxia ([place]) 05/26/2020   Callus of foot 04/21/2018   Edema 03/27/2018   Toxic gastroenteritis and colitis 03/25/2018   Other ulcerative  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 ([place]) 70/62/3762   Diarrhea 12/23/2017   Nausea & vomiting 12/23/2017   Herpes zoster 07/07/2017   Constipation 04/21/2017   Genetic testing 03/06/2017   Family history of cancer    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[place]) 02/12/2017   Iron deficiency anemia due to chronic blood loss 12/27/2016   Scalp wound 06/13/2016   Scalp laceration, subsequent encounter 06/04/2016   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   Pain in joint, ankle and foot 09/28/2015   Anemia due to chronic blood loss 09/28/2015   Hyponatremia 09/28/2015   Cramp in limb 09/28/2015   Exertional angina ([place]) 06/17/2014   DOE (dyspnea on exertion) 04/28/2014   Hypokalemia 03/11/2014   Counseling regarding goals of care  12/31/2013   Impaired glucose tolerance 12/31/2013   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[place]) 11/18/2013   Fever 11/18/2013   Leukocytosis 10/30/2013   Fatigue 10/22/2013   Hoarseness of voice 08/10/2013   Sleep disorder 07/05/2013   Tension headache 07/01/2013   Polypharmacy 06/19/2013   Lumbar post-laminectomy syndrome 05/27/2013   Degenerative arthritis of spine 05/26/2013   Hemochromatosis 05/26/2013   Wound infection after surgery 05/26/2013   Normocytic anemia 05/26/2013   Unintentional weight loss 05/26/2013   Protein-calorie malnutrition, severe ([place]) 05/25/2013   Chronic pain syndrome 05/23/2013   COPD (chronic obstructive pulmonary disease) ([place]) 05/06/2013   Frequent falls 05/06/2013   Encounter for postoperative wound care 05/06/2013   Dehydration 03/17/2013   Atrial fibrillation ([place]) 08/07/2010   Rheumatoid arthritis ([place]) 10/18/2008   Anxiety 11/09/2007   Elevated lipids 03/30/2007   PSEUDOGOUT 03/30/2007   Essential hypertension 03/30/2007   Coronary atherosclerosis 03/30/2007    Past Surgical History:  Procedure Laterality Date   ABDOMINAL ADHESION SURGERY  ~ [place] Right 6-09   [facility]   APPENDECTOMY  ~ [place] Left 03/27/2017   Procedure: ASPIRATION OF SEROMA;  Surgeon: Stark Klein, MD;  Location: [facility];  Service: General;  Laterality: Left;   BONE TUMOR RESECTION  ~ 1954   "taken off my mastoid"   CARPAL TUNNEL RELEASE Right 1990's   CATARACT EXTRACTION, BILATERAL Bilateral 01-24-12   CORONARY ANGIOPLASTY WITH STENT PLACEMENT  2006   "while repairing 1st stent, a second area tore and they had to place 2nd stent " ?LAD & CX   CORONARY ANGIOPLASTY WITH STENT PLACEMENT  06/17/2014   CYST EXCISION  "several OR's"   "backX 2, back of my neck, face, inside right bicept, chest, wrist"   FOOT SURGERY Right 11-08   for removal of bone spurs-   HAMMER TOE SURGERY Right 07/2012   "broke 4 hammertoes"     HARDWARE REMOVAL  03/09/2012   Procedure: HARDWARE REMOVAL;  Surgeon: Nita Sells, MD;  Location: [facility];  Service: Orthopedics;  Laterality: Left;  Hardware Removal from Left Shoulder   HARVEST BONE GRAFT  02/06/2012   Procedure: HARVEST ILIAC BONE GRAFT;  Surgeon: Nita Sells, MD;  Location: [facility];  Service: Orthopedics;;  bone marrow aspirqation    INGUINAL HERNIA REPAIR Bilateral    JOINT REPLACEMENT     KNEE SURGERY Left ~ 2003   "6-12 months after uni knee removed synovial sack"   LEFT HEART CATHETERIZATION WITH CORONARY ANGIOGRAM N/A 06/17/2014   PCI of diffuse severe stenosis in the proximal to mid LAD using overlapping drug-eluting stents.   LUMBAR WOUND DEBRIDEMENT N/A 03/17/2013   Procedure: Incision and drainage of superficial  lumbar wound;  Surgeon: Floyce Stakes, MD;  Location: [facility];  Service: Neurosurgery;  Laterality: N/A;  Incision and drainage of superficial lumbar wound   LYMPH NODE BIOPSY N/A 08/15/2020   Procedure: EXCISIONAL LYMPH NODE BIOPSY LEFT INGUINAL REGION, EXCISION OF LEFT THIGH NODULE, EXCISION OF LEFT ABDOMINAL WALL LESION;  Surgeon: Stark Klein, MD;  Location: [facility];  Service: General;  Laterality: N/A;   MECKEL DIVERTICULUM EXCISION  ~ [place] LYMPH NODE BIOPSY Left 02/12/2017   WIDE LOCAL EXCISION LEFT KNEE MELANOMA, ADVANCEMENT FLAP CLOSURE, AND SENTINEL LYMPH NODE MAPPING AND BIOPSY.   MELANOMA EXCISION WITH SENTINEL LYMPH NODE BIOPSY Left 02/12/2017   Procedure: WIDE LOCAL EXCISION LEFT KNEE MELANOMA, ADVANCEMENT FLAP CLOSURE, AND SENTINEL LYMPH NODE MAPPING AND BIOPSY.;  Surgeon: Stark Klein, MD;  Location: [facility];  Service: General;  Laterality: Left;  GENERAL AND LOCAL   ORIF SHOULDER FRACTURE  02/06/2012   Procedure: OPEN REDUCTION INTERNAL FIXATION (ORIF) SHOULDER FRACTURE;  Surgeon: Nita Sells, MD;  Location: [facility];  Service: Orthopedics;  Laterality:  Left;  ORIF of a Left Shoulder Fracture with  Iliac Crest Bone Graft aspiration    PORTACATH PLACEMENT Left 03/27/2017   Procedure: INSERTION PORT-A-CATH;  Surgeon: Stark Klein, MD;  Location: [facility];  Service: General;  Laterality: Left;   POSTERIOR LUMBAR FUSION  12-10   L4-5 diskectomy w/ fusion, cage placement and rods; [place]   POSTERIOR LUMBAR FUSION 4 LEVEL N/A 03/02/2013   Procedure: Lumbar One to Sacral One Posterior lumbar interbody fusion;  Surgeon: Floyce Stakes, MD;  Location: [facility];  Service: Neurosurgery;  Laterality: N/A;  L1 to S1 Posterior lumbar interbody fusion   REPLACEMENT UNICONDYLAR JOINT KNEE Left ~ 2003   "~ 6 months after total knee replaced"   SHOULDER ARTHROSCOPY Left ~ 2004 X 2   "@ [facility]; left bone splinter in & had to clean it out"   TOTAL ANKLE REPLACEMENT Right 2008   at [place] Bilateral 2002   TOTAL SHOULDER REPLACEMENT Left 2006   TOTAL SHOULDER REPLACEMENT Right ~ 2007   Dr. Marlou Sa       Family History  Problem Relation Age of Onset   Uterine cancer Mother        uterine vs. cervix? dx 68s; deceased 80   Macular degeneration Mother    Other Mother        ankle edema   Lung cancer Mother    Melanoma Mother        multiple spots on legs   Cancer - Lung Mother        smoker   Coronary artery disease Father    Hypertension Father    Prostate cancer Father    Colon polyps Father    Cancer - Prostate Father 44       deceased 57   Heart attack Brother    Hyperlipidemia Brother    Other Brother        Schizophrenic   Thyroid disease Sister    Hemochromatosis Sister    Rheum arthritis Sister    Coronary artery disease Maternal Aunt    Heart attack Maternal Aunt    Cancer Maternal Grandmother        unk. type; deceased 25s   Diabetes Neg Hx    Colon cancer Neg Hx    Esophageal cancer Neg Hx    Rectal cancer Neg Hx  Stomach cancer Neg Hx     Social History   Tobacco Use    Smoking status: Never   Smokeless tobacco: Never  Vaping Use   Vaping Use: Never used  Substance Use Topics   Alcohol use: No    Alcohol/week: 0.0 standard drinks    Comment: "I do not drink anymore"   Drug use: No    Home Medications Prior to Admission medications   Medication Sig Start Date End Date Taking? Authorizing Provider  albuterol (PROVENTIL HFA;VENTOLIN HFA) 108 (90 Base) MCG/ACT inhaler Inhale 1-2 puffs into the lungs every 4 (four) hours as needed for wheezing or shortness of breath. 09/11/16   Plotnikov, Evie Lacks, MD  ALPRAZolam Duanne Moron) 0.5 MG tablet Take 1 tablet (0.5 mg total) by mouth 3 (three) times daily as needed for anxiety. for anxiety 01/10/21   Plotnikov, Evie Lacks, MD  apixaban (ELIQUIS) 5 MG TABS tablet Take 1 tablet (5 mg total) by mouth 2 (two) times daily. 06/06/20 12/20/20  Plotnikov, Evie Lacks, MD  Cholecalciferol (VITAMIN D3) 2000 UNITS capsule Take 1 capsule (2,000 Units total) by mouth daily. 12/05/14   Plotnikov, Evie Lacks, MD  FLUoxetine (PROZAC) 20 MG capsule Take 1 capsule (20 mg total) by mouth daily for 5 days. 12/26/20 12/31/20  Eugenie Filler, MD  FLUoxetine (PROZAC) 40 MG capsule Take 1 capsule (40 mg total) by mouth daily. 01/01/21   Eugenie Filler, MD  fluticasone (FLONASE) 50 MCG/ACT nasal spray INSTILL 2 SPRAYS IN EACH NOSTRIL EVERY DAY Patient taking differently: Place 2 sprays into both nostrils daily as needed for allergies. 11/19/17   Plotnikov, Evie Lacks, MD  gabapentin (NEURONTIN) 300 MG capsule Take 2 capsules (600 mg total) by mouth at bedtime. 11/02/20   Plotnikov, Evie Lacks, MD  HYDROmorphone (DILAUDID) 2 MG tablet Take 1-1.5 tablets (2-3 mg total) by mouth every 8 (eight) hours as needed for severe pain. 01/10/21   Plotnikov, Evie Lacks, MD  lidocaine-prilocaine (EMLA) cream Apply 1 application topically as needed. 05/25/20   Celso Amy, NP  Melatonin 10 MG TABS Take 10 mg by mouth at bedtime.    [provider]  metoprolol  tartrate (LOPRESSOR) 25 MG tablet TAKE 1 TABLET TWO TIMES DAILY. Patient taking differently: Take 25 mg by mouth 2 (two) times daily. 11/06/20   Plotnikov, Evie Lacks, MD  mupirocin ointment (BACTROBAN) 2 % On leg wound w/dressing change qd or bid 11/02/20   Plotnikov, Evie Lacks, MD  pantoprazole (PROTONIX) 40 MG tablet Take 1 tablet (40 mg total) by mouth every evening. Patient taking differently: Take 40 mg by mouth daily as needed (indigestion). 06/19/20   Plotnikov, Evie Lacks, MD  polyvinyl alcohol (LIQUIFILM TEARS) 1.4 % ophthalmic solution Place 1 drop into both eyes as needed for dry eyes.    [provider]  predniSONE (DELTASONE) 5 MG tablet Take 15 mg by mouth daily. 06/19/20   [provider]  Pyridoxine HCl (B-6 PO) Take 1 tablet by mouth daily.    [provider]  temazepam (RESTORIL) 15 MG capsule Take 1 capsule (15 mg total) by mouth at bedtime as needed for sleep. 10/12/20   [name], MD  torsemide (DEMADEX) 100 MG tablet Take 0.5-1 tablets (50-100 mg total) by mouth daily as needed. Patient taking differently: Take 50-100 mg by mouth daily as needed (fluid). 07/05/20   Plotnikov, Evie Lacks, MD  vitamin B-12 (CYANOCOBALAMIN) 1000 MCG tablet Take 1,000 mcg by mouth daily.  [provider]    Allergies    Cefepime, Cephaeline, Morphine, Morphine and related, Penicillins, Doxycycline, and Oxycodone-acetaminophen  Review of Systems   Review of Systems  All other systems reviewed and are negative.  Physical Exam Updated Vital Signs BP 99/81   Pulse (!) 107   Temp (!) 103.2 F (39.6 C) (Rectal)   Resp (!) 32   SpO2 100%   Physical Exam Vitals and nursing note reviewed.  Constitutional:      General: He is not in acute distress.    Appearance: Normal appearance. He is well-developed. He is not toxic-appearing.  HENT:     Head: Normocephalic and atraumatic.  Eyes:     General: Lids are normal.     Conjunctiva/sclera: Conjunctivae  normal.     Pupils: Pupils are equal, round, and reactive to light.  Neck:     Thyroid: No thyroid mass.     Trachea: No tracheal deviation.  Cardiovascular:     Rate and Rhythm: Regular rhythm. Bradycardia present.     Heart sounds: Normal heart sounds. No murmur heard.   No gallop.  Pulmonary:     Effort: Pulmonary effort is normal. No respiratory distress.     Breath sounds: Normal breath sounds. No stridor. No decreased breath sounds, wheezing, rhonchi or rales.  Abdominal:     General: There is no distension.     Palpations: Abdomen is soft.     Tenderness: There is no abdominal tenderness. There is no rebound.  Musculoskeletal:        General: No tenderness. Normal range of motion.     Cervical back: Normal range of motion and neck supple.       Legs:  Skin:    General: Skin is warm and dry.     Findings: No abrasion or rash.  Neurological:     Mental Status: He is alert and oriented to person, place, and time. Mental status is at baseline.     GCS: GCS eye subscore is 4. GCS verbal subscore is 5. GCS motor subscore is 6.     Cranial Nerves: Cranial nerves are intact. No cranial nerve deficit.     Sensory: No sensory deficit.     Motor: Motor function is intact.  Psychiatric:        Attention and Perception: Attention normal.        Speech: Speech normal.        Behavior: Behavior normal.    ED Results / Procedures / Treatments   Labs (all labs ordered are listed, but only abnormal results are displayed) Labs Reviewed  CBC WITH DIFFERENTIAL/PLATELET - Abnormal; Notable for the following components:      Result Value   WBC 15.5 (*)    RBC 3.44 (*)    Hemoglobin 10.2 (*)    HCT 33.9 (*)    Neutro Abs 13.5 (*)    Monocytes Absolute 1.1 (*)    All other components within normal limits  PROTIME-INR - Abnormal; Notable for the following components:   Prothrombin Time 23.6 (*)    INR 2.1 (*)    All other components within normal limits  CULTURE, BLOOD (ROUTINE X 2)   CULTURE, BLOOD (ROUTINE X 2)  RESP PANEL BY RT-PCR (FLU A&B, COVID) ARPGX2  URINE CULTURE  COMPREHENSIVE METABOLIC PANEL  LACTIC ACID, PLASMA  LACTIC ACID, PLASMA  URINALYSIS, ROUTINE W REFLEX MICROSCOPIC  CBC WITH DIFFERENTIAL/PLATELET  APTT    EKG None  Radiology No results found.  Procedures Procedures   Medications Ordered in ED Medications  lactated ringers infusion (has no administration in time range)  lactated ringers bolus 1,000 mL (has no administration in time range)    And  lactated ringers bolus 1,000 mL (has no administration in time range)    And  lactated ringers bolus 250 mL (has no administration in time range)    ED Course  I have reviewed the triage vital signs and the nursing notes.  Pertinent labs & imaging results that were available during my care of the patient were reviewed by me and considered in my medical decision making (see chart for details).    MDM Rules/Calculators/A&P                           Patient hypotensive he responded well to fluids.  CBC shows leukocytosis.  Has evidence of acute kidney injury here with creatinine of 2.  Has a lactic acidosis.  Patient given 30 cc/kg bolus of lactated Ringer's and started on IV antibiotics and will be admitted to the hospital  CRITICAL CARE Performed by: Leota Jacobsen Total critical care time: 55 minutes Critical care time was exclusive of separately billable procedures and treating other patients. Critical care was necessary to treat or prevent imminent or life-threatening deterioration. Critical care was time spent personally by me on the following activities: development of treatment plan with patient and/or surrogate as well as nursing, discussions with consultants, evaluation of patient's response to treatment, examination of patient, obtaining history from patient or surrogate, ordering and performing treatments and interventions, ordering and review of laboratory studies, ordering  and review of radiographic studies, pulse oximetry and re-evaluation of patient's condition.  Final Clinical Impression(s) / ED Diagnoses Final diagnoses:  None    Rx / DC Orders ED Discharge Orders     None        Lacretia Leigh, MD 01/15/21 1312

## 2021-01-15 NOTE — ED Triage Notes (Signed)
BIB EMS from [facility], had a fall earlier this morning due to increased weakness, no injuries from fall. BP was 90/60 w/ EMS, got to 80/palp, has gotten 150 cc NS IV. Facility has been giving lasix due to swelling in his feet. Uses walker at baseline. RA sat was low w/ EMS, placed on 2 L [place]. Reports multiple open wounds from melanoma that metastasized.   CBG 185 w. EMS

## 2021-01-16 ENCOUNTER — Inpatient Hospital Stay (HOSPITAL_COMMUNITY): Payer: Medicare Other

## 2021-01-16 ENCOUNTER — Telehealth: Payer: Self-pay | Admitting: Internal Medicine

## 2021-01-16 ENCOUNTER — Encounter: Payer: Medicare Other | Admitting: Rehabilitation

## 2021-01-16 DIAGNOSIS — I480 Paroxysmal atrial fibrillation: Secondary | ICD-10-CM | POA: Diagnosis not present

## 2021-01-16 DIAGNOSIS — N179 Acute kidney failure, unspecified: Secondary | ICD-10-CM

## 2021-01-16 DIAGNOSIS — J449 Chronic obstructive pulmonary disease, unspecified: Secondary | ICD-10-CM

## 2021-01-16 DIAGNOSIS — L0390 Cellulitis, unspecified: Secondary | ICD-10-CM

## 2021-01-16 DIAGNOSIS — A419 Sepsis, unspecified organism: Secondary | ICD-10-CM | POA: Diagnosis present

## 2021-01-16 DIAGNOSIS — I10 Essential (primary) hypertension: Secondary | ICD-10-CM

## 2021-01-16 LAB — CBC WITH DIFFERENTIAL/PLATELET
Abs Immature Granulocytes: 0.11 10*3/uL — ABNORMAL HIGH (ref 0.00–0.07)
Basophils Absolute: 0 10*3/uL (ref 0.0–0.1)
Basophils Relative: 0 %
Eosinophils Absolute: 0 10*3/uL (ref 0.0–0.5)
Eosinophils Relative: 0 %
HCT: 33.7 % — ABNORMAL LOW (ref 39.0–52.0)
Hemoglobin: 10 g/dL — ABNORMAL LOW (ref 13.0–17.0)
Immature Granulocytes: 1 %
Lymphocytes Relative: 4 %
Lymphs Abs: 0.5 10*3/uL — ABNORMAL LOW (ref 0.7–4.0)
MCH: 30.4 pg (ref 26.0–34.0)
MCHC: 29.7 g/dL — ABNORMAL LOW (ref 30.0–36.0)
MCV: 102.4 fL — ABNORMAL HIGH (ref 80.0–100.0)
Monocytes Absolute: 0.9 10*3/uL (ref 0.1–1.0)
Monocytes Relative: 6 %
Neutro Abs: 13.1 10*3/uL — ABNORMAL HIGH (ref 1.7–7.7)
Neutrophils Relative %: 89 %
Platelets: 169 10*3/uL (ref 150–400)
RBC: 3.29 MIL/uL — ABNORMAL LOW (ref 4.22–5.81)
RDW: 14.6 % (ref 11.5–15.5)
WBC: 14.7 10*3/uL — ABNORMAL HIGH (ref 4.0–10.5)
nRBC: 0.1 % (ref 0.0–0.2)

## 2021-01-16 LAB — CREATININE, SERUM
Creatinine, Ser: 1.23 mg/dL (ref 0.61–1.24)
GFR, Estimated: >60 mL/min (ref >60)

## 2021-01-16 LAB — BLOOD GAS, ARTERIAL
Acid-base deficit: 0.2 mmol/L (ref 0.0–2.0)
Bicarbonate: 28.6 mmol/L — ABNORMAL HIGH (ref 20.0–28.0)
FIO2: 36
O2 Saturation: 99.1 %
Patient temperature: 98.6
pCO2 arterial: 74.6 mmHg (ref 32.0–48.0)
pH, Arterial: 7.208 — ABNORMAL LOW (ref 7.350–7.450)
pO2, Arterial: 180 mmHg — ABNORMAL HIGH (ref 83.0–108.0)

## 2021-01-16 LAB — BLOOD GAS, VENOUS
Acid-base deficit: 0.3 mmol/L (ref 0.0–2.0)
Bicarbonate: 27.9 mmol/L (ref 20.0–28.0)
O2 Saturation: 54.3 %
Patient temperature: 98.6
pCO2, Ven: 67.9 mmHg — ABNORMAL HIGH (ref 44.0–60.0)
pH, Ven: 7.237 — ABNORMAL LOW (ref 7.250–7.430)
pO2, Ven: 32.8 mmHg (ref 32.0–45.0)

## 2021-01-16 LAB — URINE CULTURE

## 2021-01-16 LAB — MRSA NEXT GEN BY PCR, NASAL: MRSA by PCR Next Gen: NOT DETECTED

## 2021-01-16 MED ORDER — BUDESONIDE 0.25 MG/2ML IN SUSP
0.2500 mg | Freq: Two times a day (BID) | RESPIRATORY_TRACT | Status: DC
  Administered 2021-01-17: 0.25 mg via RESPIRATORY_TRACT
  Filled 2021-01-16: qty 2

## 2021-01-16 MED ORDER — IPRATROPIUM-ALBUTEROL 0.5-2.5 (3) MG/3ML IN SOLN
3.0000 mL | Freq: Three times a day (TID) | RESPIRATORY_TRACT | Status: DC
  Administered 2021-01-17 – 2021-01-24 (×22): 3 mL via RESPIRATORY_TRACT
  Filled 2021-01-16 (×23): qty 3

## 2021-01-16 MED ORDER — ARFORMOTEROL TARTRATE 15 MCG/2ML IN NEBU
15.0000 ug | INHALATION_SOLUTION | Freq: Two times a day (BID) | RESPIRATORY_TRACT | Status: DC
  Administered 2021-01-17: 15 ug via RESPIRATORY_TRACT
  Filled 2021-01-16: qty 2

## 2021-01-16 MED ORDER — FOLIC ACID 1 MG PO TABS
1.0000 mg | ORAL_TABLET | Freq: Every day | ORAL | Status: DC
  Administered 2021-01-17 – 2021-01-24 (×8): 1 mg via ORAL
  Filled 2021-01-16 (×8): qty 1

## 2021-01-16 MED ORDER — ARFORMOTEROL TARTRATE 15 MCG/2ML IN NEBU: 15.0000 ug | INHALATION_SOLUTION | Freq: Two times a day (BID) | RESPIRATORY_TRACT | Status: DC

## 2021-01-16 MED ORDER — MOMETASONE FURO-FORMOTEROL FUM 200-5 MCG/ACT IN AERO
2.0000 | INHALATION_SPRAY | Freq: Two times a day (BID) | RESPIRATORY_TRACT | Status: DC
  Filled 2021-01-16: qty 8.8

## 2021-01-16 MED ORDER — SODIUM CHLORIDE 0.9 % IV BOLUS
500.0000 mL | Freq: Once | INTRAVENOUS | Status: AC
  Administered 2021-01-16: 500 mL via INTRAVENOUS

## 2021-01-16 MED ORDER — BUDESONIDE 0.25 MG/2ML IN SUSP: 0.2500 mg | Freq: Two times a day (BID) | RESPIRATORY_TRACT | Status: DC

## 2021-01-16 MED ORDER — METHYLPREDNISOLONE SODIUM SUCC 125 MG IJ SOLR
125.0000 mg | Freq: Once | INTRAMUSCULAR | Status: AC
  Administered 2021-01-16: 125 mg via INTRAVENOUS
  Filled 2021-01-16: qty 2

## 2021-01-16 MED ORDER — CHLORHEXIDINE GLUCONATE CLOTH 2 % EX PADS
6.0000 | MEDICATED_PAD | Freq: Every day | CUTANEOUS | Status: DC
  Administered 2021-01-16 – 2021-01-24 (×8): 6 via TOPICAL

## 2021-01-16 MED ORDER — CHLORHEXIDINE GLUCONATE 0.12 % MT SOLN
15.0000 mL | Freq: Two times a day (BID) | OROMUCOSAL | Status: DC
  Administered 2021-01-17 – 2021-01-24 (×14): 15 mL via OROMUCOSAL
  Filled 2021-01-16 (×13): qty 15

## 2021-01-16 MED ORDER — SODIUM CHLORIDE 0.9 % IV SOLN: INTRAVENOUS | Status: DC

## 2021-01-16 MED ORDER — VANCOMYCIN HCL IN DEXTROSE 1-5 GM/200ML-% IV SOLN
1000.0000 mg | INTRAVENOUS | Status: DC
  Administered 2021-01-16: 1000 mg via INTRAVENOUS
  Filled 2021-01-16: qty 200

## 2021-01-16 MED ORDER — SODIUM CHLORIDE 0.9 % IV SOLN
250.0000 mL | INTRAVENOUS | Status: DC
  Administered 2021-01-16 – 2021-01-18 (×2): 250 mL via INTRAVENOUS

## 2021-01-16 MED ORDER — ORAL CARE MOUTH RINSE
15.0000 mL | Freq: Two times a day (BID) | OROMUCOSAL | Status: DC
  Administered 2021-01-17 – 2021-01-24 (×7): 15 mL via OROMUCOSAL

## 2021-01-16 MED ORDER — IPRATROPIUM-ALBUTEROL 0.5-2.5 (3) MG/3ML IN SOLN
3.0000 mL | RESPIRATORY_TRACT | Status: DC
  Administered 2021-01-16 (×3): 3 mL via RESPIRATORY_TRACT
  Filled 2021-01-16 (×3): qty 3

## 2021-01-16 MED ORDER — NALOXONE HCL 0.4 MG/ML IJ SOLN
0.4000 mg | Freq: Once | INTRAMUSCULAR | Status: AC
  Administered 2021-01-16: 0.4 mg via INTRAVENOUS
  Filled 2021-01-16: qty 1

## 2021-01-16 MED ORDER — NOREPINEPHRINE 4 MG/250ML-% IV SOLN
2.0000 ug/min | INTRAVENOUS | Status: DC
  Administered 2021-01-16: 2 ug/min via INTRAVENOUS
  Filled 2021-01-16: qty 250

## 2021-01-16 NOTE — Progress Notes (Signed)
[name] ID: [name], male   DOB: Jun 25, 1945, 75 y.o.   MRN: [id number]  PROGRESS NOTE    [name]  HKV:[id number] DOB: [birth year] DOA: 01/15/2021 PCP: Cassandria Anger, MD   Brief Narrative:  75 y.o. male with medical history significant of alcohol abuse, allergic rhinitis, anxiety, CAD, chronic diastolic heart failure, chronic back pain, COPD, depression, diverticulosis, history of DVT and PE, history of single episode of A. fib, hemochromatosis, GERD, frequent falls, hyperlipidemia, chronic blood loss anemia, narcotic abuse history, history of neuropathy, osteoarthritis, rheumatoid arthritis presented from SNF weakness and multiple wounds on lower extremity.  On presentation, temperature was 103.2 with tachypnea.  White count was 15.5, lactic acid was 2.6 and then 2.3, creatinine of 2.02.  He was started on broad-spectrum antibiotics.  Assessment & Plan:   Severe sepsis: Present on admission Lower extremity cellulitis -Presented with fever, tachypnea, elevated lactic acid and leukocytosis with evidence of lower extremity cellulitis -Cellulitis more evident on the right lower extremity. -On broad-spectrum antibiotics.  Continue.  COVID-19 and influenza testing were negative on presentation. -Blood pressure on the lower side but stable.  Still slightly tachycardic.  Acute kidney injury -Presented with creatinine of 2.02.  Resolved.  Creatinine 1.23 today.  Treated with IV fluids  Leukocytosis -Improving.  Monitor  Macrocytic anemia -Questionable cause.  Hemoglobin stable.  Continue B12 supplementation.  Add empiric folic acid supplementation.  Essential hypertension -Blood pressure on the lower side.  Antihypertensives on hold  Paroxysmal A. fib -Currently intermittently tachycardic.  Metoprolol on hold.  Once blood pressure improves, will have to resume metoprolol.  Continue Eliquis  COPD Acute hypoxic respiratory failure -Stable.  Continue  bronchodilators as needed.  Required 4 L oxygen via nasal cannula on presentation.  Wean off as able. -Add Dulera  History of pulmonary embolism -Continue Eliquis  Lower extremity wounds: Present on admission -Follow wound care recommendations  ?  Pulmonary nodules on x-ray -We will need follow-up as an outpatient with PCP/pulmonary  Coronary atherosclerosis -Continue apixaban.  Beta-blocker on hold  Anxiety -Continue fluoxetine along with as needed alprazolam  Generalized deconditioning -PT eval  DVT prophylaxis:  Code Status:   Family Communication:  Disposition Plan: Status is: Inpatient  Remains inpatient appropriate because:Inpatient level of care appropriate due to severity of illness  Dispo: The patient is from: SNF              Anticipated d/c is to: SNF              Patient currently is not medically stable to d/c.   Difficult to place patient No  Consultants: None  Procedures: None  Antimicrobials: Azactam and vancomycin from 01/15/2021 onwards   Subjective: Patient seen and examined at bedside.  Poor historian, feels weak and complains of lower extremity pain.  Feels slightly better.  No overnight fever, vomiting, worsening shortness of breath reported.  Objective: Vitals:   01/16/21 0911 01/16/21 0930 01/16/21 1000 01/16/21 1030  BP:  97/61 (!) 103/49 (!) 89/62  Pulse:  (!) 118 (!) 117 (!) 112  Resp:  (!) 23 (!) 23 12  Temp: (!) 100.8 F (38.2 C)     TempSrc: Rectal     SpO2:  98% 96% 97%  Weight:      Height:        Intake/Output Summary (Last 24 hours) at 01/16/2021 1050 Last data filed at 01/16/2021 1033 Gross per 24 hour  Intake 4898.11 ml  Output --  Net 4898.11 ml  Filed Weights   01/15/21 1700  Weight: 66.9 kg    Examination:  General exam: Appears calm and comfortable.  Looks chronically ill and deconditioned.  Currently on 4 L oxygen via nasal cannula. Respiratory system: Bilateral decreased breath sounds at bases with  some scattered crackles and intermittently tachypneic Cardiovascular system: S1 & S2 heard, tachycardic  gastrointestinal system: Abdomen is distended slightly, soft and nontender. Normal bowel sounds heard. Extremities: No cyanosis, clubbing; bilateral lower extremity edema present  Central nervous system: Alert and oriented.  Slow to respond.  Poor historian.  No focal neurological deficits. Moving extremities Skin: Bilateral lower extremity chronic skin changes with erythema and tenderness with wounds in the left thigh with dressing and right lower extremity skin tear Psychiatry: Affect is mostly flat, intermittently gets anxious   Data Reviewed: I have personally reviewed following labs and imaging studies  CBC: Recent Labs  Lab 01/15/21 1057 01/15/21 1231 01/16/21 0612  WBC 15.5* 16.3* 14.7*  NEUTROABS 13.5* 14.0* 13.1*  HGB 10.2* 10.9* 10.0*  HCT 33.9* 35.5* 33.7*  MCV 98.5 98.1 102.4*  PLT 194 189 203   Basic Metabolic Panel: Recent Labs  Lab 01/15/21 1057 01/16/21 0612  NA 138  --   K 3.6  --   CL 94*  --   CO2 37*  --   GLUCOSE 119*  --   BUN 44*  --   CREATININE 2.02* 1.23  CALCIUM 7.4*  --    GFR: Estimated Creatinine Clearance: 48.3 mL/min (by C-G formula based on SCr of 1.23 mg/dL). Liver Function Tests: Recent Labs  Lab 01/15/21 1057  AST 36  ALT 16  ALKPHOS 46  BILITOT 0.9  PROT 5.5*  ALBUMIN 3.0*   No results for input(s): LIPASE, AMYLASE in the last 168 hours. No results for input(s): AMMONIA in the last 168 hours. Coagulation Profile: Recent Labs  Lab 01/15/21 1057  INR 2.1*   Cardiac Enzymes: No results for input(s): CKTOTAL, CKMB, CKMBINDEX, TROPONINI in the last 168 hours. BNP (last 3 results) No results for input(s): PROBNP in the last 8760 hours. HbA1C: No results for input(s): HGBA1C in the last 72 hours. CBG: No results for input(s): GLUCAP in the last 168 hours. Lipid Profile: No results for input(s): CHOL, HDL, LDLCALC,  TRIG, CHOLHDL, LDLDIRECT in the last 72 hours. Thyroid Function Tests: No results for input(s): TSH, T4TOTAL, FREET4, T3FREE, THYROIDAB in the last 72 hours. Anemia Panel: No results for input(s): VITAMINB12, FOLATE, FERRITIN, TIBC, IRON, RETICCTPCT in the last 72 hours. Sepsis Labs: Recent Labs  Lab 01/15/21 1057 01/15/21 1318  LATICACIDVEN 2.6* 2.3*    Recent Results (from the past 240 hour(s))  Culture, blood (Routine x 2)     Status: None (Preliminary result)   Collection Time: 01/15/21 10:57 AM   Specimen: BLOOD  Result Value Ref Range Status   Specimen Description   Final    BLOOD LEFT ANTECUBITAL Performed at [facility] [address], [place], [place] [address]    Special Requests   Final    BOTTLES DRAWN AEROBIC AND ANAEROBIC Blood Culture adequate volume Performed at [facility] [address], [place], [place] [address]    Culture   Final    NO GROWTH < 24 HOURS Performed at [facility] [address], [place], [place] [address]    Report Status PENDING  Incomplete  Culture, blood (Routine x 2)     Status: None (Preliminary result)   Collection  Time: 01/15/21 12:31 PM   Specimen: BLOOD  Result Value Ref Range Status   Specimen Description   Final    BLOOD SITE NOT SPECIFIED Performed at [facility] [address] [place], [place] [address]    Special Requests   Final    BOTTLES DRAWN AEROBIC AND ANAEROBIC Blood Culture adequate volume Performed at [facility] [address], [place], [place] [address]    Culture   Final    NO GROWTH < 24 HOURS Performed at [facility] [address] [place], [place] [address]    Report Status PENDING  Incomplete  Resp Panel by RT-PCR (Flu A&B, Covid)     Status: None   Collection Time: 01/15/21 12:31 PM   Specimen: Nasopharyngeal(NP) swabs in vial transport medium  Result Value Ref Range Status   SARS Coronavirus 2 by  RT PCR NEGATIVE NEGATIVE Final    Comment: (NOTE) SARS-CoV-2 target nucleic acids are NOT DETECTED.  The SARS-CoV-2 RNA is generally detectable in upper respiratory specimens during the acute phase of infection. The lowest concentration of SARS-CoV-2 viral copies this assay can detect is 138 copies/mL. A negative result does not preclude SARS-Cov-2 infection and should not be used as the sole basis for treatment or other patient management decisions. A negative result may occur with  improper specimen collection/handling, submission of specimen other than nasopharyngeal swab, presence of viral mutation(s) within the areas targeted by this assay, and inadequate number of viral copies(<138 copies/mL). A negative result must be combined with clinical observations, patient history, and epidemiological information. The expected result is Negative.  Fact Sheet for Patients:  EntrepreneurPulse.com.au  Fact Sheet for Healthcare Providers:  IncredibleEmployment.be  This test is no t yet approved or cleared by the [place] FDA and  has been authorized for detection and/or diagnosis of SARS-CoV-2 by FDA under an Emergency Use Authorization (EUA). This EUA will remain  in effect (meaning this test can be used) for the duration of the COVID-19 declaration under Section 564(b)(1) of the Act, 21 U.S.C.section 360bbb-3(b)(1), unless the authorization is terminated  or revoked sooner.       Influenza A by PCR NEGATIVE NEGATIVE Final   Influenza B by PCR NEGATIVE NEGATIVE Final    Comment: (NOTE) The Xpert Xpress SARS-CoV-2/FLU/RSV plus assay is intended as an aid in the diagnosis of influenza from Nasopharyngeal swab specimens and should not be used as a sole basis for treatment. Nasal washings and aspirates are unacceptable for Xpert Xpress SARS-CoV-2/FLU/RSV testing.  Fact Sheet for Patients: EntrepreneurPulse.com.au  Fact Sheet  for Healthcare Providers: IncredibleEmployment.be  This test is not yet approved or cleared by the [place] FDA and has been authorized for detection and/or diagnosis of SARS-CoV-2 by FDA under an Emergency Use Authorization (EUA). This EUA will remain in effect (meaning this test can be used) for the duration of the COVID-19 declaration under Section 564(b)(1) of the Act, 21 U.S.C. section 360bbb-3(b)(1), unless the authorization is terminated or revoked.  Performed at [facility], [place] [address], [place], [place] [address]          Radiology Studies: DG Chest 2 View  Result Date: 01/15/2021 CLINICAL DATA:  Weakness, fall. Suspected sepsis. History of metastatic melanoma EXAM: CHEST - 2 VIEW COMPARISON:  12/22/2020 FINDINGS: Unchanged positioning of left-sided Port-A-Cath. Stable heart size. Aortic atherosclerosis. Low lung volumes with chronically elevated right hemidiaphragm. Unchanged blunting of the left costophrenic angle, may represent scarring versus small  effusion. There are a few small nodular densities within the left lung which are new from prior. No pneumothorax. IMPRESSION: There are a few small nodular densities within the left lung, new from prior. Findings may reflect areas of infection or inflammation although pulmonary nodules are not excluded given history of cancer. Recommend short-term follow-up chest radiograph to ensure resolution. Electronically Signed   By: Davina Poke D.O.   On: 01/15/2021 12:13        Scheduled Meds:  apixaban  5 mg Oral BID   FLUoxetine  40 mg Oral Daily   gabapentin  600 mg Oral QHS   melatonin  10 mg Oral QHS   mirtazapine  7.5 mg Oral QHS   pantoprazole  40 mg Oral QPM   predniSONE  15 mg Oral Q breakfast   pyridOXINE  50 mg Oral Daily   vitamin B-12  1,000 mcg Oral Daily   Continuous Infusions:  aztreonam Stopped (01/16/21 1033)   vancomycin            Aline August,  MD [facility] 01/16/2021, 10:50 AM

## 2021-01-16 NOTE — Consult Note (Signed)
NAME:  [name], MRN:  [id number], DOB:  07-Aug-1945, LOS: 1 ADMISSION DATE:  01/15/2021, CONSULTATION DATE:  01/16/2021 REFERRING MD:  Dr. Hanley Ben, CHIEF COMPLAINT:  Sepsis    History of Present Illness:  [name] is a 75 y.o. male with a PMH significant for CAD, CHF, COPD, maligant melanoma, paroxysmal A-fib on Eliquis, HLD, prior DVT and PE, RA, HTN, and ETOH use who presented to the ED after suffering a fall at [facility]. On EMS arrival patient was seen hypotensive and received IV hydration.   On ED arrival patient met criteria for sepsis with tachycardia, tachypnea, fever, lactic acidosis and leukocytosis with acute signs of cellulitis. Hospitality were consulted and patient was admitted for further management.   Following day while still boarding in the ED patient became more hypoxic and hypercapnic with need of BIPAP therapy. PCCM consulted for further management, note patient is a DNR.   Pertinent medical timeline 08/03/20 saw Dr. Myna Hidalgo who informed the patient of the recurrent melanoma based off of PET scan taken on 07/31/20; which showed a very active lymph node in the patients left inguinal area that measured 2.4 cm 08/15/2020 underwent Excision of left thigh nodule 2 x 1 cm, subcutaneous, excision of deep left inguinal lymph node, excision of abdominal wall nodule 2x2 cm > path revealed metastatic melanoma  Lymphedema resulted post biopsy  9/14-9/21 admitted at this facility for sepsis secondary to cellulitis of left leg, wound culture noted to have grown Citrobacter on 8/172022 and received Bactrim in outpatient setting. Patient maintained on IV vancomycin during the hospitalization and subsequently transition to oral Zyvox on day of discharge to complete 5 more days per ID recommendations.   Pertinent  Medical History  Significant for but not limited to   CAD, CHF, COPD, maligant melanoma, paroxysmal A-fib on Eliquis, HLD, prior DVT and PE,  RA, HTN, and ETOH  Significant Hospital Events:  10/10 admitted with sepsis felt secondary to cellulitis   Interim History / Subjective:  As above   Objective   Blood pressure 97/64, pulse 93, temperature 97.8 F (36.6 C), temperature source Oral, resp. rate 10, height 5\' 6"  (1.676 m), weight 66.9 kg, SpO2 95 %.        Intake/Output Summary (Last 24 hours) at 01/16/2021 1503 Last data filed at 01/16/2021 1234 Gross per 24 hour  Intake 5148.11 ml  Output --  Net 5148.11 ml   Filed Weights   01/15/21 1700  Weight: 66.9 kg    Examination: General: Acute on chronically ill appearing elderly male lying in bed on BIPAP in NAD HEENT: [place]/AT, MM pink/moist, PERRL,  Neuro: Lethargic but will open eyes to verbal stimuli  CV: s1s2 regular rate and rhythm, no murmur, rubs, or gallops,  PULM:  Diminished air entry bilaterally, tolerating BIPAP, no increased work of breathing GI: soft, bowel sounds active in all 4 quadrants, non-tender, non-distended Extremities: warm/dry, no edema  Skin: no rashes or lesions  Resolved Hospital Problem list     Assessment & Plan:  Acute Hypoxic and Hypercapnic Respiratory Failure  -Secondary to sepsis in the setting of cellulitis -ABG: 7.20 ? 74.6 / 180 / 28.6 P: Continue ventilator support with lung protective strategies  Wean PEEP and FiO2 for sats greater than 90%. Head of bed elevated 30 degrees. Plateau pressures less than 30 cm H20.  Follow intermittent chest x-ray and ABG.   SAT/SBT as tolerated, mentation preclude extubation  Ensure adequate pulmonary hygiene  Follow cultures  VAP bundle in place  PAD protocol  Severe sepsis -Patient met criteria for sepsis with tachycardia, tachypnea, fever, lactic acidosis and leukocytosis with acute signs of cellulitis. Hospitality were consulted and patient was admitted for further management.  P: Admit stepdown Supplemental oxygen via BIPAP as above, note patient is a DNR Pan cultures prior  to antibiotic Continue IV vanc per primary  Aggressive IV hydration provided on admit  MAP< 65 will need to discuss with family patients wishes for pressors if needed  Trend lactic acid Wound care  Monitor urine output  Rest of acute and chronic medical issues listed below managed per primary team  -AKI -Macrocytic anemia  HTN Paroxysmal A-fib COPD Hx of PE and DVT CAD Anxiety Deconditioning   Best Practice   Per primary   Labs   CBC: Recent Labs  Lab 01/15/21 1057 01/15/21 1231 01/16/21 0612  WBC 15.5* 16.3* 14.7*  NEUTROABS 13.5* 14.0* 13.1*  HGB 10.2* 10.9* 10.0*  HCT 33.9* 35.5* 33.7*  MCV 98.5 98.1 102.4*  PLT 194 189 169    Basic Metabolic Panel: Recent Labs  Lab 01/15/21 1057 01/16/21 0612  NA 138  --   K 3.6  --   CL 94*  --   CO2 37*  --   GLUCOSE 119*  --   BUN 44*  --   CREATININE 2.02* 1.23  CALCIUM 7.4*  --    GFR: Estimated Creatinine Clearance: 48.3 mL/min (by C-G formula based on SCr of 1.23 mg/dL). Recent Labs  Lab 01/15/21 1057 01/15/21 1231 01/15/21 1318 01/16/21 0612  WBC 15.5* 16.3*  --  14.7*  LATICACIDVEN 2.6*  --  2.3*  --     Liver Function Tests: Recent Labs  Lab 01/15/21 1057  AST 36  ALT 16  ALKPHOS 46  BILITOT 0.9  PROT 5.5*  ALBUMIN 3.0*   No results for input(s): LIPASE, AMYLASE in the last 168 hours. No results for input(s): AMMONIA in the last 168 hours.  ABG    Component Value Date/Time   PHART 7.208 (L) 01/16/2021 1252   PCO2ART 74.6 (HH) 01/16/2021 1252   PO2ART 180 (H) 01/16/2021 1252   HCO3 28.6 (H) 01/16/2021 1252   TCO2 29 05/06/2013 1438   ACIDBASEDEF 0.2 01/16/2021 1252   O2SAT 99.1 01/16/2021 1252     Coagulation Profile: Recent Labs  Lab 01/15/21 1057  INR 2.1*    Cardiac Enzymes: No results for input(s): CKTOTAL, CKMB, CKMBINDEX, TROPONINI in the last 168 hours.  HbA1C: Hgb A1c MFr Bld  Date/Time Value Ref Range Status  09/28/2015 12:23 PM 6.0 4.6 - 6.5 % Final     Comment:    Glycemic Control Guidelines for People with Diabetes:Non Diabetic:  <6%Goal of Therapy: <7%Additional Action Suggested:  >8%   12/31/2013 12:08 PM 6.0 4.6 - 6.5 % Final    Comment:    Glycemic Control Guidelines for People with Diabetes:Non Diabetic:  <6%Goal of Therapy: <7%Additional Action Suggested:  >8%     CBG: No results for input(s): GLUCAP in the last 168 hours.  Review of Systems:   Unable to assess   Past Medical History:  He,  has a past medical history of Alcoholism /alcohol abuse, Allergic rhinitis, Allergy, Anxiety, Bacterial infection, CAD (coronary artery disease), Cataract, CHF (congestive heart failure) (HCC), Chronic back pain, Chronic neck pain, COPD (chronic obstructive pulmonary disease) (HCC), Depression, Diverticulosis, DVT (deep venous thrombosis) (HCC) (06/2020), Dyspnea, Dysrhythmia (01/24/2012), Falls frequently, Family history of cancer, Genetic testing (03/06/2017), GERD (  gastroesophageal reflux disease), Goals of care, counseling/discussion (10/12/2020), Hemochromatosis, High cholesterol, History of radiation therapy (11/06/2020), colonic polyps, colonoscopy, Hypertension, Iron deficiency anemia due to chronic blood loss (12/27/2016), Malignant melanoma of knee, left (HCC), Myocardial infarction (HCC) (2006), Narcotic abuse (HCC), Neuropathy, Osteoarthritis, Peripheral edema, PONV (postoperative nausea and vomiting), Pulmonary embolism (HCC) (06/2020), and RA (rheumatoid arthritis) (HCC).   Surgical History:   Past Surgical History:  Procedure Laterality Date   ABDOMINAL ADHESION SURGERY  ~ 1968   ANKLE RECONSTRUCTION Right 6-09   [facility]   APPENDECTOMY  ~ 1956   ASPIRATION OF ABSCESS Left 03/27/2017   Procedure: ASPIRATION OF SEROMA;  Surgeon: Almond Lint, MD;  Location: [facility];  Service: General;  Laterality: Left;   BONE TUMOR RESECTION  ~ 1954   "taken off my mastoid"   CARPAL TUNNEL RELEASE Right 1990's   CATARACT  EXTRACTION, BILATERAL Bilateral 01-24-12   CORONARY ANGIOPLASTY WITH STENT PLACEMENT  2006   "while repairing 1st stent, a second area tore and they had to place 2nd stent " ?LAD & CX   CORONARY ANGIOPLASTY WITH STENT PLACEMENT  06/17/2014   CYST EXCISION  "several OR's"   "backX 2, back of my neck, face, inside right bicept, chest, wrist"   FOOT SURGERY Right 11-08   for removal of bone spurs-   HAMMER TOE SURGERY Right 07/2012   "broke 4 hammertoes"    HARDWARE REMOVAL  03/09/2012   Procedure: HARDWARE REMOVAL;  Surgeon: Mable Paris, MD;  Location: [facility];  Service: Orthopedics;  Laterality: Left;  Hardware Removal from Left Shoulder   HARVEST BONE GRAFT  02/06/2012   Procedure: HARVEST ILIAC BONE GRAFT;  Surgeon: Mable Paris, MD;  Location: [facility];  Service: Orthopedics;;  bone marrow aspirqation    INGUINAL HERNIA REPAIR Bilateral    JOINT REPLACEMENT     KNEE SURGERY Left ~ 2003   "6-12 months after uni knee removed synovial sack"   LEFT HEART CATHETERIZATION WITH CORONARY ANGIOGRAM N/A 06/17/2014   PCI of diffuse severe stenosis in the proximal to mid LAD using overlapping drug-eluting stents.   LUMBAR WOUND DEBRIDEMENT N/A 03/17/2013   Procedure: Incision and drainage of superficial lumbar wound;  Surgeon: Karn Cassis, MD;  Location: [facility];  Service: Neurosurgery;  Laterality: N/A;  Incision and drainage of superficial lumbar wound   LYMPH NODE BIOPSY N/A 08/15/2020   Procedure: EXCISIONAL LYMPH NODE BIOPSY LEFT INGUINAL REGION, EXCISION OF LEFT THIGH NODULE, EXCISION OF LEFT ABDOMINAL WALL LESION;  Surgeon: Almond Lint, MD;  Location: [facility];  Service: General;  Laterality: N/A;   MECKEL DIVERTICULUM EXCISION  ~ 1956   MELANOMA EXCISION WITH SENTINEL LYMPH NODE BIOPSY Left 02/12/2017   WIDE LOCAL EXCISION LEFT KNEE MELANOMA, ADVANCEMENT FLAP CLOSURE, AND SENTINEL LYMPH NODE MAPPING AND BIOPSY.   MELANOMA EXCISION WITH SENTINEL  LYMPH NODE BIOPSY Left 02/12/2017   Procedure: WIDE LOCAL EXCISION LEFT KNEE MELANOMA, ADVANCEMENT FLAP CLOSURE, AND SENTINEL LYMPH NODE MAPPING AND BIOPSY.;  Surgeon: Almond Lint, MD;  Location: [facility];  Service: General;  Laterality: Left;  GENERAL AND LOCAL   ORIF SHOULDER FRACTURE  02/06/2012   Procedure: OPEN REDUCTION INTERNAL FIXATION (ORIF) SHOULDER FRACTURE;  Surgeon: Mable Paris, MD;  Location: [facility];  Service: Orthopedics;  Laterality: Left;  ORIF of a Left Shoulder Fracture with  Iliac Crest Bone Graft aspiration    PORTACATH PLACEMENT Left 03/27/2017   Procedure: INSERTION PORT-A-CATH;  Surgeon: Almond Lint,  MD;  Location: [facility];  Service: General;  Laterality: Left;   POSTERIOR LUMBAR FUSION  12-10   L4-5 diskectomy w/ fusion, cage placement and rods; [facility]   POSTERIOR LUMBAR FUSION 4 LEVEL N/A 03/02/2013   Procedure: Lumbar One to Sacral One Posterior lumbar interbody fusion;  Surgeon: Karn Cassis, MD;  Location: [facility];  Service: Neurosurgery;  Laterality: N/A;  L1 to S1 Posterior lumbar interbody fusion   REPLACEMENT UNICONDYLAR JOINT KNEE Left ~ 2003   "~ 6 months after total knee replaced"   SHOULDER ARTHROSCOPY Left ~ 2004 X 2   "@ [facility]; left bone splinter in & had to clean it out"   TOTAL ANKLE REPLACEMENT Right 2008   at [facility]   TOTAL KNEE ARTHROPLASTY Bilateral 2002   TOTAL SHOULDER REPLACEMENT Left 2006   TOTAL SHOULDER REPLACEMENT Right ~ 2007   Dr. August Saucer     Social History:   reports that he has never smoked. He has never used smokeless tobacco. He reports that he does not drink alcohol and does not use drugs.   Family History:  His family history includes Cancer in his maternal grandmother; Cancer - Lung in his mother; Cancer - Prostate (age of onset: 49) in his father; Colon polyps in his father; Coronary artery disease in his father and maternal aunt; Heart attack in his brother and maternal aunt; Hemochromatosis in  his sister; Hyperlipidemia in his brother; Hypertension in his father; Lung cancer in his mother; Macular degeneration in his mother; Melanoma in his mother; Other in his brother and mother; Prostate cancer in his father; Rheum arthritis in his sister; Thyroid disease in his sister; Uterine cancer in his mother. There is no history of Diabetes, Colon cancer, Esophageal cancer, Rectal cancer, or Stomach cancer.   Allergies Allergies  Allergen Reactions   Cefepime Hives and Shortness Of Breath   Cephaeline Hives   Morphine Swelling    A swollen stomach.   Morphine And Related Shortness Of Breath, Nausea And Vomiting, Swelling and Other (See Comments)    Agitation, tolerates dilaudid Other reaction(s): Other (See Comments) Agitation, tolerates dilaudid   Penicillins Hives, Shortness Of Breath and Rash    Has patient had a PCN reaction causing immediate rash, facial/tongue/throat swelling, SOB or lightheadedness with hypotension: Yes Has patient had a PCN reaction causing severe rash involving mucus membranes or skin necrosis: Yes Has patient had a PCN reaction that required hospitalization No Has patient had a PCN reaction occurring within the last 10 years: No If all of the above answers are "NO", then may proceed with Cephalosporin use.    Doxycycline Rash   Oxycodone-Acetaminophen Other (See Comments)    Patient doesn't remember what type of reaction.     Home Medications  Prior to Admission medications   Medication Sig Start Date End Date Taking? Authorizing Provider  albuterol (PROVENTIL HFA;VENTOLIN HFA) 108 (90 Base) MCG/ACT inhaler Inhale 1-2 puffs into the lungs every 4 (four) hours as needed for wheezing or shortness of breath. 09/11/16  Yes Plotnikov, Georgina Quint, MD  ALPRAZolam Prudy Feeler) 0.5 MG tablet Take 1 tablet (0.5 mg total) by mouth 3 (three) times daily as needed for anxiety. for anxiety Patient taking differently: Take 0.5 mg by mouth 3 (three) times daily as needed for  anxiety. 01/10/21  Yes Plotnikov, Georgina Quint, MD  apixaban (ELIQUIS) 5 MG TABS tablet Take 1 tablet (5 mg total) by mouth 2 (two) times daily. 06/06/20 01/15/21 Yes Plotnikov, Georgina Quint,  MD  Cholecalciferol (VITAMIN D3) 2000 UNITS capsule Take 1 capsule (2,000 Units total) by mouth daily. 12/05/14  Yes Plotnikov, Georgina Quint, MD  FLUoxetine (PROZAC) 40 MG capsule Take 1 capsule (40 mg total) by mouth daily. 01/01/21  Yes Rodolph Bong, MD  fluticasone (FLONASE) 50 MCG/ACT nasal spray INSTILL 2 SPRAYS IN EACH NOSTRIL EVERY DAY Patient taking differently: Place 2 sprays into both nostrils daily as needed for allergies. 11/19/17  Yes Plotnikov, Georgina Quint, MD  furosemide (LASIX) 20 MG tablet Take 20 mg by mouth 2 (two) times daily.   Yes [provider]  gabapentin (NEURONTIN) 300 MG capsule Take 2 capsules (600 mg total) by mouth at bedtime. 11/02/20  Yes Plotnikov, Georgina Quint, MD  HYDROmorphone (DILAUDID) 2 MG tablet Take 1-1.5 tablets (2-3 mg total) by mouth every 8 (eight) hours as needed for severe pain. 01/10/21  Yes Plotnikov, Georgina Quint, MD  lidocaine-prilocaine (EMLA) cream Apply 1 application topically as needed. Patient taking differently: Apply 1 application topically daily as needed (pain). 05/25/20  Yes Erenest Blank, NP  Melatonin 10 MG TABS Take 10 mg by mouth at bedtime.   Yes [provider]  metoprolol tartrate (LOPRESSOR) 25 MG tablet TAKE 1 TABLET TWO TIMES DAILY. Patient taking differently: Take 25 mg by mouth 2 (two) times daily. 11/06/20  Yes Plotnikov, Georgina Quint, MD  mirtazapine (REMERON) 7.5 MG tablet Take 7.5 mg by mouth at bedtime. 01/05/21  Yes [provider]  mupirocin ointment (BACTROBAN) 2 % On leg wound w/dressing change qd or bid Patient taking differently: Apply 1 application topically daily. On leg wound w/dressing change qd or bid 11/02/20  Yes Plotnikov, Georgina Quint, MD  pantoprazole (PROTONIX) 40 MG tablet Take 1 tablet (40 mg total) by mouth every  evening. Patient taking differently: Take 40 mg by mouth 2 (two) times daily. 06/19/20  Yes Plotnikov, Georgina Quint, MD  polyvinyl alcohol (LIQUIFILM TEARS) 1.4 % ophthalmic solution Place 1 drop into both eyes daily as needed for dry eyes.   Yes [provider]  predniSONE (DELTASONE) 5 MG tablet Take 10 mg by mouth daily. 06/19/20  Yes [provider]  Pyridoxine HCl (B-6 PO) Take 1 tablet by mouth daily.   Yes [provider]  temazepam (RESTORIL) 15 MG capsule Take 1 capsule (15 mg total) by mouth at bedtime as needed for sleep. 10/12/20  Yes Josph Macho, MD  traZODone (DESYREL) 100 MG tablet Take 100 mg by mouth at bedtime.   Yes [provider]  vitamin B-12 (CYANOCOBALAMIN) 1000 MCG tablet Take 1,000 mcg by mouth daily.    Yes [provider]  FLUoxetine (PROZAC) 20 MG capsule Take 1 capsule (20 mg total) by mouth daily for 5 days. Patient not taking: Reported on 01/15/2021 12/26/20 01/15/21  Rodolph Bong, MD  torsemide [facility]) 100 MG tablet Take 0.5-1 tablets (50-100 mg total) by mouth daily as needed. Patient not taking: Reported on 01/15/2021 07/05/20   Plotnikov, Georgina Quint, MD     Critical care time: NA  Jeremiah Tarpley D. Tiburcio Pea, NP-C [facility] Personal contact information can be found on Amion  01/16/2021, 4:24 PM

## 2021-01-16 NOTE — Consult Note (Addendum)
WOC Nurse Consult Note: Reason for Consult: RLE skin tear, left medial thigh full thickness wound, left inguinal area full thickness wound Wound type: infectious, chronic, nonhealing; trauma Pressure Injury POA: N/A Measurement: RLE skin tear:  0.5cm x 4cm x 0.2cm with bright red wound bed, serous to serosanguinous exudate in a small amount. Fragile periwound tissue. Left medial thigh full thickness wound:  1cm round x 0.1cm depth with punctate appearance and pale pink wound bed. Serous exudate in a small amount. Minute undermining circumferentially. Mild periwound erythema. Left inguinal area full thickness wound:  0.3cm round x 1.5cm depth with 0.5cm undermining at 3 and 9 o'clock. Red wound bed, small amount of serous exudate. Periwound erythema. No induration. Wound bed:As described above. Drainage (amount, consistency, odor) As described above. Periwound:As described above. Dressing procedure/placement/frequency: Patient with chronic nonhealing wounds of infectious origin to left LE and a traumatic wound to the right LE. I have provided Nursing with care orders for these lesions via the orders using a silver hydrofiber (Aquacel Ag+ Advantage) and changed daily for the LLE and xeroform gauze to the skin tear. Patient is at risk for continued skin breakdown due to fecal incontinence evident on assessment and he is wearing a brief, which will be discontinued while in house. Provision of care per out house protocols will be implemented. Turing and repositioning is in place and will continue. Flotation of heels will be provided.  [place] nursing team will not follow, but will remain available to this patient, the nursing and medical teams.  Please re-consult if needed. Thanks, Maudie Flakes, MSN, RN, [place], Arther Abbott  Pager# [phone]

## 2021-01-16 NOTE — Telephone Encounter (Signed)
[facility] FYI 01/12/2021...   Caller's home health nurse contacted office due to patient having increased lower ext swelling. Patient states he had not taken his diuretic today. Patient feels nurse was being overly cautious. Caller has had edema for 3 months intermittently.   Advised to see PCP within 24 hours. Caller disagreed.

## 2021-01-16 NOTE — ED Notes (Signed)
Respiratory at the bedside to assess patient.

## 2021-01-16 NOTE — ED Notes (Signed)
Report given. Call to RT help with transport

## 2021-01-16 NOTE — ED Notes (Signed)
MD is aware of sats and BP

## 2021-01-16 NOTE — Progress Notes (Signed)
Pharmacy Antibiotic Note  [name] is a 75 y.o. male admitted on 01/15/2021 with LE wound infection.  Pharmacy has been consulted for Vancomycin & Aztreonam dosing. Noted severe allergy to Cefepime, Penicillins with hives, ShOB; last Cefepime charted 2015.  Admit SCr elevated at 2.02, prev SCr 0.74 on 9/21, anticipate will improve with hydration, treatment  Today, 01/16/2021 SCr reduced to 1.23  Plan: Vancomycin 1256m x1 in ED, then 1gm q24 Aztreonam 2g q8  Height: 5' 6"  (167.6 cm) Weight: 66.9 kg (147 lb 6.4 oz) IBW/kg (Calculated) : 63.8  Temp (24hrs), Avg:100.9 F (38.3 C), Min:98.5 F (36.9 C), Max:103.2 F (39.6 C)  Recent Labs  Lab 01/15/21 1057 01/15/21 1231 01/15/21 1318 01/16/21 0612  WBC 15.5* 16.3*  --  14.7*  CREATININE 2.02*  --   --  1.23  LATICACIDVEN 2.6*  --  2.3*  --      Estimated Creatinine Clearance: 48.3 mL/min (by C-G formula based on SCr of 1.23 mg/dL).    Allergies  Allergen Reactions   Cefepime Hives and Shortness Of Breath   Cephaeline Hives   Morphine Swelling    A swollen stomach.   Morphine And Related Shortness Of Breath, Nausea And Vomiting, Swelling and Other (See Comments)    Agitation, tolerates dilaudid Other reaction(s): Other (See Comments) Agitation, tolerates dilaudid   Penicillins Hives, Shortness Of Breath and Rash    Has patient had a PCN reaction causing immediate rash, facial/tongue/throat swelling, SOB or lightheadedness with hypotension: Yes Has patient had a PCN reaction causing severe rash involving mucus membranes or skin necrosis: Yes Has patient had a PCN reaction that required hospitalization No Has patient had a PCN reaction occurring within the last 10 years: No If all of the above answers are "NO", then may proceed with Cephalosporin use.    Doxycycline Rash   Oxycodone-Acetaminophen Other (See Comments)    Patient doesn't remember what type of reaction.    Antimicrobials this admission: 10/10  Flagyl x1  10/10 Vancomycin >>  10/10 Aztreonam >>  Dose adjustments this admission:  Microbiology results: 10/10 BCx: sent 10/10 UCx: sent  Thank you for allowing pharmacy to be a part of this patient's care.  GRemer MachoD 01/16/2021 7:13 AM

## 2021-01-16 NOTE — Progress Notes (Signed)
[facility] Progress Note Patient Name: [name] DOB: [birth year] MRN: [id number]   Date of Service  01/16/2021  HPI/Events of Note  1) abg unsuccessful, therefore VBG obtained 2) VBG results are a bit better, relatively, than the prior ABG 3) pt has good chest rise on BiPAP and work of breathing appears stable 4) pt retaining urine  eICU Interventions  - due to soft blood pressures, will ask that a foley be inserted - also, will ask that pt remain on BiPAP at this time     Intervention Category Intermediate Interventions: Respiratory distress - evaluation and management  Tilden Dome 01/16/2021, 10:03 PM

## 2021-01-16 NOTE — ED Notes (Signed)
Respiratory at bedside to place on bipap.

## 2021-01-16 NOTE — Progress Notes (Signed)
eLink Physician-Brief Progress Note Patient Name: [name] DOB: 03/03/1946 MRN: [id number]   Date of Service  01/16/2021  HPI/Events of Note  Pt's SBP has been soft, also pt a bit less responsive  eICU Interventions  - levophed started; for MAP < 65 - new abg ordered     Intervention Category Intermediate Interventions: Hypotension - evaluation and management  Tilden Dome 01/16/2021, 7:44 PM

## 2021-01-16 NOTE — Progress Notes (Signed)
eLink Physician-Brief Progress Note Patient Name: [name] DOB: Jun 21, 1945 MRN: [id number]   Date of Service  01/16/2021  HPI/Events of Note  Pt too somnolent to use inhaler or take PO meds (and also is still on BiPAP)  eICU Interventions  Dulera held; brovana and pulmicort nebs started  Also, ok to hold apixaban, ssri, and neurontin tonight     Intervention Category Intermediate Interventions: Change in mental status - evaluation and management  Tilden Dome 01/16/2021, 10:45 PM

## 2021-01-16 NOTE — ED Notes (Signed)
Pt given meal tray.

## 2021-01-17 ENCOUNTER — Other Ambulatory Visit: Payer: Self-pay

## 2021-01-17 ENCOUNTER — Ambulatory Visit: Payer: Medicare Other | Admitting: Internal Medicine

## 2021-01-17 DIAGNOSIS — C4372 Malignant melanoma of left lower limb, including hip: Secondary | ICD-10-CM

## 2021-01-17 DIAGNOSIS — L03116 Cellulitis of left lower limb: Secondary | ICD-10-CM

## 2021-01-17 DIAGNOSIS — Z515 Encounter for palliative care: Secondary | ICD-10-CM | POA: Diagnosis not present

## 2021-01-17 DIAGNOSIS — Z7189 Other specified counseling: Secondary | ICD-10-CM | POA: Diagnosis not present

## 2021-01-17 DIAGNOSIS — A419 Sepsis, unspecified organism: Principal | ICD-10-CM

## 2021-01-17 DIAGNOSIS — L0390 Cellulitis, unspecified: Secondary | ICD-10-CM | POA: Diagnosis not present

## 2021-01-17 DIAGNOSIS — R531 Weakness: Secondary | ICD-10-CM

## 2021-01-17 LAB — COMPREHENSIVE METABOLIC PANEL
ALT: 16 U/L (ref 0–44)
AST: 21 U/L (ref 15–41)
Albumin: 2.4 g/dL — ABNORMAL LOW (ref 3.5–5.0)
Alkaline Phosphatase: 40 U/L (ref 38–126)
Anion gap: 8 (ref 5–15)
BUN: 34 mg/dL — ABNORMAL HIGH (ref 8–23)
CO2: 29 mmol/L (ref 22–32)
Calcium: 7.6 mg/dL — ABNORMAL LOW (ref 8.9–10.3)
Chloride: 105 mmol/L (ref 98–111)
Creatinine, Ser: 0.86 mg/dL (ref 0.61–1.24)
GFR, Estimated: >60 mL/min (ref >60)
Glucose, Bld: 174 mg/dL — ABNORMAL HIGH (ref 70–99)
Potassium: 3.4 mmol/L — ABNORMAL LOW (ref 3.5–5.1)
Sodium: 142 mmol/L (ref 135–145)
Total Bilirubin: 1.2 mg/dL (ref 0.3–1.2)
Total Protein: 4.7 g/dL — ABNORMAL LOW (ref 6.5–8.1)

## 2021-01-17 LAB — CBC WITH DIFFERENTIAL/PLATELET
Abs Immature Granulocytes: 0.03 10*3/uL (ref 0.00–0.07)
Basophils Absolute: 0 10*3/uL (ref 0.0–0.1)
Basophils Relative: 0 %
Eosinophils Absolute: 0 10*3/uL (ref 0.0–0.5)
Eosinophils Relative: 0 %
HCT: 29.5 % — ABNORMAL LOW (ref 39.0–52.0)
Hemoglobin: 8.9 g/dL — ABNORMAL LOW (ref 13.0–17.0)
Immature Granulocytes: 1 %
Lymphocytes Relative: 3 %
Lymphs Abs: 0.2 10*3/uL — ABNORMAL LOW (ref 0.7–4.0)
MCH: 30 pg (ref 26.0–34.0)
MCHC: 30.2 g/dL (ref 30.0–36.0)
MCV: 99.3 fL (ref 80.0–100.0)
Monocytes Absolute: 0.2 10*3/uL (ref 0.1–1.0)
Monocytes Relative: 3 %
Neutro Abs: 6 10*3/uL (ref 1.7–7.7)
Neutrophils Relative %: 93 %
Platelets: 155 10*3/uL (ref 150–400)
RBC: 2.97 MIL/uL — ABNORMAL LOW (ref 4.22–5.81)
RDW: 14.2 % (ref 11.5–15.5)
WBC: 6.5 10*3/uL (ref 4.0–10.5)
nRBC: 0 % (ref 0.0–0.2)

## 2021-01-17 LAB — MAGNESIUM: Magnesium: 2.3 mg/dL (ref 1.7–2.4)

## 2021-01-17 MED ORDER — PROSOURCE PLUS PO LIQD
30.0000 mL | Freq: Two times a day (BID) | ORAL | Status: DC
  Administered 2021-01-17 – 2021-01-20 (×7): 30 mL via ORAL
  Filled 2021-01-17 (×8): qty 30

## 2021-01-17 MED ORDER — VANCOMYCIN HCL 1500 MG/300ML IV SOLN
1500.0000 mg | INTRAVENOUS | Status: AC
Start: 2021-01-17 — End: 2021-01-19
  Administered 2021-01-17 – 2021-01-19 (×3): 1500 mg via INTRAVENOUS
  Filled 2021-01-17 (×4): qty 300

## 2021-01-17 MED ORDER — ENSURE ENLIVE PO LIQD
237.0000 mL | Freq: Two times a day (BID) | ORAL | Status: DC
  Administered 2021-01-18 – 2021-01-24 (×11): 237 mL via ORAL

## 2021-01-17 MED ORDER — POTASSIUM CHLORIDE 10 MEQ/100ML IV SOLN
10.0000 meq | INTRAVENOUS | Status: AC
  Administered 2021-01-17 (×4): 10 meq via INTRAVENOUS
  Filled 2021-01-17 (×3): qty 100

## 2021-01-17 MED ORDER — ADULT MULTIVITAMIN W/MINERALS CH
1.0000 | ORAL_TABLET | Freq: Every day | ORAL | Status: DC
  Administered 2021-01-17 – 2021-01-24 (×8): 1 via ORAL
  Filled 2021-01-17 (×8): qty 1

## 2021-01-17 MED ORDER — MELATONIN 5 MG PO TABS
10.0000 mg | ORAL_TABLET | Freq: Once | ORAL | Status: AC | PRN
  Administered 2021-01-17: 10 mg via ORAL
  Filled 2021-01-17: qty 2

## 2021-01-17 MED ORDER — HYDROMORPHONE HCL 2 MG PO TABS
2.0000 mg | ORAL_TABLET | Freq: Four times a day (QID) | ORAL | Status: DC | PRN
  Administered 2021-01-17 – 2021-01-24 (×17): 2 mg via ORAL
  Filled 2021-01-17 (×17): qty 1

## 2021-01-17 MED ORDER — MELATONIN 5 MG PO TABS: 5.0000 mg | ORAL_TABLET | Freq: Once | ORAL | Status: DC | PRN

## 2021-01-17 MED ORDER — MOMETASONE FURO-FORMOTEROL FUM 200-5 MCG/ACT IN AERO
2.0000 | INHALATION_SPRAY | Freq: Two times a day (BID) | RESPIRATORY_TRACT | Status: DC
  Administered 2021-01-18 – 2021-01-24 (×13): 2 via RESPIRATORY_TRACT
  Filled 2021-01-17: qty 8.8

## 2021-01-17 MED ORDER — JUVEN PO PACK
1.0000 | PACK | Freq: Two times a day (BID) | ORAL | Status: DC
  Administered 2021-01-18 – 2021-01-23 (×9): 1 via ORAL
  Filled 2021-01-17 (×15): qty 1

## 2021-01-17 NOTE — Progress Notes (Signed)
[facility] Progress Note Patient Name: [name] DOB: Sep 09, 1945 MRN: [id number]   Date of Service  01/17/2021  HPI/Events of Note  Patient takes melatonin 57m at home for sleep and has done so for several years. RN asking you to order his home med  eICU Interventions  Ordered melatonin 5 mg prn for insomnia x 1     Intervention Category Minor Interventions: Routine modifications to care plan (e.g. PRN medications for pain, fever)  EShona NeedlesAventura 01/17/2021, 10:12 PM

## 2021-01-17 NOTE — Telephone Encounter (Signed)
The patient is in the hospital.  Thanks

## 2021-01-17 NOTE — Progress Notes (Signed)
Initial Nutrition Assessment  DOCUMENTATION CODES:   Not applicable  INTERVENTION:  - will order Ensure Plus BID, each supplement provides 350 kcal and 13 grams of protein. - will order 1 packet Juven BID, each packet provides 95 calories, 2.5 grams of protein (collagen), and 9.8 grams of carbohydrate (3 grams sugar); also contains 7 grams of L-arginine and L-glutamine, 300 mg vitamin C, 15 mg vitamin E, 1.2 mcg vitamin B-12, 9.5 mg zinc, 200 mg calcium, and 1.5 g  Calcium Beta-hydroxy-Beta-methylbutyrate to support wound healing. - will order 30 ml Prosource Plus BID, each supplement provides 100 kcal and 15 grams protein.  - will order 1 tablet multivitamin with minerals/day. - complete NFPE at follow-up.   NUTRITION DIAGNOSIS:   Increased nutrient needs related to wound healing, cancer and cancer related treatments as evidenced by estimated needs.  GOAL:   Patient will meet greater than or equal to 90% of their needs  MONITOR:   PO intake, Supplement acceptance, Labs, Weight trends  REASON FOR ASSESSMENT:   Malnutrition Screening Tool  ASSESSMENT:   75 y.o. male with medical history of alcohol abuse, allergic rhinitis, anxiety, CAD, CHF, chronic back pain, COPD, depression, diverticulosis, history of DVT and PE, hemochromatosis, GERD, frequent falls, HLD, chronic blood loss anemia, narcotic abuse history, neuropathy, osteoarthritis, and rheumatoid arthritis. He presented to the ED via EMS from [facility] due to increased weakness and multiple wounds to LLE 2/2 metastatic melanoma.  Unable to see patient despite multiple attempts throughout the day; other disciplines in caring for or talking with patient.   No meal intakes documented since admission.  He was seen by another RD on 12/21/20.  Weight today is 150 lb and has been stable since 08/15/20. Deep pitting edema to BLE documented in the edema section of flow sheet.   Palliative Care saw patient earlier this  afternoon. He is DNR.   Per notes: - severe sepsis requiring pressors  - AKI   Labs reviewed; K: 3.4 mmol/l, BUN: 34 mg/dl, Ca: 7.6 mg/dl. Medications reviewed; 1 mg folvite/day, 40 mg oral protonix/day, 10 mEq IV KCl x4 runs 10/12, 50 mg vitamin B6/day, 1000 mcg oral cyanocobalamin.  IVF; NS @ 75 ml/hr.     NUTRITION - FOCUSED PHYSICAL EXAM:  Unable to complete at this time.   Diet Order:   Diet Order             Diet Heart Room service appropriate? Yes; Fluid consistency: Thin  Diet effective now                   EDUCATION NEEDS:   Not appropriate for education at this time  Skin:  Skin Assessment: Skin Integrity Issues: Skin Integrity Issues:: Stage III Stage III: L buttocks  Last BM:  PTA/unknown  Height:   Ht Readings from Last 1 Encounters:  01/15/21 5' 6"  (1.676 m)    Weight:   Wt Readings from Last 1 Encounters:  01/17/21 67.9 kg     Estimated Nutritional Needs:  Kcal:  2300-2500 kcal Protein:  115-130 grams Fluid:  >/= 2 L/day      Jarome Matin, MS, RD, LDN, CNSC Inpatient Clinical Dietitian RD pager # available in [facility]  After hours/weekend pager # available in [facility]

## 2021-01-17 NOTE — Progress Notes (Signed)
Patient ID: [name], male   DOB: 1945-08-05, 75 y.o.   MRN: [id number] Patient has been started on Levophed drip overnight and he is currently still on BiPAP.  Communicated with Dr. Olalere/PCCM via secure chat who is graciously excepted the patient to [facility] service for today.  TRH service will be happy to take him back once he is off pressors and hemodynamically more stable.

## 2021-01-17 NOTE — Consult Note (Signed)
Referral MD  Reason for Referral: Cellulitis; sepsis; recurrent melanoma  No chief complaint on file. : Patient cannot give history.  HPI: Mr. Nicoson is well-known to me.  He is a 75 year old white male.  He has recurrent melanoma.  Recurrence has been local.  He underwent lymphadenectomy in the inguinal area.  He developed some lymphedema.  He has been hospitalized on several occasions.  He has had problems with wound healing.  He had radiation therapy after the lymphadenectomy.  I am sure that this is probably affecting wound healing.  He now is admitted to the ICU with sepsis.  Looks like he has cellulitis in the legs.  He does not have as much swelling in the legs.  Last time I saw him in the office, he had a lot of lymphedema in the legs.  He has never had any systemic therapy for the melanoma.  Today, his labs show white cell count 6.5.  Hemoglobin 8.9.  Platelet count 155,000.  Sodium 142.  Potassium 3.4.  BUN 34 creatinine 0.86.  Calcium is 7.6 with an albumin of 2.4.  He had a chest x-ray done which showed worsening bilateral airspace disease which could reflect edema or infection.  Is on BiPAP right now.  He is on antibiotics.  I would have to say that his performance status is probably ECOG 2-3.  Past Medical History:  Diagnosis Date   Alcoholism /alcohol abuse    per family   Allergic rhinitis    Allergy    Anxiety    Bacterial infection    CAD (coronary artery disease)    minimal coronary plaque in the LAD and right coronary system. 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 7-06. Last nuclear stress 11-17-06 fixed anterior/ inferior defect, no inducible ischemia, EF 81%   Cataract    CHF (congestive heart failure) (HCC)    Chronic back pain    "all over back"   Chronic neck pain    COPD (chronic obstructive pulmonary disease) ([place])    Depression    Diverticulosis    DVT (deep venous thrombosis) ([place])  06/2020   Dyspnea    with exertion   Dysrhythmia 01/24/2012   past hx. A.Fib x1 episode-responded to med.   Falls frequently    "since 02/2013" (06/16/2013)   Family history of cancer    Genetic testing 03/06/2017   Multi-Cancer panel (83 genes) @ [facility] - No pathogenic mutations detected   GERD (gastroesophageal reflux disease)    Goals of care, counseling/discussion 10/12/2020   Hemochromatosis    dx'd 14 yrs ago last ferritin Aug 11, 08 52 (22-322), Fe 136 ("I had 250 phlebotomies for that")   High cholesterol    hx   History of radiation therapy 11/06/2020   left inguinal thigh 10/10/2020-11/06/2020  Dr Gery Pray   Hx of colonic polyps    Hx of colonoscopy    Hypertension    Iron deficiency anemia due to chronic blood loss 12/27/2016   Malignant melanoma of knee, left (HCC)    Myocardial infarction [facility]) 2006   "related to catheterization"   Narcotic abuse ([place])    per family   Neuropathy    fingers and toes   Osteoarthritis    Peripheral edema    Bilateral ankles   PONV (postoperative nausea and vomiting)    Pulmonary embolism ([place]) 06/2020   RA (rheumatoid arthritis) ([place])   :   Past Surgical History:  Procedure Laterality Date   ABDOMINAL ADHESION SURGERY  ~ 1968   ANKLE RECONSTRUCTION Right 6-09   [facility]   APPENDECTOMY  ~ 1956   ASPIRATION OF ABSCESS Left 03/27/2017   Procedure: ASPIRATION OF SEROMA;  Surgeon: Stark Klein, MD;  Location: [facility];  Service: General;  Laterality: Left;   BONE TUMOR RESECTION  ~ 1954   "taken off my mastoid"   [place] Right 1990's   CATARACT EXTRACTION, BILATERAL Bilateral 01-24-12   CORONARY ANGIOPLASTY WITH STENT PLACEMENT  2006   "while repairing 1st stent, a second area tore and they had to place 2nd stent " ?LAD & CX   CORONARY ANGIOPLASTY WITH STENT PLACEMENT  06/17/2014   CYST EXCISION  "several OR's"   "backX 2, back of my neck, face, inside right bicept, chest, wrist"   FOOT SURGERY  Right 11-08   for removal of bone spurs-   HAMMER TOE SURGERY Right 07/2012   "broke 4 hammertoes"    HARDWARE REMOVAL  03/09/2012   Procedure: HARDWARE REMOVAL;  Surgeon: Nita Sells, MD;  Location: [facility];  Service: Orthopedics;  Laterality: Left;  Hardware Removal from Left Shoulder   HARVEST BONE GRAFT  02/06/2012   Procedure: HARVEST ILIAC BONE GRAFT;  Surgeon: Nita Sells, MD;  Location: [facility];  Service: Orthopedics;;  bone marrow aspirqation    INGUINAL HERNIA REPAIR Bilateral    JOINT REPLACEMENT     KNEE SURGERY Left ~ 2003   "6-12 months after uni knee removed synovial sack"   LEFT HEART CATHETERIZATION WITH CORONARY ANGIOGRAM N/A 06/17/2014   PCI of diffuse severe stenosis in the proximal to mid LAD using overlapping drug-eluting stents.   LUMBAR WOUND DEBRIDEMENT N/A 03/17/2013   Procedure: Incision and drainage of superficial lumbar wound;  Surgeon: Floyce Stakes, MD;  Location: [facility];  Service: Neurosurgery;  Laterality: N/A;  Incision and drainage of superficial lumbar wound   LYMPH NODE BIOPSY N/A 08/15/2020   Procedure: EXCISIONAL LYMPH NODE BIOPSY LEFT INGUINAL REGION, EXCISION OF LEFT THIGH NODULE, EXCISION OF LEFT ABDOMINAL WALL LESION;  Surgeon: Stark Klein, MD;  Location: [facility];  Service: General;  Laterality: N/A;   MECKEL DIVERTICULUM EXCISION  ~ [place] LYMPH NODE BIOPSY Left 02/12/2017   WIDE LOCAL EXCISION LEFT KNEE MELANOMA, ADVANCEMENT FLAP CLOSURE, AND SENTINEL LYMPH NODE MAPPING AND BIOPSY.   MELANOMA EXCISION WITH SENTINEL LYMPH NODE BIOPSY Left 02/12/2017   Procedure: WIDE LOCAL EXCISION LEFT KNEE MELANOMA, ADVANCEMENT FLAP CLOSURE, AND SENTINEL LYMPH NODE MAPPING AND BIOPSY.;  Surgeon: Stark Klein, MD;  Location: [facility];  Service: General;  Laterality: Left;  GENERAL AND LOCAL   ORIF SHOULDER FRACTURE  02/06/2012   Procedure: OPEN REDUCTION INTERNAL FIXATION (ORIF) SHOULDER  FRACTURE;  Surgeon: Nita Sells, MD;  Location: [facility];  Service: Orthopedics;  Laterality: Left;  ORIF of a Left Shoulder Fracture with  Iliac Crest Bone Graft aspiration    PORTACATH PLACEMENT Left 03/27/2017   Procedure: INSERTION PORT-A-CATH;  Surgeon: Stark Klein, MD;  Location: [facility];  Service: General;  Laterality: Left;   POSTERIOR LUMBAR FUSION  12-10   L4-5 diskectomy w/ fusion, cage placement and rods; [place]   POSTERIOR LUMBAR FUSION 4 LEVEL N/A 03/02/2013   Procedure: Lumbar One to Sacral One Posterior lumbar interbody fusion;  Surgeon: Floyce Stakes, MD;  Location: [place] [facility];  Service: Neurosurgery;  Laterality: N/A;  L1 to S1 Posterior lumbar interbody fusion   REPLACEMENT UNICONDYLAR JOINT KNEE Left ~ 2003   "~ 6 months after total knee replaced"   SHOULDER ARTHROSCOPY Left ~ 2004 X 2   "@ [facility]; left bone splinter in & had to clean it out"   TOTAL ANKLE REPLACEMENT Right 2008   at [place] Bilateral 2002   TOTAL SHOULDER REPLACEMENT Left 2006   TOTAL SHOULDER REPLACEMENT Right ~ 2007   Dr. Marlou Sa  :   Current Facility-Administered Medications:    0.9 %  sodium chloride infusion, , Intravenous, Continuous, Alekh, Kshitiz, MD, Last Rate: 75 mL/hr at 01/16/21 1939, New Bag at 01/16/21 1939   0.9 %  sodium chloride infusion, 250 mL, Intravenous, Continuous, Tilden Dome, MD, Last Rate: 10 mL/hr at 01/16/21 2231, 250 mL at 01/16/21 2231   acetaminophen (TYLENOL) tablet 650 mg, 650 mg, Oral, Q6H PRN, 650 mg at 01/16/21 0909 **OR** acetaminophen (TYLENOL) suppository 650 mg, 650 mg, Rectal, Q6H PRN, Reubin Milan, MD   albuterol (PROVENTIL) (2.5 MG/3ML) 0.083% nebulizer solution 2.5 mg, 2.5 mg, Nebulization, Q4H PRN, Reubin Milan, MD, 2.5 mg at 01/16/21 1203   apixaban (ELIQUIS) tablet 5 mg, 5 mg, Oral, BID, Reubin Milan, MD, 5 mg at 01/16/21 0954   arformoterol (BROVANA) nebulizer solution 15 mcg,  15 mcg, Nebulization, BID, Alekh, Kshitiz, MD   aztreonam (AZACTAM) 2 g in sodium chloride 0.9 % 100 mL IVPB, 2 g, Intravenous, Q8H, Green, Terri L, RPH, Stopped at 01/16/21 2305   budesonide (PULMICORT) nebulizer solution 0.25 mg, 0.25 mg, Nebulization, BID, Alekh, Kshitiz, MD   chlorhexidine (PERIDEX) 0.12 % solution 15 mL, 15 mL, Mouth Rinse, BID, Alekh, Kshitiz, MD   Chlorhexidine Gluconate Cloth 2 % PADS 6 each, 6 each, Topical, Daily, Alekh, Kshitiz, MD, 6 each at 01/16/21 2219   FLUoxetine (PROZAC) capsule 40 mg, 40 mg, Oral, Daily, Reubin Milan, MD, 40 mg at 44/81/85 6314   folic acid (FOLVITE) tablet 1 mg, 1 mg, Oral, Daily, Alekh, Kshitiz, MD   gabapentin (NEURONTIN) capsule 600 mg, 600 mg, Oral, QHS, Reubin Milan, MD, 600 mg at 01/15/21 2248   ipratropium-albuterol (DUONEB) 0.5-2.5 (3) MG/3ML nebulizer solution 3 mL, 3 mL, Nebulization, TID, Alekh, Kshitiz, MD   MEDLINE mouth rinse, 15 mL, Mouth Rinse, q12n4p, Alekh, Kshitiz, MD   norepinephrine (LEVOPHED) 23m in 2550mpremix infusion, 2-10 mcg/min, Intravenous, Titrated, Michael, Dinkels, MD, Last Rate: 7.5 mL/hr at 01/17/21 0429, 2 mcg/min at 01/17/21 0429   pantoprazole (PROTONIX) EC tablet 40 mg, 40 mg, Oral, QPM, OrReubin MilanMD, 40 mg at 01/15/21 2248   polyvinyl alcohol (LIQUIFILM TEARS) 1.4 % ophthalmic solution 1 drop, 1 drop, Both Eyes, PRN, OrReubin MilanMD   potassium chloride 10 mEq in 100 mL IVPB, 10 mEq, Intravenous, Q1 Hr x 4, Michael, Dinkels, MD, Last Rate: 100 mL/hr at 01/17/21 0504, 10 mEq at 01/17/21 0504   prochlorperazine (COMPAZINE) injection 5 mg, 5 mg, Intravenous, Q4H PRN, OrReubin MilanMD, 5 mg at 01/16/21 0014   pyridOXINE (VITAMIN B-6) tablet 50 mg, 50 mg, Oral, Daily, OrReubin MilanMD, 50 mg at 01/16/21 0953   vancomycin (VANCOCIN) IVPB 1000 mg/200 mL premix, 1,000 mg, Intravenous, Q24H, Green, Terri L, RPH, Last Rate: 200 mL/hr at 01/16/21 1717, 1,000 mg at 01/16/21  1717   vitamin B-12 (CYANOCOBALAMIN) tablet 1,000 mcg, 1,000 mcg, Oral, Daily, OrReubin MilanMD, 1,000 mcg at 01/16/21 [id number]  Facility-Administered Medications Ordered in Other Encounters:    heparin lock flush 100 unit/mL, 250 Units, Intracatheter, PRN, Celso Amy, NP   sodium chloride flush (NS) 0.9 % injection 10 mL, 10 mL, Intracatheter, PRN, Celso Amy, NP:   apixaban  5 mg Oral BID   arformoterol  15 mcg Nebulization BID   budesonide (PULMICORT) nebulizer solution  0.25 mg Nebulization BID   chlorhexidine  15 mL Mouth Rinse BID   Chlorhexidine Gluconate Cloth  6 each Topical Daily   FLUoxetine  40 mg Oral Daily   folic acid  1 mg Oral Daily   gabapentin  600 mg Oral QHS   ipratropium-albuterol  3 mL Nebulization TID   mouth rinse  15 mL Mouth Rinse q12n4p   pantoprazole  40 mg Oral QPM   pyridOXINE  50 mg Oral Daily   vitamin B-12  1,000 mcg Oral Daily  :   Allergies  Allergen Reactions   Cefepime Hives and Shortness Of Breath   Cephaeline Hives   Morphine Swelling    A swollen stomach.   Morphine And Related Shortness Of Breath, Nausea And Vomiting, Swelling and Other (See Comments)    Agitation, tolerates dilaudid Other reaction(s): Other (See Comments) Agitation, tolerates dilaudid   Penicillins Hives, Shortness Of Breath and Rash    Has patient had a PCN reaction causing immediate rash, facial/tongue/throat swelling, SOB or lightheadedness with hypotension: Yes Has patient had a PCN reaction causing severe rash involving mucus membranes or skin necrosis: Yes Has patient had a PCN reaction that required hospitalization No Has patient had a PCN reaction occurring within the last 10 years: No If all of the above answers are "NO", then may proceed with Cephalosporin use.    Doxycycline Rash   Oxycodone-Acetaminophen Other (See Comments)    Patient doesn't remember what type of reaction.  :   Family History  Problem Relation Age of Onset   Uterine  cancer Mother        uterine vs. cervix? dx 52s; deceased 41   Macular degeneration Mother    Other Mother        ankle edema   Lung cancer Mother    Melanoma Mother        multiple spots on legs   Cancer - Lung Mother        smoker   Coronary artery disease Father    Hypertension Father    Prostate cancer Father    Colon polyps Father    Cancer - Prostate Father 34       deceased 33   Heart attack Brother    Hyperlipidemia Brother    Other Brother        Schizophrenic   Thyroid disease Sister    Hemochromatosis Sister    Rheum arthritis Sister    Coronary artery disease Maternal Aunt    Heart attack Maternal Aunt    Cancer Maternal Grandmother        unk. type; deceased 30s   Diabetes Neg Hx    Colon cancer Neg Hx    Esophageal cancer Neg Hx    Rectal cancer Neg Hx    Stomach cancer Neg Hx   :   Social History   Socioeconomic History   Marital status: Significant Other    Spouse name: Not on file   Number of children: 2   Years of education: 47   Highest education level: Not on file  Occupational History   Occupation: OWNER &  SALES    Employer: [employer].    Comment: self employed  Tobacco Use   Smoking status: Never   Smokeless tobacco: Never  Vaping Use   Vaping Use: Never used  Substance and Sexual Activity   Alcohol use: No    Alcohol/week: 0.0 standard drinks    Comment: "I do not drink anymore"   Drug use: No   Sexual activity: Yes    Partners: Female  Other Topics Concern   Not on file  Social History Narrative   [employer]. Married 1972-24yr seperated/divorced. Engaged April '11. 1 son '74; step-daughter '70. 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 During '11 discovered an [employer]- $500,000+ loss. Is handling this OK - will keep the business running.   Social Determinants of Health   Financial Resource Strain:  Not on file  Food Insecurity: Not on file  Transportation Needs: Not on file  Physical Activity: Not on file  Stress: Not on file  Social Connections: Not on file  Intimate Partner Violence: Not on file  :  Pertinent items are noted in HPI.  Exam:  This is a ill-appearing white male.  He is on BiPAP.  His vital signs are temperature 98.2.  Pulse 87.  Blood pressure 128/68.  His lungs sound relatively clear bilaterally.  Cardiac exam regular rate and rhythm.  Abdomen is soft.  He has decent bowel sounds.  There is no fluid wave.  There is no palpable liver or spleen tip.  Extremity shows a dressing on the right lower leg.  He has edema in his legs.  The edema is not nearly as bad.  His right leg may be a little bit more edematous.  He has some dressings over some wounds in the left upper thigh that have not healed yet.   Patient Vitals for the past 24 hrs:  BP Temp Temp src Pulse Resp SpO2 Weight  01/17/21 0530 128/68 98.2 F (36.8 C) -- 87 20 96 % --  01/17/21 0515 (!) 97/55 98.2 F (36.8 C) -- 77 15 96 % --  01/17/21 0500 (!) 100/55 99 F (37.2 C) Bladder 78 13 96 % 149 lb 11.1 oz (67.9 kg)  01/17/21 0445 (!) 100/54 98.8 F (37.1 C) -- 77 14 96 % --  01/17/21 0430 (!) 94/54 98.8 F (37.1 C) -- 77 15 95 % --  01/17/21 0415 (!) 99/56 98.6 F (37 C) -- 80 15 96 % --  01/17/21 0400 (!) 89/57 98.6 F (37 C) Bladder 83 15 96 % --  01/17/21 0345 (!) 87/46 98.6 F (37 C) -- 88 15 95 % --  01/17/21 0330 (!) 96/53 98.4 F (36.9 C) -- 86 15 95 % --  01/17/21 0315 135/77 98.1 F (36.7 C) -- 94 14 96 % --  01/17/21 0300 (!) 118/92 97.9 F (36.6 C) Bladder 80 14 99 % --  01/17/21 0245 (!) 102/58 98.1 F (36.7 C) -- 81 11 97 % --  01/17/21 0230 108/63 97.9 F (36.6 C) -- 81 13 97 % --  01/17/21 0215 102/61 97.9 F (36.6 C) -- 81 15 97 % --  01/17/21 0200 101/61 (!) 97.5 F (36.4 C) Bladder 78 15 97 % --  01/17/21 0145 (!) 102/53 97.7 F (36.5 C) -- 76 14 95 % --  01/17/21 0130 (!)  92/51 97.7 F (36.5 C) -- 79 15 95 % --  01/17/21 0115 (!) 100/52 97.7 F (36.5 C) -- 80 15 96 % --  01/17/21 0100 (!) 85/49 (!) 97.5 F (36.4 C) Bladder 84 15 97 % --  01/17/21 0045 (!) 123/59 97.7 F (36.5 C) -- 92 (!) 22 96 % --  01/17/21 0030 124/65 (!) 97.5 F (36.4 C) -- 93 (!) 21 98 % --  01/17/21 0015 134/65 (!) 97.3 F (36.3 C) -- 88 15 100 % --  01/17/21 0000 116/75 (!) 97.3 F (36.3 C) Bladder 92 20 95 % --  01/16/21 2345 (!) 111/58 (!) 97.3 F (36.3 C) -- 79 17 99 % --  01/16/21 2330 (!) 100/59 (!) 97.3 F (36.3 C) -- 79 15 99 % --  01/16/21 2307 -- (!) 97 F (36.1 C) -- 84 15 98 % --  01/16/21 2300 127/84 -- -- 89 14 98 % --  01/16/21 2245 (!) 97/59 -- -- 79 15 97 % --  01/16/21 2230 (!) 94/53 -- -- 80 15 97 % --  01/16/21 2215 (!) 82/50 -- -- 83 15 95 % --  01/16/21 2200 (!) 78/46 -- -- 83 15 96 % --  01/16/21 2145 -- -- -- 100 (!) 21 100 % --  01/16/21 2130 (!) 111/59 -- -- 87 14 97 % --  01/16/21 2115 111/60 -- -- 87 14 98 % --  01/16/21 2100 121/68 -- -- 92 16 98 % --  01/16/21 2045 115/68 -- -- 93 15 98 % --  01/16/21 2030 121/65 -- -- 90 15 99 % --  01/16/21 2004 -- -- -- -- -- 100 % --  01/16/21 2000 120/68 -- -- 87 15 99 % --  01/16/21 1946 -- 97.8 F (36.6 C) Axillary -- -- -- --  01/16/21 1930 (!) 82/51 -- -- 78 15 100 % --  01/16/21 1913 (!) 75/50 -- -- 75 15 99 % --  01/16/21 1900 (!) 73/46 -- -- 77 15 98 % --  01/16/21 1815 (!) 109/52 -- -- 94 18 96 % --  01/16/21 1719 90/67 97.7 F (36.5 C) Axillary 92 16 94 % --  01/16/21 1630 98/60 -- -- 96 15 95 % --  01/16/21 1613 -- -- -- (!) 106 18 96 % --  01/16/21 1600 104/62 -- -- 97 17 96 % --  01/16/21 1530 107/64 -- -- 98 (!) 22 97 % --  01/16/21 1500 101/62 -- -- 94 13 97 % --  01/16/21 1436 97/64 -- -- 93 10 95 % --  01/16/21 1433 (!) 80/52 -- -- 90 11 96 % --  01/16/21 1430 (!) 76/51 -- -- 89 11 96 % --  01/16/21 1415 -- -- -- 90 12 97 % --  01/16/21 1400 (!) 77/50 -- -- 93 (!) 9 96 % --   01/16/21 1337 -- -- -- (!) 103 19 94 % --  01/16/21 1330 (!) 144/75 -- -- (!) 110 (!) 27 91 % --  01/16/21 1318 (!) 155/89 -- -- (!) 113 (!) 24 91 % --  01/16/21 1308 -- -- -- (!) 106 14 91 % --  01/16/21 1300 (!) 90/56 -- -- (!) 110 19 90 % --  01/16/21 1234 -- 97.8 F (36.6 C) Oral -- -- -- --  01/16/21 1230 (!) 102/59 -- -- (!) 112 (!) 23 93 % --  01/16/21 1200 (!) 86/53 -- -- (!) 111 14 96 % --  01/16/21 1030 (!) 89/62 -- -- (!) 112 12 97 % --  01/16/21  1000 (!) 103/49 -- -- (!) 117 (!) 23 96 % --  01/16/21 0930 97/61 -- -- (!) 118 (!) 23 98 % --  01/16/21 0911 -- (!) 100.8 F (38.2 C) Rectal -- -- -- --  01/16/21 0830 110/66 -- -- (!) 117 -- 99 % --  01/16/21 0815 -- -- -- -- (!) 23 -- --  01/16/21 0800 104/72 -- -- (!) 118 (!) 22 96 % --  01/16/21 0755 -- -- -- (!) 127 (!) 28 96 % --  01/16/21 0730 125/71 -- -- (!) 124 (!) 23 99 % --  01/16/21 0706 -- -- -- (!) 119 (!) 22 100 % --  01/16/21 0700 110/66 -- -- (!) 119 20 100 % --  01/16/21 0630 103/68 -- -- (!) 121 (!) 21 99 % --      Recent Labs    01/16/21 0612 01/17/21 0219  WBC 14.7* 6.5  HGB 10.0* 8.9*  HCT 33.7* 29.5*  PLT 169 155    Recent Labs    01/15/21 1057 01/16/21 0612 01/17/21 0219  NA 138  --  142  K 3.6  --  3.4*  CL 94*  --  105  CO2 37*  --  29  GLUCOSE 119*  --  174*  BUN 44*  --  34*  CREATININE 2.02* 1.23 0.86  CALCIUM 7.4*  --  7.6*    Blood smear review: None  Pathology: None    Assessment and Plan: Mr. Cerami is a 75 year old white male.  He has recurrent melanoma.  Again, he is on no systemic therapy for this.  We are going to try immunotherapy but he would not be able to do this because of his hospitalizations and overall poor performance status.  Para he now seen with infection.  Looks like cellulitis.  He is on antibiotics.  He is on low-dose Levophed.  His blood pressure is doing pretty well right now.  I am not sure we will be able to give him any systemic therapy.  He  just is not in good enough shape to tolerate systemic therapy.  His prognosis clearly is tied to this infection.  We have been trying to get a PET scan on him to see how his melanoma is doing.  Unfortunately, we have not been able to do this.  For right now, his immune system should be okay.  We will follow along and try to help out any way that we can.  I suspect he probably will go back to rehab after this hospitalization.  I know the ICU staff will do a fantastic job taking care of him.  I do appreciate all of their hard work.  [name], MD  Psalm 6:2

## 2021-01-17 NOTE — Progress Notes (Signed)
Pharmacy Antibiotic Note  [name] is a 75 y.o. male admitted on 01/15/2021 with LE wound infection.  Pharmacy has been consulted for Vancomycin & Aztreonam dosing. Noted severe allergy to Cefepime, Penicillins with hives, ShOB; last Cefepime charted 2015.  Admit SCr elevated at 2.02, prev SCr 0.74 on 9/21, anticipate will improve with hydration, treatment  Today, 01/17/2021 Day 3 Vanc/aztreonam SCr reduced to 0.86 WBC improved  Plan: Due to improved renal function, will change vancomycin from 1g IV q24 to 1569m IV q12 - goal AUC 400-550 Continue aztreonam 2g q8 per current renal function  Height: 5' 6"  (167.6 cm) Weight: 67.9 kg (149 lb 11.1 oz) IBW/kg (Calculated) : 63.8  Temp (24hrs), Avg:98 F (36.7 C), Min:97 F (36.1 C), Max:100.8 F (38.2 C)  Recent Labs  Lab 01/15/21 1057 01/15/21 1231 01/15/21 1318 01/16/21 0612 01/17/21 0219  WBC 15.5* 16.3*  --  14.7* 6.5  CREATININE 2.02*  --   --  1.23 0.86  LATICACIDVEN 2.6*  --  2.3*  --   --      Estimated Creatinine Clearance: 69 mL/min (by C-G formula based on SCr of 0.86 mg/dL).    Allergies  Allergen Reactions   Cefepime Hives and Shortness Of Breath   Cephaeline Hives   Morphine Swelling    A swollen stomach.   Morphine And Related Shortness Of Breath, Nausea And Vomiting, Swelling and Other (See Comments)    Agitation, tolerates dilaudid Other reaction(s): Other (See Comments) Agitation, tolerates dilaudid   Penicillins Hives, Shortness Of Breath and Rash    Has patient had a PCN reaction causing immediate rash, facial/tongue/throat swelling, SOB or lightheadedness with hypotension: Yes Has patient had a PCN reaction causing severe rash involving mucus membranes or skin necrosis: Yes Has patient had a PCN reaction that required hospitalization No Has patient had a PCN reaction occurring within the last 10 years: No If all of the above answers are "NO", then may proceed with Cephalosporin use.     Doxycycline Rash   Oxycodone-Acetaminophen Other (See Comments)    Patient doesn't remember what type of reaction.    Antimicrobials this admission: 10/10 Flagyl x1  10/10 Vancomycin >>  10/10 Aztreonam >>  Dose adjustments this admission:  Microbiology results: 10/10 BCx: ngtd 10/10 UCx: multiple species  Thank you for allowing pharmacy to be a part of this patient's care.   JAdrian Saran PharmD, BCPS Secure Chat if ?s 01/17/2021 8:44 AM

## 2021-01-17 NOTE — Consult Note (Signed)
Consultation Note Date: 01/17/2021   Patient Name: [name]  DOB: 1945/09/01  MRN: [id number]  Age / Sex: 75 y.o., male  PCP: Plotnikov, Evie Lacks, MD Referring Physician: Laurin Coder, MD  Reason for Consultation: Establishing goals of care  HPI/Patient Profile: 75 y.o. male admitted on 01/15/2021   [name] is a 75 y.o. male with a PMH significant for CAD, CHF, COPD, maligant melanoma, paroxysmal A-fib on Eliquis, HLD, prior DVT and PE, RA, HTN, and ETOH use who presented to the ED after suffering a fall at [facility] Clinical Assessment and Goals of Care:  Patient sees Dr Marin Olp for melanoma, found to be recurrent, based on PET scan in April 2022. Patient underwent excision of L thigh nodule, excision of L inguinal lymph node and with lymphedema post biopsy. Patient admitted with cellulitis of LLE recently.  Remains in stepdown unit on BIPAP and pressors. Palliative consult for goals of care discussions.  Patient is awake alert, no distress, on BIPAP. Introduced myself and palliative care as follows: Palliative medicine is specialized medical care for people living with serious illness. It focuses on providing relief from the symptoms and stress of a serious illness. The goal is to improve quality of life for both the patient and the family. Goals of care: Broad aims of medical therapy in relation to the patient's values and preferences. Our aim is to provide medical care aimed at enabling patients to achieve the goals that matter most to them, given the circumstances of their particular medical situation and their constraints.   Code status re discussed, patient endorses DNR. He doesn't want to be a burden on his family. He is aware of the serious nature of his illness. Goals, wishes and values discussed. Call placed and discussed with wife [name] as well. She is  thankful for the care the patient is receiving, she is in touch with ICU RNs about the patient's condition. See below.   NEXT OF KIN    SUMMARY OF RECOMMENDATIONS    Agree with DNR: discussed with patient as well as with wife [name] on the phone. Continue current mode of care, patient and wife remain hopeful for some degree of ongoing stabilization/recovery. Likely for SNF rehab with palliative services on discharge versus home based care.  Monitor hospital course and overall disease trajectory of illness to help guide further goals of care discussions.  Thank you for the consult.   Code Status/Advance Care Planning: DNR   Symptom Management:     Palliative Prophylaxis:  Bowel Regimen  Additional Recommendations (Limitations, Scope, Preferences): Full Scope Treatment  Psycho-social/Spiritual:  Desire for further Chaplaincy support:yes Additional Recommendations: Caregiving  Support/Resources  Prognosis:  Unable to determine  Discharge Planning: To Be Determined      Primary Diagnoses: Present on Admission:  Sepsis due to cellulitis [facility])  Anxiety  Essential hypertension  Coronary atherosclerosis  COPD (chronic obstructive pulmonary disease) (HCC)  Open thigh wound  Pulmonary embolus (HCC)  Lumbar post-laminectomy syndrome  Atrial fibrillation ([place])  Sepsis ([place])   I have reviewed the medical record, interviewed the patient and family, and examined the patient. The following aspects are pertinent.  Past Medical History:  Diagnosis Date   Alcoholism /alcohol abuse    per family   Allergic rhinitis    Allergy    Anxiety    Bacterial infection    CAD (coronary artery disease)    minimal coronary plaque in the LAD and right coronary system. 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 7-06. Last nuclear stress 11-17-06 fixed anterior/ inferior defect, no inducible ischemia, EF 81%   Cataract    CHF  (congestive heart failure) (HCC)    Chronic back pain    "all over back"   Chronic neck pain    COPD (chronic obstructive pulmonary disease) ([place])    Depression    Diverticulosis    DVT (deep venous thrombosis) ([place]) 06/2020   Dyspnea    with exertion   Dysrhythmia 01/24/2012   past hx. A.Fib x1 episode-responded to med.   Falls frequently    "since 02/2013" (06/16/2013)   Family history of cancer    Genetic testing 03/06/2017   Multi-Cancer panel (83 genes) @ [facility] - No pathogenic mutations detected   GERD (gastroesophageal reflux disease)    Goals of care, counseling/discussion 10/12/2020   Hemochromatosis    dx'd 14 yrs ago last ferritin Aug 11, 08 52 (22-322), Fe 136 ("I had 250 phlebotomies for that")   High cholesterol    hx   History of radiation therapy 11/06/2020   left inguinal thigh 10/10/2020-11/06/2020  Dr Gery Pray   Hx of colonic polyps    Hx of colonoscopy    Hypertension    Iron deficiency anemia due to chronic blood loss 12/27/2016   Malignant melanoma of knee, left (HCC)    Myocardial infarction [facility]) 2006   "related to catheterization"   Narcotic abuse ([place])    per family   Neuropathy    fingers and toes   Osteoarthritis    Peripheral edema    Bilateral ankles   PONV (postoperative nausea and vomiting)    Pulmonary embolism ([place]) 06/2020   RA (rheumatoid arthritis) ([place])    Social History   Socioeconomic History   Marital status: Significant Other    Spouse name: Not on file   Number of children: 2   Years of education: 16   Highest education level: Not on file  Occupational History   Occupation: OWNER & Scientist, 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    Comment: self employed  Tobacco Use   Smoking status: Never   Smokeless tobacco: Never  Vaping Use   Vaping Use: Never used  Substance and Sexual Activity   Alcohol use: No    Alcohol/week: 0.0 standard drinks    Comment: "I do not drink anymore"   Drug use: No   Sexual activity: Yes    Partners:  Female  Other Topics Concern   Not on file  Social History Narrative   [employer]. Married 1972-48yr seperated/divorced. Engaged April '11. 1 son '74; step-daughter '70. 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 During '11 discovered an [employer]- $500,000+ loss. Is handling this OK - will keep the business running.   Social Determinants of Health   Financial Resource Strain: Not on file  Food Insecurity: Not on file  Transportation Needs: Not on file  Physical Activity: Not on file  Stress: Not on file  Social Connections: Not on file   Family History  Problem Relation Age of Onset   Uterine cancer Mother        uterine vs. cervix? dx 60s; deceased 48   Macular degeneration Mother    Other Mother        ankle edema   Lung cancer Mother    Melanoma Mother        multiple spots on legs   Cancer - Lung Mother        smoker   Coronary artery disease Father    Hypertension Father    Prostate cancer Father    Colon polyps Father    Cancer - Prostate Father 3       deceased 65   Heart attack Brother    Hyperlipidemia Brother    Other Brother        Schizophrenic   Thyroid disease Sister    Hemochromatosis Sister    Rheum arthritis Sister    Coronary artery disease Maternal Aunt    Heart attack Maternal Aunt    Cancer Maternal Grandmother        unk. type; deceased 79s   Diabetes Neg Hx    Colon cancer Neg Hx    Esophageal cancer Neg Hx    Rectal cancer Neg Hx    Stomach cancer Neg Hx    Scheduled Meds:  apixaban  5 mg Oral BID   arformoterol  15 mcg Nebulization BID   budesonide (PULMICORT) nebulizer solution  0.25 mg Nebulization BID   chlorhexidine  15 mL Mouth Rinse BID   Chlorhexidine Gluconate Cloth  6 each Topical Daily   FLUoxetine  40 mg Oral Daily   folic acid  1 mg Oral Daily   gabapentin  600 mg Oral QHS    ipratropium-albuterol  3 mL Nebulization TID   mouth rinse  15 mL Mouth Rinse q12n4p   pantoprazole  40 mg Oral QPM   pyridOXINE  50 mg Oral Daily   vitamin B-12  1,000 mcg Oral Daily   Continuous Infusions:  sodium chloride 75 mL/hr at 01/17/21 1255   sodium chloride 250 mL (01/16/21 2231)   aztreonam 2 g (01/17/21 1311)   norepinephrine (LEVOPHED) Adult infusion 1 mcg/min (01/17/21 1255)   vancomycin     PRN Meds:.acetaminophen **OR** acetaminophen, albuterol, HYDROmorphone, polyvinyl alcohol, prochlorperazine Medications Prior to Admission:  Prior to Admission medications   Medication Sig Start Date End Date Taking? Authorizing Provider  albuterol (PROVENTIL HFA;VENTOLIN HFA) 108 (90 Base) MCG/ACT inhaler Inhale 1-2 puffs into the lungs every 4 (four) hours as needed for wheezing or shortness of breath. 09/11/16  Yes Plotnikov, Evie Lacks, MD  ALPRAZolam Duanne Moron) 0.5 MG tablet Take 1 tablet (0.5 mg total) by mouth 3 (three) times daily as needed for anxiety. for anxiety Patient taking differently: Take 0.5 mg by mouth 3 (three) times daily as needed for anxiety. 01/10/21  Yes Plotnikov, Evie Lacks, MD  apixaban (ELIQUIS) 5 MG TABS tablet Take 1 tablet (5 mg total) by mouth 2 (two) times daily. 06/06/20 01/15/21 Yes Plotnikov, Evie Lacks, MD  Cholecalciferol (VITAMIN D3) 2000 UNITS capsule Take 1 capsule (2,000 Units total) by mouth daily. 12/05/14  Yes Plotnikov, Evie Lacks, MD  FLUoxetine (PROZAC) 40 MG capsule Take 1 capsule (40 mg total) by mouth daily. 01/01/21  Yes Eugenie Filler, MD  fluticasone [facility]) 50 MCG/ACT nasal spray  INSTILL 2 SPRAYS IN EACH NOSTRIL EVERY DAY Patient taking differently: Place 2 sprays into both nostrils daily as needed for allergies. 11/19/17  Yes Plotnikov, Evie Lacks, MD  furosemide (LASIX) 20 MG tablet Take 20 mg by mouth 2 (two) times daily.   Yes [provider]  gabapentin (NEURONTIN) 300 MG capsule Take 2 capsules (600 mg total) by mouth at  bedtime. 11/02/20  Yes Plotnikov, Evie Lacks, MD  HYDROmorphone (DILAUDID) 2 MG tablet Take 1-1.5 tablets (2-3 mg total) by mouth every 8 (eight) hours as needed for severe pain. 01/10/21  Yes Plotnikov, Evie Lacks, MD  lidocaine-prilocaine (EMLA) cream Apply 1 application topically as needed. Patient taking differently: Apply 1 application topically daily as needed (pain). 05/25/20  Yes Celso Amy, NP  Melatonin 10 MG TABS Take 10 mg by mouth at bedtime.   Yes [provider]  metoprolol tartrate (LOPRESSOR) 25 MG tablet TAKE 1 TABLET TWO TIMES DAILY. Patient taking differently: Take 25 mg by mouth 2 (two) times daily. 11/06/20  Yes Plotnikov, Evie Lacks, MD  mirtazapine (REMERON) 7.5 MG tablet Take 7.5 mg by mouth at bedtime. 01/05/21  Yes [provider]  mupirocin ointment (BACTROBAN) 2 % On leg wound w/dressing change qd or bid Patient taking differently: Apply 1 application topically daily. On leg wound w/dressing change qd or bid 11/02/20  Yes Plotnikov, Evie Lacks, MD  pantoprazole (PROTONIX) 40 MG tablet Take 1 tablet (40 mg total) by mouth every evening. Patient taking differently: Take 40 mg by mouth 2 (two) times daily. 06/19/20  Yes Plotnikov, Evie Lacks, MD  polyvinyl alcohol (LIQUIFILM TEARS) 1.4 % ophthalmic solution Place 1 drop into both eyes daily as needed for dry eyes.   Yes [provider]  predniSONE (DELTASONE) 5 MG tablet Take 10 mg by mouth daily. 06/19/20  Yes [provider]  Pyridoxine HCl (B-6 PO) Take 1 tablet by mouth daily.   Yes [provider]  temazepam (RESTORIL) 15 MG capsule Take 1 capsule (15 mg total) by mouth at bedtime as needed for sleep. 10/12/20  Yes [name], MD  traZODone (DESYREL) 100 MG tablet Take 100 mg by mouth at bedtime.   Yes [provider]  vitamin B-12 (CYANOCOBALAMIN) 1000 MCG tablet Take 1,000 mcg by mouth daily.    Yes [provider]  FLUoxetine (PROZAC) 20 MG capsule Take 1  capsule (20 mg total) by mouth daily for 5 days. Patient not taking: Reported on 01/15/2021 12/26/20 01/15/21  Eugenie Filler, MD  torsemide [facility]) 100 MG tablet Take 0.5-1 tablets (50-100 mg total) by mouth daily as needed. Patient not taking: Reported on 01/15/2021 07/05/20   Plotnikov, Evie Lacks, MD   Allergies  Allergen Reactions   Cefepime Hives and Shortness Of Breath   Cephaeline Hives   Morphine Swelling    A swollen stomach.   Morphine And Related Shortness Of Breath, Nausea And Vomiting, Swelling and Other (See Comments)    Agitation, tolerates dilaudid Other reaction(s): Other (See Comments) Agitation, tolerates dilaudid   Penicillins Hives, Shortness Of Breath and Rash    Has patient had a PCN reaction causing immediate rash, facial/tongue/throat swelling, SOB or lightheadedness with hypotension: Yes Has patient had a PCN reaction causing severe rash involving mucus membranes or skin necrosis: Yes Has patient had a PCN reaction that required hospitalization No Has patient had a PCN reaction occurring within the last 10 years: No If all of the above answers are "NO", then may proceed  with Cephalosporin use.    Doxycycline Rash   Oxycodone-Acetaminophen Other (See Comments)    Patient doesn't remember what type of reaction.   Review of Systems +weakness  Physical Exam Patient was on BIPAP when seen earlier today Is able to converse on the BIPAP Appears awake alert Decreased air movement S 1 S 2  Abdomen not distended Edema LE  Vital Signs: BP 119/63   Pulse 93   Temp 99.5 F (37.5 C)   Resp (!) 22   Ht 5' 6"  (1.676 m)   Wt 67.9 kg   SpO2 95%   BMI 24.16 kg/m  Pain Scale: 0-10   Pain Score: 0-No pain   SpO2: SpO2: 95 % O2 Device:SpO2: 95 % O2 Flow Rate: .O2 Flow Rate (L/min): 2 L/min  IO: Intake/output summary:  Intake/Output Summary (Last 24 hours) at 01/17/2021 1347 Last data filed at 01/17/2021 1255 Gross per 24 hour  Intake 3352.62 ml   Output 1775 ml  Net 1577.62 ml    LBM: Last BM Date:  (PTA) Baseline Weight: Weight: 66.9 kg Most recent weight: Weight: 67.9 kg     Palliative Assessment/Data:   PPS 50%  Time In:  1330 Time Out:  1430 Time Total:  60  Greater than 50%  of this time was spent counseling and coordinating care related to the above assessment and plan.  Signed by: Loistine Chance, MD   Please contact Palliative Medicine Team phone at [phone] for questions and concerns.  For individual provider: See Shea Evans

## 2021-01-17 NOTE — TOC Initial Note (Signed)
Transition of Care [facility] - Initial/Assessment Note   Patient Details  Name: [name] MRN: [id number] Date of Birth: Sep 10, 1945  [facility]) CM/SW Contact:    Sherie Don, LCSW Phone Number: 01/17/2021, 1:22 PM  Clinical Narrative: CSW spoke with patient's wife, [name], to complete assessment as patient has not been as alert since admission. Per wife, patient was recently discharged home to [facility] after about a week and a half at a SNF. Patient requires a rolling walker and requires limited assistance with ADLs due to weakness. Patient was transporting himself to appointments prior to SNF last month.  Wife reported the patient had a wound specialist assisting the patient Monday-Friday in the home. [facility] cannot accept the patient for PT and OT at the ALF until his leg wounds improve. Wife reported the patient will likely prefer to return home rather than go back to SNF for rehab. TOC to follow.  Expected Discharge Plan: Assisted Living Barriers to Discharge: Continued Medical Work up  Patient Goals and CMS Choice Patient states their goals for this hospitalization and ongoing recovery are:: Return home if possible CMS Medicare.gov Compare Post Acute Care list provided to:: Patient Represenative (must comment) Choice offered to / list presented to : Spouse  Expected Discharge Plan and Services Expected Discharge Plan: Assisted Living In-house Referral: Clinical Social Work Living arrangements for the past 2 months: [place]             DME Arranged: N/A DME Agency: NA  Prior Living Arrangements/Services Living arrangements for the past 2 months: [place] Lives with:: Spouse Patient language and need for interpreter reviewed:: Yes Do you feel safe going back to the place where you live?: Yes      Need for Family Participation in Patient Care: Yes (Comment) Care giver support system in place?: Yes (comment) Criminal  Activity/Legal Involvement Pertinent to Current Situation/Hospitalization: No - Comment as needed  Activities of Daily Living Home Assistive Devices/Equipment: Grab bars around toilet, Grab bars in shower, Hand-held shower hose, Walker (specify type), Wheelchair (front wheeled walker) ADL Screening (condition at time of admission) Patient's cognitive ability adequate to safely complete daily activities?: No Is the patient deaf or have difficulty hearing?: No Does the patient have difficulty seeing, even when wearing glasses/contacts?: No Does the patient have difficulty concentrating, remembering, or making decisions?: Yes Patient able to express need for assistance with ADLs?: No Does the patient have difficulty dressing or bathing?: Yes Independently performs ADLs?: No (worsening weakness) Communication: Independent Dressing (OT): Dependent Is this a change from baseline?: Pre-admission baseline Grooming: Dependent Is this a change from baseline?: Pre-admission baseline Feeding: Dependent Is this a change from baseline?: Pre-admission baseline Bathing: Dependent Is this a change from baseline?: Pre-admission baseline Toileting: Dependent Is this a change from baseline?: Pre-admission baseline In/Out Bed: Dependent Is this a change from baseline?: Pre-admission baseline Walks in Home: Dependent Is this a change from baseline?: Pre-admission baseline Does the patient have difficulty walking or climbing stairs?: Yes (secondary to worsening weakness fall) Weakness of Legs: Both Weakness of Arms/Hands: Both  Emotional Assessment Orientation: : Oriented to Self, Oriented to Place, Oriented to Situation Alcohol / Substance Use: Not Applicable Psych Involvement: No (comment)  Admission diagnosis:  Dyspnea [R06.00] Sepsis ([place]) [A41.9] Sepsis due to cellulitis ([place]) [L03.90, A41.9] Cellulitis, unspecified cellulitis site [L03.90] Patient Active Problem List   Diagnosis Date Noted    Sepsis ([place]) 01/16/2021   Sepsis due to cellulitis ([place]) 01/15/2021  Prolonged QT interval 01/15/2021   Pulmonary nodules 01/15/2021   AKI (acute kidney injury) ([place]) 01/15/2021   Open thigh wound    Citrobacter infection    Pseudomonas infection    Pressure injury of skin 12/21/2020   Fall    Hyperbilirubinemia    Malignant melanoma ([place])    Cellulitis 12/20/2020   Blisters of multiple sites 11/05/2020   Goals of care, counseling/discussion 10/12/2020   Atherosclerosis of aorta ([place]) 07/24/2020   Symptomatic anemia 05/27/2020   Pulmonary embolus ([place]) 05/27/2020   Acute respiratory failure with hypoxia ([place]) 05/26/2020   Callus of foot 04/21/2018   Edema 03/27/2018   Toxic gastroenteritis and colitis 03/25/2018   Other ulcerative 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 ([place]) [id number]/56/7014   Diarrhea 12/23/2017   Nausea & vomiting 12/23/2017   Herpes zoster 07/07/2017   Constipation 04/21/2017   Genetic testing 03/06/2017   Family history of cancer    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[place]) 02/12/2017   Iron deficiency anemia due to chronic blood loss 12/27/2016   Scalp wound 06/13/2016   Scalp laceration, subsequent encounter 06/04/2016   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   Pain in joint, ankle and foot 09/28/2015   Anemia due to chronic blood loss 09/28/2015   Hyponatremia 09/28/2015   Cramp in limb 09/28/2015   Exertional angina ([place]) 06/17/2014   DOE (dyspnea on exertion) 04/28/2014   Hypokalemia 03/11/2014   Counseling regarding goals of care 12/31/2013   Impaired glucose tolerance 12/31/2013   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[place]) 11/18/2013   Fever 11/18/2013   Leukocytosis 10/30/2013   Fatigue 10/22/2013   Hoarseness of voice 08/10/2013   Sleep disorder 07/05/2013   Tension headache 07/01/2013   Polypharmacy 06/19/2013   Lumbar post-laminectomy syndrome 05/27/2013   Degenerative arthritis of spine 05/26/2013   Hemochromatosis 05/26/2013   Wound infection after  surgery 05/26/2013   Normocytic anemia 05/26/2013   Unintentional weight loss 05/26/2013   Protein-calorie malnutrition, severe ([place]) 05/25/2013   Chronic pain syndrome 05/23/2013   COPD (chronic obstructive pulmonary disease) ([place]) 05/06/2013   Frequent falls 05/06/2013   Encounter for postoperative wound care 05/06/2013   Dehydration 03/17/2013   Atrial fibrillation ([place]) 08/07/2010   Rheumatoid arthritis ([place]) 10/18/2008   Anxiety 11/09/2007   Elevated lipids 03/30/2007   PSEUDOGOUT 03/30/2007   Essential hypertension 03/30/2007   Coronary atherosclerosis 03/30/2007   PCP:  Cassandria Anger, MD Pharmacy:   [facility] [address] [place] [address] Phone: [phone] Fax: [phone]  Readmission Risk Interventions Readmission Risk Prevention Plan 01/17/2021  Transportation Screening Complete  HRI or Home Care Consult Complete  SW Recovery Care/Counseling Consult Complete  Palliative Care Screening (No Data)  Some recent data might be hidden

## 2021-01-17 NOTE — Progress Notes (Signed)
NAME:  [name], MRN:  [id number], DOB:  05-29-1945, LOS: 2 ADMISSION DATE:  01/15/2021, CONSULTATION DATE:  01/16/21 REFERRING MD:  Dr Starla Link, CHIEF COMPLAINT:  sepsis   History of Present Illness:  [name] is a 75 y.o. male with a PMH significant for CAD, CHF, COPD, maligant melanoma, paroxysmal A-fib on Eliquis, HLD, prior DVT and PE, RA, HTN, and ETOH use who presented to the ED after suffering a fall at [facility] independent [facility]. On EMS arrival patient was seen hypotensive and received IV hydration.    On ED arrival patient met criteria for sepsis with tachycardia, tachypnea, fever, lactic acidosis and leukocytosis with acute signs of cellulitis. Hospitality were consulted and patient was admitted for further management.    Following day while still boarding in the ED patient became more hypoxic and hypercapnic with need of BIPAP therapy. PCCM consulted for further management, note patient is a DNR.    Pertinent medical timeline 08/03/20 saw Dr. Marin Olp who informed the patient of the recurrent melanoma based off of PET scan taken on 07/31/20; which showed a very active lymph node in the patients left inguinal area that measured 2.4 cm 08/15/2020 underwent Excision of left thigh nodule 2 x 1 cm, subcutaneous, excision of deep left inguinal lymph node, excision of abdominal wall nodule 2x2 cm > path revealed metastatic melanoma  Lymphedema resulted post biopsy  9/14-9/21 admitted at this facility for sepsis secondary to cellulitis of left leg, wound culture noted to have grown Citrobacter on 8/172022 and received Bactrim in outpatient setting. Patient maintained on IV vancomycin during the hospitalization and subsequently transition to oral Zyvox on day of discharge to complete 5 more days per ID recommendations.   Pertinent  Medical History   Past Medical History:  Diagnosis Date   Alcoholism /alcohol abuse    per family   Allergic rhinitis    Allergy     Anxiety    Bacterial infection    CAD (coronary artery disease)    minimal coronary plaque in the LAD and right coronary system. 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 7-06. Last nuclear stress 11-17-06 fixed anterior/ inferior defect, no inducible ischemia, EF 81%   Cataract    CHF (congestive heart failure) (HCC)    Chronic back pain    "all over back"   Chronic neck pain    COPD (chronic obstructive pulmonary disease) ([place])    Depression    Diverticulosis    DVT (deep venous thrombosis) ([place]) 06/2020   Dyspnea    with exertion   Dysrhythmia 01/24/2012   past hx. A.Fib x1 episode-responded to med.   Falls frequently    "since 02/2013" (06/16/2013)   Family history of cancer    Genetic testing 03/06/2017   Multi-Cancer panel (83 genes) @ [facility] - No pathogenic mutations detected   GERD (gastroesophageal reflux disease)    Goals of care, counseling/discussion 10/12/2020   Hemochromatosis    dx'd 14 yrs ago last ferritin Aug 11, 08 52 (22-322), Fe 136 ("I had 250 phlebotomies for that")   High cholesterol    hx   History of radiation therapy 11/06/2020   left inguinal thigh 10/10/2020-11/06/2020  Dr Gery Pray   Hx of colonic polyps    Hx of colonoscopy    Hypertension    Iron deficiency anemia due to chronic blood loss 12/27/2016   Malignant melanoma of knee, left (HCC)    Myocardial infarction (  [place]) 2006   "related to catheterization"   Narcotic abuse ([place])    per family   Neuropathy    fingers and toes   Osteoarthritis    Peripheral edema    Bilateral ankles   PONV (postoperative nausea and vomiting)    Pulmonary embolism ([place]) 06/2020   RA (rheumatoid arthritis) ([place])     Significant Hospital Events: Including procedures, antibiotic start and stop dates in addition to other pertinent events   10/10 admitted with sepsis secondary cellulitis  Interim History / Subjective:  Pressor  requirement Still somnolent  Objective   Blood pressure 126/64, pulse 86, temperature 97.9 F (36.6 C), resp. rate 15, height 5' 6"  (1.676 m), weight 67.9 kg, SpO2 97 %.    FiO2 (%):  [30 %-40 %] 30 %   Intake/Output Summary (Last 24 hours) at 01/17/2021 0946 Last data filed at 01/17/2021 9735 Gross per 24 hour  Intake 3043.54 ml  Output 1475 ml  Net 1568.54 ml   Filed Weights   01/15/21 1700 01/17/21 0500  Weight: 66.9 kg 67.9 kg    Examination: General: Elderly, does not appear to be in extremis, responsive HENT: BiPAP mask in place Lungs: Decreased air movement bilaterally Cardiovascular: S1-S2 appreciated Abdomen: Bowel sounds appreciated Extremities: No clubbing, does have edema, redness with swelling right lower extremity Neuro: Alert and interactive GU: Fair output  Lab details significant for normal BUN/creatinine ABG with hypercapnic failure Chronic anemia  Resolved Hospital Problem list     Assessment & Plan:  Acute hypoxemic respiratory failure Hypercapnic respiratory failure -More alert and interactive -Was able to wean off BiPAP this morning  Severe sepsis Requiring pressors -Continue antibiotics -Continue to wean pressors as tolerated -Continue fluid resuscitation  Acute kidney injury -Continue to trend electrolytes -Maintain renal perfusion  Hypertension -Continue to monitor closely -Hold antihypertensives at present  Paroxysmal atrial fibrillation -on anticoagulation -Metoprolol on hold  History of pulmonary embolism -On Eliquis  Chronic obstructive pulmonary disease As needed bronchodilators  Lower extremity cellulitis -Vancomycin and aztreonam  History of anxiety -Home fluoxetine and alprazolam as needed  Best Practice (right click and "Reselect all SmartList Selections" daily)   Diet/type: Regular consistency (see orders) DVT prophylaxis: DOAC GI prophylaxis: PPI Lines: N/A Foley:  N/A Code Status:  DNR Last date of  multidisciplinary goals of care discussion [will follow]  Labs   CBC: Recent Labs  Lab 01/15/21 1057 01/15/21 1231 01/16/21 0612 01/17/21 0219  WBC 15.5* 16.3* 14.7* 6.5  NEUTROABS 13.5* 14.0* 13.1* 6.0  HGB 10.2* 10.9* 10.0* 8.9*  HCT 33.9* 35.5* 33.7* 29.5*  MCV 98.5 98.1 102.4* 99.3  PLT 194 189 169 329    Basic Metabolic Panel: Recent Labs  Lab 01/15/21 1057 01/16/21 0612 01/17/21 0219  NA 138  --  142  K 3.6  --  3.4*  CL 94*  --  105  CO2 37*  --  29  GLUCOSE 119*  --  174*  BUN 44*  --  34*  CREATININE 2.02* 1.23 0.86  CALCIUM 7.4*  --  7.6*  MG  --   --  2.3   GFR: Estimated Creatinine Clearance: 69 mL/min (by C-G formula based on SCr of 0.86 mg/dL). Recent Labs  Lab 01/15/21 1057 01/15/21 1231 01/15/21 1318 01/16/21 0612 01/17/21 0219  WBC 15.5* 16.3*  --  14.7* 6.5  LATICACIDVEN 2.6*  --  2.3*  --   --     Liver Function Tests: Recent Labs  Lab 01/15/21 1057 01/17/21  0219  AST 36 21  ALT 16 16  ALKPHOS 46 40  BILITOT 0.9 1.2  PROT 5.5* 4.7*  ALBUMIN 3.0* 2.4*   No results for input(s): LIPASE, AMYLASE in the last 168 hours. No results for input(s): AMMONIA in the last 168 hours.  ABG    Component Value Date/Time   PHART 7.208 (L) 01/16/2021 1252   PCO2ART 74.6 (HH) 01/16/2021 1252   PO2ART 180 (H) 01/16/2021 1252   HCO3 27.9 01/16/2021 2043   TCO2 29 05/06/2013 1438   ACIDBASEDEF 0.3 01/16/2021 2043   O2SAT 54.3 01/16/2021 2043     Coagulation Profile: Recent Labs  Lab 01/15/21 1057  INR 2.1*    Cardiac Enzymes: No results for input(s): CKTOTAL, CKMB, CKMBINDEX, TROPONINI in the last 168 hours.  HbA1C: Hgb A1c MFr Bld  Date/Time Value Ref Range Status  09/28/2015 12:23 PM 6.0 4.6 - 6.5 % Final    Comment:    Glycemic Control Guidelines for People with Diabetes:Non Diabetic:  <6%Goal of Therapy: <7%Additional Action Suggested:  >8%   12/31/2013 12:08 PM 6.0 4.6 - 6.5 % Final    Comment:    Glycemic Control Guidelines  for People with Diabetes:Non Diabetic:  <6%Goal of Therapy: <7%Additional Action Suggested:  >8%     CBG: No results for input(s): GLUCAP in the last 168 hours.  Review of Systems:   Denies pain or discomfort  Past Medical History:  He,  has a past medical history of Alcoholism /alcohol abuse, Allergic rhinitis, Allergy, Anxiety, Bacterial infection, CAD (coronary artery disease), Cataract, CHF (congestive heart failure) ([place]), Chronic back pain, Chronic neck pain, COPD (chronic obstructive pulmonary disease) ([place]), Depression, Diverticulosis, DVT (deep venous thrombosis) ([place]) (06/2020), Dyspnea, Dysrhythmia (01/24/2012), Falls frequently, Family history of cancer, Genetic testing (03/06/2017), GERD (gastroesophageal reflux disease), Goals of care, counseling/discussion (10/12/2020), Hemochromatosis, High cholesterol, History of radiation therapy (11/06/2020), colonic polyps, colonoscopy, Hypertension, Iron deficiency anemia due to chronic blood loss (12/27/2016), Malignant melanoma of knee, left ([place]), Myocardial infarction ([place]) (2006), Narcotic abuse ([place]), Neuropathy, Osteoarthritis, Peripheral edema, PONV (postoperative nausea and vomiting), Pulmonary embolism ([place]) (06/2020), and RA (rheumatoid arthritis) ([place]).   Surgical History:   Past Surgical History:  Procedure Laterality Date   ABDOMINAL ADHESION SURGERY  ~ 1968   ANKLE RECONSTRUCTION Right 6-09   [facility]   APPENDECTOMY  ~ [place] Left 03/27/2017   Procedure: ASPIRATION OF SEROMA;  Surgeon: Stark Klein, MD;  Location: [place];  Service: General;  Laterality: Left;   BONE TUMOR RESECTION  ~ 1954   "taken off my mastoid"   [place] Right 1990's   CATARACT EXTRACTION, BILATERAL Bilateral 01-24-12   CORONARY ANGIOPLASTY WITH STENT PLACEMENT  2006   "while repairing 1st stent, a second area tore and they had to place 2nd stent " ?LAD & CX   CORONARY ANGIOPLASTY WITH STENT  PLACEMENT  06/17/2014   CYST EXCISION  "several OR's"   "backX 2, back of my neck, face, inside right bicept, chest, wrist"   FOOT SURGERY Right 11-08   for removal of bone spurs-   HAMMER TOE SURGERY Right 07/2012   "broke 4 hammertoes"    HARDWARE REMOVAL  03/09/2012   Procedure: HARDWARE REMOVAL;  Surgeon: Nita Sells, MD;  Location: [facility];  Service: Orthopedics;  Laterality: Left;  Hardware Removal from Left Shoulder   HARVEST BONE GRAFT  02/06/2012   Procedure: HARVEST ILIAC BONE GRAFT;  Surgeon: Francesco Runner  Tamera Punt, MD;  Location: [facility];  Service: Orthopedics;;  bone marrow aspirqation    INGUINAL HERNIA REPAIR Bilateral    JOINT REPLACEMENT     KNEE SURGERY Left ~ 2003   "6-12 months after uni knee removed synovial sack"   LEFT HEART CATHETERIZATION WITH CORONARY ANGIOGRAM N/A 06/17/2014   PCI of diffuse severe stenosis in the proximal to mid LAD using overlapping drug-eluting stents.   LUMBAR WOUND DEBRIDEMENT N/A 03/17/2013   Procedure: Incision and drainage of superficial lumbar wound;  Surgeon: Floyce Stakes, MD;  Location: [facility];  Service: Neurosurgery;  Laterality: N/A;  Incision and drainage of superficial lumbar wound   LYMPH NODE BIOPSY N/A 08/15/2020   Procedure: EXCISIONAL LYMPH NODE BIOPSY LEFT INGUINAL REGION, EXCISION OF LEFT THIGH NODULE, EXCISION OF LEFT ABDOMINAL WALL LESION;  Surgeon: Stark Klein, MD;  Location: [facility];  Service: General;  Laterality: N/A;   MECKEL DIVERTICULUM EXCISION  ~ [place] LYMPH NODE BIOPSY Left 02/12/2017   WIDE LOCAL EXCISION LEFT KNEE MELANOMA, ADVANCEMENT FLAP CLOSURE, AND SENTINEL LYMPH NODE MAPPING AND BIOPSY.   MELANOMA EXCISION WITH SENTINEL LYMPH NODE BIOPSY Left 02/12/2017   Procedure: WIDE LOCAL EXCISION LEFT KNEE MELANOMA, ADVANCEMENT FLAP CLOSURE, AND SENTINEL LYMPH NODE MAPPING AND BIOPSY.;  Surgeon: Stark Klein, MD;  Location: [facility];  Service: General;   Laterality: Left;  GENERAL AND LOCAL   ORIF SHOULDER FRACTURE  02/06/2012   Procedure: OPEN REDUCTION INTERNAL FIXATION (ORIF) SHOULDER FRACTURE;  Surgeon: Nita Sells, MD;  Location[facility];  Service: Orthopedics;  Laterality: Left;  ORIF of a Left Shoulder Fracture with  Iliac Crest Bone Graft aspiration    PORTACATH PLACEMENT Left 03/27/2017   Procedure: INSERTION PORT-A-CATH;  Surgeon: Stark Klein, MD;  Location: [place];  Service: General;  Laterality: Left;   POSTERIOR LUMBAR FUSION  12-10   L4-5 diskectomy w/ fusion, cage placement and rods; [place]   POSTERIOR LUMBAR FUSION 4 LEVEL N/A 03/02/2013   Procedure: Lumbar One to Sacral One Posterior lumbar interbody fusion;  Surgeon: Floyce Stakes, MD;  Location: [place] [facility];  Service: Neurosurgery;  Laterality: N/A;  L1 to S1 Posterior lumbar interbody fusion   REPLACEMENT UNICONDYLAR JOINT KNEE Left ~ 2003   "~ 6 months after total knee replaced"   SHOULDER ARTHROSCOPY Left ~ 2004 X 2   "@ [facility]; left bone splinter in & had to clean it out"   TOTAL ANKLE REPLACEMENT Right 2008   at [place] Bilateral 2002   TOTAL SHOULDER REPLACEMENT Left 2006   TOTAL SHOULDER REPLACEMENT Right ~ 2007   Dr. Marlou Sa     Social History:   reports that he has never smoked. He has never used smokeless tobacco. He reports that he does not drink alcohol and does not use drugs.   Family History:  His family history includes Cancer in his maternal grandmother; Cancer - Lung in his mother; Cancer - Prostate (age of onset: 89) in his father; Colon polyps in his father; Coronary artery disease in his father and maternal aunt; Heart attack in his brother and maternal aunt; Hemochromatosis in his sister; Hyperlipidemia in his brother; Hypertension in his father; Lung cancer in his mother; Macular degeneration in his mother; Melanoma in his mother; Other in his brother and mother; Prostate cancer in his father;  Rheum arthritis in his sister; Thyroid disease in his sister; Uterine cancer in his mother. There  is no history of Diabetes, Colon cancer, Esophageal cancer, Rectal cancer, or Stomach cancer.   Allergies Allergies  Allergen Reactions   Cefepime Hives and Shortness Of Breath   Cephaeline Hives   Morphine Swelling    A swollen stomach.   Morphine And Related Shortness Of Breath, Nausea And Vomiting, Swelling and Other (See Comments)    Agitation, tolerates dilaudid Other reaction(s): Other (See Comments) Agitation, tolerates dilaudid   Penicillins Hives, Shortness Of Breath and Rash    Has patient had a PCN reaction causing immediate rash, facial/tongue/throat swelling, SOB or lightheadedness with hypotension: Yes Has patient had a PCN reaction causing severe rash involving mucus membranes or skin necrosis: Yes Has patient had a PCN reaction that required hospitalization No Has patient had a PCN reaction occurring within the last 10 years: No If all of the above answers are "NO", then may proceed with Cephalosporin use.    Doxycycline Rash   Oxycodone-Acetaminophen Other (See Comments)    Patient doesn't remember what type of reaction.    The patient is critically ill with multiple organ systems failure and requires high complexity decision making for assessment and support, frequent evaluation and titration of therapies, application of advanced monitoring technologies and extensive interpretation of multiple databases. Critical Care Time devoted to patient care services described in this note independent of APP/resident time (if applicable)  is 45 minutes.   Sherrilyn Rist MD [facility] Personal pager: See Amion If unanswered, please page CCM On-call: [phone]

## 2021-01-18 ENCOUNTER — Encounter: Payer: Medicare Other | Admitting: Rehabilitation

## 2021-01-18 ENCOUNTER — Inpatient Hospital Stay (HOSPITAL_COMMUNITY): Payer: Medicare Other

## 2021-01-18 DIAGNOSIS — A419 Sepsis, unspecified organism: Secondary | ICD-10-CM | POA: Diagnosis not present

## 2021-01-18 DIAGNOSIS — R609 Edema, unspecified: Secondary | ICD-10-CM

## 2021-01-18 DIAGNOSIS — N179 Acute kidney failure, unspecified: Secondary | ICD-10-CM | POA: Diagnosis not present

## 2021-01-18 DIAGNOSIS — R6521 Severe sepsis with septic shock: Secondary | ICD-10-CM

## 2021-01-18 DIAGNOSIS — L0390 Cellulitis, unspecified: Secondary | ICD-10-CM | POA: Diagnosis not present

## 2021-01-18 DIAGNOSIS — G9341 Metabolic encephalopathy: Secondary | ICD-10-CM

## 2021-01-18 DIAGNOSIS — Z515 Encounter for palliative care: Secondary | ICD-10-CM | POA: Diagnosis not present

## 2021-01-18 LAB — BASIC METABOLIC PANEL
Anion gap: 4 — ABNORMAL LOW (ref 5–15)
BUN: 27 mg/dL — ABNORMAL HIGH (ref 8–23)
CO2: 29 mmol/L (ref 22–32)
Calcium: 8.1 mg/dL — ABNORMAL LOW (ref 8.9–10.3)
Chloride: 109 mmol/L (ref 98–111)
Creatinine, Ser: 0.61 mg/dL (ref 0.61–1.24)
GFR, Estimated: >60 mL/min (ref >60)
Glucose, Bld: 196 mg/dL — ABNORMAL HIGH (ref 70–99)
Potassium: 3.6 mmol/L (ref 3.5–5.1)
Sodium: 142 mmol/L (ref 135–145)

## 2021-01-18 LAB — MAGNESIUM: Magnesium: 2.4 mg/dL (ref 1.7–2.4)

## 2021-01-18 LAB — CBC
HCT: 27.5 % — ABNORMAL LOW (ref 39.0–52.0)
Hemoglobin: 8.2 g/dL — ABNORMAL LOW (ref 13.0–17.0)
MCH: 29.8 pg (ref 26.0–34.0)
MCHC: 29.8 g/dL — ABNORMAL LOW (ref 30.0–36.0)
MCV: 100 fL (ref 80.0–100.0)
Platelets: 179 10*3/uL (ref 150–400)
RBC: 2.75 MIL/uL — ABNORMAL LOW (ref 4.22–5.81)
RDW: 14.4 % (ref 11.5–15.5)
WBC: 9.2 10*3/uL (ref 4.0–10.5)
nRBC: 0 % (ref 0.0–0.2)

## 2021-01-18 LAB — PHOSPHORUS: Phosphorus: 1.9 mg/dL — ABNORMAL LOW (ref 2.5–4.6)

## 2021-01-18 MED ORDER — MELATONIN 3 MG PO TABS
3.0000 mg | ORAL_TABLET | Freq: Once | ORAL | Status: AC
  Administered 2021-01-18: 3 mg via ORAL
  Filled 2021-01-18: qty 1

## 2021-01-18 MED ORDER — METOPROLOL TARTRATE 25 MG PO TABS
25.0000 mg | ORAL_TABLET | Freq: Two times a day (BID) | ORAL | Status: DC
  Administered 2021-01-18 – 2021-01-24 (×12): 25 mg via ORAL
  Filled 2021-01-18 (×13): qty 1

## 2021-01-18 MED ORDER — GERHARDT'S BUTT CREAM
TOPICAL_CREAM | Freq: Two times a day (BID) | CUTANEOUS | Status: DC
  Administered 2021-01-22 – 2021-01-23 (×2): 1 via TOPICAL
  Filled 2021-01-18: qty 1

## 2021-01-18 MED ORDER — TAMSULOSIN HCL 0.4 MG PO CAPS
0.4000 mg | ORAL_CAPSULE | Freq: Every day | ORAL | Status: DC
  Administered 2021-01-18 – 2021-01-24 (×7): 0.4 mg via ORAL
  Filled 2021-01-18 (×7): qty 1

## 2021-01-18 MED ORDER — TRAZODONE HCL 50 MG PO TABS
50.0000 mg | ORAL_TABLET | ORAL | Status: AC | PRN
  Administered 2021-01-18: 50 mg via ORAL
  Filled 2021-01-18: qty 1

## 2021-01-18 MED ORDER — POTASSIUM PHOSPHATES 15 MMOLE/5ML IV SOLN
30.0000 mmol | Freq: Once | INTRAVENOUS | Status: AC
  Administered 2021-01-18: 30 mmol via INTRAVENOUS
  Filled 2021-01-18: qty 10

## 2021-01-18 MED ORDER — SENNOSIDES-DOCUSATE SODIUM 8.6-50 MG PO TABS
1.0000 | ORAL_TABLET | Freq: Two times a day (BID) | ORAL | Status: DC
  Administered 2021-01-18 – 2021-01-24 (×12): 1 via ORAL
  Filled 2021-01-18 (×13): qty 1

## 2021-01-18 MED ORDER — POLYETHYLENE GLYCOL 3350 17 G PO PACK
17.0000 g | PACK | Freq: Every day | ORAL | Status: DC | PRN
  Administered 2021-01-18: 17 g via ORAL
  Filled 2021-01-18: qty 1

## 2021-01-18 MED ORDER — BISACODYL 10 MG RE SUPP: 10.0000 mg | Freq: Every day | RECTAL | Status: DC | PRN

## 2021-01-18 NOTE — Evaluation (Signed)
Physical Therapy Evaluation Patient Details Name: [name] MRN: [id number] DOB: [birth year] Today's Date: 01/18/2021  History of Present Illness  Patient 75 year old man history of malignant melanoma L thigh, paroxysmal A. fib, RA, COPD, HTN,hemochromatosis, chronic back pain, anemia, anxiety, falls, ETOH abuse presented  01/15/21 with sepsis, L LE cellulitis, dehydration. Required Bipap and pressors.  Clinical Impression  The patient is eager to mobilize. Patient comes from Home in independent living with wife. Patient would like to return home. Patient recently in hospital and  went to SNF then back home.  Patient on 1 L [place]   94%. On RA dropped to 87, replaced on 2 L to ambulate, dropped to 88% , increased to 3 L SPO2 with SPO2 > 90%. To ambulated x 30'. Back to 1 l after return to room. Pt admitted with above diagnosis.   Pt currently with functional limitations due to the deficits listed below (see PT Problem List). Pt will benefit from skilled PT to increase their independence and safety with mobility to allow discharge to the venue listed below.        Recommendations for follow up therapy are one component of a multi-disciplinary discharge planning process, led by the attending physician.  Recommendations may be updated based on patient status, additional functional criteria and insurance authorization.  Follow Up Recommendations SNF;Home health PT depending on progress.    Equipment Recommendations  None recommended by PT    Recommendations for Other Services       Precautions / Restrictions Precautions Precautions: Fall Precaution Comments: watch O2      Mobility  Bed Mobility Overal bed mobility: Needs Assistance Bed Mobility: Supine to Sit     Supine to sit: Mod assist     General bed mobility comments: assist to raise trunk    Transfers Overall transfer level: Needs assistance Equipment used: Rolling walker (2 wheeled) Transfers: Sit to/from  Stand Sit to Stand: Mod assist         General transfer comment: assist to rise and steady  Ambulation/Gait Ambulation/Gait assistance: Min assist Gait Distance (Feet): 30 Feet Assistive device: Rolling walker (2 wheeled) Gait Pattern/deviations: Step-to pattern;Step-through pattern Gait velocity: decr   General Gait Details: cues for posture, step length, reliant on RW, 3/4 dyspnea  Stairs            Wheelchair Mobility    Modified Rankin (Stroke Patients Only)       Balance Overall balance assessment: History of Falls;Needs assistance Sitting-balance support: Feet supported Sitting balance-Leahy Scale: Fair     Standing balance support: No upper extremity supported Standing balance-Leahy Scale: Poor Standing balance comment: reliant on RW                             Pertinent Vitals/Pain Pain Assessment: Faces Faces Pain Scale: No hurt    Home Living Family/patient expects to be discharged to:: Private residence Living Arrangements: Spouse/significant other Available Help at Discharge: Available 24 hours/day Type of Home: Independent living facility Home Access: Level entry     Home Layout: One level Home Equipment: Walker - 2 wheels;Other (comment) Additional Comments: lift chair    Prior Function Level of Independence: Needs assistance   Gait / Transfers Assistance Needed: using RW  ADL's / Homemaking Assistance Needed: assisted as needed for dressing, depending on pain/mobility  Comments: Sleeps in a lift char     Hand Dominance   Dominant Hand: Right  Extremity/Trunk Assessment   Upper Extremity Assessment Upper Extremity Assessment: RUE deficits/detail;LUE deficits/detail RUE Deficits / Details: longstanding limited shoulder ROM, arthritic changes in hand RUE Coordination: decreased gross motor;decreased fine motor LUE Deficits / Details: longstanding shoulder limitations, arthritic changes in hand LUE Coordination:  decreased fine motor;decreased gross motor    Lower Extremity Assessment Lower Extremity Assessment: Defer to PT evaluation RLE Deficits / Details: charcot  foot deformity, edema, WFL for amb. LLE Deficits / Details: edema > rt, charcot deformity    Cervical / Trunk Assessment Cervical / Trunk Assessment: Kyphotic  Communication   Communication: No difficulties  Cognition Arousal/Alertness: Awake/alert Behavior During Therapy: WFL for tasks assessed/performed Overall Cognitive Status: Within Functional Limits for tasks assessed                                        General Comments      Exercises     Assessment/Plan    PT Assessment Patient needs continued PT services  PT Problem List Decreased strength;Decreased mobility;Decreased safety awareness;Decreased range of motion;Decreased knowledge of precautions;Decreased activity tolerance;Decreased balance;Cardiopulmonary status limiting activity;Pain       PT Treatment Interventions DME instruction;Therapeutic activities;Gait training;Therapeutic exercise;Patient/family education;Functional mobility training;Balance training    PT Goals (Current goals can be found in the Care Plan section)  Acute Rehab PT Goals Patient Stated Goal: return home PT Goal Formulation: With patient Time For Goal Achievement: 02/01/21 Potential to Achieve Goals: Fair    Frequency Min 2X/week   Barriers to discharge        Co-evaluation PT/OT/SLP Co-Evaluation/Treatment: Yes Reason for Co-Treatment: Complexity of the patient's impairments (multi-system involvement);For patient/therapist safety PT goals addressed during session: Mobility/safety with mobility OT goals addressed during session: ADL's and self-care       AM-PAC PT "6 Clicks" Mobility  Outcome Measure Help needed turning from your back to your side while in a flat bed without using bedrails?: A Little Help needed moving from lying on your back to sitting  on the side of a flat bed without using bedrails?: A Little Help needed moving to and from a bed to a chair (including a wheelchair)?: A Lot Help needed standing up from a chair using your arms (e.g., wheelchair or bedside chair)?: A Lot Help needed to walk in hospital room?: A Lot Help needed climbing 3-5 steps with a railing? : Total 6 Click Score: 13    End of Session Equipment Utilized During Treatment: Gait belt Activity Tolerance: Patient tolerated treatment well Patient left: in chair;with call bell/phone within reach;with chair alarm set Nurse Communication: Mobility status PT Visit Diagnosis: Unsteadiness on feet (R26.81);History of falling (Z91.81);Difficulty in walking, not elsewhere classified (R26.2)    Time: 7893-8101 PT Time Calculation (min) (ACUTE ONLY): 31 min   Charges:   PT Evaluation $PT Eval Low Complexity: [facility] Acute Rehabilitation Services Pager [phone] Office [phone]   Claretha Cooper 01/18/2021, 12:15 PM

## 2021-01-18 NOTE — Progress Notes (Signed)
[facility] ADULT ICU REPLACEMENT PROTOCOL   The patient does apply for the [facility] Adult ICU Electrolyte Replacment Protocol based on the criteria listed below:   1.Exclusion criteria: TCTS patients, ECMO patients and Hypothermia Protocol, and   Dialysis patients 2. Is GFR >/= 30 ml/min? Yes.    Patient's GFR today is >60 3. Is SCr </= 2? Yes.   Patient's SCr is 0.61 mg/dL 4. Did SCr increase >/= 0.5 in 24 hours? No. 5.Pt's weight >40kg  Yes.   6. Abnormal electrolyte(s): K 3.6, Phos 1.9  7. Electrolytes replaced per protocol   Christeen Douglas 01/18/2021 4:50 AM

## 2021-01-18 NOTE — Progress Notes (Signed)
Mr. Haggar looks a lot better.  He is off the BiPAP.  He is on nasal cannula oxygen.  His O2 sat is 96%.  Cultures were all negative.  I think that he is off his pressors.  His labs show BUN of 27 creatinine 0.61.  His calcium is 8.1.  His white cell count is 9.2.  Hemoglobin 8.2.  Platelet count 179,000.  We will have to watch his hemoglobin closely.  I think that he may need to be transfused if he gets below 8.  Hopefully, he will continue to improve.  His vital signs show temperature 97.5.  Pulse 91.  Blood pressure 136/70.  His lungs sound clear bilaterally.  Cardiac exam regular rate and rhythm.  He has no murmurs.  Abdomen is soft.  Bowel sounds are present but decreased.  He has no fluid wave.  There is no palpable liver or spleen tip.  Extremities shows dressings on his right lower leg.  He has some edema in his legs but not as bad.  Neurological exam is nonfocal.  Mr. Alldredge does have the recurrent melanoma.  He had surgery for lymph node resection and then radiation therapy afterwards.  We have not yet given him any systemic therapy-which would be immunotherapy.  Am awaiting the PET scan.  We cannot do this with him as an inpatient.  Again, the hemoglobin will be watched.  Again if he gets below 8, I would certainly consider transfusing him.  [name], MD  2 Cor 3:12

## 2021-01-18 NOTE — Progress Notes (Signed)
NAME:  [name], MRN:  [id number], DOB:  [birth year], LOS: 3 ADMISSION DATE:  01/15/2021, CONSULTATION DATE:  01/16/21 REFERRING MD:  Dr Starla Link, CHIEF COMPLAINT:  sepsis   History of Present Illness:  [name] is a 75 y.o. male with a PMH significant for CAD, CHF, COPD, maligant melanoma, paroxysmal A-fib on Eliquis, HLD, prior DVT and PE, RA, HTN, and ETOH use who presented to the ED after suffering a fall at [facility]. On EMS arrival patient was seen hypotensive and received IV hydration.    On ED arrival patient met criteria for sepsis with tachycardia, tachypnea, fever, lactic acidosis and leukocytosis with acute signs of cellulitis. Hospitality were consulted and patient was admitted for further management.    Following day while still boarding in the ED patient became more hypoxic and hypercapnic with need of BIPAP therapy. PCCM consulted for further management, note patient is a DNR.    Pertinent medical timeline 08/03/20 saw Dr. Marin Olp who informed the patient of the recurrent melanoma based off of PET scan taken on 07/31/20; which showed a very active lymph node in the patients left inguinal area that measured 2.4 cm 08/15/2020 underwent Excision of left thigh nodule 2 x 1 cm, subcutaneous, excision of deep left inguinal lymph node, excision of abdominal wall nodule 2x2 cm > path revealed metastatic melanoma  Lymphedema resulted post biopsy  9/14-9/21 admitted at this facility for sepsis secondary to cellulitis of left leg, wound culture noted to have grown Citrobacter on 8/172022 and received Bactrim in outpatient setting. Patient maintained on IV vancomycin during the hospitalization and subsequently transition to oral Zyvox on day of discharge to complete 5 more days per ID recommendations.   Pertinent  Medical History   Past Medical History:  Diagnosis Date   Alcoholism /alcohol abuse    per family   Allergic rhinitis    Allergy     Anxiety    Bacterial infection    CAD (coronary artery disease)    minimal coronary plaque in the LAD and right coronary system. 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 7-06. Last nuclear stress 11-17-06 fixed anterior/ inferior defect, no inducible ischemia, EF 81%   Cataract    CHF (congestive heart failure) (HCC)    Chronic back pain    "all over back"   Chronic neck pain    COPD (chronic obstructive pulmonary disease) ([place])    Depression    Diverticulosis    DVT (deep venous thrombosis) ([place]) 06/2020   Dyspnea    with exertion   Dysrhythmia 01/24/2012   past hx. A.Fib x1 episode-responded to med.   Falls frequently    "since 02/2013" (06/16/2013)   Family history of cancer    Genetic testing 03/06/2017   Multi-Cancer panel (83 genes) @ [facility] - No pathogenic mutations detected   GERD (gastroesophageal reflux disease)    Goals of care, counseling/discussion 10/12/2020   Hemochromatosis    dx'd 14 yrs ago last ferritin Aug 11, 08 52 (22-322), Fe 136 ("I had 250 phlebotomies for that")   High cholesterol    hx   History of radiation therapy 11/06/2020   left inguinal thigh 10/10/2020-11/06/2020  Dr Gery Pray   Hx of colonic polyps    Hx of colonoscopy    Hypertension    Iron deficiency anemia due to chronic blood loss 12/27/2016   Malignant melanoma of knee, left (HCC)    Myocardial infarction (  [place]) 2006   "related to catheterization"   Narcotic abuse ([place])    per family   Neuropathy    fingers and toes   Osteoarthritis    Peripheral edema    Bilateral ankles   PONV (postoperative nausea and vomiting)    Pulmonary embolism ([place]) 06/2020   RA (rheumatoid arthritis) ([place])     Significant Hospital Events: Including procedures, antibiotic start and stop dates in addition to other pertinent events   10/10 admitted with sepsis secondary cellulitis Improving status  Interim History / Subjective:  Off  pressors Off BiPAP Objective   Blood pressure (!) 146/70, pulse 87, temperature 98.1 F (36.7 C), resp. rate 20, height 5' 6"  (1.676 m), weight 71.2 kg, SpO2 98 %.        Intake/Output Summary (Last 24 hours) at 01/18/2021 1147 Last data filed at 01/18/2021 0900 Gross per 24 hour  Intake 3361.67 ml  Output 925 ml  Net 2436.67 ml   Filed Weights   01/15/21 1700 01/17/21 0500 01/18/21 0458  Weight: 66.9 kg 67.9 kg 71.2 kg    Examination: General: Elderly, awake and interactive HENT: Moist oral mucosa Lungs: Decreased air movement bilaterally Cardiovascular: S1-S2 appreciated Abdomen: Bowel sounds appreciated Extremities: No clubbing, some redness in the right lower extremity  neuro: Alert and interactive GU: Fair output  Lab details significant for normal BUN/creatinine ABG with hypercapnic failure Chronic anemia  Resolved Hospital Problem list     Assessment & Plan:  Acute hypoxemic respiratory failure Hypercapnic respiratory failure -More alert and interactive -Was able to wean off BiPAP this morning -Has remained off BiPAP  Severe sepsis Off pressors for 18 hours -Fluid support -Continue antibiotics  Acute kidney injury -Maintain renal perfusion Continue to trend electrolytes  Hypertension -Home antihypertensives on hold  Paroxysmal atrial fibrillation -on anticoagulation -Metoprolol on hold  History of pulmonary embolism -On Eliquis  Chronic obstructive pulmonary disease -as needed bronchodilators  Lower extremity cellulitis -Vancomycin and aztreonam  History of anxiety -Home fluoxetine and alprazolam as needed    PCCM will sign off, call as needed   Sherrilyn Rist, MD [place] PCCM Pager: See Shea Evans

## 2021-01-18 NOTE — Progress Notes (Addendum)
[name] ID: [name], male   DOB: 10/26/1945, 75 y.o.   MRN: [id number]  PROGRESS NOTE    TRAYQUAN KOLAKOWSKI  XTA:[id number] DOB: December 10, 1945 DOA: 01/15/2021 PCP: Cassandria Anger, MD   Brief Narrative:  75 y.o. male with medical history significant of alcohol abuse, allergic rhinitis, anxiety, CAD, chronic diastolic heart failure, chronic back pain, COPD, depression, diverticulosis, history of DVT and PE, history of single episode of A. fib, hemochromatosis, GERD, frequent falls, hyperlipidemia, chronic blood loss anemia, narcotic abuse history, history of neuropathy, osteoarthritis, rheumatoid arthritis presented from SNF weakness and multiple wounds on lower extremity.  On presentation, temperature was 103.2 with tachypnea.  White count was 15.5, lactic acid was 2.6 and then 2.3, creatinine of 2.02.  He was started on broad-spectrum antibiotics.  He subsequently required BiPAP and also had to be briefly started on pressors; he was transferred to ICU and PCCM took over his care on 01/17/2021.  He is off pressors currently and care has been transferred back to [facility] on 01/18/2021.  Assessment & Plan:   Septic shock: Evolving on admission Lower extremity cellulitis -Presented with fever, tachypnea, elevated lactic acid and leukocytosis with evidence of lower extremity cellulitis -Cellulitis more evident on the right lower extremity. -Currently still on vancomycin and Azactam.  DC Azactam.  Continue vancomycin alone.  COVID-19 and influenza testing were negative on presentation. -Patient had to be transferred to ICU briefly and started on pressors; PCCM took over his care on 01/17/2021.  He is currently off pressors and care has been transferred back to [facility] on 01/18/2021. -Blood pressure currently much improved.  Blood cultures negative so far.  Urine cultures grew multiple species.  Currently still on IV fluids.  Oral intake improving.  DC IV fluids today. -Transfer him out of  ICU.  COPD Acute hypoxic respiratory failure -Respiratory status worsened after hospitalization and patient required BiPAP (respiratory status probably worsened after oral Dilaudid use) -Pulmonary evaluation appreciated.  Currently off BiPAP.  Currently on 1 L oxygen via nasal cannula.  Acute metabolic encephalopathy -Possibly from above and narcotic use.  Mental status improved after 1 dose of Narcan on 01/16/2021.  Dilaudid orally has been restarted on 01/17/2021 by PCCM team.  I discussed with patient regarding decreasing the dose but he is adamant that he wants to continue the current dose which he has been on for 6-7 years. -Monitor mental status.  Acute kidney injury -Presented with creatinine of 2.02.  Resolved.  Creatinine 0.61 today.  If oral intake is improving, will possibly DC IV fluids.  Leukocytosis -Resolved.  Macrocytic anemia -Questionable cause.  Hemoglobin stable.  Continue B12 supplementation.  Continue home empiric folic acid supplementation.  Essential hypertension -Blood pressure on the lower side.  Antihypertensives on hold  Paroxysmal A. fib -Currently intermittently tachycardic.  Metoprolol on hold.  Once blood pressure improves consistently, will have to resume metoprolol.  Continue Eliquis  History of pulmonary embolism -Continue Eliquis  Lower extremity wounds: Present on admission -Follow wound care recommendations  Recurrent melanoma Chronic pain -Oncology following. -Pain management plan as above.  ?  Pulmonary nodules on x-ray -We will need follow-up as an outpatient with PCP/pulmonary  Coronary atherosclerosis -Continue apixaban.  Beta-blocker on hold  Anxiety -Continue fluoxetine.  Alprazolam on hold  Generalized deconditioning -PT eval  DVT prophylaxis: Eliquis Code Status: DNR Family Communication: None at bedside Disposition Plan: Status is: Inpatient  Remains inpatient appropriate because:Inpatient level of care appropriate  due to severity of illness  Dispo: The patient is from: SNF              Anticipated d/c is to: SNF versus home with home health.              Patient currently is not medically stable to d/c.   Difficult to place patient No  Consultants: PCCM/oncology/palliative care  Procedures: None  Antimicrobials: Azactam and vancomycin from 01/15/2021 onwards   Subjective: Patient seen and examined at bedside.  Poor historian.  Feels much better this morning.  Still complains of some lower extremity pain.  Breathing is improving.  No overnight fever, chest pain or worsening shortness of breath reported. Objective: Vitals:   01/18/21 0458 01/18/21 0500 01/18/21 0600 01/18/21 0700  BP:  (!) 147/74 136/70 135/70  Pulse:  83 91 84  Resp:  18 16 (!) 22  Temp:  (!) 97.2 F (36.2 C) (!) 97.5 F (36.4 C) (!) 97.2 F (36.2 C)  TempSrc:      SpO2:  96% 94% 93%  Weight: 71.2 kg     Height:        Intake/Output Summary (Last 24 hours) at 01/18/2021 0829 Last data filed at 01/18/2021 0700 Gross per 24 hour  Intake 3924.38 ml  Output 925 ml  Net 2999.38 ml    Filed Weights   01/15/21 1700 01/17/21 0500 01/18/21 0458  Weight: 66.9 kg 67.9 kg 71.2 kg    Examination:  General exam: No distress.  Looks chronically ill and deconditioned.  On 1 L oxygen by nasal cannula Respiratory system: Decreased breath sounds at bases bilaterally with some crackles  Symptomatic cardiovascular system: Currently rate controlled; S1-S2 heard gastrointestinal system: Abdomen is mildly distended, soft and nontender.  Bowel sounds are heard Extremities: Mild lower extremity edema present bilaterally; no clubbing  Central nervous system: Awake, answers some questions appropriately.  Poor historian.  No focal neurological deficits.  Moves extremities  skin: Bilateral lower extremity chronic skin changes with erythema and tenderness with wounds in the left thigh with dressing and right lower extremity skin tear.  No  obvious ecchymosis Psychiatry: Flat affect.  Participates in conversation more today.  Data Reviewed: I have personally reviewed following labs and imaging studies  CBC: Recent Labs  Lab 01/15/21 1057 01/15/21 1231 01/16/21 0612 01/17/21 0219 01/18/21 0345  WBC 15.5* 16.3* 14.7* 6.5 9.2  NEUTROABS 13.5* 14.0* 13.1* 6.0  --   HGB 10.2* 10.9* 10.0* 8.9* 8.2*  HCT 33.9* 35.5* 33.7* 29.5* 27.5*  MCV 98.5 98.1 102.4* 99.3 100.0  PLT 194 189 169 155 637    Basic Metabolic Panel: Recent Labs  Lab 01/15/21 1057 01/16/21 0612 01/17/21 0219 01/18/21 0345  NA 138  --  142 142  K 3.6  --  3.4* 3.6  CL 94*  --  105 109  CO2 37*  --  29 29  GLUCOSE 119*  --  174* 196*  BUN 44*  --  34* 27*  CREATININE 2.02* 1.23 0.86 0.61  CALCIUM 7.4*  --  7.6* 8.1*  MG  --   --  2.3 2.4  PHOS  --   --   --  1.9*    GFR: Estimated Creatinine Clearance: 74.2 mL/min (by C-G formula based on SCr of 0.61 mg/dL). Liver Function Tests: Recent Labs  Lab 01/15/21 1057 01/17/21 0219  AST 36 21  ALT 16 16  ALKPHOS 46 40  BILITOT 0.9 1.2  PROT 5.5* 4.7*  ALBUMIN 3.0* 2.4*    No  results for input(s): LIPASE, AMYLASE in the last 168 hours. No results for input(s): AMMONIA in the last 168 hours. Coagulation Profile: Recent Labs  Lab 01/15/21 1057  INR 2.1*    Cardiac Enzymes: No results for input(s): CKTOTAL, CKMB, CKMBINDEX, TROPONINI in the last 168 hours. BNP (last 3 results) No results for input(s): PROBNP in the last 8760 hours. HbA1C: No results for input(s): HGBA1C in the last 72 hours. CBG: No results for input(s): GLUCAP in the last 168 hours. Lipid Profile: No results for input(s): CHOL, HDL, LDLCALC, TRIG, CHOLHDL, LDLDIRECT in the last 72 hours. Thyroid Function Tests: No results for input(s): TSH, T4TOTAL, FREET4, T3FREE, THYROIDAB in the last 72 hours. Anemia Panel: No results for input(s): VITAMINB12, FOLATE, FERRITIN, TIBC, IRON, RETICCTPCT in the last 72  hours. Sepsis Labs: Recent Labs  Lab 01/15/21 1057 01/15/21 1318  LATICACIDVEN 2.6* 2.3*     Recent Results (from the past 240 hour(s))  Culture, blood (Routine x 2)     Status: None (Preliminary result)   Collection Time: 01/15/21 10:57 AM   Specimen: BLOOD  Result Value Ref Range Status   Specimen Description   Final    BLOOD LEFT ANTECUBITAL Performed at [facility] [address] [place], [place] [address]    Special Requests   Final    BOTTLES DRAWN AEROBIC AND ANAEROBIC Blood Culture adequate volume Performed at [facility] [address], [place], [place] [address]    Culture   Final    NO GROWTH 2 DAYS Performed at [facility] [address], [place], [place] [address]    Report Status PENDING  Incomplete  Culture, blood (Routine x 2)     Status: None (Preliminary result)   Collection Time: 01/15/21 12:31 PM   Specimen: BLOOD  Result Value Ref Range Status   Specimen Description   Final    BLOOD SITE NOT SPECIFIED Performed at [facility] [address], [place], [place] [address]    Special Requests   Final    BOTTLES DRAWN AEROBIC AND ANAEROBIC Blood Culture adequate volume Performed at [facility] [address], [place], [place] [address]    Culture   Final    NO GROWTH 2 DAYS Performed at [facility] [address] [place], [place] [address]    Report Status PENDING  Incomplete  Resp Panel by RT-PCR (Flu A&B, Covid)     Status: None   Collection Time: 01/15/21 12:31 PM   Specimen: Nasopharyngeal(NP) swabs in vial transport medium  Result Value Ref Range Status   SARS Coronavirus 2 by RT PCR NEGATIVE NEGATIVE Final    Comment: (NOTE) SARS-CoV-2 target nucleic acids are NOT DETECTED.  The SARS-CoV-2 RNA is generally detectable in upper respiratory specimens during the acute phase of infection. The lowest concentration of SARS-CoV-2 viral copies this  assay can detect is 138 copies/mL. A negative result does not preclude SARS-Cov-2 infection and should not be used as the sole basis for treatment or other patient management decisions. A negative result may occur with  improper specimen collection/handling, submission of specimen other than nasopharyngeal swab, presence of viral mutation(s) within the areas targeted by this assay, and inadequate number of viral copies(<138 copies/mL). A negative result must be combined with clinical observations, patient history, and epidemiological information. The expected result is Negative.  Fact Sheet for Patients:  EntrepreneurPulse.com.au  Fact Sheet for Healthcare Providers:  IncredibleEmployment.be  This test  is no t yet approved or cleared by the [place] and  has been authorized for detection and/or diagnosis of SARS-CoV-2 by FDA under an Emergency Use Authorization (EUA). This EUA will remain  in effect (meaning this test can be used) for the duration of the COVID-19 declaration under Section 564(b)(1) of the Act, 21 U.S.C.section 360bbb-3(b)(1), unless the authorization is terminated  or revoked sooner.       Influenza A by PCR NEGATIVE NEGATIVE Final   Influenza B by PCR NEGATIVE NEGATIVE Final    Comment: (NOTE) The Xpert Xpress SARS-CoV-2/FLU/RSV plus assay is intended as an aid in the diagnosis of influenza from Nasopharyngeal swab specimens and should not be used as a sole basis for treatment. Nasal washings and aspirates are unacceptable for Xpert Xpress SARS-CoV-2/FLU/RSV testing.  Fact Sheet for Patients: EntrepreneurPulse.com.au  Fact Sheet for Healthcare Providers: IncredibleEmployment.be  This test is not yet approved or cleared by the [place] FDA and has been authorized for detection and/or diagnosis of SARS-CoV-2 by FDA under an Emergency Use Authorization (EUA). This EUA will  remain in effect (meaning this test can be used) for the duration of the COVID-19 declaration under Section 564(b)(1) of the Act, 21 U.S.C. section 360bbb-3(b)(1), unless the authorization is terminated or revoked.  Performed at [facility], [place] [address] [place], [place] [address]   Urine Culture     Status: Abnormal   Collection Time: 01/15/21 11:00 PM   Specimen: In/Out Cath Urine  Result Value Ref Range Status   Specimen Description   Final    IN/OUT CATH URINE Performed at [facility] [address], [place], [place] [address]    Special Requests   Final    NONE Performed at [facility], [place] [address] [place], [place] [address]    Culture MULTIPLE SPECIES PRESENT, SUGGEST RECOLLECTION (A)  Final   Report Status 01/16/2021 FINAL  Final  MRSA Next Gen by PCR, Nasal     Status: None   Collection Time: 01/16/21  6:05 PM   Specimen: Nasal Mucosa; Nasal Swab  Result Value Ref Range Status   MRSA by PCR Next Gen NOT DETECTED NOT DETECTED Final    Comment: (NOTE) The GeneXpert MRSA Assay (FDA approved for NASAL specimens only), is one component of a comprehensive MRSA colonization surveillance program. It is not intended to diagnose MRSA infection nor to guide or monitor treatment for MRSA infections. Test performance is not FDA approved in patients less than 34 years old. Performed at [facility], [place] [address] [place], [place] [address]           Radiology Studies: DG CHEST PORT 1 VIEW  Result Date: 01/16/2021 CLINICAL DATA:  Shortness of breath EXAM: PORTABLE CHEST 1 VIEW COMPARISON:  01/15/2021 FINDINGS: Elevation of the right hemidiaphragm. Worsening bilateral airspace disease. Heart is upper limits normal in size. Aortic atherosclerosis. Left Port-A-Cath remains in place, unchanged. Suspect small layering effusions. IMPRESSION: Worsening bilateral airspace disease which could  reflect edema or infection. Suspect small layering effusions. Electronically Signed   By: Rolm Baptise M.D.   On: 01/16/2021 12:16        Scheduled Meds:  (feeding supplement) PROSource Plus  30 mL Oral BID BM   apixaban  5 mg Oral BID   chlorhexidine  15 mL Mouth Rinse BID   Chlorhexidine Gluconate Cloth  6 each Topical Daily   feeding supplement  237 mL Oral BID BM  FLUoxetine  40 mg Oral Daily   folic acid  1 mg Oral Daily   gabapentin  600 mg Oral QHS   Gerhardt's butt cream   Topical BID   ipratropium-albuterol  3 mL Nebulization TID   mouth rinse  15 mL Mouth Rinse q12n4p   mometasone-formoterol  2 puff Inhalation BID   multivitamin with minerals  1 tablet Oral Daily   nutrition supplement (JUVEN)  1 packet Oral BID BM   pantoprazole  40 mg Oral QPM   pyridOXINE  50 mg Oral Daily   tamsulosin  0.4 mg Oral Daily   vitamin B-12  1,000 mcg Oral Daily   Continuous Infusions:  sodium chloride 75 mL/hr at 01/18/21 0700   sodium chloride 250 mL (01/16/21 2231)   aztreonam Stopped (01/18/21 0541)   norepinephrine (LEVOPHED) Adult infusion Stopped (01/17/21 1259)   potassium PHOSPHATE IVPB (in mmol) 85 mL/hr at 01/18/21 0700   vancomycin Stopped (01/17/21 1624)          Aline August, MD [facility] 01/18/2021, 8:29 AM

## 2021-01-18 NOTE — Plan of Care (Signed)
  Problem: Health Behavior/Discharge Planning: Goal: Ability to manage health-related needs will improve Outcome: Progressing   Problem: Clinical Measurements: Goal: Diagnostic test results will improve Outcome: Progressing Goal: Respiratory complications will improve Outcome: Progressing Goal: Cardiovascular complication will be avoided Outcome: Progressing   Problem: Activity: Goal: Risk for activity intolerance will decrease Outcome: Progressing   Problem: Nutrition: Goal: Adequate nutrition will be maintained Outcome: Progressing   Problem: Elimination: Goal: Will not experience complications related to bowel motility Outcome: Progressing Goal: Will not experience complications related to urinary retention Outcome: Progressing   Problem: Pain Managment: Goal: General experience of comfort will improve Outcome: Progressing   Problem: Safety: Goal: Ability to remain free from injury will improve Outcome: Progressing   Problem: Skin Integrity: Goal: Risk for impaired skin integrity will decrease Outcome: Progressing

## 2021-01-18 NOTE — Progress Notes (Signed)
Left upper extremity venous duplex completed. Refer to "CV Proc" under chart review to view preliminary results.  01/18/2021 4:25 PM Kelby Aline., MHA, RVT, RDCS, RDMS

## 2021-01-18 NOTE — Progress Notes (Signed)
Daily Progress Note   Patient Name: [name]       Date: 01/18/2021 DOB: 07-26-1945  Age: 75 y.o. MRN#: [id number] Attending Physician: Aline August, MD Primary Care Physician: Cassandria Anger, MD Admit Date: 01/15/2021  Reason for Consultation/Follow-up: Establishing goals of care  Subjective:  Off BIPAP, off pressors, on 1 L O2 [place], in no distress, feels better this morning, denies pain, denies dyspnea.    Length of Stay: 3  Current Medications: Scheduled Meds:   (feeding supplement) PROSource Plus  30 mL Oral BID BM   apixaban  5 mg Oral BID   chlorhexidine  15 mL Mouth Rinse BID   Chlorhexidine Gluconate Cloth  6 each Topical Daily   feeding supplement  237 mL Oral BID BM   FLUoxetine  40 mg Oral Daily   folic acid  1 mg Oral Daily   gabapentin  600 mg Oral QHS   Gerhardt's butt cream   Topical BID   ipratropium-albuterol  3 mL Nebulization TID   mouth rinse  15 mL Mouth Rinse q12n4p   mometasone-formoterol  2 puff Inhalation BID   multivitamin with minerals  1 tablet Oral Daily   nutrition supplement (JUVEN)  1 packet Oral BID BM   pantoprazole  40 mg Oral QPM   pyridOXINE  50 mg Oral Daily   tamsulosin  0.4 mg Oral Daily   vitamin B-12  1,000 mcg Oral Daily    Continuous Infusions:  sodium chloride 250 mL (01/18/21 1123)   vancomycin Stopped (01/17/21 1624)    PRN Meds: acetaminophen **OR** acetaminophen, albuterol, HYDROmorphone, polyvinyl alcohol, prochlorperazine  Physical Exam         Awake alert No distress Regular work of breathing S 1 S 2  Abdomen soft, not tender Has bilateral dressings both LE, some edema.  No focal deficits.   Vital Signs: BP 125/87   Pulse 87   Temp 98.1 F (36.7 C)   Resp 20   Ht 5' 6"  (1.676 m)   Wt 71.2  kg   SpO2 98%   BMI 25.34 kg/m  SpO2: SpO2: 98 % O2 Device: O2 Device: Nasal Cannula O2 Flow Rate: O2 Flow Rate (L/min): 1 L/min  Intake/output summary:  Intake/Output Summary (Last 24 hours) at 01/18/2021 1207 Last data filed at 01/18/2021 0900 Gross  per 24 hour  Intake 3361.67 ml  Output 925 ml  Net 2436.67 ml   LBM: Last BM Date: 01/15/21 Baseline Weight: Weight: 66.9 kg Most recent weight: Weight: 71.2 kg       Palliative Assessment/Data:      Patient Active Problem List   Diagnosis Date Noted   Palliative care by specialist    General weakness    Sepsis ([place]) 01/16/2021   Sepsis due to cellulitis ([place]) 01/15/2021   Prolonged QT interval 01/15/2021   Pulmonary nodules 01/15/2021   AKI (acute kidney injury) ([place]) 01/15/2021   Open thigh wound    Citrobacter infection    Pseudomonas infection    Pressure injury of skin 12/21/2020   Fall    Hyperbilirubinemia    Malignant melanoma ([place])    Cellulitis 12/20/2020   Blisters of multiple sites 11/05/2020   Goals of care, counseling/discussion 10/12/2020   Atherosclerosis of aorta ([place]) 07/24/2020   Symptomatic anemia 05/27/2020   Pulmonary embolus ([place]) 05/27/2020   Acute respiratory failure with hypoxia ([place]) 05/26/2020   Callus of foot 04/21/2018   Edema 03/27/2018   Toxic gastroenteritis and colitis 03/25/2018   Other ulcerative 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 ([place]) 70/96/2836   Diarrhea 12/23/2017   Nausea & vomiting 12/23/2017   Herpes zoster 07/07/2017   Constipation 04/21/2017   Genetic testing 03/06/2017   Family history of cancer    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[place]) 02/12/2017   Iron deficiency anemia due to chronic blood loss 12/27/2016   Scalp wound 06/13/2016   Scalp laceration, subsequent encounter 06/04/2016   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   Pain in joint, ankle and foot 09/28/2015   Anemia due to chronic blood loss 09/28/2015   Hyponatremia 09/28/2015   Cramp in limb  09/28/2015   Exertional angina ([place]) 06/17/2014   DOE (dyspnea on exertion) 04/28/2014   Hypokalemia 03/11/2014   Counseling regarding goals of care 12/31/2013   Impaired glucose tolerance 12/31/2013   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[place]) 11/18/2013   Fever 11/18/2013   Leukocytosis 10/30/2013   Fatigue 10/22/2013   Hoarseness of voice 08/10/2013   Sleep disorder 07/05/2013   Tension headache 07/01/2013   Polypharmacy 06/19/2013   Lumbar post-laminectomy syndrome 05/27/2013   Degenerative arthritis of spine 05/26/2013   Hemochromatosis 05/26/2013   Wound infection after surgery 05/26/2013   Normocytic anemia 05/26/2013   Unintentional weight loss 05/26/2013   Protein-calorie malnutrition, severe ([place]) 05/25/2013   Chronic pain syndrome 05/23/2013   COPD (chronic obstructive pulmonary disease) ([place]) 05/06/2013   Frequent falls 05/06/2013   Encounter for postoperative wound care 05/06/2013   Dehydration 03/17/2013   Atrial fibrillation ([place]) 08/07/2010   Rheumatoid arthritis ([place]) 10/18/2008   Anxiety 11/09/2007   Elevated lipids 03/30/2007   PSEUDOGOUT 03/30/2007   Essential hypertension 03/30/2007   Coronary atherosclerosis 03/30/2007    Palliative Care Assessment & Plan   Patient Profile:    Assessment:  75 y.o. male admitted on 01/15/2021   [name] is a 75 y.o. male with a PMH significant for CAD, CHF, COPD, maligant melanoma, paroxysmal A-fib on Eliquis, HLD, prior DVT and PE, RA, HTN, and ETOH use who presented to the ED after suffering a fall at [place] independent living.   Patient sees Dr Marin Olp for melanoma, found to be recurrent, based on PET scan in April 2022. Patient underwent excision of L thigh nodule, excision of L inguinal lymph node and with lymphedema post biopsy. Patient admitted with cellulitis of LLE recently. Remains  admitted to the hospital for LLE cellulitis, hospital course with acute hypoxic resp failure, currently off BIPAP.    Recommendations/Plan: Continue current mode of care Recommend SNF rehab with palliative on discharge, patient agreeable to re consider [facility] towards the end of this hospitalization, TOC assisting with disposition.  No further PMT specific recommendations, continue current mode of care, is DNR.   Goals of Care and Additional Recommendations: Limitations on Scope of Treatment: Full Scope Treatment  Code Status:    Code Status Orders  (From admission, onward)           Start     Ordered   01/15/21 1557  Do not attempt resuscitation (DNR)  Continuous       Question Answer Comment  In the event of cardiac or respiratory ARREST Do not call a "code blue"   In the event of cardiac or respiratory ARREST Do not perform Intubation, CPR, defibrillation or ACLS   In the event of cardiac or respiratory ARREST Use medication by any route, position, wound care, and other measures to relive pain and suffering. May use oxygen, suction and manual treatment of airway obstruction as needed for comfort.   Comments DNR confirmed with patient      01/15/21 1556           Code Status History     Date Active Date Inactive Code Status Order ID Comments User Context   12/20/2020 1800 12/27/2020 1732 DNR [id number]  Jonnie Finner, DO Inpatient   05/27/2020 0125 05/29/2020 1847 DNR [id number]  Lenore Cordia, MD Inpatient   02/12/2017 1815 02/13/2017 1604 Full Code [id number]  Stark Klein, MD Inpatient   06/17/2014 0948 06/18/2014 1334 Full Code [id number]  Sherren Mocha, MD Inpatient   10/30/2013 0052 11/01/2013 1341 Full Code [id number]  Shanda Howells, MD ED   06/16/2013 2154 06/19/2013 1600 Full Code [id number]  Merton Border, MD Inpatient   06/08/2013 1534 06/10/2013 1911 Full Code [id number]  Luanne Bras, MD Inpatient   05/27/2013 1642 06/08/2013 1534 Full Code [id number]  Cathlyn Parsons, PA-C Inpatient   05/27/2013 1642 05/27/2013 1642 Full Code [id number]  Cathlyn Parsons, PA-C Inpatient    05/23/2013 2239 05/27/2013 1642 Full Code [id number]  Jonetta Osgood, MD Inpatient   05/06/2013 1552 05/09/2013 1603 Full Code [id number]  Robbie Lis, MD ED   04/13/2013 1713 04/24/2013 2037 Full Code [id number]  Orvan Falconer, MD Inpatient   03/16/2013 1650 04/06/2013 1626 Full Code [id number]  Floyce Stakes, MD Inpatient   03/02/2013 1741 03/10/2013 2021 Full Code [id number]  Floyce Stakes, MD Inpatient      Advance Directive Documentation    Flowsheet Row Most Recent Value  Type of Advance Directive Healthcare Power of Attorney  Pre-existing out of facility DNR order (yellow form or pink MOST form) --  "MOST" Form in Place? --       Prognosis:  Unable to determine  Discharge Planning: [facility] for rehab with Palliative care service follow-up  Care plan was discussed with  patient.   Thank you for allowing the Palliative Medicine Team to assist in the care of this patient.   Time In: 11 Time Out: 11.25 Total Time 25 Prolonged Time Billed  no       Greater than 50%  of this time was spent counseling and coordinating care related to the above assessment and plan.  Loistine Chance, MD  Please contact Palliative Medicine Team phone at [phone] for questions and  concerns.

## 2021-01-18 NOTE — Evaluation (Signed)
Occupational Therapy Evaluation Patient Details Name: [name] MRN: [id number] DOB: 11/02/1945 Today's Date: 01/18/2021   History of Present Illness Patient 75 year old man history of malignant melanoma L thigh, paroxysmal A. fib, RA, COPD, HTN,hemochromatosis, chronic back pain, anemia, anxiety, falls, ETOH abuse presented  01/15/21 with sepsis, L LE cellulitis, dehydration. Required Bipap and pressors.   Clinical Impression   Pt walks with a RW and is assisted as needed with dressing by his wife. He lives in [place]. He typically sleeps in a lift chair. Pt presents with longstanding shoulder limitations and hand deformities due to RA. He is generally weak with decreased activity tolerance and impaired standing balance. Pt requires mod assist for OOB, min assist to ambulate with close chair follow. Sp02 noted to drop to mid 80s on 3L with activity, but rebounded quickly to 92% with seated rest break. Pt at 96% on 2L at rest. Pt needs set up to max assist for ADL. Recommending SNF for further rehab, but pt is hopeful to go home.      Recommendations for follow up therapy are one component of a multi-disciplinary discharge planning process, led by the attending physician.  Recommendations may be updated based on patient status, additional functional criteria and insurance authorization.   Follow Up Recommendations  SNF    Equipment Recommendations  None recommended by OT    Recommendations for Other Services       Precautions / Restrictions Precautions Precautions: Fall Precaution Comments: watch O2      Mobility Bed Mobility Overal bed mobility: Needs Assistance Bed Mobility: Supine to Sit     Supine to sit: Mod assist     General bed mobility comments: assist to raise trunk    Transfers Overall transfer level: Needs assistance Equipment used: Rolling walker (2 wheeled) Transfers: Sit to/from Stand Sit to Stand: Mod assist         General transfer  comment: assist to rise and steady    Balance Overall balance assessment: Needs assistance;History of Falls Sitting-balance support: Feet supported Sitting balance-Leahy Scale: Fair     Standing balance support: No upper extremity supported Standing balance-Leahy Scale: Poor Standing balance comment: reliant on RW                           ADL either performed or assessed with clinical judgement   ADL Overall ADL's : Needs assistance/impaired Eating/Feeding: Set up;Sitting   Grooming: Set up;Sitting   Upper Body Bathing: Minimal assistance;Sitting   Lower Body Bathing: Maximal assistance;Sit to/from stand   Upper Body Dressing : Moderate assistance;Sitting   Lower Body Dressing: Maximal assistance;Sit to/from stand   Toilet Transfer: Minimal assistance;Ambulation;RW   Toileting- Clothing Manipulation and Hygiene: Maximal assistance;Sit to/from stand       Functional mobility during ADLs: Minimal assistance;Rolling walker;+2 for safety/equipment       Vision Patient Visual Report: No change from baseline       Perception     Praxis      Pertinent Vitals/Pain Pain Assessment: Faces Faces Pain Scale: No hurt     Hand Dominance Right   Extremity/Trunk Assessment Upper Extremity Assessment Upper Extremity Assessment: RUE deficits/detail;LUE deficits/detail RUE Deficits / Details: longstanding limited shoulder ROM, arthritic changes in hand RUE Coordination: decreased gross motor;decreased fine motor LUE Deficits / Details: longstanding shoulder limitations, arthritic changes in hand LUE Coordination: decreased fine motor;decreased gross motor   Lower Extremity Assessment Lower Extremity Assessment: Defer to PT  evaluation   Cervical / Trunk Assessment Cervical / Trunk Assessment: Kyphotic   Communication Communication Communication: No difficulties   Cognition Arousal/Alertness: Awake/alert Behavior During Therapy: WFL for tasks  assessed/performed Overall Cognitive Status: Within Functional Limits for tasks assessed                                     General Comments       Exercises     Shoulder Instructions      Home Living Family/patient expects to be discharged to:: Private residence Living Arrangements: Spouse/significant other Available Help at Discharge: Available 24 hours/day Type of Home: Independent living facility Home Access: Level entry     Home Layout: One level     Bathroom Shower/Tub: Walk-in shower;Curtain   Bathroom Toilet: Handicapped height     Home Equipment: Environmental consultant - 2 wheels;Other (comment)   Additional Comments: lift chair      Prior Functioning/Environment Level of Independence: Needs assistance  Gait / Transfers Assistance Needed: using RW ADL's / Homemaking Assistance Needed: assisted as needed for dressing, depending on pain/mobility   Comments: Sleeps in a lift char        OT Problem List: Decreased strength;Decreased range of motion;Decreased activity tolerance;Impaired balance (sitting and/or standing);Decreased knowledge of use of DME or AE;Pain;Impaired UE functional use;Cardiopulmonary status limiting activity      OT Treatment/Interventions: Self-care/ADL training;Therapeutic exercise;DME and/or AE instruction;Therapeutic activities;Balance training;Patient/family education    OT Goals(Current goals can be found in the care plan section) Acute Rehab OT Goals Patient Stated Goal: return home OT Goal Formulation: With patient Time For Goal Achievement: 02/01/21 Potential to Achieve Goals: Good  OT Frequency: Min 2X/week   Barriers to D/C:            Co-evaluation PT/OT/SLP Co-Evaluation/Treatment: Yes Reason for Co-Treatment: Complexity of the patient's impairments (multi-system involvement);For patient/therapist safety   OT goals addressed during session: ADL's and self-care      AM-PAC OT "6 Clicks" Daily Activity      Outcome Measure Help from another person eating meals?: A Little Help from another person taking care of personal grooming?: A Little Help from another person toileting, which includes using toliet, bedpan, or urinal?: A Lot Help from another person bathing (including washing, rinsing, drying)?: A Lot Help from another person to put on and taking off regular upper body clothing?: A Lot Help from another person to put on and taking off regular lower body clothing?: A Lot 6 Click Score: 14   End of Session Equipment Utilized During Treatment: Rolling walker;Gait belt;Oxygen (3L) Nurse Communication: Mobility status  Activity Tolerance: Patient tolerated treatment well Patient left: in chair;with call bell/phone within reach;with chair alarm set  OT Visit Diagnosis: Unsteadiness on feet (R26.81);Pain;Muscle weakness (generalized) (M62.81)                Time: 5638-9373 OT Time Calculation (min): 31 min Charges:  OT General Charges $OT Visit: 1 Visit OT Evaluation $OT Eval Moderate Complexity: 1 Mod  Nestor Lewandowsky, OTR/L Acute Rehabilitation Services Pager: [phone] Office: [phone]   Malka So 01/18/2021, 10:20 AM

## 2021-01-19 ENCOUNTER — Other Ambulatory Visit: Payer: Self-pay | Admitting: Internal Medicine

## 2021-01-19 DIAGNOSIS — I2699 Other pulmonary embolism without acute cor pulmonale: Secondary | ICD-10-CM

## 2021-01-19 DIAGNOSIS — N179 Acute kidney failure, unspecified: Secondary | ICD-10-CM | POA: Diagnosis not present

## 2021-01-19 DIAGNOSIS — F419 Anxiety disorder, unspecified: Secondary | ICD-10-CM

## 2021-01-19 DIAGNOSIS — I480 Paroxysmal atrial fibrillation: Secondary | ICD-10-CM | POA: Diagnosis not present

## 2021-01-19 DIAGNOSIS — S71102D Unspecified open wound, left thigh, subsequent encounter: Secondary | ICD-10-CM

## 2021-01-19 DIAGNOSIS — L0390 Cellulitis, unspecified: Secondary | ICD-10-CM | POA: Diagnosis not present

## 2021-01-19 DIAGNOSIS — R918 Other nonspecific abnormal finding of lung field: Secondary | ICD-10-CM

## 2021-01-19 LAB — CBC WITH DIFFERENTIAL/PLATELET
Abs Immature Granulocytes: 0.05 10*3/uL (ref 0.00–0.07)
Basophils Absolute: 0 10*3/uL (ref 0.0–0.1)
Basophils Relative: 0 %
Eosinophils Absolute: 0 10*3/uL (ref 0.0–0.5)
Eosinophils Relative: 0 %
HCT: 29.3 % — ABNORMAL LOW (ref 39.0–52.0)
Hemoglobin: 8.5 g/dL — ABNORMAL LOW (ref 13.0–17.0)
Immature Granulocytes: 1 %
Lymphocytes Relative: 11 %
Lymphs Abs: 0.9 10*3/uL (ref 0.7–4.0)
MCH: 29.4 pg (ref 26.0–34.0)
MCHC: 29 g/dL — ABNORMAL LOW (ref 30.0–36.0)
MCV: 101.4 fL — ABNORMAL HIGH (ref 80.0–100.0)
Monocytes Absolute: 0.8 10*3/uL (ref 0.1–1.0)
Monocytes Relative: 10 %
Neutro Abs: 6.2 10*3/uL (ref 1.7–7.7)
Neutrophils Relative %: 78 %
Platelets: 181 10*3/uL (ref 150–400)
RBC: 2.89 MIL/uL — ABNORMAL LOW (ref 4.22–5.81)
RDW: 14.3 % (ref 11.5–15.5)
WBC: 7.9 10*3/uL (ref 4.0–10.5)
nRBC: 0 % (ref 0.0–0.2)

## 2021-01-19 LAB — IRON AND TIBC
Iron: 40 ug/dL — ABNORMAL LOW (ref 45–182)
Saturation Ratios: 33 % (ref 17.9–39.5)
TIBC: 121 ug/dL — ABNORMAL LOW (ref 250–450)
UIBC: 81 ug/dL

## 2021-01-19 LAB — BASIC METABOLIC PANEL
Anion gap: 6 (ref 5–15)
BUN: 29 mg/dL — ABNORMAL HIGH (ref 8–23)
CO2: 30 mmol/L (ref 22–32)
Calcium: 8.1 mg/dL — ABNORMAL LOW (ref 8.9–10.3)
Chloride: 102 mmol/L (ref 98–111)
Creatinine, Ser: 0.62 mg/dL (ref 0.61–1.24)
GFR, Estimated: >60 mL/min (ref >60)
Glucose, Bld: 98 mg/dL (ref 70–99)
Potassium: 4 mmol/L (ref 3.5–5.1)
Sodium: 138 mmol/L (ref 135–145)

## 2021-01-19 LAB — MAGNESIUM: Magnesium: 2.3 mg/dL (ref 1.7–2.4)

## 2021-01-19 MED ORDER — SODIUM CHLORIDE 0.9% FLUSH
10.0000 mL | INTRAVENOUS | Status: DC | PRN
  Administered 2021-01-23 – 2021-01-24 (×2): 10 mL

## 2021-01-19 MED ORDER — LINEZOLID 600 MG PO TABS
600.0000 mg | ORAL_TABLET | Freq: Two times a day (BID) | ORAL | Status: DC
  Administered 2021-01-20 – 2021-01-24 (×9): 600 mg via ORAL
  Filled 2021-01-19 (×10): qty 1

## 2021-01-19 MED ORDER — ALBUMIN HUMAN 25 % IV SOLN
25.0000 g | Freq: Once | INTRAVENOUS | Status: AC
  Administered 2021-01-19: 12.5 g via INTRAVENOUS
  Filled 2021-01-19: qty 100

## 2021-01-19 MED ORDER — TRAZODONE HCL 50 MG PO TABS
50.0000 mg | ORAL_TABLET | ORAL | Status: AC | PRN
  Administered 2021-01-19: 50 mg via ORAL
  Filled 2021-01-19: qty 1

## 2021-01-19 MED ORDER — MELATONIN 3 MG PO TABS
3.0000 mg | ORAL_TABLET | Freq: Once | ORAL | Status: AC
  Administered 2021-01-19: 3 mg via ORAL
  Filled 2021-01-19: qty 1

## 2021-01-19 NOTE — Progress Notes (Signed)
PROGRESS NOTE    [name]  ZSW:[id number] DOB: Jul 19, 1945 DOA: 01/15/2021 PCP: Cassandria Anger, MD   No chief complaint on file.   Brief Narrative:  75 y.o. male with medical history significant of alcohol abuse, allergic rhinitis, anxiety, CAD, chronic diastolic heart failure, chronic back pain, COPD, depression, diverticulosis, history of DVT and PE, history of single episode of A. fib, hemochromatosis, GERD, frequent falls, hyperlipidemia, chronic blood loss anemia, narcotic abuse history, history of neuropathy, osteoarthritis, rheumatoid arthritis presented from SNF weakness and multiple wounds on lower extremity.  On presentation, temperature was 103.2 with tachypnea.  White count was 15.5, lactic acid was 2.6 and then 2.3, creatinine of 2.02.  He was started on broad-spectrum antibiotics.  He subsequently required BiPAP and also had to be briefly started on pressors; he was transferred to ICU and PCCM took over his care on 01/17/2021.  He is off pressors currently and care has been transferred back to [facility] on 01/18/2021.     Assessment & Plan:   Principal Problem:   Sepsis due to cellulitis [facility]) Active Problems:   Anxiety   Essential hypertension   Coronary atherosclerosis   Atrial fibrillation (HCC)   COPD (chronic obstructive pulmonary disease) (HCC)   Lumbar post-laminectomy syndrome   Pulmonary embolus (HCC)   Open thigh wound   Prolonged QT interval   Pulmonary nodules   AKI (acute kidney injury) ([place])   Sepsis ([place])   Palliative care by specialist   General weakness  1 septic shock likely secondary lower extremity cellulitis -Patient presented with criteria for sepsis with fever, tachypnea, elevated lactic acid level, leukocytosis and evidence of lower extremity cellulitis. -Cellulitis noted to be more evident on the right lower extremity. -Patient was initially on IV vancomycin and Azactam. -Azactam discontinued. -COVID-19 PCR negative. -Patient  initially was transferred to the ICU briefly and placed on pressors. -Care taken over by PCCM on 01/17/2021, was transitioned off pressors and patient transferred back to [facility] service on 01/18/2021. -Hypotension improved. -Blood cultures with no growth to date. -Urine culture with multiple species. -Was on IV fluids which have subsequently been discontinued. -Discontinue IV vancomycin after today's dose and transition to Zyvox for 5 more days. -Supportive care.  2.  Acute hypoxemic respiratory failure/COPD -Patient noted to have a worsening respiratory status after hospitalization requiring BiPAP and felt respiratory status had worsened after oral Dilaudid use. -Patient seen by PCCM. -Currently off BiPAP. -On 1 L nasal cannula with sats of 93 to 95%.  3.  Acute metabolic encephalopathy -Felt likely multifactorial from acute sepsis and narcotic use. -Patient given 1 dose of Narcan on 01/16/2021 with improvement with mental status. -Oral Dilaudid resumed on 01/17/2021 per PCCM. -Patient was placed on a decreased dose of Dilaudid however it is noted per Dr.Alekh that patient was adamant he wanted to continue current dose as he been on it for 6-7 years. -Follow.  4.  AKI -Resolved with hydration.  5.  Leukocytosis -Resolved.  6.  Microcytic anemia -Continue vitamin B12 supplementation, folic acid.  7.  Hypertension -BP soft. -Continue to hold antihypertensive medications.  8.  Paroxysmal atrial fibrillation -Metoprolol on hold and likely resume once blood pressure improves. -Continue Eliquis for anticoagulation.  9.  History of PE -Eliquis.  10.  Lower extremity wounds, POA -Continue dressing changes as recommended by wound care RN.  11.?  Pulmonary nodules on x-ray -Outpatient follow-up with PCP/pulmonary.  12.  Anxiety -Continue fluoxetine. -Alprazolam on hold.  13.  Generalized deconditioning -  PT/OT.  14.  Recurrent melanoma/chronic pain -Being followed by  oncology. -Continue current pain management. -Per oncology.   DVT prophylaxis: Eliquis Code Status: DNR Family Communication: Updated patient.  No family at bedside. Disposition:   Status is: Inpatient  Remains inpatient appropriate because: Inpatient level of care appropriate due to severity of illness         Consultants:  PCCM Oncology Palliative care  Procedures:  Chest x-ray 01/15/2021, 01/16/2021 Upper extremity Dopplers 01/18/2021  Antimicrobials:  IV aztreonam 01/15/2021>>> 01/18/2021 IV Flagyl 1010 2022x1 dose IV vancomycin 01/15/2021>>>>> 01/19/2021 Oral Zyvox 01/20/2021>>>>> 01/25/2021     Subjective: Sitting up in bed.  Denies any chest pain.  No shortness of breath.  States some redness on right lower extremity.  Tolerating current diet.  Objective: Vitals:   01/19/21 0433 01/19/21 0747 01/19/21 0749 01/19/21 0800  BP: (!) 109/59     Pulse: 79     Resp: 19   (!) 27  Temp: 98.3 F (36.8 C)     TempSrc: Oral     SpO2: 94% 95% 95%   Weight:      Height:        Intake/Output Summary (Last 24 hours) at 01/19/2021 1335 Last data filed at 01/19/2021 1100 Gross per 24 hour  Intake 780 ml  Output 1300 ml  Net -520 ml   Filed Weights   01/17/21 0500 01/18/21 0458 01/18/21 1228  Weight: 67.9 kg 71.2 kg 72.8 kg    Examination:  General exam: Appears calm and comfortable  Respiratory system: Clear to auscultation. Respiratory effort normal. Cardiovascular system: S1 & S2 heard, RRR. No JVD, murmurs, rubs, gallops or clicks. No pedal edema. Gastrointestinal system: Abdomen is nondistended, soft and nontender. No organomegaly or masses felt. Normal bowel sounds heard. Central nervous system: Alert and oriented. No focal neurological deficits. Extremities: Trace to 1+ mild lower extremity edema bilaterally.  Symmetric 5 x 5 power. Skin: Bilateral lower extremity chronic skin changes with erythema and tenderness with wounds in the left thigh with  dressing and right lower extremity skin tear.  No significant ecchymosis. Psychiatry: Judgement and insight appear normal. Mood & affect appropriate.     Data Reviewed: I have personally reviewed following labs and imaging studies  CBC: Recent Labs  Lab 01/15/21 1057 01/15/21 1231 01/16/21 0612 01/17/21 0219 01/18/21 0345 01/19/21 0336  WBC 15.5* 16.3* 14.7* 6.5 9.2 7.9  NEUTROABS 13.5* 14.0* 13.1* 6.0  --  6.2  HGB 10.2* 10.9* 10.0* 8.9* 8.2* 8.5*  HCT 33.9* 35.5* 33.7* 29.5* 27.5* 29.3*  MCV 98.5 98.1 102.4* 99.3 100.0 101.4*  PLT 194 189 169 155 179 086    Basic Metabolic Panel: Recent Labs  Lab 01/15/21 1057 01/16/21 0612 01/17/21 0219 01/18/21 0345 01/19/21 0336  NA 138  --  142 142 138  K 3.6  --  3.4* 3.6 4.0  CL 94*  --  105 109 102  CO2 37*  --  29 29 30   GLUCOSE 119*  --  174* 196* 98  BUN 44*  --  34* 27* 29*  CREATININE 2.02* 1.23 0.86 0.61 0.62  CALCIUM 7.4*  --  7.6* 8.1* 8.1*  MG  --   --  2.3 2.4 2.3  PHOS  --   --   --  1.9*  --     GFR: Estimated Creatinine Clearance: 74.2 mL/min (by C-G formula based on SCr of 0.62 mg/dL).  Liver Function Tests: Recent Labs  Lab 01/15/21 1057 01/17/21 0219  AST 36 21  ALT 16 16  ALKPHOS 46 40  BILITOT 0.9 1.2  PROT 5.5* 4.7*  ALBUMIN 3.0* 2.4*    CBG: No results for input(s): GLUCAP in the last 168 hours.   Recent Results (from the past 240 hour(s))  Culture, blood (Routine x 2)     Status: None (Preliminary result)   Collection Time: 01/15/21 10:57 AM   Specimen: BLOOD  Result Value Ref Range Status   Specimen Description   Final    BLOOD LEFT ANTECUBITAL Performed at [facility] [address] [place], [place] [address]    Special Requests   Final    BOTTLES DRAWN AEROBIC AND ANAEROBIC Blood Culture adequate volume Performed at [facility] [address] [place], [place] [address]    Culture   Final    NO GROWTH 4 DAYS Performed at [facility], [place] [address], [place], [place] [address]    Report Status PENDING  Incomplete  Culture, blood (Routine x 2)     Status: None (Preliminary result)   Collection Time: 01/15/21 12:31 PM   Specimen: BLOOD  Result Value Ref Range Status   Specimen Description   Final    BLOOD SITE NOT SPECIFIED Performed at [facility] [address], [place], [place] [address]    Special Requests   Final    BOTTLES DRAWN AEROBIC AND ANAEROBIC Blood Culture adequate volume Performed at [facility] [address] [place], [place] [address]    Culture   Final    NO GROWTH 4 DAYS Performed at [facility], [place] [address], [place], [place] [address]    Report Status PENDING  Incomplete  Resp Panel by RT-PCR (Flu A&B, Covid)     Status: None   Collection Time: 01/15/21 12:31 PM   Specimen: Nasopharyngeal(NP) swabs in vial transport medium  Result Value Ref Range Status   SARS Coronavirus 2 by RT PCR NEGATIVE NEGATIVE Final    Comment: (NOTE) SARS-CoV-2 target nucleic acids are NOT DETECTED.  The SARS-CoV-2 RNA is generally detectable in upper respiratory specimens during the acute phase of infection. The lowest concentration of SARS-CoV-2 viral copies this assay can detect is 138 copies/mL. A negative result does not preclude SARS-Cov-2 infection and should not be used as the sole basis for treatment or other patient management decisions. A negative result may occur with  improper specimen collection/handling, submission of specimen other than nasopharyngeal swab, presence of viral mutation(s) within the areas targeted by this assay, and inadequate number of viral copies(<138 copies/mL). A negative result must be combined with clinical observations, patient history, and epidemiological information. The expected result is Negative.  Fact Sheet for Patients:  EntrepreneurPulse.com.au  Fact Sheet for Healthcare Providers:   IncredibleEmployment.be  This test is no t yet approved or cleared by the [place] FDA and  has been authorized for detection and/or diagnosis of SARS-CoV-2 by FDA under an Emergency Use Authorization (EUA). This EUA will remain  in effect (meaning this test can be used) for the duration of the COVID-19 declaration under Section 564(b)(1) of the Act, 21 U.S.C.section 360bbb-3(b)(1), unless the authorization is terminated  or revoked sooner.       Influenza A by PCR NEGATIVE NEGATIVE Final   Influenza B by PCR NEGATIVE NEGATIVE Final    Comment: (NOTE) The Xpert Xpress SARS-CoV-2/FLU/RSV plus assay is intended as an aid in the diagnosis of influenza from Nasopharyngeal swab  specimens and should not be used as a sole basis for treatment. Nasal washings and aspirates are unacceptable for Xpert Xpress SARS-CoV-2/FLU/RSV testing.  Fact Sheet for Patients: EntrepreneurPulse.com.au  Fact Sheet for Healthcare Providers: IncredibleEmployment.be  This test is not yet approved or cleared by the [place] FDA and has been authorized for detection and/or diagnosis of SARS-CoV-2 by FDA under an Emergency Use Authorization (EUA). This EUA will remain in effect (meaning this test can be used) for the duration of the COVID-19 declaration under Section 564(b)(1) of the Act, 21 U.S.C. section 360bbb-3(b)(1), unless the authorization is terminated or revoked.  Performed at [facility], [place] [address], [place], [place] [address]   Urine Culture     Status: Abnormal   Collection Time: 01/15/21 11:00 PM   Specimen: In/Out Cath Urine  Result Value Ref Range Status   Specimen Description   Final    IN/OUT CATH URINE Performed at [facility] [address] [place], [place] [address]    Special Requests   Final    NONE Performed at [facility], [place] [address],  [place], [place] [address]    Culture MULTIPLE SPECIES PRESENT, SUGGEST RECOLLECTION (A)  Final   Report Status 01/16/2021 FINAL  Final  MRSA Next Gen by PCR, Nasal     Status: None   Collection Time: 01/16/21  6:05 PM   Specimen: Nasal Mucosa; Nasal Swab  Result Value Ref Range Status   MRSA by PCR Next Gen NOT DETECTED NOT DETECTED Final    Comment: (NOTE) The GeneXpert MRSA Assay (FDA approved for NASAL specimens only), is one component of a comprehensive MRSA colonization surveillance program. It is not intended to diagnose MRSA infection nor to guide or monitor treatment for MRSA infections. Test performance is not FDA approved in patients less than 40 years old. Performed at [facility], [place] [address], [place], [place] [address]          Radiology Studies: VAS [place] UPPER EXTREMITY VENOUS DUPLEX  Result Date: 01/19/2021 UPPER VENOUS STUDY  Patient Name:  ALEXANDAR WEISENBERGER  Date of Exam:   01/18/2021 Medical Rec #: [id number]           Accession #:    [id number] Date of Birth: 08-07-1945           Patient Gender: M Patient Age:   47 years Exam Location:  [facility] Procedure:      VAS [place] UPPER EXTREMITY VENOUS DUPLEX Referring Phys: Aline August --------------------------------------------------------------------------------  Indications: Edema Limitations: Body habitus, poor ultrasound/tissue interface and restricted mobility. Comparison Study: No prior study Performing Technologist: Maudry Mayhew MHA, RDMS, RVT, RDCS  Examination Guidelines: A complete evaluation includes B-mode imaging, spectral Doppler, color Doppler, and power Doppler as needed of all accessible portions of each vessel. Bilateral testing is considered an integral part of a complete examination. Limited examinations for reoccurring indications may be performed as noted.  Right Findings: +----------+------------+---------+-----------+----------+--------------+ RIGHT      CompressiblePhasicitySpontaneousProperties   Summary     +----------+------------+---------+-----------+----------+--------------+ Subclavian                                          Not visualized +----------+------------+---------+-----------+----------+--------------+  Left Findings: +----------+------------+---------+-----------+----------+-------+ LEFT      CompressiblePhasicitySpontaneousPropertiesSummary +----------+------------+---------+-----------+----------+-------+ IJV           Full  Yes       Yes                      +----------+------------+---------+-----------+----------+-------+ Subclavian    Full       Yes       Yes                      +----------+------------+---------+-----------+----------+-------+ Axillary      Full       Yes       Yes                      +----------+------------+---------+-----------+----------+-------+ Brachial      Full       Yes       Yes                      +----------+------------+---------+-----------+----------+-------+ Radial        Full                                          +----------+------------+---------+-----------+----------+-------+ Ulnar         Full                                          +----------+------------+---------+-----------+----------+-------+ Cephalic      Full                                          +----------+------------+---------+-----------+----------+-------+ Basilic       Full                                          +----------+------------+---------+-----------+----------+-------+  Summary:  Left: No evidence of deep vein thrombosis in the upper extremity. No evidence of superficial vein thrombosis in the upper extremity.  *See table(s) above for measurements and observations.  Diagnosing physician: Orlie Pollen Electronically signed by Orlie Pollen on 01/19/2021 at 8:18:06 AM.    Final         Scheduled Meds:  (feeding supplement)  PROSource Plus  30 mL Oral BID BM   apixaban  5 mg Oral BID   chlorhexidine  15 mL Mouth Rinse BID   Chlorhexidine Gluconate Cloth  6 each Topical Daily   feeding supplement  237 mL Oral BID BM   FLUoxetine  40 mg Oral Daily   folic acid  1 mg Oral Daily   gabapentin  600 mg Oral QHS   Gerhardt's butt cream   Topical BID   ipratropium-albuterol  3 mL Nebulization TID   [START ON 01/20/2021] linezolid  600 mg Oral Q12H   mouth rinse  15 mL Mouth Rinse q12n4p   metoprolol tartrate  25 mg Oral BID   mometasone-formoterol  2 puff Inhalation BID   multivitamin with minerals  1 tablet Oral Daily   nutrition supplement (JUVEN)  1 packet Oral BID BM   pantoprazole  40 mg Oral QPM   pyridOXINE  50 mg Oral Daily   senna-docusate  1 tablet Oral BID   tamsulosin  0.4 mg  Oral Daily   vitamin B-12  1,000 mcg Oral Daily   Continuous Infusions:  sodium chloride 250 mL (01/18/21 1123)   vancomycin 1,500 mg (01/18/21 1443)     LOS: 4 days    Time spent: 35 minutes    Irine Seal, MD [facility]   To contact the attending provider between 7A-7P or the covering provider during after hours 7P-7A, please log into the web site [email or web address] and access using universal [place] password for that web site. If you do not have the password, please call the hospital operator.  01/19/2021, 1:35 PM

## 2021-01-19 NOTE — Plan of Care (Signed)
  Problem: Health Behavior/Discharge Planning: Goal: Ability to manage health-related needs will improve Outcome: Progressing   Problem: Activity: Goal: Risk for activity intolerance will decrease Outcome: Progressing   Problem: Skin Integrity: Goal: Risk for impaired skin integrity will decrease Outcome: Progressing

## 2021-01-19 NOTE — Plan of Care (Signed)
  Problem: Health Behavior/Discharge Planning: Goal: Ability to manage health-related needs will improve Outcome: Progressing   Problem: Clinical Measurements: Goal: Diagnostic test results will improve Outcome: Progressing Goal: Respiratory complications will improve Outcome: Progressing Goal: Cardiovascular complication will be avoided Outcome: Progressing   Problem: Activity: Goal: Risk for activity intolerance will decrease Outcome: Progressing   Problem: Nutrition: Goal: Adequate nutrition will be maintained Outcome: Progressing   Problem: Elimination: Goal: Will not experience complications related to bowel motility Outcome: Progressing Goal: Will not experience complications related to urinary retention Outcome: Progressing   Problem: Pain Managment: Goal: General experience of comfort will improve Outcome: Progressing   Problem: Safety: Goal: Ability to remain free from injury will improve Outcome: Progressing   Problem: Skin Integrity: Goal: Risk for impaired skin integrity will decrease Outcome: Progressing

## 2021-01-19 NOTE — Care Management Important Message (Signed)
Important Message  Patient Details IM Letter placed in the Patients room. Name: [name] MRN: [id number] Date of Birth: [birth year]   Medicare Important Message Given:  Yes     Kerin Salen 01/19/2021, 12:49 PM

## 2021-01-20 DIAGNOSIS — L0390 Cellulitis, unspecified: Secondary | ICD-10-CM | POA: Diagnosis not present

## 2021-01-20 DIAGNOSIS — I480 Paroxysmal atrial fibrillation: Secondary | ICD-10-CM | POA: Diagnosis not present

## 2021-01-20 DIAGNOSIS — F419 Anxiety disorder, unspecified: Secondary | ICD-10-CM | POA: Diagnosis not present

## 2021-01-20 DIAGNOSIS — N179 Acute kidney failure, unspecified: Secondary | ICD-10-CM | POA: Diagnosis not present

## 2021-01-20 LAB — CBC WITH DIFFERENTIAL/PLATELET
Abs Immature Granulocytes: 0.03 10*3/uL (ref 0.00–0.07)
Basophils Absolute: 0 10*3/uL (ref 0.0–0.1)
Basophils Relative: 0 %
Eosinophils Absolute: 0 10*3/uL (ref 0.0–0.5)
Eosinophils Relative: 0 %
HCT: 26.8 % — ABNORMAL LOW (ref 39.0–52.0)
Hemoglobin: 8 g/dL — ABNORMAL LOW (ref 13.0–17.0)
Immature Granulocytes: 0 %
Lymphocytes Relative: 14 %
Lymphs Abs: 0.9 10*3/uL (ref 0.7–4.0)
MCH: 30 pg (ref 26.0–34.0)
MCHC: 29.9 g/dL — ABNORMAL LOW (ref 30.0–36.0)
MCV: 100.4 fL — ABNORMAL HIGH (ref 80.0–100.0)
Monocytes Absolute: 0.7 10*3/uL (ref 0.1–1.0)
Monocytes Relative: 10 %
Neutro Abs: 5.2 10*3/uL (ref 1.7–7.7)
Neutrophils Relative %: 76 %
Platelets: 210 10*3/uL (ref 150–400)
RBC: 2.67 MIL/uL — ABNORMAL LOW (ref 4.22–5.81)
RDW: 14.3 % (ref 11.5–15.5)
WBC: 6.8 10*3/uL (ref 4.0–10.5)
nRBC: 0 % (ref 0.0–0.2)

## 2021-01-20 LAB — BASIC METABOLIC PANEL
Anion gap: 3 — ABNORMAL LOW (ref 5–15)
BUN: 21 mg/dL (ref 8–23)
CO2: 33 mmol/L — ABNORMAL HIGH (ref 22–32)
Calcium: 7.7 mg/dL — ABNORMAL LOW (ref 8.9–10.3)
Chloride: 103 mmol/L (ref 98–111)
Creatinine, Ser: 0.51 mg/dL — ABNORMAL LOW (ref 0.61–1.24)
GFR, Estimated: >60 mL/min (ref >60)
Glucose, Bld: 87 mg/dL (ref 70–99)
Potassium: 3.5 mmol/L (ref 3.5–5.1)
Sodium: 139 mmol/L (ref 135–145)

## 2021-01-20 LAB — CULTURE, BLOOD (ROUTINE X 2)
Culture: NO GROWTH
Culture: NO GROWTH
Special Requests: ADEQUATE
Special Requests: ADEQUATE

## 2021-01-20 LAB — PHOSPHORUS: Phosphorus: 2.8 mg/dL (ref 2.5–4.6)

## 2021-01-20 LAB — ERYTHROPOIETIN: Erythropoietin: 72.6 m[IU]/mL — ABNORMAL HIGH (ref 2.6–18.5)

## 2021-01-20 MED ORDER — FUROSEMIDE 10 MG/ML IJ SOLN
20.0000 mg | Freq: Once | INTRAMUSCULAR | Status: AC
  Administered 2021-01-20: 20 mg via INTRAVENOUS
  Filled 2021-01-20: qty 2

## 2021-01-20 MED ORDER — ALBUMIN HUMAN 25 % IV SOLN: 25.0000 g | Freq: Once | INTRAVENOUS | Status: DC

## 2021-01-20 MED ORDER — ALBUMIN HUMAN 25 % IV SOLN
25.0000 g | Freq: Once | INTRAVENOUS | Status: AC
  Administered 2021-01-20: 25 g via INTRAVENOUS
  Filled 2021-01-20: qty 100

## 2021-01-20 MED ORDER — MELATONIN 3 MG PO TABS
3.0000 mg | ORAL_TABLET | Freq: Every day | ORAL | Status: DC
  Administered 2021-01-20 – 2021-01-23 (×4): 3 mg via ORAL
  Filled 2021-01-20 (×5): qty 1

## 2021-01-20 NOTE — Plan of Care (Signed)
  Problem: Health Behavior/Discharge Planning: Goal: Ability to manage health-related needs will improve Outcome: Progressing   Problem: Clinical Measurements: Goal: Diagnostic test results will improve Outcome: Progressing   Problem: Clinical Measurements: Goal: Respiratory complications will improve Outcome: Progressing

## 2021-01-20 NOTE — Progress Notes (Addendum)
PROGRESS NOTE    [name]  GBT:[id number] DOB: 22-Jun-1945 DOA: 01/15/2021 PCP: Cassandria Anger, MD   No chief complaint on file.   Brief Narrative:  75 y.o. male with medical history significant of alcohol abuse, allergic rhinitis, anxiety, CAD, chronic diastolic heart failure, chronic back pain, COPD, depression, diverticulosis, history of DVT and PE, history of single episode of A. fib, hemochromatosis, GERD, frequent falls, hyperlipidemia, chronic blood loss anemia, narcotic abuse history, history of neuropathy, osteoarthritis, rheumatoid arthritis presented from SNF weakness and multiple wounds on lower extremity.  On presentation, temperature was 103.2 with tachypnea.  White count was 15.5, lactic acid was 2.6 and then 2.3, creatinine of 2.02.  He was started on broad-spectrum antibiotics.  He subsequently required BiPAP and also had to be briefly started on pressors; he was transferred to ICU and PCCM took over his care on 01/17/2021.  He is off pressors currently and care has been transferred back to [facility] on 01/18/2021.     Assessment & Plan:   Principal Problem:   Sepsis due to cellulitis [facility]) Active Problems:   Anxiety   Essential hypertension   Coronary atherosclerosis   Atrial fibrillation (HCC)   COPD (chronic obstructive pulmonary disease) (HCC)   Lumbar post-laminectomy syndrome   Pulmonary embolus (HCC)   Open thigh wound   Prolonged QT interval   Pulmonary nodules   AKI (acute kidney injury) ([place])   Sepsis ([place])   Palliative care by specialist   General weakness  1 septic shock likely secondary lower extremity cellulitis -Patient presented with criteria for sepsis with fever, tachypnea, elevated lactic acid level, leukocytosis and evidence of lower extremity cellulitis. -Cellulitis noted to be more evident on the right lower extremity. -Patient was initially on IV vancomycin and Azactam. -Azactam discontinued. -COVID-19 PCR negative. -Patient  initially was transferred to the ICU briefly and placed on pressors. -Care taken over by PCCM on 01/17/2021, was transitioned off pressors and patient transferred back to [facility] service on 01/18/2021. -Hypotension improved. -Blood cultures with no growth to date. -Urine culture with multiple species. -Was on IV fluids which have subsequently been discontinued. -Discontinued IV vancomycin and patient started on Zyvox (01/20/2021) x 5 days.  -Supportive care.  2.  Acute hypoxemic respiratory failure/COPD -Patient noted to have a worsening respiratory status after hospitalization requiring BiPAP and felt respiratory status had worsened after oral Dilaudid use. -Patient seen by PCCM. -Currently off BiPAP. -Sats of 94% on 1 L nasal cannula.  3.  Acute metabolic encephalopathy -Felt likely multifactorial from acute sepsis and narcotic use. -Patient given 1 dose of Narcan on 01/16/2021 with improvement with mental status. -Oral Dilaudid resumed on 01/17/2021 per PCCM. -Patient was placed on a decreased dose of Dilaudid however it is noted per Dr.Alekh that patient was adamant he wanted to continue current dose as he been on it for 6-7 years. -Follow.  4.  AKI -Resolved with hydration.  5.  Leukocytosis -Resolved.  6.  Microcytic anemia -Continue vitamin B12 supplementation, folic acid.   7.  Hypertension -BP soft. -We will give a dose of IV albumin. -Home dose metoprolol resumed last night.  8.  Paroxysmal atrial fibrillation -Metoprolol initially held due to soft/low blood pressure and resumed last night.  -Continue Eliquis for anticoagulation.  9.  History of PE -Eliquis.  10.  Lower extremity wounds, POA -Continue dressing changes as recommended by wound care RN.  11.?  Pulmonary nodules on x-ray -Outpatient follow-up with PCP/pulmonary.  12.  Anxiety -Fluoxetine.   -  Alprazolam on hold.   13.  Generalized deconditioning -PT/OT.  14.  Recurrent melanoma/chronic  pain -Being followed by oncology. -Continue current pain management. -Per oncology.   DVT prophylaxis: Eliquis Code Status: DNR Family Communication: Updated patient.  No family at bedside. Disposition:   Status is: Inpatient  Remains inpatient appropriate because: Inpatient level of care appropriate due to severity of illness         Consultants:  PCCM Oncology Palliative care  Procedures:  Chest x-ray 01/15/2021, 01/16/2021 Upper extremity Dopplers 01/18/2021  Antimicrobials:  IV aztreonam 01/15/2021>>> 01/18/2021 IV Flagyl 1010 2022x1 dose IV vancomycin 01/15/2021>>>>> 01/19/2021 Oral Zyvox 01/20/2021>>>>> 01/25/2021     Subjective: Sitting up in chair watching television.  States he is feeling a little bit better.  No chest pain.  No shortness of breath.  No abdominal pain.  Tolerating current diet.  Objective: Vitals:   01/19/21 1937 01/19/21 2154 01/20/21 0511 01/20/21 0512  BP:  (!) 128/54 128/69   Pulse:  93 83   Resp:  18 (!) 7 18  Temp:  99 F (37.2 C) (!) 97.5 F (36.4 C)   TempSrc:  Oral Oral   SpO2: 97% 95% 94%   Weight:      Height:        Intake/Output Summary (Last 24 hours) at 01/20/2021 1314 Last data filed at 01/20/2021 0512 Gross per 24 hour  Intake 240 ml  Output 800 ml  Net -560 ml    Filed Weights   01/17/21 0500 01/18/21 0458 01/18/21 1228  Weight: 67.9 kg 71.2 kg 72.8 kg    Examination:  General exam: NAD. Respiratory system: Lungs clear to auscultation bilaterally.  No wheezes, no crackles, no rhonchi.  Normal respiratory effort.  Speaking in full sentences.   Cardiovascular system: Regular rate rhythm no murmurs rubs or gallops.  No JVD.  Trace to 1+ right lower extremity edema. Gastrointestinal system: Abdomen is soft, nontender, nondistended, positive bowel sounds.  No rebound.  No guarding. GU: Penile/scrotal swelling. Central nervous system: Alert and oriented. No focal neurological deficits. Extremities:  Trace to 1+ mild right lower extremity edema. Skin: Bilateral lower extremity chronic skin changes with erythema and tenderness with wounds in the left thigh with dressing and right lower extremity skin tear.  Right lower extremity with some erythema.  No significant ecchymosis. Psychiatry: Judgement and insight appear normal. Mood & affect appropriate.     Data Reviewed: I have personally reviewed following labs and imaging studies  CBC: Recent Labs  Lab 01/15/21 1231 01/16/21 0612 01/17/21 0219 01/18/21 0345 01/19/21 0336 01/20/21 0339  WBC 16.3* 14.7* 6.5 9.2 7.9 6.8  NEUTROABS 14.0* 13.1* 6.0  --  6.2 5.2  HGB 10.9* 10.0* 8.9* 8.2* 8.5* 8.0*  HCT 35.5* 33.7* 29.5* 27.5* 29.3* 26.8*  MCV 98.1 102.4* 99.3 100.0 101.4* 100.4*  PLT 189 169 155 179 181 210     Basic Metabolic Panel: Recent Labs  Lab 01/15/21 1057 01/16/21 0612 01/17/21 0219 01/18/21 0345 01/19/21 0336 01/20/21 0339  NA 138  --  142 142 138 139  K 3.6  --  3.4* 3.6 4.0 3.5  CL 94*  --  105 109 102 103  CO2 37*  --  29 29 30  33*  GLUCOSE 119*  --  174* 196* 98 87  BUN 44*  --  34* 27* 29* 21  CREATININE 2.02* 1.23 0.86 0.61 0.62 0.51*  CALCIUM 7.4*  --  7.6* 8.1* 8.1* 7.7*  MG  --   --  2.3 2.4 2.3  --   PHOS  --   --   --  1.9*  --  2.8     GFR: Estimated Creatinine Clearance: 74.2 mL/min (A) (by C-G formula based on SCr of 0.51 mg/dL (L)).  Liver Function Tests: Recent Labs  Lab 01/15/21 1057 01/17/21 0219  AST 36 21  ALT 16 16  ALKPHOS 46 40  BILITOT 0.9 1.2  PROT 5.5* 4.7*  ALBUMIN 3.0* 2.4*     CBG: No results for input(s): GLUCAP in the last 168 hours.   Recent Results (from the past 240 hour(s))  Culture, blood (Routine x 2)     Status: None   Collection Time: 01/15/21 10:57 AM   Specimen: BLOOD  Result Value Ref Range Status   Specimen Description   Final    BLOOD LEFT ANTECUBITAL Performed at [facility] [address] [place], [place] [address]     Special Requests   Final    BOTTLES DRAWN AEROBIC AND ANAEROBIC Blood Culture adequate volume Performed at [facility] [address], [place], [place] [address]    Culture   Final    NO GROWTH 5 DAYS Performed at [facility], [place] [address], [place], [place] [address]    Report Status 01/20/2021 FINAL  Final  Culture, blood (Routine x 2)     Status: None   Collection Time: 01/15/21 12:31 PM   Specimen: BLOOD  Result Value Ref Range Status   Specimen Description   Final    BLOOD SITE NOT SPECIFIED Performed at [facility] [address] [place], [place] [address]    Special Requests   Final    BOTTLES DRAWN AEROBIC AND ANAEROBIC Blood Culture adequate volume Performed at [facility] [address], [place], [place] [address]    Culture   Final    NO GROWTH 5 DAYS Performed at [facility], [place] [address] [place],  [address]    Report Status 01/20/2021 FINAL  Final  Resp Panel by RT-PCR (Flu A&B, Covid)     Status: None   Collection Time: 01/15/21 12:31 PM   Specimen: Nasopharyngeal(NP) swabs in vial transport medium  Result Value Ref Range Status   SARS Coronavirus 2 by RT PCR NEGATIVE NEGATIVE Final    Comment: (NOTE) SARS-CoV-2 target nucleic acids are NOT DETECTED.  The SARS-CoV-2 RNA is generally detectable in upper respiratory specimens during the acute phase of infection. The lowest concentration of SARS-CoV-2 viral copies this assay can detect is 138 copies/mL. A negative result does not preclude SARS-Cov-2 infection and should not be used as the sole basis for treatment or other patient management decisions. A negative result may occur with  improper specimen collection/handling, submission of specimen other than nasopharyngeal swab, presence of viral mutation(s) within the areas targeted by this assay, and inadequate number of viral copies(<138 copies/mL). A negative result must  be combined with clinical observations, patient history, and epidemiological information. The expected result is Negative.  Fact Sheet for Patients:  EntrepreneurPulse.com.au  Fact Sheet for Healthcare Providers:  IncredibleEmployment.be  This test is no t yet approved or cleared by the [place] FDA and  has been authorized for detection and/or diagnosis of SARS-CoV-2 by FDA under an Emergency Use Authorization (EUA). This EUA will remain  in effect (meaning this test can be used) for the duration of the COVID-19 declaration under Section 564(b)(1) of the Act, 21 U.S.C.section 360bbb-3(b)(1),  unless the authorization is terminated  or revoked sooner.       Influenza A by PCR NEGATIVE NEGATIVE Final   Influenza B by PCR NEGATIVE NEGATIVE Final    Comment: (NOTE) The Xpert Xpress SARS-CoV-2/FLU/RSV plus assay is intended as an aid in the diagnosis of influenza from Nasopharyngeal swab specimens and should not be used as a sole basis for treatment. Nasal washings and aspirates are unacceptable for Xpert Xpress SARS-CoV-2/FLU/RSV testing.  Fact Sheet for Patients: EntrepreneurPulse.com.au  Fact Sheet for Healthcare Providers: IncredibleEmployment.be  This test is not yet approved or cleared by the [place] FDA and has been authorized for detection and/or diagnosis of SARS-CoV-2 by FDA under an Emergency Use Authorization (EUA). This EUA will remain in effect (meaning this test can be used) for the duration of the COVID-19 declaration under Section 564(b)(1) of the Act, 21 U.S.C. section 360bbb-3(b)(1), unless the authorization is terminated or revoked.  Performed at [facility], [place] [address] [place], [place] [address]   Urine Culture     Status: Abnormal   Collection Time: 01/15/21 11:00 PM   Specimen: In/Out Cath Urine  Result Value Ref Range Status   Specimen  Description   Final    IN/OUT CATH URINE Performed at [facility] [address] [place], [place] [address]    Special Requests   Final    NONE Performed at [facility], [place] [address], [place], [place] [address]    Culture MULTIPLE SPECIES PRESENT, SUGGEST RECOLLECTION (A)  Final   Report Status 01/16/2021 FINAL  Final  MRSA Next Gen by PCR, Nasal     Status: None   Collection Time: 01/16/21  6:05 PM   Specimen: Nasal Mucosa; Nasal Swab  Result Value Ref Range Status   MRSA by PCR Next Gen NOT DETECTED NOT DETECTED Final    Comment: (NOTE) The GeneXpert MRSA Assay (FDA approved for NASAL specimens only), is one component of a comprehensive MRSA colonization surveillance program. It is not intended to diagnose MRSA infection nor to guide or monitor treatment for MRSA infections. Test performance is not FDA approved in patients less than 75 years old. Performed at [facility], [place] [address], [place], [place] [address]           Radiology Studies: VAS [place] UPPER EXTREMITY VENOUS DUPLEX  Result Date: 01/19/2021 UPPER VENOUS STUDY  Patient Name:  JARRAD MCLEES  Date of Exam:   01/18/2021 Medical Rec #: [id number]           Accession #:    [id number] Date of Birth: 03-Mar-1946           Patient Gender: M Patient Age:   71 years Exam Location:  [facility] Procedure:      VAS [place] UPPER EXTREMITY VENOUS DUPLEX Referring Phys: Aline August --------------------------------------------------------------------------------  Indications: Edema Limitations: Body habitus, poor ultrasound/tissue interface and restricted mobility. Comparison Study: No prior study Performing Technologist: Maudry Mayhew MHA, RDMS, RVT, RDCS  Examination Guidelines: A complete evaluation includes B-mode imaging, spectral Doppler, color Doppler, and power Doppler as needed of all accessible portions of each vessel. Bilateral testing is  considered an integral part of a complete examination. Limited examinations for reoccurring indications may be performed as noted.  Right Findings: +----------+------------+---------+-----------+----------+--------------+ RIGHT     CompressiblePhasicitySpontaneousProperties   Summary     +----------+------------+---------+-----------+----------+--------------+ Subclavian  Not visualized +----------+------------+---------+-----------+----------+--------------+  Left Findings: +----------+------------+---------+-----------+----------+-------+ LEFT      CompressiblePhasicitySpontaneousPropertiesSummary +----------+------------+---------+-----------+----------+-------+ IJV           Full       Yes       Yes                      +----------+------------+---------+-----------+----------+-------+ Subclavian    Full       Yes       Yes                      +----------+------------+---------+-----------+----------+-------+ Axillary      Full       Yes       Yes                      +----------+------------+---------+-----------+----------+-------+ Brachial      Full       Yes       Yes                      +----------+------------+---------+-----------+----------+-------+ Radial        Full                                          +----------+------------+---------+-----------+----------+-------+ Ulnar         Full                                          +----------+------------+---------+-----------+----------+-------+ Cephalic      Full                                          +----------+------------+---------+-----------+----------+-------+ Basilic       Full                                          +----------+------------+---------+-----------+----------+-------+  Summary:  Left: No evidence of deep vein thrombosis in the upper extremity. No evidence of superficial vein thrombosis in the upper  extremity.  *See table(s) above for measurements and observations.  Diagnosing physician: Orlie Pollen Electronically signed by Orlie Pollen on 01/19/2021 at 8:18:06 AM.    Final         Scheduled Meds:  (feeding supplement) PROSource Plus  30 mL Oral BID BM   apixaban  5 mg Oral BID   chlorhexidine  15 mL Mouth Rinse BID   Chlorhexidine Gluconate Cloth  6 each Topical Daily   feeding supplement  237 mL Oral BID BM   FLUoxetine  40 mg Oral Daily   folic acid  1 mg Oral Daily   gabapentin  600 mg Oral QHS   Gerhardt's butt cream   Topical BID   ipratropium-albuterol  3 mL Nebulization TID   linezolid  600 mg Oral Q12H   mouth rinse  15 mL Mouth Rinse q12n4p   melatonin  3 mg Oral QHS   metoprolol tartrate  25 mg Oral BID   mometasone-formoterol  2 puff Inhalation BID   multivitamin with minerals  1 tablet Oral Daily   nutrition  supplement (JUVEN)  1 packet Oral BID BM   pantoprazole  40 mg Oral QPM   pyridOXINE  50 mg Oral Daily   senna-docusate  1 tablet Oral BID   tamsulosin  0.4 mg Oral Daily   vitamin B-12  1,000 mcg Oral Daily   Continuous Infusions:  sodium chloride 250 mL (01/18/21 1123)     LOS: 5 days    Time spent: 35 minutes    Irine Seal, MD [facility]   To contact the attending provider between 7A-7P or the covering provider during after hours 7P-7A, please log into the web site [email or web address] and access using universal [place] password for that web site. If you do not have the password, please call the hospital operator.  01/20/2021, 1:14 PM

## 2021-01-21 DIAGNOSIS — L0390 Cellulitis, unspecified: Secondary | ICD-10-CM | POA: Diagnosis not present

## 2021-01-21 DIAGNOSIS — Z515 Encounter for palliative care: Secondary | ICD-10-CM | POA: Diagnosis not present

## 2021-01-21 DIAGNOSIS — Z7189 Other specified counseling: Secondary | ICD-10-CM | POA: Diagnosis not present

## 2021-01-21 DIAGNOSIS — N179 Acute kidney failure, unspecified: Secondary | ICD-10-CM | POA: Diagnosis not present

## 2021-01-21 DIAGNOSIS — A419 Sepsis, unspecified organism: Secondary | ICD-10-CM | POA: Diagnosis not present

## 2021-01-21 DIAGNOSIS — F419 Anxiety disorder, unspecified: Secondary | ICD-10-CM | POA: Diagnosis not present

## 2021-01-21 DIAGNOSIS — I480 Paroxysmal atrial fibrillation: Secondary | ICD-10-CM | POA: Diagnosis not present

## 2021-01-21 LAB — CBC WITH DIFFERENTIAL/PLATELET
Abs Immature Granulocytes: 0.04 10*3/uL (ref 0.00–0.07)
Basophils Absolute: 0 10*3/uL (ref 0.0–0.1)
Basophils Relative: 0 %
Eosinophils Absolute: 0 10*3/uL (ref 0.0–0.5)
Eosinophils Relative: 0 %
HCT: 26.4 % — ABNORMAL LOW (ref 39.0–52.0)
Hemoglobin: 8.2 g/dL — ABNORMAL LOW (ref 13.0–17.0)
Immature Granulocytes: 1 %
Lymphocytes Relative: 14 %
Lymphs Abs: 0.9 10*3/uL (ref 0.7–4.0)
MCH: 30.5 pg (ref 26.0–34.0)
MCHC: 31.1 g/dL (ref 30.0–36.0)
MCV: 98.1 fL (ref 80.0–100.0)
Monocytes Absolute: 0.7 10*3/uL (ref 0.1–1.0)
Monocytes Relative: 10 %
Neutro Abs: 5 10*3/uL (ref 1.7–7.7)
Neutrophils Relative %: 75 %
Platelets: 216 10*3/uL (ref 150–400)
RBC: 2.69 MIL/uL — ABNORMAL LOW (ref 4.22–5.81)
RDW: 14.2 % (ref 11.5–15.5)
WBC: 6.7 10*3/uL (ref 4.0–10.5)
nRBC: 0 % (ref 0.0–0.2)

## 2021-01-21 LAB — BASIC METABOLIC PANEL
Anion gap: 4 — ABNORMAL LOW (ref 5–15)
BUN: 18 mg/dL (ref 8–23)
CO2: 34 mmol/L — ABNORMAL HIGH (ref 22–32)
Calcium: 7.9 mg/dL — ABNORMAL LOW (ref 8.9–10.3)
Chloride: 99 mmol/L (ref 98–111)
Creatinine, Ser: 0.48 mg/dL — ABNORMAL LOW (ref 0.61–1.24)
GFR, Estimated: >60 mL/min (ref >60)
Glucose, Bld: 101 mg/dL — ABNORMAL HIGH (ref 70–99)
Potassium: 3.2 mmol/L — ABNORMAL LOW (ref 3.5–5.1)
Sodium: 137 mmol/L (ref 135–145)

## 2021-01-21 MED ORDER — ALUM & MAG HYDROXIDE-SIMETH 200-200-20 MG/5ML PO SUSP: 30.0000 mL | Freq: Four times a day (QID) | ORAL | Status: DC | PRN

## 2021-01-21 MED ORDER — ONDANSETRON HCL 4 MG/2ML IJ SOLN
4.0000 mg | Freq: Once | INTRAMUSCULAR | Status: AC
  Administered 2021-01-21: 4 mg via INTRAVENOUS
  Filled 2021-01-21: qty 2

## 2021-01-21 MED ORDER — ONDANSETRON HCL 4 MG/2ML IJ SOLN
4.0000 mg | Freq: Four times a day (QID) | INTRAMUSCULAR | Status: DC | PRN
  Administered 2021-01-21 – 2021-01-23 (×3): 4 mg via INTRAVENOUS
  Filled 2021-01-21 (×2): qty 2

## 2021-01-21 MED ORDER — FLUOXETINE HCL 20 MG PO CAPS: 40.0000 mg | ORAL_CAPSULE | Freq: Every day | ORAL | Status: DC

## 2021-01-21 MED ORDER — POTASSIUM CHLORIDE CRYS ER 10 MEQ PO TBCR
40.0000 meq | EXTENDED_RELEASE_TABLET | ORAL | Status: AC
  Administered 2021-01-21 (×2): 40 meq via ORAL
  Filled 2021-01-21 (×2): qty 4

## 2021-01-21 NOTE — Progress Notes (Signed)
Daily Progress Note   Patient Name: [name]       Date: 01/21/2021 DOB: 1946-01-12  Age: 75 y.o. MRN#: [id number] Attending Physician: Eugenie Filler, MD Primary Care Physician: Cassandria Anger, MD Admit Date: 01/15/2021  Reason for Consultation/Follow-up: Establishing goals of care  Subjective: Patient complains of feeling nauseous this morning, has been given as needed medications for nausea, complains of burning/irritation in his Foley catheter.  He does not like heart healthy diet and requests for regular diet.   Length of Stay: 6  Current Medications: Scheduled Meds:   (feeding supplement) PROSource Plus  30 mL Oral BID BM   apixaban  5 mg Oral BID   chlorhexidine  15 mL Mouth Rinse BID   Chlorhexidine Gluconate Cloth  6 each Topical Daily   feeding supplement  237 mL Oral BID BM   folic acid  1 mg Oral Daily   gabapentin  600 mg Oral QHS   Gerhardt's butt cream   Topical BID   ipratropium-albuterol  3 mL Nebulization TID   linezolid  600 mg Oral Q12H   mouth rinse  15 mL Mouth Rinse q12n4p   melatonin  3 mg Oral QHS   metoprolol tartrate  25 mg Oral BID   mometasone-formoterol  2 puff Inhalation BID   multivitamin with minerals  1 tablet Oral Daily   nutrition supplement (JUVEN)  1 packet Oral BID BM   ondansetron (ZOFRAN) IV  4 mg Intravenous Once   pantoprazole  40 mg Oral QPM   potassium chloride  40 mEq Oral Q4H   pyridOXINE  50 mg Oral Daily   senna-docusate  1 tablet Oral BID   tamsulosin  0.4 mg Oral Daily   vitamin B-12  1,000 mcg Oral Daily    Continuous Infusions:  sodium chloride 250 mL (01/18/21 1123)    PRN Meds: acetaminophen **OR** acetaminophen, albuterol, alum & mag hydroxide-simeth, bisacodyl, HYDROmorphone, ondansetron  (ZOFRAN) IV, polyethylene glycol, polyvinyl alcohol, sodium chloride flush  Physical Exam         Awake alert No distress Regular work of breathing S 1 S 2  Abdomen soft, not tender Has bilateral dressings both LE, some edema.  No focal deficits.   Vital Signs: BP 123/70 (BP Location: Left Arm)   Pulse 69   Temp  98.1 F (36.7 C) (Oral)   Resp (!) 21   Ht 5' 6"  (1.676 m)   Wt 72.8 kg   SpO2 96%   BMI 25.92 kg/m  SpO2: SpO2: 96 % O2 Device: O2 Device: Nasal Cannula O2 Flow Rate: O2 Flow Rate (L/min): 1 L/min  Intake/output summary:  Intake/Output Summary (Last 24 hours) at 01/21/2021 1100 Last data filed at 01/21/2021 0500 Gross per 24 hour  Intake 720 ml  Output 3950 ml  Net -3230 ml    LBM: Last BM Date: 01/21/21 Baseline Weight: Weight: 66.9 kg Most recent weight: Weight: 72.8 kg       Palliative Assessment/Data:      Patient Active Problem List   Diagnosis Date Noted   Palliative care by specialist    General weakness    Sepsis ([place]) 01/16/2021   Sepsis due to cellulitis ([place]) 01/15/2021   Prolonged QT interval 01/15/2021   Pulmonary nodules 01/15/2021   AKI (acute kidney injury) ([place]) 01/15/2021   Open thigh wound    Citrobacter infection    Pseudomonas infection    Pressure injury of skin 12/21/2020   Fall    Hyperbilirubinemia    Malignant melanoma ([place])    Cellulitis 12/20/2020   Blisters of multiple sites 11/05/2020   Goals of care, counseling/discussion 10/12/2020   Atherosclerosis of aorta ([place]) 07/24/2020   Symptomatic anemia 05/27/2020   Pulmonary embolus ([place]) 05/27/2020   Acute respiratory failure with hypoxia ([place]) 05/26/2020   Callus of foot 04/21/2018   Edema 03/27/2018   Toxic gastroenteritis and colitis 03/25/2018   Other ulcerative 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 ([place]) 15/08/6977   Diarrhea 12/23/2017   Nausea & vomiting 12/23/2017   Herpes zoster 07/07/2017   Constipation 04/21/2017   Genetic testing 03/06/2017   Family  history of cancer    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[place]) 02/12/2017   Iron deficiency anemia due to chronic blood loss 12/27/2016   Scalp wound 06/13/2016   Scalp laceration, subsequent encounter 06/04/2016   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   Pain in joint, ankle and foot 09/28/2015   Anemia due to chronic blood loss 09/28/2015   Hyponatremia 09/28/2015   Cramp in limb 09/28/2015   Exertional angina ([place]) 06/17/2014   DOE (dyspnea on exertion) 04/28/2014   Hypokalemia 03/11/2014   Counseling regarding goals of care 12/31/2013   Impaired glucose tolerance 12/31/2013   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[place]) 11/18/2013   Fever 11/18/2013   Leukocytosis 10/30/2013   Fatigue 10/22/2013   Hoarseness of voice 08/10/2013   Sleep disorder 07/05/2013   Tension headache 07/01/2013   Polypharmacy 06/19/2013   Lumbar post-laminectomy syndrome 05/27/2013   Degenerative arthritis of spine 05/26/2013   Hemochromatosis 05/26/2013   Wound infection after surgery 05/26/2013   Normocytic anemia 05/26/2013   Unintentional weight loss 05/26/2013   Protein-calorie malnutrition, severe ([place]) 05/25/2013   Chronic pain syndrome 05/23/2013   COPD (chronic obstructive pulmonary disease) ([place]) 05/06/2013   Frequent falls 05/06/2013   Encounter for postoperative wound care 05/06/2013   Dehydration 03/17/2013   Atrial fibrillation ([place]) 08/07/2010   Rheumatoid arthritis ([place]) 10/18/2008   Anxiety 11/09/2007   Elevated lipids 03/30/2007   PSEUDOGOUT 03/30/2007   Essential hypertension 03/30/2007   Coronary atherosclerosis 03/30/2007    Palliative Care Assessment & Plan   Patient Profile:    Assessment:  75 y.o. male admitted on 01/15/2021   [name] is a 75 y.o. male with a PMH significant for CAD, CHF, COPD, maligant melanoma, paroxysmal  A-fib on Eliquis, HLD, prior DVT and PE, RA, HTN, and ETOH use who presented to the ED after suffering a fall at [facility].   Patient sees Dr Marin Olp for melanoma, found to be recurrent, based on PET scan in April 2022. Patient underwent excision of L thigh nodule, excision of L inguinal lymph node and with lymphedema post biopsy. Patient admitted with cellulitis of LLE recently. Remains admitted to the hospital for LLE cellulitis, hospital course with acute hypoxic resp failure, currently off BIPAP.   Recommendations/Plan: Continue current mode of care Recommend SNF rehab with palliative on discharge, patient agreeable to re consider [facility] towards the end of this hospitalization, TOC assisting with disposition.  Medication history reviewed.  Patient now on linezolid.  Would recommend holding fluoxetine until course of linezolid is completed so as to avoid risk of serotonin syndrome, pharmacy consult requested. We will take the liberty of switching him to regular diet, continue to monitor.   Goals of Care and Additional Recommendations: Limitations on Scope of Treatment: Full Scope Treatment  Code Status:    Code Status Orders  (From admission, onward)           Start     Ordered   01/15/21 1557  Do not attempt resuscitation (DNR)  Continuous       Question Answer Comment  In the event of cardiac or respiratory ARREST Do not call a "code blue"   In the event of cardiac or respiratory ARREST Do not perform Intubation, CPR, defibrillation or ACLS   In the event of cardiac or respiratory ARREST Use medication by any route, position, wound care, and other measures to relive pain and suffering. May use oxygen, suction and manual treatment of airway obstruction as needed for comfort.   Comments DNR confirmed with patient      01/15/21 1556           Code Status History     Date Active Date Inactive Code Status Order ID Comments User Context   12/20/2020 1800 12/27/2020 1732 DNR [id number]  Jonnie Finner, DO Inpatient   05/27/2020 0125 05/29/2020 1847 DNR [id number]  Lenore Cordia, MD  Inpatient   02/12/2017 1815 02/13/2017 1604 Full Code [id number]  Stark Klein, MD Inpatient   06/17/2014 0948 06/18/2014 1334 Full Code [id number]  Sherren Mocha, MD Inpatient   10/30/2013 0052 11/01/2013 1341 Full Code [id number]  Shanda Howells, MD ED   06/16/2013 2154 06/19/2013 1600 Full Code [id number]  Merton Border, MD Inpatient   06/08/2013 1534 06/10/2013 1911 Full Code [id number]  Luanne Bras, MD Inpatient   05/27/2013 1642 06/08/2013 1534 Full Code [id number]  Cathlyn Parsons, PA-C Inpatient   05/27/2013 1642 05/27/2013 1642 Full Code [id number]  Cathlyn Parsons, PA-C Inpatient   05/23/2013 2239 05/27/2013 1642 Full Code [id number]  Jonetta Osgood, MD Inpatient   05/06/2013 1552 05/09/2013 1603 Full Code [id number]  Robbie Lis, MD ED   04/13/2013 1713 04/24/2013 2037 Full Code [id number]  Orvan Falconer, MD Inpatient   03/16/2013 1650 04/06/2013 1626 Full Code [id number]  Floyce Stakes, MD Inpatient   03/02/2013 1741 03/10/2013 2021 Full Code [id number]  Floyce Stakes, MD Inpatient      Advance Directive Documentation    Flowsheet Row Most Recent Value  Type of Advance Directive Healthcare Power of Attorney  Pre-existing out of facility DNR order (yellow form or pink MOST form) --  "MOST" Form in Place? --  Prognosis:  Unable to determine  Discharge Planning: [facility] for rehab with Palliative care service follow-up  Care plan was discussed with  patient.   Thank you for allowing the Palliative Medicine Team to assist in the care of this patient.   Time In: 10 Time Out: 10.25 Total Time 25 Prolonged Time Billed  no       Greater than 50%  of this time was spent counseling and coordinating care related to the above assessment and plan.  Loistine Chance, MD  Please contact Palliative Medicine Team phone at [phone] for questions and concerns.

## 2021-01-21 NOTE — Progress Notes (Signed)
Patient up with walker to bathroom x2; patient needs help to stand, but once up ambulates well with walker.

## 2021-01-21 NOTE — Plan of Care (Signed)
  Problem: Clinical Measurements: Goal: Respiratory complications will improve Outcome: Progressing   Problem: Nutrition: Goal: Adequate nutrition will be maintained Outcome: Progressing   Problem: Safety: Goal: Ability to remain free from injury will improve Outcome: Progressing

## 2021-01-21 NOTE — Progress Notes (Signed)
PROGRESS NOTE    [name]  EYC:[id number] DOB: October 16, 1945 DOA: 01/15/2021 PCP: Cassandria Anger, MD   No chief complaint on file.   Brief Narrative:  75 y.o. male with medical history significant of alcohol abuse, allergic rhinitis, anxiety, CAD, chronic diastolic heart failure, chronic back pain, COPD, depression, diverticulosis, history of DVT and PE, history of single episode of A. fib, hemochromatosis, GERD, frequent falls, hyperlipidemia, chronic blood loss anemia, narcotic abuse history, history of neuropathy, osteoarthritis, rheumatoid arthritis presented from SNF weakness and multiple wounds on lower extremity.  On presentation, temperature was 103.2 with tachypnea.  White count was 15.5, lactic acid was 2.6 and then 2.3, creatinine of 2.02.  He was started on broad-spectrum antibiotics.  He subsequently required BiPAP and also had to be briefly started on pressors; he was transferred to ICU and PCCM took over his care on 01/17/2021.  He is off pressors currently and care has been transferred back to [facility] on 01/18/2021.     Assessment & Plan:   Principal Problem:   Sepsis due to cellulitis [facility]) Active Problems:   Anxiety   Essential hypertension   Coronary atherosclerosis   Atrial fibrillation (HCC)   COPD (chronic obstructive pulmonary disease) (HCC)   Lumbar post-laminectomy syndrome   Pulmonary embolus (HCC)   Open thigh wound   Prolonged QT interval   Pulmonary nodules   AKI (acute kidney injury) ([place])   Sepsis ([place])   Palliative care by specialist   General weakness  1 septic shock likely secondary lower extremity cellulitis -Patient presented with criteria for sepsis with fever, tachypnea, elevated lactic acid level, leukocytosis and evidence of lower extremity cellulitis. -Cellulitis noted to be more evident on the right lower extremity. -Patient was initially on IV vancomycin and Azactam. -Azactam discontinued. -COVID-19 PCR negative. -Patient  initially was transferred to the ICU briefly and placed on pressors. -Care taken over by PCCM on 01/17/2021, was transitioned off pressors and patient transferred back to [facility] service on 01/18/2021. -Hypotension improved. -Blood cultures with no growth to date. -Urine culture with multiple species. -Was on IV fluids which have subsequently been discontinued. -Discontinued IV vancomycin and patient started on Zyvox (01/20/2021) x 5 days.  -Supportive care.  2.  Acute hypoxemic respiratory failure/COPD -Patient noted to have a worsening respiratory status after hospitalization requiring BiPAP and felt respiratory status had worsened after oral Dilaudid use. -Patient seen by PCCM. -Currently off BiPAP. -Received a dose of Lasix yesterday -Sats of 96% on 1 L nasal cannula.  3.  Acute metabolic encephalopathy -Felt likely multifactorial from acute sepsis and narcotic use. -Patient given 1 dose of Narcan on 01/16/2021 with improvement with mental status. -Oral Dilaudid resumed on 01/17/2021 per PCCM. -Patient was placed on a decreased dose of Dilaudid however it is noted per Dr.Alekh that patient was adamant he wanted to continue current dose as he been on it for 6-7 years. -Follow.  4.  AKI -Resolved with hydration.  5.  Leukocytosis -Resolved.  6.  Microcytic anemia -Vitamin B12 supplementation, folic acid.  7.  Hypertension -BP soft. -Status post IV albumin.  -Blood pressure improved.  -On metoprolol.   8.  Paroxysmal atrial fibrillation -Metoprolol initially held due to soft/low blood pressure and resumed.  -Eliquis for anticoagulation.  9.  History of PE -Eliquis.  10.  Lower extremity wounds, POA -Continue dressing changes as recommended by wound care RN.  11.?  Pulmonary nodules on x-ray -Outpatient follow-up with PCP/pulmonary.  12.  Anxiety -Fluoxetine.   -  Alprazolam on hold.   13.  Generalized deconditioning -PT/OT.  14.  Recurrent melanoma/chronic  pain -Being followed by oncology. -Continue current pain management. -Per oncology.  15.  Hypokalemia -Secondary to diuresis. -Replete.   DVT prophylaxis: Eliquis Code Status: DNR Family Communication: Updated patient.  No family at bedside. Disposition:   Status is: Inpatient  Remains inpatient appropriate because: Inpatient level of care appropriate due to severity of illness         Consultants:  PCCM Oncology Palliative care  Procedures:  Chest x-ray 01/15/2021, 01/16/2021 Upper extremity Dopplers 01/18/2021  Antimicrobials:  IV aztreonam 01/15/2021>>> 01/18/2021 IV Flagyl 1010 2022x1 dose IV vancomycin 01/15/2021>>>>> 01/19/2021 Oral Zyvox 01/20/2021>>>>> 01/25/2021     Subjective: Sitting up in chair.  Complains of some nausea not relieved with Compazine.  No chest pain.  No shortness of breath.  Feels nausea was because he took too many pills at the same time.    Objective: Vitals:   01/20/21 1900 01/20/21 2115 01/21/21 0459 01/21/21 0800  BP: 125/64  123/70   Pulse: 80  69   Resp: 20  20 (!) 21  Temp: 98.2 F (36.8 C)  98.1 F (36.7 C)   TempSrc: Oral  Oral   SpO2: 93% 92% 96%   Weight:      Height:        Intake/Output Summary (Last 24 hours) at 01/21/2021 1046 Last data filed at 01/21/2021 0500 Gross per 24 hour  Intake 720 ml  Output 3950 ml  Net -3230 ml    Filed Weights   01/17/21 0500 01/18/21 0458 01/18/21 1228  Weight: 67.9 kg 71.2 kg 72.8 kg    Examination:  General exam: NAD. Respiratory system: Lungs clear to auscultation bilaterally.  No wheezes, no crackles, no rhonchi.  Normal respiratory effort. Cardiovascular system: RRR no murmurs rubs or gallops.  No JVD.  Trace to 1+ right lower extremity edema.   Gastrointestinal system: Abdomen is soft, nontender, nondistended, positive bowel sounds.  No rebound.  No guarding. GU: Penile/scrotal swelling improved. Central nervous system: Alert and oriented. No focal  neurological deficits. Extremities: Trace to 1+ mild right lower extremity edema. Skin: Bilateral lower extremity chronic skin changes with erythema and tenderness with wounds in the left thigh with dressing and right lower extremity skin tear.  Right lower extremity with some erythema.  No significant ecchymosis. Psychiatry: Judgement and insight appear normal. Mood & affect appropriate.     Data Reviewed: I have personally reviewed following labs and imaging studies  CBC: Recent Labs  Lab 01/16/21 0612 01/17/21 0219 01/18/21 0345 01/19/21 0336 01/20/21 0339 01/21/21 0132  WBC 14.7* 6.5 9.2 7.9 6.8 6.7  NEUTROABS 13.1* 6.0  --  6.2 5.2 5.0  HGB 10.0* 8.9* 8.2* 8.5* 8.0* 8.2*  HCT 33.7* 29.5* 27.5* 29.3* 26.8* 26.4*  MCV 102.4* 99.3 100.0 101.4* 100.4* 98.1  PLT 169 155 179 181 210 216     Basic Metabolic Panel: Recent Labs  Lab 01/17/21 0219 01/18/21 0345 01/19/21 0336 01/20/21 0339 01/21/21 0132  NA 142 142 138 139 137  K 3.4* 3.6 4.0 3.5 3.2*  CL 105 109 102 103 99  CO2 29 29 30  33* 34*  GLUCOSE 174* 196* 98 87 101*  BUN 34* 27* 29* 21 18  CREATININE 0.86 0.61 0.62 0.51* 0.48*  CALCIUM 7.6* 8.1* 8.1* 7.7* 7.9*  MG 2.3 2.4 2.3  --   --   PHOS  --  1.9*  --  2.8  --  GFR: Estimated Creatinine Clearance: 74.2 mL/min (A) (by C-G formula based on SCr of 0.48 mg/dL (L)).  Liver Function Tests: Recent Labs  Lab 01/15/21 1057 01/17/21 0219  AST 36 21  ALT 16 16  ALKPHOS 46 40  BILITOT 0.9 1.2  PROT 5.5* 4.7*  ALBUMIN 3.0* 2.4*     CBG: No results for input(s): GLUCAP in the last 168 hours.   Recent Results (from the past 240 hour(s))  Culture, blood (Routine x 2)     Status: None   Collection Time: 01/15/21 10:57 AM   Specimen: BLOOD  Result Value Ref Range Status   Specimen Description   Final    BLOOD LEFT ANTECUBITAL Performed at [facility] [address] [place], [place] [address]    Special Requests   Final     BOTTLES DRAWN AEROBIC AND ANAEROBIC Blood Culture adequate volume Performed at [facility] [address] [place], [place] [address]    Culture   Final    NO GROWTH 5 DAYS Performed at [facility], [place] [address] [place], [place] [address]    Report Status 01/20/2021 FINAL  Final  Culture, blood (Routine x 2)     Status: None   Collection Time: 01/15/21 12:31 PM   Specimen: BLOOD  Result Value Ref Range Status   Specimen Description   Final    BLOOD SITE NOT SPECIFIED Performed at [facility] [address] [place], [place] [address]    Special Requests   Final    BOTTLES DRAWN AEROBIC AND ANAEROBIC Blood Culture adequate volume Performed at [facility] [address] [place], [place] [address]    Culture   Final    NO GROWTH 5 DAYS Performed at [facility], [place] [address], [place], [place] [address]    Report Status 01/20/2021 FINAL  Final  Resp Panel by RT-PCR (Flu A&B, Covid)     Status: None   Collection Time: 01/15/21 12:31 PM   Specimen: Nasopharyngeal(NP) swabs in vial transport medium  Result Value Ref Range Status   SARS Coronavirus 2 by RT PCR NEGATIVE NEGATIVE Final    Comment: (NOTE) SARS-CoV-2 target nucleic acids are NOT DETECTED.  The SARS-CoV-2 RNA is generally detectable in upper respiratory specimens during the acute phase of infection. The lowest concentration of SARS-CoV-2 viral copies this assay can detect is 138 copies/mL. A negative result does not preclude SARS-Cov-2 infection and should not be used as the sole basis for treatment or other patient management decisions. A negative result may occur with  improper specimen collection/handling, submission of specimen other than nasopharyngeal swab, presence of viral mutation(s) within the areas targeted by this assay, and inadequate number of viral copies(<138 copies/mL). A negative result must be combined with clinical  observations, patient history, and epidemiological information. The expected result is Negative.  Fact Sheet for Patients:  EntrepreneurPulse.com.au  Fact Sheet for Healthcare Providers:  IncredibleEmployment.be  This test is no t yet approved or cleared by the [place] FDA and  has been authorized for detection and/or diagnosis of SARS-CoV-2 by FDA under an Emergency Use Authorization (EUA). This EUA will remain  in effect (meaning this test can be used) for the duration of the COVID-19 declaration under Section 564(b)(1) of the Act, 21 U.S.C.section 360bbb-3(b)(1), unless the authorization is terminated  or revoked sooner.       Influenza A by PCR NEGATIVE NEGATIVE Final   Influenza B  by PCR NEGATIVE NEGATIVE Final    Comment: (NOTE) The Xpert Xpress SARS-CoV-2/FLU/RSV plus assay is intended as an aid in the diagnosis of influenza from Nasopharyngeal swab specimens and should not be used as a sole basis for treatment. Nasal washings and aspirates are unacceptable for Xpert Xpress SARS-CoV-2/FLU/RSV testing.  Fact Sheet for Patients: EntrepreneurPulse.com.au  Fact Sheet for Healthcare Providers: IncredibleEmployment.be  This test is not yet approved or cleared by the [place] FDA and has been authorized for detection and/or diagnosis of SARS-CoV-2 by FDA under an Emergency Use Authorization (EUA). This EUA will remain in effect (meaning this test can be used) for the duration of the COVID-19 declaration under Section 564(b)(1) of the Act, 21 U.S.C. section 360bbb-3(b)(1), unless the authorization is terminated or revoked.  Performed at [facility], [place] [address], [place], [place] [address]   Urine Culture     Status: Abnormal   Collection Time: 01/15/21 11:00 PM   Specimen: In/Out Cath Urine  Result Value Ref Range Status   Specimen Description   Final    IN/OUT CATH  URINE Performed at [facility] [address], [place], [place] [address]    Special Requests   Final    NONE Performed at [facility], [place] [address] [place], [place] [address]    Culture MULTIPLE SPECIES PRESENT, SUGGEST RECOLLECTION (A)  Final   Report Status 01/16/2021 FINAL  Final  MRSA Next Gen by PCR, Nasal     Status: None   Collection Time: 01/16/21  6:05 PM   Specimen: Nasal Mucosa; Nasal Swab  Result Value Ref Range Status   MRSA by PCR Next Gen NOT DETECTED NOT DETECTED Final    Comment: (NOTE) The GeneXpert MRSA Assay (FDA approved for NASAL specimens only), is one component of a comprehensive MRSA colonization surveillance program. It is not intended to diagnose MRSA infection nor to guide or monitor treatment for MRSA infections. Test performance is not FDA approved in patients less than 44 years old. Performed at [facility], [place] [address] [place],  [address]           Radiology Studies: No results found.      Scheduled Meds:  (feeding supplement) PROSource Plus  30 mL Oral BID BM   apixaban  5 mg Oral BID   chlorhexidine  15 mL Mouth Rinse BID   Chlorhexidine Gluconate Cloth  6 each Topical Daily   feeding supplement  237 mL Oral BID BM   FLUoxetine  40 mg Oral Daily   folic acid  1 mg Oral Daily   gabapentin  600 mg Oral QHS   Gerhardt's butt cream   Topical BID   ipratropium-albuterol  3 mL Nebulization TID   linezolid  600 mg Oral Q12H   mouth rinse  15 mL Mouth Rinse q12n4p   melatonin  3 mg Oral QHS   metoprolol tartrate  25 mg Oral BID   mometasone-formoterol  2 puff Inhalation BID   multivitamin with minerals  1 tablet Oral Daily   nutrition supplement (JUVEN)  1 packet Oral BID BM   pantoprazole  40 mg Oral QPM   potassium chloride  40 mEq Oral Q4H   pyridOXINE  50 mg Oral Daily   senna-docusate  1 tablet Oral BID   tamsulosin  0.4 mg Oral Daily   vitamin B-12   1,000 mcg Oral Daily   Continuous Infusions:  sodium chloride 250 mL (01/18/21 1123)  LOS: 6 days    Time spent: 35 minutes    Irine Seal, MD [facility]   To contact the attending provider between 7A-7P or the covering provider during after hours 7P-7A, please log into the web site [email or web address] and access using universal [place] password for that web site. If you do not have the password, please call the hospital operator.  01/21/2021, 10:46 AM

## 2021-01-22 DIAGNOSIS — N179 Acute kidney failure, unspecified: Secondary | ICD-10-CM | POA: Diagnosis not present

## 2021-01-22 DIAGNOSIS — L0390 Cellulitis, unspecified: Secondary | ICD-10-CM | POA: Diagnosis not present

## 2021-01-22 DIAGNOSIS — I480 Paroxysmal atrial fibrillation: Secondary | ICD-10-CM | POA: Diagnosis not present

## 2021-01-22 DIAGNOSIS — Z515 Encounter for palliative care: Secondary | ICD-10-CM | POA: Diagnosis not present

## 2021-01-22 DIAGNOSIS — A419 Sepsis, unspecified organism: Secondary | ICD-10-CM | POA: Diagnosis not present

## 2021-01-22 DIAGNOSIS — F419 Anxiety disorder, unspecified: Secondary | ICD-10-CM | POA: Diagnosis not present

## 2021-01-22 LAB — CBC WITH DIFFERENTIAL/PLATELET
Abs Immature Granulocytes: 0.04 10*3/uL (ref 0.00–0.07)
Basophils Absolute: 0 10*3/uL (ref 0.0–0.1)
Basophils Relative: 0 %
Eosinophils Absolute: 0 10*3/uL (ref 0.0–0.5)
Eosinophils Relative: 0 %
HCT: 26.6 % — ABNORMAL LOW (ref 39.0–52.0)
Hemoglobin: 7.8 g/dL — ABNORMAL LOW (ref 13.0–17.0)
Immature Granulocytes: 1 %
Lymphocytes Relative: 17 %
Lymphs Abs: 1 10*3/uL (ref 0.7–4.0)
MCH: 29.1 pg (ref 26.0–34.0)
MCHC: 29.3 g/dL — ABNORMAL LOW (ref 30.0–36.0)
MCV: 99.3 fL (ref 80.0–100.0)
Monocytes Absolute: 0.7 10*3/uL (ref 0.1–1.0)
Monocytes Relative: 13 %
Neutro Abs: 3.8 10*3/uL (ref 1.7–7.7)
Neutrophils Relative %: 69 %
Platelets: 236 10*3/uL (ref 150–400)
RBC: 2.68 MIL/uL — ABNORMAL LOW (ref 4.22–5.81)
RDW: 14.5 % (ref 11.5–15.5)
WBC: 5.5 10*3/uL (ref 4.0–10.5)
nRBC: 0 % (ref 0.0–0.2)

## 2021-01-22 LAB — BASIC METABOLIC PANEL
Anion gap: 3 — ABNORMAL LOW (ref 5–15)
BUN: 14 mg/dL (ref 8–23)
CO2: 34 mmol/L — ABNORMAL HIGH (ref 22–32)
Calcium: 7.7 mg/dL — ABNORMAL LOW (ref 8.9–10.3)
Chloride: 100 mmol/L (ref 98–111)
Creatinine, Ser: 0.51 mg/dL — ABNORMAL LOW (ref 0.61–1.24)
GFR, Estimated: >60 mL/min (ref >60)
Glucose, Bld: 92 mg/dL (ref 70–99)
Potassium: 4.2 mmol/L (ref 3.5–5.1)
Sodium: 137 mmol/L (ref 135–145)

## 2021-01-22 LAB — PREPARE RBC (CROSSMATCH)

## 2021-01-22 MED ORDER — TRAZODONE HCL 50 MG PO TABS
50.0000 mg | ORAL_TABLET | Freq: Once | ORAL | Status: DC
Start: 2021-01-22 — End: 2021-01-22

## 2021-01-22 MED ORDER — FUROSEMIDE 10 MG/ML IJ SOLN
20.0000 mg | Freq: Once | INTRAMUSCULAR | Status: AC
  Administered 2021-01-22: 20 mg via INTRAVENOUS
  Filled 2021-01-22: qty 2

## 2021-01-22 MED ORDER — SODIUM CHLORIDE 0.9% IV SOLUTION: Freq: Once | INTRAVENOUS | Status: AC

## 2021-01-22 MED ORDER — TRAZODONE HCL 50 MG PO TABS: 50.0000 mg | ORAL_TABLET | Freq: Every evening | ORAL | Status: DC | PRN

## 2021-01-22 MED ORDER — FUROSEMIDE 10 MG/ML IJ SOLN
20.0000 mg | Freq: Once | INTRAMUSCULAR | Status: AC
  Administered 2021-01-22: 20 mg via INTRAVENOUS

## 2021-01-22 MED ORDER — HYDROMORPHONE HCL 1 MG/ML IJ SOLN
0.5000 mg | Freq: Once | INTRAMUSCULAR | Status: AC
  Administered 2021-01-22: 0.5 mg via INTRAVENOUS
  Filled 2021-01-22: qty 0.5

## 2021-01-22 MED ORDER — FUROSEMIDE 10 MG/ML IJ SOLN
20.0000 mg | Freq: Once | INTRAMUSCULAR | Status: DC
  Filled 2021-01-22: qty 2

## 2021-01-22 NOTE — Consult Note (Signed)
[facility] Inpatient Consult   01/22/2021  [name] 02-12-1946 [id number]  [place] [facility]  Patient chart has been reviewed for readmissions less than 30 days and for high risk score for unplanned readmissions. Patient assessed for community [place] [facility] follow up needs.    Per review, primary care office has chronic care management team.   Plan: Continue to follow for progression and disposition plans.  Of note, [facility] Care Management services does not replace or interfere with any services that are arranged by inpatient case management or social work.   Netta Cedars, MSN, [facility] Liaison Nurse Mobile Phone [phone]  Toll free office [phone]

## 2021-01-22 NOTE — Progress Notes (Signed)
PROGRESS NOTE    [name]  ZMO:[id number] DOB: February 24, 1946 DOA: 01/15/2021 PCP: Cassandria Anger, MD   No chief complaint on file.   Brief Narrative:  75 y.o. male with medical history significant of alcohol abuse, allergic rhinitis, anxiety, CAD, chronic diastolic heart failure, chronic back pain, COPD, depression, diverticulosis, history of DVT and PE, history of single episode of A. fib, hemochromatosis, GERD, frequent falls, hyperlipidemia, chronic blood loss anemia, narcotic abuse history, history of neuropathy, osteoarthritis, rheumatoid arthritis presented from SNF weakness and multiple wounds on lower extremity.  On presentation, temperature was 103.2 with tachypnea.  White count was 15.5, lactic acid was 2.6 and then 2.3, creatinine of 2.02.  He was started on broad-spectrum antibiotics.  He subsequently required BiPAP and also had to be briefly started on pressors; he was transferred to ICU and PCCM took over his care on 01/17/2021.  He is off pressors currently and care has been transferred back to [facility] on 01/18/2021.     Assessment & Plan:   Principal Problem:   Sepsis due to cellulitis [facility]) Active Problems:   Anxiety   Essential hypertension   Coronary atherosclerosis   Atrial fibrillation (HCC)   COPD (chronic obstructive pulmonary disease) (HCC)   Lumbar post-laminectomy syndrome   Pulmonary embolus (HCC)   Open thigh wound   Prolonged QT interval   Pulmonary nodules   AKI (acute kidney injury) ([place])   Sepsis ([place])   Palliative care by specialist   General weakness  1 septic shock likely secondary lower extremity cellulitis -Patient presented with criteria for sepsis with fever, tachypnea, elevated lactic acid level, leukocytosis and evidence of lower extremity cellulitis. -Cellulitis noted to be more evident on the right lower extremity. -Patient was initially on IV vancomycin and Azactam. -Azactam discontinued. -COVID-19 PCR negative. -Patient  initially was transferred to the ICU briefly and placed on pressors. -Care taken over by PCCM on 01/17/2021, was transitioned off pressors and patient transferred back to [facility] service on 01/18/2021. -Hypotension improved. -Blood cultures with no growth to date. -Urine culture with multiple species. -Was on IV fluids which have subsequently been discontinued. -Discontinued IV vancomycin and patient started on Zyvox (01/20/2021) x 5 days.  -Supportive care.  2.  Acute hypoxemic respiratory failure/COPD -Patient noted to have a worsening respiratory status after hospitalization requiring BiPAP and felt respiratory status had worsened after oral Dilaudid use. -Patient seen by PCCM. -Currently off BiPAP. -Received a dose of Lasix -Sats of 95% on 1 L nasal cannula.  3.  Acute metabolic encephalopathy -Felt likely multifactorial from acute sepsis and narcotic use. -Patient given 1 dose of Narcan on 01/16/2021 with improvement with mental status. -Oral Dilaudid resumed on 01/17/2021 per PCCM. -Patient was placed on a decreased dose of Dilaudid however it is noted per Dr.Alekh that patient was adamant he wanted to continue current dose as he been on it for 6-7 years. -Follow.  4.  AKI -Resolved with hydration.  5.  Leukocytosis -Resolved.  6.  Microcytic anemia -Vitamin B12 supplementation, folic acid. -Patient seen by hematology/oncology and 2 units packed red blood cells ordered per hematology/oncology. -Follow H&H.  7.  Hypertension -BP soft. -Status post IV albumin.  -Blood pressure improved.  -On metoprolol.  -2 units packed red blood cells ordered for transfusion per oncology.  8.  Paroxysmal atrial fibrillation -Metoprolol initially held due to soft/low blood pressure and resumed.  -Eliquis for anticoagulation.  9.  History of PE -Continue Eliquis.  10.  Lower extremity wounds,  POA -Continue dressing changes as recommended by wound care RN.  11.?  Pulmonary nodules on  x-ray -Outpatient follow-up with PCP/pulmonary.  12.  Anxiety -Continue fluoxetine.   -Alprazolam on hold.   13.  Generalized deconditioning -PT/OT.  14.  Recurrent melanoma/chronic pain -Being followed by oncology. -Continue current pain management. -Per oncology.  15.  Hypokalemia -Secondary to diuresis. -Repleted.   DVT prophylaxis: Eliquis Code Status: DNR Family Communication: Updated patient.  No family at bedside. Disposition:   Status is: Inpatient  Remains inpatient appropriate because: Inpatient level of care appropriate due to severity of illness         Consultants:  PCCM Oncology Palliative care  Procedures:  Chest x-ray 01/15/2021, 01/16/2021 Upper extremity Dopplers 01/18/2021 Transfuse 2 units packed red blood cells pending 01/22/2021  Antimicrobials:  IV aztreonam 01/15/2021>>> 01/18/2021 IV Flagyl 1010 2022x1 dose IV vancomycin 01/15/2021>>>>> 01/19/2021 Oral Zyvox 01/20/2021>>>>> 01/25/2021     Subjective: Laying in bed.  Improvement with nausea with Zofran.  No chest pain.  No shortness of breath.    Objective: Vitals:   01/22/21 0418 01/22/21 0831 01/22/21 1240 01/22/21 1305  BP: (!) 111/54  (!) 96/53 (!) 97/57  Pulse: 76  81 76  Resp: 20  20   Temp: (!) 97.5 F (36.4 C)  98.8 F (37.1 C) 98.6 F (37 C)  TempSrc: Oral  Oral Oral  SpO2: 95% 95% 95% 96%  Weight:      Height:        Intake/Output Summary (Last 24 hours) at 01/22/2021 1325 Last data filed at 01/22/2021 1300 Gross per 24 hour  Intake 125.17 ml  Output 1700 ml  Net -1574.83 ml    Filed Weights   01/17/21 0500 01/18/21 0458 01/18/21 1228  Weight: 67.9 kg 71.2 kg 72.8 kg    Examination:  General exam: NAD. Respiratory system: Lungs clear to auscultation bilaterally anterior lung fields.  No wheezes, no crackles, no rhonchi.  Normal respiratory effort.   Cardiovascular system: Regular rate and rhythm no murmurs rubs or gallops.  No JVD.  Trace to 1+  right lower extremity edema.  Gastrointestinal system: Abdomen is soft, nontender, nondistended, positive bowel sounds.  No rebound.  No guarding.  GU: Penile/scrotal swelling improved. Central nervous system: Alert and oriented. No focal neurological deficits. Extremities: Trace to 1+ mild right lower extremity edema. Skin: Bilateral lower extremity chronic skin changes with erythema and tenderness with wounds in the left thigh with dressing and right lower extremity skin tear.  Right lower extremity with some erythema.  No significant ecchymosis. Psychiatry: Judgement and insight appear normal. Mood & affect appropriate.     Data Reviewed: I have personally reviewed following labs and imaging studies  CBC: Recent Labs  Lab 01/17/21 0219 01/18/21 0345 01/19/21 0336 01/20/21 0339 01/21/21 0132 01/22/21 0325  WBC 6.5 9.2 7.9 6.8 6.7 5.5  NEUTROABS 6.0  --  6.2 5.2 5.0 3.8  HGB 8.9* 8.2* 8.5* 8.0* 8.2* 7.8*  HCT 29.5* 27.5* 29.3* 26.8* 26.4* 26.6*  MCV 99.3 100.0 101.4* 100.4* 98.1 99.3  PLT 155 179 181 210 216 236     Basic Metabolic Panel: Recent Labs  Lab 01/17/21 0219 01/18/21 0345 01/19/21 0336 01/20/21 0339 01/21/21 0132 01/22/21 0325  NA 142 142 138 139 137 137  K 3.4* 3.6 4.0 3.5 3.2* 4.2  CL 105 109 102 103 99 100  CO2 29 29 30  33* 34* 34*  GLUCOSE 174* 196* 98 87 101* 92  BUN 34* 27*  29* 21 18 14   CREATININE 0.86 0.61 0.62 0.51* 0.48* 0.51*  CALCIUM 7.6* 8.1* 8.1* 7.7* 7.9* 7.7*  MG 2.3 2.4 2.3  --   --   --   PHOS  --  1.9*  --  2.8  --   --      GFR: Estimated Creatinine Clearance: 74.2 mL/min (A) (by C-G formula based on SCr of 0.51 mg/dL (L)).  Liver Function Tests: Recent Labs  Lab 01/17/21 0219  AST 21  ALT 16  ALKPHOS 40  BILITOT 1.2  PROT 4.7*  ALBUMIN 2.4*     CBG: No results for input(s): GLUCAP in the last 168 hours.   Recent Results (from the past 240 hour(s))  Culture, blood (Routine x 2)     Status: None   Collection  Time: 01/15/21 10:57 AM   Specimen: BLOOD  Result Value Ref Range Status   Specimen Description   Final    BLOOD LEFT ANTECUBITAL Performed at [facility] [address], [place], [place] [address]    Special Requests   Final    BOTTLES DRAWN AEROBIC AND ANAEROBIC Blood Culture adequate volume Performed at [facility] [address], [place], [place] [address]    Culture   Final    NO GROWTH 5 DAYS Performed at [facility], [place] [address], [place], [place] [address]    Report Status 01/20/2021 FINAL  Final  Culture, blood (Routine x 2)     Status: None   Collection Time: 01/15/21 12:31 PM   Specimen: BLOOD  Result Value Ref Range Status   Specimen Description   Final    BLOOD SITE NOT SPECIFIED Performed at [facility] [address] [place], [place] [address]    Special Requests   Final    BOTTLES DRAWN AEROBIC AND ANAEROBIC Blood Culture adequate volume Performed at [facility] [address] [place], [place] [address]    Culture   Final    NO GROWTH 5 DAYS Performed at [facility], [place] [address] [place], [place] [address]    Report Status 01/20/2021 FINAL  Final  Resp Panel by RT-PCR (Flu A&B, Covid)     Status: None   Collection Time: 01/15/21 12:31 PM   Specimen: Nasopharyngeal(NP) swabs in vial transport medium  Result Value Ref Range Status   SARS Coronavirus 2 by RT PCR NEGATIVE NEGATIVE Final    Comment: (NOTE) SARS-CoV-2 target nucleic acids are NOT DETECTED.  The SARS-CoV-2 RNA is generally detectable in upper respiratory specimens during the acute phase of infection. The lowest concentration of SARS-CoV-2 viral copies this assay can detect is 138 copies/mL. A negative result does not preclude SARS-Cov-2 infection and should not be used as the sole basis for treatment or other patient management decisions. A negative result may occur with  improper specimen  collection/handling, submission of specimen other than nasopharyngeal swab, presence of viral mutation(s) within the areas targeted by this assay, and inadequate number of viral copies(<138 copies/mL). A negative result must be combined with clinical observations, patient history, and epidemiological information. The expected result is Negative.  Fact Sheet for Patients:  EntrepreneurPulse.com.au  Fact Sheet for Healthcare Providers:  IncredibleEmployment.be  This test is no t yet approved or cleared by the [place] FDA and  has been authorized for detection and/or diagnosis of SARS-CoV-2 by FDA under an Emergency Use Authorization (EUA). This EUA will remain  in effect (meaning this  test can be used) for the duration of the COVID-19 declaration under Section 564(b)(1) of the Act, 21 U.S.C.section 360bbb-3(b)(1), unless the authorization is terminated  or revoked sooner.       Influenza A by PCR NEGATIVE NEGATIVE Final   Influenza B by PCR NEGATIVE NEGATIVE Final    Comment: (NOTE) The Xpert Xpress SARS-CoV-2/FLU/RSV plus assay is intended as an aid in the diagnosis of influenza from Nasopharyngeal swab specimens and should not be used as a sole basis for treatment. Nasal washings and aspirates are unacceptable for Xpert Xpress SARS-CoV-2/FLU/RSV testing.  Fact Sheet for Patients: EntrepreneurPulse.com.au  Fact Sheet for Healthcare Providers: IncredibleEmployment.be  This test is not yet approved or cleared by the [place] FDA and has been authorized for detection and/or diagnosis of SARS-CoV-2 by FDA under an Emergency Use Authorization (EUA). This EUA will remain in effect (meaning this test can be used) for the duration of the COVID-19 declaration under Section 564(b)(1) of the Act, 21 U.S.C. section 360bbb-3(b)(1), unless the authorization is terminated or revoked.  Performed at [facility], [place] [address] [place], [place] [address]   Urine Culture     Status: Abnormal   Collection Time: 01/15/21 11:00 PM   Specimen: In/Out Cath Urine  Result Value Ref Range Status   Specimen Description   Final    IN/OUT CATH URINE Performed at [facility] [address] [place], [place] [address]    Special Requests   Final    NONE Performed at [facility], [place] [address], [place], [place] [address]    Culture MULTIPLE SPECIES PRESENT, SUGGEST RECOLLECTION (A)  Final   Report Status 01/16/2021 FINAL  Final  MRSA Next Gen by PCR, Nasal     Status: None   Collection Time: 01/16/21  6:05 PM   Specimen: Nasal Mucosa; Nasal Swab  Result Value Ref Range Status   MRSA by PCR Next Gen NOT DETECTED NOT DETECTED Final    Comment: (NOTE) The GeneXpert MRSA Assay (FDA approved for NASAL specimens only), is one component of a comprehensive MRSA colonization surveillance program. It is not intended to diagnose MRSA infection nor to guide or monitor treatment for MRSA infections. Test performance is not FDA approved in patients less than 79 years old. Performed at [facility], [place] [address], [place], [place] [address]           Radiology Studies: No results found.      Scheduled Meds:  (feeding supplement) PROSource Plus  30 mL Oral BID BM   apixaban  5 mg Oral BID   chlorhexidine  15 mL Mouth Rinse BID   Chlorhexidine Gluconate Cloth  6 each Topical Daily   feeding supplement  237 mL Oral BID BM   folic acid  1 mg Oral Daily   furosemide  20 mg Intravenous Once   gabapentin  600 mg Oral QHS   Gerhardt's butt cream   Topical BID   ipratropium-albuterol  3 mL Nebulization TID   linezolid  600 mg Oral Q12H   mouth rinse  15 mL Mouth Rinse q12n4p   melatonin  3 mg Oral QHS   metoprolol tartrate  25 mg Oral BID   mometasone-formoterol  2 puff Inhalation BID   multivitamin with minerals  1  tablet Oral Daily   nutrition supplement (JUVEN)  1 packet Oral BID BM   pantoprazole  40 mg Oral QPM   pyridOXINE  50 mg Oral  Daily   senna-docusate  1 tablet Oral BID   tamsulosin  0.4 mg Oral Daily   vitamin B-12  1,000 mcg Oral Daily   Continuous Infusions:  sodium chloride Stopped (01/20/21 2041)     LOS: 7 days    Time spent: 35 minutes    Irine Seal, MD [facility]   To contact the attending provider between 7A-7P or the covering provider during after hours 7P-7A, please log into the web site [email or web address] and access using universal [place] password for that web site. If you do not have the password, please call the hospital operator.  01/22/2021, 1:25 PM

## 2021-01-22 NOTE — TOC Progression Note (Signed)
Transition of Care [facility]) - Progression Note    Patient Details  Name: JEFFIE SPIVACK MRN: [id number] Date of Birth: 1946-03-09  [facility] CM/SW Contact  Ross Ludwig, [place] Phone Number: 01/22/2021, 5:54 PM  Clinical Narrative:     Patient is from [facility].  Patient is now considering SNF placement, patient has been faxed out awaiting bed offers.  CSW tried to call patient's wife to discuss if they are both agreeable to SNF or if they still want to do home health.  CSW awaiting for phone call back.   Expected Discharge Plan: Assisted Living Barriers to Discharge: Continued Medical Work up  Expected Discharge Plan and Services Expected Discharge Plan: Assisted Living In-house Referral: Clinical Social Work     Living arrangements for the past 2 months: Assisted Living Facility                 DME Arranged: N/A DME Agency: NA                   Social Determinants of Health (SDOH) Interventions    Readmission Risk Interventions Readmission Risk Prevention Plan 01/17/2021  Transportation Screening Complete  HRI or Home Care Consult Complete  SW Recovery Care/Counseling Consult Complete  Palliative Care Screening (No Data)  Some recent data might be hidden

## 2021-01-22 NOTE — Progress Notes (Signed)
Daily Progress Note   Patient Name: [name]       Date: 01/22/2021 DOB: Aug 28, 1945  Age: 75 y.o. MRN#: [id number] Attending Physician: Eugenie Filler, MD Primary Care Physician: Cassandria Anger, MD Admit Date: 01/15/2021  Reason for Consultation/Follow-up: Establishing goals of care  Subjective: Awake alert, no distress, denies nausea.    Length of Stay: 7  Current Medications: Scheduled Meds:   (feeding supplement) PROSource Plus  30 mL Oral BID BM   sodium chloride   Intravenous Once   apixaban  5 mg Oral BID   chlorhexidine  15 mL Mouth Rinse BID   Chlorhexidine Gluconate Cloth  6 each Topical Daily   feeding supplement  237 mL Oral BID BM   folic acid  1 mg Oral Daily   furosemide  20 mg Intravenous Once   gabapentin  600 mg Oral QHS   Gerhardt's butt cream   Topical BID   ipratropium-albuterol  3 mL Nebulization TID   linezolid  600 mg Oral Q12H   mouth rinse  15 mL Mouth Rinse q12n4p   melatonin  3 mg Oral QHS   metoprolol tartrate  25 mg Oral BID   mometasone-formoterol  2 puff Inhalation BID   multivitamin with minerals  1 tablet Oral Daily   nutrition supplement (JUVEN)  1 packet Oral BID BM   pantoprazole  40 mg Oral QPM   pyridOXINE  50 mg Oral Daily   senna-docusate  1 tablet Oral BID   tamsulosin  0.4 mg Oral Daily   vitamin B-12  1,000 mcg Oral Daily    Continuous Infusions:  sodium chloride 250 mL (01/18/21 1123)    PRN Meds: acetaminophen **OR** acetaminophen, albuterol, alum & mag hydroxide-simeth, bisacodyl, HYDROmorphone, ondansetron (ZOFRAN) IV, polyethylene glycol, polyvinyl alcohol, sodium chloride flush  Physical Exam         Awake alert No distress Regular work of breathing S 1 S 2  Abdomen soft, not tender Has  bilateral dressings both LE, some edema.  No focal deficits.   Vital Signs: BP (!) 111/54 (BP Location: Left Arm)   Pulse 76   Temp (!) 97.5 F (36.4 C) (Oral)   Resp 20   Ht 5' 6"  (1.676 m)   Wt 72.8 kg   SpO2 95%   BMI 25.92  kg/m  SpO2: SpO2: 95 % O2 Device: O2 Device: Room Air O2 Flow Rate: O2 Flow Rate (L/min): 2 L/min  Intake/output summary:  Intake/Output Summary (Last 24 hours) at 01/22/2021 1024 Last data filed at 01/22/2021 0926 Gross per 24 hour  Intake 120 ml  Output 300 ml  Net -180 ml    LBM: Last BM Date: 01/21/21 Baseline Weight: Weight: 66.9 kg Most recent weight: Weight: 72.8 kg       Palliative Assessment/Data:      Patient Active Problem List   Diagnosis Date Noted   Palliative care by specialist    General weakness    Sepsis ([place]) 01/16/2021   Sepsis due to cellulitis ([place]) 01/15/2021   Prolonged QT interval 01/15/2021   Pulmonary nodules 01/15/2021   AKI (acute kidney injury) ([place]) 01/15/2021   Open thigh wound    Citrobacter infection    Pseudomonas infection    Pressure injury of skin 12/21/2020   Fall    Hyperbilirubinemia    Malignant melanoma ([place])    Cellulitis 12/20/2020   Blisters of multiple sites 11/05/2020   Goals of care, counseling/discussion 10/12/2020   Atherosclerosis of aorta ([place]) 07/24/2020   Symptomatic anemia 05/27/2020   Pulmonary embolus ([place]) 05/27/2020   Acute respiratory failure with hypoxia ([place]) 05/26/2020   Callus of foot 04/21/2018   Edema 03/27/2018   Toxic gastroenteritis and colitis 03/25/2018   Other ulcerative 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 ([place]) 91/69/4503   Diarrhea 12/23/2017   Nausea & vomiting 12/23/2017   Herpes zoster 07/07/2017   Constipation 04/21/2017   Genetic testing 03/06/2017   Family history of cancer    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[place]) 02/12/2017   Iron deficiency anemia due to chronic blood loss 12/27/2016   Scalp wound 06/13/2016   Scalp laceration, subsequent  encounter 06/04/2016   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   Pain in joint, ankle and foot 09/28/2015   Anemia due to chronic blood loss 09/28/2015   Hyponatremia 09/28/2015   Cramp in limb 09/28/2015   Exertional angina ([place]) 06/17/2014   DOE (dyspnea on exertion) 04/28/2014   Hypokalemia 03/11/2014   Counseling regarding goals of care 12/31/2013   Impaired glucose tolerance 12/31/2013   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[place]) 11/18/2013   Fever 11/18/2013   Leukocytosis 10/30/2013   Fatigue 10/22/2013   Hoarseness of voice 08/10/2013   Sleep disorder 07/05/2013   Tension headache 07/01/2013   Polypharmacy 06/19/2013   Lumbar post-laminectomy syndrome 05/27/2013   Degenerative arthritis of spine 05/26/2013   Hemochromatosis 05/26/2013   Wound infection after surgery 05/26/2013   Normocytic anemia 05/26/2013   Unintentional weight loss 05/26/2013   Protein-calorie malnutrition, severe ([place]) 05/25/2013   Chronic pain syndrome 05/23/2013   COPD (chronic obstructive pulmonary disease) ([place]) 05/06/2013   Frequent falls 05/06/2013   Encounter for postoperative wound care 05/06/2013   Dehydration 03/17/2013   Atrial fibrillation ([place]) 08/07/2010   Rheumatoid arthritis ([place]) 10/18/2008   Anxiety 11/09/2007   Elevated lipids 03/30/2007   PSEUDOGOUT 03/30/2007   Essential hypertension 03/30/2007   Coronary atherosclerosis 03/30/2007    Palliative Care Assessment & Plan   Patient Profile:    Assessment:  75 y.o. male admitted on 01/15/2021   [name] is a 75 y.o. male with a PMH significant for CAD, CHF, COPD, maligant melanoma, paroxysmal A-fib on Eliquis, HLD, prior DVT and PE, RA, HTN, and ETOH use who presented to the ED after suffering a fall at [facility].   Patient sees  Dr Marin Olp for melanoma, found to be recurrent, based on PET scan in April 2022. Patient underwent excision of L thigh nodule, excision of L inguinal lymph node and with  lymphedema post biopsy. Patient admitted with cellulitis of LLE recently. Remains admitted to the hospital for LLE cellulitis, hospital course with acute hypoxic resp failure, currently off BIPAP.   Recommendations/Plan: Continue current mode of care Recommend SNF rehab with palliative on discharge, patient agreeable to re consider [facility] towards the end of this hospitalization, TOC assisting with disposition.   Goals of Care and Additional Recommendations: Limitations on Scope of Treatment: Full Scope Treatment  Code Status:    Code Status Orders  (From admission, onward)           Start     Ordered   01/15/21 1557  Do not attempt resuscitation (DNR)  Continuous       Question Answer Comment  In the event of cardiac or respiratory ARREST Do not call a "code blue"   In the event of cardiac or respiratory ARREST Do not perform Intubation, CPR, defibrillation or ACLS   In the event of cardiac or respiratory ARREST Use medication by any route, position, wound care, and other measures to relive pain and suffering. May use oxygen, suction and manual treatment of airway obstruction as needed for comfort.   Comments DNR confirmed with patient      01/15/21 1556           Code Status History     Date Active Date Inactive Code Status Order ID Comments User Context   12/20/2020 1800 12/27/2020 1732 DNR [id number]  Jonnie Finner, DO Inpatient   05/27/2020 0125 05/29/2020 1847 DNR [id number]  Lenore Cordia, MD Inpatient   02/12/2017 1815 02/13/2017 1604 Full Code [id number]  Stark Klein, MD Inpatient   06/17/2014 0948 06/18/2014 1334 Full Code [id number]  Sherren Mocha, MD Inpatient   10/30/2013 0052 11/01/2013 1341 Full Code [id number]  Shanda [facility], MD ED   06/16/2013 2154 06/19/2013 1600 Full Code [id number]  Merton Border, MD Inpatient   06/08/2013 1534 06/10/2013 1911 Full Code [id number]  Luanne Bras, MD Inpatient   05/27/2013 1642 06/08/2013 1534 Full Code [id number]  Cathlyn Parsons, PA-C Inpatient   05/27/2013 1642 05/27/2013 1642 Full Code [id number]  Cathlyn Parsons, PA-C Inpatient   05/23/2013 2239 05/27/2013 1642 Full Code [id number]  Jonetta Osgood, MD Inpatient   05/06/2013 1552 05/09/2013 1603 Full Code [id number]  Robbie Lis, MD ED   04/13/2013 1713 04/24/2013 2037 Full Code [id number]  Orvan Falconer, MD Inpatient   03/16/2013 1650 04/06/2013 1626 Full Code [id number]  Floyce Stakes, MD Inpatient   03/02/2013 1741 03/10/2013 2021 Full Code [id number]  Floyce Stakes, MD Inpatient      Advance Directive Documentation    Flowsheet Row Most Recent Value  Type of Advance Directive Healthcare Power of Attorney  Pre-existing out of facility DNR order (yellow form or pink MOST form) --  "MOST" Form in Place? --       Prognosis:  Unable to determine  Discharge Planning: [facility] for rehab with Palliative care service follow-up  Care plan was discussed with  patient.   Thank you for allowing the Palliative Medicine Team to assist in the care of this patient.   Time In: 10 Time Out: 10.15 Total Time 15 Prolonged Time Billed  no       Greater than 50%  of this time was  spent counseling and coordinating care related to the above assessment and plan.  Loistine Chance, MD  Please contact Palliative Medicine Team phone at [phone] for questions and concerns.

## 2021-01-22 NOTE — NC FL2 (Signed)
[place] LEVEL OF CARE SCREENING TOOL     IDENTIFICATION  Patient Name: [name] Birthdate: 10/19/1945 Sex: male Admission Date (Current Location): 01/15/2021  [facility] and [place] Number:  Herbalist and Address:  [facility],  [place], [place]      Provider Number: [id number]  Attending Physician Name and Address:  Eugenie Filler, MD  Relative Name and Phone Number:  Croft,Debbie Significant other   [phone]  Deondra, Wigger   [phone]  Rhodia Albright Daughter   [phone]    Current Level of Care: Hospital Recommended Level of Care: [place] Prior Approval Number:    Date Approved/Denied:   PASRR Number: [id number]  Discharge Plan: SNF    Current Diagnoses: Patient Active Problem List   Diagnosis Date Noted   Palliative care by specialist    General weakness    Sepsis ([place]) 01/16/2021   Sepsis due to cellulitis ([place]) 01/15/2021   Prolonged QT interval 01/15/2021   Pulmonary nodules 01/15/2021   AKI (acute kidney injury) ([place]) 01/15/2021   Open thigh wound    Citrobacter infection    Pseudomonas infection    Pressure injury of skin 12/21/2020   Fall    Hyperbilirubinemia    Malignant melanoma ([place])    Cellulitis 12/20/2020   Blisters of multiple sites 11/05/2020   Goals of care, counseling/discussion 10/12/2020   Atherosclerosis of aorta ([place]) 07/24/2020   Symptomatic anemia 05/27/2020   Pulmonary embolus ([place]) 05/27/2020   Acute respiratory failure with hypoxia ([place]) 05/26/2020   Callus of foot 04/21/2018   Edema 03/27/2018   Toxic gastroenteritis and colitis 03/25/2018   Other ulcerative 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 ([place]) 23/36/1224   Diarrhea 12/23/2017   Nausea & vomiting 12/23/2017   Herpes zoster 07/07/2017   Constipation 04/21/2017   Genetic testing 03/06/2017   Family history of cancer    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[place]) 02/12/2017   Iron  deficiency anemia due to chronic blood loss 12/27/2016   Scalp wound 06/13/2016   Scalp laceration, subsequent encounter 06/04/2016   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   Pain in joint, ankle and foot 09/28/2015   Anemia due to chronic blood loss 09/28/2015   Hyponatremia 09/28/2015   Cramp in limb 09/28/2015   Exertional angina ([place]) 06/17/2014   DOE (dyspnea on exertion) 04/28/2014   Hypokalemia 03/11/2014   Counseling regarding goals of care 12/31/2013   Impaired glucose tolerance 12/31/2013   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[place]) 11/18/2013   Fever 11/18/2013   Leukocytosis 10/30/2013   Fatigue 10/22/2013   Hoarseness of voice 08/10/2013   Sleep disorder 07/05/2013   Tension headache 07/01/2013   Polypharmacy 06/19/2013   Lumbar post-laminectomy syndrome 05/27/2013   Degenerative arthritis of spine 05/26/2013   Hemochromatosis 05/26/2013   Wound infection after surgery 05/26/2013   Normocytic anemia 05/26/2013   Unintentional weight loss 05/26/2013   Protein-calorie malnutrition, severe ([place]) 05/25/2013   Chronic pain syndrome 05/23/2013   COPD (chronic obstructive pulmonary disease) ([place]) 05/06/2013   Frequent falls 05/06/2013   Encounter for postoperative wound care 05/06/2013   Dehydration 03/17/2013   Atrial fibrillation ([place]) 08/07/2010   Rheumatoid arthritis ([place]) 10/18/2008   Anxiety 11/09/2007   Elevated lipids 03/30/2007   PSEUDOGOUT 03/30/2007   Essential hypertension 03/30/2007   Coronary atherosclerosis 03/30/2007    Orientation RESPIRATION BLADDER Height & Weight     Self, Situation, Time, Place  O2 (1L) Continent Weight: 160 lb 9 oz (72.8 kg)  Height:  5' 6"  (167.6 cm)  BEHAVIORAL SYMPTOMS/MOOD NEUROLOGICAL BOWEL NUTRITION STATUS      Continent Diet  AMBULATORY STATUS COMMUNICATION OF NEEDS Skin   Limited Assist Verbally Surgical wounds                       Personal Care Assistance Level of Assistance  Bathing, Feeding, Dressing  Bathing Assistance: Limited assistance Feeding assistance: Limited assistance Dressing Assistance: Limited assistance     Functional Limitations Info  Sight, Hearing, Speech Sight Info: Adequate Hearing Info: Adequate Speech Info: Adequate    SPECIAL CARE FACTORS FREQUENCY  PT (By licensed PT), OT (By licensed OT)     PT Frequency: Minimum 5x a week OT Frequency: Minimum 5x a week            Contractures Contractures Info: Not present    Additional Factors Info  Code Status, Allergies Code Status Info: DNR Allergies Info: Cefepime   Cephaeline   Morphine   Morphine And Related   Penicillins   Doxycycline   Oxycodone-acetaminophen           Current Medications (01/22/2021):  This is the current hospital active medication list Current Facility-Administered Medications  Medication Dose Route Frequency Provider Last Rate Last Admin   (feeding supplement) PROSource Plus liquid 30 mL  30 mL Oral BID BM Olalere, Adewale A, MD   30 mL at 01/20/21 2059   0.9 %  sodium chloride infusion  250 mL Intravenous Continuous Tilden Dome, MD   Stopped at 01/20/21 2041   acetaminophen (TYLENOL) tablet 650 mg  650 mg Oral Q6H PRN Reubin Milan, MD   650 mg at 01/16/21 0539   Or   acetaminophen (TYLENOL) suppository 650 mg  650 mg Rectal Q6H PRN Reubin Milan, MD       albuterol (PROVENTIL) (2.5 MG/3ML) 0.083% nebulizer solution 2.5 mg  2.5 mg Nebulization Q4H PRN Reubin Milan, MD   2.5 mg at 01/16/21 1203   alum & mag hydroxide-simeth (MAALOX/MYLANTA) 200-200-20 MG/5ML suspension 30 mL  30 mL Oral Q6H PRN Eugenie Filler, MD       apixaban Arne [place]) tablet 5 mg  5 mg Oral BID Reubin Milan, MD   5 mg at 01/22/21 7673   bisacodyl (DULCOLAX) suppository 10 mg  10 mg Rectal Daily PRN Aline August, MD       chlorhexidine (PERIDEX) 0.12 % solution 15 mL  15 mL Mouth Rinse BID Starla Link, Kshitiz, MD   15 mL at 01/22/21 0958   Chlorhexidine Gluconate Cloth 2 % PADS 6  each  6 each Topical Daily Aline August, MD   6 each at 01/22/21 0924   feeding supplement (ENSURE ENLIVE / ENSURE PLUS) liquid 237 mL  237 mL Oral BID BM Olalere, Adewale A, MD   237 mL at 41/93/79 0240   folic acid (FOLVITE) tablet 1 mg  1 mg Oral Daily Alekh, Kshitiz, MD   1 mg at 01/22/21 0959   furosemide (LASIX) injection 20 mg  20 mg Intravenous Once Green, Terri L, RPH       gabapentin (NEURONTIN) capsule 600 mg  600 mg Oral QHS Reubin Milan, MD   600 mg at 01/21/21 2235   Gerhardt's butt cream   Topical BID Sherrilyn Rist A, MD   Given at 01/22/21 1003   HYDROmorphone (DILAUDID) tablet 2 mg  2 mg Oral Q6H PRN Sherrilyn Rist A, MD   2 mg at 01/22/21  1152   ipratropium-albuterol (DUONEB) 0.5-2.5 (3) MG/3ML nebulizer solution 3 mL  3 mL Nebulization TID Eugenie Filler, MD   3 mL at 01/22/21 1440   linezolid (ZYVOX) tablet 600 mg  600 mg Oral Q12H Eugenie Filler, MD   600 mg at 01/22/21 1003   MEDLINE mouth rinse  15 mL Mouth Rinse q12n4p Alekh, Kshitiz, MD   15 mL at 01/22/21 1258   melatonin tablet 3 mg  3 mg Oral QHS Eugenie Filler, MD   3 mg at 01/21/21 2235   metoprolol tartrate (LOPRESSOR) tablet 25 mg  25 mg Oral BID Aline August, MD   25 mg at 01/22/21 0959   mometasone-formoterol (DULERA) 200-5 MCG/ACT inhaler 2 puff  2 puff Inhalation BID Olalere, Adewale A, MD   2 puff at 01/22/21 0830   multivitamin with minerals tablet 1 tablet  1 tablet Oral Daily Olalere, Adewale A, MD   1 tablet at 01/22/21 7322   nutrition supplement (JUVEN) (JUVEN) powder packet 1 packet  1 packet Oral BID BM Olalere, Adewale A, MD   1 packet at 01/22/21 1337   ondansetron (ZOFRAN) injection 4 mg  4 mg Intravenous Q6H PRN Eugenie Filler, MD   4 mg at 01/21/21 1951   pantoprazole (PROTONIX) EC tablet 40 mg  40 mg Oral QPM Reubin Milan, MD   40 mg at 01/21/21 1750   polyethylene glycol (MIRALAX / GLYCOLAX) packet 17 g  17 g Oral Daily PRN Aline August, MD   17 g at  01/18/21 1414   polyvinyl alcohol (LIQUIFILM TEARS) 1.4 % ophthalmic solution 1 drop  1 drop Both Eyes PRN Reubin Milan, MD       pyridOXINE (VITAMIN B-6) tablet 50 mg  50 mg Oral Daily Reubin Milan, MD   50 mg at 01/22/21 1002   senna-docusate (Senokot-S) tablet 1 tablet  1 tablet Oral BID Aline August, MD   1 tablet at 01/22/21 0958   sodium chloride flush (NS) 0.9 % injection 10-40 mL  10-40 mL Intracatheter PRN Eugenie Filler, MD       tamsulosin [facility]) capsule 0.4 mg  0.4 mg Oral Daily Olalere, Adewale A, MD   0.4 mg at 01/22/21 0254   vitamin B-12 (CYANOCOBALAMIN) tablet 1,000 mcg  1,000 mcg Oral Daily Reubin Milan, MD   1,000 mcg at 01/22/21 2706   Facility-Administered Medications Ordered in Other Encounters  Medication Dose Route Frequency Provider Last Rate Last Admin   heparin lock flush 100 unit/mL  250 Units Intracatheter PRN Celso Amy, NP       sodium chloride flush (NS) 0.9 % injection 10 mL  10 mL Intracatheter PRN Celso Amy, NP         Discharge Medications: Please see discharge summary for a list of discharge medications.  Relevant Imaging Results:  Relevant Lab Results:   Additional Information SSN [id number]  Ross Ludwig, LCSW

## 2021-01-22 NOTE — Progress Notes (Signed)
Patient is in severe pain. Last analgesic was 2142. PCP was notified.

## 2021-01-22 NOTE — Progress Notes (Signed)
[name] seems to be improving slowly but surely.  However, his hemoglobin is trending downward.  I really think he is going to have to be transfused.  I talked him about this.  He would be okay to have a transfusion.  I think we got his hemoglobin higher, he would certainly feel better and maybe this cellulitis might heal up a little bit more.  It sounds like he is going to have to go to rehab or skilled nursing once he is medically ready.  He is having no problems with pain.  He does have a little bit of nausea.  Zofran does seem to help this.  His labs show sodium 137.  Potassium 4.2.  BUN 14 creatinine 0.51.  Calcium is 7.7.  His albumin a week ago was 3.0.  It was 2.4 on 10/12.  There was a venous 5.5.  Hemoglobin 7.8.  Platelet count 236,000.  His vital signs are stable.  97.5.  Pulse 76.  Blood pressure 111/54.  Again, I think transfusion will help him out.  I will go ahead and order this.  I think that 2 units would be reasonable.  Of note, his erythropoietin level is 73.  This really is  not all that low.  [name], MD  Penelope Coop 6:2

## 2021-01-22 NOTE — Care Management Important Message (Signed)
Important Message  Patient Details IM Letter placed in Patients room. Name: [name] MRN: [id number] Date of Birth: November 01, 1945   Medicare Important Message Given:  Yes     Kerin Salen 01/22/2021, 1:04 PM

## 2021-01-22 NOTE — Progress Notes (Signed)
PT Cancellation Note  Patient Details Name: BRASON BERTHELOT MRN: [id number] DOB: 04-27-1945   Cancelled Treatment:    Reason Eval/Treat Not Completed: Fatigue/lethargy limiting ability to participate Pt receiving 2nd unit PRBCs and declines PT at this time.  Pt wishes to rest today however very agreeable to participate tomorrow.  Will check back as schedule permits.   Myrtis Hopping [place] 01/22/2021, 3:31 PM Jannette Spanner PT, DPT Acute Rehabilitation Services Pager: [phone] Office: [phone]

## 2021-01-22 NOTE — Plan of Care (Signed)
  Problem: Health Behavior/Discharge Planning: Goal: Ability to manage health-related needs will improve Outcome: Progressing   Problem: Clinical Measurements: Goal: Diagnostic test results will improve Outcome: Progressing Goal: Respiratory complications will improve Outcome: Progressing Goal: Cardiovascular complication will be avoided Outcome: Progressing   Problem: Activity: Goal: Risk for activity intolerance will decrease Outcome: Progressing   Problem: Nutrition: Goal: Adequate nutrition will be maintained Outcome: Progressing   Problem: Elimination: Goal: Will not experience complications related to bowel motility Outcome: Progressing Goal: Will not experience complications related to urinary retention Outcome: Progressing   Problem: Pain Managment: Goal: General experience of comfort will improve Outcome: Progressing   Problem: Safety: Goal: Ability to remain free from injury will improve Outcome: Progressing   Problem: Skin Integrity: Goal: Risk for impaired skin integrity will decrease Outcome: Progressing

## 2021-01-23 ENCOUNTER — Inpatient Hospital Stay: Payer: Medicare Other

## 2021-01-23 ENCOUNTER — Inpatient Hospital Stay: Payer: Medicare Other | Admitting: Hematology & Oncology

## 2021-01-23 DIAGNOSIS — I480 Paroxysmal atrial fibrillation: Secondary | ICD-10-CM | POA: Diagnosis not present

## 2021-01-23 DIAGNOSIS — F419 Anxiety disorder, unspecified: Secondary | ICD-10-CM | POA: Diagnosis not present

## 2021-01-23 DIAGNOSIS — L0390 Cellulitis, unspecified: Secondary | ICD-10-CM | POA: Diagnosis not present

## 2021-01-23 DIAGNOSIS — N179 Acute kidney failure, unspecified: Secondary | ICD-10-CM | POA: Diagnosis not present

## 2021-01-23 LAB — CBC WITH DIFFERENTIAL/PLATELET
Abs Immature Granulocytes: 0.05 10*3/uL (ref 0.00–0.07)
Basophils Absolute: 0 10*3/uL (ref 0.0–0.1)
Basophils Relative: 0 %
Eosinophils Absolute: 0 10*3/uL (ref 0.0–0.5)
Eosinophils Relative: 0 %
HCT: 33.2 % — ABNORMAL LOW (ref 39.0–52.0)
Hemoglobin: 10.3 g/dL — ABNORMAL LOW (ref 13.0–17.0)
Immature Granulocytes: 1 %
Lymphocytes Relative: 15 %
Lymphs Abs: 1.1 10*3/uL (ref 0.7–4.0)
MCH: 29.6 pg (ref 26.0–34.0)
MCHC: 31 g/dL (ref 30.0–36.0)
MCV: 95.4 fL (ref 80.0–100.0)
Monocytes Absolute: 1 10*3/uL (ref 0.1–1.0)
Monocytes Relative: 14 %
Neutro Abs: 4.9 10*3/uL (ref 1.7–7.7)
Neutrophils Relative %: 70 %
Platelets: 231 10*3/uL (ref 150–400)
RBC: 3.48 MIL/uL — ABNORMAL LOW (ref 4.22–5.81)
RDW: 16.3 % — ABNORMAL HIGH (ref 11.5–15.5)
WBC: 7 10*3/uL (ref 4.0–10.5)
nRBC: 0 % (ref 0.0–0.2)

## 2021-01-23 LAB — COMPREHENSIVE METABOLIC PANEL
ALT: 12 U/L (ref 0–44)
AST: 16 U/L (ref 15–41)
Albumin: 2.6 g/dL — ABNORMAL LOW (ref 3.5–5.0)
Alkaline Phosphatase: 32 U/L — ABNORMAL LOW (ref 38–126)
Anion gap: 5 (ref 5–15)
BUN: 17 mg/dL (ref 8–23)
CO2: 38 mmol/L — ABNORMAL HIGH (ref 22–32)
Calcium: 7.7 mg/dL — ABNORMAL LOW (ref 8.9–10.3)
Chloride: 95 mmol/L — ABNORMAL LOW (ref 98–111)
Creatinine, Ser: 0.61 mg/dL (ref 0.61–1.24)
GFR, Estimated: >60 mL/min (ref >60)
Glucose, Bld: 103 mg/dL — ABNORMAL HIGH (ref 70–99)
Potassium: 4.1 mmol/L (ref 3.5–5.1)
Sodium: 138 mmol/L (ref 135–145)
Total Bilirubin: 0.9 mg/dL (ref 0.3–1.2)
Total Protein: 4.9 g/dL — ABNORMAL LOW (ref 6.5–8.1)

## 2021-01-23 LAB — TYPE AND SCREEN
ABO/RH(D): A NEG
Antibody Screen: NEGATIVE
Unit division: 0
Unit division: 0

## 2021-01-23 LAB — BPAM RBC
Blood Product Expiration Date: 202211032359
Blood Product Expiration Date: 202211042359
ISSUE DATE / TIME: 202210171245
ISSUE DATE / TIME: 202210171447
Unit Type and Rh: 600
Unit Type and Rh: 600

## 2021-01-23 NOTE — Progress Notes (Signed)
PROGRESS NOTE    [name]  BDZ:[id number] DOB: Apr 13, 1945 DOA: 01/15/2021 PCP: Cassandria Anger, MD   No chief complaint on file.   Brief Narrative:  75 y.o. male with medical history significant of alcohol abuse, allergic rhinitis, anxiety, CAD, chronic diastolic heart failure, chronic back pain, COPD, depression, diverticulosis, history of DVT and PE, history of single episode of A. fib, hemochromatosis, GERD, frequent falls, hyperlipidemia, chronic blood loss anemia, narcotic abuse history, history of neuropathy, osteoarthritis, rheumatoid arthritis presented from SNF weakness and multiple wounds on lower extremity.  On presentation, temperature was 103.2 with tachypnea.  White count was 15.5, lactic acid was 2.6 and then 2.3, creatinine of 2.02.  He was started on broad-spectrum antibiotics.  He subsequently required BiPAP and also had to be briefly started on pressors; he was transferred to ICU and PCCM took over his care on 01/17/2021.  He is off pressors currently and care has been transferred back to [facility] on 01/18/2021.     Assessment & Plan:   Principal Problem:   Sepsis due to cellulitis [facility]) Active Problems:   Anxiety   Essential hypertension   Coronary atherosclerosis   Atrial fibrillation (HCC)   COPD (chronic obstructive pulmonary disease) (HCC)   Lumbar post-laminectomy syndrome   Pulmonary embolus (HCC)   Open thigh wound   Prolonged QT interval   Pulmonary nodules   AKI (acute kidney injury) ([place])   Sepsis ([place])   Palliative care by specialist   General weakness  1 septic shock likely secondary lower extremity cellulitis -Patient presented with criteria for sepsis with fever, tachypnea, elevated lactic acid level, leukocytosis and evidence of lower extremity cellulitis. -Cellulitis noted to be more evident on the right lower extremity. -Patient was initially on IV vancomycin and Azactam. -Azactam discontinued. -COVID-19 PCR negative. -Patient  initially was transferred to the ICU briefly and placed on pressors. -Care taken over by PCCM on 01/17/2021, was transitioned off pressors and patient transferred back to [facility] service on 01/18/2021. -Hypotension improved. -Blood cultures with no growth to date. -Urine culture with multiple species. -Was on IV fluids which have subsequently been discontinued. -Discontinued IV vancomycin and patient started on Zyvox (01/20/2021) x 5 days.  -Supportive care.  2.  Acute hypoxemic respiratory failure/COPD -Patient noted to have a worsening respiratory status after hospitalization requiring BiPAP and felt respiratory status had worsened after oral Dilaudid use. -Patient seen by PCCM. -Currently off BiPAP. -Received a dose of Lasix -Sats of 93% on 2 L nasal cannula.  3.  Acute metabolic encephalopathy -Felt likely multifactorial from acute sepsis and narcotic use. -Patient given 1 dose of Narcan on 01/16/2021 with improvement with mental status. -Oral Dilaudid resumed on 01/17/2021 per PCCM. -Patient was placed on a decreased dose of Dilaudid however it is noted per Dr.Alekh that patient was adamant he wanted to continue current dose as he been on it for 6-7 years. -Follow.  4.  AKI -Resolved with hydration.  5.  Leukocytosis -Resolved.  6.  Microcytic anemia -Vitamin B12 supplementation, folic acid. -Patient seen by hematology/oncology and status post transfusion 2 units packed red blood cells per hematology/oncology.   -Hemoglobin at 10.3 today.   -Transfusion threshold hemoglobin < 8.  -Follow H&H.  7.  Hypertension -BP soft. -Status post IV albumin.  -Blood pressure improved.  -On metoprolol.  - status post transfusion 2 units packed red blood cells.    8.  Paroxysmal atrial fibrillation -Metoprolol initially held due to soft/low blood pressure and resumed.  -  Eliquis for anticoagulation.  9.  History of PE -Eliquis.   10.  Lower extremity wounds, POA -Continue dressing  changes as recommended by wound care RN.  11.?  Pulmonary nodules on x-ray -Outpatient follow-up with PCP/pulmonary.  12.  Anxiety -Fluoxetine on hold until Zyvox has been completed and could subsequently resume after that.    -Alprazolam on hold.   13.  Generalized deconditioning -PT/OT.  14.  Recurrent melanoma/chronic pain -Being followed by oncology. -Continue current pain management. -Per oncology.  15.  Hypokalemia -Due to diuresis.  Repleted.  Potassium of 4.1.    DVT prophylaxis: Eliquis Code Status: DNR Family Communication: Updated patient.  No family at bedside. Disposition:   Status is: Inpatient  Remains inpatient appropriate because: Inpatient level of care appropriate due to severity of illness         Consultants:  PCCM Oncology Palliative care  Procedures:  Chest x-ray 01/15/2021, 01/16/2021 Upper extremity Dopplers 01/18/2021 Transfuse 2 units packed red blood cells 01/22/2021  Antimicrobials:  IV aztreonam 01/15/2021>>> 01/18/2021 IV Flagyl 1010 2022x1 dose IV vancomycin 01/15/2021>>>>> 01/19/2021 Oral Zyvox 01/20/2021>>>>> 01/25/2021     Subjective: Sleeping but arousable.  Denies any nausea or emesis.  No chest pain.  No shortness of breath.  States feeling better after transfusion of 2 units packed red blood cells yesterday.   Objective: Vitals:   01/23/21 0500 01/23/21 0802 01/23/21 0825 01/23/21 1020  BP:  121/65  124/61  Pulse:  81  84  Resp:    18  Temp:  98.2 F (36.8 C)  98.3 F (36.8 C)  TempSrc:  Oral  Oral  SpO2:  98% 93%   Weight: 69.5 kg     Height:        Intake/Output Summary (Last 24 hours) at 01/23/2021 1238 Last data filed at 01/23/2021 1038 Gross per 24 hour  Intake 1400.33 ml  Output 4075 ml  Net -2674.67 ml    Filed Weights   01/18/21 0458 01/18/21 1228 01/23/21 0500  Weight: 71.2 kg 72.8 kg 69.5 kg    Examination:  General exam: NAD Respiratory system: Some coarse breath sounds anterior  lung fields otherwise clear.  No wheezes, no crackles.no use of accessory muscles of respiration.  Normal respiratory effort.   Cardiovascular system: RRR no murmurs rubs or gallops.  No JVD.  Trace right lower extremity edema. Gastrointestinal system: Abdomen is soft, nontender, nondistended, positive bowel sounds.  No rebound.  No guarding. GU: Improved penile/scrotal swelling.  Central nervous system: Alert and oriented. No focal neurological deficits. Extremities: Trace mild right lower extremity edema. Skin: Bilateral lower extremity chronic skin changes with erythema and tenderness with wounds in the left thigh with dressing and right lower extremity skin tear.  Right lower extremity with improving erythema.  No significant ecchymosis. Psychiatry: Judgement and insight appear normal. Mood & affect appropriate.     Data Reviewed: I have personally reviewed following labs and imaging studies  CBC: Recent Labs  Lab 01/19/21 0336 01/20/21 0339 01/21/21 0132 01/22/21 0325 01/23/21 0425  WBC 7.9 6.8 6.7 5.5 7.0  NEUTROABS 6.2 5.2 5.0 3.8 4.9  HGB 8.5* 8.0* 8.2* 7.8* 10.3*  HCT 29.3* 26.8* 26.4* 26.6* 33.2*  MCV 101.4* 100.4* 98.1 99.3 95.4  PLT 181 210 216 236 231     Basic Metabolic Panel: Recent Labs  Lab 01/17/21 0219 01/18/21 0345 01/19/21 0336 01/20/21 0339 01/21/21 0132 01/22/21 0325 01/23/21 0425  NA 142 142 138 139 137 137 138  K 3.4* 3.6  4.0 3.5 3.2* 4.2 4.1  CL 105 109 102 103 99 100 95*  CO2 29 29 30  33* 34* 34* 38*  GLUCOSE 174* 196* 98 87 101* 92 103*  BUN 34* 27* 29* 21 18 14 17   CREATININE 0.86 0.61 0.62 0.51* 0.48* 0.51* 0.61  CALCIUM 7.6* 8.1* 8.1* 7.7* 7.9* 7.7* 7.7*  MG 2.3 2.4 2.3  --   --   --   --   PHOS  --  1.9*  --  2.8  --   --   --      GFR: Estimated Creatinine Clearance: 74.2 mL/min (by C-G formula based on SCr of 0.61 mg/dL).  Liver Function Tests: Recent Labs  Lab 01/17/21 0219 01/23/21 0425  AST 21 16  ALT 16 12  ALKPHOS  40 32*  BILITOT 1.2 0.9  PROT 4.7* 4.9*  ALBUMIN 2.4* 2.6*     CBG: No results for input(s): GLUCAP in the last 168 hours.   Recent Results (from the past 240 hour(s))  Culture, blood (Routine x 2)     Status: None   Collection Time: 01/15/21 10:57 AM   Specimen: BLOOD  Result Value Ref Range Status   Specimen Description   Final    BLOOD LEFT ANTECUBITAL Performed at [facility] [place] [address] [place], [place] [address]    Special Requests   Final    BOTTLES DRAWN AEROBIC AND ANAEROBIC Blood Culture adequate volume Performed at [place] [address], [place], [place] [address]    Culture   Final    NO GROWTH 5 DAYS Performed at [facility], [place] [address], [place], [place] [address]    Report Status 01/20/2021 FINAL  Final  Culture, blood (Routine x 2)     Status: None   Collection Time: 01/15/21 12:31 PM   Specimen: BLOOD  Result Value Ref Range Status   Specimen Description   Final    BLOOD SITE NOT SPECIFIED Performed at [place] [address] [place], [place] [address]    Special Requests   Final    BOTTLES DRAWN AEROBIC AND ANAEROBIC Blood Culture adequate volume Performed at [place] [address], [place], [place] [address]    Culture   Final    NO GROWTH 5 DAYS Performed at [facility], [place] [address], [place], [place] [address]    Report Status 01/20/2021 FINAL  Final  Resp Panel by RT-PCR (Flu A&B, Covid)     Status: None   Collection Time: 01/15/21 12:31 PM   Specimen: Nasopharyngeal(NP) swabs in vial transport medium  Result Value Ref Range Status   SARS Coronavirus 2 by RT PCR NEGATIVE NEGATIVE Final    Comment: (NOTE) SARS-CoV-2 target nucleic acids are NOT DETECTED.  The SARS-CoV-2 RNA is generally detectable in upper respiratory specimens during the acute phase of infection. The lowest concentration of SARS-CoV-2 viral copies this  assay can detect is 138 copies/mL. A negative result does not preclude SARS-Cov-2 infection and should not be used as the sole basis for treatment or other patient management decisions. A negative result may occur with  improper specimen collection/handling, submission of specimen other than nasopharyngeal swab, presence of viral mutation(s) within the areas targeted by this assay, and inadequate number of viral copies(<138 copies/mL). A negative result must be combined with clinical observations, patient history, and epidemiological information. The expected result is Negative.  Fact Sheet for Patients:  EntrepreneurPulse.com.au  Fact Sheet for Healthcare Providers:  IncredibleEmployment.be  This test is no t yet approved or cleared by the [place] FDA and  has been authorized for detection and/or diagnosis of SARS-CoV-2 by FDA under an Emergency Use Authorization (EUA). This EUA will remain  in effect (meaning this test can be used) for the duration of the COVID-19 declaration under Section 564(b)(1) of the Act, 21 U.S.C.section 360bbb-3(b)(1), unless the authorization is terminated  or revoked sooner.       Influenza A by PCR NEGATIVE NEGATIVE Final   Influenza B by PCR NEGATIVE NEGATIVE Final    Comment: (NOTE) The Xpert Xpress SARS-CoV-2/FLU/RSV plus assay is intended as an aid in the diagnosis of influenza from Nasopharyngeal swab specimens and should not be used as a sole basis for treatment. Nasal washings and aspirates are unacceptable for Xpert Xpress SARS-CoV-2/FLU/RSV testing.  Fact Sheet for Patients: EntrepreneurPulse.com.au  Fact Sheet for Healthcare Providers: IncredibleEmployment.be  This test is not yet approved or cleared by the [place] FDA and has been authorized for detection and/or diagnosis of SARS-CoV-2 by FDA under an Emergency Use Authorization (EUA). This EUA will  remain in effect (meaning this test can be used) for the duration of the COVID-19 declaration under Section 564(b)(1) of the Act, 21 U.S.C. section 360bbb-3(b)(1), unless the authorization is terminated or revoked.  Performed at [facility], [place] [address], [place], [place] [address]   Urine Culture     Status: Abnormal   Collection Time: 01/15/21 11:00 PM   Specimen: In/Out Cath Urine  Result Value Ref Range Status   Specimen Description   Final    IN/OUT CATH URINE Performed at [place] [address], [place], [place] [address]    Special Requests   Final    NONE Performed at [facility], [place] [address], [place], [place] [address]    Culture MULTIPLE SPECIES PRESENT, SUGGEST RECOLLECTION (A)  Final   Report Status 01/16/2021 FINAL  Final  MRSA Next Gen by PCR, Nasal     Status: None   Collection Time: 01/16/21  6:05 PM   Specimen: Nasal Mucosa; Nasal Swab  Result Value Ref Range Status   MRSA by PCR Next Gen NOT DETECTED NOT DETECTED Final    Comment: (NOTE) The GeneXpert MRSA Assay (FDA approved for NASAL specimens only), is one component of a comprehensive MRSA colonization surveillance program. It is not intended to diagnose MRSA infection nor to guide or monitor treatment for MRSA infections. Test performance is not FDA approved in patients less than 75 years old. Performed at [facility], [place] [address] [place], [place] [address]           Radiology Studies: No results found.      Scheduled Meds:  (feeding supplement) PROSource Plus  30 mL Oral BID BM   apixaban  5 mg Oral BID   chlorhexidine  15 mL Mouth Rinse BID   Chlorhexidine Gluconate Cloth  6 each Topical Daily   feeding supplement  237 mL Oral BID BM   folic acid  1 mg Oral Daily   gabapentin  600 mg Oral QHS   Gerhardt's butt cream   Topical BID   ipratropium-albuterol  3 mL Nebulization TID   linezolid   600 mg Oral Q12H   mouth rinse  15 mL Mouth Rinse q12n4p   melatonin  3 mg Oral QHS   metoprolol tartrate  25 mg Oral BID  mometasone-formoterol  2 puff Inhalation BID   multivitamin with minerals  1 tablet Oral Daily   nutrition supplement (JUVEN)  1 packet Oral BID BM   pantoprazole  40 mg Oral QPM   pyridOXINE  50 mg Oral Daily   senna-docusate  1 tablet Oral BID   tamsulosin  0.4 mg Oral Daily   vitamin B-12  1,000 mcg Oral Daily   Continuous Infusions:  sodium chloride Stopped (01/20/21 2041)     LOS: 8 days    Time spent: 35 minutes    Irine Seal, MD [facility]   To contact the attending provider between 7A-7P or the covering provider during after hours 7P-7A, please log into the web site [email or web address] and access using universal [place] password for that web site. If you do not have the password, please call the hospital operator.  01/23/2021, 12:38 PM

## 2021-01-23 NOTE — Plan of Care (Signed)
  Problem: Elimination: Goal: Will not experience complications related to urinary retention Outcome: Progressing   Problem: Elimination: Goal: Will not experience complications related to urinary retention Outcome: Progressing

## 2021-01-23 NOTE — Progress Notes (Signed)
   01/23/21 1547  Assess: MEWS Score  Temp (!) 100.7 F (38.2 C)  BP 107/65  Pulse Rate (!) 102  Resp (!) 26  Level of Consciousness Alert  SpO2 90 %  O2 Device Nasal Cannula  O2 Flow Rate (L/min) 2 L/min  Assess: MEWS Score  MEWS Temp 1  MEWS Systolic 0  MEWS Pulse 1  MEWS RR 2  MEWS LOC 0  MEWS Score 4  MEWS Score Color Red  Assess: if the MEWS score is Yellow or Red  Were vital signs taken at a resting state? Yes  Focused Assessment Change from prior assessment (see assessment flowsheet)  Does the patient meet 2 or more of the SIRS criteria? Yes  Does the patient have a confirmed or suspected source of infection? Yes  Provider and Rapid Response Notified? Yes  MEWS guidelines implemented *See Row Information* Yes  Treat  MEWS Interventions Administered prn meds/treatments;Escalated (See documentation below)  Pain Scale Faces  Faces Pain Scale 10  Take Vital Signs  Increase Vital Sign Frequency  Red: Q 1hr X 4 then Q 4hr X 4, if remains red, continue Q 4hrs  Escalate  MEWS: Escalate Red: discuss with charge nurse/RN and provider, consider discussing with RRT  Notify: Charge Nurse/RN  Name of Charge Nurse/RN Notified Megan Bullins RN  Date Charge Nurse/RN Notified 01/23/21  Time Charge Nurse/RN Notified 9574  Notify: Provider  Provider Name/Title Dr Grandville Silos  Date Provider Notified 01/23/21  Time Provider Notified 1649  Notification Type Page  Notification Reason Other (Comment) (Red MEWS)  Notify: Rapid Response  Name of Rapid Response RN Notified Zoe Osipenko, RN  Date Rapid Response Notified 01/23/21  Time Rapid Response Notified 1627  Assess: SIRS CRITERIA  SIRS Temperature  0  SIRS Pulse 1  SIRS Respirations  1  SIRS WBC 0  SIRS Score Sum  2

## 2021-01-23 NOTE — TOC Progression Note (Signed)
Transition of Care [facility]) - Progression Note    Patient Details  Name: [name] MRN: [id number] Date of Birth: Nov 01, 1945  Transition of Care [facility]) CM/SW Contact  Leeroy Cha, RN Phone Number: 01/23/2021, 2:58 PM  Clinical Narrative:    Spoke with patient presented snf offers.  Has chosen [facility].   Expected Discharge Plan: [place] Barriers to Discharge: Continued Medical Work up  Expected Discharge Plan and Services Expected Discharge Plan: [place] In-house Referral: Clinical Social Work     Living arrangements for the past 2 months: [place]                 DME Arranged: N/A DME Agency: NA                   Social Determinants of Health (SDOH) Interventions    Readmission Risk Interventions Readmission Risk Prevention Plan 01/17/2021  Transportation Screening Complete  HRI or Home Care Consult Complete  SW Recovery Care/Counseling Consult Complete  Palliative Care Screening (No Data)  Some recent data might be hidden

## 2021-01-23 NOTE — Plan of Care (Signed)
  Problem: Clinical Measurements: Goal: Diagnostic test results will improve Outcome: Progressing Goal: Respiratory complications will improve Outcome: Progressing Goal: Cardiovascular complication will be avoided Outcome: Progressing   Problem: Nutrition: Goal: Adequate nutrition will be maintained Outcome: Progressing   Problem: Pain Managment: Goal: General experience of comfort will improve Outcome: Progressing

## 2021-01-23 NOTE — Plan of Care (Signed)
  Problem: Elimination: Goal: Will not experience complications related to urinary retention Outcome: Progressing   Problem: Pain Managment: Goal: General experience of comfort will improve Outcome: Progressing

## 2021-01-24 DIAGNOSIS — R2689 Other abnormalities of gait and mobility: Secondary | ICD-10-CM | POA: Diagnosis not present

## 2021-01-24 DIAGNOSIS — L0390 Cellulitis, unspecified: Secondary | ICD-10-CM | POA: Diagnosis not present

## 2021-01-24 DIAGNOSIS — R262 Difficulty in walking, not elsewhere classified: Secondary | ICD-10-CM | POA: Diagnosis not present

## 2021-01-24 DIAGNOSIS — R5381 Other malaise: Secondary | ICD-10-CM | POA: Diagnosis not present

## 2021-01-24 DIAGNOSIS — B974 Respiratory syncytial virus as the cause of diseases classified elsewhere: Secondary | ICD-10-CM | POA: Diagnosis present

## 2021-01-24 DIAGNOSIS — I959 Hypotension, unspecified: Secondary | ICD-10-CM | POA: Diagnosis not present

## 2021-01-24 DIAGNOSIS — Z66 Do not resuscitate: Secondary | ICD-10-CM | POA: Diagnosis present

## 2021-01-24 DIAGNOSIS — I509 Heart failure, unspecified: Secondary | ICD-10-CM | POA: Diagnosis present

## 2021-01-24 DIAGNOSIS — C7800 Secondary malignant neoplasm of unspecified lung: Secondary | ICD-10-CM | POA: Diagnosis not present

## 2021-01-24 DIAGNOSIS — R1312 Dysphagia, oropharyngeal phase: Secondary | ICD-10-CM | POA: Diagnosis not present

## 2021-01-24 DIAGNOSIS — R001 Bradycardia, unspecified: Secondary | ICD-10-CM | POA: Diagnosis not present

## 2021-01-24 DIAGNOSIS — J9600 Acute respiratory failure, unspecified whether with hypoxia or hypercapnia: Secondary | ICD-10-CM | POA: Diagnosis not present

## 2021-01-24 DIAGNOSIS — R911 Solitary pulmonary nodule: Secondary | ICD-10-CM | POA: Diagnosis not present

## 2021-01-24 DIAGNOSIS — Z9181 History of falling: Secondary | ICD-10-CM | POA: Diagnosis not present

## 2021-01-24 DIAGNOSIS — G9341 Metabolic encephalopathy: Secondary | ICD-10-CM | POA: Diagnosis not present

## 2021-01-24 DIAGNOSIS — K219 Gastro-esophageal reflux disease without esophagitis: Secondary | ICD-10-CM | POA: Diagnosis present

## 2021-01-24 DIAGNOSIS — J441 Chronic obstructive pulmonary disease with (acute) exacerbation: Secondary | ICD-10-CM | POA: Diagnosis not present

## 2021-01-24 DIAGNOSIS — R2681 Unsteadiness on feet: Secondary | ICD-10-CM | POA: Diagnosis not present

## 2021-01-24 DIAGNOSIS — I480 Paroxysmal atrial fibrillation: Secondary | ICD-10-CM | POA: Diagnosis present

## 2021-01-24 DIAGNOSIS — L89302 Pressure ulcer of unspecified buttock, stage 2: Secondary | ICD-10-CM | POA: Diagnosis present

## 2021-01-24 DIAGNOSIS — N179 Acute kidney failure, unspecified: Secondary | ICD-10-CM | POA: Diagnosis not present

## 2021-01-24 DIAGNOSIS — A419 Sepsis, unspecified organism: Secondary | ICD-10-CM | POA: Diagnosis not present

## 2021-01-24 DIAGNOSIS — C439 Malignant melanoma of skin, unspecified: Secondary | ICD-10-CM | POA: Diagnosis not present

## 2021-01-24 DIAGNOSIS — E8809 Other disorders of plasma-protein metabolism, not elsewhere classified: Secondary | ICD-10-CM | POA: Diagnosis present

## 2021-01-24 DIAGNOSIS — J9621 Acute and chronic respiratory failure with hypoxia: Secondary | ICD-10-CM | POA: Diagnosis present

## 2021-01-24 DIAGNOSIS — Z7189 Other specified counseling: Secondary | ICD-10-CM | POA: Diagnosis not present

## 2021-01-24 DIAGNOSIS — G893 Neoplasm related pain (acute) (chronic): Secondary | ICD-10-CM | POA: Diagnosis not present

## 2021-01-24 DIAGNOSIS — J45901 Unspecified asthma with (acute) exacerbation: Secondary | ICD-10-CM | POA: Diagnosis present

## 2021-01-24 DIAGNOSIS — R41841 Cognitive communication deficit: Secondary | ICD-10-CM | POA: Diagnosis not present

## 2021-01-24 DIAGNOSIS — L03116 Cellulitis of left lower limb: Secondary | ICD-10-CM | POA: Diagnosis not present

## 2021-01-24 DIAGNOSIS — I10 Essential (primary) hypertension: Secondary | ICD-10-CM | POA: Diagnosis not present

## 2021-01-24 DIAGNOSIS — J449 Chronic obstructive pulmonary disease, unspecified: Secondary | ICD-10-CM | POA: Diagnosis not present

## 2021-01-24 DIAGNOSIS — C7989 Secondary malignant neoplasm of other specified sites: Secondary | ICD-10-CM | POA: Diagnosis not present

## 2021-01-24 DIAGNOSIS — F419 Anxiety disorder, unspecified: Secondary | ICD-10-CM | POA: Diagnosis present

## 2021-01-24 DIAGNOSIS — Z515 Encounter for palliative care: Secondary | ICD-10-CM | POA: Diagnosis not present

## 2021-01-24 DIAGNOSIS — C4922 Malignant neoplasm of connective and soft tissue of left lower limb, including hip: Secondary | ICD-10-CM | POA: Diagnosis not present

## 2021-01-24 DIAGNOSIS — R059 Cough, unspecified: Secondary | ICD-10-CM | POA: Diagnosis not present

## 2021-01-24 DIAGNOSIS — C4372 Malignant melanoma of left lower limb, including hip: Secondary | ICD-10-CM | POA: Diagnosis present

## 2021-01-24 DIAGNOSIS — R0602 Shortness of breath: Secondary | ICD-10-CM | POA: Diagnosis not present

## 2021-01-24 DIAGNOSIS — M069 Rheumatoid arthritis, unspecified: Secondary | ICD-10-CM | POA: Diagnosis present

## 2021-01-24 DIAGNOSIS — Z923 Personal history of irradiation: Secondary | ICD-10-CM | POA: Diagnosis not present

## 2021-01-24 DIAGNOSIS — C7802 Secondary malignant neoplasm of left lung: Secondary | ICD-10-CM | POA: Diagnosis present

## 2021-01-24 DIAGNOSIS — E43 Unspecified severe protein-calorie malnutrition: Secondary | ICD-10-CM | POA: Diagnosis not present

## 2021-01-24 DIAGNOSIS — L03115 Cellulitis of right lower limb: Secondary | ICD-10-CM | POA: Diagnosis not present

## 2021-01-24 DIAGNOSIS — I2510 Atherosclerotic heart disease of native coronary artery without angina pectoris: Secondary | ICD-10-CM | POA: Diagnosis present

## 2021-01-24 DIAGNOSIS — C7801 Secondary malignant neoplasm of right lung: Secondary | ICD-10-CM | POA: Diagnosis present

## 2021-01-24 DIAGNOSIS — Z955 Presence of coronary angioplasty implant and graft: Secondary | ICD-10-CM | POA: Diagnosis not present

## 2021-01-24 DIAGNOSIS — G894 Chronic pain syndrome: Secondary | ICD-10-CM | POA: Diagnosis not present

## 2021-01-24 DIAGNOSIS — L03114 Cellulitis of left upper limb: Secondary | ICD-10-CM | POA: Diagnosis present

## 2021-01-24 DIAGNOSIS — J9811 Atelectasis: Secondary | ICD-10-CM | POA: Diagnosis not present

## 2021-01-24 DIAGNOSIS — J9601 Acute respiratory failure with hypoxia: Secondary | ICD-10-CM | POA: Diagnosis not present

## 2021-01-24 DIAGNOSIS — Z20822 Contact with and (suspected) exposure to covid-19: Secondary | ICD-10-CM | POA: Diagnosis present

## 2021-01-24 DIAGNOSIS — D649 Anemia, unspecified: Secondary | ICD-10-CM | POA: Diagnosis not present

## 2021-01-24 DIAGNOSIS — I69891 Dysphagia following other cerebrovascular disease: Secondary | ICD-10-CM | POA: Diagnosis not present

## 2021-01-24 DIAGNOSIS — M6281 Muscle weakness (generalized): Secondary | ICD-10-CM | POA: Diagnosis not present

## 2021-01-24 DIAGNOSIS — R0902 Hypoxemia: Secondary | ICD-10-CM | POA: Diagnosis not present

## 2021-01-24 DIAGNOSIS — I110 Hypertensive heart disease with heart failure: Secondary | ICD-10-CM | POA: Diagnosis present

## 2021-01-24 DIAGNOSIS — M05711 Rheumatoid arthritis with rheumatoid factor of right shoulder without organ or systems involvement: Secondary | ICD-10-CM | POA: Diagnosis not present

## 2021-01-24 DIAGNOSIS — I4510 Unspecified right bundle-branch block: Secondary | ICD-10-CM | POA: Diagnosis not present

## 2021-01-24 DIAGNOSIS — I69828 Other speech and language deficits following other cerebrovascular disease: Secondary | ICD-10-CM | POA: Diagnosis not present

## 2021-01-24 DIAGNOSIS — L89153 Pressure ulcer of sacral region, stage 3: Secondary | ICD-10-CM | POA: Diagnosis not present

## 2021-01-24 DIAGNOSIS — R600 Localized edema: Secondary | ICD-10-CM | POA: Diagnosis not present

## 2021-01-24 DIAGNOSIS — I252 Old myocardial infarction: Secondary | ICD-10-CM | POA: Diagnosis not present

## 2021-01-24 DIAGNOSIS — Z86718 Personal history of other venous thrombosis and embolism: Secondary | ICD-10-CM | POA: Diagnosis not present

## 2021-01-24 LAB — CBC
HCT: 34.3 % — ABNORMAL LOW (ref 39.0–52.0)
Hemoglobin: 10.5 g/dL — ABNORMAL LOW (ref 13.0–17.0)
MCH: 29.6 pg (ref 26.0–34.0)
MCHC: 30.6 g/dL (ref 30.0–36.0)
MCV: 96.6 fL (ref 80.0–100.0)
Platelets: 220 10*3/uL (ref 150–400)
RBC: 3.55 MIL/uL — ABNORMAL LOW (ref 4.22–5.81)
RDW: 15.5 % (ref 11.5–15.5)
WBC: 10.7 10*3/uL — ABNORMAL HIGH (ref 4.0–10.5)
nRBC: 0 % (ref 0.0–0.2)

## 2021-01-24 LAB — COMPREHENSIVE METABOLIC PANEL
ALT: 12 U/L (ref 0–44)
AST: 15 U/L (ref 15–41)
Albumin: 2.5 g/dL — ABNORMAL LOW (ref 3.5–5.0)
Alkaline Phosphatase: 37 U/L — ABNORMAL LOW (ref 38–126)
Anion gap: 7 (ref 5–15)
BUN: 24 mg/dL — ABNORMAL HIGH (ref 8–23)
CO2: 34 mmol/L — ABNORMAL HIGH (ref 22–32)
Calcium: 7.8 mg/dL — ABNORMAL LOW (ref 8.9–10.3)
Chloride: 95 mmol/L — ABNORMAL LOW (ref 98–111)
Creatinine, Ser: 0.58 mg/dL — ABNORMAL LOW (ref 0.61–1.24)
GFR, Estimated: >60 mL/min (ref >60)
Glucose, Bld: 121 mg/dL — ABNORMAL HIGH (ref 70–99)
Potassium: 4.3 mmol/L (ref 3.5–5.1)
Sodium: 136 mmol/L (ref 135–145)
Total Bilirubin: 0.8 mg/dL (ref 0.3–1.2)
Total Protein: 5 g/dL — ABNORMAL LOW (ref 6.5–8.1)

## 2021-01-24 LAB — MAGNESIUM: Magnesium: 2 mg/dL (ref 1.7–2.4)

## 2021-01-24 MED ORDER — TAMSULOSIN HCL 0.4 MG PO CAPS: 0.4000 mg | ORAL_CAPSULE | Freq: Every day | ORAL | 0 refills | Status: AC

## 2021-01-24 MED ORDER — LINEZOLID 600 MG PO TABS: 600.0000 mg | ORAL_TABLET | Freq: Two times a day (BID) | ORAL | 0 refills | Status: AC

## 2021-01-24 MED ORDER — HEPARIN SOD (PORK) LOCK FLUSH 100 UNIT/ML IV SOLN
500.0000 [IU] | INTRAVENOUS | Status: AC | PRN
  Administered 2021-01-24: 500 [IU]
  Filled 2021-01-24: qty 5

## 2021-01-24 MED ORDER — MELATONIN 3 MG PO TABS: 3.0000 mg | ORAL_TABLET | Freq: Every day | ORAL | 0 refills | Status: AC

## 2021-01-24 MED ORDER — HYDROMORPHONE HCL 2 MG PO TABS: 2.0000 mg | ORAL_TABLET | Freq: Three times a day (TID) | ORAL | 0 refills | Status: AC | PRN

## 2021-01-24 MED ORDER — ALPRAZOLAM 0.5 MG PO TABS: 0.5000 mg | ORAL_TABLET | Freq: Three times a day (TID) | ORAL | 1 refills | Status: DC | PRN

## 2021-01-24 NOTE — Progress Notes (Signed)
It sounds like he had a bad day yesterday.  Not sure exactly what happened.  He had a temperature.  He said he hurt all over.  I am unsure if this is a flareup of the rheumatism.  He feels better this morning.  It sounds like he will be going to [facility].  I am unsure when he will be going.  His labs show white count 10.7.  Hemoglobin 10.5.  Platelet count 220,000.  His BUN is 24 creatinine 0.58.  Calcium is 7.8 with an albumin of 2.5.  There is been no bleeding.  He has had no diarrhea.  He has had no mouth sores.  Vital signs show temperature of 98.  Pulse 79.  Blood pressure 129/69.  Oxygen saturation 94%.  His lungs sound clear bilaterally.  Cardiac exam regular rate and rhythm.  He has no murmurs.  Abdomen is soft.  He has decent bowel sounds.  There is no fluid wave.  Extremities shows the dressings on his legs.  He has little bit of edema but this appears to be better.  Neurological exam is nonfocal.  Again, sure as to what happened yesterday.  He seems pretty well this morning.  He seems like he is at his baseline.  I am not sure how much she is eating.  I think this will be helpful.  I appreciate the great care he is getting from the staff on 4 W.  I do not think he needs to have the cardiac monitor right now.  [name], MD  Oswaldo Milian 35:4

## 2021-01-24 NOTE — TOC Transition Note (Addendum)
Transition of Care [facility]) - CM/SW Discharge Note   Patient Details  Name: [name] MRN: [id number] Date of Birth: 1945-12-21  Transition of Care [facility]) CM/SW Contact:  Leeroy Cha, RN Phone Number: 01/24/2021, 1:46 PM   Clinical Narrative:    Patient is stating that he does not want to go today but tomorrow.  He felt badly on [id number].  Feels fine today [id number].  Explained to patient that he has been dcd and will need to go to [facility] as not to lose the bed and that medicare will not pay for a day that is not medically necessary for him to stay in an acute care hospital.  He then stated that he will send a legal letter stating that we made him go. And that he was not told that he was going until the ambulance showed up.  Patient was told  on Tuesday that he would be going to [facility] . He chose this facility.  Dr. Earlie Counts notified.  No medical reason for he to stay. TCT-SIGN OTHER IN CHART WRONG IS INCORRECT.   Tct-son message left on voice mail that father would be being moved to [facility] this afternoon. [facility] called for transport at 1405. Transfer packet given to ward clerk Ron. Final next level of care: Assisted Living Barriers to Discharge: Continued Medical Work up   Patient Goals and CMS Choice Patient states their goals for this hospitalization and ongoing recovery are:: Return home if possible CMS Medicare.gov Compare Post Acute Care list provided to:: Patient Represenative (must comment) Choice offered to / list presented to : Spouse  Discharge Placement                       Discharge Plan and Services In-house Referral: Clinical Social Work              DME Arranged: N/A DME Agency: NA                  Social Determinants of Health ([place]) Interventions     Readmission Risk Interventions Readmission Risk Prevention Plan 01/17/2021  Transportation Screening Complete  HRI or Home Care Consult Complete  SW Recovery Care/Counseling  Consult Complete  Palliative Care Screening (No Data)  Some recent data might be hidden

## 2021-01-24 NOTE — Progress Notes (Signed)
Report called to [facility], IV removal well tolerated, patients belongings packed and patient dressed awaiting transport. Discharge packet at the nurses station. [facility] called, stated patient next for pick up @18 :40 pm.

## 2021-01-24 NOTE — Progress Notes (Signed)
Physical Therapy Treatment Patient Details Name: [name] MRN: [id number] DOB: [birth year] Today's Date: 01/24/2021   History of Present Illness Patient 75 year old man history of malignant melanoma L thigh, paroxysmal A. fib, RA, COPD, HTN,hemochromatosis, chronic back pain, anemia, anxiety, falls, ETOH abuse presented  01/15/21 with sepsis, L LE cellulitis, dehydration. Required Bipap and pressors.    PT Comments    Pt reports feeling much better today and agreeable to mobilize.  Pt ambulated in hallway 60 feet and remained on 2L O2 [place].  Pt would benefit from SNF upon d/c to improve strength, endurance, and independence.    Recommendations for follow up therapy are one component of a multi-disciplinary discharge planning process, led by the attending physician.  Recommendations may be updated based on patient status, additional functional criteria and insurance authorization.  Follow Up Recommendations  SNF     Equipment Recommendations  None recommended by PT    Recommendations for Other Services       Precautions / Restrictions Precautions Precautions: Fall Precaution Comments: monitor sats     Mobility  Bed Mobility Overal bed mobility: Needs Assistance Bed Mobility: Supine to Sit     Supine to sit: Min guard;HOB elevated     General bed mobility comments: pt required UE assist and utilized rails    Transfers Overall transfer level: Needs assistance Equipment used: Rolling walker (2 wheeled) Transfers: Sit to/from Stand Sit to Stand: Min assist;From elevated surface         General transfer comment: assist to rise and steady  Ambulation/Gait Ambulation/Gait assistance: Min guard;Min assist Gait Distance (Feet): 60 Feet Assistive device: Rolling walker (2 wheeled) Gait Pattern/deviations: Step-through pattern;Decreased stride length;Trunk flexed     General Gait Details: cues for posture, distance to tolerance, pt reports mild dyspnea,  remained on 2L O2 [place] per pt preference, pt mostly reports UE fatigue/pain in hands   Stairs             Wheelchair Mobility    Modified Rankin (Stroke Patients Only)       Balance                                            Cognition Arousal/Alertness: Awake/alert Behavior During Therapy: WFL for tasks assessed/performed Overall Cognitive Status: Within Functional Limits for tasks assessed                                        Exercises      General Comments        Pertinent Vitals/Pain Pain Assessment: Faces Faces Pain Scale: Hurts even more Pain Location: hands Pain Descriptors / Indicators: Sore;Tender Pain Intervention(s): Monitored during session;Repositioned    Home Living                      Prior Function            PT Goals (current goals can now be found in the care plan section) Progress towards PT goals: Progressing toward goals    Frequency    Min 2X/week      PT Plan Current plan remains appropriate    Co-evaluation              AM-PAC PT "6 Clicks" Mobility   Outcome Measure  Help needed turning from your back to your side while in a flat bed without using bedrails?: A Little Help needed moving from lying on your back to sitting on the side of a flat bed without using bedrails?: A Little Help needed moving to and from a bed to a chair (including a wheelchair)?: A Little Help needed standing up from a chair using your arms (e.g., wheelchair or bedside chair)?: A Little Help needed to walk in hospital room?: A Little Help needed climbing 3-5 steps with a railing? : A Lot 6 Click Score: 17    End of Session Equipment Utilized During Treatment: Gait belt;Oxygen Activity Tolerance: Patient tolerated treatment well Patient left: in chair;with call bell/phone within reach;with chair alarm set Nurse Communication: Mobility status PT Visit Diagnosis: Difficulty in walking, not  elsewhere classified (R26.2);History of falling (Z91.81)     Time: 1749-4496 PT Time Calculation (min) (ACUTE ONLY): 17 min  Charges:  $Gait Training: 8-22 mins                    Jannette Spanner PT, DPT Acute Rehabilitation Services Pager: [phone] Office: [place] 01/24/2021, 12:50 PM

## 2021-01-24 NOTE — Progress Notes (Signed)
Occupational Therapy Treatment Patient Details Name: [name] MRN: [id number] DOB: 12-08-1945 Today's Date: 01/24/2021   History of present illness Patient 75 year old man history of malignant melanoma L thigh, paroxysmal A. fib, RA, COPD, HTN,hemochromatosis, chronic back pain, anemia, anxiety, falls, ETOH abuse presented  01/15/21 with sepsis, L LE cellulitis, dehydration. Required Bipap and pressors.   OT comments  Patients session was limited today with patient having just returned to bed and hearing he was transitioning to SNF on this date. Patient was educated on compensatory strategies for taking sips from cups with pain and edema in BUE. Patient was educated on pillow placement to reduce edema. Patient was able to educate therapist on how to place pillows to educated SNF. Patient's discharge plan remains appropriate at this time. OT will continue to follow acutely.     Recommendations for follow up therapy are one component of a multi-disciplinary discharge planning process, led by the attending physician.  Recommendations may be updated based on patient status, additional functional criteria and insurance authorization.    Follow Up Recommendations  SNF    Equipment Recommendations  None recommended by OT    Recommendations for Other Services      Precautions / Restrictions Precautions Precautions: Fall Precaution Comments: monitor sats Restrictions Weight Bearing Restrictions: No       Mobility Bed Mobility Overal bed mobility: Needs Assistance Bed Mobility: Supine to Sit     Supine to sit: Min guard;HOB elevated     General bed mobility comments: pt required UE assist and utilized rails    Transfers Overall transfer level: Needs assistance Equipment used: Rolling walker (2 wheeled) Transfers: Sit to/from Stand Sit to Stand: Min assist;From elevated surface         General transfer comment: assist to rise and steady    Balance                                            ADL either performed or assessed with clinical judgement   ADL Overall ADL's : Needs assistance/impaired Eating/Feeding: Set up;Sitting Eating/Feeding Details (indicate cue type and reason): patient had just gotten back to bed with nursing with patient reported increased edema in BUE and pain impacting aiblity to engage in taking drinks from cup. patient was educated on variou different styles of cups here with patient declining to trial them stating they are not his ideal cup though there are qualities  that patient would benefit from. patient was positioned in bed with pillows under BUE to further support UEs and reduce risks of edema build up while resting. patient verbliaed how to educate SNF staff to position pillows approprietly. patient tolerated session well.                                         Vision Patient Visual Report: No change from baseline     Perception     Praxis      Cognition Arousal/Alertness: Awake/alert Behavior During Therapy: WFL for tasks assessed/performed Overall Cognitive Status: Within Functional Limits for tasks assessed  Exercises     Shoulder Instructions       General Comments      Pertinent Vitals/ Pain       Pain Assessment: Faces Faces Pain Scale: Hurts little more Pain Location: hands Pain Descriptors / Indicators: Sore;Tender Pain Intervention(s): Limited activity within patient's tolerance;Repositioned  Home Living                                          Prior Functioning/Environment              Frequency  Min 2X/week        Progress Toward Goals  OT Goals(current goals can now be found in the care plan section)  Progress towards OT goals: Progressing toward goals  Acute Rehab OT Goals Patient Stated Goal: return home  Plan Discharge plan remains appropriate     Co-evaluation                 AM-PAC OT "6 Clicks" Daily Activity     Outcome Measure   Help from another person eating meals?: A Little Help from another person taking care of personal grooming?: A Little Help from another person toileting, which includes using toliet, bedpan, or urinal?: A Lot Help from another person bathing (including washing, rinsing, drying)?: A Lot Help from another person to put on and taking off regular upper body clothing?: A Lot Help from another person to put on and taking off regular lower body clothing?: A Lot 6 Click Score: 14    End of Session    OT Visit Diagnosis: Unsteadiness on feet (R26.81);Pain;Muscle weakness (generalized) (M62.81)   Activity Tolerance Patient tolerated treatment well   Patient Left in bed;with call bell/phone within reach   Nurse Communication Other (comment) (nurse cleared patient to participate in session)        Time: 8469-6295 OT Time Calculation (min): 14 min  Charges: OT General Charges $OT Visit: 1 Visit OT Treatments $Self Care/Home Management : 8-22 mins  [name] OTR/L, [facility] Acute Rehabilitation Department Office# [phone] Pager# [phone]   [place] 01/24/2021, 2:10 PM

## 2021-01-24 NOTE — Progress Notes (Addendum)
Daily Progress Note   Patient Name: [name]       Date: 01/24/2021 DOB: Oct 04, 1945  Age: 75 y.o. MRN#: [id number] Attending Physician: Donne Hazel, MD Primary Care Physician: Cassandria Anger, MD Admit Date: 01/15/2021  Reason for Consultation/Follow-up: Establishing goals of care  Subjective: Awake alert, no distress, anticipates d/c to rehab soon.  Medication history reviewed.  Patient has required 4 doses of 2 mg each of oral Dilaudid in the last 24 hours.  Length of Stay: 9  Current Medications: Scheduled Meds:   (feeding supplement) PROSource Plus  30 mL Oral BID BM   apixaban  5 mg Oral BID   chlorhexidine  15 mL Mouth Rinse BID   Chlorhexidine Gluconate Cloth  6 each Topical Daily   feeding supplement  237 mL Oral BID BM   folic acid  1 mg Oral Daily   gabapentin  600 mg Oral QHS   Gerhardt's butt cream   Topical BID   ipratropium-albuterol  3 mL Nebulization TID   linezolid  600 mg Oral Q12H   mouth rinse  15 mL Mouth Rinse q12n4p   melatonin  3 mg Oral QHS   metoprolol tartrate  25 mg Oral BID   mometasone-formoterol  2 puff Inhalation BID   multivitamin with minerals  1 tablet Oral Daily   nutrition supplement (JUVEN)  1 packet Oral BID BM   pantoprazole  40 mg Oral QPM   pyridOXINE  50 mg Oral Daily   senna-docusate  1 tablet Oral BID   tamsulosin  0.4 mg Oral Daily   vitamin B-12  1,000 mcg Oral Daily    Continuous Infusions:  sodium chloride Stopped (01/20/21 2041)    PRN Meds: acetaminophen **OR** acetaminophen, albuterol, alum & mag hydroxide-simeth, bisacodyl, HYDROmorphone, ondansetron (ZOFRAN) IV, polyethylene glycol, polyvinyl alcohol, sodium chloride flush  Physical Exam         Awake alert No distress Regular work of  breathing S 1 S 2  Abdomen soft, not tender Has bilateral dressings both LE, some edema.  No focal deficits.  Has some redness both UE and some degree of edema.   Vital Signs: BP 102/64   Pulse 85   Temp 98 F (36.7 C) (Oral)   Resp 20   Ht 5' 6"  (1.676 m)  Wt 69.5 kg   SpO2 94%   BMI 24.73 kg/m  SpO2: SpO2: 94 % O2 Device: O2 Device: Nasal Cannula O2 Flow Rate: O2 Flow Rate (L/min): 2 L/min  Intake/output summary:  Intake/Output Summary (Last 24 hours) at 01/24/2021 1320 Last data filed at 01/24/2021 0630 Gross per 24 hour  Intake 480 ml  Output 250 ml  Net 230 ml    LBM: Last BM Date: 01/21/21 Baseline Weight: Weight: 66.9 kg Most recent weight: Weight: 69.5 kg       Palliative Assessment/Data:      Patient Active Problem List   Diagnosis Date Noted   Palliative care by specialist    General weakness    Sepsis ([place]) 01/16/2021   Sepsis due to cellulitis ([place]) 01/15/2021   Prolonged QT interval 01/15/2021   Pulmonary nodules 01/15/2021   AKI (acute kidney injury) ([place]) 01/15/2021   Open thigh wound    Citrobacter infection    Pseudomonas infection    Pressure injury of skin 12/21/2020   Fall    Hyperbilirubinemia    Malignant melanoma ([place])    Cellulitis 12/20/2020   Blisters of multiple sites 11/05/2020   Goals of care, counseling/discussion 10/12/2020   Atherosclerosis of aorta ([place]) 07/24/2020   Symptomatic anemia 05/27/2020   Pulmonary embolus ([place]) 05/27/2020   Acute respiratory failure with hypoxia ([place]) 05/26/2020   Callus of foot 04/21/2018   Edema 03/27/2018   Toxic gastroenteritis and colitis 03/25/2018   Other ulcerative 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 ([place]) 96/78/9381   Diarrhea 12/23/2017   Nausea & vomiting 12/23/2017   Herpes zoster 07/07/2017   Constipation 04/21/2017   Genetic testing 03/06/2017   Family history of cancer    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[place]) 02/12/2017   Iron deficiency anemia due to chronic blood loss  12/27/2016   Scalp wound 06/13/2016   Scalp laceration, subsequent encounter 06/04/2016   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   Pain in joint, ankle and foot 09/28/2015   Anemia due to chronic blood loss 09/28/2015   Hyponatremia 09/28/2015   Cramp in limb 09/28/2015   Exertional angina ([place]) 06/17/2014   DOE (dyspnea on exertion) 04/28/2014   Hypokalemia 03/11/2014   Counseling regarding goals of care 12/31/2013   Impaired glucose tolerance 12/31/2013   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[place]) 11/18/2013   Fever 11/18/2013   Leukocytosis 10/30/2013   Fatigue 10/22/2013   Hoarseness of voice 08/10/2013   Sleep disorder 07/05/2013   Tension headache 07/01/2013   Polypharmacy 06/19/2013   Lumbar post-laminectomy syndrome 05/27/2013   Degenerative arthritis of spine 05/26/2013   Hemochromatosis 05/26/2013   Wound infection after surgery 05/26/2013   Normocytic anemia 05/26/2013   Unintentional weight loss 05/26/2013   Protein-calorie malnutrition, severe ([place]) 05/25/2013   Chronic pain syndrome 05/23/2013   COPD (chronic obstructive pulmonary disease) ([place]) 05/06/2013   Frequent falls 05/06/2013   Encounter for postoperative wound care 05/06/2013   Dehydration 03/17/2013   Atrial fibrillation ([place]) 08/07/2010   Rheumatoid arthritis ([place]) 10/18/2008   Anxiety 11/09/2007   Elevated lipids 03/30/2007   PSEUDOGOUT 03/30/2007   Essential hypertension 03/30/2007   Coronary atherosclerosis 03/30/2007    Palliative Care Assessment & Plan   Patient Profile:    Assessment:  75 y.o. male admitted on 01/15/2021   [name] is a 75 y.o. male with a PMH significant for CAD, CHF, COPD, maligant melanoma, paroxysmal A-fib on Eliquis, HLD, prior DVT and PE, RA, HTN, and ETOH use who presented to the ED after  suffering a fall at [facility].   Patient sees Dr Marin Olp for melanoma, found to be recurrent, based on PET scan in April 2022. Patient underwent  excision of L thigh nodule, excision of L inguinal lymph node and with lymphedema post biopsy. Patient admitted with cellulitis of LLE recently. Remains admitted to the hospital for LLE cellulitis, hospital course with acute hypoxic resp failure, currently off BIPAP.   Recommendations/Plan: Continue current mode of care Recommend SNF rehab with palliative on discharge, patient agreeable to re consider [facility] towards the end of this hospitalization, TOC assisting with disposition.   Goals of Care and Additional Recommendations: Limitations on Scope of Treatment: Full Scope Treatment  Code Status:    Code Status Orders  (From admission, onward)           Start     Ordered   01/15/21 1557  Do not attempt resuscitation (DNR)  Continuous       Question Answer Comment  In the event of cardiac or respiratory ARREST Do not call a "code blue"   In the event of cardiac or respiratory ARREST Do not perform Intubation, CPR, defibrillation or ACLS   In the event of cardiac or respiratory ARREST Use medication by any route, position, wound care, and other measures to relive pain and suffering. May use oxygen, suction and manual treatment of airway obstruction as needed for comfort.   Comments DNR confirmed with patient      01/15/21 1556           Code Status History     Date Active Date Inactive Code Status Order ID Comments User Context   12/20/2020 1800 12/27/2020 1732 DNR [id number]  Jonnie Finner, DO Inpatient   05/27/2020 0125 05/29/2020 1847 DNR [id number]  Lenore Cordia, MD Inpatient   02/12/2017 1815 02/13/2017 1604 Full Code [id number]  Stark Klein, MD Inpatient   06/17/2014 0948 06/18/2014 1334 Full Code [id number]  Sherren Mocha, MD Inpatient   10/30/2013 0052 11/01/2013 1341 Full Code [id number]  Shanda Howells, MD ED   06/16/2013 2154 06/19/2013 1600 Full Code [id number]  Merton Border, MD Inpatient   06/08/2013 1534 06/10/2013 1911 Full Code [id number]  Luanne Bras, MD Inpatient    05/27/2013 1642 06/08/2013 1534 Full Code [id number]  Cathlyn Parsons, PA-C Inpatient   05/27/2013 1642 05/27/2013 1642 Full Code [id number]  Cathlyn Parsons, PA-C Inpatient   05/23/2013 2239 05/27/2013 1642 Full Code [id number]  Jonetta Osgood, MD Inpatient   05/06/2013 1552 05/09/2013 1603 Full Code [id number]  Robbie Lis, MD ED   04/13/2013 1713 04/24/2013 2037 Full Code [id number]  Orvan Falconer, MD Inpatient   03/16/2013 1650 04/06/2013 1626 Full Code [id number]  Floyce Stakes, MD Inpatient   03/02/2013 1741 03/10/2013 2021 Full Code [id number]  Floyce Stakes, MD Inpatient      Advance Directive Documentation    Flowsheet Row Most Recent Value  Type of Advance Directive Healthcare Power of Attorney  Pre-existing out of facility DNR order (yellow form or pink MOST form) --  "MOST" Form in Place? --       Prognosis:  Unable to determine  Discharge Planning: [facility] for rehab with Palliative care service follow-up  Care plan was discussed with  patient.   Thank you for allowing the Palliative Medicine Team to assist in the care of this patient.   Time In: 11 Time Out: 11.15 Total Time 15 Prolonged Time Billed  no  Greater than 50%  of this time was spent counseling and coordinating care related to the above assessment and plan.  Loistine Chance, MD  Please contact Palliative Medicine Team phone at [phone] for questions and concerns.

## 2021-01-24 NOTE — Progress Notes (Signed)
Nutrition Follow-up  DOCUMENTATION CODES:   Non-severe (moderate) malnutrition in context of chronic illness  INTERVENTION:  - continue Ensure Plus/Ensure Enlive BID, Juven BID, and 30 ml Prosource Plus BID.    NUTRITION DIAGNOSIS:   Moderate Malnutrition related to chronic illness, cancer and cancer related treatments as evidenced by mild fat depletion, mild muscle depletion. -ongoing,revised   GOAL:   Patient will meet greater than or equal to 90% of their needs - Met on average  MONITOR:   PO intake, Supplement acceptance, Labs, Weight trends   ASSESSMENT:   75 y.o. male with medical history of alcohol abuse, allergic rhinitis, anxiety, CAD, CHF, chronic back pain, COPD, depression, diverticulosis, history of DVT and PE, hemochromatosis, GERD, frequent falls, HLD, chronic blood loss anemia, narcotic abuse history, neuropathy, osteoarthritis, and rheumatoid arthritis. He presented to the ED via EMS from [facility] due to increased weakness and multiple wounds to LLE 2/2 metastatic melanoma.  Patient has been eating 100% at most meals since 10/13. He has been accepting Ensure 100% of the time offered, Juven 90% of the time offered, and Prosource 50% of the time offered.  Patient sitting in the chair with lunch tray in front of him. Assisted patient with the meal. He reports great appetite now that diet liberalized from Heart Healthy to Regular; he found the food to be undesirable when he could not have salt. He enjoys Ensure supplements and sips on them throughout the day.   Patient states that yesterday was a bad day for him and he felt very unwell, had extreme pain in his hands. He is feeling much better today and may be discharged back to facility later today.   Weight yesterday was +6 lb compared to admission weight.   Updated Skin assessment section; previously documented that he had a stage 3 pressure injury to buttocks 2/2 metastatic melanoma, but this skin issue is no  longer present in the flow sheet.      Labs reviewed; Cl: 95 mmol/l, BUN: 24 mg/dl, creatinine: 0.58 mg/dl, Ca: 7.8 mg/dl.  Medications reviewed; 1 mg folvite/day, 3 mg melatonin/day, 1 tablet multivitamin with minerals/day, 40 mg oral protonix/day, 1 tablet senokot BID, 1000 mcg oral cyanocobalamin/day.    Diet Order:   Diet Order             Diet regular Room service appropriate? Yes; Fluid consistency: Thin  Diet effective now                   EDUCATION NEEDS:   Not appropriate for education at this time  Skin:  Skin Assessment: Skin Integrity Issues: Skin Integrity Issues:: Incisions, Other (Comment) Stage III: L buttocks Incisions: open L thigh wound Other: skin tear to R pretibial  Last BM:  1/16 (type 4 x1)  Height:   Ht Readings from Last 1 Encounters:  01/18/21 5' 6"  (1.676 m)    Weight:   Wt Readings from Last 1 Encounters:  01/23/21 69.5 kg    Estimated Nutritional Needs:  Kcal:  2085-2250 kcal Protein:  105-115 grams Fluid:  >/= 2 L/day     Jarome Matin, MS, RD, LDN, CNSC Inpatient Clinical Dietitian RD pager # available in [facility]  After hours/weekend pager # available in [facility]

## 2021-01-24 NOTE — Discharge Summary (Signed)
Physician Discharge Summary  [name] SJG:[id number] DOB: February 01, 1946 DOA: 01/15/2021  PCP: Cassandria Anger, MD  Admit date: 01/15/2021 Discharge date: 01/24/2021  Admitted From: SNF Disposition:  SNF  Recommendations for Outpatient Follow-up:  Follow up with PCP in 1-2 weeks Follow up with Dr. Marin Olp as scheduled Palliative Care referral at SNF  Discharge Condition:Improved CODE STATUS:DNR Diet recommendation: Heart healthy   Brief/Interim Summary: 75 y.o. male with medical history significant of alcohol abuse, allergic rhinitis, anxiety, CAD, chronic diastolic heart failure, chronic back pain, COPD, depression, diverticulosis, history of DVT and PE, history of single episode of A. fib, hemochromatosis, GERD, frequent falls, hyperlipidemia, chronic blood loss anemia, narcotic abuse history, history of neuropathy, osteoarthritis, rheumatoid arthritis presented from SNF weakness and multiple wounds on lower extremity.  On presentation, temperature was 103.2 with tachypnea.  White count was 15.5, lactic acid was 2.6 and then 2.3, creatinine of 2.02.  He was started on broad-spectrum antibiotics.  He subsequently required BiPAP and also had to be briefly started on pressors; he was transferred to ICU and PCCM took over his care on 01/17/2021.  He is off pressors currently and care has been transferred back to [facility] on 01/18/2021.  Discharge Diagnoses:  Principal Problem:   Sepsis due to cellulitis [facility]) Active Problems:   Anxiety   Essential hypertension   Coronary atherosclerosis   Atrial fibrillation (HCC)   COPD (chronic obstructive pulmonary disease) (HCC)   Lumbar post-laminectomy syndrome   Pulmonary embolus (HCC)   Open thigh wound   Prolonged QT interval   Pulmonary nodules   AKI (acute kidney injury) ([place])   Sepsis ([place])   Palliative care by specialist   General weakness    1 septic shock likely secondary lower extremity cellulitis -Patient presented with  criteria for sepsis with fever, tachypnea, elevated lactic acid level, leukocytosis and evidence of lower extremity cellulitis. -Cellulitis noted to be more evident on the right lower extremity. -Patient was initially on IV vancomycin and Azactam. -Azactam discontinued. -COVID-19 PCR negative. -Patient initially was transferred to the ICU briefly and placed on pressors. -Care taken over by PCCM on 01/17/2021, was transitioned off pressors and patient transferred back to [facility] service on 01/18/2021. -Hypotension improved. -Blood cultures with no growth to date. -Urine culture with multiple species. -Discontinued IV vancomycin and patient started on Zyvox (01/20/2021) x 5 days, to complete course through 10/20.  -Supportive care.  2.  Acute hypoxemic respiratory failure/COPD -Patient noted to have a worsening respiratory status after hospitalization requiring BiPAP and felt respiratory status had worsened after oral Dilaudid use. -Patient seen by PCCM. -Currently off BiPAP. -Received a dose of Lasix  3.  Acute metabolic encephalopathy -Felt likely multifactorial from acute sepsis and narcotic use. -Patient given 1 dose of Narcan on 01/16/2021 with improvement with mental status. -Oral Dilaudid resumed on 01/17/2021 per PCCM. -Patient was placed on a decreased dose of Dilaudid however it is noted per Dr.Alekh that patient was adamant he wanted to continue current dose as he been on it for 6-7 years. -Follow.  4.  AKI -Resolved with hydration.  5.  Leukocytosis -Resolved.  6.  Microcytic anemia -Vitamin B12 supplementation, folic acid. -Patient seen by hematology/oncology and status post transfusion 2 units packed red blood cells per hematology/oncology.   -Hemoglobin remained stable  7.  Hypertension -BP soft. -Status post IV albumin.  -Blood pressure improved.  -On metoprolol.  - status post transfusion 2 units packed red blood cells.    8.  Paroxysmal  atrial  fibrillation -Metoprolol initially held due to soft/low blood pressure and resumed.  -Eliquis for anticoagulation.  9.  History of PE -Eliquis.   10.  Lower extremity wounds, POA -Continue dressing changes as recommended by wound care RN.  11.?  Pulmonary nodules on x-ray -Outpatient follow-up with PCP/pulmonary.  12.  Anxiety -Fluoxetine on hold until Zyvox has been completed and could subsequently resume after that.    -Alprazolam on hold, pt remained stable  13.  Generalized deconditioning -PT/OT.  14.  Recurrent melanoma/chronic pain -Being followed by oncology. -Continue current pain management. -Per oncology.  15.  Hypokalemia -Due to diuresis.  Repleted.    Discharge Instructions   Allergies as of 01/24/2021       Reactions   Cefepime Hives, Shortness Of Breath   Cephaeline Hives   Morphine Swelling   A swollen stomach.   Morphine And Related Shortness Of Breath, Nausea And Vomiting, Swelling, Other (See Comments)   Agitation, tolerates dilaudid Other reaction(s): Other (See Comments) Agitation, tolerates dilaudid   Penicillins Hives, Shortness Of Breath, Rash   Has patient had a PCN reaction causing immediate rash, facial/tongue/throat swelling, SOB or lightheadedness with hypotension: Yes Has patient had a PCN reaction causing severe rash involving mucus membranes or skin necrosis: Yes Has patient had a PCN reaction that required hospitalization No Has patient had a PCN reaction occurring within the last 10 years: No If all of the above answers are "NO", then may proceed with Cephalosporin use.   Doxycycline Rash   Oxycodone-acetaminophen Other (See Comments)   Patient doesn't remember what type of reaction.        Medication List     STOP taking these medications    ALPRAZolam 0.5 MG tablet Commonly known as: XANAX   FLUoxetine 20 MG capsule Commonly known as: PROZAC   FLUoxetine 40 MG capsule Commonly known as: PROZAC   furosemide 20 MG  tablet Commonly known as: LASIX   mirtazapine 7.5 MG tablet Commonly known as: REMERON   temazepam 15 MG capsule Commonly known as: RESTORIL   torsemide 100 MG tablet Commonly known as: DEMADEX   traZODone 100 MG tablet Commonly known as: DESYREL       TAKE these medications    albuterol 108 (90 Base) MCG/ACT inhaler Commonly known as: VENTOLIN HFA Inhale 1-2 puffs into the lungs every 4 (four) hours as needed for wheezing or shortness of breath.   apixaban 5 MG Tabs tablet Commonly known as: ELIQUIS Take 1 tablet (5 mg total) by mouth 2 (two) times daily.   B-6 PO Take 1 tablet by mouth daily.   fluticasone 50 MCG/ACT nasal spray Commonly known as: FLONASE INSTILL 2 SPRAYS IN EACH NOSTRIL EVERY DAY What changed:  how much to take how to take this when to take this reasons to take this additional instructions   gabapentin 300 MG capsule Commonly known as: NEURONTIN Take 2 capsules (600 mg total) by mouth at bedtime.   HYDROmorphone 2 MG tablet Commonly known as: DILAUDID Take 1-1.5 tablets (2-3 mg total) by mouth every 8 (eight) hours as needed for severe pain.   lidocaine-prilocaine cream Commonly known as: EMLA Apply 1 application topically as needed. What changed:  when to take this reasons to take this   linezolid 600 MG tablet Commonly known as: ZYVOX Take 1 tablet (600 mg total) by mouth every 12 (twelve) hours for 2 days.   melatonin 3 MG Tabs tablet Take 1 tablet (3 mg total) by mouth  at bedtime. What changed:  medication strength how much to take   metoprolol tartrate 25 MG tablet Commonly known as: LOPRESSOR TAKE 1 TABLET TWO TIMES DAILY. What changed: See the new instructions.   mupirocin ointment 2 % Commonly known as: BACTROBAN On leg wound w/dressing change qd or bid What changed:  how much to take how to take this when to take this   pantoprazole 40 MG tablet Commonly known as: PROTONIX Take 1 tablet (40 mg total) by  mouth every evening. What changed: when to take this   polyvinyl alcohol 1.4 % ophthalmic solution Commonly known as: LIQUIFILM TEARS Place 1 drop into both eyes daily as needed for dry eyes.   predniSONE 5 MG tablet Commonly known as: DELTASONE Take 10 mg by mouth daily.   tamsulosin 0.4 MG Caps capsule Commonly known as: FLOMAX Take 1 capsule (0.4 mg total) by mouth daily. Start taking on: January 25, 2021   vitamin B-12 1000 MCG tablet Commonly known as: CYANOCOBALAMIN Take 1,000 mcg by mouth daily.   Vitamin D3 50 MCG (2000 UT) capsule Take 1 capsule (2,000 Units total) by mouth daily.        Follow-up Information     Plotnikov, Evie Lacks, MD Follow up in 2 week(s).   Specialty: Internal Medicine Why: Hospital follow up Contact information: [place] [address] [phone]         Josue Hector, MD .   Specialty: Cardiology Contact information: [phone] [place] [address] [phone]                Allergies  Allergen Reactions   Cefepime Hives and Shortness Of Breath   Cephaeline Hives   Morphine Swelling    A swollen stomach.   Morphine And Related Shortness Of Breath, Nausea And Vomiting, Swelling and Other (See Comments)    Agitation, tolerates dilaudid Other reaction(s): Other (See Comments) Agitation, tolerates dilaudid   Penicillins Hives, Shortness Of Breath and Rash    Has patient had a PCN reaction causing immediate rash, facial/tongue/throat swelling, SOB or lightheadedness with hypotension: Yes Has patient had a PCN reaction causing severe rash involving mucus membranes or skin necrosis: Yes Has patient had a PCN reaction that required hospitalization No Has patient had a PCN reaction occurring within the last 10 years: No If all of the above answers are "NO", then may proceed with Cephalosporin use.    Doxycycline Rash   Oxycodone-Acetaminophen Other (See Comments)    Patient  doesn't remember what type of reaction.    Consultations: PCCM Oncology Palliative care  Procedures/Studies: DG Chest 2 View  Result Date: 01/15/2021 CLINICAL DATA:  Weakness, fall. Suspected sepsis. History of metastatic melanoma EXAM: CHEST - 2 VIEW COMPARISON:  12/22/2020 FINDINGS: Unchanged positioning of left-sided Port-A-Cath. Stable heart size. Aortic atherosclerosis. Low lung volumes with chronically elevated right hemidiaphragm. Unchanged blunting of the left costophrenic angle, may represent scarring versus small effusion. There are a few small nodular densities within the left lung which are new from prior. No pneumothorax. IMPRESSION: There are a few small nodular densities within the left lung, new from prior. Findings may reflect areas of infection or inflammation although pulmonary nodules are not excluded given history of cancer. Recommend short-term follow-up chest radiograph to ensure resolution. Electronically Signed   By: Davina Poke D.O.   On: 01/15/2021 12:13   DG CHEST PORT 1 VIEW  Result Date: 01/16/2021 CLINICAL DATA:  Shortness of breath  EXAM: PORTABLE CHEST 1 VIEW COMPARISON:  01/15/2021 FINDINGS: Elevation of the right hemidiaphragm. Worsening bilateral airspace disease. Heart is upper limits normal in size. Aortic atherosclerosis. Left Port-A-Cath remains in place, unchanged. Suspect small layering effusions. IMPRESSION: Worsening bilateral airspace disease which could reflect edema or infection. Suspect small layering effusions. Electronically Signed   By: Rolm Baptise M.D.   On: 01/16/2021 12:16   VAS [place] UPPER EXTREMITY VENOUS DUPLEX  Result Date: 01/19/2021 UPPER VENOUS STUDY  Patient Name:  [name]  Date of Exam:   01/18/2021 Medical Rec #: [id number]           Accession #:    [id number] Date of Birth: 03-28-1946           Patient Gender: M Patient Age:   61 years Exam Location[facility]  [facility] Procedure:      VAS [place] UPPER EXTREMITY VENOUS  DUPLEX Referring Phys: Aline August --------------------------------------------------------------------------------  Indications: Edema Limitations: Body habitus, poor ultrasound/tissue interface and restricted mobility. Comparison Study: No prior study Performing Technologist: Maudry Mayhew MHA, RDMS, RVT, RDCS  Examination Guidelines: A complete evaluation includes B-mode imaging, spectral Doppler, color Doppler, and power Doppler as needed of all accessible portions of each vessel. Bilateral testing is considered an integral part of a complete examination. Limited examinations for reoccurring indications may be performed as noted.  Right Findings: +----------+------------+---------+-----------+----------+--------------+ RIGHT     CompressiblePhasicitySpontaneousProperties   Summary     +----------+------------+---------+-----------+----------+--------------+ Subclavian                                          Not visualized +----------+------------+---------+-----------+----------+--------------+  Left Findings: +----------+------------+---------+-----------+----------+-------+ LEFT      CompressiblePhasicitySpontaneousPropertiesSummary +----------+------------+---------+-----------+----------+-------+ IJV           Full       Yes       Yes                      +----------+------------+---------+-----------+----------+-------+ Subclavian    Full       Yes       Yes                      +----------+------------+---------+-----------+----------+-------+ Axillary      Full       Yes       Yes                      +----------+------------+---------+-----------+----------+-------+ Brachial      Full       Yes       Yes                      +----------+------------+---------+-----------+----------+-------+ Radial        Full                                          +----------+------------+---------+-----------+----------+-------+ Ulnar         Full                                           +----------+------------+---------+-----------+----------+-------+ Cephalic      Full                                          +----------+------------+---------+-----------+----------+-------+  Basilic       Full                                          +----------+------------+---------+-----------+----------+-------+  Summary:  Left: No evidence of deep vein thrombosis in the upper extremity. No evidence of superficial vein thrombosis in the upper extremity.  *See table(s) above for measurements and observations.  Diagnosing physician: Orlie Pollen Electronically signed by Orlie Pollen on 01/19/2021 at 8:18:06 AM.    Final     Subjective: Eager to start therapy  Discharge Exam: Vitals:   01/24/21 0934 01/24/21 1000  BP: 102/64   Pulse: 85   Resp:  20  Temp:    SpO2:     Vitals:   01/24/21 0452 01/24/21 0813 01/24/21 0934 01/24/21 1000  BP: 129/69  102/64   Pulse: 79  85   Resp: 16   20  Temp: 98 F (36.7 C)     TempSrc: Oral     SpO2: 94% 94%    Weight:      Height:        General: Pt is alert, awake, not in acute distress Cardiovascular: RRR, S1/S2 + Respiratory: CTA bilaterally, no wheezing, no rhonchi Abdominal: Soft, NT, ND, bowel sounds + Extremities: no edema, no cyanosis   The results of significant diagnostics from this hospitalization (including imaging, microbiology, ancillary and laboratory) are listed below for reference.     Microbiology: Recent Results (from the past 240 hour(s))  Culture, blood (Routine x 2)     Status: None   Collection Time: 01/15/21 10:57 AM   Specimen: BLOOD  Result Value Ref Range Status   Specimen Description   Final    BLOOD LEFT ANTECUBITAL Performed at [facility] [address] [place], [place] [address]    Special Requests   Final    BOTTLES DRAWN AEROBIC AND ANAEROBIC Blood Culture adequate volume Performed at [facility] [address], [place], [place] [address]    Culture   Final    NO GROWTH 5 DAYS Performed at [facility], [place] [address] [place], [place] [address]    Report Status 01/20/2021 FINAL  Final  Culture, blood (Routine x 2)     Status: None   Collection Time: 01/15/21 12:31 PM   Specimen: BLOOD  Result Value Ref Range Status   Specimen Description   Final    BLOOD SITE NOT SPECIFIED Performed at [facility] [address] [place], [place] [address]    Special Requests   Final    BOTTLES DRAWN AEROBIC AND ANAEROBIC Blood Culture adequate volume Performed at [facility] [address], [place], [place] [address]    Culture   Final    NO GROWTH 5 DAYS Performed at [facility], [place] [address] [place], [place] [address]    Report Status 01/20/2021 FINAL  Final  Resp Panel by RT-PCR (Flu A&B, Covid)     Status: None   Collection Time: 01/15/21 12:31 PM   Specimen: Nasopharyngeal(NP) swabs in vial transport medium  Result Value Ref Range Status   SARS Coronavirus 2 by RT PCR NEGATIVE NEGATIVE Final    Comment: (NOTE) SARS-CoV-2 target nucleic acids are NOT DETECTED.  The SARS-CoV-2 RNA is generally detectable in upper respiratory specimens during the acute phase of infection. The lowest  concentration of SARS-CoV-2 viral copies this assay can detect is 138 copies/mL. A negative result does not preclude SARS-Cov-2 infection and should not be used as the sole basis for treatment or other patient management decisions. A negative result may occur with  improper specimen collection/handling, submission of specimen other than nasopharyngeal swab, presence of viral mutation(s) within the areas targeted by this assay, and inadequate number of viral copies(<138 copies/mL). A negative result must be combined with clinical observations, patient history, and epidemiological information. The expected result is Negative.  Fact Sheet for  Patients:  EntrepreneurPulse.com.au  Fact Sheet for Healthcare Providers:  IncredibleEmployment.be  This test is no t yet approved or cleared by the [place] FDA and  has been authorized for detection and/or diagnosis of SARS-CoV-2 by FDA under an Emergency Use Authorization (EUA). This EUA will remain  in effect (meaning this test can be used) for the duration of the COVID-19 declaration under Section 564(b)(1) of the Act, 21 U.S.C.section 360bbb-3(b)(1), unless the authorization is terminated  or revoked sooner.       Influenza A by PCR NEGATIVE NEGATIVE Final   Influenza B by PCR NEGATIVE NEGATIVE Final    Comment: (NOTE) The Xpert Xpress SARS-CoV-2/FLU/RSV plus assay is intended as an aid in the diagnosis of influenza from Nasopharyngeal swab specimens and should not be used as a sole basis for treatment. Nasal washings and aspirates are unacceptable for Xpert Xpress SARS-CoV-2/FLU/RSV testing.  Fact Sheet for Patients: EntrepreneurPulse.com.au  Fact Sheet for Healthcare Providers: IncredibleEmployment.be  This test is not yet approved or cleared by the [place] FDA and has been authorized for detection and/or diagnosis of SARS-CoV-2 by FDA under an Emergency Use Authorization (EUA). This EUA will remain in effect (meaning this test can be used) for the duration of the COVID-19 declaration under Section 564(b)(1) of the Act, 21 U.S.C. section 360bbb-3(b)(1), unless the authorization is terminated or revoked.  Performed at [facility], [place] [address] [place], [place] [address]   Urine Culture     Status: Abnormal   Collection Time: 01/15/21 11:00 PM   Specimen: In/Out Cath Urine  Result Value Ref Range Status   Specimen Description   Final    IN/OUT CATH URINE Performed at [facility] [address], [place], [place] [address]    Special  Requests   Final    NONE Performed at [facility], [place] [address] [place], [place] [address]    Culture MULTIPLE SPECIES PRESENT, SUGGEST RECOLLECTION (A)  Final   Report Status 01/16/2021 FINAL  Final  MRSA Next Gen by PCR, Nasal     Status: None   Collection Time: 01/16/21  6:05 PM   Specimen: Nasal Mucosa; Nasal Swab  Result Value Ref Range Status   MRSA by PCR Next Gen NOT DETECTED NOT DETECTED Final    Comment: (NOTE) The GeneXpert MRSA Assay (FDA approved for NASAL specimens only), is one component of a comprehensive MRSA colonization surveillance program. It is not intended to diagnose MRSA infection nor to guide or monitor treatment for MRSA infections. Test performance is not FDA approved in patients less than 8 years old. Performed at [facility], [place] [address] [place], [place] [address]      Labs: BNP (last 3 results) No results for input(s): BNP in the last 8760 hours. Basic Metabolic Panel: Recent Labs  Lab 01/18/21 0345 01/19/21 0336 01/20/21 0339 01/21/21 0132 01/22/21 0325 01/23/21 0425 01/24/21 0409  NA  142 138 139 137 137 138 136  K 3.6 4.0 3.5 3.2* 4.2 4.1 4.3  CL 109 102 103 99 100 95* 95*  CO2 29 30 33* 34* 34* 38* 34*  GLUCOSE 196* 98 87 101* 92 103* 121*  BUN 27* 29* 21 18 14 17  24*  CREATININE 0.61 0.62 0.51* 0.48* 0.51* 0.61 0.58*  CALCIUM 8.1* 8.1* 7.7* 7.9* 7.7* 7.7* 7.8*  MG 2.4 2.3  --   --   --   --  2.0  PHOS 1.9*  --  2.8  --   --   --   --    Liver Function Tests: Recent Labs  Lab 01/23/21 0425 01/24/21 0409  AST 16 15  ALT 12 12  ALKPHOS 32* 37*  BILITOT 0.9 0.8  PROT 4.9* 5.0*  ALBUMIN 2.6* 2.5*   No results for input(s): LIPASE, AMYLASE in the last 168 hours. No results for input(s): AMMONIA in the last 168 hours. CBC: Recent Labs  Lab 01/19/21 0336 01/20/21 0339 01/21/21 0132 01/22/21 0325 01/23/21 0425 01/24/21 0409  WBC 7.9 6.8 6.7 5.5 7.0 10.7*  NEUTROABS 6.2 5.2  5.0 3.8 4.9  --   HGB 8.5* 8.0* 8.2* 7.8* 10.3* 10.5*  HCT 29.3* 26.8* 26.4* 26.6* 33.2* 34.3*  MCV 101.4* 100.4* 98.1 99.3 95.4 96.6  PLT 181 210 216 236 231 220   Cardiac Enzymes: No results for input(s): CKTOTAL, CKMB, CKMBINDEX, TROPONINI in the last 168 hours. BNP: Invalid input(s): POCBNP CBG: No results for input(s): GLUCAP in the last 168 hours. D-Dimer No results for input(s): DDIMER in the last 72 hours. Hgb A1c No results for input(s): HGBA1C in the last 72 hours. Lipid Profile No results for input(s): CHOL, HDL, LDLCALC, TRIG, CHOLHDL, LDLDIRECT in the last 72 hours. Thyroid function studies No results for input(s): TSH, T4TOTAL, T3FREE, THYROIDAB in the last 72 hours.  Invalid input(s): FREET3 Anemia work up No results for input(s): VITAMINB12, FOLATE, FERRITIN, TIBC, IRON, RETICCTPCT in the last 72 hours. Urinalysis    Component Value Date/Time   COLORURINE YELLOW 01/15/2021 2300   APPEARANCEUR CLEAR 01/15/2021 2300   LABSPEC 1.017 01/15/2021 2300   PHURINE 5.0 01/15/2021 2300   GLUCOSEU NEGATIVE 01/15/2021 2300   GLUCOSEU NEGATIVE 03/10/2015 1008   HGBUR NEGATIVE 01/15/2021 2300   BILIRUBINUR NEGATIVE 01/15/2021 2300   KETONESUR NEGATIVE 01/15/2021 2300   PROTEINUR NEGATIVE 01/15/2021 2300   UROBILINOGEN 0.2 03/10/2015 1008   NITRITE NEGATIVE 01/15/2021 2300   LEUKOCYTESUR NEGATIVE 01/15/2021 2300   Sepsis Labs Invalid input(s): PROCALCITONIN,  WBC,  LACTICIDVEN Microbiology Recent Results (from the past 240 hour(s))  Culture, blood (Routine x 2)     Status: None   Collection Time: 01/15/21 10:57 AM   Specimen: BLOOD  Result Value Ref Range Status   Specimen Description   Final    BLOOD LEFT ANTECUBITAL Performed at [facility], [place] [address], [place], [place] [address]    Special Requests   Final    BOTTLES DRAWN AEROBIC AND ANAEROBIC Blood Culture adequate volume Performed at [facility] [address], [place], [place] [address]    Culture   Final    NO GROWTH 5 DAYS Performed at [facility], [place] [address] [place], [place] [address]    Report Status 01/20/2021 FINAL  Final  Culture, blood (Routine x 2)     Status: None   Collection Time: 01/15/21 12:31 PM   Specimen: BLOOD  Result Value Ref  Range Status   Specimen Description   Final    BLOOD SITE NOT SPECIFIED Performed at [facility] [address] [place], [place] [address]    Special Requests   Final    BOTTLES DRAWN AEROBIC AND ANAEROBIC Blood Culture adequate volume Performed at [facility] [address], [place], [place] [address]    Culture   Final    NO GROWTH 5 DAYS Performed at [facility], [place] [address], [place], [place] [address]    Report Status 01/20/2021 FINAL  Final  Resp Panel by RT-PCR (Flu A&B, Covid)     Status: None   Collection Time: 01/15/21 12:31 PM   Specimen: Nasopharyngeal(NP) swabs in vial transport medium  Result Value Ref Range Status   SARS Coronavirus 2 by RT PCR NEGATIVE NEGATIVE Final    Comment: (NOTE) SARS-CoV-2 target nucleic acids are NOT DETECTED.  The SARS-CoV-2 RNA is generally detectable in upper respiratory specimens during the acute phase of infection. The lowest concentration of SARS-CoV-2 viral copies this assay can detect is 138 copies/mL. A negative result does not preclude SARS-Cov-2 infection and should not be used as the sole basis for treatment or other patient management decisions. A negative result may occur with  improper specimen collection/handling, submission of specimen other than nasopharyngeal swab, presence of viral mutation(s) within the areas targeted by this assay, and inadequate number of viral copies(<138 copies/mL). A negative result must be combined with clinical observations, patient history, and epidemiological information. The expected result is Negative.  Fact Sheet for Patients:   EntrepreneurPulse.com.au  Fact Sheet for Healthcare Providers:  IncredibleEmployment.be  This test is no t yet approved or cleared by the [place] FDA and  has been authorized for detection and/or diagnosis of SARS-CoV-2 by FDA under an Emergency Use Authorization (EUA). This EUA will remain  in effect (meaning this test can be used) for the duration of the COVID-19 declaration under Section 564(b)(1) of the Act, 21 U.S.C.section 360bbb-3(b)(1), unless the authorization is terminated  or revoked sooner.       Influenza A by PCR NEGATIVE NEGATIVE Final   Influenza B by PCR NEGATIVE NEGATIVE Final    Comment: (NOTE) The Xpert Xpress SARS-CoV-2/FLU/RSV plus assay is intended as an aid in the diagnosis of influenza from Nasopharyngeal swab specimens and should not be used as a sole basis for treatment. Nasal washings and aspirates are unacceptable for Xpert Xpress SARS-CoV-2/FLU/RSV testing.  Fact Sheet for Patients: EntrepreneurPulse.com.au  Fact Sheet for Healthcare Providers: IncredibleEmployment.be  This test is not yet approved or cleared by the [place] FDA and has been authorized for detection and/or diagnosis of SARS-CoV-2 by FDA under an Emergency Use Authorization (EUA). This EUA will remain in effect (meaning this test can be used) for the duration of the COVID-19 declaration under Section 564(b)(1) of the Act, 21 U.S.C. section 360bbb-3(b)(1), unless the authorization is terminated or revoked.  Performed at [facility], [place] [address] [place], [place] [address]   Urine Culture     Status: Abnormal   Collection Time: 01/15/21 11:00 PM   Specimen: In/Out Cath Urine  Result Value Ref Range Status   Specimen Description   Final    IN/OUT CATH URINE Performed at [place] [address] [place], [place] [address]    Special Requests   Final     NONE Performed at [facility], [place] [address] [place], [place] [address]  Culture MULTIPLE SPECIES PRESENT, SUGGEST RECOLLECTION (A)  Final   Report Status 01/16/2021 FINAL  Final  MRSA Next Gen by PCR, Nasal     Status: None   Collection Time: 01/16/21  6:05 PM   Specimen: Nasal Mucosa; Nasal Swab  Result Value Ref Range Status   MRSA by PCR Next Gen NOT DETECTED NOT DETECTED Final    Comment: (NOTE) The GeneXpert MRSA Assay (FDA approved for NASAL specimens only), is one component of a comprehensive MRSA colonization surveillance program. It is not intended to diagnose MRSA infection nor to guide or monitor treatment for MRSA infections. Test performance is not FDA approved in patients less than 63 years old. Performed at [facility], [place] [address] [place], [place] [address]    Time spent: 78mn  SIGNED:   SMarylu Lund MD  [facility] 01/24/2021, 1:16 PM  If 7PM-7AM, please contact night-coverage

## 2021-01-25 DIAGNOSIS — L03116 Cellulitis of left lower limb: Secondary | ICD-10-CM | POA: Diagnosis not present

## 2021-01-25 DIAGNOSIS — I509 Heart failure, unspecified: Secondary | ICD-10-CM | POA: Diagnosis not present

## 2021-01-25 DIAGNOSIS — R5381 Other malaise: Secondary | ICD-10-CM | POA: Diagnosis not present

## 2021-01-25 DIAGNOSIS — R600 Localized edema: Secondary | ICD-10-CM | POA: Diagnosis not present

## 2021-01-25 NOTE — Progress Notes (Signed)
Pt discharged to ALF via [facility], pt alert, VSS, no s/s of respiratory distress, O2 sat 97% on  2L [place], able to ambulate to the bathroom with FWW 1 assist, given PRN med for back pain and all belongings given to the pt. Discharge instructions packet given.

## 2021-02-01 DIAGNOSIS — R600 Localized edema: Secondary | ICD-10-CM | POA: Diagnosis not present

## 2021-02-01 DIAGNOSIS — C439 Malignant melanoma of skin, unspecified: Secondary | ICD-10-CM | POA: Diagnosis not present

## 2021-02-01 DIAGNOSIS — L03116 Cellulitis of left lower limb: Secondary | ICD-10-CM | POA: Diagnosis not present

## 2021-02-01 DIAGNOSIS — R5381 Other malaise: Secondary | ICD-10-CM | POA: Diagnosis not present

## 2021-02-05 ENCOUNTER — Emergency Department (HOSPITAL_COMMUNITY): Payer: Medicare Other

## 2021-02-05 ENCOUNTER — Other Ambulatory Visit: Payer: Self-pay

## 2021-02-05 ENCOUNTER — Encounter (HOSPITAL_COMMUNITY): Payer: Self-pay

## 2021-02-05 ENCOUNTER — Inpatient Hospital Stay (HOSPITAL_COMMUNITY)
Admission: EM | Admit: 2021-02-05 | Discharge: 2021-03-08 | DRG: 180 | Disposition: E | Payer: Medicare Other | Attending: Internal Medicine | Admitting: Internal Medicine

## 2021-02-05 DIAGNOSIS — M069 Rheumatoid arthritis, unspecified: Secondary | ICD-10-CM | POA: Diagnosis present

## 2021-02-05 DIAGNOSIS — D649 Anemia, unspecified: Secondary | ICD-10-CM | POA: Diagnosis not present

## 2021-02-05 DIAGNOSIS — Z96653 Presence of artificial knee joint, bilateral: Secondary | ICD-10-CM | POA: Diagnosis present

## 2021-02-05 DIAGNOSIS — Z801 Family history of malignant neoplasm of trachea, bronchus and lung: Secondary | ICD-10-CM

## 2021-02-05 DIAGNOSIS — J45901 Unspecified asthma with (acute) exacerbation: Secondary | ICD-10-CM | POA: Diagnosis not present

## 2021-02-05 DIAGNOSIS — R0602 Shortness of breath: Secondary | ICD-10-CM | POA: Diagnosis not present

## 2021-02-05 DIAGNOSIS — Z8249 Family history of ischemic heart disease and other diseases of the circulatory system: Secondary | ICD-10-CM

## 2021-02-05 DIAGNOSIS — J441 Chronic obstructive pulmonary disease with (acute) exacerbation: Secondary | ICD-10-CM | POA: Diagnosis present

## 2021-02-05 DIAGNOSIS — I110 Hypertensive heart disease with heart failure: Secondary | ICD-10-CM | POA: Diagnosis present

## 2021-02-05 DIAGNOSIS — J9600 Acute respiratory failure, unspecified whether with hypoxia or hypercapnia: Secondary | ICD-10-CM | POA: Diagnosis not present

## 2021-02-05 DIAGNOSIS — I10 Essential (primary) hypertension: Secondary | ICD-10-CM | POA: Diagnosis not present

## 2021-02-05 DIAGNOSIS — C4372 Malignant melanoma of left lower limb, including hip: Secondary | ICD-10-CM | POA: Diagnosis present

## 2021-02-05 DIAGNOSIS — I480 Paroxysmal atrial fibrillation: Secondary | ICD-10-CM | POA: Diagnosis present

## 2021-02-05 DIAGNOSIS — R918 Other nonspecific abnormal finding of lung field: Secondary | ICD-10-CM | POA: Diagnosis present

## 2021-02-05 DIAGNOSIS — I2510 Atherosclerotic heart disease of native coronary artery without angina pectoris: Secondary | ICD-10-CM | POA: Diagnosis present

## 2021-02-05 DIAGNOSIS — C7801 Secondary malignant neoplasm of right lung: Secondary | ICD-10-CM | POA: Diagnosis not present

## 2021-02-05 DIAGNOSIS — E8809 Other disorders of plasma-protein metabolism, not elsewhere classified: Secondary | ICD-10-CM | POA: Diagnosis present

## 2021-02-05 DIAGNOSIS — L89302 Pressure ulcer of unspecified buttock, stage 2: Secondary | ICD-10-CM | POA: Diagnosis present

## 2021-02-05 DIAGNOSIS — I959 Hypotension, unspecified: Secondary | ICD-10-CM | POA: Diagnosis not present

## 2021-02-05 DIAGNOSIS — K219 Gastro-esophageal reflux disease without esophagitis: Secondary | ICD-10-CM | POA: Diagnosis present

## 2021-02-05 DIAGNOSIS — J9811 Atelectasis: Secondary | ICD-10-CM | POA: Diagnosis not present

## 2021-02-05 DIAGNOSIS — J9601 Acute respiratory failure with hypoxia: Secondary | ICD-10-CM | POA: Diagnosis not present

## 2021-02-05 DIAGNOSIS — L03114 Cellulitis of left upper limb: Secondary | ICD-10-CM | POA: Diagnosis present

## 2021-02-05 DIAGNOSIS — Z86711 Personal history of pulmonary embolism: Secondary | ICD-10-CM

## 2021-02-05 DIAGNOSIS — Z86718 Personal history of other venous thrombosis and embolism: Secondary | ICD-10-CM

## 2021-02-05 DIAGNOSIS — B974 Respiratory syncytial virus as the cause of diseases classified elsewhere: Secondary | ICD-10-CM | POA: Diagnosis present

## 2021-02-05 DIAGNOSIS — Z923 Personal history of irradiation: Secondary | ICD-10-CM | POA: Diagnosis not present

## 2021-02-05 DIAGNOSIS — I252 Old myocardial infarction: Secondary | ICD-10-CM | POA: Diagnosis not present

## 2021-02-05 DIAGNOSIS — Z7189 Other specified counseling: Secondary | ICD-10-CM | POA: Diagnosis not present

## 2021-02-05 DIAGNOSIS — G894 Chronic pain syndrome: Secondary | ICD-10-CM | POA: Diagnosis not present

## 2021-02-05 DIAGNOSIS — C7800 Secondary malignant neoplasm of unspecified lung: Secondary | ICD-10-CM | POA: Diagnosis not present

## 2021-02-05 DIAGNOSIS — C7802 Secondary malignant neoplasm of left lung: Secondary | ICD-10-CM | POA: Diagnosis present

## 2021-02-05 DIAGNOSIS — Z20822 Contact with and (suspected) exposure to covid-19: Secondary | ICD-10-CM | POA: Diagnosis present

## 2021-02-05 DIAGNOSIS — F419 Anxiety disorder, unspecified: Secondary | ICD-10-CM | POA: Diagnosis present

## 2021-02-05 DIAGNOSIS — Z66 Do not resuscitate: Secondary | ICD-10-CM | POA: Diagnosis present

## 2021-02-05 DIAGNOSIS — Z885 Allergy status to narcotic agent status: Secondary | ICD-10-CM

## 2021-02-05 DIAGNOSIS — Z96612 Presence of left artificial shoulder joint: Secondary | ICD-10-CM | POA: Diagnosis present

## 2021-02-05 DIAGNOSIS — C439 Malignant melanoma of skin, unspecified: Secondary | ICD-10-CM | POA: Diagnosis present

## 2021-02-05 DIAGNOSIS — Z955 Presence of coronary angioplasty implant and graft: Secondary | ICD-10-CM

## 2021-02-05 DIAGNOSIS — I509 Heart failure, unspecified: Secondary | ICD-10-CM | POA: Diagnosis present

## 2021-02-05 DIAGNOSIS — Z7901 Long term (current) use of anticoagulants: Secondary | ICD-10-CM

## 2021-02-05 DIAGNOSIS — R54 Age-related physical debility: Secondary | ICD-10-CM | POA: Diagnosis present

## 2021-02-05 DIAGNOSIS — Z515 Encounter for palliative care: Secondary | ICD-10-CM

## 2021-02-05 DIAGNOSIS — Z96611 Presence of right artificial shoulder joint: Secondary | ICD-10-CM | POA: Diagnosis present

## 2021-02-05 DIAGNOSIS — M05711 Rheumatoid arthritis with rheumatoid factor of right shoulder without organ or systems involvement: Secondary | ICD-10-CM | POA: Diagnosis not present

## 2021-02-05 DIAGNOSIS — J9621 Acute and chronic respiratory failure with hypoxia: Secondary | ICD-10-CM | POA: Diagnosis present

## 2021-02-05 DIAGNOSIS — Z881 Allergy status to other antibiotic agents status: Secondary | ICD-10-CM

## 2021-02-05 DIAGNOSIS — C4922 Malignant neoplasm of connective and soft tissue of left lower limb, including hip: Secondary | ICD-10-CM | POA: Diagnosis not present

## 2021-02-05 DIAGNOSIS — R059 Cough, unspecified: Secondary | ICD-10-CM | POA: Diagnosis not present

## 2021-02-05 DIAGNOSIS — Z981 Arthrodesis status: Secondary | ICD-10-CM

## 2021-02-05 DIAGNOSIS — G8929 Other chronic pain: Secondary | ICD-10-CM | POA: Diagnosis present

## 2021-02-05 DIAGNOSIS — R911 Solitary pulmonary nodule: Secondary | ICD-10-CM | POA: Diagnosis not present

## 2021-02-05 DIAGNOSIS — Z79899 Other long term (current) drug therapy: Secondary | ICD-10-CM

## 2021-02-05 DIAGNOSIS — C7989 Secondary malignant neoplasm of other specified sites: Secondary | ICD-10-CM | POA: Diagnosis not present

## 2021-02-05 DIAGNOSIS — I4510 Unspecified right bundle-branch block: Secondary | ICD-10-CM | POA: Diagnosis not present

## 2021-02-05 DIAGNOSIS — B338 Other specified viral diseases: Secondary | ICD-10-CM | POA: Diagnosis present

## 2021-02-05 DIAGNOSIS — Z800 Family history of malignant neoplasm of digestive organs: Secondary | ICD-10-CM

## 2021-02-05 DIAGNOSIS — R001 Bradycardia, unspecified: Secondary | ICD-10-CM | POA: Diagnosis not present

## 2021-02-05 DIAGNOSIS — G893 Neoplasm related pain (acute) (chronic): Secondary | ICD-10-CM | POA: Diagnosis not present

## 2021-02-05 DIAGNOSIS — R0902 Hypoxemia: Secondary | ICD-10-CM | POA: Diagnosis not present

## 2021-02-05 LAB — CBC WITH DIFFERENTIAL/PLATELET
Abs Immature Granulocytes: 0.02 10*3/uL (ref 0.00–0.07)
Basophils Absolute: 0 10*3/uL (ref 0.0–0.1)
Basophils Relative: 1 %
Eosinophils Absolute: 0 10*3/uL (ref 0.0–0.5)
Eosinophils Relative: 0 %
HCT: 29.7 % — ABNORMAL LOW (ref 39.0–52.0)
Hemoglobin: 9 g/dL — ABNORMAL LOW (ref 13.0–17.0)
Immature Granulocytes: 0 %
Lymphocytes Relative: 19 %
Lymphs Abs: 0.9 10*3/uL (ref 0.7–4.0)
MCH: 29.5 pg (ref 26.0–34.0)
MCHC: 30.3 g/dL (ref 30.0–36.0)
MCV: 97.4 fL (ref 80.0–100.0)
Monocytes Absolute: 0.3 10*3/uL (ref 0.1–1.0)
Monocytes Relative: 7 %
Neutro Abs: 3.4 10*3/uL (ref 1.7–7.7)
Neutrophils Relative %: 73 %
Platelets: 138 10*3/uL — ABNORMAL LOW (ref 150–400)
RBC: 3.05 MIL/uL — ABNORMAL LOW (ref 4.22–5.81)
RDW: 15.3 % (ref 11.5–15.5)
WBC: 4.7 10*3/uL (ref 4.0–10.5)
nRBC: 0 % (ref 0.0–0.2)

## 2021-02-05 LAB — URINALYSIS, ROUTINE W REFLEX MICROSCOPIC
Bilirubin Urine: NEGATIVE
Glucose, UA: NEGATIVE mg/dL
Hgb urine dipstick: NEGATIVE
Ketones, ur: NEGATIVE mg/dL
Leukocytes,Ua: NEGATIVE
Nitrite: NEGATIVE
Protein, ur: NEGATIVE mg/dL
Specific Gravity, Urine: 1.025 (ref 1.005–1.030)
pH: 5 (ref 5.0–8.0)

## 2021-02-05 LAB — RESP PANEL BY RT-PCR (FLU A&B, COVID) ARPGX2
Influenza A by PCR: NEGATIVE
Influenza B by PCR: NEGATIVE
SARS Coronavirus 2 by RT PCR: NEGATIVE

## 2021-02-05 LAB — COMPREHENSIVE METABOLIC PANEL
ALT: 18 U/L (ref 0–44)
AST: 24 U/L (ref 15–41)
Albumin: 2.9 g/dL — ABNORMAL LOW (ref 3.5–5.0)
Alkaline Phosphatase: 40 U/L (ref 38–126)
Anion gap: 4 — ABNORMAL LOW (ref 5–15)
BUN: 18 mg/dL (ref 8–23)
CO2: 34 mmol/L — ABNORMAL HIGH (ref 22–32)
Calcium: 7.9 mg/dL — ABNORMAL LOW (ref 8.9–10.3)
Chloride: 95 mmol/L — ABNORMAL LOW (ref 98–111)
Creatinine, Ser: 0.89 mg/dL (ref 0.61–1.24)
GFR, Estimated: >60 mL/min (ref >60)
Glucose, Bld: 103 mg/dL — ABNORMAL HIGH (ref 70–99)
Potassium: 4 mmol/L (ref 3.5–5.1)
Sodium: 133 mmol/L — ABNORMAL LOW (ref 135–145)
Total Bilirubin: 0.7 mg/dL (ref 0.3–1.2)
Total Protein: 5.3 g/dL — ABNORMAL LOW (ref 6.5–8.1)

## 2021-02-05 LAB — PROTIME-INR
INR: 1.9 — ABNORMAL HIGH (ref 0.8–1.2)
Prothrombin Time: 21.8 seconds — ABNORMAL HIGH (ref 11.4–15.2)

## 2021-02-05 LAB — LACTIC ACID, PLASMA: Lactic Acid, Venous: 1.3 mmol/L (ref 0.5–1.9)

## 2021-02-05 LAB — APTT: aPTT: 42 seconds — ABNORMAL HIGH (ref 24–36)

## 2021-02-05 LAB — PROCALCITONIN: Procalcitonin: <0.1 ng/mL

## 2021-02-05 MED ORDER — DEXAMETHASONE SODIUM PHOSPHATE 10 MG/ML IJ SOLN
10.0000 mg | Freq: Once | INTRAMUSCULAR | Status: AC
  Administered 2021-02-05: 10 mg via INTRAVENOUS
  Filled 2021-02-05: qty 1

## 2021-02-05 MED ORDER — VITAMIN B-12 1000 MCG PO TABS
1000.0000 ug | ORAL_TABLET | Freq: Every day | ORAL | Status: DC
  Administered 2021-02-05 – 2021-02-08 (×4): 1000 ug via ORAL
  Filled 2021-02-05 (×4): qty 1

## 2021-02-05 MED ORDER — ALBUTEROL (5 MG/ML) CONTINUOUS INHALATION SOLN
10.0000 mg/h | INHALATION_SOLUTION | Freq: Once | RESPIRATORY_TRACT | Status: AC
  Administered 2021-02-05: 10 mg/h via RESPIRATORY_TRACT

## 2021-02-05 MED ORDER — IBUPROFEN 200 MG PO TABS: 400.0000 mg | ORAL_TABLET | Freq: Four times a day (QID) | ORAL | Status: DC | PRN

## 2021-02-05 MED ORDER — IPRATROPIUM-ALBUTEROL 0.5-2.5 (3) MG/3ML IN SOLN
3.0000 mL | Freq: Four times a day (QID) | RESPIRATORY_TRACT | Status: DC
  Administered 2021-02-05: 3 mL via RESPIRATORY_TRACT
  Filled 2021-02-05: qty 3

## 2021-02-05 MED ORDER — LACTATED RINGERS IV SOLN: INTRAVENOUS | Status: AC

## 2021-02-05 MED ORDER — TAMSULOSIN HCL 0.4 MG PO CAPS
0.4000 mg | ORAL_CAPSULE | Freq: Every day | ORAL | Status: DC
  Administered 2021-02-05 – 2021-02-07 (×3): 0.4 mg via ORAL
  Filled 2021-02-05 (×3): qty 1

## 2021-02-05 MED ORDER — GUAIFENESIN ER 600 MG PO TB12
1200.0000 mg | ORAL_TABLET | Freq: Two times a day (BID) | ORAL | Status: DC
  Administered 2021-02-05 – 2021-02-08 (×7): 1200 mg via ORAL
  Filled 2021-02-05 (×8): qty 2

## 2021-02-05 MED ORDER — APIXABAN 5 MG PO TABS
5.0000 mg | ORAL_TABLET | Freq: Two times a day (BID) | ORAL | Status: DC
  Administered 2021-02-05 – 2021-02-08 (×7): 5 mg via ORAL
  Filled 2021-02-05 (×7): qty 1

## 2021-02-05 MED ORDER — MELATONIN 3 MG PO TABS
3.0000 mg | ORAL_TABLET | Freq: Every day | ORAL | Status: DC
  Administered 2021-02-05 – 2021-02-08 (×4): 3 mg via ORAL
  Filled 2021-02-05 (×4): qty 1

## 2021-02-05 MED ORDER — LORAZEPAM 1 MG PO TABS: 1.0000 mg | ORAL_TABLET | Freq: Three times a day (TID) | ORAL | Status: DC | PRN

## 2021-02-05 MED ORDER — ALBUTEROL SULFATE (2.5 MG/3ML) 0.083% IN NEBU: 2.5000 mg | INHALATION_SOLUTION | RESPIRATORY_TRACT | Status: DC | PRN

## 2021-02-05 MED ORDER — GABAPENTIN 300 MG PO CAPS
600.0000 mg | ORAL_CAPSULE | Freq: Every day | ORAL | Status: DC
  Administered 2021-02-05 – 2021-02-07 (×3): 600 mg via ORAL
  Filled 2021-02-05 (×3): qty 2

## 2021-02-05 MED ORDER — ALBUTEROL SULFATE HFA 108 (90 BASE) MCG/ACT IN AERS
4.0000 | INHALATION_SPRAY | RESPIRATORY_TRACT | Status: AC
  Administered 2021-02-05 (×2): 4 via RESPIRATORY_TRACT
  Filled 2021-02-05: qty 6.7

## 2021-02-05 MED ORDER — CHLORHEXIDINE GLUCONATE CLOTH 2 % EX PADS
6.0000 | MEDICATED_PAD | Freq: Every day | CUTANEOUS | Status: DC
  Administered 2021-02-06: 6 via TOPICAL

## 2021-02-05 MED ORDER — FLUTICASONE PROPIONATE 50 MCG/ACT NA SUSP
2.0000 | Freq: Every day | NASAL | Status: DC
  Administered 2021-02-05 – 2021-02-08 (×4): 2 via NASAL
  Filled 2021-02-05: qty 16

## 2021-02-05 MED ORDER — LORAZEPAM 2 MG/ML IJ SOLN: 1.0000 mg | Freq: Three times a day (TID) | INTRAMUSCULAR | Status: DC | PRN

## 2021-02-05 MED ORDER — IOHEXOL 350 MG/ML SOLN
75.0000 mL | Freq: Once | INTRAVENOUS | Status: AC | PRN
  Administered 2021-02-05: 75 mL via INTRAVENOUS

## 2021-02-05 MED ORDER — PREDNISONE 5 MG PO TABS
10.0000 mg | ORAL_TABLET | Freq: Every day | ORAL | Status: DC
  Filled 2021-02-05: qty 2

## 2021-02-05 MED ORDER — LORAZEPAM 1 MG PO TABS
1.0000 mg | ORAL_TABLET | Freq: Once | ORAL | Status: AC
  Administered 2021-02-05: 1 mg via ORAL
  Filled 2021-02-05: qty 1

## 2021-02-05 MED ORDER — SODIUM CHLORIDE (PF) 0.9 % IJ SOLN
INTRAMUSCULAR | Status: AC
  Administered 2021-02-05: 10 mL
  Filled 2021-02-05: qty 50

## 2021-02-05 MED ORDER — PANTOPRAZOLE SODIUM 40 MG PO TBEC
40.0000 mg | DELAYED_RELEASE_TABLET | Freq: Two times a day (BID) | ORAL | Status: DC
  Administered 2021-02-05 – 2021-02-08 (×7): 40 mg via ORAL
  Filled 2021-02-05 (×7): qty 1

## 2021-02-05 MED ORDER — VITAMIN D 25 MCG (1000 UNIT) PO TABS
2000.0000 [IU] | ORAL_TABLET | Freq: Every day | ORAL | Status: DC
  Administered 2021-02-05 – 2021-02-08 (×4): 2000 [IU] via ORAL
  Filled 2021-02-05 (×4): qty 2

## 2021-02-05 MED ORDER — SODIUM CHLORIDE 0.9 % IV BOLUS
500.0000 mL | Freq: Once | INTRAVENOUS | Status: AC
  Administered 2021-02-05: 500 mL via INTRAVENOUS

## 2021-02-05 MED ORDER — POLYETHYLENE GLYCOL 3350 17 G PO PACK
17.0000 g | PACK | Freq: Two times a day (BID) | ORAL | Status: DC
  Administered 2021-02-08: 17 g via ORAL
  Filled 2021-02-05 (×5): qty 1

## 2021-02-05 MED ORDER — POLYVINYL ALCOHOL 1.4 % OP SOLN
1.0000 [drp] | Freq: Every day | OPHTHALMIC | Status: DC | PRN
  Filled 2021-02-05: qty 15

## 2021-02-05 MED ORDER — ALBUTEROL SULFATE (2.5 MG/3ML) 0.083% IN NEBU
INHALATION_SOLUTION | RESPIRATORY_TRACT | Status: AC
  Administered 2021-02-05: 2.5 mg
  Filled 2021-02-05: qty 12

## 2021-02-05 MED ORDER — SODIUM CHLORIDE 0.9 % IV BOLUS (SEPSIS): 1000.0000 mL | Freq: Once | INTRAVENOUS | Status: DC

## 2021-02-05 MED ORDER — AZITHROMYCIN 250 MG PO TABS
500.0000 mg | ORAL_TABLET | Freq: Once | ORAL | Status: AC
  Administered 2021-02-05: 500 mg via ORAL
  Filled 2021-02-05: qty 2

## 2021-02-05 MED ORDER — IPRATROPIUM-ALBUTEROL 0.5-2.5 (3) MG/3ML IN SOLN
3.0000 mL | Freq: Three times a day (TID) | RESPIRATORY_TRACT | Status: DC
  Administered 2021-02-06 – 2021-02-08 (×9): 3 mL via RESPIRATORY_TRACT
  Filled 2021-02-05 (×8): qty 3

## 2021-02-05 MED ORDER — HYDROMORPHONE HCL 2 MG PO TABS
2.0000 mg | ORAL_TABLET | Freq: Three times a day (TID) | ORAL | Status: DC | PRN
  Administered 2021-02-05 – 2021-02-06 (×2): 2 mg via ORAL
  Filled 2021-02-05 (×3): qty 1

## 2021-02-05 NOTE — ED Notes (Signed)
Patient noted to have labored respiration and bilateral wheezing. Mild crackles in the bases. Provider informed and fluid bolus order changed from 1064m to 5031m

## 2021-02-05 NOTE — ED Notes (Signed)
Patient much more calm. Breathing less labored. Oxygen saturation 93% on 3L via [place]. Spouse at bedside. No signs of distress.

## 2021-02-05 NOTE — ED Provider Notes (Signed)
[facility] DEPT Provider Note   CSN: [id number] Arrival date & time: 01/27/2021  3762     History Chief Complaint  Patient presents with   Nasal Congestion    [name] is a 75 y.o. male with a PMH of COPD, CAD, alcohol abuse, CHF, A-fib, PE, and rheumatoid arthritis. Patient reports about 1 week ago he started experiencing persistent nasal congestion, fatigue, runny nose, and a "wet" cough. He states that he has been going through "1 box of tissues a day" with dark green/brown nasal contents. He has also experienced body aches and nausea without vomiting. He states that all his symptoms have gotten worse over the past week, with the worst day being yesterday and causing SOB. He was on 2L nasal cannula at the rehab facility, but today he desaturated to 70% SPO2 and was placed on NRB at 5L to get sats up to 100%. He endorses fever and chills for the last couple of days but does not know his temperature recordings. He denies any vomiting or diarrhea. He denies any chest pain, stomach pain, dysuria, leg pain or leg swelling.   Patient denies any history of PE, DVT.   Past Medical History:  Diagnosis Date   Alcoholism /alcohol abuse    per family   Allergic rhinitis    Allergy    Anxiety    Bacterial infection    CAD (coronary artery disease)    minimal coronary plaque in the LAD and right coronary system. 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 7-06. Last nuclear stress 11-17-06 fixed anterior/ inferior defect, no inducible ischemia, EF 81%   Cataract    CHF (congestive heart failure) (HCC)    Chronic back pain    "all over back"   Chronic neck pain    COPD (chronic obstructive pulmonary disease) ([place])    Depression    Diverticulosis    DVT (deep venous thrombosis) ([place]) 06/2020   Dyspnea    with exertion   Dysrhythmia 01/24/2012   past hx. A.Fib x1 episode-responded to med.    Falls frequently    "since 02/2013" (06/16/2013)   Family history of cancer    Genetic testing 03/06/2017   Multi-Cancer panel (83 genes) @ [facility] - No pathogenic mutations detected   GERD (gastroesophageal reflux disease)    Goals of care, counseling/discussion 10/12/2020   Hemochromatosis    dx'd 14 yrs ago last ferritin Aug 11, 08 52 (22-322), Fe 136 ("I had 250 phlebotomies for that")   High cholesterol    hx   History of radiation therapy 11/06/2020   left inguinal thigh 10/10/2020-11/06/2020  Dr Gery Pray   Hx of colonic polyps    Hx of colonoscopy    Hypertension    Iron deficiency anemia due to chronic blood loss 12/27/2016   Malignant melanoma of knee, left (HCC)    Myocardial infarction [facility]) 2006   "related to catheterization"   Narcotic abuse ([place])    per family   Neuropathy    fingers and toes   Osteoarthritis    Peripheral edema    Bilateral ankles   PONV (postoperative nausea and vomiting)    Pulmonary embolism ([place]) 06/2020   RA (rheumatoid arthritis) ([place])     Patient Active Problem List   Diagnosis Date Noted   Metastatic melanoma to lung ([place]) 02/06/2021   Malignant neoplasm metastatic to both lungs (HCC)    Acute respiratory failure ([place])  01/14/2021   Palliative care by specialist    General weakness    Sepsis ([place]) 01/16/2021   Sepsis due to cellulitis ([place]) 01/15/2021   Prolonged QT interval 01/15/2021   Pulmonary nodules 01/15/2021   AKI (acute kidney injury) ([place]) 01/15/2021   Open thigh wound    Citrobacter infection    Pseudomonas infection    Pressure injury of skin 12/21/2020   Fall    Hyperbilirubinemia    Malignant melanoma ([place])    Cellulitis 12/20/2020   Blisters of multiple sites 11/05/2020   Goals of care, counseling/discussion 10/12/2020   Atherosclerosis of aorta ([place]) 07/24/2020   Symptomatic anemia 05/27/2020   Pulmonary embolus ([place]) 05/27/2020   Acute respiratory failure with hypoxia ([place]) 05/26/2020   Callus of foot  04/21/2018   Edema 03/27/2018   Toxic gastroenteritis and colitis 03/25/2018   Other ulcerative 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 ([place]) 98/33/8250   Diarrhea 12/23/2017   Nausea & vomiting 12/23/2017   Herpes zoster 07/07/2017   Constipation 04/21/2017   Genetic testing 03/06/2017   Family history of cancer    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[place]) 02/12/2017   Iron deficiency anemia due to chronic blood loss 12/27/2016   Scalp wound 06/13/2016   Scalp laceration, subsequent encounter 06/04/2016   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   Pain in joint, ankle and foot 09/28/2015   Anemia due to chronic blood loss 09/28/2015   Hyponatremia 09/28/2015   Cramp in limb 09/28/2015   Exertional angina (HCC) 06/17/2014   DOE (dyspnea on exertion) 04/28/2014   Hypokalemia 03/11/2014   Counseling regarding goals of care 12/31/2013   Impaired glucose tolerance 12/31/2013   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[place]) 11/18/2013   Fever 11/18/2013   Leukocytosis 10/30/2013   Fatigue 10/22/2013   Hoarseness of voice 08/10/2013   Sleep disorder 07/05/2013   Tension headache 07/01/2013   Polypharmacy 06/19/2013   Lumbar post-laminectomy syndrome 05/27/2013   Degenerative arthritis of spine 05/26/2013   Hemochromatosis 05/26/2013   Wound infection after surgery 05/26/2013   Normocytic anemia 05/26/2013   Unintentional weight loss 05/26/2013   Protein-calorie malnutrition, severe ([place]) 05/25/2013   Chronic pain syndrome 05/23/2013   COPD (chronic obstructive pulmonary disease) ([place]) 05/06/2013   Frequent falls 05/06/2013   Encounter for postoperative wound care 05/06/2013   Dehydration 03/17/2013   Atrial fibrillation ([place]) 08/07/2010   Rheumatoid arthritis ([place]) 10/18/2008   Anxiety 11/09/2007   Elevated lipids 03/30/2007   PSEUDOGOUT 03/30/2007   Essential hypertension 03/30/2007   Coronary atherosclerosis 03/30/2007    Past Surgical History:  Procedure Laterality Date   ABDOMINAL  ADHESION SURGERY  ~ [place] Right 6-09   [facility]   APPENDECTOMY  ~ [place] Left 03/27/2017   Procedure: ASPIRATION OF SEROMA;  Surgeon: Stark Klein, MD;  Location: [facility];  Service: General;  Laterality: Left;   BONE TUMOR RESECTION  ~ 1954   "taken off my mastoid"   [place] Right 1990's   CATARACT EXTRACTION, BILATERAL Bilateral 01-24-12   CORONARY ANGIOPLASTY WITH STENT PLACEMENT  2006   "while repairing 1st stent, a second area tore and they had to place 2nd stent " ?LAD & CX   CORONARY ANGIOPLASTY WITH STENT PLACEMENT  06/17/2014   CYST EXCISION  "several OR's"   "backX 2, back of my neck, face, inside right bicept, chest, wrist"   FOOT SURGERY Right 11-08   for removal of bone spurs-   HAMMER TOE  SURGERY Right 07/2012   "broke 4 hammertoes"    HARDWARE REMOVAL  03/09/2012   Procedure: HARDWARE REMOVAL;  Surgeon: Nita Sells, MD;  Location: [facility];  Service: Orthopedics;  Laterality: Left;  Hardware Removal from Left Shoulder   HARVEST BONE GRAFT  02/06/2012   Procedure: HARVEST ILIAC BONE GRAFT;  Surgeon: Nita Sells, MD;  Location: [facility];  Service: Orthopedics;;  bone marrow aspirqation    INGUINAL HERNIA REPAIR Bilateral    JOINT REPLACEMENT     KNEE SURGERY Left ~ 2003   "6-12 months after uni knee removed synovial sack"   LEFT HEART CATHETERIZATION WITH CORONARY ANGIOGRAM N/A 06/17/2014   PCI of diffuse severe stenosis in the proximal to mid LAD using overlapping drug-eluting stents.   LUMBAR WOUND DEBRIDEMENT N/A 03/17/2013   Procedure: Incision and drainage of superficial lumbar wound;  Surgeon: Floyce Stakes, MD;  Location: [facility];  Service: Neurosurgery;  Laterality: N/A;  Incision and drainage of superficial lumbar wound   LYMPH NODE BIOPSY N/A 08/15/2020   Procedure: EXCISIONAL LYMPH NODE BIOPSY LEFT INGUINAL REGION, EXCISION OF LEFT THIGH NODULE,  EXCISION OF LEFT ABDOMINAL WALL LESION;  Surgeon: Stark Klein, MD;  Location: [facility];  Service: General;  Laterality: N/A;   MECKEL DIVERTICULUM EXCISION  ~ [place] LYMPH NODE BIOPSY Left 02/12/2017   WIDE LOCAL EXCISION LEFT KNEE MELANOMA, ADVANCEMENT FLAP CLOSURE, AND SENTINEL LYMPH NODE MAPPING AND BIOPSY.   MELANOMA EXCISION WITH SENTINEL LYMPH NODE BIOPSY Left 02/12/2017   Procedure: WIDE LOCAL EXCISION LEFT KNEE MELANOMA, ADVANCEMENT FLAP CLOSURE, AND SENTINEL LYMPH NODE MAPPING AND BIOPSY.;  Surgeon: Stark Klein, MD;  Location: [facility];  Service: General;  Laterality: Left;  GENERAL AND LOCAL   ORIF SHOULDER FRACTURE  02/06/2012   Procedure: OPEN REDUCTION INTERNAL FIXATION (ORIF) SHOULDER FRACTURE;  Surgeon: Nita Sells, MD;  Location: [facility];  Service: Orthopedics;  Laterality: Left;  ORIF of a Left Shoulder Fracture with  Iliac Crest Bone Graft aspiration    PORTACATH PLACEMENT Left 03/27/2017   Procedure: INSERTION PORT-A-CATH;  Surgeon: Stark Klein, MD;  Location: [facility];  Service: General;  Laterality: Left;   POSTERIOR LUMBAR FUSION  12-10   L4-5 diskectomy w/ fusion, cage placement and rods; [facility]   POSTERIOR LUMBAR FUSION 4 LEVEL N/A 03/02/2013   Procedure: Lumbar One to Sacral One Posterior lumbar interbody fusion;  Surgeon: Floyce Stakes, MD;  Location: [facility];  Service: Neurosurgery;  Laterality: N/A;  L1 to S1 Posterior lumbar interbody fusion   REPLACEMENT UNICONDYLAR JOINT KNEE Left ~ 2003   "~ 6 months after total knee replaced"   SHOULDER ARTHROSCOPY Left ~ 2004 X 2   "@ [facility]; left bone splinter in & had to clean it out"   TOTAL ANKLE REPLACEMENT Right 2008   at [facility] Bilateral 2002   TOTAL SHOULDER REPLACEMENT Left 2006   TOTAL SHOULDER REPLACEMENT Right ~ 2007   Dr. Marlou Sa       Family History  Problem Relation Age of Onset   Uterine cancer Mother        uterine  vs. cervix? dx 39s; deceased 58   Macular degeneration Mother    Other Mother        ankle edema   Lung cancer Mother    Melanoma Mother        multiple spots on legs   Cancer -  Lung Mother        smoker   Coronary artery disease Father    Hypertension Father    Prostate cancer Father    Colon polyps Father    Cancer - Prostate Father 30       deceased 94   Heart attack Brother    Hyperlipidemia Brother    Other Brother        Schizophrenic   Thyroid disease Sister    Hemochromatosis Sister    Rheum arthritis Sister    Coronary artery disease Maternal Aunt    Heart attack Maternal Aunt    Cancer Maternal Grandmother        unk. type; deceased 37s   Diabetes Neg Hx    Colon cancer Neg Hx    Esophageal cancer Neg Hx    Rectal cancer Neg Hx    Stomach cancer Neg Hx     Social History   Tobacco Use   Smoking status: Never   Smokeless tobacco: Never  Vaping Use   Vaping Use: Never used  Substance Use Topics   Alcohol use: No    Alcohol/week: 0.0 standard drinks    Comment: "I do not drink anymore"   Drug use: No    Home Medications Prior to Admission medications   Medication Sig Start Date End Date Taking? Authorizing Provider  apixaban (ELIQUIS) 5 MG TABS tablet Take 1 tablet (5 mg total) by mouth 2 (two) times daily. 06/06/20 01/09/2021 Yes Plotnikov, Evie Lacks, MD  Cholecalciferol (VITAMIN D) 50 MCG (2000 UT) tablet Take 2,000 Units by mouth daily.   Yes [provider]  Ensure (ENSURE) Take 237 mLs by mouth daily. Chocolate only   Yes [provider]  fluticasone (FLONASE) 50 MCG/ACT nasal spray INSTILL 2 SPRAYS IN EACH NOSTRIL EVERY DAY Patient taking differently: Place 2 sprays into both nostrils daily. 11/19/17  Yes Plotnikov, Evie Lacks, MD  gabapentin (NEURONTIN) 300 MG capsule Take 2 capsules (600 mg total) by mouth at bedtime. 11/02/20  Yes Plotnikov, Evie Lacks, MD  lidocaine-prilocaine (EMLA) cream Apply 1 application topically as  needed. Patient taking differently: Apply 1 application topically daily as needed (pain). 05/25/20  Yes Celso Amy, NP  melatonin 3 MG TABS tablet Take 1 tablet (3 mg total) by mouth at bedtime. 01/24/21 02/23/21 Yes Donne Hazel, MD  metoprolol tartrate (LOPRESSOR) 25 MG tablet TAKE 1 TABLET TWO TIMES DAILY. Patient taking differently: Take 25 mg by mouth 2 (two) times daily. 11/06/20  Yes Plotnikov, Evie Lacks, MD  pantoprazole (PROTONIX) 40 MG tablet Take 1 tablet (40 mg total) by mouth every evening. Patient taking differently: Take 40 mg by mouth 2 (two) times daily. 06/19/20  Yes Plotnikov, Evie Lacks, MD  polyethylene glycol (MIRALAX / GLYCOLAX) 17 g packet Take 17 g by mouth 2 (two) times daily.   Yes [provider]  polyvinyl alcohol (LIQUIFILM TEARS) 1.4 % ophthalmic solution Place 1 drop into both eyes daily as needed for dry eyes.   Yes [provider]  predniSONE (DELTASONE) 10 MG tablet Take 10 mg by mouth daily. 06/19/20  Yes [provider]  tamsulosin (FLOMAX) 0.4 MG CAPS capsule Take 1 capsule (0.4 mg total) by mouth daily. Patient taking differently: Take 0.4 mg by mouth at bedtime. 01/25/21 02/24/21 Yes Donne Hazel, MD  vitamin B-12 (CYANOCOBALAMIN) 1000 MCG tablet Take 1,000 mcg by mouth daily.    Yes [provider]  albuterol (PROVENTIL HFA;VENTOLIN HFA) 108 (90 Base)  MCG/ACT inhaler Inhale 1-2 puffs into the lungs every 4 (four) hours as needed for wheezing or shortness of breath. Patient not taking: Reported on 01/29/2021 09/11/16   Plotnikov, Evie Lacks, MD  HYDROmorphone (DILAUDID) 2 MG tablet Take 1-1.5 tablets (2-3 mg total) by mouth every 8 (eight) hours as needed for severe pain. Prescription needed for transfer to SNF Patient not taking: Reported on 01/24/2021 01/24/21   Donne Hazel, MD  mupirocin ointment Drue Stager) 2 % On leg wound w/dressing change qd or bid Patient not taking: Reported on 01/12/2021 11/02/20   Plotnikov,  Evie Lacks, MD    Allergies    Cefepime, Cephaeline, Morphine, Morphine and related, Penicillins, Doxycycline, and Oxycodone-acetaminophen  Review of Systems   Review of Systems  Constitutional:  Positive for activity change.  Respiratory:  Positive for cough and shortness of breath.   Cardiovascular:  Negative for chest pain.  Gastrointestinal:  Negative for nausea and vomiting.  All other systems reviewed and are negative.  Physical Exam Updated Vital Signs BP 137/71 (BP Location: Right Arm)   Pulse (!) 101   Temp (!) 97.5 F (36.4 C) (Oral)   Resp 20   Ht 5' 6"  (1.676 m)   Wt 61.7 kg   SpO2 97%   BMI 21.95 kg/m   Physical Exam Vitals and nursing note reviewed.  Constitutional:      Appearance: He is well-developed.  HENT:     Head: Atraumatic.  Cardiovascular:     Rate and Rhythm: Tachycardia present.  Pulmonary:     Breath sounds: Rhonchi present. No wheezing.     Comments: Tachypnea Musculoskeletal:     Cervical back: Neck supple.  Skin:    General: Skin is warm.  Neurological:     Mental Status: He is alert and oriented to person, place, and time.    ED Results / Procedures / Treatments   Labs (all labs ordered are listed, but only abnormal results are displayed) Labs Reviewed  RESPIRATORY PANEL BY PCR - Abnormal; Notable for the following components:      Result Value   Respiratory Syncytial Virus DETECTED (*)    All other components within normal limits  COMPREHENSIVE METABOLIC PANEL - Abnormal; Notable for the following components:   Sodium 133 (*)    Chloride 95 (*)    CO2 34 (*)    Glucose, Bld 103 (*)    Calcium 7.9 (*)    Total Protein 5.3 (*)    Albumin 2.9 (*)    Anion gap 4 (*)    All other components within normal limits  CBC WITH DIFFERENTIAL/PLATELET - Abnormal; Notable for the following components:   RBC 3.05 (*)    Hemoglobin 9.0 (*)    HCT 29.7 (*)    Platelets 138 (*)    All other components within normal limits  PROTIME-INR  - Abnormal; Notable for the following components:   Prothrombin Time 21.8 (*)    INR 1.9 (*)    All other components within normal limits  APTT - Abnormal; Notable for the following components:   aPTT 42 (*)    All other components within normal limits  URINALYSIS, ROUTINE W REFLEX MICROSCOPIC - Abnormal; Notable for the following components:   Color, Urine AMBER (*)    All other components within normal limits  BRAIN NATRIURETIC PEPTIDE - Abnormal; Notable for the following components:   B Natriuretic Peptide 883.1 (*)    All other components within normal limits  COMPREHENSIVE METABOLIC PANEL -  Abnormal; Notable for the following components:   Chloride 97 (*)    Glucose, Bld 102 (*)    Calcium 8.1 (*)    Total Protein 5.4 (*)    Albumin 2.9 (*)    All other components within normal limits  CBC - Abnormal; Notable for the following components:   RBC 3.06 (*)    Hemoglobin 8.9 (*)    HCT 29.4 (*)    Platelets 147 (*)    All other components within normal limits  RESP PANEL BY RT-PCR (FLU A&B, COVID) ARPGX2  CULTURE, BLOOD (ROUTINE X 2)  CULTURE, BLOOD (ROUTINE X 2)  LACTIC ACID, PLASMA  PROCALCITONIN  BASIC METABOLIC PANEL  MAGNESIUM  PREALBUMIN  LACTATE DEHYDROGENASE  IRON AND TIBC  CBC WITH DIFFERENTIAL/PLATELET  TYPE AND SCREEN    EKG EKG Interpretation  Date/Time:  Monday February 05 2021 10:34:29 EDT Ventricular Rate:  97 PR Interval:  176 QRS Duration: 87 QT Interval:  362 QTC Calculation: 460 R Axis:   112 Text Interpretation: Sinus rhythm Borderline low voltage, extremity leads Probable right ventricular hypertrophy No acute changes No significant change since last tracing Confirmed by Varney Biles [phone]) on 01/31/2021 10:53:28 AM  Radiology CT Angio Chest PE W and/or Wo Contrast  Result Date: 01/15/2021 CLINICAL DATA:  Hypoxia.  History of recurrent metastatic melanoma. EXAM: CT ANGIOGRAPHY CHEST WITH CONTRAST TECHNIQUE: Multidetector CT imaging of  the chest was performed using the standard protocol during bolus administration of intravenous contrast. Multiplanar CT image reconstructions and MIPs were obtained to evaluate the vascular anatomy. CONTRAST:  39m OMNIPAQUE IOHEXOL 350 MG/ML SOLN COMPARISON:  Prior CTA of the chest on 08/03/2020 FINDINGS: Cardiovascular: The pulmonary arteries are adequately opacified to the subsegmental level. There is no evidence of acute pulmonary embolism. Stable dilated central pulmonary arteries with the main pulmonary artery measuring up to 4.3 cm in diameter. The heart size is stable. No pericardial fluid identified. Stable extensive coronary atherosclerosis with evidence of prior coronary artery stent placement. The thoracic aorta demonstrates atherosclerosis without aneurysmal disease. Mediastinum/Nodes: No enlarged mediastinal, hilar, or axillary lymph nodes. Thyroid gland, trachea, and esophagus demonstrate no significant findings. Lungs/Pleura: Since the prior CT study, there are innumerable new pulmonary nodules throughout all lobes of both lungs. The most numerous nodules are in the upper lobes bilaterally with chronic volume loss present in the lower lobes bilaterally and increased atelectasis of the right lower lobe. Largest nodule diameter is approximately 7-8 mm in the left upper lobe. Findings are consistent with progressive pulmonary metastatic disease. No associated pleural fluid or pneumothorax. No pulmonary edema. Upper Abdomen: No visible metastatic disease in the visualized upper abdomen. Musculoskeletal: No visualized bony lesions or fractures. Stable diffuse degenerative disc disease of the thoracic spine. Review of the MIP images confirms the above findings. IMPRESSION: 1. No evidence of pulmonary embolism. 2. Innumerable new small pulmonary nodules throughout both lungs which were not present on the April CTA. Findings are consistent with progressive metastatic disease, most likely related to the  patient's known history of metastatic/recurrent malignant melanoma. 3. Increased atelectasis of the right lower lobe. 4. Stable dilated central pulmonary arteries consistent with component underlying pulmonary hypertension. Electronically Signed   By: GAletta EdouardM.D.   On: 01/25/2021 13:12   DG Chest Port 1 View  Result Date: 01/18/2021 CLINICAL DATA:  Cough, shortness of breath. EXAM: PORTABLE CHEST 1 VIEW COMPARISON:  January 16, 2021. FINDINGS: Stable cardiomediastinal silhouette. Status post bilateral shoulder arthroplasties. Left subclavian Port-A-Cath is  unchanged in position. Stable elevated right hemidiaphragm is noted. Stable bibasilar opacities are noted concerning for edema or infiltrates with possible small pleural effusions. IMPRESSION: Stable bibasilar opacities as described above. Aortic Atherosclerosis (ICD10-I70.0). Electronically Signed   By: Marijo Conception M.D.   On: 01/25/2021 10:11    Procedures .Critical Care Performed by: Varney Biles, MD Authorized by: Varney Biles, MD   Critical care provider statement:    Critical care time (minutes):  35   Critical care was necessary to treat or prevent imminent or life-threatening deterioration of the following conditions:  Respiratory failure   Critical care was time spent personally by me on the following activities:  Blood draw for specimens, development of treatment plan with patient or surrogate, discussions with consultants, discussions with primary provider, evaluation of patient's response to treatment, examination of patient, ordering and performing treatments and interventions, ordering and review of radiographic studies, ordering and review of laboratory studies, pulse oximetry, re-evaluation of patient's condition, review of old charts, transcutaneous pacing, ventilator management and vascular access procedures   I assumed direction of critical care for this patient from another provider in my specialty: yes      Medications Ordered in ED Medications  lactated ringers infusion (0 mLs Intravenous Stopped 02/06/21 0812)  guaiFENesin (MUCINEX) 12 hr tablet 1,200 mg (1,200 mg Oral Given 02/06/21 2108)  pantoprazole (PROTONIX) EC tablet 40 mg (40 mg Oral Given 02/06/21 2109)  polyethylene glycol (MIRALAX / GLYCOLAX) packet 17 g (17 g Oral Patient Refused/Not Given 02/06/21 2107)  tamsulosin (FLOMAX) capsule 0.4 mg (0.4 mg Oral Given 02/06/21 2109)  apixaban (ELIQUIS) tablet 5 mg (5 mg Oral Given 02/06/21 2108)  vitamin B-12 (CYANOCOBALAMIN) tablet 1,000 mcg (1,000 mcg Oral Given 02/06/21 1059)  melatonin tablet 3 mg (3 mg Oral Given 02/06/21 2108)  gabapentin (NEURONTIN) capsule 600 mg (600 mg Oral Given 02/06/21 2108)  cholecalciferol (VITAMIN D3) tablet 2,000 Units (2,000 Units Oral Given 02/06/21 1058)  fluticasone (FLONASE) 50 MCG/ACT nasal spray 2 spray (2 sprays Each Nare Given 02/06/21 1112)  polyvinyl alcohol (LIQUIFILM TEARS) 1.4 % ophthalmic solution 1 drop (has no administration in time range)  ipratropium-albuterol (DUONEB) 0.5-2.5 (3) MG/3ML nebulizer solution 3 mL (3 mLs Nebulization Given 02/06/21 1942)  albuterol (PROVENTIL) (2.5 MG/3ML) 0.083% nebulizer solution 2.5 mg (has no administration in time range)  HYDROmorphone (DILAUDID) tablet 2-3 mg (2 mg Oral Given 02/06/21 2300)  Chlorhexidine Gluconate Cloth 2 % PADS 6 each (6 each Topical Given 02/06/21 1103)  methylPREDNISolone sodium succinate (SOLU-MEDROL) 125 mg/2 mL injection 60 mg (60 mg Intravenous Given 02/06/21 2110)  loratadine (CLARITIN) tablet 10 mg (10 mg Oral Given 02/06/21 1059)  furosemide (LASIX) injection 20 mg (20 mg Intravenous Given 02/06/21 1110)  budesonide (PULMICORT) nebulizer solution 0.5 mg (0.5 mg Nebulization Given 02/06/21 1942)  protein supplement (ENSURE MAX) liquid (237 mLs Oral Given 02/06/21 2106)  metoprolol tartrate (LOPRESSOR) tablet 12.5 mg (has no administration in time range)  collagenase (SANTYL) ointment (  Topical Given 02/06/21 1439)  albuterol (VENTOLIN HFA) 108 (90 Base) MCG/ACT inhaler 4 puff (4 puffs Inhalation Given 01/20/2021 1117)  dexamethasone (DECADRON) injection 10 mg (10 mg Intravenous Given 01/25/2021 1055)  sodium chloride 0.9 % bolus 500 mL (0 mLs Intravenous Stopped 01/13/2021 1307)  albuterol (PROVENTIL,VENTOLIN) solution continuous neb (10 mg/hr Nebulization Given 01/09/2021 1320)  iohexol (OMNIPAQUE) 350 MG/ML injection 75 mL (75 mLs Intravenous Contrast Given 01/08/2021 1220)  sodium chloride (PF) 0.9 % injection (10 mLs  Given 01/22/2021 1315)  albuterol (PROVENTIL) (2.5 MG/3ML) 0.083% nebulizer solution (2.5 mg  Given 01/19/2021 1200)  azithromycin (ZITHROMAX) tablet 500 mg (500 mg Oral Given 01/24/2021 1637)  LORazepam (ATIVAN) tablet 1 mg (1 mg Oral Given 01/07/2021 1637)  LORazepam (ATIVAN) injection 0.25 mg (0.25 mg Intravenous Given 02/06/21 2157)    ED Course  I have reviewed the triage vital signs and the nursing notes.  Pertinent labs & imaging results that were available during my care of the patient were reviewed by me and considered in my medical decision making (see chart for details).  Clinical Course as of 02/07/21 0026  Mon Feb 05, 2021  1136 Lactic acid, plasma [MP]  1144 Resp Panel by RT-PCR (Flu A&B, Covid) Nasopharyngeal Swab Rest tori panel is negative for any acute viral illness that were checked for.  White count is normal.  Patient has been weaned down to 4 L per nasal cannula.  Still wheezing diffusely.  CT chest ordered primarily because of concerns for multifocal pneumonia that is not being picked up on the x-ray and also to ensure that patient does not have complicated, loculated fluid buildup.  For now, he is being treated as a COPD exacerbation. [AN]  37 Spoke with Dr. Burr Medico -about the pulmonary findings.  She will review the case and notify Dr. Marin Olp.  Patient getting hour-long breathing treatment right now.  From respiratory perspective, he has COPD  exacerbation that will benefit with admission to the hospital. [AN]    Clinical Course User Index [AN] Varney Biles, MD [MP] [place], Talitha Givens   MDM Rules/Calculators/A&P                           75 year old male comes in with chief complaint of worsening shortness of breath.  It appears that he has been having congestion, URI-like symptoms for the last several days.  Symptoms are progressing.  Breathing is gone harder.  He is having cough that is producing thick, yellow phlegm.  On exam, he has some rhonchorous breath sounds bilaterally in the lower lung fields.  No fevers here however patient is tachycardic.  He reports reduced p.o. intake for the last 2 or 3 days and has blood pressure in the 76L systolic.  At this time, we will initiate work-up for evolving sepsis. We have considered viral etiology including COVID-19, flu in the differential.  Given the recent admission, PE is also in the differential diagnosis although patient is not complaining of any pleuritic chest pain at this time, there is no signs of DVT, therefore the pretest probability for it is lower than the infectious etiologies.  Final Clinical Impression(s) / ED Diagnoses Final diagnoses:  Acute exacerbation of COPD with asthma ([place])  Malignant neoplasm metastatic to both lungs [facility])    Rx / DC Orders ED Discharge Orders     None        Varney Biles, MD 02/07/21 [id number]

## 2021-02-05 NOTE — ED Triage Notes (Signed)
"  Have been at rehab center completing therapy for cellulitis in legs, on oxygen for about 2 weeks via [place], this morning my nose was congested and the oxygen wasn't helping" per pt "Oxygen was in the upper 70s on [place], placed on NRB and oxygen up to 100%, BP was in the 35D systolic. Gave 525m bolus of NS" per EMS On arrival placed on simple mask at 5L. Oxygen remains 100%. Denies any other complaints.

## 2021-02-05 NOTE — ED Notes (Signed)
Patient continues to have increased work of breathing. Oxygen saturation 100%. Respiratory Therapy called to evaluate patient for recommendations.

## 2021-02-05 NOTE — H&P (Signed)
History and Physical    [name] NAT:[id number] DOB: [birth year] DOA: 2021-02-23  PCP: Tresa Garter, MD  Patient coming from: [facility]  Chief Complaint: Cough  HPI: [name] is a 75 y.o. male with medical history significant of malignant melanoma, afib, HTN, RA. Presenting with cough, dyspnea. He was at rehab and started having cough for the last 3 days. Productive cough. They tried giving him APAP and OTC cough meds. It did not help. Of note, he was on linezolid for cellulitis of his left arm during this time. His symptoms continue through this morning. The staff noted that he was hypoxic and hypotensive. He was transported to the ED for evaluation. He denies any other aggravating or alleviating factors.    ED Course: CTA chest showed diffuse metastatic lesions of the lung. She was started on nebs, steroids. TRH was called for admission.   Review of Systems: Review of systems is otherwise negative for all not mentioned in HPI.   PMHx Past Medical History:  Diagnosis Date   Alcoholism /alcohol abuse    per family   Allergic rhinitis    Allergy    Anxiety    Bacterial infection    CAD (coronary artery disease)    minimal coronary plaque in the LAD and right coronary system. 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 7-06. Last nuclear stress 11-17-06 fixed anterior/ inferior defect, no inducible ischemia, EF 81%   Cataract    CHF (congestive heart failure) (HCC)    Chronic back pain    "all over back"   Chronic neck pain    COPD (chronic obstructive pulmonary disease) (HCC)    Depression    Diverticulosis    DVT (deep venous thrombosis) (HCC) 06/2020   Dyspnea    with exertion   Dysrhythmia 01/24/2012   past hx. A.Fib x1 episode-responded to med.   Falls frequently    "since 02/2013" (06/16/2013)   Family history of cancer    Genetic testing 03/06/2017   Multi-Cancer panel (83 genes)  @ [facility] - No pathogenic mutations detected   GERD (gastroesophageal reflux disease)    Goals of care, counseling/discussion 10/12/2020   Hemochromatosis    dx'd 14 yrs ago last ferritin Aug 11, 08 52 (22-322), Fe 136 ("I had 250 phlebotomies for that")   High cholesterol    hx   History of radiation therapy 11/06/2020   left inguinal thigh 10/10/2020-11/06/2020  Dr Antony Blackbird   Hx of colonic polyps    Hx of colonoscopy    Hypertension    Iron deficiency anemia due to chronic blood loss 12/27/2016   Malignant melanoma of knee, left (HCC)    Myocardial infarction [facility]) 2006   "related to catheterization"   Narcotic abuse (HCC)    per family   Neuropathy    fingers and toes   Osteoarthritis    Peripheral edema    Bilateral ankles   PONV (postoperative nausea and vomiting)    Pulmonary embolism (HCC) 06/2020   RA (rheumatoid arthritis) (HCC)     PSHx Past Surgical History:  Procedure Laterality Date   ABDOMINAL ADHESION SURGERY  ~ 1968   ANKLE RECONSTRUCTION Right 6-09   [facility]   APPENDECTOMY  ~ 1956   ASPIRATION OF ABSCESS Left 03/27/2017   Procedure: ASPIRATION OF SEROMA;  Surgeon: Almond Lint, MD;  Location: [facility];  Service: General;  Laterality: Left;   BONE TUMOR RESECTION  ~  1954   "taken off my mastoid"   CARPAL TUNNEL RELEASE Right 1990's   CATARACT EXTRACTION, BILATERAL Bilateral 01-24-12   CORONARY ANGIOPLASTY WITH STENT PLACEMENT  2006   "while repairing 1st stent, a second area tore and they had to place 2nd stent " ?LAD & CX   CORONARY ANGIOPLASTY WITH STENT PLACEMENT  06/17/2014   CYST EXCISION  "several OR's"   "backX 2, back of my neck, face, inside right bicept, chest, wrist"   FOOT SURGERY Right 11-08   for removal of bone spurs-   HAMMER TOE SURGERY Right 07/2012   "broke 4 hammertoes"    HARDWARE REMOVAL  03/09/2012   Procedure: HARDWARE REMOVAL;  Surgeon: Mable Paris, MD;  Location: [facility];  Service:  Orthopedics;  Laterality: Left;  Hardware Removal from Left Shoulder   HARVEST BONE GRAFT  02/06/2012   Procedure: HARVEST ILIAC BONE GRAFT;  Surgeon: Mable Paris, MD;  Location: [facility];  Service: Orthopedics;;  bone marrow aspirqation    INGUINAL HERNIA REPAIR Bilateral    JOINT REPLACEMENT     KNEE SURGERY Left ~ 2003   "6-12 months after uni knee removed synovial sack"   LEFT HEART CATHETERIZATION WITH CORONARY ANGIOGRAM N/A 06/17/2014   PCI of diffuse severe stenosis in the proximal to mid LAD using overlapping drug-eluting stents.   LUMBAR WOUND DEBRIDEMENT N/A 03/17/2013   Procedure: Incision and drainage of superficial lumbar wound;  Surgeon: Karn Cassis, MD;  Location: [facility];  Service: Neurosurgery;  Laterality: N/A;  Incision and drainage of superficial lumbar wound   LYMPH NODE BIOPSY N/A 08/15/2020   Procedure: EXCISIONAL LYMPH NODE BIOPSY LEFT INGUINAL REGION, EXCISION OF LEFT THIGH NODULE, EXCISION OF LEFT ABDOMINAL WALL LESION;  Surgeon: Almond Lint, MD;  Location: [facility];  Service: General;  Laterality: N/A;   MECKEL DIVERTICULUM EXCISION  ~ 1956   MELANOMA EXCISION WITH SENTINEL LYMPH NODE BIOPSY Left 02/12/2017   WIDE LOCAL EXCISION LEFT KNEE MELANOMA, ADVANCEMENT FLAP CLOSURE, AND SENTINEL LYMPH NODE MAPPING AND BIOPSY.   MELANOMA EXCISION WITH SENTINEL LYMPH NODE BIOPSY Left 02/12/2017   Procedure: WIDE LOCAL EXCISION LEFT KNEE MELANOMA, ADVANCEMENT FLAP CLOSURE, AND SENTINEL LYMPH NODE MAPPING AND BIOPSY.;  Surgeon: Almond Lint, MD;  Location: [facility];  Service: General;  Laterality: Left;  GENERAL AND LOCAL   ORIF SHOULDER FRACTURE  02/06/2012   Procedure: OPEN REDUCTION INTERNAL FIXATION (ORIF) SHOULDER FRACTURE;  Surgeon: Mable Paris, MD;  Location: [facility];  Service: Orthopedics;  Laterality: Left;  ORIF of a Left Shoulder Fracture with  Iliac Crest Bone Graft aspiration    PORTACATH PLACEMENT Left 03/27/2017   Procedure: INSERTION  PORT-A-CATH;  Surgeon: Almond Lint, MD;  Location: [facility];  Service: General;  Laterality: Left;   POSTERIOR LUMBAR FUSION  12-10   L4-5 diskectomy w/ fusion, cage placement and rods; [place]   POSTERIOR LUMBAR FUSION 4 LEVEL N/A 03/02/2013   Procedure: Lumbar One to Sacral One Posterior lumbar interbody fusion;  Surgeon: Karn Cassis, MD;  Location: [facility];  Service: Neurosurgery;  Laterality: N/A;  L1 to S1 Posterior lumbar interbody fusion   REPLACEMENT UNICONDYLAR JOINT KNEE Left ~ 2003   "~ 6 months after total knee replaced"   SHOULDER ARTHROSCOPY Left ~ 2004 X 2   "@ [facility]; left bone splinter in & had to clean it out"   TOTAL ANKLE REPLACEMENT Right 2008   at [facility]   TOTAL KNEE  ARTHROPLASTY Bilateral 2002   TOTAL SHOULDER REPLACEMENT Left 2006   TOTAL SHOULDER REPLACEMENT Right ~ 2007   Dr. August Saucer    SocHx  reports that he has never smoked. He has never used smokeless tobacco. He reports that he does not drink alcohol and does not use drugs.  Allergies  Allergen Reactions   Cefepime Hives and Shortness Of Breath   Cephaeline Hives   Morphine Swelling    A swollen stomach.   Morphine And Related Shortness Of Breath, Nausea And Vomiting, Swelling and Other (See Comments)    Agitation, tolerates dilaudid Other reaction(s): Other (See Comments) Agitation, tolerates dilaudid   Penicillins Hives, Shortness Of Breath and Rash    Has patient had a PCN reaction causing immediate rash, facial/tongue/throat swelling, SOB or lightheadedness with hypotension: Yes Has patient had a PCN reaction causing severe rash involving mucus membranes or skin necrosis: Yes Has patient had a PCN reaction that required hospitalization No Has patient had a PCN reaction occurring within the last 10 years: No If all of the above answers are "NO", then may proceed with Cephalosporin use.    Doxycycline Rash   Oxycodone-Acetaminophen Other (See Comments)    Patient doesn't  remember what type of reaction.    FamHx Family History  Problem Relation Age of Onset   Uterine cancer Mother        uterine vs. cervix? dx 4s; deceased 43   Macular degeneration Mother    Other Mother        ankle edema   Lung cancer Mother    Melanoma Mother        multiple spots on legs   Cancer - Lung Mother        smoker   Coronary artery disease Father    Hypertension Father    Prostate cancer Father    Colon polyps Father    Cancer - Prostate Father 72       deceased 44   Heart attack Brother    Hyperlipidemia Brother    Other Brother        Schizophrenic   Thyroid disease Sister    Hemochromatosis Sister    Rheum arthritis Sister    Coronary artery disease Maternal Aunt    Heart attack Maternal Aunt    Cancer Maternal Grandmother        unk. type; deceased 23s   Diabetes Neg Hx    Colon cancer Neg Hx    Esophageal cancer Neg Hx    Rectal cancer Neg Hx    Stomach cancer Neg Hx     Prior to Admission medications   Medication Sig Start Date End Date Taking? Authorizing Provider  apixaban (ELIQUIS) 5 MG TABS tablet Take 1 tablet (5 mg total) by mouth 2 (two) times daily. 06/06/20 2021-02-21 Yes Plotnikov, Georgina Quint, MD  Cholecalciferol (VITAMIN D) 50 MCG (2000 UT) tablet Take 2,000 Units by mouth daily.   Yes [provider]  Ensure (ENSURE) Take 237 mLs by mouth daily. Chocolate only   Yes [provider]  fluticasone (FLONASE) 50 MCG/ACT nasal spray INSTILL 2 SPRAYS IN EACH NOSTRIL EVERY DAY Patient taking differently: Place 2 sprays into both nostrils daily. 11/19/17  Yes Plotnikov, Georgina Quint, MD  gabapentin (NEURONTIN) 300 MG capsule Take 2 capsules (600 mg total) by mouth at bedtime. 11/02/20  Yes Plotnikov, Georgina Quint, MD  lidocaine-prilocaine (EMLA) cream Apply 1 application topically as needed. Patient taking differently: Apply 1 application topically daily as needed (pain).  05/25/20  Yes Erenest Blank, NP  linezolid (ZYVOX) 600 MG tablet  Take 600 mg by mouth 2 (two) times daily. X 5 days  2021-02-18 Yes [provider]  melatonin 3 MG TABS tablet Take 1 tablet (3 mg total) by mouth at bedtime. 01/24/21 02/23/21 Yes Jerald Kief, MD  metoprolol tartrate (LOPRESSOR) 25 MG tablet TAKE 1 TABLET TWO TIMES DAILY. Patient taking differently: Take 25 mg by mouth 2 (two) times daily. 11/06/20  Yes Plotnikov, Georgina Quint, MD  pantoprazole (PROTONIX) 40 MG tablet Take 1 tablet (40 mg total) by mouth every evening. Patient taking differently: Take 40 mg by mouth 2 (two) times daily. 06/19/20  Yes Plotnikov, Georgina Quint, MD  polyethylene glycol (MIRALAX / GLYCOLAX) 17 g packet Take 17 g by mouth 2 (two) times daily.   Yes [provider]  polyvinyl alcohol (LIQUIFILM TEARS) 1.4 % ophthalmic solution Place 1 drop into both eyes daily as needed for dry eyes.   Yes [provider]  predniSONE (DELTASONE) 10 MG tablet Take 10 mg by mouth daily. 06/19/20  Yes [provider]  tamsulosin (FLOMAX) 0.4 MG CAPS capsule Take 1 capsule (0.4 mg total) by mouth daily. Patient taking differently: Take 0.4 mg by mouth at bedtime. 01/25/21 02/24/21 Yes Jerald Kief, MD  vitamin B-12 (CYANOCOBALAMIN) 1000 MCG tablet Take 1,000 mcg by mouth daily.    Yes [provider]  albuterol (PROVENTIL HFA;VENTOLIN HFA) 108 (90 Base) MCG/ACT inhaler Inhale 1-2 puffs into the lungs every 4 (four) hours as needed for wheezing or shortness of breath. Patient not taking: Reported on 02/18/2021 09/11/16   Plotnikov, Georgina Quint, MD  HYDROmorphone (DILAUDID) 2 MG tablet Take 1-1.5 tablets (2-3 mg total) by mouth every 8 (eight) hours as needed for severe pain. Prescription needed for transfer to SNF Patient not taking: Reported on 02/18/21 01/24/21   Jerald Kief, MD  mupirocin ointment [facility]) 2 % On leg wound w/dressing change qd or bid Patient not taking: Reported on 18-Feb-2021 11/02/20   Plotnikov, Georgina Quint, MD    Physical  Exam: Vitals:   18-Feb-2021 1200 02/18/2021 1305 Feb 18, 2021 1327 2021-02-18 1400  BP: (!) 99/59 (!) 106/59  116/65  Pulse: 97 98  98  Resp: 20 (!) 22  (!) 27  Temp:      TempSrc:      SpO2: 92% 95% 100% 100%  Weight:      Height:        General: 75 y.o. male resting in bed in NAD Eyes: PERRL, normal sclera ENMT: Nares patent w/o discharge, orophaynx clear, dentition normal, ears w/o discharge/lesions/ulcers Neck: Supple, trachea midline Cardiovascular: RRR, +S1, S2, no m/g/r, equal pulses throughout Respiratory: diffuse rhonchi, increased WOB on 3L GI: BS+, NDNT, no masses noted, no organomegaly noted MSK: No e/c/c Neuro: A&O x 3, no focal deficits Psyc: Appropriate interaction and affect, calm/cooperative  Labs on Admission: I have personally reviewed following labs and imaging studies  CBC: Recent Labs  Lab 02/18/21 1029  WBC 4.7  NEUTROABS 3.4  HGB 9.0*  HCT 29.7*  MCV 97.4  PLT 138*   Basic Metabolic Panel: Recent Labs  Lab 02/18/2021 1029  NA 133*  K 4.0  CL 95*  CO2 34*  GLUCOSE 103*  BUN 18  CREATININE 0.89  CALCIUM 7.9*   GFR: Estimated Creatinine Clearance: 64.5 mL/min (by C-G formula based on SCr of 0.89 mg/dL). Liver Function Tests: Recent Labs  Lab Feb 18, 2021 1029  AST 24  ALT 18  ALKPHOS 40  BILITOT 0.7  PROT 5.3*  ALBUMIN 2.9*   No results for input(s): LIPASE, AMYLASE in the last 168 hours. No results for input(s): AMMONIA in the last 168 hours. Coagulation Profile: Recent Labs  Lab 02/04/2021 1029  INR 1.9*   Cardiac Enzymes: No results for input(s): CKTOTAL, CKMB, CKMBINDEX, TROPONINI in the last 168 hours. BNP (last 3 results) No results for input(s): PROBNP in the last 8760 hours. HbA1C: No results for input(s): HGBA1C in the last 72 hours. CBG: No results for input(s): GLUCAP in the last 168 hours. Lipid Profile: No results for input(s): CHOL, HDL, LDLCALC, TRIG, CHOLHDL, LDLDIRECT in the last 72 hours. Thyroid Function Tests: No  results for input(s): TSH, T4TOTAL, FREET4, T3FREE, THYROIDAB in the last 72 hours. Anemia Panel: No results for input(s): VITAMINB12, FOLATE, FERRITIN, TIBC, IRON, RETICCTPCT in the last 72 hours. Urine analysis:    Component Value Date/Time   COLORURINE AMBER (A) 01/20/2021 1320   APPEARANCEUR CLEAR 01/27/2021 1320   LABSPEC 1.025 01/28/2021 1320   PHURINE 5.0 01/18/2021 1320   GLUCOSEU NEGATIVE 01/27/2021 1320   GLUCOSEU NEGATIVE 03/10/2015 1008   HGBUR NEGATIVE 01/26/2021 1320   BILIRUBINUR NEGATIVE 01/26/2021 1320   KETONESUR NEGATIVE 01/31/2021 1320   PROTEINUR NEGATIVE 01/09/2021 1320   UROBILINOGEN 0.2 03/10/2015 1008   NITRITE NEGATIVE 01/14/2021 1320   LEUKOCYTESUR NEGATIVE 01/17/2021 1320    Radiological Exams on Admission: CT Angio Chest PE W and/or Wo Contrast  Result Date: 01/12/2021 CLINICAL DATA:  Hypoxia.  History of recurrent metastatic melanoma. EXAM: CT ANGIOGRAPHY CHEST WITH CONTRAST TECHNIQUE: Multidetector CT imaging of the chest was performed using the standard protocol during bolus administration of intravenous contrast. Multiplanar CT image reconstructions and MIPs were obtained to evaluate the vascular anatomy. CONTRAST:  75mL OMNIPAQUE IOHEXOL 350 MG/ML SOLN COMPARISON:  Prior CTA of the chest on 08/03/2020 FINDINGS: Cardiovascular: The pulmonary arteries are adequately opacified to the subsegmental level. There is no evidence of acute pulmonary embolism. Stable dilated central pulmonary arteries with the main pulmonary artery measuring up to 4.3 cm in diameter. The heart size is stable. No pericardial fluid identified. Stable extensive coronary atherosclerosis with evidence of prior coronary artery stent placement. The thoracic aorta demonstrates atherosclerosis without aneurysmal disease. Mediastinum/Nodes: No enlarged mediastinal, hilar, or axillary lymph nodes. Thyroid gland, trachea, and esophagus demonstrate no significant findings. Lungs/Pleura: Since the  prior CT study, there are innumerable new pulmonary nodules throughout all lobes of both lungs. The most numerous nodules are in the upper lobes bilaterally with chronic volume loss present in the lower lobes bilaterally and increased atelectasis of the right lower lobe. Largest nodule diameter is approximately 7-8 mm in the left upper lobe. Findings are consistent with progressive pulmonary metastatic disease. No associated pleural fluid or pneumothorax. No pulmonary edema. Upper Abdomen: No visible metastatic disease in the visualized upper abdomen. Musculoskeletal: No visualized bony lesions or fractures. Stable diffuse degenerative disc disease of the thoracic spine. Review of the MIP images confirms the above findings. IMPRESSION: 1. No evidence of pulmonary embolism. 2. Innumerable new small pulmonary nodules throughout both lungs which were not present on the April CTA. Findings are consistent with progressive metastatic disease, most likely related to the patient's known history of metastatic/recurrent malignant melanoma. 3. Increased atelectasis of the right lower lobe. 4. Stable dilated central pulmonary arteries consistent with component underlying pulmonary hypertension. Electronically Signed   By: Irish Lack M.D.   On: 01/11/2021 13:12   DG  Chest Port 1 View  Result Date: 01/27/2021 CLINICAL DATA:  Cough, shortness of breath. EXAM: PORTABLE CHEST 1 VIEW COMPARISON:  January 16, 2021. FINDINGS: Stable cardiomediastinal silhouette. Status post bilateral shoulder arthroplasties. Left subclavian Port-A-Cath is unchanged in position. Stable elevated right hemidiaphragm is noted. Stable bibasilar opacities are noted concerning for edema or infiltrates with possible small pleural effusions. IMPRESSION: Stable bibasilar opacities as described above. Aortic Atherosclerosis (ICD10-I70.0). Electronically Signed   By: Lupita Raider M.D.   On: 01/12/2021 10:11    EKG: Independently reviewed. Sinus, no  st elevations  Assessment/Plan Metastatic lung disease; likely melanoma Acute hypoxic respiratory failure     - admit to inpt, progressive     - continue nebs, O2 support     - he's been on linezolid; CTA not showing clot or discrete infection     - procal is negative     - guaifenesin     - PRN ativan     - onco to see     - consult Palliative Care  pAfib on eliquis Hx of HTN     - in sinus; continue eliquis     - BP soft; hold metoprolol  Normocytic anemia     - no evidence of bleed; follow  Chronic pain     - pain regimen as BP tolerates  Anxiety     - PRN ativan  Rheumatoid arthritis     - resume home regimen  DVT prophylaxis: eliquis  Code Status: DNR  Family Communication: w/ wife at bedside  Consults called: Onco, palliative care  Status is: Inpatient  Remains inpatient appropriate because: severity of illness  Meiling Hendriks A Ronaldo Miyamoto DO [facility]  If 7PM-7AM, please contact night-coverage [email or web address]  01/31/2021, 2:53 PM

## 2021-02-06 DIAGNOSIS — C7801 Secondary malignant neoplasm of right lung: Secondary | ICD-10-CM | POA: Diagnosis not present

## 2021-02-06 DIAGNOSIS — G894 Chronic pain syndrome: Secondary | ICD-10-CM

## 2021-02-06 DIAGNOSIS — M05711 Rheumatoid arthritis with rheumatoid factor of right shoulder without organ or systems involvement: Secondary | ICD-10-CM

## 2021-02-06 DIAGNOSIS — C7989 Secondary malignant neoplasm of other specified sites: Secondary | ICD-10-CM

## 2021-02-06 DIAGNOSIS — I10 Essential (primary) hypertension: Secondary | ICD-10-CM | POA: Diagnosis not present

## 2021-02-06 DIAGNOSIS — C7800 Secondary malignant neoplasm of unspecified lung: Secondary | ICD-10-CM

## 2021-02-06 DIAGNOSIS — D649 Anemia, unspecified: Secondary | ICD-10-CM

## 2021-02-06 DIAGNOSIS — C4922 Malignant neoplasm of connective and soft tissue of left lower limb, including hip: Secondary | ICD-10-CM

## 2021-02-06 DIAGNOSIS — R0602 Shortness of breath: Secondary | ICD-10-CM | POA: Diagnosis not present

## 2021-02-06 DIAGNOSIS — C4372 Malignant melanoma of left lower limb, including hip: Secondary | ICD-10-CM | POA: Diagnosis not present

## 2021-02-06 DIAGNOSIS — J9601 Acute respiratory failure with hypoxia: Secondary | ICD-10-CM | POA: Diagnosis not present

## 2021-02-06 DIAGNOSIS — C7802 Secondary malignant neoplasm of left lung: Secondary | ICD-10-CM

## 2021-02-06 LAB — COMPREHENSIVE METABOLIC PANEL
ALT: 20 U/L (ref 0–44)
AST: 23 U/L (ref 15–41)
Albumin: 2.9 g/dL — ABNORMAL LOW (ref 3.5–5.0)
Alkaline Phosphatase: 43 U/L (ref 38–126)
Anion gap: 6 (ref 5–15)
BUN: 20 mg/dL (ref 8–23)
CO2: 32 mmol/L (ref 22–32)
Calcium: 8.1 mg/dL — ABNORMAL LOW (ref 8.9–10.3)
Chloride: 97 mmol/L — ABNORMAL LOW (ref 98–111)
Creatinine, Ser: 0.61 mg/dL (ref 0.61–1.24)
GFR, Estimated: >60 mL/min (ref >60)
Glucose, Bld: 102 mg/dL — ABNORMAL HIGH (ref 70–99)
Potassium: 4.4 mmol/L (ref 3.5–5.1)
Sodium: 135 mmol/L (ref 135–145)
Total Bilirubin: 0.5 mg/dL (ref 0.3–1.2)
Total Protein: 5.4 g/dL — ABNORMAL LOW (ref 6.5–8.1)

## 2021-02-06 LAB — CBC
HCT: 29.4 % — ABNORMAL LOW (ref 39.0–52.0)
Hemoglobin: 8.9 g/dL — ABNORMAL LOW (ref 13.0–17.0)
MCH: 29.1 pg (ref 26.0–34.0)
MCHC: 30.3 g/dL (ref 30.0–36.0)
MCV: 96.1 fL (ref 80.0–100.0)
Platelets: 147 10*3/uL — ABNORMAL LOW (ref 150–400)
RBC: 3.06 MIL/uL — ABNORMAL LOW (ref 4.22–5.81)
RDW: 15.3 % (ref 11.5–15.5)
WBC: 6 10*3/uL (ref 4.0–10.5)
nRBC: 0 % (ref 0.0–0.2)

## 2021-02-06 LAB — RESPIRATORY PANEL BY PCR

## 2021-02-06 LAB — BRAIN NATRIURETIC PEPTIDE: B Natriuretic Peptide: 883.1 pg/mL — ABNORMAL HIGH (ref 0.0–100.0)

## 2021-02-06 MED ORDER — COLLAGENASE 250 UNIT/GM EX OINT
TOPICAL_OINTMENT | Freq: Every day | CUTANEOUS | Status: DC
  Filled 2021-02-06: qty 30

## 2021-02-06 MED ORDER — METOPROLOL TARTRATE 25 MG PO TABS: 12.5000 mg | ORAL_TABLET | Freq: Two times a day (BID) | ORAL | Status: DC

## 2021-02-06 MED ORDER — METOPROLOL TARTRATE 25 MG PO TABS: 25.0000 mg | ORAL_TABLET | Freq: Two times a day (BID) | ORAL | Status: DC

## 2021-02-06 MED ORDER — METOPROLOL TARTRATE 25 MG PO TABS
12.5000 mg | ORAL_TABLET | Freq: Two times a day (BID) | ORAL | Status: DC
  Administered 2021-02-07 – 2021-02-08 (×4): 12.5 mg via ORAL
  Filled 2021-02-06 (×4): qty 1

## 2021-02-06 MED ORDER — LORATADINE 10 MG PO TABS
10.0000 mg | ORAL_TABLET | Freq: Every day | ORAL | Status: DC
  Administered 2021-02-06 – 2021-02-08 (×3): 10 mg via ORAL
  Filled 2021-02-06 (×3): qty 1

## 2021-02-06 MED ORDER — LORAZEPAM 2 MG/ML IJ SOLN
0.2500 mg | Freq: Once | INTRAMUSCULAR | Status: AC
  Administered 2021-02-06: 0.25 mg via INTRAVENOUS
  Filled 2021-02-06: qty 1

## 2021-02-06 MED ORDER — METHYLPREDNISOLONE SODIUM SUCC 125 MG IJ SOLR
60.0000 mg | Freq: Three times a day (TID) | INTRAMUSCULAR | Status: DC
  Administered 2021-02-06 – 2021-02-07 (×4): 60 mg via INTRAVENOUS
  Filled 2021-02-06 (×4): qty 2

## 2021-02-06 MED ORDER — ENSURE MAX PROTEIN PO LIQD
237.0000 mL | Freq: Three times a day (TID) | ORAL | Status: DC
  Administered 2021-02-06 (×2): 237 mL via ORAL
  Filled 2021-02-06 (×9): qty 330

## 2021-02-06 MED ORDER — BUDESONIDE 0.5 MG/2ML IN SUSP
0.5000 mg | Freq: Two times a day (BID) | RESPIRATORY_TRACT | Status: DC
  Administered 2021-02-06 – 2021-02-08 (×6): 0.5 mg via RESPIRATORY_TRACT
  Filled 2021-02-06 (×6): qty 2

## 2021-02-06 MED ORDER — FUROSEMIDE 10 MG/ML IJ SOLN
20.0000 mg | Freq: Every day | INTRAMUSCULAR | Status: DC
  Administered 2021-02-06 – 2021-02-08 (×3): 20 mg via INTRAVENOUS
  Filled 2021-02-06 (×3): qty 2

## 2021-02-06 NOTE — Progress Notes (Signed)
PT demonstrated hands on understanding of Flutter device. PC at this time.

## 2021-02-06 NOTE — Consult Note (Signed)
WOC Nurse Consult Note: Reason for Consult: inner thigh wound History of second wound that is healed Wound type: chronic; infectious  Pressure Injury POA: NA Measurement:left inner thigh; 0.3cm x 0.3cm x 0.5cm  Wound bed: 100% fibrinous Drainage (amount, consistency, odor) none Periwound: intact  Dressing procedure/placement/frequency: Enzymatic debridement ointment, saline moist 1/4" packing strip, top with foam. Change daily  Discussed POC with patient and bedside nurse.  Re consult if needed, will not follow at this time. Thanks  Khamia Stambaugh [employer], RN,CWOCN, CNS, [place] [phone])

## 2021-02-06 NOTE — Progress Notes (Signed)
PROGRESS NOTE    [name]  XBL:[id number] DOB: [birth year] DOA: 01/22/2021 PCP: Cassandria Anger, MD (Confirm with patient/family/NH records and if not entered, this HAS to be entered at [facility] point of entry. "No PCP" if truly none.)   Chief Complaint  Patient presents with   Nasal Congestion    Brief Narrative:  Patient is an unfortunate pleasant 75 year old gentleman history of malignant melanoma, A. fib, hypertension, RA presented with cough, dyspnea from skilled nursing facility.  Patient tried on Tylenol and OTC cough medications but with no significant improvement.  Patient recently treated for cellulitis with Zyvox.  CT angiogram chest done with diffuse metastatic lesions noted in the lungs.  Patient noted to have diffuse wheezing.  RSV positive.  COVID-19 PCR was negative, influenza A and B negative.  Patient placed on IV steroids, scheduled nebs, supportive care.   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  Also concern for possible volume overload. -COVID-19 PCR negative.  Influenza A and B negative. -Continue IV Solu-Medrol. -Place on Pulmicort nebs, Claritin, Mucinex. -Continue scheduled duo nebs, Mucinex, PPI. -Lasix 20 mg IV daily. -Follow.  2.  Paroxysmal atrial fibrillation -Resume half home regimen metoprolol for rate control. -Eliquis for anticoagulation.  3.  Hypertension Blood pressure noted to be soft on admission.- -Resume metoprolol at half home dose.  4.  Anxiety -Ativan as needed.  5.  Normocytic anemia -H&H  stable at 8.9. -Transfusion threshold hemoglobin < 7.  6.  Metastatic melanoma -CT angiogram chest concern for metastatic melanoma to the lungs. -Patient seen by oncology and patient at this time felt not a candidate for systemic chemotherapy. -Consulted palliative care. -Oncology following.  7.  Chronic pain -Continue current pain regimen as BP tolerates.  8.  Rheumatoid arthritis -Stable. -Outpatient follow-up.  9.  Pressure injury, POA          DVT prophylaxis: Eliquis Code Status: DNR Family Communication: Updated patient.  No family at bedside. Disposition:   Status is: Inpatient  Remains inpatient appropriate because: Inpatient treatments necessary at this time.  Unsafe disposition.       Consultants:  Oncology: Dr. Marin Olp 02/06/2021 Palliative care pending  Procedures:  CT angiogram chest 01/10/2021 Chest x-ray 01/22/2021   Antimicrobials:  None   Subjective: No significant change with shortness of breath.  No chest pain.  States he was told he is terminal due to metastatic melanoma to his lungs.   Objective: Vitals:   02/06/21 0713 02/06/21 0815 02/06/21 1058 02/06/21 1341  BP: 135/73 137/87 137/75   Pulse: 92 85 92   Resp: 20 (!) 22 18   Temp: 98.1 F (36.7 C)  98.4 F (36.9 C)   TempSrc: Oral  Oral   SpO2: 92%  97% 96%  Weight:      Height:        Intake/Output Summary (Last 24 hours) at 02/06/2021 1657 Last data filed at 02/06/2021 1640 Gross per 24 hour  Intake 410 ml  Output 2625 ml  Net -2215 ml   Filed Weights   01/29/2021 0930  Weight: 61.7  kg    Examination:  General exam: NAD. Respiratory system: Diffuse expiratory wheezing.  No rhonchi.  Fair air movement.  Speaking in full sentences.  Cardiovascular system: S1 & S2 heard, RRR. No JVD, murmurs, rubs, gallops or clicks. No pedal edema. Gastrointestinal system: Abdomen is nondistended, soft and nontender. No organomegaly or masses felt. Normal bowel sounds  heard. Central nervous system: Alert and oriented. No focal neurological deficits. Extremities: Symmetric 5 x 5 power. Skin: No rashes, lesions or ulcers Psychiatry: Judgement and insight appear normal. Mood & affect appropriate.     Data Reviewed: I have personally reviewed following labs and imaging studies  CBC: Recent Labs  Lab 01/07/2021 1029 02/06/21 0438  WBC 4.7 6.0  NEUTROABS 3.4  --   HGB 9.0* 8.9*  HCT 29.7* 29.4*  MCV 97.4 96.1  PLT 138* 147*    Basic Metabolic Panel: Recent Labs  Lab 01/08/2021 1029 02/06/21 0438  NA 133* 135  K 4.0 4.4  CL 95* 97*  CO2 34* 32  GLUCOSE 103* 102*  BUN 18 20  CREATININE 0.89 0.61  CALCIUM 7.9* 8.1*    GFR: Estimated Creatinine Clearance: 71.8 mL/min (by C-G formula based on SCr of 0.61 mg/dL).  Liver Function Tests: Recent Labs  Lab 01/22/2021 1029 02/06/21 0438  AST 24 23  ALT 18 20  ALKPHOS 40 43  BILITOT 0.7 0.5  PROT 5.3* 5.4*  ALBUMIN 2.9* 2.9*    CBG: No results for input(s): GLUCAP in the last 168 hours.   Recent Results (from the past 240 hour(s))  Resp Panel by RT-PCR (Flu A&B, Covid) Nasopharyngeal Swab     Status: None   Collection Time: 01/11/2021 10:29 AM   Specimen: Nasopharyngeal Swab; Nasopharyngeal(NP) swabs in vial transport medium  Result Value Ref Range Status   SARS Coronavirus 2 by RT PCR NEGATIVE NEGATIVE Final    Comment: (NOTE) SARS-CoV-2 target nucleic acids are NOT DETECTED.  The SARS-CoV-2 RNA is generally detectable in upper respiratory specimens during the acute phase of infection. The lowest concentration of SARS-CoV-2 viral copies this assay can detect is 138 copies/mL. A negative result does not preclude SARS-Cov-2 infection and should not be used as the sole basis for treatment or other patient management decisions. A negative result may occur with  improper specimen collection/handling, submission of specimen other than nasopharyngeal swab, presence of viral mutation(s)  within the areas targeted by this assay, and inadequate number of viral copies(<138 copies/mL). A negative result must be combined with clinical observations, patient history, and epidemiological information. The expected result is Negative.  Fact Sheet for Patients:  EntrepreneurPulse.com.au  Fact Sheet for Healthcare Providers:  IncredibleEmployment.be  This test is no t yet approved or cleared by the [place] FDA and  has been authorized for detection and/or diagnosis of SARS-CoV-2 by FDA under an Emergency Use Authorization (EUA). This EUA will remain  in effect (meaning this test can be used) for the duration of the COVID-19 declaration under Section 564(b)(1) of the Act, 21 U.S.C.section 360bbb-3(b)(1), unless the authorization is terminated  or revoked sooner.       Influenza A by PCR NEGATIVE NEGATIVE Final   Influenza B by PCR NEGATIVE NEGATIVE Final    Comment: (NOTE) The Xpert Xpress SARS-CoV-2/FLU/RSV plus assay is intended as an aid in the diagnosis of influenza from Nasopharyngeal swab specimens and should not be used as a sole basis for treatment. Nasal washings and aspirates are unacceptable for Xpert Xpress SARS-CoV-2/FLU/RSV testing.  Fact Sheet  for Patients: EntrepreneurPulse.com.au  Fact Sheet for Healthcare Providers: IncredibleEmployment.be  This test is not yet approved or cleared by the [place] FDA and has been authorized for detection and/or diagnosis of SARS-CoV-2 by FDA under an Emergency Use Authorization (EUA). This EUA will remain in effect (meaning this test can be used) for the duration of the COVID-19 declaration under Section 564(b)(1) of the Act, 21 U.S.C. section 360bbb-3(b)(1), unless the authorization is terminated or revoked.  Performed at [facility], [employer] [address] [place], [place] [address]   Blood Culture (routine x 2)      Status: None (Preliminary result)   Collection Time: 01/25/2021 10:29 AM   Specimen: BLOOD LEFT FOREARM  Result Value Ref Range Status   Specimen Description   Final    BLOOD LEFT FOREARM Performed at [facility] [address] [place], [place] [address]    Special Requests   Final    BOTTLES DRAWN AEROBIC AND ANAEROBIC Blood Culture results may not be optimal due to an inadequate volume of blood received in culture bottles Performed at [facility] [address] [place], [place] [address]    Culture   Final    NO GROWTH < 24 HOURS Performed at [facility] [address] [place], [place] [address]    Report Status PENDING  Incomplete  Blood Culture (routine x 2)     Status: None (Preliminary result)   Collection Time: 01/23/2021 10:29 AM   Specimen: BLOOD  Result Value Ref Range Status   Specimen Description   Final    BLOOD RIGHT ANTECUBITAL Performed at [facility] [address], [place], [place] [address]    Special Requests   Final    BOTTLES DRAWN AEROBIC AND ANAEROBIC Blood Culture results may not be optimal due to an inadequate volume of blood received in culture bottles Performed at [facility] [address], [place], [place] [address]    Culture   Final    NO GROWTH < 24 HOURS Performed at [facility] [address] [place], [place] [address]    Report Status PENDING  Incomplete  Respiratory (~20 pathogens) panel by PCR     Status: Abnormal   Collection Time: 01/13/2021  9:11 PM   Specimen: Nasopharyngeal Swab; Respiratory  Result Value Ref Range Status   Adenovirus NOT DETECTED NOT DETECTED Final   Coronavirus 229E NOT DETECTED NOT DETECTED Final    Comment: (NOTE) The Coronavirus on the Respiratory Panel, DOES NOT test for the novel  Coronavirus (2019 nCoV)    Coronavirus HKU1 NOT DETECTED NOT DETECTED Final   Coronavirus NL63 NOT DETECTED NOT DETECTED Final    Coronavirus OC43 NOT DETECTED NOT DETECTED Final   Metapneumovirus NOT DETECTED NOT DETECTED Final   Rhinovirus / Enterovirus NOT DETECTED NOT DETECTED Final   Influenza A NOT DETECTED NOT DETECTED Final   Influenza B NOT DETECTED NOT DETECTED Final   Parainfluenza Virus 1 NOT DETECTED NOT DETECTED Final   Parainfluenza Virus 2 NOT DETECTED NOT DETECTED Final   Parainfluenza Virus 3 NOT DETECTED NOT DETECTED Final   Parainfluenza Virus 4 NOT DETECTED NOT DETECTED Final   Respiratory Syncytial Virus DETECTED (A) NOT DETECTED Final   Bordetella pertussis NOT DETECTED NOT DETECTED Final   Bordetella Parapertussis NOT DETECTED NOT DETECTED Final   Chlamydophila pneumoniae NOT DETECTED NOT DETECTED Final   Mycoplasma pneumoniae NOT DETECTED NOT DETECTED Final  Comment: Performed at [facility], [place] [address], [place], [place] [address]         Radiology Studies: CT Angio Chest PE W and/or Wo Contrast  Result Date: 01/27/2021 CLINICAL DATA:  Hypoxia.  History of recurrent metastatic melanoma. EXAM: CT ANGIOGRAPHY CHEST WITH CONTRAST TECHNIQUE: Multidetector CT imaging of the chest was performed using the standard protocol during bolus administration of intravenous contrast. Multiplanar CT image reconstructions and MIPs were obtained to evaluate the vascular anatomy. CONTRAST:  52m OMNIPAQUE IOHEXOL 350 MG/ML SOLN COMPARISON:  Prior CTA of the chest on 08/03/2020 FINDINGS: Cardiovascular: The pulmonary arteries are adequately opacified to the subsegmental level. There is no evidence of acute pulmonary embolism. Stable dilated central pulmonary arteries with the main pulmonary artery measuring up to 4.3 cm in diameter. The heart size is stable. No pericardial fluid identified. Stable extensive coronary atherosclerosis with evidence of prior coronary artery stent placement. The thoracic aorta demonstrates atherosclerosis without aneurysmal disease. Mediastinum/Nodes: No enlarged  mediastinal, hilar, or axillary lymph nodes. Thyroid gland, trachea, and esophagus demonstrate no significant findings. Lungs/Pleura: Since the prior CT study, there are innumerable new pulmonary nodules throughout all lobes of both lungs. The most numerous nodules are in the upper lobes bilaterally with chronic volume loss present in the lower lobes bilaterally and increased atelectasis of the right lower lobe. Largest nodule diameter is approximately 7-8 mm in the left upper lobe. Findings are consistent with progressive pulmonary metastatic disease. No associated pleural fluid or pneumothorax. No pulmonary edema. Upper Abdomen: No visible metastatic disease in the visualized upper abdomen. Musculoskeletal: No visualized bony lesions or fractures. Stable diffuse degenerative disc disease of the thoracic spine. Review of the MIP images confirms the above findings. IMPRESSION: 1. No evidence of pulmonary embolism. 2. Innumerable new small pulmonary nodules throughout both lungs which were not present on the April CTA. Findings are consistent with progressive metastatic disease, most likely related to the patient's known history of metastatic/recurrent malignant melanoma. 3. Increased atelectasis of the right lower lobe. 4. Stable dilated central pulmonary arteries consistent with component underlying pulmonary hypertension. Electronically Signed   By: GAletta EdouardM.D.   On: 01/08/2021 13:12   DG Chest Port 1 View  Result Date: 02/04/2021 CLINICAL DATA:  Cough, shortness of breath. EXAM: PORTABLE CHEST 1 VIEW COMPARISON:  January 16, 2021. FINDINGS: Stable cardiomediastinal silhouette. Status post bilateral shoulder arthroplasties. Left subclavian Port-A-Cath is unchanged in position. Stable elevated right hemidiaphragm is noted. Stable bibasilar opacities are noted concerning for edema or infiltrates with possible small pleural effusions. IMPRESSION: Stable bibasilar opacities as described above. Aortic  Atherosclerosis (ICD10-I70.0). Electronically Signed   By: JMarijo ConceptionM.D.   On: 01/08/2021 10:11        Scheduled Meds:  apixaban  5 mg Oral BID   budesonide (PULMICORT) nebulizer solution  0.5 mg Nebulization BID   Chlorhexidine Gluconate Cloth  6 each Topical Daily   cholecalciferol  2,000 Units Oral Daily   collagenase   Topical Daily   fluticasone  2 spray Each Nare Daily   furosemide  20 mg Intravenous Daily   gabapentin  600 mg Oral QHS   guaiFENesin  1,200 mg Oral BID   ipratropium-albuterol  3 mL Nebulization TID   loratadine  10 mg Oral Daily   melatonin  3 mg Oral QHS   methylPREDNISolone (SOLU-MEDROL) injection  60 mg Intravenous Q8H   [START ON 02/07/2021] metoprolol tartrate  12.5 mg Oral BID   pantoprazole  40 mg Oral BID   polyethylene glycol  17 g Oral BID   Ensure Max Protein  237 mL Oral TID BM   tamsulosin  0.4 mg Oral QHS   vitamin B-12  1,000 mcg Oral Daily   Continuous Infusions:   LOS: 1 day    Time spent: 40 minutes    Irine Seal, MD [facility]   To contact the attending provider between 7A-7P or the covering provider during after hours 7P-7A, please log into the web site [email or web address] and access using universal [place] password for that web site. If you do not have the password, please call the hospital operator.  02/06/2021, 4:57 PM

## 2021-02-06 NOTE — Plan of Care (Signed)

## 2021-02-06 NOTE — Consult Note (Signed)
I have placed a request via Secure Chat to Dr.Cuffie  requesting photos of the wound areas of concern to be placed in the EMR.   [employer], [place], [place]
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
## 2021-02-06 NOTE — TOC Initial Note (Signed)
Transition of Care [facility]) - Initial/Assessment Note    Patient Details  Name: [name] MRN: [id number] Date of Birth: [birth year]  Transition of Care [facility]) CM/SW Contact:    Dessa Phi, RN Phone Number: 02/06/2021, 10:11 AM  Clinical Narrative: From [facility]-rep [place] aware-d/c plan to return. DNR. Oncology following. Noted WOC-wounds. Await PT cons for recc.                  Expected Discharge Plan: [place] Barriers to Discharge: Continued Medical Work up   Patient Goals and CMS Choice Patient states their goals for this hospitalization and ongoing recovery are:: Return back to [facility] CMS Medicare.gov Compare Post Acute Care list provided to:: Patient Represenative (must comment) (Debbie-s/o [phone]) Choice offered to / list presented to : Spouse  Expected Discharge Plan and Services Expected Discharge Plan: [place]   Discharge Planning Services: CM Consult     Expected Discharge Date:  (unknown)                                    Prior Living Arrangements/Services   Lives with:: Facility Resident Patient language and need for interpreter reviewed:: Yes        Need for Family Participation in Patient Care: No (Comment) Care giver support system in place?: Yes (comment)   Criminal Activity/Legal Involvement Pertinent to Current Situation/Hospitalization: No - Comment as needed  Activities of Daily Living Home Assistive Devices/Equipment: Eyeglasses, Gilford Rile (specify type) ADL Screening (condition at time of admission) Patient's cognitive ability adequate to safely complete daily activities?: Yes Is the patient deaf or have difficulty hearing?: No Does the patient have difficulty seeing, even when wearing glasses/contacts?: No Does the patient have difficulty concentrating, remembering, or making decisions?: No Patient able to express need for assistance with ADLs?: Yes Does the patient have  difficulty dressing or bathing?: Yes Independently performs ADLs?: No Communication: Independent Dressing (OT): Needs assistance Is this a change from baseline?: Pre-admission baseline Grooming: Needs assistance Is this a change from baseline?: Pre-admission baseline Feeding: Independent Is this a change from baseline?: Change from baseline, expected to last >3 days Bathing: Needs assistance Is this a change from baseline?: Pre-admission baseline Toileting: Needs assistance Is this a change from baseline?: Pre-admission baseline In/Out Bed: Needs assistance Is this a change from baseline?: Pre-admission baseline Walks in Home: Needs assistance Is this a change from baseline?: Pre-admission baseline Does the patient have difficulty walking or climbing stairs?: Yes Weakness of Legs: Both Weakness of Arms/Hands: Both  Permission Sought/Granted Permission sought to share information with : Case Manager Permission granted to share information with : Yes, Verbal Permission Granted        Permission granted to share info w Relationship: Debbie s/o [phone]     Emotional Assessment Appearance:: Appears stated age Attitude/Demeanor/Rapport: Gracious Affect (typically observed): Accepting Orientation: : Oriented to Self, Oriented to Place, Oriented to  Time, Oriented to Situation Alcohol / Substance Use: Alcohol Use Psych Involvement: No (comment)  Admission diagnosis:  Acute respiratory failure (HCC) [J96.00] Acute exacerbation of COPD with asthma ([place]) [J44.1, J45.901] Malignant neoplasm metastatic to both lungs ([place]) [C78.01, C78.02] Patient Active Problem List   Diagnosis Date Noted   Acute respiratory failure ([place]) 01/22/2021   Palliative care by specialist    General weakness    Sepsis ([place]) 01/16/2021   Sepsis due to  cellulitis ([place]) 01/15/2021   Prolonged QT interval 01/15/2021   Pulmonary nodules 01/15/2021   AKI (acute kidney injury) ([place]) 01/15/2021   Open thigh  wound    Citrobacter infection    Pseudomonas infection    Pressure injury of skin 12/21/2020   Fall    Hyperbilirubinemia    Malignant melanoma ([place])    Cellulitis 12/20/2020   Blisters of multiple sites 11/05/2020   Goals of care, counseling/discussion 10/12/2020   Atherosclerosis of aorta ([place]) 07/24/2020   Symptomatic anemia 05/27/2020   Pulmonary embolus ([place]) 05/27/2020   Acute respiratory failure with hypoxia ([place]) 05/26/2020   Callus of foot 04/21/2018   Edema 03/27/2018   Toxic gastroenteritis and colitis 03/25/2018   Other ulcerative 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 ([place]) 59/45/8592   Diarrhea 12/23/2017   Nausea & vomiting 12/23/2017   Herpes zoster 07/07/2017   Constipation 04/21/2017   Genetic testing 03/06/2017   Family history of cancer    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[place]) 02/12/2017   Iron deficiency anemia due to chronic blood loss 12/27/2016   Scalp wound 06/13/2016   Scalp laceration, subsequent encounter 06/04/2016   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   Pain in joint, ankle and foot 09/28/2015   Anemia due to chronic blood loss 09/28/2015   Hyponatremia 09/28/2015   Cramp in limb 09/28/2015   Exertional angina ([place]) 06/17/2014   DOE (dyspnea on exertion) 04/28/2014   Hypokalemia 03/11/2014   Counseling regarding goals of care 12/31/2013   Impaired glucose tolerance 12/31/2013   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[place]) 11/18/2013   Fever 11/18/2013   Leukocytosis 10/30/2013   Fatigue 10/22/2013   Hoarseness of voice 08/10/2013   Sleep disorder 07/05/2013   Tension headache 07/01/2013   Polypharmacy 06/19/2013   Lumbar post-laminectomy syndrome 05/27/2013   Degenerative arthritis of spine 05/26/2013   Hemochromatosis 05/26/2013   Wound infection after surgery 05/26/2013   Normocytic anemia 05/26/2013   Unintentional weight loss 05/26/2013   Protein-calorie malnutrition, severe ([place]) 05/25/2013   Chronic pain syndrome 05/23/2013   COPD  (chronic obstructive pulmonary disease) ([place]) 05/06/2013   Frequent falls 05/06/2013   Encounter for postoperative wound care 05/06/2013   Dehydration 03/17/2013   Atrial fibrillation ([place]) 08/07/2010   Rheumatoid arthritis ([place]) 10/18/2008   Anxiety 11/09/2007   Elevated lipids 03/30/2007   PSEUDOGOUT 03/30/2007   Essential hypertension 03/30/2007   Coronary atherosclerosis 03/30/2007   PCP:  Cassandria Anger, MD Pharmacy:   [facility] [place], [place] [address] [place] [address] Phone: [phone] Fax: [phone]  [facility] [place], [address] [place] [address] Phone: [phone] Fax: [phone]     Social Determinants of Health (SDOH) Interventions    Readmission Risk Interventions Readmission Risk Prevention Plan 01/17/2021  Transportation Screening Complete  HRI or Home Care Consult Complete  SW Recovery Care/Counseling Consult Complete  Palliative Care Screening (No Data)  Some recent data might be hidden

## 2021-02-06 DEATH — deceased

## 2021-02-07 DIAGNOSIS — J9601 Acute respiratory failure with hypoxia: Secondary | ICD-10-CM

## 2021-02-07 DIAGNOSIS — J9600 Acute respiratory failure, unspecified whether with hypoxia or hypercapnia: Secondary | ICD-10-CM | POA: Diagnosis not present

## 2021-02-07 DIAGNOSIS — J441 Chronic obstructive pulmonary disease with (acute) exacerbation: Secondary | ICD-10-CM | POA: Diagnosis not present

## 2021-02-07 DIAGNOSIS — C4372 Malignant melanoma of left lower limb, including hip: Secondary | ICD-10-CM | POA: Diagnosis not present

## 2021-02-07 DIAGNOSIS — I10 Essential (primary) hypertension: Secondary | ICD-10-CM | POA: Diagnosis not present

## 2021-02-07 DIAGNOSIS — Z515 Encounter for palliative care: Secondary | ICD-10-CM

## 2021-02-07 DIAGNOSIS — Z7189 Other specified counseling: Secondary | ICD-10-CM

## 2021-02-07 DIAGNOSIS — J45901 Unspecified asthma with (acute) exacerbation: Secondary | ICD-10-CM | POA: Diagnosis not present

## 2021-02-07 DIAGNOSIS — R0602 Shortness of breath: Secondary | ICD-10-CM | POA: Diagnosis not present

## 2021-02-07 LAB — CBC WITH DIFFERENTIAL/PLATELET
Abs Immature Granulocytes: 0.02 10*3/uL (ref 0.00–0.07)
Basophils Absolute: 0 10*3/uL (ref 0.0–0.1)
Basophils Relative: 0 %
Eosinophils Absolute: 0 10*3/uL (ref 0.0–0.5)
Eosinophils Relative: 0 %
HCT: 28.4 % — ABNORMAL LOW (ref 39.0–52.0)
Hemoglobin: 8.8 g/dL — ABNORMAL LOW (ref 13.0–17.0)
Immature Granulocytes: 1 %
Lymphocytes Relative: 8 %
Lymphs Abs: 0.3 10*3/uL — ABNORMAL LOW (ref 0.7–4.0)
MCH: 29.5 pg (ref 26.0–34.0)
MCHC: 31 g/dL (ref 30.0–36.0)
MCV: 95.3 fL (ref 80.0–100.0)
Monocytes Absolute: 0.1 10*3/uL (ref 0.1–1.0)
Monocytes Relative: 3 %
Neutro Abs: 3 10*3/uL (ref 1.7–7.7)
Neutrophils Relative %: 88 %
Platelets: 137 10*3/uL — ABNORMAL LOW (ref 150–400)
RBC: 2.98 MIL/uL — ABNORMAL LOW (ref 4.22–5.81)
RDW: 15.3 % (ref 11.5–15.5)
WBC: 3.4 10*3/uL — ABNORMAL LOW (ref 4.0–10.5)
nRBC: 0 % (ref 0.0–0.2)

## 2021-02-07 LAB — IRON AND TIBC
Iron: 125 ug/dL (ref 45–182)
Saturation Ratios: 91 % — ABNORMAL HIGH (ref 17.9–39.5)
TIBC: 137 ug/dL — ABNORMAL LOW (ref 250–450)
UIBC: 12 ug/dL

## 2021-02-07 LAB — BASIC METABOLIC PANEL
BUN: 23 mg/dL (ref 8–23)
CO2: 37 mmol/L — ABNORMAL HIGH (ref 22–32)
Calcium: 8.1 mg/dL — ABNORMAL LOW (ref 8.9–10.3)
Chloride: 99 mmol/L (ref 98–111)
Creatinine, Ser: 0.71 mg/dL (ref 0.61–1.24)
GFR, Estimated: >60 mL/min (ref >60)
Glucose, Bld: 159 mg/dL — ABNORMAL HIGH (ref 70–99)
Potassium: 4.2 mmol/L (ref 3.5–5.1)
Sodium: 138 mmol/L (ref 135–145)

## 2021-02-07 LAB — TYPE AND SCREEN
ABO/RH(D): A NEG
Antibody Screen: NEGATIVE

## 2021-02-07 LAB — LACTATE DEHYDROGENASE: LDH: 142 U/L (ref 98–192)

## 2021-02-07 LAB — PREALBUMIN: Prealbumin: 13.9 mg/dL — ABNORMAL LOW (ref 18–38)

## 2021-02-07 LAB — MAGNESIUM: Magnesium: 2.1 mg/dL (ref 1.7–2.4)

## 2021-02-07 MED ORDER — SODIUM CHLORIDE 0.9% FLUSH: 10.0000 mL | INTRAVENOUS | Status: DC | PRN

## 2021-02-07 MED ORDER — ALPRAZOLAM 1 MG PO TABS
1.0000 mg | ORAL_TABLET | Freq: Three times a day (TID) | ORAL | Status: DC | PRN
  Administered 2021-02-07 – 2021-02-08 (×2): 1 mg via ORAL
  Filled 2021-02-07 (×3): qty 1

## 2021-02-07 MED ORDER — HYDROMORPHONE HCL 1 MG/ML IJ SOLN
0.5000 mg | INTRAMUSCULAR | Status: DC | PRN
  Administered 2021-02-07 – 2021-02-08 (×4): 0.5 mg via INTRAVENOUS
  Filled 2021-02-07 (×5): qty 0.5

## 2021-02-07 MED ORDER — CHLORHEXIDINE GLUCONATE CLOTH 2 % EX PADS: 6.0000 | MEDICATED_PAD | Freq: Every day | CUTANEOUS | Status: DC

## 2021-02-07 MED ORDER — PREDNISONE 20 MG PO TABS
60.0000 mg | ORAL_TABLET | Freq: Every day | ORAL | Status: DC
  Administered 2021-02-08: 60 mg via ORAL
  Filled 2021-02-07 (×2): qty 3

## 2021-02-07 NOTE — Progress Notes (Signed)
Manufacturing engineer [facility]) Hospital Liaison: RN note   Notified by Transition of Care Manger of patient/family request for [facility] services at home after discharge. Chart and patient information under review by [facility] physician. Hospice eligibility pending currently.    Writer spoke with [name]  to initiate education related to hospice philosophy, services and team approach to care. Debbie verbalized understanding of information given. Per discussion, plan is for discharge to home by PTAR.   Please send signed and completed DNR form home with patient/family. Patient will need prescriptions for discharge comfort medications.     DME needs have been discussed, patient currently has the following equipment in the home: Lift chair, walker.  Patient/family requests the following DME for delivery to the home:  oxygen. [employer] equipment manager has been notified and will contact DME provider to arrange delivery to the home. Home address has been verified and is correct in the chart. [name] is the family member to contact to arrange time of delivery.     [facility] aware of the above. Please notify [facility] when patient is ready to leave the unit at discharge. (Call [phone] or [phone] after 5pm.) [facility] information and contact numbers given to [facility].      A Please do not hesitate to call with questions.    Thank you,   Farrel Gordon, RN, [place] Hospital Liaison   [phone]

## 2021-02-07 NOTE — Progress Notes (Signed)
Mr. Meulemans is doing better this morning.  His breathing is doing a lot better.  The steroids might be helping him.  I do not hear any wheezing.  We had a long talk about his situation.  He certainly is well aware of the limited timeframe that we are talking about.  He does not want any intervention with respect to immunotherapy or chemotherapy.  We cannot use targeted therapy, which would be incredibly effective because his tumor does not have the BRAF mutation.  He is very comfortable getting Hospice involved.  I think this would be very reasonable.  He lives in [place] so I think hospice can come out to where he lives.  He has a good appetite.  I am sure the steroids are helping.  I may switch him over to oral steroids.  He has had no bleeding.  He has had no fever.  He has had no issues with pain.  Of note, the respiratory viral panel is positive for the RSV.  I spoke given he is not immune to this.  This could certainly be contributing to his respiratory symptoms.  His labs show white cell count of 3.4.  Hemoglobin 8.8.  His platelet count is 280,000.  His BUN is 23 creatinine 0.71.  His prealbumin is 13.9.  Again, I think we are looking at comfort care.  We will see about getting Hospice involved for when he is ready to go home.  He is talking to his family about this.  Again he is very confident and comfortable with his decision.  I know that he is getting very good care by all the staff up on [address].  I appreciate everybody's help.  [name], MD  2 Cor 7:16

## 2021-02-07 NOTE — Progress Notes (Signed)
Palliative care brief progress note  Consult received.  I stopped by to meet with [name] this evening.  He was sleeping and indicated he was tired this evening.  He is open to me stopping by tomorrow.  He has started conversations regarding goals and consideration for hospice with Dr. Marin Olp.  Palliative to follow-up tomorrow to see how we can be of assistance in the care of [name].  Micheline Rough, MD [place] Palliative Medicine Team [phone]  NO CHARGE NOTE

## 2021-02-07 NOTE — Progress Notes (Signed)
PROGRESS NOTE    [name]  YVO:[id number] DOB: 05/27/1945 DOA: 01/10/2021 PCP: Cassandria Anger, MD    Brief Narrative:  75 year old gentleman history of malignant melanoma, A. fib, hypertension, RA presented with cough, dyspnea from skilled nursing facility.  Patient tried on Tylenol and OTC cough medications but with no significant improvement.  Patient recently treated for cellulitis with Zyvox.  CT angiogram chest done with diffuse metastatic lesions noted in the lungs.  Patient noted to have diffuse wheezing.  RSV positive.  COVID-19 PCR was negative, influenza A and B negative.  Patient placed on IV steroids, scheduled nebs, supportive care.   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  Also concern for possible volume overload. -COVID-19 PCR negative.  Influenza A and B negative. -Continue IV Solu-Medrol. -Place on Pulmicort nebs, Claritin, Mucinex. -Continue scheduled duo nebs, Mucinex, PPI. -On Lasix 20 mg IV daily.  2.  Paroxysmal atrial fibrillation -Currently on half home regimen metoprolol for rate control. -Eliquis for anticoagulation.  3.  Hypertension Blood pressure noted to be soft on admission.- -Resume metoprolol at half home dose.  4.  Anxiety -Continue Ativan as needed.  5.  Normocytic anemia -H&H stable at 8.9. -Patient remains hemodynamically stable at this time  6.  Metastatic melanoma -CT angiogram chest concern for metastatic melanoma to the  lungs. -Patient seen by oncology and patient at this time felt not a candidate for systemic chemotherapy. -Appreciate input by oncology.  Very poor prognosis -Appreciate assistance by palliative care.  Patient is now full comfort measures with plan for hospice care at time of discharge  7.  Chronic pain -Continue current pain regimen as BP tolerates.  8.  Rheumatoid arthritis -Stable at this time. -Continue with analgesia as needed  9.  Pressure injury, POA    DVT prophylaxis: Eliquis Code Status: DNR Family Communication: Pt in room, family not at bedside  Status is: Inpatient  Remains inpatient appropriate because: Severity of illness   Consultants:  Palliative Care Oncology  Procedures:    Antimicrobials: Anti-infectives (From admission, onward)    Start     Dose/Rate Route Frequency Ordered Stop   01/27/2021 1430  azithromycin (ZITHROMAX) tablet 500 mg        500 mg Oral  Once 01/28/2021 1418 01/15/2021 1637       Subjective: Eager arrange hospice services. Looking forward to going home soon  Objective: Vitals:   02/06/21 1942 02/06/21 2009 02/07/21 0519 02/07/21 0802  BP:  137/71 130/83   Pulse:  (!) 101 87   Resp:  20 20   Temp:  (!) 97.5 F (36.4 C) 98.2 F (36.8 C)   TempSrc:  Oral Oral   SpO2: 95% 97% 95% 95%  Weight:      Height:        Intake/Output Summary (Last 24 hours) at 02/07/2021 1653 Last data filed at 02/07/2021 0900 Gross per 24 hour  Intake 240 ml  Output 410 ml  Net -170 ml   Filed Weights   01/21/2021 0930  Weight: 61.7 kg    Examination: General exam: Awake, laying in bed, in nad Respiratory system: Normal respiratory effort, no wheezing Cardiovascular system: regular rate, s1, s2 Gastrointestinal system: Soft, nondistended, positive BS Central nervous system: CN2-12 grossly intact, strength intact Extremities: Perfused, no clubbing Skin: Normal skin turgor, no notable skin lesions seen Psychiatry: Mood normal // no visual  hallucinations   Data Reviewed: I have personally reviewed following labs and imaging studies  CBC: Recent Labs  Lab 01/15/2021 1029 02/06/21 0438 02/07/21 0433  WBC 4.7 6.0 3.4*  NEUTROABS 3.4  --  3.0  HGB 9.0* 8.9* 8.8*  HCT 29.7* 29.4* 28.4*  MCV 97.4 96.1 95.3  PLT 138* 147* 545*   Basic Metabolic Panel: Recent Labs  Lab 01/24/2021 1029 02/06/21 0438 02/07/21 0433  NA 133* 135 138  K 4.0 4.4 4.2  CL 95* 97* 99  CO2 34* 32 37*  GLUCOSE 103* 102* 159*  BUN 18 20 23   CREATININE 0.89 0.61 0.71  CALCIUM 7.9* 8.1* 8.1*  MG  --   --  2.1   GFR: Estimated Creatinine Clearance: 71.8 mL/min (by C-G formula based on SCr of 0.71 mg/dL). Liver Function Tests: Recent Labs  Lab 01/28/2021 1029 02/06/21 0438  AST 24 23  ALT 18 20  ALKPHOS 40 43  BILITOT 0.7 0.5  PROT 5.3* 5.4*  ALBUMIN 2.9* 2.9*   No results for input(s): LIPASE, AMYLASE in the last 168 hours. No results for input(s): AMMONIA in the last 168 hours. Coagulation Profile: Recent Labs  Lab 01/07/2021 1029  INR 1.9*   Cardiac Enzymes: No results for input(s): CKTOTAL, CKMB, CKMBINDEX, TROPONINI in the last 168 hours. BNP (last 3 results) No results for input(s): PROBNP in the last 8760 hours. HbA1C: No results for input(s): HGBA1C in the last 72 hours. CBG: No results for input(s): GLUCAP in the last 168 hours. Lipid Profile: No results for input(s): CHOL, HDL, LDLCALC, TRIG, CHOLHDL, LDLDIRECT in the last 72 hours. Thyroid Function Tests: No results for input(s): TSH, T4TOTAL, FREET4, T3FREE, THYROIDAB in the last 72 hours. Anemia Panel: Recent Labs    02/07/21 0433  TIBC 137*  IRON 125   Sepsis Labs: Recent Labs  Lab 01/10/2021 1029  PROCALCITON <0.10  LATICACIDVEN 1.3    Recent Results (from the past 240 hour(s))  Resp Panel by RT-PCR (Flu A&B, Covid) Nasopharyngeal Swab     Status: None   Collection Time: 01/06/2021 10:29 AM   Specimen: Nasopharyngeal Swab; Nasopharyngeal(NP) swabs in vial  transport medium  Result Value Ref Range Status   SARS Coronavirus 2 by RT PCR NEGATIVE NEGATIVE Final    Comment: (NOTE) SARS-CoV-2 target nucleic acids are NOT DETECTED.  The SARS-CoV-2 RNA is generally detectable in upper respiratory specimens during the acute phase of infection. The lowest concentration of SARS-CoV-2 viral copies this assay can detect is 138 copies/mL. A negative result does not preclude SARS-Cov-2 infection and should not be used as the sole basis for treatment or other patient management decisions. A negative result may occur with  improper specimen collection/handling, submission of specimen other than nasopharyngeal swab, presence of viral mutation(s) within the areas targeted by this assay, and inadequate number of viral copies(<138 copies/mL). A negative result must be combined with clinical observations, patient history, and epidemiological information. The expected result is Negative.  Fact Sheet for Patients:  EntrepreneurPulse.com.au  Fact Sheet for Healthcare Providers:  IncredibleEmployment.be  This test is no t yet approved or cleared by the [place]  FDA and  has been authorized for detection and/or diagnosis of SARS-CoV-2 by FDA under an Emergency Use Authorization (EUA). This EUA will remain  in effect (meaning this test can be used) for the duration of the COVID-19 declaration under Section 564(b)(1) of the Act, 21 U.S.C.section 360bbb-3(b)(1), unless the authorization is terminated  or revoked sooner.       Influenza A by PCR NEGATIVE NEGATIVE Final   Influenza B by PCR NEGATIVE NEGATIVE Final    Comment: (NOTE) The Xpert Xpress SARS-CoV-2/FLU/RSV plus assay is intended as an aid in the diagnosis of influenza from Nasopharyngeal swab specimens and should not be used as a sole basis for treatment. Nasal washings and aspirates are unacceptable for Xpert Xpress SARS-CoV-2/FLU/RSV testing.  Fact  Sheet for Patients: EntrepreneurPulse.com.au  Fact Sheet for Healthcare Providers: IncredibleEmployment.be  This test is not yet approved or cleared by the [place] FDA and has been authorized for detection and/or diagnosis of SARS-CoV-2 by FDA under an Emergency Use Authorization (EUA). This EUA will remain in effect (meaning this test can be used) for the duration of the COVID-19 declaration under Section 564(b)(1) of the Act, 21 U.S.C. section 360bbb-3(b)(1), unless the authorization is terminated or revoked.  Performed at [facility], [place] [address] [place], [place] [address]   Blood Culture (routine x 2)     Status: None (Preliminary result)   Collection Time: 01/07/2021 10:29 AM   Specimen: BLOOD LEFT FOREARM  Result Value Ref Range Status   Specimen Description   Final    BLOOD LEFT FOREARM Performed at [place] [address], [place], [place] [address]    Special Requests   Final    BOTTLES DRAWN AEROBIC AND ANAEROBIC Blood Culture results may not be optimal due to an inadequate volume of blood received in culture bottles Performed at [place] [address] [place], [place] [address]    Culture   Final    NO GROWTH 2 DAYS Performed at [place] [address] [place], [place] [address]    Report Status PENDING  Incomplete  Blood Culture (routine x 2)     Status: None (Preliminary result)   Collection Time: 01/24/2021 10:29 AM   Specimen: BLOOD  Result Value Ref Range Status   Specimen Description   Final    BLOOD RIGHT ANTECUBITAL Performed at [place] [address] [place], [place] [address]    Special Requests   Final    BOTTLES DRAWN AEROBIC AND ANAEROBIC Blood Culture results may not be optimal due to an inadequate volume of blood received in culture bottles Performed at [place] [address], [place],  [address]    Culture   Final    NO GROWTH 2 DAYS Performed at [place] [address], [place],  [address]    Report Status PENDING  Incomplete  Respiratory (~20 pathogens) panel by PCR     Status: Abnormal   Collection Time: 01/07/2021  9:11 PM   Specimen: Nasopharyngeal Swab; Respiratory  Result Value Ref Range Status   Adenovirus NOT DETECTED NOT DETECTED Final   Coronavirus 229E NOT DETECTED NOT DETECTED Final    Comment: (NOTE) The Coronavirus on the Respiratory Panel, DOES NOT test for the novel  Coronavirus (2019 nCoV)    Coronavirus HKU1 NOT DETECTED NOT DETECTED Final   Coronavirus NL63 NOT DETECTED NOT DETECTED Final   Coronavirus OC43 NOT  DETECTED NOT DETECTED Final   Metapneumovirus NOT DETECTED NOT DETECTED Final   Rhinovirus / Enterovirus NOT DETECTED NOT DETECTED Final   Influenza A NOT DETECTED NOT DETECTED Final   Influenza B NOT DETECTED NOT DETECTED Final   Parainfluenza Virus 1 NOT DETECTED NOT DETECTED Final   Parainfluenza Virus 2 NOT DETECTED NOT DETECTED Final   Parainfluenza Virus 3 NOT DETECTED NOT DETECTED Final   Parainfluenza Virus 4 NOT DETECTED NOT DETECTED Final   Respiratory Syncytial Virus DETECTED (A) NOT DETECTED Final   Bordetella pertussis NOT DETECTED NOT DETECTED Final   Bordetella Parapertussis NOT DETECTED NOT DETECTED Final   Chlamydophila pneumoniae NOT DETECTED NOT DETECTED Final   Mycoplasma pneumoniae NOT DETECTED NOT DETECTED Final    Comment: Performed at [facility], [place] [address] [place], [place] [address]     Radiology Studies: No results found.  Scheduled Meds:  apixaban  5 mg Oral BID   budesonide (PULMICORT) nebulizer solution  0.5 mg Nebulization BID   cholecalciferol  2,000 Units Oral Daily   collagenase   Topical Daily   fluticasone  2 spray Each Nare Daily   furosemide  20 mg Intravenous Daily   gabapentin  600 mg Oral QHS   guaiFENesin  1,200 mg Oral BID    ipratropium-albuterol  3 mL Nebulization TID   loratadine  10 mg Oral Daily   melatonin  3 mg Oral QHS   metoprolol tartrate  12.5 mg Oral BID   pantoprazole  40 mg Oral BID   polyethylene glycol  17 g Oral BID   [START ON 02/08/2021] predniSONE  60 mg Oral Q breakfast   Ensure Max Protein  237 mL Oral TID BM   tamsulosin  0.4 mg Oral QHS   vitamin B-12  1,000 mcg Oral Daily   Continuous Infusions:   LOS: 2 days   Marylu Lund, MD [facility] Pager On Amion  If 7PM-7AM, please contact night-coverage 02/07/2021, 4:53 PM

## 2021-02-07 NOTE — Progress Notes (Signed)
Chaplain received a page to see [name] after he had heard some difficult news regarding his prognosis. He feels as at peace with it as one might be able to.  His faith is very important to him and he is also tired of the suffering that he has endured.  He feels ready, but is grateful that he has some time in order to say goodbye.  His wife lost her first husband to cancer 22 years ago at a time when her children were still in the home.  She is processing, but is determined to use this time for connection and love.  They have a good support system and live at [facility], so she will not be alone.  As of this visit, they had not yet told his adult children, but planned to tell his daughter and grandson in person over the next few days.  His son just left on an extended trip and [name] is worried about how he will take the news.  They are supporting each other well and loving each other through this.  I provided prayer, at their request and ministry of listening and presence.  [place], [facility] Pager, [phone] 12:09 PM

## 2021-02-07 NOTE — TOC Progression Note (Signed)
Transition of Care [facility]) - Progression Note    Patient Details  Name: CARRELL RAHMANI MRN: [id number] Date of Birth: [birth year]  Transition of Care [facility]) CM/SW Contact  Shamarra Warda, Juliann Pulse, RN Phone Number: 02/07/2021, 3:21 PM  Clinical Narrative: Spoke to patient/spouse about d/c plans-d/c plan Home w/Hospice. Patient will not d/c back to [facility]-patient's spouse lives @ [employer] will now d/c to [place] w/[facility] home hospice services. Already has recliner, rw, currently on 02. PT ordered to assess any additional dme.PTAR @ d/c confirmed address-[facility].     Expected Discharge Plan: Home w Hospice Care Barriers to Discharge: Continued Medical Work up  Expected Discharge Plan and Services Expected Discharge Plan: [place]   Discharge Planning Services: CM Consult Post Acute Care Choice: Hospice Living arrangements for the past 2 months: [place] Expected Discharge Date:  (unknown)                                     Social Determinants of Health (SDOH) Interventions    Readmission Risk Interventions Readmission Risk Prevention Plan 01/17/2021  Transportation Screening Complete  HRI or Home Care Consult Complete  SW Recovery Care/Counseling Consult Complete  Palliative Care Screening (No Data)  Some recent data might be hidden

## 2021-02-08 DIAGNOSIS — C7801 Secondary malignant neoplasm of right lung: Secondary | ICD-10-CM | POA: Diagnosis not present

## 2021-02-08 DIAGNOSIS — I10 Essential (primary) hypertension: Secondary | ICD-10-CM | POA: Diagnosis not present

## 2021-02-08 DIAGNOSIS — J9600 Acute respiratory failure, unspecified whether with hypoxia or hypercapnia: Secondary | ICD-10-CM | POA: Diagnosis not present

## 2021-02-08 DIAGNOSIS — Z7189 Other specified counseling: Secondary | ICD-10-CM | POA: Diagnosis not present

## 2021-02-08 DIAGNOSIS — R0602 Shortness of breath: Secondary | ICD-10-CM | POA: Diagnosis not present

## 2021-02-08 MED ORDER — FENTANYL 25 MCG/HR TD PT72
1.0000 | MEDICATED_PATCH | TRANSDERMAL | Status: DC
  Administered 2021-02-08 – 2021-02-14 (×3): 1 via TRANSDERMAL
  Filled 2021-02-08 (×3): qty 1

## 2021-02-08 MED ORDER — HYDROCODONE BIT-HOMATROP MBR 5-1.5 MG/5ML PO SOLN
5.0000 mL | ORAL | Status: DC | PRN
  Administered 2021-02-08: 5 mL via ORAL
  Filled 2021-02-08: qty 5

## 2021-02-08 MED ORDER — HYDROMORPHONE HCL 1 MG/ML IJ SOLN
0.5000 mg | INTRAMUSCULAR | Status: DC | PRN
Start: 2021-02-08 — End: 2021-02-11
  Administered 2021-02-08: 0.5 mg via INTRAVENOUS
  Administered 2021-02-08: 1 mg via INTRAVENOUS
  Administered 2021-02-09 – 2021-02-10 (×2): 0.5 mg via INTRAVENOUS
  Administered 2021-02-10 – 2021-02-11 (×2): 1 mg via INTRAVENOUS
  Administered 2021-02-11: 0.5 mg via INTRAVENOUS
  Filled 2021-02-08 (×7): qty 1

## 2021-02-08 MED ORDER — GLYCOPYRROLATE 0.2 MG/ML IJ SOLN
0.2000 mg | INTRAMUSCULAR | Status: DC | PRN
  Administered 2021-02-10 – 2021-02-14 (×10): 0.2 mg via INTRAVENOUS
  Filled 2021-02-08 (×13): qty 1

## 2021-02-08 MED ORDER — LORAZEPAM 2 MG/ML IJ SOLN
1.0000 mg | Freq: Three times a day (TID) | INTRAMUSCULAR | Status: DC | PRN
  Administered 2021-02-10: 1 mg via INTRAVENOUS
  Filled 2021-02-08: qty 1

## 2021-02-08 MED ORDER — METHYLPREDNISOLONE SODIUM SUCC 40 MG IJ SOLR
40.0000 mg | INTRAMUSCULAR | Status: DC
  Administered 2021-02-08: 40 mg via INTRAVENOUS
  Filled 2021-02-08: qty 1

## 2021-02-08 MED ORDER — ALPRAZOLAM 1 MG PO TABS
1.0000 mg | ORAL_TABLET | Freq: Three times a day (TID) | ORAL | Status: DC | PRN
  Administered 2021-02-08 – 2021-02-10 (×3): 1 mg via ORAL
  Filled 2021-02-08 (×4): qty 1

## 2021-02-08 NOTE — Plan of Care (Signed)

## 2021-02-08 NOTE — TOC Progression Note (Signed)
Transition of Care [facility]) - Progression Note    Patient Details  Name: [name] MRN: [id number] Date of Birth: 01-04-1946  [facility]) CM/SW Contact  Blessen Kimbrough, Juliann Pulse, RN Phone Number: 02/08/2021, 1:13 PM  Clinical Narrative:  Spoke to patient, & spouse about current d/c plan-[place] living-spouse cannot provide 24hr care,even with hospice services. Spouse explored SNF but wants a new SNF-does not want to return back to [facility] patient states I am dying, can I stay here one more day. Asked patient if wanted to talk to Palliative Care Team again-he said yes. Dr. Domingo Cocking currently in rm with patient-await outcome.[employer] care rep Velta Addison will screen.     Expected Discharge Plan: [place] Barriers to Discharge: Continued Medical Work up  Expected Discharge Plan and Services Expected Discharge Plan: [place]   Discharge Planning Services: CM Consult Post Acute Care Choice: Hospice Living arrangements for the past 2 months: [place] Expected Discharge Date:  (unknown)                                     Social Determinants of Health (SDOH) Interventions    Readmission Risk Interventions Readmission Risk Prevention Plan 01/17/2021  Transportation Screening Complete  HRI or Home Care Consult Complete  SW Recovery Care/Counseling Consult Complete  Palliative Care Screening (No Data)  Some recent data might be hidden

## 2021-02-08 NOTE — Progress Notes (Signed)
Daily Progress Note   Patient Name: [name]       Date: 02/08/2021 DOB: 01-30-1946  Age: 75 y.o. MRN#: [id number] Attending Physician: Donne Hazel, MD Primary Care Physician: Cassandria Anger, MD Admit Date: 01/20/2021  Reason for Consultation/Follow-up: Establishing goals of care  Subjective: Discussed with case management as well as Dr. Wyline Copas.  [name] breathing has worsened this afternoon.  His wife (have been together over a decade though not legally married) reports she cannot meet his care needs at home even with the support of hospice.    I met today with [name] and also discussed with his wife and his daughter in separate conversations on the phone.  [name] feels as though he is dying and states that he does not think he will benefit from rehab and wants to focus on being comfortable.  We discussed that his breathing feels worse to him this afternoon and he is working harder to breathe and is audibly wheezing during my encounter with him.  I also discussed with Dr. Wyline Copas who shares concern that he is acutely decompensating.  We reviewed options for hospice care and [name] believes that at this point he would be best served by transitioning to [facility] for end-of-life care if it is an option.  We discussed plan to continue with current interventions and a focus on comfort throughout the day today.  We will then plan to reassess tomorrow in conjunction with hospice to determine if he would be a candidate for [facility].  I called and discussed with his wife, [name], as well as his daughter, [name] and separate phone calls.  They are both in agreement with focusing on his comfort and understand that he is likely approaching end-of-life  very quickly.  Length of Stay: 3  Current Medications: Scheduled Meds:   apixaban  5 mg Oral BID   budesonide (PULMICORT) nebulizer solution  0.5 mg Nebulization BID   collagenase   Topical Daily   fentaNYL  1 patch Transdermal Q72H   fluticasone  2 spray Each Nare Daily   furosemide  20 mg Intravenous Daily   guaiFENesin  1,200 mg Oral BID   ipratropium-albuterol  3 mL Nebulization TID   loratadine  10 mg Oral Daily   melatonin  3 mg Oral  QHS   methylPREDNISolone (SOLU-MEDROL) injection  40 mg Intravenous Q24H   metoprolol tartrate  12.5 mg Oral BID   pantoprazole  40 mg Oral BID    Continuous Infusions:   PRN Meds: albuterol, ALPRAZolam **OR** LORazepam, glycopyrrolate, HYDROcodone bit-homatropine, HYDROmorphone (DILAUDID) injection, polyvinyl alcohol, sodium chloride flush  Physical Exam         General: Alert, awake, in no acute distress.  Chronically ill-appearing. HEENT: No bruits, no goiter, no JVD Heart: Regular rate and rhythm. No murmur appreciated. Lungs: Fair air movement Abdomen: Soft, nontender, nondistended, positive bowel sounds.   Ext: No significant edema Skin: Warm and dry Neuro: Grossly intact, nonfocal.  Vital Signs: BP 105/71 (BP Location: Right Arm)   Pulse 84   Temp 98.7 F (37.1 C) (Oral)   Resp 19   Ht 5' 6"  (1.676 m)   Wt 61.7 kg   SpO2 95%   BMI 21.95 kg/m  SpO2: SpO2: 95 % O2 Device: O2 Device: Nasal Cannula O2 Flow Rate: O2 Flow Rate (L/min): 4 L/min  Intake/output summary:  Intake/Output Summary (Last 24 hours) at 02/08/2021 1910 Last data filed at 02/08/2021 1630 Gross per 24 hour  Intake 240 ml  Output 2800 ml  Net -2560 ml   LBM: Last BM Date: 02/07/21 Baseline Weight: Weight: 61.7 kg Most recent weight: Weight: 61.7 kg       Palliative Assessment/Data:    Flowsheet Rows    Flowsheet Row Most Recent Value  Intake Tab   Referral Department Hospitalist  Unit at Time of Referral Med/Surg Unit  Palliative Care  Primary Diagnosis Cancer  Date Notified 02/01/2021  Palliative Care Type New Palliative care  Reason for referral Clarify Goals of Care  Date of Admission 02/03/2021  Date first seen by Palliative Care 02/06/21  # of days Palliative referral response time 1 Day(s)  # of days IP prior to Palliative referral 0  Clinical Assessment   Palliative Performance Scale Score 40%  Psychosocial & Spiritual Assessment   Palliative Care Outcomes   Patient/Family meeting held? Yes  Who was at the meeting? 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 ([place]) 02/06/2021   Malignant neoplasm metastatic to both lungs ([facility])    Acute respiratory failure ([place]) 01/10/2021   Palliative care by specialist    General weakness    Sepsis ([place]) 01/16/2021   Sepsis due to cellulitis ([place]) 01/15/2021   Prolonged QT interval 01/15/2021   Pulmonary nodules 01/15/2021   AKI (acute kidney injury) ([place]) 01/15/2021   Open thigh wound    Citrobacter infection    Pseudomonas infection    Pressure injury of skin 12/21/2020   Fall    Hyperbilirubinemia    Malignant melanoma ([place])    Cellulitis 12/20/2020   Blisters of multiple sites 11/05/2020   Goals of care, counseling/discussion 10/12/2020   Atherosclerosis of aorta ([place]) 07/24/2020   Symptomatic anemia 05/27/2020   Pulmonary embolus ([place]) 05/27/2020   Acute respiratory failure with hypoxia ([place]) 05/26/2020   Callus of foot 04/21/2018   Edema 03/27/2018   Toxic gastroenteritis and colitis 03/25/2018   Other ulcerative 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 ([place]) 24/26/8341   Diarrhea 12/23/2017   Nausea & vomiting 12/23/2017   Herpes zoster 07/07/2017   Constipation 04/21/2017   Genetic testing 03/06/2017   Family history of cancer    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[place]) 02/12/2017  Iron deficiency anemia due to chronic blood loss 12/27/2016   Scalp wound  06/13/2016   Scalp laceration, subsequent encounter 06/04/2016   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   Pain in joint, ankle and foot 09/28/2015   Anemia due to chronic blood loss 09/28/2015   Hyponatremia 09/28/2015   Cramp in limb 09/28/2015   Exertional angina ([place]) 06/17/2014   DOE (dyspnea on exertion) 04/28/2014   Hypokalemia 03/11/2014   Counseling regarding goals of care 12/31/2013   Impaired glucose tolerance 12/31/2013   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[place]) 11/18/2013   Fever 11/18/2013   Leukocytosis 10/30/2013   Fatigue 10/22/2013   Hoarseness of voice 08/10/2013   Sleep disorder 07/05/2013   Tension headache 07/01/2013   Polypharmacy 06/19/2013   Lumbar post-laminectomy syndrome 05/27/2013   Degenerative arthritis of spine 05/26/2013   Hemochromatosis 05/26/2013   Wound infection after surgery 05/26/2013   Normocytic anemia 05/26/2013   Unintentional weight loss 05/26/2013   Protein-calorie malnutrition, severe ([place]) 05/25/2013   Chronic pain syndrome 05/23/2013   COPD (chronic obstructive pulmonary disease) ([place]) 05/06/2013   Frequent falls 05/06/2013   Encounter for postoperative wound care 05/06/2013   Dehydration 03/17/2013   Atrial fibrillation ([place]) 08/07/2010   Rheumatoid arthritis ([place]) 10/18/2008   Anxiety 11/09/2007   Elevated lipids 03/30/2007   PSEUDOGOUT 03/30/2007   Essential hypertension 03/30/2007   Coronary atherosclerosis 03/30/2007    Palliative Care Assessment & Plan   Patient Profile: 75 y.o. male  with past medical history of malignant melanoma, A. fib, hypertension, RA admitted on 01/18/2021 with worsening cough and shortness of breath from skilled facility.  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  He is also RSV positive.  Dr. Marin Olp has evaluated patient and recommendation for hospice.  Palliative consulted for goals of care/facilitation of home hospice.   Recommendations/Plan: DNR/DNI Goal moving forward  is for comfort Initial plan was to work to transition home with hospice support.  His wife reports concerns about being able to meet his care needs even with the support of hospice. He does appear to be clinically worse today.  Discussed plan to continue to follow him overnight and reassess his overall clinical situation tomorrow.  If he further decompensates, he and family would like to work to see if he would qualify for [facility] for end-of-life care. Pain/shortness of breath: Agree with addition of fentanyl patch for baseline medication.  Continue Dilaudid as needed for breakthrough symptoms. Anxiety: Continue Xanax (or Ativan if he cannot swallow) as needed  Goals of Care and Additional Recommendations: Limitations on Scope of Treatment: Full Comfort Care  Code Status:    Code Status Orders  (From admission, onward)           Start     Ordered   01/11/2021 1851  Do not attempt resuscitation (DNR)  Continuous       Question Answer Comment  In the event of cardiac or respiratory ARREST Do not call a "code blue"   In the event of cardiac or respiratory ARREST Do not perform Intubation, CPR, defibrillation or ACLS   In the event of cardiac or respiratory ARREST Use medication by any route, position, wound care, and other measures to relive pain and suffering. May use oxygen, suction and manual treatment of airway obstruction as needed for comfort.   Comments DNR confirmed with patient      01/16/2021 1850           Code Status History  Date Active Date Inactive Code Status Order ID Comments User Context   01/15/2021 1556 01/25/2021 0221 DNR [id number]  Reubin Milan, MD ED   12/20/2020 1800 12/27/2020 1732 DNR [id number]  Jonnie Finner, DO Inpatient   05/27/2020 0125 05/29/2020 1847 DNR [id number]  Lenore Cordia, MD Inpatient   02/12/2017 1815 02/13/2017 1604 Full Code [id number]  Stark Klein, MD Inpatient   06/17/2014 0948 06/18/2014 1334 Full Code [id number]  Sherren Mocha, MD Inpatient   10/30/2013 0052 11/01/2013 1341 Full Code [id number]  Shanda Howells, MD ED   06/16/2013 2154 06/19/2013 1600 Full Code [id number]  Merton Border, MD Inpatient   06/08/2013 1534 06/10/2013 1911 Full Code [id number]  Luanne Bras, MD Inpatient   05/27/2013 1642 06/08/2013 1534 Full Code [id number]  Cathlyn Parsons, PA-C Inpatient   05/27/2013 1642 05/27/2013 1642 Full Code [id number]  Cathlyn Parsons, PA-C Inpatient   05/23/2013 2239 05/27/2013 1642 Full Code [id number]  Jonetta Osgood, MD Inpatient   05/06/2013 1552 05/09/2013 1603 Full Code [id number]  Robbie Lis, MD ED   04/13/2013 1713 04/24/2013 2037 Full Code [id number]  Orvan Falconer, MD Inpatient   03/16/2013 1650 04/06/2013 1626 Full Code [id number]  Floyce Stakes, MD Inpatient   03/02/2013 1741 03/10/2013 2021 Full Code [id number]  Floyce Stakes, MD Inpatient      Advance Directive Documentation    Flowsheet Row Most Recent Value  Type of Advance Directive Healthcare Power of Attorney  Pre-existing out of facility DNR order (yellow form or pink MOST form) --  "MOST" Form in Place? --       Prognosis: He looks much worse today.  Concern his prognosis at this point may be < 2 weeks.  Plan to evaluate again tomorrow  Discharge Planning: To Be Determined  Care plan was discussed with patient, wife, daughter  Thank you for allowing the Palliative Medicine Team to assist in the care of this patient.   Time In: 1300 Time Out: 1340 Total Time 40 Prolonged Time Billed No      Greater than 50%  of this time was spent counseling and coordinating care related to the above assessment and plan.  Micheline Rough, MD  Please contact Palliative Medicine Team phone at [phone] for questions and concerns.

## 2021-02-08 NOTE — Progress Notes (Signed)
PT Cancellation Note  Patient Details Name: [name] MRN: [id number] DOB: 1945-09-19   Cancelled Treatment:    Reason Eval/Treat Not Completed: PT screened, no needs identified, will sign off Patient  going home with Hospice/comfort care.  Claretha Cooper 02/08/2021, 10:45 AM  [place] Pager [phone] Office [phone]

## 2021-02-08 NOTE — Consult Note (Signed)
Consultation Note Date: 02/08/2021   Patient Name: [name]  DOB: Jul 14, 1945  MRN: [id number]  Age / Sex: 75 y.o., male  PCP: Plotnikov, Evie Lacks, MD Referring Physician: Donne Hazel, MD  Reason for Consultation: Establishing goals of care  HPI/Patient Profile: 75 y.o. male  with past medical history of malignant melanoma, A. fib, hypertension, RA admitted on 01/19/2021 with worsening cough and shortness of breath from skilled facility.  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  He is also RSV positive.  [name] has evaluated patient and recommendation for hospice.  Palliative consulted for goals of care/facilitation of home hospice.   Clinical Assessment and Goals of Care: Palliative care consult received.  Chart reviewed including personal review of pertinent labs and imaging.  I met today with [name].  I introduced palliative care as specialized medical care for people living with serious illness. It focuses on providing relief from the symptoms and stress of a serious illness. The goal is to improve quality of life for both the patient and the family.  [name] tells me in the things that are most important to him are his family and his faith.  He has children as well as a long-term significant other (they have been together for over a decade and consider themselves to be married but have not had legal ceremony).  His goal is to be out of the hospital and spend as much time with them in a meaningful way as possible.  He tells me very clearly, however, that his main goal is to avoid suffering and he is at peace with God and does not want to prolong his life anymore than its natural course of shutting down.  He has already been in discussion with [name] about hospice services.  We reviewed the difference between a aggressive medical intervention path and a  palliative, comfort focused care path.  We also discussed differences in hospice care including residential hospice, home hospice, and hospice at long-term care facility.  He was appreciative of this information as he reports needing to know what the options are so he can discuss further with his wife.  He feels that the goal is going to be to transition home with hospice support.  They live at [facility] independent living and he is planning to return there.  His wife worked for a period of time with hospice and palliative care of [facility] (who is now Manufacturing engineer) and he thinks that this will be their choice for home hospice services.  He does want to discuss this with her before making final decision.  He is agreeable to case management following up later this afternoon after he has a chance to discuss with her.  We also discussed symptom management he reports continuing to have shortness of breath as well as anxiety.  In the past, he has done very well with low doses of Xanax in combination with opioids to help with this.  He also requested to be able  to have option for IV opioids while he was here in the hospital as they do provide quicker relief.  He fully understands that this will be discontinued and transition back to oral medications at time of discharge.  Questions and concerns addressed.   PMT will continue to support holistically.   SUMMARY OF RECOMMENDATIONS   -DNR/DNI -Goal moving forward is for comfort -Overall plan will be transition home with hospice support.  He will be returning to [facility] and will likely desire to work with Manufacturing engineer.  He is going to discuss this with his wife and transition of care team will follow-up this afternoon to confirm choice. -He would like for [name] to serve as his hospice attending at time of discharge.  I did reach out to [name] who confirmed he would serve as attending. -Pain: Continue Dilaudid.  I did add on  option for IV Dilaudid while he is here in the hospital as he feels it provides quicker relief for him.  He understands that this will need to be discontinued at time of discharge. -Anxiety: Xanax as needed  Code Status/Advance Care Planning: DNR  Additional Recommendations (Limitations, Scope, Preferences): Full Comfort Care  Psycho-social/Spiritual:  Desire for further Chaplaincy support:no Additional Recommendations: Education on Hospice  Prognosis:  Likely weeks.  His prognosis if his disease follows its natural course is certainly less than 6 months and he should qualify for home hospice services.  Discharge Planning: Home with Hospice      Primary Diagnoses: Present on Admission:  Acute respiratory failure (HCC)  Normocytic anemia  Rheumatoid arthritis (HCC)  Symptomatic anemia  Pulmonary nodules  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HCC)  Malignant melanoma ([place])  Essential hypertension   I have reviewed the medical record, interviewed the patient and family, and examined the patient. The following aspects are pertinent.  Past Medical History:  Diagnosis Date   Alcoholism /alcohol abuse    per family   Allergic rhinitis    Allergy    Anxiety    Bacterial infection    CAD (coronary artery disease)    minimal coronary plaque in the LAD and right coronary system. 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 7-06. Last nuclear stress 11-17-06 fixed anterior/ inferior defect, no inducible ischemia, EF 81%   Cataract    CHF (congestive heart failure) (HCC)    Chronic back pain    "all over back"   Chronic neck pain    COPD (chronic obstructive pulmonary disease) ([place])    Depression    Diverticulosis    DVT (deep venous thrombosis) ([place]) 06/2020   Dyspnea    with exertion   Dysrhythmia 01/24/2012   past hx. A.Fib x1 episode-responded to med.   Falls frequently    "since 02/2013" (06/16/2013)   Family  history of cancer    Genetic testing 03/06/2017   Multi-Cancer panel (83 genes) @ [facility] - No pathogenic mutations detected   GERD (gastroesophageal reflux disease)    Goals of care, counseling/discussion 10/12/2020   Hemochromatosis    dx'd 14 yrs ago last ferritin Aug 11, 08 52 (22-322), Fe 136 ("I had 250 phlebotomies for that")   High cholesterol    hx   History of radiation therapy 11/06/2020   left inguinal thigh 10/10/2020-11/06/2020  Dr Gery Pray   Hx of colonic polyps    Hx of colonoscopy    Hypertension    Iron deficiency anemia due to  chronic blood loss 12/27/2016   Malignant melanoma of knee, left (HCC)    Myocardial infarction [facility]) 2006   "related to catheterization"   Narcotic abuse ([place])    per family   Neuropathy    fingers and toes   Osteoarthritis    Peripheral edema    Bilateral ankles   PONV (postoperative nausea and vomiting)    Pulmonary embolism ([place]) 06/2020   RA (rheumatoid arthritis) ([place])    Social History   Socioeconomic History   Marital status: Significant Other    Spouse name: Not on file   Number of children: 2   Years of education: 16   Highest education level: Not on file  Occupational History   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    Comment: self employed  Tobacco Use   Smoking status: Never   Smokeless tobacco: Never  Vaping Use   Vaping Use: Never used  Substance and Sexual Activity   Alcohol use: No    Alcohol/week: 0.0 standard drinks    Comment: "I do not drink anymore"   Drug use: No   Sexual activity: Yes    Partners: Female  Other Topics Concern   Not on file  Social History Narrative   [employer] [place]. Married 1972-39yr seperated/divorced. Engaged April '11. 1 son '74; step-daughter '70. 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 During '11 discovered an [employer]- $500,000+ loss.  Is handling this OK - will keep the business running.   Social Determinants of Health   Financial Resource Strain: Not on file  Food Insecurity: Not on file  Transportation Needs: Not on file  Physical Activity: Not on file  Stress: Not on file  Social Connections: Not on file   Family History  Problem Relation Age of Onset   Uterine cancer Mother        uterine vs. cervix? dx 229s deceased 824  Macular degeneration Mother    Other Mother        ankle edema   Lung cancer Mother    Melanoma Mother        multiple spots on legs   Cancer - Lung Mother        smoker   Coronary artery disease Father    Hypertension Father    Prostate cancer Father    Colon polyps Father    Cancer - Prostate Father 759      deceased 773  Heart attack Brother    Hyperlipidemia Brother    Other Brother        Schizophrenic   Thyroid disease Sister    Hemochromatosis Sister    Rheum arthritis Sister    Coronary artery disease Maternal Aunt    Heart attack Maternal Aunt    Cancer Maternal Grandmother        unk. type; deceased 713s  Diabetes Neg Hx    Colon cancer Neg Hx    Esophageal cancer Neg Hx    Rectal cancer Neg Hx    Stomach cancer Neg Hx    Scheduled Meds:  apixaban  5 mg Oral BID   budesonide (PULMICORT) nebulizer solution  0.5 mg Nebulization BID   cholecalciferol  2,000 Units Oral Daily   collagenase   Topical Daily   fentaNYL  1 patch Transdermal Q72H   fluticasone  2 spray Each Nare Daily  furosemide  20 mg Intravenous Daily   gabapentin  600 mg Oral QHS   guaiFENesin  1,200 mg Oral BID   ipratropium-albuterol  3 mL Nebulization TID   loratadine  10 mg Oral Daily   melatonin  3 mg Oral QHS   metoprolol tartrate  12.5 mg Oral BID   pantoprazole  40 mg Oral BID   polyethylene glycol  17 g Oral BID   predniSONE  60 mg Oral Q breakfast   Ensure Max Protein  237 mL Oral TID BM   tamsulosin  0.4 mg Oral QHS   vitamin B-12  1,000 mcg Oral Daily   Continuous  Infusions: PRN Meds:.albuterol, ALPRAZolam, HYDROcodone bit-homatropine, HYDROmorphone (DILAUDID) injection, HYDROmorphone, polyvinyl alcohol, sodium chloride flush Medications Prior to Admission:  Prior to Admission medications   Medication Sig Start Date End Date Taking? Authorizing Provider  apixaban (ELIQUIS) 5 MG TABS tablet Take 1 tablet (5 mg total) by mouth 2 (two) times daily. 06/06/20 01/16/2021 Yes Plotnikov, Evie Lacks, MD  Cholecalciferol (VITAMIN D) 50 MCG (2000 UT) tablet Take 2,000 Units by mouth daily.   Yes [provider]  Ensure (ENSURE) Take 237 mLs by mouth daily. Chocolate only   Yes [provider]  fluticasone (FLONASE) 50 MCG/ACT nasal spray INSTILL 2 SPRAYS IN EACH NOSTRIL EVERY DAY Patient taking differently: Place 2 sprays into both nostrils daily. 11/19/17  Yes Plotnikov, Evie Lacks, MD  gabapentin (NEURONTIN) 300 MG capsule Take 2 capsules (600 mg total) by mouth at bedtime. 11/02/20  Yes Plotnikov, Evie Lacks, MD  lidocaine-prilocaine (EMLA) cream Apply 1 application topically as needed. Patient taking differently: Apply 1 application topically daily as needed (pain). 05/25/20  Yes Celso Amy, NP  melatonin 3 MG TABS tablet Take 1 tablet (3 mg total) by mouth at bedtime. 01/24/21 02/23/21 Yes John Hazel, MD  metoprolol tartrate (LOPRESSOR) 25 MG tablet TAKE 1 TABLET TWO TIMES DAILY. Patient taking differently: Take 25 mg by mouth 2 (two) times daily. 11/06/20  Yes Plotnikov, Evie Lacks, MD  pantoprazole (PROTONIX) 40 MG tablet Take 1 tablet (40 mg total) by mouth every evening. Patient taking differently: Take 40 mg by mouth 2 (two) times daily. 06/19/20  Yes Plotnikov, Evie Lacks, MD  polyethylene glycol (MIRALAX / GLYCOLAX) 17 g packet Take 17 g by mouth 2 (two) times daily.   Yes [provider]  polyvinyl alcohol (LIQUIFILM TEARS) 1.4 % ophthalmic solution Place 1 drop into both eyes daily as needed for dry eyes.   Yes [provider]  predniSONE (DELTASONE) 10 MG tablet Take 10 mg by mouth daily. 06/19/20  Yes [provider]  tamsulosin (FLOMAX) 0.4 MG CAPS capsule Take 1 capsule (0.4 mg total) by mouth daily. Patient taking differently: Take 0.4 mg by mouth at bedtime. 01/25/21 02/24/21 Yes John Hazel, MD  vitamin B-12 (CYANOCOBALAMIN) 1000 MCG tablet Take 1,000 mcg by mouth daily.    Yes [provider]  albuterol (PROVENTIL HFA;VENTOLIN HFA) 108 (90 Base) MCG/ACT inhaler Inhale 1-2 puffs into the lungs every 4 (four) hours as needed for wheezing or shortness of breath. Patient not taking: Reported on 01/24/2021 09/11/16   Plotnikov, Evie Lacks, MD  HYDROmorphone (DILAUDID) 2 MG tablet Take 1-1.5 tablets (2-3 mg total) by mouth every 8 (eight) hours as needed for severe pain. Prescription needed for transfer to SNF Patient not taking: Reported on 01/11/2021 01/24/21   John Hazel, MD  mupirocin ointment (BACTROBAN) 2 % On leg wound  w/dressing change qd or bid Patient not taking: Reported on 01/14/2021 11/02/20   Plotnikov, Evie Lacks, MD   Allergies  Allergen Reactions   Cefepime Hives and Shortness Of Breath   Cephaeline Hives   Morphine Swelling    A swollen stomach.   Morphine And Related Shortness Of Breath, Nausea And Vomiting, Swelling and Other (See Comments)    Agitation, tolerates dilaudid Other reaction(s): Other (See Comments) Agitation, tolerates dilaudid   Penicillins Hives, Shortness Of Breath and Rash    Has patient had a PCN reaction causing immediate rash, facial/tongue/throat swelling, SOB or lightheadedness with hypotension: Yes Has patient had a PCN reaction causing severe rash involving mucus membranes or skin necrosis: Yes Has patient had a PCN reaction that required hospitalization No Has patient had a PCN reaction occurring within the last 10 years: No If all of the above answers are "NO", then may proceed with Cephalosporin use.    Doxycycline Rash    Oxycodone-Acetaminophen Other (See Comments)    Patient doesn't remember what type of reaction.   Review of Systems  Constitutional:  Positive for activity change and fatigue.  Respiratory:  Positive for cough, shortness of breath and wheezing.   Musculoskeletal:  Positive for back pain.  Neurological:  Positive for weakness.  Psychiatric/Behavioral:  Positive for sleep disturbance.    Physical Exam General: Alert, awake, in no acute distress.  Chronically ill-appearing. HEENT: No bruits, no goiter, no JVD Heart: Regular rate and rhythm. No murmur appreciated. Lungs: Fair air movement Abdomen: Soft, nontender, nondistended, positive bowel sounds.   Ext: No significant edema Skin: Warm and dry Neuro: Grossly intact, nonfocal.   Vital Signs: BP 118/69 (BP Location: Right Arm)   Pulse 99   Temp 98.3 F (36.8 C) (Oral)   Resp 20   Ht 5' 6"  (1.676 m)   Wt 61.7 kg   SpO2 98%   BMI 21.95 kg/m  Pain Scale: 0-10 POSS *See Group Information*: 1-Acceptable,Awake and alert Pain Score: Asleep   SpO2: SpO2: 98 % O2 Device:SpO2: 98 % O2 Flow Rate: .O2 Flow Rate (L/min): 4 L/min  IO: Intake/output summary:  Intake/Output Summary (Last 24 hours) at 02/08/2021 7408 Last data filed at 02/08/2021 1448 Gross per 24 hour  Intake --  Output 2000 ml  Net -2000 ml    LBM: Last BM Date: 02/06/21 Baseline Weight: Weight: 61.7 kg Most recent weight: Weight: 61.7 kg     Palliative Assessment/Data:   Flowsheet Rows    Flowsheet Row Most Recent Value  Intake Tab   Referral Department Hospitalist  Unit at Time of Referral Med/Surg Unit  Palliative Care Primary Diagnosis Cancer  Date Notified 02/06/21  Clinical Assessment   Psychosocial & Spiritual Assessment   Palliative Care Outcomes        Time In: 1856 Time Out: 1120 Time Total: 75 minutes Greater than 50%  of this time was spent counseling and coordinating care related to the above assessment and plan.  Signed by: Micheline Rough, MD   Please contact Palliative Medicine Team phone at [phone] for questions and concerns.  For individual provider: See Shea Evans

## 2021-02-08 NOTE — Progress Notes (Signed)
PROGRESS NOTE    [name]  ZOX:[id number] DOB: 1945/11/07 DOA: 01/07/2021 PCP: Cassandria Anger, MD    Brief Narrative:  75 year old gentleman history of malignant melanoma, A. fib, hypertension, RA presented with cough, dyspnea from skilled nursing facility.  Patient tried on Tylenol and OTC cough medications but with no significant improvement.  Patient recently treated for cellulitis with Zyvox.  CT angiogram chest done with diffuse metastatic lesions noted in the lungs.  Patient noted to have diffuse wheezing.  RSV positive.  COVID-19 PCR was negative, influenza A and B negative.  Patient placed on IV steroids, scheduled nebs, supportive care.   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  Also concern for possible volume overload. -COVID-19 PCR negative.  Influenza A and B negative. -Continue IV Solu-Medrol. -Had been on IV Lasix -Continue bronchodilators, have increased prednisone to IV Solu-Medrol -Appreciate assistance by palliative care, agree with likely transitioning to residential hospice if patient continues to decompensate as he is  2.  Paroxysmal atrial fibrillation -Currently on half home regimen metoprolol for rate control. -Had been on Eliquis  3.  Hypertension Blood pressure noted to be soft on admission.- -Resume metoprolol at half home dose.  4.  Anxiety -Continue  Ativan as needed.  5.  Normocytic anemia -Patient remains hemodynamically stable at this time  6.  Metastatic melanoma -CT angiogram chest concern for metastatic melanoma to the lungs. -Patient seen by oncology and patient at this time felt not a candidate for systemic chemotherapy. -Appreciate input by oncology.  Very poor prognosis with patient appearing worse today -Appreciate assistance by palliative care.  Patient is now full comfort measures plans for possible transition to residential hospice  7.  Chronic pain -Continue current pain regimen as BP tolerates.  8.  Rheumatoid arthritis -Stable at this time. -Continue with analgesia as needed  9.  Pressure injury, POA    DVT prophylaxis: Eliquis Code Status: DNR Family Communication: Pt in room, family not at bedside  Status is: Inpatient  Remains inpatient appropriate because: Severity of illness   Consultants:  Palliative Care Oncology  Procedures:    Antimicrobials: Anti-infectives (From admission, onward)    Start     Dose/Rate Route Frequency Ordered Stop   01/06/2021 1430  azithromycin (ZITHROMAX) tablet 500 mg        500 mg Oral  Once 01/20/2021 1418 01/16/2021 1637       Subjective: Visibly short of breath although patient denies feeling short winded  Objective: Vitals:   02/07/21 2119 02/08/21 0625 02/08/21 0846 02/08/21 1329  BP: 134/70 118/69  105/71  Pulse: 92 99  84  Resp: 18 20  19   Temp: 97.9 F (36.6 C) 98.3 F (36.8 C)  98.7 F (37.1 C)  TempSrc: Oral Oral  Oral  SpO2: 100% 97% 98% 95%  Weight:      Height:        Intake/Output Summary (Last 24 hours) at 02/08/2021 1751 Last  data filed at 02/08/2021 1630 Gross per 24 hour  Intake 240 ml  Output 2800 ml  Net -2560 ml    Filed Weights   01/24/2021 0930  Weight: 61.7 kg    Examination: General exam: Conversant, patient appears to be in mild respiratory distress Respiratory system: Increased respiratory effort, inspiratory and  expiratory wheezing Cardiovascular system: regular rhythm, s1-s2 Gastrointestinal system: Nondistended, nontender, pos BS Central nervous system: No seizures, no tremors Extremities: No cyanosis, no joint deformities Skin: No rashes, no pallor Psychiatry: Affect normal // no auditory hallucinations   Data Reviewed: I have personally reviewed following labs and imaging studies  CBC: Recent Labs  Lab 01/27/2021 1029 02/06/21 0438 02/07/21 0433  WBC 4.7 6.0 3.4*  NEUTROABS 3.4  --  3.0  HGB 9.0* 8.9* 8.8*  HCT 29.7* 29.4* 28.4*  MCV 97.4 96.1 95.3  PLT 138* 147* 137*    Basic Metabolic Panel: Recent Labs  Lab 01/10/2021 1029 02/06/21 0438 02/07/21 0433  NA 133* 135 138  K 4.0 4.4 4.2  CL 95* 97* 99  CO2 34* 32 37*  GLUCOSE 103* 102* 159*  BUN 18 20 23   CREATININE 0.89 0.61 0.71  CALCIUM 7.9* 8.1* 8.1*  MG  --   --  2.1    GFR: Estimated Creatinine Clearance: 71.8 mL/min (by C-G formula based on SCr of 0.71 mg/dL). Liver Function Tests: Recent Labs  Lab 01/24/2021 1029 02/06/21 0438  AST 24 23  ALT 18 20  ALKPHOS 40 43  BILITOT 0.7 0.5  PROT 5.3* 5.4*  ALBUMIN 2.9* 2.9*    No results for input(s): LIPASE, AMYLASE in the last 168 hours. No results for input(s): AMMONIA in the last 168 hours. Coagulation Profile: Recent Labs  Lab 01/16/2021 1029  INR 1.9*    Cardiac Enzymes: No results for input(s): CKTOTAL, CKMB, CKMBINDEX, TROPONINI in the last 168 hours. BNP (last 3 results) No results for input(s): PROBNP in the last 8760 hours. HbA1C: No results for input(s): HGBA1C in the last 72 hours. CBG: No results for input(s): GLUCAP in the last 168 hours. Lipid Profile: No results for input(s): CHOL, HDL, LDLCALC, TRIG, CHOLHDL, LDLDIRECT in the last 72 hours. Thyroid Function Tests: No results for input(s): TSH, T4TOTAL, FREET4, T3FREE, THYROIDAB in the last 72 hours. Anemia Panel: Recent Labs    02/07/21 0433  TIBC 137*  IRON 125    Sepsis  Labs: Recent Labs  Lab 01/08/2021 1029  PROCALCITON <0.10  LATICACIDVEN 1.3     Recent Results (from the past 240 hour(s))  Resp Panel by RT-PCR (Flu A&B, Covid) Nasopharyngeal Swab     Status: None   Collection Time: 01/30/2021 10:29 AM   Specimen: Nasopharyngeal Swab; Nasopharyngeal(NP) swabs in vial transport medium  Result Value Ref Range Status   SARS Coronavirus 2 by RT PCR NEGATIVE NEGATIVE Final    Comment: (NOTE) SARS-CoV-2 target nucleic acids are NOT DETECTED.  The SARS-CoV-2 RNA is generally detectable in upper respiratory specimens during the acute phase of infection. The lowest concentration of SARS-CoV-2 viral copies this assay can detect is 138 copies/mL. A negative result does not preclude SARS-Cov-2 infection and should not be used as the sole basis for treatment or other patient management decisions. A negative result may occur with  improper specimen collection/handling, submission of specimen other than nasopharyngeal swab, presence of viral mutation(s) within the areas targeted by this assay, and inadequate number of viral copies(<138 copies/mL). A negative result must be combined with clinical observations, patient  history, and epidemiological information. The expected result is Negative.  Fact Sheet for Patients:  EntrepreneurPulse.com.au  Fact Sheet for Healthcare Providers:  IncredibleEmployment.be  This test is no t yet approved or cleared by the [place] FDA and  has been authorized for detection and/or diagnosis of SARS-CoV-2 by FDA under an Emergency Use Authorization (EUA). This EUA will remain  in effect (meaning this test can be used) for the duration of the COVID-19 declaration under Section 564(b)(1) of the Act, 21 U.S.C.section 360bbb-3(b)(1), unless the authorization is terminated  or revoked sooner.       Influenza A by PCR NEGATIVE NEGATIVE Final   Influenza B by PCR NEGATIVE NEGATIVE Final     Comment: (NOTE) The Xpert Xpress SARS-CoV-2/FLU/RSV plus assay is intended as an aid in the diagnosis of influenza from Nasopharyngeal swab specimens and should not be used as a sole basis for treatment. Nasal washings and aspirates are unacceptable for Xpert Xpress SARS-CoV-2/FLU/RSV testing.  Fact Sheet for Patients: EntrepreneurPulse.com.au  Fact Sheet for Healthcare Providers: IncredibleEmployment.be  This test is not yet approved or cleared by the [place] FDA and has been authorized for detection and/or diagnosis of SARS-CoV-2 by FDA under an Emergency Use Authorization (EUA). This EUA will remain in effect (meaning this test can be used) for the duration of the COVID-19 declaration under Section 564(b)(1) of the Act, 21 U.S.C. section 360bbb-3(b)(1), unless the authorization is terminated or revoked.  Performed at [facility], [place] [address] [place], [place] [address]   Blood Culture (routine x 2)     Status: None (Preliminary result)   Collection Time: 01/28/2021 10:29 AM   Specimen: BLOOD LEFT FOREARM  Result Value Ref Range Status   Specimen Description   Final    BLOOD LEFT FOREARM Performed at [facility] [address], [place], [place] [address]    Special Requests   Final    BOTTLES DRAWN AEROBIC AND ANAEROBIC Blood Culture results may not be optimal due to an inadequate volume of blood received in culture bottles Performed at [facility] [address] [place], [place] [address]    Culture   Final    NO GROWTH 3 DAYS Performed at [facility], [place] [address], [place], [place] [address]    Report Status PENDING  Incomplete  Blood Culture (routine x 2)     Status: None (Preliminary result)   Collection Time: 01/21/2021 10:29 AM   Specimen: BLOOD  Result Value Ref Range Status   Specimen Description   Final    BLOOD RIGHT ANTECUBITAL Performed at [facility] [address] [place], [place] [address]    Special Requests   Final    BOTTLES DRAWN AEROBIC AND ANAEROBIC Blood Culture results may not be optimal due to an inadequate volume of blood received in culture bottles Performed at [facility] [address], [place], [place] [address]    Culture   Final    NO GROWTH 3 DAYS Performed at [facility], [place] [address], [place], [place] [address]    Report Status PENDING  Incomplete  Respiratory (~20 pathogens) panel by PCR     Status: Abnormal   Collection Time: 01/14/2021  9:11 PM   Specimen: Nasopharyngeal Swab; Respiratory  Result Value Ref Range Status   Adenovirus NOT DETECTED NOT DETECTED Final   Coronavirus 229E NOT DETECTED NOT DETECTED Final    Comment: (NOTE) The Coronavirus  on the Respiratory Panel, DOES NOT test for the novel  Coronavirus (2019 nCoV)    Coronavirus HKU1 NOT DETECTED NOT DETECTED Final   Coronavirus NL63 NOT DETECTED NOT DETECTED Final   Coronavirus OC43 NOT DETECTED NOT DETECTED Final   Metapneumovirus NOT DETECTED NOT DETECTED Final   Rhinovirus / Enterovirus NOT DETECTED NOT DETECTED Final   Influenza A NOT DETECTED NOT DETECTED Final   Influenza B NOT DETECTED NOT DETECTED Final   Parainfluenza Virus 1 NOT DETECTED NOT DETECTED Final   Parainfluenza Virus 2 NOT DETECTED NOT DETECTED Final   Parainfluenza Virus 3 NOT DETECTED NOT DETECTED Final   Parainfluenza Virus 4 NOT DETECTED NOT DETECTED Final   Respiratory Syncytial Virus DETECTED (A) NOT DETECTED Final   Bordetella pertussis NOT DETECTED NOT DETECTED Final   Bordetella Parapertussis NOT DETECTED NOT DETECTED Final   Chlamydophila pneumoniae NOT DETECTED NOT DETECTED Final   Mycoplasma pneumoniae NOT DETECTED NOT DETECTED Final    Comment: Performed at [facility], [place] [address], [place], [place] [address]      Radiology Studies: No results found.  Scheduled Meds:  apixaban  5 mg  Oral BID   budesonide (PULMICORT) nebulizer solution  0.5 mg Nebulization BID   collagenase   Topical Daily   fentaNYL  1 patch Transdermal Q72H   fluticasone  2 spray Each Nare Daily   furosemide  20 mg Intravenous Daily   guaiFENesin  1,200 mg Oral BID   ipratropium-albuterol  3 mL Nebulization TID   loratadine  10 mg Oral Daily   melatonin  3 mg Oral QHS   methylPREDNISolone (SOLU-MEDROL) injection  40 mg Intravenous Q24H   metoprolol tartrate  12.5 mg Oral BID   pantoprazole  40 mg Oral BID   Continuous Infusions:   LOS: 3 days   Marylu Lund, MD [facility] Pager On Amion  If 7PM-7AM, please contact night-coverage 02/08/2021, 5:51 PM

## 2021-02-08 NOTE — Progress Notes (Signed)
Mr. Mayeda clearly is more congested today.  I doubt this is from the melanoma or this is from the RSV.  He is on some steroids.  He is getting some nebulizers.  I think a Duragesic patch would not be a bad idea for him.  This may help with the breathing.  I talked him about this.  He is okay with doing this.  His appetite is good.  I am sure this is from the prednisone that he is taking.  He is coughing up clear mucus.  There is no problems with hemoptysis.  He has had no problems with pain.  There is a little bit of chest pain from the metastatic disease/RSV.  His vital signs show temperature of 98.3.  Pulse 99.  Blood pressure 118/69.  His lungs do sound congested.  He has wheezing.  He has rhonchi.  He does have decent air movement bilaterally.  Cardiac exam is slightly tachycardic but regular.  Abdomen is soft.  We are getting Hospice for him.  I think this would be a very good idea.  Hopefully, there will be any type of issues with his family.  Apparently, his "wife" is not actually his wife.  They have been together for 13 years but not married.  He has a daughter who wants to try to help out.  I talked him about all of this.  I would had to believe that legally, his "wife" still has the final say in what is going on.  I think he probably needs to give her a formal Power of Healthcare authorization.  I know that he is getting wonderful care by all the staff up on the floor.  He really needs to have some cough medicine.  To see what is ordered.  [name], MD  2 Chronicles 20:12

## 2021-02-09 DIAGNOSIS — J441 Chronic obstructive pulmonary disease with (acute) exacerbation: Secondary | ICD-10-CM | POA: Diagnosis not present

## 2021-02-09 DIAGNOSIS — Z7189 Other specified counseling: Secondary | ICD-10-CM | POA: Diagnosis not present

## 2021-02-09 DIAGNOSIS — J45901 Unspecified asthma with (acute) exacerbation: Secondary | ICD-10-CM | POA: Diagnosis not present

## 2021-02-09 DIAGNOSIS — I10 Essential (primary) hypertension: Secondary | ICD-10-CM | POA: Diagnosis not present

## 2021-02-09 DIAGNOSIS — J9601 Acute respiratory failure with hypoxia: Secondary | ICD-10-CM | POA: Diagnosis not present

## 2021-02-09 DIAGNOSIS — G893 Neoplasm related pain (acute) (chronic): Secondary | ICD-10-CM | POA: Diagnosis not present

## 2021-02-09 DIAGNOSIS — C7801 Secondary malignant neoplasm of right lung: Secondary | ICD-10-CM | POA: Diagnosis not present

## 2021-02-09 MED ORDER — LIP MEDEX EX OINT
TOPICAL_OINTMENT | CUTANEOUS | Status: DC | PRN
  Administered 2021-02-09: 1 via TOPICAL
  Filled 2021-02-09: qty 7

## 2021-02-09 MED ORDER — IPRATROPIUM-ALBUTEROL 0.5-2.5 (3) MG/3ML IN SOLN
3.0000 mL | RESPIRATORY_TRACT | Status: DC | PRN
  Administered 2021-02-10: 3 mL via RESPIRATORY_TRACT
  Filled 2021-02-09: qty 3

## 2021-02-09 NOTE — TOC Progression Note (Addendum)
Transition of Care [facility]) - Progression Note    Patient Details  Name: ANICETO KYSER MRN: [id number] Date of Birth: October 07, 1945  [facility] CM/SW Contact  Anntonette Madewell, Juliann Pulse, RN Phone Number: 02/09/2021, 10:06 AM  Clinical Narrative:  -Please indicate if referral for residential hospice needed since [facility] only screened & they cannot document per a screening. Patient was from [facility]-but currently too declined to tolerate therapy @ SNF, [facility]-spouse cannot provide 24hr care. [facility]ice eligible for @ [facility].  11a-currently hospital death to be evaluated daily. 12p- Just spoke to son Steven-listed as contact person-he says his sister Lenna Sciara  & himself has HCPOA, [name] is the girlfriend. I asked if he has a copy of HCPOA he says its in the house somewhere. MD updated.  Expected Discharge Plan: [place] Barriers to Discharge: Continued Medical Work up  Expected Discharge Plan and Services Expected Discharge Plan: [place]   Discharge Planning Services: CM Consult Post Acute Care Choice: Hospice Living arrangements for the past 2 months: [place] Expected Discharge Date:  (unknown)                                     Social Determinants of Health (SDOH) Interventions    Readmission Risk Interventions Readmission Risk Prevention Plan 01/17/2021  Transportation Screening Complete  HRI or Home Care Consult Complete  SW Recovery Care/Counseling Consult Complete  Palliative Care Screening (No Data)  Some recent data might be hidden

## 2021-02-09 NOTE — Plan of Care (Signed)
  Problem: Education: Goal: Knowledge of General Education information will improve Description: Including pain rating scale, medication(s)/side effects and non-pharmacologic comfort measures Outcome: Completed/Met   Problem: Health Behavior/Discharge Planning: Goal: Ability to manage health-related needs will improve Outcome: Progressing   Problem: Clinical Measurements: Goal: Ability to maintain clinical measurements within normal limits will improve Outcome: Adequate for Discharge Goal: Will remain free from infection Outcome: Adequate for Discharge Goal: Diagnostic test results will improve Outcome: Adequate for Discharge

## 2021-02-09 NOTE — Progress Notes (Signed)
Daily Progress Note   Patient Name: [name]       Date: 02/09/2021 DOB: [birth year]  Age: 75 y.o. MRN#: [id number] Attending Physician: Donne Hazel, MD Primary Care Physician: Cassandria Anger, MD Admit Date: 01/11/2021  Reason for Consultation/Follow-up: Establishing goals of care  Subjective: I saw and examined [name] today.  He continues to decline.  He was awake and able to discuss some with me, however, he we does appear to be more confused today than he was yesterday.  He also appears much weaker and is working harder to manage secretions.  His breathing continues to worsen.  I discussed today with patient's daughter and long-term significant other at the bedside today.  I also discussed with his son via phone.  Overall, [name] is transitioning to actively dying.  We have discussed plan for full comfort per his prior stated desire to focus only on things to support his comfort.  Concerned about stability for him to be transported to [facility] and we discussed plan to continue to see how he does today and reassess tomorrow.  They understand there is a high likelihood this will be a hospital death.  Length of Stay: 4  Current Medications: Scheduled Meds:   collagenase   Topical Daily   fentaNYL  1 patch Transdermal Q72H    Continuous Infusions:   PRN Meds: ALPRAZolam **OR** LORazepam, glycopyrrolate, HYDROcodone bit-homatropine, HYDROmorphone (DILAUDID) injection, ipratropium-albuterol, lip balm, sodium chloride flush  Physical Exam         General: Alert, awake, some excess secretions.  Chronically ill-appearing. HEENT: No bruits, no goiter, no JVD Heart: Regular rate and rhythm. No murmur appreciated. Lungs: Fair air movement Abdomen:  Soft, nontender, nondistended, positive bowel sounds.   Ext: No significant edema Skin: Warm and dry Neuro: Grossly intact, nonfocal.  Vital Signs: BP 108/68 (BP Location: Right Arm)   Pulse 74   Temp 97.7 F (36.5 C) (Oral)   Resp 19   Ht 5' 6"  (1.676 m)   Wt 61.7 kg   SpO2 98%   BMI 21.95 kg/m  SpO2: SpO2: 98 % O2 Device: O2 Device: Nasal Cannula O2 Flow Rate: O2 Flow Rate (L/min): 4 L/min  Intake/output summary:  Intake/Output Summary (Last 24 hours) at 02/09/2021 1920 Last data filed at 02/09/2021 0400 Gross  per 24 hour  Intake --  Output 350 ml  Net -350 ml    LBM: Last BM Date: 02/07/21 Baseline Weight: Weight: 61.7 kg Most recent weight: Weight: 61.7 kg       Palliative Assessment/Data:    Flowsheet Rows    Flowsheet Row Most Recent Value  Intake Tab   Referral Department Hospitalist  Unit at Time of Referral Med/Surg Unit  Palliative Care Primary Diagnosis Cancer  Date Notified 02/01/2021  Palliative Care Type New Palliative care  Reason for referral Clarify Goals of Care  Date of Admission 02/03/2021  Date first seen by Palliative Care 02/06/21  # of days Palliative referral response time 1 Day(s)  # of days IP prior to Palliative referral 0  Clinical Assessment   Palliative Performance Scale Score 40%  Psychosocial & Spiritual Assessment   Palliative Care Outcomes   Patient/Family meeting held? Yes  Who was at the meeting? 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 ([place]) 02/06/2021   Malignant neoplasm metastatic to both lungs (HCC)    Acute respiratory failure ([place]) 02/04/2021   Palliative care by specialist    General weakness    Sepsis ([place]) 01/16/2021   Sepsis due to cellulitis ([place]) 01/15/2021   Prolonged QT interval 01/15/2021   Pulmonary nodules 01/15/2021   AKI (acute kidney injury) ([place]) 01/15/2021   Open thigh wound     Citrobacter infection    Pseudomonas infection    Pressure injury of skin 12/21/2020   Fall    Hyperbilirubinemia    Malignant melanoma ([place])    Cellulitis 12/20/2020   Blisters of multiple sites 11/05/2020   Goals of care, counseling/discussion 10/12/2020   Atherosclerosis of aorta ([place]) 07/24/2020   Symptomatic anemia 05/27/2020   Pulmonary embolus ([place]) 05/27/2020   Acute respiratory failure with hypoxia ([place]) 05/26/2020   Callus of foot 04/21/2018   Edema 03/27/2018   Toxic gastroenteritis and colitis 03/25/2018   Other ulcerative 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 ([place]) 16/01/9603   Diarrhea 12/23/2017   Nausea & vomiting 12/23/2017   Herpes zoster 07/07/2017   Constipation 04/21/2017   Genetic testing 03/06/2017   Family history of cancer    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[place]) 02/12/2017   Iron deficiency anemia due to chronic blood loss 12/27/2016   Scalp wound 06/13/2016   Scalp laceration, subsequent encounter 06/04/2016   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   Pain in joint, ankle and foot 09/28/2015   Anemia due to chronic blood loss 09/28/2015   Hyponatremia 09/28/2015   Cramp in limb 09/28/2015   Exertional angina ([place]) 06/17/2014   DOE (dyspnea on exertion) 04/28/2014   Hypokalemia 03/11/2014   Counseling regarding goals of care 12/31/2013   Impaired glucose tolerance 12/31/2013   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[place]) 11/18/2013   Fever 11/18/2013   Leukocytosis 10/30/2013   Fatigue 10/22/2013   Hoarseness of voice 08/10/2013   Sleep disorder 07/05/2013   Tension headache 07/01/2013   Polypharmacy 06/19/2013   Lumbar post-laminectomy syndrome 05/27/2013   Degenerative arthritis of spine 05/26/2013   Hemochromatosis 05/26/2013   Wound infection after surgery 05/26/2013   Normocytic anemia 05/26/2013   Unintentional weight loss 05/26/2013   Protein-calorie malnutrition, severe ([place]) 05/25/2013   Chronic pain syndrome 05/23/2013   COPD (chronic  obstructive pulmonary disease) ([place]) 05/06/2013   Frequent falls 05/06/2013   Encounter for postoperative wound care  05/06/2013   Dehydration 03/17/2013   Atrial fibrillation ([place]) 08/07/2010   Rheumatoid arthritis ([place]) 10/18/2008   Anxiety 11/09/2007   Elevated lipids 03/30/2007   PSEUDOGOUT 03/30/2007   Essential hypertension 03/30/2007   Coronary atherosclerosis 03/30/2007    Palliative Care Assessment & Plan   Patient Profile: 75 y.o. male  with past medical history of malignant melanoma, A. fib, hypertension, RA admitted on 01/14/2021 with worsening cough and shortness of breath from skilled facility.  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  He is also RSV positive.  Dr. [place] Olp has evaluated patient and recommendation for hospice.  Palliative consulted for goals of care/facilitation of home hospice.   Recommendations/Plan: DNR/DNI Goal moving forward is for comfort We discussed transition to [facility] for end-of-life care, however, concern is if he is stable enough to consider transfer.  Discussed that there is high likelihood he will have a hospital death, however, we will continue to assess daily to determine if he may be stable enough to consider transition to residential hospice. Pain/shortness of breath: Agree with addition of fentanyl patch for baseline medication.  Continue Dilaudid as needed for breakthrough symptoms. Anxiety: Continue Xanax (or Ativan if he cannot swallow) as needed  Goals of Care and Additional Recommendations: Limitations on Scope of Treatment: Full Comfort Care  Code Status:    Code Status Orders  (From admission, onward)           Start     Ordered   01/12/2021 1851  Do not attempt resuscitation (DNR)  Continuous       Question Answer Comment  In the event of cardiac or respiratory ARREST Do not call a "code blue"   In the event of cardiac or respiratory ARREST Do not perform Intubation, CPR, defibrillation or ACLS    In the event of cardiac or respiratory ARREST Use medication by any route, position, wound care, and other measures to relive pain and suffering. May use oxygen, suction and manual treatment of airway obstruction as needed for comfort.   Comments DNR confirmed with patient      01/09/2021 1850           Code Status History     Date Active Date Inactive Code Status Order ID Comments User Context   01/15/2021 1556 01/25/2021 0221 DNR [id number]  Reubin Milan, MD ED   12/20/2020 1800 12/27/2020 1732 DNR [id number]  Jonnie Finner, DO Inpatient   05/27/2020 0125 05/29/2020 1847 DNR [id number]  Lenore Cordia, MD Inpatient   02/12/2017 1815 02/13/2017 1604 Full Code [id number]  Stark Klein, MD Inpatient   06/17/2014 0948 06/18/2014 1334 Full Code [id number]  Sherren Mocha, MD Inpatient   10/30/2013 0052 11/01/2013 1341 Full Code [id number]  Shanda Howells, MD ED   06/16/2013 2154 06/19/2013 1600 Full Code [id number]  Merton Border, MD Inpatient   06/08/2013 1534 06/10/2013 1911 Full Code [id number]  Luanne Bras, MD Inpatient   05/27/2013 1642 06/08/2013 1534 Full Code [id number]  Cathlyn Parsons, PA-C Inpatient   05/27/2013 1642 05/27/2013 1642 Full Code [id number]  Cathlyn Parsons, PA-C Inpatient   05/23/2013 2239 05/27/2013 1642 Full Code [id number]  Jonetta Osgood, MD Inpatient   05/06/2013 1552 05/09/2013 1603 Full Code [id number]  Robbie Lis, MD ED   04/13/2013 1713 04/24/2013 2037 Full Code [id number]  Orvan Falconer, MD Inpatient   03/16/2013 1650 04/06/2013 1626 Full Code [id number]  Floyce Stakes, MD Inpatient   03/02/2013 1741 03/10/2013 2021 Full  Code [id number]  Floyce Stakes, MD Inpatient      Advance Directive Documentation    Flowsheet Row Most Recent Value  Type of Advance Directive Healthcare Power of Attorney  Pre-existing out of facility DNR order (yellow form or pink MOST form) --  "MOST" Form in Place? --       Prognosis: Likely hours to days  Discharge  Planning: Anticipated Hospital Death  Care plan was discussed with patient, wife, daughter, son via phone  Thank you for allowing the Palliative Medicine Team to assist in the care of this patient.   Time In: 1020 Time Out: 1050 Total Time 30 Prolonged Time Billed No   Greater than 50%  of this time was spent counseling and coordinating care related to the above assessment and plan.   Micheline Rough, MD  Please contact Palliative Medicine Team phone at [phone] for questions and concerns.

## 2021-02-09 NOTE — Progress Notes (Signed)
Unfortunately, Mr. Pargas clearly has taken a turn.  He is not responding to me this morning.  He has periods of apnea.  He does appear to be comfortable.  He does appear to be "transitioning."  I am little bit surprised this is so quick.  However, he is ready.  He and I have had long talks about his wishes.  He does not want to exist.  He is certainly okay with passing on quickly.  I am not sure if he will make it through the weekend.  He appears comfortable.  I know that he has had a tough past couple months.  I really do not think that anything else needs to be done.  We will start to decrease medications that he is on.  There are quite a few that he really does not need to be on any longer.  I will try to call his wife and let her know what is going on.  Again, given the significant change from yesterday, I would be surprised if he made it through the weekend.  [name], MD  2 Timothy 4:16-18

## 2021-02-09 NOTE — Progress Notes (Signed)
Manufacturing engineer [facility]) Hospital Liaison Note  Update on this referral:  Per Epic MD notes/TOC, Patient is experiencing further decline and as of this morning is not stable to discharge. [facility] will put a hold on this hospice referral and continue to follow until decisions are made regarding patient's EOL care. Spoke with family at bedside to support.   Please reach out to [facility] for hospice related concerns or questions, we would love to support this patient and family as appropriate.   Thank you,  Gar Ponto, RN [facility] HLT [phone]

## 2021-02-09 NOTE — Care Management Important Message (Signed)
Important Message  Patient Details IM Letter given to the Patient. Name: [name] MRN: [id number] Date of Birth: 05-19-1945   Medicare Important Message Given:  Yes     Kerin [place] 02/09/2021, 2:10 PM

## 2021-02-09 NOTE — Progress Notes (Signed)
Provided support to [name] and his wife, [name].  They were spending time together and preferred some privacy before the rest of the family arrived.  [name] was somewhat confused and [name] was beside him keeping him calm and comfortable.  [place], [place] Pager, [phone] 11:24 PM

## 2021-02-09 NOTE — Progress Notes (Signed)
PROGRESS NOTE    [name]  BXI:[id number] DOB: May 16, 1945 DOA: 01/29/2021 PCP: Cassandria Anger, MD    Brief Narrative:  75 year old gentleman history of malignant melanoma, A. fib, hypertension, RA presented with cough, dyspnea from skilled nursing facility.  Patient tried on Tylenol and OTC cough medications but with no significant improvement.  Patient recently treated for cellulitis with Zyvox.  CT angiogram chest done with diffuse metastatic lesions noted in the lungs.  Patient noted to have diffuse wheezing.  RSV positive.  COVID-19 PCR was negative, influenza A and B negative.  Patient placed on IV steroids, scheduled nebs, supportive care.   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  Also concern for possible volume overload. -COVID-19 PCR negative.  Influenza A and B negative. Continue bronchodilators as needed and O2 for comfort -Appreciate assistance by palliative care, focus now on full comfort status  2.  Paroxysmal atrial fibrillation -Currently on half home regimen metoprolol for rate control. -Had been on Eliquis, now stopped secondary to comfort status  3.  Hypertension Blood pressure noted to be soft on admission.- -Blood pressure medications now held secondary to [place] status  4.  Anxiety -Continue Ativan as needed.  5.  Normocytic anemia -Patient remains  hemodynamically stable at this time  6.  Metastatic melanoma -CT angiogram chest concern for metastatic melanoma to the lungs. -Patient seen by oncology and patient at this time felt not a candidate for systemic chemotherapy. -Appreciate input by oncology.  Very poor prognosis with patient appearing worse today -Appreciate assistance by palliative care.  Focus is now full comfort  7.  Chronic pain -Continue current pain regimen as BP tolerates.  8.  Rheumatoid arthritis -Stable at this time. -Continue with analgesia as needed  9.  Pressure injury, POA    DVT prophylaxis: Eliquis Code Status: DNR Family Communication: Pt in room, family not at bedside  Status is: Inpatient  Remains inpatient appropriate because: Severity of illness   Consultants:  Palliative Care Oncology  Procedures:    Antimicrobials: Anti-infectives (From admission, onward)    Start     Dose/Rate Route Frequency Ordered Stop   01/30/2021 1430  azithromycin (ZITHROMAX) tablet 500 mg        500 mg Oral  Once 01/19/2021 1418 01/11/2021 1637       Subjective: Unable to assess, patient is confused this morning  Objective: Vitals:   02/08/21 1329 02/08/21 1918 02/08/21 2026 02/09/21 0310  BP: 105/71  111/63 108/68  Pulse: 84  67 74  Resp: 19     Temp: 98.7 F (37.1 C)  98.1 F (36.7 C) 97.7 F (36.5 C)  TempSrc: Oral  Oral Oral  SpO2: 95% 97% 96% 98%  Weight:      Height:        Intake/Output Summary (Last 24 hours) at 02/09/2021 1706 Last data filed at 02/09/2021 0400 Gross per 24 hour  Intake --  Output 350 ml  Net -350 ml    Filed Weights   02/02/2021 0930  Weight: 61.7 kg    Examination: General exam: Awake, laying in bed, in nad Respiratory system: Normal respiratory effort, no wheezing Cardiovascular system: regular rate, s1, s2 Gastrointestinal system: Soft, nondistended, positive BS Central nervous system: CN2-12 grossly intact, strength intact Extremities: Perfused, no  clubbing Skin: Normal skin turgor, no notable skin lesions seen Psychiatry: Appears to be confused, difficult to assess  Data Reviewed: I have personally reviewed following labs and imaging studies  CBC: Recent Labs  Lab 01/19/2021 1029 02/06/21 0438 02/07/21 0433  WBC 4.7 6.0 3.4*  NEUTROABS 3.4  --  3.0  HGB 9.0* 8.9* 8.8*  HCT 29.7* 29.4* 28.4*  MCV 97.4 96.1 95.3  PLT 138* 147* 137*    Basic Metabolic Panel: Recent Labs  Lab 01/27/2021 1029 02/06/21 0438 02/07/21 0433  NA 133* 135 138  K 4.0 4.4 4.2  CL 95* 97* 99  CO2 34* 32 37*  GLUCOSE 103* 102* 159*  BUN 18 20 23   CREATININE 0.89 0.61 0.71  CALCIUM 7.9* 8.1* 8.1*  MG  --   --  2.1    GFR: Estimated Creatinine Clearance: 71.8 mL/min (by C-G formula based on SCr of 0.71 mg/dL). Liver Function Tests: Recent Labs  Lab 01/12/2021 1029 02/06/21 0438  AST 24 23  ALT 18 20  ALKPHOS 40 43  BILITOT 0.7 0.5  PROT 5.3* 5.4*  ALBUMIN 2.9* 2.9*    No results for input(s): LIPASE, AMYLASE in the last 168 hours. No results for input(s): AMMONIA in the last 168 hours. Coagulation Profile: Recent Labs  Lab 01/23/2021 1029  INR 1.9*    Cardiac Enzymes: No results for input(s): CKTOTAL, CKMB, CKMBINDEX, TROPONINI in the last 168 hours. BNP (last 3 results) No results for input(s): PROBNP in the last 8760 hours. HbA1C: No results for input(s): HGBA1C in the last 72 hours. CBG: No results for input(s): GLUCAP in the last 168 hours. Lipid Profile: No results for input(s): CHOL, HDL, LDLCALC, TRIG, CHOLHDL, LDLDIRECT in the last 72 hours. Thyroid Function Tests: No results for input(s): TSH, T4TOTAL, FREET4, T3FREE, THYROIDAB in the last 72 hours. Anemia Panel: Recent Labs    02/07/21 0433  TIBC 137*  IRON 125    Sepsis Labs: Recent Labs  Lab 01/29/2021 1029  PROCALCITON <0.10  LATICACIDVEN 1.3     Recent Results (from the past 240 hour(s))  Resp Panel by RT-PCR (Flu A&B, Covid) Nasopharyngeal Swab      Status: None   Collection Time: 01/10/2021 10:29 AM   Specimen: Nasopharyngeal Swab; Nasopharyngeal(NP) swabs in vial transport medium  Result Value Ref Range Status   SARS Coronavirus 2 by RT PCR NEGATIVE NEGATIVE Final    Comment: (NOTE) SARS-CoV-2 target nucleic acids are NOT DETECTED.  The SARS-CoV-2 RNA is generally detectable in upper respiratory specimens during the acute phase of infection. The lowest concentration of SARS-CoV-2 viral copies this assay can detect is 138 copies/mL. A negative result does not preclude SARS-Cov-2 infection and should not be used as the sole basis for treatment or other patient management decisions. A negative result may occur with  improper specimen collection/handling, submission of specimen other than nasopharyngeal swab, presence of viral mutation(s) within the areas targeted by this assay, and inadequate number of viral copies(<138 copies/mL). A negative result must be combined with clinical observations, patient history, and epidemiological information. The expected result is Negative.  Fact Sheet for Patients:  EntrepreneurPulse.com.au  Fact Sheet  for Healthcare Providers:  IncredibleEmployment.be  This test is no t yet approved or cleared by the [place] and  has been authorized for detection and/or diagnosis of SARS-CoV-2 by FDA under an Emergency Use Authorization (EUA). This EUA will remain  in effect (meaning this test can be used) for the duration of the COVID-19 declaration under Section 564(b)(1) of the Act, 21 U.S.C.section 360bbb-3(b)(1), unless the authorization is terminated  or revoked sooner.       Influenza A by PCR NEGATIVE NEGATIVE Final   Influenza B by PCR NEGATIVE NEGATIVE Final    Comment: (NOTE) The Xpert Xpress SARS-CoV-2/FLU/RSV plus assay is intended as an aid in the diagnosis of influenza from Nasopharyngeal swab specimens and should not be used as a sole basis  for treatment. Nasal washings and aspirates are unacceptable for Xpert Xpress SARS-CoV-2/FLU/RSV testing.  Fact Sheet for Patients: EntrepreneurPulse.com.au  Fact Sheet for Healthcare Providers: IncredibleEmployment.be  This test is not yet approved or cleared by the [place] FDA and has been authorized for detection and/or diagnosis of SARS-CoV-2 by FDA under an Emergency Use Authorization (EUA). This EUA will remain in effect (meaning this test can be used) for the duration of the COVID-19 declaration under Section 564(b)(1) of the Act, 21 U.S.C. section 360bbb-3(b)(1), unless the authorization is terminated or revoked.  Performed at [facility], [place] [address] [place], [place] [address]   Blood Culture (routine x 2)     Status: None (Preliminary result)   Collection Time: 01/26/2021 10:29 AM   Specimen: BLOOD LEFT FOREARM  Result Value Ref Range Status   Specimen Description   Final    BLOOD LEFT FOREARM Performed at [place] [address] [place], [place] [address]    Special Requests   Final    BOTTLES DRAWN AEROBIC AND ANAEROBIC Blood Culture results may not be optimal due to an inadequate volume of blood received in culture bottles Performed at [place] [address], [place], [place] [address]    Culture   Final    NO GROWTH 4 DAYS Performed at [facility], [place] [address], [place], [place] [address]    Report Status PENDING  Incomplete  Blood Culture (routine x 2)     Status: None (Preliminary result)   Collection Time: 01/19/2021 10:29 AM   Specimen: BLOOD  Result Value Ref Range Status   Specimen Description   Final    BLOOD RIGHT ANTECUBITAL Performed at [place] [address] [place], [place] [address]    Special Requests   Final    BOTTLES DRAWN AEROBIC AND ANAEROBIC Blood Culture results may not be optimal due to an  inadequate volume of blood received in culture bottles Performed at [place] [address], [place], [place] [address]    Culture   Final    NO GROWTH 4 DAYS Performed at [facility], [place] [address] [place],  [address]    Report Status PENDING  Incomplete  Respiratory (~20 pathogens) panel by PCR     Status: Abnormal   Collection Time: 01/23/2021  9:11 PM   Specimen: Nasopharyngeal Swab; Respiratory  Result Value Ref Range Status   Adenovirus NOT DETECTED NOT DETECTED Final   Coronavirus 229E NOT DETECTED NOT DETECTED Final    Comment: (NOTE) The Coronavirus on the Respiratory Panel, DOES NOT test for the novel  Coronavirus (2019 nCoV)    Coronavirus HKU1  NOT DETECTED NOT DETECTED Final   Coronavirus NL63 NOT DETECTED NOT DETECTED Final   Coronavirus OC43 NOT DETECTED NOT DETECTED Final   Metapneumovirus NOT DETECTED NOT DETECTED Final   Rhinovirus / Enterovirus NOT DETECTED NOT DETECTED Final   Influenza A NOT DETECTED NOT DETECTED Final   Influenza B NOT DETECTED NOT DETECTED Final   Parainfluenza Virus 1 NOT DETECTED NOT DETECTED Final   Parainfluenza Virus 2 NOT DETECTED NOT DETECTED Final   Parainfluenza Virus 3 NOT DETECTED NOT DETECTED Final   Parainfluenza Virus 4 NOT DETECTED NOT DETECTED Final   Respiratory Syncytial Virus DETECTED (A) NOT DETECTED Final   Bordetella pertussis NOT DETECTED NOT DETECTED Final   Bordetella Parapertussis NOT DETECTED NOT DETECTED Final   Chlamydophila pneumoniae NOT DETECTED NOT DETECTED Final   Mycoplasma pneumoniae NOT DETECTED NOT DETECTED Final    Comment: Performed at [facility], [place] [address], [place], [place] [address]      Radiology Studies: No results found.  Scheduled Meds:  collagenase   Topical Daily   fentaNYL  1 patch Transdermal Q72H   Continuous Infusions:   LOS: 4 days   Marylu Lund, MD [facility] Pager On Amion  If 7PM-7AM, please contact  night-coverage 02/09/2021, 5:06 PM

## 2021-02-10 DIAGNOSIS — C7801 Secondary malignant neoplasm of right lung: Secondary | ICD-10-CM | POA: Diagnosis not present

## 2021-02-10 DIAGNOSIS — I10 Essential (primary) hypertension: Secondary | ICD-10-CM | POA: Diagnosis not present

## 2021-02-10 DIAGNOSIS — G893 Neoplasm related pain (acute) (chronic): Secondary | ICD-10-CM | POA: Diagnosis not present

## 2021-02-10 DIAGNOSIS — J9601 Acute respiratory failure with hypoxia: Secondary | ICD-10-CM | POA: Diagnosis not present

## 2021-02-10 DIAGNOSIS — Z7189 Other specified counseling: Secondary | ICD-10-CM | POA: Diagnosis not present

## 2021-02-10 LAB — CULTURE, BLOOD (ROUTINE X 2)
Culture: NO GROWTH
Culture: NO GROWTH

## 2021-02-10 NOTE — Progress Notes (Signed)
Daily Progress Note   Patient Name: [name]       Date: 02/10/2021 DOB: 1946-02-08  Age: 75 y.o. MRN#: [id number] Attending Physician: Donne Hazel, MD Primary Care Physician: Cassandria Anger, MD Admit Date: 01/30/2021  Reason for Consultation/Follow-up: Establishing goals of care  Subjective: I saw and examined [name] today.  He appears weaker today but is still awake and  interactive.  Discussed with his wife via phone.  Still concern regarding if he is stable enough to consider transport to residential hospice.  Length of Stay: 5  Current Medications: Scheduled Meds:   collagenase   Topical Daily   fentaNYL  1 patch Transdermal Q72H    Continuous Infusions:   PRN Meds: ALPRAZolam **OR** LORazepam, glycopyrrolate, HYDROcodone bit-homatropine, HYDROmorphone (DILAUDID) injection, ipratropium-albuterol, lip balm, sodium chloride flush  Physical Exam         General: Alert, awake, some excess secretions.  Chronically ill-appearing. HEENT: No bruits, no goiter, no JVD Heart: Regular rate and rhythm. No murmur appreciated. Lungs: Fair air movement Abdomen: Soft, nontender, nondistended, positive bowel sounds.   Ext: No significant edema Skin: Warm and dry Neuro: Grossly intact, nonfocal.  Vital Signs: BP (!) 152/79 (BP Location: Right Arm)   Pulse (!) 108   Temp 98.6 F (37 C) (Oral)   Resp 18   Ht 5' 6"  (1.676 m)   Wt 61.7 kg   SpO2 93%   BMI 21.95 kg/m  SpO2: SpO2: 93 % O2 Device: O2 Device: Nasal Cannula O2 Flow Rate: O2 Flow Rate (L/min): 3 L/min  Intake/output summary:  Intake/Output Summary (Last 24 hours) at 02/10/2021 2240 Last data filed at 02/10/2021 1300 Gross per 24 hour  Intake 0 ml  Output 150 ml  Net -150 ml    LBM: Last  BM Date: 02/07/21 Baseline Weight: Weight: 61.7 kg Most recent weight: Weight: 61.7 kg       Palliative Assessment/Data:    Flowsheet Rows    Flowsheet Row Most Recent Value  Intake Tab   Referral Department Hospitalist  Unit at Time of Referral Med/Surg Unit  Palliative Care Primary Diagnosis Cancer  Date Notified 01/21/2021  Palliative Care Type New Palliative care  Reason for referral Clarify Goals of Care  Date of Admission 02/04/2021  Date first seen by Palliative Care 02/06/21  #  of days Palliative referral response time 1 Day(s)  # of days IP prior to Palliative referral 0  Clinical Assessment   Palliative Performance Scale Score 40%  Psychosocial & Spiritual Assessment   Palliative Care Outcomes   Patient/Family meeting held? Yes  Who was at the meeting? 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 ([name]) 02/06/2021   Malignant neoplasm metastatic to both lungs ([name])    Acute respiratory failure ([place]) 01/27/2021   Palliative care by specialist    General weakness    Sepsis ([name]) 01/16/2021   Sepsis due to cellulitis ([place]) 01/15/2021   Prolonged QT interval 01/15/2021   Pulmonary nodules 01/15/2021   AKI (acute kidney injury) ([name]) 01/15/2021   Open thigh wound    Citrobacter infection    Pseudomonas infection    Pressure injury of skin 12/21/2020   Fall    Hyperbilirubinemia    Malignant melanoma ([name])    Cellulitis 12/20/2020   Blisters of multiple sites 11/05/2020   Goals of care, counseling/discussion 10/12/2020   Atherosclerosis of aorta ([name]) 07/24/2020   Symptomatic anemia 05/27/2020   Pulmonary embolus ([name]) 05/27/2020   Acute respiratory failure with hypoxia ([name]) 05/26/2020   Callus of foot 04/21/2018   Edema 03/27/2018   Toxic gastroenteritis and colitis 03/25/2018   Other ulcerative 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 ([name]) 53/66/4403   Diarrhea  12/23/2017   Nausea & vomiting 12/23/2017   Herpes zoster 07/07/2017   Constipation 04/21/2017   Genetic testing 03/06/2017   Family history of cancer    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) 02/12/2017   Iron deficiency anemia due to chronic blood loss 12/27/2016   Scalp wound 06/13/2016   Scalp laceration, subsequent encounter 06/04/2016   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   Pain in joint, ankle and foot 09/28/2015   Anemia due to chronic blood loss 09/28/2015   Hyponatremia 09/28/2015   Cramp in limb 09/28/2015   Exertional angina ([name]) 06/17/2014   DOE (dyspnea on exertion) 04/28/2014   Hypokalemia 03/11/2014   Counseling regarding goals of care 12/31/2013   Impaired glucose tolerance 12/31/2013   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[name]) 11/18/2013   Fever 11/18/2013   Leukocytosis 10/30/2013   Fatigue 10/22/2013   Hoarseness of voice 08/10/2013   Sleep disorder 07/05/2013   Tension headache 07/01/2013   Polypharmacy 06/19/2013   Lumbar post-laminectomy syndrome 05/27/2013   Degenerative arthritis of spine 05/26/2013   Hemochromatosis 05/26/2013   Wound infection after surgery 05/26/2013   Normocytic anemia 05/26/2013   Unintentional weight loss 05/26/2013   Protein-calorie malnutrition, severe ([name]) 05/25/2013   Chronic pain syndrome 05/23/2013   COPD (chronic obstructive pulmonary disease) ([place]) 05/06/2013   Frequent [name] 05/06/2013   Encounter for postoperative wound care 05/06/2013   Dehydration 03/17/2013   Atrial fibrillation ([name]) 08/07/2010   Rheumatoid arthritis ([name]) 10/18/2008   Anxiety 11/09/2007   Elevated lipids 03/30/2007   PSEUDOGOUT 03/30/2007   Essential hypertension 03/30/2007   Coronary atherosclerosis 03/30/2007    Palliative Care Assessment & Plan   Patient Profile: 75 y.o. male  with past medical history of malignant melanoma, A. fib, hypertension, RA admitted on 01/08/2021 with worsening cough and shortness of breath from skilled  facility.  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  He is also RSV positive.  Dr. Marin Olp has evaluated patient and recommendation for hospice.  Palliative consulted for  goals of care/facilitation of home hospice.   Recommendations/Plan: DNR/DNI Goal moving forward is for comfort We discussed transition to [facility] for end-of-life care, however, concern is if he is stable enough to consider transfer.  Plan to reassess tomorrow for stability to transition to residential hospice.  If not, this will be a hospital death.  Discussed with [name] and also left VM for daughter. Pain/shortness of breath: Agree with addition of fentanyl patch for baseline medication.  Continue Dilaudid as needed for breakthrough symptoms. Anxiety: Continue Xanax (or Ativan if he cannot swallow) as needed  Goals of Care and Additional Recommendations: Limitations on Scope of Treatment: Full Comfort Care  Code Status:    Code Status Orders  (From admission, onward)           Start     Ordered   01/08/2021 1851  Do not attempt resuscitation (DNR)  Continuous       Question Answer Comment  In the event of cardiac or respiratory ARREST Do not call a "code blue"   In the event of cardiac or respiratory ARREST Do not perform Intubation, CPR, defibrillation or ACLS   In the event of cardiac or respiratory ARREST Use medication by any route, position, wound care, and other measures to relive pain and suffering. May use oxygen, suction and manual treatment of airway obstruction as needed for comfort.   Comments DNR confirmed with patient      01/31/2021 1850           Code Status History     Date Active Date Inactive Code Status Order ID Comments User Context   01/15/2021 1556 01/25/2021 0221 DNR [id number]  Reubin Milan, MD ED   12/20/2020 1800 12/27/2020 1732 DNR [id number]  Jonnie Finner, DO Inpatient   05/27/2020 0125 05/29/2020 1847 DNR [id number]  Lenore Cordia, MD Inpatient    02/12/2017 1815 02/13/2017 1604 Full Code [id number]  Stark Klein, MD Inpatient   06/17/2014 0948 06/18/2014 1334 Full Code [id number]  Sherren Mocha, MD Inpatient   10/30/2013 0052 11/01/2013 1341 Full Code [id number]  Shanda Howells, MD ED   06/16/2013 2154 06/19/2013 1600 Full Code [id number]  Merton Border, MD Inpatient   06/08/2013 1534 06/10/2013 1911 Full Code [id number]  Luanne Bras, MD Inpatient   05/27/2013 1642 06/08/2013 1534 Full Code [id number]  Cathlyn Parsons, PA-C Inpatient   05/27/2013 1642 05/27/2013 1642 Full Code [id number]  Cathlyn Parsons, PA-C Inpatient   05/23/2013 2239 05/27/2013 1642 Full Code [id number]  Jonetta Osgood, MD Inpatient   05/06/2013 1552 05/09/2013 1603 Full Code [id number]  Robbie Lis, MD ED   04/13/2013 1713 04/24/2013 2037 Full Code [id number]  Orvan Falconer, MD Inpatient   03/16/2013 1650 04/06/2013 1626 Full Code [id number]  Floyce Stakes, MD Inpatient   03/02/2013 1741 03/10/2013 2021 Full Code [id number]  Floyce Stakes, MD Inpatient      Advance Directive Documentation    Flowsheet Row Most Recent Value  Type of Advance Directive Healthcare Power of Attorney  Pre-existing out of facility DNR order (yellow form or pink MOST form) --  "MOST" Form in Place? --       Prognosis: Likely hours to days  Discharge Planning: Anticipated Hospital Death  Care plan was discussed with patient, wife, daughter, son via phone  Thank you for allowing the Palliative Medicine Team to assist in the care of this patient.       Total Time 25 Prolonged Time Billed No  Greater than 50%  of this time was spent counseling and coordinating care related to the above assessment and plan.  Micheline Rough, MD  Please contact Palliative Medicine Team phone at [phone] for questions and concerns.

## 2021-02-11 DIAGNOSIS — I10 Essential (primary) hypertension: Secondary | ICD-10-CM | POA: Diagnosis not present

## 2021-02-11 DIAGNOSIS — C7801 Secondary malignant neoplasm of right lung: Secondary | ICD-10-CM | POA: Diagnosis not present

## 2021-02-11 DIAGNOSIS — C4372 Malignant melanoma of left lower limb, including hip: Secondary | ICD-10-CM

## 2021-02-11 DIAGNOSIS — J45901 Unspecified asthma with (acute) exacerbation: Secondary | ICD-10-CM

## 2021-02-11 DIAGNOSIS — G893 Neoplasm related pain (acute) (chronic): Secondary | ICD-10-CM | POA: Diagnosis not present

## 2021-02-11 DIAGNOSIS — J441 Chronic obstructive pulmonary disease with (acute) exacerbation: Secondary | ICD-10-CM | POA: Diagnosis not present

## 2021-02-11 DIAGNOSIS — C7800 Secondary malignant neoplasm of unspecified lung: Secondary | ICD-10-CM | POA: Diagnosis not present

## 2021-02-11 DIAGNOSIS — J9600 Acute respiratory failure, unspecified whether with hypoxia or hypercapnia: Secondary | ICD-10-CM | POA: Diagnosis not present

## 2021-02-11 MED ORDER — HYDROMORPHONE BOLUS VIA INFUSION
1.0000 mg | INTRAVENOUS | Status: DC | PRN
  Administered 2021-02-11 – 2021-02-14 (×2): 1 mg via INTRAVENOUS
  Filled 2021-02-11: qty 1

## 2021-02-11 MED ORDER — HYDROMORPHONE HCL 1 MG/ML IJ SOLN
1.0000 mg | INTRAMUSCULAR | Status: AC | PRN
  Administered 2021-02-11 (×3): 2 mg via INTRAVENOUS
  Filled 2021-02-11 (×4): qty 2

## 2021-02-11 MED ORDER — HYDROMORPHONE HCL 1 MG/ML IJ SOLN
0.5000 mg | Freq: Once | INTRAMUSCULAR | Status: AC
  Administered 2021-02-11: 0.5 mg via INTRAVENOUS
  Filled 2021-02-11: qty 0.5

## 2021-02-11 MED ORDER — SODIUM CHLORIDE 0.9 % IV SOLN
0.5000 mg/h | INTRAVENOUS | Status: DC
  Administered 2021-02-11: 0.5 mg/h via INTRAVENOUS
  Filled 2021-02-11 (×2): qty 5

## 2021-02-11 NOTE — Progress Notes (Signed)
Daily Progress Note   Patient Name: [name]       Date: 02/11/2021 DOB: 07/24/1945  Age: 75 y.o. MRN#: [id number] Attending Physician: Donne Hazel, MD Primary Care Physician: Cassandria Anger, MD Admit Date: 01/30/2021  Reason for Consultation/Follow-up: Establishing goals of care  Subjective: I saw and examined [name] today.  He was lying in bed in no significant distress at the time of my encounter, however, he was noted to have increased pain earlier today and required an additional breakthrough dose of Dilaudid after receiving currently ordered medication.  I discussed with him and he reports his pain is currently doing okay but he was having significant increase in pain earlier today.  We reviewed plan for increasing availability of as needed medication but also discussed that if he continues to have increasing needs and is not comfortable, I would have a low threshold to start continuous infusion if it was needed to ensure his comfort.  Length of Stay: 6  Current Medications: Scheduled Meds:   collagenase   Topical Daily   fentaNYL  1 patch Transdermal Q72H    Continuous Infusions:   PRN Meds: ALPRAZolam **OR** LORazepam, glycopyrrolate, HYDROcodone bit-homatropine, HYDROmorphone (DILAUDID) injection, ipratropium-albuterol, lip balm, sodium chloride flush  Physical Exam         General: Alert, awake, some excess secretions.  Chronically ill-appearing.  Weak and frail HEENT: No bruits, no goiter, no JVD Heart: Regular rate and rhythm. No murmur appreciated. Lungs: Fair air movement Abdomen: Soft, nontender, nondistended, positive bowel sounds.   Ext: No significant edema Skin: Warm and dry Neuro: Grossly intact, nonfocal.  Vital Signs: BP (!) 152/79  (BP Location: Right Arm)   Pulse (!) 108   Temp 98.6 F (37 C) (Oral)   Resp 18   Ht 5' 6"  (1.676 m)   Wt 61.7 kg   SpO2 93%   BMI 21.95 kg/m  SpO2: SpO2: 93 % O2 Device: O2 Device: Nasal Cannula O2 Flow Rate: O2 Flow Rate (L/min): 3 L/min  Intake/output summary:  Intake/Output Summary (Last 24 hours) at 02/11/2021 1311 Last data filed at 02/11/2021 1100 Gross per 24 hour  Intake --  Output 1 ml  Net -1 ml    LBM: Last BM Date: 02/07/21 Baseline Weight: Weight: 61.7 kg Most recent weight: Weight: 61.7  kg       Palliative Assessment/Data:    Flowsheet Rows    Flowsheet Row Most Recent Value  Intake Tab   Referral Department Hospitalist  Unit at Time of Referral Med/Surg Unit  Palliative Care Primary Diagnosis Cancer  Date Notified 01/27/2021  Palliative Care Type New Palliative care  Reason for referral Clarify Goals of Care  Date of Admission 01/09/2021  Date first seen by Palliative Care 02/06/21  # of days Palliative referral response time 1 Day(s)  # of days IP prior to Palliative referral 0  Clinical Assessment   Palliative Performance Scale Score 40%  Psychosocial & Spiritual Assessment   Palliative Care Outcomes   Patient/Family meeting held? Yes  Who was at the meeting? 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 ([place]) 02/06/2021   Malignant neoplasm metastatic to both lungs ([facility])    Acute respiratory failure ([place]) 01/14/2021   Palliative care by specialist    General weakness    Sepsis ([place]) 01/16/2021   Sepsis due to cellulitis ([place]) 01/15/2021   Prolonged QT interval 01/15/2021   Pulmonary nodules 01/15/2021   AKI (acute kidney injury) ([place]) 01/15/2021   Open thigh wound    Citrobacter infection    Pseudomonas infection    Pressure injury of skin 12/21/2020   Fall    Hyperbilirubinemia    Malignant melanoma ([place])    Cellulitis 12/20/2020    Blisters of multiple sites 11/05/2020   Goals of care, counseling/discussion 10/12/2020   Atherosclerosis of aorta ([place]) 07/24/2020   Symptomatic anemia 05/27/2020   Pulmonary embolus ([place]) 05/27/2020   Acute respiratory failure with hypoxia ([place]) 05/26/2020   Callus of foot 04/21/2018   Edema 03/27/2018   Toxic gastroenteritis and colitis 03/25/2018   Other ulcerative 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 ([place]) 34/91/7915   Diarrhea 12/23/2017   Nausea & vomiting 12/23/2017   Herpes zoster 07/07/2017   Constipation 04/21/2017   Genetic testing 03/06/2017   Family history of cancer    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[place]) 02/12/2017   Iron deficiency anemia due to chronic blood loss 12/27/2016   Scalp wound 06/13/2016   Scalp laceration, subsequent encounter 06/04/2016   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   Pain in joint, ankle and foot 09/28/2015   Anemia due to chronic blood loss 09/28/2015   Hyponatremia 09/28/2015   Cramp in limb 09/28/2015   Exertional angina ([place]) 06/17/2014   DOE (dyspnea on exertion) 04/28/2014   Hypokalemia 03/11/2014   Counseling regarding goals of care 12/31/2013   Impaired glucose tolerance 12/31/2013   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[place]) 11/18/2013   Fever 11/18/2013   Leukocytosis 10/30/2013   Fatigue 10/22/2013   Hoarseness of voice 08/10/2013   Sleep disorder 07/05/2013   Tension headache 07/01/2013   Polypharmacy 06/19/2013   Lumbar post-laminectomy syndrome 05/27/2013   Degenerative arthritis of spine 05/26/2013   Hemochromatosis 05/26/2013   Wound infection after surgery 05/26/2013   Normocytic anemia 05/26/2013   Unintentional weight loss 05/26/2013   Protein-calorie malnutrition, severe ([place]) 05/25/2013   Chronic pain syndrome 05/23/2013   COPD (chronic obstructive pulmonary disease) ([place]) 05/06/2013   Frequent falls 05/06/2013   Encounter for postoperative wound care 05/06/2013   Dehydration 03/17/2013   Atrial fibrillation  ([place]) 08/07/2010   Rheumatoid arthritis ([place]) 10/18/2008   Anxiety 11/09/2007   Elevated lipids 03/30/2007   PSEUDOGOUT 03/30/2007  Essential hypertension 03/30/2007   Coronary atherosclerosis 03/30/2007    Palliative Care Assessment & Plan   Patient Profile: 75 y.o. male  with past medical history of malignant melanoma, A. fib, hypertension, RA admitted on 01/27/2021 with worsening cough and shortness of breath from skilled facility.  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  He is also RSV positive.  Dr. Marin Olp has evaluated patient and recommendation for hospice.  Palliative consulted for goals of care/facilitation of home hospice.   Recommendations/Plan: DNR/DNI Goal moving forward is for comfort We discussed again possibility of [facility], but there is not a bed available today.  We will continue to assess daily to determine if he is stable enough to consider transfer at a point when a bed does become available. Pain/shortness of breath: Continue fentanyl patch for baseline medication.  Increased dose of Dilaudid to 1 to 2 mg every 2 hours as needed for breakthrough pain.  If he continues to have increasing needs, I would have a low threshold to consider continuous infusion if it is needed to ensure that he is comfortable. Anxiety: Continue Xanax (or Ativan if he cannot swallow) as needed  Goals of Care and Additional Recommendations: Limitations on Scope of Treatment: Full Comfort Care  Code Status:    Code Status Orders  (From admission, onward)           Start     Ordered   01/12/2021 1851  Do not attempt resuscitation (DNR)  Continuous       Question Answer Comment  In the event of cardiac or respiratory ARREST Do not call a "code blue"   In the event of cardiac or respiratory ARREST Do not perform Intubation, CPR, defibrillation or ACLS   In the event of cardiac or respiratory ARREST Use medication by any route, position, wound care, and other  measures to relive pain and suffering. May use oxygen, suction and manual treatment of airway obstruction as needed for comfort.   Comments DNR confirmed with patient      01/11/2021 1850           Code Status History     Date Active Date Inactive Code Status Order ID Comments User Context   01/15/2021 1556 01/25/2021 0221 DNR [id number]  Reubin Milan, MD ED   12/20/2020 1800 12/27/2020 1732 DNR [id number]  Jonnie Finner, DO Inpatient   05/27/2020 0125 05/29/2020 1847 DNR [id number]  Lenore Cordia, MD Inpatient   02/12/2017 1815 02/13/2017 1604 Full Code [id number]  Stark Klein, MD Inpatient   06/17/2014 0948 06/18/2014 1334 Full Code [id number]  Sherren Mocha, MD Inpatient   10/30/2013 0052 11/01/2013 1341 Full Code [id number]  Shanda Howells, MD ED   06/16/2013 2154 06/19/2013 1600 Full Code [id number]  Merton Border, MD Inpatient   06/08/2013 1534 06/10/2013 1911 Full Code [id number]  Luanne Bras, MD Inpatient   05/27/2013 1642 06/08/2013 1534 Full Code [id number]  Cathlyn Parsons, PA-C Inpatient   05/27/2013 1642 05/27/2013 1642 Full Code [id number]  Cathlyn Parsons, PA-C Inpatient   05/23/2013 2239 05/27/2013 1642 Full Code [id number]  Jonetta Osgood, MD Inpatient   05/06/2013 1552 05/09/2013 1603 Full Code [id number]  Robbie Lis, MD ED   04/13/2013 1713 04/24/2013 2037 Full Code [id number]  Orvan Falconer, MD Inpatient   03/16/2013 1650 04/06/2013 1626 Full Code [id number]  Floyce Stakes, MD Inpatient   03/02/2013 1741 03/10/2013 2021 Full Code [id number]  Floyce Stakes, MD Inpatient  Advance Directive Documentation    Flowsheet Row Most Recent Value  Type of Advance Directive Healthcare Power of Attorney  Pre-existing out of facility DNR order (yellow form or pink MOST form) --  "MOST" Form in Place? --       Prognosis: Likely hours to days  Discharge Planning: Anticipated Hospital Death  Care plan was discussed with patient  Thank you for allowing the Palliative  Medicine Team to assist in the care of this patient.   Total Time 20 Prolonged Time Billed No   Greater than 50%  of this time was spent counseling and coordinating care related to the above assessment and plan.  Micheline Rough, MD  Please contact Palliative Medicine Team phone at [phone] for questions and concerns.

## 2021-02-11 NOTE — Progress Notes (Signed)
PROGRESS NOTE    OAKLAN PERSONS  NTI:[id number] DOB: 1945/09/06 DOA: 01/07/2021 PCP: Cassandria Anger, MD   Pt was seen. Late entry for 11/5  Brief Narrative:  75 year old gentleman history of malignant melanoma, A. fib, hypertension, RA presented with cough, dyspnea from skilled nursing facility.  Patient tried on Tylenol and OTC cough medications but with no significant improvement.  Patient recently treated for cellulitis with Zyvox.  CT angiogram chest done with diffuse metastatic lesions noted in the lungs.  Patient noted to have diffuse wheezing.  RSV positive.  COVID-19 PCR was negative, influenza A and B negative.  Patient placed on IV steroids, scheduled nebs, supportive care.   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  Also concern for possible volume overload. -COVID-19 PCR negative.  Influenza A and B negative. Continue bronchodilators as needed and O2 for comfort -Appreciate assistance by palliative care, focus now on full comfort status  2.  Paroxysmal atrial fibrillation -Currently on half home regimen metoprolol for rate control. -Had been on Eliquis, now on comfort status  3.  Hypertension Blood pressure noted to be soft on admission.- -Blood pressure medications now held secondary to [place] status  4.  Anxiety -Continue Ativan as needed.  5.  Normocytic  anemia -Patient remains hemodynamically stable at this time  6.  Metastatic melanoma -CT angiogram chest concern for metastatic melanoma to the lungs. -Patient seen by oncology and patient at this time felt not a candidate for systemic chemotherapy. -Appreciate input by oncology.  Very poor prognosis with patient appearing worse today -Appreciate assistance by palliative care.  Focus is now full comfort  7.  Chronic pain -Continue current pain regimen as BP tolerates.  8.  Rheumatoid arthritis -Stable at this time. -Continue with analgesia as needed  9.  Pressure injury, POA    DVT prophylaxis: Eliquis Code Status: DNR Family Communication: Pt in room, family not at bedside  Status is: Inpatient  Remains inpatient appropriate because: Severity of illness   Consultants:  Palliative Care Oncology  Procedures:    Antimicrobials: Anti-infectives (From admission, onward)    Start     Dose/Rate Route Frequency Ordered Stop   01/12/2021 1430  azithromycin (ZITHROMAX) tablet 500 mg        500 mg Oral  Once 02/03/2021 1418 02/01/2021 1637       Subjective: Confused without complaints  Objective: Vitals:   02/10/21 0441 02/10/21 0649 02/10/21 1300 02/10/21 2034  BP: (!) 154/79  133/70 (!) 152/79  Pulse: 87  87 (!) 108  Resp: 20  20 18   Temp: 98.6 F (37 C)  98 F (36.7 C) 98.6 F (37 C)  TempSrc: Oral  Axillary Oral  SpO2: 97% 97% (!) 75% 93%  Weight:      Height:        Intake/Output Summary (Last 24 hours) at 02/11/2021 1555 Last data filed at 02/11/2021 1100 Gross per 24 hour  Intake --  Output 1 ml  Net -1 ml    Filed Weights   01/18/2021 0930  Weight: 61.7 kg    Examination: General exam: Conversant, in no acute distress Respiratory system: normal chest rise, clear, no audible wheezing  Data Reviewed: I have personally reviewed following labs and imaging studies  CBC: Recent Labs  Lab 01/30/2021 1029 02/06/21 0438 02/07/21 0433  WBC 4.7 6.0 3.4*   NEUTROABS 3.4  --  3.0  HGB 9.0* 8.9* 8.8*  HCT 29.7* 29.4* 28.4*  MCV 97.4 96.1 95.3  PLT 138* 147* 137*    Basic Metabolic Panel: Recent Labs  Lab 01/28/2021 1029 02/06/21 0438 02/07/21 0433  NA 133* 135 138  K 4.0 4.4 4.2  CL 95* 97* 99  CO2 34* 32 37*  GLUCOSE 103* 102* 159*  BUN 18 20 23   CREATININE 0.89 0.61 0.71  CALCIUM 7.9* 8.1* 8.1*  MG  --   --  2.1    GFR: Estimated Creatinine Clearance: 71.8 mL/min (by C-G formula based on SCr of 0.71 mg/dL). Liver Function Tests: Recent Labs  Lab 02/04/2021 1029 02/06/21 0438  AST 24 23  ALT 18 20  ALKPHOS 40 43  BILITOT 0.7 0.5  PROT 5.3* 5.4*  ALBUMIN 2.9* 2.9*    No results for input(s): LIPASE, AMYLASE in the last 168 hours. No results for input(s): AMMONIA in the last 168 hours. Coagulation Profile: Recent Labs  Lab 01/20/2021 1029  INR 1.9*    Cardiac Enzymes: No results for input(s): CKTOTAL, CKMB, CKMBINDEX, TROPONINI in the last 168 hours. BNP (last 3 results) No results for input(s): PROBNP in the last 8760 hours. HbA1C: No results for input(s): HGBA1C in the last 72 hours. CBG: No results for input(s): GLUCAP in the last 168 hours. Lipid Profile: No results for input(s): CHOL, HDL, LDLCALC, TRIG, CHOLHDL, LDLDIRECT in the last 72 hours. Thyroid Function Tests: No results for input(s): TSH, T4TOTAL, FREET4, T3FREE, THYROIDAB in the last 72 hours. Anemia Panel: No results for input(s): VITAMINB12, FOLATE, FERRITIN, TIBC, IRON, RETICCTPCT in the last 72 hours.  Sepsis Labs: Recent Labs  Lab 01/19/2021 1029  PROCALCITON <0.10  LATICACIDVEN 1.3     Recent Results (from the past 240 hour(s))  Resp Panel by RT-PCR (Flu A&B, Covid) Nasopharyngeal Swab     Status: None   Collection Time: 01/18/2021 10:29 AM   Specimen: Nasopharyngeal Swab; Nasopharyngeal(NP) swabs in vial transport medium  Result Value Ref Range Status   SARS Coronavirus 2 by RT PCR NEGATIVE NEGATIVE Final    Comment:  (NOTE) SARS-CoV-2 target nucleic acids are NOT DETECTED.  The SARS-CoV-2 RNA is generally detectable in upper respiratory specimens during the acute phase of infection. The lowest concentration of SARS-CoV-2 viral copies this assay can detect is 138 copies/mL. A negative result does not preclude SARS-Cov-2 infection and should not be used as the sole basis for treatment or other patient management decisions. A negative result may occur with  improper specimen collection/handling, submission of specimen other than nasopharyngeal swab, presence of viral mutation(s) within the areas targeted by this assay, and inadequate number of viral copies(<138 copies/mL). A negative result must be combined with clinical observations, patient history, and epidemiological information. The expected result is Negative.  Fact Sheet for Patients:  EntrepreneurPulse.com.au  Fact Sheet for Healthcare Providers:  IncredibleEmployment.be  This test is no t yet approved or cleared by the [place] FDA and  has been authorized for detection and/or diagnosis of SARS-CoV-2 by FDA under an Emergency  Use Authorization (EUA). This EUA will remain  in effect (meaning this test can be used) for the duration of the COVID-19 declaration under Section 564(b)(1) of the Act, 21 U.S.C.section 360bbb-3(b)(1), unless the authorization is terminated  or revoked sooner.       Influenza A by PCR NEGATIVE NEGATIVE Final   Influenza B by PCR NEGATIVE NEGATIVE Final    Comment: (NOTE) The Xpert Xpress SARS-CoV-2/FLU/RSV plus assay is intended as an aid in the diagnosis of influenza from Nasopharyngeal swab specimens and should not be used as a sole basis for treatment. Nasal washings and aspirates are unacceptable for Xpert Xpress SARS-CoV-2/FLU/RSV testing.  Fact Sheet for Patients: EntrepreneurPulse.com.au  Fact Sheet for Healthcare  Providers: IncredibleEmployment.be  This test is not yet approved or cleared by the [place] FDA and has been authorized for detection and/or diagnosis of SARS-CoV-2 by FDA under an Emergency Use Authorization (EUA). This EUA will remain in effect (meaning this test can be used) for the duration of the COVID-19 declaration under Section 564(b)(1) of the Act, 21 U.S.C. section 360bbb-3(b)(1), unless the authorization is terminated or revoked.  Performed at [facility], [place] [address], [place], [place] [address]   Blood Culture (routine x 2)     Status: None   Collection Time: 01/21/2021 10:29 AM   Specimen: BLOOD LEFT FOREARM  Result Value Ref Range Status   Specimen Description   Final    BLOOD LEFT FOREARM Performed at [facility] [address] [place], [place] [address]    Special Requests   Final    BOTTLES DRAWN AEROBIC AND ANAEROBIC Blood Culture results may not be optimal due to an inadequate volume of blood received in culture bottles Performed at [place] [address] [place], [place] [address]    Culture   Final    NO GROWTH 5 DAYS Performed at [facility], [place] [address] [place], [place] [address]    Report Status 02/10/2021 FINAL  Final  Blood Culture (routine x 2)     Status: None   Collection Time: 01/22/2021 10:29 AM   Specimen: BLOOD  Result Value Ref Range Status   Specimen Description   Final    BLOOD RIGHT ANTECUBITAL Performed at [facility] [address], [place], [place] [address]    Special Requests   Final    BOTTLES DRAWN AEROBIC AND ANAEROBIC Blood Culture results may not be optimal due to an inadequate volume of blood received in culture bottles Performed at [place] [address], [place], [place] [address]    Culture   Final    NO GROWTH 5 DAYS Performed at [facility], [place] [address]  [place],  [address]    Report Status 02/10/2021 FINAL  Final  Respiratory (~20 pathogens) panel by PCR     Status: Abnormal   Collection Time: 01/18/2021  9:11 PM   Specimen: Nasopharyngeal Swab; Respiratory  Result Value Ref Range Status   Adenovirus NOT DETECTED NOT DETECTED Final   Coronavirus 229E NOT DETECTED NOT DETECTED Final    Comment: (NOTE) The Coronavirus on the Respiratory Panel, DOES NOT test for the novel  Coronavirus (2019 nCoV)    Coronavirus HKU1 NOT DETECTED NOT DETECTED Final   Coronavirus NL63 NOT DETECTED NOT DETECTED Final   Coronavirus OC43 NOT DETECTED NOT DETECTED Final   Metapneumovirus NOT DETECTED NOT DETECTED Final   Rhinovirus / Enterovirus NOT DETECTED  NOT DETECTED Final   Influenza A NOT DETECTED NOT DETECTED Final   Influenza B NOT DETECTED NOT DETECTED Final   Parainfluenza Virus 1 NOT DETECTED NOT DETECTED Final   Parainfluenza Virus 2 NOT DETECTED NOT DETECTED Final   Parainfluenza Virus 3 NOT DETECTED NOT DETECTED Final   Parainfluenza Virus 4 NOT DETECTED NOT DETECTED Final   Respiratory Syncytial Virus DETECTED (A) NOT DETECTED Final   Bordetella pertussis NOT DETECTED NOT DETECTED Final   Bordetella Parapertussis NOT DETECTED NOT DETECTED Final   Chlamydophila pneumoniae NOT DETECTED NOT DETECTED Final   Mycoplasma pneumoniae NOT DETECTED NOT DETECTED Final    Comment: Performed at [facility], [place] [address], [place], [place] [address]      Radiology Studies: No results found.  Scheduled Meds:  collagenase   Topical Daily   fentaNYL  1 patch Transdermal Q72H   Continuous Infusions:   LOS: 6 days   Marylu Lund, MD [facility] Pager On Amion  If 7PM-7AM, please contact night-coverage 02/11/2021, 3:55 PM

## 2021-02-11 NOTE — Progress Notes (Addendum)
PROGRESS NOTE    [name]  EVO:[id number] DOB: [birth year] DOA: 01/25/2021 PCP: Cassandria Anger, MD   Brief Narrative:  75 year old gentleman history of malignant melanoma, A. fib, hypertension, RA presented with cough, dyspnea from skilled nursing facility.  Patient tried on Tylenol and OTC cough medications but with no significant improvement.  Patient recently treated for cellulitis with Zyvox.  CT angiogram chest done with diffuse metastatic lesions noted in the lungs.  Patient noted to have diffuse wheezing.  RSV positive.  COVID-19 PCR was negative, influenza A and B negative.  Patient placed on IV steroids, scheduled nebs, supportive care.   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  Also concern for possible volume overload. -COVID-19 PCR negative.  Influenza A and B negative. -continue O2 for comfort purposes  -Appreciate assistance by palliative care, focus now on full comfort status  2.  Paroxysmal atrial fibrillation -Currently on half home regimen metoprolol for rate control. -Had been on Eliquis, now on comfort status  3.  Hypertension Blood pressure noted to be soft on admission.- -Blood pressure medications now held secondary to [place] status  4.  Anxiety -Continue Ativan as needed.  5.  Normocytic anemia -Patient remains hemodynamically stable at this time  6.   Metastatic melanoma -CT angiogram chest concern for metastatic melanoma to the lungs. -Patient seen by oncology and patient at this time felt not a candidate for systemic chemotherapy. -Appreciate input by oncology.  Very poor prognosis with patient appearing worse today -Appreciate assistance by palliative care.  Focus is now full comfort  7.  Chronic pain -Continue current pain regimen as BP tolerates.  8.  Rheumatoid arthritis -Stable at this time. -Continue with analgesia as needed  9.  Pressure injury, POA    DVT prophylaxis: Eliquis Code Status: DNR Family Communication: Pt in room, family not at bedside  Status is: Inpatient  Remains inpatient appropriate because: Severity of illness   Consultants:  Palliative Care Oncology  Procedures:    Antimicrobials: Anti-infectives (From admission, onward)    Start     Dose/Rate Route Frequency Ordered Stop   01/20/2021 1430  azithromycin (ZITHROMAX) tablet 500 mg        500 mg Oral  Once 02/01/2021 1418 02/02/2021 1637       Subjective: No complaints today. Pt seen with Palliative Care  Objective: Vitals:   02/10/21 0441 02/10/21 0649 02/10/21 1300 02/10/21 2034  BP: (!) 154/79  133/70 (!) 152/79  Pulse: 87  87 (!) 108  Resp: 20  20 18   Temp: 98.6 F (37 C)  98 F (36.7 C) 98.6 F (37 C)  TempSrc: Oral  Axillary Oral  SpO2: 97% 97% (!) 75% 93%  Weight:      Height:        Intake/Output Summary (Last 24 hours) at 02/11/2021 1557 Last data filed at 02/11/2021 1100 Gross per 24 hour  Intake --  Output 1 ml  Net -  1 ml    Filed Weights   01/13/2021 0930  Weight: 61.7 kg    Examination: General exam: Conversant, in no acute distress Respiratory system: normal chest rise, clear, no audible wheezing  Data Reviewed: I have personally reviewed following labs and imaging studies  CBC: Recent Labs  Lab 01/11/2021 1029 02/06/21 0438 02/07/21 0433  WBC 4.7 6.0 3.4*  NEUTROABS 3.4  --  3.0  HGB 9.0* 8.9* 8.8*   HCT 29.7* 29.4* 28.4*  MCV 97.4 96.1 95.3  PLT 138* 147* 137*    Basic Metabolic Panel: Recent Labs  Lab 01/29/2021 1029 02/06/21 0438 02/07/21 0433  NA 133* 135 138  K 4.0 4.4 4.2  CL 95* 97* 99  CO2 34* 32 37*  GLUCOSE 103* 102* 159*  BUN 18 20 23   CREATININE 0.89 0.61 0.71  CALCIUM 7.9* 8.1* 8.1*  MG  --   --  2.1    GFR: Estimated Creatinine Clearance: 71.8 mL/min (by C-G formula based on SCr of 0.71 mg/dL). Liver Function Tests: Recent Labs  Lab 02/04/2021 1029 02/06/21 0438  AST 24 23  ALT 18 20  ALKPHOS 40 43  BILITOT 0.7 0.5  PROT 5.3* 5.4*  ALBUMIN 2.9* 2.9*    No results for input(s): LIPASE, AMYLASE in the last 168 hours. No results for input(s): AMMONIA in the last 168 hours. Coagulation Profile: Recent Labs  Lab 01/07/2021 1029  INR 1.9*    Cardiac Enzymes: No results for input(s): CKTOTAL, CKMB, CKMBINDEX, TROPONINI in the last 168 hours. BNP (last 3 results) No results for input(s): PROBNP in the last 8760 hours. HbA1C: No results for input(s): HGBA1C in the last 72 hours. CBG: No results for input(s): GLUCAP in the last 168 hours. Lipid Profile: No results for input(s): CHOL, HDL, LDLCALC, TRIG, CHOLHDL, LDLDIRECT in the last 72 hours. Thyroid Function Tests: No results for input(s): TSH, T4TOTAL, FREET4, T3FREE, THYROIDAB in the last 72 hours. Anemia Panel: No results for input(s): VITAMINB12, FOLATE, FERRITIN, TIBC, IRON, RETICCTPCT in the last 72 hours.  Sepsis Labs: Recent Labs  Lab 01/11/2021 1029  PROCALCITON <0.10  LATICACIDVEN 1.3     Recent Results (from the past 240 hour(s))  Resp Panel by RT-PCR (Flu A&B, Covid) Nasopharyngeal Swab     Status: None   Collection Time: 01/08/2021 10:29 AM   Specimen: Nasopharyngeal Swab; Nasopharyngeal(NP) swabs in vial transport medium  Result Value Ref Range Status   SARS Coronavirus 2 by RT PCR NEGATIVE NEGATIVE Final    Comment: (NOTE) SARS-CoV-2 target nucleic acids are NOT  DETECTED.  The SARS-CoV-2 RNA is generally detectable in upper respiratory specimens during the acute phase of infection. The lowest concentration of SARS-CoV-2 viral copies this assay can detect is 138 copies/mL. A negative result does not preclude SARS-Cov-2 infection and should not be used as the sole basis for treatment or other patient management decisions. A negative result may occur with  improper specimen collection/handling, submission of specimen other than nasopharyngeal swab, presence of viral mutation(s) within the areas targeted by this assay, and inadequate number of viral copies(<138 copies/mL). A negative result must be combined with clinical observations, patient history, and epidemiological information. The expected result is Negative.  Fact Sheet for Patients:  EntrepreneurPulse.com.au  Fact Sheet for Healthcare Providers:  IncredibleEmployment.be  This test is no t yet approved or cleared by the [place] FDA and  has been authorized for detection and/or diagnosis of SARS-CoV-2 by FDA under an Emergency Use Authorization (EUA). This EUA  will remain  in effect (meaning this test can be used) for the duration of the COVID-19 declaration under Section 564(b)(1) of the Act, 21 U.S.C.section 360bbb-3(b)(1), unless the authorization is terminated  or revoked sooner.       Influenza A by PCR NEGATIVE NEGATIVE Final   Influenza B by PCR NEGATIVE NEGATIVE Final    Comment: (NOTE) The Xpert Xpress SARS-CoV-2/FLU/RSV plus assay is intended as an aid in the diagnosis of influenza from Nasopharyngeal swab specimens and should not be used as a sole basis for treatment. Nasal washings and aspirates are unacceptable for Xpert Xpress SARS-CoV-2/FLU/RSV testing.  Fact Sheet for Patients: EntrepreneurPulse.com.au  Fact Sheet for Healthcare Providers: IncredibleEmployment.be  This test is not yet  approved or cleared by the [place] FDA and has been authorized for detection and/or diagnosis of SARS-CoV-2 by FDA under an Emergency Use Authorization (EUA). This EUA will remain in effect (meaning this test can be used) for the duration of the COVID-19 declaration under Section 564(b)(1) of the Act, 21 U.S.C. section 360bbb-3(b)(1), unless the authorization is terminated or revoked.  Performed at [facility], [place] [address] [place], [place] [address]   Blood Culture (routine x 2)     Status: None   Collection Time: 01/20/2021 10:29 AM   Specimen: BLOOD LEFT FOREARM  Result Value Ref Range Status   Specimen Description   Final    BLOOD LEFT FOREARM Performed at [facility] [address], [place], [place] [address]    Special Requests   Final    BOTTLES DRAWN AEROBIC AND ANAEROBIC Blood Culture results may not be optimal due to an inadequate volume of blood received in culture bottles Performed at [facility] [address] [place], [place] [address]    Culture   Final    NO GROWTH 5 DAYS Performed at [facility], [place] [address] [place], [place] [address]    Report Status 02/10/2021 FINAL  Final  Blood Culture (routine x 2)     Status: None   Collection Time: 01/22/2021 10:29 AM   Specimen: BLOOD  Result Value Ref Range Status   Specimen Description   Final    BLOOD RIGHT ANTECUBITAL Performed at [facility] [address], [place], [place] [address]    Special Requests   Final    BOTTLES DRAWN AEROBIC AND ANAEROBIC Blood Culture results may not be optimal due to an inadequate volume of blood received in culture bottles Performed at [facility] [address], [place], [place] [address]    Culture   Final    NO GROWTH 5 DAYS Performed at [facility], [place] [address] [place],  [address]    Report Status 02/10/2021 FINAL  Final  Respiratory (~20  pathogens) panel by PCR     Status: Abnormal   Collection Time: 01/11/2021  9:11 PM   Specimen: Nasopharyngeal Swab; Respiratory  Result Value Ref Range Status   Adenovirus NOT DETECTED NOT DETECTED Final   Coronavirus 229E NOT DETECTED NOT DETECTED Final    Comment: (NOTE) The Coronavirus on the Respiratory Panel, DOES NOT test for the novel  Coronavirus (2019 nCoV)    Coronavirus HKU1 NOT DETECTED NOT DETECTED Final   Coronavirus NL63 NOT DETECTED NOT DETECTED Final   Coronavirus OC43 NOT DETECTED NOT DETECTED Final   Metapneumovirus NOT DETECTED NOT DETECTED Final   Rhinovirus / Enterovirus NOT DETECTED NOT DETECTED Final  Influenza A NOT DETECTED NOT DETECTED Final   Influenza B NOT DETECTED NOT DETECTED Final   Parainfluenza Virus 1 NOT DETECTED NOT DETECTED Final   Parainfluenza Virus 2 NOT DETECTED NOT DETECTED Final   Parainfluenza Virus 3 NOT DETECTED NOT DETECTED Final   Parainfluenza Virus 4 NOT DETECTED NOT DETECTED Final   Respiratory Syncytial Virus DETECTED (A) NOT DETECTED Final   Bordetella pertussis NOT DETECTED NOT DETECTED Final   Bordetella Parapertussis NOT DETECTED NOT DETECTED Final   Chlamydophila pneumoniae NOT DETECTED NOT DETECTED Final   Mycoplasma pneumoniae NOT DETECTED NOT DETECTED Final    Comment: Performed at [facility], [place] [address], [place], [place] [address]      Radiology Studies: No results found.  Scheduled Meds:  collagenase   Topical Daily   fentaNYL  1 patch Transdermal Q72H   Continuous Infusions:   LOS: 6 days   Marylu Lund, MD [facility] Pager On Amion  If 7PM-7AM, please contact night-coverage 02/11/2021, 3:57 PM

## 2021-02-11 NOTE — Progress Notes (Signed)
Manufacturing engineer [facility]) [place]  Unfortunately, we do not have any bed availability at [facility].  [facility] will update family and hospital should our bed status change.  Thank you, Venia Carbon RN, BSN [facility] Liaison

## 2021-02-11 NOTE — Progress Notes (Signed)
Palliative Care Brief Note  I stopped by to check in on [name] in regard to increased pain he was having earlier today.    He appears weaker but more restless.  He has been waking in pain.  He has used 74m of IV dilaudid in the last 5 hours and is still reporting pain.  He is agreeable to starting continuous infusion to better control his pain.  I called and talked to his daughter about plan and she is in agreement.  Also informed by staff and his daughter that DJackelyn Poling(his significant other) is currently in the ED and being admitted for PNA.  I told Melissa that we would do our best to coordinate so DJackelyn Polingcan visit with him tomorrow if she is clinically stable to do so.   GMicheline Rough MD [facility] [phone]

## 2021-02-11 NOTE — TOC Progression Note (Signed)
Transition of Care [facility]) - Progression Note    Patient Details  Name: [name] NEES MRN: [id number] Date of Birth: Feb 10, 1946  Transition of Care [facility] CM/SW Contact  Ross Ludwig, [place] Phone Number: 02/11/2021, 11:39 AM  Clinical Narrative:    Patient waiting on a bed at [facility] [place] to continue to follow patient's progress throughout discharge planning.   Expected Discharge Plan: [place] Barriers to Discharge: Continued Medical Work up  Expected Discharge Plan and Services Expected Discharge Plan: [place]   Discharge Planning Services: CM Consult Post Acute Care Choice: Hospice Living arrangements for the past 2 months: [place] Expected Discharge Date:  (unknown)                                     Social Determinants of Health (SDOH) Interventions    Readmission Risk Interventions Readmission Risk Prevention Plan 01/17/2021  Transportation Screening Complete  HRI or Home Care Consult Complete  SW Recovery Care/Counseling Consult Complete  Palliative Care Screening (No Data)  Some recent data might be hidden

## 2021-02-11 NOTE — Progress Notes (Signed)
Patient stated the pain medication is not helping his pain, Notified MD, new order received.

## 2021-02-11 NOTE — Progress Notes (Signed)
Surprisingly, [name] is still here.  I really thought that he was going to pass away Friday or Saturday.  He is certainly more alert.  He is talking with me.  He is definitely is weak.  There is no dyspnea.  He is on supplemental oxygen.  I guess the issue is whether or not he can be moved to [facility].  I think this is still somewhat in question.  He is not eating.  He says he is having bad dreams.  He does not seem to be coughing as much.   His vital signs show temperature 98.6.  Pulse 108.  Blood pressure 152/79.  His lungs sound congested.  He has some wheezing bilaterally.  He has decent air movement bilaterally.  Cardiac exam is tachycardic but regular.  He has an occasional extra beat.  Again, I am surprised that [name] is still with this.  I think if he is still stable, tomorrow, then we might think about moving him over to [facility].  I will have to talk to his significant other about this.  As always, we had a very good prayer session.  [name], MD  2 Timothy 4:6-8

## 2021-02-12 DIAGNOSIS — C7801 Secondary malignant neoplasm of right lung: Secondary | ICD-10-CM | POA: Diagnosis not present

## 2021-02-12 DIAGNOSIS — C7800 Secondary malignant neoplasm of unspecified lung: Secondary | ICD-10-CM | POA: Diagnosis not present

## 2021-02-12 DIAGNOSIS — G893 Neoplasm related pain (acute) (chronic): Secondary | ICD-10-CM | POA: Diagnosis not present

## 2021-02-12 DIAGNOSIS — I10 Essential (primary) hypertension: Secondary | ICD-10-CM | POA: Diagnosis not present

## 2021-02-12 DIAGNOSIS — C4372 Malignant melanoma of left lower limb, including hip: Secondary | ICD-10-CM | POA: Diagnosis not present

## 2021-02-12 DIAGNOSIS — J441 Chronic obstructive pulmonary disease with (acute) exacerbation: Secondary | ICD-10-CM | POA: Diagnosis not present

## 2021-02-12 DIAGNOSIS — J45901 Unspecified asthma with (acute) exacerbation: Secondary | ICD-10-CM | POA: Diagnosis not present

## 2021-02-12 DIAGNOSIS — J9600 Acute respiratory failure, unspecified whether with hypoxia or hypercapnia: Secondary | ICD-10-CM | POA: Diagnosis not present

## 2021-02-12 NOTE — Progress Notes (Signed)
[facility]) Hospital Liaison Note  [facility] is unable to offer a room today. Notified TOC Dessa Phi, RN.  Spoke with Dr. Domingo Cocking, he feels patient is too unstable to transfer at this time. Patient's wife is also admitted to the hospital and not transferring allows them to spend time together. Hospital death is anticipated.  Attempted to call daughter, no answer, left voicemail for support.  We will continue to follow on the periphery, should the plan change and an inpatient hospice bed is requested.  Please call with any hospice questions or concerns.  Thank you. Margaretmary Eddy, BSN, RN [facility] Liaison [phone]

## 2021-02-12 NOTE — Progress Notes (Signed)
Chaplain spent time with family over several visits (including in the ED to visit [name] where she was a patient).  Chaplain provided ministry of presence and facilitated sharing of stories and memories.  Chaplain also provided reflective listening with [name] as she thought about how and when to do a service for [name] at [place], where they currently live.    [place], [place] Pager, [phone] 4:28 PM

## 2021-02-12 NOTE — Progress Notes (Signed)
Daily Progress Note   Patient Name: [name]       Date: 02/12/2021 DOB: Feb 08, 1946  Age: 75 y.o. MRN#: [id number] Attending Physician: Donne Hazel, MD Primary Care Physician: Cassandria Anger, MD Admit Date: 01/16/2021  Reason for Consultation/Follow-up: Establishing goals of care  Subjective: I saw and examined [name] today.  He was very sleepy but was comfortable and wife and daughter at bedside  Length of Stay: 7  Current Medications: Scheduled Meds:   collagenase   Topical Daily   fentaNYL  1 patch Transdermal Q72H    Continuous Infusions:  HYDROmorphone 0.5 mg/hr (02/12/21 0735)    PRN Meds: ALPRAZolam **OR** LORazepam, glycopyrrolate, HYDROcodone bit-homatropine, HYDROmorphone, ipratropium-albuterol, lip balm, sodium chloride flush  Physical Exam         General: Alert, awake, some excess secretions.  Chronically ill-appearing.  Weak and frail HEENT: No bruits, no goiter, no JVD Heart: Regular rate and rhythm. No murmur appreciated. Lungs: Fair air movement Abdomen: Soft, nontender, nondistended, positive bowel sounds.   Ext: No significant edema Skin: Warm and dry Neuro: Grossly intact, nonfocal.  Vital Signs: BP (!) 152/79 (BP Location: Right Arm)   Pulse (!) 108   Temp 98.6 F (37 C) (Oral)   Resp 18   Ht 5' 6"  (1.676 m)   Wt 61.7 kg   SpO2 93%   BMI 21.95 kg/m  SpO2: SpO2: 93 % O2 Device: O2 Device: Nasal Cannula O2 Flow Rate: O2 Flow Rate (L/min): 3 L/min  Intake/output summary:  Intake/Output Summary (Last 24 hours) at 02/12/2021 1041 Last data filed at 02/12/2021 0735 Gross per 24 hour  Intake 10.22 ml  Output 2 ml  Net 8.22 ml    LBM: Last BM Date: 02/07/21 Baseline Weight: Weight: 61.7 kg Most recent weight: Weight:  61.7 kg       Palliative Assessment/Data:    Flowsheet Rows    Flowsheet Row Most Recent Value  Intake Tab   Referral Department Hospitalist  Unit at Time of Referral Med/Surg Unit  Palliative Care Primary Diagnosis Cancer  Date Notified 01/06/2021  Palliative Care Type New Palliative care  Reason for referral Clarify Goals of Care  Date of Admission 02/04/2021  Date first seen by Palliative Care 02/06/21  # of days Palliative referral response time 1 Day(s)  #  of days IP prior to Palliative referral 0  Clinical Assessment   Palliative Performance Scale Score 40%  Psychosocial & Spiritual Assessment   Palliative Care Outcomes   Patient/Family meeting held? Yes  Who was at the meeting? 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 ([place]) 02/06/2021   Malignant neoplasm metastatic to both lungs (HCC)    Acute respiratory failure ([place]) 01/20/2021   Palliative care by specialist    General weakness    Sepsis ([place]) 01/16/2021   Sepsis due to cellulitis ([place]) 01/15/2021   Prolonged QT interval 01/15/2021   Pulmonary nodules 01/15/2021   AKI (acute kidney injury) ([place]) 01/15/2021   Open thigh wound    Citrobacter infection    Pseudomonas infection    Pressure injury of skin 12/21/2020   Fall    Hyperbilirubinemia    Malignant melanoma ([place])    Cellulitis 12/20/2020   Blisters of multiple sites 11/05/2020   Goals of care, counseling/discussion 10/12/2020   Atherosclerosis of aorta ([place]) 07/24/2020   Symptomatic anemia 05/27/2020   Pulmonary embolus ([place]) 05/27/2020   Acute respiratory failure with hypoxia ([place]) 05/26/2020   Callus of foot 04/21/2018   Edema 03/27/2018   Toxic gastroenteritis and colitis 03/25/2018   Other ulcerative 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 ([place]) 78/24/2353   Diarrhea 12/23/2017   Nausea & vomiting 12/23/2017   Herpes zoster 07/07/2017    Constipation 04/21/2017   Genetic testing 03/06/2017   Family history of cancer    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[place]) 02/12/2017   Iron deficiency anemia due to chronic blood loss 12/27/2016   Scalp wound 06/13/2016   Scalp laceration, subsequent encounter 06/04/2016   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   Pain in joint, ankle and foot 09/28/2015   Anemia due to chronic blood loss 09/28/2015   Hyponatremia 09/28/2015   Cramp in limb 09/28/2015   Exertional angina ([place]) 06/17/2014   DOE (dyspnea on exertion) 04/28/2014   Hypokalemia 03/11/2014   Counseling regarding goals of care 12/31/2013   Impaired glucose tolerance 12/31/2013   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[place]) 11/18/2013   Fever 11/18/2013   Leukocytosis 10/30/2013   Fatigue 10/22/2013   Hoarseness of voice 08/10/2013   Sleep disorder 07/05/2013   Tension headache 07/01/2013   Polypharmacy 06/19/2013   Lumbar post-laminectomy syndrome 05/27/2013   Degenerative arthritis of spine 05/26/2013   Hemochromatosis 05/26/2013   Wound infection after surgery 05/26/2013   Normocytic anemia 05/26/2013   Unintentional weight loss 05/26/2013   Protein-calorie malnutrition, severe ([place]) 05/25/2013   Chronic pain syndrome 05/23/2013   COPD (chronic obstructive pulmonary disease) ([place]) 05/06/2013   Frequent falls 05/06/2013   Encounter for postoperative wound care 05/06/2013   Dehydration 03/17/2013   Atrial fibrillation ([place]) 08/07/2010   Rheumatoid arthritis ([place]) 10/18/2008   Anxiety 11/09/2007   Elevated lipids 03/30/2007   PSEUDOGOUT 03/30/2007   Essential hypertension 03/30/2007   Coronary atherosclerosis 03/30/2007    Palliative Care Assessment & Plan   Patient Profile: 75 y.o. male  with past medical history of malignant melanoma, A. fib, hypertension, RA admitted on 02/04/2021 with worsening cough and shortness of breath from skilled facility.  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  He is  also RSV positive.  Dr. Marin Olp has evaluated patient and recommendation for hospice.  Palliative consulted for goals of care/facilitation of home hospice.   Recommendations/Plan: DNR/DNI  Goal moving forward is for comfort He is no longer stable to consider transfer from the hospital.  This will be a hospital death Pain/shortness of breath: Improved today.  Continue Dilaudid infusion with additional Dilaudid as needed for breakthrough symptoms Anxiety: Continue Xanax (or Ativan if he cannot swallow) as needed  Goals of Care and Additional Recommendations: Limitations on Scope of Treatment: Full Comfort Care  Code Status:    Code Status Orders  (From admission, onward)           Start     Ordered   01/10/2021 1851  Do not attempt resuscitation (DNR)  Continuous       Question Answer Comment  In the event of cardiac or respiratory ARREST Do not call a "code blue"   In the event of cardiac or respiratory ARREST Do not perform Intubation, CPR, defibrillation or ACLS   In the event of cardiac or respiratory ARREST Use medication by any route, position, wound care, and other measures to relive pain and suffering. May use oxygen, suction and manual treatment of airway obstruction as needed for comfort.   Comments DNR confirmed with patient      01/25/2021 1850           Code Status History     Date Active Date Inactive Code Status Order ID Comments User Context   01/15/2021 1556 01/25/2021 0221 DNR [id number]  Reubin Milan, MD ED   12/20/2020 1800 12/27/2020 1732 DNR [id number]  Jonnie Finner, DO Inpatient   05/27/2020 0125 05/29/2020 1847 DNR [id number]  Lenore Cordia, MD Inpatient   02/12/2017 1815 02/13/2017 1604 Full Code [id number]  Stark Klein, MD Inpatient   06/17/2014 0948 06/18/2014 1334 Full Code [id number]  Sherren Mocha, MD Inpatient   10/30/2013 0052 11/01/2013 1341 Full Code [id number]  Shanda Howells, MD ED   06/16/2013 2154 06/19/2013 1600 Full Code [id number]  Merton Border, MD Inpatient   06/08/2013 1534 06/10/2013 1911 Full Code [id number]  Luanne Bras, MD Inpatient   05/27/2013 1642 06/08/2013 1534 Full Code [id number]  Cathlyn Parsons, PA-C Inpatient   05/27/2013 1642 05/27/2013 1642 Full Code [id number]  Cathlyn Parsons, PA-C Inpatient   05/23/2013 2239 05/27/2013 1642 Full Code [id number]  Jonetta Osgood, MD Inpatient   05/06/2013 1552 05/09/2013 1603 Full Code [id number]  Robbie Lis, MD ED   04/13/2013 1713 04/24/2013 2037 Full Code [id number]  Orvan Falconer, MD Inpatient   03/16/2013 1650 04/06/2013 1626 Full Code [id number]  Floyce Stakes, MD Inpatient   03/02/2013 1741 03/10/2013 2021 Full Code [id number]  Floyce Stakes, MD Inpatient      Advance Directive Documentation    Flowsheet Row Most Recent Value  Type of Advance Directive Healthcare Power of Attorney  Pre-existing out of facility DNR order (yellow form or pink MOST form) --  "MOST" Form in Place? --       Prognosis: Likely hours to days  Discharge Planning: Anticipated Hospital Death  Care plan was discussed with patient  Thank you for allowing the Palliative Medicine Team to assist in the care of this patient.   Total Time 15 Prolonged Time Billed No  Greater than 50%  of this time was spent counseling and coordinating care related to the above assessment and plan.    Micheline Rough, MD  Please contact Palliative Medicine Team phone at [phone] for questions and concerns.

## 2021-02-12 NOTE — Progress Notes (Signed)
Arrangements made for significant other, [name], to come up from the ED to be with patient.

## 2021-02-12 NOTE — Progress Notes (Signed)
[name] is now on a low-dose Dilaudid infusion.  He appears comfortable.  His "wife" is with him.  He does not appear to have any type of dyspnea.  He is not eating.  There is no pain.  There are periods of apnea.  I really thought that he was not going to make it through the weekend.  It is hard to say what the prognosis is right now.  However, I would be shocked if he made it through this week.  I am glad that he started the Dilaudid infusion.  This is helping his comfort.  I am grateful for the outstanding care that he has received from everybody up on [facility] [address].  I know all the staff have shown a lot of compassion for he and his family.  His "wife" is very grateful for the care that he is received in the hospital.  [name], MD  2 Timothy 4:16-18

## 2021-02-12 NOTE — Progress Notes (Signed)
[name], ER RN called [name], patients daughter.  She is on the way to the hospital to be with patient and Debbie.

## 2021-02-12 NOTE — Progress Notes (Signed)
PROGRESS NOTE    [name]  PZW:[id number] DOB: [birth year] DOA: 01/21/2021 PCP: Cassandria Anger, MD   Brief Narrative:  75 year old gentleman history of malignant melanoma, A. fib, hypertension, RA presented with cough, dyspnea from skilled nursing facility.  Patient tried on Tylenol and OTC cough medications but with no significant improvement.  Patient recently treated for cellulitis with Zyvox.  CT angiogram chest done with diffuse metastatic lesions noted in the lungs.  Patient noted to have diffuse wheezing.  RSV positive.  COVID-19 PCR was negative, influenza A and B negative.  Patient placed on IV steroids, scheduled nebs, supportive care.   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  Also concern for possible volume overload. -COVID-19 PCR negative.  Influenza A and B negative. -continue O2 for comfort purposes  -Appreciate assistance by palliative care, focus now on full comfort status -More lethargic today with periods of apnea. Terminal condition, anticipating hospital death  2.  Paroxysmal atrial fibrillation -Currently on half home regimen metoprolol for rate control. -Had been on Eliquis, now on comfort status  3.  Hypertension Blood pressure noted to be soft on admission.- -Blood pressure medications now held secondary to [place] status  4.  Anxiety -Continue Ativan as needed.  5.  Normocytic anemia -Patient remains hemodynamically stable at this time  6.  Metastatic  melanoma -CT angiogram chest concern for metastatic melanoma to the lungs. -Patient seen by oncology and patient at this time felt not a candidate for systemic chemotherapy. -Appreciate input by oncology.  Very poor prognosis with patient appearing worse today -Appreciate assistance by palliative care.  Focus is now full comfort  7.  Chronic pain -Cont analgesia as needed  8.  Rheumatoid arthritis -Stable at this time. -Continue with analgesia as needed  9.  Pressure injury, POA  10. End of Life Care. DNR, [place] assistance by Palliative Care -Wishes noted to be DNR, Full Comfort Care, anticipating hospital death  3. Hypoalbuminemia    DVT prophylaxis: Eliquis Code Status: DNR Family Communication: Pt in room, family currently at bedside  Status is: Inpatient  Remains inpatient appropriate because: Severity of illness   Consultants:  Palliative Care Oncology  Procedures:    Antimicrobials: Anti-infectives (From admission, onward)    Start     Dose/Rate Route Frequency Ordered Stop   02/04/2021 1430  azithromycin (ZITHROMAX) tablet 500 mg        500 mg Oral  Once 01/15/2021 1418 01/30/2021 1637       Subjective: Unable to assess given mentation, pt lethargic  Objective: Vitals:   02/10/21 0649 02/10/21 1300 02/10/21 2034 02/12/21 1210  BP:  133/70 (!) 152/79 111/61  Pulse:  87 (!) 108 (!) 113  Resp:  20 18 14   Temp:  98 F (36.7 C) 98.6 F (37 C) (!) 97.5 F (36.4 C)  TempSrc:  Axillary Oral   SpO2: 97% (!) 75% 93% 91%  Weight:      Height:        Intake/Output Summary (Last  24 hours) at 02/12/2021 1511 Last data filed at 02/12/2021 0735 Gross per 24 hour  Intake 10.22 ml  Output 1 ml  Net 9.22 ml    Filed Weights   02/04/2021 0930  Weight: 61.7 kg    Examination: General exam: Lethargic, laying in bed, in nad Respiratory system: Increased resp effort with periods of apnea   Data Reviewed: I have personally reviewed following  labs and imaging studies  CBC: Recent Labs  Lab 02/06/21 0438 02/07/21 0433  WBC 6.0 3.4*  NEUTROABS  --  3.0  HGB 8.9* 8.8*  HCT 29.4* 28.4*  MCV 96.1 95.3  PLT 147* 137*    Basic Metabolic Panel: Recent Labs  Lab 02/06/21 0438 02/07/21 0433  NA 135 138  K 4.4 4.2  CL 97* 99  CO2 32 37*  GLUCOSE 102* 159*  BUN 20 23  CREATININE 0.61 0.71  CALCIUM 8.1* 8.1*  MG  --  2.1    GFR: Estimated Creatinine Clearance: 71.8 mL/min (by C-G formula based on SCr of 0.71 mg/dL). Liver Function Tests: Recent Labs  Lab 02/06/21 0438  AST 23  ALT 20  ALKPHOS 43  BILITOT 0.5  PROT 5.4*  ALBUMIN 2.9*    No results for input(s): LIPASE, AMYLASE in the last 168 hours. No results for input(s): AMMONIA in the last 168 hours. Coagulation Profile: No results for input(s): INR, PROTIME in the last 168 hours.  Cardiac Enzymes: No results for input(s): CKTOTAL, CKMB, CKMBINDEX, TROPONINI in the last 168 hours. BNP (last 3 results) No results for input(s): PROBNP in the last 8760 hours. HbA1C: No results for input(s): HGBA1C in the last 72 hours. CBG: No results for input(s): GLUCAP in the last 168 hours. Lipid Profile: No results for input(s): CHOL, HDL, LDLCALC, TRIG, CHOLHDL, LDLDIRECT in the last 72 hours. Thyroid Function Tests: No results for input(s): TSH, T4TOTAL, FREET4, T3FREE, THYROIDAB in the last 72 hours. Anemia Panel: No results for input(s): VITAMINB12, FOLATE, FERRITIN, TIBC, IRON, RETICCTPCT in the last 72 hours.  Sepsis Labs: No results for input(s): PROCALCITON, LATICACIDVEN in the last 168 hours.   Recent Results (from the past 240 hour(s))  Resp Panel by RT-PCR (Flu A&B, Covid) Nasopharyngeal Swab     Status: None   Collection Time: 01/14/2021 10:29 AM   Specimen: Nasopharyngeal Swab; Nasopharyngeal(NP) swabs in vial transport medium  Result Value Ref Range Status   SARS Coronavirus 2 by RT PCR NEGATIVE NEGATIVE Final    Comment: (NOTE) SARS-CoV-2  target nucleic acids are NOT DETECTED.  The SARS-CoV-2 RNA is generally detectable in upper respiratory specimens during the acute phase of infection. The lowest concentration of SARS-CoV-2 viral copies this assay can detect is 138 copies/mL. A negative result does not preclude SARS-Cov-2 infection and should not be used as the sole basis for treatment or other patient management decisions. A negative result may occur with  improper specimen collection/handling, submission of specimen other than nasopharyngeal swab, presence of viral mutation(s) within the areas targeted by this assay, and inadequate number of viral copies(<138 copies/mL). A negative result must be combined with clinical observations, patient history, and epidemiological information. The expected result is Negative.  Fact Sheet for Patients:  EntrepreneurPulse.com.au  Fact Sheet for Healthcare Providers:  IncredibleEmployment.be  This test is no t yet approved or cleared by the [place] FDA and  has been authorized for detection and/or diagnosis of SARS-CoV-2 by FDA under an Emergency Use Authorization (EUA). This EUA will remain  in effect (  meaning this test can be used) for the duration of the COVID-19 declaration under Section 564(b)(1) of the Act, 21 U.S.C.section 360bbb-3(b)(1), unless the authorization is terminated  or revoked sooner.       Influenza A by PCR NEGATIVE NEGATIVE Final   Influenza B by PCR NEGATIVE NEGATIVE Final    Comment: (NOTE) The Xpert Xpress SARS-CoV-2/FLU/RSV plus assay is intended as an aid in the diagnosis of influenza from Nasopharyngeal swab specimens and should not be used as a sole basis for treatment. Nasal washings and aspirates are unacceptable for Xpert Xpress SARS-CoV-2/FLU/RSV testing.  Fact Sheet for Patients: EntrepreneurPulse.com.au  Fact Sheet for Healthcare  Providers: IncredibleEmployment.be  This test is not yet approved or cleared by the [place] FDA and has been authorized for detection and/or diagnosis of SARS-CoV-2 by FDA under an Emergency Use Authorization (EUA). This EUA will remain in effect (meaning this test can be used) for the duration of the COVID-19 declaration under Section 564(b)(1) of the Act, 21 U.S.C. section 360bbb-3(b)(1), unless the authorization is terminated or revoked.  Performed at [facility], [place] [address] [place], [place] [address]   Blood Culture (routine x 2)     Status: None   Collection Time: 01/06/2021 10:29 AM   Specimen: BLOOD LEFT FOREARM  Result Value Ref Range Status   Specimen Description   Final    BLOOD LEFT FOREARM Performed at [facility] [address], [place], [place] [address]    Special Requests   Final    BOTTLES DRAWN AEROBIC AND ANAEROBIC Blood Culture results may not be optimal due to an inadequate volume of blood received in culture bottles Performed at [facility] [address] [place], [place] [address]    Culture   Final    NO GROWTH 5 DAYS Performed at [facility], [place] [address], [place], [place] [address]    Report Status 02/10/2021 FINAL  Final  Blood Culture (routine x 2)     Status: None   Collection Time: 01/22/2021 10:29 AM   Specimen: BLOOD  Result Value Ref Range Status   Specimen Description   Final    BLOOD RIGHT ANTECUBITAL Performed at [facility] [address] [place], [place] [address]    Special Requests   Final    BOTTLES DRAWN AEROBIC AND ANAEROBIC Blood Culture results may not be optimal due to an inadequate volume of blood received in culture bottles Performed at [facility] [address], [place], [place] [address]    Culture   Final    NO GROWTH 5 DAYS Performed at [facility], [place] [address],  [place],  [address]    Report Status 02/10/2021 FINAL  Final  Respiratory (~20 pathogens) panel by PCR     Status: Abnormal   Collection Time: 01/21/2021  9:11 PM   Specimen: Nasopharyngeal Swab; Respiratory  Result Value Ref Range Status   Adenovirus NOT DETECTED NOT DETECTED Final   Coronavirus 229E NOT DETECTED NOT DETECTED Final    Comment: (NOTE) The Coronavirus on the Respiratory Panel, DOES NOT test for the novel  Coronavirus (2019 nCoV)    Coronavirus HKU1 NOT DETECTED NOT DETECTED Final   Coronavirus NL63 NOT DETECTED NOT DETECTED Final   Coronavirus OC43 NOT DETECTED NOT DETECTED Final   Metapneumovirus NOT DETECTED NOT DETECTED Final   Rhinovirus / Enterovirus NOT DETECTED NOT DETECTED Final   Influenza A NOT DETECTED NOT  DETECTED Final   Influenza B NOT DETECTED NOT DETECTED Final   Parainfluenza Virus 1 NOT DETECTED NOT DETECTED Final   Parainfluenza Virus 2 NOT DETECTED NOT DETECTED Final   Parainfluenza Virus 3 NOT DETECTED NOT DETECTED Final   Parainfluenza Virus 4 NOT DETECTED NOT DETECTED Final   Respiratory Syncytial Virus DETECTED (A) NOT DETECTED Final   Bordetella pertussis NOT DETECTED NOT DETECTED Final   Bordetella Parapertussis NOT DETECTED NOT DETECTED Final   Chlamydophila pneumoniae NOT DETECTED NOT DETECTED Final   Mycoplasma pneumoniae NOT DETECTED NOT DETECTED Final    Comment: Performed at [facility], [place] [address], [place], [place] [address]      Radiology Studies: No results found.  Scheduled Meds:  collagenase   Topical Daily   fentaNYL  1 patch Transdermal Q72H   Continuous Infusions:  HYDROmorphone 0.5 mg/hr (02/12/21 0735)     LOS: 7 days   Marylu Lund, MD [facility] Pager On Amion  If 7PM-7AM, please contact night-coverage 02/12/2021, 3:11 PM

## 2021-02-13 DIAGNOSIS — Z515 Encounter for palliative care: Secondary | ICD-10-CM | POA: Diagnosis not present

## 2021-02-13 DIAGNOSIS — C7802 Secondary malignant neoplasm of left lung: Secondary | ICD-10-CM | POA: Diagnosis not present

## 2021-02-13 DIAGNOSIS — C7801 Secondary malignant neoplasm of right lung: Secondary | ICD-10-CM | POA: Diagnosis not present

## 2021-02-13 DIAGNOSIS — J45901 Unspecified asthma with (acute) exacerbation: Secondary | ICD-10-CM | POA: Diagnosis not present

## 2021-02-13 DIAGNOSIS — J441 Chronic obstructive pulmonary disease with (acute) exacerbation: Secondary | ICD-10-CM | POA: Diagnosis not present

## 2021-02-13 DIAGNOSIS — I10 Essential (primary) hypertension: Secondary | ICD-10-CM | POA: Diagnosis not present

## 2021-02-13 NOTE — Progress Notes (Signed)
Daily Progress Note   Patient Name: [name]       Date: 02/13/2021 DOB: 21-Mar-1946  Age: 75 y.o. MRN#: [id number] Attending Physician: Donne Hazel, MD Primary Care Physician: Cassandria Anger, MD Admit Date: 01/24/2021  Reason for Consultation/Follow-up: Establishing goals of care  Subjective: I saw and examined [name] today.  He has coarse breath sounds, both UE with more edema and erythema, wife at bedside  Length of Stay: 8  Current Medications: Scheduled Meds:   collagenase   Topical Daily   fentaNYL  1 patch Transdermal Q72H    Continuous Infusions:  HYDROmorphone 0.5 mg/hr (02/12/21 1814)  Now at 1 mg/hour.   PRN Meds: ALPRAZolam **OR** LORazepam, glycopyrrolate, HYDROcodone bit-homatropine, HYDROmorphone, ipratropium-albuterol, lip balm, sodium chloride flush  Physical Exam         General: Alert, awake, some excess secretions.  Chronically ill-appearing.  Weak and frail HEENT: No bruits, no goiter, no JVD Heart: Regular rate and rhythm. No murmur appreciated. Lungs: Fair air movement Abdomen: Soft, nontender, nondistended, positive bowel sounds.   Ext: No significant edema Skin: Warm and dry Neuro: Grossly intact, nonfocal.  Vital Signs: BP 117/69 (BP Location: Right Arm)   Pulse (!) 117   Temp 98.4 F (36.9 C) (Oral)   Resp (!) 9   Ht 5' 6"  (1.676 m)   Wt 61.7 kg   SpO2 92%   BMI 21.95 kg/m  SpO2: SpO2: 92 % O2 Device: O2 Device: Nasal Cannula O2 Flow Rate: O2 Flow Rate (L/min): 3 L/min  Intake/output summary:  Intake/Output Summary (Last 24 hours) at 02/13/2021 1502 Last data filed at 02/13/2021 1400 Gross per 24 hour  Intake 30.38 ml  Output 400 ml  Net -369.62 ml    LBM: Last BM Date: 02/11/21 Baseline Weight: Weight:  61.7 kg Most recent weight: Weight: 61.7 kg       Palliative Assessment/Data:    Flowsheet Rows    Flowsheet Row Most Recent Value  Intake Tab   Referral Department Hospitalist  Unit at Time of Referral Med/Surg Unit  Palliative Care Primary Diagnosis Cancer  Date Notified 02/02/2021  Palliative Care Type New Palliative care  Reason for referral Clarify Goals of Care  Date of Admission 01/22/2021  Date first seen by Palliative Care 02/06/21  # of days Palliative  referral response time 1 Day(s)  # of days IP prior to Palliative referral 0  Clinical Assessment   Palliative Performance Scale Score 40%  Psychosocial & Spiritual Assessment   Palliative Care Outcomes   Patient/Family meeting held? Yes  Who was at the meeting? 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 ([place]) 02/06/2021   Malignant neoplasm metastatic to both lungs (HCC)    Acute respiratory failure ([place]) 01/21/2021   Palliative care by specialist    General weakness    Sepsis ([place]) 01/16/2021   Sepsis due to cellulitis ([place]) 01/15/2021   Prolonged QT interval 01/15/2021   Pulmonary nodules 01/15/2021   AKI (acute kidney injury) ([place]) 01/15/2021   Open thigh wound    Citrobacter infection    Pseudomonas infection    Pressure injury of skin 12/21/2020   Fall    Hyperbilirubinemia    Malignant melanoma ([place])    Cellulitis 12/20/2020   Blisters of multiple sites 11/05/2020   Goals of care, counseling/discussion 10/12/2020   Atherosclerosis of aorta ([place]) 07/24/2020   Symptomatic anemia 05/27/2020   Pulmonary embolus ([place]) 05/27/2020   Acute respiratory failure with hypoxia ([place]) 05/26/2020   Callus of foot 04/21/2018   Edema 03/27/2018   Toxic gastroenteritis and colitis 03/25/2018   Other ulcerative 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 ([place]) 88/89/1694   Diarrhea 12/23/2017   Nausea & vomiting 12/23/2017    Herpes zoster 07/07/2017   Constipation 04/21/2017   Genetic testing 03/06/2017   Family history of cancer    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[place]) 02/12/2017   Iron deficiency anemia due to chronic blood loss 12/27/2016   Scalp wound 06/13/2016   Scalp laceration, subsequent encounter 06/04/2016   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   Pain in joint, ankle and foot 09/28/2015   Anemia due to chronic blood loss 09/28/2015   Hyponatremia 09/28/2015   Cramp in limb 09/28/2015   Exertional angina ([place]) 06/17/2014   DOE (dyspnea on exertion) 04/28/2014   Hypokalemia 03/11/2014   Counseling regarding goals of care 12/31/2013   Impaired glucose tolerance 12/31/2013   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[place]) 11/18/2013   Fever 11/18/2013   Leukocytosis 10/30/2013   Fatigue 10/22/2013   Hoarseness of voice 08/10/2013   Sleep disorder 07/05/2013   Tension headache 07/01/2013   Polypharmacy 06/19/2013   Lumbar post-laminectomy syndrome 05/27/2013   Degenerative arthritis of spine 05/26/2013   Hemochromatosis 05/26/2013   Wound infection after surgery 05/26/2013   Normocytic anemia 05/26/2013   Unintentional weight loss 05/26/2013   Protein-calorie malnutrition, severe ([place]) 05/25/2013   Chronic pain syndrome 05/23/2013   COPD (chronic obstructive pulmonary disease) ([place]) 05/06/2013   Frequent falls 05/06/2013   Encounter for postoperative wound care 05/06/2013   Dehydration 03/17/2013   Atrial fibrillation ([place]) 08/07/2010   Rheumatoid arthritis ([place]) 10/18/2008   Anxiety 11/09/2007   Elevated lipids 03/30/2007   PSEUDOGOUT 03/30/2007   Essential hypertension 03/30/2007   Coronary atherosclerosis 03/30/2007    Palliative Care Assessment & Plan   Patient Profile: 75 y.o. male  with past medical history of malignant melanoma, A. fib, hypertension, RA admitted on 01/24/2021 with worsening cough and shortness of breath from skilled facility.  CT angiogram shows diffuse metastatic  lesions in the lungs.  He is also RSV positive.  Dr. Marin Olp has evaluated patient and recommendation for hospice.  Palliative consulted for goals of care/facilitation  of home hospice.   Recommendations/Plan: DNR/DNI Continue comfort care Prognosis hours to days Anticipated hospital death.  Pain/shortness of breath: Continue Dilaudid infusion with additional Dilaudid as needed for breakthrough symptoms Anxiety: Continue Xanax (or Ativan if he cannot swallow) as needed  Goals of Care and Additional Recommendations: Limitations on Scope of Treatment: Full Comfort Care  Code Status:    Code Status Orders  (From admission, onward)           Start     Ordered   02/01/2021 1851  Do not attempt resuscitation (DNR)  Continuous       Question Answer Comment  In the event of cardiac or respiratory ARREST Do not call a "code blue"   In the event of cardiac or respiratory ARREST Do not perform Intubation, CPR, defibrillation or ACLS   In the event of cardiac or respiratory ARREST Use medication by any route, position, wound care, and other measures to relive pain and suffering. May use oxygen, suction and manual treatment of airway obstruction as needed for comfort.   Comments DNR confirmed with patient      01/06/2021 1850           Code Status History     Date Active Date Inactive Code Status Order ID Comments User Context   01/15/2021 1556 01/25/2021 0221 DNR [id number]  Reubin [place], MD ED   12/20/2020 1800 12/27/2020 1732 DNR [id number]  Jonnie Finner, DO Inpatient   05/27/2020 0125 05/29/2020 1847 DNR [id number]  Lenore Cordia, MD Inpatient   02/12/2017 1815 02/13/2017 1604 Full Code [id number]  Stark Klein, MD Inpatient   06/17/2014 0948 06/18/2014 1334 Full Code [id number]  Sherren Mocha, MD Inpatient   10/30/2013 0052 11/01/2013 1341 Full Code [id number]  Shanda Howells, MD ED   06/16/2013 2154 06/19/2013 1600 Full Code [id number]  Merton Border, MD Inpatient   06/08/2013 1534  06/10/2013 1911 Full Code [id number]  Luanne Bras, MD Inpatient   05/27/2013 1642 06/08/2013 1534 Full Code [id number]  Cathlyn Parsons, PA-C Inpatient   05/27/2013 1642 05/27/2013 1642 Full Code [id number]  Cathlyn Parsons, PA-C Inpatient   05/23/2013 2239 05/27/2013 1642 Full Code [id number]  Jonetta Osgood, MD Inpatient   05/06/2013 1552 05/09/2013 1603 Full Code [id number]  Robbie Lis, MD ED   04/13/2013 1713 04/24/2013 2037 Full Code [id number]  Orvan Falconer, MD Inpatient   03/16/2013 1650 04/06/2013 1626 Full Code [id number]  Floyce Stakes, MD Inpatient   03/02/2013 1741 03/10/2013 2021 Full Code [id number]  Floyce Stakes, MD Inpatient      Advance Directive Documentation    Flowsheet Row Most Recent Value  Type of Advance Directive Healthcare Power of Attorney  Pre-existing out of facility DNR order (yellow form or pink MOST form) --  "MOST" Form in Place? --       Prognosis: Likely hours to days  Discharge Planning: Anticipated Hospital Death  Care plan was discussed with patient's wife Appreciate chaplain support.   Thank you for allowing the Palliative Medicine Team to assist in the care of this patient.   Total Time 25 Prolonged Time Billed No  Greater than 50%  of this time was spent counseling and coordinating care related to the above assessment and plan.    Loistine Chance, MD  Please contact Palliative Medicine Team phone at [phone] for questions and concerns from 7 AM to 7 PM only, after 7 PM, please call primary service.

## 2021-02-13 NOTE — Care Management Important Message (Signed)
Important Message  Patient Details IM Letter placed in Patients room. Name: [name] MRN: [id number] Date of Birth: [birth year]   Medicare Important Message Given:  Yes     Kerin Salen 02/13/2021, 10:35 AM

## 2021-02-13 NOTE — TOC Progression Note (Signed)
Transition of Care [facility]) - Progression Note    Patient Details  Name: [name] MRN: [id number] Date of Birth: [birth year]  [facility] CM/SW Contact  Christeena Krogh, Juliann Pulse, RN Phone Number: 02/13/2021, 9:32 AM  Clinical Narrative: Noted anticipating hospital death. On dilaudid infusion.DNR.      Expected Discharge Plan: [place] Barriers to Discharge: Continued Medical Work up  Expected Discharge Plan and Services Expected Discharge Plan: [place]   Discharge Planning Services: CM Consult Post Acute Care Choice: Hospice Living arrangements for the past 2 months: [place] Expected Discharge Date:  (unknown)                                     Social Determinants of Health (SDOH) Interventions    Readmission Risk Interventions Readmission Risk Prevention Plan 01/17/2021  Transportation Screening Complete  HRI or Home Care Consult Complete  SW Recovery Care/Counseling Consult Complete  Palliative Care Screening (No Data)  Some recent data might be hidden

## 2021-02-13 NOTE — Progress Notes (Signed)
Nutrition Brief Note  Patient with MST of 2 completed 11/7 evening. Chart reviewed. Patient is DNR and now transitioning to comfort care. MD note from 11/7 states EOL care and anticipation of hospital death.  No further nutrition interventions planned at this time.  Please consult as needed.       Jarome Matin, MS, RD, LDN, CNSC Inpatient Clinical Dietitian RD pager # available in [place]  After hours/weekend pager # available in [place]

## 2021-02-13 NOTE — Progress Notes (Signed)
Chaplain continued to provide support throughout the day to [name] and his family.  Chaplain sat with [name] for a time so that [name] could take a short break.  She did not want to leave him alone.  Chaplain provided ministry of presence and reflective listening.  [employer], Bcc Pager, [phone] 2:26 PM

## 2021-02-13 NOTE — Plan of Care (Signed)
  Problem: Health Behavior/Discharge Planning: Goal: Ability to manage health-related needs will improve Outcome: Not Applicable   Problem: Clinical Measurements: Goal: Ability to maintain clinical measurements within normal limits will improve Outcome: Not Applicable Goal: Will remain free from infection Outcome: Not Applicable Goal: Diagnostic test results will improve Outcome: Not Applicable

## 2021-02-13 NOTE — Progress Notes (Signed)
PROGRESS NOTE    [name]  SJG:[id number] DOB: 07/13/1945 DOA: 01/08/2021 PCP: Cassandria Anger, MD   Brief Narrative:  75 year old gentleman history of malignant melanoma, A. fib, hypertension, RA presented with cough, dyspnea from skilled nursing facility.  Patient tried on Tylenol and OTC cough medications but with no significant improvement.  Patient recently treated for cellulitis with Zyvox.  CT angiogram chest done with diffuse metastatic lesions noted in the lungs.  Patient noted to have diffuse wheezing.  RSV positive.  COVID-19 PCR was negative, influenza A and B negative.  Patient placed on IV steroids, scheduled nebs, supportive care.   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  Also concern for possible volume overload. -COVID-19 PCR negative.  Influenza A and B negative. -continue O2 for comfort purposes  -Appreciate assistance by palliative care, focus now on full comfort status -Awake, but poorly interactive. Periods of apnea. Terminal condition, anticipating hospital death  2.  Paroxysmal atrial fibrillation -Currently on half home regimen metoprolol for rate control. -Had been on Eliquis, now on comfort status  3.  Hypertension Blood pressure noted to be soft on admission.- -Blood pressure medications now held secondary to [place] status  4.  Anxiety -Continue Ativan as needed.  5.  Normocytic anemia -Patient remains hemodynamically stable at this time  6.   Metastatic melanoma -CT angiogram chest concern for metastatic melanoma to the lungs. -Patient seen by oncology and patient at this time felt not a candidate for systemic chemotherapy. -Appreciate input by oncology.  Very poor prognosis with patient appearing worse today -Appreciate assistance by palliative care.  Focus is now full comfort  7.  Chronic pain -Cont analgesia as needed  8.  Rheumatoid arthritis -Stable at this time. -Continue with analgesia as needed  9.  Pressure injury, POA  10. End of Life Care. DNR, [place] assistance by Palliative Care -Wishes noted to be DNR, Full Comfort Care, anticipating hospital death  57. Hypoalbuminemia    DVT prophylaxis: Eliquis Code Status: DNR Family Communication: Pt in room, family currently at bedside  Status is: Inpatient  Remains inpatient appropriate because: Severity of illness   Consultants:  Palliative Care Oncology  Procedures:    Antimicrobials: Anti-infectives (From admission, onward)    Start     Dose/Rate Route Frequency Ordered Stop   02/03/2021 1430  azithromycin (ZITHROMAX) tablet 500 mg        500 mg Oral  Once 01/29/2021 1418 01/30/2021 1637       Subjective: Cannot assess given mentation  Objective: Vitals:   02/10/21 1300 02/10/21 2034 02/12/21 1210 02/12/21 2023  BP: 133/70 (!) 152/79 111/61 117/69  Pulse: 87 (!) 108 (!) 113 (!) 117  Resp: 20 18 14  (!) 9  Temp: 98 F (36.7 C) 98.6 F (37 C) (!) 97.5 F (36.4 C) 98.4 F (36.9 C)  TempSrc: Axillary Oral  Oral  SpO2: (!) 75% 93% 91% 92%  Weight:      Height:        Intake/Output  Summary (Last 24 hours) at 02/13/2021 1612 Last data filed at 02/13/2021 1400 Gross per 24 hour  Intake 30.38 ml  Output 400 ml  Net -369.62 ml    Filed Weights   01/17/2021 0930  Weight: 61.7 kg    Examination: General exam: Not conversant, in no acute distress Respiratory system: periods of apnea, increased resp effort, no audible  wheezing  Data Reviewed: I have personally reviewed following labs and imaging studies  CBC: Recent Labs  Lab 02/07/21 0433  WBC 3.4*  NEUTROABS 3.0  HGB 8.8*  HCT 28.4*  MCV 95.3  PLT 137*    Basic Metabolic Panel: Recent Labs  Lab 02/07/21 0433  NA 138  K 4.2  CL 99  CO2 37*  GLUCOSE 159*  BUN 23  CREATININE 0.71  CALCIUM 8.1*  MG 2.1    GFR: Estimated Creatinine Clearance: 71.8 mL/min (by C-G formula based on SCr of 0.71 mg/dL). Liver Function Tests: No results for input(s): AST, ALT, ALKPHOS, BILITOT, PROT, ALBUMIN in the last 168 hours.  No results for input(s): LIPASE, AMYLASE in the last 168 hours. No results for input(s): AMMONIA in the last 168 hours. Coagulation Profile: No results for input(s): INR, PROTIME in the last 168 hours.  Cardiac Enzymes: No results for input(s): CKTOTAL, CKMB, CKMBINDEX, TROPONINI in the last 168 hours. BNP (last 3 results) No results for input(s): PROBNP in the last 8760 hours. HbA1C: No results for input(s): HGBA1C in the last 72 hours. CBG: No results for input(s): GLUCAP in the last 168 hours. Lipid Profile: No results for input(s): CHOL, HDL, LDLCALC, TRIG, CHOLHDL, LDLDIRECT in the last 72 hours. Thyroid Function Tests: No results for input(s): TSH, T4TOTAL, FREET4, T3FREE, THYROIDAB in the last 72 hours. Anemia Panel: No results for input(s): VITAMINB12, FOLATE, FERRITIN, TIBC, IRON, RETICCTPCT in the last 72 hours.  Sepsis Labs: No results for input(s): PROCALCITON, LATICACIDVEN in the last 168 hours.   Recent Results (from the past 240 hour(s))  Resp Panel by RT-PCR (Flu A&B, Covid) Nasopharyngeal Swab     Status: None   Collection Time: 01/12/2021 10:29 AM   Specimen: Nasopharyngeal Swab; Nasopharyngeal(NP) swabs in vial transport medium  Result Value Ref Range Status   SARS Coronavirus 2 by RT PCR NEGATIVE NEGATIVE Final    Comment: (NOTE) SARS-CoV-2 target nucleic acids are NOT DETECTED.  The  SARS-CoV-2 RNA is generally detectable in upper respiratory specimens during the acute phase of infection. The lowest concentration of SARS-CoV-2 viral copies this assay can detect is 138 copies/mL. A negative result does not preclude SARS-Cov-2 infection and should not be used as the sole basis for treatment or other patient management decisions. A negative result may occur with  improper specimen collection/handling, submission of specimen other than nasopharyngeal swab, presence of viral mutation(s) within the areas targeted by this assay, and inadequate number of viral copies(<138 copies/mL). A negative result must be combined with clinical observations, patient history, and epidemiological information. The expected result is Negative.  Fact Sheet for Patients:  EntrepreneurPulse.com.au  Fact Sheet for Healthcare Providers:  IncredibleEmployment.be  This test is no t yet approved or cleared by the [place] FDA and  has been authorized for detection and/or diagnosis of SARS-CoV-2 by FDA under an Emergency Use Authorization (EUA). This EUA will remain  in effect (meaning this test can be used) for the duration of the COVID-19 declaration under Section 564(b)(1) of the Act, 21 U.S.C.section 360bbb-3(b)(1), unless the authorization is terminated  or revoked sooner.  Influenza A by PCR NEGATIVE NEGATIVE Final   Influenza B by PCR NEGATIVE NEGATIVE Final    Comment: (NOTE) The Xpert Xpress SARS-CoV-2/FLU/RSV plus assay is intended as an aid in the diagnosis of influenza from Nasopharyngeal swab specimens and should not be used as a sole basis for treatment. Nasal washings and aspirates are unacceptable for Xpert Xpress SARS-CoV-2/FLU/RSV testing.  Fact Sheet for Patients: EntrepreneurPulse.com.au  Fact Sheet for Healthcare Providers: IncredibleEmployment.be  This test is not yet approved or  cleared by the [place] FDA and has been authorized for detection and/or diagnosis of SARS-CoV-2 by FDA under an Emergency Use Authorization (EUA). This EUA will remain in effect (meaning this test can be used) for the duration of the COVID-19 declaration under Section 564(b)(1) of the Act, 21 U.S.C. section 360bbb-3(b)(1), unless the authorization is terminated or revoked.  Performed at [facility], [place] [address] [place], [place] [address]   Blood Culture (routine x 2)     Status: None   Collection Time: 01/15/2021 10:29 AM   Specimen: BLOOD LEFT FOREARM  Result Value Ref Range Status   Specimen Description   Final    BLOOD LEFT FOREARM Performed at [facility] [address], [place], [place] [address]    Special Requests   Final    BOTTLES DRAWN AEROBIC AND ANAEROBIC Blood Culture results may not be optimal due to an inadequate volume of blood received in culture bottles Performed at [facility] [address] [place], [place] [address]    Culture   Final    NO GROWTH 5 DAYS Performed at [facility], [place] [address] [place], [place] [address]    Report Status 02/10/2021 FINAL  Final  Blood Culture (routine x 2)     Status: None   Collection Time: 01/10/2021 10:29 AM   Specimen: BLOOD  Result Value Ref Range Status   Specimen Description   Final    BLOOD RIGHT ANTECUBITAL Performed at [facility] [address], [place], [place] [address]    Special Requests   Final    BOTTLES DRAWN AEROBIC AND ANAEROBIC Blood Culture results may not be optimal due to an inadequate volume of blood received in culture bottles Performed at [facility] [address] [place], [place] [address]    Culture   Final    NO GROWTH 5 DAYS Performed at [facility], [place] [address], [place],  [address]    Report Status 02/10/2021 FINAL  Final  Respiratory (~20 pathogens)  panel by PCR     Status: Abnormal   Collection Time: 01/11/2021  9:11 PM   Specimen: Nasopharyngeal Swab; Respiratory  Result Value Ref Range Status   Adenovirus NOT DETECTED NOT DETECTED Final   Coronavirus 229E NOT DETECTED NOT DETECTED Final    Comment: (NOTE) The Coronavirus on the Respiratory Panel, DOES NOT test for the novel  Coronavirus (2019 nCoV)    Coronavirus HKU1 NOT DETECTED NOT DETECTED Final   Coronavirus NL63 NOT DETECTED NOT DETECTED Final   Coronavirus OC43 NOT DETECTED NOT DETECTED Final   Metapneumovirus NOT DETECTED NOT DETECTED Final   Rhinovirus / Enterovirus NOT DETECTED NOT DETECTED Final   Influenza A NOT DETECTED NOT DETECTED Final   Influenza B NOT DETECTED NOT DETECTED Final   Parainfluenza Virus 1 NOT DETECTED NOT DETECTED Final   Parainfluenza Virus 2 NOT DETECTED NOT DETECTED Final   Parainfluenza Virus 3 NOT  DETECTED NOT DETECTED Final   Parainfluenza Virus 4 NOT DETECTED NOT DETECTED Final   Respiratory Syncytial Virus DETECTED (A) NOT DETECTED Final   Bordetella pertussis NOT DETECTED NOT DETECTED Final   Bordetella Parapertussis NOT DETECTED NOT DETECTED Final   Chlamydophila pneumoniae NOT DETECTED NOT DETECTED Final   Mycoplasma pneumoniae NOT DETECTED NOT DETECTED Final    Comment: Performed at [facility], [place] [address], [place], [place] [address]      Radiology Studies: No results found.  Scheduled Meds:  collagenase   Topical Daily   fentaNYL  1 patch Transdermal Q72H   Continuous Infusions:  HYDROmorphone 0.5 mg/hr (02/12/21 1814)     LOS: 8 days   Marylu Lund, MD [facility] Pager On Amion  If 7PM-7AM, please contact night-coverage 02/13/2021, 4:12 PM

## 2021-02-14 MED ORDER — ACETAMINOPHEN 325 MG PO TABS: 650.0000 mg | ORAL_TABLET | Freq: Four times a day (QID) | ORAL | Status: DC | PRN

## 2021-02-14 MED ORDER — POLYVINYL ALCOHOL 1.4 % OP SOLN: 1.0000 [drp] | Freq: Four times a day (QID) | OPHTHALMIC | Status: DC | PRN

## 2021-02-14 MED ORDER — GLYCOPYRROLATE 0.2 MG/ML IJ SOLN
0.2000 mg | INTRAMUSCULAR | Status: DC | PRN
  Filled 2021-02-14: qty 1

## 2021-02-14 MED ORDER — ONDANSETRON 4 MG PO TBDP: 4.0000 mg | ORAL_TABLET | Freq: Four times a day (QID) | ORAL | Status: DC | PRN

## 2021-02-14 MED ORDER — HYDROMORPHONE BOLUS VIA INFUSION
0.5000 mg | INTRAVENOUS | Status: DC | PRN
  Administered 2021-02-14: 0.5 mg via INTRAVENOUS
  Filled 2021-02-14: qty 1

## 2021-02-14 MED ORDER — ONDANSETRON HCL 4 MG/2ML IJ SOLN: 4.0000 mg | Freq: Four times a day (QID) | INTRAMUSCULAR | Status: DC | PRN

## 2021-02-14 MED ORDER — HALOPERIDOL LACTATE 2 MG/ML PO CONC
0.5000 mg | ORAL | Status: DC | PRN
  Filled 2021-02-14: qty 0.3

## 2021-02-14 MED ORDER — GLYCOPYRROLATE 1 MG PO TABS
1.0000 mg | ORAL_TABLET | ORAL | Status: DC | PRN
  Filled 2021-02-14: qty 1

## 2021-02-14 MED ORDER — HALOPERIDOL 0.5 MG PO TABS
0.5000 mg | ORAL_TABLET | ORAL | Status: DC | PRN
  Filled 2021-02-14: qty 1

## 2021-02-14 MED ORDER — ATROPINE ORAL SOLUTION 0.08 MG/ML: 0.1600 mg | ORAL | Status: DC | PRN

## 2021-02-14 MED ORDER — ATROPINE SULFATE 1 % OP SOLN
1.0000 [drp] | OPHTHALMIC | Status: DC | PRN
  Administered 2021-02-14: 1 [drp] via SUBLINGUAL
  Filled 2021-02-14: qty 2

## 2021-02-14 MED ORDER — SODIUM CHLORIDE 0.9 % IV SOLN
1.0000 mg/h | INTRAVENOUS | Status: DC
  Administered 2021-02-14: 1 mg/h via INTRAVENOUS
  Filled 2021-02-14: qty 5

## 2021-02-14 MED ORDER — ACETAMINOPHEN 650 MG RE SUPP: 650.0000 mg | Freq: Four times a day (QID) | RECTAL | Status: DC | PRN

## 2021-02-14 MED ORDER — POLYVINYL ALCOHOL 1.4 % OP SOLN
2.0000 [drp] | OPHTHALMIC | Status: DC | PRN
  Filled 2021-02-14: qty 15

## 2021-02-14 MED ORDER — HALOPERIDOL LACTATE 5 MG/ML IJ SOLN: 0.5000 mg | INTRAMUSCULAR | Status: DC | PRN

## 2021-02-14 MED ORDER — BIOTENE DRY MOUTH MT LIQD: 15.0000 mL | OROMUCOSAL | Status: DC | PRN

## 2021-02-15 DIAGNOSIS — I480 Paroxysmal atrial fibrillation: Secondary | ICD-10-CM | POA: Diagnosis present

## 2021-02-15 DIAGNOSIS — Z66 Do not resuscitate: Secondary | ICD-10-CM | POA: Diagnosis present

## 2021-02-15 DIAGNOSIS — B338 Other specified viral diseases: Secondary | ICD-10-CM | POA: Diagnosis present

## 2021-03-08 ENCOUNTER — Ambulatory Visit: Payer: Self-pay | Admitting: Radiation Oncology

## 2021-03-08 NOTE — Death Summary Note (Addendum)
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 Secondary Diagnoses (including complications and co-morbidities):  Principal Problem:   Acute respiratory failure ([place]) Active Problems:   Essential hypertension   Rheumatoid arthritis (HCC)   Normocytic anemia   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HCC)   Symptomatic anemia   Goals of care, counseling/discussion   Malignant melanoma ([place])   Pulmonary nodules   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 Pressure Injury buttocks (Active)  02/11/21 1100  Location: Buttocks  Location Orientation: Mid  Staging: Stage 2 -  Partial thickness loss of dermis presenting as a shallow open injury with a red, pink wound bed without slough.  Wound Description (Comments): Pressure Injury buttocks  Present on Admission: No        Brief Hospital Course (including significant findings, care, treatment, and services provided and events leading to death)  [name] is a 75 y.o. year old male who has a history of malignant melanoma to lungs, paroxysmal defibrillation, rheumatoid arthritis, hypertension, was admitted to the hospital from skilled nursing facility with worsening shortness of breath.  CT angiogram of chest was done that showed diffuse  metastatic lesions noted in the lungs.  He was noted to have diffuse wheezing.  He tested positive for RSV.  The patient was placed on IV steroids, scheduled nebs and supportive care.  He was followed by his oncologist Dr. Marin Olp in the hospital.  He was also seen by palliative care for goals of care.  With his progressing malignancy, and lack of oncology treatment options, the patient elected to pursue a comfort care/hospice approach.  Unfortunately, his overall condition continued to decline while in the hospital.  He was placed on a Dilaudid infusion for respiratory distress.  The patient ended up passing away in the hospital.  His wife was at the bedside and offered support.    Pertinent Labs and Studies  Significant Diagnostic Studies CT Angio Chest PE W and/or Wo Contrast  Result Date: February 19, 2021 CLINICAL DATA:  Hypoxia.  History of recurrent metastatic melanoma. EXAM: CT ANGIOGRAPHY CHEST WITH CONTRAST TECHNIQUE: Multidetector CT imaging of the chest was performed using the standard protocol during bolus administration of intravenous contrast. Multiplanar CT image reconstructions and MIPs were obtained to evaluate the vascular anatomy. CONTRAST:  69m OMNIPAQUE IOHEXOL 350 MG/ML SOLN COMPARISON:  Prior CTA of the chest on 08/03/2020 FINDINGS: Cardiovascular: The pulmonary arteries are adequately opacified to the subsegmental level. There is no evidence of acute pulmonary embolism. Stable dilated central pulmonary arteries with the main pulmonary artery measuring up to 4.3 cm in diameter. The heart size is stable. No pericardial fluid identified. Stable extensive coronary atherosclerosis with evidence of prior coronary artery stent placement. The thoracic aorta demonstrates atherosclerosis without aneurysmal disease. Mediastinum/Nodes: No enlarged mediastinal, hilar,  or axillary lymph nodes. Thyroid gland, trachea, and esophagus demonstrate no significant findings. Lungs/Pleura: Since the prior CT  study, there are innumerable new pulmonary nodules throughout all lobes of both lungs. The most numerous nodules are in the upper lobes bilaterally with chronic volume loss present in the lower lobes bilaterally and increased atelectasis of the right lower lobe. Largest nodule diameter is approximately 7-8 mm in the left upper lobe. Findings are consistent with progressive pulmonary metastatic disease. No associated pleural fluid or pneumothorax. No pulmonary edema. Upper Abdomen: No visible metastatic disease in the visualized upper abdomen. Musculoskeletal: No visualized bony lesions or fractures. Stable diffuse degenerative disc disease of the thoracic spine. Review of the MIP images confirms the above findings. IMPRESSION: 1. No evidence of pulmonary embolism. 2. Innumerable new small pulmonary nodules throughout both lungs which were not present on the April CTA. Findings are consistent with progressive metastatic disease, most likely related to the patient's known history of metastatic/recurrent malignant melanoma. 3. Increased atelectasis of the right lower lobe. 4. Stable dilated central pulmonary arteries consistent with component underlying pulmonary hypertension. Electronically Signed   By: Aletta Edouard M.D.   On: 01/17/2021 13:12   DG Chest Port 1 View  Result Date: 01/12/2021 CLINICAL DATA:  Cough, shortness of breath. EXAM: PORTABLE CHEST 1 VIEW COMPARISON:  January 16, 2021. FINDINGS: Stable cardiomediastinal silhouette. Status post bilateral shoulder arthroplasties. Left subclavian Port-A-Cath is unchanged in position. Stable elevated right hemidiaphragm is noted. Stable bibasilar opacities are noted concerning for edema or infiltrates with possible small pleural effusions. IMPRESSION: Stable bibasilar opacities as described above. Aortic Atherosclerosis (ICD10-I70.0). Electronically Signed   By: Marijo Conception M.D.   On: 01/19/2021 10:11   VAS [place] UPPER EXTREMITY VENOUS DUPLEX  Result  Date: 01/19/2021 UPPER VENOUS STUDY  Patient Name:  [name]  Date of Exam:   01/18/2021 Medical Rec #: [id number]           Accession #:    [id number] Date of Birth: December 28, 1945           Patient Gender: M Patient Age:   74 years Exam Location:  [facility] Procedure:      VAS [place] UPPER EXTREMITY VENOUS DUPLEX Referring Phys: Aline August --------------------------------------------------------------------------------  Indications: Edema Limitations: Body habitus, poor ultrasound/tissue interface and restricted mobility. Comparison Study: No prior study Performing Technologist: Maudry Mayhew MHA, RDMS, RVT, RDCS  Examination Guidelines: A complete evaluation includes B-mode imaging, spectral Doppler, color Doppler, and power Doppler as needed of all accessible portions of each vessel. Bilateral testing is considered an integral part of a complete examination. Limited examinations for reoccurring indications may be performed as noted.  Right Findings: +----------+------------+---------+-----------+----------+--------------+ RIGHT     CompressiblePhasicitySpontaneousProperties   Summary     +----------+------------+---------+-----------+----------+--------------+ Subclavian                                          Not visualized +----------+------------+---------+-----------+----------+--------------+  Left Findings: +----------+------------+---------+-----------+----------+-------+ LEFT      CompressiblePhasicitySpontaneousPropertiesSummary +----------+------------+---------+-----------+----------+-------+ IJV           Full       Yes       Yes                      +----------+------------+---------+-----------+----------+-------+ Subclavian    Full       Yes  Yes                      +----------+------------+---------+-----------+----------+-------+ Axillary      Full       Yes       Yes                       +----------+------------+---------+-----------+----------+-------+ Brachial      Full       Yes       Yes                      +----------+------------+---------+-----------+----------+-------+ Radial        Full                                          +----------+------------+---------+-----------+----------+-------+ Ulnar         Full                                          +----------+------------+---------+-----------+----------+-------+ Cephalic      Full                                          +----------+------------+---------+-----------+----------+-------+ Basilic       Full                                          +----------+------------+---------+-----------+----------+-------+  Summary:  Left: No evidence of deep vein thrombosis in the upper extremity. No evidence of superficial vein thrombosis in the upper extremity.  *See table(s) above for measurements and observations.  Diagnosing physician: Orlie Pollen Electronically signed by Orlie Pollen on 01/19/2021 at 8:18:06 AM.    Final     Microbiology No results found for this or any previous visit (from the past 240 hour(s)).  Lab Basic Metabolic Panel: No results for input(s): NA, K, CL, CO2, GLUCOSE, BUN, CREATININE, CALCIUM, MG, PHOS in the last 168 hours. Liver Function Tests: No results for input(s): AST, ALT, ALKPHOS, BILITOT, PROT, ALBUMIN in the last 168 hours. No results for input(s): LIPASE, AMYLASE in the last 168 hours. No results for input(s): AMMONIA in the last 168 hours. CBC: No results for input(s): WBC, NEUTROABS, HGB, HCT, MCV, PLT in the last 168 hours. Cardiac Enzymes: No results for input(s): CKTOTAL, CKMB, CKMBINDEX, TROPONINI in the last 168 hours. Sepsis Labs: No results for input(s): PROCALCITON, WBC, LATICACIDVEN in the last 168 hours.  Procedures/Operations     [facility] 02/15/2021, 9:08 PM

## 2021-03-08 NOTE — Progress Notes (Signed)
RN entered patient room at 1548 to find patient not breathing. RN notified CN. Patient significant other brought into room from where she was elsewhere on the hallway. MD notified by CN.  RN and Russ Halo, RN pronounced death at 1551 per MD order.  RN and NT prepared body and transported to [employer].  All flowsheet information completed.

## 2021-03-08 NOTE — Progress Notes (Signed)
Chaplain provided ministry of presence throughout the day and accompanied [name] downstairs to meet her sister after [name] had died.  [place], [place] Pager, [phone] 5:37 PM

## 2021-03-08 NOTE — Progress Notes (Signed)
10 mL of Dilaudid drip left in IV bag. RN wasted in stericycle with Elzie Rings, RN as witness.

## 2021-03-08 NOTE — Progress Notes (Signed)
I cannot imagine that he is going to survive more than a day at this point.  It sounds like he has secretions.  We may need to think about some oral atropine drops to try to help dry up the secretions.  He is not responding to me.  I am surprised that he is lasted this long.  He seemed comfortable.  There is no obvious dyspnea.  His "wife" was not in the room when I was there this morning.  Again, he appears comfortable.  He has always told me that he did not want to exist and last long.  I do appreciate all the great care that he is got from the staff of [facility].  [name], MD  2 Timothy 4:16-18

## 2021-03-08 DEATH — deceased

## 2021-03-09 ENCOUNTER — Ambulatory Visit: Payer: Medicare Other | Admitting: Cardiovascular Disease

## 2021-03-14 ENCOUNTER — Ambulatory Visit: Payer: Medicare Other | Admitting: Internal Medicine

## 2021-04-08 DEATH — deceased
